# Patient Record
Sex: Female | Born: 1950 | Race: Black or African American | Hispanic: No | Marital: Single | Smoking: Former smoker
Health system: Southern US, Community
[De-identification: ages and names within clinical notes are randomized; demographics above are authoritative.]

## PROBLEM LIST (undated history)

## (undated) DIAGNOSIS — J4520 Mild intermittent asthma, uncomplicated: Secondary | ICD-10-CM

## (undated) DIAGNOSIS — Z87442 Personal history of urinary calculi: Secondary | ICD-10-CM

## (undated) DIAGNOSIS — I480 Paroxysmal atrial fibrillation: Secondary | ICD-10-CM

## (undated) DIAGNOSIS — Z7901 Long term (current) use of anticoagulants: Secondary | ICD-10-CM

## (undated) DIAGNOSIS — T7840XA Allergy, unspecified, initial encounter: Secondary | ICD-10-CM

## (undated) DIAGNOSIS — I10 Essential (primary) hypertension: Secondary | ICD-10-CM

## (undated) DIAGNOSIS — K5909 Other constipation: Secondary | ICD-10-CM

## (undated) DIAGNOSIS — I2510 Atherosclerotic heart disease of native coronary artery without angina pectoris: Secondary | ICD-10-CM

## (undated) DIAGNOSIS — L309 Dermatitis, unspecified: Secondary | ICD-10-CM

## (undated) DIAGNOSIS — I4891 Unspecified atrial fibrillation: Secondary | ICD-10-CM

## (undated) DIAGNOSIS — S36039A Unspecified laceration of spleen, initial encounter: Secondary | ICD-10-CM

## (undated) DIAGNOSIS — K219 Gastro-esophageal reflux disease without esophagitis: Secondary | ICD-10-CM

## (undated) HISTORY — DX: Mild intermittent asthma, uncomplicated: J45.20

## (undated) HISTORY — PX: TONSILLECTOMY: SUR1361

## (undated) HISTORY — DX: Allergy, unspecified, initial encounter: T78.40XA

## (undated) HISTORY — PX: TUBAL LIGATION: SHX77

## (undated) HISTORY — DX: Unspecified laceration of spleen, initial encounter: S36.039A

## (undated) HISTORY — DX: Other constipation: K59.09

---

## 1998-02-13 ENCOUNTER — Other Ambulatory Visit: Admission: RE | Admit: 1998-02-13 | Discharge: 1998-02-13 | Payer: Self-pay | Admitting: Internal Medicine

## 1998-02-13 ENCOUNTER — Encounter: Admission: RE | Admit: 1998-02-13 | Discharge: 1998-02-13 | Payer: Self-pay | Admitting: Internal Medicine

## 1998-09-18 ENCOUNTER — Emergency Department (HOSPITAL_COMMUNITY): Admission: EM | Admit: 1998-09-18 | Discharge: 1998-09-18 | Payer: Self-pay | Admitting: Emergency Medicine

## 1998-11-27 ENCOUNTER — Ambulatory Visit (HOSPITAL_COMMUNITY): Admission: RE | Admit: 1998-11-27 | Discharge: 1998-11-27 | Payer: Self-pay

## 1999-04-13 ENCOUNTER — Emergency Department (HOSPITAL_COMMUNITY): Admission: EM | Admit: 1999-04-13 | Discharge: 1999-04-13 | Payer: Self-pay | Admitting: Emergency Medicine

## 1999-11-15 ENCOUNTER — Encounter: Payer: Self-pay | Admitting: Emergency Medicine

## 1999-11-15 ENCOUNTER — Emergency Department (HOSPITAL_COMMUNITY): Admission: EM | Admit: 1999-11-15 | Discharge: 1999-11-15 | Payer: Self-pay | Admitting: Emergency Medicine

## 1999-11-17 ENCOUNTER — Encounter: Payer: Self-pay | Admitting: Internal Medicine

## 1999-11-17 ENCOUNTER — Ambulatory Visit (HOSPITAL_COMMUNITY): Admission: RE | Admit: 1999-11-17 | Discharge: 1999-11-17 | Payer: Self-pay | Admitting: Internal Medicine

## 1999-11-29 ENCOUNTER — Emergency Department (HOSPITAL_COMMUNITY): Admission: EM | Admit: 1999-11-29 | Discharge: 1999-11-29 | Payer: Self-pay | Admitting: Emergency Medicine

## 1999-11-29 ENCOUNTER — Encounter: Payer: Self-pay | Admitting: Emergency Medicine

## 2000-02-27 ENCOUNTER — Ambulatory Visit (HOSPITAL_COMMUNITY): Admission: RE | Admit: 2000-02-27 | Discharge: 2000-02-27 | Payer: Self-pay | Admitting: Internal Medicine

## 2000-02-27 ENCOUNTER — Encounter: Payer: Self-pay | Admitting: Internal Medicine

## 2002-04-08 ENCOUNTER — Emergency Department (HOSPITAL_COMMUNITY): Admission: EM | Admit: 2002-04-08 | Discharge: 2002-04-08 | Payer: Self-pay | Admitting: Emergency Medicine

## 2002-04-08 ENCOUNTER — Encounter: Payer: Self-pay | Admitting: Emergency Medicine

## 2002-09-02 ENCOUNTER — Encounter: Payer: Self-pay | Admitting: Emergency Medicine

## 2002-09-02 ENCOUNTER — Emergency Department (HOSPITAL_COMMUNITY): Admission: EM | Admit: 2002-09-02 | Discharge: 2002-09-02 | Payer: Self-pay | Admitting: Emergency Medicine

## 2002-10-02 ENCOUNTER — Encounter: Payer: Self-pay | Admitting: Emergency Medicine

## 2002-10-02 ENCOUNTER — Emergency Department (HOSPITAL_COMMUNITY): Admission: EM | Admit: 2002-10-02 | Discharge: 2002-10-02 | Payer: Self-pay | Admitting: Emergency Medicine

## 2002-11-17 ENCOUNTER — Emergency Department (HOSPITAL_COMMUNITY): Admission: EM | Admit: 2002-11-17 | Discharge: 2002-11-17 | Payer: Self-pay | Admitting: *Deleted

## 2002-12-25 ENCOUNTER — Emergency Department (HOSPITAL_COMMUNITY): Admission: EM | Admit: 2002-12-25 | Discharge: 2002-12-25 | Payer: Self-pay | Admitting: Emergency Medicine

## 2003-01-10 ENCOUNTER — Emergency Department (HOSPITAL_COMMUNITY): Admission: EM | Admit: 2003-01-10 | Discharge: 2003-01-10 | Payer: Self-pay | Admitting: Emergency Medicine

## 2003-01-13 ENCOUNTER — Emergency Department (HOSPITAL_COMMUNITY): Admission: EM | Admit: 2003-01-13 | Discharge: 2003-01-13 | Payer: Self-pay | Admitting: *Deleted

## 2003-01-19 ENCOUNTER — Emergency Department (HOSPITAL_COMMUNITY): Admission: EM | Admit: 2003-01-19 | Discharge: 2003-01-19 | Payer: Self-pay | Admitting: Emergency Medicine

## 2003-01-19 ENCOUNTER — Encounter: Payer: Self-pay | Admitting: Emergency Medicine

## 2003-02-09 ENCOUNTER — Emergency Department (HOSPITAL_COMMUNITY): Admission: EM | Admit: 2003-02-09 | Discharge: 2003-02-09 | Payer: Self-pay | Admitting: Emergency Medicine

## 2003-04-13 ENCOUNTER — Encounter: Payer: Self-pay | Admitting: Emergency Medicine

## 2003-04-13 ENCOUNTER — Emergency Department (HOSPITAL_COMMUNITY): Admission: EM | Admit: 2003-04-13 | Discharge: 2003-04-13 | Payer: Self-pay | Admitting: *Deleted

## 2003-04-30 ENCOUNTER — Encounter: Payer: Self-pay | Admitting: Internal Medicine

## 2003-04-30 ENCOUNTER — Inpatient Hospital Stay (HOSPITAL_COMMUNITY): Admission: EM | Admit: 2003-04-30 | Discharge: 2003-05-02 | Payer: Self-pay | Admitting: Emergency Medicine

## 2003-04-30 ENCOUNTER — Encounter: Payer: Self-pay | Admitting: Emergency Medicine

## 2003-11-23 ENCOUNTER — Emergency Department (HOSPITAL_COMMUNITY): Admission: EM | Admit: 2003-11-23 | Discharge: 2003-11-23 | Payer: Self-pay | Admitting: Emergency Medicine

## 2004-05-28 ENCOUNTER — Ambulatory Visit: Payer: Self-pay | Admitting: *Deleted

## 2004-08-11 ENCOUNTER — Ambulatory Visit: Payer: Self-pay | Admitting: Nurse Practitioner

## 2004-11-09 ENCOUNTER — Emergency Department (HOSPITAL_COMMUNITY): Admission: EM | Admit: 2004-11-09 | Discharge: 2004-11-09 | Payer: Self-pay | Admitting: Emergency Medicine

## 2004-11-10 ENCOUNTER — Ambulatory Visit: Payer: Self-pay | Admitting: Nurse Practitioner

## 2004-11-10 ENCOUNTER — Ambulatory Visit: Payer: Self-pay | Admitting: Internal Medicine

## 2005-04-01 ENCOUNTER — Emergency Department (HOSPITAL_COMMUNITY): Admission: EM | Admit: 2005-04-01 | Discharge: 2005-04-01 | Payer: Self-pay | Admitting: Emergency Medicine

## 2005-04-05 ENCOUNTER — Ambulatory Visit: Payer: Self-pay | Admitting: Nurse Practitioner

## 2005-09-27 ENCOUNTER — Emergency Department (HOSPITAL_COMMUNITY): Admission: EM | Admit: 2005-09-27 | Discharge: 2005-09-27 | Payer: Self-pay | Admitting: Emergency Medicine

## 2005-10-02 ENCOUNTER — Emergency Department (HOSPITAL_COMMUNITY): Admission: EM | Admit: 2005-10-02 | Discharge: 2005-10-02 | Payer: Self-pay | Admitting: Emergency Medicine

## 2005-11-20 ENCOUNTER — Emergency Department (HOSPITAL_COMMUNITY): Admission: EM | Admit: 2005-11-20 | Discharge: 2005-11-20 | Payer: Self-pay | Admitting: Emergency Medicine

## 2005-12-08 ENCOUNTER — Ambulatory Visit: Payer: Self-pay | Admitting: Nurse Practitioner

## 2005-12-17 ENCOUNTER — Emergency Department (HOSPITAL_COMMUNITY): Admission: EM | Admit: 2005-12-17 | Discharge: 2005-12-17 | Payer: Self-pay | Admitting: Emergency Medicine

## 2005-12-22 ENCOUNTER — Ambulatory Visit: Payer: Self-pay | Admitting: Nurse Practitioner

## 2005-12-28 ENCOUNTER — Ambulatory Visit: Payer: Self-pay | Admitting: Nurse Practitioner

## 2006-02-15 ENCOUNTER — Emergency Department (HOSPITAL_COMMUNITY): Admission: EM | Admit: 2006-02-15 | Discharge: 2006-02-15 | Payer: Self-pay | Admitting: Emergency Medicine

## 2006-02-25 IMAGING — CT CT ABDOMEN W/ CM
1 of 3 series · 14 of 32 positions shown, 19 images · IV contrast (omnipaque)
Comparison: None.
ABDOMEN CT WITH CONTRAST:

CLINICAL DATA: 53 year-old female with nausea, vomiting, diarrhea and lower abdominal pain.
TECHNIQUE: 125 cc Omnipaque 300 contrast administered intravenously with multi-detector helical imaging performed of the abdomen and pelvis.  Oral contrast was also administered.

[Series 2: abd_pel 5.0 b45f st · axial · 0.55mm/px · z∈[-448,-108]mm · 14 of 78 slices shown, 19 images]
[im 5/78  soft-tissue]
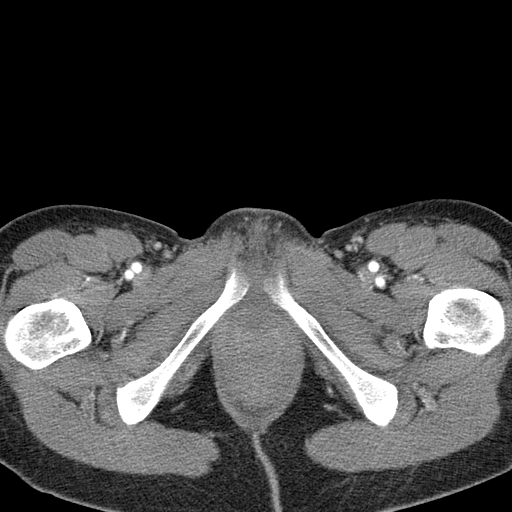
[im 5/78  bone]
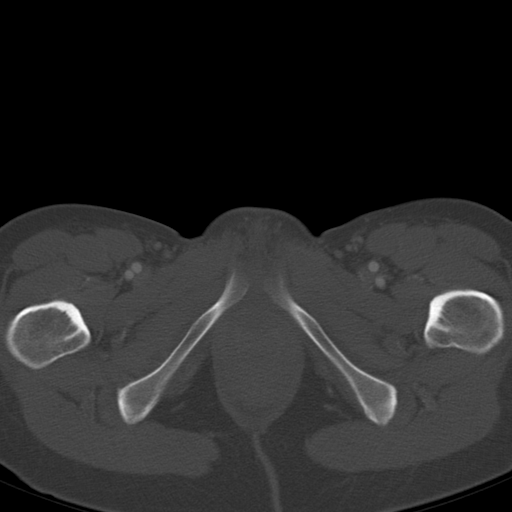
[im 13/78  soft-tissue]
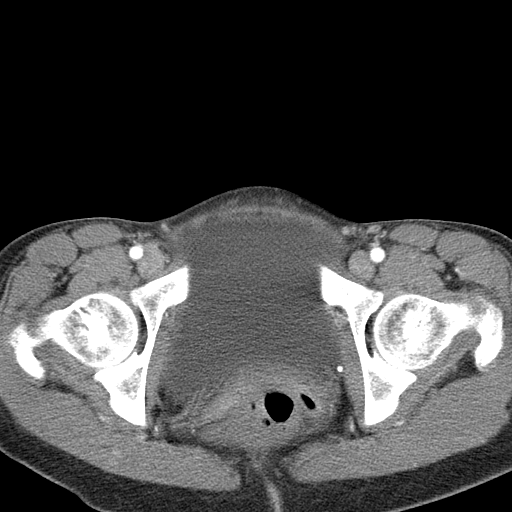
[im 17/78  soft-tissue]
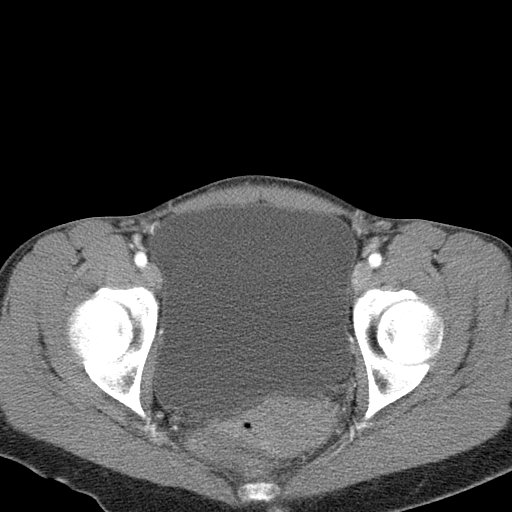
[im 21/78  soft-tissue]
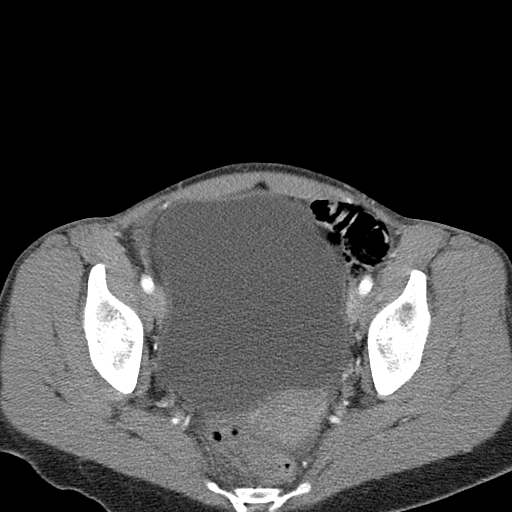
[im 29/78  soft-tissue]
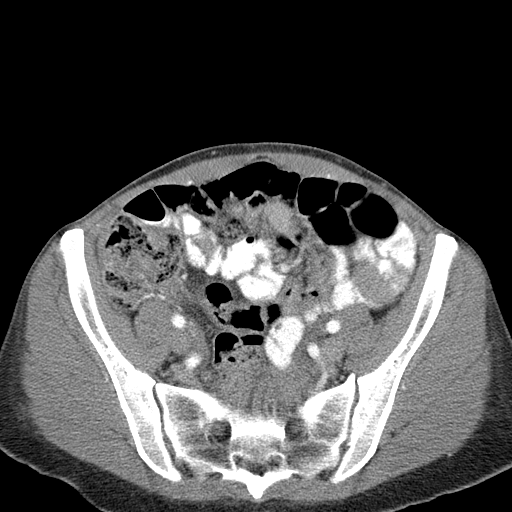
[im 33/78  soft-tissue]
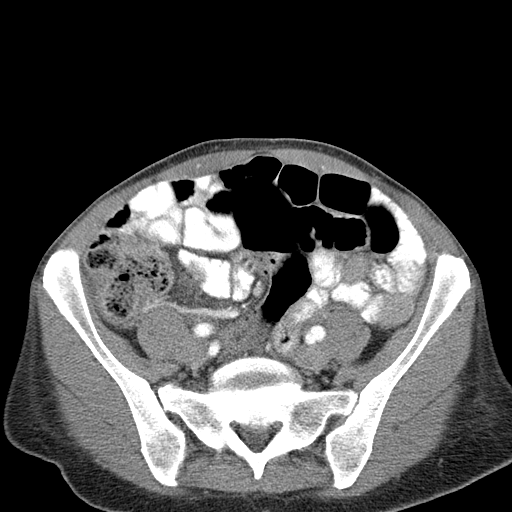
[im 41/78  soft-tissue]
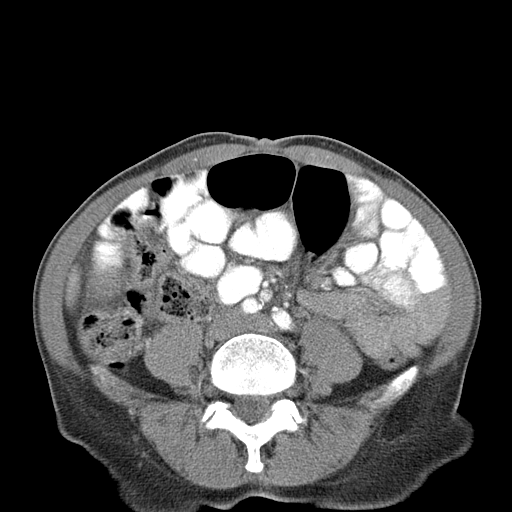
[im 45/78  soft-tissue]
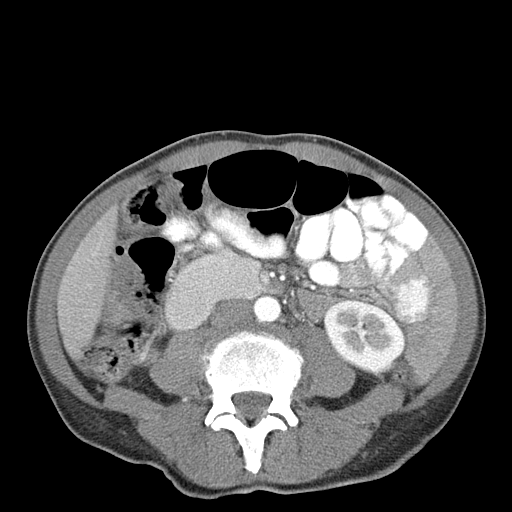
[im 49/78  soft-tissue]
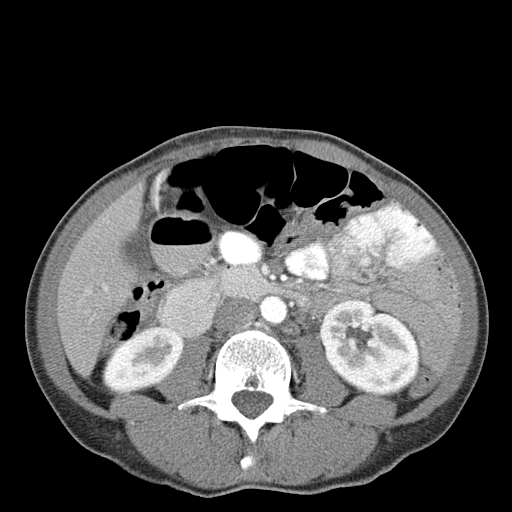
[im 49/78  bone]
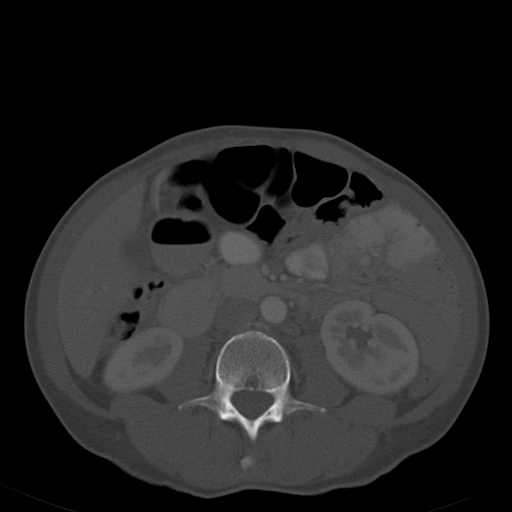
[im 57/78  soft-tissue]
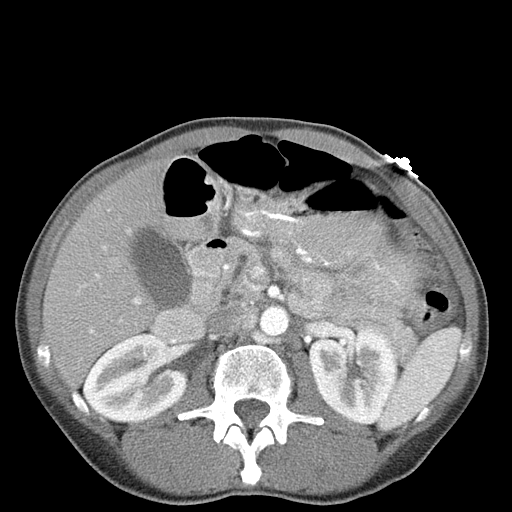
[im 61/78  soft-tissue]
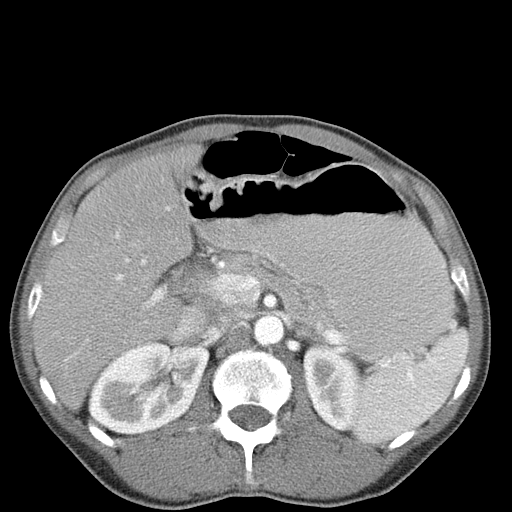
[im 61/78  lung]
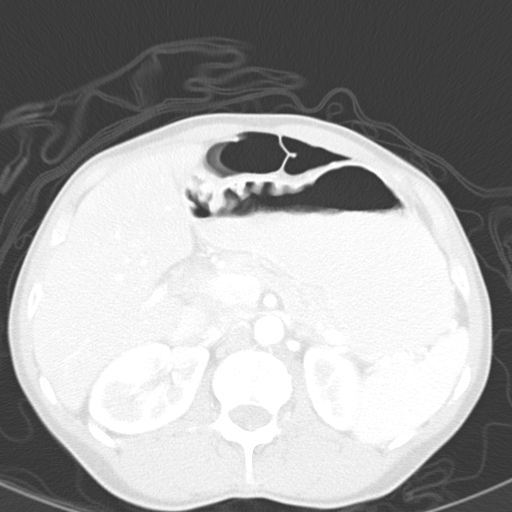
[im 65/78  soft-tissue]
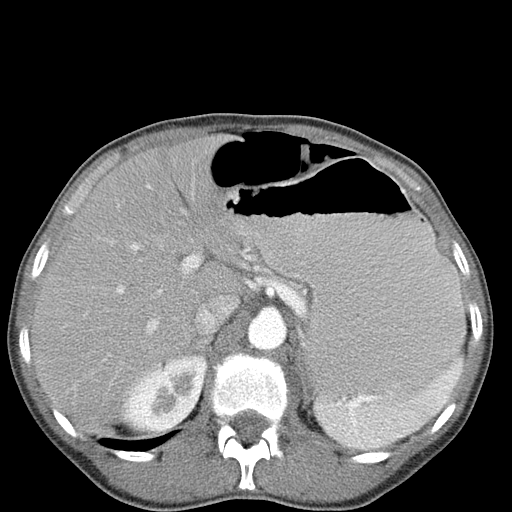
[im 65/78  lung]
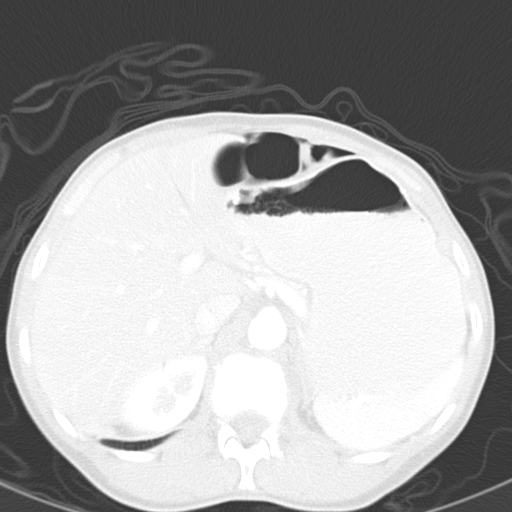
[im 69/78  lung]
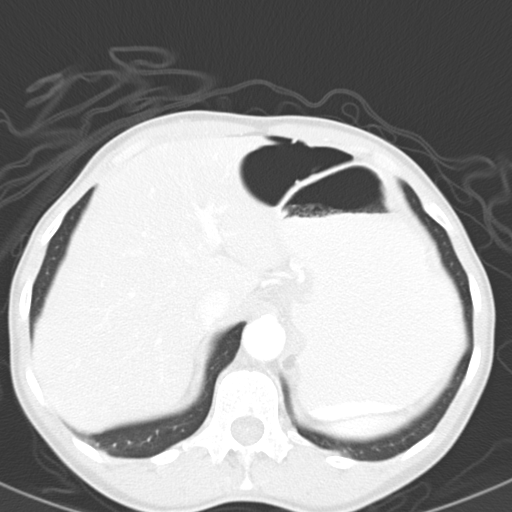
[im 73/78  soft-tissue]
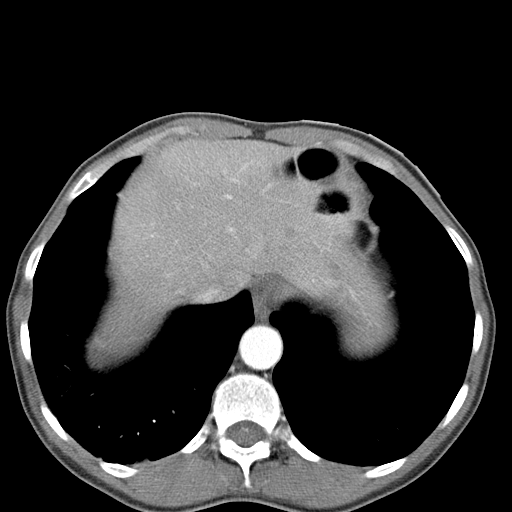
[im 73/78  lung]
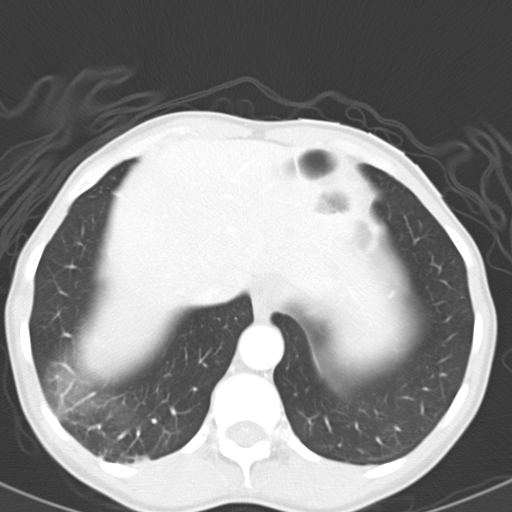

[14 of 32 positions shown; findings below may reference images not displayed]

FINDINGS: Minimal right lower lobe dependent atelectasis.  No pleural or pericardial fluid.  Heart size is normal. In the left hepatic dome, there are two subcentimeter low density lesions noted, images 4 and 7 most likely small hepatic cysts, but too small to definitively characterize.  Also along the falciform ligament, images 15 and 16, there is low attenuation in a subcapsular location most consistent with focal fatty infiltration in this location. No biliary dilatation or obstruction. Hepatic and portal veins are patent.  The gallbladder, spleen, kidneys, adrenal glands, and pancreas are within normal limits.  There is mild prominence of the pancreatic ductal system without dilatation or acute inflammation.  The pancreatic ductal system is likely divided in the head region consistent with a pancreatic divisum, normal variant. The stomach is mildly distended with an air fluid level.  Transverse colon is also air-distended with retained stool.  No definite bowel obstruction, bowel wall thickening, or free air.  In the right lower quadrant , the appendix is likely air-filled without acute inflammation or wall thickening.
IMPRESSION: 1.  No definite acute finding in the abdomen.
2.  Nonspecific air distention of the transverse colon without obstruction or free air.
CT PELVIS WITH CONTRAST:
The bladder is markedly distended.  No abdominal or pelvic adenopathy.  No definite free fluid.
IMPRESSION: Markedly distended bladder in the pelvis.  Otherwise no acute finding.

## 2006-05-28 ENCOUNTER — Emergency Department (HOSPITAL_COMMUNITY): Admission: EM | Admit: 2006-05-28 | Discharge: 2006-05-28 | Payer: Self-pay | Admitting: Emergency Medicine

## 2006-06-07 ENCOUNTER — Emergency Department (HOSPITAL_COMMUNITY): Admission: EM | Admit: 2006-06-07 | Discharge: 2006-06-07 | Payer: Self-pay | Admitting: *Deleted

## 2006-08-11 ENCOUNTER — Ambulatory Visit: Payer: Self-pay | Admitting: Nurse Practitioner

## 2006-09-06 ENCOUNTER — Ambulatory Visit: Payer: Self-pay | Admitting: Nurse Practitioner

## 2006-09-27 ENCOUNTER — Ambulatory Visit: Payer: Self-pay | Admitting: Family Medicine

## 2006-10-13 ENCOUNTER — Emergency Department (HOSPITAL_COMMUNITY): Admission: EM | Admit: 2006-10-13 | Discharge: 2006-10-13 | Payer: Self-pay | Admitting: Pediatrics

## 2006-10-16 ENCOUNTER — Inpatient Hospital Stay (HOSPITAL_COMMUNITY): Admission: EM | Admit: 2006-10-16 | Discharge: 2006-10-18 | Payer: Self-pay | Admitting: Emergency Medicine

## 2006-10-21 ENCOUNTER — Ambulatory Visit: Payer: Self-pay | Admitting: Nurse Practitioner

## 2007-02-01 ENCOUNTER — Emergency Department (HOSPITAL_COMMUNITY): Admission: EM | Admit: 2007-02-01 | Discharge: 2007-02-01 | Payer: Self-pay | Admitting: Emergency Medicine

## 2007-02-02 ENCOUNTER — Emergency Department (HOSPITAL_COMMUNITY): Admission: EM | Admit: 2007-02-02 | Discharge: 2007-02-02 | Payer: Self-pay | Admitting: Emergency Medicine

## 2007-03-08 IMAGING — CR DG CHEST 2V
2 series · 2 of 2 positions shown · non-contrast
Comparison: none available.

CLINICAL DATA: Weakness.  Chest pain.  
 [id number] VIEWS:

[w chest pa]
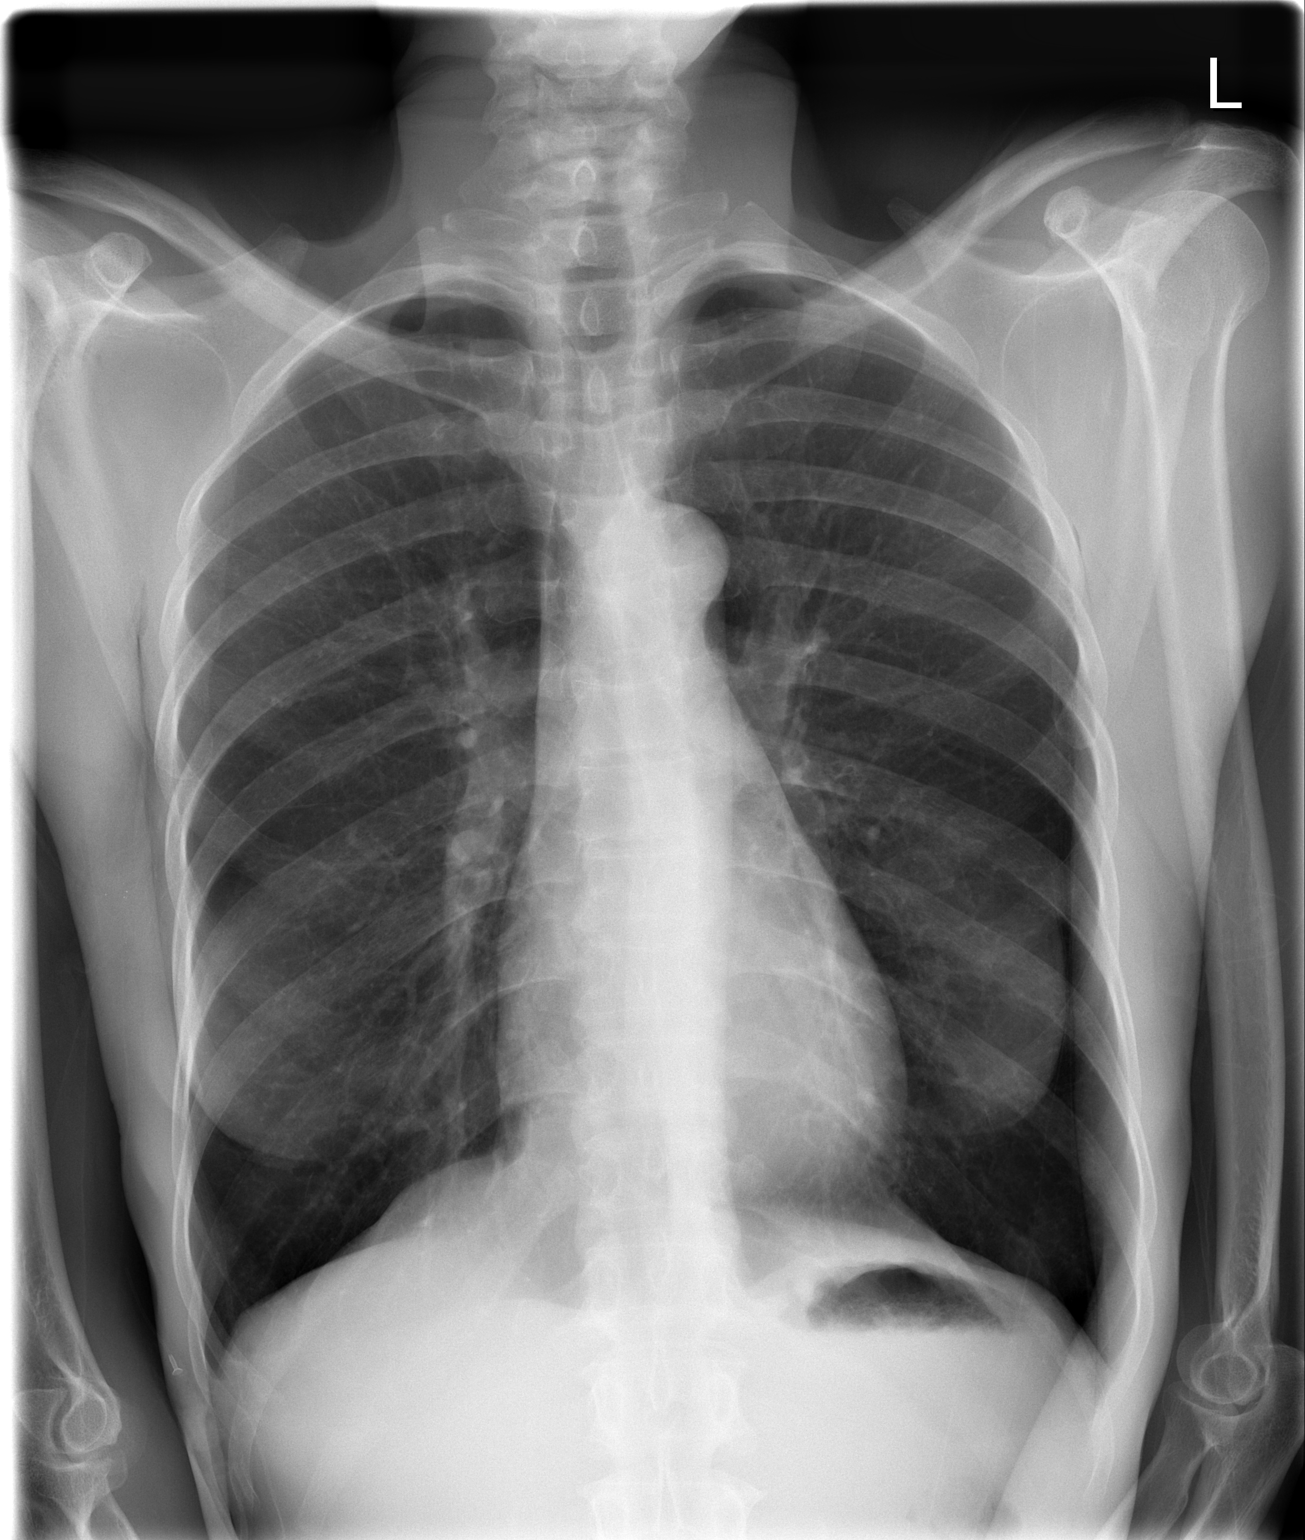

[w chest lat]
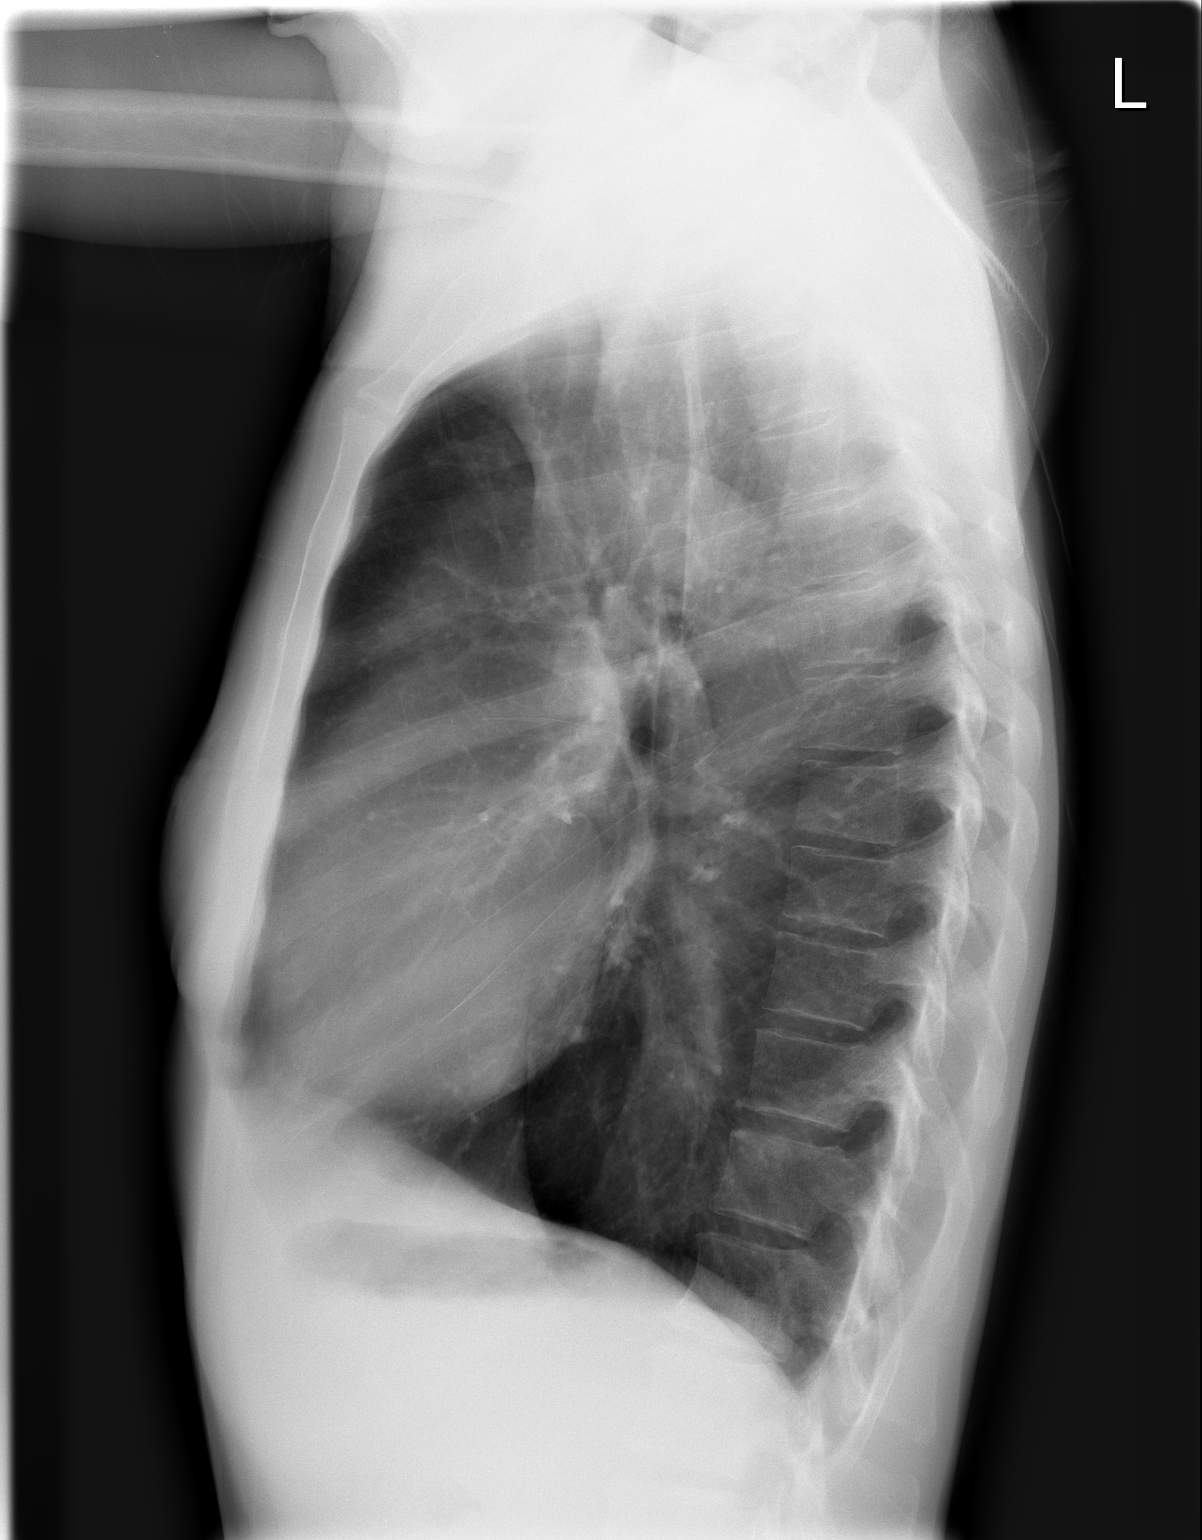

[2 of 2 positions shown; findings below may reference images not displayed]

Report from 12/30/02 is available.
 Marked hyperinflation consistent with COPD.  No infiltrates, failure or nodules.  Cardiac size normal.  Bones unremarkable.  Little change by report from prior exam.
IMPRESSION: COPD, no active disease.

## 2007-04-04 IMAGING — CR DG KNEE 1-2V*L*
2 series · 2 of 2 positions shown · non-contrast
Comparison: None.

CLINICAL DATA: Aching left knee joint.  No injury. 
 LEFT KNEE - 2 VIEW:

[t knee ap left]
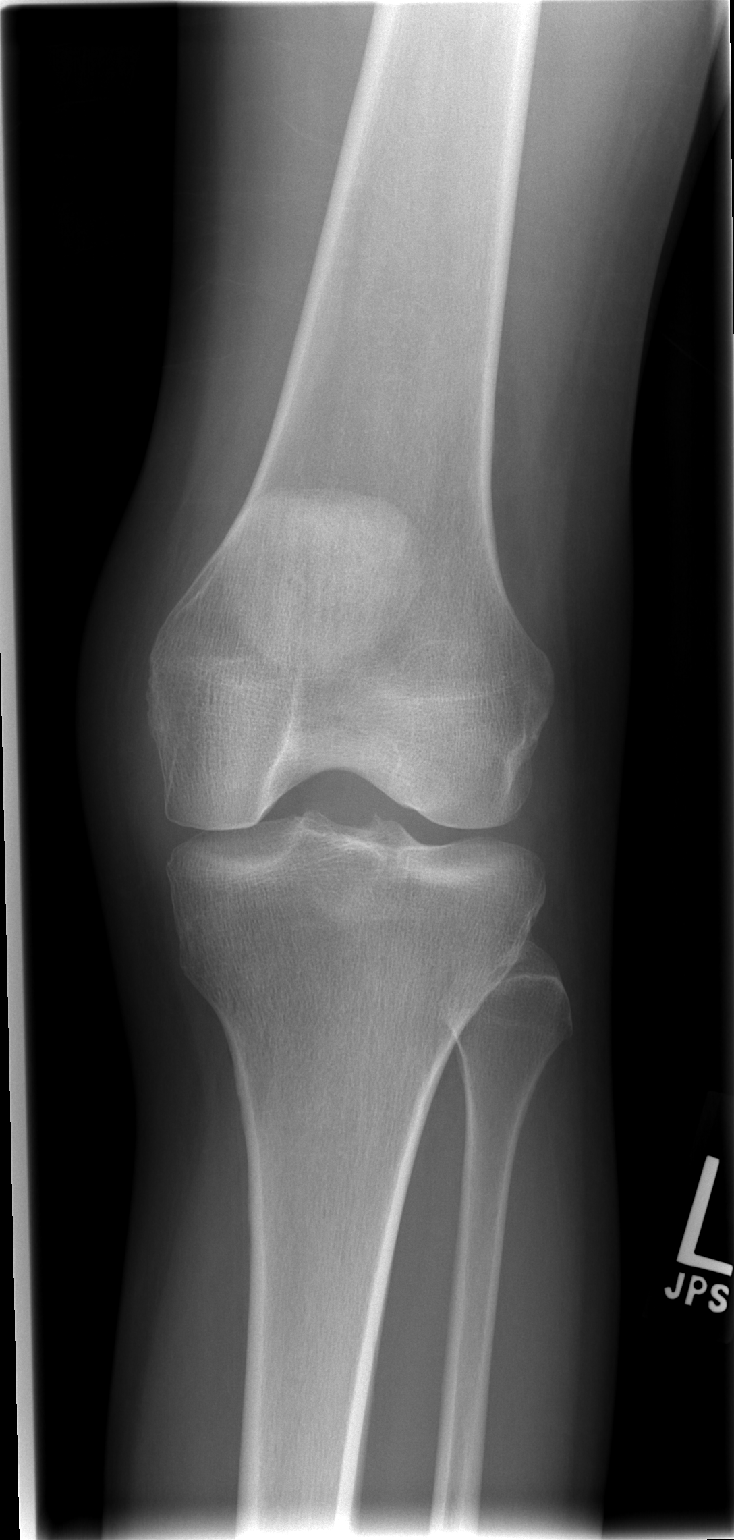

[t knee lat left]
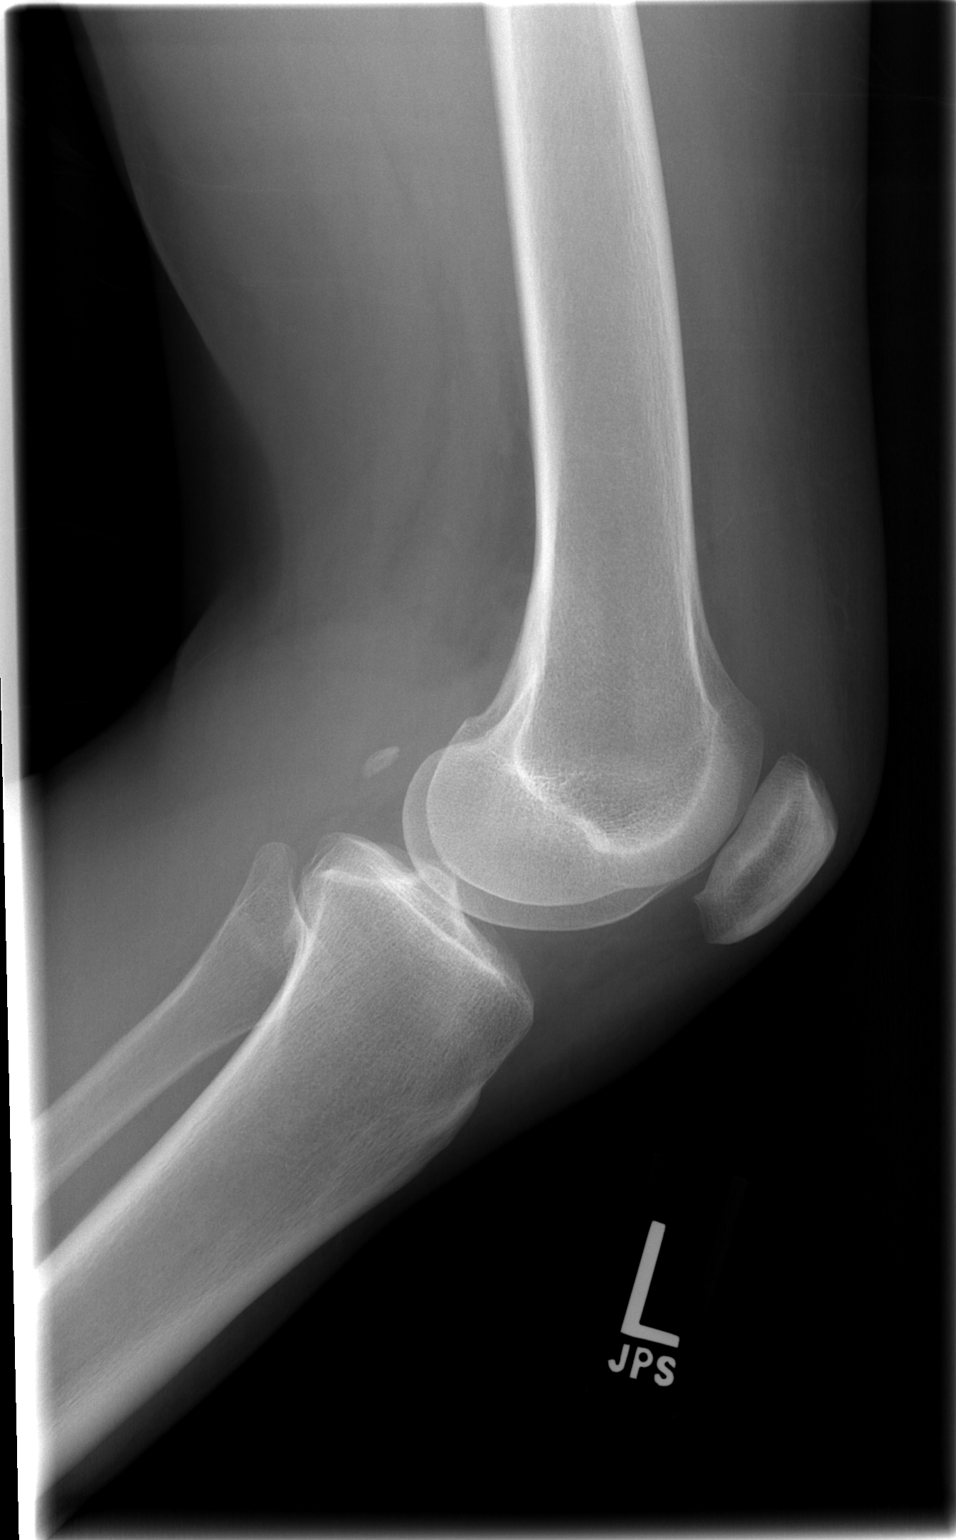

[2 of 2 positions shown; findings below may reference images not displayed]

FINDINGS: There is a small suprapatellar joint effusion.  There is mild degenerative changes identified with sharpening of the tibial spine.
IMPRESSION: 1.  Small suprapatellar joint effusion. 
 2.  Mild degenerative joint disease.

## 2007-04-11 DIAGNOSIS — R634 Abnormal weight loss: Secondary | ICD-10-CM

## 2007-05-10 ENCOUNTER — Encounter (INDEPENDENT_AMBULATORY_CARE_PROVIDER_SITE_OTHER): Payer: Self-pay | Admitting: *Deleted

## 2007-09-18 ENCOUNTER — Emergency Department (HOSPITAL_COMMUNITY): Admission: EM | Admit: 2007-09-18 | Discharge: 2007-09-18 | Payer: Self-pay | Admitting: Emergency Medicine

## 2008-01-16 ENCOUNTER — Encounter (INDEPENDENT_AMBULATORY_CARE_PROVIDER_SITE_OTHER): Payer: Self-pay | Admitting: Nurse Practitioner

## 2008-01-16 ENCOUNTER — Ambulatory Visit: Payer: Self-pay | Admitting: Family Medicine

## 2008-01-16 LAB — CONVERTED CEMR LAB
Albumin: 4.4 g/dL (ref 3.5–5.2)
BUN: 14 mg/dL (ref 6–23)
Calcium: 9.4 mg/dL (ref 8.4–10.5)
Chloride: 104 meq/L (ref 96–112)
Glucose, Bld: 86 mg/dL (ref 70–99)
Lymphs Abs: 2.1 10*3/uL (ref 0.7–4.0)
Monocytes Relative: 8 % (ref 3–12)
Neutro Abs: 2.1 10*3/uL (ref 1.7–7.7)
Neutrophils Relative %: 41 % — ABNORMAL LOW (ref 43–77)
Potassium: 3.7 meq/L (ref 3.5–5.3)
RBC: 4.67 M/uL (ref 3.87–5.11)
WBC: 5.2 10*3/uL (ref 4.0–10.5)

## 2008-01-29 IMAGING — CR DG ABDOMEN ACUTE W/ 1V CHEST
3 series · 3 of 3 positions shown · non-contrast
Comparison: none

CLINICAL DATA: Chest pain.  Cough.  Abdominal pain.  ER patient.  
 ACUTE ABDOMINAL SERIES - 3 VIEW:

[w chest pa]
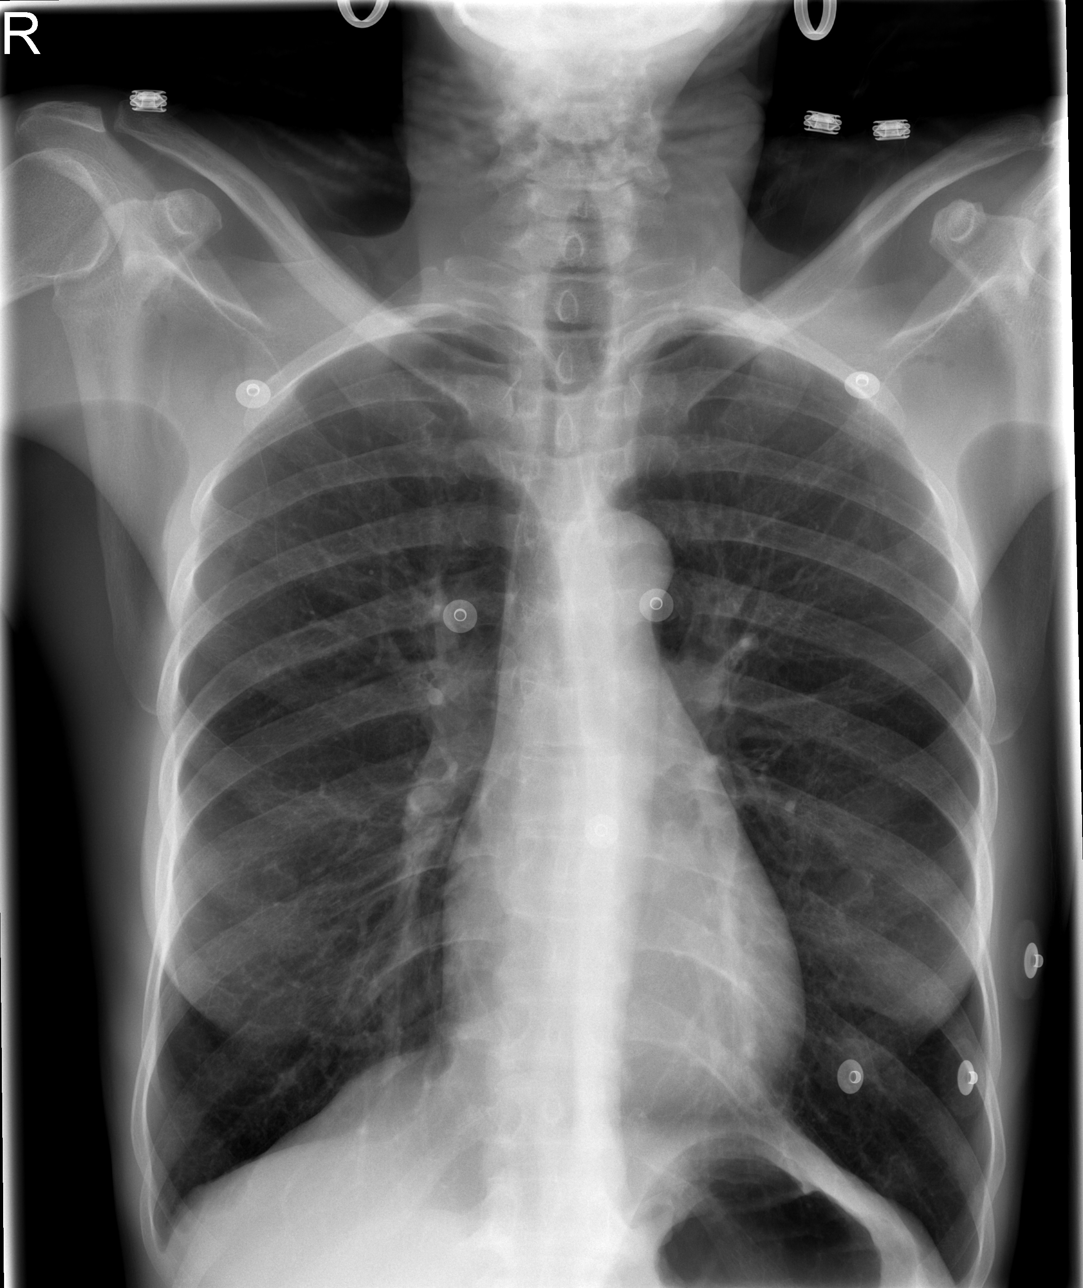

[w abdomen upright]
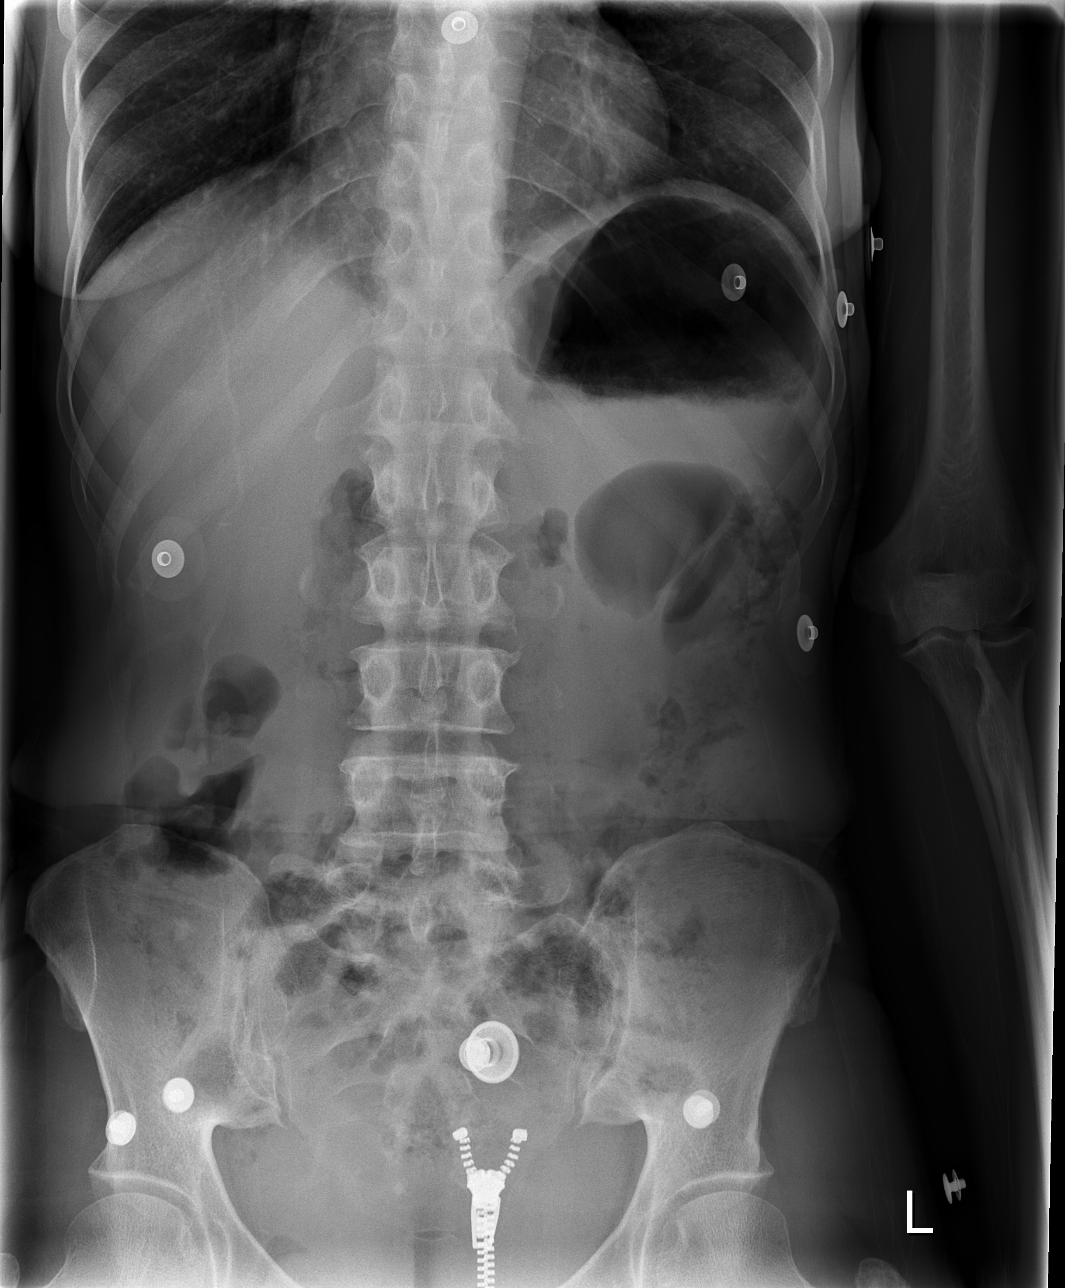

[t abdomen supine]
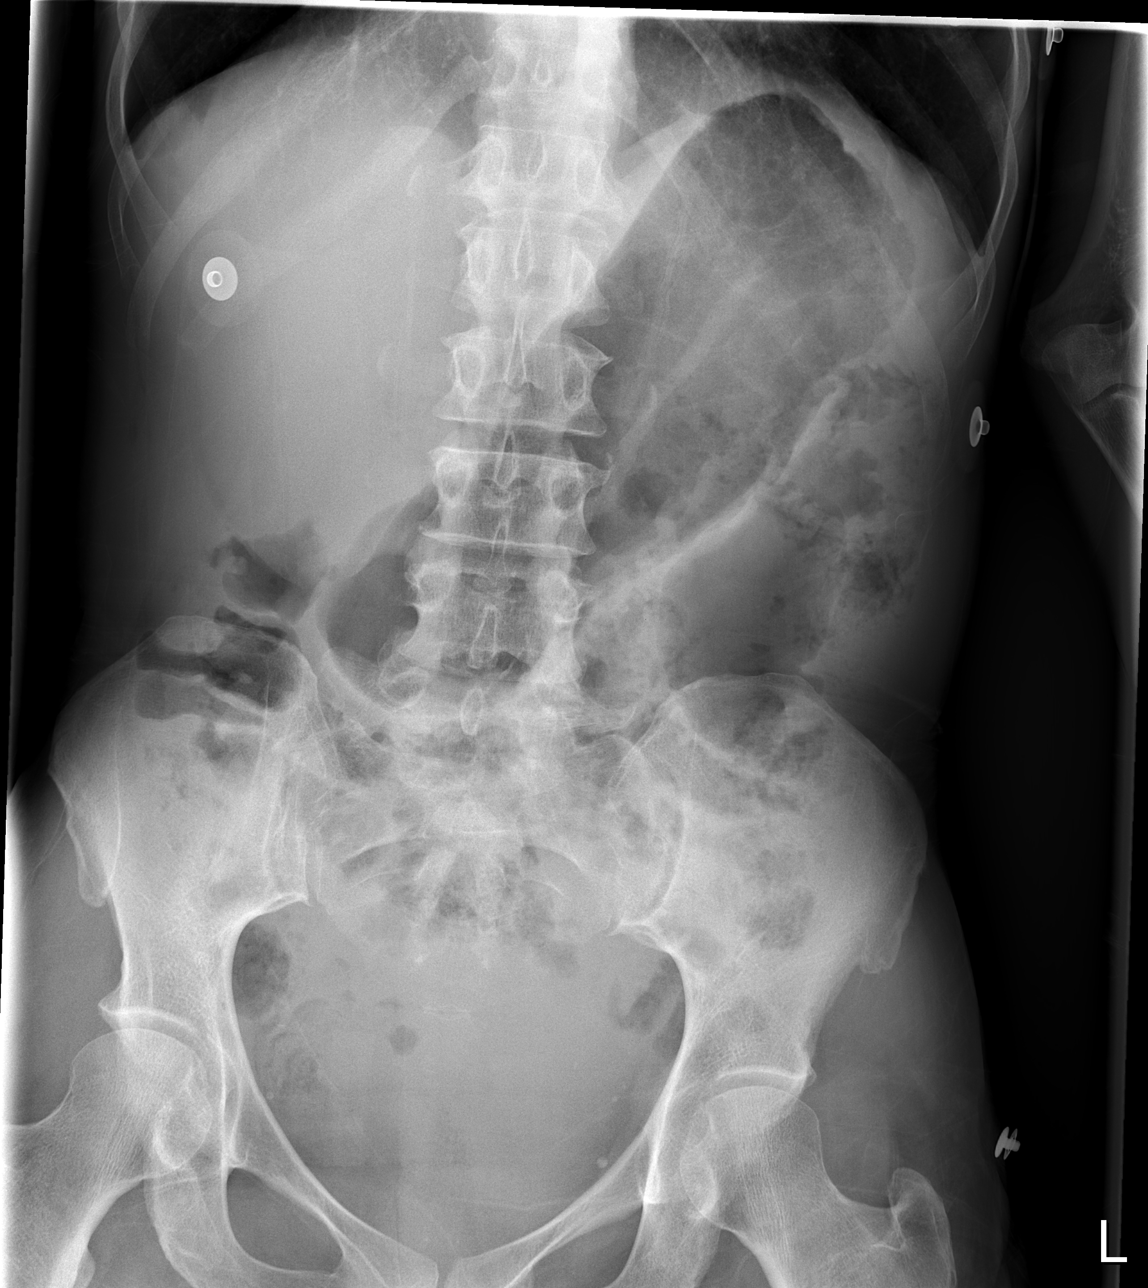

[3 of 3 positions shown; findings below may reference images not displayed]

FINDINGS: Hyperaeration of the lungs.  Lungs are clear of an active process.  Cardiomediastinal silhouette appears unremarkable.  Chest appears unchanged when compared to 11/20/05.   Stomach is distended.  Generous amount of stool in the colon.  Negative for bowel obstruction.  Minimal small bowel gas without distention.
IMPRESSION: Distended stomach.  Suspicion for constipation.

## 2008-02-01 IMAGING — CR DG CHEST 2V
2 series · 2 of 2 positions shown · non-contrast
Comparison: 11/20/05.

CLINICAL DATA: Cough, chest congestion. 
 CHEST ? 2 VIEW ? 10/16/06:

[w chest pa]
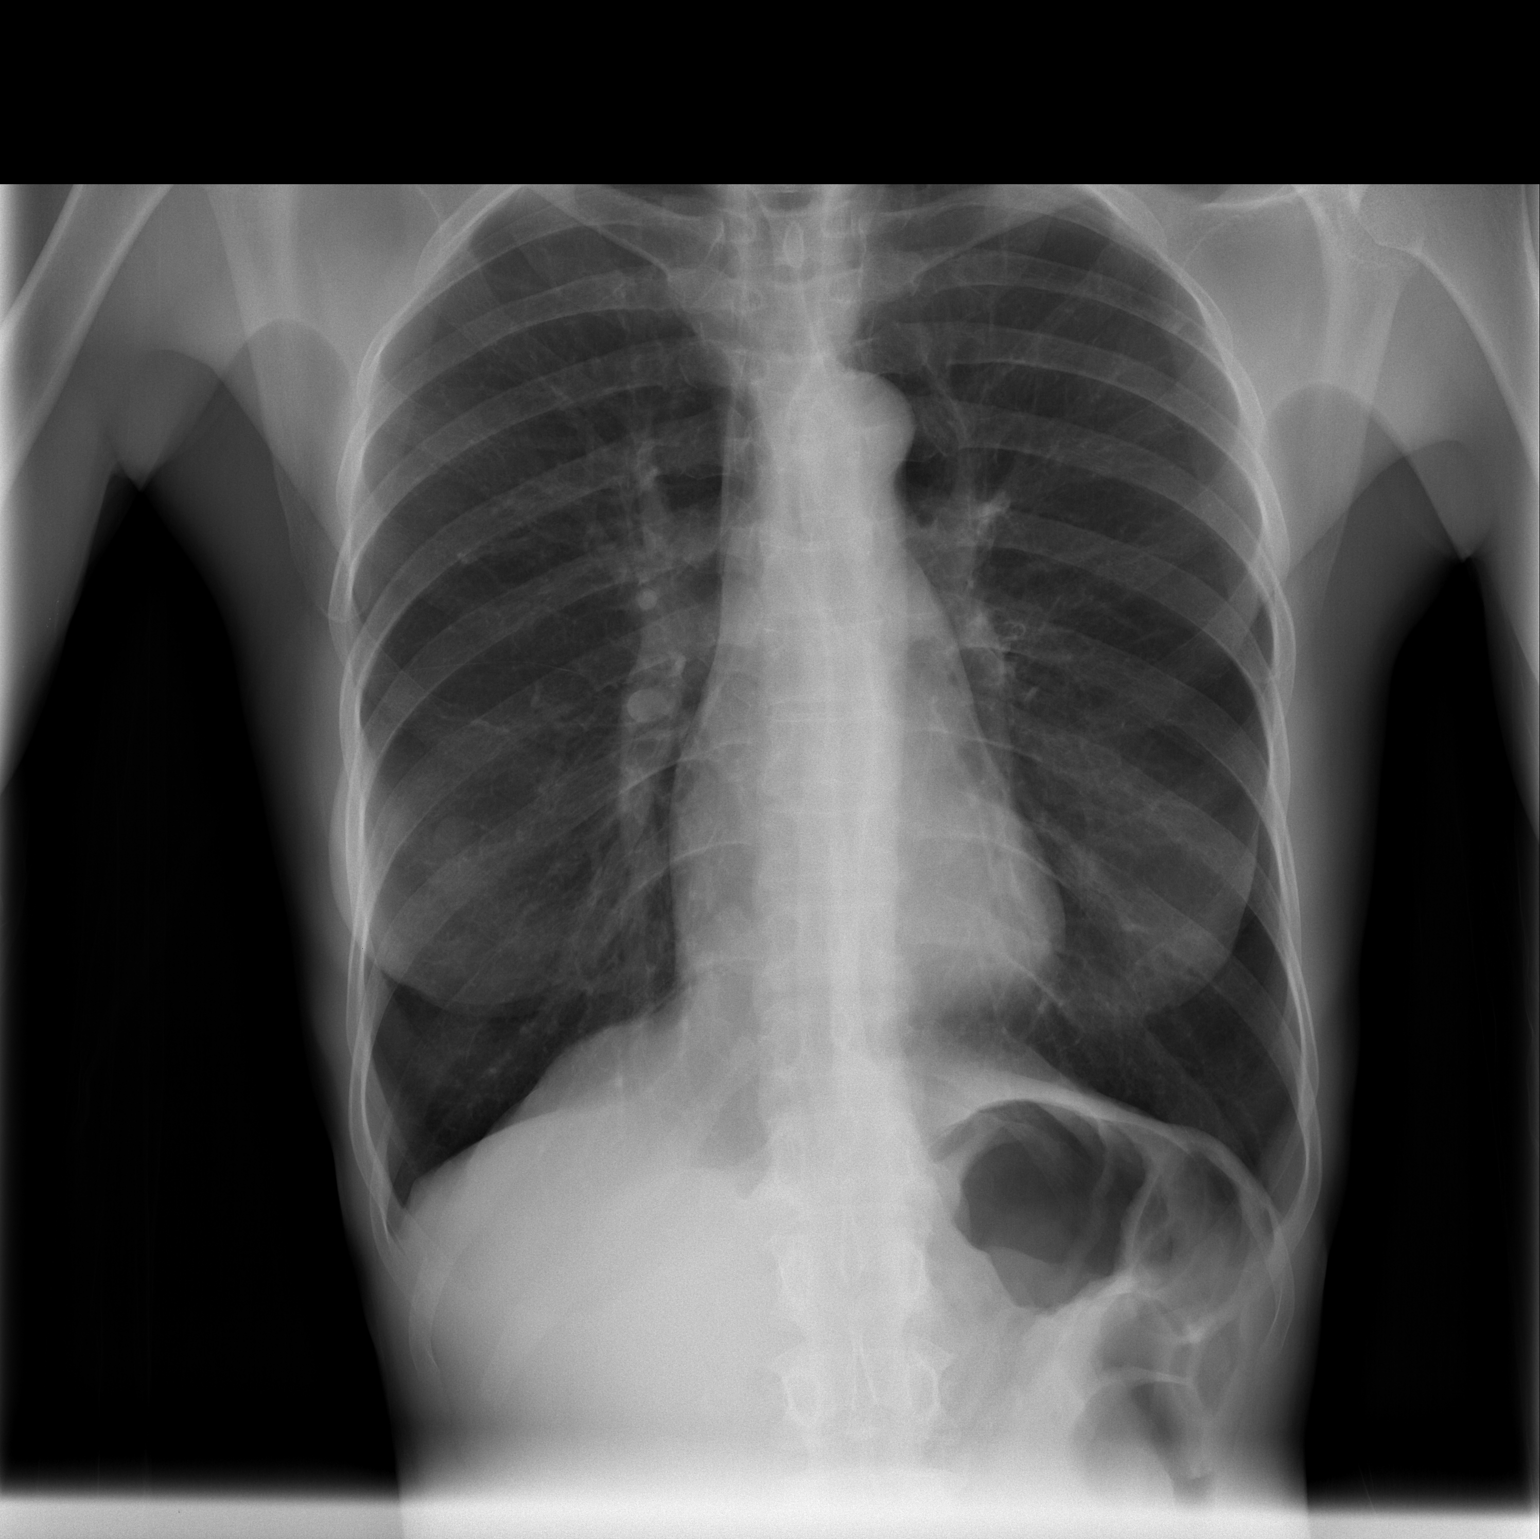

[w chest lat]
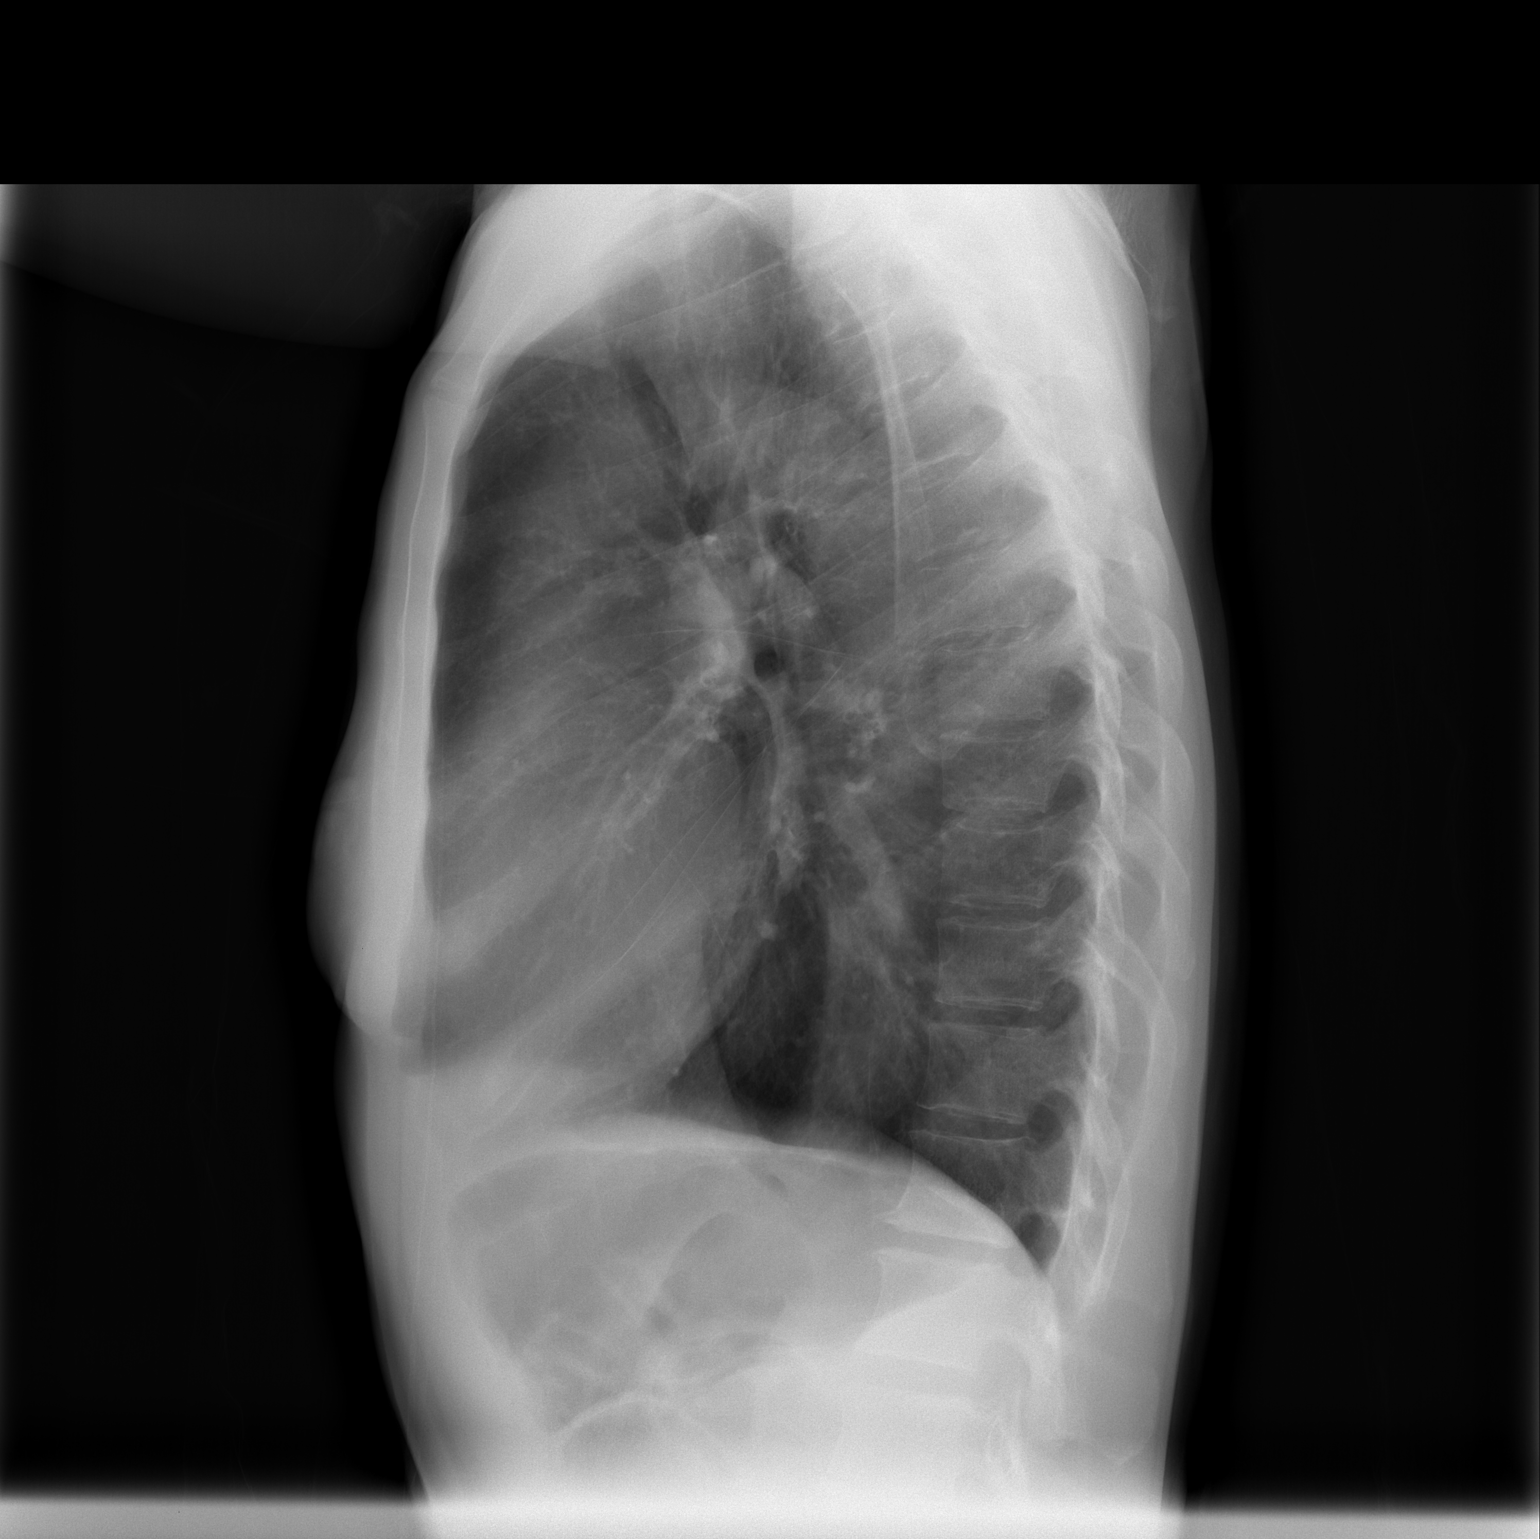

[2 of 2 positions shown; findings below may reference images not displayed]

FINDINGS: The lungs are hyperinflated consistent with emphysema.  The heart size is normal.  No pulmonary infiltrate.  No effusions.  Bony structures are unremarkable.
IMPRESSION: COPD.  No active disease.

## 2008-04-28 ENCOUNTER — Emergency Department (HOSPITAL_COMMUNITY): Admission: EM | Admit: 2008-04-28 | Discharge: 2008-04-28 | Payer: Self-pay | Admitting: Emergency Medicine

## 2008-05-19 IMAGING — CT CT ABDOMEN W/O CM
2 of 3 series · 14 of 32 positions shown, 19 images · IV contrast (agent unspecified)
Comparison: 11/09/2004

ABDOMEN CT WITHOUT CONTRAST

CLINICAL DATA: Pelvic pain and pressure
TECHNIQUE: Multidetector CT imaging of the abdomen and pelvis was performed
following the standard renal stone protocol

Contrast:  None

[Series 2: routine abdomen · axial · 0.54mm/px · z∈[-328,-85]mm · 7 of 67 slices shown, 12 images]
[im 9/67  soft-tissue]
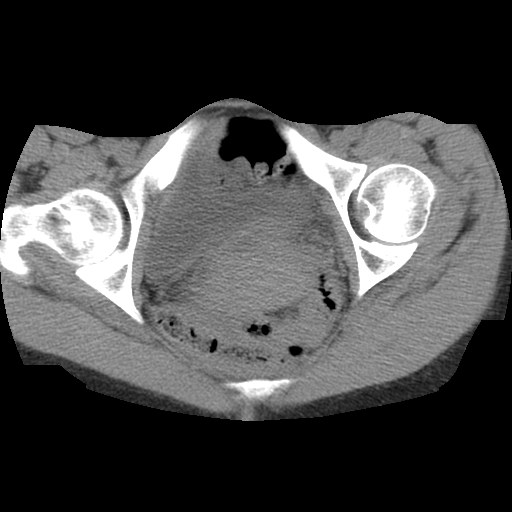
[im 9/67  bone]
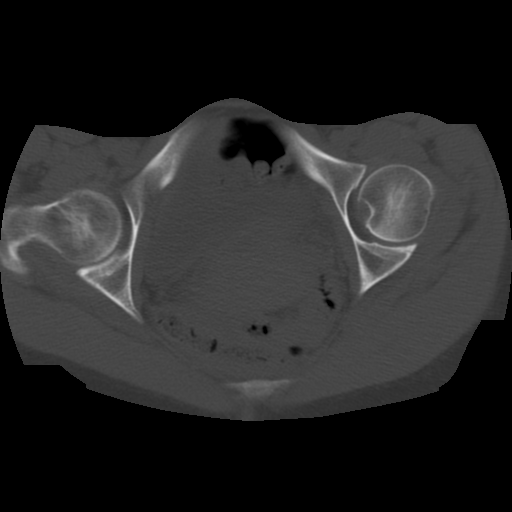
[im 17/67  soft-tissue]
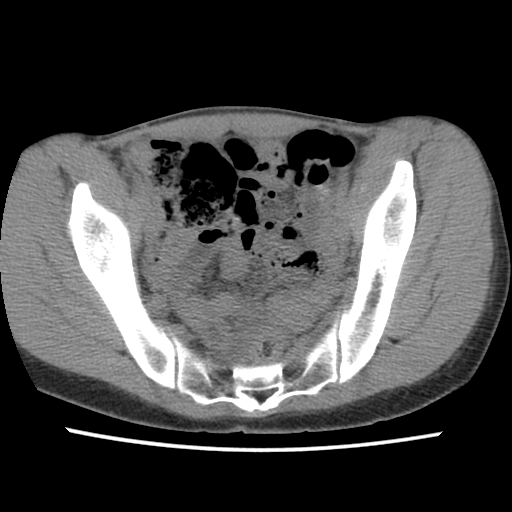
[im 25/67  soft-tissue]
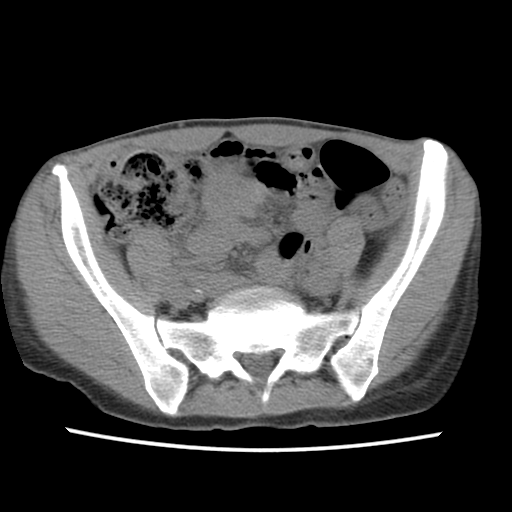
[im 34/67  soft-tissue]
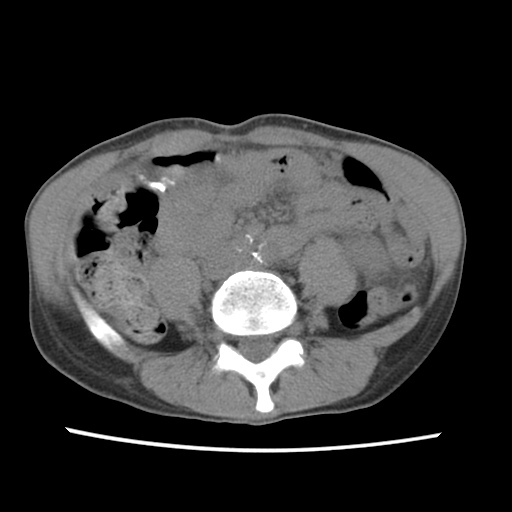
[im 34/67  lung]
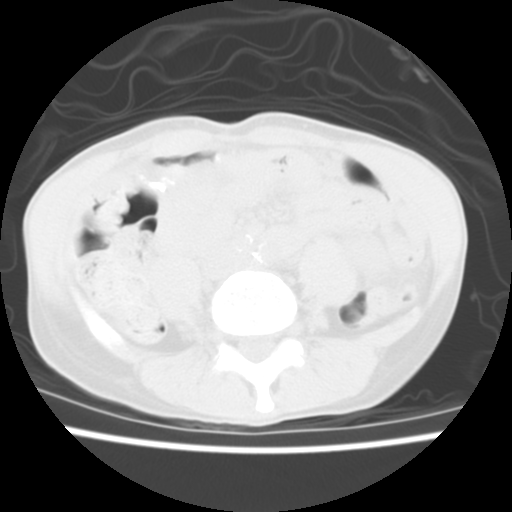
[im 42/67  soft-tissue]
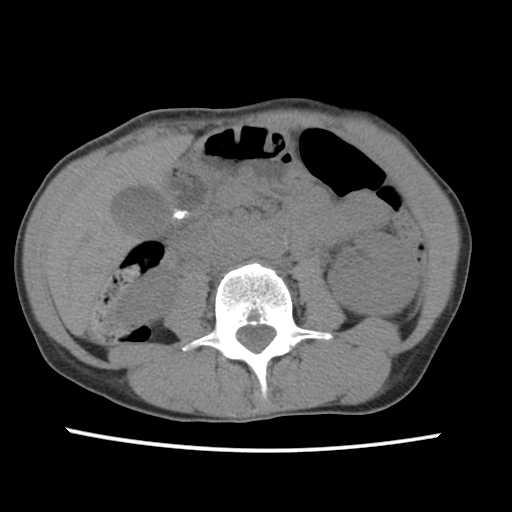
[im 42/67  lung]
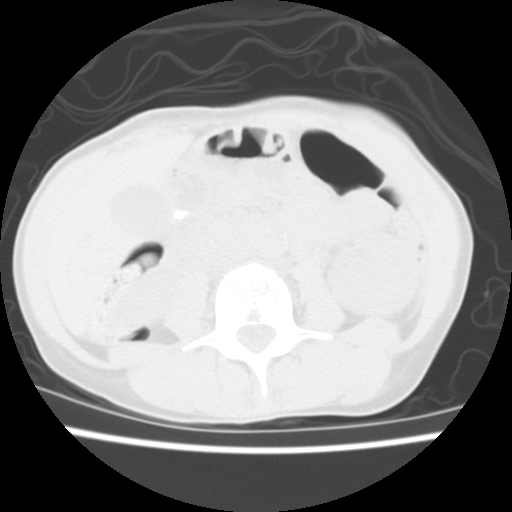
[im 50/67  soft-tissue]
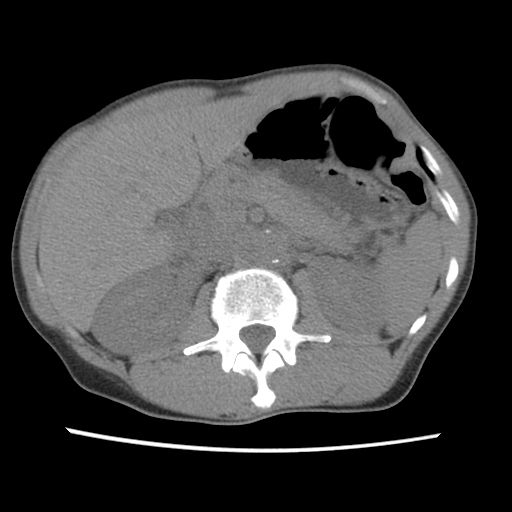
[im 50/67  lung]
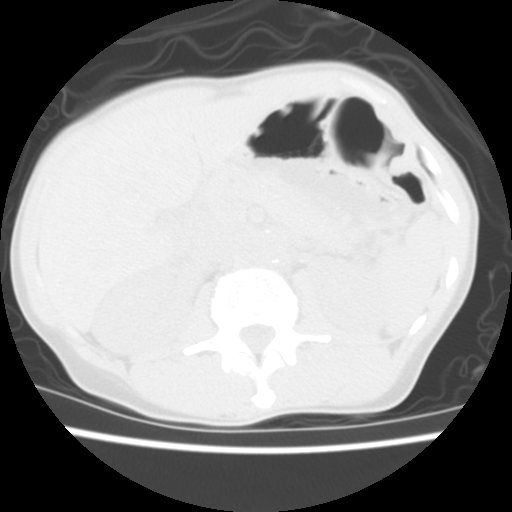
[im 58/67  soft-tissue]
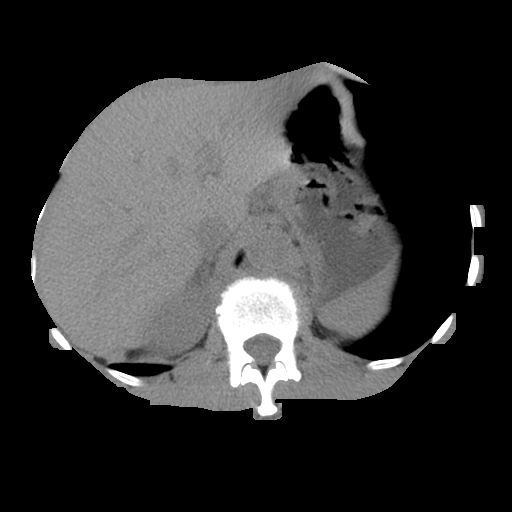
[im 58/67  lung]
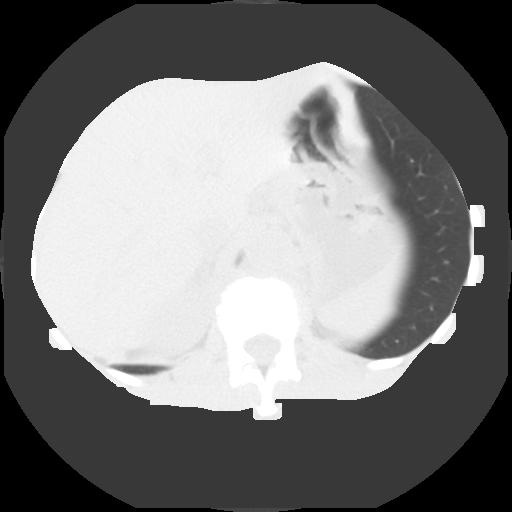

[Series 401: reformatted · sagittal · 0.62mm/px · 7 of 109 slices shown]
[im 10/109  soft-tissue]
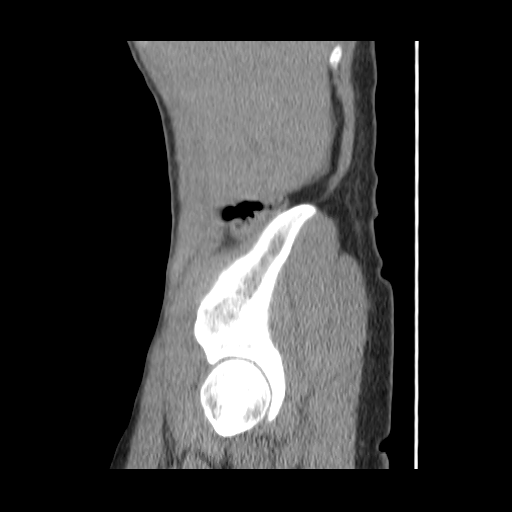
[im 28/109  soft-tissue]
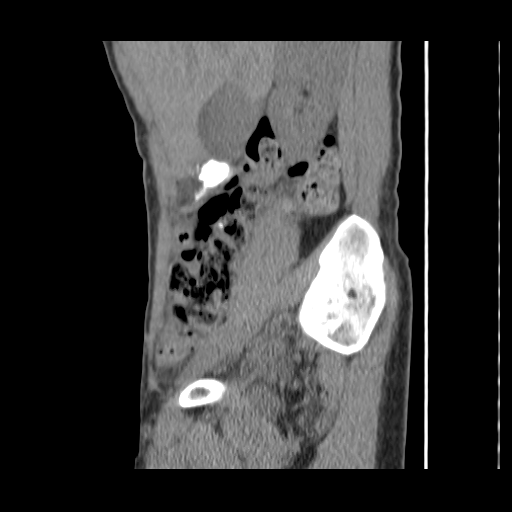
[im 37/109  soft-tissue]
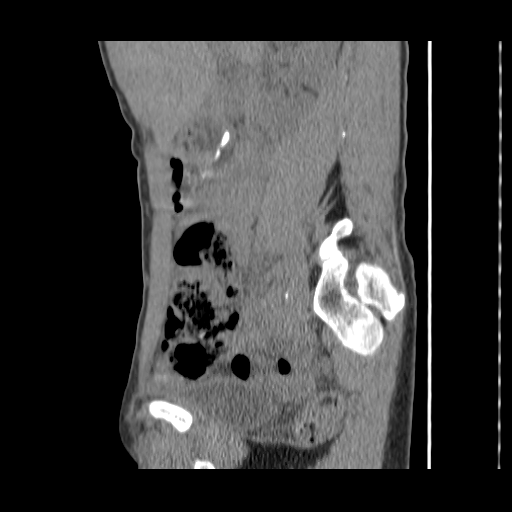
[im 46/109  soft-tissue]
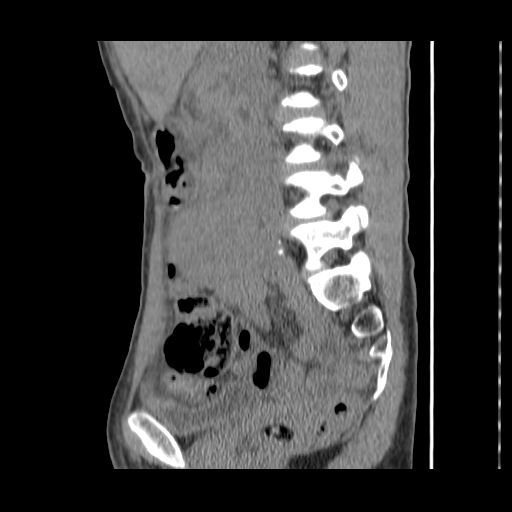
[im 64/109  soft-tissue]
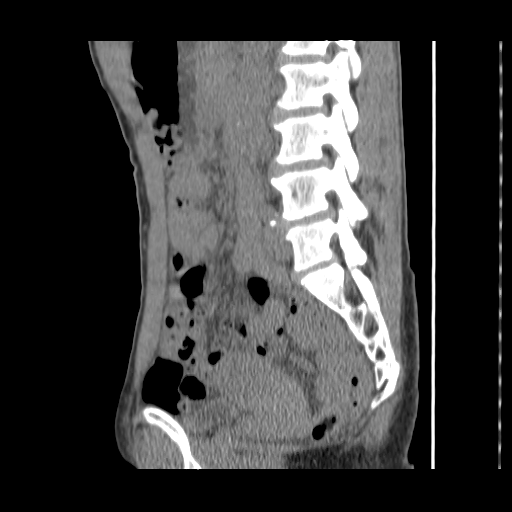
[im 73/109  soft-tissue]
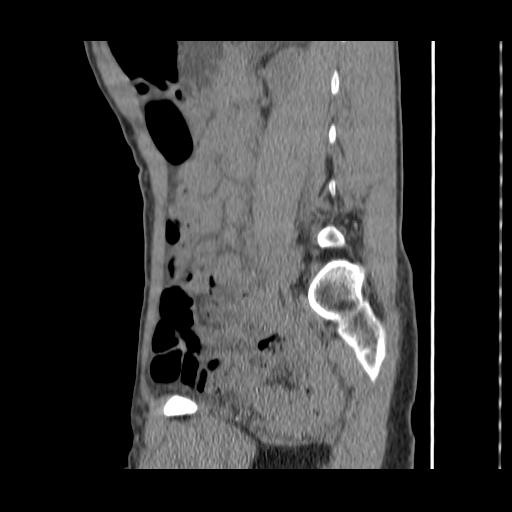
[im 82/109  soft-tissue]
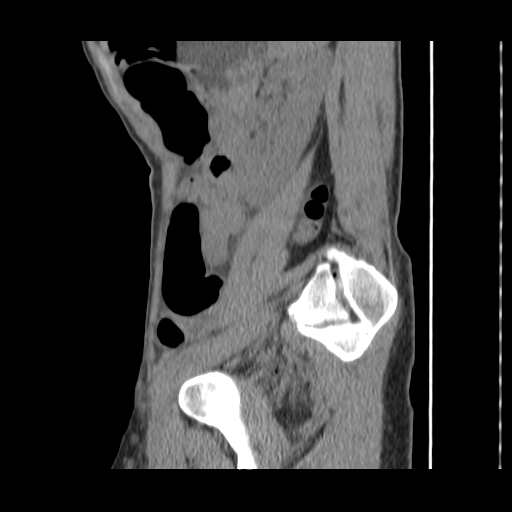

[14 of 32 positions shown; findings below may reference images not displayed]

FINDINGS: High density material is seen within the right upper quadrant.
Presumably this is within the duodenum. 

The urinary bladder is negative.

2 subcentimeter hypodensities are identified within the left lobe of liver,
unchanged from prior exam. These are too small to characterize but likely
represent simple cyst.

Visualized portions of the splenic parenchyma are unremarkable.

Adrenal glands are negative. 

The right kidney has a stone within a midpole measuring 3.3 mm, image 7. There
is no evidence for hydronephrosis.

Negative for a left-sided nephrolithiasis. 

There is mild pelvocaliectasis affecting the left kidney

There is no free fluid.

No abnormal fluid collections.

Visualized bowel loops of the upper abdomen have a nonobstructive appearance.

Review of bone windows shows and mild lumbar spondylosis.

IMPRESSION

No acute upper abdominal CT findings.

Right-sided nephrolithiasis without hydronephrosis 
PELVIS CT WITHOUT CONTRAST
FINDINGS: The bladder is negative. 

The uterus is unremarkable. The pelvic bowel loops appear nonobstructive.

There is no free fluid, or abnormal fluid collections noted. Multiple tiny
hypodensities are identified within the pelvis. These have a nonspecific
appearance but likely represent phleboliths.

Mild wall thickening involving the wall of the rectum.

No free fluid or abnormal fluid collections.

IMPRESSION

1.  Mild wall thickening involving the wall of the rectum is nonspecific.
However, it is conceivable that this may represent  proctitis. Careful clinical
correlation recommended.
2.  No free fluid.
3.  Calcific densities within the pelvis are felt to likely represent
phleboliths.

## 2008-05-20 IMAGING — CT CT ABDOMEN W/ CM
1 of 3 series · 14 of 32 positions shown, 19 images · IV contrast (omnipaque)
Comparison: CT stone study of 02/01/2007. CT of 11/09/2004.

ABDOMEN CT WITH CONTRAST

CLINICAL DATA: Left lower quadrant abdominal and back pain. Diarrhea.
TECHNIQUE: Multidetector CT imaging of the abdomen and pelvis was performed
following the standard protocol during bolus administration of intravenous
contrast.

Contrast:  100 cc Omnipaque 300

[Series 2: abd_pel 5.0 b40f st · axial · 0.59mm/px · z∈[-532,-202]mm · 14 of 76 slices shown, 19 images]
[im 5/76  soft-tissue]
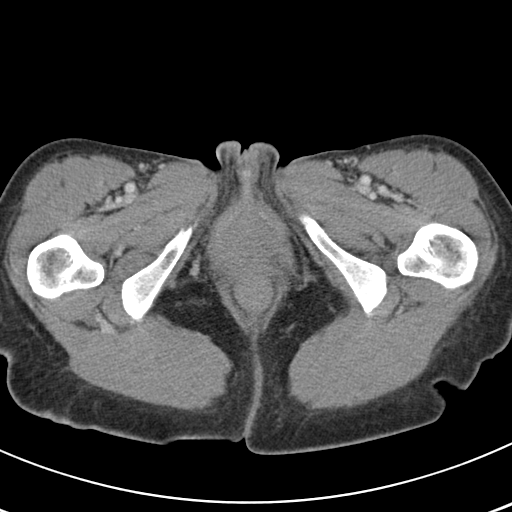
[im 5/76  bone]
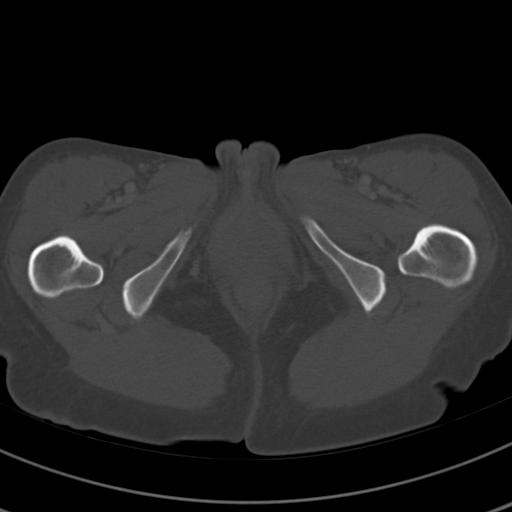
[im 9/76  soft-tissue]
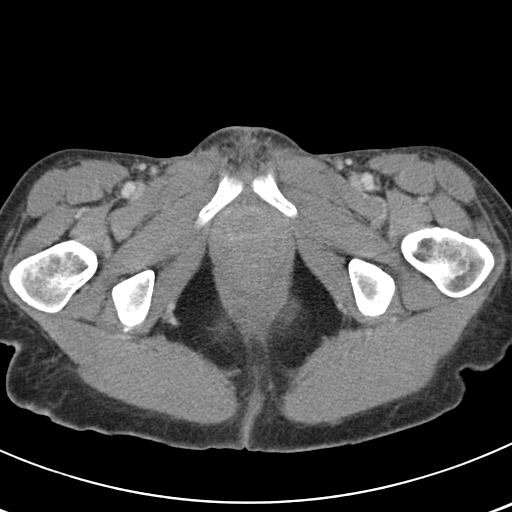
[im 17/76  soft-tissue]
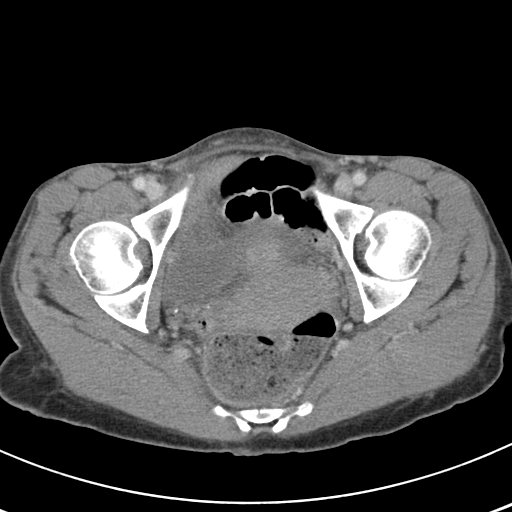
[im 21/76  soft-tissue]
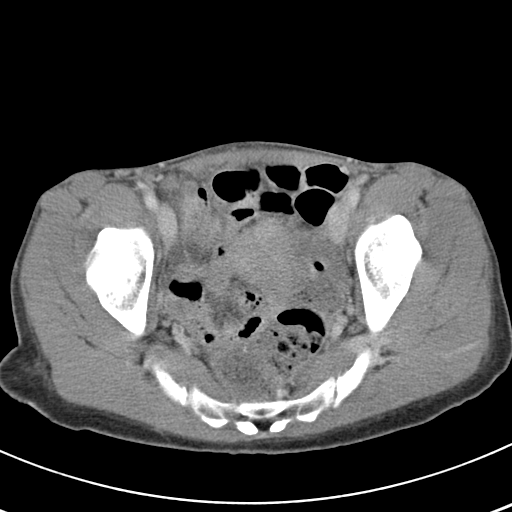
[im 26/76  soft-tissue]
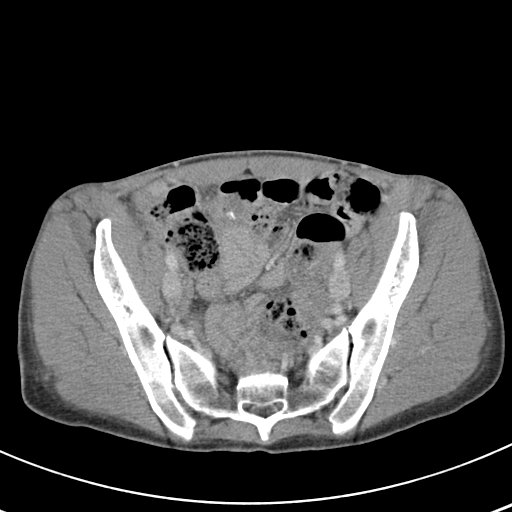
[im 34/76  soft-tissue]
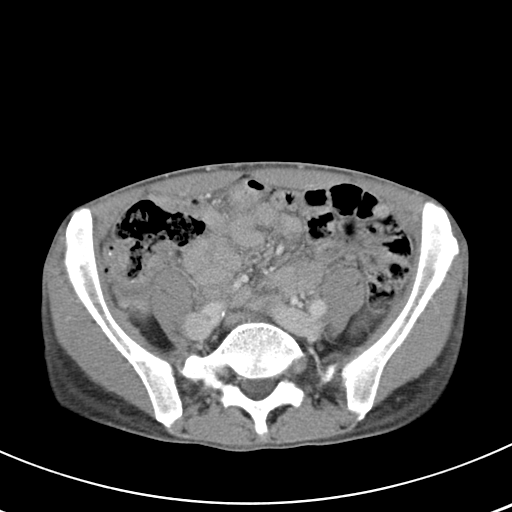
[im 38/76  soft-tissue]
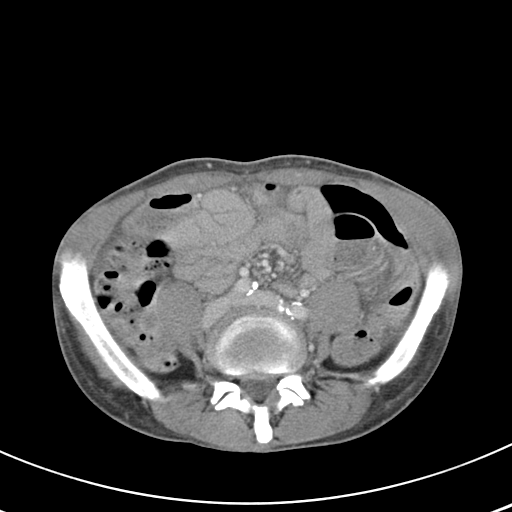
[im 42/76  soft-tissue]
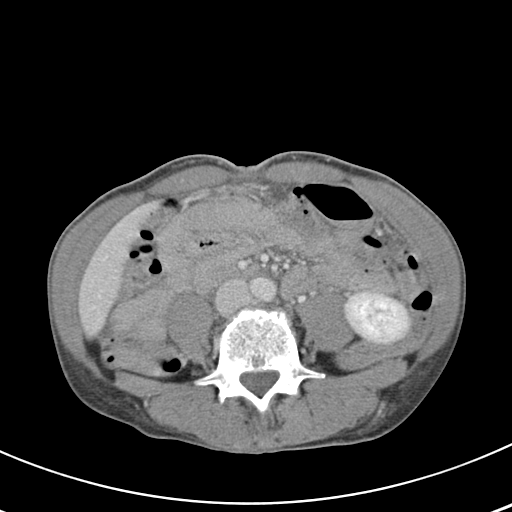
[im 51/76  soft-tissue]
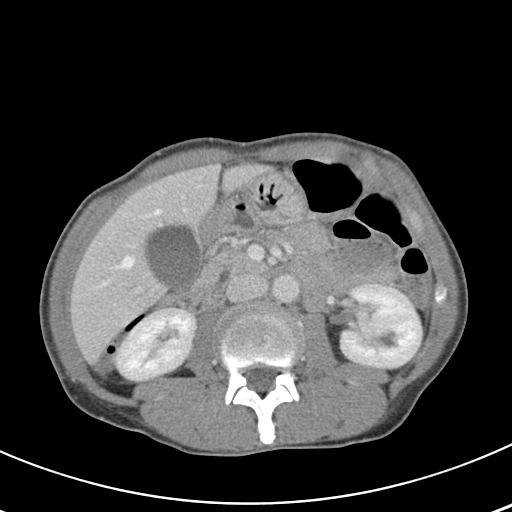
[im 51/76  bone]
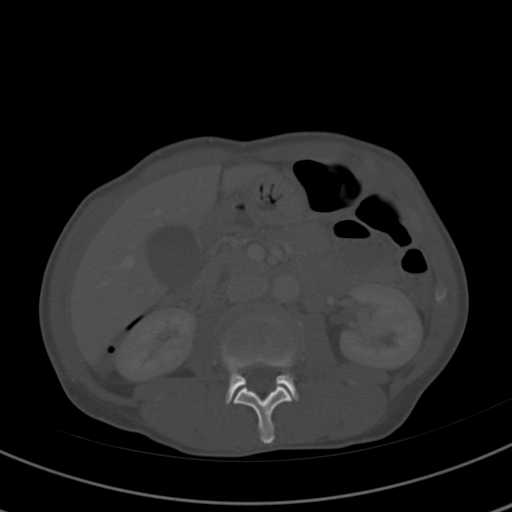
[im 55/76  soft-tissue]
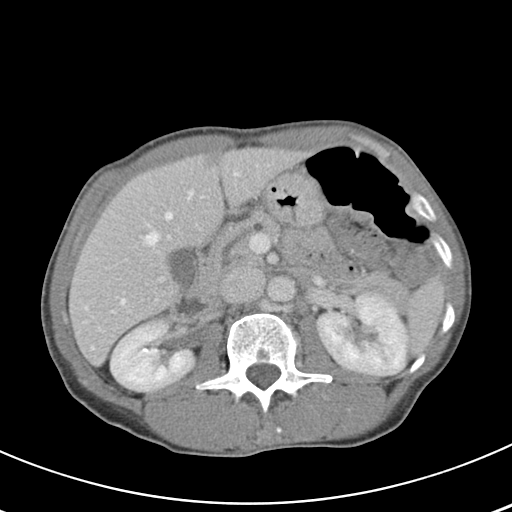
[im 59/76  soft-tissue]
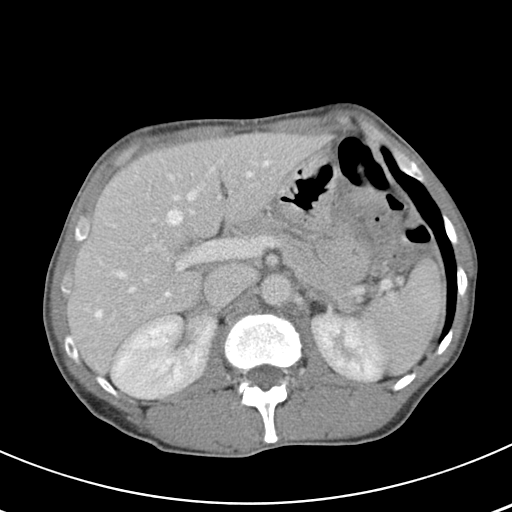
[im 59/76  lung]
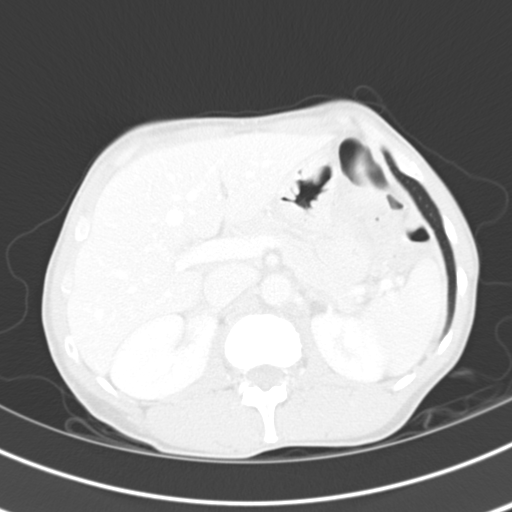
[im 63/76  lung]
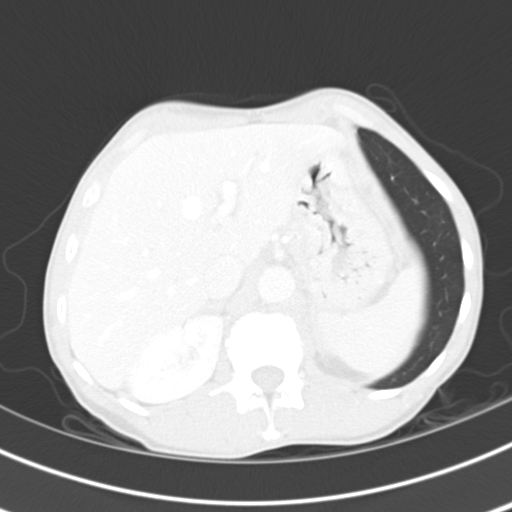
[im 67/76  soft-tissue]
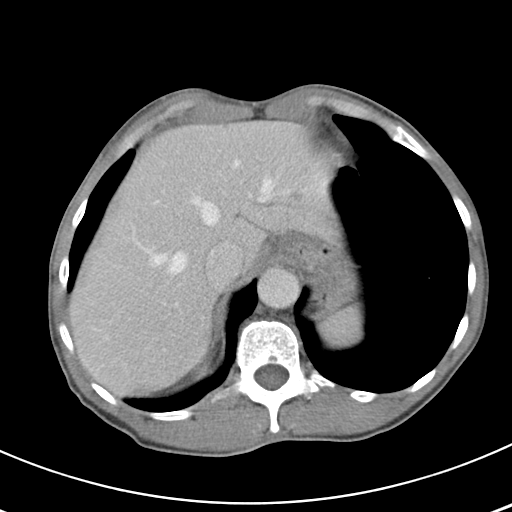
[im 67/76  lung]
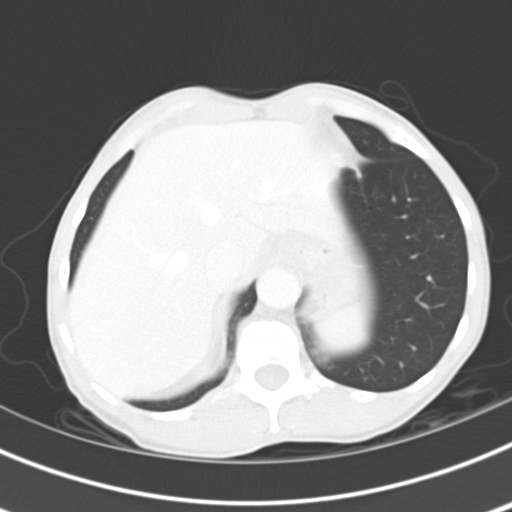
[im 71/76  soft-tissue]
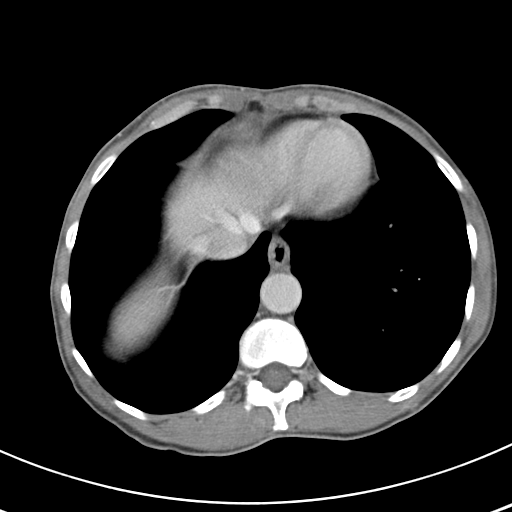
[im 71/76  lung]
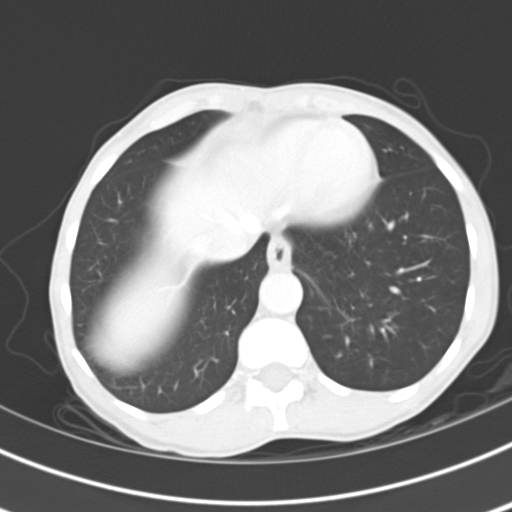

[14 of 32 positions shown; findings below may reference images not displayed]

FINDINGS: Clear lung bases. Limited evaluation of abdominal/pelvis due to
paucity of intraperitoneal fat.

Normal heart size without pericardial or pleural effusion. Small hiatal hernia
with distal esophageal wall thickening on image 8, mild. Scattered well
circumscribed low density hepatic lesions, likely cysts and too small to
characterize lesions.

Normal spleen, gallbladder, adrenal glands. Areas of apparent gastric antral
wall thickening (images 22 through 26) are likely due to underdistention. No
intra or extrahepatic biliary ductal dilatation. Pancreatic duct borderline
prominent measuring up to 4 mm in pancreatic head. No evidence of obstruction or
acute pancreatitis.

Normal adrenal glands. Right-sided nonobstructive renal calculus. Minimal left 
caliectasis. No cause identified. No retroperitoneal or retrocrural adenopathy.
Poor enteric contrast opacification. Appendix is not well visualized. No bowel
distention or ascites.

IMPRESSION

1. Exam is limited by paucity of abdominal fat and poor enteric contrast
opacification.
2. Given this limitation, no explanation for left upper quadrant pain.
3. Prominent pancreatic duct without cause identified. Correlate with risk
factors for prior episodes of pancreatitis.
4. Suspect esophagitis.
5. Apparent gastric antral thickening likely due to underdistention.
6. Right-sided nonobstructive renal calculus.

PELVIS CT WITH CONTRAST
FINDINGS: Rectal wall thickening described on stone study not appreciated.
Pelvic small bowel loops normal in caliber. Possible uterine fibroid at 1 cm on
image 57. Normal bones.

IMPRESSION

1. No acute pelvic process.
2. Probable small uterine fibroid.

## 2008-07-07 ENCOUNTER — Emergency Department (HOSPITAL_COMMUNITY): Admission: EM | Admit: 2008-07-07 | Discharge: 2008-07-07 | Payer: Self-pay | Admitting: *Deleted

## 2008-09-28 ENCOUNTER — Emergency Department (HOSPITAL_COMMUNITY): Admission: EM | Admit: 2008-09-28 | Discharge: 2008-09-28 | Payer: Self-pay | Admitting: Emergency Medicine

## 2008-10-12 ENCOUNTER — Emergency Department (HOSPITAL_COMMUNITY): Admission: EM | Admit: 2008-10-12 | Discharge: 2008-10-12 | Payer: Self-pay | Admitting: Emergency Medicine

## 2008-11-27 ENCOUNTER — Ambulatory Visit: Payer: Self-pay | Admitting: Internal Medicine

## 2008-11-27 ENCOUNTER — Encounter (INDEPENDENT_AMBULATORY_CARE_PROVIDER_SITE_OTHER): Payer: Self-pay | Admitting: Internal Medicine

## 2008-11-27 LAB — CONVERTED CEMR LAB
ALT: 16 units/L (ref 0–35)
AST: 20 units/L (ref 0–37)
CO2: 26 meq/L (ref 19–32)
Creatinine, Ser: 0.71 mg/dL (ref 0.40–1.20)
T4, Total: 8.8 ug/dL (ref 5.0–12.5)
Total Bilirubin: 0.4 mg/dL (ref 0.3–1.2)

## 2008-12-26 ENCOUNTER — Ambulatory Visit: Payer: Self-pay | Admitting: Family Medicine

## 2009-05-16 ENCOUNTER — Emergency Department (HOSPITAL_COMMUNITY): Admission: EM | Admit: 2009-05-16 | Discharge: 2009-05-16 | Payer: Self-pay | Admitting: Emergency Medicine

## 2009-06-19 ENCOUNTER — Ambulatory Visit: Payer: Self-pay | Admitting: Family Medicine

## 2009-10-11 ENCOUNTER — Emergency Department (HOSPITAL_COMMUNITY): Admission: EM | Admit: 2009-10-11 | Discharge: 2009-10-11 | Payer: Self-pay | Admitting: Emergency Medicine

## 2009-10-14 ENCOUNTER — Emergency Department (HOSPITAL_COMMUNITY): Admission: EM | Admit: 2009-10-14 | Discharge: 2009-10-14 | Payer: Self-pay | Admitting: Emergency Medicine

## 2009-10-23 IMAGING — CR DG ABDOMEN 1V
1 series · 1 of 1 positions shown · non-contrast
Comparison: CT dated 02/02/2007.

CLINICAL DATA: Right flank pain.

ABDOMEN - 1 VIEW

[t abdomen supine]
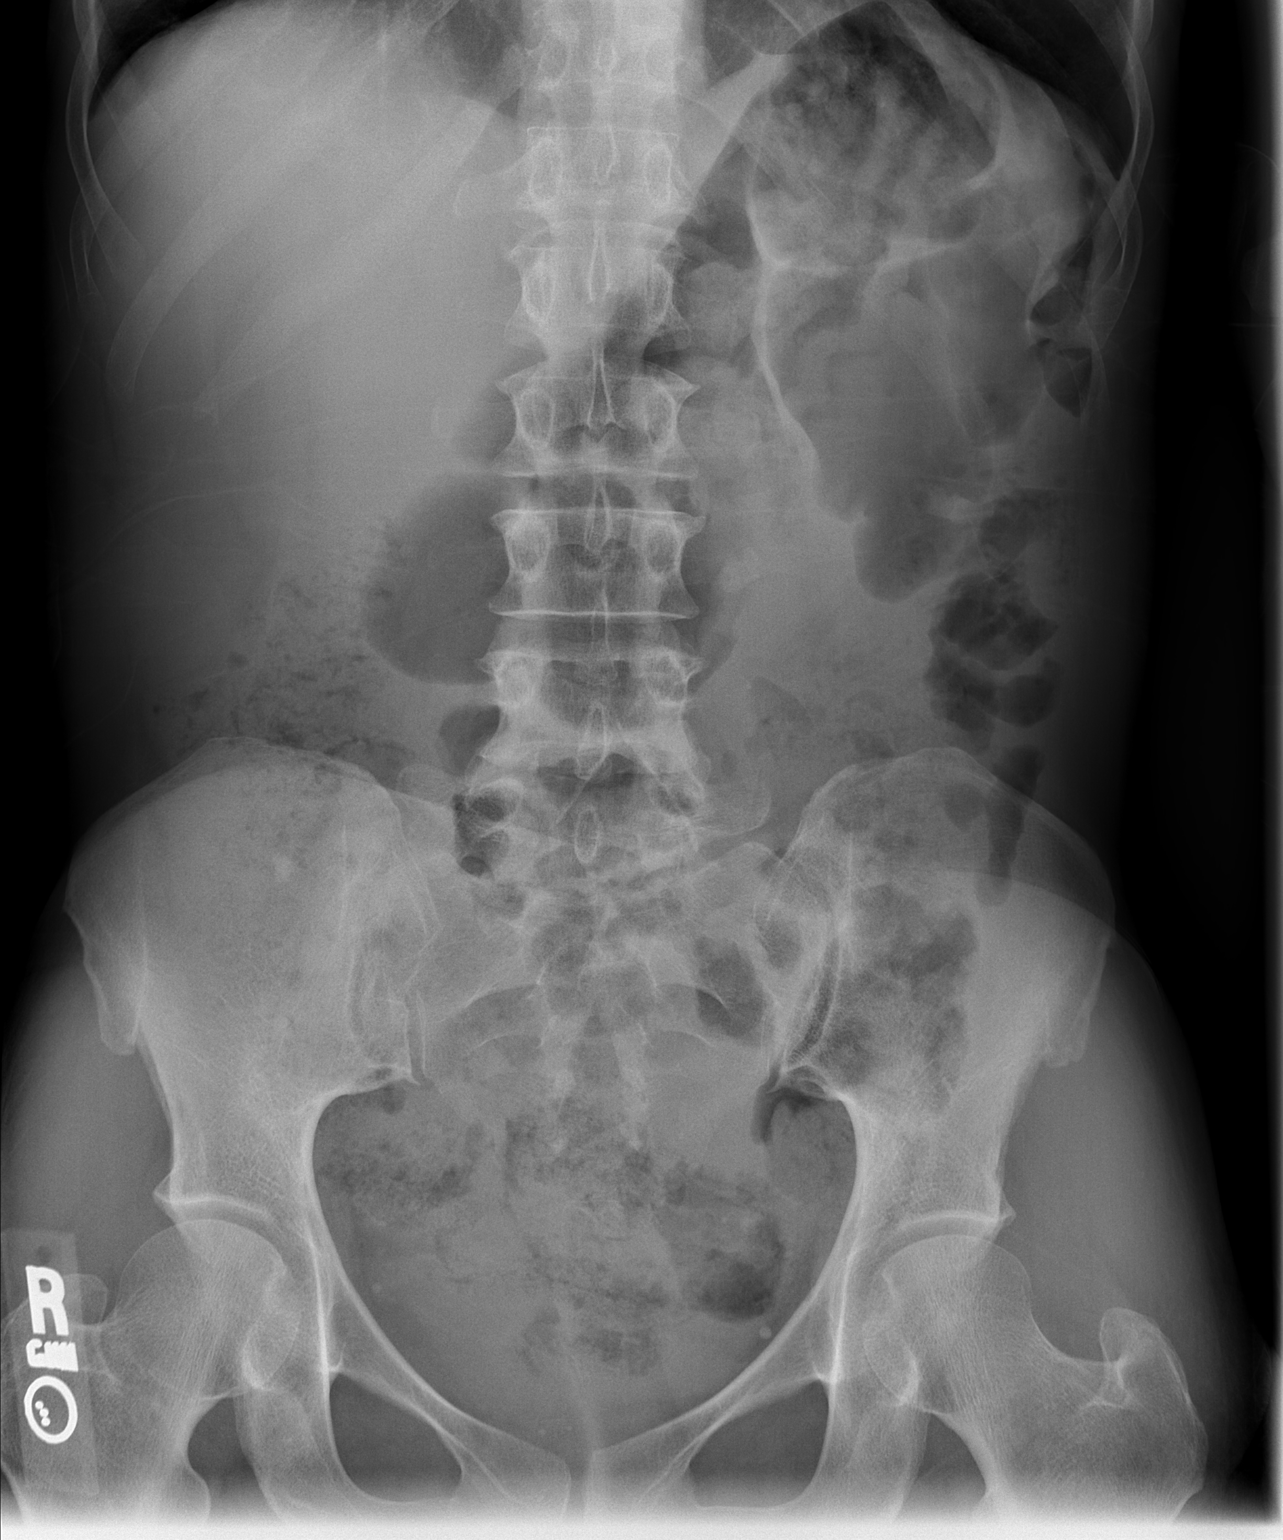

[1 of 1 positions shown; findings below may reference images not displayed]

FINDINGS: Normal bowel gas pattern with mildly prominent stool.  7
mm calcification overlying the right iliac bone, corresponding to
the location of a bone island on the CT.  Mild lumbar spine
degenerative changes.  Bilateral pelvic phleboliths.
IMPRESSION: No acute abnormality.  No calcified urinary tract calculi are seen.

## 2009-12-01 ENCOUNTER — Emergency Department (HOSPITAL_COMMUNITY): Admission: EM | Admit: 2009-12-01 | Discharge: 2009-12-01 | Payer: Self-pay | Admitting: Emergency Medicine

## 2009-12-06 ENCOUNTER — Emergency Department (HOSPITAL_COMMUNITY): Admission: EM | Admit: 2009-12-06 | Discharge: 2009-12-06 | Payer: Self-pay | Admitting: Emergency Medicine

## 2009-12-08 ENCOUNTER — Emergency Department (HOSPITAL_COMMUNITY): Admission: EM | Admit: 2009-12-08 | Discharge: 2009-12-08 | Payer: Self-pay | Admitting: Emergency Medicine

## 2009-12-17 ENCOUNTER — Emergency Department (HOSPITAL_COMMUNITY): Admission: EM | Admit: 2009-12-17 | Discharge: 2009-12-17 | Payer: Self-pay | Admitting: Emergency Medicine

## 2009-12-24 ENCOUNTER — Emergency Department (HOSPITAL_COMMUNITY): Admission: EM | Admit: 2009-12-24 | Discharge: 2009-12-24 | Payer: Self-pay | Admitting: Emergency Medicine

## 2010-01-07 ENCOUNTER — Emergency Department (HOSPITAL_COMMUNITY): Admission: EM | Admit: 2010-01-07 | Discharge: 2010-01-07 | Payer: Self-pay | Admitting: Emergency Medicine

## 2010-01-11 ENCOUNTER — Emergency Department (HOSPITAL_COMMUNITY): Admission: EM | Admit: 2010-01-11 | Discharge: 2010-01-11 | Payer: Self-pay | Admitting: Emergency Medicine

## 2010-03-11 ENCOUNTER — Ambulatory Visit: Payer: Self-pay | Admitting: Internal Medicine

## 2010-03-11 ENCOUNTER — Encounter (INDEPENDENT_AMBULATORY_CARE_PROVIDER_SITE_OTHER): Payer: Self-pay | Admitting: Adult Health

## 2010-03-11 LAB — CONVERTED CEMR LAB
Albumin: 5 g/dL (ref 3.5–5.2)
Benzodiazepines.: NEGATIVE
CO2: 26 meq/L (ref 19–32)
Calcium: 10.4 mg/dL (ref 8.4–10.5)
Chloride: 104 meq/L (ref 96–112)
Creatinine,U: 119.1 mg/dL
Eosinophils Relative: 12 % — ABNORMAL HIGH (ref 0–5)
Glucose, Bld: 84 mg/dL (ref 70–99)
HCT: 42.9 % (ref 36.0–46.0)
Lymphocytes Relative: 38 % (ref 12–46)
Lymphs Abs: 1.8 10*3/uL (ref 0.7–4.0)
Methadone: NEGATIVE
Neutro Abs: 1.9 10*3/uL (ref 1.7–7.7)
Phencyclidine (PCP): NEGATIVE
Platelets: 211 10*3/uL (ref 150–400)
Potassium: 3.3 meq/L — ABNORMAL LOW (ref 3.5–5.3)
Propoxyphene: NEGATIVE
Sodium: 142 meq/L (ref 135–145)
Total Bilirubin: 0.4 mg/dL (ref 0.3–1.2)
Total Protein: 7.9 g/dL (ref 6.0–8.3)
WBC: 4.8 10*3/uL (ref 4.0–10.5)

## 2010-03-12 ENCOUNTER — Encounter (INDEPENDENT_AMBULATORY_CARE_PROVIDER_SITE_OTHER): Payer: Self-pay | Admitting: Adult Health

## 2010-03-13 ENCOUNTER — Ambulatory Visit (HOSPITAL_COMMUNITY): Admission: RE | Admit: 2010-03-13 | Discharge: 2010-03-13 | Payer: Self-pay | Admitting: Internal Medicine

## 2010-03-13 ENCOUNTER — Ambulatory Visit: Payer: Self-pay | Admitting: Internal Medicine

## 2010-03-24 ENCOUNTER — Encounter (INDEPENDENT_AMBULATORY_CARE_PROVIDER_SITE_OTHER): Payer: Self-pay | Admitting: Internal Medicine

## 2010-03-24 ENCOUNTER — Inpatient Hospital Stay (HOSPITAL_COMMUNITY): Admission: EM | Admit: 2010-03-24 | Discharge: 2010-03-26 | Payer: Self-pay | Admitting: Emergency Medicine

## 2010-08-04 ENCOUNTER — Emergency Department (HOSPITAL_COMMUNITY)
Admission: EM | Admit: 2010-08-04 | Discharge: 2010-08-04 | Payer: Self-pay | Source: Home / Self Care | Admitting: Emergency Medicine

## 2010-08-20 ENCOUNTER — Ambulatory Visit (HOSPITAL_COMMUNITY)
Admission: RE | Admit: 2010-08-20 | Discharge: 2010-08-20 | Payer: Self-pay | Source: Home / Self Care | Attending: Family Medicine | Admitting: Family Medicine

## 2010-08-26 ENCOUNTER — Encounter (INDEPENDENT_AMBULATORY_CARE_PROVIDER_SITE_OTHER): Payer: Self-pay | Admitting: *Deleted

## 2010-08-26 LAB — CONVERTED CEMR LAB
Chloride: 105 meq/L (ref 96–112)
Potassium: 3.5 meq/L (ref 3.5–5.3)
Pro B Natriuretic peptide (BNP): 8.7 pg/mL (ref 0.0–100.0)

## 2010-09-01 IMAGING — CR DG CHEST 2V
2 series · 2 of 2 positions shown · non-contrast
Comparison: 10/16/2006

CLINICAL DATA: Asthma.  Cough.  Shortness of breath.  Congestion.
Smoker.

CHEST - 2 VIEW

[w chest pa]
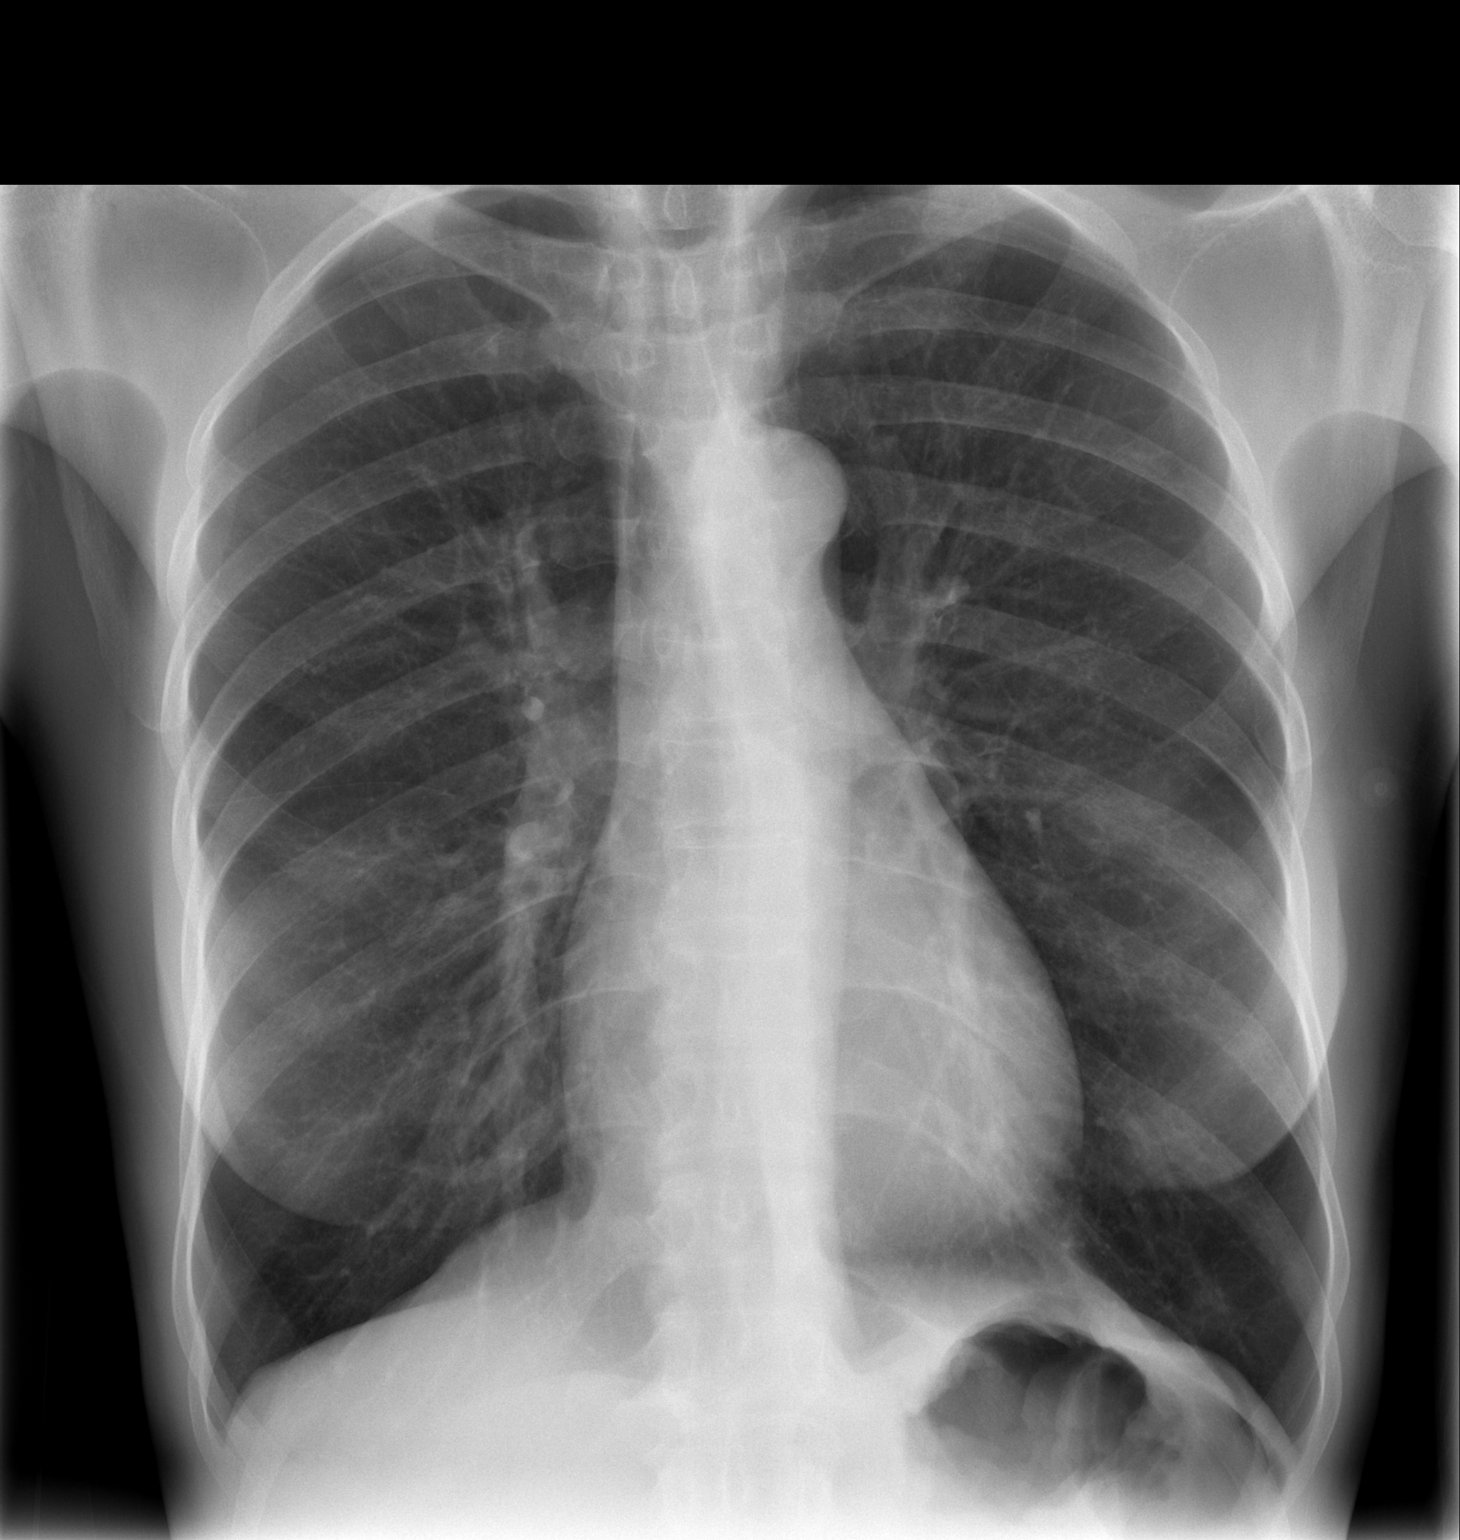

[w chest lat]
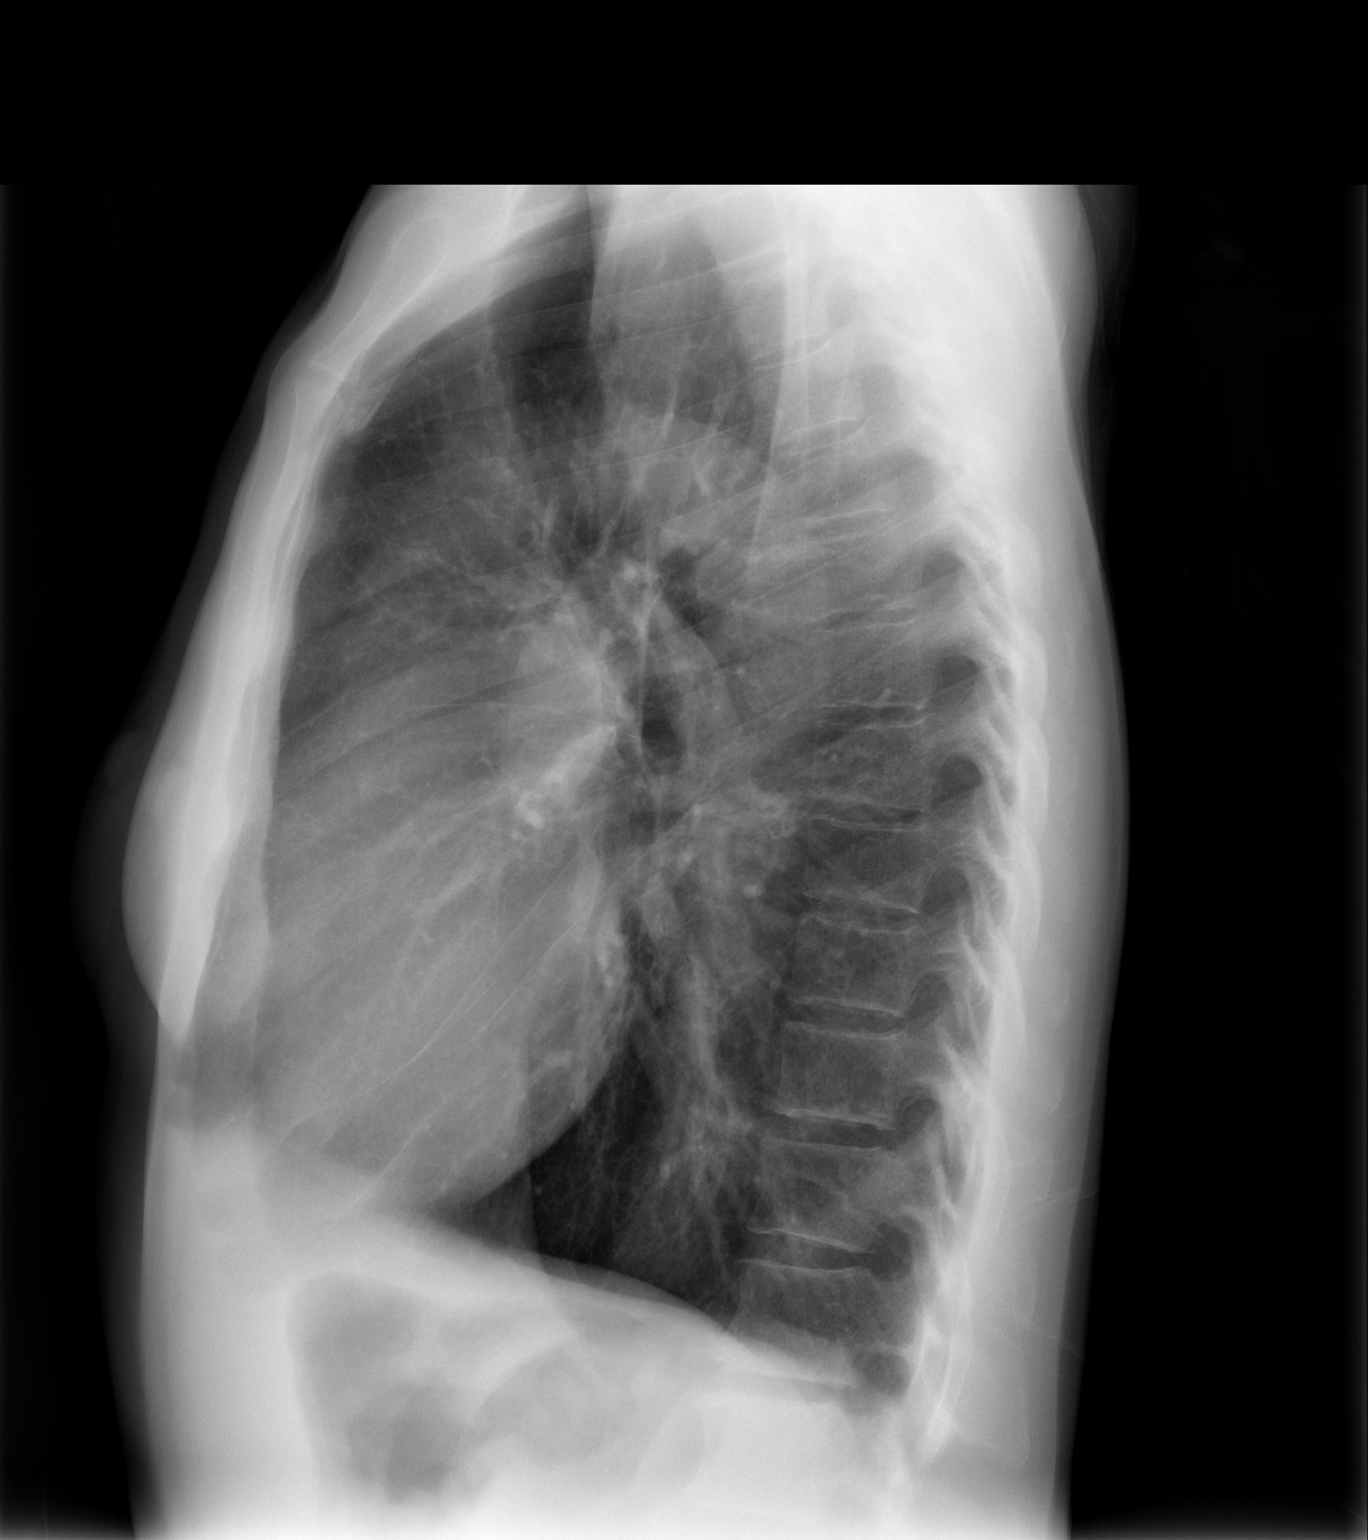

[2 of 2 positions shown; findings below may reference images not displayed]

FINDINGS: Hyperinflation suggests COPD.

Midline trachea.  Normal heart size and mediastinal contours. No
pleural effusion or pneumothorax.  Diffuse peribronchial
thickening.  Clear lungs.
IMPRESSION: 1.  No acute cardiopulmonary disease.
2.   COPD.

## 2010-09-13 ENCOUNTER — Encounter: Payer: Self-pay | Admitting: Internal Medicine

## 2010-09-13 ENCOUNTER — Encounter: Payer: Self-pay | Admitting: Family Medicine

## 2010-11-06 LAB — POCT I-STAT, CHEM 8
BUN: 16 mg/dL (ref 6–23)
Calcium, Ion: 1.24 mmol/L (ref 1.12–1.32)
Chloride: 106 mEq/L (ref 96–112)
Creatinine, Ser: 0.9 mg/dL (ref 0.4–1.2)
Glucose, Bld: 130 mg/dL — ABNORMAL HIGH (ref 70–99)
TCO2: 27 mmol/L (ref 0–100)

## 2010-11-06 LAB — CBC
HCT: 38.5 % (ref 36.0–46.0)
Hemoglobin: 13.3 g/dL (ref 12.0–15.0)
MCH: 26.7 pg (ref 26.0–34.0)
MCHC: 33.5 g/dL (ref 30.0–36.0)
MCV: 79.5 fL (ref 78.0–100.0)
MCV: 79.6 fL (ref 78.0–100.0)
Platelets: 205 10*3/uL (ref 150–400)
Platelets: 225 10*3/uL (ref 150–400)
RDW: 15.2 % (ref 11.5–15.5)
RDW: 15.5 % (ref 11.5–15.5)
WBC: 13.2 10*3/uL — ABNORMAL HIGH (ref 4.0–10.5)
WBC: 6.7 10*3/uL (ref 4.0–10.5)

## 2010-11-06 LAB — BASIC METABOLIC PANEL
CO2: 26 mEq/L (ref 19–32)
Calcium: 9 mg/dL (ref 8.4–10.5)
Creatinine, Ser: 0.74 mg/dL (ref 0.4–1.2)
GFR calc Af Amer: >60 mL/min (ref >60)
Glucose, Bld: 93 mg/dL (ref 70–99)

## 2010-11-06 LAB — DIFFERENTIAL
Basophils Absolute: 0 10*3/uL (ref 0.0–0.1)
Basophils Relative: 0 % (ref 0–1)
Eosinophils Absolute: 0.3 10*3/uL (ref 0.0–0.7)
Eosinophils Relative: 8 % — ABNORMAL HIGH (ref 0–5)
Lymphocytes Relative: 37 % (ref 12–46)
Monocytes Absolute: 0.7 10*3/uL (ref 0.1–1.0)
Monocytes Relative: 10 % (ref 3–12)
Neutro Abs: 3 10*3/uL (ref 1.7–7.7)
Neutrophils Relative %: 58 % (ref 43–77)

## 2010-11-06 LAB — CARDIAC PANEL(CRET KIN+CKTOT+MB+TROPI)
CK, MB: 3.6 ng/mL (ref 0.3–4.0)
Relative Index: INVALID (ref 0.0–2.5)
Total CK: 103 U/L (ref 7–177)
Troponin I: 0.01 ng/mL (ref 0.00–0.06)

## 2010-11-06 LAB — POCT I-STAT 3, ART BLOOD GAS (G3+)
Patient temperature: 98.6
TCO2: 28 mmol/L (ref 0–100)
pCO2 arterial: 43.9 mmHg (ref 35.0–45.0)
pH, Arterial: 7.397 (ref 7.350–7.400)

## 2010-11-06 LAB — RAPID URINE DRUG SCREEN, HOSP PERFORMED
Amphetamines: NOT DETECTED
Benzodiazepines: NOT DETECTED
Cocaine: NOT DETECTED
Tetrahydrocannabinol: POSITIVE — AB

## 2010-11-06 LAB — URINALYSIS, ROUTINE W REFLEX MICROSCOPIC
Glucose, UA: NEGATIVE mg/dL
Leukocytes, UA: NEGATIVE
Nitrite: NEGATIVE
Protein, ur: NEGATIVE mg/dL
pH: 5.5 (ref 5.0–8.0)

## 2010-11-06 LAB — POCT CARDIAC MARKERS
CKMB, poc: 1.6 ng/mL (ref 1.0–8.0)
Troponin i, poc: <0.05 ng/mL (ref 0.00–0.09)

## 2010-11-06 LAB — LIPID PANEL
Cholesterol: 226 mg/dL — ABNORMAL HIGH (ref 0–200)
HDL: 72 mg/dL (ref >39)
LDL Cholesterol: 143 mg/dL — ABNORMAL HIGH (ref 0–99)

## 2010-11-06 LAB — BLOOD GAS, ARTERIAL
Acid-base deficit: 4.3 mmol/L — ABNORMAL HIGH (ref 0.0–2.0)
Bicarbonate: 19.6 mEq/L — ABNORMAL LOW (ref 20.0–24.0)
Drawn by: 30599
O2 Content: 2 L/min
pCO2 arterial: 32.5 mmHg — ABNORMAL LOW (ref 35.0–45.0)
pO2, Arterial: 71.2 mmHg — ABNORMAL LOW (ref 80.0–100.0)

## 2010-11-06 LAB — COMPREHENSIVE METABOLIC PANEL
Albumin: 3.3 g/dL — ABNORMAL LOW (ref 3.5–5.2)
Alkaline Phosphatase: 54 U/L (ref 39–117)
BUN: 7 mg/dL (ref 6–23)
GFR calc Af Amer: >60 mL/min (ref >60)
Potassium: 3.4 mEq/L — ABNORMAL LOW (ref 3.5–5.1)
Total Protein: 6 g/dL (ref 6.0–8.3)

## 2010-11-06 LAB — URINE CULTURE: Culture  Setup Time: 201108021021

## 2010-11-06 LAB — TSH: TSH: 0.976 u[IU]/mL (ref 0.350–4.500)

## 2010-11-06 LAB — THEOPHYLLINE LEVEL: Theophylline Lvl: 3.8 ug/mL — ABNORMAL LOW (ref 10.0–20.0)

## 2010-11-06 LAB — POTASSIUM: Potassium: 2.4 mEq/L — CL (ref 3.5–5.1)

## 2010-11-06 LAB — URINE MICROSCOPIC-ADD ON

## 2010-11-06 LAB — CK TOTAL AND CKMB (NOT AT ARMC)
CK, MB: 2.1 ng/mL (ref 0.3–4.0)
Relative Index: INVALID (ref 0.0–2.5)
Total CK: 70 U/L (ref 7–177)

## 2010-11-06 LAB — PROTIME-INR: INR: 1.03 (ref 0.00–1.49)

## 2010-11-09 LAB — DIFFERENTIAL
Basophils Relative: 1 % (ref 0–1)
Eosinophils Absolute: 0.5 10*3/uL (ref 0.0–0.7)
Eosinophils Relative: 10 % — ABNORMAL HIGH (ref 0–5)
Lymphs Abs: 1.6 10*3/uL (ref 0.7–4.0)
Monocytes Absolute: 0.4 10*3/uL (ref 0.1–1.0)

## 2010-11-09 LAB — CBC
HCT: 37.6 % (ref 36.0–46.0)
MCHC: 34.7 g/dL (ref 30.0–36.0)
MCV: 82.1 fL (ref 78.0–100.0)
Platelets: 191 10*3/uL (ref 150–400)
WBC: 4.5 10*3/uL (ref 4.0–10.5)

## 2010-11-09 LAB — BASIC METABOLIC PANEL
Calcium: 9.6 mg/dL (ref 8.4–10.5)
GFR calc Af Amer: >60 mL/min (ref >60)
Glucose, Bld: 96 mg/dL (ref 70–99)
Sodium: 137 mEq/L (ref 135–145)

## 2010-11-10 LAB — DIFFERENTIAL
Basophils Absolute: 0 10*3/uL (ref 0.0–0.1)
Eosinophils Absolute: 0.3 10*3/uL (ref 0.0–0.7)
Eosinophils Relative: 6 % — ABNORMAL HIGH (ref 0–5)
Neutrophils Relative %: 36 % — ABNORMAL LOW (ref 43–77)

## 2010-11-10 LAB — CBC
HCT: 37.5 % (ref 36.0–46.0)
MCV: 82.9 fL (ref 78.0–100.0)
Platelets: 176 10*3/uL (ref 150–400)
RDW: 15.3 % (ref 11.5–15.5)

## 2010-11-10 LAB — POCT I-STAT, CHEM 8
Calcium, Ion: 1.13 mmol/L (ref 1.12–1.32)
Calcium, Ion: 1.17 mmol/L (ref 1.12–1.32)
HCT: 38 % (ref 36.0–46.0)
HCT: 41 % (ref 36.0–46.0)
Hemoglobin: 12.9 g/dL (ref 12.0–15.0)
Hemoglobin: 13.9 g/dL (ref 12.0–15.0)
TCO2: 26 mmol/L (ref 0–100)
TCO2: 27 mmol/L (ref 0–100)

## 2010-11-10 LAB — THEOPHYLLINE LEVEL: Theophylline Lvl: <0.5 ug/mL — ABNORMAL LOW (ref 10.0–20.0)

## 2010-11-27 LAB — BASIC METABOLIC PANEL
CO2: 25 mEq/L (ref 19–32)
Calcium: 9.6 mg/dL (ref 8.4–10.5)
Creatinine, Ser: 0.65 mg/dL (ref 0.4–1.2)
GFR calc Af Amer: >60 mL/min (ref >60)

## 2010-11-27 LAB — CBC
MCHC: 33.3 g/dL (ref 30.0–36.0)
RBC: 4.48 MIL/uL (ref 3.87–5.11)

## 2010-12-08 LAB — URINALYSIS, ROUTINE W REFLEX MICROSCOPIC
Glucose, UA: NEGATIVE mg/dL
Leukocytes, UA: NEGATIVE
Protein, ur: NEGATIVE mg/dL
Urobilinogen, UA: 0.2 mg/dL (ref 0.0–1.0)

## 2010-12-08 LAB — POCT PREGNANCY, URINE: Preg Test, Ur: NEGATIVE

## 2010-12-08 LAB — URINE MICROSCOPIC-ADD ON

## 2011-01-08 NOTE — Discharge Summary (Signed)
[name], [name]                ACCOUNT NO.:  [id number]   MEDICAL RECORD NO.:  [id number]          PATIENT TYPE:  INP   LOCATION:  1333                         FACILITY:  [facility]   PHYSICIAN:  Madaline Savage, MD        DATE OF BIRTH:  Jun 30, 1951   DATE OF ADMISSION:  10/16/2006  DATE OF DISCHARGE:  10/18/2006                               DISCHARGE SUMMARY   PRIMARY CARE PHYSICIAN:  Dr. Rosalita Chessman Drinkard, [facility]. The patient  is unassigned to [name].   DISCHARGE DIAGNOSES:  1. Upper respiratory infection.  2. Dehydration.  3. Hypokalemia.  4. Acute gastritis.  5. History of bronchial asthma.   DISCHARGE MEDICATIONS:  1. Senokot S 1 tablet daily as needed.  2. Phenergan 25 mg every 8 h as needed.  3. Protonix 40 mg once daily.  4. Lisinopril 5 mg once daily.  5. Theo-Dur 300 mg twice daily.  6. Albuterol metered dose inhaler 2 puffs every 6 h as needed.  7. Tylenol 500 mg three times daily as needed.   PROCEDURE:  She had an x-ray of the chest done on October 16, 2006  which showed COPD and no active disease.   HISTORY OF PRESENT ILLNESS:  For the full history and physical, see the  history and physical dictated by Dr. Ricke Hey on October 16, 2006. In  short, [name] is a 60 year old lady with a past medical history of  asthma and hypertension who came in with nasal congestion, cough and  abdominal discomfort, nausea and vomiting which has been going on the  last 3-4 days. Initial evaluation was unremarkable. Her x-ray of the  chest showed COPD. She was admitted with a diagnosis of upper  respiratory infection and acute gastritis.   PROBLEM LIST:  1. Upper respiratory infection. Her infection is most likely viral.      Her x-ray of the chest was negative. We did blood cultures which      are negative and urine cultures which are all negative. She had      symptomatic treatment. She does not need to be on any antibiotics      at this point in time.  2. Acute  gastritis. This is secondary to her upper respiratory      infection which has resolved at this point in time. Her nausea is      better. We will put her on as needed Phenergan at home.  3. Dehydration which is resolved with IV fluids. Her vomiting has      improved.  4. History of bronchial asthma. She is stable during this hospital      stay.   DISPOSITION:  She will be discharged home in stable condition. She has  been advised to take more fluids while she was at home.   FOLLOWUP:  She was asked to followup with her primary care doctor, Dr.  Margarette Canada, in 1-2 weeks.      Madaline Savage, MD  Electronically Signed     PKN/MEDQ  D:  10/18/2006  T:  10/18/2006  Job:  [id number] 

## 2011-01-08 NOTE — Consult Note (Signed)
NAME:  [name], [name]                          ACCOUNT NO.:  [id number]   MEDICAL RECORD NO.:  [id number]                   PATIENT TYPE:  INP   LOCATION:  0343                                 FACILITY:  [facility]   PHYSICIAN:  Melvyn Novas, M.D.               DATE OF BIRTH:  September 15, 1950   DATE OF CONSULTATION:  05/01/2003  DATE OF DISCHARGE:                                   CONSULTATION   CONSULTING PHYSICIAN:  Melvyn Novas, M.D.   HISTORY OF PRESENT ILLNESS:  The patient is a 59 year old right handed  African-American female admitted with left temporal headache, dizziness, and  hypertension admitted yesterday. Chem 7 and basic metabolic profiles were  negative. The patient had a negative CT. Also the CT raised the question of  a possible left cortical indentation? An MRI was done and repeated today of  the brain. This shows no abnormalities except small punctate lesions  throughout the right frontal parietal and partially temporal lobe, more than  on the left, where these lesions occur infrequent. This is much more  appearing, like a vasculitic change. These lesions are non-confluent. They  are not periventricular and they are therefore not likely to be MS. Another  possibility is that they could be precipitated by migraines but the  patient's history of headaches with hypertension exacerbation and also  associated dizziness, makes it much more likely to be hypertension vascular-  like.   PHYSICAL EXAMINATION:  LUNGS: Clear to auscultation. She has a history of  asthma but shows no wheezing.  HEART: Regular rate and rhythm. No murmur.  ABDOMEN: Soft. No edema. She is slender.  EXTREMITIES: All peripheral pulses are palpable. There is no edema,  clubbing, or cyanosis.  BACK: The patient has no history of spinal problems, neck pain, cervical  _______ headaches or brain injuries that she recalls.  NEUROLOGIC: Mental status, alert and oriented x3. Cranial nerves equal.  Pupils  are reactive to light and accommodation. No papilledema. Extraocular  movements intact. Fully conjugate. Full facial strength and equal facial  sensory. Full visual field to bilateral simultaneous stimulation. Tongue and  uvula are midline. The patient shows no delayed gag. No deviation. Her neck  is supple. Motor examination shows the finger to nose test is intact. She  has 5 out of 5 strength, tone, and mass. She does not show any focal  tenderness. Deep tendon reflexes are downgoing to plantar stimulation and  were 1+ throughout without lateralization. Sensory shows that primary  modalities are intact bilaterally throughout the face, trunk and  extremities.   SOCIAL HISTORY:  The patient works as a Engineering geologist. She is separated.  She has 2 children. She is a non-smoker and drinks very rarely alcohol.   FAMILY HISTORY:  Positive for strokes, diabetes mellitus, hypertension, and  no sickle cell trait. There is also a history of carcinoma in the family.   MEDICATIONS:  At home are Theo-Dur 300 mg SR b.i.d., Albuterol b.i.d.   ASSESSMENT AND RECOMMENDATIONS:  1. Vasculitis or small vessel disease. Related MRI changes. I suppose I     recommend to send a vasculitic panel including sed rate, ANA, ANCA, SPEP     and uni________.  2. The patient also has a vascular headache, often secondary to her high     blood pressures and also associated with dizziness. Recommend steroid     dose pack after vasculitis panel is drawn. Reinforce hypertension therapy     compliance.  3.     Midrin for vascular headaches.  4. Aspirin if the patient can take it with her asthma history. Otherwise, I     would rather place her on another anti-platelet such as Plavix.                                               Melvyn Novas, M.D.    CD/MEDQ  D:  05/01/2003  T:  05/01/2003  Job:  [id number]

## 2011-01-08 NOTE — H&P (Signed)
NAMEBRETTNEY, [name]                ACCOUNT NO.:  [id number]   MEDICAL RECORD NO.:  [id number]          PATIENT TYPE:  INP   LOCATION:  0102                         FACILITY:  [facility]   PHYSICIAN:  Isidor Holts, M.D.  DATE OF BIRTH:  November 03, 1950   DATE OF ADMISSION:  10/16/2006  DATE OF DISCHARGE:                              HISTORY & PHYSICAL   PRIMARY MEDICAL DOCTOR:  Dr. Rosalita Chessman Drinkard of [facility].  The  patient is unassigned to [name].   CHIEF COMPLAINT:  Nasal congestion, cough productive of whitish phlegm  for 1 week.  Also abdominal discomfort, nausea and vomiting for  approximately 4 days.   HISTORY OF PRESENT ILLNESS:  This is a 60 year old female. For past  medical history, see below.  According to the patient, she has had upper  respiratory tract symptoms as detailed above, for approximately 1 week.  She has also felt somewhat feverish, although she did not measure her  temperature.  On 10/13/2006, because of above symptoms, she went to the  emergency department at [facility], where reportedly she  started vomiting, had a chest x-ray, was given IV fluids, and then  subsequently discharged.  Symptoms continued to worsen.  She denies  diarrhea, on the contrary, she was quite constipated, but this responded  to milk of magnesia.  Stools were normal.  Because of continued  symptoms.  She represented to the emergency department at [facility]   PAST MEDICAL HISTORY:  1. Bronchial asthma.  2. Hypertension.  3. GERD.  4. Status post bilateral tubal ligation in 1980.  5. Status post excision of scalp cyst 09/06/2006.  6. History of headaches.   MEDICATION HISTORY:  1. Protonix 40 mg p.o. daily.  2. Lisinopril 5 mg p.o. daily.  3. TheoDur 300 mg p.o. b.i.d.  4. Albuterol MDI 2 puffs p.r.n.  5. Tylenol 500 mg p.o. p.r.n.   ALLERGIES:  No known drug allergies.   SYSTEMS REVIEW:  Essentially as per HPI and chief complaint, otherwise  negative.  The  patient describes chest pain when she coughs strenuously.   SOCIAL HISTORY:  The patient is single, had three offspring, i.e. one  boy and two daughters.  Her son died at age 18 years, status post  gunshot wound.  Her two daughters are alive and well.  The patient has  several sisters, one of whom has bronchial asthma.  Her parents are both  deceased.  Her mother status post MI, and her father of cancer, although  the patient is not sure which type.   FAMILY HISTORY:  Otherwise noncontributory.   PHYSICAL EXAMINATION:  VITALS:  Temperature 99.1, pulse 101 per minute  and regular, respiratory rate 24 beats, BP 125/79 mmHg, pulse oximeter  96% on room air.  GENERAL:  The patient does not appear to be in obvious acute distress,  alert, communicative, not short of breath at rest.  HEENT: No clinical  pallor, no jaundice, no conjunctival injection.  Throat is clear.  Visible mucous membranes appear moist.  NECK:  Supple.  JVP not seen.  No palpable  lymphadenopathy.  No palpable  goiter.  CHEST:  Clinically clear to auscultation.  No wheezes.  No crackles.  HEART:  Sounds 1 and 2 heard, normal, regular, no murmurs.  ABDOMEN:  Flat, soft, nontender.  Although the patient does have mild  suprapubic discomfort.  Bowel sounds are normal.  EXTREMITIES:  Lower extremity examination showed no pitting edema.  Palpable peripheral pulses.  MUSCULOSKELETAL EXAMINATION:  Quite unremarkable.  CENTRAL NERVOUS SYSTEM:  No focal neurologic deficit on gross  examination.   INVESTIGATIONS:  CBC WBC 3.8, hemoglobin 14.8, hematocrit 43.0,  platelets 185.  Electrolytes:  Sodium 140, potassium 2.8, chloride 98,  CO2 32, BUN 9, creatinine 0.86, glucose 113.   CHEST X-RAY:  Dated 10/16/2006 , shows COPD/emphysema but no active  cardiopulmonary disease.   ASSESSMENT AND PLAN:  1. Upper respiratory tract illness, likely viral in etiology.  The      patient continues to have clear phlegm, therefore we shall  not      utilize antibiotic treatment at this at this time.  We shall      utilize symptomatic treatment with Robitussin, Tylenol, and      Nasonex.   1. Acute gastritis.  This is secondary to #1 above.  We shall manage      the patient with bowel rest, antiemetics, intravenous fluids, and      proton pump inhibitor.  The patient has failed outpatient      management because of persistent vomiting and nausea.  She,      therefore, merits admission for this, until acute symptoms have      subsided.   1. Dehydration/hypokalemia, secondary to #2 above.  We shall replete      potassium, check magnesium, and hydrate appropriately.   1. History of bronchial asthma.  This appears clinically stable at      present.  We shall utilize bronchodilator nebulizers.   Note the patient has suprapubic discomfort although she does not mention  dysuria. For completeness however, we shall check urinalysis.   Further management will depend on clinical course.      Isidor Holts, M.D.  Electronically Signed    CO/MEDQ  D:  10/16/2006  T:  10/16/2006  Job:  [id number]   cc:   [facility] Dr. Rosalita Chessman Drinkard

## 2011-01-08 NOTE — Discharge Summary (Signed)
NAME:  [name], [name]                          ACCOUNT NO.:  [id number]   MEDICAL RECORD NO.:  [id number]                   PATIENT TYPE:  INP   LOCATION:  0343                                 FACILITY:  [facility]   PHYSICIAN:  Jackie Plum, M.D.             DATE OF BIRTH:  July 16, 1951   DATE OF ADMISSION:  04/30/2003  DATE OF DISCHARGE:  05/02/2003                                 DISCHARGE SUMMARY   PRIMARY CARE:  [facility]   DISCHARGE DIAGNOSES:  1. 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  2. Asthma.  3. Hypertension.   DISCHARGE MEDICATIONS:  1. Aspirin 325 mg daily.  2. Theo-Dur 300 mg b.i.d.  3. HCTZ 25 mg daily.  4. Albuterol inhaler two puffs b.i.d.  5. Prednisone taper 40 mg x2 days, then 30 mg x2 days, then 20 mg x2 days,     then 10 mg x2 days.  6. Pepcid AC daily while taking prednisone.  7. Darvocet-N 100 one to two q.4h. for pain.  8. Midrin 25 mg two tablets at onset of headache with one tablet q.1h. up to     five doses.   ALLERGIES:  NKDA.   PROCEDURE:  None.   HISTORY OF PRESENT ILLNESS:  This is a 60 year old female who was in her  usual state of health until the day of presentation when she was on her way  to work in the car with her daughter and she apparently had a short syncopal  episode.  She came to spontaneously.  Was mildly disoriented on awakening.  Daughter did not notice any jerking or tonic-clonic movements.  The patient  denies nausea, vomiting, fever, chills, diarrhea, constipation, or urinary  changes.  Does complain of some dizziness.  She is admitted for further  evaluation and work-up.   HOSPITAL COURSE:  12-lead EKG in the emergency room reveals normal sinus  rhythm with no ST or T-wave changes and no significant ectopy.  The patient  was maintained on telemetry monitoring throughout her stay.  She did not  have any ectopy nor did she have any acute ST or T-wave changes.  The  patient was evaluated by Melvyn Novas, M.D. of neurology because she did  have abnormal findings on head CT and MRI.  These included nonspecific  multiple punctate foci of increased T2 prolongation involving the cerebral  white matter in the right frontal lobe.  Melvyn Novas, M.D. felt that the  patient may be having a vascular headache and laboratory work was sent to  work her up for same, most of which is still pending at this time.  The  patient was started on steroid taper and encouraged to remain compliant with  her hypertensive therapy as her dizziness and headache are worse when she  does not take her HCTZ.   The patient's headache resolved several hours after admission.  She did not  have any nausea, vomiting, fever, or chills.  She did undergo 2-D  echocardiogram, results of which were pending at this time.  At time of  discharge she is free of dizziness, chest pain, shortness of breath, visual  changes.  She is afebrile and her vital signs are stable.   The patient was maintained on her outpatient medication regimen for her  hypertension and asthma.  She did not suffer any complications of these  diagnoses and did not require any interventions.   DISCHARGE LABORATORIES:  RPR is nonreactive.  CRP 0.4.  Sodium 140,  potassium 3.8, glucose 103, BUN 15, creatinine 0.8.  ESR 7.  Cardiac enzymes  are negative x2.  TSH 1.948.  Total lipids 219, triglycerides 107, HDL 92,  LDL 106.  PTT 27, PT 12.7, INR 0.9.  UA was negative for ketones, glucose,  or infection.  SPEP with immunofixation is pending at this time as are the  results of her 2-D echocardiogram.   CONSULTS:  Melvyn Novas, M.D., neurology.   CONDITION ON DISCHARGE:  Good.   DISPOSITION:  Discharged to home.   FOLLOWUP:  The patient is scheduled for follow-up at [facility] on  September 16 at 2:20 p.m. and September 28 at 10:50 a.m.     Ellender Hose. Christian Mate, M.D.     SMD/MEDQ  D:  05/02/2003  T:  05/02/2003  Job:  [id number]   cc:   Marcelline Mates Dohmeier, M.D.  [address]  [address]  [place]  [place] [address]  Fax: [phone]

## 2011-01-27 IMAGING — CR DG CHEST 2V
2 series · 2 of 2 positions shown · non-contrast
Comparison: 05/16/2009

CLINICAL DATA: Bodyaches.  Shortness of breath.  Cough.  Wheezing.

CHEST - 2 VIEW

[w chest pa]
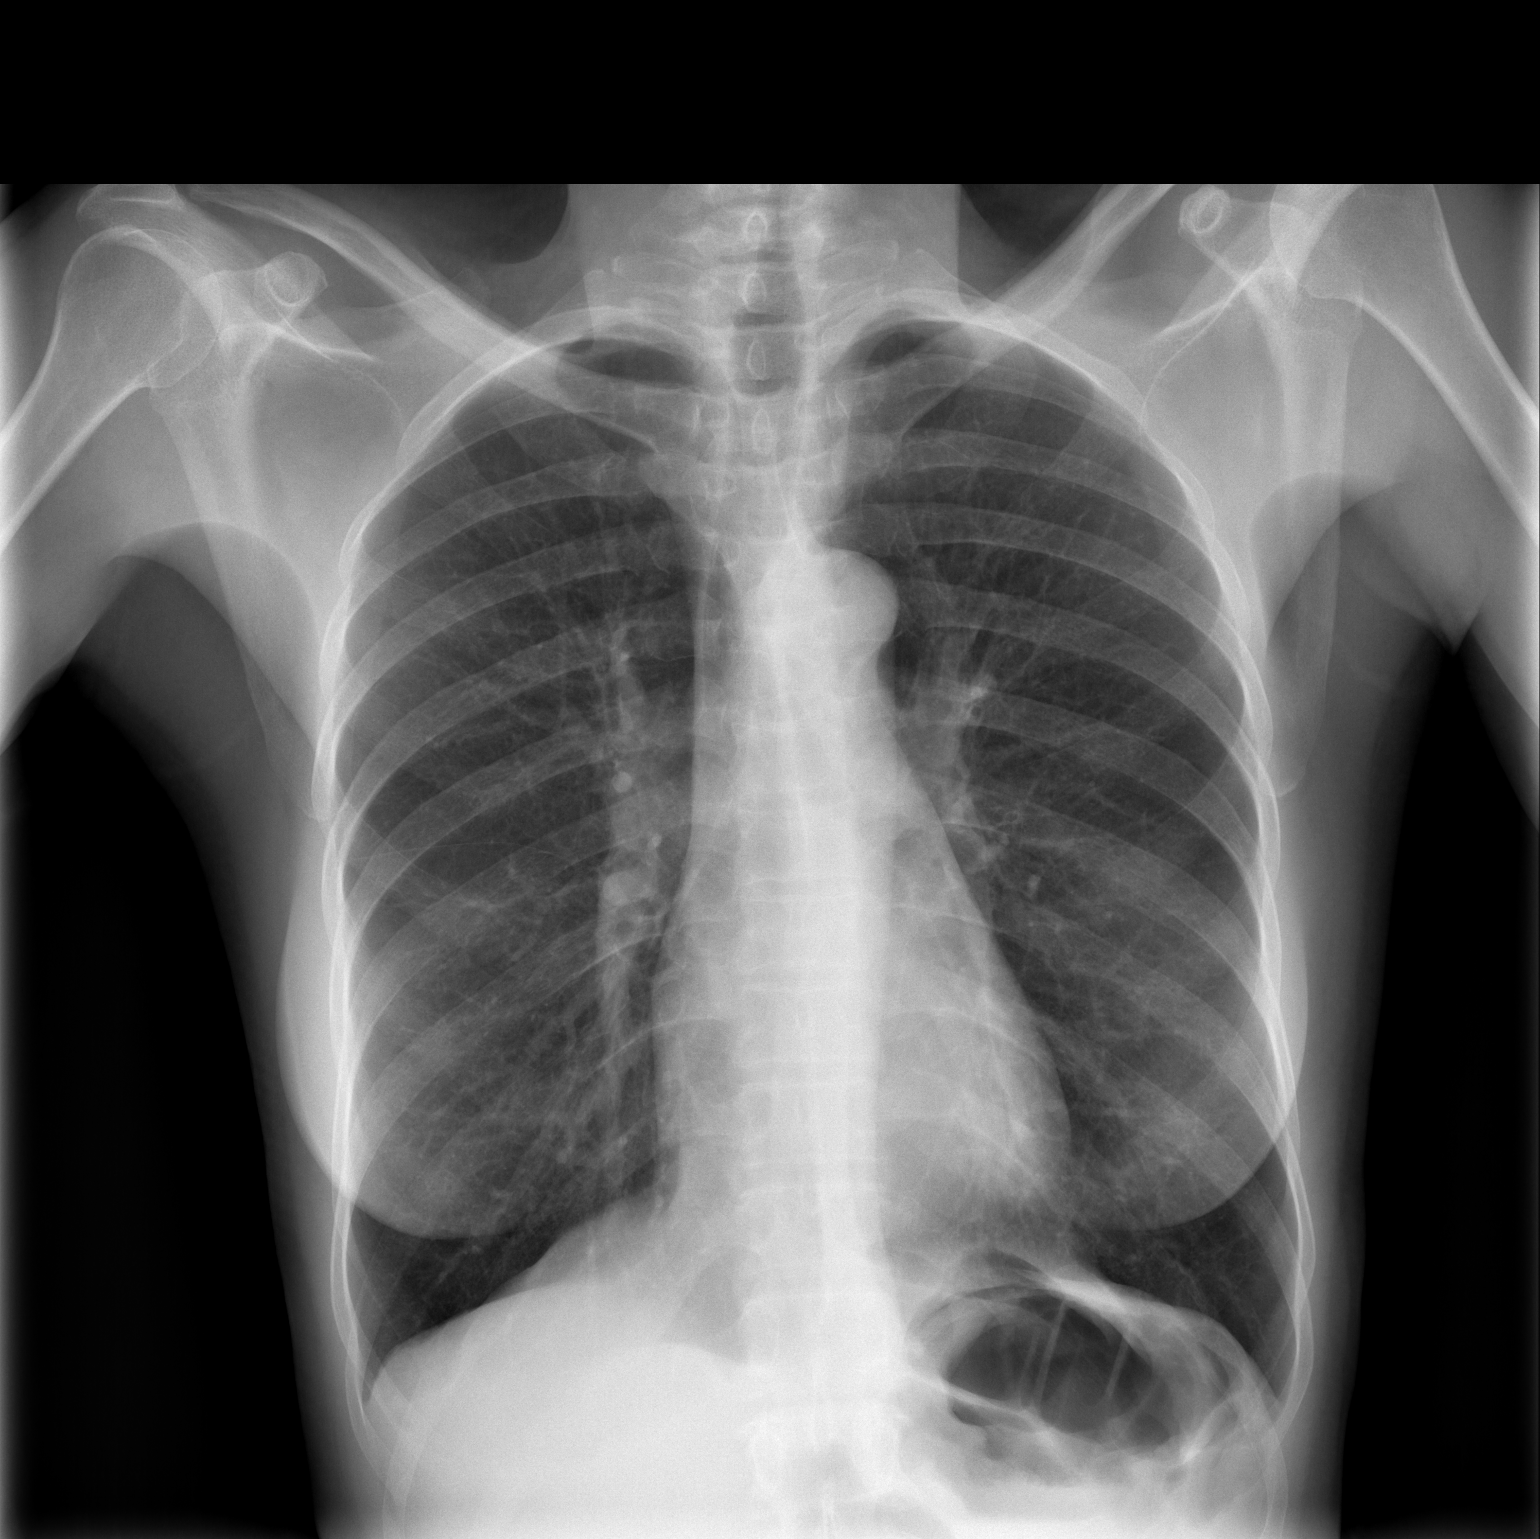

[w chest lat]
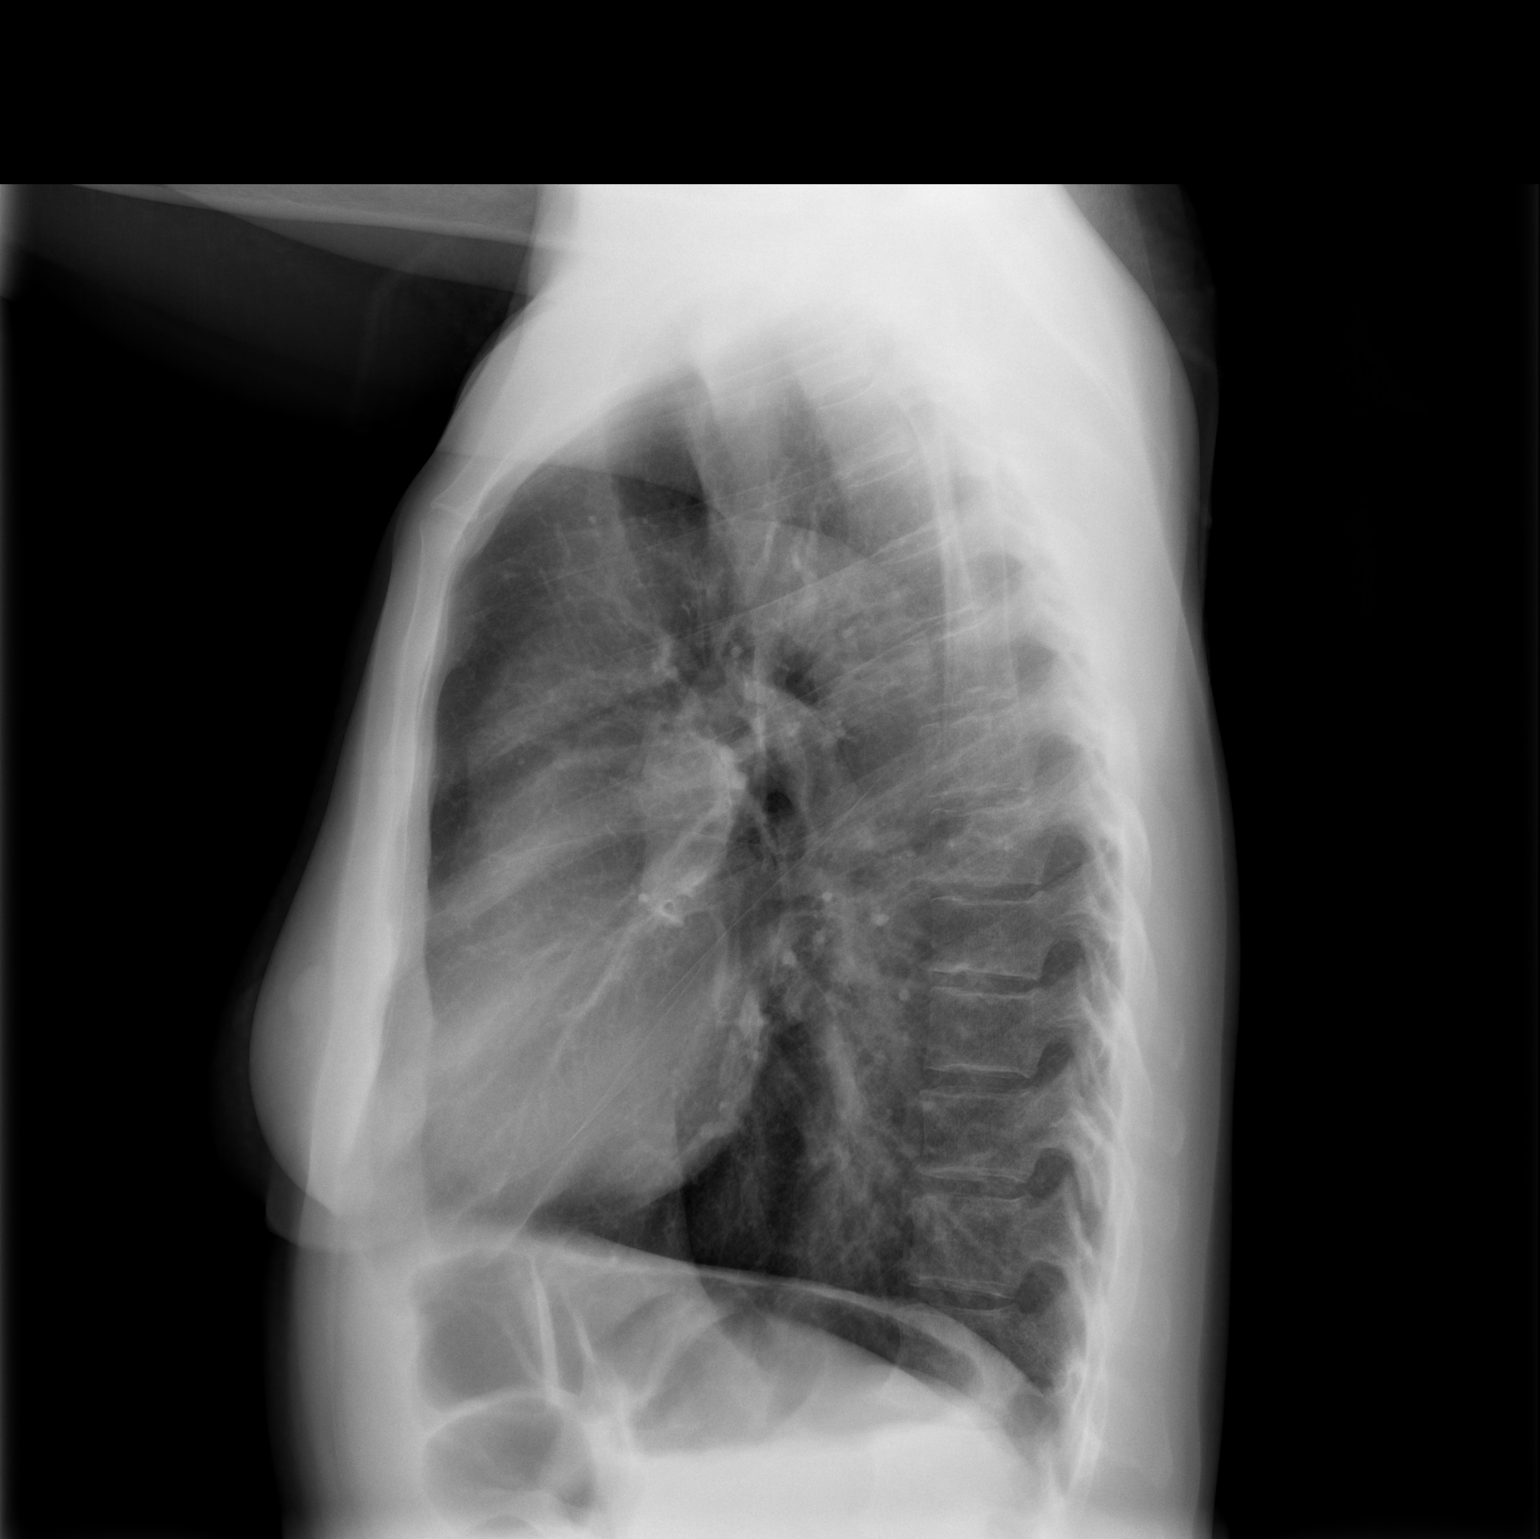

[2 of 2 positions shown; findings below may reference images not displayed]

FINDINGS: Underlying COPD again identified. Midline trachea.
Normal heart size and mediastinal contours. No pleural effusion or
pneumothorax.  Interstitial thickening without focal lung opacity.
IMPRESSION: COPD without acute superimposed process.

## 2011-02-22 ENCOUNTER — Other Ambulatory Visit (HOSPITAL_COMMUNITY): Payer: Self-pay | Admitting: Family Medicine

## 2011-02-22 DIAGNOSIS — R911 Solitary pulmonary nodule: Secondary | ICD-10-CM

## 2011-03-01 ENCOUNTER — Inpatient Hospital Stay (HOSPITAL_COMMUNITY): Admission: RE | Admit: 2011-03-01 | Payer: Self-pay | Source: Ambulatory Visit

## 2011-03-04 ENCOUNTER — Ambulatory Visit (HOSPITAL_COMMUNITY)
Admission: RE | Admit: 2011-03-04 | Discharge: 2011-03-04 | Disposition: A | Discharge status: Home / Self Care | Payer: Self-pay | Source: Ambulatory Visit | Attending: Family Medicine | Admitting: Family Medicine

## 2011-03-04 DIAGNOSIS — R05 Cough: Secondary | ICD-10-CM | POA: Insufficient documentation

## 2011-03-04 DIAGNOSIS — Q278 Other specified congenital malformations of peripheral vascular system: Secondary | ICD-10-CM | POA: Insufficient documentation

## 2011-03-04 DIAGNOSIS — R911 Solitary pulmonary nodule: Secondary | ICD-10-CM

## 2011-03-04 DIAGNOSIS — J984 Other disorders of lung: Secondary | ICD-10-CM | POA: Insufficient documentation

## 2011-03-04 DIAGNOSIS — R059 Cough, unspecified: Secondary | ICD-10-CM | POA: Insufficient documentation

## 2011-03-04 DIAGNOSIS — Z09 Encounter for follow-up examination after completed treatment for conditions other than malignant neoplasm: Secondary | ICD-10-CM | POA: Insufficient documentation

## 2011-03-19 IMAGING — CR DG CHEST 2V
3 series · 3 of 3 positions shown · non-contrast
Comparison: 10/11/2009

CLINICAL DATA: Shortness of breath and chest pain.  History of
asthma.

CHEST - 2 VIEW

[w chest pa]
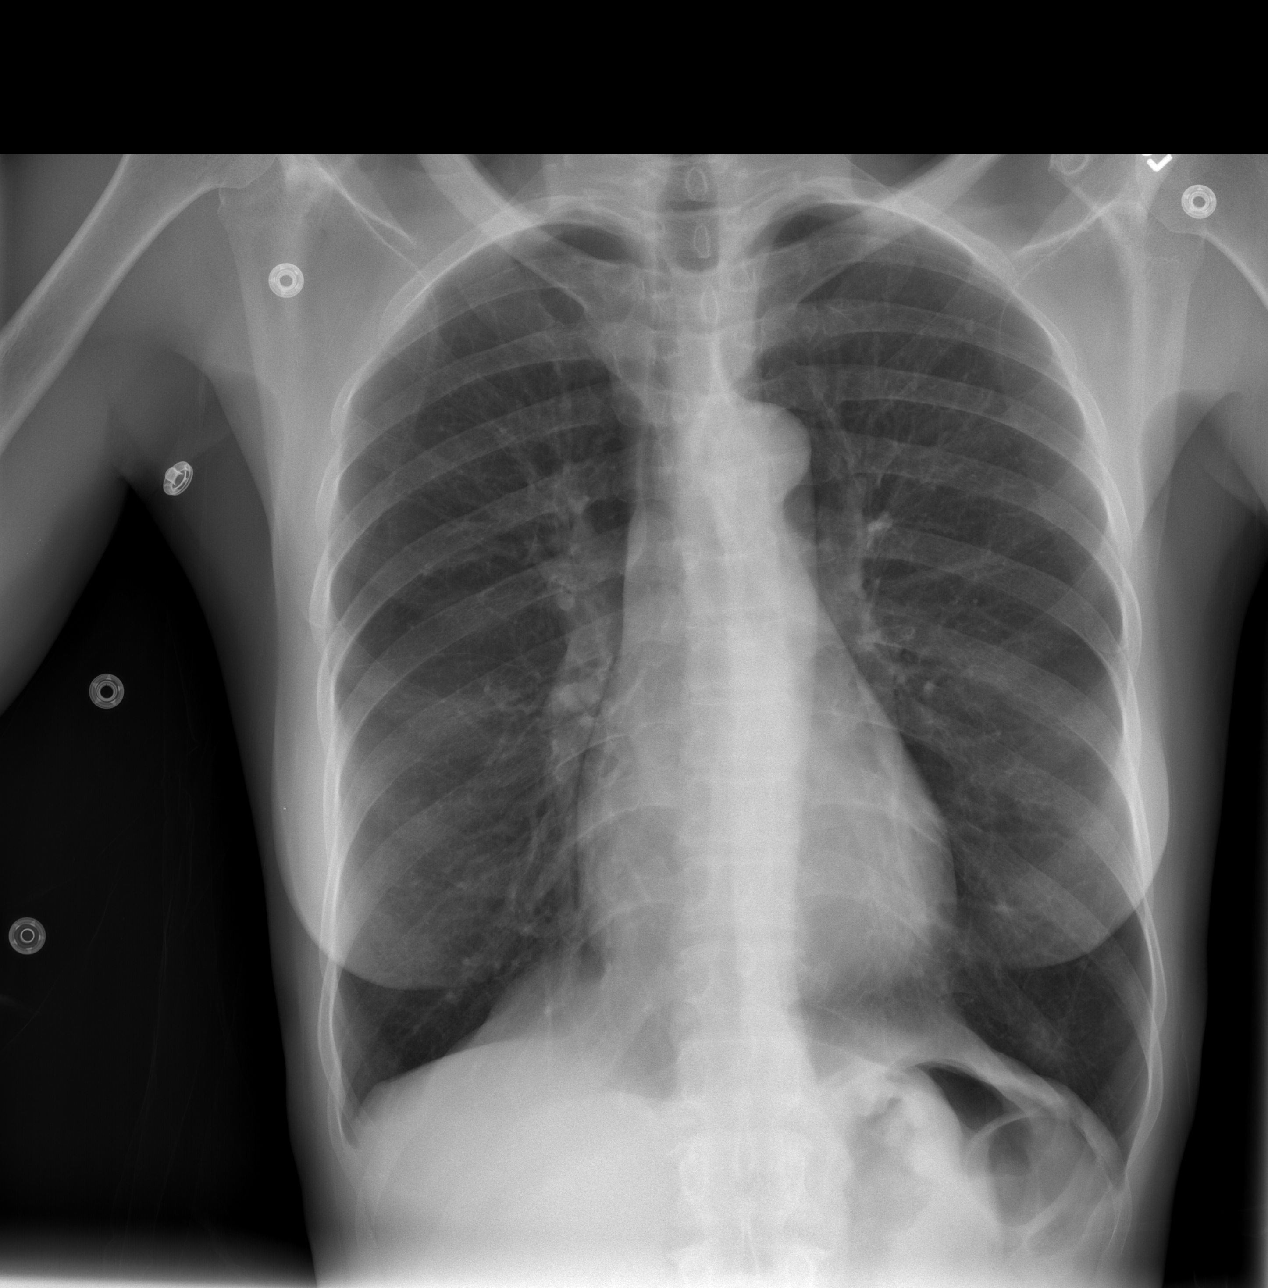

[w chest lat (1 of 2)]
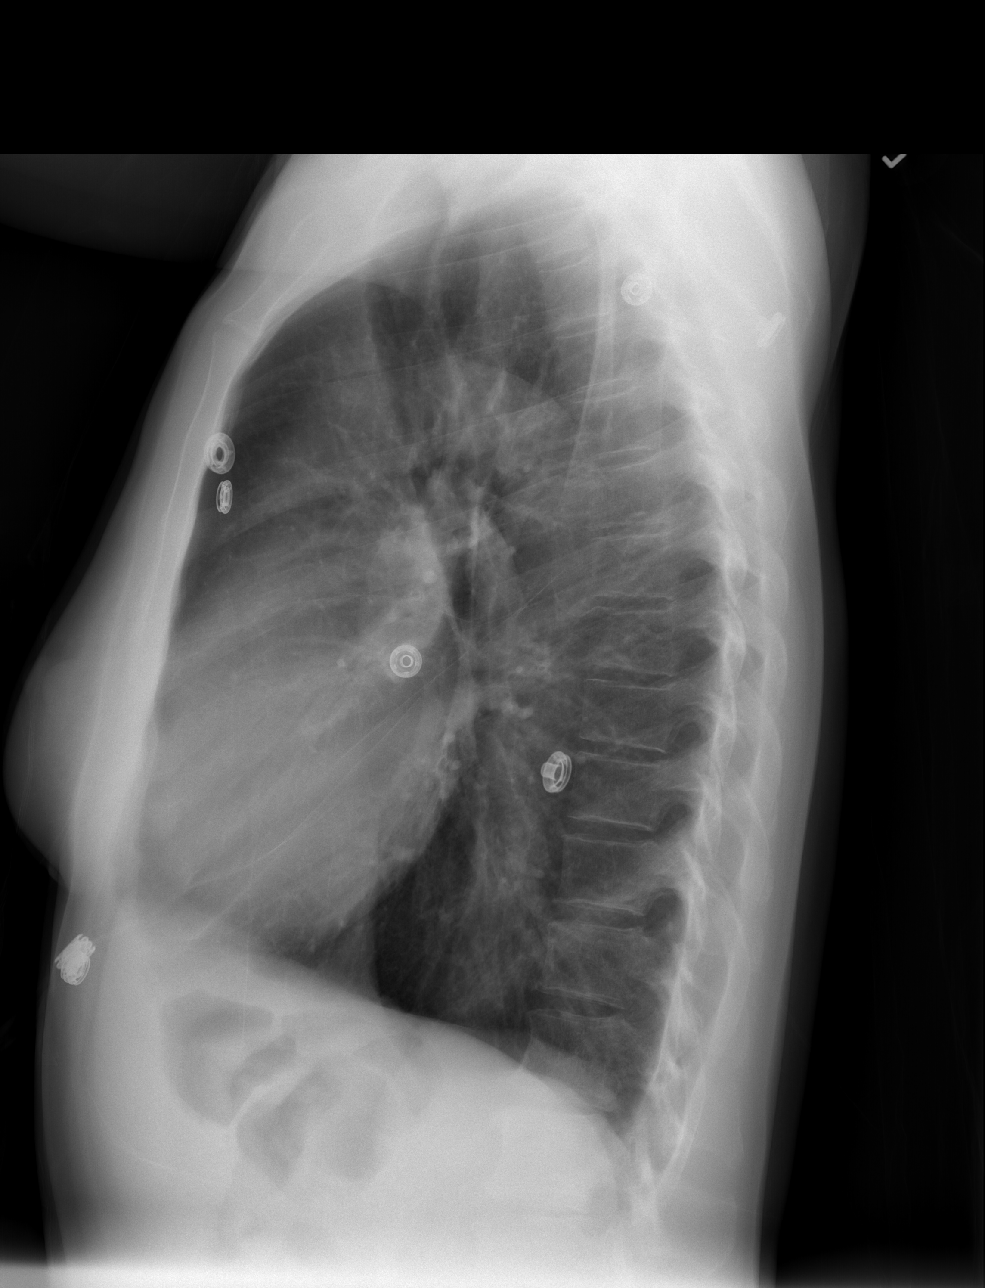

[w chest lat (2 of 2)]
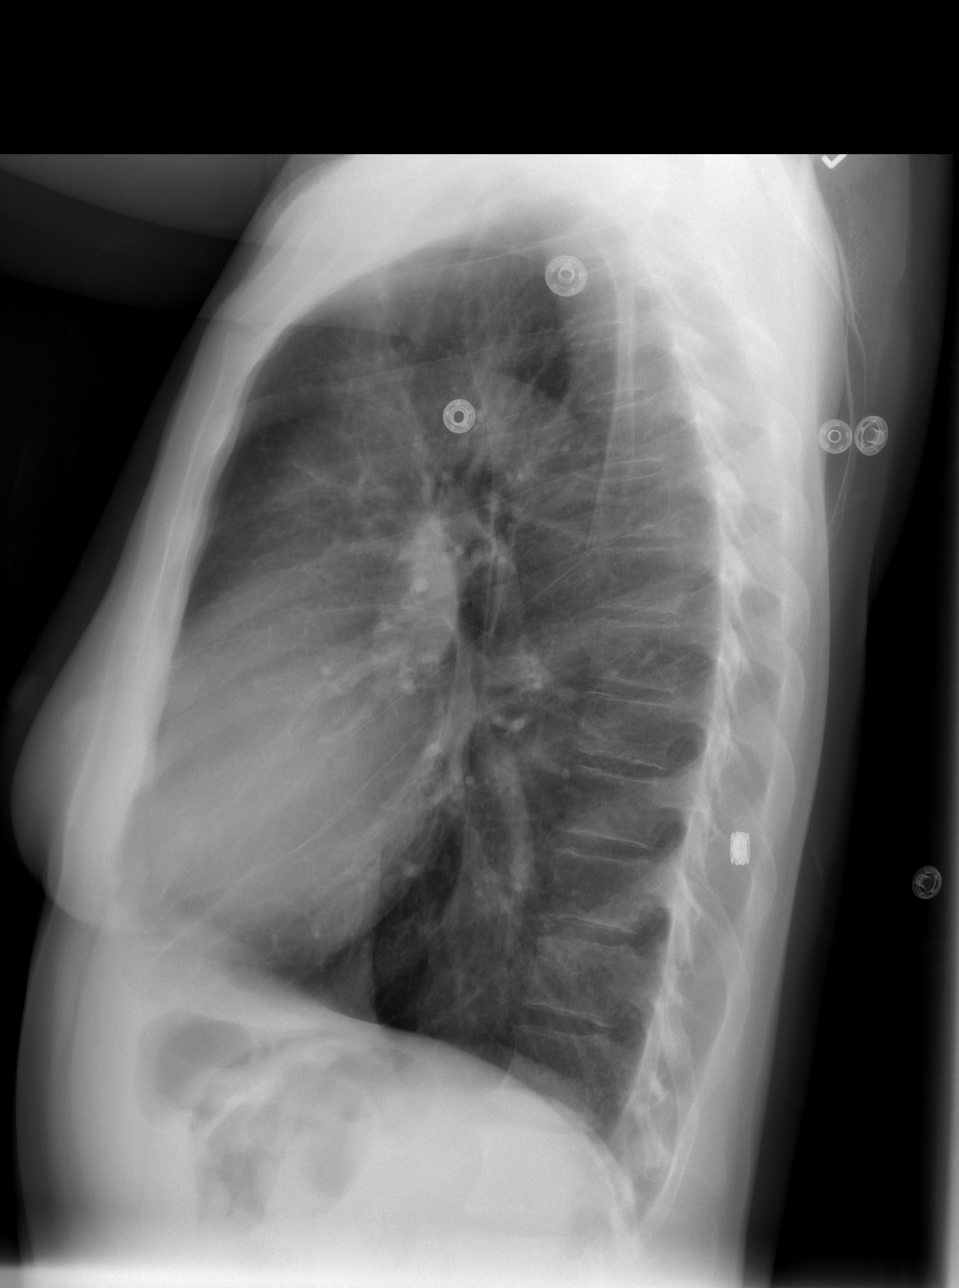

[3 of 3 positions shown; findings below may reference images not displayed]

FINDINGS: The cardiac silhouette, mediastinal and hilar contours
are within normal limits and stable.  The lungs demonstrate
hyperinflation but no infiltrates, edema or effusions.  The bony
thorax is intact.
IMPRESSION: Stable hyperinflation.  No acute pulmonary findings.

## 2011-04-25 IMAGING — CR DG CHEST 1V PORT
1 series · 1 of 1 positions shown · non-contrast
Comparison: Portable exam 7922 hours compared to 12/01/2009

CLINICAL DATA: Shortness of breath, history of asthma

PORTABLE CHEST - 1 VIEW

[view not recorded]
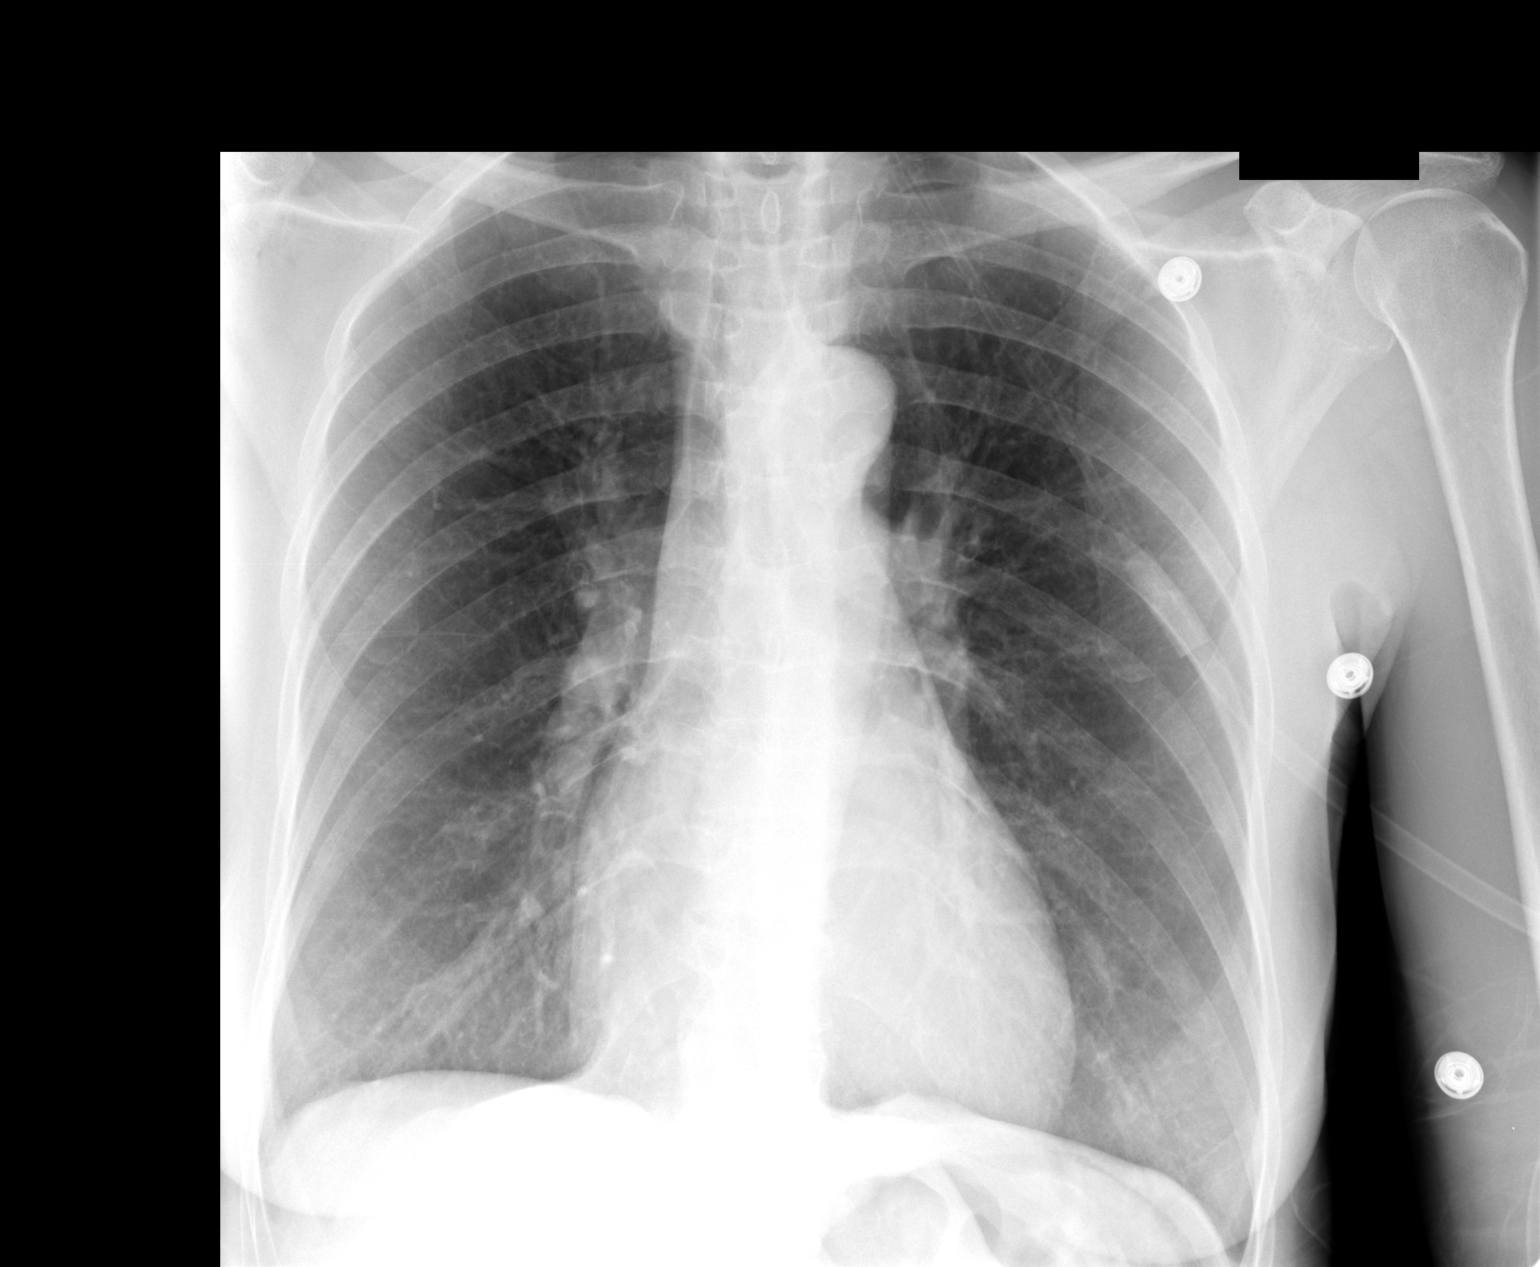

[1 of 1 positions shown; findings below may reference images not displayed]

FINDINGS: Upper normal heart size.
Normal mediastinal contours and pulmonary vascularity.
Slightly hyperexpanded lungs, clear.
Minimal peribronchial thickening.
No pleural effusion or pneumothorax.
No acute bony findings.
IMPRESSION: Hyperinflated lungs with minimal peribronchial thickening,
consistent with patient history of asthma.
No acute infiltrate.

## 2011-04-29 IMAGING — CR DG CHEST 2V
2 series · 2 of 2 positions shown · non-contrast
Comparison: 01/07/2010

CLINICAL DATA: Shortness of breath

CHEST - 2 VIEW

[w chest pa]
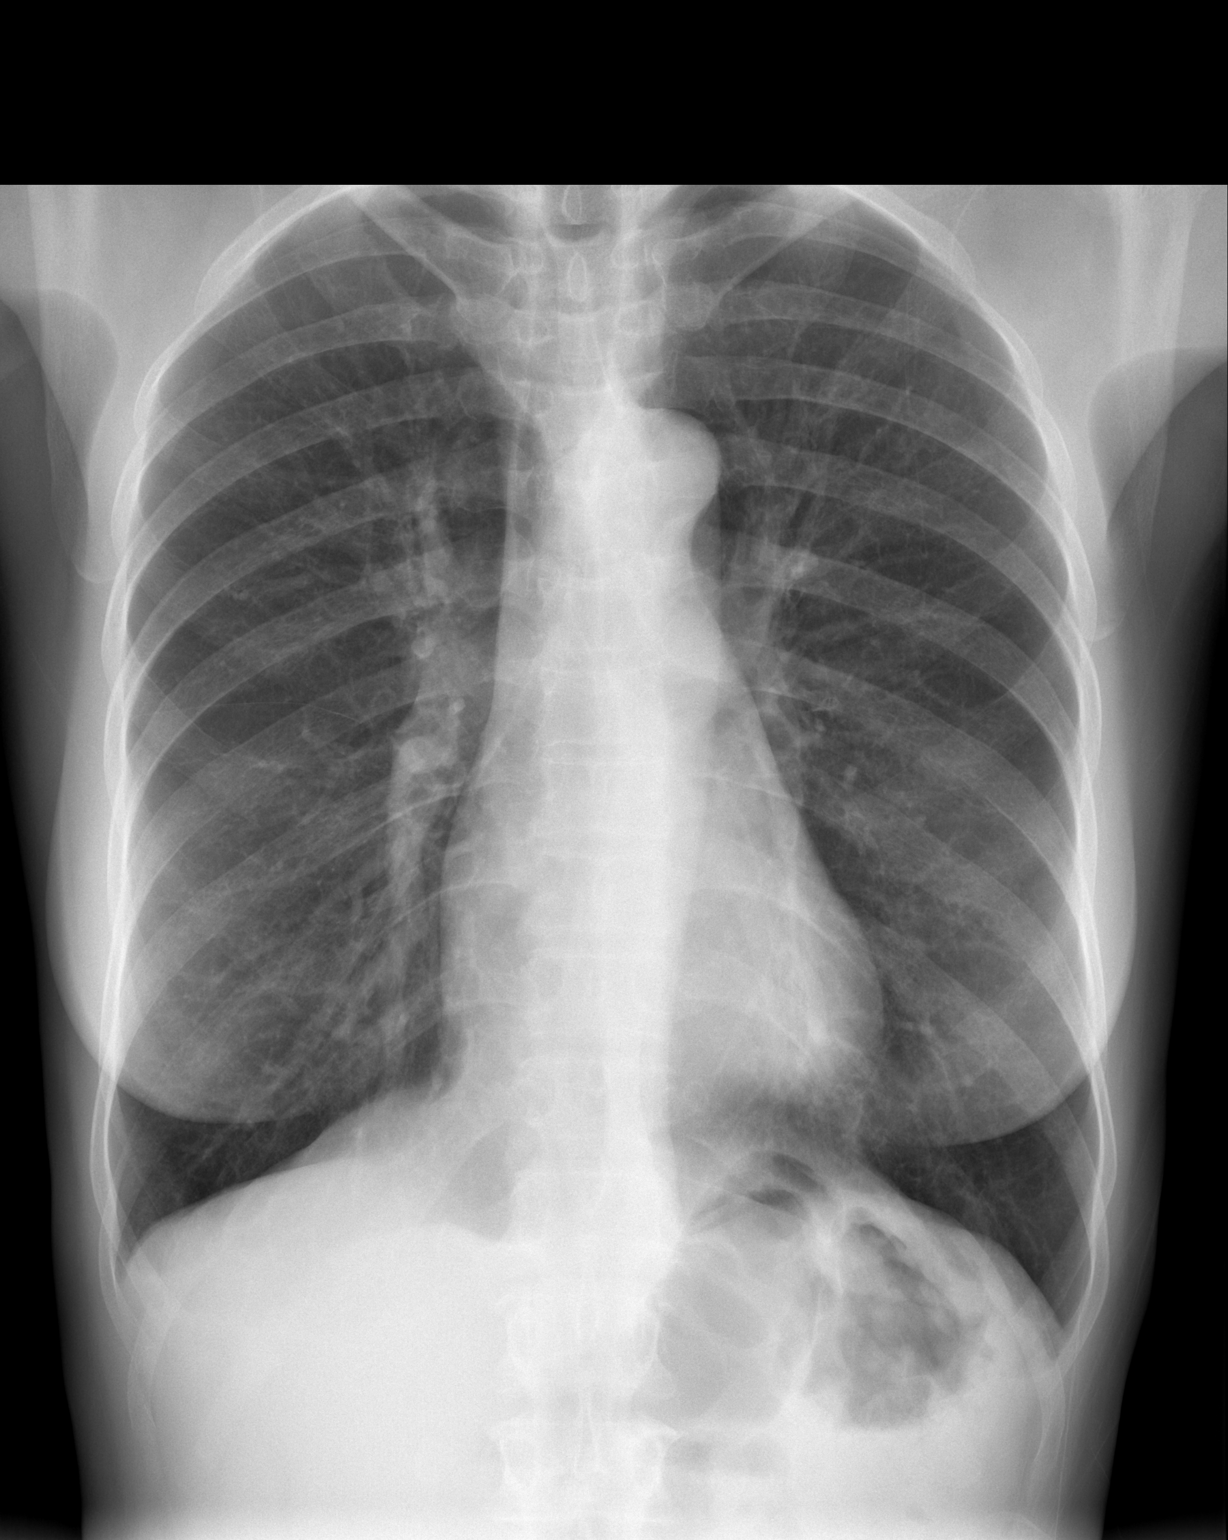

[w chest lat]
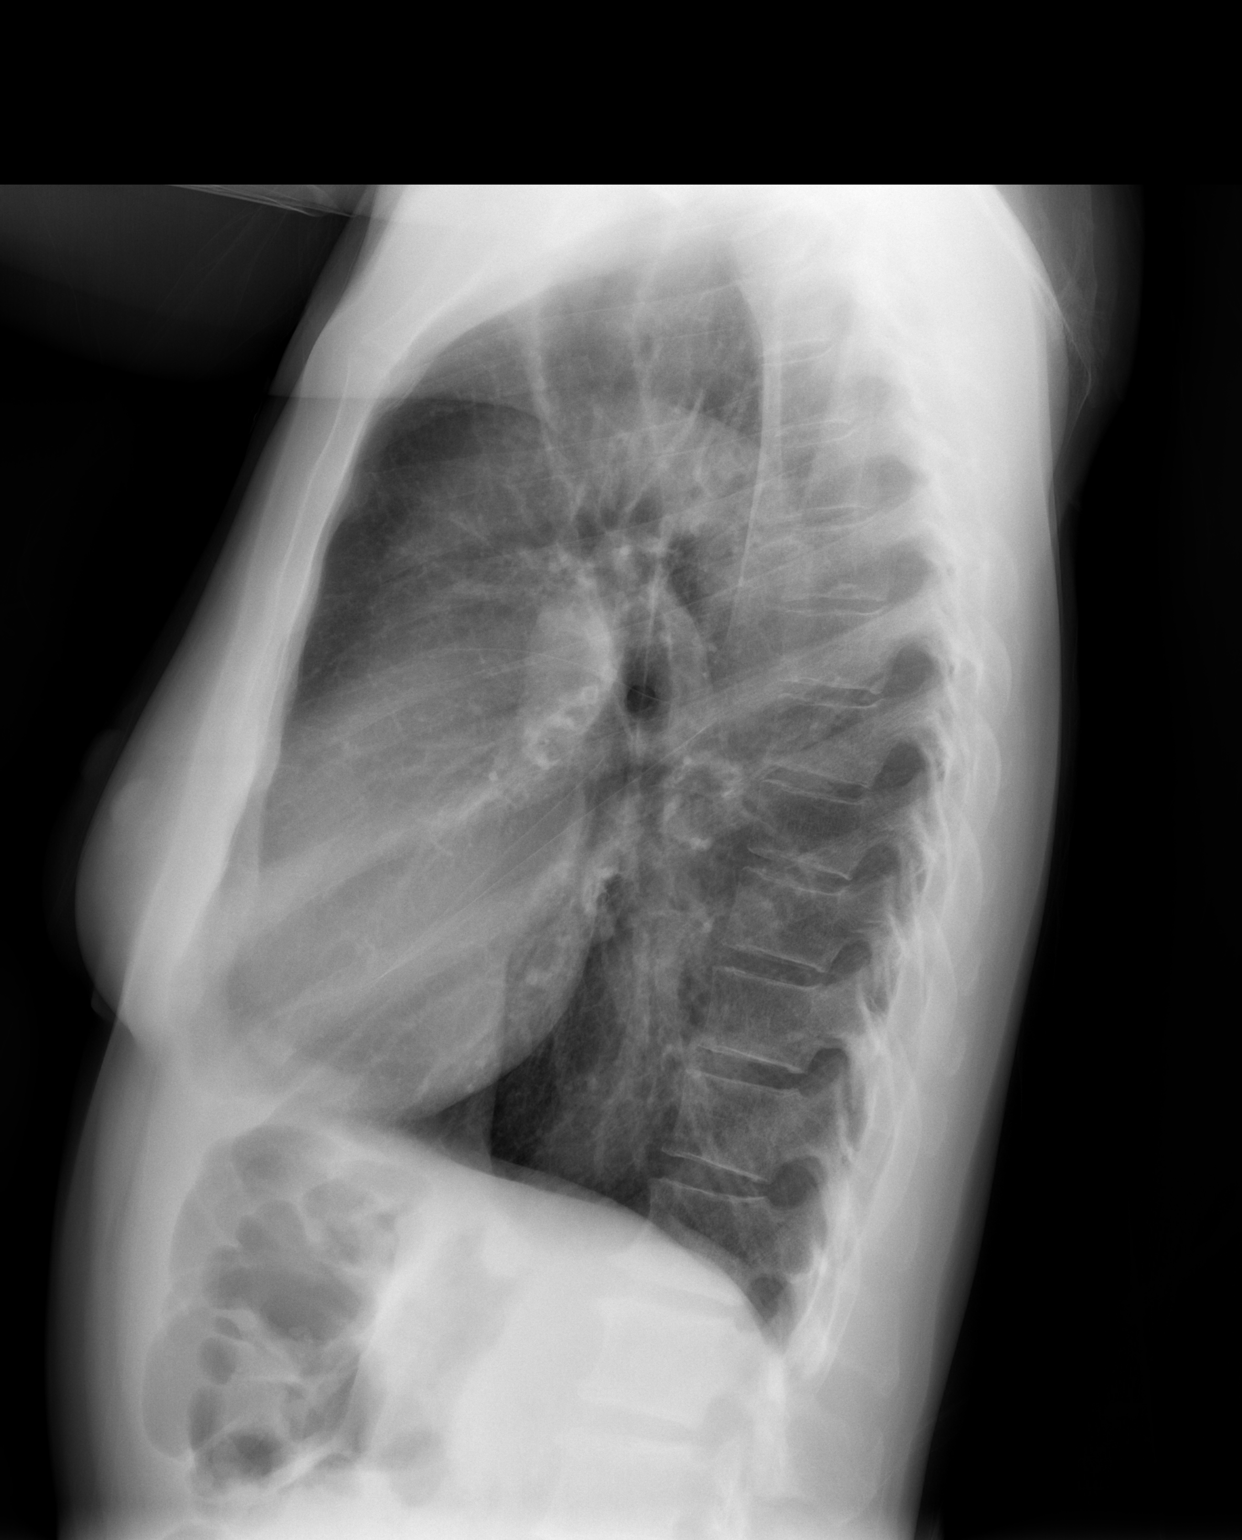

[2 of 2 positions shown; findings below may reference images not displayed]

FINDINGS: Lungs are hyperinflated. Lungs clear.  Heart size and
pulmonary vascularity normal.  No effusion.  Visualized bones
unremarkable.
IMPRESSION: Hyperinflation without acute or superimposed abnormality.

## 2011-05-26 LAB — URINE MICROSCOPIC-ADD ON

## 2011-05-26 LAB — POCT I-STAT, CHEM 8
BUN: 19
Calcium, Ion: 1.2
Chloride: 107
Creatinine, Ser: 0.8
Sodium: 140

## 2011-05-26 LAB — URINALYSIS, ROUTINE W REFLEX MICROSCOPIC
Bilirubin Urine: NEGATIVE
Glucose, UA: NEGATIVE
Hgb urine dipstick: NEGATIVE
Ketones, ur: NEGATIVE
Specific Gravity, Urine: 1.028
pH: 6
pH: 7

## 2011-06-10 LAB — CBC
Hemoglobin: 13.4
Hemoglobin: 12.8
MCHC: 33.1
MCHC: 33.6
MCV: 81.3
RBC: 4.99
RBC: 4.7

## 2011-06-10 LAB — URINE MICROSCOPIC-ADD ON

## 2011-06-10 LAB — BASIC METABOLIC PANEL
CO2: 26
Chloride: 103
GFR calc Af Amer: >60
Glucose, Bld: 108 — ABNORMAL HIGH
Sodium: 139

## 2011-06-10 LAB — DIFFERENTIAL
Basophils Relative: 1
Eosinophils Absolute: 0.2
Eosinophils Relative: 5
Lymphocytes Relative: 25
Monocytes Absolute: 0.4
Monocytes Absolute: 0.4
Monocytes Relative: 9
Monocytes Relative: 9
Neutro Abs: 2.8

## 2011-06-10 LAB — I-STAT 8, (EC8 V) (CONVERTED LAB)
BUN: 8
Chloride: 105
HCT: 42
Hemoglobin: 14.3
Operator id: 277751
Potassium: 2.8 — ABNORMAL LOW
Sodium: 138

## 2011-06-10 LAB — URINALYSIS, ROUTINE W REFLEX MICROSCOPIC
Glucose, UA: NEGATIVE
Ketones, ur: NEGATIVE
Protein, ur: 30 — AB
Protein, ur: 30 — AB
Urobilinogen, UA: 0.2

## 2011-06-10 LAB — POCT PREGNANCY, URINE
Operator id: 173591
Preg Test, Ur: NEGATIVE

## 2011-06-29 IMAGING — CR DG ABDOMEN 1V
1 series · 1 of 1 positions shown · non-contrast
Comparison: 07/07/2008

CLINICAL DATA: 59-year-old female with right-sided abdominal pain

ABDOMEN - 1 VIEW

[t abdomen supine]
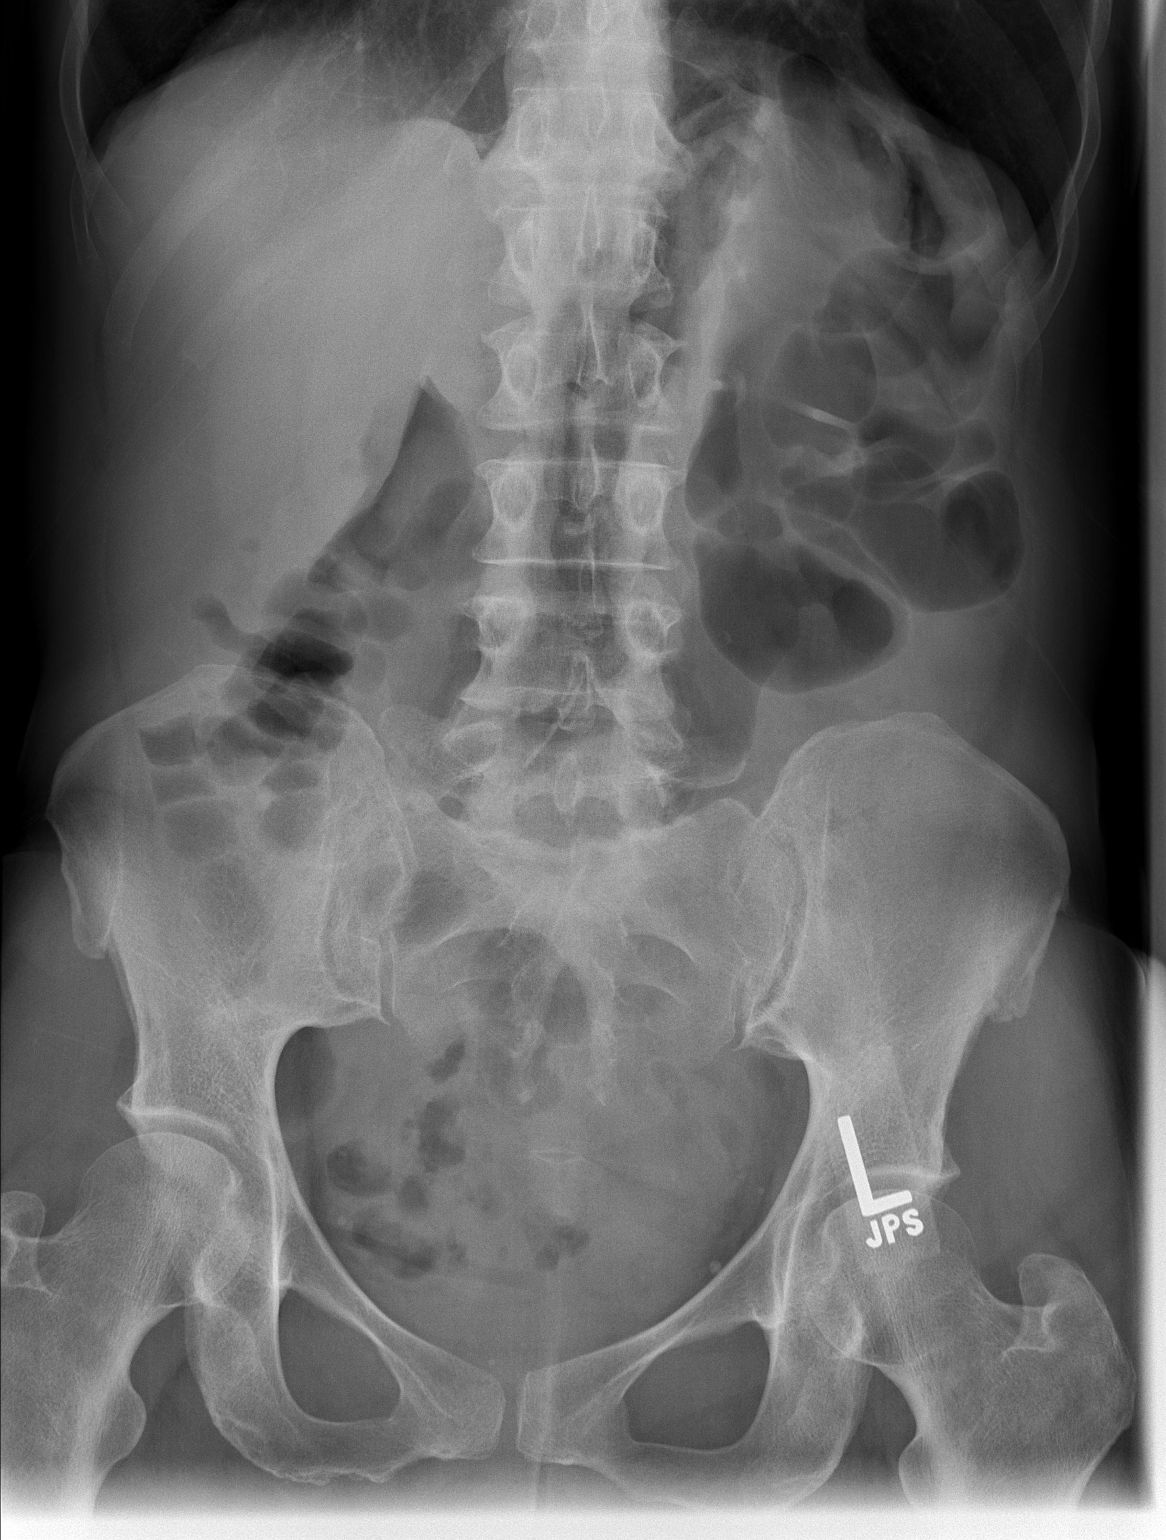

[1 of 1 positions shown; findings below may reference images not displayed]

FINDINGS: No dilated loops of small bowel are seen in the abdomen.
Gas and stool are seen throughout the colon.  No intra-abdominal
free air is present.  The lung bases are clear.  The osseous
structures are within normal limits.
IMPRESSION: Nonobstructive bowel gas pattern.

## 2011-07-10 IMAGING — CR DG CHEST 1V PORT
2 series · 2 of 2 positions shown · non-contrast
Comparison: Chest radiograph performed 01/11/2010

CLINICAL DATA: Shortness of breath; respiratory distress.

PORTABLE CHEST - 1 VIEW

[view not recorded (1 of 2)]
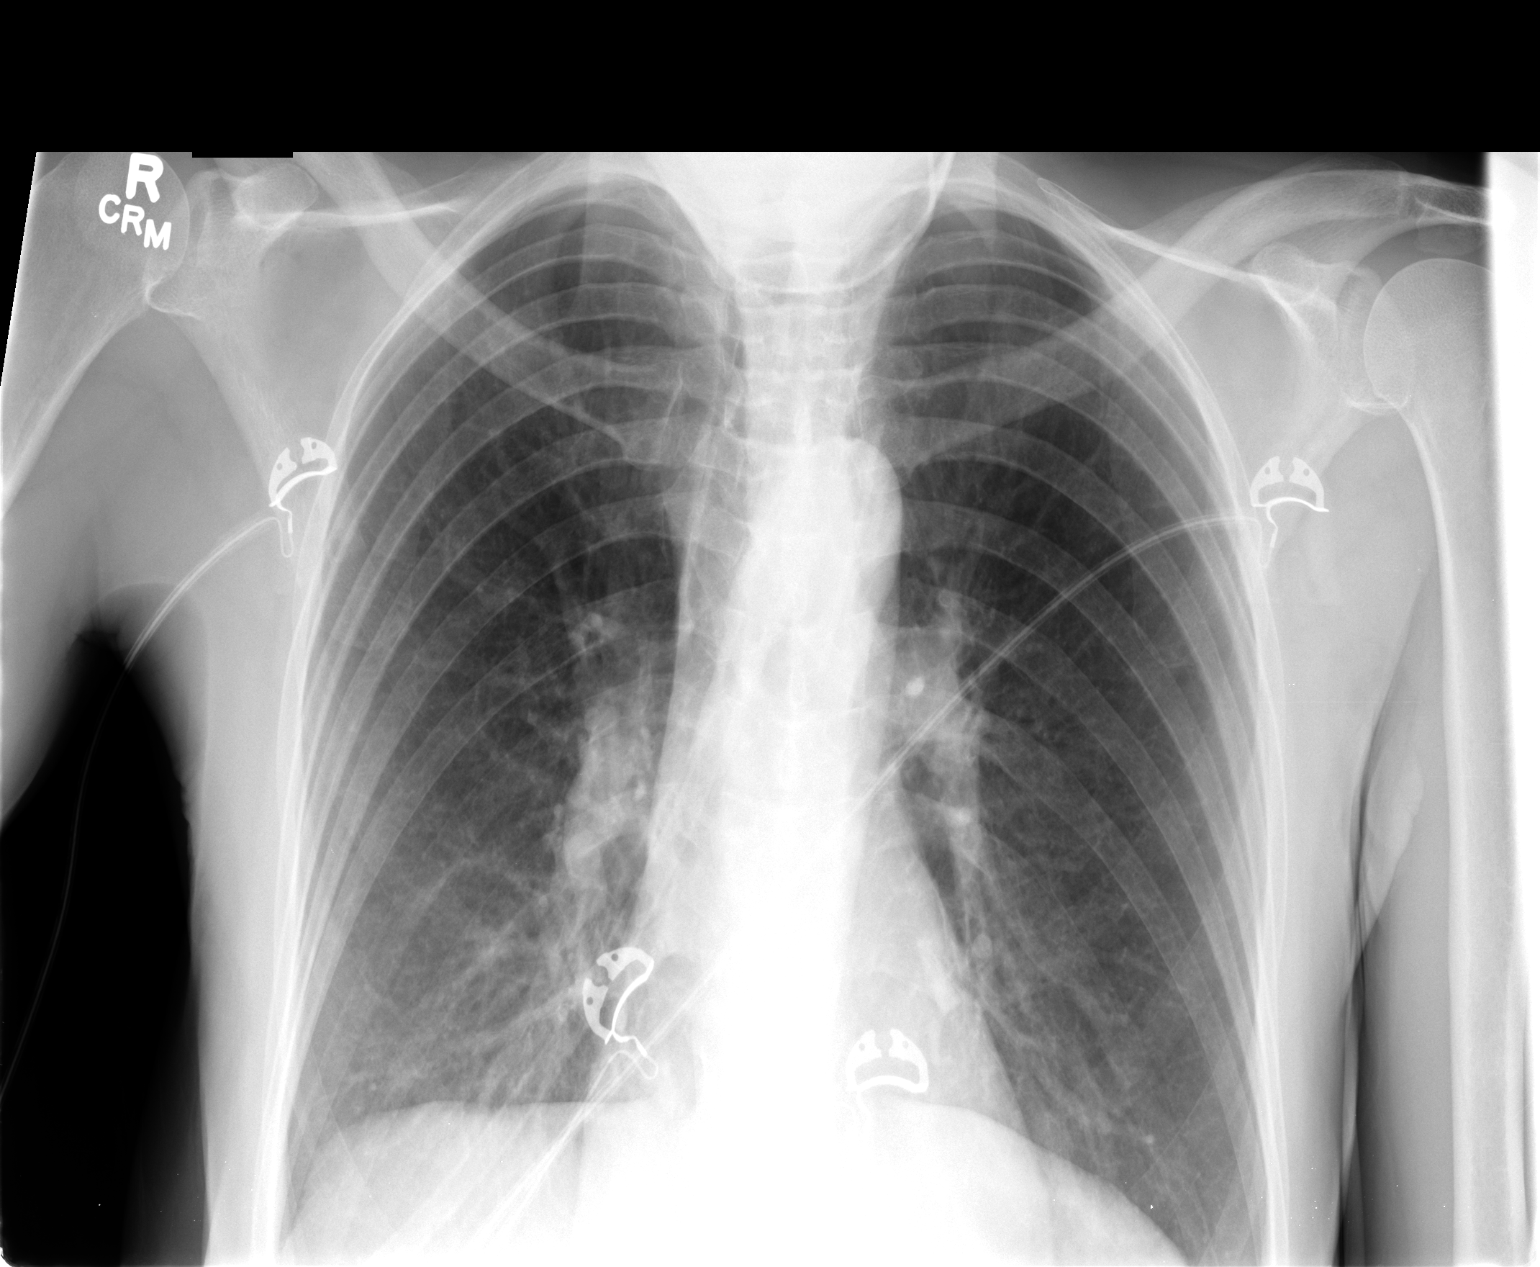

[view not recorded (2 of 2)]
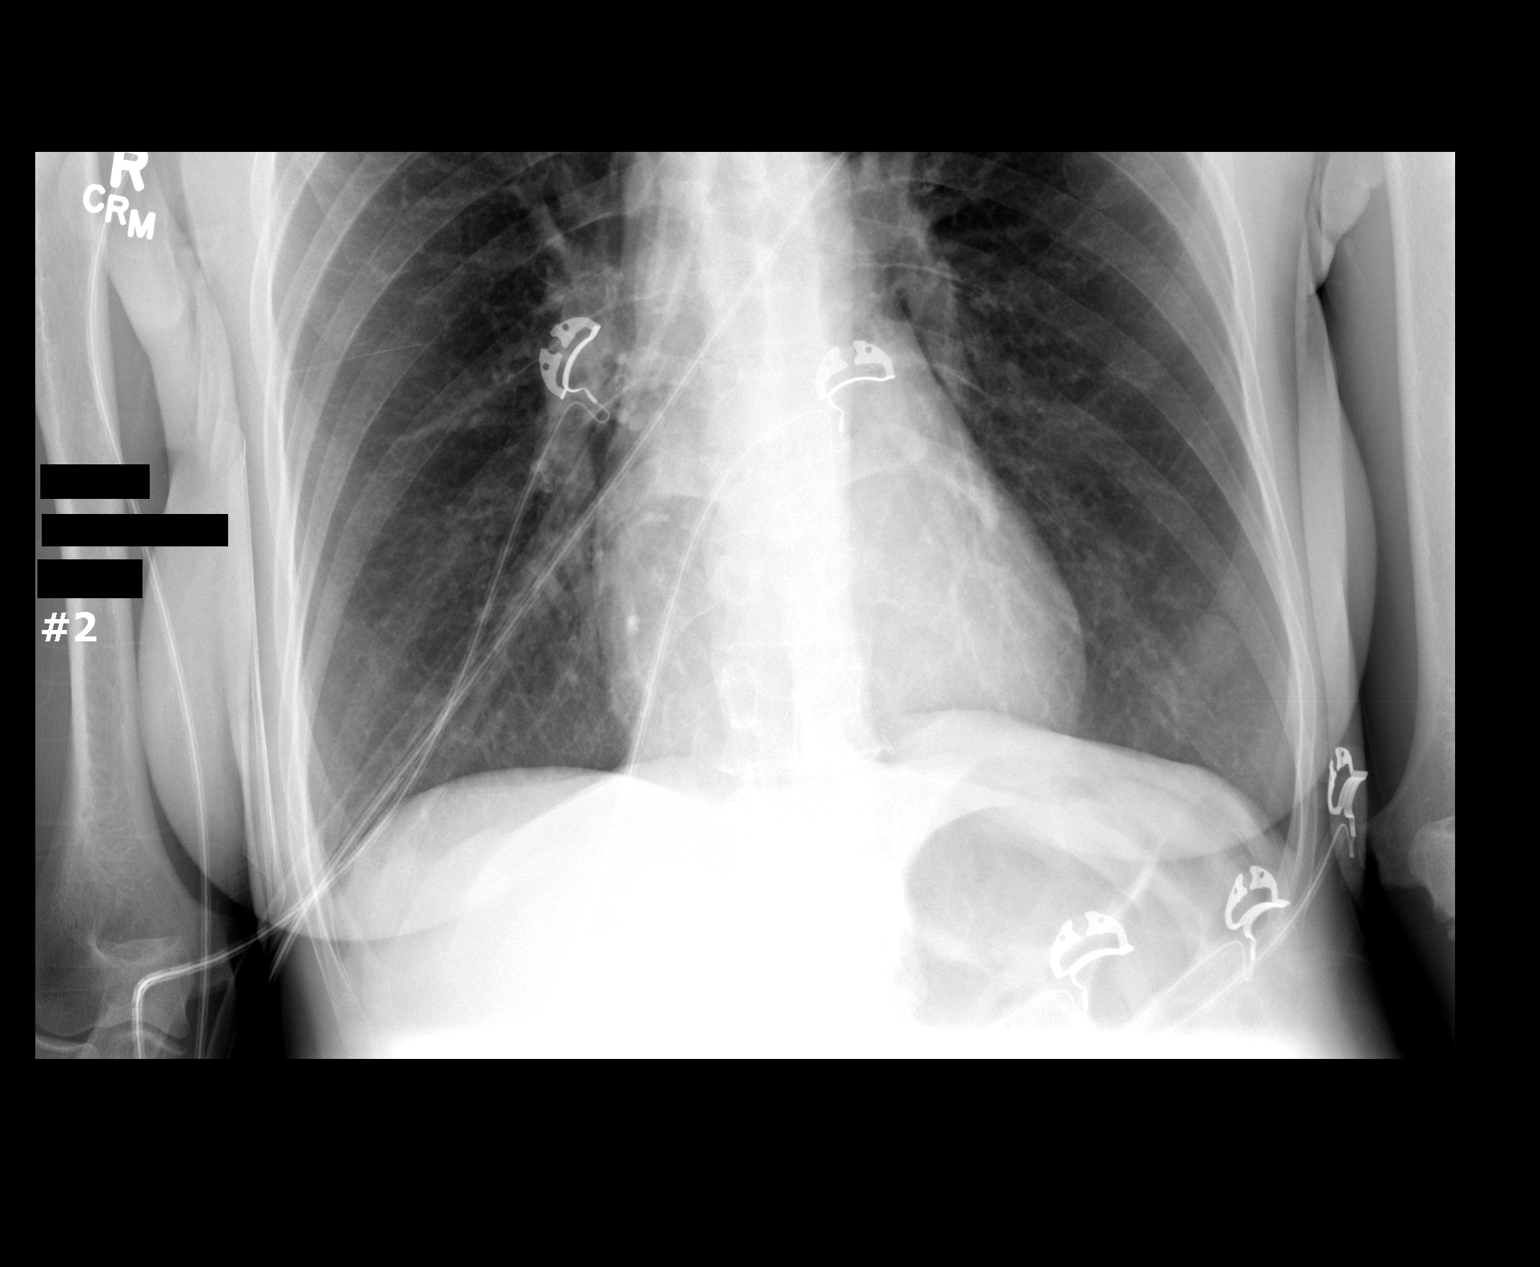

[2 of 2 positions shown; findings below may reference images not displayed]

FINDINGS: The lungs are hyperexpanded, with flattening of the
hemidiaphragms, compatible with COPD.  There is no evidence of
focal opacification, pleural effusion or pneumothorax.

The cardiomediastinal silhouette is within normal limits.  No acute
osseous abnormalities are seen.
IMPRESSION: Findings of COPD; lungs otherwise clear.

## 2011-07-10 IMAGING — CT CT ANGIO CHEST
2 of 6 series · 19 of 36 positions shown · IV contrast (APPLIED)
Comparison: 03/24/2010 chest x-ray.

CLINICAL DATA: Shortness of breath.  Questionable pulmonary
embolus.

CT ANGIOGRAPHY CHEST WITH CONTRAST
TECHNIQUE: Multidetector CT imaging of the chest was performed
using the standard protocol during bolus administration of
intravenous contrast.  Multiplanar CT image reconstructions
including MIPs were obtained to evaluate the vascular anatomy.
Contrast:  100 ml of Imnipaque-Y22.

[Series 8: pulm embolism 1.0 b25f thins · axial · 0.59mm/px · z∈[-342,-51]mm · 18 of 325 slices shown]
[im 17/325  lung]
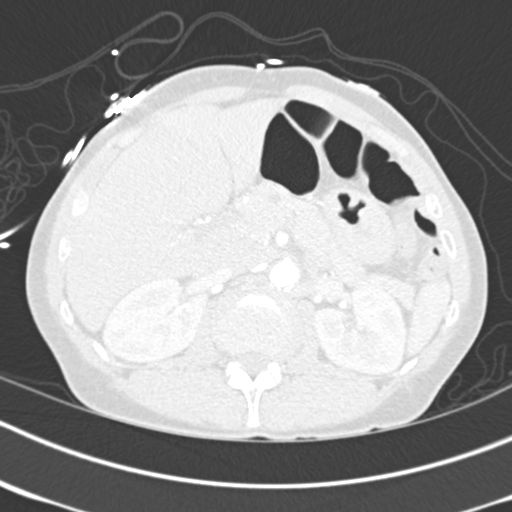
[im 33/325  mediastinal]
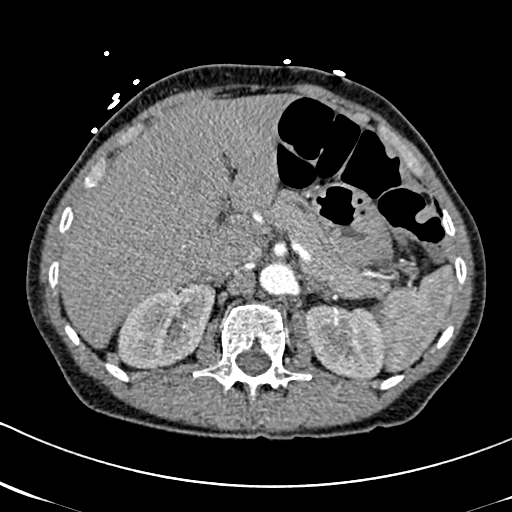
[im 49/325  lung]
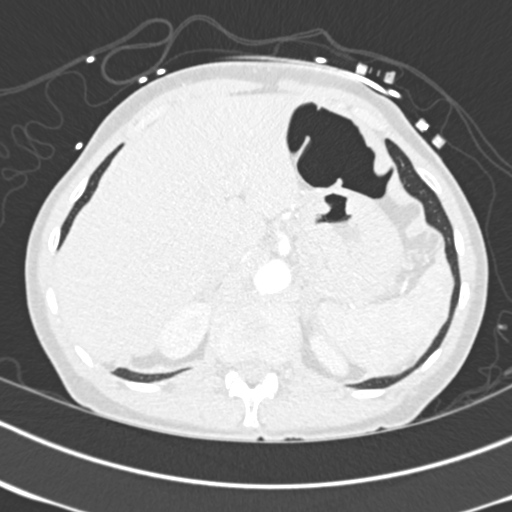
[im 65/325  mediastinal]
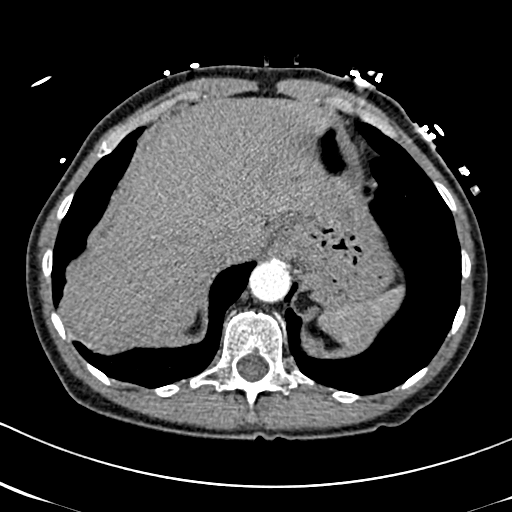
[im 82/325  lung]
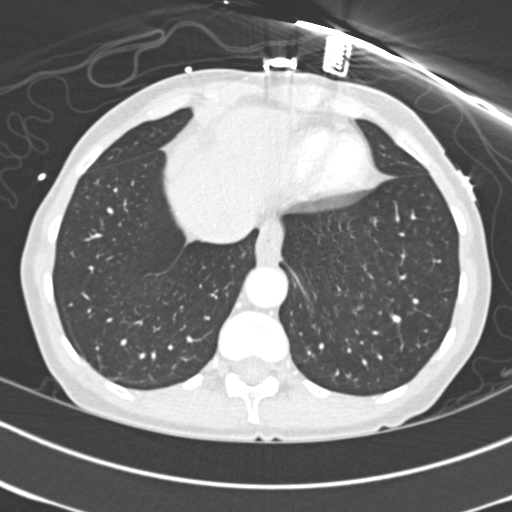
[im 98/325  mediastinal]
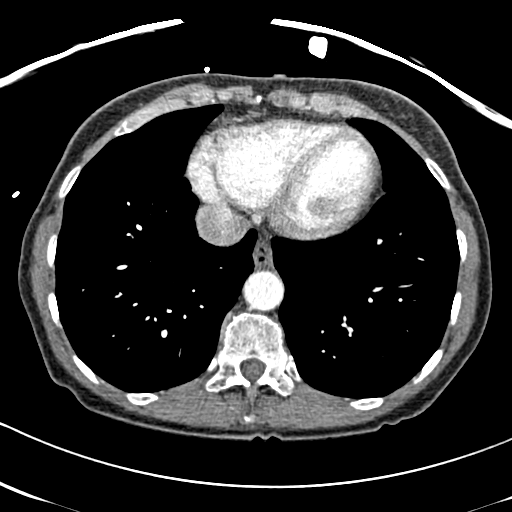
[im 114/325  lung]
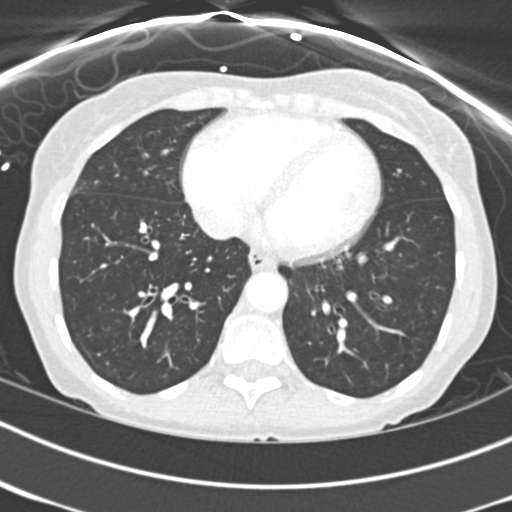
[im 130/325  mediastinal]
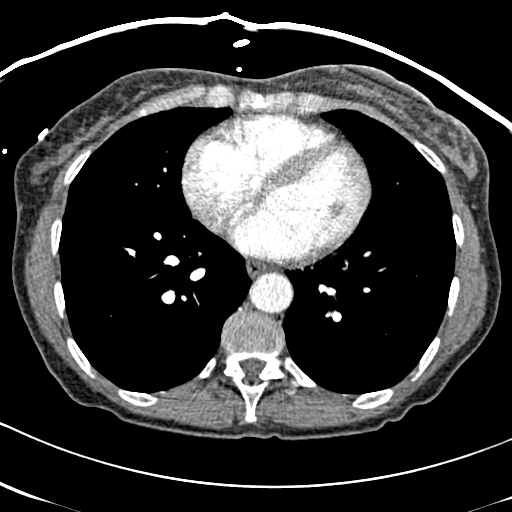
[im 146/325  lung]
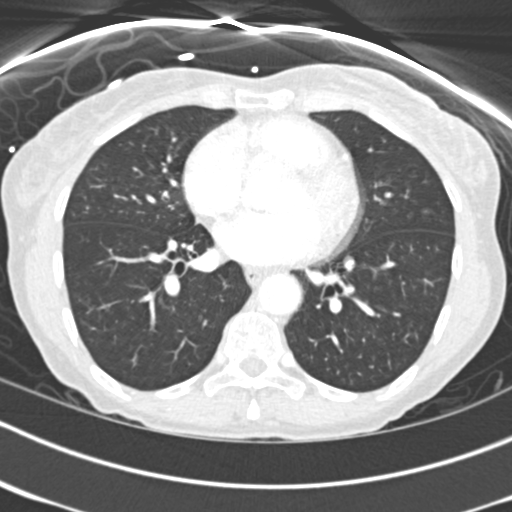
[im 179/325  mediastinal]
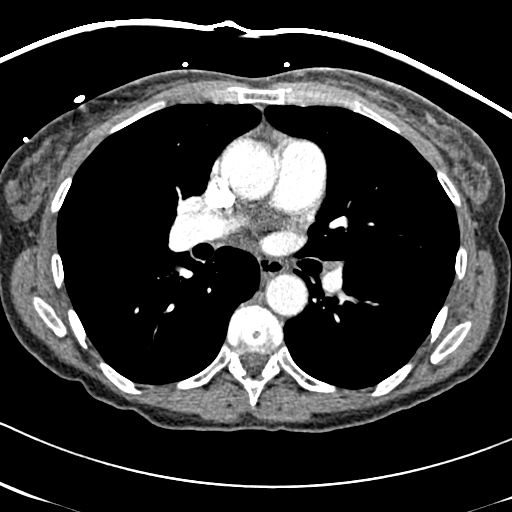
[im 195/325  lung]
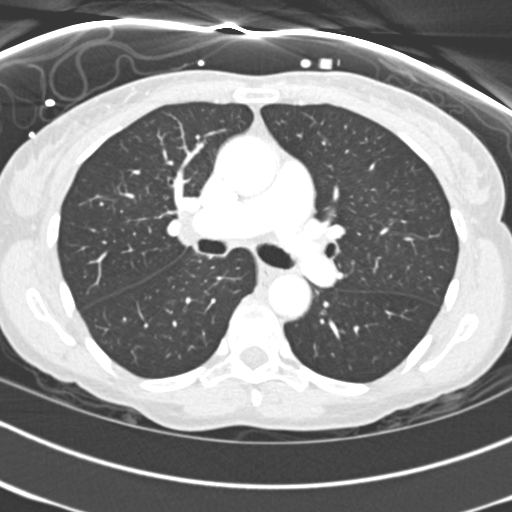
[im 211/325  mediastinal]
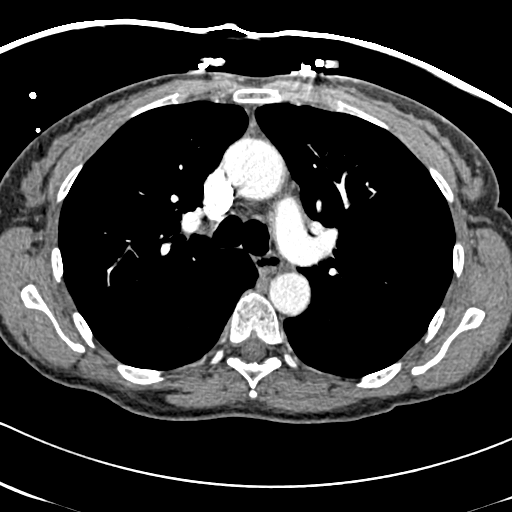
[im 227/325  lung]
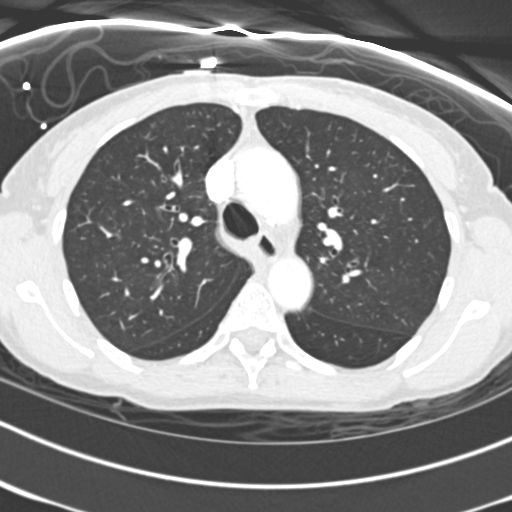
[im 244/325  mediastinal]
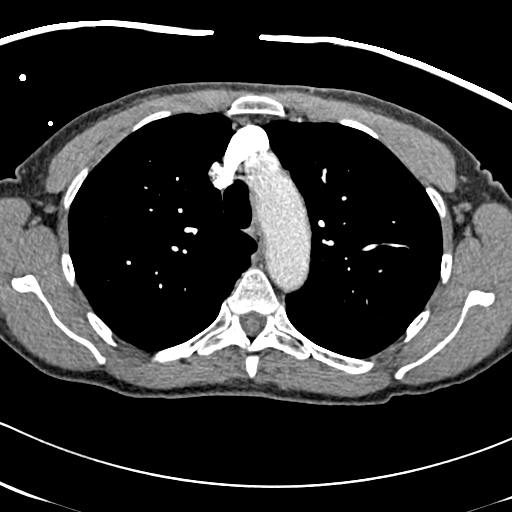
[im 260/325  lung]
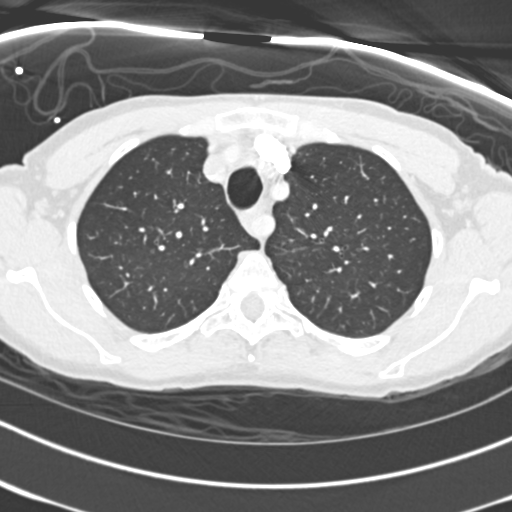
[im 276/325  mediastinal]
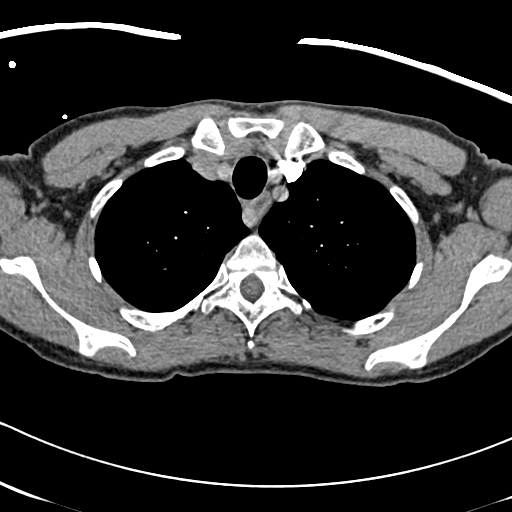
[im 292/325  lung]
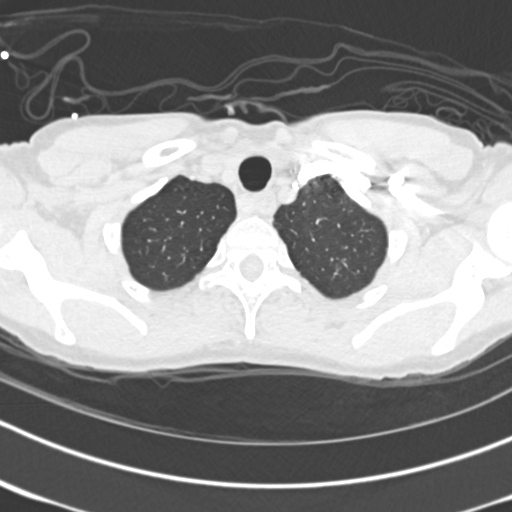
[im 308/325  mediastinal]
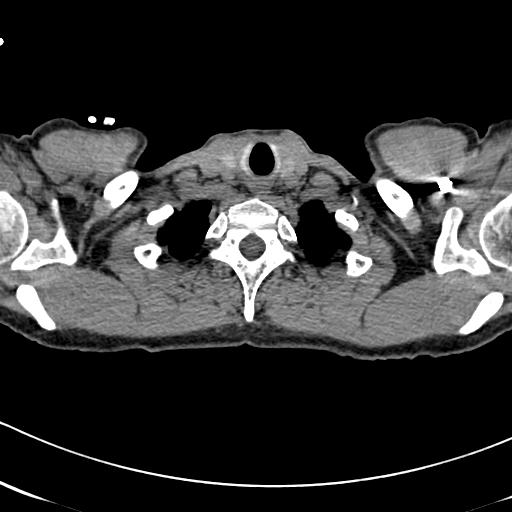

[Series 602: <mprcoronals · coronal · 0.63mm/px · 1 of 92 slices shown]
[im 46/92  mediastinal]
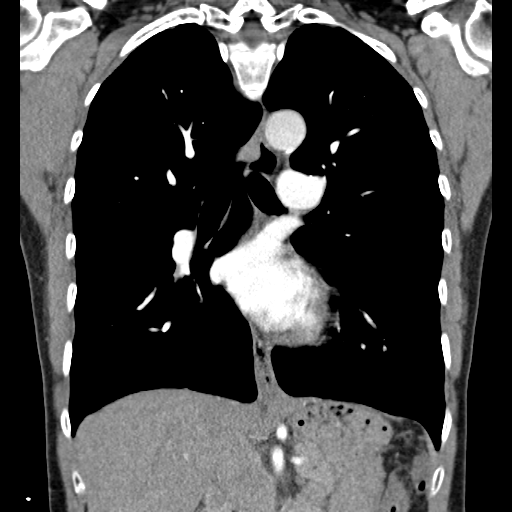

[19 of 36 positions shown; findings below may reference images not displayed]

FINDINGS: There is no CT scan evidence for pulmonary emboli.  Lung
window settings demonstrates a 7 mm relatively low density nodule
within the posteromedial right lower lobe (image 69 series 5).
There is also subtle patchy air space disease seen within the
inferior lateral right middle lobe.  As this low density right
lower lobe nodule may represent an nflammatory process, I recommend
follow-up chest CT scan in 4-6 weeks with attention to this area to
be certain that this resolves.  There is no mediastinal or hilar
adenopathy.  The adrenal glands have a normal appearance.

Review of the MIP images confirms the above findings.
IMPRESSION: 1.  No CT scan evidence for pulmonary emboli.
2.  Relatively low density 7 mm right lower lobe nodule with slight
patchy air space disease seen within the inferior lateral right
middle lobe. As this may represent an inflammatory process,
recommend follow-up chest CT scan in 4-6 weeks to be certain that
this nodule clears / resolves.

## 2011-10-20 ENCOUNTER — Emergency Department (HOSPITAL_COMMUNITY)
Admission: EM | Admit: 2011-10-20 | Discharge: 2011-10-20 | Disposition: A | Discharge status: Home / Self Care | Payer: Self-pay | Attending: Emergency Medicine | Admitting: Emergency Medicine

## 2011-10-20 ENCOUNTER — Encounter (HOSPITAL_COMMUNITY): Payer: Self-pay | Admitting: *Deleted

## 2011-10-20 DIAGNOSIS — F17200 Nicotine dependence, unspecified, uncomplicated: Secondary | ICD-10-CM | POA: Insufficient documentation

## 2011-10-20 DIAGNOSIS — J45909 Unspecified asthma, uncomplicated: Secondary | ICD-10-CM | POA: Insufficient documentation

## 2011-10-20 DIAGNOSIS — J45901 Unspecified asthma with (acute) exacerbation: Secondary | ICD-10-CM

## 2011-10-20 DIAGNOSIS — I10 Essential (primary) hypertension: Secondary | ICD-10-CM | POA: Insufficient documentation

## 2011-10-20 HISTORY — DX: Essential (primary) hypertension: I10

## 2011-10-20 MED ORDER — PREDNISONE 20 MG PO TABS
60.0000 mg | ORAL_TABLET | Freq: Once | ORAL | Status: AC
  Administered 2011-10-20: 60 mg via ORAL
  Filled 2011-10-20: qty 3

## 2011-10-20 MED ORDER — PREDNISONE 10 MG PO TABS: 20.0000 mg | ORAL_TABLET | Freq: Every day | ORAL | Status: DC

## 2011-10-20 MED ORDER — ALBUTEROL SULFATE HFA 108 (90 BASE) MCG/ACT IN AERS
2.0000 | INHALATION_SPRAY | Freq: Once | RESPIRATORY_TRACT | Status: AC
  Administered 2011-10-20: 2 via RESPIRATORY_TRACT
  Filled 2011-10-20: qty 6.7

## 2011-10-20 MED ORDER — ALBUTEROL SULFATE (5 MG/ML) 0.5% IN NEBU
5.0000 mg | INHALATION_SOLUTION | Freq: Once | RESPIRATORY_TRACT | Status: AC
  Administered 2011-10-20: 5 mg via RESPIRATORY_TRACT
  Filled 2011-10-20: qty 1

## 2011-10-20 MED ORDER — IPRATROPIUM BROMIDE 0.02 % IN SOLN
0.5000 mg | Freq: Once | RESPIRATORY_TRACT | Status: AC
  Administered 2011-10-20: 0.5 mg via RESPIRATORY_TRACT
  Filled 2011-10-20: qty 2.5

## 2011-10-20 NOTE — ED Notes (Signed)
Reports hx of asthma, having recent cold symptoms and now no relief with inhaler. No resp distress noted at triage, able to speak in full sentences.

## 2011-10-20 NOTE — ED Notes (Signed)
Pt alert and oriented x 4. Pt ambulatory at discharge 

## 2011-10-20 NOTE — ED Provider Notes (Signed)
History     CSN: [id number]  Arrival date & time 10/20/11  7829   First MD Initiated Contact with Patient 10/20/11 [phone]      Chief Complaint  Patient presents with  . Asthma    (Consider location/radiation/quality/duration/timing/severity/associated sxs/prior treatment) Patient is a 61 y.o. female presenting with asthma. The history is provided by the patient. No language interpreter was used.  Asthma This is a recurrent problem. The current episode started in the past 7 days. The problem has been gradually worsening. Associated symptoms include congestion and coughing. Pertinent negatives include no abdominal pain, chest pain, chills, diaphoresis, fever, headaches, nausea, sore throat or vomiting. The symptoms are aggravated by smoking, coughing and sneezing. She has tried NSAIDs for the symptoms. The treatment provided mild relief.  Reports cough and congestion x 3 days with increased wheezing since yesterday. Recent rhinnorhea,  with post nasal drip and bilateral ear pain.  Took her blood pressure medication this am but her b/p is elevated.  No fever pmhof COPD smoker.  Past Medical History  Diagnosis Date  . Asthma   . Hypertension     History reviewed. No pertinent past surgical history.  History reviewed. No pertinent family history.  History  Substance Use Topics  . Smoking status: Current Some Day Smoker  . Smokeless tobacco: Not on file  . Alcohol Use: No    OB History    Grav Para Term Preterm Abortions TAB SAB Ect Mult Living                  Review of Systems  Constitutional: Negative for fever, chills and diaphoresis.  HENT: Positive for ear pain, congestion, rhinorrhea, sneezing and postnasal drip. Negative for hearing loss, sore throat, trouble swallowing and ear discharge.   Respiratory: Positive for cough and wheezing. Negative for shortness of breath.   Cardiovascular: Negative for chest pain.  Gastrointestinal: Negative for nausea, vomiting,  abdominal pain and abdominal distention.  Genitourinary: Negative.   Musculoskeletal: Negative.   Skin: Negative.   Neurological: Negative for dizziness and headaches.  Psychiatric/Behavioral: Negative.   All other systems reviewed and are negative.    Allergies  Celecoxib  Home Medications   Current Outpatient Rx  Name Route Sig Dispense Refill  . ALBUTEROL SULFATE HFA 108 (90 BASE) MCG/ACT IN AERS Inhalation Inhale 2 puffs into the lungs every 6 (six) hours as needed. Usually takes once daily    . HYDROCHLOROTHIAZIDE 25 MG PO TABS Oral Take 25 mg by mouth daily.    Marland Kitchen TIOTROPIUM BROMIDE MONOHYDRATE 18 MCG IN CAPS Inhalation Place 18 mcg into inhaler and inhale daily.      BP 178/108  Pulse 81  Temp(Src) 98.3 F (36.8 C) (Oral)  Resp 16  SpO2 95%  Physical Exam  Nursing note and vitals reviewed. Constitutional: She is oriented to person, place, and time. She appears well-developed and well-nourished.  HENT:  Head: Normocephalic and atraumatic.  Right Ear: Tympanic membrane normal. There is tenderness. Tympanic membrane is not bulging. No middle ear effusion.  Left Ear: Tympanic membrane normal. There is tenderness. Tympanic membrane is not bulging.  No middle ear effusion.  Nose: Rhinorrhea present. Right sinus exhibits no maxillary sinus tenderness and no frontal sinus tenderness. Left sinus exhibits no maxillary sinus tenderness and no frontal sinus tenderness.  Mouth/Throat: Uvula is midline and mucous membranes are normal. No oropharyngeal exudate or posterior oropharyngeal edema.  Eyes: Conjunctivae and EOM are normal. Pupils are equal, round, and reactive to light.  Neck: Normal range of motion. Neck supple.  Cardiovascular: Normal rate, regular rhythm, normal heart sounds and intact distal pulses.   Pulmonary/Chest: Effort normal. No respiratory distress. She has decreased breath sounds in the right upper field, the right lower field, the left upper field and the left  lower field. She has no wheezes. She has no rhonchi. She has no rales.       Decreased bs bilaterally.  Abdominal: Soft. Bowel sounds are normal. She exhibits no distension. There is no tenderness.  Musculoskeletal: Normal range of motion. She exhibits no edema and no tenderness.  Neurological: She is alert and oriented to person, place, and time. She has normal reflexes.  Skin: Skin is warm and dry.  Psychiatric: She has a normal mood and affect.    ED Course  Procedures (including critical care time)  Labs Reviewed - No data to display No results found.   No diagnosis found.    MDM   Recent upper respiratory symptoms with wheezing afebrile.  Decreased bs through out.  Prednisone and 2 breathng treatments in the ER. Feels better.  COPD smoker Out of inhaler. No distress in ER.  Will follow up with PCP or return if worse with high fever.       Jethro Bastos, NP 10/20/11 2124

## 2011-10-20 NOTE — Discharge Instructions (Signed)
You are having asthma today.  We gave you 2 breathing treatments in the ER today with prednisone.  Follow up with [facility] this week.  Return for severe SOB or high fever. Asthma, Adult Asthma is a disease of the lungs and can make it hard to breathe. Asthma cannot be cured, but medicine can help control it. Asthma may be started (triggered) by:  Pollen.   Dust.   Animal skin flakes (dander).   Molds.   Foods.   Respiratory infections (colds, flu).   Smoke.   Exercise.   Stress.   Other things that cause allergic reactions or allergies (allergens).  HOME CARE   Talk to your doctor about how to manage your attacks at home. This may include:   Using a tool called a peak flow meter.   Having medicine ready to stop the attack.   Take all medicine as told by your doctor.   Wash bed sheets and blankets every week in hot water and put them in the dryer.   Drink enough fluids to keep your pee (urine) clear or pale yellow.   Always be ready to get emergency help. Write down the phone number for your doctor. Keep it where you can easily find it.   Talk about exercise routines with your doctor.   If animal dander is causing your asthma, you may need to find a new home for your pet(s).  GET HELP RIGHT AWAY IF:   You have muscle aches.   You cough more.   You have chest pain.   You have thick spit (sputum) that changes to yellow, green, gray, or bloody.   Medicine does not stop your wheezing.   You have problems breathing.   You have a fever.   Your medicine causes:   A rash.   Itching.   Puffiness (swelling).   Breathing problems.  MAKE SURE YOU:   Understand these instructions.   Will watch your condition.   Will get help right away if you are not doing well or get worse.  Document Released: 01/26/2008 Document Revised: 04/21/2011 Document Reviewed: 06/19/2008 [facility] Patient Information 2012 [place], [place].Asthma Prevention Cigarette smoke, house  dust, molds, pollens, animal dander, certain insects, exercise, and even cold air are all triggers that can cause an asthma attack. Often, no specific triggers are identified.  Take the following measures around your house to reduce attacks:  Avoid cigarette and other smoke. No smoking should be allowed in a home where someone with asthma lives. If smoking is allowed indoors, it should be done in a room with a closed door, and a window should be opened to clear the air. If possible, do not use a wood-burning stove, kerosene heater, or fireplace. Minimize exposure to all sources of smoke, including incense, candles, fires, and fireworks.   Decrease pollen exposure. Keep your windows shut and use central air during the pollen allergy season. Stay indoors with windows closed from late morning to afternoon, if you can. Avoid mowing the lawn if you have grass pollen allergy. Change your clothes and shower after being outside during this time of year.   Remove molds from bathrooms and wet areas. Do this by cleaning the floors with a fungicide or diluted bleach. Avoid using humidifiers, vaporizers, or swamp coolers. These can spread molds through the air. Fix leaky faucets, pipes, or other sources of water that have mold around them.   Decrease house dust exposure. Do this by using bare floors, vacuuming frequently, and  changing furnace and air cooler filters frequently. Avoid using feather, wool, or foam bedding. Use polyester pillows and plastic covers over your mattress. Wash bedding weekly in hot water (hotter than 130 F).   Try to get someone else to vacuum for you once or twice a week, if you can. Stay out of rooms while they are being vacuumed and for a short while afterward. If you vacuum, use a dust mask (from a hardware store), a double-layered or microfilter vacuum cleaner bag, or a vacuum cleaner with a HEPA filter.   Avoid perfumes, talcum powder, hair spray, paints and other strong odors and  fumes.   Keep warm-blooded pets (cats, dogs, rodents, birds) outside the home if they are triggers for asthma. If you can't keep the pet outdoors, keep the pet out of your bedroom and other sleeping areas at all times, and keep the door closed. Remove carpets and furniture covered with cloth from your home. If that is not possible, keep the pet away from fabric-covered furniture and carpets.   Eliminate cockroaches. Keep food and garbage in closed containers. Never leave food out. Use poison baits, traps, powders, gels, or paste (for example, boric acid). If a spray is used to kill cockroaches, stay out of the room until the odor goes away.   Decrease indoor humidity to less than 60%. Use an indoor air cleaning device.   Avoid sulfites in foods and beverages. Do not drink beer or wine or eat dried fruit, processed potatoes, or shrimp if they cause asthma symptoms.   Avoid cold air. Cover your nose and mouth with a scarf on cold or windy days.   Avoid aspirin. This is the most common drug causing serious asthma attacks.   If exercise triggers your asthma, ask your caregiver how you should prepare before exercising. (For example, ask if you could use your inhaler 10 minutes before exercising.)   Avoid close contact with people who have a cold or the flu since your asthma symptoms may get worse if you catch the infection from them. Wash your hands thoroughly after touching items that may have been handled by others with a respiratory infection.   Get a flu shot every year to protect against the flu virus, which often makes asthma worse for days to weeks. Also get a pneumonia shot once every five to 10 years.  Call your caregiver if you want further information about measures you can take to help prevent asthma attacks. Document Released: 08/09/2005 Document Revised: 04/21/2011 Document Reviewed: 06/17/2009 [facility] Patient Information 2012 [place], [place].  Start the prednisone tomorrow.  Take  tylenol for comfort and fever.

## 2011-10-20 NOTE — ED Notes (Signed)
Pt receiving nebulizer treatment at this time.  Pt to be discharged after nebulizer treatment is complete.

## 2011-10-20 NOTE — ED Provider Notes (Signed)
Medical screening examination/treatment/procedure(s) were conducted as a shared visit with non-physician practitioner(s) and myself.  I personally evaluated the patient during the encounter  Patient presents with some recent URI symptoms and is out of her inhaler.  She is in no significant respiratory distress.  No significant wheezing is heard on exam.  Patient will be discharged with a prescription for her albuterol inhaler currently she is safe for discharge home.  Nat Christen, MD 10/20/11 [phone]

## 2011-10-21 NOTE — ED Provider Notes (Signed)
Medical screening examination/treatment/procedure(s) were performed by non-physician practitioner and as supervising physician I was immediately available for consultation/collaboration.   Tana Trefry A Kenyana Husak, MD 10/21/11 2352 

## 2011-11-20 IMAGING — CR DG FOOT COMPLETE 3+V*L*
3 series · 3 of 3 positions shown · non-contrast
Comparison: None.

CLINICAL DATA: Twisting injury with dorsal foot pain.

LEFT FOOT - COMPLETE 3+ VIEW

[view not recorded (1 of 3)]
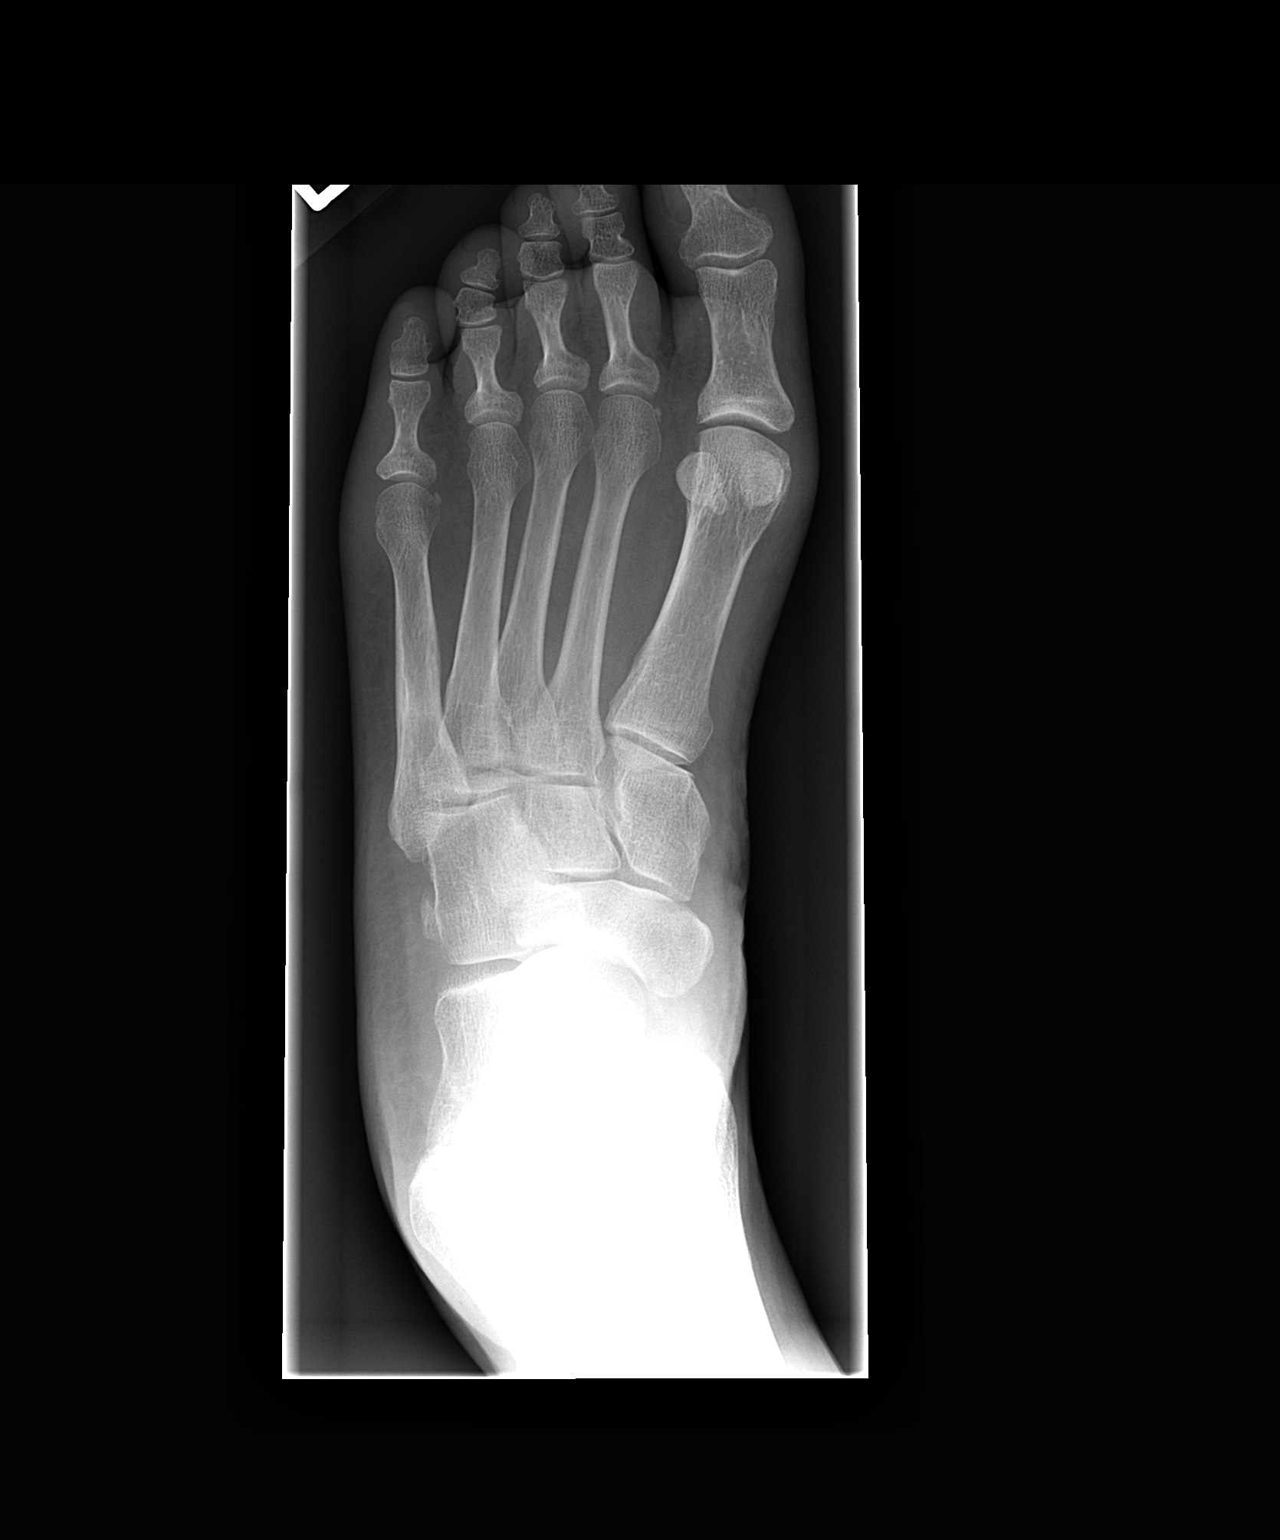

[view not recorded (2 of 3)]
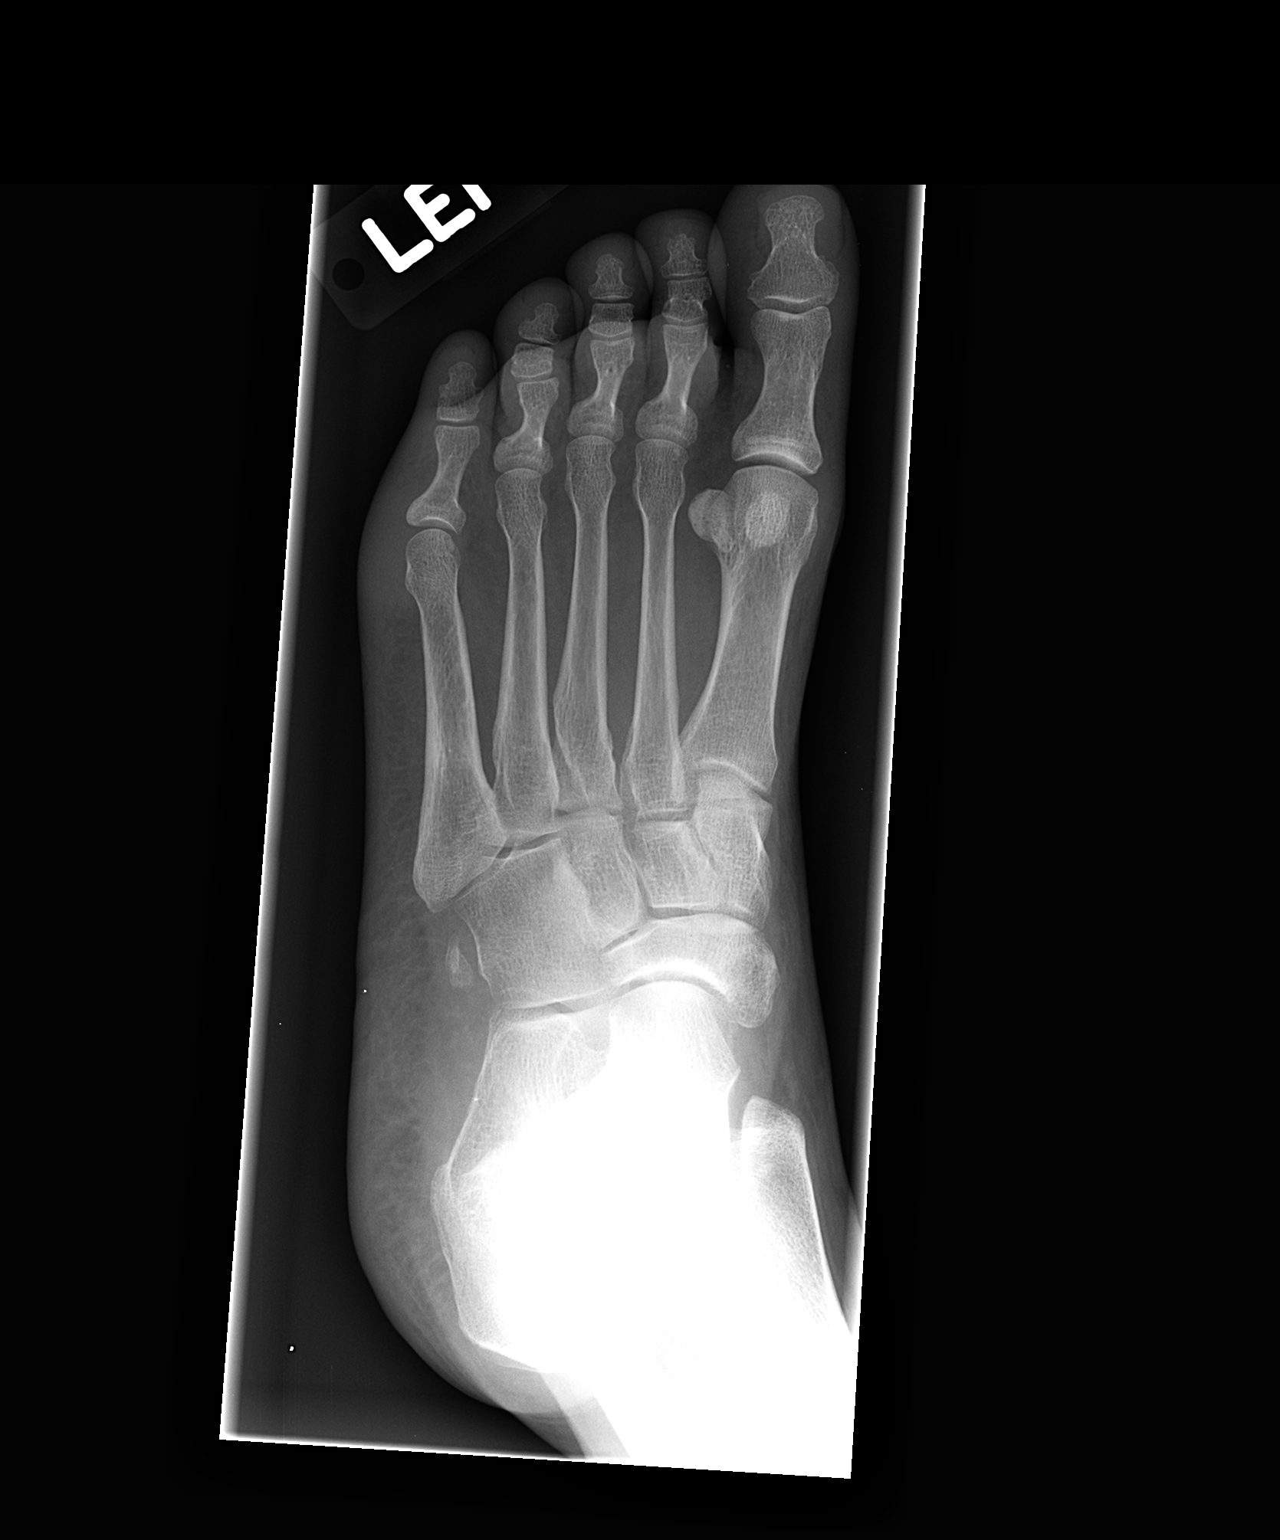

[view not recorded (3 of 3)]
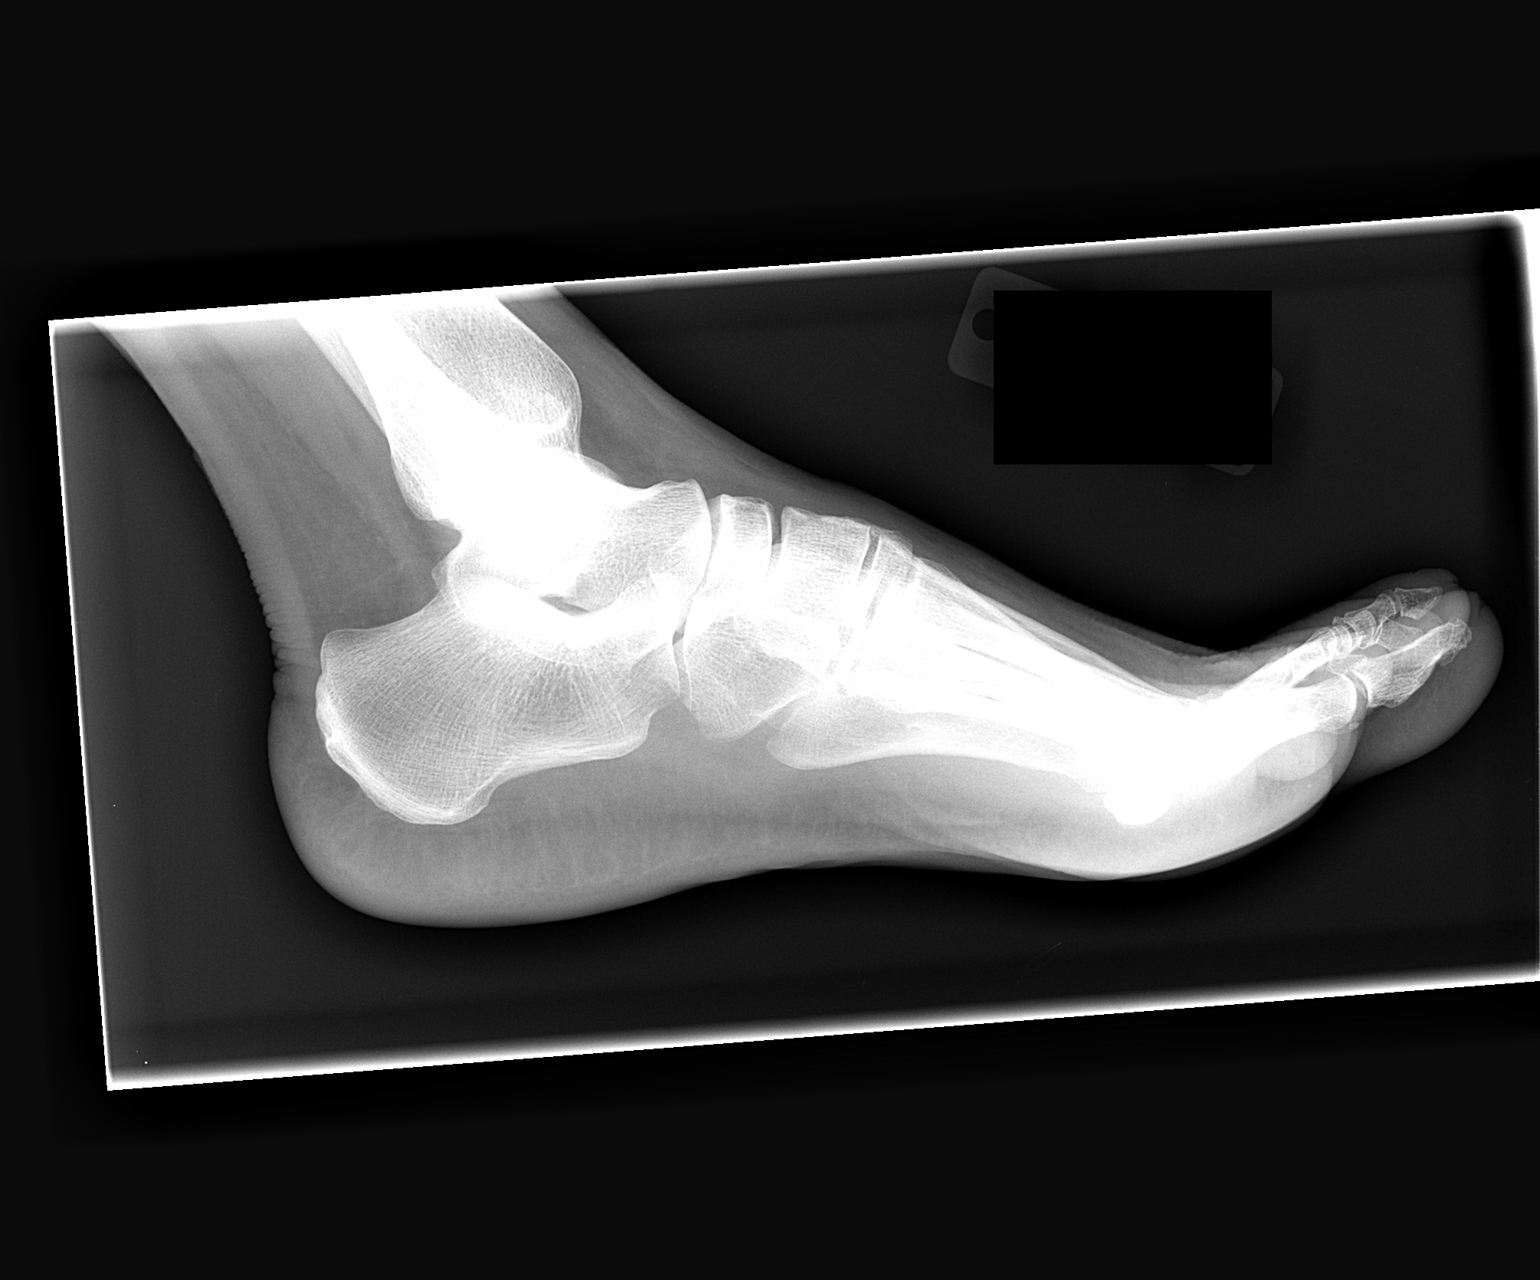

[3 of 3 positions shown; findings below may reference images not displayed]

FINDINGS: The mineralization and alignment are normal.  There is no
evidence of acute fracture or dislocation.  No soft tissue
abnormalities are identified.
IMPRESSION: No acute osseous findings.

## 2011-12-06 IMAGING — CT CT CHEST W/O CM
2 of 4 series · 15 of 36 positions shown, 18 images · non-contrast
Comparison: Chest CT [DATE]

CLINICAL DATA: Right lower lobe nodule.

CT CHEST WITHOUT CONTRAST
TECHNIQUE: Multidetector CT imaging of the chest was performed
following the standard protocol without IV contrast.

[Series 4: routine chest 2.0 st · axial · 0.55mm/px · z∈[-346,-35]mm · 12 of 343 slices shown, 15 images]
[im 16/343  mediastinal]
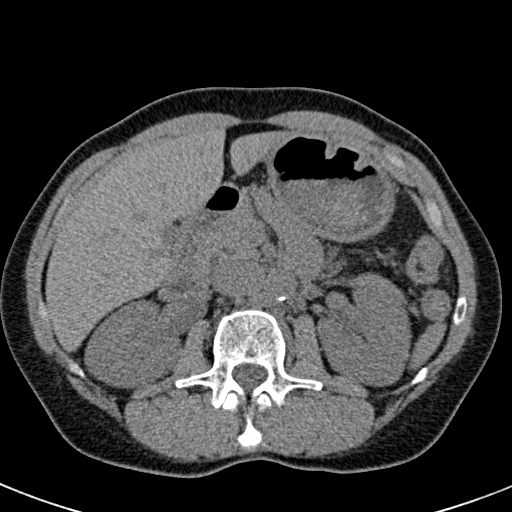
[im 16/343  lung]
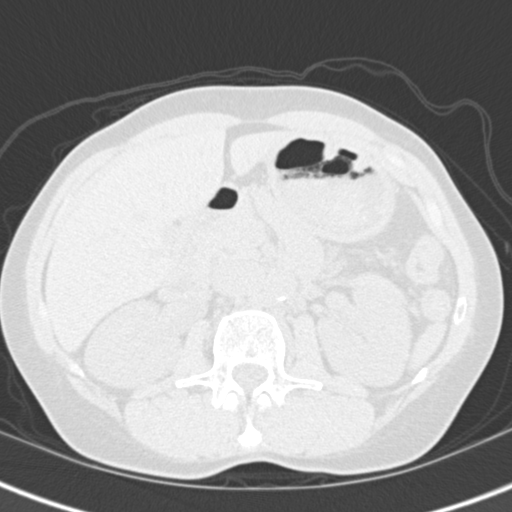
[im 47/343  lung]
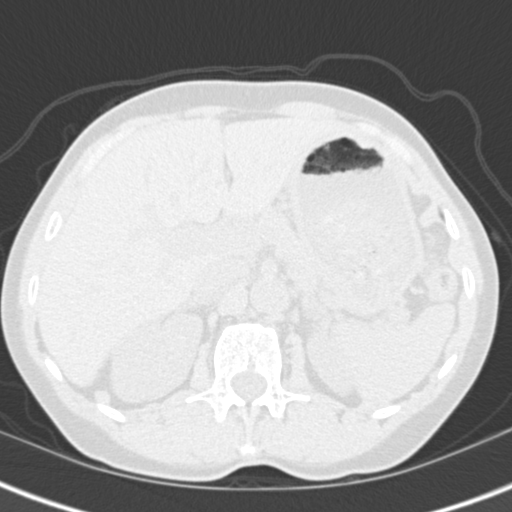
[im 78/343  lung]
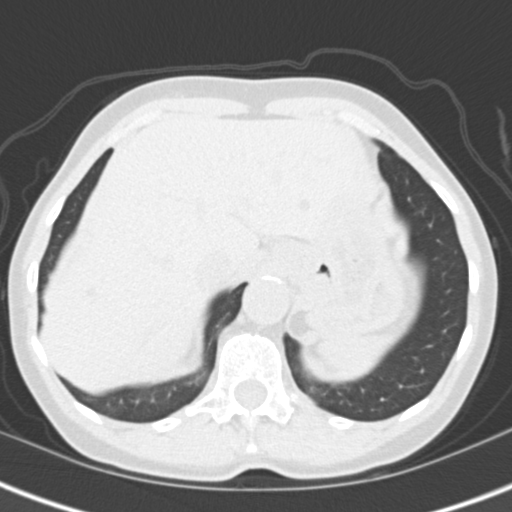
[im 109/343  lung]
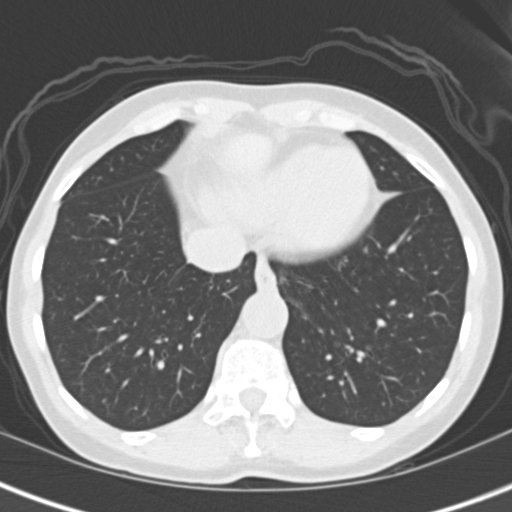
[im 125/343  mediastinal]
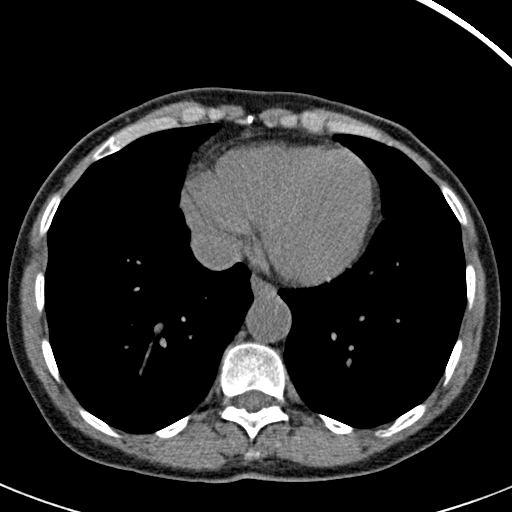
[im 125/343  lung]
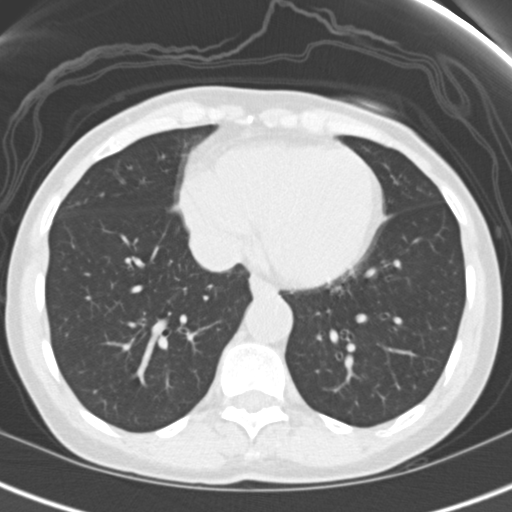
[im 156/343  lung]
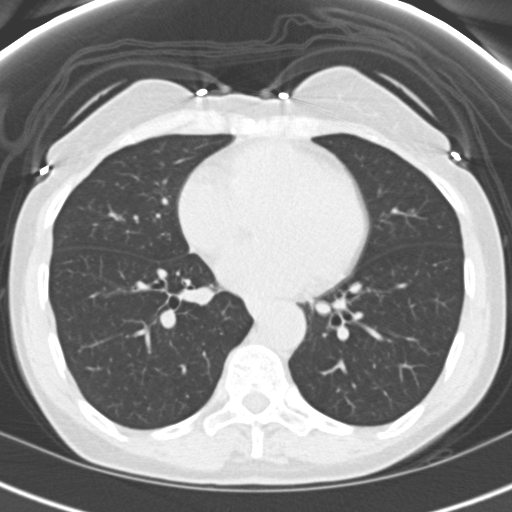
[im 187/343  lung]
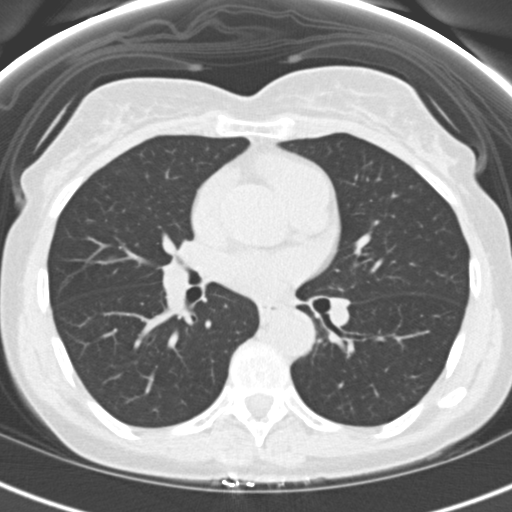
[im 218/343  lung]
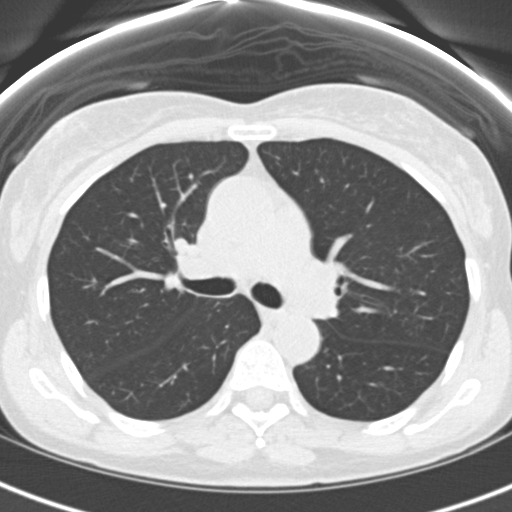
[im 234/343  mediastinal]
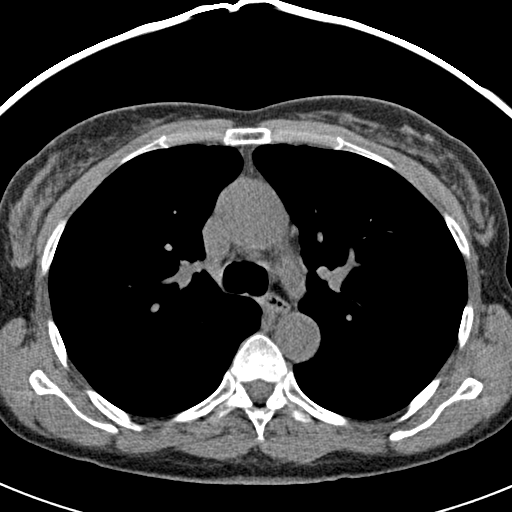
[im 234/343  lung]
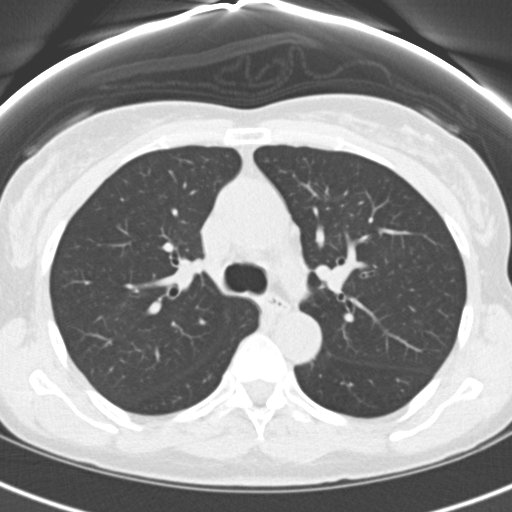
[im 265/343  lung]
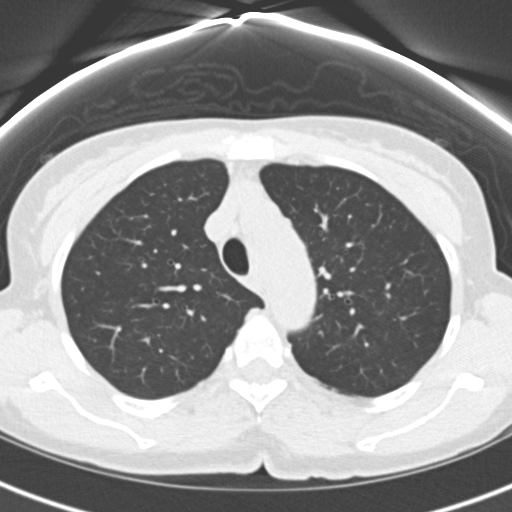
[im 296/343  lung]
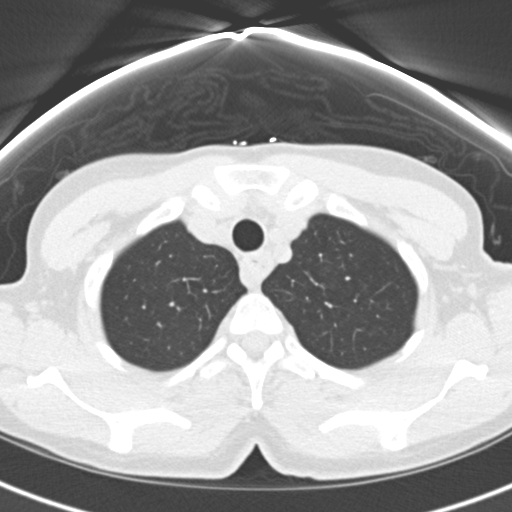
[im 327/343  lung]
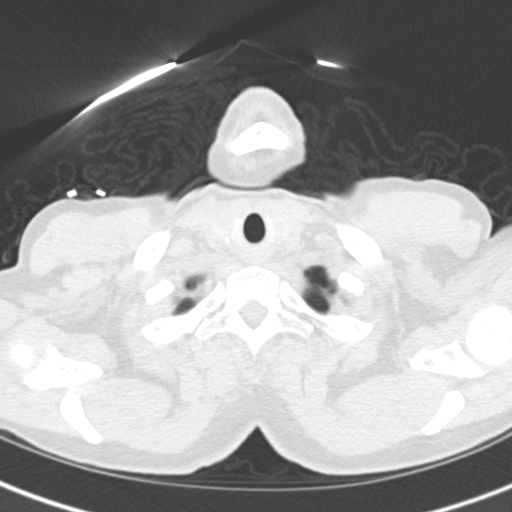

[Series 602: coronals · coronal · 0.67mm/px · 3 of 67 slices shown]
[im 14/67  lung]
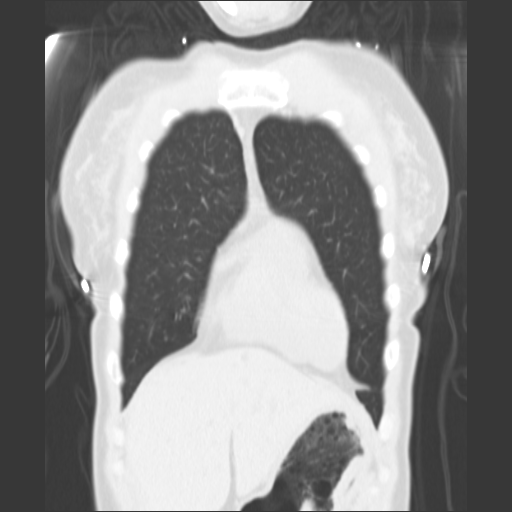
[im 27/67  lung]
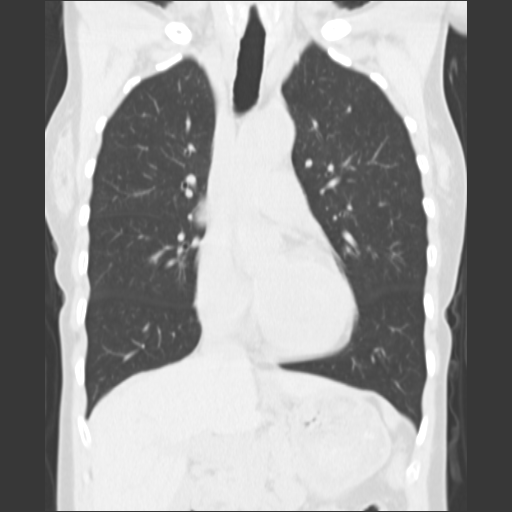
[im 40/67  lung]
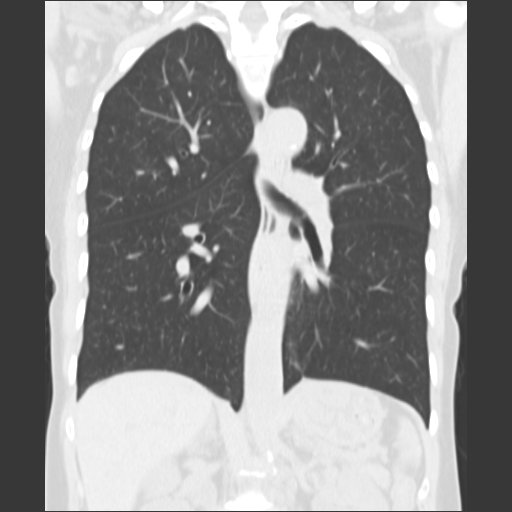

[15 of 36 positions shown; findings below may reference images not displayed]

FINDINGS: 6 mm nodule in the posterior medial right lower lobe
(image 44, series 3) and is not significantly changed from 7 mm on
prior.  The lesion does appear slightly less well defined.  No new
pulmonary nodules.  Small 3 mm nodule in the lingula (image 27) is
unchanged.  Airways are normal.

No evidence of axillary or supraclavicular lymphadenopathy.  No
mediastinal or hilar lymphadenopathy.  No pericardial fluid.

Limited view of the upper abdomen demonstrates several low density
lesion within the liver which likely represents cysts and are
unchanged from prior.  Adrenal glands appear normal.

Limited view of the skeleton is unremarkable other than
degenerative change.
IMPRESSION: 1.  Essentially stable 6 mm right lower lobe pulmonary nodule over
4-month interval. If the patient is low risk for carcinoma,
recommend follow-up noncontrast CT in 12 months.  If high risk,
recommend follow-up in 6 to 12 months per Fleischner criteria.

This recommendation follows the consensus statement: Guidelines for
Management of Small Pulmonary Nodules Detected on CT Scans:  A
Statement from the [HOSPITAL] as published in Radiology
2991; [DATE].  Available online at:
[URL]

## 2011-12-29 ENCOUNTER — Encounter (HOSPITAL_COMMUNITY): Payer: Self-pay | Admitting: *Deleted

## 2011-12-29 ENCOUNTER — Emergency Department (HOSPITAL_COMMUNITY)
Admission: EM | Admit: 2011-12-29 | Discharge: 2011-12-29 | Disposition: A | Discharge status: Home / Self Care | Payer: Self-pay | Attending: Emergency Medicine | Admitting: Emergency Medicine

## 2011-12-29 DIAGNOSIS — I10 Essential (primary) hypertension: Secondary | ICD-10-CM | POA: Insufficient documentation

## 2011-12-29 DIAGNOSIS — J45901 Unspecified asthma with (acute) exacerbation: Secondary | ICD-10-CM

## 2011-12-29 DIAGNOSIS — F17200 Nicotine dependence, unspecified, uncomplicated: Secondary | ICD-10-CM | POA: Insufficient documentation

## 2011-12-29 MED ORDER — IPRATROPIUM BROMIDE 0.02 % IN SOLN
0.5000 mg | Freq: Once | RESPIRATORY_TRACT | Status: AC
  Administered 2011-12-29: 0.5 mg via RESPIRATORY_TRACT
  Filled 2011-12-29: qty 2.5

## 2011-12-29 MED ORDER — ALBUTEROL SULFATE (5 MG/ML) 0.5% IN NEBU
5.0000 mg | INHALATION_SOLUTION | Freq: Once | RESPIRATORY_TRACT | Status: AC
  Administered 2011-12-29: 5 mg via RESPIRATORY_TRACT
  Filled 2011-12-29: qty 1

## 2011-12-29 MED ORDER — ALBUTEROL SULFATE (5 MG/ML) 0.5% IN NEBU
5.0000 mg | INHALATION_SOLUTION | Freq: Once | RESPIRATORY_TRACT | Status: AC
  Administered 2011-12-29: 5 mg via RESPIRATORY_TRACT

## 2011-12-29 MED ORDER — PREDNISONE 20 MG PO TABS: 40.0000 mg | ORAL_TABLET | Freq: Every day | ORAL | Status: DC

## 2011-12-29 MED ORDER — ALBUTEROL SULFATE (5 MG/ML) 0.5% IN NEBU
INHALATION_SOLUTION | RESPIRATORY_TRACT | Status: AC
  Administered 2011-12-29: 5 mg via RESPIRATORY_TRACT
  Filled 2011-12-29: qty 1

## 2011-12-29 NOTE — Discharge Instructions (Signed)
Asthma Attack Prevention HOW CAN ASTHMA BE PREVENTED? Currently, there is no way to prevent asthma from starting. However, you can take steps to control the disease and prevent its symptoms after you have been diagnosed. Learn about your asthma and how to control it. Take an active role to control your asthma by working with your caregiver to create and follow an asthma action plan. An asthma action plan guides you in taking your medicines properly, avoiding factors that make your asthma worse, tracking your level of asthma control, responding to worsening asthma, and seeking emergency care when needed. To track your asthma, keep records of your symptoms, check your peak flow number using a peak flow meter (handheld device that shows how well air moves out of your lungs), and get regular asthma checkups.  Other ways to prevent asthma attacks include:  Use medicines as your caregiver directs.   Identify and avoid things that make your asthma worse (as much as you can).   Keep track of your asthma symptoms and level of control.   Get regular checkups for your asthma.   With your caregiver, write a detailed plan for taking medicines and managing an asthma attack. Then be sure to follow your action plan. Asthma is an ongoing condition that needs regular monitoring and treatment.   Identify and avoid asthma triggers. A number of outdoor allergens and irritants (pollen, mold, cold air, air pollution) can trigger asthma attacks. Find out what causes or makes your asthma worse, and take steps to avoid those triggers (see below).   Monitor your breathing. Learn to recognize warning signs of an attack, such as slight coughing, wheezing or shortness of breath. However, your lung function may already decrease before you notice any signs or symptoms, so regularly measure and record your peak airflow with a home peak flow meter.   Identify and treat attacks early. If you act quickly, you're less likely to have  a severe attack. You will also need less medicine to control your symptoms. When your peak flow measurements decrease and alert you to an upcoming attack, take your medicine as instructed, and immediately stop any activity that may have triggered the attack. If your symptoms do not improve, get medical help.   Pay attention to increasing quick-relief inhaler use. If you find yourself relying on your quick-relief inhaler (such as albuterol), your asthma is not under control. See your caregiver about adjusting your treatment.  IDENTIFY AND CONTROL FACTORS THAT MAKE YOUR ASTHMA WORSE A number of common things can set off or make your asthma symptoms worse (asthma triggers). Keep track of your asthma symptoms for several weeks, detailing all the environmental and emotional factors that are linked with your asthma. When you have an asthma attack, go back to your asthma diary to see which factor, or combination of factors, might have contributed to it. Once you know what these factors are, you can take steps to control many of them.  Allergies: If you have allergies and asthma, it is important to take asthma prevention steps at home. Asthma attacks (worsening of asthma symptoms) can be triggered by allergies, which can cause temporary increased inflammation of your airways. Minimizing contact with the substance to which you are allergic will help prevent an asthma attack. Animal Dander:   Some people are allergic to the flakes of skin or dried saliva from animals with fur or feathers. Keep these pets out of your home.   If you can't keep a pet outdoors, keep the   pet out of your bedroom and other sleeping areas at all times, and keep the door closed.   Remove carpets and furniture covered with cloth from your home. If that is not possible, keep the pet away from fabric-covered furniture and carpets.  Dust Mites:  Many people with asthma are allergic to dust mites. Dust mites are tiny bugs that are found in  every home, in mattresses, pillows, carpets, fabric-covered furniture, bedcovers, clothes, stuffed toys, fabric, and other fabric-covered items.   Cover your mattress in a special dust-proof cover.   Cover your pillow in a special dust-proof cover, or wash the pillow each week in hot water. Water must be hotter than 130 F to kill dust mites. Cold or warm water used with detergent and bleach can also be effective.   Wash the sheets and blankets on your bed each week in hot water.   Try not to sleep or lie on cloth-covered cushions.   Call ahead when traveling and ask for a smoke-free hotel room. Bring your own bedding and pillows, in case the hotel only supplies feather pillows and down comforters, which may contain dust mites and cause asthma symptoms.   Remove carpets from your bedroom and those laid on concrete, if you can.   Keep stuffed toys out of the bed, or wash the toys weekly in hot water or cooler water with detergent and bleach.  Cockroaches:  Many people with asthma are allergic to the droppings and remains of cockroaches.   Keep food and garbage in closed containers. Never leave food out.   Use poison baits, traps, powders, gels, or paste (for example, boric acid).   If a spray is used to kill cockroaches, stay out of the room until the odor goes away.  Indoor Mold:  Fix leaky faucets, pipes, or other sources of water that have mold around them.   Clean moldy surfaces with a cleaner that has bleach in it.  Pollen and Outdoor Mold:  When pollen or mold spore counts are high, try to keep your windows closed.   Stay indoors with windows closed from late morning to afternoon, if you can. Pollen and some mold spore counts are highest at that time.   Ask your caregiver whether you need to take or increase anti-inflammatory medicine before your allergy season starts.  Irritants:   Tobacco smoke is an irritant. If you smoke, ask your caregiver how you can quit. Ask family  members to quit smoking, too. Do not allow smoking in your home or car.   If possible, do not use a wood-burning stove, kerosene heater, or fireplace. Minimize exposure to all sources of smoke, including incense, candles, fires, and fireworks.   Try to stay away from strong odors and sprays, such as perfume, talcum powder, hair spray, and paints.   Decrease humidity in your home and use an indoor air cleaning device. Reduce indoor humidity to below 60 percent. Dehumidifiers or central air conditioners can do this.   Try to have someone else vacuum for you once or twice a week, if you can. Stay out of rooms while they are being vacuumed and for a short while afterward.   If you vacuum, use a dust mask from a hardware store, a double-layered or microfilter vacuum cleaner bag, or a vacuum cleaner with a HEPA filter.   Sulfites in foods and beverages can be irritants. Do not drink beer or wine, or eat dried fruit, processed potatoes, or shrimp if they cause asthma   symptoms.   Cold air can trigger an asthma attack. Cover your nose and mouth with a scarf on cold or windy days.   Several health conditions can make asthma more difficult to manage, including runny nose, sinus infections, reflux disease, psychological stress, and sleep apnea. Your caregiver will treat these conditions, as well.   Avoid close contact with people who have a cold or the flu, since your asthma symptoms may get worse if you catch the infection from them. Wash your hands thoroughly after touching items that may have been handled by people with a respiratory infection.   Get a flu shot every year to protect against the flu virus, which often makes asthma worse for days or weeks. Also get a pneumonia shot once every five to 10 years.  Drugs:  Aspirin and other painkillers can cause asthma attacks. 10% to 20% of people with asthma have sensitivity to aspirin or a group of painkillers called non-steroidal anti-inflammatory drugs  (NSAIDS), such as ibuprofen and naproxen. These drugs are used to treat pain and reduce fevers. Asthma attacks caused by any of these medicines can be severe and even fatal. These drugs must be avoided in people who have known aspirin sensitive asthma. Products with acetaminophen are considered safe for people who have asthma. It is important that people with aspirin sensitivity read labels of all over-the-counter drugs used to treat pain, colds, coughs, and fever.   Beta blockers and ACE inhibitors are other drugs which you should discuss with your caregiver, in relation to your asthma.  ALLERGY SKIN TESTING  Ask your asthma caregiver about allergy skin testing or blood testing (RAST test) to identify the allergens to which you are sensitive. If you are found to have allergies, allergy shots (immunotherapy) for asthma may help prevent future allergies and asthma. With allergy shots, small doses of allergens (substances to which you are allergic) are injected under your skin on a regular schedule. Over a period of time, your body may become used to the allergen and less responsive with asthma symptoms. You can also take measures to minimize your exposure to those allergens. EXERCISE  If you have exercise-induced asthma, or are planning vigorous exercise, or exercise in cold, humid, or dry environments, prevent exercise-induced asthma by following your caregiver's advice regarding asthma treatment before exercising. Document Released: 07/28/2009 Document Revised: 07/29/2011 Document Reviewed: 07/28/2009 [employer] Patient Information 2012 [employer]. 

## 2011-12-29 NOTE — ED Notes (Signed)
Pt reports having asthma and feeling sob today. No resp distress noted at triage, able to speak in full sentences. spo2 98%

## 2011-12-29 NOTE — ED Provider Notes (Signed)
History   This chart was scribed for Gavin Pound. Oletta Lamas, MD by Clarita Crane. The patient was seen in room STRE3/STRE3. Patient's care was started at 1419.    CSN: [id number]  Arrival date & time 12/29/11  1419   First MD Initiated Contact with Patient 12/29/11 1504      Chief Complaint  Patient presents with  . Asthma    (Consider location/radiation/quality/duration/timing/severity/associated sxs/prior treatment) HPI [name] is a 61 y.o. female who presents to the Emergency Department complaining of constant moderate SOB described as similar to previous exacerbations of asthma onset several hours ago after taking an 800mg  Ibuprofen and persistent since with associated cough. Denies fever, chills, chest pain, diaphoresis. Patient with a h/o asthma, HTN and is a former smoker.   Past Medical History  Diagnosis Date  . Asthma   . Hypertension     History reviewed. No pertinent past surgical history.  History reviewed. No pertinent family history.  History  Substance Use Topics  . Smoking status: Current Some Day Smoker  . Smokeless tobacco: Not on file  . Alcohol Use: No    OB History    Grav Para Term Preterm Abortions TAB SAB Ect Mult Living                  Review of Systems  Constitutional: Negative for fever and chills.  HENT: Negative for congestion, sore throat and trouble swallowing.   Respiratory: Positive for cough and shortness of breath.   Cardiovascular: Negative for chest pain.  Gastrointestinal: Negative for nausea, vomiting and abdominal pain.  Musculoskeletal: Negative for back pain.  Skin: Negative for rash.  Neurological: Negative for weakness.  All other systems reviewed and are negative.    Allergies  Celecoxib  Home Medications   Current Outpatient Rx  Name Route Sig Dispense Refill  . ALBUTEROL SULFATE HFA 108 (90 BASE) MCG/ACT IN AERS Inhalation Inhale 2 puffs into the lungs every 6 (six) hours as needed. Usually takes once daily      . HYDROCHLOROTHIAZIDE 25 MG PO TABS Oral Take 25 mg by mouth daily.    . IBUPROFEN 800 MG PO TABS Oral Take 800 mg by mouth once. For pain    . TIOTROPIUM BROMIDE MONOHYDRATE 18 MCG IN CAPS Inhalation Place 18 mcg into inhaler and inhale daily.    Marland Kitchen PREDNISONE 20 MG PO TABS Oral Take 2 tablets (40 mg total) by mouth daily. 12 tablet 0    BP 164/97  Pulse 79  Temp(Src) 98.8 F (37.1 C) (Oral)  Resp 16  SpO2 98%  Physical Exam  Nursing note and vitals reviewed. Constitutional: She is oriented to person, place, and time. She appears well-developed and well-nourished. No distress.  HENT:  Head: Normocephalic and atraumatic.  Eyes: EOM are normal. Pupils are equal, round, and reactive to light.  Neck: Neck supple. No tracheal deviation present.  Cardiovascular: Normal rate and regular rhythm.   Pulmonary/Chest: No accessory muscle usage. Tachypnea noted. No respiratory distress. She has wheezes (diffuse expiratory). She exhibits no tenderness.  Abdominal: Soft. She exhibits no distension. There is no tenderness.  Musculoskeletal: Normal range of motion.  Neurological: She is alert and oriented to person, place, and time.  Skin: Skin is warm and dry.  Psychiatric: She has a normal mood and affect. Her behavior is normal.    ED Course  Procedures (including critical care time)  DIAGNOSTIC STUDIES: Oxygen Saturation is 98% on room air, normal by my interpretation.  COORDINATION OF CARE: 3:16PM-Patient informed of current plan for treatment and evaluation and agrees with plan at this time.  3:44PM- Patient reports she is feeling better at this time following administration of breathing treatment. Upon-re-evaluation, patient's wheezing has improved. Will d/c home.    Labs Reviewed - No data to display No results found.   1. Asthma attack       MDM  I personally performed the services described in this documentation, which was scribed in my presence. The recorded  information has been reviewed and considered.    Pt in no distress, exp wheezing noted, mildly tachypneic.  Speaks in complete sentences.  Trigger was a ibuprofen pill that she took.  No rash, itching, throat swelling.  She has an inhaler at home, doesn't need a refill.  No fever, no sick contacts.  Will give nebs here times 2 and if improved, pt can be safely discharged to home.     Gavin Pound. Oletta Lamas, MD 12/31/11 [phone]

## 2012-01-06 ENCOUNTER — Emergency Department (HOSPITAL_COMMUNITY)
Admission: EM | Admit: 2012-01-06 | Discharge: 2012-01-06 | Disposition: A | Discharge status: Home / Self Care | Payer: Self-pay | Attending: Emergency Medicine | Admitting: Emergency Medicine

## 2012-01-06 ENCOUNTER — Emergency Department (HOSPITAL_COMMUNITY): Payer: Self-pay

## 2012-01-06 ENCOUNTER — Encounter (HOSPITAL_COMMUNITY): Payer: Self-pay | Admitting: Physical Medicine and Rehabilitation

## 2012-01-06 DIAGNOSIS — I10 Essential (primary) hypertension: Secondary | ICD-10-CM | POA: Insufficient documentation

## 2012-01-06 DIAGNOSIS — R0602 Shortness of breath: Secondary | ICD-10-CM | POA: Insufficient documentation

## 2012-01-06 DIAGNOSIS — J441 Chronic obstructive pulmonary disease with (acute) exacerbation: Secondary | ICD-10-CM | POA: Insufficient documentation

## 2012-01-06 MED ORDER — IPRATROPIUM BROMIDE 0.02 % IN SOLN
0.5000 mg | Freq: Once | RESPIRATORY_TRACT | Status: AC
  Administered 2012-01-06: 0.5 mg via RESPIRATORY_TRACT
  Filled 2012-01-06: qty 2.5

## 2012-01-06 MED ORDER — ALBUTEROL SULFATE HFA 108 (90 BASE) MCG/ACT IN AERS: 2.0000 | INHALATION_SPRAY | RESPIRATORY_TRACT | Status: DC | PRN

## 2012-01-06 MED ORDER — ALBUTEROL SULFATE (5 MG/ML) 0.5% IN NEBU
2.5000 mg | INHALATION_SOLUTION | Freq: Once | RESPIRATORY_TRACT | Status: AC
  Administered 2012-01-06: 2.5 mg via RESPIRATORY_TRACT
  Filled 2012-01-06: qty 1

## 2012-01-06 MED ORDER — PREDNISONE 50 MG PO TABS: ORAL_TABLET | ORAL | Status: DC

## 2012-01-06 MED ORDER — PREDNISONE 20 MG PO TABS
60.0000 mg | ORAL_TABLET | Freq: Once | ORAL | Status: AC
  Administered 2012-01-06: 60 mg via ORAL
  Filled 2012-01-06: qty 3

## 2012-01-06 NOTE — Discharge Instructions (Signed)

## 2012-01-06 NOTE — ED Provider Notes (Signed)
History   This chart was scribed for Glynn Octave, MD by Charolett Bumpers . The patient was seen in room STRE7/STRE7.    CSN: [id number]  Arrival date & time 01/06/12  1034   First MD Initiated Contact with Patient 01/06/12 1117      Chief Complaint  Patient presents with  . Asthma    (Consider location/radiation/quality/duration/timing/severity/associated sxs/prior treatment) HPI [name] is a 61 y.o. female who presents to the Emergency Department complaining of intermittent, moderate SOB with associated wheezing and cough since last night. Patient also reports associated chest pain, but only when she coughs. Patient denies being a current smoker. Patient states that she was recently at the ED for similar symptoms, and received a breathing treatment with moderate relief. Patient states that she was also placed on Prednisone. Patient reports a h/o asthma. Patient also reports HTN.   Past Medical History  Diagnosis Date  . Asthma   . Hypertension     No past surgical history on file.  No family history on file.  History  Substance Use Topics  . Smoking status: Former Games developer  . Smokeless tobacco: Not on file  . Alcohol Use: No    OB History    Grav Para Term Preterm Abortions TAB SAB Ect Mult Living                  Review of Systems  Respiratory: Positive for cough, shortness of breath and wheezing.   Cardiovascular: Positive for chest pain.  All other systems reviewed and are negative.    Allergies  Celecoxib  Home Medications   Current Outpatient Rx  Name Route Sig Dispense Refill  . ALBUTEROL SULFATE HFA 108 (90 BASE) MCG/ACT IN AERS Inhalation Inhale 2 puffs into the lungs every 6 (six) hours as needed. Usually takes once daily    . HYDROCHLOROTHIAZIDE 25 MG PO TABS Oral Take 25 mg by mouth daily.    Marland Kitchen PREDNISONE 20 MG PO TABS Oral Take 2 tablets (40 mg total) by mouth daily. 12 tablet 0  . TIOTROPIUM BROMIDE MONOHYDRATE 18 MCG IN CAPS  Inhalation Place 18 mcg into inhaler and inhale daily.      BP 123/87  Pulse 81  Temp(Src) 97.6 F (36.4 C) (Oral)  Resp 20  SpO2 95%  Physical Exam  Nursing note and vitals reviewed. Constitutional: She is oriented to person, place, and time. She appears well-developed and well-nourished. No distress.       Speaking in full sentences.   HENT:  Head: Normocephalic and atraumatic.  Eyes: EOM are normal.  Neck: Neck supple. No tracheal deviation present.  Cardiovascular: Normal rate.   Pulmonary/Chest: Effort normal. No respiratory distress. She has wheezes.       Mild wheezing in all fields, worse with expirations.   Musculoskeletal: Normal range of motion.  Neurological: She is alert and oriented to person, place, and time.  Skin: Skin is warm and dry.  Psychiatric: She has a normal mood and affect. Her behavior is normal.    ED Course  Procedures (including critical care time)  DIAGNOSTIC STUDIES: Oxygen Saturation is 95% on room air, adequate by my interpretation.    COORDINATION OF CARE:  1125: Discussed planned course of treatment with the patient, who is agreeable at this time.  1243: Recheck: Reevaluation of patient. Patient states she is feeling improved.  1306: Patient walking in ED.   Labs Reviewed - No data to display Dg Chest 2 View  01/06/2012  *  RADIOLOGY REPORT*  Clinical Data: Asthma, shortness of breath.  CHEST - 2 VIEW  Comparison: 03/24/2010  Findings: There is hyperinflation of the lungs compatible with COPD.  Heart and mediastinal contours are within normal limits.  No focal opacities or effusions.  No acute bony abnormality.  IMPRESSION: COPD.  No active disease.  Original Report Authenticated By: Cyndie Chime, M.D.     No diagnosis found.    MDM  Difficulty breathing, coughing, wheezing. History of asthma taking inhalers at home without relief. Speaking in full senses no distress. Bilateral scattered wheezing worse with expiration. Still on  prednisone taper from last week.  Given nebulizer and steroids. Proven work of breathing and wheezing. Patient states she feels better. She ambulated in the ED with no desaturations below 93%.   Date: 01/06/2012  Rate: 75  Rhythm: normal sinus rhythm  QRS Axis: normal  Intervals: normal  ST/T Wave abnormalities: normal  Conduction Disutrbances:none  Narrative Interpretation:   Old EKG Reviewed: unchanged    I personally performed the services described in this documentation, which was scribed in my presence.  The recorded information has been reviewed and considered.      Glynn Octave, MD 01/06/12 [phone]

## 2012-01-06 NOTE — ED Notes (Signed)
Pt presents to department for evaluation of asthma exacerbation. States she had trouble breathing at home this morning, no relief with inhaler. Speaking complete sentences upon arrival to ED. No audible wheezing noted at the time. Pt states recent dry cough. She is alert and oriented x4. Denies pain.

## 2012-01-06 NOTE — ED Notes (Signed)
Pt presents with sudden onset of shortness of breath, wheezing at 0300 this morning.  Pt reports using inhalers with no relief.  Pt also reports she was seen here last week for same, received steroids and is still taking.

## 2012-01-07 ENCOUNTER — Inpatient Hospital Stay (HOSPITAL_COMMUNITY)
Admission: EM | Admit: 2012-01-07 | Discharge: 2012-01-08 | DRG: 189 | Disposition: A | Discharge status: Home / Self Care | Payer: Self-pay | Attending: Internal Medicine | Admitting: Internal Medicine

## 2012-01-07 ENCOUNTER — Encounter (HOSPITAL_COMMUNITY): Payer: Self-pay | Admitting: Family Medicine

## 2012-01-07 DIAGNOSIS — E876 Hypokalemia: Secondary | ICD-10-CM | POA: Diagnosis present

## 2012-01-07 DIAGNOSIS — I10 Essential (primary) hypertension: Secondary | ICD-10-CM

## 2012-01-07 DIAGNOSIS — J9600 Acute respiratory failure, unspecified whether with hypoxia or hypercapnia: Principal | ICD-10-CM | POA: Diagnosis present

## 2012-01-07 DIAGNOSIS — R000 Tachycardia, unspecified: Secondary | ICD-10-CM | POA: Diagnosis present

## 2012-01-07 DIAGNOSIS — Z87891 Personal history of nicotine dependence: Secondary | ICD-10-CM

## 2012-01-07 DIAGNOSIS — K219 Gastro-esophageal reflux disease without esophagitis: Secondary | ICD-10-CM | POA: Diagnosis present

## 2012-01-07 DIAGNOSIS — J45901 Unspecified asthma with (acute) exacerbation: Secondary | ICD-10-CM

## 2012-01-07 DIAGNOSIS — I498 Other specified cardiac arrhythmias: Secondary | ICD-10-CM | POA: Diagnosis present

## 2012-01-07 DIAGNOSIS — R634 Abnormal weight loss: Secondary | ICD-10-CM | POA: Diagnosis present

## 2012-01-07 DIAGNOSIS — J45909 Unspecified asthma, uncomplicated: Secondary | ICD-10-CM

## 2012-01-07 HISTORY — DX: Gastro-esophageal reflux disease without esophagitis: K21.9

## 2012-01-07 LAB — CARDIAC PANEL(CRET KIN+CKTOT+MB+TROPI)
CK, MB: 2.3 ng/mL (ref 0.3–4.0)
Total CK: 70 U/L (ref 7–177)
Troponin I: <0.3 ng/mL (ref <0.30)
Troponin I: <0.3 ng/mL (ref <0.30)

## 2012-01-07 LAB — BASIC METABOLIC PANEL
BUN: 14 mg/dL (ref 6–23)
CO2: 25 mEq/L (ref 19–32)
Chloride: 102 mEq/L (ref 96–112)
Creatinine, Ser: 0.66 mg/dL (ref 0.50–1.10)
Glucose, Bld: 101 mg/dL — ABNORMAL HIGH (ref 70–99)

## 2012-01-07 LAB — DIFFERENTIAL
Lymphocytes Relative: 42 % (ref 12–46)
Monocytes Absolute: 0.4 10*3/uL (ref 0.1–1.0)
Monocytes Relative: 7 % (ref 3–12)
Neutro Abs: 2.8 10*3/uL (ref 1.7–7.7)

## 2012-01-07 LAB — POCT I-STAT TROPONIN I: Troponin i, poc: 0.01 ng/mL (ref 0.00–0.08)

## 2012-01-07 LAB — CBC
HCT: 42.1 % (ref 36.0–46.0)
Hemoglobin: 14.7 g/dL (ref 12.0–15.0)
MCV: 78.5 fL (ref 78.0–100.0)
WBC: 5.8 10*3/uL (ref 4.0–10.5)

## 2012-01-07 LAB — TROPONIN I: Troponin I: <0.3 ng/mL (ref <0.30)

## 2012-01-07 MED ORDER — LEVALBUTEROL HCL 0.63 MG/3ML IN NEBU
0.6300 mg | INHALATION_SOLUTION | Freq: Four times a day (QID) | RESPIRATORY_TRACT | Status: DC | PRN
  Filled 2012-01-07: qty 3

## 2012-01-07 MED ORDER — POTASSIUM CHLORIDE CRYS ER 20 MEQ PO TBCR
40.0000 meq | EXTENDED_RELEASE_TABLET | Freq: Once | ORAL | Status: AC
  Administered 2012-01-07: 40 meq via ORAL
  Filled 2012-01-07: qty 2

## 2012-01-07 MED ORDER — LORAZEPAM 2 MG/ML IJ SOLN
0.5000 mg | Freq: Once | INTRAMUSCULAR | Status: AC
  Administered 2012-01-07: 0.5 mg via INTRAVENOUS
  Filled 2012-01-07: qty 1

## 2012-01-07 MED ORDER — ENOXAPARIN SODIUM 40 MG/0.4ML ~~LOC~~ SOLN
40.0000 mg | Freq: Every day | SUBCUTANEOUS | Status: DC
  Administered 2012-01-07: 40 mg via SUBCUTANEOUS
  Filled 2012-01-07 (×3): qty 0.4

## 2012-01-07 MED ORDER — ALBUTEROL SULFATE (5 MG/ML) 0.5% IN NEBU
INHALATION_SOLUTION | RESPIRATORY_TRACT | Status: AC
  Administered 2012-01-07: 11:00:00
  Filled 2012-01-07: qty 1

## 2012-01-07 MED ORDER — PREDNISONE 20 MG PO TABS
40.0000 mg | ORAL_TABLET | Freq: Every day | ORAL | Status: DC
  Administered 2012-01-08: 40 mg via ORAL
  Filled 2012-01-07 (×2): qty 2

## 2012-01-07 MED ORDER — ACETAMINOPHEN 650 MG RE SUPP: 650.0000 mg | Freq: Four times a day (QID) | RECTAL | Status: DC | PRN

## 2012-01-07 MED ORDER — SODIUM CHLORIDE 0.9 % IV SOLN
Freq: Once | INTRAVENOUS | Status: AC
  Administered 2012-01-07: 12:00:00 via INTRAVENOUS

## 2012-01-07 MED ORDER — LEVALBUTEROL HCL 0.63 MG/3ML IN NEBU
0.6300 mg | INHALATION_SOLUTION | Freq: Four times a day (QID) | RESPIRATORY_TRACT | Status: DC
  Administered 2012-01-07: 0.63 mg via RESPIRATORY_TRACT
  Filled 2012-01-07 (×4): qty 3

## 2012-01-07 MED ORDER — ACETAMINOPHEN 325 MG PO TABS: 650.0000 mg | ORAL_TABLET | Freq: Four times a day (QID) | ORAL | Status: DC | PRN

## 2012-01-07 MED ORDER — IPRATROPIUM BROMIDE 0.02 % IN SOLN
0.5000 mg | Freq: Four times a day (QID) | RESPIRATORY_TRACT | Status: DC
  Administered 2012-01-07: 0.5 mg via RESPIRATORY_TRACT
  Filled 2012-01-07: qty 2.5

## 2012-01-07 MED ORDER — DILTIAZEM HCL 100 MG IV SOLR
10.0000 mg/h | INTRAVENOUS | Status: DC
  Administered 2012-01-07: 5 mg/h via INTRAVENOUS
  Administered 2012-01-08: 10 mg/h via INTRAVENOUS
  Filled 2012-01-07 (×3): qty 100

## 2012-01-07 MED ORDER — TIOTROPIUM BROMIDE MONOHYDRATE 18 MCG IN CAPS
18.0000 ug | ORAL_CAPSULE | Freq: Every day | RESPIRATORY_TRACT | Status: DC
  Administered 2012-01-08: 18 ug via RESPIRATORY_TRACT
  Filled 2012-01-07: qty 5

## 2012-01-07 MED ORDER — DILTIAZEM HCL 25 MG/5ML IV SOLN
20.0000 mg | Freq: Once | INTRAVENOUS | Status: AC
  Administered 2012-01-07: 20 mg via INTRAVENOUS
  Filled 2012-01-07: qty 5

## 2012-01-07 MED ORDER — ASPIRIN EC 325 MG PO TBEC
325.0000 mg | DELAYED_RELEASE_TABLET | Freq: Every day | ORAL | Status: DC
  Administered 2012-01-08: 325 mg via ORAL
  Filled 2012-01-07 (×2): qty 1

## 2012-01-07 MED ORDER — FLUTICASONE-SALMETEROL 100-50 MCG/DOSE IN AEPB
1.0000 | INHALATION_SPRAY | Freq: Two times a day (BID) | RESPIRATORY_TRACT | Status: DC
  Administered 2012-01-07 – 2012-01-08 (×2): 1 via RESPIRATORY_TRACT
  Filled 2012-01-07: qty 14

## 2012-01-07 MED ORDER — GUAIFENESIN-DM 100-10 MG/5ML PO SYRP: 5.0000 mL | ORAL_SOLUTION | ORAL | Status: DC | PRN

## 2012-01-07 MED ORDER — ALBUTEROL SULFATE (5 MG/ML) 0.5% IN NEBU
15.0000 mg | INHALATION_SOLUTION | Freq: Once | RESPIRATORY_TRACT | Status: AC
  Administered 2012-01-07: 15 mg via RESPIRATORY_TRACT
  Filled 2012-01-07 (×2): qty 3

## 2012-01-07 NOTE — ED Notes (Signed)
Pt states she was seen here yesterday for same. States she was feeling all right until this morning began having more sob and wheezing and took her home meds with no relief. Pt reports abdominal pain and nausea "from asthma attack." States she hasn't had any trouble with her asthma in several years and when she gets an attack it makes her anxious. Pt is A/O x4. Skin warm and dry. Respirations even and unlabored. NAD noted at this time.

## 2012-01-07 NOTE — ED Provider Notes (Signed)
History     CSN: [id number]  Arrival date & time 01/07/12  1024   First MD Initiated Contact with Patient 01/07/12 1030     10:57 AM HPI Patient reports shortness of breath and wheezing that began this morning. Reports symptoms are similar to prior asthma attacks. States she was here yesterday for same complaint and had felt better after nebulizer and steroids. Reports symptoms associated with cough and congestion for 2 days. Denies fever, chest pain, back pain, sore throat. Reports prior admissions for asthma exacerbations. Patient is a 61 y.o. female presenting with asthma.  Asthma This is a recurrent problem. The current episode started today. The problem occurs constantly. The problem has been gradually improving. Associated symptoms include congestion and coughing. Pertinent negatives include no abdominal pain, chest pain, diaphoresis, fever, headaches, nausea, sore throat or vomiting. Treatments tried: albuterol. The treatment provided mild relief.    Past Medical History  Diagnosis Date  . Asthma   . Hypertension   . GERD (gastroesophageal reflux disease)     History reviewed. No pertinent past surgical history.  History reviewed. No pertinent family history.  History  Substance Use Topics  . Smoking status: Former Games developer  . Smokeless tobacco: Not on file  . Alcohol Use: No    OB History    Grav Para Term Preterm Abortions TAB SAB Ect Mult Living                  Review of Systems  Constitutional: Negative for fever and diaphoresis.  HENT: Positive for congestion. Negative for ear pain, sore throat, postnasal drip and sinus pressure.   Respiratory: Positive for cough, shortness of breath and wheezing.   Cardiovascular: Negative for chest pain.  Gastrointestinal: Negative for nausea, vomiting and abdominal pain.  Neurological: Negative for headaches.  All other systems reviewed and are negative.    Allergies  Celecoxib  Home Medications   Current  Outpatient Rx  Name Route Sig Dispense Refill  . ALBUTEROL SULFATE HFA 108 (90 BASE) MCG/ACT IN AERS Inhalation Inhale 2 puffs into the lungs every 4 (four) hours as needed for wheezing. 1 each 0  . HYDROCHLOROTHIAZIDE 25 MG PO TABS Oral Take 25 mg by mouth daily.    Marland Kitchen PREDNISONE 20 MG PO TABS Oral Take 2 tablets (40 mg total) by mouth daily. 12 tablet 0  . TIOTROPIUM BROMIDE MONOHYDRATE 18 MCG IN CAPS Inhalation Place 18 mcg into inhaler and inhale daily.      BP 162/93  Temp(Src) 98.4 F (36.9 C) (Oral)  Resp 24  SpO2 99%  Physical Exam  Vitals reviewed. Constitutional: She is oriented to person, place, and time. Vital signs are normal. She appears well-developed and well-nourished.  HENT:  Head: Normocephalic and atraumatic.  Nose: Nose normal.  Mouth/Throat: Oropharynx is clear and moist. No oropharyngeal exudate.  Eyes: Conjunctivae are normal. Pupils are equal, round, and reactive to light.  Neck: Normal range of motion. Neck supple.  Cardiovascular: Normal rate, regular rhythm and normal heart sounds.  Exam reveals no gallop and no friction rub.   No murmur heard. Pulmonary/Chest: Effort normal. She has wheezes (diffuse). She has no rhonchi. She has rales. She exhibits no tenderness.  Musculoskeletal: Normal range of motion.  Lymphadenopathy:    She has no cervical adenopathy.  Neurological: She is alert and oriented to person, place, and time. Coordination normal.  Skin: Skin is warm and dry. No rash noted. No erythema. No pallor.    ED Course  Procedures Results for orders placed during the hospital encounter of 01/07/12  CBC      Component Value Range   WBC 5.8  4.0 - 10.5 (K/uL)   RBC 5.36 (*) 3.87 - 5.11 (MIL/uL)   Hemoglobin 14.7  12.0 - 15.0 (g/dL)   HCT 29.5  62.1 - 30.8 (%)   MCV 78.5  78.0 - 100.0 (fL)   MCH 27.4  26.0 - 34.0 (pg)   MCHC 34.9  30.0 - 36.0 (g/dL)   RDW 65.7  84.6 - 96.2 (%)   Platelets 226  150 - 400 (K/uL)  DIFFERENTIAL      Component  Value Range   Neutrophils Relative 49  43 - 77 (%)   Neutro Abs 2.8  1.7 - 7.7 (K/uL)   Lymphocytes Relative 42  12 - 46 (%)   Lymphs Abs 2.4  0.7 - 4.0 (K/uL)   Monocytes Relative 7  3 - 12 (%)   Monocytes Absolute 0.4  0.1 - 1.0 (K/uL)   Eosinophils Relative 3  0 - 5 (%)   Eosinophils Absolute 0.2  0.0 - 0.7 (K/uL)   Basophils Relative 0  0 - 1 (%)   Basophils Absolute 0.0  0.0 - 0.1 (K/uL)  BASIC METABOLIC PANEL      Component Value Range   Sodium 141  135 - 145 (mEq/L)   Potassium 2.7 (*) 3.5 - 5.1 (mEq/L)   Chloride 102  96 - 112 (mEq/L)   CO2 25  19 - 32 (mEq/L)   Glucose, Bld 101 (*) 70 - 99 (mg/dL)   BUN 14  6 - 23 (mg/dL)   Creatinine, Ser 9.52  0.50 - 1.10 (mg/dL)   Calcium 84.1  8.4 - 10.5 (mg/dL)   GFR calc non Af Amer >90  >90 (mL/min)   GFR calc Af Amer >90  >90 (mL/min)  TROPONIN I      Component Value Range   Troponin I <0.30  <0.30 (ng/mL)  POCT I-STAT TROPONIN I      Component Value Range   Troponin i, poc 0.01  0.00 - 0.08 (ng/mL)   Comment 3           CARDIAC PANEL(CRET KIN+CKTOT+MB+TROPI)      Component Value Range   Total CK 70  7 - 177 (U/L)   CK, MB 2.3  0.3 - 4.0 (ng/mL)   Troponin I <0.30  <0.30 (ng/mL)   Relative Index RELATIVE INDEX IS INVALID  0.0 - 2.5   CARDIAC PANEL(CRET KIN+CKTOT+MB+TROPI)      Component Value Range   Total CK 60  7 - 177 (U/L)   CK, MB 2.1  0.3 - 4.0 (ng/mL)   Troponin I <0.30  <0.30 (ng/mL)   Relative Index RELATIVE INDEX IS INVALID  0.0 - 2.5   CARDIAC PANEL(CRET KIN+CKTOT+MB+TROPI)      Component Value Range   Total CK 58  7 - 177 (U/L)   CK, MB 1.9  0.3 - 4.0 (ng/mL)   Troponin I <0.30  <0.30 (ng/mL)   Relative Index RELATIVE INDEX IS INVALID  0.0 - 2.5   BASIC METABOLIC PANEL      Component Value Range   Sodium 140  135 - 145 (mEq/L)   Potassium 3.2 (*) 3.5 - 5.1 (mEq/L)   Chloride 106  96 - 112 (mEq/L)   CO2 26  19 - 32 (mEq/L)   Glucose, Bld 92  70 - 99 (mg/dL)   BUN 13  6 -  23 (mg/dL)   Creatinine, Ser  1.61  0.50 - 1.10 (mg/dL)   Calcium 9.3  8.4 - 09.6 (mg/dL)   GFR calc non Af Amer >90  >90 (mL/min)   GFR calc Af Amer >90  >90 (mL/min)  CBC      Component Value Range   WBC 4.7  4.0 - 10.5 (K/uL)   RBC 4.58  3.87 - 5.11 (MIL/uL)   Hemoglobin 12.3  12.0 - 15.0 (g/dL)   HCT 04.5  40.9 - 81.1 (%)   MCV 79.0  78.0 - 100.0 (fL)   MCH 26.9  26.0 - 34.0 (pg)   MCHC 34.0  30.0 - 36.0 (g/dL)   RDW 91.4 (*) 78.2 - 15.5 (%)   Platelets 203  150 - 400 (K/uL)  MAGNESIUM      Component Value Range   Magnesium 2.0  1.5 - 2.5 (mg/dL)  TSH      Component Value Range   TSH 2.752  0.350 - 4.500 (uIU/mL)  T4, FREE      Component Value Range   Free T4 1.27  0.80 - 1.80 (ng/dL)   Dg Chest 2 View  9/56/2130  *RADIOLOGY REPORT*  Clinical Data: Asthma, shortness of breath.  CHEST - 2 VIEW  Comparison: 03/24/2010  Findings: There is hyperinflation of the lungs compatible with COPD.  Heart and mediastinal contours are within normal limits.  No focal opacities or effusions.  No acute bony abnormality.  IMPRESSION: COPD.  No active disease.  Original Report Authenticated By: Cyndie Chime, M.D.     MDM  10:57 AM Will order basic labs, and hour-long nebulizer treatments. Discussed possible admission for failed outpatient treatment. Patient states "I think I feel better after receiving treatment."  12:29 PM RN, Mardella Layman informed me patient was having abnormal rhythms on the monitor. Discussed plan with Dr. Juleen China, who has also evaluated the patient. Pt given Cardizem. Pending trop.   2:00 PM 10 minutes tried hospitalist asthma exacerbation and tachycardia. Patient's heart rate has not improved after receiving Cardizem. Discussed this with patient and her daughters. They Agree have her admitted.     Thomasene Lot, PA-C 01/09/12 1559

## 2012-01-07 NOTE — ED Notes (Signed)
Brigitte, PA at bedside. 

## 2012-01-07 NOTE — Progress Notes (Signed)
Patient flipping between Afib and NSR. HR 100-120. Dr Betti Cruz updated, cardizem increased to 10mg /h. Will continue to monitor closely. Marisa Sprinkles RN

## 2012-01-07 NOTE — ED Notes (Signed)
Pt requesting something to eat. Okayed by Vesta Mixer, EDPA. Diet tray ordered.

## 2012-01-07 NOTE — ED Notes (Signed)
Attempt x3 to call report to 4W. RN states she will call me back after she is finished with a  Pt.

## 2012-01-07 NOTE — ED Provider Notes (Signed)
Medical screening examination/treatment/procedure(s) were conducted as a shared visit with non-physician practitioner(s) and myself.  I personally evaluated the patient during the encounter.  EKG:  Rhythm: nsr with intermittent afib with RVR Rate: variable Axis: normal Intervals: RAE, LAE ST segments: NS ST changes.   61yF with dyspnea. Likely 2/2 copd/asthma exacerbation. Improving with nebs. New onset afib. Intermittent with rvr. ccb for rate control. Replete potassium. Check trop for signs of ischemia. Given revisit for same respiratory complaints and new onset afib with rvr will admit for further eval and tx.  Raeford Razor, MD 01/08/12 [id number]

## 2012-01-07 NOTE — H&P (Addendum)
Patient's PCP: [facility]  Chief Complaint: Shortness of breath  History of Present Illness: [name] is a 61 y.o. African American female with history of asthma, hypertension, and GERD who presents with the above complaints.  Patient initially presented on 12/29/2011 with asthma exacerbation, which improved with neb treatments.  She was also prescribed steroid taper.  She presented on 01/06/2012 again with asthma exacerbation which again improved with neb treatments and steroids.  She woke up this morning and was feeling short of breath, she felt like she could not breath.  She also had cough.  Also had chest pain with cough.  She called EMS who came by and ordered to the emergency department for further evaluation.  In the emergency department was again given a neb treatments which improved her breathing.  However noted that patient became tachycardic.  Initially the patient had a heart rate in the 80s but after neb treatments had heart rate ranging from 100s to 160s.  Given frequent emergency room visits and given tachycardia the hospitalist service was asked to admit the patient for further care and management.  Patient denies any recent fevers or chills.  Does complain of being cold at home.  Denies any nausea or vomiting.  Denies any abdominal pain.  Denies any headaches or vision changes.  Did complain of chronic pain in the left lower extremity from a fall about a year ago.  Past Medical History  Diagnosis Date  . Asthma   . Hypertension   . GERD (gastroesophageal reflux disease)    Past Surgical History  Procedure Date  . Tubal ligation    Family History  Problem Relation Age of Onset  . Cancer Mother   . Cancer Father    History   Social History  . Marital Status: Single    Spouse Name: N/A    Number of Children: N/A  . Years of Education: N/A   Occupational History  . Not on file.   Social History Main Topics  . Smoking status: Former Smoker -- 30 years    Quit  date: 11/07/2011  . Smokeless tobacco: Never Used  . Alcohol Use: No  . Drug Use: Yes    Special: Marijuana     reports quitting 1 month ago  . Sexually Active:    Other Topics Concern  . Not on file   Social History Narrative  . No narrative on file   Allergies: Celecoxib  Meds: Scheduled Meds:   . sodium chloride   Intravenous Once  . albuterol  15 mg Nebulization Once  . albuterol      . diltiazem  20 mg Intravenous Once  . LORazepam  0.5 mg Intravenous Once  . potassium chloride  40 mEq Oral Once   Continuous Infusions:   . diltiazem (CARDIZEM) infusion 5 mg/hr (01/07/12 1534)   PRN Meds:.levalbuterol  Review of Systems: All systems reviewed with the patient and positive as per history of present illness, otherwise all other systems are negative.  Physical Exam: Blood pressure 134/79, pulse 161, temperature 98.6 F (37 C), temperature source Oral, resp. rate 26, SpO2 98.00%. General: Awake, Oriented x3, No acute distress. HEENT: EOMI, Moist mucous membranes Neck: Supple CV: S1 and S2, tachycardic, irregularly irregular. Lungs: Clear to ascultation bilaterally Abdomen: Soft, Nontender, Nondistended, +bowel sounds. Ext: Good pulses. Trace edema. No clubbing or cyanosis noted. Neuro: Cranial Nerves II-XII grossly intact. Has 5/5 motor strength in upper and lower extremities.  Lab results:  [facility] 01/07/12 1107  NA  141  K 2.7*  CL 102  CO2 25  GLUCOSE 101*  BUN 14  CREATININE 0.66  CALCIUM 10.2  MG --  PHOS --   No results found for this basename: AST:2,ALT:2,ALKPHOS:2,BILITOT:2,PROT:2,ALBUMIN:2 in the last 72 hours No results found for this basename: LIPASE:2,AMYLASE:2 in the last 72 hours  Basename 01/07/12 1107  WBC 5.8  NEUTROABS 2.8  HGB 14.7  HCT 42.1  MCV 78.5  PLT 226    Basename 01/07/12 1107  CKTOTAL --  CKMB --  CKMBINDEX --  TROPONINI <0.30   No components found with this basename: POCBNP:3 No results found for this  basename: DDIMER in the last 72 hours No results found for this basename: HGBA1C:2 in the last 72 hours No results found for this basename: CHOL:2,HDL:2,LDLCALC:2,TRIG:2,CHOLHDL:2,LDLDIRECT:2 in the last 72 hours No results found for this basename: TSH,T4TOTAL,FREET3,T3FREE,THYROIDAB in the last 72 hours No results found for this basename: VITAMINB12:2,FOLATE:2,FERRITIN:2,TIBC:2,IRON:2,RETICCTPCT:2 in the last 72 hours Imaging results:  Dg Chest 2 View  01/06/2012  *RADIOLOGY REPORT*  Clinical Data: Asthma, shortness of breath.  CHEST - 2 VIEW  Comparison: 03/24/2010  Findings: There is hyperinflation of the lungs compatible with COPD.  Heart and mediastinal contours are within normal limits.  No focal opacities or effusions.  No acute bony abnormality.  IMPRESSION: COPD.  No active disease.  Original Report Authenticated By: Cyndie Chime, M.D.   Other results: EKG: Atrial tachycardia with rapid rate.  Assessment & Plan by Problem: Acute respiratory failure secondary to asthma exacerbation. Patient reports that she quit smoking marijuana about 2 weeks ago.  Uncertain if the patient has underlying COPD, may benefit from outpatient pulmonary function tests for baseline PFTs.  Continue steroids.  Patient to have completed 7 day course of steroid taper as of yesterday, will extend patient's course of steroids.  Continue Spiriva.  Changed albuterol to levalbuterol given tachycardia.  Add ipratropium neb treatment.  Continue Spiriva.  Add Advair at discharge.  Tachycardia Appears to be atrial tachycardia.  Question A. fib versus MAT?  Get 2-D echocardiogram.  Start patient on diltiazem.  Suspect tachycardia likely due to albuterol treatment.  Discontinue albuterol and transitioned to levalbuterol.  Trend troponins x3 to rule out acute coronary syndrome, initial troponin negative.  Replace potassium.  Patient has a CHADs2 score of 1 and CHA2DS2-VASc of 2.  Hypertension Initially elevated on  presentation.  Hold hydrochlorothiazide given the patient to be started on diltiazem drip.  Continue to monitor.  GERD Stable.  Hypokalemia Replace as needed.  Check a magnesium tomorrow.  GERD Not endorsing any symptoms at this time.  Prophylaxis Lovenox.  CODE STATUS Full code.  Time spent on admission, talking to the patient, and coordinating care was: 60 mins.  Ollen Rao A, MD 01/07/2012, 3:40 PM  Addendum: Discussed EKG findings with Dr. Kirtland Bouchard, cardiology, did not think that the patient had A. fib.  Thought that the patient likely had sinus tachycardia.  Continue to monitor.  Kyeshia Zinn A, MD 01/07/2012, 4:19 PM

## 2012-01-07 NOTE — ED Notes (Signed)
[id number]<AV> Expected date:<BR> Expected time:10:17 AM<BR> Means of arrival:<BR> Comments:<BR> M100 - 61yoF SOB, wheezing, neb by ems

## 2012-01-07 NOTE — ED Notes (Signed)
Attempt x1 to call report to 4W. States RN is in a pt room and to try calling back later.

## 2012-01-07 NOTE — ED Notes (Signed)
Attempt x2 to call report to 4W. States RN is on phone and requests a call back shortly.

## 2012-01-07 NOTE — ED Notes (Signed)
Per EMS: Pt began having sob and wheezing yesterday, took home treatments with no relief. Worse this morning. Hx of asthma. Pt received 5 mg albuterol pta.   NAD noted at this time.

## 2012-01-08 DIAGNOSIS — J9600 Acute respiratory failure, unspecified whether with hypoxia or hypercapnia: Secondary | ICD-10-CM

## 2012-01-08 DIAGNOSIS — J45901 Unspecified asthma with (acute) exacerbation: Secondary | ICD-10-CM

## 2012-01-08 DIAGNOSIS — I10 Essential (primary) hypertension: Secondary | ICD-10-CM

## 2012-01-08 DIAGNOSIS — R000 Tachycardia, unspecified: Secondary | ICD-10-CM

## 2012-01-08 LAB — CBC
Hemoglobin: 12.3 g/dL (ref 12.0–15.0)
MCHC: 34 g/dL (ref 30.0–36.0)
RDW: 15.6 % — ABNORMAL HIGH (ref 11.5–15.5)
WBC: 4.7 10*3/uL (ref 4.0–10.5)

## 2012-01-08 LAB — BASIC METABOLIC PANEL
BUN: 13 mg/dL (ref 6–23)
GFR calc Af Amer: >90 mL/min (ref >90)
GFR calc non Af Amer: >90 mL/min (ref >90)
Potassium: 3.2 mEq/L — ABNORMAL LOW (ref 3.5–5.1)

## 2012-01-08 LAB — CARDIAC PANEL(CRET KIN+CKTOT+MB+TROPI)
CK, MB: 1.9 ng/mL (ref 0.3–4.0)
Relative Index: INVALID (ref 0.0–2.5)
Troponin I: <0.3 ng/mL (ref <0.30)

## 2012-01-08 LAB — MAGNESIUM: Magnesium: 2 mg/dL (ref 1.5–2.5)

## 2012-01-08 MED ORDER — PREDNISONE 10 MG PO TABS: ORAL_TABLET | ORAL | Status: DC

## 2012-01-08 MED ORDER — ASPIRIN EC 81 MG PO TBEC: 81.0000 mg | DELAYED_RELEASE_TABLET | Freq: Every day | ORAL | Status: AC

## 2012-01-08 MED ORDER — IPRATROPIUM BROMIDE 0.02 % IN SOLN: 0.5000 mg | Freq: Three times a day (TID) | RESPIRATORY_TRACT | Status: DC

## 2012-01-08 MED ORDER — DILTIAZEM HCL 60 MG PO TABS: 60.0000 mg | ORAL_TABLET | Freq: Two times a day (BID) | ORAL | Status: DC

## 2012-01-08 MED ORDER — FLUTICASONE-SALMETEROL 100-50 MCG/DOSE IN AEPB: 1.0000 | INHALATION_SPRAY | Freq: Two times a day (BID) | RESPIRATORY_TRACT | Status: DC

## 2012-01-08 MED ORDER — DILTIAZEM HCL 60 MG PO TABS
60.0000 mg | ORAL_TABLET | Freq: Two times a day (BID) | ORAL | Status: DC
  Administered 2012-01-08: 60 mg via ORAL
  Filled 2012-01-08 (×2): qty 1

## 2012-01-08 MED ORDER — LEVALBUTEROL HCL 0.63 MG/3ML IN NEBU
0.6300 mg | INHALATION_SOLUTION | Freq: Three times a day (TID) | RESPIRATORY_TRACT | Status: DC
  Administered 2012-01-08 (×2): 0.63 mg via RESPIRATORY_TRACT
  Filled 2012-01-08 (×4): qty 3

## 2012-01-08 MED ORDER — POTASSIUM CHLORIDE CRYS ER 20 MEQ PO TBCR
20.0000 meq | EXTENDED_RELEASE_TABLET | Freq: Once | ORAL | Status: AC
  Administered 2012-01-08: 20 meq via ORAL
  Filled 2012-01-08: qty 1

## 2012-01-08 NOTE — Progress Notes (Signed)
Patient discharged home Discharge instructions given and explained to patient and she verbalized understanding. Patient denies any distress/pain. Transported to the car via wheelchair by staff; Patient picked up prednisone from pharmacy prior to discharge. Skin intact, no  Wound noted.

## 2012-01-08 NOTE — Progress Notes (Signed)
Cm spoke with pt concerning dc planning. Pt eligible for medication assistance, Rx taken to pharmacy for prednisone. Pt instructed to follow up with [facility] upon dc for follow up appt, she is an established pt for PCP services at clinic. Other rx ineligble for assistance form indigent funds due to medications generic. No other needs stated by pt.   Leonie Green [phone]

## 2012-01-08 NOTE — Progress Notes (Signed)
Patient was admitted for shortness of breath and cough. She has a history of smoking and asthma. Her chest xray shows hyperinflation consistent with COPD. She is on room air and breath sounds are clear to diminished. RT will change to Xopenex and Atrovent TID. Continue Spiriva and Advair.

## 2012-01-08 NOTE — Discharge Summary (Signed)
Discharge Summary  [name] MR#: [id number]  DOB:1950-11-10  Date of Admission: 01/07/2012 Date of Discharge: 01/08/2012  Patient's PCP: [facility]  Attending Physician:Izaac Reisig A  Consults: None.  Discharge Diagnoses: Principal Problem:  *Acute respiratory failure Active Problems:  HYPERTENSION  ASTHMA  GERD  WEIGHT LOSS, ABNORMAL  Tachycardia  Hypokalemia   Brief Admitting History and Physical [name] is a 61 y.o. African American female with history of asthma, hypertension, and GERD who presented with shortness of breath on 01/07/2012.  Discharge Medications Medication List  As of 01/08/2012  1:30 PM   STOP taking these medications         hydrochlorothiazide 25 MG tablet         TAKE these medications         albuterol 108 (90 BASE) MCG/ACT inhaler   Commonly known as: PROVENTIL HFA;VENTOLIN HFA   Inhale 2 puffs into the lungs every 4 (four) hours as needed for wheezing.      aspirin EC 81 MG tablet   Take 1 tablet (81 mg total) by mouth daily.      diltiazem 60 MG tablet   Commonly known as: CARDIZEM   Take 1 tablet (60 mg total) by mouth every 12 (twelve) hours.      Fluticasone-Salmeterol 100-50 MCG/DOSE Aepb   Commonly known as: ADVAIR   Inhale 1 puff into the lungs 2 (two) times daily.      predniSONE 10 MG tablet   Commonly known as: DELTASONE   Take 40 mg po daily for 2 days, then 20 mg po daily for 2 days, then 10 mg po daily for 2 days, then 5 mg po daily for 2 days then discontinue.      tiotropium 18 MCG inhalation capsule   Commonly known as: SPIRIVA   Place 18 mcg into inhaler and inhale daily.            Hospital Course: Acute respiratory failure secondary to asthma exacerbation.  Patient reports that she quit smoking marijuana about 2 months ago. Uncertain if the patient has underlying COPD, may benefit from outpatient pulmonary function tests for baseline PFTs. Continued oral steroids on admission.  Extended prednisone  for 7 more days at discharge.  Continue Spiriva.  Initially on admission changed albuterol to levalbuterol given tachycardia. Add ipratropium neb treatment. Continue Spiriva.  Patient to resume albuterol prior to discharge given improvement in tachycardia.  Patient reported that she had been taking albuterol more frequently than she was supposed to at home.  Instructed the patient on proper use on taking albuterol.  Also added Advair to prevent asthma attacks which is to be started once patient completes prednisone.  Tachycardia  Sinus tachycardia.  Likely due to over use of albuterol at home prior to admission and in the emergency department.  Resolved.  Was on diltiazem drip which has been transitioned to oral.  Patient given one-month course of diltiazem.  Patient was instructed to follow up with her primary care physician, [facility], for more instructions.  Hypertension  Initially elevated on presentation.  Discontinue hydrochlorothiazide transitioned to diltiazem at discharge.  Continue to monitor, further titration of antihypertensive medications as outpatient.  Hypokalemia  Replace as needed.  Magnesium level normal.  Hypokalemia may be due to albuterol use.  GERD  Not endorsing any symptoms at this time.   Day of Discharge BP 108/72  Pulse 91  Temp(Src) 97.8 F (36.6 C) (Oral)  Resp 18  Ht 5\' 7"  (1.702 m)  Wt 54.2  kg (119 lb 7.8 oz)  BMI 18.71 kg/m2  SpO2 98%  Results for orders placed during the hospital encounter of 01/07/12 (from the past 48 hour(s))  CBC     Status: Abnormal   Collection Time   01/07/12 11:07 AM      Component Value Range Comment   WBC 5.8  4.0 - 10.5 (K/uL)    RBC 5.36 (*) 3.87 - 5.11 (MIL/uL)    Hemoglobin 14.7  12.0 - 15.0 (g/dL)    HCT 40.9  81.1 - 91.4 (%)    MCV 78.5  78.0 - 100.0 (fL)    MCH 27.4  26.0 - 34.0 (pg)    MCHC 34.9  30.0 - 36.0 (g/dL)    RDW 78.2  95.6 - 21.3 (%)    Platelets 226  150 - 400 (K/uL)   DIFFERENTIAL     Status:  Normal   Collection Time   01/07/12 11:07 AM      Component Value Range Comment   Neutrophils Relative 49  43 - 77 (%)    Neutro Abs 2.8  1.7 - 7.7 (K/uL)    Lymphocytes Relative 42  12 - 46 (%)    Lymphs Abs 2.4  0.7 - 4.0 (K/uL)    Monocytes Relative 7  3 - 12 (%)    Monocytes Absolute 0.4  0.1 - 1.0 (K/uL)    Eosinophils Relative 3  0 - 5 (%)    Eosinophils Absolute 0.2  0.0 - 0.7 (K/uL)    Basophils Relative 0  0 - 1 (%)    Basophils Absolute 0.0  0.0 - 0.1 (K/uL)   BASIC METABOLIC PANEL     Status: Abnormal   Collection Time   01/07/12 11:07 AM      Component Value Range Comment   Sodium 141  135 - 145 (mEq/L)    Potassium 2.7 (*) 3.5 - 5.1 (mEq/L)    Chloride 102  96 - 112 (mEq/L)    CO2 25  19 - 32 (mEq/L)    Glucose, Bld 101 (*) 70 - 99 (mg/dL)    BUN 14  6 - 23 (mg/dL)    Creatinine, Ser 0.86  0.50 - 1.10 (mg/dL)    Calcium 57.8  8.4 - 10.5 (mg/dL)    GFR calc non Af Amer >90  >90 (mL/min)    GFR calc Af Amer >90  >90 (mL/min)   TROPONIN I     Status: Normal   Collection Time   01/07/12 11:07 AM      Component Value Range Comment   Troponin I <0.30  <0.30 (ng/mL)   POCT I-STAT TROPONIN I     Status: Normal   Collection Time   01/07/12 12:49 PM      Component Value Range Comment   Troponin i, poc 0.01  0.00 - 0.08 (ng/mL)    Comment 3            CARDIAC PANEL(CRET KIN+CKTOT+MB+TROPI)     Status: Normal   Collection Time   01/07/12  4:18 PM      Component Value Range Comment   Total CK 70  7 - 177 (U/L)    CK, MB 2.3  0.3 - 4.0 (ng/mL)    Troponin I <0.30  <0.30 (ng/mL)    Relative Index RELATIVE INDEX IS INVALID  0.0 - 2.5    CARDIAC PANEL(CRET KIN+CKTOT+MB+TROPI)     Status: Normal   Collection Time   01/07/12  9:50 PM  Component Value Range Comment   Total CK 60  7 - 177 (U/L)    CK, MB 2.1  0.3 - 4.0 (ng/mL)    Troponin I <0.30  <0.30 (ng/mL)    Relative Index RELATIVE INDEX IS INVALID  0.0 - 2.5    CARDIAC PANEL(CRET KIN+CKTOT+MB+TROPI)     Status:  Normal   Collection Time   01/08/12  3:35 AM      Component Value Range Comment   Total CK 58  7 - 177 (U/L)    CK, MB 1.9  0.3 - 4.0 (ng/mL)    Troponin I <0.30  <0.30 (ng/mL)    Relative Index RELATIVE INDEX IS INVALID  0.0 - 2.5    BASIC METABOLIC PANEL     Status: Abnormal   Collection Time   01/08/12  3:35 AM      Component Value Range Comment   Sodium 140  135 - 145 (mEq/L)    Potassium 3.2 (*) 3.5 - 5.1 (mEq/L)    Chloride 106  96 - 112 (mEq/L)    CO2 26  19 - 32 (mEq/L)    Glucose, Bld 92  70 - 99 (mg/dL)    BUN 13  6 - 23 (mg/dL)    Creatinine, Ser 1.61  0.50 - 1.10 (mg/dL)    Calcium 9.3  8.4 - 10.5 (mg/dL)    GFR calc non Af Amer >90  >90 (mL/min)    GFR calc Af Amer >90  >90 (mL/min)   CBC     Status: Abnormal   Collection Time   01/08/12  3:35 AM      Component Value Range Comment   WBC 4.7  4.0 - 10.5 (K/uL)    RBC 4.58  3.87 - 5.11 (MIL/uL)    Hemoglobin 12.3  12.0 - 15.0 (g/dL)    HCT 09.6  04.5 - 40.9 (%)    MCV 79.0  78.0 - 100.0 (fL)    MCH 26.9  26.0 - 34.0 (pg)    MCHC 34.0  30.0 - 36.0 (g/dL)    RDW 81.1 (*) 91.4 - 15.5 (%)    Platelets 203  150 - 400 (K/uL)   MAGNESIUM     Status: Normal   Collection Time   01/08/12  3:35 AM      Component Value Range Comment   Magnesium 2.0  1.5 - 2.5 (mg/dL)     Dg Chest 2 View  7/82/9562  *RADIOLOGY REPORT*  Clinical Data: Asthma, shortness of breath.  CHEST - 2 VIEW  Comparison: 03/24/2010  Findings: There is hyperinflation of the lungs compatible with COPD.  Heart and mediastinal contours are within normal limits.  No focal opacities or effusions.  No acute bony abnormality.  IMPRESSION: COPD.  No active disease.  Original Report Authenticated By: Cyndie Chime, M.D.   2-D echocardiogram on May 18 of 2013 Study Conclusions - Left ventricle: The cavity size was normal. Wall thickness was normal. Systolic function was normal. The estimated ejection fraction was in the range of 50% to 55%. Wall motion was normal;  there were no regional wall motion abnormalities. Left ventricular diastolic function parameters were normal. There was no evidence of elevated ventricular filling pressure by Doppler parameters. - Aortic valve: There was no stenosis. No significant regurgitation. - Aortic root: The aortic root was normal in size. - Mitral valve: Leaflet separation was normal. - Atrial septum: No defect or patent foramen ovale was identified. - Tricuspid valve: Structurally normal valve. -  Normal pulmonary artery pressure.  Disposition: Home  Diet: Heart healthy diet  Activity: Resume as tolerated   Follow-up Appts: Discharge Orders    Future Orders Please Complete By Expires   Diet - low sodium heart healthy      Increase activity slowly      Discharge instructions      Comments:   Followup with [facility] (PCP) in 1 week (or as soon as possible).      TESTS THAT NEED FOLLOW-UP None  Time spent on discharge, talking to the patient, and coordinating care: 25 mins.   Signed: Cristal Ford, MD 01/08/2012, 1:30 PM

## 2012-01-08 NOTE — Progress Notes (Signed)
Subjective: No specific concerns. In normal sinus rhythm this AM.  Patient wondering if she can be discharged.  Objective: Vital signs in last 24 hours: Filed Vitals:   01/08/12 0054 01/08/12 0356 01/08/12 0603 01/08/12 0821  BP:  133/80    Pulse: 72 69    Temp:  97.8 F (36.6 C)    TempSrc:  Oral    Resp:  18    Height:      Weight:   54.2 kg (119 lb 7.8 oz)   SpO2:  99%  98%   Weight change:   Intake/Output Summary (Last 24 hours) at 01/08/12 1119 Last data filed at 01/08/12 3382  Gross per 24 hour  Intake 714.42 ml  Output    450 ml  Net 264.42 ml    Physical Exam: General: Awake, Oriented, No acute distress. HEENT: EOMI. Neck: Supple CV: S1 and S2 Lungs: Clear to ascultation bilaterally Abdomen: Soft, Nontender, Nondistended, +bowel sounds. Ext: Good pulses. Trace edema.  Lab Results:  Basename 01/08/12 0335 01/07/12 1107  NA 140 141  K 3.2* 2.7*  CL 106 102  CO2 26 25  GLUCOSE 92 101*  BUN 13 14  CREATININE 0.66 0.66  CALCIUM 9.3 10.2  MG 2.0 --  PHOS -- --   No results found for this basename: AST:2,ALT:2,ALKPHOS:2,BILITOT:2,PROT:2,ALBUMIN:2 in the last 72 hours No results found for this basename: LIPASE:2,AMYLASE:2 in the last 72 hours  Basename 01/08/12 0335 01/07/12 1107  WBC 4.7 5.8  NEUTROABS -- 2.8  HGB 12.3 14.7  HCT 36.2 42.1  MCV 79.0 78.5  PLT 203 226    Basename 01/08/12 0335 01/07/12 2150 01/07/12 1618  CKTOTAL 58 60 70  CKMB 1.9 2.1 2.3  CKMBINDEX -- -- --  TROPONINI <0.30 <0.30 <0.30   No components found with this basename: POCBNP:3 No results found for this basename: DDIMER:2 in the last 72 hours No results found for this basename: HGBA1C:2 in the last 72 hours No results found for this basename: CHOL:2,HDL:2,LDLCALC:2,TRIG:2,CHOLHDL:2,LDLDIRECT:2 in the last 72 hours No results found for this basename: TSH,T4TOTAL,FREET3,T3FREE,THYROIDAB in the last 72 hours No results found for this basename:  VITAMINB12:2,FOLATE:2,FERRITIN:2,TIBC:2,IRON:2,RETICCTPCT:2 in the last 72 hours  Micro Results: No results found for this or any previous visit (from the past 240 hour(s)).  Studies/Results: Dg Chest 2 View  01/06/2012  *RADIOLOGY REPORT*  Clinical Data: Asthma, shortness of breath.  CHEST - 2 VIEW  Comparison: 03/24/2010  Findings: There is hyperinflation of the lungs compatible with COPD.  Heart and mediastinal contours are within normal limits.  No focal opacities or effusions.  No acute bony abnormality.  IMPRESSION: COPD.  No active disease.  Original Report Authenticated By: Cyndie Chime, M.D.    Medications: I have reviewed the patient's current medications. Scheduled Meds:   . sodium chloride   Intravenous Once  . aspirin EC  325 mg Oral Daily  . diltiazem  20 mg Intravenous Once  . diltiazem  60 mg Oral Q12H  . enoxaparin  40 mg Subcutaneous QHS  . Fluticasone-Salmeterol  1 puff Inhalation BID  . levalbuterol  0.63 mg Nebulization TID  . LORazepam  0.5 mg Intravenous Once  . potassium chloride  20 mEq Oral Once  . potassium chloride  40 mEq Oral Once  . predniSONE  40 mg Oral Q breakfast  . tiotropium  18 mcg Inhalation Daily  . DISCONTD: ipratropium  0.5 mg Nebulization Q6H  . DISCONTD: ipratropium  0.5 mg Nebulization TID  . DISCONTD: levalbuterol  0.63 mg Nebulization Q6H   Continuous Infusions:   . DISCONTD: diltiazem (CARDIZEM) infusion 10 mg/hr (01/08/12 1044)   PRN Meds:.acetaminophen, acetaminophen, guaiFENesin-dextromethorphan, levalbuterol, DISCONTD: levalbuterol  2-D echocardiogram on May 18 of 2013 Study Conclusions - Left ventricle: The cavity size was normal. Wall thickness was normal. Systolic function was normal. The estimated ejection fraction was in the range of 50% to 55%. Wall motion was normal; there were no regional wall motion abnormalities. Left ventricular diastolic function parameters were normal. There was no evidence of elevated ventricular  filling pressure by Doppler parameters. - Aortic valve: There was no stenosis. No significant regurgitation. - Aortic root: The aortic root was normal in size. - Mitral valve: Leaflet separation was normal. - Atrial septum: No defect or patent foramen ovale was identified. - Tricuspid valve: Structurally normal valve. - Normal pulmonary artery pressure.  Assessment/Plan: Acute respiratory failure secondary to asthma exacerbation.  Patient reports that she quit smoking marijuana about 2 months ago. Uncertain if the patient has underlying COPD, may benefit from outpatient pulmonary function tests for baseline PFTs. Continue steroids.  Extent prednisone for 7 more days.  Continue Spiriva. Changed albuterol to levalbuterol given tachycardia. Add ipratropium neb treatment. Continue Spiriva.  Patient to resume albuterol prior to discharge.  Patient reported that she had been taking albuterol more frequently than she was supposed to at home.  Instructed the patient on proper use on taking albuterol.  Also added Advair to prevent asthma attacks which is to be started once patient completes prednisone.  Tachycardia  Sinus tachycardia.  Likely due to over use of albuterol at home prior to admission and in the emergency department.  Resolved.  Was on diltiazem drip which has been transitioned to oral.  Patient given one-month course of diltiazem.  Patient was instructed to follow up with her primary care physician, [facility], for more instructions.  Hypertension  Initially elevated on presentation.  Discontinue hydrochlorothiazide transitioned to diltiazem at discharge.  Continue to monitor, further titration of antihypertensive medications as outpatient..   Hypokalemia  Replace as needed.  Magnesium level normal.  Hypokalemia may be due to albuterol use.  GERD  Not endorsing any symptoms at this time.   Prophylaxis  Lovenox.   CODE STATUS  Full code.  Disposition Discharge the patient  today.   LOS: 1 day  Zackrey Dyar A, MD 01/08/2012, 11:19 AM

## 2012-01-08 NOTE — Progress Notes (Signed)
  Echocardiogram 2D Echocardiogram has been performed.  [name] J 01/08/2012, 10:17 AM

## 2012-01-09 LAB — TSH: TSH: 2.752 u[IU]/mL (ref 0.350–4.500)

## 2012-01-16 NOTE — ED Provider Notes (Signed)
Medical screening examination/treatment/procedure(s) were performed by non-physician practitioner and as supervising physician I was immediately available for consultation/collaboration.  Desmen Schoffstall, MD 01/16/12 1454 

## 2012-01-18 ENCOUNTER — Encounter (HOSPITAL_COMMUNITY): Payer: Self-pay | Admitting: *Deleted

## 2012-01-18 ENCOUNTER — Inpatient Hospital Stay (HOSPITAL_COMMUNITY)
Admission: EM | Admit: 2012-01-18 | Discharge: 2012-01-19 | DRG: 309 | Disposition: A | Discharge status: Home / Self Care | Payer: Self-pay | Source: Ambulatory Visit | Attending: Family Medicine | Admitting: Family Medicine

## 2012-01-18 ENCOUNTER — Emergency Department (HOSPITAL_COMMUNITY): Payer: Self-pay

## 2012-01-18 DIAGNOSIS — J45901 Unspecified asthma with (acute) exacerbation: Secondary | ICD-10-CM

## 2012-01-18 DIAGNOSIS — Z7982 Long term (current) use of aspirin: Secondary | ICD-10-CM

## 2012-01-18 DIAGNOSIS — I10 Essential (primary) hypertension: Secondary | ICD-10-CM

## 2012-01-18 DIAGNOSIS — J441 Chronic obstructive pulmonary disease with (acute) exacerbation: Secondary | ICD-10-CM | POA: Diagnosis present

## 2012-01-18 DIAGNOSIS — Z87891 Personal history of nicotine dependence: Secondary | ICD-10-CM

## 2012-01-18 DIAGNOSIS — Z888 Allergy status to other drugs, medicaments and biological substances status: Secondary | ICD-10-CM

## 2012-01-18 DIAGNOSIS — J9600 Acute respiratory failure, unspecified whether with hypoxia or hypercapnia: Secondary | ICD-10-CM

## 2012-01-18 DIAGNOSIS — I4891 Unspecified atrial fibrillation: Principal | ICD-10-CM | POA: Diagnosis present

## 2012-01-18 DIAGNOSIS — J45909 Unspecified asthma, uncomplicated: Secondary | ICD-10-CM

## 2012-01-18 DIAGNOSIS — R000 Tachycardia, unspecified: Secondary | ICD-10-CM | POA: Diagnosis present

## 2012-01-18 DIAGNOSIS — R634 Abnormal weight loss: Secondary | ICD-10-CM

## 2012-01-18 DIAGNOSIS — R0603 Acute respiratory distress: Secondary | ICD-10-CM

## 2012-01-18 DIAGNOSIS — E876 Hypokalemia: Secondary | ICD-10-CM

## 2012-01-18 DIAGNOSIS — K219 Gastro-esophageal reflux disease without esophagitis: Secondary | ICD-10-CM | POA: Diagnosis present

## 2012-01-18 LAB — APTT: aPTT: 26 seconds (ref 24–37)

## 2012-01-18 LAB — CARDIAC PANEL(CRET KIN+CKTOT+MB+TROPI)
CK, MB: 2.8 ng/mL (ref 0.3–4.0)
Relative Index: INVALID (ref 0.0–2.5)
Total CK: 64 U/L (ref 7–177)

## 2012-01-18 LAB — DIFFERENTIAL
Basophils Absolute: 0 10*3/uL (ref 0.0–0.1)
Basophils Relative: 0 % (ref 0–1)
Eosinophils Absolute: 0.3 10*3/uL (ref 0.0–0.7)
Neutro Abs: 3.2 10*3/uL (ref 1.7–7.7)
Neutrophils Relative %: 43 % (ref 43–77)

## 2012-01-18 LAB — POCT I-STAT, CHEM 8
BUN: 21 mg/dL (ref 6–23)
Creatinine, Ser: 0.8 mg/dL (ref 0.50–1.10)
Potassium: 4.6 mEq/L (ref 3.5–5.1)
Sodium: 147 mEq/L — ABNORMAL HIGH (ref 135–145)
TCO2: 28 mmol/L (ref 0–100)

## 2012-01-18 LAB — CBC
Hemoglobin: 14.4 g/dL (ref 12.0–15.0)
MCH: 27.3 pg (ref 26.0–34.0)
MCHC: 34.2 g/dL (ref 30.0–36.0)
Platelets: 250 10*3/uL (ref 150–400)
RDW: 16.1 % — ABNORMAL HIGH (ref 11.5–15.5)

## 2012-01-18 LAB — HEPARIN LEVEL (UNFRACTIONATED): Heparin Unfractionated: 0.66 IU/mL (ref 0.30–0.70)

## 2012-01-18 LAB — POCT I-STAT TROPONIN I: Troponin i, poc: 0 ng/mL (ref 0.00–0.08)

## 2012-01-18 LAB — PROTIME-INR
INR: 0.91 (ref 0.00–1.49)
Prothrombin Time: 12.5 seconds (ref 11.6–15.2)

## 2012-01-18 LAB — D-DIMER, QUANTITATIVE: D-Dimer, Quant: 0.32 ug/mL-FEU (ref 0.00–0.48)

## 2012-01-18 LAB — RAPID URINE DRUG SCREEN, HOSP PERFORMED: Barbiturates: NOT DETECTED

## 2012-01-18 LAB — TSH: TSH: 0.756 u[IU]/mL (ref 0.350–4.500)

## 2012-01-18 MED ORDER — ONDANSETRON HCL 4 MG/2ML IJ SOLN: 4.0000 mg | Freq: Three times a day (TID) | INTRAMUSCULAR | Status: DC | PRN

## 2012-01-18 MED ORDER — DILTIAZEM HCL ER COATED BEADS 120 MG PO CP24
120.0000 mg | ORAL_CAPSULE | Freq: Every day | ORAL | Status: DC
  Administered 2012-01-18 – 2012-01-19 (×2): 120 mg via ORAL
  Filled 2012-01-18 (×3): qty 1

## 2012-01-18 MED ORDER — DILTIAZEM HCL 25 MG/5ML IV SOLN
INTRAVENOUS | Status: AC
  Filled 2012-01-18: qty 5

## 2012-01-18 MED ORDER — SODIUM CHLORIDE 0.9 % IV SOLN
INTRAVENOUS | Status: AC
  Administered 2012-01-18: 08:00:00 via INTRAVENOUS

## 2012-01-18 MED ORDER — LEVALBUTEROL HCL 0.63 MG/3ML IN NEBU
0.6300 mg | INHALATION_SOLUTION | Freq: Four times a day (QID) | RESPIRATORY_TRACT | Status: DC
  Administered 2012-01-18 – 2012-01-19 (×3): 0.63 mg via RESPIRATORY_TRACT
  Filled 2012-01-18 (×13): qty 3

## 2012-01-18 MED ORDER — SODIUM CHLORIDE 0.9 % IV SOLN
INTRAVENOUS | Status: AC
  Administered 2012-01-18: 09:00:00 via INTRAVENOUS

## 2012-01-18 MED ORDER — COUMADIN BOOK
Freq: Once | Status: AC
  Administered 2012-01-18: 18:00:00
  Filled 2012-01-18: qty 1

## 2012-01-18 MED ORDER — HEPARIN (PORCINE) IN NACL 100-0.45 UNIT/ML-% IJ SOLN
700.0000 [IU]/h | INTRAMUSCULAR | Status: DC
  Filled 2012-01-18 (×3): qty 250

## 2012-01-18 MED ORDER — ACETAMINOPHEN 650 MG RE SUPP: 650.0000 mg | Freq: Four times a day (QID) | RECTAL | Status: DC | PRN

## 2012-01-18 MED ORDER — ACETAMINOPHEN 325 MG PO TABS: 650.0000 mg | ORAL_TABLET | Freq: Four times a day (QID) | ORAL | Status: DC | PRN

## 2012-01-18 MED ORDER — WARFARIN VIDEO
Freq: Once | Status: AC
  Administered 2012-01-18: 17:00:00

## 2012-01-18 MED ORDER — DILTIAZEM HCL 100 MG IV SOLR
5.0000 mg/h | Freq: Once | INTRAVENOUS | Status: DC
  Filled 2012-01-18: qty 100

## 2012-01-18 MED ORDER — ALBUTEROL (5 MG/ML) CONTINUOUS INHALATION SOLN
10.0000 mg | INHALATION_SOLUTION | RESPIRATORY_TRACT | Status: DC
  Administered 2012-01-18: 10 mg via RESPIRATORY_TRACT
  Filled 2012-01-18: qty 20

## 2012-01-18 MED ORDER — SENNA 8.6 MG PO TABS
1.0000 | ORAL_TABLET | Freq: Two times a day (BID) | ORAL | Status: DC
  Administered 2012-01-18 – 2012-01-19 (×3): 8.6 mg via ORAL
  Filled 2012-01-18 (×6): qty 1

## 2012-01-18 MED ORDER — IPRATROPIUM BROMIDE 0.02 % IN SOLN
0.5000 mg | Freq: Four times a day (QID) | RESPIRATORY_TRACT | Status: DC
  Administered 2012-01-18 – 2012-01-19 (×3): 0.5 mg via RESPIRATORY_TRACT
  Filled 2012-01-18 (×4): qty 2.5

## 2012-01-18 MED ORDER — SODIUM CHLORIDE 0.9 % IJ SOLN
3.0000 mL | Freq: Two times a day (BID) | INTRAMUSCULAR | Status: DC
  Administered 2012-01-18 (×2): 3 mL via INTRAVENOUS

## 2012-01-18 MED ORDER — SODIUM CHLORIDE 0.9 % IV SOLN: INTRAVENOUS | Status: DC

## 2012-01-18 MED ORDER — HYDROCODONE-ACETAMINOPHEN 5-325 MG PO TABS: 1.0000 | ORAL_TABLET | ORAL | Status: DC | PRN

## 2012-01-18 MED ORDER — HEPARIN BOLUS VIA INFUSION
5000.0000 [IU] | Freq: Once | INTRAVENOUS | Status: AC
  Administered 2012-01-18: 5000 [IU] via INTRAVENOUS

## 2012-01-18 MED ORDER — WARFARIN SODIUM 5 MG PO TABS
5.0000 mg | ORAL_TABLET | Freq: Once | ORAL | Status: AC
  Administered 2012-01-18: 5 mg via ORAL
  Filled 2012-01-18: qty 1

## 2012-01-18 MED ORDER — ALBUTEROL SULFATE (5 MG/ML) 0.5% IN NEBU: 2.5000 mg | INHALATION_SOLUTION | RESPIRATORY_TRACT | Status: DC

## 2012-01-18 MED ORDER — DILTIAZEM HCL 25 MG/5ML IV SOLN: 20.0000 mg | Freq: Once | INTRAVENOUS | Status: DC

## 2012-01-18 MED ORDER — HEPARIN (PORCINE) IN NACL 100-0.45 UNIT/ML-% IJ SOLN
700.0000 [IU]/h | INTRAMUSCULAR | Status: AC
  Administered 2012-01-18: 700 [IU]/h via INTRAVENOUS
  Filled 2012-01-18: qty 250

## 2012-01-18 MED ORDER — METHYLPREDNISOLONE SODIUM SUCC 125 MG IJ SOLR
60.0000 mg | Freq: Two times a day (BID) | INTRAMUSCULAR | Status: DC
  Administered 2012-01-18 – 2012-01-19 (×2): 60 mg via INTRAVENOUS
  Filled 2012-01-18 (×5): qty 0.96

## 2012-01-18 MED ORDER — WARFARIN - PHARMACIST DOSING INPATIENT: Freq: Every day | Status: DC

## 2012-01-18 MED ORDER — GUAIFENESIN-DM 100-10 MG/5ML PO SYRP: 5.0000 mL | ORAL_SOLUTION | ORAL | Status: DC | PRN

## 2012-01-18 MED ORDER — ONDANSETRON HCL 4 MG/2ML IJ SOLN: 4.0000 mg | Freq: Four times a day (QID) | INTRAMUSCULAR | Status: DC | PRN

## 2012-01-18 MED ORDER — ONDANSETRON HCL 4 MG PO TABS: 4.0000 mg | ORAL_TABLET | Freq: Four times a day (QID) | ORAL | Status: DC | PRN

## 2012-01-18 MED ORDER — DILTIAZEM HCL 100 MG IV SOLR
5.0000 mg/h | Freq: Once | INTRAVENOUS | Status: AC
  Administered 2012-01-18: 5 mg/h via INTRAVENOUS

## 2012-01-18 MED ORDER — ALUM & MAG HYDROXIDE-SIMETH 200-200-20 MG/5ML PO SUSP: 30.0000 mL | Freq: Four times a day (QID) | ORAL | Status: DC | PRN

## 2012-01-18 MED ORDER — ALBUTEROL SULFATE (5 MG/ML) 0.5% IN NEBU: 2.5000 mg | INHALATION_SOLUTION | RESPIRATORY_TRACT | Status: DC | PRN

## 2012-01-18 MED ORDER — IPRATROPIUM BROMIDE 0.02 % IN SOLN
0.5000 mg | Freq: Once | RESPIRATORY_TRACT | Status: AC
  Administered 2012-01-18: 0.5 mg via RESPIRATORY_TRACT
  Filled 2012-01-18: qty 2.5

## 2012-01-18 MED ORDER — ASPIRIN 81 MG PO CHEW
324.0000 mg | CHEWABLE_TABLET | Freq: Once | ORAL | Status: AC
  Administered 2012-01-18: 324 mg via ORAL
  Filled 2012-01-18: qty 4

## 2012-01-18 MED ORDER — DILTIAZEM HCL 25 MG/5ML IV SOLN
20.0000 mg | Freq: Once | INTRAVENOUS | Status: AC
  Administered 2012-01-18: 20 mg via INTRAVENOUS

## 2012-01-18 MED ORDER — ZOLPIDEM TARTRATE 5 MG PO TABS: 5.0000 mg | ORAL_TABLET | Freq: Every evening | ORAL | Status: DC | PRN

## 2012-01-18 MED ORDER — METHYLPREDNISOLONE SODIUM SUCC 125 MG IJ SOLR
125.0000 mg | Freq: Once | INTRAMUSCULAR | Status: AC
  Administered 2012-01-18: 125 mg via INTRAVENOUS
  Filled 2012-01-18: qty 2

## 2012-01-18 NOTE — ED Notes (Signed)
Pt states she awoke from sleep this morning with SOB, was "drenched with sweat".  Used her albuterol inhaler at home with no relief.  HR initially 180 with wheezing in all lung fields.  Not able to speak in complete sentences, one word at a time.  Denies n/v, CP at this time.

## 2012-01-18 NOTE — Progress Notes (Addendum)
ANTICOAGULATION CONSULT NOTE - Initial Consult  Pharmacy Consult for Heparin Indication: atrial fibrillation  Allergies  Allergen Reactions  . Celecoxib     REACTION: rash    Patient Measurements: Height: 5\' 7"  (170.2 cm) Weight: 117 lb 11.6 oz (53.4 kg) IBW/kg (Calculated) : 61.6  Heparin Dosing Weight: 53.4kg  Vital Signs: Temp: 97.3 F (36.3 C) (05/28 0900) Temp src: Oral (05/28 0900) BP: 138/72 mmHg (05/28 0900) Pulse Rate: 82  (05/28 0900)  Labs:  Basename 01/18/12 0543 01/18/12 0532  HGB 15.6* 14.4  HCT 46.0 42.1  PLT -- 250  APTT -- 26  LABPROT -- 12.5  INR -- 0.91  HEPARINUNFRC -- --  CREATININE 0.80 --  CKTOTAL -- --  CKMB -- --  TROPONINI -- --    Estimated Creatinine Clearance: 60.7 ml/min (by C-G formula based on Cr of 0.8).   Medical History: Past Medical History  Diagnosis Date  . Asthma   . Hypertension   . GERD (gastroesophageal reflux disease)     Medications:  Prescriptions prior to admission  Medication Sig Dispense Refill  . albuterol (PROVENTIL HFA;VENTOLIN HFA) 108 (90 BASE) MCG/ACT inhaler Inhale 2 puffs into the lungs every 4 (four) hours as needed for wheezing.  1 each  0  . aspirin EC 81 MG tablet Take 1 tablet (81 mg total) by mouth daily.  30 tablet    . diltiazem (CARDIZEM) 60 MG tablet Take 1 tablet (60 mg total) by mouth every 12 (twelve) hours.  60 tablet  0  . Fluticasone-Salmeterol (ADVAIR) 100-50 MCG/DOSE AEPB Inhale 1 puff into the lungs 2 (two) times daily.  60 each  0  . tiotropium (SPIRIVA) 18 MCG inhalation capsule Place 18 mcg into inhaler and inhale daily.        Assessment: 61yof on heparin for new onset Afib with RVR. Heparin was started in the ED (~0640) with 5000 unit bolus and drip rate of 700 units/hr (~13 units/kg/hr). Patient takes no anticoagulants.  - Heparin weight: 53.4kg - H/H and Plts wnl - No significant bleeding reported - Baseline INR: 0.91  Goal of Therapy:  Heparin level 0.3-0.7  units/ml Monitor platelets by anticoagulation protocol: Yes   Plan:  1. Continue heparin drip 700 units/hr (7 ml/hr) 2. Check heparin level 6 hours after heparin drip started (~1300) 3. Daily heparin level and CBC  [name] [phone] 01/18/2012,9:44 AM  Addendum (3:18 PM) Pharmacy now consulted to start Coumadin for new onset Afib along with heparin. Initial heparin level (0.66) is therapeutic.  - Baseline INR: 0.91 - H/H and Plts wnl - Weight: 53kg - Warfarin points: 4   INR Goal: 2-3 Heparin level: 0.3-0.7  Plan:  1. Coumadin 5mg  po x 1 today 2. Daily PT/INR and heparin level 3. Coumadin educational book/video 4. Continue heparin 700 units/hr  [name], PharmD 01/18/2012

## 2012-01-18 NOTE — Consult Note (Signed)
Cardiology Consult Note   Patient ID: [name] MRN: [id number], DOB/AGE: [birth year]   Admit date: 01/18/2012 Date of Consult: 01/18/2012  Primary Physician: Dala Dock Primary Cardiologist: New to cardiology  Pt. Profile: [name] is a 61 yo female with PMHx significant for asthma, history of narrow-complex tachycardia, HTN and GERD who was admitted to [facility] on 01/18/12 with palpitations and shortness of breath.   Reason for consult: evaluation/management of narrow-complex tachycardia  Problem List: Past Medical History  Diagnosis Date  . Asthma   . Hypertension   . GERD (gastroesophageal reflux disease)     Past Surgical History  Procedure Date  . Tubal ligation      Allergies:  Allergies  Allergen Reactions  . Celecoxib     REACTION: rash   PAST CARDIAC HISTORY  2D ECHO 01/08/12: LVEF 50-55%, LV diastolic function normal, no evidence of elevated filling pressures, no WMAs, no valvular abnormalities. PA peak pressure 21 mmHg. Normal pulmonary artery pressures noted.   HPI:   She states that she was told on her prior discharge that when she uses her inhaler, it could cause her heart to beat fast. She states that she has not tolerated Cardizem endorsing lightheadedness, fatigue, nausea and dizziness.   She reports feeling well post-discharge from [facility] earlier this month. She did notice occasional tachy-palpitations intermittently, but was asymptomatic. Just prior to falling asleep last night, she endorses an increased frequency of these palpitations. She awoke early this morning, diaphoretic and short of breath. She employed the use of her rescue albuterol inhaler. She reports taking 5 puffs of albuterol. Spiriva and Advair were used as well. She remained short of breath with wheezing, and worsening tachy-palpitations. Her daughter transported her to the ED. She has not taken her Cardizem for the last two days due to intolerance. No increased caffeine use. No  illicit drug use. No tobacco or alcohol abuse.   Upon ED arrival, the patient was found to have a narrow-complex tachycardia believed to represent atrial fibrillation with RVR, SVT or MAT per the primary team. She was started on Cardizem gtt for rate-control and albuterol for wheezing. She soon after converted to NSR on Cardizem 10mg /hr. This was then tapered off. She takes Cardizem 60mg  PO BID outpatient. Her symptoms improved. CXR revealed emphysematous changes in the lungs. No evidence of active pulmonary disease. D-dimer WNL. POC TnI and first set of cardiac biomarkers normal. CBC unremarkable. BMET revealed a very mild hypernatremia, otherwise unremarkable. Recent TSH/T4 WNL.   Home Medications: Prior to Admission medications   Medication Sig Start Date End Date Taking? Authorizing Provider  albuterol (PROVENTIL HFA;VENTOLIN HFA) 108 (90 BASE) MCG/ACT inhaler Inhale 2 puffs into the lungs every 4 (four) hours as needed for wheezing. 01/06/12 01/05/13 Yes Glynn Octave, MD  aspirin EC 81 MG tablet Take 1 tablet (81 mg total) by mouth daily. 01/08/12 01/18/12 Yes Srikar Cherlynn Kaiser, MD  diltiazem (CARDIZEM) 60 MG tablet Take 1 tablet (60 mg total) by mouth every 12 (twelve) hours. 01/08/12 01/07/13 Yes Srikar Cherlynn Kaiser, MD  Fluticasone-Salmeterol (ADVAIR) 100-50 MCG/DOSE AEPB Inhale 1 puff into the lungs 2 (two) times daily. 01/08/12  Yes Cristal Ford, MD  tiotropium (SPIRIVA) 18 MCG inhalation capsule Place 18 mcg into inhaler and inhale daily.   Yes Historical Provider, MD   Inpatient Medications:     . sodium chloride   Intravenous STAT  . aspirin  324 mg Oral Once  . diltiazem  120 mg Oral Daily  .  diltiazem (CARDIZEM) infusion  5-15 mg/hr Intravenous Once  . diltiazem  20 mg Intravenous Once  . heparin  700 Units/hr Intravenous To Major  . heparin  5,000 Units Intravenous Once  . ipratropium  0.5 mg Nebulization Once  . ipratropium  0.5 mg Nebulization Q6H  . levalbuterol  0.63 mg  Nebulization Q6H  . methylPREDNISolone (SOLU-MEDROL) injection  125 mg Intravenous Once  . methylPREDNISolone (SOLU-MEDROL) injection  60 mg Intravenous Q12H  . senna  1 tablet Oral BID  . sodium chloride  3 mL Intravenous Q12H  . DISCONTD: albuterol  2.5 mg Nebulization Q4H  . DISCONTD: diltiazem (CARDIZEM) infusion  5-15 mg/hr Intravenous Once  . DISCONTD: diltiazem  20 mg Intravenous Once   Prescriptions prior to admission  Medication Sig Dispense Refill  . albuterol (PROVENTIL HFA;VENTOLIN HFA) 108 (90 BASE) MCG/ACT inhaler Inhale 2 puffs into the lungs every 4 (four) hours as needed for wheezing.  1 each  0  . aspirin EC 81 MG tablet Take 1 tablet (81 mg total) by mouth daily.  30 tablet    . diltiazem (CARDIZEM) 60 MG tablet Take 1 tablet (60 mg total) by mouth every 12 (twelve) hours.  60 tablet  0  . Fluticasone-Salmeterol (ADVAIR) 100-50 MCG/DOSE AEPB Inhale 1 puff into the lungs 2 (two) times daily.  60 each  0  . tiotropium (SPIRIVA) 18 MCG inhalation capsule Place 18 mcg into inhaler and inhale daily.        Family History  Problem Relation Age of Onset  . Cancer Mother   . Cancer Father   . Heart attack Mother 30     History   Social History  . Marital Status: Single    Spouse Name: N/A    Number of Children: N/A  . Years of Education: N/A   Occupational History  . Not on file.   Social History Main Topics  . Smoking status: Former Smoker    Quit date: 11/07/2011  . Smokeless tobacco: Never Used  . Alcohol Use: No  . Drug Use: Yes    Special: Marijuana     Smoked marijuana in the past, reports quitting 1.5 month ago. Smoked crack cocaine in the past. Quit 22 years ago.  Marland Kitchen Sexually Active: Yes    Birth Control/ Protection: Post-menopausal, Surgical   Other Topics Concern  . Not on file   Social History Narrative   Lives in [place], [place] with daughter.      Review of Systems: General: positive for lightheadedness, negative for chills, fever, night  sweats or weight changes.  Cardiovascular: positive for chest soreness, shortness of breath, palpitations, negative for dyspnea on exertion, edema, orthopnea, paroxysmal nocturnal dyspnea Dermatological: negative for rash Respiratory: positive for wheezing, negative for cough Urologic: negative for hematuria Abdominal: positive for nausea, negative for vomiting, diarrhea, bright red blood per rectum, melena, or hematemesis Neurologic: positive for dizziness, negative for visual changes, syncope All other systems reviewed and are otherwise negative except as noted above.  Physical Exam: Blood pressure 138/72, pulse 82, temperature 97.3 F (36.3 C), temperature source Oral, resp. rate 18, height 5\' 7"  (1.702 m), weight 53.4 kg (117 lb 11.6 oz), SpO2 98.00%.   General: Well developed, well nourished, in no acute distress. Head: Normocephalic, atraumatic, sclera non-icteric, no xanthomas, nares are without discharge.  Neck: Negative for carotid bruits. JVD not elevated. Lungs: Clear bilaterally to auscultation without wheezes, rales, or rhonchi. Breathing is unlabored. Heart: RRR with S1 S2. No murmurs, rubs, or  gallops appreciated. Abdomen: Soft, non-tender, non-distended with normoactive bowel sounds. No hepatomegaly. No rebound/guarding. No obvious abdominal masses. Msk:  Strength and tone appears normal for age. Extremities: No clubbing, cyanosis or edema.  Distal pedal pulses are 2+ and equal bilaterally. Neuro: Alert and oriented X 3. Moves all extremities spontaneously. Psych:  Responds to questions appropriately with a normal affect.  Labs: Recent Labs  Basename 01/18/12 0543 01/18/12 0532   WBC -- 7.5   HGB 15.6* 14.4   HCT 46.0 42.1   MCV -- 79.9   PLT -- 250   Recent Labs  Basename 01/18/12 0532   DDIMER 0.32    Lab 01/18/12 0543  NA 147*  K 4.6  CL 110  CO2 --  BUN 21  CREATININE 0.80  CALCIUM --  PROT --  BILITOT --  ALKPHOS --  ALT --  AST --  AMYLASE --    LIPASE --  GLUCOSE 104*   Radiology/Studies: Dg Chest 2 View  01/06/2012  *RADIOLOGY REPORT*  Clinical Data: Asthma, shortness of breath.  CHEST - 2 VIEW  Comparison: 03/24/2010  Findings: There is hyperinflation of the lungs compatible with COPD.  Heart and mediastinal contours are within normal limits.  No focal opacities or effusions.  No acute bony abnormality.  IMPRESSION: COPD.  No active disease.  Original Report Authenticated By: Cyndie Chime, M.D.   Dg Chest Port 1 View  01/18/2012  *RADIOLOGY REPORT*  Clinical Data: Acute onset shortness of breath.  PORTABLE CHEST - 1 VIEW  Comparison: 01/06/2012  Findings: Normal heart size and pulmonary vascularity. Emphysematous changes in the lungs.  No focal airspace consolidation.  No blunting of costophrenic angles.  No pneumothorax.  No significant change since previous study.  IMPRESSION: Emphysematous changes in the lungs.  No evidence of active pulmonary disease.  Original Report Authenticated By: Marlon Pel, M.D.    EKG:  01/18/12 0519: atrial fibrillation with RVR, rate 188 bpm, no ST-T wave changes 01/18/12 0549: course atrial fibrillation, 98 bpm, no ST-T wave changes 01/18/12 0722: NSR, 88 bpm, no ST-T wave changes  ASSESSMENT AND PLAN:   1. Atrial fibrillation with RVR 2. Asthma 3. HTN 4. COPD 5. GERD  DISCUSSION/PLAN:   Patient experienced an asthma flare this morning with attempt at relief with multiple albuterol treatments. Recent echo earlier this month indicates no structural changes, diastolic/systolic dysfunction, no WMAs, no valvular abnormalities. PA pressures normal as well. TSH normal. No caffeine/tobacco/EtOH/illicit drugs. Cardiac biomarkers normal thus far. Of note, patient had not taken Cardizem for two days due to side effect intolerance prior to admission. This along with multiple albuterol treatments likely exacerbated a-fib + RVR. This was successfully rate-controlled on Cardizem IV, subsequently  converted to and maintained NSR. Would avoid BB given asthma. This unfortunately limits our options. Long-acting diltiazem may be a better choice. Continue Cardizem CD as is. She is on heparin gtt now. CHADSVASc score of 2 (HTN, female). Would initiate Coumadin for cost purposes. Hopeful this can be followed at an upcoming [facility] appointment on Friday. Would recommend levalbuterol over albuterol.    Signed, R. Hurman Horn, PA-C 01/18/2012, 11:15 AM    I have taken a history, reviewed medications, allergies, PMH, SH, FH, and reviewed ROS and examined the patient.  I agree with the assessment and plan. Spent a considerable time discussing meds and compliance. Hopefully will tolerate once a day Diltiazem and switch to Xopenex if possible. Warfarin can be followed at [facility]. She has an appointment  on Friday the 31st.  Makaylee Spielberg C. Daleen Squibb, MD, [facility] Pager:  [phone]

## 2012-01-18 NOTE — ED Notes (Signed)
Pt is here with sob and asthma.  HR 191.

## 2012-01-18 NOTE — ED Notes (Signed)
RN attempted to call report to 2000.  Receiving RN will call back.

## 2012-01-18 NOTE — ED Provider Notes (Signed)
History     CSN: [id number]  Arrival date & time 01/18/12  0507   First MD Initiated Contact with Patient 01/18/12 [phone]      Chief Complaint  Patient presents with  . Shortness of Breath    (Consider location/radiation/quality/duration/timing/severity/associated sxs/prior treatment) HPI Comments: 61 year old female with a history of asthma, hypertension and gastroesophageal reflux disease. She also has a significant smoking history who quit approximately 2 months ago. According to the family members who brought her to the hospital including her daughter Marcelino Duster, yesterday she decided to start smoking again and stopped taking her Cardizem. According to the medical record the patient was started on Cardizem in the hospital last week when she was found to have some sinus tachycardia which was thought to be related to the increased bronchodilator use at home for her asthma. At that time she was admitted and treated for acute respiratory decompensation related to her reactive airway disease.  She states that her shortness of breath and chest pain started last night, it was associated with wheezing and so she gave herself 2 albuterol treatments. After that she felt like her heart was racing, she had chest pain and ongoing shortness of breath. She does not use home oxygen. Her symptoms are persistent, severe, not associated with fevers or coughing.  Patient is a 61 y.o. female presenting with shortness of breath. The history is provided by the patient and medical records. The history is limited by the condition of the patient (Severe respiratory distress).  Shortness of Breath  Associated symptoms include shortness of breath.    Past Medical History  Diagnosis Date  . Asthma   . Hypertension   . GERD (gastroesophageal reflux disease)     Past Surgical History  Procedure Date  . Tubal ligation     Family History  Problem Relation Age of Onset  . Cancer Mother   . Cancer Father      History  Substance Use Topics  . Smoking status: Former Smoker -- 30 years    Quit date: 11/07/2011  . Smokeless tobacco: Never Used  . Alcohol Use: No    OB History    Grav Para Term Preterm Abortions TAB SAB Ect Mult Living                  Review of Systems  Respiratory: Positive for shortness of breath.   All other systems reviewed and are negative.    Allergies  Celecoxib  Home Medications   Current Outpatient Rx  Name Route Sig Dispense Refill  . ALBUTEROL SULFATE HFA 108 (90 BASE) MCG/ACT IN AERS Inhalation Inhale 2 puffs into the lungs every 4 (four) hours as needed for wheezing. 1 each 0  . ASPIRIN EC 81 MG PO TBEC Oral Take 1 tablet (81 mg total) by mouth daily. 30 tablet   . DILTIAZEM HCL 60 MG PO TABS Oral Take 1 tablet (60 mg total) by mouth every 12 (twelve) hours. 60 tablet 0  . FLUTICASONE-SALMETEROL 100-50 MCG/DOSE IN AEPB Inhalation Inhale 1 puff into the lungs 2 (two) times daily. 60 each 0  . PREDNISONE 10 MG PO TABS  Take 40 mg po daily for 2 days, then 20 mg po daily for 2 days, then 10 mg po daily for 2 days, then 5 mg po daily for 2 days then discontinue. 16 tablet 0  . TIOTROPIUM BROMIDE MONOHYDRATE 18 MCG IN CAPS Inhalation Place 18 mcg into inhaler and inhale daily.  BP 149/128  Pulse 65  Temp(Src) 98.3 F (36.8 C) (Oral)  Resp 28  SpO2 99%  Physical Exam  Nursing note and vitals reviewed. Constitutional: She appears well-developed and well-nourished. She appears distressed ( Respiratory distress).  HENT:  Head: Normocephalic and atraumatic.  Mouth/Throat: Oropharynx is clear and moist. No oropharyngeal exudate.  Eyes: Conjunctivae and EOM are normal. Pupils are equal, round, and reactive to light. Right eye exhibits no discharge. Left eye exhibits no discharge. No scleral icterus.  Neck: Normal range of motion. Neck supple. No JVD present. No thyromegaly present.  Cardiovascular: Normal heart sounds and intact distal pulses.   Exam reveals no gallop and no friction rub.   No murmur heard.      Irregularly irregular rhythm, tachycardia to 180s  Pulmonary/Chest: She is in respiratory distress. She has wheezes. She has no rales.       Diffuse expiratory wheezing, increased work of breathing, prolonged expiratory phase, accessory muscle use, speaks in one to 2 word sentences  Abdominal: Soft. Bowel sounds are normal. She exhibits no distension and no mass. There is no tenderness.  Musculoskeletal: Normal range of motion. She exhibits no edema and no tenderness.  Lymphadenopathy:    She has no cervical adenopathy.  Neurological: She is alert. Coordination normal.  Skin: Skin is warm and dry. No rash noted. No erythema.  Psychiatric: She has a normal mood and affect. Her behavior is normal.    ED Course  Procedures (including critical care time)   Labs Reviewed  CBC  DIFFERENTIAL  APTT  PROTIME-INR   No results found.   No diagnosis found.    MDM  Ongoing respiratory difficulties, oxygen saturations are 96% on room air, 100% on supplemental nasal cannula. Pulse ranges between 150 and 190, EKG shows a atrial fibrillation with a rapid ventricular response, blood pressure stable she is afebrile. Will start treatment with Cardizem bolus and drip, heparin bolus and drip e regulation, chest x-ray EKG, anticipate admission to the hospital for ongoing care for reactive airway disease, respiratory distress and now a new irregular arrhythmia. She states she has never had trouble with an irregular rhythm in the past. The documentation and review of EKGs from the hospital from her prior admission show no signs of atrial fibrillation.  ED ECG REPORT   Date: 01/18/2012   Rate: 188  Rhythm: atrial fibrillation with rvr  QRS Axis: normal  Intervals: normal  ST/T Wave abnormalities: nonspecific ST/T changes  Conduction Disutrbances:none  Narrative Interpretation:   Old EKG Reviewed: Compared with an EKG from  01/07/2012, the patient was in sinus rhythm with frequent ectopy, now in atrial fibrillation with rapid ventricular rate.   Patient reevaluated after Cardizem, slight slowing of the rate, requiring a Cardizem drip for rate control below 110. Heparin drip started for anticoagulation given her irregular rhythm and need for ongoing bronchodilator therapy. Laboratory results reviewed and show that she has a white blood cell count of 7500, no anemia, normal chemistry other than slight hypernatremia and a chest x-ray showing no signs of consolidation or pneumothorax. I have discussed her care with the [facility] hospitalist who will admit her to a telemetry bed for ongoing treatment of her rapid atrial fibrillation and reactive airway disease. Currently she is on a Cardizem drip, heparin drip and continuous albuterol nebulizer therapy.  6:45 AM, patient reevaluated and has improved lung symptoms, speaking in truncated sentences which is an improvement for her. Solu-Medrol given  CRITICAL CARE Performed by: Vida Roller  Total critical care time: 35  Critical care time was exclusive of separately billable procedures and treating other patients.  Critical care was necessary to treat or prevent imminent or life-threatening deterioration.  Critical care was time spent personally by me on the following activities: development of treatment plan with patient and/or surrogate as well as nursing, discussions with consultants, evaluation of patient's response to treatment, examination of patient, obtaining history from patient or surrogate, ordering and performing treatments and interventions, ordering and review of laboratory studies, ordering and review of radiographic studies, pulse oximetry and re-evaluation of patient's condition.       Vida Roller, MD 01/18/12 [phone]

## 2012-01-18 NOTE — ED Notes (Signed)
Report called to Clydie Braun on 2000.

## 2012-01-18 NOTE — ED Notes (Signed)
MD at bedside. 

## 2012-01-18 NOTE — H&P (Signed)
PATIENT DETAILS Name: [name] Age: 61 y.o. Sex: female Date of Birth: 09/20/1950 Admit Date: 01/18/2012 WUJ:[email or web address],WGNFAOZH, MD, MD   CHIEF COMPLAINT:  Diaphoresis, Palpitation and SOB-upon waking up at 4 am today  HPI: Patient is a 60 year old African American female with a past medical history of bronchial asthma, hypertension, gastroesophageal reflux disease, history of narrow complex tachycardia during her last admit a few days ago, who was just actually discharged from [facility] for a similar admission 5/18 2013. This patient returns to the ED with the above-noted complaints. Patient claims that upon discharge she was feeling much better, she felt she was back to her baseline. However yesterday evening prior to going to bed, she did feel some amount of palpitations. She thought she could just sleep the through it. However this morning around 4:00 she woke up drenched with sweat, she found herself for wheezing and and also with palpitations. She was brought to the ED where a heart rate was initially in the 180s to 190s. Rhythm was a narrow complex tachycardia, she was then placed on a Cardizem infusion and was given a hour long nebulizer with albuterol. During my evaluation, she was on a Cardizem infusion at 10 mg an hour, her rhythm had converted to sinus, I then tapered off her Cardizem infusion. She is currently with sinus rhythm with heart rate in the 80s. She also feels symptomatically better. She denies any chest pain, she denies any headaches, she denies any abdominal pain, denies any nausea, denies any vomiting or diarrhea.  ALLERGIES:   Allergies  Allergen Reactions  . Celecoxib     REACTION: rash    PAST MEDICAL HISTORY: Past Medical History  Diagnosis Date  . Asthma   . Hypertension   . GERD (gastroesophageal reflux disease)     PAST SURGICAL HISTORY: Past Surgical History  Procedure Date  . Tubal ligation     MEDICATIONS AT HOME: Prior to Admission  medications   Medication Sig Start Date End Date Taking? Authorizing Provider  albuterol (PROVENTIL HFA;VENTOLIN HFA) 108 (90 BASE) MCG/ACT inhaler Inhale 2 puffs into the lungs every 4 (four) hours as needed for wheezing. 01/06/12 01/05/13 Yes Glynn Octave, MD  aspirin EC 81 MG tablet Take 1 tablet (81 mg total) by mouth daily. 01/08/12 01/18/12 Yes Srikar Cherlynn Kaiser, MD  diltiazem (CARDIZEM) 60 MG tablet Take 1 tablet (60 mg total) by mouth every 12 (twelve) hours. 01/08/12 01/07/13 Yes Srikar Cherlynn Kaiser, MD  Fluticasone-Salmeterol (ADVAIR) 100-50 MCG/DOSE AEPB Inhale 1 puff into the lungs 2 (two) times daily. 01/08/12  Yes Cristal Ford, MD  tiotropium (SPIRIVA) 18 MCG inhalation capsule Place 18 mcg into inhaler and inhale daily.   Yes Historical Provider, MD    FAMILY HISTORY: Family History  Problem Relation Age of Onset  . Cancer Mother   . Cancer Father     SOCIAL HISTORY:  reports that she quit smoking about 2 months ago. She has never used smokeless tobacco. She reports that she uses illicit drugs (Marijuana). She reports that she does not drink alcohol.  REVIEW OF SYSTEMS:  Constitutional:   No  weight loss, night sweats,  Fevers, chills, fatigue.  HEENT:    No headaches, Difficulty swallowing,Tooth/dental problems,Sore throat,  No sneezing, itching, ear ache, nasal congestion, post nasal drip,   Cardio-vascular: No chest pain,  Orthopnea, PND, swelling in lower extremities, anasarca,  dizziness GI:  No heartburn, indigestion, abdominal pain, nausea, vomiting, diarrhea, change in bowel habits, loss of appetite  Resp: No shortness of breath with exertion or at rest.  No excess mucus, no productive cough, No non-productive cough,  No coughing up of blood.No change in color of mucus.No chest wall deformity  Skin:  no rash or lesions.  GU:  no dysuria, change in color of urine, no urgency or frequency.  No flank pain.  Musculoskeletal: No joint pain or swelling.  No decreased  range of motion.  No back pain.  Psych: No change in mood or affect. No depression or anxiety.  No memory loss.   PHYSICAL EXAM: Blood pressure 125/76, pulse 88, temperature 98.3 F (36.8 C), temperature source Oral, resp. rate 24, SpO2 96.00%.  General appearance :Awake, alert, not in any distress. Speech Clear. Not toxic Looking HEENT: Atraumatic and Normocephalic, pupils equally reactive to light and accomodation Neck: supple, no JVD. No cervical lymphadenopathy.  Chest:Good air entry bilaterally, only scattered rhonchi currently.  CVS: S1 S2 regular, no murmurs.  Abdomen: Bowel sounds present, Non tender and not distended with no gaurding, rigidity or rebound. Extremities: B/L Lower Ext shows no edema, both legs are warm to touch, with  dorsalis pedis pulses palpable. Neurology: Awake alert, and oriented X 3, CN II-XII intact, Non focal, Deep Tendon Reflex-2+ all over, plantar's downgoing B/L, sensory exam is grossly intact.  Skin:No Rash Wounds:N/A  LABS ON ADMISSION:   Basename 01/18/12 0543  NA 147*  K 4.6  CL 110  CO2 --  GLUCOSE 104*  BUN 21  CREATININE 0.80  CALCIUM --  MG --  PHOS --   No results found for this basename: AST:2,ALT:2,ALKPHOS:2,BILITOT:2,PROT:2,ALBUMIN:2 in the last 72 hours No results found for this basename: LIPASE:2,AMYLASE:2 in the last 72 hours  Basename 01/18/12 0543 01/18/12 0532  WBC -- 7.5  NEUTROABS -- 3.2  HGB 15.6* 14.4  HCT 46.0 42.1  MCV -- 79.9  PLT -- 250   No results found for this basename: CKTOTAL:3,CKMB:3,CKMBINDEX:3,TROPONINI:3 in the last 72 hours No results found for this basename: DDIMER:2 in the last 72 hours No components found with this basename: POCBNP:3   RADIOLOGIC STUDIES ON ADMISSION: Dg Chest 2 View  01/06/2012  *RADIOLOGY REPORT*  Clinical Data: Asthma, shortness of breath.  CHEST - 2 VIEW  Comparison: 03/24/2010  Findings: There is hyperinflation of the lungs compatible with COPD.  Heart and mediastinal  contours are within normal limits.  No focal opacities or effusions.  No acute bony abnormality.  IMPRESSION: COPD.  No active disease.  Original Report Authenticated By: Cyndie Chime, M.D.   Dg Chest Port 1 View  01/18/2012  *RADIOLOGY REPORT*  Clinical Data: Acute onset shortness of breath.  PORTABLE CHEST - 1 VIEW  Comparison: 01/06/2012  Findings: Normal heart size and pulmonary vascularity. Emphysematous changes in the lungs.  No focal airspace consolidation.  No blunting of costophrenic angles.  No pneumothorax.  No significant change since previous study.  IMPRESSION: Emphysematous changes in the lungs.  No evidence of active pulmonary disease.  Original Report Authenticated By: Marlon Pel, M.D.    ASSESSMENT AND PLAN: Present on Admission:  . Narrow complex tachycardia  -Monitor in telemetry as she is off Cardizem infusion -To her whether this is a SVT/atrial fibrillation with rapid ventricular response or MAT. -Since she has converted to sinus rhythm, will stop her Cardizem infusion and placed on oral Cardizem for now. -We will continue her heparin drip for now  -We will consult cardiology for assistance in determining rhythm and further course of management. Avoid  beta blockers for now given bronchospasm.  -Will check d-dimer, if elevated would proceed with a CT angiogram of the chest.  .Asthmatic bronchitis with exacerbation -We'll continue with nebulized bronchodilators, continue with Solu-Medrol.   Marland KitchenHYPERTENSION -Continue with Cardizem.  Further plan will depend as patient's clinical course evolves and further radiologic and laboratory data become available. Patient will be monitored closely.  DVT Prophylaxis: -on  heparin infusion  Code Status: -Full code   Total time spent for admission equals 45 minutes.  Jeoffrey Massed 01/18/2012, 7:42 AM

## 2012-01-19 DIAGNOSIS — J45901 Unspecified asthma with (acute) exacerbation: Secondary | ICD-10-CM

## 2012-01-19 DIAGNOSIS — I10 Essential (primary) hypertension: Secondary | ICD-10-CM

## 2012-01-19 DIAGNOSIS — I4891 Unspecified atrial fibrillation: Secondary | ICD-10-CM

## 2012-01-19 LAB — CBC
Hemoglobin: 11.3 g/dL — ABNORMAL LOW (ref 12.0–15.0)
MCH: 26.7 pg (ref 26.0–34.0)
MCHC: 33.4 g/dL (ref 30.0–36.0)
RDW: 16.5 % — ABNORMAL HIGH (ref 11.5–15.5)

## 2012-01-19 LAB — PROTIME-INR: Prothrombin Time: 14.3 seconds (ref 11.6–15.2)

## 2012-01-19 LAB — BASIC METABOLIC PANEL
BUN: 11 mg/dL (ref 6–23)
Calcium: 9.6 mg/dL (ref 8.4–10.5)
Chloride: 107 mEq/L (ref 96–112)
Creatinine, Ser: 0.57 mg/dL (ref 0.50–1.10)
GFR calc Af Amer: >90 mL/min (ref >90)
GFR calc non Af Amer: >90 mL/min (ref >90)

## 2012-01-19 LAB — CARDIAC PANEL(CRET KIN+CKTOT+MB+TROPI)
CK, MB: 2.4 ng/mL (ref 0.3–4.0)
Relative Index: INVALID (ref 0.0–2.5)
Total CK: 53 U/L (ref 7–177)
Troponin I: <0.3 ng/mL (ref <0.30)

## 2012-01-19 LAB — HEPARIN LEVEL (UNFRACTIONATED): Heparin Unfractionated: 0.41 IU/mL (ref 0.30–0.70)

## 2012-01-19 MED ORDER — ASPIRIN EC 325 MG PO TBEC: 325.0000 mg | DELAYED_RELEASE_TABLET | Freq: Every day | ORAL | Status: AC

## 2012-01-19 MED ORDER — WARFARIN SODIUM 5 MG PO TABS
5.0000 mg | ORAL_TABLET | Freq: Once | ORAL | Status: DC
Start: 2012-01-19 — End: 2012-01-19
  Filled 2012-01-19: qty 1

## 2012-01-19 NOTE — Progress Notes (Signed)
ANTICOAGULATION CONSULT NOTE - Follow Up Consult  Pharmacy Consult for Heparin and Coumadin Indication: atrial fibrillation  Allergies  Allergen Reactions  . Celecoxib     REACTION: rash    Patient Measurements: Height: 5\' 7"  (170.2 cm) Weight: 117 lb 11.6 oz (53.4 kg) IBW/kg (Calculated) : 61.6  Heparin Dosing Weight: 53.4kg  Vital Signs: Temp: 98.6 F (37 C) (05/29 0534) Temp src: Oral (05/29 0534) BP: 121/73 mmHg (05/29 0534) Pulse Rate: 85  (05/29 0534)  Labs:  [name] 01/19/12 0610 01/19/12 0035 01/18/12 1632 01/18/12 1300 01/18/12 0912 01/18/12 0543 01/18/12 0532  HGB 11.3* -- -- -- -- 15.6* --  HCT 33.8* -- -- -- -- 46.0 42.1  PLT 210 -- -- -- -- -- 250  APTT -- -- -- -- -- -- 26  LABPROT 14.3 -- -- -- -- -- 12.5  INR 1.09 -- -- -- -- -- 0.91  HEPARINUNFRC 0.41 -- -- 0.66 -- -- --  CREATININE 0.57 -- -- -- -- 0.80 --  CKTOTAL -- 53 53 -- 64 -- --  CKMB -- 2.4 2.7 -- 2.8 -- --  TROPONINI -- <0.30 <0.30 -- <0.30 -- --    Estimated Creatinine Clearance: 60.7 ml/min (by C-G formula based on Cr of 0.57).   Medications:  Heparin 700 units/hr  Assessment: 61yof on heparin bridging to Coumadin for new onset Afib with RVR. Heparin level (0.41) is therapeutic. INR (1.09) is subtherapeutic as expected after Coumadin 5mg  x 1 dose.  - H/H and Plts decreased - No significant bleeding reported  Goal of Therapy:  INR 2-3 Heparin level 0.3-0.7 units/ml Monitor platelets by anticoagulation protocol: Yes   Plan:  1. Continue heparin drip 700 units/hr (7 ml/hr) 2. Repeat Coumadin 5mg  po x 1 today 3. Follow-up AM INR and heparin level  [name] [phone] 01/19/2012,10:28 AM

## 2012-01-19 NOTE — Care Management Note (Signed)
    Page 1 of 1   01/19/2012     11:58:10 AM   CARE MANAGEMENT NOTE 01/19/2012  Patient:  [name], [name]   Account Number:  [id number]  Date Initiated:  01/18/2012  Documentation initiated by:  Avie Arenas  Subjective/Objective Assessment:   Admitted with Afib - on cardizem drip.  Daughter lives with her.     Action/Plan:   Anticipated DC Date:  01/21/2012   Anticipated DC Plan:  HOME/SELF CARE      DC Planning Services  CM consult      Choice offered to / List presented to:             Status of service:  In process, will continue to follow Medicare Important Message given?   (If response is "NO", the following Medicare IM given date fields will be blank) Date Medicare IM given:   Date Additional Medicare IM given:    Discharge Disposition:    Per UR Regulation:  Reviewed for med. necessity/level of care/duration of stay  If discussed at Long Length of Stay Meetings, dates discussed:    Comments:  01-19-12 11:50am Avie Arenas, RNBSN [phone] Talked with physician who would like to send patient home on coumadin.  Has appt with HS for the 31  appointment already set up, but wants to make sure will do PT/INR. Attempted to contact HS but states HS is closed.  Unable to contact.  Notified Johney Frame - community laison - left message - awaiting call back.  Dr. Mahala Menghini updated. Just wants to send patient home on asa and have HS followup.

## 2012-01-19 NOTE — Discharge Summary (Signed)
[facility] Discharge Summary  Date of Admission: 01/18/2012  5:11 AM Admitter: @ADMITPROV @   Date of Discharge5/29/2013 Attending Physician: Rhetta Mura, MD  Things to Follow-up on: Please follow her in to be sure patient can be started on Coumadin safely-she has no way of access  2 transportation and will need an INR at least weekly  Patient continue other medications as per prior      61  Yr old and AA female with known history of asthma, hypertension, GERD, history narcotics tachycardia recently discharged 5/18 for COPD exacerbation admitted 5/28-she thousand palpitations the night prior to admission and then woke up 4 AM on day of admission with wheezing and palpitations and took albuterol couple of times. She is then given hour-long nebulization of albuterol and was placed on Cardizem drip as she was had a heart rate in the 180s to 190s. Subsequently she converted to normal sinus rhythm and Cardizem drip was discontinued she was placed on by mouth Cardizem Cardiology was consulted and recommended patient being on Coumadin long-term. Patient has poor material resources and has needed help with medications in the past. She says she's not able to get to and from the cardiology office. An attempt was made to contact helps her physicians to determine who followup for INR and this proved futile. In the interim I suggested to her the alternative would be to take aspirin 325 mg which is probably the most affordable medication she can take. She once again would not be able to benefit from any of the more extensive antiplatelet agents which not need monitoring. Patient was discharged home in stable state and was to follow up closely with her [facility] physician as she might benefit from coumadin if she can get INR monitoring.   Procedures Performed and pertinent labs: Dg Chest 2 View  01/06/2012  *RADIOLOGY REPORT*  Clinical Data: Asthma, shortness of breath.  CHEST - 2 VIEW   Comparison: 03/24/2010  Findings: There is hyperinflation of the lungs compatible with COPD.  Heart and mediastinal contours are within normal limits.  No focal opacities or effusions.  No acute bony abnormality.  IMPRESSION: COPD.  No active disease.  Original Report Authenticated By: Cyndie Chime, M.D.   Dg Chest Port 1 View  01/18/2012  *RADIOLOGY REPORT*  Clinical Data: Acute onset shortness of breath.  PORTABLE CHEST - 1 VIEW  Comparison: 01/06/2012  Findings: Normal heart size and pulmonary vascularity. Emphysematous changes in the lungs.  No focal airspace consolidation.  No blunting of costophrenic angles.  No pneumothorax.  No significant change since previous study.  IMPRESSION: Emphysematous changes in the lungs.  No evidence of active pulmonary disease.  Original Report Authenticated By: Marlon Pel, M.D.    Discharge Vitals & PE:  BP 121/73  Pulse 85  Temp(Src) 98.6 F (37 C) (Oral)  Resp 18  Ht 5\' 7"  (1.702 m)  Wt 53.4 kg (117 lb 11.6 oz)  BMI 18.44 kg/m2  SpO2 97% Alert oriented in no apparent distress Chest clear to clear no added sound Abdomen soft nontender nondistended Lower extremities no swelling Neurologically intact  Discharge Labs:  Results for orders placed during the hospital encounter of 01/18/12 (from the past 24 hour(s))  HEPARIN LEVEL (UNFRACTIONATED)     Status: Normal   Collection Time   01/18/12  1:00 PM      Component Value Range   Heparin Unfractionated 0.66  0.30 - 0.70 (IU/mL)  CARDIAC PANEL(CRET KIN+CKTOT+MB+TROPI)  Status: Normal   Collection Time   01/18/12  4:32 PM      Component Value Range   Total CK 53  7 - 177 (U/L)   CK, MB 2.7  0.3 - 4.0 (ng/mL)   Troponin I <0.30  <0.30 (ng/mL)   Relative Index RELATIVE INDEX IS INVALID  0.0 - 2.5   CARDIAC PANEL(CRET KIN+CKTOT+MB+TROPI)     Status: Normal   Collection Time   01/19/12 12:35 AM      Component Value Range   Total CK 53  7 - 177 (U/L)   CK, MB 2.4  0.3 - 4.0 (ng/mL)    Troponin I <0.30  <0.30 (ng/mL)   Relative Index RELATIVE INDEX IS INVALID  0.0 - 2.5   BASIC METABOLIC PANEL     Status: Abnormal   Collection Time   01/19/12  6:10 AM      Component Value Range   Sodium 141  135 - 145 (mEq/L)   Potassium 3.3 (*) 3.5 - 5.1 (mEq/L)   Chloride 107  96 - 112 (mEq/L)   CO2 21  19 - 32 (mEq/L)   Glucose, Bld 138 (*) 70 - 99 (mg/dL)   BUN 11  6 - 23 (mg/dL)   Creatinine, Ser 1.61  0.50 - 1.10 (mg/dL)   Calcium 9.6  8.4 - 09.6 (mg/dL)   GFR calc non Af Amer >90  >90 (mL/min)   GFR calc Af Amer >90  >90 (mL/min)  HEPARIN LEVEL (UNFRACTIONATED)     Status: Normal   Collection Time   01/19/12  6:10 AM      Component Value Range   Heparin Unfractionated 0.41  0.30 - 0.70 (IU/mL)  CBC     Status: Abnormal   Collection Time   01/19/12  6:10 AM      Component Value Range   WBC 9.8  4.0 - 10.5 (K/uL)   RBC 4.23  3.87 - 5.11 (MIL/uL)   Hemoglobin 11.3 (*) 12.0 - 15.0 (g/dL)   HCT 04.5 (*) 40.9 - 46.0 (%)   MCV 79.9  78.0 - 100.0 (fL)   MCH 26.7  26.0 - 34.0 (pg)   MCHC 33.4  30.0 - 36.0 (g/dL)   RDW 81.1 (*) 91.4 - 15.5 (%)   Platelets 210  150 - 400 (K/uL)  PROTIME-INR     Status: Normal   Collection Time   01/19/12  6:10 AM      Component Value Range   Prothrombin Time 14.3  11.6 - 15.2 (seconds)   INR 1.09  0.00 - 1.49     Disposition and follow-up:   Ms.Leah Kovich was discharged from in good condition.    Follow-up Appointments:    Discharge Medications: Medication List  As of 01/19/2012 12:10 PM   TAKE these medications         albuterol 108 (90 BASE) MCG/ACT inhaler   Commonly known as: PROVENTIL HFA;VENTOLIN HFA   Inhale 2 puffs into the lungs every 4 (four) hours as needed for wheezing.      aspirin EC 325 MG tablet   Take 1 tablet (325 mg total) by mouth daily.      diltiazem 60 MG tablet   Commonly known as: CARDIZEM   Take 1 tablet (60 mg total) by mouth every 12 (twelve) hours.      Fluticasone-Salmeterol 100-50 MCG/DOSE Aepb    Commonly known as: ADVAIR   Inhale 1 puff into the lungs 2 (two) times daily.  tiotropium 18 MCG inhalation capsule   Commonly known as: SPIRIVA   Place 18 mcg into inhaler and inhale daily.           Medications Discontinued During This Encounter  Medication Reason  . diltiazem (CARDIZEM) injection 20 mg   . predniSONE (DELTASONE) 10 MG tablet Completed Course  . albuterol (PROVENTIL,VENTOLIN) solution continuous neb   . ondansetron (ZOFRAN) injection 4 mg Duplicate  . albuterol (PROVENTIL) (5 MG/ML) 0.5% nebulizer solution 2.5 mg Duplicate  . 0.9 %  sodium chloride infusion Duplicate  . diltiazem (CARDIZEM) 100 mg in dextrose 5 % 100 mL infusion Completed Course      > 40 minutes time spent preparing d/c summary, including direct face-face patient Time, contact with consultants, family and care coordination   Signed: Rhetta Mura 01/19/2012, 12:10 PM

## 2012-02-06 ENCOUNTER — Encounter (HOSPITAL_COMMUNITY): Payer: Self-pay | Admitting: Family Medicine

## 2012-02-06 ENCOUNTER — Emergency Department (HOSPITAL_COMMUNITY)
Admission: EM | Admit: 2012-02-06 | Discharge: 2012-02-06 | Disposition: A | Discharge status: Home / Self Care | Payer: Self-pay | Attending: Emergency Medicine | Admitting: Emergency Medicine

## 2012-02-06 ENCOUNTER — Emergency Department (HOSPITAL_COMMUNITY): Payer: Self-pay

## 2012-02-06 DIAGNOSIS — I10 Essential (primary) hypertension: Secondary | ICD-10-CM | POA: Insufficient documentation

## 2012-02-06 DIAGNOSIS — K219 Gastro-esophageal reflux disease without esophagitis: Secondary | ICD-10-CM | POA: Insufficient documentation

## 2012-02-06 DIAGNOSIS — R0602 Shortness of breath: Secondary | ICD-10-CM | POA: Insufficient documentation

## 2012-02-06 DIAGNOSIS — R0789 Other chest pain: Secondary | ICD-10-CM | POA: Insufficient documentation

## 2012-02-06 DIAGNOSIS — J45901 Unspecified asthma with (acute) exacerbation: Secondary | ICD-10-CM | POA: Insufficient documentation

## 2012-02-06 DIAGNOSIS — Z79899 Other long term (current) drug therapy: Secondary | ICD-10-CM | POA: Insufficient documentation

## 2012-02-06 LAB — CBC
HCT: 38.4 % (ref 36.0–46.0)
Hemoglobin: 13.1 g/dL (ref 12.0–15.0)
MCH: 27.2 pg (ref 26.0–34.0)
MCHC: 34.1 g/dL (ref 30.0–36.0)
MCV: 79.8 fL (ref 78.0–100.0)
RDW: 15.8 % — ABNORMAL HIGH (ref 11.5–15.5)

## 2012-02-06 LAB — URINALYSIS, ROUTINE W REFLEX MICROSCOPIC
Bilirubin Urine: NEGATIVE
Ketones, ur: NEGATIVE mg/dL
Nitrite: NEGATIVE
Protein, ur: NEGATIVE mg/dL
Urobilinogen, UA: 0.2 mg/dL (ref 0.0–1.0)

## 2012-02-06 LAB — DIFFERENTIAL
Basophils Absolute: 0 10*3/uL (ref 0.0–0.1)
Basophils Relative: 1 % (ref 0–1)
Eosinophils Absolute: 0.3 10*3/uL (ref 0.0–0.7)
Eosinophils Relative: 7 % — ABNORMAL HIGH (ref 0–5)
Monocytes Absolute: 0.3 10*3/uL (ref 0.1–1.0)
Monocytes Relative: 8 % (ref 3–12)
Neutro Abs: 1.7 10*3/uL (ref 1.7–7.7)

## 2012-02-06 LAB — URINE MICROSCOPIC-ADD ON: Urine-Other: NONE SEEN

## 2012-02-06 LAB — TROPONIN I: Troponin I: <0.3 ng/mL (ref <0.30)

## 2012-02-06 LAB — POCT I-STAT, CHEM 8
BUN: 13 mg/dL (ref 6–23)
Calcium, Ion: 1.28 mmol/L (ref 1.12–1.32)
Chloride: 105 mEq/L (ref 96–112)
Creatinine, Ser: 0.8 mg/dL (ref 0.50–1.10)
TCO2: 27 mmol/L (ref 0–100)

## 2012-02-06 MED ORDER — IPRATROPIUM BROMIDE 0.02 % IN SOLN
0.5000 mg | Freq: Once | RESPIRATORY_TRACT | Status: AC
  Administered 2012-02-06: 0.5 mg via RESPIRATORY_TRACT
  Filled 2012-02-06: qty 2.5

## 2012-02-06 MED ORDER — PREDNISONE 20 MG PO TABS: 60.0000 mg | ORAL_TABLET | Freq: Every day | ORAL | Status: AC

## 2012-02-06 MED ORDER — ONDANSETRON HCL 4 MG/2ML IJ SOLN: 4.0000 mg | Freq: Once | INTRAMUSCULAR | Status: DC

## 2012-02-06 MED ORDER — PREDNISONE 20 MG PO TABS
60.0000 mg | ORAL_TABLET | Freq: Once | ORAL | Status: AC
  Administered 2012-02-06: 60 mg via ORAL
  Filled 2012-02-06: qty 3

## 2012-02-06 MED ORDER — ALBUTEROL SULFATE (5 MG/ML) 0.5% IN NEBU
5.0000 mg | INHALATION_SOLUTION | Freq: Once | RESPIRATORY_TRACT | Status: AC
  Administered 2012-02-06: 5 mg via RESPIRATORY_TRACT
  Filled 2012-02-06: qty 0.5

## 2012-02-06 MED ORDER — ALBUTEROL SULFATE (5 MG/ML) 0.5% IN NEBU
5.0000 mg | INHALATION_SOLUTION | Freq: Once | RESPIRATORY_TRACT | Status: AC
  Administered 2012-02-06: 5 mg via RESPIRATORY_TRACT
  Filled 2012-02-06: qty 1

## 2012-02-06 NOTE — ED Provider Notes (Signed)
History     CSN: [id number]  Arrival date & time 02/06/12  0820   First MD Initiated Contact with Patient 02/06/12 [phone]      Chief Complaint  Patient presents with  . Shortness of Breath  . Urinary Frequency    (Consider location/radiation/quality/duration/timing/severity/associated sxs/prior treatment) The history is provided by the patient.   patient states that she developed the shortness of breath last night. She states she's had trouble sleeping do to it. She states she feels worse when she lays down. No relief with her albuterol at home. No fevers. She's had occasional cough. She has a history of both asthma and recent A. fib with RVR. She's also had some urinary frequency. No abdominal pain. Some mild chest pain. She states the shortness of breath is worse walking around.  Past Medical History  Diagnosis Date  . Asthma   . Hypertension   . GERD (gastroesophageal reflux disease)     Past Surgical History  Procedure Date  . Tubal ligation     Family History  Problem Relation Age of Onset  . Cancer Mother   . Cancer Father   . Heart attack Mother 79    History  Substance Use Topics  . Smoking status: Former Smoker    Quit date: 11/07/2011  . Smokeless tobacco: Never Used  . Alcohol Use: No    OB History    Grav Para Term Preterm Abortions TAB SAB Ect Mult Living                  Review of Systems  Constitutional: Negative for activity change and appetite change.  HENT: Negative for neck stiffness.   Eyes: Negative for pain.  Respiratory: Positive for chest tightness and shortness of breath.   Cardiovascular: Negative for chest pain and leg swelling.  Gastrointestinal: Negative for nausea, vomiting, abdominal pain and diarrhea.  Genitourinary: Negative for flank pain.  Musculoskeletal: Negative for back pain.  Skin: Negative for rash.  Neurological: Negative for weakness, numbness and headaches.  Psychiatric/Behavioral: Negative for behavioral  problems.    Allergies  Celecoxib  Home Medications   Current Outpatient Rx  Name Route Sig Dispense Refill  . ALBUTEROL SULFATE HFA 108 (90 BASE) MCG/ACT IN AERS Inhalation Inhale 2 puffs into the lungs every 4 (four) hours as needed for wheezing. 1 each 0  . DILTIAZEM HCL ER BEADS 180 MG PO CP24 Oral Take 180 mg by mouth daily.    Marland Kitchen FLUTICASONE-SALMETEROL 100-50 MCG/DOSE IN AEPB Inhalation Inhale 1 puff into the lungs 2 (two) times daily. 60 each 0  . TIOTROPIUM BROMIDE MONOHYDRATE 18 MCG IN CAPS Inhalation Place 18 mcg into inhaler and inhale daily.    Marland Kitchen PREDNISONE 20 MG PO TABS Oral Take 3 tablets (60 mg total) by mouth daily. 9 tablet 0    BP 138/94  Pulse 80  Temp 97.7 F (36.5 C) (Oral)  Resp 16  SpO2 92%  Physical Exam  Nursing note and vitals reviewed. Constitutional: She is oriented to person, place, and time. She appears well-developed and well-nourished.  HENT:  Head: Normocephalic and atraumatic.  Eyes: EOM are normal. Pupils are equal, round, and reactive to light.  Neck: Normal range of motion. Neck supple.  Cardiovascular: Normal rate, regular rhythm and normal heart sounds.   No murmur heard. Pulmonary/Chest: She is in respiratory distress. She has wheezes. She has no rales.       Mild respiratory distress. Patient has decreased air movement and has wheezes  in inspiration and expiration. No Rales or rhonchi  Abdominal: Soft. Bowel sounds are normal. She exhibits no distension. There is no tenderness. There is no rebound and no guarding.  Musculoskeletal: Normal range of motion.  Neurological: She is alert and oriented to person, place, and time. No cranial nerve deficit.  Skin: Skin is warm and dry.  Psychiatric: She has a normal mood and affect. Her speech is normal.    ED Course  Procedures (including critical care time)  Labs Reviewed  CBC - Abnormal; Notable for the following:    WBC 3.8 (*)     RDW 15.8 (*)     All other components within normal  limits  DIFFERENTIAL - Abnormal; Notable for the following:    Eosinophils Relative 7 (*)     All other components within normal limits  URINALYSIS, ROUTINE W REFLEX MICROSCOPIC - Abnormal; Notable for the following:    Hgb urine dipstick TRACE (*)     All other components within normal limits  PRO B NATRIURETIC PEPTIDE  TROPONIN I  POCT I-STAT, CHEM 8  URINE MICROSCOPIC-ADD ON   Dg Chest 2 View  02/06/2012  *RADIOLOGY REPORT*  Clinical Data: Shortness of breath  CHEST - 2 VIEW  Comparison: 01/18/2012  Findings: Hyperinflation may be seen with the provided history of asthma versus COPD. Cardiomediastinal silhouette is within normal limits. The lungs are clear. No pleural effusion.  No pneumothorax. No acute osseous abnormality.  IMPRESSION: No acute process.  Emphysematous changes/hyperinflation again noted.  Original Report Authenticated By: Harrel Lemon, M.D.     1. Asthmatic bronchitis with exacerbation      Date: 02/06/2012  Rate: 100  Rhythm: normal sinus rhythm and premature atrial contractions (PAC)  QRS Axis: normal  Intervals: normal  ST/T Wave abnormalities: normal  Conduction Disutrbances:none  Narrative Interpretation:   Old EKG Reviewed: unchanged    MDM  Patient with shortness of breath. History of asthma. Recently seen for A. fib with RVR. Patient is now in sinus rhythm and not tachycardic. Patient feels better after steroids and breathing treatment x2. She'll be discharged home.        Juliet Rude. Rubin Payor, MD 02/06/12 1217

## 2012-02-06 NOTE — ED Notes (Signed)
Pt. Reports difficulty breathing starting this AM. States she started taking diltiazem 2 weeks ago. Reports hx of asthma, took albuterol this AM without relief. York Spaniel "it feels like an asthma problem, I can hear myself wheezing".

## 2012-02-06 NOTE — ED Notes (Signed)
Pt sts about 6 this am started with increased SOB, urinary frequency. sts just doesn't feel right. Pt sts she took her diltiazem. Pt speaking in short sentences.

## 2012-02-06 NOTE — ED Notes (Signed)
I got pt in a gown and hooked up to the monitor.9:11am JG.

## 2012-02-06 NOTE — Discharge Instructions (Signed)

## 2012-02-21 ENCOUNTER — Emergency Department (HOSPITAL_COMMUNITY)
Admission: EM | Admit: 2012-02-21 | Discharge: 2012-02-21 | Disposition: A | Discharge status: Home / Self Care | Payer: Self-pay | Attending: Emergency Medicine | Admitting: Emergency Medicine

## 2012-02-21 ENCOUNTER — Encounter (HOSPITAL_COMMUNITY): Payer: Self-pay | Admitting: Emergency Medicine

## 2012-02-21 DIAGNOSIS — K219 Gastro-esophageal reflux disease without esophagitis: Secondary | ICD-10-CM | POA: Insufficient documentation

## 2012-02-21 DIAGNOSIS — J45901 Unspecified asthma with (acute) exacerbation: Secondary | ICD-10-CM | POA: Insufficient documentation

## 2012-02-21 DIAGNOSIS — I10 Essential (primary) hypertension: Secondary | ICD-10-CM | POA: Insufficient documentation

## 2012-02-21 MED ORDER — ALBUTEROL SULFATE (5 MG/ML) 0.5% IN NEBU
5.0000 mg | INHALATION_SOLUTION | Freq: Once | RESPIRATORY_TRACT | Status: AC
  Administered 2012-02-21: 5 mg via RESPIRATORY_TRACT
  Filled 2012-02-21: qty 1

## 2012-02-21 MED ORDER — PREDNISONE 50 MG PO TABS: ORAL_TABLET | ORAL | Status: DC

## 2012-02-21 MED ORDER — IPRATROPIUM BROMIDE 0.02 % IN SOLN
0.5000 mg | Freq: Once | RESPIRATORY_TRACT | Status: AC
  Administered 2012-02-21: 0.5 mg via RESPIRATORY_TRACT
  Filled 2012-02-21: qty 2.5

## 2012-02-21 MED ORDER — PREDNISONE 20 MG PO TABS
60.0000 mg | ORAL_TABLET | Freq: Once | ORAL | Status: AC
  Administered 2012-02-21: 60 mg via ORAL
  Filled 2012-02-21: qty 3

## 2012-02-21 MED ORDER — ALBUTEROL SULFATE HFA 108 (90 BASE) MCG/ACT IN AERS
2.0000 | INHALATION_SPRAY | RESPIRATORY_TRACT | Status: DC | PRN
  Administered 2012-02-21: 2 via RESPIRATORY_TRACT
  Filled 2012-02-21: qty 6.7

## 2012-02-21 NOTE — ED Notes (Addendum)
C/o sob, non-productive cough, and wheezing that started tonight.  Using albuterol inhaler without relief. Speaking in complete sentences.

## 2012-02-21 NOTE — Discharge Instructions (Signed)
Asthma, Adult  Asthma is a disease of the lungs and can make it hard to breathe. Asthma cannot be cured, but medicine can help control it. Asthma may be started (triggered) by:   Pollen.   Dust.   Animal skin flakes (dander).   Molds.   Foods.   Respiratory infections (colds, flu).   Smoke.   Exercise.   Stress.   Other things that cause allergic reactions or allergies (allergens).  HOME CARE    Talk to your doctor about how to manage your attacks at home. This may include:   Using a tool called a peak flow meter.   Having medicine ready to stop the attack.   Take all medicine as told by your doctor.   Wash bed sheets and blankets every week in hot water and put them in the dryer.   Drink enough fluids to keep your pee (urine) clear or pale yellow.   Always be ready to get emergency help. Write down the phone number for your doctor. Keep it where you can easily find it.   Talk about exercise routines with your doctor.   If animal dander is causing your asthma, you may need to find a new home for your pet(s).  GET HELP RIGHT AWAY IF:    You have muscle aches.   You cough more.   You have chest pain.   You have thick spit (sputum) that changes to yellow, green, gray, or bloody.   Medicine does not stop your wheezing.   You have problems breathing.   You have a fever.   Your medicine causes:   A rash.   Itching.   Puffiness (swelling).   Breathing problems.  MAKE SURE YOU:    Understand these instructions.   Will watch your condition.   Will get help right away if you are not doing well or get worse.  Document Released: 01/26/2008 Document Revised: 07/29/2011 Document Reviewed: 06/19/2008  [employer] Patient Information 2012 [employer].

## 2012-02-21 NOTE — ED Provider Notes (Signed)
History     CSN: [id number]  Arrival date & time 02/21/12  0029   First MD Initiated Contact with Patient 02/21/12 0040      Chief Complaint  Patient presents with  . Asthma     Patient is a 61 y.o. female presenting with asthma. The history is provided by the patient.  Asthma This is a recurrent problem. The current episode started 3 to 5 hours ago. The problem occurs constantly. The problem has been gradually worsening. Associated symptoms include shortness of breath. Pertinent negatives include no chest pain and no abdominal pain. Nothing aggravates the symptoms. The symptoms are relieved by medications. Treatments tried: albuterol. The treatment provided no relief.  Pt reports asthma attack starting earlier tonight She reports cough and wheezing No syncope No CP Similar to prior asthma exacerbations She denies smoking  Past Medical History  Diagnosis Date  . Asthma   . Hypertension   . GERD (gastroesophageal reflux disease)     Past Surgical History  Procedure Date  . Tubal ligation     Family History  Problem Relation Age of Onset  . Cancer Mother   . Cancer Father   . Heart attack Mother 66    History  Substance Use Topics  . Smoking status: Former Smoker    Quit date: 11/07/2011  . Smokeless tobacco: Never Used  . Alcohol Use: No    OB History    Grav Para Term Preterm Abortions TAB SAB Ect Mult Living                  Review of Systems  Respiratory: Positive for shortness of breath.   Cardiovascular: Negative for chest pain.  Gastrointestinal: Negative for abdominal pain.  All other systems reviewed and are negative.    Allergies  Celecoxib  Home Medications   Current Outpatient Rx  Name Route Sig Dispense Refill  . ALBUTEROL SULFATE HFA 108 (90 BASE) MCG/ACT IN AERS Inhalation Inhale 2 puffs into the lungs every 4 (four) hours as needed for wheezing. 1 each 0  . DILTIAZEM HCL ER BEADS 180 MG PO CP24 Oral Take 180 mg by mouth daily.      Marland Kitchen FLUTICASONE-SALMETEROL 100-50 MCG/DOSE IN AEPB Inhalation Inhale 1 puff into the lungs 2 (two) times daily. 60 each 0  . TIOTROPIUM BROMIDE MONOHYDRATE 18 MCG IN CAPS Inhalation Place 18 mcg into inhaler and inhale daily.      BP 152/120  Pulse 79  Temp 97.5 F (36.4 C) (Oral)  Resp 20  SpO2 98%  Physical Exam CONSTITUTIONAL: Well developed/well nourished HEAD AND FACE: Normocephalic/atraumatic EYES: EOMI/PERRL ENMT: Mucous membranes moist NECK: supple no meningeal signs SPINE:entire spine nontender CV: S1/S2 noted, no murmurs/rubs/gallops noted LUNGS: wheezing bilaterally, mild tachypnea noted.   ABDOMEN: soft, nontender, no rebound or guarding GU:no cva tenderness NEURO: Pt is awake/alert, moves all extremitiesx4 EXTREMITIES: pulses normal, full ROM, no significant edema noted SKIN: warm, color normal PSYCH: no abnormalities of mood noted  ED Course  Procedures   1:17 AM Pt with h/o asthma, with multiple CXR previously, will defer imaging Will obtain EKG Recent diagnosis of afib per records but coumadin not started due to concern for unable to check inr sufficiently, so only ASA now 2:47 AM Pt improved, wheeze improved she felt well on ambulation ,she reports she is taking ASA and currently in sinus rhythm BP 151/90  Pulse 74  Temp 97.5 F (36.4 C) (Oral)  Resp 20  SpO2 97%   MDM  Nursing notes including past medical history and social history reviewed and considered in documentation Previous records reviewed and considered        Date: 02/21/2012  Rate: 69  Rhythm: normal sinus rhythm  QRS Axis: normal  Intervals: normal  ST/T Wave abnormalities: nonspecific ST changes  Conduction Disutrbances:none     Joya Gaskins, MD 02/21/12 [phone]

## 2012-02-22 ENCOUNTER — Encounter (HOSPITAL_COMMUNITY): Payer: Self-pay | Admitting: Emergency Medicine

## 2012-02-22 ENCOUNTER — Emergency Department (HOSPITAL_COMMUNITY)
Admission: EM | Admit: 2012-02-22 | Discharge: 2012-02-22 | Disposition: A | Discharge status: Home / Self Care | Payer: Self-pay | Attending: Emergency Medicine | Admitting: Emergency Medicine

## 2012-02-22 ENCOUNTER — Emergency Department (HOSPITAL_COMMUNITY): Payer: Self-pay

## 2012-02-22 DIAGNOSIS — I10 Essential (primary) hypertension: Secondary | ICD-10-CM | POA: Insufficient documentation

## 2012-02-22 DIAGNOSIS — J4489 Other specified chronic obstructive pulmonary disease: Secondary | ICD-10-CM | POA: Insufficient documentation

## 2012-02-22 DIAGNOSIS — I4891 Unspecified atrial fibrillation: Secondary | ICD-10-CM | POA: Insufficient documentation

## 2012-02-22 DIAGNOSIS — K219 Gastro-esophageal reflux disease without esophagitis: Secondary | ICD-10-CM | POA: Insufficient documentation

## 2012-02-22 DIAGNOSIS — Z87891 Personal history of nicotine dependence: Secondary | ICD-10-CM | POA: Insufficient documentation

## 2012-02-22 DIAGNOSIS — R51 Headache: Secondary | ICD-10-CM | POA: Insufficient documentation

## 2012-02-22 DIAGNOSIS — J449 Chronic obstructive pulmonary disease, unspecified: Secondary | ICD-10-CM

## 2012-02-22 HISTORY — DX: Unspecified atrial fibrillation: I48.91

## 2012-02-22 LAB — CBC WITH DIFFERENTIAL/PLATELET
Basophils Relative: 1 % (ref 0–1)
HCT: 40.9 % (ref 36.0–46.0)
Hemoglobin: 13.9 g/dL (ref 12.0–15.0)
Lymphocytes Relative: 39 % (ref 12–46)
Lymphs Abs: 2 10*3/uL (ref 0.7–4.0)
MCHC: 34 g/dL (ref 30.0–36.0)
MCV: 80.7 fL (ref 78.0–100.0)
Monocytes Relative: 8 % (ref 3–12)
Neutro Abs: 2.3 10*3/uL (ref 1.7–7.7)
RDW: 16.1 % — ABNORMAL HIGH (ref 11.5–15.5)
WBC: 5.2 10*3/uL (ref 4.0–10.5)

## 2012-02-22 LAB — BASIC METABOLIC PANEL
BUN: 13 mg/dL (ref 6–23)
Chloride: 104 mEq/L (ref 96–112)
GFR calc Af Amer: >90 mL/min (ref >90)
GFR calc non Af Amer: >90 mL/min (ref >90)
Glucose, Bld: 96 mg/dL (ref 70–99)
Potassium: 3.2 mEq/L — ABNORMAL LOW (ref 3.5–5.1)
Sodium: 139 mEq/L (ref 135–145)

## 2012-02-22 LAB — APTT: aPTT: 27 seconds (ref 24–37)

## 2012-02-22 LAB — PROTIME-INR: INR: 0.96 (ref 0.00–1.49)

## 2012-02-22 MED ORDER — DILTIAZEM HCL 50 MG/10ML IV SOLN
10.0000 mg | Freq: Once | INTRAVENOUS | Status: AC
  Administered 2012-02-22: 10 mg via INTRAVENOUS
  Filled 2012-02-22: qty 2

## 2012-02-22 MED ORDER — DILTIAZEM HCL 25 MG/5ML IV SOLN
10.0000 mg | Freq: Once | INTRAVENOUS | Status: DC
  Filled 2012-02-22: qty 5

## 2012-02-22 MED ORDER — ONDANSETRON HCL 4 MG/2ML IJ SOLN
4.0000 mg | Freq: Once | INTRAMUSCULAR | Status: AC
  Administered 2012-02-22: 4 mg via INTRAVENOUS
  Filled 2012-02-22: qty 2

## 2012-02-22 MED ORDER — FENTANYL CITRATE 0.05 MG/ML IJ SOLN
50.0000 ug | Freq: Once | INTRAMUSCULAR | Status: AC
  Administered 2012-02-22: 50 ug via INTRAVENOUS
  Filled 2012-02-22: qty 2

## 2012-02-22 MED ORDER — DILTIAZEM HCL 50 MG/10ML IV SOLN
10.0000 mg | Freq: Once | INTRAVENOUS | Status: DC
  Filled 2012-02-22: qty 2

## 2012-02-22 MED ORDER — HYDROMORPHONE HCL PF 1 MG/ML IJ SOLN
1.0000 mg | Freq: Once | INTRAMUSCULAR | Status: AC
  Administered 2012-02-22: 1 mg via INTRAVENOUS
  Filled 2012-02-22: qty 1

## 2012-02-22 NOTE — ED Provider Notes (Signed)
Medical screening examination/treatment/procedure(s) were conducted as a shared visit with non-physician practitioner(s) and myself.  I personally evaluated the patient during the encounter  Pt with afib RVR, controlled with Cardizem, poor candidate for anticoagulation. HA improved. Wheezing recently but also improved.   Leovardo Thoman B. Bernette Mayers, MD 02/22/12 [phone]

## 2012-02-22 NOTE — ED Notes (Signed)
Pt noted currently to be in Afib, pt states she does have history of irregular HR and is on diltiazem.

## 2012-02-22 NOTE — ED Provider Notes (Signed)
History     CSN: [id number]  Arrival date & time 02/22/12  1126   First MD Initiated Contact with Patient 02/22/12 1214      Chief Complaint  Patient presents with  . Asthma  . Headache  . Tachycardia    (Consider location/radiation/quality/duration/timing/severity/associated sxs/prior treatment) HPI  Patient has history of asthma as well as history of atrial fib with RVR however states she has not followed by a cardiologist however followed by [facility] Dr. Norberto Sorenson, presents to emergency department stating that she has been having asthma attack asthma exacerbation since yesterday stating that she was at home last night and use multiple doses of her albuterol inhaler and then woke this morning with HA. Patient states she woke this morning with improved asthma symptoms however she woke with a severe headache, feeling lightheaded, and feeling like her heart was racing. Patient states she has been taking her daily diltiazem as directed. She denies aggravating or alleviating factors. She took nothing for pain prior to arrival. Once again, patient states asthma symptoms have improved stating only mild shortness of breath and mild wheeze currently. Denies fevers, chills, stiff neck, visual changes, CP, abdominal pain, n/v/d, extremity numbness/tingling/weakness.   In review of prior hospital admission, internal medicine doctor makes note that patient is going to be poorly compliant to Coumadin due to her lack of transportation for routine INR checks. Therefore an alternative therapy a daily aspirin along with her Cardizem was started. Cardiology was consulted at that time and also agreed that she should not be started on Coumadin with she could have close INR followup and that if he were the case then aspirin and Cardizem would be her alternative. It does not appear that patient has followup closely with her primary care provider to further address her history of  paroxysmal atrial fib.   Past Medical History  Diagnosis Date  . Asthma   . Hypertension   . GERD (gastroesophageal reflux disease)     Past Surgical History  Procedure Date  . Tubal ligation     Family History  Problem Relation Age of Onset  . Cancer Mother   . Cancer Father   . Heart attack Mother 33    History  Substance Use Topics  . Smoking status: Former Smoker    Quit date: 11/07/2011  . Smokeless tobacco: Never Used  . Alcohol Use: No    OB History    Grav Para Term Preterm Abortions TAB SAB Ect Mult Living                  Review of Systems  All other systems reviewed and are negative.    Allergies  Celecoxib  Home Medications   Current Outpatient Rx  Name Route Sig Dispense Refill  . ALBUTEROL SULFATE HFA 108 (90 BASE) MCG/ACT IN AERS Inhalation Inhale 2 puffs into the lungs daily.    Marland Kitchen DILTIAZEM HCL ER BEADS 180 MG PO CP24 Oral Take 180 mg by mouth daily.    Marland Kitchen PREDNISONE 20 MG PO TABS Oral Take 40 mg by mouth daily.    Marland Kitchen TIOTROPIUM BROMIDE MONOHYDRATE 18 MCG IN CAPS Inhalation Place 18 mcg into inhaler and inhale daily.      BP 144/91  Pulse 50  Temp 98.3 F (36.8 C) (Oral)  Resp 20  SpO2 98%  Physical Exam  Nursing note and vitals reviewed. Constitutional: She is oriented to person, place, and time. She appears well-developed and  well-nourished. No distress.  HENT:  Head: Normocephalic and atraumatic.  Eyes: Conjunctivae and EOM are normal. Pupils are equal, round, and reactive to light.       No nystagmus  Neck: Normal range of motion. Neck supple.       No meningeal signs.   Cardiovascular: Normal heart sounds and intact distal pulses.  An irregularly irregular rhythm present. Tachycardia present.  Exam reveals no gallop and no friction rub.   No murmur heard. Pulmonary/Chest: Effort normal. No respiratory distress. She has wheezes. She has no rales. She exhibits no tenderness.       Faint expiratory wheezing bilaterally with good  air movement.   Abdominal: Soft. Bowel sounds are normal. She exhibits no distension and no mass. There is no tenderness. There is no rebound and no guarding.  Musculoskeletal: Normal range of motion. She exhibits no edema and no tenderness.  Neurological: She is alert and oriented to person, place, and time.  Skin: Skin is warm and dry. No rash noted. She is not diaphoretic. No erythema.  Psychiatric: She has a normal mood and affect.    ED Course  Procedures (including critical care time)  IV cardizem, IV fentanyl.   Patient states she has some mild improvement with her headache after the canal but ongoing headache. She continues to deny any associated chest pain. We'll re\re dose him pain medication to further treat her headache. Cardizem dose just recently and will recheck her HR and rhythm in the near future.   Date: 02/22/2012  Rate: 116  Rhythm: atrial flutter and premature ventricular contractions (PVC)  QRS Axis: normal  Intervals: normal  ST/T Wave abnormalities: nonspecific ST changes  Conduction Disutrbances:none  Narrative Interpretation:   Old EKG Reviewed: non provocative but canged from Feb 21, 2012   Labs Reviewed  CBC WITH DIFFERENTIAL - Abnormal; Notable for the following:    RDW 16.1 (*)     Eosinophils Relative 7 (*)     All other components within normal limits  BASIC METABOLIC PANEL - Abnormal; Notable for the following:    Potassium 3.2 (*)     All other components within normal limits  PROTIME-INR  APTT   Dg Chest 2 View  02/22/2012  *RADIOLOGY REPORT*  Clinical Data: Shortness of breath.  Wheezing.  History of asthma.  CHEST - 2 VIEW  Comparison: Two-view chest x-ray 02/06/2012, 01/06/2012, 10/11/2009.  CT chest 03/04/2011.  Findings: Cardiac silhouette upper normal in size for the AP technique, unchanged when allowing for differences in technique. Thoracic aorta mildly tortuous.  Hilar and mediastinal contours otherwise unremarkable.  Lungs hyperinflated  but clear.  No pleural effusions.  Visualized bony thorax intact.  No significant interval change.  IMPRESSION: Hyperinflation consistent with COPD and/or asthma.  No acute cardiopulmonary disease.  Stable borderline heart size.  Original Report Authenticated By: Arnell Sieving, M.D.     1. A-fib   2. COPD (chronic obstructive pulmonary disease)   3. Headache       MDM  Patient's headache is much improved during ER stay with no neuro focal deficits are worrisome red flags for headache throughout ER stay. Her atrial fib break his now been well controlled with Cardizem and we will continue on her daily Cardizem and aspirin regimen. She has no respiratory difficulty with only faint expiratory wheezing with good air movement bilaterally. Spoke at length with patient about the importance of following up closely with her primary care provider for ongoing management of her COPD  and asthma as well as to further discuss her atrial fibrillation management and whether or not she needs to followup with cardiology in office. Patient voices her understanding and is agreeable plan.  Patient evaluated by Dr. Bernette Mayers at bedside who is agreeable to plan.         [place], [place] 02/22/12 1459

## 2012-02-22 NOTE — ED Notes (Addendum)
Pt c/o having asthma attack last. Pt took her asthma medication without relief. Pt began to have headache and feeling like her heart racing last night. Pt last used inhaler this morning without relief.

## 2012-02-22 NOTE — ED Notes (Signed)
Pt states "I feel like my BP is up and I probably used my inhaler too much last night and it felt like my heart was fluttering. It feels like it every now and then this morning too." Pt states "I still feel kind of short of breath." Pt used 2 puffs of albuterol inhaler this am.

## 2012-02-23 ENCOUNTER — Emergency Department (HOSPITAL_COMMUNITY)
Admission: EM | Admit: 2012-02-23 | Discharge: 2012-02-23 | Disposition: A | Discharge status: Home / Self Care | Payer: Self-pay | Attending: Emergency Medicine | Admitting: Emergency Medicine

## 2012-02-23 ENCOUNTER — Encounter (HOSPITAL_COMMUNITY): Payer: Self-pay | Admitting: Emergency Medicine

## 2012-02-23 ENCOUNTER — Emergency Department (HOSPITAL_COMMUNITY): Payer: Self-pay

## 2012-02-23 DIAGNOSIS — I10 Essential (primary) hypertension: Secondary | ICD-10-CM | POA: Insufficient documentation

## 2012-02-23 DIAGNOSIS — J45909 Unspecified asthma, uncomplicated: Secondary | ICD-10-CM

## 2012-02-23 DIAGNOSIS — K219 Gastro-esophageal reflux disease without esophagitis: Secondary | ICD-10-CM | POA: Insufficient documentation

## 2012-02-23 DIAGNOSIS — J45901 Unspecified asthma with (acute) exacerbation: Secondary | ICD-10-CM | POA: Insufficient documentation

## 2012-02-23 DIAGNOSIS — Z87891 Personal history of nicotine dependence: Secondary | ICD-10-CM | POA: Insufficient documentation

## 2012-02-23 LAB — CBC WITH DIFFERENTIAL/PLATELET
Eosinophils Absolute: 0.4 10*3/uL (ref 0.0–0.7)
Eosinophils Relative: 7 % — ABNORMAL HIGH (ref 0–5)
HCT: 41.8 % (ref 36.0–46.0)
Lymphs Abs: 2.2 10*3/uL (ref 0.7–4.0)
MCHC: 33.5 g/dL (ref 30.0–36.0)
Monocytes Absolute: 0.4 10*3/uL (ref 0.1–1.0)
Monocytes Relative: 8 % (ref 3–12)
Platelets: 228 10*3/uL (ref 150–400)
RDW: 16 % — ABNORMAL HIGH (ref 11.5–15.5)
WBC: 5.5 10*3/uL (ref 4.0–10.5)

## 2012-02-23 LAB — BASIC METABOLIC PANEL
BUN: 14 mg/dL (ref 6–23)
Calcium: 10.3 mg/dL (ref 8.4–10.5)
Creatinine, Ser: 0.77 mg/dL (ref 0.50–1.10)
GFR calc non Af Amer: 88 mL/min — ABNORMAL LOW (ref >90)
Glucose, Bld: 103 mg/dL — ABNORMAL HIGH (ref 70–99)

## 2012-02-23 MED ORDER — METHYLPREDNISOLONE SODIUM SUCC 125 MG IJ SOLR
125.0000 mg | Freq: Once | INTRAMUSCULAR | Status: AC
  Administered 2012-02-23: 125 mg via INTRAVENOUS
  Filled 2012-02-23: qty 2

## 2012-02-23 MED ORDER — IPRATROPIUM BROMIDE 0.02 % IN SOLN
0.5000 mg | Freq: Once | RESPIRATORY_TRACT | Status: AC
  Administered 2012-02-23: 0.5 mg via RESPIRATORY_TRACT
  Filled 2012-02-23: qty 2.5

## 2012-02-23 MED ORDER — PREDNISONE 50 MG PO TABS: ORAL_TABLET | ORAL | Status: AC

## 2012-02-23 MED ORDER — ALBUTEROL SULFATE (5 MG/ML) 0.5% IN NEBU
2.5000 mg | INHALATION_SOLUTION | Freq: Once | RESPIRATORY_TRACT | Status: AC
  Administered 2012-02-23: 2.5 mg via RESPIRATORY_TRACT
  Filled 2012-02-23: qty 0.5

## 2012-02-23 MED ORDER — ALBUTEROL SULFATE (2.5 MG/3ML) 0.083% IN NEBU: 2.5000 mg | INHALATION_SOLUTION | Freq: Four times a day (QID) | RESPIRATORY_TRACT | Status: DC | PRN

## 2012-02-23 NOTE — Progress Notes (Signed)
Met with patient per consult from Dr. Preston Fleeting. She routinely goes to [facility] and has an appt on July 26th. Dr. Preston Fleeting wants patient to start using a nebulizer. Have left messages with Justin/Darion of [facility] to obtain a nebulizer. I have contacted both [facility] and [facility] for the cost of the Albuterol 2.5mg  ampules. [facility] will cost $4 for 25 and [facility] will cost $9 for 25. I have informed both the patient and physician. Patient states she will not have any problems with obtaining the medication.

## 2012-02-23 NOTE — ED Notes (Signed)
Pt. C/o of having trouble breathing since yesterday. Has been diaphoretic. Denies chest pain. NVD.

## 2012-02-23 NOTE — ED Provider Notes (Signed)
Patient is sent to the CDU by Dr. Preston Fleeting for holding waiting case management to assist in the obtaining of a home nebulizer machine. Once the nebulized paperwork is established then patient will be discharged home with nebulized solution prescription as well as a extended course of her prednisone. Patient was seen 2 days ago for asthma exacerbation in ER and seen again in ER yesterday for atrial fib with improved asthma but once again returns to ER today with asthma exacerbation. After nebulized treatments in main ER, she's feeling much improved. She is resting comfortably in no acute distress. Little to no expiratory wheeze with good air movement throughout. Will continue to monitor while waiting in the ER.  [place], [place] 02/23/12 1143

## 2012-02-23 NOTE — ED Provider Notes (Signed)
History     CSN: [id number]  Arrival date & time 02/23/12  6295   First MD Initiated Contact with Patient 02/23/12 [phone]      Chief Complaint  Patient presents with  . Asthma    (Consider location/radiation/quality/duration/timing/severity/associated sxs/prior treatment) Patient is a 61 y.o. female presenting with asthma. The history is provided by the patient.  Asthma  She in the emergency department yesterday with heart problems. She had also had some asthma difficulty but the asthma had cleared. On going home, she felt better but had recurrence of her asthma last night. She is complaining of a nonproductive cough, severe wheezing, and severe dyspnea. Pain, nausea, vomiting, diarrhea. She denies fever or chills but has had diaphoresis. She says her heart is not giving her any problems today, distress or breathing. She did use her inhaler with no relief.  Past Medical History  Diagnosis Date  . Asthma   . Hypertension   . GERD (gastroesophageal reflux disease)   . A-fib     Past Surgical History  Procedure Date  . Tubal ligation     Family History  Problem Relation Age of Onset  . Cancer Mother   . Cancer Father   . Heart attack Mother 57    History  Substance Use Topics  . Smoking status: Former Smoker    Quit date: 11/07/2011  . Smokeless tobacco: Never Used  . Alcohol Use: No    OB History    Grav Para Term Preterm Abortions TAB SAB Ect Mult Living                  Review of Systems  All other systems reviewed and are negative.    Allergies  Celecoxib  Home Medications   Current Outpatient Rx  Name Route Sig Dispense Refill  . ALBUTEROL SULFATE HFA 108 (90 BASE) MCG/ACT IN AERS Inhalation Inhale 2 puffs into the lungs daily.    Marland Kitchen DILTIAZEM HCL ER BEADS 180 MG PO CP24 Oral Take 180 mg by mouth daily.    Marland Kitchen PREDNISONE 20 MG PO TABS Oral Take 40 mg by mouth daily.    Marland Kitchen TIOTROPIUM BROMIDE MONOHYDRATE 18 MCG IN CAPS Inhalation Place 18 mcg into inhaler  and inhale daily.      There were no vitals taken for this visit.  Physical Exam  Nursing note and vitals reviewed. 61 year old female who is moderately dyspneic. Vital signs are significant for tachypnea with respiratory rate of 24, and hypertension with blood pressure 157/96. Saturation is 100% which is normal. Head is normocephalic and atraumatic. PERRLA, EOMI. Neck is nontender and supple. Back is nontender. Lungs have diffuse wheezes and she does have moderate use of accessory muscles. No rales or rhonchi are heard. Heart has regular rate and rhythm without murmur. Abdomen is soft, flat, nontender without masses or hepatosplenomegaly. Extremities have no cyanosis or edema versus present. Skin is warm and dry without rash. Neurologic: Mental status is normal, cranial nerves are intact, there are no motor or sensory deficits.  ED Course  Procedures (including critical care time)  Results for orders placed during the hospital encounter of 02/23/12  CBC WITH DIFFERENTIAL      Component Value Range   WBC 5.5  4.0 - 10.5 K/uL   RBC 5.20 (*) 3.87 - 5.11 MIL/uL   Hemoglobin 14.0  12.0 - 15.0 g/dL   HCT 32.4  40.1 - 02.7 %   MCV 80.4  78.0 - 100.0 fL  MCH 26.9  26.0 - 34.0 pg   MCHC 33.5  30.0 - 36.0 g/dL   RDW 16.1 (*) 09.6 - 04.5 %   Platelets 228  150 - 400 K/uL   Neutrophils Relative 45  43 - 77 %   Neutro Abs 2.5  1.7 - 7.7 K/uL   Lymphocytes Relative 40  12 - 46 %   Lymphs Abs 2.2  0.7 - 4.0 K/uL   Monocytes Relative 8  3 - 12 %   Monocytes Absolute 0.4  0.1 - 1.0 K/uL   Eosinophils Relative 7 (*) 0 - 5 %   Eosinophils Absolute 0.4  0.0 - 0.7 K/uL   Basophils Relative 1  0 - 1 %   Basophils Absolute 0.0  0.0 - 0.1 K/uL  BASIC METABOLIC PANEL      Component Value Range   Sodium 140  135 - 145 mEq/L   Potassium 3.3 (*) 3.5 - 5.1 mEq/L   Chloride 101  96 - 112 mEq/L   CO2 27  19 - 32 mEq/L   Glucose, Bld 103 (*) 70 - 99 mg/dL   BUN 14  6 - 23 mg/dL   Creatinine, Ser 4.09   0.50 - 1.10 mg/dL   Calcium 81.1  8.4 - 91.4 mg/dL   GFR calc non Af Amer 88 (*) >90 mL/min   GFR calc Af Amer >90  >90 mL/min    Dg Chest Portable 1 View  02/23/2012  *RADIOLOGY REPORT*  Clinical Data: Short of breath.  Cough.  Asthma.  PORTABLE CHEST - 1 VIEW  Comparison: Yesterday  Findings: Hyperaeration.  Clear lungs.  Upper normal heart size. Bronchitic changes.  No pneumothorax.  IMPRESSION: Changes related to COPD.  Original Report Authenticated By: Donavan Burnet, M.D.     Date: 02/23/2012  Rate: 84  Rhythm: normal sinus rhythm  QRS Axis: normal  Intervals: normal  ST/T Wave abnormalities: normal  Conduction Disutrbances:none  Narrative Interpretation: Right atrial enlargement. When compared with ECG of 02/22/2012, normal sinus rhythm has replaced atrial fibrillation.  Old EKG Reviewed: changes noted    1. Asthma       MDM  Exacerbation of asthma. Prior records were reviewed and she was seen in the emergency department yesterday for atrial fibrillation and headache. I do not see any record of treatment for asthma and during her ED stay yesterday. However, she had been seen for asthma 2 days ago and was given a prescription for prednisone 50 mg. She has 2 ED visits for asthma in the last month, and had been admitted for asthma 2 months ago. Chest x-ray will be repeated and she will be given an albuterol with Atrovent nebulizer.  0900: She got significant relief with albuterol with Atrovent. She is no longer using accessory muscles of respiration, airflow is much improved. However, she still has mild residual wheezing. Albuterol with Atrovent will be repeated.  1000: After a second albuterol with Atrovent nebulizer treatment, lungs are completely clear. I am concerned that she urgency department so soon after prior ED visit and she is still on prednisone. Case Management is consulted to see if she can qualify for a home nebulizer.  1100: Case management will arrange for home  nebulizer. Patient will be held in CDU until the nebulizer is available for her. She'll be sent home with prescription for albuterol solution for nebulizer and additional prednisone.  Dione Booze, MD 02/23/12 [phone]

## 2012-02-23 NOTE — ED Notes (Signed)
Pt. Also c/o of left leg pain. Reports falling recently.

## 2012-02-24 NOTE — ED Provider Notes (Signed)
Medical screening examination/treatment/procedure(s) were conducted as a shared visit with non-physician practitioner(s) and myself.  I personally evaluated the patient during the encounter   Dione Booze, MD 02/24/12 2149

## 2012-04-07 ENCOUNTER — Emergency Department (HOSPITAL_COMMUNITY): Payer: Self-pay

## 2012-04-07 ENCOUNTER — Emergency Department (HOSPITAL_COMMUNITY)
Admission: EM | Admit: 2012-04-07 | Discharge: 2012-04-07 | Disposition: A | Discharge status: Home / Self Care | Payer: Self-pay | Attending: Emergency Medicine | Admitting: Emergency Medicine

## 2012-04-07 ENCOUNTER — Encounter (HOSPITAL_COMMUNITY): Payer: Self-pay | Admitting: Emergency Medicine

## 2012-04-07 DIAGNOSIS — J4489 Other specified chronic obstructive pulmonary disease: Secondary | ICD-10-CM | POA: Insufficient documentation

## 2012-04-07 DIAGNOSIS — Z87891 Personal history of nicotine dependence: Secondary | ICD-10-CM | POA: Insufficient documentation

## 2012-04-07 DIAGNOSIS — J189 Pneumonia, unspecified organism: Secondary | ICD-10-CM | POA: Insufficient documentation

## 2012-04-07 DIAGNOSIS — J449 Chronic obstructive pulmonary disease, unspecified: Secondary | ICD-10-CM | POA: Insufficient documentation

## 2012-04-07 DIAGNOSIS — I4891 Unspecified atrial fibrillation: Secondary | ICD-10-CM | POA: Insufficient documentation

## 2012-04-07 DIAGNOSIS — K219 Gastro-esophageal reflux disease without esophagitis: Secondary | ICD-10-CM | POA: Insufficient documentation

## 2012-04-07 DIAGNOSIS — I10 Essential (primary) hypertension: Secondary | ICD-10-CM | POA: Insufficient documentation

## 2012-04-07 LAB — CBC WITH DIFFERENTIAL/PLATELET
Basophils Absolute: 0 10*3/uL (ref 0.0–0.1)
Basophils Relative: 0 % (ref 0–1)
MCH: 27.5 pg (ref 26.0–34.0)
MCHC: 34.4 g/dL (ref 30.0–36.0)
Metamyelocytes Relative: 0 %
Myelocytes: 0 %
Neutro Abs: 1.9 10*3/uL (ref 1.7–7.7)
Neutrophils Relative %: 30 % — ABNORMAL LOW (ref 43–77)
Platelets: 212 10*3/uL (ref 150–400)
Promyelocytes Absolute: 0 %
nRBC: 0 /100 WBC

## 2012-04-07 LAB — BASIC METABOLIC PANEL
CO2: 25 mEq/L (ref 19–32)
Chloride: 108 mEq/L (ref 96–112)
Creatinine, Ser: 0.72 mg/dL (ref 0.50–1.10)
Potassium: 3.4 mEq/L — ABNORMAL LOW (ref 3.5–5.1)

## 2012-04-07 MED ORDER — ESOMEPRAZOLE MAGNESIUM 40 MG PO CPDR: 40.0000 mg | DELAYED_RELEASE_CAPSULE | Freq: Every day | ORAL | Status: DC

## 2012-04-07 MED ORDER — ALBUTEROL SULFATE HFA 108 (90 BASE) MCG/ACT IN AERS
1.0000 | INHALATION_SPRAY | Freq: Once | RESPIRATORY_TRACT | Status: AC
  Administered 2012-04-07: 1 via RESPIRATORY_TRACT
  Filled 2012-04-07: qty 6.7

## 2012-04-07 MED ORDER — LEVOFLOXACIN 500 MG PO TABS
500.0000 mg | ORAL_TABLET | Freq: Every day | ORAL | Status: DC
  Administered 2012-04-07: 500 mg via ORAL
  Filled 2012-04-07: qty 1

## 2012-04-07 MED ORDER — LEVOFLOXACIN 500 MG PO TABS: 500.0000 mg | ORAL_TABLET | Freq: Every day | ORAL | Status: AC

## 2012-04-07 MED ORDER — DILTIAZEM HCL ER BEADS 180 MG PO CP24: 180.0000 mg | ORAL_CAPSULE | Freq: Every day | ORAL | Status: DC

## 2012-04-07 NOTE — ED Notes (Signed)
PT. REPORTS MID CHEST / EPIGASTRIC PAIN WITH SOB AND DRY COUGH FOR 2 DAYS , DENIES NAUSEA OR DIAPHORESIS. PT. STATES SHE RAN OUT OF HER INHALER.

## 2012-04-07 NOTE — Progress Notes (Signed)
CM was called to ED to assist with medications.Met with patient and explained that she is not eligible due to the fact she has been assisted in May at [facility] so she is not eligible for assistance and Levaquin and Tiazac are both generic and can be obtained at [facility] inexpensively. Nexium is more expensive and patient was given a Rx assistance card and instructions on how to apply. ED nurse off floor so unable to advise. Will attempt to call her and let her know to give patient her Rx to take with her. Genella Rife Weeks 04/07/2012 900am

## 2012-04-07 NOTE — ED Notes (Signed)
Pt c/o CP for the past 2 days, states it is a constant ache in her chest accompanied with SOB.  Has run out of inhaler.  Wheezing heard in lower lung fields.

## 2012-04-07 NOTE — ED Provider Notes (Addendum)
History     CSN: [id number]  Arrival date & time 04/07/12  0505   First MD Initiated Contact with Patient 04/07/12 [phone]      Chief Complaint  Patient presents with  . Chest Pain    (Consider location/radiation/quality/duration/timing/severity/associated sxs/prior treatment) Patient is a 61 y.o. female presenting with chest pain. The history is provided by the patient.  Chest Pain The chest pain began 2 days ago. Chest pain occurs constantly. The chest pain is improving. The pain is associated with coughing. At its most intense, the pain is at 1/10. The pain is currently at 1/10. The severity of the pain is mild. The quality of the pain is described as aching and brief. The pain does not radiate. Primary symptoms include shortness of breath and cough. She tried beta-agonist inhalers for the symptoms.  Her past medical history is significant for arrhythmia and COPD.     Past Medical History  Diagnosis Date  . Asthma   . Hypertension   . GERD (gastroesophageal reflux disease)   . A-fib     Past Surgical History  Procedure Date  . Tubal ligation     Family History  Problem Relation Age of Onset  . Cancer Mother   . Cancer Father   . Heart attack Mother 16    History  Substance Use Topics  . Smoking status: Former Smoker    Quit date: 11/07/2011  . Smokeless tobacco: Never Used  . Alcohol Use: No    OB History    Grav Para Term Preterm Abortions TAB SAB Ect Mult Living                  Review of Systems  Respiratory: Positive for cough and shortness of breath.   Cardiovascular: Positive for chest pain.  All other systems reviewed and are negative.    Allergies  Celecoxib  Home Medications   Current Outpatient Rx  Name Route Sig Dispense Refill  . ALBUTEROL SULFATE HFA 108 (90 BASE) MCG/ACT IN AERS Inhalation Inhale 2 puffs into the lungs every 6 (six) hours as needed. For shortness of breath    . ALBUTEROL SULFATE (2.5 MG/3ML) 0.083% IN NEBU  Nebulization Take 3 mLs (2.5 mg total) by nebulization every 6 (six) hours as needed for wheezing. 75 mL 12  . DILTIAZEM HCL ER BEADS 180 MG PO CP24 Oral Take 180 mg by mouth daily.    Marland Kitchen TIOTROPIUM BROMIDE MONOHYDRATE 18 MCG IN CAPS Inhalation Place 18 mcg into inhaler and inhale daily.      BP 130/89  Pulse 70  Temp 98.7 F (37.1 C) (Oral)  Resp 14  SpO2 98%  Physical Exam  Constitutional: She is oriented to person, place, and time. She appears well-developed and well-nourished.  HENT:  Head: Normocephalic and atraumatic.  Eyes: Conjunctivae and EOM are normal. Pupils are equal, round, and reactive to light.  Neck: Normal range of motion.  Cardiovascular: Normal rate, regular rhythm and normal heart sounds.   Pulmonary/Chest: Effort normal. She has wheezes.  Abdominal: Soft. Bowel sounds are normal.  Musculoskeletal: Normal range of motion.  Neurological: She is alert and oriented to person, place, and time.  Skin: Skin is warm and dry.  Psychiatric: She has a normal mood and affect. Her behavior is normal.    ED Course  Procedures (including critical care time)   Labs Reviewed  CBC WITH DIFFERENTIAL  BASIC METABOLIC PANEL   No results found.   No diagnosis found.  Date: 04/07/2012  Rate: 75  Rhythm: normal sinus rhythm  QRS Axis: normal  Intervals: normal  ST/T Wave abnormalities: normal  Conduction Disutrbances: none  Narrative Interpretation: unremarkable     MDM  Pt with hx of copd,  With cough.  Out of inhaler.  Now with chest pain when coughs.  Will check cardiac enzymes,  Chest xray.  Beta agonist therapy in ed,  reasses.  ce neg.  ? Pneumonia on cxr.  Will dc with abx, sat well.        Carl Butner Lytle Michaels, MD 04/07/12 [id number]  Rosanne Ashing, MD 04/07/12 4696

## 2012-04-07 NOTE — ED Notes (Signed)
Pt requesting assistance with getting her medications, care management called, no one available until 0830

## 2012-04-24 ENCOUNTER — Encounter (HOSPITAL_COMMUNITY): Payer: Self-pay | Admitting: *Deleted

## 2012-04-24 ENCOUNTER — Emergency Department (HOSPITAL_COMMUNITY): Payer: Medicaid Other

## 2012-04-24 ENCOUNTER — Emergency Department (HOSPITAL_COMMUNITY)
Admission: EM | Admit: 2012-04-24 | Discharge: 2012-04-24 | Disposition: A | Discharge status: Home / Self Care | Payer: Medicaid Other | Attending: Emergency Medicine | Admitting: Emergency Medicine

## 2012-04-24 DIAGNOSIS — J45909 Unspecified asthma, uncomplicated: Secondary | ICD-10-CM | POA: Insufficient documentation

## 2012-04-24 DIAGNOSIS — Z79899 Other long term (current) drug therapy: Secondary | ICD-10-CM | POA: Insufficient documentation

## 2012-04-24 DIAGNOSIS — I10 Essential (primary) hypertension: Secondary | ICD-10-CM | POA: Insufficient documentation

## 2012-04-24 DIAGNOSIS — J40 Bronchitis, not specified as acute or chronic: Secondary | ICD-10-CM | POA: Insufficient documentation

## 2012-04-24 DIAGNOSIS — K219 Gastro-esophageal reflux disease without esophagitis: Secondary | ICD-10-CM | POA: Insufficient documentation

## 2012-04-24 LAB — URINE MICROSCOPIC-ADD ON

## 2012-04-24 LAB — COMPREHENSIVE METABOLIC PANEL
CO2: 24 mEq/L (ref 19–32)
Calcium: 10.5 mg/dL (ref 8.4–10.5)
Creatinine, Ser: 0.65 mg/dL (ref 0.50–1.10)
GFR calc Af Amer: >90 mL/min (ref >90)
GFR calc non Af Amer: >90 mL/min (ref >90)
Glucose, Bld: 88 mg/dL (ref 70–99)

## 2012-04-24 LAB — URINALYSIS, ROUTINE W REFLEX MICROSCOPIC
Leukocytes, UA: NEGATIVE
Nitrite: NEGATIVE
Protein, ur: 100 mg/dL — AB
Urobilinogen, UA: 0.2 mg/dL (ref 0.0–1.0)

## 2012-04-24 LAB — CBC WITH DIFFERENTIAL/PLATELET
Basophils Absolute: 0 10*3/uL (ref 0.0–0.1)
Eosinophils Relative: 21 % — ABNORMAL HIGH (ref 0–5)
HCT: 42.1 % (ref 36.0–46.0)
Lymphocytes Relative: 26 % (ref 12–46)
Lymphs Abs: 1.3 10*3/uL (ref 0.7–4.0)
MCH: 27.3 pg (ref 26.0–34.0)
MCV: 79.7 fL (ref 78.0–100.0)
Monocytes Absolute: 0.4 10*3/uL (ref 0.1–1.0)
RDW: 14.5 % (ref 11.5–15.5)
WBC: 5 10*3/uL (ref 4.0–10.5)

## 2012-04-24 LAB — TROPONIN I: Troponin I: <0.3 ng/mL (ref <0.30)

## 2012-04-24 MED ORDER — ALBUTEROL SULFATE (5 MG/ML) 0.5% IN NEBU
5.0000 mg | INHALATION_SOLUTION | Freq: Once | RESPIRATORY_TRACT | Status: AC
  Administered 2012-04-24: 5 mg via RESPIRATORY_TRACT
  Filled 2012-04-24: qty 1

## 2012-04-24 MED ORDER — ONDANSETRON HCL 4 MG/2ML IJ SOLN
4.0000 mg | Freq: Once | INTRAMUSCULAR | Status: AC
  Administered 2012-04-24: 4 mg via INTRAVENOUS
  Filled 2012-04-24: qty 2

## 2012-04-24 MED ORDER — ALBUTEROL SULFATE HFA 108 (90 BASE) MCG/ACT IN AERS
2.0000 | INHALATION_SPRAY | RESPIRATORY_TRACT | Status: DC | PRN
  Administered 2012-04-24: 2 via RESPIRATORY_TRACT
  Filled 2012-04-24: qty 6.7

## 2012-04-24 MED ORDER — PREDNISONE 10 MG PO TABS: 20.0000 mg | ORAL_TABLET | Freq: Every day | ORAL | Status: DC

## 2012-04-24 MED ORDER — HYDROCODONE-ACETAMINOPHEN 5-500 MG PO TABS: 1.0000 | ORAL_TABLET | Freq: Four times a day (QID) | ORAL | Status: DC | PRN

## 2012-04-24 MED ORDER — MORPHINE SULFATE 4 MG/ML IJ SOLN
4.0000 mg | Freq: Once | INTRAMUSCULAR | Status: AC
  Administered 2012-04-24: 4 mg via INTRAVENOUS
  Filled 2012-04-24: qty 1

## 2012-04-24 MED ORDER — AZITHROMYCIN 250 MG PO TABS: 250.0000 mg | ORAL_TABLET | Freq: Every day | ORAL | Status: DC

## 2012-04-24 NOTE — ED Provider Notes (Signed)
History     CSN: [id number]  Arrival date & time 04/24/12  0807   First MD Initiated Contact with Patient 04/24/12 0827      Chief Complaint  Patient presents with  . Abdominal Pain  . Chest Pain  . Fever    (Consider location/radiation/quality/duration/timing/severity/associated sxs/prior treatment) HPI Comments: Patient presents today with cough congestion and chest soreness the last 2-3 days. She states her symptoms are becoming worse to the last 2 days. She was seen here approximately one month ago and was diagnosed with a questionable pneumonia. She states that she feels like the pneumonia might be getting worse. She feels congested in her chest. She's been coughing. Her cough is mildly productive. She woke up this morning and felt like she was running a fever. She's been feeling achy all over. She has soreness across the chest which she says is from the coughing. She is also sore across her upper abdomen which also started after the coughing and breathing treatment that she's at home. She uses an albuterol MDI at home. She is up this morning without relief. She does complain of some wheezing but she denies feeling short of breath. She denies any leg pain or swelling. Denies any UTI symptoms.  Patient is a 61 y.o. female presenting with abdominal pain, chest pain, and fever.  Abdominal Pain The primary symptoms of the illness include abdominal pain, fever, fatigue and shortness of breath. The primary symptoms of the illness do not include nausea, vomiting or diarrhea.  Symptoms associated with the illness do not include chills, diaphoresis, constipation, hematuria or frequency.  Chest Pain Primary symptoms include a fever, fatigue, shortness of breath, cough and abdominal pain. Pertinent negatives for primary symptoms include no nausea, no vomiting and no dizziness.  Associated symptoms include weakness (generalized).  Pertinent negatives for associated symptoms include no diaphoresis  and no numbness.    Fever Primary symptoms of the febrile illness include fever, fatigue, cough, shortness of breath, abdominal pain and myalgias. Primary symptoms do not include headaches, nausea, vomiting, diarrhea or rash.  The myalgias are associated with weakness (generalized).    Past Medical History  Diagnosis Date  . Asthma   . Hypertension   . GERD (gastroesophageal reflux disease)   . A-fib     Past Surgical History  Procedure Date  . Tubal ligation     Family History  Problem Relation Age of Onset  . Cancer Mother   . Cancer Father   . Heart attack Mother 55    History  Substance Use Topics  . Smoking status: Former Smoker    Quit date: 11/07/2011  . Smokeless tobacco: Never Used  . Alcohol Use: No    OB History    Grav Para Term Preterm Abortions TAB SAB Ect Mult Living                  Review of Systems  Constitutional: Positive for fever and fatigue. Negative for chills and diaphoresis.  HENT: Negative for congestion, rhinorrhea and sneezing.   Eyes: Negative.   Respiratory: Positive for cough and shortness of breath. Negative for chest tightness.   Cardiovascular: Positive for chest pain. Negative for leg swelling.  Gastrointestinal: Positive for abdominal pain. Negative for nausea, vomiting, diarrhea, constipation and blood in stool.  Genitourinary: Negative for frequency, hematuria, flank pain and difficulty urinating.  Musculoskeletal: Positive for myalgias.  Skin: Negative for rash.  Neurological: Positive for weakness (generalized). Negative for dizziness, speech difficulty, numbness and headaches.  Allergies  Celecoxib  Home Medications   Current Outpatient Rx  Name Route Sig Dispense Refill  . ALBUTEROL SULFATE HFA 108 (90 BASE) MCG/ACT IN AERS Inhalation Inhale 2 puffs into the lungs every 6 (six) hours as needed. For shortness of breath    . ALBUTEROL SULFATE (2.5 MG/3ML) 0.083% IN NEBU Nebulization Take 2.5 mg by nebulization  every 6 (six) hours as needed. Shortness of breath    . DILTIAZEM HCL ER BEADS 180 MG PO CP24 Oral Take 180 mg by mouth daily.    Marland Kitchen TIOTROPIUM BROMIDE MONOHYDRATE 18 MCG IN CAPS Inhalation Place 18 mcg into inhaler and inhale daily.    . AZITHROMYCIN 250 MG PO TABS Oral Take 1 tablet (250 mg total) by mouth daily. Take first 2 tablets together, then 1 every day until finished. 6 tablet 0  . HYDROCODONE-ACETAMINOPHEN 5-500 MG PO TABS Oral Take 1-2 tablets by mouth every 6 (six) hours as needed for pain. 10 tablet 0  . PREDNISONE 10 MG PO TABS Oral Take 2 tablets (20 mg total) by mouth daily. 10 tablet 0    BP 136/98  Pulse 70  Temp 98.1 F (36.7 C) (Oral)  Resp 16  SpO2 98%  Physical Exam  Constitutional: She is oriented to person, place, and time. She appears well-developed and well-nourished.  HENT:  Head: Normocephalic and atraumatic.  Mouth/Throat: Oropharynx is clear and moist.  Eyes: Pupils are equal, round, and reactive to light.  Neck: Normal range of motion. Neck supple.  Cardiovascular: Normal rate, regular rhythm and normal heart sounds.   Pulmonary/Chest: Effort normal. No respiratory distress. She has wheezes (mild expiratory wheezes bilaterally). She has no rales. She exhibits tenderness (diffuse tenderness on palpation across chest and upper abdomen).  Abdominal: Soft. Bowel sounds are normal. There is tenderness. There is no rebound and no guarding.  Musculoskeletal: Normal range of motion. She exhibits no edema.  Lymphadenopathy:    She has no cervical adenopathy.  Neurological: She is alert and oriented to person, place, and time.  Skin: Skin is warm and dry. No rash noted.  Psychiatric: She has a normal mood and affect.    ED Course  Procedures (including critical care time)  Results for orders placed during the hospital encounter of 04/24/12  CBC WITH DIFFERENTIAL      Component Value Range   WBC 5.0  4.0 - 10.5 K/uL   RBC 5.28 (*) 3.87 - 5.11 MIL/uL    Hemoglobin 14.4  12.0 - 15.0 g/dL   HCT 86.5  78.4 - 69.6 %   MCV 79.7  78.0 - 100.0 fL   MCH 27.3  26.0 - 34.0 pg   MCHC 34.2  30.0 - 36.0 g/dL   RDW 29.5  28.4 - 13.2 %   Platelets 211  150 - 400 K/uL   Neutrophils Relative 44  43 - 77 %   Neutro Abs 2.2  1.7 - 7.7 K/uL   Lymphocytes Relative 26  12 - 46 %   Lymphs Abs 1.3  0.7 - 4.0 K/uL   Monocytes Relative 9  3 - 12 %   Monocytes Absolute 0.4  0.1 - 1.0 K/uL   Eosinophils Relative 21 (*) 0 - 5 %   Eosinophils Absolute 1.1 (*) 0.0 - 0.7 K/uL   Basophils Relative 0  0 - 1 %   Basophils Absolute 0.0  0.0 - 0.1 K/uL  COMPREHENSIVE METABOLIC PANEL      Component Value Range   Sodium 140  135 - 145 mEq/L   Potassium 3.2 (*) 3.5 - 5.1 mEq/L   Chloride 103  96 - 112 mEq/L   CO2 24  19 - 32 mEq/L   Glucose, Bld 88  70 - 99 mg/dL   BUN 10  6 - 23 mg/dL   Creatinine, Ser 1.61  0.50 - 1.10 mg/dL   Calcium 09.6  8.4 - 04.5 mg/dL   Total Protein 8.2  6.0 - 8.3 g/dL   Albumin 4.4  3.5 - 5.2 g/dL   AST 23  0 - 37 U/L   ALT 24  0 - 35 U/L   Alkaline Phosphatase 111  39 - 117 U/L   Total Bilirubin 0.4  0.3 - 1.2 mg/dL   GFR calc non Af Amer >90  >90 mL/min   GFR calc Af Amer >90  >90 mL/min  LIPASE, BLOOD      Component Value Range   Lipase 23  11 - 59 U/L  URINALYSIS, ROUTINE W REFLEX MICROSCOPIC      Component Value Range   Color, Urine YELLOW  YELLOW   APPearance CLOUDY (*) CLEAR   Specific Gravity, Urine 1.019  1.005 - 1.030   pH 6.0  5.0 - 8.0   Glucose, UA NEGATIVE  NEGATIVE mg/dL   Hgb urine dipstick MODERATE (*) NEGATIVE   Bilirubin Urine NEGATIVE  NEGATIVE   Ketones, ur NEGATIVE  NEGATIVE mg/dL   Protein, ur 409 (*) NEGATIVE mg/dL   Urobilinogen, UA 0.2  0.0 - 1.0 mg/dL   Nitrite NEGATIVE  NEGATIVE   Leukocytes, UA NEGATIVE  NEGATIVE  TROPONIN I      Component Value Range   Troponin I <0.30  <0.30 ng/mL  URINE MICROSCOPIC-ADD ON      Component Value Range   Squamous Epithelial / LPF RARE  RARE   WBC, UA 0-2  <3  WBC/hpf   RBC / HPF 7-10  <3 RBC/hpf   Bacteria, UA RARE  RARE   Dg Chest 2 View  04/24/2012  *RADIOLOGY REPORT*  Clinical Data: Cough and weakness.  CHEST - 2 VIEW  Comparison: 04/07/2012  Findings: Mild hyperinflation with interstitial thickening. Midline trachea.  Normal heart size and mediastinal contours. No pleural effusion or pneumothorax.  Clear lungs.  IMPRESSION: COPD/chronic bronchitis. No acute superimposed process.   Original Report Authenticated By: Consuello Bossier, M.D.     Date: 04/24/2012  Rate: 95  Rhythm: normal sinus rhythm  QRS Axis: normal  Intervals: normal  ST/T Wave abnormalities: nonspecific ST/T changes  Conduction Disutrbances:none  Narrative Interpretation:   Old EKG Reviewed: unchanged      1. Bronchitis       MDM  Pt feels much better after her breathing treatment.  Was given IVFs.  abd nontender on reexam.  No ischemic changes on EKG or symptoms concerning for ACS/PE.  likley bronchitis.  No pneumonia on CXR.  Pt was given a rx for abx on last visit, but did not get them filled.  Will tx with z-pack, prednisone, given albuterol MDI.  F/u with her PMD or return for any worsening symptoms.        Rolan Bucco, MD 04/24/12 [phone]

## 2012-04-24 NOTE — ED Notes (Signed)
To ED for eval of fever, abd pain, and cp for the past 2 days.

## 2012-04-25 ENCOUNTER — Emergency Department (HOSPITAL_COMMUNITY): Payer: Self-pay

## 2012-04-25 ENCOUNTER — Inpatient Hospital Stay (HOSPITAL_COMMUNITY)
Admission: EM | Admit: 2012-04-25 | Discharge: 2012-04-27 | DRG: 309 | Disposition: A | Discharge status: Home / Self Care | Payer: MEDICAID | Attending: Internal Medicine | Admitting: Internal Medicine

## 2012-04-25 ENCOUNTER — Encounter (HOSPITAL_COMMUNITY): Payer: Self-pay | Admitting: Emergency Medicine

## 2012-04-25 ENCOUNTER — Inpatient Hospital Stay (HOSPITAL_COMMUNITY): Payer: Self-pay

## 2012-04-25 DIAGNOSIS — J45901 Unspecified asthma with (acute) exacerbation: Secondary | ICD-10-CM

## 2012-04-25 DIAGNOSIS — E875 Hyperkalemia: Secondary | ICD-10-CM | POA: Diagnosis present

## 2012-04-25 DIAGNOSIS — J45909 Unspecified asthma, uncomplicated: Secondary | ICD-10-CM

## 2012-04-25 DIAGNOSIS — R000 Tachycardia, unspecified: Secondary | ICD-10-CM

## 2012-04-25 DIAGNOSIS — J9600 Acute respiratory failure, unspecified whether with hypoxia or hypercapnia: Secondary | ICD-10-CM

## 2012-04-25 DIAGNOSIS — Z79899 Other long term (current) drug therapy: Secondary | ICD-10-CM

## 2012-04-25 DIAGNOSIS — Z87891 Personal history of nicotine dependence: Secondary | ICD-10-CM

## 2012-04-25 DIAGNOSIS — I10 Essential (primary) hypertension: Secondary | ICD-10-CM | POA: Diagnosis present

## 2012-04-25 DIAGNOSIS — R002 Palpitations: Secondary | ICD-10-CM | POA: Diagnosis present

## 2012-04-25 DIAGNOSIS — J189 Pneumonia, unspecified organism: Secondary | ICD-10-CM

## 2012-04-25 DIAGNOSIS — K219 Gastro-esophageal reflux disease without esophagitis: Secondary | ICD-10-CM | POA: Insufficient documentation

## 2012-04-25 DIAGNOSIS — I4891 Unspecified atrial fibrillation: Principal | ICD-10-CM | POA: Diagnosis present

## 2012-04-25 DIAGNOSIS — E876 Hypokalemia: Secondary | ICD-10-CM

## 2012-04-25 DIAGNOSIS — R634 Abnormal weight loss: Secondary | ICD-10-CM

## 2012-04-25 LAB — CBC WITH DIFFERENTIAL/PLATELET
Basophils Absolute: 0 10*3/uL (ref 0.0–0.1)
Eosinophils Relative: 18 % — ABNORMAL HIGH (ref 0–5)
Lymphocytes Relative: 37 % (ref 12–46)
Lymphs Abs: 2.2 10*3/uL (ref 0.7–4.0)
MCV: 80.3 fL (ref 78.0–100.0)
Neutrophils Relative %: 35 % — ABNORMAL LOW (ref 43–77)
Platelets: 246 10*3/uL (ref 150–400)
RBC: 5.34 MIL/uL — ABNORMAL HIGH (ref 3.87–5.11)
RDW: 14.7 % (ref 11.5–15.5)
WBC: 5.9 10*3/uL (ref 4.0–10.5)

## 2012-04-25 LAB — BASIC METABOLIC PANEL
CO2: 26 mEq/L (ref 19–32)
Calcium: 10.3 mg/dL (ref 8.4–10.5)
GFR calc non Af Amer: 88 mL/min — ABNORMAL LOW (ref >90)
Potassium: 5.5 mEq/L — ABNORMAL HIGH (ref 3.5–5.1)
Sodium: 140 mEq/L (ref 135–145)

## 2012-04-25 LAB — URINE MICROSCOPIC-ADD ON

## 2012-04-25 LAB — PROTIME-INR
INR: 0.94 (ref 0.00–1.49)
Prothrombin Time: 12.8 seconds (ref 11.6–15.2)

## 2012-04-25 LAB — D-DIMER, QUANTITATIVE: D-Dimer, Quant: 0.95 ug/mL-FEU — ABNORMAL HIGH (ref 0.00–0.48)

## 2012-04-25 LAB — URINALYSIS, ROUTINE W REFLEX MICROSCOPIC
Glucose, UA: NEGATIVE mg/dL
Ketones, ur: NEGATIVE mg/dL
Protein, ur: 30 mg/dL — AB
Urobilinogen, UA: 0.2 mg/dL (ref 0.0–1.0)

## 2012-04-25 MED ORDER — ACETAMINOPHEN 650 MG RE SUPP: 650.0000 mg | Freq: Four times a day (QID) | RECTAL | Status: DC | PRN

## 2012-04-25 MED ORDER — ENOXAPARIN SODIUM 40 MG/0.4ML ~~LOC~~ SOLN
40.0000 mg | SUBCUTANEOUS | Status: DC
  Administered 2012-04-26 – 2012-04-27 (×2): 40 mg via SUBCUTANEOUS
  Filled 2012-04-25 (×2): qty 0.4

## 2012-04-25 MED ORDER — IPRATROPIUM BROMIDE 0.02 % IN SOLN
0.5000 mg | Freq: Four times a day (QID) | RESPIRATORY_TRACT | Status: DC
  Administered 2012-04-25 – 2012-04-26 (×3): 0.5 mg via RESPIRATORY_TRACT
  Filled 2012-04-25 (×3): qty 2.5

## 2012-04-25 MED ORDER — ASPIRIN EC 325 MG PO TBEC
325.0000 mg | DELAYED_RELEASE_TABLET | Freq: Every day | ORAL | Status: DC
  Administered 2012-04-26 – 2012-04-27 (×2): 325 mg via ORAL
  Filled 2012-04-25 (×2): qty 1

## 2012-04-25 MED ORDER — LEVALBUTEROL HCL 1.25 MG/0.5ML IN NEBU
1.2500 mg | INHALATION_SOLUTION | RESPIRATORY_TRACT | Status: DC | PRN
  Administered 2012-04-26: 1.25 mg via RESPIRATORY_TRACT
  Filled 2012-04-25: qty 0.5

## 2012-04-25 MED ORDER — METHYLPREDNISOLONE SODIUM SUCC 125 MG IJ SOLR
80.0000 mg | Freq: Four times a day (QID) | INTRAMUSCULAR | Status: DC
  Administered 2012-04-26 (×2): 80 mg via INTRAVENOUS
  Filled 2012-04-25 (×3): qty 1.28
  Filled 2012-04-25 (×2): qty 2
  Filled 2012-04-25: qty 1.28
  Filled 2012-04-25: qty 2

## 2012-04-25 MED ORDER — DILTIAZEM HCL 100 MG IV SOLR
5.0000 mg/h | INTRAVENOUS | Status: DC
  Administered 2012-04-25 – 2012-04-26 (×3): 10 mg/h via INTRAVENOUS
  Filled 2012-04-25: qty 100

## 2012-04-25 MED ORDER — GUAIFENESIN ER 600 MG PO TB12
600.0000 mg | ORAL_TABLET | Freq: Two times a day (BID) | ORAL | Status: DC
  Administered 2012-04-26 – 2012-04-27 (×4): 600 mg via ORAL
  Filled 2012-04-25 (×5): qty 1

## 2012-04-25 MED ORDER — ONDANSETRON HCL 4 MG PO TABS
4.0000 mg | ORAL_TABLET | Freq: Four times a day (QID) | ORAL | Status: DC | PRN
  Administered 2012-04-27: 4 mg via ORAL
  Filled 2012-04-25: qty 1

## 2012-04-25 MED ORDER — ONDANSETRON HCL 4 MG/2ML IJ SOLN: 4.0000 mg | Freq: Three times a day (TID) | INTRAMUSCULAR | Status: AC | PRN

## 2012-04-25 MED ORDER — SODIUM CHLORIDE 0.9 % IJ SOLN
3.0000 mL | Freq: Two times a day (BID) | INTRAMUSCULAR | Status: DC
  Administered 2012-04-26 – 2012-04-27 (×2): 3 mL via INTRAVENOUS

## 2012-04-25 MED ORDER — SODIUM CHLORIDE 0.9 % IV SOLN
INTRAVENOUS | Status: DC
  Administered 2012-04-25: 23:00:00 via INTRAVENOUS
  Administered 2012-04-26: 1000 mL via INTRAVENOUS

## 2012-04-25 MED ORDER — ONDANSETRON HCL 4 MG/2ML IJ SOLN
4.0000 mg | Freq: Four times a day (QID) | INTRAMUSCULAR | Status: DC | PRN
Start: 2012-04-25 — End: 2012-04-27

## 2012-04-25 MED ORDER — SODIUM CHLORIDE 0.9 % IV SOLN
INTRAVENOUS | Status: DC
  Administered 2012-04-25: 20:00:00 via INTRAVENOUS

## 2012-04-25 MED ORDER — IOHEXOL 350 MG/ML SOLN
100.0000 mL | Freq: Once | INTRAVENOUS | Status: AC | PRN
  Administered 2012-04-25: 100 mL via INTRAVENOUS

## 2012-04-25 MED ORDER — SODIUM CHLORIDE 0.9 % IV BOLUS (SEPSIS)
1000.0000 mL | Freq: Once | INTRAVENOUS | Status: AC
  Administered 2012-04-25: 1000 mL via INTRAVENOUS

## 2012-04-25 MED ORDER — DILTIAZEM HCL 25 MG/5ML IV SOLN
10.0000 mg | Freq: Once | INTRAVENOUS | Status: AC
  Administered 2012-04-25: 10 mg via INTRAVENOUS

## 2012-04-25 MED ORDER — LEVALBUTEROL HCL 0.63 MG/3ML IN NEBU
0.6300 mg | INHALATION_SOLUTION | Freq: Four times a day (QID) | RESPIRATORY_TRACT | Status: DC
  Administered 2012-04-25 – 2012-04-27 (×7): 0.63 mg via RESPIRATORY_TRACT
  Filled 2012-04-25 (×13): qty 3

## 2012-04-25 MED ORDER — SODIUM POLYSTYRENE SULFONATE 15 GM/60ML PO SUSP
15.0000 g | Freq: Once | ORAL | Status: AC
  Administered 2012-04-25: 15 g via ORAL
  Filled 2012-04-25: qty 60

## 2012-04-25 MED ORDER — METOPROLOL TARTRATE 1 MG/ML IV SOLN
5.0000 mg | Freq: Once | INTRAVENOUS | Status: AC
  Administered 2012-04-25: 5 mg via INTRAVENOUS
  Filled 2012-04-25: qty 5

## 2012-04-25 MED ORDER — ACETAMINOPHEN 325 MG PO TABS
650.0000 mg | ORAL_TABLET | Freq: Four times a day (QID) | ORAL | Status: DC | PRN
  Administered 2012-04-26 – 2012-04-27 (×4): 650 mg via ORAL
  Filled 2012-04-25 (×3): qty 2

## 2012-04-25 MED ORDER — MOXIFLOXACIN HCL IN NACL 400 MG/250ML IV SOLN
400.0000 mg | INTRAVENOUS | Status: DC
  Administered 2012-04-25: 400 mg via INTRAVENOUS
  Filled 2012-04-25 (×2): qty 250

## 2012-04-25 NOTE — ED Notes (Signed)
Pt talking with faily on the phone. No shob noted.  meds given per order.

## 2012-04-25 NOTE — ED Notes (Signed)
Pt using Bedpan without assistance. Denies any pain. States she does feel shob at times after movement

## 2012-04-25 NOTE — ED Notes (Signed)
Pt sts some palpitations starting after taking breathing treatment; pt sts hx of same

## 2012-04-25 NOTE — Progress Notes (Signed)
Disposition Note  Quantasia Stegner, is a 61 y.o. female,   MRN: [id number]  -  DOB - Jan 03, 1951  Outpatient Primary MD for the patient is SHAW,EVA, MD   Blood pressure 121/82, pulse 86, temperature 98.4 F (36.9 C), temperature source Oral, resp. rate 19, SpO2 100.00%.  Active Problems:  Asthmatic bronchitis with exacerbation  Atrial fibrillation with rapid ventricular response  Community acquired pneumonia  HYPERTENSION  Hyperkalemia   61 yo female who presented to the ED yesterday and was found to have bronchitis.  She returns today with palpitations and chest tightness, SOB.   Pulse rate is very elevated and not controlled with metoprolol and one time cardizem  in the ED.  She is now on a cardizem drip  In the ED she is found to have an elevated D-Dimer (.95)  CT Angio is pending.  K is 5.5.  She has had Atrial fibrillation in the past brought on by Albuterol.  I have requested a step down bed to Afib with RVR, Asthma exacerbation, and hyperkalemia.  CT Angio chest is pending.  Algis Downs, PA-C [facility] Pager: [phone]

## 2012-04-25 NOTE — ED Notes (Signed)
Attempted to call report x1, stated they would call back 

## 2012-04-25 NOTE — ED Notes (Addendum)
No need for Bld cx per MD  Antibiotics started

## 2012-04-25 NOTE — ED Notes (Signed)
Pt to BR via WC. Pt very shob on arrival back to room. Pt states she gets shob with exertion.  Husband at bedside.  No pain at present

## 2012-04-25 NOTE — ED Notes (Signed)
Lunch box given. Pt states she is feeling much better than when she came in.  Awaiting room assignment

## 2012-04-25 NOTE — ED Notes (Signed)
hospitalist here to evaluate pt

## 2012-04-25 NOTE — ED Notes (Signed)
Pt in CT.

## 2012-04-25 NOTE — ED Provider Notes (Signed)
History     CSN: [id number]  Arrival date & time 04/25/12  1100   First MD Initiated Contact with Patient 04/25/12 1118      Chief Complaint  Patient presents with  . Palpitations    (Consider location/radiation/quality/duration/timing/severity/associated sxs/prior treatment) HPI Comments: Patient presents from home with palpitations that started this morning after using a breathing treatment. She seen in the ED yesterday for bronchitis. She was discharged on albuterol and azithromycin. She endorses some chest tightness and shortness of breath. No cough or fever. She denies any history of fibrillation though record review shows she had in the past.  The history is provided by the patient.    Past Medical History  Diagnosis Date  . Asthma   . Hypertension   . GERD (gastroesophageal reflux disease)   . A-fib     Past Surgical History  Procedure Date  . Tubal ligation     Family History  Problem Relation Age of Onset  . Cancer Mother   . Cancer Father   . Heart attack Mother 42    History  Substance Use Topics  . Smoking status: Former Smoker    Quit date: 11/07/2011  . Smokeless tobacco: Never Used  . Alcohol Use: No    OB History    Grav Para Term Preterm Abortions TAB SAB Ect Mult Living                  Review of Systems  Constitutional: Negative for fever, activity change and appetite change.  Respiratory: Positive for chest tightness and shortness of breath. Negative for cough.   Cardiovascular: Positive for palpitations.  Gastrointestinal: Negative for nausea, vomiting and abdominal pain.  Genitourinary: Negative for dysuria.  Musculoskeletal: Negative for back pain.  Neurological: Negative for dizziness and headaches.    Allergies  Celecoxib  Home Medications   Current Outpatient Rx  Name Route Sig Dispense Refill  . ALBUTEROL SULFATE HFA 108 (90 BASE) MCG/ACT IN AERS Inhalation Inhale 2 puffs into the lungs every 6 (six) hours as needed. For  shortness of breath    . ALBUTEROL SULFATE (2.5 MG/3ML) 0.083% IN NEBU Nebulization Take 2.5 mg by nebulization every 6 (six) hours as needed. Shortness of breath    . DILTIAZEM HCL ER BEADS 180 MG PO CP24 Oral Take 180 mg by mouth daily.    Marland Kitchen TIOTROPIUM BROMIDE MONOHYDRATE 18 MCG IN CAPS Inhalation Place 18 mcg into inhaler and inhale daily.      BP 139/96  Pulse 80  Temp 98.4 F (36.9 C) (Oral)  Resp 20  SpO2 99%  Physical Exam  Constitutional: She is oriented to person, place, and time. She appears well-developed and well-nourished. No distress.  HENT:  Head: Normocephalic and atraumatic.  Mouth/Throat: Oropharynx is clear and moist. No oropharyngeal exudate.  Eyes: Conjunctivae are normal. Pupils are equal, round, and reactive to light.  Neck: Normal range of motion. Neck supple.  Cardiovascular: Normal rate, regular rhythm and normal heart sounds.   No murmur heard.      Irregular rhythm  Pulmonary/Chest: Effort normal. No respiratory distress.       Poor air movement bilatereally.  Abdominal: Soft. There is no tenderness. There is no rebound and no guarding.  Musculoskeletal: Normal range of motion. She exhibits no edema and no tenderness.  Neurological: She is alert and oriented to person, place, and time. No cranial nerve deficit.  Skin: Skin is warm.    ED Course  Procedures (including critical care  time)  Labs Reviewed  CBC WITH DIFFERENTIAL - Abnormal; Notable for the following:    RBC 5.34 (*)     Neutrophils Relative 35 (*)     Eosinophils Relative 18 (*)     Eosinophils Absolute 1.1 (*)     All other components within normal limits  BASIC METABOLIC PANEL - Abnormal; Notable for the following:    Potassium 5.5 (*)  HEMOLYSIS AT THIS LEVEL MAY AFFECT RESULT   Glucose, Bld 100 (*)     GFR calc non Af Amer 88 (*)     All other components within normal limits  D-DIMER, QUANTITATIVE - Abnormal; Notable for the following:    D-Dimer, Quant 0.95 (*)     All other  components within normal limits  URINALYSIS, ROUTINE W REFLEX MICROSCOPIC - Abnormal; Notable for the following:    Hgb urine dipstick SMALL (*)     Protein, ur 30 (*)     All other components within normal limits  PROTIME-INR  TROPONIN I  URINE MICROSCOPIC-ADD ON   Dg Chest 2 View  04/25/2012  *RADIOLOGY REPORT*  Clinical Data: Wheezing, history of asthma, chest pain  CHEST - 2 VIEW  Comparison: 04/24/2012; 04/07/2012; 02/23/2012; chest CT - 03/04/2011  Findings: Unchanged cardiac silhouette and mediastinal contours. The lungs remain hyperexpanded with flattening of bilateral hemidiaphragms. Electrical cardiac monitor stickers overlying the bilateral lung apices and left lateral chest wall.  No focal airspace opacities.  No pleural effusion or pneumothorax. Unchanged bones.  IMPRESSION: Hyperexpanded lungs without acute cardiopulmonary disease.   Original Report Authenticated By: Waynard Reeds, M.D.    Dg Chest 2 View  04/24/2012  *RADIOLOGY REPORT*  Clinical Data: Cough and weakness.  CHEST - 2 VIEW  Comparison: 04/07/2012  Findings: Mild hyperinflation with interstitial thickening. Midline trachea.  Normal heart size and mediastinal contours. No pleural effusion or pneumothorax.  Clear lungs.  IMPRESSION: COPD/chronic bronchitis. No acute superimposed process.   Original Report Authenticated By: Consuello Bossier, M.D.      1. Atrial fibrillation with rapid ventricular response       MDM  Atrial fibrillation with RVR, superimposed asthma exacerbation/bronchitis.   IV cardizem for rate control, IVF, CXR, steroids, xopenex.   HR improved to 80s-90s on cardizem, afib remains.  Patient consider for heparin in past but could not afford it and didn't have followup.  CHADS 1  Admission d/witth [place].      Date: 04/25/2012  Rate: 174  Rhythm: atrial fibrillation  QRS Axis: normal  Intervals: normal  ST/T Wave abnormalities: normal  Conduction Disutrbances:none  Narrative  Interpretation:   Old EKG Reviewed: changes noted    Glynn Octave, MD 04/25/12 1913

## 2012-04-25 NOTE — H&P (Addendum)
[facility]          History and Physical    PCP:  none   Chief Complaint:  palpitations  HPI: [name] is a 61 year old female with history of asthma, chronic bronchitis, paroxysmal atrial fibrillation presents to the emergency room today with the above complaints. She reports chest congestion, dry cough and wheezing off and on for the past couple of weeks, went to the emergency room yesterday, also had a low-grade fever at the time was discharged home on antibiotics and prednisone. She woke up this morning with wheezing and dry cough, had an albuterol neb treatment, subsequently started experiencing chest palpitations and feeling unwell and presented to the emergency room. She denies any chest pain except for some pain in the right side of her chest when she coughs,  Hasn't had a chance to fill any of her prescriptions given last night. Similar presentation in end of May, initially with A. fib/RVR, seen by the [facility] power cardiology recommended to start Coumadin, however due to cost associated with followup and no PCP to follow up her anti-coagulation she was discharged home on full dose aspirin. Upon evaluation the emergency room, noted to have atrial fibrillation w/ RVR with HR in 150s, was treated with a bolus of Cardizem and subsequently started on a Cardizem drip, also noted to have hyperexpanded lungs on x-ray, hyperkalemia, just back from a CT Angio of her chest results of which are still pending   Allergies:   Allergies  Allergen Reactions  . Celecoxib Rash      Past Medical History  Diagnosis Date  . Asthma   . Hypertension   . GERD (gastroesophageal reflux disease)   . A-fib     Past Surgical History  Procedure Date  . Tubal ligation     Prior to Admission medications   Medication Sig Start Date End Date Taking? Authorizing Provider  albuterol (PROVENTIL HFA;VENTOLIN HFA) 108 (90 BASE) MCG/ACT inhaler Inhale 2 puffs into the lungs every 6  (six) hours as needed. For shortness of breath   Yes Historical Provider, MD  albuterol (PROVENTIL) (2.5 MG/3ML) 0.083% nebulizer solution Take 2.5 mg by nebulization every 6 (six) hours as needed. Shortness of breath   Yes Historical Provider, MD  diltiazem (TIAZAC) 180 MG 24 hr capsule Take 180 mg by mouth daily.   Yes Historical Provider, MD  tiotropium (SPIRIVA) 18 MCG inhalation capsule Place 18 mcg into inhaler and inhale daily.   Yes Historical Provider, MD    Social History:  reports that she quit smoking about 5 months ago. She has never used smokeless tobacco. She reports that she uses illicit drugs (Marijuana). She reports that she does not drink alcohol.  Family History  Problem Relation Age of Onset  . Cancer Mother   . Cancer Father   . Heart attack Mother 50    Review of Systems:  Constitutional: Denies fever, chills, diaphoresis, appetite change and fatigue.  HEENT: Denies photophobia, eye pain, redness, hearing loss, ear pain, congestion, sore throat, rhinorrhea, sneezing, mouth sores, trouble swallowing, neck pain, neck stiffness and tinnitus.   Respiratory: Denies SOB, DOE, cough, chest tightness,  and wheezing.   Cardiovascular: Denies chest pain, palpitations and leg swelling.  Gastrointestinal: Denies nausea, vomiting, abdominal pain, diarrhea, constipation, blood in stool and abdominal distention.  Genitourinary: Denies dysuria, urgency, frequency, hematuria, flank pain and difficulty urinating.  Musculoskeletal: Denies myalgias, back pain, joint swelling, arthralgias and gait problem.  Skin: Denies pallor, rash and wound.  Neurological: Denies dizziness, seizures, syncope, weakness, light-headedness, numbness and headaches.  Hematological: Denies adenopathy. Easy bruising, personal or family bleeding history  Psychiatric/Behavioral: Denies suicidal ideation, mood changes, confusion, nervousness, sleep disturbance and agitation   Physical Exam: Blood pressure  139/96, pulse 80, temperature 98.4 F (36.9 C), temperature source Oral, resp. rate 20, SpO2 99.00%. Gen: Alert awake oriented x3 in no distress HEENT: Pupils equal reactive, oral mucosa moist, poor dental hygiene CVS: S1-S2 regular rate rhythm converted to sinus rhythm, no murmurs rubs or gallops Lungs: Poor air movement bilaterally, conducted rhonchi in the upper lung zones Abdomen soft nontender with normal bowel sounds no organomegaly Extremities no edema , clubbing noted no cyanosis Neuro moves her extremities no localizing signs Skin no rashes, skin breakdown  Psychiatric appropriate mood and affect    Labs  on Admission:  Results for orders placed during the hospital encounter of 04/25/12 (from the past 48 hour(s))  CBC WITH DIFFERENTIAL     Status: Abnormal   Collection Time   04/25/12 11:31 AM      Component Value Range Comment   WBC 5.9  4.0 - 10.5 K/uL    RBC 5.34 (*) 3.87 - 5.11 MIL/uL    Hemoglobin 14.6  12.0 - 15.0 g/dL    HCT 16.1  09.6 - 04.5 %    MCV 80.3  78.0 - 100.0 fL    MCH 27.3  26.0 - 34.0 pg    MCHC 34.0  30.0 - 36.0 g/dL    RDW 40.9  81.1 - 91.4 %    Platelets 246  150 - 400 K/uL    Neutrophils Relative 35 (*) 43 - 77 %    Neutro Abs 2.1  1.7 - 7.7 K/uL    Lymphocytes Relative 37  12 - 46 %    Lymphs Abs 2.2  0.7 - 4.0 K/uL    Monocytes Relative 10  3 - 12 %    Monocytes Absolute 0.6  0.1 - 1.0 K/uL    Eosinophils Relative 18 (*) 0 - 5 %    Eosinophils Absolute 1.1 (*) 0.0 - 0.7 K/uL    Basophils Relative 0  0 - 1 %    Basophils Absolute 0.0  0.0 - 0.1 K/uL   BASIC METABOLIC PANEL     Status: Abnormal   Collection Time   04/25/12 11:31 AM      Component Value Range Comment   Sodium 140  135 - 145 mEq/L    Potassium 5.5 (*) 3.5 - 5.1 mEq/L HEMOLYSIS AT THIS LEVEL MAY AFFECT RESULT   Chloride 102  96 - 112 mEq/L    CO2 26  19 - 32 mEq/L    Glucose, Bld 100 (*) 70 - 99 mg/dL    BUN 13  6 - 23 mg/dL    Creatinine, Ser 7.82  0.50 - 1.10 mg/dL     Calcium 95.6  8.4 - 10.5 mg/dL    GFR calc non Af Amer 88 (*) >90 mL/min    GFR calc Af Amer >90  >90 mL/min   PROTIME-INR     Status: Normal   Collection Time   04/25/12 11:31 AM      Component Value Range Comment   Prothrombin Time 12.8  11.6 - 15.2 seconds    INR 0.94  0.00 - 1.49   D-DIMER, QUANTITATIVE     Status: Abnormal   Collection Time   04/25/12 11:31 AM      Component Value Range Comment  D-Dimer, Quant 0.95 (*) 0.00 - 0.48 ug/mL-FEU   TROPONIN I     Status: Normal   Collection Time   04/25/12 11:32 AM      Component Value Range Comment   Troponin I <0.30  <0.30 ng/mL   URINALYSIS, ROUTINE W REFLEX MICROSCOPIC     Status: Abnormal   Collection Time   04/25/12 11:49 AM      Component Value Range Comment   Color, Urine YELLOW  YELLOW    APPearance CLEAR  CLEAR    Specific Gravity, Urine 1.012  1.005 - 1.030    pH 7.5  5.0 - 8.0    Glucose, UA NEGATIVE  NEGATIVE mg/dL    Hgb urine dipstick SMALL (*) NEGATIVE    Bilirubin Urine NEGATIVE  NEGATIVE    Ketones, ur NEGATIVE  NEGATIVE mg/dL    Protein, ur 30 (*) NEGATIVE mg/dL    Urobilinogen, UA 0.2  0.0 - 1.0 mg/dL    Nitrite NEGATIVE  NEGATIVE    Leukocytes, UA NEGATIVE  NEGATIVE   URINE MICROSCOPIC-ADD ON     Status: Normal   Collection Time   04/25/12 11:49 AM      Component Value Range Comment   Squamous Epithelial / LPF RARE  RARE    WBC, UA 0-2  <3 WBC/hpf    RBC / HPF 0-2  <3 RBC/hpf    Urine-Other AMORPHOUS URATES/PHOSPHATES       Radiological Exams on Admission: Dg Chest 2 View  04/25/2012  *RADIOLOGY REPORT*  Clinical Data: Wheezing, history of asthma, chest pain  CHEST - 2 VIEW  Comparison: 04/24/2012; 04/07/2012; 02/23/2012; chest CT - 03/04/2011  Findings: Unchanged cardiac silhouette and mediastinal contours. The lungs remain hyperexpanded with flattening of bilateral hemidiaphragms. Electrical cardiac monitor stickers overlying the bilateral lung apices and left lateral chest wall.  No focal airspace  opacities.  No pleural effusion or pneumothorax. Unchanged bones.  IMPRESSION: Hyperexpanded lungs without acute cardiopulmonary disease.   Original Report Authenticated By: Waynard Reeds, M.D.    Dg Chest 2 View  04/24/2012  *RADIOLOGY REPORT*  Clinical Data: Cough and weakness.  CHEST - 2 VIEW  Comparison: 04/07/2012  Findings: Mild hyperinflation with interstitial thickening. Midline trachea.  Normal heart size and mediastinal contours. No pleural effusion or pneumothorax.  Clear lungs.  IMPRESSION: COPD/chronic bronchitis. No acute superimposed process.   Original Report Authenticated By: Consuello Bossier, M.D.     Assessment/Plan  1.  Asthmatic bronchitis with exacerbation Start IV Solu-Medrol 80 mg every 6 Xopenex and Atrovent nebulizations every 6 and when necessary Moxifloxacin IV daily Supportive care with oxygen, Mucinex, flutter valve  2. Atrial fibrillation with rapid ventricular response: triggered by resp status and ?albuterol nebs Continue Cardizem drip, transition to short acting Cardizem tomorrow Avoid beta blockers due to ongoing bronchospasm Use atrovent instead of albuterol 2D echo, EF of 55% without valvular disease in 5/13, TSH and free T4 also normal then CHADs2 score is 1, hence did not feel the need for systemic anticoagulation at this point Aspirin 325 mg daily In addition cost and lack of followup would limit options   3. Hyperkalemia: Without hyperacute T. Waves Kayexalate , lev-albuterol should help , repeat B. met this evening   4. history of hypertension: Blood pressures improved at this point, will monitor on Cardizem drip for now  FULL CODE   Time Spent on Admission:  Tracey Hermance [facility] Pager: [phone] 04/25/2012, 3:45 PM

## 2012-04-25 NOTE — ED Notes (Signed)
Disregard previous note.  Wrong chart.

## 2012-04-25 NOTE — ED Notes (Signed)
TOV Dr Rancour/Bridget Westbrooks Alla Feeling RN  Stepdown bed request not Tele

## 2012-04-26 DIAGNOSIS — E875 Hyperkalemia: Secondary | ICD-10-CM

## 2012-04-26 DIAGNOSIS — I10 Essential (primary) hypertension: Secondary | ICD-10-CM

## 2012-04-26 DIAGNOSIS — I4891 Unspecified atrial fibrillation: Principal | ICD-10-CM

## 2012-04-26 DIAGNOSIS — J45901 Unspecified asthma with (acute) exacerbation: Secondary | ICD-10-CM

## 2012-04-26 LAB — COMPREHENSIVE METABOLIC PANEL
AST: 20 U/L (ref 0–37)
BUN: 7 mg/dL (ref 6–23)
CO2: 24 mEq/L (ref 19–32)
Calcium: 10 mg/dL (ref 8.4–10.5)
Chloride: 106 mEq/L (ref 96–112)
Creatinine, Ser: 0.6 mg/dL (ref 0.50–1.10)
GFR calc Af Amer: >90 mL/min (ref >90)
GFR calc non Af Amer: >90 mL/min (ref >90)
Total Bilirubin: 0.3 mg/dL (ref 0.3–1.2)

## 2012-04-26 LAB — CBC
HCT: 41.4 % (ref 36.0–46.0)
HCT: 40.6 % (ref 36.0–46.0)
Hemoglobin: 14 g/dL (ref 12.0–15.0)
MCH: 27.3 pg (ref 26.0–34.0)
MCH: 26.8 pg (ref 26.0–34.0)
MCHC: 33.8 g/dL (ref 30.0–36.0)
MCV: 81.2 fL (ref 78.0–100.0)
Platelets: 222 10*3/uL (ref 150–400)
RBC: 5.12 MIL/uL — ABNORMAL HIGH (ref 3.87–5.11)
RBC: 5 MIL/uL (ref 3.87–5.11)
WBC: 4.6 10*3/uL (ref 4.0–10.5)

## 2012-04-26 LAB — BASIC METABOLIC PANEL
BUN: 8 mg/dL (ref 6–23)
CO2: 28 mEq/L (ref 19–32)
Calcium: 10 mg/dL (ref 8.4–10.5)
Creatinine, Ser: 0.62 mg/dL (ref 0.50–1.10)
Glucose, Bld: 112 mg/dL — ABNORMAL HIGH (ref 70–99)

## 2012-04-26 MED ORDER — POTASSIUM CHLORIDE CRYS ER 20 MEQ PO TBCR
30.0000 meq | EXTENDED_RELEASE_TABLET | Freq: Once | ORAL | Status: AC
  Administered 2012-04-26: 30 meq via ORAL
  Filled 2012-04-26 (×2): qty 1

## 2012-04-26 MED ORDER — PREDNISONE 20 MG PO TABS
40.0000 mg | ORAL_TABLET | Freq: Every day | ORAL | Status: DC
  Administered 2012-04-27: 40 mg via ORAL
  Filled 2012-04-26 (×2): qty 2

## 2012-04-26 MED ORDER — DILTIAZEM HCL ER BEADS 180 MG PO CP24: 180.0000 mg | ORAL_CAPSULE | Freq: Every day | ORAL | Status: DC

## 2012-04-26 MED ORDER — TIOTROPIUM BROMIDE MONOHYDRATE 18 MCG IN CAPS
18.0000 ug | ORAL_CAPSULE | Freq: Every day | RESPIRATORY_TRACT | Status: DC
  Administered 2012-04-27: 18 ug via RESPIRATORY_TRACT
  Filled 2012-04-26: qty 5

## 2012-04-26 MED ORDER — METHYLPREDNISOLONE SODIUM SUCC 125 MG IJ SOLR
80.0000 mg | Freq: Four times a day (QID) | INTRAMUSCULAR | Status: AC
  Administered 2012-04-26: 80 mg via INTRAVENOUS

## 2012-04-26 MED ORDER — LEVOFLOXACIN 750 MG PO TABS
750.0000 mg | ORAL_TABLET | Freq: Every day | ORAL | Status: DC
  Administered 2012-04-26 – 2012-04-27 (×2): 750 mg via ORAL
  Filled 2012-04-26 (×2): qty 1

## 2012-04-26 MED ORDER — DILTIAZEM HCL ER BEADS 240 MG PO CP24: 240.0000 mg | ORAL_CAPSULE | Freq: Every day | ORAL | Status: DC

## 2012-04-26 MED ORDER — POTASSIUM CHLORIDE CRYS ER 20 MEQ PO TBCR
40.0000 meq | EXTENDED_RELEASE_TABLET | Freq: Once | ORAL | Status: AC
  Administered 2012-04-26: 40 meq via ORAL
  Filled 2012-04-26: qty 2

## 2012-04-26 MED ORDER — DILTIAZEM HCL ER COATED BEADS 240 MG PO CP24
240.0000 mg | ORAL_CAPSULE | Freq: Every day | ORAL | Status: DC
  Administered 2012-04-26 – 2012-04-27 (×2): 240 mg via ORAL
  Filled 2012-04-26 (×3): qty 1

## 2012-04-26 NOTE — Progress Notes (Signed)
NP Craige Cotta was paged regarding potassium level 2.8 await return call with any new orders. [facility] [employer]

## 2012-04-26 NOTE — Progress Notes (Signed)
Subjective: Breathing better.  Denies any chest pain or shortness of breath.  Heart rate controlled currently in sinus rhythm.  Objective: Vital signs in last 24 hours: Filed Vitals:   04/26/12 0000 04/26/12 0158 04/26/12 0400 04/26/12 0600  BP: 153/101  126/79 124/81  Pulse: 80  83 79  Temp: 98 F (36.7 C)  97.9 F (36.6 C)   TempSrc: Oral  Oral   Resp: 25  25 18   Height:      Weight:      SpO2: 92% 94% 93% 93%   Weight change:   Intake/Output Summary (Last 24 hours) at 04/26/12 1052 Last data filed at 04/26/12 0938  Gross per 24 hour  Intake    970 ml  Output    550 ml  Net    420 ml    Physical Exam: General: Awake, Oriented, No acute distress. HEENT: EOMI. Neck: Supple CV: S1 and S2 Lungs: Scattered wheezing bilaterally.  Good respiratory effort. Abdomen: Soft, Nontender, Nondistended, +bowel sounds. Ext: Good pulses. Trace edema.  Lab Results: Basic Metabolic Panel:  Lab 04/26/12 4540 04/26/12 0005 04/25/12 1131 04/24/12 0909  NA 142 145 140 140  K 3.2* 2.8* 5.5* 3.2*  CL 106 105 102 103  CO2 24 28 26 24   GLUCOSE 172* 112* 100* 88  BUN 7 8 13 10   CREATININE 0.60 0.62 0.75 0.65  CALCIUM 10.0 10.0 10.3 10.5  MG -- -- -- --  PHOS -- -- -- --   Liver Function Tests:  Lab 04/26/12 0525 04/24/12 0909  AST 20 23  ALT 19 24  ALKPHOS 92 111  BILITOT 0.3 0.4  PROT 7.4 8.2  ALBUMIN 3.9 4.4    Lab 04/24/12 0909  LIPASE 23  AMYLASE --   No results found for this basename: AMMONIA:5 in the last 168 hours CBC:  Lab 04/26/12 0525 04/26/12 0005 04/25/12 1131 04/24/12 0909  WBC 4.6 5.8 5.9 5.0  NEUTROABS -- -- 2.1 2.2  HGB 13.4 14.0 14.6 14.4  HCT 40.6 41.4 42.9 42.1  MCV 81.2 80.9 80.3 79.7  PLT 222 226 246 211   Cardiac Enzymes:  Lab 04/25/12 1132 04/24/12 0909  CKTOTAL -- --  CKMB -- --  CKMBINDEX -- --  TROPONINI <0.30 <0.30   BNP (last 3 results)  Basename 02/06/12 0931  PROBNP 52.8   CBG: No results found for this basename: GLUCAP:5  in the last 168 hours No results found for this basename: HGBA1C:5 in the last 72 hours Other Labs: No components found with this basename: POCBNP:3  Lab 04/25/12 1131  DDIMER 0.95*   No results found for this basename: CHOL:2,HDL:2,LDLCALC:2,TRIG:2,CHOLHDL:2,LDLDIRECT:2 in the last 168 hours No results found for this basename: TSH,T4TOTAL,FREET3,T3FREE,FREET4,THYROIDAB in the last 168 hours No results found for this basename: VITAMINB12:2,FOLATE:2,FERRITIN:2,TIBC:2,IRON:2,RETICCTPCT:2 in the last 168 hours  Micro Results: Recent Results (from the past 240 hour(s))  MRSA PCR SCREENING     Status: Normal   Collection Time   04/25/12 11:10 PM      Component Value Range Status Comment   MRSA by PCR NEGATIVE  NEGATIVE Final     Studies/Results: Dg Chest 2 View  04/25/2012  *RADIOLOGY REPORT*  Clinical Data: Wheezing, history of asthma, chest pain  CHEST - 2 VIEW  Comparison: 04/24/2012; 04/07/2012; 02/23/2012; chest CT - 03/04/2011  Findings: Unchanged cardiac silhouette and mediastinal contours. The lungs remain hyperexpanded with flattening of bilateral hemidiaphragms. Electrical cardiac monitor stickers overlying the bilateral lung apices and left lateral chest wall.  No focal airspace opacities.  No pleural effusion or pneumothorax. Unchanged bones.  IMPRESSION: Hyperexpanded lungs without acute cardiopulmonary disease.   Original Report Authenticated By: Waynard Reeds, M.D.    Ct Angio Chest W/cm &/or Wo Cm  04/25/2012  *RADIOLOGY REPORT*  Clinical Data: Fever, abdominal pain, chest pain, palpitations  CT ANGIOGRAPHY CHEST  Technique:  Multidetector CT imaging of the chest using the standard protocol during bolus administration of intravenous contrast. Multiplanar reconstructed images including MIPs were obtained and reviewed to evaluate the vascular anatomy.  Contrast: OMNIPAQUE IOHEXOL 350 MG/ML SOLN  Comparison: Chest radiograph - earlier same day; chest CT - 03/04/2011; 03/24/2010   Vascular Findings:  There is adequate opacification of the pulmonary arterial tree with the main pulmonary artery measuring 400 and 25 HU.  There is no discrete filling defect within the pulmonary arterial tree to suggest pulmonary embolism.  The caliber of the main pulmonary artery is normal.  Normal heart size.  No pericardial effusion.  Although this examination was not tailored for evaluation of the thoracic aorta there is no definite evidence of thoracic aortic dissection.  Minimal ectasia of the ascending thoracic aorta measuring approximately 3.7 cm as measured at the level of the main pulmonary artery (image 17, series 604), previously, approximately 3.4 cm (as measured on the 03/2010 examination).  Normal caliber of the aortic arch measuring approximately 3 cm in greatest oblique sagittal dimension (image 52, series 6/3) as measured just distal to the takeoff of the left subclavian artery. 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  Normal caliber of the distal aspect of the descending thoracic aorta measuring approximately 2.6 cm in greatest oblique axial dimension at the level of the diaphragmatic hiatus (image 88, series four).  Nonvascular findings:  Evaluation of the pulmonary parenchyma is minimally degraded secondary to patient respiratory artifact.  Grossly unchanged 6 mm nodule within the right lower lobe, stable since the 08/20 11 examination and best favored to be a benign etiology.  Smaller 2 mm nodule with the right upper lobe (image 29) is also unchanged. No new discrete pulmonary nodules.  No focal airspace opacities.  No pleural effusion or pneumothorax.  No mediastinal, hilar or axillary lymphadenopathy.  Incidental imaging of the upper abdomen demonstrates multiple sub centimeter hypoattenuating hepatic lesions in the dome of the right and left lobe of the liver which are grossly unchanged and remain too small to accurately characterize the favored to represent  hepatic cysts.  No acute or aggressive osseous abnormalities.  IMPRESSION:  1.   No acute cardiopulmonary disease.  Specifically, no evidence of pulmonary embolism.  2.  Unchanged 6 mm nodule within right lower lobe, stable since the 03/2010 examination and thus favored to be of benign etiology.  3.  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  3.  Aberrant right subclavian artery.   Original Report Authenticated By: Waynard Reeds, M.D.     Medications: I have reviewed the patient's current medications. Scheduled Meds:   . aspirin EC  325 mg Oral Daily  . diltiazem  10 mg Intravenous Once  . diltiazem  240 mg Oral Daily  . enoxaparin (LOVENOX) injection  40 mg Subcutaneous Q24H  . guaiFENesin  600 mg Oral BID  . ipratropium  0.5 mg Nebulization Q6H  . levalbuterol  0.63 mg Nebulization Q6H  . levofloxacin  750  mg Oral Daily  . methylPREDNISolone (SOLU-MEDROL) injection  80 mg Intravenous Q6H  . metoprolol  5 mg Intravenous Once  . potassium chloride  30 mEq Oral Once  . potassium chloride  40 mEq Oral Once  . predniSONE  40 mg Oral Q breakfast  . sodium chloride  1,000 mL Intravenous Once  . sodium chloride  3 mL Intravenous Q12H  . sodium polystyrene  15 g Oral Once  . tiotropium  18 mcg Inhalation Daily  . DISCONTD: sodium chloride   Intravenous STAT  . DISCONTD: diltiazem  180 mg Oral Daily  . DISCONTD: methylPREDNISolone (SOLU-MEDROL) injection  80 mg Intravenous Q6H  . DISCONTD: moxifloxacin  400 mg Intravenous Q24H   Continuous Infusions:   . sodium chloride 50 mL/hr at 04/25/12 2329  . DISCONTD: diltiazem (CARDIZEM) infusion 10 mg/hr (04/26/12 0012)   PRN Meds:.acetaminophen, acetaminophen, iohexol, levalbuterol, ondansetron (ZOFRAN) IV, ondansetron (ZOFRAN) IV, ondansetron  Assessment/Plan: Asthmatic bronchitis with exacerbation  Transition IV  Solu-Medrol 80 mg every 6 hours to prednisone. Continue Xopenex and Atrovent nebulizations every 6 and when necessary.Transition oral moxifloxacin to levofloxacin to define five-day course.  CT chest negative for pneumonia. Supportive care with oxygen, Mucinex, flutter valve.  Atrial fibrillation with rapid ventricular response, patient converted to sinus rhythm. Triggered by resp status and ?albuterol nebs. Transition Diltiazem drip to oral diltiazem. Avoid beta blockers due to ongoing bronchospasm. 2D echo, EF of 55% without valvular disease in 5/13, TSH and free T4 also normal then. CHADs2 score is 1, hence did not feel the need for systemic anticoagulation at this point. Aspirin 325 mg daily. In addition cost and lack of followup would limit options.  Hyperkalemia Resolved with Kayexalate.  Hypokalemia Replace as needed.  Check magnesium level in the morning.  Hypertension Stable.  Continue diltiazem.  Prophylaxis Lovenox.  FULL CODE  Disposition Transfer to telemetry.   LOS: 1 day  Crockett Rallo A, MD 04/26/2012, 10:52 AM

## 2012-04-27 DIAGNOSIS — J9600 Acute respiratory failure, unspecified whether with hypoxia or hypercapnia: Secondary | ICD-10-CM

## 2012-04-27 LAB — CBC
HCT: 38.7 % (ref 36.0–46.0)
MCHC: 33.3 g/dL (ref 30.0–36.0)
MCV: 80.3 fL (ref 78.0–100.0)
RDW: 14.7 % (ref 11.5–15.5)

## 2012-04-27 LAB — BASIC METABOLIC PANEL
BUN: 14 mg/dL (ref 6–23)
Creatinine, Ser: 0.7 mg/dL (ref 0.50–1.10)
GFR calc Af Amer: >90 mL/min (ref >90)
GFR calc non Af Amer: >90 mL/min (ref >90)

## 2012-04-27 MED ORDER — LEVALBUTEROL TARTRATE 45 MCG/ACT IN AERO
1.0000 | INHALATION_SPRAY | Freq: Four times a day (QID) | RESPIRATORY_TRACT | Status: DC | PRN
  Filled 2012-04-27 (×2): qty 15

## 2012-04-27 MED ORDER — DILTIAZEM HCL ER COATED BEADS 240 MG PO CP24: 240.0000 mg | ORAL_CAPSULE | Freq: Every day | ORAL | Status: DC

## 2012-04-27 MED ORDER — LEVALBUTEROL TARTRATE 45 MCG/ACT IN AERO: 1.0000 | INHALATION_SPRAY | Freq: Four times a day (QID) | RESPIRATORY_TRACT | Status: DC | PRN

## 2012-04-27 MED ORDER — FLUTICASONE-SALMETEROL 100-50 MCG/DOSE IN AEPB
1.0000 | INHALATION_SPRAY | Freq: Two times a day (BID) | RESPIRATORY_TRACT | Status: DC
  Administered 2012-04-27: 1 via RESPIRATORY_TRACT
  Filled 2012-04-27: qty 14

## 2012-04-27 MED ORDER — GUAIFENESIN ER 600 MG PO TB12: 600.0000 mg | ORAL_TABLET | Freq: Two times a day (BID) | ORAL | Status: DC

## 2012-04-27 MED ORDER — PREDNISONE 10 MG PO TABS: ORAL_TABLET | ORAL | Status: DC

## 2012-04-27 MED ORDER — TIOTROPIUM BROMIDE MONOHYDRATE 18 MCG IN CAPS: 18.0000 ug | ORAL_CAPSULE | Freq: Every day | RESPIRATORY_TRACT | Status: DC

## 2012-04-27 MED ORDER — TRAMADOL HCL 50 MG PO TABS
50.0000 mg | ORAL_TABLET | ORAL | Status: AC
  Administered 2012-04-27: 50 mg via ORAL
  Filled 2012-04-27: qty 1

## 2012-04-27 MED ORDER — LEVOFLOXACIN 750 MG PO TABS: 750.0000 mg | ORAL_TABLET | Freq: Every day | ORAL | Status: AC

## 2012-04-27 MED ORDER — ASPIRIN 325 MG PO TBEC: 325.0000 mg | DELAYED_RELEASE_TABLET | Freq: Every day | ORAL | Status: AC

## 2012-04-27 MED ORDER — LEVALBUTEROL HCL 0.63 MG/3ML IN NEBU: 0.6300 mg | INHALATION_SOLUTION | Freq: Four times a day (QID) | RESPIRATORY_TRACT | Status: DC | PRN

## 2012-04-27 MED ORDER — FLUTICASONE-SALMETEROL 100-50 MCG/DOSE IN AEPB: 1.0000 | INHALATION_SPRAY | Freq: Two times a day (BID) | RESPIRATORY_TRACT | Status: DC

## 2012-04-27 MED ORDER — ONDANSETRON HCL 4 MG PO TABS: 4.0000 mg | ORAL_TABLET | Freq: Four times a day (QID) | ORAL | Status: AC | PRN

## 2012-04-27 NOTE — Progress Notes (Signed)
Subjective: Breathing better.  Had headache last night with elevated blood pressure. Reported that she vomited this morning. Nausea resolved this morning and tolerated breakfast. Heart rate controlled currently in sinus rhythm.  Objective: Vital signs in last 24 hours: Filed Vitals:   04/26/12 1551 04/26/12 2100 04/27/12 0500 04/27/12 0755  BP:  170/89 148/90   Pulse:  96 88   Temp:  98.4 F (36.9 C) 98.9 F (37.2 C)   TempSrc:  Oral Oral   Resp:      Height:      Weight:      SpO2: 95% 96% 95% 94%   Weight change:   Intake/Output Summary (Last 24 hours) at 04/27/12 0954 Last data filed at 04/26/12 1400  Gross per 24 hour  Intake 220.67 ml  Output    100 ml  Net 120.67 ml    Physical Exam: General: Awake, Oriented, No acute distress. HEENT: EOMI. Neck: Supple CV: S1 and S2 Lungs: CTAB (after neb treatment).  Good respiratory effort. Abdomen: Soft, Nontender, Nondistended, +bowel sounds. Ext: Good pulses. Trace edema.  Lab Results: Basic Metabolic Panel:  Lab 04/27/12 1610 04/26/12 0525 04/26/12 0005 04/25/12 1131 04/24/12 0909  NA 145 142 145 140 140  K 3.5 3.2* 2.8* 5.5* 3.2*  CL 107 106 105 102 103  CO2 26 24 28 26 24   GLUCOSE 119* 172* 112* 100* 88  BUN 14 7 8 13 10   CREATININE 0.70 0.60 0.62 0.75 0.65  CALCIUM 10.5 10.0 10.0 10.3 10.5  MG 2.2 -- -- -- --  PHOS -- -- -- -- --   Liver Function Tests:  Lab 04/26/12 0525 04/24/12 0909  AST 20 23  ALT 19 24  ALKPHOS 92 111  BILITOT 0.3 0.4  PROT 7.4 8.2  ALBUMIN 3.9 4.4    Lab 04/24/12 0909  LIPASE 23  AMYLASE --   No results found for this basename: AMMONIA:5 in the last 168 hours CBC:  Lab 04/27/12 0537 04/26/12 0525 04/26/12 0005 04/25/12 1131 04/24/12 0909  WBC 15.5* 4.6 5.8 5.9 5.0  NEUTROABS -- -- -- 2.1 2.2  HGB 12.9 13.4 14.0 14.6 14.4  HCT 38.7 40.6 41.4 42.9 42.1  MCV 80.3 81.2 80.9 80.3 79.7  PLT 231 222 226 246 211   Cardiac Enzymes:  Lab 04/25/12 1132 04/24/12 0909  CKTOTAL  -- --  CKMB -- --  CKMBINDEX -- --  TROPONINI <0.30 <0.30   BNP (last 3 results)  Basename 02/06/12 0931  PROBNP 52.8   CBG: No results found for this basename: GLUCAP:5 in the last 168 hours No results found for this basename: HGBA1C:5 in the last 72 hours Other Labs: No components found with this basename: POCBNP:3  Lab 04/25/12 1131  DDIMER 0.95*   No results found for this basename: CHOL:2,HDL:2,LDLCALC:2,TRIG:2,CHOLHDL:2,LDLDIRECT:2 in the last 168 hours No results found for this basename: TSH,T4TOTAL,FREET3,T3FREE,FREET4,THYROIDAB in the last 168 hours No results found for this basename: VITAMINB12:2,FOLATE:2,FERRITIN:2,TIBC:2,IRON:2,RETICCTPCT:2 in the last 168 hours  Micro Results: Recent Results (from the past 240 hour(s))  MRSA PCR SCREENING     Status: Normal   Collection Time   04/25/12 11:10 PM      Component Value Range Status Comment   MRSA by PCR NEGATIVE  NEGATIVE Final     Studies/Results: Dg Chest 2 View  04/25/2012  *RADIOLOGY REPORT*  Clinical Data: Wheezing, history of asthma, chest pain  CHEST - 2 VIEW  Comparison: 04/24/2012; 04/07/2012; 02/23/2012; chest CT - 03/04/2011  Findings: Unchanged cardiac silhouette  and mediastinal contours. The lungs remain hyperexpanded with flattening of bilateral hemidiaphragms. Electrical cardiac monitor stickers overlying the bilateral lung apices and left lateral chest wall.  No focal airspace opacities.  No pleural effusion or pneumothorax. Unchanged bones.  IMPRESSION: Hyperexpanded lungs without acute cardiopulmonary disease.   Original Report Authenticated By: Waynard Reeds, M.D.    Ct Angio Chest W/cm &/or Wo Cm  04/25/2012  *RADIOLOGY REPORT*  Clinical Data: Fever, abdominal pain, chest pain, palpitations  CT ANGIOGRAPHY CHEST  Technique:  Multidetector CT imaging of the chest using the standard protocol during bolus administration of intravenous contrast. Multiplanar reconstructed images including MIPs were obtained  and reviewed to evaluate the vascular anatomy.  Contrast: OMNIPAQUE IOHEXOL 350 MG/ML SOLN  Comparison: Chest radiograph - earlier same day; chest CT - 03/04/2011; 03/24/2010  Vascular Findings:  There is adequate opacification of the pulmonary arterial tree with the main pulmonary artery measuring 400 and 25 HU.  There is no discrete filling defect within the pulmonary arterial tree to suggest pulmonary embolism.  The caliber of the main pulmonary artery is normal.  Normal heart size.  No pericardial effusion.  Although this examination was not tailored for evaluation of the thoracic aorta there is no definite evidence of thoracic aortic dissection.  Minimal ectasia of the ascending thoracic aorta measuring approximately 3.7 cm as measured at the level of the main pulmonary artery (image 17, series 604), previously, approximately 3.4 cm (as measured on the 03/2010 examination).  Normal caliber of the aortic arch measuring approximately 3 cm in greatest oblique sagittal dimension (image 52, series 6/3) as measured just distal to the takeoff of the left subclavian artery. 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  Normal caliber of the distal aspect of the descending thoracic aorta measuring approximately 2.6 cm in greatest oblique axial dimension at the level of the diaphragmatic hiatus (image 88, series four).  Nonvascular findings:  Evaluation of the pulmonary parenchyma is minimally degraded secondary to patient respiratory artifact.  Grossly unchanged 6 mm nodule within the right lower lobe, stable since the 08/20 11 examination and best favored to be a benign etiology.  Smaller 2 mm nodule with the right upper lobe (image 29) is also unchanged. No new discrete pulmonary nodules.  No focal airspace opacities.  No pleural effusion or pneumothorax.  No mediastinal, hilar or axillary lymphadenopathy.  Incidental imaging of the upper abdomen demonstrates multiple sub centimeter  hypoattenuating hepatic lesions in the dome of the right and left lobe of the liver which are grossly unchanged and remain too small to accurately characterize the favored to represent hepatic cysts.  No acute or aggressive osseous abnormalities.  IMPRESSION:  1.   No acute cardiopulmonary disease.  Specifically, no evidence of pulmonary embolism.  2.  Unchanged 6 mm nodule within right lower lobe, stable since the 03/2010 examination and thus favored to be of benign etiology.  3.  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  3.  Aberrant right subclavian artery.   Original Report Authenticated By: Waynard Reeds, M.D.     Medications: I have reviewed the patient's current medications. Scheduled Meds:    . aspirin EC  325 mg Oral Daily  . diltiazem  240 mg Oral Daily  . enoxaparin (LOVENOX) injection  40 mg Subcutaneous Q24H  . Fluticasone-Salmeterol  1 puff Inhalation BID  .  guaiFENesin  600 mg Oral BID  . levalbuterol  0.63 mg Nebulization Q6H  . levofloxacin  750 mg Oral Q1200  . methylPREDNISolone (SOLU-MEDROL) injection  80 mg Intravenous Q6H  . potassium chloride  40 mEq Oral Once  . predniSONE  40 mg Oral Q breakfast  . sodium chloride  3 mL Intravenous Q12H  . tiotropium  18 mcg Inhalation Daily  . traMADol  50 mg Oral NOW  . DISCONTD: diltiazem  180 mg Oral Daily  . DISCONTD: diltiazem  240 mg Oral Daily  . DISCONTD: ipratropium  0.5 mg Nebulization Q6H  . DISCONTD: methylPREDNISolone (SOLU-MEDROL) injection  80 mg Intravenous Q6H  . DISCONTD: moxifloxacin  400 mg Intravenous Q24H   Continuous Infusions:    . sodium chloride 1,000 mL (04/26/12 1231)  . DISCONTD: diltiazem (CARDIZEM) infusion 10 mg/hr (04/26/12 0012)   PRN Meds:.acetaminophen, acetaminophen, levalbuterol, levalbuterol, ondansetron (ZOFRAN) IV, ondansetron  Assessment/Plan: Asthmatic  bronchitis with exacerbation  Transitioned IV Solu-Medrol 80 mg every 6 hours to prednisone, taper prednisone after discharge. Continue Xopenex and Atrovent nebulizations every 6 and when necessary. Transitioned oral moxifloxacin to levofloxacin on 04/26/2012 to define five-day course.  CT chest negative for pneumonia. Supportive care with oxygen, Mucinex, flutter valve.  Atrial fibrillation with rapid ventricular response, patient converted to sinus rhythm. Triggered by resp status and ?albuterol nebs. Transitioned Diltiazem drip to oral diltiazem on 04/26/2012. Avoid beta blockers due to ongoing bronchospasm. 2D echo, EF of 55% without valvular disease on 5/13, TSH and free T4 also normal then. CHADs2 score is 1, hence did not feel the need for systemic anticoagulation at this point. Aspirin 325 mg daily. In addition cost and lack of followup would limit options.  Hyperkalemia Resolved with Kayexalate.  Hypokalemia Resolved with replacement. Magnesium normal.  Hypertension Slightly elevated, but now on higher than home dose of diltiazem.   Prophylaxis Lovenox.  FULL CODE  Disposition Discharge the patient home.   LOS: 2 days  Jazman Reuter A, MD 04/27/2012, 9:54 AM

## 2012-04-27 NOTE — Care Management Note (Signed)
    Page 1 of 1   04/27/2012     12:23:47 PM   CARE MANAGEMENT NOTE 04/27/2012  Patient:  [name], [name]   Account Number:  [id number]  Date Initiated:  04/27/2012  Documentation initiated by:  GRAVES-BIGELOW,Romina Divirgilio  Subjective/Objective Assessment:   Pt admitted with afib with rvr. No insurance. Pt recently had [facility] with orange card.     Action/Plan:   CM provided pt with meds via [employer] fund, xopenex coupons, PCP for [employer]- CM called and Pt is to call Sept 16 to set up an appointment. Pt has #. Pt also given resources to [facility] in [place].   Anticipated DC Date:  04/27/2012   Anticipated DC Plan:  HOME/SELF CARE      DC Planning Services  CM consult      Choice offered to / List presented to:  C-1 Patient        HH arranged  HH-1 RN  HH-10 DISEASE MANAGEMENT  HH-6 SOCIAL WORKER      HH agency  [facility]   Status of service:  Completed, signed off Medicare Important Message given?   (If response is "NO", the following Medicare IM given date fields will be blank) Date Medicare IM given:   Date Additional Medicare IM given:    Discharge Disposition:  HOME W HOME HEALTH SERVICES  Per UR Regulation:  Reviewed for med. necessity/level of care/duration of stay  If discussed at Long Length of Stay Meetings, dates discussed:    Comments:  04-27-12 [address] [place], [place] [phone] CM did place call to MD  for hh services order. Referral was made and SOC to begin within 24-48 hours post d/c.

## 2012-04-27 NOTE — Discharge Summary (Signed)
Physician Discharge Summary  [name] UJW:[id number] DOB: [birth year] DOA: 04/25/2012  PCP:  Dala Dock, now would like to find a new PCP.  Admit date: 04/25/2012 Discharge date: 04/27/2012  Recommendations for Outpatient Follow-up:  1. Followup with PCP with resources provided to your by case manager.  Discharge Diagnoses:  Active Problems:  HYPERTENSION  Asthmatic bronchitis with exacerbation  Atrial fibrillation with rapid ventricular response  Hyperkalemia  Discharge Condition: Stable  Diet recommendation: Heart healthy diet.  Filed Weights   04/25/12 2230  Weight: 56.3 kg (124 lb 1.9 oz)    History of present illness:  61 y/o female with history of asthma and chronic bronchitis, paroxysmal atrial fibrillation who presented with palpitations on 04/25/2012.  Hospital Course:  Asthmatic bronchitis with exacerbation  Initially started on IV Solu-Medrol 80 mg which was transitioned prednisone, taper prednisone after discharge. Continue Xopenex and Atrovent nebulizations every 6 and when necessary. Transitioned oral moxifloxacin to levofloxacin on 04/26/2012 to define five-day course.  CT chest negative for pneumonia. Supportive care with oxygen, Mucinex, flutter valve.  Atrial fibrillation with rapid ventricular response, patient converted to sinus rhythm. Triggered by resp status and ?albuterol nebs. Transitioned Diltiazem drip to oral diltiazem on 04/26/2012. Avoid beta blockers due to ongoing bronchospasm. 2D echo, EF of 55% without valvular disease on 5/13, TSH and free T4 also normal then. CHADs2 score is 1, hence did not feel the need for systemic anticoagulation at this point. Aspirin 325 mg daily. In addition cost and lack of followup would limit options. Transitioned albuterol to levalbuterol prior to discharge.  Hyperkalemia Resolved with Kayexalate.  Hypokalemia Resolved with replacement. Magnesium normal.  Hypertension Slightly elevated, but now on higher than home dose  of diltiazem.   Procedures:  As below.  Consultations:  None  Discharge Exam: Filed Vitals:   04/27/12 0500  BP: 148/90  Pulse: 88  Temp: 98.9 F (37.2 C)  Resp:    Filed Vitals:   04/26/12 1551 04/26/12 2100 04/27/12 0500 04/27/12 0755  BP:  170/89 148/90   Pulse:  96 88   Temp:  98.4 F (36.9 C) 98.9 F (37.2 C)   TempSrc:  Oral Oral   Resp:      Height:      Weight:      SpO2: 95% 96% 95% 94%    Discharge Instructions  Discharge Orders    Future Orders Please Complete By Expires   Diet - low sodium heart healthy      Increase activity slowly      Discharge instructions      Comments:   Followup with PCP in 1 month.     Medication List  As of 04/27/2012 10:03 AM   STOP taking these medications         albuterol (2.5 MG/3ML) 0.083% nebulizer solution      albuterol 108 (90 BASE) MCG/ACT inhaler      diltiazem 180 MG 24 hr capsule         TAKE these medications         aspirin 325 MG EC tablet   Take 1 tablet (325 mg total) by mouth daily.      diltiazem 240 MG 24 hr capsule   Commonly known as: CARDIZEM CD   Take 1 capsule (240 mg total) by mouth daily.      Fluticasone-Salmeterol 100-50 MCG/DOSE Aepb   Commonly known as: ADVAIR   Inhale 1 puff into the lungs 2 (two) times daily.  guaiFENesin 600 MG 12 hr tablet   Commonly known as: MUCINEX   Take 1 tablet (600 mg total) by mouth 2 (two) times daily.      levalbuterol 0.63 MG/3ML nebulizer solution   Commonly known as: XOPENEX   Take 3 mLs (0.63 mg total) by nebulization every 6 (six) hours as needed for wheezing or shortness of breath.      levalbuterol 45 MCG/ACT inhaler   Commonly known as: XOPENEX HFA   Inhale 1-2 puffs into the lungs every 6 (six) hours as needed for wheezing or shortness of breath.      levofloxacin 750 MG tablet   Commonly known as: LEVAQUIN   Take 1 tablet (750 mg total) by mouth daily at 12 noon.      ondansetron 4 MG tablet   Commonly known as: ZOFRAN     Take 1 tablet (4 mg total) by mouth every 6 (six) hours as needed for nausea.      predniSONE 10 MG tablet   Commonly known as: DELTASONE   Take 40 mg daily for 2 days, then 20 mg daily for 2 days, then 10 mg daily for 2 days, then 5 mg daily for 2 days then discontinue      tiotropium 18 MCG inhalation capsule   Commonly known as: SPIRIVA   Place 1 capsule (18 mcg total) into inhaler and inhale daily.              The results of significant diagnostics from this hospitalization (including imaging, microbiology, ancillary and laboratory) are listed below for reference.    Significant Diagnostic Studies: Dg Chest 2 View  04/25/2012  *RADIOLOGY REPORT*  Clinical Data: Wheezing, history of asthma, chest pain  CHEST - 2 VIEW  Comparison: 04/24/2012; 04/07/2012; 02/23/2012; chest CT - 03/04/2011  Findings: Unchanged cardiac silhouette and mediastinal contours. The lungs remain hyperexpanded with flattening of bilateral hemidiaphragms. Electrical cardiac monitor stickers overlying the bilateral lung apices and left lateral chest wall.  No focal airspace opacities.  No pleural effusion or pneumothorax. Unchanged bones.  IMPRESSION: Hyperexpanded lungs without acute cardiopulmonary disease.   Original Report Authenticated By: Waynard Reeds, M.D.    Dg Chest 2 View  04/24/2012  *RADIOLOGY REPORT*  Clinical Data: Cough and weakness.  CHEST - 2 VIEW  Comparison: 04/07/2012  Findings: Mild hyperinflation with interstitial thickening. Midline trachea.  Normal heart size and mediastinal contours. No pleural effusion or pneumothorax.  Clear lungs.  IMPRESSION: COPD/chronic bronchitis. No acute superimposed process.   Original Report Authenticated By: Consuello Bossier, M.D.    Dg Chest 2 View  04/07/2012  *RADIOLOGY REPORT*  Clinical Data: Short of breath.  Wheezing.  Diabetes.  CHEST - 2 VIEW  Comparison: Multiple priors, 02/22/2012.  Findings:  There is a tiny focus of patchy density in the right mid  lung suspicious for small focus of pneumonia.  This is new compared to prior comparison radiographs.  Emphysema is present. Cardiopericardial silhouette appears within normal limits.  No effusions. Follow-up to ensure radiographic clearing recommended. Clearing is usually observed at 8 weeks.  IMPRESSION: Tiny focus of right midlung airspace opacity suspicious for pneumonia.  Chronic changes of emphysema.  Original Report Authenticated By: Andreas Newport, M.D.   Ct Angio Chest W/cm &/or Wo Cm  04/25/2012  *RADIOLOGY REPORT*  Clinical Data: Fever, abdominal pain, chest pain, palpitations  CT ANGIOGRAPHY CHEST  Technique:  Multidetector CT imaging of the chest using the standard protocol during bolus administration of  intravenous contrast. Multiplanar reconstructed images including MIPs were obtained and reviewed to evaluate the vascular anatomy.  Contrast: OMNIPAQUE IOHEXOL 350 MG/ML SOLN  Comparison: Chest radiograph - earlier same day; chest CT - 03/04/2011; 03/24/2010  Vascular Findings:  There is adequate opacification of the pulmonary arterial tree with the main pulmonary artery measuring 400 and 25 HU.  There is no discrete filling defect within the pulmonary arterial tree to suggest pulmonary embolism.  The caliber of the main pulmonary artery is normal.  Normal heart size.  No pericardial effusion.  Although this examination was not tailored for evaluation of the thoracic aorta there is no definite evidence of thoracic aortic dissection.  Minimal ectasia of the ascending thoracic aorta measuring approximately 3.7 cm as measured at the level of the main pulmonary artery (image 17, series 604), previously, approximately 3.4 cm (as measured on the 03/2010 examination).  Normal caliber of the aortic arch measuring approximately 3 cm in greatest oblique sagittal dimension (image 52, series 6/3) as measured just distal to the takeoff of the left subclavian artery. Incidental note is again made of an  aberrant right subclavian artery.  Normal caliber of the distal aspect of the descending thoracic aorta measuring approximately 2.6 cm in greatest oblique axial dimension at the level of the diaphragmatic hiatus (image 88, series four).  Nonvascular findings:  Evaluation of the pulmonary parenchyma is minimally degraded secondary to patient respiratory artifact.  Grossly unchanged 6 mm nodule within the right lower lobe, stable since the 08/20 11 examination and best favored to be a benign etiology.  Smaller 2 mm nodule with the right upper lobe (image 29) is also unchanged. No new discrete pulmonary nodules.  No focal airspace opacities.  No pleural effusion or pneumothorax.  No mediastinal, hilar or axillary lymphadenopathy.  Incidental imaging of the upper abdomen demonstrates multiple sub centimeter hypoattenuating hepatic lesions in the dome of the right and left lobe of the liver which are grossly unchanged and remain too small to accurately characterize the favored to represent hepatic cysts.  No acute or aggressive osseous abnormalities.  IMPRESSION:  1.   No acute cardiopulmonary disease.  Specifically, no evidence of pulmonary embolism.  2.  Unchanged 6 mm nodule within right lower lobe, stable since the 03/2010 examination and thus favored to be of benign etiology.  3.  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  3.  Aberrant right subclavian artery.   Original Report Authenticated By: Waynard Reeds, M.D.     Microbiology: Recent Results (from the past 240 hour(s))  MRSA PCR SCREENING     Status: Normal   Collection Time   04/25/12 11:10 PM      Component Value Range Status Comment   MRSA by PCR NEGATIVE  NEGATIVE Final      Labs: Basic Metabolic Panel:  Lab 04/27/12 0981 04/26/12 0525 04/26/12 0005 04/25/12 1131 04/24/12 0909  NA 145 142 145 140 140  K 3.5 3.2*  2.8* 5.5* 3.2*  CL 107 106 105 102 103  CO2 26 24 28 26 24   GLUCOSE 119* 172* 112* 100* 88  BUN 14 7 8 13 10   CREATININE 0.70 0.60 0.62 0.75 0.65  CALCIUM 10.5 10.0 10.0 10.3 10.5  MG 2.2 -- -- -- --  PHOS -- -- -- -- --   Liver Function Tests:  Lab 04/26/12 0525 04/24/12 0909  AST 20  23  ALT 19 24  ALKPHOS 92 111  BILITOT 0.3 0.4  PROT 7.4 8.2  ALBUMIN 3.9 4.4    Lab 04/24/12 0909  LIPASE 23  AMYLASE --   No results found for this basename: AMMONIA:5 in the last 168 hours CBC:  Lab 04/27/12 0537 04/26/12 0525 04/26/12 0005 04/25/12 1131 04/24/12 0909  WBC 15.5* 4.6 5.8 5.9 5.0  NEUTROABS -- -- -- 2.1 2.2  HGB 12.9 13.4 14.0 14.6 14.4  HCT 38.7 40.6 41.4 42.9 42.1  MCV 80.3 81.2 80.9 80.3 79.7  PLT 231 222 226 246 211   Cardiac Enzymes:  Lab 04/25/12 1132 04/24/12 0909  CKTOTAL -- --  CKMB -- --  CKMBINDEX -- --  TROPONINI <0.30 <0.30   BNP: BNP (last 3 results)  Basename 02/06/12 0931  PROBNP 52.8   CBG: No results found for this basename: GLUCAP:5 in the last 168 hours  Time coordinating discharge: 35 minutes  Signed:  Jock Mahon A  [facility] 04/27/2012, 10:04 AM

## 2012-06-10 ENCOUNTER — Emergency Department (HOSPITAL_COMMUNITY)
Admission: EM | Admit: 2012-06-10 | Discharge: 2012-06-10 | Disposition: A | Discharge status: Home / Self Care | Payer: Medicaid Other | Attending: Emergency Medicine | Admitting: Emergency Medicine

## 2012-06-10 ENCOUNTER — Encounter (HOSPITAL_COMMUNITY): Payer: Self-pay | Admitting: *Deleted

## 2012-06-10 ENCOUNTER — Emergency Department (HOSPITAL_COMMUNITY): Payer: Medicaid Other

## 2012-06-10 DIAGNOSIS — Z79899 Other long term (current) drug therapy: Secondary | ICD-10-CM | POA: Insufficient documentation

## 2012-06-10 DIAGNOSIS — I10 Essential (primary) hypertension: Secondary | ICD-10-CM | POA: Insufficient documentation

## 2012-06-10 DIAGNOSIS — J40 Bronchitis, not specified as acute or chronic: Secondary | ICD-10-CM | POA: Insufficient documentation

## 2012-06-10 DIAGNOSIS — J45909 Unspecified asthma, uncomplicated: Secondary | ICD-10-CM | POA: Insufficient documentation

## 2012-06-10 MED ORDER — ALBUTEROL SULFATE (5 MG/ML) 0.5% IN NEBU
5.0000 mg | INHALATION_SOLUTION | Freq: Once | RESPIRATORY_TRACT | Status: AC
  Administered 2012-06-10: 5 mg via RESPIRATORY_TRACT
  Filled 2012-06-10: qty 1

## 2012-06-10 MED ORDER — PREDNISONE 20 MG PO TABS
60.0000 mg | ORAL_TABLET | Freq: Once | ORAL | Status: AC
  Administered 2012-06-10: 60 mg via ORAL
  Filled 2012-06-10: qty 3

## 2012-06-10 MED ORDER — DOCUSATE SODIUM 100 MG PO CAPS: 100.0000 mg | ORAL_CAPSULE | Freq: Two times a day (BID) | ORAL | Status: DC

## 2012-06-10 MED ORDER — PREDNISONE 20 MG PO TABS: 60.0000 mg | ORAL_TABLET | Freq: Every day | ORAL | Status: DC

## 2012-06-10 MED ORDER — ALBUTEROL SULFATE HFA 108 (90 BASE) MCG/ACT IN AERS
2.0000 | INHALATION_SPRAY | RESPIRATORY_TRACT | Status: DC | PRN
  Administered 2012-06-10: 2 via RESPIRATORY_TRACT
  Filled 2012-06-10: qty 6.7

## 2012-06-10 MED ORDER — IPRATROPIUM BROMIDE 0.02 % IN SOLN
0.5000 mg | Freq: Once | RESPIRATORY_TRACT | Status: AC
  Administered 2012-06-10: 0.5 mg via RESPIRATORY_TRACT
  Filled 2012-06-10: qty 2.5

## 2012-06-10 MED ORDER — POLYETHYLENE GLYCOL 3350 17 GM/SCOOP PO POWD: 17.0000 g | Freq: Every day | ORAL | Status: DC

## 2012-06-10 MED ORDER — DILTIAZEM HCL ER COATED BEADS 240 MG PO CP24: 240.0000 mg | ORAL_CAPSULE | Freq: Every day | ORAL | Status: DC

## 2012-06-10 MED ORDER — ALBUTEROL SULFATE (5 MG/ML) 0.5% IN NEBU
2.5000 mg | INHALATION_SOLUTION | Freq: Once | RESPIRATORY_TRACT | Status: AC
  Administered 2012-06-10: 2.5 mg via RESPIRATORY_TRACT
  Filled 2012-06-10: qty 1

## 2012-06-10 NOTE — ED Notes (Signed)
Pt reports cough and chest pain x several days.  Increased pain with deep inspiration.  No acute distress noted.  Reports that it is not 'heart pain' it is a 'chest cold' pain.  deneis N/V/diaphoresis.

## 2012-06-10 NOTE — ED Notes (Signed)
Pt transported to xray 

## 2012-06-10 NOTE — ED Provider Notes (Signed)
History     CSN: [id number]  Arrival date & time 06/10/12  1230   First MD Initiated Contact with Patient 06/10/12 1358      Chief Complaint  Patient presents with  . Cough  . Chest Pain    (Consider location/radiation/quality/duration/timing/severity/associated sxs/prior treatment) HPI Pt reports she has history of asthma has had chest congestion and cough for the last several days, no associated fever. Some chest discomfort with cough and deep breath. Given neb in triage which helped. She reports she was recently admitted for same as well as new afib/RVR. She was started on Cardizem and given a course of prednisone. She finished the steroids about 6 months ago. Pt reports she has run out of her xopanex and cannot afford a refill. Also out of her Cardizem.   Past Medical History  Diagnosis Date  . Asthma   . Hypertension   . GERD (gastroesophageal reflux disease)   . A-fib     Past Surgical History  Procedure Date  . Tubal ligation     Family History  Problem Relation Age of Onset  . Cancer Mother   . Cancer Father   . Heart attack Mother 43    History  Substance Use Topics  . Smoking status: Former Smoker    Quit date: 11/07/2011  . Smokeless tobacco: Never Used  . Alcohol Use: No    OB History    Grav Para Term Preterm Abortions TAB SAB Ect Mult Living                  Review of Systems All other systems reviewed and are negative except as noted in HPI.   Allergies  Celecoxib  Home Medications   Current Outpatient Rx  Name Route Sig Dispense Refill  . DILTIAZEM HCL ER COATED BEADS 240 MG PO CP24 Oral Take 1 capsule (240 mg total) by mouth daily. 30 capsule 0  . FLUTICASONE-SALMETEROL 100-50 MCG/DOSE IN AEPB Inhalation Inhale 1 puff into the lungs 2 (two) times daily. 60 each 0  . LEVALBUTEROL TARTRATE 45 MCG/ACT IN AERO Inhalation Inhale 1-2 puffs into the lungs every 6 (six) hours as needed for wheezing or shortness of breath. 1 Inhaler 0  .  LEVALBUTEROL HCL 0.63 MG/3ML IN NEBU Nebulization Take 3 mLs (0.63 mg total) by nebulization every 6 (six) hours as needed for wheezing or shortness of breath. 360 mL 0  . ADULT MULTIVITAMIN W/MINERALS CH Oral Take 1 tablet by mouth daily.    Marland Kitchen TIOTROPIUM BROMIDE MONOHYDRATE 18 MCG IN CAPS Inhalation Place 1 capsule (18 mcg total) into inhaler and inhale daily. 30 capsule 0    BP 157/103  Pulse 94  Temp 98.6 F (37 C) (Oral)  Resp 16  SpO2 94%  Physical Exam  Nursing note and vitals reviewed. Constitutional: She is oriented to person, place, and time. She appears well-developed and well-nourished.  HENT:  Head: Normocephalic and atraumatic.  Eyes: EOM are normal. Pupils are equal, round, and reactive to light.  Neck: Normal range of motion. Neck supple.  Cardiovascular: Normal rate, normal heart sounds and intact distal pulses.   Pulmonary/Chest: Effort normal. No respiratory distress. She has no wheezes.       Distant breath sounds  Abdominal: Bowel sounds are normal. She exhibits no distension. There is no tenderness.  Musculoskeletal: Normal range of motion. She exhibits no edema and no tenderness.  Neurological: She is alert and oriented to person, place, and time. She has normal strength. No  cranial nerve deficit or sensory deficit.  Skin: Skin is warm and dry. No rash noted.  Psychiatric: She has a normal mood and affect.    ED Course  Procedures (including critical care time)  Labs Reviewed - No data to display Dg Chest 2 View  06/10/2012  *RADIOLOGY REPORT*  Clinical Data: Cough, congestion  CHEST - 2 VIEW  Comparison: 04/25/2012  Findings: Cardiomediastinal silhouette is stable.  Hyperinflation again noted.  No acute infiltrate or pleural effusion.  No pulmonary edema.  IMPRESSION: No active disease.  Hyperinflation again noted.   Original Report Authenticated By: Natasha Mead, M.D.      No diagnosis found.    MDM   Date: 06/10/2012  Rate: 88  Rhythm: normal sinus  rhythm and premature ventricular contractions (PVC)  QRS Axis: normal  Intervals: normal  ST/T Wave abnormalities: normal  Conduction Disutrbances:none  Narrative Interpretation:   Old EKG Reviewed: changes noted, no longer in afib, rate controlled  CXR done in triage neg for infiltrate. Pt had cardiac workup done during recent admission. Pt does not feel symptoms are related to heart and I agree. Pt asking for refills of inhaler, cardizem and for something to help with constipation. Advised to establish with PCP for long term management.          Charles B. Bernette Mayers, MD 06/10/12 1544

## 2012-06-19 IMAGING — CT CT CHEST W/O CM
2 of 3 series · 15 of 36 positions shown, 18 images · non-contrast
Comparison: 08/20/2010 and 03/24/2010.

CLINICAL DATA: Follow up of right lung nodule.  Cough for 1 week.

CT CHEST WITHOUT CONTRAST
TECHNIQUE: Multidetector CT imaging of the chest was performed
following the standard protocol without IV contrast.

[Series 2: routine chest 5.0 st · axial · 0.56mm/px · z∈[+1164,+1450]mm · 12 of 67 slices shown, 15 images]
[im 5/67  mediastinal]
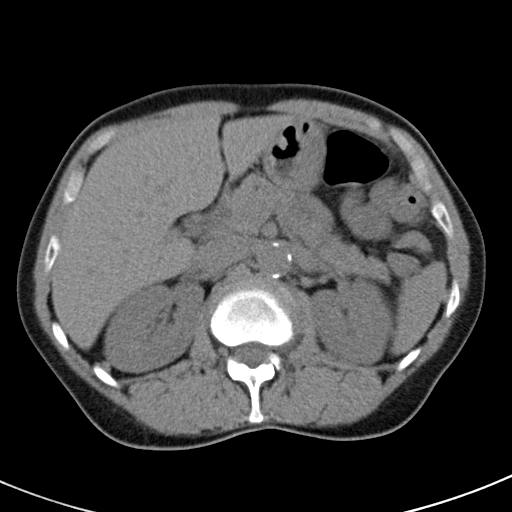
[im 5/67  lung]
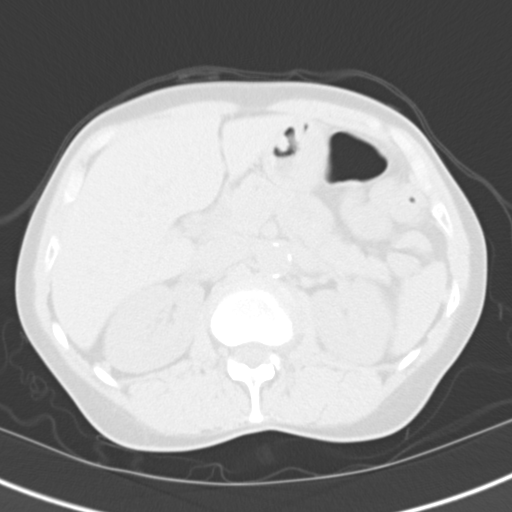
[im 10/67  lung]
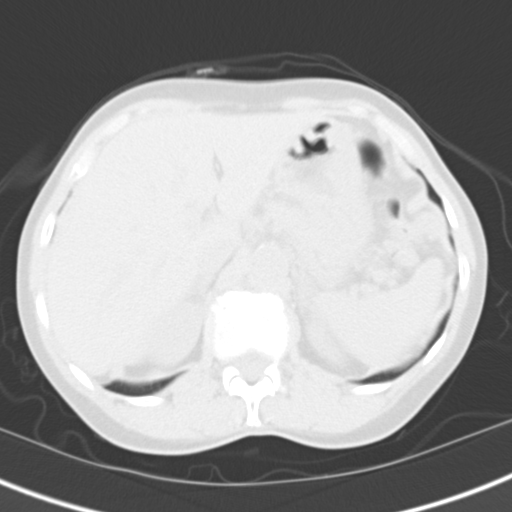
[im 15/67  lung]
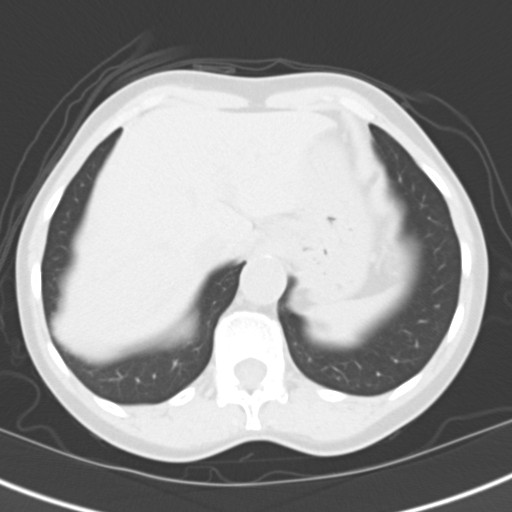
[im 20/67  lung]
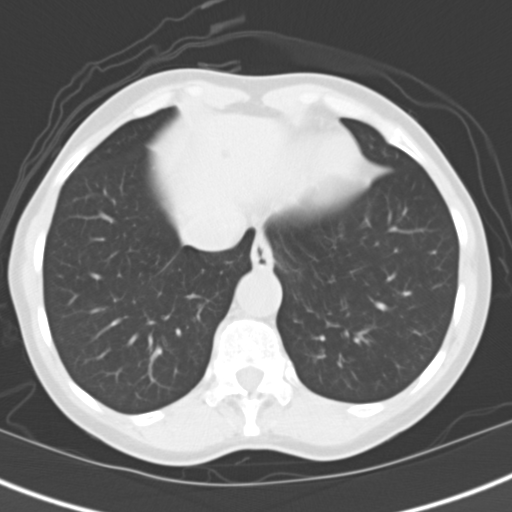
[im 25/67  mediastinal]
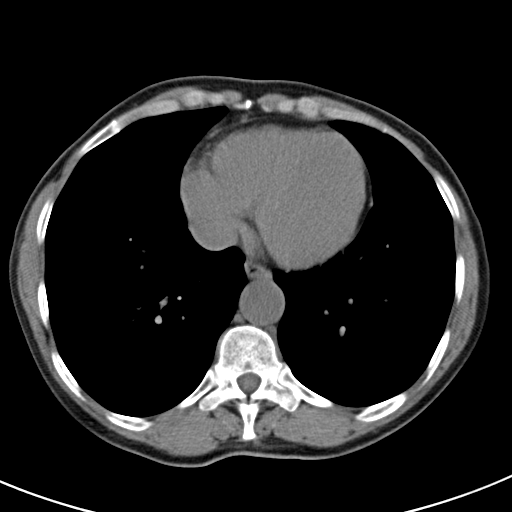
[im 25/67  lung]
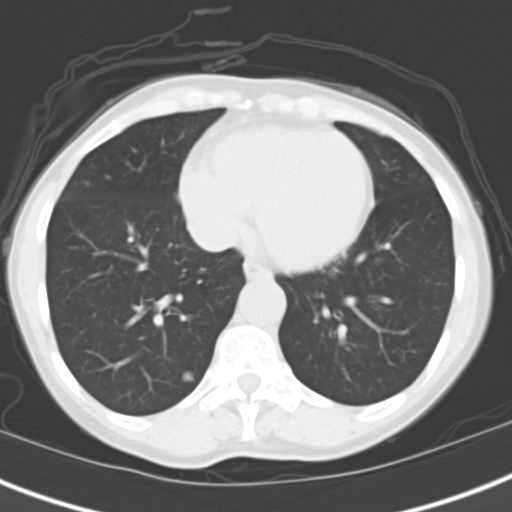
[im 30/67  lung]
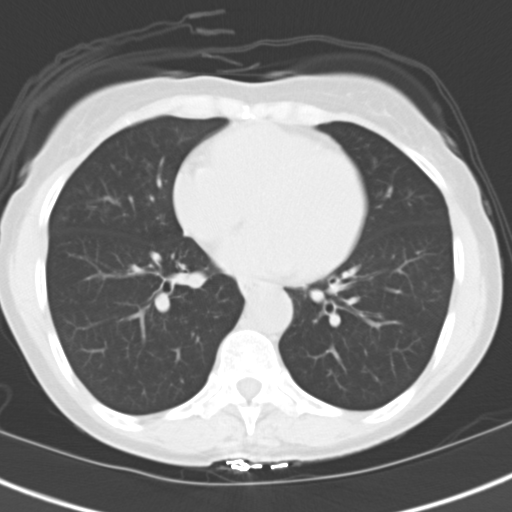
[im 37/67  lung]
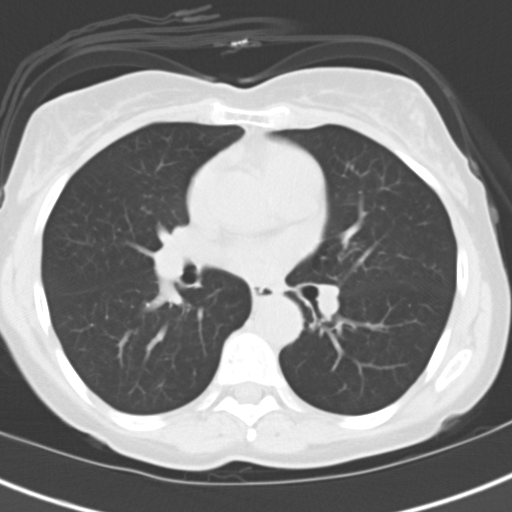
[im 42/67  lung]
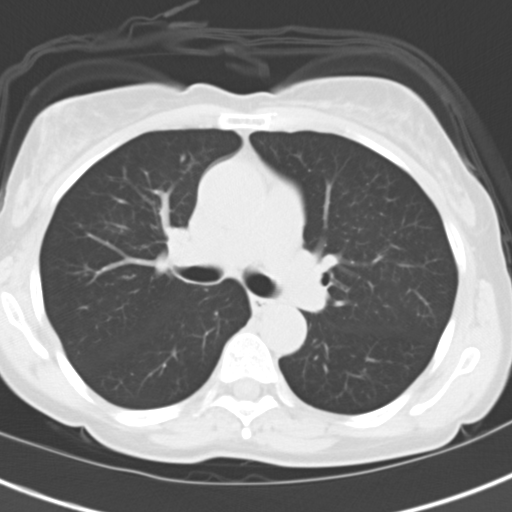
[im 47/67  mediastinal]
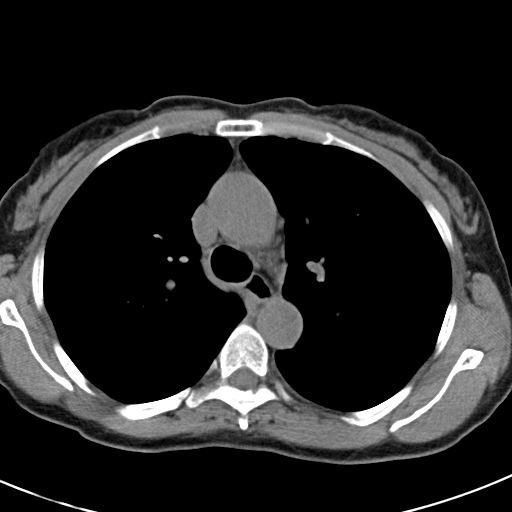
[im 47/67  lung]
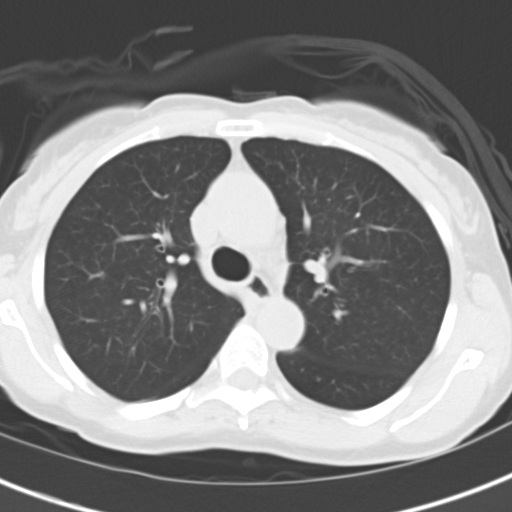
[im 52/67  lung]
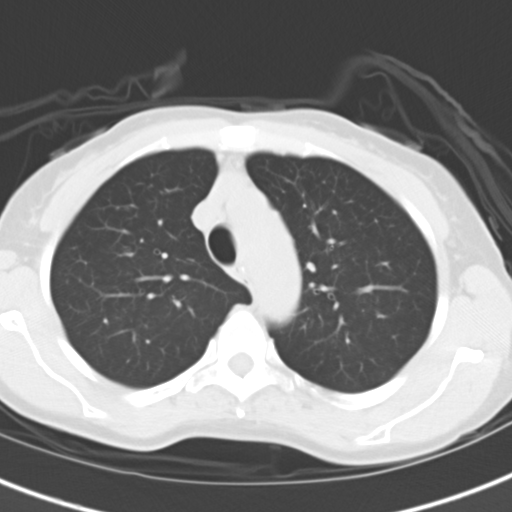
[im 57/67  lung]
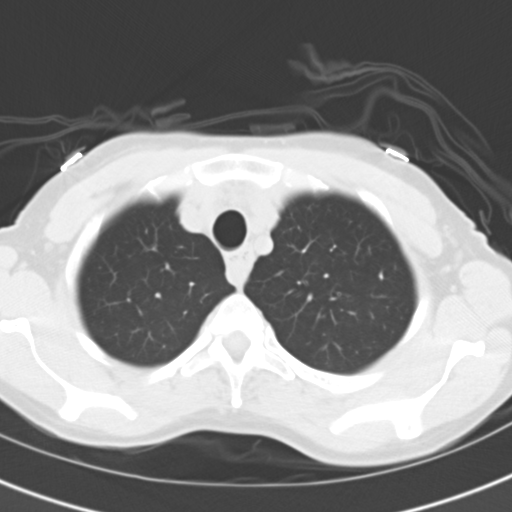
[im 62/67  lung]
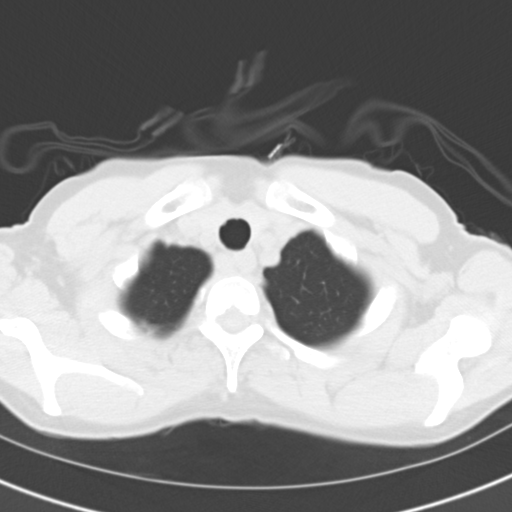

[Series 602: cor · coronal · 0.65mm/px · 3 of 68 slices shown]
[im 14/68  lung]
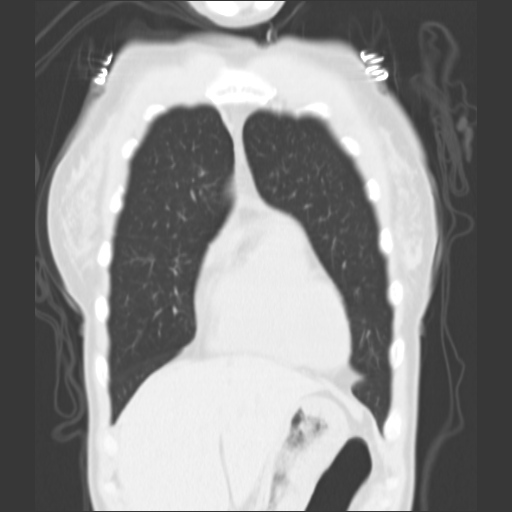
[im 27/68  lung]
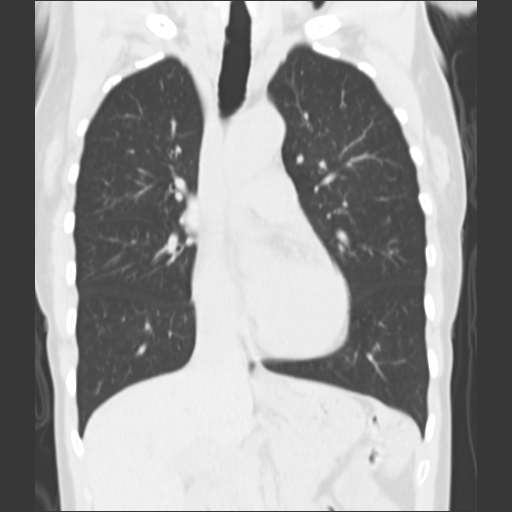
[im 41/68  lung]
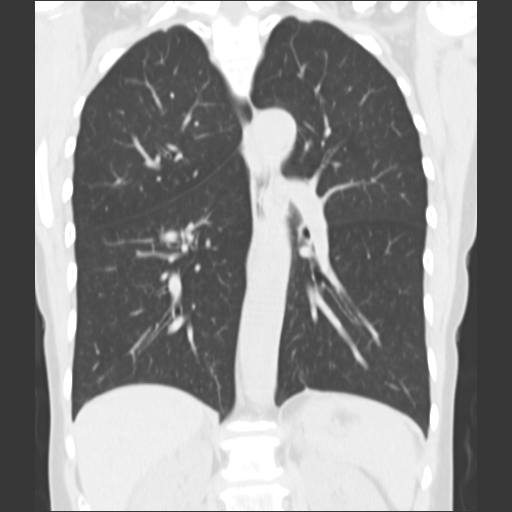

[15 of 36 positions shown; findings below may reference images not displayed]

FINDINGS: Lung windows demonstrate probable secretions within the
left mainstem bronchus on image 28 of series 3.
6 mm right lower lobe lung nodule is unchanged on image 43.
A probable subpleural lymph node at 2 mm in the posterior right
upper lobe on image 18 is similar.

Soft tissue windows demonstrate normal heart size without
pericardial or pleural effusion.

An aberrant right subclavian artery, which arises from the distal
transverse segment and travels posterior to the esophagus. No
mediastinal or definite hilar adenopathy, given limitations of
unenhanced CT.

Limited abdominal imaging demonstrates scattered well-circumscribed
liver lesions, likely cysts.  These are unchanged. No acute osseous
abnormality.
IMPRESSION: 1.  Similar 6 mm right lower lobe lung nodule since 03/24/2010.
Recommend follow-up CT at six - 12 months to confirm stability.
This recommendation follows the consensus statement: Guidelines for
Management of Small Pulmonary Nodules Detected on CT Scans:  A
Statement from the [HOSPITAL] as published in Radiology
2221; [DATE].  Available online at:
[URL]
2.  Aberrant right subclavian artery.

## 2012-07-08 ENCOUNTER — Emergency Department (HOSPITAL_COMMUNITY)
Admission: EM | Admit: 2012-07-08 | Discharge: 2012-07-08 | Disposition: A | Discharge status: Home / Self Care | Payer: Medicaid Other | Attending: Emergency Medicine | Admitting: Emergency Medicine

## 2012-07-08 ENCOUNTER — Encounter (HOSPITAL_COMMUNITY): Payer: Self-pay | Admitting: Physical Medicine and Rehabilitation

## 2012-07-08 DIAGNOSIS — Z87891 Personal history of nicotine dependence: Secondary | ICD-10-CM | POA: Insufficient documentation

## 2012-07-08 DIAGNOSIS — I10 Essential (primary) hypertension: Secondary | ICD-10-CM | POA: Insufficient documentation

## 2012-07-08 DIAGNOSIS — I4891 Unspecified atrial fibrillation: Secondary | ICD-10-CM | POA: Insufficient documentation

## 2012-07-08 DIAGNOSIS — J45901 Unspecified asthma with (acute) exacerbation: Secondary | ICD-10-CM | POA: Insufficient documentation

## 2012-07-08 DIAGNOSIS — Z79899 Other long term (current) drug therapy: Secondary | ICD-10-CM | POA: Insufficient documentation

## 2012-07-08 DIAGNOSIS — K219 Gastro-esophageal reflux disease without esophagitis: Secondary | ICD-10-CM | POA: Insufficient documentation

## 2012-07-08 MED ORDER — DEXAMETHASONE 2 MG PO TABS
16.0000 mg | ORAL_TABLET | Freq: Once | ORAL | Status: AC
  Administered 2012-07-08: 16 mg via ORAL
  Filled 2012-07-08 (×2): qty 8

## 2012-07-08 MED ORDER — ALBUTEROL SULFATE (5 MG/ML) 0.5% IN NEBU
5.0000 mg | INHALATION_SOLUTION | Freq: Once | RESPIRATORY_TRACT | Status: AC
  Administered 2012-07-08: 5 mg via RESPIRATORY_TRACT
  Filled 2012-07-08: qty 1

## 2012-07-08 MED ORDER — DEXAMETHASONE 4 MG PO TABS: 16.0000 mg | ORAL_TABLET | Freq: Once | ORAL | Status: DC

## 2012-07-08 MED ORDER — ALBUTEROL SULFATE HFA 108 (90 BASE) MCG/ACT IN AERS
2.0000 | INHALATION_SPRAY | Freq: Once | RESPIRATORY_TRACT | Status: AC
  Administered 2012-07-08: 2 via RESPIRATORY_TRACT
  Filled 2012-07-08: qty 6.7

## 2012-07-08 MED ORDER — IPRATROPIUM BROMIDE 0.02 % IN SOLN
0.5000 mg | Freq: Once | RESPIRATORY_TRACT | Status: AC
  Administered 2012-07-08: 0.5 mg via RESPIRATORY_TRACT
  Filled 2012-07-08: qty 2.5

## 2012-07-08 MED ORDER — ALBUTEROL SULFATE HFA 108 (90 BASE) MCG/ACT IN AERS: 2.0000 | INHALATION_SPRAY | RESPIRATORY_TRACT | Status: DC | PRN

## 2012-07-08 NOTE — ED Notes (Signed)
Pt presents to department for evaluation of asthma exacerbation. Onset today. Pt states she ran out of albuterol inhaler. Audible wheezing noted upon arrival to ED. Speaking short sentences. Pt is alert and oriented x4.

## 2012-07-08 NOTE — ED Provider Notes (Signed)
History  This chart was scribed for Shelby Razor, MD by Ladona Ridgel Day, ED scribe. This patient was seen in room TR11C/TR11C and the patient's care was started at 1148.   CSN: [id number]  Arrival date & time 07/08/12  1148   First MD Initiated Contact with Patient 07/08/12 1213      Chief Complaint  Patient presents with  . Asthma   Patient is a 61 y.o. female presenting with asthma. The history is provided by the patient. No language interpreter was used.  Asthma This is a recurrent problem. The current episode started 3 to 5 hours ago. The problem occurs constantly. The problem has been gradually worsening. Associated symptoms include shortness of breath. Pertinent negatives include no chest pain. Exacerbated by: weather change. Nothing (she ran out of her inhaler) relieves the symptoms. Treatments tried: Inhaler with mild relief, she has been previously hospitalized for asthma.    Past Medical History  Diagnosis Date  . Asthma   . Hypertension   . GERD (gastroesophageal reflux disease)   . A-fib     Past Surgical History  Procedure Date  . Tubal ligation     Family History  Problem Relation Age of Onset  . Cancer Mother   . Cancer Father   . Heart attack Mother 43    History  Substance Use Topics  . Smoking status: Former Smoker    Quit date: 11/07/2011  . Smokeless tobacco: Never Used  . Alcohol Use: No    OB History    Grav Para Term Preterm Abortions TAB SAB Ect Mult Living                  Review of Systems  Constitutional: Negative for fever and chills.  HENT: Negative for congestion.   Respiratory: Positive for cough (dry cough), shortness of breath and wheezing.   Cardiovascular: Negative for chest pain and leg swelling.  Gastrointestinal: Negative for nausea and vomiting.  Musculoskeletal: Negative for back pain.  Neurological: Negative for dizziness, weakness and light-headedness.  All other systems reviewed and are negative.    Allergies    Celecoxib  Home Medications   Current Outpatient Rx  Name  Route  Sig  Dispense  Refill  . ALBUTEROL SULFATE HFA 108 (90 BASE) MCG/ACT IN AERS   Inhalation   Inhale 2 puffs into the lungs every 4 (four) hours as needed for wheezing.   1 Inhaler   2   . DEXAMETHASONE 4 MG PO TABS   Oral   Take 4 tablets (16 mg total) by mouth once.   4 tablet   0   . DILTIAZEM HCL ER COATED BEADS 240 MG PO CP24   Oral   Take 1 capsule (240 mg total) by mouth daily.   30 capsule   0   . DOCUSATE SODIUM 100 MG PO CAPS   Oral   Take 1 capsule (100 mg total) by mouth every 12 (twelve) hours.   60 capsule   0   . FLUTICASONE-SALMETEROL 100-50 MCG/DOSE IN AEPB   Inhalation   Inhale 1 puff into the lungs 2 (two) times daily.   60 each   0   . LEVALBUTEROL TARTRATE 45 MCG/ACT IN AERO   Inhalation   Inhale 1-2 puffs into the lungs every 6 (six) hours as needed for wheezing or shortness of breath.   1 Inhaler   0   . LEVALBUTEROL HCL 0.63 MG/3ML IN NEBU   Nebulization   Take 3 mLs (  0.63 mg total) by nebulization every 6 (six) hours as needed for wheezing or shortness of breath.   360 mL   0   . ADULT MULTIVITAMIN W/MINERALS CH   Oral   Take 1 tablet by mouth daily.         Marland Kitchen POLYETHYLENE GLYCOL 3350 PO POWD   Oral   Take 17 g by mouth daily.   255 g   0   . PREDNISONE 20 MG PO TABS   Oral   Take 3 tablets (60 mg total) by mouth daily.   15 tablet   0   . TIOTROPIUM BROMIDE MONOHYDRATE 18 MCG IN CAPS   Inhalation   Place 1 capsule (18 mcg total) into inhaler and inhale daily.   30 capsule   0     Triage Vitals: BP 168/111  Pulse 77  Temp 98.7 F (37.1 C) (Oral)  Resp 26  SpO2 100%  Physical Exam  Nursing note and vitals reviewed. Constitutional: She appears well-developed and well-nourished. No distress.  HENT:  Head: Normocephalic and atraumatic.  Eyes: Conjunctivae normal are normal. Right eye exhibits no discharge. Left eye exhibits no discharge.  Neck:  Neck supple.  Cardiovascular: Normal rate, regular rhythm and normal heart sounds.  Exam reveals no gallop and no friction rub.   No murmur heard. Pulmonary/Chest: Effort normal. No respiratory distress. She has wheezes.       Mild expiratory wheezing bilaterally. Speaking in full sentences. No increased work of breathing  Abdominal: Soft. She exhibits no distension. There is no tenderness.  Musculoskeletal: She exhibits no edema and no tenderness.       Lower extremities symmetric as compared to each other. No calf tenderness. Negative Homan's. No palpable cords.   Neurological: She is alert.  Skin: Skin is warm and dry.  Psychiatric: She has a normal mood and affect. Her behavior is normal. Thought content normal.    ED Course  Procedures (including critical care time) DIAGNOSTIC STUDIES: Oxygen Saturation is 98% on room air, normal by my interpretation.    COORDINATION OF CARE: At 1215 PM Discussed treatment plan with patient which includes breathing treatment which she states has moderately improved her symptoms, prednisone and refill inhaler. Patient agrees.   Labs Reviewed - No data to display No results found.   1. Asthma exacerbation       MDM  61 year old female with mild asthma exacerbation. No significant or distress on exam. She feels much better after single treatment in emergency room. Patient was provided with an albuterol inhaler upon discharge. She received a dose of steroids and prescription for additional dose of Decadron tomorrow. She is afebrile. I doubt that she has pneumonia. Doubt pulmonary embolism or other potential emergent etiology. Return precautions were discussed. Outpatient followup as needed otherwise.  I personally preformed the services scribed in my presence. The recorded information has been reviewed and is accurate. Shelby Razor, MD.          Shelby Razor, MD 07/10/12 1018

## 2012-09-03 ENCOUNTER — Emergency Department (HOSPITAL_COMMUNITY)
Admission: EM | Admit: 2012-09-03 | Discharge: 2012-09-03 | Disposition: A | Discharge status: Home / Self Care | Payer: Medicaid Other | Attending: Emergency Medicine | Admitting: Emergency Medicine

## 2012-09-03 ENCOUNTER — Encounter (HOSPITAL_COMMUNITY): Payer: Self-pay | Admitting: *Deleted

## 2012-09-03 ENCOUNTER — Emergency Department (HOSPITAL_COMMUNITY): Payer: Medicaid Other

## 2012-09-03 DIAGNOSIS — J45909 Unspecified asthma, uncomplicated: Secondary | ICD-10-CM | POA: Insufficient documentation

## 2012-09-03 DIAGNOSIS — Z9851 Tubal ligation status: Secondary | ICD-10-CM | POA: Insufficient documentation

## 2012-09-03 DIAGNOSIS — R0789 Other chest pain: Secondary | ICD-10-CM | POA: Insufficient documentation

## 2012-09-03 DIAGNOSIS — Z8679 Personal history of other diseases of the circulatory system: Secondary | ICD-10-CM | POA: Insufficient documentation

## 2012-09-03 DIAGNOSIS — Z87891 Personal history of nicotine dependence: Secondary | ICD-10-CM | POA: Insufficient documentation

## 2012-09-03 DIAGNOSIS — Z8719 Personal history of other diseases of the digestive system: Secondary | ICD-10-CM | POA: Insufficient documentation

## 2012-09-03 DIAGNOSIS — R0602 Shortness of breath: Secondary | ICD-10-CM | POA: Insufficient documentation

## 2012-09-03 DIAGNOSIS — I10 Essential (primary) hypertension: Secondary | ICD-10-CM | POA: Insufficient documentation

## 2012-09-03 DIAGNOSIS — Z79899 Other long term (current) drug therapy: Secondary | ICD-10-CM | POA: Insufficient documentation

## 2012-09-03 LAB — BASIC METABOLIC PANEL
BUN: 15 mg/dL (ref 6–23)
Chloride: 105 mEq/L (ref 96–112)
Creatinine, Ser: 0.72 mg/dL (ref 0.50–1.10)
GFR calc Af Amer: >90 mL/min (ref >90)
GFR calc non Af Amer: 89 mL/min — ABNORMAL LOW (ref >90)
Glucose, Bld: 97 mg/dL (ref 70–99)

## 2012-09-03 LAB — CBC
HCT: 38.2 % (ref 36.0–46.0)
Hemoglobin: 12.9 g/dL (ref 12.0–15.0)
MCHC: 33.8 g/dL (ref 30.0–36.0)
MCV: 79.7 fL (ref 78.0–100.0)
RDW: 14.8 % (ref 11.5–15.5)

## 2012-09-03 MED ORDER — ALBUTEROL SULFATE (5 MG/ML) 0.5% IN NEBU
5.0000 mg | INHALATION_SOLUTION | Freq: Once | RESPIRATORY_TRACT | Status: AC
  Administered 2012-09-03: 5 mg via RESPIRATORY_TRACT
  Filled 2012-09-03: qty 1

## 2012-09-03 MED ORDER — ALBUTEROL SULFATE HFA 108 (90 BASE) MCG/ACT IN AERS
2.0000 | INHALATION_SPRAY | RESPIRATORY_TRACT | Status: DC | PRN
  Administered 2012-09-03: 2 via RESPIRATORY_TRACT
  Filled 2012-09-03: qty 6.7

## 2012-09-03 MED ORDER — ALBUTEROL SULFATE (2.5 MG/3ML) 0.083% IN NEBU: 2.5000 mg | INHALATION_SOLUTION | Freq: Four times a day (QID) | RESPIRATORY_TRACT | Status: DC | PRN

## 2012-09-03 MED ORDER — DILTIAZEM HCL ER COATED BEADS 240 MG PO TB24: 240.0000 mg | ORAL_TABLET | Freq: Every day | ORAL | Status: DC

## 2012-09-03 MED ORDER — HYDROCHLOROTHIAZIDE 25 MG PO TABS: 25.0000 mg | ORAL_TABLET | Freq: Every day | ORAL | Status: DC

## 2012-09-03 MED ORDER — IPRATROPIUM BROMIDE 0.02 % IN SOLN
0.5000 mg | Freq: Once | RESPIRATORY_TRACT | Status: AC
  Administered 2012-09-03: 0.5 mg via RESPIRATORY_TRACT
  Filled 2012-09-03: qty 2.5

## 2012-09-03 MED ORDER — TIOTROPIUM BROMIDE MONOHYDRATE 18 MCG IN CAPS: 18.0000 ug | ORAL_CAPSULE | Freq: Every day | RESPIRATORY_TRACT | Status: DC

## 2012-09-03 NOTE — ED Notes (Signed)
MD at bedside. 

## 2012-09-03 NOTE — ED Notes (Signed)
Pt reports having mid chest/upper GI pain x 1 week with sob. Has been out of meds x 1 month.

## 2012-09-03 NOTE — ED Provider Notes (Signed)
History     CSN: [id number]  Arrival date & time 09/03/12  1156   First MD Initiated Contact with Patient 09/03/12 1322      Chief Complaint  Patient presents with  . Chest Pain  . Shortness of Breath    (Consider location/radiation/quality/duration/timing/severity/associated sxs/prior treatment) HPI Comments: Patient presents with feeling tight in the chest and short of breath for the past week.  She is on cardizem for afib and hctz and has been off of both of these for the past month.    Patient is a 62 y.o. female presenting with chest pain and shortness of breath. The history is provided by the patient.  Chest Pain Episode onset: one week ago. Chest pain occurs constantly. The chest pain is unchanged. The pain is associated with breathing and coughing. The severity of the pain is moderate. The quality of the pain is described as tightness. The pain does not radiate. Chest pain is worsened by deep breathing. Primary symptoms include shortness of breath. Pertinent negatives for primary symptoms include no fever and no cough.    Shortness of Breath  Associated symptoms include chest pain and shortness of breath. Pertinent negatives include no fever and no cough.    Past Medical History  Diagnosis Date  . Asthma   . Hypertension   . GERD (gastroesophageal reflux disease)   . A-fib     Past Surgical History  Procedure Date  . Tubal ligation     Family History  Problem Relation Age of Onset  . Cancer Mother   . Cancer Father   . Heart attack Mother 98    History  Substance Use Topics  . Smoking status: Former Smoker    Quit date: 11/07/2011  . Smokeless tobacco: Never Used  . Alcohol Use: No    OB History    Grav Para Term Preterm Abortions TAB SAB Ect Mult Living                  Review of Systems  Constitutional: Negative for fever.  Respiratory: Positive for shortness of breath. Negative for cough.   Cardiovascular: Positive for chest pain.  All other  systems reviewed and are negative.    Allergies  Celecoxib  Home Medications   Current Outpatient Rx  Name  Route  Sig  Dispense  Refill  . ALBUTEROL SULFATE HFA 108 (90 BASE) MCG/ACT IN AERS   Inhalation   Inhale 2 puffs into the lungs every 4 (four) hours as needed for wheezing.   1 Inhaler   2   . DEXAMETHASONE 4 MG PO TABS   Oral   Take 4 tablets (16 mg total) by mouth once.   4 tablet   0   . DILTIAZEM HCL ER COATED BEADS 240 MG PO CP24   Oral   Take 240 mg by mouth daily.         Marland Kitchen DOCUSATE SODIUM 100 MG PO CAPS   Oral   Take 100 mg by mouth every 12 (twelve) hours.         Marland Kitchen FLUTICASONE-SALMETEROL 100-50 MCG/DOSE IN AEPB   Inhalation   Inhale 1 puff into the lungs 2 (two) times daily.   60 each   0   . LEVALBUTEROL TARTRATE 45 MCG/ACT IN AERO   Inhalation   Inhale 1-2 puffs into the lungs every 6 (six) hours as needed. Shortness of breath         . LEVALBUTEROL HCL 0.63 MG/3ML IN NEBU  Nebulization   Take 0.63 mg by nebulization every 6 (six) hours as needed. For shortness of breath         . ADULT MULTIVITAMIN W/MINERALS CH   Oral   Take 1 tablet by mouth daily.         Marland Kitchen POLYETHYLENE GLYCOL 3350 PO POWD   Oral   Take 17 g by mouth daily.         Marland Kitchen TIOTROPIUM BROMIDE MONOHYDRATE 18 MCG IN CAPS   Inhalation   Place 1 capsule (18 mcg total) into inhaler and inhale daily.   30 capsule   0     BP 155/94  Pulse 60  Temp 98.1 F (36.7 C) (Oral)  Resp 20  SpO2 100%  Physical Exam  Nursing note and vitals reviewed. Constitutional: She is oriented to person, place, and time. She appears well-developed and well-nourished. No distress.  HENT:  Head: Normocephalic and atraumatic.  Neck: Normal range of motion. Neck supple.  Cardiovascular: Normal rate and regular rhythm.  Exam reveals no gallop and no friction rub.   No murmur heard. Pulmonary/Chest: Effort normal and breath sounds normal. No respiratory distress. She has no  wheezes.  Abdominal: Soft. Bowel sounds are normal. She exhibits no distension. There is no tenderness.  Musculoskeletal: Normal range of motion.  Neurological: She is alert and oriented to person, place, and time.  Skin: Skin is warm and dry. She is not diaphoretic.    ED Course  Procedures (including critical care time)  Labs Reviewed  BASIC METABOLIC PANEL - Abnormal; Notable for the following:    Potassium 3.2 (*)     GFR calc non Af Amer 89 (*)     All other components within normal limits  PRO B NATRIURETIC PEPTIDE - Abnormal; Notable for the following:    Pro B Natriuretic peptide (BNP) 128.3 (*)     All other components within normal limits  CBC  POCT I-STAT TROPONIN I   Dg Chest 2 View  09/03/2012  *RADIOLOGY REPORT*  Clinical Data: Chest pain and shortness of breath.  CHEST - 2 VIEW  Comparison: Chest x-ray 06/10/2012.  Findings: Lungs appear hyperexpanded with flattening of the hemidiaphragms, increased retrosternal air space and pruning of the pulmonary vasculature in the periphery, suggestive of underlying COPD.  No acute consolidative airspace disease.  No pleural effusions.  No evidence of pulmonary edema.  Heart size is normal. Atherosclerosis in the thoracic aorta.  IMPRESSION: 1.  Chronic changes suggestive of mild COPD redemonstrated, as above, without radiographic evidence of acute cardiopulmonary disease. 2.  Atherosclerosis.   Original Report Authenticated By: Trudie Reed, M.D.      No diagnosis found.   Date: 09/03/2012  Rate: 73  Rhythm: normal sinus rhythm  QRS Axis: normal  Intervals: normal  ST/T Wave abnormalities: normal  Conduction Disutrbances:none  Narrative Interpretation:   Old EKG Reviewed: none available    MDM  The patients with symptoms of chest pain that is atypical for cardiac pain.  She has been out of her medications and recently got her medicaid benefits.  She wants refills of her meds.  Her workup is unremarkable and I doubt a  cardiac etiology.  There is no hypoxia and is feeling better after a neb.  I will restart her meds and she is to follow up with her pcp in the near future.         Geoffery Lyons, MD 09/03/12 [phone]

## 2012-09-03 NOTE — ED Notes (Signed)
Pt states feeling tightness in chest, very mild and states she feels like she needs breathing trt.  Pt reports being out of neb meds and dilt.

## 2012-09-03 NOTE — ED Notes (Signed)
Patient transported to X-ray 

## 2012-09-03 NOTE — ED Notes (Signed)
Pt being transported to x-ray

## 2012-09-27 ENCOUNTER — Encounter (HOSPITAL_COMMUNITY): Payer: Self-pay | Admitting: *Deleted

## 2012-09-27 ENCOUNTER — Emergency Department (HOSPITAL_COMMUNITY)
Admission: EM | Admit: 2012-09-27 | Discharge: 2012-09-27 | Disposition: A | Discharge status: Home / Self Care | Payer: Medicaid Other | Attending: Emergency Medicine | Admitting: Emergency Medicine

## 2012-09-27 DIAGNOSIS — Z8679 Personal history of other diseases of the circulatory system: Secondary | ICD-10-CM | POA: Insufficient documentation

## 2012-09-27 DIAGNOSIS — Z87891 Personal history of nicotine dependence: Secondary | ICD-10-CM | POA: Insufficient documentation

## 2012-09-27 DIAGNOSIS — IMO0002 Reserved for concepts with insufficient information to code with codable children: Secondary | ICD-10-CM | POA: Insufficient documentation

## 2012-09-27 DIAGNOSIS — Z79899 Other long term (current) drug therapy: Secondary | ICD-10-CM | POA: Insufficient documentation

## 2012-09-27 DIAGNOSIS — I10 Essential (primary) hypertension: Secondary | ICD-10-CM | POA: Insufficient documentation

## 2012-09-27 DIAGNOSIS — E876 Hypokalemia: Secondary | ICD-10-CM | POA: Insufficient documentation

## 2012-09-27 DIAGNOSIS — M62838 Other muscle spasm: Secondary | ICD-10-CM | POA: Insufficient documentation

## 2012-09-27 DIAGNOSIS — J45909 Unspecified asthma, uncomplicated: Secondary | ICD-10-CM | POA: Insufficient documentation

## 2012-09-27 LAB — POCT I-STAT, CHEM 8
BUN: 13 mg/dL (ref 6–23)
Chloride: 105 mEq/L (ref 96–112)
Creatinine, Ser: 0.7 mg/dL (ref 0.50–1.10)
Sodium: 143 mEq/L (ref 135–145)
TCO2: 30 mmol/L (ref 0–100)

## 2012-09-27 MED ORDER — POTASSIUM CHLORIDE CRYS ER 20 MEQ PO TBCR
40.0000 meq | EXTENDED_RELEASE_TABLET | Freq: Once | ORAL | Status: AC
  Administered 2012-09-27: 40 meq via ORAL
  Filled 2012-09-27: qty 2

## 2012-09-27 MED ORDER — POTASSIUM CHLORIDE CRYS ER 20 MEQ PO TBCR: 40.0000 meq | EXTENDED_RELEASE_TABLET | ORAL | Status: DC | PRN

## 2012-09-27 MED ORDER — CYCLOBENZAPRINE HCL 10 MG PO TABS
5.0000 mg | ORAL_TABLET | Freq: Once | ORAL | Status: AC
  Administered 2012-09-27: 5 mg via ORAL
  Filled 2012-09-27: qty 2

## 2012-09-27 MED ORDER — CYCLOBENZAPRINE HCL 5 MG PO TABS: 5.0000 mg | ORAL_TABLET | Freq: Three times a day (TID) | ORAL | Status: DC | PRN

## 2012-09-27 MED ORDER — OXYCODONE-ACETAMINOPHEN 5-325 MG PO TABS
1.0000 | ORAL_TABLET | Freq: Once | ORAL | Status: AC
  Administered 2012-09-27: 1 via ORAL
  Filled 2012-09-27: qty 1

## 2012-09-27 NOTE — ED Notes (Signed)
Pt arrived from home by private vehicle c/o left leg muscle spasms that radiates from upper thigh to lower calf and develops a knot just below the knee posteriorly.

## 2012-09-27 NOTE — ED Notes (Signed)
Phlebotomy tech in room

## 2012-09-27 NOTE — ED Provider Notes (Addendum)
History     CSN: [id number]  Arrival date & time 09/27/12  4098   First MD Initiated Contact with Patient 09/27/12 [phone]      Chief Complaint  Patient presents with  . Spasms    (Consider location/radiation/quality/duration/timing/severity/associated sxs/prior treatment) HPIBertha Bartlett is a 62 y.o. female past medical history significant for hypertension and asthma presents with left leg pain. She's complaining about cramping and spasms in her left thigh as well as in her left calf. She says the ball up occasionally and cause severe pain, this is worse on walking. Patient is taking hydrochlorothiazide and has a history of low potassium. Patient has no history of venous thromboembolism, she has no periods of recent inactivity, no long bone fractures no history of cancer treatment.  She denies any shortness of breath, chest pain, headache, nausea vomiting, abdominal pain, rash or arthralgias.   Past Medical History  Diagnosis Date  . Asthma   . Hypertension   . GERD (gastroesophageal reflux disease)   . A-fib     Past Surgical History  Procedure Date  . Tubal ligation     Family History  Problem Relation Age of Onset  . Cancer Mother   . Cancer Father   . Heart attack Mother 20    History  Substance Use Topics  . Smoking status: Former Smoker    Quit date: 11/07/2011  . Smokeless tobacco: Never Used  . Alcohol Use: No    OB History    Grav Para Term Preterm Abortions TAB SAB Ect Mult Living                  Review of Systems At least 10pt or greater review of systems completed and are negative except where specified in the HPI.  Allergies  Celecoxib  Home Medications   Current Outpatient Rx  Name  Route  Sig  Dispense  Refill  . ALBUTEROL SULFATE HFA 108 (90 BASE) MCG/ACT IN AERS   Inhalation   Inhale 2 puffs into the lungs every 4 (four) hours as needed for wheezing.   1 Inhaler   2   . ALBUTEROL SULFATE (2.5 MG/3ML) 0.083% IN NEBU   Nebulization  Take 3 mLs (2.5 mg total) by nebulization every 6 (six) hours as needed for wheezing.   25 mL   1   . FLUTICASONE-SALMETEROL 100-50 MCG/DOSE IN AEPB   Inhalation   Inhale 1 puff into the lungs 2 (two) times daily.   60 each   0   . HYDROCHLOROTHIAZIDE 25 MG PO TABS   Oral   Take 1 tablet (25 mg total) by mouth daily.   30 tablet   1   . TIOTROPIUM BROMIDE MONOHYDRATE 18 MCG IN CAPS   Inhalation   Place 1 capsule (18 mcg total) into inhaler and inhale daily.   30 capsule   1     BP 137/89  Pulse 70  Temp 97.5 F (36.4 C) (Oral)  Resp 18  SpO2 96%  Physical Exam  Nursing notes reviewed.  Electronic medical record reviewed. VITAL SIGNS:   Filed Vitals:   09/27/12 0551  BP: 137/89  Pulse: 70  Temp: 97.5 F (36.4 C)  TempSrc: Oral  Resp: 18  SpO2: 96%   CONSTITUTIONAL: Awake, oriented, appears non-toxic HENT: Atraumatic, normocephalic, oral mucosa pink and moist, airway patent. Nares patent without drainage. External ears normal. EYES: Conjunctiva clear, EOMI, PERRLA NECK: Trachea midline, non-tender, supple CARDIOVASCULAR: Normal heart rate, Normal rhythm, No murmurs, rubs,  gallops PULMONARY/CHEST: Clear to auscultation, no rhonchi, wheezes, or rales. Symmetrical breath sounds. Non-tender. ABDOMINAL: Non-distended, soft, non-tender - no rebound or guarding.  BS normal. NEUROLOGIC: Non-focal, moving all four extremities, no gross sensory or motor deficits. EXTREMITIES: No clubbing, cyanosis, or edema SKIN: Warm, Dry, No erythema, No rash  ED Course  Procedures (including critical care time)  Labs Reviewed  POCT I-STAT, CHEM 8 - Abnormal; Notable for the following:    Potassium 3.1 (*)     All other components within normal limits   No results found.   1. Muscle spasm   2. Hypokalemia, excessive renal losses       MDM  [name] is a 62 y.o. female presenting with calf cramping.  We'll check her potassium and she's had hypokalemia secondary to  HCTZ use previously.  Patient asks explicitly for Percocet to help her pain- I'll prescribe cyclobenzaprine 5mg  for muscle spasm.  Pt says she can't have ibuprofen.  Patient feeling better with cyclobenzaprine, potassium slightly low at 3.1. Her potassium by mouth with a 40 mEq potassium tablet. We'll write the patient a prescription for potassium tablets to be used as needed. Also give the patient prescription for muscle relaxers. Patient requesting Percocet for pain-we'll not give Percocets, and told her that she can use acetaminophen instead.  No concerns for DVT at this point.   Jones Skene, MD 09/27/12 1478  Jones Skene, MD 09/27/12 2956

## 2012-10-22 DIAGNOSIS — Z8679 Personal history of other diseases of the circulatory system: Secondary | ICD-10-CM | POA: Insufficient documentation

## 2012-10-22 DIAGNOSIS — R509 Fever, unspecified: Secondary | ICD-10-CM | POA: Insufficient documentation

## 2012-10-22 DIAGNOSIS — R197 Diarrhea, unspecified: Secondary | ICD-10-CM | POA: Insufficient documentation

## 2012-10-22 DIAGNOSIS — J189 Pneumonia, unspecified organism: Secondary | ICD-10-CM | POA: Insufficient documentation

## 2012-10-22 DIAGNOSIS — J069 Acute upper respiratory infection, unspecified: Secondary | ICD-10-CM | POA: Insufficient documentation

## 2012-10-22 DIAGNOSIS — I10 Essential (primary) hypertension: Secondary | ICD-10-CM | POA: Insufficient documentation

## 2012-10-22 DIAGNOSIS — J029 Acute pharyngitis, unspecified: Secondary | ICD-10-CM | POA: Insufficient documentation

## 2012-10-22 DIAGNOSIS — R05 Cough: Secondary | ICD-10-CM | POA: Insufficient documentation

## 2012-10-22 DIAGNOSIS — R0982 Postnasal drip: Secondary | ICD-10-CM | POA: Insufficient documentation

## 2012-10-22 DIAGNOSIS — R059 Cough, unspecified: Secondary | ICD-10-CM | POA: Insufficient documentation

## 2012-10-22 DIAGNOSIS — R5381 Other malaise: Secondary | ICD-10-CM | POA: Insufficient documentation

## 2012-10-22 DIAGNOSIS — Z87891 Personal history of nicotine dependence: Secondary | ICD-10-CM | POA: Insufficient documentation

## 2012-10-22 DIAGNOSIS — R079 Chest pain, unspecified: Secondary | ICD-10-CM | POA: Insufficient documentation

## 2012-10-22 DIAGNOSIS — J45901 Unspecified asthma with (acute) exacerbation: Secondary | ICD-10-CM | POA: Insufficient documentation

## 2012-10-22 DIAGNOSIS — K219 Gastro-esophageal reflux disease without esophagitis: Secondary | ICD-10-CM | POA: Insufficient documentation

## 2012-10-22 DIAGNOSIS — J3489 Other specified disorders of nose and nasal sinuses: Secondary | ICD-10-CM | POA: Insufficient documentation

## 2012-10-22 DIAGNOSIS — R110 Nausea: Secondary | ICD-10-CM | POA: Insufficient documentation

## 2012-10-22 DIAGNOSIS — R51 Headache: Secondary | ICD-10-CM | POA: Insufficient documentation

## 2012-10-22 NOTE — ED Notes (Signed)
Denies emesis

## 2012-10-22 NOTE — ED Notes (Signed)
Pt states aching for 4-5 days. Nausea, diarrhea, chills.

## 2012-10-23 ENCOUNTER — Emergency Department (HOSPITAL_COMMUNITY)
Admission: EM | Admit: 2012-10-23 | Discharge: 2012-10-23 | Disposition: A | Discharge status: Home / Self Care | Payer: No Typology Code available for payment source | Attending: Emergency Medicine | Admitting: Emergency Medicine

## 2012-10-23 ENCOUNTER — Emergency Department (HOSPITAL_COMMUNITY): Payer: No Typology Code available for payment source

## 2012-10-23 DIAGNOSIS — J189 Pneumonia, unspecified organism: Secondary | ICD-10-CM

## 2012-10-23 DIAGNOSIS — I10 Essential (primary) hypertension: Secondary | ICD-10-CM

## 2012-10-23 DIAGNOSIS — J069 Acute upper respiratory infection, unspecified: Secondary | ICD-10-CM

## 2012-10-23 DIAGNOSIS — K219 Gastro-esophageal reflux disease without esophagitis: Secondary | ICD-10-CM

## 2012-10-23 DIAGNOSIS — J45909 Unspecified asthma, uncomplicated: Secondary | ICD-10-CM

## 2012-10-23 MED ORDER — OMEPRAZOLE 20 MG PO CPDR: 20.0000 mg | DELAYED_RELEASE_CAPSULE | Freq: Every day | ORAL | Status: DC

## 2012-10-23 MED ORDER — ONDANSETRON 4 MG PO TBDP
8.0000 mg | ORAL_TABLET | Freq: Once | ORAL | Status: AC
  Administered 2012-10-23: 8 mg via ORAL
  Filled 2012-10-23: qty 2

## 2012-10-23 MED ORDER — HYDROCODONE-ACETAMINOPHEN 5-325 MG PO TABS
2.0000 | ORAL_TABLET | Freq: Once | ORAL | Status: AC
  Administered 2012-10-23: 2 via ORAL
  Filled 2012-10-23: qty 2

## 2012-10-23 MED ORDER — AZITHROMYCIN 250 MG PO TABS: 250.0000 mg | ORAL_TABLET | Freq: Every day | ORAL | Status: DC

## 2012-10-23 MED ORDER — AZITHROMYCIN 250 MG PO TABS
500.0000 mg | ORAL_TABLET | Freq: Once | ORAL | Status: AC
  Administered 2012-10-23: 500 mg via ORAL
  Filled 2012-10-23: qty 2

## 2012-10-23 MED ORDER — HYDROCHLOROTHIAZIDE 25 MG PO TABS: 25.0000 mg | ORAL_TABLET | Freq: Every day | ORAL | Status: DC

## 2012-10-23 MED ORDER — ALBUTEROL SULFATE HFA 108 (90 BASE) MCG/ACT IN AERS
2.0000 | INHALATION_SPRAY | Freq: Four times a day (QID) | RESPIRATORY_TRACT | Status: DC | PRN
  Administered 2012-10-23: 2 via RESPIRATORY_TRACT
  Filled 2012-10-23: qty 6.7

## 2012-10-23 MED ORDER — OXYMETAZOLINE HCL 0.05 % NA SOLN: 1.0000 | Freq: Once | NASAL | Status: DC

## 2012-10-23 MED ORDER — GI COCKTAIL ~~LOC~~
30.0000 mL | Freq: Once | ORAL | Status: AC
  Administered 2012-10-23: 30 mL via ORAL
  Filled 2012-10-23: qty 30

## 2012-10-23 MED ORDER — HYDROCODONE-ACETAMINOPHEN 5-325 MG PO TABS: 2.0000 | ORAL_TABLET | ORAL | Status: DC | PRN

## 2012-10-23 NOTE — ED Provider Notes (Signed)
History     CSN: [id number]  Arrival date & time 10/22/12  2251   First MD Initiated Contact with Patient 10/23/12 0104      Chief Complaint  Patient presents with  . Influenza    (Consider location/radiation/quality/duration/timing/severity/associated sxs/prior treatment) Patient is a 62 y.o. female presenting with flu symptoms. The history is provided by the patient and medical records. No language interpreter was used.  Influenza Presenting symptoms: cough, diarrhea, fatigue, fever, headache, nausea, rhinorrhea, shortness of breath and sore throat   Presenting symptoms: no myalgias and no vomiting   Associated symptoms: nasal congestion   Associated symptoms: no chills, no ear pain and no neck stiffness     Tabitha Riggins is a 62 y.o. female  with a hx of HTN (pt is not on medication b/c she felt it was too strong), GERD and asthma presents to the Emergency Department complaining of gradual, persistent, progressively worsening cough, congestion onset 5 days ago.  Associated symptoms include sinus congestion, headache in the fontal sinus, dry/hacky cough, nausea, diarrhea, fever.  Pt did not get a flu shot this year.  Pt has been using alka seltzer cold medicine with only mild relief.  Nothing makes it better and nothing makes it worse.  Pt denies neck pain, chest pain, shortness of breath, abdominal pain, vomiting, weakness, dizziness, syncope, dysuria.     PCP: [facility] and pt has not seen anyone else   Past Medical History  Diagnosis Date  . Asthma   . Hypertension   . GERD (gastroesophageal reflux disease)   . A-fib     Past Surgical History  Procedure Laterality Date  . Tubal ligation      Family History  Problem Relation Age of Onset  . Cancer Mother   . Cancer Father   . Heart attack Mother 26    History  Substance Use Topics  . Smoking status: Former Smoker    Quit date: 11/07/2011  . Smokeless tobacco: Never Used  . Alcohol Use: No    OB History   Grav Para Term Preterm Abortions TAB SAB Ect Mult Living                  Review of Systems  Constitutional: Positive for fever and fatigue. Negative for chills and appetite change.  HENT: Positive for congestion, sore throat, rhinorrhea, postnasal drip and sinus pressure. Negative for ear pain, mouth sores, neck stiffness and ear discharge.   Eyes: Negative for visual disturbance.  Respiratory: Positive for cough, chest tightness, shortness of breath and wheezing. Negative for stridor.   Cardiovascular: Negative for chest pain, palpitations and leg swelling.  Gastrointestinal: Positive for nausea and diarrhea. Negative for vomiting and abdominal pain.  Genitourinary: Negative for dysuria, urgency, frequency and hematuria.  Musculoskeletal: Negative for myalgias, back pain and arthralgias.  Skin: Negative for rash.  Neurological: Positive for headaches. Negative for syncope, light-headedness and numbness.  Hematological: Negative for adenopathy.  Psychiatric/Behavioral: The patient is not nervous/anxious.   All other systems reviewed and are negative.    Allergies  Celecoxib  Home Medications   Current Outpatient Rx  Name  Route  Sig  Dispense  Refill  . albuterol (PROVENTIL HFA;VENTOLIN HFA) 108 (90 BASE) MCG/ACT inhaler   Inhalation   Inhale 2 puffs into the lungs every 4 (four) hours as needed for wheezing.   1 Inhaler   2   . cyclobenzaprine (FLEXERIL) 5 MG tablet   Oral   Take 1 tablet (5 mg total)  by mouth 3 (three) times daily as needed for muscle spasms.   15 tablet   0   . potassium chloride SA (K-DUR,KLOR-CON) 20 MEQ tablet   Oral   Take 2 tablets (40 mEq total) by mouth as needed. For muscle cramps.  Do not take more than 2 tablets per day unless directed by a physician.   10 tablet   0   . tiotropium (SPIRIVA HANDIHALER) 18 MCG inhalation capsule   Inhalation   Place 1 capsule (18 mcg total) into inhaler and inhale daily.   30 capsule   1   . albuterol  (PROVENTIL) (2.5 MG/3ML) 0.083% nebulizer solution   Nebulization   Take 3 mLs (2.5 mg total) by nebulization every 6 (six) hours as needed for wheezing.   25 mL   1   . azithromycin (ZITHROMAX) 250 MG tablet   Oral   Take 1 tablet (250 mg total) by mouth daily. Take first 2 tablets together, then 1 every day until finished.   6 tablet   0   . hydrochlorothiazide (HYDRODIURIL) 25 MG tablet   Oral   Take 1 tablet (25 mg total) by mouth daily.   30 tablet   0   . HYDROcodone-acetaminophen (NORCO/VICODIN) 5-325 MG per tablet   Oral   Take 2 tablets by mouth every 4 (four) hours as needed for pain.   10 tablet   0   . omeprazole (PRILOSEC) 20 MG capsule   Oral   Take 1 capsule (20 mg total) by mouth daily.   30 capsule   0     BP 145/86  Pulse 73  Temp(Src) 98 F (36.7 C) (Oral)  Resp 18  SpO2 94%  Physical Exam  Constitutional: She is oriented to person, place, and time. She appears well-developed and well-nourished. No distress.  HENT:  Head: Normocephalic and atraumatic.  Right Ear: Tympanic membrane, external ear and ear canal normal.  Left Ear: Tympanic membrane, external ear and ear canal normal.  Nose: Mucosal edema and rhinorrhea present. No epistaxis. Right sinus exhibits no maxillary sinus tenderness and no frontal sinus tenderness. Left sinus exhibits no maxillary sinus tenderness and no frontal sinus tenderness.  Mouth/Throat: Uvula is midline and mucous membranes are normal. Mucous membranes are not pale and not cyanotic. No oropharyngeal exudate, posterior oropharyngeal edema, posterior oropharyngeal erythema or tonsillar abscesses.  Eyes: Conjunctivae are normal. Pupils are equal, round, and reactive to light.  Neck: Normal range of motion and full passive range of motion without pain.  Cardiovascular: Normal rate and intact distal pulses.   Pulmonary/Chest: Effort normal and breath sounds normal. No stridor. No respiratory distress. She has no wheezes.  She has no rales. She exhibits no tenderness.  Abdominal: Soft. Bowel sounds are normal. She exhibits no distension. There is no tenderness. There is no rebound.  Musculoskeletal: Normal range of motion.  Lymphadenopathy:    She has no cervical adenopathy.  Neurological: She is alert and oriented to person, place, and time.  Skin: Skin is dry. No rash noted. She is not diaphoretic.  Psychiatric: She has a normal mood and affect.    ED Course  Procedures (including critical care time)  Labs Reviewed - No data to display Dg Chest 2 View  10/23/2012  *RADIOLOGY REPORT*  Clinical Data: Cough, congestion and shortness of breath.  CHEST - 2 VIEW  Comparison: Chest radiograph performed 09/03/2012  Findings: The lungs are hyperexpanded, with flattening of the hemidiaphragms, suggestive of COPD.  Minimal  opacity is suggested near the right lung apex, which may reflect mild pneumonia.  No pleural effusion or pneumothorax is seen.  The heart is normal in size; the mediastinal contour is within normal limits.  No acute osseous abnormalities are seen.  IMPRESSION:  1.  Minimal opacity suggested near the right lung apex, which may reflect mild pneumonia. 2.  Findings of COPD.   Original Report Authenticated By: Tonia Ghent, M.D.      1. HTN (hypertension)   2. CAP (community acquired pneumonia)   3. URI (upper respiratory infection)   4. GERD (gastroesophageal reflux disease)   5. ASTHMA       MDM  Dion Body presents with URI and viral syndrome.  Patient has been diagnosed with CAP via chest xray. Pt is not ill appearing, she does not hypoxia, she's not tachycardic, or immunocompromised, therefore I feel like the they can be treated as an OP with abx therapy. Pt has been advised to return to the ED if symptoms worsen or they do not improve. Pt verbalizes understanding and is agreeable with plan. Patient initially hypertensive on evaluation. Her blood pressure has decreased spontaneously but will  give her refill of her blood pressure medications. I discussed with her at length that she needs to find a primary care physician and she is agreeable.    1. Medications: azithromycin, HCTZ, omeprazole, vicodin, usual home medications 2. Treatment: rest, drink plenty of fluids, take medication as prescribed 3. Follow Up: Please followup with your primary doctor for discussion of your diagnoses and further evaluation after today's visit; if you do not have a primary care doctor use the resource guide provided to find one;        Dierdre Forth, PA-C 10/23/12 1027

## 2012-10-23 NOTE — ED Provider Notes (Signed)
Medical screening examination/treatment/procedure(s) were performed by non-physician practitioner and as supervising physician I was immediately available for consultation/collaboration. Devoria Albe, MD, FACEP   Ward Givens, MD 10/23/12 [phone]

## 2012-12-06 ENCOUNTER — Encounter (HOSPITAL_COMMUNITY): Payer: Self-pay | Admitting: Family Medicine

## 2012-12-06 ENCOUNTER — Emergency Department (HOSPITAL_COMMUNITY): Payer: Medicaid Other

## 2012-12-06 ENCOUNTER — Emergency Department (HOSPITAL_COMMUNITY)
Admission: EM | Admit: 2012-12-06 | Discharge: 2012-12-06 | Disposition: A | Discharge status: Home / Self Care | Payer: Medicaid Other | Attending: Emergency Medicine | Admitting: Emergency Medicine

## 2012-12-06 DIAGNOSIS — Z87891 Personal history of nicotine dependence: Secondary | ICD-10-CM | POA: Insufficient documentation

## 2012-12-06 DIAGNOSIS — R079 Chest pain, unspecified: Secondary | ICD-10-CM | POA: Insufficient documentation

## 2012-12-06 DIAGNOSIS — I10 Essential (primary) hypertension: Secondary | ICD-10-CM | POA: Insufficient documentation

## 2012-12-06 DIAGNOSIS — K219 Gastro-esophageal reflux disease without esophagitis: Secondary | ICD-10-CM | POA: Insufficient documentation

## 2012-12-06 DIAGNOSIS — J45901 Unspecified asthma with (acute) exacerbation: Secondary | ICD-10-CM | POA: Insufficient documentation

## 2012-12-06 DIAGNOSIS — Z8701 Personal history of pneumonia (recurrent): Secondary | ICD-10-CM | POA: Insufficient documentation

## 2012-12-06 LAB — POCT I-STAT, CHEM 8
Calcium, Ion: 1.21 mmol/L (ref 1.13–1.30)
Glucose, Bld: 101 mg/dL — ABNORMAL HIGH (ref 70–99)
HCT: 41 % (ref 36.0–46.0)
Hemoglobin: 13.9 g/dL (ref 12.0–15.0)
Potassium: 3.1 mEq/L — ABNORMAL LOW (ref 3.5–5.1)

## 2012-12-06 LAB — CBC WITH DIFFERENTIAL/PLATELET
Basophils Absolute: 0 10*3/uL (ref 0.0–0.1)
Basophils Relative: 1 % (ref 0–1)
Eosinophils Absolute: 0.3 10*3/uL (ref 0.0–0.7)
Hemoglobin: 13.4 g/dL (ref 12.0–15.0)
MCH: 27.3 pg (ref 26.0–34.0)
MCHC: 35.4 g/dL (ref 30.0–36.0)
Monocytes Relative: 9 % (ref 3–12)
Neutrophils Relative %: 52 % (ref 43–77)
RDW: 14.9 % (ref 11.5–15.5)

## 2012-12-06 LAB — POCT I-STAT TROPONIN I: Troponin i, poc: 0 ng/mL (ref 0.00–0.08)

## 2012-12-06 MED ORDER — PREDNISONE 20 MG PO TABS
60.0000 mg | ORAL_TABLET | Freq: Once | ORAL | Status: AC
  Administered 2012-12-06: 60 mg via ORAL
  Filled 2012-12-06: qty 2
  Filled 2012-12-06: qty 1

## 2012-12-06 MED ORDER — ALBUTEROL SULFATE HFA 108 (90 BASE) MCG/ACT IN AERS
2.0000 | INHALATION_SPRAY | RESPIRATORY_TRACT | Status: DC | PRN
  Administered 2012-12-06: 2 via RESPIRATORY_TRACT
  Filled 2012-12-06: qty 6.7

## 2012-12-06 MED ORDER — PREDNISONE 20 MG PO TABS: 40.0000 mg | ORAL_TABLET | Freq: Every day | ORAL | Status: DC

## 2012-12-06 MED ORDER — HYDROCHLOROTHIAZIDE 12.5 MG PO TABS: 12.5000 mg | ORAL_TABLET | Freq: Every day | ORAL | Status: DC

## 2012-12-06 MED ORDER — ALBUTEROL SULFATE (5 MG/ML) 0.5% IN NEBU
5.0000 mg | INHALATION_SOLUTION | RESPIRATORY_TRACT | Status: DC | PRN
  Administered 2012-12-06: 5 mg via RESPIRATORY_TRACT
  Filled 2012-12-06: qty 1

## 2012-12-06 MED ORDER — ALBUTEROL SULFATE (2.5 MG/3ML) 0.083% IN NEBU: 2.5000 mg | INHALATION_SOLUTION | Freq: Four times a day (QID) | RESPIRATORY_TRACT | Status: DC | PRN

## 2012-12-06 MED ORDER — POTASSIUM CHLORIDE CRYS ER 20 MEQ PO TBCR
40.0000 meq | EXTENDED_RELEASE_TABLET | Freq: Once | ORAL | Status: AC
  Administered 2012-12-06: 40 meq via ORAL
  Filled 2012-12-06: qty 2

## 2012-12-06 MED ORDER — POTASSIUM CHLORIDE ER 10 MEQ PO TBCR: 20.0000 meq | EXTENDED_RELEASE_TABLET | Freq: Two times a day (BID) | ORAL | Status: DC

## 2012-12-06 MED ORDER — ALBUTEROL SULFATE HFA 108 (90 BASE) MCG/ACT IN AERS: 2.0000 | INHALATION_SPRAY | RESPIRATORY_TRACT | Status: DC | PRN

## 2012-12-06 MED ORDER — TIOTROPIUM BROMIDE MONOHYDRATE 18 MCG IN CAPS: 18.0000 ug | ORAL_CAPSULE | Freq: Every day | RESPIRATORY_TRACT | Status: DC

## 2012-12-06 MED ORDER — IPRATROPIUM BROMIDE 0.02 % IN SOLN
0.5000 mg | Freq: Once | RESPIRATORY_TRACT | Status: AC
  Administered 2012-12-06: 0.5 mg via RESPIRATORY_TRACT
  Filled 2012-12-06: qty 2.5

## 2012-12-06 NOTE — ED Notes (Signed)
Pt discharge.Vital signs stable and GCS 15. 

## 2012-12-06 NOTE — ED Notes (Addendum)
Per pt sts chest pain and SOB with congestion x 1 week. sts hasn't been taking heart medication. sts non-productive cough. pt SOB when talking. sts she also thinks her BP is up. sts used albuterol at home and chest tightened up.

## 2012-12-06 NOTE — ED Provider Notes (Signed)
History     CSN: [id number]  Arrival date & time 12/06/12  1141   First MD Initiated Contact with Patient 12/06/12 1150      Chief Complaint  Patient presents with  . Chest Pain  . Shortness of Breath    (Consider location/radiation/quality/duration/timing/severity/associated sxs/prior treatment) HPI Comments: Patient presents emergency department with chief complaint of shortness of breath and chest tightness times one week. She was seen about a month ago, and diagnosed with pneumonia. She was given azithromycin, and her symptoms improved. She also has a history of asthma, and has noticed increased wheezing. She has been out of her albuterol inhaler, but has had nebulizer treatments. She states that the nebulizer treatments have no longer been helping, and that she has noticed increased wheezing and shortness of breath. She denies fever, chills, or radiating chest pain.  The history is provided by the patient. No language interpreter was used.    Past Medical History  Diagnosis Date  . Asthma   . Hypertension   . GERD (gastroesophageal reflux disease)   . A-fib     Past Surgical History  Procedure Laterality Date  . Tubal ligation      Family History  Problem Relation Age of Onset  . Cancer Mother   . Cancer Father   . Heart attack Mother 41    History  Substance Use Topics  . Smoking status: Former Smoker    Quit date: 11/07/2011  . Smokeless tobacco: Never Used  . Alcohol Use: No    OB History   Grav Para Term Preterm Abortions TAB SAB Ect Mult Living                  Review of Systems  All other systems reviewed and are negative.    Allergies  Celecoxib  Home Medications   Current Outpatient Rx  Name  Route  Sig  Dispense  Refill  . albuterol (PROVENTIL HFA;VENTOLIN HFA) 108 (90 BASE) MCG/ACT inhaler   Inhalation   Inhale 2 puffs into the lungs every 4 (four) hours as needed for wheezing.   1 Inhaler   2   . albuterol (PROVENTIL) (2.5  MG/3ML) 0.083% nebulizer solution   Nebulization   Take 3 mLs (2.5 mg total) by nebulization every 6 (six) hours as needed for wheezing.   25 mL   1   . hydrochlorothiazide (HYDRODIURIL) 25 MG tablet   Oral   Take 1 tablet (25 mg total) by mouth daily.   30 tablet   0   . omeprazole (PRILOSEC) 20 MG capsule   Oral   Take 1 capsule (20 mg total) by mouth daily.   30 capsule   0   . potassium chloride SA (K-DUR,KLOR-CON) 20 MEQ tablet   Oral   Take 2 tablets (40 mEq total) by mouth as needed. For muscle cramps.  Do not take more than 2 tablets per day unless directed by a physician.   10 tablet   0   . tiotropium (SPIRIVA HANDIHALER) 18 MCG inhalation capsule   Inhalation   Place 1 capsule (18 mcg total) into inhaler and inhale daily.   30 capsule   1     BP 171/104  Pulse 83  Temp(Src) 97.4 F (36.3 C) (Oral)  Resp 16  SpO2 97%  Physical Exam  Nursing note and vitals reviewed. Constitutional: She is oriented to person, place, and time. She appears well-developed and well-nourished.  HENT:  Head: Normocephalic and atraumatic.  Eyes:  Conjunctivae and EOM are normal. Pupils are equal, round, and reactive to light.  Neck: Normal range of motion. Neck supple.  Cardiovascular: Normal rate, regular rhythm and normal heart sounds.  Exam reveals no gallop and no friction rub.   No murmur heard. Pulmonary/Chest: Effort normal. No respiratory distress. She has wheezes. She has no rales. She exhibits no tenderness.  Bilateral end expiratory wheezes  Abdominal: Soft. Bowel sounds are normal. She exhibits no distension and no mass. There is no tenderness. There is no rebound and no guarding.  Musculoskeletal: Normal range of motion. She exhibits no edema and no tenderness.  Neurological: She is alert and oriented to person, place, and time.  Skin: Skin is warm and dry.  Psychiatric: She has a normal mood and affect. Her behavior is normal. Judgment and thought content normal.     ED Course  Procedures (including critical care time)  Labs Reviewed  CBC WITH DIFFERENTIAL   Results for orders placed during the hospital encounter of 12/06/12  CBC WITH DIFFERENTIAL      Result Value Range   WBC 4.8  4.0 - 10.5 K/uL   RBC 4.90  3.87 - 5.11 MIL/uL   Hemoglobin 13.4  12.0 - 15.0 g/dL   HCT 16.1  09.6 - 04.5 %   MCV 77.3 (*) 78.0 - 100.0 fL   MCH 27.3  26.0 - 34.0 pg   MCHC 35.4  30.0 - 36.0 g/dL   RDW 40.9  81.1 - 91.4 %   Platelets 207  150 - 400 K/uL   Neutrophils Relative 52  43 - 77 %   Neutro Abs 2.5  1.7 - 7.7 K/uL   Lymphocytes Relative 32  12 - 46 %   Lymphs Abs 1.5  0.7 - 4.0 K/uL   Monocytes Relative 9  3 - 12 %   Monocytes Absolute 0.4  0.1 - 1.0 K/uL   Eosinophils Relative 6 (*) 0 - 5 %   Eosinophils Absolute 0.3  0.0 - 0.7 K/uL   Basophils Relative 1  0 - 1 %   Basophils Absolute 0.0  0.0 - 0.1 K/uL  POCT I-STAT, CHEM 8      Result Value Range   Sodium 144  135 - 145 mEq/L   Potassium 3.1 (*) 3.5 - 5.1 mEq/L   Chloride 105  96 - 112 mEq/L   BUN 14  6 - 23 mg/dL   Creatinine, Ser 7.82  0.50 - 1.10 mg/dL   Glucose, Bld 956 (*) 70 - 99 mg/dL   Calcium, Ion 2.13  0.86 - 1.30 mmol/L   TCO2 27  0 - 100 mmol/L   Hemoglobin 13.9  12.0 - 15.0 g/dL   HCT 57.8  46.9 - 62.9 %  POCT I-STAT TROPONIN I      Result Value Range   Troponin i, poc 0.00  0.00 - 0.08 ng/mL   Comment 3            Dg Chest 2 View  12/06/2012  *RADIOLOGY REPORT*  Clinical Data: Shortness of breath, cough, recent pneumonia  CHEST - 2 VIEW  Comparison: 10/23/2012  Findings: Cardiomediastinal silhouette is stable.  No acute infiltrate or pleural effusion.  No pulmonary edema. Hyperinflation again noted.  Bony thorax is stable.  IMPRESSION: No active disease.  Hyperinflation again noted.   Original Report Authenticated By: Natasha Mead, M.D.      ED ECG REPORT  I personally interpreted this EKG   Date: 12/06/2012  Rate: 74  Rhythm: normal sinus rhythm  QRS Axis: normal   Intervals: normal  ST/T Wave abnormalities: nonspecific T wave changes  Conduction Disutrbances:none  Narrative Interpretation:   Old EKG Reviewed: changes noted, new borderline T-wave abnormalities in ant-lat leads    1. Asthma exacerbation       MDM  Patient with shortness of breath and chest tightness. She was wheezing on exam. Will give nebulizer treatment and prednisone. Will check chest x-ray, and will reevaluate.  Doubt ACS, as the patient denies chest pain, or diaphoresis. There is also no exertional component. Doubt PE, as the patient is not cardiac, and she has no history. Suspect that this is asthma exacerbation, brought on by allergies.  Patient ambulated in ED with O2 saturations maintained >90, no current signs of respiratory distress. Lung exam improved after nebulizer treatment. Prednisone given in the ED and pt will bd dc with 5 day burst. Pt states they are breathing at baseline. Pt has been instructed to continue using prescribed medications and to speak with PCP about today's exacerbation.          Roxy Horseman, PA-C 12/06/12 1349

## 2012-12-06 NOTE — ED Provider Notes (Signed)
Medical screening examination/treatment/procedure(s) were performed by non-physician practitioner and as supervising physician I was immediately available for consultation/collaboration.  Marwan T Powers, MD 12/06/12 1622 

## 2012-12-20 ENCOUNTER — Encounter: Payer: Self-pay | Admitting: Nurse Practitioner

## 2012-12-20 NOTE — Progress Notes (Signed)
This encounter was created in error - please disregard.

## 2013-01-16 ENCOUNTER — Emergency Department (HOSPITAL_COMMUNITY): Payer: Medicaid Other

## 2013-01-16 ENCOUNTER — Encounter (HOSPITAL_COMMUNITY): Payer: Self-pay

## 2013-01-16 ENCOUNTER — Emergency Department (HOSPITAL_COMMUNITY)
Admission: EM | Admit: 2013-01-16 | Discharge: 2013-01-16 | Disposition: A | Discharge status: Home / Self Care | Payer: Medicaid Other | Attending: Emergency Medicine | Admitting: Emergency Medicine

## 2013-01-16 DIAGNOSIS — R059 Cough, unspecified: Secondary | ICD-10-CM | POA: Insufficient documentation

## 2013-01-16 DIAGNOSIS — I10 Essential (primary) hypertension: Secondary | ICD-10-CM | POA: Insufficient documentation

## 2013-01-16 DIAGNOSIS — K219 Gastro-esophageal reflux disease without esophagitis: Secondary | ICD-10-CM | POA: Insufficient documentation

## 2013-01-16 DIAGNOSIS — J45901 Unspecified asthma with (acute) exacerbation: Secondary | ICD-10-CM

## 2013-01-16 DIAGNOSIS — Z8679 Personal history of other diseases of the circulatory system: Secondary | ICD-10-CM | POA: Insufficient documentation

## 2013-01-16 DIAGNOSIS — R1013 Epigastric pain: Secondary | ICD-10-CM

## 2013-01-16 DIAGNOSIS — Z79899 Other long term (current) drug therapy: Secondary | ICD-10-CM | POA: Insufficient documentation

## 2013-01-16 DIAGNOSIS — Z87891 Personal history of nicotine dependence: Secondary | ICD-10-CM | POA: Insufficient documentation

## 2013-01-16 DIAGNOSIS — J3489 Other specified disorders of nose and nasal sinuses: Secondary | ICD-10-CM | POA: Insufficient documentation

## 2013-01-16 DIAGNOSIS — R12 Heartburn: Secondary | ICD-10-CM | POA: Insufficient documentation

## 2013-01-16 DIAGNOSIS — E876 Hypokalemia: Secondary | ICD-10-CM

## 2013-01-16 DIAGNOSIS — R05 Cough: Secondary | ICD-10-CM | POA: Insufficient documentation

## 2013-01-16 LAB — POCT I-STAT, CHEM 8
BUN: 12 mg/dL (ref 6–23)
Calcium, Ion: 1.23 mmol/L (ref 1.13–1.30)
Chloride: 107 meq/L (ref 96–112)
Creatinine, Ser: 0.7 mg/dL (ref 0.50–1.10)
Glucose, Bld: 123 mg/dL — ABNORMAL HIGH (ref 70–99)
HCT: 45 % (ref 36.0–46.0)
Hemoglobin: 15.3 g/dL — ABNORMAL HIGH (ref 12.0–15.0)
Potassium: 2.7 meq/L — CL (ref 3.5–5.1)
Sodium: 143 meq/L (ref 135–145)
TCO2: 30 mmol/L (ref 0–100)

## 2013-01-16 LAB — POCT I-STAT TROPONIN I: Troponin i, poc: 0 ng/mL (ref 0.00–0.08)

## 2013-01-16 MED ORDER — ALBUTEROL SULFATE HFA 108 (90 BASE) MCG/ACT IN AERS
1.0000 | INHALATION_SPRAY | Freq: Four times a day (QID) | RESPIRATORY_TRACT | Status: DC | PRN
  Administered 2013-01-16: 2 via RESPIRATORY_TRACT
  Filled 2013-01-16: qty 6.7

## 2013-01-16 MED ORDER — PREDNISONE 20 MG PO TABS
40.0000 mg | ORAL_TABLET | Freq: Once | ORAL | Status: AC
  Administered 2013-01-16: 40 mg via ORAL
  Filled 2013-01-16: qty 2

## 2013-01-16 MED ORDER — IPRATROPIUM BROMIDE 0.02 % IN SOLN
0.5000 mg | Freq: Once | RESPIRATORY_TRACT | Status: AC
  Administered 2013-01-16: 0.5 mg via RESPIRATORY_TRACT
  Filled 2013-01-16: qty 2.5

## 2013-01-16 MED ORDER — POTASSIUM CHLORIDE CRYS ER 20 MEQ PO TBCR
40.0000 meq | EXTENDED_RELEASE_TABLET | Freq: Once | ORAL | Status: AC
  Administered 2013-01-16: 40 meq via ORAL
  Filled 2013-01-16: qty 2

## 2013-01-16 MED ORDER — GI COCKTAIL ~~LOC~~
30.0000 mL | Freq: Once | ORAL | Status: AC
  Administered 2013-01-16: 30 mL via ORAL
  Filled 2013-01-16: qty 30

## 2013-01-16 MED ORDER — ALBUTEROL (5 MG/ML) CONTINUOUS INHALATION SOLN: 15.0000 mg/h | INHALATION_SOLUTION | RESPIRATORY_TRACT | Status: DC

## 2013-01-16 MED ORDER — PANTOPRAZOLE SODIUM 40 MG PO TBEC
40.0000 mg | DELAYED_RELEASE_TABLET | Freq: Once | ORAL | Status: AC
  Administered 2013-01-16: 40 mg via ORAL
  Filled 2013-01-16: qty 1

## 2013-01-16 MED ORDER — IPRATROPIUM BROMIDE 0.02 % IN SOLN
RESPIRATORY_TRACT | Status: AC
  Administered 2013-01-16: 0.5 mg via RESPIRATORY_TRACT
  Filled 2013-01-16: qty 2.5

## 2013-01-16 MED ORDER — PREDNISONE 20 MG PO TABS: 40.0000 mg | ORAL_TABLET | Freq: Every day | ORAL | Status: DC

## 2013-01-16 MED ORDER — ALBUTEROL SULFATE HFA 108 (90 BASE) MCG/ACT IN AERS
INHALATION_SPRAY | RESPIRATORY_TRACT | Status: AC
  Filled 2013-01-16: qty 6.7

## 2013-01-16 MED ORDER — PANTOPRAZOLE SODIUM 40 MG PO TBEC: 40.0000 mg | DELAYED_RELEASE_TABLET | Freq: Every day | ORAL | Status: DC

## 2013-01-16 MED ORDER — ALBUTEROL (5 MG/ML) CONTINUOUS INHALATION SOLN
INHALATION_SOLUTION | RESPIRATORY_TRACT | Status: AC
  Administered 2013-01-16: 15 mg/h via RESPIRATORY_TRACT
  Filled 2013-01-16: qty 20

## 2013-01-16 MED ORDER — POTASSIUM CHLORIDE ER 10 MEQ PO TBCR: 10.0000 meq | EXTENDED_RELEASE_TABLET | Freq: Two times a day (BID) | ORAL | Status: DC

## 2013-01-16 MED ORDER — ALBUTEROL SULFATE (5 MG/ML) 0.5% IN NEBU
5.0000 mg | INHALATION_SOLUTION | Freq: Once | RESPIRATORY_TRACT | Status: AC
  Administered 2013-01-16: 5 mg via RESPIRATORY_TRACT
  Filled 2013-01-16: qty 1

## 2013-01-16 MED ORDER — IPRATROPIUM BROMIDE 0.02 % IN SOLN: 0.5000 mg | RESPIRATORY_TRACT | Status: AC

## 2013-01-16 NOTE — ED Notes (Signed)
Woke up this am with congestion, headache,  Hx of asthma, tried to do a breathing treatment and began having a sharp pain in the center of the chest.  Presently her chest feels sore.  Non-radiating.  Denies any n/v  Reports having sob and dizziness. Skin is w/d. Resp.e/u

## 2013-01-16 NOTE — ED Notes (Addendum)
C/o prod cough, nasal congestion x 3 days. SOB, wheezing since last night, reports neb & inhalers at home not helping. C/o episode sharp right lower CP this morning, denies CP presently, only "soreness" from the coughing, worse with deep breaths, coughing & mvmt.  Resp e/u, no distress

## 2013-01-16 NOTE — ED Notes (Signed)
Pt given and instructed on Albuterol MDI to take home per Dr. Oletta Lamas.

## 2013-01-16 NOTE — ED Provider Notes (Signed)
History     CSN: [id number]  Arrival date & time 01/16/13  2440   First MD Initiated Contact with Patient 01/16/13 [phone]      Chief Complaint  Patient presents with  . Chest Pain    (Consider location/radiation/quality/duration/timing/severity/associated sxs/prior treatment) HPI Comments: Pt with long h/o asthma, is out of her inhaler, reports after using nebulizer, pt had symptoms of dyspepsia, has a severe h/o GERD, had severe discomfort in upper epigastrium region.  No sweats, fevers, nausea, vomiting.  No priro h/o CAD.  Has had allergies and significant nasal congestion as well.    Patient is a 62 y.o. female presenting with chest pain. The history is provided by the patient.  Chest Pain Pain location:  Epigastric Pain quality: sharp and throbbing   Pain radiates to:  Does not radiate Pain radiates to the back: no   Pain severity:  Moderate Onset quality:  Sudden Duration:  15 minutes Timing:  Constant Progression:  Resolved Chronicity:  New Context: not breathing, not eating and not raising an arm   Relieved by:  Nothing Worsened by:  Nothing tried Ineffective treatments:  None tried Associated symptoms: cough, heartburn and shortness of breath   Cough:    Cough characteristics:  Dry   Severity:  Mild   Onset quality:  Gradual Shortness of breath:    Severity:  Moderate   Onset quality:  Gradual   Duration:  2 days   Progression:  Unchanged Risk factors: hypertension   Risk factors: no coronary artery disease and no diabetes mellitus   Risk factors comment:  Asthma   Past Medical History  Diagnosis Date  . Asthma   . Hypertension   . GERD (gastroesophageal reflux disease)   . A-fib     Past Surgical History  Procedure Laterality Date  . Tubal ligation      Family History  Problem Relation Age of Onset  . Cancer Mother   . Cancer Father   . Heart attack Mother 37    History  Substance Use Topics  . Smoking status: Former Smoker    Types:  Cigarettes    Quit date: 11/07/2011  . Smokeless tobacco: Never Used  . Alcohol Use: No    OB History   Grav Para Term Preterm Abortions TAB SAB Ect Mult Living                  Review of Systems  Respiratory: Positive for cough and shortness of breath.   Cardiovascular: Positive for chest pain.  Gastrointestinal: Positive for heartburn.  All other systems reviewed and are negative.    Allergies  Celecoxib  Home Medications   Current Outpatient Rx  Name  Route  Sig  Dispense  Refill  . albuterol (PROVENTIL HFA;VENTOLIN HFA) 108 (90 BASE) MCG/ACT inhaler   Inhalation   Inhale 2 puffs into the lungs every 4 (four) hours as needed for wheezing or shortness of breath.   1 Inhaler   3   . albuterol (PROVENTIL) (2.5 MG/3ML) 0.083% nebulizer solution   Nebulization   Take 3 mLs (2.5 mg total) by nebulization every 6 (six) hours as needed for wheezing.   25 mL   1   . hydrochlorothiazide (HYDRODIURIL) 12.5 MG tablet   Oral   Take 1 tablet (12.5 mg total) by mouth daily.   30 tablet   1   . potassium chloride (K-DUR) 10 MEQ tablet   Oral   Take 2 tablets (20 mEq total) by  mouth 2 (two) times daily.   14 tablet   0   . tiotropium (SPIRIVA HANDIHALER) 18 MCG inhalation capsule   Inhalation   Place 1 capsule (18 mcg total) into inhaler and inhale daily.   30 capsule   12   . pantoprazole (PROTONIX) 40 MG tablet   Oral   Take 1 tablet (40 mg total) by mouth daily.   14 tablet   0   . predniSONE (DELTASONE) 20 MG tablet   Oral   Take 2 tablets (40 mg total) by mouth daily.   12 tablet   0     BP 148/83  Pulse 79  Temp(Src) 98 F (36.7 C) (Oral)  Resp 19  Ht 5\' 7"  (1.702 m)  Wt 130 lb (58.968 kg)  BMI 20.36 kg/m2  SpO2 95%  Physical Exam  Nursing note and vitals reviewed. Constitutional: She is oriented to person, place, and time. She appears well-developed and well-nourished. No distress.  HENT:  Head: Normocephalic and atraumatic.  Nose:  Mucosal edema present.  Mouth/Throat: No oropharyngeal exudate.  Eyes: EOM are normal.  Neck: Normal range of motion and phonation normal. Neck supple. No JVD present. No tracheal deviation present.  Cardiovascular: Normal rate, regular rhythm and intact distal pulses.   No murmur heard. Pulmonary/Chest: No stridor. Tachypnea noted. She has wheezes. She exhibits tenderness.  Abdominal: Soft. She exhibits no distension. There is no tenderness. There is no rebound and no guarding.  Musculoskeletal: She exhibits no edema and no tenderness.  Neurological: She is alert and oriented to person, place, and time. Coordination normal.  Skin: Skin is warm and dry. No rash noted. She is not diaphoretic.  Psychiatric: She has a normal mood and affect.    ED Course  Procedures (including critical care time)  Labs Reviewed  POCT I-STAT, CHEM 8 - Abnormal; Notable for the following:    Potassium 2.7 (*)    Glucose, Bld 123 (*)    Hemoglobin 15.3 (*)    All other components within normal limits  POCT I-STAT TROPONIN I   Dg Chest Port 1 View  01/16/2013   *RADIOLOGY REPORT*  Clinical Data: Shortness of breath  PORTABLE CHEST - 1 VIEW  Comparison: 12/06/2012  Findings: The heart and pulmonary vascularity are within normal limits.  The lungs are again hyperinflated.  No focal infiltrate or sizable effusion is seen.  No bony abnormality is noted.  IMPRESSION: COPD without acute abnormality.   Original Report Authenticated By: Alcide Clever, M.D.     1. Asthma attack   2. Epigastric pain   3. Hypokalemia     ECG at time 0933 shows SR with occasional PVC's at rate 80, normal axis, RSR` in leads V1, V2, likely normal variant, biatrial enlargement, no ST or T wave abn's.  No sig change from ECG on 12/06/12.   RA sat is 96% and I interpret to be adequate   11:03 AM Pt recheckeded during neb treatment, feels improved, CP/epigastric discomfort resolved.  She thinks she would like to go home.  RT reprots  peak flows improved as well.     12:33 PM Pt continues to feel improved, improved wheezing, K+ was 2.7, likely due to significant albuterol use, will give 40 meq PO now, but should normalize on its own.  No renal insuff noted.  Prednisone and protonix given as Rx.  Troponin was normal. MDM  Pt with nasal congestion, asthma exacerbation with chest tightness, tachypnea and exp and mild insp wheezing.  Will give nebs, RT to evaluate.  Pt needs inhaler refill for home.  Pt's upper epigastric pain is resolved, given severe GERD, I suspect pt had esophageal spasm.  ECG shows no acute ischemia.  Doubt cardiac.   Will give GI cocktail, continue to monitor and treat asthma exacerbation.  Low dose steroids given my suspicion for gastric upset at this time.          Gavin Pound. Jkwon Treptow, MD 01/16/13 1234

## 2013-01-16 NOTE — Discharge Instructions (Signed)
 Asthma, Adult Asthma is a condition that affects your lungs. It is characterized by swelling and narrowing of your airways as well as increased mucus production. The narrowing comes from swelling and muscle spasms inside the airways. When this happens, breathing can be difficult and you can have coughing, wheezing, and shortness of breath. Knowing more about asthma can help you manage it better. Asthma cannot be cured, but medicines and lifestyle changes can help control it. Asthma can be a minor problem for some people but if it is not controlled it can lead to a life-threatening asthma attack. Asthma can change over time. It is important to work with your caregiver to manage your asthma symptoms. CAUSES The exact cause of asthma is unknown. Asthma is believed to be caused by inherited (genetic) and environmental exposures. Swelling and redness (inflammation) of the airways occurs in asthma. This can be triggered by allergies, viral lung infections, or irritants in the air. Allergic reactions can cause you to wheeze immediately or several hours after an exposure. Asthma triggers are different for each person. It is important to pay attention and know what triggers your asthma.  Common triggers for asthma attacks include:  Animal dander from the skin, hair, or feathers of animals.  Dust mites contained in house dust.  Cockroaches.  Pollen from trees or grass.  Mold.  Cigarette or tobacco smoke. Smoking cannot be allowed in homes of people with asthma. People with asthma should not smoke and should not be around smokers.  Air pollutants such as dust, household cleaners, hair sprays, aerosol sprays, paint fumes, strong chemicals, or strong odors.  Cold air or weather changes. Cold air may cause inflammation. Winds increase molds and pollens in the air. There is not one best climate for people with asthma.  Strong emotions such as crying or laughing hard.  Stress.  Certain medicines such as  aspirin  or beta-blockers.  Sulfites in such foods and drinks as dried fruits and wine.  Infections or inflammatory conditions such as the flu, a cold, or an inflammation of the nasal membranes (rhinitis).  Gastroesophageal reflux disease (GERD). GERD is a condition where stomach acid backs up into your throat (esophagus).  Exercise or strenous activity. Proper pre-exercise medicines allow most people to participate in sports. SYMPTOMS  Feeling short of breath.  Chest tightness or pain.  Difficulty sleeping due to coughing, wheezing, or feeling short of breath.  A whistling or wheezing sound with exhalation.  Coughing or wheezing that is worse when you:  Have a virus (such as a cold or the flu).  Are suffering from allergies.  Are exposed to certain fumes or chemicals.  Exercise. Signs that your asthma is probably getting worse include:   More frequent and bothersome asthma signs and symptoms.  Increasing difficulty breathing. This can be measured by a peak flow meter, which is a simple device used to check how well your lungs are working.  An increasingly frequent need to use a quick-relief inhaler. DIAGNOSIS  The diagnosis of asthma is made by review of your medical history, a physical exam, and possibly from other tests. Lung function studies may help with the diagnosis. TREATMENT  Asthma cannot be cured. However, for the majority of adults, asthma can be controlled with treatment. Besides avoidance of triggers of your asthma, medicines are often required. There are 2 classes of medicine used for asthma treatment: controller medicines (reduce inflammation and symptoms) andreliever or rescue medicines (relieve asthma symptoms during acute attacks). You may require daily  medicines to control your asthma. The most effective long-term controller medicines for asthma are inhaled corticosteroids (blocks inflammation). Other long-term control medicines include:  Leukotriene  receptor antagonists (blocks a pathway of inflammation).  Long-acting beta2-agonists (relaxes the muscles of the airways for at least 12 hours) with an inhaled corticosteroid.  Cromolyn  sodium or nedocromil (alters certain inflammatory cells' ability to release chemicals that cause inflammation).  Immunomodulators (alters the immune system to prevent asthma symptoms).  Theophylline  (relaxes muscles in the airways). You may also require a short-acting beta2-agonist to relieve asthma symptoms during an acute attack. You should understand what to do during an acute attack. Inhaled medicines are effective when used properly. Read the instructions on how to use your medicines correctly and speak to your caregiver if you have questions. Follow up with your caregiver on a regular basis to make sure your asthma is well-controlled. If your asthma is not well-controlled, if you have been hospitalized for asthma, or if multiple medicines or medium to high doses of inhaled corticosteroids are needed to control your asthma, request a referral to an asthma specialist. HOME CARE INSTRUCTIONS   Take medicines as directed by your caregiver.  Control your home environment in the following ways to help prevent asthma attacks:  Change your heating and air conditioning filter at least once a month.  Place a filter or cheesecloth over your heating and air conditioning vents.  Limit the use of fireplaces and wood stoves.  Do not smoke. Do not stay in places where others are smoking.  Get rid of pests (such as roaches and mice) and their droppings.  If you see mold on a plant, throw it away.  Clean your floors and dust every week. Use unscented cleaning products. Use a vacuum cleaner with a HEPA filter if possible. If vacuuming or cleaning triggers your asthma, try to find someone else to do these chores.  Floors in your house should be wood, tile, or vinyl. Carpet can trap dander and dust.  Use  allergy-proof pillows, mattress covers, and box spring covers.  Wash bedsheets and blankets every week in hot water  and dry in a dryer.  Use a blanket that is made of polyester or cotton with a tight nap.  Do not use a dust ruffle on your bed.  Clean bathrooms and kitchens with bleach and repaint with mold-resistant paint.  Wash hands frequently.  Talk to your caregiver about an action plan for managing asthma attacks. This includes the use of a peak flow meter which measures the severity of the attack and medicines that can help stop the attack. An action plan can help minimize or stop the attack without having to seek medical care.  Remain calm during an asthma attack.  Always have a plan prepared for seeking medical attention. This should include contacting your caregiver and in the case of a severe attack, calling your local emergency services (911 in [place] SEEK MEDICAL CARE IF:   You have wheezing, shortness of breath, or a cough even if taking medicine to prevent attacks.  You have thickening of sputum.  Your sputum changes from clear or white to yellow, green, gray, or bloody.  You have any problems that may be related to the medicines you are taking (such as a rash, itching, swelling, or trouble breathing).  You are using a reliever medicine more than 2 3 times per week.  Your peak flow is still at 50 79% of personal best after following your action plan for 1  hour. SEEK IMMEDIATE MEDICAL CARE IF:   You are short of breath even at rest.  You get short of breath when doing very little physical activity.  You have difficulty eating, drinking, or talking due to asthma symptoms.  You have chest pain or you feel that your heart is beating fast.  You have a bluish color to your lips or fingernails.  You are lightheaded, dizzy, or faint.  You have a fever or persistent symptoms for more than 2 3 days.  You have a fever and symptoms suddenly get worse.  You seem to be  getting worse and are unresponsive to treatment during an asthma attack.  Your peak flow is less than 50% of personal best. MAKE SURE YOU:   Understand these instructions.  Will watch your condition.  Will get help right away if you are not doing well or get worse. Document Released: 08/09/2005 Document Revised: 07/26/2012 Document Reviewed: 03/27/2008 [facility] Patient Information 2014 [place], [place].    Using Your Inhaler Proper inhaler technique is very important. Good technique assures that the medicine reaches the lungs. Poor technique results in depositing the medicine on the tongue and back of the throat rather than in the airways. STEPS TO FOLLOW IF USING INHALER WITHOUT EXTENSION TUBE: 1. Remove cap from inhaler. 2. Shake inhaler for 5 seconds before each inhalation (breathing in). 3. Position the inhaler so that the top of the canister faces up. 4. Put your index finger on the top of the medication canister. Your thumb supports the bottom of the inhaler. 5. Open your mouth. 6. Hold the inhaler 1 to 2 inches away from your open mouth. This allows the medicine to slow down before the medicine enters the mouth. 7. Exhale (breathe out) normally and as completely as possible. 8. Press the canister down with the index finger to release the medication. 9. At the same time as the canister is pressed, inhale deeply and slowly until the lungs are completely filled. This should take 4 to 6 seconds. Keep your tongue down and out of the way. 10. Hold the medication in your lungs for up to 10 seconds (10 seconds is best). This helps the medicine get into the small airways of your lungs to work better. 11. Breathe out slowly, through pursed lips. Whistling is an example of pursed lips. 12. Wait at least 1 minute between puffs. Continue with the above steps until you have taken the number of puffs your caregiver has ordered. 13. Replace cap on inhaler. STEPS TO FOLLOW USING AN INHALER WITH  AN EXTENSION (SPACER): 1.  Remove cap from inhaler. 2. Shake inhaler for 5 seconds before each inhalation (breathing in). 3. Your caregiver has asked you to use a spacer with your inhaler. A spacer is a plastic tube with a mouthpiece on one end and an opening that connects to the inhaler on the other end. A spacer helps you take the medicine better. 4. Place the open end of the spacer onto the mouthpiece of the inhaler. 5. Position the inhaler so that the top of the canister faces up and the spacer mouthpiece faces you. 6. Put your index finger on the top of the medication canister. Your thumb supports the bottom of the inhaler and the spacer. 7. Exhale (breathe out) normally and as completely as possible. 8. Immediately after exhaling, place the spacer between your teeth and into your mouth. Close your mouth tightly around the spacer. 9. Press the canister down with the index finger to  release the medication. 10. At the same time as the canister is pressed, inhale deeply and slowly until the lungs are completely filled. This should take 4 to 6 seconds. Keep your tongue down and out of the way. 11. Hold the medication in your lungs for up to 10 seconds (10 seconds is best). This helps the medicine get into the small airways of your lungs to work better. Exhale. 12. Repeat inhaling deeply through the spacer mouthpiece. Again hold that breath for up to 10 seconds (10 seconds is best). Exhale slowly. If it is difficult to take this second deep breath through the spacer, breathe normally several times through the spacer. Remove the spacer from your mouth. 13. Wait at least 1 minute between puffs. Continue with the above steps until you have taken the number of puffs your caregiver has ordered. 14. Remove spacer from the inhaler and place cap on inhaler. If you are using different kinds of inhalers, use your quick relief medicine to open the airways 10 - 15 minutes before using a steroid. If you are unsure  which inhalers to use and the order of using them, ask your caregiver, nurse, or respiratory therapist. If you are using a steroid inhaler, rinse your mouth with water  after your last puff and then spit out the water . Do not swallow the water . Avoid the following:  Inhaling before or after starting the spray of medicine. It takes practice to coordinate your breathing with triggering the spray.  Inhaling through the nose (rather than the mouth) when triggering the spray. HOW TO DETERMINE IF YOUR INHALER IS FULL OR NEARLY EMPTY:  Determine when an inhaler is empty. You cannot know when an inhaler is empty by shaking it. A few inhalers are now being made with dose counters. Ask your caregiver for a prescription that has a dose counter if you feel you need that extra help.  If your inhaler does not have a counter, check the number of doses in the inhaler before you use it. The canister or box will list the number of doses in the canister. Divide the total number of doses in the canister by the number you will use each day to find how many days the canister will last. (For example, if your canister has 200 doses and you take 2 puffs, 4 times each day, which is 8 puffs a day. Dividing 200 by 8 equals 25. The canister should last 25 days.) Using a calendar, count forward that many days to see when your inhaler will run out. Write the refill date on a calendar or your canister.  Remember, if you need to take extra doses, the inhaler will empty sooner than you figured. Be sure you have a refill before your canister runs out. Refill your inhaler 7 to 10 days before it runs out. HOME CARE INSTRUCTIONS   Do not use the inhaler more than your caregiver tells you. If you are still wheezing and are feeling tightness in your chest, call your caregiver.  Keep an adequate supply of medication. This includes making sure the medicine is not expired, and you have a spare inhaler.  Follow your caregiver or inhaler  insert directions for cleaning the inhaler and spacer. SEEK MEDICAL CARE IF:   Symptoms are only partially relieved with your inhaler.  You are having trouble using your inhaler.  You experience some increase in phlegm.  You develop a fever of 100.5 F (38.1 C). SEEK IMMEDIATE MEDICAL CARE IF:   You  feel little or no relief with your inhalers. You are still wheezing and are feeling shortness of breath and/or tightness in your chest.  You have side effects such as dizziness, headaches or fast heart rate.  You have chills, fever, night sweats or an oral temperature above 102 F (38.9 C).  Phlegm production increases a lot, or there is blood in the phlegm. MAKE SURE YOU:   Understand these instructions.  Will watch your condition.  Will get help right away if you are not doing well or get worse. Document Released: 08/06/2000 Document Revised: 11/01/2011 Document Reviewed: 05/27/2009 [facility] Patient Information 2014 [place], [place].

## 2013-01-23 ENCOUNTER — Encounter (HOSPITAL_COMMUNITY): Payer: Self-pay | Admitting: *Deleted

## 2013-01-23 ENCOUNTER — Emergency Department (HOSPITAL_COMMUNITY)
Admission: EM | Admit: 2013-01-23 | Discharge: 2013-01-23 | Disposition: A | Discharge status: Home / Self Care | Payer: Medicaid Other | Attending: Emergency Medicine | Admitting: Emergency Medicine

## 2013-01-23 ENCOUNTER — Emergency Department (HOSPITAL_COMMUNITY): Payer: Medicaid Other

## 2013-01-23 DIAGNOSIS — Z79899 Other long term (current) drug therapy: Secondary | ICD-10-CM | POA: Insufficient documentation

## 2013-01-23 DIAGNOSIS — R109 Unspecified abdominal pain: Secondary | ICD-10-CM

## 2013-01-23 DIAGNOSIS — I10 Essential (primary) hypertension: Secondary | ICD-10-CM | POA: Insufficient documentation

## 2013-01-23 DIAGNOSIS — Z87891 Personal history of nicotine dependence: Secondary | ICD-10-CM | POA: Insufficient documentation

## 2013-01-23 DIAGNOSIS — R1032 Left lower quadrant pain: Secondary | ICD-10-CM | POA: Insufficient documentation

## 2013-01-23 DIAGNOSIS — J45909 Unspecified asthma, uncomplicated: Secondary | ICD-10-CM | POA: Insufficient documentation

## 2013-01-23 DIAGNOSIS — I4891 Unspecified atrial fibrillation: Secondary | ICD-10-CM | POA: Insufficient documentation

## 2013-01-23 DIAGNOSIS — E876 Hypokalemia: Secondary | ICD-10-CM | POA: Insufficient documentation

## 2013-01-23 DIAGNOSIS — Z8719 Personal history of other diseases of the digestive system: Secondary | ICD-10-CM | POA: Insufficient documentation

## 2013-01-23 LAB — CBC WITH DIFFERENTIAL/PLATELET
Eosinophils Absolute: 0.3 10*3/uL (ref 0.0–0.7)
Eosinophils Relative: 5 % (ref 0–5)
Hemoglobin: 13.9 g/dL (ref 12.0–15.0)
Lymphs Abs: 2.3 10*3/uL (ref 0.7–4.0)
MCH: 27.4 pg (ref 26.0–34.0)
MCV: 78.3 fL (ref 78.0–100.0)
Monocytes Absolute: 0.4 10*3/uL (ref 0.1–1.0)
Monocytes Relative: 7 % (ref 3–12)
RBC: 5.07 MIL/uL (ref 3.87–5.11)

## 2013-01-23 LAB — URINALYSIS, ROUTINE W REFLEX MICROSCOPIC
Ketones, ur: NEGATIVE mg/dL
Leukocytes, UA: NEGATIVE
Nitrite: NEGATIVE
Specific Gravity, Urine: 1.012 (ref 1.005–1.030)
pH: 7 (ref 5.0–8.0)

## 2013-01-23 LAB — COMPREHENSIVE METABOLIC PANEL
Alkaline Phosphatase: 79 U/L (ref 39–117)
BUN: 16 mg/dL (ref 6–23)
Calcium: 9.5 mg/dL (ref 8.4–10.5)
Creatinine, Ser: 0.64 mg/dL (ref 0.50–1.10)
GFR calc Af Amer: >90 mL/min (ref >90)
Glucose, Bld: 107 mg/dL — ABNORMAL HIGH (ref 70–99)
Total Protein: 7.2 g/dL (ref 6.0–8.3)

## 2013-01-23 MED ORDER — OXYCODONE-ACETAMINOPHEN 5-325 MG PO TABS: 2.0000 | ORAL_TABLET | Freq: Four times a day (QID) | ORAL | Status: DC | PRN

## 2013-01-23 MED ORDER — POTASSIUM CHLORIDE 20 MEQ/15ML (10%) PO LIQD
40.0000 meq | Freq: Once | ORAL | Status: AC
  Administered 2013-01-23: 40 meq via ORAL
  Filled 2013-01-23: qty 30

## 2013-01-23 MED ORDER — POTASSIUM CHLORIDE CRYS ER 20 MEQ PO TBCR
40.0000 meq | EXTENDED_RELEASE_TABLET | Freq: Once | ORAL | Status: AC
  Administered 2013-01-23: 40 meq via ORAL
  Filled 2013-01-23: qty 2

## 2013-01-23 MED ORDER — OMEPRAZOLE 20 MG PO CPDR: 20.0000 mg | DELAYED_RELEASE_CAPSULE | Freq: Every day | ORAL | Status: DC

## 2013-01-23 MED ORDER — IOHEXOL 300 MG/ML  SOLN
80.0000 mL | Freq: Once | INTRAMUSCULAR | Status: AC | PRN
  Administered 2013-01-23: 80 mL via INTRAVENOUS

## 2013-01-23 MED ORDER — MORPHINE SULFATE 4 MG/ML IJ SOLN
4.0000 mg | Freq: Once | INTRAMUSCULAR | Status: AC
  Administered 2013-01-23: 4 mg via INTRAVENOUS
  Filled 2013-01-23: qty 1

## 2013-01-23 MED ORDER — IOHEXOL 300 MG/ML  SOLN
25.0000 mL | INTRAMUSCULAR | Status: AC
  Administered 2013-01-23 (×2): 25 mL via ORAL

## 2013-01-23 MED ORDER — POTASSIUM CHLORIDE CRYS ER 20 MEQ PO TBCR: 20.0000 meq | EXTENDED_RELEASE_TABLET | Freq: Two times a day (BID) | ORAL | Status: DC

## 2013-01-23 MED ORDER — SODIUM CHLORIDE 0.9 % IV BOLUS (SEPSIS)
1000.0000 mL | Freq: Once | INTRAVENOUS | Status: AC
  Administered 2013-01-23: 1000 mL via INTRAVENOUS

## 2013-01-23 NOTE — ED Provider Notes (Signed)
History     CSN: [id number]  Arrival date & time 01/23/13  5621   First MD Initiated Contact with Patient 01/23/13 0901      Chief Complaint  Patient presents with  . Abdominal Pain    (Consider location/radiation/quality/duration/timing/severity/associated sxs/prior treatment) HPI Comments: Patient presents to the emergency department with chief complaint of left lower quadrant pain that began 3 days ago. She states the pain as sharp, and is worse at night. She denies fevers, chills, vomiting, and diarrhea. She was seen here recently and treated for GERD. She states that she was recently prescribed Protonix, which is been taking, but this made her feel nauseated. She denies chest pain, shortness of breath, dysuria, vaginal discharge, numbness or tingling of the extremities.  The history is provided by the patient. No language interpreter was used.    Past Medical History  Diagnosis Date  . Asthma   . Hypertension   . GERD (gastroesophageal reflux disease)   . A-fib     Past Surgical History  Procedure Laterality Date  . Tubal ligation      Family History  Problem Relation Age of Onset  . Cancer Mother   . Cancer Father   . Heart attack Mother 56    History  Substance Use Topics  . Smoking status: Former Smoker    Types: Cigarettes    Quit date: 11/07/2011  . Smokeless tobacco: Never Used  . Alcohol Use: No    OB History   Grav Para Term Preterm Abortions TAB SAB Ect Mult Living                  Review of Systems  All other systems reviewed and are negative.    Allergies  Pantoprazole and Celecoxib  Home Medications   Current Outpatient Rx  Name  Route  Sig  Dispense  Refill  . albuterol (PROVENTIL HFA;VENTOLIN HFA) 108 (90 BASE) MCG/ACT inhaler   Inhalation   Inhale 2 puffs into the lungs every 4 (four) hours as needed for wheezing or shortness of breath.   1 Inhaler   3   . albuterol (PROVENTIL) (2.5 MG/3ML) 0.083% nebulizer solution    Nebulization   Take 3 mLs (2.5 mg total) by nebulization every 6 (six) hours as needed for wheezing.   25 mL   1   . hydrochlorothiazide (HYDRODIURIL) 12.5 MG tablet   Oral   Take 1 tablet (12.5 mg total) by mouth daily.   30 tablet   1   . potassium chloride (K-DUR) 10 MEQ tablet   Oral   Take 2 tablets (20 mEq total) by mouth 2 (two) times daily.   14 tablet   0   . predniSONE (DELTASONE) 20 MG tablet   Oral   Take 2 tablets (40 mg total) by mouth daily.   12 tablet   0   . tiotropium (SPIRIVA HANDIHALER) 18 MCG inhalation capsule   Inhalation   Place 1 capsule (18 mcg total) into inhaler and inhale daily.   30 capsule   12     BP 156/92  Pulse 74  Temp(Src) 98.2 F (36.8 C) (Oral)  Resp 18  SpO2 98%  Physical Exam  Nursing note and vitals reviewed. Constitutional: She is oriented to person, place, and time. She appears well-developed and well-nourished.  HENT:  Head: Normocephalic and atraumatic.  Eyes: Conjunctivae and EOM are normal.  Neck: Normal range of motion. Neck supple.  Cardiovascular: Normal rate and regular rhythm.  Exam  reveals no gallop and no friction rub.   No murmur heard. Pulmonary/Chest: Effort normal and breath sounds normal. No respiratory distress. She has no wheezes. She has no rales. She exhibits no tenderness.  Abdominal: Soft. Bowel sounds are normal. She exhibits no distension and no mass. There is tenderness. There is no rebound and no guarding.  Left lower quadrant tender to palpation, no fluid wave, no signs of peritonitis, no other abdominal tenderness  Musculoskeletal: Normal range of motion. She exhibits no edema and no tenderness.  Neurological: She is alert and oriented to person, place, and time.  Skin: Skin is warm and dry.  Psychiatric: She has a normal mood and affect. Her behavior is normal. Judgment and thought content normal.    ED Course  Procedures (including critical care time)  Labs Reviewed  URINALYSIS,  ROUTINE W REFLEX MICROSCOPIC  CBC WITH DIFFERENTIAL  COMPREHENSIVE METABOLIC PANEL   Results for orders placed during the hospital encounter of 01/23/13  CBC WITH DIFFERENTIAL      Result Value Range   WBC 5.3  4.0 - 10.5 K/uL   RBC 5.07  3.87 - 5.11 MIL/uL   Hemoglobin 13.9  12.0 - 15.0 g/dL   HCT 16.1  09.6 - 04.5 %   MCV 78.3  78.0 - 100.0 fL   MCH 27.4  26.0 - 34.0 pg   MCHC 35.0  30.0 - 36.0 g/dL   RDW 40.9  81.1 - 91.4 %   Platelets 208  150 - 400 K/uL   Neutrophils Relative % 44  43 - 77 %   Neutro Abs 2.3  1.7 - 7.7 K/uL   Lymphocytes Relative 44  12 - 46 %   Lymphs Abs 2.3  0.7 - 4.0 K/uL   Monocytes Relative 7  3 - 12 %   Monocytes Absolute 0.4  0.1 - 1.0 K/uL   Eosinophils Relative 5  0 - 5 %   Eosinophils Absolute 0.3  0.0 - 0.7 K/uL   Basophils Relative 0  0 - 1 %   Basophils Absolute 0.0  0.0 - 0.1 K/uL  COMPREHENSIVE METABOLIC PANEL      Result Value Range   Sodium 136  135 - 145 mEq/L   Potassium 2.9 (*) 3.5 - 5.1 mEq/L   Chloride 99  96 - 112 mEq/L   CO2 27  19 - 32 mEq/L   Glucose, Bld 107 (*) 70 - 99 mg/dL   BUN 16  6 - 23 mg/dL   Creatinine, Ser 7.82  0.50 - 1.10 mg/dL   Calcium 9.5  8.4 - 95.6 mg/dL   Total Protein 7.2  6.0 - 8.3 g/dL   Albumin 3.9  3.5 - 5.2 g/dL   AST 20  0 - 37 U/L   ALT 25  0 - 35 U/L   Alkaline Phosphatase 79  39 - 117 U/L   Total Bilirubin 0.3  0.3 - 1.2 mg/dL   GFR calc non Af Amer >90  >90 mL/min   GFR calc Af Amer >90  >90 mL/min  URINALYSIS, ROUTINE W REFLEX MICROSCOPIC      Result Value Range   Color, Urine YELLOW  YELLOW   APPearance CLEAR  CLEAR   Specific Gravity, Urine 1.012  1.005 - 1.030   pH 7.0  5.0 - 8.0   Glucose, UA NEGATIVE  NEGATIVE mg/dL   Hgb urine dipstick NEGATIVE  NEGATIVE   Bilirubin Urine NEGATIVE  NEGATIVE   Ketones, ur NEGATIVE  NEGATIVE  mg/dL   Protein, ur NEGATIVE  NEGATIVE mg/dL   Urobilinogen, UA 0.2  0.0 - 1.0 mg/dL   Nitrite NEGATIVE  NEGATIVE   Leukocytes, UA NEGATIVE  NEGATIVE    Ct Abdomen Pelvis W Contrast  01/23/2013   *RADIOLOGY REPORT*  Clinical Data: Left lower quadrant pain.  Nausea.  CT ABDOMEN AND PELVIS WITH CONTRAST  Technique:  Multidetector CT imaging of the abdomen and pelvis was performed following the standard protocol during bolus administration of intravenous contrast.  Contrast: 80mL OMNIPAQUE IOHEXOL 300 MG/ML  SOLN  Comparison: CT scan dated 02/02/2007  Findings: There are multiple small stable benign liver cysts. Liver parenchyma is otherwise normal.  There is chronic dilatation of the common bile duct and pancreatic duct, unchanged.  Pancreas is otherwise normal.  Biliary tree is otherwise normal.  Spleen is normal.  Both adrenal glands are slightly prominent consistent with a benign adrenal hyperplasia.  No mass lesions.  There is a tiny stone in the lower pole of the right kidney.  Left kidney is normal.  The left ureter is slightly prominent throughout its length but there is no ureteral stone.  Bladder appears normal.  The bowel appears normal including the appendix and terminal ileum. No free air or free fluid.  Uterus is deviated to the left. Ovaries are normal.  No acute osseous abnormality.  IMPRESSION:  No acute abnormalities of the abdomen or pelvis.   Original Report Authenticated By: Francene Boyers, M.D.   Dg Chest Port 1 View  01/16/2013   *RADIOLOGY REPORT*  Clinical Data: Shortness of breath  PORTABLE CHEST - 1 VIEW  Comparison: 12/06/2012  Findings: The heart and pulmonary vascularity are within normal limits.  The lungs are again hyperinflated.  No focal infiltrate or sizable effusion is seen.  No bony abnormality is noted.  IMPRESSION: COPD without acute abnormality.   Original Report Authenticated By: Alcide Clever, M.D.      1. Abdominal  pain, other specified site   2. Hypokalemia       MDM  Patient with left lower quadrant tenderness x3 days. No fever, no vomiting, or diarrhea. Will order basic labs, urinalysis, and CT abdomen  pelvis to rule out diverticulitis.  Labs show hypokalemia.  I am supplementing K.   CT abdomen is negative for diverticulitis. Patient states that she is feeling better after having received fluids. I'm going to discharge her to home with GI followup. Will switch her back to omeprazole, which she says "works great."  Workup, treatment and discharge plan discussed with Dr. Bernette Mayers, who agrees with the plan. Patient is stable and ready for discharge.        Roxy Horseman, PA-C 01/23/13 1217

## 2013-01-23 NOTE — ED Notes (Signed)
Pt states that she is here with LLQ abdominal sharp pains.  Pt was treated for reflux with protonix and made her feel nauseated/dizziness.  Pt stopped taking it

## 2013-01-23 NOTE — ED Provider Notes (Signed)
Medical screening examination/treatment/procedure(s) were performed by non-physician practitioner and as supervising physician I was immediately available for consultation/collaboration.   Charles B. Bernette Mayers, MD 01/23/13 1228

## 2013-02-15 ENCOUNTER — Emergency Department (HOSPITAL_COMMUNITY)
Admission: EM | Admit: 2013-02-15 | Discharge: 2013-02-15 | Disposition: A | Discharge status: Home / Self Care | Payer: Medicaid Other | Attending: Emergency Medicine | Admitting: Emergency Medicine

## 2013-02-15 ENCOUNTER — Encounter (HOSPITAL_COMMUNITY): Payer: Self-pay | Admitting: *Deleted

## 2013-02-15 ENCOUNTER — Emergency Department (HOSPITAL_COMMUNITY): Payer: Medicaid Other

## 2013-02-15 DIAGNOSIS — R05 Cough: Secondary | ICD-10-CM | POA: Insufficient documentation

## 2013-02-15 DIAGNOSIS — R059 Cough, unspecified: Secondary | ICD-10-CM | POA: Insufficient documentation

## 2013-02-15 DIAGNOSIS — J45901 Unspecified asthma with (acute) exacerbation: Secondary | ICD-10-CM | POA: Insufficient documentation

## 2013-02-15 DIAGNOSIS — J3489 Other specified disorders of nose and nasal sinuses: Secondary | ICD-10-CM | POA: Insufficient documentation

## 2013-02-15 DIAGNOSIS — I10 Essential (primary) hypertension: Secondary | ICD-10-CM

## 2013-02-15 DIAGNOSIS — R51 Headache: Secondary | ICD-10-CM | POA: Insufficient documentation

## 2013-02-15 DIAGNOSIS — Z87891 Personal history of nicotine dependence: Secondary | ICD-10-CM | POA: Insufficient documentation

## 2013-02-15 DIAGNOSIS — Z8679 Personal history of other diseases of the circulatory system: Secondary | ICD-10-CM | POA: Insufficient documentation

## 2013-02-15 DIAGNOSIS — J069 Acute upper respiratory infection, unspecified: Secondary | ICD-10-CM

## 2013-02-15 DIAGNOSIS — R0789 Other chest pain: Secondary | ICD-10-CM | POA: Insufficient documentation

## 2013-02-15 DIAGNOSIS — K219 Gastro-esophageal reflux disease without esophagitis: Secondary | ICD-10-CM | POA: Insufficient documentation

## 2013-02-15 DIAGNOSIS — J441 Chronic obstructive pulmonary disease with (acute) exacerbation: Secondary | ICD-10-CM

## 2013-02-15 MED ORDER — FLUTICASONE PROPIONATE 50 MCG/ACT NA SUSP: 2.0000 | Freq: Every day | NASAL | Status: DC

## 2013-02-15 MED ORDER — ALBUTEROL SULFATE HFA 108 (90 BASE) MCG/ACT IN AERS
2.0000 | INHALATION_SPRAY | RESPIRATORY_TRACT | Status: DC | PRN
  Filled 2013-02-15: qty 6.7

## 2013-02-15 MED ORDER — ALBUTEROL SULFATE (5 MG/ML) 0.5% IN NEBU
5.0000 mg | INHALATION_SOLUTION | Freq: Once | RESPIRATORY_TRACT | Status: AC
Start: 2013-02-15 — End: 2013-02-15
  Administered 2013-02-15: 5 mg via RESPIRATORY_TRACT
  Filled 2013-02-15: qty 1

## 2013-02-15 MED ORDER — TIOTROPIUM BROMIDE MONOHYDRATE 18 MCG IN CAPS: 18.0000 ug | ORAL_CAPSULE | Freq: Every day | RESPIRATORY_TRACT | Status: DC

## 2013-02-15 MED ORDER — IPRATROPIUM BROMIDE 0.02 % IN SOLN
0.5000 mg | Freq: Once | RESPIRATORY_TRACT | Status: AC
  Administered 2013-02-15: 0.5 mg via RESPIRATORY_TRACT
  Filled 2013-02-15: qty 2.5

## 2013-02-15 MED ORDER — HYDROCODONE-ACETAMINOPHEN 5-325 MG PO TABS
1.0000 | ORAL_TABLET | Freq: Once | ORAL | Status: AC
  Administered 2013-02-15: 1 via ORAL
  Filled 2013-02-15: qty 1

## 2013-02-15 MED ORDER — GUAIFENESIN ER 600 MG PO TB12: 1200.0000 mg | ORAL_TABLET | Freq: Two times a day (BID) | ORAL | Status: DC

## 2013-02-15 MED ORDER — ALBUTEROL SULFATE (5 MG/ML) 0.5% IN NEBU: 2.5000 mg | INHALATION_SOLUTION | RESPIRATORY_TRACT | Status: DC | PRN

## 2013-02-15 MED ORDER — HYDROCODONE-ACETAMINOPHEN 5-325 MG PO TABS: 2.0000 | ORAL_TABLET | Freq: Once | ORAL | Status: DC

## 2013-02-15 MED ORDER — ALBUTEROL SULFATE (5 MG/ML) 0.5% IN NEBU
5.0000 mg | INHALATION_SOLUTION | Freq: Once | RESPIRATORY_TRACT | Status: AC
  Administered 2013-02-15: 5 mg via RESPIRATORY_TRACT
  Filled 2013-02-15: qty 1

## 2013-02-15 MED ORDER — PREDNISONE 20 MG PO TABS
60.0000 mg | ORAL_TABLET | Freq: Once | ORAL | Status: AC
  Administered 2013-02-15: 60 mg via ORAL
  Filled 2013-02-15: qty 3

## 2013-02-15 NOTE — ED Provider Notes (Signed)
History    This chart was scribed for non-physician practitioner Dierdre Forth, PA working with Gilda Crease, * by Quintella Reichert, ED Scribe. This patient was seen in room TR10C/TR10C and the patient's care was started at 7:00 PM .   CSN: [id number]  Arrival date & time 02/15/13  1530    Chief Complaint  Patient presents with  . Asthma    The history is provided by the patient. No language interpreter was used.    HPI Comments: [name] is a 62 y.o. female with h/o asthma and HTN who presents to the Emergency Department complaining of SOB that began this afternoon.  Pt notes that her BP felt high this morning (she did not measure it).  Later developed SOB that was not relieved by albuterol inhaler or home nebulizer treatment.  She was given another albuterol nebulizer treatment on admission which made her feel much better.  Pt states her breathing is back to normal since then but presently she reports headache, cough and congestion.  She notes that she has seasonal allergies.  She also reports experiencing chest tightness and mild pain earlier at home that she attributes to excessive use of her breathing treatments.  Chest tightness and discomfort has now subsided.    Pt does not have a PCP and states that next week she will try to find one who will accept her Medicaid   Past Medical History  Diagnosis Date  . Asthma   . Hypertension   . GERD (gastroesophageal reflux disease)   . A-fib     Past Surgical History  Procedure Laterality Date  . Tubal ligation      Family History  Problem Relation Age of Onset  . Cancer Mother   . Cancer Father   . Heart attack Mother 101    History  Substance Use Topics  . Smoking status: Former Smoker    Types: Cigarettes    Quit date: 11/07/2011  . Smokeless tobacco: Never Used  . Alcohol Use: No    OB History   Grav Para Term Preterm Abortions TAB SAB Ect Mult Living                 Review of Systems   Constitutional: Negative for fever, diaphoresis, appetite change, fatigue and unexpected weight change.  HENT: Positive for congestion. Negative for mouth sores and neck stiffness.   Eyes: Negative for visual disturbance.  Respiratory: Positive for cough and shortness of breath. Negative for chest tightness and wheezing.   Gastrointestinal: Negative for nausea, vomiting, diarrhea and constipation.  Endocrine: Negative for polydipsia, polyphagia and polyuria.  Genitourinary: Negative for dysuria, urgency, frequency and hematuria.  Musculoskeletal: Negative for back pain.  Skin: Negative for rash.  Allergic/Immunologic: Negative for immunocompromised state.  Neurological: Positive for headaches. Negative for syncope and light-headedness.  Hematological: Does not bruise/bleed easily.  Psychiatric/Behavioral: Negative for sleep disturbance. The patient is not nervous/anxious.     Allergies  Pantoprazole and Celecoxib  Home Medications   Current Outpatient Rx  Name  Route  Sig  Dispense  Refill  . albuterol (PROVENTIL HFA;VENTOLIN HFA) 108 (90 BASE) MCG/ACT inhaler   Inhalation   Inhale 2 puffs into the lungs every 4 (four) hours as needed for wheezing or shortness of breath.   1 Inhaler   3   . albuterol (PROVENTIL) (2.5 MG/3ML) 0.083% nebulizer solution   Nebulization   Take 3 mLs (2.5 mg total) by nebulization every 6 (six) hours as needed for wheezing.  25 mL   1   . hydrochlorothiazide (HYDRODIURIL) 12.5 MG tablet   Oral   Take 1 tablet (12.5 mg total) by mouth daily.   30 tablet   1   . omeprazole (PRILOSEC) 20 MG capsule   Oral   Take 1 capsule (20 mg total) by mouth daily.   30 capsule   0   . potassium chloride SA (K-DUR,KLOR-CON) 20 MEQ tablet   Oral   Take 1 tablet (20 mEq total) by mouth 2 (two) times daily.   14 tablet   0   . albuterol (PROVENTIL) (5 MG/ML) 0.5% nebulizer solution   Nebulization   Take 0.5 mLs (2.5 mg total) by nebulization every 4  (four) hours as needed for wheezing or shortness of breath.   20 mL   12   . fluticasone (FLONASE) 50 MCG/ACT nasal spray   Nasal   Place 2 sprays into the nose daily.   16 g   0   . guaiFENesin (MUCINEX) 600 MG 12 hr tablet   Oral   Take 2 tablets (1,200 mg total) by mouth 2 (two) times daily.   30 tablet   0   . tiotropium (SPIRIVA HANDIHALER) 18 MCG inhalation capsule   Inhalation   Place 1 capsule (18 mcg total) into inhaler and inhale daily.   30 capsule   0    BP 188/100  Pulse 82  Temp(Src) 98.3 F (36.8 C) (Oral)  Resp 24  SpO2 96%  Physical Exam  Nursing note and vitals reviewed. Constitutional: She is oriented to person, place, and time. She appears well-developed and well-nourished. No distress.  HENT:  Head: Normocephalic and atraumatic.  Right Ear: Tympanic membrane, external ear and ear canal normal.  Left Ear: Tympanic membrane, external ear and ear canal normal.  Nose: Mucosal edema and rhinorrhea present. No epistaxis. Right sinus exhibits no maxillary sinus tenderness and no frontal sinus tenderness. Left sinus exhibits no maxillary sinus tenderness and no frontal sinus tenderness.  Mouth/Throat: Uvula is midline, oropharynx is clear and moist and mucous membranes are normal. Mucous membranes are not pale and not cyanotic. No edematous. No oropharyngeal exudate, posterior oropharyngeal edema, posterior oropharyngeal erythema or tonsillar abscesses.  Eyes: Conjunctivae and EOM are normal. Pupils are equal, round, and reactive to light. No scleral icterus.  Neck: Normal range of motion and full passive range of motion without pain. Neck supple.  Cardiovascular: Normal rate, regular rhythm, normal heart sounds and intact distal pulses.   No murmur heard. Pulmonary/Chest: Effort normal. No accessory muscle usage or stridor. Not tachypneic. No respiratory distress. She has decreased breath sounds. She has no wheezes. She has rhonchi. She has no rales. She  exhibits no tenderness and no bony tenderness.  Rhonchi throughout Breath sounds diminished  Abdominal: Soft. Bowel sounds are normal. She exhibits no mass. There is no tenderness. There is no rebound and no guarding.  Musculoskeletal: Normal range of motion. She exhibits no edema.  Lymphadenopathy:    She has no cervical adenopathy.  Neurological: She is alert and oriented to person, place, and time. She exhibits normal muscle tone. Coordination normal.  Speech is clear and goal oriented Moves extremities without ataxia  Skin: Skin is warm and dry. No rash noted. She is not diaphoretic. No erythema.  Psychiatric: She has a normal mood and affect. Her behavior is normal.    ED Course  Procedures (including critical care time)  DIAGNOSTIC STUDIES: Oxygen Saturation is 96% on room air, normal  by my interpretation.    COORDINATION OF CARE: 7:05 PM-Discussed treatment plan which includes CXR, albuterol refill, prednisone and pain medication with pt at bedside and pt agreed to plan.      Labs Reviewed - No data to display  Dg Chest 2 View  02/15/2013   *RADIOLOGY REPORT*  Clinical Data: Wheezing, cough and congestion  CHEST - 2 VIEW  Comparison: 01/16/2013  Findings: The lungs hyperexpanded consistent with COPD.  The lungs are clear.  No pleural effusion or pneumothorax.  The heart, mediastinum and hila are unremarkable.  The bony thorax is intact.  IMPRESSION: No acute cardiopulmonary disease.  COPD.   Original Report Authenticated By: Amie Portland, M.D.    1. Asthma exacerbation in COPD   2. Viral URI with cough   3. HYPERTENSION      MDM  Dion Body presents with URI and asthma exacerbation.  Pt CXR negative for acute infiltrate. I personally reviewed the imaging tests through PACS system.  I reviewed available ER/hospitalization records through the EMR.  Patients symptoms are consistent with URI, likely viral etiology causing her asthma exacerbation. Discussed that antibiotics  are not indicated for viral infections. Patient ambulated in ED with O2 saturations maintained >90, no current signs of respiratory distress. Lung exam improved more after 2nd nebulizer treatment. Prednisone given in the ED and pt will be d/c with 5 day burst. Pt states they are breathing at baseline. Pt's BP had decreased spontaneously while here in the ED.  No concern for ACS or Hypertensive urgency.  Pt has been instructed to continue using prescribed medications and to speak with PCP about today's exacerbation.  Pt will also be discharged with symptomatic treatment for URI symptoms.  Verbalizes understanding and is agreeable with plan. Pt is hemodynamically stable & in NAD prior to dc.  I have also discussed reasons to return immediately to the ER.  Patient expresses understanding and agrees with plan.  I personally performed the services described in this documentation, which was scribed in my presence. The recorded information has been reviewed and is accurate.    Dahlia Client Laisa Larrick, PA-C 02/15/13 2002

## 2013-02-15 NOTE — ED Notes (Signed)
Pt states she is having "trouble with my asthma". States she did her breathing tx but still didn't feel good. Also c/o sinus pain and pressure.

## 2013-02-15 NOTE — ED Notes (Signed)
Reports having sob, hx of asthma and no relief with inhaler pta. Airway is intact, no resp distress noted at triage, spo2 96%.

## 2013-02-19 NOTE — ED Provider Notes (Signed)
Medical screening examination/treatment/procedure(s) were performed by non-physician practitioner and as supervising physician I was immediately available for consultation/collaboration.    Zeno Hickel J. Yula Crotwell, MD 02/19/13 1538 

## 2013-02-26 ENCOUNTER — Emergency Department (HOSPITAL_COMMUNITY)
Admission: EM | Admit: 2013-02-26 | Discharge: 2013-02-26 | Disposition: A | Discharge status: Home / Self Care | Payer: Medicaid Other | Attending: Emergency Medicine | Admitting: Emergency Medicine

## 2013-02-26 ENCOUNTER — Encounter (HOSPITAL_COMMUNITY): Payer: Self-pay | Admitting: *Deleted

## 2013-02-26 DIAGNOSIS — R0602 Shortness of breath: Secondary | ICD-10-CM | POA: Insufficient documentation

## 2013-02-26 DIAGNOSIS — I10 Essential (primary) hypertension: Secondary | ICD-10-CM | POA: Insufficient documentation

## 2013-02-26 DIAGNOSIS — I4891 Unspecified atrial fibrillation: Secondary | ICD-10-CM | POA: Insufficient documentation

## 2013-02-26 DIAGNOSIS — Z888 Allergy status to other drugs, medicaments and biological substances status: Secondary | ICD-10-CM | POA: Insufficient documentation

## 2013-02-26 DIAGNOSIS — R0789 Other chest pain: Secondary | ICD-10-CM | POA: Insufficient documentation

## 2013-02-26 DIAGNOSIS — Z79899 Other long term (current) drug therapy: Secondary | ICD-10-CM | POA: Insufficient documentation

## 2013-02-26 DIAGNOSIS — Z87891 Personal history of nicotine dependence: Secondary | ICD-10-CM | POA: Insufficient documentation

## 2013-02-26 DIAGNOSIS — K219 Gastro-esophageal reflux disease without esophagitis: Secondary | ICD-10-CM | POA: Insufficient documentation

## 2013-02-26 DIAGNOSIS — J45909 Unspecified asthma, uncomplicated: Secondary | ICD-10-CM | POA: Insufficient documentation

## 2013-02-26 DIAGNOSIS — J40 Bronchitis, not specified as acute or chronic: Secondary | ICD-10-CM

## 2013-02-26 MED ORDER — IPRATROPIUM BROMIDE 0.02 % IN SOLN
0.5000 mg | Freq: Once | RESPIRATORY_TRACT | Status: AC
  Administered 2013-02-26: 0.5 mg via RESPIRATORY_TRACT
  Filled 2013-02-26: qty 2.5

## 2013-02-26 MED ORDER — PREDNISONE 20 MG PO TABS: 40.0000 mg | ORAL_TABLET | Freq: Every day | ORAL | Status: DC

## 2013-02-26 MED ORDER — ALBUTEROL SULFATE (5 MG/ML) 0.5% IN NEBU
5.0000 mg | INHALATION_SOLUTION | Freq: Once | RESPIRATORY_TRACT | Status: AC
  Administered 2013-02-26: 5 mg via RESPIRATORY_TRACT

## 2013-02-26 MED ORDER — ALBUTEROL SULFATE (5 MG/ML) 0.5% IN NEBU
5.0000 mg | INHALATION_SOLUTION | Freq: Once | RESPIRATORY_TRACT | Status: AC
  Administered 2013-02-26: 5 mg via RESPIRATORY_TRACT
  Filled 2013-02-26: qty 1

## 2013-02-26 MED ORDER — PREDNISONE 20 MG PO TABS
60.0000 mg | ORAL_TABLET | Freq: Once | ORAL | Status: AC
  Administered 2013-02-26: 60 mg via ORAL
  Filled 2013-02-26: qty 3

## 2013-02-26 MED ORDER — ALBUTEROL SULFATE (5 MG/ML) 0.5% IN NEBU
INHALATION_SOLUTION | RESPIRATORY_TRACT | Status: AC
  Filled 2013-02-26: qty 1

## 2013-02-26 NOTE — Discharge Instructions (Signed)
Bronchitis  Bronchitis is the body's way of reacting to injury and/or infection (inflammation) of the bronchi. Bronchi are the air tubes that extend from the windpipe into the lungs. If the inflammation becomes severe, it may cause shortness of breath.  CAUSES   Inflammation may be caused by:  ? A virus.  ? Germs (bacteria).  ? Dust.  ? Allergens.  ? Pollutants and many other irritants.  The cells lining the bronchial tree are covered with tiny hairs (cilia). These constantly beat upward, away from the lungs, toward the mouth. This keeps the lungs free of pollutants. When these cells become too irritated and are unable to do their job, mucus begins to develop. This causes the characteristic cough of bronchitis. The cough clears the lungs when the cilia are unable to do their job. Without either of these protective mechanisms, the mucus would settle in the lungs. Then you would develop pneumonia.  Smoking is a common cause of bronchitis and can contribute to pneumonia. Stopping this habit is the single most important thing you can do to help yourself.  TREATMENT   ? Your caregiver may prescribe an antibiotic if the cough is caused by bacteria. Also, medicines that open up your airways make it easier to breathe. Your caregiver may also recommend or prescribe an expectorant. It will loosen the mucus to be coughed up. Only take over-the-counter or prescription medicines for pain, discomfort, or fever as directed by your caregiver.  ? Removing whatever causes the problem (smoking, for example) is critical to preventing the problem from getting worse.  ? Cough suppressants may be prescribed for relief of cough symptoms.  ? Inhaled medicines may be prescribed to help with symptoms now and to help prevent problems from returning.  ? For those with recurrent (chronic) bronchitis, there may be a need for steroid medicines.  SEEK IMMEDIATE MEDICAL CARE IF:   ? During treatment, you develop more pus-like mucus (purulent  sputum).  ? You have a fever.  ? Your baby is older than 3 months with a rectal temperature of 102? F (38.9? C) or higher.  ? Your baby is 64 months old or younger with a rectal temperature of 100.4? F (38? C) or higher.  ? You become progressively more ill.  ? You have increased difficulty breathing, wheezing, or shortness of breath.  It is necessary to seek immediate medical care if you are elderly or sick from any other disease.  MAKE SURE YOU:   ? Understand these instructions.  ? Will watch your condition.  ? Will get help right away if you are not doing well or get worse.  Document Released: 08/09/2005 Document Revised: 11/01/2011 Document Reviewed: 06/18/2008  ExitCare? Patient Information ?2014 [place], [place].    Antibiotic Nonuse   Your caregiver felt that the infection or problem was not one that would be helped with an antibiotic.  Infections may be caused by viruses or bacteria. Only a caregiver can tell which one of these is the likely cause of an illness. A cold is the most common cause of infection in both adults and children. A cold is a virus. Antibiotic treatment will have no effect on a viral infection. Viruses can lead to many lost days of work caring for sick children and many missed days of school. Children may catch as many as 10 colds or flus per year during which they can be tearful, cranky, and uncomfortable. The goal of treating a virus is aimed at keeping the ill  person comfortable.  Antibiotics are medications used to help the body fight bacterial infections. There are relatively few types of bacteria that cause infections but there are hundreds of viruses. While both viruses and bacteria cause infection they are very different types of germs. A viral infection will typically go away by itself within 7 to 10 days. Bacterial infections may spread or get worse without antibiotic treatment.  Examples of bacterial infections are:  ? Sore throats (like strep throat or  tonsillitis).  ? Infection in the lung (pneumonia).  ? Ear and skin infections.  Examples of viral infections are:  ? Colds or flus.  ? Most coughs and bronchitis.  ? Sore throats not caused by Strep.  ? Runny noses.  It is often best not to take an antibiotic when a viral infection is the cause of the problem. Antibiotics can kill off the helpful bacteria that we have inside our body and allow harmful bacteria to start growing. Antibiotics can cause side effects such as allergies, nausea, and diarrhea without helping to improve the symptoms of the viral infection. Additionally, repeated uses of antibiotics can cause bacteria inside of our body to become resistant. That resistance can be passed onto harmful bacterial. The next time you have an infection it may be harder to treat if antibiotics are used when they are not needed. Not treating with antibiotics allows our own immune system to develop and take care of infections more efficiently. Also, antibiotics will work better for [name] when they are prescribed for bacterial infections.  Treatments for a child that is ill may include:  ? Give extra fluids throughout the day to stay hydrated.  ? Get plenty of rest.  ? Only give your child over-the-counter or prescription medicines for pain, discomfort, or fever as directed by your caregiver.  ? The use of a cool mist humidifier may help stuffy noses.  ? Cold medications if suggested by your caregiver.  Your caregiver may decide to start you on an antibiotic if:  ? The problem you were seen for today continues for a longer length of time than expected.  ? You develop a secondary bacterial infection.  SEEK MEDICAL CARE IF:  ? Fever lasts longer than 5 days.  ? Symptoms continue to get worse after 5 to 7 days or become severe.  ? Difficulty in breathing develops.  ? Signs of dehydration develop (poor drinking, rare urinating, dark colored urine).  ? Changes in behavior or worsening tiredness (listlessness or  lethargy).  Document Released: 10/18/2001 Document Revised: 11/01/2011 Document Reviewed: 04/16/2009  ExitCare? Patient Information ?2014 [place], [place].

## 2013-02-26 NOTE — ED Notes (Signed)
Pt discharged home. Encouraged to follow up with PCP.

## 2013-02-26 NOTE — ED Notes (Signed)
Respiratory notified of adult wheeze protocol.

## 2013-02-26 NOTE — ED Notes (Signed)
Pt is here with wheezing and states her asthma is acting up.  PT concerned about elevated bp

## 2013-02-26 NOTE — Progress Notes (Signed)
RT Note: RT called to assess pt for respiratory status. BBS clear, HR 72, SPO2 100% on RA, RR 18. Pt denies SOB at this time & states she does not need another treatment.

## 2013-02-26 NOTE — ED Provider Notes (Signed)
History    CSN: [id number] Arrival date & time 02/26/13  1022  First MD Initiated Contact with Patient 02/26/13 1111     Chief Complaint  Patient presents with  . Wheezing   (Consider location/radiation/quality/duration/timing/severity/associated sxs/prior Treatment) HPI Comments: Pt quit smoking years ago.  Pt does use rescue inhaler frequently, sometimes even when she doesn't need to to try to prevent coming to the ED.  No fevers, chills.  Was last seen about 1 week ago.  Reports no prednisone was given last time. She denies having a preventative inhaler, but in review of MAR, she was given spiriva last time as well.    Patient is a 62 y.o. female presenting with wheezing. The history is provided by the patient and a friend.  Wheezing Severity:  Moderate Severity compared to prior episodes:  Similar Onset quality:  Sudden Duration:  12 hours Timing:  Constant Progression:  Unchanged Chronicity:  Recurrent Context: not animal exposure, not dust, not emotional upset, not exercise, not exposure to allergen, not fumes and not smoke exposure   Relieved by:  Home nebulizer Worsened by:  Nothing tried Associated symptoms: chest tightness, cough and shortness of breath   Associated symptoms: no fever, no rhinorrhea and no sore throat   Risk factors: prior hospitalizations   Risk factors: no prior ICU admissions and no prior intubations    Past Medical History  Diagnosis Date  . Asthma   . Hypertension   . GERD (gastroesophageal reflux disease)   . A-fib    Past Surgical History  Procedure Laterality Date  . Tubal ligation     Family History  Problem Relation Age of Onset  . Cancer Mother   . Cancer Father   . Heart attack Mother 22   History  Substance Use Topics  . Smoking status: Former Smoker    Types: Cigarettes    Quit date: 11/07/2011  . Smokeless tobacco: Never Used  . Alcohol Use: No   OB History   Grav Para Term Preterm Abortions TAB SAB Ect Mult Living                 Review of Systems  Constitutional: Negative for fever.  HENT: Negative for sore throat and rhinorrhea.   Respiratory: Positive for cough, chest tightness, shortness of breath and wheezing.   All other systems reviewed and are negative.    Allergies  Pantoprazole and Celecoxib  Home Medications   Current Outpatient Rx  Name  Route  Sig  Dispense  Refill  . albuterol (PROVENTIL HFA;VENTOLIN HFA) 108 (90 BASE) MCG/ACT inhaler   Inhalation   Inhale 2 puffs into the lungs every 4 (four) hours as needed for wheezing or shortness of breath.   1 Inhaler   3   . albuterol (PROVENTIL) (5 MG/ML) 0.5% nebulizer solution   Nebulization   Take 0.5 mLs (2.5 mg total) by nebulization every 4 (four) hours as needed for wheezing or shortness of breath.   20 mL   12   . hydrochlorothiazide (HYDRODIURIL) 12.5 MG tablet   Oral   Take 1 tablet (12.5 mg total) by mouth daily.   30 tablet   1   . potassium chloride SA (K-DUR,KLOR-CON) 20 MEQ tablet   Oral   Take 1 tablet (20 mEq total) by mouth 2 (two) times daily.   14 tablet   0   . tiotropium (SPIRIVA HANDIHALER) 18 MCG inhalation capsule   Inhalation   Place 1 capsule (18 mcg total)  into inhaler and inhale daily.   30 capsule   0   . predniSONE (DELTASONE) 20 MG tablet   Oral   Take 2 tablets (40 mg total) by mouth daily.   12 tablet   0    BP 160/68  Pulse 79  Temp(Src) 97.9 F (36.6 C) (Oral)  Resp 18  SpO2 99% Physical Exam  Nursing note and vitals reviewed. Constitutional: She is oriented to person, place, and time. She appears well-developed and well-nourished. No distress.  HENT:  Head: Normocephalic and atraumatic.  Eyes: Conjunctivae are normal. No scleral icterus.  Neck: Normal range of motion. Neck supple.  Cardiovascular: Normal rate and intact distal pulses.   No murmur heard. Pulmonary/Chest: Effort normal. She has wheezes. She has no rales.  Abdominal: Soft. There is no rebound and no  guarding.  Musculoskeletal: Normal range of motion.  Neurological: She is alert and oriented to person, place, and time. She exhibits normal muscle tone. Coordination normal.  Skin: Skin is warm. No rash noted. She is not diaphoretic. No pallor.  Psychiatric: She has a normal mood and affect.    ED Course  Procedures (including critical care time) Labs Reviewed - No data to display No results found. 1. Bronchitis     RA sat is 99-100% and I interpret to be normal  1:49 PM RT has seen as well, peak flow improved.  Pt feels improved, has ambulated to bathroom and back, wheezing improved.  MDM  Pt is not sig distressed after 2 nebs total today.  Pt will need some education regardign preventative inhaler, Spiriva, will have RT see pt.  Pt reports she will make an appt with PCP for next week with [facility].  Will start oral steroids as well here and give Rx.  No abx needed.    Gavin Pound. Glendia Olshefski, MD 02/26/13 1406

## 2013-04-22 ENCOUNTER — Encounter (HOSPITAL_COMMUNITY): Payer: Self-pay | Admitting: Physical Medicine and Rehabilitation

## 2013-04-22 ENCOUNTER — Emergency Department (HOSPITAL_COMMUNITY)
Admission: EM | Admit: 2013-04-22 | Discharge: 2013-04-22 | Disposition: A | Discharge status: Home / Self Care | Payer: Medicaid Other | Attending: Emergency Medicine | Admitting: Emergency Medicine

## 2013-04-22 DIAGNOSIS — Z79899 Other long term (current) drug therapy: Secondary | ICD-10-CM | POA: Insufficient documentation

## 2013-04-22 DIAGNOSIS — R0789 Other chest pain: Secondary | ICD-10-CM | POA: Insufficient documentation

## 2013-04-22 DIAGNOSIS — I4891 Unspecified atrial fibrillation: Secondary | ICD-10-CM | POA: Insufficient documentation

## 2013-04-22 DIAGNOSIS — K219 Gastro-esophageal reflux disease without esophagitis: Secondary | ICD-10-CM | POA: Insufficient documentation

## 2013-04-22 DIAGNOSIS — Z87891 Personal history of nicotine dependence: Secondary | ICD-10-CM | POA: Insufficient documentation

## 2013-04-22 DIAGNOSIS — Z888 Allergy status to other drugs, medicaments and biological substances status: Secondary | ICD-10-CM | POA: Insufficient documentation

## 2013-04-22 DIAGNOSIS — J45901 Unspecified asthma with (acute) exacerbation: Secondary | ICD-10-CM | POA: Insufficient documentation

## 2013-04-22 DIAGNOSIS — I10 Essential (primary) hypertension: Secondary | ICD-10-CM | POA: Insufficient documentation

## 2013-04-22 MED ORDER — IPRATROPIUM BROMIDE 0.02 % IN SOLN
0.5000 mg | Freq: Once | RESPIRATORY_TRACT | Status: AC
  Administered 2013-04-22: 0.5 mg via RESPIRATORY_TRACT
  Filled 2013-04-22: qty 2.5

## 2013-04-22 MED ORDER — TIOTROPIUM BROMIDE MONOHYDRATE 18 MCG IN CAPS: 18.0000 ug | ORAL_CAPSULE | Freq: Every day | RESPIRATORY_TRACT | Status: DC

## 2013-04-22 MED ORDER — ALBUTEROL SULFATE (5 MG/ML) 0.5% IN NEBU
5.0000 mg | INHALATION_SOLUTION | Freq: Once | RESPIRATORY_TRACT | Status: AC
  Administered 2013-04-22: 5 mg via RESPIRATORY_TRACT
  Filled 2013-04-22: qty 1

## 2013-04-22 MED ORDER — ALBUTEROL SULFATE (5 MG/ML) 0.5% IN NEBU: 2.5000 mg | INHALATION_SOLUTION | RESPIRATORY_TRACT | Status: DC | PRN

## 2013-04-22 MED ORDER — PREDNISONE (PAK) 10 MG PO TABS: 10.0000 mg | ORAL_TABLET | Freq: Every day | ORAL | Status: DC

## 2013-04-22 MED ORDER — PREDNISONE 20 MG PO TABS
60.0000 mg | ORAL_TABLET | Freq: Once | ORAL | Status: AC
  Administered 2013-04-22: 60 mg via ORAL
  Filled 2013-04-22: qty 3

## 2013-04-22 MED ORDER — ALBUTEROL SULFATE HFA 108 (90 BASE) MCG/ACT IN AERS
2.0000 | INHALATION_SPRAY | Freq: Once | RESPIRATORY_TRACT | Status: AC
  Administered 2013-04-22: 2 via RESPIRATORY_TRACT
  Filled 2013-04-22: qty 6.7

## 2013-04-22 NOTE — ED Notes (Signed)
Pt stated "I ran out of my albuterol nebulizer 2 days ago" stated she wouldn't have to come if she had her abluterol tx"

## 2013-04-22 NOTE — ED Provider Notes (Signed)
Medical screening examination/treatment/procedure(s) were performed by non-physician practitioner and as supervising physician I was immediately available for consultation/collaboration.   Tate Zagal T Kyrene Longan, MD 04/22/13 1714 

## 2013-04-22 NOTE — ED Notes (Signed)
Pt presents to department for evaluation of asthma exacerbation. States she ran out of nebulizer treatments. Respirations unlabored. Speaking complete sentences. No signs of distress upon arrival. Ambulatory to triage.

## 2013-04-22 NOTE — ED Provider Notes (Signed)
CSN: [id number]     Arrival date & time 04/22/13  1308 History   First MD Initiated Contact with Patient 04/22/13 [phone]     Chief Complaint  Patient presents with  . Asthma   (Consider location/radiation/quality/duration/timing/severity/associated sxs/prior Treatment) HPI Comments: Patient reports that she ran out of her nebulizer medication 2 days ago and has been having persistent shortness of breath and wheezing since. The symptoms have been gradual. She has been trying to use her albuterol at bedtime date inhaler without relief. She is also out of Spiriva. States she also notes a little bit of chest tightness. Denies cough, fever, chills, other upper respiratory symptoms.  She called her new primary care provider but cannot be seen until October.   Patient is a 63 y.o. female presenting with asthma. The history is provided by the patient.  Asthma Pertinent negatives include no abdominal pain, chills, congestion, coughing, fever, nausea, sore throat or vomiting.    Past Medical History  Diagnosis Date  . Asthma   . Hypertension   . GERD (gastroesophageal reflux disease)   . A-fib    Past Surgical History  Procedure Laterality Date  . Tubal ligation     Family History  Problem Relation Age of Onset  . Cancer Mother   . Cancer Father   . Heart attack Mother 57   History  Substance Use Topics  . Smoking status: Former Smoker    Types: Cigarettes    Quit date: 11/07/2011  . Smokeless tobacco: Never Used  . Alcohol Use: No   OB History   Grav Para Term Preterm Abortions TAB SAB Ect Mult Living                 Review of Systems  Constitutional: Negative for fever and chills.  HENT: Negative for congestion and sore throat.   Respiratory: Positive for chest tightness, shortness of breath and wheezing. Negative for cough.   Gastrointestinal: Negative for nausea, vomiting, abdominal pain and diarrhea.    Allergies  Pantoprazole and Celecoxib  Home Medications    Current Outpatient Rx  Name  Route  Sig  Dispense  Refill  . albuterol (PROVENTIL HFA;VENTOLIN HFA) 108 (90 BASE) MCG/ACT inhaler   Inhalation   Inhale 2 puffs into the lungs every 4 (four) hours as needed for wheezing or shortness of breath.   1 Inhaler   3   . albuterol (PROVENTIL) (5 MG/ML) 0.5% nebulizer solution   Nebulization   Take 0.5 mLs (2.5 mg total) by nebulization every 4 (four) hours as needed for wheezing or shortness of breath.   20 mL   12   . hydrochlorothiazide (HYDRODIURIL) 12.5 MG tablet   Oral   Take 1 tablet (12.5 mg total) by mouth daily.   30 tablet   1   . potassium chloride SA (K-DUR,KLOR-CON) 20 MEQ tablet   Oral   Take 1 tablet (20 mEq total) by mouth 2 (two) times daily.   14 tablet   0   . predniSONE (DELTASONE) 20 MG tablet   Oral   Take 2 tablets (40 mg total) by mouth daily.   12 tablet   0   . tiotropium (SPIRIVA HANDIHALER) 18 MCG inhalation capsule   Inhalation   Place 1 capsule (18 mcg total) into inhaler and inhale daily.   30 capsule   0    BP 170/101  Pulse 71  Temp(Src) 98.5 F (36.9 C) (Oral)  Resp 20  SpO2 96% Physical Exam  Nursing note and vitals reviewed. Constitutional: She appears well-developed and well-nourished. No distress.  HENT:  Head: Normocephalic and atraumatic.  Neck: Neck supple.  Cardiovascular: Normal rate and regular rhythm.   Pulmonary/Chest: Accessory muscle usage present. No respiratory distress. She has decreased breath sounds. She has wheezes. She has no rhonchi. She has no rales.  Neurological: She is alert.  Skin: She is not diaphoretic.    ED Course  Procedures (including critical care time) Labs Review Labs Reviewed - No data to display Imaging Review No results found.  10:20 AM patient reports great improvement after nebulizer treatment. States she is no longer short of breath. On exam patient continues to have some wheezing but has full breath sounds in all fields. No longer  using accessory muscles. Patient has not tachypneic. Patient will be discharged home with home meds.   MDM   1. Asthma exacerbation    Patient with history of asthma out of her medications at home for 2 days. Patient feeling better after neb treatment here. The shortness of breath and wheezing was gradual in onset there was no pleuritic chest pain. Discharge home with home meds and prednisone taper. PCP followup. Discussed  findings, treatment, follow up  with patient.  Pt given return precautions.  Pt verbalizes understanding and agrees with plan.     I doubt any other EMC precluding discharge at this time including, but not necessarily limited to the following: ACS, PE, pneumonia   Trixie Dredge, PA-C 04/22/13 1024

## 2013-04-23 IMAGING — CR DG CHEST 2V
2 series · 2 of 2 positions shown · non-contrast
Comparison: 03/24/2010

CLINICAL DATA: Asthma, shortness of breath.

CHEST - 2 VIEW

[w chest pa]
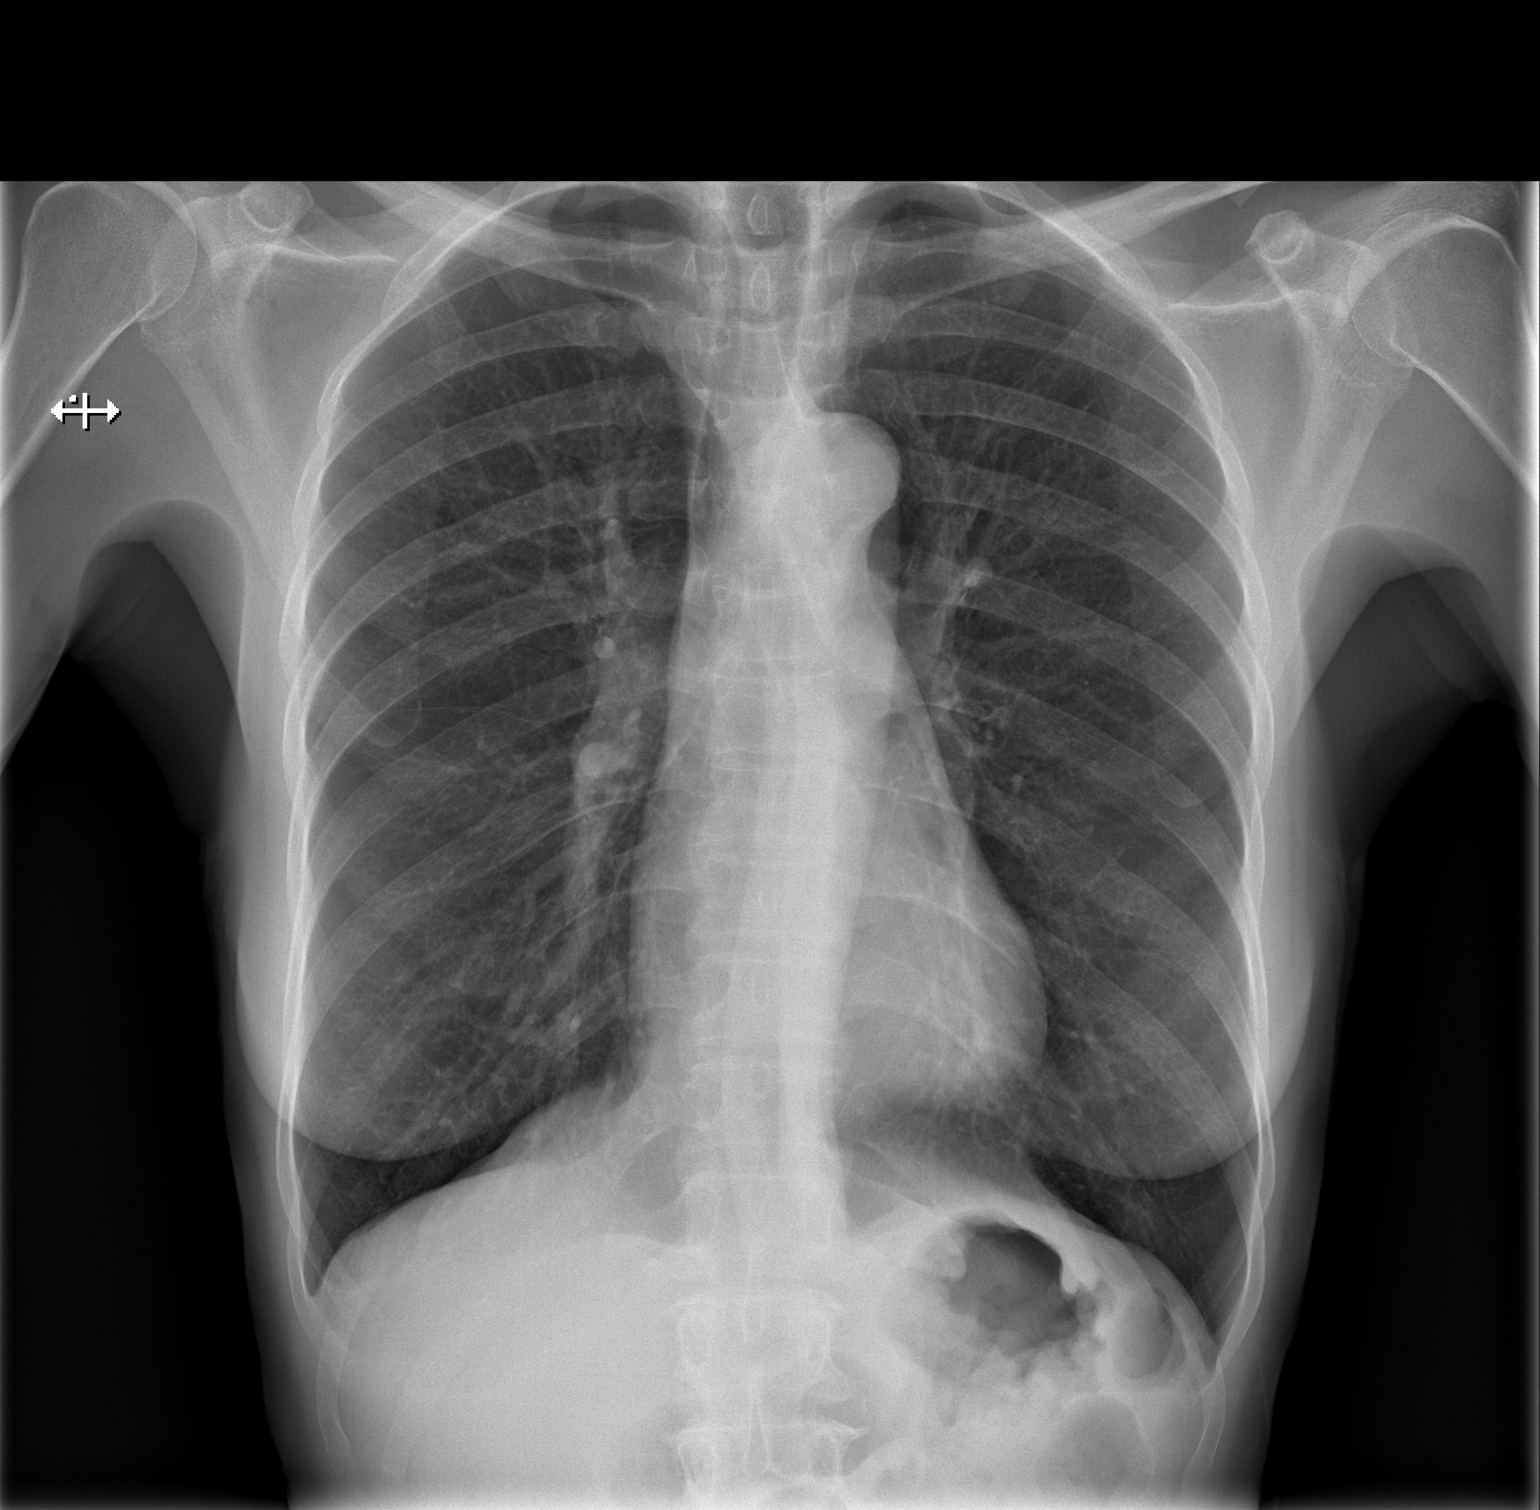

[w chest lat]
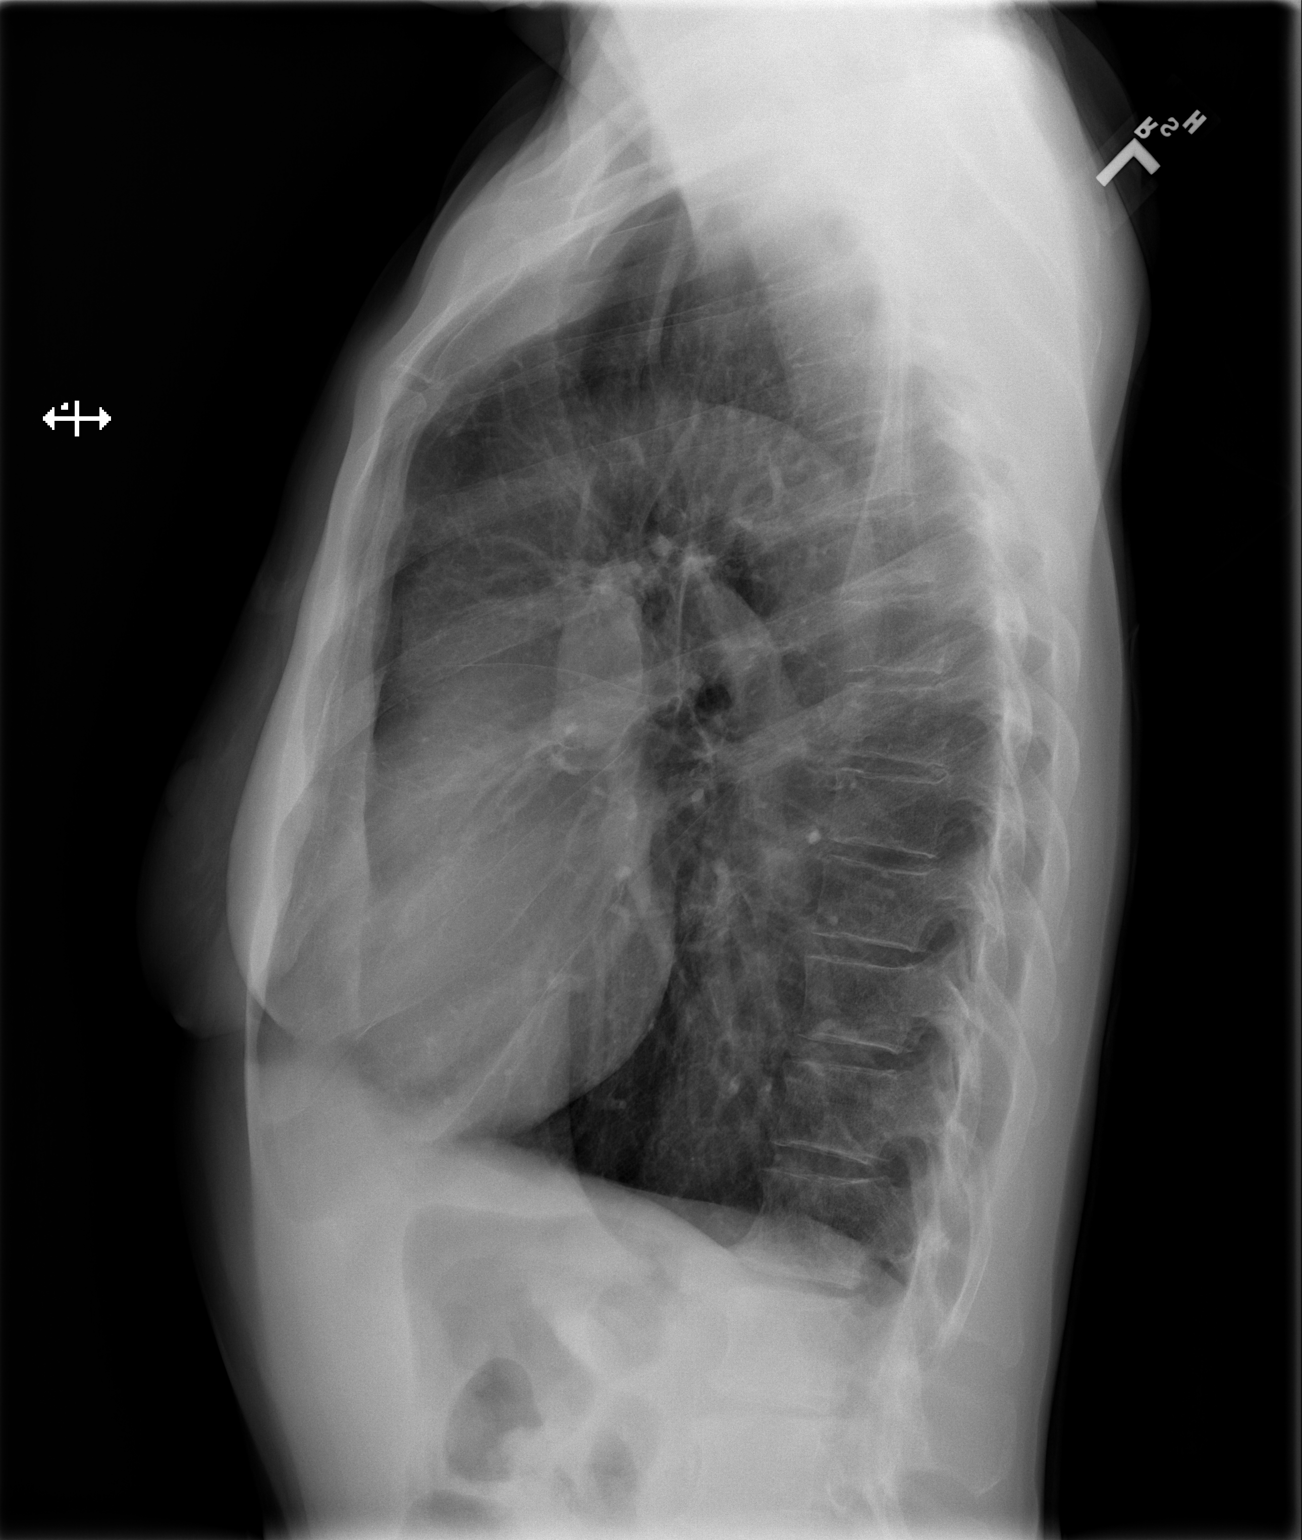

[2 of 2 positions shown; findings below may reference images not displayed]

FINDINGS: There is hyperinflation of the lungs compatible with
COPD.  Heart and mediastinal contours are within normal limits.  No
focal opacities or effusions.  No acute bony abnormality.
IMPRESSION: COPD.  No active disease.

## 2013-05-05 IMAGING — CR DG CHEST 1V PORT
1 series · 1 of 1 positions shown · non-contrast
Comparison: 01/06/2012

CLINICAL DATA: Acute onset shortness of breath.

PORTABLE CHEST - 1 VIEW

[AP]
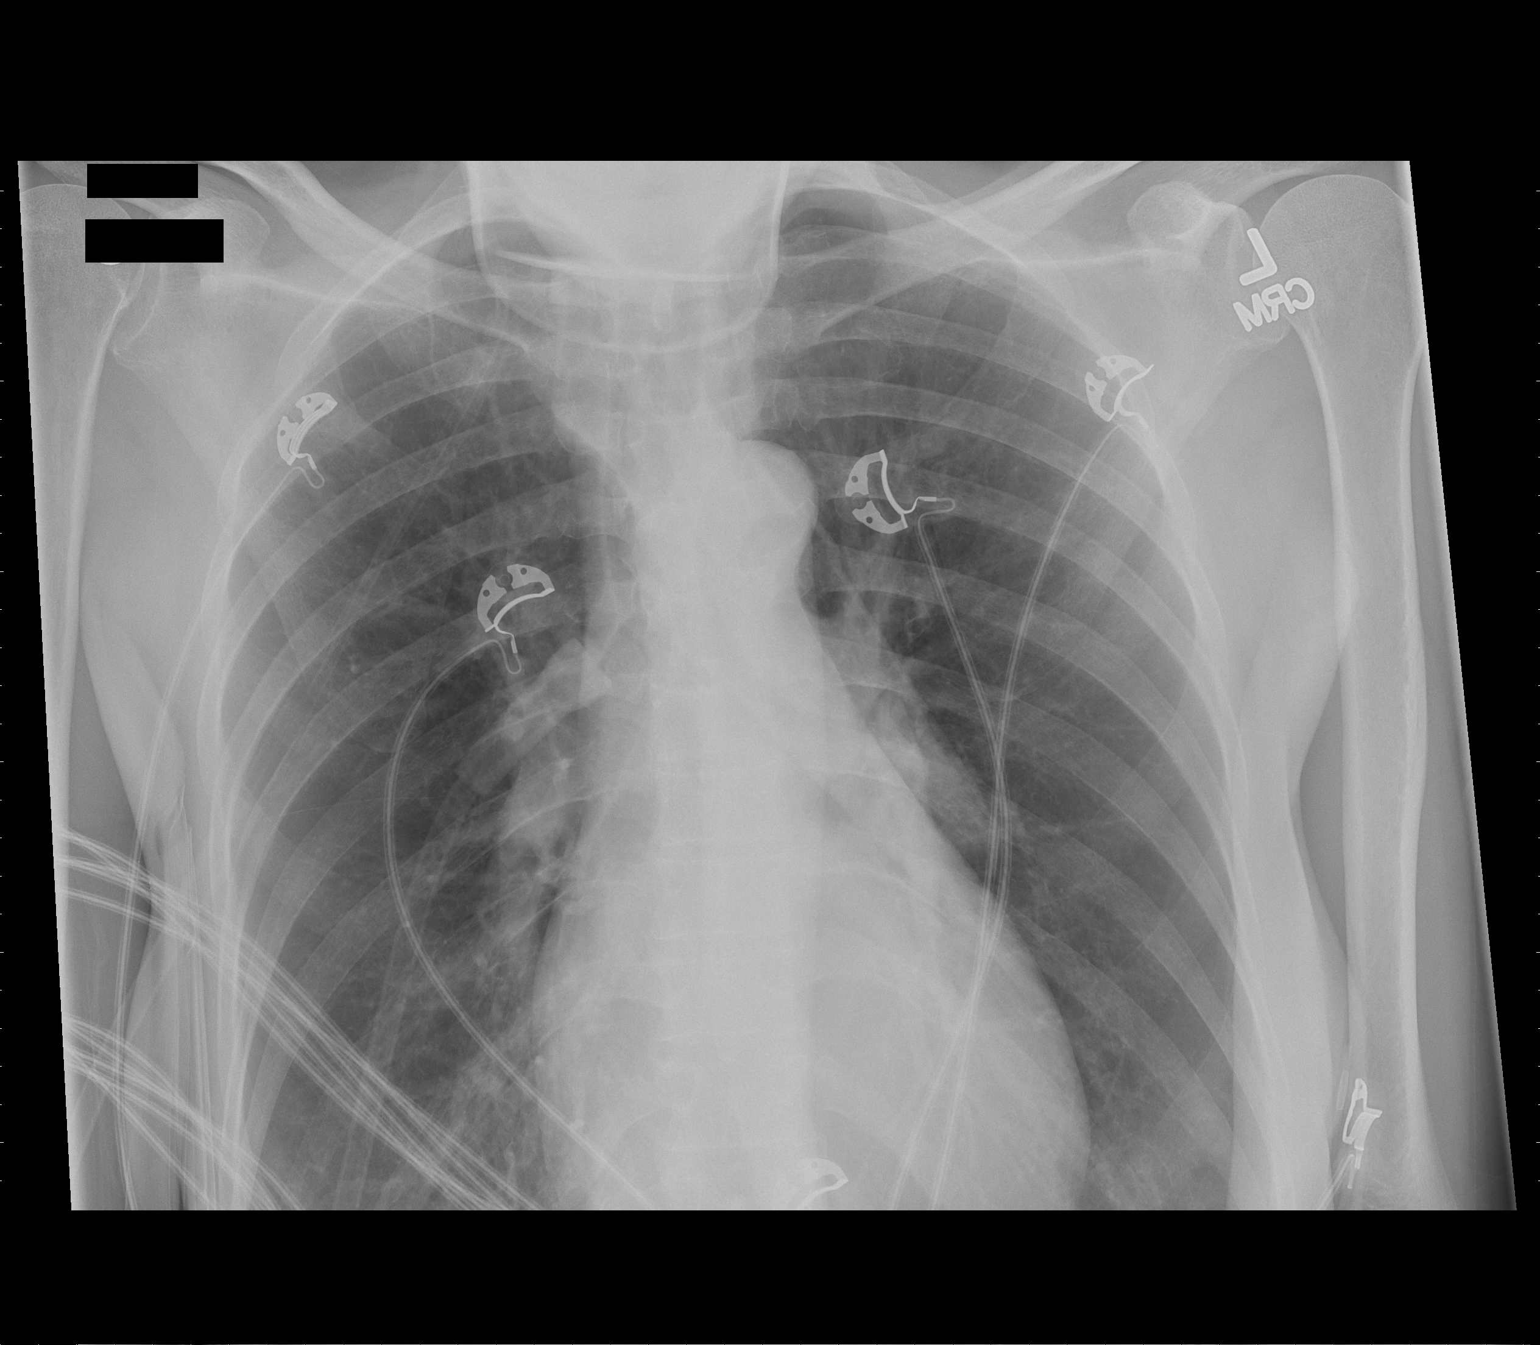

[1 of 1 positions shown; findings below may reference images not displayed]

FINDINGS: Normal heart size and pulmonary vascularity.
Emphysematous changes in the lungs.  No focal airspace
consolidation.  No blunting of costophrenic angles.  No
pneumothorax.  No significant change since previous study.
IMPRESSION: Emphysematous changes in the lungs.  No evidence of active
pulmonary disease.

## 2013-05-24 IMAGING — CR DG CHEST 2V
2 series · 2 of 2 positions shown · non-contrast
Comparison: 01/18/2012

CLINICAL DATA: Shortness of breath

CHEST - 2 VIEW

[w chest pa]
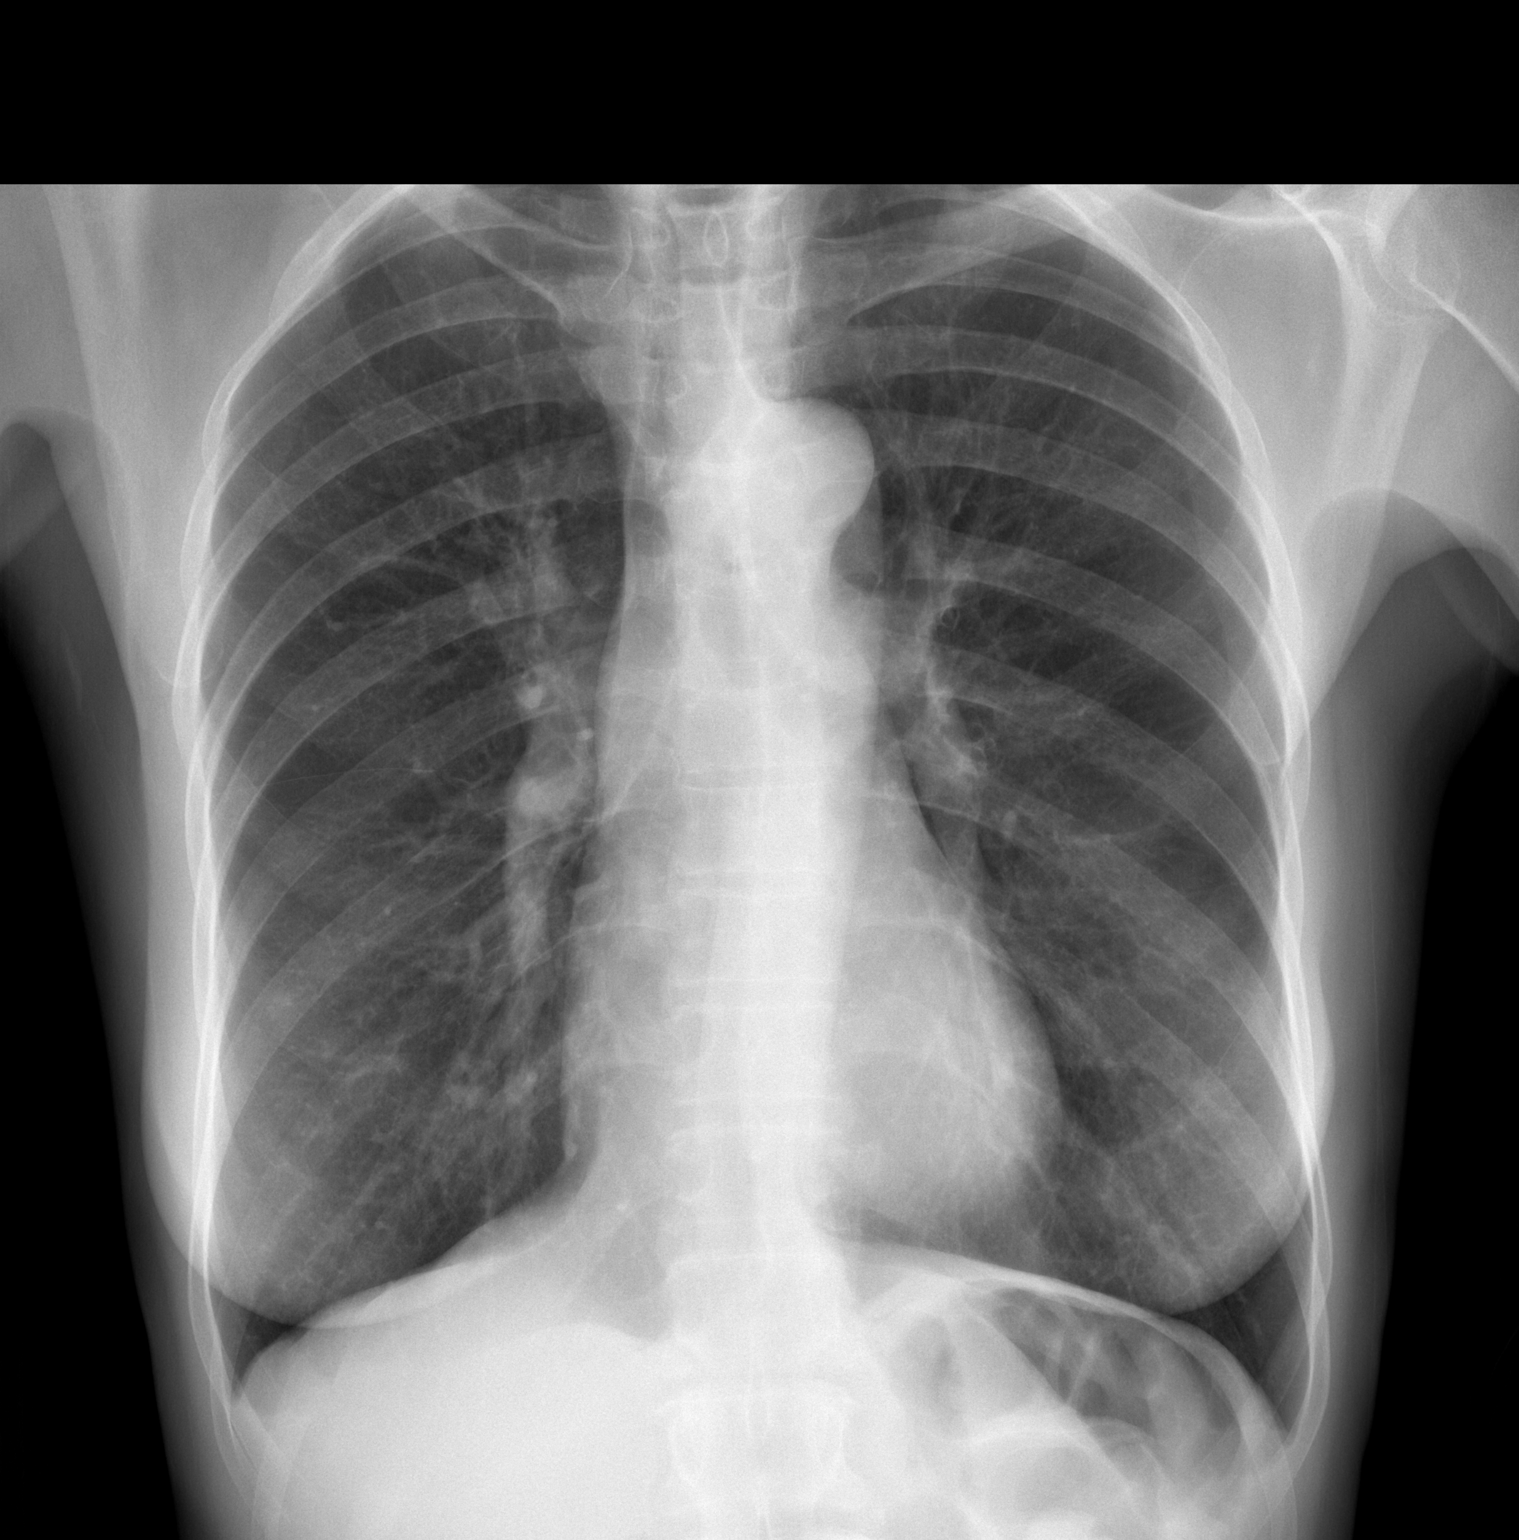

[w chest lat]
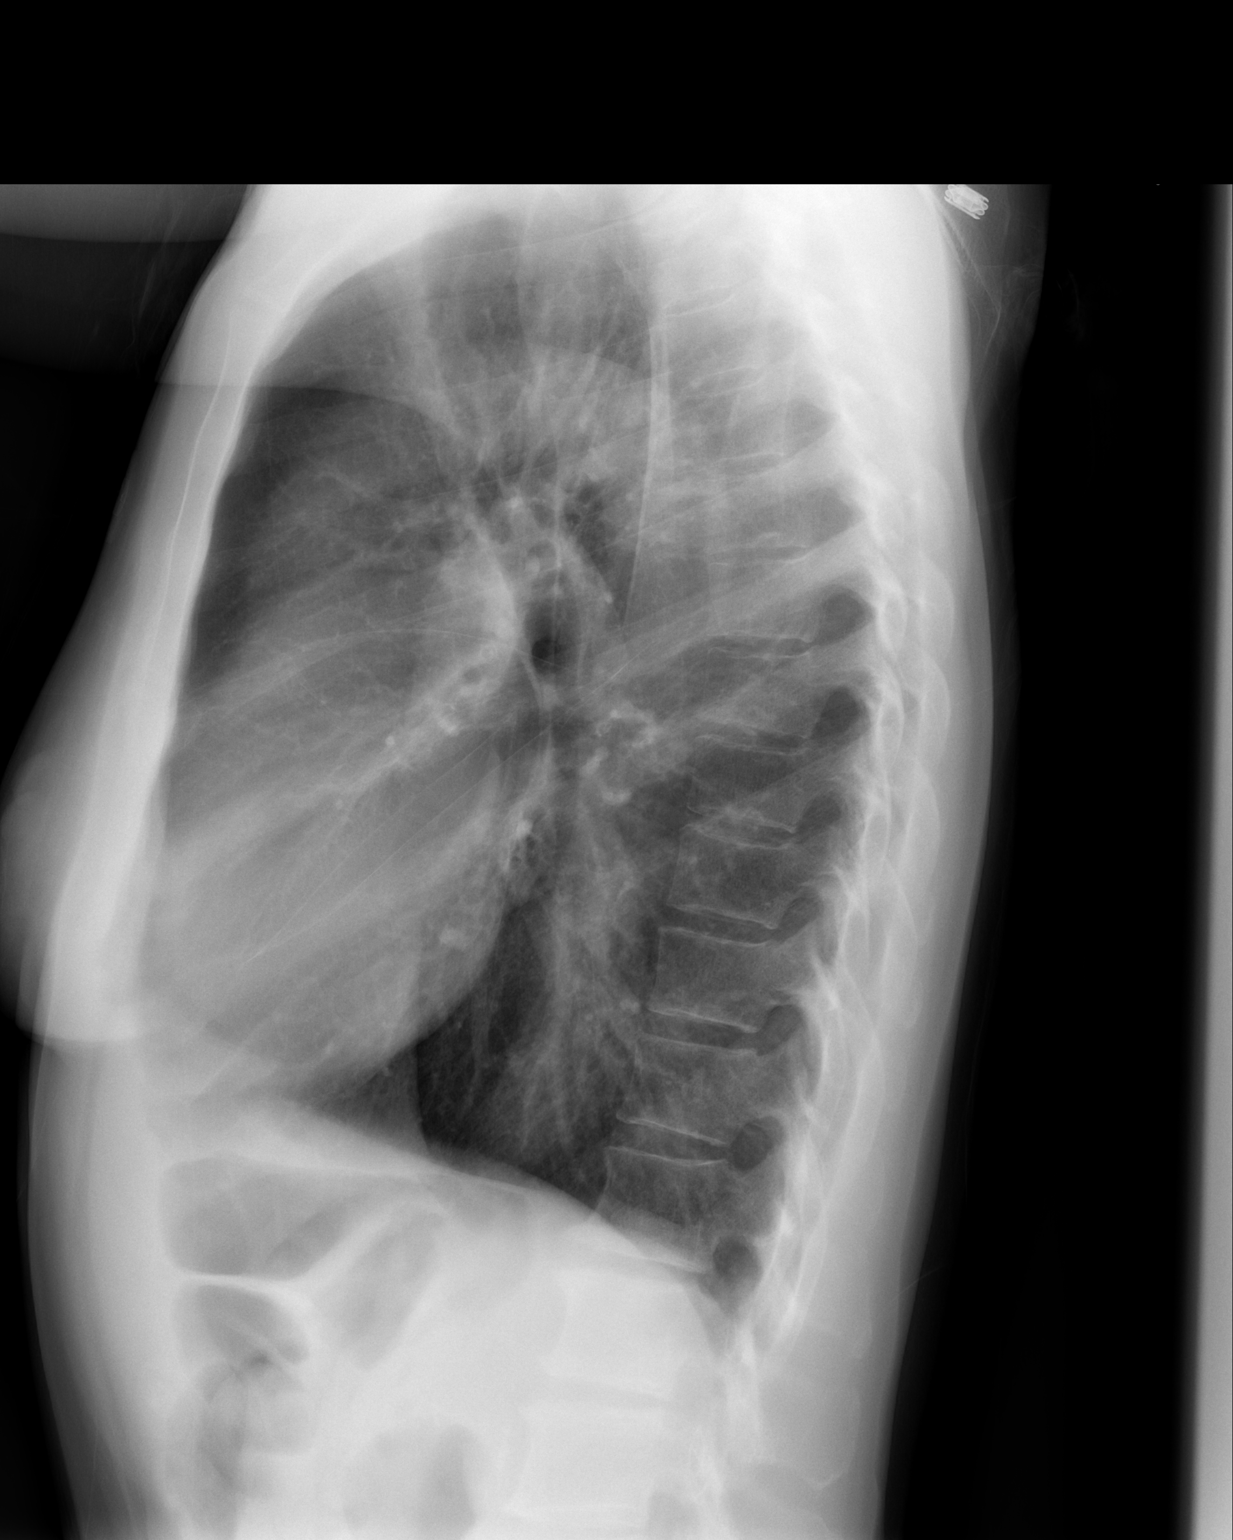

[2 of 2 positions shown; findings below may reference images not displayed]

FINDINGS: Hyperinflation may be seen with the provided history of
asthma versus COPD. Cardiomediastinal silhouette is within normal
limits. The lungs are clear. No pleural effusion.  No pneumothorax.
No acute osseous abnormality.
IMPRESSION: No acute process.  Emphysematous changes/hyperinflation again
noted.

## 2013-06-09 IMAGING — CR DG CHEST 2V
1 series · 1 of 1 positions shown · non-contrast
Comparison: Two-view chest x-ray 02/06/2012, 01/06/2012,
10/11/2009.  CT chest 03/04/2011.

CLINICAL DATA: Shortness of breath.  Wheezing.  History of asthma.

CHEST - 2 VIEW

[w chest lat]
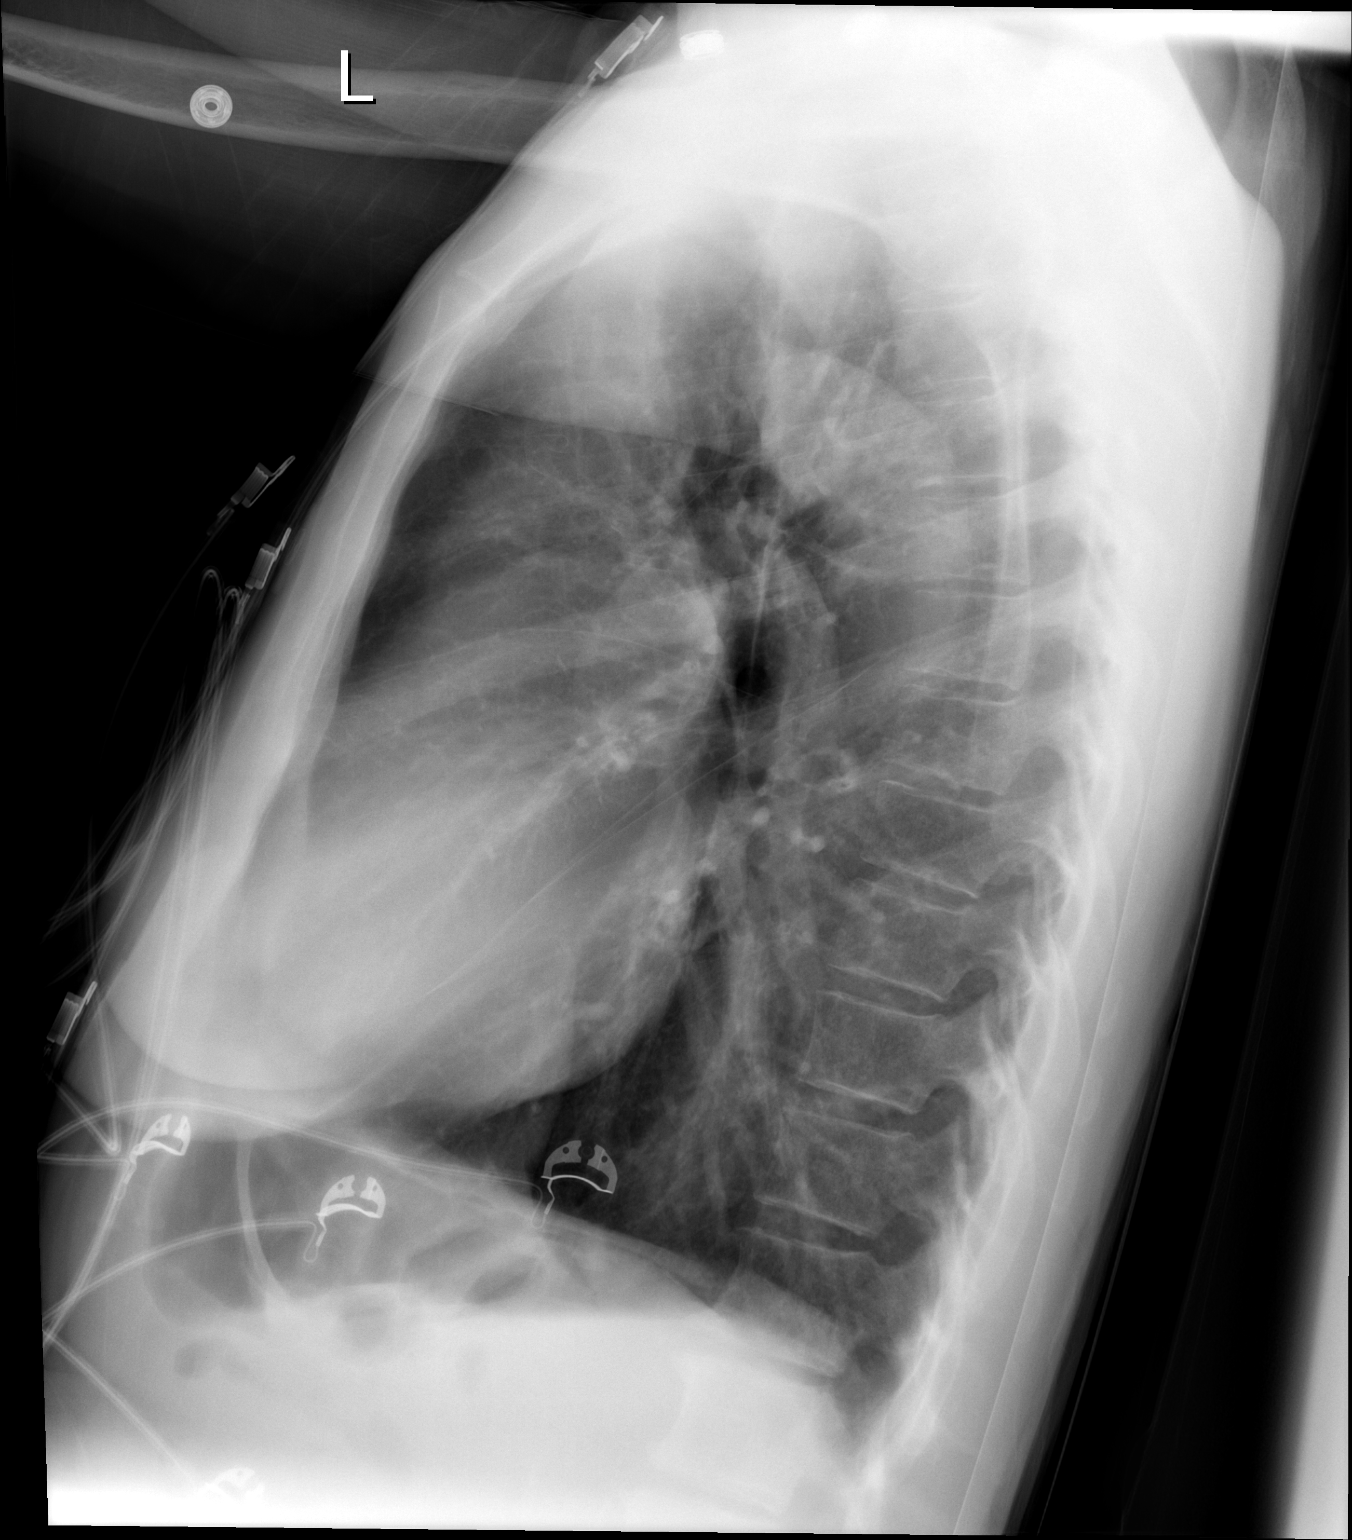

[1 of 1 positions shown; findings below may reference images not displayed]

FINDINGS: Cardiac silhouette upper normal in size for the AP
technique, unchanged when allowing for differences in technique.
Thoracic aorta mildly tortuous.  Hilar and mediastinal contours
otherwise unremarkable.  Lungs hyperinflated but clear.  No pleural
effusions.  Visualized bony thorax intact.  No significant interval
change.
IMPRESSION: Hyperinflation consistent with COPD and/or asthma.  No acute
cardiopulmonary disease.  Stable borderline heart size.

## 2013-06-10 IMAGING — CR DG CHEST 1V PORT
1 series · 1 of 1 positions shown · non-contrast
Comparison: Yesterday

CLINICAL DATA: Short of breath.  Cough.  Asthma.

PORTABLE CHEST - 1 VIEW

[AP]
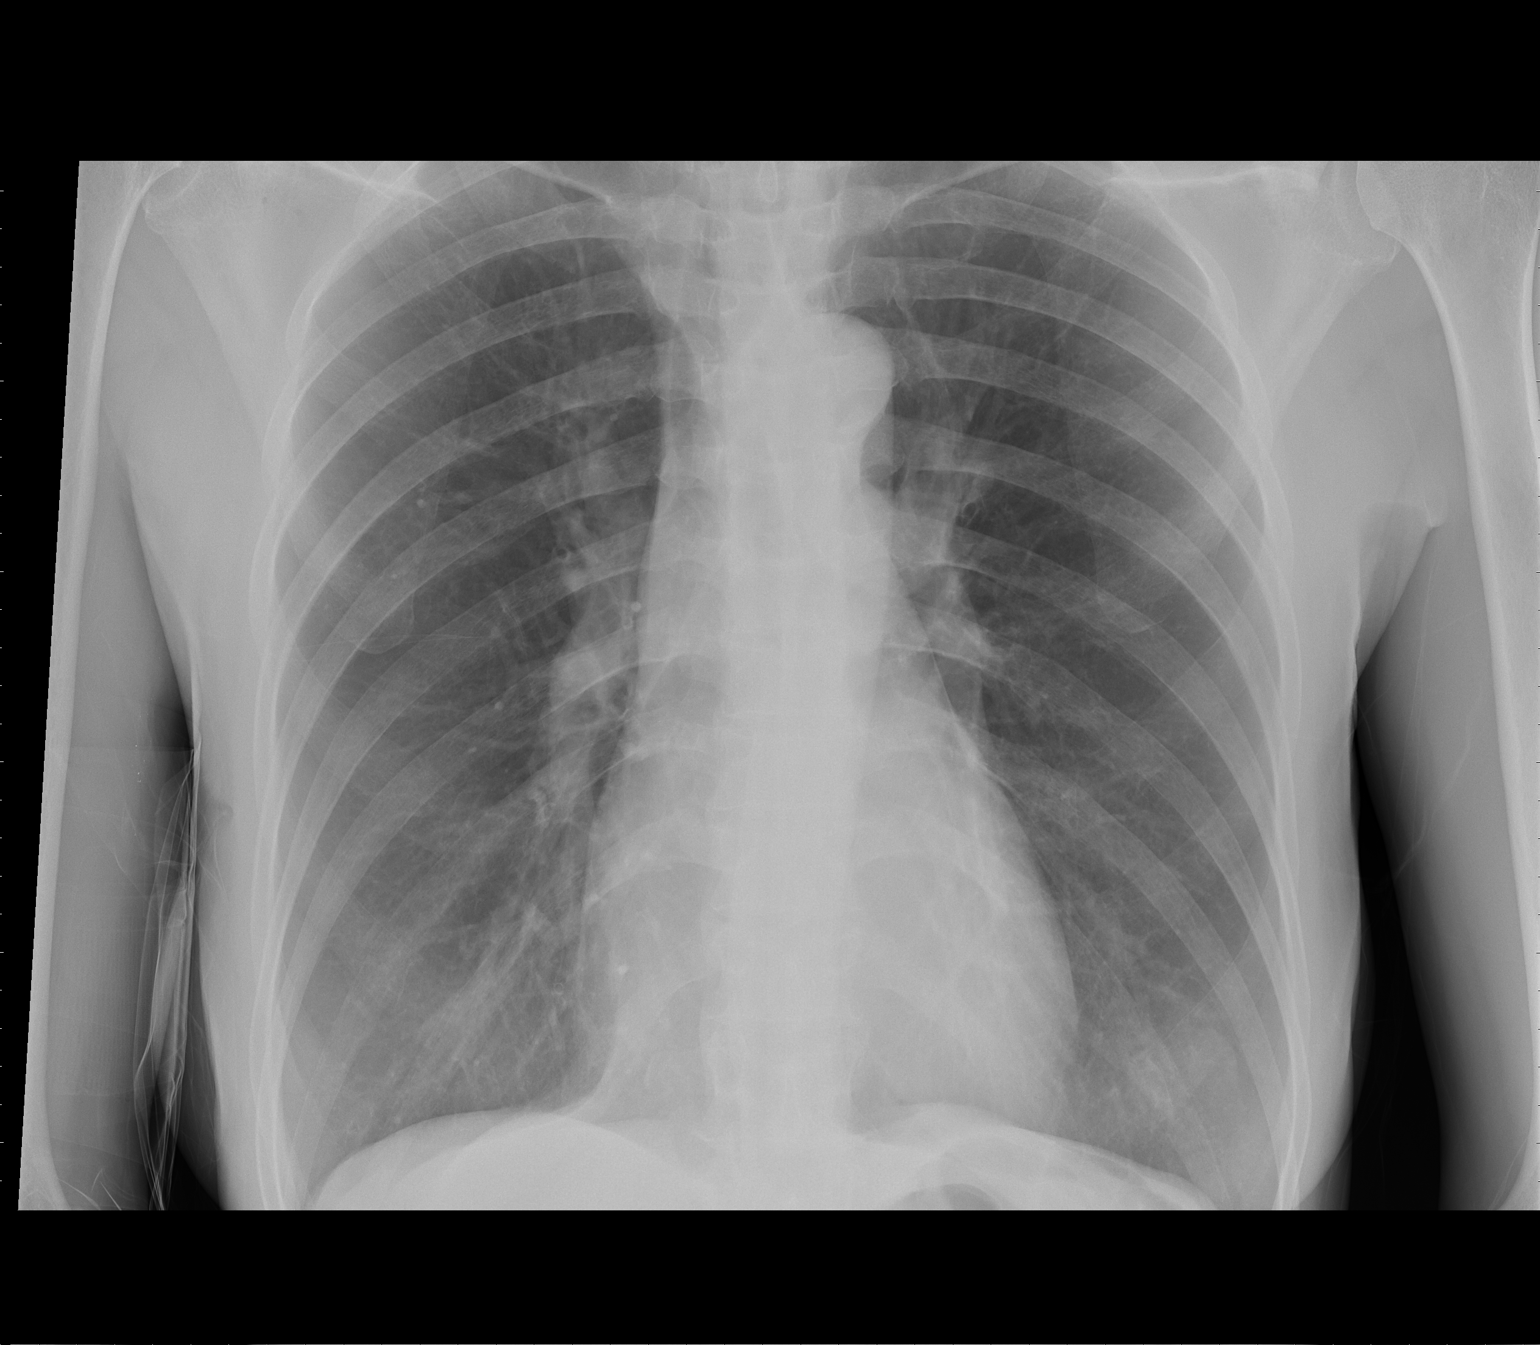

[1 of 1 positions shown; findings below may reference images not displayed]

FINDINGS: Hyperaeration.  Clear lungs.  Upper normal heart size.
Bronchitic changes.  No pneumothorax.
IMPRESSION: Changes related to COPD.

## 2013-06-22 ENCOUNTER — Emergency Department (HOSPITAL_COMMUNITY)
Admission: EM | Admit: 2013-06-22 | Discharge: 2013-06-22 | Disposition: A | Discharge status: Home / Self Care | Payer: Medicare Other | Attending: Emergency Medicine | Admitting: Emergency Medicine

## 2013-06-22 ENCOUNTER — Encounter (HOSPITAL_COMMUNITY): Payer: Self-pay | Admitting: Emergency Medicine

## 2013-06-22 DIAGNOSIS — J45901 Unspecified asthma with (acute) exacerbation: Secondary | ICD-10-CM | POA: Insufficient documentation

## 2013-06-22 DIAGNOSIS — Z87891 Personal history of nicotine dependence: Secondary | ICD-10-CM | POA: Insufficient documentation

## 2013-06-22 DIAGNOSIS — Z79899 Other long term (current) drug therapy: Secondary | ICD-10-CM | POA: Insufficient documentation

## 2013-06-22 DIAGNOSIS — I10 Essential (primary) hypertension: Secondary | ICD-10-CM | POA: Insufficient documentation

## 2013-06-22 DIAGNOSIS — Z8719 Personal history of other diseases of the digestive system: Secondary | ICD-10-CM | POA: Insufficient documentation

## 2013-06-22 DIAGNOSIS — IMO0002 Reserved for concepts with insufficient information to code with codable children: Secondary | ICD-10-CM | POA: Insufficient documentation

## 2013-06-22 DIAGNOSIS — J45909 Unspecified asthma, uncomplicated: Secondary | ICD-10-CM

## 2013-06-22 MED ORDER — ALBUTEROL SULFATE (5 MG/ML) 0.5% IN NEBU: 2.5000 mg | INHALATION_SOLUTION | RESPIRATORY_TRACT | Status: DC | PRN

## 2013-06-22 MED ORDER — TIOTROPIUM BROMIDE MONOHYDRATE 18 MCG IN CAPS: 18.0000 ug | ORAL_CAPSULE | Freq: Every day | RESPIRATORY_TRACT | Status: DC

## 2013-06-22 MED ORDER — ALBUTEROL SULFATE HFA 108 (90 BASE) MCG/ACT IN AERS
2.0000 | INHALATION_SPRAY | RESPIRATORY_TRACT | Status: DC
  Administered 2013-06-22: 2 via RESPIRATORY_TRACT
  Filled 2013-06-22: qty 6.7

## 2013-06-22 NOTE — ED Notes (Signed)
Cough and congestion and some sob ran out  inhaler

## 2013-06-22 NOTE — ED Provider Notes (Signed)
CSN: [id number]     Arrival date & time 06/22/13  4098 History   First MD Initiated Contact with Patient 06/22/13 0900     Chief Complaint  Patient presents with  . Cough   (Consider location/radiation/quality/duration/timing/severity/associated sxs/prior Treatment) The history is provided by the patient.   patient here complaining of being out of her inhaler. Took her nebulizer at home for being short of breath similar to her asthma. Says he feels back to her baseline. Denies any active cough or fever. No chest pain chest pressure. States she needs a refill of her albuterol inhaler. No CHF type symptoms. No other complaints of  Past Medical History  Diagnosis Date  . Asthma   . Hypertension   . GERD (gastroesophageal reflux disease)   . A-fib    Past Surgical History  Procedure Laterality Date  . Tubal ligation     Family History  Problem Relation Age of Onset  . Cancer Mother   . Cancer Father   . Heart attack Mother 35   History  Substance Use Topics  . Smoking status: Former Smoker    Types: Cigarettes    Quit date: 11/07/2011  . Smokeless tobacco: Never Used  . Alcohol Use: No   OB History   Grav Para Term Preterm Abortions TAB SAB Ect Mult Living                 Review of Systems  All other systems reviewed and are negative.    Allergies  Pantoprazole and Celecoxib  Home Medications   Current Outpatient Rx  Name  Route  Sig  Dispense  Refill  . albuterol (PROVENTIL HFA;VENTOLIN HFA) 108 (90 BASE) MCG/ACT inhaler   Inhalation   Inhale 2 puffs into the lungs every 4 (four) hours as needed for wheezing or shortness of breath.   1 Inhaler   3   . albuterol (PROVENTIL) (5 MG/ML) 0.5% nebulizer solution   Nebulization   Take 0.5 mLs (2.5 mg total) by nebulization every 4 (four) hours as needed for wheezing or shortness of breath.   20 mL   12   . hydrochlorothiazide (HYDRODIURIL) 12.5 MG tablet   Oral   Take 1 tablet (12.5 mg total) by mouth  daily.   30 tablet   1   . potassium chloride SA (K-DUR,KLOR-CON) 20 MEQ tablet   Oral   Take 1 tablet (20 mEq total) by mouth 2 (two) times daily.   14 tablet   0   . predniSONE (DELTASONE) 20 MG tablet   Oral   Take 2 tablets (40 mg total) by mouth daily.   12 tablet   0   . predniSONE (STERAPRED UNI-PAK) 10 MG tablet   Oral   Take 1 tablet (10 mg total) by mouth daily. Day 1: take 6 tabs.  Day 2: 5 tabs  Day 3: 4 tabs  Day 4: 3 tabs  Day 5: 2 tabs  Day 6: 1 tab   21 tablet   0   . tiotropium (SPIRIVA HANDIHALER) 18 MCG inhalation capsule   Inhalation   Place 1 capsule (18 mcg total) into inhaler and inhale daily.   30 capsule   0    There were no vitals taken for this visit. Physical Exam  Nursing note and vitals reviewed. Constitutional: She is oriented to person, place, and time. She appears well-developed and well-nourished.  Non-toxic appearance. No distress.  HENT:  Head: Normocephalic and atraumatic.  Eyes: Conjunctivae, EOM and  lids are normal. Pupils are equal, round, and reactive to light.  Neck: Normal range of motion. Neck supple. No tracheal deviation present. No mass present.  Cardiovascular: Normal rate, regular rhythm and normal heart sounds.  Exam reveals no gallop.   No murmur heard. Pulmonary/Chest: Effort normal and breath sounds normal. No stridor. No respiratory distress. She has no decreased breath sounds. She has no wheezes. She has no rhonchi. She has no rales.  Abdominal: Soft. Normal appearance and bowel sounds are normal. She exhibits no distension. There is no tenderness. There is no rebound and no CVA tenderness.  Musculoskeletal: Normal range of motion. She exhibits no edema and no tenderness.  Neurological: She is alert and oriented to person, place, and time. She has normal strength. No cranial nerve deficit or sensory deficit. GCS eye subscore is 4. GCS verbal subscore is 5. GCS motor subscore is 6.  Skin: Skin is warm and dry. No  abrasion and no rash noted.  Psychiatric: She has a normal mood and affect. Her speech is normal and behavior is normal.    ED Course  Procedures (including critical care time) Labs Review Labs Reviewed - No data to display Imaging Review No results found.  EKG Interpretation   None       MDM  No diagnosis found. Patient given refill of her albuterol inhaler and will followup with her Dr. as needed    Toy Baker, MD 06/22/13 [phone]

## 2013-07-24 IMAGING — CR DG CHEST 2V
2 series · 2 of 2 positions shown · non-contrast
Comparison: Multiple priors, 02/22/2012.

CLINICAL DATA: Short of breath.  Wheezing.  Diabetes.

CHEST - 2 VIEW

[w chest pa]
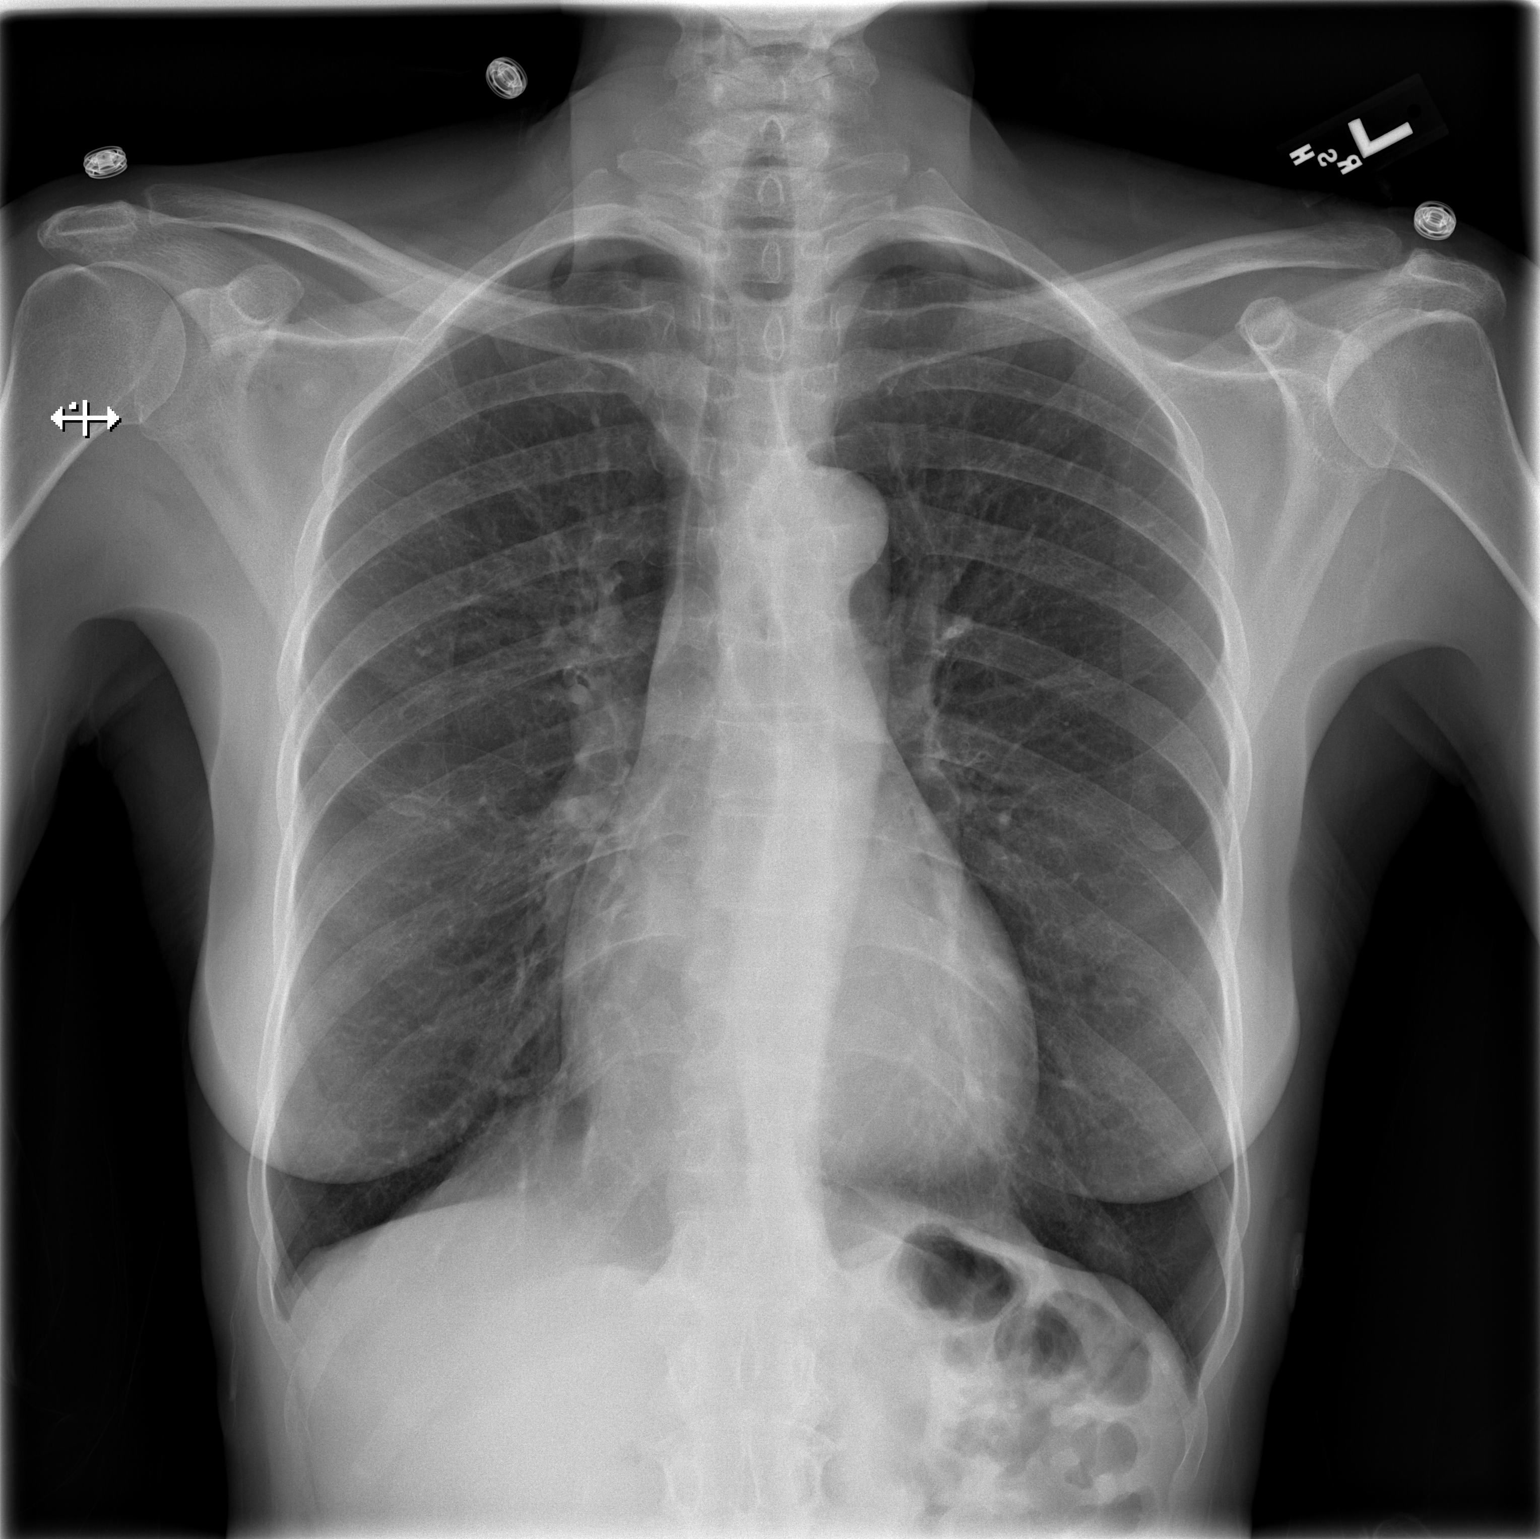

[w chest lat]
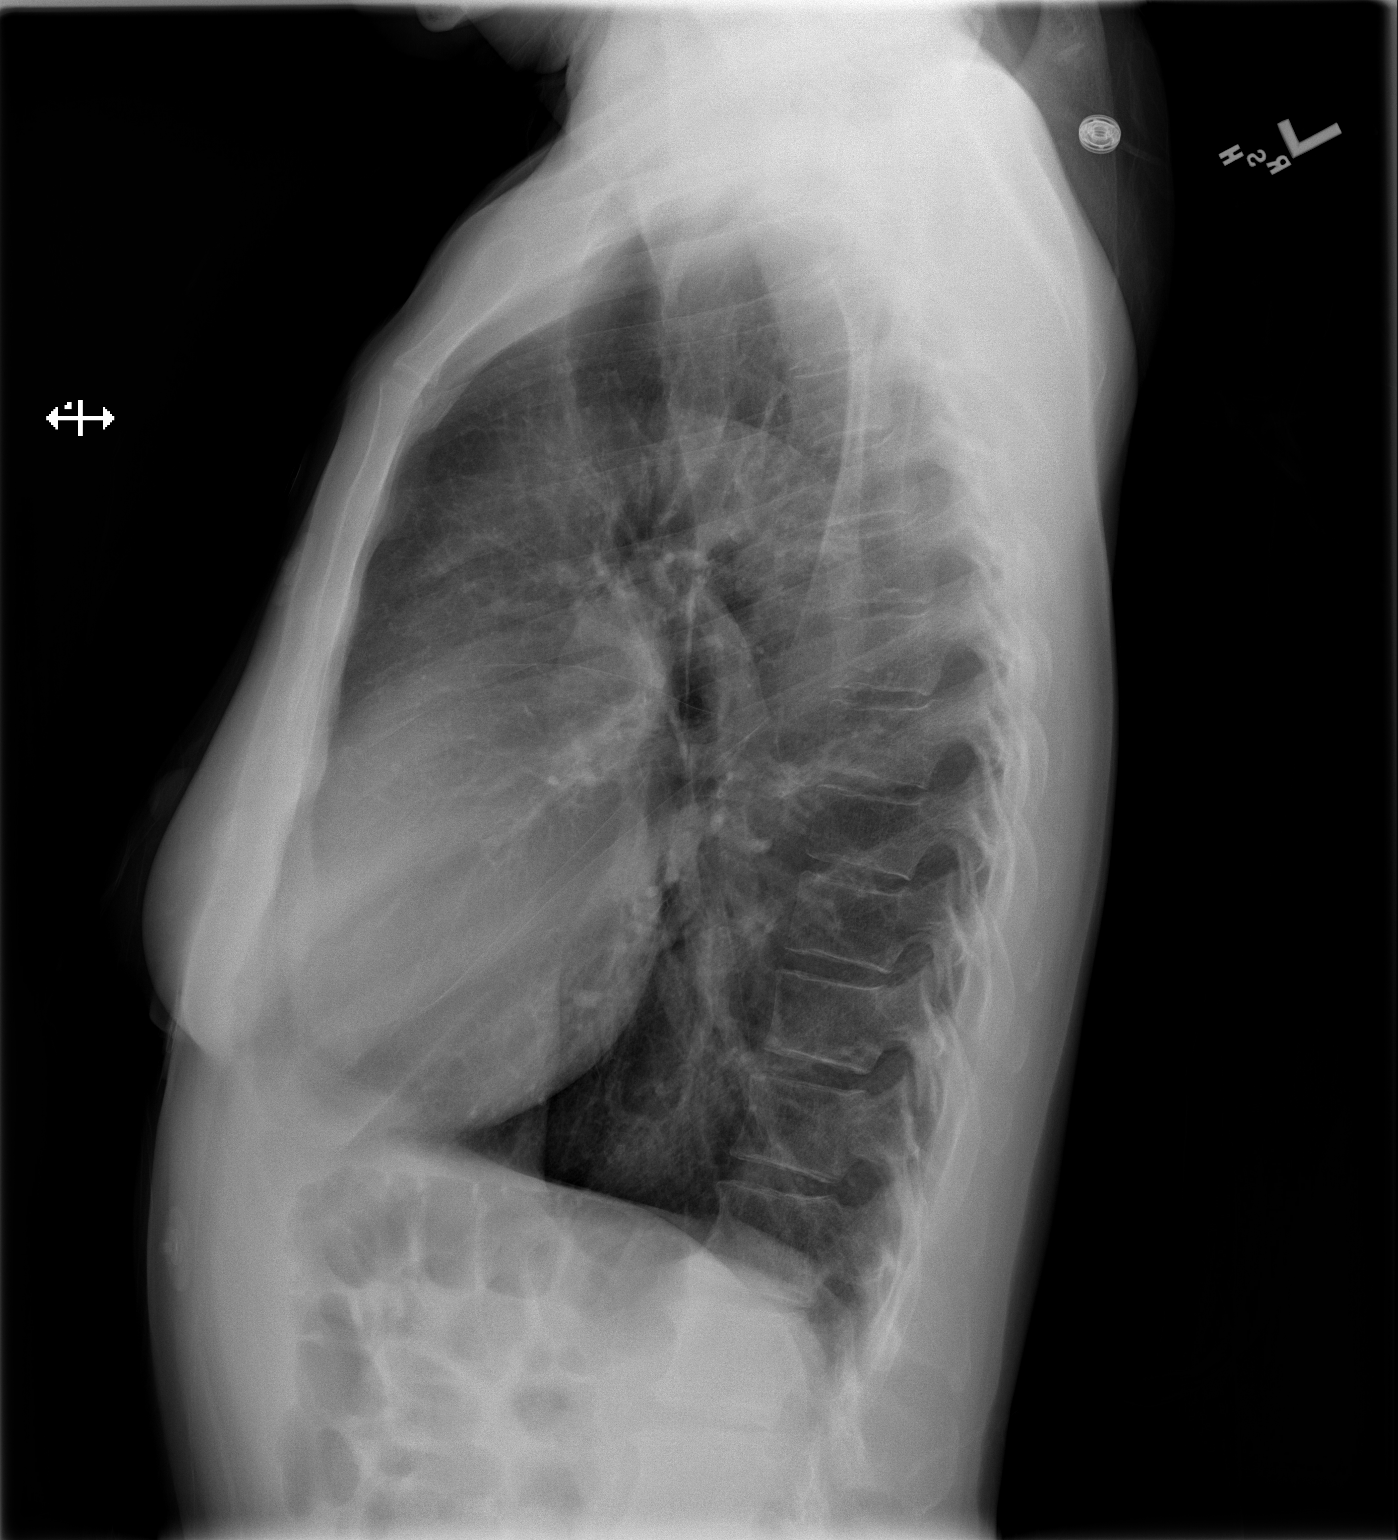

[2 of 2 positions shown; findings below may reference images not displayed]

FINDINGS: There is a tiny focus of patchy density in the right mid lung
suspicious for small focus of pneumonia.  This is new compared to
prior comparison radiographs.  Emphysema is present.
Cardiopericardial silhouette appears within normal limits.  No
effusions. Follow-up to ensure radiographic clearing recommended.
Clearing is usually observed at 8 weeks.
IMPRESSION: Tiny focus of right midlung airspace opacity suspicious for
pneumonia.  Chronic changes of emphysema.

## 2013-08-10 IMAGING — CR DG CHEST 2V
2 series · 2 of 2 positions shown · non-contrast
Comparison: 04/07/2012

CLINICAL DATA: Cough and weakness.

CHEST - 2 VIEW

[w chest pa]
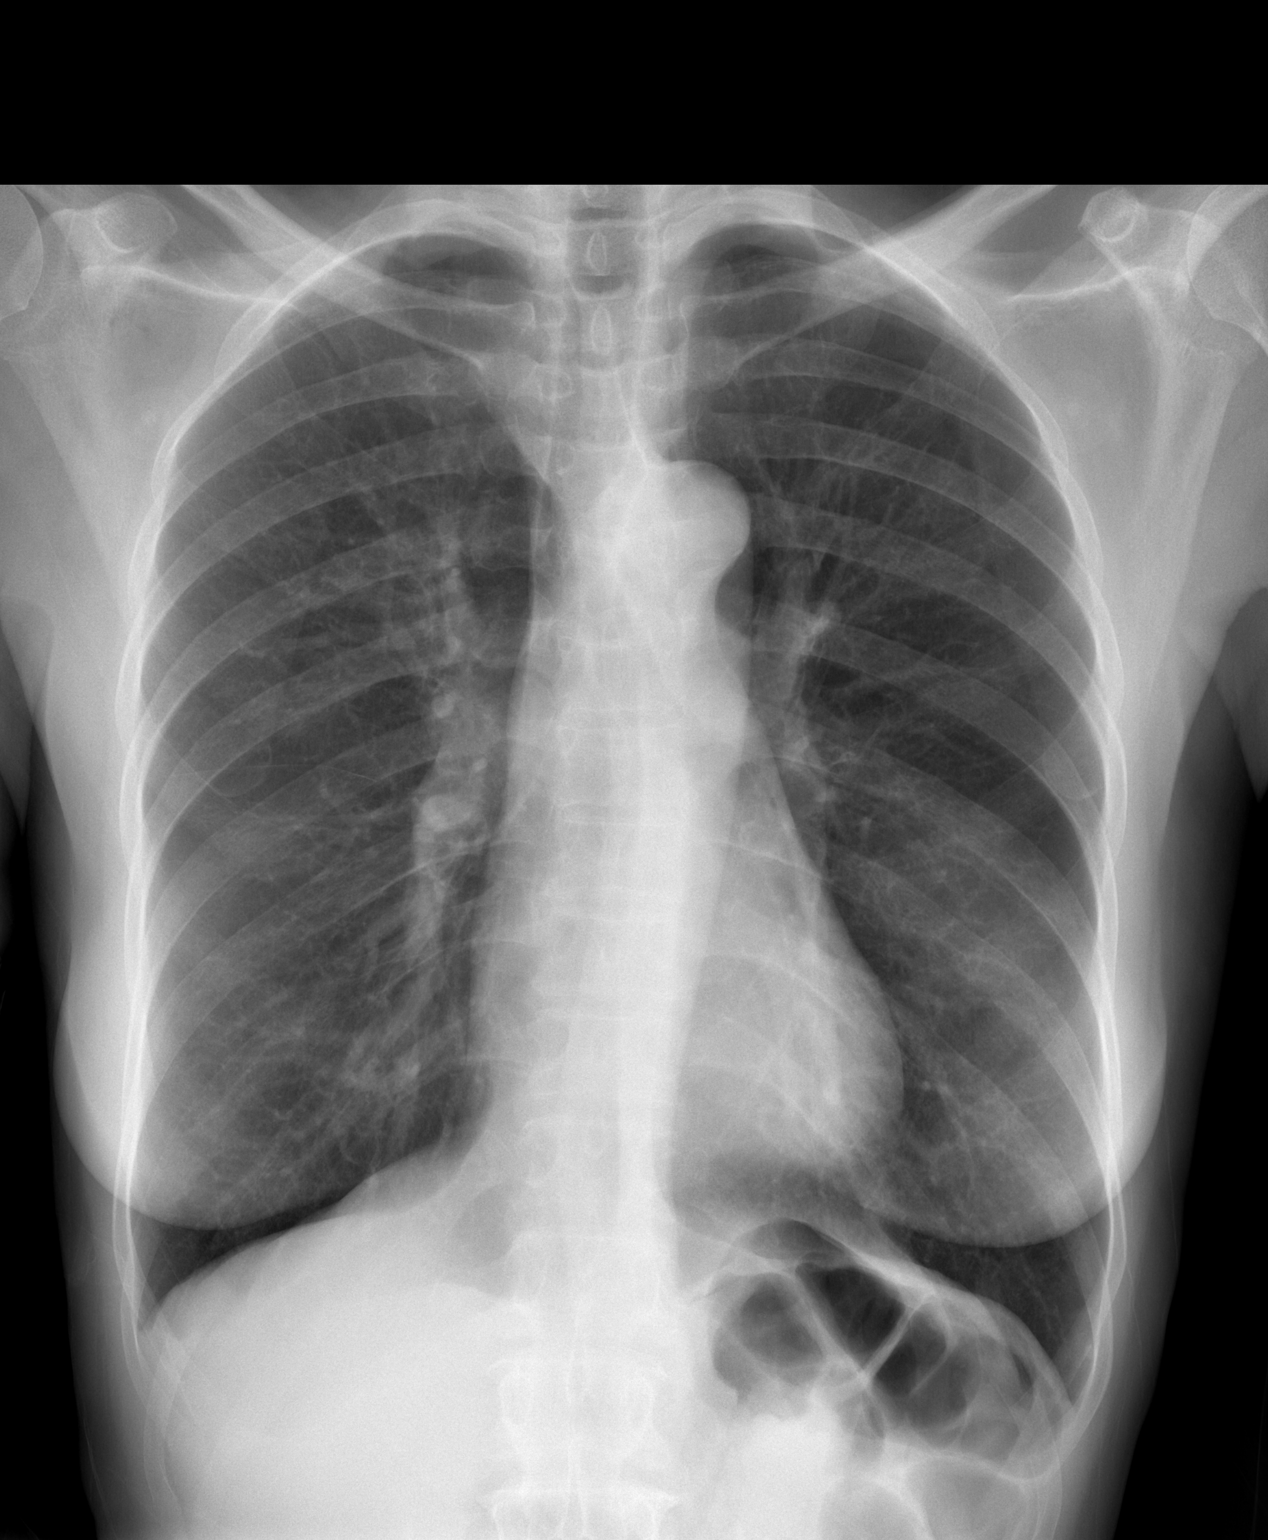

[w chest lat]
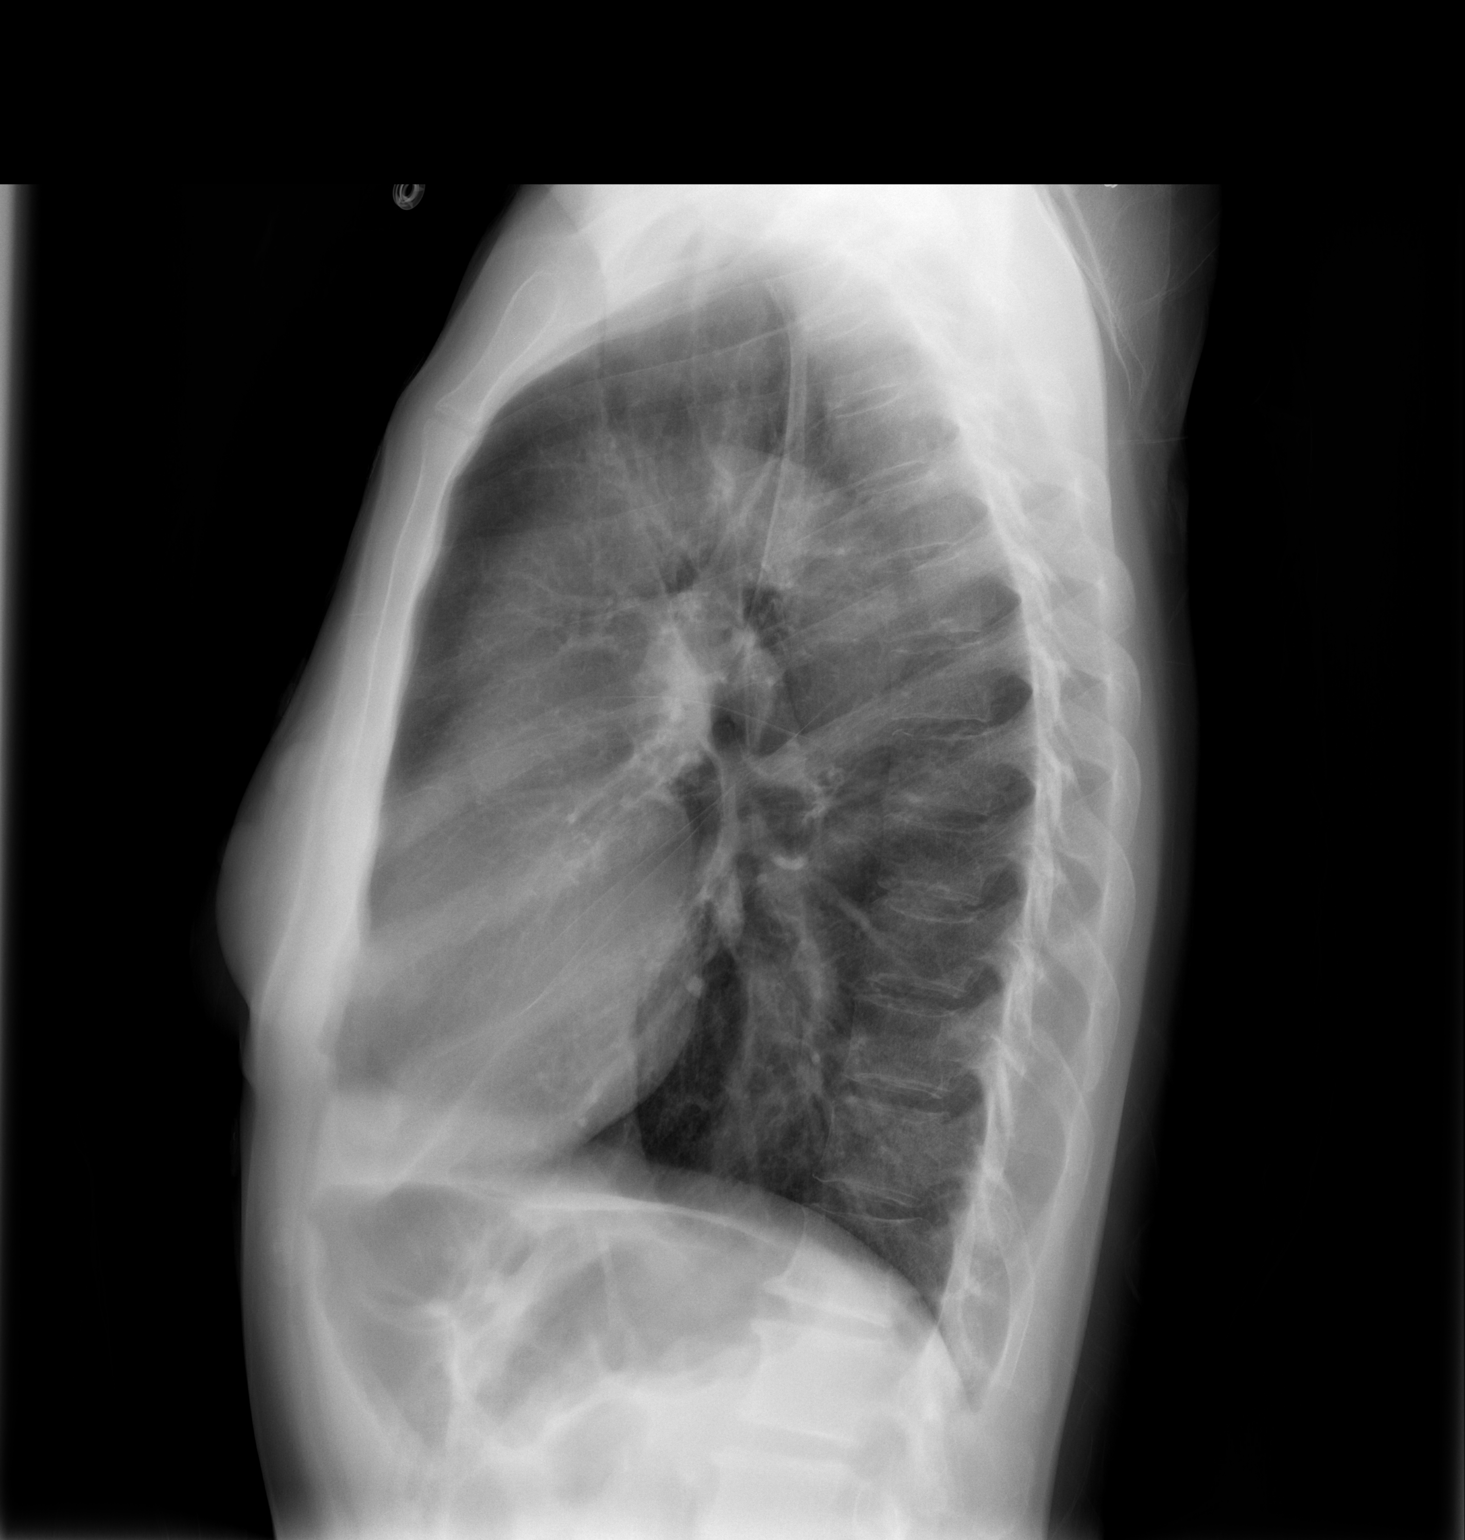

[2 of 2 positions shown; findings below may reference images not displayed]

FINDINGS: Mild hyperinflation with interstitial thickening. Midline
trachea.  Normal heart size and mediastinal contours. No pleural
effusion or pneumothorax.  Clear lungs.
IMPRESSION: COPD/chronic bronchitis. No acute superimposed process.

## 2013-08-11 IMAGING — CT CT ANGIO CHEST
2 of 6 series · 18 of 46 positions shown · IV contrast (omnipaque)
Comparison: Chest radiograph - earlier same day;

CLINICAL DATA: Fever, abdominal pain, chest pain, palpitations

CT ANGIOGRAPHY CHEST
TECHNIQUE: Multidetector CT imaging of the chest using the
standard protocol during bolus administration of intravenous
contrast. Multiplanar reconstructed images including MIPs were
obtained and reviewed to evaluate the vascular anatomy.
Contrast: 100mL OMNIPAQUE IOHEXOL 350 MG/ML SOLN

[Series 6: pulm embolism 1.0 b25f thin · axial · 0.59mm/px · z∈[-380,-74]mm · 15 of 335 slices shown]
[im 15/335  lung]
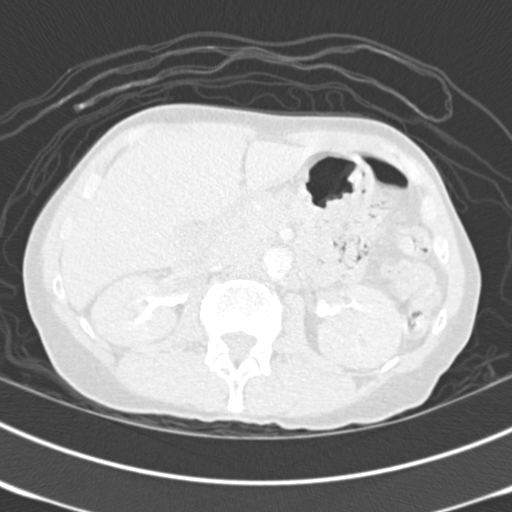
[im 44/335  soft-tissue]
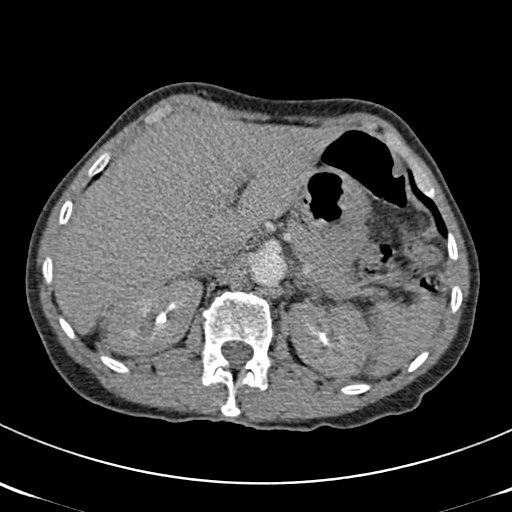
[im 59/335  lung]
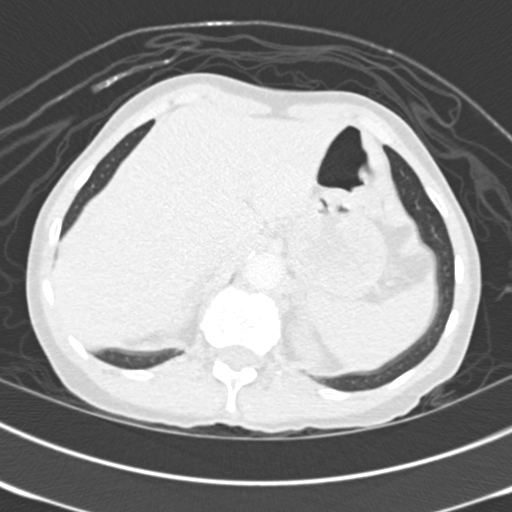
[im 88/335  soft-tissue]
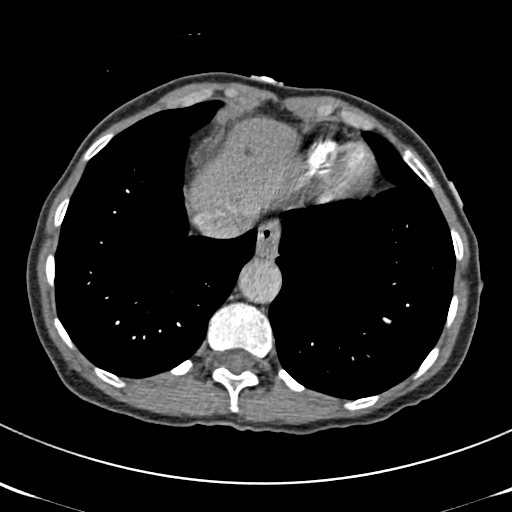
[im 102/335  lung]
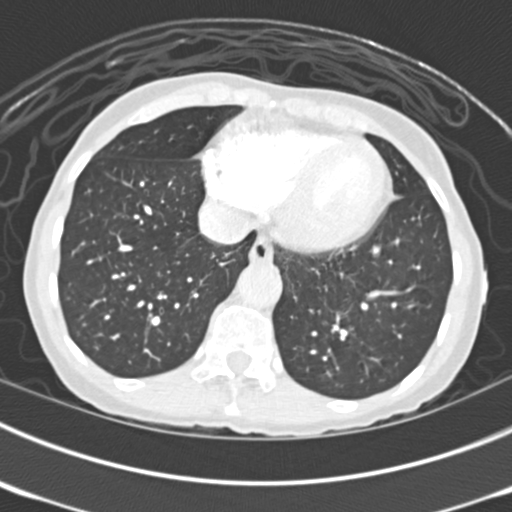
[im 131/335  soft-tissue]
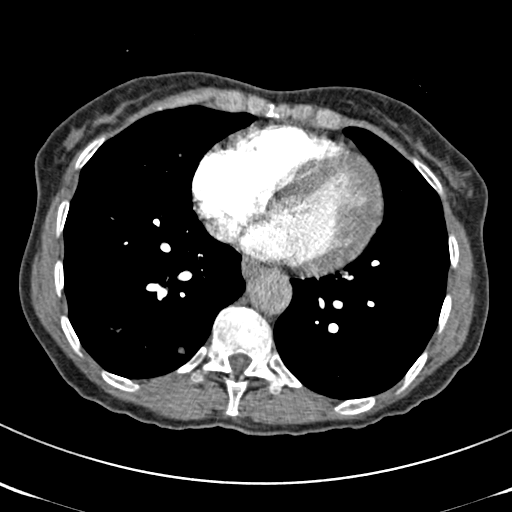
[im 146/335  lung]
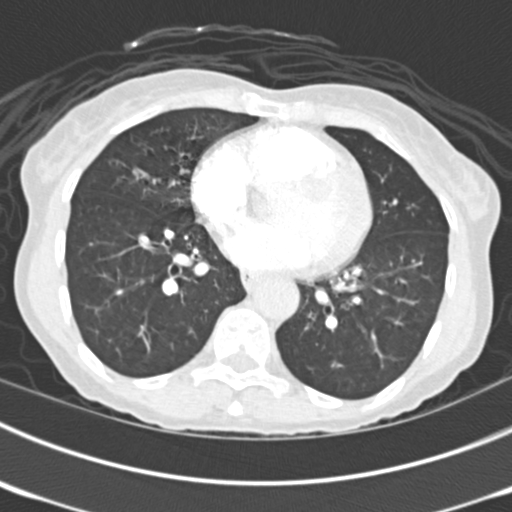
[im 175/335  soft-tissue]
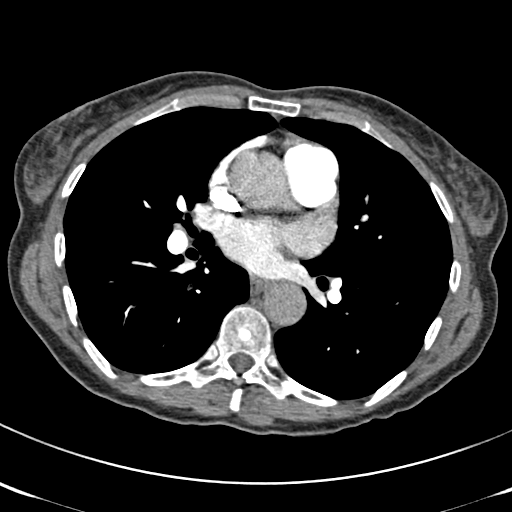
[im 189/335  lung]
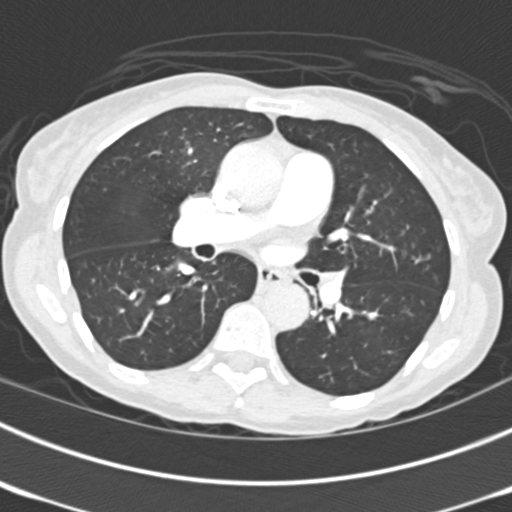
[im 204/335  soft-tissue]
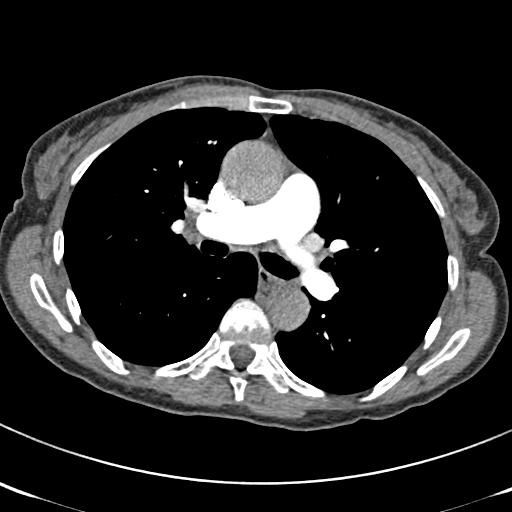
[im 233/335  lung]
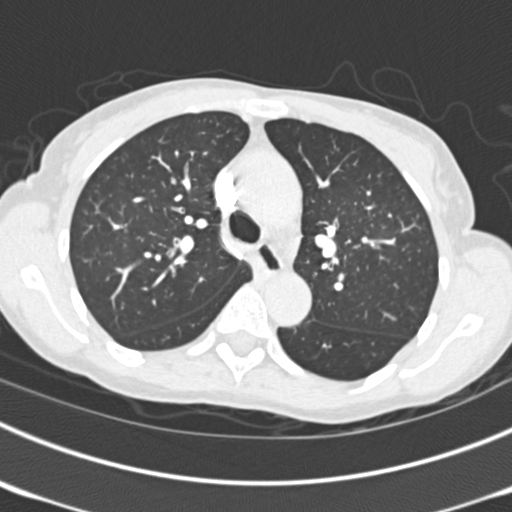
[im 247/335  soft-tissue]
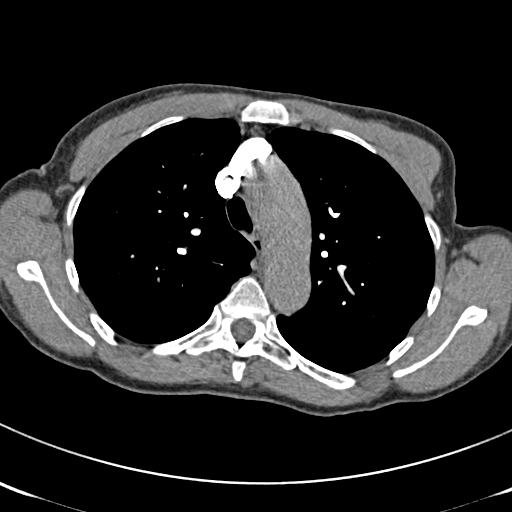
[im 276/335  lung]
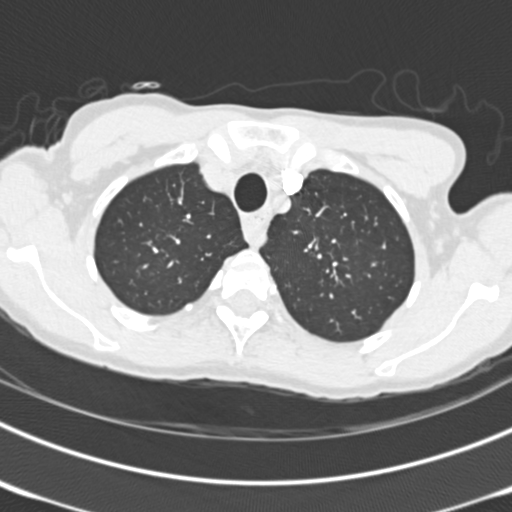
[im 291/335  soft-tissue]
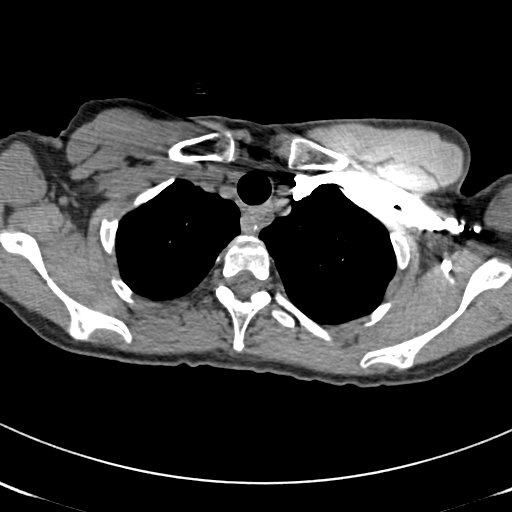
[im 320/335  lung]
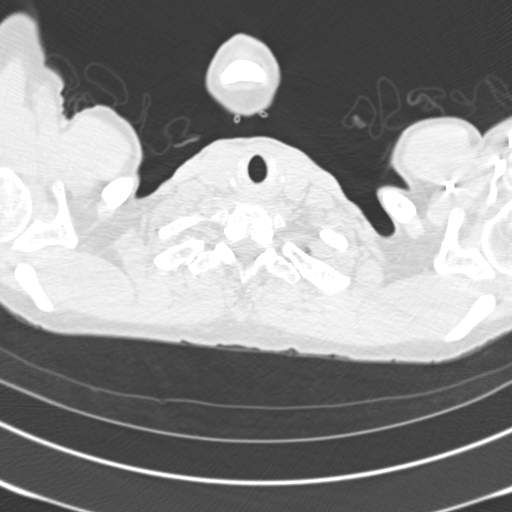

[Series 602: coronal mpr · coronal · 0.65mm/px · 3 of 72 slices shown]
[im 18/72  soft-tissue]
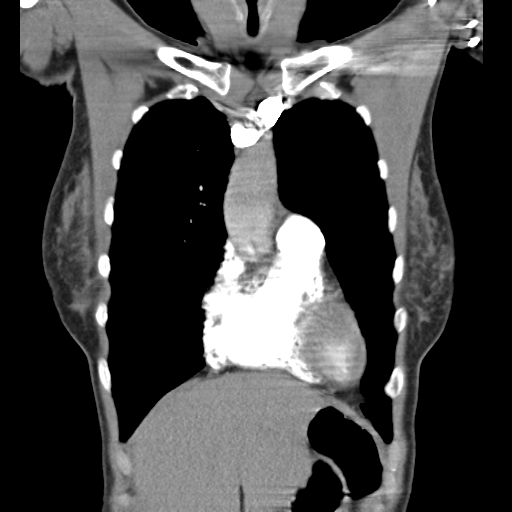
[im 36/72  soft-tissue]
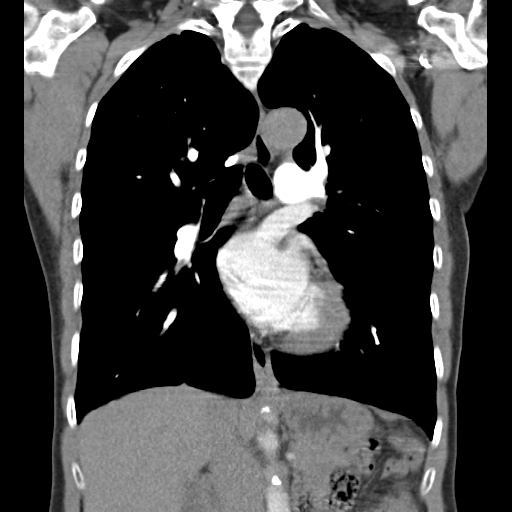
[im 54/72  soft-tissue]
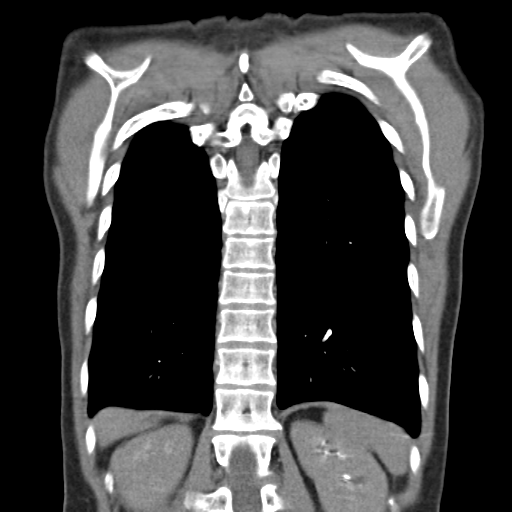

[18 of 46 positions shown; findings below may reference images not displayed]

chest CT -
03/04/2011; 03/24/2010

Vascular Findings:

There is adequate opacification of the pulmonary arterial tree with
the main pulmonary artery measuring 400 and 25 HU.  There is no
discrete filling defect within the pulmonary arterial tree to
suggest pulmonary embolism.  The caliber of the main pulmonary
artery is normal.

Normal heart size.  No pericardial effusion.

Although this examination was not tailored for evaluation of the
thoracic aorta there is no definite evidence of thoracic aortic
dissection.

Minimal ectasia of the ascending thoracic aorta measuring
approximately 3.7 cm as measured at the level of the main pulmonary
artery (image 17, series 604), previously, approximately 3.4 cm (as
measured on the [DATE] examination).

Normal caliber of the aortic arch measuring approximately 3 cm in
greatest oblique sagittal dimension (image 52, series [DATE]) as
measured just distal to the takeoff of the left subclavian artery.
Incidental note is again made of an aberrant right subclavian
artery.

Normal caliber of the distal aspect of the descending thoracic
aorta measuring approximately 2.6 cm in greatest oblique axial
dimension at the level of the diaphragmatic hiatus (image 88,
series four).

Nonvascular findings:

Evaluation of the pulmonary parenchyma is minimally degraded
secondary to patient respiratory artifact.

Grossly unchanged 6 mm nodule within the right lower lobe, stable
since the [DATE] examination and best favored to be a benign
etiology.  Smaller 2 mm nodule with the right upper lobe (image 29)
is also unchanged. No new discrete pulmonary nodules.  No focal
airspace opacities.  No pleural effusion or pneumothorax.  No
mediastinal, hilar or axillary lymphadenopathy.

Incidental imaging of the upper abdomen demonstrates multiple sub
centimeter hypoattenuating hepatic lesions in the dome of the right
and left lobe of the liver which are grossly unchanged and remain
too small to accurately characterize the favored to represent
hepatic cysts.

No acute or aggressive osseous abnormalities.
IMPRESSION: 1.   No acute cardiopulmonary disease.  Specifically, no evidence
of pulmonary embolism.

2.  Unchanged 6 mm nodule within right lower lobe, stable since the
[DATE] examination and thus favored to be of benign etiology.

3.  Mild increased ectasia of the ascending thoracic aorta
measuring approximately 3.7 cm in greatest oblique axial dimension,
previously 3.4 cm on the [DATE] examination.

3.  Aberrant right subclavian artery.

## 2013-08-11 IMAGING — CR DG CHEST 2V
2 series · 2 of 2 positions shown · non-contrast
Comparison: 04/24/2012; 04/07/2012; 02/23/2012; chest CT -
03/04/2011

CLINICAL DATA: Wheezing, history of asthma, chest pain

CHEST - 2 VIEW

[w chest pa]
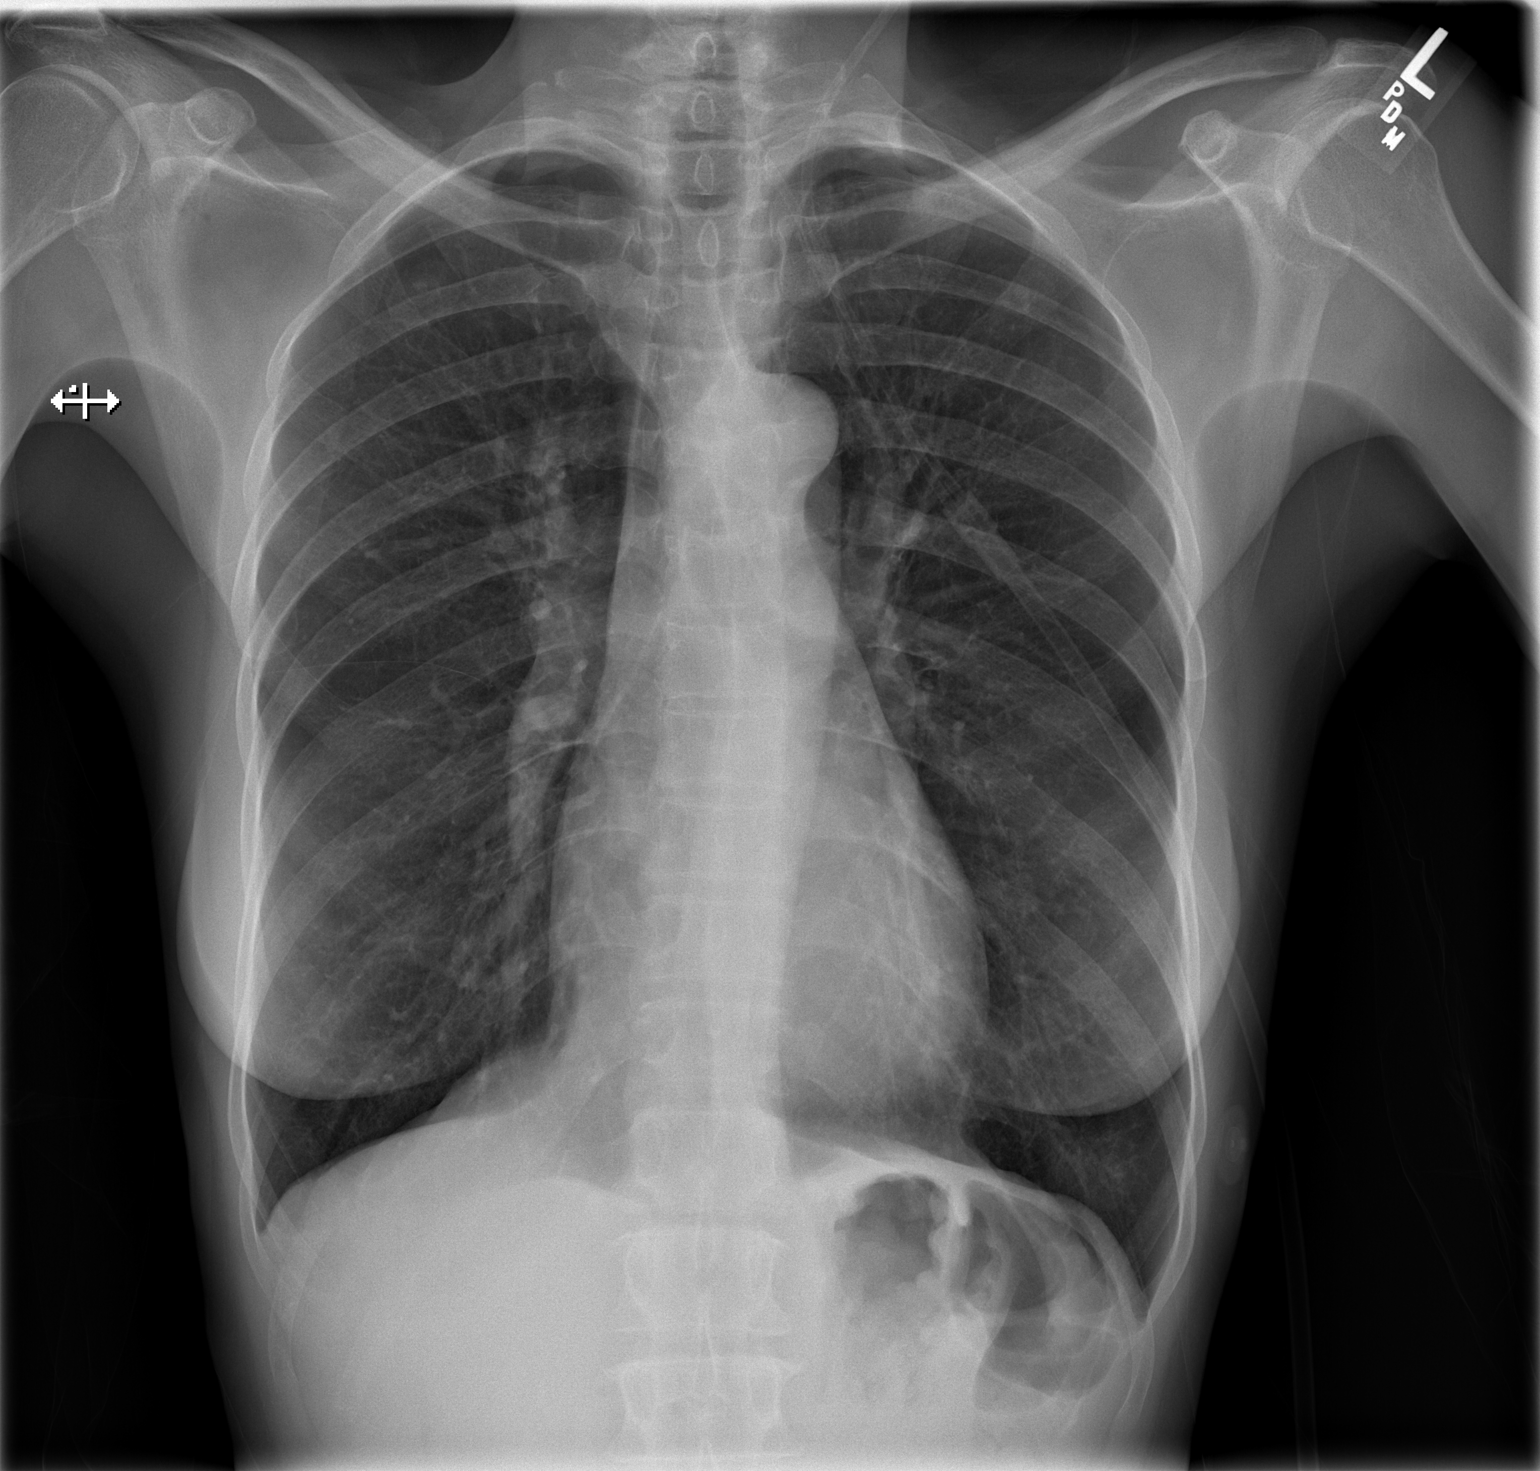

[w chest lat]
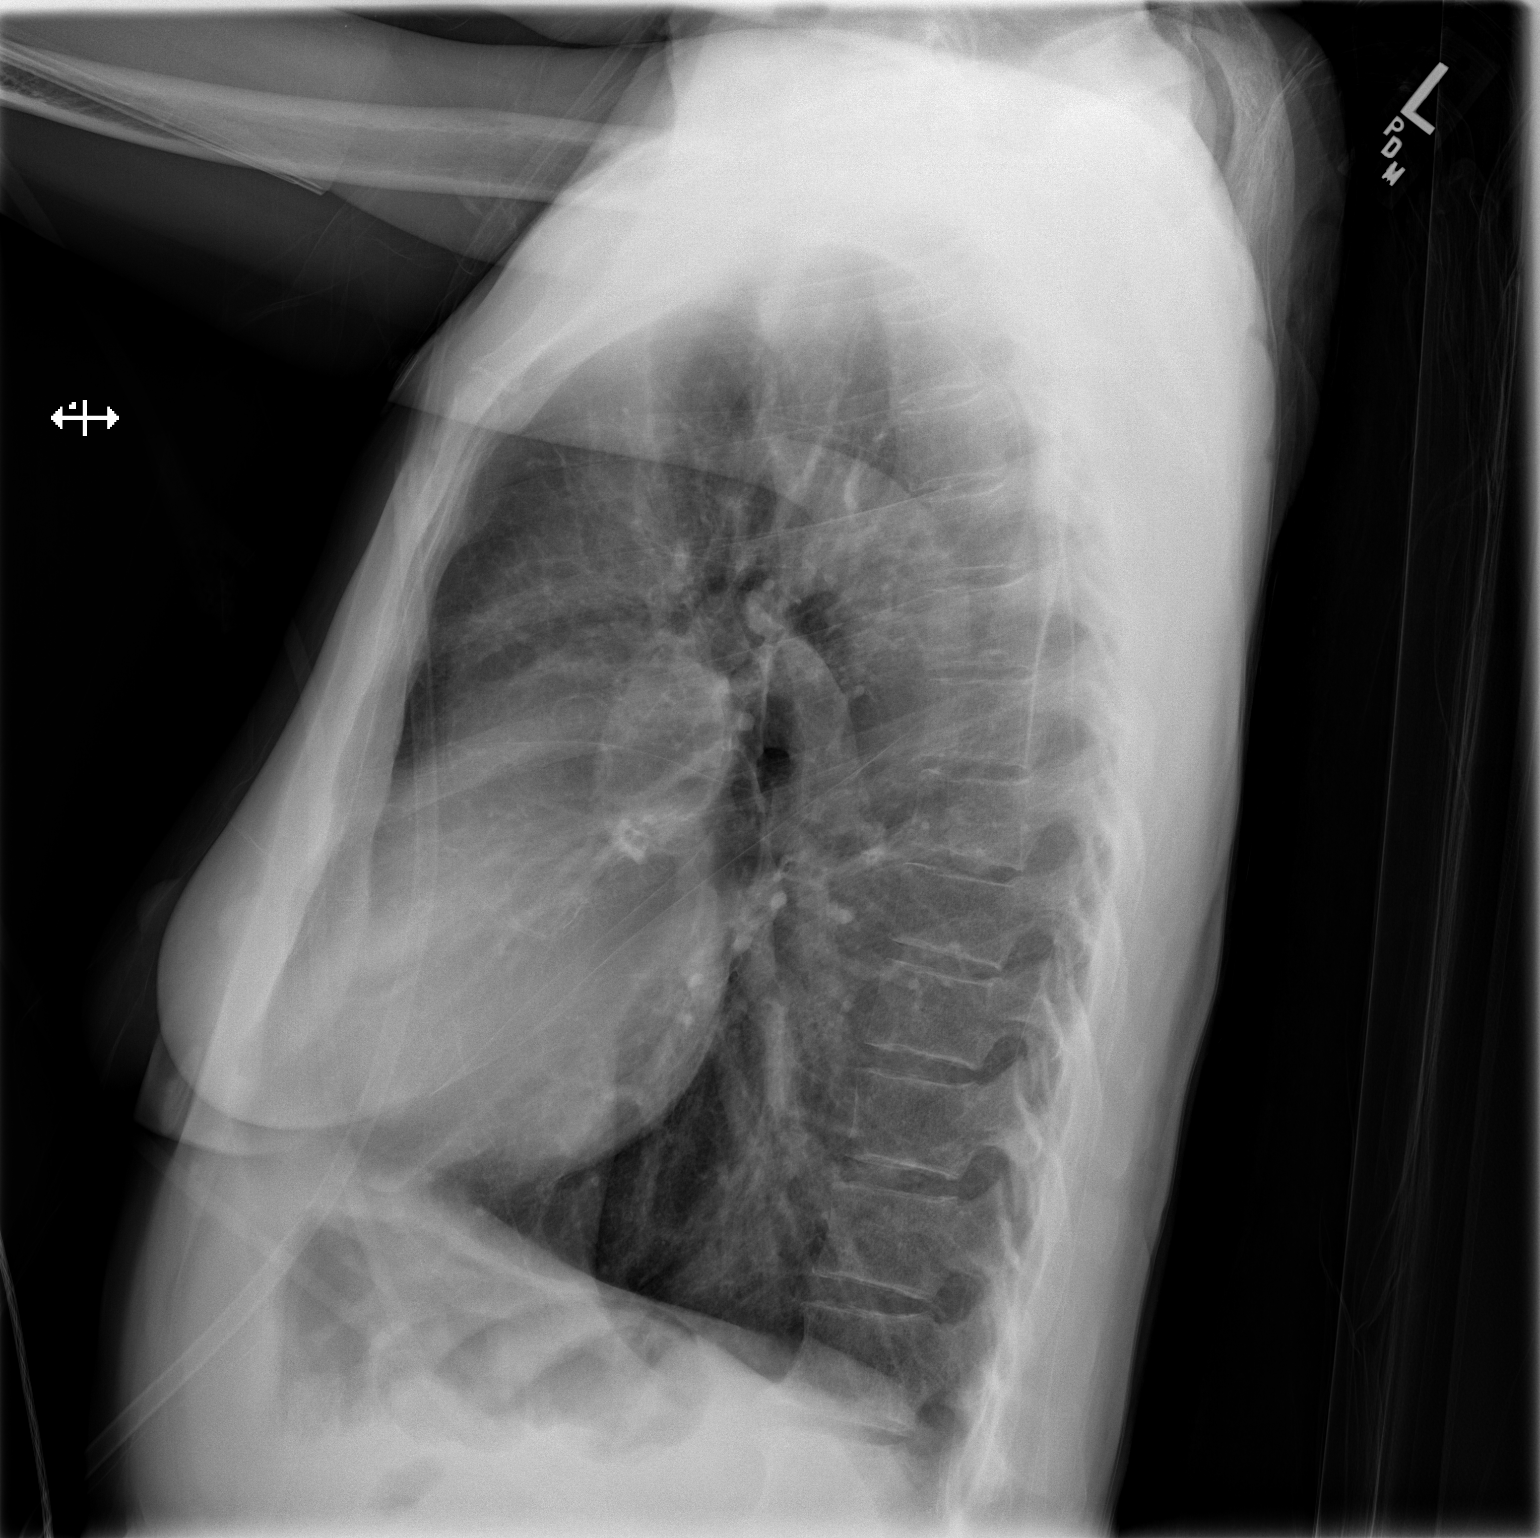

[2 of 2 positions shown; findings below may reference images not displayed]

FINDINGS: Unchanged cardiac silhouette and mediastinal contours.
The lungs remain hyperexpanded with flattening of bilateral
hemidiaphragms. Electrical cardiac monitor stickers overlying the
bilateral lung apices and left lateral chest wall.  No focal
airspace opacities.  No pleural effusion or pneumothorax.
Unchanged bones.
IMPRESSION: Hyperexpanded lungs without acute cardiopulmonary disease.

## 2013-12-20 IMAGING — CR DG CHEST 2V
2 series · 2 of 2 positions shown · non-contrast
Comparison: Chest x-ray 06/10/2012.

CLINICAL DATA: Chest pain and shortness of breath.

CHEST - 2 VIEW

[w chest pa]
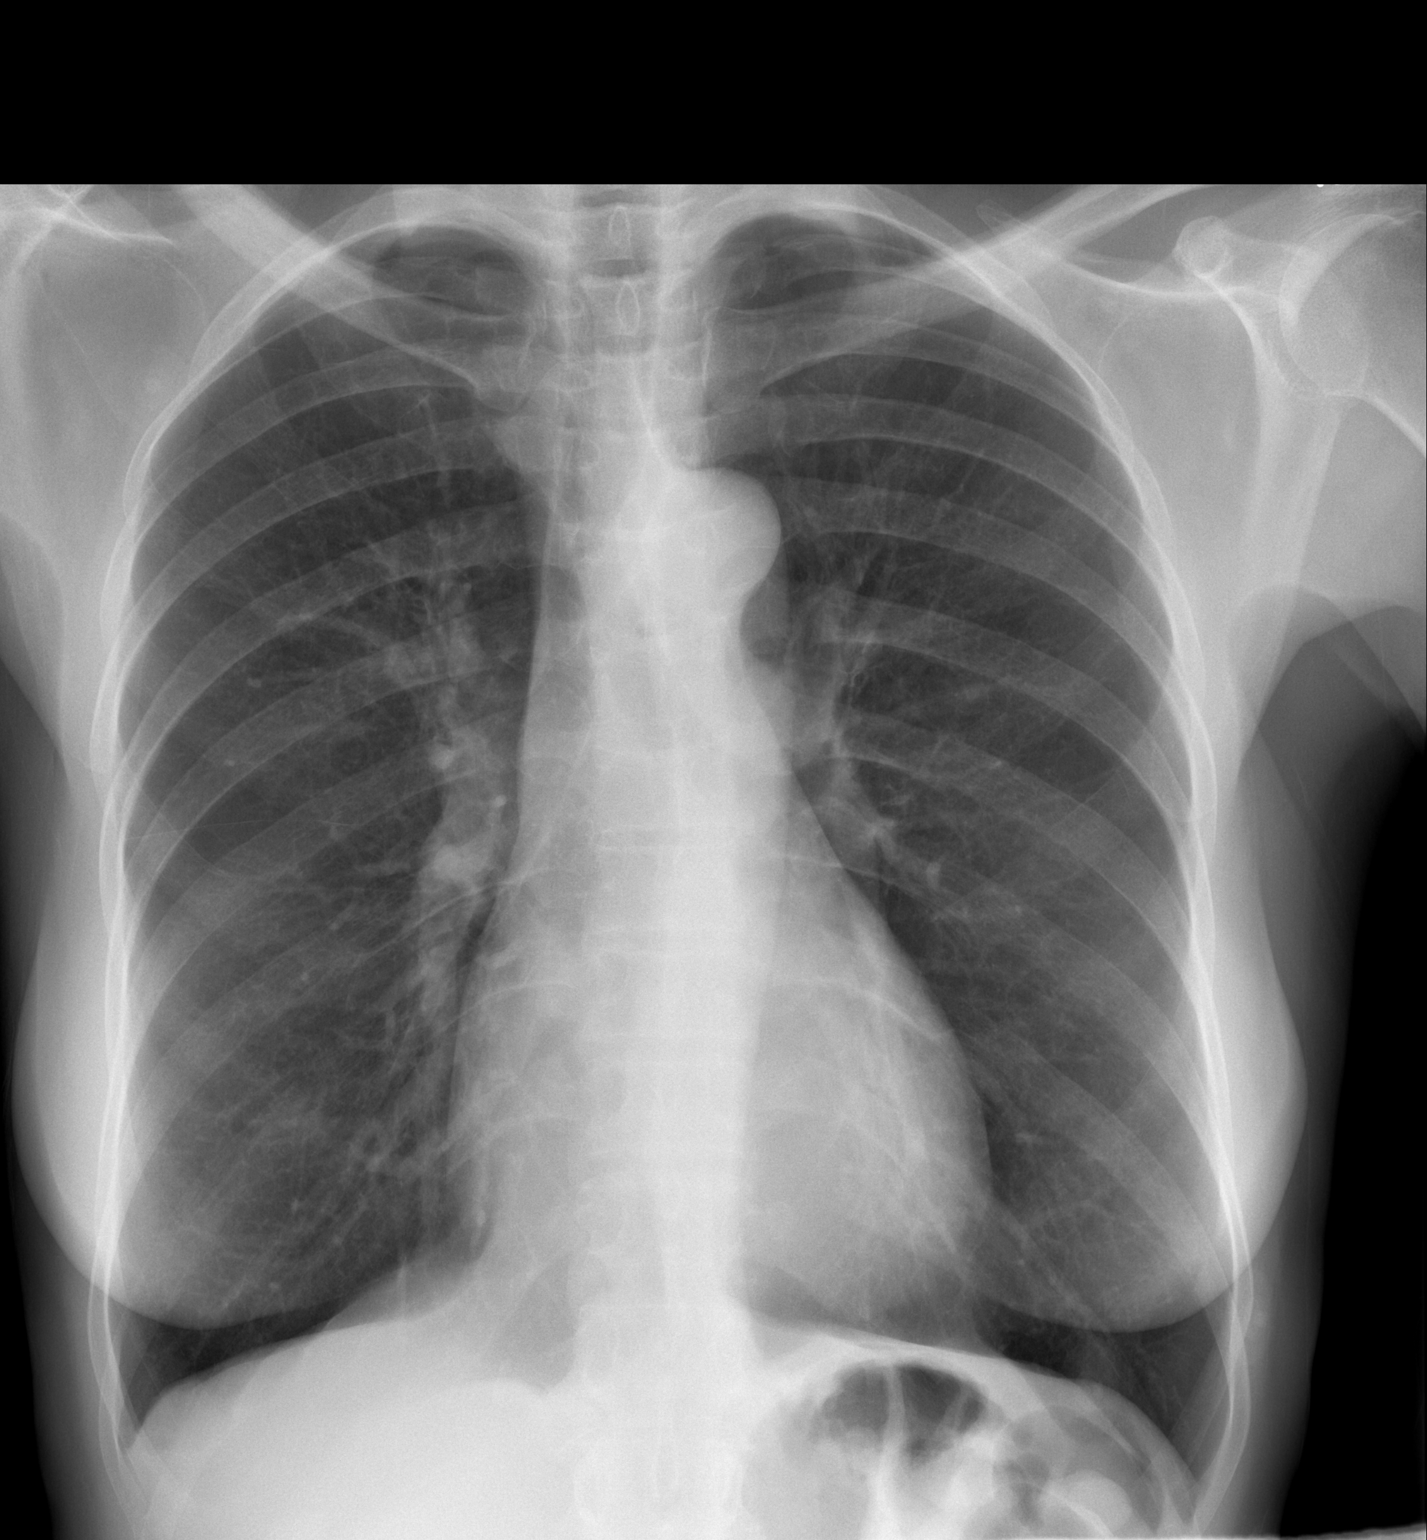

[w chest lat]
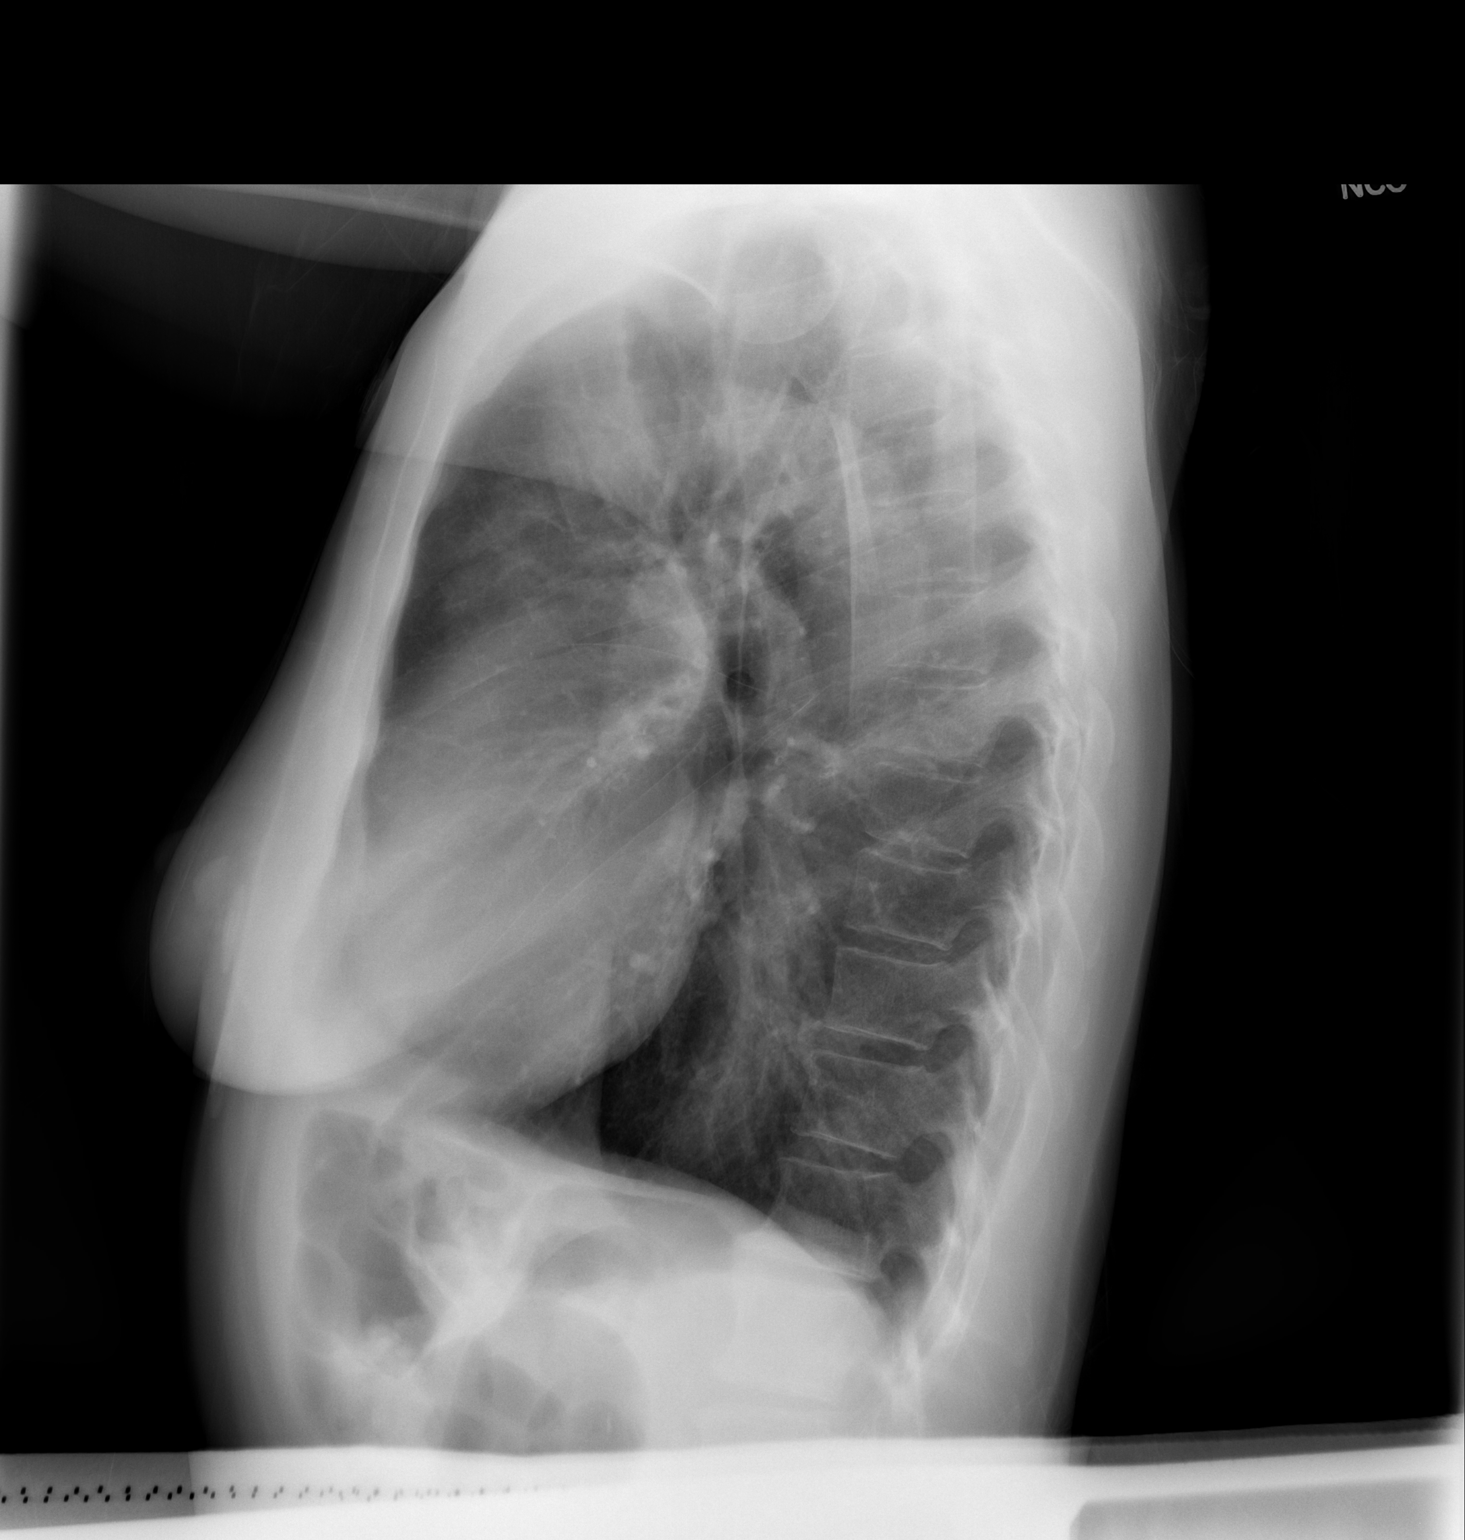

[2 of 2 positions shown; findings below may reference images not displayed]

FINDINGS: Lungs appear hyperexpanded with flattening of the
hemidiaphragms, increased retrosternal air space and pruning of the
pulmonary vasculature in the periphery, suggestive of underlying
COPD.  No acute consolidative airspace disease.  No pleural
effusions.  No evidence of pulmonary edema.  Heart size is normal.
Atherosclerosis in the thoracic aorta.
IMPRESSION: 1.  Chronic changes suggestive of mild COPD redemonstrated, as
above, without radiographic evidence of acute cardiopulmonary
disease.
2.  Atherosclerosis.

## 2014-01-30 ENCOUNTER — Emergency Department (HOSPITAL_COMMUNITY): Payer: Medicare Other

## 2014-01-30 ENCOUNTER — Encounter (HOSPITAL_COMMUNITY): Payer: Self-pay | Admitting: Emergency Medicine

## 2014-01-30 ENCOUNTER — Emergency Department (HOSPITAL_COMMUNITY)
Admission: EM | Admit: 2014-01-30 | Discharge: 2014-01-30 | Disposition: A | Discharge status: Home / Self Care | Payer: Medicare Other | Attending: Emergency Medicine | Admitting: Emergency Medicine

## 2014-01-30 DIAGNOSIS — Z79899 Other long term (current) drug therapy: Secondary | ICD-10-CM | POA: Insufficient documentation

## 2014-01-30 DIAGNOSIS — I4891 Unspecified atrial fibrillation: Secondary | ICD-10-CM | POA: Insufficient documentation

## 2014-01-30 DIAGNOSIS — J45901 Unspecified asthma with (acute) exacerbation: Secondary | ICD-10-CM | POA: Insufficient documentation

## 2014-01-30 DIAGNOSIS — I10 Essential (primary) hypertension: Secondary | ICD-10-CM | POA: Insufficient documentation

## 2014-01-30 DIAGNOSIS — IMO0002 Reserved for concepts with insufficient information to code with codable children: Secondary | ICD-10-CM | POA: Insufficient documentation

## 2014-01-30 DIAGNOSIS — Z8719 Personal history of other diseases of the digestive system: Secondary | ICD-10-CM | POA: Insufficient documentation

## 2014-01-30 DIAGNOSIS — R1013 Epigastric pain: Secondary | ICD-10-CM | POA: Insufficient documentation

## 2014-01-30 DIAGNOSIS — Z87891 Personal history of nicotine dependence: Secondary | ICD-10-CM | POA: Insufficient documentation

## 2014-01-30 LAB — CBC
HEMATOCRIT: 40 % (ref 36.0–46.0)
HEMOGLOBIN: 13.8 g/dL (ref 12.0–15.0)
MCH: 27.3 pg (ref 26.0–34.0)
MCHC: 34.5 g/dL (ref 30.0–36.0)
MCV: 79.1 fL (ref 78.0–100.0)
Platelets: 216 10*3/uL (ref 150–400)
RBC: 5.06 MIL/uL (ref 3.87–5.11)
RDW: 15.1 % (ref 11.5–15.5)
WBC: 3.6 10*3/uL — ABNORMAL LOW (ref 4.0–10.5)

## 2014-01-30 LAB — LIPASE, BLOOD: Lipase: 23 U/L (ref 11–59)

## 2014-01-30 LAB — BASIC METABOLIC PANEL
BUN: 12 mg/dL (ref 6–23)
CHLORIDE: 100 meq/L (ref 96–112)
CO2: 24 mEq/L (ref 19–32)
Calcium: 10.8 mg/dL — ABNORMAL HIGH (ref 8.4–10.5)
Creatinine, Ser: 0.7 mg/dL (ref 0.50–1.10)
GFR, EST NON AFRICAN AMERICAN: 89 mL/min — AB (ref >90)
GLUCOSE: 112 mg/dL — AB (ref 70–99)
POTASSIUM: 3.7 meq/L (ref 3.7–5.3)
SODIUM: 142 meq/L (ref 137–147)

## 2014-01-30 LAB — I-STAT TROPONIN, ED: TROPONIN I, POC: 0.02 ng/mL (ref 0.00–0.08)

## 2014-01-30 MED ORDER — FAMOTIDINE 20 MG PO TABS: 20.0000 mg | ORAL_TABLET | Freq: Two times a day (BID) | ORAL | Status: DC

## 2014-01-30 MED ORDER — PREDNISONE 20 MG PO TABS
60.0000 mg | ORAL_TABLET | ORAL | Status: AC
  Administered 2014-01-30: 60 mg via ORAL
  Filled 2014-01-30: qty 3

## 2014-01-30 MED ORDER — HYDROMORPHONE HCL PF 1 MG/ML IJ SOLN
0.5000 mg | Freq: Once | INTRAMUSCULAR | Status: AC
  Administered 2014-01-30: 0.5 mg via INTRAMUSCULAR
  Filled 2014-01-30: qty 1

## 2014-01-30 MED ORDER — PREDNISONE 20 MG PO TABS: 40.0000 mg | ORAL_TABLET | Freq: Every day | ORAL | Status: DC

## 2014-01-30 MED ORDER — ALBUTEROL SULFATE HFA 108 (90 BASE) MCG/ACT IN AERS
2.0000 | INHALATION_SPRAY | Freq: Four times a day (QID) | RESPIRATORY_TRACT | Status: DC
  Administered 2014-01-30: 2 via RESPIRATORY_TRACT
  Filled 2014-01-30: qty 6.7

## 2014-01-30 MED ORDER — IPRATROPIUM-ALBUTEROL 0.5-2.5 (3) MG/3ML IN SOLN
3.0000 mL | Freq: Once | RESPIRATORY_TRACT | Status: AC
  Administered 2014-01-30: 3 mL via RESPIRATORY_TRACT
  Filled 2014-01-30: qty 3

## 2014-01-30 MED ORDER — GI COCKTAIL ~~LOC~~
30.0000 mL | Freq: Once | ORAL | Status: AC
  Administered 2014-01-30: 30 mL via ORAL
  Filled 2014-01-30: qty 30

## 2014-01-30 NOTE — ED Notes (Signed)
Patient placed on cardiac monitoring equipment by this EMT

## 2014-01-30 NOTE — ED Notes (Signed)
Per pt sts chest pain and coughing x 3 days. sts she feels like her heart is pumping real fast. sts she took advair and albuterol.

## 2014-01-30 NOTE — Discharge Instructions (Signed)
As discussed, it is important that you followup with your physician for appropriate ongoing care.  For the next 2 days, please use your albuterol inhaler every 4 hours.  Please be sure to take all the medication as directed, return here if you develop new, or concerning changes in your condition.

## 2014-01-30 NOTE — ED Provider Notes (Signed)
CSN: [id number]     Arrival date & time 01/30/14  1758 History   First MD Initiated Contact with Patient 01/30/14 1814     Chief Complaint  Patient presents with  . Chest Pain  . Asthma     (Consider location/radiation/quality/duration/timing/severity/associated sxs/prior Treatment) HPI  Patient presents in one week of cough, chest pain. Pain is actually in the epigastrium, sternum, as burning, sharp. Pain is worse with coughing. There is no associated dyspnea, wheezing. No clear precipitant, and since onset symptoms have been persistent in spite of using home medications, including albuterol. No concurrent fevers, chills, syncope, vomiting, diarrhea.   Past Medical History  Diagnosis Date  . Asthma   . Hypertension   . GERD (gastroesophageal reflux disease)   . A-fib    Past Surgical History  Procedure Laterality Date  . Tubal ligation     Family History  Problem Relation Age of Onset  . Cancer Mother   . Cancer Father   . Heart attack Mother 29   History  Substance Use Topics  . Smoking status: Former Smoker    Types: Cigarettes    Quit date: 11/07/2011  . Smokeless tobacco: Never Used  . Alcohol Use: No   OB History   Grav Para Term Preterm Abortions TAB SAB Ect Mult Living                 Review of Systems  Constitutional:       Per HPI, otherwise negative  HENT:       Per HPI, otherwise negative  Respiratory:       Per HPI, otherwise negative  Cardiovascular:       Per HPI, otherwise negative  Gastrointestinal: Negative for vomiting.  Endocrine:       Negative aside from HPI  Genitourinary:       Neg aside from HPI   Musculoskeletal:       Per HPI, otherwise negative  Skin: Negative.   Neurological: Negative for syncope.      Allergies  Pantoprazole and Celecoxib  Home Medications   Prior to Admission medications   Medication Sig Start Date End Date Taking? Authorizing Provider  albuterol (PROVENTIL HFA;VENTOLIN HFA) 108 (90 BASE)  MCG/ACT inhaler Inhale 2 puffs into the lungs every 4 (four) hours as needed for wheezing or shortness of breath. 12/06/12  Yes Montine Circle, PA-C  Fluticasone-Salmeterol (ADVAIR) 250-50 MCG/DOSE AEPB Inhale 1 puff into the lungs 2 (two) times daily.   Yes Historical Provider, MD  potassium chloride SA (K-DUR,KLOR-CON) 20 MEQ tablet Take 1 tablet (20 mEq total) by mouth 2 (two) times daily. 01/23/13  Yes Montine Circle, PA-C  tiotropium (SPIRIVA HANDIHALER) 18 MCG inhalation capsule Place 1 capsule (18 mcg total) into inhaler and inhale daily. 06/22/13  Yes Leota Jacobsen, MD   BP 168/106  Pulse 72  Temp(Src) 98.2 F (36.8 C) (Oral)  Resp 20  SpO2 98% Physical Exam  Nursing note and vitals reviewed. Constitutional: She is oriented to person, place, and time. She appears well-developed and well-nourished. No distress.  HENT:  Head: Normocephalic and atraumatic.  Eyes: Conjunctivae and EOM are normal.  Cardiovascular: Normal rate and regular rhythm.   Pulmonary/Chest: Effort normal and breath sounds normal. No stridor. No respiratory distress.  Abdominal: She exhibits no distension.  Musculoskeletal: She exhibits no edema.  Neurological: She is alert and oriented to person, place, and time. No cranial nerve deficit.  Skin: Skin is warm and dry.  Psychiatric: She has  a normal mood and affect.    ED Course  Procedures (including critical care time) Labs Review Labs Reviewed  BASIC METABOLIC PANEL - Abnormal; Notable for the following:    Glucose, Bld 112 (*)    Calcium 10.8 (*)    GFR calc non Af Amer 89 (*)    All other components within normal limits  CBC - Abnormal; Notable for the following:    WBC 3.6 (*)    All other components within normal limits  LIPASE, BLOOD  I-STAT TROPOININ, ED    Imaging Review Dg Chest 2 View (if Patient Has Fever And/or Copd)  01/30/2014   CLINICAL DATA:  Shortness of breath and chest pain  EXAM: CHEST  2 VIEW  COMPARISON:  February 15, 2013   FINDINGS: There is underlying emphysematous change. There is central peribronchial thickening, stable. There is no edema or consolidation. The heart size is normal. Pulmonary vascularity is within normal limits. No adenopathy. No bone lesions.  IMPRESSION: Underlying emphysema. There is central bronchiolitis, stable. No edema or consolidation.  Comment:  There is a nipple shadow on the left.   Electronically Signed   By: Lowella Grip M.D.   On: 01/30/2014 15:01     EKG Interpretation   Date/Time:  Wednesday January 30 2014 13:51:12 EDT Ventricular Rate:  85 PR Interval:  100 QRS Duration: 82 QT Interval:  358 QTC Calculation: 426 R Axis:   72 Text Interpretation:  Sinus rhythm with short PR Otherwise normal ECG  Sinus rhythm Artifact No significant change since last tracing Normal ECG  Confirmed by Carmin Muskrat  MD [phone]) on 01/30/2014 6:21:01 PM      MDM  Patient presents with ongoing cough, epigastric discomfort. Patient's evaluation is largely reassuring.  Patient improved here with albuterol therapy.  She was started on steroids, and discharged in stable condition to follow up with primary care.   Carmin Muskrat, MD 01/30/14 [phone]

## 2014-02-01 ENCOUNTER — Emergency Department (HOSPITAL_COMMUNITY)
Admission: EM | Admit: 2014-02-01 | Discharge: 2014-02-01 | Disposition: A | Discharge status: Home / Self Care | Payer: Medicare Other | Attending: Emergency Medicine | Admitting: Emergency Medicine

## 2014-02-01 ENCOUNTER — Emergency Department (HOSPITAL_COMMUNITY): Payer: Medicare Other

## 2014-02-01 ENCOUNTER — Encounter (HOSPITAL_COMMUNITY): Payer: Self-pay | Admitting: Emergency Medicine

## 2014-02-01 DIAGNOSIS — J45901 Unspecified asthma with (acute) exacerbation: Secondary | ICD-10-CM | POA: Insufficient documentation

## 2014-02-01 DIAGNOSIS — R059 Cough, unspecified: Secondary | ICD-10-CM | POA: Diagnosis present

## 2014-02-01 DIAGNOSIS — I10 Essential (primary) hypertension: Secondary | ICD-10-CM | POA: Insufficient documentation

## 2014-02-01 DIAGNOSIS — Z79899 Other long term (current) drug therapy: Secondary | ICD-10-CM | POA: Insufficient documentation

## 2014-02-01 DIAGNOSIS — R05 Cough: Secondary | ICD-10-CM | POA: Diagnosis present

## 2014-02-01 DIAGNOSIS — K219 Gastro-esophageal reflux disease without esophagitis: Secondary | ICD-10-CM | POA: Diagnosis not present

## 2014-02-01 DIAGNOSIS — Z87891 Personal history of nicotine dependence: Secondary | ICD-10-CM | POA: Diagnosis not present

## 2014-02-01 LAB — URINALYSIS, ROUTINE W REFLEX MICROSCOPIC
Glucose, UA: NEGATIVE mg/dL
Ketones, ur: NEGATIVE mg/dL
Leukocytes, UA: NEGATIVE
Nitrite: NEGATIVE
Protein, ur: 100 mg/dL — AB
SPECIFIC GRAVITY, URINE: 1.025 (ref 1.005–1.030)
Urobilinogen, UA: 0.2 mg/dL (ref 0.0–1.0)
pH: 6 (ref 5.0–8.0)

## 2014-02-01 LAB — CBC
HEMATOCRIT: 43.8 % (ref 36.0–46.0)
HEMOGLOBIN: 14.7 g/dL (ref 12.0–15.0)
MCH: 26.9 pg (ref 26.0–34.0)
MCHC: 33.6 g/dL (ref 30.0–36.0)
MCV: 80.2 fL (ref 78.0–100.0)
Platelets: 280 10*3/uL (ref 150–400)
RBC: 5.46 MIL/uL — ABNORMAL HIGH (ref 3.87–5.11)
RDW: 16.8 % — ABNORMAL HIGH (ref 11.5–15.5)
WBC: 4 10*3/uL (ref 4.0–10.5)

## 2014-02-01 LAB — COMPREHENSIVE METABOLIC PANEL
ALBUMIN: 4.3 g/dL (ref 3.5–5.2)
ALK PHOS: 87 U/L (ref 39–117)
ALT: 18 U/L (ref 0–35)
AST: 23 U/L (ref 0–37)
BUN: 13 mg/dL (ref 6–23)
CALCIUM: 10 mg/dL (ref 8.4–10.5)
CO2: 27 mEq/L (ref 19–32)
Chloride: 103 mEq/L (ref 96–112)
Creatinine, Ser: 0.74 mg/dL (ref 0.50–1.10)
GFR calc non Af Amer: 87 mL/min — ABNORMAL LOW (ref >90)
GLUCOSE: 113 mg/dL — AB (ref 70–99)
POTASSIUM: 3.7 meq/L (ref 3.7–5.3)
Sodium: 142 mEq/L (ref 137–147)
Total Bilirubin: 0.4 mg/dL (ref 0.3–1.2)
Total Protein: 7.9 g/dL (ref 6.0–8.3)

## 2014-02-01 LAB — URINE MICROSCOPIC-ADD ON

## 2014-02-01 LAB — LIPASE, BLOOD: Lipase: 17 U/L (ref 11–59)

## 2014-02-01 MED ORDER — IPRATROPIUM BROMIDE 0.02 % IN SOLN
0.5000 mg | Freq: Once | RESPIRATORY_TRACT | Status: AC
  Administered 2014-02-01: 0.5 mg via RESPIRATORY_TRACT
  Filled 2014-02-01: qty 2.5

## 2014-02-01 MED ORDER — PREDNISONE 20 MG PO TABS: 60.0000 mg | ORAL_TABLET | Freq: Every day | ORAL | Status: DC

## 2014-02-01 MED ORDER — BENZONATATE 100 MG PO CAPS: 100.0000 mg | ORAL_CAPSULE | Freq: Three times a day (TID) | ORAL | Status: DC

## 2014-02-01 MED ORDER — FAMOTIDINE 20 MG PO TABS: 20.0000 mg | ORAL_TABLET | Freq: Two times a day (BID) | ORAL | Status: DC

## 2014-02-01 MED ORDER — METHYLPREDNISOLONE SODIUM SUCC 125 MG IJ SOLR
125.0000 mg | Freq: Once | INTRAMUSCULAR | Status: AC
  Administered 2014-02-01: 125 mg via INTRAVENOUS
  Filled 2014-02-01: qty 2

## 2014-02-01 MED ORDER — ONDANSETRON HCL 4 MG/2ML IJ SOLN
4.0000 mg | Freq: Once | INTRAMUSCULAR | Status: AC
  Administered 2014-02-01: 4 mg via INTRAVENOUS
  Filled 2014-02-01: qty 2

## 2014-02-01 MED ORDER — ALBUTEROL SULFATE (2.5 MG/3ML) 0.083% IN NEBU
5.0000 mg | INHALATION_SOLUTION | Freq: Once | RESPIRATORY_TRACT | Status: AC
  Administered 2014-02-01: 5 mg via RESPIRATORY_TRACT
  Filled 2014-02-01: qty 6

## 2014-02-01 MED ORDER — ONDANSETRON HCL 4 MG PO TABS: 4.0000 mg | ORAL_TABLET | Freq: Four times a day (QID) | ORAL | Status: DC

## 2014-02-01 NOTE — ED Notes (Signed)
MD at bedside. 

## 2014-02-01 NOTE — ED Provider Notes (Signed)
CSN: [id number]     Arrival date & time 02/01/14  0730 History   First MD Initiated Contact with Patient 02/01/14 0741     Chief Complaint  Patient presents with  . Cough   Patient is a 63 y.o. female presenting with cough. The history is provided by the patient.  Cough Cough characteristics:  Non-productive Severity:  Severe Onset quality:  Gradual Duration:  1 week Timing:  Constant Progression:  Worsening Chronicity:  Recurrent Relieved by:  Nothing Ineffective treatments:  Beta-agonist inhaler Associated symptoms: shortness of breath and wheezing   Associated symptoms: no fever   Stomach also hurts with all of the coughing but no trouble with eating.  No vomiting or diarrhea. Patient was seen in the emergency room with similar symptoms 2 days ago. Patient was diagnosed with bronchitis gastritis. She was given prescriptions for steroids and antacids. The patient has not picked up the prescriptions yet.  Past Medical History  Diagnosis Date  . Asthma   . Hypertension   . GERD (gastroesophageal reflux disease)   . A-fib    Past Surgical History  Procedure Laterality Date  . Tubal ligation     Family History  Problem Relation Age of Onset  . Cancer Mother   . Cancer Father   . Heart attack Mother 46   History  Substance Use Topics  . Smoking status: Former Smoker    Types: Cigarettes    Quit date: 11/07/2011  . Smokeless tobacco: Never Used  . Alcohol Use: No   OB History   Grav Para Term Preterm Abortions TAB SAB Ect Mult Living                 Review of Systems  Constitutional: Negative for fever.  Respiratory: Positive for cough, shortness of breath and wheezing.   All other systems reviewed and are negative.     Allergies  Pantoprazole and Celecoxib  Home Medications   Prior to Admission medications   Medication Sig Start Date End Date Taking? Authorizing Provider  albuterol (PROVENTIL HFA;VENTOLIN HFA) 108 (90 BASE) MCG/ACT inhaler Inhale 2 puffs  into the lungs every 4 (four) hours as needed for wheezing or shortness of breath. 12/06/12   Montine Circle, PA-C  famotidine (PEPCID) 20 MG tablet Take 1 tablet (20 mg total) by mouth 2 (two) times daily. 01/30/14   Carmin Muskrat, MD  Fluticasone-Salmeterol (ADVAIR) 250-50 MCG/DOSE AEPB Inhale 1 puff into the lungs 2 (two) times daily.    Historical Provider, MD  potassium chloride SA (K-DUR,KLOR-CON) 20 MEQ tablet Take 1 tablet (20 mEq total) by mouth 2 (two) times daily. 01/23/13   Montine Circle, PA-C  predniSONE (DELTASONE) 20 MG tablet Take 2 tablets (40 mg total) by mouth daily with breakfast. 01/31/14 02/03/14  Carmin Muskrat, MD  tiotropium (SPIRIVA HANDIHALER) 18 MCG inhalation capsule Place 1 capsule (18 mcg total) into inhaler and inhale daily. 06/22/13   Leota Jacobsen, MD   BP 152/94  Pulse 81  Temp(Src) 97.8 F (36.6 C) (Oral)  Resp 18  SpO2 96% Physical Exam  Nursing note and vitals reviewed. Constitutional: She appears well-developed and well-nourished. No distress.  HENT:  Head: Normocephalic and atraumatic.  Right Ear: External ear normal.  Left Ear: External ear normal.  Eyes: Conjunctivae are normal. Right eye exhibits no discharge. Left eye exhibits no discharge. No scleral icterus.  Neck: Neck supple. No tracheal deviation present.  Cardiovascular: Normal rate, regular rhythm and intact distal pulses.   Pulmonary/Chest: Effort  normal. No stridor. No respiratory distress. She has wheezes. She has no rales.  Abdominal: Soft. Bowel sounds are normal. She exhibits no distension. There is generalized tenderness. There is no rigidity, no rebound and no guarding.  Musculoskeletal: She exhibits no edema and no tenderness.  Neurological: She is alert. She has normal strength. No cranial nerve deficit (no facial droop, extraocular movements intact, no slurred speech) or sensory deficit. She exhibits normal muscle tone. She displays no seizure activity. Coordination normal.   Skin: Skin is warm and dry. No rash noted.  Psychiatric: She has a normal mood and affect.    ED Course  Procedures (including critical care time) Labs Review Labs Reviewed  CBC - Abnormal; Notable for the following:    RBC 5.46 (*)    RDW 16.8 (*)    All other components within normal limits  URINALYSIS, ROUTINE W REFLEX MICROSCOPIC - Abnormal; Notable for the following:    APPearance CLOUDY (*)    Hgb urine dipstick TRACE (*)    Bilirubin Urine SMALL (*)    Protein, ur 100 (*)    All other components within normal limits  COMPREHENSIVE METABOLIC PANEL - Abnormal; Notable for the following:    Glucose, Bld 113 (*)    GFR calc non Af Amer 87 (*)    All other components within normal limits  LIPASE, BLOOD  URINE MICROSCOPIC-ADD ON    Imaging Review Dg Chest 2 View  02/01/2014   CLINICAL DATA:  Productive cough.  Shortness breath.  EXAM: CHEST  2 VIEW  COMPARISON:  Chest x-ray 01/30/2014.  FINDINGS: Lungs appear hyperexpanded with flattening of the hemidiaphragms, increased retrosternal air space and pruning of the pulmonary vasculature in the periphery, suggestive of underlying emphysema. No acute consolidative airspace disease. No pleural effusions. No evidence of pulmonary edema. Heart size and mediastinal contours are within normal limits. Atherosclerosis in the thoracic aorta.  IMPRESSION: 1. Emphysematous changes redemonstrated, without definite evidence of acute cardiopulmonary disease. 2. Atherosclerosis.   Electronically Signed   By: Vinnie Langton M.D.   On: 02/01/2014 08:18   Dg Chest 2 View (if Patient Has Fever And/or Copd)  01/30/2014   CLINICAL DATA:  Shortness of breath and chest pain  EXAM: CHEST  2 VIEW  COMPARISON:  February 15, 2013  FINDINGS: There is underlying emphysematous change. There is central peribronchial thickening, stable. There is no edema or consolidation. The heart size is normal. Pulmonary vascularity is within normal limits. No adenopathy. No bone  lesions.  IMPRESSION: Underlying emphysema. There is central bronchiolitis, stable. No edema or consolidation.  Comment:  There is a nipple shadow on the left.   Electronically Signed   By: Lowella Grip M.D.   On: 01/30/2014 15:01   Dg Abd 2 Views  02/01/2014   CLINICAL DATA:  Productive cough, shortness of breath for 1 week, history asthma, hypertension, former smoker.  EXAM: ABDOMEN - 2 VIEW  COMPARISON:  03/13/2010; correlation CT abdomen and pelvis 01/23/2013  FINDINGS: Lung bases hyperinflated but clear.  Nonobstructive bowel gas pattern.  No bowel dilatation, bowel wall thickening, or free intraperitoneal air.  Scattered gas and stool throughout colon.  Pelvic phleboliths.  Osseous demineralization.  No urinary tract calcification.  IMPRESSION: Nonobstructive bowel gas pattern.   Electronically Signed   By: Lavonia Dana M.D.   On: 02/01/2014 08:28     EKG Interpretation   Date/Time:  Friday February 01 2014 08:18:36 EDT Ventricular Rate:  86 PR Interval:  141 QRS  Duration: 89 QT Interval:  402 QTC Calculation: 481 R Axis:   52 Text Interpretation:  Sinus arrhythmia Biatrial enlargement Baseline  wander in lead(s) V5 No significant change since last tracing Confirmed by  Soleil Mas  MD-J, Poetry Cerro ([id number]) on 02/01/2014 8:27:00 AM     1055 feeling better. No wheezing noted on exam MDM   Final diagnoses:  Asthmatic bronchitis with exacerbation    Patient has improved with treatment. I suspect she is having recurrent episodes of asthmatic bronchitis. The patient is back in the ED after a recent visit her she never filled the prescriptions she was given. I discussed the importance of getting his prescriptions filled. Patient states she needs to go home at this point. She feels much better does not have any trouble with her breathing.  Regarding her abdominal pain, her exam is reassuring. Her laboratory tests are again unremarkable. She previously has had histories with gastroesophageal reflux.  Some of her symptoms may be associated with her coughing. I will give her prescriptions for Pepcid and Tessalon and prednisone   Dorie Rank, MD 02/01/14 1057

## 2014-02-01 NOTE — ED Notes (Signed)
Patient transported to X-ray 

## 2014-02-01 NOTE — ED Notes (Signed)
Pt made aware of need for urine specimen 

## 2014-02-01 NOTE — ED Notes (Signed)
Per pt, states cough for 1 week-history of asthma with no relief from inhaler-cough causing abdominal discomfort-feels week

## 2014-02-01 NOTE — Discharge Instructions (Signed)
Bronchospasm, Adult A bronchospasm is a spasm or tightening of the airways going into the lungs. During a bronchospasm breathing becomes more difficult because the airways get smaller. When this happens there can be coughing, a whistling sound when breathing (wheezing), and difficulty breathing. Bronchospasm is often associated with asthma, but not all patients who experience a bronchospasm have asthma. CAUSES  A bronchospasm is caused by inflammation or irritation of the airways. The inflammation or irritation may be triggered by:   Allergies (such as to animals, pollen, food, or mold). Allergens that cause bronchospasm may cause wheezing immediately after exposure or many hours later.   Infection. Viral infections are believed to be the most common cause of bronchospasm.   Exercise.   Irritants (such as pollution, cigarette smoke, strong odors, aerosol sprays, and paint fumes).   Weather changes. Winds increase molds and pollens in the air. Rain refreshes the air by washing irritants out. Cold air may cause inflammation.   Stress and emotional upset.  SIGNS AND SYMPTOMS   Wheezing.   Excessive nighttime coughing.   Frequent or severe coughing with a simple cold.   Chest tightness.   Shortness of breath.  DIAGNOSIS  Bronchospasm is usually diagnosed through a history and physical exam. Tests, such as chest X-rays, are sometimes done to look for other conditions. TREATMENT   Inhaled medicines can be given to open up your airways and help you breathe. The medicines can be given using either an inhaler or a nebulizer machine.  Corticosteroid medicines may be given for severe bronchospasm, usually when it is associated with asthma. HOME CARE INSTRUCTIONS   Always have a plan prepared for seeking medical care. Know when to call your health care provider and local emergency services (911 in the [place] Know where you can access local emergency care.  Only take medicines as  directed by your health care provider.  If you were prescribed an inhaler or nebulizer machine, ask your health care provider to explain how to use it correctly. Always use a spacer with your inhaler if you were given one.  It is necessary to remain calm during an attack. Try to relax and breathe more slowly.  Control your home environment in the following ways:   Change your heating and air conditioning filter at least once a month.   Limit your use of fireplaces and wood stoves.  Do not smoke and do not allow smoking in your home.   Avoid exposure to perfumes and fragrances.   Get rid of pests (such as roaches and mice) and their droppings.   Throw away plants if you see mold on them.   Keep your house clean and dust free.   Replace carpet with wood, tile, or vinyl flooring. Carpet can trap dander and dust.   Use allergy-proof pillows, mattress covers, and box spring covers.   Wash bed sheets and blankets every week in hot water and dry them in a dryer.   Use blankets that are made of polyester or cotton.   Wash hands frequently. SEEK MEDICAL CARE IF:   You have muscle aches.   You have chest pain.   The sputum changes from clear or white to yellow, green, gray, or bloody.   The sputum you cough up gets thicker.   There are problems that may be related to the medicine you are given, such as a rash, itching, swelling, or trouble breathing.  SEEK IMMEDIATE MEDICAL CARE IF:   You have worsening wheezing and coughing  even after taking your prescribed medicines.   You have increased difficulty breathing.   You develop severe chest pain. MAKE SURE YOU:   Understand these instructions.  Will watch your condition.  Will get help right away if you are not doing well or get worse. Document Released: 08/12/2003 Document Revised: 04/11/2013 Document Reviewed: 01/29/2013 [facility] Patient Information 2014 [place].

## 2014-02-08 IMAGING — CR DG CHEST 2V
2 series · 2 of 2 positions shown · non-contrast
Comparison: Chest radiograph performed 09/03/2012

CLINICAL DATA: Cough, congestion and shortness of breath.

CHEST - 2 VIEW

[w chest pa]
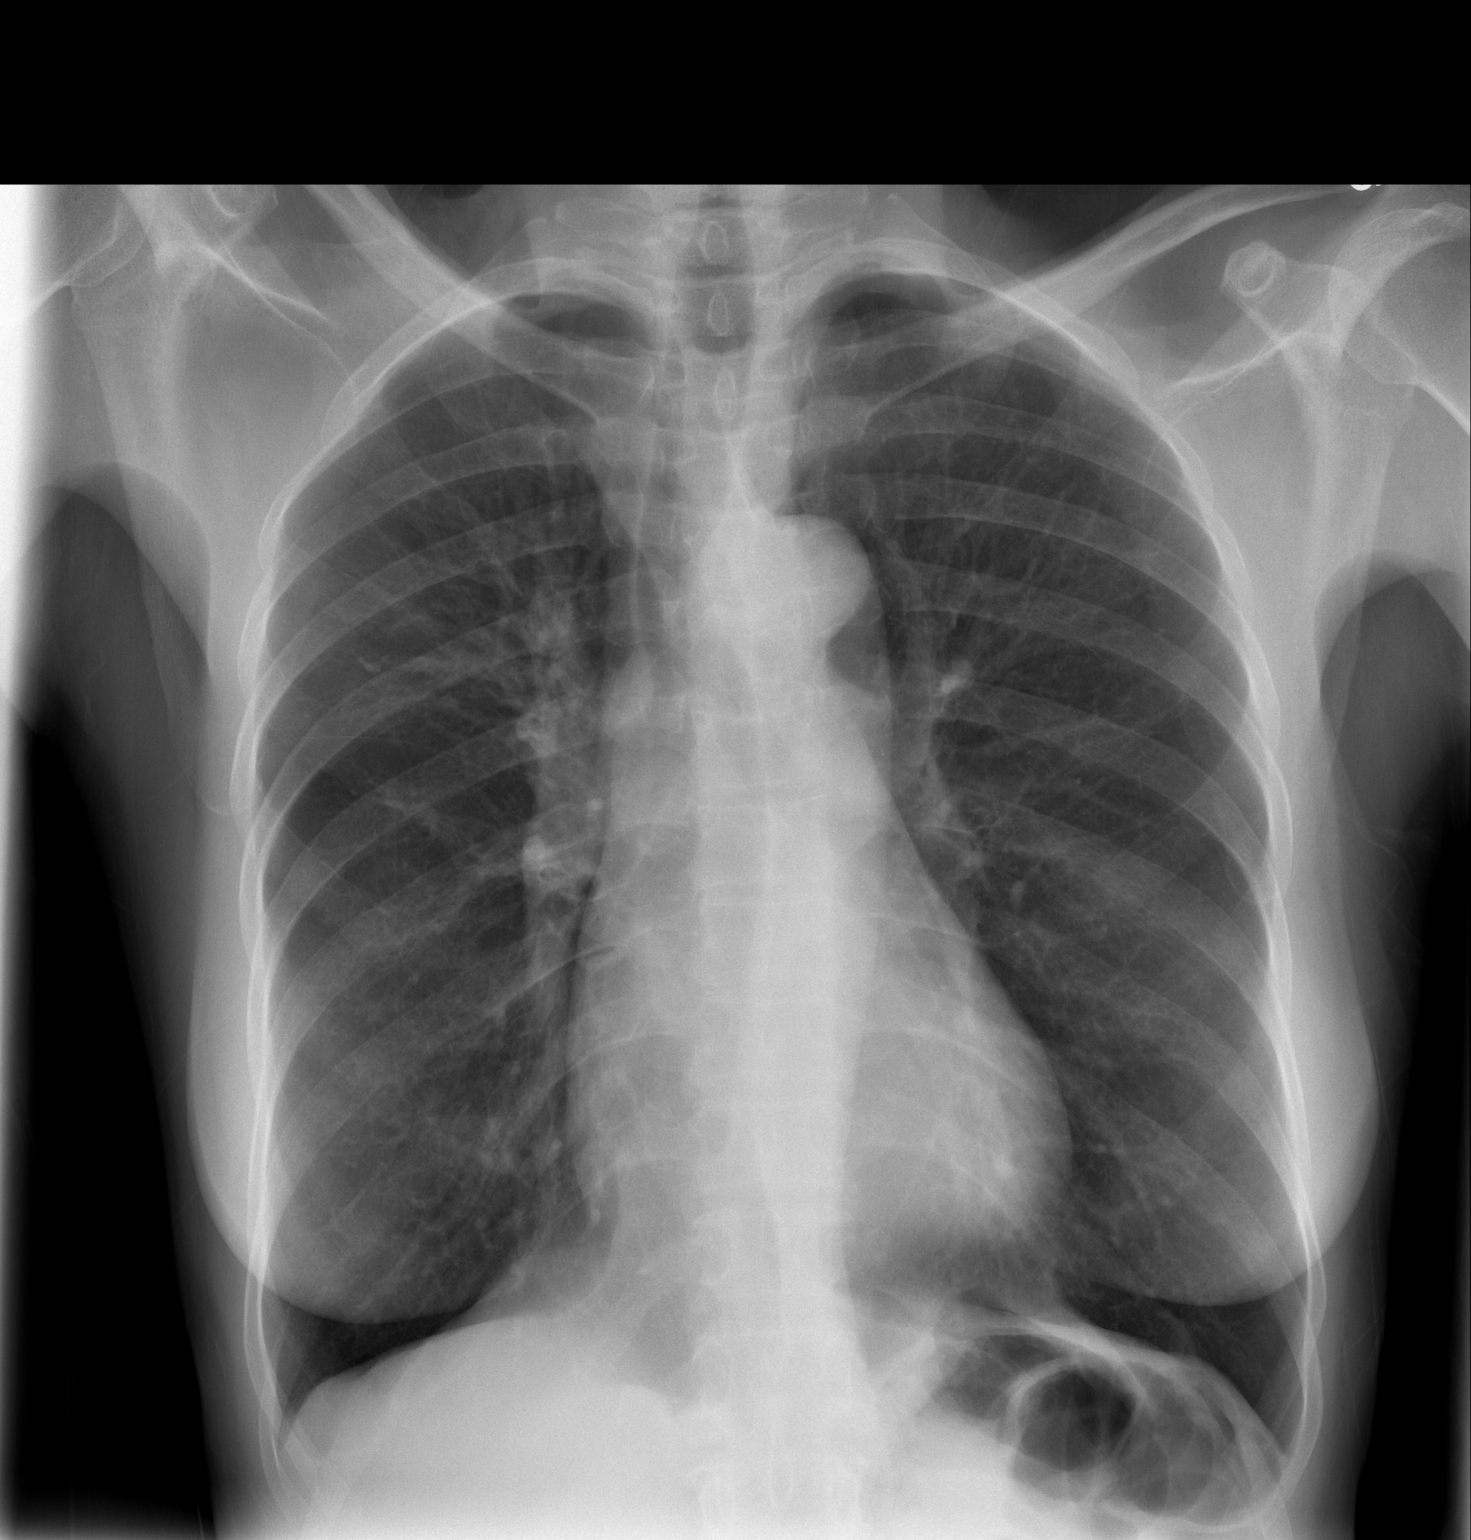

[w chest lat]
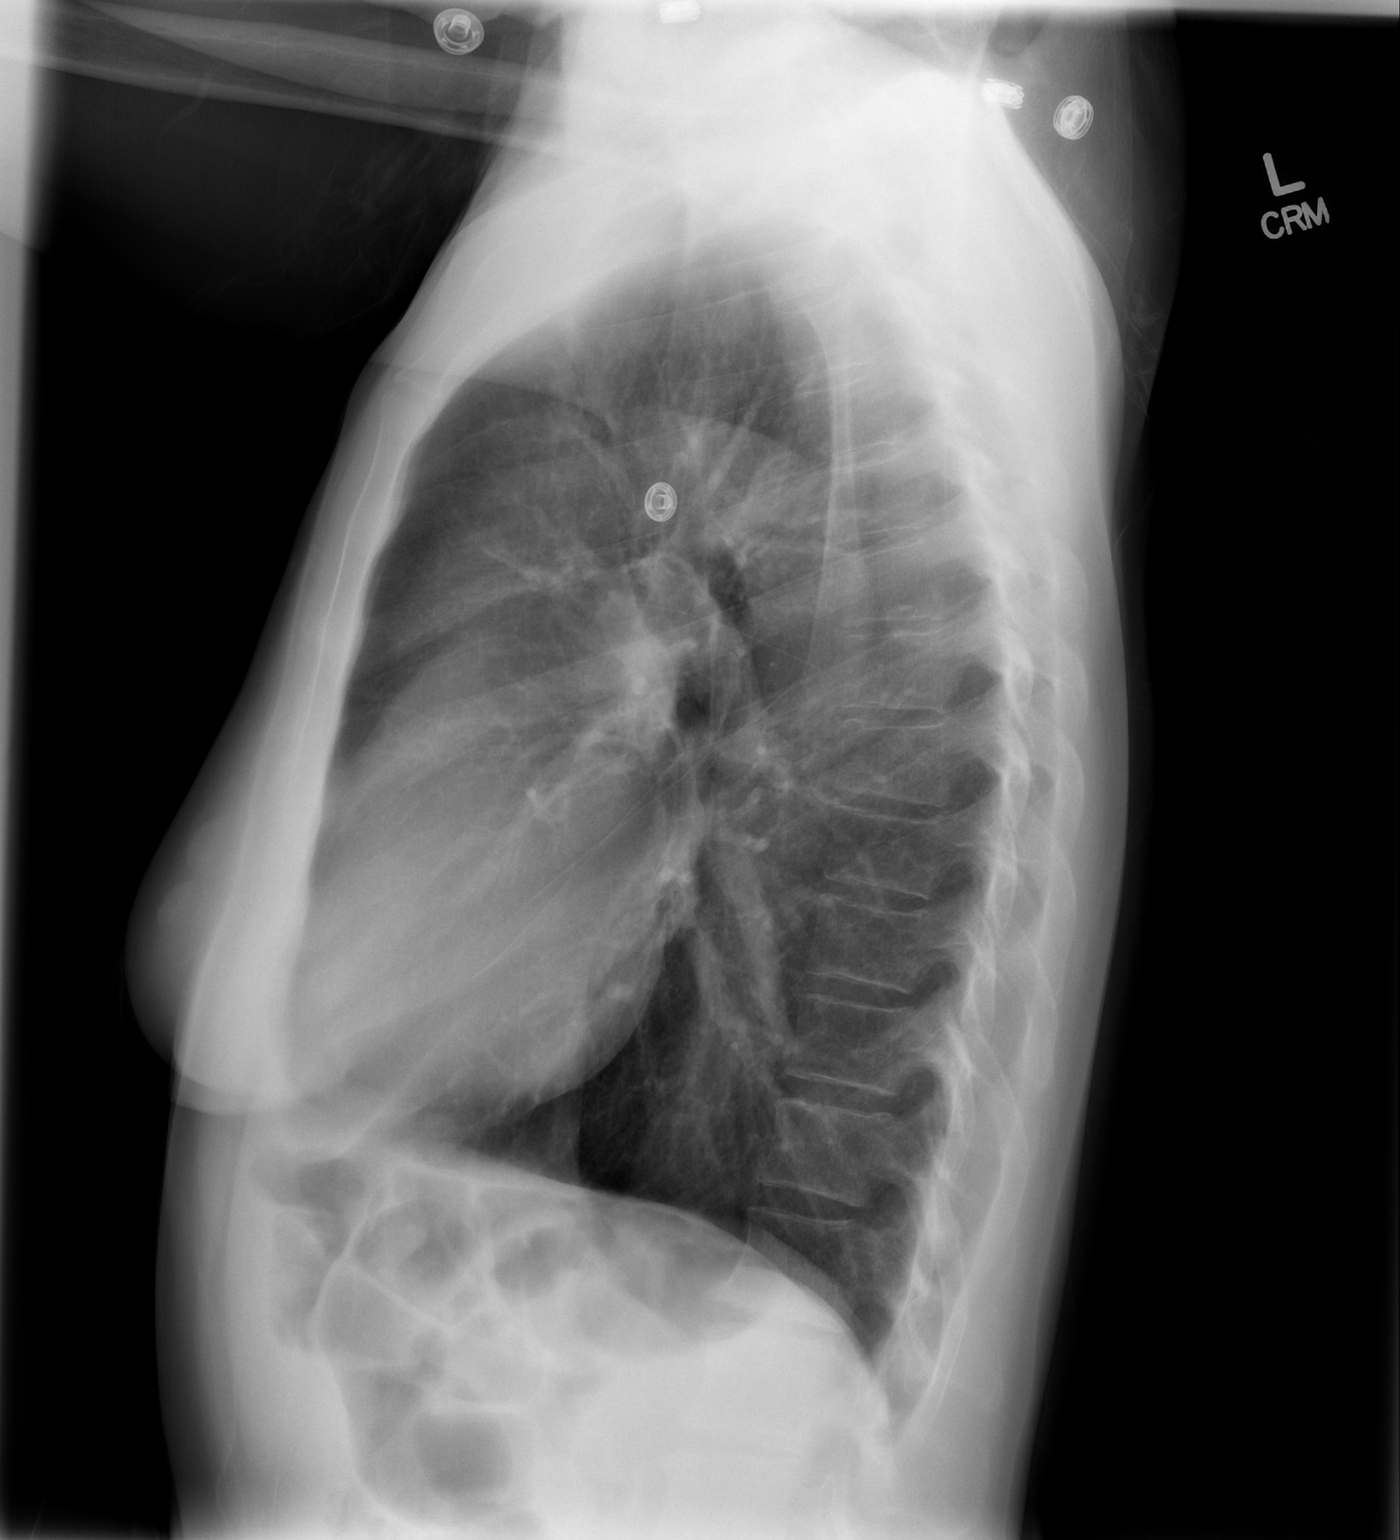

[2 of 2 positions shown; findings below may reference images not displayed]

FINDINGS: The lungs are hyperexpanded, with flattening of the
hemidiaphragms, suggestive of COPD.  Minimal opacity is suggested
near the right lung apex, which may reflect mild pneumonia.  No
pleural effusion or pneumothorax is seen.

The heart is normal in size; the mediastinal contour is within
normal limits.  No acute osseous abnormalities are seen.
IMPRESSION: 1.  Minimal opacity suggested near the right lung apex, which may
reflect mild pneumonia.
2.  Findings of COPD.

## 2014-03-24 IMAGING — CR DG CHEST 2V
2 series · 2 of 2 positions shown · non-contrast
Comparison: 10/23/2012

CLINICAL DATA: Shortness of breath, cough, recent pneumonia

CHEST - 2 VIEW

[w chest pa]
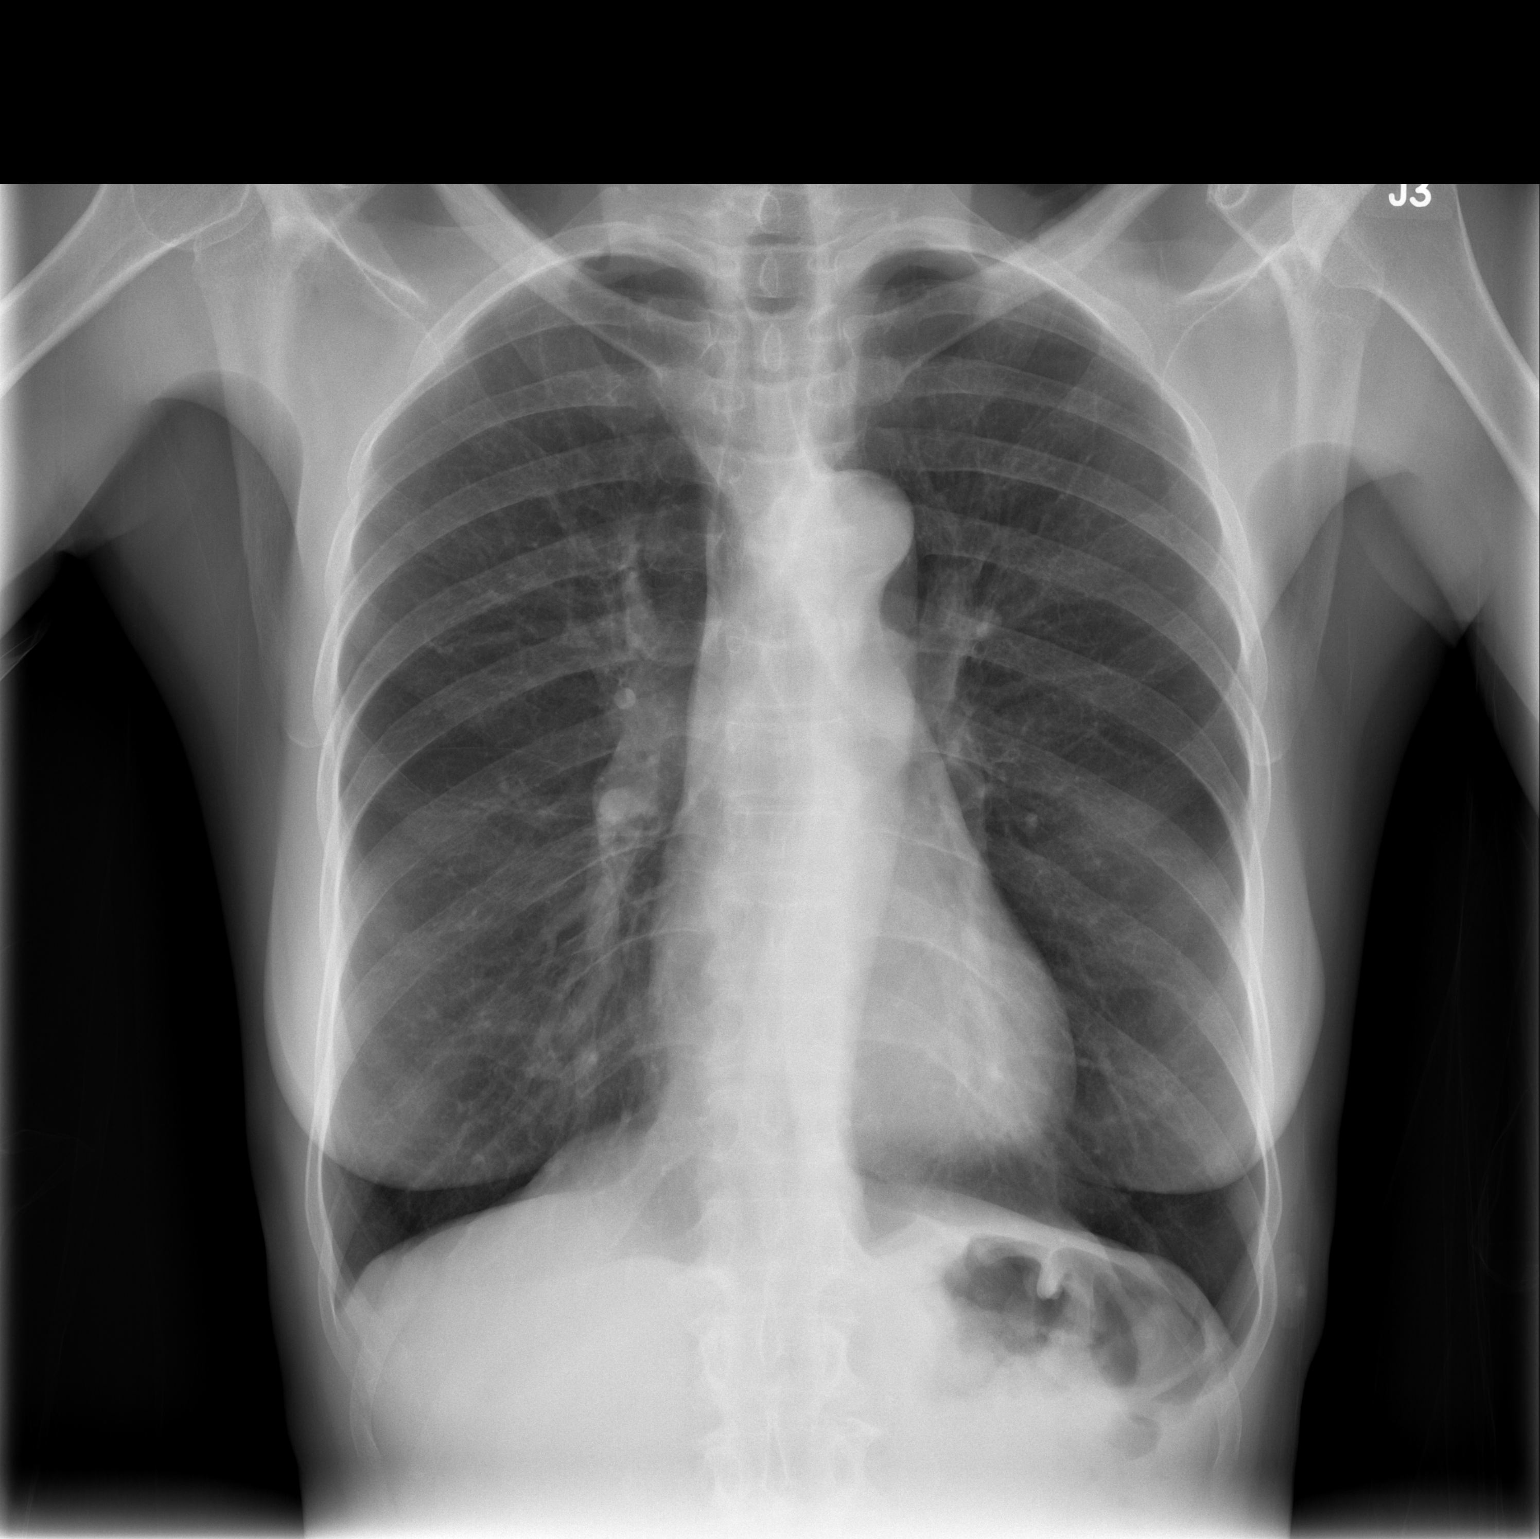

[w chest lat]
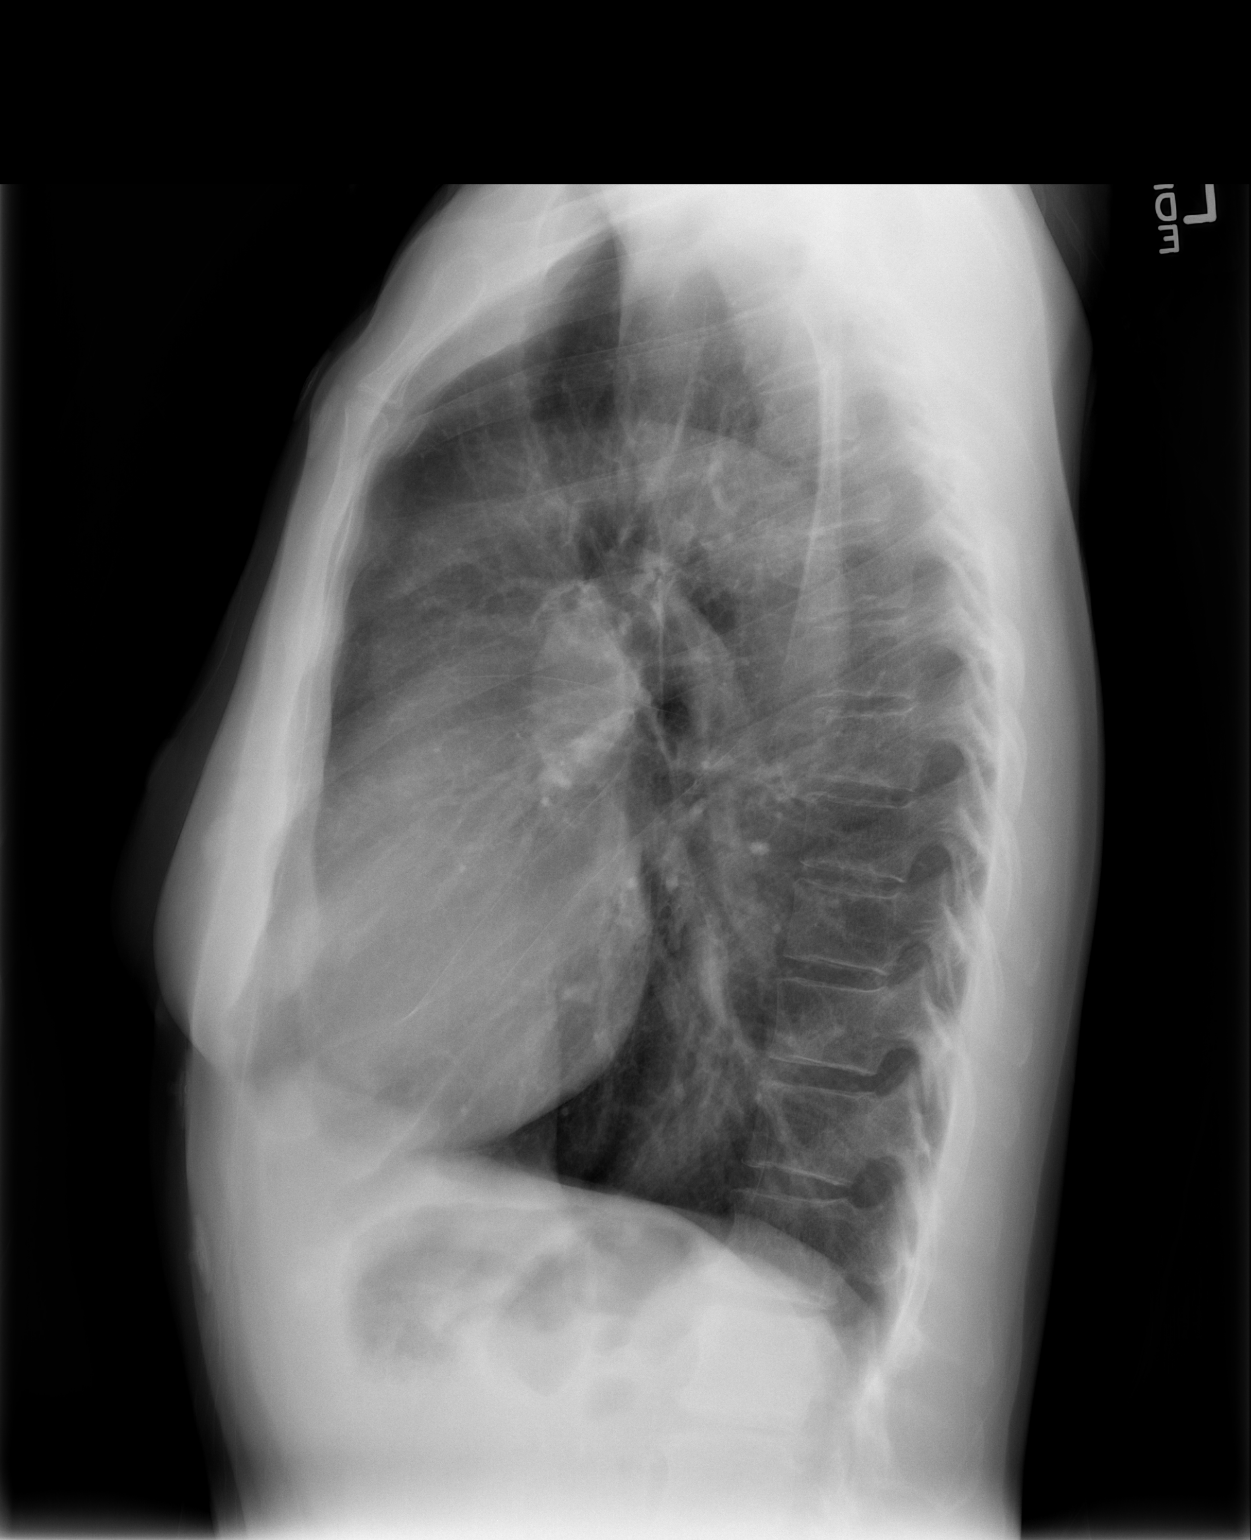

[2 of 2 positions shown; findings below may reference images not displayed]

FINDINGS: Cardiomediastinal silhouette is stable.  No acute
infiltrate or pleural effusion.  No pulmonary edema.
Hyperinflation again noted.  Bony thorax is stable.
IMPRESSION: No active disease.  Hyperinflation again noted.

## 2014-05-04 IMAGING — CR DG CHEST 1V PORT
2 series · 2 of 2 positions shown · non-contrast
Comparison: 12/06/2012

CLINICAL DATA: Shortness of breath

PORTABLE CHEST - 1 VIEW

[AP (1 of 2)]
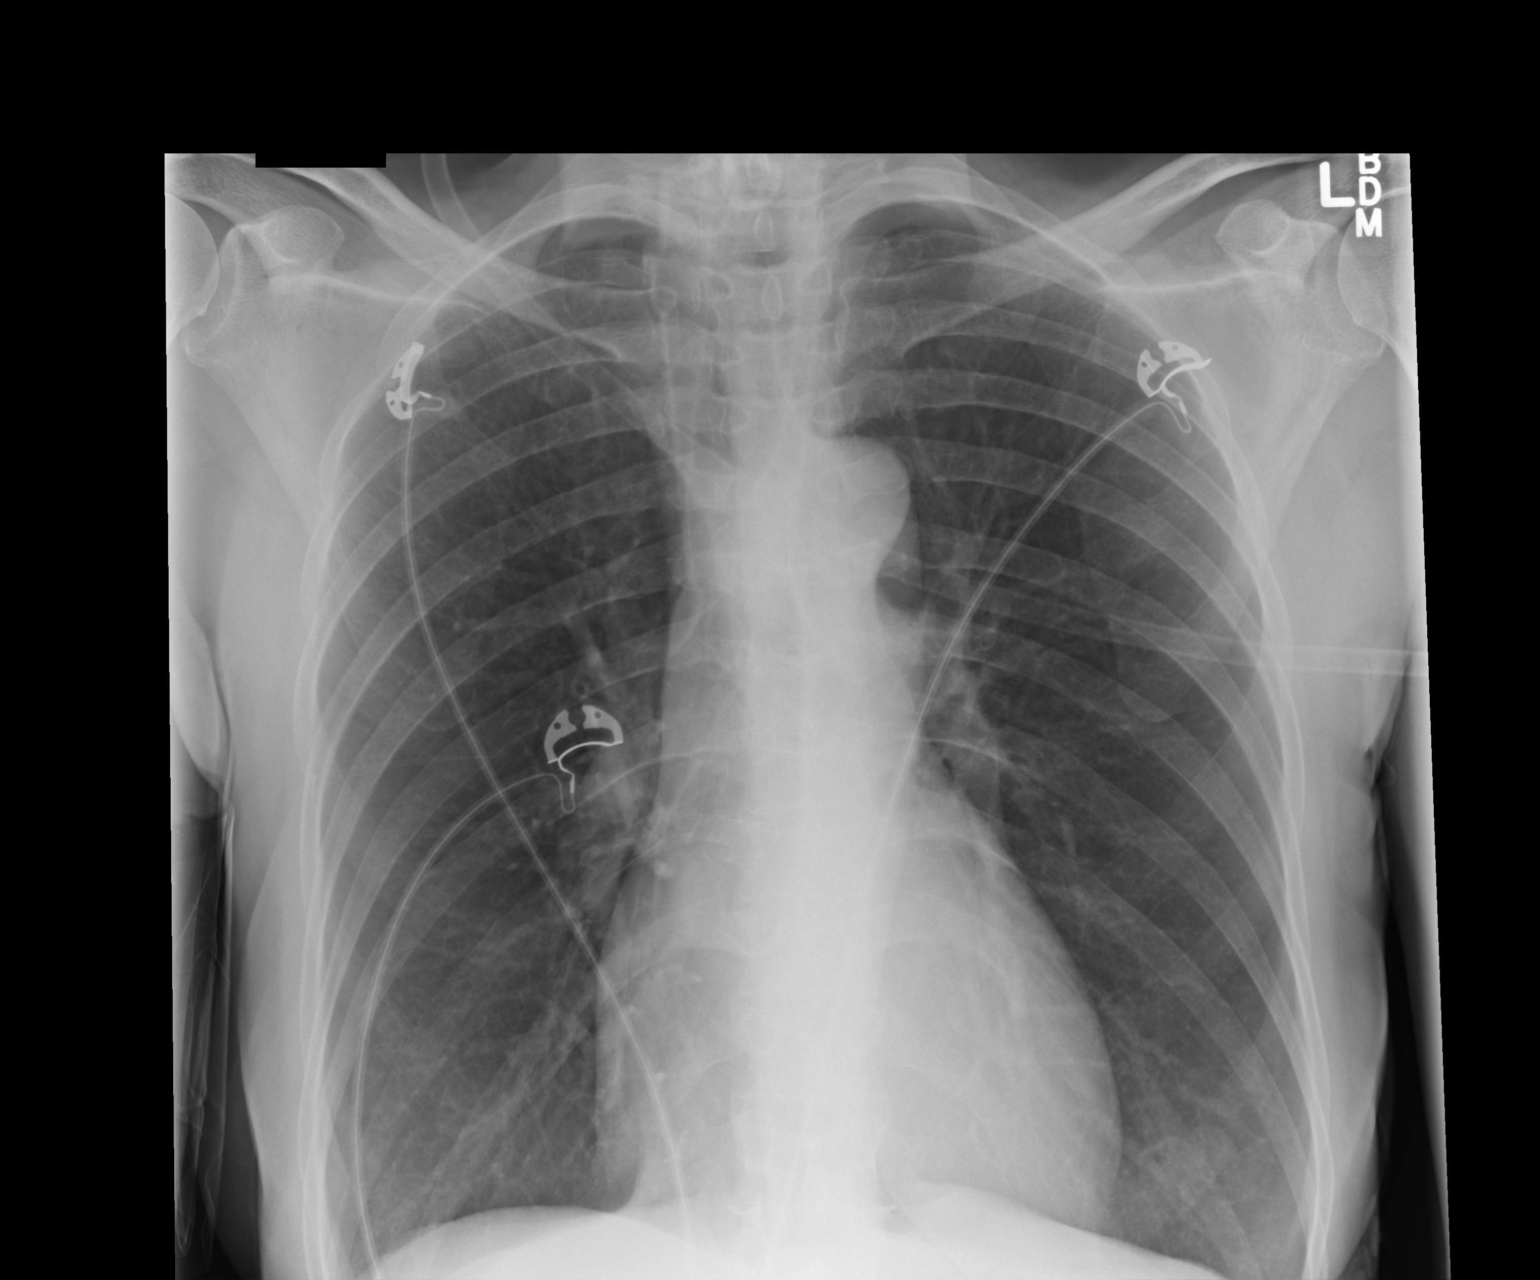

[AP (2 of 2)]
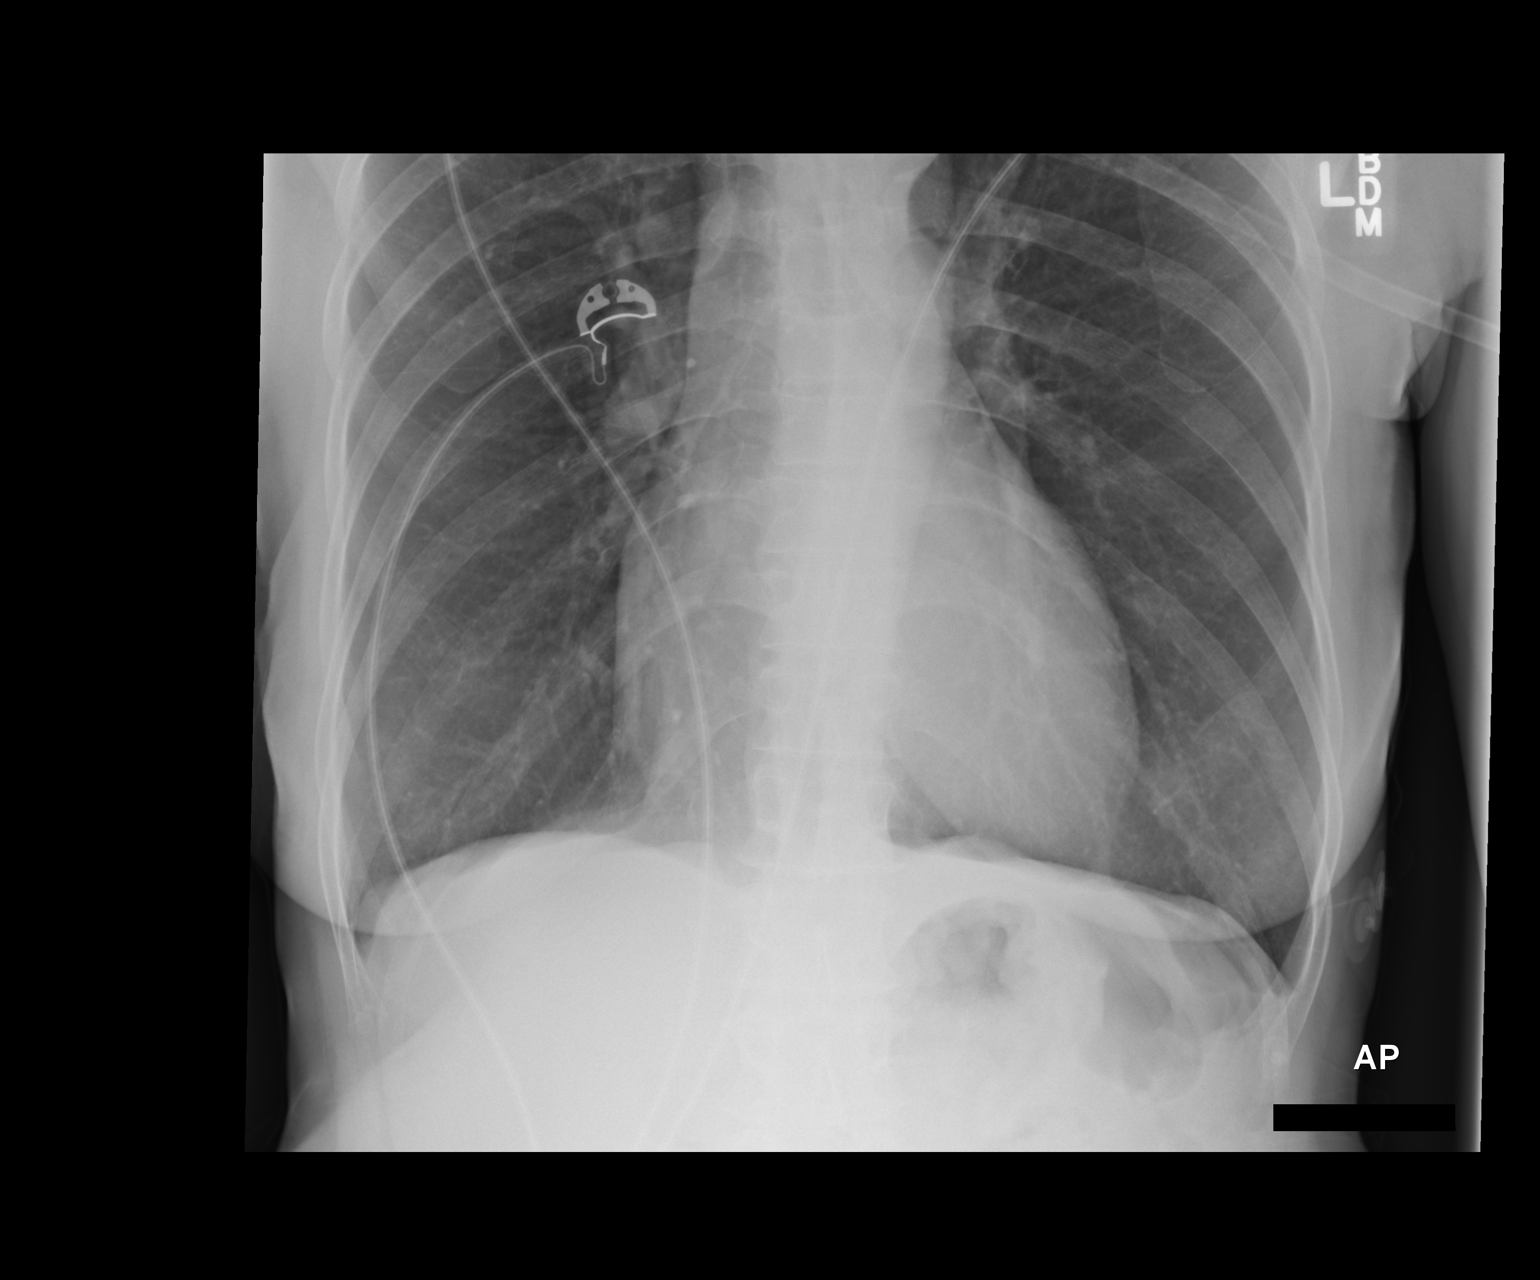

[2 of 2 positions shown; findings below may reference images not displayed]

FINDINGS: The heart and pulmonary vascularity are within normal
limits.  The lungs are again hyperinflated.  No focal infiltrate or
sizable effusion is seen.  No bony abnormality is noted.
IMPRESSION: COPD without acute abnormality.

## 2014-05-11 IMAGING — CT CT ABD-PELV W/ CM
2 of 5 series · 17 of 46 positions shown, 19 images · IV contrast (APPLIED)
Comparison: CT scan dated 02/02/2007

CLINICAL DATA: Left lower quadrant pain.  Nausea.

CT ABDOMEN AND PELVIS WITH CONTRAST
TECHNIQUE: Multidetector CT imaging of the abdomen and pelvis was
performed following the standard protocol during bolus
administration of intravenous contrast.
Contrast: 80mL OMNIPAQUE IOHEXOL 300 MG/ML  SOLN

[Series 2: abd/pelv with 5.0 b31f st · axial · 0.62mm/px · z∈[-416,-76]mm · 14 of 76 slices shown, 16 images]
[im 4/76  soft-tissue]
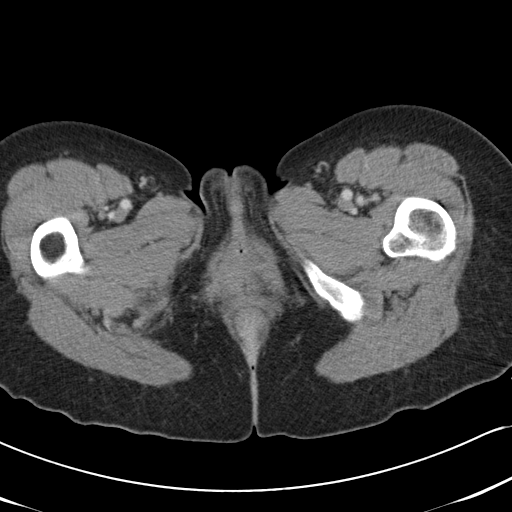
[im 4/76  bone]
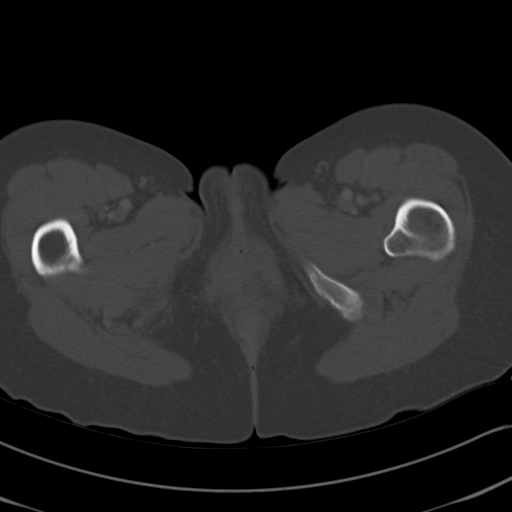
[im 8/76  soft-tissue]
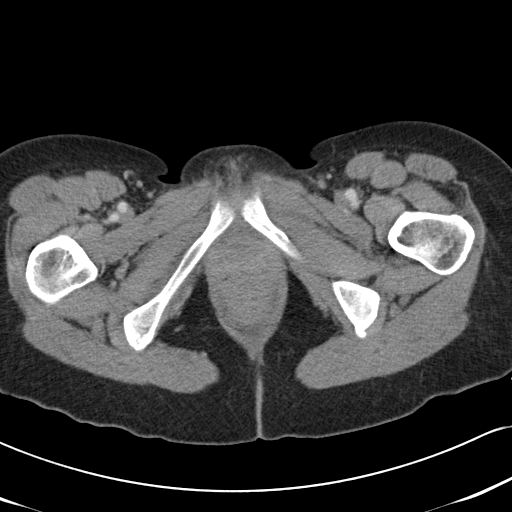
[im 16/76  soft-tissue]
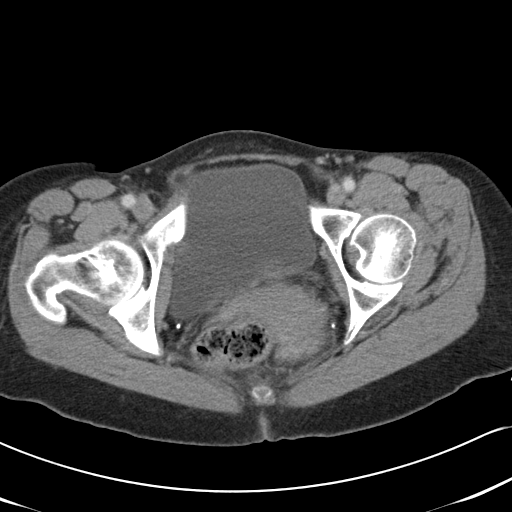
[im 20/76  soft-tissue]
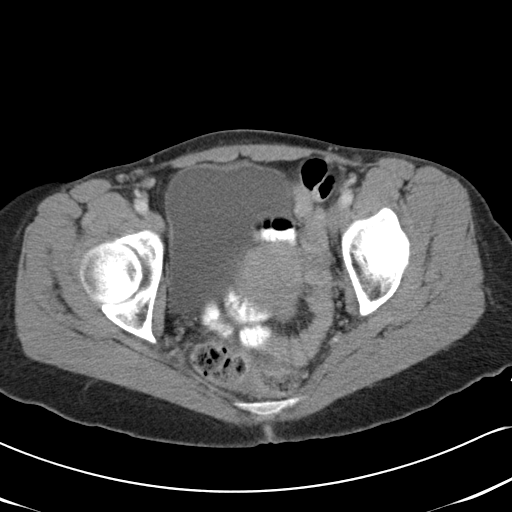
[im 24/76  soft-tissue]
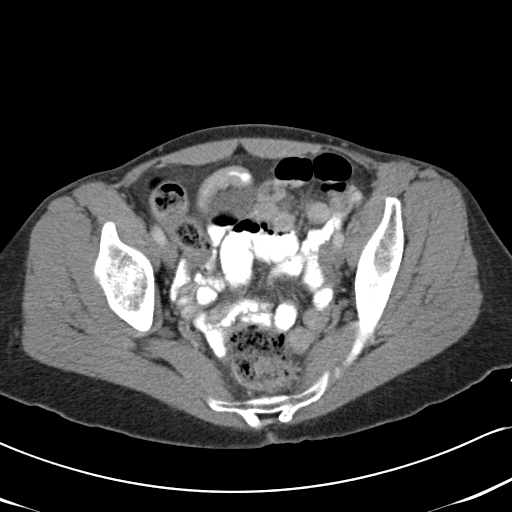
[im 32/76  soft-tissue]
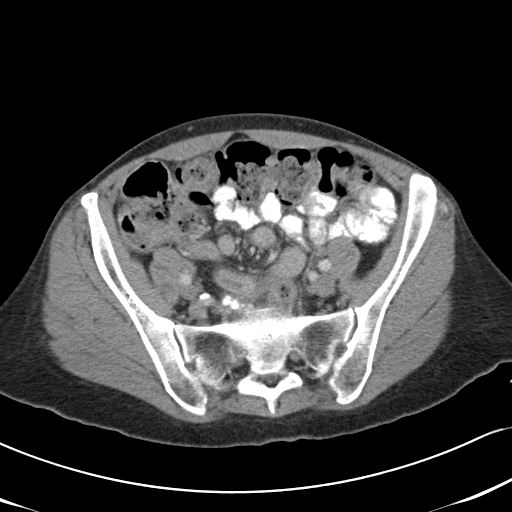
[im 36/76  soft-tissue]
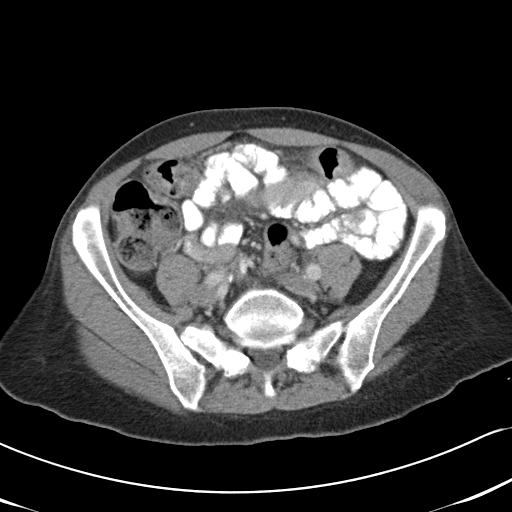
[im 40/76  soft-tissue]
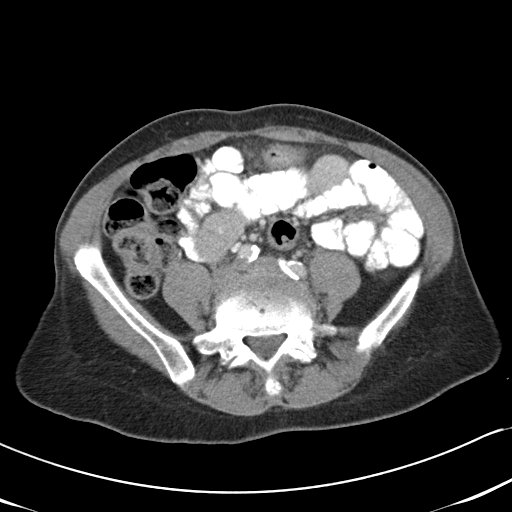
[im 44/76  soft-tissue]
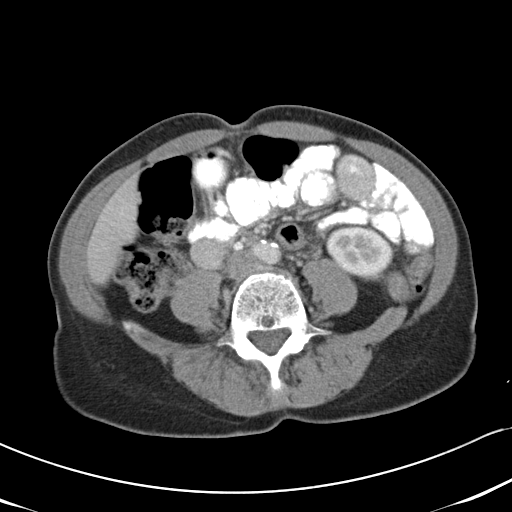
[im 44/76  bone]
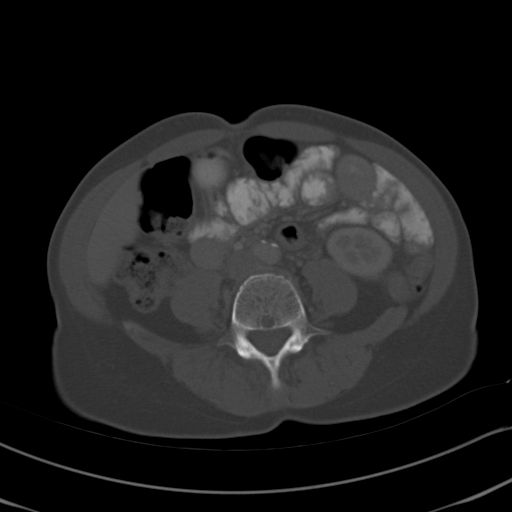
[im 52/76  soft-tissue]
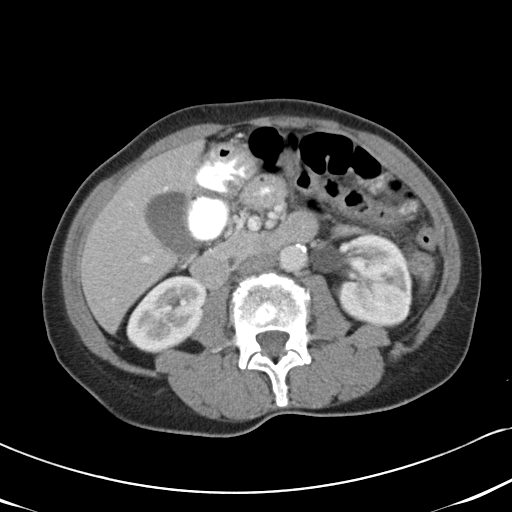
[im 56/76  soft-tissue]
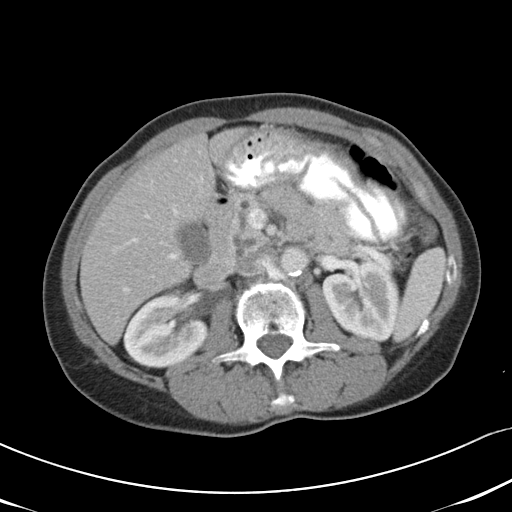
[im 60/76  soft-tissue]
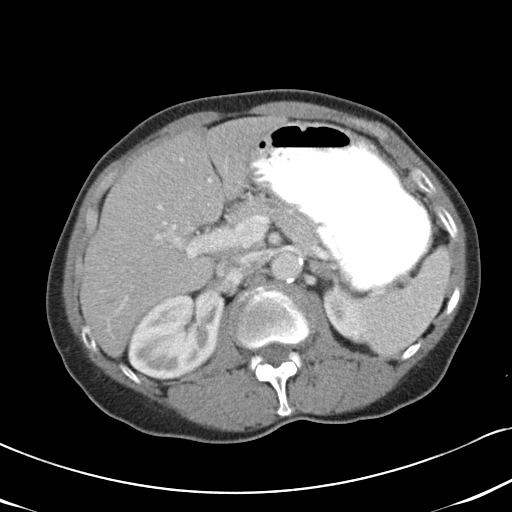
[im 68/76  soft-tissue]
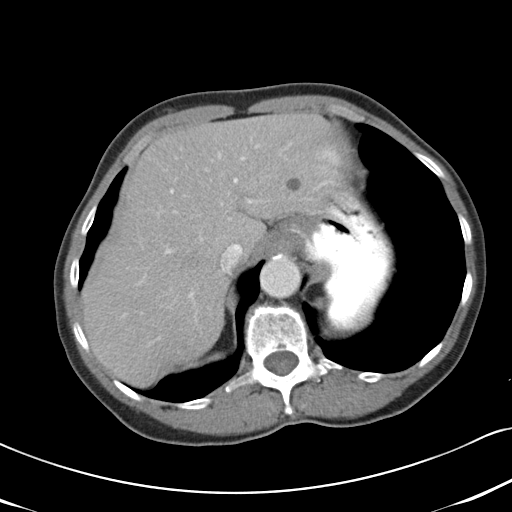
[im 72/76  soft-tissue]
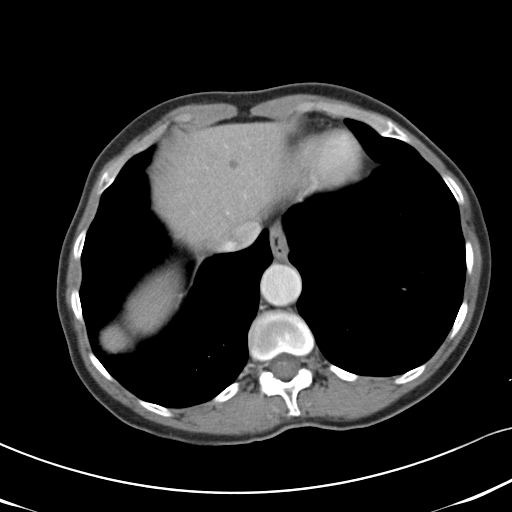

[Series 5: coronals · coronal · 0.73mm/px · 3 of 101 slices shown]
[im 34/101  soft-tissue]
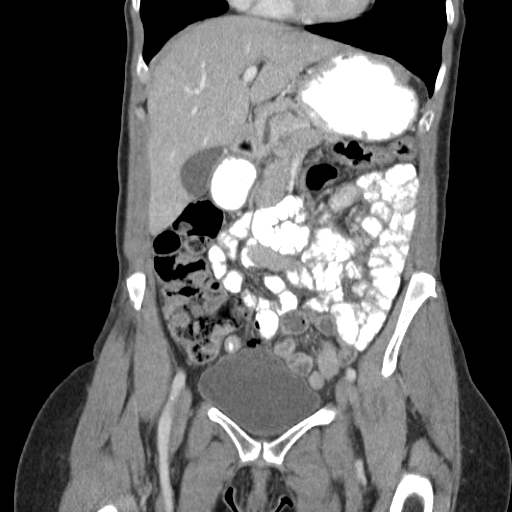
[im 45/101  soft-tissue]
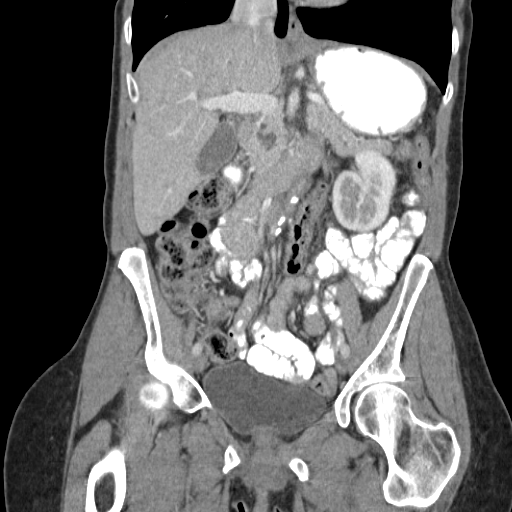
[im 56/101  soft-tissue]
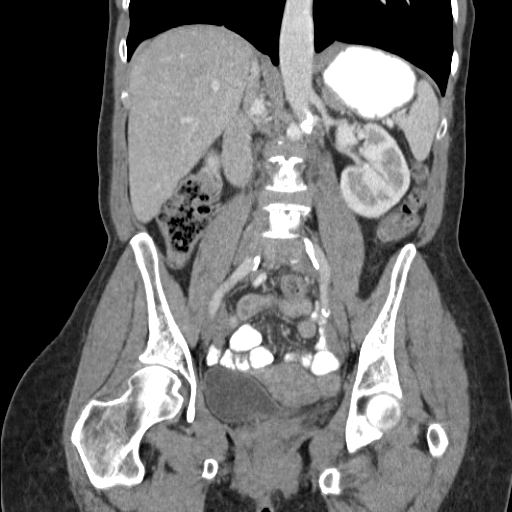

[17 of 46 positions shown; findings below may reference images not displayed]

FINDINGS: There are multiple small stable benign liver cysts.
Liver parenchyma is otherwise normal.

There is chronic dilatation of the common bile duct and pancreatic
duct, unchanged.  Pancreas is otherwise normal.  Biliary tree is
otherwise normal.

Spleen is normal.  Both adrenal glands are slightly prominent
consistent with a benign adrenal hyperplasia.  No mass lesions.

There is a tiny stone in the lower pole of the right kidney.  Left
kidney is normal.  The left ureter is slightly prominent throughout
its length but there is no ureteral stone.  Bladder appears normal.

The bowel appears normal including the appendix and terminal ileum.
No free air or free fluid.  Uterus is deviated to the left.
Ovaries are normal.  No acute osseous abnormality.
IMPRESSION: No acute abnormalities of the abdomen or pelvis.

## 2014-05-23 ENCOUNTER — Emergency Department (HOSPITAL_COMMUNITY)
Admission: EM | Admit: 2014-05-23 | Discharge: 2014-05-23 | Disposition: A | Discharge status: Home / Self Care | Payer: Medicare Other | Attending: Emergency Medicine | Admitting: Emergency Medicine

## 2014-05-23 ENCOUNTER — Encounter (HOSPITAL_COMMUNITY): Payer: Self-pay | Admitting: Emergency Medicine

## 2014-05-23 DIAGNOSIS — K219 Gastro-esophageal reflux disease without esophagitis: Secondary | ICD-10-CM | POA: Insufficient documentation

## 2014-05-23 DIAGNOSIS — I4891 Unspecified atrial fibrillation: Secondary | ICD-10-CM | POA: Insufficient documentation

## 2014-05-23 DIAGNOSIS — J45909 Unspecified asthma, uncomplicated: Secondary | ICD-10-CM | POA: Insufficient documentation

## 2014-05-23 DIAGNOSIS — Z79899 Other long term (current) drug therapy: Secondary | ICD-10-CM | POA: Insufficient documentation

## 2014-05-23 DIAGNOSIS — G43909 Migraine, unspecified, not intractable, without status migrainosus: Secondary | ICD-10-CM | POA: Diagnosis present

## 2014-05-23 DIAGNOSIS — Z87891 Personal history of nicotine dependence: Secondary | ICD-10-CM | POA: Insufficient documentation

## 2014-05-23 DIAGNOSIS — Z7951 Long term (current) use of inhaled steroids: Secondary | ICD-10-CM | POA: Diagnosis not present

## 2014-05-23 DIAGNOSIS — I10 Essential (primary) hypertension: Secondary | ICD-10-CM | POA: Diagnosis not present

## 2014-05-23 DIAGNOSIS — G43009 Migraine without aura, not intractable, without status migrainosus: Secondary | ICD-10-CM

## 2014-05-23 DIAGNOSIS — Z7952 Long term (current) use of systemic steroids: Secondary | ICD-10-CM | POA: Insufficient documentation

## 2014-05-23 MED ORDER — HYDROCODONE-ACETAMINOPHEN 5-325 MG PO TABS: 1.0000 | ORAL_TABLET | Freq: Four times a day (QID) | ORAL | Status: DC | PRN

## 2014-05-23 MED ORDER — HYDROMORPHONE HCL 1 MG/ML IJ SOLN
1.0000 mg | Freq: Once | INTRAMUSCULAR | Status: AC
  Administered 2014-05-23: 1 mg via INTRAVENOUS
  Filled 2014-05-23: qty 1

## 2014-05-23 MED ORDER — METOCLOPRAMIDE HCL 10 MG PO TABS: 10.0000 mg | ORAL_TABLET | Freq: Four times a day (QID) | ORAL | Status: DC | PRN

## 2014-05-23 MED ORDER — METOCLOPRAMIDE HCL 5 MG/ML IJ SOLN
10.0000 mg | Freq: Once | INTRAMUSCULAR | Status: AC
  Administered 2014-05-23: 10 mg via INTRAVENOUS
  Filled 2014-05-23: qty 2

## 2014-05-23 NOTE — Discharge Instructions (Signed)
Migraine Headache Take the medication prescribed as needed for bad pain or Tylenol for mild pain. Follow up with your primary care physician if headaches continue in 2 or 3 days. Return if your condition worsens for any reason A migraine headache is very bad, throbbing pain on one or both sides of your head. Talk to your doctor about what things may bring on (trigger) your migraine headaches. HOME CARE  Only take medicines as told by your doctor.  Lie down in a dark, quiet room when you have a migraine.  Keep a journal to find out if certain things bring on migraine headaches. For example, write down:  What you eat and drink.  How much sleep you get.  Any change to your diet or medicines.  Lessen how much alcohol you drink.  Quit smoking if you smoke.  Get enough sleep.  Lessen any stress in your life.  Keep lights dim if bright lights bother you or make your migraines worse. GET HELP RIGHT AWAY IF:   Your migraine becomes really bad.  You have a fever.  You have a stiff neck.  You have trouble seeing.  Your muscles are weak, or you lose muscle control.  You lose your balance or have trouble walking.  You feel like you will pass out (faint), or you pass out.  You have really bad symptoms that are different than your first symptoms. MAKE SURE YOU:   Understand these instructions.  Will watch your condition.  Will get help right away if you are not doing well or get worse. Document Released: 05/18/2008 Document Revised: 11/01/2011 Document Reviewed: 04/16/2013 [facility] Patient Information 2015 [place], [place]. This information is not intended to replace advice given to you by your health care provider. Make sure you discuss any questions you have with your health care provider.

## 2014-05-23 NOTE — ED Notes (Signed)
Pt states she also has tingling in her hands which began 2 weeks ago.

## 2014-05-23 NOTE — ED Notes (Signed)
Pt states she has had migraines in the past. Current migraine has been going on for two weeks. Pt states she has took Tylenol with no relief.

## 2014-05-23 NOTE — ED Provider Notes (Signed)
CSN: [id number]     Arrival date & time 05/23/14  0932 History   First MD Initiated Contact with Patient 05/23/14 [phone]     Chief Complaint  Patient presents with  . Migraine     (Consider location/radiation/quality/duration/timing/severity/associated sxs/prior Treatment) HPI Complains of left-sided temporal headache gradual onset 1.5 weeks ago. Associated symptoms include nausea,phonophobia and photophobia. No fever. Pain not made better by anything. Worse with loud noises and bright lights.. Treated with Tylenol, without relief. Pain is typical of migraine she's had in the past. No fever no trauma. No other associated symptoms Past Medical History  Diagnosis Date  . Asthma   . Hypertension   . GERD (gastroesophageal reflux disease)   . A-fib   . Migraine    Past Surgical History  Procedure Laterality Date  . Tubal ligation     Family History  Problem Relation Age of Onset  . Cancer Mother   . Cancer Father   . Heart attack Mother 68   History  Substance Use Topics  . Smoking status: Former Smoker    Types: Cigarettes    Quit date: 11/07/2011  . Smokeless tobacco: Never Used  . Alcohol Use: No   OB History   Grav Para Term Preterm Abortions TAB SAB Ect Mult Living                 Review of Systems  Constitutional: Negative.   HENT:       Phonophobic  Eyes: Positive for photophobia.  Respiratory: Negative.   Cardiovascular: Negative.   Gastrointestinal: Negative.   Musculoskeletal: Negative.   Skin: Negative.   Neurological: Positive for headaches.  Psychiatric/Behavioral: Negative.   All other systems reviewed and are negative.     Allergies  Pantoprazole and Celecoxib  Home Medications   Prior to Admission medications   Medication Sig Start Date End Date Taking? Authorizing Provider  albuterol (PROVENTIL HFA;VENTOLIN HFA) 108 (90 BASE) MCG/ACT inhaler Inhale 2 puffs into the lungs every 4 (four) hours as needed for wheezing or shortness of breath.  12/06/12   Montine Circle, PA-C  benzonatate (TESSALON) 100 MG capsule Take 1 capsule (100 mg total) by mouth every 8 (eight) hours. 02/01/14   Dorie Rank, MD  famotidine (PEPCID) 20 MG tablet Take 1 tablet (20 mg total) by mouth 2 (two) times daily. 02/01/14   Dorie Rank, MD  Fluticasone-Salmeterol (ADVAIR) 250-50 MCG/DOSE AEPB Inhale 1 puff into the lungs 2 (two) times daily.    Historical Provider, MD  ondansetron (ZOFRAN) 4 MG tablet Take 1 tablet (4 mg total) by mouth every 6 (six) hours. 02/01/14   Dorie Rank, MD  potassium chloride SA (K-DUR,KLOR-CON) 20 MEQ tablet Take 1 tablet (20 mEq total) by mouth 2 (two) times daily. 01/23/13   Montine Circle, PA-C  predniSONE (DELTASONE) 20 MG tablet Take 3 tablets (60 mg total) by mouth daily. 02/01/14   Dorie Rank, MD  tiotropium (SPIRIVA HANDIHALER) 18 MCG inhalation capsule Place 1 capsule (18 mcg total) into inhaler and inhale daily. 06/22/13   Leota Jacobsen, MD   BP 130/88  Pulse 83  Temp(Src) 97.8 F (36.6 C) (Oral)  Resp 20  SpO2 100% Physical Exam  Nursing note and vitals reviewed. Constitutional: She is oriented to person, place, and time. She appears well-developed and well-nourished. No distress.  HENT:  Head: Normocephalic and atraumatic.  Eyes: Conjunctivae are normal. Pupils are equal, round, and reactive to light.  Optic discs sharp bilaterally  Neck: Neck supple. No  tracheal deviation present. No thyromegaly present.  Cardiovascular: Normal rate and regular rhythm.   No murmur heard. Pulmonary/Chest: Effort normal and breath sounds normal.  Abdominal: Soft. Bowel sounds are normal. She exhibits no distension. There is no tenderness.  Musculoskeletal: Normal range of motion. She exhibits no edema and no tenderness.  Neurological: She is alert and oriented to person, place, and time. She has normal reflexes. No cranial nerve deficit. Coordination normal.  DTRs symmetric bilaterally knee jerk ankle jerk biceps toes downward going  bilaterally gait normal Romberg normal pronator drift normal heel to shin normal finger to nose normal  Skin: Skin is warm and dry. No rash noted.  Psychiatric: She has a normal mood and affect.    ED Course  Procedures (including critical care time) Labs Review Labs Reviewed - No data to display  Imaging Review No results found.   EKG Interpretation None     10:25 AM headache improved after treatment with intravenous Reglan. Nausea has resolved. She is requesting more pain medicine however. Hydromorphone intravenously ordered. 11 AM patient states pain is much improved and she feels ready to go home. She is alert and ambulates without difficulty Glasgow Coma Score 15 MDM   Final diagnoses:  None   Plan follow up with primary care physician if headaches continue. Prescription Reglan, Norco Diagnosis migraine    Orlie Dakin, MD 05/23/14 [phone]

## 2014-05-23 NOTE — ED Notes (Signed)
Pt reports headache for past week and a half, has not had migraines for several years. Reports nausea, sensitivity to light and sound. HA on L side of face.

## 2014-06-06 ENCOUNTER — Emergency Department (HOSPITAL_COMMUNITY): Payer: Medicare Other

## 2014-06-06 ENCOUNTER — Inpatient Hospital Stay (HOSPITAL_COMMUNITY)
Admission: EM | Admit: 2014-06-06 | Discharge: 2014-06-07 | DRG: 309 | Disposition: A | Discharge status: Home / Self Care | Payer: Medicare Other | Attending: Internal Medicine | Admitting: Internal Medicine

## 2014-06-06 ENCOUNTER — Encounter (HOSPITAL_COMMUNITY): Payer: Self-pay | Admitting: Emergency Medicine

## 2014-06-06 DIAGNOSIS — Z888 Allergy status to other drugs, medicaments and biological substances status: Secondary | ICD-10-CM | POA: Diagnosis not present

## 2014-06-06 DIAGNOSIS — G43909 Migraine, unspecified, not intractable, without status migrainosus: Secondary | ICD-10-CM | POA: Diagnosis present

## 2014-06-06 DIAGNOSIS — I482 Chronic atrial fibrillation: Secondary | ICD-10-CM | POA: Diagnosis present

## 2014-06-06 DIAGNOSIS — R0602 Shortness of breath: Secondary | ICD-10-CM | POA: Diagnosis not present

## 2014-06-06 DIAGNOSIS — K219 Gastro-esophageal reflux disease without esophagitis: Secondary | ICD-10-CM | POA: Diagnosis present

## 2014-06-06 DIAGNOSIS — J441 Chronic obstructive pulmonary disease with (acute) exacerbation: Secondary | ICD-10-CM | POA: Diagnosis present

## 2014-06-06 DIAGNOSIS — I4891 Unspecified atrial fibrillation: Secondary | ICD-10-CM | POA: Diagnosis present

## 2014-06-06 DIAGNOSIS — I10 Essential (primary) hypertension: Secondary | ICD-10-CM | POA: Diagnosis present

## 2014-06-06 DIAGNOSIS — J45909 Unspecified asthma, uncomplicated: Secondary | ICD-10-CM | POA: Diagnosis present

## 2014-06-06 DIAGNOSIS — Z87891 Personal history of nicotine dependence: Secondary | ICD-10-CM

## 2014-06-06 DIAGNOSIS — I480 Paroxysmal atrial fibrillation: Secondary | ICD-10-CM | POA: Diagnosis not present

## 2014-06-06 DIAGNOSIS — Z79899 Other long term (current) drug therapy: Secondary | ICD-10-CM

## 2014-06-06 LAB — BASIC METABOLIC PANEL
ANION GAP: 13 (ref 5–15)
BUN: 12 mg/dL (ref 6–23)
CALCIUM: 10.7 mg/dL — AB (ref 8.4–10.5)
CO2: 28 mEq/L (ref 19–32)
Chloride: 102 mEq/L (ref 96–112)
Creatinine, Ser: 0.73 mg/dL (ref 0.50–1.10)
GFR calc Af Amer: >90 mL/min (ref >90)
GFR, EST NON AFRICAN AMERICAN: 88 mL/min — AB (ref >90)
GLUCOSE: 100 mg/dL — AB (ref 70–99)
POTASSIUM: 3.3 meq/L — AB (ref 3.7–5.3)
SODIUM: 143 meq/L (ref 137–147)

## 2014-06-06 LAB — CBC
HCT: 41 % (ref 36.0–46.0)
Hemoglobin: 13.6 g/dL (ref 12.0–15.0)
MCH: 27 pg (ref 26.0–34.0)
MCHC: 33.2 g/dL (ref 30.0–36.0)
MCV: 81.5 fL (ref 78.0–100.0)
Platelets: 224 10*3/uL (ref 150–400)
RBC: 5.03 MIL/uL (ref 3.87–5.11)
RDW: 15.2 % (ref 11.5–15.5)
WBC: 5.7 10*3/uL (ref 4.0–10.5)

## 2014-06-06 LAB — I-STAT TROPONIN, ED: TROPONIN I, POC: 0.01 ng/mL (ref 0.00–0.08)

## 2014-06-06 MED ORDER — DILTIAZEM HCL 100 MG IV SOLR
5.0000 mg/h | Freq: Once | INTRAVENOUS | Status: AC
  Administered 2014-06-06: 5 mg/h via INTRAVENOUS

## 2014-06-06 MED ORDER — PREDNISONE 20 MG PO TABS
60.0000 mg | ORAL_TABLET | Freq: Once | ORAL | Status: AC
  Administered 2014-06-06: 60 mg via ORAL
  Filled 2014-06-06: qty 3

## 2014-06-06 MED ORDER — ALBUTEROL SULFATE (2.5 MG/3ML) 0.083% IN NEBU
5.0000 mg | INHALATION_SOLUTION | Freq: Once | RESPIRATORY_TRACT | Status: AC
  Administered 2014-06-06: 5 mg via RESPIRATORY_TRACT
  Filled 2014-06-06: qty 6

## 2014-06-06 NOTE — ED Provider Notes (Signed)
CSN: [id number]     Arrival date & time 06/06/14  1416 History   First MD Initiated Contact with Patient 06/06/14 [place]     Chief Complaint  Patient presents with  . Shortness of Breath     Patient is a 63 y.o. female presenting with shortness of breath. The history is provided by the patient.  Shortness of Breath Severity:  Moderate Onset quality:  Gradual Duration:  2 weeks Timing:  Intermittent Progression:  Worsening Chronicity:  New Relieved by: nebulizers. Worsened by:  Nothing tried Associated symptoms: cough and sputum production   Associated symptoms: no fever and no hemoptysis   Associated symptoms comment:  Chest pain with cough  patient reports cough/congestion for past 2 weeks.  No hemoptysis She reports CP only with cough She received albuterol prior to my evaluation and now feels improved   Past Medical History  Diagnosis Date  . Asthma   . Hypertension   . GERD (gastroesophageal reflux disease)   . A-fib   . Migraine    Past Surgical History  Procedure Laterality Date  . Tubal ligation     Family History  Problem Relation Age of Onset  . Cancer Mother   . Cancer Father   . Heart attack Mother 78   History  Substance Use Topics  . Smoking status: Former Smoker    Types: Cigarettes    Quit date: 11/07/2011  . Smokeless tobacco: Never Used  . Alcohol Use: No   OB History   Grav Para Term Preterm Abortions TAB SAB Ect Mult Living                 Review of Systems  Constitutional: Negative for fever.  Respiratory: Positive for cough, sputum production and shortness of breath. Negative for hemoptysis.   All other systems reviewed and are negative.     Allergies  Pantoprazole and Celecoxib  Home Medications   Prior to Admission medications   Medication Sig Start Date End Date Taking? Authorizing Provider  acetaminophen (TYLENOL) 500 MG tablet Take 1,000 mg by mouth every 6 (six) hours as needed for headache.   Yes Historical Provider,  MD  albuterol (PROVENTIL HFA;VENTOLIN HFA) 108 (90 BASE) MCG/ACT inhaler Inhale 2 puffs into the lungs every 4 (four) hours as needed for wheezing or shortness of breath. 12/06/12  Yes Montine Circle, PA-C  cetirizine (ZYRTEC) 10 MG tablet Take 10 mg by mouth daily as needed for allergies.   Yes Historical Provider, MD  hydrochlorothiazide (HYDRODIURIL) 25 MG tablet Take 25 mg by mouth daily.   Yes Historical Provider, MD  HYDROcodone-acetaminophen (NORCO) 5-325 MG per tablet Take 1-2 tablets by mouth every 6 (six) hours as needed for moderate pain or severe pain. 05/23/14  Yes Orlie Dakin, MD  lisinopril (PRINIVIL,ZESTRIL) 10 MG tablet Take 10 mg by mouth daily.   Yes Historical Provider, MD  metoCLOPramide (REGLAN) 10 MG tablet Take 1 tablet (10 mg total) by mouth every 6 (six) hours as needed for nausea (nausea/headache). 05/23/14  Yes Orlie Dakin, MD  potassium chloride SA (K-DUR,KLOR-CON) 20 MEQ tablet Take 1 tablet (20 mEq total) by mouth 2 (two) times daily. 01/23/13  Yes Montine Circle, PA-C  tiotropium (SPIRIVA HANDIHALER) 18 MCG inhalation capsule Place 1 capsule (18 mcg total) into inhaler and inhale daily. 06/22/13  Yes Leota Jacobsen, MD   BP 126/86  Pulse 59  Temp(Src) 97.4 F (36.3 C) (Oral)  Resp 21  Ht 5\' 7"  (1.702 m)  Wt 126  lb (57.153 kg)  BMI 19.73 kg/m2  SpO2 98% Physical Exam CONSTITUTIONAL: Well developed/well nourished HEAD: Normocephalic/atraumatic EYES: EOMI/PERRL ENMT: Mucous membranes moist NECK: supple no meningeal signs SPINE:entire spine nontender CV: S1/S2 noted, no murmurs/rubs/gallops noted LUNGS: scattered wheezing noted, no apparent distress ABDOMEN: soft, nontender, no rebound or guarding GU:no cva tenderness NEURO: Pt is awake/alert, moves all extremitiesx4 EXTREMITIES: pulses normal, full ROM, no LE edema noted SKIN: warm, color normal PSYCH: no abnormalities of mood noted  ED Course  Procedures  CRITICAL CARE Performed by:  Sharyon Cable Total critical care time: 31 Critical care time was exclusive of separately billable procedures and treating other patients. Critical care was necessary to treat or prevent imminent or life-threatening deterioration. Critical care was time spent personally by me on the following activities: development of treatment plan with patient and/or surrogate as well as nursing, discussions with consultants, evaluation of patient's response to treatment, examination of patient, obtaining history from patient or surrogate, ordering and performing treatments and interventions, ordering and review of laboratory studies, ordering and review of radiographic studies, pulse oximetry and re-evaluation of patient's condition. PATIENT REQUIRED IV CARDIZEM DUE TO PERSISTENT TACHYCARDIA (HR>130)  7:59 PM Pt with h/o paroxysmal atrial fibrillation, likely exacerbated by nebulizers She takes daily ASA  10:25 PM Pt with persistent afib with RVR despite cardizem drip Pt will need admission for better rate control D/w dr Hal Hope will admit to stepdown Pt will also need monitoring of her COPD Labs Review Labs Reviewed  BASIC METABOLIC PANEL - Abnormal; Notable for the following:    Potassium 3.3 (*)    Glucose, Bld 100 (*)    Calcium 10.7 (*)    GFR calc non Af Amer 88 (*)    All other components within normal limits  CBC  I-STAT TROPOININ, ED    Imaging Review Dg Chest 2 View  06/06/2014   CLINICAL DATA:  Cough and congestion for 2 weeks, chest pain. No fever. Asthma. Prior smoker.  EXAM: CHEST  2 VIEW  COMPARISON:  Chest radiograph February 01, 2014  FINDINGS: Cardiomediastinal silhouette is unchanged, minimally calcified aortic knob. The lungs are clear without pleural effusions or focal consolidations. Increased lung volumes with mildly flattened hemidiaphragms, similar. Trachea projects midline and there is no pneumothorax. Soft tissue planes and included osseous structures are  non-suspicious.  IMPRESSION: COPD without superimposed acute cardiopulmonary process.   Electronically Signed   By: Elon Alas   On: 06/06/2014 15:37     EKG Interpretation   Date/Time:  Thursday June 06 2014 14:28:42 EDT Ventricular Rate:  76 PR Interval:  102 QRS Duration: 80 QT Interval:  392 QTC Calculation: 441 R Axis:   75 Text Interpretation:  Sinus rhythm with short PR Otherwise normal ECG  artifact noted No significant change since last tracing Confirmed by  Christy Gentles  MD, [place] ([id number]) on 06/06/2014 6:36:04 PM      EKG Interpretation  Date/Time:  Thursday June 06 2014 19:45:03 EDT Ventricular Rate:  162 PR Interval:  102 QRS Duration: 83 QT Interval:  337 QTC Calculation: 553 R Axis:   70 Text Interpretation:  Atrial fibrillation with rapid V-rate Paired ventricular premature complexes RSR' in V1 or V2, probably normal variant changed from prior, now in atrial fibrillation Confirmed by Christy Gentles  MD, [place]en[place] Rota ([id number]) on 06/06/2014 7:56:27 PM       MDM   Final diagnoses:  Atrial fibrillation with rapid ventricular response  COPD exacerbation    Nursing notes including past medical history  and social history reviewed and considered in documentation Previous records reviewed and considered xrays reviewed and considered Labs/vital reviewed and considered     Sharyon Cable, MD 06/06/14 2229

## 2014-06-06 NOTE — H&P (Signed)
Hospitalists History and Physical  TERICA YOGI SFK:[id number] DOB: 1950-09-11 DOA: 06/06/2014  Referring physician: ER physician. PCP: Kevan Ny, MD   Chief Complaint: Shortness of breath.  HPI: [name] is a 63 y.o. female history of COPD, atrial fibrillation, hypertension presents to the ER because of shortness of breath. Patient states that he has had some migraine last week and was following up with her primary care physician today and had gone home following which patient started feeling palpitations and shortness of breath. In the ER patient was found to be wheezing emergency placed on nebulizer following which patient was found to be in A. fib with RVR for which patient was started on Cardizem infusion. Patient has had previous history of atrial fibrillation but presently patient is not on any rate limiting medications. Patient states she has been having some chest pain which is mostly on coughing. Patient has been admitted for further management. Patient otherwise denies any nausea vomiting abdominal pain headache diarrhea fever chills.   Review of Systems: As presented in the history of presenting illness, rest negative.  Past Medical History  Diagnosis Date  . Asthma   . Hypertension   . GERD (gastroesophageal reflux disease)   . A-fib   . Migraine    Past Surgical History  Procedure Laterality Date  . Tubal ligation     Social History:  reports that she quit smoking about 2 years ago. Her smoking use included Cigarettes. She smoked 0.00 packs per day. She has never used smokeless tobacco. She reports that she does not drink alcohol or use illicit drugs. Where does patient live home. Can patient participate in ADLs? Yes.  Allergies  Allergen Reactions  . Pantoprazole Nausea And Vomiting and Other (See Comments)    dizziness  . Celecoxib Rash    Family History:  Family History  Problem Relation Age of Onset  . Cancer Mother   . Cancer Father   . Heart  attack Mother 80      Prior to Admission medications   Medication Sig Start Date End Date Taking? Authorizing Provider  acetaminophen (TYLENOL) 500 MG tablet Take 1,000 mg by mouth every 6 (six) hours as needed for headache.   Yes Historical Provider, MD  albuterol (PROVENTIL HFA;VENTOLIN HFA) 108 (90 BASE) MCG/ACT inhaler Inhale 2 puffs into the lungs every 4 (four) hours as needed for wheezing or shortness of breath. 12/06/12  Yes Montine Circle, PA-C  cetirizine (ZYRTEC) 10 MG tablet Take 10 mg by mouth daily as needed for allergies.   Yes Historical Provider, MD  hydrochlorothiazide (HYDRODIURIL) 25 MG tablet Take 25 mg by mouth daily.   Yes Historical Provider, MD  HYDROcodone-acetaminophen (NORCO) 5-325 MG per tablet Take 1-2 tablets by mouth every 6 (six) hours as needed for moderate pain or severe pain. 05/23/14  Yes Orlie Dakin, MD  lisinopril (PRINIVIL,ZESTRIL) 10 MG tablet Take 10 mg by mouth daily.   Yes Historical Provider, MD  metoCLOPramide (REGLAN) 10 MG tablet Take 1 tablet (10 mg total) by mouth every 6 (six) hours as needed for nausea (nausea/headache). 05/23/14  Yes Orlie Dakin, MD  potassium chloride SA (K-DUR,KLOR-CON) 20 MEQ tablet Take 1 tablet (20 mEq total) by mouth 2 (two) times daily. 01/23/13  Yes Montine Circle, PA-C  tiotropium (SPIRIVA HANDIHALER) 18 MCG inhalation capsule Place 1 capsule (18 mcg total) into inhaler and inhale daily. 06/22/13  Yes Leota Jacobsen, MD    Physical Exam: Filed Vitals:   06/06/14 2200 06/06/14  2304 06/06/14 2306 06/06/14 2310  BP: 131/93   132/87  Pulse: 51     Temp:      TempSrc:      Resp: 20 22 16 25   Height:      Weight:      SpO2: 96%        General:  Moderately built and nourished.  Eyes: Anicteric no pallor.  ENT: No discharge from ears eyes nose mouth.  Neck: No mass felt.  Cardiovascular: S1-S2 heard. Tachycardic.  Respiratory: No rhonchi or crepitations.  Abdomen: Soft nontender bowel sounds present.  No guarding rigidity.  Skin: No rash.  Musculoskeletal: No edema.  Psychiatric: Appears normal.  Neurologic: Alert and oriented to time place and person. Moves all extremities.  Labs on Admission:  Basic Metabolic Panel:  Recent Labs Lab 06/06/14 1441  NA 143  K 3.3*  CL 102  CO2 28  GLUCOSE 100*  BUN 12  CREATININE 0.73  CALCIUM 10.7*   Liver Function Tests: No results found for this basename: AST, ALT, ALKPHOS, BILITOT, PROT, ALBUMIN,  in the last 168 hours No results found for this basename: LIPASE, AMYLASE,  in the last 168 hours No results found for this basename: AMMONIA,  in the last 168 hours CBC:  Recent Labs Lab 06/06/14 1441  WBC 5.7  HGB 13.6  HCT 41.0  MCV 81.5  PLT 224   Cardiac Enzymes: No results found for this basename: CKTOTAL, CKMB, CKMBINDEX, TROPONINI,  in the last 168 hours  BNP (last 3 results) No results found for this basename: PROBNP,  in the last 8760 hours CBG: No results found for this basename: GLUCAP,  in the last 168 hours  Radiological Exams on Admission: Dg Chest 2 View  06/06/2014   CLINICAL DATA:  Cough and congestion for 2 weeks, chest pain. No fever. Asthma. Prior smoker.  EXAM: CHEST  2 VIEW  COMPARISON:  Chest radiograph February 01, 2014  FINDINGS: Cardiomediastinal silhouette is unchanged, minimally calcified aortic knob. The lungs are clear without pleural effusions or focal consolidations. Increased lung volumes with mildly flattened hemidiaphragms, similar. Trachea projects midline and there is no pneumothorax. Soft tissue planes and included osseous structures are non-suspicious.  IMPRESSION: COPD without superimposed acute cardiopulmonary process.   Electronically Signed   By: Elon Alas   On: 06/06/2014 15:37    EKG: Independently reviewed. A. fib with RVR.  Assessment/Plan Active Problems:   Atrial fibrillation with rapid ventricular response   COPD exacerbation   Uncontrolled hypertension   Atrial  fibrillation with RVR   1. Atrial fibrillation with RVR - patient has been placed on Cardizem infusion and I have placed patient on Cardizem CD 120 mg following which we will try to wean off Cardizem infusion. Check thyroid function tests and cardiac markers and check 2-D echo. Patient's CHADS2 VASC score is only one so patient is placed on aspirin. 2. Pleuritic table chest pain - probably related to #1 and also could be related to bronchitis. Cycle cardiac markers and also check d-dimer. 3. COPD exacerbation - continue nebulizer and Pulmicort with doxycycline. 4. Hypertension uncontrolled - closely follow blood pressure trends. Continue lisinopril and presently on Cardizem but will DC HCTZ due to hypercalcemia. 5. Hypercalcemia - probably related to HCTZ. DC HCTZ. Closely follow calcium levels. Check parathormone and vitamin D levels.    Code Status: Full code.  Family Communication: None.  Disposition Plan: Admit to inpatient.    KAKRAKANDY,ARSHAD N. [facility] Pager [phone].  If 7PM-7AM, please contact night-coverage [email or web address] Password [email or web address] 06/06/2014, 11:58 PM

## 2014-06-06 NOTE — ED Notes (Signed)
Pt ambulated in hallway to restroom and back.

## 2014-06-06 NOTE — ED Notes (Addendum)
Patient states SOB since this afternoon.   Patient states that she has had a cold but had not had trouble until today.   Patient has productive cough with white sputum.   Patient denies other symptoms.   Patient states chest pain that "I think is because I had coughed".   Patient talking in full sentences.   NAD.  Patient states she is currently having chest pain mid chest with no radiation.

## 2014-06-07 ENCOUNTER — Encounter (HOSPITAL_COMMUNITY): Payer: Self-pay | Admitting: *Deleted

## 2014-06-07 DIAGNOSIS — I369 Nonrheumatic tricuspid valve disorder, unspecified: Secondary | ICD-10-CM

## 2014-06-07 LAB — CREATININE, SERUM: Creatinine, Ser: 0.6 mg/dL (ref 0.50–1.10)

## 2014-06-07 LAB — CBC
HEMATOCRIT: 38.6 % (ref 36.0–46.0)
HEMOGLOBIN: 13.2 g/dL (ref 12.0–15.0)
MCH: 27.5 pg (ref 26.0–34.0)
MCHC: 34.2 g/dL (ref 30.0–36.0)
MCV: 80.4 fL (ref 78.0–100.0)
Platelets: 222 10*3/uL (ref 150–400)
RBC: 4.8 MIL/uL (ref 3.87–5.11)
RDW: 15 % (ref 11.5–15.5)
WBC: 4.8 10*3/uL (ref 4.0–10.5)

## 2014-06-07 LAB — TSH: TSH: 0.95 u[IU]/mL (ref 0.350–4.500)

## 2014-06-07 LAB — TROPONIN I
Troponin I: <0.3 ng/mL (ref <0.30)
Troponin I: <0.3 ng/mL (ref <0.30)
Troponin I: <0.3 ng/mL (ref <0.30)

## 2014-06-07 LAB — T3, FREE: T3 FREE: 3.3 pg/mL (ref 2.3–4.2)

## 2014-06-07 LAB — MRSA PCR SCREENING: MRSA by PCR: NEGATIVE

## 2014-06-07 LAB — T4, FREE: Free T4: 1.13 ng/dL (ref 0.80–1.80)

## 2014-06-07 LAB — PRO B NATRIURETIC PEPTIDE: PRO B NATRI PEPTIDE: 202.1 pg/mL — AB (ref 0–125)

## 2014-06-07 MED ORDER — FLUTICASONE PROPIONATE HFA 110 MCG/ACT IN AERO: 1.0000 | INHALATION_SPRAY | Freq: Two times a day (BID) | RESPIRATORY_TRACT | Status: DC

## 2014-06-07 MED ORDER — METOCLOPRAMIDE HCL 10 MG PO TABS: 10.0000 mg | ORAL_TABLET | Freq: Four times a day (QID) | ORAL | Status: DC | PRN

## 2014-06-07 MED ORDER — ACETAMINOPHEN 650 MG RE SUPP: 650.0000 mg | Freq: Four times a day (QID) | RECTAL | Status: DC | PRN

## 2014-06-07 MED ORDER — BENZONATATE 100 MG PO CAPS: 100.0000 mg | ORAL_CAPSULE | Freq: Three times a day (TID) | ORAL | Status: DC | PRN

## 2014-06-07 MED ORDER — IPRATROPIUM BROMIDE 0.02 % IN SOLN: 0.5000 mg | RESPIRATORY_TRACT | Status: DC

## 2014-06-07 MED ORDER — IPRATROPIUM BROMIDE 0.02 % IN SOLN
0.5000 mg | Freq: Two times a day (BID) | RESPIRATORY_TRACT | Status: DC
  Administered 2014-06-07: 0.5 mg via RESPIRATORY_TRACT
  Filled 2014-06-07 (×2): qty 2.5

## 2014-06-07 MED ORDER — APIXABAN 5 MG PO TABS
5.0000 mg | ORAL_TABLET | Freq: Two times a day (BID) | ORAL | Status: DC
  Administered 2014-06-07: 5 mg via ORAL
  Filled 2014-06-07: qty 1

## 2014-06-07 MED ORDER — DILTIAZEM HCL 100 MG IV SOLR
5.0000 mg/h | INTRAVENOUS | Status: DC
  Filled 2014-06-07: qty 100

## 2014-06-07 MED ORDER — ASPIRIN EC 325 MG PO TBEC
325.0000 mg | DELAYED_RELEASE_TABLET | Freq: Every day | ORAL | Status: DC
  Administered 2014-06-07: 325 mg via ORAL
  Filled 2014-06-07: qty 1

## 2014-06-07 MED ORDER — APIXABAN 5 MG PO TABS: 5.0000 mg | ORAL_TABLET | Freq: Two times a day (BID) | ORAL | Status: DC

## 2014-06-07 MED ORDER — LEVALBUTEROL HCL 0.63 MG/3ML IN NEBU: 0.6300 mg | INHALATION_SOLUTION | Freq: Two times a day (BID) | RESPIRATORY_TRACT | Status: DC

## 2014-06-07 MED ORDER — DILTIAZEM HCL 100 MG IV SOLR
5.0000 mg/h | Freq: Once | INTRAVENOUS | Status: AC
  Administered 2014-06-07: 15 mg/h via INTRAVENOUS
  Filled 2014-06-07: qty 100

## 2014-06-07 MED ORDER — SALMETEROL XINAFOATE 50 MCG/DOSE IN AEPB: 1.0000 | INHALATION_SPRAY | Freq: Two times a day (BID) | RESPIRATORY_TRACT | Status: DC

## 2014-06-07 MED ORDER — BENZONATATE 100 MG PO CAPS
100.0000 mg | ORAL_CAPSULE | Freq: Three times a day (TID) | ORAL | Status: DC | PRN
  Administered 2014-06-07: 100 mg via ORAL
  Filled 2014-06-07: qty 1

## 2014-06-07 MED ORDER — BUDESONIDE 0.25 MG/2ML IN SUSP
0.2500 mg | Freq: Two times a day (BID) | RESPIRATORY_TRACT | Status: DC
  Administered 2014-06-07: 0.25 mg via RESPIRATORY_TRACT
  Filled 2014-06-07 (×4): qty 2

## 2014-06-07 MED ORDER — ENOXAPARIN SODIUM 40 MG/0.4ML ~~LOC~~ SOLN
40.0000 mg | SUBCUTANEOUS | Status: DC
  Administered 2014-06-07: 40 mg via SUBCUTANEOUS
  Filled 2014-06-07: qty 0.4

## 2014-06-07 MED ORDER — SODIUM CHLORIDE 0.9 % IJ SOLN
3.0000 mL | Freq: Two times a day (BID) | INTRAMUSCULAR | Status: DC
  Administered 2014-06-07: 3 mL via INTRAVENOUS

## 2014-06-07 MED ORDER — LISINOPRIL 10 MG PO TABS
10.0000 mg | ORAL_TABLET | Freq: Every day | ORAL | Status: DC
Start: 2014-06-07 — End: 2014-06-07
  Administered 2014-06-07: 10 mg via ORAL
  Filled 2014-06-07: qty 1

## 2014-06-07 MED ORDER — POTASSIUM CHLORIDE CRYS ER 20 MEQ PO TBCR
20.0000 meq | EXTENDED_RELEASE_TABLET | Freq: Two times a day (BID) | ORAL | Status: DC
  Administered 2014-06-07 (×2): 20 meq via ORAL
  Filled 2014-06-07 (×3): qty 1

## 2014-06-07 MED ORDER — POTASSIUM CHLORIDE CRYS ER 20 MEQ PO TBCR
40.0000 meq | EXTENDED_RELEASE_TABLET | Freq: Once | ORAL | Status: AC
  Administered 2014-06-07: 40 meq via ORAL
  Filled 2014-06-07: qty 2

## 2014-06-07 MED ORDER — LEVALBUTEROL HCL 0.63 MG/3ML IN NEBU: 0.6300 mg | INHALATION_SOLUTION | Freq: Four times a day (QID) | RESPIRATORY_TRACT | Status: DC

## 2014-06-07 MED ORDER — ACETAMINOPHEN 325 MG PO TABS: 650.0000 mg | ORAL_TABLET | Freq: Four times a day (QID) | ORAL | Status: DC | PRN

## 2014-06-07 MED ORDER — ONDANSETRON HCL 4 MG/2ML IJ SOLN: 4.0000 mg | Freq: Four times a day (QID) | INTRAMUSCULAR | Status: DC | PRN

## 2014-06-07 MED ORDER — LEVALBUTEROL HCL 0.63 MG/3ML IN NEBU: 0.6300 mg | INHALATION_SOLUTION | Freq: Four times a day (QID) | RESPIRATORY_TRACT | Status: DC | PRN

## 2014-06-07 MED ORDER — LEVALBUTEROL HCL 0.63 MG/3ML IN NEBU
0.6300 mg | INHALATION_SOLUTION | Freq: Two times a day (BID) | RESPIRATORY_TRACT | Status: DC
  Administered 2014-06-07: 0.63 mg via RESPIRATORY_TRACT
  Filled 2014-06-07 (×4): qty 3

## 2014-06-07 MED ORDER — HYDROCHLOROTHIAZIDE 25 MG PO TABS
25.0000 mg | ORAL_TABLET | Freq: Every day | ORAL | Status: DC
Start: 2014-06-07 — End: 2014-06-07
  Filled 2014-06-07: qty 1

## 2014-06-07 MED ORDER — HYDROCODONE-ACETAMINOPHEN 5-325 MG PO TABS
1.0000 | ORAL_TABLET | Freq: Four times a day (QID) | ORAL | Status: DC | PRN
  Administered 2014-06-07: 1 via ORAL
  Filled 2014-06-07: qty 1

## 2014-06-07 MED ORDER — DILTIAZEM HCL ER COATED BEADS 120 MG PO CP24
120.0000 mg | ORAL_CAPSULE | Freq: Every day | ORAL | Status: DC
  Administered 2014-06-07 (×2): 120 mg via ORAL
  Filled 2014-06-07 (×2): qty 1

## 2014-06-07 MED ORDER — LORATADINE 10 MG PO TABS
10.0000 mg | ORAL_TABLET | Freq: Every day | ORAL | Status: DC
  Administered 2014-06-07: 10 mg via ORAL
  Filled 2014-06-07: qty 1

## 2014-06-07 MED ORDER — DOXYCYCLINE HYCLATE 100 MG PO TABS
100.0000 mg | ORAL_TABLET | Freq: Two times a day (BID) | ORAL | Status: DC
  Filled 2014-06-07 (×3): qty 1

## 2014-06-07 MED ORDER — DILTIAZEM HCL ER COATED BEADS 120 MG PO CP24: 120.0000 mg | ORAL_CAPSULE | Freq: Every day | ORAL | Status: DC

## 2014-06-07 MED ORDER — ONDANSETRON HCL 4 MG PO TABS: 4.0000 mg | ORAL_TABLET | Freq: Four times a day (QID) | ORAL | Status: DC | PRN

## 2014-06-07 NOTE — Discharge Summary (Signed)
DISCHARGE SUMMARY  [name]  MR#: [id number]  DOB:1950/09/19  Date of Admission: 06/06/2014 Date of Discharge: 06/07/2014  Attending Physician:[name]  Patient's INO:MVEH, [name]  Consults:  none   Disposition: d/c home   Follow-up Appts:     Follow-up Information   Follow up with Shelby Sa, [name]. Schedule an appointment as soon as possible for a visit in 5 days.   Specialty:  Internal Medicine   Contact information:   [facility] [place] [address] [phone]       Tests Needing Follow-up: -recheck of Ca level is suggested -recheck CBC is suggested w/ initiation of Eliquis  -recheck of heart rate in setting of afib -refills of new prescriptions will be required, to include Eliquis   Discharge Diagnoses: Chronic Parox Afib w/ acute RVR  Chronic bronchitis - COPD w/ acute exacerbation Pleuritic chest pain  HTN  Hypercalcemia  GERD  Initial presentation: 63 y.o. female w/ history of COPD, atrial fibrillation, and hypertension, who presented to the ER because of shortness of breath. Patient started feeling palpitations and shortness of breath. In the ER patient was found to be in A. fib with RVR for which patient was started on Cardizem infusion. Patient has chronic atrial fibrillation but was not on any rate limiting medications.   Hospital Course: The patient quickly improved from the standpoint of her SOB with neb txs.  This however, likely contributed to her tachycardia in the setting of chronic Afib.  She required a short tx period w/ a cardizem gtt, but her HR rapidly became controlled, and she was able to be transitioned to oral tx.  She had no further complications during her stay.  She was very motivated to be d/c home.  She was counseled on the need to initiate anticoag, and the risks associated w/ this.  She agreed.  She was counseled to never smoke again, and to f/u w/ her PCP withiin 5 days to  assure her HR remains well controlled and that she is tolerating her anticoag w/o difficulty.    Chronic Parox Afib w/ acute RVR  back in NSR the morning after her admit - not previously on rate control agents or anticoag - CHADS2 VASC score is 3 (female, 63yo, HTN) - will initiate anticoag w/ Eliquis - asked CM to investigate copay/costs - acute episode may well be related to use of nebs in ER for wheezing - TSH is normal   Pleuritic chest pain  Troponin negative x3 - sx not c/w angina   HTN  BP currently well controlled   Chronic bronchitis - COPD w/ acute exacerbation Resume usual home nebs/MDI tx regimen - hopeful that concomitant use of rate controlling meds will prevent RVR - if RVR recurrs, will have to consider transition to different nebs/MDI - no wheezing at time of d/c   Hypercalcemia  Felt to be due to use of HCTZ / dehydration - f/u in outpt setting   GERD     Medication List    STOP taking these medications       hydrochlorothiazide 25 MG tablet  Commonly known as:  HYDRODIURIL     potassium chloride SA 20 MEQ tablet  Commonly known as:  K-DUR,KLOR-CON      TAKE these medications       acetaminophen 500 MG tablet  Commonly known as:  TYLENOL  Take 1,000 mg by mouth every 6 (six) hours as needed for headache.  albuterol 108 (90 BASE) MCG/ACT inhaler  Commonly known as:  PROVENTIL HFA;VENTOLIN HFA  Inhale 2 puffs into the lungs every 4 (four) hours as needed for wheezing or shortness of breath.     apixaban 5 MG Tabs tablet  Commonly known as:  ELIQUIS  Take 1 tablet (5 mg total) by mouth 2 (two) times daily.     benzonatate 100 MG capsule  Commonly known as:  TESSALON  Take 1 capsule (100 mg total) by mouth 3 (three) times daily as needed for cough.     cetirizine 10 MG tablet  Commonly known as:  ZYRTEC  Take 10 mg by mouth daily as needed for allergies.     diltiazem 120 MG 24 hr capsule  Commonly known as:  CARDIZEM CD  Take 1 capsule (120 mg  total) by mouth daily.     fluticasone 110 MCG/ACT inhaler  Commonly known as:  FLOVENT HFA  Inhale 1 puff into the lungs 2 (two) times daily.     HYDROcodone-acetaminophen 5-325 MG per tablet  Commonly known as:  NORCO  Take 1-2 tablets by mouth every 6 (six) hours as needed for moderate pain or severe pain.     lisinopril 10 MG tablet  Commonly known as:  PRINIVIL,ZESTRIL  Take 10 mg by mouth daily.     metoCLOPramide 10 MG tablet  Commonly known as:  REGLAN  Take 1 tablet (10 mg total) by mouth every 6 (six) hours as needed for nausea (nausea/headache).     salmeterol 50 MCG/DOSE diskus inhaler  Commonly known as:  SEREVENT DISKUS  Inhale 1 puff into the lungs 2 (two) times daily.     tiotropium 18 MCG inhalation capsule  Commonly known as:  SPIRIVA HANDIHALER  Place 1 capsule (18 mcg total) into inhaler and inhale daily.       Day of Discharge BP 111/82  Pulse 76  Temp(Src) 97.8 F (36.6 C) (Oral)  Resp 24  Ht 5\' 7"  (1.702 m)  Wt 55.7 kg (122 lb 12.7 oz)  BMI 19.23 kg/m2  SpO2 96%  Physical Exam: General: No acute respiratory distress Lungs: Clear to auscultation bilaterally without wheezes or crackles Cardiovascular: Regular rate and rhythm without murmur gallop or rub - NSR at time of d/c  Abdomen: Nontender, nondistended, soft, bowel sounds positive, no rebound, no ascites, no appreciable mass Extremities: No significant cyanosis, clubbing, or edema bilateral lower extremities  Basic Metabolic Panel:  Recent Labs Lab 06/06/14 1441 06/07/14 0100  NA 143  --   K 3.3*  --   CL 102  --   CO2 28  --   GLUCOSE 100*  --   BUN 12  --   CREATININE 0.73 0.60  CALCIUM 10.7*  --     CBC:  Recent Labs Lab 06/06/14 1441 06/07/14 0100  WBC 5.7 4.8  HGB 13.6 13.2  HCT 41.0 38.6  MCV 81.5 80.4  PLT 224 222    Cardiac Enzymes:  Recent Labs Lab 06/07/14 0100 06/07/14 0630 06/07/14 1109  TROPONINI <0.30 <0.30 <0.30   BNP (last 3  results)  Recent Labs  06/07/14 0100  PROBNP 202.1*    Recent Results (from the past 240 hour(s))  MRSA PCR SCREENING     Status: None   Collection Time    06/07/14 12:15 AM      Result Value Ref Range Status   MRSA by PCR NEGATIVE  NEGATIVE Final   Comment:  The GeneXpert MRSA Assay (FDA     approved for NASAL specimens     only), is one component of a     comprehensive MRSA colonization     surveillance program. It is not     intended to diagnose MRSA     infection nor to guide or     monitor treatment for     MRSA infections.      Time spent in discharge (includes decision making & examination of pt): 30 minutes  06/07/2014, 5:32 PM   Cherene Altes, Bartlett [facility] Office  [phone] Pager [phone]  On-Call/Text Page:      [email or web address]      password [facility]

## 2014-06-07 NOTE — Progress Notes (Signed)
D/c instructions reviewed with pt, education provided on diagnosis. Copy of information and instructions and scripts given to pt. Pt d/c'd via wheelchair with belongings with family, escorted by unit NT.

## 2014-06-07 NOTE — Progress Notes (Addendum)
Pt has had dry cough off and on all day, pt states "feels like phlegm in my throat, but I can't get it up", pt wants med to help cough. Has faint expiratory wheeze on right.  Message sent to Dr Thereasa Solo inquiring about med for cough or consider if ace inhibitor pt is on may be causing dry cough.

## 2014-06-07 NOTE — Progress Notes (Signed)
  Echocardiogram 2D Echocardiogram has been performed.  Dail Lerew 06/07/2014, 12:30 PM

## 2014-06-07 NOTE — Discharge Instructions (Signed)
Apixaban oral tablets What is this medicine? APIXABAN (a PIX a ban) is an anticoagulant (blood thinner). It is used to lower the chance of stroke in people with a medical condition called atrial fibrillation. It is also used to treat or prevent blood clots in the lungs or in the veins. This medicine may be used for other purposes; ask your health care provider or pharmacist if you have questions. COMMON BRAND NAME(S): Eliquis What should I tell my health care provider before I take this medicine? They need to know if you have any of these conditions: -bleeding disorders -bleeding in the brain -blood in your stools (black or tarry stools) or if you have blood in your vomit -history of stomach bleeding -kidney disease -liver disease -mechanical heart valve -an unusual or allergic reaction to apixaban, other medicines, foods, dyes, or preservatives -pregnant or trying to get pregnant -breast-feeding How should I use this medicine? Take this medicine by mouth with a glass of water. Follow the directions on the prescription label. You can take it with or without food. If it upsets your stomach, take it with food. Take your medicine at regular intervals. Do not take it more often than directed. Do not stop taking except on your doctor's advice. Stopping this medicine may increase your risk of a blot clot. Be sure to refill your prescription before you run out of medicine. Talk to your pediatrician regarding the use of this medicine in children. Special care may be needed. Overdosage: If you think you have taken too much of this medicine contact a poison control center or emergency room at once. NOTE: This medicine is only for you. Do not share this medicine with others. What if I miss a dose? If you miss a dose, take it as soon as you can. If it is almost time for your next dose, take only that dose. Do not take double or extra doses. What may interact with this medicine? This medicine may  interact with the following: -aspirin and aspirin-like medicines -certain medicines for fungal infections like ketoconazole and itraconazole -certain medicines for seizures like carbamazepine and phenytoin -certain medicines that treat or prevent blood clots like warfarin, enoxaparin, and dalteparin -clarithromycin -NSAIDs, medicines for pain and inflammation, like ibuprofen or naproxen -rifampin -ritonavir -St. John's wort This list may not describe all possible interactions. Give your health care provider a list of all the medicines, herbs, non-prescription drugs, or dietary supplements you use. Also tell them if you smoke, drink alcohol, or use illegal drugs. Some items may interact with your medicine. What should I watch for while using this medicine? Notify your doctor or health care professional and seek emergency treatment if you develop breathing problems; changes in vision; chest pain; severe, sudden headache; pain, swelling, warmth in the leg; trouble speaking; sudden numbness or weakness of the face, arm, or leg. These can be signs that your condition has gotten worse. If you are going to have surgery, tell your doctor or health care professional that you are taking this medicine. Tell your health care professional that you use this medicine before you have a spinal or epidural procedure. Sometimes people who take this medicine have bleeding problems around the spine when they have a spinal or epidural procedure. This bleeding is very rare. If you have a spinal or epidural procedure while on this medicine, call your health care professional immediately if you have back pain, numbness or tingling (especially in your legs and feet), muscle weakness, paralysis, or loss  of bladder or bowel control. Avoid sports and activities that might cause injury while you are using this medicine. Severe falls or injuries can cause unseen bleeding. Be careful when using sharp tools or knives. Consider using  an Copy. Take special care brushing or flossing your teeth. Report any injuries, bruising, or red spots on the skin to your doctor or health care professional. What side effects may I notice from receiving this medicine? Side effects that you should report to your doctor or health care professional as soon as possible: -allergic reactions like skin rash, itching or hives, swelling of the face, lips, or tongue -signs and symptoms of bleeding such as bloody or black, tarry stools; red or dark-brown urine; spitting up blood or brown material that looks like coffee grounds; red spots on the skin; unusual bruising or bleeding from the eye, gums, or nose This list may not describe all possible side effects. Call your doctor for medical advice about side effects. You may report side effects to FDA at 1-800-FDA-1088. Where should I keep my medicine? Keep out of the reach of children. Store at room temperature between 20 and 25 degrees C (68 and 77 degrees F). Throw away any unused medicine after the expiration date. NOTE: This sheet is a summary. It may not cover all possible information. If you have questions about this medicine, talk to your doctor, pharmacist, or health care provider.  2015, [employer]. (2013-04-13 11:59:24)  Atrial Fibrillation Atrial fibrillation is a type of irregular heart rhythm (arrhythmia). During atrial fibrillation, the upper chambers of the heart (atria) quiver continuously in a chaotic pattern. This causes an irregular and often rapid heart rate.  Atrial fibrillation is the result of the heart becoming overloaded with disorganized signals that tell it to beat. These signals are normally released one at a time by a part of the right atrium called the sinoatrial node. They then travel from the atria to the lower chambers of the heart (ventricles), causing the atria and ventricles to contract and pump blood as they pass. In atrial fibrillation, parts of the  atria outside of the sinoatrial node also release these signals. This results in two problems. First, the atria receive so many signals that they do not have time to fully contract. Second, the ventricles, which can only receive one signal at a time, beat irregularly and out of rhythm with the atria.  There are three types of atrial fibrillation:   Paroxysmal. Paroxysmal atrial fibrillation starts suddenly and stops on its own within a week.  Persistent. Persistent atrial fibrillation lasts for more than a week. It may stop on its own or with treatment.  Permanent. Permanent atrial fibrillation does not go away. Episodes of atrial fibrillation may lead to permanent atrial fibrillation. Atrial fibrillation can prevent your heart from pumping blood normally. It increases your risk of stroke and can lead to heart failure.  CAUSES   Heart conditions, including a heart attack, heart failure, coronary artery disease, and heart valve conditions.   Inflammation of the sac that surrounds the heart (pericarditis).  Blockage of an artery in the lungs (pulmonary embolism).  Pneumonia or other infections.  Chronic lung disease.  Thyroid problems, especially if the thyroid is overactive (hyperthyroidism).  Caffeine, excessive alcohol use, and use of some illegal drugs.   Use of some medicines, including certain decongestants and diet pills.  Heart surgery.   Birth defects.  Sometimes, no cause can be found. When this happens, the atrial fibrillation is  called lone atrial fibrillation. The risk of complications from atrial fibrillation increases if you have lone atrial fibrillation and you are age 25 years or older. RISK FACTORS  Heart failure.  Coronary artery disease.  Diabetes mellitus.   High blood pressure (hypertension).   Obesity.   Other arrhythmias.   Increased age. SIGNS AND SYMPTOMS   A feeling that your heart is beating rapidly or irregularly.   A feeling of  discomfort or pain in your chest.   Shortness of breath.   Sudden light-headedness or weakness.   Getting tired easily when exercising.   Urinating more often than normal (mainly when atrial fibrillation first begins).  In paroxysmal atrial fibrillation, symptoms may start and suddenly stop. DIAGNOSIS  Your health care provider may be able to detect atrial fibrillation when taking your pulse. Your health care provider may have you take a test called an ambulatory electrocardiogram (ECG). An ECG records your heartbeat patterns over a 24-hour period. You may also have other tests, such as:  Transthoracic echocardiogram (TTE). During echocardiography, sound waves are used to evaluate how blood flows through your heart.  Transesophageal echocardiogram (TEE).  Stress test. There is more than one type of stress test. If a stress test is needed, ask your health care provider about which type is best for you.  Chest X-ray exam.  Blood tests.  Computed tomography (CT). TREATMENT  Treatment may include:  Treating any underlying conditions. For example, if you have an overactive thyroid, treating the condition may correct atrial fibrillation.  Taking medicine. Medicines may be given to control a rapid heart rate or to prevent blood clots, heart failure, or a stroke.  Having a procedure to correct the rhythm of the heart:  Electrical cardioversion. During electrical cardioversion, a controlled, low-energy shock is delivered to the heart through your skin. If you have chest pain, very low blood pressure, or sudden heart failure, this procedure may need to be done as an emergency.  Catheter ablation. During this procedure, heart tissues that send the signals that cause atrial fibrillation are destroyed.  Surgical ablation. During this surgery, thin lines of heart tissue that carry the abnormal signals are destroyed. This procedure can either be an open-heart surgery or a minimally  invasive surgery. With the minimally invasive surgery, small cuts are made to access the heart instead of a large opening.  Pulmonary venous isolation. During this surgery, tissue around the veins that carry blood from the lungs (pulmonary veins) is destroyed. This tissue is thought to carry the abnormal signals. HOME CARE INSTRUCTIONS   Take medicines only as directed by your health care provider. Some medicines can make atrial fibrillation worse or recur.  If blood thinners were prescribed by your health care provider, take them exactly as directed. Too much blood-thinning medicine can cause bleeding. If you take too little, you will not have the needed protection against stroke and other problems.  Perform blood tests at home if directed by your health care provider. Perform blood tests exactly as directed.  Quit smoking if you smoke.  Do not drink alcohol.  Do not drink caffeinated beverages such as coffee, soda, and some teas. You may drink decaffeinated coffee, soda, or tea.   Maintain a healthy weight.Do not use diet pills unless your health care provider approves. They may make heart problems worse.   Follow diet instructions as directed by your health care provider.  Exercise regularly as directed by your health care provider.  Keep all follow-up visits as  directed by your health care provider. This is important. PREVENTION  The following substances can cause atrial fibrillation to recur:   Caffeinated beverages.  Alcohol.  Certain medicines, especially those used for breathing problems.  Certain herbs and herbal medicines, such as those containing ephedra or ginseng.  Illegal drugs, such as cocaine and amphetamines. Sometimes medicines are given to prevent atrial fibrillation from recurring. Proper treatment of any underlying condition is also important in helping prevent recurrence.  SEEK MEDICAL CARE IF:  You notice a change in the rate, rhythm, or strength of  your heartbeat.  You suddenly begin urinating more frequently.  You tire more easily when exerting yourself or exercising. SEEK IMMEDIATE MEDICAL CARE IF:   You have chest pain, abdominal pain, sweating, or weakness.  You feel nauseous.  You have shortness of breath.  You suddenly have swollen feet and ankles.  You feel dizzy.  Your face or limbs feel numb or weak.  You have a change in your vision or speech. MAKE SURE YOU:   Understand these instructions.  Will watch your condition.  Will get help right away if you are not doing well or get worse. Document Released: 08/09/2005 Document Revised: 12/24/2013 Document Reviewed: 09/19/2012 [facility] Patient Information 2015 [place], [place]. This information is not intended to replace advice given to you by your health care provider. Make sure you discuss any questions you have with your health care provider.  Chronic Obstructive Pulmonary Disease Chronic obstructive pulmonary disease (COPD) is a common lung condition in which airflow from the lungs is limited. COPD is a general term that can be used to describe many different lung problems that limit airflow, including both chronic bronchitis and emphysema. If you have COPD, your lung function will probably never return to normal, but there are measures you can take to improve lung function and make yourself feel better.  CAUSES   Smoking (common).   Exposure to secondhand smoke.   Genetic problems.  Chronic inflammatory lung diseases or recurrent infections. SYMPTOMS   Shortness of breath, especially with physical activity.   Deep, persistent (chronic) cough with a large amount of thick mucus.   Wheezing.   Rapid breaths (tachypnea).   Gray or bluish discoloration (cyanosis) of the skin, especially in fingers, toes, or lips.   Fatigue.   Weight loss.   Frequent infections or episodes when breathing symptoms become much worse (exacerbations).   Chest  tightness. DIAGNOSIS  Your health care provider will take a medical history and perform a physical examination to make the initial diagnosis. Additional tests for COPD may include:   Lung (pulmonary) function tests.  Chest X-ray.  CT scan.  Blood tests. TREATMENT  Treatment available to help you feel better when you have COPD includes:   Inhaler and nebulizer medicines. These help manage the symptoms of COPD and make your breathing more comfortable.  Supplemental oxygen. Supplemental oxygen is only helpful if you have a low oxygen level in your blood.   Exercise and physical activity. These are beneficial for nearly all people with COPD. Some people may also benefit from a pulmonary rehabilitation program. HOME CARE INSTRUCTIONS   Take all medicines (inhaled or pills) as directed by your health care provider.  Avoid over-the-counter medicines or cough syrups that dry up your airway (such as antihistamines) and slow down the elimination of secretions unless instructed otherwise by your health care provider.   If you are a smoker, the most important thing that you can do is stop  smoking. Continuing to smoke will cause further lung damage and breathing trouble. Ask your health care provider for help with quitting smoking. He or she can direct you to community resources or hospitals that provide support.  Avoid exposure to irritants such as smoke, chemicals, and fumes that aggravate your breathing.  Use oxygen therapy and pulmonary rehabilitation if directed by your health care provider. If you require home oxygen therapy, ask your health care provider whether you should purchase a pulse oximeter to measure your oxygen level at home.   Avoid contact with individuals who have a contagious illness.  Avoid extreme temperature and humidity changes.  Eat healthy foods. Eating smaller, more frequent meals and resting before meals may help you maintain your strength.  Stay active, but  balance activity with periods of rest. Exercise and physical activity will help you maintain your ability to do things you want to do.  Preventing infection and hospitalization is very important when you have COPD. Make sure to receive all the vaccines your health care provider recommends, especially the pneumococcal and influenza vaccines. Ask your health care provider whether you need a pneumonia vaccine.  Learn and use relaxation techniques to manage stress.  Learn and use controlled breathing techniques as directed by your health care provider. Controlled breathing techniques include:   Pursed lip breathing. Start by breathing in (inhaling) through your nose for 1 second. Then, purse your lips as if you were going to whistle and breathe out (exhale) through the pursed lips for 2 seconds.   Diaphragmatic breathing. Start by putting one hand on your abdomen just above your waist. Inhale slowly through your nose. The hand on your abdomen should move out. Then purse your lips and exhale slowly. You should be able to feel the hand on your abdomen moving in as you exhale.   Learn and use controlled coughing to clear mucus from your lungs. Controlled coughing is a series of short, progressive coughs. The steps of controlled coughing are:  1. Lean your head slightly forward.  2. Breathe in deeply using diaphragmatic breathing.  3. Try to hold your breath for 3 seconds.  4. Keep your mouth slightly open while coughing twice.  5. Spit any mucus out into a tissue.  6. Rest and repeat the steps once or twice as needed. SEEK MEDICAL CARE IF:   You are coughing up more mucus than usual.   There is a change in the color or thickness of your mucus.   Your breathing is more labored than usual.   Your breathing is faster than usual.  SEEK IMMEDIATE MEDICAL CARE IF:   You have shortness of breath while you are resting.   You have shortness of breath that prevents you from:  Being  able to talk.   Performing your usual physical activities.   You have chest pain lasting longer than 5 minutes.   Your skin color is more cyanotic than usual.  You measure low oxygen saturations for longer than 5 minutes with a pulse oximeter. MAKE SURE YOU:   Understand these instructions.  Will watch your condition.  Will get help right away if you are not doing well or get worse. Document Released: 05/19/2005 Document Revised: 12/24/2013 Document Reviewed: 04/05/2013 [facility] Patient Information 2015 [place], [place]. This information is not intended to replace advice given to you by your health care provider. Make sure you discuss any questions you have with your health care provider.

## 2014-06-07 NOTE — Care Management Note (Addendum)
    Page 1 of 1   06/07/2014     2:28:59 PM CARE MANAGEMENT NOTE 06/07/2014  Patient:  [name]   Account Number:  [id number]  Date Initiated:  06/07/2014  Documentation initiated by:  Elissa Hefty  Subjective/Objective Assessment:   adm w at fib     Action/Plan:   lives w fam, pcp dr e dean   Anticipated DC Date:     Anticipated DC Plan:           Choice offered to / List presented to:             Status of service:   Medicare Important Message given?   (If response is "NO", the following Medicare IM given date fields will be blank) Date Medicare IM given:   Medicare IM given by:   Date Additional Medicare IM given:   Additional Medicare IM given by:    Discharge Disposition:    Per UR Regulation:  Reviewed for med. necessity/level of care/duration of stay  If discussed at [place] of Stay Meetings, dates discussed:    Comments:  10/16 1334 debbie dowellrn,bsn cm sec checked w ins about copay for xarelto, pradaxa or eliquis. each of these has 37.00 per month copay.no prior auth req. pt states she has mediciad and her copay will be 3.00 per month. gave her eliquis and xarelto 30day free cards since not sure which she will go home with.

## 2014-06-07 NOTE — Plan of Care (Addendum)
Problem: Phase II Progression Outcomes Goal: Ventricular heart rate < 100/min Outcome: Progressing Patient converted to NSR prior to admission. cardizem drip still infusing, will wean off after PO cardizem given.    IV Cardizem drip off since 2am. Still in sinus rhythm. Denies chest pain

## 2014-06-07 NOTE — ED Notes (Signed)
Pt converted to NSR EKG captured and exported. Dr. Hal Hope notified.

## 2014-06-10 LAB — PARATHYROID HORMONE, INTACT (NO CA): PTH: 30 pg/mL (ref 14–64)

## 2014-06-11 LAB — VITAMIN D 1,25 DIHYDROXY
VITAMIN D 1, 25 (OH) TOTAL: 110 pg/mL — AB (ref 18–72)
Vitamin D2 1, 25 (OH)2: <8 pg/mL
Vitamin D3 1, 25 (OH)2: 110 pg/mL

## 2014-06-16 ENCOUNTER — Telehealth (HOSPITAL_BASED_OUTPATIENT_CLINIC_OR_DEPARTMENT_OTHER): Payer: Self-pay | Admitting: Emergency Medicine

## 2014-07-07 ENCOUNTER — Emergency Department (HOSPITAL_COMMUNITY): Payer: Medicare Other

## 2014-07-07 ENCOUNTER — Encounter (HOSPITAL_COMMUNITY): Payer: Self-pay

## 2014-07-07 ENCOUNTER — Emergency Department (HOSPITAL_COMMUNITY)
Admission: EM | Admit: 2014-07-07 | Discharge: 2014-07-07 | Disposition: A | Discharge status: Home / Self Care | Payer: Medicare Other | Attending: Emergency Medicine | Admitting: Emergency Medicine

## 2014-07-07 DIAGNOSIS — I10 Essential (primary) hypertension: Secondary | ICD-10-CM | POA: Diagnosis not present

## 2014-07-07 DIAGNOSIS — J441 Chronic obstructive pulmonary disease with (acute) exacerbation: Secondary | ICD-10-CM | POA: Diagnosis not present

## 2014-07-07 DIAGNOSIS — Z87891 Personal history of nicotine dependence: Secondary | ICD-10-CM | POA: Diagnosis not present

## 2014-07-07 DIAGNOSIS — Z7951 Long term (current) use of inhaled steroids: Secondary | ICD-10-CM | POA: Diagnosis not present

## 2014-07-07 DIAGNOSIS — I4891 Unspecified atrial fibrillation: Secondary | ICD-10-CM | POA: Diagnosis not present

## 2014-07-07 DIAGNOSIS — J45901 Unspecified asthma with (acute) exacerbation: Secondary | ICD-10-CM

## 2014-07-07 DIAGNOSIS — Z7902 Long term (current) use of antithrombotics/antiplatelets: Secondary | ICD-10-CM | POA: Insufficient documentation

## 2014-07-07 DIAGNOSIS — K219 Gastro-esophageal reflux disease without esophagitis: Secondary | ICD-10-CM | POA: Diagnosis not present

## 2014-07-07 DIAGNOSIS — Z79899 Other long term (current) drug therapy: Secondary | ICD-10-CM | POA: Diagnosis not present

## 2014-07-07 DIAGNOSIS — G43909 Migraine, unspecified, not intractable, without status migrainosus: Secondary | ICD-10-CM | POA: Insufficient documentation

## 2014-07-07 DIAGNOSIS — J45909 Unspecified asthma, uncomplicated: Secondary | ICD-10-CM | POA: Diagnosis present

## 2014-07-07 MED ORDER — PREDNISONE 20 MG PO TABS
40.0000 mg | ORAL_TABLET | Freq: Once | ORAL | Status: AC
  Administered 2014-07-07: 40 mg via ORAL
  Filled 2014-07-07: qty 2

## 2014-07-07 MED ORDER — ALBUTEROL SULFATE (2.5 MG/3ML) 0.083% IN NEBU: 2.5000 mg | INHALATION_SOLUTION | Freq: Four times a day (QID) | RESPIRATORY_TRACT | Status: DC | PRN

## 2014-07-07 MED ORDER — PREDNISONE 10 MG PO TABS: 40.0000 mg | ORAL_TABLET | Freq: Every day | ORAL | Status: DC

## 2014-07-07 MED ORDER — ALBUTEROL SULFATE HFA 108 (90 BASE) MCG/ACT IN AERS
2.0000 | INHALATION_SPRAY | Freq: Once | RESPIRATORY_TRACT | Status: AC
  Administered 2014-07-07: 2 via RESPIRATORY_TRACT
  Filled 2014-07-07: qty 6.7

## 2014-07-07 MED ORDER — IPRATROPIUM-ALBUTEROL 0.5-2.5 (3) MG/3ML IN SOLN
RESPIRATORY_TRACT | Status: AC
  Filled 2014-07-07: qty 3

## 2014-07-07 MED ORDER — IPRATROPIUM-ALBUTEROL 0.5-2.5 (3) MG/3ML IN SOLN
3.0000 mL | Freq: Once | RESPIRATORY_TRACT | Status: AC
  Administered 2014-07-07: 3 mL via RESPIRATORY_TRACT

## 2014-07-07 NOTE — ED Provider Notes (Signed)
CSN: [id number]     Arrival date & time 07/07/14  8786 History   First MD Initiated Contact with Patient 07/07/14 0913     Chief Complaint  Patient presents with  . Asthma      HPI Comments: [name] presents for evaluation of cough.  She reports 1 week of cough and shortness of breath. She has been out of her nebulizer medications for the last week. She reports some associated chest tightness. She has had a lot of chest congestion and occasionally coughs up some white sputum. She states symptoms are similar to prior asthma exacerbations.  She denies fevers, abdominal pain, vomiting, leg swelling, or decreased urinary output. She did not take her blood pressure medicines this morning because she had a coughing fit that kept her from taking medication. She reports feeling improved since receiving albuterol nebulizer treatment in triage. Symptoms are moderate, constant, and worsening.  Patient is a 63 y.o. female presenting with asthma. The history is provided by the patient.  Asthma    Past Medical History  Diagnosis Date  . Asthma   . Hypertension   . GERD (gastroesophageal reflux disease)   . A-fib   . Migraine    Past Surgical History  Procedure Laterality Date  . Tubal ligation     Family History  Problem Relation Age of Onset  . Cancer Mother   . Cancer Father   . Heart attack Mother 46   History  Substance Use Topics  . Smoking status: Former Smoker    Types: Cigarettes    Quit date: 11/07/2011  . Smokeless tobacco: Never Used  . Alcohol Use: No   OB History    No data available     Review of Systems  All other systems reviewed and are negative.     Allergies  Pantoprazole and Celecoxib  Home Medications   Prior to Admission medications   Medication Sig Start Date End Date Taking? Authorizing Provider  acetaminophen (TYLENOL) 500 MG tablet Take 1,000 mg by mouth every 6 (six) hours as needed for headache.    Historical Provider, MD  albuterol  (PROVENTIL HFA;VENTOLIN HFA) 108 (90 BASE) MCG/ACT inhaler Inhale 2 puffs into the lungs every 4 (four) hours as needed for wheezing or shortness of breath. 12/06/12   Montine Circle, PA-C  apixaban (ELIQUIS) 5 MG TABS tablet Take 1 tablet (5 mg total) by mouth 2 (two) times daily. 06/07/14   Cherene Altes, MD  benzonatate (TESSALON) 100 MG capsule Take 1 capsule (100 mg total) by mouth 3 (three) times daily as needed for cough. 06/07/14   Cherene Altes, MD  cetirizine (ZYRTEC) 10 MG tablet Take 10 mg by mouth daily as needed for allergies.    Historical Provider, MD  diltiazem (CARDIZEM CD) 120 MG 24 hr capsule Take 1 capsule (120 mg total) by mouth daily. 06/07/14   Cherene Altes, MD  fluticasone (FLOVENT HFA) 110 MCG/ACT inhaler Inhale 1 puff into the lungs 2 (two) times daily. 06/07/14   Cherene Altes, MD  HYDROcodone-acetaminophen (NORCO) 5-325 MG per tablet Take 1-2 tablets by mouth every 6 (six) hours as needed for moderate pain or severe pain. 05/23/14   Orlie Dakin, MD  lisinopril (PRINIVIL,ZESTRIL) 10 MG tablet Take 10 mg by mouth daily.    Historical Provider, MD  metoCLOPramide (REGLAN) 10 MG tablet Take 1 tablet (10 mg total) by mouth every 6 (six) hours as needed for nausea (nausea/headache). 05/23/14   Orlie Dakin, MD  salmeterol (SEREVENT DISKUS) 50 MCG/DOSE diskus inhaler Inhale 1 puff into the lungs 2 (two) times daily. 06/07/14   Cherene Altes, MD  tiotropium (SPIRIVA HANDIHALER) 18 MCG inhalation capsule Place 1 capsule (18 mcg total) into inhaler and inhale daily. 06/22/13   Leota Jacobsen, MD   BP 186/103 mmHg  Pulse 75  Temp(Src) 98.4 F (36.9 C)  Resp 21  SpO2 96% Physical Exam  Constitutional: She is oriented to person, place, and time. She appears well-developed and well-nourished.  HENT:  Head: Normocephalic and atraumatic.  Cardiovascular: Normal rate and regular rhythm.   No murmur heard. Pulmonary/Chest: No respiratory distress.   Occasional end expiratory wheezes, good air movement bilaterally.  Abdominal: Soft. There is no tenderness. There is no rebound and no guarding.  Musculoskeletal: She exhibits no edema or tenderness.  Neurological: She is alert and oriented to person, place, and time.  Skin: Skin is warm and dry.  Psychiatric: She has a normal mood and affect. Her behavior is normal.  Nursing note and vitals reviewed.   ED Course  Procedures (including critical care time) Labs Review Labs Reviewed - No data to display  Imaging Review Dg Chest 2 View  07/07/2014   CLINICAL DATA:  Asthma and shortness of breath  EXAM: CHEST  2 VIEW  COMPARISON:  06/06/2014  FINDINGS: Both lungs are hyperinflated and there is flattening of the hemidiaphragms. Heart size is within normal limits and stable. Mediastinal and hilar contours are normal.  Lungs are clear. Negative for airspace disease, pleural effusion, or pneumothorax.  No acute osseous abnormality.  IMPRESSION: 1.   Hyperinflation of the lungs.  2.    The lungs are clear.   Electronically Signed   By: Curlene Dolphin M.D.   On: 07/07/2014 10:46     EKG Interpretation   Date/Time:  Sunday July 07 2014 09:40:26 EST Ventricular Rate:  76 PR Interval:  108 QRS Duration: 88 QT Interval:  390 QTC Calculation: 438 R Axis:   66 Text Interpretation:  Sinus rhythm Short PR interval Confirmed by Hazle Coca [phone]) on 07/07/2014 9:45:31 AM      MDM   Final diagnoses:  Acute exacerbation of COPD with asthma    Patient here with cough and shortness of breath has a history of COPD and asthma and she is not currently taking her home nebulizer treatments because she is out of the medication. Presentation consistent with acute mild COPD exacerbation. Critical picture not consistent with ACS, PE, CHF, hypertensive urgency, pneumonia. Discussed with patient's home medication use, PCP follow-up, and return precautions.    Quintella Reichert, MD 07/07/14 [phone]

## 2014-07-07 NOTE — ED Notes (Signed)
Pt ambulated to the bathroom and back without any difficulty or distress and stated "I'm feeling better now"

## 2014-07-07 NOTE — Discharge Instructions (Signed)

## 2014-07-07 NOTE — ED Notes (Signed)
Pt comfortable with discharge and follow up instructions. Prescriptions x2. 

## 2014-07-07 NOTE — ED Notes (Signed)
Patient transported to X-ray 

## 2014-07-07 NOTE — ED Notes (Signed)
Pt here for asthma attack this morning. Having difficulty breathing in triage. Chest tightness d/t breathing.

## 2014-07-07 NOTE — ED Notes (Signed)
After breathing treating pt stating 96%RA

## 2014-08-14 ENCOUNTER — Encounter (HOSPITAL_COMMUNITY): Payer: Self-pay | Admitting: *Deleted

## 2014-08-14 ENCOUNTER — Emergency Department (HOSPITAL_COMMUNITY)
Admission: EM | Admit: 2014-08-14 | Discharge: 2014-08-14 | Disposition: A | Discharge status: Home / Self Care | Payer: Medicare Other | Attending: Emergency Medicine | Admitting: Emergency Medicine

## 2014-08-14 DIAGNOSIS — I4891 Unspecified atrial fibrillation: Secondary | ICD-10-CM | POA: Diagnosis not present

## 2014-08-14 DIAGNOSIS — K219 Gastro-esophageal reflux disease without esophagitis: Secondary | ICD-10-CM | POA: Insufficient documentation

## 2014-08-14 DIAGNOSIS — Z87891 Personal history of nicotine dependence: Secondary | ICD-10-CM | POA: Insufficient documentation

## 2014-08-14 DIAGNOSIS — I10 Essential (primary) hypertension: Secondary | ICD-10-CM | POA: Diagnosis not present

## 2014-08-14 DIAGNOSIS — L259 Unspecified contact dermatitis, unspecified cause: Secondary | ICD-10-CM | POA: Diagnosis not present

## 2014-08-14 DIAGNOSIS — J449 Chronic obstructive pulmonary disease, unspecified: Secondary | ICD-10-CM | POA: Diagnosis not present

## 2014-08-14 DIAGNOSIS — G43909 Migraine, unspecified, not intractable, without status migrainosus: Secondary | ICD-10-CM | POA: Insufficient documentation

## 2014-08-14 DIAGNOSIS — Z7952 Long term (current) use of systemic steroids: Secondary | ICD-10-CM | POA: Diagnosis not present

## 2014-08-14 DIAGNOSIS — Z79899 Other long term (current) drug therapy: Secondary | ICD-10-CM | POA: Diagnosis not present

## 2014-08-14 DIAGNOSIS — Z7951 Long term (current) use of inhaled steroids: Secondary | ICD-10-CM | POA: Diagnosis not present

## 2014-08-14 DIAGNOSIS — R21 Rash and other nonspecific skin eruption: Secondary | ICD-10-CM | POA: Diagnosis present

## 2014-08-14 MED ORDER — ALBUTEROL SULFATE HFA 108 (90 BASE) MCG/ACT IN AERS
2.0000 | INHALATION_SPRAY | Freq: Once | RESPIRATORY_TRACT | Status: AC
  Administered 2014-08-14: 2 via RESPIRATORY_TRACT
  Filled 2014-08-14: qty 6.7

## 2014-08-14 MED ORDER — PREDNISONE 20 MG PO TABS
40.0000 mg | ORAL_TABLET | Freq: Once | ORAL | Status: AC
  Administered 2014-08-14: 40 mg via ORAL
  Filled 2014-08-14: qty 2

## 2014-08-14 MED ORDER — LORATADINE 10 MG PO TABS
10.0000 mg | ORAL_TABLET | Freq: Once | ORAL | Status: DC
  Filled 2014-08-14: qty 1

## 2014-08-14 MED ORDER — HYDROCORTISONE 1 % EX CREA: TOPICAL_CREAM | CUTANEOUS | Status: DC

## 2014-08-14 MED ORDER — HYDROCHLOROTHIAZIDE 25 MG PO TABS: 25.0000 mg | ORAL_TABLET | Freq: Every day | ORAL | Status: DC

## 2014-08-14 NOTE — ED Notes (Addendum)
Patient states she had a perm last week.  She has had allergic reaction to perm.  She is having itcing and rash to her scalp.  She states she has not been able to control the itching.  Patient has tried alcohol on her skin.  She tried benadryl x 1 but she felt like it bothered her breathing   Patient complains of burning pain at times.  Patient is alert.  No s/sx of distress.   Patient reports she ran out of albuterol inhaler and her bp medication.  She denies any chest pain.  Denies any sob

## 2014-08-14 NOTE — Discharge Instructions (Signed)
Return to the emergency room with worsening of symptoms, new symptoms or with symptoms that are concerning, especially lip, face, tongue swelling, shortness of breath, difficulty breathing, nausea vomiting, abdominal pain, increase rash on body. Apply hydrocortisone cream to affected areas 2 times daily. Take Claritin, Zyrtec, or Allegra daily to help decrease itching.   Contact Dermatitis Contact dermatitis is a reaction to certain substances that touch the skin. Contact dermatitis can be either irritant contact dermatitis or allergic contact dermatitis. Irritant contact dermatitis does not require previous exposure to the substance for a reaction to occur.Allergic contact dermatitis only occurs if you have been exposed to the substance before. Upon a repeat exposure, your body reacts to the substance.  CAUSES  Many substances can cause contact dermatitis. Irritant dermatitis is most commonly caused by repeated exposure to mildly irritating substances, such as:  Makeup.  Soaps.  Detergents.  Bleaches.  Acids.  Metal salts, such as nickel. Allergic contact dermatitis is most commonly caused by exposure to:  Poisonous plants.  Chemicals (deodorants, shampoos).  Jewelry.  Latex.  Neomycin in triple antibiotic cream.  Preservatives in products, including clothing. SYMPTOMS  The area of skin that is exposed may develop:  Dryness or flaking.  Redness.  Cracks.  Itching.  Pain or a burning sensation.  Blisters. With allergic contact dermatitis, there may also be swelling in areas such as the eyelids, mouth, or genitals.  DIAGNOSIS  Your caregiver can usually tell what the problem is by doing a physical exam. In cases where the cause is uncertain and an allergic contact dermatitis is suspected, a patch skin test may be performed to help determine the cause of your dermatitis. TREATMENT Treatment includes protecting the skin from further contact with the irritating  substance by avoiding that substance if possible. Barrier creams, powders, and gloves may be helpful. Your caregiver may also recommend:  Steroid creams or ointments applied 2 times daily. For best results, soak the rash area in cool water for 20 minutes. Then apply the medicine. Cover the area with a plastic wrap. You can store the steroid cream in the refrigerator for a "chilly" effect on your rash. That may decrease itching. Oral steroid medicines may be needed in more severe cases.  Antibiotics or antibacterial ointments if a skin infection is present.  Antihistamine lotion or an antihistamine taken by mouth to ease itching.  Lubricants to keep moisture in your skin.  Burow's solution to reduce redness and soreness or to dry a weeping rash. Mix one packet or tablet of solution in 2 cups cool water. Dip a clean washcloth in the mixture, wring it out a bit, and put it on the affected area. Leave the cloth in place for 30 minutes. Do this as often as possible throughout the day.  Taking several cornstarch or baking soda baths daily if the area is too large to cover with a washcloth. Harsh chemicals, such as alkalis or acids, can cause skin damage that is like a burn. You should flush your skin for 15 to 20 minutes with cold water after such an exposure. You should also seek immediate medical care after exposure. Bandages (dressings), antibiotics, and pain medicine may be needed for severely irritated skin.  HOME CARE INSTRUCTIONS  Avoid the substance that caused your reaction.  Keep the area of skin that is affected away from hot water, soap, sunlight, chemicals, acidic substances, or anything else that would irritate your skin.  Do not scratch the rash. Scratching may cause the rash  to become infected.  You may take cool baths to help stop the itching.  Only take over-the-counter or prescription medicines as directed by your caregiver.  See your caregiver for follow-up care as directed to  make sure your skin is healing properly. SEEK MEDICAL CARE IF:   Your condition is not better after 3 days of treatment.  You seem to be getting worse.  You see signs of infection such as swelling, tenderness, redness, soreness, or warmth in the affected area.  You have any problems related to your medicines. Document Released: 08/06/2000 Document Revised: 11/01/2011 Document Reviewed: 01/12/2011 [facility] Patient Information 2015 [place], [place]. This information is not intended to replace advice given to you by your health care provider. Make sure you discuss any questions you have with your health care provider.

## 2014-08-14 NOTE — ED Provider Notes (Signed)
CSN: [id number]     Arrival date & time 08/14/14  3151 History   First MD Initiated Contact with Patient 08/14/14 0745     Chief Complaint  Patient presents with  . Allergic Reaction     (Consider location/radiation/quality/duration/timing/severity/associated sxs/prior Treatment) HPI  [name] is a 63 y.o. female with PMH of asthma, COPD, hypertension, GERD, A. fib, migraines presenting with rash on bilateral ears. Patient states she had a perm last week and believes this is a reaction to that. She states the rash is very a chief but has had no discharge, swelling, redness. She denies fevers, chills, nausea, vomiting, abdominal pain. She has tried alcohol on her skin was some improvement. Patient states she took a Benadryl and it bothered her breathing temporarily. No shortness of breath, difficulty breathing, lip, facial swelling. No difficulty opening mouth. No chest pain. Patient also states she is out of her blood pressure medication. She states she plans to follow-up with her primary care provider in the next week. Blood Pressure currently 161/93.  She denies headache, confusion, hematuria. No other complaints.   Past Medical History  Diagnosis Date  . Asthma   . Hypertension   . GERD (gastroesophageal reflux disease)   . A-fib   . Migraine    Past Surgical History  Procedure Laterality Date  . Tubal ligation     Family History  Problem Relation Age of Onset  . Cancer Mother   . Cancer Father   . Heart attack Mother 73   History  Substance Use Topics  . Smoking status: Former Smoker    Types: Cigarettes    Quit date: 11/07/2011  . Smokeless tobacco: Never Used  . Alcohol Use: No   OB History    No data available     Review of Systems  Constitutional: Negative for fever and chills.  HENT: Negative for congestion and rhinorrhea.   Eyes: Negative for discharge and visual disturbance.  Respiratory: Negative for cough and shortness of breath.   Cardiovascular:  Negative for chest pain and palpitations.  Gastrointestinal: Negative for nausea, vomiting and diarrhea.  Skin: Positive for rash.  Neurological: Negative for weakness and headaches.      Allergies  Pantoprazole and Celecoxib  Home Medications   Prior to Admission medications   Medication Sig Start Date End Date Taking? Authorizing Provider  albuterol (PROVENTIL HFA;VENTOLIN HFA) 108 (90 BASE) MCG/ACT inhaler Inhale 2 puffs into the lungs every 4 (four) hours as needed for wheezing or shortness of breath. 12/06/12   Montine Circle, PA-C  albuterol (PROVENTIL) (2.5 MG/3ML) 0.083% nebulizer solution Take 3 mLs (2.5 mg total) by nebulization every 6 (six) hours as needed for wheezing or shortness of breath. 07/07/14   Quintella Reichert, MD  benzonatate (TESSALON) 100 MG capsule Take 1 capsule (100 mg total) by mouth 3 (three) times daily as needed for cough. 06/07/14   Cherene Altes, MD  cetirizine (ZYRTEC) 10 MG tablet Take 10 mg by mouth daily as needed for allergies.    Historical Provider, MD  diltiazem (CARDIZEM CD) 120 MG 24 hr capsule Take 1 capsule (120 mg total) by mouth daily. Patient not taking: Reported on 07/07/2014 06/07/14   Cherene Altes, MD  fluticasone (FLOVENT HFA) 110 MCG/ACT inhaler Inhale 1 puff into the lungs 2 (two) times daily. 06/07/14   Cherene Altes, MD  hydrochlorothiazide (HYDRODIURIL) 25 MG tablet Take 25 mg by mouth daily. 06/11/14   Historical Provider, MD  hydrochlorothiazide (HYDRODIURIL) 25  MG tablet Take 1 tablet (25 mg total) by mouth daily. 08/14/14   Pura Spice, PA-C  HYDROcodone-acetaminophen (NORCO) 5-325 MG per tablet Take 1-2 tablets by mouth every 6 (six) hours as needed for moderate pain or severe pain. 05/23/14   Orlie Dakin, MD  hydrocortisone cream 1 % Apply to affected area 3 times daily 08/14/14   Pura Spice, PA-C  metoCLOPramide (REGLAN) 10 MG tablet Take 1 tablet (10 mg total) by mouth every 6 (six) hours as needed  for nausea (nausea/headache). 05/23/14   Orlie Dakin, MD  predniSONE (DELTASONE) 10 MG tablet Take 4 tablets (40 mg total) by mouth daily. 07/07/14   Quintella Reichert, MD  salmeterol (SEREVENT DISKUS) 50 MCG/DOSE diskus inhaler Inhale 1 puff into the lungs 2 (two) times daily. 06/07/14   Cherene Altes, MD  tiotropium (SPIRIVA HANDIHALER) 18 MCG inhalation capsule Place 1 capsule (18 mcg total) into inhaler and inhale daily. 06/22/13   Leota Jacobsen, MD   BP 161/93 mmHg  Pulse 74  Temp(Src) 98.5 F (36.9 C) (Oral)  Resp 22  Ht 5\' 7"  (1.702 m)  Wt 129 lb (58.514 kg)  BMI 20.20 kg/m2  SpO2 99% Physical Exam  Constitutional: She appears well-developed and well-nourished. No distress.  HENT:  Head: Normocephalic and atraumatic.  No lip, tongue, facial swelling. No tenderness under tongue. No oral lesions.  Eyes: Conjunctivae and EOM are normal. Right eye exhibits no discharge. Left eye exhibits no discharge.  Cardiovascular: Normal rate, regular rhythm and normal heart sounds.   Pulmonary/Chest: Effort normal and breath sounds normal. No respiratory distress. She has no wheezes.  Abdominal: Soft. Bowel sounds are normal. She exhibits no distension. There is no tenderness.  Neurological: She is alert. She exhibits normal muscle tone. Coordination normal.  Skin: Skin is warm and dry. She is not diaphoretic.  2 mm papules that are mildly erythematous to bilateral external ear and behind ears. They're blanching without discharge. No surrounding signs of cellulitis or infection. No rash to hairline or scalp.  Nursing note and vitals reviewed.   ED Course  Procedures (including critical care time) Labs Review Labs Reviewed - No data to display  Imaging Review No results found.   EKG Interpretation None      MDM   Final diagnoses:  Rash   Patient with possible allergic reaction to perm which presents as erythematous, pruritic rash to bilateral ears, he contact dermatitis. No  evidence of cellulitis or infection. Not appear to be insect or bug bites. Patient stated she tried Benadryl but it bothers her breathing. Would not recommend Benadryl. the patient is to start either Claritin, Zyrtec, Allegra for the itching. She's been given a prednisone in the ED with improvement of her symptoms. Patient also to use hydrocortisone cream to the affected area. Patient without any systemic symptoms. Lungs clear to auscultation bilaterally respiratory rate 22 and oxygen stat 99%. No lip, tongue, facial swelling. Patient also found to be hypertensive in ED it is not been taking her blood pressure medicines because she ran out. Will short prescription of BP meds. No signs or symptoms of hypertensive urgency. Patient states she will follow with her primary care provider in one week.  Discussed return precautions with patient. Discussed all results and patient verbalizes understanding and agrees with plan.     Pura Spice, PA-C 08/14/14 [place], DO 08/14/14 [phone]

## 2014-10-28 ENCOUNTER — Emergency Department (HOSPITAL_COMMUNITY)
Admission: EM | Admit: 2014-10-28 | Discharge: 2014-10-28 | Disposition: A | Discharge status: Home / Self Care | Payer: Medicare Other | Attending: Emergency Medicine | Admitting: Emergency Medicine

## 2014-10-28 ENCOUNTER — Encounter (HOSPITAL_COMMUNITY): Payer: Self-pay | Admitting: Physical Medicine and Rehabilitation

## 2014-10-28 ENCOUNTER — Emergency Department (HOSPITAL_COMMUNITY): Payer: Medicare Other

## 2014-10-28 DIAGNOSIS — Z7951 Long term (current) use of inhaled steroids: Secondary | ICD-10-CM | POA: Insufficient documentation

## 2014-10-28 DIAGNOSIS — Z79899 Other long term (current) drug therapy: Secondary | ICD-10-CM | POA: Diagnosis not present

## 2014-10-28 DIAGNOSIS — I10 Essential (primary) hypertension: Secondary | ICD-10-CM | POA: Diagnosis not present

## 2014-10-28 DIAGNOSIS — Z8669 Personal history of other diseases of the nervous system and sense organs: Secondary | ICD-10-CM | POA: Insufficient documentation

## 2014-10-28 DIAGNOSIS — Y9289 Other specified places as the place of occurrence of the external cause: Secondary | ICD-10-CM | POA: Diagnosis not present

## 2014-10-28 DIAGNOSIS — Z7952 Long term (current) use of systemic steroids: Secondary | ICD-10-CM | POA: Insufficient documentation

## 2014-10-28 DIAGNOSIS — N939 Abnormal uterine and vaginal bleeding, unspecified: Secondary | ICD-10-CM | POA: Diagnosis not present

## 2014-10-28 DIAGNOSIS — S00561A Insect bite (nonvenomous) of lip, initial encounter: Secondary | ICD-10-CM | POA: Insufficient documentation

## 2014-10-28 DIAGNOSIS — J45909 Unspecified asthma, uncomplicated: Secondary | ICD-10-CM | POA: Diagnosis not present

## 2014-10-28 DIAGNOSIS — L299 Pruritus, unspecified: Secondary | ICD-10-CM | POA: Insufficient documentation

## 2014-10-28 DIAGNOSIS — Z8719 Personal history of other diseases of the digestive system: Secondary | ICD-10-CM | POA: Insufficient documentation

## 2014-10-28 DIAGNOSIS — Y998 Other external cause status: Secondary | ICD-10-CM | POA: Diagnosis not present

## 2014-10-28 DIAGNOSIS — Y9389 Activity, other specified: Secondary | ICD-10-CM | POA: Insufficient documentation

## 2014-10-28 DIAGNOSIS — W57XXXA Bitten or stung by nonvenomous insect and other nonvenomous arthropods, initial encounter: Secondary | ICD-10-CM | POA: Diagnosis not present

## 2014-10-28 DIAGNOSIS — Z87891 Personal history of nicotine dependence: Secondary | ICD-10-CM | POA: Diagnosis not present

## 2014-10-28 DIAGNOSIS — L03211 Cellulitis of face: Secondary | ICD-10-CM | POA: Diagnosis not present

## 2014-10-28 DIAGNOSIS — N858 Other specified noninflammatory disorders of uterus: Secondary | ICD-10-CM | POA: Diagnosis not present

## 2014-10-28 DIAGNOSIS — L0201 Cutaneous abscess of face: Secondary | ICD-10-CM | POA: Diagnosis not present

## 2014-10-28 DIAGNOSIS — N950 Postmenopausal bleeding: Secondary | ICD-10-CM | POA: Diagnosis not present

## 2014-10-28 LAB — CBC WITH DIFFERENTIAL/PLATELET
BASOS PCT: 0 % (ref 0–1)
Basophils Absolute: 0 10*3/uL (ref 0.0–0.1)
Eosinophils Absolute: 0.2 10*3/uL (ref 0.0–0.7)
Eosinophils Relative: 5 % (ref 0–5)
HCT: 39.3 % (ref 36.0–46.0)
Hemoglobin: 13.1 g/dL (ref 12.0–15.0)
LYMPHS PCT: 41 % (ref 12–46)
Lymphs Abs: 2.1 10*3/uL (ref 0.7–4.0)
MCH: 26.8 pg (ref 26.0–34.0)
MCHC: 33.3 g/dL (ref 30.0–36.0)
MCV: 80.5 fL (ref 78.0–100.0)
Monocytes Absolute: 0.4 10*3/uL (ref 0.1–1.0)
Monocytes Relative: 8 % (ref 3–12)
NEUTROS ABS: 2.3 10*3/uL (ref 1.7–7.7)
Neutrophils Relative %: 46 % (ref 43–77)
Platelets: 184 10*3/uL (ref 150–400)
RBC: 4.88 MIL/uL (ref 3.87–5.11)
RDW: 15.2 % (ref 11.5–15.5)
WBC: 5.1 10*3/uL (ref 4.0–10.5)

## 2014-10-28 LAB — URINE MICROSCOPIC-ADD ON

## 2014-10-28 LAB — COMPREHENSIVE METABOLIC PANEL
ALBUMIN: 4.1 g/dL (ref 3.5–5.2)
ALK PHOS: 66 U/L (ref 39–117)
ALT: 19 U/L (ref 0–35)
ANION GAP: 5 (ref 5–15)
AST: 22 U/L (ref 0–37)
BUN: 10 mg/dL (ref 6–23)
CO2: 29 mmol/L (ref 19–32)
CREATININE: 0.7 mg/dL (ref 0.50–1.10)
Calcium: 9.3 mg/dL (ref 8.4–10.5)
Chloride: 106 mmol/L (ref 96–112)
GFR calc Af Amer: >90 mL/min (ref >90)
GFR calc non Af Amer: 89 mL/min — ABNORMAL LOW (ref >90)
Glucose, Bld: 88 mg/dL (ref 70–99)
POTASSIUM: 3 mmol/L — AB (ref 3.5–5.1)
Sodium: 140 mmol/L (ref 135–145)
Total Bilirubin: 0.7 mg/dL (ref 0.3–1.2)
Total Protein: 7.8 g/dL (ref 6.0–8.3)

## 2014-10-28 LAB — URINALYSIS, ROUTINE W REFLEX MICROSCOPIC
Bilirubin Urine: NEGATIVE
GLUCOSE, UA: NEGATIVE mg/dL
Ketones, ur: NEGATIVE mg/dL
Leukocytes, UA: NEGATIVE
Nitrite: NEGATIVE
Protein, ur: NEGATIVE mg/dL
SPECIFIC GRAVITY, URINE: 1.018 (ref 1.005–1.030)
Urobilinogen, UA: 0.2 mg/dL (ref 0.0–1.0)
pH: 7.5 (ref 5.0–8.0)

## 2014-10-28 MED ORDER — SULFAMETHOXAZOLE-TRIMETHOPRIM 800-160 MG PO TABS
1.0000 | ORAL_TABLET | Freq: Once | ORAL | Status: AC
  Administered 2014-10-28: 1 via ORAL
  Filled 2014-10-28: qty 1

## 2014-10-28 MED ORDER — SULFAMETHOXAZOLE-TRIMETHOPRIM 800-160 MG PO TABS: 1.0000 | ORAL_TABLET | Freq: Two times a day (BID) | ORAL | Status: DC

## 2014-10-28 MED ORDER — SODIUM CHLORIDE 0.9 % IV BOLUS (SEPSIS)
1000.0000 mL | Freq: Once | INTRAVENOUS | Status: AC
  Administered 2014-10-28: 1000 mL via INTRAVENOUS

## 2014-10-28 MED ORDER — DIPHENHYDRAMINE HCL 50 MG/ML IJ SOLN
25.0000 mg | Freq: Once | INTRAMUSCULAR | Status: AC
  Administered 2014-10-28: 25 mg via INTRAVENOUS
  Filled 2014-10-28: qty 1

## 2014-10-28 NOTE — ED Notes (Signed)
Pt presents to department for evaluation of rash, upper and lower lip swelling. Also states vaginal bleeding x15 minutes on Saturday. Respirations unlabored. No signs of acute distress noted.

## 2014-10-28 NOTE — ED Notes (Signed)
Pt states she was bit by something on the left side of her face, moderate swelling to lips, airway intact.   States she has a rash ongoing for a month to R elbow, states she was seen here about a month ago for it but no records in [facility]. States she had a similar rash to her scalp the last time she had a perm. States she thinks that when she scratches it and touches another part of her skin, the rash moves to that location.  States she also had an episode of vaginal bleeding that lasted approx 10 minutes, states she also experienced abdominal cramps similar to menstrual cramps at that time. States she hasn't had a period in 40 years. Denies discharge, states she bleed enough to fill a pad. No bleeding since.   Denies pain at this time.

## 2014-10-28 NOTE — ED Provider Notes (Signed)
CSN: [id number]     Arrival date & time 10/28/14  6606 History   First MD Initiated Contact with Patient 10/28/14 [phone]     Chief Complaint  Patient presents with  . Oral Swelling  . Rash  . Vaginal Bleeding     (Consider location/radiation/quality/duration/timing/severity/associated sxs/prior Treatment) The history is provided by the patient.  [name] is a 64 y.o. female history of asthma, hypertension, paroxysmal A. fib with no anticoagulation, here presenting with rash, left lip swelling, vaginal bleeding. She uses a new perm several months ago and has been having intermittent rash since then. She was seen in the ER and was given hydrocortisone cream which didn't help. She states the wretches itchy. Denies any trouble breathing or shortness of breath. She also woke up this morning and noticed that she may have been bitten on the left upper lip and has some swelling around that. Denies any throat closing. Of note 2 days ago patient had an episode of vaginal bleeding for about 10 minutes. She is currently postmenopausal and hasn't had a period for the last 40 years. Denies any hysterectomy or history of ovarian or uterine cancers.    Past Medical History  Diagnosis Date  . Asthma   . Hypertension   . GERD (gastroesophageal reflux disease)   . A-fib   . Migraine    Past Surgical History  Procedure Laterality Date  . Tubal ligation     Family History  Problem Relation Age of Onset  . Cancer Mother   . Cancer Father   . Heart attack Mother 44   History  Substance Use Topics  . Smoking status: Former Smoker    Types: Cigarettes    Quit date: 11/07/2011  . Smokeless tobacco: Never Used  . Alcohol Use: No   OB History    No data available     Review of Systems  Genitourinary: Positive for vaginal bleeding.  Skin: Positive for rash.  All other systems reviewed and are negative.     Allergies  Pantoprazole and Celecoxib  Home Medications   Prior to Admission  medications   Medication Sig Start Date End Date Taking? Authorizing Provider  albuterol (PROVENTIL HFA;VENTOLIN HFA) 108 (90 BASE) MCG/ACT inhaler Inhale 2 puffs into the lungs every 4 (four) hours as needed for wheezing or shortness of breath. 12/06/12   Montine Circle, PA-C  albuterol (PROVENTIL) (2.5 MG/3ML) 0.083% nebulizer solution Take 3 mLs (2.5 mg total) by nebulization every 6 (six) hours as needed for wheezing or shortness of breath. 07/07/14   Quintella Reichert, MD  benzonatate (TESSALON) 100 MG capsule Take 1 capsule (100 mg total) by mouth 3 (three) times daily as needed for cough. 06/07/14   Cherene Altes, MD  cetirizine (ZYRTEC) 10 MG tablet Take 10 mg by mouth daily as needed for allergies.    Historical Provider, MD  diltiazem (CARDIZEM CD) 120 MG 24 hr capsule Take 1 capsule (120 mg total) by mouth daily. Patient not taking: Reported on 07/07/2014 06/07/14   Cherene Altes, MD  fluticasone (FLOVENT HFA) 110 MCG/ACT inhaler Inhale 1 puff into the lungs 2 (two) times daily. 06/07/14   Cherene Altes, MD  hydrochlorothiazide (HYDRODIURIL) 25 MG tablet Take 25 mg by mouth daily. 06/11/14   Historical Provider, MD  hydrochlorothiazide (HYDRODIURIL) 25 MG tablet Take 1 tablet (25 mg total) by mouth daily. 08/14/14   Pura Spice, PA-C  HYDROcodone-acetaminophen (NORCO) 5-325 MG per tablet Take 1-2 tablets by mouth  every 6 (six) hours as needed for moderate pain or severe pain. 05/23/14   Orlie Dakin, MD  hydrocortisone cream 1 % Apply to affected area 3 times daily 08/14/14   Pura Spice, PA-C  metoCLOPramide (REGLAN) 10 MG tablet Take 1 tablet (10 mg total) by mouth every 6 (six) hours as needed for nausea (nausea/headache). 05/23/14   Orlie Dakin, MD  predniSONE (DELTASONE) 10 MG tablet Take 4 tablets (40 mg total) by mouth daily. 07/07/14   Quintella Reichert, MD  salmeterol (SEREVENT DISKUS) 50 MCG/DOSE diskus inhaler Inhale 1 puff into the lungs 2 (two) times daily.  06/07/14   Cherene Altes, MD  tiotropium (SPIRIVA HANDIHALER) 18 MCG inhalation capsule Place 1 capsule (18 mcg total) into inhaler and inhale daily. 06/22/13   Leota Jacobsen, MD   BP 147/76 mmHg  Pulse 65  Temp(Src) 98.7 F (37.1 C) (Oral)  Resp 20  SpO2 96% Physical Exam  Constitutional: She is oriented to person, place, and time. She appears well-developed.  HENT:  Head: Normocephalic.  Mouth/Throat: Oropharynx is clear and moist.  L upper lip with bug bite with mild L upper lip swelling. Dentures in place, no obvious PTA and OP clear   Eyes: Conjunctivae are normal. Pupils are equal, round, and reactive to light.  Neck: Normal range of motion. Neck supple.  Cardiovascular: Normal rate, regular rhythm and normal heart sounds.   Pulmonary/Chest: Effort normal and breath sounds normal. No respiratory distress. She has no wheezes. She has no rales.  Abdominal: Soft. Bowel sounds are normal. She exhibits no distension. There is no tenderness. There is no rebound.  Musculoskeletal: Normal range of motion. She exhibits no edema or tenderness.  Neurological: She is alert and oriented to person, place, and time.  Skin:  puritis on bilateral arms, not involving torso or back. No cellulitis, no rash in web spaces   Psychiatric: She has a normal mood and affect. Her behavior is normal. Judgment and thought content normal.  Nursing note and vitals reviewed.   ED Course  Procedures (including critical care time) Labs Review Labs Reviewed  COMPREHENSIVE METABOLIC PANEL - Abnormal; Notable for the following:    Potassium 3.0 (*)    GFR calc non Af Amer 89 (*)    All other components within normal limits  URINALYSIS, ROUTINE W REFLEX MICROSCOPIC - Abnormal; Notable for the following:    APPearance CLOUDY (*)    Hgb urine dipstick TRACE (*)    All other components within normal limits  URINE MICROSCOPIC-ADD ON - Abnormal; Notable for the following:    Squamous Epithelial / LPF FEW  (*)    All other components within normal limits  CBC WITH DIFFERENTIAL/PLATELET    Imaging Review US Transvaginal Non-ob  10/28/2014   CLINICAL DATA:  Postmenopausal bleeding.  EXAM: TRANSABDOMINAL AND TRANSVAGINAL ULTRASOUND OF PELVIS  TECHNIQUE: Both transabdominal and transvaginal ultrasound examinations of the pelvis were performed. Transabdominal technique was performed for global imaging of the pelvis including uterus, ovaries, adnexal regions, and pelvic cul-de-sac. It was necessary to proceed with endovaginal exam following the transabdominal exam to visualize the 01/23/2013.  COMPARISON:  01/23/2013  FINDINGS: Uterus  Measurements: 7.0 by 2.8 by 4.2 cm. In the left anterior uterine fundus, a 1.4 by 1.1 by 1.1 cm mass is compatible with fibroid. In the left anterior uterine body, a 1.1 by 0.9 by 1.0 cm similar mass representing intramural fibroid is present.  Endometrium  Thickness: Much of the endometrial thickness is  about 2 mm. Questionable focal endometrial thickening 2 6 mm near the fundal extent of the endometrium on image 15.  Right ovary  Measurements: 1.9 by 1.0 by 1.0 cm. Normal appearance/no adnexal mass.  Left ovary  Measurements: 1.7 by 1.0 by 1.2 cm. Normal appearance/no adnexal mass.  Other findings  Small amount of free pelvic fluid.  IMPRESSION: 1. Most of the endometrium is not thickened at about 2 mm, but there is questionable focus of thicker endometrium near the fundal margin of the uterus measuring up to 6 mm on a single image. I am uncertain whether this is a true finding, but given the post menopausal vaginal bleeding, this may warrant further workup by sonohysterography or endometrial biopsy. 2. Small amount of free pelvic fluid. 3. Two small intramural masses in the left-sided uterus, favoring fibroids.   Electronically Signed   By: Van Clines M.D.   On: 10/28/2014 09:01   US Pelvis Complete  10/28/2014   CLINICAL DATA:  Postmenopausal bleeding.  EXAM:  TRANSABDOMINAL AND TRANSVAGINAL ULTRASOUND OF PELVIS  TECHNIQUE: Both transabdominal and transvaginal ultrasound examinations of the pelvis were performed. Transabdominal technique was performed for global imaging of the pelvis including uterus, ovaries, adnexal regions, and pelvic cul-de-sac. It was necessary to proceed with endovaginal exam following the transabdominal exam to visualize the 01/23/2013.  COMPARISON:  01/23/2013  FINDINGS: Uterus  Measurements: 7.0 by 2.8 by 4.2 cm. In the left anterior uterine fundus, a 1.4 by 1.1 by 1.1 cm mass is compatible with fibroid. In the left anterior uterine body, a 1.1 by 0.9 by 1.0 cm similar mass representing intramural fibroid is present.  Endometrium  Thickness: Much of the endometrial thickness is about 2 mm. Questionable focal endometrial thickening 2 6 mm near the fundal extent of the endometrium on image 15.  Right ovary  Measurements: 1.9 by 1.0 by 1.0 cm. Normal appearance/no adnexal mass.  Left ovary  Measurements: 1.7 by 1.0 by 1.2 cm. Normal appearance/no adnexal mass.  Other findings  Small amount of free pelvic fluid.  IMPRESSION: 1. Most of the endometrium is not thickened at about 2 mm, but there is questionable focus of thicker endometrium near the fundal margin of the uterus measuring up to 6 mm on a single image. I am uncertain whether this is a true finding, but given the post menopausal vaginal bleeding, this may warrant further workup by sonohysterography or endometrial biopsy. 2. Small amount of free pelvic fluid. 3. Two small intramural masses in the left-sided uterus, favoring fibroids.   Electronically Signed   By: Van Clines M.D.   On: 10/28/2014 09:01     EKG Interpretation None      MDM   Final diagnoses:  Vaginal bleeding    [name] is a 64 y.o. female here with rash, L lip swelling, vag bleeding. She has underlying eczema. Rash on arms have been there for several months and not getting worse. However, L lip  rash and swelling new and likely from inflammatory changes/ early infection from a bug bite. I think a course of bactrim may help. She had an episode of vag bleed and is post menopausal. Will get [name] to r/o cancer.   9:49 AM CBC nl. US showed fibroids and thickening of endometrium. Will dc with course of bactrim. Will have her f/u with GYN to get endometrial biopsy.     Wandra Arthurs, MD 10/28/14 [phone]

## 2014-10-28 NOTE — ED Notes (Signed)
MD at bedside. 

## 2014-10-28 NOTE — Discharge Instructions (Signed)
Take bactrim twice daily for a week.   Take benadryl for itchiness.   You have fibroids and an abnormal ultrasound. You will need to see GYN to get endometrial biopsy to make sure that it is not cancer.   Return to ER if you have more bleeding, worse rash, fevers.

## 2014-10-28 NOTE — ED Notes (Signed)
When entering room to assess pt, pt states she needs to make a phone call, stating its an emergency.

## 2015-02-19 DIAGNOSIS — K219 Gastro-esophageal reflux disease without esophagitis: Secondary | ICD-10-CM | POA: Diagnosis not present

## 2015-02-19 DIAGNOSIS — I10 Essential (primary) hypertension: Secondary | ICD-10-CM | POA: Diagnosis not present

## 2015-02-19 DIAGNOSIS — R1013 Epigastric pain: Secondary | ICD-10-CM | POA: Diagnosis not present

## 2015-02-19 DIAGNOSIS — Z0000 Encounter for general adult medical examination without abnormal findings: Secondary | ICD-10-CM | POA: Diagnosis not present

## 2015-02-19 DIAGNOSIS — Z720 Tobacco use: Secondary | ICD-10-CM | POA: Diagnosis not present

## 2015-03-20 DIAGNOSIS — K219 Gastro-esophageal reflux disease without esophagitis: Secondary | ICD-10-CM | POA: Diagnosis not present

## 2015-03-20 DIAGNOSIS — E559 Vitamin D deficiency, unspecified: Secondary | ICD-10-CM | POA: Diagnosis not present

## 2015-03-20 DIAGNOSIS — Z720 Tobacco use: Secondary | ICD-10-CM | POA: Diagnosis not present

## 2015-03-20 DIAGNOSIS — F419 Anxiety disorder, unspecified: Secondary | ICD-10-CM | POA: Diagnosis not present

## 2015-05-13 ENCOUNTER — Emergency Department (HOSPITAL_COMMUNITY)
Admission: EM | Admit: 2015-05-13 | Discharge: 2015-05-13 | Disposition: A | Discharge status: Home / Self Care | Payer: Medicare Other | Attending: Emergency Medicine | Admitting: Emergency Medicine

## 2015-05-13 ENCOUNTER — Encounter (HOSPITAL_COMMUNITY): Payer: Self-pay | Admitting: *Deleted

## 2015-05-13 DIAGNOSIS — K219 Gastro-esophageal reflux disease without esophagitis: Secondary | ICD-10-CM | POA: Insufficient documentation

## 2015-05-13 DIAGNOSIS — Z79899 Other long term (current) drug therapy: Secondary | ICD-10-CM | POA: Insufficient documentation

## 2015-05-13 DIAGNOSIS — L259 Unspecified contact dermatitis, unspecified cause: Secondary | ICD-10-CM | POA: Diagnosis not present

## 2015-05-13 DIAGNOSIS — Z7951 Long term (current) use of inhaled steroids: Secondary | ICD-10-CM | POA: Insufficient documentation

## 2015-05-13 DIAGNOSIS — Z7952 Long term (current) use of systemic steroids: Secondary | ICD-10-CM | POA: Diagnosis not present

## 2015-05-13 DIAGNOSIS — I10 Essential (primary) hypertension: Secondary | ICD-10-CM | POA: Diagnosis not present

## 2015-05-13 DIAGNOSIS — Z87891 Personal history of nicotine dependence: Secondary | ICD-10-CM | POA: Diagnosis not present

## 2015-05-13 DIAGNOSIS — J45909 Unspecified asthma, uncomplicated: Secondary | ICD-10-CM | POA: Insufficient documentation

## 2015-05-13 DIAGNOSIS — R21 Rash and other nonspecific skin eruption: Secondary | ICD-10-CM | POA: Diagnosis present

## 2015-05-13 MED ORDER — TRIAMCINOLONE ACETONIDE 0.025 % EX CREA: 1.0000 "application " | TOPICAL_CREAM | Freq: Two times a day (BID) | CUTANEOUS | Status: DC

## 2015-05-13 MED ORDER — LORATADINE 10 MG PO TABS
10.0000 mg | ORAL_TABLET | Freq: Once | ORAL | Status: AC
  Administered 2015-05-13: 10 mg via ORAL
  Filled 2015-05-13: qty 1

## 2015-05-13 MED ORDER — LORATADINE 10 MG PO TABS
10.0000 mg | ORAL_TABLET | Freq: Every day | ORAL | Status: DC
Start: 2015-05-13 — End: 2015-11-26

## 2015-05-13 NOTE — ED Notes (Signed)
Declined W/C at D/C and was escorted to lobby by RN. 

## 2015-05-13 NOTE — ED Notes (Addendum)
PT reports a rash in scalp with itching Pt used a relaxer on hair and now has scalp irritation. Pt also reports rash to bacl and ABD. Pt reports taking OTCs with out relief.

## 2015-05-13 NOTE — ED Provider Notes (Signed)
CSN: [id number]     Arrival date & time 05/13/15  0728 History   First MD Initiated Contact with Patient 05/13/15 [phone]     Chief Complaint  Patient presents with  . Allergic Reaction    HPI   64 year old female presents today with pruritus. Patient reports approximately one week ago she had her hair dyed after that she noticed scalp irritation, and itching of her upper torso. She reports she continued to itch throughout the week, has tried Benadryl without relief. Patient has also tried topical over-the-counter hydrocortisone cream that seems improved since symptoms slightly. Patient denies any changes in lotions, soaps, detergents, no one in the house experiencing similar symptoms. She denies any insect bites, previous history of the same. She denies any difficulty breathing, swallowing, fever, chills, infection, chest pain, abdominal pain, nausea vomiting or diarrhea.  Past Medical History  Diagnosis Date  . Asthma   . Hypertension   . GERD (gastroesophageal reflux disease)   . A-fib   . Migraine    Past Surgical History  Procedure Laterality Date  . Tubal ligation     Family History  Problem Relation Age of Onset  . Cancer Mother   . Cancer Father   . Heart attack Mother 25   Social History  Substance Use Topics  . Smoking status: Former Smoker    Types: Cigarettes    Quit date: 11/07/2011  . Smokeless tobacco: Never Used  . Alcohol Use: No   OB History    No data available     Review of Systems  All other systems reviewed and are negative.   Allergies  Pantoprazole and Celecoxib  Home Medications   Prior to Admission medications   Medication Sig Start Date End Date Taking? Authorizing Provider  albuterol (PROVENTIL HFA;VENTOLIN HFA) 108 (90 BASE) MCG/ACT inhaler Inhale 2 puffs into the lungs every 4 (four) hours as needed for wheezing or shortness of breath. 12/06/12   Montine Circle, PA-C  albuterol (PROVENTIL) (2.5 MG/3ML) 0.083% nebulizer solution Take 3  mLs (2.5 mg total) by nebulization every 6 (six) hours as needed for wheezing or shortness of breath. 07/07/14   Quintella Reichert, MD  benzonatate (TESSALON) 100 MG capsule Take 1 capsule (100 mg total) by mouth 3 (three) times daily as needed for cough. 06/07/14   Cherene Altes, MD  cetirizine (ZYRTEC) 10 MG tablet Take 10 mg by mouth daily as needed for allergies.    Historical Provider, MD  diltiazem (CARDIZEM CD) 120 MG 24 hr capsule Take 1 capsule (120 mg total) by mouth daily. Patient not taking: Reported on 07/07/2014 06/07/14   Cherene Altes, MD  fluticasone (FLOVENT HFA) 110 MCG/ACT inhaler Inhale 1 puff into the lungs 2 (two) times daily. 06/07/14   Cherene Altes, MD  hydrochlorothiazide (HYDRODIURIL) 25 MG tablet Take 25 mg by mouth daily. 06/11/14   Historical Provider, MD  hydrochlorothiazide (HYDRODIURIL) 25 MG tablet Take 1 tablet (25 mg total) by mouth daily. 08/14/14   Al Corpus, PA-C  HYDROcodone-acetaminophen (NORCO) 5-325 MG per tablet Take 1-2 tablets by mouth every 6 (six) hours as needed for moderate pain or severe pain. 05/23/14   Orlie Dakin, MD  hydrocortisone cream 1 % Apply to affected area 3 times daily 08/14/14   [place], PA-C  loratadine (CLARITIN) 10 MG tablet Take 1 tablet (10 mg total) by mouth daily. 05/13/15   Okey Regal, PA-C  metoCLOPramide (REGLAN) 10 MG tablet Take 1 tablet (10 mg total) by mouth  every 6 (six) hours as needed for nausea (nausea/headache). 05/23/14   Orlie Dakin, MD  predniSONE (DELTASONE) 10 MG tablet Take 4 tablets (40 mg total) by mouth daily. 07/07/14   Quintella Reichert, MD  salmeterol (SEREVENT DISKUS) 50 MCG/DOSE diskus inhaler Inhale 1 puff into the lungs 2 (two) times daily. 06/07/14   Cherene Altes, MD  sulfamethoxazole-trimethoprim (SEPTRA DS) 800-160 MG per tablet Take 1 tablet by mouth every 12 (twelve) hours. 10/28/14   Wandra Arthurs, MD  tiotropium (SPIRIVA HANDIHALER) 18 MCG inhalation capsule Place 1  capsule (18 mcg total) into inhaler and inhale daily. 06/22/13   Lacretia Leigh, MD  triamcinolone (KENALOG) 0.025 % cream Apply 1 application topically 2 (two) times daily. 05/13/15   Jeffrey Hedges, PA-C   BP 146/64 mmHg  Pulse 57  Temp(Src) 97.6 F (36.4 C) (Oral)  Resp 16  Ht 5\' 7"  (1.702 m)  Wt 118 lb (53.524 kg)  BMI 18.48 kg/m2  SpO2 99% Physical Exam  Constitutional: She is oriented to person, place, and time. She appears well-developed and well-nourished.  HENT:  Head: Normocephalic and atraumatic.  Mouth/Throat: Uvula is midline, oropharynx is clear and moist and mucous membranes are normal. No oropharyngeal exudate, posterior oropharyngeal edema, posterior oropharyngeal erythema or tonsillar abscesses.  Eyes: Conjunctivae are normal. Pupils are equal, round, and reactive to light. Right eye exhibits no discharge. Left eye exhibits no discharge. No scleral icterus.  Neck: Normal range of motion. No JVD present. No tracheal deviation present.  Pulmonary/Chest: Effort normal. No stridor.  Neurological: She is alert and oriented to person, place, and time. Coordination normal.  Skin:  Minimal isolated papules to the upper extremities torso and back, no signs of surrounding cellulitis or infection. Small amounts of excoriation noted. Scalp irritation noted, copious Vaseline covering the area of irritation, no signs of cellulitis  Psychiatric: She has a normal mood and affect. Her behavior is normal. Judgment and thought content normal.  Nursing note and vitals reviewed.   ED Course  Procedures (including critical care time) Labs Review Labs Reviewed - No data to display  Imaging Review No results found. I have personally reviewed and evaluated these images and lab results as part of my medical decision-making.   EKG Interpretation None      MDM   Final diagnoses:  Contact dermatitis    Labs:  Imaging:  Consults:  Therapeutics: Claritin  Discharge Meds:  Claritin, triamcinolone  Assessment/Plan: Patient's presentation most like represents contact dermatitis; no signs of infection noted. Patient will be instructed to use Claritin daily, triamcinolone cream 2 times daily, follow up with primary care provider in 2-3 days for reevaluation. Patient given strict return cautioned, verbalizes understanding and agreement for today's plan and had no further questions or concerns at the time discharge.         Okey Regal, PA-C 05/13/15 0800  Varney Biles, MD 05/13/15 [phone]

## 2015-05-13 NOTE — Discharge Instructions (Signed)
Please use medications as directed, please contact your primary care provider follow-up for reevaluation.

## 2015-05-18 ENCOUNTER — Encounter (HOSPITAL_COMMUNITY): Payer: Self-pay | Admitting: Emergency Medicine

## 2015-05-18 ENCOUNTER — Emergency Department (HOSPITAL_COMMUNITY)
Admission: EM | Admit: 2015-05-18 | Discharge: 2015-05-18 | Disposition: A | Discharge status: Home / Self Care | Payer: Medicare Other | Attending: Emergency Medicine | Admitting: Emergency Medicine

## 2015-05-18 DIAGNOSIS — Z87891 Personal history of nicotine dependence: Secondary | ICD-10-CM | POA: Insufficient documentation

## 2015-05-18 DIAGNOSIS — G43909 Migraine, unspecified, not intractable, without status migrainosus: Secondary | ICD-10-CM | POA: Diagnosis not present

## 2015-05-18 DIAGNOSIS — Z7951 Long term (current) use of inhaled steroids: Secondary | ICD-10-CM | POA: Diagnosis not present

## 2015-05-18 DIAGNOSIS — I10 Essential (primary) hypertension: Secondary | ICD-10-CM | POA: Insufficient documentation

## 2015-05-18 DIAGNOSIS — Z79899 Other long term (current) drug therapy: Secondary | ICD-10-CM | POA: Insufficient documentation

## 2015-05-18 DIAGNOSIS — K219 Gastro-esophageal reflux disease without esophagitis: Secondary | ICD-10-CM | POA: Diagnosis not present

## 2015-05-18 DIAGNOSIS — R21 Rash and other nonspecific skin eruption: Secondary | ICD-10-CM

## 2015-05-18 DIAGNOSIS — J45909 Unspecified asthma, uncomplicated: Secondary | ICD-10-CM | POA: Insufficient documentation

## 2015-05-18 IMAGING — CR DG CHEST 2V
2 series · 2 of 2 positions shown · non-contrast
Comparison: February 15, 2013

CLINICAL DATA: Shortness of breath and chest pain

EXAM:
CHEST  2 VIEW

[w chest pa]
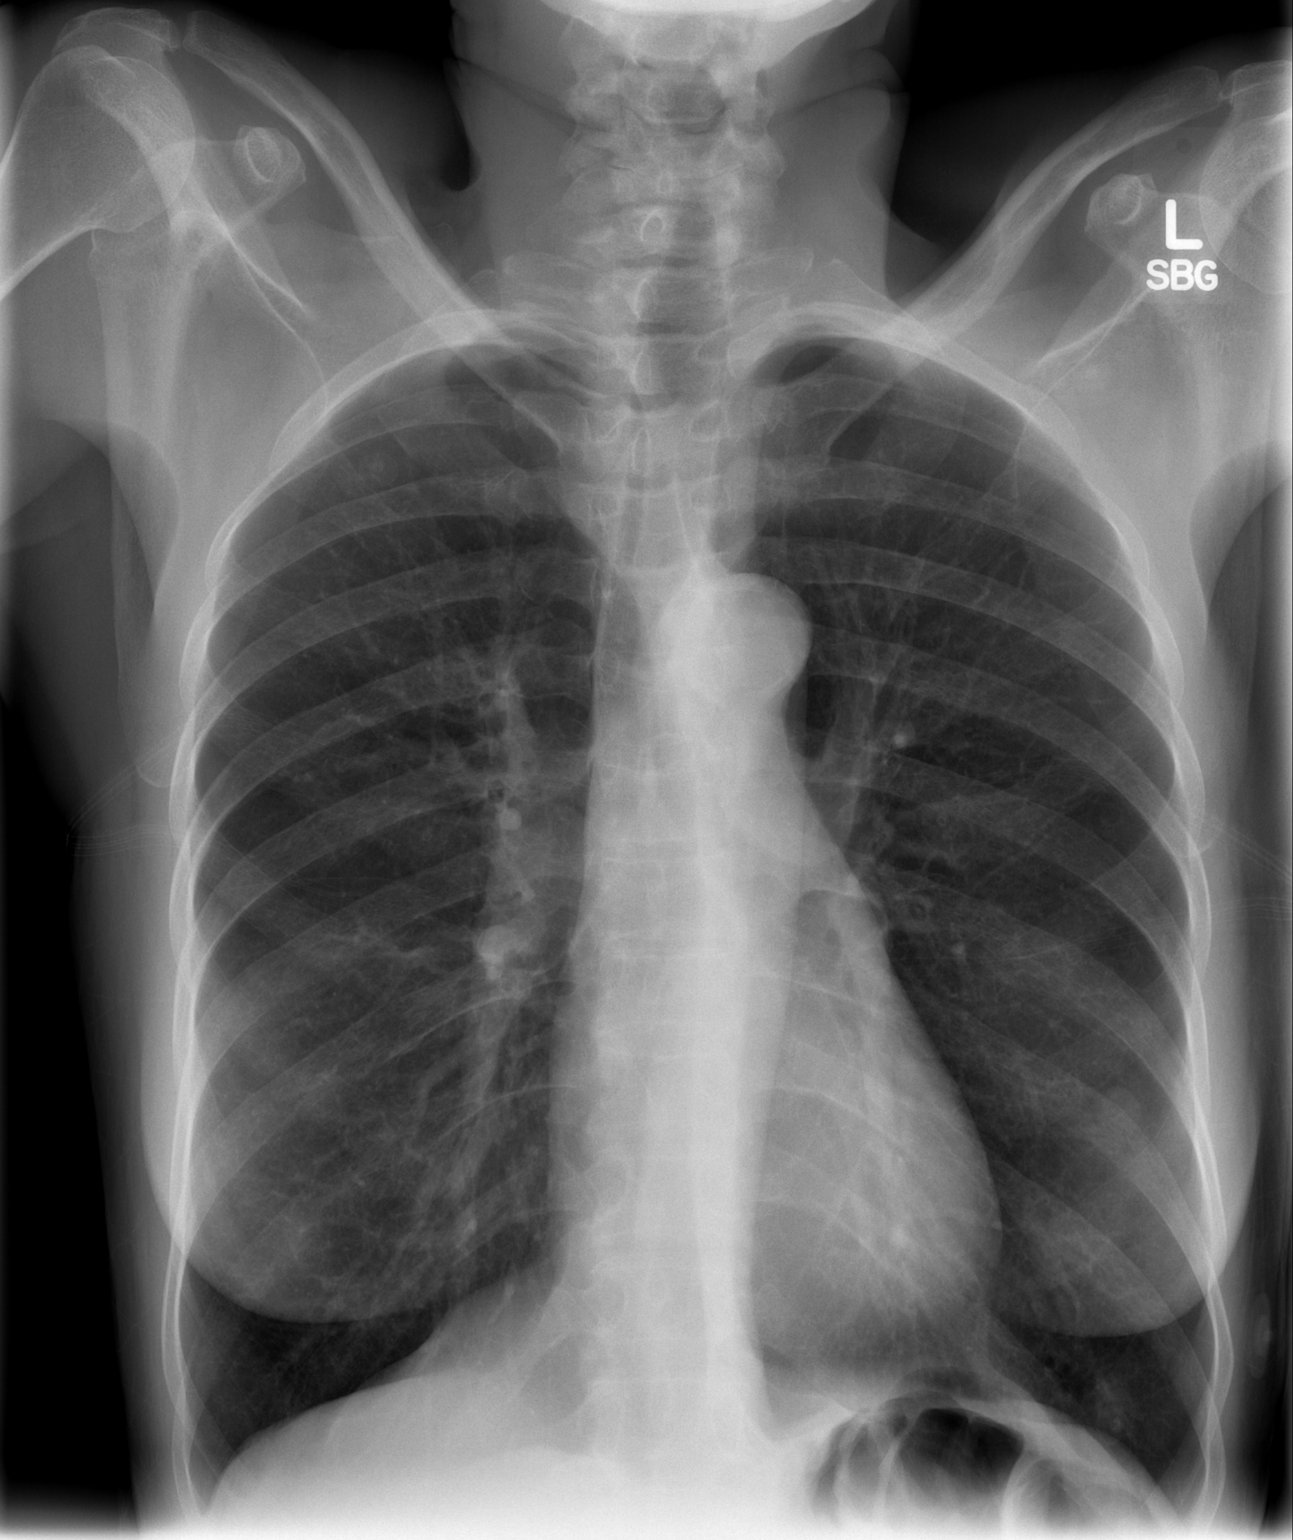

[w chest lat]
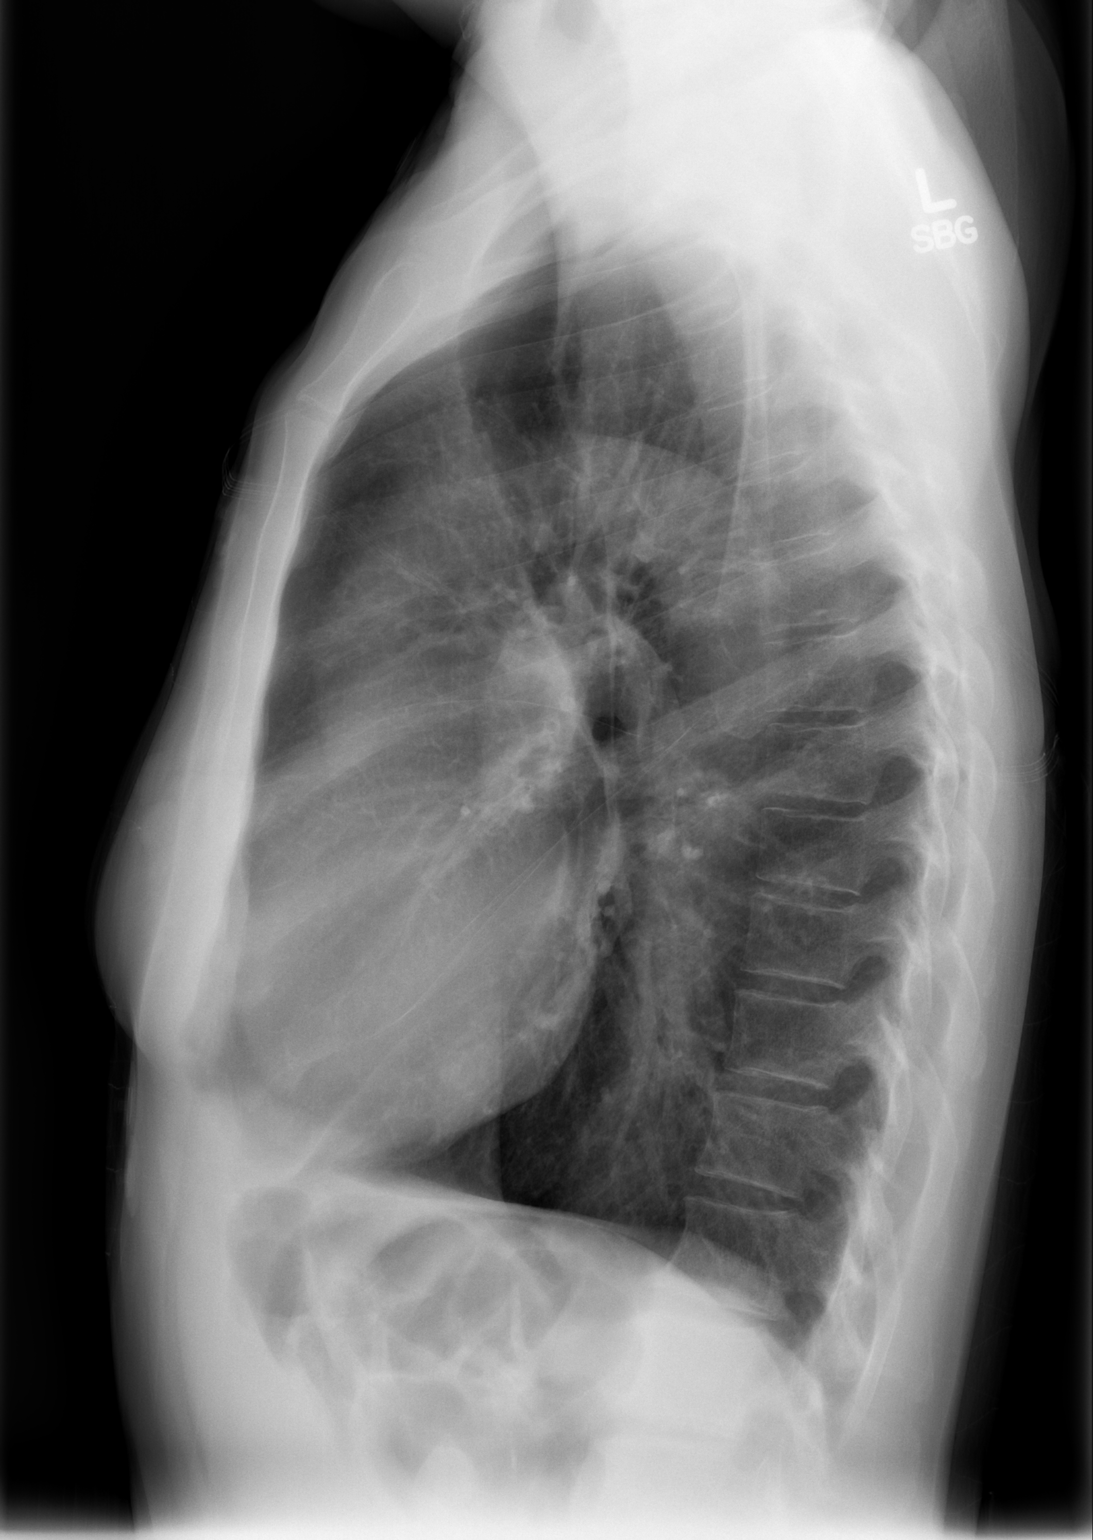

[2 of 2 positions shown; findings below may reference images not displayed]

FINDINGS: There is underlying emphysematous change. There is central
peribronchial thickening, stable. There is no edema or
consolidation. The heart size is normal. Pulmonary vascularity is
within normal limits. No adenopathy. No bone lesions.
IMPRESSION: Underlying emphysema. There is central bronchiolitis, stable. No
edema or consolidation.

Comment:  There is a nipple shadow on the left.

## 2015-05-18 MED ORDER — HYDROXYZINE HCL 25 MG PO TABS
25.0000 mg | ORAL_TABLET | Freq: Once | ORAL | Status: AC
  Administered 2015-05-18: 25 mg via ORAL
  Filled 2015-05-18: qty 1

## 2015-05-18 MED ORDER — TRIAMCINOLONE ACETONIDE 0.025 % EX OINT: 1.0000 "application " | TOPICAL_OINTMENT | Freq: Two times a day (BID) | CUTANEOUS | Status: DC

## 2015-05-18 MED ORDER — HYDROXYZINE HCL 25 MG PO TABS: 25.0000 mg | ORAL_TABLET | Freq: Four times a day (QID) | ORAL | Status: DC

## 2015-05-18 MED ORDER — PREDNISONE 20 MG PO TABS
60.0000 mg | ORAL_TABLET | Freq: Once | ORAL | Status: AC
  Administered 2015-05-18: 60 mg via ORAL
  Filled 2015-05-18: qty 3

## 2015-05-18 MED ORDER — PREDNISONE 10 MG (21) PO TBPK: 10.0000 mg | ORAL_TABLET | Freq: Every day | ORAL | Status: DC

## 2015-05-18 NOTE — Discharge Instructions (Signed)
Contact Dermatitis Follow up with your primary care physician in 2-3 days. Return for fever, surrounding redness or drainage. Contact dermatitis is a reaction to certain substances that touch the skin. Contact dermatitis can be either irritant contact dermatitis or allergic contact dermatitis. Irritant contact dermatitis does not require previous exposure to the substance for a reaction to occur.Allergic contact dermatitis only occurs if you have been exposed to the substance before. Upon a repeat exposure, your body reacts to the substance.  CAUSES  Many substances can cause contact dermatitis. Irritant dermatitis is most commonly caused by repeated exposure to mildly irritating substances, such as:  Makeup.  Soaps.  Detergents.  Bleaches.  Acids.  Metal salts, such as nickel. Allergic contact dermatitis is most commonly caused by exposure to:  Poisonous plants.  Chemicals (deodorants, shampoos).  Jewelry.  Latex.  Neomycin in triple antibiotic cream.  Preservatives in products, including clothing. SYMPTOMS  The area of skin that is exposed may develop:  Dryness or flaking.  Redness.  Cracks.  Itching.  Pain or a burning sensation.  Blisters. With allergic contact dermatitis, there may also be swelling in areas such as the eyelids, mouth, or genitals.  DIAGNOSIS  Your caregiver can usually tell what the problem is by doing a physical exam. In cases where the cause is uncertain and an allergic contact dermatitis is suspected, a patch skin test may be performed to help determine the cause of your dermatitis. TREATMENT Treatment includes protecting the skin from further contact with the irritating substance by avoiding that substance if possible. Barrier creams, powders, and gloves may be helpful. Your caregiver may also recommend:  Steroid creams or ointments applied 2 times daily. For best results, soak the rash area in cool water for 20 minutes. Then apply the  medicine. Cover the area with a plastic wrap. You can store the steroid cream in the refrigerator for a "chilly" effect on your rash. That may decrease itching. Oral steroid medicines may be needed in more severe cases.  Antibiotics or antibacterial ointments if a skin infection is present.  Antihistamine lotion or an antihistamine taken by mouth to ease itching.  Lubricants to keep moisture in your skin.  Burow's solution to reduce redness and soreness or to dry a weeping rash. Mix one packet or tablet of solution in 2 cups cool water. Dip a clean washcloth in the mixture, wring it out a bit, and put it on the affected area. Leave the cloth in place for 30 minutes. Do this as often as possible throughout the day.  Taking several cornstarch or baking soda baths daily if the area is too large to cover with a washcloth. Harsh chemicals, such as alkalis or acids, can cause skin damage that is like a burn. You should flush your skin for 15 to 20 minutes with cold water after such an exposure. You should also seek immediate medical care after exposure. Bandages (dressings), antibiotics, and pain medicine may be needed for severely irritated skin.  HOME CARE INSTRUCTIONS  Avoid the substance that caused your reaction.  Keep the area of skin that is affected away from hot water, soap, sunlight, chemicals, acidic substances, or anything else that would irritate your skin.  Do not scratch the rash. Scratching may cause the rash to become infected.  You may take cool baths to help stop the itching.  Only take over-the-counter or prescription medicines as directed by your caregiver.  See your caregiver for follow-up care as directed to make sure your  skin is healing properly. SEEK MEDICAL CARE IF:   Your condition is not better after 3 days of treatment.  You seem to be getting worse.  You see signs of infection such as swelling, tenderness, redness, soreness, or warmth in the affected  area.  You have any problems related to your medicines. Document Released: 08/06/2000 Document Revised: 11/01/2011 Document Reviewed: 01/12/2011 [facility] Patient Information 2015 [place], [place]. This information is not intended to replace advice given to you by your health care provider. Make sure you discuss any questions you have with your health care provider.

## 2015-05-18 NOTE — ED Notes (Signed)
PT is requesting a shot for rash .

## 2015-05-18 NOTE — ED Notes (Signed)
Declined W/C at D/C and was escorted to lobby by RN. 

## 2015-05-18 NOTE — ED Notes (Signed)
Pt. Stated, I was here already this week for the same.  Im having some type of chemical reaction to something I put on my hair,ibuprofen keep scratchjing my head.

## 2015-05-18 NOTE — ED Provider Notes (Signed)
CSN: [id number]     Arrival date & time 05/18/15  1017 History   First MD Initiated Contact with Patient 05/18/15 [phone]     Chief Complaint  Patient presents with  . Allergic Reaction     (Consider location/radiation/quality/duration/timing/severity/associated sxs/prior Treatment) Patient is a 64 y.o. female presenting with allergic reaction. The history is provided by the patient. No language interpreter was used.  Allergic Reaction Presenting symptoms: rash    [name] is a 64 year old female with a history of asthma, hypertension, A. fib, and migraines who presents for a rash on her scalp after having a perm 10 days ago. She states the rash has spread to her back and is itchy. She admits to scratching the area. She denies any relief from her triamcinolone cream and Claritin that she was prescribed 5 days ago in the ED for the same. She states last night she scratched fell over and made her scalp raw. She says that she tried Benadryl with little relief. She denies any fever, chills. She denies any new detergents, soaps, new medication.  Past Medical History  Diagnosis Date  . Asthma   . Hypertension   . GERD (gastroesophageal reflux disease)   . A-fib   . Migraine    Past Surgical History  Procedure Laterality Date  . Tubal ligation     Family History  Problem Relation Age of Onset  . Cancer Mother   . Cancer Father   . Heart attack Mother 50   Social History  Substance Use Topics  . Smoking status: Former Smoker    Types: Cigarettes    Quit date: 11/07/2011  . Smokeless tobacco: Never Used  . Alcohol Use: No   OB History    No data available     Review of Systems  Constitutional: Negative for fever and chills.  Respiratory: Negative for cough and shortness of breath.   Skin: Positive for rash.      Allergies  Pantoprazole and Celecoxib  Home Medications   Prior to Admission medications   Medication Sig Start Date End Date Taking? Authorizing Provider   albuterol (PROVENTIL HFA;VENTOLIN HFA) 108 (90 BASE) MCG/ACT inhaler Inhale 2 puffs into the lungs every 4 (four) hours as needed for wheezing or shortness of breath. 12/06/12   Montine Circle, PA-C  albuterol (PROVENTIL) (2.5 MG/3ML) 0.083% nebulizer solution Take 3 mLs (2.5 mg total) by nebulization every 6 (six) hours as needed for wheezing or shortness of breath. 07/07/14   Quintella Reichert, MD  benzonatate (TESSALON) 100 MG capsule Take 1 capsule (100 mg total) by mouth 3 (three) times daily as needed for cough. 06/07/14   Cherene Altes, MD  cetirizine (ZYRTEC) 10 MG tablet Take 10 mg by mouth daily as needed for allergies.    Historical Provider, MD  diltiazem (CARDIZEM CD) 120 MG 24 hr capsule Take 1 capsule (120 mg total) by mouth daily. Patient not taking: Reported on 07/07/2014 06/07/14   Cherene Altes, MD  fluticasone (FLOVENT HFA) 110 MCG/ACT inhaler Inhale 1 puff into the lungs 2 (two) times daily. 06/07/14   Cherene Altes, MD  hydrochlorothiazide (HYDRODIURIL) 25 MG tablet Take 25 mg by mouth daily. 06/11/14   Historical Provider, MD  hydrochlorothiazide (HYDRODIURIL) 25 MG tablet Take 1 tablet (25 mg total) by mouth daily. 08/14/14   Al Corpus, PA-C  HYDROcodone-acetaminophen (NORCO) 5-325 MG per tablet Take 1-2 tablets by mouth every 6 (six) hours as needed for moderate pain or severe pain. 05/23/14  Orlie Dakin, MD  hydrocortisone cream 1 % Apply to affected area 3 times daily 08/14/14   Al Corpus, PA-C  hydrOXYzine (ATARAX/VISTARIL) 25 MG tablet Take 1 tablet (25 mg total) by mouth every 6 (six) hours. 05/18/15   Vicki Pasqual Patel-Mills, PA-C  loratadine (CLARITIN) 10 MG tablet Take 1 tablet (10 mg total) by mouth daily. 05/13/15   Okey Regal, PA-C  metoCLOPramide (REGLAN) 10 MG tablet Take 1 tablet (10 mg total) by mouth every 6 (six) hours as needed for nausea (nausea/headache). 05/23/14   Orlie Dakin, MD  predniSONE (STERAPRED UNI-PAK 21 TAB) 10 MG (21)  TBPK tablet Take 1 tablet (10 mg total) by mouth daily. Take 6 tabs by mouth daily  for 2 days, then 5 tabs for 2 days, then 4 tabs for 2 days, then 3 tabs for 2 days, 2 tabs for 2 days, then 1 tab by mouth daily for 2 days 05/18/15   Ottie Glazier, PA-C  salmeterol (SEREVENT DISKUS) 50 MCG/DOSE diskus inhaler Inhale 1 puff into the lungs 2 (two) times daily. 06/07/14   Cherene Altes, MD  sulfamethoxazole-trimethoprim (SEPTRA DS) 800-160 MG per tablet Take 1 tablet by mouth every 12 (twelve) hours. 10/28/14   Wandra Arthurs, MD  tiotropium (SPIRIVA HANDIHALER) 18 MCG inhalation capsule Place 1 capsule (18 mcg total) into inhaler and inhale daily. 06/22/13   Lacretia Leigh, MD  triamcinolone (KENALOG) 0.025 % cream Apply 1 application topically 2 (two) times daily. 05/13/15   Jeffrey Hedges, PA-C   BP 137/55 mmHg  Pulse 65  Temp(Src) 97.9 F (36.6 C) (Oral)  Resp 14  Ht 5\' 7"  (1.702 m)  Wt 113 lb 6 oz (51.427 kg)  BMI 17.75 kg/m2  SpO2 98% Physical Exam  Constitutional: She is oriented to person, place, and time. She appears well-developed and well-nourished.  HENT:  Head: Atraumatic.  Eyes: Conjunctivae are normal.  Neck: Normal range of motion.  Cardiovascular: Normal rate.   Pulmonary/Chest: Effort normal. No respiratory distress.  Musculoskeletal: Normal range of motion.  Neurological: She is alert and oriented to person, place, and time.  Skin: Skin is warm and dry. Rash noted.  Macular non-erythematous-appearing rash on her left flank without drainage or surrounding signs of cellulitis. Her scalp has a pustular rash with active drainage after scratching last night but no surrounding erythema. No fluctuance or abscess formation. No deep tissue infection.  Psychiatric: She has a normal mood and affect. Her behavior is normal.  Nursing note and vitals reviewed.   ED Course  Procedures (including critical care time) Labs Review Labs Reviewed - No data to display  Imaging  Review No results found.    EKG Interpretation None      MDM   Final diagnoses:  Rash   Patient presents for rash after having a hair perm 10 days ago. Her vitals are stable and she is well-appearing. She has no signs of SJS, TEN, or deep skin infection. Rx: prednisone, vistaril Filed Vitals:   05/18/15 0702  BP: 137/55  Pulse: 65  Temp: 97.9 F (36.6 C)  Resp: 14   I discussed return precautions as well as follow-up with the patient. She verbally agrees with the plan.    Ottie Glazier, PA-C 05/18/15 1287  Merrily Pew, MD 05/19/15 [phone]

## 2015-05-20 IMAGING — CR DG ABDOMEN 2V
2 series · 2 of 2 positions shown · non-contrast
Comparison: 03/13/2010; correlation CT abdomen and pelvis
01/23/2013

CLINICAL DATA: Productive cough, shortness of breath for 1 week,
history asthma, hypertension, former smoker.

EXAM:
ABDOMEN - 2 VIEW

[w abdomen upright]
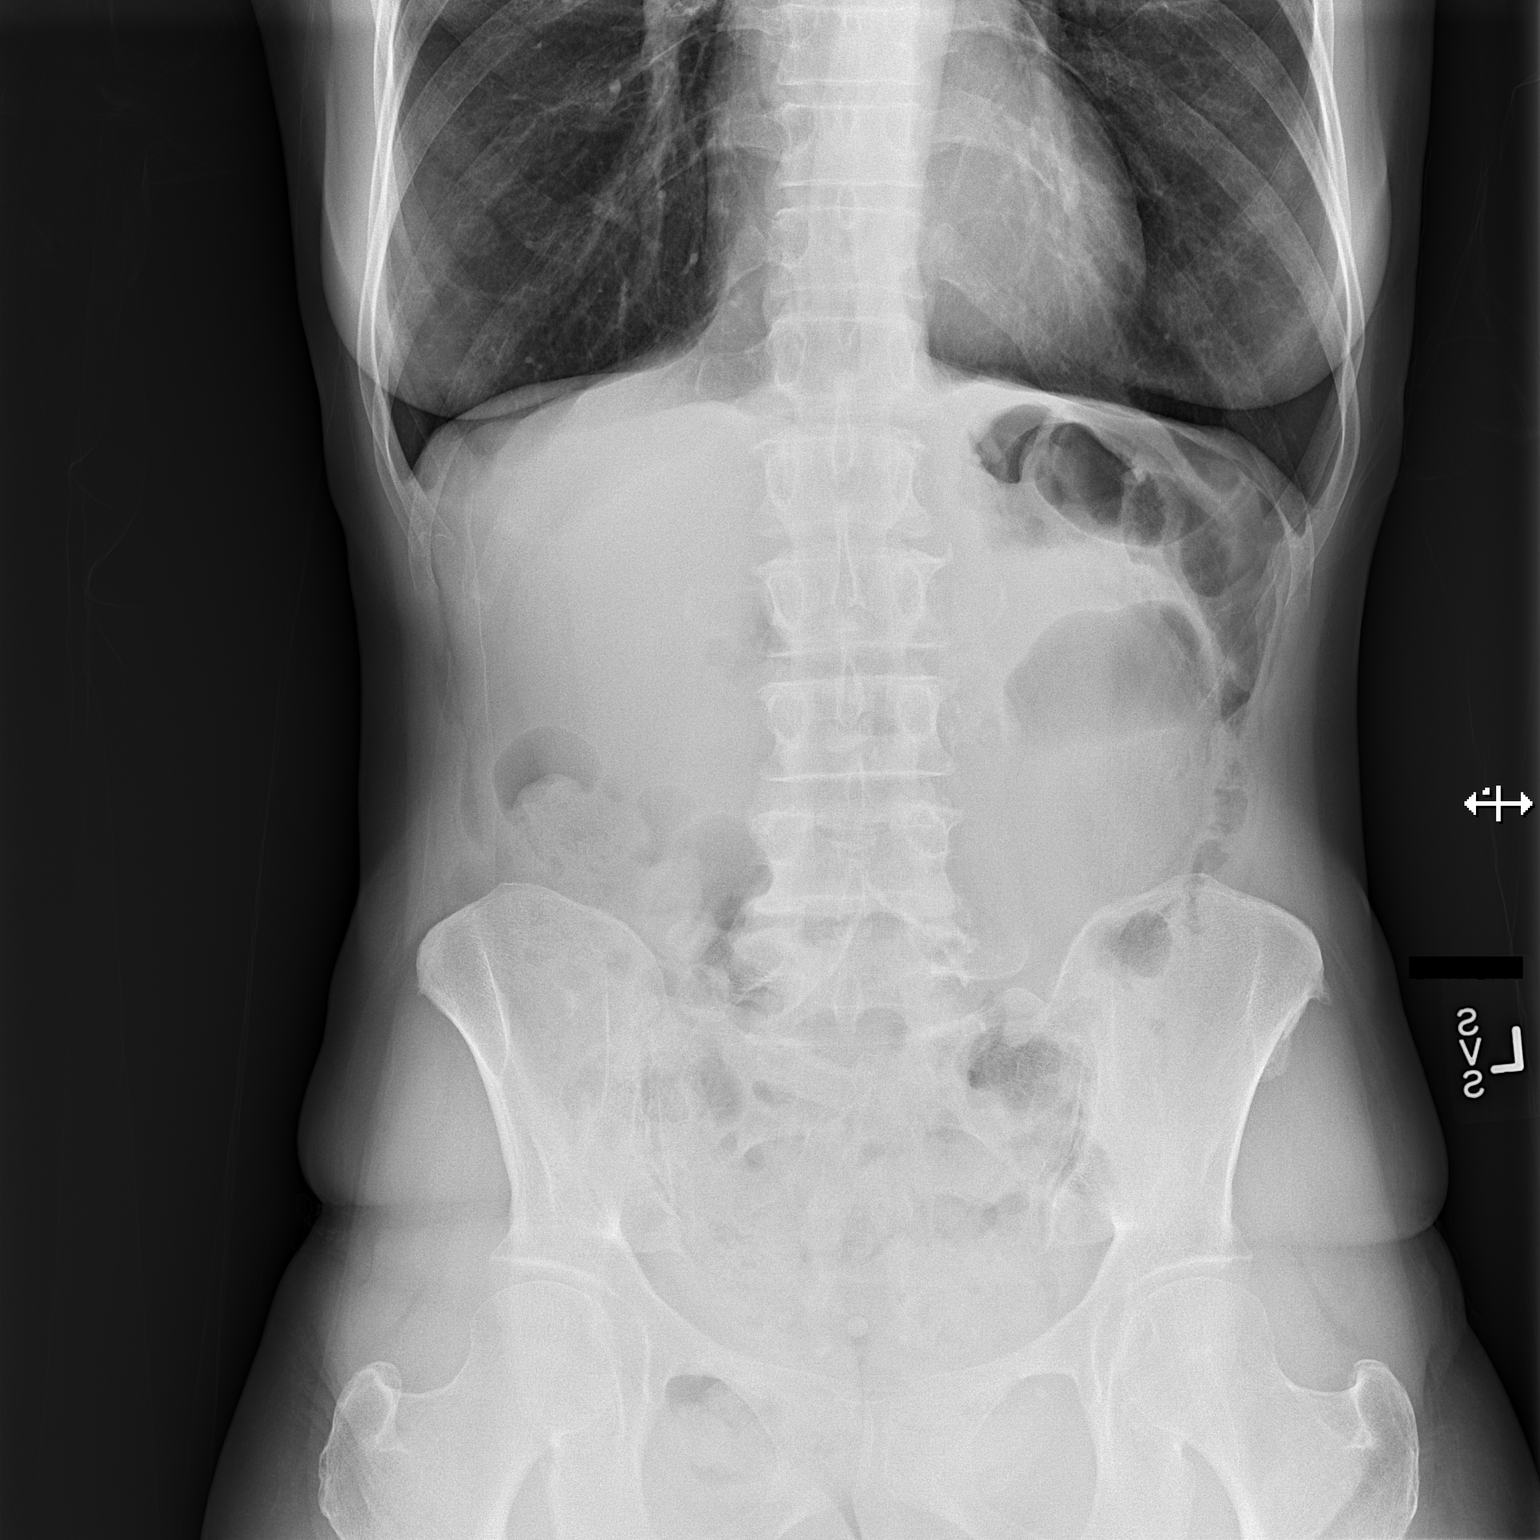

[t abdomen supine]
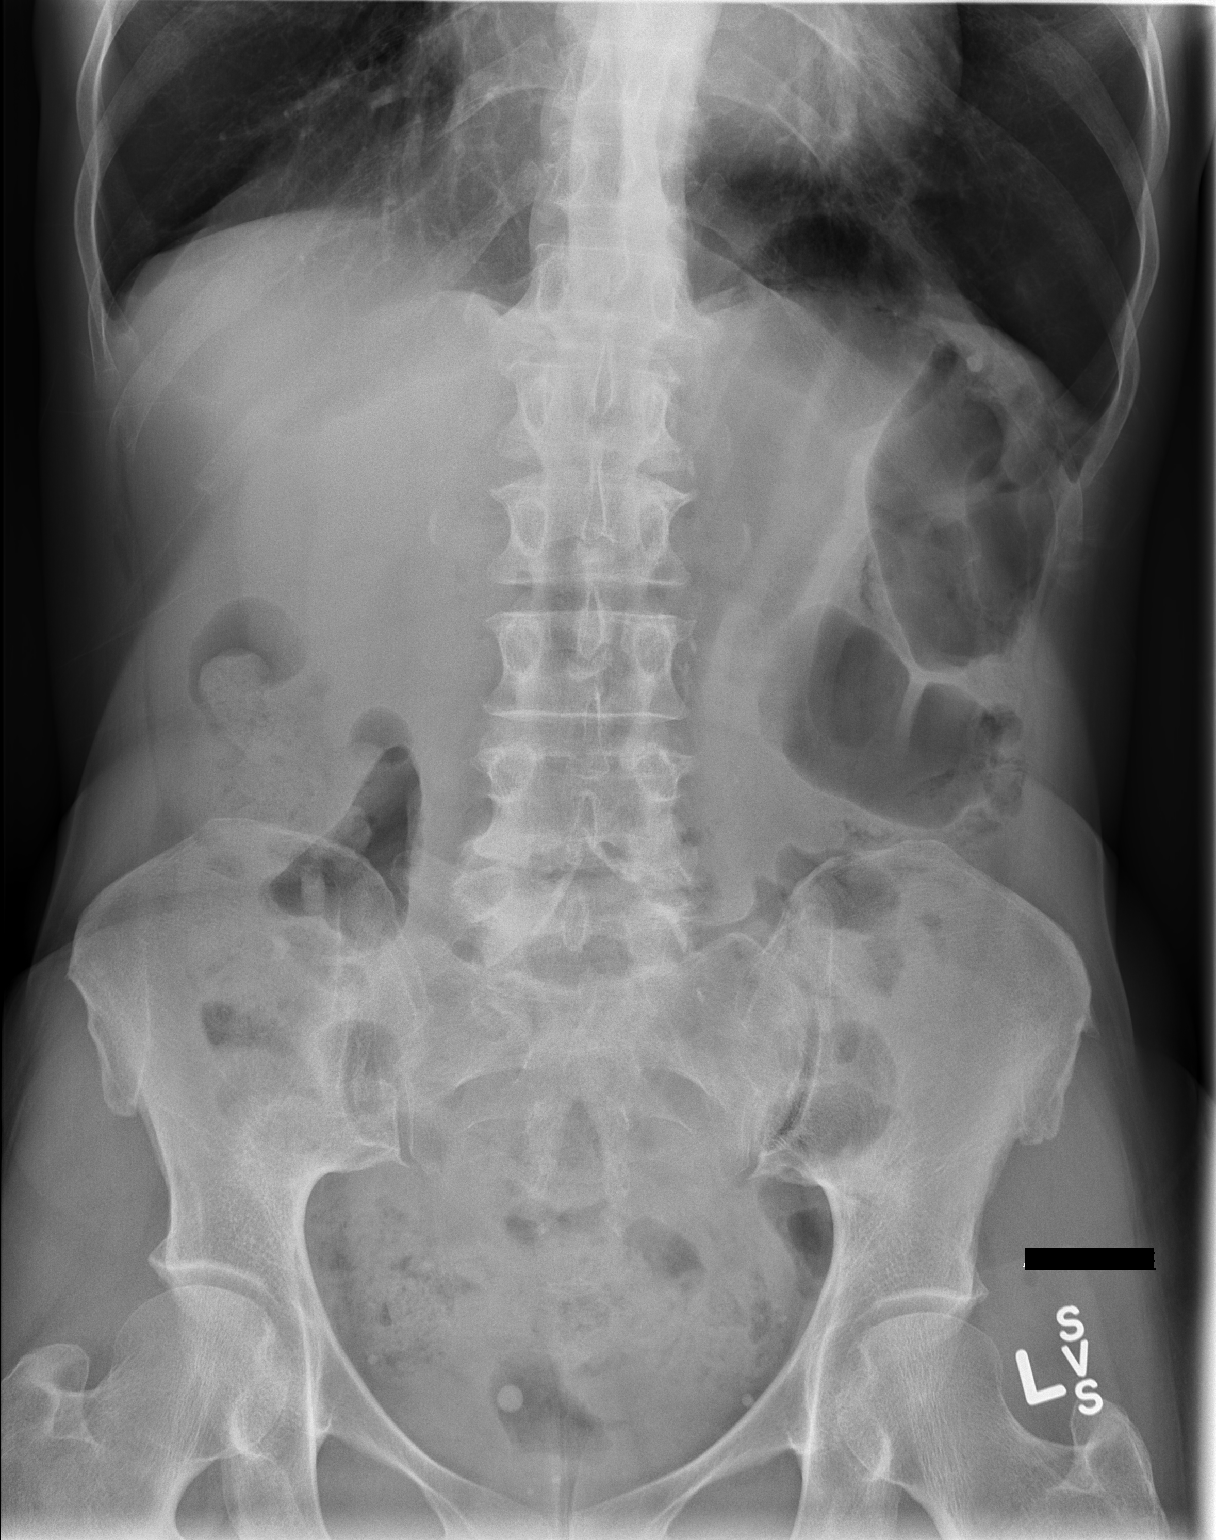

[2 of 2 positions shown; findings below may reference images not displayed]

FINDINGS: Lung bases hyperinflated but clear.

Nonobstructive bowel gas pattern.

No bowel dilatation, bowel wall thickening, or free intraperitoneal
air.

Scattered gas and stool throughout colon.

Pelvic phleboliths.

Osseous demineralization.

No urinary tract calcification.
IMPRESSION: Nonobstructive bowel gas pattern.

## 2015-05-20 IMAGING — CR DG CHEST 2V
2 series · 2 of 2 positions shown · non-contrast
Comparison: Chest x-ray 01/30/2014.

CLINICAL DATA: Productive cough.  Shortness breath.

EXAM:
CHEST  2 VIEW

[w chest pa]
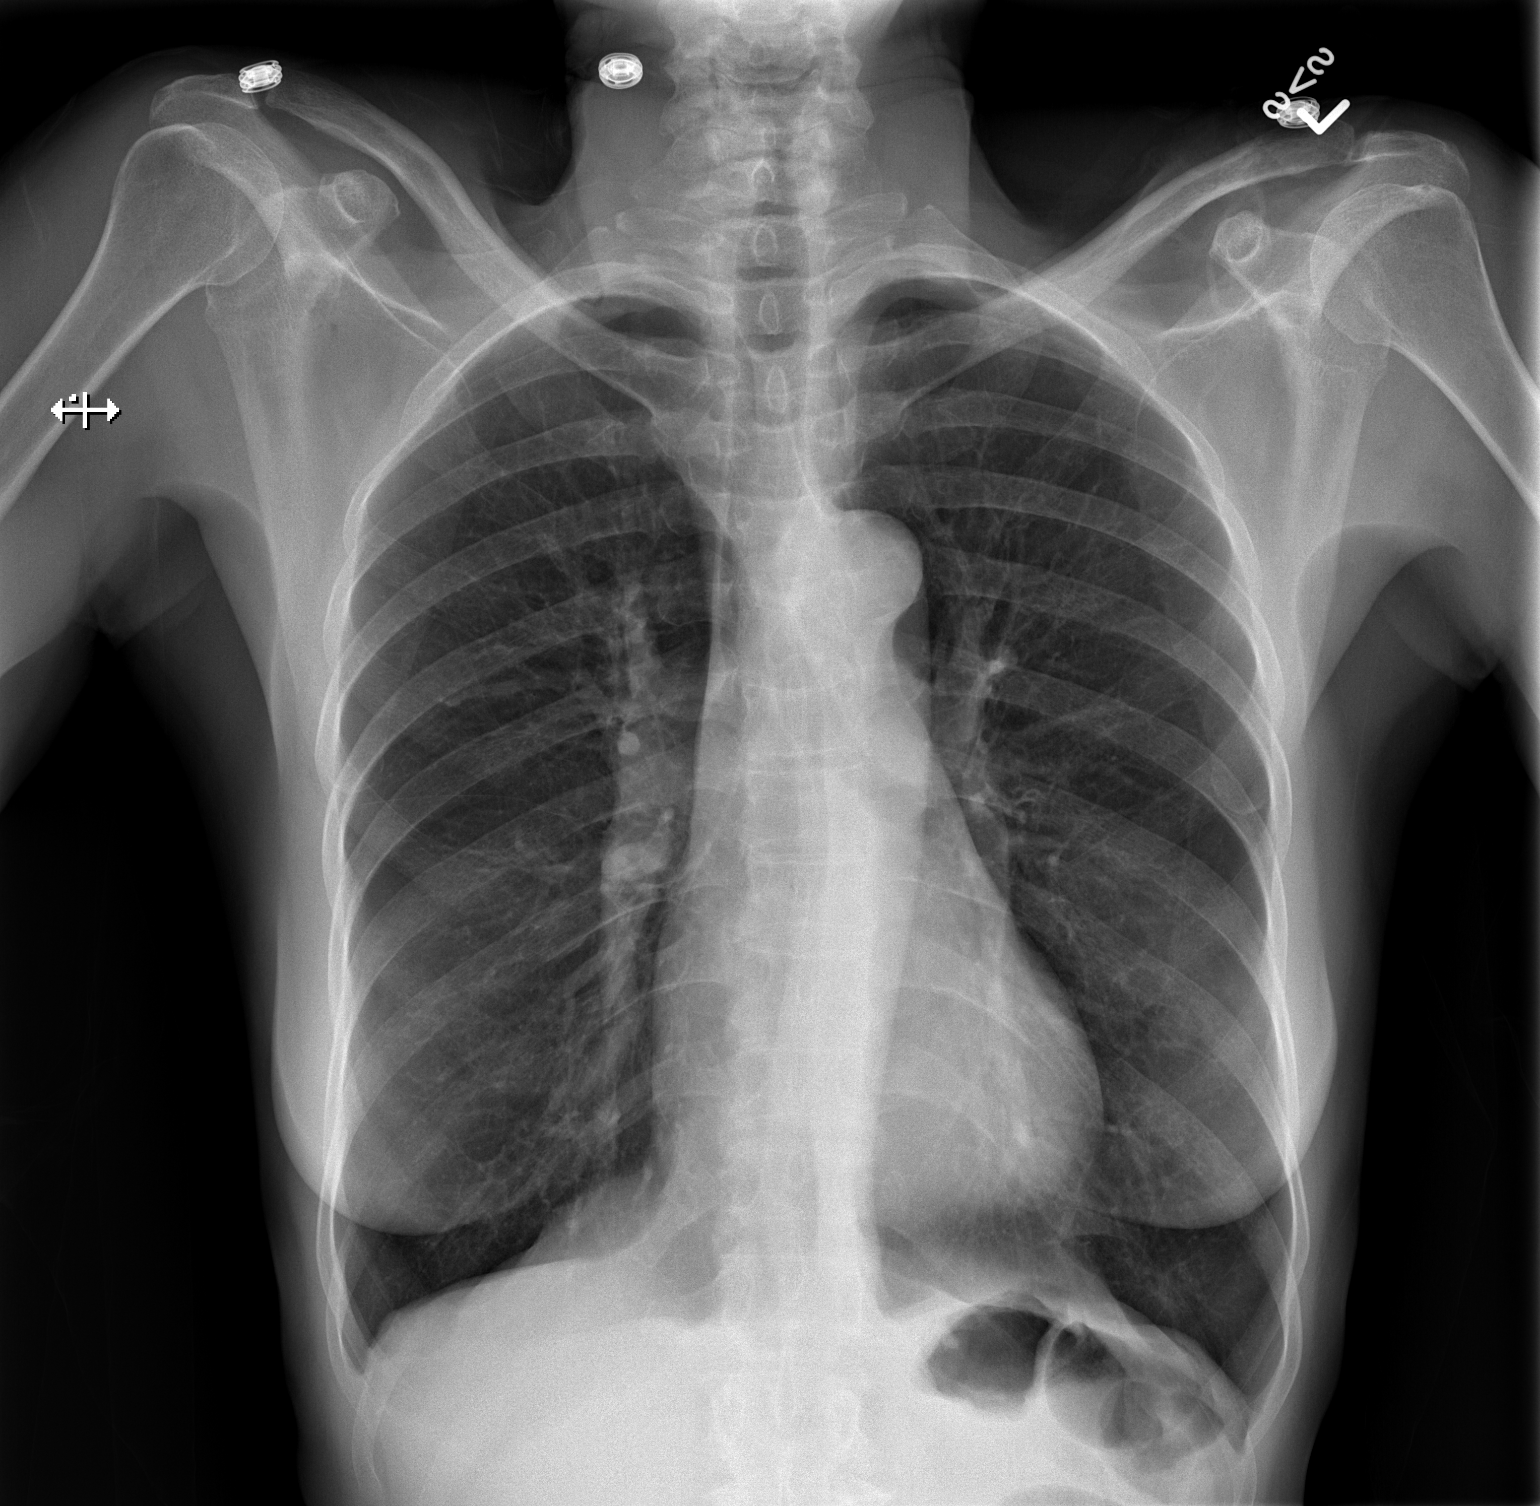

[w chest lat]
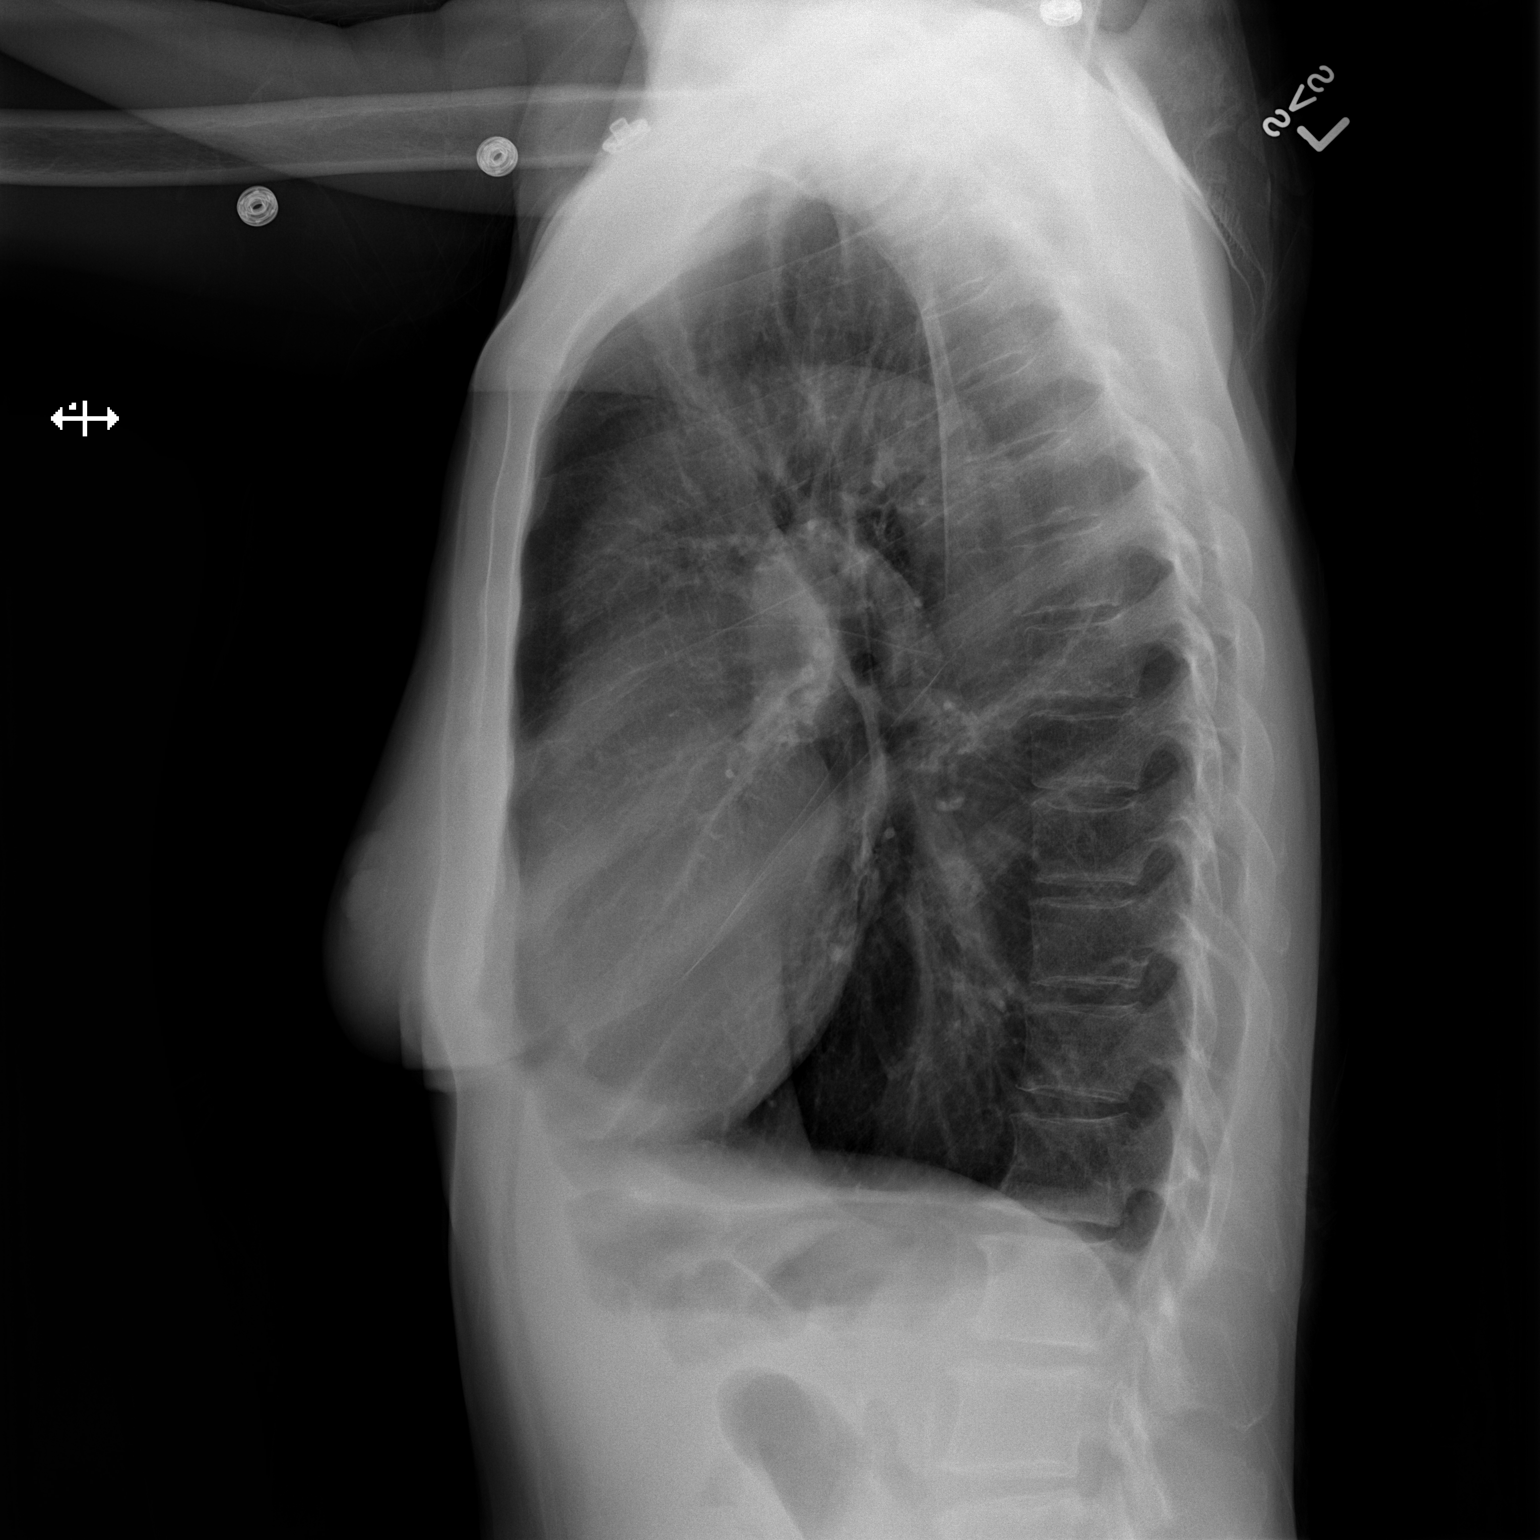

[2 of 2 positions shown; findings below may reference images not displayed]

FINDINGS: Lungs appear hyperexpanded with flattening of the hemidiaphragms,
increased retrosternal air space and pruning of the pulmonary
vasculature in the periphery, suggestive of underlying emphysema. No
acute consolidative airspace disease. No pleural effusions. No
evidence of pulmonary edema. Heart size and mediastinal contours are
within normal limits. Atherosclerosis in the thoracic aorta.
IMPRESSION: 1. Emphysematous changes redemonstrated, without definite evidence
of acute cardiopulmonary disease.
2. Atherosclerosis.

## 2015-06-01 ENCOUNTER — Encounter (HOSPITAL_COMMUNITY): Payer: Self-pay | Admitting: Emergency Medicine

## 2015-06-01 ENCOUNTER — Emergency Department (HOSPITAL_COMMUNITY)
Admission: EM | Admit: 2015-06-01 | Discharge: 2015-06-01 | Disposition: A | Discharge status: Home / Self Care | Payer: Medicare Other | Attending: Emergency Medicine | Admitting: Emergency Medicine

## 2015-06-01 DIAGNOSIS — J45909 Unspecified asthma, uncomplicated: Secondary | ICD-10-CM | POA: Diagnosis not present

## 2015-06-01 DIAGNOSIS — R21 Rash and other nonspecific skin eruption: Secondary | ICD-10-CM | POA: Diagnosis present

## 2015-06-01 DIAGNOSIS — Y9289 Other specified places as the place of occurrence of the external cause: Secondary | ICD-10-CM | POA: Diagnosis not present

## 2015-06-01 DIAGNOSIS — Z8719 Personal history of other diseases of the digestive system: Secondary | ICD-10-CM | POA: Insufficient documentation

## 2015-06-01 DIAGNOSIS — Z87891 Personal history of nicotine dependence: Secondary | ICD-10-CM | POA: Diagnosis not present

## 2015-06-01 DIAGNOSIS — Y9389 Activity, other specified: Secondary | ICD-10-CM | POA: Diagnosis not present

## 2015-06-01 DIAGNOSIS — Z7952 Long term (current) use of systemic steroids: Secondary | ICD-10-CM | POA: Diagnosis not present

## 2015-06-01 DIAGNOSIS — T22011A Burn of unspecified degree of right forearm, initial encounter: Secondary | ICD-10-CM | POA: Diagnosis not present

## 2015-06-01 DIAGNOSIS — Y998 Other external cause status: Secondary | ICD-10-CM | POA: Diagnosis not present

## 2015-06-01 DIAGNOSIS — L259 Unspecified contact dermatitis, unspecified cause: Secondary | ICD-10-CM | POA: Diagnosis not present

## 2015-06-01 DIAGNOSIS — Z79899 Other long term (current) drug therapy: Secondary | ICD-10-CM | POA: Insufficient documentation

## 2015-06-01 DIAGNOSIS — Y272XXA Contact with hot fluids, undetermined intent, initial encounter: Secondary | ICD-10-CM | POA: Insufficient documentation

## 2015-06-01 DIAGNOSIS — I10 Essential (primary) hypertension: Secondary | ICD-10-CM | POA: Diagnosis not present

## 2015-06-01 DIAGNOSIS — Z7951 Long term (current) use of inhaled steroids: Secondary | ICD-10-CM | POA: Insufficient documentation

## 2015-06-01 DIAGNOSIS — G43909 Migraine, unspecified, not intractable, without status migrainosus: Secondary | ICD-10-CM | POA: Diagnosis not present

## 2015-06-01 MED ORDER — BETAMETHASONE DIPROPIONATE 0.05 % EX CREA: TOPICAL_CREAM | Freq: Two times a day (BID) | CUTANEOUS | Status: DC

## 2015-06-01 MED ORDER — HYDROXYZINE HCL 25 MG PO TABS: 25.0000 mg | ORAL_TABLET | Freq: Four times a day (QID) | ORAL | Status: DC

## 2015-06-01 MED ORDER — TETANUS-DIPHTH-ACELL PERTUSSIS 5-2.5-18.5 LF-MCG/0.5 IM SUSP
0.5000 mL | Freq: Once | INTRAMUSCULAR | Status: AC
  Administered 2015-06-01: 0.5 mL via INTRAMUSCULAR
  Filled 2015-06-01: qty 0.5

## 2015-06-01 MED ORDER — DEXAMETHASONE SODIUM PHOSPHATE 10 MG/ML IJ SOLN
10.0000 mg | Freq: Once | INTRAMUSCULAR | Status: AC
  Administered 2015-06-01: 10 mg via INTRAMUSCULAR
  Filled 2015-06-01: qty 1

## 2015-06-01 MED ORDER — SILVER SULFADIAZINE 1 % EX CREA
TOPICAL_CREAM | Freq: Once | CUTANEOUS | Status: AC
  Administered 2015-06-01: 1 via TOPICAL
  Filled 2015-06-01: qty 50
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
## 2015-06-01 NOTE — ED Notes (Signed)
Awake. Verbally responsive. A/O x4. Resp even and unlabored. No audible adventitious breath sounds noted. ABC's intact.  
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
## 2015-06-01 NOTE — Discharge Instructions (Signed)
This is been going on for longer than a month. You need to see a dermatologist. Contact Dermatitis Dermatitis is redness, soreness, and swelling (inflammation) of the skin. Contact dermatitis is a reaction to certain substances that touch the skin. There are two types of contact dermatitis:   Irritant contact dermatitis. This type is caused by something that irritates your skin, such as dry hands from washing them too much. This type does not require previous exposure to the substance for a reaction to occur. This type is more common.  Allergic contact dermatitis. This type is caused by a substance that you are allergic to, such as a nickel allergy or poison ivy. This type only occurs if you have been exposed to the substance (allergen) before. Upon a repeat exposure, your body reacts to the substance. This type is less common. CAUSES  Many different substances can cause contact dermatitis. Irritant contact dermatitis is most commonly caused by exposure to:   Makeup.   Soaps.   Detergents.   Bleaches.   Acids.   Metal salts, such as nickel.  Allergic contact dermatitis is most commonly caused by exposure to:   Poisonous plants.   Chemicals.   Jewelry.   Latex.   Medicines.   Preservatives in products, such as clothing.  RISK FACTORS This condition is more likely to develop in:   People who have jobs that expose them to irritants or allergens.  People who have certain medical conditions, such as asthma or eczema.  SYMPTOMS  Symptoms of this condition may occur anywhere on your body where the irritant has touched you or is touched by you. Symptoms include:  Dryness or flaking.   Redness.   Cracks.   Itching.   Pain or a burning feeling.   Blisters.  Drainage of small amounts of blood or clear fluid from skin cracks. With allergic contact dermatitis, there may also be swelling in areas such as the eyelids, mouth, or genitals.  DIAGNOSIS  This  condition is diagnosed with a medical history and physical exam. A patch skin test may be performed to help determine the cause. If the condition is related to your job, you may need to see an occupational medicine specialist. TREATMENT Treatment for this condition includes figuring out what caused the reaction and protecting your skin from further contact. Treatment may also include:   Steroid creams or ointments. Oral steroid medicines may be needed in more severe cases.  Antibiotics or antibacterial ointments, if a skin infection is present.  Antihistamine lotion or an antihistamine taken by mouth to ease itching.  A bandage (dressing). HOME CARE INSTRUCTIONS Skin Care  Moisturize your skin as needed.   Apply cool compresses to the affected areas.  Try taking a bath with:  Epsom salts. Follow the instructions on the packaging. You can get these at your local pharmacy or grocery store.  Baking soda. Pour a small amount into the bath as directed by your health care provider.  Colloidal oatmeal. Follow the instructions on the packaging. You can get this at your local pharmacy or grocery store.  Try applying baking soda paste to your skin. Stir water into baking soda until it reaches a paste-like consistency.  Do not scratch your skin.  Bathe less frequently, such as every other day.  Bathe in lukewarm water. Avoid using hot water. Medicines  Take or apply over-the-counter and prescription medicines only as told by your health care provider.   If you were prescribed an antibiotic medicine, take  or apply your antibiotic as told by your health care provider. Do not stop using the antibiotic even if your condition starts to improve. General Instructions  Keep all follow-up visits as told by your health care provider. This is important.  Avoid the substance that caused your reaction. If you do not know what caused it, keep a journal to try to track what caused it. Write  down:  What you eat.  What cosmetic products you use.  What you drink.  What you wear in the affected area. This includes jewelry.  If you were given a dressing, take care of it as told by your health care provider. This includes when to change and remove it. SEEK MEDICAL CARE IF:   Your condition does not improve with treatment.  Your condition gets worse.  You have signs of infection such as swelling, tenderness, redness, soreness, or warmth in the affected area.  You have a fever.  You have new symptoms. SEEK IMMEDIATE MEDICAL CARE IF:   You have a severe headache, neck pain, or neck stiffness.  You vomit.  You feel very sleepy.  You notice red streaks coming from the affected area.  Your bone or joint underneath the affected area becomes painful after the skin has healed.  The affected area turns darker.  You have difficulty breathing.   This information is not intended to replace advice given to you by your health care provider. Make sure you discuss any questions you have with your health care provider.   Document Released: 08/06/2000 Document Revised: 04/30/2015 Document Reviewed: 12/25/2014 Elsevier Interactive Patient Education [employer].

## 2015-06-09 ENCOUNTER — Encounter (HOSPITAL_COMMUNITY): Payer: Self-pay | Admitting: Family Medicine

## 2015-06-09 ENCOUNTER — Emergency Department (HOSPITAL_COMMUNITY): Payer: Medicare Other

## 2015-06-09 ENCOUNTER — Inpatient Hospital Stay (HOSPITAL_COMMUNITY)
Admission: EM | Admit: 2015-06-09 | Discharge: 2015-06-10 | DRG: 310 | Disposition: A | Discharge status: Home / Self Care | Payer: Medicare Other | Attending: Family Medicine | Admitting: Family Medicine

## 2015-06-09 DIAGNOSIS — Z8249 Family history of ischemic heart disease and other diseases of the circulatory system: Secondary | ICD-10-CM | POA: Diagnosis not present

## 2015-06-09 DIAGNOSIS — Z87891 Personal history of nicotine dependence: Secondary | ICD-10-CM

## 2015-06-09 DIAGNOSIS — I10 Essential (primary) hypertension: Secondary | ICD-10-CM | POA: Diagnosis present

## 2015-06-09 DIAGNOSIS — J45909 Unspecified asthma, uncomplicated: Secondary | ICD-10-CM | POA: Diagnosis present

## 2015-06-09 DIAGNOSIS — J209 Acute bronchitis, unspecified: Secondary | ICD-10-CM | POA: Diagnosis present

## 2015-06-09 DIAGNOSIS — Z888 Allergy status to other drugs, medicaments and biological substances status: Secondary | ICD-10-CM | POA: Diagnosis not present

## 2015-06-09 DIAGNOSIS — I480 Paroxysmal atrial fibrillation: Secondary | ICD-10-CM | POA: Diagnosis present

## 2015-06-09 DIAGNOSIS — Z599 Problem related to housing and economic circumstances, unspecified: Secondary | ICD-10-CM | POA: Diagnosis not present

## 2015-06-09 DIAGNOSIS — E876 Hypokalemia: Secondary | ICD-10-CM | POA: Diagnosis present

## 2015-06-09 DIAGNOSIS — K219 Gastro-esophageal reflux disease without esophagitis: Secondary | ICD-10-CM | POA: Diagnosis present

## 2015-06-09 DIAGNOSIS — I4891 Unspecified atrial fibrillation: Secondary | ICD-10-CM | POA: Diagnosis not present

## 2015-06-09 DIAGNOSIS — Z809 Family history of malignant neoplasm, unspecified: Secondary | ICD-10-CM

## 2015-06-09 DIAGNOSIS — J00 Acute nasopharyngitis [common cold]: Secondary | ICD-10-CM | POA: Diagnosis not present

## 2015-06-09 LAB — BASIC METABOLIC PANEL
Anion gap: 11 (ref 5–15)
BUN: 8 mg/dL (ref 6–20)
CALCIUM: 10.3 mg/dL (ref 8.9–10.3)
CO2: 23 mmol/L (ref 22–32)
CREATININE: 0.72 mg/dL (ref 0.44–1.00)
Chloride: 102 mmol/L (ref 101–111)
GFR calc non Af Amer: >60 mL/min (ref >60)
GLUCOSE: 95 mg/dL (ref 65–99)
Potassium: 3.1 mmol/L — ABNORMAL LOW (ref 3.5–5.1)
Sodium: 136 mmol/L (ref 135–145)

## 2015-06-09 LAB — CBC
HCT: 43.5 % (ref 36.0–46.0)
Hemoglobin: 14.9 g/dL (ref 12.0–15.0)
MCH: 27.2 pg (ref 26.0–34.0)
MCHC: 34.3 g/dL (ref 30.0–36.0)
MCV: 79.4 fL (ref 78.0–100.0)
PLATELETS: 340 10*3/uL (ref 150–400)
RBC: 5.48 MIL/uL — AB (ref 3.87–5.11)
RDW: 16.1 % — ABNORMAL HIGH (ref 11.5–15.5)
WBC: 5.6 10*3/uL (ref 4.0–10.5)

## 2015-06-09 LAB — MAGNESIUM: Magnesium: 2.1 mg/dL (ref 1.7–2.4)

## 2015-06-09 LAB — MRSA PCR SCREENING: MRSA by PCR: NEGATIVE

## 2015-06-09 LAB — I-STAT TROPONIN, ED: TROPONIN I, POC: 0 ng/mL (ref 0.00–0.08)

## 2015-06-09 MED ORDER — SALMETEROL XINAFOATE 50 MCG/DOSE IN AEPB
1.0000 | INHALATION_SPRAY | Freq: Two times a day (BID) | RESPIRATORY_TRACT | Status: DC
  Administered 2015-06-10 (×2): 1 via RESPIRATORY_TRACT
  Filled 2015-06-09: qty 0

## 2015-06-09 MED ORDER — HYDROCHLOROTHIAZIDE 25 MG PO TABS
25.0000 mg | ORAL_TABLET | Freq: Every day | ORAL | Status: DC
  Administered 2015-06-10: 25 mg via ORAL
  Filled 2015-06-09: qty 1

## 2015-06-09 MED ORDER — DILTIAZEM HCL 100 MG IV SOLR
5.0000 mg/h | INTRAVENOUS | Status: DC
  Administered 2015-06-09: 5 mg/h via INTRAVENOUS
  Filled 2015-06-09: qty 100

## 2015-06-09 MED ORDER — IPRATROPIUM BROMIDE 0.02 % IN SOLN
0.5000 mg | Freq: Four times a day (QID) | RESPIRATORY_TRACT | Status: DC
  Administered 2015-06-09 – 2015-06-10 (×2): 0.5 mg via RESPIRATORY_TRACT
  Filled 2015-06-09 (×3): qty 2.5

## 2015-06-09 MED ORDER — ACETAMINOPHEN 650 MG RE SUPP: 650.0000 mg | Freq: Four times a day (QID) | RECTAL | Status: DC | PRN

## 2015-06-09 MED ORDER — ACETAMINOPHEN 325 MG PO TABS
650.0000 mg | ORAL_TABLET | Freq: Four times a day (QID) | ORAL | Status: DC | PRN
  Administered 2015-06-09: 650 mg via ORAL
  Filled 2015-06-09: qty 2

## 2015-06-09 MED ORDER — MAGNESIUM SULFATE IN D5W 10-5 MG/ML-% IV SOLN
1.0000 g | Freq: Once | INTRAVENOUS | Status: AC
  Administered 2015-06-09: 1 g via INTRAVENOUS
  Filled 2015-06-09: qty 100

## 2015-06-09 MED ORDER — LORATADINE 10 MG PO TABS
10.0000 mg | ORAL_TABLET | Freq: Every day | ORAL | Status: DC
  Administered 2015-06-10: 10 mg via ORAL
  Filled 2015-06-09: qty 1

## 2015-06-09 MED ORDER — SODIUM CHLORIDE 0.9 % IJ SOLN: 3.0000 mL | Freq: Two times a day (BID) | INTRAMUSCULAR | Status: DC

## 2015-06-09 MED ORDER — PNEUMOCOCCAL VAC POLYVALENT 25 MCG/0.5ML IJ INJ
0.5000 mL | INJECTION | INTRAMUSCULAR | Status: AC
  Administered 2015-06-10: 0.5 mL via INTRAMUSCULAR
  Filled 2015-06-09: qty 0.5

## 2015-06-09 MED ORDER — DILTIAZEM HCL 100 MG IV SOLR
INTRAVENOUS | Status: AC
  Filled 2015-06-09: qty 100

## 2015-06-09 MED ORDER — FLUTICASONE PROPIONATE 50 MCG/ACT NA SUSP: 2.0000 | Freq: Once | NASAL | Status: DC

## 2015-06-09 MED ORDER — LEVALBUTEROL HCL 0.63 MG/3ML IN NEBU: 0.6300 mg | INHALATION_SOLUTION | Freq: Four times a day (QID) | RESPIRATORY_TRACT | Status: DC | PRN

## 2015-06-09 MED ORDER — ACETAMINOPHEN 325 MG PO TABS
650.0000 mg | ORAL_TABLET | Freq: Four times a day (QID) | ORAL | Status: DC | PRN
  Administered 2015-06-10: 650 mg via ORAL
  Filled 2015-06-09: qty 2

## 2015-06-09 MED ORDER — ONDANSETRON HCL 4 MG/2ML IJ SOLN: 4.0000 mg | Freq: Four times a day (QID) | INTRAMUSCULAR | Status: DC | PRN

## 2015-06-09 MED ORDER — LEVALBUTEROL HCL 0.63 MG/3ML IN NEBU
0.6300 mg | INHALATION_SOLUTION | Freq: Four times a day (QID) | RESPIRATORY_TRACT | Status: DC
  Administered 2015-06-09 – 2015-06-10 (×2): 0.63 mg via RESPIRATORY_TRACT
  Filled 2015-06-09 (×3): qty 3

## 2015-06-09 MED ORDER — DILTIAZEM LOAD VIA INFUSION
15.0000 mg | Freq: Once | INTRAVENOUS | Status: AC
  Administered 2015-06-09: 15 mg via INTRAVENOUS
  Filled 2015-06-09: qty 15

## 2015-06-09 MED ORDER — ENSURE ENLIVE PO LIQD
237.0000 mL | Freq: Two times a day (BID) | ORAL | Status: DC
  Administered 2015-06-10: 237 mL via ORAL

## 2015-06-09 MED ORDER — TRIAMCINOLONE ACETONIDE 0.025 % EX CREA
1.0000 "application " | TOPICAL_CREAM | Freq: Two times a day (BID) | CUTANEOUS | Status: DC
  Administered 2015-06-10: 1 via TOPICAL
  Filled 2015-06-09: qty 15

## 2015-06-09 MED ORDER — BUDESONIDE 0.5 MG/2ML IN SUSP
0.5000 mg | Freq: Two times a day (BID) | RESPIRATORY_TRACT | Status: DC
  Administered 2015-06-09 – 2015-06-10 (×2): 0.5 mg via RESPIRATORY_TRACT
  Filled 2015-06-09 (×2): qty 2

## 2015-06-09 MED ORDER — DILTIAZEM HCL 60 MG PO TABS
60.0000 mg | ORAL_TABLET | Freq: Three times a day (TID) | ORAL | Status: DC
Start: 2015-06-09 — End: 2015-06-10
  Administered 2015-06-09 – 2015-06-10 (×2): 60 mg via ORAL
  Filled 2015-06-09 (×2): qty 1

## 2015-06-09 MED ORDER — HYDROXYZINE HCL 25 MG PO TABS
25.0000 mg | ORAL_TABLET | Freq: Four times a day (QID) | ORAL | Status: DC | PRN
  Administered 2015-06-10: 25 mg via ORAL
  Filled 2015-06-09: qty 1

## 2015-06-09 MED ORDER — ONDANSETRON HCL 4 MG PO TABS: 4.0000 mg | ORAL_TABLET | Freq: Four times a day (QID) | ORAL | Status: DC | PRN

## 2015-06-09 MED ORDER — POTASSIUM CHLORIDE CRYS ER 20 MEQ PO TBCR
40.0000 meq | EXTENDED_RELEASE_TABLET | Freq: Once | ORAL | Status: AC
  Administered 2015-06-09: 40 meq via ORAL
  Filled 2015-06-09: qty 2

## 2015-06-09 MED ORDER — HEPARIN (PORCINE) IN NACL 100-0.45 UNIT/ML-% IJ SOLN
850.0000 [IU]/h | INTRAMUSCULAR | Status: DC
  Administered 2015-06-09: 750 [IU]/h via INTRAVENOUS
  Filled 2015-06-09: qty 250

## 2015-06-09 MED ORDER — HYDROCORTISONE 1 % EX CREA
TOPICAL_CREAM | Freq: Two times a day (BID) | CUTANEOUS | Status: DC
  Administered 2015-06-10: 11:00:00 via TOPICAL
  Filled 2015-06-09: qty 28

## 2015-06-09 MED ORDER — FLUTICASONE PROPIONATE 50 MCG/ACT NA SUSP
2.0000 | Freq: Every day | NASAL | Status: DC
  Filled 2015-06-09: qty 16

## 2015-06-09 MED ORDER — PREDNISONE 20 MG PO TABS: 60.0000 mg | ORAL_TABLET | Freq: Once | ORAL | Status: DC

## 2015-06-09 MED ORDER — HEPARIN BOLUS VIA INFUSION
2500.0000 [IU] | Freq: Once | INTRAVENOUS | Status: AC
  Administered 2015-06-09: 2500 [IU] via INTRAVENOUS
  Filled 2015-06-09: qty 2500

## 2015-06-09 MED ORDER — POTASSIUM CHLORIDE 10 MEQ/100ML IV SOLN
10.0000 meq | Freq: Once | INTRAVENOUS | Status: AC
  Administered 2015-06-09: 10 meq via INTRAVENOUS
  Filled 2015-06-09: qty 100

## 2015-06-09 NOTE — ED Notes (Signed)
Report attempted 

## 2015-06-09 NOTE — H&P (Signed)
[facility] History and Physical  [name] AYT:[id number] DOB: [birth year] DOA: 06/09/2015  Referring physician: Dr. Sabino Niemann. PCP: Willey Blade, MD  Specialists: None.  Chief Complaint: Shortness of breath. Palpitations.  HPI: [name] is a 64 y.o. female with history of atrial fibrillation not on any anticoagulants and hypertension and bronchitis presents to the ER because of shortness of breath and palpitations. Patient states over the last 2-3 days patient has been having upper respiratory tract infection symptoms and had taken over-the-counter medications which includes decongestant phenylephrine. Patient over the last 2 weeks also has been having some allergic reaction for which patient was prescribed topical steroids and loratadine. In the ER patient was found to be in A. fib with RVR and was started on Cardizem bolus followed by infusion and by the time I examined the patient patient's rhythm was converted to a sinus rhythm. Patient states she has been prescribed anticoagulants last year but stopped taking since patient had some numbness and also had financial issues. Patient on exam has mild wheezing. Has pleuritic-type of chest pain but currently chest pain-free.   Review of Systems: As presented in the history of presenting illness, rest negative.  Past Medical History  Diagnosis Date  . Asthma   . Hypertension   . GERD (gastroesophageal reflux disease)   . A-fib (HCC)   . Migraine    Past Surgical History  Procedure Laterality Date  . Tubal ligation     Social History:  reports that she quit smoking about 3 years ago. Her smoking use included Cigarettes. She has never used smokeless tobacco. She reports that she does not drink alcohol or use illicit drugs. Where does patient live at home. Can patient participate in ADLs? Yes.  Allergies  Allergen Reactions  . Pantoprazole Nausea And Vomiting and Other (See Comments)    dizziness  . Celecoxib Rash     Family History:  Family History  Problem Relation Age of Onset  . Cancer Mother   . Cancer Father   . Heart attack Mother 46      Prior to Admission medications   Medication Sig Start Date End Date Taking? Authorizing Provider  albuterol (PROVENTIL HFA;VENTOLIN HFA) 108 (90 BASE) MCG/ACT inhaler Inhale 2 puffs into the lungs every 4 (four) hours as needed for wheezing or shortness of breath. 12/06/12  Yes Roxy Horseman, PA-C  albuterol (PROVENTIL) (2.5 MG/3ML) 0.083% nebulizer solution Take 3 mLs (2.5 mg total) by nebulization every 6 (six) hours as needed for wheezing or shortness of breath. 07/07/14  Yes Tilden Fossa, MD  fluticasone (FLOVENT HFA) 110 MCG/ACT inhaler Inhale 1 puff into the lungs 2 (two) times daily. 06/07/14  Yes Lonia Blood, MD  hydrochlorothiazide (HYDRODIURIL) 25 MG tablet Take 25 mg by mouth daily. 06/11/14  Yes Historical Provider, MD  hydrocortisone cream 1 % Apply to affected area 3 times daily 08/14/14  Yes Oswaldo Conroy, PA-C  hydrOXYzine (ATARAX/VISTARIL) 25 MG tablet Take 1 tablet (25 mg total) by mouth every 6 (six) hours. 06/01/15  Yes Melene Plan, DO  loratadine (CLARITIN) 10 MG tablet Take 1 tablet (10 mg total) by mouth daily. 05/13/15  Yes Jeffrey Hedges, PA-C  salmeterol (SEREVENT DISKUS) 50 MCG/DOSE diskus inhaler Inhale 1 puff into the lungs 2 (two) times daily. 06/07/14  Yes Lonia Blood, MD  tiotropium (SPIRIVA HANDIHALER) 18 MCG inhalation capsule Place 1 capsule (18 mcg total) into inhaler and inhale daily. 06/22/13  Yes Lorre Nick, MD  triamcinolone (KENALOG) 0.025 %  ointment Apply 1 application topically 2 (two) times daily. 05/18/15  Yes Hanna Patel-Mills, PA-C  sulfamethoxazole-trimethoprim (SEPTRA DS) 800-160 MG per tablet Take 1 tablet by mouth every 12 (twelve) hours. 10/28/14   Richardean Canal, MD    Physical Exam: Daiva Eves:   06/09/15 2000 06/09/15 2015 06/09/15 2030 06/09/15 2123  BP: 133/88  144/89   Pulse: 76 84 80    Temp:    98.4 F (36.9 C)  TempSrc:    Oral  Resp: 24 19    Height:    5\' 7"  (1.702 m)  Weight:    49.533 kg (109 lb 3.2 oz)  SpO2: 92% 95% 95%      General:  Moderately built and nourished.  Eyes: Anicteric. No pallor.  ENT: No discharge from the ears eyes nose and mouth.  Neck: No mass felt. No JVD appreciated.  Cardiovascular: S1 and S2 heard.  Respiratory: Bilateral expiratory wheeze heard no crepitations.  Abdomen: Soft nontender bowel sounds present.  Skin: No rash.  Musculoskeletal: No edema.  Psychiatric: Appears normal.  Neurologic: Alert awake oriented to time place and person. Moves all extremities.  Labs on Admission:  Basic Metabolic Panel:  Recent Labs Lab 06/09/15 1634 06/09/15 1747  NA 136  --   K 3.1*  --   CL 102  --   CO2 23  --   GLUCOSE 95  --   BUN 8  --   CREATININE 0.72  --   CALCIUM 10.3  --   MG  --  2.1   Liver Function Tests: No results for input(s): AST, ALT, ALKPHOS, BILITOT, PROT, ALBUMIN in the last 168 hours. No results for input(s): LIPASE, AMYLASE in the last 168 hours. No results for input(s): AMMONIA in the last 168 hours. CBC:  Recent Labs Lab 06/09/15 1410  WBC 5.6  HGB 14.9  HCT 43.5  MCV 79.4  PLT 340   Cardiac Enzymes: No results for input(s): CKTOTAL, CKMB, CKMBINDEX, TROPONINI in the last 168 hours.  BNP (last 3 results) No results for input(s): BNP in the last 8760 hours.  ProBNP (last 3 results) No results for input(s): PROBNP in the last 8760 hours.  CBG: No results for input(s): GLUCAP in the last 168 hours.  Radiological Exams on Admission: Dg Chest Portable 1 View  06/09/2015  CLINICAL DATA:  Cold, history of asthma, nodule discharge EXAM: PORTABLE CHEST 1 VIEW COMPARISON:  None. FINDINGS: Normal mediastinum and cardiac silhouette. Normal pulmonary vasculature. No evidence of effusion, infiltrate, or pneumothorax. No acute bony abnormality. IMPRESSION: No acute cardiopulmonary process.  Electronically Signed   By: Genevive Bi M.D.   On: 06/09/2015 15:26    EKG: Independently reviewed. A. fib with RVR.  Assessment/Plan Principal Problem:   Atrial fibrillation with RVR (HCC) Active Problems:   Essential hypertension   Acute bronchitis   1. A. fib with RVR - chads 2 vasc score is 3. At this time I have placed patient on heparin infusion. Last year patient was prescribed Apixaban which patient states caused some numbness of the lower extremity and also had financial issues due to which patient stopped taking the medications. I have requested case manager consult to help with medication costs. Patient at this time has converted to sinus rhythm and I have ordered by mouth Cardizem following which patient's IV Cardizem will be weaned off. Patient has had 2-D echo last day which was unremarkable. Check TSH. I have advised patient not take any decongestants. 2. Acute  bronchitis - patient has been placed on Pulmicort and Xopenex. Check influenza PCR. 3. Hypertension - continue home medications.  I have reviewed patient's old charts and labs. Personally reviewed patient's chest x-ray and EKG.   DVT Prophylaxis heparin.  Code Status: Full code.  Family Communication: Discussed with patient.  Disposition Plan: Admit to inpatient.    Keian Odriscoll N. [facility] Pager [phone].  If 7PM-7AM, please contact night-coverage [email or web address] Password [email or web address] 06/09/2015, 11:02 PM

## 2015-06-09 NOTE — ED Provider Notes (Signed)
CSN: [id number]     Arrival date & time 06/09/15  1331 History   First MD Initiated Contact with Patient 06/09/15 1418     Chief Complaint  Patient presents with  . Cough  . Shortness of Breath     (Consider location/radiation/quality/duration/timing/severity/associated sxs/prior Treatment) Patient is a 64 y.o. female presenting with cough.  Cough Cough characteristics:  Dry Severity:  Severe Onset quality:  Gradual Duration:  3 days Timing:  Constant Progression:  Unchanged Chronicity:  New Smoker: no   Context: upper respiratory infection   Relieved by:  Beta-agonist inhaler Ineffective treatments: alka seltzer cold. Associated symptoms: shortness of breath, sinus congestion and wheezing   Associated symptoms: no chest pain, no diaphoresis, no eye discharge, no fever, no headaches, no rash (had rash and was given hydroxizine and it has been improving) and no sore throat     Past Medical History  Diagnosis Date  . Asthma   . Hypertension   . GERD (gastroesophageal reflux disease)   . A-fib ([place])   . Migraine    Past Surgical History  Procedure Laterality Date  . Tubal ligation     Family History  Problem Relation Age of Onset  . Cancer Mother   . Cancer Father   . Heart attack Mother 38   Social History  Substance Use Topics  . Smoking status: Former Smoker    Types: Cigarettes    Quit date: 11/07/2011  . Smokeless tobacco: Never Used  . Alcohol Use: No   OB History    No data available     Review of Systems  Constitutional: Negative for fever and diaphoresis.  HENT: Negative for sore throat.   Eyes: Negative for discharge and visual disturbance.  Respiratory: Positive for cough, shortness of breath and wheezing.   Cardiovascular: Negative for chest pain.  Gastrointestinal: Negative for nausea, vomiting and abdominal pain.  Genitourinary: Negative for difficulty urinating.  Musculoskeletal: Negative for back pain and neck pain.  Skin: Negative for  rash (had rash and was given hydroxizine and it has been improving).  Neurological: Negative for syncope and headaches.      Allergies  Pantoprazole and Celecoxib  Home Medications   Prior to Admission medications   Medication Sig Start Date End Date Taking? Authorizing Provider  albuterol (PROVENTIL HFA;VENTOLIN HFA) 108 (90 BASE) MCG/ACT inhaler Inhale 2 puffs into the lungs every 4 (four) hours as needed for wheezing or shortness of breath. 12/06/12  Yes Montine Circle, PA-C  albuterol (PROVENTIL) (2.5 MG/3ML) 0.083% nebulizer solution Take 3 mLs (2.5 mg total) by nebulization every 6 (six) hours as needed for wheezing or shortness of breath. 07/07/14  Yes Quintella Reichert, MD  fluticasone (FLOVENT HFA) 110 MCG/ACT inhaler Inhale 1 puff into the lungs 2 (two) times daily. 06/07/14  Yes Cherene Altes, MD  hydrochlorothiazide (HYDRODIURIL) 25 MG tablet Take 25 mg by mouth daily. 06/11/14  Yes Historical Provider, MD  hydrocortisone cream 1 % Apply to affected area 3 times daily 08/14/14  Yes Al Corpus, PA-C  hydrOXYzine (ATARAX/VISTARIL) 25 MG tablet Take 1 tablet (25 mg total) by mouth every 6 (six) hours. 06/01/15  Yes [name], DO  loratadine (CLARITIN) 10 MG tablet Take 1 tablet (10 mg total) by mouth daily. 05/13/15  Yes Jeffrey Hedges, PA-C  salmeterol (SEREVENT DISKUS) 50 MCG/DOSE diskus inhaler Inhale 1 puff into the lungs 2 (two) times daily. 06/07/14  Yes Cherene Altes, MD  tiotropium (SPIRIVA HANDIHALER) 18 MCG inhalation capsule Place 1  capsule (18 mcg total) into inhaler and inhale daily. 06/22/13  Yes Lacretia Leigh, MD  triamcinolone (KENALOG) 0.025 % ointment Apply 1 application topically 2 (two) times daily. 05/18/15  Yes Hanna Patel-Mills, PA-C  sulfamethoxazole-trimethoprim (SEPTRA DS) 800-160 MG per tablet Take 1 tablet by mouth every 12 (twelve) hours. 10/28/14   Wandra Arthurs, MD   BP 129/82 mmHg  Pulse 73  Temp(Src) 97.4 F (36.3 C)  Resp 22  SpO2  96% Physical Exam  Constitutional: She is oriented to person, place, and time. She appears well-developed and well-nourished. No distress.  HENT:  Head: Normocephalic and atraumatic.  Eyes: Conjunctivae and EOM are normal.  Neck: Normal range of motion.  Cardiovascular: Normal heart sounds and intact distal pulses.  An irregularly irregular rhythm present. Tachycardia present.  Exam reveals no gallop and no friction rub.   No murmur heard. Pulmonary/Chest: Effort normal. No respiratory distress. She has wheezes (mild expiratory). She has no rales.  Abdominal: Soft. She exhibits no distension. There is no tenderness. There is no guarding.  Musculoskeletal: She exhibits no edema or tenderness.  Neurological: She is alert and oriented to person, place, and time.  Skin: Skin is warm and dry. No rash noted. She is not diaphoretic. No erythema.  Nursing note and vitals reviewed.   ED Course  Procedures (including critical care time) Labs Review Labs Reviewed  CBC - Abnormal; Notable for the following:    RBC 5.48 (*)    RDW 16.1 (*)    All other components within normal limits  BASIC METABOLIC PANEL - Abnormal; Notable for the following:    Potassium 3.1 (*)    All other components within normal limits  MAGNESIUM  I-STAT TROPOININ, ED    Imaging Review Dg Chest Portable 1 View  06/09/2015  CLINICAL DATA:  Cold, history of asthma, nodule discharge EXAM: PORTABLE CHEST 1 VIEW COMPARISON:  None. FINDINGS: Normal mediastinum and cardiac silhouette. Normal pulmonary vasculature. No evidence of effusion, infiltrate, or pneumothorax. No acute bony abnormality. IMPRESSION: No acute cardiopulmonary process. Electronically Signed   By: Suzy Bouchard M.D.   On: 06/09/2015 15:26   I have personally reviewed and evaluated these images and lab results as part of my medical decision-making.   EKG Interpretation   Date/Time:  Monday June 09 2015 13:54:47 EDT Ventricular Rate:  166 PR  Interval:    QRS Duration: 84 QT Interval:  248 QTC Calculation: 412 R Axis:   69 Text Interpretation:  Atrial fibrillation with rapid ventricular response  Nonspecific ST abnormality Abnormal ECG Confirmed by COOK  MD, BRIAN  [phone]) on 06/09/2015 5:21:45 PM      CRITICAL CARE:Atrial fibrillation with RVR Performed by: Alvino Chapel   Total critical care time: 33min  Critical care time was exclusive of separately billable procedures and treating other patients.  Critical care was necessary to treat or prevent imminent or life-threatening deterioration.  Critical care was time spent personally by me on the following activities: development of treatment plan with patient and/or surrogate as well as nursing, discussions with consultants, evaluation of patient's response to treatment, examination of patient, obtaining history from patient or surrogate, ordering and performing treatments and interventions, ordering and review of laboratory studies, ordering and review of radiographic studies, pulse oximetry and re-evaluation of patient's condition.  MDM   Final diagnoses:  None   64 year old female with a history of hypertension, asthma, atrial fibrillation presents with concern of 2 days of nasal congestion, cough, and shortness of  breath with heart palpitations beginning this morning and found to be in atrial fibrillation with RVR on arrival to the emergency department.  Patient has been admitted for A. fib with RVR in the past and was recommended Eliquis and diltiazem and take as an outpatient, however has not taken these medications. (pt reports episode of paralysis with eliquis)    On arrival to the emergency department patient was in atrial fibrillation with RVR with a rate in the 170s with normal blood pressures. Did not cardiovert as unclear if pt is reliable to timing of initiation of afib and is not on anticoagulation. She was given a bolus of 15 mg of diltiazem and  started on a drip which was increased to 15 mg, with improvement of her heart rate however continuing A. fib with RVR with a rate in the 120s. She was given additional bolus of diltiazem and rate decreased to the high 90s and low 100s with pt still on 15 and hemodynamically stable.   Chest x-ray was done which showed no evidence of pneumonia or pulmonary edema.  Patient without chest pain and have low suspicion for ACS. Clinical history is most consistent with a viral URI, with albuterol use possibly contributing to patient's A. fib with RVR.   Patient without significant respiratory distress in the emergency department, and given only mild wheezing on exam in presence of afib/RVR she was not given nebulizer treatments.  Patient to be admitted to hospitalist for continued care.    Gareth Morgan, MD 06/09/15 [phone]

## 2015-06-09 NOTE — ED Notes (Signed)
Sample hemolyzed. Repeat sample drawn.

## 2015-06-09 NOTE — ED Notes (Signed)
Pt here for cough and SOB. sts she has asthma and she feels like her heart is beating fast. sts stated new medication for allergic reaction recently.

## 2015-06-09 NOTE — Progress Notes (Signed)
ANTICOAGULATION CONSULT NOTE - Initial Consult  Pharmacy Consult for Heparin Indication: Afib  Allergies  Allergen Reactions  . Pantoprazole Nausea And Vomiting and Other (See Comments)    dizziness  . Celecoxib Rash    Patient Measurements: Height: 5\' 7"  (170.2 cm) Weight: 109 lb 3.2 oz (49.533 kg) IBW/kg (Calculated) : 61.6  Vital Signs: Temp: 98.4 F (36.9 C) (10/17 2123) Temp Source: Oral (10/17 2123) BP: 144/89 mmHg (10/17 2030) Pulse Rate: 80 (10/17 2030)  Labs:  Recent Labs  06/09/15 1410 06/09/15 1634  HGB 14.9  --   HCT 43.5  --   PLT 340  --   CREATININE  --  0.72    Estimated Creatinine Clearance: 54.1 mL/min (by C-G formula based on Cr of 0.72).   Medical History: Past Medical History  Diagnosis Date  . Asthma   . Hypertension   . GERD (gastroesophageal reflux disease)   . A-fib ([place])   . Migraine     Assessment: 64 yo F presents on 10/17 with cough and SOB. PMH includes Afib but not on any anticoag at home. Pharmacy consulted to start heparin for Afib as she was in RVR with rates in the 170s in the ED. CBC stable, no s/s of bleed.  Goal of Therapy:  Heparin level 0.3-0.7 units/ml Monitor platelets by anticoagulation protocol: Yes   Plan:  Give 2,500 heparin BOLUS Start heparin gtt at 750 units/hr Check 6 hr HL Monitor daily HL, CBC, s/s of bleed  Elenor Quinones, PharmD Clinical Pharmacist Pager [phone] 06/09/2015 9:57 PM

## 2015-06-10 DIAGNOSIS — Z8249 Family history of ischemic heart disease and other diseases of the circulatory system: Secondary | ICD-10-CM | POA: Diagnosis not present

## 2015-06-10 DIAGNOSIS — Z87891 Personal history of nicotine dependence: Secondary | ICD-10-CM | POA: Diagnosis not present

## 2015-06-10 DIAGNOSIS — I4891 Unspecified atrial fibrillation: Secondary | ICD-10-CM | POA: Diagnosis not present

## 2015-06-10 DIAGNOSIS — J209 Acute bronchitis, unspecified: Secondary | ICD-10-CM | POA: Diagnosis present

## 2015-06-10 DIAGNOSIS — J45909 Unspecified asthma, uncomplicated: Secondary | ICD-10-CM | POA: Diagnosis present

## 2015-06-10 DIAGNOSIS — Z599 Problem related to housing and economic circumstances, unspecified: Secondary | ICD-10-CM | POA: Diagnosis not present

## 2015-06-10 DIAGNOSIS — I480 Paroxysmal atrial fibrillation: Secondary | ICD-10-CM | POA: Diagnosis not present

## 2015-06-10 DIAGNOSIS — Z888 Allergy status to other drugs, medicaments and biological substances status: Secondary | ICD-10-CM | POA: Diagnosis not present

## 2015-06-10 DIAGNOSIS — E876 Hypokalemia: Secondary | ICD-10-CM | POA: Diagnosis present

## 2015-06-10 DIAGNOSIS — I10 Essential (primary) hypertension: Secondary | ICD-10-CM | POA: Diagnosis not present

## 2015-06-10 DIAGNOSIS — K219 Gastro-esophageal reflux disease without esophagitis: Secondary | ICD-10-CM | POA: Diagnosis present

## 2015-06-10 DIAGNOSIS — Z809 Family history of malignant neoplasm, unspecified: Secondary | ICD-10-CM | POA: Diagnosis not present

## 2015-06-10 LAB — CBC
HEMATOCRIT: 38.4 % (ref 36.0–46.0)
HEMOGLOBIN: 13.2 g/dL (ref 12.0–15.0)
MCH: 27.4 pg (ref 26.0–34.0)
MCHC: 34.4 g/dL (ref 30.0–36.0)
MCV: 79.7 fL (ref 78.0–100.0)
Platelets: 300 10*3/uL (ref 150–400)
RBC: 4.82 MIL/uL (ref 3.87–5.11)
RDW: 15.9 % — ABNORMAL HIGH (ref 11.5–15.5)
WBC: 5.2 10*3/uL (ref 4.0–10.5)

## 2015-06-10 LAB — INFLUENZA PANEL BY PCR (TYPE A & B)
H1N1 flu by pcr: NOT DETECTED
Influenza A By PCR: NEGATIVE
Influenza B By PCR: NEGATIVE

## 2015-06-10 LAB — BASIC METABOLIC PANEL
ANION GAP: 10 (ref 5–15)
BUN: 9 mg/dL (ref 6–20)
CHLORIDE: 103 mmol/L (ref 101–111)
CO2: 24 mmol/L (ref 22–32)
Calcium: 9.4 mg/dL (ref 8.9–10.3)
Creatinine, Ser: 0.79 mg/dL (ref 0.44–1.00)
GFR calc Af Amer: >60 mL/min (ref >60)
GFR calc non Af Amer: >60 mL/min (ref >60)
GLUCOSE: 109 mg/dL — AB (ref 65–99)
POTASSIUM: 3 mmol/L — AB (ref 3.5–5.1)
Sodium: 137 mmol/L (ref 135–145)

## 2015-06-10 LAB — HEPARIN LEVEL (UNFRACTIONATED): HEPARIN UNFRACTIONATED: 0.22 [IU]/mL — AB (ref 0.30–0.70)

## 2015-06-10 LAB — TROPONIN I

## 2015-06-10 LAB — GLUCOSE, CAPILLARY: Glucose-Capillary: 103 mg/dL — ABNORMAL HIGH (ref 65–99)

## 2015-06-10 LAB — T4, FREE: Free T4: 1.06 ng/dL (ref 0.61–1.12)

## 2015-06-10 LAB — TSH: TSH: 3.64 u[IU]/mL (ref 0.350–4.500)

## 2015-06-10 MED ORDER — SENNA 8.6 MG PO TABS
1.0000 | ORAL_TABLET | Freq: Once | ORAL | Status: AC
  Administered 2015-06-10: 8.6 mg via ORAL
  Filled 2015-06-10: qty 1

## 2015-06-10 MED ORDER — RIVAROXABAN 20 MG PO TABS: 20.0000 mg | ORAL_TABLET | Freq: Every day | ORAL | Status: DC

## 2015-06-10 MED ORDER — DILTIAZEM HCL ER COATED BEADS 180 MG PO CP24: 180.0000 mg | ORAL_CAPSULE | Freq: Every day | ORAL | Status: DC

## 2015-06-10 MED ORDER — RIVAROXABAN 20 MG PO TABS
20.0000 mg | ORAL_TABLET | Freq: Every day | ORAL | Status: DC
  Administered 2015-06-10: 20 mg via ORAL
  Filled 2015-06-10: qty 1

## 2015-06-10 MED ORDER — POTASSIUM CHLORIDE CRYS ER 20 MEQ PO TBCR
40.0000 meq | EXTENDED_RELEASE_TABLET | Freq: Every day | ORAL | Status: DC
  Administered 2015-06-10: 40 meq via ORAL
  Filled 2015-06-10: qty 2

## 2015-06-10 MED ORDER — DILTIAZEM HCL ER COATED BEADS 180 MG PO CP24
180.0000 mg | ORAL_CAPSULE | Freq: Every day | ORAL | Status: DC
  Administered 2015-06-10: 180 mg via ORAL
  Filled 2015-06-10: qty 1

## 2015-06-10 MED ORDER — RIVAROXABAN 15 MG PO TABS: 15.0000 mg | ORAL_TABLET | Freq: Every day | ORAL | Status: DC

## 2015-06-10 NOTE — Discharge Instructions (Signed)

## 2015-06-10 NOTE — Care Management Note (Addendum)
Case Management Note  Patient Details  Name: [name] MRN: [id number] Date of Birth: 03/23/1951  Subjective/Objective:   Pt admitted for A Fib RVR- plan for d/c home on medication Xarelto.PCP: Dr Kevan Ny- Pt with Medicare/Medicaid- cost to be $3.60 no more than $4.00 for Xarelto. Xarelto is a preferred medication for Medicaid. 30 day free card given to pt. Pt will need Rx to go along with the 30 day free no refills. PCP to write continuation Rx with refills.                  Action/Plan: CM did call [facility] on [address]- and medication Xarelto is  Available.  Pt did ask about Ensure and getting in bulk form in order for insurance to pay:  CM did look at [facility] Guidelines and  Per guidelines people that require supplemental nutrition, demonstrate documented compliance with an appropriate medical and nutritional plan of care, have a body mass index (BMI) under 22 as defined by the Centers for Disease Control, and a documented, unintentional weight loss of 5 percent or more within the previous 6 month period, up to 1,000 calories per day- Medicaid may pay for. CM did ask pt to ask PCP about Ensure- it is a paper claim. Many Pharmacies can not bill for it. CM did make pt aware may can go through [facility] or [facility].   No further needs from CM at this time.   Expected Discharge Date:  06/10/15               Expected Discharge Plan:  Home/Self Care  In-House Referral:  NA  Discharge planning Services  CM Consult  Post Acute Care Choice:  NA Choice offered to:  NA  DME Arranged:  N/A DME Agency:  NA  HH Arranged:  NA HH Agency:  NA  Status of Service:  Completed, signed off  Medicare Important Message Given:    Date Medicare IM Given:    Medicare IM give by:    Date Additional Medicare IM Given:    Additional Medicare Important Message give by:     If discussed at [place] of Stay Meetings, dates discussed:    Additional Comments:  Bethena Roys, RN 06/10/2015, 11:28 AM

## 2015-06-10 NOTE — Discharge Summary (Signed)
Physician Discharge Summary  [name]:[id number] DOB: 1951/06/02 DOA: 06/09/2015  PCP: Kevan Ny, MD  Admit date: 06/09/2015 Discharge date: 06/10/2015  Time spent: 25 minutes  Recommendations for Outpatient Follow-up:  1. Please obtain TSH, CBC, Chem-12 in about one week 2. Patient has been started on this admission on Cardizem 120 daily as well as Xarelto 20 daily and should continue these indefinitely 3. She states that she has potassium pills at home which she can continue   Discharge Diagnoses:  Principal Problem:   Atrial fibrillation with RVR [facility]) Active Problems:   Essential hypertension   Acute bronchitis   Discharge Condition: Fair  Diet recommendation: Heart healthy low-salt  Filed Weights   06/09/15 2123 06/10/15 0400  Weight: 49.533 kg (109 lb 3.2 oz) 49.5 kg (109 lb 2 oz)    History of present illness:  64 y/o ? Recent ED visits 04/2015, 05/2105 Allergies and burn Chr Afib CHad2Vasc2 score=3 H/o Asthmatic bronchitis Hyperkalemia Htn  Admitted to [facility] hosptial 10/17 with AFib + RVR  Also found to be hypokalemic  Heart rate was controlled initially Cardizem and she converted out of A. Fib She was transitioned from 60 3 times a day to 180 Cardizem CD It was felt like her paroxysmal A. fib may been secondary to use of phenol   Flu test TSH was 3.6  T4 1.06  T3     did state that she had difficulty obtaining medications and financial concerns Confirmed with case manager she could get Cardizem 180 CD as well as Xarelto 20 mg daily $3 and she was hemodynamically stable for discharge      Discharge Exam: Filed Vitals:   06/10/15 0743  BP:   Pulse:   Temp: 97.5 F (36.4 C)  Resp:     EOMI NCAT S1-S2 no murmur. No gallop-heart rate is rate controlled below 100  Clinically clear no added sound  Discharge Instructions   Discharge Instructions    Diet - low sodium heart healthy    Complete by:  As directed      Discharge  instructions    Complete by:  As directed   Please make sure that you get the medications Xarelto as well as Cardizem. These are both on the $3 list and they are going to be provided to by prescription. Please do not take anything over-the-counter other than Delsym for cough in the future Please follow-up with Dr. Marlou Sa and get lab work done as an outpatient in about one week     Increase activity slowly    Complete by:  As directed           Current Discharge Medication List    START taking these medications   Details  diltiazem (CARDIZEM CD) 180 MG 24 hr capsule Take 1 capsule (180 mg total) by mouth daily. Qty: 30 capsule, Refills: 0    rivaroxaban (XARELTO) 20 MG TABS tablet Take 1 tablet (20 mg total) by mouth daily with supper. Qty: 30 tablet, Refills: 0      CONTINUE these medications which have NOT CHANGED   Details  albuterol (PROVENTIL HFA;VENTOLIN HFA) 108 (90 BASE) MCG/ACT inhaler Inhale 2 puffs into the lungs every 4 (four) hours as needed for wheezing or shortness of breath. Qty: 1 Inhaler, Refills: 3    albuterol (PROVENTIL) (2.5 MG/3ML) 0.083% nebulizer solution Take 3 mLs (2.5 mg total) by nebulization every 6 (six) hours as needed for wheezing or shortness of breath. Qty: 75 mL, Refills:  12    fluticasone (FLOVENT HFA) 110 MCG/ACT inhaler Inhale 1 puff into the lungs 2 (two) times daily. Qty: 1 Inhaler, Refills: 0    hydrocortisone cream 1 % Apply to affected area 3 times daily Qty: 15 g, Refills: 0    hydrOXYzine (ATARAX/VISTARIL) 25 MG tablet Take 1 tablet (25 mg total) by mouth every 6 (six) hours. Qty: 12 tablet, Refills: 0    loratadine (CLARITIN) 10 MG tablet Take 1 tablet (10 mg total) by mouth daily. Qty: 30 tablet, Refills: 0    salmeterol (SEREVENT DISKUS) 50 MCG/DOSE diskus inhaler Inhale 1 puff into the lungs 2 (two) times daily. Qty: 1 Inhaler, Refills: 12    tiotropium (SPIRIVA HANDIHALER) 18 MCG inhalation capsule Place 1 capsule (18 mcg  total) into inhaler and inhale daily. Qty: 30 capsule, Refills: 0    triamcinolone (KENALOG) 0.025 % ointment Apply 1 application topically 2 (two) times daily. Qty: 30 g, Refills: 0      STOP taking these medications     hydrochlorothiazide (HYDRODIURIL) 25 MG tablet      sulfamethoxazole-trimethoprim (SEPTRA DS) 800-160 MG per tablet        Allergies  Allergen Reactions  . Pantoprazole Nausea And Vomiting and Other (See Comments)    dizziness  . Celecoxib Rash      The results of significant diagnostics from this hospitalization (including imaging, microbiology, ancillary and laboratory) are listed below for reference.    Significant Diagnostic Studies: Dg Chest Portable 1 View  06/09/2015  CLINICAL DATA:  Cold, history of asthma, nodule discharge EXAM: PORTABLE CHEST 1 VIEW COMPARISON:  None. FINDINGS: Normal mediastinum and cardiac silhouette. Normal pulmonary vasculature. No evidence of effusion, infiltrate, or pneumothorax. No acute bony abnormality. IMPRESSION: No acute cardiopulmonary process. Electronically Signed   By: Suzy Bouchard M.D.   On: 06/09/2015 15:26    Microbiology: Recent Results (from the past 240 hour(s))  MRSA PCR Screening     Status: None   Collection Time: 06/09/15  9:49 PM  Result Value Ref Range Status   MRSA by PCR NEGATIVE NEGATIVE Final    Comment:        The GeneXpert MRSA Assay (FDA approved for NASAL specimens only), is one component of a comprehensive MRSA colonization surveillance program. It is not intended to diagnose MRSA infection nor to guide or monitor treatment for MRSA infections.      Labs: Basic Metabolic Panel:  Recent Labs Lab 06/09/15 1634 06/09/15 1747 06/10/15 0519  NA 136  --  137  K 3.1*  --  3.0*  CL 102  --  103  CO2 23  --  24  GLUCOSE 95  --  109*  BUN 8  --  9  CREATININE 0.72  --  0.79  CALCIUM 10.3  --  9.4  MG  --  2.1  --    Liver Function Tests: No results for input(s): AST, ALT,  ALKPHOS, BILITOT, PROT, ALBUMIN in the last 168 hours. No results for input(s): LIPASE, AMYLASE in the last 168 hours. No results for input(s): AMMONIA in the last 168 hours. CBC:  Recent Labs Lab 06/09/15 1410 06/10/15 0519  WBC 5.6 5.2  HGB 14.9 13.2  HCT 43.5 38.4  MCV 79.4 79.7  PLT 340 300   Cardiac Enzymes:  Recent Labs Lab 06/09/15 2335 06/10/15 0519 06/10/15 1037  TROPONINI <0.03 <0.03 <0.03   BNP: BNP (last 3 results) No results for input(s): BNP in the last 8760 hours.  ProBNP (last 3 results) No results for input(s): PROBNP in the last 8760 hours.  CBG:  Recent Labs Lab 06/10/15 0742  GLUCAP 103*       Signed:  Nita Sells  [facility] 06/10/2015, 11:30 AM

## 2015-06-10 NOTE — Progress Notes (Signed)
ANTICOAGULATION CONSULT NOTE - Follow Up Consult  Pharmacy Consult for Heparin  Indication: atrial fibrillation  Allergies  Allergen Reactions  . Pantoprazole Nausea And Vomiting and Other (See Comments)    dizziness  . Celecoxib Rash    Patient Measurements: Height: 5\' 7"  (170.2 cm) Weight: 109 lb 2 oz (49.5 kg) IBW/kg (Calculated) : 61.6  Vital Signs: Temp: 98.2 F (36.8 C) (10/18 0400) Temp Source: Oral (10/18 0400) BP: 126/89 mmHg (10/18 0000) Pulse Rate: 84 (10/18 0000)  Labs:  Recent Labs  06/09/15 1410 06/09/15 1634 06/09/15 2335 06/10/15 0519  HGB 14.9  --   --  13.2  HCT 43.5  --   --  38.4  PLT 340  --   --  300  HEPARINUNFRC  --   --   --  0.22*  CREATININE  --  0.72  --   --   TROPONINI  --   --  <0.03  --     Estimated Creatinine Clearance: 54.1 mL/min (by C-G formula based on Cr of 0.72).  Assessment: Heparin for afib, first HL is sub-therapeutic, no issues per RN.   Goal of Therapy:  Heparin level 0.3-0.7 units/ml Monitor platelets by anticoagulation protocol: Yes   Plan:  -Increase heparin drip to 850 units/hr -1400 HL -Daily CBC/HL -Monitor for bleeding  Narda Bonds 06/10/2015,6:13 AM

## 2015-06-10 NOTE — Progress Notes (Signed)
Nebs were given, pt stated that she is ready for bed. Pt is stable at this time. o2 saturations presents at 97% RA. No complications or distress noted.

## 2015-06-10 NOTE — Progress Notes (Signed)
UR Completed Calandria Mullings Graves-Bigelow, RN,BSN [phone]  

## 2015-06-10 NOTE — Progress Notes (Signed)
ANTICOAGULATION CONSULT NOTE - Initial Consult  Pharmacy Consult for Heparin >> Xarelto Indication: atrial fibrillation  Allergies  Allergen Reactions  . Pantoprazole Nausea And Vomiting and Other (See Comments)    dizziness  . Celecoxib Rash    Patient Measurements: Height: 5\' 7"  (170.2 cm) Weight: 109 lb 2 oz (49.5 kg) IBW/kg (Calculated) : 61.6  Vital Signs: Temp: 97.5 F (36.4 C) (10/18 0743) Temp Source: Oral (10/18 0743) BP: 123/85 mmHg (10/18 0400) Pulse Rate: 77 (10/18 0400)  Labs:  Recent Labs  06/09/15 1410 06/09/15 1634 06/09/15 2335 06/10/15 0519  HGB 14.9  --   --  13.2  HCT 43.5  --   --  38.4  PLT 340  --   --  300  HEPARINUNFRC  --   --   --  0.22*  CREATININE  --  0.72  --  0.79  TROPONINI  --   --  <0.03 <0.03    Estimated Creatinine Clearance: 54.1 mL/min (by C-G formula based on Cr of 0.79).   Medical History: Past Medical History  Diagnosis Date  . Asthma   . Hypertension   . GERD (gastroesophageal reflux disease)   . A-fib ([place])   . Migraine     Medications:  Infusions:    Assessment: 64 yo F with hx PAF was only on ASA in the past due to financial and transportation issues. Discharging on Xarelto and copay card.  Goal of Therapy:  Therapeutic Anticoagulation Monitor platelets by anticoagulation protocol: Yes   Plan:  D/C heparin infusion and labs. Xarelto 20mg  PO daily. [place], Pharm.D., BCPS Clinical Pharmacist Pager [phone] 06/10/2015 9:45 AM

## 2015-06-11 ENCOUNTER — Inpatient Hospital Stay (HOSPITAL_COMMUNITY)
Admission: EM | Admit: 2015-06-11 | Discharge: 2015-06-12 | DRG: 309 | Disposition: A | Discharge status: Home / Self Care | Payer: Medicare Other | Attending: Internal Medicine | Admitting: Internal Medicine

## 2015-06-11 ENCOUNTER — Emergency Department (HOSPITAL_COMMUNITY): Payer: Medicare Other

## 2015-06-11 ENCOUNTER — Inpatient Hospital Stay (HOSPITAL_COMMUNITY): Payer: Medicare Other

## 2015-06-11 ENCOUNTER — Encounter (HOSPITAL_COMMUNITY): Payer: Self-pay | Admitting: Emergency Medicine

## 2015-06-11 DIAGNOSIS — J449 Chronic obstructive pulmonary disease, unspecified: Secondary | ICD-10-CM | POA: Diagnosis present

## 2015-06-11 DIAGNOSIS — E876 Hypokalemia: Secondary | ICD-10-CM | POA: Diagnosis not present

## 2015-06-11 DIAGNOSIS — Z79899 Other long term (current) drug therapy: Secondary | ICD-10-CM | POA: Diagnosis not present

## 2015-06-11 DIAGNOSIS — R0602 Shortness of breath: Secondary | ICD-10-CM | POA: Diagnosis not present

## 2015-06-11 DIAGNOSIS — I480 Paroxysmal atrial fibrillation: Secondary | ICD-10-CM | POA: Diagnosis present

## 2015-06-11 DIAGNOSIS — Z681 Body mass index (BMI) 19 or less, adult: Secondary | ICD-10-CM

## 2015-06-11 DIAGNOSIS — J45909 Unspecified asthma, uncomplicated: Secondary | ICD-10-CM | POA: Diagnosis not present

## 2015-06-11 DIAGNOSIS — Z8249 Family history of ischemic heart disease and other diseases of the circulatory system: Secondary | ICD-10-CM | POA: Diagnosis not present

## 2015-06-11 DIAGNOSIS — L738 Other specified follicular disorders: Secondary | ICD-10-CM | POA: Diagnosis present

## 2015-06-11 DIAGNOSIS — F1210 Cannabis abuse, uncomplicated: Secondary | ICD-10-CM | POA: Diagnosis present

## 2015-06-11 DIAGNOSIS — J4531 Mild persistent asthma with (acute) exacerbation: Secondary | ICD-10-CM

## 2015-06-11 DIAGNOSIS — Z7902 Long term (current) use of antithrombotics/antiplatelets: Secondary | ICD-10-CM

## 2015-06-11 DIAGNOSIS — R634 Abnormal weight loss: Secondary | ICD-10-CM | POA: Diagnosis present

## 2015-06-11 DIAGNOSIS — I4891 Unspecified atrial fibrillation: Secondary | ICD-10-CM

## 2015-06-11 DIAGNOSIS — Z87891 Personal history of nicotine dependence: Secondary | ICD-10-CM

## 2015-06-11 DIAGNOSIS — Z7951 Long term (current) use of inhaled steroids: Secondary | ICD-10-CM

## 2015-06-11 DIAGNOSIS — L739 Follicular disorder, unspecified: Secondary | ICD-10-CM | POA: Diagnosis present

## 2015-06-11 DIAGNOSIS — K219 Gastro-esophageal reflux disease without esophagitis: Secondary | ICD-10-CM | POA: Diagnosis present

## 2015-06-11 DIAGNOSIS — I10 Essential (primary) hypertension: Secondary | ICD-10-CM | POA: Diagnosis present

## 2015-06-11 DIAGNOSIS — J4520 Mild intermittent asthma, uncomplicated: Secondary | ICD-10-CM | POA: Insufficient documentation

## 2015-06-11 DIAGNOSIS — E43 Unspecified severe protein-calorie malnutrition: Secondary | ICD-10-CM | POA: Diagnosis present

## 2015-06-11 LAB — COMPREHENSIVE METABOLIC PANEL
ALT: 14 U/L (ref 14–54)
AST: 21 U/L (ref 15–41)
Albumin: 4 g/dL (ref 3.5–5.0)
Alkaline Phosphatase: 67 U/L (ref 38–126)
Anion gap: 13 (ref 5–15)
BUN: 14 mg/dL (ref 6–20)
CHLORIDE: 104 mmol/L (ref 101–111)
CO2: 23 mmol/L (ref 22–32)
CREATININE: 0.94 mg/dL (ref 0.44–1.00)
Calcium: 10.6 mg/dL — ABNORMAL HIGH (ref 8.9–10.3)
GFR calc Af Amer: >60 mL/min (ref >60)
GFR calc non Af Amer: >60 mL/min (ref >60)
GLUCOSE: 110 mg/dL — AB (ref 65–99)
POTASSIUM: 3.3 mmol/L — AB (ref 3.5–5.1)
SODIUM: 140 mmol/L (ref 135–145)
Total Bilirubin: 0.5 mg/dL (ref 0.3–1.2)
Total Protein: 7.7 g/dL (ref 6.5–8.1)

## 2015-06-11 LAB — CBC WITH DIFFERENTIAL/PLATELET
BASOS ABS: 0 10*3/uL (ref 0.0–0.1)
BASOS PCT: 1 %
EOS ABS: 0.2 10*3/uL (ref 0.0–0.7)
EOS PCT: 4 %
HCT: 39 % (ref 36.0–46.0)
Hemoglobin: 12.8 g/dL (ref 12.0–15.0)
LYMPHS PCT: 51 %
Lymphs Abs: 2.6 10*3/uL (ref 0.7–4.0)
MCH: 26.2 pg (ref 26.0–34.0)
MCHC: 32.8 g/dL (ref 30.0–36.0)
MCV: 79.9 fL (ref 78.0–100.0)
Monocytes Absolute: 0.5 10*3/uL (ref 0.1–1.0)
Monocytes Relative: 10 %
Neutro Abs: 1.7 10*3/uL (ref 1.7–7.7)
Neutrophils Relative %: 34 %
PLATELETS: 301 10*3/uL (ref 150–400)
RBC: 4.88 MIL/uL (ref 3.87–5.11)
RDW: 16 % — ABNORMAL HIGH (ref 11.5–15.5)
WBC: 5.1 10*3/uL (ref 4.0–10.5)

## 2015-06-11 LAB — MAGNESIUM: MAGNESIUM: 2 mg/dL (ref 1.7–2.4)

## 2015-06-11 LAB — RAPID URINE DRUG SCREEN, HOSP PERFORMED
AMPHETAMINES: NOT DETECTED
BARBITURATES: NOT DETECTED
Benzodiazepines: NOT DETECTED
Cocaine: NOT DETECTED
Opiates: NOT DETECTED
TETRAHYDROCANNABINOL: POSITIVE — AB

## 2015-06-11 LAB — T3, FREE: T3, Free: 3.5 pg/mL (ref 2.0–4.4)

## 2015-06-11 LAB — APTT: aPTT: 33 seconds (ref 24–37)

## 2015-06-11 LAB — HEPARIN LEVEL (UNFRACTIONATED): Heparin Unfractionated: >2.2 IU/mL — ABNORMAL HIGH (ref 0.30–0.70)

## 2015-06-11 LAB — BRAIN NATRIURETIC PEPTIDE: B NATRIURETIC PEPTIDE 5: 17.5 pg/mL (ref 0.0–100.0)

## 2015-06-11 LAB — PROTIME-INR
INR: 1.6 — ABNORMAL HIGH (ref 0.00–1.49)
PROTHROMBIN TIME: 19 s — AB (ref 11.6–15.2)

## 2015-06-11 LAB — TROPONIN I: Troponin I: <0.03 ng/mL (ref <0.031)

## 2015-06-11 MED ORDER — LEVALBUTEROL HCL 0.63 MG/3ML IN NEBU: 0.6300 mg | INHALATION_SOLUTION | Freq: Four times a day (QID) | RESPIRATORY_TRACT | Status: DC | PRN

## 2015-06-11 MED ORDER — LORATADINE 10 MG PO TABS
10.0000 mg | ORAL_TABLET | Freq: Every day | ORAL | Status: DC
  Administered 2015-06-11 – 2015-06-12 (×2): 10 mg via ORAL
  Filled 2015-06-11 (×2): qty 1

## 2015-06-11 MED ORDER — SALMETEROL XINAFOATE 50 MCG/DOSE IN AEPB
1.0000 | INHALATION_SPRAY | Freq: Two times a day (BID) | RESPIRATORY_TRACT | Status: DC
  Administered 2015-06-11: 1 via RESPIRATORY_TRACT
  Filled 2015-06-11: qty 0

## 2015-06-11 MED ORDER — DEXTROMETHORPHAN POLISTIREX ER 30 MG/5ML PO SUER
30.0000 mg | Freq: Two times a day (BID) | ORAL | Status: DC
  Administered 2015-06-11 (×2): 30 mg via ORAL
  Filled 2015-06-11 (×4): qty 5

## 2015-06-11 MED ORDER — BUDESONIDE 0.25 MG/2ML IN SUSP
0.2500 mg | Freq: Two times a day (BID) | RESPIRATORY_TRACT | Status: DC
  Administered 2015-06-11 – 2015-06-12 (×2): 0.25 mg via RESPIRATORY_TRACT
  Filled 2015-06-11 (×2): qty 2

## 2015-06-11 MED ORDER — ONDANSETRON HCL 4 MG/2ML IJ SOLN: 4.0000 mg | Freq: Four times a day (QID) | INTRAMUSCULAR | Status: DC | PRN

## 2015-06-11 MED ORDER — HYDROXYZINE HCL 25 MG PO TABS
25.0000 mg | ORAL_TABLET | Freq: Four times a day (QID) | ORAL | Status: DC
  Administered 2015-06-11: 25 mg via ORAL
  Filled 2015-06-11 (×2): qty 1

## 2015-06-11 MED ORDER — RIVAROXABAN 20 MG PO TABS
20.0000 mg | ORAL_TABLET | Freq: Every day | ORAL | Status: DC
  Administered 2015-06-11: 20 mg via ORAL
  Filled 2015-06-11 (×2): qty 1

## 2015-06-11 MED ORDER — TIOTROPIUM BROMIDE MONOHYDRATE 18 MCG IN CAPS
18.0000 ug | ORAL_CAPSULE | Freq: Every day | RESPIRATORY_TRACT | Status: DC
  Administered 2015-06-12: 18 ug via RESPIRATORY_TRACT
  Filled 2015-06-11: qty 5

## 2015-06-11 MED ORDER — POTASSIUM CHLORIDE CRYS ER 20 MEQ PO TBCR
40.0000 meq | EXTENDED_RELEASE_TABLET | Freq: Once | ORAL | Status: AC
  Administered 2015-06-11: 40 meq via ORAL
  Filled 2015-06-11: qty 2

## 2015-06-11 MED ORDER — SODIUM CHLORIDE 0.9 % IV BOLUS (SEPSIS): 500.0000 mL | Freq: Once | INTRAVENOUS | Status: DC

## 2015-06-11 MED ORDER — ALPRAZOLAM 0.25 MG PO TABS: 0.2500 mg | ORAL_TABLET | Freq: Two times a day (BID) | ORAL | Status: DC | PRN

## 2015-06-11 MED ORDER — DILTIAZEM HCL ER COATED BEADS 240 MG PO CP24: 240.0000 mg | ORAL_CAPSULE | Freq: Every day | ORAL | Status: DC

## 2015-06-11 MED ORDER — METHYLPREDNISOLONE SODIUM SUCC 125 MG IJ SOLR
125.0000 mg | Freq: Once | INTRAMUSCULAR | Status: AC
  Administered 2015-06-11: 125 mg via INTRAVENOUS
  Filled 2015-06-11: qty 2

## 2015-06-11 MED ORDER — HEPARIN (PORCINE) IN NACL 100-0.45 UNIT/ML-% IJ SOLN
850.0000 [IU]/h | INTRAMUSCULAR | Status: DC
  Filled 2015-06-11: qty 250

## 2015-06-11 MED ORDER — POTASSIUM CHLORIDE CRYS ER 20 MEQ PO TBCR
20.0000 meq | EXTENDED_RELEASE_TABLET | Freq: Every day | ORAL | Status: DC
  Administered 2015-06-12: 20 meq via ORAL
  Filled 2015-06-11: qty 1

## 2015-06-11 MED ORDER — DILTIAZEM HCL 100 MG IV SOLR
5.0000 mg/h | INTRAVENOUS | Status: DC
  Administered 2015-06-11: 5 mg/h via INTRAVENOUS
  Filled 2015-06-11: qty 100

## 2015-06-11 MED ORDER — ENSURE ENLIVE PO LIQD
237.0000 mL | Freq: Two times a day (BID) | ORAL | Status: DC
  Administered 2015-06-11 – 2015-06-12 (×2): 237 mL via ORAL

## 2015-06-11 MED ORDER — MAGNESIUM HYDROXIDE 400 MG/5ML PO SUSP
15.0000 mL | Freq: Every day | ORAL | Status: DC | PRN
  Administered 2015-06-11: 15 mL via ORAL
  Filled 2015-06-11 (×2): qty 30

## 2015-06-11 MED ORDER — DILTIAZEM LOAD VIA INFUSION
10.0000 mg | Freq: Once | INTRAVENOUS | Status: AC
  Administered 2015-06-11: 10 mg via INTRAVENOUS
  Filled 2015-06-11: qty 10

## 2015-06-11 MED ORDER — ACETAMINOPHEN 325 MG PO TABS
650.0000 mg | ORAL_TABLET | ORAL | Status: DC | PRN
  Administered 2015-06-11 – 2015-06-12 (×2): 650 mg via ORAL
  Filled 2015-06-11 (×2): qty 2

## 2015-06-11 MED ORDER — LEVALBUTEROL HCL 1.25 MG/0.5ML IN NEBU
1.2500 mg | INHALATION_SOLUTION | Freq: Once | RESPIRATORY_TRACT | Status: AC
  Administered 2015-06-11: 1.25 mg via RESPIRATORY_TRACT
  Filled 2015-06-11: qty 0.5

## 2015-06-11 MED ORDER — FLUTICASONE PROPIONATE HFA 110 MCG/ACT IN AERO
1.0000 | INHALATION_SPRAY | Freq: Two times a day (BID) | RESPIRATORY_TRACT | Status: DC
Start: 2015-06-11 — End: 2015-06-11

## 2015-06-11 MED ORDER — DILTIAZEM HCL ER COATED BEADS 180 MG PO CP24
180.0000 mg | ORAL_CAPSULE | Freq: Every day | ORAL | Status: DC
  Administered 2015-06-11: 180 mg via ORAL
  Filled 2015-06-11: qty 1

## 2015-06-11 MED ORDER — CEPHALEXIN 250 MG PO CAPS: 250.0000 mg | ORAL_CAPSULE | Freq: Three times a day (TID) | ORAL | Status: DC

## 2015-06-11 MED ORDER — SODIUM CHLORIDE 0.9 % IV SOLN: INTRAVENOUS | Status: DC

## 2015-06-11 NOTE — ED Notes (Signed)
Admitting PA at the bedside.

## 2015-06-11 NOTE — H&P (Signed)
[facility] History and Physical                                                                                    [name], is a 64 y.o. female  MRN: [id number]   DOB - 1950-12-25  Admit Date - 06/11/2015  Outpatient Primary MD for the patient is August Saucer, [name], MD  Referring MD: Blinda Leatherwood / ER  With History of -  Past Medical History  Diagnosis Date  . Asthma   . Hypertension   . GERD (gastroesophageal reflux disease)   . A-fib (HCC)   . Migraine       Past Surgical History  Procedure Laterality Date  . Tubal ligation      in for   Chief Complaint  Patient presents with  . Chest Pain  . Shortness of Breath     HPI This is a 64 year old female patient with past medical history of hypertension, chronic ongoing weight loss, daily marijuana use, asthma/COPD, reflux, and apparent recurrent folliculitis. She has a history of paroxysmal atrial fibrillation with RVR and was hospitalized in 2015 and at that time was started eliquis. She apparently had nonspecific neurological symptoms which she thought was related to the eliquis so this medication was discontinued. She was recently admitted on 10/17 and subsequently discharged on 10/18 for atrial fibrillation with RVR. She was started on daily Cardizem and Xarelto and was asymptomatic on date of discharge. Because she had received a dose of her Cardizem on date of discharge and she did not have enough money on date of discharge to obtain her new prescriptions she opted to wait until today to obtain her new medications. Unfortunately shortly after arriving home patient began noticing dyspnea on exertion but had no awareness of tachypalpitations. She also admitted to smoking marijuana shortly after arriving home as well. Her symptoms persisted and worsened by this morning so she presented to the ER for further evaluation. At this point she was also noticing some slight tightness in her chest. Is also having nonproductive coughing  spells.  Upon arrival to the ER she was afebrile, her heart rate ranged from 138 bpm to 167 bpm, she was in atrial fibrillation, her blood pressure was stable at 120/75, respirations were between 25 and 29 breaths per minute, her room air saturations were 94%. Her chest x-ray did not demonstrate any evidence of pneumonia or edema. She was started on Cardizem infusion at 5 mg per hour but her rate has been recalcitrant to this therapy so her infusion has been titrated up to 15 mg per hour as of 10:30 AM. Patient at this time is continuing to complain of itching in her scalp which is chronic, weight loss which apparently has worsened over the past 2 weeks associated with poor appetite and intermittent constipation. In the ER patient's potassium was low at 3.3, her calcium was slightly elevated 10.6, renal function was normal, glucose slightly elevated at 110, LFTs were normal, BNP was normal, troponin was normal, CBC was normal, urine drug screen was positive for THC, EKG demonstrated atrial fibrillation with ventricular response in the 150s   Review of Systems   In addition  to the HPI above,  No Fever-chills, myalgias or other constitutional symptoms No Headache, changes with Vision or hearing, new weakness, tingling, numbness in any extremity, No problems swallowing food or Liquids, indigestion/reflux No palpitations, orthopnea  No Abdominal pain, N/V; no melena or hematochezia, no dark tarry stools No dysuria, hematuria or flank pain No new skin rashes, lesions, masses or bruises, No new joints pains-aches No polyuria, polydypsia or polyphagia,  *A full 10 point Review of Systems was done, except as stated above, all other Review of Systems were negative.  Social History Social History  Substance Use Topics  . Smoking status: Former Smoker    Types: Cigarettes    Quit date: 11/07/2011  . Smokeless tobacco: Never Used  . Alcohol Use: No    Resides at: Private residence  Lives with:  Alone  Ambulatory status: Without assistive devices   Family History Family History  Problem Relation Age of Onset  . Cancer Mother   . Cancer Father   . Heart attack Colon cancer  Mother Sisters x 2 60     Prior to Admission medications   Medication Sig Start Date End Date Taking? Authorizing Provider  albuterol (PROVENTIL) (2.5 MG/3ML) 0.083% nebulizer solution Take 3 mLs (2.5 mg total) by nebulization every 6 (six) hours as needed for wheezing or shortness of breath. 07/07/14  Yes Tilden Fossa, MD  diltiazem (CARDIZEM CD) 180 MG 24 hr capsule Take 1 capsule (180 mg total) by mouth daily. 06/10/15  Yes Rhetta Mura, MD  fluticasone (FLOVENT HFA) 110 MCG/ACT inhaler Inhale 1 puff into the lungs 2 (two) times daily. 06/07/14  Yes Lonia Blood, MD  hydrochlorothiazide (HYDRODIURIL) 25 MG tablet Take 25 mg by mouth daily. 05/13/15  Yes Historical Provider, MD  hydrocortisone cream 1 % Apply to affected area 3 times daily 08/14/14  Yes Oswaldo Conroy, PA-C  hydrOXYzine (ATARAX/VISTARIL) 25 MG tablet Take 1 tablet (25 mg total) by mouth every 6 (six) hours. 06/01/15  Yes Melene Plan, DO  loratadine (CLARITIN) 10 MG tablet Take 1 tablet (10 mg total) by mouth daily. 05/13/15  Yes Jeffrey Hedges, PA-C  potassium chloride SA (K-DUR,KLOR-CON) 20 MEQ tablet Take 20 mEq by mouth daily. 05/01/15  Yes Historical Provider, MD  rivaroxaban (XARELTO) 20 MG TABS tablet Take 1 tablet (20 mg total) by mouth daily with supper. 06/10/15  Yes Rhetta Mura, MD  salmeterol (SEREVENT DISKUS) 50 MCG/DOSE diskus inhaler Inhale 1 puff into the lungs 2 (two) times daily. 06/07/14  Yes Lonia Blood, MD  tiotropium (SPIRIVA HANDIHALER) 18 MCG inhalation capsule Place 1 capsule (18 mcg total) into inhaler and inhale daily. 06/22/13  Yes Lorre Nick, MD  triamcinolone (KENALOG) 0.025 % ointment Apply 1 application topically 2 (two) times daily. 05/18/15  Yes Hanna Patel-Mills, PA-C  albuterol  (PROVENTIL HFA;VENTOLIN HFA) 108 (90 BASE) MCG/ACT inhaler Inhale 2 puffs into the lungs every 4 (four) hours as needed for wheezing or shortness of breath. Patient not taking: Reported on 06/11/2015 12/06/12   Roxy Horseman, PA-C    Allergies  Allergen Reactions  . Pantoprazole Nausea And Vomiting and Other (See Comments)    dizziness  . Celecoxib Rash    Physical Exam  Vitals  Blood pressure 138/85, pulse 105, temperature 97.7 F (36.5 C), temperature source Oral, resp. rate 27, SpO2 95 %.   General:  In no acute distress somewhat anxious, appears malnourished  Psych:  Normal affect, Denies Suicidal or Homicidal ideations, Awake Alert, Oriented X 3. Speech and  thought patterns are clear and appropriate, no apparent short term memory deficits  Neuro:   No focal neurological deficits, CN II through XII intact, Strength 5/5 all 4 extremities, Sensation intact all 4 extremities.  ENT:  Ears and Eyes appear Normal, Conjunctivae clear, PER. Dry oral mucosa without erythema or exudates.  Neck:  Supple, No lymphadenopathy appreciated  Breast: No overt masses lesions or drainage noted bilaterally  Respiratory:  Symmetrical chest wall movement, Good air movement bilaterally, CTAB. Room Air  Cardiac: Irregular with persistent tachycardia with ventricular rates between 125 bpm and 168 bpm, underlying rhythm is atrial fibrillation, No Murmurs, no LE edema noted, no JVD, No carotid bruits, peripheral pulses palpable at 2+  Abdomen:  Positive bowel sounds, Soft, Non tender, Non distended,  No masses appreciated, no obvious hepatosplenomegaly  Skin:  No Cyanosis, poor Skin Turgor, No Bruise. Patient has evidence of scattered folliculitis throughout the scalp as well as small maculopapular lesions that appear mature running down her spine and into her low back with similar areas in the low abdomen and suprapubic regions as well as around the groin  Extremities: Symmetrical without obvious  trauma or injury,  no effusions.  Data Review  CBC  Recent Labs Lab 06/09/15 1410 06/10/15 0519 06/11/15 0718  WBC 5.6 5.2 5.1  HGB 14.9 13.2 12.8  HCT 43.5 38.4 39.0  PLT 340 300 301  MCV 79.4 79.7 79.9  MCH 27.2 27.4 26.2  MCHC 34.3 34.4 32.8  RDW 16.1* 15.9* 16.0*  LYMPHSABS  --   --  2.6  MONOABS  --   --  0.5  EOSABS  --   --  0.2  BASOSABS  --   --  0.0    Chemistries   Recent Labs Lab 06/09/15 1634 06/09/15 1747 06/10/15 0519 06/11/15 0718  NA 136  --  137 140  K 3.1*  --  3.0* 3.3*  CL 102  --  103 104  CO2 23  --  24 23  GLUCOSE 95  --  109* 110*  BUN 8  --  9 14  CREATININE 0.72  --  0.79 0.94  CALCIUM 10.3  --  9.4 10.6*  MG  --  2.1  --   --   AST  --   --   --  21  ALT  --   --   --  14  ALKPHOS  --   --   --  67  BILITOT  --   --   --  0.5    estimated creatinine clearance is 46 mL/min (by C-G formula based on Cr of 0.94).   Recent Labs  06/09/15 2335  TSH 3.640  T3FREE 3.5    Coagulation profile  Recent Labs Lab 06/11/15 0718  INR 1.60*    No results for input(s): DDIMER in the last 72 hours.  Cardiac Enzymes  Recent Labs Lab 06/10/15 0519 06/10/15 1037 06/11/15 0718  TROPONINI <0.03 <0.03 <0.03    Invalid input(s): POCBNP  Urinalysis    Component Value Date/Time   COLORURINE YELLOW 10/28/2014 0734   APPEARANCEUR CLOUDY* 10/28/2014 0734   LABSPEC 1.018 10/28/2014 0734   PHURINE 7.5 10/28/2014 0734   GLUCOSEU NEGATIVE 10/28/2014 0734   HGBUR TRACE* 10/28/2014 0734   BILIRUBINUR NEGATIVE 10/28/2014 0734   KETONESUR NEGATIVE 10/28/2014 0734   PROTEINUR NEGATIVE 10/28/2014 0734   UROBILINOGEN 0.2 10/28/2014 0734   NITRITE NEGATIVE 10/28/2014 0734   LEUKOCYTESUR NEGATIVE 10/28/2014 0734    Imaging  results:   Dg Chest Port 1 View  06/11/2015  CLINICAL DATA:  Shortness of breath.  COPD. EXAM: PORTABLE CHEST 1 VIEW COMPARISON:  06/09/2015 FINDINGS: The heart size and pulmonary vascularity are normal. The lungs  are clear but hyperinflated consistent with emphysema. No osseous abnormality. IMPRESSION: Emphysema.  No acute abnormalities. Electronically Signed   By: Francene Boyers M.D.   On: 06/11/2015 08:06   Dg Chest Portable 1 View  06/09/2015  CLINICAL DATA:  Cold, history of asthma, nodule discharge EXAM: PORTABLE CHEST 1 VIEW COMPARISON:  None. FINDINGS: Normal mediastinum and cardiac silhouette. Normal pulmonary vasculature. No evidence of effusion, infiltrate, or pneumothorax. No acute bony abnormality. IMPRESSION: No acute cardiopulmonary process. Electronically Signed   By: Genevive Bi M.D.   On: 06/09/2015 15:26     EKG: (Independently reviewed) atrial fibrillation with ventricular rate 156 bpm, QTC 420 ms but this is likely related to current rate, no ischemic changes observed   Assessment & Plan  Principal Problem:   Atrial fibrillation with RVR (HCC) -Admit to stepdown -Continue Cardizem infusion; if rate control remains elusive consider adding beta blocker -TSH normal last admission -Check echocardiogram since was last done in 2015 -Hopeful can transition back to oral Cardizem soon -Pharmacy to dose Xarelto -Keep potassium greater than or equal to 4.0 -Apparently was on eliquis previously in 2015 but had nonspecific neurological symptoms so this medication was discontinued  Active Problems:   Essential hypertension -Current blood pressure well controlled -Last admission HCTZ was discontinued in favor of the newly started Cardizem    Acute hypokalemia -On daily potassium at home-unclear if this was continued after HCTZ was stopped -Current potassium less than 4 and given RVR Will replete potassium and try to keep greater than 4 -Check magnesium    Asthma/COPD -Has definite changes consistent with COPD on x-ray -Patient reports has never smoked cigarettes but has smoked marijuana daily for multiple years so suspect this is etiology of her COPD -Was initially wheezing upon  presentation and responded well to Xopenex nab currently not wheezing -Continue preadmission MDIs -Xopenex nebs when necessary -cough suppressant    ?? 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--currently lesions are not erythematous and without any drainage so no indication to tx with antibiotics    Marijuana abuse -Patient admits to daily use including use prior to onset of symptoms after discharge -Suspect this is etiology to patient's COPD changes on chest x-ray -Counseled regarding absolute cessation; patient's sisters were in room and patient gave me permission to discuss this diagnosis with them and they agreed that patient does have a problem with this substance -While acutely ill I provided when necessary Xanax but would not recommend continuing this at discharge since high risk for abusing this as well    GERD -Currently denies symptoms    WEIGHT LOSS, ABNORMAL -Patient has a chronic component over the past 6 years with an acute component of 20 pounds over the past 2 weeks with associated anorexia/poor appetite -Suspect ongoing daily marijuana abuse contributing to lack of appetite and subsequent weight loss -TSH was normal last admission -Patient denies overt  GI symptoms such as abdominal pain or dark or bloody stools although she does have issues with constipation; she has 2 sisters with colon cancer and has never had a colonoscopy-defer to primary care physician regarding workup of weight loss including scheduling screening colonoscopy -Patient also denies unusual  vaginal bleeding (postmenopausal since age 38) but has not been to a gynecologist to have pelvic exam or have mammogram scheduled in multiple years -Calcium slightly elevated and with poor oral intake and recurrent tachyarrhythmias suspect patient may have a degree of volume depletion so we'll give normal saline 500 mL 1 and give gentle IV fluid hydration during the remainder of the hospitalization -Regular nonrestricted diet    DVT Prophylaxis: Xarelto  Family Communication:   Sisters at bedside  Code Status:  Full code  Condition:  Stable  Discharge disposition: Anticipate back to home environment in the next 24-48 hours pending stabilization of heart rate and resolution of RVR  Time spent in minutes : 60      [name] L. ANP on 06/11/2015 at 11:09 AM  Between 7am to 7pm - Pager - [phone]  After 7pm go to [email or web address] - password [email or web address]  And look for the night coverage person covering me after hours  [facility]

## 2015-06-11 NOTE — Progress Notes (Signed)
ANTICOAGULATION CONSULT NOTE - Initial Consult  Pharmacy Consult for xarelto Indication: atrial fibrillation  Allergies  Allergen Reactions  . Pantoprazole Nausea And Vomiting and Other (See Comments)    dizziness  . Celecoxib Rash    Patient Measurements:    Vital Signs: Temp: 97.7 F (36.5 C) (10/19 0715) Temp Source: Oral (10/19 0715) BP: 138/85 mmHg (10/19 1031) Pulse Rate: 105 (10/19 1031)  Labs:  Recent Labs  06/09/15 1410 06/09/15 1634  06/10/15 0519 06/10/15 1037 06/11/15 0718  HGB 14.9  --   --  13.2  --  12.8  HCT 43.5  --   --  38.4  --  39.0  PLT 340  --   --  300  --  301  APTT  --   --   --   --   --  33  LABPROT  --   --   --   --   --  19.0*  INR  --   --   --   --   --  1.60*  HEPARINUNFRC  --   --   --  0.22*  --  >2.20*  CREATININE  --  0.72  --  0.79  --  0.94  TROPONINI  --   --   < > <0.03 <0.03 <0.03  < > = values in this interval not displayed.  Estimated Creatinine Clearance: 46 mL/min (by C-G formula based on Cr of 0.94).   Medical History: Past Medical History  Diagnosis Date  . Asthma   . Hypertension   . GERD (gastroesophageal reflux disease)   . A-fib ([place])   . Migraine     Medications:  Albuterol  Cardizem  Flovent  Claritin  Serevent  Spiriva   Xarelto 20 mg daily, last dose 10/18 1045  Assessment: 64 y.o. female with Afib for xarelto.  Started on Xarelto yesterday morning, dose given prior to discharge at 1045. Now pt back in ED and to continue xarelto.  CBC WNL.  Creat 0.94  Creat cl > 64ml/min.  Plan:  -xarelto 20 mq qday with a meal - start at lunch today and transition to supper tomorrow -pharmacy to sign off thanks Eudelia Bunch, Pharm.D. [phone] 06/11/2015 10:59 AM

## 2015-06-11 NOTE — Progress Notes (Signed)
Echocardiogram 2D Echocardiogram has been performed.  [name] 06/11/2015, 4:46 PM

## 2015-06-11 NOTE — ED Provider Notes (Signed)
CSN: [id number]     Arrival date & time 06/11/15  0701 History   First MD Initiated Contact with Patient 06/11/15 [phone]     Chief Complaint  Patient presents with  . Chest Pain  . Shortness of Breath     (Consider location/radiation/quality/duration/timing/severity/associated sxs/prior Treatment) HPI Comments: Presents to the emergency department for evaluation of shortness of breath. Patient was admitted to the hospital 2 days ago for acute asthma exacerbation and atrial fibrillation with rapid ventricular response. She was discharged on Cardizem and Xarelto, has not filled the prescriptions yet. This morning she had increased shortness of breath and cough, presents to the ER for further evaluation. Patient reports slight tightness in her chest.  Patient is a 64 y.o. female presenting with chest pain and shortness of breath.  Chest Pain Associated symptoms: cough and shortness of breath   Shortness of Breath Associated symptoms: chest pain and cough     Past Medical History  Diagnosis Date  . Asthma   . Hypertension   . GERD (gastroesophageal reflux disease)   . A-fib ([place])   . Migraine    Past Surgical History  Procedure Laterality Date  . Tubal ligation     Family History  Problem Relation Age of Onset  . Cancer Mother   . Cancer Father   . Heart attack Mother 36   Social History  Substance Use Topics  . Smoking status: Former Smoker    Types: Cigarettes    Quit date: 11/07/2011  . Smokeless tobacco: Never Used  . Alcohol Use: No   OB History    No data available     Review of Systems  Respiratory: Positive for cough and shortness of breath.   Cardiovascular: Positive for chest pain.  All other systems reviewed and are negative.     Allergies  Pantoprazole and Celecoxib  Home Medications   Prior to Admission medications   Medication Sig Start Date End Date Taking? Authorizing Provider  albuterol (PROVENTIL) (2.5 MG/3ML) 0.083% nebulizer solution  Take 3 mLs (2.5 mg total) by nebulization every 6 (six) hours as needed for wheezing or shortness of breath. 07/07/14  Yes Quintella Reichert, MD  diltiazem (CARDIZEM CD) 180 MG 24 hr capsule Take 1 capsule (180 mg total) by mouth daily. 06/10/15  Yes Nita Sells, MD  fluticasone (FLOVENT HFA) 110 MCG/ACT inhaler Inhale 1 puff into the lungs 2 (two) times daily. 06/07/14  Yes Cherene Altes, MD  hydrochlorothiazide (HYDRODIURIL) 25 MG tablet Take 25 mg by mouth daily. 05/13/15  Yes Historical Provider, MD  hydrocortisone cream 1 % Apply to affected area 3 times daily 08/14/14  Yes Al Corpus, PA-C  hydrOXYzine (ATARAX/VISTARIL) 25 MG tablet Take 1 tablet (25 mg total) by mouth every 6 (six) hours. 06/01/15  Yes [name], DO  loratadine (CLARITIN) 10 MG tablet Take 1 tablet (10 mg total) by mouth daily. 05/13/15  Yes Jeffrey Hedges, PA-C  potassium chloride SA (K-DUR,KLOR-CON) 20 MEQ tablet Take 20 mEq by mouth daily. 05/01/15  Yes Historical Provider, MD  rivaroxaban (XARELTO) 20 MG TABS tablet Take 1 tablet (20 mg total) by mouth daily with supper. 06/10/15  Yes Nita Sells, MD  salmeterol (SEREVENT DISKUS) 50 MCG/DOSE diskus inhaler Inhale 1 puff into the lungs 2 (two) times daily. 06/07/14  Yes Cherene Altes, MD  tiotropium (SPIRIVA HANDIHALER) 18 MCG inhalation capsule Place 1 capsule (18 mcg total) into inhaler and inhale daily. 06/22/13  Yes Lacretia Leigh, MD  triamcinolone (KENALOG) 0.025 %  ointment Apply 1 application topically 2 (two) times daily. 05/18/15  Yes Hanna Patel-Mills, PA-C  albuterol (PROVENTIL HFA;VENTOLIN HFA) 108 (90 BASE) MCG/ACT inhaler Inhale 2 puffs into the lungs every 4 (four) hours as needed for wheezing or shortness of breath. Patient not taking: Reported on 06/11/2015 12/06/12   Montine Circle, PA-C   BP 117/71 mmHg  Pulse 79  Temp(Src) 97.7 F (36.5 C) (Oral)  Resp 19  SpO2 96% Physical Exam  Constitutional: She is oriented to person, place,  and time. She appears well-developed and well-nourished. No distress.  HENT:  Head: Normocephalic and atraumatic.  Right Ear: Hearing normal.  Left Ear: Hearing normal.  Nose: Nose normal.  Mouth/Throat: Oropharynx is clear and moist and mucous membranes are normal.  Eyes: Conjunctivae and EOM are normal. Pupils are equal, round, and reactive to light.  Neck: Normal range of motion. Neck supple.  Cardiovascular: S1 normal and S2 normal.  An irregularly irregular rhythm present. Tachycardia present.  Exam reveals no gallop and no friction rub.   No murmur heard. Pulmonary/Chest: Effort normal. No respiratory distress. She has decreased breath sounds. She has wheezes. She exhibits no tenderness.  Abdominal: Soft. Normal appearance and bowel sounds are normal. There is no hepatosplenomegaly. There is no tenderness. There is no rebound, no guarding, no tenderness at McBurney's point and negative Murphy's sign. No hernia.  Musculoskeletal: Normal range of motion.  Neurological: She is alert and oriented to person, place, and time. She has normal strength. No cranial nerve deficit or sensory deficit. Coordination normal. GCS eye subscore is 4. GCS verbal subscore is 5. GCS motor subscore is 6.  Skin: Skin is warm, dry and intact. No rash noted. No cyanosis.  Psychiatric: She has a normal mood and affect. Her speech is normal and behavior is normal. Thought content normal.  Nursing note and vitals reviewed.   ED Course  Procedures (including critical care time) Labs Review Labs Reviewed  CBC WITH DIFFERENTIAL/PLATELET - Abnormal; Notable for the following:    RDW 16.0 (*)    All other components within normal limits  COMPREHENSIVE METABOLIC PANEL - Abnormal; Notable for the following:    Potassium 3.3 (*)    Glucose, Bld 110 (*)    Calcium 10.6 (*)    All other components within normal limits  URINE RAPID DRUG SCREEN, HOSP PERFORMED - Abnormal; Notable for the following:     Tetrahydrocannabinol POSITIVE (*)    All other components within normal limits  PROTIME-INR - Abnormal; Notable for the following:    Prothrombin Time 19.0 (*)    INR 1.60 (*)    All other components within normal limits  HEPARIN LEVEL (UNFRACTIONATED) - Abnormal; Notable for the following:    Heparin Unfractionated >2.20 (*)    All other components within normal limits  TROPONIN I  BRAIN NATRIURETIC PEPTIDE  APTT  APTT  URINE RAPID DRUG SCREEN, HOSP PERFORMED    Imaging Review Dg Chest Port 1 View  06/11/2015  CLINICAL DATA:  Shortness of breath.  COPD. EXAM: PORTABLE CHEST 1 VIEW COMPARISON:  06/09/2015 FINDINGS: The heart size and pulmonary vascularity are normal. The lungs are clear but hyperinflated consistent with emphysema. No osseous abnormality. IMPRESSION: Emphysema.  No acute abnormalities. Electronically Signed   By: Lorriane Shire M.D.   On: 06/11/2015 08:06   Dg Chest Portable 1 View  06/09/2015  CLINICAL DATA:  Cold, history of asthma, nodule discharge EXAM: PORTABLE CHEST 1 VIEW COMPARISON:  None. FINDINGS: Normal mediastinum and  cardiac silhouette. Normal pulmonary vasculature. No evidence of effusion, infiltrate, or pneumothorax. No acute bony abnormality. IMPRESSION: No acute cardiopulmonary process. Electronically Signed   By: Suzy Bouchard M.D.   On: 06/09/2015 15:26   I have personally reviewed and evaluated these images and lab results as part of my medical decision-making.   EKG Interpretation   Date/Time:  Wednesday June 11 2015 07:10:22 EDT Ventricular Rate:  156 PR Interval:    QRS Duration: 91 QT Interval:  323 QTC Calculation: 520 R Axis:   87 Text Interpretation:  Atrial fibrillation with rapid V-rate Ventricular  premature complex Borderline right axis deviation ST depression, probably  rate related Confirmed by Karmina Zufall  MD, Gresia Isidoro ([id number]) on 06/11/2015  7:35:09 AM      MDM   Final diagnoses:  Atrial fibrillation with RVR ([facility])   Asthma, mild persistent, with acute exacerbation   Presents for evaluation of SOB. Was admitted two days ago for AFIB with RVR and asthma exacerbation. Reports worsening SOB with cough this AM. Presented in AFIB with RVR, treated with cardizem. Bronchospasm treated with xopenex. Will admit for further treatment.  CRITICAL CARE Performed by: Orpah Greek   Total critical care time: 49min  Critical care time was exclusive of separately billable procedures and treating other patients.  Critical care was necessary to treat or prevent imminent or life-threatening deterioration.  Critical care was time spent personally by me on the following activities: development of treatment plan with patient and/or surrogate as well as nursing, discussions with consultants, evaluation of patient's response to treatment, examination of patient, obtaining history from patient or surrogate, ordering and performing treatments and interventions, ordering and review of laboratory studies, ordering and review of radiographic studies, pulse oximetry and re-evaluation of patient's condition.     Orpah Greek, MD 06/11/15 1026

## 2015-06-11 NOTE — Progress Notes (Signed)
ANTICOAGULATION CONSULT NOTE - Initial Consult  Pharmacy Consult for Heparin Indication: atrial fibrillation  Allergies  Allergen Reactions  . Pantoprazole Nausea And Vomiting and Other (See Comments)    dizziness  . Celecoxib Rash    Patient Measurements:    Vital Signs: Temp: 97.7 F (36.5 C) (10/19 0715) Temp Source: Oral (10/19 0715) BP: 126/82 mmHg (10/19 0715) Pulse Rate: 73 (10/19 0715)  Labs:  Recent Labs  06/09/15 1410 06/09/15 1634 06/09/15 2335 06/10/15 0519 06/10/15 1037 06/11/15 0718  HGB 14.9  --   --  13.2  --  12.8  HCT 43.5  --   --  38.4  --  39.0  PLT 340  --   --  300  --  301  HEPARINUNFRC  --   --   --  0.22*  --   --   CREATININE  --  0.72  --  0.79  --   --   TROPONINI  --   --  <0.03 <0.03 <0.03  --     Estimated Creatinine Clearance: 54.1 mL/min (by C-G formula based on Cr of 0.79).   Medical History: Past Medical History  Diagnosis Date  . Asthma   . Hypertension   . GERD (gastroesophageal reflux disease)   . A-fib ([place])   . Migraine     Medications:  Albuterol  Cardizem  Flovent  Claritin  Serevent  Spiriva   Xarelto 20 mg daily, last dose 10/18 1045  Assessment: 64 y.o. female with Afib for heparin.  Started on Xarelto yesterday morning, dose given prior to discharge at 1045  Goal of Therapy:  APTT 66-102 while Xarelto interferring with anti-Xa level Heparin level 0.3-0.7 units/ml Monitor platelets by anticoagulation protocol: Yes   Plan:  Start heparin 850 units/hr at 1100 Check aPTT in 8 hours.   Solly Derasmo, [place] 06/11/2015,7:37 AM

## 2015-06-11 NOTE — ED Notes (Signed)
Per pt, seen yesterday for chest pain and shortness of breath. This am pt is complaining of centralized chest pain and shortness of breath.

## 2015-06-11 NOTE — ED Notes (Signed)
Advised by pharmacy to hold heparin drip until 11:00 due to the patient taking xarelto yesterday (10/18) at 10:45 am.

## 2015-06-11 NOTE — ED Notes (Signed)
Took patient to the restroom.  Heart rate climbed again.

## 2015-06-12 DIAGNOSIS — F1210 Cannabis abuse, uncomplicated: Secondary | ICD-10-CM

## 2015-06-12 DIAGNOSIS — R0602 Shortness of breath: Secondary | ICD-10-CM | POA: Diagnosis not present

## 2015-06-12 DIAGNOSIS — R634 Abnormal weight loss: Secondary | ICD-10-CM

## 2015-06-12 DIAGNOSIS — L739 Follicular disorder, unspecified: Secondary | ICD-10-CM | POA: Diagnosis not present

## 2015-06-12 DIAGNOSIS — J4530 Mild persistent asthma, uncomplicated: Secondary | ICD-10-CM | POA: Diagnosis not present

## 2015-06-12 DIAGNOSIS — I10 Essential (primary) hypertension: Secondary | ICD-10-CM

## 2015-06-12 DIAGNOSIS — I480 Paroxysmal atrial fibrillation: Secondary | ICD-10-CM | POA: Diagnosis not present

## 2015-06-12 DIAGNOSIS — I4891 Unspecified atrial fibrillation: Secondary | ICD-10-CM

## 2015-06-12 DIAGNOSIS — E43 Unspecified severe protein-calorie malnutrition: Secondary | ICD-10-CM

## 2015-06-12 LAB — CBC
HCT: 35.6 % — ABNORMAL LOW (ref 36.0–46.0)
Hemoglobin: 12.1 g/dL (ref 12.0–15.0)
MCH: 27.1 pg (ref 26.0–34.0)
MCHC: 34 g/dL (ref 30.0–36.0)
MCV: 79.8 fL (ref 78.0–100.0)
Platelets: 305 10*3/uL (ref 150–400)
RBC: 4.46 MIL/uL (ref 3.87–5.11)
RDW: 16 % — AB (ref 11.5–15.5)
WBC: 11.8 10*3/uL — ABNORMAL HIGH (ref 4.0–10.5)

## 2015-06-12 LAB — BASIC METABOLIC PANEL
Anion gap: 9 (ref 5–15)
BUN: 21 mg/dL — AB (ref 6–20)
CO2: 25 mmol/L (ref 22–32)
Calcium: 10.5 mg/dL — ABNORMAL HIGH (ref 8.9–10.3)
Chloride: 105 mmol/L (ref 101–111)
Creatinine, Ser: 0.81 mg/dL (ref 0.44–1.00)
GFR calc Af Amer: >60 mL/min (ref >60)
GFR calc non Af Amer: >60 mL/min (ref >60)
GLUCOSE: 121 mg/dL — AB (ref 65–99)
POTASSIUM: 4.5 mmol/L (ref 3.5–5.1)
Sodium: 139 mmol/L (ref 135–145)

## 2015-06-12 MED ORDER — ENSURE ENLIVE PO LIQD: 1.0000 | Freq: Two times a day (BID) | ORAL | Status: DC

## 2015-06-12 MED ORDER — DILTIAZEM HCL ER COATED BEADS 180 MG PO CP24
180.0000 mg | ORAL_CAPSULE | Freq: Every day | ORAL | Status: DC
  Administered 2015-06-12: 180 mg via ORAL
  Filled 2015-06-12 (×2): qty 1

## 2015-06-12 MED ORDER — PREDNISONE 20 MG PO TABS: 20.0000 mg | ORAL_TABLET | Freq: Every day | ORAL | Status: DC

## 2015-06-12 NOTE — Progress Notes (Signed)
Initial Nutrition Assessment  DOCUMENTATION CODES:   Severe malnutrition in context of chronic illness, Underweight  INTERVENTION:    Ensure Enlive PO BID, each supplement provides 350 kcal and 20 grams of protein  NUTRITION DIAGNOSIS:   Malnutrition related to chronic illness as evidenced by severe depletion of muscle mass, percent weight loss (11% weight loss within the past month).  GOAL:   Patient will meet greater than or equal to 90% of their needs  MONITOR:   PO intake, Supplement acceptance, Weight trends, Labs  REASON FOR ASSESSMENT:   Malnutrition Screening Tool    ASSESSMENT:   64 year old female who was just discharged from the hospital after being treated for A. fib with RVR. She returns mainly because she had a bout of coughing, felt chest tightness and short of breath. She used her inhaler twice at home and received a breathing treatment in the ER. She was noted to be back in A. fib with RVR.  Patient reports that she has been eating very poorly for the past 2 weeks due to acid reflux causing poor appetite. Nutrition-Focused physical exam completed. Findings are no fat depletion, mild-moderate and severe muscle depletion, and no edema. She likes Ensure and would like to receive them at home. RN Case Manager has outlined the process for obtaining Ensure in her notes and this has been discussed with the patient.  *Patient with severe PCM in the context of chronic illness given severe depletion of muscle mass and unintentional weight loss of 11% over the past month. Patient is also underweight with BMI=17.4.  Diet Order:  Diet Heart Room service appropriate?: Yes; Fluid consistency:: Thin  Skin:  Reviewed, no issues  Last BM:  10/19  Height:   Ht Readings from Last 1 Encounters:  06/11/15 5\' 7"  (1.702 m)    Weight:   Wt Readings from Last 1 Encounters:  06/12/15 111 lb 3.2 oz (50.44 kg)    Ideal Body Weight:  61.4 kg  BMI:  Body mass index is 17.41  kg/(m^2).  Estimated Nutritional Needs:   Kcal:  1500-1700  Protein:  80-90 gm  Fluid:  1.5-1.7 L  EDUCATION NEEDS:   Education needs addressed  Molli Barrows, [place], [place], [place] Pager [phone] After Hours Pager [phone]

## 2015-06-12 NOTE — Discharge Summary (Signed)
Physician Discharge Summary  [name] WFU:[id number] DOB: 02-Jul-1951 DOA: 06/11/2015  PCP: Kevan Ny, MD  Admit date: 06/11/2015 Discharge date: 06/12/2015  Time spent: > 30 minutes  Recommendations for Outpatient Follow-up:  1. Follow up with Dr. Marlou Sa in 1-2 weeks   Discharge Diagnoses:  Principal Problem:   Atrial fibrillation with RVR ([place]) Active Problems:   Essential hypertension   Asthma   GERD   WEIGHT LOSS, ABNORMAL   Folliculitis   Acute hypokalemia   Marijuana abuse   Asthma, mild persistent   Severe malnutrition ([place])  Discharge Condition: stable  Diet recommendation: regular  Filed Weights   06/11/15 1159 06/12/15 0509  Weight: 49.6 kg (109 lb 5.6 oz) 50.44 kg (111 lb 3.2 oz)   History of present illness:    Hospital Course:  Afib with RVR - patient with history of the same, she was briefly started on Cardizem infusion with restoration of normal sinus rhythm, she was started on her home Cardizem and has remained in sinus rhythm. Today patient asking to go home as she is asymptomatic. She was unable to obtain her home medications due to financial constraints and has not taken her Cardizem at home. CM consulted again to assist with medication management. She is to resume her Xarelto on dsicharge. She underwent a 2D echo which showed normal EF and grade 1 diastolic dysfunction Severe malnutrition in context of chronic illness, UnderweightPatient with severe PCM in the context of chronic illness given severe depletion of muscle mass and unintentional weight loss of 11% over the past month. Patient is also underweight with BMI=17.4. Asthma/COPD - no wheezing or evidence of exacerbation, resume home medications Rash / allergic reaction - on scalp and back, no evidence of active infection, ?related to irritation from hair dye. Short prednisone taper Marijuana abuse - counseled for cessation  Procedures:  2D echo Study Conclusions - Left ventricle: The  cavity size was normal. Systolic function wasnormal. The estimated ejection fraction was in the range of 60%to 65%. Wall motion was normal; there were no regional wallmotion abnormalities. Doppler parameters are consistent withabnormal left ventricular relaxation (grade 1 diastolicdysfunction). - Tricuspid valve: There was mild-moderate regurgitation directedcentrally.   Consultations:  None   Discharge Exam: Filed Vitals:   06/11/15 1955 06/11/15 2005 06/12/15 0509 06/12/15 0740  BP:  122/83 123/74   Pulse: 82 86 98 95  Temp:  98.2 F (36.8 C) 98.7 F (37.1 C)   TempSrc:  Oral Oral   Resp: 18 18 18 18   Height:      Weight:   50.44 kg (111 lb 3.2 oz)   SpO2:  96% 100% 99%   General: NAD Cardiovascular: RRR Respiratory: CTA biL  Discharge Instructions    Medication List    TAKE these medications        albuterol 108 (90 BASE) MCG/ACT inhaler  Commonly known as:  PROVENTIL HFA;VENTOLIN HFA  Inhale 2 puffs into the lungs every 4 (four) hours as needed for wheezing or shortness of breath.     albuterol (2.5 MG/3ML) 0.083% nebulizer solution  Commonly known as:  PROVENTIL  Take 3 mLs (2.5 mg total) by nebulization every 6 (six) hours as needed for wheezing or shortness of breath.     diltiazem 180 MG 24 hr capsule  Commonly known as:  CARDIZEM CD  Take 1 capsule (180 mg total) by mouth daily.     feeding supplement (ENSURE ENLIVE) Liqd  Take 237 mLs by mouth 2 (two) times  daily between meals.     fluticasone 110 MCG/ACT inhaler  Commonly known as:  FLOVENT HFA  Inhale 1 puff into the lungs 2 (two) times daily.     hydrochlorothiazide 25 MG tablet  Commonly known as:  HYDRODIURIL  Take 25 mg by mouth daily.     hydrocortisone cream 1 %  Apply to affected area 3 times daily     hydrOXYzine 25 MG tablet  Commonly known as:  ATARAX/VISTARIL  Take 1 tablet (25 mg total) by mouth every 6 (six) hours.     loratadine 10 MG tablet  Commonly known as:  CLARITIN    Take 1 tablet (10 mg total) by mouth daily.     potassium chloride SA 20 MEQ tablet  Commonly known as:  K-DUR,KLOR-CON  Take 20 mEq by mouth daily.     predniSONE 20 MG tablet  Commonly known as:  DELTASONE  Take 1 tablet (20 mg total) by mouth daily with breakfast. 40 mg daily for 3 days then 20 mg daily for 3 days     rivaroxaban 20 MG Tabs tablet  Commonly known as:  XARELTO  Take 1 tablet (20 mg total) by mouth daily with supper.     salmeterol 50 MCG/DOSE diskus inhaler  Commonly known as:  SEREVENT DISKUS  Inhale 1 puff into the lungs 2 (two) times daily.     tiotropium 18 MCG inhalation capsule  Commonly known as:  SPIRIVA HANDIHALER  Place 1 capsule (18 mcg total) into inhaler and inhale daily.     triamcinolone 0.025 % ointment  Commonly known as:  KENALOG  Apply 1 application topically 2 (two) times daily.           Follow-up Information    Follow up with Marlou Sa, [name], MD. Schedule an appointment as soon as possible for a visit in 1 week.   Specialty:  Internal Medicine   Contact information:   [address] [place] [address] [phone]       The results of significant diagnostics from this hospitalization (including imaging, microbiology, ancillary and laboratory) are listed below for reference.    Significant Diagnostic Studies: Dg Chest Port 1 View  06/11/2015  CLINICAL DATA:  Shortness of breath.  COPD. EXAM: PORTABLE CHEST 1 VIEW COMPARISON:  06/09/2015 FINDINGS: The heart size and pulmonary vascularity are normal. The lungs are clear but hyperinflated consistent with emphysema. No osseous abnormality. IMPRESSION: Emphysema.  No acute abnormalities. Electronically Signed   By: Lorriane Shire M.D.   On: 06/11/2015 08:06   Dg Chest Portable 1 View  06/09/2015  CLINICAL DATA:  Cold, history of asthma, nodule discharge EXAM: PORTABLE CHEST 1 VIEW COMPARISON:  None. FINDINGS: Normal mediastinum and cardiac silhouette. Normal pulmonary  vasculature. No evidence of effusion, infiltrate, or pneumothorax. No acute bony abnormality. IMPRESSION: No acute cardiopulmonary process. Electronically Signed   By: Suzy Bouchard M.D.   On: 06/09/2015 15:26    Microbiology: Recent Results (from the past 240 hour(s))  MRSA PCR Screening     Status: None   Collection Time: 06/09/15  9:49 PM  Result Value Ref Range Status   MRSA by PCR NEGATIVE NEGATIVE Final    Comment:        The GeneXpert MRSA Assay (FDA approved for NASAL specimens only), is one component of a comprehensive MRSA colonization surveillance program. It is not intended to diagnose MRSA infection nor to guide or monitor treatment for MRSA infections.      Labs:  Basic Metabolic Panel:  Recent Labs Lab 06/09/15 1634 06/09/15 1747 06/10/15 0519 06/11/15 0718 06/12/15 0340  NA 136  --  137 140 139  K 3.1*  --  3.0* 3.3* 4.5  CL 102  --  103 104 105  CO2 23  --  24 23 25   GLUCOSE 95  --  109* 110* 121*  BUN 8  --  9 14 21*  CREATININE 0.72  --  0.79 0.94 0.81  CALCIUM 10.3  --  9.4 10.6* 10.5*  MG  --  2.1  --  2.0  --    Liver Function Tests:  Recent Labs Lab 06/11/15 0718  AST 21  ALT 14  ALKPHOS 67  BILITOT 0.5  PROT 7.7  ALBUMIN 4.0   CBC:  Recent Labs Lab 06/09/15 1410 06/10/15 0519 06/11/15 0718 06/12/15 0340  WBC 5.6 5.2 5.1 11.8*  NEUTROABS  --   --  1.7  --   HGB 14.9 13.2 12.8 12.1  HCT 43.5 38.4 39.0 35.6*  MCV 79.4 79.7 79.9 79.8  PLT 340 300 301 305   Cardiac Enzymes:  Recent Labs Lab 06/09/15 2335 06/10/15 0519 06/10/15 1037 06/11/15 0718  TROPONINI <0.03 <0.03 <0.03 <0.03   BNP: BNP (last 3 results)  Recent Labs  06/11/15 0718  BNP 17.5   CBG:  Recent Labs Lab 06/10/15 0742  GLUCAP 103*       Signed:  GHERGHE, COSTIN  [facility] 06/12/2015, 12:27 PM

## 2015-06-12 NOTE — Discharge Instructions (Addendum)
Atrial Fibrillation °Atrial fibrillation is a type of irregular or rapid heartbeat (arrhythmia). In atrial fibrillation, the heart quivers continuously in a chaotic pattern. This occurs when parts of the heart receive disorganized signals that make the heart unable to pump blood normally. This can increase the risk for stroke, heart failure, and other heart-related conditions. There are different types of atrial fibrillation, including: °· Paroxysmal atrial fibrillation. This type starts suddenly, and it usually stops on its own shortly after it starts. °· Persistent atrial fibrillation. This type often lasts longer than a week. It may stop on its own or with treatment. °· Long-lasting persistent atrial fibrillation. This type lasts longer than 12 months. °· Permanent atrial fibrillation. This type does not go away. °Talk with your health care provider to learn about the type of atrial fibrillation that you have. °CAUSES °This condition is caused by some heart-related conditions or procedures, including: °· A heart attack. °· Coronary artery disease. °· Heart failure. °· Heart valve conditions. °· High blood pressure. °· Inflammation of the sac that surrounds the heart (pericarditis). °· Heart surgery. °· Certain heart rhythm disorders, such as Wolf-Parkinson-White syndrome. °Other causes include: °· Pneumonia. °· Obstructive sleep apnea. °· Blockage of an artery in the lungs (pulmonary embolism, or PE). °· Lung cancer. °· Chronic lung disease. °· Thyroid problems, especially if the thyroid is overactive (hyperthyroidism). °· Caffeine. °· Excessive alcohol use or illegal drug use. °· Use of some medicines, including certain decongestants and diet pills. °Sometimes, the cause cannot be found. °RISK FACTORS °This condition is more likely to develop in: °· People who are older in age. °· People who smoke. °· People who have diabetes mellitus. °· People who are overweight (obese). °· Athletes who exercise  vigorously. °SYMPTOMS °Symptoms of this condition include: °· A feeling that your heart is beating rapidly or irregularly. °· A feeling of discomfort or pain in your chest. °· Shortness of breath. °· Sudden light-headedness or weakness. °· Getting tired easily during exercise. °In some cases, there are no symptoms. °DIAGNOSIS °Your health care provider may be able to detect atrial fibrillation when taking your pulse. If detected, this condition may be diagnosed with: °· An electrocardiogram (ECG). °· A Holter monitor test that records your heartbeat patterns over a 24-hour period. °· Transthoracic echocardiogram (TTE) to evaluate how blood flows through your heart. °· Transesophageal echocardiogram (TEE) to view more detailed images of your heart. °· A stress test. °· Imaging tests, such as a CT scan or chest X-ray. °· Blood tests. °TREATMENT °The main goals of treatment are to prevent blood clots from forming and to keep your heart beating at a normal rate and rhythm. The type of treatment that you receive depends on many factors, such as your underlying medical conditions and how you feel when you are experiencing atrial fibrillation. °This condition may be treated with: °· Medicine to slow down the heart rate, bring the heart's rhythm back to normal, or prevent clots from forming. °· Electrical cardioversion. This is a procedure that resets your heart's rhythm by delivering a controlled, low-energy shock to the heart through your skin. °· Different types of ablation, such as catheter ablation, catheter ablation with pacemaker, or surgical ablation. These procedures destroy the heart tissues that send abnormal signals. When the pacemaker is used, it is placed under your skin to help your heart beat in a regular rhythm. °HOME CARE INSTRUCTIONS °· Take over-the counter and prescription medicines only as told by your health care provider. °·   If your health care provider prescribed a blood-thinning medicine  (anticoagulant), take it exactly as told. Taking too much blood-thinning medicine can cause bleeding. If you do not take enough blood-thinning medicine, you will not have the protection that you need against stroke and other problems.  Do not use tobacco products, including cigarettes, chewing tobacco, and e-cigarettes. If you need help quitting, ask your health care provider.  If you have obstructive sleep apnea, manage your condition as told by your health care provider.  Do not drink alcohol.  Do not drink beverages that contain caffeine, such as coffee, soda, and tea.  Maintain a healthy weight. Do not use diet pills unless your health care provider approves. Diet pills may make heart problems worse.  Follow diet instructions as told by your health care provider.  Exercise regularly as told by your health care provider.  Keep all follow-up visits as told by your health care provider. This is important. PREVENTION  Avoid drinking beverages that contain caffeine or alcohol.  Avoid certain medicines, especially medicines that are used for breathing problems.  Avoid certain herbs and herbal medicines, such as those that contain ephedra or ginseng.  Do not use illegal drugs, such as cocaine and amphetamines.  Do not smoke.  Manage your high blood pressure. SEEK MEDICAL CARE IF:  You notice a change in the rate, rhythm, or strength of your heartbeat.  You are taking an anticoagulant and you notice increased bruising.  You tire more easily when you exercise or exert yourself. SEEK IMMEDIATE MEDICAL CARE IF:  You have chest pain, abdominal pain, sweating, or weakness.  You feel nauseous.  You notice blood in your vomit, bowel movement, or urine.  You have shortness of breath.  You suddenly have swollen feet and ankles.  You feel dizzy.  You have sudden weakness or numbness of the face, arm, or leg, especially on one side of the body.  You have trouble speaking,  trouble understanding, or both (aphasia).  Your face or your eyelid droops on one side. These symptoms may represent a serious problem that is an emergency. Do not wait to see if the symptoms will go away. Get medical help right away. Call your local emergency services (911 in the [place] Do not drive yourself to the hospital.   This information is not intended to replace advice given to you by your health care provider. Make sure you discuss any questions you have with your health care provider.   Document Released: 08/09/2005 Document Revised: 04/30/2015 Document Reviewed: 12/04/2014 Elsevier Interactive Patient Education 2016 [place].    Follow with Marlou Sa, [name], MD in 5-7 days  For dry scalp you can use Selsun shampoo  Please get a complete blood count and chemistry panel checked by your Primary MD at your next visit, and again as instructed by your Primary MD. Please get your medications reviewed and adjusted by your Primary MD.  Please request your Primary MD to go over all Hospital Tests and Procedure/Radiological results at the follow up, please get all Hospital records sent to your Prim MD by signing hospital release before you go home.  If you had Pneumonia of Lung problems at the Hospital: Please get a 2 view Chest X ray done in 6-8 weeks after hospital discharge or sooner if instructed by your Primary MD.  If you have Congestive Heart Failure: Please call your Cardiologist or Primary MD anytime you have any of the following symptoms:  1) 3 pound weight gain in  24 hours or 5 pounds in 1 week  2) shortness of breath, with or without a dry hacking cough  3) swelling in the hands, feet or stomach  4) if you have to sleep on extra pillows at night in order to breathe  Follow cardiac low salt diet and 1.5 lit/day fluid restriction.  If you have diabetes Accuchecks 4 times/day, Once in AM empty stomach and then before each meal. Log in all results and show them to your  primary doctor at your next visit. If any glucose reading is under 80 or above 300 call your primary MD immediately.  If you have Seizure/Convulsions/Epilepsy: Please do not drive, operate heavy machinery, participate in activities at heights or participate in high speed sports until you have seen by Primary MD or a Neurologist and advised to do so again.  If you had Gastrointestinal Bleeding: Please ask your Primary MD to check a complete blood count within one week of discharge or at your next visit. Your endoscopic/colonoscopic biopsies that are pending at the time of discharge, will also need to followed by your Primary MD.  Get Medicines reviewed and adjusted. Please take all your medications with you for your next visit with your Primary MD  Please request your Primary MD to go over all hospital tests and procedure/radiological results at the follow up, please ask your Primary MD to get all Hospital records sent to his/her office.  If you experience worsening of your admission symptoms, develop shortness of breath, life threatening emergency, suicidal or homicidal thoughts you must seek medical attention immediately by calling 911 or calling your MD immediately  if symptoms less severe.  You must read complete instructions/literature along with all the possible adverse reactions/side effects for all the Medicines you take and that have been prescribed to you. Take any new Medicines after you have completely understood and accpet all the possible adverse reactions/side effects.   Do not drive or operate heavy machinery when taking Pain medications.   Do not take more than prescribed Pain, Sleep and Anxiety Medications  Special Instructions: If you have smoked or chewed Tobacco  in the last 2 yrs please stop smoking, stop any regular Alcohol  and or any Recreational drug use.  Wear Seat belts while driving.  Please note You were cared for by a hospitalist during your hospital stay. If  you have any questions about your discharge medications or the care you received while you were in the hospital after you are discharged, you can call the unit and asked to speak with the hospitalist on call if the hospitalist that took care of you is not available. Once you are discharged, your primary care physician will handle any further medical issues. Please note that NO REFILLS for any discharge medications will be authorized once you are discharged, as it is imperative that you return to your primary care physician (or establish a relationship with a primary care physician if you do not have one) for your aftercare needs so that they can reassess your need for medications and monitor your lab values.  You can reach the hospitalist office at phone [phone] or fax [phone]   If you do not have a primary care physician, you can call [phone] for a physician referral.  Activity: As tolerated with Full fall precautions use walker/cane & assistance as needed  Diet: heart healthy  Disposition Home

## 2015-07-01 ENCOUNTER — Emergency Department (HOSPITAL_COMMUNITY): Payer: Medicare Other

## 2015-07-01 ENCOUNTER — Emergency Department (HOSPITAL_COMMUNITY)
Admission: EM | Admit: 2015-07-01 | Discharge: 2015-07-01 | Disposition: A | Discharge status: Home / Self Care | Payer: Medicare Other | Attending: Emergency Medicine | Admitting: Emergency Medicine

## 2015-07-01 ENCOUNTER — Encounter (HOSPITAL_COMMUNITY): Payer: Self-pay

## 2015-07-01 DIAGNOSIS — I10 Essential (primary) hypertension: Secondary | ICD-10-CM | POA: Diagnosis not present

## 2015-07-01 DIAGNOSIS — Z87891 Personal history of nicotine dependence: Secondary | ICD-10-CM | POA: Diagnosis not present

## 2015-07-01 DIAGNOSIS — I4891 Unspecified atrial fibrillation: Secondary | ICD-10-CM

## 2015-07-01 DIAGNOSIS — I480 Paroxysmal atrial fibrillation: Secondary | ICD-10-CM | POA: Diagnosis not present

## 2015-07-01 DIAGNOSIS — Z79899 Other long term (current) drug therapy: Secondary | ICD-10-CM | POA: Insufficient documentation

## 2015-07-01 DIAGNOSIS — R002 Palpitations: Secondary | ICD-10-CM | POA: Diagnosis present

## 2015-07-01 DIAGNOSIS — F329 Major depressive disorder, single episode, unspecified: Secondary | ICD-10-CM | POA: Diagnosis not present

## 2015-07-01 DIAGNOSIS — J45909 Unspecified asthma, uncomplicated: Secondary | ICD-10-CM | POA: Insufficient documentation

## 2015-07-01 DIAGNOSIS — K219 Gastro-esophageal reflux disease without esophagitis: Secondary | ICD-10-CM | POA: Insufficient documentation

## 2015-07-01 DIAGNOSIS — G43909 Migraine, unspecified, not intractable, without status migrainosus: Secondary | ICD-10-CM | POA: Insufficient documentation

## 2015-07-01 DIAGNOSIS — Z7951 Long term (current) use of inhaled steroids: Secondary | ICD-10-CM | POA: Insufficient documentation

## 2015-07-01 LAB — CBC
HCT: 35.4 % — ABNORMAL LOW (ref 36.0–46.0)
HEMOGLOBIN: 11.9 g/dL — AB (ref 12.0–15.0)
MCH: 26.9 pg (ref 26.0–34.0)
MCHC: 33.6 g/dL (ref 30.0–36.0)
MCV: 80.1 fL (ref 78.0–100.0)
Platelets: 205 10*3/uL (ref 150–400)
RBC: 4.42 MIL/uL (ref 3.87–5.11)
RDW: 16.1 % — AB (ref 11.5–15.5)
WBC: 5.5 10*3/uL (ref 4.0–10.5)

## 2015-07-01 LAB — BASIC METABOLIC PANEL
Anion gap: 9 (ref 5–15)
BUN: 14 mg/dL (ref 6–20)
CALCIUM: 9.8 mg/dL (ref 8.9–10.3)
CO2: 26 mmol/L (ref 22–32)
Chloride: 101 mmol/L (ref 101–111)
Creatinine, Ser: 0.72 mg/dL (ref 0.44–1.00)
GFR calc Af Amer: >60 mL/min (ref >60)
GLUCOSE: 109 mg/dL — AB (ref 65–99)
Potassium: 3.2 mmol/L — ABNORMAL LOW (ref 3.5–5.1)
Sodium: 136 mmol/L (ref 135–145)

## 2015-07-01 MED ORDER — DILTIAZEM HCL ER COATED BEADS 240 MG PO CP24: 240.0000 mg | ORAL_CAPSULE | Freq: Every day | ORAL | Status: DC

## 2015-07-01 MED ORDER — DILTIAZEM HCL 100 MG IV SOLR
INTRAVENOUS | Status: AC
  Administered 2015-07-01: 5 mg/h via INTRAVENOUS
  Filled 2015-07-01: qty 100

## 2015-07-01 MED ORDER — DILTIAZEM LOAD VIA INFUSION
20.0000 mg | Freq: Once | INTRAVENOUS | Status: AC
  Administered 2015-07-01: 20 mg via INTRAVENOUS
  Filled 2015-07-01: qty 20

## 2015-07-01 MED ORDER — DEXTROSE 5 % IV SOLN
5.0000 mg/h | INTRAVENOUS | Status: DC
  Administered 2015-07-01: 5 mg/h via INTRAVENOUS

## 2015-07-01 MED ORDER — DILTIAZEM HCL ER COATED BEADS 180 MG PO CP24
180.0000 mg | ORAL_CAPSULE | Freq: Every day | ORAL | Status: DC
  Administered 2015-07-01: 180 mg via ORAL
  Filled 2015-07-01: qty 1

## 2015-07-01 MED ORDER — POTASSIUM CHLORIDE CRYS ER 20 MEQ PO TBCR
40.0000 meq | EXTENDED_RELEASE_TABLET | Freq: Once | ORAL | Status: AC
  Administered 2015-07-01: 40 meq via ORAL
  Filled 2015-07-01: qty 2

## 2015-07-01 NOTE — ED Notes (Signed)
Cardiology at bedside.

## 2015-07-01 NOTE — ED Provider Notes (Signed)
CSN: [id number]     Arrival date & time 07/01/15  1254 History   First MD Initiated Contact with Patient 07/01/15 1332     Chief Complaint  Patient presents with  . Palpitations   [name] is a 64 y.o. female with a history of asthma, hypertension, A. fib with RVR, and depression who presents to the emergency department complaining of palpitations with onset around 12 PM today. Patient reports that she accidentally forgot to take her diltiazem last night. Today around 12 PM while sitting on her couch watching TV she started feeling like her heart was racing. She reports this feels like the last time she had A. fib RVR. She reports this time she is not having any chest pain or shortness of breath. She reports she is taking Xarelto. The patient denies fevers, chills, recent illness, cough, wheezing, shortness of breath, chest pain, syncope, lightheadedness, dizziness, abdominal pain, nausea or vomiting.  (Consider location/radiation/quality/duration/timing/severity/associated sxs/prior Treatment) HPI  Past Medical History  Diagnosis Date  . Asthma   . Hypertension   . GERD (gastroesophageal reflux disease)   . A-fib ([place])   . Migraine   . Depression    Past Surgical History  Procedure Laterality Date  . Tubal ligation     Family History  Problem Relation Age of Onset  . Cancer Mother   . Cancer Father   . Heart attack Mother 60   Social History  Substance Use Topics  . Smoking status: Former Smoker    Types: Cigarettes    Quit date: 11/07/2011  . Smokeless tobacco: Never Used  . Alcohol Use: No   OB History    No data available     Review of Systems  Constitutional: Negative for fever and chills.  HENT: Negative for congestion and sore throat.   Eyes: Negative for visual disturbance.  Respiratory: Negative for cough, shortness of breath and wheezing.   Cardiovascular: Positive for palpitations. Negative for chest pain and leg swelling.  Gastrointestinal: Negative  for nausea, vomiting and abdominal pain.  Genitourinary: Negative for dysuria.  Musculoskeletal: Negative for back pain and neck pain.  Skin: Negative for rash.  Neurological: Negative for dizziness, syncope, weakness, light-headedness and headaches.      Allergies  Pantoprazole and Celecoxib  Home Medications   Prior to Admission medications   Medication Sig Start Date End Date Taking? Authorizing Provider  albuterol (PROVENTIL HFA;VENTOLIN HFA) 108 (90 BASE) MCG/ACT inhaler Inhale 2 puffs into the lungs every 4 (four) hours as needed for wheezing or shortness of breath. 12/06/12  Yes Montine Circle, PA-C  albuterol (PROVENTIL) (2.5 MG/3ML) 0.083% nebulizer solution Take 3 mLs (2.5 mg total) by nebulization every 6 (six) hours as needed for wheezing or shortness of breath. 07/07/14  Yes Quintella Reichert, MD  diltiazem (CARDIZEM CD) 180 MG 24 hr capsule Take 1 capsule (180 mg total) by mouth daily. 06/10/15  Yes Nita Sells, MD  feeding supplement, ENSURE ENLIVE, (ENSURE ENLIVE) LIQD Take 237 mLs by mouth 2 (two) times daily between meals. 06/12/15  Yes Costin Karlyne Greenspan, MD  fluticasone (FLOVENT HFA) 110 MCG/ACT inhaler Inhale 1 puff into the lungs 2 (two) times daily. 06/07/14  Yes Cherene Altes, MD  hydrochlorothiazide (HYDRODIURIL) 25 MG tablet Take 25 mg by mouth daily. 05/13/15  Yes Historical Provider, MD  hydrocortisone cream 1 % Apply to affected area 3 times daily 08/14/14  Yes Al Corpus, PA-C  hydrOXYzine (ATARAX/VISTARIL) 25 MG tablet Take 1 tablet (25 mg total)  by mouth every 6 (six) hours. 06/01/15  Yes [name], DO  loratadine (CLARITIN) 10 MG tablet Take 1 tablet (10 mg total) by mouth daily. 05/13/15  Yes Jeffrey Hedges, PA-C  potassium chloride SA (K-DUR,KLOR-CON) 20 MEQ tablet Take 20 mEq by mouth daily. 05/01/15  Yes Historical Provider, MD  salmeterol (SEREVENT DISKUS) 50 MCG/DOSE diskus inhaler Inhale 1 puff into the lungs 2 (two) times daily. 06/07/14  Yes  Cherene Altes, MD  tiotropium (SPIRIVA HANDIHALER) 18 MCG inhalation capsule Place 1 capsule (18 mcg total) into inhaler and inhale daily. 06/22/13  Yes Lacretia Leigh, MD  triamcinolone (KENALOG) 0.025 % ointment Apply 1 application topically 2 (two) times daily. 05/18/15  Yes Hanna Patel-Mills, PA-C  rivaroxaban (XARELTO) 20 MG TABS tablet Take 1 tablet (20 mg total) by mouth daily with supper. 06/10/15   Nita Sells, MD   BP 122/84 mmHg  Pulse 75  Temp(Src) 98.1 F (36.7 C) (Oral)  Resp 15  SpO2 92% Physical Exam  Constitutional: She appears well-developed and well-nourished. No distress.  Nontoxic appearing.  HENT:  Head: Normocephalic and atraumatic.  Mouth/Throat: Oropharynx is clear and moist.  Eyes: Conjunctivae are normal. Pupils are equal, round, and reactive to light. Right eye exhibits no discharge. Left eye exhibits no discharge.  Neck: Normal range of motion. Neck supple. No JVD present. No tracheal deviation present.  Cardiovascular: Normal heart sounds and intact distal pulses.  Exam reveals no gallop and no friction rub.   No murmur heard. Tachycardic with a heart rate of 160. A. fib RVR on the monitor. Bilateral radial pulses are intact. Good capillary refill to her eye lateral upper extremities.  Pulmonary/Chest: Effort normal and breath sounds normal. No respiratory distress. She has no wheezes. She has no rales.  Lungs clear to auscultation bilaterally.  Abdominal: Soft. There is no tenderness.  Musculoskeletal: She exhibits no edema or tenderness.  No lower extremity edema or tenderness.  Lymphadenopathy:    She has no cervical adenopathy.  Neurological: She is alert. Coordination normal.  Skin: Skin is warm and dry. No rash noted. She is not diaphoretic. No erythema. No pallor.  Psychiatric: She has a normal mood and affect. Her behavior is normal.  Nursing note and vitals reviewed.   ED Course  Procedures (including critical care time) Labs  Review Labs Reviewed  BASIC METABOLIC PANEL - Abnormal; Notable for the following:    Potassium 3.2 (*)    Glucose, Bld 109 (*)    All other components within normal limits  CBC - Abnormal; Notable for the following:    Hemoglobin 11.9 (*)    HCT 35.4 (*)    RDW 16.1 (*)    All other components within normal limits    Imaging Review Dg Chest 2 View  07/01/2015  CLINICAL DATA:  Atrial fibrillation. Rapid heart rate. Hypertension. EXAM: CHEST  2 VIEW COMPARISON:  Chest x-ray dated 06/11/2015. FINDINGS: Heart size is upper normal, unchanged. Overall cardiomediastinal silhouette is stable in size and configuration. Lungs are hyperexpanded suggesting COPD. Lungs are clear. No acute osseous abnormality. IMPRESSION: Hyperexpanded lungs suggesting COPD. No evidence of acute cardiopulmonary abnormality. Electronically Signed   By: Franki Cabot M.D.   On: 07/01/2015 14:25   I have personally reviewed and evaluated these images and lab results as part of my medical decision-making.   EKG Interpretation   Date/Time:  Tuesday July 01 2015 15:03:11 EST Ventricular Rate:  90 PR Interval:    QRS Duration: 90 QT Interval:  412 QTC Calculation: 504 R Axis:   57 Text Interpretation:  Atrial fibrillation Ventricular premature complex  Confirmed by BELFI  MD, MELANIE ([id number]) on 07/01/2015 4:21:44 PM      Filed Vitals:   07/01/15 1349 07/01/15 1430 07/01/15 1445 07/01/15 1530  BP: 113/78 101/82 107/79 122/84  Pulse: 97 83 84 75  Temp:      TempSrc:      Resp: 19 20 18 15   SpO2: 97% 97% 97% 92%     MDM   Meds given in ED:  Medications  diltiazem (CARDIZEM) 1 mg/mL load via infusion 20 mg (20 mg Intravenous Given 07/01/15 1345)    And  diltiazem (CARDIZEM) 100 mg in dextrose 5 % 100 mL (1 mg/mL) infusion (5 mg/hr Intravenous New Bag/Given 07/01/15 1347)  diltiazem (CARDIZEM CD) 24 hr capsule 180 mg (not administered)  potassium chloride SA (K-DUR,KLOR-CON) CR tablet 40 mEq (40 mEq Oral  Given 07/01/15 1612)    New Prescriptions   No medications on file    Final diagnoses:  Atrial fibrillation with rapid ventricular response ([place])   This is a 64 y.o. female with a history of asthma, hypertension, A. fib with RVR, and depression who presents to the emergency department complaining of palpitations with onset around 12 PM today. Patient reports that she accidentally forgot to take her diltiazem last night. Today around 12 PM while sitting on her couch watching TV she started feeling like her heart was racing. She reports this feels like the last time she had A. fib RVR. She reports this time she is not having any chest pain or shortness of breath. She reports she is taking Xarelto.  On exam the patient is afebrile nontoxic appearing. She is tachycardic with a heart rate in the 160s. She is in A. fib RVR on the monitor. Her blood pressure is stable at 140/80. Will initiate on Cardizem 20mg  bolus and and start Cardizem drip.  Reevaluation the patient is rate controlled with a heart rate in the 80s but still in A. Fib. BMP is remarkable only for potassium of 3.2. CBC is unremarkable. Chest x-ray suggests COPD no evidence of acute cardiopulmonary abnormality. Will consult cardiology.  Cardiology was consulted and plan is to move her over to oral Cardizem and taper off Cardizem drip over the next hour and a half. Plan is for discharge and follow-up in office later this week. At shift change patient is awaiting oral Cardizem from pharmacy. After oral dose will taper off drip and if she remains rate controlled will discharge. Patient care handed off to Voncille Lo, MD at shift change who will disposition the patient.    This patient was discussed with and evaluated by Dr. Tamera Punt who agrees with assessment and plan.   CRITICAL CARE Performed by: Hanley Hays   Total critical care time: 35 minutes  Critical care time was exclusive of separately billable procedures and treating  other patients.  Critical care was necessary to treat or prevent imminent or life-threatening deterioration.  Critical care was time spent personally by me on the following activities: development of treatment plan with patient and/or surrogate as well as nursing, discussions with consultants, evaluation of patient's response to treatment, examination of patient, obtaining history from patient or surrogate, ordering and performing treatments and interventions, ordering and review of laboratory studies, ordering and review of radiographic studies, pulse oximetry and re-evaluation of patient's condition.    Waynetta Pean, PA-C 07/01/15 [place], MD 07/05/15 [phone]

## 2015-07-01 NOTE — ED Notes (Addendum)
Pt reports missing Diltiazem pill last night. Reports onset of palpitations appx 1 hour ago. Denies CP, SOB or dizziness. Found to be in A Fib with RVR

## 2015-07-01 NOTE — Discharge Instructions (Signed)
Atrial Fibrillation °Atrial fibrillation is a type of heartbeat that is irregular or fast (rapid). If you have this condition, your heart keeps quivering in a weird (chaotic) way. This condition can make it so your heart cannot pump blood normally. Having this condition gives a person more risk for stroke, heart failure, and other heart problems. There are different types of atrial fibrillation. Talk with your doctor to learn about the type that you have. °HOME CARE °· Take over-the-counter and prescription medicines only as told by your doctor. °· If your doctor prescribed a blood-thinning medicine, take it exactly as told. Taking too much of it can cause bleeding. If you do not take enough of it, you will not have the protection that you need against stroke and other problems. °· Do not use any tobacco products. These include cigarettes, chewing tobacco, and e-cigarettes. If you need help quitting, ask your doctor. °· If you have apnea (obstructive sleep apnea), manage it as told by your doctor. °· Do not drink alcohol. °· Do not drink beverages that have caffeine. These include coffee, soda, and tea. °· Maintain a healthy weight. Do not use diet pills unless your doctor says they are safe for you. Diet pills may make heart problems worse. °· Follow diet instructions as told by your doctor. °· Exercise regularly as told by your doctor. °· Keep all follow-up visits as told by your doctor. This is important. °GET HELP IF: °· You notice a change in the speed, rhythm, or strength of your heartbeat. °· You are taking a blood-thinning medicine and you notice more bruising. °· You get tired more easily when you move or exercise. °GET HELP RIGHT AWAY IF: °· You have pain in your chest or your belly (abdomen). °· You have sweating or weakness. °· You feel sick to your stomach (nauseous). °· You notice blood in your throw up (vomit), poop (stool), or pee (urine). °· You are short of breath. °· You suddenly have swollen feet  and ankles. °· You feel dizzy. °· Your suddenly get weak or numb in your face, arms, or legs, especially if it happens on one side of your body. °· You have trouble talking, trouble understanding, or both. °· Your face or your eyelid droops on one side. °These symptoms may be an emergency. Do not wait to see if the symptoms will go away. Get medical help right away. Call your local emergency services (911 in the [place] Do not drive yourself to the hospital. °  °This information is not intended to replace advice given to you by your health care provider. Make sure you discuss any questions you have with your health care provider. °  °Document Released: 05/18/2008 Document Revised: 04/30/2015 Document Reviewed: 12/04/2014 °Elsevier Interactive Patient Education ©2016 [employer] ° °

## 2015-07-01 NOTE — ED Provider Notes (Signed)
I assumed care of [name] from Dr. Sabra Heck at their end-of-shift check-out. Patient is a 64 y.o. female who presented for evaluation of Palpitations  I have reviewed their documentation of the patient's HPI, ROS, Physical Exam.  Please refer to their MDM and documentation for course prior to now. Treatments, Labs, and Imaging personally viewed by myself & considered in my MDM.  BP 120/76 mmHg  Pulse 85  Temp(Src) 98.1 F (36.7 C) (Oral)  Resp 20  SpO2 97% Results for orders placed or performed during the hospital encounter of 50/56/97  Basic metabolic panel  Result Value Ref Range   Sodium 136 135 - 145 mmol/L   Potassium 3.2 (L) 3.5 - 5.1 mmol/L   Chloride 101 101 - 111 mmol/L   CO2 26 22 - 32 mmol/L   Glucose, Bld 109 (H) 65 - 99 mg/dL   BUN 14 6 - 20 mg/dL   Creatinine, Ser 0.72 0.44 - 1.00 mg/dL   Calcium 9.8 8.9 - 10.3 mg/dL   GFR calc non Af Amer >60 >60 mL/min   GFR calc Af Amer >60 >60 mL/min   Anion gap 9 5 - 15  CBC  Result Value Ref Range   WBC 5.5 4.0 - 10.5 K/uL   RBC 4.42 3.87 - 5.11 MIL/uL   Hemoglobin 11.9 (L) 12.0 - 15.0 g/dL   HCT 35.4 (L) 36.0 - 46.0 %   MCV 80.1 78.0 - 100.0 fL   MCH 26.9 26.0 - 34.0 pg   MCHC 33.6 30.0 - 36.0 g/dL   RDW 16.1 (H) 11.5 - 15.5 %   Platelets 205 150 - 400 K/uL   Dg Chest 2 View  07/01/2015  CLINICAL DATA:  Atrial fibrillation. Rapid heart rate. Hypertension. EXAM: CHEST  2 VIEW COMPARISON:  Chest x-ray dated 06/11/2015. FINDINGS: Heart size is upper normal, unchanged. Overall cardiomediastinal silhouette is stable in size and configuration. Lungs are hyperexpanded suggesting COPD. Lungs are clear. No acute osseous abnormality. IMPRESSION: Hyperexpanded lungs suggesting COPD. No evidence of acute cardiopulmonary abnormality. Electronically Signed   By: Franki Cabot M.D.   On: 07/01/2015 14:25   Upon assumption of the patient's care, they require transition off of IV ggt Diltiazem with transition to PO diltiazem and  discharge with PCP follow up.   ED course update, reveals after administration of PO diltiazem 108 mg, the patient is in NSR without tachycardia.  Patient given explicit precautions to return to the ER including any other new or worsening symptoms. The patient understands, and I both nursing and I confirmed there are no further concerns or questions. Discharge instructions concerning symptomatic care and follow up have been given. Patient requires PO . The patient is stable and is discharged to home in good condition.   Clinical Impression:  1. Atrial fibrillation with rapid ventricular response (HCC)    Disposition:  Patient to follow up with PCP as needed and return precautions discussed for worsening or new concerning symptoms. Patient care discussed with Dr. Sabra Heck, who oversaw their evaluation & treatment & voiced agreement. House Officer: Voncille Lo, MD, Emergency Medicine.  Voncille Lo, MD 07/01/15 1933  Noemi Chapel, MD 07/02/15 [phone]

## 2015-07-01 NOTE — H&P (Signed)
[name] is an 64 y.o. female.   Chief Complaint:  Increased HR HPI:  The patient is a 63yo female with a history of Afib, HTN, asthma, GERD, migraine,. Marijuana use and depression. The patient was admitted Oct 17 and discharged on 18th with Afib RVR. She was placed on Cardizem CD 180 and Xarelto 20 mg daily. She converted spontaneously. TSH 3.6, T4 1.06. She had an echo which revealed an EF of 60-65%, G1DD, mild to moderate TR. Her sister died last Oct 13, 2022 from unknown cause.  Her mother died at age 27 from an MI.  She presents in Afib RVR. Cardizem 65m given with gtt started. She reports her HR started beating fast today around 1230hrs.  She missed her dose of cardizem last night but not the Xarelto.  The patient currently denies nausea, vomiting, fever, chest pain, shortness of breath, orthopnea, dizziness, PND, cough, congestion, abdominal pain, hematochezia, melena, lower extremity edema, claudication.  Yesterday she had a bad headache.  She has been drinking Ensure daily and has gained weight according to her.   Medications: Medication Sig  albuterol (PROVENTIL HFA;VENTOLIN HFA) 108 (90 BASE) MCG/ACT inhaler Inhale 2 puffs into the lungs every 4 (four) hours as needed for wheezing or shortness of breath.  albuterol (PROVENTIL) (2.5 MG/3ML) 0.083% nebulizer solution Take 3 mLs (2.5 mg total) by nebulization every 6 (six) hours as needed for wheezing or shortness of breath.  diltiazem (CARDIZEM CD) 180 MG 24 hr capsule Take 1 capsule (180 mg total) by mouth daily.  feeding supplement, ENSURE ENLIVE, (ENSURE ENLIVE) LIQD Take 237 mLs by mouth 2 (two) times daily between meals.  fluticasone (FLOVENT HFA) 110 MCG/ACT inhaler Inhale 1 puff into the lungs 2 (two) times daily.  hydrochlorothiazide (HYDRODIURIL) 25 MG tablet Take 25 mg by mouth daily.  hydrocortisone cream 1 % Apply to affected area 3 times daily  hydrOXYzine (ATARAX/VISTARIL) 25 MG tablet Take 1 tablet (25 mg  total) by mouth every 6 (six) hours.  loratadine (CLARITIN) 10 MG tablet Take 1 tablet (10 mg total) by mouth daily.  potassium chloride SA (K-DUR,KLOR-CON) 20 MEQ tablet Take 20 mEq by mouth daily.  salmeterol (SEREVENT DISKUS) 50 MCG/DOSE diskus inhaler Inhale 1 puff into the lungs 2 (two) times daily.  tiotropium (SPIRIVA HANDIHALER) 18 MCG inhalation capsule Place 1 capsule (18 mcg total) into inhaler and inhale daily.  triamcinolone (KENALOG) 0.025 % ointment Apply 1 application topically 2 (two) times daily.  rivaroxaban (XARELTO) 20 MG TABS tablet Take 1 tablet (20 mg total) by mouth daily with supper.     Past Medical History  Diagnosis Date  . Asthma   . Hypertension   . GERD (gastroesophageal reflux disease)   [place]   . Migraine   . Depression     Past Surgical History  Procedure Laterality Date  . Tubal ligation      Family History  Problem Relation Age of Onset  . Cancer Mother   . Cancer Father   . Heart attack Mother 629  Social History:  reports that she quit smoking about 3 years ago. Her smoking use included Cigarettes. She has never used smokeless tobacco. She reports that she uses illicit drugs (Marijuana). She reports that she does not drink alcohol.  Allergies:  Allergies  Allergen Reactions  . Pantoprazole Nausea And Vomiting and Other (See Comments)    dizziness  . Celecoxib Rash     (Not in a hospital admission)  Results for orders placed  or performed during the hospital encounter of 07/01/15 (from the past 48 hour(s))  Basic metabolic panel     Status: Abnormal   Collection Time: 07/01/15  1:16 PM  Result Value Ref Range   Sodium 136 135 - 145 mmol/L   Potassium 3.2 (L) 3.5 - 5.1 mmol/L   Chloride 101 101 - 111 mmol/L   CO2 26 22 - 32 mmol/L   Glucose, Bld 109 (H) 65 - 99 mg/dL   BUN 14 6 - 20 mg/dL   Creatinine, Ser 0.72 0.44 - 1.00 mg/dL   Calcium 9.8 8.9 - 10.3 mg/dL   GFR calc non Af Amer >60 >60 mL/min   GFR calc Af Amer  >60 >60 mL/min    Comment: (NOTE) The eGFR has been calculated using the CKD EPI equation. This calculation has not been validated in all clinical situations. eGFR's persistently <60 mL/min signify possible Chronic Kidney Disease.    Anion gap 9 5 - 15  CBC     Status: Abnormal   Collection Time: 07/01/15  1:16 PM  Result Value Ref Range   WBC 5.5 4.0 - 10.5 K/uL   RBC 4.42 3.87 - 5.11 MIL/uL   Hemoglobin 11.9 (L) 12.0 - 15.0 g/dL   HCT 35.4 (L) 36.0 - 46.0 %   MCV 80.1 78.0 - 100.0 fL   MCH 26.9 26.0 - 34.0 pg   MCHC 33.6 30.0 - 36.0 g/dL   RDW 16.1 (H) 11.5 - 15.5 %   Platelets 205 150 - 400 K/uL   Dg Chest 2 View  07/01/2015  CLINICAL DATA:  Atrial fibrillation. Rapid heart rate. Hypertension. EXAM: CHEST  2 VIEW COMPARISON:  Chest x-ray dated 06/11/2015. FINDINGS: Heart size is upper normal, unchanged. Overall cardiomediastinal silhouette is stable in size and configuration. Lungs are hyperexpanded suggesting COPD. Lungs are clear. No acute osseous abnormality. IMPRESSION: Hyperexpanded lungs suggesting COPD. No evidence of acute cardiopulmonary abnormality. Electronically Signed   By: Franki Cabot M.D.   On: 07/01/2015 14:25    Review of Systems  Constitutional: Negative for fever and diaphoresis.  HENT: Negative for congestion.   Respiratory: Negative for cough and shortness of breath.   Cardiovascular: Positive for palpitations. Negative for chest pain, orthopnea, claudication, leg swelling and PND.  Gastrointestinal: Negative for nausea, vomiting, abdominal pain, blood in stool and melena.  Musculoskeletal: Negative for myalgias.  Neurological: Negative for dizziness.  All other systems reviewed and are negative.   Blood pressure 113/78, pulse 97, temperature 98.1 F (36.7 C), temperature source Oral, resp. rate 19, SpO2 97 %. Physical Exam  Nursing note and vitals reviewed.   Well nourished, well developed, in no acute distress HEENT: Pupils are equal round react  to light accommodation extraocular movements are intact.  Neck: no JVDNo cervical lymphadenopathy. Cardiac: irreg rate and rhythm without murmurs rubs or gallops. Lungs:  clear to auscultation bilaterally, no wheezing, rhonchi or rales Abd: soft, nontender, positive bowel sounds all quadrants, no hepatosplenomegaly Ext: no lower extremity edema.  2+ radial and dorsalis pedis pulses. Skin: warm and dry Neuro:  Grossly normal   Assessment/Plan Afib RVR Hypokalmemia  HR improved with bolus and cardizem drip.  She feels much better.  I have ordered Cardizem 24hr 158m to give now.  DC IV dilt after 1.5 hours.  Replete potassium now.  I think she can go home now and follow up in the office.  Dr. HEllyn Hackto see first.    HTarri Fuller, P[facility] 07/01/2015, 3:35  PM

## 2015-07-11 ENCOUNTER — Ambulatory Visit (INDEPENDENT_AMBULATORY_CARE_PROVIDER_SITE_OTHER): Payer: Medicare Other | Admitting: Physician Assistant

## 2015-07-11 ENCOUNTER — Encounter: Payer: Self-pay | Admitting: Cardiology

## 2015-07-11 ENCOUNTER — Encounter: Payer: Self-pay | Admitting: Physician Assistant

## 2015-07-11 VITALS — BP 104/56 | HR 71 | Ht 67.0 in | Wt 120.5 lb

## 2015-07-11 DIAGNOSIS — I4891 Unspecified atrial fibrillation: Secondary | ICD-10-CM | POA: Diagnosis not present

## 2015-07-11 DIAGNOSIS — I10 Essential (primary) hypertension: Secondary | ICD-10-CM | POA: Diagnosis not present

## 2015-07-11 DIAGNOSIS — I480 Paroxysmal atrial fibrillation: Secondary | ICD-10-CM

## 2015-07-11 NOTE — Progress Notes (Signed)
Patient ID: [name], female   DOB: [birth year], 64 y.o.   MRN: [id number]    Date:  07/11/2015   ID:  [name], DOB 05/26/1951, MRN [id number]  PCP:  Shelby Ny, MD  Primary Cardiologist:  [name](new)   Chief Complaint  Patient presents with  . Follow-up    pt c/o numbness in left arm and hand//no other Sx.     History of Present Illness: [name] is a 64 y.o. female with a history of Afib, HTN, asthma, GERD, migraine,. Marijuana use and depression. The patient was admitted Oct 17 and discharged on 18th with Afib RVR. She was placed on Cardizem CD 180 and Xarelto 20 mg daily. She converted spontaneously. TSH 3.6, T4 1.06. She had an echo which revealed an EF of 60-65%, G1DD, mild to moderate TR.  Her mother died at age 27 from an MI.  She presented in Afib RVR to [facility] ER on 11/8 . Cardizem 20mg  given with gtt started. She missed her dose of cardizem last night but not the Xarelto. She has been drinking Ensure daily and has gained weight. Heart rate improved and she was discharged from the emergency room on 100 mg grams of Cardizem daily she was also given potassium supplement. She is now here for posthospital follow-up.  She reports doing very well but has had some left arm numbness when she wakes up in the morning. For some reason Cardizem 120 mg and 240 mg was on her med list and she's been taking both of them. We'll discharge her from the ER on just the 240.  The patient currently denies nausea, vomiting, fever, chest pain, shortness of breath, orthopnea, dizziness, PND, cough, congestion, abdominal pain, hematochezia, melena, lower extremity edema, claudication.  Wt Readings from Last 3 Encounters:  07/11/15 120 lb 8 oz (54.658 kg)  06/12/15 111 lb 3.2 oz (50.44 kg)  06/10/15 109 lb 2 oz (49.5 kg)     Past Medical History  Diagnosis Date  . Asthma   . Hypertension   . GERD (gastroesophageal reflux disease)   . A-fib ([place])   . Migraine   .  Depression     Current Outpatient Prescriptions  Medication Sig Dispense Refill  . albuterol (PROVENTIL) (2.5 MG/3ML) 0.083% nebulizer solution Take 3 mLs (2.5 mg total) by nebulization every 6 (six) hours as needed for wheezing or shortness of breath. 75 mL 12  . diltiazem (CARDIZEM CD) 240 MG 24 hr capsule Take 1 capsule (240 mg total) by mouth daily. 30 capsule 5  . feeding supplement, ENSURE ENLIVE, (ENSURE ENLIVE) LIQD Take 237 mLs by mouth 2 (two) times daily between meals. 60 Bottle 2  . fluticasone (FLOVENT HFA) 110 MCG/ACT inhaler Inhale 1 puff into the lungs 2 (two) times daily. 1 Inhaler 0  . Fluticasone-Salmeterol (ADVAIR DISKUS) 250-50 MCG/DOSE AEPB Inhale 1 puff into the lungs 2 (two) times daily.    . hydrochlorothiazide (HYDRODIURIL) 25 MG tablet Take 25 mg by mouth daily.  3  . hydrocortisone cream 1 % Apply to affected area 3 times daily 15 g 0  . hydrOXYzine (ATARAX/VISTARIL) 25 MG tablet Take 1 tablet (25 mg total) by mouth every 6 (six) hours. 12 tablet 0  . lisinopril (PRINIVIL,ZESTRIL) 20 MG tablet Take 20 mg by mouth daily.    Marland Kitchen loratadine (CLARITIN) 10 MG tablet Take 1 tablet (10 mg total) by mouth daily. 30 tablet 0  . metoCLOPramide (REGLAN) 5 MG tablet Take 5 mg by mouth  4 (four) times daily.    . potassium chloride SA (K-DUR,KLOR-CON) 20 MEQ tablet Take 20 mEq by mouth daily.  2  . rivaroxaban (XARELTO) 20 MG TABS tablet Take 1 tablet (20 mg total) by mouth daily with supper. 30 tablet 0  . tiotropium (SPIRIVA HANDIHALER) 18 MCG inhalation capsule Place 1 capsule (18 mcg total) into inhaler and inhale daily. 30 capsule 0  . triamcinolone (KENALOG) 0.025 % ointment Apply 1 application topically 2 (two) times daily. 30 g 0  . [DISCONTINUED] apixaban (ELIQUIS) 5 MG TABS tablet Take 1 tablet (5 mg total) by mouth 2 (two) times daily. (Patient not taking: Reported on 07/07/2014) 60 tablet 0   No current facility-administered medications for this visit.    Allergies:     Allergies  Allergen Reactions  . Pantoprazole Nausea And Vomiting and Other (See Comments)    dizziness  . Celecoxib Rash    Social History:  The patient  reports that she quit smoking about 3 years ago. Her smoking use included Cigarettes. She has never used smokeless tobacco. She reports that she uses illicit drugs (Marijuana). She reports that she does not drink alcohol.   Family history:   Family History  Problem Relation Age of Onset  . Cancer Mother   . Cancer Father   . Heart attack Mother 6    ROS:  Please see the history of present illness.  All other systems reviewed and negative.   PHYSICAL EXAM: VS:  BP 104/56 mmHg  Pulse 71  Ht 5\' 7"  (1.702 m)  Wt 120 lb 8 oz (54.658 kg)  BMI 18.87 kg/m2 Well nourished, well developed, in no acute distress HEENT: Pupils are equal round react to light accommodation extraocular movements are intact.  Neck: no JVDNo cervical lymphadenopathy. Cardiac: Regular rate and rhythm without murmurs rubs or gallops. Lungs:  clear to auscultation bilaterally, no wheezing, rhonchi or rales Abd: soft, nontender, positive bowel sounds all quadrants, no hepatosplenomegaly Ext: no lower extremity edema.  2+ radial and dorsalis pedis pulses. Skin: warm and dry Neuro:  Grossly normal  EKG:  Normal sinus rhythm rate of 71 bpm  ASSESSMENT AND PLAN:  Problem List Items Addressed This Visit    Paroxysmal atrial fibrillation (HCC)   Relevant Medications   lisinopril (PRINIVIL,ZESTRIL) 20 MG tablet   Essential hypertension - Primary   Relevant Medications   lisinopril (PRINIVIL,ZESTRIL) 20 MG tablet   Other Relevant Orders   EKG 12-Lead   Myocardial Perfusion Imaging   Atrial fibrillation with RVR (HCC)   Relevant Medications   lisinopril (PRINIVIL,ZESTRIL) 20 MG tablet   Other Relevant Orders   EKG 12-Lead   Myocardial Perfusion Imaging      Patient is here for posthospital follow-up she reports doing really well taken 120 mg of  Cardizem off of her med list and thrown away the bottle.  Blood pressures on the low side of normal so hopefully this will help although, she's not been feeling poorly in any way. We'll reschedule her for a Lexiscan Myoview to rule out ischemic cause for her A. fib. Her mother was 71 when she died of MI. Follow-up in 3 months or sooner depending on Myoview results.

## 2015-07-11 NOTE — Patient Instructions (Signed)
Your physician wants you to follow-up in: 3 Months with Dr Ellyn Hack. You will receive a reminder letter in the mail two months in advance. If you don't receive a letter, please call our office to schedule the follow-up appointment.  Your physician has requested that you have a lexiscan myoview. For further information please visit [email or web address]. Please follow instruction sheet, as given.

## 2015-07-16 DIAGNOSIS — I10 Essential (primary) hypertension: Secondary | ICD-10-CM | POA: Diagnosis not present

## 2015-07-16 DIAGNOSIS — Z720 Tobacco use: Secondary | ICD-10-CM | POA: Diagnosis not present

## 2015-07-16 DIAGNOSIS — I4891 Unspecified atrial fibrillation: Secondary | ICD-10-CM | POA: Diagnosis not present

## 2015-07-16 DIAGNOSIS — E876 Hypokalemia: Secondary | ICD-10-CM | POA: Diagnosis not present

## 2015-08-05 ENCOUNTER — Encounter (HOSPITAL_COMMUNITY): Payer: Self-pay

## 2015-09-15 ENCOUNTER — Telehealth: Payer: Self-pay | Admitting: Physician Assistant

## 2015-09-15 NOTE — Telephone Encounter (Signed)
No. She has no cardiac problem that requires her to miss Jury duty.  Please find out why she has not had the stress test that was ordered in November.  Thanks, Tarri Fuller, [facility]

## 2015-09-15 NOTE — Telephone Encounter (Signed)
Will forward to brian hager pa for review and okay.

## 2015-09-15 NOTE — Telephone Encounter (Signed)
Spoke with pt, she does not want to go. She is not having any problems. Aware we can not excuse her from a cardiac standpoint. Patient voiced understanding

## 2015-09-15 NOTE — Telephone Encounter (Signed)
Pt says she needs a letter to be excused from jury duty please.

## 2015-09-22 IMAGING — CR DG CHEST 2V
2 series · 2 of 2 positions shown · non-contrast
Comparison: Chest radiograph February 01, 2014

CLINICAL DATA: Cough and congestion for 2 weeks, chest pain. No
fever. Asthma. Prior smoker.

EXAM:
CHEST  2 VIEW

[w chest pa]
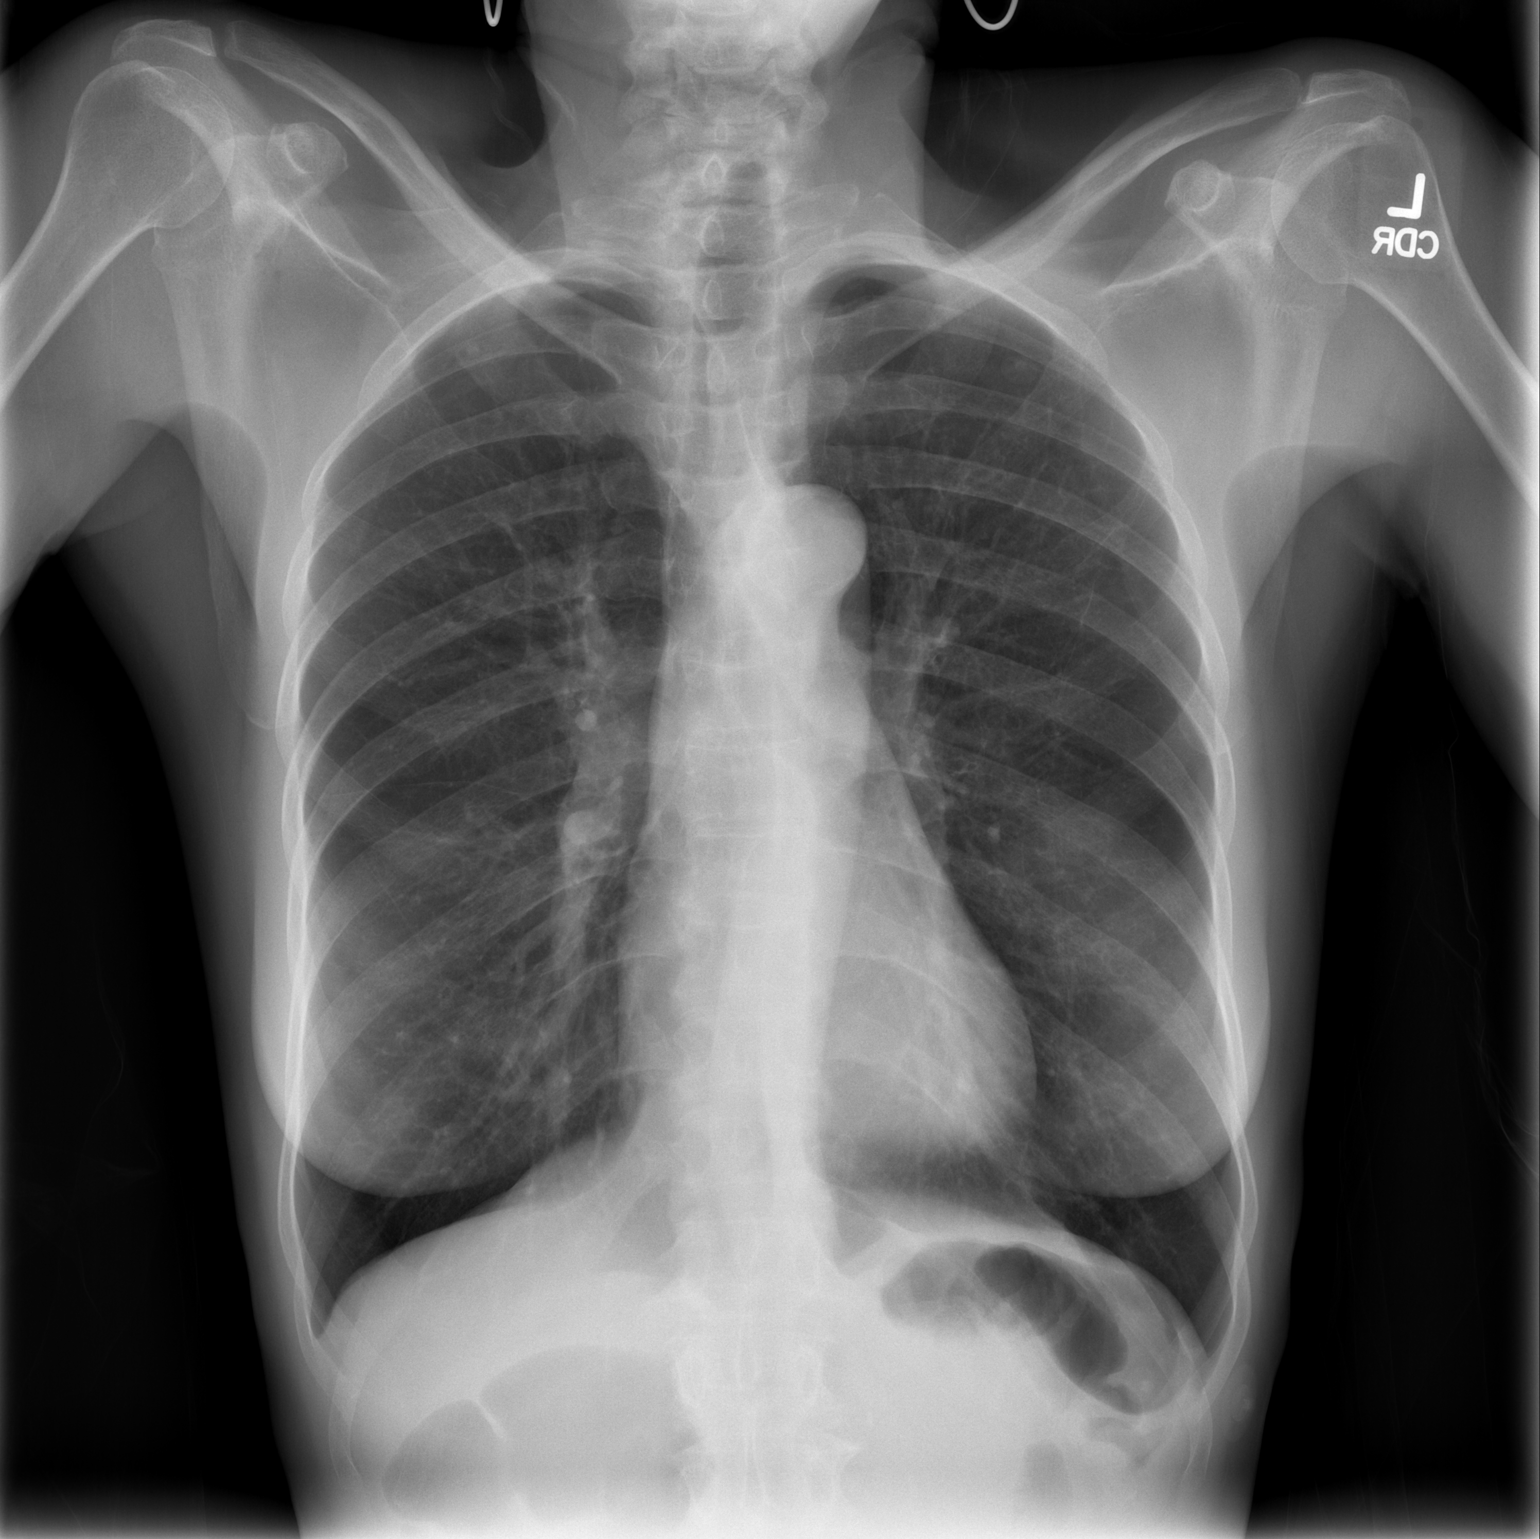

[w chest lat]
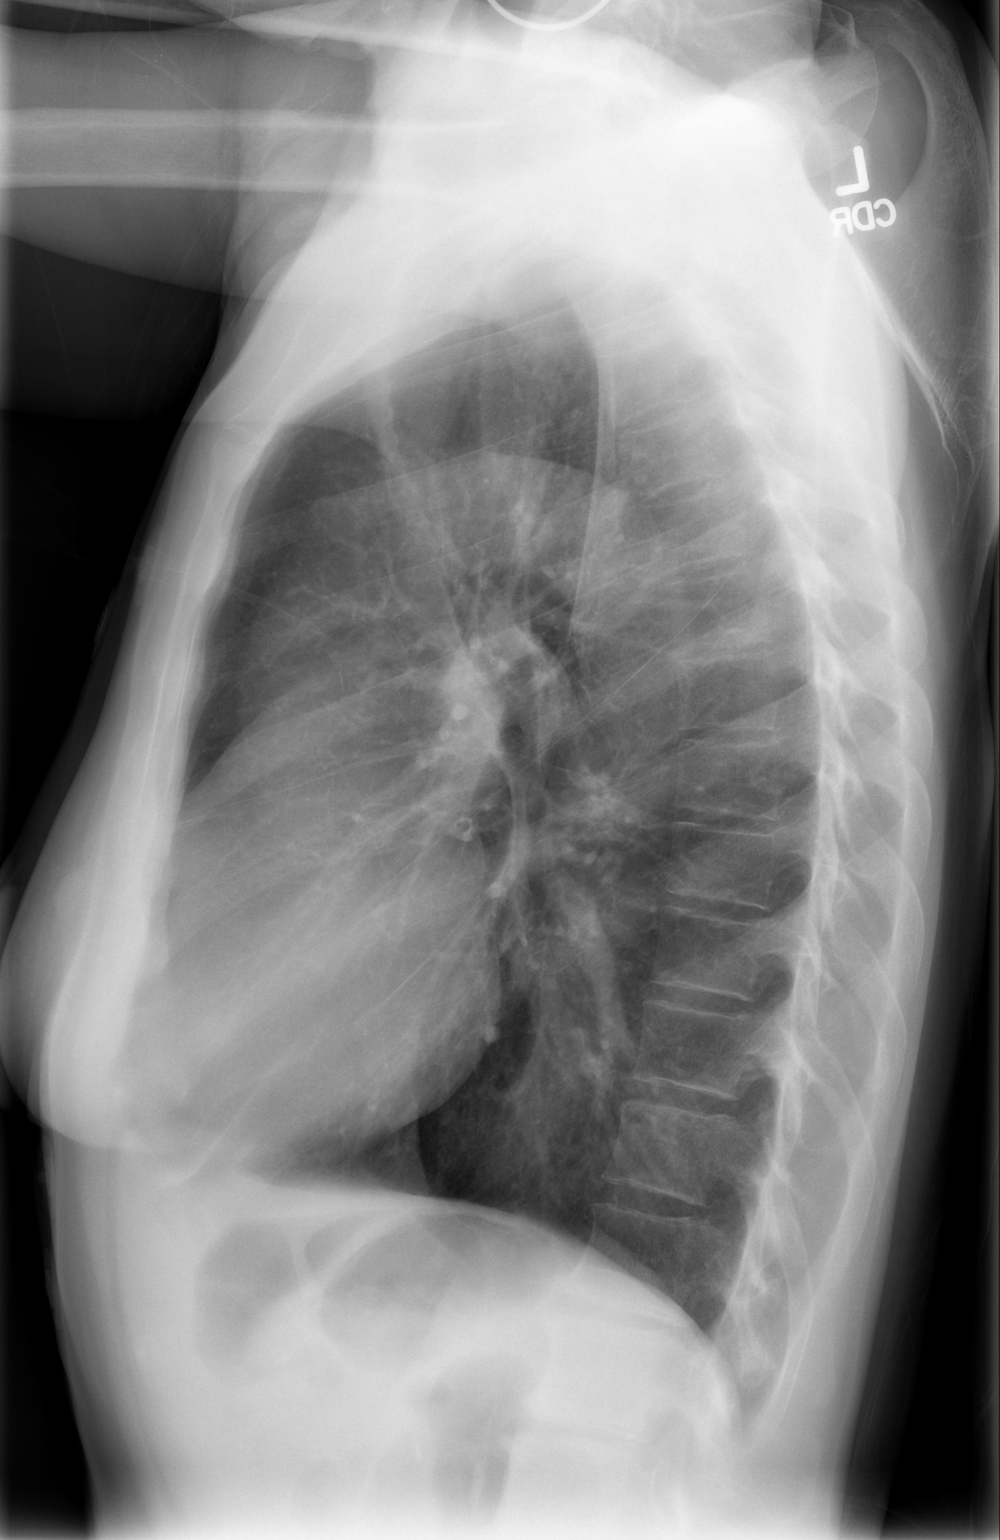

[2 of 2 positions shown; findings below may reference images not displayed]

FINDINGS: Cardiomediastinal silhouette is unchanged, minimally calcified
aortic knob. The lungs are clear without pleural effusions or focal
consolidations. Increased lung volumes with mildly flattened
hemidiaphragms, similar. Trachea projects midline and there is no
pneumothorax. Soft tissue planes and included osseous structures are
non-suspicious.
IMPRESSION: COPD without superimposed acute cardiopulmonary process.

  By: Boldrik Tl

## 2015-10-09 ENCOUNTER — Emergency Department (HOSPITAL_COMMUNITY): Payer: Medicare Other

## 2015-10-09 ENCOUNTER — Emergency Department (HOSPITAL_COMMUNITY)
Admission: EM | Admit: 2015-10-09 | Discharge: 2015-10-09 | Disposition: A | Discharge status: Home / Self Care | Payer: Medicare Other | Attending: Emergency Medicine | Admitting: Emergency Medicine

## 2015-10-09 ENCOUNTER — Encounter (HOSPITAL_COMMUNITY): Payer: Self-pay

## 2015-10-09 DIAGNOSIS — J984 Other disorders of lung: Secondary | ICD-10-CM | POA: Diagnosis not present

## 2015-10-09 DIAGNOSIS — R1013 Epigastric pain: Secondary | ICD-10-CM | POA: Insufficient documentation

## 2015-10-09 DIAGNOSIS — Z79899 Other long term (current) drug therapy: Secondary | ICD-10-CM | POA: Diagnosis not present

## 2015-10-09 DIAGNOSIS — Z7951 Long term (current) use of inhaled steroids: Secondary | ICD-10-CM | POA: Diagnosis not present

## 2015-10-09 DIAGNOSIS — Z87891 Personal history of nicotine dependence: Secondary | ICD-10-CM | POA: Insufficient documentation

## 2015-10-09 DIAGNOSIS — G43909 Migraine, unspecified, not intractable, without status migrainosus: Secondary | ICD-10-CM | POA: Diagnosis not present

## 2015-10-09 DIAGNOSIS — J45909 Unspecified asthma, uncomplicated: Secondary | ICD-10-CM | POA: Diagnosis not present

## 2015-10-09 DIAGNOSIS — Z7952 Long term (current) use of systemic steroids: Secondary | ICD-10-CM | POA: Insufficient documentation

## 2015-10-09 DIAGNOSIS — I4891 Unspecified atrial fibrillation: Secondary | ICD-10-CM | POA: Diagnosis not present

## 2015-10-09 DIAGNOSIS — R0789 Other chest pain: Secondary | ICD-10-CM | POA: Insufficient documentation

## 2015-10-09 DIAGNOSIS — I10 Essential (primary) hypertension: Secondary | ICD-10-CM | POA: Diagnosis not present

## 2015-10-09 DIAGNOSIS — Z7901 Long term (current) use of anticoagulants: Secondary | ICD-10-CM | POA: Insufficient documentation

## 2015-10-09 DIAGNOSIS — Z8719 Personal history of other diseases of the digestive system: Secondary | ICD-10-CM | POA: Diagnosis not present

## 2015-10-09 DIAGNOSIS — Z8659 Personal history of other mental and behavioral disorders: Secondary | ICD-10-CM | POA: Insufficient documentation

## 2015-10-09 DIAGNOSIS — R079 Chest pain, unspecified: Secondary | ICD-10-CM | POA: Diagnosis present

## 2015-10-09 LAB — LIPASE, BLOOD: LIPASE: 27 U/L (ref 11–51)

## 2015-10-09 LAB — URINALYSIS, ROUTINE W REFLEX MICROSCOPIC
BILIRUBIN URINE: NEGATIVE
Glucose, UA: NEGATIVE mg/dL
Ketones, ur: NEGATIVE mg/dL
Leukocytes, UA: NEGATIVE
NITRITE: NEGATIVE
PROTEIN: NEGATIVE mg/dL
SPECIFIC GRAVITY, URINE: 1.021 (ref 1.005–1.030)
pH: 7 (ref 5.0–8.0)

## 2015-10-09 LAB — COMPREHENSIVE METABOLIC PANEL
ALT: 26 U/L (ref 14–54)
ANION GAP: 11 (ref 5–15)
AST: 25 U/L (ref 15–41)
Albumin: 4.1 g/dL (ref 3.5–5.0)
Alkaline Phosphatase: 74 U/L (ref 38–126)
BILIRUBIN TOTAL: 0.3 mg/dL (ref 0.3–1.2)
BUN: 14 mg/dL (ref 6–20)
CO2: 25 mmol/L (ref 22–32)
Calcium: 10.3 mg/dL (ref 8.9–10.3)
Chloride: 107 mmol/L (ref 101–111)
Creatinine, Ser: 0.82 mg/dL (ref 0.44–1.00)
GFR calc Af Amer: >60 mL/min (ref >60)
GFR calc non Af Amer: >60 mL/min (ref >60)
GLUCOSE: 98 mg/dL (ref 65–99)
POTASSIUM: 3.4 mmol/L — AB (ref 3.5–5.1)
SODIUM: 143 mmol/L (ref 135–145)
TOTAL PROTEIN: 7.7 g/dL (ref 6.5–8.1)

## 2015-10-09 LAB — URINE MICROSCOPIC-ADD ON
Bacteria, UA: NONE SEEN
WBC UA: NONE SEEN WBC/hpf (ref 0–5)

## 2015-10-09 LAB — CBC
HCT: 41.8 % (ref 36.0–46.0)
Hemoglobin: 14 g/dL (ref 12.0–15.0)
MCH: 27.3 pg (ref 26.0–34.0)
MCHC: 33.5 g/dL (ref 30.0–36.0)
MCV: 81.6 fL (ref 78.0–100.0)
Platelets: 216 10*3/uL (ref 150–400)
RBC: 5.12 MIL/uL — ABNORMAL HIGH (ref 3.87–5.11)
RDW: 14.2 % (ref 11.5–15.5)
WBC: 3.5 10*3/uL — ABNORMAL LOW (ref 4.0–10.5)

## 2015-10-09 LAB — TROPONIN I: Troponin I: <0.03 ng/mL (ref <0.031)

## 2015-10-09 MED ORDER — GI COCKTAIL ~~LOC~~
30.0000 mL | Freq: Once | ORAL | Status: AC
  Administered 2015-10-09: 30 mL via ORAL
  Filled 2015-10-09: qty 30

## 2015-10-09 MED ORDER — PANTOPRAZOLE SODIUM 20 MG PO TBEC
20.0000 mg | DELAYED_RELEASE_TABLET | Freq: Every day | ORAL | Status: DC
  Administered 2015-10-09: 20 mg via ORAL
  Filled 2015-10-09: qty 1

## 2015-10-09 MED ORDER — ACETAMINOPHEN 325 MG PO TABS
650.0000 mg | ORAL_TABLET | Freq: Once | ORAL | Status: AC
  Administered 2015-10-09: 650 mg via ORAL
  Filled 2015-10-09: qty 2

## 2015-10-09 MED ORDER — PANTOPRAZOLE SODIUM 20 MG PO TBEC: 20.0000 mg | DELAYED_RELEASE_TABLET | Freq: Every day | ORAL | Status: DC

## 2015-10-09 NOTE — ED Notes (Addendum)
Pt presents with c/o chest pain starting 2 days ago with movement to stomach. Pt c/o continued cp at less Pain level.Pt also c/o pain at forehead and headache.

## 2015-10-09 NOTE — ED Notes (Signed)
Pt ambulated to the restroom with ease, no complaints

## 2015-10-09 NOTE — ED Notes (Signed)
MD at bedside. 

## 2015-10-09 NOTE — Discharge Instructions (Signed)
As discussed, your evaluation today has been largely reassuring.  But, it is important that you monitor your condition carefully, and do not hesitate to return to the ED if you develop new, or concerning changes in your condition. ? ?Otherwise, please follow-up with your physician for appropriate ongoing care. ? ?

## 2015-10-09 NOTE — ED Provider Notes (Signed)
CSN: [id number]     Arrival date & time 10/09/15  0701 History   First MD Initiated Contact with Patient 10/09/15 0703     Chief Complaint  Patient presents with  . Chest Pain     (Consider location/radiation/quality/duration/timing/severity/associated sxs/prior Treatment) HPI Patient presents with concern of epigastric pain. Pain began about one week ago. Since onset pain has been persistent, and has increased, now with pain from the epigastrium, radiating towards the right lateral chest wall, posterior. No associated dyspnea, upper chest pain, fever, chills. There is sensation of bloating. No relief with Maalox. Patient also has secondary concern of sinus fullness. No other headache, vision changes, difficult to breathing, speaking. Patient states that she is generally well, does not recall clear precipitant   Past Medical History  Diagnosis Date  . Asthma   . Hypertension   . GERD (gastroesophageal reflux disease)   . A-fib ([place])   . Migraine   . Depression    Past Surgical History  Procedure Laterality Date  . Tubal ligation     Family History  Problem Relation Age of Onset  . Cancer Mother   . Cancer Father   . Heart attack Mother 72   Social History  Substance Use Topics  . Smoking status: Former Smoker    Types: Cigarettes    Quit date: 11/07/2011  . Smokeless tobacco: Never Used  . Alcohol Use: No   OB History    No data available     Review of Systems  Constitutional:       Per HPI, otherwise negative  HENT:       Per HPI, otherwise negative  Respiratory:       Per HPI, otherwise negative  Cardiovascular:       Per HPI, otherwise negative  Gastrointestinal: Negative for vomiting.  Endocrine:       Negative aside from HPI  Genitourinary:       Neg aside from HPI   Musculoskeletal:       Per HPI, otherwise negative  Skin: Negative.   Neurological: Negative for syncope.      Allergies  Pantoprazole and Celecoxib  Home Medications    Prior to Admission medications   Medication Sig Start Date End Date Taking? Authorizing Provider  albuterol (PROVENTIL) (2.5 MG/3ML) 0.083% nebulizer solution Take 3 mLs (2.5 mg total) by nebulization every 6 (six) hours as needed for wheezing or shortness of breath. 07/07/14   Quintella Reichert, MD  diltiazem (CARDIZEM CD) 240 MG 24 hr capsule Take 1 capsule (240 mg total) by mouth daily. 07/01/15   Brett Canales, PA-C  feeding supplement, ENSURE ENLIVE, (ENSURE ENLIVE) LIQD Take 237 mLs by mouth 2 (two) times daily between meals. 06/12/15   Costin Karlyne Greenspan, MD  fluticasone (FLOVENT HFA) 110 MCG/ACT inhaler Inhale 1 puff into the lungs 2 (two) times daily. 06/07/14   Cherene Altes, MD  Fluticasone-Salmeterol (ADVAIR DISKUS) 250-50 MCG/DOSE AEPB Inhale 1 puff into the lungs 2 (two) times daily.    Historical Provider, MD  hydrochlorothiazide (HYDRODIURIL) 25 MG tablet Take 25 mg by mouth daily. 05/13/15   Historical Provider, MD  hydrocortisone cream 1 % Apply to affected area 3 times daily 08/14/14   Al Corpus, PA-C  hydrOXYzine (ATARAX/VISTARIL) 25 MG tablet Take 1 tablet (25 mg total) by mouth every 6 (six) hours. 06/01/15   [name], DO  lisinopril (PRINIVIL,ZESTRIL) 20 MG tablet Take 20 mg by mouth daily.    Historical Provider, MD  loratadine (CLARITIN) 10 MG tablet Take 1 tablet (10 mg total) by mouth daily. 05/13/15   Okey Regal, PA-C  metoCLOPramide (REGLAN) 5 MG tablet Take 5 mg by mouth 4 (four) times daily.    Historical Provider, MD  potassium chloride SA (K-DUR,KLOR-CON) 20 MEQ tablet Take 20 mEq by mouth daily. 05/01/15   Historical Provider, MD  rivaroxaban (XARELTO) 20 MG TABS tablet Take 1 tablet (20 mg total) by mouth daily with supper. 06/10/15   Nita Sells, MD  tiotropium (SPIRIVA HANDIHALER) 18 MCG inhalation capsule Place 1 capsule (18 mcg total) into inhaler and inhale daily. 06/22/13   Lacretia Leigh, MD  triamcinolone (KENALOG) 0.025 % ointment Apply 1  application topically 2 (two) times daily. 05/18/15   Hanna Patel-Mills, PA-C   BP 146/84 mmHg  Pulse 67  Temp(Src) 98.7 F (37.1 C) (Oral)  Resp 18  Ht 5\' 7"  (1.702 m)  Wt 121 lb (54.885 kg)  BMI 18.95 kg/m2  SpO2 100% Physical Exam  Constitutional: She is oriented to person, place, and time. She appears well-developed and well-nourished. No distress.  HENT:  Head: Normocephalic and atraumatic.  Eyes: Conjunctivae and EOM are normal.  Cardiovascular: Normal rate and regular rhythm.   Pulmonary/Chest: Effort normal and breath sounds normal. No stridor. No respiratory distress.  Abdominal: She exhibits no distension. There is no tenderness.  Musculoskeletal: She exhibits no edema.  Neurological: She is alert and oriented to person, place, and time. No cranial nerve deficit.  Skin: Skin is warm and dry.  Psychiatric: She has a normal mood and affect.  Nursing note and vitals reviewed.   ED Course  Procedures (including critical care time) Labs Review Labs Reviewed  CBC - Abnormal; Notable for the following:    WBC 3.5 (*)    RBC 5.12 (*)    All other components within normal limits  COMPREHENSIVE METABOLIC PANEL - Abnormal; Notable for the following:    Potassium 3.4 (*)    All other components within normal limits  URINALYSIS, ROUTINE W REFLEX MICROSCOPIC (NOT AT [facility]) - Abnormal; Notable for the following:    APPearance CLOUDY (*)    Hgb urine dipstick TRACE (*)    All other components within normal limits  URINE MICROSCOPIC-ADD ON - Abnormal; Notable for the following:    Squamous Epithelial / LPF 0-5 (*)    All other components within normal limits  TROPONIN I  LIPASE, BLOOD    Imaging Review Dg Chest 2 View  10/09/2015  CLINICAL DATA:  Epigastric pain. EXAM: CHEST  2 VIEW COMPARISON:  July 01, 2015 FINDINGS: No pneumothorax. The cardiomediastinal silhouette is stable. No pulmonary nodules, masses, or infiltrates. Again noted is hyperinflation of the lungs  suggesting emphysema or COPD. No other acute interval changes. IMPRESSION: Hyperinflation of the lungs is stable. No other acute abnormalities. Electronically Signed   By: Dorise Bullion III M.D   On: 10/09/2015 07:59   I have personally reviewed and evaluated these images and lab results as part of my medical decision-making.   EKG Interpretation   Date/Time:  Thursday October 09 2015 07:08:35 EST Ventricular Rate:  65 PR Interval:  112 QRS Duration: 91 QT Interval:  418 QTC Calculation: 435 R Axis:   70 Text Interpretation:  Sinus rhythm Borderline short PR interval RSR' in V1  or V2, probably normal variant Sinus rhythm Artifact RSR prime similar to  04/2003 Borderline ECG Confirmed by Carmin Muskrat  MD [phone]) on  10/09/2015 7:15:30 AM  Cardiac 70 sinus normal Pulse oximetry 100% room air normal Patient is a former smoker.  10:58 AM Patient in no distress. She has been ambulatory, no new complaints, beyond some neck soreness.  MDM  Presents with concern of ongoing epigastric discomfort several days. Patient has mild hypertension, but evaluation is reassuring, with no evidence for ongoing coronary ischemia, hypertensive crisis, and organ effects. No evidence for pneumonia, pulmonary embolism. Patient improved after initiation of GI cocktail, suggesting gastroesophageal etiology. Patient discharged in stable condition with similar medications to follow-up with primary care.  Notable, the patient is also on Xarelto, for atrial fibrillation, though this does not seem to be the case today.   Carmin Muskrat, MD 10/09/15 1059

## 2015-10-09 NOTE — ED Notes (Signed)
Pt verbalizes understanding of instructions. 

## 2015-10-23 IMAGING — CR DG CHEST 2V
2 series · 2 of 2 positions shown · non-contrast
Comparison: 06/06/2014

CLINICAL DATA: Asthma and shortness of breath

EXAM:
CHEST  2 VIEW

[w chest pa]
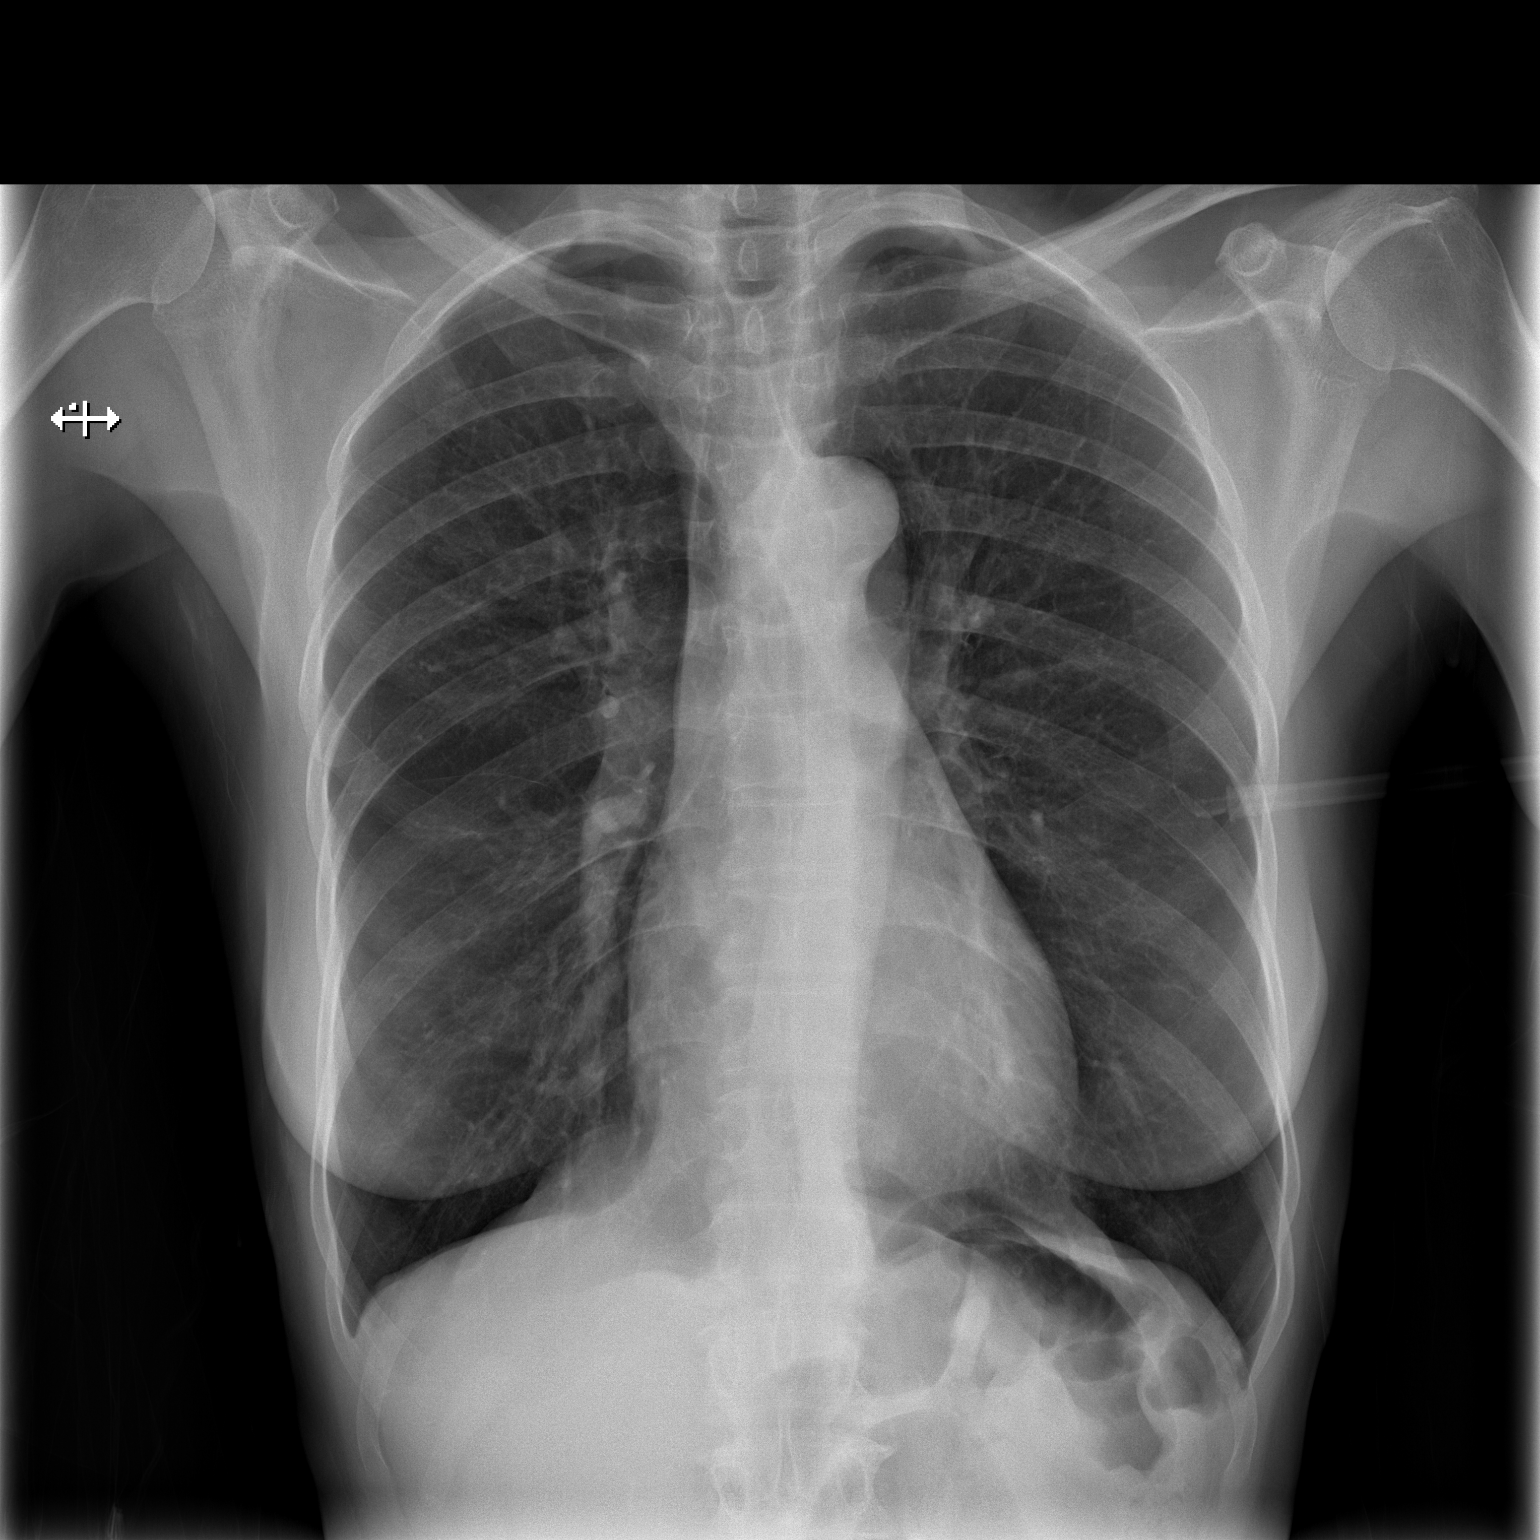

[w chest lat]
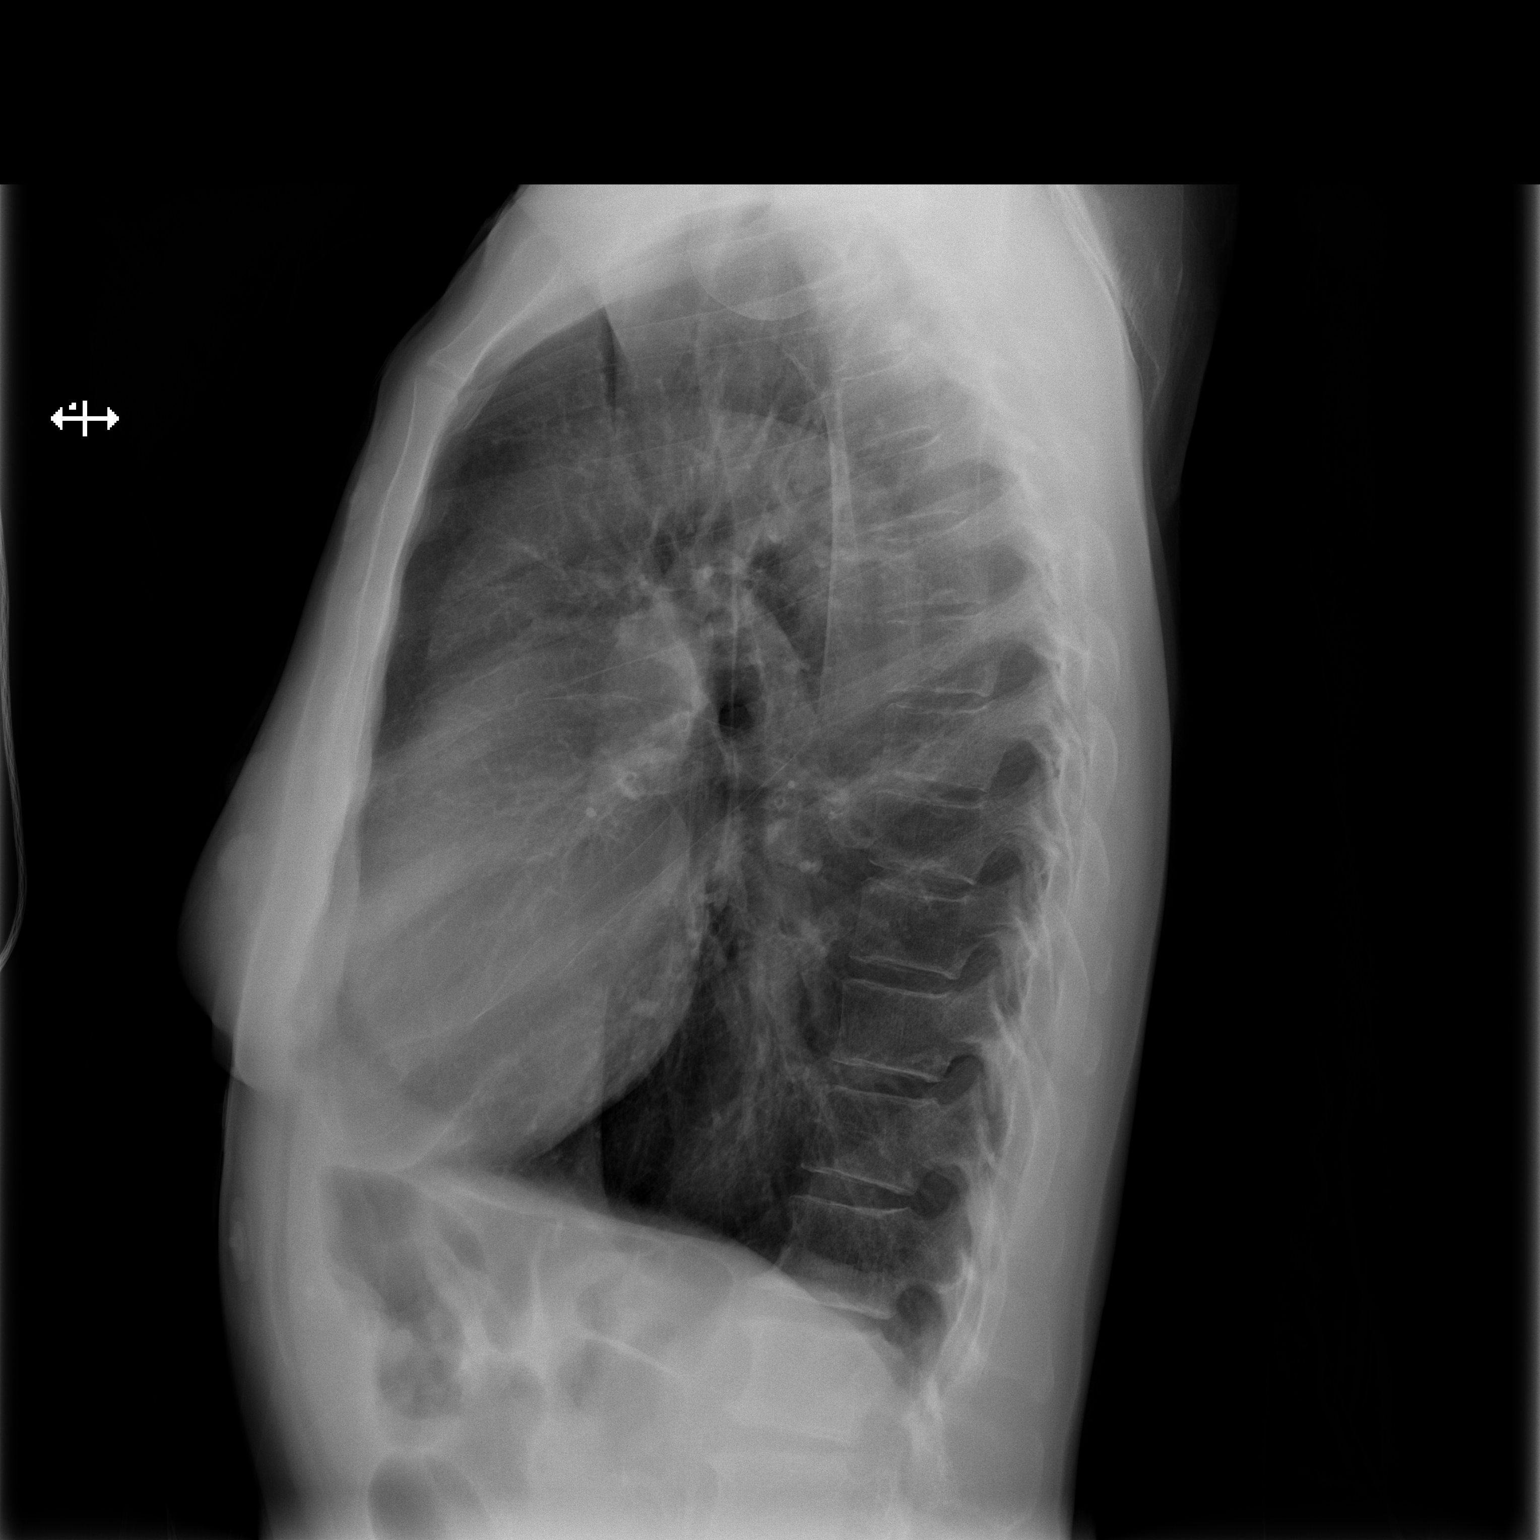

[2 of 2 positions shown; findings below may reference images not displayed]

FINDINGS: Both lungs are hyperinflated and there is flattening of the
hemidiaphragms. Heart size is within normal limits and stable.
Mediastinal and hilar contours are normal.

Lungs are clear. Negative for airspace disease, pleural effusion, or
pneumothorax.

No acute osseous abnormality.
IMPRESSION: 1.   Hyperinflation of the lungs.

2.    The lungs are clear.

## 2015-11-24 ENCOUNTER — Emergency Department (HOSPITAL_COMMUNITY)
Admission: EM | Admit: 2015-11-24 | Discharge: 2015-11-24 | Disposition: A | Discharge status: Home / Self Care | Payer: Medicare Other | Source: Home / Self Care | Attending: Emergency Medicine | Admitting: Emergency Medicine

## 2015-11-24 ENCOUNTER — Encounter (HOSPITAL_COMMUNITY): Payer: Self-pay | Admitting: Nurse Practitioner

## 2015-11-24 DIAGNOSIS — I4891 Unspecified atrial fibrillation: Secondary | ICD-10-CM | POA: Diagnosis not present

## 2015-11-24 DIAGNOSIS — Z8659 Personal history of other mental and behavioral disorders: Secondary | ICD-10-CM

## 2015-11-24 DIAGNOSIS — K219 Gastro-esophageal reflux disease without esophagitis: Secondary | ICD-10-CM

## 2015-11-24 DIAGNOSIS — I10 Essential (primary) hypertension: Secondary | ICD-10-CM | POA: Insufficient documentation

## 2015-11-24 DIAGNOSIS — Z7951 Long term (current) use of inhaled steroids: Secondary | ICD-10-CM | POA: Insufficient documentation

## 2015-11-24 DIAGNOSIS — E876 Hypokalemia: Secondary | ICD-10-CM

## 2015-11-24 DIAGNOSIS — R51 Headache: Secondary | ICD-10-CM

## 2015-11-24 DIAGNOSIS — I480 Paroxysmal atrial fibrillation: Secondary | ICD-10-CM

## 2015-11-24 DIAGNOSIS — Z79899 Other long term (current) drug therapy: Secondary | ICD-10-CM | POA: Insufficient documentation

## 2015-11-24 DIAGNOSIS — J45909 Unspecified asthma, uncomplicated: Secondary | ICD-10-CM

## 2015-11-24 DIAGNOSIS — R1013 Epigastric pain: Secondary | ICD-10-CM

## 2015-11-24 DIAGNOSIS — R05 Cough: Secondary | ICD-10-CM | POA: Diagnosis not present

## 2015-11-24 DIAGNOSIS — Z87891 Personal history of nicotine dependence: Secondary | ICD-10-CM

## 2015-11-24 DIAGNOSIS — R000 Tachycardia, unspecified: Secondary | ICD-10-CM | POA: Diagnosis not present

## 2015-11-24 LAB — HEPATIC FUNCTION PANEL
ALK PHOS: 73 U/L (ref 38–126)
ALT: 20 U/L (ref 14–54)
AST: 24 U/L (ref 15–41)
Albumin: 4.2 g/dL (ref 3.5–5.0)
BILIRUBIN TOTAL: 0.7 mg/dL (ref 0.3–1.2)
Total Protein: 8.2 g/dL — ABNORMAL HIGH (ref 6.5–8.1)

## 2015-11-24 LAB — BASIC METABOLIC PANEL
ANION GAP: 13 (ref 5–15)
BUN: 9 mg/dL (ref 6–20)
CHLORIDE: 103 mmol/L (ref 101–111)
CO2: 22 mmol/L (ref 22–32)
Calcium: 10.1 mg/dL (ref 8.9–10.3)
Creatinine, Ser: 0.73 mg/dL (ref 0.44–1.00)
GFR calc Af Amer: >60 mL/min (ref >60)
GLUCOSE: 136 mg/dL — AB (ref 65–99)
POTASSIUM: 2.9 mmol/L — AB (ref 3.5–5.1)
Sodium: 138 mmol/L (ref 135–145)

## 2015-11-24 LAB — CBC
HEMATOCRIT: 41.2 % (ref 36.0–46.0)
HEMOGLOBIN: 13.7 g/dL (ref 12.0–15.0)
MCH: 26.2 pg (ref 26.0–34.0)
MCHC: 33.3 g/dL (ref 30.0–36.0)
MCV: 78.9 fL (ref 78.0–100.0)
PLATELETS: 240 10*3/uL (ref 150–400)
RBC: 5.22 MIL/uL — AB (ref 3.87–5.11)
RDW: 14.9 % (ref 11.5–15.5)
WBC: 4.2 10*3/uL (ref 4.0–10.5)

## 2015-11-24 LAB — I-STAT TROPONIN, ED: Troponin i, poc: 0.01 ng/mL (ref 0.00–0.08)

## 2015-11-24 LAB — LIPASE, BLOOD: Lipase: 23 U/L (ref 11–51)

## 2015-11-24 MED ORDER — DILTIAZEM HCL 100 MG IV SOLR
5.0000 mg/h | INTRAVENOUS | Status: DC
  Administered 2015-11-24: 5 mg/h via INTRAVENOUS
  Filled 2015-11-24: qty 100

## 2015-11-24 MED ORDER — TRAMADOL HCL 50 MG PO TABS
50.0000 mg | ORAL_TABLET | Freq: Once | ORAL | Status: AC
  Administered 2015-11-24: 50 mg via ORAL
  Filled 2015-11-24: qty 1

## 2015-11-24 MED ORDER — PANTOPRAZOLE SODIUM 40 MG IV SOLR
40.0000 mg | Freq: Once | INTRAVENOUS | Status: AC
  Administered 2015-11-24: 40 mg via INTRAVENOUS
  Filled 2015-11-24: qty 40

## 2015-11-24 MED ORDER — POTASSIUM CHLORIDE CRYS ER 20 MEQ PO TBCR: 20.0000 meq | EXTENDED_RELEASE_TABLET | Freq: Two times a day (BID) | ORAL | Status: DC

## 2015-11-24 MED ORDER — POTASSIUM CHLORIDE CRYS ER 20 MEQ PO TBCR
40.0000 meq | EXTENDED_RELEASE_TABLET | Freq: Once | ORAL | Status: AC
  Administered 2015-11-24: 40 meq via ORAL
  Filled 2015-11-24: qty 2

## 2015-11-24 MED ORDER — ACETAMINOPHEN 500 MG PO TABS
1000.0000 mg | ORAL_TABLET | Freq: Once | ORAL | Status: AC
  Administered 2015-11-24: 1000 mg via ORAL
  Filled 2015-11-24: qty 2

## 2015-11-24 MED ORDER — DILTIAZEM LOAD VIA INFUSION
20.0000 mg | Freq: Once | INTRAVENOUS | Status: AC
  Administered 2015-11-24: 20 mg via INTRAVENOUS
  Filled 2015-11-24: qty 20

## 2015-11-24 MED ORDER — METOPROLOL TARTRATE 1 MG/ML IV SOLN
5.0000 mg | Freq: Once | INTRAVENOUS | Status: AC
  Administered 2015-11-24: 5 mg via INTRAVENOUS
  Filled 2015-11-24: qty 5

## 2015-11-24 NOTE — Discharge Instructions (Signed)
It was our pleasure to provide your ER care today - we hope that you feel better.  Rest. Drink adequate fluids.  From today's lab tests, your potassium level is low (2.9) - eat plenty of fruits and vegetables, take one extra of your potassium tablets each day for the next week.   For afib, follow up with your cardiologist in coming week - call office tomorrow to arrange appointment.   Return to ER if worse, new symptoms, trouble breathing, chest pain, fevers, weak/faint, persistent fast heart beat, other concern.  You were given pain medication in the ER - no driving for the next 4 hours.    Atrial Fibrillation Atrial fibrillation is a type of irregular or rapid heartbeat (arrhythmia). In atrial fibrillation, the heart quivers continuously in a chaotic pattern. This occurs when parts of the heart receive disorganized signals that make the heart unable to pump blood normally. This can increase the risk for stroke, heart failure, and other heart-related conditions. There are different types of atrial fibrillation, including:  Paroxysmal atrial fibrillation. This type starts suddenly, and it usually stops on its own shortly after it starts.  Persistent atrial fibrillation. This type often lasts longer than a week. It may stop on its own or with treatment.  Long-lasting persistent atrial fibrillation. This type lasts longer than 12 months.  Permanent atrial fibrillation. This type does not go away. Talk with your health care provider to learn about the type of atrial fibrillation that you have. CAUSES This condition is caused by some heart-related conditions or procedures, including:  A heart attack.  Coronary artery disease.  Heart failure.  Heart valve conditions.  High blood pressure.  Inflammation of the sac that surrounds the heart (pericarditis).  Heart surgery.  Certain heart rhythm disorders, such as Wolf-Parkinson-White syndrome. Other causes  include:  Pneumonia.  Obstructive sleep apnea.  Blockage of an artery in the lungs (pulmonary embolism, or PE).  Lung cancer.  Chronic lung disease.  Thyroid problems, especially if the thyroid is overactive (hyperthyroidism).  Caffeine.  Excessive alcohol use or illegal drug use.  Use of some medicines, including certain decongestants and diet pills. Sometimes, the cause cannot be found. RISK FACTORS This condition is more likely to develop in:  People who are older in age.  People who smoke.  People who have diabetes mellitus.  People who are overweight (obese).  Athletes who exercise vigorously. SYMPTOMS Symptoms of this condition include:  A feeling that your heart is beating rapidly or irregularly.  A feeling of discomfort or pain in your chest.  Shortness of breath.  Sudden light-headedness or weakness.  Getting tired easily during exercise. In some cases, there are no symptoms. DIAGNOSIS Your health care provider may be able to detect atrial fibrillation when taking your pulse. If detected, this condition may be diagnosed with:  An electrocardiogram (ECG).  A Holter monitor test that records your heartbeat patterns over a 24-hour period.  Transthoracic echocardiogram (TTE) to evaluate how blood flows through your heart.  Transesophageal echocardiogram (TEE) to view more detailed images of your heart.  A stress test.  Imaging tests, such as a CT scan or chest X-ray.  Blood tests. TREATMENT The main goals of treatment are to prevent blood clots from forming and to keep your heart beating at a normal rate and rhythm. The type of treatment that you receive depends on many factors, such as your underlying medical conditions and how you feel when you are experiencing atrial fibrillation. This condition  may be treated with:  Medicine to slow down the heart rate, bring the heart's rhythm back to normal, or prevent clots from forming.  Electrical  cardioversion. This is a procedure that resets your heart's rhythm by delivering a controlled, low-energy shock to the heart through your skin.  Different types of ablation, such as catheter ablation, catheter ablation with pacemaker, or surgical ablation. These procedures destroy the heart tissues that send abnormal signals. When the pacemaker is used, it is placed under your skin to help your heart beat in a regular rhythm. HOME CARE INSTRUCTIONS  Take over-the counter and prescription medicines only as told by your health care provider.  If your health care provider prescribed a blood-thinning medicine (anticoagulant), take it exactly as told. Taking too much blood-thinning medicine can cause bleeding. If you do not take enough blood-thinning medicine, you will not have the protection that you need against stroke and other problems.  Do not use tobacco products, including cigarettes, chewing tobacco, and e-cigarettes. If you need help quitting, ask your health care provider.  If you have obstructive sleep apnea, manage your condition as told by your health care provider.  Do not drink alcohol.  Do not drink beverages that contain caffeine, such as coffee, soda, and tea.  Maintain a healthy weight. Do not use diet pills unless your health care provider approves. Diet pills may make heart problems worse.  Follow diet instructions as told by your health care provider.  Exercise regularly as told by your health care provider.  Keep all follow-up visits as told by your health care provider. This is important. PREVENTION  Avoid drinking beverages that contain caffeine or alcohol.  Avoid certain medicines, especially medicines that are used for breathing problems.  Avoid certain herbs and herbal medicines, such as those that contain ephedra or ginseng.  Do not use illegal drugs, such as cocaine and amphetamines.  Do not smoke.  Manage your high blood pressure. SEEK MEDICAL CARE  IF:  You notice a change in the rate, rhythm, or strength of your heartbeat.  You are taking an anticoagulant and you notice increased bruising.  You tire more easily when you exercise or exert yourself. SEEK IMMEDIATE MEDICAL CARE IF:  You have chest pain, abdominal pain, sweating, or weakness.  You feel nauseous.  You notice blood in your vomit, bowel movement, or urine.  You have shortness of breath.  You suddenly have swollen feet and ankles.  You feel dizzy.  You have sudden weakness or numbness of the face, arm, or leg, especially on one side of the body.  You have trouble speaking, trouble understanding, or both (aphasia).  Your face or your eyelid droops on one side. These symptoms may represent a serious problem that is an emergency. Do not wait to see if the symptoms will go away. Get medical help right away. Call your local emergency services (911 in the [place] Do not drive yourself to the hospital.   This information is not intended to replace advice given to you by your health care provider. Make sure you discuss any questions you have with your health care provider.   Document Released: 08/09/2005 Document Revised: 04/30/2015 Document Reviewed: 12/04/2014 Elsevier Interactive Patient Education 2016 [employer].    Hypokalemia Hypokalemia means that the amount of potassium in the blood is lower than normal.Potassium is a chemical, called an electrolyte, that helps regulate the amount of fluid in the body. It also stimulates muscle contraction and helps nerves function properly.Most of  the body's potassium is inside of cells, and only a very small amount is in the blood. Because the amount in the blood is so small, minor changes can be life-threatening. CAUSES  Antibiotics.  Diarrhea or vomiting.  Using laxatives too much, which can cause diarrhea.  Chronic kidney disease.  Water pills (diuretics).  Eating disorders (bulimia).  Low magnesium  level.  Sweating a lot. SIGNS AND SYMPTOMS  Weakness.  Constipation.  Fatigue.  Muscle cramps.  Mental confusion.  Skipped heartbeats or irregular heartbeat (palpitations).  Tingling or numbness. DIAGNOSIS  Your health care provider can diagnose hypokalemia with blood tests. In addition to checking your potassium level, your health care provider may also check other lab tests. TREATMENT Hypokalemia can be treated with potassium supplements taken by mouth or adjustments in your current medicines. If your potassium level is very low, you may need to get potassium through a vein (IV) and be monitored in the hospital. A diet high in potassium is also helpful. Foods high in potassium are:  Nuts, such as peanuts and pistachios.  Seeds, such as sunflower seeds and pumpkin seeds.  Peas, lentils, and lima beans.  Whole grain and bran cereals and breads.  Fresh fruit and vegetables, such as apricots, avocado, bananas, cantaloupe, kiwi, oranges, tomatoes, asparagus, and potatoes.  Orange and tomato juices.  Red meats.  Fruit yogurt. HOME CARE INSTRUCTIONS  Take all medicines as prescribed by your health care provider.  Maintain a healthy diet by including nutritious food, such as fruits, vegetables, nuts, whole grains, and lean meats.  If you are taking a laxative, be sure to follow the directions on the label. SEEK MEDICAL CARE IF:  Your weakness gets worse.  You feel your heart pounding or racing.  You are vomiting or having diarrhea.  You are diabetic and having trouble keeping your blood glucose in the normal range. SEEK IMMEDIATE MEDICAL CARE IF:  You have chest pain, shortness of breath, or dizziness.  You are vomiting or having diarrhea for more than 2 days.  You faint. MAKE SURE YOU:   Understand these instructions.  Will watch your condition.  Will get help right away if you are not doing well or get worse.   This information is not intended to  replace advice given to you by your health care provider. Make sure you discuss any questions you have with your health care provider.   Document Released: 08/09/2005 Document Revised: 08/30/2014 Document Reviewed: 02/09/2013 Elsevier Interactive Patient Education [employer].

## 2015-11-24 NOTE — ED Notes (Signed)
She c/o "burning" pain in chest and abdomen, nausea, headaches, weakness since this morning. Reports she tried to rest all day but symptoms persisted so she decided to seek medical treatment. She is A&ox4, resp e/u

## 2015-11-24 NOTE — ED Notes (Signed)
MD at bedside updating patient.

## 2015-11-24 NOTE — ED Notes (Signed)
MD at bedside. 

## 2015-11-24 NOTE — ED Notes (Signed)
Patient ambulatory and independent at discharge.

## 2015-11-24 NOTE — ED Provider Notes (Addendum)
CSN: [id number]     Arrival date & time 11/24/15  1717 History   First MD Initiated Contact with Patient 11/24/15 1739     Chief Complaint  Patient presents with  . Headache  . Weakness     (Consider location/radiation/quality/duration/timing/severity/associated sxs/prior Treatment) Patient is a 65 y.o. female presenting with chest pain, headaches, and weakness. The history is provided by the patient.  Chest Pain Associated symptoms: headache, nausea, palpitations and weakness   Associated symptoms: no abdominal pain, no back pain, no cough, no fever, no numbness, no shortness of breath and not vomiting   Headache Associated symptoms: nausea and weakness   Associated symptoms: no abdominal pain, no back pain, no cough, no diarrhea, no fever, no neck pain, no numbness, no sore throat and no vomiting   Weakness Associated symptoms include chest pain and headaches. Pertinent negatives include no abdominal pain and no shortness of breath.  Patient w hx afib, c/o onset burning epigastric pain earlier today, at rest. States hx gerd and felt similar. Then later she noticed palpitations. States felt same as prior afib. Is on xarelto.  Denies chest pain. Mild sob. No syncope. Nausea. No vomiting. Had gradual onset mild dull frontal headache earlier, persistent. No severe head pain. No neck pain. No back or flank pain. Denies fever or chills.      Past Medical History  Diagnosis Date  . Asthma   . Hypertension   . GERD (gastroesophageal reflux disease)   . A-fib ([place])   . Migraine   . Depression    Past Surgical History  Procedure Laterality Date  . Tubal ligation     Family History  Problem Relation Age of Onset  . Cancer Mother   . Cancer Father   . Heart attack Mother 75   Social History  Substance Use Topics  . Smoking status: Former Smoker    Types: Cigarettes    Quit date: 11/07/2011  . Smokeless tobacco: Never Used  . Alcohol Use: No   OB History    No data available      Review of Systems  Constitutional: Negative for fever and chills.  HENT: Negative for sore throat.   Eyes: Negative for redness.  Respiratory: Negative for cough and shortness of breath.   Cardiovascular: Positive for chest pain and palpitations. Negative for leg swelling.  Gastrointestinal: Positive for nausea. Negative for vomiting, abdominal pain and diarrhea.  Genitourinary: Negative for dysuria and flank pain.  Musculoskeletal: Negative for back pain and neck pain.  Skin: Negative for rash.  Neurological: Positive for weakness and headaches. Negative for numbness.  Hematological: Does not bruise/bleed easily.  Psychiatric/Behavioral: Negative for confusion.      Allergies  Celecoxib  Home Medications   Prior to Admission medications   Medication Sig Start Date End Date Taking? Authorizing Provider  albuterol (PROVENTIL) (2.5 MG/3ML) 0.083% nebulizer solution Take 3 mLs (2.5 mg total) by nebulization every 6 (six) hours as needed for wheezing or shortness of breath. 07/07/14   Quintella Reichert, MD  diltiazem (CARDIZEM CD) 240 MG 24 hr capsule Take 1 capsule (240 mg total) by mouth daily. 07/01/15   Brett Canales, PA-C  feeding supplement, ENSURE ENLIVE, (ENSURE ENLIVE) LIQD Take 237 mLs by mouth 2 (two) times daily between meals. Patient not taking: Reported on 10/09/2015 06/12/15   Caren Griffins, MD  Fluticasone-Salmeterol (ADVAIR DISKUS) 250-50 MCG/DOSE AEPB Inhale 1 puff into the lungs 2 (two) times daily.    Historical Provider, MD  hydrochlorothiazide (HYDRODIURIL) 25 MG tablet Take 25 mg by mouth daily. 05/13/15   Historical Provider, MD  hydrocortisone cream 1 % Apply to affected area 3 times daily Patient not taking: Reported on 10/09/2015 08/14/14   Al Corpus, PA-C  hydrOXYzine (ATARAX/VISTARIL) 25 MG tablet Take 1 tablet (25 mg total) by mouth every 6 (six) hours. Patient not taking: Reported on 10/09/2015 06/01/15   [name], DO  loratadine (CLARITIN) 10 MG  tablet Take 1 tablet (10 mg total) by mouth daily. Patient not taking: Reported on 10/09/2015 05/13/15   Okey Regal, PA-C  pantoprazole (PROTONIX) 20 MG tablet Take 1 tablet (20 mg total) by mouth daily. 10/09/15   Carmin Muskrat, MD  potassium chloride SA (K-DUR,KLOR-CON) 20 MEQ tablet Take 20 mEq by mouth daily. 05/01/15   Historical Provider, MD  rivaroxaban (XARELTO) 20 MG TABS tablet Take 1 tablet (20 mg total) by mouth daily with supper. 06/10/15   Nita Sells, MD  tiotropium (SPIRIVA HANDIHALER) 18 MCG inhalation capsule Place 1 capsule (18 mcg total) into inhaler and inhale daily. 06/22/13   Lacretia Leigh, MD  triamcinolone (KENALOG) 0.025 % ointment Apply 1 application topically 2 (two) times daily. Patient not taking: Reported on 10/09/2015 05/18/15   Hanna Patel-Mills, PA-C   BP 118/95 mmHg  Pulse 160  Temp(Src) 98.2 F (36.8 C) (Oral)  Resp 17  SpO2 98% Physical Exam  Constitutional: She is oriented to person, place, and time. She appears well-developed and well-nourished.  Tachycardic.   HENT:  Head: Atraumatic.  Mouth/Throat: Oropharynx is clear and moist.  No sinus or temporal tenderness.   Eyes: Conjunctivae are normal. Pupils are equal, round, and reactive to light. No scleral icterus.  Neck: Neck supple. No tracheal deviation present. No thyromegaly present.  No stiffness or rigidity  Cardiovascular: Normal heart sounds and intact distal pulses.   No murmur heard. Tachycardic, irregular.   Pulmonary/Chest: Effort normal. No respiratory distress.  Abdominal: Soft. Normal appearance and bowel sounds are normal. She exhibits no distension. There is no tenderness.  Genitourinary:  No cva tenderness.   Musculoskeletal: She exhibits no edema or tenderness.  Neurological: She is alert and oriented to person, place, and time. No cranial nerve deficit.  Motor intact bil, stre 5/5. Steady gait.   Skin: Skin is warm and dry. No rash noted.  Psychiatric: She has a  normal mood and affect.  Nursing note and vitals reviewed.   ED Course  Procedures (including critical care time) Labs Review   Results for orders placed or performed during the hospital encounter of [id number]  Basic metabolic panel  Result Value Ref Range   Sodium 138 135 - 145 mmol/L   Potassium 2.9 (L) 3.5 - 5.1 mmol/L   Chloride 103 101 - 111 mmol/L   CO2 22 22 - 32 mmol/L   Glucose, Bld 136 (H) 65 - 99 mg/dL   BUN 9 6 - 20 mg/dL   Creatinine, Ser 0.73 0.44 - 1.00 mg/dL   Calcium 10.1 8.9 - 10.3 mg/dL   GFR calc non Af Amer >60 >60 mL/min   GFR calc Af Amer >60 >60 mL/min   Anion gap 13 5 - 15  CBC  Result Value Ref Range   WBC 4.2 4.0 - 10.5 K/uL   RBC 5.22 (H) 3.87 - 5.11 MIL/uL   Hemoglobin 13.7 12.0 - 15.0 g/dL   HCT 41.2 36.0 - 46.0 %   MCV 78.9 78.0 - 100.0 fL   MCH 26.2 26.0 - 34.0  pg   MCHC 33.3 30.0 - 36.0 g/dL   RDW 14.9 11.5 - 15.5 %   Platelets 240 150 - 400 K/uL  Hepatic function panel  Result Value Ref Range   Total Protein 8.2 (H) 6.5 - 8.1 g/dL   Albumin 4.2 3.5 - 5.0 g/dL   AST 24 15 - 41 U/L   ALT 20 14 - 54 U/L   Alkaline Phosphatase 73 38 - 126 U/L   Total Bilirubin 0.7 0.3 - 1.2 mg/dL   Bilirubin, Direct <0.1 (L) 0.1 - 0.5 mg/dL   Indirect Bilirubin NOT CALCULATED 0.3 - 0.9 mg/dL  Lipase, blood  Result Value Ref Range   Lipase 23 11 - 51 U/L  I-stat troponin, ED (not at [facility], [facility])  Result Value Ref Range   Troponin i, poc 0.01 0.00 - 0.08 ng/mL   Comment 3              I have personally reviewed and evaluated these images and lab results as part of my medical decision-making.   EKG Interpretation   Date/Time:  Monday November 24 2015 17:29:04 EDT Ventricular Rate:  158 PR Interval:    QRS Duration: 76 QT Interval:  284 QTC Calculation: 460 R Axis:   69 Text Interpretation:  Atrial fibrillation with rapid ventricular response  with premature ventricular or aberrantly conducted complexes Nonspecific  ST abnormality Confirmed by  Ashok Cordia  MD, Lennette Bihari ([id number]) on 11/24/2015 5:37:43  PM      MDM   Iv ns. Continuous pulse ox and monitor. o2 [place]. Labs.  Reviewed nursing notes and prior charts for additional history.   Patient with afib with very rapid ventricular response.  Iv cardizem bolus and gtt.  k low, 2.9. kcl po.  Remains in afib. Pt on metoprolol at home. Will give dose in ED.  Metoprolol 5 mg iv.  Po fluids.  Recheck pt comfortable. Nad. No chest pain. No sob. No headache.   Filed Vitals:   11/24/15 1900 11/24/15 2000  BP: 116/97 120/80  Pulse: 79 62  Temp:    Resp: 24 20    Recheck, pt in NSR. Remains in nsr.  Pt currently asymptomatic, in nsr, and appears stable for d/c.  Pt indicates has adequate of her meds at home, including potassium.  Will have take one extra k tablet a day for the next week, pcp recheck in coming week.       Lajean Saver, MD 11/24/15 2030

## 2015-11-26 ENCOUNTER — Emergency Department (HOSPITAL_COMMUNITY): Payer: Medicare Other

## 2015-11-26 ENCOUNTER — Inpatient Hospital Stay (HOSPITAL_COMMUNITY)
Admission: EM | Admit: 2015-11-26 | Discharge: 2015-11-27 | DRG: 310 | Disposition: A | Discharge status: Home / Self Care | Payer: Medicare Other | Attending: Internal Medicine | Admitting: Internal Medicine

## 2015-11-26 ENCOUNTER — Encounter (HOSPITAL_COMMUNITY): Payer: Self-pay

## 2015-11-26 DIAGNOSIS — I10 Essential (primary) hypertension: Secondary | ICD-10-CM | POA: Diagnosis not present

## 2015-11-26 DIAGNOSIS — F1290 Cannabis use, unspecified, uncomplicated: Secondary | ICD-10-CM | POA: Diagnosis present

## 2015-11-26 DIAGNOSIS — R079 Chest pain, unspecified: Secondary | ICD-10-CM | POA: Diagnosis not present

## 2015-11-26 DIAGNOSIS — R0789 Other chest pain: Secondary | ICD-10-CM | POA: Diagnosis present

## 2015-11-26 DIAGNOSIS — I480 Paroxysmal atrial fibrillation: Secondary | ICD-10-CM | POA: Diagnosis not present

## 2015-11-26 DIAGNOSIS — Z8679 Personal history of other diseases of the circulatory system: Secondary | ICD-10-CM

## 2015-11-26 DIAGNOSIS — J45909 Unspecified asthma, uncomplicated: Secondary | ICD-10-CM | POA: Diagnosis present

## 2015-11-26 DIAGNOSIS — K219 Gastro-esophageal reflux disease without esophagitis: Secondary | ICD-10-CM | POA: Diagnosis present

## 2015-11-26 DIAGNOSIS — K210 Gastro-esophageal reflux disease with esophagitis: Secondary | ICD-10-CM | POA: Diagnosis not present

## 2015-11-26 DIAGNOSIS — E876 Hypokalemia: Secondary | ICD-10-CM | POA: Diagnosis present

## 2015-11-26 DIAGNOSIS — I4891 Unspecified atrial fibrillation: Secondary | ICD-10-CM

## 2015-11-26 DIAGNOSIS — Z87891 Personal history of nicotine dependence: Secondary | ICD-10-CM | POA: Diagnosis not present

## 2015-11-26 DIAGNOSIS — F329 Major depressive disorder, single episode, unspecified: Secondary | ICD-10-CM | POA: Diagnosis present

## 2015-11-26 DIAGNOSIS — Z888 Allergy status to other drugs, medicaments and biological substances status: Secondary | ICD-10-CM | POA: Diagnosis not present

## 2015-11-26 DIAGNOSIS — Z7901 Long term (current) use of anticoagulants: Secondary | ICD-10-CM | POA: Diagnosis not present

## 2015-11-26 DIAGNOSIS — R05 Cough: Secondary | ICD-10-CM | POA: Diagnosis not present

## 2015-11-26 DIAGNOSIS — F1910 Other psychoactive substance abuse, uncomplicated: Secondary | ICD-10-CM

## 2015-11-26 HISTORY — DX: Long term (current) use of anticoagulants: Z79.01

## 2015-11-26 HISTORY — DX: Personal history of other diseases of the circulatory system: Z86.79

## 2015-11-26 LAB — URINALYSIS, ROUTINE W REFLEX MICROSCOPIC
Bilirubin Urine: NEGATIVE
Glucose, UA: NEGATIVE mg/dL
KETONES UR: NEGATIVE mg/dL
LEUKOCYTES UA: NEGATIVE
NITRITE: NEGATIVE
PROTEIN: NEGATIVE mg/dL
Specific Gravity, Urine: 1.006 (ref 1.005–1.030)
pH: 7.5 (ref 5.0–8.0)

## 2015-11-26 LAB — CBC WITH DIFFERENTIAL/PLATELET
Basophils Absolute: 0 10*3/uL (ref 0.0–0.1)
Basophils Relative: 1 %
EOS ABS: 0.2 10*3/uL (ref 0.0–0.7)
Eosinophils Relative: 4 %
HCT: 41 % (ref 36.0–46.0)
Hemoglobin: 14 g/dL (ref 12.0–15.0)
LYMPHS PCT: 42 %
Lymphs Abs: 1.9 10*3/uL (ref 0.7–4.0)
MCH: 27.3 pg (ref 26.0–34.0)
MCHC: 34.1 g/dL (ref 30.0–36.0)
MCV: 79.9 fL (ref 78.0–100.0)
MONO ABS: 0.5 10*3/uL (ref 0.1–1.0)
Monocytes Relative: 11 %
NEUTROS ABS: 1.9 10*3/uL (ref 1.7–7.7)
NEUTROS PCT: 42 %
PLATELETS: 210 10*3/uL (ref 150–400)
RBC: 5.13 MIL/uL — AB (ref 3.87–5.11)
RDW: 15.5 % (ref 11.5–15.5)
WBC: 4.4 10*3/uL (ref 4.0–10.5)

## 2015-11-26 LAB — I-STAT CHEM 8, ED
BUN: 12 mg/dL (ref 6–20)
CREATININE: 0.7 mg/dL (ref 0.44–1.00)
Calcium, Ion: 1.18 mmol/L (ref 1.13–1.30)
Chloride: 103 mmol/L (ref 101–111)
Glucose, Bld: 112 mg/dL — ABNORMAL HIGH (ref 65–99)
HCT: 45 % (ref 36.0–46.0)
HEMOGLOBIN: 15.3 g/dL — AB (ref 12.0–15.0)
Potassium: 3.5 mmol/L (ref 3.5–5.1)
SODIUM: 141 mmol/L (ref 135–145)
TCO2: 27 mmol/L (ref 0–100)

## 2015-11-26 LAB — URINE MICROSCOPIC-ADD ON: WBC UA: NONE SEEN WBC/hpf (ref 0–5)

## 2015-11-26 LAB — RAPID URINE DRUG SCREEN, HOSP PERFORMED
Amphetamines: NOT DETECTED
Barbiturates: NOT DETECTED
Benzodiazepines: NOT DETECTED
COCAINE: NOT DETECTED
OPIATES: NOT DETECTED
TETRAHYDROCANNABINOL: POSITIVE — AB

## 2015-11-26 LAB — TROPONIN I
Troponin I: <0.03 ng/mL (ref <0.031)
Troponin I: <0.03 ng/mL (ref <0.031)

## 2015-11-26 LAB — TSH: TSH: 1.271 u[IU]/mL (ref 0.350–4.500)

## 2015-11-26 MED ORDER — GI COCKTAIL ~~LOC~~
30.0000 mL | Freq: Two times a day (BID) | ORAL | Status: DC | PRN
  Administered 2015-11-26: 30 mL via ORAL
  Filled 2015-11-26: qty 30

## 2015-11-26 MED ORDER — DILTIAZEM HCL 100 MG IV SOLR
5.0000 mg/h | INTRAVENOUS | Status: DC
  Administered 2015-11-26: 5 mg/h via INTRAVENOUS
  Filled 2015-11-26: qty 100

## 2015-11-26 MED ORDER — SODIUM CHLORIDE 0.9% FLUSH: 3.0000 mL | INTRAVENOUS | Status: DC | PRN

## 2015-11-26 MED ORDER — RIVAROXABAN 20 MG PO TABS
20.0000 mg | ORAL_TABLET | Freq: Every day | ORAL | Status: DC
  Administered 2015-11-26 – 2015-11-27 (×2): 20 mg via ORAL
  Filled 2015-11-26 (×2): qty 1

## 2015-11-26 MED ORDER — ACETAMINOPHEN 650 MG RE SUPP: 650.0000 mg | Freq: Four times a day (QID) | RECTAL | Status: DC | PRN

## 2015-11-26 MED ORDER — DILTIAZEM LOAD VIA INFUSION
10.0000 mg | Freq: Once | INTRAVENOUS | Status: AC
  Administered 2015-11-26: 10 mg via INTRAVENOUS
  Filled 2015-11-26: qty 10

## 2015-11-26 MED ORDER — GI COCKTAIL ~~LOC~~
30.0000 mL | Freq: Once | ORAL | Status: AC
  Administered 2015-11-26: 30 mL via ORAL
  Filled 2015-11-26: qty 30

## 2015-11-26 MED ORDER — DILTIAZEM HCL 100 MG IV SOLR: 5.0000 mg/h | INTRAVENOUS | Status: DC

## 2015-11-26 MED ORDER — ACETAMINOPHEN 325 MG PO TABS
650.0000 mg | ORAL_TABLET | Freq: Four times a day (QID) | ORAL | Status: DC | PRN
  Administered 2015-11-26 – 2015-11-27 (×2): 650 mg via ORAL
  Filled 2015-11-26 (×2): qty 2

## 2015-11-26 MED ORDER — METOPROLOL TARTRATE 25 MG PO TABS
25.0000 mg | ORAL_TABLET | Freq: Two times a day (BID) | ORAL | Status: DC
  Administered 2015-11-26 – 2015-11-27 (×2): 25 mg via ORAL
  Filled 2015-11-26 (×2): qty 1

## 2015-11-26 MED ORDER — ALBUTEROL SULFATE HFA 108 (90 BASE) MCG/ACT IN AERS: 2.0000 | INHALATION_SPRAY | Freq: Four times a day (QID) | RESPIRATORY_TRACT | Status: DC | PRN

## 2015-11-26 MED ORDER — SODIUM CHLORIDE 0.9% FLUSH: 3.0000 mL | Freq: Two times a day (BID) | INTRAVENOUS | Status: DC

## 2015-11-26 MED ORDER — SODIUM CHLORIDE 0.9 % IV SOLN: 250.0000 mL | INTRAVENOUS | Status: DC | PRN

## 2015-11-26 MED ORDER — ONDANSETRON HCL 4 MG PO TABS: 4.0000 mg | ORAL_TABLET | Freq: Four times a day (QID) | ORAL | Status: DC | PRN

## 2015-11-26 MED ORDER — SENNOSIDES-DOCUSATE SODIUM 8.6-50 MG PO TABS
1.0000 | ORAL_TABLET | Freq: Every evening | ORAL | Status: DC | PRN
  Administered 2015-11-27: 1 via ORAL
  Filled 2015-11-26: qty 1

## 2015-11-26 MED ORDER — MOMETASONE FURO-FORMOTEROL FUM 200-5 MCG/ACT IN AERO
2.0000 | INHALATION_SPRAY | Freq: Two times a day (BID) | RESPIRATORY_TRACT | Status: DC
  Administered 2015-11-26 – 2015-11-27 (×2): 2 via RESPIRATORY_TRACT
  Filled 2015-11-26: qty 8.8

## 2015-11-26 MED ORDER — ONDANSETRON HCL 4 MG/2ML IJ SOLN: 4.0000 mg | Freq: Four times a day (QID) | INTRAMUSCULAR | Status: DC | PRN

## 2015-11-26 MED ORDER — MORPHINE SULFATE (PF) 2 MG/ML IV SOLN
1.0000 mg | INTRAVENOUS | Status: DC | PRN
  Administered 2015-11-26: 1 mg via INTRAVENOUS
  Filled 2015-11-26: qty 1

## 2015-11-26 MED ORDER — TIOTROPIUM BROMIDE MONOHYDRATE 18 MCG IN CAPS
18.0000 ug | ORAL_CAPSULE | Freq: Every day | RESPIRATORY_TRACT | Status: DC
  Filled 2015-11-26 (×2): qty 5

## 2015-11-26 MED ORDER — ALBUTEROL SULFATE (2.5 MG/3ML) 0.083% IN NEBU: 2.5000 mg | INHALATION_SOLUTION | Freq: Four times a day (QID) | RESPIRATORY_TRACT | Status: DC | PRN

## 2015-11-26 MED ORDER — HYDROCODONE-ACETAMINOPHEN 5-325 MG PO TABS
1.0000 | ORAL_TABLET | ORAL | Status: DC | PRN
  Administered 2015-11-27: 1 via ORAL
  Filled 2015-11-26: qty 2

## 2015-11-26 MED ORDER — DILTIAZEM HCL ER COATED BEADS 240 MG PO CP24
240.0000 mg | ORAL_CAPSULE | Freq: Every day | ORAL | Status: DC
Start: 2015-11-26 — End: 2015-11-27
  Administered 2015-11-26 – 2015-11-27 (×2): 240 mg via ORAL
  Filled 2015-11-26 (×2): qty 1

## 2015-11-26 MED ORDER — ALUM & MAG HYDROXIDE-SIMETH 200-200-20 MG/5ML PO SUSP: 30.0000 mL | ORAL | Status: DC | PRN

## 2015-11-26 MED ORDER — PANTOPRAZOLE SODIUM 20 MG PO TBEC: 20.0000 mg | DELAYED_RELEASE_TABLET | Freq: Every day | ORAL | Status: DC

## 2015-11-26 MED ORDER — SODIUM CHLORIDE 0.9% FLUSH
3.0000 mL | Freq: Two times a day (BID) | INTRAVENOUS | Status: DC
  Administered 2015-11-26 – 2015-11-27 (×3): 3 mL via INTRAVENOUS

## 2015-11-26 NOTE — ED Notes (Signed)
Pt c/o CP and coughing for the past several days, c/o nausea, denies SOB, pt also c/o feeling "jittery and my heart is going crazy" after taking a protonix for my stomach.

## 2015-11-26 NOTE — ED Notes (Signed)
David Merrell, MD at bedside.  

## 2015-11-26 NOTE — ED Notes (Signed)
Shelby Black, NP at bedside 

## 2015-11-26 NOTE — ED Notes (Signed)
Ordered lunch tray for patient

## 2015-11-26 NOTE — ED Notes (Signed)
Spoke to NP in regards to pt having some mild chest pain. NP aware. I also informed NP that pt's blood pressure has been running low, last BP 103/80.

## 2015-11-26 NOTE — ED Provider Notes (Signed)
CSN: [id number]     Arrival date & time 11/26/15  [id number] History   First MD Initiated Contact with Patient 11/26/15 [phone]     Chief Complaint  Patient presents with  . Chest Pain    The history is provided by the patient.   Patient presents with dull epigastric pain. States it is her reflux. States she took a Protonix for it this morning and now feels her heart racing. She was seen in the ER 2 days ago and found to have an atrophic relation with RVR that resolved in the ER. Has a history of afib and is on anticoagulation. She has been coughing. States she thinks she is allergic to Protonix. No fevers. No swelling or legs. No recent change in her medicine, besides protonix.   Past Medical History  Diagnosis Date  . Asthma   . Hypertension   . GERD (gastroesophageal reflux disease)   . A-fib ([place])   . Migraine   . Depression    Past Surgical History  Procedure Laterality Date  . Tubal ligation     Family History  Problem Relation Age of Onset  . Cancer Mother   . Cancer Father   . Heart attack Mother 35   Social History  Substance Use Topics  . Smoking status: Former Smoker    Types: Cigarettes    Quit date: 11/07/2011  . Smokeless tobacco: Never Used  . Alcohol Use: No   OB History    No data available     Review of Systems  Constitutional: Negative for appetite change.  Respiratory: Positive for cough and shortness of breath.   Cardiovascular: Positive for chest pain.  Gastrointestinal: Negative for abdominal pain.  Genitourinary: Negative for flank pain.  Musculoskeletal: Negative for back pain.  Skin: Negative for wound.  Neurological: Negative for headaches.  Psychiatric/Behavioral: Negative for confusion.      Allergies  Celecoxib  Home Medications   Prior to Admission medications   Medication Sig Start Date End Date Taking? Authorizing Provider  albuterol (PROVENTIL HFA;VENTOLIN HFA) 108 (90 Base) MCG/ACT inhaler Inhale 2 puffs into the lungs every 6  (six) hours as needed for wheezing or shortness of breath.   Yes Historical Provider, MD  albuterol (PROVENTIL) (2.5 MG/3ML) 0.083% nebulizer solution Take 3 mLs (2.5 mg total) by nebulization every 6 (six) hours as needed for wheezing or shortness of breath. 07/07/14  Yes Quintella Reichert, MD  cetirizine (ZYRTEC) 10 MG tablet Take 10 mg by mouth daily as needed for allergies.  10/11/15  Yes Historical Provider, MD  diltiazem (CARDIZEM CD) 240 MG 24 hr capsule Take 1 capsule (240 mg total) by mouth daily. 07/01/15  Yes Brett Canales, PA-C  feeding supplement, ENSURE ENLIVE, (ENSURE ENLIVE) LIQD Take 237 mLs by mouth 2 (two) times daily between meals. 06/12/15  Yes Costin Karlyne Greenspan, MD  Fluticasone-Salmeterol (ADVAIR DISKUS) 250-50 MCG/DOSE AEPB Inhale 1 puff into the lungs 2 (two) times daily.   Yes Historical Provider, MD  hydrochlorothiazide (HYDRODIURIL) 25 MG tablet Take 25 mg by mouth daily. 05/13/15  Yes Historical Provider, MD  pantoprazole (PROTONIX) 20 MG tablet Take 1 tablet (20 mg total) by mouth daily. 10/09/15  Yes Carmin Muskrat, MD  rivaroxaban (XARELTO) 20 MG TABS tablet Take 1 tablet (20 mg total) by mouth daily with supper. 06/10/15  Yes Nita Sells, MD  tiotropium (SPIRIVA HANDIHALER) 18 MCG inhalation capsule Place 1 capsule (18 mcg total) into inhaler and inhale daily. 06/22/13  Yes Lacretia Leigh,  MD   BP 128/74 mmHg  Pulse 73  Temp(Src) 98.1 F (36.7 C) (Oral)  Resp 15  Ht 5\' 7"  (1.702 m)  Wt 120 lb 4.8 oz (54.568 kg)  BMI 18.84 kg/m2  SpO2 99% Physical Exam  Constitutional: She appears well-developed.  HENT:  Head: Atraumatic.  Neck: Neck supple.  Cardiovascular:  Irregular. Tachycardia.  Pulmonary/Chest: Effort normal.  Abdominal: Soft. There is no tenderness.  Musculoskeletal: She exhibits no edema or tenderness.  Neurological: She is alert.  Skin: Skin is warm.    ED Course  Procedures (including critical care time) Labs Review Labs Reviewed  CBC  WITH DIFFERENTIAL/PLATELET - Abnormal; Notable for the following:    RBC 5.13 (*)    All other components within normal limits  URINALYSIS, ROUTINE W REFLEX MICROSCOPIC (NOT AT [facility]) - Abnormal; Notable for the following:    Hgb urine dipstick MODERATE (*)    All other components within normal limits  URINE MICROSCOPIC-ADD ON - Abnormal; Notable for the following:    Squamous Epithelial / LPF 0-5 (*)    Bacteria, UA RARE (*)    All other components within normal limits  I-STAT CHEM 8, ED - Abnormal; Notable for the following:    Glucose, Bld 112 (*)    Hemoglobin 15.3 (*)    All other components within normal limits  TROPONIN I  TSH  TROPONIN I  URINE RAPID DRUG SCREEN, HOSP PERFORMED    Imaging Review Dg Chest Portable 1 View  11/26/2015  CLINICAL DATA:  Chest pain and cough for several days EXAM: PORTABLE CHEST 1 VIEW COMPARISON:  October 09, 2015 FINDINGS: There is no edema or consolidation. Lungs are somewhat hyperexpanded. Heart size and pulmonary vascularity are within normal limits. No adenopathy. No pneumothorax. No bone lesions. IMPRESSION: Lungs hyperexpanded without edema or consolidation. Electronically Signed   By: Lowella Grip III M.D.   On: 11/26/2015 07:26   I have personally reviewed and evaluated these images and lab results as part of my medical decision-making.   EKG Interpretation   Date/Time:  Wednesday November 26 2015 06:54:00 EDT Ventricular Rate:  105 PR Interval:    QRS Duration: 89 QT Interval:  339 QTC Calculation: 448 R Axis:   66 Text Interpretation:  Atrial fibrillation Paired ventricular premature  complexes RSR' in V1 or V2, probably normal variant Confirmed by Necha Harries   MD, Arriyah Madej [id number]) on 11/26/2015 7:00:12 AM      MDM   Final diagnoses:  Atrial fibrillation with rapid ventricular response ([place])    Patient with chest pain. EKG showed A. fib with RVR. History of same. Recently treated for same. Required IV Cardizem for rate  control. Admit to stepdown bed.  CRITICAL CARE Performed by: Mackie Pai Total critical care time: 30 minutes Critical care time was exclusive of separately billable procedures and treating other patients. Critical care was necessary to treat or prevent imminent or life-threatening deterioration. Critical care was time spent personally by me on the following activities: development of treatment plan with patient and/or surrogate as well as nursing, discussions with consultants, evaluation of patient's response to treatment, examination of patient, obtaining history from patient or surrogate, ordering and performing treatments and interventions, ordering and review of laboratory studies, ordering and review of radiographic studies, pulse oximetry and re-evaluation of patient's condition.       Davonna Belling, MD 11/26/15 [phone]

## 2015-11-26 NOTE — ED Notes (Signed)
Pt ambulated to bathroom with no difficulty 

## 2015-11-26 NOTE — Consult Note (Signed)
Reason for Consult:   Recurrent PAF with RVR  Requesting Physician: [facility] Primary Cardiologist Dr Ellyn Hack  HPI:   Pleasant 65 y/o AA female with a history of PAF since Nov 2016. She converts easily with Diltiazem. She is on Diltiazem at home and reports compliance. She is a CHADs VASc 3 for age, sex, HTN. She is on Xarelto and reports compliance with this as well.  She was supposed to have an OP Myoview after her LOV in Nov but "got sick that day". She has been having epigastric symptoms and was prescribed Protonix. Today she took her Protonix and her heart started racing. She did have some chest discomfort associated with this. She presented to the ED and converted to NSR after IV Diltiazem was started. She has a history of asthma and tells me her PCP prescribes her medication for her allergies and "sinus problems" but she is not sure what it is. She does drink caffeine. She rarely smokes marijuana and denies other recreational drug use.   PMHx:  Past Medical History  Diagnosis Date  . Asthma   . Hypertension   . GERD (gastroesophageal reflux disease)   . A-fib ([place])   . Migraine   . Depression     Past Surgical History  Procedure Laterality Date  . Tubal ligation      SOCHx:  reports that she quit smoking about 4 years ago. Her smoking use included Cigarettes. She has never used smokeless tobacco. She reports that she uses illicit drugs (Marijuana). She reports that she does not drink alcohol.  FAMHx: Family History  Problem Relation Age of Onset  . Cancer Mother   . Cancer Father   . Heart attack Mother 50    ALLERGIES: Allergies  Allergen Reactions  . Celecoxib Rash    ROS: Review of Systems: General: negative for chills, fever, night sweats or weight changes.  Cardiovascular: negative for chest pain, dyspnea on exertion, edema, orthopnea, palpitations, paroxysmal nocturnal dyspnea or shortness of breath HEENT: negative for any visual  disturbances, blindness, glaucoma Dermatological: negative for rash Respiratory: negative for cough, hemoptysis, or wheezing Urologic: negative for hematuria or dysuria Abdominal: negative for nausea, vomiting, diarrhea, bright red blood per rectum, melena, or hematemesis Neurologic: negative for visual changes, syncope, or dizziness Musculoskeletal: negative for back pain, joint pain, or swelling Psych: cooperative and appropriate All other systems reviewed and are otherwise negative except as noted above.   HOME MEDICATIONS: Prior to Admission medications   Medication Sig Start Date End Date Taking? Authorizing Provider  albuterol (PROVENTIL HFA;VENTOLIN HFA) 108 (90 Base) MCG/ACT inhaler Inhale 2 puffs into the lungs every 6 (six) hours as needed for wheezing or shortness of breath.   Yes Historical Provider, MD  albuterol (PROVENTIL) (2.5 MG/3ML) 0.083% nebulizer solution Take 3 mLs (2.5 mg total) by nebulization every 6 (six) hours as needed for wheezing or shortness of breath. 07/07/14  Yes Quintella Reichert, MD  cetirizine (ZYRTEC) 10 MG tablet Take 10 mg by mouth daily as needed for allergies.  10/11/15  Yes Historical Provider, MD  diltiazem (CARDIZEM CD) 240 MG 24 hr capsule Take 1 capsule (240 mg total) by mouth daily. 07/01/15  Yes Brett Canales, PA-C  feeding supplement, ENSURE ENLIVE, (ENSURE ENLIVE) LIQD Take 237 mLs by mouth 2 (two) times daily between meals. 06/12/15  Yes Costin Karlyne Greenspan, MD  Fluticasone-Salmeterol (ADVAIR DISKUS) 250-50 MCG/DOSE AEPB Inhale 1 puff into the lungs 2 (two) times  daily.   Yes Historical Provider, MD  hydrochlorothiazide (HYDRODIURIL) 25 MG tablet Take 25 mg by mouth daily. 05/13/15  Yes Historical Provider, MD  pantoprazole (PROTONIX) 20 MG tablet Take 1 tablet (20 mg total) by mouth daily. 10/09/15  Yes Carmin Muskrat, MD  rivaroxaban (XARELTO) 20 MG TABS tablet Take 1 tablet (20 mg total) by mouth daily with supper. 06/10/15  Yes Nita Sells,  MD  tiotropium (SPIRIVA HANDIHALER) 18 MCG inhalation capsule Place 1 capsule (18 mcg total) into inhaler and inhale daily. 06/22/13  Yes Lacretia Leigh, MD    HOSPITAL MEDICATIONS: I have reviewed the patient's current medications.  VITALS: Blood pressure 107/68, pulse 45, temperature 97.5 F (36.4 C), resp. rate 18, height 5\' 7"  (1.702 m), weight 121 lb (54.885 kg), SpO2 99 %.  PHYSICAL EXAM: General appearance: alert, cooperative and no distress Neck: no carotid bruit and no JVD Lungs: clear to auscultation bilaterally Heart: regular rate and rhythm Abdomen: soft, non-tender; bowel sounds normal; no masses,  no organomegaly Extremities: extremities normal, atraumatic, no cyanosis or edema Pulses: 2+ and symmetric Skin: Skin color, texture, turgor normal. No rashes or lesions Neurologic: Grossly normal  LABS: Results for orders placed or performed during the hospital encounter of 11/26/15 (from the past 24 hour(s))  CBC with Differential     Status: Abnormal   Collection Time: 11/26/15  6:55 AM  Result Value Ref Range   WBC 4.4 4.0 - 10.5 K/uL   RBC 5.13 (H) 3.87 - 5.11 MIL/uL   Hemoglobin 14.0 12.0 - 15.0 g/dL   HCT 41.0 36.0 - 46.0 %   MCV 79.9 78.0 - 100.0 fL   MCH 27.3 26.0 - 34.0 pg   MCHC 34.1 30.0 - 36.0 g/dL   RDW 15.5 11.5 - 15.5 %   Platelets 210 150 - 400 K/uL   Neutrophils Relative % 42 %   Neutro Abs 1.9 1.7 - 7.7 K/uL   Lymphocytes Relative 42 %   Lymphs Abs 1.9 0.7 - 4.0 K/uL   Monocytes Relative 11 %   Monocytes Absolute 0.5 0.1 - 1.0 K/uL   Eosinophils Relative 4 %   Eosinophils Absolute 0.2 0.0 - 0.7 K/uL   Basophils Relative 1 %   Basophils Absolute 0.0 0.0 - 0.1 K/uL  Troponin I     Status: None   Collection Time: 11/26/15  6:55 AM  Result Value Ref Range   Troponin I <0.03 <0.031 ng/mL  I-stat Chem 8, ED     Status: Abnormal   Collection Time: 11/26/15  7:21 AM  Result Value Ref Range   Sodium 141 135 - 145 mmol/L   Potassium 3.5 3.5 - 5.1  mmol/L   Chloride 103 101 - 111 mmol/L   BUN 12 6 - 20 mg/dL   Creatinine, Ser 0.70 0.44 - 1.00 mg/dL   Glucose, Bld 112 (H) 65 - 99 mg/dL   Calcium, Ion 1.18 1.13 - 1.30 mmol/L   TCO2 27 0 - 100 mmol/L   Hemoglobin 15.3 (H) 12.0 - 15.0 g/dL   HCT 45.0 36.0 - 46.0 %  Urinalysis, Routine w reflex microscopic (not at [facility])     Status: Abnormal   Collection Time: 11/26/15  8:41 AM  Result Value Ref Range   Color, Urine YELLOW YELLOW   APPearance CLEAR CLEAR   Specific Gravity, Urine 1.006 1.005 - 1.030   pH 7.5 5.0 - 8.0   Glucose, UA NEGATIVE NEGATIVE mg/dL   Hgb urine dipstick MODERATE (A) NEGATIVE  Bilirubin Urine NEGATIVE NEGATIVE   Ketones, ur NEGATIVE NEGATIVE mg/dL   Protein, ur NEGATIVE NEGATIVE mg/dL   Nitrite NEGATIVE NEGATIVE   Leukocytes, UA NEGATIVE NEGATIVE  Urine microscopic-add on     Status: Abnormal   Collection Time: 11/26/15  8:41 AM  Result Value Ref Range   Squamous Epithelial / LPF 0-5 (A) NONE SEEN   WBC, UA NONE SEEN 0 - 5 WBC/hpf   RBC / HPF 0-5 0 - 5 RBC/hpf   Bacteria, UA RARE (A) NONE SEEN    EKG: AF with RVR  Echo: 06/11/2015 Study Conclusions  - Left ventricle: The cavity size was normal. Systolic function was  normal. The estimated ejection fraction was in the range of 60%  to 65%. Wall motion was normal; there were no regional wall  motion abnormalities. Doppler parameters are consistent with  abnormal left ventricular relaxation (grade 1 diastolic  dysfunction). - Tricuspid valve: There was mild-moderate regurgitation directed  centrally. - LA size normal   IMAGING: Dg Chest Portable 1 View  11/26/2015  CLINICAL DATA:  Chest pain and cough for several days EXAM: PORTABLE CHEST 1 VIEW COMPARISON:  October 09, 2015 FINDINGS: There is no edema or consolidation. Lungs are somewhat hyperexpanded. Heart size and pulmonary vascularity are within normal limits. No adenopathy. No pneumothorax. No bone lesions. IMPRESSION: Lungs  hyperexpanded without edema or consolidation. Electronically Signed   By: Lowella Grip III M.D.   On: 11/26/2015 07:26    IMPRESSION: Principal Problem:   Atrial fibrillation with RVR (HCC) Active Problems:   Chest pain   Essential hypertension   Asthma   PAF (paroxysmal atrial fibrillation) (HCC)   Chronic anticoagulation   GERD   Acute hypokalemia   RECOMMENDATION: Will review with MD- ? Consider an antiarrythmic- possibly Flecainide. Would avoid Betapace with her history of asthma. She needs a Myoview- ? Order in am while she is here.   Time Spent Directly with Patient: 38 minutes  Kerin Ransom, [place] beeper 11/26/2015, 12:07 PM  Patient seen and examined and history reviewed. Agree with above findings and plan. 65 yo BF with paroxysmal Afib. Presented with sudden onset palpitations associated with chest tightness. In Afib with RVR rate up to 158. On IV cardizem she has converted to NSR. Cardiac exam is normal.  Reports this happened 2 weeks ago. Prior to that last episode was last summer. Agree with completing cardiac work up with a stress Myoview. If normal we may want to consider a prescription for Flecainide either as a prn "pill in a pocket" or maintenance dose. Can DC IV diltiazem and resume PO dose.  Peter Martinique, [place] 11/26/2015 1:48 PM

## 2015-11-26 NOTE — H&P (Signed)
[facility] History and Physical  [name] [id number] DOB: 1950/10/31 DOA: 11/26/2015  Referring physician: Alvino Chapel PCP: Rogers Blocker, MD   Chief Complaint: palpitations  HPI: [name] is a 65 y.o. female with a past medical history that includes A. Fib on xarelto, hypertension, asthma, marijuana use presents to the emergency department with the chief complaint of palpitations. Initial evaluation reveals A. fib with RVR at a rate 136.  Summation is obtained from the patient. Patient reports a "burning sensation in her stomach and epigastric area since yesterday. She reports taking protonix exception shortly thereafter she felt her heart "going crazy". She reports having had this in the past but did not feel it is strongly in the past as she did this time. Associated symptoms include nausea without vomiting headache decreased oral intake. He also reports some intermittent chest pain located left anterior describes a sharp rated 3 on a scale 1-10. He denies shortness of breath dizziness syncope or near-syncope. She denies any recent illness. She does report an intermittent nonproductive cough. She denies any nausea vomiting constipation diarrhea. She denies dysuria hematuria frequency or urgency. She reports compliance with all of her medications. Of note she came to the emergency department 2 days ago with same that resolved in the emergency department.  In the emergency department she's afebrile hemodynamically stable heart rate 136. She is provided with Cardizem bolus and a Cardizem drip initiated. At the time of my exam her heart rate is 118  Review of Systems:  10 point review of systems complete and all systems are negative except as indicated in the history of present illness Past Medical History  Diagnosis Date  . Asthma   . Hypertension   . GERD (gastroesophageal reflux disease)   . A-fib ([place])   . Migraine   . Depression    Past Surgical History  Procedure  Laterality Date  . Tubal ligation     Social History:  reports that she quit smoking about 4 years ago. Her smoking use included Cigarettes. She has never used smokeless tobacco. She reports that she uses illicit drugs (Marijuana). She reports that she does not drink alcohol. Lives at home with her significant other and daughter. She continues to smoke marijuana. She is independent with ADLs and unemployed Allergies  Allergen Reactions  . Celecoxib Rash    Family History  Problem Relation Age of Onset  . Cancer Mother   . Cancer Father   . Heart attack Mother 76     Prior to Admission medications   Medication Sig Start Date End Date Taking? Authorizing Provider  albuterol (PROVENTIL HFA;VENTOLIN HFA) 108 (90 Base) MCG/ACT inhaler Inhale 2 puffs into the lungs every 6 (six) hours as needed for wheezing or shortness of breath.   Yes Historical Provider, MD  albuterol (PROVENTIL) (2.5 MG/3ML) 0.083% nebulizer solution Take 3 mLs (2.5 mg total) by nebulization every 6 (six) hours as needed for wheezing or shortness of breath. 07/07/14  Yes Quintella Reichert, MD  cetirizine (ZYRTEC) 10 MG tablet Take 10 mg by mouth daily as needed for allergies.  10/11/15  Yes Historical Provider, MD  diltiazem (CARDIZEM CD) 240 MG 24 hr capsule Take 1 capsule (240 mg total) by mouth daily. 07/01/15  Yes Brett Canales, PA-C  feeding supplement, ENSURE ENLIVE, (ENSURE ENLIVE) LIQD Take 237 mLs by mouth 2 (two) times daily between meals. 06/12/15  Yes Costin Karlyne Greenspan, MD  Fluticasone-Salmeterol (ADVAIR DISKUS) 250-50 MCG/DOSE AEPB Inhale 1 puff into  the lungs 2 (two) times daily.   Yes Historical Provider, MD  hydrochlorothiazide (HYDRODIURIL) 25 MG tablet Take 25 mg by mouth daily. 05/13/15  Yes Historical Provider, MD  pantoprazole (PROTONIX) 20 MG tablet Take 1 tablet (20 mg total) by mouth daily. 10/09/15  Yes Carmin Muskrat, MD  rivaroxaban (XARELTO) 20 MG TABS tablet Take 1 tablet (20 mg total) by mouth daily  with supper. 06/10/15  Yes Nita Sells, MD  tiotropium (SPIRIVA HANDIHALER) 18 MCG inhalation capsule Place 1 capsule (18 mcg total) into inhaler and inhale daily. 06/22/13  Yes Lacretia Leigh, MD   Physical Exam: Filed Vitals:   11/26/15 0815 11/26/15 0830 11/26/15 0947 11/26/15 1000  BP: 117/90 117/75 129/85 142/112  Pulse: 88 49 77 101  Temp:      Resp: 25 21 29 20   Height:      Weight:      SpO2: 98% 100% 100% 97%    Wt Readings from Last 3 Encounters:  11/26/15 54.885 kg (121 lb)  10/09/15 54.885 kg (121 lb)  07/11/15 54.658 kg (120 lb 8 oz)    General:  Appears calm and comfortable Eyes: PERRL, normal lids, irises & conjunctiva ENT: grossly normal hearing, lips & tongue Neck: no LAD, masses or thyromegaly Cardiovascular: irregularly irregular, no m/r/g. No LE edema. Telemetry: SR, no arrhythmias  Respiratory: CTA bilaterally, no w/r/r. Normal respiratory effort. Abdomen: soft, ntnd no guarding or rebounding Skin: no rash or induration seen on limited exam Musculoskeletal: grossly normal tone BUE/BLE Psychiatric: grossly normal mood and affect, speech fluent and appropriate Neurologic: grossly non-focal. Alert and oriented 3 speech clear           Labs on Admission:  Basic Metabolic Panel:  Recent Labs Lab 11/24/15 1737 11/26/15 0721  NA 138 141  K 2.9* 3.5  CL 103 103  CO2 22  --   GLUCOSE 136* 112*  BUN 9 12  CREATININE 0.73 0.70  CALCIUM 10.1  --    Liver Function Tests:  Recent Labs Lab 11/24/15 1737  AST 24  ALT 20  ALKPHOS 73  BILITOT 0.7  PROT 8.2*  ALBUMIN 4.2    Recent Labs Lab 11/24/15 1737  LIPASE 23   No results for input(s): AMMONIA in the last 168 hours. CBC:  Recent Labs Lab 11/24/15 1737 11/26/15 0655 11/26/15 0721  WBC 4.2 4.4  --   NEUTROABS  --  1.9  --   HGB 13.7 14.0 15.3*  HCT 41.2 41.0 45.0  MCV 78.9 79.9  --   PLT 240 210  --    Cardiac Enzymes:  Recent Labs Lab 11/26/15 0655  TROPONINI  <0.03    BNP (last 3 results)  Recent Labs  06/11/15 0718  BNP 17.5    ProBNP (last 3 results) No results for input(s): PROBNP in the last 8760 hours.  CBG: No results for input(s): GLUCAP in the last 168 hours.  Radiological Exams on Admission: Dg Chest Portable 1 View  11/26/2015  CLINICAL DATA:  Chest pain and cough for several days EXAM: PORTABLE CHEST 1 VIEW COMPARISON:  October 09, 2015 FINDINGS: There is no edema or consolidation. Lungs are somewhat hyperexpanded. Heart size and pulmonary vascularity are within normal limits. No adenopathy. No pneumothorax. No bone lesions. IMPRESSION: Lungs hyperexpanded without edema or consolidation. Electronically Signed   By: Lowella Grip III M.D.   On: 11/26/2015 07:26    EKG: Independently reviewed. A. Fib Atrial fibrillation Paired ventricular premature complexes RSR' in V1  or V2,  Assessment/Plan Principal Problem:   Atrial fibrillation with RVR (HCC) Active Problems:   Essential hypertension   Asthma   GERD   Acute hypokalemia   Chest pain   A-fib ([place])  1. A. fib with RVR. Etiology uncertain. Chest x-ray without edema or consolidation. Echo done in October 2016 reveal an EF of [id number] grade 1 diastolic dysfunction. She is provided with Cardizem bolus and a drip initiated. She was in the emergency department 2 days ago with same but converted while in the ED. In addition she was hospitalized in November of last year with same. Chart review indicates she saw cards in November post hospital of last year and recommended a stress test at that time. Patient never followed up. Chadvasc score 3. On xarelto. Reports compliance with meds. Home medications include 240 mg of Cardizem -Admit to step down -Continue Cardizem drip with weaning parameters -Transition to by mouth cardizem when able -add metoprolol -Cycle troponin -obtain TSH -Serial EKG -Continue Xarelto -Cardiology consult  #2. Asthma. Appears stable at  baseline. -Continue home inhalers -Monitor oxygen saturation level  #3. GERD. Complains of epigastric pain. Believes she is allergic to Protonix. -Mylanta -GI cocktail  #4. Hypertension. Controlled in the emergency department. -We'll hold HCTZ for now due to potassium being low end of normal. -Monitor and resume when indicated  5. Chest pain. Heart score 4.Troponin negative. EKG as noted above. Of note patient seen by cardiology in November of last year at which time stress test was recommended. She never followed up. May benefit from inpatient stress test -Cycle troponin -Serial EKG -Pain management -cards consult   Code Status: full DVT Prophylaxis: Family Communication: daughter at bedside Disposition Plan: home when ready  Time spent: 52 minutes  [facility]

## 2015-11-26 NOTE — ED Notes (Signed)
Cardiac/heart healthy breakfast tray ordered.

## 2015-11-26 NOTE — ED Notes (Signed)
Breakfast tray at bedside 

## 2015-11-27 ENCOUNTER — Inpatient Hospital Stay (HOSPITAL_COMMUNITY): Payer: Medicare Other

## 2015-11-27 DIAGNOSIS — Z7901 Long term (current) use of anticoagulants: Secondary | ICD-10-CM

## 2015-11-27 DIAGNOSIS — R079 Chest pain, unspecified: Secondary | ICD-10-CM

## 2015-11-27 DIAGNOSIS — E876 Hypokalemia: Secondary | ICD-10-CM

## 2015-11-27 DIAGNOSIS — K210 Gastro-esophageal reflux disease with esophagitis: Secondary | ICD-10-CM

## 2015-11-27 LAB — BASIC METABOLIC PANEL
ANION GAP: 13 (ref 5–15)
BUN: 19 mg/dL (ref 6–20)
CHLORIDE: 101 mmol/L (ref 101–111)
CO2: 26 mmol/L (ref 22–32)
Calcium: 10.2 mg/dL (ref 8.9–10.3)
Creatinine, Ser: 0.82 mg/dL (ref 0.44–1.00)
GFR calc non Af Amer: >60 mL/min (ref >60)
Glucose, Bld: 81 mg/dL (ref 65–99)
Potassium: 3.7 mmol/L (ref 3.5–5.1)
Sodium: 140 mmol/L (ref 135–145)

## 2015-11-27 LAB — NM MYOCAR MULTI W/SPECT W/WALL MOTION / EF
Exercise duration (min): 5 min
LV dias vol: 70 mL (ref 46–106)
LV sys vol: 29 mL
Peak HR: 102 {beats}/min
RATE: 0
Rest HR: 64 {beats}/min
SDS: 5
SRS: 10
SSS: 15
TID: 1.22

## 2015-11-27 MED ORDER — METOPROLOL TARTRATE 12.5 MG HALF TABLET: 12.5000 mg | ORAL_TABLET | Freq: Two times a day (BID) | ORAL | Status: DC

## 2015-11-27 MED ORDER — REGADENOSON 0.4 MG/5ML IV SOLN
0.4000 mg | Freq: Once | INTRAVENOUS | Status: AC
  Administered 2015-11-27: 0.4 mg via INTRAVENOUS
  Filled 2015-11-27: qty 5

## 2015-11-27 MED ORDER — METOPROLOL TARTRATE 25 MG PO TABS: 12.5000 mg | ORAL_TABLET | Freq: Two times a day (BID) | ORAL | Status: DC

## 2015-11-27 MED ORDER — DILTIAZEM HCL ER COATED BEADS 240 MG PO CP24: 240.0000 mg | ORAL_CAPSULE | Freq: Every day | ORAL | Status: DC

## 2015-11-27 MED ORDER — TECHNETIUM TC 99M SESTAMIBI GENERIC - CARDIOLITE
30.0000 | Freq: Once | INTRAVENOUS | Status: AC | PRN
  Administered 2015-11-27: 30 via INTRAVENOUS

## 2015-11-27 MED ORDER — METOPROLOL TARTRATE 25 MG PO TABS: 25.0000 mg | ORAL_TABLET | Freq: Two times a day (BID) | ORAL | Status: DC

## 2015-11-27 MED ORDER — FAMOTIDINE 20 MG PO TABS
40.0000 mg | ORAL_TABLET | Freq: Every day | ORAL | Status: DC
  Administered 2015-11-27: 40 mg via ORAL
  Filled 2015-11-27: qty 2

## 2015-11-27 MED ORDER — TECHNETIUM TC 99M SESTAMIBI GENERIC - CARDIOLITE
10.0000 | Freq: Once | INTRAVENOUS | Status: AC | PRN
  Administered 2015-11-27: 10 via INTRAVENOUS

## 2015-11-27 MED ORDER — OFF THE BEAT BOOK
Freq: Once | Status: DC
  Filled 2015-11-27: qty 1

## 2015-11-27 MED ORDER — FAMOTIDINE 40 MG PO TABS: 40.0000 mg | ORAL_TABLET | Freq: Every day | ORAL | Status: DC

## 2015-11-27 MED ORDER — REGADENOSON 0.4 MG/5ML IV SOLN
INTRAVENOUS | Status: AC
  Filled 2015-11-27: qty 5

## 2015-11-27 NOTE — Progress Notes (Signed)
RN gave pt norco but pt only wanted to take 1 tablet. RN took 2 tablets out of the pyxis. RN forgot to return the tablet before the pt was dischagred. RN consulted  Pharmacy as well as her charge RN to help figure out how to return the pill to the pyxis. Per pharmacy do an inventory count & return it. Medication was returned successfully around 2013 on 11/27/2015 with that RN & the charge nurse Bethena Roys. Hoover Brunette, RN

## 2015-11-27 NOTE — Progress Notes (Signed)
    Subjective:  No palpations or chest pain overnight  Objective:  Vital Signs in the last 24 hours: Temp:  [97.6 F (36.4 C)-98.1 F (36.7 C)] 98.1 F (36.7 C) (04/06 0500) Pulse Rate:  [40-88] 64 (04/06 0918) Resp:  [15-25] 15 (04/05 1547) BP: (92-134)/(62-95) 119/76 mmHg (04/06 0918) SpO2:  [95 %-99 %] 97 % (04/06 0800) Weight:  [119 lb 3.2 oz (54.069 kg)-120 lb 4.8 oz (54.568 kg)] 119 lb 3.2 oz (54.069 kg) (04/06 0500)  Intake/Output from previous day:  Intake/Output Summary (Last 24 hours) at 11/27/15 1005 Last data filed at 11/26/15 2100  Gross per 24 hour  Intake    477 ml  Output      0 ml  Net    477 ml    Physical Exam: General appearance: alert, cooperative and no distress Neck: no JVD Lungs: clear to auscultation bilaterally Heart: regular rate and rhythm Extremities: no edema Neurologic: Grossly normal   Rate: 66  Rhythm: normal sinus rhythm  Lab Results:  Recent Labs  11/24/15 1737 11/26/15 0655 11/26/15 0721  WBC 4.2 4.4  --   HGB 13.7 14.0 15.3*  PLT 240 210  --     Recent Labs  11/24/15 1737 11/26/15 0721 11/27/15 0320  NA 138 141 140  K 2.9* 3.5 3.7  CL 103 103 101  CO2 22  --  26  GLUCOSE 136* 112* 81  BUN 9 12 19   CREATININE 0.73 0.70 0.82    Recent Labs  11/26/15 1201 11/26/15 1916  TROPONINI <0.03 <0.03   No results for input(s): INR in the last 72 hours.  Scheduled Meds: . diltiazem  240 mg Oral Daily  . metoprolol tartrate  25 mg Oral BID  . mometasone-formoterol  2 puff Inhalation BID  . regadenoson      . rivaroxaban  20 mg Oral Q supper  . sodium chloride flush  3 mL Intravenous Q12H  . sodium chloride flush  3 mL Intravenous Q12H  . tiotropium  18 mcg Inhalation Daily   Continuous Infusions:  PRN Meds:.sodium chloride, acetaminophen **OR** acetaminophen, albuterol, gi cocktail, HYDROcodone-acetaminophen, morphine injection, ondansetron **OR** ondansetron (ZOFRAN) IV, senna-docusate, sodium chloride  flush   Imaging: Dg Chest Portable 1 View  11/26/2015  CLINICAL DATA:  Chest pain and cough for several days EXAM: PORTABLE CHEST 1 VIEW COMPARISON:  October 09, 2015 FINDINGS: There is no edema or consolidation. Lungs are somewhat hyperexpanded. Heart size and pulmonary vascularity are within normal limits. No adenopathy. No pneumothorax. No bone lesions. IMPRESSION: Lungs hyperexpanded without edema or consolidation. Electronically Signed   By: Lowella Grip III M.D.   On: 11/26/2015 07:26    Cardiac Studies:  Assessment/Plan:   Principal Problem:   Atrial fibrillation with RVR (HCC) Active Problems:   Chest pain   Essential hypertension   Asthma   PAF (paroxysmal atrial fibrillation) (HCC)   Chronic anticoagulation   GERD   Acute hypokalemia   PLAN: Lexiscan Myoview today (pt said she couldn't do the treadmill). Images pending.   Kerin Ransom PA-C 11/27/2015, 10:05 AM [phone]  Patient seen and examined and history reviewed. Agree with above findings and plan. No further afib since she converted at 11:15 yesterday. Myoview in progress. Potassium repleted. If Myoview is negative she can go home today on diltiazem and metoprolol. If she continues to have more frequent Afib we could consider antiarrhythmic drug therapy. Avoid stimulants.   Female Minish Martinique, [place] 11/27/2015 11:36 AM

## 2015-11-27 NOTE — Discharge Instructions (Signed)

## 2015-11-27 NOTE — Progress Notes (Signed)
Pt given discharge instructions & education. Pt's IV was removed as well as her telemetry box. CCMD was notified of the pt being discharged. Pt instructed not to take Claritin D just he regular Claritin for the decongestion that she's having & to ask the pharmacist when she picks up her prescriptions if she doesn't remember or has any questions. Hoover Brunette, RN

## 2015-11-27 NOTE — Discharge Summary (Addendum)
Physician Discharge Summary  [name] [id number] DOB: [birth year] DOA: 11/26/2015  PCP: Rogers Blocker, MD  Admit date: 11/26/2015 Discharge date: 11/27/2015  Time spent: 35 minutes  Recommendations for Outpatient Follow-up:  1. May need GI referral if GERD does not improve with pepcid   Discharge Diagnoses:  Principal Problem:   Atrial fibrillation with RVR ([place]) Active Problems:   Essential hypertension   Asthma   GERD   Acute hypokalemia   Chest pain   PAF (paroxysmal atrial fibrillation) (H[facility])   Chronic anticoagulation   Discharge Condition: improved  Diet recommendation: heart healthy  Filed Weights   11/26/15 0655 11/26/15 1547 11/27/15 0500  Weight: 54.885 kg (121 lb) 54.568 kg (120 lb 4.8 oz) 54.069 kg (119 lb 3.2 oz)    History of present illness:  [name] is a 65 y.o. female with a past medical history that includes A. Fib on xarelto, hypertension, asthma, marijuana use presents to the emergency department with the chief complaint of palpitations. Initial evaluation reveals A. fib with RVR at a rate 136.  Summation is obtained from the patient. Patient reports a "burning sensation in her stomach and epigastric area since yesterday. She reports taking protonix exception shortly thereafter she felt her heart "going crazy". She reports having had this in the past but did not feel it is strongly in the past as she did this time. Associated symptoms include nausea without vomiting headache decreased oral intake. He also reports some intermittent chest pain located left anterior describes a sharp rated 3 on a scale 1-10. He denies shortness of breath dizziness syncope or near-syncope. She denies any recent illness. She does report an intermittent nonproductive cough. She denies any nausea vomiting constipation diarrhea. She denies dysuria hematuria frequency or urgency. She reports compliance with all of her medications. Of note she came to the emergency department 2  days ago with same that resolved in the emergency department.  In the emergency department she's afebrile hemodynamically stable heart rate 136. She is provided with Cardizem bolus and a Cardizem drip initiated. At the time of my exam her heart rate is [place] [facility] Course:  A fib with RVR: No further afib since she converted at 11:15 yesterday. Myoview negative. Potassium repleted. D/c on diltiazem and metoprolol. If she continues to have more frequent Afib we could consider antiarrhythmic drug therapy. Avoid stimulants. GERD- pepcid-- if not better may need EGD/GI referral  Procedures:  myoview  Consultations:  cards  Discharge Exam: Filed Vitals:   11/27/15 1115 11/27/15 1438  BP: 118/69 107/69  Pulse: 66 66  Temp:  98 F (36.7 C)  Resp:      General: NAD  Discharge Instructions   Discharge Instructions    Diet - low sodium heart healthy    Complete by:  As directed      Discharge instructions    Complete by:  As directed   GERD diet Food Choices for Gastroesophageal Reflux Disease, Adult When you have gastroesophageal reflux disease (GERD), the foods you eat and your eating habits are very important. Choosing the right foods can help ease your discomfort.   WHAT GUIDELINES DO I NEED TO FOLLOW?   Choose fruits, vegetables, whole grains, and low-fat dairy products.   Choose low-fat meat, fish, and poultry. graphicLimit fats such as oils, salad dressings, butter, nuts, and avocado.   Keep a food diary. This helps you identify foods that cause symptoms.   Avoid foods that cause symptoms. These may  be different for everyone.   Eat small meals often instead of 3 large meals a day.   Eat your meals slowly, in a place where you are relaxed.   Limit fried foods.   Cook foods using methods other than frying.   Avoid drinking alcohol.   Avoid drinking large amounts of liquids with your meals.   Avoid bending over or lying down until 2-3 hours after eating.   WHAT FOODS ARE  NOT RECOMMENDED?   These are some foods and drinks that may make your symptoms worse: Vegetables Tomatoes. Tomato juice. Tomato and spaghetti sauce. Chili peppers. Onion and garlic. Horseradish. Fruits Oranges, grapefruit, and lemon (fruit and juice). Meats High-fat meats, fish, and poultry. This includes hot dogs, ribs, ham, sausage, salami, and bacon. Dairy Whole milk and chocolate milk. Sour cream. Cream. Butter. Ice cream. Cream cheese.   Drinks Coffee and tea. Bubbly (carbonated) drinks or energy drinks. Condiments Hot sauce. Barbecue sauce.   Sweets/Desserts Chocolate and cocoa. Donuts. Peppermint and spearmint. Fats and Oils High-fat foods. This includes [place] fries and potato chips. Other Vinegar. Strong spices. This includes black pepper, white pepper, red pepper, cayenne, curry powder, cloves, ginger, and chili powder. The items listed above may not be a complete list of foods and drinks to avoid. Contact your dietitian for more information.   This information is not intended to replace advice given to you by your health care provider. Make sure you discuss any questions you have with your health care provider.   Document Released: 02/08/2012 Document Revised: 08/30/2014 Document Reviewed: 06/13/2013 Elsevier Interactive Patient Education 2016 [employer] Inc[employer]     Increase activity slowly    Complete by:  As directed           Current Discharge Medication List    START taking these medications   Details  famotidine (PEPCID) 40 MG tablet Take 1 tablet (40 mg total) by mouth daily. Qty: 30 tablet, Refills: 0    metoprolol tartrate (LOPRESSOR) 25 MG tablet Take 0.5 tablets (12.5 mg total) by mouth 2 (two) times daily. Qty: 60 tablet, Refills: 0      CONTINUE these medications which have CHANGED   Details  diltiazem (CARDIZEM CD) 240 MG 24 hr capsule Take 1 capsule (240 mg total) by mouth daily. Qty: 30 capsule, Refills: 0      CONTINUE these medications which  have NOT CHANGED   Details  albuterol (PROVENTIL HFA;VENTOLIN HFA) 108 (90 Base) MCG/ACT inhaler Inhale 2 puffs into the lungs every 6 (six) hours as needed for wheezing or shortness of breath.    albuterol (PROVENTIL) (2.5 MG/3ML) 0.083% nebulizer solution Take 3 mLs (2.5 mg total) by nebulization every 6 (six) hours as needed for wheezing or shortness of breath. Qty: 75 mL, Refills: 12    cetirizine (ZYRTEC) 10 MG tablet Take 10 mg by mouth daily as needed for allergies.  Refills: 3    feeding supplement, ENSURE ENLIVE, (ENSURE ENLIVE) LIQD Take 237 mLs by mouth 2 (two) times daily between meals. Qty: 60 Bottle, Refills: 2    Fluticasone-Salmeterol (ADVAIR DISKUS) 250-50 MCG/DOSE AEPB Inhale 1 puff into the lungs 2 (two) times daily.    rivaroxaban (XARELTO) 20 MG TABS tablet Take 1 tablet (20 mg total) by mouth daily with supper. Qty: 30 tablet, Refills: 0    tiotropium (SPIRIVA HANDIHALER) 18 MCG inhalation capsule Place 1 capsule (18 mcg total) into inhaler and inhale daily. Qty: 30 capsule, Refills: 0  STOP taking these medications     hydrochlorothiazide (HYDRODIURIL) 25 MG tablet      pantoprazole (PROTONIX) 20 MG tablet        Allergies  Allergen Reactions  . Celecoxib Rash   Follow-up Information    Follow up with Rogers Blocker, MD In 1 week.   Specialty:  Internal Medicine   Contact information:   [address] [place] [address] [phone]        The results of significant diagnostics from this hospitalization (including imaging, microbiology, ancillary and laboratory) are listed below for reference.    Significant Diagnostic Studies: Nm Myocar Multi W/spect W/wall Motion / Ef  11/27/2015   No T wave inversion was noted during stress.  There was no ST segment deviation noted during stress.  There is a medium defect of mild severity present in the basal inferoseptal, mid inferoseptal and apical septal location. The defect is  non-reversible. This is most consistent with extracardiac activity on both rest and stress. No ischemia noted.  This is a low risk study.  The left ventricular ejection fraction is normal (55-65%).  Nuclear stress EF: 58%.    Dg Chest Portable 1 View  11/26/2015  CLINICAL DATA:  Chest pain and cough for several days EXAM: PORTABLE CHEST 1 VIEW COMPARISON:  October 09, 2015 FINDINGS: There is no edema or consolidation. Lungs are somewhat hyperexpanded. Heart size and pulmonary vascularity are within normal limits. No adenopathy. No pneumothorax. No bone lesions. IMPRESSION: Lungs hyperexpanded without edema or consolidation. Electronically Signed   By: Lowella Grip III M.D.   On: 11/26/2015 07:26    Microbiology: No results found for this or any previous visit (from the past 240 hour(s)).   Labs: Basic Metabolic Panel:  Recent Labs Lab 11/24/15 1737 11/26/15 0721 11/27/15 0320  NA 138 141 140  K 2.9* 3.5 3.7  CL 103 103 101  CO2 22  --  26  GLUCOSE 136* 112* 81  BUN 9 12 19   CREATININE 0.73 0.70 0.82  CALCIUM 10.1  --  10.2   Liver Function Tests:  Recent Labs Lab 11/24/15 1737  AST 24  ALT 20  ALKPHOS 73  BILITOT 0.7  PROT 8.2*  ALBUMIN 4.2    Recent Labs Lab 11/24/15 1737  LIPASE 23   No results for input(s): AMMONIA in the last 168 hours. CBC:  Recent Labs Lab 11/24/15 1737 11/26/15 0655 11/26/15 0721  WBC 4.2 4.4  --   NEUTROABS  --  1.9  --   HGB 13.7 14.0 15.3*  HCT 41.2 41.0 45.0  MCV 78.9 79.9  --   PLT 240 210  --    Cardiac Enzymes:  Recent Labs Lab 11/26/15 0655 11/26/15 1201 11/26/15 1916  TROPONINI <0.03 <0.03 <0.03   BNP: BNP (last 3 results)  Recent Labs  06/11/15 0718  BNP 17.5    ProBNP (last 3 results) No results for input(s): PROBNP in the last 8760 hours.  CBG: No results for input(s): GLUCAP in the last 168 hours.     Signed:  Geradine Girt DO.  [facility] 11/27/2015, 3:42 PM

## 2015-11-28 ENCOUNTER — Emergency Department (HOSPITAL_COMMUNITY)
Admission: EM | Admit: 2015-11-28 | Discharge: 2015-11-28 | Disposition: A | Discharge status: Home / Self Care | Payer: Medicare Other | Attending: Emergency Medicine | Admitting: Emergency Medicine

## 2015-11-28 ENCOUNTER — Encounter (HOSPITAL_COMMUNITY): Payer: Self-pay | Admitting: Emergency Medicine

## 2015-11-28 ENCOUNTER — Telehealth: Payer: Self-pay | Admitting: Cardiology

## 2015-11-28 DIAGNOSIS — K5904 Chronic idiopathic constipation: Secondary | ICD-10-CM

## 2015-11-28 DIAGNOSIS — Z7901 Long term (current) use of anticoagulants: Secondary | ICD-10-CM | POA: Diagnosis not present

## 2015-11-28 DIAGNOSIS — J329 Chronic sinusitis, unspecified: Secondary | ICD-10-CM | POA: Diagnosis not present

## 2015-11-28 DIAGNOSIS — K219 Gastro-esophageal reflux disease without esophagitis: Secondary | ICD-10-CM

## 2015-11-28 DIAGNOSIS — Z8639 Personal history of other endocrine, nutritional and metabolic disease: Secondary | ICD-10-CM | POA: Diagnosis not present

## 2015-11-28 DIAGNOSIS — I10 Essential (primary) hypertension: Secondary | ICD-10-CM | POA: Insufficient documentation

## 2015-11-28 DIAGNOSIS — Z79899 Other long term (current) drug therapy: Secondary | ICD-10-CM | POA: Diagnosis not present

## 2015-11-28 DIAGNOSIS — K5909 Other constipation: Secondary | ICD-10-CM | POA: Diagnosis not present

## 2015-11-28 DIAGNOSIS — I4891 Unspecified atrial fibrillation: Secondary | ICD-10-CM | POA: Diagnosis not present

## 2015-11-28 DIAGNOSIS — Z87891 Personal history of nicotine dependence: Secondary | ICD-10-CM | POA: Insufficient documentation

## 2015-11-28 DIAGNOSIS — J45909 Unspecified asthma, uncomplicated: Secondary | ICD-10-CM | POA: Insufficient documentation

## 2015-11-28 DIAGNOSIS — Z7951 Long term (current) use of inhaled steroids: Secondary | ICD-10-CM | POA: Insufficient documentation

## 2015-11-28 DIAGNOSIS — R1013 Epigastric pain: Secondary | ICD-10-CM | POA: Diagnosis present

## 2015-11-28 MED ORDER — OMEPRAZOLE 20 MG PO CPDR: 20.0000 mg | DELAYED_RELEASE_CAPSULE | Freq: Every day | ORAL | Status: DC

## 2015-11-28 MED ORDER — GI COCKTAIL ~~LOC~~
30.0000 mL | Freq: Once | ORAL | Status: AC
  Administered 2015-11-28: 30 mL via ORAL
  Filled 2015-11-28: qty 30

## 2015-11-28 MED ORDER — POLYETHYLENE GLYCOL 3350 17 GM/SCOOP PO POWD: 1.0000 | Freq: Every day | ORAL | Status: DC | PRN

## 2015-11-28 MED ORDER — LORATADINE 10 MG PO TABS: 10.0000 mg | ORAL_TABLET | Freq: Every day | ORAL | Status: DC

## 2015-11-28 NOTE — ED Notes (Addendum)
Pt c/o burning epigastric pain, body aches x 1 week. Pt discharged from hospital yesterday for atrial fibrillation. Pt had epigastric pain evaluated at [facility].

## 2015-11-28 NOTE — Discharge Instructions (Signed)

## 2015-11-28 NOTE — Telephone Encounter (Signed)
No answer when dialed. 

## 2015-11-28 NOTE — ED Provider Notes (Signed)
CSN: [id number]     Arrival date & time 11/28/15  0944 History   First MD Initiated Contact with Patient 11/28/15 1009     Chief Complaint  Patient presents with  . Abdominal Pain     (Consider location/radiation/quality/duration/timing/severity/associated sxs/prior Treatment) Patient is a 65 y.o. female presenting with abdominal pain. The history is provided by the patient.  Abdominal Pain Pain location:  Epigastric Pain quality: burning and throbbing   Pain radiates to:  Does not radiate Pain severity:  Moderate Onset quality:  Gradual Duration:  1 week Timing:  Constant Progression:  Unchanged Chronicity:  Recurrent Context: not alcohol use   Relieved by:  Nothing Exacerbated by: soup. Ineffective treatments: pepcid. Associated symptoms: constipation (last BM yesterday but "I just start pootin' and I can't get anything else out and it might be a gas blockage") and cough (and congestion for 1-2 weeks)   Associated symptoms: no fever and no vomiting     Past Medical History  Diagnosis Date  . Asthma   . Hypertension   . GERD (gastroesophageal reflux disease)   . A-fib ([place])   . Migraine   . Depression   . Acute hypokalemia 11/2015  . Chronic anticoagulation    Past Surgical History  Procedure Laterality Date  . Tubal ligation    . Tonsillectomy     Family History  Problem Relation Age of Onset  . Cancer Mother   . Cancer Father   . Heart attack Mother 34   Social History  Substance Use Topics  . Smoking status: Former Smoker    Types: Cigarettes    Quit date: 10/24/2015  . Smokeless tobacco: Never Used  . Alcohol Use: No   OB History    No data available     Review of Systems  Constitutional: Negative for fever.  Respiratory: Positive for cough (and congestion for 1-2 weeks).   Gastrointestinal: Positive for abdominal pain and constipation (last BM yesterday but "I just start pootin' and I can't get anything else out and it might be a gas blockage").  Negative for vomiting.  All other systems reviewed and are negative.     Allergies  Protonix and Celecoxib  Home Medications   Prior to Admission medications   Medication Sig Start Date End Date Taking? Authorizing Provider  albuterol (PROVENTIL HFA;VENTOLIN HFA) 108 (90 Base) MCG/ACT inhaler Inhale 2 puffs into the lungs every 6 (six) hours as needed for wheezing or shortness of breath.   Yes Historical Provider, MD  albuterol (PROVENTIL) (2.5 MG/3ML) 0.083% nebulizer solution Take 3 mLs (2.5 mg total) by nebulization every 6 (six) hours as needed for wheezing or shortness of breath. 07/07/14  Yes Quintella Reichert, MD  diltiazem (CARDIZEM CD) 240 MG 24 hr capsule Take 1 capsule (240 mg total) by mouth daily. 11/27/15  Yes Geradine Girt, DO  feeding supplement, ENSURE ENLIVE, (ENSURE ENLIVE) LIQD Take 237 mLs by mouth 2 (two) times daily between meals. 06/12/15  Yes Costin Karlyne Greenspan, MD  Fluticasone-Salmeterol (ADVAIR DISKUS) 250-50 MCG/DOSE AEPB Inhale 1 puff into the lungs 2 (two) times daily.   Yes Historical Provider, MD  hydrochlorothiazide (HYDRODIURIL) 25 MG tablet Take 25 mg by mouth daily. 10/31/15  Yes Historical Provider, MD  potassium chloride SA (K-DUR,KLOR-CON) 20 MEQ tablet Take 20 mEq by mouth 2 (two) times daily. 11/12/15  Yes Historical Provider, MD  rivaroxaban (XARELTO) 20 MG TABS tablet Take 1 tablet (20 mg total) by mouth daily with supper. 06/10/15  Yes  Nita Sells, MD  tiotropium (SPIRIVA HANDIHALER) 18 MCG inhalation capsule Place 1 capsule (18 mcg total) into inhaler and inhale daily. 06/22/13  Yes Lacretia Leigh, MD  famotidine (PEPCID) 40 MG tablet Take 1 tablet (40 mg total) by mouth daily. 11/27/15   Geradine Girt, DO  metoprolol tartrate (LOPRESSOR) 25 MG tablet Take 0.5 tablets (12.5 mg total) by mouth 2 (two) times daily. 11/27/15   Geradine Girt, DO   BP 118/83 mmHg  Pulse 71  Temp(Src) 97.5 F (36.4 C) (Oral)  Resp 14  SpO2 99% Physical Exam   Constitutional: She is oriented to person, place, and time. She appears well-developed and well-nourished. No distress.  HENT:  Head: Normocephalic.  Eyes: Conjunctivae are normal.  Neck: Neck supple. No tracheal deviation present.  Cardiovascular: Normal rate, regular rhythm and normal heart sounds.   Pulmonary/Chest: Effort normal. No respiratory distress. She has no wheezes.  Abdominal: Soft. She exhibits no distension. There is tenderness (mild epigastric).  Neurological: She is alert and oriented to person, place, and time.  Skin: Skin is warm and dry.  Psychiatric: She has a normal mood and affect.  Vitals reviewed.   ED Course  Procedures (including critical care time) Labs Review Labs Reviewed - No data to display  Imaging Review Nm Myocar Multi W/spect W/wall Motion / Ef  11/27/2015   No T wave inversion was noted during stress.  There was no ST segment deviation noted during stress.  There is a medium defect of mild severity present in the basal inferoseptal, mid inferoseptal and apical septal location. The defect is non-reversible. This is most consistent with extracardiac activity on both rest and stress. No ischemia noted.  This is a low risk study.  The left ventricular ejection fraction is normal (55-65%).  Nuclear stress EF: 58%.    I have personally reviewed and evaluated these images and lab results as part of my medical decision-making.   EKG Interpretation   Date/Time:  Friday November 28 2015 10:10:22 EDT Ventricular Rate:  68 PR Interval:  128 QRS Duration: 88 QT Interval:  402 QTC Calculation: 427 R Axis:   51 Text Interpretation:  Sinus rhythm No significant change since last  tracing Confirmed by Hisao Doo MD, Quillian Quince [id number]) on 11/28/2015 10:11:57 AM      MDM   Final diagnoses:  Rhinosinusitis  Gastroesophageal reflux disease, esophagitis presence not specified  Functional constipation    65 y.o. female presents with Multiple complaints mostly  surrounding upper abdominal pain and burning sensation that she had throughout her hospitalization during the last 2 days. She was discharged yesterday after a negative stress test. She states that she has not been having satisfying bowel movements and feels like she may be having extra gas. Her abdominal exam is benign, she is well-appearing, she is not in atrial fibrillation as she was during her previous admission. She has ongoing issues with cough for the last 2 weeks but no signs of prolonged infection or adventitious lung sounds. I recommended supportive care for possible gastritis or constipation including addition of a PPI to her regimen as well as MiraLAX as needed. She has a documented allergy to Protonix but not omeprazole. She has a tertiary complaint of rhinitis-like symptoms so we will add Claritin to her regimen as well. She will the primary care physician follow-up for continuity of care and medication reconciliation.  Leo Grosser, MD 11/28/15 1048

## 2015-11-28 NOTE — Telephone Encounter (Signed)
Spoke with pt, she is not sure who called or why.do not see any results or appts to give the pt. Will make cheryl aware she called and have her call the pt back next week if needed. Pt agreed with this plan.

## 2015-11-28 NOTE — Telephone Encounter (Signed)
Follow Up  ° °Pt returned the call  °

## 2015-12-01 ENCOUNTER — Telehealth: Payer: Self-pay | Admitting: *Deleted

## 2015-12-01 NOTE — Telephone Encounter (Signed)
Called and spoke with patient to schedule post hospital visit--patient stated she would need to call me back.

## 2015-12-02 ENCOUNTER — Emergency Department (HOSPITAL_COMMUNITY)
Admission: EM | Admit: 2015-12-02 | Discharge: 2015-12-02 | Disposition: A | Discharge status: Home / Self Care | Payer: Medicare Other | Attending: Emergency Medicine | Admitting: Emergency Medicine

## 2015-12-02 ENCOUNTER — Encounter (HOSPITAL_COMMUNITY): Payer: Self-pay

## 2015-12-02 DIAGNOSIS — J45909 Unspecified asthma, uncomplicated: Secondary | ICD-10-CM | POA: Insufficient documentation

## 2015-12-02 DIAGNOSIS — I480 Paroxysmal atrial fibrillation: Secondary | ICD-10-CM | POA: Insufficient documentation

## 2015-12-02 DIAGNOSIS — E876 Hypokalemia: Secondary | ICD-10-CM | POA: Diagnosis not present

## 2015-12-02 DIAGNOSIS — Z8659 Personal history of other mental and behavioral disorders: Secondary | ICD-10-CM | POA: Diagnosis not present

## 2015-12-02 DIAGNOSIS — K219 Gastro-esophageal reflux disease without esophagitis: Secondary | ICD-10-CM | POA: Insufficient documentation

## 2015-12-02 DIAGNOSIS — R42 Dizziness and giddiness: Secondary | ICD-10-CM | POA: Diagnosis not present

## 2015-12-02 DIAGNOSIS — I10 Essential (primary) hypertension: Secondary | ICD-10-CM | POA: Diagnosis not present

## 2015-12-02 DIAGNOSIS — Z79899 Other long term (current) drug therapy: Secondary | ICD-10-CM | POA: Diagnosis not present

## 2015-12-02 DIAGNOSIS — Z7951 Long term (current) use of inhaled steroids: Secondary | ICD-10-CM | POA: Diagnosis not present

## 2015-12-02 DIAGNOSIS — Z7901 Long term (current) use of anticoagulants: Secondary | ICD-10-CM | POA: Insufficient documentation

## 2015-12-02 DIAGNOSIS — Z87891 Personal history of nicotine dependence: Secondary | ICD-10-CM | POA: Insufficient documentation

## 2015-12-02 DIAGNOSIS — R002 Palpitations: Secondary | ICD-10-CM | POA: Diagnosis present

## 2015-12-02 MED ORDER — DILTIAZEM HCL ER COATED BEADS 240 MG PO CP24: 240.0000 mg | ORAL_CAPSULE | Freq: Every day | ORAL | Status: DC

## 2015-12-02 MED ORDER — DILTIAZEM HCL ER COATED BEADS 120 MG PO CP24: 120.0000 mg | ORAL_CAPSULE | Freq: Every day | ORAL | Status: DC

## 2015-12-02 NOTE — Telephone Encounter (Signed)
Returned call to patient no answer.Left message on personal voice mail I did not call you,when reviewing your chart post hospital appointment scheduled with Ignacia Bayley PA 12/11/15 at 11:00 am at [place] office.

## 2015-12-02 NOTE — Discharge Instructions (Signed)
PLEASE STOP THE METOPROLOL UNTIL FURTHER EVALUATION BY YOUR CARDIOLOGIST.   Atrial Fibrillation Atrial fibrillation is a type of irregular or rapid heartbeat (arrhythmia). In atrial fibrillation, the heart quivers continuously in a chaotic pattern. This occurs when parts of the heart receive disorganized signals that make the heart unable to pump blood normally. This can increase the risk for stroke, heart failure, and other heart-related conditions. There are different types of atrial fibrillation, including:  Paroxysmal atrial fibrillation. This type starts suddenly, and it usually stops on its own shortly after it starts.  Persistent atrial fibrillation. This type often lasts longer than a week. It may stop on its own or with treatment.  Long-lasting persistent atrial fibrillation. This type lasts longer than 12 months.  Permanent atrial fibrillation. This type does not go away. Talk with your health care provider to learn about the type of atrial fibrillation that you have. CAUSES This condition is caused by some heart-related conditions or procedures, including:  A heart attack.  Coronary artery disease.  Heart failure.  Heart valve conditions.  High blood pressure.  Inflammation of the sac that surrounds the heart (pericarditis).  Heart surgery.  Certain heart rhythm disorders, such as Wolf-Parkinson-White syndrome. Other causes include:  Pneumonia.  Obstructive sleep apnea.  Blockage of an artery in the lungs (pulmonary embolism, or PE).  Lung cancer.  Chronic lung disease.  Thyroid problems, especially if the thyroid is overactive (hyperthyroidism).  Caffeine.  Excessive alcohol use or illegal drug use.  Use of some medicines, including certain decongestants and diet pills. Sometimes, the cause cannot be found. RISK FACTORS This condition is more likely to develop in:  People who are older in age.  People who smoke.  People who have diabetes  mellitus.  People who are overweight (obese).  Athletes who exercise vigorously. SYMPTOMS Symptoms of this condition include:  A feeling that your heart is beating rapidly or irregularly.  A feeling of discomfort or pain in your chest.  Shortness of breath.  Sudden light-headedness or weakness.  Getting tired easily during exercise. In some cases, there are no symptoms. DIAGNOSIS Your health care provider may be able to detect atrial fibrillation when taking your pulse. If detected, this condition may be diagnosed with:  An electrocardiogram (ECG).  A Holter monitor test that records your heartbeat patterns over a 24-hour period.  Transthoracic echocardiogram (TTE) to evaluate how blood flows through your heart.  Transesophageal echocardiogram (TEE) to view more detailed images of your heart.  A stress test.  Imaging tests, such as a CT scan or chest X-ray.  Blood tests. TREATMENT The main goals of treatment are to prevent blood clots from forming and to keep your heart beating at a normal rate and rhythm. The type of treatment that you receive depends on many factors, such as your underlying medical conditions and how you feel when you are experiencing atrial fibrillation. This condition may be treated with:  Medicine to slow down the heart rate, bring the heart's rhythm back to normal, or prevent clots from forming.  Electrical cardioversion. This is a procedure that resets your heart's rhythm by delivering a controlled, low-energy shock to the heart through your skin.  Different types of ablation, such as catheter ablation, catheter ablation with pacemaker, or surgical ablation. These procedures destroy the heart tissues that send abnormal signals. When the pacemaker is used, it is placed under your skin to help your heart beat in a regular rhythm. HOME CARE INSTRUCTIONS  Take over-the counter  and prescription medicines only as told by your health care provider.  If  your health care provider prescribed a blood-thinning medicine (anticoagulant), take it exactly as told. Taking too much blood-thinning medicine can cause bleeding. If you do not take enough blood-thinning medicine, you will not have the protection that you need against stroke and other problems.  Do not use tobacco products, including cigarettes, chewing tobacco, and e-cigarettes. If you need help quitting, ask your health care provider.  If you have obstructive sleep apnea, manage your condition as told by your health care provider.  Do not drink alcohol.  Do not drink beverages that contain caffeine, such as coffee, soda, and tea.  Maintain a healthy weight. Do not use diet pills unless your health care provider approves. Diet pills may make heart problems worse.  Follow diet instructions as told by your health care provider.  Exercise regularly as told by your health care provider.  Keep all follow-up visits as told by your health care provider. This is important. PREVENTION  Avoid drinking beverages that contain caffeine or alcohol.  Avoid certain medicines, especially medicines that are used for breathing problems.  Avoid certain herbs and herbal medicines, such as those that contain ephedra or ginseng.  Do not use illegal drugs, such as cocaine and amphetamines.  Do not smoke.  Manage your high blood pressure. SEEK MEDICAL CARE IF:  You notice a change in the rate, rhythm, or strength of your heartbeat.  You are taking an anticoagulant and you notice increased bruising.  You tire more easily when you exercise or exert yourself. SEEK IMMEDIATE MEDICAL CARE IF:  You have chest pain, abdominal pain, sweating, or weakness.  You feel nauseous.  You notice blood in your vomit, bowel movement, or urine.  You have shortness of breath.  You suddenly have swollen feet and ankles.  You feel dizzy.  You have sudden weakness or numbness of the face, arm, or leg,  especially on one side of the body.  You have trouble speaking, trouble understanding, or both (aphasia).  Your face or your eyelid droops on one side. These symptoms may represent a serious problem that is an emergency. Do not wait to see if the symptoms will go away. Get medical help right away. Call your local emergency services (911 in the [place] Do not drive yourself to the hospital.   This information is not intended to replace advice given to you by your health care provider. Make sure you discuss any questions you have with your health care provider.   Document Released: 08/09/2005 Document Revised: 04/30/2015 Document Reviewed: 12/04/2014 Elsevier Interactive Patient Education 2016 [employer] Diltiazem extended-release capsules or tablets What is this medicine? DILTIAZEM (dil TYE a zem) is a calcium-channel blocker. It affects the amount of calcium found in your heart and muscle cells. This relaxes your blood vessels, which can reduce the amount of work the heart has to do. This medicine is used to treat high blood pressure and chest pain caused by angina. This medicine may be used for other purposes; ask your health care provider or pharmacist if you have questions. What should I tell my health care provider before I take this medicine? They need to know if you have any of these conditions: -heart problems, low blood pressure, irregular heartbeat -liver disease -previous heart attack -an unusual or allergic reaction to diltiazem, other medicines, foods, dyes, or preservatives -pregnant or trying to get pregnant -breast-feeding How should I use this medicine? Take this  medicine by mouth with a glass of water. Follow the directions on the prescription label. Swallow whole, do not crush or chew. Ask your doctor or pharmacist if your should take this medicine with food. Take your doses at regular intervals. Do not take your medicine more often then directed. Do not stop taking  except on the advice of your doctor or health care professional. Ask your doctor or health care professional how to gradually reduce the dose. Talk to your pediatrician regarding the use of this medicine in children. Special care may be needed. Overdosage: If you think you have taken too much of this medicine contact a poison control center or emergency room at once. NOTE: This medicine is only for you. Do not share this medicine with others. What if I miss a dose? If you miss a dose, take it as soon as you can. If it is almost time for your next dose, take only that dose. Do not take double or extra doses. What may interact with this medicine? Do not take this medicine with any of the following medications: -cisapride -hawthorn -pimozide -ranolazine -red yeast rice This medicine may also interact with the following medications: -buspirone -carbamazepine -cimetidine -cyclosporine -digoxin -local anesthetics or general anesthetics -lovastatin -medicines for anxiety or difficulty sleeping like midazolam and triazolam -medicines for high blood pressure or heart problems -quinidine -rifampin, rifabutin, or rifapentine This list may not describe all possible interactions. Give your health care provider a list of all the medicines, herbs, non-prescription drugs, or dietary supplements you use. Also tell them if you smoke, drink alcohol, or use illegal drugs. Some items may interact with your medicine. What should I watch for while using this medicine? Check your blood pressure and pulse rate regularly. Ask your doctor or health care professional what your blood pressure and pulse rate should be and when you should contact him or her. You may feel dizzy or lightheaded. Do not drive, use machinery, or do anything that needs mental alertness until you know how this medicine affects you. To reduce the risk of dizzy or fainting spells, do not sit or stand up quickly, especially if you are an older  patient. Alcohol can make you more dizzy or increase flushing and rapid heartbeats. Avoid alcoholic drinks. What side effects may I notice from receiving this medicine? Side effects that you should report to your doctor or health care professional as soon as possible: -allergic reactions like skin rash, itching or hives, swelling of the face, lips, or tongue -confusion, mental depression -feeling faint or lightheaded, falls -redness, blistering, peeling or loosening of the skin, including inside the mouth -slow, irregular heartbeat -swelling of the feet and ankles -unusual bleeding or bruising, pinpoint red spots on the skin Side effects that usually do not require medical attention (report to your doctor or health care professional if they continue or are bothersome): -constipation or diarrhea -difficulty sleeping -facial flushing -headache -nausea, vomiting -sexual dysfunction -weak or tired This list may not describe all possible side effects. Call your doctor for medical advice about side effects. You may report side effects to FDA at 1-800-FDA-1088. Where should I keep my medicine? Keep out of the reach of children. Store at room temperature between 15 and 30 degrees C (59 and 86 degrees F). Protect from humidity. Throw away any unused medicine after the expiration date. NOTE: This sheet is a summary. It may not cover all possible information. If you have questions about this medicine, talk to your doctor, pharmacist,  or health care provider.    2016, Elsevier/Gold Standard. (2007-11-30 14:35:47)

## 2015-12-02 NOTE — ED Provider Notes (Signed)
CSN: [id number]     Arrival date & time 12/02/15  0948 History   First MD Initiated Contact with Patient 12/02/15 [id number]     Chief Complaint  Patient presents with  . Palpitations     (Consider location/radiation/quality/duration/timing/severity/associated sxs/prior Treatment) HPI Comments: 65 y/o female with PMHx significant for atrial fibrillation presents with palpitations. Pt reports they began around 730A, lasted for 10 minutes without associated syncopal episode, chest pain, dizziness, dib. Pt reports no palpitations at this time. Pt was hospitalized this past weekend with similar symptoms and was prescribed metoprolol to further control her HR, which she last took yesterday evening with associated dizziness. She is appropriately anticoagulated and is adherent to her rhythm controlling Cardizem. Pt denies associated CP, SOB, cough, signs/symptoms of infection. Pt admits she has increased stress in her life as she is caring for a friend with cancer and that she has experienced a few "anxiety attacks" so far this year. Uses Serevent inhaler BID. Notes quit smoking x1 month.   Of note, her last admission resulted in stating 12.5 mg metoprolol in addition to her cardiazem. She reports that ever since she started the new medication, she has been having increased dizzy spell even when she is not having palpations.   ROS 10 Systems reviewed and are negative for acute change except as noted in the HPI.       The history is provided by the patient and medical records.    Past Medical History  Diagnosis Date  . Asthma   . Hypertension   . GERD (gastroesophageal reflux disease)   . A-fib ([place])   . Migraine   . Depression   . Acute hypokalemia 11/2015  . Chronic anticoagulation    Past Surgical History  Procedure Laterality Date  . Tubal ligation    . Tonsillectomy     Family History  Problem Relation Age of Onset  . Cancer Mother   . Cancer Father   . Heart attack Mother 68    Social History  Substance Use Topics  . Smoking status: Former Smoker    Types: Cigarettes    Quit date: 10/24/2015  . Smokeless tobacco: Never Used  . Alcohol Use: No   OB History    No data available     Review of Systems    Allergies  Protonix and Celecoxib  Home Medications   Prior to Admission medications   Medication Sig Start Date End Date Taking? Authorizing Provider  albuterol (PROVENTIL HFA;VENTOLIN HFA) 108 (90 Base) MCG/ACT inhaler Inhale 2 puffs into the lungs every 6 (six) hours as needed for wheezing or shortness of breath.   Yes Historical Provider, MD  albuterol (PROVENTIL) (2.5 MG/3ML) 0.083% nebulizer solution Take 3 mLs (2.5 mg total) by nebulization every 6 (six) hours as needed for wheezing or shortness of breath. 07/07/14  Yes Quintella Reichert, MD  famotidine (PEPCID) 40 MG tablet Take 1 tablet (40 mg total) by mouth daily. 11/27/15  Yes Geradine Girt, DO  feeding supplement, ENSURE ENLIVE, (ENSURE ENLIVE) LIQD Take 237 mLs by mouth 2 (two) times daily between meals. 06/12/15  Yes Costin Karlyne Greenspan, MD  Fluticasone-Salmeterol (ADVAIR DISKUS) 250-50 MCG/DOSE AEPB Inhale 1 puff into the lungs 2 (two) times daily.   Yes Historical Provider, MD  hydrochlorothiazide (HYDRODIURIL) 25 MG tablet Take 25 mg by mouth daily. 10/31/15  Yes Historical Provider, MD  loratadine (CLARITIN) 10 MG tablet Take 1 tablet (10 mg total) by mouth daily. 11/28/15  Yes Leo Grosser,  MD  omeprazole (PRILOSEC) 20 MG capsule Take 1 capsule (20 mg total) by mouth daily. 11/28/15  Yes Leo Grosser, MD  polyethylene glycol powder (MIRALAX) powder Take 255 g by mouth daily as needed for mild constipation. 11/28/15  Yes Leo Grosser, MD  potassium chloride SA (K-DUR,KLOR-CON) 20 MEQ tablet Take 20 mEq by mouth 2 (two) times daily. 11/12/15  Yes Historical Provider, MD  rivaroxaban (XARELTO) 20 MG TABS tablet Take 1 tablet (20 mg total) by mouth daily with supper. 06/10/15  Yes Nita Sells, MD   tiotropium (SPIRIVA HANDIHALER) 18 MCG inhalation capsule Place 1 capsule (18 mcg total) into inhaler and inhale daily. 06/22/13  Yes Lacretia Leigh, MD  diltiazem (CARDIZEM CD) 240 MG 24 hr capsule Take 1 capsule (240 mg total) by mouth daily. 12/02/15   Alegria Dominique, MD   BP 138/80 mmHg  Pulse 56  Temp(Src) 98.1 F (36.7 C) (Oral)  Resp 15  SpO2 99% Physical Exam  Constitutional: She is oriented to person, place, and time. She appears well-developed.  HENT:  Head: Normocephalic and atraumatic.  Eyes: Conjunctivae and EOM are normal. Pupils are equal, round, and reactive to light.  Neck: Normal range of motion. Neck supple. No JVD present.  Cardiovascular: Normal rate and normal heart sounds.   Irregularly irregular  Pulmonary/Chest: Effort normal and breath sounds normal. No respiratory distress.  Abdominal: Soft. Bowel sounds are normal. She exhibits no distension. There is no tenderness. There is no rebound and no guarding.  Musculoskeletal: She exhibits no edema.  Neurological: She is alert and oriented to person, place, and time.  Skin: Skin is warm and dry.  Nursing note and vitals reviewed.   ED Course  Procedures (including critical care time) Labs Review Labs Reviewed - No data to display  Imaging Review No results found. I have personally reviewed and evaluated these images and lab results as part of my medical decision-making.   EKG Interpretation   Date/Time:  Tuesday December 02 2015 09:54:24 EDT Ventricular Rate:  70 PR Interval:  141 QRS Duration: 104 QT Interval:  414 QTC Calculation: 447 R Axis:   71 Text Interpretation:  Sinus rhythm Right atrial enlargement RSR' in V1 or  V2, probably normal variant Minimal ST depression Baseline wander in  lead(s) II III aVL aVF V1 V3 V4 Confirmed by Kathrynn Humble, MD, Thelma Comp [phone])  on 12/02/2015 12:16:22 PM      MDM   Final diagnoses:  PAF (paroxysmal atrial fibrillation) ([place])    DDx:  Cardiac arrhthymias:  atrial flutter/fibrillation, SVT  Medication induced bradycardia leading to dizziness Anxiety  Medication-induced palpations (albuterol inhalers)   MDM:  Pt is a 65 y/o female with CC heart racing. Pt has PMHx of atrial fibrillation and was recently hospitalized for similar symptoms. Her episode lasted for 10 minutes - no associated chest pain, dib, dizziness, near fainting. She has no palpitations right now and her HR is in the 70s during our evaluation.  Interestingly, she also reports episodes of dizziness, w/o fainting/near fainting that have occurred since starting metoprolol. I quickly consulted Dr. Carleene Overlie over the phone - and he is agreeing with the plan to halt metoprolol for now. Pt is to see her EP on Thursday - so i have asked her to stop the metop for now, and ask them if the meds need to be continued.  Strict ER return precautions have been discussed, and patient is agreeing with the plan and is comfortable with the workup done and the recommendations  from the ER.    Varney Biles, MD 12/02/15 1349

## 2015-12-02 NOTE — ED Notes (Signed)
Pt. Presents with complaint of heart racing this AM. Pt. States she took prescribed medications this AM and states has not been feeling right since. Pt. Ambulatory, AxO x4.

## 2015-12-11 ENCOUNTER — Ambulatory Visit: Payer: Medicare Other | Admitting: Nurse Practitioner

## 2016-01-13 ENCOUNTER — Encounter (HOSPITAL_COMMUNITY): Payer: Self-pay | Admitting: Adult Health

## 2016-01-13 ENCOUNTER — Other Ambulatory Visit: Payer: Self-pay

## 2016-01-13 ENCOUNTER — Emergency Department (HOSPITAL_COMMUNITY)
Admission: EM | Admit: 2016-01-13 | Discharge: 2016-01-13 | Disposition: A | Discharge status: Home / Self Care | Payer: Medicare Other | Attending: Emergency Medicine | Admitting: Emergency Medicine

## 2016-01-13 DIAGNOSIS — I480 Paroxysmal atrial fibrillation: Secondary | ICD-10-CM | POA: Diagnosis not present

## 2016-01-13 DIAGNOSIS — J45909 Unspecified asthma, uncomplicated: Secondary | ICD-10-CM | POA: Insufficient documentation

## 2016-01-13 DIAGNOSIS — R55 Syncope and collapse: Secondary | ICD-10-CM | POA: Insufficient documentation

## 2016-01-13 DIAGNOSIS — R002 Palpitations: Secondary | ICD-10-CM | POA: Diagnosis present

## 2016-01-13 DIAGNOSIS — Z79899 Other long term (current) drug therapy: Secondary | ICD-10-CM | POA: Insufficient documentation

## 2016-01-13 DIAGNOSIS — Z8659 Personal history of other mental and behavioral disorders: Secondary | ICD-10-CM | POA: Diagnosis not present

## 2016-01-13 DIAGNOSIS — Z7901 Long term (current) use of anticoagulants: Secondary | ICD-10-CM | POA: Insufficient documentation

## 2016-01-13 DIAGNOSIS — K219 Gastro-esophageal reflux disease without esophagitis: Secondary | ICD-10-CM | POA: Diagnosis not present

## 2016-01-13 DIAGNOSIS — E876 Hypokalemia: Secondary | ICD-10-CM | POA: Insufficient documentation

## 2016-01-13 DIAGNOSIS — I10 Essential (primary) hypertension: Secondary | ICD-10-CM | POA: Diagnosis not present

## 2016-01-13 DIAGNOSIS — Z8669 Personal history of other diseases of the nervous system and sense organs: Secondary | ICD-10-CM | POA: Insufficient documentation

## 2016-01-13 DIAGNOSIS — Z87891 Personal history of nicotine dependence: Secondary | ICD-10-CM | POA: Diagnosis not present

## 2016-01-13 LAB — CBC
HEMATOCRIT: 37.7 % (ref 36.0–46.0)
Hemoglobin: 12.5 g/dL (ref 12.0–15.0)
MCH: 26.3 pg (ref 26.0–34.0)
MCHC: 33.2 g/dL (ref 30.0–36.0)
MCV: 79.2 fL (ref 78.0–100.0)
PLATELETS: 213 10*3/uL (ref 150–400)
RBC: 4.76 MIL/uL (ref 3.87–5.11)
RDW: 15.6 % — AB (ref 11.5–15.5)
WBC: 5.1 10*3/uL (ref 4.0–10.5)

## 2016-01-13 LAB — BASIC METABOLIC PANEL
Anion gap: 9 (ref 5–15)
BUN: 12 mg/dL (ref 6–20)
CO2: 27 mmol/L (ref 22–32)
Calcium: 10.6 mg/dL — ABNORMAL HIGH (ref 8.9–10.3)
Chloride: 105 mmol/L (ref 101–111)
Creatinine, Ser: 0.86 mg/dL (ref 0.44–1.00)
GFR calc Af Amer: >60 mL/min (ref >60)
GLUCOSE: 110 mg/dL — AB (ref 65–99)
POTASSIUM: 3.7 mmol/L (ref 3.5–5.1)
SODIUM: 141 mmol/L (ref 135–145)

## 2016-01-13 LAB — I-STAT TROPONIN, ED: Troponin i, poc: 0 ng/mL (ref 0.00–0.08)

## 2016-01-13 MED ORDER — BENZONATATE 100 MG PO CAPS: 100.0000 mg | ORAL_CAPSULE | Freq: Three times a day (TID) | ORAL | Status: DC

## 2016-01-13 NOTE — ED Notes (Signed)
MD at bedside. 

## 2016-01-13 NOTE — ED Provider Notes (Addendum)
CSN: [id number]     Arrival date & time 01/13/16  1515 History   First MD Initiated Contact with Patient 01/13/16 1605     Chief Complaint  Patient presents with  . Tachycardia     (Consider location/radiation/quality/duration/timing/severity/associated sxs/prior Treatment) HPI  Pt presenting with c/o near syncope, fast heart rate/palpitations.  She has hx of paroxysmal Afib, HTN, asthma, gerd.  She has had these symptoms in the past, but today lightheadedness and near syncope became worse.  She describes after standing feeling tunnel vision and high heart rate.  She has hx of afib- on xarelto.  Since last ED visit she has been holding on her metoproplol due to symptoms of dizziness.  No chest pain.  She was recently admitted for rapid afib and converted to NSR with diltiazem. No leg swelling.  No difficulty breathing.  There are no other associated systemic symptoms, there are no other alleviating or modifying factors.     Past Medical History  Diagnosis Date  . Asthma   . Hypertension   . GERD (gastroesophageal reflux disease)   . A-fib ([place])   . Migraine   . Depression   . Acute hypokalemia 11/2015  . Chronic anticoagulation    Past Surgical History  Procedure Laterality Date  . Tubal ligation    . Tonsillectomy     Family History  Problem Relation Age of Onset  . Cancer Mother   . Cancer Father   . Heart attack Mother 38   Social History  Substance Use Topics  . Smoking status: Former Smoker    Types: Cigarettes    Quit date: 10/24/2015  . Smokeless tobacco: Never Used  . Alcohol Use: No   OB History    No data available     Review of Systems  ROS reviewed and all otherwise negative except for mentioned in HPI    Allergies  Protonix and Celecoxib  Home Medications   Prior to Admission medications   Medication Sig Start Date End Date Taking? Authorizing Provider  albuterol (PROVENTIL HFA;VENTOLIN HFA) 108 (90 Base) MCG/ACT inhaler Inhale 2 puffs into the  lungs every 6 (six) hours as needed for wheezing or shortness of breath.   Yes Historical Provider, MD  albuterol (PROVENTIL) (2.5 MG/3ML) 0.083% nebulizer solution Take 3 mLs (2.5 mg total) by nebulization every 6 (six) hours as needed for wheezing or shortness of breath. 07/07/14  Yes Quintella Reichert, MD  diltiazem (CARDIZEM CD) 240 MG 24 hr capsule Take 1 capsule (240 mg total) by mouth daily. 12/02/15  Yes Varney Biles, MD  hydrochlorothiazide (HYDRODIURIL) 25 MG tablet Take 25 mg by mouth daily. 10/31/15  Yes Historical Provider, MD  loratadine (CLARITIN) 10 MG tablet Take 1 tablet (10 mg total) by mouth daily. Patient taking differently: Take 10 mg by mouth daily as needed for allergies.  11/28/15  Yes Leo Grosser, MD  omeprazole (PRILOSEC) 20 MG capsule Take 1 capsule (20 mg total) by mouth daily. 11/28/15  Yes Leo Grosser, MD  polyethylene glycol powder (MIRALAX) powder Take 255 g by mouth daily as needed for mild constipation. 11/28/15  Yes Leo Grosser, MD  potassium chloride SA (K-DUR,KLOR-CON) 20 MEQ tablet Take 20 mEq by mouth 2 (two) times daily. 11/12/15  Yes Historical Provider, MD  rivaroxaban (XARELTO) 20 MG TABS tablet Take 1 tablet (20 mg total) by mouth daily with supper. 06/10/15  Yes Nita Sells, MD  tiotropium (SPIRIVA HANDIHALER) 18 MCG inhalation capsule Place 1 capsule (18 mcg total) into  inhaler and inhale daily. 06/22/13  Yes Lacretia Leigh, MD  benzonatate (TESSALON) 100 MG capsule Take 1 capsule (100 mg total) by mouth every 8 (eight) hours. 01/13/16   Alfonzo Beers, MD   BP 113/86 mmHg  Pulse 60  Temp(Src) 97.8 F (36.6 C) (Oral)  Resp 20  SpO2 98%  Vitals reviewed Physical Exam  Physical Examination: General appearance - alert, well appearing, and in no distress Mental status - alert, oriented to person, place, and time Eyes - no conjunctival injection no scleral icterus Mouth - mucous membranes moist, pharynx normal without lesions Chest - clear to  auscultation, no wheezes, rales or rhonchi, symmetric air entry Heart - normal rate, regular rhythm, normal S1, S2, no murmurs, rubs, clicks or gallops Abdomen - soft, nontender, nondistended, no masses or organomegaly Neurological - alert, oriented, normal speech Extremities - peripheral pulses normal, no pedal edema, no clubbing or cyanosis Skin - normal coloration and turgor, no rashes  ED Course  Procedures (including critical care time) Labs Review Labs Reviewed  BASIC METABOLIC PANEL - Abnormal; Notable for the following:    Glucose, Bld 110 (*)    Calcium 10.6 (*)    All other components within normal limits  CBC - Abnormal; Notable for the following:    RDW 15.6 (*)    All other components within normal limits  I-STAT TROPOININ, ED    Imaging Review No results found. I have personally reviewed and evaluated these images and lab results as part of my medical decision-making.   EKG Interpretation   Date/Time:  Tuesday Jan 13 2016 18:32:11 EDT Ventricular Rate:  63 PR Interval:  158 QRS Duration: 90 QT Interval:  404 QTC Calculation: 413 R Axis:   64 Text Interpretation:  Sinus rhythm Consider right atrial enlargement  Minimal ST elevation, anterior leads Since previous tracing pt is back in  sinus rhythm Confirmed by Canary Brim  MD, [place] [id number]) on 01/13/2016 6:39:24  PM      MDM   Final diagnoses:  Paroxysmal atrial fibrillation (HCC)    Pt presenting with c/o palpitations- near syncope- on arrival she was in afib with RVR- however on recheck she is in sinus rhythm- she has remained in sinus rhythm over approx 2 hours in the ED.  D/w cardiology who recommends close outpatient followup.  Pt also c/o cough and requests some medication for this- she has clears lungs, no tachypnea or hypoxia to require imaging.  Discharged with strict return precautions.  Pt agreeable with plan.  This patients CHA2DS2-VASc Score and unadjusted Ischemic Stroke Rate (% per year) is  equal to 2.2 % stroke rate/year from a score of 2  Above score calculated as 1 point each if present [CHF, HTN, DM, Vascular=MI/PAD/Aortic Plaque, Age if 65-74, or Female] Above score calculated as 2 points each if present [Age > 75, or Stroke/TIA/TE]   5:32 PM d/w Dr. Gwenlyn Found, cardiology- he has taken her name and will arrange for close office followup as an outpatient.   Alfonzo Beers, MD 01/13/16 [place], MD 01/19/16 [phone]

## 2016-01-13 NOTE — Discharge Instructions (Signed)
Return to the ED with any concerns including chest pain, fainting, difficulty breathing, leg swelling, decreased level of alertness/lethargy, or any other alarming symptoms

## 2016-01-13 NOTE — ED Notes (Addendum)
Presents with intermittent bouts of nauseas, near syncope and palpitations associated with fatigue-HR is 82-160, endorses SOB, chest pain that began today. She is alert, oriented, BP 97/72. She took a heart medication this am to try and help it but she does not know the name of the meication and she does not know her cardiac histroy but states she has a heart problem, history shows Afib.

## 2016-01-14 ENCOUNTER — Telehealth: Payer: Self-pay | Admitting: Cardiology

## 2016-01-14 NOTE — Telephone Encounter (Signed)
Patient contacted regarding discharge from [facility] on 01/14/16.  Patient understands to follow up with provider kilroy on 01/27/16 at 9 am at [facility]. Patient understands discharge instructions? yes  Patient understands medications and regiment? yes  Patient understands to bring all medications to this visit? yes

## 2016-01-14 NOTE — Telephone Encounter (Signed)
New message      Not sure if this is a TOC appt or not.  The staff message said ER follow up so I am sending it as a TOC appt.  Appt made on 01-27-16 with Kerin Ransom.

## 2016-01-22 ENCOUNTER — Emergency Department (HOSPITAL_COMMUNITY): Payer: Medicare Other

## 2016-01-22 ENCOUNTER — Encounter (HOSPITAL_COMMUNITY): Payer: Self-pay

## 2016-01-22 ENCOUNTER — Emergency Department (HOSPITAL_COMMUNITY)
Admission: EM | Admit: 2016-01-22 | Discharge: 2016-01-22 | Disposition: A | Discharge status: Home / Self Care | Payer: Medicare Other | Attending: Emergency Medicine | Admitting: Emergency Medicine

## 2016-01-22 DIAGNOSIS — R002 Palpitations: Secondary | ICD-10-CM

## 2016-01-22 DIAGNOSIS — Z87891 Personal history of nicotine dependence: Secondary | ICD-10-CM | POA: Insufficient documentation

## 2016-01-22 DIAGNOSIS — Z7901 Long term (current) use of anticoagulants: Secondary | ICD-10-CM | POA: Diagnosis not present

## 2016-01-22 DIAGNOSIS — Z79899 Other long term (current) drug therapy: Secondary | ICD-10-CM | POA: Diagnosis not present

## 2016-01-22 DIAGNOSIS — R062 Wheezing: Secondary | ICD-10-CM | POA: Diagnosis not present

## 2016-01-22 DIAGNOSIS — I10 Essential (primary) hypertension: Secondary | ICD-10-CM | POA: Diagnosis not present

## 2016-01-22 DIAGNOSIS — R0602 Shortness of breath: Secondary | ICD-10-CM | POA: Diagnosis not present

## 2016-01-22 LAB — CBC
HEMATOCRIT: 37.9 % (ref 36.0–46.0)
HEMOGLOBIN: 12.2 g/dL (ref 12.0–15.0)
MCH: 26.2 pg (ref 26.0–34.0)
MCHC: 32.2 g/dL (ref 30.0–36.0)
MCV: 81.3 fL (ref 78.0–100.0)
Platelets: 291 10*3/uL (ref 150–400)
RBC: 4.66 MIL/uL (ref 3.87–5.11)
RDW: 16 % — ABNORMAL HIGH (ref 11.5–15.5)
WBC: 4.8 10*3/uL (ref 4.0–10.5)

## 2016-01-22 LAB — I-STAT TROPONIN, ED: Troponin i, poc: 0 ng/mL (ref 0.00–0.08)

## 2016-01-22 LAB — BASIC METABOLIC PANEL
Anion gap: 7 (ref 5–15)
BUN: 12 mg/dL (ref 6–20)
CALCIUM: 10.2 mg/dL (ref 8.9–10.3)
CHLORIDE: 106 mmol/L (ref 101–111)
CO2: 27 mmol/L (ref 22–32)
CREATININE: 0.86 mg/dL (ref 0.44–1.00)
GFR calc non Af Amer: >60 mL/min (ref >60)
Glucose, Bld: 100 mg/dL — ABNORMAL HIGH (ref 65–99)
Potassium: 3.5 mmol/L (ref 3.5–5.1)
Sodium: 140 mmol/L (ref 135–145)

## 2016-01-22 LAB — TSH: TSH: 3.34 u[IU]/mL (ref 0.350–4.500)

## 2016-01-22 LAB — T4, FREE: FREE T4: 0.77 ng/dL (ref 0.61–1.12)

## 2016-01-22 MED ORDER — CARRINGTON MOISTURE BARRIER EX CREA: TOPICAL_CREAM | CUTANEOUS | Status: DC | PRN

## 2016-01-22 MED ORDER — ALBUTEROL SULFATE HFA 108 (90 BASE) MCG/ACT IN AERS
2.0000 | INHALATION_SPRAY | Freq: Four times a day (QID) | RESPIRATORY_TRACT | Status: DC
Start: 2016-01-22 — End: 2016-01-22
  Administered 2016-01-22: 2 via RESPIRATORY_TRACT
  Filled 2016-01-22: qty 6.7

## 2016-01-22 MED ORDER — ALBUTEROL SULFATE (2.5 MG/3ML) 0.083% IN NEBU
5.0000 mg | INHALATION_SOLUTION | Freq: Once | RESPIRATORY_TRACT | Status: AC
  Administered 2016-01-22: 5 mg via RESPIRATORY_TRACT
  Filled 2016-01-22: qty 6

## 2016-01-22 NOTE — ED Notes (Signed)
Pt in xray

## 2016-01-22 NOTE — ED Notes (Signed)
Patient here with palpitations that started again yesterday. Denies Cp, also complains of headache with same

## 2016-01-22 NOTE — ED Provider Notes (Signed)
CSN: [id number]     Arrival date & time 01/22/16  1545 History   First MD Initiated Contact with Patient 01/22/16 1708     Chief Complaint  Patient presents with  . Palpitations    HPI  Patient presents with concern of ongoing palpitations, without chest pain Patient has had episodes for a long time, has a history of atrial fibrillation. Notably, the patient was here 1 week ago, is scheduled to follow-up with cardiology in 4 days. She notes that since discharge she has been taking all medication as directed, but has persistent palpitations. Symptoms were more pronounced just prior to ED arrival. precipitant, no clear alleviating or exacerbating factors. Patient denies dyspnea, or other focal complaints.   Past Medical History  Diagnosis Date  . Asthma   . Hypertension   . GERD (gastroesophageal reflux disease)   . A-fib ([place])   . Migraine   . Depression   . Acute hypokalemia 11/2015  . Chronic anticoagulation    Past Surgical History  Procedure Laterality Date  . Tubal ligation    . Tonsillectomy     Family History  Problem Relation Age of Onset  . Cancer Mother   . Cancer Father   . Heart attack Mother 63   Social History  Substance Use Topics  . Smoking status: Former Smoker    Types: Cigarettes    Quit date: 10/24/2015  . Smokeless tobacco: Never Used  . Alcohol Use: No   OB History    No data available     Review of Systems  Constitutional:       Per HPI, otherwise negative  HENT:       Per HPI, otherwise negative  Respiratory:       Per HPI, otherwise negative  Cardiovascular:       Per HPI, otherwise negative  Gastrointestinal: Negative for vomiting.  Endocrine:       Negative aside from HPI  Genitourinary:       Neg aside from HPI   Musculoskeletal:       Per HPI, otherwise negative  Skin: Negative.   Neurological: Negative for syncope.      Allergies  Protonix and Celecoxib  Home Medications   Prior to Admission medications     Medication Sig Start Date End Date Taking? Authorizing Provider  albuterol (PROVENTIL HFA;VENTOLIN HFA) 108 (90 Base) MCG/ACT inhaler Inhale 2 puffs into the lungs every 6 (six) hours as needed for wheezing or shortness of breath.    Historical Provider, MD  albuterol (PROVENTIL) (2.5 MG/3ML) 0.083% nebulizer solution Take 3 mLs (2.5 mg total) by nebulization every 6 (six) hours as needed for wheezing or shortness of breath. 07/07/14   Quintella Reichert, MD  benzonatate (TESSALON) 100 MG capsule Take 1 capsule (100 mg total) by mouth every 8 (eight) hours. 01/13/16   Alfonzo Beers, MD  diltiazem (CARDIZEM CD) 240 MG 24 hr capsule Take 1 capsule (240 mg total) by mouth daily. 12/02/15   Varney Biles, MD  hydrochlorothiazide (HYDRODIURIL) 25 MG tablet Take 25 mg by mouth daily. 10/31/15   Historical Provider, MD  loratadine (CLARITIN) 10 MG tablet Take 1 tablet (10 mg total) by mouth daily. Patient taking differently: Take 10 mg by mouth daily as needed for allergies.  11/28/15   Leo Grosser, MD  omeprazole (PRILOSEC) 20 MG capsule Take 1 capsule (20 mg total) by mouth daily. 11/28/15   Leo Grosser, MD  polyethylene glycol powder (MIRALAX) powder Take 255 g by mouth  daily as needed for mild constipation. 11/28/15   Leo Grosser, MD  potassium chloride SA (K-DUR,KLOR-CON) 20 MEQ tablet Take 20 mEq by mouth 2 (two) times daily. 11/12/15   Historical Provider, MD  rivaroxaban (XARELTO) 20 MG TABS tablet Take 1 tablet (20 mg total) by mouth daily with supper. 06/10/15   Nita Sells, MD  Skin Protectants, Misc. (EUCERIN) cream Apply topically as needed for wound care. 01/22/16   Carmin Muskrat, MD  tiotropium (SPIRIVA HANDIHALER) 18 MCG inhalation capsule Place 1 capsule (18 mcg total) into inhaler and inhale daily. 06/22/13   Lacretia Leigh, MD   BP 153/96 mmHg  Pulse 82  Temp(Src) 99.2 F (37.3 C) (Oral)  Resp 18  SpO2 95% Physical Exam  Constitutional: She is oriented to person, place, and time. She  appears well-developed and well-nourished. No distress.  HENT:  Head: Normocephalic and atraumatic.  Eyes: Conjunctivae and EOM are normal.  Cardiovascular: Normal rate and regular rhythm.   Pulmonary/Chest: Effort normal. No stridor. No respiratory distress. She has wheezes in the right upper field, the right middle field, the right lower field and the left upper field.  Abdominal: She exhibits no distension. There is no tenderness.  Musculoskeletal: She exhibits no edema.  Neurological: She is alert and oriented to person, place, and time. No cranial nerve deficit.  Skin: Skin is warm and dry.     Psychiatric: She has a normal mood and affect.  Nursing note and vitals reviewed.   ED Course  Procedures (including critical care time) Labs Review Labs Reviewed  BASIC METABOLIC PANEL - Abnormal; Notable for the following:    Glucose, Bld 100 (*)    All other components within normal limits  CBC - Abnormal; Notable for the following:    RDW 16.0 (*)    All other components within normal limits  TSH  T4, FREE  I-STAT TROPOININ, ED    Imaging Review Dg Chest 2 View  01/22/2016  CLINICAL DATA:  Heart palpitations for over a week. Shortness of breath and chest tightness. Initial encounter. EXAM: CHEST  2 VIEW COMPARISON:  PA and lateral chest 07/01/2015. FINDINGS: The chest is hyperexpanded but the lungs are clear. Heart size is normal. No pneumothorax or pleural effusion. No focal bony abnormality. IMPRESSION: No acute disease. Bilateral pulmonary hyperexpansion suggestive of COPD. Electronically Signed   By: Inge Rise M.D.   On: 01/22/2016 17:16   I have personally reviewed and evaluated these images and lab results as part of my medical decision-making.   EKG Interpretation   Date/Time:  Thursday January 22 2016 16:04:18 EDT Ventricular Rate:  105 PR Interval:  148 QRS Duration: 80 QT Interval:  364 QTC Calculation: 481 R Axis:   73 Text Interpretation:  Atrial  fibrillation and periods of sinus rhythm with  PAC Abnormal ekg Confirmed by Carmin Muskrat  MD ([id number]) on 01/22/2016  5:13:16 PM     6:58 PM Wheezing has resolved.  We Discussed the patient's history, and prior smoking, asthma, no history of emphysema or COPD. Patient will use albuterol every 4 hours for the next 2 days. She'll follow-up with cardiology as scheduled in 4 days.  MDM   Final diagnoses:  Palpitations  Wheezing   Patient with history of atrial fibrillation, currently on Cardizem, Xarelto now presents with ongoing palpitations. Here, the patient is awake and alert, with no ongoing palpitations, though with some EKG evidence for episodic A. fib. No evidence for ongoing ischemia, nor other acute findings. Patient  is found to have bilateral wheezing, likely contributing to her generalized concern. Patient improved initially with albuterol, was discharged with same.   Carmin Muskrat, MD 01/22/16 1901

## 2016-01-22 NOTE — Discharge Instructions (Signed)
As discussed today's evaluation has been reassuring.  Please take all medication as directed, and use the provided albuterol inhaler every 4 hours for the next 2 days.  Return here for concerning changes in your condition.

## 2016-01-27 ENCOUNTER — Ambulatory Visit: Payer: Self-pay | Admitting: Cardiology

## 2016-02-10 ENCOUNTER — Encounter (HOSPITAL_COMMUNITY): Payer: Self-pay | Admitting: Family Medicine

## 2016-02-10 ENCOUNTER — Emergency Department (HOSPITAL_COMMUNITY)
Admission: EM | Admit: 2016-02-10 | Discharge: 2016-02-10 | Disposition: A | Discharge status: Home / Self Care | Payer: Medicare Other | Attending: Emergency Medicine | Admitting: Emergency Medicine

## 2016-02-10 ENCOUNTER — Other Ambulatory Visit: Payer: Self-pay

## 2016-02-10 ENCOUNTER — Emergency Department (HOSPITAL_COMMUNITY): Payer: Medicare Other

## 2016-02-10 DIAGNOSIS — I480 Paroxysmal atrial fibrillation: Secondary | ICD-10-CM

## 2016-02-10 DIAGNOSIS — I10 Essential (primary) hypertension: Secondary | ICD-10-CM | POA: Diagnosis not present

## 2016-02-10 DIAGNOSIS — Z7901 Long term (current) use of anticoagulants: Secondary | ICD-10-CM | POA: Insufficient documentation

## 2016-02-10 DIAGNOSIS — R079 Chest pain, unspecified: Secondary | ICD-10-CM | POA: Diagnosis not present

## 2016-02-10 DIAGNOSIS — Z87891 Personal history of nicotine dependence: Secondary | ICD-10-CM | POA: Insufficient documentation

## 2016-02-10 DIAGNOSIS — F329 Major depressive disorder, single episode, unspecified: Secondary | ICD-10-CM | POA: Insufficient documentation

## 2016-02-10 DIAGNOSIS — R21 Rash and other nonspecific skin eruption: Secondary | ICD-10-CM | POA: Diagnosis not present

## 2016-02-10 DIAGNOSIS — J45909 Unspecified asthma, uncomplicated: Secondary | ICD-10-CM | POA: Insufficient documentation

## 2016-02-10 LAB — BASIC METABOLIC PANEL
ANION GAP: 6 (ref 5–15)
BUN: 9 mg/dL (ref 6–20)
CALCIUM: 9.5 mg/dL (ref 8.9–10.3)
CO2: 22 mmol/L (ref 22–32)
Chloride: 112 mmol/L — ABNORMAL HIGH (ref 101–111)
Creatinine, Ser: 0.66 mg/dL (ref 0.44–1.00)
Glucose, Bld: 91 mg/dL (ref 65–99)
Potassium: 3.2 mmol/L — ABNORMAL LOW (ref 3.5–5.1)
Sodium: 140 mmol/L (ref 135–145)

## 2016-02-10 LAB — CBC
HCT: 37.3 % (ref 36.0–46.0)
HEMOGLOBIN: 12.2 g/dL (ref 12.0–15.0)
MCH: 26.6 pg (ref 26.0–34.0)
MCHC: 32.7 g/dL (ref 30.0–36.0)
MCV: 81.4 fL (ref 78.0–100.0)
Platelets: 194 10*3/uL (ref 150–400)
RBC: 4.58 MIL/uL (ref 3.87–5.11)
RDW: 15.8 % — ABNORMAL HIGH (ref 11.5–15.5)
WBC: 4.3 10*3/uL (ref 4.0–10.5)

## 2016-02-10 LAB — I-STAT TROPONIN, ED: TROPONIN I, POC: 0 ng/mL (ref 0.00–0.08)

## 2016-02-10 MED ORDER — DILTIAZEM HCL ER COATED BEADS 120 MG PO CP24: 120.0000 mg | ORAL_CAPSULE | Freq: Every day | ORAL | Status: DC

## 2016-02-10 MED ORDER — HYDROCORTISONE 1 % EX CREA: TOPICAL_CREAM | CUTANEOUS | Status: DC

## 2016-02-10 MED ORDER — POTASSIUM CHLORIDE CRYS ER 20 MEQ PO TBCR
40.0000 meq | EXTENDED_RELEASE_TABLET | Freq: Once | ORAL | Status: AC
  Administered 2016-02-10: 40 meq via ORAL
  Filled 2016-02-10: qty 2

## 2016-02-10 NOTE — ED Notes (Signed)
Pt here for tachycardia, chest pain that started at 12. sts some SOB.

## 2016-02-10 NOTE — ED Provider Notes (Signed)
CSN: [id number]     Arrival date & time 02/10/16  1415 History   First MD Initiated Contact with Patient 02/10/16 1715     Chief Complaint  Patient presents with  . Chest Pain   Patient is a 65 y.o. female presenting with chest pain.  Chest Pain Associated symptoms: no fever    Pt states she has pAF and felt that she was having palpitations, felt cp around 12 but no sob. She no fevers, cough, congestion. No leg pain, no leg swelling, no hemoptysis; pt on xeralto - hasnt missed any doses. While waiting in waiting room, heart slowed down and now she is asymptomatic. Did state she was urinating more frequencly but no pain or abdominal pain. Sx now resolved. She did not take anything for them. She takes 120 mg ER diltiazem for afib. Cp not worse with exertion. Pt states cardizem making her nauseated at times, headaches.   Past Medical History  Diagnosis Date  . Asthma   . Hypertension   . GERD (gastroesophageal reflux disease)   . A-fib ([place])   . Migraine   . Depression   . Acute hypokalemia 11/2015  . Chronic anticoagulation    Past Surgical History  Procedure Laterality Date  . Tubal ligation    . Tonsillectomy     Family History  Problem Relation Age of Onset  . Cancer Mother   . Cancer Father   . Heart attack Mother 47   Social History  Substance Use Topics  . Smoking status: Former Smoker    Types: Cigarettes    Quit date: 10/24/2015  . Smokeless tobacco: Never Used  . Alcohol Use: No   OB History    No data available     Review of Systems  Constitutional: Negative for fever.  Cardiovascular: Positive for chest pain.  Allergic/Immunologic: Negative for immunocompromised state.  All other systems reviewed and are negative.     Allergies  Protonix and Celecoxib  Home Medications   Prior to Admission medications   Medication Sig Start Date End Date Taking? Authorizing Provider  albuterol (PROVENTIL HFA;VENTOLIN HFA) 108 (90 Base) MCG/ACT inhaler Inhale 2  puffs into the lungs every 6 (six) hours as needed for wheezing or shortness of breath.    Historical Provider, MD  albuterol (PROVENTIL) (2.5 MG/3ML) 0.083% nebulizer solution Take 3 mLs (2.5 mg total) by nebulization every 6 (six) hours as needed for wheezing or shortness of breath. 07/07/14   Quintella Reichert, MD  benzonatate (TESSALON) 100 MG capsule Take 1 capsule (100 mg total) by mouth every 8 (eight) hours. 01/13/16   Alfonzo Beers, MD  diltiazem (CARDIZEM CD) 120 MG 24 hr capsule Take 1 capsule (120 mg total) by mouth daily. 02/10/16   Karma Greaser, MD  hydrochlorothiazide (HYDRODIURIL) 25 MG tablet Take 25 mg by mouth daily. 10/31/15   Historical Provider, MD  hydrocortisone cream 1 % Apply to affected area 2 times daily 02/10/16   Karma Greaser, MD  loratadine (CLARITIN) 10 MG tablet Take 1 tablet (10 mg total) by mouth daily. Patient taking differently: Take 10 mg by mouth daily as needed for allergies.  11/28/15   Leo Grosser, MD  omeprazole (PRILOSEC) 20 MG capsule Take 1 capsule (20 mg total) by mouth daily. 11/28/15   Leo Grosser, MD  polyethylene glycol powder [facility]) powder Take 255 g by mouth daily as needed for mild constipation. 11/28/15   Leo Grosser, MD  potassium chloride SA (K-DUR,KLOR-CON) 20 MEQ tablet Take  20 mEq by mouth 2 (two) times daily. 11/12/15   Historical Provider, MD  rivaroxaban (XARELTO) 20 MG TABS tablet Take 1 tablet (20 mg total) by mouth daily with supper. 06/10/15   Nita Sells, MD  Skin Protectants, Misc. (EUCERIN) cream Apply topically as needed for wound care. 01/22/16   Carmin Muskrat, MD  tiotropium (SPIRIVA HANDIHALER) 18 MCG inhalation capsule Place 1 capsule (18 mcg total) into inhaler and inhale daily. 06/22/13   Lacretia Leigh, MD   BP 151/98 mmHg  Pulse 59  Temp(Src) 97.8 F (36.6 C) (Oral)  Resp 21  SpO2 100% Physical Exam  Constitutional: She appears well-developed and well-nourished. No distress.  HENT:  Head: Normocephalic  and atraumatic.  Eyes: Conjunctivae are normal. Right eye exhibits no discharge. Left eye exhibits no discharge.  Neck: Normal range of motion. Neck supple. No JVD present.  Cardiovascular: Normal rate, regular rhythm, normal heart sounds and intact distal pulses.   No murmur heard. Pulmonary/Chest: Effort normal and breath sounds normal. No respiratory distress.  Abdominal: Soft. Bowel sounds are normal. She exhibits no distension and no mass. There is no tenderness. There is no rebound and no guarding.  Musculoskeletal: She exhibits no edema (no leg swelling or tenderness).  Neurological: She is alert.  Skin: Skin is warm. Rash (dark skin in right lower extremity, 10cm diameter) noted.  Psychiatric: She has a normal mood and affect.  Nursing note and vitals reviewed.   ED Course  Procedures (including critical care time) Labs Review Labs Reviewed  BASIC METABOLIC PANEL - Abnormal; Notable for the following:    Potassium 3.2 (*)    Chloride 112 (*)    All other components within normal limits  CBC - Abnormal; Notable for the following:    RDW 15.8 (*)    All other components within normal limits  I-STAT TROPOININ, ED    Imaging Review Dg Chest 2 View  02/10/2016  CLINICAL DATA:  65 year old female with chest pain and tachycardia since noon today. Initial encounter. EXAM: CHEST  2 VIEW COMPARISON:  01/22/2016 and earlier. FINDINGS: Stable mild cardiomegaly. Other mediastinal contours are within normal limits. Visualized tracheal air column is within normal limits. No pneumothorax, pulmonary edema, pleural effusion or confluent pulmonary opacity. Chronic hyperinflation appears stable since 2015. No acute osseous abnormality identified. IMPRESSION: Chronic hyperinflation and mild cardiomegaly. No acute cardiopulmonary abnormality. Electronically Signed   By: Genevie Ann M.D.   On: 02/10/2016 15:00   I have personally reviewed and evaluated these images and lab results as part of my medical  decision-making.   EKG Interpretation   Date/Time:  Tuesday February 10 2016 14:23:00 EDT Ventricular Rate:  115 PR Interval:  184 QRS Duration: 84 QT Interval:  332 QTC Calculation: 459 R Axis:   76 Text Interpretation:  Atrial flutter with variable A-V block Non-specific  ST-t changes Confirmed by Ashok Cordia  MD, Lennette Bihari ([id number]) on 02/10/2016 4:45:44  PM      MDM   Final diagnoses:  PAF (paroxysmal atrial fibrillation) ([place]    Patient has paroxysmal A. fib and presents today with symptoms of such, now in sinus rhythm, completely asymptomatic with reassuring exam. Doubt pulmonary embolism as patient on Xarelto, doubt ACS is initial troponin is negative and EKG not significant with acute ischemia. Doubt pneumonia and chest x-ray is negative and patient does not endorse any infectious symptoms. Patient had a follow-up with cardiology in one week and advised to follow up and discuss whether she requires any when necessary  short-acting medications that she has multiple visits to the ER for same. Patient also complaining of a dark rash to her right lower extremity that's been present for over a month but does not appear consistent with cellulitis or infection. Hydrocortisone prescribed. Patient discharged in good conditions with strict return precautions.    Karma Greaser, MD 02/10/16 [id number]  Lajean Saver, MD 02/10/16 [id number]

## 2016-02-10 NOTE — Discharge Instructions (Signed)
Please discuss with your cardiologist at your follow up appt whether they would consider changing your home diltiazem regimen or add a short-acting "as needed" medication for when you feel your heart going fast, especially because you are having multiple episodes like this that are bringing you to the ER.   Atrial Fibrillation Atrial fibrillation is a type of irregular or rapid heartbeat (arrhythmia). In atrial fibrillation, the heart quivers continuously in a chaotic pattern. This occurs when parts of the heart receive disorganized signals that make the heart unable to pump blood normally. This can increase the risk for stroke, heart failure, and other heart-related conditions. There are different types of atrial fibrillation, including:  Paroxysmal atrial fibrillation. This type starts suddenly, and it usually stops on its own shortly after it starts.  Persistent atrial fibrillation. This type often lasts longer than a week. It may stop on its own or with treatment.  Long-lasting persistent atrial fibrillation. This type lasts longer than 12 months.  Permanent atrial fibrillation. This type does not go away. Talk with your health care provider to learn about the type of atrial fibrillation that you have. CAUSES This condition is caused by some heart-related conditions or procedures, including:  A heart attack.  Coronary artery disease.  Heart failure.  Heart valve conditions.  High blood pressure.  Inflammation of the sac that surrounds the heart (pericarditis).  Heart surgery.  Certain heart rhythm disorders, such as Wolf-Parkinson-White syndrome. Other causes include:  Pneumonia.  Obstructive sleep apnea.  Blockage of an artery in the lungs (pulmonary embolism, or PE).  Lung cancer.  Chronic lung disease.  Thyroid problems, especially if the thyroid is overactive (hyperthyroidism).  Caffeine.  Excessive alcohol use or illegal drug use.  Use of some medicines,  including certain decongestants and diet pills. Sometimes, the cause cannot be found. RISK FACTORS This condition is more likely to develop in:  People who are older in age.  People who smoke.  People who have diabetes mellitus.  People who are overweight (obese).  Athletes who exercise vigorously. SYMPTOMS Symptoms of this condition include:  A feeling that your heart is beating rapidly or irregularly.  A feeling of discomfort or pain in your chest.  Shortness of breath.  Sudden light-headedness or weakness.  Getting tired easily during exercise. In some cases, there are no symptoms. DIAGNOSIS Your health care provider may be able to detect atrial fibrillation when taking your pulse. If detected, this condition may be diagnosed with:  An electrocardiogram (ECG).  A Holter monitor test that records your heartbeat patterns over a 24-hour period.  Transthoracic echocardiogram (TTE) to evaluate how blood flows through your heart.  Transesophageal echocardiogram (TEE) to view more detailed images of your heart.  A stress test.  Imaging tests, such as a CT scan or chest X-ray.  Blood tests. TREATMENT The main goals of treatment are to prevent blood clots from forming and to keep your heart beating at a normal rate and rhythm. The type of treatment that you receive depends on many factors, such as your underlying medical conditions and how you feel when you are experiencing atrial fibrillation. This condition may be treated with:  Medicine to slow down the heart rate, bring the heart's rhythm back to normal, or prevent clots from forming.  Electrical cardioversion. This is a procedure that resets your heart's rhythm by delivering a controlled, low-energy shock to the heart through your skin.  Different types of ablation, such as catheter ablation, catheter ablation with pacemaker,  or surgical ablation. These procedures destroy the heart tissues that send abnormal signals.  When the pacemaker is used, it is placed under your skin to help your heart beat in a regular rhythm. HOME CARE INSTRUCTIONS  Take over-the counter and prescription medicines only as told by your health care provider.  If your health care provider prescribed a blood-thinning medicine (anticoagulant), take it exactly as told. Taking too much blood-thinning medicine can cause bleeding. If you do not take enough blood-thinning medicine, you will not have the protection that you need against stroke and other problems.  Do not use tobacco products, including cigarettes, chewing tobacco, and e-cigarettes. If you need help quitting, ask your health care provider.  If you have obstructive sleep apnea, manage your condition as told by your health care provider.  Do not drink alcohol.  Do not drink beverages that contain caffeine, such as coffee, soda, and tea.  Maintain a healthy weight. Do not use diet pills unless your health care provider approves. Diet pills may make heart problems worse.  Follow diet instructions as told by your health care provider.  Exercise regularly as told by your health care provider.  Keep all follow-up visits as told by your health care provider. This is important. PREVENTION  Avoid drinking beverages that contain caffeine or alcohol.  Avoid certain medicines, especially medicines that are used for breathing problems.  Avoid certain herbs and herbal medicines, such as those that contain ephedra or ginseng.  Do not use illegal drugs, such as cocaine and amphetamines.  Do not smoke.  Manage your high blood pressure. SEEK MEDICAL CARE IF:  You notice a change in the rate, rhythm, or strength of your heartbeat.  You are taking an anticoagulant and you notice increased bruising.  You tire more easily when you exercise or exert yourself. SEEK IMMEDIATE MEDICAL CARE IF:  You have chest pain, abdominal pain, sweating, or weakness.  You feel nauseous.  You  notice blood in your vomit, bowel movement, or urine.  You have shortness of breath.  You suddenly have swollen feet and ankles.  You feel dizzy.  You have sudden weakness or numbness of the face, arm, or leg, especially on one side of the body.  You have trouble speaking, trouble understanding, or both (aphasia).  Your face or your eyelid droops on one side. These symptoms may represent a serious problem that is an emergency. Do not wait to see if the symptoms will go away. Get medical help right away. Call your local emergency services (911 in the [place] Do not drive yourself to the hospital.   This information is not intended to replace advice given to you by your health care provider. Make sure you discuss any questions you have with your health care provider.   Document Released: 08/09/2005 Document Revised: 04/30/2015 Document Reviewed: 12/04/2014 Elsevier Interactive Patient Education [employer].

## 2016-02-12 ENCOUNTER — Other Ambulatory Visit: Payer: Self-pay

## 2016-02-12 ENCOUNTER — Encounter (HOSPITAL_COMMUNITY): Payer: Self-pay | Admitting: Nurse Practitioner

## 2016-02-12 ENCOUNTER — Ambulatory Visit: Payer: Self-pay | Admitting: Physician Assistant

## 2016-02-12 ENCOUNTER — Emergency Department (HOSPITAL_COMMUNITY)
Admission: EM | Admit: 2016-02-12 | Discharge: 2016-02-12 | Disposition: A | Discharge status: Home / Self Care | Payer: Medicare Other | Attending: Emergency Medicine | Admitting: Emergency Medicine

## 2016-02-12 DIAGNOSIS — I4891 Unspecified atrial fibrillation: Secondary | ICD-10-CM | POA: Diagnosis not present

## 2016-02-12 DIAGNOSIS — J45909 Unspecified asthma, uncomplicated: Secondary | ICD-10-CM | POA: Diagnosis not present

## 2016-02-12 DIAGNOSIS — Z87891 Personal history of nicotine dependence: Secondary | ICD-10-CM | POA: Insufficient documentation

## 2016-02-12 DIAGNOSIS — Z7901 Long term (current) use of anticoagulants: Secondary | ICD-10-CM | POA: Insufficient documentation

## 2016-02-12 DIAGNOSIS — I10 Essential (primary) hypertension: Secondary | ICD-10-CM | POA: Diagnosis not present

## 2016-02-12 DIAGNOSIS — Z79899 Other long term (current) drug therapy: Secondary | ICD-10-CM | POA: Insufficient documentation

## 2016-02-12 DIAGNOSIS — I480 Paroxysmal atrial fibrillation: Secondary | ICD-10-CM | POA: Diagnosis not present

## 2016-02-12 DIAGNOSIS — R000 Tachycardia, unspecified: Secondary | ICD-10-CM | POA: Diagnosis present

## 2016-02-12 LAB — CBC
HEMATOCRIT: 41.2 % (ref 36.0–46.0)
HEMOGLOBIN: 13.5 g/dL (ref 12.0–15.0)
MCH: 26.2 pg (ref 26.0–34.0)
MCHC: 32.8 g/dL (ref 30.0–36.0)
MCV: 79.8 fL (ref 78.0–100.0)
Platelets: 215 10*3/uL (ref 150–400)
RBC: 5.16 MIL/uL — AB (ref 3.87–5.11)
RDW: 15.8 % — AB (ref 11.5–15.5)
WBC: 5.2 10*3/uL (ref 4.0–10.5)

## 2016-02-12 LAB — BASIC METABOLIC PANEL
ANION GAP: 10 (ref 5–15)
BUN: 15 mg/dL (ref 6–20)
CHLORIDE: 106 mmol/L (ref 101–111)
CO2: 22 mmol/L (ref 22–32)
Calcium: 10.3 mg/dL (ref 8.9–10.3)
Creatinine, Ser: 0.86 mg/dL (ref 0.44–1.00)
Glucose, Bld: 96 mg/dL (ref 65–99)
POTASSIUM: 4.6 mmol/L (ref 3.5–5.1)
SODIUM: 138 mmol/L (ref 135–145)

## 2016-02-12 LAB — I-STAT TROPONIN, ED: Troponin i, poc: 0 ng/mL (ref 0.00–0.08)

## 2016-02-12 MED ORDER — FLECAINIDE ACETATE 50 MG PO TABS: 50.0000 mg | ORAL_TABLET | Freq: Two times a day (BID) | ORAL | Status: DC

## 2016-02-12 MED ORDER — DILTIAZEM LOAD VIA INFUSION
15.0000 mg | Freq: Once | INTRAVENOUS | Status: AC
  Administered 2016-02-12: 15 mg via INTRAVENOUS
  Filled 2016-02-12: qty 15

## 2016-02-12 MED ORDER — DILTIAZEM HCL 60 MG PO TABS
ORAL_TABLET | ORAL | Status: DC
Start: 2016-02-12 — End: 2016-04-01

## 2016-02-12 MED ORDER — DILTIAZEM HCL 100 MG IV SOLR
INTRAVENOUS | Status: AC
  Filled 2016-02-12: qty 100

## 2016-02-12 MED ORDER — SODIUM CHLORIDE 0.9 % IV BOLUS (SEPSIS)
500.0000 mL | Freq: Once | INTRAVENOUS | Status: AC
  Administered 2016-02-12: 500 mL via INTRAVENOUS

## 2016-02-12 MED ORDER — FLECAINIDE ACETATE 50 MG PO TABS
50.0000 mg | ORAL_TABLET | Freq: Once | ORAL | Status: AC
  Administered 2016-02-12: 50 mg via ORAL
  Filled 2016-02-12: qty 1

## 2016-02-12 MED ORDER — DILTIAZEM HCL 100 MG IV SOLR
5.0000 mg/h | INTRAVENOUS | Status: DC
  Administered 2016-02-12: 5 mg/h via INTRAVENOUS

## 2016-02-12 MED ORDER — ADENOSINE 6 MG/2ML IV SOLN
INTRAVENOUS | Status: AC
  Filled 2016-02-12: qty 2

## 2016-02-12 NOTE — ED Notes (Signed)
Pt remains in AFIB, HR 81

## 2016-02-12 NOTE — Consult Note (Addendum)
Cardiology Consult    Patient ID: [name] MRN: [id number], DOB/AGE: May 09, 1951   Admit date: 02/12/2016   Primary Physician: Shelby Blocker, MD Primary Cardiologist: Dr. Ellyn Bartlett Requesting Provider: Dr. Jeneen Bartlett  Patient Profile    65 yo female with PMH of asthma/HTN/GERD/A-Fib/marijuana use and depression who presented to the [facility] ED with reports of palpitations and rapid heart beat.  Past Medical History    Past Medical History  Diagnosis Date  . Asthma   . Hypertension   . GERD (gastroesophageal reflux disease)   . A-fib ([place])   . Migraine   . Depression   . Acute hypokalemia 11/2015  . Chronic anticoagulation     Past Surgical History  Procedure Laterality Date  . Tubal ligation    . Tonsillectomy       Allergies  Allergies  Allergen Reactions  . Protonix [Pantoprazole Sodium]     Heart races  . Celecoxib Rash    History of Present Illness    [name] is a 65 yo female patient of Dr. Ellyn Bartlett with PMH asthma/HTN/GERD/A-Fib/ marijuana use and depression Who was last seen in the ED on 02/10/2016 demonstrating PAF complaining of palpitations and chest pain. While in the waiting room her heart rate slowed and converted to normal sinus rhythm and her symptoms resolved. She was discharged home with plans to follow-up with cardiology (Dr. Ellyn Bartlett) within the week. She denied missing any doses of her Xarelto at that time. In further review of the chart she was here for the same symptoms in 11/27/2015, at that time she easily converted on IV diltiazem, and underwent Myoview which was negative for ischemia. During that admission it was stated if she continued to have more frequent A. fib should consider antiarrhythmic drug therapy in the future. She was continued on by mouth Cardizem and low-dose metoprolol was added. Shortly after she returned back to the ER and reported dizziness and lightheadedness since starting the metoprolol. Subsequently the metoprolol was stopped  at that time.  Patient reports this morning she was awakened with rapid heartbeat, but no chest pain shortness of breath or lightheadedness. She also complained of a headache. States she knows whenever her heart is out of rhythm because she can feel palpitations in her chest. Proceeded to come back to the ER for further evaluation. She denies having had missed any of her medication doses at home including her Xarelto.  In the ED she was noted to be in A. fib RVR and started on IV diltiazem with a bolus. Her rate has significantly slowed and she appears to be having intermittent episodes of sinus rhythm in between atrial flutter. Her labs showed potassium of 4.6, creatinine of 0.86 negative point-of-care troponin hemoglobin 13.5. TSH was checked back at the beginning of this month and noted to be normal. Chest x-ray as of 2 days ago was negative for any acute process or edema. She is currently maintained on 5 mg of IV diltiazem, with her rate reduced into the 60s and 70s.  Cardiology has been called in relation to patient's history of A. fib and multiple recurrent ER visits.  Home Medications    Prior to Admission medications   Medication Sig Start Date End Date Taking? Authorizing Provider  albuterol (PROVENTIL HFA;VENTOLIN HFA) 108 (90 Base) MCG/ACT inhaler Inhale 2 puffs into the lungs every 6 (six) hours as needed for wheezing or shortness of breath.    Historical Provider, MD  albuterol (PROVENTIL) (2.5 MG/3ML) 0.083% nebulizer  solution Take 3 mLs (2.5 mg total) by nebulization every 6 (six) hours as needed for wheezing or shortness of breath. 07/07/14   Shelby Reichert, MD  benzonatate (TESSALON) 100 MG capsule Take 1 capsule (100 mg total) by mouth every 8 (eight) hours. 01/13/16   Shelby Beers, MD  diltiazem (CARDIZEM CD) 120 MG 24 hr capsule Take 1 capsule (120 mg total) by mouth daily. 02/10/16   Shelby Greaser, MD  hydrochlorothiazide (HYDRODIURIL) 25 MG tablet Take 25 mg by mouth daily.  10/31/15   Historical Provider, MD  hydrocortisone cream 1 % Apply to affected area 2 times daily 02/10/16   Shelby Greaser, MD  loratadine (CLARITIN) 10 MG tablet Take 1 tablet (10 mg total) by mouth daily. Patient taking differently: Take 10 mg by mouth daily as needed for allergies.  11/28/15   Shelby Grosser, MD  omeprazole (PRILOSEC) 20 MG capsule Take 1 capsule (20 mg total) by mouth daily. 11/28/15   Shelby Grosser, MD  polyethylene glycol powder [facility]) powder Take 255 g by mouth daily as needed for mild constipation. 11/28/15   Shelby Grosser, MD  potassium chloride SA (K-DUR,KLOR-CON) 20 MEQ tablet Take 20 mEq by mouth 2 (two) times daily. 11/12/15   Historical Provider, MD  rivaroxaban (XARELTO) 20 MG TABS tablet Take 1 tablet (20 mg total) by mouth daily with supper. 06/10/15   Shelby Sells, MD  Skin Protectants, Misc. (EUCERIN) cream Apply topically as needed for wound care. 01/22/16   Shelby Muskrat, MD  tiotropium (SPIRIVA HANDIHALER) 18 MCG inhalation capsule Place 1 capsule (18 mcg total) into inhaler and inhale daily. 06/22/13   Shelby Leigh, MD    Family History    Family History  Problem Relation Age of Onset  . Cancer Mother   . Cancer Father   . Heart attack Mother 42    Social History    Social History   Social History  . Marital Status: Single    Spouse Name: N/A  . Number of Children: N/A  . Years of Education: N/A   Occupational History  . Not on file.   Social History Main Topics  . Smoking status: Former Smoker    Types: Cigarettes    Quit date: 10/24/2015  . Smokeless tobacco: Never Used  . Alcohol Use: No  . Drug Use: Yes    Special: Marijuana     Comment: Smoked marijuana in the past. Smoked crack cocaine in the past. Quit 22 years ago.  Marland Kitchen Sexual Activity: Not on file   Other Topics Concern  . Not on file   Social History Narrative   Lives in [place], [place] with daughter.      Review of Systems    General:  No chills, fever, night  sweats or weight changes.  Cardiovascular:  See HPI Dermatological: No rash, lesions/masses Respiratory: No cough, dyspnea Urologic: No hematuria, dysuria Abdominal:   No nausea, vomiting, diarrhea, bright red blood per rectum, melena, or hematemesis Neurologic:  No visual changes, wkns, changes in mental status. All other systems reviewed and are otherwise negative except as noted above.  Physical Exam    Blood pressure 91/63, pulse 82, temperature 97.8 F (36.6 C), temperature source Oral, resp. rate 20, SpO2 96 %.  General: Pleasant older female, NAD Psych: Normal affect. Neuro: Alert and oriented X 3. Moves all extremities spontaneously. HEENT: Normal  Neck: Supple without bruits or JVD. Lungs:  Resp regular and unlabored, CTA. Heart: Irregularly Irregular no s3, s4, or murmurs.  Abdomen: Soft, non-tender, non-distended, BS + x 4.  Extremities: No clubbing, cyanosis or edema. DP/PT/Radials 2+ and equal bilaterally.  Labs    Troponin [facility] Test)  Recent Labs  02/12/16 1142  TROPIPOC 0.00   No results for input(s): CKTOTAL, CKMB, TROPONINI in the last 72 hours. Lab Results  Component Value Date   WBC 5.2 02/12/2016   HGB 13.5 02/12/2016   HCT 41.2 02/12/2016   MCV 79.8 02/12/2016   PLT 215 02/12/2016    Recent Labs Lab 02/12/16 1100  NA 138  K 4.6  CL 106  CO2 22  BUN 15  CREATININE 0.86  CALCIUM 10.3  GLUCOSE 96   Lab Results  Component Value Date   CHOL * 03/24/2010    226        ATP III CLASSIFICATION:  <200     mg/dL   Desirable  200-239  mg/dL   Borderline High  >=240    mg/dL   High          HDL 72 03/24/2010   LDLCALC * 03/24/2010    143        Total Cholesterol/HDL:CHD Risk Coronary Heart Disease Risk Table                     Men   Women  1/2 Average Risk   3.4   3.3  Average Risk       5.0   4.4  2 X Average Risk   9.6   7.1  3 X Average Risk  23.4   11.0        Use the calculated Patient Ratio above and the CHD Risk  Table to determine the patient's CHD Risk.        ATP III CLASSIFICATION (LDL):  <100     mg/dL   Optimal  100-129  mg/dL   Near or Above                    Optimal  130-159  mg/dL   Borderline  160-189  mg/dL   High  >190     mg/dL   Very High   TRIG 57 03/24/2010   Lab Results  Component Value Date   DDIMER 0.95* 04/25/2012     Radiology Studies    Dg Chest 2 View  02/10/2016  CLINICAL DATA:  65 year old female with chest pain and tachycardia since noon today. Initial encounter. EXAM: CHEST  2 VIEW COMPARISON:  01/22/2016 and earlier. FINDINGS: Stable mild cardiomegaly. Other mediastinal contours are within normal limits. Visualized tracheal air column is within normal limits. No pneumothorax, pulmonary edema, pleural effusion or confluent pulmonary opacity. Chronic hyperinflation appears stable since 2015. No acute osseous abnormality identified. IMPRESSION: Chronic hyperinflation and mild cardiomegaly. No acute cardiopulmonary abnormality. Electronically Signed   By: Genevie Ann M.D.   On: 02/10/2016 15:00   ECG & Cardiac Imaging    EKG: Sinus rhythm with PVCs. Review of telemetry shows intermittent episodes of sinus rhythm in between atrial fib/A flutter.   ECHO: 06/11/2015  Study Conclusions  - Left ventricle: The cavity size was normal. Systolic function was  normal. The estimated ejection fraction was in the range of 60%  to 65%. Wall motion was normal; there were no regional wall  motion abnormalities. Doppler parameters are consistent with  abnormal left ventricular relaxation (grade 1 diastolic  dysfunction). - Tricuspid valve: There was mild-moderate regurgitation directed  centrally.  Assessment & Plan  Mrs. Faulk is a 65 yo female patient of Dr. Ellyn Bartlett with PMH asthma/HTN/GERD/A-Fib/ marijuana use and depression Who was last seen in the ED on 02/10/2016 demonstrating PAF complaining of palpitations and chest pain. While in the waiting room her heart rate  slowed and converted to normal sinus rhythm and her symptoms resolved. She was discharged home with plans to follow-up with cardiology (Dr. Ellyn Bartlett) within the week. She denied missing any doses of her Xarelto at that time. In further review of the chart she was here for the same symptoms in 11/27/2015, at that time she easily converted on IV diltiazem, and underwent Myoview which was negative for ischemia. During that admission it was stated if she continued to have more frequent A. fib should consider antiarrhythmic drug therapy in the future. She was continued on by mouth Cardizem and low-dose metoprolol was added. Shortly after she returned back to the ER and reported dizziness and lightheadedness since starting the metoprolol. Subsequently the metoprolol was stopped at that time.   Presented back to the ED this morning with reports of Palpitations. In the ED she was noted to be in A. fib RVR and started on IV diltiazem with a bolus. Her rate has significantly slowed and she appears to be having intermittent episodes of sinus rhythm in between atrial flutter. Her labs showed potassium of 4.6, creatinine of 0.86 negative point-of-care troponin hemoglobin 13.5.   1. A-Fib/A-Flutter:  She was started on IV diltiazem with a bolus which ultimately decreased her rate down into the 60s and 70s. She is currently on 5 mg IV, but remains in A. fib/a flutter with intermittent episodes of sinus rhythm. Her home medications include Xarelto which she states she's not missed any doses. During previous admission she was started on metoprolol but was unable to tolerate given her reports of dizziness and lightheadedness. There is also mention of potentially placing patient on flecainide but has not tried this option yet.  --At this time I would keep her on current 120mg  PO dose of diltiazem, and start on Flecainide 50mg  PO BID --Continue Xarelto --Also add 60mg  short acting Diltiazem PRN for her to have on hand when she  develops palpitations to help with rate control.  --I have called an made appt for her to be followed in the a-fib clinic next week on Tuesday for further management  Signed, Reino Bellis, NP-C Pager [phone] 02/12/2016, 2:23 PM  Personally seen and examined. Agree with above.  65 year old with paroxysmal atrial fibrillation, normal ejection fraction and low risk stress test with no history of coronary artery disease. Thin. She converted to normal sinus rhythm on IV diltiazem. CHADS-VASC - 2  - Continue diltiazem CD 120 once a day  - Start flecainide 50 mg twice a day  - Give diltiazem short acting 60 mg by by mouth every 6 hours when necessary, heart racing.  - We will set up for atrial fibrillation clinic next week.  - If she tolerates flecainide well, set up exercise treadmill test in the future.  Heart: Regular rate and rhythm, no significant murmurs Lungs: Clear" bilaterally She is alert and oriented 3, thin female.  Okay with discharge from the emergency room.  Candee Furbish, MD

## 2016-02-12 NOTE — ED Notes (Signed)
Dr. Jeneen Rinks made aware rate 82, remains in AFIB. Will hold titration rate at 5 mg/hr. BP 96/74, giving IV fluid.

## 2016-02-12 NOTE — ED Notes (Signed)
Pt c/o tachycardia, dizziness and weakness since she woke this morning. Symptoms have been persistent since onset. She denies pain. She is alert and breathing easily

## 2016-02-12 NOTE — Discharge Instructions (Signed)
Keep your appointment A. fib clinic on June 27 at 1:30 PM.   Atrial Fibrillation Atrial fibrillation is a type of heartbeat that is irregular or fast (rapid). If you have this condition, your heart keeps quivering in a weird (chaotic) way. This condition can make it so your heart cannot pump blood normally. Having this condition gives a person more risk for stroke, heart failure, and other heart problems. There are different types of atrial fibrillation. Talk with your doctor to learn about the type that you have. HOME CARE  Take over-the-counter and prescription medicines only as told by your doctor.  If your doctor prescribed a blood-thinning medicine, take it exactly as told. Taking too much of it can cause bleeding. If you do not take enough of it, you will not have the protection that you need against stroke and other problems.  Do not use any tobacco products. These include cigarettes, chewing tobacco, and e-cigarettes. If you need help quitting, ask your doctor.  If you have apnea (obstructive sleep apnea), manage it as told by your doctor.  Do not drink alcohol.  Do not drink beverages that have caffeine. These include coffee, soda, and tea.  Maintain a healthy weight. Do not use diet pills unless your doctor says they are safe for you. Diet pills may make heart problems worse.  Follow diet instructions as told by your doctor.  Exercise regularly as told by your doctor.  Keep all follow-up visits as told by your doctor. This is important. GET HELP IF:  You notice a change in the speed, rhythm, or strength of your heartbeat.  You are taking a blood-thinning medicine and you notice more bruising.  You get tired more easily when you move or exercise. GET HELP RIGHT AWAY IF:  You have pain in your chest or your belly (abdomen).  You have sweating or weakness.  You feel sick to your stomach (nauseous).  You notice blood in your throw up (vomit), poop (stool), or pee  (urine).  You are short of breath.  You suddenly have swollen feet and ankles.  You feel dizzy.  Your suddenly get weak or numb in your face, arms, or legs, especially if it happens on one side of your body.  You have trouble talking, trouble understanding, or both.  Your face or your eyelid droops on one side. These symptoms may be an emergency. Do not wait to see if the symptoms will go away. Get medical help right away. Call your local emergency services (911 in the [place] Do not drive yourself to the hospital.   This information is not intended to replace advice given to you by your health care provider. Make sure you discuss any questions you have with your health care provider.   Document Released: 05/18/2008 Document Revised: 04/30/2015 Document Reviewed: 12/04/2014 Elsevier Interactive Patient Education [employer].

## 2016-02-12 NOTE — ED Provider Notes (Signed)
CSN: [id number]     Arrival date & time 02/12/16  1110 History   First MD Initiated Contact with Patient 02/12/16 1119     Chief Complaint  Patient presents with  . Tachycardia     HPI  She presents for evaluation of palpitations. Reports history of atrial fibrillation. Awakened this morning with a feeling of rapid heartbeat.  Diagnosis paroxysmal A. fib in 11/16.  Has had ER visits here as follows:  4/3; A. fib, converted.  4/5: A. fib, admitted on Cardizem drip, discharged on beta blocker, plus Cardizem  4/11: Symptomatic with palpitations and lightheadedness. Converted from A. fib. A blocker   discontinued due to dizziness and discharged  5/23:  Atrial fibrillation  6/1:  A. fib, converted.  6/21:  A. fib, converted with Cardizem drip, discharged.  Awakened this morning with a rapid heartbeat. Does not have any pain. No soreness of breath or lightheadedness. Takes L Quist. Takes Cardizem. States she is compliant. No history of hyperthyroidism. Does not take over-the-counter cough or cold medicine or stimulants. Uses L break B are all occasionally. Not excessively recently.  Past Medical History  Diagnosis Date  . Asthma   . Hypertension   . GERD (gastroesophageal reflux disease)   . A-fib ([place])   . Migraine   . Depression   . Acute hypokalemia 11/2015  . Chronic anticoagulation    Past Surgical History  Procedure Laterality Date  . Tubal ligation    . Tonsillectomy     Family History  Problem Relation Age of Onset  . Cancer Mother   . Cancer Father   . Heart attack Mother 45   Social History  Substance Use Topics  . Smoking status: Former Smoker    Types: Cigarettes    Quit date: 10/24/2015  . Smokeless tobacco: Never Used  . Alcohol Use: No   OB History    No data available     Review of Systems  Constitutional: Negative for fever, chills, diaphoresis, appetite change and fatigue.  HENT: Negative for mouth sores, sore throat and trouble swallowing.    Eyes: Negative for visual disturbance.  Respiratory: Negative for cough, chest tightness, shortness of breath and wheezing.   Cardiovascular: Positive for palpitations. Negative for chest pain.  Gastrointestinal: Negative for nausea, vomiting, abdominal pain, diarrhea and abdominal distention.  Endocrine: Negative for polydipsia, polyphagia and polyuria.  Genitourinary: Negative for dysuria, frequency and hematuria.  Musculoskeletal: Negative for gait problem.  Skin: Negative for color change, pallor and rash.  Neurological: Negative for dizziness, syncope, light-headedness and headaches.  Hematological: Does not bruise/bleed easily.  Psychiatric/Behavioral: Negative for behavioral problems and confusion.      Allergies  Protonix and Celecoxib  Home Medications   Prior to Admission medications   Medication Sig Start Date End Date Taking? Authorizing Provider  albuterol (PROVENTIL HFA;VENTOLIN HFA) 108 (90 Base) MCG/ACT inhaler Inhale 2 puffs into the lungs every 6 (six) hours as needed for wheezing or shortness of breath.    Historical Provider, MD  albuterol (PROVENTIL) (2.5 MG/3ML) 0.083% nebulizer solution Take 3 mLs (2.5 mg total) by nebulization every 6 (six) hours as needed for wheezing or shortness of breath. 07/07/14   Quintella Reichert, MD  benzonatate (TESSALON) 100 MG capsule Take 1 capsule (100 mg total) by mouth every 8 (eight) hours. 01/13/16   Alfonzo Beers, MD  diltiazem (CARDIZEM CD) 120 MG 24 hr capsule Take 1 capsule (120 mg total) by mouth daily. 02/10/16   Karma Greaser, MD  hydrochlorothiazide (HYDRODIURIL) 25 MG tablet Take 25 mg by mouth daily. 10/31/15   Historical Provider, MD  hydrocortisone cream 1 % Apply to affected area 2 times daily 02/10/16   Karma Greaser, MD  loratadine (CLARITIN) 10 MG tablet Take 1 tablet (10 mg total) by mouth daily. Patient taking differently: Take 10 mg by mouth daily as needed for allergies.  11/28/15   Leo Grosser, MD   omeprazole (PRILOSEC) 20 MG capsule Take 1 capsule (20 mg total) by mouth daily. 11/28/15   Leo Grosser, MD  polyethylene glycol powder [facility]) powder Take 255 g by mouth daily as needed for mild constipation. 11/28/15   Leo Grosser, MD  potassium chloride SA (K-DUR,KLOR-CON) 20 MEQ tablet Take 20 mEq by mouth 2 (two) times daily. 11/12/15   Historical Provider, MD  rivaroxaban (XARELTO) 20 MG TABS tablet Take 1 tablet (20 mg total) by mouth daily with supper. 06/10/15   Nita Sells, MD  Skin Protectants, Misc. (EUCERIN) cream Apply topically as needed for wound care. 01/22/16   Carmin Muskrat, MD  tiotropium (SPIRIVA HANDIHALER) 18 MCG inhalation capsule Place 1 capsule (18 mcg total) into inhaler and inhale daily. 06/22/13   Lacretia Leigh, MD   BP 91/63 mmHg  Pulse 82  Temp(Src) 97.8 F (36.6 C) (Oral)  Resp 20  SpO2 96% Physical Exam  Constitutional: She is oriented to person, place, and time. She appears well-developed and well-nourished. No distress.  HENT:  Head: Normocephalic.  Eyes: Conjunctivae are normal. Pupils are equal, round, and reactive to light. No scleral icterus.  Neck: Normal range of motion. Neck supple. No thyromegaly present.  Cardiovascular: An irregularly irregular rhythm present. Tachycardia present.  Exam reveals no gallop and no friction rub.   No murmur heard. A. fib with RVR monitor. No ST changes.  Pulmonary/Chest: Effort normal and breath sounds normal. No respiratory distress. She has no wheezes. She has no rales.  Abdominal: Soft. Bowel sounds are normal. She exhibits no distension. There is no tenderness. There is no rebound.  Musculoskeletal: Normal range of motion.  Neurological: She is alert and oriented to person, place, and time.  Skin: Skin is warm and dry. No rash noted.  Psychiatric: She has a normal mood and affect. Her behavior is normal.    ED Course  Procedures (including critical care time) Labs Review Labs Reviewed  CBC -  Abnormal; Notable for the following:    RBC 5.16 (*)    RDW 15.8 (*)    All other components within normal limits  BASIC METABOLIC PANEL  I-STAT TROPOININ, ED    Imaging Review Dg Chest 2 View  02/10/2016  CLINICAL DATA:  65 year old female with chest pain and tachycardia since noon today. Initial encounter. EXAM: CHEST  2 VIEW COMPARISON:  01/22/2016 and earlier. FINDINGS: Stable mild cardiomegaly. Other mediastinal contours are within normal limits. Visualized tracheal air column is within normal limits. No pneumothorax, pulmonary edema, pleural effusion or confluent pulmonary opacity. Chronic hyperinflation appears stable since 2015. No acute osseous abnormality identified. IMPRESSION: Chronic hyperinflation and mild cardiomegaly. No acute cardiopulmonary abnormality. Electronically Signed   By: Genevie Ann M.D.   On: 02/10/2016 15:00   I have personally reviewed and evaluated these images and lab results as part of my medical decision-making.   EKG Interpretation   Date/Time:  Thursday February 12 2016 11:15:27 EDT Ventricular Rate:  160 PR Interval:    QRS Duration: 76 QT Interval:  292 QTC Calculation: 476 R Axis:   74  Text Interpretation:  A. flutter with variable AV conduction and RVR  Abnormal ECG Confirmed by Jeneen Rinks  MD, [place] ([id number]) on 02/12/2016 11:21:00 AM      MDM   Final diagnoses:  Atrial fibrillation with RVR (HCC)    Cardizem bolus given. Cardizem infusion started. Patient has slowing her rate in the 70s and 80s. Grip was able to be weaned but not discontinued. Maintains a blood pressure. Does not convert to sinus  rhythm as yet.  CRITICAL CARE Performed by: Tanna Furry JOSEPH   Total critical care time: 30 minutes  Critical care time was exclusive of separately billable procedures and treating other patients.  Critical care was necessary to treat or prevent imminent or life-threatening deterioration.  Critical care was time spent personally by me on the  following activities: development of treatment plan with patient and/or surrogate as well as nursing, discussions with consultants, evaluation of patient's response to treatment, examination of patient, obtaining history from patient or surrogate, ordering and performing treatments and interventions, ordering and review of laboratory studies, ordering and review of radiographic studies, pulse oximetry and re-evaluation of patient's condition.   I've asked Cardiology to consult on her. She has had multiple recent ER visits in and out of atrial fib.    Tanna Furry, MD 02/12/16 1426

## 2016-02-13 IMAGING — US US PELVIS COMPLETE
1 series · 13 of 25 positions shown · non-contrast
Comparison: 01/23/2013

CLINICAL DATA: Postmenopausal bleeding.



[Series 1: us pelvis complete · 0.15mm/px · 13 of 46 slices shown]
[im 1/46]
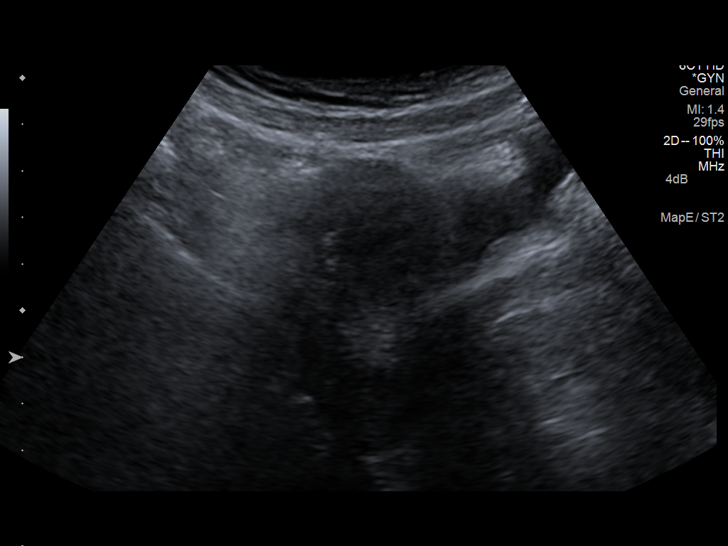
[im 4/46]
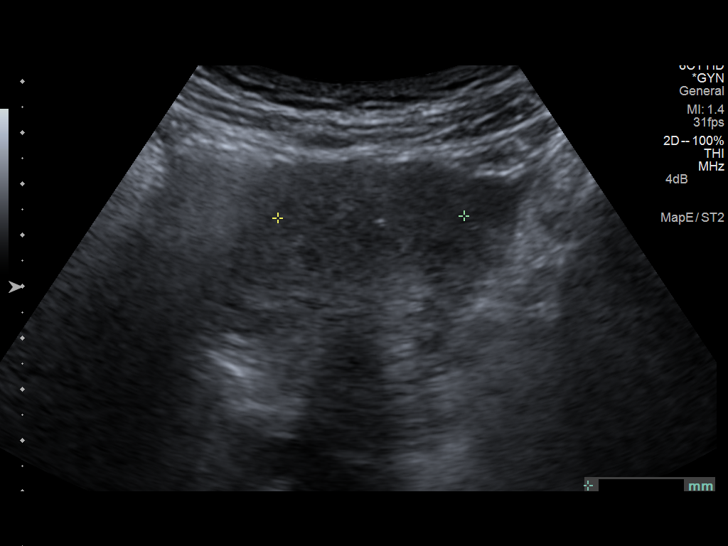
[im 8/46]
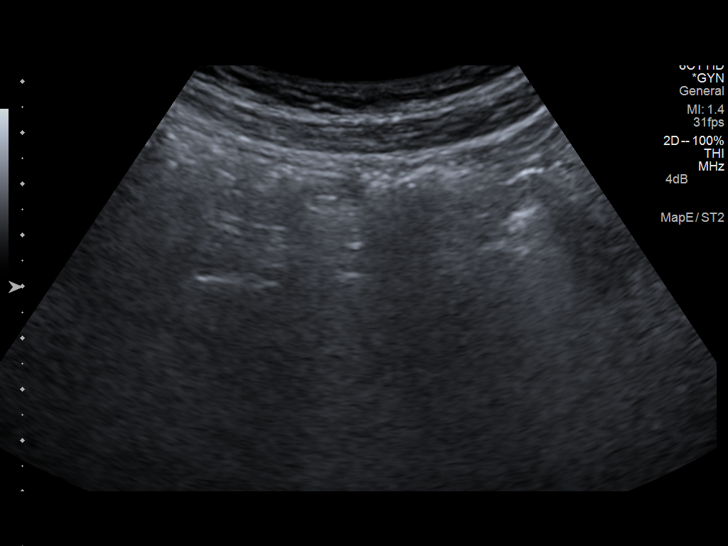
[im 12/46]
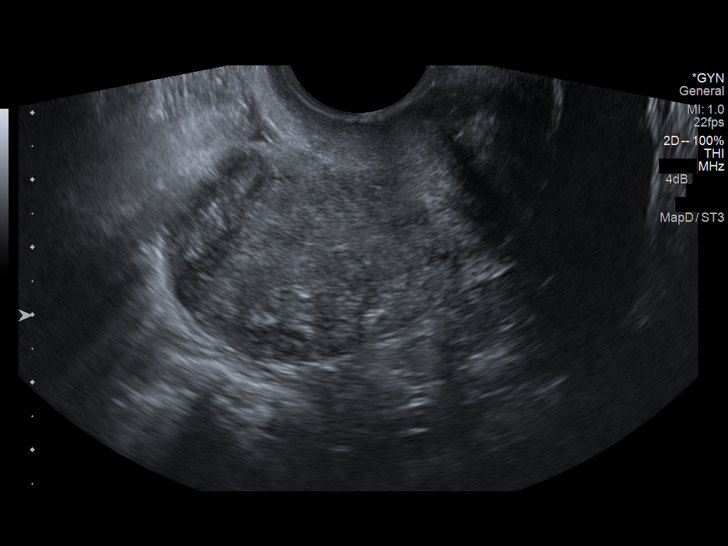
[im 16/46]
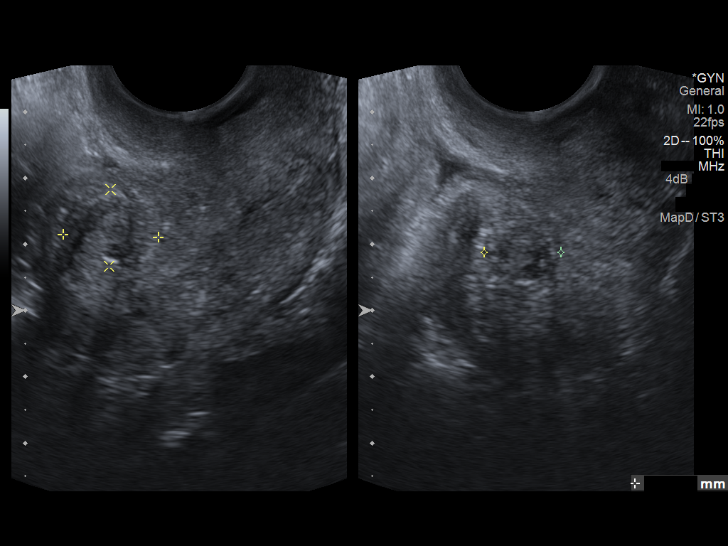
[im 19/46]
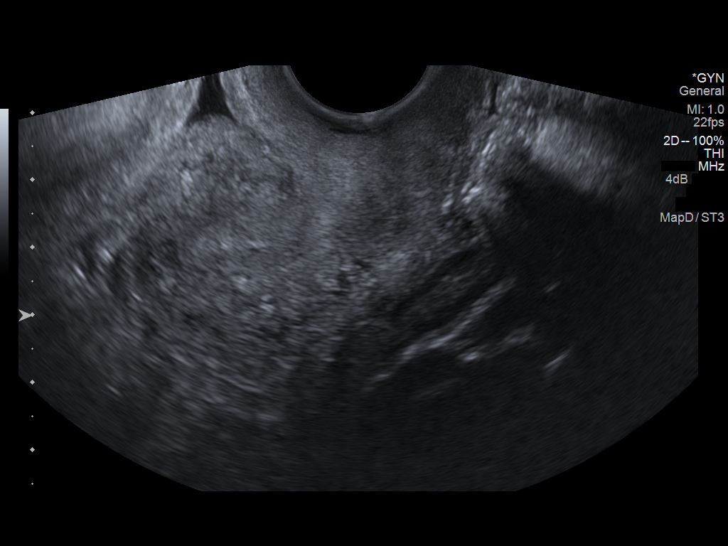
[im 23/46]
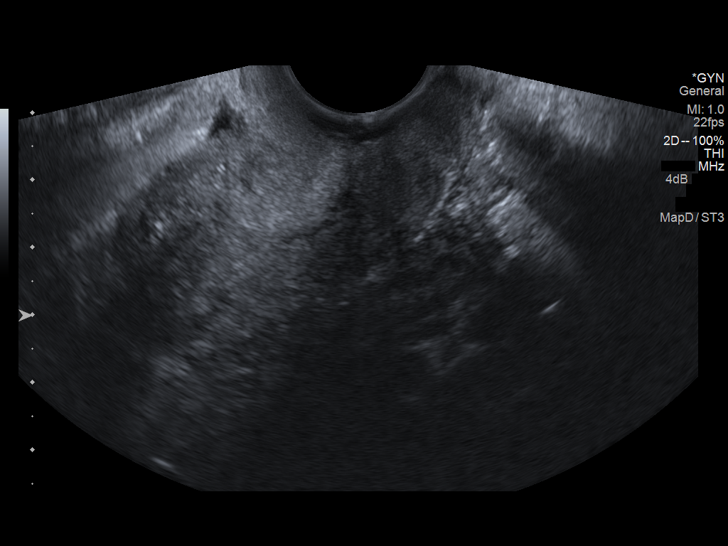
[im 27/46]
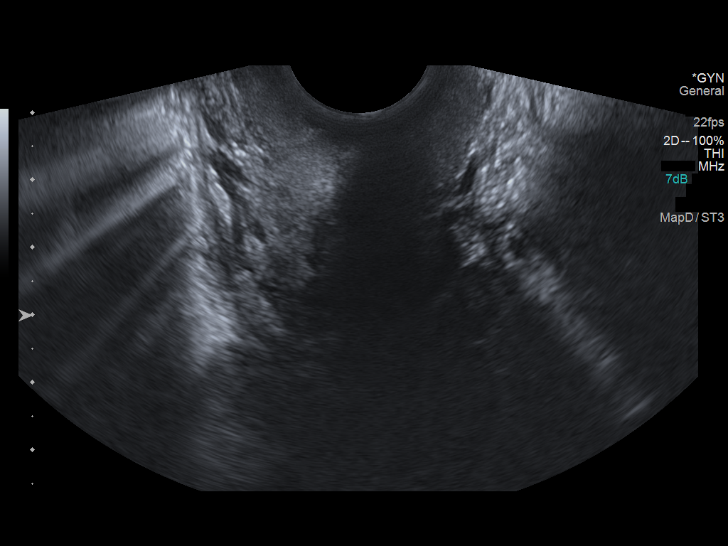
[im 31/46]
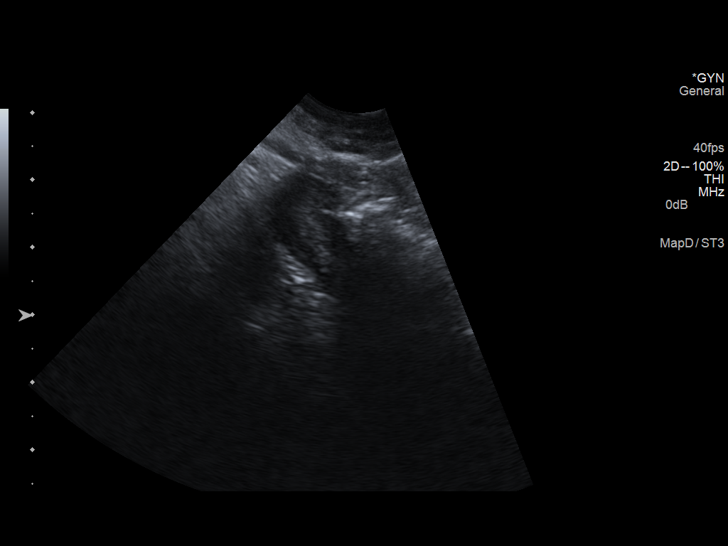
[im 34/46]
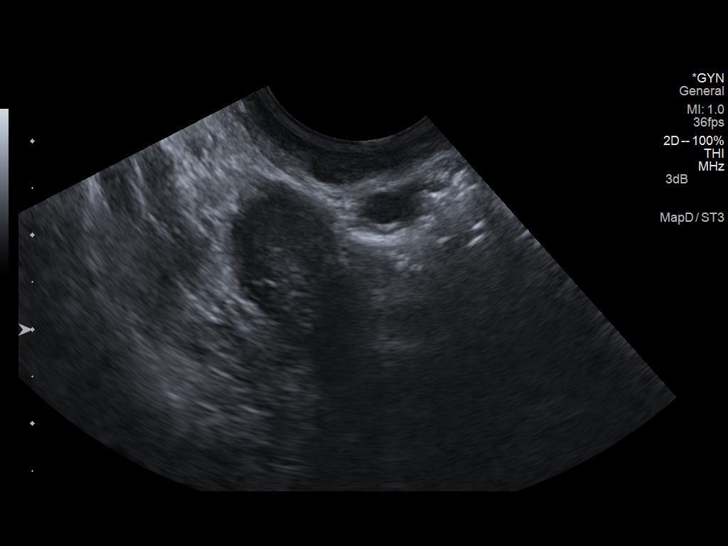
[im 38/46]
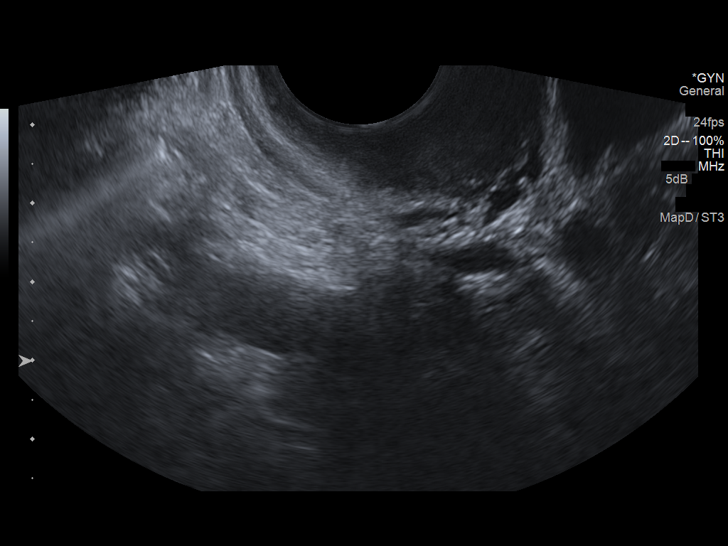
[im 42/46]
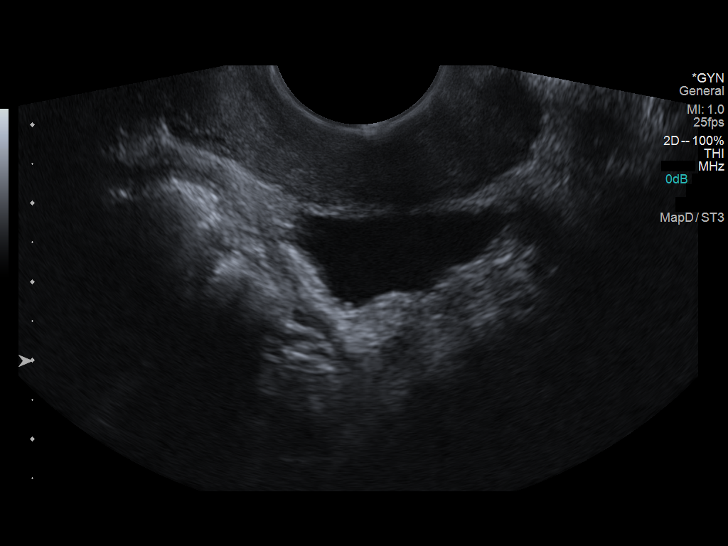
[im 46/46]
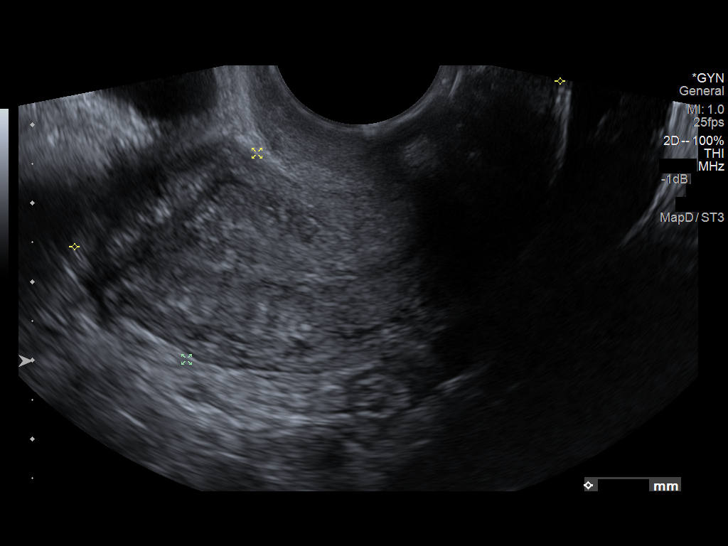

[13 of 25 positions shown; findings below may reference images not displayed]

FINDINGS: Uterus

Measurements: 7.0 by 2.8 by 4.2 cm.. In the left anterior uterine
fundus, a 1.4 by 1.1 by 1.1 cm mass is compatible with fibroid. In
the left anterior uterine body, a 1.1 by 0.9 by 1.0 cm similar mass
representing intramural fibroid is present.

Endometrium

Thickness: Much of the endometrial thickness is about 2 mm.
Questionable focal endometrial thickening 2 6 mm near the fundal
extent of the endometrium on image 15.

Right ovary

Measurements: 1.9 by 1.0 by 1.0 cm. Normal appearance/no adnexal
mass.

Left ovary

Measurements: 1.7 by 1.0 by 1.2 cm. Normal appearance/no adnexal
mass.

Other findings

Small amount of free pelvic fluid.
IMPRESSION: 1. Most of the endometrium is not thickened at about 2 mm, but there
is questionable focus of thicker endometrium near the fundal margin
of the uterus measuring up to 6 mm on a single image. I am uncertain
whether this is a true finding, but given the post menopausal
vaginal bleeding, this may warrant further workup by
sonohysterography or endometrial biopsy.
2. Small amount of free pelvic fluid.
3. Two small intramural masses in the left-sided uterus, favoring
fibroids.

## 2016-02-17 ENCOUNTER — Encounter (HOSPITAL_COMMUNITY): Payer: Self-pay | Admitting: Nurse Practitioner

## 2016-02-17 ENCOUNTER — Ambulatory Visit (HOSPITAL_COMMUNITY)
Admit: 2016-02-17 | Discharge: 2016-02-17 | Disposition: A | Discharge status: Home / Self Care | Payer: Medicare Other | Attending: Nurse Practitioner | Admitting: Nurse Practitioner

## 2016-02-17 VITALS — BP 118/80 | HR 72 | Ht 67.0 in | Wt 119.2 lb

## 2016-02-17 DIAGNOSIS — J45909 Unspecified asthma, uncomplicated: Secondary | ICD-10-CM | POA: Insufficient documentation

## 2016-02-17 DIAGNOSIS — I10 Essential (primary) hypertension: Secondary | ICD-10-CM | POA: Diagnosis not present

## 2016-02-17 DIAGNOSIS — E876 Hypokalemia: Secondary | ICD-10-CM | POA: Insufficient documentation

## 2016-02-17 DIAGNOSIS — F329 Major depressive disorder, single episode, unspecified: Secondary | ICD-10-CM | POA: Diagnosis not present

## 2016-02-17 DIAGNOSIS — Z7901 Long term (current) use of anticoagulants: Secondary | ICD-10-CM | POA: Insufficient documentation

## 2016-02-17 DIAGNOSIS — G43909 Migraine, unspecified, not intractable, without status migrainosus: Secondary | ICD-10-CM | POA: Diagnosis not present

## 2016-02-17 DIAGNOSIS — I480 Paroxysmal atrial fibrillation: Secondary | ICD-10-CM | POA: Diagnosis not present

## 2016-02-17 DIAGNOSIS — Z87891 Personal history of nicotine dependence: Secondary | ICD-10-CM | POA: Insufficient documentation

## 2016-02-17 DIAGNOSIS — K219 Gastro-esophageal reflux disease without esophagitis: Secondary | ICD-10-CM | POA: Diagnosis not present

## 2016-02-17 MED ORDER — DILTIAZEM HCL ER COATED BEADS 120 MG PO CP24: 120.0000 mg | ORAL_CAPSULE | Freq: Every day | ORAL | Status: DC

## 2016-02-18 ENCOUNTER — Encounter (HOSPITAL_COMMUNITY): Payer: Self-pay | Admitting: Nurse Practitioner

## 2016-02-18 ENCOUNTER — Ambulatory Visit: Payer: Medicare Other | Admitting: Physician Assistant

## 2016-02-18 NOTE — Progress Notes (Signed)
Patient ID: [name], female   DOB: Sep 01, 1950, 65 y.o.   MRN: [id number]    Primary Care Physician: Gwenyth Bender, MD Referring Physician: [facility] ER f/u   [name] is a 65 y.o. female with a h/o PAF, diagnosed 11/16, that is in the afib clinic for multiple visits to the ER for epioses of afib as follows,4/3; A. fib, converted, 4/5: A. fib, admitted on Cardizem drip, discharged on beta blocker, plus Cardizem,4/11: Symptomatic with palpitations and lightheadedness. Converted from A fib with BB,discontinued due to dizziness and discharged,5/23: Atrial fibrillation6/1: A. fib, converted,6/21: A. fib, converted with Cardizem drip, discharged.6/22 Returned to ER, flecainide started.  In the clinic today, she is SR after starting flecainide 50 mg bid. She has stopped daily Cardizem from confusion in instructions but was told to restart this with flecainide and the role of the prn Cardizem drug was discussed. No alcohol, smoking, excessive caffeine, no illicit drugs. She walks a lot and is not overweight. No snoring.She is on eliquis from last fall for a chadsvsasc score of 3.  Today, she denies symptoms of palpitations, chest pain, shortness of breath, orthopnea, PND, lower extremity edema, dizziness, presyncope, syncope, or neurologic sequela. The patient is tolerating medications without difficulties and is otherwise without complaint today.   Past Medical History  Diagnosis Date  . Asthma   . Hypertension   . GERD (gastroesophageal reflux disease)   . A-fib (HCC)   . Migraine   . Depression   . Acute hypokalemia 11/2015  . Chronic anticoagulation    Past Surgical History  Procedure Laterality Date  . Tubal ligation    . Tonsillectomy      Current Outpatient Prescriptions  Medication Sig Dispense Refill  . albuterol (PROVENTIL HFA;VENTOLIN HFA) 108 (90 Base) MCG/ACT inhaler Inhale 2 puffs into the lungs every 6 (six) hours as needed for wheezing or shortness  of breath.    . diltiazem (CARDIZEM) 60 MG tablet 1 tablet as needed for sensation of rapid heartbeat. 10 tablet 0  . flecainide (TAMBOCOR) 50 MG tablet Take 1 tablet (50 mg total) by mouth 2 (two) times daily. 60 tablet 0  . hydrochlorothiazide (HYDRODIURIL) 25 MG tablet Take 25 mg by mouth daily.  0  . hydrocortisone cream 1 % Apply to affected area 2 times daily 15 g 0  . omeprazole (PRILOSEC) 20 MG capsule Take 1 capsule (20 mg total) by mouth daily. 30 capsule 0  . potassium chloride SA (K-DUR,KLOR-CON) 20 MEQ tablet Take 20 mEq by mouth 2 (two) times daily.  2  . rivaroxaban (XARELTO) 20 MG TABS tablet Take 1 tablet (20 mg total) by mouth daily with supper. 30 tablet 0  . Skin Protectants, Misc. (EUCERIN) cream Apply topically as needed for wound care. 397 g 0  . albuterol (PROVENTIL) (2.5 MG/3ML) 0.083% nebulizer solution Take 3 mLs (2.5 mg total) by nebulization every 6 (six) hours as needed for wheezing or shortness of breath. (Patient not taking: Reported on 02/17/2016) 75 mL 12  . diltiazem (CARDIZEM CD) 120 MG 24 hr capsule Take 1 capsule (120 mg total) by mouth daily. 30 capsule 6  . [DISCONTINUED] apixaban (ELIQUIS) 5 MG TABS tablet Take 1 tablet (5 mg total) by mouth 2 (two) times daily. (Patient not taking: Reported on 07/07/2014) 60 tablet 0   No current facility-administered medications for this encounter.    Allergies  Allergen Reactions  . Protonix [Pantoprazole Sodium]     Heart races  . Celecoxib  Rash    Social History   Social History  . Marital Status: Single    Spouse Name: N/A  . Number of Children: N/A  . Years of Education: N/A   Occupational History  . Not on file.   Social History Main Topics  . Smoking status: Former Smoker    Types: Cigarettes    Quit date: 10/24/2015  . Smokeless tobacco: Never Used  . Alcohol Use: No  . Drug Use: Yes    Special: Marijuana     Comment: Smoked marijuana in the past. Smoked crack cocaine in the past. Quit 22  years ago.  Marland Kitchen Sexual Activity: Not on file   Other Topics Concern  . Not on file   Social History Narrative   Lives in [place], [place] with daughter.     Family History  Problem Relation Age of Onset  . Cancer Mother   . Cancer Father   . Heart attack Mother 69    ROS- All systems are reviewed and negative except as per the HPI above  Physical Exam: Filed Vitals:   02/17/16 1319  BP: 118/80  Pulse: 72  Height: 5\' 7"  (1.702 m)  Weight: 119 lb 3.2 oz (54.069 kg)    GEN- The patient is well appearing, alert and oriented x 3 today.   Head- normocephalic, atraumatic Eyes-  Sclera clear, conjunctiva pink Ears- hearing intact Oropharynx- clear Neck- supple, no JVP Lymph- no cervical lymphadenopathy Lungs- Clear to ausculation bilaterally, normal work of breathing Heart- Regular rate and rhythm, no murmurs, rubs or gallops, PMI not laterally displaced GI- soft, NT, ND, + BS Extremities- no clubbing, cyanosis, or edema MS- no significant deformity or atrophy Skin- no rash or lesion Psych- euthymic mood, full affect Neuro- strength and sensation are intact  EKG-NSR at 72 bpm, pr int 142 ms, qrs int 88 ms, qtc 440 ms Epic records reviewed Echo--2016 TEE Left ventricle: The cavity size was normal. Systolic function was  normal. The estimated ejection fraction was in the range of 60%  to 65%. Wall motion was normal; there were no regional wall  motion abnormalities. Doppler parameters are consistent with  abnormal left ventricular relaxation (grade 1 diastolic  dysfunction). - Tricuspid valve: There was mild-moderate regurgitation directed  centrally. 2015- TTE- - Left ventricle: The cavity size was normal. Wall thickness was increased in a pattern of mild LVH. The estimated ejection fraction was 65%. Wall motion was normal; there were no regional wall motion abnormalities. - Right ventricle: The cavity size was normal. Systolic function was normal. -  Pulmonary arteries: PA peak pressure: 31 mm Hg (S).   4/17- Myoview No T wave inversion was noted during stress.  There was no ST segment deviation noted during stress.  There is a medium defect of mild severity present in the basal inferoseptal, mid inferoseptal and apical septal location. The defect is non-reversible. This is most consistent with extracardiac activity on both rest and stress. No ischemia noted.  This is a low risk study.  The left ventricular ejection fraction is normal (55-65%). Nuclear stress EF: 58%  Assessment and Plan: 1. PAF Continue flecainide 50 mg bid Continue cardizem 120 mg qd Has cardizem 60 mg to use as needed for afib episodes Long discussion with pt and daughter re afib, triggers, treatment and when appropriate to go to ER and how to treat outbreaks at home.  F/u one month, sooner if needed  Lupita Leash [facility] [address]  [address] [place], [place] [address] [phone]

## 2016-03-17 ENCOUNTER — Encounter: Payer: Self-pay | Admitting: Internal Medicine

## 2016-03-17 ENCOUNTER — Ambulatory Visit (INDEPENDENT_AMBULATORY_CARE_PROVIDER_SITE_OTHER): Payer: Medicare Other | Admitting: Internal Medicine

## 2016-03-17 ENCOUNTER — Encounter (HOSPITAL_COMMUNITY): Payer: Self-pay | Admitting: Nurse Practitioner

## 2016-03-17 ENCOUNTER — Ambulatory Visit (HOSPITAL_COMMUNITY)
Admission: RE | Admit: 2016-03-17 | Discharge: 2016-03-17 | Disposition: A | Discharge status: Home / Self Care | Payer: Medicare Other | Source: Ambulatory Visit | Attending: Nurse Practitioner | Admitting: Nurse Practitioner

## 2016-03-17 VITALS — BP 162/88 | HR 51 | Ht 67.0 in | Wt 125.2 lb

## 2016-03-17 VITALS — BP 151/75 | HR 53 | Temp 98.2°F | Wt 126.8 lb

## 2016-03-17 DIAGNOSIS — Z8249 Family history of ischemic heart disease and other diseases of the circulatory system: Secondary | ICD-10-CM | POA: Diagnosis not present

## 2016-03-17 DIAGNOSIS — Z803 Family history of malignant neoplasm of breast: Secondary | ICD-10-CM | POA: Diagnosis not present

## 2016-03-17 DIAGNOSIS — I480 Paroxysmal atrial fibrillation: Secondary | ICD-10-CM | POA: Diagnosis not present

## 2016-03-17 DIAGNOSIS — I159 Secondary hypertension, unspecified: Secondary | ICD-10-CM

## 2016-03-17 DIAGNOSIS — J309 Allergic rhinitis, unspecified: Secondary | ICD-10-CM | POA: Diagnosis not present

## 2016-03-17 DIAGNOSIS — R739 Hyperglycemia, unspecified: Secondary | ICD-10-CM

## 2016-03-17 DIAGNOSIS — Z79899 Other long term (current) drug therapy: Secondary | ICD-10-CM | POA: Insufficient documentation

## 2016-03-17 DIAGNOSIS — F430 Acute stress reaction: Secondary | ICD-10-CM | POA: Diagnosis not present

## 2016-03-17 DIAGNOSIS — L989 Disorder of the skin and subcutaneous tissue, unspecified: Secondary | ICD-10-CM

## 2016-03-17 DIAGNOSIS — E785 Hyperlipidemia, unspecified: Secondary | ICD-10-CM | POA: Diagnosis not present

## 2016-03-17 DIAGNOSIS — K219 Gastro-esophageal reflux disease without esophagitis: Secondary | ICD-10-CM | POA: Insufficient documentation

## 2016-03-17 DIAGNOSIS — I10 Essential (primary) hypertension: Secondary | ICD-10-CM | POA: Insufficient documentation

## 2016-03-17 DIAGNOSIS — Z124 Encounter for screening for malignant neoplasm of cervix: Secondary | ICD-10-CM | POA: Insufficient documentation

## 2016-03-17 DIAGNOSIS — Z748 Other problems related to care provider dependency: Secondary | ICD-10-CM | POA: Diagnosis not present

## 2016-03-17 DIAGNOSIS — Z7901 Long term (current) use of anticoagulants: Secondary | ICD-10-CM

## 2016-03-17 DIAGNOSIS — L300 Nummular dermatitis: Secondary | ICD-10-CM | POA: Insufficient documentation

## 2016-03-17 DIAGNOSIS — E876 Hypokalemia: Secondary | ICD-10-CM | POA: Diagnosis not present

## 2016-03-17 DIAGNOSIS — Z8639 Personal history of other endocrine, nutritional and metabolic disease: Secondary | ICD-10-CM

## 2016-03-17 DIAGNOSIS — Z888 Allergy status to other drugs, medicaments and biological substances status: Secondary | ICD-10-CM | POA: Insufficient documentation

## 2016-03-17 DIAGNOSIS — J45909 Unspecified asthma, uncomplicated: Secondary | ICD-10-CM | POA: Diagnosis not present

## 2016-03-17 DIAGNOSIS — Z1239 Encounter for other screening for malignant neoplasm of breast: Secondary | ICD-10-CM

## 2016-03-17 DIAGNOSIS — L309 Dermatitis, unspecified: Secondary | ICD-10-CM

## 2016-03-17 DIAGNOSIS — Z1211 Encounter for screening for malignant neoplasm of colon: Secondary | ICD-10-CM | POA: Insufficient documentation

## 2016-03-17 LAB — GLUCOSE, CAPILLARY: GLUCOSE-CAPILLARY: 77 mg/dL (ref 65–99)

## 2016-03-17 LAB — POCT GLYCOSYLATED HEMOGLOBIN (HGB A1C): Hemoglobin A1C: 5.3

## 2016-03-17 MED ORDER — TIOTROPIUM BROMIDE MONOHYDRATE 18 MCG IN CAPS: 18.0000 ug | ORAL_CAPSULE | Freq: Every day | RESPIRATORY_TRACT | 12 refills | Status: DC

## 2016-03-17 MED ORDER — TRIAMCINOLONE ACETONIDE 0.025 % EX CREA: TOPICAL_CREAM | Freq: Two times a day (BID) | CUTANEOUS | Status: DC | PRN

## 2016-03-17 MED ORDER — HYDROCHLOROTHIAZIDE 25 MG PO TABS: 25.0000 mg | ORAL_TABLET | Freq: Every day | ORAL | 1 refills | Status: DC

## 2016-03-17 NOTE — Assessment & Plan Note (Addendum)
Assessment For the last 2 months, she describes acute onset of a skin lesion on her right lower extremity. It is accompanied by itching, and she has never had a similar lesion to this in the past. She feels this may be associated with her chronic anticoagulation. She was prescribed a cream which she could not identify to me today, and I could not identify with her assistance while we reviewed her chart. In any case, it appears to it has not alleviated her symptoms. She does acknowledge symptoms of allergic rhinitis, like congestion, rhinorrhea.  Given her comorbid allergic rhinitis and the appearance of these lesions, eczema is the suspected diagnosis. Itching would be odd to expect with any ecchymosis associated with her anticoagulation.  Plan -Prescribed triamcinolone 0.025% cream to be applied 2 times daily as needed since it is low potency -Counseled the patient on self-care and to avoid excessive moisture which could further aggravate her itching -Follow-up with new PCP in 4 weeks at which time we can consider referral to dermatology if her lesion is not improving

## 2016-03-17 NOTE — Assessment & Plan Note (Addendum)
Assessment Her blood pressure today is elevated at 151/75. She has been controlled on HCTZ 25 mg to be taken one tablet daily. She is asking for refill of this medication today she ran out. She does not report signs of hypertensive urgency, like headache, chest pain, difficulty breathing, blurry vision.  BMET was recently performed one month ago was reassuring for electrolyte abnormalities. 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  Plan -Refilled HCTZ 25 mg to be taken 1 tablet daily -Check lipid panel as she has not had one checked since 2011 to risk stratify her for CVD and primary prevention

## 2016-03-17 NOTE — Progress Notes (Addendum)
Patient ID: [name], female   DOB: Nov 19, 1950, 65 y.o.   MRN: [id number]    Primary Care Physician: [name], MD Referring Physician: [facility] ER f/u   [name] is a 65 y.o. female with a h/o PAF, diagnosed 11/16, that is in the afib clinic for multiple visits to the ER for epioses of afib as follows,4/3; A. fib, converted, 4/5: A. fib, admitted on Cardizem drip, discharged on beta blocker, plus Cardizem,4/11: Symptomatic with palpitations and lightheadedness. Converted from A fib with BB,discontinued due to dizziness and discharged,5/23: Atrial fibrillation6/1: A. fib, converted,6/21: A. fib, converted with Cardizem drip, discharged.6/22 Returned to ER, flecainide started.  In the clinic today, she is SR after starting flecainide 50 mg bid. She has stopped daily Cardizem from confusion in instructions but was told to restart this with flecainide and the role of the prn Cardizem drug was discussed. No alcohol, smoking, excessive caffeine, no illicit drugs. She walks a lot and is not overweight. No snoring.She is on eliquis from last fall for a chadsvsasc score of 3.  She returns to the afib clinic today and reports having some skips in her heart that she took  her prn cardizem, which resolved her symptoms. She says she is aware of her heart when she gets upset but no sustained episodes of afib.Compliant with her flecainide and xarelto. No further visits to the ER. Establishing with Internal Medicine clinic at [facility] today.  Today, she denies symptoms of palpitations, chest pain, shortness of breath, orthopnea, PND, lower extremity edema, dizziness, presyncope, syncope, or neurologic sequela. The patient is tolerating medications without difficulties and is otherwise without complaint today.   Past Medical History:  Diagnosis Date  . A-fib ([place])   . Asthma   . Chronic anticoagulation   . GERD (gastroesophageal reflux disease)   . Hypertension    Past  Surgical History:  Procedure Laterality Date  . TONSILLECTOMY    . TUBAL LIGATION      Current Outpatient Prescriptions  Medication Sig Dispense Refill  . albuterol (PROVENTIL HFA;VENTOLIN HFA) 108 (90 Base) MCG/ACT inhaler Inhale 2 puffs into the lungs every 6 (six) hours as needed for wheezing or shortness of breath.    Marland Kitchen albuterol (PROVENTIL) (2.5 MG/3ML) 0.083% nebulizer solution Take 3 mLs (2.5 mg total) by nebulization every 6 (six) hours as needed for wheezing or shortness of breath. 75 mL 12  . diltiazem (CARDIZEM CD) 120 MG 24 hr capsule Take 1 capsule (120 mg total) by mouth daily. 30 capsule 6  . diltiazem (CARDIZEM) 60 MG tablet 1 tablet as needed for sensation of rapid heartbeat. 10 tablet 0  . flecainide (TAMBOCOR) 50 MG tablet Take 1 tablet (50 mg total) by mouth 2 (two) times daily. 60 tablet 0  . hydrochlorothiazide (HYDRODIURIL) 25 MG tablet Take 25 mg by mouth daily.  0  . potassium chloride SA (K-DUR,KLOR-CON) 20 MEQ tablet Take 20 mEq by mouth 2 (two) times daily.  2  . rivaroxaban (XARELTO) 20 MG TABS tablet Take 1 tablet (20 mg total) by mouth daily with supper. 30 tablet 0  . Skin Protectants, Misc. (EUCERIN) cream Apply topically as needed for wound care. 397 g 0  . tiotropium (SPIRIVA HANDIHALER) 18 MCG inhalation capsule Place 1 capsule (18 mcg total) into inhaler and inhale daily. 30 capsule 12   No current facility-administered medications for this encounter.     Allergies  Allergen Reactions  . Protonix [Pantoprazole Sodium]     Heart  races  . Celecoxib Rash    Social History   Social History  . Marital status: Single    Spouse name: N/A  . Number of children: N/A  . Years of education: N/A   Occupational History  . Not on file.   Social History Main Topics  . Smoking status: Never Smoker  . Smokeless tobacco: Never Used  . Alcohol use No  . Drug use:     Types: Marijuana     Comment: Smoked marijuana in the past.  . Sexual activity: Yes     Partners: Male    Birth control/ protection: Post-menopausal   Other Topics Concern  . Not on file   Social History Narrative   Lives alone in [place], [place] and has 2 daughters who are within driving distance   Enjoys watching soap operas, reading, exercise       Family History  Problem Relation Age of Onset  . Heart attack Mother     [name] age  . Breast cancer Sister     Late 49s; now s/p mastectomy     ROS- All systems are reviewed and negative except as per the HPI above  Physical Exam: Vitals:   03/17/16 0839  BP: (!) 162/88  Pulse: (!) 51  Weight: 125 lb 3.2 oz (56.8 kg)  Height: 5\' 7"  (1.702 m)    GEN- The patient is well appearing, alert and oriented x 3 today.   Head- normocephalic, atraumatic Eyes-  Sclera clear, conjunctiva pink Ears- hearing intact Oropharynx- clear Neck- supple, no JVP Lymph- no cervical lymphadenopathy Lungs- Clear to ausculation bilaterally, normal work of breathing Heart- Regular rate and rhythm, no murmurs, rubs or gallops, PMI not laterally displaced GI- soft, NT, ND, + BS Extremities- no clubbing, cyanosis, or edema MS- no significant deformity or atrophy Skin- no rash or lesion Psych- euthymic mood, full affect Neuro- strength and sensation are intact  EKG-NSR at 51 bpm, pr int 140 ms, qrs int 86 ms, qtc 409 ms Epic records reviewed Echo--2016 TEE Left ventricle: The cavity size was normal. Systolic function was  normal. The estimated ejection fraction was in the range of 60%  to 65%. Wall motion was normal; there were no regional wall  motion abnormalities. Doppler parameters are consistent with  abnormal left ventricular relaxation (grade 1 diastolic  dysfunction). - Tricuspid valve: There was mild-moderate regurgitation directed  centrally. 2015- TTE- - Left ventricle: The cavity size was normal. Wall thickness was increased in a pattern of mild LVH. The estimated ejection fraction was 65%. Wall motion was  normal; there were no regional wall motion abnormalities. - Right ventricle: The cavity size was normal. Systolic function was normal. - Pulmonary arteries: PA peak pressure: 31 mm Hg (S).   4/17- Myoview No T wave inversion was noted during stress.  There was no ST segment deviation noted during stress.  There is a medium defect of mild severity present in the basal inferoseptal, mid inferoseptal and apical septal location. The defect is non-reversible. This is most consistent with extracardiac activity on both rest and stress. No ischemia noted.  This is a low risk study.  The left ventricular ejection fraction is normal (55-65%). Nuclear stress EF: 58%  Assessment and Plan: 1. PAF Doing well staying in SR with flecainide 50 mg bid Continue cardizem 120 mg qd Has cardizem 60 mg to use as needed for afib episodes Long discussion with pt and daughter re afib, triggers, treatment and when appropriate to go to  ER and how to treat outbreaks at home.  Establish with Internal Medicine today as planned  F/u 3 months  Geroge Baseman. [place], [facility] [address] [place],  [address] [phone]

## 2016-03-17 NOTE — Assessment & Plan Note (Signed)
Assessment She is interested today in hearing about resources to assist with transportation. She lives alone though her daughters nearby are not always able to provide her rides to her doctors appointments.  Plan -Spoke with clinic social worker, Harrell Lark, who provided the patient with additional resources to apply for SCAT

## 2016-03-17 NOTE — Assessment & Plan Note (Addendum)
Assessment She expresses worry today that she has had multiple family members, notably her for others sisters, who have passed away from complications arising from cancer. She also notes one of her sisters had breast cancer diagnosed in her late 74s and underwent mastectomy. She denies any symptoms of breast pain, drainage though would like to have a screening mammogram.  Plan -Refer for screening mammogram given high risk family history

## 2016-03-17 NOTE — Assessment & Plan Note (Signed)
Assessment Per review of her lab values, it appears she has had some most of them trending in the 90s though occasional values in the low 100s. She denies any symptoms of hyperglycemia, like blurry vision, polydipsia, polyuria. She is interested in being assessed for diabetes today.  Plan Check A1c  ADDENDUM 03/17/2016  10:41 AM:  A1c 5.3, reassuring.

## 2016-03-17 NOTE — Progress Notes (Addendum)
HPI:  [name] is a 65 y.o. female who presents today for establishing care with our clinic. Please see assessment & plan for status of chronic medical problems.   Past Medical History:  Diagnosis Date  . A-fib ([place])   . Asthma   . Chronic anticoagulation   . GERD (gastroesophageal reflux disease)   . Hypertension    Past Surgical History:  Procedure Laterality Date  . TONSILLECTOMY    . TUBAL LIGATION     Family History  Problem Relation Age of Onset  . Heart attack Mother     [name] age  . Breast cancer Sister     Late 40s; now s/p mastectomy   . Cancer Sister   . Cancer Sister   . Cancer Sister    Social History   Social History  . Marital status: Single    Spouse name: N/A  . Number of children: N/A  . Years of education: N/A   Occupational History  . Not on file.   Social History Main Topics  . Smoking status: Never Smoker  . Smokeless tobacco: Never Used  . Alcohol use No  . Drug use:     Types: Marijuana     Comment: Smoked marijuana in the past.  . Sexual activity: Yes    Partners: Male    Birth control/ protection: Post-menopausal   Other Topics Concern  . Not on file   Social History Narrative   Lives alone in [place], [place] and has 2 daughters who are within driving distance   Enjoys watching soap operas, reading, exercise      Review of Systems:  Please see each problem below for a pertinent review of systems.  Physical Exam:  Vitals:   03/17/16 0930  BP: (!) 151/75  Pulse: (!) 53  Temp: 98.2 F (36.8 C)  TempSrc: Oral  Weight: 126 lb 12.8 oz (57.5 kg)   Constitutional: Middle-aged African-American female with cachectic appearance. No distress.  HEENT: Normocephalic and atraumatic. Conjunctivae are normal.  Cardiovascular: Normal rate, regular rhythm and normal heart sounds.  No gallop, friction rub, murmur heard. Pulmonary/Chest: Effort normal. No respiratory distress. No wheezes, rales.  Neurological: Alert and  oriented to person, place, and time. Coordination normal.  Skin: Hyperpigmented, raised lesion noted overlying the right lower extremity with minor skin breaks secondary to scratching. No erythema noted surrounding the affected area. Similar papule noted on the left lower extremity    Assessment & Plan:   See Encounters Tab for problem based charting.  Hyperglycemia Assessment Per review of her lab values, it appears she has had some most of them trending in the 90s though occasional values in the low 100s. She denies any symptoms of hyperglycemia, like blurry vision, polydipsia, polyuria. She is interested in being assessed for diabetes today.  Plan Check A1c  ADDENDUM 03/17/2016  10:41 AM:  A1c 5.3, reassuring.  Breast cancer screening, high risk patient Assessment She expresses worry today that she has had multiple family members, notably her for others sisters, who have passed away from complications arising from cancer. She also notes one of her sisters had breast cancer diagnosed in her late 70s and underwent mastectomy. She denies any symptoms of breast pain, drainage though would like to have a screening mammogram.  Plan -Refer for screening mammogram given high risk family history  Cervical cancer screening Assessment As noted under breast cancer screening, she has a strong family history of immediate family members who have passed away  from complications arising from cancer. She would like to have a Pap smear today and does not report symptoms of abnormal bleeding, discharge, vaginal pain.  Plan -Defer to PCP at follow-up to perform this test   Hypertension Assessment Her blood pressure today is elevated at 151/75. She has been controlled on HCTZ 25 mg to be taken one tablet daily. She is asking for refill of this medication today she ran out. She does not report signs of hypertensive urgency, like headache, chest pain, difficulty breathing, blurry vision.  BMET was  recently performed one month ago was reassuring for electrolyte abnormalities. 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  Plan -Refilled HCTZ 25 mg to be taken 1 tablet daily -Check lipid panel as she has not had one checked since 2011 to risk stratify her for CVD and primary prevention  Assistance with transportation Assessment She is interested today in hearing about resources to assist with transportation. She lives alone though her daughters nearby are not always able to provide her rides to her doctors appointments.  Plan -Spoke with clinic social worker, Harrell Lark, who provided the patient with additional resources to apply for SCAT  Eczema Assessment For the last 2 months, she describes acute onset of a skin lesion on her right lower extremity. It is accompanied by itching, and she has never had a similar lesion to this in the past. She feels this may be associated with her chronic anticoagulation. She was prescribed a cream which she could not identify to me today, and I could not identify with her assistance while we reviewed her chart. In any case, it appears to it has not alleviated her symptoms. She does acknowledge symptoms of allergic rhinitis, like congestion, rhinorrhea.  Given her comorbid allergic rhinitis and the appearance of these lesions, eczema is the suspected diagnosis. Itching would be odd to expect with any ecchymosis associated with her anticoagulation.  Plan -Prescribed triamcinolone 0.025% cream to be applied 2 times daily as needed since it is low potency -Counseled the patient on self-care and to avoid excessive moisture which could further aggravate her itching -Follow-up with new PCP in 4 weeks at which time we can consider referral to dermatology if her lesion is not improving  Acute stress reaction Assessment She prefers to discuss this matter further with a female provider  but does mention to me today that she becomes anxious and nervous when she is intimate with her female partner whom she is now for the last 30 years. She is concerned that if she picks up the pace of intercourse too quickly, it may increase stress on his heart as he is acutely ill and the setting of cancer. This psychological stress wears on her, and she feels it has contributed to her multiple visits emergency department for atrial fibrillation.  Plan -Reassured her that everyone has sensitive matters like this in the personal life and that it takes a great amount of strength to disclose them to a stranger, like a new doctor. -Request that she be assigned a female PCP so that she may further discuss this matter the suspect she may benefit from some form of CBT for long-term prevention and an anxiolytic for short-term treatment.  Hyperlipidemia ADDENDUM 03/18/2016  9:49 AM:  Per 2013 AHA/ACC ASCVD risk calculator, 10-year risk is 9.7% and 5.8% with optimal risk factors. Moderate to high intensity statin therapy is thus indicated which can be  discussed at follow-up with PCP.    Patient discussed with Dr. Lynnae January

## 2016-03-17 NOTE — Patient Instructions (Addendum)
For your blood pressure medicine, we will refill your medicine.  For the rash, try the cream I prescribed. Please apply no more than two times a day to the leg before your next appointment.   We will work on getting you scheduled for the mammogram.  Please come back in 1 month to follow-up with your regular doctor and pap smear.

## 2016-03-17 NOTE — Assessment & Plan Note (Addendum)
Assessment She prefers to discuss this matter further with a female provider but does mention to me today that she becomes anxious and nervous when she is intimate with her female partner whom she is now for the last 30 years. She is concerned that if she picks up the pace of intercourse too quickly, it may increase stress on his heart as he is acutely ill and the setting of cancer. This psychological stress wears on her, and she feels it has contributed to her multiple visits emergency department for atrial fibrillation.  Plan -Reassured her that everyone has sensitive matters like this in the personal life and that it takes a great amount of strength to disclose them to a stranger, like a new doctor. -Request that she be assigned a female PCP so that she may further discuss this matter the suspect she may benefit from some form of CBT for long-term prevention and an anxiolytic for short-term treatment.

## 2016-03-17 NOTE — Addendum Note (Signed)
Encounter addended by: Sherran Needs, NP on: 03/17/2016 10:15 AM<BR>    Actions taken: Sign clinical note

## 2016-03-17 NOTE — Assessment & Plan Note (Signed)
Assessment As noted under breast cancer screening, she has a strong family history of immediate family members who have passed away from complications arising from cancer. She would like to have a Pap smear today and does not report symptoms of abnormal bleeding, discharge, vaginal pain.  Plan -Defer to PCP at follow-up to perform this test

## 2016-03-18 DIAGNOSIS — E785 Hyperlipidemia, unspecified: Secondary | ICD-10-CM | POA: Insufficient documentation

## 2016-03-18 LAB — LIPID PANEL
CHOLESTEROL TOTAL: 245 mg/dL — AB (ref 100–199)
Chol/HDL Ratio: 2.6 ratio units (ref 0.0–4.4)
HDL: 94 mg/dL (ref >39)
LDL Calculated: 132 mg/dL — ABNORMAL HIGH (ref 0–99)
TRIGLYCERIDES: 95 mg/dL (ref 0–149)
VLDL Cholesterol Cal: 19 mg/dL (ref 5–40)

## 2016-03-18 NOTE — Progress Notes (Signed)
Internal Medicine Clinic Attending  Case discussed with Dr. Patel,Rushil at the time of the visit.  We reviewed the resident's history and exam and pertinent patient test results.  I agree with the assessment, diagnosis, and plan of care documented in the resident's note.  

## 2016-03-18 NOTE — Assessment & Plan Note (Signed)
ADDENDUM 03/18/2016  9:49 AM:  Per 2013 AHA/ACC ASCVD risk calculator, 10-year risk is 9.7% and 5.8% with optimal risk factors. Moderate to high intensity statin therapy is thus indicated which can be discussed at follow-up with PCP.

## 2016-04-01 ENCOUNTER — Other Ambulatory Visit (HOSPITAL_COMMUNITY): Payer: Self-pay | Admitting: *Deleted

## 2016-04-01 MED ORDER — FLECAINIDE ACETATE 50 MG PO TABS: 50.0000 mg | ORAL_TABLET | Freq: Two times a day (BID) | ORAL | 6 refills | Status: DC

## 2016-04-01 MED ORDER — DILTIAZEM HCL 60 MG PO TABS: ORAL_TABLET | ORAL | 2 refills | Status: DC

## 2016-04-16 ENCOUNTER — Encounter: Payer: Self-pay | Admitting: Internal Medicine

## 2016-04-16 ENCOUNTER — Ambulatory Visit (INDEPENDENT_AMBULATORY_CARE_PROVIDER_SITE_OTHER): Payer: Medicare Other | Admitting: Internal Medicine

## 2016-04-16 ENCOUNTER — Encounter: Payer: Self-pay | Admitting: Licensed Clinical Social Worker

## 2016-04-16 VITALS — BP 97/66 | HR 63 | Temp 97.8°F | Ht 67.0 in | Wt 124.0 lb

## 2016-04-16 DIAGNOSIS — I10 Essential (primary) hypertension: Secondary | ICD-10-CM

## 2016-04-16 DIAGNOSIS — Z7901 Long term (current) use of anticoagulants: Secondary | ICD-10-CM

## 2016-04-16 DIAGNOSIS — L309 Dermatitis, unspecified: Secondary | ICD-10-CM

## 2016-04-16 DIAGNOSIS — Z803 Family history of malignant neoplasm of breast: Secondary | ICD-10-CM

## 2016-04-16 DIAGNOSIS — Z79899 Other long term (current) drug therapy: Secondary | ICD-10-CM | POA: Diagnosis not present

## 2016-04-16 DIAGNOSIS — L989 Disorder of the skin and subcutaneous tissue, unspecified: Secondary | ICD-10-CM

## 2016-04-16 DIAGNOSIS — R51 Headache: Secondary | ICD-10-CM | POA: Diagnosis not present

## 2016-04-16 DIAGNOSIS — I480 Paroxysmal atrial fibrillation: Secondary | ICD-10-CM

## 2016-04-16 DIAGNOSIS — J3489 Other specified disorders of nose and nasal sinuses: Secondary | ICD-10-CM | POA: Insufficient documentation

## 2016-04-16 DIAGNOSIS — Z124 Encounter for screening for malignant neoplasm of cervix: Secondary | ICD-10-CM

## 2016-04-16 DIAGNOSIS — E785 Hyperlipidemia, unspecified: Secondary | ICD-10-CM | POA: Diagnosis not present

## 2016-04-16 DIAGNOSIS — Z1239 Encounter for other screening for malignant neoplasm of breast: Secondary | ICD-10-CM

## 2016-04-16 NOTE — Assessment & Plan Note (Signed)
Her BP today was 97/66. She was on HCTZ 25 mg daily. She recently restarted her Lisinopril and getting headaches since then. - We told her to stop Lisinopril and continue HCTZ. - Cardizem also decreases BP.

## 2016-04-16 NOTE — Assessment & Plan Note (Signed)
She had a family H/O breast cancer.and wants a screening mammogram. We ordered a mammogram for her.

## 2016-04-16 NOTE — Assessment & Plan Note (Signed)
Per 2013 AHA/ACC ASCVD risk calculator, 10-year risk is 9.7% and 5.8% with optimal risk factors. Moderate to high intensity statin therapy is thus indicated . We discussed about adding Lipitor with next visit as we do not want to add a new agent due to her headaches and mild dizziness.

## 2016-04-16 NOTE — Patient Instructions (Addendum)
1. See ACC for pap smear and leg biopsy.  2. See Dr Reesa Chew 1-2 months for high blood pressure follow up  3. Stop taking Lisinopril as it might be causing your headache by dropping your BP low.  4. Keep your self hydrated.and use some tylenol for headaches.  5. I am going to send you for mammogram.

## 2016-04-16 NOTE — Assessment & Plan Note (Signed)
Her rate was controlled on Cardizem 120 mg daily , although she was told to have another dose of 60 mg PRN for increased heart beat but didn't require that for some time. She is on Xarelto,Tambocor and Cardizem. -Continue her meds.

## 2016-04-16 NOTE — Assessment & Plan Note (Signed)
We will do a pap smear with HPV screening during next visit.

## 2016-04-16 NOTE — Assessment & Plan Note (Signed)
For the last 2 months, she describes acute onset of a skin lesion on her right lower extremity. It is accompanied by itching, and she has never had a similar lesion to this in the [place] is the suspected diagnosis. She was given a prescription of triamcinolone 0.025% cream to be applied 2 times daily as needed.  She said her itcheness has improved but she was worried about that rash. We discuss about getting a skin biopsy and she agreed on that. -F/U appointment to perform skin biopsy.

## 2016-04-16 NOTE — Progress Notes (Signed)
   CC: Occipital headache  HPI:  Ms.Uchechi L Tivis is a 64 y.o. with PMH of HTN, A-Fib.came today with C/O intermittent occipital nagging headache for last three days, 7-8/10. It started with neck pain and then went to back of her head. She c/o occasional blurry vision with headaches but denies any N/V., photophobia . She does admits having mild postural dizziness and states that she used to have Lisinopril for her BP which causes her headaches and then she stopped taking that. She restarted that couple of days ago. She denies any palpitation,chest pain, dyspnea on exertion, orthopnea. She used to have migraines when she was young but this headache is different. She wants to have pap smear and mammogram done. She was told that she had eczema on her right leg during last visit, her itchiness is better with the medicine but having hyperpigmented lesion and worried about that too.  Past Medical History:  Diagnosis Date  . A-fib ([place])   . Asthma   . Chronic anticoagulation   . GERD (gastroesophageal reflux disease)   . Hypertension     Review of Systems:  As per HPI  Physical Exam:  Vitals:   04/16/16 1338  BP: 97/66  Pulse: 63  Temp: 97.8 F (36.6 C)  TempSrc: Oral  Weight: 124 lb (56.2 kg)  Height: 5\' 7"  (1.702 m)   Vitals:   04/16/16 1338  BP: 97/66  Pulse: 63  Temp: 97.8 F (36.6 C)  TempSrc: Oral  Weight: 124 lb (56.2 kg)  Height: 5\' 7"  (1.702 m)   General: Vital signs reviewed.  Patient is well-developed and well-nourished, in no acute distress and cooperative with exam.  Head: Normocephalic and atraumatic. Eyes: EOMI, conjunctivae normal, no scleral icterus.  Neck: Supple, trachea midline, normal ROM, no JVD, masses, thyromegaly, or carotid bruit present.  Cardiovascular: RRR, S1 normal, S2 normal, no murmurs, gallops, or rubs. Pulmonary/Chest: Clear to auscultation bilaterally, no wheezes, rales, or rhonchi. Abdominal: Soft, non-tender, non-distended, BS +, no  masses, organomegaly, or guarding present.  Musculoskeletal: No joint deformities, erythema, or stiffness, ROM full and nontender. Extremities: No lower extremity edema bilaterally,  pulses symmetric and intact bilaterally. No cyanosis or clubbing. Neurological: A&O x3, Strength is normal and symmetric bilaterally, cranial nerve II-XII are grossly intact, no focal motor deficit, sensory intact to light touch bilaterally.  Skin: Warm, dry and intact. Has a hyperpigmented rash at her lower right leg.No sign of infection,or ozzing. Psychiatric: Normal mood and affect. speech and behavior is normal. Cognition and memory are normal.   Assessment & Plan:   See Encounters Tab for problem based charting.  Patient seen with Dr. Lynnae January

## 2016-04-19 NOTE — Progress Notes (Signed)
[name] met with CSW prior to her scheduled appointment.  Pt states CSW previously provided SCAT application, however, pt messed up and was in need of another form.  CSW provided [name] with new Part A, ROI Part B and self addressed stamped envelope.  [name] declines to complete form today, stating she will have her daughter assist and return to CSW in envelope provided.  [name] denied add'l social work needs at this time.

## 2016-04-19 NOTE — Progress Notes (Signed)
Internal Medicine Clinic Attending  I saw and evaluated the patient.  I personally confirmed the key portions of the history and exam documented by Dr. Amin and I reviewed pertinent patient test results.  The assessment, diagnosis, and plan were formulated together and I agree with the documentation in the resident's note. 

## 2016-04-22 ENCOUNTER — Ambulatory Visit (HOSPITAL_BASED_OUTPATIENT_CLINIC_OR_DEPARTMENT_OTHER)
Admission: RE | Admit: 2016-04-22 | Discharge: 2016-04-22 | Disposition: A | Discharge status: Home / Self Care | Payer: Medicare Other | Source: Ambulatory Visit | Attending: Nurse Practitioner | Admitting: Nurse Practitioner

## 2016-04-22 ENCOUNTER — Encounter (HOSPITAL_COMMUNITY): Payer: Self-pay | Admitting: Nurse Practitioner

## 2016-04-22 ENCOUNTER — Emergency Department (HOSPITAL_COMMUNITY)
Admission: EM | Admit: 2016-04-22 | Discharge: 2016-04-22 | Disposition: A | Discharge status: Home / Self Care | Payer: Medicare Other | Attending: Emergency Medicine | Admitting: Emergency Medicine

## 2016-04-22 ENCOUNTER — Emergency Department (HOSPITAL_COMMUNITY): Payer: Medicare Other

## 2016-04-22 ENCOUNTER — Encounter (HOSPITAL_COMMUNITY): Payer: Self-pay | Admitting: Emergency Medicine

## 2016-04-22 VITALS — BP 146/90 | Ht 67.0 in | Wt 123.2 lb

## 2016-04-22 DIAGNOSIS — Z7901 Long term (current) use of anticoagulants: Secondary | ICD-10-CM | POA: Insufficient documentation

## 2016-04-22 DIAGNOSIS — Z8541 Personal history of malignant neoplasm of cervix uteri: Secondary | ICD-10-CM | POA: Diagnosis not present

## 2016-04-22 DIAGNOSIS — Z853 Personal history of malignant neoplasm of breast: Secondary | ICD-10-CM | POA: Diagnosis not present

## 2016-04-22 DIAGNOSIS — I10 Essential (primary) hypertension: Secondary | ICD-10-CM | POA: Insufficient documentation

## 2016-04-22 DIAGNOSIS — Z79899 Other long term (current) drug therapy: Secondary | ICD-10-CM | POA: Insufficient documentation

## 2016-04-22 DIAGNOSIS — I480 Paroxysmal atrial fibrillation: Secondary | ICD-10-CM

## 2016-04-22 DIAGNOSIS — R51 Headache: Secondary | ICD-10-CM | POA: Diagnosis not present

## 2016-04-22 DIAGNOSIS — R519 Headache, unspecified: Secondary | ICD-10-CM

## 2016-04-22 DIAGNOSIS — J302 Other seasonal allergic rhinitis: Secondary | ICD-10-CM | POA: Diagnosis not present

## 2016-04-22 MED ORDER — HYDROCODONE-ACETAMINOPHEN 5-325 MG PO TABS: 1.0000 | ORAL_TABLET | Freq: Four times a day (QID) | ORAL | 0 refills | Status: DC | PRN

## 2016-04-22 MED ORDER — CETIRIZINE HCL 10 MG PO CAPS: 1.0000 | ORAL_CAPSULE | Freq: Every morning | ORAL | 1 refills | Status: DC

## 2016-04-22 NOTE — ED Provider Notes (Signed)
[facility] DEPT Provider Note   CSN: [id number] Arrival date & time: 04/22/16  [id number]     History   Chief Complaint Chief Complaint  Patient presents with  . Headache    HPI [name] is a 65 y.o. female.  Patient with 2 week history of headache is predominantly in the sinus area. Patient thinks it may be allergy related she's had trouble with that in the past. Patient denies any fevers and denies any significant visual changes other than occasional blurred vision. No neuro focal deficits no weakness no numbness. Headache is 6 out of 10. Patient's been trying Claritin at home for it without any significant improvement.      Past Medical History:  Diagnosis Date  . A-fib ([place])   . Asthma   . Chronic anticoagulation   . GERD (gastroesophageal reflux disease)   . Hypertension     Patient Active Problem List   Diagnosis Date Noted  . Headache 04/16/2016  . Hyperlipidemia 03/18/2016  . Hyperglycemia 03/17/2016  . Breast cancer screening, high risk patient 03/17/2016  . Eczema 03/17/2016  . Cervical cancer screening 03/17/2016  . Assistance with transportation 03/17/2016  . Acute stress reaction 03/17/2016  . Hypertension   . GERD (gastroesophageal reflux disease)   . PAF (paroxysmal atrial fibrillation) ([place]) 11/26/2015  . Chronic anticoagulation 11/26/2015  . Severe malnutrition ([place]) 06/12/2015  . Folliculitis [id number]  . Marijuana abuse 06/11/2015  . Asthma, mild intermittent   . WEIGHT LOSS, ABNORMAL 04/11/2007    Past Surgical History:  Procedure Laterality Date  . TONSILLECTOMY    . TUBAL LIGATION      OB History    No data available       Home Medications    Prior to Admission medications   Medication Sig Start Date End Date Taking? Authorizing Provider  albuterol (PROVENTIL HFA;VENTOLIN HFA) 108 (90 Base) MCG/ACT inhaler Inhale 2 puffs into the lungs every 6 (six) hours as needed for wheezing or shortness of breath.   Yes Historical  Provider, MD  diltiazem (CARDIZEM CD) 120 MG 24 hr capsule Take 1 capsule (120 mg total) by mouth daily. 02/17/16  Yes Sherran Needs, NP  diltiazem (CARDIZEM) 60 MG tablet 1 tablet by mouth every 6 hours AS NEEDED for sensation of rapid heartbeat. 04/01/16  Yes Sherran Needs, NP  flecainide (TAMBOCOR) 50 MG tablet Take 1 tablet (50 mg total) by mouth 2 (two) times daily. 04/01/16  Yes Sherran Needs, NP  hydrochlorothiazide (HYDRODIURIL) 25 MG tablet Take 1 tablet (25 mg total) by mouth daily. 03/17/16  Yes Riccardo Dubin, MD  potassium chloride SA (K-DUR,KLOR-CON) 20 MEQ tablet Take 20 mEq by mouth 2 (two) times daily. 11/12/15  Yes Historical Provider, MD  rivaroxaban (XARELTO) 20 MG TABS tablet Take 1 tablet (20 mg total) by mouth daily with supper. 06/10/15  Yes Nita Sells, MD  Skin Protectants, Misc. (EUCERIN) cream Apply topically as needed for wound care. 01/22/16  Yes Carmin Muskrat, MD  tiotropium (SPIRIVA HANDIHALER) 18 MCG inhalation capsule Place 1 capsule (18 mcg total) into inhaler and inhale daily. 03/17/16  Yes Riccardo Dubin, MD  albuterol (PROVENTIL) (2.5 MG/3ML) 0.083% nebulizer solution Take 3 mLs (2.5 mg total) by nebulization every 6 (six) hours as needed for wheezing or shortness of breath. Patient not taking: Reported on 04/22/2016 07/07/14   Quintella Reichert, MD  Cetirizine HCl 10 MG CAPS Take 1 capsule (10 mg total) by mouth every morning. 04/22/16  Fredia Sorrow, MD  HYDROcodone-acetaminophen (NORCO/VICODIN) 5-325 MG tablet Take 1-2 tablets by mouth every 6 (six) hours as needed for moderate pain. 04/22/16   Fredia Sorrow, MD    Family History Family History  Problem Relation Age of Onset  . Heart attack Mother     [name] age  . Breast cancer Sister     Late 80s; now s/p mastectomy   . Cancer Sister   . Cancer Sister   . Cancer Sister     Social History Social History  Substance Use Topics  . Smoking status: Never Smoker  . Smokeless tobacco: Never Used   . Alcohol use No     Allergies   Protonix [pantoprazole sodium] and Celecoxib   Review of Systems Review of Systems  Constitutional: Negative for fever.  HENT: Positive for congestion and sinus pressure.   Eyes: Negative for redness and itching.  Respiratory: Negative for shortness of breath.   Cardiovascular: Negative for chest pain.  Gastrointestinal: Negative for abdominal pain.  Genitourinary: Negative for dysuria.  Musculoskeletal: Negative for neck pain.  Skin: Negative for rash.  Neurological: Positive for headaches.  Hematological: Does not bruise/bleed easily.  Psychiatric/Behavioral: Negative for confusion.     Physical Exam Updated Vital Signs BP 160/90 (BP Location: Right Arm)   Pulse 62   Temp 98.1 F (36.7 C) (Oral)   Resp 18   SpO2 98%   Physical Exam  Constitutional: She is oriented to person, place, and time. She appears well-developed and well-nourished.  HENT:  Head: Normocephalic and atraumatic.  Mouth/Throat: Oropharynx is clear and moist.  Eyes: Conjunctivae and EOM are normal. Pupils are equal, round, and reactive to light.  Neck: Normal range of motion.  Cardiovascular: Normal rate, regular rhythm and normal heart sounds.   No murmur heard. Pulmonary/Chest: Effort normal and breath sounds normal. No respiratory distress. She has no wheezes.  Abdominal: Soft. Bowel sounds are normal. She exhibits no distension.  Musculoskeletal: Normal range of motion. She exhibits no edema.  Neurological: She is alert and oriented to person, place, and time. No cranial nerve deficit. She exhibits normal muscle tone. Coordination normal.  Skin: Skin is warm. No erythema.  Nursing note and vitals reviewed.    ED Treatments / Results  Labs (all labs ordered are listed, but only abnormal results are displayed) Labs Reviewed - No data to display  EKG  EKG Interpretation None       Radiology Ct Head Wo Contrast  Result Date: 04/22/2016 CLINICAL  DATA:  Headache EXAM: CT HEAD WITHOUT CONTRAST TECHNIQUE: Contiguous axial images were obtained from the base of the skull through the vertex without intravenous contrast. COMPARISON:  None. FINDINGS: Brain: Ventricle size normal. Cerebral volume normal for age. Negative for acute infarct. Negative for hemorrhage or mass. No shift of the midline structures. Vascular: No hyperdense vessel or unexpected calcification. Skull: Negative Sinuses/Orbits: Mild mucosal edema paranasal sinuses.  Normal orbit. Other: Negative soft tissues. IMPRESSION: No acute abnormality Electronically Signed   By: Franchot Gallo M.D.   On: 04/22/2016 09:09    Procedures Procedures (including critical care time)  Medications Ordered in ED Medications - No data to display   Initial Impression / Assessment and Plan / ED Course  I have reviewed the triage vital signs and the nursing notes.  Pertinent labs & imaging results that were available during my care of the patient were reviewed by me and considered in my medical decision making (see chart for details).  Clinical Course  Patient with complaint of headache for 2 weeks more in the sinus area. Head CT negative for any acute sinusitis. Some evidence of a little bit of edema which may be of related to an allergic rhinitis. Patient does have a history of seasonal allergies. Patient without any neural focal deficits. Patient's blood pressure reasonable. Patient be treated with hydrocodone and a trial of Zyrtec. Patient has follow-up with her cardiologist later today. Patient nontoxic no acute distress.  Final Clinical Impressions(s) / ED Diagnoses   Final diagnoses:  Acute intractable headache, unspecified headache type  Seasonal allergies    New Prescriptions New Prescriptions   CETIRIZINE HCL 10 MG CAPS    Take 1 capsule (10 mg total) by mouth every morning.   HYDROCODONE-ACETAMINOPHEN (NORCO/VICODIN) 5-325 MG TABLET    Take 1-2 tablets by mouth every 6 (six)  hours as needed for moderate pain.     Fredia Sorrow, MD 04/22/16 1015

## 2016-04-22 NOTE — ED Notes (Signed)
Patient transported to CT 

## 2016-04-22 NOTE — Progress Notes (Signed)
Patient ID: [name], female   DOB: Jan 18, 1951, 65 y.o.   MRN: [id number]    Primary Care Physician: Arnetha Courser, MD Referring Physician: [facility] ER f/u   [name] is a 65 y.o. female with a h/o PAF, diagnosed 11/16, that is in the afib clinic for multiple visits to the ER for epioses of afib as follows,4/3; A. fib, converted, 4/5: A. fib, admitted on Cardizem drip, discharged on beta blocker, plus Cardizem,4/11: Symptomatic with palpitations and lightheadedness. Converted from A fib with BB,discontinued due to dizziness and discharged,5/23: Atrial fibrillation6/1: A. fib, converted,6/21: A. fib, converted with Cardizem drip, discharged.6/22 Returned to ER, flecainide started.  In the clinic today, she is SR after starting flecainide 50 mg bid. She has stopped daily Cardizem from confusion in instructions but was told to restart this with flecainide and the role of the prn Cardizem drug was discussed. No alcohol, smoking, excessive caffeine, no illicit drugs. She walks a lot and is not overweight. No snoring.She is on eliquis from last fall for a chadsvsasc score of 3.  She returns to the afib clinic 7/26 and reports having some skips in her heart that she took  her prn cardizem, which resolved her symptoms. She says she is aware of her heart when she gets upset but no sustained episodes of afib.Compliant with her flecainide and xarelto. No further visits to the ER. Establishing with Internal Medicine clinic at [facility] today.  F/u 8/31, she was seen in the ER before this appointment for H/A. Started late last week, saw PCP first. When it did not go away, she decided to go to the ER. CT of head negative. Thought possibly secondary to allergies. I doubt flecainide/cardizem since she has been on since June and tolerated well. She has not had any significant afib. Has maybe had to take 1-2 short acting cardizem's for irregular heart beat since seen last.   Today, she  denies symptoms of palpitations, chest pain, shortness of breath, orthopnea, PND, lower extremity edema, dizziness, presyncope, syncope, or neurologic sequela. The patient is tolerating medications without difficulties and is otherwise without complaint today.   Past Medical History:  Diagnosis Date  . A-fib ([facility])   . Asthma   . Chronic anticoagulation   . GERD (gastroesophageal reflux disease)   . Hypertension    Past Surgical History:  Procedure Laterality Date  . TONSILLECTOMY    . TUBAL LIGATION      Current Outpatient Prescriptions  Medication Sig Dispense Refill  . albuterol (PROVENTIL HFA;VENTOLIN HFA) 108 (90 Base) MCG/ACT inhaler Inhale 2 puffs into the lungs every 6 (six) hours as needed for wheezing or shortness of breath.    Marland Kitchen albuterol (PROVENTIL) (2.5 MG/3ML) 0.083% nebulizer solution Take 3 mLs (2.5 mg total) by nebulization every 6 (six) hours as needed for wheezing or shortness of breath. 75 mL 12  . Cetirizine HCl 10 MG CAPS Take 1 capsule (10 mg total) by mouth every morning. 14 capsule 1  . diltiazem (CARDIZEM CD) 120 MG 24 hr capsule Take 1 capsule (120 mg total) by mouth daily. 30 capsule 6  . diltiazem (CARDIZEM) 60 MG tablet 1 tablet by mouth every 6 hours AS NEEDED for sensation of rapid heartbeat. 45 tablet 2  . flecainide (TAMBOCOR) 50 MG tablet Take 1 tablet (50 mg total) by mouth 2 (two) times daily. 60 tablet 6  . hydrochlorothiazide (HYDRODIURIL) 25 MG tablet Take 1 tablet (25 mg total) by mouth daily. 30 tablet 1  .  HYDROcodone-acetaminophen (NORCO/VICODIN) 5-325 MG tablet Take 1-2 tablets by mouth every 6 (six) hours as needed for moderate pain. 10 tablet 0  . potassium chloride SA (K-DUR,KLOR-CON) 20 MEQ tablet Take 20 mEq by mouth 2 (two) times daily.  2  . rivaroxaban (XARELTO) 20 MG TABS tablet Take 1 tablet (20 mg total) by mouth daily with supper. 30 tablet 0  . Skin Protectants, Misc. (EUCERIN) cream Apply topically as needed for wound care. 397 g 0   . tiotropium (SPIRIVA HANDIHALER) 18 MCG inhalation capsule Place 1 capsule (18 mcg total) into inhaler and inhale daily. 30 capsule 12   Current Facility-Administered Medications  Medication Dose Route Frequency Provider Last Rate Last Dose  . triamcinolone (KENALOG) 0.025 % cream   Topical BID PRN Shelby Arbour, MD        Allergies  Allergen Reactions  . Protonix [Pantoprazole Sodium]     Heart races  . Celecoxib Rash    Social History   Social History  . Marital status: Single    Spouse name: N/A  . Number of children: N/A  . Years of education: N/A   Occupational History  . Not on file.   Social History Main Topics  . Smoking status: Never Smoker  . Smokeless tobacco: Never Used  . Alcohol use No  . Drug use:     Types: Marijuana     Comment: Smoked marijuana in the past.  . Sexual activity: Yes    Partners: Male    Birth control/ protection: Post-menopausal   Other Topics Concern  . Not on file   Social History Narrative   Lives alone in [place], [place] and has 2 daughters who are within driving distance   Enjoys watching soap operas, reading, exercise       Family History  Problem Relation Age of Onset  . Heart attack Mother     [place] age  . Breast cancer Sister     Late 41s; now s/p mastectomy   . Cancer Sister   . Cancer Sister   . Cancer Sister     ROS- All systems are reviewed and negative except as per the HPI above  Physical Exam: Vitals:   04/22/16 1034  BP: (!) 146/90  Weight: 123 lb 3.2 oz (55.9 kg)  Height: 5\' 7"  (1.702 m)    GEN- The patient is well appearing, alert and oriented x 3 today.   Head- normocephalic, atraumatic Eyes-  Sclera clear, conjunctiva pink Ears- hearing intact Oropharynx- clear Neck- supple, no JVP Lymph- no cervical lymphadenopathy Lungs- Clear to ausculation bilaterally, normal work of breathing Heart- Regular rate and rhythm, no murmurs, rubs or gallops, PMI not laterally displaced GI- soft, NT, ND,  + BS Extremities- no clubbing, cyanosis, or edema MS- no significant deformity or atrophy Skin- no rash or lesion Psych- euthymic mood, full affect Neuro- strength and sensation are intact  EKG-NSR at 51 bpm, pr int 140 ms, qrs int 86 ms, qtc 409 ms Epic records reviewed Echo--2016 TEE Left ventricle: The cavity size was normal. Systolic function was  normal. The estimated ejection fraction was in the range of 60%  to 65%. Wall motion was normal; there were no regional wall  motion abnormalities. Doppler parameters are consistent with  abnormal left ventricular relaxation (grade 1 diastolic  dysfunction). - Tricuspid valve: There was mild-moderate regurgitation directed  centrally. 2015- TTE- - Left ventricle: The cavity size was normal. Wall thickness was increased in a pattern of mild LVH. The  estimated ejection fraction was 65%. Wall motion was normal; there were no regional wall motion abnormalities. - Right ventricle: The cavity size was normal. Systolic function was normal. - Pulmonary arteries: PA peak pressure: 31 mm Hg (S).   4/17- Myoview No T wave inversion was noted during stress.  There was no ST segment deviation noted during stress.  There is a medium defect of mild severity present in the basal inferoseptal, mid inferoseptal and apical septal location. The defect is non-reversible. This is most consistent with extracardiac activity on both rest and stress. No ischemia noted.  This is a low risk study.  The left ventricular ejection fraction is normal (55-65%). Nuclear stress EF: 58%  Assessment and Plan: 1. PAF Doing well staying in SR with flecainide 50 mg bid Continue cardizem 120 mg qd Has cardizem 60 mg to use as needed, pill in pocket, for afib episodes  2.  H/A Per ER assessment and plan with pain med  and antihistamine   F/u with me as planned in late September    Benjamine Strout C. [facility] [address] [place], [place] [address] [phone]

## 2016-04-22 NOTE — ED Notes (Signed)
Pt to ER by private vehicle with complaint of headache x2 weeks. Pt reports she believes it's allergy related. Has not attempted to take any medication. Reports hx of same "years ago." a/o x4. NAD. Ambulatory to patient room.

## 2016-04-22 NOTE — Discharge Instructions (Signed)
Head CT without any significant findings. Take the pain medicine as needed for the headache. Take Zyrtec for the sinus and allergy type symptoms. Return for any new or worse symptoms.

## 2016-04-28 ENCOUNTER — Ambulatory Visit: Payer: Self-pay

## 2016-05-04 ENCOUNTER — Ambulatory Visit: Payer: Self-pay

## 2016-05-10 ENCOUNTER — Other Ambulatory Visit: Payer: Self-pay | Admitting: Internal Medicine

## 2016-05-10 DIAGNOSIS — I159 Secondary hypertension, unspecified: Secondary | ICD-10-CM

## 2016-05-10 NOTE — Telephone Encounter (Signed)
tiotropium (SPIRIVA HANDIHALER) 18 MCG inhalation capsule [facility]

## 2016-05-10 NOTE — Telephone Encounter (Signed)
[facility] pharmacy states there are refills on Spiriva ;has to order, be ready tomorrow. This was told to pt by them. Called pt - no answer; left the above message. And to call back for any other concerns.

## 2016-05-14 ENCOUNTER — Encounter: Payer: Self-pay | Admitting: Internal Medicine

## 2016-05-17 ENCOUNTER — Other Ambulatory Visit (HOSPITAL_COMMUNITY)
Admission: RE | Admit: 2016-05-17 | Discharge: 2016-05-17 | Disposition: A | Discharge status: Home / Self Care | Payer: Medicare Other | Source: Ambulatory Visit | Attending: Internal Medicine | Admitting: Internal Medicine

## 2016-05-17 ENCOUNTER — Encounter (HOSPITAL_COMMUNITY): Payer: Self-pay | Admitting: Nurse Practitioner

## 2016-05-17 ENCOUNTER — Ambulatory Visit (HOSPITAL_COMMUNITY)
Admission: RE | Admit: 2016-05-17 | Discharge: 2016-05-17 | Disposition: A | Discharge status: Home / Self Care | Payer: Medicare Other | Source: Ambulatory Visit | Attending: Nurse Practitioner | Admitting: Nurse Practitioner

## 2016-05-17 ENCOUNTER — Ambulatory Visit (INDEPENDENT_AMBULATORY_CARE_PROVIDER_SITE_OTHER): Payer: Medicare Other | Admitting: Internal Medicine

## 2016-05-17 VITALS — BP 130/82 | HR 62 | Ht 67.0 in | Wt 125.6 lb

## 2016-05-17 VITALS — BP 149/83 | HR 62 | Temp 98.0°F | Ht 67.0 in | Wt 125.6 lb

## 2016-05-17 DIAGNOSIS — Z8249 Family history of ischemic heart disease and other diseases of the circulatory system: Secondary | ICD-10-CM | POA: Diagnosis not present

## 2016-05-17 DIAGNOSIS — Z803 Family history of malignant neoplasm of breast: Secondary | ICD-10-CM | POA: Insufficient documentation

## 2016-05-17 DIAGNOSIS — I480 Paroxysmal atrial fibrillation: Secondary | ICD-10-CM | POA: Diagnosis not present

## 2016-05-17 DIAGNOSIS — Z888 Allergy status to other drugs, medicaments and biological substances status: Secondary | ICD-10-CM | POA: Insufficient documentation

## 2016-05-17 DIAGNOSIS — Z809 Family history of malignant neoplasm, unspecified: Secondary | ICD-10-CM | POA: Diagnosis not present

## 2016-05-17 DIAGNOSIS — I10 Essential (primary) hypertension: Secondary | ICD-10-CM | POA: Diagnosis not present

## 2016-05-17 DIAGNOSIS — Z7901 Long term (current) use of anticoagulants: Secondary | ICD-10-CM | POA: Insufficient documentation

## 2016-05-17 DIAGNOSIS — L819 Disorder of pigmentation, unspecified: Secondary | ICD-10-CM

## 2016-05-17 DIAGNOSIS — L308 Other specified dermatitis: Secondary | ICD-10-CM | POA: Diagnosis not present

## 2016-05-17 DIAGNOSIS — L818 Other specified disorders of pigmentation: Secondary | ICD-10-CM

## 2016-05-17 DIAGNOSIS — E785 Hyperlipidemia, unspecified: Secondary | ICD-10-CM

## 2016-05-17 DIAGNOSIS — K219 Gastro-esophageal reflux disease without esophagitis: Secondary | ICD-10-CM | POA: Diagnosis not present

## 2016-05-17 DIAGNOSIS — L309 Dermatitis, unspecified: Secondary | ICD-10-CM

## 2016-05-17 MED ORDER — ATORVASTATIN CALCIUM 40 MG PO TABS: 40.0000 mg | ORAL_TABLET | Freq: Every day | ORAL | 11 refills | Status: DC

## 2016-05-17 NOTE — Assessment & Plan Note (Signed)
Per 2013 AHA/ACC ASCVD risk calculator, 10-year risk is 9.7% and 5.8% with optimal risk factors. Moderate to high intensity statin therapy is thus indicated .  We started her on Lipitor 40 mg daily.

## 2016-05-17 NOTE — Assessment & Plan Note (Signed)
She had this hyperpigmented skin lesion on her right lower leg for  more than 2 month, she has tried triamcinolone cream without any improvement. We talked about getting a biopsy during her last visit. She came today to get a skin biopsy of that hyperpigmented lesion. Skin biopsy was performed. Procedure note, skin biopsy. After informed written consent was obtained, using Betadine for cleansing and 2% Lidocaine with epinephrine for anesthetic, with sterile technique a punch biopsy was used to obtain a biopsy specimen of the lesion. Hemostasis was obtained by pressure and wound was bandaged with sterile gauze and wound care instructions provided. Be alert for any signs of cutaneous infection. The specimen is labeled and sent to pathology for evaluation. The procedure was well tolerated without complications.

## 2016-05-17 NOTE — Patient Instructions (Addendum)
Thank you for visiting the clinic today. We did a skin biopsy for your rash today, please be easy on that leg. You can take a shower but do not soak your leg. If you noticed any bleeding you can change her bandage and apply pressure to that. We will call you back with the results. I'm starting you on a cholesterol-lowering medicine called Lipitor today. You can make another appointment for your Pap smear whenever you wanted .Skin Biopsy WHY AM I HAVING THIS TEST? This test examines skin tissue under a microscope. Your health care provider may perform this test to identify the source of a skin rash or lesion if an allergy is suspected. In this test, a tissue sample (biopsy) is taken to look at your skin's anatomy and to see if certain proteins and antibodies are present. WHAT KIND OF SAMPLE IS TAKEN? A small sample of skin is required for this test. It is usually collected by numbing an area of skin and removing a small circle of skin. HOW DO I PREPARE FOR THE TEST? There is no preparation required for this test. HOW ARE THE TEST RESULTS REPORTED? Your results may be reported in different ways and are dependent upon the kind of test that is performed. It is your responsibility to obtain your test results. Ask the lab or department performing the test when and how you will get your results. It is most common for your results to be reported as:  Normal skin anatomy (histology).  No evidence of IgG, IgA, or IgM antibody, complement C3, or fibrinogen. WHAT DO THE RESULTS MEAN? Abnormal findings may indicate certain autoimmune diseases. This test can produce many different results. It is important to talk with your health care provider to discuss your results, treatment options, and if necessary, the need for more tests. Talk with your health care provider if you have any questions about your results.   This information is not intended to replace advice given to you by your health care provider.  Make sure you discuss any questions you have with your health care provider.   Document Released: 09/10/2004 Document Revised: 08/30/2014 Document Reviewed: 11/06/2014 Elsevier Interactive Patient Education [employer].

## 2016-05-17 NOTE — Progress Notes (Signed)
   CC: Follow up of her skin lesion and get a skin biopsy.  HPI:  [name] is a 65 y.o. with past medical history as listed below came to the clinic to get a skin punch biopsy because of her nonhealing hypopigmented skin lesion for more than 2 month. She states that she is still having a lot of itching in that area and he tend to bleed with scratching. She has no other complaints today. She is also due for Pap smear, which she wants to make another appointment for that purpose.  Past Medical History:  Diagnosis Date  . A-fib ([place])   . Asthma   . Chronic anticoagulation   . GERD (gastroesophageal reflux disease)   . Hypertension     Review of Systems:  As per HPI.  Physical Exam:  Vitals:   05/17/16 1527  BP: (!) 149/83  Pulse: 62  Temp: 98 F (36.7 C)  TempSrc: Oral  SpO2: 100%  Weight: 125 lb 9.6 oz (57 kg)  Height: 5\' 7"  (1.702 m)   General: Vital signs reviewed.  Patient is well-developed and well-nourished, in no acute distress and cooperative with exam.  Cardiovascular: RRR, S1 normal, S2 normal, no murmurs, gallops, or rubs. Pulmonary/Chest: Clear to auscultation bilaterally, no wheezes, rales, or rhonchi. Skin: Warm, dry and intact. Has a hyperpigmented rash at her lower right leg.No sign of infection,or ozzing. Assessment & Plan:   See Encounters Tab for problem based charting.  Patient seen with Dr. Lynnae January.

## 2016-05-17 NOTE — Progress Notes (Signed)
Patient ID: [name], female   DOB: Feb 28, 1951, 65 y.o.   MRN: [id number]    Primary Care Physician: [name], MD Referring Physician: [facility] ER f/u   [name] is a 65 y.o. female with a h/o PAF, diagnosed 11/16, that is in the afib clinic for multiple visits to the ER for epioses of afib as follows,4/3; A. fib, converted, 4/5: A. fib, admitted on Cardizem drip, discharged on beta blocker, plus Cardizem,4/11: Symptomatic with palpitations and lightheadedness. Converted from A fib with BB,discontinued due to dizziness and discharged,5/23: Atrial fibrillation6/1: A. fib, converted,6/21: A. fib, converted with Cardizem drip, discharged.6/22 Returned to ER, flecainide started.  In the clinic today, she is SR after starting flecainide 50 mg bid. She has stopped daily Cardizem from confusion in instructions but was told to restart this with flecainide and the role of the prn Cardizem drug was discussed. No alcohol, smoking, excessive caffeine, no illicit drugs. She walks a lot and is not overweight. No snoring.She is on eliquis from last fall for a chadsvsasc score of 3.  She returns to the afib clinic 7/26 and reports having some skips in her heart that she took  her prn cardizem, which resolved her symptoms. She says she is aware of her heart when she gets upset but no sustained episodes of afib.Compliant with her flecainide and xarelto. No further visits to the ER. Establishing with Internal Medicine clinic at [facility] today.  F/u 8/31, she was seen in the ER before this appointment for H/A. Started late last week, saw PCP first. When it did not go away, she decided to go to the ER. CT of head negative. Thought possibly secondary to allergies. I doubt flecainide/cardizem since she has been on since June and tolerated well. She has not had any significant afib. Has maybe had to take 1-2 short acting cardizem's for irregular heart beat since seen last.   Seen f/u in  afib clinic and is doing well. No further headaches. They did resolve on their own. No further afib. Today no issues. Feels good.Complinat with med's.  Today, she denies symptoms of palpitations, chest pain, shortness of breath, orthopnea, PND, lower extremity edema, dizziness, presyncope, syncope, or neurologic sequela. The patient is tolerating medications without difficulties and is otherwise without complaint today.   Past Medical History:  Diagnosis Date  . A-fib ([place])   . Asthma   . Chronic anticoagulation   . GERD (gastroesophageal reflux disease)   . Hypertension    Past Surgical History:  Procedure Laterality Date  . TONSILLECTOMY    . TUBAL LIGATION      Current Outpatient Prescriptions  Medication Sig Dispense Refill  . albuterol (PROVENTIL HFA;VENTOLIN HFA) 108 (90 Base) MCG/ACT inhaler Inhale 2 puffs into the lungs every 6 (six) hours as needed for wheezing or shortness of breath.    Marland Kitchen albuterol (PROVENTIL) (2.5 MG/3ML) 0.083% nebulizer solution Take 3 mLs (2.5 mg total) by nebulization every 6 (six) hours as needed for wheezing or shortness of breath. 75 mL 12  . Cetirizine HCl 10 MG CAPS Take 1 capsule (10 mg total) by mouth every morning. 14 capsule 1  . diltiazem (CARDIZEM CD) 120 MG 24 hr capsule Take 1 capsule (120 mg total) by mouth daily. 30 capsule 6  . diltiazem (CARDIZEM) 60 MG tablet 1 tablet by mouth every 6 hours AS NEEDED for sensation of rapid heartbeat. 45 tablet 2  . flecainide (TAMBOCOR) 50 MG tablet Take 1 tablet (50 mg  total) by mouth 2 (two) times daily. 60 tablet 6  . hydrochlorothiazide (HYDRODIURIL) 25 MG tablet Take 1 tablet (25 mg total) by mouth daily. 30 tablet 1  . HYDROcodone-acetaminophen (NORCO/VICODIN) 5-325 MG tablet Take 1-2 tablets by mouth every 6 (six) hours as needed for moderate pain. 10 tablet 0  . potassium chloride SA (K-DUR,KLOR-CON) 20 MEQ tablet Take 20 mEq by mouth 2 (two) times daily.  2  . rivaroxaban (XARELTO) 20 MG TABS  tablet Take 1 tablet (20 mg total) by mouth daily with supper. 30 tablet 0  . Skin Protectants, Misc. (EUCERIN) cream Apply topically as needed for wound care. 397 g 0  . tiotropium (SPIRIVA HANDIHALER) 18 MCG inhalation capsule Place 1 capsule (18 mcg total) into inhaler and inhale daily. 30 capsule 12   Current Facility-Administered Medications  Medication Dose Route Frequency Provider Last Rate Last Dose  . triamcinolone (KENALOG) 0.025 % cream   Topical BID PRN Riccardo Dubin, MD        Allergies  Allergen Reactions  . Protonix [Pantoprazole Sodium]     Heart races  . Celecoxib Rash    Social History   Social History  . Marital status: Single    Spouse name: N/A  . Number of children: N/A  . Years of education: N/A   Occupational History  . Not on file.   Social History Main Topics  . Smoking status: Never Smoker  . Smokeless tobacco: Never Used  . Alcohol use No  . Drug use:     Types: Marijuana     Comment: Smoked marijuana in the past.  . Sexual activity: Yes    Partners: Male    Birth control/ protection: Post-menopausal   Other Topics Concern  . Not on file   Social History Narrative   Lives alone in [place], [place] and has 2 daughters who are within driving distance   Enjoys watching soap operas, reading, exercise       Family History  Problem Relation Age of Onset  . Heart attack Mother     [name] age  . Breast cancer Sister     Late 70s; now s/p mastectomy   . Cancer Sister   . Cancer Sister   . Cancer Sister     ROS- All systems are reviewed and negative except as per the HPI above  Physical Exam: Vitals:   05/17/16 1401  BP: 130/82  Pulse: 62  Weight: 125 lb 9.6 oz (57 kg)  Height: 5\' 7"  (1.702 m)    GEN- The patient is well appearing, alert and oriented x 3 today.   Head- normocephalic, atraumatic Eyes-  Sclera clear, conjunctiva pink Ears- hearing intact Oropharynx- clear Neck- supple, no JVP Lymph- no cervical  lymphadenopathy Lungs- Clear to ausculation bilaterally, normal work of breathing Heart- Regular rate and rhythm, no murmurs, rubs or gallops, PMI not laterally displaced GI- soft, NT, ND, + BS Extremities- no clubbing, cyanosis, or edema MS- no significant deformity or atrophy Skin- no rash or lesion Psych- euthymic mood, full affect Neuro- strength and sensation are intact  EKG-NSR at 62 bpm, pr int 120 ms, qrs int 86 ms, qtc 420 ms Epic records reviewed Echo--2016 TEE Left ventricle: The cavity size was normal. Systolic function was  normal. The estimated ejection fraction was in the range of 60%  to 65%. Wall motion was normal; there were no regional wall  motion abnormalities. Doppler parameters are consistent with  abnormal left ventricular relaxation (grade 1 diastolic  dysfunction). - Tricuspid valve: There was mild-moderate regurgitation directed  centrally. 2015- TTE- - Left ventricle: The cavity size was normal. Wall thickness was increased in a pattern of mild LVH. The estimated ejection fraction was 65%. Wall motion was normal; there were no regional wall motion abnormalities. - Right ventricle: The cavity size was normal. Systolic function was normal. - Pulmonary arteries: PA peak pressure: 31 mm Hg (S).   4/17- Myoview No T wave inversion was noted during stress.  There was no ST segment deviation noted during stress.  There is a medium defect of mild severity present in the basal inferoseptal, mid inferoseptal and apical septal location. The defect is non-reversible. This is most consistent with extracardiac activity on both rest and stress. No ischemia noted.  This is a low risk study.  The left ventricular ejection fraction is normal (55-65%). Nuclear stress EF: 58%  Assessment and Plan: 1. PAF Doing well staying in SR with flecainide 50 mg bid Continue cardizem 120 mg qd Has cardizem 60 mg to use as needed, pill in pocket, for afib  episodes Continue xarelto 20 mg a day with chadsvasc score of 3.  2.  H/A Resolved    F/u with  In 3 months    [name], [facility] [address] [place], [place] [address] [phone]

## 2016-05-17 NOTE — Assessment & Plan Note (Signed)
BP Readings from Last 3 Encounters:  05/17/16 (!) 149/83  05/17/16 130/82  04/22/16 (!) 146/90   Her blood pressure was slightly high, may be due to anxiety. She came today for skin biopsy. Apparently she was seen at cardiology for her A. Fib. She was normotensive during that visit. She said she is being compliant with her medicines.  Continue with the current management.

## 2016-05-18 ENCOUNTER — Ambulatory Visit (HOSPITAL_COMMUNITY): Payer: Self-pay | Admitting: Nurse Practitioner

## 2016-05-19 ENCOUNTER — Other Ambulatory Visit: Payer: Self-pay | Admitting: Internal Medicine

## 2016-05-19 MED ORDER — TRIAMCINOLONE ACETONIDE 0.025 % EX OINT: TOPICAL_OINTMENT | Freq: Two times a day (BID) | CUTANEOUS | 0 refills | Status: DC

## 2016-05-19 NOTE — Progress Notes (Signed)
Internal Medicine Clinic Attending  I saw and evaluated the patient.  I personally confirmed the key portions of the history and exam documented by Dr. Reesa Chew and I reviewed pertinent patient test results.  The assessment, diagnosis, and plan were formulated together and I agree with the documentation in the resident's note. I was present and supervised the punch biopsy which occurred without complication. The patient also complained of calluses on the plantar surfaces of bilateral feet. She is using a razor to shave them and they remain painful. She is agreeable to see a podiatrist.

## 2016-05-19 NOTE — Telephone Encounter (Signed)
Pt requesting a refill on   triamcinolone (KENALOG) 0.025 % cream

## 2016-05-28 ENCOUNTER — Ambulatory Visit: Payer: Self-pay

## 2016-06-01 ENCOUNTER — Ambulatory Visit: Payer: Self-pay

## 2016-06-15 ENCOUNTER — Other Ambulatory Visit: Payer: Self-pay

## 2016-06-15 ENCOUNTER — Telehealth: Payer: Self-pay | Admitting: *Deleted

## 2016-06-15 NOTE — Telephone Encounter (Signed)
rivaroxaban (XARELTO) 20 MG TABS tablet, refill request.

## 2016-06-15 NOTE — Telephone Encounter (Signed)
Call from pt stating our office was going to address the "corns" on her feet.  Pt seen on 09/25 in [place] that visit she states she was told that someone would call her with an appt to come in and remove the corns.  I am not sure if pt meant a referral to an outside office or here in our office.  Will forward info to pcp for review.  Please advise.Shelby Hidden Cassady10/24/201710:59 AM

## 2016-06-25 ENCOUNTER — Other Ambulatory Visit: Payer: Self-pay

## 2016-06-25 NOTE — Telephone Encounter (Signed)
hydrochlorothiazide (HYDRODIURIL) 25 MG tablet, refill request @ [facility].

## 2016-06-26 MED ORDER — HYDROCHLOROTHIAZIDE 25 MG PO TABS: 25.0000 mg | ORAL_TABLET | Freq: Every day | ORAL | 0 refills | Status: DC

## 2016-06-29 ENCOUNTER — Other Ambulatory Visit: Payer: Self-pay

## 2016-06-29 DIAGNOSIS — Z748 Other problems related to care provider dependency: Secondary | ICD-10-CM

## 2016-06-29 NOTE — Telephone Encounter (Signed)
rivaroxaban (XARELTO) 20 MG TABS, refill request @ [facility] on [address]

## 2016-06-30 ENCOUNTER — Telehealth: Payer: Self-pay

## 2016-06-30 NOTE — Telephone Encounter (Signed)
Patient calling back again in reference to a referral for her "corns on her feet".  Please Advise.

## 2016-06-30 NOTE — Telephone Encounter (Signed)
We can give her referral to podiatrist, if she is interested.

## 2016-07-01 ENCOUNTER — Other Ambulatory Visit (HOSPITAL_COMMUNITY): Payer: Self-pay | Admitting: *Deleted

## 2016-07-01 MED ORDER — RIVAROXABAN 20 MG PO TABS: 20.0000 mg | ORAL_TABLET | Freq: Every day | ORAL | 5 refills | Status: DC

## 2016-07-01 MED ORDER — TIOTROPIUM BROMIDE MONOHYDRATE 18 MCG IN CAPS: 18.0000 ug | ORAL_CAPSULE | Freq: Every day | RESPIRATORY_TRACT | 12 refills | Status: DC

## 2016-07-01 MED ORDER — RIVAROXABAN 20 MG PO TABS: 20.0000 mg | ORAL_TABLET | Freq: Every day | ORAL | 6 refills | Status: DC

## 2016-07-01 NOTE — Telephone Encounter (Signed)
Spoke with patient and [facility], they will refill xarelto, but pt still needs refill of spiriva.Regenia Skeeter, Suhaas Agena Cassady11/9/20172:34 PM

## 2016-07-02 ENCOUNTER — Other Ambulatory Visit: Payer: Self-pay | Admitting: Internal Medicine

## 2016-07-02 DIAGNOSIS — Z748 Other problems related to care provider dependency: Secondary | ICD-10-CM

## 2016-07-05 ENCOUNTER — Telehealth: Payer: Self-pay | Admitting: Internal Medicine

## 2016-07-05 NOTE — Telephone Encounter (Signed)
Would like to talk to nurse °

## 2016-07-06 NOTE — Telephone Encounter (Signed)
[name] is a 65 y.o. female who was contacted via telephone for monitoring of rivaroxaban (Xarelto) therapy.    ASSESSMENT Indication(s): atrial fibrillation Duration: indefinite  Labs:    Component Value Date/Time   AST 24 11/24/2015 1737   ALT 20 11/24/2015 1737   NA 138 02/12/2016 1100   K 4.6 02/12/2016 1100   CL 106 02/12/2016 1100   CO2 22 02/12/2016 1100   GLUCOSE 96 02/12/2016 1100   HGBA1C 5.3 03/17/2016 1030   BUN 15 02/12/2016 1100   CREATININE 0.86 02/12/2016 1100   CALCIUM 10.3 02/12/2016 1100   GFRNONAA >60 02/12/2016 1100   GFRAA >60 02/12/2016 1100   WBC 5.2 02/12/2016 1100   HGB 13.5 02/12/2016 1100   HCT 41.2 02/12/2016 1100   PLT 215 02/12/2016 1100    rivaroxaban (Xarelto) Dose: 20 mg daily   Safety: Patient has not had recent bleeding/thromboembolic events. Patient reports no recent signs or symptoms of bleeding, no signs of symptoms of thromboembolism. Medication changes: no.  Renal/hepatic/drug interaction concerns: no.  Adherence: Patient does correctly recite the dose. Patient reports adherence challenges.Patient reports that she missed approximately one week of doses because she transferred her medications to [facility] and could not get transportation to pick up medication. She reports that she is now "back on track" and will remain mindful not to miss any further doses.   Patient Instructions: Patient advised to contact clinic or seek medical attention if signs/symptoms of bleeding or thromboembolism occur. Patient verbalized understanding by repeating back information.  Follow-up Recommended labs to consider: CBC . Next appointment 08/18/16 with Roderic Palau, NP Cardiology   Darcella Cheshire PharmD Candidate  07/06/2016, 10:40 AM

## 2016-07-08 ENCOUNTER — Other Ambulatory Visit: Payer: Self-pay | Admitting: Internal Medicine

## 2016-07-08 DIAGNOSIS — L84 Corns and callosities: Secondary | ICD-10-CM

## 2016-07-12 NOTE — Telephone Encounter (Signed)
Patient was contacted by Frank Tillman, PharmD candidate. I agree with the assessment and plan of care documented.  

## 2016-07-13 NOTE — Telephone Encounter (Signed)
Have called several times, spoke w/ pt and she states she did not call and does not need anything

## 2016-07-22 ENCOUNTER — Other Ambulatory Visit: Payer: Self-pay

## 2016-07-22 NOTE — Telephone Encounter (Signed)
Requesting pain med to be filled.  

## 2016-07-27 ENCOUNTER — Encounter (HOSPITAL_COMMUNITY): Payer: Self-pay | Admitting: Emergency Medicine

## 2016-07-27 ENCOUNTER — Ambulatory Visit: Payer: Medicare Other | Admitting: Podiatry

## 2016-07-27 ENCOUNTER — Emergency Department (HOSPITAL_COMMUNITY)
Admission: EM | Admit: 2016-07-27 | Discharge: 2016-07-27 | Disposition: A | Discharge status: Home / Self Care | Payer: Medicare HMO | Attending: Emergency Medicine | Admitting: Emergency Medicine

## 2016-07-27 ENCOUNTER — Emergency Department (HOSPITAL_COMMUNITY): Payer: Medicare HMO

## 2016-07-27 DIAGNOSIS — Y999 Unspecified external cause status: Secondary | ICD-10-CM | POA: Diagnosis not present

## 2016-07-27 DIAGNOSIS — J45909 Unspecified asthma, uncomplicated: Secondary | ICD-10-CM | POA: Diagnosis not present

## 2016-07-27 DIAGNOSIS — Z7901 Long term (current) use of anticoagulants: Secondary | ICD-10-CM | POA: Insufficient documentation

## 2016-07-27 DIAGNOSIS — H5712 Ocular pain, left eye: Secondary | ICD-10-CM | POA: Diagnosis not present

## 2016-07-27 DIAGNOSIS — S0592XA Unspecified injury of left eye and orbit, initial encounter: Secondary | ICD-10-CM | POA: Diagnosis not present

## 2016-07-27 DIAGNOSIS — Y939 Activity, unspecified: Secondary | ICD-10-CM | POA: Insufficient documentation

## 2016-07-27 DIAGNOSIS — S0502XA Injury of conjunctiva and corneal abrasion without foreign body, left eye, initial encounter: Secondary | ICD-10-CM | POA: Insufficient documentation

## 2016-07-27 DIAGNOSIS — W2209XA Striking against other stationary object, initial encounter: Secondary | ICD-10-CM | POA: Insufficient documentation

## 2016-07-27 DIAGNOSIS — Y929 Unspecified place or not applicable: Secondary | ICD-10-CM | POA: Insufficient documentation

## 2016-07-27 DIAGNOSIS — I10 Essential (primary) hypertension: Secondary | ICD-10-CM | POA: Insufficient documentation

## 2016-07-27 DIAGNOSIS — R220 Localized swelling, mass and lump, head: Secondary | ICD-10-CM | POA: Diagnosis not present

## 2016-07-27 MED ORDER — ACETAMINOPHEN 325 MG PO TABS
650.0000 mg | ORAL_TABLET | Freq: Once | ORAL | Status: AC
  Administered 2016-07-27: 650 mg via ORAL
  Filled 2016-07-27: qty 2

## 2016-07-27 MED ORDER — NAPROXEN 500 MG PO TABS: 500.0000 mg | ORAL_TABLET | Freq: Two times a day (BID) | ORAL | 0 refills | Status: DC
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
## 2016-07-27 NOTE — ED Provider Notes (Signed)
[place] DEPT Provider Note    By signing my name below, I, [name], attest that this documentation has been prepared under the direction and in the presence of [name], PA-C. Electronically Signed: [name], ED Scribe. 07/27/16. 9:45 AM.    History   Chief Complaint Chief Complaint  Patient presents with  . Eye Injury     The history is provided by the patient and medical records. No language interpreter was used.    HPI Comments:  [name] is a 65 y.o. female who presents to the Emergency Department complaining of left sided facial pain just inferior to the left eye that began three days ago secondary to opening the car door and striking her face with the corner of the door. No LOC.  She reports an associated swelling below the left eye but denies pain in the eye itself.  She denies blurred vision, headache, confusion, dizziness, numbness, or weakness.  Patient does take xarelto for AFIB.    Past Medical History:  Diagnosis Date  . A-fib ([place])   . Asthma   . Chronic anticoagulation   . GERD (gastroesophageal reflux disease)   . Hypertension     Patient Active Problem List   Diagnosis Date Noted  . Headache 04/16/2016  . Hyperlipidemia 03/18/2016  . Hyperglycemia 03/17/2016  . Breast cancer screening, high risk patient 03/17/2016  . Eczema 03/17/2016  . Cervical cancer screening 03/17/2016  . Assistance with transportation 03/17/2016  . Acute stress reaction 03/17/2016  . Hypertension   . GERD (gastroesophageal reflux disease)   . PAF (paroxysmal atrial fibrillation) ([place]) 11/26/2015  . Chronic anticoagulation 11/26/2015  . Severe malnutrition ([place]) 06/12/2015  . Folliculitis [id number]  . Marijuana abuse 06/11/2015  . Asthma, mild intermittent   . WEIGHT LOSS, ABNORMAL 04/11/2007    Past Surgical History:  Procedure Laterality Date  . TONSILLECTOMY    . TUBAL LIGATION      OB History    No data available       Home  Medications    Prior to Admission medications   Medication Sig Start Date End Date Taking? Authorizing Provider  albuterol (PROVENTIL HFA;VENTOLIN HFA) 108 (90 Base) MCG/ACT inhaler Inhale 2 puffs into the lungs every 6 (six) hours as needed for wheezing or shortness of breath.    Historical Provider, MD  albuterol (PROVENTIL) (2.5 MG/3ML) 0.083% nebulizer solution Take 3 mLs (2.5 mg total) by nebulization every 6 (six) hours as needed for wheezing or shortness of breath. 07/07/14   Quintella Reichert, MD  atorvastatin (LIPITOR) 40 MG tablet Take 1 tablet (40 mg total) by mouth daily. 05/17/16 05/17/17  [name], MD  Cetirizine HCl 10 MG CAPS Take 1 capsule (10 mg total) by mouth every morning. 04/22/16   Fredia Sorrow, MD  diltiazem (CARDIZEM CD) 120 MG 24 hr capsule Take 1 capsule (120 mg total) by mouth daily. 02/17/16   Sherran Needs, NP  diltiazem (CARDIZEM) 60 MG tablet 1 tablet by mouth every 6 hours AS NEEDED for sensation of rapid heartbeat. 04/01/16   Sherran Needs, NP  flecainide (TAMBOCOR) 50 MG tablet Take 1 tablet (50 mg total) by mouth 2 (two) times daily. 04/01/16   Sherran Needs, NP  hydrochlorothiazide (HYDRODIURIL) 25 MG tablet Take 1 tablet (25 mg total) by mouth daily. 06/26/16   [name], MD  HYDROcodone-acetaminophen (NORCO/VICODIN) 5-325 MG tablet Take 1-2 tablets by mouth every 6 (six) hours as needed for moderate pain. 04/22/16  Fredia Sorrow, MD  potassium chloride SA (K-DUR,KLOR-CON) 20 MEQ tablet Take 20 mEq by mouth 2 (two) times daily. 11/12/15   Historical Provider, MD  rivaroxaban (XARELTO) 20 MG TABS tablet Take 1 tablet (20 mg total) by mouth daily with supper. 07/01/16   [name], MD  rivaroxaban (XARELTO) 20 MG TABS tablet Take 1 tablet (20 mg total) by mouth daily with supper. 07/01/16   Sherran Needs, NP  Skin Protectants, Misc. (EUCERIN) cream Apply topically as needed for wound care. 01/22/16   Carmin Muskrat, MD  SPIRIVA HANDIHALER 18 MCG inhalation  capsule INHALE THE CONTENTS OF 1 CAPSULE(18 MCG) VIA INHALATION DEVICE EVERY DAY 07/02/16   [name], MD  triamcinolone (KENALOG) 0.025 % ointment Apply topically 2 (two) times daily. As needed for rash. Apply to lower leg for the next 4 weeks. 05/19/16   [name], MD    Family History Family History  Problem Relation Age of Onset  . Heart attack Mother     [name] age  . Breast cancer Sister     Late 76s; now s/p mastectomy   . Cancer Sister   . Cancer Sister   . Cancer Sister     Social History Social History  Substance Use Topics  . Smoking status: Never Smoker  . Smokeless tobacco: Never Used  . Alcohol use No     Allergies   Protonix [pantoprazole sodium] and Celecoxib   Review of Systems Review of Systems  HENT: Positive for facial swelling.   Eyes: Negative for redness and visual disturbance.  Gastrointestinal: Negative for nausea and vomiting.  Neurological: Negative for syncope.  All other systems reviewed and are negative.    Physical Exam Updated Vital Signs BP 149/78 (BP Location: Right Arm)   Pulse 69   Temp 98.2 F (36.8 C) (Oral)   Resp 20   SpO2 99%   Physical Exam  Constitutional: She is oriented to person, place, and time. She appears well-developed and well-nourished.  HENT:  Head: Normocephalic.  Eyes: Conjunctivae and EOM are normal. Pupils are equal, round, and reactive to light.  Small abrasion beneath left eye with swelling and tenderness surrounding. No gross bony deformity. No conjunctival hemorrhage or injection. EOMs fully intact and nonpainful bilaterally. visual acuity 20/20 bilaterally  Neck: Normal range of motion.  Cardiovascular: Normal rate.   Pulmonary/Chest: Effort normal.  Abdominal: Soft.  Musculoskeletal: Normal range of motion.  Neurological: She is alert and oriented to person, place, and time.  Skin: Skin is warm and dry.  Psychiatric: She has a normal mood and affect.  Nursing note and vitals  reviewed.    ED Treatments / Results  DIAGNOSTIC STUDIES: Oxygen Saturation is 99% on RA, normal by my interpretation.   COORDINATION OF CARE: 9:42 AM- Will order imaging. Pt verbalizes understanding and agrees to plan.  Medications - No data to display  Labs (all labs ordered are listed, but only abnormal results are displayed) Labs Reviewed - No data to display  EKG  EKG Interpretation None       Radiology Dg Orbits  Result Date: 07/27/2016 CLINICAL DATA:  Left eye pain and swelling, hit with  car door EXAM: ORBITS - COMPLETE 4+ VIEW COMPARISON:  None. FINDINGS: There is no evidence of fracture or other significant bone abnormality. No orbital emphysema or sinus air-fluid levels are seen. IMPRESSION: Negative. Electronically Signed   By: Lahoma Crocker M.D.   On: 07/27/2016 10:35    Procedures Procedures (including critical care  time)  Medications Ordered in ED Medications - No data to display   Initial Impression / Assessment and Plan / ED Course  I have reviewed the triage vital signs and the nursing notes.  Pertinent labs & imaging results that were available during my care of the patient were reviewed by me and considered in my medical decision making (see chart for details).  Clinical Course    65 year old female here with pain beneath the left eye after accidentally opening the car door and striking her face with a few days ago. No loss of consciousness. She does have a small abrasion beneath the left eye with mild amount of swelling and tenderness. There is no gross bony deformity of the orbital ram. No conjunctival hemorrhage or injection. Her EOMs are fully intact and nonpainful. Patient is 20/20 in both eyes. X-ray obtained of the orbits, no evidence of fracture. Will discharge home with supportive care.  Recommended to follow-up with PCP.  Discussed plan with patient, she acknowledged understanding and agreed with plan of care.  Return precautions given for new or  worsening symptoms.  I personally performed the services described in this documentation, which was scribed in my presence. The recorded information has been reviewed and is accurate.  Final Clinical Impressions(s) / ED Diagnoses   Final diagnoses:  Left eye injury, initial encounter    New Prescriptions Discharge Medication List as of 07/27/2016 10:44 AM       Larene Pickett, PA-C 07/27/16 1116    Leo Grosser, MD 07/27/16 [phone]

## 2016-07-27 NOTE — ED Notes (Signed)
Patient returned from xray.

## 2016-07-27 NOTE — Discharge Instructions (Signed)
Your x-ray was normal, no evidence of fracture. Recommend to take tylenol or motrin as needed for pain. Follow-up with your primary care doctor if you continue having an issues. Return here for new concerns.

## 2016-07-27 NOTE — ED Notes (Signed)
Returned from xray

## 2016-07-27 NOTE — ED Notes (Signed)
Patient transported to X-ray 

## 2016-07-28 ENCOUNTER — Ambulatory Visit: Payer: Medicare Other | Admitting: Podiatry

## 2016-07-28 NOTE — Telephone Encounter (Signed)
Never answer or no vmail

## 2016-07-28 NOTE — Telephone Encounter (Signed)
Have tried to call pt to make an appt, she will need to be seen for pain med

## 2016-07-29 ENCOUNTER — Telehealth: Payer: Self-pay

## 2016-07-29 NOTE — Telephone Encounter (Signed)
Please call pt back regarding blood thinner.

## 2016-07-29 NOTE — Telephone Encounter (Signed)
Returned pt's call - no one answer; left message to give us a call back. 

## 2016-07-30 NOTE — Telephone Encounter (Signed)
I called the patient and told her that she can go ahead with her foot surgery, just do not takes Xarelto on the day of surgery. On the phone she was asking for a prescription for hydrocodone because of her high pain. I explained to her that we cannot prescribe that on phone. She can try taking some Tylenol for pain. She should avoid taking any ibuprofen before surgery. Patient understood that.
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
## 2016-08-02 ENCOUNTER — Encounter: Payer: Self-pay | Admitting: Podiatry

## 2016-08-02 ENCOUNTER — Ambulatory Visit (INDEPENDENT_AMBULATORY_CARE_PROVIDER_SITE_OTHER): Payer: Commercial Managed Care - HMO | Admitting: Podiatry

## 2016-08-02 DIAGNOSIS — M79672 Pain in left foot: Secondary | ICD-10-CM

## 2016-08-02 DIAGNOSIS — M79671 Pain in right foot: Secondary | ICD-10-CM | POA: Diagnosis not present

## 2016-08-02 DIAGNOSIS — L84 Corns and callosities: Secondary | ICD-10-CM

## 2016-08-02 DIAGNOSIS — L851 Acquired keratosis [keratoderma] palmaris et plantaris: Secondary | ICD-10-CM | POA: Diagnosis not present

## 2016-08-08 NOTE — Progress Notes (Signed)
Subjective: Patient presents to the office today for chief complaint of painful callus lesions of the feet. Patient states that the pain is ongoing and is affecting their ability to ambulate without pain. Patient presents today for further treatment and evaluation.  Objective:  Physical Exam General: Alert and oriented x3 in no acute distress  Dermatology: Hyperkeratotic lesion present on the weightbearing surfaces of the bilateral fifth MPJs. Pain on palpation with a central nucleated core noted.  Skin is warm, dry and supple bilateral lower extremities. Negative for open lesions or macerations.  Vascular: Palpable pedal pulses bilaterally. No edema or erythema noted. Capillary refill within normal limits.  Neurological: Epicritic and protective threshold grossly intact bilaterally.   Musculoskeletal Exam: Pain on palpation at the keratotic lesion noted. Range of motion within normal limits bilateral. Muscle strength 5/5 in all groups bilateral.  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  #3 Treated area(s) with Salinocaine and dressed with light dressing. #4 Patient is to return to the clinic PRN.   Edrick Kins, [place]

## 2016-08-17 ENCOUNTER — Telehealth: Payer: Self-pay

## 2016-08-17 NOTE — Telephone Encounter (Signed)
LVM for pt to return call

## 2016-08-18 ENCOUNTER — Encounter (HOSPITAL_COMMUNITY): Payer: Self-pay | Admitting: *Deleted

## 2016-08-18 ENCOUNTER — Inpatient Hospital Stay (HOSPITAL_COMMUNITY): Admission: RE | Admit: 2016-08-18 | Payer: Self-pay | Source: Ambulatory Visit | Admitting: Nurse Practitioner

## 2016-08-20 ENCOUNTER — Telehealth: Payer: Self-pay | Admitting: *Deleted

## 2016-08-20 NOTE — Telephone Encounter (Signed)
Pt left name, DOB and phone number. Left message requesting pt leave an question with the message and her name and I would in most cases be able to get back with her answer, and not have to leave her a message.

## 2016-08-26 ENCOUNTER — Other Ambulatory Visit: Payer: Self-pay

## 2016-08-26 DIAGNOSIS — Z748 Other problems related to care provider dependency: Secondary | ICD-10-CM

## 2016-08-26 NOTE — Telephone Encounter (Signed)
Cetirizine HCl 10 MG CAPS, SPIRIVA HANDIHALER 18 MCG inhalation capsule, refill reeuest @ [facility] on [address].

## 2016-08-28 ENCOUNTER — Other Ambulatory Visit: Payer: Self-pay | Admitting: Internal Medicine

## 2016-08-28 DIAGNOSIS — Z748 Other problems related to care provider dependency: Secondary | ICD-10-CM

## 2016-08-30 ENCOUNTER — Telehealth: Payer: Self-pay

## 2016-08-30 NOTE — Telephone Encounter (Signed)
Pt needs to speak with a nurse regarding meds.

## 2016-08-31 ENCOUNTER — Other Ambulatory Visit: Payer: Self-pay | Admitting: Internal Medicine

## 2016-08-31 DIAGNOSIS — Z748 Other problems related to care provider dependency: Secondary | ICD-10-CM

## 2016-09-01 ENCOUNTER — Other Ambulatory Visit: Payer: Self-pay | Admitting: *Deleted

## 2016-09-01 MED ORDER — CETIRIZINE HCL 10 MG PO CAPS: 1.0000 | ORAL_CAPSULE | Freq: Every morning | ORAL | 11 refills | Status: DC

## 2016-09-01 MED ORDER — TIOTROPIUM BROMIDE MONOHYDRATE 18 MCG IN CAPS: ORAL_CAPSULE | RESPIRATORY_TRACT | 5 refills | Status: DC

## 2016-09-01 NOTE — Telephone Encounter (Signed)
Called to update med refill but no answer, left msg to call back.

## 2016-09-01 NOTE — Telephone Encounter (Signed)
Called in cetirizine. Informed patient it will take up to an hour to refill it per pharmacist.

## 2016-09-01 NOTE — Telephone Encounter (Signed)
Patient requesting med refills. Stated she has run out & needs them asap. Appt w/ pcp 09/24/16. Thanks!

## 2016-09-07 ENCOUNTER — Ambulatory Visit (HOSPITAL_COMMUNITY): Payer: Self-pay | Admitting: Nurse Practitioner

## 2016-09-10 ENCOUNTER — Ambulatory Visit (INDEPENDENT_AMBULATORY_CARE_PROVIDER_SITE_OTHER): Payer: Medicare Other | Admitting: Internal Medicine

## 2016-09-10 ENCOUNTER — Emergency Department (HOSPITAL_COMMUNITY)
Admission: EM | Admit: 2016-09-10 | Discharge: 2016-09-10 | Disposition: A | Discharge status: Home / Self Care | Payer: Medicare Other | Attending: Dermatology | Admitting: Dermatology

## 2016-09-10 ENCOUNTER — Encounter: Payer: Self-pay | Admitting: Internal Medicine

## 2016-09-10 ENCOUNTER — Encounter (HOSPITAL_COMMUNITY): Payer: Self-pay | Admitting: *Deleted

## 2016-09-10 ENCOUNTER — Encounter (HOSPITAL_COMMUNITY): Payer: Self-pay | Admitting: Nurse Practitioner

## 2016-09-10 ENCOUNTER — Ambulatory Visit (HOSPITAL_COMMUNITY)
Admission: RE | Admit: 2016-09-10 | Discharge: 2016-09-10 | Disposition: A | Discharge status: Home / Self Care | Payer: Medicare Other | Source: Ambulatory Visit | Attending: Nurse Practitioner | Admitting: Nurse Practitioner

## 2016-09-10 VITALS — BP 156/81 | HR 55 | Temp 98.2°F | Wt 130.4 lb

## 2016-09-10 VITALS — BP 166/90 | HR 63 | Ht 67.0 in | Wt 130.8 lb

## 2016-09-10 DIAGNOSIS — K219 Gastro-esophageal reflux disease without esophagitis: Secondary | ICD-10-CM

## 2016-09-10 DIAGNOSIS — R51 Headache: Secondary | ICD-10-CM

## 2016-09-10 DIAGNOSIS — I482 Chronic atrial fibrillation: Secondary | ICD-10-CM

## 2016-09-10 DIAGNOSIS — Z79899 Other long term (current) drug therapy: Secondary | ICD-10-CM | POA: Diagnosis not present

## 2016-09-10 DIAGNOSIS — J45909 Unspecified asthma, uncomplicated: Secondary | ICD-10-CM | POA: Diagnosis not present

## 2016-09-10 DIAGNOSIS — Z87891 Personal history of nicotine dependence: Secondary | ICD-10-CM | POA: Diagnosis not present

## 2016-09-10 DIAGNOSIS — I480 Paroxysmal atrial fibrillation: Secondary | ICD-10-CM

## 2016-09-10 DIAGNOSIS — Z9889 Other specified postprocedural states: Secondary | ICD-10-CM | POA: Insufficient documentation

## 2016-09-10 DIAGNOSIS — H6123 Impacted cerumen, bilateral: Secondary | ICD-10-CM

## 2016-09-10 DIAGNOSIS — I10 Essential (primary) hypertension: Secondary | ICD-10-CM

## 2016-09-10 DIAGNOSIS — J3489 Other specified disorders of nose and nasal sinuses: Secondary | ICD-10-CM | POA: Diagnosis not present

## 2016-09-10 DIAGNOSIS — Z5321 Procedure and treatment not carried out due to patient leaving prior to being seen by health care provider: Secondary | ICD-10-CM | POA: Diagnosis not present

## 2016-09-10 DIAGNOSIS — F17211 Nicotine dependence, cigarettes, in remission: Secondary | ICD-10-CM

## 2016-09-10 DIAGNOSIS — I4891 Unspecified atrial fibrillation: Secondary | ICD-10-CM | POA: Insufficient documentation

## 2016-09-10 DIAGNOSIS — Z7901 Long term (current) use of anticoagulants: Secondary | ICD-10-CM

## 2016-09-10 MED ORDER — RANITIDINE HCL 150 MG PO TABS: 150.0000 mg | ORAL_TABLET | Freq: Two times a day (BID) | ORAL | 1 refills | Status: DC

## 2016-09-10 MED ORDER — FLUTICASONE PROPIONATE 50 MCG/ACT NA SUSP: 2.0000 | Freq: Every day | NASAL | 2 refills | Status: DC

## 2016-09-10 MED ORDER — LOSARTAN POTASSIUM 25 MG PO TABS: 25.0000 mg | ORAL_TABLET | Freq: Every day | ORAL | 1 refills | Status: DC

## 2016-09-10 MED ORDER — CARBAMIDE PEROXIDE 6.5 % OT SOLN: 5.0000 [drp] | Freq: Two times a day (BID) | OTIC | 0 refills | Status: DC

## 2016-09-10 NOTE — Progress Notes (Signed)
Primary Care Physician: [name], MD Referring Physician: [facility] f/u   [name] is a 66 y.o. female with a h/o afib maintaining SR on flecainide.She is in the afib clinic for f/u. She is in SR. BP elevated today but rechecked at 158/84. She states that she ate pickled pig feet yesterday. She is saying that she was fine this am but when she got out in the cold air developed a H/A which persists. She says that she is prone to h/a's due to sinus congestion. Has had very little breakthrough afib on flecainide. She had some rectal bleeding a couple of weeks ago after taking a laxative for being constipated.  Today, she denies symptoms of palpitations, chest pain, shortness of breath, orthopnea, PND, lower extremity edema, dizziness, presyncope, syncope, or neurologic sequela. The patient is tolerating medications without difficulties and is otherwise without complaint today.   Past Medical History:  Diagnosis Date  . A-fib ([place])   . Asthma   . Chronic anticoagulation   . GERD (gastroesophageal reflux disease)   . Hypertension    Past Surgical History:  Procedure Laterality Date  . TONSILLECTOMY    . TUBAL LIGATION      Current Outpatient Prescriptions  Medication Sig Dispense Refill  . albuterol (PROVENTIL HFA;VENTOLIN HFA) 108 (90 Base) MCG/ACT inhaler Inhale 2 puffs into the lungs every 6 (six) hours as needed for wheezing or shortness of breath.    Marland Kitchen albuterol (PROVENTIL) (2.5 MG/3ML) 0.083% nebulizer solution Take 3 mLs (2.5 mg total) by nebulization every 6 (six) hours as needed for wheezing or shortness of breath. 75 mL 12  . atorvastatin (LIPITOR) 40 MG tablet Take 1 tablet (40 mg total) by mouth daily. 30 tablet 11  . Cetirizine HCl 10 MG CAPS Take 1 capsule (10 mg total) by mouth every morning. 30 capsule 11  . diltiazem (CARDIZEM CD) 120 MG 24 hr capsule Take 1 capsule (120 mg total) by mouth daily. 30 capsule 6  . diltiazem (CARDIZEM) 60 MG tablet 1 tablet by mouth  every 6 hours AS NEEDED for sensation of rapid heartbeat. 45 tablet 2  . flecainide (TAMBOCOR) 50 MG tablet Take 1 tablet (50 mg total) by mouth 2 (two) times daily. 60 tablet 6  . hydrochlorothiazide (HYDRODIURIL) 25 MG tablet Take 1 tablet (25 mg total) by mouth daily. 90 tablet 0  . potassium chloride SA (K-DUR,KLOR-CON) 20 MEQ tablet TAKE 1 TABLET(20 MEQ) BY MOUTH TWICE DAILY WITH FOOD 60 tablet 5  . rivaroxaban (XARELTO) 20 MG TABS tablet Take 1 tablet (20 mg total) by mouth daily with supper. 30 tablet 6  . Skin Protectants, Misc. (EUCERIN) cream Apply topically as needed for wound care. 397 g 0  . tiotropium (SPIRIVA HANDIHALER) 18 MCG inhalation capsule INHALE THE CONTENTS OF 1 CAPSULE(18 MCG) VIA INHALATION DEVICE EVERY DAY 30 capsule 5  . triamcinolone (KENALOG) 0.025 % ointment Apply topically 2 (two) times daily. As needed for rash. Apply to lower leg for the next 4 weeks. 30 g 0   Current Facility-Administered Medications  Medication Dose Route Frequency Provider Last Rate Last Dose  . triamcinolone (KENALOG) 0.025 % cream   Topical BID PRN Rushil Sherrye Payor, MD        Allergies  Allergen Reactions  . Protonix [Pantoprazole Sodium]     Heart races  . Celecoxib Rash    Social History   Social History  . Marital status: Single    Spouse name: N/A  .  Number of children: N/A  . Years of education: N/A   Occupational History  . Not on file.   Social History Main Topics  . Smoking status: Former Smoker    Types: Cigarettes    Quit date: 10/24/2015  . Smokeless tobacco: Never Used  . Alcohol use No  . Drug use: Yes    Types: Marijuana     Comment: Smoked marijuana in the past.  . Sexual activity: Yes    Partners: Male    Birth control/ protection: Post-menopausal   Other Topics Concern  . Not on file   Social History Narrative   Lives alone in [place], [place] and has 2 daughters who are within driving distance   Enjoys watching soap operas, reading, exercise        Family History  Problem Relation Age of Onset  . Heart attack Mother     [name] age  . Breast cancer Sister     Late 61s; now s/p mastectomy   . Cancer Sister   . Cancer Sister   . Cancer Sister     ROS- All systems are reviewed and negative except as per the HPI above  Physical Exam: Vitals:   09/10/16 1108  BP: (!) 166/90  Pulse: 63  Weight: 130 lb 12.8 oz (59.3 kg)  Height: 5\' 7"  (1.702 m)   Wt Readings from Last 3 Encounters:  09/10/16 130 lb (59 kg)  09/10/16 130 lb 12.8 oz (59.3 kg)  05/17/16 125 lb 9.6 oz (57 kg)    Labs: Lab Results  Component Value Date   NA 138 02/12/2016   K 4.6 02/12/2016   CL 106 02/12/2016   CO2 22 02/12/2016   GLUCOSE 96 02/12/2016   BUN 15 02/12/2016   CREATININE 0.86 02/12/2016   CALCIUM 10.3 02/12/2016   MG 2.0 06/11/2015   Lab Results  Component Value Date   INR 1.60 (H) 06/11/2015   Lab Results  Component Value Date   CHOL 245 (H) 03/17/2016   HDL 94 03/17/2016   LDLCALC 132 (H) 03/17/2016   TRIG 95 03/17/2016     GEN- The patient is well appearing, alert and oriented x 3 today.   Head- normocephalic, atraumatic Eyes-  Sclera clear, conjunctiva pink Ears- hearing intact Oropharynx- clear Neck- supple, no JVP Lymph- no cervical lymphadenopathy Lungs- Clear to ausculation bilaterally, normal work of breathing Heart- Regular rate and rhythm, no murmurs, rubs or gallops, PMI not laterally displaced GI- soft, NT, ND, + BS Extremities- no clubbing, cyanosis, or edema MS- no significant deformity or atrophy Skin- no rash or lesion Psych- euthymic mood, full affect Neuro- strength and sensation are intact  EKG-Normal sinus rhythm at 63 bpm, pr int 130 ms, qrs int 84 ms, qtc 413 ms Epic records reviewed    Assessment and Plan: 1. Afib Doing well on flecainide/cardizem Continue 50 mg bid  Continue xarelto  2. HTN Elevated today Recheck 158/84 Ate salty foods yesterday Avoid salt  3. H/A Somewhat  chronic Advised to return home and try tylenol  F/u in afib clinic x 3 months  [name], [facility] [address] [place], [place] [address] [phone]

## 2016-09-10 NOTE — Patient Instructions (Addendum)
It was a pleasure to see you today [name].  I would like to address several problems today:  1. For your blood pressure start taking losartan 25MG  once daily  2. For your sinus congestion and pain start using fluticasone 2 sprays per nostril once daily. You should also continue taking your cetirizine 10mg  once daily  3. For your acid reflux restart taking Zantac (ranitidine) 150mg  twice daily  4. For your large amount of earwax use Debrox eardrops twice daily in each ear and avoid putting anything into your ears to try clearing them. If your hearing gets significantly worse let us know since removing the obstructing earwax should be performed in a clinic  I recommend following up with your PCP Dr. Reesa Chew in February about these problems and any medication changes at that time.

## 2016-09-10 NOTE — ED Triage Notes (Signed)
Pt went to see her cardiologist for a regular check-up and was told to come here b/c she had a headache and her sbp in the 170's.  Pt also c/o sinus drainage and pressure.

## 2016-09-10 NOTE — Progress Notes (Signed)
   CC: Hypertension  HPI:  [name] is a 66 y.o. woman with a history of Hypertension, GERD, chronic atrial fibrillation on anticoagulation, and asthma here today after she was seen at her clinic for Afib and noted to be hypertensive. She is also having a significant headache today with accompanying sinus pressure. The sinus symptoms and headache infection going on in the past week but did get worse today. She has had some feeling like her ears are clogged up with intermittently worsened hearing but denies any pain associated with this. She doesn't describe chest pain in particular but does say her heartburn has been symptomatic since she had run out of Zantac that she was taking in the past. She denies any trouble with blurring vision, shortness of breath or dizziness. She did not recently miss any of her medications.  Her blood pressure was controlled in the past on lisinopril, HCTZ, and her cardizem taken for Afib, but lisinopril was discontinued due to frequent headaches when starting this medication. Last year she was mildly uncontrolled on cardizem and HCTZ alone.   See problem based assessment and plan below for additional details  Past Medical History:  Diagnosis Date  . A-fib ([place])   . Asthma   . Chronic anticoagulation   . GERD (gastroesophageal reflux disease)   . Hypertension     Review of Systems:  Review of Systems  Constitutional: Negative for fever.  HENT: Positive for congestion and sinus pain. Negative for ear pain, hearing loss and tinnitus.   Eyes: Negative for blurred vision.  Respiratory: Negative for shortness of breath.   Cardiovascular: Negative for chest pain.  Gastrointestinal: Positive for heartburn.  Neurological: Negative for dizziness.    Physical Exam: Physical Exam  Constitutional: She is well-developed, well-nourished, and in no distress.  HENT:  Some tenderness to palpation over frontal sinuses TMs unable to be visualized b/l due to  large amount of cerumen in ear canals  Eyes: Conjunctivae are normal.  Neck: No JVD present.  Cardiovascular: Normal rate and regular rhythm.   Pulmonary/Chest: Effort normal and breath sounds normal.  Musculoskeletal: She exhibits no edema.  Lymphadenopathy:    She has no cervical adenopathy.  Skin: No rash noted.    Vitals:   09/10/16 1429 09/10/16 1458  BP: (!) 181/77 (!) 156/81  Pulse: (!) 58 (!) 55  Temp: 98.2 F (36.8 C)   TempSrc: Oral   SpO2: 100%   Weight: 130 lb 6.4 oz (59.1 kg)     Assessment & Plan:   See Encounters Tab for problem based charting.  Patient discussed with Dr. Beryle Beams

## 2016-09-10 NOTE — ED Notes (Signed)
Pt left without being seen.

## 2016-09-11 LAB — BMP8+ANION GAP
ANION GAP: 14 mmol/L (ref 10.0–18.0)
BUN/Creatinine Ratio: 18 (ref 12–28)
BUN: 13 mg/dL (ref 8–27)
CALCIUM: 9.8 mg/dL (ref 8.7–10.3)
CO2: 25 mmol/L (ref 18–29)
Chloride: 102 mmol/L (ref 96–106)
Creatinine, Ser: 0.73 mg/dL (ref 0.57–1.00)
GFR calc non Af Amer: 85 mL/min/{1.73_m2} (ref >59)
GFR, EST AFRICAN AMERICAN: 99 mL/min/{1.73_m2} (ref >59)
GLUCOSE: 80 mg/dL (ref 65–99)
Potassium: 3.3 mmol/L — ABNORMAL LOW (ref 3.5–5.2)
SODIUM: 141 mmol/L (ref 134–144)

## 2016-09-13 ENCOUNTER — Telehealth: Payer: Self-pay

## 2016-09-13 NOTE — Assessment & Plan Note (Addendum)
Cerumen is enough to obscure tympanic membranes but hearing is grossly intact bilaterally. She denies the use of any instruments in her ears and I discouraged doing so in the future. Clinic performed cerumen clearance is not needed today but if she starts having hearing problems we can see her or refer to ENT. I will recommend drops to see if loosening this cerumen makes an improvement.  -Debrox 5 drops twice daily -RTC if hearing problems develop

## 2016-09-13 NOTE — Telephone Encounter (Signed)
Requesting lab result. Please call back. 

## 2016-09-13 NOTE — Assessment & Plan Note (Signed)
Her blood pressure was 181/77 on arrival to the clinic but after resting for several minutes improved to 156/81. I do not think she is in hypertensive urgency today. Her headaches seem more secondary to sinus symptoms as she also has sinus tenderness and ear complaints. She is mild to moderately uncontrolled on her Cardizem and hydrochlorothiazide so addition of a third agent is appropriate. She stopped lisinopril in the past for dizziness so trial of an ARB should be fine.  Continue Cardizem 120mg  (Afib) Continue HCTZ 25mg  Start losartan 25mg 

## 2016-09-13 NOTE — Assessment & Plan Note (Signed)
She is having sinus pain today along with significant congestion. It is unclear if this headache is actually from her sinus congestion versus a viral illness underlying them. She is extremely resistant to trying intranasal treatments so sinus irrigation is probably not realistic from her at this time but she is agreeable to at least trying saline spray and Flonase.   -Cetirizine 10mg  -Nasal saline rinse or spray as tolerated -Start Flonase

## 2016-09-13 NOTE — Assessment & Plan Note (Signed)
She is having symptoms of heartburn since stopping Zantac with a known history of GERD. I will recommend restarting this BID today and if it becomes well controlled she can consider if using this as a PRN would be adequate in the future.  -Zantac 150mg  BID

## 2016-09-14 NOTE — Progress Notes (Signed)
Medicine attending: Medical history, presenting problems, physical findings, and medications, reviewed with resident physician Dr Christopher Rice on the day of the patient visit and I concur with his evaluation and management plan. 

## 2016-09-14 NOTE — Telephone Encounter (Signed)
Spoke with patient-she was given bmet results-potassium 3.3, will checking with ordering MD to see if she needs to make any changes to medications, otherwise pt will f/u with her pcp on 09/24/16.    Pt also stated that she when she takes her potassium pills with her atorvastatin it makes her feel bad.  Pt encouraged to take separately-will try taking the atorvastatin at night/evening.   Pt will call clinic and hold off taking atorvastatin if she is still unable to tolerate or develop any muscle pain, vomiting, nausea, headache, dizziness, or stomach cramps. Regenia Skeeter, Vester Balthazor Cassady1/23/20189:43 AM

## 2016-09-21 ENCOUNTER — Telehealth: Payer: Self-pay | Admitting: Internal Medicine

## 2016-09-21 NOTE — Telephone Encounter (Signed)
Lm for rtc 

## 2016-09-21 NOTE — Telephone Encounter (Signed)
Pt says new blood pressure medication is making her have bad headaches.

## 2016-09-23 ENCOUNTER — Telehealth: Payer: Self-pay | Admitting: Internal Medicine

## 2016-09-23 ENCOUNTER — Telehealth (HOSPITAL_COMMUNITY): Payer: Self-pay | Admitting: *Deleted

## 2016-09-23 NOTE — Telephone Encounter (Signed)
Pt cld to let us know that she feels like her rate is increasing and she wanted to know if she can take her break through pill.  Pt was told since she took her normal med dose last night of dilt 120 mg and flecainide 50 mg, she can take the breakthrough cardizem 60 this morning and call us back in a couple of hours and let us know how she is feeling.  Pt understood after going over the different meds and doses.  I asked the pt to confirm the doses and names on the bottles since she kept asking about taking extra flecainide.  The pt was instructed NOT to take extra flecainide, only take the reg dose and the 60 mg breakthrough diltiazem.  Pt understood

## 2016-09-23 NOTE — Telephone Encounter (Signed)
APT. REMINDER CALL, LMTCB °

## 2016-09-23 NOTE — Telephone Encounter (Signed)
Spoke to pt on 1/30, she had a sinus infection, she has a f/u tomorrow w/ dr Reesa Chew, she did have some problems with her sinus med but cardiologist is f/u on that. Called her today, she laughed and stated [name] we talked the other day and I felt better.

## 2016-09-24 ENCOUNTER — Ambulatory Visit (HOSPITAL_COMMUNITY): Payer: Self-pay | Admitting: Nurse Practitioner

## 2016-09-24 ENCOUNTER — Ambulatory Visit (INDEPENDENT_AMBULATORY_CARE_PROVIDER_SITE_OTHER): Payer: Medicare Other | Admitting: Internal Medicine

## 2016-09-24 VITALS — BP 143/75 | HR 64 | Temp 98.2°F | Wt 130.0 lb

## 2016-09-24 DIAGNOSIS — K5904 Chronic idiopathic constipation: Secondary | ICD-10-CM

## 2016-09-24 DIAGNOSIS — Z124 Encounter for screening for malignant neoplasm of cervix: Secondary | ICD-10-CM

## 2016-09-24 DIAGNOSIS — R634 Abnormal weight loss: Secondary | ICD-10-CM

## 2016-09-24 DIAGNOSIS — Z79899 Other long term (current) drug therapy: Secondary | ICD-10-CM

## 2016-09-24 DIAGNOSIS — Z7901 Long term (current) use of anticoagulants: Secondary | ICD-10-CM

## 2016-09-24 DIAGNOSIS — H6123 Impacted cerumen, bilateral: Secondary | ICD-10-CM | POA: Diagnosis not present

## 2016-09-24 DIAGNOSIS — Z23 Encounter for immunization: Secondary | ICD-10-CM | POA: Diagnosis not present

## 2016-09-24 DIAGNOSIS — Z13820 Encounter for screening for osteoporosis: Secondary | ICD-10-CM

## 2016-09-24 DIAGNOSIS — I480 Paroxysmal atrial fibrillation: Secondary | ICD-10-CM | POA: Diagnosis not present

## 2016-09-24 DIAGNOSIS — I10 Essential (primary) hypertension: Secondary | ICD-10-CM | POA: Diagnosis not present

## 2016-09-24 DIAGNOSIS — J3489 Other specified disorders of nose and nasal sinuses: Secondary | ICD-10-CM

## 2016-09-24 DIAGNOSIS — F17211 Nicotine dependence, cigarettes, in remission: Secondary | ICD-10-CM | POA: Diagnosis not present

## 2016-09-24 DIAGNOSIS — Z1211 Encounter for screening for malignant neoplasm of colon: Secondary | ICD-10-CM

## 2016-09-24 DIAGNOSIS — Z1239 Encounter for other screening for malignant neoplasm of breast: Secondary | ICD-10-CM

## 2016-09-24 IMAGING — DX DG CHEST 1V PORT
1 series · 1 of 1 positions shown · non-contrast
Comparison: None.

CLINICAL DATA: Cold, history of asthma, nodule discharge

EXAM:
PORTABLE CHEST 1 VIEW

[chest ap]
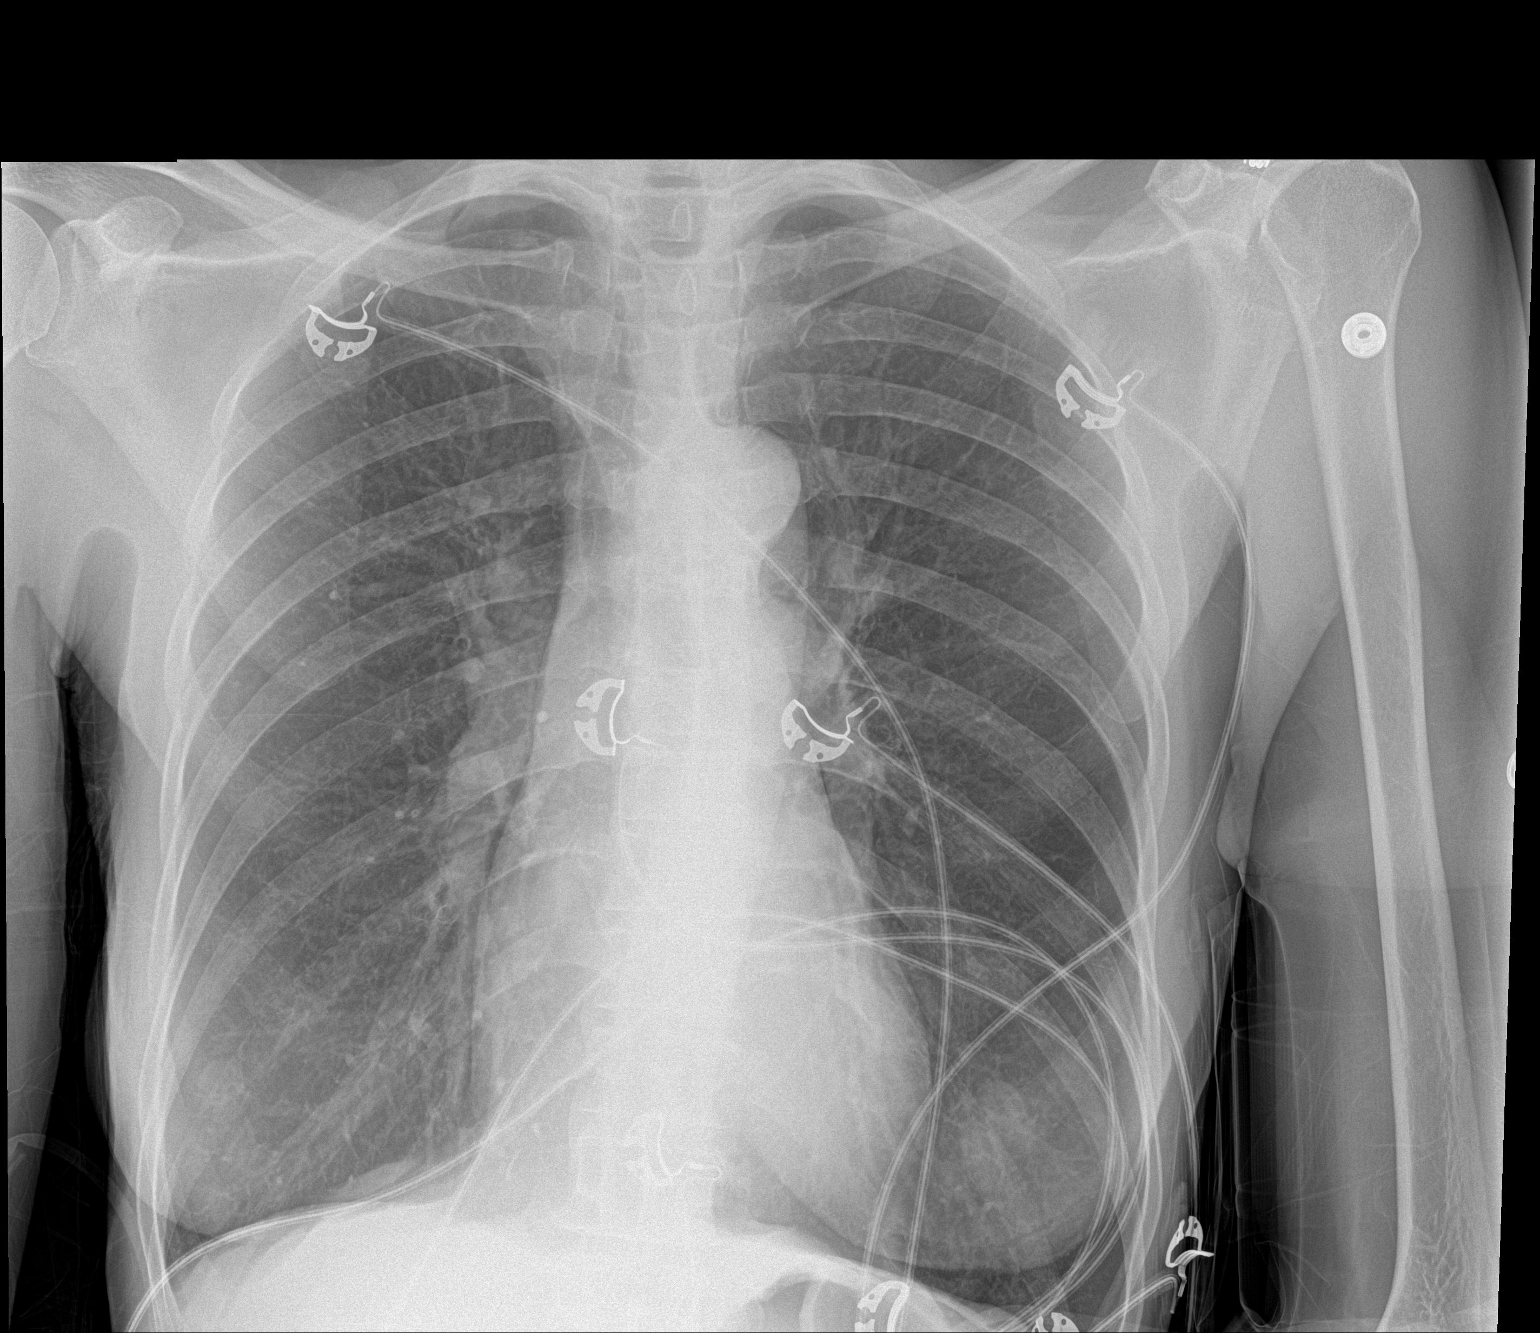

[1 of 1 positions shown; findings below may reference images not displayed]

FINDINGS: Normal mediastinum and cardiac silhouette. Normal pulmonary
vasculature. No evidence of effusion, infiltrate, or pneumothorax.
No acute bony abnormality.
IMPRESSION: No acute cardiopulmonary process.

## 2016-09-24 MED ORDER — POLYETHYLENE GLYCOL 3350 17 G PO PACK: 17.0000 g | PACK | Freq: Every day | ORAL | Status: DC

## 2016-09-24 MED ORDER — AZELASTINE HCL 0.1 % NA SOLN: 2.0000 | Freq: Two times a day (BID) | NASAL | 2 refills | Status: DC

## 2016-09-24 NOTE — Assessment & Plan Note (Signed)
She was complaining of worsening hearing loss.  She was having excessive cerumen in both ear canals.  -We cleaned her ears, which result in improvement of her hearing.

## 2016-09-24 NOTE — Progress Notes (Signed)
   CC: For follow-up of her blood pressure.  HPI:  [name] is a 66 y.o. with past medical history as listed below came to the clinic for follow-up of her hypertension. Losartan was added during her last office visit on 09/10/2016. She states that she is not using losartan as it gives her headaches.  She was complaining of constipation.  She also complained of aching, intermittent, bilateral leg pain. No aggravating or alleviating factor. She states that she tried her friend's tramadol which really helped with her pain she was asking for that prescription.  She was also complaining of worsening hearing loss.  She was seen in clinic previously with complaint of sinus congestion, most probably allergic. She was given a prescription of Flonase and saline nasal spray, she states that she do not want to use it as they make her sick when they go back of her throat. She was asking for a different prescription. Her sinus congestion is better, she do get some clear nasal discharge intermittently.  She follow-up in A. fib clinic, currently had no complaints    Past Medical History:  Diagnosis Date  . A-fib ([place])   . Asthma   . Chronic anticoagulation   . GERD (gastroesophageal reflux disease)   . Hypertension     Review of Systems:  As per HPI  Physical Exam:  Vitals:   09/24/16 1518  BP: (!) 143/75  Pulse: 64  Temp: 98.2 F (36.8 C)  TempSrc: Oral  SpO2: 100%  Weight: 130 lb (59 kg)   Vitals:   09/24/16 1518  BP: (!) 143/75  Pulse: 64  Temp: 98.2 F (36.8 C)  TempSrc: Oral  SpO2: 100%  Weight: 130 lb (59 kg)   General: Vital signs reviewed.  Patient is well-developed and well-nourished, in no acute distress and cooperative with exam.  Head: Normocephalic and atraumatic. Ears. Unable to visualize tympanic membrane because of impacted cerumen. Eyes: EOMI, conjunctivae normal, no scleral icterus.  Neck: Supple, trachea midline, normal ROM, no JVD, masses,  thyromegaly, or carotid bruit present.  Cardiovascular: RRR, S1 normal, S2 normal, no murmurs, gallops, or rubs. Pulmonary/Chest: Clear to auscultation bilaterally, no wheezes, rales, or rhonchi. Abdominal: Soft, non-tender, non-distended, BS +, no masses, organomegaly, or guarding present.  Musculoskeletal: No joint deformities, erythema, or stiffness, ROM full and nontender. Extremities: No lower extremity edema bilaterally,  pulses symmetric and intact bilaterally. No cyanosis or clubbing. Neurological: A&O x3, Strength is normal and symmetric bilaterally, cranial nerve II-XII are grossly intact, no focal motor deficit, sensory intact to light touch bilaterally.  Skin: Warm, dry and intact. No rashes or erythema. Psychiatric: Normal mood and affect. speech and behavior is normal. Cognition and memory are normal.  Assessment & Plan:   See Encounters Tab for problem based charting.  Patient discussed with Dr. Angelia Mould

## 2016-09-24 NOTE — Assessment & Plan Note (Signed)
She did not had any sinus congestion today. She states that she does not like Flonase and nasal saline as they give her a back paste and make her sick and passed through her throat.  -Gave her a prescription for Astelin nasal spray.

## 2016-09-24 NOTE — Patient Instructions (Signed)
Thank you for visiting clinic today. You can take MiraLAX for your constipation. I am giving you a new prescription of a nasal spray to help  with your allergic nasal discharge. We are also giving you vaccination for flu and pneumonia. I am also giving you a referral for mammogram, colonoscopy and DEXA scan as we discussed, please make an appointment as directed.

## 2016-09-24 NOTE — Assessment & Plan Note (Signed)
Her weight is stable at 130 pound for the last few visits.

## 2016-09-24 NOTE — Assessment & Plan Note (Signed)
She follow-up in a A. fib clinic, gets prescription for Xarelto from there.  -Continue Xarelto as prescribed.

## 2016-09-24 NOTE — Assessment & Plan Note (Signed)
She was given a referral for mammogram in the past, never had it done, stating that she never received a call for appointment.  -She was provided with a new referral and advise to call them herself to make an appointment.

## 2016-09-24 NOTE — Assessment & Plan Note (Signed)
BP Readings from Last 3 Encounters:  09/24/16 (!) 143/75  09/10/16 (!) 156/81  09/10/16 171/87   Her blood pressure remained mildly elevated. She was tried lisinopril in the past. She was unable to tolerate because of dizziness. Losartan was started on previous office visit. She was not taking that stating that it causes headaches.  -I advised her to try losartan again, as her headache might be due to something else. Headache is not a common side effect of losartan. -Continue hydrochlorothiazide.

## 2016-09-24 NOTE — Assessment & Plan Note (Signed)
Currently in sinus rhythm, no complaints.  She follow-up in A. fib clinic.  -She was advised to continue her diltiazem and flecainide.

## 2016-09-24 NOTE — Assessment & Plan Note (Signed)
She states that she will make an appointment for Pap smear soon.

## 2016-09-25 ENCOUNTER — Other Ambulatory Visit: Payer: Self-pay

## 2016-09-25 ENCOUNTER — Encounter (HOSPITAL_COMMUNITY): Payer: Self-pay | Admitting: *Deleted

## 2016-09-25 ENCOUNTER — Emergency Department (HOSPITAL_COMMUNITY): Payer: Medicare Other

## 2016-09-25 ENCOUNTER — Emergency Department (HOSPITAL_COMMUNITY)
Admission: EM | Admit: 2016-09-25 | Discharge: 2016-09-25 | Disposition: A | Discharge status: Home / Self Care | Payer: Medicare Other | Attending: Emergency Medicine | Admitting: Emergency Medicine

## 2016-09-25 DIAGNOSIS — I10 Essential (primary) hypertension: Secondary | ICD-10-CM | POA: Diagnosis not present

## 2016-09-25 DIAGNOSIS — Z7901 Long term (current) use of anticoagulants: Secondary | ICD-10-CM | POA: Insufficient documentation

## 2016-09-25 DIAGNOSIS — R002 Palpitations: Secondary | ICD-10-CM | POA: Diagnosis present

## 2016-09-25 DIAGNOSIS — Z87891 Personal history of nicotine dependence: Secondary | ICD-10-CM | POA: Diagnosis not present

## 2016-09-25 DIAGNOSIS — J45909 Unspecified asthma, uncomplicated: Secondary | ICD-10-CM | POA: Diagnosis not present

## 2016-09-25 DIAGNOSIS — I4901 Ventricular fibrillation: Secondary | ICD-10-CM | POA: Insufficient documentation

## 2016-09-25 DIAGNOSIS — I4891 Unspecified atrial fibrillation: Secondary | ICD-10-CM

## 2016-09-25 LAB — BASIC METABOLIC PANEL
ANION GAP: 11 (ref 5–15)
BUN: 14 mg/dL (ref 6–20)
CHLORIDE: 103 mmol/L (ref 101–111)
CO2: 27 mmol/L (ref 22–32)
Calcium: 9.9 mg/dL (ref 8.9–10.3)
Creatinine, Ser: 0.79 mg/dL (ref 0.44–1.00)
GFR calc Af Amer: >60 mL/min (ref >60)
GLUCOSE: 122 mg/dL — AB (ref 65–99)
POTASSIUM: 3 mmol/L — AB (ref 3.5–5.1)
Sodium: 141 mmol/L (ref 135–145)

## 2016-09-25 LAB — CBC
HEMATOCRIT: 37.8 % (ref 36.0–46.0)
HEMOGLOBIN: 12.7 g/dL (ref 12.0–15.0)
MCH: 27.1 pg (ref 26.0–34.0)
MCHC: 33.6 g/dL (ref 30.0–36.0)
MCV: 80.6 fL (ref 78.0–100.0)
Platelets: 225 10*3/uL (ref 150–400)
RBC: 4.69 MIL/uL (ref 3.87–5.11)
RDW: 14.8 % (ref 11.5–15.5)
WBC: 5.4 10*3/uL (ref 4.0–10.5)

## 2016-09-25 LAB — I-STAT TROPONIN, ED: Troponin i, poc: 0 ng/mL (ref 0.00–0.08)

## 2016-09-25 LAB — MAGNESIUM: Magnesium: 2.1 mg/dL (ref 1.7–2.4)

## 2016-09-25 MED ORDER — DILTIAZEM LOAD VIA INFUSION
20.0000 mg | Freq: Once | INTRAVENOUS | Status: DC
  Filled 2016-09-25: qty 20

## 2016-09-25 MED ORDER — DILTIAZEM HCL 100 MG IV SOLR
5.0000 mg/h | INTRAVENOUS | Status: DC
  Filled 2016-09-25: qty 100

## 2016-09-25 MED ORDER — POTASSIUM CHLORIDE CRYS ER 20 MEQ PO TBCR
40.0000 meq | EXTENDED_RELEASE_TABLET | Freq: Once | ORAL | Status: AC
  Administered 2016-09-25: 40 meq via ORAL
  Filled 2016-09-25: qty 2

## 2016-09-25 MED ORDER — LORATADINE 10 MG PO TABS: 10.0000 mg | ORAL_TABLET | Freq: Every day | ORAL | Status: DC

## 2016-09-25 NOTE — ED Notes (Signed)
Pt noted to be in sinus rhythm, HR 71. New EKG printed and hand delivered to Dr. Ashok Cordia

## 2016-09-25 NOTE — ED Triage Notes (Signed)
Pt c/o palpitations onset this morning mid non radiating pressure denies SOB, n/v/d, pt reports taking Flucinide pill with intermittent irregular heart beat,  Pt hx of a fib, pt A&O x4

## 2016-09-25 NOTE — Discharge Instructions (Signed)
[name],  Please continue to take your medications as prescribed. Please take your potassium pill this afternoon and continue to take it as prescribed starting tomorrow morning. Please follow up with your primary care doctor referring your sinus symptoms. Please return if you continue to experience heart palpitations

## 2016-09-25 NOTE — ED Notes (Signed)
Pt reports she got the flu shot yesterday. This morning she felt heart palpitations so she took a Flecainide pill without relief. Denies chest pain but does endorse headache. She states she had stayed hydrated by drinking a lot of water at home.

## 2016-09-25 NOTE — ED Provider Notes (Signed)
[facility] DEPT Provider Note   CSN: [id number] Arrival date & time: 09/25/16  [id number]     History   Chief Complaint Chief Complaint  Patient presents with  . Generalized Body Aches  . Palpitations    HPI [name] is a 66 y.o. female with history of paroxysmal a-fib and HTN that presents to the ED for heart palpitations.   She reports having heart palpitations that started this morning.  She takes flecainide and diltiazem for her afib and states she has been compliant with her medications, last taking flecainide this morning.   She also reports taking a pill this morning, of which she can not recall the name, that was prescribed to her for when she goes into a fib but this did not help her heart palpitations.  She denies shortness of breath or chest pain. HPI  Past Medical History:  Diagnosis Date  . A-fib ([place])   . Asthma   . Chronic anticoagulation   . GERD (gastroesophageal reflux disease)   . Hypertension     Patient Active Problem List   Diagnosis Date Noted  . Excessive cerumen in both ear canals 09/10/2016  . Sinus pain 04/16/2016  . Hyperlipidemia 03/18/2016  . Hyperglycemia 03/17/2016  . Breast cancer screening, high risk patient 03/17/2016  . Eczema 03/17/2016  . Cervical cancer screening 03/17/2016  . Assistance with transportation 03/17/2016  . Acute stress reaction 03/17/2016  . Hypertension   . GERD (gastroesophageal reflux disease)   . PAF (paroxysmal atrial fibrillation) ([place]) 11/26/2015  . Chronic anticoagulation 11/26/2015  . Folliculitis [id number]  . Marijuana abuse 06/11/2015  . Asthma, mild intermittent   . WEIGHT LOSS, ABNORMAL 04/11/2007    Past Surgical History:  Procedure Laterality Date  . TONSILLECTOMY    . TUBAL LIGATION      OB History    No data available       Home Medications    Prior to Admission medications   Medication Sig Start Date End Date Taking? Authorizing Provider  albuterol (PROVENTIL HFA;VENTOLIN HFA)  108 (90 Base) MCG/ACT inhaler Inhale 2 puffs into the lungs every 6 (six) hours as needed for wheezing or shortness of breath.   Yes Historical Provider, MD  atorvastatin (LIPITOR) 40 MG tablet Take 1 tablet (40 mg total) by mouth daily. 05/17/16 05/17/17 Yes [name], MD  azelastine (ASTELIN) 0.1 % nasal spray Place 2 sprays into both nostrils 2 (two) times daily. Use in each nostril as directed 09/24/16 09/24/17 Yes [name], MD  diltiazem (CARDIZEM CD) 120 MG 24 hr capsule Take 1 capsule (120 mg total) by mouth daily. 02/17/16  Yes Sherran Needs, NP  diltiazem (CARDIZEM) 60 MG tablet 1 tablet by mouth every 6 hours AS NEEDED for sensation of rapid heartbeat. 04/01/16  Yes Sherran Needs, NP  flecainide (TAMBOCOR) 50 MG tablet Take 1 tablet (50 mg total) by mouth 2 (two) times daily. 04/01/16  Yes Sherran Needs, NP  hydrochlorothiazide (HYDRODIURIL) 25 MG tablet Take 1 tablet (25 mg total) by mouth daily. 06/26/16  Yes [name], MD  potassium chloride SA (K-DUR,KLOR-CON) 20 MEQ tablet TAKE 1 TABLET(20 MEQ) BY MOUTH TWICE DAILY WITH FOOD 09/01/16  Yes Bartholomew Crews, MD  ranitidine (ZANTAC) 150 MG tablet Take 1 tablet (150 mg total) by mouth 2 (two) times daily. 09/10/16  Yes Collier Salina, MD  rivaroxaban (XARELTO) 20 MG TABS tablet Take 1 tablet (20 mg total) by mouth daily with supper. 07/01/16  Yes Sherran Needs, NP  Skin Protectants, Misc. (EUCERIN) cream Apply topically as needed for wound care. 01/22/16  Yes Carmin Muskrat, MD  tiotropium (SPIRIVA HANDIHALER) 18 MCG inhalation capsule INHALE THE CONTENTS OF 1 CAPSULE(18 MCG) VIA INHALATION DEVICE EVERY DAY 09/01/16  Yes Bartholomew Crews, MD  triamcinolone (KENALOG) 0.025 % ointment Apply topically 2 (two) times daily. As needed for rash. Apply to lower leg for the next 4 weeks. Patient taking differently: Apply 1 application topically 2 (two) times daily as needed (for rash on lower leg).  05/19/16  Yes [name], MD    albuterol (PROVENTIL) (2.5 MG/3ML) 0.083% nebulizer solution Take 3 mLs (2.5 mg total) by nebulization every 6 (six) hours as needed for wheezing or shortness of breath. 07/07/14   Quintella Reichert, MD  carbamide peroxide [facility]) 6.5 % otic solution Place 5 drops into both ears 2 (two) times daily. Patient not taking: Reported on 09/25/2016 09/10/16   Collier Salina, MD  Cetirizine HCl 10 MG CAPS Take 1 capsule (10 mg total) by mouth every morning. Patient not taking: Reported on 09/25/2016 09/01/16   Bartholomew Crews, MD  fluticasone [facility]) 50 MCG/ACT nasal spray Place 2 sprays into both nostrils daily. Patient not taking: Reported on 09/25/2016 09/10/16   Collier Salina, MD  losartan (COZAAR) 25 MG tablet Take 1 tablet (25 mg total) by mouth daily. Patient not taking: Reported on 09/25/2016 09/10/16   Collier Salina, MD    Family History Family History  Problem Relation Age of Onset  . Heart attack Mother     [name] age  . Breast cancer Sister     Late 68s; now s/p mastectomy   . Cancer Sister   . Cancer Sister   . Cancer Sister     Social History Social History  Substance Use Topics  . Smoking status: Former Smoker    Types: Cigarettes    Quit date: 10/24/2015  . Smokeless tobacco: Never Used  . Alcohol use No     Allergies   Protonix [pantoprazole sodium] and Celecoxib   Review of Systems Review of Systems  Constitutional: Negative for chills and fever.  HENT: Positive for sinus pressure.   Respiratory: Negative for shortness of breath.   Cardiovascular: Negative for chest pain.  Gastrointestinal: Negative for abdominal pain.  Musculoskeletal: Negative for myalgias.  Neurological: Negative for dizziness.     Physical Exam Updated Vital Signs BP 129/82   Pulse 68   Temp 98.3 F (36.8 C) (Oral)   Resp 16   SpO2 100%   Physical Exam  Constitutional: She is oriented to person, place, and time.  HENT:  Head: Normocephalic and atraumatic.  Eyes: Pupils  are equal, round, and reactive to light.  Cardiovascular:  Irregularly Irregular  Pulmonary/Chest: Effort normal and breath sounds normal. No respiratory distress. She has no wheezes. She has no rales.  Abdominal: Soft. Bowel sounds are normal. She exhibits no distension. There is no tenderness.  Musculoskeletal: She exhibits no edema.  Neurological: She is alert and oriented to person, place, and time.  Skin: Skin is warm and dry.     ED Treatments / Results  Labs (all labs ordered are listed, but only abnormal results are displayed) Labs Reviewed  BASIC METABOLIC PANEL - Abnormal; Notable for the following:       Result Value   Potassium 3.0 (*)    Glucose, Bld 122 (*)    All other components within normal limits  CBC  MAGNESIUM  I-STAT TROPOININ, ED    EKG  EKG Interpretation  Date/Time:  Saturday September 25 2016 10:17:26 EST Ventricular Rate:  118 PR Interval:    QRS Duration: 88 QT Interval:  332 QTC Calculation: 465 R Axis:   76 Text Interpretation:  Atrial fibrillation with rapid ventricular response Non-specific ST-t changes Confirmed by Ashok Cordia  MD, Lennette Bihari ([id number]) on 09/25/2016 10:33:43 AM       Radiology Dg Chest 2 View  Result Date: 09/25/2016 CLINICAL DATA:  Tachycardia. EXAM: CHEST  2 VIEW COMPARISON:  February 10, 2016 FINDINGS: The heart size and mediastinal contours are within normal limits. Both lungs are clear. The visualized skeletal structures are unremarkable. IMPRESSION: No active cardiopulmonary disease. Electronically Signed   By: Dorise Bullion III M.D   On: 09/25/2016 11:13    Procedures Procedures (including critical care time)  Medications Ordered in ED Medications  loratadine (CLARITIN) tablet 10 mg (not administered)  potassium chloride SA (K-DUR,KLOR-CON) CR tablet 40 mEq (40 mEq Oral Given 09/25/16 1250)     Initial Impression / Assessment and Plan / ED Course  I have reviewed the triage vital signs and the nursing notes.  Pertinent  labs & imaging results that were available during my care of the patient were reviewed by me and considered in my medical decision making (see chart for details).   During ED stay patient reverted into sinus rhythm without medication intervention.  Her troponin was negative.  Her potassium was low at 3.0 and she was given an oral dose of Kdur 47mEq and told to take her home potassium pill later this afternoon.  She was advised to return if she continued to have sustained a-fib.    Final Clinical Impressions(s) / ED Diagnoses   Final diagnoses:  Atrial fibrillation with RVR [facility])    New Prescriptions New Prescriptions   No medications on file     Valinda Party, DO 09/25/16 [place], MD 09/26/16 [phone]

## 2016-09-25 NOTE — ED Notes (Signed)
Pt A&ox4, ambulatory at d/c with steady gait, NAD 

## 2016-09-25 NOTE — ED Notes (Signed)
Pt denies hemorrhoids, pt c/o palpitations

## 2016-09-25 NOTE — ED Notes (Signed)
Pharmacy has not sent her claritin tablet yet. Pt no longer wants to wait. Pt states she would just leave

## 2016-09-26 IMAGING — CR DG CHEST 1V PORT
1 series · 1 of 1 positions shown · non-contrast
Comparison: 06/09/2015

CLINICAL DATA: Shortness of breath.  COPD.

EXAM:
PORTABLE CHEST 1 VIEW

[AP]
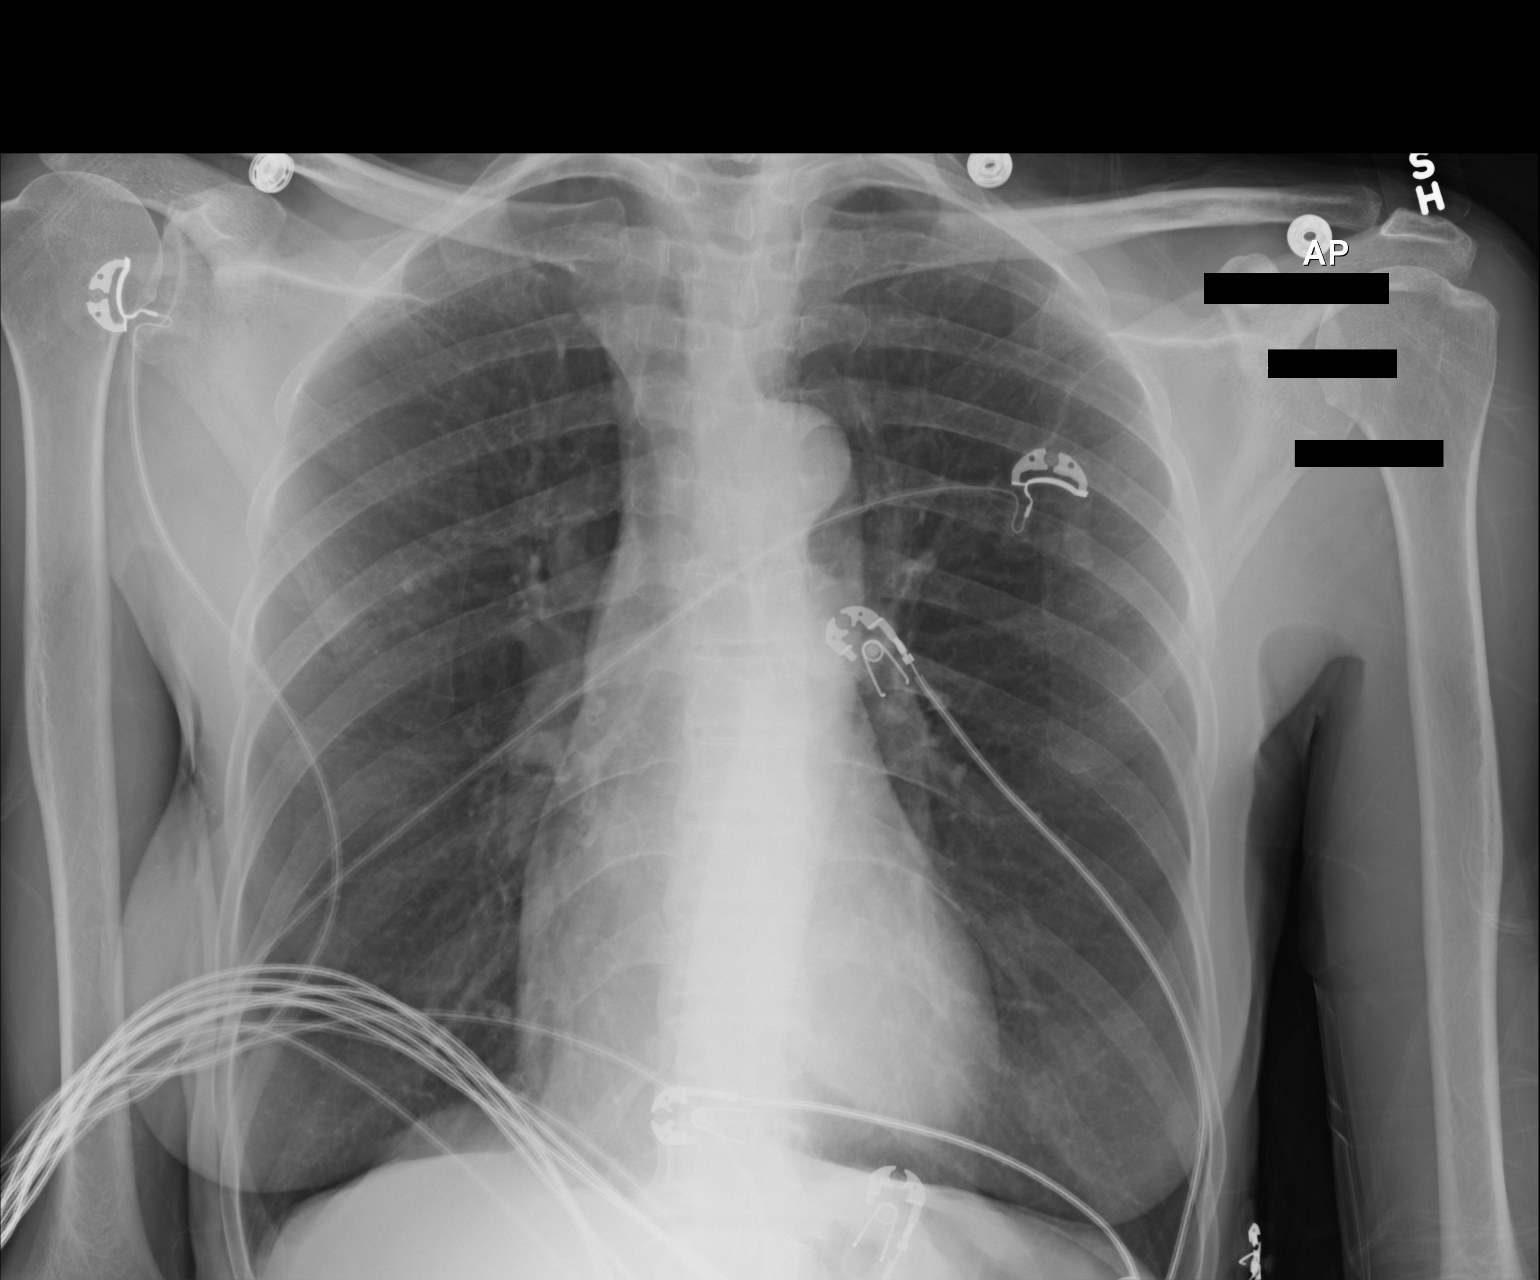

[1 of 1 positions shown; findings below may reference images not displayed]

FINDINGS: The heart size and pulmonary vascularity are normal. The lungs are
clear but hyperinflated consistent with emphysema. No osseous
abnormality.
IMPRESSION: Emphysema.  No acute abnormalities.

## 2016-09-27 ENCOUNTER — Telehealth: Payer: Self-pay | Admitting: Internal Medicine

## 2016-09-27 ENCOUNTER — Encounter: Payer: Self-pay | Admitting: Gastroenterology

## 2016-09-27 NOTE — Telephone Encounter (Signed)
She ask for a good otc laxative, said what the md recommended did not work, she will ask the pharmacist what would be good otc

## 2016-09-27 NOTE — Telephone Encounter (Signed)
Asking if her dr can call

## 2016-09-28 ENCOUNTER — Telehealth (HOSPITAL_COMMUNITY): Payer: Self-pay | Admitting: *Deleted

## 2016-09-28 NOTE — Progress Notes (Signed)
Internal Medicine Clinic Attending  Case discussed with Dr. Amin at the time of the visit.  We reviewed the resident's history and exam and pertinent patient test results.  I agree with the assessment, diagnosis, and plan of care documented in the resident's note.    

## 2016-09-28 NOTE — Telephone Encounter (Signed)
I cld pt to offer an appt to afib clinic in follow up to recent ED visit and to see how she is feeling.  Per pt, she has been good since returning home, no problems and stated that she did not feel like she needed to come in.  She is taking all her meds as normal and believes she was just having difficulty with her GI tract.

## 2016-09-29 ENCOUNTER — Telehealth: Payer: Self-pay

## 2016-09-29 ENCOUNTER — Other Ambulatory Visit: Payer: Self-pay | Admitting: Internal Medicine

## 2016-09-29 MED ORDER — FEXOFENADINE HCL 180 MG PO TABS: 180.0000 mg | ORAL_TABLET | Freq: Every day | ORAL | 0 refills | Status: DC

## 2016-09-29 NOTE — Telephone Encounter (Signed)
Needs to speak with a nurse regarding meds.  

## 2016-09-29 NOTE — Telephone Encounter (Signed)
Pt states she does not like the nose spray, she states she took a pill before sinus before and it worked so much better, could you stop the spray and order a pill?

## 2016-09-29 NOTE — Telephone Encounter (Signed)
I did ordered Allegra to try.

## 2016-09-29 NOTE — Telephone Encounter (Signed)
Pt states now her BP meds and Sinus meds are giving her bad Headaches.  PLease call pt back.

## 2016-09-30 ENCOUNTER — Encounter (HOSPITAL_COMMUNITY): Payer: Self-pay | Admitting: Emergency Medicine

## 2016-09-30 ENCOUNTER — Emergency Department (HOSPITAL_COMMUNITY): Payer: Medicare Other

## 2016-09-30 ENCOUNTER — Emergency Department (HOSPITAL_COMMUNITY)
Admission: EM | Admit: 2016-09-30 | Discharge: 2016-09-30 | Disposition: A | Discharge status: Home / Self Care | Payer: Medicare Other | Attending: Emergency Medicine | Admitting: Emergency Medicine

## 2016-09-30 ENCOUNTER — Other Ambulatory Visit: Payer: Self-pay

## 2016-09-30 DIAGNOSIS — I10 Essential (primary) hypertension: Secondary | ICD-10-CM | POA: Diagnosis present

## 2016-09-30 DIAGNOSIS — Z87891 Personal history of nicotine dependence: Secondary | ICD-10-CM | POA: Insufficient documentation

## 2016-09-30 DIAGNOSIS — J4520 Mild intermittent asthma, uncomplicated: Secondary | ICD-10-CM | POA: Insufficient documentation

## 2016-09-30 DIAGNOSIS — Z79899 Other long term (current) drug therapy: Secondary | ICD-10-CM | POA: Insufficient documentation

## 2016-09-30 DIAGNOSIS — E876 Hypokalemia: Secondary | ICD-10-CM | POA: Insufficient documentation

## 2016-09-30 DIAGNOSIS — I4891 Unspecified atrial fibrillation: Secondary | ICD-10-CM

## 2016-09-30 DIAGNOSIS — R002 Palpitations: Secondary | ICD-10-CM | POA: Diagnosis present

## 2016-09-30 DIAGNOSIS — Z7901 Long term (current) use of anticoagulants: Secondary | ICD-10-CM | POA: Diagnosis not present

## 2016-09-30 DIAGNOSIS — I480 Paroxysmal atrial fibrillation: Secondary | ICD-10-CM | POA: Diagnosis not present

## 2016-09-30 DIAGNOSIS — Z8679 Personal history of other diseases of the circulatory system: Secondary | ICD-10-CM | POA: Diagnosis present

## 2016-09-30 HISTORY — DX: Paroxysmal atrial fibrillation: I48.0

## 2016-09-30 LAB — CBC
HCT: 38.9 % (ref 36.0–46.0)
Hemoglobin: 13.1 g/dL (ref 12.0–15.0)
MCH: 27.3 pg (ref 26.0–34.0)
MCHC: 33.7 g/dL (ref 30.0–36.0)
MCV: 81 fL (ref 78.0–100.0)
PLATELETS: 245 10*3/uL (ref 150–400)
RBC: 4.8 MIL/uL (ref 3.87–5.11)
RDW: 14.9 % (ref 11.5–15.5)
WBC: 4.7 10*3/uL (ref 4.0–10.5)

## 2016-09-30 LAB — BASIC METABOLIC PANEL
Anion gap: 11 (ref 5–15)
Anion gap: 9 (ref 5–15)
BUN: 11 mg/dL (ref 6–20)
BUN: 9 mg/dL (ref 6–20)
CALCIUM: 10.1 mg/dL (ref 8.9–10.3)
CHLORIDE: 103 mmol/L (ref 101–111)
CHLORIDE: 105 mmol/L (ref 101–111)
CO2: 27 mmol/L (ref 22–32)
CO2: 26 mmol/L (ref 22–32)
CREATININE: 0.77 mg/dL (ref 0.44–1.00)
Calcium: 9.4 mg/dL (ref 8.9–10.3)
Creatinine, Ser: 0.72 mg/dL (ref 0.44–1.00)
GFR calc non Af Amer: >60 mL/min (ref >60)
GFR calc non Af Amer: >60 mL/min (ref >60)
GLUCOSE: 120 mg/dL — AB (ref 65–99)
Glucose, Bld: 129 mg/dL — ABNORMAL HIGH (ref 65–99)
Potassium: 3.3 mmol/L — ABNORMAL LOW (ref 3.5–5.1)
Potassium: 3.2 mmol/L — ABNORMAL LOW (ref 3.5–5.1)
SODIUM: 141 mmol/L (ref 135–145)
Sodium: 140 mmol/L (ref 135–145)

## 2016-09-30 LAB — I-STAT TROPONIN, ED: TROPONIN I, POC: 0 ng/mL (ref 0.00–0.08)

## 2016-09-30 LAB — INFLUENZA PANEL BY PCR (TYPE A & B)
INFLBPCR: NEGATIVE
Influenza A By PCR: NEGATIVE

## 2016-09-30 LAB — CK: Total CK: 65 U/L (ref 38–234)

## 2016-09-30 MED ORDER — DILTIAZEM LOAD VIA INFUSION
15.0000 mg | Freq: Once | INTRAVENOUS | Status: AC
  Administered 2016-09-30: 15 mg via INTRAVENOUS
  Filled 2016-09-30: qty 15

## 2016-09-30 MED ORDER — FLECAINIDE ACETATE 100 MG PO TABS: 100.0000 mg | ORAL_TABLET | Freq: Two times a day (BID) | ORAL | 6 refills | Status: DC

## 2016-09-30 MED ORDER — POTASSIUM CHLORIDE CRYS ER 20 MEQ PO TBCR
40.0000 meq | EXTENDED_RELEASE_TABLET | Freq: Once | ORAL | Status: AC
  Administered 2016-09-30: 40 meq via ORAL
  Filled 2016-09-30: qty 2

## 2016-09-30 MED ORDER — DILTIAZEM HCL 100 MG IV SOLR
5.0000 mg/h | INTRAVENOUS | Status: DC
  Administered 2016-09-30: 5 mg/h via INTRAVENOUS
  Filled 2016-09-30: qty 100

## 2016-09-30 MED ORDER — MAGNESIUM OXIDE 400 (241.3 MG) MG PO TABS
800.0000 mg | ORAL_TABLET | Freq: Once | ORAL | Status: AC
Start: 2016-09-30 — End: 2016-09-30
  Administered 2016-09-30: 800 mg via ORAL
  Filled 2016-09-30: qty 2

## 2016-09-30 MED ORDER — MAGNESIUM 200 MG PO TABS
800.0000 mg | ORAL_TABLET | Freq: Once | ORAL | Status: DC
  Filled 2016-09-30: qty 4

## 2016-09-30 MED ORDER — SODIUM CHLORIDE 0.9 % IV BOLUS (SEPSIS)
500.0000 mL | Freq: Once | INTRAVENOUS | Status: AC
  Administered 2016-09-30: 500 mL via INTRAVENOUS

## 2016-09-30 MED ORDER — ACETAMINOPHEN 325 MG PO TABS
650.0000 mg | ORAL_TABLET | Freq: Once | ORAL | Status: AC
  Administered 2016-09-30: 650 mg via ORAL
  Filled 2016-09-30: qty 2

## 2016-09-30 NOTE — ED Provider Notes (Signed)
[facility] DEPT Provider Note   CSN: [id number] Arrival date & time: 09/30/16  [id number]     History   Chief Complaint Chief Complaint  Patient presents with  . Palpitations    HPI SHARDAI GUFFY is a 66 y.o. female.  HPI VIKKIE ADOLPHUS is a 66 y.o. female with history of A. fib, asthma, hypertension, GERD, presents to emergency department complaining of palpitations. Patient states that she has been feeling achy for the last week. She received flu shot on 09/24/2016. She states she was seen in emergency department on the 09/25/16 for similar symptoms and was discharged home. She reports eating breakfast this morning was suddenly began feeling like her heart started to race. She states she developed headache, her body aches worsened, she developed weakness, and came to the emergency department. She reports being compliant with all of her medications. Last took her medications this morning. She is followed by A. fib clinic and by an outpatient clinic downstairs. Patient denies any chest pain. No shortness of breath. Reports associated nasal congestion, no septal throat or cough. Denies any known fever.  Past Medical History:  Diagnosis Date  . A-fib ([place])   . Asthma   . Chronic anticoagulation   . GERD (gastroesophageal reflux disease)   . Hypertension     Patient Active Problem List   Diagnosis Date Noted  . Excessive cerumen in both ear canals 09/10/2016  . Sinus pain 04/16/2016  . Hyperlipidemia 03/18/2016  . Hyperglycemia 03/17/2016  . Breast cancer screening, high risk patient 03/17/2016  . Eczema 03/17/2016  . Cervical cancer screening 03/17/2016  . Assistance with transportation 03/17/2016  . Acute stress reaction 03/17/2016  . Hypertension   . GERD (gastroesophageal reflux disease)   . PAF (paroxysmal atrial fibrillation) ([place]) 11/26/2015  . Chronic anticoagulation 11/26/2015  . Folliculitis [id number]  . Marijuana abuse 06/11/2015  . Asthma, mild intermittent   .  WEIGHT LOSS, ABNORMAL 04/11/2007    Past Surgical History:  Procedure Laterality Date  . TONSILLECTOMY    . TUBAL LIGATION      OB History    No data available       Home Medications    Prior to Admission medications   Medication Sig Start Date End Date Taking? Authorizing Provider  albuterol (PROVENTIL HFA;VENTOLIN HFA) 108 (90 Base) MCG/ACT inhaler Inhale 2 puffs into the lungs every 6 (six) hours as needed for wheezing or shortness of breath.    Historical Provider, MD  albuterol (PROVENTIL) (2.5 MG/3ML) 0.083% nebulizer solution Take 3 mLs (2.5 mg total) by nebulization every 6 (six) hours as needed for wheezing or shortness of breath. 07/07/14   Quintella Reichert, MD  atorvastatin (LIPITOR) 40 MG tablet Take 1 tablet (40 mg total) by mouth daily. 05/17/16 05/17/17  [name], MD  azelastine (ASTELIN) 0.1 % nasal spray Place 2 sprays into both nostrils 2 (two) times daily. Use in each nostril as directed 09/24/16 09/24/17  [name], MD  carbamide peroxide (DEBROX) 6.5 % otic solution Place 5 drops into both ears 2 (two) times daily. Patient not taking: Reported on 09/25/2016 09/10/16   Collier Salina, MD  Cetirizine HCl 10 MG CAPS Take 1 capsule (10 mg total) by mouth every morning. Patient not taking: Reported on 09/25/2016 09/01/16   Bartholomew Crews, MD  diltiazem (CARDIZEM CD) 120 MG 24 hr capsule Take 1 capsule (120 mg total) by mouth daily. 02/17/16   Sherran Needs, NP  diltiazem (CARDIZEM) 60 MG tablet  1 tablet by mouth every 6 hours AS NEEDED for sensation of rapid heartbeat. 04/01/16   Sherran Needs, NP  fexofenadine [facility]) 180 MG tablet Take 1 tablet (180 mg total) by mouth daily. 09/29/16   [name], MD  flecainide (TAMBOCOR) 50 MG tablet Take 1 tablet (50 mg total) by mouth 2 (two) times daily. 04/01/16   Sherran Needs, NP  fluticasone (FLONASE) 50 MCG/ACT nasal spray Place 2 sprays into both nostrils daily. Patient not taking: Reported on 09/25/2016  09/10/16   Collier Salina, MD  hydrochlorothiazide (HYDRODIURIL) 25 MG tablet Take 1 tablet (25 mg total) by mouth daily. 06/26/16   [name], MD  losartan (COZAAR) 25 MG tablet Take 1 tablet (25 mg total) by mouth daily. Patient not taking: Reported on 09/25/2016 09/10/16   Collier Salina, MD  potassium chloride SA (K-DUR,KLOR-CON) 20 MEQ tablet TAKE 1 TABLET(20 MEQ) BY MOUTH TWICE DAILY WITH FOOD 09/01/16   Bartholomew Crews, MD  ranitidine (ZANTAC) 150 MG tablet Take 1 tablet (150 mg total) by mouth 2 (two) times daily. 09/10/16   Collier Salina, MD  rivaroxaban (XARELTO) 20 MG TABS tablet Take 1 tablet (20 mg total) by mouth daily with supper. 07/01/16   Sherran Needs, NP  Skin Protectants, Misc. (EUCERIN) cream Apply topically as needed for wound care. 01/22/16   Carmin Muskrat, MD  tiotropium (SPIRIVA HANDIHALER) 18 MCG inhalation capsule INHALE THE CONTENTS OF 1 CAPSULE(18 MCG) VIA INHALATION DEVICE EVERY DAY 09/01/16   Bartholomew Crews, MD  triamcinolone (KENALOG) 0.025 % ointment Apply topically 2 (two) times daily. As needed for rash. Apply to lower leg for the next 4 weeks. Patient taking differently: Apply 1 application topically 2 (two) times daily as needed (for rash on lower leg).  05/19/16   [name], MD    Family History Family History  Problem Relation Age of Onset  . Heart attack Mother     [name] age  . Breast cancer Sister     Late 40s; now s/p mastectomy   . Cancer Sister   . Cancer Sister   . Cancer Sister     Social History Social History  Substance Use Topics  . Smoking status: Former Smoker    Types: Cigarettes    Quit date: 10/24/2015  . Smokeless tobacco: Never Used  . Alcohol use No     Allergies   Protonix [pantoprazole sodium] and Celecoxib   Review of Systems Review of Systems  Constitutional: Positive for chills. Negative for fever.  Respiratory: Negative for cough, chest tightness and shortness of breath.   Cardiovascular:  Positive for palpitations. Negative for chest pain and leg swelling.  Gastrointestinal: Negative for abdominal pain, diarrhea, nausea and vomiting.  Genitourinary: Negative for dysuria, flank pain, pelvic pain, vaginal bleeding, vaginal discharge and vaginal pain.  Musculoskeletal: Positive for arthralgias and myalgias. Negative for neck pain and neck stiffness.  Skin: Negative for rash.  Neurological: Positive for headaches. Negative for dizziness and weakness.  All other systems reviewed and are negative.    Physical Exam Updated Vital Signs BP (!) 178/164 (BP Location: Right Arm)   Pulse (!) 148   Temp 98.4 F (36.9 C) (Oral)   Resp 16   SpO2 99%   Physical Exam  Constitutional: She is oriented to person, place, and time. She appears well-developed and well-nourished. No distress.  HENT:  Head: Normocephalic.  Eyes: Conjunctivae are normal.  Neck: Neck supple.  Cardiovascular:  Tachycardic,  irregular  Pulmonary/Chest: Effort normal and breath sounds normal. No respiratory distress. She has no wheezes. She has no rales.  Abdominal: Soft. Bowel sounds are normal. She exhibits no distension. There is no tenderness. There is no rebound.  Musculoskeletal: She exhibits no edema.  Neurological: She is alert and oriented to person, place, and time.  Skin: Skin is warm and dry.  Psychiatric: She has a normal mood and affect. Her behavior is normal.  Nursing note and vitals reviewed.    ED Treatments / Results  Labs (all labs ordered are listed, but only abnormal results are displayed) Labs Reviewed  BASIC METABOLIC PANEL - Abnormal; Notable for the following:       Result Value   Potassium 3.3 (*)    Glucose, Bld 129 (*)    All other components within normal limits  CBC  I-STAT TROPOININ, ED    EKG  EKG Interpretation  Date/Time:  Thursday September 30 2016 08:30:49 EST Ventricular Rate:  160 PR Interval:    QRS Duration: 84 QT Interval:  314 QTC Calculation: 512 R  Axis:   75 Text Interpretation:  Atrial fibrillation with rapid ventricular response Marked ST abnormality, possible inferior subendocardial injury Abnormal ECG Confirmed by [facility] MD, PEDRO ([id number]) on 09/30/2016 10:41:34 AM       Radiology Dg Chest 2 View  Result Date: 09/30/2016 CLINICAL DATA:  Atrial fibrillation today. History of atrial fibrillation episodes in the past, asthma, hypertension, former smoker. EXAM: CHEST  2 VIEW COMPARISON:  Chest x-ray of September 25, 2016 FINDINGS: The lungs are hyperinflated with hemidiaphragm flattening. There is no focal infiltrate. There is no pleural effusion. The heart and pulmonary vascularity are normal. The mediastinum is normal in width. The trachea is midline. There is calcification in the wall of the aortic arch. The bony thorax is unremarkable. IMPRESSION: COPD/asthma. No pneumonia, CHF, nor other acute cardiopulmonary abnormality. Thoracic aortic atherosclerosis. Electronically Signed   By: David  Martinique M.D.   On: 09/30/2016 08:58    Procedures Procedures (including critical care time)  Medications Ordered in ED Medications - No data to display   Initial Impression / Assessment and Plan / ED Course  I have reviewed the triage vital signs and the nursing notes.  Pertinent labs & imaging results that were available during my care of the patient were reviewed by me and considered in my medical decision making (see chart for details).    Pt in ED with afib RVR, believes it started at 7:30 this morning although pt is not a great historian. Hx of the same with several prior visits to ED.  Pt also complaining of body aches, chills, congestion. Will start iv fluid bolus. Discussed with Dr. Leonette Monarch, will call cardiology. Cardizem bolus and drip ordered. Will monitor.   12:23 PM Pt currently on cardizem drip. On monitor sinus rhythm. Waiting on cardiology consult.   Pt signed out at shift change pending cardiology consult. Flu test negative.    Final Clinical Impressions(s) / ED Diagnoses   Final diagnoses:  None    New Prescriptions New Prescriptions   No medications on file     Jeannett Senior, PA-C 10/01/16 [place], MD 10/04/16 [phone]

## 2016-09-30 NOTE — ED Notes (Signed)
RN confirmed with Dr. Radford Pax with Cardiology the changes made to patient's medications and wrote on patient's discharge instructions the following: "Take 2 x 50mg  flecainide tablets in the morning and 2 x 50mg  flecainide tablets at night. Continue Cardizem as prescribed. Stop taking hydrochlorothiazide and potassium tablets." Patient verbalized understanding of discharge instructions and denies any further needs or questions at this time. VS stable. Patient ambulatory with steady gait.

## 2016-09-30 NOTE — Telephone Encounter (Signed)
Lm pt a message to try the allegra

## 2016-09-30 NOTE — Consult Note (Signed)
CARDIOLOGY CONSULT NOTE   Patient ID: [name] MRN: [id number], DOB/AGE: 24-Nov-1950 66 y.o. Date of Encounter: 09/30/2016  Primary Physician: [name], MD Primary Cardiologist: Dr Ellyn Hack  Chief Complaint:  PAF  HPI: [name] is a 66 y.o. female with a history of asthma, HTN, GERD, A-Fib on Flecainide, marijuana use, and depression, CHA2DS2VASc=3 (age x 1, HTN, female) on Xarelto.  Pt has been taking OTC rx including cough medicine for a URI. She was otherwise in her USOH this am, but went into rapid atrial fib after breakfast. She was having palpitations and was a little SOB. She was weak, And had a headache. No presyncopeOr syncope.  She knew she was back in atrial fibrillation and came to the emergency room. In the emergency room, she was given 15 mg of Cardizem IV and started on a drip at 5 mg an hour.  She spontaneously converted to sinus rhythm and now her heart rate and rhythm are normal. She feels better from a heart standpoint, but still feels congested. She was negative for the flu by PCR  She states that she has been having problems with constipation and over-the-counter medications and stool softeners are not helping. She doesn't think she has tried Miralax. She had one episode of bright red blood per rectum a couple of days after Christmas. That was in the setting of significant constipation. She has not had any since.   Past Medical History:  Diagnosis Date  . A-fib ([place])   . Asthma   . Chronic anticoagulation   . GERD (gastroesophageal reflux disease)   . Hypertension   . PAF (paroxysmal atrial fibrillation) ([place]) 11/26/2015   April/May 2017: Multiple ED visits for A. Fib.  02/12/16: Improved on diltiazem IV. Cardiology consulted in the ED and discharge patient with diltiazem XR 120 mg once daily with 60 mg by mouth every 6 hours as needed for symptomatic palpitations, flecainide 50 mg twice daily. Referred to the atrial fibrillation clinic.  02/17/16:  Seen in the atrial fibrillation clinic by NP Roderic Palau. Normal sin    Surgical History:  Past Surgical History:  Procedure Laterality Date  . TONSILLECTOMY    . TUBAL LIGATION       I have reviewed the patient's current medications. Prior to Admission medications   Medication Sig Start Date End Date Taking? Authorizing Provider  albuterol (PROVENTIL HFA;VENTOLIN HFA) 108 (90 Base) MCG/ACT inhaler Inhale 2 puffs into the lungs every 6 (six) hours as needed for wheezing or shortness of breath.    Historical Provider, MD  albuterol (PROVENTIL) (2.5 MG/3ML) 0.083% nebulizer solution Take 3 mLs (2.5 mg total) by nebulization every 6 (six) hours as needed for wheezing or shortness of breath. 07/07/14   Quintella Reichert, MD  atorvastatin (LIPITOR) 40 MG tablet Take 1 tablet (40 mg total) by mouth daily. 05/17/16 05/17/17  [name], MD  azelastine (ASTELIN) 0.1 % nasal spray Place 2 sprays into both nostrils 2 (two) times daily. Use in each nostril as directed 09/24/16 09/24/17  [name], MD  carbamide peroxide (DEBROX) 6.5 % otic solution Place 5 drops into both ears 2 (two) times daily. Patient not taking: Reported on 09/25/2016 09/10/16   Collier Salina, MD  Cetirizine HCl 10 MG CAPS Take 1 capsule (10 mg total) by mouth every morning. Patient not taking: Reported on 09/25/2016 09/01/16   Bartholomew Crews, MD  diltiazem (CARDIZEM CD) 120 MG 24 hr capsule Take 1 capsule (120  mg total) by mouth daily. 02/17/16   Sherran Needs, NP  diltiazem (CARDIZEM) 60 MG tablet 1 tablet by mouth every 6 hours AS NEEDED for sensation of rapid heartbeat. 04/01/16   Sherran Needs, NP  fexofenadine [facility] ALLERGY) 180 MG tablet Take 1 tablet (180 mg total) by mouth daily. 09/29/16   [name], MD  flecainide (TAMBOCOR) 50 MG tablet Take 1 tablet (50 mg total) by mouth 2 (two) times daily. 04/01/16   Sherran Needs, NP  fluticasone (FLONASE) 50 MCG/ACT nasal spray Place 2 sprays into both nostrils  daily. Patient not taking: Reported on 09/25/2016 09/10/16   Collier Salina, MD  hydrochlorothiazide (HYDRODIURIL) 25 MG tablet Take 1 tablet (25 mg total) by mouth daily. 06/26/16   [name], MD  losartan (COZAAR) 25 MG tablet Take 1 tablet (25 mg total) by mouth daily. Patient not taking: Reported on 09/25/2016 09/10/16   Collier Salina, MD  potassium chloride SA (K-DUR,KLOR-CON) 20 MEQ tablet TAKE 1 TABLET(20 MEQ) BY MOUTH TWICE DAILY WITH FOOD 09/01/16   Bartholomew Crews, MD  ranitidine (ZANTAC) 150 MG tablet Take 1 tablet (150 mg total) by mouth 2 (two) times daily. 09/10/16   Collier Salina, MD  rivaroxaban (XARELTO) 20 MG TABS tablet Take 1 tablet (20 mg total) by mouth daily with supper. 07/01/16   Sherran Needs, NP  Skin Protectants, Misc. (EUCERIN) cream Apply topically as needed for wound care. 01/22/16   Carmin Muskrat, MD  tiotropium (SPIRIVA HANDIHALER) 18 MCG inhalation capsule INHALE THE CONTENTS OF 1 CAPSULE(18 MCG) VIA INHALATION DEVICE EVERY DAY 09/01/16   Bartholomew Crews, MD  triamcinolone (KENALOG) 0.025 % ointment Apply topically 2 (two) times daily. As needed for rash. Apply to lower leg for the next 4 weeks. Patient taking differently: Apply 1 application topically 2 (two) times daily as needed (for rash on lower leg).  05/19/16   [name], MD   Scheduled Meds: Continuous Infusions: . diltiazem (CARDIZEM) infusion 5 mg/hr (09/30/16 1140)   PRN Meds:.  Allergies:  Allergies  Allergen Reactions  . Protonix [Pantoprazole Sodium] Other (See Comments)    Heart races  . Celecoxib Rash    Social History   Social History  . Marital status: Single    Spouse name: N/A  . Number of children: N/A  . Years of education: N/A   Occupational History  . Retired    Social History Main Topics  . Smoking status: Former Smoker    Types: Cigarettes    Quit date: 10/24/2015  . Smokeless tobacco: Never Used  . Alcohol use No  . Drug use: Yes    Types:  Marijuana     Comment: Smoked marijuana in the past.  . Sexual activity: Yes    Partners: Male    Birth control/ protection: Post-menopausal   Other Topics Concern  . Not on file   Social History Narrative   Lives alone in [place], [place] and has 2 daughters who are within driving distance   Enjoys watching soap operas, reading, exercise       Family History  Problem Relation Age of Onset  . Heart attack Mother     [name] age  . Breast cancer Sister     Late 25s; now s/p mastectomy   . Cancer Sister   . Cancer Sister   . Cancer Sister    Family Status  Relation Status  . Mother Deceased  . Father Deceased   61s  .  Sister Deceased at age 37  . Brother Deceased  . Sister Deceased  . Sister Deceased  . Sister Deceased  . Brother Alive  . Brother Alive  . Brother Alive    Review of Systems:   Full 14-point review of systems otherwise negative except as noted above.  Physical Exam: Blood pressure 124/88, pulse 65, temperature 98.4 F (36.9 C), temperature source Oral, resp. rate 20, SpO2 98 %. General: Well developed, well nourished,female in no acute distress. Head: Normocephalic, atraumatic, sclera non-icteric, no xanthomas, nares are without discharge. Dentition: Poor Neck: No carotid bruits. JVD not elevated. No thyromegally Lungs: Good expansion bilaterally. without wheezes or rhonchi.  Heart: Regular rate and rhythm with S1 S2.  No S3 or S4.  No murmur, no rubs, or gallops appreciated. Abdomen: Soft, non-tender, non-distended with normoactive bowel sounds. No hepatomegaly. No rebound/guarding. No obvious abdominal masses. Msk:  Strength and tone appear normal for age. No joint deformities or effusions, no spine or costo-vertebral angle tenderness. Extremities: No clubbing or cyanosis. No edema.  Distal pedal pulses are 2+ in 4 extrem Neuro: Alert and oriented X 3. Moves all extremities spontaneously. No focal deficits noted. Psych:  Responds to questions  appropriately with a normal affect. Skin: No rashes or lesions noted  Labs:   Lab Results  Component Value Date   WBC 4.7 09/30/2016   HGB 13.1 09/30/2016   HCT 38.9 09/30/2016   MCV 81.0 09/30/2016   PLT 245 09/30/2016    Recent Labs Lab 09/30/16 0836  NA 141  K 3.3*  CL 103  CO2 27  BUN 11  CREATININE 0.77  CALCIUM 10.1  GLUCOSE 129*    Recent Labs  09/30/16 1137  CKTOTAL 65    Recent Labs  09/30/16 0908  TROPIPOC 0.00    Radiology/Studies: Dg Chest 2 View Result Date: 09/30/2016 CLINICAL DATA:  Atrial fibrillation today. History of atrial fibrillation episodes in the past, asthma, hypertension, former smoker. EXAM: CHEST  2 VIEW COMPARISON:  Chest x-ray of September 25, 2016 FINDINGS: The lungs are hyperinflated with hemidiaphragm flattening. There is no focal infiltrate. There is no pleural effusion. The heart and pulmonary vascularity are normal. The mediastinum is normal in width. The trachea is midline. There is calcification in the wall of the aortic arch. The bony thorax is unremarkable. IMPRESSION: COPD/asthma. No pneumonia, CHF, nor other acute cardiopulmonary abnormality. Thoracic aortic atherosclerosis. Electronically Signed   By: David  Martinique M.D.   On: 09/30/2016 08:58     Cardiac Cath: n/a  Echo: 2016 TEE  Left ventricle: The cavity size was normal. Systolic function was  normal. The estimated ejection fraction was in the range of 60%  to 65%. Wall motion was normal; there were no regional wall  motion abnormalities. Doppler parameters are consistent with  abnormal left ventricular relaxation (grade 1 diastolic  dysfunction). - Tricuspid valve: There was mild-moderate regurgitation directed  centrally.  2015 TTE - Left ventricle: The cavity size was normal. Wall thickness was increased in a pattern of mild LVH. The estimated ejection fraction was 65%. Wall motion was normal; there were no regional wall motion abnormalities. - Right  ventricle: The cavity size was normal. Systolic function was normal. - Pulmonary arteries: PA peak pressure: 31 mm Hg (S).  MYOVIEW 11/2015  No T wave inversion was noted during stress.  There was no ST segment deviation noted during stress.  There is a medium defect of mild severity present in the basal inferoseptal, mid inferoseptal and  apical septal location. The defect is non-reversible. This is most consistent with extracardiac activity on both rest and stress. No ischemia noted.  This is a low risk study.  The left ventricular ejection fraction is normal (55-65%). Nuclear stress EF: 58%  ECG: 09/30/2016  Atrial fib, rapid ventricular response, heart rate 160  ASSESSMENT AND PLAN:  Principal Problem:   PAF (paroxysmal atrial fibrillation) ([place]) - pt has spontaneously converted to sinus rhythm - She is on flecainide at 50 mg twice a day. - M.D. advised on increasing the dose. - No increase in Cardizem as she has had some bradycardia on vital sign review - Follow up with the A. fib clinic  Active Problems:   Chronic anticoagulation - Continue Xarelto    Hypertension - Blood pressure is elevated, but she is anxious about the atrial fibrillation today. - If she is not taking losartan, encourage her to start.  - If she is taking it, increase the dose.  **M.D. advise if admission is needed  Signed, Lenoard Aden 09/30/2016 1:33 PM Beeper [phone]

## 2016-09-30 NOTE — ED Notes (Signed)
Cardiology at bedside.

## 2016-09-30 NOTE — ED Triage Notes (Signed)
Pt sts palpitations starting this am; pt sts hx of afib

## 2016-09-30 NOTE — Discharge Instructions (Signed)
Follow up with cardiology

## 2016-10-01 ENCOUNTER — Telehealth (HOSPITAL_COMMUNITY): Payer: Self-pay | Admitting: *Deleted

## 2016-10-01 NOTE — ED Provider Notes (Signed)
Patient care received from PA Tatyana at [facility]. Patient with PMHx of paroxysmal A-fib taking flecainide and Xarelto. Patient with sudden onset A. fib RVR this morning. At time of sign-out cardiology had been consulted and recommendations were pending. Following evaluation cardiology recommends to increase her flecainide to 100 mg twice a day and continue her Cardizem and they will follow her up in clinic.  Pt discharged home in stable condition. Strict ED return precautions dicussed. Pt understands and agrees with the plan and has no further questions or concerns.   Pt care discussed with and followed by my attending, Dr. Carita Pian, MD Pager [phone]   Mayer Camel, MD 10/01/16 [place], DO 10/01/16 [id number]

## 2016-10-01 NOTE — Telephone Encounter (Signed)
-----   Message from Juluis Mire, RN sent at 10/01/2016  8:58 AM EST -----   ----- Message ----- From: Lonn Georgia, PA-C Sent: 09/30/2016   4:48 PM To: Lonn Georgia, PA-C, Sherran Needs, NP  Pt needs an appt with the Afib clinic in 1-2 weeks.  Thanks

## 2016-10-05 ENCOUNTER — Ambulatory Visit: Payer: Self-pay | Admitting: Gastroenterology

## 2016-10-05 ENCOUNTER — Other Ambulatory Visit: Payer: Self-pay

## 2016-10-05 NOTE — Telephone Encounter (Signed)
Needs to speak with a nurse regarding meds. Please call back.  

## 2016-10-06 ENCOUNTER — Emergency Department (HOSPITAL_COMMUNITY)
Admission: EM | Admit: 2016-10-06 | Discharge: 2016-10-06 | Disposition: A | Discharge status: Home / Self Care | Payer: Medicare Other | Attending: Emergency Medicine | Admitting: Emergency Medicine

## 2016-10-06 DIAGNOSIS — I4892 Unspecified atrial flutter: Secondary | ICD-10-CM | POA: Insufficient documentation

## 2016-10-06 DIAGNOSIS — Z7901 Long term (current) use of anticoagulants: Secondary | ICD-10-CM | POA: Diagnosis not present

## 2016-10-06 DIAGNOSIS — R0789 Other chest pain: Secondary | ICD-10-CM | POA: Diagnosis present

## 2016-10-06 DIAGNOSIS — I10 Essential (primary) hypertension: Secondary | ICD-10-CM | POA: Insufficient documentation

## 2016-10-06 DIAGNOSIS — E876 Hypokalemia: Secondary | ICD-10-CM | POA: Diagnosis not present

## 2016-10-06 DIAGNOSIS — K5900 Constipation, unspecified: Secondary | ICD-10-CM | POA: Insufficient documentation

## 2016-10-06 DIAGNOSIS — J45909 Unspecified asthma, uncomplicated: Secondary | ICD-10-CM | POA: Diagnosis not present

## 2016-10-06 DIAGNOSIS — Z79899 Other long term (current) drug therapy: Secondary | ICD-10-CM | POA: Insufficient documentation

## 2016-10-06 LAB — CBC WITH DIFFERENTIAL/PLATELET
BASOS ABS: 0.1 10*3/uL (ref 0.0–0.1)
BASOS PCT: 1 %
Eosinophils Absolute: 0.1 10*3/uL (ref 0.0–0.7)
Eosinophils Relative: 2 %
HEMATOCRIT: 33.7 % — AB (ref 36.0–46.0)
HEMOGLOBIN: 11 g/dL — AB (ref 12.0–15.0)
Lymphocytes Relative: 38 %
Lymphs Abs: 1.6 10*3/uL (ref 0.7–4.0)
MCH: 26.4 pg (ref 26.0–34.0)
MCHC: 32.6 g/dL (ref 30.0–36.0)
MCV: 81 fL (ref 78.0–100.0)
Monocytes Absolute: 0.4 10*3/uL (ref 0.1–1.0)
Monocytes Relative: 10 %
NEUTROS ABS: 2.1 10*3/uL (ref 1.7–7.7)
NEUTROS PCT: 49 %
Platelets: 210 10*3/uL (ref 150–400)
RBC: 4.16 MIL/uL (ref 3.87–5.11)
RDW: 15.1 % (ref 11.5–15.5)
WBC: 4.3 10*3/uL (ref 4.0–10.5)

## 2016-10-06 LAB — PROTIME-INR
INR: 1.03
PROTHROMBIN TIME: 13.5 s (ref 11.4–15.2)

## 2016-10-06 LAB — BASIC METABOLIC PANEL
Anion gap: 7 (ref 5–15)
BUN: 8 mg/dL (ref 6–20)
CHLORIDE: 110 mmol/L (ref 101–111)
CO2: 26 mmol/L (ref 22–32)
Calcium: 8.8 mg/dL — ABNORMAL LOW (ref 8.9–10.3)
Creatinine, Ser: 0.6 mg/dL (ref 0.44–1.00)
GFR calc Af Amer: >60 mL/min (ref >60)
GFR calc non Af Amer: >60 mL/min (ref >60)
Glucose, Bld: 92 mg/dL (ref 65–99)
POTASSIUM: 2.9 mmol/L — AB (ref 3.5–5.1)
SODIUM: 143 mmol/L (ref 135–145)

## 2016-10-06 LAB — URINALYSIS, ROUTINE W REFLEX MICROSCOPIC
Bacteria, UA: NONE SEEN
Bilirubin Urine: NEGATIVE
Glucose, UA: 50 mg/dL — AB
Ketones, ur: NEGATIVE mg/dL
Leukocytes, UA: NEGATIVE
Nitrite: NEGATIVE
Protein, ur: NEGATIVE mg/dL
SPECIFIC GRAVITY, URINE: 1.003 — AB (ref 1.005–1.030)
WBC UA: NONE SEEN WBC/hpf (ref 0–5)
pH: 9 — ABNORMAL HIGH (ref 5.0–8.0)

## 2016-10-06 LAB — I-STAT TROPONIN, ED: Troponin i, poc: 0 ng/mL (ref 0.00–0.08)

## 2016-10-06 MED ORDER — POTASSIUM CHLORIDE CRYS ER 20 MEQ PO TBCR: 20.0000 meq | EXTENDED_RELEASE_TABLET | Freq: Two times a day (BID) | ORAL | 0 refills | Status: DC

## 2016-10-06 MED ORDER — METOPROLOL TARTRATE 5 MG/5ML IV SOLN
5.0000 mg | Freq: Once | INTRAVENOUS | Status: AC
  Administered 2016-10-06: 5 mg via INTRAVENOUS
  Filled 2016-10-06: qty 5

## 2016-10-06 MED ORDER — POLYETHYLENE GLYCOL 3350 17 G PO PACK: 17.0000 g | PACK | Freq: Every day | ORAL | 0 refills | Status: DC

## 2016-10-06 MED ORDER — POLYETHYLENE GLYCOL 3350 17 G PO PACK
17.0000 g | PACK | Freq: Every day | ORAL | Status: DC
  Administered 2016-10-06: 17 g via ORAL
  Filled 2016-10-06: qty 1

## 2016-10-06 MED ORDER — POTASSIUM CHLORIDE CRYS ER 20 MEQ PO TBCR
40.0000 meq | EXTENDED_RELEASE_TABLET | Freq: Once | ORAL | Status: AC
  Administered 2016-10-06: 40 meq via ORAL
  Filled 2016-10-06: qty 2

## 2016-10-06 MED ORDER — ACETAMINOPHEN 325 MG PO TABS
650.0000 mg | ORAL_TABLET | Freq: Once | ORAL | Status: AC
Start: 2016-10-06 — End: 2016-10-06
  Administered 2016-10-06: 650 mg via ORAL
  Filled 2016-10-06: qty 2

## 2016-10-06 MED ORDER — POTASSIUM CHLORIDE CRYS ER 20 MEQ PO TBCR: 20.0000 meq | EXTENDED_RELEASE_TABLET | Freq: Every day | ORAL | 0 refills | Status: DC

## 2016-10-06 MED ORDER — SODIUM CHLORIDE 0.9 % IV BOLUS (SEPSIS)
500.0000 mL | Freq: Once | INTRAVENOUS | Status: AC
  Administered 2016-10-06: 500 mL via INTRAVENOUS

## 2016-10-06 NOTE — ED Provider Notes (Signed)
[facility] DEPT Provider Note   CSN: [id number] Arrival date & time: 10/06/16  0906     History   Chief Complaint Chief Complaint  Patient presents with  . Chest Pain    HPI [name] is a 66 y.o. female.  She presents for evaluation of chest discomfort, which started last night. Also, this morning she noticed her heart rate running faster than usual, so took a flecainide tablet. On arrival, the chest pain has resolved.  HPI  Past Medical History:  Diagnosis Date  . A-fib ([place])   . Asthma   . Chronic anticoagulation   . GERD (gastroesophageal reflux disease)   . Hypertension   . PAF (paroxysmal atrial fibrillation) ([place]) 11/26/2015   April/May 2017: Multiple ED visits for A. Fib.  02/12/16: Improved on diltiazem IV. Cardiology consulted in the ED and discharge patient with diltiazem XR 120 mg once daily with 60 mg by mouth every 6 hours as needed for symptomatic palpitations, flecainide 50 mg twice daily. Referred to the atrial fibrillation clinic.  02/17/16: Seen in the atrial fibrillation clinic by NP Roderic Palau. Normal sin    Patient Active Problem List   Diagnosis Date Noted  . Hypokalemia   . Excessive cerumen in both ear canals 09/10/2016  . Sinus pain 04/16/2016  . Hyperlipidemia 03/18/2016  . Hyperglycemia 03/17/2016  . Breast cancer screening, high risk patient 03/17/2016  . Eczema 03/17/2016  . Cervical cancer screening 03/17/2016  . Assistance with transportation 03/17/2016  . Acute stress reaction 03/17/2016  . Hypertension   . GERD (gastroesophageal reflux disease)   . PAF (paroxysmal atrial fibrillation) ([place]) 11/26/2015  . Chronic anticoagulation 11/26/2015  . Folliculitis [id number]  . Marijuana abuse 06/11/2015  . Asthma, mild intermittent   . WEIGHT LOSS, ABNORMAL 04/11/2007    Past Surgical History:  Procedure Laterality Date  . TONSILLECTOMY    . TUBAL LIGATION      OB History    No data available       Home Medications      Prior to Admission medications   Medication Sig Start Date End Date Taking? Authorizing Provider  albuterol (PROVENTIL HFA;VENTOLIN HFA) 108 (90 Base) MCG/ACT inhaler Inhale 2 puffs into the lungs every 6 (six) hours as needed for wheezing or shortness of breath.    Historical Provider, MD  albuterol (PROVENTIL) (2.5 MG/3ML) 0.083% nebulizer solution Take 3 mLs (2.5 mg total) by nebulization every 6 (six) hours as needed for wheezing or shortness of breath. 07/07/14   Quintella Reichert, MD  atorvastatin (LIPITOR) 40 MG tablet Take 1 tablet (40 mg total) by mouth daily. 05/17/16 05/17/17  [name], MD  azelastine (ASTELIN) 0.1 % nasal spray Place 2 sprays into both nostrils 2 (two) times daily. Use in each nostril as directed 09/24/16 09/24/17  [name], MD  carbamide peroxide (DEBROX) 6.5 % otic solution Place 5 drops into both ears 2 (two) times daily. Patient not taking: Reported on 09/25/2016 09/10/16   Collier Salina, MD  Cetirizine HCl 10 MG CAPS Take 1 capsule (10 mg total) by mouth every morning. Patient not taking: Reported on 09/25/2016 09/01/16   Bartholomew Crews, MD  diltiazem (CARDIZEM CD) 120 MG 24 hr capsule Take 1 capsule (120 mg total) by mouth daily. 02/17/16   Sherran Needs, NP  diltiazem (CARDIZEM) 60 MG tablet 1 tablet by mouth every 6 hours AS NEEDED for sensation of rapid heartbeat. 04/01/16   Sherran Needs, NP  fexofenadine Delma Freeze  ALLERGY) 180 MG tablet Take 1 tablet (180 mg total) by mouth daily. 09/29/16   [name], MD  flecainide (TAMBOCOR) 100 MG tablet Take 1 tablet (100 mg total) by mouth 2 (two) times daily. 09/30/16   Rhonda G Barrett, PA-C  fluticasone (FLONASE) 50 MCG/ACT nasal spray Place 2 sprays into both nostrils daily. 09/10/16   Collier Salina, MD  hydrochlorothiazide (HYDRODIURIL) 25 MG tablet Take 1 tablet (25 mg total) by mouth daily. 06/26/16   [name], MD  losartan (COZAAR) 25 MG tablet Take 1 tablet (25 mg total) by mouth daily. Patient not  taking: Reported on 09/25/2016 09/10/16   Collier Salina, MD  polyethylene glycol [facility] / Floria Raveling) packet Take 17 g by mouth daily. For Constipation 10/06/16   Daleen Bo, MD  potassium chloride SA (K-DUR,KLOR-CON) 20 MEQ tablet Take 1 tablet (20 mEq total) by mouth daily. 10/06/16   Daleen Bo, MD  ranitidine (ZANTAC) 150 MG tablet Take 1 tablet (150 mg total) by mouth 2 (two) times daily. 09/10/16   Collier Salina, MD  rivaroxaban (XARELTO) 20 MG TABS tablet Take 1 tablet (20 mg total) by mouth daily with supper. 07/01/16   Sherran Needs, NP  Skin Protectants, Misc. (EUCERIN) cream Apply topically as needed for wound care. 01/22/16   Carmin Muskrat, MD  tiotropium (SPIRIVA HANDIHALER) 18 MCG inhalation capsule INHALE THE CONTENTS OF 1 CAPSULE(18 MCG) VIA INHALATION DEVICE EVERY DAY 09/01/16   Bartholomew Crews, MD  triamcinolone (KENALOG) 0.025 % ointment Apply topically 2 (two) times daily. As needed for rash. Apply to lower leg for the next 4 weeks. Patient taking differently: Apply 1 application topically 2 (two) times daily as needed (for rash on lower leg).  05/19/16   [name], MD    Family History Family History  Problem Relation Age of Onset  . Heart attack Mother     [name] age  . Breast cancer Sister     Late 42s; now s/p mastectomy   . Cancer Sister   . Cancer Sister   . Cancer Sister     Social History Social History  Substance Use Topics  . Smoking status: Former Smoker    Types: Cigarettes    Quit date: 10/24/2015  . Smokeless tobacco: Never Used  . Alcohol use No     Allergies   Protonix [pantoprazole sodium] and Celecoxib   Review of Systems Review of Systems   Physical Exam Updated Vital Signs BP 120/86   Pulse (!) 57   Resp 23   SpO2 100%   Physical Exam   ED Treatments / Results  Labs (all labs ordered are listed, but only abnormal results are displayed) Labs Reviewed  URINALYSIS, ROUTINE W REFLEX MICROSCOPIC - Abnormal;  Notable for the following:       Result Value   Color, Urine COLORLESS (*)    Specific Gravity, Urine 1.003 (*)    pH 9.0 (*)    Glucose, UA 50 (*)    Hgb urine dipstick SMALL (*)    Squamous Epithelial / LPF 0-5 (*)    All other components within normal limits  BASIC METABOLIC PANEL - Abnormal; Notable for the following:    Potassium 2.9 (*)    Calcium 8.8 (*)    All other components within normal limits  CBC WITH DIFFERENTIAL/PLATELET - Abnormal; Notable for the following:    Hemoglobin 11.0 (*)    HCT 33.7 (*)    All other components within normal  limits  Alphonzo Lemmings, ED    EKG  EKG Interpretation  Date/Time:  Wednesday October 06 2016 13:06:20 EST Ventricular Rate:  55 PR Interval:    QRS Duration: 97 QT Interval:  443 QTC Calculation: 424 R Axis:   49 Text Interpretation:  Sinus rhythm RSR' in V1 or V2, probably normal variant Minimal ST elevation, anterior leads Since last tracing of earlier today Atrial flutter has resolved Confirmed by Eulis Foster  MD, Cloy Cozzens [id number]) on 10/06/2016 1:10:36 PM       Radiology No results found.  Procedures Procedures (including critical care time)  Medications Ordered in ED Medications  polyethylene glycol (MIRALAX / GLYCOLAX) packet 17 g (17 g Oral Given 10/06/16 1119)  sodium chloride 0.9 % bolus 500 mL (0 mLs Intravenous Stopped 10/06/16 1120)  metoprolol (LOPRESSOR) injection 5 mg (5 mg Intravenous Given 10/06/16 1023)  acetaminophen (TYLENOL) tablet 650 mg (650 mg Oral Given 10/06/16 1119)  potassium chloride SA (K-DUR,KLOR-CON) CR tablet 40 mEq (40 mEq Oral Given 10/06/16 1312)     Initial Impression / Assessment and Plan / ED Course  I have reviewed the triage vital signs and the nursing notes.  Pertinent labs & imaging results that were available during my care of the patient were reviewed by me and considered in my medical decision making (see chart for details).     Medications  polyethylene glycol  (MIRALAX / GLYCOLAX) packet 17 g (17 g Oral Given 10/06/16 1119)  sodium chloride 0.9 % bolus 500 mL (0 mLs Intravenous Stopped 10/06/16 1120)  metoprolol (LOPRESSOR) injection 5 mg (5 mg Intravenous Given 10/06/16 1023)  acetaminophen (TYLENOL) tablet 650 mg (650 mg Oral Given 10/06/16 1119)  potassium chloride SA (K-DUR,KLOR-CON) CR tablet 40 mEq (40 mEq Oral Given 10/06/16 1312)    Patient Vitals for the past 24 hrs:  BP Pulse Resp SpO2  10/06/16 1300 120/86 (!) 57 23 100 %  10/06/16 1245 120/78 (!) 51 14 100 %  10/06/16 1230 127/77 (!) 54 18 100 %  10/06/16 1215 129/81 (!) 56 20 100 %  10/06/16 1200 130/87 (!) 53 20 100 %  10/06/16 1145 133/95 (!) 42 18 100 %  10/06/16 1115 134/97 (!) 49 19 100 %  10/06/16 1056 137/97 93 20 100 %  10/06/16 1045 124/94 78 21 100 %  10/06/16 1000 132/86 (!) 128 18 99 %  10/06/16 0945 140/99 105 19 100 %  10/06/16 0930 156/85 95 24 100 %    CHA2DS2/VAS Stroke Risk Points      3 >= 2 Points: High Risk  1 - 1.99 Points: Medium Risk  0 Points: Low Risk    The previous score was 2 on 04/16/2016.:  Change:         Details    Note: External data might be a factor in metrics not marked with    Points Metrics   This score determines the patient's risk of having a stroke if the  patient has atrial fibrillation.       0 Has Congestive Heart Failure:  No   0 Has Vascular Disease:  No   1 Has Hypertension:  Yes   1 Age:  26   0 Has Diabetes:  No   0 Had Stroke:  No Had TIA:  No Had thromboembolism:  No   1 Female:  Yes         2:14 PM Reevaluation with update and discussion. After initial assessment and treatment, an updated evaluation  reveals patient is comfortable now, findings discussed with her, all questions answered. Amarys Sliwinski L   CRITICAL CARE Performed by: Daleen Bo L Total critical care time: 35 minutes Critical care time was exclusive of separately billable procedures and treating other patients. Critical care was necessary to  treat or prevent imminent or life-threatening deterioration. Critical care was time spent personally by me on the following activities: development of treatment plan with patient and/or surrogate as well as nursing, discussions with consultants, evaluation of patient's response to treatment, examination of patient, obtaining history from patient or surrogate, ordering and performing treatments and interventions, ordering and review of laboratory studies, ordering and review of radiographic studies, pulse oximetry and re-evaluation of patient's condition.   Final Clinical Impressions(s) / ED Diagnoses   Final diagnoses:  Atrial flutter, unspecified type (HCC)  Hypokalemia  Constipation, unspecified constipation type   Palpitation, with history of fibrillation, presenting in atrial flutter, rapid rate, controlled with a single dose of Lopressor, and IV fluid bolus. ACS, PE or pneumonia. Patient with recurrent hypokalemia, despite being taken off of diuretic medication. Recent magnesium level normal. Likely nutritional deficit, long-standing, which can be treated with gentle potassium supplementation. The patient has a follow-up appointment with her cardiologist, tomorrow. Patient understands to discuss ongoing potassium treatment with her. Un-associated constipation, recommended to treat with MiraLAX.   Nursing Notes Reviewed/ Care Coordinated Applicable Imaging Reviewed Interpretation of Laboratory Data incorporated into ED treatment  The patient appears reasonably screened and/or stabilized for discharge and I doubt any other medical condition or other [facility] requiring further screening, evaluation, or treatment in the ED at this time prior to discharge.  Plan: Home Medications- continue usual; Home Treatments- dry to eat foods which contain potassium and fiber.; return here if the recommended treatment, does not improve the symptoms; Recommended follow up- PCP and cardiology as scheduled.   New  Prescriptions New Prescriptions   POLYETHYLENE GLYCOL (MIRALAX / GLYCOLAX) PACKET    Take 17 g by mouth daily. For Constipation   POTASSIUM CHLORIDE SA (K-DUR,KLOR-CON) 20 MEQ TABLET    Take 1 tablet (20 mEq total) by mouth daily.     Daleen Bo, MD 10/06/16 [phone]

## 2016-10-06 NOTE — Discharge Instructions (Signed)
Try to eat foods which are both high in fiber and potassium.  Drink plenty of water each day.  See your cardiologist, tomorrow as scheduled, and discussed the low potassium, an episode of palpitation, today. They may adjust your potassium dosing.

## 2016-10-06 NOTE — ED Notes (Signed)
Papers reviewed and patient verbalizes understanding. Discussed her medications, follow up with cardiology, and foods that are rich in potassium

## 2016-10-06 NOTE — ED Triage Notes (Signed)
Pt. Here Co CP that started last night. Hy of A-fib and states she took flecanide last night and it helped some beut didn't relieve the pain. Pain is a 10 but doesn't radiate.

## 2016-10-07 ENCOUNTER — Encounter (HOSPITAL_COMMUNITY): Payer: Self-pay | Admitting: Nurse Practitioner

## 2016-10-07 ENCOUNTER — Ambulatory Visit (HOSPITAL_COMMUNITY)
Admission: RE | Admit: 2016-10-07 | Discharge: 2016-10-07 | Disposition: A | Discharge status: Home / Self Care | Payer: Medicare Other | Source: Ambulatory Visit | Attending: Nurse Practitioner | Admitting: Nurse Practitioner

## 2016-10-07 VITALS — BP 162/84 | HR 62 | Ht 67.0 in | Wt 130.6 lb

## 2016-10-07 DIAGNOSIS — Z5189 Encounter for other specified aftercare: Secondary | ICD-10-CM | POA: Diagnosis not present

## 2016-10-07 DIAGNOSIS — Z79899 Other long term (current) drug therapy: Secondary | ICD-10-CM | POA: Insufficient documentation

## 2016-10-07 DIAGNOSIS — Z87891 Personal history of nicotine dependence: Secondary | ICD-10-CM | POA: Insufficient documentation

## 2016-10-07 DIAGNOSIS — Z9889 Other specified postprocedural states: Secondary | ICD-10-CM | POA: Insufficient documentation

## 2016-10-07 DIAGNOSIS — E876 Hypokalemia: Secondary | ICD-10-CM | POA: Insufficient documentation

## 2016-10-07 DIAGNOSIS — I480 Paroxysmal atrial fibrillation: Secondary | ICD-10-CM | POA: Diagnosis not present

## 2016-10-07 DIAGNOSIS — I4891 Unspecified atrial fibrillation: Secondary | ICD-10-CM | POA: Insufficient documentation

## 2016-10-07 NOTE — Telephone Encounter (Signed)
Called pt - no answer; left message to give us a call back. 

## 2016-10-07 NOTE — Telephone Encounter (Signed)
Pt stated her concern has been "taken care of".
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
## 2016-10-14 ENCOUNTER — Ambulatory Visit (HOSPITAL_COMMUNITY)
Admission: RE | Admit: 2016-10-14 | Discharge: 2016-10-14 | Disposition: A | Discharge status: Home / Self Care | Payer: Medicare Other | Source: Ambulatory Visit | Attending: Nurse Practitioner | Admitting: Nurse Practitioner

## 2016-10-14 DIAGNOSIS — I481 Persistent atrial fibrillation: Secondary | ICD-10-CM | POA: Diagnosis not present

## 2016-10-14 LAB — BASIC METABOLIC PANEL
ANION GAP: 5 (ref 5–15)
BUN: 11 mg/dL (ref 6–20)
CHLORIDE: 106 mmol/L (ref 101–111)
CO2: 28 mmol/L (ref 22–32)
CREATININE: 0.71 mg/dL (ref 0.44–1.00)
Calcium: 9.5 mg/dL (ref 8.9–10.3)
GLUCOSE: 112 mg/dL — AB (ref 65–99)
Potassium: 3.9 mmol/L (ref 3.5–5.1)
Sodium: 139 mmol/L (ref 135–145)

## 2016-10-15 ENCOUNTER — Telehealth: Payer: Self-pay | Admitting: Internal Medicine

## 2016-10-15 NOTE — Telephone Encounter (Signed)
Pt ask about a pill for her sinuses, read back and ask if she was using allegra that dr sent in, she states no, but she will pick it up today and start using it, she will call next week if it does not help

## 2016-10-15 NOTE — Telephone Encounter (Signed)
Patient would like a call back about her sinuses and her medication

## 2016-10-16 IMAGING — DX DG CHEST 2V
2 series · 2 of 2 positions shown · non-contrast
Comparison: Chest x-ray dated 06/11/2015.

CLINICAL DATA: Atrial fibrillation. Rapid heart rate. Hypertension.

EXAM:
CHEST  2 VIEW

[chest pa]
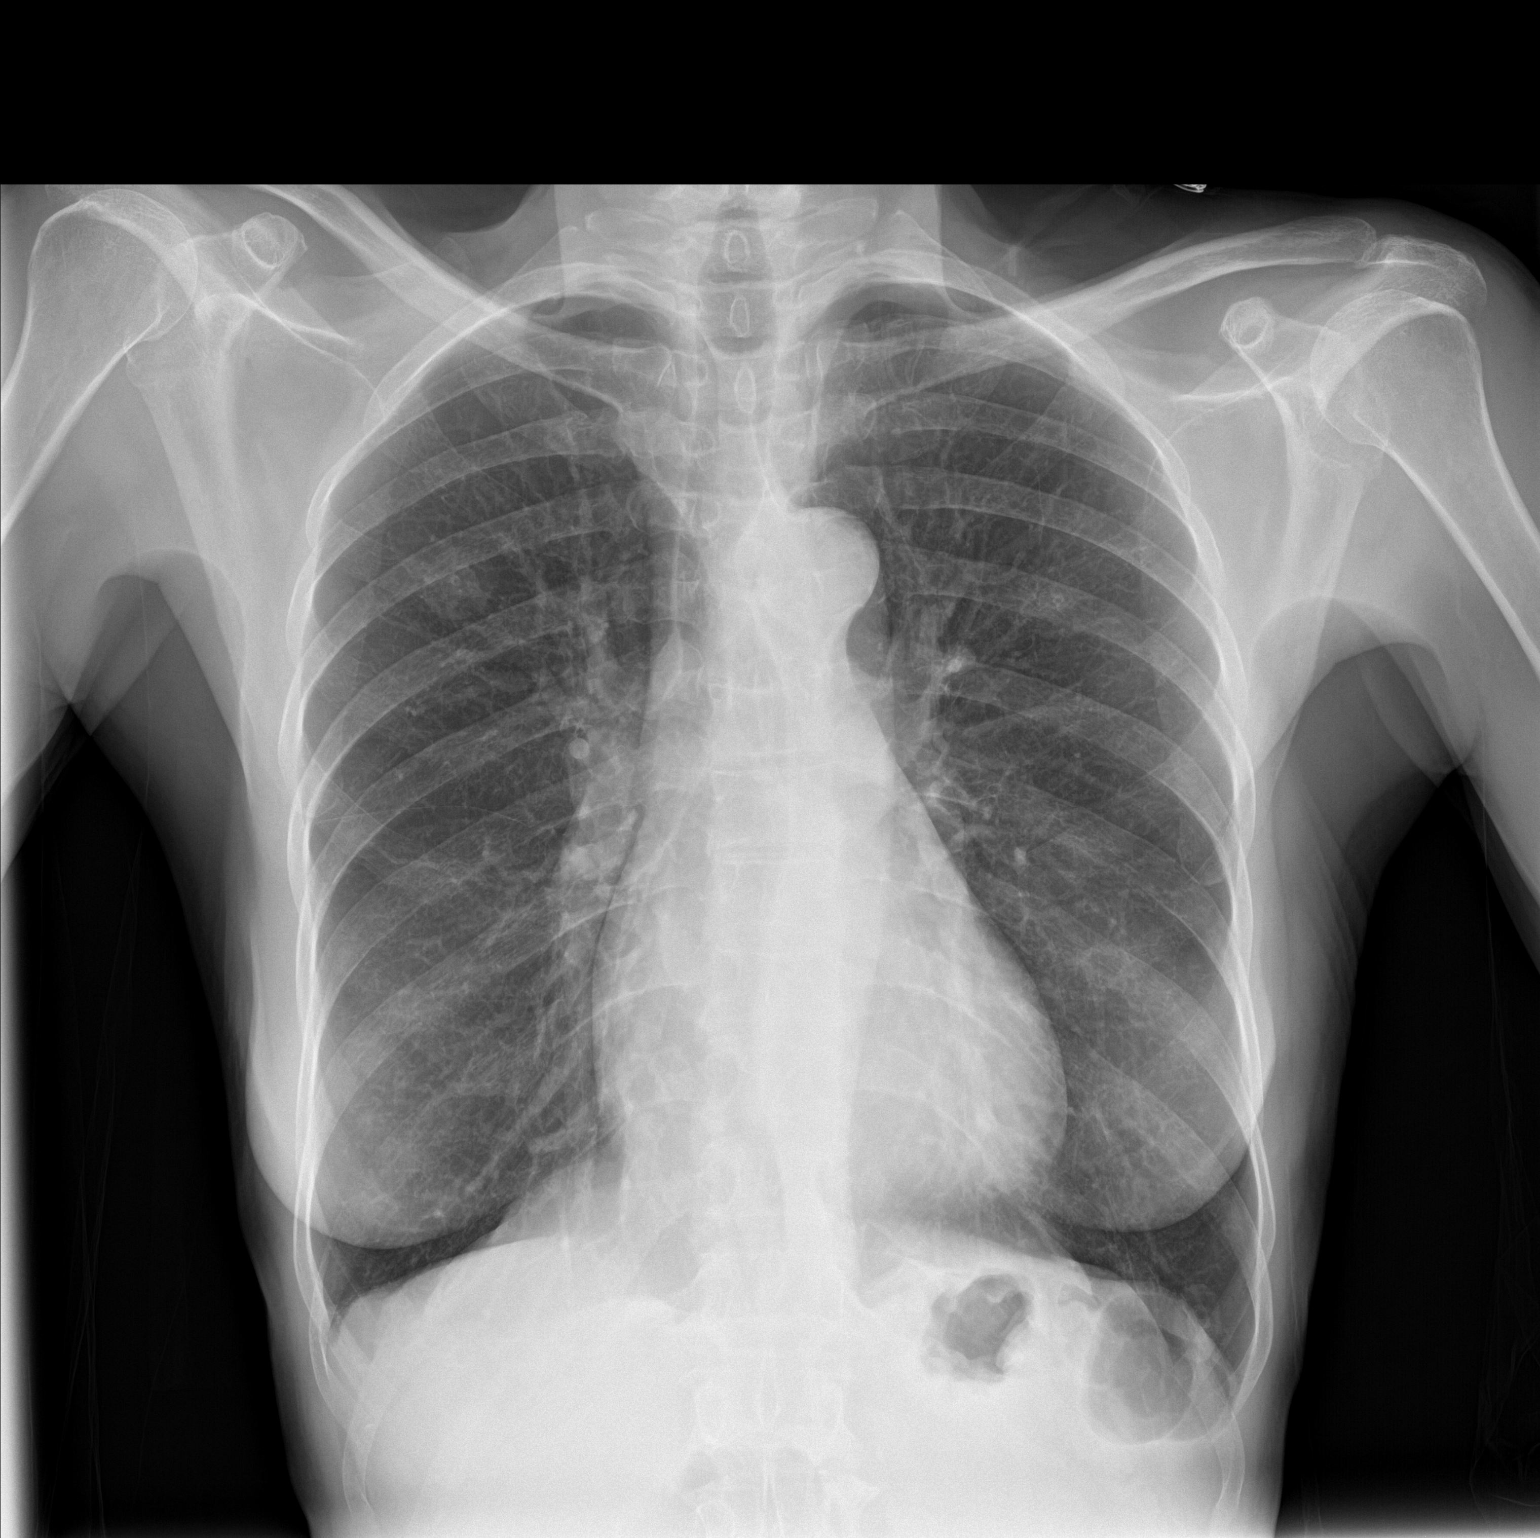

[chest lat]
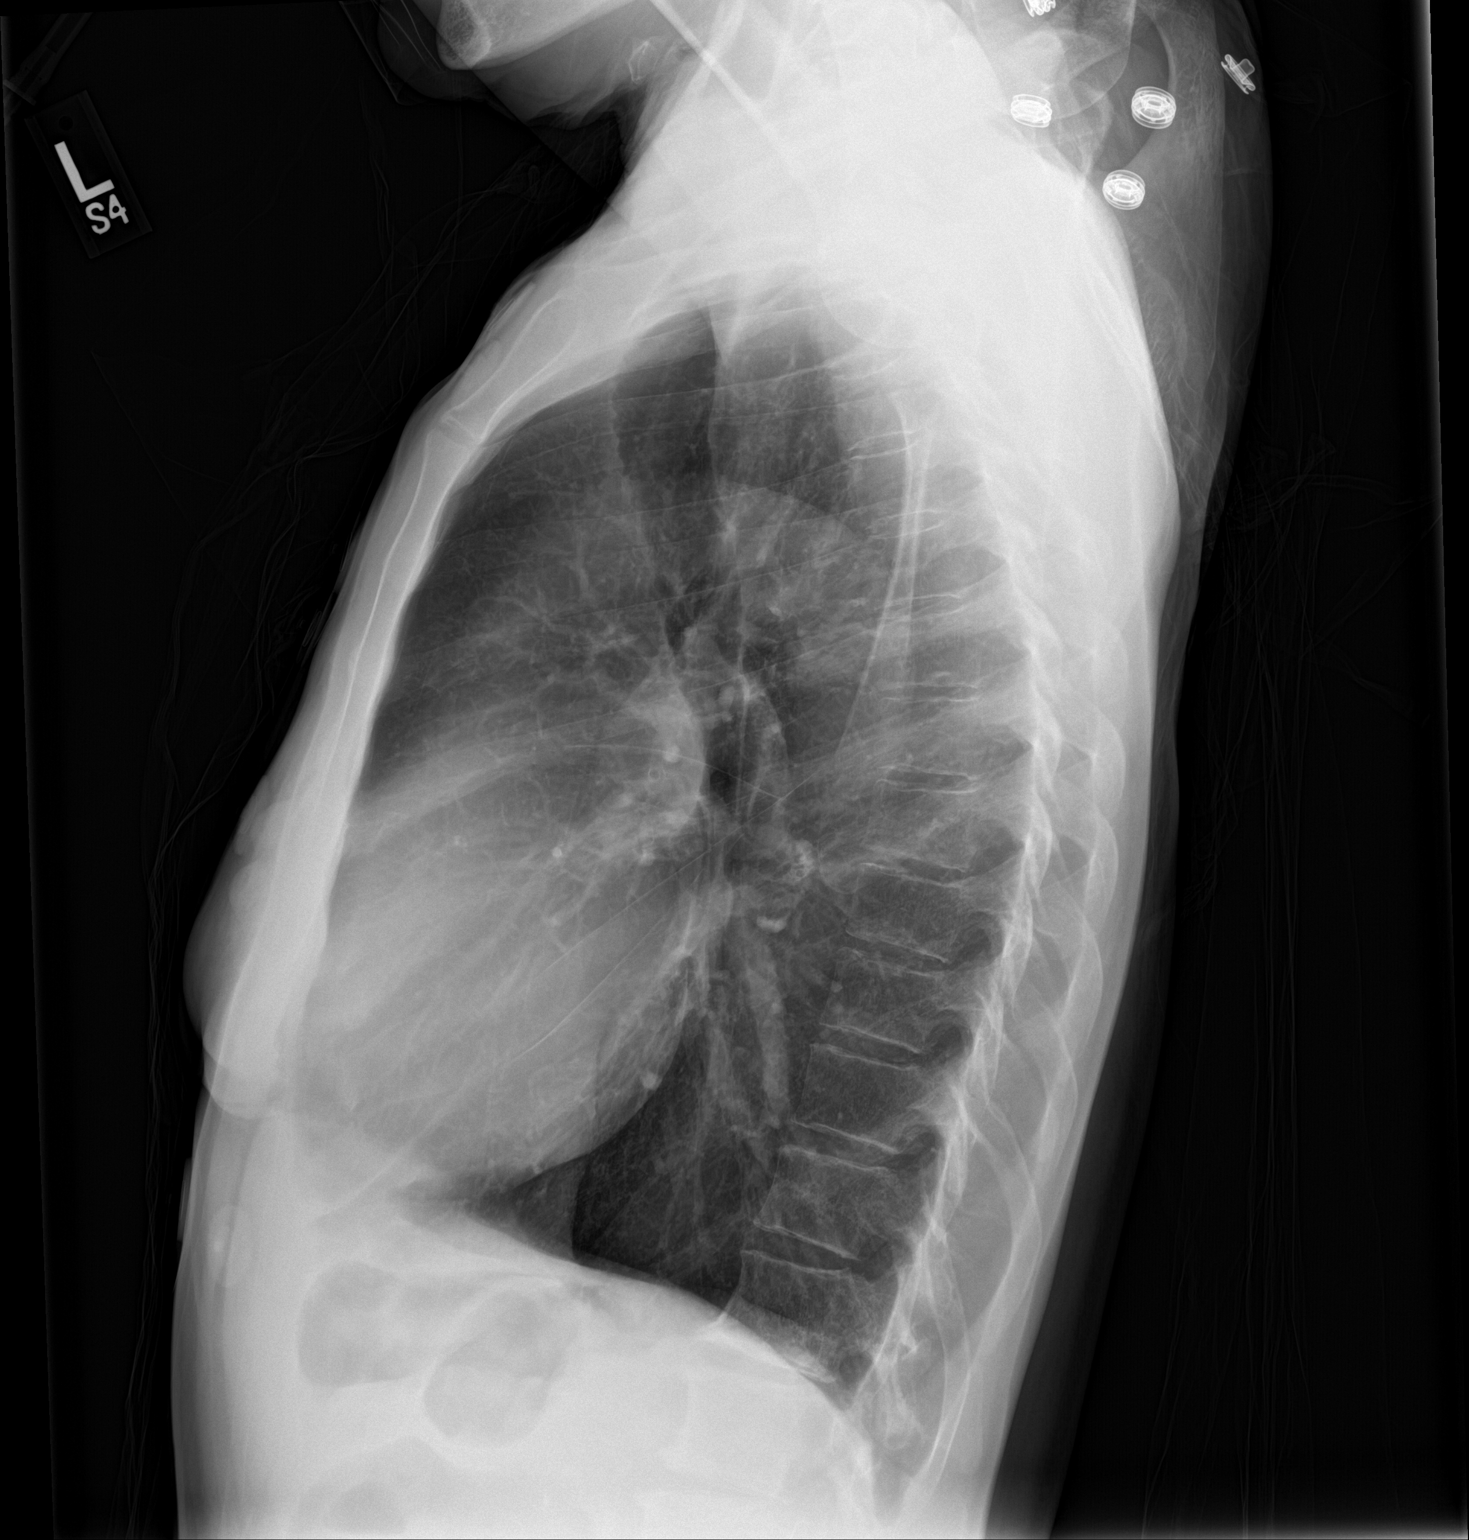

[2 of 2 positions shown; findings below may reference images not displayed]

FINDINGS: Heart size is upper normal, unchanged. Overall cardiomediastinal
silhouette is stable in size and configuration. Lungs are
hyperexpanded suggesting COPD. Lungs are clear. No acute osseous
abnormality.
IMPRESSION: Hyperexpanded lungs suggesting COPD.

No evidence of acute cardiopulmonary abnormality.

## 2016-10-19 ENCOUNTER — Ambulatory Visit (INDEPENDENT_AMBULATORY_CARE_PROVIDER_SITE_OTHER): Payer: Medicare Other | Admitting: Gastroenterology

## 2016-10-19 ENCOUNTER — Telehealth: Payer: Self-pay | Admitting: Emergency Medicine

## 2016-10-19 ENCOUNTER — Encounter: Payer: Self-pay | Admitting: Gastroenterology

## 2016-10-19 VITALS — BP 136/74 | HR 72 | Ht 67.0 in | Wt 134.4 lb

## 2016-10-19 DIAGNOSIS — Z7901 Long term (current) use of anticoagulants: Secondary | ICD-10-CM

## 2016-10-19 DIAGNOSIS — K219 Gastro-esophageal reflux disease without esophagitis: Secondary | ICD-10-CM

## 2016-10-19 DIAGNOSIS — D649 Anemia, unspecified: Secondary | ICD-10-CM | POA: Diagnosis not present

## 2016-10-19 DIAGNOSIS — Z1211 Encounter for screening for malignant neoplasm of colon: Secondary | ICD-10-CM

## 2016-10-19 DIAGNOSIS — K5900 Constipation, unspecified: Secondary | ICD-10-CM

## 2016-10-19 DIAGNOSIS — I480 Paroxysmal atrial fibrillation: Secondary | ICD-10-CM

## 2016-10-19 DIAGNOSIS — K5909 Other constipation: Secondary | ICD-10-CM | POA: Insufficient documentation

## 2016-10-19 DIAGNOSIS — K625 Hemorrhage of anus and rectum: Secondary | ICD-10-CM

## 2016-10-19 DIAGNOSIS — K5904 Chronic idiopathic constipation: Secondary | ICD-10-CM | POA: Insufficient documentation

## 2016-10-19 MED ORDER — NA SULFATE-K SULFATE-MG SULF 17.5-3.13-1.6 GM/177ML PO SOLN: 1.0000 | ORAL | 0 refills | Status: DC

## 2016-10-19 NOTE — Progress Notes (Signed)
Agree with assessment and plan as outlined.  

## 2016-10-19 NOTE — Patient Instructions (Addendum)
You have been scheduled for a colonoscopy. Please follow written instructions given to you at your visit today.  Please pick up your prep supplies at the pharmacy within the next 1-3 days. If you use inhalers (even only as needed), please bring them with you on the day of your procedure. Your physician has requested that you go to www.startemmi.com and enter the access code given to you at your visit today. This web site gives a general overview about your procedure. However, you should still follow specific instructions given to you by our office regarding your preparation for the procedure.   You will be contacted by our office prior to your procedure for directions on holding your Xarelto.  If you do not hear from our office 1 week prior to your scheduled procedure, please call [phone] to discuss.   

## 2016-10-19 NOTE — Telephone Encounter (Signed)
10/19/2016   RE: [name] DOB: 08-15-1951 MRN: [id number]   Dear Roderic Palau, NP,    We have scheduled the above patient for an endoscopic procedure. Our records show that she is on anticoagulation therapy.   Please advise as to how long the patient may come off her therapy of Xarelto prior to the procedure, which is scheduled for 12-14-2016.  Please fax back/ or route the completed form to [facility].   Sincerely,    Tinnie Gens, [place]

## 2016-10-19 NOTE — Telephone Encounter (Signed)
Spoke to patient and she verbalized understanding.

## 2016-10-19 NOTE — Progress Notes (Signed)
10/19/2016 [name] [id number] [birth year]   HISTORY OF PRESENT ILLNESS:  This is a 66 year old female who is new to our office and referred here by her PCP, Dr. Reesa Chew, to discuss colonoscopy.  She had never had a colonoscopy in the past.  She says that she has some constipation, but has been taking Miralax every other day and that is helping a lot.  Saw a small amount of light red blood/pink on the toilet paper at Christmas time but none since then.  She says that she thinks the blood was from straining.  Is on Xarelto for atrial fibrillation.  Sees Dr. Vernelle Emerald for cardiology.    Also says that she has some acid reflux on occasion as well but has been taking ranitidine 150 mg BID and that has helped with that.  Of note, Hgb 11.0 grams, down from 13.1 grams 2 weeks ago.  MCV normal.  Past Medical History:  Diagnosis Date  . A-fib ([place])   . Asthma   . Chronic anticoagulation   . GERD (gastroesophageal reflux disease)   . Hypertension   . PAF (paroxysmal atrial fibrillation) ([place]) 11/26/2015   April/May 2017: Multiple ED visits for A. Fib.  02/12/16: Improved on diltiazem IV. Cardiology consulted in the ED and discharge patient with diltiazem XR 120 mg once daily with 60 mg by mouth every 6 hours as needed for symptomatic palpitations, flecainide 50 mg twice daily. Referred to the atrial fibrillation clinic.  02/17/16: Seen in the atrial fibrillation clinic by NP Roderic Palau. Normal sin   Past Surgical History:  Procedure Laterality Date  . TONSILLECTOMY    . TUBAL LIGATION      reports that she quit smoking about a year ago. Her smoking use included Cigarettes. She has never used smokeless tobacco. She reports that she does not drink alcohol or use drugs. family history includes Breast cancer in her sister; Cancer in her sister, sister, and sister; Heart attack in her mother; Lung cancer in her brother. Allergies  Allergen Reactions  . Protonix [Pantoprazole Sodium]  Other (See Comments)    Heart races  . Celecoxib Rash      Outpatient Encounter Prescriptions as of 10/19/2016  Medication Sig  . atorvastatin (LIPITOR) 40 MG tablet Take 1 tablet (40 mg total) by mouth daily.  . Cetirizine HCl 10 MG CAPS Take 1 capsule (10 mg total) by mouth every morning.  . diltiazem (CARDIZEM CD) 120 MG 24 hr capsule Take 1 capsule (120 mg total) by mouth daily.  Marland Kitchen diltiazem (CARDIZEM) 60 MG tablet 1 tablet by mouth every 6 hours AS NEEDED for sensation of rapid heartbeat.  . flecainide (TAMBOCOR) 100 MG tablet Take 1 tablet (100 mg total) by mouth 2 (two) times daily.  Marland Kitchen losartan (COZAAR) 25 MG tablet Take 1 tablet (25 mg total) by mouth daily.  . polyethylene glycol (MIRALAX / GLYCOLAX) packet Take 17 g by mouth daily. For Constipation  . potassium chloride SA (K-DUR,KLOR-CON) 20 MEQ tablet Take 1 tablet (20 mEq total) by mouth daily.  . ranitidine (ZANTAC) 150 MG tablet Take 1 tablet (150 mg total) by mouth 2 (two) times daily.  . rivaroxaban (XARELTO) 20 MG TABS tablet Take 1 tablet (20 mg total) by mouth daily with supper.  . tiotropium (SPIRIVA HANDIHALER) 18 MCG inhalation capsule INHALE THE CONTENTS OF 1 CAPSULE(18 MCG) VIA INHALATION DEVICE EVERY DAY  . [DISCONTINUED] albuterol (PROVENTIL HFA;VENTOLIN HFA) 108 (90 Base) MCG/ACT inhaler Inhale  2 puffs into the lungs every 6 (six) hours as needed for wheezing or shortness of breath.  . [DISCONTINUED] albuterol (PROVENTIL) (2.5 MG/3ML) 0.083% nebulizer solution Take 3 mLs (2.5 mg total) by nebulization every 6 (six) hours as needed for wheezing or shortness of breath.  . [DISCONTINUED] azelastine (ASTELIN) 0.1 % nasal spray Place 2 sprays into both nostrils 2 (two) times daily. Use in each nostril as directed  . [DISCONTINUED] carbamide peroxide (DEBROX) 6.5 % otic solution Place 5 drops into both ears 2 (two) times daily.  . [DISCONTINUED] fexofenadine (ALLEGRA ALLERGY) 180 MG tablet Take 1 tablet (180 mg total) by  mouth daily. (Patient not taking: Reported on 10/19/2016)  . [DISCONTINUED] fluticasone (FLONASE) 50 MCG/ACT nasal spray Place 2 sprays into both nostrils daily.  . [DISCONTINUED] Skin Protectants, Misc. (EUCERIN) cream Apply topically as needed for wound care.  . [DISCONTINUED] triamcinolone (KENALOG) 0.025 % ointment Apply topically 2 (two) times daily. As needed for rash. Apply to lower leg for the next 4 weeks. (Patient taking differently: Apply 1 application topically 2 (two) times daily as needed (for rash on lower leg). )   Facility-Administered Encounter Medications as of 10/19/2016  Medication  . triamcinolone (KENALOG) 0.025 % cream     REVIEW OF SYSTEMS  : All other systems reviewed and negative except where noted in the History of Present Illness.   PHYSICAL EXAM: BP 136/74   Pulse 72   Ht 5\' 7"  (1.702 m)   Wt 134 lb 6.4 oz (61 kg)   BMI 21.05 kg/m  General: Well developed black female in no acute distress Head: Normocephalic and atraumatic Eyes:  Sclerae anicteric, conjunctiva pink. Ears: Normal auditory acuity Lungs: Clear throughout to auscultation Heart: Regular rate and rhythm Abdomen: Soft, non-distended.  Normal bowel sounds.  Non-tender. Rectal:  Will be done at the time of colonoscopy. Musculoskeletal: Symmetrical with no gross deformities  Skin: No lesions on visible extremities Extremities: No edema  Neurological: Alert oriented x 4, grossly non-focal Psychological:  Alert and cooperative. Normal mood and affect  ASSESSMENT AND PLAN: -Screening colonoscopy:  Never had one.  Will schedule with Dr. Havery Moros. -Constipation:  Doing well on Miralax every other day. -Rectal bleeding:  One episode a couple of months ago.  Very small amount. -Normocytic anemia:  Hgb 11.0 grams, down from 13.1 grams 2 weeks ago.  MCV normal. -GERD:  Well controlled on zantac 150 mg BID. -Chronic anticoagulation with Xarelto for atrial fibrillation:  Will hold for 2 days prior to  endoscopic procedures - will instruct when and how to resume after procedure. Benefits and risks of procedure explained including risks of bleeding, perforation, infection, missed lesions, reactions to medications and possible need for hospitalization and surgery for complications. Additional rare but real risk of stroke or other vascular clotting events off of Xarelto also explained and need to seek urgent help if any signs of these problems occur. Will communicate by phone or EMR with patient's prescribing provider, Dr. Vernelle Emerald, to confirm that holding Xarelto is reasonable in this case.   CC:  [name], MD

## 2016-10-20 ENCOUNTER — Telehealth: Payer: Self-pay | Admitting: Internal Medicine

## 2016-10-20 ENCOUNTER — Telehealth: Payer: Self-pay | Admitting: Gastroenterology

## 2016-10-20 MED ORDER — POLYETHYLENE GLYCOL 3350 17 G PO PACK: 17.0000 g | PACK | Freq: Two times a day (BID) | ORAL | 0 refills | Status: DC

## 2016-10-20 NOTE — Telephone Encounter (Signed)
Pt was advised to increase miralax to twice daily and a refill has been sent.  She will keep scheduled colon with Dr Havery Moros

## 2016-10-20 NOTE — Telephone Encounter (Signed)
Would like to talk to nurse. Unable to use bathroom

## 2016-10-20 NOTE — Telephone Encounter (Signed)
Pt was in [place] yesterday, today she states she does not think the mirilax is working the way it should, she also c/o gas, referred to gi, gave pt ph# to call PA she saw 2/27 at [facility].

## 2016-10-25 ENCOUNTER — Other Ambulatory Visit: Payer: Self-pay | Admitting: *Deleted

## 2016-10-25 DIAGNOSIS — I10 Essential (primary) hypertension: Secondary | ICD-10-CM

## 2016-10-27 MED ORDER — LOSARTAN POTASSIUM 25 MG PO TABS: 25.0000 mg | ORAL_TABLET | Freq: Every day | ORAL | 4 refills | Status: DC

## 2016-11-03 ENCOUNTER — Other Ambulatory Visit: Payer: Self-pay | Admitting: Internal Medicine

## 2016-11-03 NOTE — Telephone Encounter (Signed)
Wants a refill of cream for eczema

## 2016-11-03 NOTE — Telephone Encounter (Signed)
Wants to change potassium chloride SA (K-DUR,KLOR-CON) 20 MEQ tablet  [facility]

## 2016-11-04 MED ORDER — POTASSIUM CHLORIDE CRYS ER 20 MEQ PO TBCR: 20.0000 meq | EXTENDED_RELEASE_TABLET | Freq: Every day | ORAL | 3 refills | Status: DC

## 2016-11-08 ENCOUNTER — Telehealth: Payer: Self-pay | Admitting: Gastroenterology

## 2016-11-08 ENCOUNTER — Other Ambulatory Visit (HOSPITAL_COMMUNITY): Payer: Self-pay | Admitting: *Deleted

## 2016-11-08 ENCOUNTER — Other Ambulatory Visit: Payer: Self-pay | Admitting: Internal Medicine

## 2016-11-08 MED ORDER — POTASSIUM CHLORIDE CRYS ER 20 MEQ PO TBCR: 20.0000 meq | EXTENDED_RELEASE_TABLET | Freq: Every day | ORAL | 6 refills | Status: DC

## 2016-11-08 NOTE — Telephone Encounter (Signed)
Patient has been taking her Miralax despite multiple diarrhea episodes a day.  We discussed how to titrate Miralax .  She will stop for a few days and then titrate Miralax for effect.  She will call back for any additional questions or concerns.

## 2016-11-08 NOTE — Telephone Encounter (Signed)
potassium chloride SA (K-DUR,KLOR-CON) 20 MEQ tablet [facility]

## 2016-11-08 NOTE — Telephone Encounter (Signed)
This was filled and [facility] has the script by a donna carroll in another providers office, pt informed

## 2016-11-12 ENCOUNTER — Other Ambulatory Visit (HOSPITAL_COMMUNITY): Payer: Self-pay | Admitting: *Deleted

## 2016-11-12 MED ORDER — POTASSIUM CHLORIDE CRYS ER 10 MEQ PO TBCR: 20.0000 meq | EXTENDED_RELEASE_TABLET | Freq: Every day | ORAL | 3 refills | Status: DC

## 2016-11-18 ENCOUNTER — Telehealth (HOSPITAL_COMMUNITY): Payer: Self-pay | Admitting: *Deleted

## 2016-11-18 ENCOUNTER — Telehealth: Payer: Self-pay

## 2016-11-18 NOTE — Telephone Encounter (Signed)
Patient called stating Spiriva causes her to go into Afib and wants her asthma medication changed. Per Butch Penny I advised pt to call her PCP since they manage her asthma. Patient agreeable.

## 2016-11-18 NOTE — Telephone Encounter (Signed)
I called the patient and she said that on the current inhaler is Spiriva, and she thinks that is also causing her to go in A. Fib. She used to take albuterol and Pulmicort and according to patient somebody d/c them. Her asthma is fairly under control. I explained to her that Spiriva is less likely causing her to go in A. Fib. But if she thinks so, she should not use that and come to the clinic to be reassessed. She agreed with the plan.

## 2016-11-18 NOTE — Telephone Encounter (Signed)
Pt is calling back to speak with the nurse about meds. Please call back.

## 2016-11-18 NOTE — Telephone Encounter (Signed)
Needs to speak with a nurse about meds. Please call pt back.  

## 2016-11-18 NOTE — Telephone Encounter (Signed)
Pt calls and states that all her inhalers cause her to go into AFIB and she cannot use them, she would like something else in their place, advised an appt but she would prefer you just change them to something else for her asthma. Please advise

## 2016-11-18 NOTE — Telephone Encounter (Signed)
Pt is calling back to speak with a nurse, please call back.

## 2016-11-22 ENCOUNTER — Telehealth: Payer: Self-pay

## 2016-11-30 ENCOUNTER — Encounter (HOSPITAL_COMMUNITY): Payer: Self-pay | Admitting: Nurse Practitioner

## 2016-11-30 ENCOUNTER — Ambulatory Visit (HOSPITAL_COMMUNITY)
Admission: RE | Admit: 2016-11-30 | Discharge: 2016-11-30 | Disposition: A | Discharge status: Home / Self Care | Payer: Medicare Other | Source: Ambulatory Visit | Attending: Nurse Practitioner | Admitting: Nurse Practitioner

## 2016-11-30 VITALS — BP 140/82 | HR 54 | Ht 67.0 in | Wt 141.0 lb

## 2016-11-30 DIAGNOSIS — I10 Essential (primary) hypertension: Secondary | ICD-10-CM | POA: Diagnosis not present

## 2016-11-30 DIAGNOSIS — Z87891 Personal history of nicotine dependence: Secondary | ICD-10-CM | POA: Insufficient documentation

## 2016-11-30 DIAGNOSIS — K219 Gastro-esophageal reflux disease without esophagitis: Secondary | ICD-10-CM | POA: Insufficient documentation

## 2016-11-30 DIAGNOSIS — E876 Hypokalemia: Secondary | ICD-10-CM | POA: Diagnosis not present

## 2016-11-30 DIAGNOSIS — I498 Other specified cardiac arrhythmias: Secondary | ICD-10-CM | POA: Insufficient documentation

## 2016-11-30 DIAGNOSIS — R001 Bradycardia, unspecified: Secondary | ICD-10-CM | POA: Diagnosis not present

## 2016-11-30 DIAGNOSIS — I480 Paroxysmal atrial fibrillation: Secondary | ICD-10-CM

## 2016-11-30 DIAGNOSIS — Z7901 Long term (current) use of anticoagulants: Secondary | ICD-10-CM | POA: Insufficient documentation

## 2016-11-30 DIAGNOSIS — I4891 Unspecified atrial fibrillation: Secondary | ICD-10-CM | POA: Diagnosis not present

## 2016-11-30 DIAGNOSIS — J45909 Unspecified asthma, uncomplicated: Secondary | ICD-10-CM | POA: Insufficient documentation
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
## 2016-12-08 ENCOUNTER — Other Ambulatory Visit: Payer: Self-pay | Admitting: *Deleted

## 2016-12-08 DIAGNOSIS — I10 Essential (primary) hypertension: Secondary | ICD-10-CM

## 2016-12-08 MED ORDER — LOSARTAN POTASSIUM 25 MG PO TABS: 25.0000 mg | ORAL_TABLET | Freq: Every day | ORAL | 3 refills | Status: DC

## 2016-12-08 NOTE — Telephone Encounter (Signed)
Pt has changed from [facility] to [facility] ([place]). New rx sent (transferred) to [facility]. Marland KitchenRegenia Skeeter, Darlene Cassady4/18/20184:24 PM

## 2016-12-10 ENCOUNTER — Other Ambulatory Visit: Payer: Self-pay

## 2016-12-10 NOTE — Telephone Encounter (Signed)
losartan (COZAAR) 25 MG tablet, is not at the pharmacy. Please call pt back.

## 2016-12-10 NOTE — Telephone Encounter (Signed)
Called pharm and pt, her medication is now straightened out, she will not use [facility] in the future, it is now removed from her profile

## 2016-12-16 ENCOUNTER — Encounter: Payer: Self-pay | Admitting: Gastroenterology

## 2016-12-16 ENCOUNTER — Ambulatory Visit (AMBULATORY_SURGERY_CENTER): Payer: Medicare Other | Admitting: Gastroenterology

## 2016-12-16 VITALS — BP 156/94 | HR 56 | Temp 95.5°F | Resp 27 | Ht 67.0 in | Wt 136.0 lb

## 2016-12-16 DIAGNOSIS — D125 Benign neoplasm of sigmoid colon: Secondary | ICD-10-CM | POA: Diagnosis not present

## 2016-12-16 DIAGNOSIS — Z1211 Encounter for screening for malignant neoplasm of colon: Secondary | ICD-10-CM

## 2016-12-16 DIAGNOSIS — D120 Benign neoplasm of cecum: Secondary | ICD-10-CM

## 2016-12-16 DIAGNOSIS — D123 Benign neoplasm of transverse colon: Secondary | ICD-10-CM

## 2016-12-16 DIAGNOSIS — K635 Polyp of colon: Secondary | ICD-10-CM

## 2016-12-16 DIAGNOSIS — Z1212 Encounter for screening for malignant neoplasm of rectum: Secondary | ICD-10-CM

## 2016-12-16 MED ORDER — SODIUM CHLORIDE 0.9 % IV SOLN: 500.0000 mL | INTRAVENOUS | Status: DC

## 2016-12-16 NOTE — Progress Notes (Signed)
No egg or soy allergy known to patient  No issues with past sedation with any surgeries  or procedures, no intubation problems  No diet pills per patient No home 02 use per patient  No blood thinners per patient  Pt denies issues with constipation  Hx  A fib- on xarelto- last dose Monday 12-13-16 l

## 2016-12-16 NOTE — Patient Instructions (Signed)
Discharge instructions given. Handouts on polyps and hemorrhoids. Resume Xarelto in 3 days. No ibuprofen, naproxen, or other NSAIDs for two weeks. Resume previous medications. YOU HAD AN ENDOSCOPIC PROCEDURE TODAY AT [facility]:   Refer to the procedure report that was given to you for any specific questions about what was found during the examination.  If the procedure report does not answer your questions, please call your gastroenterologist to clarify.  If you requested that your care partner not be given the details of your procedure findings, then the procedure report has been included in a sealed envelope for you to review at your convenience later.  YOU SHOULD EXPECT: Some feelings of bloating in the abdomen. Passage of more gas than usual.  Walking can help get rid of the air that was put into your GI tract during the procedure and reduce the bloating. If you had a lower endoscopy (such as a colonoscopy or flexible sigmoidoscopy) you may notice spotting of blood in your stool or on the toilet paper. If you underwent a bowel prep for your procedure, you may not have a normal bowel movement for a few days.  Please Note:  You might notice some irritation and congestion in your nose or some drainage.  This is from the oxygen used during your procedure.  There is no need for concern and it should clear up in a day or so.  SYMPTOMS TO REPORT IMMEDIATELY:   Following lower endoscopy (colonoscopy or flexible sigmoidoscopy):  Excessive amounts of blood in the stool  Significant tenderness or worsening of abdominal pains  Swelling of the abdomen that is new, acute  Fever of 100F or higher   For urgent or emergent issues, a gastroenterologist can be reached at any hour by calling [phone].   DIET:  We do recommend a small meal at first, but then you may proceed to your regular diet.  Drink plenty of fluids but you should avoid alcoholic beverages for 24  hours.  ACTIVITY:  You should plan to take it easy for the rest of today and you should NOT DRIVE or use heavy machinery until tomorrow (because of the sedation medicines used during the test).    FOLLOW UP: Our staff will call the number listed on your records the next business day following your procedure to check on you and address any questions or concerns that you may have regarding the information given to you following your procedure. If we do not reach you, we will leave a message.  However, if you are feeling well and you are not experiencing any problems, there is no need to return our call.  We will assume that you have returned to your regular daily activities without incident.  If any biopsies were taken you will be contacted by phone or by letter within the next 1-3 weeks.  Please call us at [phone] if you have not heard about the biopsies in 3 weeks.    SIGNATURES/CONFIDENTIALITY: You and/or your care partner have signed paperwork which will be entered into your electronic medical record.  These signatures attest to the fact that that the information above on your After Visit Summary has been reviewed and is understood.  Full responsibility of the confidentiality of this discharge information lies with you and/or your care-partner.

## 2016-12-16 NOTE — Progress Notes (Signed)
Report given to PACU, vss 

## 2016-12-16 NOTE — Progress Notes (Signed)
Called to room to assist during endoscopic procedure.  Patient ID and intended procedure confirmed with present staff. Received instructions for my participation in the procedure from the performing physician.  

## 2016-12-16 NOTE — Op Note (Signed)
[facility] Patient Name: [name] Procedure Date: 12/16/2016 2:01 PM MRN: [id number] Endoscopist: Remo Lipps P. Armbruster MD, MD Age: 66 Referring MD:  Date of Birth: Sep 05, 1950 Gender: Female Account #: [id number] Procedure:                Colonoscopy Indications:              Screening for colorectal malignant neoplasm, This                            is the patient's first colonoscopy Medicines:                Monitored Anesthesia Care Procedure:                Pre-Anesthesia Assessment:                           - Prior to the procedure, a History and Physical                            was performed, and patient medications and                            allergies were reviewed. The patient's tolerance of                            previous anesthesia was also reviewed. The risks                            and benefits of the procedure and the sedation                            options and risks were discussed with the patient.                            All questions were answered, and informed consent                            was obtained. Prior Anticoagulants: The patient has                            taken Xarelto (rivaroxaban), last dose was 2 days                            prior to procedure. ASA Grade Assessment: III - A                            patient with severe systemic disease. After                            reviewing the risks and benefits, the patient was                            deemed in satisfactory condition to undergo the  procedure.                           After obtaining informed consent, the colonoscope                            was passed under direct vision. Throughout the                            procedure, the patient's blood pressure, pulse, and                            oxygen saturations were monitored continuously. The                            Colonoscope was introduced through the anus and                           advanced to the the cecum, identified by                            appendiceal orifice and ileocecal valve. The                            colonoscopy was performed without difficulty. The                            patient tolerated the procedure well. The quality                            of the bowel preparation was good. The ileocecal                            valve, appendiceal orifice, and rectum were                            photographed. Scope In: 2:16:16 PM Scope Out: 2:33:14 PM Scope Withdrawal Time: 0 hours 13 minutes 10 seconds  Total Procedure Duration: 0 hours 16 minutes 58 seconds  Findings:                 The perianal and digital rectal examinations were                            normal.                           The colon was tortuous.                           A 3 mm polyp was found in the cecum. The polyp was                            sessile. The polyp was removed with a cold snare.  Resection and retrieval were complete.                           A 5 mm polyp was found in the transverse colon. The                            polyp was sessile. The polyp was removed with a                            cold snare. Resection and retrieval were complete.                           A 3 mm polyp was found in the sigmoid colon. The                            polyp was sessile. The polyp was removed with a                            cold snare. Resection and retrieval were complete.                           Internal hemorrhoids were found during retroflexion.                           The exam was otherwise without abnormality. Complications:            No immediate complications. Estimated blood loss:                            Minimal. Estimated Blood Loss:     Estimated blood loss was minimal. Impression:               - Tortuous colon.                           - One 3 mm polyp in the cecum, removed with a cold                             snare. Resected and retrieved.                           - One 5 mm polyp in the transverse colon, removed                            with a cold snare. Resected and retrieved.                           - One 3 mm polyp in the sigmoid colon, removed with                            a cold snare. Resected and retrieved.                           - Internal hemorrhoids.                           -  The examination was otherwise normal. Recommendation:           - Patient has a contact number available for                            emergencies. The signs and symptoms of potential                            delayed complications were discussed with the                            patient. Return to normal activities tomorrow.                            Written discharge instructions were provided to the                            patient.                           - Resume previous diet.                           - Continue present medications.                           - Resume Xarelto in 3 days                           - Await pathology results.                           - Repeat colonoscopy is recommended for                            surveillance. The colonoscopy date will be                            determined after pathology results from today's                            exam become available for review.                           - No ibuprofen, naproxen, or other non-steroidal                            anti-inflammatory drugs for 2 weeks after polyp                            removal. Remo Lipps P. Armbruster MD, MD 12/16/2016 2:37:10 PM This report has been signed electronically.

## 2016-12-17 ENCOUNTER — Telehealth: Payer: Self-pay | Admitting: *Deleted

## 2016-12-17 ENCOUNTER — Telehealth: Payer: Self-pay

## 2016-12-17 ENCOUNTER — Other Ambulatory Visit (HOSPITAL_COMMUNITY): Payer: Self-pay | Admitting: Nurse Practitioner

## 2016-12-17 NOTE — Telephone Encounter (Signed)
uable to reach pt. Or leave message.

## 2016-12-17 NOTE — Telephone Encounter (Signed)
Attempted to reach pt. With follow up call following endoscopic procedure yesterday.  LM on pt. Ans. Machine.   Will try to reach pt. Later today.

## 2016-12-17 NOTE — Telephone Encounter (Signed)
Patient returning phone call states she is doing fine and grateful for the follow up call.

## 2016-12-21 ENCOUNTER — Encounter: Payer: Self-pay | Admitting: Gastroenterology

## 2017-01-11 ENCOUNTER — Telehealth: Payer: Self-pay | Admitting: Gastroenterology

## 2017-01-11 NOTE — Telephone Encounter (Signed)
Left patient a message to contact her primary care physician for the low back pain. Let her know it's okay to take the Tylenol as needed. If she has any further questions or concerns asked her to please call back.

## 2017-01-11 NOTE — Telephone Encounter (Signed)
Procedure reviewed. I would think it unusual for her back pain to be related to the procedure itself. Perhaps positioning during the procedure could have been related but she was just lying on her left side, again seems less likely. Agree with tylenol as needed, I think she should follow up with her primary care for back pain. I did not see anything on colonoscopy to account for this.

## 2017-01-11 NOTE — Telephone Encounter (Signed)
Patient states that since colonoscopy she has been having lower back pain radiating down her buttock, also gas, denies abdominal pain. She states she has been having regular bowel movements, helped by taking Miralax. She has tried using OTC GasX, she says this helps a little. She has tried Tylenol for her back pain, states it helps some, she has not contacted her primary care physician for this.

## 2017-01-19 ENCOUNTER — Telehealth: Payer: Self-pay | Admitting: Internal Medicine

## 2017-01-19 NOTE — Telephone Encounter (Signed)
ERROR

## 2017-01-24 ENCOUNTER — Other Ambulatory Visit: Payer: Self-pay

## 2017-01-24 ENCOUNTER — Other Ambulatory Visit: Payer: Self-pay | Admitting: Gastroenterology

## 2017-01-24 MED ORDER — POLYETHYLENE GLYCOL 3350 17 GM/SCOOP PO POWD: ORAL | 1 refills | Status: DC

## 2017-01-24 NOTE — Telephone Encounter (Signed)
Okay, thanks

## 2017-01-24 NOTE — Telephone Encounter (Signed)
Miralax sent to [facility].

## 2017-02-07 ENCOUNTER — Emergency Department (HOSPITAL_COMMUNITY)
Admission: EM | Admit: 2017-02-07 | Discharge: 2017-02-07 | Disposition: A | Discharge status: Home / Self Care | Payer: Medicare Other | Attending: Emergency Medicine | Admitting: Emergency Medicine

## 2017-02-07 ENCOUNTER — Encounter (HOSPITAL_COMMUNITY): Payer: Self-pay

## 2017-02-07 ENCOUNTER — Emergency Department (HOSPITAL_COMMUNITY): Payer: Medicare Other

## 2017-02-07 DIAGNOSIS — R0602 Shortness of breath: Secondary | ICD-10-CM

## 2017-02-07 DIAGNOSIS — E876 Hypokalemia: Secondary | ICD-10-CM | POA: Insufficient documentation

## 2017-02-07 DIAGNOSIS — I10 Essential (primary) hypertension: Secondary | ICD-10-CM | POA: Insufficient documentation

## 2017-02-07 DIAGNOSIS — Z79899 Other long term (current) drug therapy: Secondary | ICD-10-CM | POA: Insufficient documentation

## 2017-02-07 DIAGNOSIS — J45909 Unspecified asthma, uncomplicated: Secondary | ICD-10-CM | POA: Diagnosis not present

## 2017-02-07 DIAGNOSIS — Z87891 Personal history of nicotine dependence: Secondary | ICD-10-CM | POA: Insufficient documentation

## 2017-02-07 DIAGNOSIS — Z7901 Long term (current) use of anticoagulants: Secondary | ICD-10-CM | POA: Diagnosis not present

## 2017-02-07 DIAGNOSIS — I4891 Unspecified atrial fibrillation: Secondary | ICD-10-CM | POA: Diagnosis not present

## 2017-02-07 LAB — CBC
HCT: 40.9 % (ref 36.0–46.0)
HEMOGLOBIN: 13.4 g/dL (ref 12.0–15.0)
MCH: 26.9 pg (ref 26.0–34.0)
MCHC: 32.8 g/dL (ref 30.0–36.0)
MCV: 82.1 fL (ref 78.0–100.0)
PLATELETS: 235 10*3/uL (ref 150–400)
RBC: 4.98 MIL/uL (ref 3.87–5.11)
RDW: 14.4 % (ref 11.5–15.5)
WBC: 3.6 10*3/uL — AB (ref 4.0–10.5)

## 2017-02-07 LAB — BASIC METABOLIC PANEL
ANION GAP: 7 (ref 5–15)
BUN: 11 mg/dL (ref 6–20)
CHLORIDE: 105 mmol/L (ref 101–111)
CO2: 27 mmol/L (ref 22–32)
CREATININE: 0.78 mg/dL (ref 0.44–1.00)
Calcium: 9.7 mg/dL (ref 8.9–10.3)
GFR calc non Af Amer: >60 mL/min (ref >60)
Glucose, Bld: 116 mg/dL — ABNORMAL HIGH (ref 65–99)
POTASSIUM: 3.3 mmol/L — AB (ref 3.5–5.1)
SODIUM: 139 mmol/L (ref 135–145)

## 2017-02-07 LAB — I-STAT TROPONIN, ED: Troponin i, poc: 0 ng/mL (ref 0.00–0.08)

## 2017-02-07 MED ORDER — DILTIAZEM LOAD VIA INFUSION
20.0000 mg | Freq: Once | INTRAVENOUS | Status: AC
  Administered 2017-02-07: 20 mg via INTRAVENOUS
  Filled 2017-02-07: qty 20

## 2017-02-07 MED ORDER — ACETAMINOPHEN 500 MG PO TABS
1000.0000 mg | ORAL_TABLET | Freq: Once | ORAL | Status: AC
  Administered 2017-02-07: 1000 mg via ORAL
  Filled 2017-02-07: qty 2

## 2017-02-07 MED ORDER — DILTIAZEM HCL 100 MG IV SOLR
5.0000 mg/h | INTRAVENOUS | Status: DC
  Administered 2017-02-07: 5 mg/h via INTRAVENOUS
  Filled 2017-02-07: qty 100

## 2017-02-07 MED ORDER — POTASSIUM CHLORIDE CRYS ER 20 MEQ PO TBCR
40.0000 meq | EXTENDED_RELEASE_TABLET | Freq: Once | ORAL | Status: AC
Start: 2017-02-07 — End: 2017-02-07
  Administered 2017-02-07: 40 meq via ORAL
  Filled 2017-02-07: qty 2

## 2017-02-07 NOTE — ED Triage Notes (Signed)
Patient complains of shortness of breath and thinks she is back in atrial fib, hx of same. Reports that this systems started when she awoke this am. On arrival alert and oriented.

## 2017-02-07 NOTE — ED Provider Notes (Signed)
[facility] DEPT Provider Note   CSN: [id number] Arrival date & time: 02/07/17  1100     History   Chief Complaint Chief Complaint  Patient presents with  . Shortness of Breath    HPI [name] is a 66 y.o. female who presents with palpitations, chest pain, SOB. PMH significant for PAF on Xarelto, asthma, HTN, allergies. She is followed at the A. Fib clinic. She states that she had a headache yesterday and took an 800mg  Ibuprofen. This morning she woke up at about 4AM and felt SOB and was wheezing so used medicine for her asthma. She then felt like she went in to A.fib and reports associated mild substernal chest pain and palpitations. She kept going in and out of A.fib. She took a Flecanide this morning but still felt like she was in A.fib so she decided to come to the ED. She didn't take Diltiazem because she was worried about taking too many medicines at once. No syncope, fever, chills, leg swelling, cough, abdominal pain, N/V. She has been taking her potassium as prescribed  HPI  Past Medical History:  Diagnosis Date  . A-fib ([place])   . Allergy    seasonal  . Asthma   . Chronic anticoagulation   . GERD (gastroesophageal reflux disease)   . Hypertension   . PAF (paroxysmal atrial fibrillation) ([place]) 11/26/2015   April/May 2017: Multiple ED visits for A. Fib.  02/12/16: Improved on diltiazem IV. Cardiology consulted in the ED and discharge patient with diltiazem XR 120 mg once daily with 60 mg by mouth every 6 hours as needed for symptomatic palpitations, flecainide 50 mg twice daily. Referred to the atrial fibrillation clinic.  02/17/16: Seen in the atrial fibrillation clinic by NP Roderic Palau. Normal sin    Patient Active Problem List   Diagnosis Date Noted  . Constipation 10/19/2016  . Normocytic anemia 10/19/2016  . Rectal bleeding 10/19/2016  . Hypokalemia   . Excessive cerumen in both ear canals 09/10/2016  . Sinus pain 04/16/2016  . Hyperlipidemia 03/18/2016  .  Hyperglycemia 03/17/2016  . Special screening for malignant neoplasms, colon 03/17/2016  . Eczema 03/17/2016  . Cervical cancer screening 03/17/2016  . Assistance with transportation 03/17/2016  . Acute stress reaction 03/17/2016  . Hypertension   . GERD (gastroesophageal reflux disease)   . PAF (paroxysmal atrial fibrillation) ([place]) 11/26/2015  . Chronic anticoagulation 11/26/2015  . Folliculitis 38/93/7342  . Marijuana abuse 06/11/2015  . Asthma, mild intermittent   . WEIGHT LOSS, ABNORMAL 04/11/2007    Past Surgical History:  Procedure Laterality Date  . TONSILLECTOMY    . TUBAL LIGATION      OB History    No data available       Home Medications    Prior to Admission medications   Medication Sig Start Date End Date Taking? Authorizing Provider  atorvastatin (LIPITOR) 40 MG tablet Take 1 tablet (40 mg total) by mouth daily. 05/17/16 05/17/17  [name], MD  CARTIA XT 120 MG 24 hr capsule TAKE 1 CAPSULE(120 MG) BY MOUTH DAILY 12/17/16   Sherran Needs, NP  Cetirizine HCl 10 MG CAPS Take 1 capsule (10 mg total) by mouth every morning. 09/01/16   Bartholomew Crews, MD  diltiazem (CARDIZEM) 60 MG tablet TAKE 1 TABLET BY MOUTH EVERY 6 HOURS AS NEEDED FOR SENSATION OF RAPID HEARTBEAT 12/17/16   Sherran Needs, NP  flecainide (TAMBOCOR) 100 MG tablet Take 1 tablet (100 mg total) by mouth 2 (two)  times daily. 09/30/16   Barrett, Evelene Croon, PA-C  losartan (COZAAR) 25 MG tablet Take 1 tablet (25 mg total) by mouth daily. 12/08/16   [name], MD  polyethylene glycol powder (GLYCOLAX/MIRALAX) powder Mix 17g of Miralax in 8 ounces of water every other day 01/24/17   Armbruster, Renelda Loma, MD  potassium chloride SA (K-DUR,KLOR-CON) 10 MEQ tablet Take 2 tablets (20 mEq total) by mouth daily. 11/12/16   Sherran Needs, NP  ranitidine (ZANTAC) 150 MG tablet Take 1 tablet (150 mg total) by mouth 2 (two) times daily. 09/10/16   Rice, Resa Miner, MD  rivaroxaban (XARELTO) 20 MG TABS  tablet Take 1 tablet (20 mg total) by mouth daily with supper. 07/01/16   Sherran Needs, NP  tiotropium (SPIRIVA HANDIHALER) 18 MCG inhalation capsule INHALE THE CONTENTS OF 1 CAPSULE(18 MCG) VIA INHALATION DEVICE EVERY DAY 09/01/16   Bartholomew Crews, MD    Family History Family History  Problem Relation Age of Onset  . Heart attack Mother        [name] age  . Breast cancer Sister        Late 37s; now s/p mastectomy   . Lung cancer Brother        x 2  . Cancer Sister   . Cancer Sister   . Cancer Sister   . Colon cancer Neg Hx   . Colon polyps Neg Hx   . Esophageal cancer Neg Hx   . Rectal cancer Neg Hx   . Stomach cancer Neg Hx     Social History Social History  Substance Use Topics  . Smoking status: Former Smoker    Types: Cigarettes    Quit date: 10/24/2015  . Smokeless tobacco: Never Used  . Alcohol use No     Allergies   Protonix [pantoprazole sodium] and Celecoxib   Review of Systems Review of Systems  Constitutional: Negative for chills and fever.  Respiratory: Positive for shortness of breath and wheezing. Negative for cough.   Cardiovascular: Positive for chest pain and palpitations. Negative for leg swelling.  Gastrointestinal: Negative for abdominal pain and vomiting.  Allergic/Immunologic: Positive for environmental allergies.  Neurological: Negative for syncope.  All other systems reviewed and are negative.    Physical Exam Updated Vital Signs BP (!) 134/97 (BP Location: Right Arm)   Pulse (!) 118   Temp 97.3 F (36.3 C) (Oral)   Resp (!) 23   SpO2 99%   Physical Exam  Constitutional: She is oriented to person, place, and time. She appears well-developed and well-nourished. No distress.  HENT:  Head: Normocephalic and atraumatic.  Eyes: Conjunctivae are normal. Pupils are equal, round, and reactive to light. Right eye exhibits no discharge. Left eye exhibits no discharge. No scleral icterus.  Neck: Normal range of motion.    Cardiovascular: S1 normal and S2 normal.  An irregularly irregular rhythm present. Tachycardia present.  Exam reveals no gallop and no friction rub.   No murmur heard. Ranging from 80-130  Pulmonary/Chest: Effort normal. No respiratory distress. She has decreased breath sounds. She has no wheezes. She has no rales. She exhibits no tenderness.  Abdominal: Soft. Bowel sounds are normal. She exhibits no distension. There is no tenderness.  Neurological: She is alert and oriented to person, place, and time.  Skin: Skin is warm and dry.  Psychiatric: She has a normal mood and affect. Her behavior is normal.  Nursing note and vitals reviewed.    ED Treatments / Results  Labs (all labs ordered are listed, but only abnormal results are displayed) Labs Reviewed  BASIC METABOLIC PANEL - Abnormal; Notable for the following:       Result Value   Potassium 3.3 (*)    Glucose, Bld 116 (*)    All other components within normal limits  CBC - Abnormal; Notable for the following:    WBC 3.6 (*)    All other components within normal limits  I-STAT TROPOININ, ED    EKG  EKG Interpretation  Date/Time:  Monday February 07 2017 11:11:57 EDT Ventricular Rate:  126 PR Interval:    QRS Duration: 90 QT Interval:  348 QTC Calculation: 504 R Axis:   75 Text Interpretation:  Atrial fibrillation with rapid ventricular response Septal infarct , age undetermined Abnormal ECG agree. compared to previous afib Confirmed by Charlesetta Shanks [phone]) on 02/07/2017 2:09:29 PM       Radiology Dg Chest 2 View  Result Date: 02/07/2017 CLINICAL DATA:  Breathing difficulty. Atrial fibrillation. Hypertension. EXAM: CHEST  2 VIEW COMPARISON:  09/30/2016. FINDINGS: COPD with hyperinflation. Normal heart size. No effusion or pneumothorax. Bones unremarkable. Thoracic atherosclerosis. IMPRESSION: Stable exam.  COPD.  No active infiltrates or failure. Electronically Signed   By: Staci Righter M.D.   On: 02/07/2017 11:50     Procedures Procedures (including critical care time)  CRITICAL CARE Performed by: Recardo Evangelist   Total critical care time: 35 minutes  Critical care time was exclusive of separately billable procedures and treating other patients.  Critical care was necessary to treat or prevent imminent or life-threatening deterioration.  Critical care was time spent personally by me on the following activities: development of treatment plan with patient and/or surrogate as well as nursing, discussions with consultants, evaluation of patient's response to treatment, examination of patient, obtaining history from patient or surrogate, ordering and performing treatments and interventions, ordering and review of laboratory studies, ordering and review of radiographic studies, pulse oximetry and re-evaluation of patient's condition.   Medications Ordered in ED Medications  diltiazem (CARDIZEM) 1 mg/mL load via infusion 20 mg (20 mg Intravenous Bolus from Bag 02/07/17 1535)  acetaminophen (TYLENOL) tablet 1,000 mg (1,000 mg Oral Given 02/07/17 1536)  potassium chloride SA (K-DUR,KLOR-CON) CR tablet 40 mEq (40 mEq Oral Given 02/07/17 1655)     Initial Impression / Assessment and Plan / ED Course  I have reviewed the triage vital signs and the nursing notes.  Pertinent labs & imaging results that were available during my care of the patient were reviewed by me and considered in my medical decision making (see chart for details).  66 year old female who presents in A fib with RVR. Rate is 80-130s on the cardiac monitor. Otherwise vitals are normal and she is hemodynamically stable. She feels SOB on exam and lungs have decreased breaths sounds. CBC is unremarkable. BMP remarkable for mild hypokalemia (3.3). She reports adherence to K therapy. Trop is 0. Shared visit with Dr. Johnney Killian. Will give Diltiazem bolus and reassess.  After recheck, pt reports feeling better. K was replaced. She is back in SR.  Advised to continue her home medicines as prescribed and follow up with A. Fib clinic. Return precautions given.  Final Clinical Impressions(s) / ED Diagnoses   Final diagnoses:  Atrial fibrillation with RVR (HCC)  SOB (shortness of breath)  Hypokalemia    New Prescriptions New Prescriptions   No medications on file     Recardo Evangelist, Hershal Coria 02/07/17 2057  Charlesetta Shanks, MD 03/03/17 [phone]

## 2017-02-07 NOTE — Discharge Instructions (Signed)
Take Cartia XT, Flecanide, and Xarelto tonight Take Cardizem as needed if you are in A fib Call A fib clinic for follow up Return for worsening symptoms

## 2017-02-12 ENCOUNTER — Encounter (HOSPITAL_COMMUNITY): Payer: Self-pay

## 2017-02-12 ENCOUNTER — Other Ambulatory Visit: Payer: Self-pay

## 2017-02-12 DIAGNOSIS — I480 Paroxysmal atrial fibrillation: Secondary | ICD-10-CM | POA: Insufficient documentation

## 2017-02-12 DIAGNOSIS — Z79899 Other long term (current) drug therapy: Secondary | ICD-10-CM | POA: Insufficient documentation

## 2017-02-12 DIAGNOSIS — I10 Essential (primary) hypertension: Secondary | ICD-10-CM | POA: Diagnosis not present

## 2017-02-12 DIAGNOSIS — J4521 Mild intermittent asthma with (acute) exacerbation: Secondary | ICD-10-CM | POA: Insufficient documentation

## 2017-02-12 DIAGNOSIS — Z87891 Personal history of nicotine dependence: Secondary | ICD-10-CM | POA: Diagnosis not present

## 2017-02-12 DIAGNOSIS — R0602 Shortness of breath: Secondary | ICD-10-CM | POA: Diagnosis present

## 2017-02-12 NOTE — ED Triage Notes (Signed)
Pt complaining of central chest pain and SOB x 1 week. Pt with hx of afib. Pt tachy at triage, HR = 140. Pt states on Xerelto for same.

## 2017-02-13 ENCOUNTER — Other Ambulatory Visit: Payer: Self-pay

## 2017-02-13 ENCOUNTER — Emergency Department (HOSPITAL_COMMUNITY): Payer: Medicare Other

## 2017-02-13 ENCOUNTER — Emergency Department (HOSPITAL_COMMUNITY)
Admission: EM | Admit: 2017-02-13 | Discharge: 2017-02-13 | Disposition: A | Discharge status: Home / Self Care | Payer: Medicare Other | Attending: Emergency Medicine | Admitting: Emergency Medicine

## 2017-02-13 DIAGNOSIS — J4521 Mild intermittent asthma with (acute) exacerbation: Secondary | ICD-10-CM | POA: Diagnosis not present

## 2017-02-13 DIAGNOSIS — I480 Paroxysmal atrial fibrillation: Secondary | ICD-10-CM

## 2017-02-13 LAB — BASIC METABOLIC PANEL
ANION GAP: 9 (ref 5–15)
BUN: 14 mg/dL (ref 6–20)
CALCIUM: 10 mg/dL (ref 8.9–10.3)
CO2: 23 mmol/L (ref 22–32)
Chloride: 111 mmol/L (ref 101–111)
Creatinine, Ser: 1.05 mg/dL — ABNORMAL HIGH (ref 0.44–1.00)
GFR calc Af Amer: >60 mL/min (ref >60)
GFR, EST NON AFRICAN AMERICAN: 53 mL/min — AB (ref >60)
Glucose, Bld: 92 mg/dL (ref 65–99)
Potassium: 3.6 mmol/L (ref 3.5–5.1)
Sodium: 143 mmol/L (ref 135–145)

## 2017-02-13 LAB — CBC
HCT: 39.6 % (ref 36.0–46.0)
HEMOGLOBIN: 13.2 g/dL (ref 12.0–15.0)
MCH: 27 pg (ref 26.0–34.0)
MCHC: 33.3 g/dL (ref 30.0–36.0)
MCV: 81.1 fL (ref 78.0–100.0)
Platelets: 236 10*3/uL (ref 150–400)
RBC: 4.88 MIL/uL (ref 3.87–5.11)
RDW: 14.6 % (ref 11.5–15.5)
WBC: 6.2 10*3/uL (ref 4.0–10.5)

## 2017-02-13 LAB — I-STAT TROPONIN, ED: TROPONIN I, POC: 0 ng/mL (ref 0.00–0.08)

## 2017-02-13 MED ORDER — ALBUTEROL SULFATE HFA 108 (90 BASE) MCG/ACT IN AERS
1.0000 | INHALATION_SPRAY | Freq: Once | RESPIRATORY_TRACT | Status: AC
  Administered 2017-02-13: 1 via RESPIRATORY_TRACT
  Filled 2017-02-13: qty 6.7

## 2017-02-13 MED ORDER — METHYLPREDNISOLONE SODIUM SUCC 125 MG IJ SOLR
125.0000 mg | Freq: Once | INTRAMUSCULAR | Status: AC
  Administered 2017-02-13: 125 mg via INTRAVENOUS
  Filled 2017-02-13: qty 2

## 2017-02-13 MED ORDER — IPRATROPIUM-ALBUTEROL 0.5-2.5 (3) MG/3ML IN SOLN: 3.0000 mL | Freq: Once | RESPIRATORY_TRACT | Status: DC

## 2017-02-13 MED ORDER — METOPROLOL TARTRATE 5 MG/5ML IV SOLN
5.0000 mg | Freq: Once | INTRAVENOUS | Status: AC
  Administered 2017-02-13: 5 mg via INTRAVENOUS
  Filled 2017-02-13: qty 5

## 2017-02-13 MED ORDER — PREDNISONE 20 MG PO TABS: 60.0000 mg | ORAL_TABLET | Freq: Every day | ORAL | 0 refills | Status: DC

## 2017-02-13 MED ORDER — IPRATROPIUM-ALBUTEROL 0.5-2.5 (3) MG/3ML IN SOLN
3.0000 mL | Freq: Once | RESPIRATORY_TRACT | Status: AC
  Administered 2017-02-13: 3 mL via RESPIRATORY_TRACT
  Filled 2017-02-13: qty 3

## 2017-02-13 NOTE — Discharge Instructions (Signed)
If you develop fevers of 100.4 higher, begin coughing up phlegm, feel short of breath, please return to the hospital or see your primary care physician as soon as possible. Your labs today were reassuring. Your chest x-ray showed a slight possibility of early pneumonia but clinically I do not think that this is truly a pneumonia but if you develop symptoms of pneumonia you may need to be started on antibiotics.  Continue your Xarelto, diltiazem and flecainide as prescribed for your atrial fibrillation. You were in atrial fibrillation but converted into a normal sinus rhythm after IV metoprolol in the emergency department.

## 2017-02-13 NOTE — ED Provider Notes (Signed)
By signing my name below, I, Eunice Blase, attest that this documentation has been prepared under the direction and in the presence of Lesslie Mossa, Delice Bison, DO. Electronically signed, Eunice Blase, ED Scribe. 02/13/17. 12:44 AM.   TIME SEEN: 12:32 AM  CHIEF COMPLAINT: SO  HPI:  [name] is a 66 y.o. female with h/o asthma, atrial fibrillation presenting to the Emergency Department concerning gradually worsening SOB x 1 week. Associated wheezing at night, throat discomfort, throat congestion and headache. Currently mild pressure like, fluctuating central chest pain described. States she gets similar symptoms with her asthma exacerbations. No modifying factors noted.  No asthma exacerbations x 2 years; h/o treatment with albuterol and prednisone for ashtma exacerbations. Pt states she uses prescribed medication to control asthma, and she states she has a nebulizer machine at home that she did not use PTA because she no longer has the medication to put in it. Pt on xarelto for h/o A-Fib. Pt also on diltiazem and flecainide regularly; she states she has taken all of her prescribed medications as advised. No, N/V, diaphoresis, dizziness, diarrhea, black or bloody stools noted.  ROS: See HPI Constitutional: no fever  Eyes: no drainage  ENT: no runny nose   Cardiovascular: CP Resp: SOB GI: no vomiting GU: no dysuria Integumentary: no rash  Allergy: no hives  Musculoskeletal: no leg swelling  Neurological: no slurred speech ROS otherwise negative  PAST MEDICAL HISTORY/PAST SURGICAL HISTORY:  Past Medical History:  Diagnosis Date  . A-fib ([place])   . Allergy    seasonal  . Asthma   . Chronic anticoagulation   . GERD (gastroesophageal reflux disease)   . Hypertension   . PAF (paroxysmal atrial fibrillation) ([place]) 11/26/2015   April/May 2017: Multiple ED visits for A. Fib.  02/12/16: Improved on diltiazem IV. Cardiology consulted in the ED and discharge patient with diltiazem XR 120 mg once  daily with 60 mg by mouth every 6 hours as needed for symptomatic palpitations, flecainide 50 mg twice daily. Referred to the atrial fibrillation clinic.  02/17/16: Seen in the atrial fibrillation clinic by NP Roderic Palau. Normal sin    MEDICATIONS:  Prior to Admission medications   Medication Sig Start Date End Date Taking? Authorizing Provider  atorvastatin (LIPITOR) 40 MG tablet Take 1 tablet (40 mg total) by mouth daily. 05/17/16 05/17/17  [name], MD  CARTIA XT 120 MG 24 hr capsule TAKE 1 CAPSULE(120 MG) BY MOUTH DAILY 12/17/16   Sherran Needs, NP  Cetirizine HCl 10 MG CAPS Take 1 capsule (10 mg total) by mouth every morning. 09/01/16   Bartholomew Crews, MD  diltiazem (CARDIZEM) 60 MG tablet TAKE 1 TABLET BY MOUTH EVERY 6 HOURS AS NEEDED FOR SENSATION OF RAPID HEARTBEAT 12/17/16   Sherran Needs, NP  flecainide (TAMBOCOR) 100 MG tablet Take 1 tablet (100 mg total) by mouth 2 (two) times daily. 09/30/16   Barrett, Evelene Croon, PA-C  losartan (COZAAR) 25 MG tablet Take 1 tablet (25 mg total) by mouth daily. 12/08/16   [name], MD  polyethylene glycol powder (GLYCOLAX/MIRALAX) powder Mix 17g of Miralax in 8 ounces of water every other day 01/24/17   Armbruster, Renelda Loma, MD  potassium chloride SA (K-DUR,KLOR-CON) 10 MEQ tablet Take 2 tablets (20 mEq total) by mouth daily. 11/12/16   Sherran Needs, NP  ranitidine (ZANTAC) 150 MG tablet Take 1 tablet (150 mg total) by mouth 2 (two) times daily. 09/10/16   Collier Salina, MD  rivaroxaban (  XARELTO) 20 MG TABS tablet Take 1 tablet (20 mg total) by mouth daily with supper. 07/01/16   Sherran Needs, NP  tiotropium (SPIRIVA HANDIHALER) 18 MCG inhalation capsule INHALE THE CONTENTS OF 1 CAPSULE(18 MCG) VIA INHALATION DEVICE EVERY DAY 09/01/16   Bartholomew Crews, MD    ALLERGIES:  Allergies  Allergen Reactions  . Protonix [Pantoprazole Sodium] Other (See Comments)    Heart races  . Celecoxib Rash    SOCIAL HISTORY:  Social  History  Substance Use Topics  . Smoking status: Former Smoker    Types: Cigarettes    Quit date: 10/24/2015  . Smokeless tobacco: Never Used  . Alcohol use No    FAMILY HISTORY: Family History  Problem Relation Age of Onset  . Heart attack Mother        [name] age  . Breast cancer Sister        Late 23s; now s/p mastectomy   . Lung cancer Brother        x 2  . Cancer Sister   . Cancer Sister   . Cancer Sister   . Colon cancer Neg Hx   . Colon polyps Neg Hx   . Esophageal cancer Neg Hx   . Rectal cancer Neg Hx   . Stomach cancer Neg Hx     EXAM: BP (!) 165/103 (BP Location: Left Arm)   Pulse (!) 137   Temp 98.1 F (36.7 C) (Oral)   Resp 18   SpO2 100%  CONSTITUTIONAL: Alert and oriented and responds appropriately to questions. Well-appearing; well-nourished HEAD: Normocephalic EYES: Conjunctivae clear, pupils appear equal, EOMI ENT: normal nose; moist mucous membranes NECK: Supple, no meningismus, no nuchal rigidity, no LAD  CARD: irregularly irregular, tachycardic; S1 and S2 appreciated; no murmurs, no clicks, no rubs, no gallops RESP: Normal chest excursion without splinting or tachypnea; scattered expiratory wheezes; diminished aeration at bases bilaterally; no rhonchi, no rales, no hypoxia or respiratory distress, speaking full sentences ABD/GI: Normal bowel sounds; non-distended; soft, non-tender, no rebound, no guarding, no peritoneal signs, no hepatosplenomegaly BACK:  The back appears normal and is non-tender to palpation, there is no CVA tenderness EXT: Normal ROM in all joints; non-tender to palpation; no edema; normal capillary refill; no cyanosis, no calf tenderness or swelling    SKIN: Normal color for age and race; warm; no rash NEURO: Moves all extremities equally PSYCH: The patient's mood and manner are appropriate. Grooming and personal hygiene are appropriate.  MEDICAL DECISION MAKING: Patient here with A. fib with RVR and an asthma exacerbation that  she thinks is causing your chest pain shortness of breath. Will give dose of IV metoprolol and doing ab, Solu-Medrol. We'll obtain labs, chest x-ray. She does not appear volume overloaded. Does not appear to be in any distress. No fevers, cough.  ED PROGRESS: Patient's labs are unremarkable. She is converted back to a sinus rhythm after metoprolol and her lungs are now completely clear after breathing treatment. She has good aeration and no hypoxia. Heart rate in the 60s. She states she has primary care follow-up will be scheduled on June 27. Chest x-ray read atelectasis versus early right lower lobe pneumonia. Clinically I do not think this is pneumonia but we have discussed this discussed that if she develops cough, fever or return of her pain or shortness of breath that she was she returned to her primary care doctor or to the ER. She is comfortable to this plan.  At this time, I do  not feel there is any life-threatening condition present. I have reviewed and discussed all results (EKG, imaging, lab, urine as appropriate) and exam findings with patient/family. I have reviewed nursing notes and appropriate previous records.  I feel the patient is safe to be discharged home without further emergent workup and can continue workup as an outpatient as needed. Discussed usual and customary return precautions. Patient/family verbalize understanding and are comfortable with this plan.  Outpatient follow-up has been provided if needed. All questions have been answered.    EKG Interpretation  Date/Time:  Saturday February 12 2017 23:53:43 EDT Ventricular Rate:  137 PR Interval:  166 QRS Duration: 88 QT Interval:  308 QTC Calculation: 465 R Axis:   74 Text Interpretation:  A fib with  RVR Septal infarct , age undetermined Abnormal ECG Confirmed by Pryor Curia [phone]) on 02/13/2017 3:01:32 AM        EKG Interpretation  Date/Time:  Sunday February 13 2017 00:01:01 EDT Ventricular Rate:  138 PR  Interval:  166 QRS Duration: 94 QT Interval:  326 QTC Calculation: 493 R Axis:   69 Text Interpretation:  Atrial flutter with variable A-V block Septal infarct , age undetermined Abnormal ECG Confirmed by Destany Severns, Cyril Mourning [phone]) on 02/13/2017 3:01:53 AM        EKG Interpretation  Date/Time:  Sunday February 13 2017 02:52:59 EDT Ventricular Rate:  62 PR Interval:  166 QRS Duration: 97 QT Interval:  452 QTC Calculation: 459 R Axis:   65 Text Interpretation:  Sinus rhythm Ventricular premature complex Nonspecific T abnrm, anterolateral leads Confirmed by Pryor Curia [phone]) on 02/13/2017 3:02:25 AM        I personally performed the services described in this documentation, which was scribed in my presence. The recorded information has been reviewed and is accurate.        Stevan Eberwein, Delice Bison, DO 02/13/17 [phone]

## 2017-02-13 NOTE — ED Notes (Signed)
Pt ambulatory to restroom with steady gait.

## 2017-02-16 ENCOUNTER — Encounter: Payer: Self-pay | Admitting: Internal Medicine

## 2017-02-16 ENCOUNTER — Ambulatory Visit (INDEPENDENT_AMBULATORY_CARE_PROVIDER_SITE_OTHER): Payer: Medicare Other | Admitting: Internal Medicine

## 2017-02-16 ENCOUNTER — Ambulatory Visit (HOSPITAL_COMMUNITY)
Admission: RE | Admit: 2017-02-16 | Discharge: 2017-02-16 | Disposition: A | Discharge status: Home / Self Care | Payer: Medicare Other | Source: Ambulatory Visit | Attending: Nurse Practitioner | Admitting: Nurse Practitioner

## 2017-02-16 ENCOUNTER — Encounter (HOSPITAL_COMMUNITY): Payer: Self-pay | Admitting: Nurse Practitioner

## 2017-02-16 VITALS — BP 180/96 | HR 58 | Temp 97.7°F | Wt 148.0 lb

## 2017-02-16 VITALS — BP 180/96 | HR 64 | Ht 67.0 in | Wt 147.0 lb

## 2017-02-16 DIAGNOSIS — Z79899 Other long term (current) drug therapy: Secondary | ICD-10-CM | POA: Insufficient documentation

## 2017-02-16 DIAGNOSIS — I10 Essential (primary) hypertension: Secondary | ICD-10-CM

## 2017-02-16 DIAGNOSIS — Z87891 Personal history of nicotine dependence: Secondary | ICD-10-CM | POA: Diagnosis not present

## 2017-02-16 DIAGNOSIS — J4520 Mild intermittent asthma, uncomplicated: Secondary | ICD-10-CM

## 2017-02-16 DIAGNOSIS — I480 Paroxysmal atrial fibrillation: Secondary | ICD-10-CM

## 2017-02-16 DIAGNOSIS — Z7901 Long term (current) use of anticoagulants: Secondary | ICD-10-CM

## 2017-02-16 DIAGNOSIS — Z8249 Family history of ischemic heart disease and other diseases of the circulatory system: Secondary | ICD-10-CM | POA: Insufficient documentation

## 2017-02-16 DIAGNOSIS — E876 Hypokalemia: Secondary | ICD-10-CM | POA: Diagnosis not present

## 2017-02-16 DIAGNOSIS — J45909 Unspecified asthma, uncomplicated: Secondary | ICD-10-CM | POA: Insufficient documentation

## 2017-02-16 DIAGNOSIS — K219 Gastro-esophageal reflux disease without esophagitis: Secondary | ICD-10-CM | POA: Insufficient documentation

## 2017-02-16 DIAGNOSIS — Z801 Family history of malignant neoplasm of trachea, bronchus and lung: Secondary | ICD-10-CM | POA: Diagnosis not present

## 2017-02-16 DIAGNOSIS — Z748 Other problems related to care provider dependency: Secondary | ICD-10-CM

## 2017-02-16 DIAGNOSIS — Z888 Allergy status to other drugs, medicaments and biological substances status: Secondary | ICD-10-CM | POA: Insufficient documentation

## 2017-02-16 DIAGNOSIS — Z803 Family history of malignant neoplasm of breast: Secondary | ICD-10-CM | POA: Diagnosis not present

## 2017-02-16 DIAGNOSIS — I4891 Unspecified atrial fibrillation: Secondary | ICD-10-CM | POA: Diagnosis present

## 2017-02-16 DIAGNOSIS — Z23 Encounter for immunization: Secondary | ICD-10-CM | POA: Diagnosis not present

## 2017-02-16 DIAGNOSIS — Z1211 Encounter for screening for malignant neoplasm of colon: Secondary | ICD-10-CM

## 2017-02-16 DIAGNOSIS — Z7951 Long term (current) use of inhaled steroids: Secondary | ICD-10-CM

## 2017-02-16 MED ORDER — PREDNISONE 20 MG PO TABS: 40.0000 mg | ORAL_TABLET | Freq: Every day | ORAL | 0 refills | Status: DC

## 2017-02-16 MED ORDER — SENNOSIDES-DOCUSATE SODIUM 8.6-50 MG PO TABS: 1.0000 | ORAL_TABLET | Freq: Two times a day (BID) | ORAL | 2 refills | Status: DC

## 2017-02-16 MED ORDER — TIOTROPIUM BROMIDE MONOHYDRATE 18 MCG IN CAPS: ORAL_CAPSULE | RESPIRATORY_TRACT | 5 refills | Status: DC
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
## 2017-02-16 NOTE — Progress Notes (Signed)
   CC: Chest congestion, cough with brown sputum and wheezing for one week.  HPI:  [name] is a 66 y.o. with past medical history as listed below came to the clinic with complaint of chest congestion, cough with brownish sputum for 1 week. According to patient her asthma was very well controlled up until a month ago, when she started developing frequent episodes of paroxysmal A. fib and wheezing. She do endorse some shortness of breath and chills but denies any fever. She denies any chest pain, orthopnea or PND. She denies any palpitations today. According to patient her appetite is normal, she do endorse intermittent constipation, denies any diarrhea, she also denies any urinary symptoms. She denies any recent sick contact. She was recently seen multiple times in ED, last visit was on 02/13/2017, with shortness of breath and palpitations, her chest x-ray done during that visit shows a new right mid lobe infiltrate/atelectasis.  Past Medical History:  Diagnosis Date  . A-fib ([place])   . Allergy    seasonal  . Asthma   . Chronic anticoagulation   . GERD (gastroesophageal reflux disease)   . Hypertension   . PAF (paroxysmal atrial fibrillation) ([place]) 11/26/2015   April/May 2017: Multiple ED visits for A. Fib.  02/12/16: Improved on diltiazem IV. Cardiology consulted in the ED and discharge patient with diltiazem XR 120 mg once daily with 60 mg by mouth every 6 hours as needed for symptomatic palpitations, flecainide 50 mg twice daily. Referred to the atrial fibrillation clinic.  02/17/16: Seen in the atrial fibrillation clinic by NP Roderic Palau. Normal sin    Review of Systems:  As per HPI.  Physical Exam:  Vitals:   02/16/17 1343 02/16/17 1447  BP: (!) 170/89 (!) 180/96  Pulse: 65 (!) 58  Temp: 97.7 F (36.5 C)   TempSrc: Oral   SpO2: 100%   Weight: 148 lb (67.1 kg)    Vitals:   02/16/17 1343 02/16/17 1447  BP: (!) 170/89 (!) 180/96  Pulse: 65 (!) 58  Temp: 97.7 F  (36.5 C)   TempSrc: Oral   SpO2: 100%   Weight: 148 lb (67.1 kg)    General: Vital signs reviewed.  Patient is well-developed and well-nourished, in no acute distress and cooperative with exam.  Cardiovascular: RRR, S1 normal, S2 normal, no murmurs, gallops, or rubs. Pulmonary/Chest:.Bilateral scattered wheeze. Abdominal: Soft, non-tender, non-distended, BS +, no masses, organomegaly, or guarding present.  Extremities: No lower extremity edema bilaterally,  pulses symmetric and intact bilaterally. No cyanosis or clubbing. Psychiatric: Normal mood and affect. speech and behavior is normal. Cognition and memory are normal.  Assessment & Plan:   See Encounters Tab for problem based charting.  Patient discussed with Dr. Evette Doffing.

## 2017-02-16 NOTE — Assessment & Plan Note (Addendum)
Currently she is having more exacerbations of her asthma.  Her chest x-ray does show a small right mid lobe infiltrate/atelectasis. She remained afebrile and there was no leukocytosis.  Most likely she is having exacerbation of her asthma. She was given a 3 day course of prednisone during her previous ED visit on 02/13/2017.  -She was given a prescription of prednisone for a little longer duration with a tapering dose. -She was advised to used her Spiriva regularly and use albuterol as needed. -I did not give her any prescription for antibiotics as macrolide or fluoroquinolone both can increased QTC along with flecainide.

## 2017-02-16 NOTE — Assessment & Plan Note (Signed)
On colonoscopy she is having tubular adenoma without any evidence of dysplasia or hyperplasia.  -She was provided with PCV 13 today. -She was encouraged to make her appointment for mammography and DEXA scan, referral was provided during previous office visit.

## 2017-02-16 NOTE — Patient Instructions (Signed)
Thank you for visiting clinic today. I'm giving you a little prolonged course of prednisone, I hope that will help with your cough and wheezing. I am not giving any antibiotics because of fewer A. fib medication. You can discuss antibiotics with your cardiologist at [facility] if you want. Your blood pressure is also high but I am not making any changes as that can be addressed in your A. fib clinic.

## 2017-02-16 NOTE — Assessment & Plan Note (Signed)
She currently followed up at atrial fibrillation clinic. She had an appointment today at 3 PM.  She denies any palpitations today.  She is compliant with her xarelto, Cardizem and flecainide.

## 2017-02-16 NOTE — Assessment & Plan Note (Signed)
BP Readings from Last 3 Encounters:  02/16/17 (!) 180/96  02/16/17 (!) 180/96  02/13/17 132/87   Her blood pressure was elevated today. She is currently taking losartan 25 mg daily, according to patient she did took her medications this morning.  She was going to see her cardiologist after this visit. I did not make any changes to her medications we will let cardiology decide according to her anti-arrhythmics.

## 2017-02-17 ENCOUNTER — Telehealth (HOSPITAL_COMMUNITY): Payer: Self-pay | Admitting: *Deleted

## 2017-02-17 ENCOUNTER — Telehealth: Payer: Self-pay | Admitting: *Deleted

## 2017-02-17 NOTE — Telephone Encounter (Signed)
She should take her prednisone.

## 2017-02-17 NOTE — Telephone Encounter (Signed)
Pt called in very anxious and crying stating she has went back into afib this morning. She took all her morning medications as normal and took a 60mg  cardizem around 1030am. Reassured patient that with prednisone this can trigger breakthrough afib. Encouraged patient to take a 1/2 tablet of cardizem 60mg  tablet now and lay down and relax. Instructed patient to call me back at 3pm to let me know how she is doing. Pt calmed down by end of conversation after reassurance.

## 2017-02-17 NOTE — Telephone Encounter (Signed)
Asking to speak with a nurse. Please call pt back.

## 2017-02-17 NOTE — Progress Notes (Signed)
Internal Medicine Clinic Attending  Case discussed with Dr. Amin at the time of the visit.  We reviewed the resident's history and exam and pertinent patient test results.  I agree with the assessment, diagnosis, and plan of care documented in the resident's note.    

## 2017-02-17 NOTE — Telephone Encounter (Signed)
Pt called back stating she has returned to normal rhythm and feeling much better. Pt reassured that it is safe for her to use the PRN cardizem for elevated heart rates. Pt verbalized understanding.

## 2017-02-17 NOTE — Telephone Encounter (Signed)
PT calls and states this am when she took her meds including the 3 prednisone from ED and an allergy pill, cetirizine and used her inhaler for shortness of breath she then went into afib, took all her meds at appr 0830, the afib started about 1000 and took her afib med at 1030. She does not know if the prednisone caused the problem or the allergy med or inhaler or all combined. Also she has a lot of congestion today, states all the drainage is causing her to feel like choking. She needs for you to determine if she should start the prednisone that you prescribed at visit tomorrow. Please advise

## 2017-02-18 NOTE — Telephone Encounter (Signed)
Call from pt - stated she took Prednisone this morning and she feels "alright - no afib". Just want to let her doctor be aware.

## 2017-03-08 ENCOUNTER — Other Ambulatory Visit: Payer: Self-pay | Admitting: Internal Medicine

## 2017-03-08 DIAGNOSIS — E2839 Other primary ovarian failure: Secondary | ICD-10-CM

## 2017-03-10 ENCOUNTER — Ambulatory Visit
Admission: RE | Admit: 2017-03-10 | Discharge: 2017-03-10 | Disposition: A | Discharge status: Home / Self Care | Payer: Medicare Other | Source: Ambulatory Visit | Attending: Internal Medicine | Admitting: Internal Medicine

## 2017-03-10 DIAGNOSIS — Z1239 Encounter for other screening for malignant neoplasm of breast: Secondary | ICD-10-CM

## 2017-03-10 DIAGNOSIS — E2839 Other primary ovarian failure: Secondary | ICD-10-CM

## 2017-03-13 IMAGING — CR DG CHEST 1V PORT
1 series · 1 of 1 positions shown · non-contrast
Comparison: October 09, 2015

CLINICAL DATA: Chest pain and cough for several days

EXAM:
PORTABLE CHEST 1 VIEW

[AP]
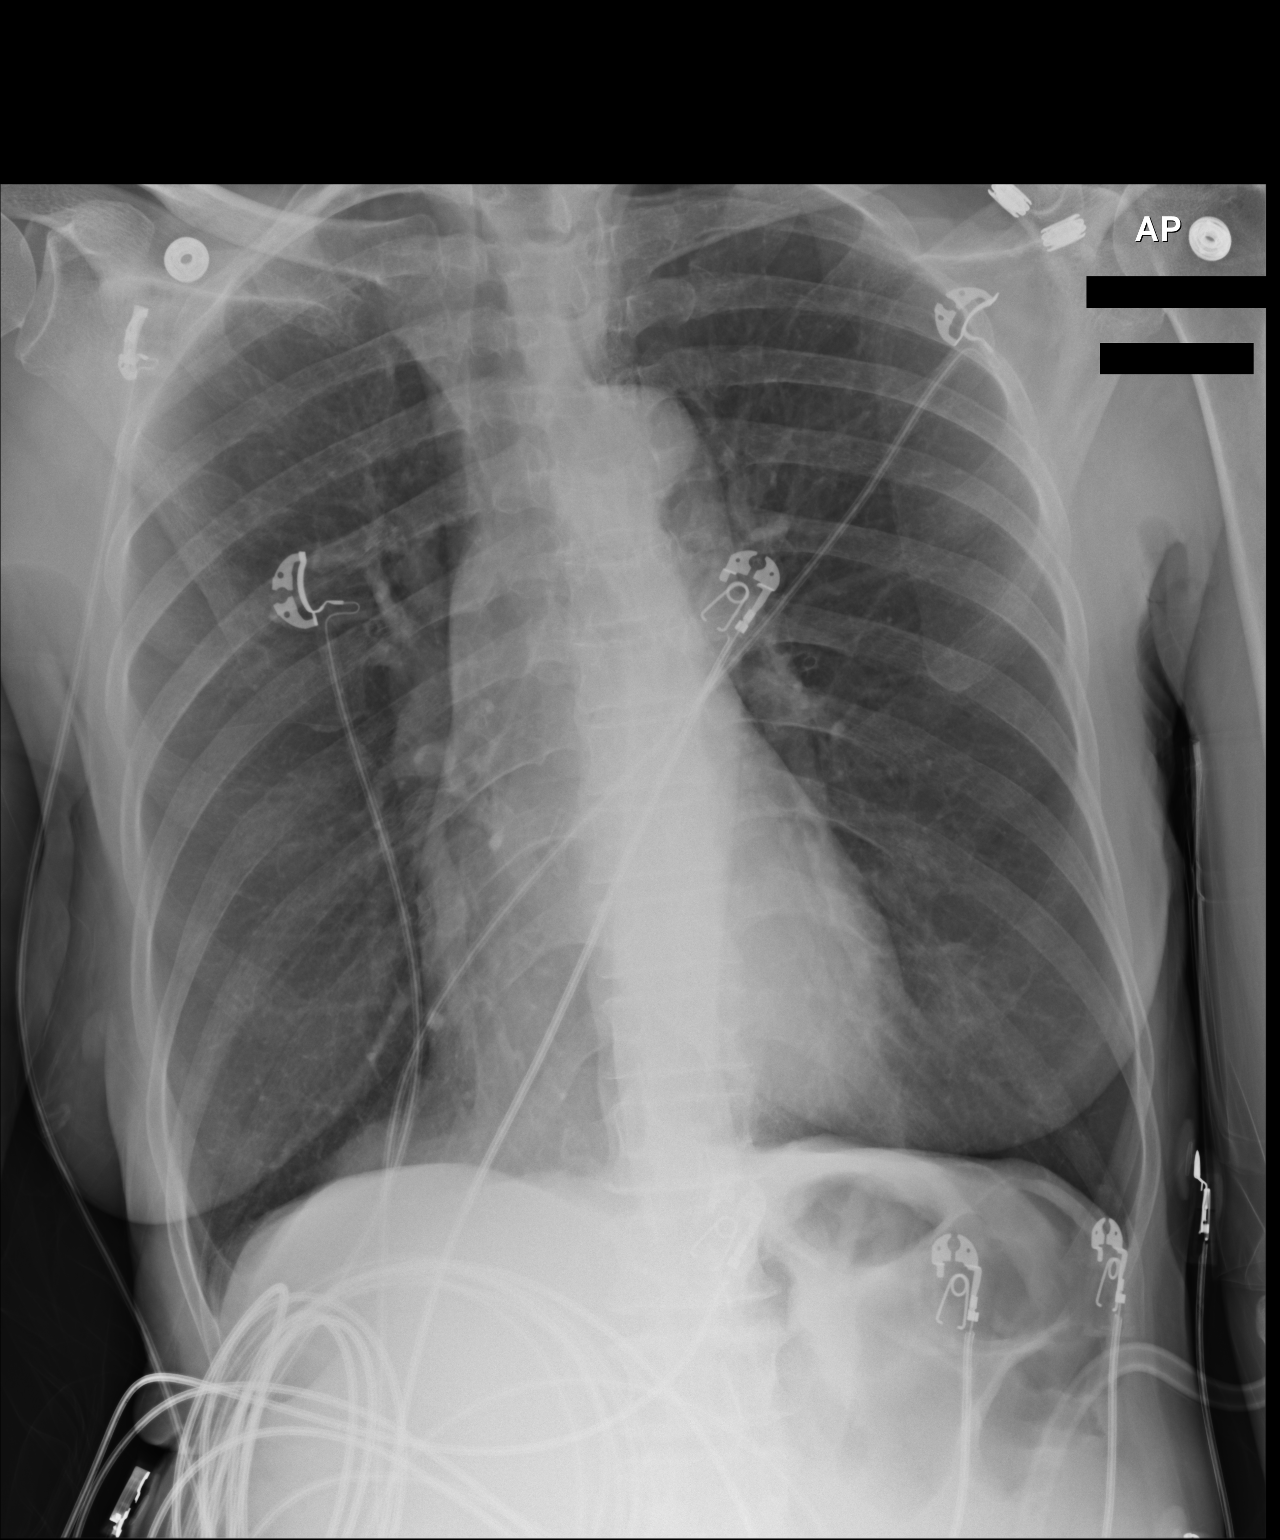

[1 of 1 positions shown; findings below may reference images not displayed]

FINDINGS: There is no edema or consolidation. Lungs are somewhat
hyperexpanded. Heart size and pulmonary vascularity are within
normal limits. No adenopathy. No pneumothorax. No bone lesions.
IMPRESSION: Lungs hyperexpanded without edema or consolidation.

## 2017-03-15 ENCOUNTER — Other Ambulatory Visit (HOSPITAL_COMMUNITY): Payer: Self-pay | Admitting: *Deleted

## 2017-03-15 MED ORDER — RIVAROXABAN 20 MG PO TABS: 20.0000 mg | ORAL_TABLET | Freq: Every day | ORAL | 6 refills | Status: DC

## 2017-03-16 ENCOUNTER — Other Ambulatory Visit: Payer: Self-pay | Admitting: Internal Medicine

## 2017-03-16 ENCOUNTER — Other Ambulatory Visit: Payer: Self-pay | Admitting: Student in an Organized Health Care Education/Training Program

## 2017-03-16 DIAGNOSIS — K219 Gastro-esophageal reflux disease without esophagitis: Secondary | ICD-10-CM

## 2017-03-30 ENCOUNTER — Ambulatory Visit (HOSPITAL_COMMUNITY): Payer: Self-pay | Admitting: Nurse Practitioner

## 2017-04-04 ENCOUNTER — Other Ambulatory Visit: Payer: Self-pay

## 2017-04-04 MED ORDER — ALBUTEROL SULFATE HFA 108 (90 BASE) MCG/ACT IN AERS: 2.0000 | INHALATION_SPRAY | RESPIRATORY_TRACT | 1 refills | Status: DC | PRN

## 2017-04-04 NOTE — Telephone Encounter (Signed)
albuterol (PROVENTIL HFA) 108 (90 Base) MCG/ACT inhaler, refill request @ [facility] on [address].

## 2017-04-15 ENCOUNTER — Ambulatory Visit (INDEPENDENT_AMBULATORY_CARE_PROVIDER_SITE_OTHER): Payer: Medicare Other | Admitting: Internal Medicine

## 2017-04-15 ENCOUNTER — Encounter: Payer: Self-pay | Admitting: Internal Medicine

## 2017-04-15 ENCOUNTER — Telehealth: Payer: Self-pay

## 2017-04-15 VITALS — BP 169/74 | HR 60 | Temp 97.5°F | Ht 67.0 in | Wt 158.6 lb

## 2017-04-15 DIAGNOSIS — Z8249 Family history of ischemic heart disease and other diseases of the circulatory system: Secondary | ICD-10-CM | POA: Diagnosis not present

## 2017-04-15 DIAGNOSIS — Z79899 Other long term (current) drug therapy: Secondary | ICD-10-CM

## 2017-04-15 DIAGNOSIS — M810 Age-related osteoporosis without current pathological fracture: Secondary | ICD-10-CM | POA: Diagnosis not present

## 2017-04-15 DIAGNOSIS — I480 Paroxysmal atrial fibrillation: Secondary | ICD-10-CM | POA: Diagnosis not present

## 2017-04-15 DIAGNOSIS — Z7901 Long term (current) use of anticoagulants: Secondary | ICD-10-CM

## 2017-04-15 DIAGNOSIS — J00 Acute nasopharyngitis [common cold]: Secondary | ICD-10-CM | POA: Diagnosis not present

## 2017-04-15 DIAGNOSIS — I10 Essential (primary) hypertension: Secondary | ICD-10-CM

## 2017-04-15 DIAGNOSIS — Z87891 Personal history of nicotine dependence: Secondary | ICD-10-CM

## 2017-04-15 MED ORDER — FLUTICASONE PROPIONATE 50 MCG/ACT NA SUSP: 1.0000 | Freq: Every day | NASAL | 2 refills | Status: DC

## 2017-04-15 MED ORDER — CETIRIZINE HCL 10 MG PO CAPS: 1.0000 | ORAL_CAPSULE | Freq: Every morning | ORAL | 11 refills | Status: DC

## 2017-04-15 MED ORDER — SALINE SPRAY 0.65 % NA SOLN: 1.0000 | NASAL | 2 refills | Status: DC | PRN

## 2017-04-15 MED ORDER — LOSARTAN POTASSIUM 25 MG PO TABS: 25.0000 mg | ORAL_TABLET | Freq: Every day | ORAL | 3 refills | Status: DC

## 2017-04-15 NOTE — Assessment & Plan Note (Addendum)
She had her DEXA scan done on 03/10/2017 which shows a score of -2.7, which makes her with the diagnosis of osteoporosis. She is high risk for any pathologic fracture. She should be started on a bisphosphonate. We discussed about alendronate as our first choice. Patient was given necessity instructions and also precautions to prevent any fall. We will check her labs for BMP, calcium, phosphorous and vitamin D before starting her on alendronate. We will call the pharmacy once these results are available.  Addendum.her kidney functions, phosphorous and calcium are within normal limits. Vitamin D levels are low at 23. Prescription for vitamin D 2000 units daily was sent to her pharmacy. Patient was advised to take vitamin D and a calcium supplement daily. We will start her on alendronate next week.

## 2017-04-15 NOTE — Assessment & Plan Note (Signed)
Her symptoms and exam was more consistent with common cold.  She was advised to use Flonase and saline nasal spray. She was also given a prescription for cetirizine as she ran out of that.

## 2017-04-15 NOTE — Progress Notes (Signed)
   CC: Nasal congestion with clear secretions, accompanied with some sneezing and dry cough for 3 days.  HPI:  [name] is a 66 y.o. lady with past medical history as listed below came to the clinic with complaint of some nasal congestion with clear secretions, also associated with some sneezing and dry cough for past 3 days. She denies any fever or chills. She denies any headache or facial pain. She denied any chest pain, palpitations or shortness of breath.  Please see assessment and plan for her chronic problems.  Past Medical History:  Diagnosis Date  . A-fib ([place])   . Allergy    seasonal  . Asthma   . Chronic anticoagulation   . GERD (gastroesophageal reflux disease)   . Hypertension   . PAF (paroxysmal atrial fibrillation) ([place]) 11/26/2015   April/May 2017: Multiple ED visits for A. Fib.  02/12/16: Improved on diltiazem IV. Cardiology consulted in the ED and discharge patient with diltiazem XR 120 mg once daily with 60 mg by mouth every 6 hours as needed for symptomatic palpitations, flecainide 50 mg twice daily. Referred to the atrial fibrillation clinic.  02/17/16: Seen in the atrial fibrillation clinic by NP Roderic Palau. Normal sin   Review of Systems:  As per HPI.  Physical Exam:  Vitals:   04/15/17 1320  BP: (!) 169/74  Pulse: 60  Temp: (!) 97.5 F (36.4 C)  TempSrc: Oral  SpO2: 100%  Weight: 158 lb 9.6 oz (71.9 kg)  Height: 5\' 7"  (1.702 m)    General: Vital signs reviewed.  Patient is well-developed and well-nourished, in no acute distress and cooperative with exam.  HEENT.: Normocephalic and atraumatic.Mild erythema and edema of both nasal turbinate, no pharyngeal erythema, edema or exudate. No maxillary or frontal sinus tenderness. Cardiovascular: RRR, S1 normal, S2 normal, no murmurs, gallops, or rubs. Pulmonary/Chest: Clear to auscultation bilaterally, no wheezes, rales, or rhonchi. Abdominal: Soft, non-tender, non-distended, BS +, no masses,  organomegaly, or guarding present.  Extremities: No lower extremity edema bilaterally,  pulses symmetric and intact bilaterally. No cyanosis or clubbing. Skin: Warm, dry and intact. No rashes or erythema. Psychiatric: Normal mood and affect. speech and behavior is normal. Cognition and memory are normal.  Assessment & Plan:   See Encounters Tab for problem based charting.  Patient discussed with Dr. Beryle Beams.

## 2017-04-15 NOTE — Patient Instructions (Signed)
Thank you for coming to the clinic today. As we discussed that you have to watch for your diet and exercise regularly and use low sodium diet to keep your blood pressure under good control. Your blood pressure was little high today but I am not making any changes because you are eating high sodium food recently. As we also discussed that your bone scan shows weak bones which can put you at high risk for fractures. We would like to treat you for that, we will do some lab work today and then call for that medicine. I'm also giving you some information regarding your bone condition called osteoporosis and the medicine I want to prescribe for you.  Alendronate tablets What is this medicine? ALENDRONATE (a LEN droe nate) slows calcium loss from bones. It helps to make normal healthy bone and to slow bone loss in people with Paget's disease and osteoporosis. It may be used in others at risk for bone loss. This medicine may be used for other purposes; ask your health care provider or pharmacist if you have questions. COMMON BRAND NAME(S): Fosamax What should I tell my health care provider before I take this medicine? They need to know if you have any of these conditions: -dental disease -esophagus, stomach, or intestine problems, like acid reflux or GERD -kidney disease -low blood calcium -low vitamin D -problems sitting or standing 30 minutes -trouble swallowing -an unusual or allergic reaction to alendronate, other medicines, foods, dyes, or preservatives -pregnant or trying to get pregnant -breast-feeding How should I use this medicine? You must take this medicine exactly as directed or you will lower the amount of the medicine you absorb into your body or you may cause yourself harm. Take this medicine by mouth first thing in the morning, after you are up for the day. Do not eat or drink anything before you take your medicine. Swallow the tablet with a full glass (6 to 8 fluid ounces) of plain  water. Do not take this medicine with any other drink. Do not chew or crush the tablet. After taking this medicine, do not eat breakfast, drink, or take any medicines or vitamins for at least 30 minutes. Sit or stand up for at least 30 minutes after you take this medicine; do not lie down. Do not take your medicine more often than directed. Talk to your pediatrician regarding the use of this medicine in children. Special care may be needed. Overdosage: If you think you have taken too much of this medicine contact a poison control center or emergency room at once. NOTE: This medicine is only for you. Do not share this medicine with others. What if I miss a dose? If you miss a dose, do not take it later in the day. Continue your normal schedule starting the next morning. Do not take double or extra doses. What may interact with this medicine? -aluminum hydroxide -antacids -aspirin -calcium supplements -drugs for inflammation like ibuprofen, naproxen, and others -iron supplements -magnesium supplements -vitamins with minerals This list may not describe all possible interactions. Give your health care provider a list of all the medicines, herbs, non-prescription drugs, or dietary supplements you use. Also tell them if you smoke, drink alcohol, or use illegal drugs. Some items may interact with your medicine. What should I watch for while using this medicine? Visit your doctor or health care professional for regular checks ups. It may be some time before you see benefit from this medicine. Do not stop taking your medicine  except on your doctor's advice. Your doctor or health care professional may order blood tests and other tests to see how you are doing. You should make sure you get enough calcium and vitamin D while you are taking this medicine, unless your doctor tells you not to. Discuss the foods you eat and the vitamins you take with your health care professional. Some people who take this  medicine have severe bone, joint, and/or muscle pain. This medicine may also increase your risk for a broken thigh bone. Tell your doctor right away if you have pain in your upper leg or groin. Tell your doctor if you have any pain that does not go away or that gets worse. This medicine can make you more sensitive to the sun. If you get a rash while taking this medicine, sunlight may cause the rash to get worse. Keep out of the sun. If you cannot avoid being in the sun, wear protective clothing and use sunscreen. Do not use sun lamps or tanning beds/booths. What side effects may I notice from receiving this medicine? Side effects that you should report to your doctor or health care professional as soon as possible: -allergic reactions like skin rash, itching or hives, swelling of the face, lips, or tongue -black or tarry stools -bone, muscle or joint pain -changes in vision -chest pain -heartburn or stomach pain -jaw pain, especially after dental work -pain or trouble when swallowing -redness, blistering, peeling or loosening of the skin, including inside the mouth Side effects that usually do not require medical attention (report to your doctor or health care professional if they continue or are bothersome): -changes in taste -diarrhea or constipation -eye pain or itching -headache -nausea or vomiting -stomach gas or fullness This list may not describe all possible side effects. Call your doctor for medical advice about side effects. You may report side effects to FDA at 1-800-FDA-1088. Where should I keep my medicine? Keep out of the reach of children. Store at room temperature of 15 and 30 degrees C (59 and 86 degrees F). Throw away any unused medicine after the expiration date. NOTE: This sheet is a summary. It may not cover all possible information. If you have questions about this medicine, talk to your doctor, pharmacist, or health care provider.  2018 Elsevier/Gold Standard  (2011-02-05 08:56:09)  Osteoporosis Osteoporosis happens when your bones become thinner and weaker. Weak bones can break (fracture) more easily when you slip or fall. Bones most at risk of breaking are in the hip, wrist, and spine. Follow these instructions at home:  Get enough calcium and vitamin D. These nutrients are good for your bones.  Exercise as told by your doctor.  Do not use any tobacco products. This includes cigarettes, chewing tobacco, and electronic cigarettes. If you need help quitting, ask your doctor.  Limit the amount of alcohol you drink.  Take medicines only as told by your doctor.  Keep all follow-up visits as told by your doctor. This is important.  Take care at home to prevent falls. Some ways to do this are: ? Keep rooms well lit and tidy. ? Put safety rails on your stairs. ? Put a rubber mat in the bathroom and other places that are often wet or slippery. Get help right away if:  You fall.  You hurt yourself. This information is not intended to replace advice given to you by your health care provider. Make sure you discuss any questions you have with your health care provider.  Document Released: 11/01/2011 Document Revised: 01/15/2016 Document Reviewed: 01/17/2014 Elsevier Interactive Patient Education  [employer].

## 2017-04-15 NOTE — Assessment & Plan Note (Signed)
Currently in sinus rhythm.  She follow-up with A. fib clinic regularly.  Continue follow-up with A. fib clinic. Continue Cardizem, flecainide and Xarelto.

## 2017-04-15 NOTE — Assessment & Plan Note (Signed)
BP Readings from Last 3 Encounters:  04/15/17 (!) 169/74  02/16/17 (!) 180/96  02/16/17 (!) 180/96   Her blood pressure was elevated today. According to patient recently she is eating food with high sodium content and pork chops. She do not want to make any changes to her antihypertensives, stating that she is more comfortable doing that by her cardiologist.  I advised her regarding regular exercise and following a low sodium diet. She has an upcoming appointment early next month with her cardiologist.

## 2017-04-15 NOTE — Progress Notes (Signed)
Medicine attending: Medical history, presenting problems, physical findings, and medications, reviewed with resident physician Dr Sumayya Amin on the day of the patient visit and I concur with her evaluation and management plan. 

## 2017-04-15 NOTE — Telephone Encounter (Signed)
Per patient allergy med is not cover by the insurance. Please call pt back.

## 2017-04-16 ENCOUNTER — Other Ambulatory Visit (HOSPITAL_COMMUNITY): Payer: Self-pay | Admitting: Nurse Practitioner

## 2017-04-16 LAB — PHOSPHORUS: Phosphorus: 3 mg/dL (ref 2.5–4.5)

## 2017-04-16 LAB — BMP8+ANION GAP
ANION GAP: 14 mmol/L (ref 10.0–18.0)
BUN/Creatinine Ratio: 18 (ref 12–28)
BUN: 13 mg/dL (ref 8–27)
CALCIUM: 10 mg/dL (ref 8.7–10.3)
CO2: 27 mmol/L (ref 20–29)
CREATININE: 0.73 mg/dL (ref 0.57–1.00)
Chloride: 101 mmol/L (ref 96–106)
GFR calc Af Amer: 98 mL/min/{1.73_m2} (ref >59)
GFR, EST NON AFRICAN AMERICAN: 85 mL/min/{1.73_m2} (ref >59)
Glucose: 72 mg/dL (ref 65–99)
Potassium: 4 mmol/L (ref 3.5–5.2)
SODIUM: 142 mmol/L (ref 134–144)

## 2017-04-16 LAB — VITAMIN D 25 HYDROXY (VIT D DEFICIENCY, FRACTURES): Vit D, 25-Hydroxy: 23 ng/mL — ABNORMAL LOW (ref 30.0–100.0)

## 2017-04-16 LAB — MICROALBUMIN / CREATININE URINE RATIO
CREATININE, UR: 46.8 mg/dL
MICROALB/CREAT RATIO: 57.5 mg/g{creat} — AB (ref 0.0–30.0)
MICROALBUM., U, RANDOM: 26.9 ug/mL

## 2017-04-17 ENCOUNTER — Other Ambulatory Visit: Payer: Self-pay

## 2017-04-17 ENCOUNTER — Emergency Department (HOSPITAL_COMMUNITY)
Admission: EM | Admit: 2017-04-17 | Discharge: 2017-04-17 | Disposition: A | Discharge status: Home / Self Care | Payer: Medicare Other | Attending: Emergency Medicine | Admitting: Emergency Medicine

## 2017-04-17 ENCOUNTER — Encounter (HOSPITAL_COMMUNITY): Payer: Self-pay | Admitting: Emergency Medicine

## 2017-04-17 ENCOUNTER — Emergency Department (HOSPITAL_COMMUNITY): Payer: Medicare Other

## 2017-04-17 DIAGNOSIS — Z87891 Personal history of nicotine dependence: Secondary | ICD-10-CM | POA: Diagnosis not present

## 2017-04-17 DIAGNOSIS — R002 Palpitations: Secondary | ICD-10-CM | POA: Diagnosis present

## 2017-04-17 DIAGNOSIS — I480 Paroxysmal atrial fibrillation: Secondary | ICD-10-CM | POA: Diagnosis not present

## 2017-04-17 DIAGNOSIS — I10 Essential (primary) hypertension: Secondary | ICD-10-CM | POA: Insufficient documentation

## 2017-04-17 DIAGNOSIS — J45909 Unspecified asthma, uncomplicated: Secondary | ICD-10-CM | POA: Insufficient documentation

## 2017-04-17 DIAGNOSIS — Z79899 Other long term (current) drug therapy: Secondary | ICD-10-CM | POA: Diagnosis not present

## 2017-04-17 DIAGNOSIS — I4891 Unspecified atrial fibrillation: Secondary | ICD-10-CM

## 2017-04-17 LAB — BASIC METABOLIC PANEL
Anion gap: 7 (ref 5–15)
BUN: 10 mg/dL (ref 6–20)
CALCIUM: 9.3 mg/dL (ref 8.9–10.3)
CO2: 24 mmol/L (ref 22–32)
CREATININE: 0.64 mg/dL (ref 0.44–1.00)
Chloride: 107 mmol/L (ref 101–111)
GFR calc Af Amer: >60 mL/min (ref >60)
GLUCOSE: 83 mg/dL (ref 65–99)
Potassium: 3.2 mmol/L — ABNORMAL LOW (ref 3.5–5.1)
SODIUM: 138 mmol/L (ref 135–145)

## 2017-04-17 LAB — CBC
HEMATOCRIT: 38.8 % (ref 36.0–46.0)
Hemoglobin: 12.8 g/dL (ref 12.0–15.0)
MCH: 26.8 pg (ref 26.0–34.0)
MCHC: 33 g/dL (ref 30.0–36.0)
MCV: 81.2 fL (ref 78.0–100.0)
PLATELETS: 240 10*3/uL (ref 150–400)
RBC: 4.78 MIL/uL (ref 3.87–5.11)
RDW: 15.8 % — AB (ref 11.5–15.5)
WBC: 3.7 10*3/uL — ABNORMAL LOW (ref 4.0–10.5)

## 2017-04-17 LAB — I-STAT TROPONIN, ED: TROPONIN I, POC: 0.01 ng/mL (ref 0.00–0.08)

## 2017-04-17 MED ORDER — FLECAINIDE ACETATE 100 MG PO TABS
100.0000 mg | ORAL_TABLET | Freq: Once | ORAL | Status: AC
  Administered 2017-04-17: 100 mg via ORAL
  Filled 2017-04-17: qty 1

## 2017-04-17 MED ORDER — DILTIAZEM HCL 60 MG PO TABS: ORAL_TABLET | ORAL | 0 refills | Status: DC

## 2017-04-17 MED ORDER — DILTIAZEM HCL 60 MG PO TABS
60.0000 mg | ORAL_TABLET | Freq: Once | ORAL | Status: AC
  Administered 2017-04-17: 60 mg via ORAL
  Filled 2017-04-17: qty 1

## 2017-04-17 MED ORDER — CETIRIZINE-PSEUDOEPHEDRINE ER 5-120 MG PO TB12: 1.0000 | ORAL_TABLET | Freq: Every day | ORAL | 0 refills | Status: DC

## 2017-04-17 NOTE — ED Triage Notes (Addendum)
Pt states "when she uses her inhaler it puts her into afib. Then it causes her to have asthma"  Pt states shes out of Cardizem tablets. She takes it as needed.

## 2017-04-17 NOTE — ED Provider Notes (Signed)
[facility] DEPT Provider Note   CSN: [id number] Arrival date & time: 04/17/17  1005     History   Chief Complaint Chief Complaint  Patient presents with  . Atrial Fibrillation    HPI [name] is a 66 y.o. female.  HPI  Patient presents with concern of palpitations, chest discomfort. Patient acknowledges multiple medical issues including atrial fibrillation for which she takes multiple medication daily, has breakthrough episodes frequently. She notes that about one week ago she developed URI like symptoms with rhinorrhea, congestion, frontal sinus pressure. She was prescribedinhaled Flonase.  She notes that each time she has used that medication over the past 2 or 3 days she has felt an episode of palpitations, consistent with prior episodes of atrial fibrillation. Typically these episodes resolved after taking a dose of oral diltiazem.  She does not have any more of this medication. Today, the patient's palpitations began about one hour ago after using Flonase. She denies other chest pain, abdominal pain or lightheadedness. No fevers, no chills.   Past Medical History:  Diagnosis Date  . A-fib ([place])   . Allergy    seasonal  . Asthma   . Chronic anticoagulation   . GERD (gastroesophageal reflux disease)   . Hypertension   . PAF (paroxysmal atrial fibrillation) ([place]) 11/26/2015   April/May 2017: Multiple ED visits for A. Fib.  02/12/16: Improved on diltiazem IV. Cardiology consulted in the ED and discharge patient with diltiazem XR 120 mg once daily with 60 mg by mouth every 6 hours as needed for symptomatic palpitations, flecainide 50 mg twice daily. Referred to the atrial fibrillation clinic.  02/17/16: Seen in the atrial fibrillation clinic by NP Roderic Palau. Normal sin    Patient Active Problem List   Diagnosis Date Noted  . Osteoporosis without current pathological fracture 04/15/2017  . Common cold 04/15/2017  . Constipation 10/19/2016  . Normocytic anemia  10/19/2016  . Rectal bleeding 10/19/2016  . Excessive cerumen in both ear canals 09/10/2016  . Hyperlipidemia 03/18/2016  . Special screening for malignant neoplasms, colon 03/17/2016  . Eczema 03/17/2016  . Cervical cancer screening 03/17/2016  . Assistance with transportation 03/17/2016  . Hypertension   . GERD (gastroesophageal reflux disease)   . PAF (paroxysmal atrial fibrillation) ([place]) 11/26/2015  . Chronic anticoagulation 11/26/2015  . Folliculitis 99/37/1696  . Marijuana abuse 06/11/2015  . Asthma, mild intermittent     Past Surgical History:  Procedure Laterality Date  . TONSILLECTOMY    . TUBAL LIGATION      OB History    No data available       Home Medications    Prior to Admission medications   Medication Sig Start Date End Date Taking? Authorizing Provider  albuterol (PROVENTIL HFA) 108 (90 Base) MCG/ACT inhaler Inhale 2 puffs into the lungs every 4 (four) hours as needed for wheezing or shortness of breath. 04/04/17   [name], MD  atorvastatin (LIPITOR) 40 MG tablet Take 1 tablet (40 mg total) by mouth daily. 05/17/16 05/17/17  [name], MD  CARTIA XT 120 MG 24 hr capsule TAKE 1 CAPSULE(120 MG) BY MOUTH DAILY 12/17/16   Sherran Needs, NP  Cetirizine HCl 10 MG CAPS Take 1 capsule (10 mg total) by mouth every morning. 04/15/17   [name], MD  diltiazem (CARDIZEM) 60 MG tablet TAKE 1 TABLET BY MOUTH EVERY 6 HOURS AS NEEDED FOR SENSATION OF RAPID HEARTBEAT 12/17/16   Sherran Needs, NP  flecainide (TAMBOCOR) 100 MG tablet  Take 1 tablet (100 mg total) by mouth 2 (two) times daily. 09/30/16   Barrett, Evelene Croon, PA-C  fluticasone (FLONASE) 50 MCG/ACT nasal spray Place 1 spray into both nostrils daily. 04/15/17 04/15/18  [name], MD  losartan (COZAAR) 25 MG tablet Take 1 tablet (25 mg total) by mouth daily. 04/15/17   [name], MD  polyethylene glycol powder (GLYCOLAX/MIRALAX) powder Mix 17g of Miralax in 8 ounces of water every other day 01/24/17    Armbruster, Renelda Loma, MD  potassium chloride SA (K-DUR,KLOR-CON) 10 MEQ tablet Take 2 tablets (20 mEq total) by mouth daily. 11/12/16   Sherran Needs, NP  predniSONE (DELTASONE) 20 MG tablet Take 2 tablets (40 mg total) by mouth daily. Please take 40 mg daily for 1 week, following week take 30 mg or 1-1/2 tablet for 4 days, then decreased her dose to 20 mg or 1 tablet for another 4 days, followed by 10 mg or half tablet for 4 more days before stopping it. Patient not taking: Reported on 02/16/2017 02/16/17 02/16/18  [name], MD  ranitidine (ZANTAC) 150 MG tablet TAKE 1 TABLET BY MOUTH TWICE DAILY 03/16/17   Axel Filler, MD  rivaroxaban (XARELTO) 20 MG TABS tablet Take 1 tablet (20 mg total) by mouth daily with supper. 03/15/17   Sherran Needs, NP  senna-docusate (SENNA S) 8.6-50 MG tablet Take 1 tablet by mouth 2 (two) times daily. 02/16/17   [name], MD  sodium chloride (OCEAN) 0.65 % SOLN nasal spray Place 1 spray into both nostrils as needed for congestion. 04/15/17   [name], MD  tiotropium (SPIRIVA HANDIHALER) 18 MCG inhalation capsule INHALE THE CONTENTS OF 1 CAPSULE(18 MCG) VIA INHALATION DEVICE EVERY DAY 02/16/17   [name], MD    Family History Family History  Problem Relation Age of Onset  . Heart attack Mother        [name] age  . Breast cancer Sister        Late 30s; now s/p mastectomy   . Lung cancer Brother        x 2  . Cancer Sister   . Cancer Sister   . Cancer Sister   . Colon cancer Neg Hx   . Colon polyps Neg Hx   . Esophageal cancer Neg Hx   . Rectal cancer Neg Hx   . Stomach cancer Neg Hx     Social History Social History  Substance Use Topics  . Smoking status: Former Smoker    Types: Cigarettes    Quit date: 10/24/2015  . Smokeless tobacco: Never Used  . Alcohol use No     Allergies   Protonix [pantoprazole sodium] and Celecoxib   Review of Systems Review of Systems  Constitutional:       Per HPI, otherwise negative   HENT:       Per HPI, otherwise negative  Respiratory:       Per HPI, otherwise negative  Cardiovascular:       Per HPI, otherwise negative  Gastrointestinal: Negative for vomiting.  Endocrine:       Negative aside from HPI  Genitourinary:       Neg aside from HPI   Musculoskeletal:       Per HPI, otherwise negative  Skin: Negative.   Neurological: Negative for syncope.     Physical Exam Updated Vital Signs BP (!) 123/92   Pulse 89   Temp 98.3 F (36.8 C)   Resp 18   SpO2  98%   Physical Exam  Constitutional: She is oriented to person, place, and time. She appears well-developed and well-nourished. No distress.  HENT:  Head: Normocephalic and atraumatic.  Nose: Rhinorrhea present.  Eyes: Conjunctivae and EOM are normal.  Cardiovascular: An irregularly irregular rhythm present. Tachycardia present.   Pulmonary/Chest: Effort normal and breath sounds normal. No stridor. No respiratory distress.  Abdominal: She exhibits no distension.  Musculoskeletal: She exhibits no edema.  Neurological: She is alert and oriented to person, place, and time. No cranial nerve deficit.  Skin: Skin is warm and dry.  Psychiatric: She has a normal mood and affect.  Nursing note and vitals reviewed.    ED Treatments / Results  Labs (all labs ordered are listed, but only abnormal results are displayed) Labs Reviewed  BASIC METABOLIC PANEL  CBC  I-STAT TROPONIN, ED    EKG  EKG Interpretation  Date/Time:  Sunday April 17 2017 10:16:11 EDT Ventricular Rate:  130 PR Interval:    QRS Duration: 92 QT Interval:  356 QTC Calculation: 523 R Axis:   56 Text Interpretation:  Atrial fibrillation with rapid ventricular response ST-t wave abnormality Artifact Abnormal ekg Confirmed by Carmin Muskrat [phone]) on 04/17/2017 10:56:03 AM       EKG #2 Sinus rhythm, rate 61, PVC, rsr pattern in anterior precordium, borderline  Radiology Dg Chest 2 View  Result Date: 04/17/2017 CLINICAL  DATA:  Atrial fibrillation EXAM: CHEST  2 VIEW COMPARISON:  02/13/2017 FINDINGS: Mild scarring in the right lower lung. Mild right basilar scarring/atelectasis. No focal consolidation. No pleural effusion or pneumothorax. The heart is normal in size. Visualized osseous structures are within normal limits. IMPRESSION: No active cardiopulmonary disease. Electronically Signed   By: Julian Hy M.D.   On: 04/17/2017 10:40    Procedures Procedures (including critical care time)  Medications Ordered in ED Medications  flecainide (TAMBOCOR) tablet 100 mg (not administered)  diltiazem (CARDIZEM) tablet 60 mg (not administered)     Initial Impression / Assessment and Plan / ED Course  I have reviewed the triage vital signs and the nursing notes.  Pertinent labs & imaging results that were available during my care of the patient were reviewed by me and considered in my medical decision making (see chart for details).  Chart review notable for history of atrial fibrillation, chronic anticoagulation, as well as recent outpatient visit for sinusitis or URI like illness. With patient's use of both flecainide and calcium channel blocker, she provided a single dose of both, while being monitored.  2:27 PM Patient has had successful cardioversion after provision of Cardizem and flecainide. No return to atrial fibrillation, no subsequent hypotension after-hours of monitoring. With no other notable findings, and with return to normal sinus rhythm, the patient was discharged in stable condition.  Patient requested an alternative medication to her inhaled steroid, and this was provided as well.  Final Clinical Impressions(s) / ED Diagnoses   Final diagnoses:  Atrial fibrillation with RVR ([place])      Carmin Muskrat, MD 04/17/17 1428

## 2017-04-17 NOTE — Discharge Instructions (Signed)
As discussed, your evaluation today has been largely reassuring.  But, it is important that you monitor your condition carefully, and do not hesitate to return to the ED if you develop new, or concerning changes in your condition. ? ?Otherwise, please follow-up with your physician for appropriate ongoing care. ? ?

## 2017-04-18 MED ORDER — VITAMIN D 50 MCG (2000 UT) PO CAPS: 2000.0000 [IU] | ORAL_CAPSULE | Freq: Every day | ORAL | 2 refills | Status: DC

## 2017-04-18 NOTE — Addendum Note (Signed)
Addended by: [name] on: 04/18/2017 08:58 AM   Modules accepted: Orders

## 2017-04-20 ENCOUNTER — Telehealth: Payer: Self-pay

## 2017-04-20 ENCOUNTER — Telehealth: Payer: Self-pay | Admitting: *Deleted

## 2017-04-20 NOTE — Telephone Encounter (Signed)
Please see dr Latina Craver note of 8/29

## 2017-04-20 NOTE — Telephone Encounter (Addendum)
Information was sent throw CoverMyMeds for [place] for Cetrizine .  Awaiting decision within 72 hours.  Sander Nephew, RN 04/20/2017 10:26 AM Fax from [facility]  Medicare does not cover OTC meds.  Call to [place] requested that Cetrizine be rununder Medicaid. Med is on the preferred list and will cost patient $3. Pharmacy to prepare for patient .  Patient was called and informed of.  Sander Nephew, RN 04/21/2017 8:53 AM.

## 2017-04-20 NOTE — Telephone Encounter (Signed)
I called the patient and left message that she can stop taking Flonase and there is no need for any replacement at this time.

## 2017-04-20 NOTE — Telephone Encounter (Signed)
Patient requesting to be called by MD. Ok Edwards 8/24, was given Flonase, zyrtec. Went to ED 8/26 for palpitations.  Patient states flonase made her go into Afib. Requesting alternative, preferrably a pill.  Best contact# [phone].

## 2017-04-20 NOTE — Telephone Encounter (Signed)
Pt is calling back to speak with a nurse. Please call back.  

## 2017-04-24 ENCOUNTER — Encounter (HOSPITAL_COMMUNITY): Payer: Self-pay | Admitting: Emergency Medicine

## 2017-04-24 ENCOUNTER — Encounter (HOSPITAL_COMMUNITY): Payer: Self-pay

## 2017-04-24 ENCOUNTER — Other Ambulatory Visit: Payer: Self-pay

## 2017-04-24 ENCOUNTER — Emergency Department (HOSPITAL_COMMUNITY): Payer: Medicare Other

## 2017-04-24 ENCOUNTER — Emergency Department (HOSPITAL_COMMUNITY)
Admission: EM | Admit: 2017-04-24 | Discharge: 2017-04-24 | Disposition: A | Discharge status: Home / Self Care | Payer: Medicare Other | Source: Home / Self Care | Attending: Emergency Medicine | Admitting: Emergency Medicine

## 2017-04-24 ENCOUNTER — Emergency Department (HOSPITAL_COMMUNITY)
Admission: EM | Admit: 2017-04-24 | Discharge: 2017-04-24 | Disposition: A | Discharge status: Home / Self Care | Payer: Medicare Other | Attending: Emergency Medicine | Admitting: Emergency Medicine

## 2017-04-24 DIAGNOSIS — I4891 Unspecified atrial fibrillation: Secondary | ICD-10-CM

## 2017-04-24 DIAGNOSIS — Z87891 Personal history of nicotine dependence: Secondary | ICD-10-CM

## 2017-04-24 DIAGNOSIS — J45909 Unspecified asthma, uncomplicated: Secondary | ICD-10-CM | POA: Diagnosis not present

## 2017-04-24 DIAGNOSIS — Z79899 Other long term (current) drug therapy: Secondary | ICD-10-CM

## 2017-04-24 DIAGNOSIS — T7840XA Allergy, unspecified, initial encounter: Secondary | ICD-10-CM

## 2017-04-24 DIAGNOSIS — L234 Allergic contact dermatitis due to dyes: Secondary | ICD-10-CM | POA: Insufficient documentation

## 2017-04-24 DIAGNOSIS — R000 Tachycardia, unspecified: Secondary | ICD-10-CM | POA: Diagnosis not present

## 2017-04-24 DIAGNOSIS — R062 Wheezing: Secondary | ICD-10-CM | POA: Insufficient documentation

## 2017-04-24 DIAGNOSIS — I10 Essential (primary) hypertension: Secondary | ICD-10-CM | POA: Insufficient documentation

## 2017-04-24 DIAGNOSIS — R918 Other nonspecific abnormal finding of lung field: Secondary | ICD-10-CM | POA: Diagnosis not present

## 2017-04-24 DIAGNOSIS — I480 Paroxysmal atrial fibrillation: Secondary | ICD-10-CM | POA: Insufficient documentation

## 2017-04-24 DIAGNOSIS — R002 Palpitations: Secondary | ICD-10-CM | POA: Diagnosis present

## 2017-04-24 LAB — BASIC METABOLIC PANEL
ANION GAP: 9 (ref 5–15)
BUN: 12 mg/dL (ref 6–20)
CO2: 23 mmol/L (ref 22–32)
Calcium: 9.5 mg/dL (ref 8.9–10.3)
Chloride: 110 mmol/L (ref 101–111)
Creatinine, Ser: 0.85 mg/dL (ref 0.44–1.00)
GFR calc Af Amer: >60 mL/min (ref >60)
GFR calc non Af Amer: >60 mL/min (ref >60)
GLUCOSE: 162 mg/dL — AB (ref 65–99)
POTASSIUM: 3.5 mmol/L (ref 3.5–5.1)
Sodium: 142 mmol/L (ref 135–145)

## 2017-04-24 LAB — CBC
HEMATOCRIT: 39.4 % (ref 36.0–46.0)
HEMOGLOBIN: 12.9 g/dL (ref 12.0–15.0)
MCH: 26.5 pg (ref 26.0–34.0)
MCHC: 32.7 g/dL (ref 30.0–36.0)
MCV: 81.1 fL (ref 78.0–100.0)
Platelets: 213 10*3/uL (ref 150–400)
RBC: 4.86 MIL/uL (ref 3.87–5.11)
RDW: 16 % — ABNORMAL HIGH (ref 11.5–15.5)
WBC: 6.4 10*3/uL (ref 4.0–10.5)

## 2017-04-24 MED ORDER — KETOROLAC TROMETHAMINE 30 MG/ML IJ SOLN
30.0000 mg | Freq: Once | INTRAMUSCULAR | Status: AC
  Administered 2017-04-24: 30 mg via INTRAVENOUS
  Filled 2017-04-24: qty 1

## 2017-04-24 MED ORDER — ALBUTEROL SULFATE (2.5 MG/3ML) 0.083% IN NEBU
5.0000 mg | INHALATION_SOLUTION | Freq: Once | RESPIRATORY_TRACT | Status: AC
  Administered 2017-04-24: 5 mg via RESPIRATORY_TRACT
  Filled 2017-04-24: qty 6

## 2017-04-24 MED ORDER — PREDNISONE 20 MG PO TABS
60.0000 mg | ORAL_TABLET | Freq: Once | ORAL | Status: AC
  Administered 2017-04-24: 60 mg via ORAL
  Filled 2017-04-24: qty 3

## 2017-04-24 MED ORDER — METOPROLOL TARTRATE 5 MG/5ML IV SOLN
5.0000 mg | Freq: Once | INTRAVENOUS | Status: AC
  Administered 2017-04-24: 5 mg via INTRAVENOUS
  Filled 2017-04-24: qty 5

## 2017-04-24 MED ORDER — PREDNISONE 10 MG (21) PO TBPK: ORAL_TABLET | Freq: Every day | ORAL | 0 refills | Status: DC

## 2017-04-24 MED ORDER — CALAMINE EX LOTN: 1.0000 "application " | TOPICAL_LOTION | CUTANEOUS | 0 refills | Status: DC | PRN

## 2017-04-24 MED ORDER — DIPHENHYDRAMINE HCL 50 MG/ML IJ SOLN
50.0000 mg | Freq: Once | INTRAMUSCULAR | Status: AC
  Administered 2017-04-24: 50 mg via INTRAMUSCULAR
  Filled 2017-04-24: qty 1

## 2017-04-24 MED ORDER — ALBUTEROL SULFATE HFA 108 (90 BASE) MCG/ACT IN AERS
2.0000 | INHALATION_SPRAY | RESPIRATORY_TRACT | Status: DC
  Administered 2017-04-24: 2 via RESPIRATORY_TRACT
  Filled 2017-04-24: qty 6.7

## 2017-04-24 MED ORDER — TRIAMCINOLONE ACETONIDE 0.025 % EX CREA: 1.0000 "application " | TOPICAL_CREAM | Freq: Two times a day (BID) | CUTANEOUS | 0 refills | Status: DC

## 2017-04-24 MED ORDER — DILTIAZEM HCL 60 MG PO TABS
60.0000 mg | ORAL_TABLET | Freq: Once | ORAL | Status: DC
  Filled 2017-04-24: qty 1

## 2017-04-24 MED ORDER — DILTIAZEM HCL 30 MG PO TABS
30.0000 mg | ORAL_TABLET | Freq: Once | ORAL | Status: AC
  Administered 2017-04-24: 30 mg via ORAL

## 2017-04-24 NOTE — ED Notes (Signed)
,  Pt verbalized understanding discharge instructions and denies any further needs or questions at this time. VS stable, ambulatory and steady gait.   

## 2017-04-24 NOTE — ED Provider Notes (Signed)
[facility] DEPT Provider Note   CSN: [id number] Arrival date & time: 04/24/17  0844     History   Chief Complaint No chief complaint on file.   HPI [name] is a 66 y.o. female.  66 year old female presents with pruritus around her hairline after using hair dye. Slight wheezing but does have a history of asthma. Denies any cracked lips. No dyspnea or stridor according to the patient. Symptoms have been relieved with take an over-the-counter medications. Denies any whole body hives. No recent fever or chills or URI symptoms.      Past Medical History:  Diagnosis Date  . A-fib ([place])   . Allergy    seasonal  . Asthma   . Chronic anticoagulation   . GERD (gastroesophageal reflux disease)   . Hypertension   . PAF (paroxysmal atrial fibrillation) ([place]) 11/26/2015   April/May 2017: Multiple ED visits for A. Fib.  02/12/16: Improved on diltiazem IV. Cardiology consulted in the ED and discharge patient with diltiazem XR 120 mg once daily with 60 mg by mouth every 6 hours as needed for symptomatic palpitations, flecainide 50 mg twice daily. Referred to the atrial fibrillation clinic.  02/17/16: Seen in the atrial fibrillation clinic by NP Roderic Palau. Normal sin    Patient Active Problem List   Diagnosis Date Noted  . Osteoporosis without current pathological fracture 04/15/2017  . Common cold 04/15/2017  . Constipation 10/19/2016  . Normocytic anemia 10/19/2016  . Rectal bleeding 10/19/2016  . Excessive cerumen in both ear canals 09/10/2016  . Hyperlipidemia 03/18/2016  . Special screening for malignant neoplasms, colon 03/17/2016  . Eczema 03/17/2016  . Cervical cancer screening 03/17/2016  . Assistance with transportation 03/17/2016  . Hypertension   . GERD (gastroesophageal reflux disease)   . PAF (paroxysmal atrial fibrillation) ([place]) 11/26/2015  . Chronic anticoagulation 11/26/2015  . Folliculitis 96/22/2979  . Marijuana abuse 06/11/2015  . Asthma, mild  intermittent     Past Surgical History:  Procedure Laterality Date  . TONSILLECTOMY    . TUBAL LIGATION      OB History    No data available       Home Medications    Prior to Admission medications   Medication Sig Start Date End Date Taking? Authorizing Provider  albuterol (PROVENTIL HFA) 108 (90 Base) MCG/ACT inhaler Inhale 2 puffs into the lungs every 4 (four) hours as needed for wheezing or shortness of breath. 04/04/17   [name], MD  atorvastatin (LIPITOR) 40 MG tablet Take 1 tablet (40 mg total) by mouth daily. 05/17/16 05/17/17  [name], MD  CARTIA XT 120 MG 24 hr capsule TAKE 1 CAPSULE(120 MG) BY MOUTH DAILY 12/17/16   Sherran Needs, NP  Cetirizine HCl 10 MG CAPS Take 1 capsule (10 mg total) by mouth every morning. Patient taking differently: Take 10 mg by mouth daily as needed (for allergies).  04/15/17   [name], MD  cetirizine-pseudoephedrine (ZYRTEC-D) 5-120 MG tablet Take 1 tablet by mouth daily. 04/17/17   Carmin Muskrat, MD  Cholecalciferol (VITAMIN D) 2000 units CAPS Take 1 capsule (2,000 Units total) by mouth daily. 04/18/17   [name], MD  diltiazem (CARDIZEM) 60 MG tablet TAKE 1 TABLET BY MOUTH EVERY 6 HOURS AS NEEDED FOR SENSATION OF RAPID HEARTBEAT 04/18/17   Sherran Needs, NP  diltiazem (CARDIZEM) 60 MG tablet TAKE 1 TABLET BY MOUTH EVERY 6 HOURS AS NEEDED FOR SENSATION OF RAPID HEARTBEAT 04/17/17   Carmin Muskrat, MD  flecainide (  TAMBOCOR) 100 MG tablet Take 1 tablet (100 mg total) by mouth 2 (two) times daily. 09/30/16   Barrett, Evelene Croon, PA-C  fluticasone (FLONASE) 50 MCG/ACT nasal spray Place 1 spray into both nostrils daily. 04/15/17 04/15/18  [name], MD  losartan (COZAAR) 25 MG tablet Take 1 tablet (25 mg total) by mouth daily. 04/15/17   [name], MD  polyethylene glycol powder (GLYCOLAX/MIRALAX) powder Mix 17g of Miralax in 8 ounces of water every other day 01/24/17   Armbruster, Carlota Raspberry, MD  potassium chloride SA  (K-DUR,KLOR-CON) 10 MEQ tablet Take 2 tablets (20 mEq total) by mouth daily. 11/12/16   Sherran Needs, NP  ranitidine (ZANTAC) 150 MG tablet TAKE 1 TABLET BY MOUTH TWICE DAILY Patient taking differently: TAKE 150 MG BY MOUTH TWICE DAILY AS NEEDED FOR ACID REFLUX 03/16/17   Axel Filler, MD  rivaroxaban (XARELTO) 20 MG TABS tablet Take 1 tablet (20 mg total) by mouth daily with supper. 03/15/17   Sherran Needs, NP  senna-docusate (SENNA S) 8.6-50 MG tablet Take 1 tablet by mouth 2 (two) times daily. Patient not taking: Reported on 04/17/2017 02/16/17   [name], MD  sodium chloride (OCEAN) 0.65 % SOLN nasal spray Place 1 spray into both nostrils as needed for congestion. 04/15/17   [name], MD  tiotropium (SPIRIVA HANDIHALER) 18 MCG inhalation capsule INHALE THE CONTENTS OF 1 CAPSULE(18 MCG) VIA INHALATION DEVICE EVERY DAY 02/16/17   [name], MD    Family History Family History  Problem Relation Age of Onset  . Heart attack Mother        [name] age  . Breast cancer Sister        Late 74s; now s/p mastectomy   . Lung cancer Brother        x 2  . Cancer Sister   . Cancer Sister   . Cancer Sister   . Colon cancer Neg Hx   . Colon polyps Neg Hx   . Esophageal cancer Neg Hx   . Rectal cancer Neg Hx   . Stomach cancer Neg Hx     Social History Social History  Substance Use Topics  . Smoking status: Former Smoker    Types: Cigarettes    Quit date: 10/24/2015  . Smokeless tobacco: Never Used  . Alcohol use No     Allergies   Protonix [pantoprazole sodium] and Celecoxib   Review of Systems Review of Systems  All other systems reviewed and are negative.    Physical Exam Updated Vital Signs BP (!) 148/101 (BP Location: Right Arm)   Pulse 77   Temp 98 F (36.7 C) (Oral)   Resp 12   Ht 1.702 m (5\' 7" )   Wt 74.8 kg (165 lb)   BMI 25.84 kg/m   Physical Exam  Constitutional: She is oriented to person, place, and time. She appears well-developed and  well-nourished.  Non-toxic appearance. No distress.  HENT:  Head: Normocephalic and atraumatic.    Eyes: Pupils are equal, round, and reactive to light. Conjunctivae, EOM and lids are normal.  Neck: Normal range of motion. Neck supple. No tracheal deviation present. No thyroid mass present.  Cardiovascular: Normal rate, regular rhythm and normal heart sounds.  Exam reveals no gallop.   No murmur heard. Pulmonary/Chest: Effort normal. No stridor. No respiratory distress. She has decreased breath sounds in the right lower field and the left lower field. She has wheezes in the right lower field and the left lower  field. She has no rhonchi. She has no rales.  Abdominal: Soft. Normal appearance and bowel sounds are normal. She exhibits no distension. There is no tenderness. There is no rebound and no CVA tenderness.  Musculoskeletal: Normal range of motion. She exhibits no edema or tenderness.  Neurological: She is alert and oriented to person, place, and time. She has normal strength. No cranial nerve deficit or sensory deficit. GCS eye subscore is 4. GCS verbal subscore is 5. GCS motor subscore is 6.  Skin: Skin is warm and dry. Rash noted. No abrasion noted.  Psychiatric: She has a normal mood and affect. Her speech is normal and behavior is normal.  Nursing note and vitals reviewed.    ED Treatments / Results  Labs (all labs ordered are listed, but only abnormal results are displayed) Labs Reviewed - No data to display  EKG  EKG Interpretation None       Radiology No results found.  Procedures Procedures (including critical care time)  Medications Ordered in ED Medications  predniSONE (DELTASONE) tablet 60 mg (not administered)  diphenhydrAMINE (BENADRYL) injection 50 mg (not administered)  albuterol (PROVENTIL) (2.5 MG/3ML) 0.083% nebulizer solution 5 mg (not administered)     Initial Impression / Assessment and Plan / ED Course  I have reviewed the triage vital signs  and the nursing notes.  Pertinent labs & imaging results that were available during my care of the patient were reviewed by me and considered in my medical decision making (see chart for details).     Patient treated for allergic reaction here feels better. Also treated for bronchospasm likely from her baseline history of asthma. Be prescribed prednisone as well as given albuterol inhaler to go home with  Final Clinical Impressions(s) / ED Diagnoses   Final diagnoses:  None    New Prescriptions New Prescriptions   No medications on file     Lacretia Leigh, MD 04/24/17 1017

## 2017-04-24 NOTE — ED Triage Notes (Signed)
Patient complains of ongoing cough and congestion. Has ben taking flonase and allergy meds for same. Also colored her hair on Friday and has itching around scalp and ears, NAD

## 2017-04-24 NOTE — ED Provider Notes (Signed)
[facility] DEPT Provider Note   CSN: [id number] Arrival date & time: 04/24/17  1348     History   Chief Complaint Chief Complaint  Patient presents with  . Atrial Fibrillation  . Headache    HPI [name] is a 66 y.o. female.  HPI   66 year old female with a history of paroxysmalial fibrillation on xarelto, htn, presents with concern for palpitations.  Reports she presented this morning to the ED after using a hair dye on Friday and beginning to have severe pruritis around her hair, burning pain.  Reports she was coughing all of last night and has hx of asthma.  She denies fevers. She received albuterol neb, given rx for prednisone and benadryl for her wheezing and pruritic rash. She returned home around 12 PM, and approximately 12:30, she developed a sensation of rapid heart rate, palpitations, with some associated mild chest discomfort and fatigue. Reports she is able to tell when her heart rate increases and symptoms she is having now are consistent with her afib with RVR. She took diltiazem at 1PM, and came to ED because it is still rapid.  Has been seen in the past in the ED for it and received medications that helped control it.     Past Medical History:  Diagnosis Date  . A-fib ([place])   . Allergy    seasonal  . Asthma   . Chronic anticoagulation   . GERD (gastroesophageal reflux disease)   . Hypertension   . PAF (paroxysmal atrial fibrillation) ([place]) 11/26/2015   April/May 2017: Multiple ED visits for A. Fib.  02/12/16: Improved on diltiazem IV. Cardiology consulted in the ED and discharge patient with diltiazem XR 120 mg once daily with 60 mg by mouth every 6 hours as needed for symptomatic palpitations, flecainide 50 mg twice daily. Referred to the atrial fibrillation clinic.  02/17/16: Seen in the atrial fibrillation clinic by NP Roderic Palau. Normal sin    Patient Active Problem List   Diagnosis Date Noted  . Osteoporosis without current pathological fracture  04/15/2017  . Common cold 04/15/2017  . Constipation 10/19/2016  . Normocytic anemia 10/19/2016  . Rectal bleeding 10/19/2016  . Excessive cerumen in both ear canals 09/10/2016  . Hyperlipidemia 03/18/2016  . Special screening for malignant neoplasms, colon 03/17/2016  . Eczema 03/17/2016  . Cervical cancer screening 03/17/2016  . Assistance with transportation 03/17/2016  . Hypertension   . GERD (gastroesophageal reflux disease)   . PAF (paroxysmal atrial fibrillation) ([place]) 11/26/2015  . Chronic anticoagulation 11/26/2015  . Folliculitis 96/29/5284  . Marijuana abuse 06/11/2015  . Asthma, mild intermittent     Past Surgical History:  Procedure Laterality Date  . TONSILLECTOMY    . TUBAL LIGATION      OB History    No data available       Home Medications    Prior to Admission medications   Medication Sig Start Date End Date Taking? Authorizing Provider  albuterol (PROVENTIL HFA) 108 (90 Base) MCG/ACT inhaler Inhale 2 puffs into the lungs every 4 (four) hours as needed for wheezing or shortness of breath. 04/04/17  Yes [name], MD  CARTIA XT 120 MG 24 hr capsule TAKE 1 CAPSULE(120 MG) BY MOUTH DAILY Patient taking differently: TAKE 1 CAPSULE(120 MG) BY MOUTH evening 12/17/16  Yes Sherran Needs, NP  Cholecalciferol (VITAMIN D) 2000 units CAPS Take 1 capsule (2,000 Units total) by mouth daily. 04/18/17  Yes [name], MD  diltiazem (CARDIZEM) 60 MG tablet  TAKE 1 TABLET BY MOUTH EVERY 6 HOURS AS NEEDED FOR SENSATION OF RAPID HEARTBEAT Patient taking differently: Take 60 mg by mouth as needed. TAKE 1 TABLET BY MOUTH EVERY 6 HOURS AS NEEDED FOR SENSATION OF RAPID HEARTBEAT 04/17/17  Yes Carmin Muskrat, MD  flecainide (TAMBOCOR) 100 MG tablet Take 1 tablet (100 mg total) by mouth 2 (two) times daily. 09/30/16  Yes Barrett, Evelene Croon, PA-C  losartan (COZAAR) 25 MG tablet Take 1 tablet (25 mg total) by mouth daily. 04/15/17  Yes [name], MD  polyethylene glycol  powder (GLYCOLAX/MIRALAX) powder Mix 17g of Miralax in 8 ounces of water every other day 01/24/17  Yes Armbruster, Carlota Raspberry, MD  potassium chloride SA (K-DUR,KLOR-CON) 10 MEQ tablet Take 2 tablets (20 mEq total) by mouth daily. 11/12/16  Yes Sherran Needs, NP  ranitidine (ZANTAC) 150 MG tablet TAKE 1 TABLET BY MOUTH TWICE DAILY Patient taking differently: TAKE 150 MG BY MOUTH TWICE DAILY AS NEEDED FOR ACID REFLUX 03/16/17  Yes Axel Filler, MD  rivaroxaban (XARELTO) 20 MG TABS tablet Take 1 tablet (20 mg total) by mouth daily with supper. 03/15/17  Yes Sherran Needs, NP  tiotropium (SPIRIVA HANDIHALER) 18 MCG inhalation capsule INHALE THE CONTENTS OF 1 CAPSULE(18 MCG) VIA INHALATION DEVICE EVERY DAY 02/16/17  Yes [name], MD  atorvastatin (LIPITOR) 40 MG tablet Take 1 tablet (40 mg total) by mouth daily. Patient not taking: Reported on 04/24/2017 05/17/16 05/17/17  [name], MD  calamine lotion Apply 1 application topically as needed for itching. 04/24/17   Gareth Morgan, MD  Cetirizine HCl 10 MG CAPS Take 1 capsule (10 mg total) by mouth every morning. Patient not taking: Reported on 04/24/2017 04/15/17   [name], MD  cetirizine-pseudoephedrine (ZYRTEC-D) 5-120 MG tablet Take 1 tablet by mouth daily. Patient not taking: Reported on 04/24/2017 04/17/17   Carmin Muskrat, MD  diltiazem (CARDIZEM) 60 MG tablet TAKE 1 TABLET BY MOUTH EVERY 6 HOURS AS NEEDED FOR SENSATION OF RAPID HEARTBEAT Patient not taking: Reported on 04/24/2017 04/18/17   Sherran Needs, NP  fluticasone [facility]) 50 MCG/ACT nasal spray Place 1 spray into both nostrils daily. Patient not taking: Reported on 04/24/2017 04/15/17 04/15/18  [name], MD  predniSONE (STERAPRED UNI-PAK 21 TAB) 10 MG (21) TBPK tablet Take by mouth daily. Take 6 tabs by mouth daily  for 2 days, then 5 tabs for 2 days, then 4 tabs for 2 days, then 3 tabs for 2 days, 2 tabs for 2 days, then 1 tab by mouth daily for 2 days Patient not  taking: Reported on 04/24/2017 04/24/17   Lacretia Leigh, MD  senna-docusate (SENNA S) 8.6-50 MG tablet Take 1 tablet by mouth 2 (two) times daily. Patient not taking: Reported on 04/17/2017 02/16/17   [name], MD  sodium chloride (OCEAN) 0.65 % SOLN nasal spray Place 1 spray into both nostrils as needed for congestion. Patient not taking: Reported on 04/24/2017 04/15/17   [name], MD  triamcinolone (KENALOG) 0.025 % cream Apply 1 application topically 2 (two) times daily. For 2 weeks. 04/24/17   Gareth Morgan, MD    Family History Family History  Problem Relation Age of Onset  . Heart attack Mother        [name] age  . Breast cancer Sister        Late 40s; now s/p mastectomy   . Lung cancer Brother        x 2  . Cancer Sister   .  Cancer Sister   . Cancer Sister   . Colon cancer Neg Hx   . Colon polyps Neg Hx   . Esophageal cancer Neg Hx   . Rectal cancer Neg Hx   . Stomach cancer Neg Hx     Social History Social History  Substance Use Topics  . Smoking status: Former Smoker    Types: Cigarettes    Quit date: 10/24/2015  . Smokeless tobacco: Never Used  . Alcohol use No     Allergies   Protonix [pantoprazole sodium] and Celecoxib   Review of Systems Review of Systems  Constitutional: Positive for fatigue. Negative for fever.  HENT: Negative for sore throat.   Eyes: Negative for visual disturbance.  Respiratory: Positive for cough and shortness of breath (mild). Wheezing: reports last night denies now.   Cardiovascular: Positive for chest pain (mild) and palpitations.  Gastrointestinal: Negative for abdominal pain, nausea and vomiting.  Genitourinary: Negative for difficulty urinating.  Musculoskeletal: Negative for back pain and neck pain.  Skin: Positive for rash.  Neurological: Positive for light-headedness. Negative for syncope and headaches.     Physical Exam Updated Vital Signs BP 133/80 (BP Location: Left Arm)   Pulse 68   Temp 98.4 F (36.9 C)  (Oral)   Resp (!) 24   Ht 5\' 7"  (1.702 m)   Wt 74.8 kg (165 lb)   SpO2 98%   BMI 25.84 kg/m   Physical Exam  Constitutional: She is oriented to person, place, and time. She appears well-developed and well-nourished. No distress.  HENT:  Head: Normocephalic and atraumatic.  Eyes: Conjunctivae and EOM are normal.  Neck: Normal range of motion.  Cardiovascular: Normal heart sounds and intact distal pulses.  An irregularly irregular rhythm present. Tachycardia present.  Exam reveals no gallop and no friction rub.   No murmur heard. Pulmonary/Chest: Effort normal and breath sounds normal. No respiratory distress. She has no wheezes. She has no rales.  Abdominal: Soft. She exhibits no distension. There is no tenderness. There is no guarding.  Musculoskeletal: She exhibits no edema or tenderness.  Neurological: She is alert and oriented to person, place, and time.  Skin: Skin is warm and dry. Rash noted. Rash is papular (waxy papules, clear drainage, crusting) and pustular. She is not diaphoretic. No erythema.  Nursing note and vitals reviewed.       ED Treatments / Results  Labs (all labs ordered are listed, but only abnormal results are displayed) Labs Reviewed  BASIC METABOLIC PANEL - Abnormal; Notable for the following:       Result Value   Glucose, Bld 162 (*)    All other components within normal limits  CBC - Abnormal; Notable for the following:    RDW 16.0 (*)    All other components within normal limits    EKG  EKG Interpretation  Date/Time:  Sunday April 24 2017 13:58:22 EDT Ventricular Rate:  165 PR Interval:    QRS Duration: 88 QT Interval:  250 QTC Calculation: 414 R Axis:   40 Text Interpretation:  Atrial fibrillation with rapid ventricular response Nonspecific ST and T wave abnormality Abnormal ECG Since prior ECG, rate has increased, patient is now in atrial fibrillation with RVR Confirmed by Gareth Morgan [phone]) on 04/24/2017 2:26:38 PM        Radiology Dg Chest 2 View  Result Date: 04/24/2017 CLINICAL DATA:  Atrial fibrillation.  Allergic reaction to hair dye. EXAM: CHEST  2 VIEW COMPARISON:  None. FINDINGS: The heart size  is normal. Mild lower lobe airspace opacities are present. The lungs are hyperinflated. There is no edema or effusion. No significant airspace consolidation is present otherwise. IMPRESSION: 1. Stable hyperinflation and minimal bibasilar opacities, likely reflecting atelectasis or scarring. Electronically Signed   By: San Morelle M.D.   On: 04/24/2017 14:43    Procedures Procedures (including critical care time)  Medications Ordered in ED Medications  metoprolol tartrate (LOPRESSOR) injection 5 mg (5 mg Intravenous Given 04/24/17 1514)  metoprolol tartrate (LOPRESSOR) injection 5 mg (5 mg Intravenous Given 04/24/17 1639)  diltiazem (CARDIZEM) tablet 30 mg (30 mg Oral Given 04/24/17 1625)  ketorolac (TORADOL) 30 MG/ML injection 30 mg (30 mg Intravenous Given 04/24/17 1639)  CRITICAL CARE: afib RVR, multiple doses IV medication Performed by: Alvino Chapel   Total critical care time: 30 minutes  Critical care time was exclusive of separately billable procedures and treating other patients.  Critical care was necessary to treat or prevent imminent or life-threatening deterioration.  Critical care was time spent personally by me on the following activities: development of treatment plan with patient and/or surrogate as well as nursing, discussions with consultants, evaluation of patient's response to treatment, examination of patient, obtaining history from patient or surrogate, ordering and performing treatments and interventions, ordering and review of laboratory studies, ordering and review of radiographic studies, pulse oximetry and re-evaluation of patient's condition.    Initial Impression / Assessment and Plan / ED Course  I have reviewed the triage vital signs and the nursing  notes.  Pertinent labs & imaging results that were available during my care of the patient were reviewed by me and considered in my medical decision making (see chart for details).     66 year old female with a history of paroxysmalial fibrillation on xarelto, htn, presents with concern for palpitations after being seen in the ED for contact dermatitis due to hair dye and wheezing/cough and receiving albuterol and prednisone.  Presents with atrial fibrillation with RVR with a rate in the 170s, normal blood pressures. She was given 5 mg of IV metoprolol without change. She is given additional 5 mg of IV metoprolol, 30 mg of oral diltiazem with conversion to normal sinus rhythm with a rate in the 60s.  She ambulated without return of atrial fibrillation and reports that associated symptoms have improved. Doubt ACS/PE given no continuing symptoms of CP or dyspnea after conversion. Suspect afib RVR secondary to albuterol.    Patient improved with therapies, stable for discharge, continuing oral afib regimen and close cardiology follow up.  Examined rash, feel hx and physical most consistent with allergic contact dermatitis from hair dye. Do not see signs of cellulitis or abscess. Not in dermatoma distribution.  Recommend oral prednisone as rx by Dr. Zenia Resides earlier today, and gave rx for calamine lotion and triamcinolone.  Patient discharged in stable condition with understanding of reasons to return.   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   TRIAMCINOLONE (KENALOG) 0.025 % CREAM    Apply 1 application topically 2 (two) times daily. For 2 weeks.     Gareth Morgan, MD 04/24/17 [phone]

## 2017-04-24 NOTE — ED Triage Notes (Signed)
Pt. Stated, I left here around 1200 from an allergic reaction from hair color and was given steroids to take by mouth and Benadryl shot in my arm. I went into A-fib around 100 and I called the nurse she said come back here. I can feel it going in and out .

## 2017-04-25 ENCOUNTER — Encounter (HOSPITAL_COMMUNITY): Payer: Self-pay | Admitting: Emergency Medicine

## 2017-04-25 ENCOUNTER — Emergency Department (HOSPITAL_COMMUNITY)
Admission: EM | Admit: 2017-04-25 | Discharge: 2017-04-25 | Disposition: A | Discharge status: Home / Self Care | Payer: Medicare Other | Attending: Emergency Medicine | Admitting: Emergency Medicine

## 2017-04-25 DIAGNOSIS — I10 Essential (primary) hypertension: Secondary | ICD-10-CM | POA: Insufficient documentation

## 2017-04-25 DIAGNOSIS — H02843 Edema of right eye, unspecified eyelid: Secondary | ICD-10-CM | POA: Diagnosis not present

## 2017-04-25 DIAGNOSIS — H5789 Other specified disorders of eye and adnexa: Secondary | ICD-10-CM

## 2017-04-25 DIAGNOSIS — Z87891 Personal history of nicotine dependence: Secondary | ICD-10-CM | POA: Insufficient documentation

## 2017-04-25 DIAGNOSIS — H578 Other specified disorders of eye and adnexa: Secondary | ICD-10-CM | POA: Diagnosis not present

## 2017-04-25 DIAGNOSIS — Z79899 Other long term (current) drug therapy: Secondary | ICD-10-CM | POA: Diagnosis not present

## 2017-04-25 DIAGNOSIS — Z7901 Long term (current) use of anticoagulants: Secondary | ICD-10-CM | POA: Diagnosis not present

## 2017-04-25 DIAGNOSIS — J45909 Unspecified asthma, uncomplicated: Secondary | ICD-10-CM | POA: Insufficient documentation

## 2017-04-25 NOTE — ED Provider Notes (Signed)
Patient seen here yesterday for "allergic reaction to hair dye" complains of itching in her scalp. Today she presents with right-sided infraorbital swelling which is nonpainful. On exam she has mild left-sided infraorbital edema which appears to be fluid. Area is not red or tender. She has no pain on extraocular movement and no visual changes. She is not ill-appearing   Orlie Dakin, MD 04/25/17 1536

## 2017-04-25 NOTE — ED Triage Notes (Signed)
Pt states she believes she is having an allergic reaction to her hair dye. She states she was seen here yesterday and sent home on prednisone and benadryl. Pt woke up this morning and has swelling around her right eye. Vision intact.

## 2017-04-25 NOTE — Discharge Instructions (Signed)
Please apply ice to right eye for 15-20 minutes up to 3-4 times a day. Applying the ice will help with the swelling.  Please follow-up with your beauty salon to discuss having the hair dye removed since you had an allergic reaction to it. If you decide to dye your hair in the future, please do a patch test prior to dying your whole head to ensure that your don't have any reaction to the dye.

## 2017-04-25 NOTE — ED Notes (Signed)
MD Jacubowitz at the bedside   

## 2017-04-25 NOTE — ED Provider Notes (Signed)
[facility] DEPT [facility] Provider Note   CSN: [id number] Arrival date & time: 04/25/17  1123     History   Chief Complaint Chief Complaint  Patient presents with  . Eye Problem    HPI [name] is a 66 y.o. female with a h/o of Afib on Xarelto who presents to the emergency department with a chief complaint of right eye swelling began this morning when she awoke. She denies pain to the eye or with movement of the eye, eye redness or itching. No left eye complaints, HA, dizziness, chest pain, or dyspnea.   She was seen by her primary care doctor on 04/15/2017 and discharged with cetirizine and Flonase for a URI. She was seen in the emergency department on 04/17/2017 for A. fib with rapid ventricular response and was cardioverted with Cardizem and flecainide. She was seen twice in the emergency department yesterday for pruritus and urticaria that began after she dyed her hair. She was discharged home with Benadryl, Proventil, and prednisone.  The history is provided by the patient. No language interpreter was used.    Past Medical History:  Diagnosis Date  . A-fib ([place])   . Allergy    seasonal  . Asthma   . Chronic anticoagulation   . GERD (gastroesophageal reflux disease)   . Hypertension   . PAF (paroxysmal atrial fibrillation) ([place]) 11/26/2015   April/May 2017: Multiple ED visits for A. Fib.  02/12/16: Improved on diltiazem IV. Cardiology consulted in the ED and discharge patient with diltiazem XR 120 mg once daily with 60 mg by mouth every 6 hours as needed for symptomatic palpitations, flecainide 50 mg twice daily. Referred to the atrial fibrillation clinic.  02/17/16: Seen in the atrial fibrillation clinic by NP Roderic Palau. Normal sin    Patient Active Problem List   Diagnosis Date Noted  . Osteoporosis without current pathological fracture 04/15/2017  . Common cold 04/15/2017  . Constipation 10/19/2016  . Normocytic anemia 10/19/2016  . Rectal bleeding 10/19/2016  .  Excessive cerumen in both ear canals 09/10/2016  . Hyperlipidemia 03/18/2016  . Special screening for malignant neoplasms, colon 03/17/2016  . Eczema 03/17/2016  . Cervical cancer screening 03/17/2016  . Assistance with transportation 03/17/2016  . Hypertension   . GERD (gastroesophageal reflux disease)   . PAF (paroxysmal atrial fibrillation) ([place]) 11/26/2015  . Chronic anticoagulation 11/26/2015  . Folliculitis 44/81/8563  . Marijuana abuse 06/11/2015  . Asthma, mild intermittent     Past Surgical History:  Procedure Laterality Date  . TONSILLECTOMY    . TUBAL LIGATION      OB History    No data available       Home Medications    Prior to Admission medications   Medication Sig Start Date End Date Taking? Authorizing Provider  albuterol (PROVENTIL HFA) 108 (90 Base) MCG/ACT inhaler Inhale 2 puffs into the lungs every 4 (four) hours as needed for wheezing or shortness of breath. 04/04/17   [name], MD  calamine lotion Apply 1 application topically as needed for itching. 04/24/17   Gareth Morgan, MD  CARTIA XT 120 MG 24 hr capsule TAKE 1 CAPSULE(120 MG) BY MOUTH DAILY Patient taking differently: TAKE 1 CAPSULE(120 MG) BY MOUTH evening 12/17/16   Sherran Needs, NP  cetirizine-pseudoephedrine (ZYRTEC-D) 5-120 MG tablet Take 1 tablet by mouth daily. Patient not taking: Reported on 04/24/2017 04/17/17   Carmin Muskrat, MD  Cholecalciferol (VITAMIN D) 2000 units CAPS Take 1 capsule (2,000 Units total) by mouth  daily. 04/18/17   [name], MD  flecainide (TAMBOCOR) 100 MG tablet Take 1 tablet (100 mg total) by mouth 2 (two) times daily. 09/30/16   Barrett, Evelene Croon, PA-C  fluticasone (FLONASE) 50 MCG/ACT nasal spray Place 1 spray into both nostrils daily. Patient not taking: Reported on 04/24/2017 04/15/17 04/15/18  [name], MD  losartan (COZAAR) 25 MG tablet Take 1 tablet (25 mg total) by mouth daily. 04/15/17   [name], MD  polyethylene glycol powder  (GLYCOLAX/MIRALAX) powder Mix 17g of Miralax in 8 ounces of water every other day 01/24/17   Armbruster, Carlota Raspberry, MD  potassium chloride SA (K-DUR,KLOR-CON) 10 MEQ tablet Take 2 tablets (20 mEq total) by mouth daily. 11/12/16   Sherran Needs, NP  predniSONE (STERAPRED UNI-PAK 21 TAB) 10 MG (21) TBPK tablet Take by mouth daily. Take 6 tabs by mouth daily  for 2 days, then 5 tabs for 2 days, then 4 tabs for 2 days, then 3 tabs for 2 days, 2 tabs for 2 days, then 1 tab by mouth daily for 2 days Patient not taking: Reported on 04/24/2017 04/24/17   Lacretia Leigh, MD  ranitidine (ZANTAC) 150 MG tablet TAKE 1 TABLET BY MOUTH TWICE DAILY Patient taking differently: TAKE 150 MG BY MOUTH TWICE DAILY AS NEEDED FOR ACID REFLUX 03/16/17   Axel Filler, MD  rivaroxaban (XARELTO) 20 MG TABS tablet Take 1 tablet (20 mg total) by mouth daily with supper. 03/15/17   Sherran Needs, NP  senna-docusate (SENNA S) 8.6-50 MG tablet Take 1 tablet by mouth 2 (two) times daily. Patient not taking: Reported on 04/17/2017 02/16/17   [name], MD  sodium chloride (OCEAN) 0.65 % SOLN nasal spray Place 1 spray into both nostrils as needed for congestion. Patient not taking: Reported on 04/24/2017 04/15/17   [name], MD  tiotropium (SPIRIVA HANDIHALER) 18 MCG inhalation capsule INHALE THE CONTENTS OF 1 CAPSULE(18 MCG) VIA INHALATION DEVICE EVERY DAY 02/16/17   [name], MD  triamcinolone (KENALOG) 0.025 % cream Apply 1 application topically 2 (two) times daily. For 2 weeks. 04/24/17   Gareth Morgan, MD    Family History Family History  Problem Relation Age of Onset  . Heart attack Mother        [name] age  . Breast cancer Sister        Late 74s; now s/p mastectomy   . Lung cancer Brother        x 2  . Cancer Sister   . Cancer Sister   . Cancer Sister   . Colon cancer Neg Hx   . Colon polyps Neg Hx   . Esophageal cancer Neg Hx   . Rectal cancer Neg Hx   . Stomach cancer Neg Hx     Social  History Social History  Substance Use Topics  . Smoking status: Former Smoker    Types: Cigarettes    Quit date: 10/24/2015  . Smokeless tobacco: Never Used  . Alcohol use No     Allergies   Protonix [pantoprazole sodium] and Celecoxib   Review of Systems Review of Systems  Constitutional: Negative for activity change.  HENT: Positive for facial swelling.   Eyes: Negative for pain, discharge, redness and itching.  Respiratory: Negative for shortness of breath.   Cardiovascular: Negative for chest pain.  Gastrointestinal: Negative for abdominal pain.  Musculoskeletal: Negative for back pain.  Skin: Negative for rash.  Neurological: Negative for headaches.     Physical Exam Updated  Vital Signs BP (!) 144/81 (BP Location: Left Arm)   Pulse 70   Temp 97.8 F (36.6 C) (Oral)   Resp 16   Ht 5\' 7"  (1.702 m)   Wt 72.6 kg (160 lb)   SpO2 99%   BMI 25.06 kg/m   Physical Exam  Constitutional: No distress.  HENT:  Head: Normocephalic.    Infraorbital edema to the right eye. No edema to the left eye. No warmth or erythema.  Eyes: Pupils are equal, round, and reactive to light. Conjunctivae, EOM and lids are normal. Right eye exhibits no discharge. Left eye exhibits no discharge.  Neck: Neck supple.  Cardiovascular: Normal rate and regular rhythm.  Exam reveals no gallop and no friction rub.   No murmur heard. Pulmonary/Chest: Effort normal. No respiratory distress.  Abdominal: Soft. She exhibits no distension.  Neurological: She is alert.  Skin: Skin is warm. No rash noted.  Psychiatric: Her behavior is normal.  Nursing note and vitals reviewed.  ED Treatments / Results  Labs (all labs ordered are listed, but only abnormal results are displayed) Labs Reviewed - No data to display  EKG  EKG Interpretation None       Radiology No results found.  Procedures Procedures (including critical care time)  Medications Ordered in ED Medications - No data to  display   Initial Impression / Assessment and Plan / ED Course  I have reviewed the triage vital signs and the nursing notes.  Pertinent labs & imaging results that were available during my care of the patient were reviewed by me and considered in my medical decision making (see chart for details).     66 year old female presenting with infraorbital edema to the right eye. The patient was seen and evaluated in the ED yesterday and was prescribed prednisone for a reaction to dying her hair.The patient was seen and evaluated by Dr. Winfred Leeds, attending physician. No left eye swelling. EOM and PERRL. No signs of infection. Encouraged cool compresses and home and following up with her salon to have the hair dye removed since the patient continues to scratch her head. She is not ill appearing. NAD. VSS. Strict return precautions given. The patient is safe for d/c at this time.   Final Clinical Impressions(s) / ED Diagnoses   Final diagnoses:  Eye swelling, right    New Prescriptions Discharge Medication List as of 04/25/2017  3:29 PM       Joline Maxcy A, PA-C 04/26/17 1458    Orlie Dakin, MD 04/26/17 1750

## 2017-04-27 ENCOUNTER — Ambulatory Visit (HOSPITAL_COMMUNITY)
Admission: RE | Admit: 2017-04-27 | Discharge: 2017-04-27 | Disposition: A | Discharge status: Home / Self Care | Payer: Medicare Other | Source: Ambulatory Visit | Attending: Nurse Practitioner | Admitting: Nurse Practitioner

## 2017-04-27 ENCOUNTER — Encounter (HOSPITAL_COMMUNITY): Payer: Self-pay | Admitting: Nurse Practitioner

## 2017-04-27 ENCOUNTER — Other Ambulatory Visit: Payer: Self-pay | Admitting: Internal Medicine

## 2017-04-27 ENCOUNTER — Other Ambulatory Visit: Payer: Self-pay

## 2017-04-27 ENCOUNTER — Ambulatory Visit (INDEPENDENT_AMBULATORY_CARE_PROVIDER_SITE_OTHER): Payer: Medicare Other | Admitting: Internal Medicine

## 2017-04-27 ENCOUNTER — Encounter: Payer: Self-pay | Admitting: Internal Medicine

## 2017-04-27 ENCOUNTER — Telehealth: Payer: Self-pay

## 2017-04-27 VITALS — BP 149/100 | HR 70 | Temp 98.0°F | Ht 67.0 in | Wt 159.5 lb

## 2017-04-27 VITALS — BP 166/100 | HR 60 | Ht 67.0 in | Wt 158.4 lb

## 2017-04-27 DIAGNOSIS — Z7901 Long term (current) use of anticoagulants: Secondary | ICD-10-CM | POA: Diagnosis not present

## 2017-04-27 DIAGNOSIS — L2389 Allergic contact dermatitis due to other agents: Secondary | ICD-10-CM

## 2017-04-27 DIAGNOSIS — I480 Paroxysmal atrial fibrillation: Secondary | ICD-10-CM | POA: Insufficient documentation

## 2017-04-27 DIAGNOSIS — Z79899 Other long term (current) drug therapy: Secondary | ICD-10-CM | POA: Insufficient documentation

## 2017-04-27 DIAGNOSIS — Z87891 Personal history of nicotine dependence: Secondary | ICD-10-CM | POA: Insufficient documentation

## 2017-04-27 DIAGNOSIS — K219 Gastro-esophageal reflux disease without esophagitis: Secondary | ICD-10-CM | POA: Insufficient documentation

## 2017-04-27 DIAGNOSIS — I10 Essential (primary) hypertension: Secondary | ICD-10-CM | POA: Diagnosis not present

## 2017-04-27 DIAGNOSIS — I4891 Unspecified atrial fibrillation: Secondary | ICD-10-CM | POA: Diagnosis present

## 2017-04-27 DIAGNOSIS — E876 Hypokalemia: Secondary | ICD-10-CM | POA: Insufficient documentation

## 2017-04-27 DIAGNOSIS — L239 Allergic contact dermatitis, unspecified cause: Secondary | ICD-10-CM | POA: Insufficient documentation

## 2017-04-27 MED ORDER — FLECAINIDE ACETATE 100 MG PO TABS: 100.0000 mg | ORAL_TABLET | Freq: Two times a day (BID) | ORAL | 6 refills | Status: DC

## 2017-04-27 MED ORDER — FEXOFENADINE HCL 30 MG PO TABS: 30.0000 mg | ORAL_TABLET | Freq: Every day | ORAL | 0 refills | Status: DC

## 2017-04-27 NOTE — Addendum Note (Signed)
Addended by: Aldine Contes on: 04/27/2017 03:32 PM   Modules accepted: Level of Service

## 2017-04-27 NOTE — Assessment & Plan Note (Signed)
Assessment:  The patient's diffuse rash with pruritis, distribution of rash, infraorbital edema, and timing of symptoms are consistent with allergic contact dermatitis due to the hair dye product the patient used.   Plan:  -Continue Prednisone taper as prescribed by ED provider -Change Benadryl to Fexofenadine 30 mg daily and up to twice daily for severe itching  -Triamcinolone cream to most itchy areas of scalp, neck, and behind ears, was told to avoid application to the face -Reassured the patient it will take several days for for the prednisone to work and for her symptoms to improve

## 2017-04-27 NOTE — Progress Notes (Signed)
Internal Medicine Clinic Attending  I saw and evaluated the patient.  I personally confirmed the key portions of the history and exam documented by Dr. LaCroce and I reviewed pertinent patient test results.  The assessment, diagnosis, and plan were formulated together and I agree with the documentation in the resident's note.  

## 2017-04-27 NOTE — Telephone Encounter (Signed)
NEEDS REFILL OF FEXOFENADINE 30MG . TO [name] [phone]
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
## 2017-04-27 NOTE — Telephone Encounter (Signed)
This was sent after pt's appt this am, pt called and informed

## 2017-04-27 NOTE — Patient Instructions (Addendum)
[name],   You had a severe allergic reaction to hair dye.   Please continue taking the Prednisone that was prescribed to you by the emergency department. This will help with your eye swelling and itching.  I have also prescribed you Fexofenadine for itching. Take 30 mg of Fexofenadine once daily until your symptoms have improved. You can take up to 2 pills daily if one pill does not help your itching.   Please return to clinic if your symptoms do not improve.

## 2017-04-27 NOTE — Progress Notes (Addendum)
CC: Scalp rash  HPI:  [name] is a 66 y.o. with past medical history as documented below presenting for follow up at several emergency department visits within the past few days due to a scalp rash. On 04/24/2017 she went to the ED after using store bought/boxed hair dye that she had never used before and subsequently experienced scalp burning with intense pruritis. She also had associated cough and wheezing. The ED discharged her with a prednisone taper, benadryl, triamcinolone ointment and a Proventil inhaler. The following day she returned to the emergency department because she woke up with infraorbital right eye swelling, as well concern that the benadryl or prednisone she was prescribed was sending her into a fib. The ED discharged her with recommendations to ice her eye and continue the prednisone and benadryl that was previously prescribed.   She continues to have pruritis of the scalp, as well as infraorbital eye swelling. This morning when she woke up she noticed that her left eye also had new onset infraorbital swelling, but not as severe as her right.  She denies nausea, vomiting, diarrhea, wheezing, shortness of breath, chest pain, or palpitations. She has taken one 60 mg dose of the of the prednisone taper and continues to take benadryl but does not feel any improvement of her symptoms.   Past Medical History:  Diagnosis Date  . A-fib ([place])   . Allergy    seasonal  . Asthma   . Chronic anticoagulation   . GERD (gastroesophageal reflux disease)   . Hypertension   . PAF (paroxysmal atrial fibrillation) ([place]) 11/26/2015   April/May 2017: Multiple ED visits for A. Fib.  02/12/16: Improved on diltiazem IV. Cardiology consulted in the ED and discharge patient with diltiazem XR 120 mg once daily with 60 mg by mouth every 6 hours as needed for symptomatic palpitations, flecainide 50 mg twice daily. Referred to the atrial fibrillation clinic.  02/17/16: Seen in the atrial fibrillation  clinic by NP Roderic Palau. Normal sin   Review of Systems:   Review of Systems  Constitutional: Negative for chills and fever.  HENT: Negative.   Eyes: Negative for blurred vision, double vision, photophobia, pain, discharge and redness.  Respiratory: Negative for cough, shortness of breath and wheezing.   Cardiovascular: Negative for chest pain and palpitations.  Gastrointestinal: Negative for diarrhea, nausea and vomiting.  Genitourinary: Negative.   Musculoskeletal: Negative.   Skin: Positive for itching and rash.  Neurological: Negative.    Physical Exam:  Vitals:   04/27/17 1036  BP: (!) 149/100  Pulse: 70  Temp: 98 F (36.7 C)  TempSrc: Oral  SpO2: 100%  Weight: 159 lb 8 oz (72.3 kg)  Height: 5\' 7"  (1.702 m)   Physical Exam  Constitutional: She is oriented to person, place, and time. She appears well-developed and well-nourished.  HENT:  Head: Normocephalic and atraumatic. Head abrasion:   Scalp:  Diffuse inflamed, papular, erythematous, oozing rash difficult to fully characterize due to hair   Ears:   Papular, raised non-erythematous behind ears bilaterally non-oozing  Eyes: Pupils are equal, round, and reactive to light. Conjunctivae and EOM are normal.  Bilateral infraorbital swelling, non-erythematous, no warmth; right greater than left  Neck: Normal range of motion. Neck supple.  Neck:  Papular, raised non-erythematous rash at nape of neck, non-oozing   Cardiovascular: Normal rate, regular rhythm and normal heart sounds.   Pulmonary/Chest: Effort normal and breath sounds normal.  Abdominal: Soft. Bowel sounds are normal.  Musculoskeletal: Normal  range of motion.  Neurological: She is alert and oriented to person, place, and time.  Skin: Rash noted.  Psychiatric: She has a normal mood and affect. Her behavior is normal.    Assessment & Plan:   See Encounters Tab for problem based charting.  Patient seen with Dr. Dareen Piano

## 2017-04-27 NOTE — Addendum Note (Signed)
Addended by: Melanee Spry on: 04/27/2017 02:22 PM   Modules accepted: Level of Service

## 2017-04-27 NOTE — Telephone Encounter (Signed)
Needs to speak with a nurse about meds. Please call pt back.  

## 2017-04-28 ENCOUNTER — Telehealth: Payer: Self-pay

## 2017-04-28 NOTE — Telephone Encounter (Signed)
Needs to speak with a nurse about meds. Please call pt back.  

## 2017-04-29 ENCOUNTER — Encounter: Payer: Self-pay | Admitting: Internal Medicine

## 2017-04-30 ENCOUNTER — Emergency Department (HOSPITAL_COMMUNITY)
Admission: EM | Admit: 2017-04-30 | Discharge: 2017-04-30 | Disposition: A | Discharge status: Home / Self Care | Payer: Medicare Other | Attending: Emergency Medicine | Admitting: Emergency Medicine

## 2017-04-30 ENCOUNTER — Encounter (HOSPITAL_COMMUNITY): Payer: Self-pay | Admitting: Emergency Medicine

## 2017-04-30 DIAGNOSIS — Z87891 Personal history of nicotine dependence: Secondary | ICD-10-CM | POA: Insufficient documentation

## 2017-04-30 DIAGNOSIS — J45909 Unspecified asthma, uncomplicated: Secondary | ICD-10-CM | POA: Diagnosis not present

## 2017-04-30 DIAGNOSIS — I10 Essential (primary) hypertension: Secondary | ICD-10-CM | POA: Diagnosis not present

## 2017-04-30 DIAGNOSIS — Z7901 Long term (current) use of anticoagulants: Secondary | ICD-10-CM | POA: Insufficient documentation

## 2017-04-30 DIAGNOSIS — Z79899 Other long term (current) drug therapy: Secondary | ICD-10-CM | POA: Insufficient documentation

## 2017-04-30 DIAGNOSIS — R21 Rash and other nonspecific skin eruption: Secondary | ICD-10-CM | POA: Diagnosis present

## 2017-04-30 DIAGNOSIS — T7840XD Allergy, unspecified, subsequent encounter: Secondary | ICD-10-CM

## 2017-04-30 MED ORDER — DIPHENHYDRAMINE HCL 50 MG/ML IJ SOLN
25.0000 mg | Freq: Once | INTRAMUSCULAR | Status: AC
  Administered 2017-04-30: 25 mg via INTRAMUSCULAR
  Filled 2017-04-30: qty 1

## 2017-04-30 MED ORDER — CLOBETASOL PROPIONATE 0.05 % EX CREA: 1.0000 "application " | TOPICAL_CREAM | Freq: Two times a day (BID) | CUTANEOUS | 0 refills | Status: DC

## 2017-04-30 MED ORDER — HYDROXYZINE HCL 25 MG PO TABS: 25.0000 mg | ORAL_TABLET | Freq: Four times a day (QID) | ORAL | 0 refills | Status: DC

## 2017-04-30 NOTE — Discharge Instructions (Signed)
Continue Prednisone Start Clobetasol cream on scalp twice daily Start Hydroxyzine as needed for itching Follow up with your primary doctor

## 2017-04-30 NOTE — ED Provider Notes (Signed)
[facility] DEPT Provider Note   CSN: [id number] Arrival date & time: 04/30/17  0330     History   Chief Complaint Chief Complaint  Patient presents with  . Allergic Reaction    HPI Shelby BESSE is a 66 y.o. female who presents with an allergic reaction. PMH significant for PAF on chronic anticoagulation. She has been seen for this problem in the ED three times in the past week and once by primary care. She states that her rash is getting better slowly but she is still having intense itching. She has been taking Prednisone, Triamcinolone, and Allegra with minimal relief. She states that last night she was up all night scratching so she came to the ED. She has had the hair dye washed out at a salon.  HPI  Past Medical History:  Diagnosis Date  . A-fib ([place])   . Allergy    seasonal  . Asthma   . Chronic anticoagulation   . GERD (gastroesophageal reflux disease)   . Hypertension   . PAF (paroxysmal atrial fibrillation) ([place]) 11/26/2015   April/May 2017: Multiple ED visits for A. Fib.  02/12/16: Improved on diltiazem IV. Cardiology consulted in the ED and discharge patient with diltiazem XR 120 mg once daily with 60 mg by mouth every 6 hours as needed for symptomatic palpitations, flecainide 50 mg twice daily. Referred to the atrial fibrillation clinic.  02/17/16: Seen in the atrial fibrillation clinic by NP Roderic Palau. Normal sin    Patient Active Problem List   Diagnosis Date Noted  . Allergic contact dermatitis 04/27/2017  . Osteoporosis without current pathological fracture 04/15/2017  . Common cold 04/15/2017  . Constipation 10/19/2016  . Normocytic anemia 10/19/2016  . Rectal bleeding 10/19/2016  . Excessive cerumen in both ear canals 09/10/2016  . Hyperlipidemia 03/18/2016  . Special screening for malignant neoplasms, colon 03/17/2016  . Eczema 03/17/2016  . Cervical cancer screening 03/17/2016  . Assistance with transportation 03/17/2016  . Hypertension   . GERD  (gastroesophageal reflux disease)   . PAF (paroxysmal atrial fibrillation) ([place]) 11/26/2015  . Chronic anticoagulation 11/26/2015  . Folliculitis 19/14/7829  . Marijuana abuse 06/11/2015  . Asthma, mild intermittent     Past Surgical History:  Procedure Laterality Date  . TONSILLECTOMY    . TUBAL LIGATION      OB History    No data available       Home Medications    Prior to Admission medications   Medication Sig Start Date End Date Taking? Authorizing Provider  albuterol (PROVENTIL HFA) 108 (90 Base) MCG/ACT inhaler Inhale 2 puffs into the lungs every 4 (four) hours as needed for wheezing or shortness of breath. 04/04/17   [name], MD  calamine lotion Apply 1 application topically as needed for itching. 04/24/17   Gareth Morgan, MD  CARTIA XT 120 MG 24 hr capsule TAKE 1 CAPSULE(120 MG) BY MOUTH DAILY Patient taking differently: TAKE 1 CAPSULE(120 MG) BY MOUTH evening 12/17/16   Sherran Needs, NP  Cholecalciferol (VITAMIN D) 2000 units CAPS Take 1 capsule (2,000 Units total) by mouth daily. 04/18/17   [name], MD  diltiazem (CARDIZEM) 60 MG tablet  04/17/17   [provider]  fexofenadine (ALLEGRA) 30 MG tablet Take 1 tablet (30 mg total) by mouth daily. 04/27/17   Lacroce, Hulen Shouts, MD  flecainide (TAMBOCOR) 100 MG tablet Take 1 tablet (100 mg total) by mouth 2 (two) times daily. 04/27/17   Melanee Spry, MD  fluticasone (  FLONASE) 50 MCG/ACT nasal spray Place 1 spray into both nostrils daily. 04/15/17 04/15/18  [name], MD  losartan (COZAAR) 25 MG tablet Take 1 tablet (25 mg total) by mouth daily. 04/15/17   [name], MD  polyethylene glycol powder (GLYCOLAX/MIRALAX) powder Mix 17g of Miralax in 8 ounces of water every other day 01/24/17   Armbruster, Carlota Raspberry, MD  potassium chloride SA (K-DUR,KLOR-CON) 10 MEQ tablet Take 2 tablets (20 mEq total) by mouth daily. 11/12/16   Sherran Needs, NP  predniSONE (DELTASONE) 10 MG tablet  04/24/17   [provider]  predniSONE (STERAPRED UNI-PAK 21 TAB) 10 MG (21) TBPK tablet Take by mouth daily. Take 6 tabs by mouth daily  for 2 days, then 5 tabs for 2 days, then 4 tabs for 2 days, then 3 tabs for 2 days, 2 tabs for 2 days, then 1 tab by mouth daily for 2 days 04/24/17   Lacretia Leigh, MD  ranitidine (ZANTAC) 150 MG tablet TAKE 1 TABLET BY MOUTH TWICE DAILY Patient taking differently: TAKE 150 MG BY MOUTH TWICE DAILY AS NEEDED FOR ACID REFLUX 03/16/17   Axel Filler, MD  rivaroxaban (XARELTO) 20 MG TABS tablet Take 1 tablet (20 mg total) by mouth daily with supper. 03/15/17   Sherran Needs, NP  senna-docusate (SENNA S) 8.6-50 MG tablet Take 1 tablet by mouth 2 (two) times daily. 02/16/17   [name], MD  tiotropium (SPIRIVA HANDIHALER) 18 MCG inhalation capsule INHALE THE CONTENTS OF 1 CAPSULE(18 MCG) VIA INHALATION DEVICE EVERY DAY 02/16/17   [name], MD  triamcinolone (KENALOG) 0.025 % cream Apply 1 application topically 2 (two) times daily. For 2 weeks. 04/24/17   Gareth Morgan, MD    Family History Family History  Problem Relation Age of Onset  . Heart attack Mother        [name] age  . Breast cancer Sister        Late 44s; now s/p mastectomy   . Lung cancer Brother        x 2  . Cancer Sister   . Cancer Sister   . Cancer Sister   . Colon cancer Neg Hx   . Colon polyps Neg Hx   . Esophageal cancer Neg Hx   . Rectal cancer Neg Hx   . Stomach cancer Neg Hx     Social History Social History  Substance Use Topics  . Smoking status: Former Smoker    Types: Cigarettes    Quit date: 10/24/2015  . Smokeless tobacco: Never Used  . Alcohol use No     Allergies   Protonix [pantoprazole sodium] and Celecoxib   Review of Systems Review of Systems  Respiratory: Negative for shortness of breath.   Cardiovascular: Negative for chest pain.  Skin: Positive for rash.       +itching      Physical Exam Updated Vital Signs BP (!) 188/100 (BP Location: Right  Arm)   Pulse 68   Temp 98.2 F (36.8 C) (Oral)   Resp 18   Ht 5\' 7"  (5.329 m)   Wt 72.6 kg (160 lb)   SpO2 100%   BMI 25.06 kg/m   Physical Exam  Constitutional: She is oriented to person, place, and time. She appears well-developed and well-nourished. No distress.  HENT:  Head: Normocephalic and atraumatic.  Eyes: Pupils are equal, round, and reactive to light. Conjunctivae are normal. Right eye exhibits no discharge. Left eye exhibits no discharge. No scleral  icterus.  Neck: Normal range of motion.  Cardiovascular: Normal rate.   Pulmonary/Chest: Effort normal. No respiratory distress.  Abdominal: She exhibits no distension.  Neurological: She is alert and oriented to person, place, and time.  Skin: Skin is warm and dry. Rash (erythema over abdomen) noted.  Scalp is bumpy and flaky in several areas. Erythema noted over ears and posterior scalp  Psychiatric: She has a normal mood and affect. Her behavior is normal.  Nursing note and vitals reviewed.    ED Treatments / Results  Labs (all labs ordered are listed, but only abnormal results are displayed) Labs Reviewed - No data to display  EKG  EKG Interpretation None       Radiology No results found.  Procedures Procedures (including critical care time)  Medications Ordered in ED Medications  diphenhydrAMINE (BENADRYL) injection 25 mg (25 mg Intramuscular Given 04/30/17 0724)     Initial Impression / Assessment and Plan / ED Course  I have reviewed the triage vital signs and the nursing notes.  Pertinent labs & imaging results that were available during my care of the patient were reviewed by me and considered in my medical decision making (see chart for details).  66 year old female presents with ongoing allergic reaction. She states that overall the rash is getting better but she has been unable to alleviate the itching. I advised her to continue her prednisone taper, stop Allegra, start Atarax, and will rx  Clobetasol cream for scalp only. Benadryl IM shot given in the ED. Advised f/u with PCP.  Final Clinical Impressions(s) / ED Diagnoses   Final diagnoses:  Allergic reaction, subsequent encounter    New Prescriptions New Prescriptions   No medications on file     Iris Pert 04/30/17 1255    Varney Biles, MD 04/30/17 2121

## 2017-04-30 NOTE — ED Triage Notes (Signed)
Pt believes she is having a reaction to her hair dye.  States she was seen in ED for same just over a week ago and continues to have redness to bilateral eyes and scalp itching.  States the medication prescribed had been helping until tonight.

## 2017-05-02 ENCOUNTER — Telehealth: Payer: Self-pay | Admitting: Internal Medicine

## 2017-05-02 ENCOUNTER — Other Ambulatory Visit: Payer: Self-pay | Admitting: *Deleted

## 2017-05-02 MED ORDER — CLOBETASOL PROPIONATE 0.05 % EX CREA: 1.0000 "application " | TOPICAL_CREAM | Freq: Two times a day (BID) | CUTANEOUS | 1 refills | Status: DC

## 2017-05-02 MED ORDER — CALAMINE EX LOTN: 1.0000 "application " | TOPICAL_LOTION | CUTANEOUS | 1 refills | Status: DC | PRN

## 2017-05-02 NOTE — Telephone Encounter (Signed)
Pls requesting refill on cream unsure which one, pt states first cream

## 2017-05-02 NOTE — Telephone Encounter (Signed)
Called and sent request

## 2017-05-02 NOTE — Telephone Encounter (Signed)
Pt is calling back to speak with a nurse. 

## 2017-05-02 NOTE — Telephone Encounter (Signed)
Request sent to md for approval

## 2017-05-03 ENCOUNTER — Ambulatory Visit (HOSPITAL_COMMUNITY): Payer: Self-pay | Admitting: Nurse Practitioner

## 2017-05-04 ENCOUNTER — Telehealth: Payer: Self-pay | Admitting: Internal Medicine

## 2017-05-04 NOTE — Telephone Encounter (Signed)
Pls call pt regarding refill medications

## 2017-05-05 ENCOUNTER — Other Ambulatory Visit: Payer: Self-pay | Admitting: *Deleted

## 2017-05-05 MED ORDER — HYDROXYZINE HCL 25 MG PO TABS: 25.0000 mg | ORAL_TABLET | Freq: Four times a day (QID) | ORAL | 0 refills | Status: DC

## 2017-05-05 NOTE — Telephone Encounter (Signed)
Pt is calling back to speak with a nurse about meds.

## 2017-05-05 NOTE — Telephone Encounter (Signed)
It still is listed at the same pharmacy.  Is that the one she wants?  Thanks

## 2017-05-05 NOTE — Telephone Encounter (Signed)
It looks like the original prescription was sent to the [facility] on [address] [facility].  What pharmacy does she want it sent to?

## 2017-05-05 NOTE — Telephone Encounter (Signed)
Please call in

## 2017-05-05 NOTE — Telephone Encounter (Deleted)
Pt stated [facility] on [address]; also needs refill on "the cream".

## 2017-05-05 NOTE — Telephone Encounter (Signed)
I will fill.  She can call back if at wrong pharmacy again.  Maybe they can transfer it this time?

## 2017-05-09 IMAGING — DX DG CHEST 2V
2 series · 2 of 2 positions shown · non-contrast
Comparison: PA and lateral chest 07/01/2015.

CLINICAL DATA: Heart palpitations for over a week. Shortness of
breath and chest tightness. Initial encounter.

EXAM:
CHEST  2 VIEW

[w chest pa]
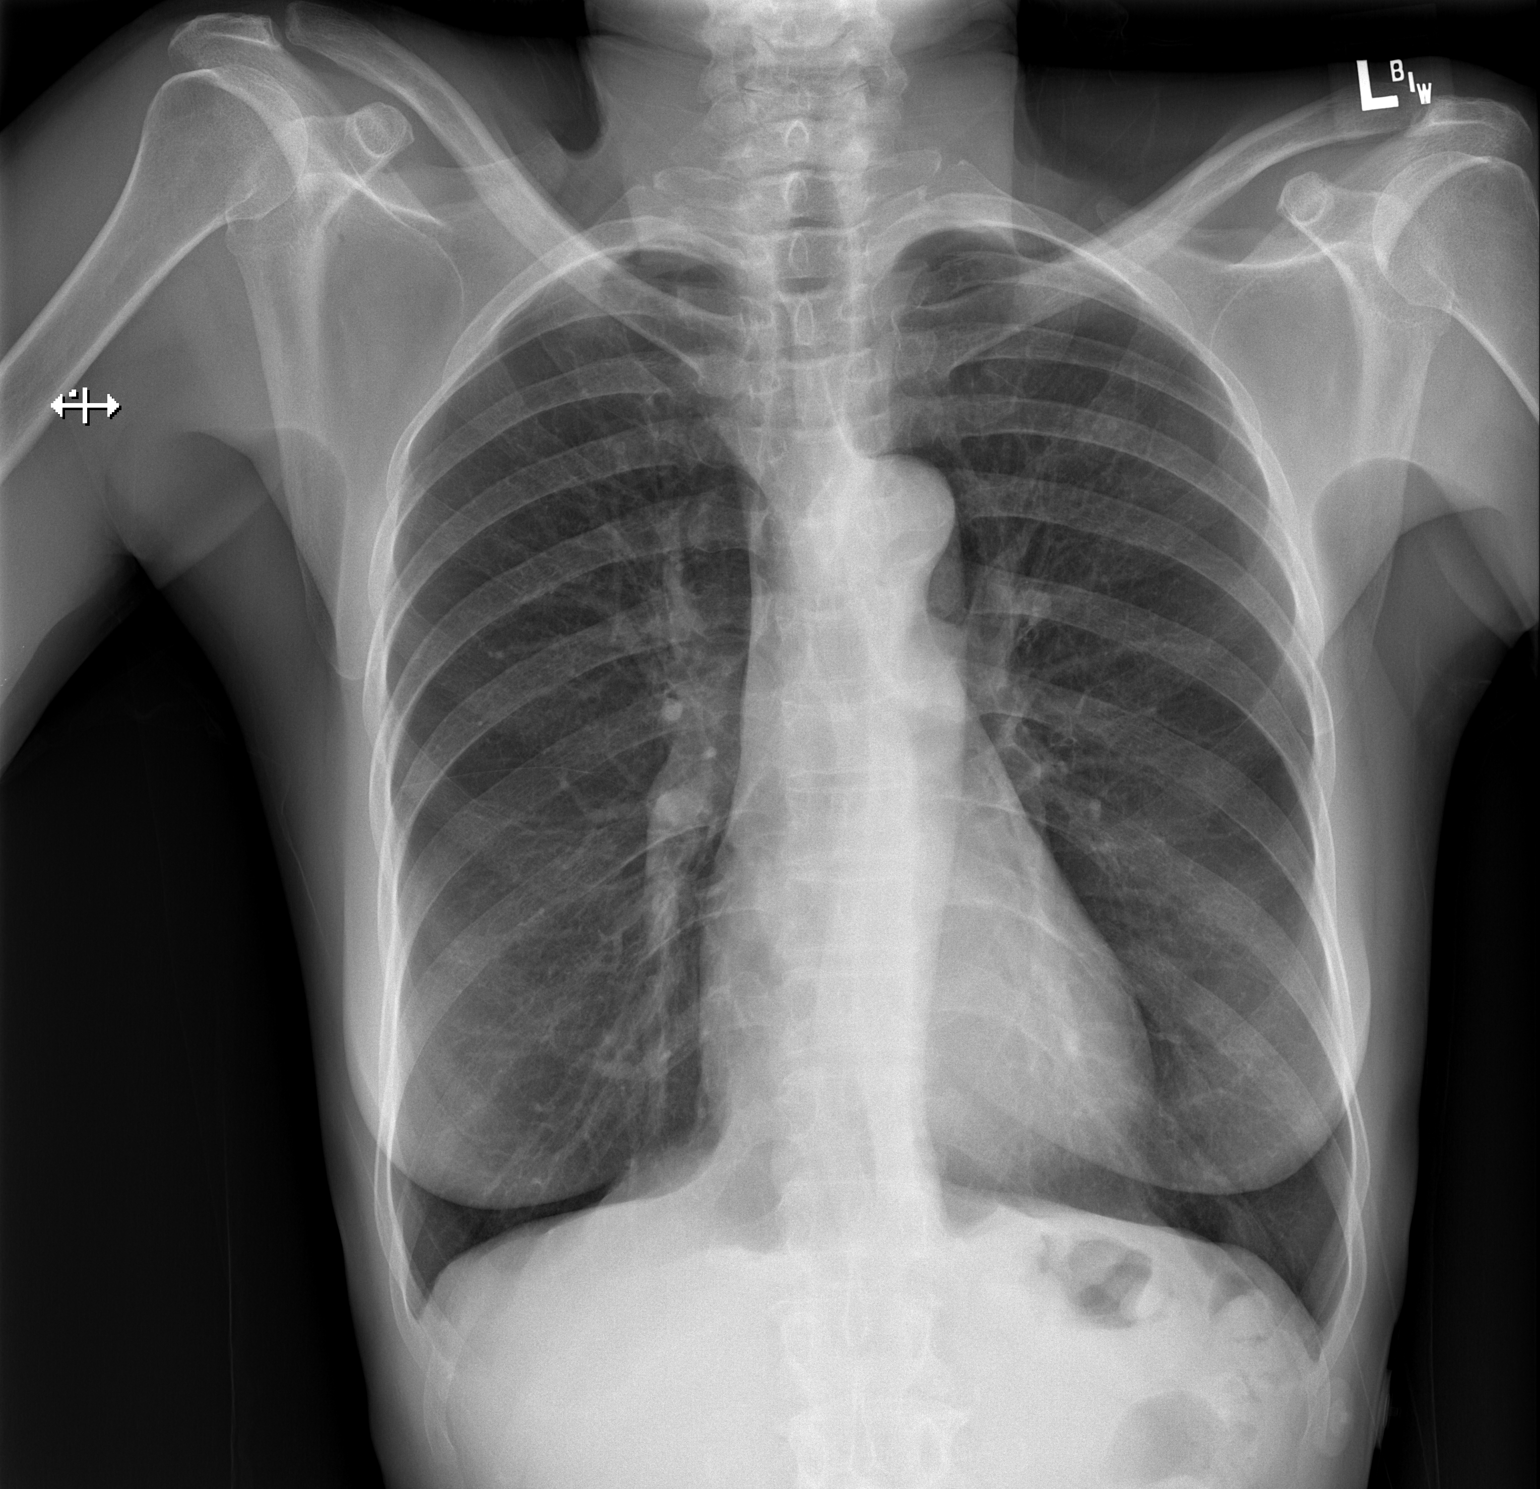

[w chest lat]
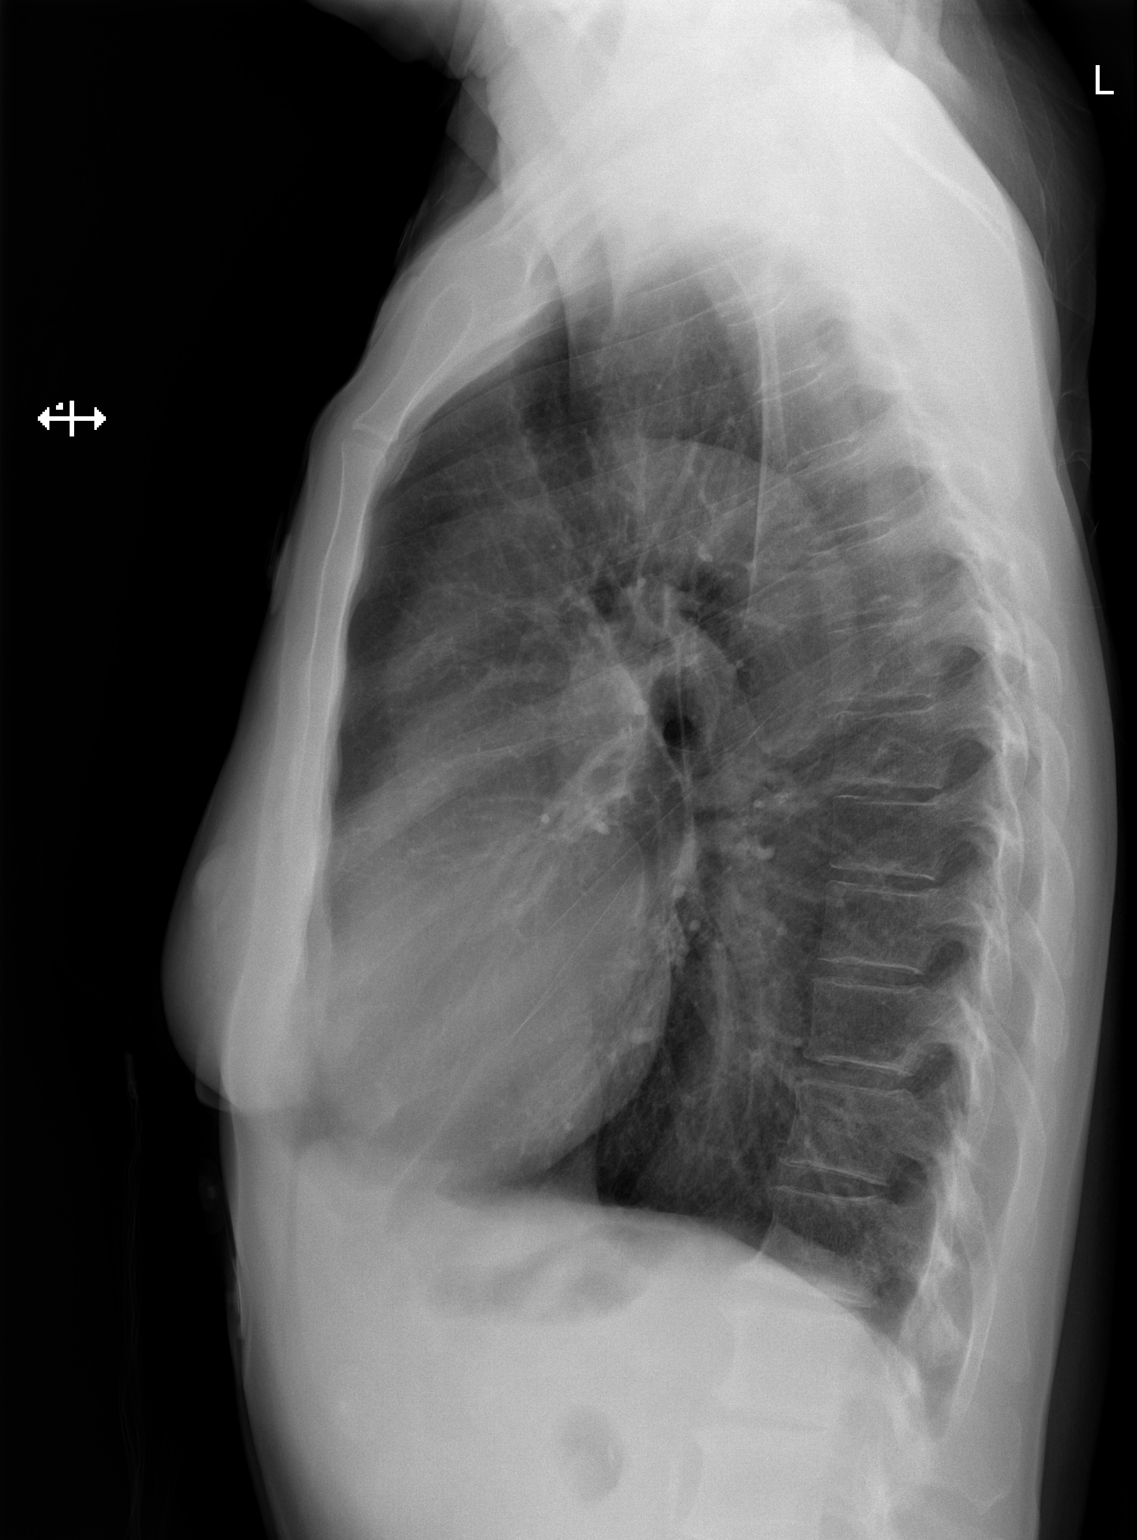

[2 of 2 positions shown; findings below may reference images not displayed]

FINDINGS: The chest is hyperexpanded but the lungs are clear. Heart size is
normal. No pneumothorax or pleural effusion. No focal bony
abnormality.
IMPRESSION: No acute disease.

Bilateral pulmonary hyperexpansion suggestive of COPD.

## 2017-05-13 ENCOUNTER — Encounter: Payer: Self-pay | Admitting: Internal Medicine

## 2017-05-13 DIAGNOSIS — L503 Dermatographic urticaria: Secondary | ICD-10-CM | POA: Diagnosis not present

## 2017-05-13 DIAGNOSIS — L258 Unspecified contact dermatitis due to other agents: Secondary | ICD-10-CM | POA: Diagnosis not present

## 2017-05-19 ENCOUNTER — Encounter: Payer: Self-pay | Admitting: Internal Medicine

## 2017-05-19 ENCOUNTER — Encounter (HOSPITAL_COMMUNITY): Payer: Self-pay | Admitting: Emergency Medicine

## 2017-05-19 ENCOUNTER — Emergency Department (HOSPITAL_COMMUNITY): Payer: Medicare Other

## 2017-05-19 ENCOUNTER — Other Ambulatory Visit: Payer: Self-pay

## 2017-05-19 ENCOUNTER — Emergency Department (HOSPITAL_COMMUNITY)
Admission: EM | Admit: 2017-05-19 | Discharge: 2017-05-19 | Disposition: A | Discharge status: Home / Self Care | Payer: Medicare Other | Attending: Emergency Medicine | Admitting: Emergency Medicine

## 2017-05-19 ENCOUNTER — Ambulatory Visit: Payer: Self-pay

## 2017-05-19 DIAGNOSIS — Z7901 Long term (current) use of anticoagulants: Secondary | ICD-10-CM | POA: Insufficient documentation

## 2017-05-19 DIAGNOSIS — I10 Essential (primary) hypertension: Secondary | ICD-10-CM | POA: Diagnosis not present

## 2017-05-19 DIAGNOSIS — Z79899 Other long term (current) drug therapy: Secondary | ICD-10-CM | POA: Insufficient documentation

## 2017-05-19 DIAGNOSIS — R05 Cough: Secondary | ICD-10-CM

## 2017-05-19 DIAGNOSIS — Z87891 Personal history of nicotine dependence: Secondary | ICD-10-CM | POA: Insufficient documentation

## 2017-05-19 DIAGNOSIS — R000 Tachycardia, unspecified: Secondary | ICD-10-CM | POA: Diagnosis not present

## 2017-05-19 DIAGNOSIS — R059 Cough, unspecified: Secondary | ICD-10-CM

## 2017-05-19 DIAGNOSIS — L299 Pruritus, unspecified: Secondary | ICD-10-CM | POA: Diagnosis not present

## 2017-05-19 DIAGNOSIS — J9801 Acute bronchospasm: Secondary | ICD-10-CM

## 2017-05-19 MED ORDER — PREDNISONE 20 MG PO TABS
60.0000 mg | ORAL_TABLET | Freq: Once | ORAL | Status: AC
  Administered 2017-05-19: 60 mg via ORAL
  Filled 2017-05-19: qty 3

## 2017-05-19 MED ORDER — BENZONATATE 100 MG PO CAPS: 100.0000 mg | ORAL_CAPSULE | Freq: Three times a day (TID) | ORAL | 0 refills | Status: DC | PRN

## 2017-05-19 MED ORDER — ALBUTEROL SULFATE (2.5 MG/3ML) 0.083% IN NEBU
INHALATION_SOLUTION | RESPIRATORY_TRACT | Status: AC
  Filled 2017-05-19: qty 6

## 2017-05-19 MED ORDER — ALBUTEROL SULFATE (2.5 MG/3ML) 0.083% IN NEBU
5.0000 mg | INHALATION_SOLUTION | Freq: Once | RESPIRATORY_TRACT | Status: AC
  Administered 2017-05-19: 5 mg via RESPIRATORY_TRACT

## 2017-05-19 MED ORDER — ALBUTEROL SULFATE HFA 108 (90 BASE) MCG/ACT IN AERS
2.0000 | INHALATION_SPRAY | Freq: Once | RESPIRATORY_TRACT | Status: AC
  Administered 2017-05-19: 2 via RESPIRATORY_TRACT
  Filled 2017-05-19: qty 6.7

## 2017-05-19 MED ORDER — HYDROXYZINE HCL 25 MG PO TABS
25.0000 mg | ORAL_TABLET | Freq: Once | ORAL | Status: AC
  Administered 2017-05-19: 25 mg via ORAL
  Filled 2017-05-19: qty 1

## 2017-05-19 MED ORDER — HYDROXYZINE PAMOATE 25 MG PO CAPS: 25.0000 mg | ORAL_CAPSULE | Freq: Three times a day (TID) | ORAL | 0 refills | Status: DC | PRN

## 2017-05-19 MED ORDER — PREDNISONE 10 MG (21) PO TBPK: ORAL_TABLET | ORAL | 0 refills | Status: DC

## 2017-05-19 NOTE — ED Provider Notes (Signed)
TIME SEEN: 5:24 AM  CHIEF COMPLAINT: cough, rash  HPI: Pt is a 66 y.o. female with history of Paroxysmal atrial fibrillation, hypertension, asthma/emphysema who no longer smokes who presents to the emergency department with 2 separate complaints. States tonight she is had a dry cough and wheezing.States she was coughing so much she felt short of breath. No chest discomfort. No fever. This is improved upon arrival to the emergency department.   Patient also complains of diffuse pruritus for the past several weeks. She states she has been seen by a dermatologist and was started on cimetidine. She has run out of Vistaril which she felt was helping her symptoms. No visible rash at this time. She is not on steroids. No new soaps, lotions, detergents or medications. States she is concerned that she could have bedbugs. No lip or tongue swelling. No difficulty swallowing, speaking or breathing. No fever.    ROS: See HPI Constitutional: no fever  Eyes: no drainage  ENT: no runny nose   Cardiovascular:  no chest pain  Resp: no SOB  GI: no vomiting GU: no dysuria Integumentary: no rash  Allergy: no hives  Musculoskeletal: no leg swelling  Neurological: no slurred speech ROS otherwise negative  PAST MEDICAL HISTORY/PAST SURGICAL HISTORY:  Past Medical History:  Diagnosis Date  . A-fib ([place])   . Allergy    seasonal  . Asthma   . Chronic anticoagulation   . GERD (gastroesophageal reflux disease)   . Hypertension   . PAF (paroxysmal atrial fibrillation) ([place]) 11/26/2015   April/May 2017: Multiple ED visits for A. Fib.  02/12/16: Improved on diltiazem IV. Cardiology consulted in the ED and discharge patient with diltiazem XR 120 mg once daily with 60 mg by mouth every 6 hours as needed for symptomatic palpitations, flecainide 50 mg twice daily. Referred to the atrial fibrillation clinic.  02/17/16: Seen in the atrial fibrillation clinic by NP Roderic Palau. Normal sin    MEDICATIONS:  Prior to  Admission medications   Medication Sig Start Date End Date Taking? Authorizing Provider  albuterol (PROVENTIL HFA) 108 (90 Base) MCG/ACT inhaler Inhale 2 puffs into the lungs every 4 (four) hours as needed for wheezing or shortness of breath. 04/04/17   [name], MD  calamine lotion Apply 1 application topically as needed for itching. 05/02/17   [name], MD  CARTIA XT 120 MG 24 hr capsule TAKE 1 CAPSULE(120 MG) BY MOUTH DAILY Patient taking differently: TAKE 1 CAPSULE(120 MG) BY MOUTH evening 12/17/16   Sherran Needs, NP  Cholecalciferol (VITAMIN D) 2000 units CAPS Take 1 capsule (2,000 Units total) by mouth daily. 04/18/17   [name], MD  clobetasol cream (TEMOVATE) 1.19 % Apply 1 application topically 2 (two) times daily. 05/02/17   [name], MD  diltiazem (CARDIZEM) 60 MG tablet  04/17/17   [provider]  fexofenadine (ALLEGRA) 30 MG tablet Take 1 tablet (30 mg total) by mouth daily. 04/27/17   Lacroce, Hulen Shouts, MD  flecainide (TAMBOCOR) 100 MG tablet Take 1 tablet (100 mg total) by mouth 2 (two) times daily. 04/27/17   Lacroce, Hulen Shouts, MD  fluticasone (FLONASE) 50 MCG/ACT nasal spray Place 1 spray into both nostrils daily. 04/15/17 04/15/18  [name], MD  hydrOXYzine (ATARAX/VISTARIL) 25 MG tablet Take 1 tablet (25 mg total) by mouth every 6 (six) hours. 05/05/17   Sid Falcon, MD  losartan (COZAAR) 25 MG tablet Take 1 tablet (25 mg total) by mouth daily. 04/15/17   Amin,  Sumayya, MD  polyethylene glycol powder (GLYCOLAX/MIRALAX) powder Mix 17g of Miralax in 8 ounces of water every other day 01/24/17   Armbruster, Carlota Raspberry, MD  potassium chloride SA (K-DUR,KLOR-CON) 10 MEQ tablet Take 2 tablets (20 mEq total) by mouth daily. 11/12/16   Sherran Needs, NP  predniSONE (DELTASONE) 10 MG tablet  04/24/17   [provider]  predniSONE (STERAPRED UNI-PAK 21 TAB) 10 MG (21) TBPK tablet Take by mouth daily. Take 6 tabs by mouth daily  for 2 days, then 5 tabs  for 2 days, then 4 tabs for 2 days, then 3 tabs for 2 days, 2 tabs for 2 days, then 1 tab by mouth daily for 2 days 04/24/17   Lacretia Leigh, MD  ranitidine (ZANTAC) 150 MG tablet TAKE 1 TABLET BY MOUTH TWICE DAILY Patient taking differently: TAKE 150 MG BY MOUTH TWICE DAILY AS NEEDED FOR ACID REFLUX 03/16/17   Axel Filler, MD  rivaroxaban (XARELTO) 20 MG TABS tablet Take 1 tablet (20 mg total) by mouth daily with supper. 03/15/17   Sherran Needs, NP  senna-docusate (SENNA S) 8.6-50 MG tablet Take 1 tablet by mouth 2 (two) times daily. 02/16/17   [name], MD  tiotropium (SPIRIVA HANDIHALER) 18 MCG inhalation capsule INHALE THE CONTENTS OF 1 CAPSULE(18 MCG) VIA INHALATION DEVICE EVERY DAY 02/16/17   [name], MD  triamcinolone (KENALOG) 0.025 % cream Apply 1 application topically 2 (two) times daily. For 2 weeks. 04/24/17   Gareth Morgan, MD    ALLERGIES:  Allergies  Allergen Reactions  . Protonix [Pantoprazole Sodium] Other (See Comments)    Heart races  . Celecoxib Rash    SOCIAL HISTORY:  Social History  Substance Use Topics  . Smoking status: Former Smoker    Types: Cigarettes    Quit date: 10/24/2015  . Smokeless tobacco: Never Used  . Alcohol use No    FAMILY HISTORY: Family History  Problem Relation Age of Onset  . Heart attack Mother        [name] age  . Breast cancer Sister        Late 47s; now s/p mastectomy   . Lung cancer Brother        x 2  . Cancer Sister   . Cancer Sister   . Cancer Sister   . Colon cancer Neg Hx   . Colon polyps Neg Hx   . Esophageal cancer Neg Hx   . Rectal cancer Neg Hx   . Stomach cancer Neg Hx     EXAM: BP (!) 159/103 (BP Location: Left Arm)   Pulse 78   Temp 97.8 F (36.6 C) (Oral)   Resp 18   Ht 5\' 7"  (1.702 m)   Wt 74.8 kg (165 lb)   SpO2 97%   BMI 25.84 kg/m  CONSTITUTIONAL: Alert and oriented and responds appropriately to questions. Well-appearing; well-nourished HEAD: Normocephalic EYES:  Conjunctivae clear, pupils appear equal, EOMI ENT: normal nose; moist mucous membranes NECK: Supple, no meningismus, no nuchal rigidity, no LAD  CARD: RRR; S1 and S2 appreciated; no murmurs, no clicks, no rubs, no gallops RESP: Normal chest excursion without splinting or tachypnea; breath sounds clear and equal bilaterally; no wheezes, no rhonchi, no rales, no hypoxia or respiratory distress, speaking full sentences ABD/GI: Normal bowel sounds; non-distended; soft, non-tender, no rebound, no guarding, no peritoneal signs, no hepatosplenomegaly BACK:  The back appears normal and is non-tender to palpation, there is no CVA tenderness EXT: Normal ROM in  all joints; non-tender to palpation; no edema; normal capillary refill; no cyanosis, no calf tenderness or swelling    SKIN: Normal color for age and race; warm; no rash, no blisters or desquamation, no hives, no petechiae or purpura, please does have some areas of excoriation without signs of superimposed infection NEURO: Moves all extremities equally PSYCH: The patient's mood and manner are appropriate. Grooming and personal hygiene are appropriate.  MEDICAL DECISION MAKING: Patient here with cough with bronchospasm. Has history of asthma and emphysema. No longer smokes. She has no oxygen requirement here. Her lungs are now clear. Chest x-ray shows emphysematous changes but no other acute abnormality. We'll give albuterol inhaler and steroid taper for symptomatic relief. We'll also give prescription for [employer]   Patient also complaining of rash. There is no visible rash on exam she does have areas of excoriation from scratching. No other systemic symptoms.  I feel that the steroid taper given to her for her bronchospasm should help with the symptoms if there is any allergic component.She has no sign of cellulitis, abscess. No sign of any life-threatening illness. I have recommended she stop scratching the areas.Nothing that looks like scabies  at this time. I have advised her that if she feels like she could have been exposed to bedbugs that she needs to contact an exterminator, wash all linens in hot water, possibly change her mattress.  She states that she thinks she obtained them from staying with someone else several weeks ago. She has not noticed any bedbugs at home. She has follow-up with a dermatologist. She has asked for a cream to help with her itching. I have recommended over-the-counter Benadryl cream.    At this time, I do not feel there is any life-threatening condition present. I have reviewed and discussed all results (EKG, imaging, lab, urine as appropriate) and exam findings with patient/family. I have reviewed nursing notes and appropriate previous records.  I feel the patient is safe to be discharged home without further emergent workup and can continue workup as an outpatient as needed. Discussed usual and customary return precautions. Patient/family verbalize understanding and are comfortable with this plan.  Outpatient follow-up has been provided if needed. All questions have been answered.      Flordia Kassem, Delice Bison, DO 05/19/17 [phone]

## 2017-05-19 NOTE — ED Triage Notes (Signed)
Pt also states she wants a rash on her back looked at

## 2017-05-19 NOTE — Telephone Encounter (Signed)
Called pt regarding refill request - no answer; left message to call back if she has questions.

## 2017-05-19 NOTE — ED Triage Notes (Signed)
BIB EMS from home, pt reports cough X several days, congested. Pt has no other complaints. Pt given breathing tx in triage for comfort.

## 2017-05-19 NOTE — Discharge Instructions (Signed)
Please follow-up with your dermatologist.   To find a primary care or specialty doctor please call [phone] or [phone] to access "[facility] Service."  You may also go on the [facility] website at [email or web address]  There are also multiple [facility], [facility], [facility] and [facility] practices throughout the [place] that are frequently accepting new patients. You may find a clinic that is close to your home and contact them.  [place] [id number] [place]  [place] [address] [place] [phone]

## 2017-05-20 NOTE — Telephone Encounter (Signed)
When called pt had a script from dr Cyril Mourning ward and dr Reesa Chew, declined

## 2017-05-21 ENCOUNTER — Other Ambulatory Visit: Payer: Self-pay | Admitting: Internal Medicine

## 2017-05-23 ENCOUNTER — Other Ambulatory Visit: Payer: Self-pay | Admitting: Internal Medicine

## 2017-05-23 NOTE — Telephone Encounter (Signed)
Patient is requesting as callback regarding refills

## 2017-05-23 NOTE — Telephone Encounter (Signed)
Called pt - no answer; left message to give us a call back. 

## 2017-05-23 NOTE — Telephone Encounter (Signed)
WANTS NURSE TO CALL HER [phone]

## 2017-05-23 NOTE — Telephone Encounter (Signed)
Received refill request from pt's pharmacy for hydroxyzine 25mg  tabs #12.  Pt received #30 from the 09/27 ED visit.  Refill request denied.  Pt also needs an appt to be seen and a letter has been mailed to her regarding her missed appts. Despina Hidden Cassady10/1/20189:38 AM

## 2017-05-23 NOTE — Telephone Encounter (Signed)
Called pt-no answer. HIPPA compliant message left on recorder.Despina Hidden Cassady10/1/20184:45 PM

## 2017-05-24 ENCOUNTER — Other Ambulatory Visit: Payer: Self-pay | Admitting: Internal Medicine

## 2017-05-24 NOTE — Telephone Encounter (Signed)
Pls return call, pt missed call yesterday

## 2017-05-24 NOTE — Telephone Encounter (Signed)
Pt contacted by nurse.. See additional phone note for info.  Regenia Skeeter, Josselyne Onofrio Cassady10/2/201810:16 AM

## 2017-05-24 NOTE — Telephone Encounter (Signed)
Requesting a refill on Hydroxyzine for itching; states  this is the only medication that helps (other meds are too strong).

## 2017-05-25 NOTE — Telephone Encounter (Signed)
Requesting to speak with a nurse about something. Please call pt back.  °

## 2017-05-26 MED ORDER — HYDROXYZINE HCL 25 MG PO TABS: 25.0000 mg | ORAL_TABLET | Freq: Four times a day (QID) | ORAL | 0 refills | Status: DC | PRN

## 2017-05-26 NOTE — Telephone Encounter (Signed)
Hydroxyzine called in to [facility] store# [phone]

## 2017-05-28 IMAGING — DX DG CHEST 2V
2 series · 2 of 2 positions shown · non-contrast
Comparison: 01/22/2016 and earlier.

CLINICAL DATA: 65-year-old female with chest pain and tachycardia
since noon today. Initial encounter.

EXAM:
CHEST  2 VIEW

[chest pa]
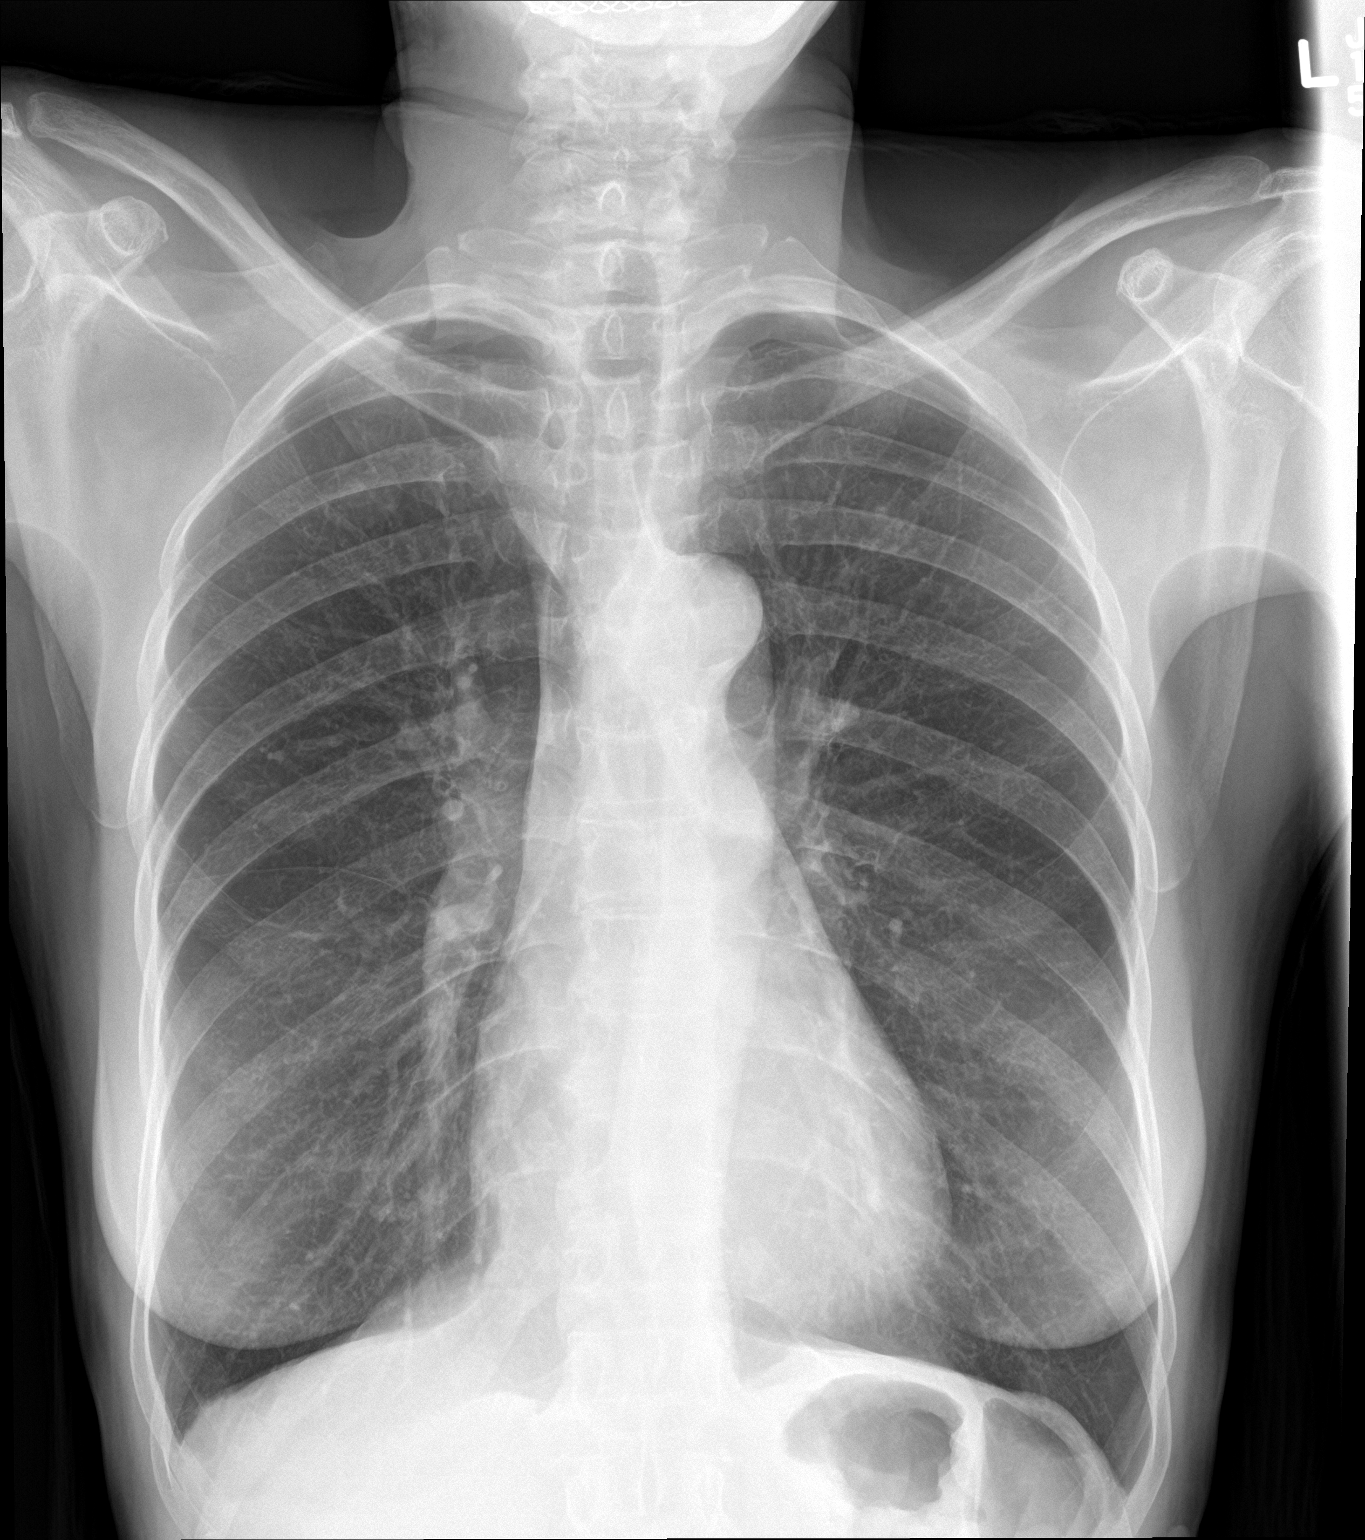

[chest lat]
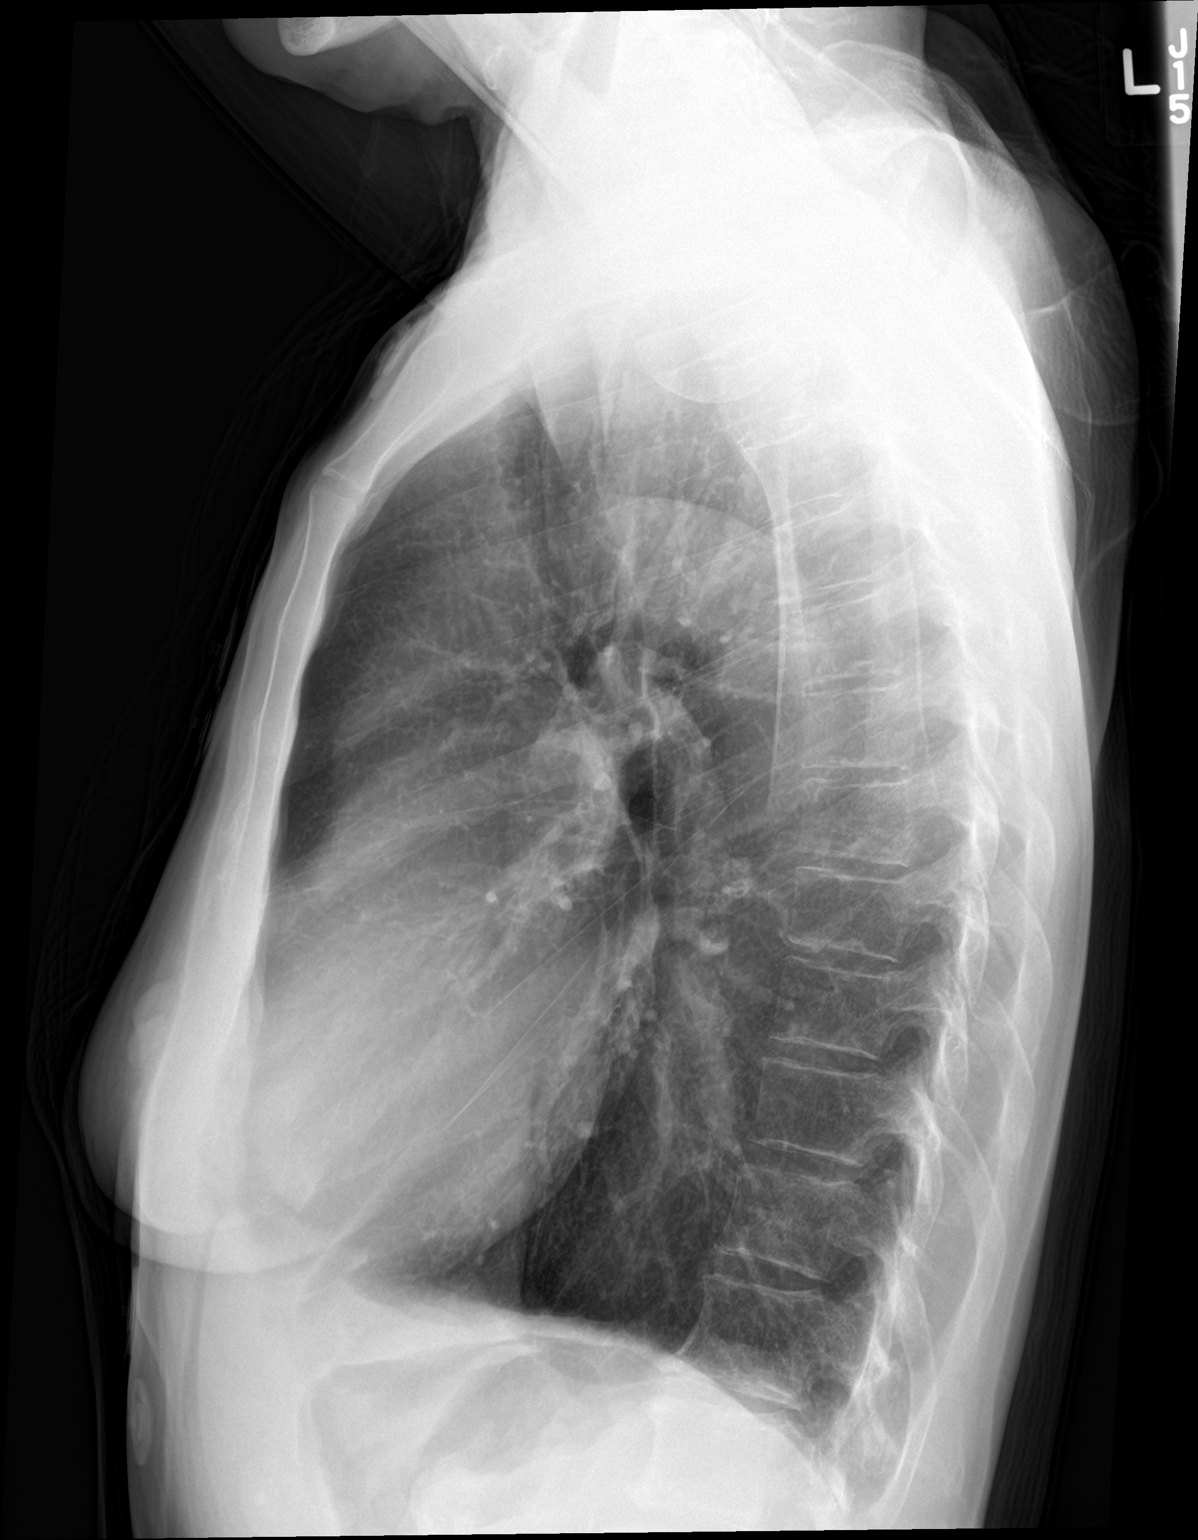

[2 of 2 positions shown; findings below may reference images not displayed]

FINDINGS: Stable mild cardiomegaly. Other mediastinal contours are within
normal limits. Visualized tracheal air column is within normal
limits. No pneumothorax, pulmonary edema, pleural effusion or
confluent pulmonary opacity. Chronic hyperinflation appears stable
since 9840. No acute osseous abnormality identified.
IMPRESSION: Chronic hyperinflation and mild cardiomegaly. No acute
cardiopulmonary abnormality.

## 2017-05-29 ENCOUNTER — Emergency Department (HOSPITAL_COMMUNITY): Payer: Medicare Other

## 2017-05-29 ENCOUNTER — Encounter (HOSPITAL_COMMUNITY): Payer: Self-pay | Admitting: *Deleted

## 2017-05-29 ENCOUNTER — Emergency Department (HOSPITAL_COMMUNITY)
Admission: EM | Admit: 2017-05-29 | Discharge: 2017-05-29 | Disposition: A | Discharge status: Home / Self Care | Payer: Medicare Other | Attending: Emergency Medicine | Admitting: Emergency Medicine

## 2017-05-29 DIAGNOSIS — I10 Essential (primary) hypertension: Secondary | ICD-10-CM | POA: Insufficient documentation

## 2017-05-29 DIAGNOSIS — Z87891 Personal history of nicotine dependence: Secondary | ICD-10-CM | POA: Diagnosis not present

## 2017-05-29 DIAGNOSIS — I700 Atherosclerosis of aorta: Secondary | ICD-10-CM | POA: Diagnosis not present

## 2017-05-29 DIAGNOSIS — R0781 Pleurodynia: Secondary | ICD-10-CM | POA: Diagnosis not present

## 2017-05-29 DIAGNOSIS — R0789 Other chest pain: Secondary | ICD-10-CM | POA: Insufficient documentation

## 2017-05-29 DIAGNOSIS — J4520 Mild intermittent asthma, uncomplicated: Secondary | ICD-10-CM | POA: Diagnosis not present

## 2017-05-29 DIAGNOSIS — R079 Chest pain, unspecified: Secondary | ICD-10-CM | POA: Diagnosis present

## 2017-05-29 DIAGNOSIS — Z7901 Long term (current) use of anticoagulants: Secondary | ICD-10-CM | POA: Insufficient documentation

## 2017-05-29 DIAGNOSIS — Z79899 Other long term (current) drug therapy: Secondary | ICD-10-CM | POA: Insufficient documentation

## 2017-05-29 DIAGNOSIS — R0602 Shortness of breath: Secondary | ICD-10-CM | POA: Insufficient documentation

## 2017-05-29 LAB — I-STAT TROPONIN, ED: Troponin i, poc: 0.01 ng/mL (ref 0.00–0.08)

## 2017-05-29 LAB — CBC
HCT: 39.4 % (ref 36.0–46.0)
HEMOGLOBIN: 12.9 g/dL (ref 12.0–15.0)
MCH: 26.8 pg (ref 26.0–34.0)
MCHC: 32.7 g/dL (ref 30.0–36.0)
MCV: 81.7 fL (ref 78.0–100.0)
PLATELETS: 261 10*3/uL (ref 150–400)
RBC: 4.82 MIL/uL (ref 3.87–5.11)
RDW: 15.7 % — AB (ref 11.5–15.5)
WBC: 4.2 10*3/uL (ref 4.0–10.5)

## 2017-05-29 LAB — BASIC METABOLIC PANEL
Anion gap: 9 (ref 5–15)
BUN: 8 mg/dL (ref 6–20)
CHLORIDE: 105 mmol/L (ref 101–111)
CO2: 28 mmol/L (ref 22–32)
Calcium: 9.2 mg/dL (ref 8.9–10.3)
Creatinine, Ser: 0.74 mg/dL (ref 0.44–1.00)
GFR calc Af Amer: >60 mL/min (ref >60)
GFR calc non Af Amer: >60 mL/min (ref >60)
Glucose, Bld: 104 mg/dL — ABNORMAL HIGH (ref 65–99)
POTASSIUM: 3.2 mmol/L — AB (ref 3.5–5.1)
SODIUM: 142 mmol/L (ref 135–145)

## 2017-05-29 MED ORDER — PREDNISONE 20 MG PO TABS: 40.0000 mg | ORAL_TABLET | Freq: Every day | ORAL | 0 refills | Status: AC

## 2017-05-29 MED ORDER — IOPAMIDOL (ISOVUE-370) INJECTION 76%
INTRAVENOUS | Status: AC
  Administered 2017-05-29: 100 mL
  Filled 2017-05-29: qty 100

## 2017-05-29 MED ORDER — ALBUTEROL SULFATE HFA 108 (90 BASE) MCG/ACT IN AERS
1.0000 | INHALATION_SPRAY | Freq: Once | RESPIRATORY_TRACT | Status: AC
  Administered 2017-05-29: 2 via RESPIRATORY_TRACT
  Filled 2017-05-29: qty 6.7

## 2017-05-29 MED ORDER — ACETAMINOPHEN 500 MG PO TABS
500.0000 mg | ORAL_TABLET | Freq: Once | ORAL | Status: AC
  Administered 2017-05-29: 500 mg via ORAL
  Filled 2017-05-29: qty 1

## 2017-05-29 NOTE — ED Triage Notes (Signed)
Pt reports dull right side chest discomfort for over the past week. Reports pain is causing her to have sob and needing to use her inhaler more frequently. Pt thinks this is resp related but does have cardiac hx. ekg done at triage. Airway intact.

## 2017-05-29 NOTE — ED Provider Notes (Signed)
[facility] DEPT Provider Note   CSN: [id number] Arrival date & time: 05/29/17  6378   History   Chief Complaint Chief Complaint  Patient presents with  . Chest Pain  . Shortness of Breath    HPI [name] is a 66 y.o. female w PMHx PAF, COPD, HTN, presenting to ED with persistent intermittent SOB and right-sided sharp chest pain that began over 1 week ago. Pt was seen on 05/19/17 for similar complaint, with CXR showing emphysematous changes without consolidation. She was prescribed a steroid taper, and discharged with an albuterol inhaler and Tessalon. He states she finished the steroid as prescribed, and has been using the inhaler, however feels as though she is using it more frequently. She states the Tessalon helped with the cough and is coughing less frequently now. He states the chest pain is sharp, and sometimes worse with breathing, intermittent. She states the chest pain is not associated with nausea, diaphoresis, radiation of pain. Denies history of PE or DVT, exogenous estrogen use, recent trauma or surgery, recent travel, or leg swelling or pain. Patient is a former smoker.  The history is provided by the patient.    Past Medical History:  Diagnosis Date  . A-fib ([place])   . Allergy    seasonal  . Asthma   . Chronic anticoagulation   . GERD (gastroesophageal reflux disease)   . Hypertension   . PAF (paroxysmal atrial fibrillation) ([place]) 11/26/2015   April/May 2017: Multiple ED visits for A. Fib.  02/12/16: Improved on diltiazem IV. Cardiology consulted in the ED and discharge patient with diltiazem XR 120 mg once daily with 60 mg by mouth every 6 hours as needed for symptomatic palpitations, flecainide 50 mg twice daily. Referred to the atrial fibrillation clinic.  02/17/16: Seen in the atrial fibrillation clinic by NP Roderic Palau. Normal sin    Patient Active Problem List   Diagnosis Date Noted  . Allergic contact dermatitis 04/27/2017  . Osteoporosis without  current pathological fracture 04/15/2017  . Common cold 04/15/2017  . Constipation 10/19/2016  . Normocytic anemia 10/19/2016  . Rectal bleeding 10/19/2016  . Excessive cerumen in both ear canals 09/10/2016  . Hyperlipidemia 03/18/2016  . Special screening for malignant neoplasms, colon 03/17/2016  . Eczema 03/17/2016  . Cervical cancer screening 03/17/2016  . Assistance with transportation 03/17/2016  . Hypertension   . GERD (gastroesophageal reflux disease)   . PAF (paroxysmal atrial fibrillation) ([place]) 11/26/2015  . Chronic anticoagulation 11/26/2015  . Folliculitis 58/85/0277  . Marijuana abuse 06/11/2015  . Asthma, mild intermittent     Past Surgical History:  Procedure Laterality Date  . TONSILLECTOMY    . TUBAL LIGATION      OB History    No data available       Home Medications    Prior to Admission medications   Medication Sig Start Date End Date Taking? Authorizing Provider  albuterol (PROVENTIL HFA) 108 (90 Base) MCG/ACT inhaler Inhale 2 puffs into the lungs every 4 (four) hours as needed for wheezing or shortness of breath. 04/04/17  Yes [name], MD  benzonatate (TESSALON) 100 MG capsule Take 1 capsule (100 mg total) by mouth 3 (three) times daily as needed for cough. 05/19/17  Yes Ward, Delice Bison, DO  calamine lotion Apply 1 application topically as needed for itching. 05/02/17  Yes [name], MD  CARTIA XT 120 MG 24 hr capsule TAKE 1 CAPSULE(120 MG) BY MOUTH DAILY Patient taking differently: TAKE 1 CAPSULE(120 MG) BY  MOUTH evening 12/17/16  Yes Sherran Needs, NP  clobetasol cream (TEMOVATE) 4.26 % Apply 1 application topically 2 (two) times daily. 05/02/17  Yes [name], MD  diltiazem (CARDIZEM) 60 MG tablet Take 60 mg by mouth at bedtime.  04/17/17  Yes [provider]  diphenhydrAMINE (BENADRYL) 25 MG tablet Take 25 mg by mouth 2 (two) times daily as needed for allergies (scratchy throat).   Yes [provider]  fexofenadine  (ALLEGRA) 30 MG tablet Take 1 tablet (30 mg total) by mouth daily. 04/27/17  Yes Lacroce, Hulen Shouts, MD  flecainide (TAMBOCOR) 100 MG tablet Take 1 tablet (100 mg total) by mouth 2 (two) times daily. 04/27/17  Yes Lacroce, Hulen Shouts, MD  hydrOXYzine (ATARAX/VISTARIL) 25 MG tablet Take 1 tablet (25 mg total) by mouth every 6 (six) hours as needed for itching. 05/26/17  Yes [name], MD  losartan (COZAAR) 25 MG tablet Take 1 tablet (25 mg total) by mouth daily. 04/15/17  Yes [name], MD  potassium chloride SA (K-DUR,KLOR-CON) 10 MEQ tablet Take 2 tablets (20 mEq total) by mouth daily. Patient taking differently: Take 10 mEq by mouth 2 (two) times daily.  11/12/16  Yes Sherran Needs, NP  ranitidine (ZANTAC) 150 MG tablet TAKE 1 TABLET BY MOUTH TWICE DAILY Patient taking differently: TAKE 150 MG BY MOUTH TWICE DAILY AS NEEDED FOR ACID REFLUX 03/16/17  Yes Axel Filler, MD  rivaroxaban (XARELTO) 20 MG TABS tablet Take 1 tablet (20 mg total) by mouth daily with supper. 03/15/17  Yes Sherran Needs, NP  tiotropium (SPIRIVA HANDIHALER) 18 MCG inhalation capsule INHALE THE CONTENTS OF 1 CAPSULE(18 MCG) [place] DAY Patient taking differently: Place 18 mcg into inhaler and inhale daily.  02/16/17  Yes [name], MD  Cholecalciferol (VITAMIN D) 2000 units CAPS Take 1 capsule (2,000 Units total) by mouth daily. 04/18/17   [name], MD  fluticasone (FLONASE) 50 MCG/ACT nasal spray Place 1 spray into both nostrils daily. 04/15/17 04/15/18  [name], MD  hydrOXYzine (VISTARIL) 25 MG capsule Take 1-2 capsules (25-50 mg total) by mouth every 8 (eight) hours as needed. 05/19/17   Ward, Delice Bison, DO  polyethylene glycol powder (GLYCOLAX/MIRALAX) powder Mix 17g of Miralax in 8 ounces of water every other day 01/24/17   Armbruster, Carlota Raspberry, MD  predniSONE (DELTASONE) 20 MG tablet Take 2 tablets (40 mg total) by mouth daily. 05/29/17 06/03/17  Russo, [place] N, PA-C    senna-docusate (SENNA S) 8.6-50 MG tablet Take 1 tablet by mouth 2 (two) times daily. 02/16/17   [name], MD  triamcinolone (KENALOG) 0.025 % cream Apply 1 application topically 2 (two) times daily. For 2 weeks. 04/24/17   Gareth Morgan, MD    Family History Family History  Problem Relation Age of Onset  . Heart attack Mother        [name] age  . Breast cancer Sister        Late 80s; now s/p mastectomy   . Lung cancer Brother        x 2  . Cancer Sister   . Cancer Sister   . Cancer Sister   . Colon cancer Neg Hx   . Colon polyps Neg Hx   . Esophageal cancer Neg Hx   . Rectal cancer Neg Hx   . Stomach cancer Neg Hx     Social History Social History  Substance Use Topics  . Smoking status: Former Smoker  Types: Cigarettes    Quit date: 10/24/2015  . Smokeless tobacco: Never Used  . Alcohol use No     Allergies   Protonix [pantoprazole sodium] and Celecoxib   Review of Systems Review of Systems  Constitutional: Negative for chills, diaphoresis and fever.  HENT: Negative for congestion and sore throat.   Respiratory: Positive for cough, shortness of breath and wheezing.   Cardiovascular: Positive for chest pain.  Gastrointestinal: Negative for abdominal pain, nausea and vomiting.  Musculoskeletal: Negative for joint swelling.  All other systems reviewed and are negative.    Physical Exam Updated Vital Signs BP (!) 160/87   Pulse 61   Temp 98.6 F (37 C) (Oral)   Resp 19   SpO2 96%   Physical Exam  Constitutional: She appears well-developed and well-nourished. No distress.  HENT:  Head: Normocephalic and atraumatic.  Mouth/Throat: Oropharynx is clear and moist.  Eyes: Conjunctivae are normal.  Neck: Normal range of motion. Neck supple. No tracheal deviation present.  Cardiovascular: Normal rate, regular rhythm, normal heart sounds and intact distal pulses.  Exam reveals no friction rub.   No murmur heard. Pulmonary/Chest: Effort normal. No  stridor. No respiratory distress. She has wheezes (slight expiratory wheezes in lower lung fields.). She has no rales. She exhibits tenderness.  Increased work of breathing. Tolerating secretions. No stridor.  Abdominal: Soft. Bowel sounds are normal. She exhibits no distension. There is no tenderness.  Musculoskeletal: She exhibits no edema or tenderness.  No leg edema or tenderness.  Lymphadenopathy:    She has no cervical adenopathy.  Neurological: She is alert.  Skin: Skin is warm.  Psychiatric: She has a normal mood and affect. Her behavior is normal.  Nursing note and vitals reviewed.    ED Treatments / Results  Labs (all labs ordered are listed, but only abnormal results are displayed) Labs Reviewed  BASIC METABOLIC PANEL - Abnormal; Notable for the following:       Result Value   Potassium 3.2 (*)    Glucose, Bld 104 (*)    All other components within normal limits  CBC - Abnormal; Notable for the following:    RDW 15.7 (*)    All other components within normal limits  I-STAT TROPONIN, ED    EKG  EKG Interpretation  Date/Time:  Sunday May 29 2017 08:29:04 EDT Ventricular Rate:  79 PR Interval:  152 QRS Duration: 86 QT Interval:  400 QTC Calculation: 458 R Axis:   61 Text Interpretation:  Normal sinus rhythm Biatrial enlargement Abnormal ECG Confirmed by Fredia Sorrow [phone]) on 05/29/2017 9:53:10 AM       Radiology Dg Chest 2 View  Result Date: 05/29/2017 CLINICAL DATA:  Pt reports dull right side chest discomfort under breast for over the past week. Reports pain is causing her to have shortness of breath and needing to use her inhaler more frequently. History of asthma. Non-smoker EXAM: CHEST  2 VIEW COMPARISON:  05/19/2017 FINDINGS: The lungs are hyperinflated likely secondary to COPD. There is no focal parenchymal opacity. There is no pleural effusion or pneumothorax. The heart and mediastinal contours are unremarkable. The osseous structures are  unremarkable. IMPRESSION: No active cardiopulmonary disease. Electronically Signed   By: Kathreen Devoid   On: 05/29/2017 09:01   Ct Angio Chest Pe W/cm &/or Wo Cm  Result Date: 05/29/2017 CLINICAL DATA:  66 year old female with history of chest pain and shortness breath for 1 week. EXAM: CT ANGIOGRAPHY CHEST WITH CONTRAST TECHNIQUE: Multidetector CT imaging of  the chest was performed using the standard protocol during bolus administration of intravenous contrast. Multiplanar CT image reconstructions and MIPs were obtained to evaluate the vascular anatomy. CONTRAST:  100 mL of Isovue 370. COMPARISON:  No priors. FINDINGS: Cardiovascular: No filling defect within the pulmonary arterial tree to suggest underlying pulmonary embolism. Heart size is normal. There is no significant pericardial fluid, thickening or pericardial calcification. There is aortic atherosclerosis, as well as atherosclerosis of the great vessels of the mediastinum and the coronary arteries, including calcified atherosclerotic plaque in the left circumflex coronary artery. Aberrant right subclavian artery (normal anatomical variant) incidentally noted. Mediastinum/Nodes: No pathologically enlarged mediastinal or hilar lymph nodes. Esophagus is unremarkable in appearance. No axillary lymphadenopathy. Lungs/Pleura: No acute consolidative airspace disease. No pleural effusions. Mild scarring in the right middle lobe. 5 mm nodule in the right lower lobe (axial image 97 of series 7). No larger more suspicious appearing pulmonary nodules or masses are noted. Upper Abdomen: Low-attenuation lesions in the liver measuring up to 12 mm, compatible with simple cysts. Aortic atherosclerosis. Musculoskeletal: There are no aggressive appearing lytic or blastic lesions noted in the visualized portions of the skeleton. Review of the MIP images confirms the above findings. IMPRESSION: 1. No evidence pulmonary embolism. 2. No acute findings in the thorax to account  for the patient's symptoms. 3. 5 mm pulmonary nodule in the right lower lobe. No follow-up needed if patient is low-risk. Non-contrast chest CT can be considered in 12 months if patient is high-risk. This recommendation follows the consensus statement: Guidelines for Management of Incidental Pulmonary Nodules Detected on CT Images: From the Fleischner Society 2017; Radiology 2017; 284:228-243. 4. Aortic atherosclerosis, in addition to left circumflex coronary artery disease. Please note that although the presence of coronary artery calcium documents the presence of coronary artery disease, the severity of this disease and any potential stenosis cannot be assessed on this non-gated CT examination. Assessment for potential risk factor modification, dietary therapy or pharmacologic therapy may be warranted, if clinically indicated. Aortic Atherosclerosis (ICD10-I70.0). Electronically Signed   By: Vinnie Langton M.D.   On: 05/29/2017 11:50    Procedures Procedures (including critical care time)  Medications Ordered in ED Medications  acetaminophen (TYLENOL) tablet 500 mg (500 mg Oral Given 05/29/17 1035)  iopamidol (ISOVUE-370) 76 % injection (100 mLs  Contrast Given 05/29/17 1114)  albuterol (PROVENTIL HFA;VENTOLIN HFA) 108 (90 Base) MCG/ACT inhaler 1-2 puff (2 puffs Inhalation Given 05/29/17 1143)     Initial Impression / Assessment and Plan / ED Course  I have reviewed the triage vital signs and the nursing notes.  Pertinent labs & imaging results that were available during my care of the patient were reviewed by me and considered in my medical decision making (see chart for details).     Pt presenting with SOB and central sharp chest pain, likely 2/t COPD and musculoskeletal pain. Lungs with very mild wheezing, no increased work of breathing. O2 saturation above 95% throughout visit. No concern for cardiac etiology. CBC and CMP unremarkable. Trop neg. EKG without changes. CXR without  consolidation. CTA of chest negative for PE or other acute findings. Finding of pulmonary nodule which was discussed with patient and recommended to follow up with primary care for repeat imaging in the future. Discharge with prednisone burst. Conversation with patient about importance of following up with PCP about recent visits. She could benefit from pulmonary function tests and care plan for suspected diagnosis of COPD based on recent imaging. Nondistressed, safe for  discharge.  Patient discussed with and seen by Dr. Rogene Houston.  Discussed results, findings, treatment and follow up. Patient advised of return precautions. Patient verbalized understanding and agreed with plan.  Final Clinical Impressions(s) / ED Diagnoses   Final diagnoses:  Chest wall pain  Shortness of breath    New Prescriptions New Prescriptions   PREDNISONE (DELTASONE) 20 MG TABLET    Take 2 tablets (40 mg total) by mouth daily.     Russo, [place] N, PA-C 05/29/17 1212    Fredia Sorrow, MD 05/29/17 1311

## 2017-05-29 NOTE — ED Notes (Signed)
Pt returned from CT °

## 2017-05-29 NOTE — Discharge Instructions (Signed)
Please read instructions below. Begin taking the prednisone as prescribed until gone.  You can take Tylenol as needed for pain. Schedule an appointment with your primary care provider to discuss your recent visits and go over CT scan results. It is important to develop a care plan for your shortness of breath. Return to the ER for worsening shortness of breath, chest pain on exertion, fever, or new or worsening symptoms.

## 2017-05-29 NOTE — ED Notes (Signed)
Pt to CT

## 2017-06-10 ENCOUNTER — Other Ambulatory Visit (HOSPITAL_COMMUNITY): Payer: Self-pay | Admitting: *Deleted

## 2017-06-10 ENCOUNTER — Other Ambulatory Visit (HOSPITAL_COMMUNITY): Payer: Self-pay | Admitting: Nurse Practitioner

## 2017-06-10 MED ORDER — DILTIAZEM HCL 60 MG PO TABS: ORAL_TABLET | ORAL | 2 refills | Status: DC

## 2017-06-17 ENCOUNTER — Encounter: Payer: Self-pay | Admitting: Internal Medicine

## 2017-06-17 ENCOUNTER — Ambulatory Visit (INDEPENDENT_AMBULATORY_CARE_PROVIDER_SITE_OTHER): Payer: Medicare Other | Admitting: Internal Medicine

## 2017-06-17 ENCOUNTER — Encounter (INDEPENDENT_AMBULATORY_CARE_PROVIDER_SITE_OTHER): Payer: Self-pay

## 2017-06-17 VITALS — BP 161/80 | HR 56 | Temp 98.2°F | Ht 67.0 in | Wt 160.3 lb

## 2017-06-17 DIAGNOSIS — Z79899 Other long term (current) drug therapy: Secondary | ICD-10-CM | POA: Diagnosis not present

## 2017-06-17 DIAGNOSIS — I480 Paroxysmal atrial fibrillation: Secondary | ICD-10-CM

## 2017-06-17 DIAGNOSIS — L858 Other specified epidermal thickening: Secondary | ICD-10-CM | POA: Diagnosis not present

## 2017-06-17 DIAGNOSIS — L309 Dermatitis, unspecified: Secondary | ICD-10-CM

## 2017-06-17 DIAGNOSIS — Z23 Encounter for immunization: Secondary | ICD-10-CM | POA: Diagnosis not present

## 2017-06-17 DIAGNOSIS — Z87891 Personal history of nicotine dependence: Secondary | ICD-10-CM | POA: Diagnosis not present

## 2017-06-17 DIAGNOSIS — I10 Essential (primary) hypertension: Secondary | ICD-10-CM | POA: Diagnosis not present

## 2017-06-17 MED ORDER — CARBAMIDE PEROXIDE 6.5 % OT SOLN: 5.0000 [drp] | Freq: Two times a day (BID) | OTIC | 2 refills | Status: DC

## 2017-06-17 MED ORDER — LOSARTAN POTASSIUM 50 MG PO TABS: 50.0000 mg | ORAL_TABLET | Freq: Every day | ORAL | 5 refills | Status: DC

## 2017-06-17 MED ORDER — TRIAMCINOLONE ACETONIDE 0.025 % EX CREA: 1.0000 "application " | TOPICAL_CREAM | Freq: Two times a day (BID) | CUTANEOUS | 0 refills | Status: DC

## 2017-06-17 MED ORDER — HYDROXYZINE PAMOATE 25 MG PO CAPS: 25.0000 mg | ORAL_CAPSULE | Freq: Three times a day (TID) | ORAL | 1 refills | Status: DC | PRN

## 2017-06-17 MED ORDER — ALBUTEROL SULFATE HFA 108 (90 BASE) MCG/ACT IN AERS: 2.0000 | INHALATION_SPRAY | RESPIRATORY_TRACT | 1 refills | Status: DC | PRN

## 2017-06-17 NOTE — Patient Instructions (Signed)
Thank you for visiting clinic today. As we discussed I'm increasing the dose of losartan from 25 mg to 50 mg daily. Please take it as directed and follow up in 4 weeks for your blood pressure checkup. Please continue following your A. Fib clinic. I'm also giving you in ear drop,put 5 drops in each ear for 4-5 days.

## 2017-06-17 NOTE — Progress Notes (Signed)
   CC: For follow-up of her hypertension.  HPI:  [name] is a 66 y.o. with past medical history as listed below came to the clinic for follow-up of her hypertension.  Patient is feeling better denies any palpitations, or chest pain during current office visit, has frequent ED visits because of paroxysmal A. Fib. Follow-up with A. fib clinic.Marland Kitchen            She was complaining of itching in her existing right lower extremity hyperkeratotic rash. She also experienced intermittent generalized itching, uses hydroxyzine which helps, she was asking for a refill of hydroxyzine too.  Please see assessment and plan for her chronic conditions.  Past Medical History:  Diagnosis Date  . A-fib ([place])   . Allergy    seasonal  . Asthma   . Chronic anticoagulation   . GERD (gastroesophageal reflux disease)   . Hypertension   . PAF (paroxysmal atrial fibrillation) ([place]) 11/26/2015   April/May 2017: Multiple ED visits for A. Fib.  02/12/16: Improved on diltiazem IV. Cardiology consulted in the ED and discharge patient with diltiazem XR 120 mg once daily with 60 mg by mouth every 6 hours as needed for symptomatic palpitations, flecainide 50 mg twice daily. Referred to the atrial fibrillation clinic.  02/17/16: Seen in the atrial fibrillation clinic by NP Roderic Palau. Normal sin   Review of Systems:  As per HPI.  Physical Exam:  Vitals:   06/17/17 1518  BP: (!) 164/78  Pulse: 60  Temp: 98.2 F (36.8 C)  TempSrc: Oral  SpO2: 100%  Weight: 160 lb 4.8 oz (72.7 kg)  Height: 5\' 7"  (1.702 m)    General: Vital signs reviewed.  Patient is well-developed and well-nourished, in no acute distress and cooperative with exam.  Head: Normocephalic and atraumatic. Eyes: EOMI, conjunctivae normal, no scleral icterus.  Cardiovascular: RRR, S1 normal, S2 normal, no murmurs, gallops, or rubs. Pulmonary/Chest: Clear to auscultation bilaterally, no wheezes, rales, or rhonchi. Abdominal: Soft, non-tender,  non-distended, BS +, no masses, organomegaly, or guarding present.  Extremities: No lower extremity edema bilaterally,  pulses symmetric and intact bilaterally. No cyanosis or clubbing. Skin: Warm, dry and intact. Chronic hyperkeratotic stable rash on right lower leg. Psychiatric: Normal mood and affect. speech and behavior is normal. Cognition and memory are normal.  Assessment & Plan:   See Encounters Tab for problem based charting.  Patient discussed with Dr. Lynnae January

## 2017-06-18 LAB — HEPATITIS C ANTIBODY

## 2017-06-20 ENCOUNTER — Telehealth: Payer: Self-pay | Admitting: *Deleted

## 2017-06-20 ENCOUNTER — Telehealth: Payer: Self-pay | Admitting: Internal Medicine

## 2017-06-20 NOTE — Telephone Encounter (Signed)
Pt calls and states her ear drops are not at the pharm, triage called [facility] and insurance does not cover debrox, [facility] brand is 6.97, pt informed

## 2017-06-20 NOTE — Telephone Encounter (Signed)
Call from pt stating she was seen on 10/26 and MD was going to send in rx for "eardrops".  Rx for debrox was sent to pharmacy, but most insurance will not cover medications that are available otc.  Pt will contact pharmacy back and look into why she was not informed to get it over the counter when she was there picking up her other medications.  No further action needed, phone call complete.Despina Hidden Cassady10/29/20183:45 PM

## 2017-06-21 NOTE — Assessment & Plan Note (Signed)
Currently in sinus rhythm.   follow-up with A. fib clinic.  Continue current management.

## 2017-06-21 NOTE — Assessment & Plan Note (Signed)
She was complaining of generalized intermittent itching and asking for a hydroxyzine refill which was provided.  She continued to have itching in her chronic hyperkeratotic skin lesion on right lower leg, no sign of active infection. -Triamcinolone cream.

## 2017-06-21 NOTE — Assessment & Plan Note (Signed)
BP Readings from Last 3 Encounters:  06/17/17 (!) 161/80  05/29/17 (!) 160/87  05/19/17 (!) 160/82   Her blood pressure was elevated.  Increase her dose of losartan from 25 to 50 mg daily. Follow-up in 4 weeks for blood pressure check.

## 2017-06-22 NOTE — Progress Notes (Signed)
Internal Medicine Clinic Attending  Case discussed with Dr. Amin at the time of the visit.  We reviewed the resident's history and exam and pertinent patient test results.  I agree with the assessment, diagnosis, and plan of care documented in the resident's note.    

## 2017-06-27 ENCOUNTER — Telehealth: Payer: Self-pay

## 2017-06-27 NOTE — Telephone Encounter (Signed)
Called pt right back no answer, will try again

## 2017-06-27 NOTE — Telephone Encounter (Signed)
Requesting to speak with a nurse about meds. Please call back.  

## 2017-06-28 ENCOUNTER — Encounter (HOSPITAL_COMMUNITY): Payer: Self-pay | Admitting: *Deleted

## 2017-06-28 ENCOUNTER — Emergency Department (HOSPITAL_COMMUNITY)
Admission: EM | Admit: 2017-06-28 | Discharge: 2017-06-28 | Disposition: A | Discharge status: Home / Self Care | Payer: Medicare Other | Attending: Emergency Medicine | Admitting: Emergency Medicine

## 2017-06-28 ENCOUNTER — Emergency Department (HOSPITAL_COMMUNITY): Payer: Medicare Other

## 2017-06-28 DIAGNOSIS — J189 Pneumonia, unspecified organism: Secondary | ICD-10-CM

## 2017-06-28 DIAGNOSIS — J181 Lobar pneumonia, unspecified organism: Secondary | ICD-10-CM | POA: Diagnosis not present

## 2017-06-28 DIAGNOSIS — K5900 Constipation, unspecified: Secondary | ICD-10-CM

## 2017-06-28 DIAGNOSIS — R0602 Shortness of breath: Secondary | ICD-10-CM | POA: Diagnosis not present

## 2017-06-28 DIAGNOSIS — Z7901 Long term (current) use of anticoagulants: Secondary | ICD-10-CM | POA: Diagnosis not present

## 2017-06-28 DIAGNOSIS — J45909 Unspecified asthma, uncomplicated: Secondary | ICD-10-CM | POA: Diagnosis not present

## 2017-06-28 DIAGNOSIS — Z79899 Other long term (current) drug therapy: Secondary | ICD-10-CM | POA: Insufficient documentation

## 2017-06-28 DIAGNOSIS — Z748 Other problems related to care provider dependency: Secondary | ICD-10-CM

## 2017-06-28 DIAGNOSIS — I10 Essential (primary) hypertension: Secondary | ICD-10-CM | POA: Insufficient documentation

## 2017-06-28 DIAGNOSIS — Z87891 Personal history of nicotine dependence: Secondary | ICD-10-CM | POA: Diagnosis not present

## 2017-06-28 DIAGNOSIS — R05 Cough: Secondary | ICD-10-CM | POA: Diagnosis not present

## 2017-06-28 MED ORDER — POLYETHYLENE GLYCOL 3350 17 GM/SCOOP PO POWD: ORAL | 0 refills | Status: DC

## 2017-06-28 MED ORDER — TIOTROPIUM BROMIDE MONOHYDRATE 18 MCG IN CAPS: ORAL_CAPSULE | RESPIRATORY_TRACT | 0 refills | Status: DC

## 2017-06-28 MED ORDER — ALBUTEROL SULFATE (2.5 MG/3ML) 0.083% IN NEBU
5.0000 mg | INHALATION_SOLUTION | Freq: Once | RESPIRATORY_TRACT | Status: AC
  Administered 2017-06-28: 5 mg via RESPIRATORY_TRACT

## 2017-06-28 MED ORDER — IPRATROPIUM-ALBUTEROL 0.5-2.5 (3) MG/3ML IN SOLN
RESPIRATORY_TRACT | Status: AC
  Filled 2017-06-28: qty 3

## 2017-06-28 MED ORDER — DOXYCYCLINE HYCLATE 100 MG PO TABS
100.0000 mg | ORAL_TABLET | Freq: Once | ORAL | Status: AC
  Administered 2017-06-28: 100 mg via ORAL
  Filled 2017-06-28: qty 1

## 2017-06-28 MED ORDER — DEXAMETHASONE 4 MG PO TABS
10.0000 mg | ORAL_TABLET | Freq: Once | ORAL | Status: AC
  Administered 2017-06-28: 10 mg via ORAL
  Filled 2017-06-28: qty 3

## 2017-06-28 MED ORDER — BENZONATATE 100 MG PO CAPS: 100.0000 mg | ORAL_CAPSULE | Freq: Three times a day (TID) | ORAL | 0 refills | Status: DC

## 2017-06-28 MED ORDER — DOXYCYCLINE HYCLATE 100 MG PO CAPS: 100.0000 mg | ORAL_CAPSULE | Freq: Two times a day (BID) | ORAL | 0 refills | Status: DC

## 2017-06-28 MED ORDER — ALBUTEROL SULFATE (2.5 MG/3ML) 0.083% IN NEBU: 2.5000 mg | INHALATION_SOLUTION | Freq: Four times a day (QID) | RESPIRATORY_TRACT | 0 refills | Status: DC | PRN

## 2017-06-28 MED ORDER — ALBUTEROL SULFATE HFA 108 (90 BASE) MCG/ACT IN AERS: 2.0000 | INHALATION_SPRAY | RESPIRATORY_TRACT | 0 refills | Status: DC | PRN

## 2017-06-28 MED ORDER — FLUTICASONE PROPIONATE 50 MCG/ACT NA SUSP: 1.0000 | Freq: Every day | NASAL | 0 refills | Status: DC

## 2017-06-28 NOTE — ED Provider Notes (Signed)
[facility] EMERGENCY DEPARTMENT Provider Note   CSN: [id number] Arrival date & time: 06/28/17  8366     History   Chief Complaint Chief Complaint  Patient presents with  . Shortness of Breath    HPI [name] is a 66 y.o. female.  66 yo F with a chief complaint of cough congestion shortness of breath and chest pain on coughing for the past 3 or 4 days.  This happened after she got her flu vaccination.  She has been feeling a little bit run down as well.  Denies fevers.  She got a breathing treatment in triage and feels significantly better.  Has been doing her breathing treatments at home.  She also has been constipated.  No bowel movement in 2 days.  Takes MiraLAX chronically.  She wants something prescription strength to try and help her.  She denies sick contacts.  Describes her chest pain is sharp and stabbing when coughing.  Worse to the rib margins.   The history is provided by the patient.  Illness  This is a new problem. The current episode started more than 2 days ago. The problem occurs constantly. The problem has been gradually worsening. Associated symptoms include shortness of breath. Pertinent negatives include no chest pain and no headaches. Nothing aggravates the symptoms. Nothing relieves the symptoms. She has tried nothing for the symptoms. The treatment provided no relief.    Past Medical History:  Diagnosis Date  . A-fib ([place])   . Allergy    seasonal  . Asthma   . Chronic anticoagulation   . GERD (gastroesophageal reflux disease)   . Hypertension   . PAF (paroxysmal atrial fibrillation) ([place]) 11/26/2015   April/May 2017: Multiple ED visits for A. Fib.  02/12/16: Improved on diltiazem IV. Cardiology consulted in the ED and discharge patient with diltiazem XR 120 mg once daily with 60 mg by mouth every 6 hours as needed for symptomatic palpitations, flecainide 50 mg twice daily. Referred to the atrial fibrillation clinic.  02/17/16: Seen in  the atrial fibrillation clinic by NP Roderic Palau. Normal sin    Patient Active Problem List   Diagnosis Date Noted  . Allergic contact dermatitis 04/27/2017  . Osteoporosis without current pathological fracture 04/15/2017  . Common cold 04/15/2017  . Constipation 10/19/2016  . Normocytic anemia 10/19/2016  . Rectal bleeding 10/19/2016  . Excessive cerumen in both ear canals 09/10/2016  . Hyperlipidemia 03/18/2016  . Special screening for malignant neoplasms, colon 03/17/2016  . Eczema 03/17/2016  . Cervical cancer screening 03/17/2016  . Assistance with transportation 03/17/2016  . Hypertension   . GERD (gastroesophageal reflux disease)   . PAF (paroxysmal atrial fibrillation) ([place]) 11/26/2015  . Chronic anticoagulation 11/26/2015  . Folliculitis 29/47/6546  . Marijuana abuse 06/11/2015  . Asthma, mild intermittent     Past Surgical History:  Procedure Laterality Date  . TONSILLECTOMY    . TUBAL LIGATION      OB History    No data available       Home Medications    Prior to Admission medications   Medication Sig Start Date End Date Taking? Authorizing Provider  albuterol (PROVENTIL HFA) 108 (90 Base) MCG/ACT inhaler Inhale 2 puffs every 4 (four) hours as needed into the lungs for wheezing or shortness of breath. 06/28/17   [name], DO  benzonatate (TESSALON) 100 MG capsule Take 1 capsule (100 mg total) every 8 (eight) hours by mouth. 06/28/17   [name], DO  calamine lotion Apply 1 application topically as needed for itching. 05/02/17   [name], MD  carbamide peroxide (DEBROX) 6.5 % OTIC solution Place 5 drops into the right ear 2 (two) times daily. 06/17/17 06/17/18  [name], MD  CARTIA XT 120 MG 24 hr capsule TAKE 1 CAPSULE(120 MG) BY MOUTH DAILY Patient taking differently: TAKE 1 CAPSULE(120 MG) BY MOUTH evening 12/17/16   Sherran Needs, NP  Cholecalciferol (VITAMIN D) 2000 units CAPS Take 1 capsule (2,000 Units total) by mouth daily. 04/18/17    [name], MD  clobetasol cream (TEMOVATE) 5.62 % Apply 1 application topically 2 (two) times daily. 05/02/17   [name], MD  diltiazem (CARDIZEM) 60 MG tablet TAKE 1 TABLET BY MOUTH EVERY 6 HOURS AS NEEDED FOR RAPID AFIB HEART RATE OVER 100 06/10/17   Sherran Needs, NP  diphenhydrAMINE (BENADRYL) 25 MG tablet Take 25 mg by mouth 2 (two) times daily as needed for allergies (scratchy throat).    [provider]  doxycycline (VIBRAMYCIN) 100 MG capsule Take 1 capsule (100 mg total) 2 (two) times daily by mouth. 06/28/17   [name], DO  fexofenadine (ALLEGRA) 30 MG tablet Take 1 tablet (30 mg total) by mouth daily. 04/27/17   Lacroce, Hulen Shouts, MD  flecainide (TAMBOCOR) 100 MG tablet Take 1 tablet (100 mg total) by mouth 2 (two) times daily. 04/27/17   Lacroce, Hulen Shouts, MD  fluticasone (FLONASE) 50 MCG/ACT nasal spray Place 1 spray daily into both nostrils. 06/28/17 06/28/18  [name], DO  hydrOXYzine (ATARAX/VISTARIL) 25 MG tablet Take 1 tablet (25 mg total) by mouth every 6 (six) hours as needed for itching. 05/26/17   [name], MD  hydrOXYzine (VISTARIL) 25 MG capsule Take 1-2 capsules (25-50 mg total) by mouth every 8 (eight) hours as needed. 06/17/17   [name], MD  losartan (COZAAR) 50 MG tablet Take 1 tablet (50 mg total) by mouth daily. 06/17/17   [name], MD  polyethylene glycol powder (GLYCOLAX/MIRALAX) powder Mix 17g of Miralax in 8 ounces of water every other day 01/24/17   Armbruster, Carlota Raspberry, MD  potassium chloride SA (K-DUR,KLOR-CON) 10 MEQ tablet Take 2 tablets (20 mEq total) by mouth daily. Patient taking differently: Take 10 mEq by mouth 2 (two) times daily.  11/12/16   Sherran Needs, NP  ranitidine (ZANTAC) 150 MG tablet TAKE 1 TABLET BY MOUTH TWICE DAILY Patient taking differently: TAKE 150 MG BY MOUTH TWICE DAILY AS NEEDED FOR ACID REFLUX 03/16/17   Axel Filler, MD  rivaroxaban (XARELTO) 20 MG TABS tablet Take 1 tablet (20 mg total) by  mouth daily with supper. 03/15/17   Sherran Needs, NP  senna-docusate (SENNA S) 8.6-50 MG tablet Take 1 tablet by mouth 2 (two) times daily. 02/16/17   [name], MD  tiotropium (SPIRIVA HANDIHALER) 18 MCG inhalation capsule INHALE THE CONTENTS OF 1 CAPSULE(18 MCG) VIA INHALATION DEVICE EVERY DAY 06/28/17   [name], DO  triamcinolone (KENALOG) 0.025 % cream Apply 1 application topically 2 (two) times daily. For 2 weeks. 06/17/17   [name], MD    Family History Family History  Problem Relation Age of Onset  . Heart attack Mother        [name] age  . Breast cancer Sister        Late 52s; now s/p mastectomy   . Lung cancer Brother        x 2  . Cancer Sister   . Cancer  Sister   . Cancer Sister   . Colon cancer Neg Hx   . Colon polyps Neg Hx   . Esophageal cancer Neg Hx   . Rectal cancer Neg Hx   . Stomach cancer Neg Hx     Social History Social History   Tobacco Use  . Smoking status: Former Smoker    Types: Cigarettes    Last attempt to quit: 10/24/2015    Years since quitting: 1.6  . Smokeless tobacco: Never Used  Substance Use Topics  . Alcohol use: No  . Drug use: No    Comment: Smoked marijuana in the past.     Allergies   Protonix [pantoprazole sodium] and Celecoxib   Review of Systems Review of Systems  Constitutional: Positive for fatigue. Negative for chills and fever.  HENT: Negative for congestion and rhinorrhea.   Eyes: Negative for redness and visual disturbance.  Respiratory: Positive for cough, shortness of breath and wheezing.   Cardiovascular: Negative for chest pain and palpitations.  Gastrointestinal: Negative for nausea and vomiting.  Genitourinary: Negative for dysuria and urgency.  Musculoskeletal: Negative for arthralgias and myalgias.  Skin: Negative for pallor and wound.  Neurological: Negative for dizziness and headaches.     Physical Exam Updated Vital Signs BP (!) 169/87 (BP Location: Right Arm)   Pulse 70   Temp 97.8  F (36.6 C) (Oral)   Resp 20   Ht 5\' 7"  (1.702 m)   Wt 72.6 kg (160 lb)   SpO2 97%   BMI 25.06 kg/m   Physical Exam  Constitutional: She is oriented to person, place, and time. She appears well-developed and well-nourished. No distress.  HENT:  Head: Normocephalic and atraumatic.  Eyes: EOM are normal. Pupils are equal, round, and reactive to light.  Neck: Normal range of motion. Neck supple.  Cardiovascular: Normal rate and regular rhythm. Exam reveals no gallop and no friction rub.  No murmur heard. Pulmonary/Chest: Effort normal and breath sounds normal. She has no wheezes. She has no rales.  Abdominal: Soft. She exhibits no distension. There is no tenderness.  Musculoskeletal: She exhibits no edema or tenderness.  Neurological: She is alert and oriented to person, place, and time.  Skin: Skin is warm and dry. She is not diaphoretic.  Psychiatric: She has a normal mood and affect. Her behavior is normal.  Nursing note and vitals reviewed.    ED Treatments / Results  Labs (all labs ordered are listed, but only abnormal results are displayed) Labs Reviewed - No data to display  EKG  EKG Interpretation None       Radiology Dg Chest 2 View  Result Date: 06/28/2017 CLINICAL DATA:  Shortness of breath and cough since Friday after the flu shot. History of asthma. EXAM: CHEST  2 VIEW COMPARISON:  05/29/2017 FINDINGS: Hyperinflation. Mild focal infiltration in the right middle lung may represent focal pneumonia. Peribronchial thickening suggesting bronchitis. Left lung is clear. No blunting of costophrenic angles. No pneumothorax. Calcification of the aorta. IMPRESSION: Focal infiltration in the right mid lung may represent focal pneumonia. Peribronchial thickening suggesting bronchitis. Electronically Signed   By: Lucienne Capers M.D.   On: 06/28/2017 06:34    Procedures Procedures (including critical care time)  Medications Ordered in ED Medications    ipratropium-albuterol (DUONEB) 0.5-2.5 (3) MG/3ML nebulizer solution (not administered)  dexamethasone (DECADRON) tablet 10 mg (not administered)  doxycycline (VIBRA-TABS) tablet 100 mg (not administered)  albuterol (PROVENTIL) (2.5 MG/3ML) 0.083% nebulizer solution 5 mg (5 mg Nebulization  Given 06/28/17 0552)     Initial Impression / Assessment and Plan / ED Course  I have reviewed the triage vital signs and the nursing notes.  Pertinent labs & imaging results that were available during my care of the patient were reviewed by me and considered in my medical decision making (see chart for details).     66 yo F with a chief complaint of cough and shortness of breath after getting her flu vaccination.  Chest x-ray is concerning for pneumonia.  Will start on antibiotics.  Patient is requesting multiple refills.  Suggest that she follow-up with her family physician.  9:53 AM:  I have discussed the diagnosis/risks/treatment options with the patient and believe the pt to be eligible for discharge home to follow-up with PCP. We also discussed returning to the ED immediately if new or worsening sx occur. We discussed the sx which are most concerning (e.g., sudden worsening pain, fever, inability to tolerate by mouth) that necessitate immediate return. Medications administered to the patient during their visit and any new prescriptions provided to the patient are listed below.  Medications given during this visit Medications  ipratropium-albuterol (DUONEB) 0.5-2.5 (3) MG/3ML nebulizer solution (not administered)  dexamethasone (DECADRON) tablet 10 mg (not administered)  doxycycline (VIBRA-TABS) tablet 100 mg (not administered)  albuterol (PROVENTIL) (2.5 MG/3ML) 0.083% nebulizer solution 5 mg (5 mg Nebulization Given 06/28/17 0552)     The patient appears reasonably screen and/or stabilized for discharge and I doubt any other medical condition or other [facility] requiring further screening, evaluation,  or treatment in the ED at this time prior to discharge.    Final Clinical Impressions(s) / ED Diagnoses   Final diagnoses:  Constipation, unspecified constipation type  Community acquired pneumonia of right middle lobe of lung [facility])    ED Discharge Orders        Ordered    doxycycline (VIBRAMYCIN) 100 MG capsule  2 times daily     06/28/17 0950    tiotropium (SPIRIVA HANDIHALER) 18 MCG inhalation capsule     06/28/17 0950    fluticasone (FLONASE) 50 MCG/ACT nasal spray  Daily     06/28/17 0950    albuterol (PROVENTIL HFA) 108 (90 Base) MCG/ACT inhaler  Every 4 hours PRN     06/28/17 0950    benzonatate (TESSALON) 100 MG capsule  Every 8 hours     06/28/17 [place], [place], DO 06/28/17 [phone]

## 2017-06-28 NOTE — ED Triage Notes (Signed)
C/o sob onset several days , states she has been coughing unable to get any thing up. States she has been using her inhaler at home and its not working.

## 2017-06-28 NOTE — ED Notes (Signed)
Pt state she undersrtrnds instructions. Home stable with steady gait.

## 2017-06-28 NOTE — Discharge Instructions (Signed)
Use your inhaler every 4 hours(6 puffs) while awake, return for sudden worsening shortness of breath, or if you need to use your inhaler more often.   TAKE 8 CAPFULS OF MIRALAX IN A 32 OUNCE GATORADE AND DRINK THE WHOLE BEVERAGE

## 2017-06-30 ENCOUNTER — Emergency Department (HOSPITAL_COMMUNITY): Payer: Medicare Other

## 2017-06-30 ENCOUNTER — Encounter (HOSPITAL_COMMUNITY): Payer: Self-pay | Admitting: *Deleted

## 2017-06-30 ENCOUNTER — Other Ambulatory Visit: Payer: Self-pay

## 2017-06-30 ENCOUNTER — Emergency Department (HOSPITAL_COMMUNITY)
Admission: EM | Admit: 2017-06-30 | Discharge: 2017-06-30 | Disposition: A | Discharge status: Home / Self Care | Payer: Medicare Other | Attending: Emergency Medicine | Admitting: Emergency Medicine

## 2017-06-30 DIAGNOSIS — I10 Essential (primary) hypertension: Secondary | ICD-10-CM | POA: Insufficient documentation

## 2017-06-30 DIAGNOSIS — Z7901 Long term (current) use of anticoagulants: Secondary | ICD-10-CM | POA: Insufficient documentation

## 2017-06-30 DIAGNOSIS — J45901 Unspecified asthma with (acute) exacerbation: Secondary | ICD-10-CM | POA: Diagnosis not present

## 2017-06-30 DIAGNOSIS — R0602 Shortness of breath: Secondary | ICD-10-CM | POA: Diagnosis not present

## 2017-06-30 DIAGNOSIS — Z79899 Other long term (current) drug therapy: Secondary | ICD-10-CM | POA: Diagnosis not present

## 2017-06-30 DIAGNOSIS — J4521 Mild intermittent asthma with (acute) exacerbation: Secondary | ICD-10-CM | POA: Diagnosis not present

## 2017-06-30 DIAGNOSIS — Z87891 Personal history of nicotine dependence: Secondary | ICD-10-CM | POA: Insufficient documentation

## 2017-06-30 DIAGNOSIS — J45909 Unspecified asthma, uncomplicated: Secondary | ICD-10-CM | POA: Insufficient documentation

## 2017-06-30 DIAGNOSIS — R0789 Other chest pain: Secondary | ICD-10-CM

## 2017-06-30 DIAGNOSIS — R079 Chest pain, unspecified: Secondary | ICD-10-CM | POA: Diagnosis not present

## 2017-06-30 MED ORDER — PREDNISONE 20 MG PO TABS: 40.0000 mg | ORAL_TABLET | Freq: Every day | ORAL | 0 refills | Status: AC

## 2017-06-30 MED ORDER — ALBUTEROL SULFATE HFA 108 (90 BASE) MCG/ACT IN AERS
2.0000 | INHALATION_SPRAY | Freq: Once | RESPIRATORY_TRACT | Status: AC
  Administered 2017-06-30: 2 via RESPIRATORY_TRACT
  Filled 2017-06-30: qty 6.7

## 2017-06-30 MED ORDER — ALBUTEROL SULFATE (2.5 MG/3ML) 0.083% IN NEBU
INHALATION_SOLUTION | RESPIRATORY_TRACT | Status: AC
  Filled 2017-06-30: qty 3

## 2017-06-30 MED ORDER — ALBUTEROL SULFATE (2.5 MG/3ML) 0.083% IN NEBU
5.0000 mg | INHALATION_SOLUTION | Freq: Once | RESPIRATORY_TRACT | Status: AC
  Administered 2017-06-30: 5 mg via RESPIRATORY_TRACT

## 2017-06-30 MED ORDER — AEROCHAMBER PLUS FLO-VU LARGE MISC: 1.0000 | Freq: Once | Status: DC

## 2017-06-30 NOTE — ED Provider Notes (Signed)
[facility] EMERGENCY DEPARTMENT Provider Note   CSN: [id number] Arrival date & time: 06/30/17  1052     History   Chief Complaint Chief Complaint  Patient presents with  . Shortness of Breath  . Chest Pain    HPI [name] is a 66 y.o. female with a history of asthma who presents to the Emergency Department with a chief complaint of non-radiating, constant chest tightness. She reports that her symptoms began around 8 AM this morning she took an albuterol nebulizer treatment at home with minimal improvement.  She reports that she was evaluated in the emergency department 2 days ago and diagnosed with community-acquired pneumonia and discharged with [facility] and doxycycline, which she has been compliant with.  She reports that she was given a prescription for an albuterol inhaler, but the pharmacy would not fill it because she had a previous inhaler filled that she can no longer find at home.   She reports that she has an nebulizer machine and took an albuterol treatment around 8 AM with mild improvement of her symptoms, but it did not fully resolve her chest tightness so she came to the Emergency Department for evaluation. She states "they gave me a steroid when I was here during my last visit and said it should last for 4 days, but my chest felt tight today.   She denies fever, chills, palpitations, leg swelling, lightheadedness, dizziness, weakness, numbness, or shortness of breath. She reports her cough has improved with Tessalon Perles.   The history is provided by the patient. No language interpreter was used.  Chest Pain   This is a recurrent problem. The current episode started 6 to 12 hours ago. The problem occurs constantly. The problem has been resolved. Quality: tightness. The pain does not radiate. Associated symptoms include cough. Pertinent negatives include no abdominal pain, no back pain, no diaphoresis, no dizziness, no exertional chest  pressure, no fever, no headaches, no nausea, no near-syncope, no palpitations, no shortness of breath, no syncope, no vomiting and no weakness. Treatments tried: albuterol nebulizer treatment. The treatment provided mild relief.    Past Medical History:  Diagnosis Date  . A-fib ([place])   . Allergy    seasonal  . Asthma   . Chronic anticoagulation   . GERD (gastroesophageal reflux disease)   . Hypertension   . PAF (paroxysmal atrial fibrillation) ([place]) 11/26/2015   April/May 2017: Multiple ED visits for A. Fib.  02/12/16: Improved on diltiazem IV. Cardiology consulted in the ED and discharge patient with diltiazem XR 120 mg once daily with 60 mg by mouth every 6 hours as needed for symptomatic palpitations, flecainide 50 mg twice daily. Referred to the atrial fibrillation clinic.  02/17/16: Seen in the atrial fibrillation clinic by NP Roderic Palau. Normal sin    Patient Active Problem List   Diagnosis Date Noted  . Allergic contact dermatitis 04/27/2017  . Osteoporosis without current pathological fracture 04/15/2017  . Common cold 04/15/2017  . Constipation 10/19/2016  . Normocytic anemia 10/19/2016  . Rectal bleeding 10/19/2016  . Excessive cerumen in both ear canals 09/10/2016  . Hyperlipidemia 03/18/2016  . Special screening for malignant neoplasms, colon 03/17/2016  . Eczema 03/17/2016  . Cervical cancer screening 03/17/2016  . Assistance with transportation 03/17/2016  . Hypertension   . GERD (gastroesophageal reflux disease)   . PAF (paroxysmal atrial fibrillation) ([place]) 11/26/2015  . Chronic anticoagulation 11/26/2015  . Folliculitis 34/74/2595  . Marijuana abuse 06/11/2015  . Asthma,  mild intermittent     Past Surgical History:  Procedure Laterality Date  . TONSILLECTOMY    . TUBAL LIGATION      OB History    No data available       Home Medications    Prior to Admission medications   Medication Sig Start Date End Date Taking? Authorizing Provider  albuterol  (PROVENTIL HFA) 108 (90 Base) MCG/ACT inhaler Inhale 2 puffs every 4 (four) hours as needed into the lungs for wheezing or shortness of breath. 06/28/17  Yes [name], DO  albuterol (PROVENTIL) (2.5 MG/3ML) 0.083% nebulizer solution Take 3 mLs (2.5 mg total) every 6 (six) hours as needed by nebulization for wheezing or shortness of breath. 06/28/17  Yes [name], DO  benzonatate (TESSALON) 100 MG capsule Take 1 capsule (100 mg total) every 8 (eight) hours by mouth. Patient taking differently: Take 100 mg 3 (three) times daily as needed by mouth for cough.  06/28/17  Yes Floyd, Dan, DO  CARTIA XT 120 MG 24 hr capsule TAKE 1 CAPSULE(120 MG) BY MOUTH DAILY Patient taking differently: TAKE 1 CAPSULE(120 MG) BY MOUTH evening 12/17/16  Yes Sherran Needs, NP  diltiazem (CARDIZEM) 60 MG tablet TAKE 1 TABLET BY MOUTH EVERY 6 HOURS AS NEEDED FOR RAPID AFIB HEART RATE OVER 100 06/10/17  Yes Sherran Needs, NP  diphenhydrAMINE (BENADRYL) 25 MG tablet Take 25 mg by mouth 2 (two) times daily as needed for allergies (scratchy throat).   Yes [provider]  doxycycline (VIBRAMYCIN) 100 MG capsule Take 1 capsule (100 mg total) 2 (two) times daily by mouth. 06/28/17  Yes [name], DO  flecainide (TAMBOCOR) 100 MG tablet Take 1 tablet (100 mg total) by mouth 2 (two) times daily. 04/27/17  Yes Lacroce, Hulen Shouts, MD  fluticasone (FLONASE) 50 MCG/ACT nasal spray Place 1 spray daily into both nostrils. 06/28/17 06/28/18 Yes [name], DO  hydrOXYzine (ATARAX/VISTARIL) 25 MG tablet Take 1 tablet (25 mg total) by mouth every 6 (six) hours as needed for itching. 05/26/17  Yes [name], MD  losartan (COZAAR) 50 MG tablet Take 1 tablet (50 mg total) by mouth daily. 06/17/17  Yes [name], MD  polyethylene glycol powder (GLYCOLAX/MIRALAX) powder Mix 17g of Miralax in 8 ounces of water every other day 06/28/17  Yes [name], DO  potassium chloride SA (K-DUR,KLOR-CON) 10 MEQ tablet Take 2 tablets (20 mEq  total) by mouth daily. Patient taking differently: Take 10 mEq by mouth 2 (two) times daily.  11/12/16  Yes Sherran Needs, NP  ranitidine (ZANTAC) 150 MG tablet TAKE 1 TABLET BY MOUTH TWICE DAILY Patient taking differently: TAKE 150 MG BY MOUTH TWICE DAILY AS NEEDED FOR ACID REFLUX 03/16/17  Yes Axel Filler, MD  rivaroxaban (XARELTO) 20 MG TABS tablet Take 1 tablet (20 mg total) by mouth daily with supper. 03/15/17  Yes Sherran Needs, NP  senna-docusate (SENNA S) 8.6-50 MG tablet Take 1 tablet by mouth 2 (two) times daily. 02/16/17  Yes [name], MD  tiotropium (SPIRIVA HANDIHALER) 18 MCG inhalation capsule INHALE THE CONTENTS OF 1 CAPSULE(18 MCG) VIA INHALATION DEVICE EVERY DAY 06/28/17  Yes [name], DO  hydrOXYzine (VISTARIL) 25 MG capsule Take 1-2 capsules (25-50 mg total) by mouth every 8 (eight) hours as needed. Patient not taking: Reported on 06/30/2017 06/17/17   [name], MD  predniSONE (DELTASONE) 20 MG tablet Take 2 tablets (40 mg total) daily for 5 days by mouth. 06/30/17 07/05/17  Jasyah Theurer A, PA-C  triamcinolone (KENALOG) 0.025 % cream Apply 1 application topically 2 (two) times daily. For 2 weeks. Patient not taking: Reported on 06/30/2017 06/17/17   [name], MD    Family History Family History  Problem Relation Age of Onset  . Heart attack Mother        [name] age  . Breast cancer Sister        Late 16s; now s/p mastectomy   . Lung cancer Brother        x 2  . Cancer Sister   . Cancer Sister   . Cancer Sister   . Colon cancer Neg Hx   . Colon polyps Neg Hx   . Esophageal cancer Neg Hx   . Rectal cancer Neg Hx   . Stomach cancer Neg Hx     Social History Social History   Tobacco Use  . Smoking status: Former Smoker    Types: Cigarettes    Last attempt to quit: 10/24/2015    Years since quitting: 1.6  . Smokeless tobacco: Never Used  Substance Use Topics  . Alcohol use: No  . Drug use: No    Comment: Smoked marijuana in the past.       Allergies   Protonix [pantoprazole sodium] and Celecoxib   Review of Systems Review of Systems  Constitutional: Negative for diaphoresis and fever.  Respiratory: Positive for cough and wheezing. Negative for shortness of breath.   Cardiovascular: Positive for chest pain (tightness). Negative for palpitations, leg swelling, syncope and near-syncope.  Gastrointestinal: Negative for abdominal pain, nausea and vomiting.  Musculoskeletal: Negative for back pain, neck pain and neck stiffness.  Neurological: Negative for dizziness, weakness and headaches.     Physical Exam Updated Vital Signs BP (!) 172/87 (BP Location: Right Arm)   Pulse 64   Temp 98.4 F (36.9 C) (Oral)   Resp (!) 22   SpO2 99%   Physical Exam  Constitutional: No distress. She is not intubated.  HENT:  Head: Normocephalic.  Eyes: Conjunctivae are normal.  Neck: Neck supple.  Cardiovascular: Normal rate, regular rhythm and normal heart sounds. Exam reveals no gallop and no friction rub.  No murmur heard. Pulmonary/Chest: Effort normal and breath sounds normal. No accessory muscle usage or stridor. No apnea, no tachypnea and no bradypnea. She is not intubated. No respiratory distress. She has no decreased breath sounds. She has no wheezes. She has no rhonchi. She has no rales.  Abdominal: Soft. She exhibits no distension.  Neurological: She is alert.  Skin: Skin is warm. No rash noted.  Psychiatric: Her behavior is normal.  Nursing note and vitals reviewed.  ED Treatments / Results  Labs (all labs ordered are listed, but only abnormal results are displayed) Labs Reviewed - No data to display  EKG  EKG Interpretation  Date/Time:  Thursday June 30 2017 10:58:28 EST Ventricular Rate:  78 PR Interval:  164 QRS Duration: 92 QT Interval:  410 QTC Calculation: 467 R Axis:   69 Text Interpretation:  Sinus rhythm with occasional Premature ventricular complexes Biatrial enlargement Incomplete right  bundle branch block Abnormal ECG no significant change since May 29 2017 Confirmed by Sherwood Gambler [phone]) on 06/30/2017 2:48:45 PM       Radiology Dg Chest 2 View  Result Date: 06/30/2017 CLINICAL DATA:  Shortness of breath, chest pain and tightness today EXAM: CHEST  2 VIEW COMPARISON:  Chest x-ray of 06/28/2017 FINDINGS: Linear scarring or atelectasis is noted in the right  middle lobe and right lung base. No pneumonia or effusion is seen. The lungs are slightly hyperaerated. Mediastinal and hilar contours are unremarkable and mild cardiomegaly is stable. No acute bony abnormality is seen. IMPRESSION: Linear scarring or atelectasis in the right middle lobe and right lung base. No definite active process. Electronically Signed   By: Ivar Drape M.D.   On: 06/30/2017 11:58    Procedures Procedures (including critical care time)  Medications Ordered in ED Medications  albuterol (PROVENTIL) (2.5 MG/3ML) 0.083% nebulizer solution 5 mg (5 mg Nebulization Given 06/30/17 1120)  albuterol (PROVENTIL HFA;VENTOLIN HFA) 108 (90 Base) MCG/ACT inhaler 2 puff (2 puffs Inhalation Given 06/30/17 1632)     Initial Impression / Assessment and Plan / ED Course  I have reviewed the triage vital signs and the nursing notes.  Pertinent labs & imaging results that were available during my care of the patient were reviewed by me and considered in my medical decision making (see chart for details).     66 year old female with a h/o of asthma presenting with dyspnea and chest tightness since this morning. The patient was seen and discussed with Dr. Regenia Skeeter, attending physician. She took one albuterol treatment with mild improvement of her symptoms. She was seen earlier this week and dxed with CAP and started on doxycycline. Repeat CXR is improved. Duo neb given in the ED with resolution of her dyspnea and chest tightness. She was d/ced during her last visit with a prescription for an albuterol inhaler, which was  not covered by her insurance because she'd had a prescription filled in the last 30 days, but she has since lost the inhaler. Will replace her inhaler and spacer in the ED today. Will sent home with a burst of prednisone. VSS. NAD. All questions answered. The patient is safe for d/c at this time.   Final Clinical Impressions(s) / ED Diagnoses   Final diagnoses:  Mild intermittent asthma with exacerbation  Sensation of chest tightness    ED Discharge Orders        Ordered    predniSONE (DELTASONE) 20 MG tablet  Daily     06/30/17 1642       Devany Aja A, PA-C 07/02/17 0011    Sherwood Gambler, MD 07/02/17 2004

## 2017-06-30 NOTE — ED Notes (Signed)
Pt stable, ambulatory, states understanding of discharge instructions 

## 2017-06-30 NOTE — ED Notes (Signed)
Pt ambulated in hallway independently. Stable gait noted. Pt stated she "felt normal". O2 sat at 98%

## 2017-06-30 NOTE — Discharge Instructions (Signed)
Take 40 mg of prednisone once daily with breakfast for the next 5 days.   Please make sure to take all of the doxycycline, the antibiotic, you were prescribed during your last visit, even if you start to feel better.  You can use 2 puffs of your albuterol inhaler or 1 nebulizer treatment once every 4 hours as needed for chest tightness or shortness of breath.  Please use 1 of these medications, but not both.  Please now 1 of the side effects of using albuterol every 4 hours is increased heart rate.  If your chest tightness persists despite using both of these treatments, please follow-up with Dr. Reesa Chew, your primary care provider.  If you develop worsening symptoms, including chest pain, dizziness, lightheadedness, shortness of breath that does not improve with your albuterol, or other concerning symptoms, please return to the emergency department for reevaluation.

## 2017-06-30 NOTE — ED Notes (Signed)
States dx with pneumonia  On Monday and today she woke up and her chest hurt  Got tight

## 2017-06-30 NOTE — ED Triage Notes (Addendum)
To ED for eval of sob and cp since this am. States she was dx with pneumonia a few days ago. Was given cough meds, nasal spray, abx, and inhaler script. Pt states her insurance wouldn't pay for the inhaler and to she thinks she need a treatment. resp e/u. Denies coughing but states her chest is tight and aches.

## 2017-07-13 ENCOUNTER — Other Ambulatory Visit: Payer: Self-pay

## 2017-07-13 ENCOUNTER — Emergency Department (HOSPITAL_COMMUNITY): Payer: Medicare Other

## 2017-07-13 ENCOUNTER — Emergency Department (HOSPITAL_COMMUNITY)
Admission: EM | Admit: 2017-07-13 | Discharge: 2017-07-13 | Disposition: A | Discharge status: Home / Self Care | Payer: Medicare Other | Attending: Physician Assistant | Admitting: Physician Assistant

## 2017-07-13 ENCOUNTER — Encounter (HOSPITAL_COMMUNITY): Payer: Self-pay

## 2017-07-13 DIAGNOSIS — I10 Essential (primary) hypertension: Secondary | ICD-10-CM | POA: Insufficient documentation

## 2017-07-13 DIAGNOSIS — I4891 Unspecified atrial fibrillation: Secondary | ICD-10-CM

## 2017-07-13 DIAGNOSIS — R05 Cough: Secondary | ICD-10-CM | POA: Diagnosis not present

## 2017-07-13 DIAGNOSIS — F1210 Cannabis abuse, uncomplicated: Secondary | ICD-10-CM | POA: Insufficient documentation

## 2017-07-13 DIAGNOSIS — J45901 Unspecified asthma with (acute) exacerbation: Secondary | ICD-10-CM | POA: Diagnosis not present

## 2017-07-13 DIAGNOSIS — Z87891 Personal history of nicotine dependence: Secondary | ICD-10-CM | POA: Diagnosis not present

## 2017-07-13 DIAGNOSIS — I480 Paroxysmal atrial fibrillation: Secondary | ICD-10-CM | POA: Diagnosis not present

## 2017-07-13 DIAGNOSIS — R0602 Shortness of breath: Secondary | ICD-10-CM | POA: Diagnosis present

## 2017-07-13 DIAGNOSIS — Z79899 Other long term (current) drug therapy: Secondary | ICD-10-CM | POA: Insufficient documentation

## 2017-07-13 DIAGNOSIS — Z7901 Long term (current) use of anticoagulants: Secondary | ICD-10-CM | POA: Diagnosis not present

## 2017-07-13 LAB — CBC WITH DIFFERENTIAL/PLATELET
BASOS ABS: 0 10*3/uL (ref 0.0–0.1)
BASOS PCT: 0 %
EOS PCT: 8 %
Eosinophils Absolute: 0.5 10*3/uL (ref 0.0–0.7)
HEMATOCRIT: 38.1 % (ref 36.0–46.0)
Hemoglobin: 12.3 g/dL (ref 12.0–15.0)
LYMPHS PCT: 24 %
Lymphs Abs: 1.6 10*3/uL (ref 0.7–4.0)
MCH: 26.6 pg (ref 26.0–34.0)
MCHC: 32.3 g/dL (ref 30.0–36.0)
MCV: 82.5 fL (ref 78.0–100.0)
Monocytes Absolute: 0.7 10*3/uL (ref 0.1–1.0)
Monocytes Relative: 11 %
NEUTROS ABS: 3.8 10*3/uL (ref 1.7–7.7)
Neutrophils Relative %: 57 %
PLATELETS: 212 10*3/uL (ref 150–400)
RBC: 4.62 MIL/uL (ref 3.87–5.11)
RDW: 15.8 % — ABNORMAL HIGH (ref 11.5–15.5)
WBC: 6.7 10*3/uL (ref 4.0–10.5)

## 2017-07-13 LAB — BASIC METABOLIC PANEL
ANION GAP: 7 (ref 5–15)
BUN: 10 mg/dL (ref 6–20)
CO2: 26 mmol/L (ref 22–32)
Calcium: 9.4 mg/dL (ref 8.9–10.3)
Chloride: 107 mmol/L (ref 101–111)
Creatinine, Ser: 0.82 mg/dL (ref 0.44–1.00)
GLUCOSE: 91 mg/dL (ref 65–99)
POTASSIUM: 3.6 mmol/L (ref 3.5–5.1)
Sodium: 140 mmol/L (ref 135–145)

## 2017-07-13 MED ORDER — IPRATROPIUM BROMIDE 0.02 % IN SOLN
0.5000 mg | Freq: Once | RESPIRATORY_TRACT | Status: AC
  Administered 2017-07-13: 0.5 mg via RESPIRATORY_TRACT
  Filled 2017-07-13: qty 2.5

## 2017-07-13 MED ORDER — ACETAMINOPHEN 325 MG PO TABS
650.0000 mg | ORAL_TABLET | Freq: Once | ORAL | Status: AC
  Administered 2017-07-13: 650 mg via ORAL
  Filled 2017-07-13: qty 2

## 2017-07-13 MED ORDER — ALBUTEROL SULFATE (2.5 MG/3ML) 0.083% IN NEBU
5.0000 mg | INHALATION_SOLUTION | Freq: Once | RESPIRATORY_TRACT | Status: AC
  Administered 2017-07-13: 5 mg via RESPIRATORY_TRACT
  Filled 2017-07-13: qty 6

## 2017-07-13 MED ORDER — DILTIAZEM HCL 60 MG PO TABS
60.0000 mg | ORAL_TABLET | Freq: Once | ORAL | Status: AC
  Administered 2017-07-13: 60 mg via ORAL
  Filled 2017-07-13: qty 1

## 2017-07-13 MED ORDER — CETIRIZINE HCL 10 MG PO CAPS: 10.0000 mg | ORAL_CAPSULE | Freq: Every day | ORAL | 0 refills | Status: DC

## 2017-07-13 MED ORDER — SODIUM CHLORIDE 0.9 % IV BOLUS (SEPSIS)
500.0000 mL | Freq: Once | INTRAVENOUS | Status: AC
  Administered 2017-07-13: 500 mL via INTRAVENOUS

## 2017-07-13 MED ORDER — PREDNISONE 20 MG PO TABS: 40.0000 mg | ORAL_TABLET | Freq: Every day | ORAL | 0 refills | Status: DC

## 2017-07-13 MED ORDER — DILTIAZEM HCL 25 MG/5ML IV SOLN
10.0000 mg | Freq: Once | INTRAVENOUS | Status: AC
  Administered 2017-07-13: 10 mg via INTRAVENOUS
  Filled 2017-07-13: qty 5

## 2017-07-13 MED ORDER — BENZONATATE 100 MG PO CAPS: 100.0000 mg | ORAL_CAPSULE | Freq: Three times a day (TID) | ORAL | 0 refills | Status: DC

## 2017-07-13 MED ORDER — PREDNISONE 20 MG PO TABS
60.0000 mg | ORAL_TABLET | Freq: Once | ORAL | Status: AC
  Administered 2017-07-13: 60 mg via ORAL
  Filled 2017-07-13: qty 3

## 2017-07-13 NOTE — ED Provider Notes (Signed)
[facility] EMERGENCY DEPARTMENT Provider Note   CSN: [id number] Arrival date & time: 07/13/17  8546     History   Chief Complaint Chief Complaint  Patient presents with  . Cough    HPI [name] is a 66 y.o. female.  Patient with history of atrial fibrillation on rivaroxaban, asthma --presents with increasing shortness of breath and wheezing over the past several days.  Patient was seen in the emergency department on 06/28/17 and again on 06/30/17.  On 11/6 visit she was given doxycycline for right middle lobe pneumonia.  She was given steroids on 11/8.  Patient states that these treatments improved her symptoms for about a week.  Symptoms then returned.  She has had subjective fevers.  Cough is nonproductive.  She c/o pain over her entire bilateral back, not worse with cough. She complains of nasal congestion.  Wheezing persists despite use of her home nebulizer.  She denies chest pain or abdominal pain.  No other medical complaints. The onset of this condition was acute. The course is constant. Aggravating factors: none. Alleviating factors: none.        Past Medical History:  Diagnosis Date  . A-fib ([place])   . Allergy    seasonal  . Asthma   . Chronic anticoagulation   . GERD (gastroesophageal reflux disease)   . Hypertension   . PAF (paroxysmal atrial fibrillation) ([place]) 11/26/2015   April/May 2017: Multiple ED visits for A. Fib.  02/12/16: Improved on diltiazem IV. Cardiology consulted in the ED and discharge patient with diltiazem XR 120 mg once daily with 60 mg by mouth every 6 hours as needed for symptomatic palpitations, flecainide 50 mg twice daily. Referred to the atrial fibrillation clinic.  02/17/16: Seen in the atrial fibrillation clinic by NP Roderic Palau. Normal sin    Patient Active Problem List   Diagnosis Date Noted  . Allergic contact dermatitis 04/27/2017  . Osteoporosis without current pathological fracture 04/15/2017  . Common cold  04/15/2017  . Constipation 10/19/2016  . Normocytic anemia 10/19/2016  . Rectal bleeding 10/19/2016  . Excessive cerumen in both ear canals 09/10/2016  . Hyperlipidemia 03/18/2016  . Special screening for malignant neoplasms, colon 03/17/2016  . Eczema 03/17/2016  . Cervical cancer screening 03/17/2016  . Assistance with transportation 03/17/2016  . Hypertension   . GERD (gastroesophageal reflux disease)   . PAF (paroxysmal atrial fibrillation) ([place]) 11/26/2015  . Chronic anticoagulation 11/26/2015  . Folliculitis 27/10/5007  . Marijuana abuse 06/11/2015  . Asthma, mild intermittent     Past Surgical History:  Procedure Laterality Date  . TONSILLECTOMY    . TUBAL LIGATION      OB History    No data available       Home Medications    Prior to Admission medications   Medication Sig Start Date End Date Taking? Authorizing Provider  albuterol (PROVENTIL HFA) 108 (90 Base) MCG/ACT inhaler Inhale 2 puffs every 4 (four) hours as needed into the lungs for wheezing or shortness of breath. 06/28/17   [name], DO  albuterol (PROVENTIL) (2.5 MG/3ML) 0.083% nebulizer solution Take 3 mLs (2.5 mg total) every 6 (six) hours as needed by nebulization for wheezing or shortness of breath. 06/28/17   [name], DO  benzonatate (TESSALON) 100 MG capsule Take 1 capsule (100 mg total) every 8 (eight) hours by mouth. Patient taking differently: Take 100 mg 3 (three) times daily as needed by mouth for cough.  06/28/17   [name],  DO  CARTIA XT 120 MG 24 hr capsule TAKE 1 CAPSULE(120 MG) BY MOUTH DAILY Patient taking differently: TAKE 1 CAPSULE(120 MG) BY MOUTH evening 12/17/16   Sherran Needs, NP  diltiazem (CARDIZEM) 60 MG tablet TAKE 1 TABLET BY MOUTH EVERY 6 HOURS AS NEEDED FOR RAPID AFIB HEART RATE OVER 100 06/10/17   Sherran Needs, NP  diphenhydrAMINE (BENADRYL) 25 MG tablet Take 25 mg by mouth 2 (two) times daily as needed for allergies (scratchy throat).    [provider]    doxycycline (VIBRAMYCIN) 100 MG capsule Take 1 capsule (100 mg total) 2 (two) times daily by mouth. 06/28/17   [name], DO  flecainide (TAMBOCOR) 100 MG tablet Take 1 tablet (100 mg total) by mouth 2 (two) times daily. 04/27/17   Lacroce, Hulen Shouts, MD  fluticasone (FLONASE) 50 MCG/ACT nasal spray Place 1 spray daily into both nostrils. 06/28/17 06/28/18  [name], DO  hydrOXYzine (ATARAX/VISTARIL) 25 MG tablet Take 1 tablet (25 mg total) by mouth every 6 (six) hours as needed for itching. 05/26/17   [name], MD  hydrOXYzine (VISTARIL) 25 MG capsule Take 1-2 capsules (25-50 mg total) by mouth every 8 (eight) hours as needed. Patient not taking: Reported on 06/30/2017 06/17/17   [name], MD  losartan (COZAAR) 50 MG tablet Take 1 tablet (50 mg total) by mouth daily. 06/17/17   [name], MD  polyethylene glycol powder (GLYCOLAX/MIRALAX) powder Mix 17g of Miralax in 8 ounces of water every other day 06/28/17   [name], DO  potassium chloride SA (K-DUR,KLOR-CON) 10 MEQ tablet Take 2 tablets (20 mEq total) by mouth daily. Patient taking differently: Take 10 mEq by mouth 2 (two) times daily.  11/12/16   Sherran Needs, NP  ranitidine (ZANTAC) 150 MG tablet TAKE 1 TABLET BY MOUTH TWICE DAILY Patient taking differently: TAKE 150 MG BY MOUTH TWICE DAILY AS NEEDED FOR ACID REFLUX 03/16/17   Axel Filler, MD  rivaroxaban (XARELTO) 20 MG TABS tablet Take 1 tablet (20 mg total) by mouth daily with supper. 03/15/17   Sherran Needs, NP  senna-docusate (SENNA S) 8.6-50 MG tablet Take 1 tablet by mouth 2 (two) times daily. 02/16/17   [name], MD  tiotropium (SPIRIVA HANDIHALER) 18 MCG inhalation capsule INHALE THE CONTENTS OF 1 CAPSULE(18 MCG) VIA INHALATION DEVICE EVERY DAY 06/28/17   [name], DO  triamcinolone (KENALOG) 0.025 % cream Apply 1 application topically 2 (two) times daily. For 2 weeks. Patient not taking: Reported on 06/30/2017 06/17/17   [name], MD     Family History Family History  Problem Relation Age of Onset  . Heart attack Mother        [name] age  . Breast cancer Sister        Late 13s; now s/p mastectomy   . Lung cancer Brother        x 2  . Cancer Sister   . Cancer Sister   . Cancer Sister   . Colon cancer Neg Hx   . Colon polyps Neg Hx   . Esophageal cancer Neg Hx   . Rectal cancer Neg Hx   . Stomach cancer Neg Hx     Social History Social History   Tobacco Use  . Smoking status: Former Smoker    Types: Cigarettes    Last attempt to quit: 10/24/2015    Years since quitting: 1.7  . Smokeless tobacco: Never Used  Substance Use Topics  . Alcohol  use: No  . Drug use: No    Comment: Smoked marijuana in the past.     Allergies   Protonix [pantoprazole sodium] and Celecoxib   Review of Systems Review of Systems  Constitutional: Negative for fever (none documented).  HENT: Positive for congestion. Negative for rhinorrhea and sore throat.   Eyes: Negative for redness.  Respiratory: Positive for cough, shortness of breath and wheezing.   Cardiovascular: Negative for chest pain.  Gastrointestinal: Negative for abdominal pain, diarrhea, nausea and vomiting.  Genitourinary: Negative for dysuria.  Musculoskeletal: Positive for back pain. Negative for myalgias.  Skin: Negative for rash.  Neurological: Positive for headaches.     Physical Exam Updated Vital Signs BP (!) 174/104 (BP Location: Left Arm)   Pulse 82   Temp 98.2 F (36.8 C) (Oral)   Resp (!) 27   SpO2 99%   Physical Exam  Constitutional: She appears well-developed and well-nourished.  HENT:  Head: Normocephalic and atraumatic.  Nose: No mucosal edema or rhinorrhea.  Mouth/Throat: Oropharynx is clear and moist.  Eyes: Conjunctivae are normal. Right eye exhibits no discharge. Left eye exhibits no discharge.  Neck: Normal range of motion. Neck supple.  Cardiovascular: Normal rate, regular rhythm and normal heart sounds.  Pulmonary/Chest:  Effort normal. No stridor. Tachypnea noted. No respiratory distress. She has wheezes (moderate, scattered, bilateral). She has no rales.  Abdominal: Soft. There is no tenderness. There is no rebound and no guarding.  Neurological: She is alert.  Skin: Skin is warm and dry.  Psychiatric: She has a normal mood and affect.  Nursing note and vitals reviewed.    ED Treatments / Results  Labs (all labs ordered are listed, but only abnormal results are displayed) Labs Reviewed  CBC WITH DIFFERENTIAL/PLATELET - Abnormal; Notable for the following components:      Result Value   RDW 15.8 (*)    All other components within normal limits  BASIC METABOLIC PANEL    EKG  EKG Interpretation  Date/Time:  Wednesday July 13 2017 11:31:18 EST Ventricular Rate:  145 PR Interval:    QRS Duration: 103 QT Interval:  339 QTC Calculation: 499 R Axis:   21 Text Interpretation:  Atrial flutter Paired ventricular premature complexes RSR' in V1 or V2, probably normal variant Repolarization abnormality, prob rate related Atrial fibrillation Confirmed by Thomasene Lot, [place] [phone]) on 07/13/2017 1:13:22 PM       Radiology Dg Chest 2 View  Result Date: 07/13/2017 CLINICAL DATA:  Cough and congestion for 2 weeks EXAM: CHEST  2 VIEW COMPARISON:  06/30/2017 FINDINGS: Cardiac shadow is within normal limits. Mild aortic calcifications are seen. The lungs are well aerated bilaterally. No focal infiltrate or sizable effusion is seen. No acute bony abnormality is noted. IMPRESSION: No active cardiopulmonary disease. Electronically Signed   By: Inez Catalina M.D.   On: 07/13/2017 09:25    Procedures Procedures (including critical care time)  Medications Ordered in ED Medications  albuterol (PROVENTIL) (2.5 MG/3ML) 0.083% nebulizer solution 5 mg (5 mg Nebulization Given 07/13/17 0841)  ipratropium (ATROVENT) nebulizer solution 0.5 mg (0.5 mg Nebulization Given 07/13/17 0841)  predniSONE (DELTASONE) tablet  60 mg (60 mg Oral Given 07/13/17 0841)  albuterol (PROVENTIL) (2.5 MG/3ML) 0.083% nebulizer solution 5 mg (5 mg Nebulization Given 07/13/17 1004)  ipratropium (ATROVENT) nebulizer solution 0.5 mg (0.5 mg Nebulization Given 07/13/17 1004)  acetaminophen (TYLENOL) tablet 650 mg (650 mg Oral Given 07/13/17 1004)  diltiazem (CARDIZEM) tablet 60 mg (60 mg Oral Given 07/13/17 1212)  sodium chloride 0.9 % bolus 500 mL (0 mLs Intravenous Stopped 07/13/17 1431)  diltiazem (CARDIZEM) injection 10 mg (10 mg Intravenous Given 07/13/17 1535)     Initial Impression / Assessment and Plan / ED Course  I have reviewed the triage vital signs and the nursing notes.  Pertinent labs & imaging results that were available during my care of the patient were reviewed by me and considered in my medical decision making (see chart for details).     Patient seen and examined. Work-up initiated. Medications ordered.  The patient is not in any respiratory distress.  Speaking in full sentences without difficulty.  Vital signs reviewed and are as follows: BP (!) 174/104 (BP Location: Left Arm)   Pulse 82   Temp 98.2 F (36.8 C) (Oral)   Resp (!) 27   SpO2 99%   Patient received 2 breathing treatments. Subsequently felt better -- but went into rapid afib. Patient takes 60mg  PO Cardizem at home when this happens. Ordered.    2:09 PM patient persistently tachycardic. IV cardizem requested.   She is stable however, comfortable, just finished lunch.   4:40 PM patient now in normal sinus rhythm with frequent PACs, rate 90-110.  Patient states that she is feeling better.  She is asymptomatic.  She requests to be discharged home so she can take her anticoagulation and antiarrhythmic.  She is comfortable with discharge and does not want to stay in the hospital.  Patient is rate controlled and cleared for discharge at this time.  BP 139/72   Pulse 80   Temp 98.2 F (36.8 C) (Oral)   Resp (!) 22   SpO2 98%   Home  with prednisone, cetirizine, Tessalon for exacerbation of underlying asthma.  No indication for antibiotics at this time.   Final Clinical Impressions(s) / ED Diagnoses   Final diagnoses:  Exacerbation of asthma, unspecified asthma severity, unspecified whether persistent  Atrial fibrillation with RVR ([place])   Patient with bronchospasm, no pneumonia.  Recently treated for pneumonia.  No fever to suggest influenza or other viral illness.  With wheezing on arrival, improved with albuterol.  Unfortunately, patient went into rapid atrial fibrillation.  This was not improved with oral Cardizem, but well controlled with IV Cardizem.  Patient remains rate controlled and asymptomatic.  This is not unusual state for her.  Patient is anticoagulated appropriately.  Given that she is feeling well and would like to be discharged, I see no reason why she cannot be discharged at this time.  Patient seems reliable to return with tachypalpitations not responsive to her typical home medications.  ED Discharge Orders        Ordered    predniSONE (DELTASONE) 20 MG tablet  Daily     07/13/17 1638    Cetirizine HCl 10 MG CAPS  Daily     07/13/17 1638    benzonatate (TESSALON) 100 MG capsule  Every 8 hours     07/13/17 1638       Carlisle Cater, PA-C 07/13/17 1643    Carlisle Cater, PA-C 07/13/17 1659    [place]ack[place], Fredia Sorrow, MD 07/14/17 2330

## 2017-07-13 NOTE — ED Notes (Signed)
Regular diet lunch tray ordered @ 1206. 

## 2017-07-13 NOTE — Discharge Instructions (Signed)
Please read and follow all provided instructions.  Your diagnoses today include:  1. Exacerbation of asthma, unspecified asthma severity, unspecified whether persistent   2. Atrial fibrillation with RVR ([facility])     Tests performed today include:  Chest x-ray - no pneumonia  Vital signs. See below for your results today.   Medications prescribed:   Prednisone - steroid medicine   It is best to take this medication in the morning to prevent sleeping problems. If you are diabetic, monitor your blood sugar closely and stop taking Prednisone if blood sugar is over 300. Take with food to prevent stomach upset.    Tessalon Perles - cough suppressant medication  Take any prescribed medications only as directed.  Home care instructions:  Follow any educational materials contained in this packet.  Follow-up instructions: Please follow-up with your primary care provider in the next 3 days for further evaluation of your symptoms.   Return instructions:   Please return to the Emergency Department if you experience worsening symptoms.   Return with persistent palpitations not relieved with your home medications, shortness of breath, chest pain.  Please return if you have any other emergent concerns.  Additional Information:  Your vital signs today were: BP 139/72    Pulse 80    Temp 98.2 F (36.8 C) (Oral)    Resp (!) 22    SpO2 98%  If your blood pressure (BP) was elevated above 135/85 this visit, please have this repeated by your doctor within one month. --------------

## 2017-07-13 NOTE — ED Triage Notes (Signed)
Pt presents for evaluation of cough and wheezing x 1 week. Reports seen last week and dx with PNA, reports completing abx with no improvement. Hx of asthma, last inhaler use last PM.

## 2017-07-20 ENCOUNTER — Other Ambulatory Visit: Payer: Self-pay

## 2017-07-20 MED ORDER — POLYETHYLENE GLYCOL 3350 17 GM/SCOOP PO POWD
ORAL | 0 refills | Status: DC
Start: 2017-07-20 — End: 2018-10-14

## 2017-07-20 NOTE — Telephone Encounter (Signed)
polyethylene glycol powder (GLYCOLAX/MIRALAX) powder, refill request @ [facility] on [address].

## 2017-07-22 ENCOUNTER — Ambulatory Visit: Payer: Self-pay

## 2017-08-02 ENCOUNTER — Ambulatory Visit (HOSPITAL_COMMUNITY): Payer: Self-pay | Admitting: Nurse Practitioner

## 2017-08-03 ENCOUNTER — Other Ambulatory Visit: Payer: Self-pay | Admitting: Internal Medicine

## 2017-08-03 NOTE — Telephone Encounter (Signed)
Refill Request   albuterol (PROVENTIL HFA) 108 (90 Base) MCG/ACT inhaler    albuterol (PROVENTIL) (2.5 MG/3ML) 0.083% nebulizer solution

## 2017-08-04 MED ORDER — ALBUTEROL SULFATE HFA 108 (90 BASE) MCG/ACT IN AERS: 2.0000 | INHALATION_SPRAY | RESPIRATORY_TRACT | 0 refills | Status: DC | PRN

## 2017-08-05 ENCOUNTER — Ambulatory Visit (INDEPENDENT_AMBULATORY_CARE_PROVIDER_SITE_OTHER): Payer: Medicare Other | Admitting: Internal Medicine

## 2017-08-05 ENCOUNTER — Other Ambulatory Visit: Payer: Self-pay

## 2017-08-05 ENCOUNTER — Encounter: Payer: Self-pay | Admitting: Internal Medicine

## 2017-08-05 VITALS — BP 143/84 | HR 74 | Temp 97.8°F | Ht 67.0 in | Wt 165.9 lb

## 2017-08-05 DIAGNOSIS — Z7951 Long term (current) use of inhaled steroids: Secondary | ICD-10-CM

## 2017-08-05 DIAGNOSIS — J4520 Mild intermittent asthma, uncomplicated: Secondary | ICD-10-CM | POA: Diagnosis not present

## 2017-08-05 DIAGNOSIS — I10 Essential (primary) hypertension: Secondary | ICD-10-CM

## 2017-08-05 DIAGNOSIS — I480 Paroxysmal atrial fibrillation: Secondary | ICD-10-CM

## 2017-08-05 DIAGNOSIS — Z79899 Other long term (current) drug therapy: Secondary | ICD-10-CM | POA: Diagnosis not present

## 2017-08-05 DIAGNOSIS — Z87891 Personal history of nicotine dependence: Secondary | ICD-10-CM | POA: Diagnosis not present

## 2017-08-05 MED ORDER — PREDNISONE 10 MG (48) PO TBPK: ORAL_TABLET | ORAL | 0 refills | Status: DC

## 2017-08-05 MED ORDER — FLUTICASONE-SALMETEROL 250-50 MCG/DOSE IN AEPB: 1.0000 | INHALATION_SPRAY | Freq: Two times a day (BID) | RESPIRATORY_TRACT | 3 refills | Status: DC

## 2017-08-05 MED ORDER — MONTELUKAST SODIUM 10 MG PO TABS: 10.0000 mg | ORAL_TABLET | Freq: Every day | ORAL | 2 refills | Status: DC

## 2017-08-05 MED ORDER — IPRATROPIUM BROMIDE 0.02 % IN SOLN
0.5000 mg | Freq: Once | RESPIRATORY_TRACT | Status: AC
  Administered 2017-08-05: 0.5 mg via RESPIRATORY_TRACT

## 2017-08-05 MED ORDER — AZITHROMYCIN 250 MG PO TABS: ORAL_TABLET | ORAL | 0 refills | Status: DC

## 2017-08-05 MED ORDER — ALBUTEROL SULFATE (2.5 MG/3ML) 0.083% IN NEBU
2.5000 mg | INHALATION_SOLUTION | Freq: Once | RESPIRATORY_TRACT | Status: AC
  Administered 2017-08-05: 2.5 mg via RESPIRATORY_TRACT

## 2017-08-05 NOTE — Assessment & Plan Note (Signed)
BP Readings from Last 3 Encounters:  08/05/17 (!) 143/84  07/13/17 135/73  06/30/17 (!) 172/87   Her blood pressure was mildly elevated, she was in mild distress because of asthma exacerbation.  Her [place] dose was increased to 50 mg daily during previous office visit.  We will reevaluate during next follow-up visit.

## 2017-08-05 NOTE — Assessment & Plan Note (Signed)
She was in sinus rhythm.  She follow-up at A. fib clinic.  Continue current management.

## 2017-08-05 NOTE — Assessment & Plan Note (Signed)
She had recurrent asthma exacerbations. Her wheezing improved with DuoNeb treatment in clinic.  -She was given a course of prednisone with a longer taper. -Azithromycin for 5 days. -Advair Diskus 250-50 to be used twice daily. -Albuterol inhaler as needed. -Singular 10 mg daily. -Follow-up if her symptoms get worse or fail to improve.

## 2017-08-05 NOTE — Patient Instructions (Addendum)
Thank you for visiting clinic today. We are making some changes to your as the more medicine because of more frequent exacerbations.  Please take all medicines as directed. I am not making any changes to your blood pressure medicine although it was little high, it might be due to your asthma exacerbation. Please follow-up in 4 weeks for your blood pressure check.

## 2017-08-05 NOTE — Progress Notes (Signed)
   CC: Shortness of breath.  HPI:  [name] is a 66 y.o. with past medical history as listed below came to the clinic with complaint of shortness of breath.  The patient has an history of intermittent asthma which normally get worse during winter months.  She had 3 ED visits within the past month because of eczema exacerbation, she was given a course of doxycycline along with prednisone during first visit in early November 2018, on subsequent visit she was given short courses of prednisone with albuterol nebulizer and inhaler to be used as needed. Patient was using her albuterol inhaler every day and she ran out of her inhaler and nebulizer supply for the past 2 days. Her prescription refill request was addressed 2 days ago but patient never picked up her medicine. She was also complaining of mostly dry cough, and occasional sinus congestion with no drainage.  She was also complaining of headache but denies any change in vision.  She denies any chest pain, orthopnea or PND.  She denies any palpitations. During previous ED visit she developed A. fib with RVR with albuterol continuous nebulizer which was treated with Cardizem.  Past Medical History:  Diagnosis Date  . A-fib ([place])   . Allergy    seasonal  . Asthma   . Chronic anticoagulation   . GERD (gastroesophageal reflux disease)   . Hypertension   . PAF (paroxysmal atrial fibrillation) ([place]) 11/26/2015   April/May 2017: Multiple ED visits for A. Fib.  02/12/16: Improved on diltiazem IV. Cardiology consulted in the ED and discharge patient with diltiazem XR 120 mg once daily with 60 mg by mouth every 6 hours as needed for symptomatic palpitations, flecainide 50 mg twice daily. Referred to the atrial fibrillation clinic.  02/17/16: Seen in the atrial fibrillation clinic by NP Roderic Palau. Normal sin   Review of Systems: Negative except mentioned in HPI.  Physical Exam:  Vitals:   08/05/17 1434  BP: (!) 143/84  Pulse: 74  Temp:  97.8 F (36.6 C)  TempSrc: Oral  SpO2: 99%  Weight: 165 lb 14.4 oz (75.3 kg)  Height: 5\' 7"  (1.702 m)   Vitals:   08/05/17 1434  BP: (!) 143/84  Pulse: 74  Temp: 97.8 F (36.6 C)  TempSrc: Oral  SpO2: 99%  Weight: 165 lb 14.4 oz (75.3 kg)  Height: 5\' 7"  (1.702 m)   General: Vital signs reviewed.  Patient is well-developed and well-nourished, in mild distress and cooperative with exam.  Head: Normocephalic and atraumatic. Eyes: EOMI, conjunctivae normal, no scleral icterus.  Cardiovascular: RRR, S1 normal, S2 normal, no murmurs, gallops, or rubs. Pulmonary/Chest: Bilateral expiratory wheeze.  Improved with DuoNeb treatment. Abdominal: Soft, non-tender, non-distended, BS +, no masses, organomegaly, or guarding present.  Extremities: No lower extremity edema bilaterally,  pulses symmetric and intact bilaterally. No cyanosis or clubbing. Psychiatric: Normal mood and affect. speech and behavior is normal. Cognition and memory are normal.  Assessment & Plan:   See Encounters Tab for problem based charting.  Patient discussed with Dr. Beryle Beams.

## 2017-08-06 ENCOUNTER — Emergency Department (HOSPITAL_COMMUNITY)
Admission: EM | Admit: 2017-08-06 | Discharge: 2017-08-06 | Disposition: A | Discharge status: Home / Self Care | Payer: Medicare Other | Attending: Emergency Medicine | Admitting: Emergency Medicine

## 2017-08-06 ENCOUNTER — Other Ambulatory Visit: Payer: Self-pay

## 2017-08-06 ENCOUNTER — Emergency Department (HOSPITAL_COMMUNITY): Payer: Medicare Other

## 2017-08-06 ENCOUNTER — Encounter (HOSPITAL_COMMUNITY): Payer: Self-pay | Admitting: Emergency Medicine

## 2017-08-06 DIAGNOSIS — Z79899 Other long term (current) drug therapy: Secondary | ICD-10-CM | POA: Insufficient documentation

## 2017-08-06 DIAGNOSIS — I10 Essential (primary) hypertension: Secondary | ICD-10-CM | POA: Insufficient documentation

## 2017-08-06 DIAGNOSIS — R000 Tachycardia, unspecified: Secondary | ICD-10-CM | POA: Diagnosis not present

## 2017-08-06 DIAGNOSIS — R079 Chest pain, unspecified: Secondary | ICD-10-CM | POA: Diagnosis not present

## 2017-08-06 DIAGNOSIS — Z87891 Personal history of nicotine dependence: Secondary | ICD-10-CM | POA: Insufficient documentation

## 2017-08-06 DIAGNOSIS — Z7901 Long term (current) use of anticoagulants: Secondary | ICD-10-CM | POA: Diagnosis not present

## 2017-08-06 DIAGNOSIS — R05 Cough: Secondary | ICD-10-CM | POA: Diagnosis not present

## 2017-08-06 DIAGNOSIS — J4541 Moderate persistent asthma with (acute) exacerbation: Secondary | ICD-10-CM | POA: Diagnosis not present

## 2017-08-06 DIAGNOSIS — I480 Paroxysmal atrial fibrillation: Secondary | ICD-10-CM

## 2017-08-06 DIAGNOSIS — R062 Wheezing: Secondary | ICD-10-CM | POA: Diagnosis present

## 2017-08-06 LAB — URINALYSIS, ROUTINE W REFLEX MICROSCOPIC
Bilirubin Urine: NEGATIVE
Glucose, UA: NEGATIVE mg/dL
Ketones, ur: NEGATIVE mg/dL
Leukocytes, UA: NEGATIVE
Nitrite: NEGATIVE
PROTEIN: NEGATIVE mg/dL
Specific Gravity, Urine: 1.014 (ref 1.005–1.030)
pH: 6 (ref 5.0–8.0)

## 2017-08-06 LAB — BASIC METABOLIC PANEL
ANION GAP: 11 (ref 5–15)
BUN: 14 mg/dL (ref 6–20)
CO2: 21 mmol/L — ABNORMAL LOW (ref 22–32)
Calcium: 9.5 mg/dL (ref 8.9–10.3)
Chloride: 109 mmol/L (ref 101–111)
Creatinine, Ser: 0.73 mg/dL (ref 0.44–1.00)
GFR calc Af Amer: >60 mL/min (ref >60)
Glucose, Bld: 92 mg/dL (ref 65–99)
POTASSIUM: 3.6 mmol/L (ref 3.5–5.1)
SODIUM: 141 mmol/L (ref 135–145)

## 2017-08-06 LAB — CBC WITH DIFFERENTIAL/PLATELET
BASOS ABS: 0 10*3/uL (ref 0.0–0.1)
BASOS PCT: 1 %
EOS PCT: 6 %
Eosinophils Absolute: 0.2 10*3/uL (ref 0.0–0.7)
HEMATOCRIT: 40.1 % (ref 36.0–46.0)
Hemoglobin: 13 g/dL (ref 12.0–15.0)
Lymphocytes Relative: 34 %
Lymphs Abs: 1.3 10*3/uL (ref 0.7–4.0)
MCH: 26.6 pg (ref 26.0–34.0)
MCHC: 32.4 g/dL (ref 30.0–36.0)
MCV: 82.2 fL (ref 78.0–100.0)
Monocytes Absolute: 0.3 10*3/uL (ref 0.1–1.0)
Monocytes Relative: 8 %
NEUTROS ABS: 2.1 10*3/uL (ref 1.7–7.7)
Neutrophils Relative %: 51 %
PLATELETS: 220 10*3/uL (ref 150–400)
RBC: 4.88 MIL/uL (ref 3.87–5.11)
RDW: 15.5 % (ref 11.5–15.5)
WBC: 4 10*3/uL (ref 4.0–10.5)

## 2017-08-06 LAB — I-STAT TROPONIN, ED: Troponin i, poc: 0 ng/mL (ref 0.00–0.08)

## 2017-08-06 LAB — BRAIN NATRIURETIC PEPTIDE: B NATRIURETIC PEPTIDE 5: 43 pg/mL (ref 0.0–100.0)

## 2017-08-06 MED ORDER — ACETAMINOPHEN 500 MG PO TABS
1000.0000 mg | ORAL_TABLET | Freq: Once | ORAL | Status: AC
  Administered 2017-08-06: 1000 mg via ORAL
  Filled 2017-08-06: qty 2

## 2017-08-06 NOTE — ED Provider Notes (Signed)
[facility]ENC[facility] DEPARTMENT Provider Note   CSN: [id number] Arrival date & time: 08/06/17  0530     History   Chief Complaint Chief Complaint  Patient presents with  . Chest Pain  . Asthma    HPI [name] is a 66 y.o. female.  With a past medical history of asthma and paroxysmal atrial fibrillation on chronic anticoagulation with Xarelto who presents the emergency department chief complaint of asthma exacerbation and A. fib with RVR.  Patient states that she woke up from sleep around 1 AM wheezing.  She took a DuoNeb treatment.  About an hour later she felt her heart go into A. fib with RVR and she took a diltiazem 60 mg tablet.  She woke up about 3 hours later and noted that she was still in A. fib, repeated her dose of diltiazem and waited for about an hour.  She did not improve so she decided to call 911.  She was noted to have some continued wheezing and was given another DuoNeb treatment by EMS.  The patient was noted to be in A. fib with RVR at a rate of 180.  Her rate self resolved. She was seen yesterday by her PCP and started on singulair, Advair, zpack, and sterapred dosepak. The patient has not filled or started on these medications.  HPI  Past Medical History:  Diagnosis Date  . A-fib ([place])   . Allergy    seasonal  . Asthma   . Chronic anticoagulation   . GERD (gastroesophageal reflux disease)   . Hypertension   . PAF (paroxysmal atrial fibrillation) ([place]) 11/26/2015   April/May 2017: Multiple ED visits for A. Fib.  02/12/16: Improved on diltiazem IV. Cardiology consulted in the ED and discharge patient with diltiazem XR 120 mg once daily with 60 mg by mouth every 6 hours as needed for symptomatic palpitations, flecainide 50 mg twice daily. Referred to the atrial fibrillation clinic.  02/17/16: Seen in the atrial fibrillation clinic by NP Roderic Palau. Normal sin    Patient Active Problem List   Diagnosis Date Noted  . Allergic contact  dermatitis 04/27/2017  . Osteoporosis without current pathological fracture 04/15/2017  . Constipation 10/19/2016  . Normocytic anemia 10/19/2016  . Hyperlipidemia 03/18/2016  . Special screening for malignant neoplasms, colon 03/17/2016  . Eczema 03/17/2016  . Cervical cancer screening 03/17/2016  . Assistance with transportation 03/17/2016  . Hypertension   . GERD (gastroesophageal reflux disease)   . PAF (paroxysmal atrial fibrillation) ([place]) 11/26/2015  . Chronic anticoagulation 11/26/2015  . Folliculitis 29/51/8841  . Marijuana abuse 06/11/2015  . Asthma, mild intermittent     Past Surgical History:  Procedure Laterality Date  . TONSILLECTOMY    . TUBAL LIGATION      OB History    No data available       Home Medications    Prior to Admission medications   Medication Sig Start Date End Date Taking? Authorizing Provider  albuterol (PROVENTIL HFA) 108 (90 Base) MCG/ACT inhaler Inhale 2 puffs into the lungs every 4 (four) hours as needed for wheezing or shortness of breath. 08/04/17  Yes [name], MD  albuterol (PROVENTIL) (2.5 MG/3ML) 0.083% nebulizer solution USE 3 ML VIA NEBULIZER EVERY 6 HOURS AS NEEDED FOR WHEEZING OR SHORTNESS OF BREATH 08/03/17  Yes [name], MD  diltiazem (CARDIZEM) 60 MG tablet TAKE 1 TABLET BY MOUTH EVERY 6 HOURS AS NEEDED FOR RAPID AFIB HEART RATE OVER 100 06/10/17  Yes  Sherran Needs, NP  flecainide (TAMBOCOR) 100 MG tablet Take 1 tablet (100 mg total) by mouth 2 (two) times daily. 04/27/17  Yes Lacroce, Hulen Shouts, MD  losartan (COZAAR) 50 MG tablet Take 1 tablet (50 mg total) by mouth daily. 06/17/17  Yes [name], MD  polyethylene glycol powder (GLYCOLAX/MIRALAX) powder Mix 17g of Miralax in 8 ounces of water every other day 07/20/17  Yes [name], MD  potassium chloride SA (K-DUR,KLOR-CON) 10 MEQ tablet Take 2 tablets (20 mEq total) by mouth daily. Patient taking differently: Take 10 mEq by mouth 2 (two) times daily.   11/12/16  Yes Sherran Needs, NP  rivaroxaban (XARELTO) 20 MG TABS tablet Take 1 tablet (20 mg total) by mouth daily with supper. 03/15/17  Yes Sherran Needs, NP  tiotropium (SPIRIVA HANDIHALER) 18 MCG inhalation capsule INHALE THE CONTENTS OF 1 CAPSULE(18 MCG) VIA INHALATION DEVICE EVERY DAY 06/28/17  Yes [name], DO  azithromycin (ZITHROMAX Z-PAK) 250 MG tablet Take 2 tablets (500 mg) on  Day 1,  followed by 1 tablet (250 mg) once daily on Days 2 through 5. Patient not taking: Reported on 08/06/2017 08/05/17 08/10/17  [name], MD  benzonatate (TESSALON) 100 MG capsule Take 1 capsule (100 mg total) by mouth every 8 (eight) hours. Patient not taking: Reported on 08/06/2017 07/13/17   Carlisle Cater, PA-C  CARTIA XT 120 MG 24 hr capsule TAKE 1 CAPSULE(120 MG) BY MOUTH DAILY Patient not taking: Reported on 08/06/2017 12/17/16   Sherran Needs, NP  fluticasone [facility]) 50 MCG/ACT nasal spray Place 1 spray daily into both nostrils. Patient not taking: Reported on 08/06/2017 06/28/17 06/28/18  [name], DO  Fluticasone-Salmeterol (ADVAIR DISKUS) 250-50 MCG/DOSE AEPB Inhale 1 puff into the lungs 2 (two) times daily. 08/05/17 08/05/18  [name], MD  hydrOXYzine (ATARAX/VISTARIL) 25 MG tablet Take 1 tablet (25 mg total) by mouth every 6 (six) hours as needed for itching. Patient not taking: Reported on 08/06/2017 05/26/17   [name], MD  montelukast (SINGULAIR) 10 MG tablet Take 1 tablet (10 mg total) by mouth daily. 08/05/17 08/05/18  [name], MD  predniSONE (STERAPRED UNI-PAK 48 TAB) 10 MG (48) TBPK tablet Take 4 tablets daily for 5 days, decrease it to 3 tablets for another 5 days, 2 tablets for 5 days, then take 1 tablet for another 5 days before stopping. Patient not taking: Reported on 08/06/2017 08/05/17   [name], MD  ranitidine (ZANTAC) 150 MG tablet TAKE 1 TABLET BY MOUTH TWICE DAILY Patient not taking: Reported on 08/06/2017 03/16/17   Axel Filler, MD    triamcinolone (KENALOG) 0.025 % cream Apply 1 application topically 2 (two) times daily. For 2 weeks. Patient not taking: Reported on 08/06/2017 06/17/17   [name], MD    Family History Family History  Problem Relation Age of Onset  . Heart attack Mother        [name] age  . Breast cancer Sister        Late 19s; now s/p mastectomy   . Lung cancer Brother        x 2  . Cancer Sister   . Cancer Sister   . Cancer Sister   . Colon cancer Neg Hx   . Colon polyps Neg Hx   . Esophageal cancer Neg Hx   . Rectal cancer Neg Hx   . Stomach cancer Neg Hx     Social History Social History   Tobacco Use  .  Smoking status: Former Smoker    Types: Cigarettes    Last attempt to quit: 10/24/2015    Years since quitting: 1.7  . Smokeless tobacco: Never Used  Substance Use Topics  . Alcohol use: No  . Drug use: No    Comment: Smoked marijuana in the past.     Allergies   Protonix [pantoprazole sodium] and Celecoxib   Review of Systems Review of Systems Ten systems reviewed and are negative for acute change, except as noted in the HPI.   Physical Exam Updated Vital Signs BP 118/76   Pulse 62   Temp 98.2 F (36.8 C) (Oral)   Resp (!) 21   Ht 5\' 7"  (1.702 m)   Wt 74.8 kg (165 lb)   SpO2 96%   BMI 25.84 kg/m   Physical Exam  Constitutional: She is oriented to person, place, and time. She appears well-developed and well-nourished. No distress.  HENT:  Head: Normocephalic and atraumatic.  Eyes: Conjunctivae are normal. No scleral icterus.  Neck: Normal range of motion.  Cardiovascular: Normal rate, regular rhythm and normal heart sounds. Exam reveals no gallop and no friction rub.  No murmur heard. Pulmonary/Chest: Effort normal and breath sounds normal. No respiratory distress.  Abdominal: Soft. Bowel sounds are normal. She exhibits no distension and no mass. There is no tenderness. There is no guarding.  Neurological: She is alert and oriented to person, place, and  time.  Skin: Skin is warm and dry. She is not diaphoretic.  Psychiatric: Her behavior is normal.  Nursing note and vitals reviewed.    ED Treatments / Results  Labs (all labs ordered are listed, but only abnormal results are displayed) Labs Reviewed  URINALYSIS, ROUTINE W REFLEX MICROSCOPIC  BASIC METABOLIC PANEL  CBC WITH DIFFERENTIAL/PLATELET  BRAIN NATRIURETIC PEPTIDE  I-STAT TROPONIN, ED    EKG  EKG Interpretation None       Radiology Dg Chest 2 View  Result Date: 08/06/2017 CLINICAL DATA:  66 y/o female status post asthma attack this morning. Subsequent a fib. Reports chest congestion and nonproductive cough for 2 weeks. Chest tightness and shortness of breath. EXAM: CHEST  2 VIEW COMPARISON:  07/13/2017 and earlier. FINDINGS: Semi upright AP and lateral views of the chest. Stable lung volumes at the upper limits of normal to hyperinflated. Stable mild cardiomegaly. Other mediastinal contours are within normal limits. Visualized tracheal air column is within normal limits. No pneumothorax, pulmonary edema or pleural effusion. Linear atelectasis at both lung bases. No other confluent pulmonary opacity. No acute osseous abnormality identified. Negative visible bowel gas pattern. IMPRESSION: Mild atelectasis at both lung bases. No other acute cardiopulmonary abnormality. Electronically Signed   By: Genevie Ann M.D.   On: 08/06/2017 07:41    Procedures Procedures (including critical care time)  Medications Ordered in ED Medications - No data to display   Initial Impression / Assessment and Plan / ED Course  I have reviewed the triage vital signs and the nursing notes.  Pertinent labs & imaging results that were available during my care of the patient were reviewed by me and considered in my medical decision making (see chart for details).      patient EKG reviewed and no AFIB. She has had no recurrence of her A. fib.  She is breathing easily and asymptomatic throughout her  visit.  Patient appears appropriate for discharge at this time.   discussed return precautions.  **Pt  With asxs hematuria. I did not speak with the patient  about this finding, but have asked her pcp to follow her up next week for repeat UA**  Final Clinical Impressions(s) / ED Diagnoses   Final diagnoses:  Moderate persistent asthma with exacerbation  Paroxysmal atrial fibrillation with rapid ventricular response [facility])    ED Discharge Orders    None       Margarita Mail, PA-C 08/06/17 1047    Malvin Johns, MD 08/06/17 1124

## 2017-08-06 NOTE — ED Notes (Signed)
Patient transported to X-ray 

## 2017-08-06 NOTE — ED Triage Notes (Signed)
Patient brought by ems for c/o sob and chest pain.  Patient reports waking around 0100 sob and wheezy.  She used a duoneb at that time.  Woke back up around 0130 and felt like she was in afib.  Took Diltiazem 60mg  at that time.  Continued to beat fast so she repeated the dose of dilt at 0300.  Second duoneb given via ems en route.  Per ems pt was in afib w/rvr at a rate of 180 initially but converted pta.  Given asa 324mg  en route.

## 2017-08-06 NOTE — Discharge Instructions (Signed)
Please fill and take your medications as directed. Return for racing heart or difficulty breathing.

## 2017-08-07 ENCOUNTER — Emergency Department (HOSPITAL_COMMUNITY): Payer: Medicare Other

## 2017-08-07 ENCOUNTER — Encounter (HOSPITAL_COMMUNITY): Payer: Self-pay | Admitting: Emergency Medicine

## 2017-08-07 ENCOUNTER — Other Ambulatory Visit: Payer: Self-pay

## 2017-08-07 ENCOUNTER — Emergency Department (HOSPITAL_COMMUNITY)
Admission: EM | Admit: 2017-08-07 | Discharge: 2017-08-07 | Disposition: A | Discharge status: Home / Self Care | Payer: Medicare Other | Attending: Emergency Medicine | Admitting: Emergency Medicine

## 2017-08-07 DIAGNOSIS — R079 Chest pain, unspecified: Secondary | ICD-10-CM | POA: Diagnosis not present

## 2017-08-07 DIAGNOSIS — Z79899 Other long term (current) drug therapy: Secondary | ICD-10-CM | POA: Diagnosis not present

## 2017-08-07 DIAGNOSIS — T887XXA Unspecified adverse effect of drug or medicament, initial encounter: Secondary | ICD-10-CM | POA: Insufficient documentation

## 2017-08-07 DIAGNOSIS — J45901 Unspecified asthma with (acute) exacerbation: Secondary | ICD-10-CM | POA: Insufficient documentation

## 2017-08-07 DIAGNOSIS — R05 Cough: Secondary | ICD-10-CM | POA: Insufficient documentation

## 2017-08-07 DIAGNOSIS — R9431 Abnormal electrocardiogram [ECG] [EKG]: Secondary | ICD-10-CM | POA: Diagnosis not present

## 2017-08-07 DIAGNOSIS — T50905D Adverse effect of unspecified drugs, medicaments and biological substances, subsequent encounter: Secondary | ICD-10-CM | POA: Insufficient documentation

## 2017-08-07 DIAGNOSIS — T486X5A Adverse effect of antiasthmatics, initial encounter: Secondary | ICD-10-CM | POA: Insufficient documentation

## 2017-08-07 DIAGNOSIS — R002 Palpitations: Secondary | ICD-10-CM | POA: Diagnosis not present

## 2017-08-07 DIAGNOSIS — Z87891 Personal history of nicotine dependence: Secondary | ICD-10-CM | POA: Diagnosis not present

## 2017-08-07 DIAGNOSIS — Z7901 Long term (current) use of anticoagulants: Secondary | ICD-10-CM | POA: Insufficient documentation

## 2017-08-07 DIAGNOSIS — Y658 Other specified misadventures during surgical and medical care: Secondary | ICD-10-CM | POA: Diagnosis not present

## 2017-08-07 DIAGNOSIS — I10 Essential (primary) hypertension: Secondary | ICD-10-CM | POA: Insufficient documentation

## 2017-08-07 DIAGNOSIS — I4891 Unspecified atrial fibrillation: Secondary | ICD-10-CM | POA: Diagnosis present

## 2017-08-07 DIAGNOSIS — J309 Allergic rhinitis, unspecified: Secondary | ICD-10-CM | POA: Diagnosis not present

## 2017-08-07 LAB — CBC
HEMATOCRIT: 38.3 % (ref 36.0–46.0)
Hemoglobin: 12.5 g/dL (ref 12.0–15.0)
MCH: 26.7 pg (ref 26.0–34.0)
MCHC: 32.6 g/dL (ref 30.0–36.0)
MCV: 81.8 fL (ref 78.0–100.0)
PLATELETS: 237 10*3/uL (ref 150–400)
RBC: 4.68 MIL/uL (ref 3.87–5.11)
RDW: 15.5 % (ref 11.5–15.5)
WBC: 3.6 10*3/uL — AB (ref 4.0–10.5)

## 2017-08-07 LAB — BASIC METABOLIC PANEL
Anion gap: 7 (ref 5–15)
BUN: 16 mg/dL (ref 6–20)
CHLORIDE: 109 mmol/L (ref 101–111)
CO2: 22 mmol/L (ref 22–32)
CREATININE: 0.74 mg/dL (ref 0.44–1.00)
Calcium: 9.4 mg/dL (ref 8.9–10.3)
GFR calc Af Amer: >60 mL/min (ref >60)
GFR calc non Af Amer: >60 mL/min (ref >60)
Glucose, Bld: 90 mg/dL (ref 65–99)
POTASSIUM: 3.5 mmol/L (ref 3.5–5.1)
SODIUM: 138 mmol/L (ref 135–145)

## 2017-08-07 LAB — I-STAT TROPONIN, ED: Troponin i, poc: 0 ng/mL (ref 0.00–0.08)

## 2017-08-07 NOTE — ED Notes (Signed)
Pt. Ambulated to end of hall and back with SpO2 above 96

## 2017-08-07 NOTE — ED Notes (Signed)
ED Provider at bedside. 

## 2017-08-07 NOTE — ED Notes (Signed)
Pt reports she has been seen 3X this week for afib, came in for afib, pt NSR currently

## 2017-08-07 NOTE — Discharge Instructions (Signed)
Please keep your appointment tomorrow with your cardiologist.  Do not start taking the prednisone prescription that you filled until you are seen by cardiology. Do not use your Advair inhaler, nebulizer treatment or montelukast until you are seen by cardiology unless you feel very, very short of breath.   Sometimes asthma symptoms can be worse from mold or environmental conditions. If staying with your daughter is an option until you get your roof fixed, this might help with your asthma flare ups.   You can continue to take her azithromycin antibiotic, unless cardiology tells you to stop taking this medication tomorrow.  If you continue to feel constipated, please follow up with your primary care provider about making a change to your medications.  If you develop difficulty breathing, shortness, of breath, choking, or chest pain, department for reevaluation.

## 2017-08-07 NOTE — ED Provider Notes (Signed)
[facility] EMERGENCY DEPARTMENT Provider Note   CSN: [id number] Arrival date & time: 08/07/17  1005     History   Chief Complaint Chief Complaint  Patient presents with  . Atrial Fibrillation  . Chest Pain    HPI [name] is a 66 y.o. female of paroxysmal atrial fibrillation on chronic anticoagulation with Xarelto, asthma, HTN, GERD, and seasonal allergies who presents to the emergency department with a chief complaint of asthma exacerbation and A. fib with RVR that began after she used her her Advair inhaler this morning.  She reports her symptoms resolved prior to arrival. She was seen in the ED for the same yesterday after using a duo nebulizer treatment.  She was seen by her PCP on 08/05/2017 for shortness of breath, nonproductive cough, and intermittent sinus congestion without drainage and was discharged with a course of azithromycin, prednisone, Advair, and a refill of her albuterol inhaler.  Review of her medical record indicates that the patient was using her albuterol inhaler daily and nebulizer treatments for the two days prior to her PCP appointment.  She reports that since the appointment she has been taking the Advair, montelukast, but has not yet started the course of prednisone, and was told to stop taking albuterol as well as nebulizer treatments.  The patient's daughter reports that she has a follow-up appointment tomorrow morning with her cardiologist NP Kayleen Memos at the [facility].   The history is provided by the patient. No language interpreter was used.    Past Medical History:  Diagnosis Date  . A-fib ([place])   . Allergy    seasonal  . Asthma   . Chronic anticoagulation   . GERD (gastroesophageal reflux disease)   . Hypertension   . PAF (paroxysmal atrial fibrillation) ([place]) 11/26/2015   April/May 2017: Multiple ED visits for A. Fib.  02/12/16: Improved on diltiazem IV. Cardiology consulted in the ED and discharge patient with  diltiazem XR 120 mg once daily with 60 mg by mouth every 6 hours as needed for symptomatic palpitations, flecainide 50 mg twice daily. Referred to the atrial fibrillation clinic.  02/17/16: Seen in the atrial fibrillation clinic by NP Roderic Palau. Normal sin    Patient Active Problem List   Diagnosis Date Noted  . Allergic contact dermatitis 04/27/2017  . Osteoporosis without current pathological fracture 04/15/2017  . Constipation 10/19/2016  . Normocytic anemia 10/19/2016  . Hyperlipidemia 03/18/2016  . Special screening for malignant neoplasms, colon 03/17/2016  . Eczema 03/17/2016  . Cervical cancer screening 03/17/2016  . Assistance with transportation 03/17/2016  . Hypertension   . GERD (gastroesophageal reflux disease)   . PAF (paroxysmal atrial fibrillation) ([place]) 11/26/2015  . Chronic anticoagulation 11/26/2015  . Folliculitis 58/04/9832  . Marijuana abuse 06/11/2015  . Asthma, mild intermittent     Past Surgical History:  Procedure Laterality Date  . TONSILLECTOMY    . TUBAL LIGATION      OB History    No data available       Home Medications    Prior to Admission medications   Medication Sig Start Date End Date Taking? Authorizing Provider  albuterol (PROVENTIL HFA) 108 (90 Base) MCG/ACT inhaler Inhale 2 puffs into the lungs every 4 (four) hours as needed for wheezing or shortness of breath. 08/04/17  Yes [name], MD  albuterol (PROVENTIL) (2.5 MG/3ML) 0.083% nebulizer solution USE 3 ML VIA NEBULIZER EVERY 6 HOURS AS NEEDED FOR WHEEZING OR SHORTNESS OF BREATH 08/03/17  Yes [name], MD  azithromycin (ZITHROMAX Z-PAK) 250 MG tablet Take 2 tablets (500 mg) on  Day 1,  followed by 1 tablet (250 mg) once daily on Days 2 through 5. 08/05/17 08/10/17 Yes [name], MD  CARTIA XT 120 MG 24 hr capsule TAKE 1 CAPSULE(120 MG) BY MOUTH DAILY 12/17/16  Yes Sherran Needs, NP  diltiazem (CARDIZEM) 60 MG tablet TAKE 1 TABLET BY MOUTH EVERY 6 HOURS AS NEEDED  FOR RAPID AFIB HEART RATE OVER 100 06/10/17  Yes Sherran Needs, NP  flecainide (TAMBOCOR) 100 MG tablet Take 1 tablet (100 mg total) by mouth 2 (two) times daily. 04/27/17  Yes Lacroce, Hulen Shouts, MD  Fluticasone-Salmeterol (ADVAIR DISKUS) 250-50 MCG/DOSE AEPB Inhale 1 puff into the lungs 2 (two) times daily. 08/05/17 08/05/18 Yes [name], MD  hydrocortisone cream 1 % Apply 1 application topically as needed for itching.   Yes [provider]  losartan (COZAAR) 50 MG tablet Take 1 tablet (50 mg total) by mouth daily. 06/17/17  Yes [name], MD  montelukast (SINGULAIR) 10 MG tablet Take 1 tablet (10 mg total) by mouth daily. 08/05/17 08/05/18 Yes [name], MD  polyethylene glycol powder (GLYCOLAX/MIRALAX) powder Mix 17g of Miralax in 8 ounces of water every other day 07/20/17  Yes [name], MD  potassium chloride SA (K-DUR,KLOR-CON) 10 MEQ tablet Take 2 tablets (20 mEq total) by mouth daily. Patient taking differently: Take 10 mEq by mouth 2 (two) times daily.  11/12/16  Yes Sherran Needs, NP  predniSONE (STERAPRED UNI-PAK 48 TAB) 10 MG (48) TBPK tablet Take 4 tablets daily for 5 days, decrease it to 3 tablets for another 5 days, 2 tablets for 5 days, then take 1 tablet for another 5 days before stopping. 08/05/17  Yes [name], MD  ranitidine (ZANTAC) 150 MG tablet TAKE 1 TABLET BY MOUTH TWICE DAILY 03/16/17  Yes Axel Filler, MD  rivaroxaban (XARELTO) 20 MG TABS tablet Take 1 tablet (20 mg total) by mouth daily with supper. 03/15/17  Yes Sherran Needs, NP  tiotropium (SPIRIVA HANDIHALER) 18 MCG inhalation capsule INHALE THE CONTENTS OF 1 CAPSULE(18 MCG) VIA INHALATION DEVICE EVERY DAY 06/28/17  Yes [name], DO    Family History Family History  Problem Relation Age of Onset  . Heart attack Mother        [name] age  . Breast cancer Sister        Late 16s; now s/p mastectomy   . Lung cancer Brother        x 2  . Cancer Sister   . Cancer Sister    . Cancer Sister   . Colon cancer Neg Hx   . Colon polyps Neg Hx   . Esophageal cancer Neg Hx   . Rectal cancer Neg Hx   . Stomach cancer Neg Hx     Social History Social History   Tobacco Use  . Smoking status: Former Smoker    Types: Cigarettes    Last attempt to quit: 10/24/2015    Years since quitting: 1.7  . Smokeless tobacco: Never Used  Substance Use Topics  . Alcohol use: No  . Drug use: No    Comment: Smoked marijuana in the past.     Allergies   Protonix [pantoprazole sodium] and Celecoxib   Review of Systems Review of Systems  Constitutional: Negative for chills and fever.  HENT: Negative for congestion and facial swelling.   Respiratory: Positive for cough and  shortness of breath. Negative for choking and chest tightness.   Cardiovascular: Positive for palpitations. Negative for chest pain and leg swelling.  Musculoskeletal: Negative for back pain.  Neurological: Negative for headaches.     Physical Exam Updated Vital Signs BP (!) 137/92   Pulse 69   Temp 98.1 F (36.7 C) (Oral)   Resp 19   SpO2 99%   Physical Exam  Constitutional:  Non-toxic appearance. She does not appear ill. No distress.  HENT:  Head: Normocephalic.  Eyes: Conjunctivae are normal.  Neck: Neck supple.  Cardiovascular: Normal rate, regular rhythm, normal heart sounds and intact distal pulses. Exam reveals no gallop and no friction rub.  No murmur heard. Pulmonary/Chest: Effort normal and breath sounds normal. No stridor. No respiratory distress. She has no decreased breath sounds. She has no wheezes. She has no rhonchi. She has no rales. She exhibits no tenderness.  No conversational dyspnea.  Abdominal: Soft. She exhibits no distension.  Neurological: She is alert.  Skin: Skin is warm. Capillary refill takes less than 2 seconds. No ecchymosis and no rash noted. She is not diaphoretic. No cyanosis or erythema. No pallor.  Psychiatric: Her behavior is normal.  Nursing note and  vitals reviewed.   ED Treatments / Results  Labs (all labs ordered are listed, but only abnormal results are displayed) Labs Reviewed  CBC - Abnormal; Notable for the following components:      Result Value   WBC 3.6 (*)    All other components within normal limits  BASIC METABOLIC PANEL  I-STAT TROPONIN, ED    EKG  EKG Interpretation  Date/Time:  Sunday August 07 2017 10:09:33 EST Ventricular Rate:  81 PR Interval:  160 QRS Duration: 90 QT Interval:  410 QTC Calculation: 476 R Axis:   63 Text Interpretation:  Normal sinus rhythm Biatrial enlargement Abnormal ECG Confirmed by Malvin Johns [phone]) on 08/07/2017 11:02:19 AM       Radiology Dg Chest 2 View  Result Date: 08/07/2017 CLINICAL DATA:  Atrial fibrillation and chest pain EXAM: CHEST  2 VIEW COMPARISON:  08/06/2017 FINDINGS: Mild cardiomegaly. Hyperaeration. Mild bibasilar atelectasis improved. No pneumothorax. IMPRESSION: Improved bibasilar atelectasis. Electronically Signed   By: Marybelle Killings M.D.   On: 08/07/2017 11:11   Dg Chest 2 View  Result Date: 08/06/2017 CLINICAL DATA:  66 y/o female status post asthma attack this morning. Subsequent a fib. Reports chest congestion and nonproductive cough for 2 weeks. Chest tightness and shortness of breath. EXAM: CHEST  2 VIEW COMPARISON:  07/13/2017 and earlier. FINDINGS: Semi upright AP and lateral views of the chest. Stable lung volumes at the upper limits of normal to hyperinflated. Stable mild cardiomegaly. Other mediastinal contours are within normal limits. Visualized tracheal air column is within normal limits. No pneumothorax, pulmonary edema or pleural effusion. Linear atelectasis at both lung bases. No other confluent pulmonary opacity. No acute osseous abnormality identified. Negative visible bowel gas pattern. IMPRESSION: Mild atelectasis at both lung bases. No other acute cardiopulmonary abnormality. Electronically Signed   By: Genevie Ann M.D.   On: 08/06/2017  07:41    Procedures Procedures (including critical care time)  Medications Ordered in ED Medications - No data to display   Initial Impression / Assessment and Plan / ED Course  I have reviewed the triage vital signs and the nursing notes.  Pertinent labs & imaging results that were available during my care of the patient were reviewed by me and considered in my medical decision making (  see chart for details).     66 year old female with a history of paroxysmal atrial fibrillation on chronic anticoagulation with Xarelto, asthma, and seasonal allergies who presents to the emergency department with dyspnea and concern for atrial fibrillation with RVR.  EKG reviewed and no A. fib.  Patient is in sinus rhythm on the monitor.  SaO2 99% with good waveform.  The patient was ambulated through the department on pulse ox with good SaO2.  She was seen yesterday for similar symptoms after she used a an albuterol nebulizer treatment, and EMS reported A. fib with RVR on EKG in route, which resolved spontaneously.  In triage today, the patient was in normal sinus rhythm.  Today she reports that she used her Advair inhaler.  She is here today with her daughter, and they have a follow-up appointment with cardiology tomorrow morning.  Her asthma appears to be well controlled here.  X-ray is unremarkable.  Discussed with the patient that unless her symptoms get extremely severe overnight, to discontinue using her montelukast, Advair, nebulizer treatments, and not to start taking her course of prednisone until she is seen by cardiology tomorrow.  Of note, she has a 14 ED visits over the last 6 months for symptoms related to asthma exacerbation, eczema, and paroxysmal A. Fib.  Discussed the treatment plan with Dr. Tamera Punt, attending physician.  Strict return precautions are given.  Also discussed with the patient that her more frequent asthma exacerbations may be secondary to the leak in her roof, which has not been fixed  yet by her landlord.  We discussed that she should talk to her daughter to see if staying with her for a short period of time is an option to see if her asthma symptoms improve. VSS. NAD.  The patient is safe for discharge at this time.  This chart was dictated using voice recognition software. Despite best efforts to proofread, errors can occur which can change the documentation meaning.   Final Clinical Impressions(s) / ED Diagnoses   Final diagnoses:  Medication adverse effect, subsequent encounter  Moderate asthma with allergic rhinitis with acute exacerbation, unspecified whether persistent    ED Discharge Orders    None       Joanne Gavel, PA-C 08/07/17 1601    Malvin Johns, MD 08/07/17 1622

## 2017-08-07 NOTE — ED Triage Notes (Signed)
Pt seen here yesterday for afib/chest pain. Pt states "I took a medication this morning and it put me in afib and my heart was racing/speeding." Pt currently in a normal sinus rhythm at triage. Pt states some pain 2/10.

## 2017-08-08 ENCOUNTER — Ambulatory Visit (INDEPENDENT_AMBULATORY_CARE_PROVIDER_SITE_OTHER): Payer: Medicare Other | Admitting: Internal Medicine

## 2017-08-08 ENCOUNTER — Other Ambulatory Visit: Payer: Self-pay

## 2017-08-08 ENCOUNTER — Encounter (HOSPITAL_COMMUNITY): Payer: Self-pay | Admitting: Nurse Practitioner

## 2017-08-08 ENCOUNTER — Other Ambulatory Visit: Payer: Self-pay | Admitting: *Deleted

## 2017-08-08 ENCOUNTER — Telehealth: Payer: Self-pay

## 2017-08-08 ENCOUNTER — Encounter (HOSPITAL_COMMUNITY): Payer: Self-pay | Admitting: *Deleted

## 2017-08-08 ENCOUNTER — Emergency Department (HOSPITAL_COMMUNITY)
Admission: EM | Admit: 2017-08-08 | Discharge: 2017-08-08 | Disposition: A | Discharge status: Home / Self Care | Payer: Medicare Other | Attending: Emergency Medicine | Admitting: Emergency Medicine

## 2017-08-08 ENCOUNTER — Ambulatory Visit (HOSPITAL_BASED_OUTPATIENT_CLINIC_OR_DEPARTMENT_OTHER)
Admission: RE | Admit: 2017-08-08 | Discharge: 2017-08-08 | Disposition: A | Discharge status: Home / Self Care | Payer: Medicare Other | Source: Ambulatory Visit | Attending: Nurse Practitioner | Admitting: Nurse Practitioner

## 2017-08-08 VITALS — BP 148/84 | HR 70 | Ht 67.0 in | Wt 164.0 lb

## 2017-08-08 VITALS — BP 135/81 | HR 69 | Temp 98.0°F | Ht 67.0 in | Wt 164.7 lb

## 2017-08-08 DIAGNOSIS — R531 Weakness: Secondary | ICD-10-CM | POA: Diagnosis not present

## 2017-08-08 DIAGNOSIS — R0602 Shortness of breath: Secondary | ICD-10-CM | POA: Insufficient documentation

## 2017-08-08 DIAGNOSIS — J4520 Mild intermittent asthma, uncomplicated: Secondary | ICD-10-CM | POA: Diagnosis not present

## 2017-08-08 DIAGNOSIS — Z79899 Other long term (current) drug therapy: Secondary | ICD-10-CM | POA: Insufficient documentation

## 2017-08-08 DIAGNOSIS — I480 Paroxysmal atrial fibrillation: Secondary | ICD-10-CM

## 2017-08-08 DIAGNOSIS — R002 Palpitations: Secondary | ICD-10-CM | POA: Diagnosis present

## 2017-08-08 DIAGNOSIS — I10 Essential (primary) hypertension: Secondary | ICD-10-CM | POA: Insufficient documentation

## 2017-08-08 DIAGNOSIS — J45909 Unspecified asthma, uncomplicated: Secondary | ICD-10-CM

## 2017-08-08 DIAGNOSIS — Z7901 Long term (current) use of anticoagulants: Secondary | ICD-10-CM

## 2017-08-08 DIAGNOSIS — R000 Tachycardia, unspecified: Secondary | ICD-10-CM | POA: Diagnosis not present

## 2017-08-08 DIAGNOSIS — Z87891 Personal history of nicotine dependence: Secondary | ICD-10-CM | POA: Insufficient documentation

## 2017-08-08 DIAGNOSIS — K219 Gastro-esophageal reflux disease without esophagitis: Secondary | ICD-10-CM

## 2017-08-08 DIAGNOSIS — R5383 Other fatigue: Secondary | ICD-10-CM

## 2017-08-08 LAB — CBC
HEMATOCRIT: 40.8 % (ref 36.0–46.0)
Hemoglobin: 13.6 g/dL (ref 12.0–15.0)
MCH: 27.1 pg (ref 26.0–34.0)
MCHC: 33.3 g/dL (ref 30.0–36.0)
MCV: 81.4 fL (ref 78.0–100.0)
Platelets: 223 10*3/uL (ref 150–400)
RBC: 5.01 MIL/uL (ref 3.87–5.11)
RDW: 15.3 % (ref 11.5–15.5)
WBC: 4.5 10*3/uL (ref 4.0–10.5)

## 2017-08-08 LAB — BASIC METABOLIC PANEL
Anion gap: 9 (ref 5–15)
BUN: 12 mg/dL (ref 6–20)
CHLORIDE: 110 mmol/L (ref 101–111)
CO2: 20 mmol/L — AB (ref 22–32)
Calcium: 9.7 mg/dL (ref 8.9–10.3)
Creatinine, Ser: 0.81 mg/dL (ref 0.44–1.00)
GFR calc Af Amer: >60 mL/min (ref >60)
GFR calc non Af Amer: >60 mL/min (ref >60)
Glucose, Bld: 127 mg/dL — ABNORMAL HIGH (ref 65–99)
POTASSIUM: 3.7 mmol/L (ref 3.5–5.1)
SODIUM: 139 mmol/L (ref 135–145)

## 2017-08-08 LAB — TROPONIN I

## 2017-08-08 LAB — I-STAT TROPONIN, ED: Troponin i, poc: 0 ng/mL (ref 0.00–0.08)

## 2017-08-08 LAB — BRAIN NATRIURETIC PEPTIDE: B NATRIURETIC PEPTIDE 5: 43.2 pg/mL (ref 0.0–100.0)

## 2017-08-08 IMAGING — CT CT HEAD W/O CM
4 of 6 series · 17 of 47 positions shown, 19 images · non-contrast
Comparison: None.

CLINICAL DATA: Headache

EXAM:
CT HEAD WITHOUT CONTRAST
TECHNIQUE: Contiguous axial images were obtained from the base of the skull
through the vertex without intravenous contrast.

[Series 2: head without · axial · non-contrast · 0.41mm/px · z∈[-106,-6]mm · 5 of 30 slices shown, 7 images]
[im 5/30  brain]
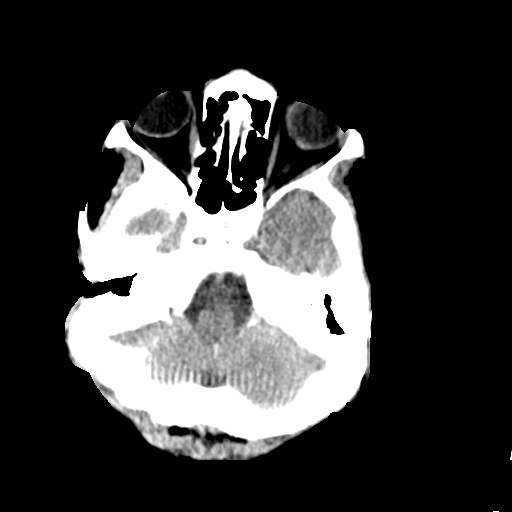
[im 5/30  bone]
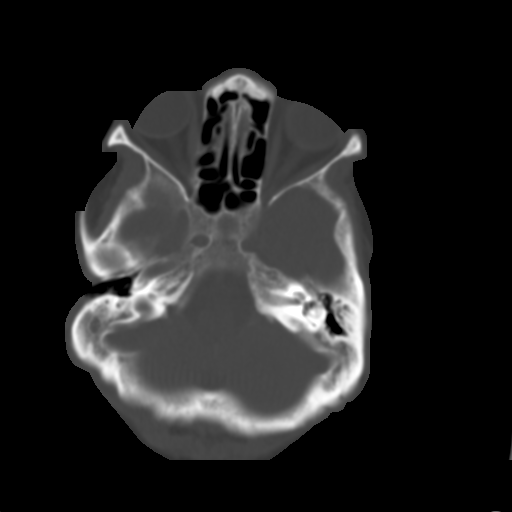
[im 10/30  brain]
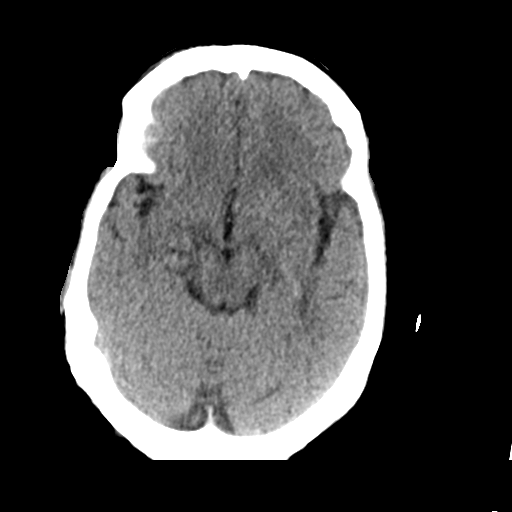
[im 15/30  brain]
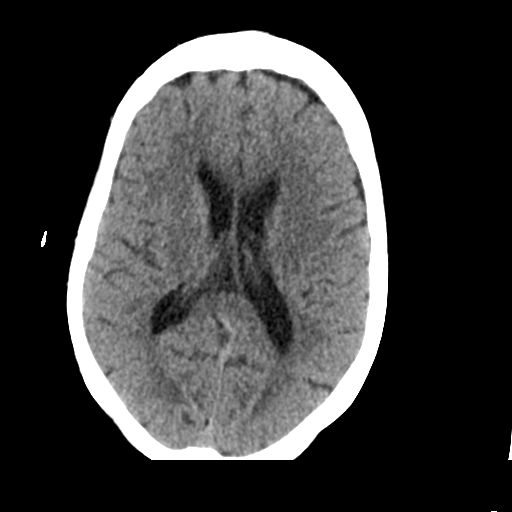
[im 20/30  brain]
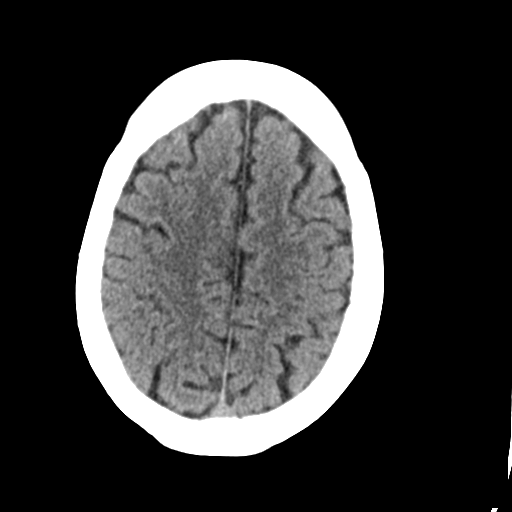
[im 25/30  brain]
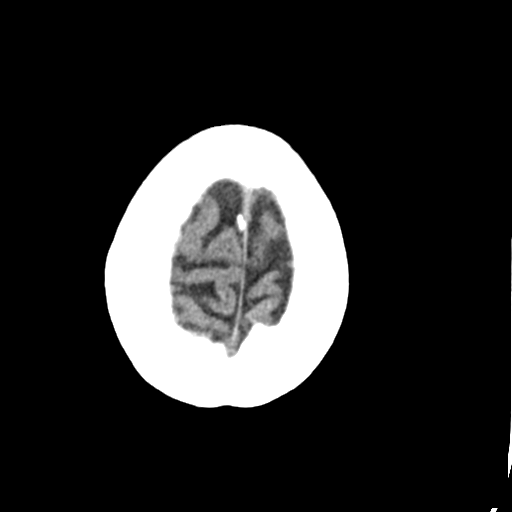
[im 25/30  bone]
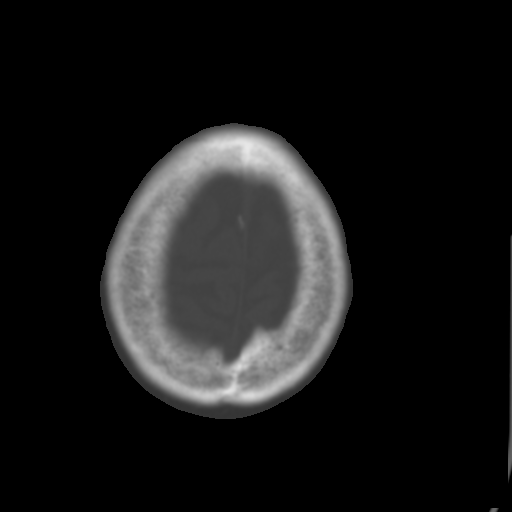

[Series 3: head bone · axial · 0.41mm/px · z∈[-118,-30]mm · 6 of 74 slices shown]
[im 5/74  bone]
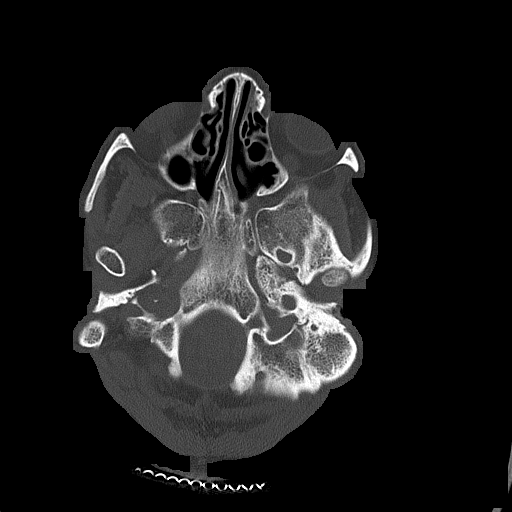
[im 15/74  bone]
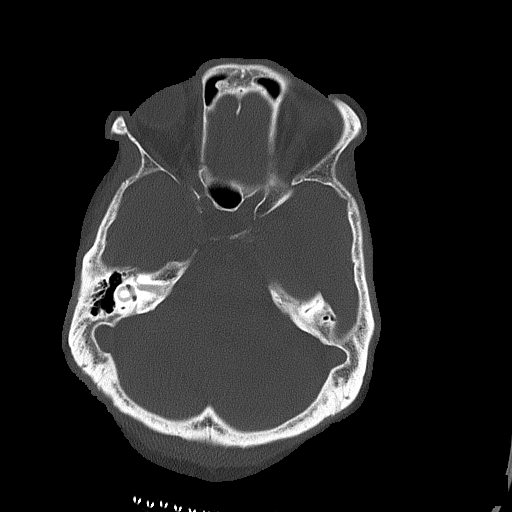
[im 25/74  bone]
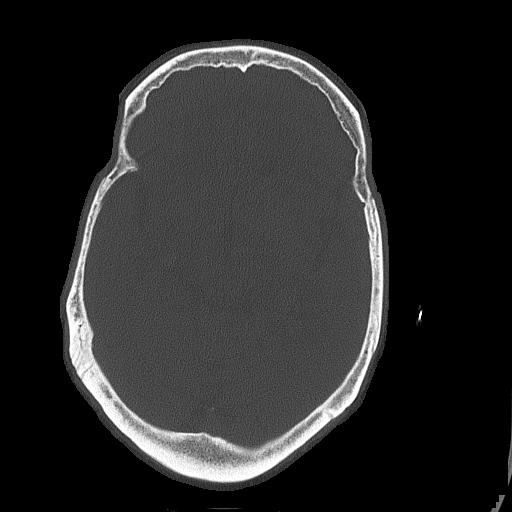
[im 35/74  bone]
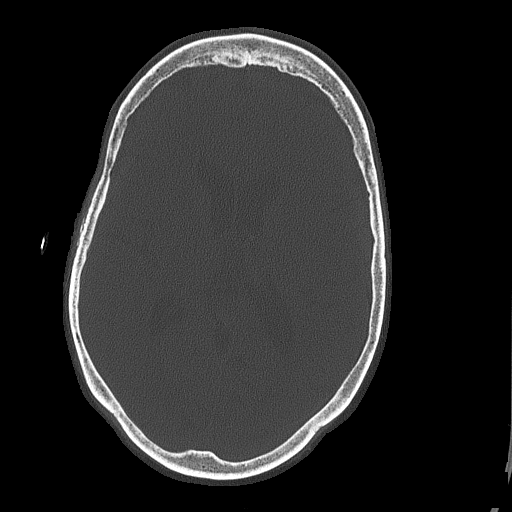
[im 39/74  bone]
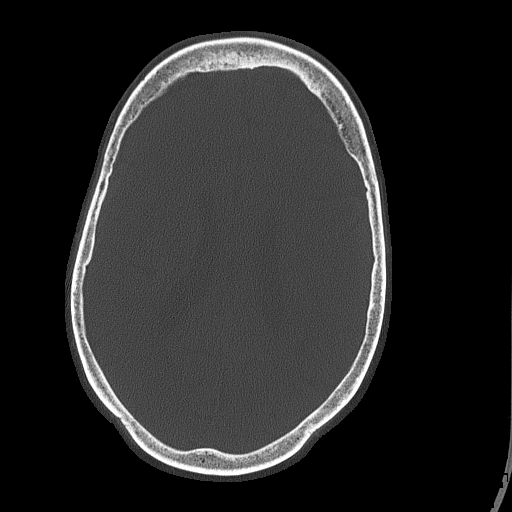
[im 49/74  bone]
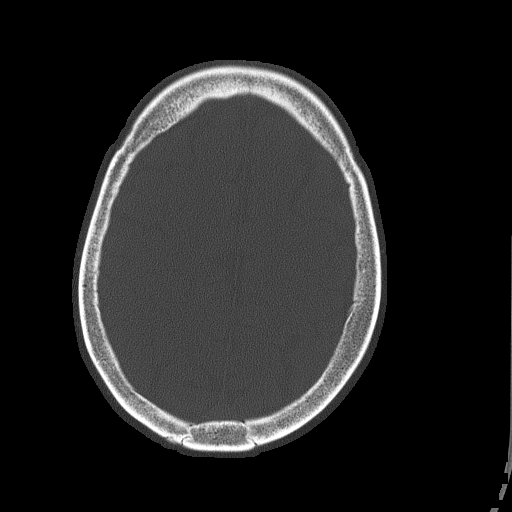

[Series 4: head without cor · coronal · non-contrast · 0.29mm/px · 3 of 67 slices shown]
[im 23/67  brain]
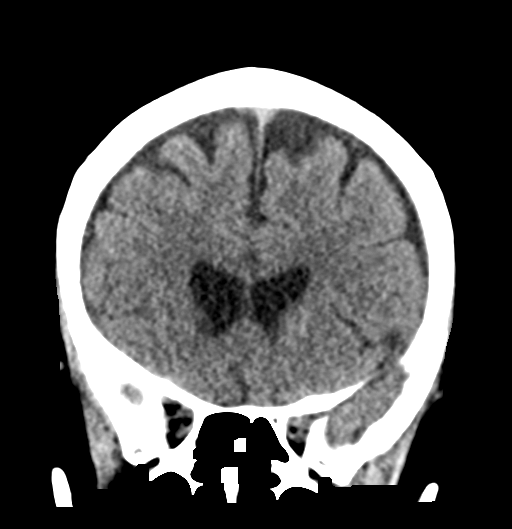
[im 30/67  brain]
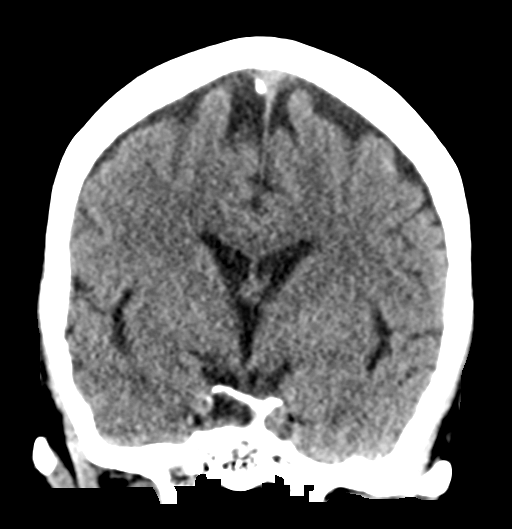
[im 37/67  brain]
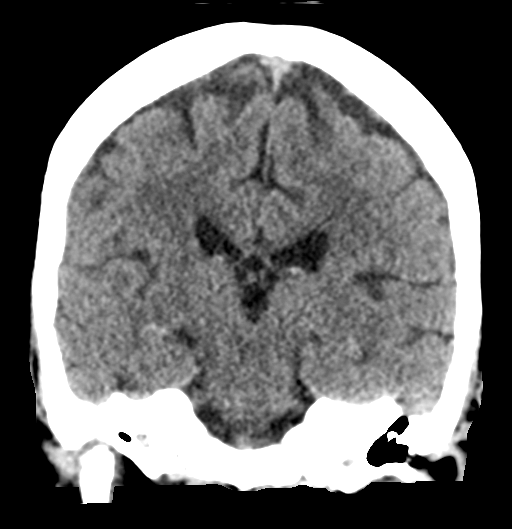

[Series 5: head without sag · sagittal · non-contrast · 0.29mm/px · 3 of 46 slices shown]
[im 16/46  brain]
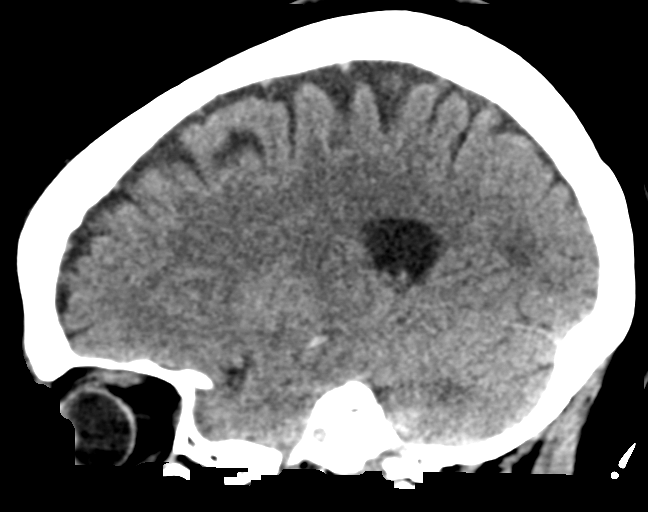
[im 23/46  brain]
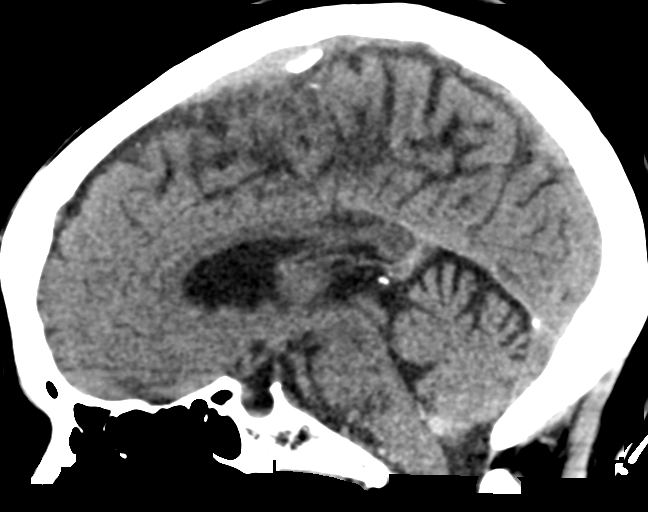
[im 31/46  brain]
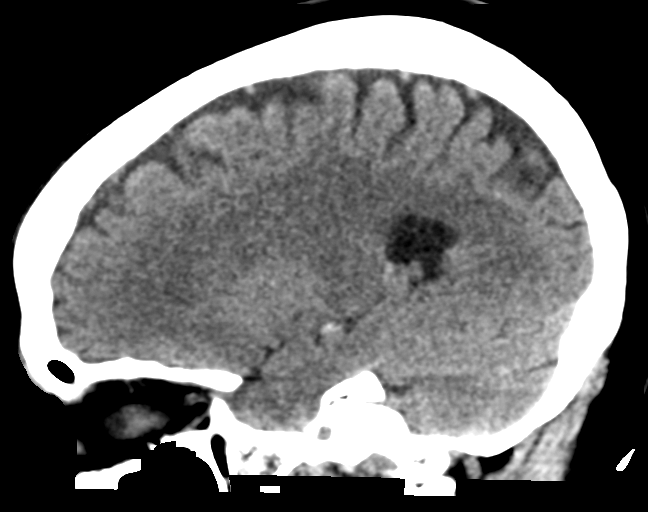

[17 of 47 positions shown; findings below may reference images not displayed]

FINDINGS: Brain: Ventricle size normal. Cerebral volume normal for age.
Negative for acute infarct. Negative for hemorrhage or mass. No
shift of the midline structures.

Vascular: No hyperdense vessel or unexpected calcification.

Skull: Negative

Sinuses/Orbits: Mild mucosal edema paranasal sinuses.  Normal orbit.

Other: Negative soft tissues.
IMPRESSION: No acute abnormality

## 2017-08-08 MED ORDER — ACETAMINOPHEN 325 MG PO TABS
650.0000 mg | ORAL_TABLET | Freq: Once | ORAL | Status: AC
Start: 2017-08-08 — End: 2017-08-08
  Administered 2017-08-08: 650 mg via ORAL
  Filled 2017-08-08: qty 2

## 2017-08-08 MED ORDER — DILTIAZEM HCL 60 MG PO TABS
60.0000 mg | ORAL_TABLET | Freq: Once | ORAL | Status: AC
  Administered 2017-08-08: 60 mg via ORAL
  Filled 2017-08-08: qty 1

## 2017-08-08 MED ORDER — FLUTICASONE PROPIONATE HFA 44 MCG/ACT IN AERO: 1.0000 | INHALATION_SPRAY | Freq: Two times a day (BID) | RESPIRATORY_TRACT | 12 refills | Status: DC

## 2017-08-08 NOTE — Patient Instructions (Signed)
It was good seeing you today.   Please stop taking all of your current asthma medications. I am starting a new inhaler called Flovent (Fluticasone). Please take this twice a day every day during the winter.   I am going to call your heart doctor to talk about the Singulair (Montelukast) as I do not believe this medicine is associated with Atrial Fibrillation. I will call you and let you know what she says.

## 2017-08-08 NOTE — Telephone Encounter (Signed)
Requesting to speak with a nurse about meds. Please call pt back.  

## 2017-08-08 NOTE — Telephone Encounter (Signed)
Rtc, no answer, lm for rtc 

## 2017-08-08 NOTE — Progress Notes (Signed)
Primary Care Physician: [name], MD Referring Physician: [facility] ER f/u   [name] is a 66 y.o. female with a h/o asthma, HTN, paroxysmal afib, maintaining SR for the most part on flecainide.She is being seen today in the afib clinic for multiple ER visits. She typically goes to the ER a lot for various reasons but was diagnosed with possible  pneumonia , 11/6 and has been in the ER 5 times since then, the last visit was early this am. The majority of the time, her asthma flares and she treats it with albuterol inhaler which triggers the afib. Usually the afib is replaced with SR in the  EMS unit or in the ER without additional meds or shock, and  this am was converted with  Po cardizem. She has 60 mg as needed cardizem and will take this, but then panics and goes to the ER within an hour vrs giving it several hours at home to work. She saw her PCP recently for breathing issues was given a z pack, prednisone and Advair, shortly after taking the drugs, she went into afib. ER told her to stop meds and she has f/u with PC  later today.  Today, she denies symptoms of palpitations, chest pain, shortness of breath, orthopnea, PND, lower extremity edema, dizziness, presyncope, syncope, or neurologic sequela. Positive for periorbital swelling.The patient is tolerating medications without difficulties and is otherwise without complaint today.   Past Medical History:  Diagnosis Date  . A-fib ([place])   . Allergy    seasonal  . Asthma   . Chronic anticoagulation   . GERD (gastroesophageal reflux disease)   . Hypertension   . PAF (paroxysmal atrial fibrillation) ([place]) 11/26/2015   April/May 2017: Multiple ED visits for A. Fib.  02/12/16: Improved on diltiazem IV. Cardiology consulted in the ED and discharge patient with diltiazem XR 120 mg once daily with 60 mg by mouth every 6 hours as needed for symptomatic palpitations, flecainide 50 mg twice daily. Referred to the atrial fibrillation clinic.   02/17/16: Seen in the atrial fibrillation clinic by NP Roderic Palau. Normal sin   Past Surgical History:  Procedure Laterality Date  . TONSILLECTOMY    . TUBAL LIGATION      Current Outpatient Medications  Medication Sig Dispense Refill  . acetaminophen (TYLENOL) 500 MG tablet Take 1,000 mg by mouth every 6 (six) hours as needed for headache (pain).    . CARTIA XT 120 MG 24 hr capsule TAKE 1 CAPSULE(120 MG) BY MOUTH DAILY (Patient taking differently: TAKE 1 CAPSULE(120 MG) BY MOUTH EVERY EVENING) 90 capsule 3  . diltiazem (CARDIZEM) 60 MG tablet TAKE 1 TABLET BY MOUTH EVERY 6 HOURS AS NEEDED FOR RAPID AFIB HEART RATE OVER 100 (Patient taking differently: TAKE 1 TABLET (60 MG) BY MOUTH EVERY 6 HOURS AS NEEDED FOR RAPID AFIB HEART RATE OVER 100) 45 tablet 2  . flecainide (TAMBOCOR) 100 MG tablet Take 1 tablet (100 mg total) by mouth 2 (two) times daily. 60 tablet 6  . losartan (COZAAR) 50 MG tablet Take 1 tablet (50 mg total) by mouth daily. 30 tablet 5  . polyethylene glycol powder (GLYCOLAX/MIRALAX) powder Mix 17g of Miralax in 8 ounces of water every other day (Patient taking differently: Take by mouth See admin instructions. Mix 17 g. in 8 oz water and drink daily as needed for constipation) 255 g 0  . potassium chloride SA (K-DUR,KLOR-CON) 10 MEQ tablet Take 2 tablets (20 mEq total) by mouth  daily. (Patient taking differently: Take 10 mEq by mouth 2 (two) times daily. ) 60 tablet 3  . ranitidine (ZANTAC) 150 MG tablet TAKE 1 TABLET BY MOUTH TWICE DAILY (Patient taking differently: TAKE 1 TABLET (150 MG) BY MOUTH TWICE DAILY AS NEEDED FOR INDIGESTION) 60 tablet 0  . rivaroxaban (XARELTO) 20 MG TABS tablet Take 1 tablet (20 mg total) by mouth daily with supper. 30 tablet 6  . fluticasone (FLOVENT HFA) 44 MCG/ACT inhaler Inhale 1 puff into the lungs 2 (two) times daily. 1 Inhaler 12   Current Facility-Administered Medications  Medication Dose Route Frequency Provider Last Rate Last Dose  . 0.9  %  sodium chloride infusion  500 mL Intravenous Continuous Armbruster, Carlota Raspberry, MD        Allergies  Allergen Reactions  . Protonix [Pantoprazole Sodium] Other (See Comments)    Heart races  . Celecoxib Rash    Social History   Socioeconomic History  . Marital status: Single    Spouse name: Not on file  . Number of children: Not on file  . Years of education: Not on file  . Highest education level: Not on file  Social Needs  . Financial resource strain: Not on file  . Food insecurity - worry: Not on file  . Food insecurity - inability: Not on file  . Transportation needs - medical: Not on file  . Transportation needs - non-medical: Not on file  Occupational History  . Occupation: Retired  Tobacco Use  . Smoking status: Former Smoker    Types: Cigarettes    Last attempt to quit: 10/24/2015    Years since quitting: 1.7  . Smokeless tobacco: Never Used  Substance and Sexual Activity  . Alcohol use: No  . Drug use: No    Comment: Smoked marijuana in the past.  . Sexual activity: Yes    Partners: Male    Birth control/protection: Post-menopausal  Other Topics Concern  . Not on file  Social History Narrative   Lives alone in [place], [place] and has 2 daughters who are within driving distance   Enjoys watching soap operas, reading, exercise    Family History  Problem Relation Age of Onset  . Heart attack Mother        [name] age  . Breast cancer Sister        Late 86s; now s/p mastectomy   . Lung cancer Brother        x 2  . Cancer Sister   . Cancer Sister   . Cancer Sister   . Colon cancer Neg Hx   . Colon polyps Neg Hx   . Esophageal cancer Neg Hx   . Rectal cancer Neg Hx   . Stomach cancer Neg Hx     ROS- All systems are reviewed and negative except as per the HPI above  Physical Exam: Vitals:   08/08/17 1352  BP: (!) 148/84  Pulse: 70  Weight: 164 lb (74.4 kg)  Height: 5\' 7"  (1.702 m)   Wt Readings from Last 3 Encounters:  08/08/17 164 lb 11.2 oz  (74.7 kg)  08/08/17 164 lb (74.4 kg)  08/06/17 165 lb (74.8 kg)    Labs: Lab Results  Component Value Date   NA 139 08/08/2017   K 3.7 08/08/2017   CL 110 08/08/2017   CO2 20 (L) 08/08/2017   GLUCOSE 127 (H) 08/08/2017   BUN 12 08/08/2017   CREATININE 0.81 08/08/2017   CALCIUM 9.7 08/08/2017   PHOS  3.0 04/15/2017   MG 2.1 09/25/2016   Lab Results  Component Value Date   INR 1.03 10/06/2016   Lab Results  Component Value Date   CHOL 245 (H) 03/17/2016   HDL 94 03/17/2016   LDLCALC 132 (H) 03/17/2016   TRIG 95 03/17/2016     GEN- The patient is well appearing, alert and oriented x 3 today.   Head- normocephalic, atraumatic Eyes-  Sclera clear, conjunctiva pink Ears- hearing intact Oropharynx- clear Neck- supple, no JVP Lymph- no cervical lymphadenopathy Lungs- Clear to ausculation bilaterally, normal work of breathing Heart- Regular rate and rhythm, no murmurs, rubs or gallops, PMI not laterally displaced GI- soft, NT, ND, + BS Extremities- no clubbing, cyanosis, or edema MS- no significant deformity or atrophy Skin- no rash or lesion Psych- euthymic mood, full affect Neuro- strength and sensation are intact  EKG-NSR at 70 bpm, pr itn 168 ms, qrs int 90 ms, qtc 455 ms Epic records reviewed   Assessment and Plan: 1. Paroxysmal Afib In ER for multiple visits, usually triggered by treatment for asthma Sees PCP this afternoon and would be helpful to minimize any treatment for asthma that exacerbates afib, if possible Re education how to take prn cardizem and to give it several hours to work and if we are in the office to call here first for advise instead of going to the ER Discussed afib ablation before but was not interested, now would like to discuss and will refer to discuss with Dr. Rayann Heman Continue xarelto for chadsvasc score of at least 3 Will need to update echo prior to seeing Dr. Lawrence Marseilles C. Destan Franchini, [facility] [address] [place], [place] [address] [phone]

## 2017-08-08 NOTE — Progress Notes (Signed)
CC: follow-up of ED visit for AFib  HPI:  [name] is a 66 y.o. F with hx of PAF (on Xarelto, cartia xt 120mg  and dilt prn) and asthma here for follow-up of multiple ED visits over the past several days. She has been seen several times in the ED with PAF following treatment of her recent asthma exacerbation.   Initially seen in clinic here 12/14 w/ asthma exacerbation & prescribed a Z-pack, advair, singulair and prn albuterol. She was seen in the ED the following morning after she woke up overnight SOB/wheezing, used a duoneb and subsequently felt that she went into atrial fibrillation. She took dilt prn without resolution and called EMS. She was given another duoneb by EMS enroute and arrived to ED in AFib w/RVR with rates in the 180. She converted spontaneously to NSR and was instructed to hold further duoneb treatments.  She was again seen the following day in the ED as she felt that she went into AFib with RVR after taking her Advair inhaler. This resolved spontaneously prior to arriving to the ED. Instructed to discontinue this medicine and discharged home.   She was seen again late that night after waking up with a headache and was found to be again in AFib with RVR. She was advised to discontinue prednisone, singulair, advair, azithromax and albuterol. She followed up at her AFib clinic several hours later who advised continuing to hold these medications and discussing treatment options with PCP.   She is a poor historian however it seems her asthma has only started bothering her since it snowed a few weeks ago and her asthma seems to only worsen during the winter months. Despite that, she takes albuterol twice daily even without wheezing or SOB, as it increases her energy. She tells me she was previously on an oral medication for asthma (stated Theodore-300 which seems most likely to be Theophylline) however hasn't been on this in many years. Today she complains of fatigue attributed  to not taking her albuterol and she is worried about going into atrial fibrillation so often.    Past Medical History:  Diagnosis Date  . A-fib ([place])   . Allergy    seasonal  . Asthma   . Chronic anticoagulation   . GERD (gastroesophageal reflux disease)   . Hypertension   . PAF (paroxysmal atrial fibrillation) ([place]) 11/26/2015   April/May 2017: Multiple ED visits for A. Fib.  02/12/16: Improved on diltiazem IV. Cardiology consulted in the ED and discharge patient with diltiazem XR 120 mg once daily with 60 mg by mouth every 6 hours as needed for symptomatic palpitations, flecainide 50 mg twice daily. Referred to the atrial fibrillation clinic.  02/17/16: Seen in the atrial fibrillation clinic by NP Roderic Palau. Normal sin   Review of Systems:   General: +Fatigue. Denies fevers, chills HEENT: Denies changes in vision, sore throat Cardiac: Denied current CP, SOB or palpitations however admitted to SOB when climbing stairs.  Pulmonary: Denies current cough, wheezes Abd: Denies diarrhea, constipation Extremities: Denies weakness or swelling  Physical Exam: General: Alert, in no acute distress. Pleasant and conversant. Poor health literacy and poor historian. HEENT: No icterus, injection or ptosis. No hoarseness or dysarthria  Cardiac: RRR, no MGR appreciated. She asked me to check again during exam when she thought she was in AFib. She had RRR at that time as well.  Pulmonary: CTA BL with normal WOB on RA. Able to speak in complete sentences. No wheezes. Abd: Soft,  non-tender. +bs Extremities: Warm, perfused. No significant pedal edema.  Vitals:   08/08/17 1452  BP: 135/81  Pulse: 69  Temp: 98 F (36.7 C)  TempSrc: Oral  SpO2: 100%  Weight: 164 lb 11.2 oz (74.7 kg)  Height: 5\' 7"  (1.702 m)   Assessment & Plan:   See Encounters Tab for problem based charting.  Patient seen with Dr. Lynnae January

## 2017-08-08 NOTE — ED Triage Notes (Signed)
The pt woke up with  A headache approx 30 minutes ago with a headache  She has a history of af and she always has a headache with  That.  She was just in the af clinic yesteerday for the same

## 2017-08-08 NOTE — Progress Notes (Signed)
Medicine attending: Medical history, presenting problems, physical findings, and medications, reviewed with resident physician Dr Summaya Amin on the day of the patient visit and I concur with her evaluation and management plan. 

## 2017-08-08 NOTE — Patient Outreach (Addendum)
[facility] [facility]) Care Management  08/08/2017  ANAIH BRANDER 1951-07-03 [id number]  Referral Date: 08/08/2017 Referral Source: [facility] ED Census Referral Reason: HIGH ED UTILIZATION Insurance:  [employer] telephone call to patient.  [employer] compliance verified. Per patient she has been to the ED x 3 this weekend for Atrial Fib. Per patient she can tell when she is going into Afib. Patient stated she has went into a fib several times after she took the medication that was prescribed for her asthma. .  Per patient the Dr had mentioned  A procedure for the Afib. Patient wanted to know does she need a second opinion, Per patient she is having a lot of problems with her asthma.  Patient stated that she has transportation to the Dr office through [employer].  Per patient she is taking her medications as per ordered and no problems affording them. Patient stated that she doesn't need a a Education officer, museum at this time. Patient has agreed to being referred to pharmacy and receiving calls from telephonic.  Plane. Refer to pharmacy Refer to care coordination  [place] Management [phone]

## 2017-08-08 NOTE — Discharge Instructions (Addendum)
Stay off of the antibiotics and prednisone for now as they seem to be contributing to your atrial fibrillation.  Follow-up with the A. fib clinic tomorrow.  Return to the ED if develop chest pain, shortness of breath or other concerns.

## 2017-08-08 NOTE — ED Provider Notes (Addendum)
[name] EMERGENCY DEPARTMENT Provider Note   CSN: [id number] Arrival date & time: 08/08/17  0047     History   Chief Complaint Chief Complaint  Patient presents with  . Tachycardia    HPI [name] is a 66 y.o. female.  Patient with history of atrial fibrillation on Xarelto, asthma, hypertension presenting with palpitations and rapid heartbeat for the past 2 hours.  She believes she is in A. fib again.  She has had 2 recent visits for the same.  She was recently prescribed Zithromax, prednisone and Advair for URI infection which she believes are triggering her A. fib.  She took her Cardizem at home about 10:30 PM which she takes as needed for palpitations.  Last 2 times she was here she converted without any medication or electricity.  She denies any chest pain.  She denies any shortness of breath, dizziness, cough or fever.  No leg pain or leg swelling.  She has not used any of her URI medication since she was seen a few days ago.   The history is provided by the patient.    Past Medical History:  Diagnosis Date  . A-fib ([place])   . Allergy    seasonal  . Asthma   . Chronic anticoagulation   . GERD (gastroesophageal reflux disease)   . Hypertension   . PAF (paroxysmal atrial fibrillation) ([place]) 11/26/2015   April/May 2017: Multiple ED visits for A. Fib.  02/12/16: Improved on diltiazem IV. Cardiology consulted in the ED and discharge patient with diltiazem XR 120 mg once daily with 60 mg by mouth every 6 hours as needed for symptomatic palpitations, flecainide 50 mg twice daily. Referred to the atrial fibrillation clinic.  02/17/16: Seen in the atrial fibrillation clinic by NP Roderic Palau. Normal sin    Patient Active Problem List   Diagnosis Date Noted  . Allergic contact dermatitis 04/27/2017  . Osteoporosis without current pathological fracture 04/15/2017  . Constipation 10/19/2016  . Normocytic anemia 10/19/2016  . Hyperlipidemia 03/18/2016  .  Special screening for malignant neoplasms, colon 03/17/2016  . Eczema 03/17/2016  . Cervical cancer screening 03/17/2016  . Assistance with transportation 03/17/2016  . Hypertension   . GERD (gastroesophageal reflux disease)   . PAF (paroxysmal atrial fibrillation) ([place]) 11/26/2015  . Chronic anticoagulation 11/26/2015  . Folliculitis 92/42/6834  . Marijuana abuse 06/11/2015  . Asthma, mild intermittent     Past Surgical History:  Procedure Laterality Date  . TONSILLECTOMY    . TUBAL LIGATION      OB History    No data available       Home Medications    Prior to Admission medications   Medication Sig Start Date End Date Taking? Authorizing Provider  albuterol (PROVENTIL HFA) 108 (90 Base) MCG/ACT inhaler Inhale 2 puffs into the lungs every 4 (four) hours as needed for wheezing or shortness of breath. 08/04/17   [name], MD  albuterol (PROVENTIL) (2.5 MG/3ML) 0.083% nebulizer solution USE 3 ML VIA NEBULIZER EVERY 6 HOURS AS NEEDED FOR WHEEZING OR SHORTNESS OF BREATH 08/03/17   [name], MD  azithromycin (ZITHROMAX Z-PAK) 250 MG tablet Take 2 tablets (500 mg) on  Day 1,  followed by 1 tablet (250 mg) once daily on Days 2 through 5. 08/05/17 08/10/17  [name], MD  CARTIA XT 120 MG 24 hr capsule TAKE 1 CAPSULE(120 MG) BY MOUTH DAILY 12/17/16   Sherran Needs, NP  diltiazem (CARDIZEM) 60 MG tablet TAKE  1 TABLET BY MOUTH EVERY 6 HOURS AS NEEDED FOR RAPID AFIB HEART RATE OVER 100 06/10/17   Sherran Needs, NP  flecainide (TAMBOCOR) 100 MG tablet Take 1 tablet (100 mg total) by mouth 2 (two) times daily. 04/27/17   Lacroce, Hulen Shouts, MD  Fluticasone-Salmeterol (ADVAIR DISKUS) 250-50 MCG/DOSE AEPB Inhale 1 puff into the lungs 2 (two) times daily. 08/05/17 08/05/18  [name], MD  hydrocortisone cream 1 % Apply 1 application topically as needed for itching.    [provider]  losartan (COZAAR) 50 MG tablet Take 1 tablet (50 mg total) by mouth daily.  06/17/17   [name], MD  montelukast (SINGULAIR) 10 MG tablet Take 1 tablet (10 mg total) by mouth daily. 08/05/17 08/05/18  [name], MD  polyethylene glycol powder (GLYCOLAX/MIRALAX) powder Mix 17g of Miralax in 8 ounces of water every other day 07/20/17   [name], MD  potassium chloride SA (K-DUR,KLOR-CON) 10 MEQ tablet Take 2 tablets (20 mEq total) by mouth daily. Patient taking differently: Take 10 mEq by mouth 2 (two) times daily.  11/12/16   Sherran Needs, NP  predniSONE (STERAPRED UNI-PAK 48 TAB) 10 MG (48) TBPK tablet Take 4 tablets daily for 5 days, decrease it to 3 tablets for another 5 days, 2 tablets for 5 days, then take 1 tablet for another 5 days before stopping. 08/05/17   [name], MD  ranitidine (ZANTAC) 150 MG tablet TAKE 1 TABLET BY MOUTH TWICE DAILY 03/16/17   Axel Filler, MD  rivaroxaban (XARELTO) 20 MG TABS tablet Take 1 tablet (20 mg total) by mouth daily with supper. 03/15/17   Sherran Needs, NP  tiotropium (SPIRIVA HANDIHALER) 18 MCG inhalation capsule INHALE THE CONTENTS OF 1 CAPSULE(18 MCG) VIA INHALATION DEVICE EVERY DAY 06/28/17   [name], DO    Family History Family History  Problem Relation Age of Onset  . Heart attack Mother        [name] age  . Breast cancer Sister        Late 20s; now s/p mastectomy   . Lung cancer Brother        x 2  . Cancer Sister   . Cancer Sister   . Cancer Sister   . Colon cancer Neg Hx   . Colon polyps Neg Hx   . Esophageal cancer Neg Hx   . Rectal cancer Neg Hx   . Stomach cancer Neg Hx     Social History Social History   Tobacco Use  . Smoking status: Former Smoker    Types: Cigarettes    Last attempt to quit: 10/24/2015    Years since quitting: 1.7  . Smokeless tobacco: Never Used  Substance Use Topics  . Alcohol use: No  . Drug use: No    Comment: Smoked marijuana in the past.     Allergies   Protonix [pantoprazole sodium] and Celecoxib   Review of Systems Review of  Systems  Constitutional: Negative for activity change, appetite change and diaphoresis.  HENT: Negative for congestion and rhinorrhea.   Respiratory: Positive for shortness of breath.   Cardiovascular: Positive for palpitations. Negative for chest pain.  Gastrointestinal: Negative for abdominal pain, nausea and vomiting.  Genitourinary: Negative for dysuria, hematuria, vaginal bleeding and vaginal discharge.  Musculoskeletal: Negative for arthralgias and myalgias.  Skin: Negative for rash.  Neurological: Positive for weakness. Negative for dizziness and headaches.    all other systems are negative except as noted in the HPI  and PMH.    Physical Exam Updated Vital Signs BP (!) 129/107   Pulse (!) 122   Temp 98.9 F (37.2 C) (Oral)   Resp 18   SpO2 94%   Physical Exam  Constitutional: She is oriented to person, place, and time. She appears well-developed and well-nourished. No distress.  HENT:  Head: Normocephalic and atraumatic.  Mouth/Throat: Oropharynx is clear and moist. No oropharyngeal exudate.  Eyes: Conjunctivae and EOM are normal. Pupils are equal, round, and reactive to light.  Neck: Normal range of motion. Neck supple.  No meningismus.  Cardiovascular: Normal rate, normal heart sounds and intact distal pulses.  No murmur heard. Irregular tachycardia  Pulmonary/Chest: Effort normal and breath sounds normal. No respiratory distress. She has no rales. She exhibits no tenderness.  Abdominal: Soft. There is no tenderness. There is no rebound and no guarding.  Musculoskeletal: Normal range of motion. She exhibits no edema or tenderness.  Neurological: She is alert and oriented to person, place, and time. No cranial nerve deficit. She exhibits normal muscle tone. Coordination normal.   5/5 strength throughout. CN 2-12 intact.Equal grip strength.   Skin: Skin is warm.  Psychiatric: She has a normal mood and affect. Her behavior is normal.  Nursing note and vitals  reviewed.    ED Treatments / Results  Labs (all labs ordered are listed, but only abnormal results are displayed) Labs Reviewed  BASIC METABOLIC PANEL - Abnormal; Notable for the following components:      Result Value   CO2 20 (*)    Glucose, Bld 127 (*)    All other components within normal limits  CBC  BRAIN NATRIURETIC PEPTIDE  TROPONIN I  I-STAT TROPONIN, ED    EKG  EKG Interpretation  Date/Time:  Monday August 08 2017 03:43:41 EST Ventricular Rate:  64 PR Interval:    QRS Duration: 98 QT Interval:  439 QTC Calculation: 453 R Axis:   55 Text Interpretation:  Sinus rhythm Right atrial enlargement now sinus ST changes resolved Confirmed by Ezequiel Essex [phone]) on 08/08/2017 3:58:06 AM Also confirmed by Ezequiel Essex [phone]), editor Hattie Perch [phone])  on 08/08/2017 6:56:15 AM       Radiology Dg Chest 2 View  Result Date: 08/07/2017 CLINICAL DATA:  Atrial fibrillation and chest pain EXAM: CHEST  2 VIEW COMPARISON:  08/06/2017 FINDINGS: Mild cardiomegaly. Hyperaeration. Mild bibasilar atelectasis improved. No pneumothorax. IMPRESSION: Improved bibasilar atelectasis. Electronically Signed   By: Marybelle Killings M.D.   On: 08/07/2017 11:11    Procedures Procedures (including critical care time)  Medications Ordered in ED Medications  diltiazem (CARDIZEM) tablet 60 mg (not administered)     Initial Impression / Assessment and Plan / ED Course  I have reviewed the triage vital signs and the nursing notes.  Pertinent labs & imaging results that were available during my care of the patient were reviewed by me and considered in my medical decision making (see chart for details).    Patient with recurrent atrial fibrillation in setting of recent URI illness.  Denies chest pain or shortness of breath.  A. fib with RVR on arrival.  Discussed options with patient.  She does not want electrocardioversion at this time.  Agrees to proceed with attempted p.o.  cardioversion since she had success in the past.  Patient converted with p.o. Cardizem to sinus rhythm in the 60s.  She denies any chest pain, shortness of breath or dizziness. Labs reassuring. Symptoms resolved now that back in sinus.  Advised  patient not to take prednisone, advair, zithromax as they seem to be triggering Afib.  Has followup with Afib clinic today. Return to the ED with chest pain, shortness of breath, or any other concerns.  CHA2DS2/VAS Stroke Risk Points      3 >= 2 Points: High Risk  1 - 1.99 Points: Medium Risk  0 Points: Low Risk    The patient's score has not changed in the past year.:  No Change     Details    This score determines the patient's risk of having a stroke if the  patient has atrial fibrillation.       Points Metrics  0 Has Congestive Heart Failure:  No   0 Has Vascular Disease:  No   1 Has Hypertension:  Yes   1 Age:  47   0 Has Diabetes:  No   0 Had Stroke:  No  Had TIA:  No  Had thromboembolism:  No   1 Female:  Yes           Final Clinical Impressions(s) / ED Diagnoses   Final diagnoses:  Paroxysmal atrial fibrillation [facility])    ED Discharge Orders    None       Ezequiel Essex, MD 08/08/17 2956    Ezequiel Essex, MD 08/08/17 [phone]

## 2017-08-09 ENCOUNTER — Encounter: Payer: Self-pay | Admitting: Internal Medicine

## 2017-08-09 ENCOUNTER — Other Ambulatory Visit (HOSPITAL_COMMUNITY): Payer: Self-pay | Admitting: *Deleted

## 2017-08-09 ENCOUNTER — Telehealth: Payer: Self-pay | Admitting: Internal Medicine

## 2017-08-09 DIAGNOSIS — I480 Paroxysmal atrial fibrillation: Secondary | ICD-10-CM

## 2017-08-09 NOTE — Assessment & Plan Note (Addendum)
As detailed in HPI, she's presented frequently over the past few days with AFib with RVR following several different treatments for her exacerbation. She was subsequently told to discontinue all of these medications by several providers.   Ms. Lambing reports that her wheezing is much improved. Her lungs are clear on examination and she is speaking to me in complete sentences. She was able to ambulate without SOB. I suspect her exacerbation has resolved at this point.   She has a poor health literacy and had difficulty describing outlining her treatment history and symptoms over the past several years. I was able to gather that her asthma usually doesn't bother her until winter-time. Despite her lack of wheezing or SOB throughout the remainder of the year, she continued to use albuterol twice daily as she felt it increased her energy. She mentions she was on an oral medication for asthma many many years ago which she called "Hubbard Robinson 300" and said Theophylline sounded familiar.   I think at this point she should be on maintenance therapy throughout the winter. I am continuing her inhaled ICS without BA and have counseled her on the appropriate use of her albuterol rescue inhaler (however to use sparingly). I think she would benefit from PO Singulair and have reached out to her AFib provider re: its association. She seems to have been on Theophylline in the past however this is likely to be associated with AFib recurrence.  -Discontinue Advair -Prescribed Fluticasone inhaler to be used twice daily, every day, during the winter -[place] on appropriate indication and use of albuterol, and advised to abstain unless needed -Patient has PCP appointment in 1 month which will be a good time to follow-up her response to this new regimen

## 2017-08-09 NOTE — Telephone Encounter (Signed)
Called pt, went over all her meds, dr molt's message and directions also f/u appt w/ dr Reesa Chew in 1 month. She verbalized back to me and states she will continue to follow dr molt's instructions

## 2017-08-09 NOTE — Assessment & Plan Note (Addendum)
She was in NSR throughout our examination, even while auscultating when she thought she was in AFib with RVR. She is still compliant with Xarelto, Cartia and prn dilt and just left her AFib clinic appointment where she was instructed to discontinue all asthma medications and speak with [name] regarding treatment options that wont trigger AFib.  Certainly albuterol, PO prednisone and likely advair could trigger PAF exacerbations however I doubt an ICS, Singulair or Azithromax are culprits after doing some research. I discussed this with Ms. Irion today who wants me to reach out to her AFib provider prior to her restarting Singulair. I have reached out to her provider to coordinate a treatment plan as I would like to start [name] on a PO medication, namely Singulair, who agree that Singulair is likely not contributing -Continue treatment plan per AFib clinic -Appreciate care coordination with her AFib provider!

## 2017-08-09 NOTE — Telephone Encounter (Signed)
Patient is calling about the side effects for the inhaler the Dr. Danford Bad gave, pls call patient

## 2017-08-09 NOTE — Progress Notes (Signed)
Internal Medicine Clinic Attending  I saw and evaluated the patient.  I personally confirmed the key portions of the history and exam documented by Dr. Molt and I reviewed pertinent patient test results.  The assessment, diagnosis, and plan were formulated together and I agree with the documentation in the resident's note. 

## 2017-08-10 NOTE — Telephone Encounter (Signed)
Spoke w/ pt yesterday please see note 12/18

## 2017-08-11 ENCOUNTER — Other Ambulatory Visit: Payer: Self-pay | Admitting: *Deleted

## 2017-08-11 NOTE — Patient Outreach (Signed)
[facility]) Care Management  08/11/2017  [name] 1951-04-16 [id number]  Telephone Screen  Referral Date: 08/10/17 Referral Source: [place] [facility]) Referral Reason: High ED Utilizer Insurance: [facility] Medicare   Outreach attempt # 1 to patient. HIPAA verified with patient. Patient reported, she utilizes the emergency department related to panic attacks. According to the patient, her heart goes into Atrial Fibrillation, which causes her to panic. She had multiple emergency room visits in 2018. Patient had a primary MD appointment on 08/08/17 and her Albuterol inhalers were discontinued. Patient stated, she had 4 different inhalers. Patient was prescribed Flovent, which she stated has helped reduce her rapid heartbeat. Patient stated, she use Flonase today and it triggered her Atrial Fibrillation. She stated, instead of taking an extra dose of Cardizem as prescribed. She took and extra dose of Flecainide to control her heart rate. Patient stated, her heart beat slowed down after she took the extra dose. Patient asked, if she should take her regular scheduled dose of Flecainide in the evening. RN CM educated patient on the side effects of Flecainide. Patient reported, she doesn't plan to use the Flonase again. Patient reported, she has noticed wheezing during the night. She is prescribed Singular (Montelukast), which she believes makes her "sleepy". RN CM encouraged patient to take the medication in the evening, before going to bed. Patient stated, she has difficulty managing her medical conditions. She wants to get better, per patient. [facility] services and benefits explained to patient. She agreed to services.    Plan: RN CM advised patient to contact RNCM for any needs or concerns. RN CM advised patient to alert MD for any changes in conditions.  RN CM will send referral to [facility] RN for further in home eval/assessment of care needs and management of chronic  conditions.  Shelby Bells, RN, BSN, MHA/MSL, [place] Telephonic Care Manager Coordinator [facility] Direct Phone: [phone] Toll Free: [phone] Fax: [phone]

## 2017-08-12 ENCOUNTER — Telehealth: Payer: Self-pay | Admitting: Internal Medicine

## 2017-08-12 NOTE — Telephone Encounter (Signed)
Pt states new inhaler causes shortness of breath; at first it was ok but when she used it today, "I couldn't catch my breath". Also states it stops her from going into afib, but affecting her breathing.

## 2017-08-12 NOTE — Telephone Encounter (Signed)
Just rec'd call from [facility] case manager juanita, called to go over dr molt's message again and reassure pt, no answer, lm for rtc

## 2017-08-12 NOTE — Telephone Encounter (Signed)
Called pt - no answer; left message (since we'll be closing soon) - to r=try inhaler again and "patient to take slow deep breaths and sit afterwards" per Dr Danford Bad. And to call the on-call doctor if needed .

## 2017-08-12 NOTE — Telephone Encounter (Signed)
Patient would like someone to call her back regarding her inhaler

## 2017-08-18 ENCOUNTER — Telehealth: Payer: Self-pay

## 2017-08-18 ENCOUNTER — Other Ambulatory Visit: Payer: Self-pay | Admitting: Pharmacist

## 2017-08-18 NOTE — Telephone Encounter (Signed)
Called pt, reassured her that she needs to use inhaler for appr 14 days to 21 days to achieve the level of desired effect, she is agreeable. Offered an appt, she refused at this time

## 2017-08-18 NOTE — Telephone Encounter (Signed)
Needs to speak with a nurse about inhaler. Please call back.

## 2017-08-19 NOTE — Patient Outreach (Signed)
[facility]) Care Management  [place]   08/19/2017  MILY MALECKI 01/18/1951 [id number]  Late entry for 08/18/17.    Subjective:  Patient was referred to [place] Pharmacist by [facility] RN Joaquim Lai for medication questions.   Noted per review of chart, patient with frequent ED visits due to atrial fibrillation and has had numerous medication changes to her pulmonary related agents as it was thought her frequent use of albuterol was contributing to atrial fibrillation.    Patient has a past medical history significant for:  Atrial fibrillation, on oral anti-coagulation, GERD, asthma.    Successful phone outreach to patient. HIPAA details verified and purpose of call explained to patient---she agreed to call.  She states is able to review medications and wants "MD to give her an inhaler that works quicker."    She reports she uses albuterol nebs about twice a day.    Objective:   Current Medications: Current Outpatient Medications  Medication Sig Dispense Refill  . acetaminophen (TYLENOL) 500 MG tablet Take 1,000 mg by mouth every 6 (six) hours as needed for headache (pain).    . CARTIA XT 120 MG 24 hr capsule TAKE 1 CAPSULE(120 MG) BY MOUTH DAILY (Patient taking differently: TAKE 1 CAPSULE(120 MG) BY MOUTH EVERY EVENING) 90 capsule 3  . diltiazem (CARDIZEM) 60 MG tablet TAKE 1 TABLET BY MOUTH EVERY 6 HOURS AS NEEDED FOR RAPID AFIB HEART RATE OVER 100 (Patient taking differently: TAKE 1 TABLET (60 MG) BY MOUTH EVERY 6 HOURS AS NEEDED FOR RAPID AFIB HEART RATE OVER 100) 45 tablet 2  . flecainide (TAMBOCOR) 100 MG tablet Take 1 tablet (100 mg total) by mouth 2 (two) times daily. 60 tablet 6  . fluticasone (FLOVENT HFA) 44 MCG/ACT inhaler Inhale 1 puff into the lungs 2 (two) times daily. 1 Inhaler 12  . losartan (COZAAR) 50 MG tablet Take 1 tablet (50 mg total) by mouth daily. 30 tablet 5  . polyethylene glycol powder (GLYCOLAX/MIRALAX) powder Mix 17g of Miralax in 8  ounces of water every other day (Patient taking differently: Take by mouth See admin instructions. Mix 17 g. in 8 oz water and drink daily as needed for constipation) 255 g 0  . potassium chloride SA (K-DUR,KLOR-CON) 10 MEQ tablet Take 2 tablets (20 mEq total) by mouth daily. (Patient taking differently: Take 10 mEq by mouth 2 (two) times daily. ) 60 tablet 3  . ranitidine (ZANTAC) 150 MG tablet TAKE 1 TABLET BY MOUTH TWICE DAILY (Patient taking differently: TAKE 1 TABLET (150 MG) BY MOUTH TWICE DAILY AS NEEDED FOR INDIGESTION) 60 tablet 0  . rivaroxaban (XARELTO) 20 MG TABS tablet Take 1 tablet (20 mg total) by mouth daily with supper. 30 tablet 6   Current Facility-Administered Medications  Medication Dose Route Frequency Provider Last Rate Last Dose  . 0.9 %  sodium chloride infusion  500 mL Intravenous Continuous Armbruster, Carlota Raspberry, MD        Functional Status: In your present state of health, do you have any difficulty performing the following activities: 08/08/2017 08/05/2017  Hearing? N N  Vision? N N  Difficulty concentrating or making decisions? N N  Walking or climbing stairs? Y N  Comment tired out -  Dressing or bathing? N N  Doing errands, shopping? N N  Some recent data might be hidden    Fall/Depression Screening: Fall Risk  08/08/2017 08/08/2017 08/05/2017  Falls in the past year? No No No  Risk for fall  due to : - - -   PHQ 2/9 Scores 08/08/2017 08/08/2017 08/08/2017 08/05/2017 06/17/2017 04/27/2017 04/15/2017  PHQ - 2 Score 0 0 0 0 0 0 0    Assessment:  Medication review per patient report and review of medication list in chart.    Drugs sorted by system:  Cardiovascular: -diltiazem 60 mg prn  -diltiazem maintenance daily  -flecainide -losartan  -potassium chloride  -rivaroxaban (Xarelto)   Pulmonary/Allergy: -fluticasone HFA (Flovent)  Gastrointestinal: -polyethyene glycol (Miralax)  -ranitidine   Pain: -acetaminophen as needed  Other issues  noted:   -Patient reports she is taking tiotropium (Spiriva Handihaler) daily---it is not on medication list---please confirm if patient should be using this.   -08/09/17 PCP note---patient was to take montelukast---it is not on medication list, she reports she is not taking---please confirm if patient should use montelukast or not   Patient counseling:  -Discussed extensively with patient Flovent is a maintenance or controller inhaler that does not work immediately.  Discussed with her the quick acting medication she is asking her MD for, albuterol, can increase her heart rate and is the medication her PCP is trying to reduce her use of.  Discussed Flovent can take time to work.   Also appears her PCP office communicated Flovent takes time to work to patient per 08/18/17 notes.    Patient counseled to take Flovent as directed by her PCP.    Plan:  Left message with Worthy Flank, PharmD in internal medicine clinic.   Will route note to PCP to request clarification of Spiriva and montelukast use.    Karrie Meres, PharmD, [place] [phone]

## 2017-08-21 ENCOUNTER — Other Ambulatory Visit: Payer: Self-pay

## 2017-08-21 ENCOUNTER — Emergency Department (HOSPITAL_COMMUNITY): Payer: Medicare Other

## 2017-08-21 ENCOUNTER — Encounter (HOSPITAL_COMMUNITY): Payer: Self-pay | Admitting: Emergency Medicine

## 2017-08-21 ENCOUNTER — Emergency Department (HOSPITAL_COMMUNITY)
Admission: EM | Admit: 2017-08-21 | Discharge: 2017-08-21 | Disposition: A | Discharge status: Home / Self Care | Payer: Medicare Other | Attending: Emergency Medicine | Admitting: Emergency Medicine

## 2017-08-21 DIAGNOSIS — Z87891 Personal history of nicotine dependence: Secondary | ICD-10-CM | POA: Diagnosis not present

## 2017-08-21 DIAGNOSIS — Z79899 Other long term (current) drug therapy: Secondary | ICD-10-CM | POA: Diagnosis not present

## 2017-08-21 DIAGNOSIS — I480 Paroxysmal atrial fibrillation: Secondary | ICD-10-CM | POA: Insufficient documentation

## 2017-08-21 DIAGNOSIS — J4521 Mild intermittent asthma with (acute) exacerbation: Secondary | ICD-10-CM | POA: Diagnosis not present

## 2017-08-21 DIAGNOSIS — J45901 Unspecified asthma with (acute) exacerbation: Secondary | ICD-10-CM | POA: Diagnosis not present

## 2017-08-21 DIAGNOSIS — R05 Cough: Secondary | ICD-10-CM | POA: Insufficient documentation

## 2017-08-21 DIAGNOSIS — R0602 Shortness of breath: Secondary | ICD-10-CM | POA: Diagnosis present

## 2017-08-21 DIAGNOSIS — F1290 Cannabis use, unspecified, uncomplicated: Secondary | ICD-10-CM | POA: Diagnosis not present

## 2017-08-21 DIAGNOSIS — Z7901 Long term (current) use of anticoagulants: Secondary | ICD-10-CM | POA: Diagnosis not present

## 2017-08-21 DIAGNOSIS — R079 Chest pain, unspecified: Secondary | ICD-10-CM | POA: Diagnosis not present

## 2017-08-21 LAB — I-STAT TROPONIN, ED: TROPONIN I, POC: 0 ng/mL (ref 0.00–0.08)

## 2017-08-21 MED ORDER — ALBUTEROL SULFATE (2.5 MG/3ML) 0.083% IN NEBU
5.0000 mg | INHALATION_SOLUTION | Freq: Once | RESPIRATORY_TRACT | Status: AC
  Administered 2017-08-21: 5 mg via RESPIRATORY_TRACT
  Filled 2017-08-21: qty 6

## 2017-08-21 MED ORDER — PREDNISONE 20 MG PO TABS
40.0000 mg | ORAL_TABLET | Freq: Once | ORAL | Status: AC
  Administered 2017-08-21: 40 mg via ORAL
  Filled 2017-08-21: qty 2

## 2017-08-21 MED ORDER — ALBUTEROL SULFATE (2.5 MG/3ML) 0.083% IN NEBU: 2.5000 mg | INHALATION_SOLUTION | Freq: Four times a day (QID) | RESPIRATORY_TRACT | 12 refills | Status: DC | PRN

## 2017-08-21 MED ORDER — PREDNISONE 10 MG PO TABS: 40.0000 mg | ORAL_TABLET | Freq: Every day | ORAL | 0 refills | Status: DC

## 2017-08-21 NOTE — ED Triage Notes (Signed)
Pt. Stated, Im having SOB and I have a machine for my SOB around 430 and when I use the machine it puts me in A-Fib, My chest was hurting really bad. But I have to have something so I can breath. They took the inhalers away from me.

## 2017-08-21 NOTE — ED Notes (Signed)
Dr.Ray at bedside at this time.  

## 2017-08-21 NOTE — ED Triage Notes (Signed)
Pt. Requesting another breathing treatment.

## 2017-08-21 NOTE — Discharge Instructions (Signed)
Use your nebulizer up to every four hours. Recheck with your doctor next week. Return if worsening shortness of breath

## 2017-08-21 NOTE — ED Notes (Signed)
Patient requested neb treatment delayed until after she uses the restroom. Patient ambulated to restroom, unassisted, without difficulty. No acute distress noted.

## 2017-08-21 NOTE — ED Provider Notes (Signed)
[facility] EMERGENCY DEPARTMENT Provider Note   CSN: [id number] Arrival date & time: 08/21/17  0938     History   Chief Complaint Chief Complaint  Patient presents with  . Shortness of Breath  . Atrial Fibrillation    HPI [name] is a 66 y.o. female.  HPI   66 y.o. Female ho a fib, asthma states ok loc, sob on awakening this am at 0430, wheezing got up and took neb.  Continues wheezing, but did  Not take second breathing tx.  States she has felt warm, but has not taken temperature.  States she does not have an inhaler and does not take two nebs as it puts her into a fib. STates some cough nonproductive.  Has had flu shot and denies sick exposures.  Denies rhinorhea, sneezing or sore throat,  Nausea, vomiting or diarrhea.  Taking po without difficulty.  Past Medical History:  Diagnosis Date  . A-fib ([place])   . Allergy    seasonal  . Asthma   . Chronic anticoagulation   . GERD (gastroesophageal reflux disease)   . Hypertension   . PAF (paroxysmal atrial fibrillation) ([place]) 11/26/2015   April/May 2017: Multiple ED visits for A. Fib.  02/12/16: Improved on diltiazem IV. Cardiology consulted in the ED and discharge patient with diltiazem XR 120 mg once daily with 60 mg by mouth every 6 hours as needed for symptomatic palpitations, flecainide 50 mg twice daily. Referred to the atrial fibrillation clinic.  02/17/16: Seen in the atrial fibrillation clinic by NP Roderic Palau. Normal sin    Patient Active Problem List   Diagnosis Date Noted  . Allergic contact dermatitis 04/27/2017  . Osteoporosis without current pathological fracture 04/15/2017  . Constipation 10/19/2016  . Normocytic anemia 10/19/2016  . Hyperlipidemia 03/18/2016  . Special screening for malignant neoplasms, colon 03/17/2016  . Eczema 03/17/2016  . Cervical cancer screening 03/17/2016  . Assistance with transportation 03/17/2016  . Hypertension   . GERD (gastroesophageal reflux disease)    . PAF (paroxysmal atrial fibrillation) ([place]) 11/26/2015  . Chronic anticoagulation 11/26/2015  . Folliculitis 18/29/9371  . Marijuana abuse 06/11/2015  . Asthma, mild intermittent     Past Surgical History:  Procedure Laterality Date  . TONSILLECTOMY    . TUBAL LIGATION      OB History    No data available       Home Medications    Prior to Admission medications   Medication Sig Start Date End Date Taking? Authorizing Provider  acetaminophen (TYLENOL) 500 MG tablet Take 1,000 mg by mouth every 6 (six) hours as needed for headache (pain).    [provider]  CARTIA XT 120 MG 24 hr capsule TAKE 1 CAPSULE(120 MG) BY MOUTH DAILY Patient taking differently: TAKE 1 CAPSULE(120 MG) BY MOUTH EVERY EVENING 12/17/16   Sherran Needs, NP  diltiazem (CARDIZEM) 60 MG tablet TAKE 1 TABLET BY MOUTH EVERY 6 HOURS AS NEEDED FOR RAPID AFIB HEART RATE OVER 100 Patient taking differently: TAKE 1 TABLET (60 MG) BY MOUTH EVERY 6 HOURS AS NEEDED FOR RAPID AFIB HEART RATE OVER 100 06/10/17   Sherran Needs, NP  flecainide (TAMBOCOR) 100 MG tablet Take 1 tablet (100 mg total) by mouth 2 (two) times daily. 04/27/17   Lacroce, Hulen Shouts, MD  fluticasone (FLOVENT HFA) 44 MCG/ACT inhaler Inhale 1 puff into the lungs 2 (two) times daily. 08/08/17   Molt, Bethany, DO  losartan (COZAAR) 50 MG tablet Take 1  tablet (50 mg total) by mouth daily. 06/17/17   [name], MD  polyethylene glycol powder (GLYCOLAX/MIRALAX) powder Mix 17g of Miralax in 8 ounces of water every other day Patient taking differently: Take by mouth See admin instructions. Mix 17 g. in 8 oz water and drink daily as needed for constipation 07/20/17   [name], MD  potassium chloride SA (K-DUR,KLOR-CON) 10 MEQ tablet Take 2 tablets (20 mEq total) by mouth daily. Patient taking differently: Take 10 mEq by mouth 2 (two) times daily.  11/12/16   Sherran Needs, NP  ranitidine (ZANTAC) 150 MG tablet TAKE 1 TABLET BY MOUTH TWICE  DAILY Patient taking differently: TAKE 1 TABLET (150 MG) BY MOUTH TWICE DAILY AS NEEDED FOR INDIGESTION 03/16/17   Axel Filler, MD  rivaroxaban (XARELTO) 20 MG TABS tablet Take 1 tablet (20 mg total) by mouth daily with supper. 03/15/17   Sherran Needs, NP    Family History Family History  Problem Relation Age of Onset  . Heart attack Mother        [name] age  . Breast cancer Sister        Late 75s; now s/p mastectomy   . Lung cancer Brother        x 2  . Cancer Sister   . Cancer Sister   . Cancer Sister   . Colon cancer Neg Hx   . Colon polyps Neg Hx   . Esophageal cancer Neg Hx   . Rectal cancer Neg Hx   . Stomach cancer Neg Hx     Social History Social History   Tobacco Use  . Smoking status: Former Smoker    Last attempt to quit: 10/24/2015    Years since quitting: 1.8  . Smokeless tobacco: Never Used  . Tobacco comment: She says this is MAIRJUANA, NOT tobacco  Substance Use Topics  . Alcohol use: No  . Drug use: No    Comment: Smoked marijuana in the past.     Allergies   Protonix [pantoprazole sodium] and Celecoxib   Review of Systems Review of Systems  Constitutional: Positive for fatigue and fever. Negative for activity change, appetite change, chills and diaphoresis.  HENT: Negative for congestion, dental problem, drooling, ear discharge, ear pain, facial swelling, hearing loss, mouth sores, nosebleeds, postnasal drip, rhinorrhea, sinus pressure, sinus pain, sneezing and sore throat.   Eyes: Negative.   Respiratory: Positive for cough, shortness of breath and wheezing. Negative for apnea and chest tightness.   Cardiovascular: Positive for palpitations. Negative for chest pain and leg swelling.  Gastrointestinal: Negative.   Endocrine: Negative.   Genitourinary: Negative.   Musculoskeletal: Negative.   Skin: Negative.   Allergic/Immunologic: Negative.   Neurological: Negative.   Hematological: Negative.   Psychiatric/Behavioral: Negative.     All other systems reviewed and are negative.    Physical Exam Updated Vital Signs BP (!) 171/107 (BP Location: Right Arm)   Pulse 82   Temp 98.4 F (36.9 C) (Oral)   Resp 17   Ht 1.702 m (5\' 7" )   Wt 74.8 kg (165 lb)   SpO2 99%   BMI 25.84 kg/m   Physical Exam   ED Treatments / Results  Labs (all labs ordered are listed, but only abnormal results are displayed) Labs Reviewed  BASIC METABOLIC PANEL  CBC  I-STAT TROPONIN, ED    EKG  EKG Interpretation  Date/Time:  Sunday August 21 2017 08:52:56 EST Ventricular Rate:  81 PR Interval:  162 QRS  Duration: 90 QT Interval:  390 QTC Calculation: 453 R Axis:   57 Text Interpretation:  Normal sinus rhythm Biatrial enlargement Abnormal ECG Confirmed by Pattricia Boss [phone]) on 08/21/2017 10:16:48 AM       Radiology Dg Chest 2 View  Result Date: 08/21/2017 CLINICAL DATA:  66 year old female with a history of shortness of breath and chest pain EXAM: CHEST  2 VIEW COMPARISON:  08/07/2017 FINDINGS: Cardiomediastinal silhouette within normal limits. No evidence of central vascular congestion. No pneumothorax or pleural effusion. No confluent airspace disease. No displaced fracture. IMPRESSION: Negative for acute cardiopulmonary disease Electronically Signed   By: Corrie Mckusick D.O.   On: 08/21/2017 09:51    Procedures Procedures (including critical care time)  Medications Ordered in ED Medications - No data to display   Initial Impression / Assessment and Plan / ED Course  I have reviewed the triage vital signs and the nursing notes.  Pertinent labs & imaging results that were available during my care of the patient were reviewed by me and considered in my medical decision making (see chart for details).    Given albuterol and prednisone here.  Has been on monitor and no atrial fibrillation.  She feels greatly improved and has improved air movement without wheezing.  She requests albuterol solution for  home.  Final Clinical Impressions(s) / ED Diagnoses   Final diagnoses:  Mild asthma with exacerbation, unspecified whether persistent  Paroxysmal A-fib [facility])    ED Discharge Orders    None       Pattricia Boss, MD 08/21/17 [phone]

## 2017-08-22 ENCOUNTER — Other Ambulatory Visit: Payer: Self-pay | Admitting: *Deleted

## 2017-08-22 NOTE — Patient Outreach (Signed)
[facility]) Care Management  08/22/2017  BEDELIA PONG 11/13/1950 [id number]   Referral received from telephonic care manager requesting community involvement for assessment of home care needs.  Per chart, member has history of asthma, atrial fibrillation, hypertension, and hyperlipidemia.  Call placed to member for telephone assessment, no answer.  HIPAA compliant voice message left.  Will await call back, if no call back will have covering care manager contact within the next 3 business days.  [name], [place], MSN [place] [phone]

## 2017-08-24 ENCOUNTER — Other Ambulatory Visit: Payer: Self-pay | Admitting: Pharmacist

## 2017-08-24 NOTE — Patient Outreach (Signed)
[place] [facility]) Care Management  08/24/2017  [name] May 10, 1951 [id number]  Successful phone outreach to patient, HIPAA details verified.  Noted per chart review, patient had ED visit 08/21/17 and was discharged with prednisone taper and albuterol nebs.    Patient today reports she is continuing to take Flovent and Spiriva (not on medication list).  She reports she is not taking montelukast (not on medication list) but in PCP note from 08/09/17.   Discussed with patient have not heard from PCP office yet regarding medication questions.   Plan:  Will outreach PharmD in PCP clinic.   Karrie Meres, PharmD, [place] [phone]

## 2017-08-26 ENCOUNTER — Other Ambulatory Visit: Payer: Self-pay | Admitting: *Deleted

## 2017-08-26 ENCOUNTER — Other Ambulatory Visit (HOSPITAL_COMMUNITY): Payer: Self-pay

## 2017-08-26 ENCOUNTER — Ambulatory Visit (HOSPITAL_COMMUNITY)
Admission: RE | Admit: 2017-08-26 | Discharge: 2017-08-26 | Disposition: A | Discharge status: Home / Self Care | Payer: Medicare Other | Source: Ambulatory Visit | Attending: Nurse Practitioner | Admitting: Nurse Practitioner

## 2017-08-26 DIAGNOSIS — I10 Essential (primary) hypertension: Secondary | ICD-10-CM | POA: Diagnosis not present

## 2017-08-26 DIAGNOSIS — E785 Hyperlipidemia, unspecified: Secondary | ICD-10-CM | POA: Diagnosis not present

## 2017-08-26 DIAGNOSIS — I480 Paroxysmal atrial fibrillation: Secondary | ICD-10-CM | POA: Diagnosis not present

## 2017-08-26 NOTE — Progress Notes (Signed)
  Echocardiogram 2D Echocardiogram has been performed.  [name] T Camay Pedigo 08/26/2017, 10:52 AM

## 2017-08-26 NOTE — Patient Outreach (Addendum)
[name]  02-Apr-1951  [id number]  Successful telephone encounter to [name], 68 year old female as this RN CM covering for primary RN CM-  follow up on referral received from telephonic RN CM requesting Community involvement for assessment of care needs, pt had multiple ED visits for Atrial fib due to frequent use of inhaler for asthma treatment, most recent 08/21/17. Pt's history includes but not limited to asthma, hypertension, hyperlipidemia, atrial fib.   Spoke with pt, HIPAA identifiers verified, discussed purpose of call- follow up on referral.  Pt reports not using the Albuterol inhaler anymore, still doing the Flovent plus nebulizer treatment once a day.  Pt reports taking all of her medications,had an ECHO done today, to see PCP 1/18.   Pt reports has issues with breathing, gets sob,on 2 Liters [place].  Informed pt covering for primary RN CM who relayed is available to do a home visit with her  1/17 to which pt agreed.  RN CM provided pt with contact name and number to call if needed.    Plan:  Provide update to [facility] pt's  primary RN CM on follow up call.    Note:  Address provided by pt different than in demographics            [address] [place], [place] [address]  [name].   [place] [facility]  [phone]

## 2017-08-29 ENCOUNTER — Ambulatory Visit (INDEPENDENT_AMBULATORY_CARE_PROVIDER_SITE_OTHER): Payer: Medicare Other | Admitting: Internal Medicine

## 2017-08-29 ENCOUNTER — Encounter: Payer: Self-pay | Admitting: Internal Medicine

## 2017-08-29 ENCOUNTER — Encounter: Payer: Self-pay | Admitting: Pharmacist

## 2017-08-29 VITALS — BP 138/86 | HR 61 | Ht 67.0 in | Wt 167.2 lb

## 2017-08-29 DIAGNOSIS — I480 Paroxysmal atrial fibrillation: Secondary | ICD-10-CM | POA: Diagnosis not present

## 2017-08-29 NOTE — Progress Notes (Signed)
Electrophysiology Office Note   Date:  08/29/2017   ID:  [name], DOB 04/27/1951, MRN [id number]  PCP:  [name], MD    Primary Electrophysiologist: Thompson Grayer, MD    CC: afib   History of Present Illness: [name] is a 67 y.o. female who presents today for electrophysiology evaluation.   She is referred by Roderic Palau NP in the AF clinic for EP consultation regarding atrial fibrillation. She has been diagnosed with atrial fibrillation for over a year.  She reports palpitations and nervousness with her afib.  Episodes seem to be worse when she has asthma flare.  She has had multiple ED visits for her afib.  She has also been seen in the AF clinic.  She has failed medical therapy with flecainide.  She is also on diltiazem and xarelto.  Today, she denies symptoms of chest pain, shortness of breath, orthopnea, PND, lower extremity edema, claudication, dizziness, presyncope, syncope, bleeding, or neurologic sequela. The patient is tolerating medications without difficulties and is otherwise without complaint today.    Past Medical History:  Diagnosis Date  . A-fib ([place])   . Allergy    seasonal  . Asthma   . Chronic anticoagulation   . GERD (gastroesophageal reflux disease)   . Hypertension   . PAF (paroxysmal atrial fibrillation) ([place]) 11/26/2015   April/May 2017: Multiple ED visits for A. Fib.  02/12/16: Improved on diltiazem IV. Cardiology consulted in the ED and discharge patient with diltiazem XR 120 mg once daily with 60 mg by mouth every 6 hours as needed for symptomatic palpitations, flecainide 50 mg twice daily. Referred to the atrial fibrillation clinic.  02/17/16: Seen in the atrial fibrillation clinic by NP Roderic Palau. Normal sin   Past Surgical History:  Procedure Laterality Date  . TONSILLECTOMY    . TUBAL LIGATION       Current Outpatient Medications  Medication Sig Dispense Refill  . acetaminophen (TYLENOL) 500 MG tablet Take 1,000 mg by mouth  every 6 (six) hours as needed for headache (pain).    Marland Kitchen albuterol (PROVENTIL) (2.5 MG/3ML) 0.083% nebulizer solution Take 3 mLs (2.5 mg total) by nebulization every 6 (six) hours as needed for wheezing or shortness of breath. 75 mL 12  . CARTIA XT 120 MG 24 hr capsule TAKE 1 CAPSULE(120 MG) BY MOUTH DAILY (Patient taking differently: TAKE 1 CAPSULE(120 MG) BY MOUTH EVERY EVENING) 90 capsule 3  . diltiazem (CARDIZEM) 60 MG tablet TAKE 1 TABLET BY MOUTH EVERY 6 HOURS AS NEEDED FOR RAPID AFIB HEART RATE OVER 100 (Patient taking differently: TAKE 1 TABLET (60 MG) BY MOUTH EVERY 6 HOURS AS NEEDED FOR RAPID AFIB HEART RATE OVER 100) 45 tablet 2  . flecainide (TAMBOCOR) 100 MG tablet Take 1 tablet (100 mg total) by mouth 2 (two) times daily. 60 tablet 6  . fluticasone (FLOVENT HFA) 44 MCG/ACT inhaler Inhale 1 puff into the lungs 2 (two) times daily. 1 Inhaler 12  . losartan (COZAAR) 50 MG tablet Take 1 tablet (50 mg total) by mouth daily. 30 tablet 5  . polyethylene glycol powder (GLYCOLAX/MIRALAX) powder Mix 17g of Miralax in 8 ounces of water every other day (Patient taking differently: Take by mouth See admin instructions. Mix 17 g. in 8 oz water and drink daily as needed for constipation) 255 g 0  . potassium chloride SA (K-DUR,KLOR-CON) 10 MEQ tablet Take 2 tablets (20 mEq total) by mouth daily. (Patient taking differently: Take 10 mEq by mouth  2 (two) times daily. ) 60 tablet 3  . predniSONE (DELTASONE) 10 MG tablet Take 4 tablets (40 mg total) by mouth daily. 20 tablet 0  . ranitidine (ZANTAC) 150 MG tablet TAKE 1 TABLET BY MOUTH TWICE DAILY (Patient taking differently: TAKE 1 TABLET (150 MG) BY MOUTH TWICE DAILY AS NEEDED FOR INDIGESTION) 60 tablet 0  . rivaroxaban (XARELTO) 20 MG TABS tablet Take 1 tablet (20 mg total) by mouth daily with supper. 30 tablet 6   Current Facility-Administered Medications  Medication Dose Route Frequency Provider Last Rate Last Dose  . 0.9 %  sodium chloride infusion   500 mL Intravenous Continuous Armbruster, Carlota Raspberry, MD        Allergies:   Protonix [pantoprazole sodium] and Celecoxib   Social History:  The patient  reports that she quit smoking about 22 months ago. she has never used smokeless tobacco. She reports that she does not drink alcohol or use drugs.   Family History:  The patient's  family history includes Breast cancer in her sister; Cancer in her sister, sister, and sister; Heart attack in her mother; Lung cancer in her brother.    ROS:  Please see the history of present illness.   All other systems are personally reviewed and negative.    PHYSICAL EXAM: VS:  Pulse 61   Ht 5\' 7"  (1.702 m)   Wt 167 lb 3.2 oz (75.8 kg)   BMI 26.19 kg/m  , BMI Body mass index is 26.19 kg/m. GEN: Well nourished, well developed, in no acute distress  HEENT: normal  Neck: no JVD, carotid bruits, or masses Cardiac: RRR; no murmurs, rubs, or gallops,no edema  Respiratory:  clear to auscultation bilaterally, normal work of breathing GI: soft, nontender, nondistended, + BS MS: no deformity or atrophy  Skin: warm and dry  Neuro:  Strength and sensation are intact Psych: euthymic mood, full affect  EKG:  EKG is ordered today. The ekg ordered today is personally reviewed and shows sinus rhythm 61 bpm, PR 154 msec, QRS 96 msec, QTc 438 msec, LVH   Recent Labs: 09/25/2016: Magnesium 2.1 08/08/2017: B Natriuretic Peptide 43.2; BUN 12; Creatinine, Ser 0.81; Hemoglobin 13.6; Platelets 223; Potassium 3.7; Sodium 139  personally reviewed   Lipid Panel     Component Value Date/Time   CHOL 245 (H) 03/17/2016 1015   TRIG 95 03/17/2016 1015   HDL 94 03/17/2016 1015   CHOLHDL 2.6 03/17/2016 1015   CHOLHDL 3.1 03/24/2010 0657   VLDL 11 03/24/2010 0657   LDLCALC 132 (H) 03/17/2016 1015   personally reviewed   Wt Readings from Last 3 Encounters:  08/29/17 167 lb 3.2 oz (75.8 kg)  08/21/17 165 lb (74.8 kg)  08/08/17 164 lb (74.4 kg)    ekg 08/08/17- afib  with elevated V rates  Other studies personally reviewed: Additional studies/ records that were reviewed today include: echo 08/26/17 is reviewed, AF clinic notes are reviewed  Review of the above records today demonstrates: as above   ASSESSMENT AND PLAN:  1.  Paroxysmal atrial fibrillation The patient has symptomatic paroxysmal atrial fibrillation.  This seems to be worsened by her asthma and treatments thereof. Therapeutic strategies for afib including medicine and ablation were discussed in detail with the patient today.  Given asthma, I would not advise sotalol or amiodarone.  I think that multaq or diltiazem would be our best options.   Risk, benefits, and alternatives to EP study and radiofrequency ablation for afib were also discussed in  detail today. These risks include but are not limited to stroke, bleeding, vascular damage, tamponade, perforation, damage to the esophagus, lungs, and other structures, pulmonary vein stenosis, worsening renal function, and death. The patient understands these risk and wishes to not proceed at this time.  She states "I havent been having afib for the last few days.  I think I will just wait and see".  She will contact the afib clinic if she decides to proceed.  We would plan cardiac CT prior to the procedure to exclude LAA thrombus.  Follow-up:  3 months in the AF clinic  Current medicines are reviewed at length with the patient today.   The patient does not have concerns regarding her medicines.  The following changes were made today:  none   Signed, Thompson Grayer, MD  08/29/2017 2:45 PM     [facility] [address] [place]  [address] [phone] (office) [phone] (fax)

## 2017-08-29 NOTE — Progress Notes (Signed)
Working with Lennette Bihari, [facility] pharmacist, to clarify Spiriva and Singulair.

## 2017-08-29 NOTE — Patient Instructions (Signed)
Medication Instructions:  Your physician recommends that you continue on your current medications as directed. Please refer to the Current Medication list given to you today.  * If you need a refill on your cardiac medications before your next appointment, please call your pharmacy. *  Labwork: None ordered  Testing/Procedures: None ordered  Follow-Up: Your physician recommends that you schedule a follow-up appointment in: 3 months with Roderic Palau, NP in the AFIB clinic.  Thank you for choosing [facility]!!

## 2017-08-31 ENCOUNTER — Telehealth: Payer: Self-pay

## 2017-08-31 ENCOUNTER — Other Ambulatory Visit: Payer: Self-pay | Admitting: Internal Medicine

## 2017-08-31 NOTE — Telephone Encounter (Signed)
Lm for rtc 

## 2017-08-31 NOTE — Telephone Encounter (Signed)
Needs to speak with a nursing about meds. Please call back.

## 2017-09-01 ENCOUNTER — Ambulatory Visit (HOSPITAL_COMMUNITY): Payer: Medicare Other

## 2017-09-01 NOTE — Telephone Encounter (Signed)
Lm for rtc, went straight to vmail

## 2017-09-05 NOTE — Telephone Encounter (Signed)
t was discontinued by another provider, as it might be a trigger for her A Fib.although that is not it's common side effect. Please make an appointment with me to discuss.

## 2017-09-06 ENCOUNTER — Other Ambulatory Visit: Payer: Self-pay | Admitting: *Deleted

## 2017-09-06 ENCOUNTER — Other Ambulatory Visit (HOSPITAL_COMMUNITY): Payer: Self-pay | Admitting: *Deleted

## 2017-09-06 DIAGNOSIS — I4819 Other persistent atrial fibrillation: Secondary | ICD-10-CM

## 2017-09-06 NOTE — Patient Outreach (Signed)
[place] [facility]) Care Management  09/06/2017  [name] March 03, 1951 [id number]   Call placed to member as reminder of home visit scheduled for this week.  No answer, HIPAA compliant voice message left.  Will place call prior to going to home for visit.  [name], [place], MSN [place] [phone]

## 2017-09-07 ENCOUNTER — Telehealth (HOSPITAL_COMMUNITY): Payer: Self-pay | Admitting: *Deleted

## 2017-09-07 NOTE — Telephone Encounter (Signed)
Patient scheduled for afib ablation with Dr. Rayann Heman on 2/8 @ 730am. Pt to arrive at 530am NPO after MN. No medications (including no xarelto) morning of procedure. She will stay overnight in the hospital.  Pt understands to have labs drawn on 1/25 at the [address] lab. Pt also verbalizes understanding pt needs cardiac ct which will be scheduled within the week prior to ablation and scheduling will be calling for this. Pt verbalized understanding of all instructions.

## 2017-09-08 ENCOUNTER — Other Ambulatory Visit: Payer: Self-pay | Admitting: *Deleted

## 2017-09-08 ENCOUNTER — Encounter: Payer: Self-pay | Admitting: *Deleted

## 2017-09-08 ENCOUNTER — Other Ambulatory Visit (HOSPITAL_COMMUNITY): Payer: Self-pay | Admitting: *Deleted

## 2017-09-08 DIAGNOSIS — I4819 Other persistent atrial fibrillation: Secondary | ICD-10-CM

## 2017-09-08 NOTE — Patient Outreach (Signed)
[facility]) Care Management   09/08/2017  [name] [birth year] [id number]  [name] is an 67 y.o. female  Subjective:   Member alert and oriented x3.  Denies any chest pain or discomfort, denies any palpitations.  Report compliance with medications and MD visits.  State she has transportation through [facility].  Objective:   Review of Systems  Constitutional: Negative.   HENT: Negative.   Eyes: Negative.   Respiratory: Negative.   Cardiovascular: Negative.   Gastrointestinal: Negative.   Genitourinary: Negative.   Musculoskeletal: Negative.   Skin: Negative.   Neurological: Negative.   Endo/Heme/Allergies: Negative.   Psychiatric/Behavioral: Negative.     Physical Exam  Constitutional: She is oriented to person, place, and time. She appears well-developed and well-nourished.  Neck: Normal range of motion.  Cardiovascular: Normal rate.  Irregular  Respiratory: Effort normal and breath sounds normal.  GI: Soft. Bowel sounds are normal.  Musculoskeletal: Normal range of motion.  Neurological: She is alert and oriented to person, place, and time.  Skin: Skin is warm and dry.   BP 132/87 (BP Location: Left Arm, Patient Position: Sitting, Cuff Size: Normal)   Pulse 80   Resp 18   Ht 1.702 m (_0 )   Wt 167 lb (75.8 kg)   SpO2 96%   BMI 26.16 kg/m   Encounter Medications:   Outpatient Encounter Medications as of 09/08/2017  Medication Sig Note  . acetaminophen (TYLENOL) 500 MG tablet Take 1,000 mg by mouth every 6 (six) hours as needed for headache (pain).   Marland Kitchen albuterol (PROVENTIL) (2.5 MG/3ML) 0.083% nebulizer solution Take 3 mLs (2.5 mg total) by nebulization every 6 (six) hours as needed for wheezing or shortness of breath.   . CARTIA XT 120 MG 24 hr capsule TAKE 1 CAPSULE(120 MG) BY MOUTH DAILY (Patient taking differently: TAKE 1 CAPSULE(120 MG) BY MOUTH EVERY EVENING)   . diltiazem (CARDIZEM) 60 MG tablet TAKE 1 TABLET BY MOUTH  EVERY 6 HOURS AS NEEDED FOR RAPID AFIB HEART RATE OVER 100   . flecainide (TAMBOCOR) 100 MG tablet Take 1 tablet (100 mg total) by mouth 2 (two) times daily.   . fluticasone (FLOVENT HFA) 44 MCG/ACT inhaler Inhale 1 puff into the lungs 2 (two) times daily.   Marland Kitchen losartan (COZAAR) 50 MG tablet Take 1 tablet (50 mg total) by mouth daily.   . montelukast (SINGULAIR) 10 MG tablet Take 10 mg by mouth at bedtime. 08/29/2017: PCP please address: patient still taking, but med was removed from list. Need to send a new prescription if med is continued.   . polyethylene glycol powder (GLYCOLAX/MIRALAX) powder Mix 17g of Miralax in 8 ounces of water every other day (Patient taking differently: Take by mouth See admin instructions. Mix 17 g. in 8 oz water and drink daily as needed for constipation)   . potassium chloride SA (K-DUR,KLOR-CON) 10 MEQ tablet Take 2 tablets (20 mEq total) by mouth daily. (Patient taking differently: Take 10 mEq by mouth 2 (two) times daily. )   . ranitidine (ZANTAC) 150 MG tablet TAKE 1 TABLET BY MOUTH TWICE DAILY (Patient taking differently: TAKE 1 TABLET (150 MG) BY MOUTH TWICE DAILY AS NEEDED FOR INDIGESTION)   . rivaroxaban (XARELTO) 20 MG TABS tablet Take 1 tablet (20 mg total) by mouth daily with supper.   . tiotropium (SPIRIVA) 18 MCG inhalation capsule Place 18 mcg into inhaler and inhale daily. 08/29/2017: PCP please address: patient still taking, but med was removed  from list. Need to send a new prescription if med is continued.  . predniSONE (DELTASONE) 10 MG tablet Take 4 tablets (40 mg total) by mouth daily. (Patient not taking: Reported on 09/08/2017)    No facility-administered encounter medications on file as of 09/08/2017.     Functional Status:   In your present state of health, do you have any difficulty performing the following activities: 09/08/2017 08/08/2017  Hearing? N N  Vision? N N  Difficulty concentrating or making decisions? N N  Walking or climbing stairs? N Y   Comment - tired out  Dressing or bathing? N N  Doing errands, shopping? N N  Preparing Food and eating ? N -  Using the Toilet? N -  In the past six months, have you accidently leaked urine? N -  Do you have problems with loss of bowel control? N -  Managing your Medications? N -  Managing your Finances? N -  Housekeeping or managing your Housekeeping? N -  Some recent data might be hidden    Fall/Depression Screening:    Fall Risk  09/08/2017 08/08/2017 08/08/2017  Falls in the past year? No No No  Risk for fall due to : - - -   PHQ 2/9 Scores 09/08/2017 08/08/2017 08/08/2017 08/08/2017 08/05/2017 06/17/2017 04/27/2017  PHQ - 2 Score 0 0 0 0 0 0 0    Assessment:    Met with member at scheduled time.  [facility] care management services again explained, consent obtained.  No distress noted, state she is ready for her procedure.  Has appointment tomorrow with primary MD office, next week with cardiology office for labs.  Denies any urgent concerns.    Provided with [facility] calendar book, including logs for recording blood pressure and heart rate.  Advised of 24 hour nurse triage line.  Provided member with contact information for this care manager, advised to contact with questions.  Plan:   Will follow up with member after scheduled ablation.  Will have home visit next month.  THN CM Care Plan Problem One     Most Recent Value  Care Plan Problem One  Risk for hospitalization related to atrial fibrillation as evicenced by multiple ED visits   Role Documenting the Problem One  Care Management [name] for Problem One  Active  [facility] Long Term Goal   Member will not have any ED visits within the next 31 days  THN Long Term Goal Start Date  09/09/17  Interventions for Problem One Long Term Goal  A-fib action plan reviewed with member, PRN medications reviewed as well.  THN CM Short Term Goal #1   Member will have ablation complete within the next 3 weeks  THN CM Short Term Goal #1  Start Date  09/09/17  Interventions for Short Term Goal #1  Educated on cardiac ablation, including complications, pre-op and post op expectations.  Education video provided with expections of ablation.  THN CM Short Term Goal #2   Member will report compliance with medications as instructed over the next 30 days  THN CM Short Term Goal #2 Start Date  09/09/17  Interventions for Short Term Goal #2  Medications reviewed in the home with the member.  Educated on importance of taking them as instructed in effort to [facility] a normal rate.     [name], [place], MSN [place] [phone]

## 2017-09-09 ENCOUNTER — Ambulatory Visit: Payer: Self-pay | Admitting: Pharmacist

## 2017-09-09 ENCOUNTER — Encounter: Payer: Self-pay | Admitting: Internal Medicine

## 2017-09-09 ENCOUNTER — Ambulatory Visit (INDEPENDENT_AMBULATORY_CARE_PROVIDER_SITE_OTHER): Payer: Medicare Other | Admitting: Internal Medicine

## 2017-09-09 ENCOUNTER — Encounter: Payer: Self-pay | Admitting: *Deleted

## 2017-09-09 ENCOUNTER — Other Ambulatory Visit: Payer: Self-pay

## 2017-09-09 VITALS — BP 125/73 | HR 66 | Temp 97.9°F | Ht 67.0 in | Wt 163.5 lb

## 2017-09-09 DIAGNOSIS — L858 Other specified epidermal thickening: Secondary | ICD-10-CM | POA: Diagnosis not present

## 2017-09-09 DIAGNOSIS — I480 Paroxysmal atrial fibrillation: Secondary | ICD-10-CM | POA: Diagnosis not present

## 2017-09-09 DIAGNOSIS — Z87891 Personal history of nicotine dependence: Secondary | ICD-10-CM | POA: Diagnosis not present

## 2017-09-09 DIAGNOSIS — Z79899 Other long term (current) drug therapy: Secondary | ICD-10-CM | POA: Diagnosis not present

## 2017-09-09 DIAGNOSIS — M810 Age-related osteoporosis without current pathological fracture: Secondary | ICD-10-CM

## 2017-09-09 DIAGNOSIS — I10 Essential (primary) hypertension: Secondary | ICD-10-CM

## 2017-09-09 DIAGNOSIS — J4520 Mild intermittent asthma, uncomplicated: Secondary | ICD-10-CM

## 2017-09-09 DIAGNOSIS — L918 Other hypertrophic disorders of the skin: Secondary | ICD-10-CM

## 2017-09-09 DIAGNOSIS — L819 Disorder of pigmentation, unspecified: Secondary | ICD-10-CM

## 2017-09-09 MED ORDER — MONTELUKAST SODIUM 10 MG PO TABS: 10.0000 mg | ORAL_TABLET | Freq: Every day | ORAL | 1 refills | Status: DC

## 2017-09-09 MED ORDER — TIOTROPIUM BROMIDE MONOHYDRATE 18 MCG IN CAPS: 18.0000 ug | ORAL_CAPSULE | Freq: Every day | RESPIRATORY_TRACT | 2 refills | Status: DC

## 2017-09-09 NOTE — Assessment & Plan Note (Signed)
Her recurrent exacerbation of asthma is well controlled with Singulair and Spiriva.  During previous ED visit she was advised to discontinue Singulair and Spiriva by a nurse practitioner due to paroxysmal A. Fib.  According to literature Singulair and Spiriva is not associated with recurrence of A. fib.  Patient continue to use it without any further flareup and states that it really helped with her breathing.  She was provided with new prescription for Singulair and Spiriva along with Flovent. She was advised to avoid albuterol as it do cause recurrence of A. fib.

## 2017-09-09 NOTE — Assessment & Plan Note (Signed)
She had a long-standing history of pleuritic hyperpigmented skin lesion on her right leg, which was biopsied in 2017 which shows dyskeratotic keratinocytes more consistent with chronic inflammation.  Patient continued to experience bad itching in her lesion if she stopped using steroid cream, her lesion remains unchanged. She was asking for a dermatology referral, which was provided.

## 2017-09-09 NOTE — Progress Notes (Signed)
   CC: For follow-up of her hypertension and asthma.  HPI:  [name] is a 67 y.o. with past medical history as listed below came to the clinic for follow-up of her hypertension and asthma.  She has no complaints today, feeling better, occasionally becoming fatigued and was asking about some vitamin supplement.  She was also asking for a dermatology referral for her right leg hyperpigmented lesion as it is not improving.  Please see assessment and plan for her chronic conditions.  Past Medical History:  Diagnosis Date  . A-fib ([place])   . Allergy    seasonal  . Asthma   . Chronic anticoagulation   . GERD (gastroesophageal reflux disease)   . Hypertension   . PAF (paroxysmal atrial fibrillation) ([place]) 11/26/2015   April/May 2017: Multiple ED visits for A. Fib.  02/12/16: Improved on diltiazem IV. Cardiology consulted in the ED and discharge patient with diltiazem XR 120 mg once daily with 60 mg by mouth every 6 hours as needed for symptomatic palpitations, flecainide 50 mg twice daily. Referred to the atrial fibrillation clinic.  02/17/16: Seen in the atrial fibrillation clinic by NP Roderic Palau. Normal sin   Review of Systems: Negative except mentioned in HPI.  Physical Exam:  Vitals:   09/09/17 1427  BP: 125/73  Pulse: 66  Temp: 97.9 F (36.6 C)  SpO2: 99%  Weight: 163 lb 8 oz (74.2 kg)    General: Vital signs reviewed.  Patient is well-developed and well-nourished, in no acute distress and cooperative with exam.  Head: Normocephalic and atraumatic. Eyes: EOMI, conjunctivae normal, no scleral icterus.  Neck: Supple, trachea midline, normal ROM, no JVD, masses, thyromegaly, or carotid bruit present.  Cardiovascular: RRR, S1 normal, S2 normal, no murmurs, gallops, or rubs. Pulmonary/Chest: Clear to auscultation bilaterally, no wheezes, rales, or rhonchi. Abdominal: Soft, non-tender, non-distended, BS +, no masses, organomegaly, or guarding present.  Extremities: No  lower extremity edema bilaterally,  pulses symmetric and intact bilaterally. No cyanosis or clubbing. Skin: Warm, dry and intact.  Hyperpigmented rectangle skin lesion on right medial leg. Psychiatric: Normal mood and affect. speech and behavior is normal. Cognition and memory are normal.  Assessment & Plan:   See Encounters Tab for problem based charting.  Patient discussed with Dr. Beryle Beams.

## 2017-09-09 NOTE — Patient Instructions (Addendum)
Thank you for visiting clinic today. I am glad you are doing well, please follow-up with cardiology for your procedure according to your scheduled appointment. Continue taking your medications as directed. I am also giving you a referral for dermatology. You can try any multivitamin supplement, Centrum, once a day woman or a B complex either one is okay. Follow-up with me in 59-month.

## 2017-09-09 NOTE — Assessment & Plan Note (Signed)
BP Readings from Last 3 Encounters:  09/09/17 125/73  09/08/17 132/87  08/29/17 138/86   She was normotensive today.  Continue current management.

## 2017-09-09 NOTE — Assessment & Plan Note (Signed)
She follow-up at A. fib clinic.  She continued to get recurrent episodes of paroxysmal atrial fibrillation requiring multiple ED visits.  She is scheduled for ablation on September 30, 2017.  She was advised to continue her current medication and follow-up with cardiology according to her schedule appointments for ablation and with A. fib clinic.

## 2017-09-12 NOTE — Progress Notes (Signed)
Medicine attending: Medical history, presenting problems, physical findings, and medications, reviewed with resident physician Dr Gwenette Greet on the day of the patient visit and I concur with her evaluation and management plan.

## 2017-09-13 ENCOUNTER — Other Ambulatory Visit: Payer: Self-pay | Admitting: Pharmacist

## 2017-09-13 NOTE — Patient Outreach (Signed)
[facility]) Care Management  09/13/2017  [name] 03-Jan-1951 [id number]  Successful phone outreach to patient, HIPAA details verified.  Purpose of call to follow-up on her medications after she saw her PCP last week.   Per PCP note in chart from 09/09/17, patient is to use tiotropium and montelukast.   Patient confirms she is using these medications.  She reports she feels her breathing is better.    Patient reports she has no further pharmacy related needs since her PCP clarified her medications.    She reports [name] RN was going to look into a bedside commode for her and asked on an update.  Discussed with patient [name] Pharmacist unsure of status but would follow-up with [facility] RN.   Plan:  As patient denies further pharmacy needs and her medication discrepancies from 08/18/17 note have been clarified by her PCP, will close [name] episode.   Call placed to [name] regarding patient's request for update on bedside commode.   Patient aware she can contact [name] Pharmacist if new medication needs arise.    Update [name], PharmD in Internal Medicine clinic via in-basket message.    [name], PharmD, [place] [phone]

## 2017-09-15 ENCOUNTER — Telehealth: Payer: Self-pay | Admitting: Internal Medicine

## 2017-09-15 ENCOUNTER — Encounter (HOSPITAL_COMMUNITY): Payer: Self-pay | Admitting: *Deleted

## 2017-09-15 ENCOUNTER — Telehealth (HOSPITAL_COMMUNITY): Payer: Self-pay | Admitting: *Deleted

## 2017-09-15 ENCOUNTER — Telehealth: Payer: Self-pay

## 2017-09-15 NOTE — Telephone Encounter (Addendum)
ERROR

## 2017-09-15 NOTE — Telephone Encounter (Signed)
Patient calling, has some questions about her cath lab scheduled for 09-30-17.

## 2017-09-15 NOTE — Telephone Encounter (Signed)
Follow up    Patient calling requesting all instructions for upcoming procedure be sent to her in the mail.

## 2017-09-16 ENCOUNTER — Other Ambulatory Visit: Payer: Medicare Other | Admitting: *Deleted

## 2017-09-16 ENCOUNTER — Other Ambulatory Visit: Payer: Self-pay

## 2017-09-16 DIAGNOSIS — I480 Paroxysmal atrial fibrillation: Secondary | ICD-10-CM | POA: Diagnosis not present

## 2017-09-16 DIAGNOSIS — I4819 Other persistent atrial fibrillation: Secondary | ICD-10-CM

## 2017-09-16 NOTE — Telephone Encounter (Signed)
Letter with ablation instructions were mailed to patients home.

## 2017-09-17 LAB — CBC WITH DIFFERENTIAL/PLATELET
Basophils Absolute: 0 10*3/uL (ref 0.0–0.2)
Basos: 0 %
EOS (ABSOLUTE): 0.2 10*3/uL (ref 0.0–0.4)
EOS: 4 %
HEMOGLOBIN: 12.4 g/dL (ref 11.1–15.9)
Hematocrit: 36.4 % (ref 34.0–46.6)
IMMATURE GRANS (ABS): 0 10*3/uL (ref 0.0–0.1)
IMMATURE GRANULOCYTES: 0 %
LYMPHS: 40 %
Lymphocytes Absolute: 1.4 10*3/uL (ref 0.7–3.1)
MCH: 27.4 pg (ref 26.6–33.0)
MCHC: 34.1 g/dL (ref 31.5–35.7)
MCV: 80 fL (ref 79–97)
MONOCYTES: 13 %
Monocytes Absolute: 0.4 10*3/uL (ref 0.1–0.9)
NEUTROS ABS: 1.5 10*3/uL (ref 1.4–7.0)
NEUTROS PCT: 43 %
PLATELETS: 281 10*3/uL (ref 150–379)
RBC: 4.53 x10E6/uL (ref 3.77–5.28)
RDW: 14.6 % (ref 12.3–15.4)
WBC: 3.5 10*3/uL (ref 3.4–10.8)

## 2017-09-17 LAB — BASIC METABOLIC PANEL
BUN/Creatinine Ratio: 16 (ref 12–28)
BUN: 12 mg/dL (ref 8–27)
CALCIUM: 9.5 mg/dL (ref 8.7–10.3)
CHLORIDE: 105 mmol/L (ref 96–106)
CO2: 24 mmol/L (ref 20–29)
Creatinine, Ser: 0.77 mg/dL (ref 0.57–1.00)
GFR calc non Af Amer: 80 mL/min/{1.73_m2} (ref >59)
GFR, EST AFRICAN AMERICAN: 92 mL/min/{1.73_m2} (ref >59)
Glucose: 91 mg/dL (ref 65–99)
POTASSIUM: 3.9 mmol/L (ref 3.5–5.2)
Sodium: 141 mmol/L (ref 134–144)

## 2017-09-19 NOTE — Telephone Encounter (Signed)
Instructions for cardiac CT & ablation were reviewed with patient. Pt verbalized understanding.

## 2017-09-19 NOTE — Telephone Encounter (Signed)
Follow up  Patient calling about a message left on her vmail regarding a letter. Please call

## 2017-09-20 ENCOUNTER — Other Ambulatory Visit (HOSPITAL_COMMUNITY): Payer: Self-pay | Admitting: Internal Medicine

## 2017-09-20 ENCOUNTER — Other Ambulatory Visit (HOSPITAL_COMMUNITY): Payer: Self-pay | Admitting: *Deleted

## 2017-09-20 MED ORDER — METOPROLOL TARTRATE 50 MG PO TABS: ORAL_TABLET | ORAL | 0 refills | Status: DC

## 2017-09-20 NOTE — Addendum Note (Signed)
Addended byValente David on: 09/20/2017 08:10 PM   Modules accepted: Orders

## 2017-09-21 ENCOUNTER — Other Ambulatory Visit: Payer: Self-pay | Admitting: Licensed Clinical Social Worker

## 2017-09-21 NOTE — Patient Outreach (Signed)
[place] [facility]) Care Management  09/21/2017  [name] 09-21-1950 [id number]  Assessment- Care coordination call took place with [place]. [facility] RNCM will refer back to [facility] CSW if patient is unable to get desired medical equipment after her procedure on 09/30/17. [facility] RNCM will notify [facility].  Plan-CSW will not open case at this time.  [name], BSW, MSW, [email or web address] Phone: [phone] Fax: [phone]

## 2017-09-22 ENCOUNTER — Other Ambulatory Visit: Payer: Self-pay | Admitting: *Deleted

## 2017-09-22 NOTE — Patient Outreach (Signed)
[facility]) Care Management  09/22/2017  MERSEDES ALBER [birth year] [id number]   Voice message received from member requesting call back.  Call placed back to member, she expresses concern regarding ablation scheduled for 2/8.  She state she has had friends tell her different things regarding the procedure and what to expect, which has increased her anxiety level.  This care manager educated member on procedure again, reassurance provided of expectations pre & post op. She requested either an elevated toilet seat or bedside commode for after the procedure stating her toilet seat is too low and she fear straining when getting up/down.  She is aware that referral to LCSW has been placed, but also advised to request DME to be ordered through insurance company upon arrival to hospital for procedure.  Advised to notify this care manager if not approved.  Will notify hospital liaisons once member is admitted for surgery, will follow up once discharged.  [name], [place], MSN [place] [phone]

## 2017-09-23 NOTE — Telephone Encounter (Signed)
Did refill that prescription during her previous clinic visit in January 2019.

## 2017-09-26 ENCOUNTER — Telehealth: Payer: Self-pay | Admitting: Internal Medicine

## 2017-09-26 NOTE — Telephone Encounter (Signed)
We did not reach out to the patient.

## 2017-09-26 NOTE — Telephone Encounter (Signed)
New message ° °Pt verbalized that she is calling for the RN °

## 2017-09-26 NOTE — Telephone Encounter (Signed)
Will route to [facility] to see if they called patient.

## 2017-09-26 NOTE — Telephone Encounter (Signed)
Patient stated a pharmacy called her about her medications she is taking. Patient stated they called because she is having surgery on Friday. Will see if our pharmacy called patient.

## 2017-09-26 NOTE — Telephone Encounter (Signed)
Talked with patient - will bring a list of her current medication list with her to ablation on Friday.

## 2017-09-28 ENCOUNTER — Ambulatory Visit (HOSPITAL_COMMUNITY)
Admission: RE | Admit: 2017-09-28 | Discharge: 2017-09-28 | Disposition: A | Discharge status: Home / Self Care | Payer: Medicare Other | Source: Ambulatory Visit | Attending: Internal Medicine | Admitting: Internal Medicine

## 2017-09-28 DIAGNOSIS — K449 Diaphragmatic hernia without obstruction or gangrene: Secondary | ICD-10-CM | POA: Insufficient documentation

## 2017-09-28 DIAGNOSIS — I481 Persistent atrial fibrillation: Secondary | ICD-10-CM | POA: Diagnosis not present

## 2017-09-28 DIAGNOSIS — I4891 Unspecified atrial fibrillation: Secondary | ICD-10-CM | POA: Diagnosis not present

## 2017-09-28 DIAGNOSIS — I4819 Other persistent atrial fibrillation: Secondary | ICD-10-CM

## 2017-09-28 MED ORDER — IOPAMIDOL (ISOVUE-370) INJECTION 76%
INTRAVENOUS | Status: AC
  Administered 2017-09-28: 80 mL via INTRAVENOUS
  Filled 2017-09-28: qty 100

## 2017-09-28 MED ORDER — METOPROLOL TARTRATE 5 MG/5ML IV SOLN
INTRAVENOUS | Status: AC
  Administered 2017-09-28: 5 mg via INTRAVENOUS
  Filled 2017-09-28: qty 10

## 2017-09-28 MED ORDER — METOPROLOL TARTRATE 5 MG/5ML IV SOLN
5.0000 mg | INTRAVENOUS | Status: DC | PRN
  Administered 2017-09-28: 5 mg via INTRAVENOUS

## 2017-09-29 ENCOUNTER — Other Ambulatory Visit: Payer: Self-pay | Admitting: Internal Medicine

## 2017-09-29 NOTE — Telephone Encounter (Signed)
potassium chloride SA (K-DUR,KLOR-CON) 20 MEQ tablet, Refill request @ [facility].

## 2017-09-29 NOTE — Anesthesia Preprocedure Evaluation (Addendum)
Anesthesia Evaluation  Patient identified by MRN, date of birth, ID band Patient awake    Reviewed: Allergy & Precautions, H&P , NPO status , Patient's Chart, lab work & pertinent test results  Airway Mallampati: II  TM Distance: >3 FB Neck ROM: Full    Dental no notable dental hx. (+) Teeth Intact, Dental Advisory Given   Pulmonary asthma , former smoker,    Pulmonary exam normal breath sounds clear to auscultation- rhonchi       Cardiovascular Exercise Tolerance: Good hypertension, Pt. on medications and Pt. on home beta blockers + dysrhythmias Atrial Fibrillation  Rhythm:Regular Rate:Normal     Neuro/Psych negative neurological ROS  negative psych ROS   GI/Hepatic Neg liver ROS, GERD  Medicated and Controlled,  Endo/Other  negative endocrine ROS  Renal/GU negative Renal ROS  negative genitourinary   Musculoskeletal   Abdominal   Peds  Hematology negative hematology ROS (+) anemia ,   Anesthesia Other Findings   Reproductive/Obstetrics negative OB ROS                            Anesthesia Physical Anesthesia Plan  ASA: III  Anesthesia Plan: MAC   Post-op Pain Management:    Induction: Intravenous  PONV Risk Score and Plan: 3 and Propofol infusion, Midazolam and Ondansetron  Airway Management Planned: Simple Face Mask  Additional Equipment:   Intra-op Plan:   Post-operative Plan:   Informed Consent: I have reviewed the patients History and Physical, chart, labs and discussed the procedure including the risks, benefits and alternatives for the proposed anesthesia with the patient or authorized representative who has indicated his/her understanding and acceptance.   Dental advisory given  Plan Discussed with: CRNA  Anesthesia Plan Comments:         Anesthesia Quick Evaluation

## 2017-09-30 ENCOUNTER — Encounter (HOSPITAL_COMMUNITY): Payer: Self-pay | Admitting: Certified Registered Nurse Anesthetist

## 2017-09-30 ENCOUNTER — Ambulatory Visit (HOSPITAL_COMMUNITY): Payer: Medicare Other | Admitting: Anesthesiology

## 2017-09-30 ENCOUNTER — Encounter (HOSPITAL_COMMUNITY)
Admission: RE | Disposition: A | Discharge status: Home / Self Care | Payer: Self-pay | Source: Ambulatory Visit | Attending: Internal Medicine

## 2017-09-30 ENCOUNTER — Ambulatory Visit (HOSPITAL_COMMUNITY)
Admission: RE | Admit: 2017-09-30 | Discharge: 2017-09-30 | Disposition: A | Discharge status: Home / Self Care | Payer: Medicare Other | Source: Ambulatory Visit | Attending: Internal Medicine | Admitting: Internal Medicine

## 2017-09-30 DIAGNOSIS — Z7901 Long term (current) use of anticoagulants: Secondary | ICD-10-CM | POA: Insufficient documentation

## 2017-09-30 DIAGNOSIS — Z7952 Long term (current) use of systemic steroids: Secondary | ICD-10-CM | POA: Diagnosis not present

## 2017-09-30 DIAGNOSIS — I4891 Unspecified atrial fibrillation: Secondary | ICD-10-CM | POA: Diagnosis not present

## 2017-09-30 DIAGNOSIS — J45909 Unspecified asthma, uncomplicated: Secondary | ICD-10-CM | POA: Diagnosis not present

## 2017-09-30 DIAGNOSIS — D649 Anemia, unspecified: Secondary | ICD-10-CM | POA: Insufficient documentation

## 2017-09-30 DIAGNOSIS — Z87891 Personal history of nicotine dependence: Secondary | ICD-10-CM | POA: Insufficient documentation

## 2017-09-30 DIAGNOSIS — I499 Cardiac arrhythmia, unspecified: Secondary | ICD-10-CM | POA: Diagnosis not present

## 2017-09-30 DIAGNOSIS — I480 Paroxysmal atrial fibrillation: Secondary | ICD-10-CM

## 2017-09-30 DIAGNOSIS — I10 Essential (primary) hypertension: Secondary | ICD-10-CM | POA: Diagnosis not present

## 2017-09-30 DIAGNOSIS — K219 Gastro-esophageal reflux disease without esophagitis: Secondary | ICD-10-CM | POA: Diagnosis not present

## 2017-09-30 HISTORY — PX: ATRIAL FIBRILLATION ABLATION: EP1191

## 2017-09-30 LAB — POCT ACTIVATED CLOTTING TIME
ACTIVATED CLOTTING TIME: 318 s
ACTIVATED CLOTTING TIME: 329 s
Activated Clotting Time: 285 seconds
Activated Clotting Time: 208 seconds
Activated Clotting Time: 191 seconds

## 2017-09-30 SURGERY — ATRIAL FIBRILLATION ABLATION
Anesthesia: Monitor Anesthesia Care

## 2017-09-30 MED ORDER — HEPARIN (PORCINE) IN NACL 2-0.9 UNIT/ML-% IJ SOLN
INTRAMUSCULAR | Status: AC | PRN
  Administered 2017-09-30: 500 mL

## 2017-09-30 MED ORDER — HEPARIN (PORCINE) IN NACL 2-0.9 UNIT/ML-% IJ SOLN
INTRAMUSCULAR | Status: AC
  Filled 2017-09-30: qty 500

## 2017-09-30 MED ORDER — ONDANSETRON HCL 4 MG/2ML IJ SOLN: 4.0000 mg | Freq: Four times a day (QID) | INTRAMUSCULAR | Status: DC | PRN

## 2017-09-30 MED ORDER — FLECAINIDE ACETATE 100 MG PO TABS: 100.0000 mg | ORAL_TABLET | Freq: Two times a day (BID) | ORAL | Status: DC

## 2017-09-30 MED ORDER — ISOPROTERENOL HCL 0.2 MG/ML IJ SOLN
INTRAMUSCULAR | Status: DC | PRN
  Administered 2017-09-30: 20 ug/min via INTRAVENOUS

## 2017-09-30 MED ORDER — MIDAZOLAM HCL 5 MG/5ML IJ SOLN
INTRAMUSCULAR | Status: DC | PRN
  Administered 2017-09-30: 2 mg via INTRAVENOUS

## 2017-09-30 MED ORDER — SODIUM CHLORIDE 0.9% FLUSH
3.0000 mL | Freq: Two times a day (BID) | INTRAVENOUS | Status: DC
  Administered 2017-09-30: 3 mL via INTRAVENOUS

## 2017-09-30 MED ORDER — ISOPROTERENOL HCL 0.2 MG/ML IJ SOLN
INTRAMUSCULAR | Status: AC
  Filled 2017-09-30: qty 5

## 2017-09-30 MED ORDER — POTASSIUM CHLORIDE CRYS ER 20 MEQ PO TBCR: 20.0000 meq | EXTENDED_RELEASE_TABLET | Freq: Every day | ORAL | Status: DC

## 2017-09-30 MED ORDER — PROTAMINE SULFATE 10 MG/ML IV SOLN
INTRAVENOUS | Status: DC | PRN
  Administered 2017-09-30: 30 mg via INTRAVENOUS

## 2017-09-30 MED ORDER — BUPIVACAINE HCL (PF) 0.25 % IJ SOLN
INTRAMUSCULAR | Status: AC
  Filled 2017-09-30: qty 30

## 2017-09-30 MED ORDER — PROPOFOL 500 MG/50ML IV EMUL
INTRAVENOUS | Status: DC | PRN
  Administered 2017-09-30: 75 ug/kg/min via INTRAVENOUS

## 2017-09-30 MED ORDER — TIOTROPIUM BROMIDE MONOHYDRATE 18 MCG IN CAPS
18.0000 ug | ORAL_CAPSULE | Freq: Every day | RESPIRATORY_TRACT | Status: DC
  Filled 2017-09-30: qty 5

## 2017-09-30 MED ORDER — FENTANYL CITRATE (PF) 100 MCG/2ML IJ SOLN
INTRAMUSCULAR | Status: DC | PRN
  Administered 2017-09-30 (×4): 25 ug via INTRAVENOUS

## 2017-09-30 MED ORDER — HYDROCODONE-ACETAMINOPHEN 5-325 MG PO TABS: 1.0000 | ORAL_TABLET | ORAL | Status: DC | PRN

## 2017-09-30 MED ORDER — HEPARIN SODIUM (PORCINE) 1000 UNIT/ML IJ SOLN
INTRAMUSCULAR | Status: DC | PRN
  Administered 2017-09-30: 12000 [IU] via INTRAVENOUS

## 2017-09-30 MED ORDER — HEPARIN SODIUM (PORCINE) 1000 UNIT/ML IJ SOLN
INTRAMUSCULAR | Status: DC | PRN
  Administered 2017-09-30: 1000 [IU] via INTRAVENOUS

## 2017-09-30 MED ORDER — SODIUM CHLORIDE 0.9% FLUSH: 3.0000 mL | INTRAVENOUS | Status: DC | PRN

## 2017-09-30 MED ORDER — SODIUM CHLORIDE 0.9 % IV SOLN: 250.0000 mL | INTRAVENOUS | Status: DC | PRN

## 2017-09-30 MED ORDER — IOPAMIDOL (ISOVUE-370) INJECTION 76%
INTRAVENOUS | Status: DC | PRN
Start: 2017-09-30 — End: 2017-09-30
  Administered 2017-09-30: 3 mL via INTRAVENOUS

## 2017-09-30 MED ORDER — BUPIVACAINE HCL (PF) 0.25 % IJ SOLN
INTRAMUSCULAR | Status: DC | PRN
Start: 2017-09-30 — End: 2017-09-30
  Administered 2017-09-30: 20 mL

## 2017-09-30 MED ORDER — ALBUTEROL SULFATE (2.5 MG/3ML) 0.083% IN NEBU: 2.5000 mg | INHALATION_SOLUTION | Freq: Four times a day (QID) | RESPIRATORY_TRACT | Status: DC | PRN

## 2017-09-30 MED ORDER — HEPARIN SODIUM (PORCINE) 1000 UNIT/ML IJ SOLN
INTRAMUSCULAR | Status: DC | PRN
  Administered 2017-09-30: 2000 [IU] via INTRAVENOUS
  Administered 2017-09-30: 3000 [IU] via INTRAVENOUS

## 2017-09-30 MED ORDER — ONDANSETRON HCL 4 MG/2ML IJ SOLN
INTRAMUSCULAR | Status: DC | PRN
  Administered 2017-09-30: 4 mg via INTRAVENOUS

## 2017-09-30 MED ORDER — LORATADINE 10 MG PO TABS
10.0000 mg | ORAL_TABLET | Freq: Every day | ORAL | Status: DC
  Administered 2017-09-30: 10 mg via ORAL
  Filled 2017-09-30: qty 1

## 2017-09-30 MED ORDER — RIVAROXABAN 20 MG PO TABS
20.0000 mg | ORAL_TABLET | Freq: Every day | ORAL | Status: DC
  Administered 2017-09-30: 20 mg via ORAL
  Filled 2017-09-30: qty 1

## 2017-09-30 MED ORDER — LOSARTAN POTASSIUM 50 MG PO TABS
50.0000 mg | ORAL_TABLET | Freq: Every day | ORAL | Status: DC
  Administered 2017-09-30: 50 mg via ORAL
  Filled 2017-09-30: qty 1

## 2017-09-30 MED ORDER — HEPARIN SODIUM (PORCINE) 1000 UNIT/ML IJ SOLN
INTRAMUSCULAR | Status: AC
  Filled 2017-09-30: qty 1

## 2017-09-30 MED ORDER — IOPAMIDOL (ISOVUE-370) INJECTION 76%
INTRAVENOUS | Status: AC
  Filled 2017-09-30: qty 50

## 2017-09-30 MED ORDER — ACETAMINOPHEN 325 MG PO TABS: 650.0000 mg | ORAL_TABLET | ORAL | Status: DC | PRN

## 2017-09-30 MED ORDER — DILTIAZEM HCL ER COATED BEADS 120 MG PO CP24
120.0000 mg | ORAL_CAPSULE | Freq: Every day | ORAL | Status: DC
  Administered 2017-09-30: 120 mg via ORAL
  Filled 2017-09-30: qty 1

## 2017-09-30 MED ORDER — SODIUM CHLORIDE 0.9 % IV SOLN
INTRAVENOUS | Status: DC | PRN
  Administered 2017-09-30: 07:00:00 via INTRAVENOUS

## 2017-09-30 MED ORDER — MAGNESIUM HYDROXIDE 400 MG/5ML PO SUSP
30.0000 mL | Freq: Every day | ORAL | Status: DC | PRN
  Administered 2017-09-30: 30 mL via ORAL
  Filled 2017-09-30: qty 30

## 2017-09-30 SURGICAL SUPPLY — 19 items
BLANKET WARM UNDERBOD FULL ACC (MISCELLANEOUS) ×3 IMPLANT
CATH MAPPNG PENTARAY F 2-6-2MM (CATHETERS) IMPLANT
CATH NAVISTAR SMARTTOUCH DF (ABLATOR) ×2 IMPLANT
CATH SMTCH THERMOCOOL SF DF (CATHETERS) IMPLANT
CATH SOUNDSTAR 3D IMAGING (CATHETERS) ×2 IMPLANT
CATH WEBSTER BI DIR CS D-F CRV (CATHETERS) ×2 IMPLANT
COVER SWIFTLINK CONNECTOR (BAG) ×3 IMPLANT
NDL TRANSEP BRK 71CM 407200 (NEEDLE) IMPLANT
NEEDLE TRANSEP BRK 71CM 407200 (NEEDLE) ×3 IMPLANT
PACK EP LATEX FREE (CUSTOM PROCEDURE TRAY) ×3
PACK EP LF (CUSTOM PROCEDURE TRAY) ×1 IMPLANT
PAD DEFIB LIFELINK (PAD) ×3 IMPLANT
PATCH CARTO3 (PAD) ×2 IMPLANT
PENTARAY F 2-6-2MM (CATHETERS) ×3
SHEATH AVANTI 11F 11CM (SHEATH) ×2 IMPLANT
SHEATH PINNACLE 7F 10CM (SHEATH) ×4 IMPLANT
SHEATH PINNACLE 9F 10CM (SHEATH) ×2 IMPLANT
SHEATH SWARTZ TS SL2 63CM 8.5F (SHEATH) ×2 IMPLANT
TUBING SMART ABLATE COOLFLOW (TUBING) ×2 IMPLANT

## 2017-09-30 NOTE — Discharge Summary (Signed)
ELECTROPHYSIOLOGY PROCEDURE DISCHARGE SUMMARY    Patient ID: [name],  MRN: [id number], DOB/AGE: 69-Jul-1952 69 y.o.  Admit date: 09/30/2017 Discharge date: 09/30/2017   Electrophysiologist: Thompson Grayer, MD  Primary Discharge Diagnosis:  Paroxysmal atrial fibrillation  Secondary Discharge Diagnosis:  hypertension  Procedures This Admission:  1.  Electrophysiology study and radiofrequency catheter ablation on 09/30/17 by Dr Thompson Grayer.  This study demonstrated successful PVI.    Brief HPI: [name] is a 68 y.o. female with a history of paroxysmal atrial fibrillation.  They have failed medical therapy with flecainide. Risks, benefits, and alternatives to catheter ablation of atrial fibrillation were reviewed with the patient who wished to proceed.  The patient underwent TEE prior to the procedure which demonstrated normal LV function and no LAA thrombus.    Hospital Course:  The patient was admitted and underwent EPS/RFCA of atrial fibrillation with details as outlined above.  They were monitored on telemetry which demonstrated sinus rhythm.  Groin was without complication on the day of discharge.  The patient was examined and considered to be stable for discharge.  Wound care and restrictions were reviewed with the patient.  The patient will be seen back by Roderic Palau, NP in 4 weeks and Dr Rayann Heman in 12 weeks for post ablation follow up.   This patients CHA2DS2-VASc Score and unadjusted Ischemic Stroke Rate (% per year) is equal to 3.2 % stroke rate/year from a score of 3 Above score calculated as 1 point each if present [CHF, HTN, DM, Vascular=MI/PAD/Aortic Plaque, Age if 65-74, or Female] Above score calculated as 2 points each if present [Age > 75, or Stroke/TIA/TE]   Physical Exam: Vitals:   09/30/17 1235 09/30/17 1250 09/30/17 1320 09/30/17 1352  BP: (!) 149/72 139/73 135/72 (!) 152/89  Pulse: 62 62 60 64  Resp: 20 14 13 17   Temp:    97.8 F (36.6 C)    TempSrc:    Oral  SpO2: 96% 96% 95% 96%  Weight:      Height:        GEN- The patient is well appearing, alert and oriented x 3 today.   HEENT: normocephalic, atraumatic; sclera clear, conjunctiva pink; hearing intact; oropharynx clear; neck supple  Lungs- Clear to ausculation bilaterally, normal work of breathing.  No wheezes, rales, rhonchi Heart- Regular rate and rhythm, no murmurs, rubs or gallops  GI- soft, non-tender, non-distended, bowel sounds present  Extremities- no clubbing, cyanosis, or edema; DP/PT/radial pulses 2+ bilaterally, groin without hematoma/bruit MS- no significant deformity or atrophy Skin- warm and dry, no rash or lesion Psych- euthymic mood, full affect Neuro- strength and sensation are intact   Labs:   Lab Results  Component Value Date   WBC 3.5 09/16/2017   HGB 12.4 09/16/2017   HCT 36.4 09/16/2017   MCV 80 09/16/2017   PLT 281 09/16/2017   No results for input(s): NA, K, CL, CO2, BUN, CREATININE, CALCIUM, PROT, BILITOT, ALKPHOS, ALT, AST, GLUCOSE in the last 168 hours.  Invalid input(s): LABALBU   Discharge Medications:  Allergies as of 09/30/2017      Reactions   Albuterol    Caused afib    Protonix [pantoprazole Sodium] Other (See Comments)   Heart races   Celecoxib Rash      Medication List    TAKE these medications   albuterol (2.5 MG/3ML) 0.083% nebulizer solution Commonly known as:  PROVENTIL Take 3 mLs (2.5 mg total) by nebulization every 6 (six) hours as  needed for wheezing or shortness of breath.   CARTIA XT 120 MG 24 hr capsule Generic drug:  diltiazem TAKE 1 CAPSULE(120 MG) BY MOUTH DAILY What changed:  See the new instructions.   cetirizine 10 MG tablet Commonly known as:  ZYRTEC Take 10 mg by mouth daily as needed for allergies.   diltiazem 60 MG tablet Commonly known as:  CARDIZEM TAKE 1 TABLET BY MOUTH EVERY 6 HOURS AS NEEDED FOR RAPID AFIB HEART RATE OVER 100   flecainide 100 MG tablet Commonly known as:   TAMBOCOR Take 1 tablet (100 mg total) by mouth 2 (two) times daily.   fluticasone 44 MCG/ACT inhaler Commonly known as:  FLOVENT HFA Inhale 1 puff into the lungs 2 (two) times daily.   fluticasone 50 MCG/ACT nasal spray Commonly known as:  FLONASE Place 1 spray into both nostrils daily as needed for allergies or rhinitis.   hydrocortisone cream 1 % Apply 1 application topically daily as needed for itching.   hydrocortisone 2.5 % cream Apply 1 application topically 2 (two) times daily as needed (eczema).   losartan 50 MG tablet Commonly known as:  COZAAR Take 1 tablet (50 mg total) by mouth daily.   metoprolol tartrate 50 MG tablet Commonly known as:  LOPRESSOR Take 1 tablet by mouth at 11am on 2/6 prior to CT   montelukast 10 MG tablet Commonly known as:  SINGULAIR Take 1 tablet (10 mg total) by mouth at bedtime. What changed:  when to take this   MULTI-VITAMIN PO Take 1 tablet by mouth daily.   OVER THE COUNTER MEDICATION Take 4 oz by mouth daily as needed (PRUNE JUICE AS NEEDED FOR CONSTIPATION RELIEF).   polyethylene glycol powder powder Commonly known as:  GLYCOLAX/MIRALAX Mix 17g of Miralax in 8 ounces of water every other day What changed:    how to take this  when to take this  additional instructions   potassium chloride SA 20 MEQ tablet Commonly known as:  K-DUR,KLOR-CON Take 20 mEq by mouth 2 (two) times daily.   potassium chloride 10 MEQ tablet Commonly known as:  K-DUR,KLOR-CON Take 2 tablets (20 mEq total) by mouth daily.   ranitidine 150 MG tablet Commonly known as:  ZANTAC TAKE 1 TABLET BY MOUTH TWICE DAILY What changed:    how much to take  how to take this  when to take this   rivaroxaban 20 MG Tabs tablet Commonly known as:  XARELTO Take 1 tablet (20 mg total) by mouth daily with supper.   tiotropium 18 MCG inhalation capsule Commonly known as:  SPIRIVA Place 1 capsule (18 mcg total) into inhaler and inhale daily.     triamcinolone 0.1 % cream : eucerin Crea Apply 1 application topically 2 (two) times daily as needed (eczema).       Disposition:   Follow-up Information    [facility] Follow up on 10/27/2017.   Specialty:  Cardiology Why:  10:30AM Contact information: [address] [id number] [place] [phone]       Thompson Grayer, MD Follow up on 01/02/2018.   Specialty:  Cardiology Why:  10:45AM Contact information: [place] [address] [phone]           Duration of Discharge Encounter: Greater than 30 minutes including physician time.  Army Fossa MD 09/30/2017 4:19 PM

## 2017-09-30 NOTE — Anesthesia Postprocedure Evaluation (Signed)
Anesthesia Post Note  Patient: [name]  Procedure(s) Performed: ATRIAL FIBRILLATION ABLATION (N/A )     Patient location during evaluation: PACU Anesthesia Type: MAC Level of consciousness: awake and alert Pain management: pain level controlled Vital Signs Assessment: post-procedure vital signs reviewed and stable Respiratory status: spontaneous breathing, nonlabored ventilation and respiratory function stable Cardiovascular status: stable and blood pressure returned to baseline Postop Assessment: no apparent nausea or vomiting Anesthetic complications: no    Last Vitals:  Vitals:   09/30/17 1055 09/30/17 1100  BP: (!) 176/89 (!) 156/94  Pulse: (!) 57 (!) 58  Resp: 17 19  Temp:    SpO2: 99% 100%    Last Pain:  Vitals:   09/30/17 0950  TempSrc: Temporal                 [name],W. EDMOND

## 2017-09-30 NOTE — Transfer of Care (Signed)
Immediate Anesthesia Transfer of Care Note  Patient: [name]  Procedure(s) Performed: ATRIAL FIBRILLATION ABLATION (N/A )  Patient Location: PACU and Cath Lab  Anesthesia Type:MAC  Level of Consciousness: awake, alert , oriented and patient cooperative  Airway & Oxygen Therapy: Patient Spontanous Breathing and Patient connected to nasal cannula oxygen  Post-op Assessment: Report given to RN and Post -op Vital signs reviewed and stable  Post vital signs: Reviewed and stable  Last Vitals:  Vitals:   09/30/17 0536  BP: (!) 185/85  Pulse: (!) 59  Temp: 36.5 C  SpO2: 99%    Last Pain:  Vitals:   09/30/17 0536  TempSrc: Oral      Patients Stated Pain Goal: 3 (02/63/78 5885)  Complications: No apparent anesthesia complications

## 2017-09-30 NOTE — H&P (Signed)
CC: afib   History of Present Illness: [name] is a 67 y.o. female who presents today for afib ablation.  She has been diagnosed with atrial fibrillation for over a year.  She reports palpitations and nervousness with her afib.  Episodes seem to be worse when she has asthma flare.  She has had multiple ED visits for her afib.  She has also been seen in the AF clinic.  She has failed medical therapy with flecainide.  She is also on diltiazem and xarelto.  Today, she denies symptoms of chest pain, shortness of breath, orthopnea, PND, lower extremity edema, claudication, dizziness, presyncope, syncope, bleeding, or neurologic sequela. The patient is tolerating medications without difficulties and is otherwise without complaint today.        Past Medical History:  Diagnosis Date  . A-fib ([place])   . Allergy    seasonal  . Asthma   . Chronic anticoagulation   . GERD (gastroesophageal reflux disease)   . Hypertension   . PAF (paroxysmal atrial fibrillation) ([place]) 11/26/2015   April/May 2017: Multiple ED visits for A. Fib.  02/12/16: Improved on diltiazem IV. Cardiology consulted in the ED and discharge patient with diltiazem XR 120 mg once daily with 60 mg by mouth every 6 hours as needed for symptomatic palpitations, flecainide 50 mg twice daily. Referred to the atrial fibrillation clinic.  02/17/16: Seen in the atrial fibrillation clinic by NP Roderic Palau. Normal sin        Past Surgical History:  Procedure Laterality Date  . TONSILLECTOMY    . TUBAL LIGATION             Current Outpatient Medications  Medication Sig Dispense Refill  . acetaminophen (TYLENOL) 500 MG tablet Take 1,000 mg by mouth every 6 (six) hours as needed for headache (pain).    Marland Kitchen albuterol (PROVENTIL) (2.5 MG/3ML) 0.083% nebulizer solution Take 3 mLs (2.5 mg total) by nebulization every 6 (six) hours as needed for wheezing or shortness of breath. 75 mL 12  . CARTIA XT 120 MG 24 hr capsule  TAKE 1 CAPSULE(120 MG) BY MOUTH DAILY (Patient taking differently: TAKE 1 CAPSULE(120 MG) BY MOUTH EVERY EVENING) 90 capsule 3  . diltiazem (CARDIZEM) 60 MG tablet TAKE 1 TABLET BY MOUTH EVERY 6 HOURS AS NEEDED FOR RAPID AFIB HEART RATE OVER 100 (Patient taking differently: TAKE 1 TABLET (60 MG) BY MOUTH EVERY 6 HOURS AS NEEDED FOR RAPID AFIB HEART RATE OVER 100) 45 tablet 2  . flecainide (TAMBOCOR) 100 MG tablet Take 1 tablet (100 mg total) by mouth 2 (two) times daily. 60 tablet 6  . fluticasone (FLOVENT HFA) 44 MCG/ACT inhaler Inhale 1 puff into the lungs 2 (two) times daily. 1 Inhaler 12  . losartan (COZAAR) 50 MG tablet Take 1 tablet (50 mg total) by mouth daily. 30 tablet 5  . polyethylene glycol powder (GLYCOLAX/MIRALAX) powder Mix 17g of Miralax in 8 ounces of water every other day (Patient taking differently: Take by mouth See admin instructions. Mix 17 g. in 8 oz water and drink daily as needed for constipation) 255 g 0  . potassium chloride SA (K-DUR,KLOR-CON) 10 MEQ tablet Take 2 tablets (20 mEq total) by mouth daily. (Patient taking differently: Take 10 mEq by mouth 2 (two) times daily. ) 60 tablet 3  . predniSONE (DELTASONE) 10 MG tablet Take 4 tablets (40 mg total) by mouth daily. 20 tablet 0  . ranitidine (ZANTAC) 150 MG tablet TAKE 1 TABLET BY MOUTH TWICE  DAILY (Patient taking differently: TAKE 1 TABLET (150 MG) BY MOUTH TWICE DAILY AS NEEDED FOR INDIGESTION) 60 tablet 0  . rivaroxaban (XARELTO) 20 MG TABS tablet Take 1 tablet (20 mg total) by mouth daily with supper. 30 tablet 6            Current Facility-Administered Medications  Medication Dose Route Frequency Provider Last Rate Last Dose  . 0.9 %  sodium chloride infusion  500 mL Intravenous Continuous Armbruster, Carlota Raspberry, MD        Allergies:   Protonix [pantoprazole sodium] and Celecoxib   Social History:  The patient  reports that she quit smoking about 22 months ago. she has never used smokeless tobacco. She reports  that she does not drink alcohol or use drugs.   Family History:  The patient's  family history includes Breast cancer in her sister; Cancer in her sister, sister, and sister; Heart attack in her mother; Lung cancer in her brother.    ROS:  Please see the history of present illness.   All other systems are personally reviewed and negative.    PHYSICAL EXAM: Vitals:   09/30/17 0536  BP: (!) 185/85  Pulse: (!) 59  Temp: 97.7 F (36.5 C)  SpO2: 99%   GEN: Well nourished, well developed, in no acute distress  HEENT: normal  Neck: no JVD, carotid bruits, or masses Cardiac: RRR; no murmurs, rubs, or gallops,no edema  Respiratory:  clear to auscultation bilaterally, normal work of breathing GI: soft, nontender, nondistended, + BS MS: no deformity or atrophy  Skin: warm and dry  Neuro:  Strength and sensation are intact Psych: euthymic mood, full affect   Recent Labs: 09/25/2016: Magnesium 2.1 08/08/2017: B Natriuretic Peptide 43.2; BUN 12; Creatinine, Ser 0.81; Hemoglobin 13.6; Platelets 223; Potassium 3.7; Sodium 139  personally reviewed   Lipid Panel  Labs(Brief)          Component Value Date/Time   CHOL 245 (H) 03/17/2016 1015   TRIG 95 03/17/2016 1015   HDL 94 03/17/2016 1015   CHOLHDL 2.6 03/17/2016 1015   CHOLHDL 3.1 03/24/2010 0657   VLDL 11 03/24/2010 0657   LDLCALC 132 (H) 03/17/2016 1015     personally reviewed      Wt Readings from Last 3 Encounters:  08/29/17 167 lb 3.2 oz (75.8 kg)  08/21/17 165 lb (74.8 kg)  08/08/17 164 lb (74.4 kg)    ASSESSMENT AND PLAN:  1.  Paroxysmal atrial fibrillation The patient has symptomatic paroxysmal atrial fibrillation.  This seems to be worsened by her asthma and treatments thereof. Therapeutic strategies for afib including medicine and ablation were discussed in detail with the patient today. Risk, benefits, and alternatives to EP study and radiofrequency ablation for afib were also discussed in detail  today. These risks include but are not limited to stroke, bleeding, vascular damage, tamponade, perforation, damage to the esophagus, lungs, and other structures, pulmonary vein stenosis, worsening renal function, and death. The patient understands these risk and wishes to proceed.  We will therefore proceed with catheter ablation at this time. Cardiac CT results discussed with patient today.  Thompson Grayer MD, [facility] 09/30/2017 7:20 AM

## 2017-09-30 NOTE — Progress Notes (Signed)
Site area: rt groin fv sheaths x3 Site Prior to Removal:  Level 0 Pressure Applied For: 20 minutes Manual:   yes Patient Status During Pull:  stable Post Pull Site:  Level 0 Post Pull Instructions Given:  yes Post Pull Pulses Present: palpable rt dp Dressing Applied:  Gauze and tegaderm Bedrest begins @ 7282 Comments: IV saline locked

## 2017-09-30 NOTE — Discharge Instructions (Signed)
Post procedure care No driving for 4 days No lifting over 5 lbs for 1 week. No sexual activity for 1 week. You may return to work in 1 week. Keep procedure site clean & dry. If you notice increased pain, swelling, bleeding or pus, call/return!  You may shower, but no soaking baths/hot tubs/pools for 1 week.    You have an appointment set up with the [facility].  Multiple studies have shown that being followed by a dedicated atrial fibrillation clinic in addition to the standard care you receive from your other physicians improves health. We believe that enrollment in the atrial fibrillation clinic will allow [name] to better care for you.   The phone number to the [facility] is [phone]. The clinic is staffed Monday through Friday from 8:30am to 5pm.  Parking Directions: The clinic is located in the Heart and Vascular Building connected to [facility]. 1)From [address] turn on to [address] and go to the 3rd entrance  (Heart and Vascular entrance) on the right. 2)Look to the right for Heart &Vascular Parking Garage. 3)A code for the entrance is required please call the clinic to receive this.   4)Take the elevators to the 1st floor. Registration is in the room with the glass walls at the end of the hallway.  If you have any trouble parking or locating the clinic, please dont hesitate to call [phone].

## 2017-09-30 NOTE — Progress Notes (Signed)
Patient unable to void w/bedpan. Uncomfortable. Dr. Rayann Heman notified. Order received to I and O cath

## 2017-10-03 ENCOUNTER — Other Ambulatory Visit: Payer: Self-pay | Admitting: *Deleted

## 2017-10-03 ENCOUNTER — Telehealth: Payer: Self-pay | Admitting: Internal Medicine

## 2017-10-03 NOTE — Telephone Encounter (Signed)
Spoke with pt and made her aware that she can go ahead and start taking her MVI.  Pt appreciative for call.

## 2017-10-03 NOTE — Patient Outreach (Signed)
[facility]) Care Management  10/03/2017  LEE-ANNE FLICKER 1951/04/01 [id number]   Call received from member.  She report her procedure on Friday went well, discharged same day.  State she had no medication changes at discharge.  Report she has not had any bleeding or oozing from insertion site, denies pain or symptoms of atrial fibrillation.  Denies any urgent concerns, home visit scheduled for next week.  [facility] CM Care Plan Problem One     Most Recent Value  [facility] CM Short Term Goal #1   Member will have ablation complete within the next 3 weeks  [facility] CM Short Term Goal #1 Start Date  09/09/17  [facility] CM Short Term Goal #1 Met Date  10/03/17  Interventions for Short Term Goal #1  Educated on cardiac ablation, including complications, pre-op and post op expectations.  Education video provided with expections of ablation.     [name], [place], MSN [place] [phone]

## 2017-10-03 NOTE — Telephone Encounter (Signed)
New Message   Pt c/o medication issue:  1. Name of Medication: Multi Vitamin    2. How are you currently taking this medication (dosage and times per day)? Once a day   3. Are you having a reaction (difficulty breathing--STAT)? none  4. What is your medication issue? Patient is wanting to know when she can resume taking her multi vitamin. Please call to discuss.

## 2017-10-05 ENCOUNTER — Telehealth: Payer: Self-pay | Admitting: Internal Medicine

## 2017-10-05 NOTE — Telephone Encounter (Signed)
Patient called to confirm when she may resume activity. Reviewed AVS with patient:  "Post procedure care No driving for 4 days No lifting over 5 lbs for 1 week. No sexual activity for 1 week. You may return to work in 1 week. Keep procedure site clean & dry. If you notice increased pain, swelling, bleeding or pus, call/return! You may shower, but no soaking baths/hot tubs/pools for 1 week."  Patient's ablation was 2/8. She will resume her activities this Saturday. She was grateful for assistance.

## 2017-10-05 NOTE — Telephone Encounter (Signed)
New message  Pt verbalized that she is calling for the RN  She said that she was told that she can resume her  Activities in a week from 09/30/2017.   Pt want RN to call and explain the week for her.

## 2017-10-11 ENCOUNTER — Other Ambulatory Visit: Payer: Self-pay | Admitting: *Deleted

## 2017-10-11 NOTE — Patient Outreach (Signed)
[facility]) Care Management  10/11/2017  [name] [birth year] [id number]   Voice message received from member yesterday requesting to reschedule Thursday's home visit as she has another MD appointment.  Call placed to member, she state she has been ill since yesterday with fever and flu like symptoms. She has appointment scheduled with primary MD this week, home visit rescheduled tentatively for next week pending decrease in symptoms.  Will call prior to home visit.  Denies any urgent concerns.  [name], [place], MSN [place] [phone]

## 2017-10-12 ENCOUNTER — Emergency Department (HOSPITAL_COMMUNITY)
Admission: EM | Admit: 2017-10-12 | Discharge: 2017-10-12 | Disposition: A | Discharge status: Home / Self Care | Payer: Medicare Other | Attending: Emergency Medicine | Admitting: Emergency Medicine

## 2017-10-12 ENCOUNTER — Emergency Department (HOSPITAL_COMMUNITY): Payer: Medicare Other

## 2017-10-12 ENCOUNTER — Encounter (HOSPITAL_COMMUNITY): Payer: Self-pay | Admitting: *Deleted

## 2017-10-12 ENCOUNTER — Other Ambulatory Visit: Payer: Self-pay

## 2017-10-12 DIAGNOSIS — J4521 Mild intermittent asthma with (acute) exacerbation: Secondary | ICD-10-CM | POA: Insufficient documentation

## 2017-10-12 DIAGNOSIS — J111 Influenza due to unidentified influenza virus with other respiratory manifestations: Secondary | ICD-10-CM

## 2017-10-12 DIAGNOSIS — J40 Bronchitis, not specified as acute or chronic: Secondary | ICD-10-CM

## 2017-10-12 DIAGNOSIS — Z87891 Personal history of nicotine dependence: Secondary | ICD-10-CM | POA: Insufficient documentation

## 2017-10-12 DIAGNOSIS — R05 Cough: Secondary | ICD-10-CM | POA: Diagnosis not present

## 2017-10-12 DIAGNOSIS — Z79899 Other long term (current) drug therapy: Secondary | ICD-10-CM | POA: Diagnosis not present

## 2017-10-12 DIAGNOSIS — I10 Essential (primary) hypertension: Secondary | ICD-10-CM | POA: Diagnosis not present

## 2017-10-12 DIAGNOSIS — R112 Nausea with vomiting, unspecified: Secondary | ICD-10-CM | POA: Insufficient documentation

## 2017-10-12 DIAGNOSIS — R0981 Nasal congestion: Secondary | ICD-10-CM | POA: Diagnosis not present

## 2017-10-12 DIAGNOSIS — R42 Dizziness and giddiness: Secondary | ICD-10-CM | POA: Diagnosis not present

## 2017-10-12 DIAGNOSIS — R51 Headache: Secondary | ICD-10-CM | POA: Diagnosis not present

## 2017-10-12 DIAGNOSIS — R404 Transient alteration of awareness: Secondary | ICD-10-CM | POA: Diagnosis not present

## 2017-10-12 DIAGNOSIS — I4891 Unspecified atrial fibrillation: Secondary | ICD-10-CM | POA: Insufficient documentation

## 2017-10-12 DIAGNOSIS — Z7901 Long term (current) use of anticoagulants: Secondary | ICD-10-CM | POA: Insufficient documentation

## 2017-10-12 LAB — URINALYSIS, ROUTINE W REFLEX MICROSCOPIC
Bilirubin Urine: NEGATIVE
Glucose, UA: NEGATIVE mg/dL
Hgb urine dipstick: NEGATIVE
KETONES UR: 5 mg/dL — AB
Leukocytes, UA: NEGATIVE
NITRITE: NEGATIVE
PH: 7 (ref 5.0–8.0)
Protein, ur: 100 mg/dL — AB
SPECIFIC GRAVITY, URINE: 1.019 (ref 1.005–1.030)

## 2017-10-12 LAB — CBC
HCT: 38.4 % (ref 36.0–46.0)
Hemoglobin: 12.3 g/dL (ref 12.0–15.0)
MCH: 26.4 pg (ref 26.0–34.0)
MCHC: 32 g/dL (ref 30.0–36.0)
MCV: 82.4 fL (ref 78.0–100.0)
PLATELETS: 220 10*3/uL (ref 150–400)
RBC: 4.66 MIL/uL (ref 3.87–5.11)
RDW: 15.7 % — ABNORMAL HIGH (ref 11.5–15.5)
WBC: 3.4 10*3/uL — AB (ref 4.0–10.5)

## 2017-10-12 LAB — COMPREHENSIVE METABOLIC PANEL
ALK PHOS: 86 U/L (ref 38–126)
ALT: 30 U/L (ref 14–54)
AST: 33 U/L (ref 15–41)
Albumin: 3.6 g/dL (ref 3.5–5.0)
Anion gap: 10 (ref 5–15)
BILIRUBIN TOTAL: 0.4 mg/dL (ref 0.3–1.2)
BUN: 9 mg/dL (ref 6–20)
CALCIUM: 9.1 mg/dL (ref 8.9–10.3)
CO2: 25 mmol/L (ref 22–32)
Chloride: 105 mmol/L (ref 101–111)
Creatinine, Ser: 0.79 mg/dL (ref 0.44–1.00)
Glucose, Bld: 128 mg/dL — ABNORMAL HIGH (ref 65–99)
Potassium: 3.5 mmol/L (ref 3.5–5.1)
Sodium: 140 mmol/L (ref 135–145)
TOTAL PROTEIN: 6.8 g/dL (ref 6.5–8.1)

## 2017-10-12 LAB — I-STAT TROPONIN, ED: TROPONIN I, POC: 0 ng/mL (ref 0.00–0.08)

## 2017-10-12 LAB — INFLUENZA PANEL BY PCR (TYPE A & B)
INFLBPCR: NEGATIVE
Influenza A By PCR: POSITIVE — AB

## 2017-10-12 LAB — LIPASE, BLOOD: Lipase: 29 U/L (ref 11–51)

## 2017-10-12 MED ORDER — BENZONATATE 100 MG PO CAPS: 100.0000 mg | ORAL_CAPSULE | Freq: Three times a day (TID) | ORAL | 0 refills | Status: DC | PRN

## 2017-10-12 MED ORDER — DOXYCYCLINE HYCLATE 100 MG PO CAPS: 100.0000 mg | ORAL_CAPSULE | Freq: Two times a day (BID) | ORAL | 0 refills | Status: DC

## 2017-10-12 MED ORDER — MONTELUKAST SODIUM 10 MG PO TABS
10.0000 mg | ORAL_TABLET | Freq: Every day | ORAL | Status: DC
  Administered 2017-10-12: 10 mg via ORAL
  Filled 2017-10-12 (×2): qty 1

## 2017-10-12 MED ORDER — METOCLOPRAMIDE HCL 5 MG/ML IJ SOLN
10.0000 mg | Freq: Once | INTRAMUSCULAR | Status: AC
  Administered 2017-10-12: 10 mg via INTRAVENOUS
  Filled 2017-10-12: qty 2

## 2017-10-12 MED ORDER — OSELTAMIVIR PHOSPHATE 75 MG PO CAPS: 75.0000 mg | ORAL_CAPSULE | Freq: Two times a day (BID) | ORAL | 0 refills | Status: DC

## 2017-10-12 MED ORDER — DIPHENHYDRAMINE HCL 50 MG/ML IJ SOLN
25.0000 mg | Freq: Once | INTRAMUSCULAR | Status: AC
  Administered 2017-10-12: 25 mg via INTRAVENOUS
  Filled 2017-10-12: qty 1

## 2017-10-12 MED ORDER — SODIUM CHLORIDE 0.9 % IV BOLUS (SEPSIS)
1000.0000 mL | Freq: Once | INTRAVENOUS | Status: AC
  Administered 2017-10-12: 1000 mL via INTRAVENOUS

## 2017-10-12 MED ORDER — ONDANSETRON HCL 4 MG/2ML IJ SOLN: 4.0000 mg | Freq: Once | INTRAMUSCULAR | Status: DC

## 2017-10-12 MED ORDER — DIPHENHYDRAMINE HCL 50 MG/ML IJ SOLN: 12.5000 mg | Freq: Once | INTRAMUSCULAR | Status: DC

## 2017-10-12 NOTE — ED Notes (Signed)
Pt stating she has started wheezing and cant breath

## 2017-10-12 NOTE — ED Notes (Signed)
Patient told RN and PA that her last name was different than her actual name. Patient answered to other name on multiple occasions before being asked to show her armband. Patient placed inadvertently in room A6 before being moved back to waiting room until another bed opened up.

## 2017-10-12 NOTE — ED Provider Notes (Addendum)
[facility] EMERGENCY DEPARTMENT Provider Note   CSN: [id number] Arrival date & time: 10/12/17  4098     History   Chief Complaint Chief Complaint  Patient presents with  . Cough  . Emesis    HPI [name] is a 67 y.o. female history of atrial fibrillation on xarelto, hypertension, here presenting with headache, cough, congestion.  Patient states that she has not been feeling well for the last week.  Has some cough and congestion and body aches.  Some nausea vomiting and headaches for the last few days as well.  Patient denies actual fevers.  Patient states that she has been compliant with her medications including her Xarelto.  Patient has been around other people were sick with similar symptoms.  The history is provided by the patient.    Past Medical History:  Diagnosis Date  . A-fib ([place])   . Allergy    seasonal  . Asthma   . Chronic anticoagulation   . GERD (gastroesophageal reflux disease)   . Hypertension   . PAF (paroxysmal atrial fibrillation) ([place]) 11/26/2015   April/May 2017: Multiple ED visits for A. Fib.  02/12/16: Improved on diltiazem IV. Cardiology consulted in the ED and discharge patient with diltiazem XR 120 mg once daily with 60 mg by mouth every 6 hours as needed for symptomatic palpitations, flecainide 50 mg twice daily. Referred to the atrial fibrillation clinic.  02/17/16: Seen in the atrial fibrillation clinic by NP Roderic Palau. Normal sin    Patient Active Problem List   Diagnosis Date Noted  . Paroxysmal atrial fibrillation ([place]) 09/30/2017  . Hyperpigmented skin lesion 09/09/2017  . Allergic contact dermatitis 04/27/2017  . Osteoporosis without current pathological fracture 04/15/2017  . Constipation 10/19/2016  . Normocytic anemia 10/19/2016  . Hyperlipidemia 03/18/2016  . Special screening for malignant neoplasms, colon 03/17/2016  . Eczema 03/17/2016  . Cervical cancer screening 03/17/2016  . Assistance with  transportation 03/17/2016  . Hypertension   . GERD (gastroesophageal reflux disease)   . PAF (paroxysmal atrial fibrillation) ([place]) 11/26/2015  . Chronic anticoagulation 11/26/2015  . Folliculitis 11/91/4782  . Marijuana abuse 06/11/2015  . Asthma, mild intermittent     Past Surgical History:  Procedure Laterality Date  . ATRIAL FIBRILLATION ABLATION N/A 09/30/2017   Procedure: ATRIAL FIBRILLATION ABLATION;  Surgeon: Thompson Grayer, MD;  Location: [facility];  Service: Cardiovascular;  Laterality: N/A;  . TONSILLECTOMY    . TUBAL LIGATION      OB History    No data available       Home Medications    Prior to Admission medications   Medication Sig Start Date End Date Taking? Authorizing Provider  acetaminophen (TYLENOL) 325 MG tablet Take 650 mg by mouth every 6 (six) hours as needed for mild pain.   Yes [provider]  albuterol (PROVENTIL) (2.5 MG/3ML) 0.083% nebulizer solution Take 3 mLs (2.5 mg total) by nebulization every 6 (six) hours as needed for wheezing or shortness of breath. 08/21/17  Yes Ray, Andee Poles, MD  CARTIA XT 120 MG 24 hr capsule TAKE 1 CAPSULE(120 MG) BY MOUTH DAILY Patient taking differently: TAKE 1 CAPSULE(120 MG) BY MOUTH EVERY EVENING 12/17/16  Yes Sherran Needs, NP  cetirizine (ZYRTEC) 10 MG tablet Take 10 mg by mouth daily as needed for allergies.   Yes [provider]  flecainide (TAMBOCOR) 100 MG tablet Take 1 tablet (100 mg total) by mouth 2 (two) times daily. 04/27/17  Yes Lacroce,  Hulen Shouts, MD  fluticasone (FLONASE) 50 MCG/ACT nasal spray Place 1 spray into both nostrils daily as needed for allergies or rhinitis.   Yes [provider]  fluticasone (FLOVENT HFA) 44 MCG/ACT inhaler Inhale 1 puff into the lungs 2 (two) times daily. 08/08/17  Yes Molt, Bethany, DO  losartan (COZAAR) 50 MG tablet Take 1 tablet (50 mg total) by mouth daily. 06/17/17  Yes [name], MD  montelukast (SINGULAIR) 10 MG tablet Take 1  tablet (10 mg total) by mouth at bedtime. Patient taking differently: Take 10 mg by mouth at bedtime as needed (allergies).  09/09/17  Yes [name], MD  Multiple Vitamin (MULTI-VITAMIN PO) Take 1 tablet by mouth daily.   Yes [provider]  OVER THE COUNTER MEDICATION Take 4 oz by mouth daily as needed (PRUNE JUICE AS NEEDED FOR CONSTIPATION RELIEF).   Yes [provider]  polyethylene glycol powder (GLYCOLAX/MIRALAX) powder Mix 17g of Miralax in 8 ounces of water every other day Patient taking differently: Take by mouth See admin instructions. Mix 17 g. in 8 oz water and drink daily as needed for constipation 07/20/17  Yes [name], MD  potassium chloride SA (K-DUR,KLOR-CON) 10 MEQ tablet Take 2 tablets (20 mEq total) by mouth daily. 11/12/16  Yes Sherran Needs, NP  ranitidine (ZANTAC) 150 MG tablet TAKE 1 TABLET BY MOUTH TWICE DAILY Patient taking differently: TAKE 1 TABLET (150 MG) BY MOUTH TWICE DAILY AS NEEDED FOR INDIGESTION 03/16/17  Yes Axel Filler, MD  rivaroxaban (XARELTO) 20 MG TABS tablet Take 1 tablet (20 mg total) by mouth daily with supper. 03/15/17  Yes Sherran Needs, NP  tiotropium (SPIRIVA) 18 MCG inhalation capsule Place 1 capsule (18 mcg total) into inhaler and inhale daily. 09/09/17  Yes [name], MD  benzonatate (TESSALON) 100 MG capsule Take 1 capsule (100 mg total) by mouth 3 (three) times daily as needed for cough. 10/12/17   Drenda Freeze, MD  diltiazem (CARDIZEM) 60 MG tablet TAKE 1 TABLET BY MOUTH EVERY 6 HOURS AS NEEDED FOR RAPID AFIB HEART RATE OVER 100 Patient not taking: Reported on 10/12/2017 06/10/17   Sherran Needs, NP  doxycycline (VIBRAMYCIN) 100 MG capsule Take 1 capsule (100 mg total) by mouth 2 (two) times daily. One po bid x 7 days 10/12/17   Drenda Freeze, MD  metoprolol tartrate (LOPRESSOR) 50 MG tablet Take 1 tablet by mouth at 11am on 2/6 prior to CT Patient not taking: Reported on 10/12/2017 09/20/17    Thompson Grayer, MD  oseltamivir (TAMIFLU) 75 MG capsule Take 1 capsule (75 mg total) by mouth every 12 (twelve) hours. 10/12/17   Drenda Freeze, MD    Family History Family History  Problem Relation Age of Onset  . Heart attack Mother        [name] age  . Breast cancer Sister        Late 79s; now s/p mastectomy   . Lung cancer Brother        x 2  . Cancer Sister   . Cancer Sister   . Cancer Sister   . Colon cancer Neg Hx   . Colon polyps Neg Hx   . Esophageal cancer Neg Hx   . Rectal cancer Neg Hx   . Stomach cancer Neg Hx     Social History Social History   Tobacco Use  . Smoking status: Former Smoker    Last attempt to quit: 10/24/2015    Years  since quitting: 1.9  . Smokeless tobacco: Never Used  Substance Use Topics  . Alcohol use: No  . Drug use: No    Comment: Smoked marijuana in the past.     Allergies   Albuterol; Celecoxib; and Protonix [pantoprazole sodium]   Review of Systems Review of Systems  Respiratory: Positive for cough.   Gastrointestinal: Positive for vomiting.  All other systems reviewed and are negative.    Physical Exam Updated Vital Signs BP (!) 142/94   Pulse 83   Temp 99.1 F (37.3 C) (Oral)   Resp (!) 26   SpO2 96%   Physical Exam  Constitutional: She is oriented to person, place, and time.  Slightly dehydrated   HENT:  Head: Normocephalic.  MM slightly dry   Eyes: Conjunctivae and EOM are normal. Pupils are equal, round, and reactive to light.  Neck: Normal range of motion. Neck supple.  No meningeal signs   Cardiovascular: Normal rate, regular rhythm and normal heart sounds.  Pulmonary/Chest: Effort normal.  Diminished bilaterally, no obvious wheezing   Abdominal: Soft. Bowel sounds are normal. She exhibits no distension. There is no tenderness. There is no guarding.  Musculoskeletal: Normal range of motion. She exhibits no edema.  Neurological: She is alert and oriented to person, place, and time. She displays  normal reflexes. No cranial nerve deficit. Coordination normal.  Skin: Skin is warm.  Psychiatric: She has a normal mood and affect.  Nursing note and vitals reviewed.    ED Treatments / Results  Labs (all labs ordered are listed, but only abnormal results are displayed) Labs Reviewed  COMPREHENSIVE METABOLIC PANEL - Abnormal; Notable for the following components:      Result Value   Glucose, Bld 128 (*)    All other components within normal limits  CBC - Abnormal; Notable for the following components:   WBC 3.4 (*)    RDW 15.7 (*)    All other components within normal limits  INFLUENZA PANEL BY PCR (TYPE A & B) - Abnormal; Notable for the following components:   Influenza A By PCR POSITIVE (*)    All other components within normal limits  URINALYSIS, ROUTINE W REFLEX MICROSCOPIC - Abnormal; Notable for the following components:   APPearance HAZY (*)    Ketones, ur 5 (*)    Protein, ur 100 (*)    Bacteria, UA RARE (*)    Squamous Epithelial / LPF 0-5 (*)    All other components within normal limits  LIPASE, BLOOD  I-STAT TROPONIN, ED    EKG  EKG Interpretation  Date/Time:  Wednesday October 12 2017 12:03:59 EST Ventricular Rate:  77 PR Interval:    QRS Duration: 92 QT Interval:  416 QTC Calculation: 471 R Axis:   63 Text Interpretation:  Sinus rhythm RAE, consider biatrial enlargement RSR' in V1 or V2, probably normal variant Borderline T abnormalities, lateral leads Baseline wander in lead(s) V5 No significant change since last tracing Confirmed by Wandra Arthurs [phone]) on 10/12/2017 12:12:57 PM       Radiology Dg Chest 2 View  Result Date: 10/12/2017 CLINICAL DATA:  Productive cough, fever EXAM: CHEST  2 VIEW COMPARISON:  08/21/2017 FINDINGS: There is hyperinflation of the lungs compatible with COPD. Heart is upper limits normal in size. No confluent airspace opacities or effusions. No acute bony abnormality. IMPRESSION: COPD.  No active disease. Electronically  Signed   By: Rolm Baptise M.D.   On: 10/12/2017 09:22    Procedures Procedures (including  critical care time)  Medications Ordered in ED Medications  montelukast (SINGULAIR) tablet 10 mg (10 mg Oral Given 10/12/17 1446)  sodium chloride 0.9 % bolus 1,000 mL (1,000 mLs Intravenous New Bag/Given 10/12/17 1432)  metoCLOPramide (REGLAN) injection 10 mg (10 mg Intravenous Given 10/12/17 1432)  diphenhydrAMINE (BENADRYL) injection 25 mg (25 mg Intravenous Given 10/12/17 1433)     Initial Impression / Assessment and Plan / ED Course  I have reviewed the triage vital signs and the nursing notes.  Pertinent labs & imaging results that were available during my care of the patient were reviewed by me and considered in my medical decision making (see chart for details).     IDANIA DESOUZA is a 67 y.o. female here with chills, body aches, nausea, vomiting. Abdomen nontender. Consider viral syndrome vs flu. Afebrile, no meningeal signs. Will get labs, flu test, CXR. Will hydrate and give migraine cocktail    3:30 pm Labs unremarkable. CXR showed no obvious pneumonia. Flu sent. Given productive cough, likely early pneumonia vs flu. Will dc home with doxycyline for possible early pneumonia, tamiflu (she will fill prescription if positive). She requested cough medicine.   4:09 PM Flu positive right before discharge. Updated patient and told her to take tamiflu, doxycycline.   Final Clinical Impressions(s) / ED Diagnoses   Final diagnoses:  Bronchitis  Flu syndrome    ED Discharge Orders        Ordered    doxycycline (VIBRAMYCIN) 100 MG capsule  2 times daily     10/12/17 1539    oseltamivir (TAMIFLU) 75 MG capsule  Every 12 hours     10/12/17 1540    benzonatate (TESSALON) 100 MG capsule  3 times daily PRN     10/12/17 1540       Drenda Freeze, MD 10/12/17 1538    Drenda Freeze, MD 10/12/17 [phone]

## 2017-10-12 NOTE — ED Triage Notes (Signed)
Pt arrived by [facility]. Reports not feeling well x 1 week with cough, sore throat, congestion, headache and body pain. Now also having mild n/v/d. Airway intact at triage. Mask on pt.

## 2017-10-12 NOTE — Discharge Instructions (Signed)
Stay hydrated.   Take tylenol for fever and chills   Take doxycycline for bronchitis.   Take tessalon pearl for cough   You have the flu. Take tamiflu as prescribed.    See your doctor  Return to ER if you have worse cough, trouble breathing, vomiting, dehydration

## 2017-10-13 ENCOUNTER — Ambulatory Visit: Payer: Self-pay | Admitting: *Deleted

## 2017-10-13 ENCOUNTER — Ambulatory Visit: Payer: Self-pay

## 2017-10-18 ENCOUNTER — Other Ambulatory Visit: Payer: Self-pay | Admitting: *Deleted

## 2017-10-18 NOTE — Patient Outreach (Signed)
[facility]) Care Management  10/18/2017  [name] [birth year] [id number]   Notified by care management assistant that member is requesting to cancel home visit this week due to not feeling well.  She was diagnosed last week with the flu.  Call placed to member to follow up on current health status and to confirm cancellation, no answer.  HIPAA compliant voice message left.  Will await call back.  If no call back, will follow up within the next 2 weeks.   [name], [place], MSN [place] [phone]

## 2017-10-19 ENCOUNTER — Ambulatory Visit: Payer: Self-pay | Admitting: *Deleted

## 2017-10-20 ENCOUNTER — Telehealth: Payer: Self-pay | Admitting: Internal Medicine

## 2017-10-20 NOTE — Telephone Encounter (Signed)
New Message   Patient is calling in reference to a pain that she has been experiencing in her neck. She does not think its from her surgery but she wants to discuss with a nurse. Please call to discuss.

## 2017-10-20 NOTE — Telephone Encounter (Signed)
Spoke with patient. Patient c/o neck stiffness early in the morning when she first wakes up. Patient stated she was diagnosed with the flu about a week ago. Patient has been taking antibiotics and mucinex. Patient denies chest pain, sob, palpitations or dizziness. Informed patient that symptoms should not be related to her procedure and she should follow up with her primary MD. I also advised pt to try a heat pack. Patient in agreement with plan and thanked me for the call.

## 2017-10-21 ENCOUNTER — Other Ambulatory Visit: Payer: Self-pay

## 2017-10-21 ENCOUNTER — Ambulatory Visit (INDEPENDENT_AMBULATORY_CARE_PROVIDER_SITE_OTHER): Payer: Medicare Other | Admitting: Internal Medicine

## 2017-10-21 DIAGNOSIS — M810 Age-related osteoporosis without current pathological fracture: Secondary | ICD-10-CM

## 2017-10-21 DIAGNOSIS — R51 Headache: Secondary | ICD-10-CM

## 2017-10-21 DIAGNOSIS — L988 Other specified disorders of the skin and subcutaneous tissue: Secondary | ICD-10-CM | POA: Diagnosis not present

## 2017-10-21 DIAGNOSIS — Z7901 Long term (current) use of anticoagulants: Secondary | ICD-10-CM | POA: Diagnosis not present

## 2017-10-21 DIAGNOSIS — J4520 Mild intermittent asthma, uncomplicated: Secondary | ICD-10-CM

## 2017-10-21 DIAGNOSIS — R05 Cough: Secondary | ICD-10-CM

## 2017-10-21 DIAGNOSIS — Z79899 Other long term (current) drug therapy: Secondary | ICD-10-CM

## 2017-10-21 DIAGNOSIS — M542 Cervicalgia: Secondary | ICD-10-CM

## 2017-10-21 DIAGNOSIS — I10 Essential (primary) hypertension: Secondary | ICD-10-CM | POA: Diagnosis not present

## 2017-10-21 DIAGNOSIS — L819 Disorder of pigmentation, unspecified: Secondary | ICD-10-CM

## 2017-10-21 DIAGNOSIS — I480 Paroxysmal atrial fibrillation: Secondary | ICD-10-CM | POA: Diagnosis not present

## 2017-10-21 MED ORDER — DM-GUAIFENESIN ER 30-600 MG PO TB12: 1.0000 | ORAL_TABLET | Freq: Two times a day (BID) | ORAL | 0 refills | Status: DC

## 2017-10-21 MED ORDER — LOSARTAN POTASSIUM 50 MG PO TABS: 50.0000 mg | ORAL_TABLET | Freq: Every day | ORAL | 5 refills | Status: DC

## 2017-10-21 MED ORDER — ASPIRIN-ACETAMINOPHEN-CAFFEINE 250-250-65 MG PO TABS: 2.0000 | ORAL_TABLET | Freq: Four times a day (QID) | ORAL | 0 refills | Status: DC | PRN

## 2017-10-21 MED ORDER — TIOTROPIUM BROMIDE MONOHYDRATE 18 MCG IN CAPS: 18.0000 ug | ORAL_CAPSULE | Freq: Every day | RESPIRATORY_TRACT | 2 refills | Status: DC

## 2017-10-21 MED ORDER — DICLOFENAC SODIUM 1 % TD GEL: 4.0000 g | Freq: Four times a day (QID) | TRANSDERMAL | 0 refills | Status: DC

## 2017-10-21 NOTE — Assessment & Plan Note (Signed)
According to patient she is very sensitive to medication and want to have her A. fib under good control after ablation before thinking about getting a new medicine. She continues to take calcium and vitamin D.  She will need Fosamax once she feels better from her A. fib.

## 2017-10-21 NOTE — Progress Notes (Signed)
   CC: For follow-up of her hypertension and flulike symptoms.  HPI:  [name] is a 67 y.o. with past medical history as listed below came to the clinic for follow-up of her hypertension and recent ED visit due to flu.  She was recently seen in ED on October 12, 2017 and was diagnosed with flu, was given a course of Tamiflu and doxycycline which she completed.  She continued to experience a cough with whitish sputum, no more fever, continue to have headaches and pain around her neck and both shoulders.  She was asking for voltaren gel for her neck pain. She denies any wheezing. She denies any palpitations. She is is experiencing almost daily headaches, mild to moderate, associated with mild photophobia, denies any nausea or vomiting, no aura.  According to patient Tylenol does not work very well for her and ibuprofen can cause worsening of her asthma.  She was asking for some things stronger for her headaches.  She was advised to try Excedrin.  Please see assessment and plan for her chronic conditions.  Past Medical History:  Diagnosis Date  . A-fib ([place])   . Allergy    seasonal  . Asthma   . Chronic anticoagulation   . GERD (gastroesophageal reflux disease)   . Hypertension   . PAF (paroxysmal atrial fibrillation) ([place]) 11/26/2015   April/May 2017: Multiple ED visits for A. Fib.  02/12/16: Improved on diltiazem IV. Cardiology consulted in the ED and discharge patient with diltiazem XR 120 mg once daily with 60 mg by mouth every 6 hours as needed for symptomatic palpitations, flecainide 50 mg twice daily. Referred to the atrial fibrillation clinic.  02/17/16: Seen in the atrial fibrillation clinic by NP Roderic Palau. Normal sin   Review of Systems: Negative except mentioned in HPI.  Physical Exam:  Vitals:   10/21/17 1403  BP: 135/80  Pulse: 73  Temp: 97.8 F (36.6 C)  TempSrc: Oral  SpO2: 100%  Weight: 163 lb 6.4 oz (74.1 kg)  Height: 5\' 7"  (1.702 m)    General:  Vital signs reviewed.  Patient is well-developed and well-nourished, in no acute distress and cooperative with exam.  Head: Normocephalic and atraumatic. Eyes: EOMI, conjunctivae normal, no scleral icterus.  Cardiovascular: RRR, S1 normal, S2 normal, no murmurs, gallops, or rubs. Pulmonary/Chest: Clear to auscultation bilaterally, no wheezes, rales, or rhonchi. Abdominal: Soft, non-tender, non-distended, BS +, no masses, organomegaly, or guarding present.  Musculoskeletal: No joint deformities, erythema, or stiffness, ROM full and nontender. Extremities: No lower extremity edema bilaterally,  pulses symmetric and intact bilaterally. No cyanosis or clubbing. Skin: Warm, dry and intact. No rashes or erythema. Psychiatric: Normal mood and affect. speech and behavior is normal. Cognition and memory are normal.  Assessment & Plan:   See Encounters Tab for problem based charting.  Patient discussed with Dr. Rebeca Alert.

## 2017-10-21 NOTE — Assessment & Plan Note (Signed)
BP Readings from Last 3 Encounters:  10/21/17 135/80  10/12/17 (!) 174/98  09/30/17 (!) 152/89   Her blood pressure was mildly elevated.  She is on losartan 50 mg daily along with metoprolol, diltiazem.  Continue current management.

## 2017-10-21 NOTE — Assessment & Plan Note (Addendum)
No exacerbation at this time. Chest was clear.  No wheezing. She continued to have cough with whitish sputum after being diagnosed with flu 10 days ago.  Continue with Spiriva, Singulair and albuterol. Prescription for Mucinex DM was provided.

## 2017-10-21 NOTE — Assessment & Plan Note (Signed)
She had an ablation procedure done on September 30, 2017.  She tolerated the procedure very well.  She denies any more episodes of A. fib. She continued to take her metoprolol, diltiazem, flecainide and Xarelto. Her next appointment with cardiology is on October 27, 2017.  She was encouraged to keep her appointment with cardiology and we will let them monitor her antiarrhythmics needs.

## 2017-10-21 NOTE — Assessment & Plan Note (Signed)
She canceled her appointment with dermatology, stating that a lot is going on at this time and she will reschedule her appointment soon.

## 2017-10-21 NOTE — Patient Instructions (Addendum)
Thank you for visiting clinic today. I am glad that your asthma and A. fib is now well controlled. Please keep your appointment with cardiology. As we discussed your flu symptoms going to persist for a few weeks.  Please keep yourself well-hydrated and well rested. Before your headaches you can try Excedrin. As you requested I am giving you a prescription of Voltaren gel to use for your neck pain and see if it works. Please follow-up in 3-month or earlier if needed.

## 2017-10-21 NOTE — Progress Notes (Signed)
Internal Medicine Clinic Attending  Case discussed with Dr. Amin  at the time of the visit.  We reviewed the resident's history and exam and pertinent patient test results.  I agree with the assessment, diagnosis, and plan of care documented in the resident's note.  Alexander N Raines, MD   

## 2017-10-24 ENCOUNTER — Telehealth: Payer: Self-pay | Admitting: Internal Medicine

## 2017-10-24 NOTE — Telephone Encounter (Signed)
Pt is calling back about diclofenac sodium (VOLTAREN) 1 % GEL. States the pharmacy is still  Waiting for the clinic to reply.

## 2017-10-24 NOTE — Telephone Encounter (Signed)
Pt unable to get voltaren gel-needs prior authorization.Regenia Skeeter, Adreana Coull Cassady3/4/20199:10 AM

## 2017-10-24 NOTE — Telephone Encounter (Signed)
Patient is saying that the prescription that was given Friday, the medicaid will not cover it

## 2017-10-24 NOTE — Telephone Encounter (Signed)
Called pt - no answer. [name] is working on [employer].

## 2017-10-24 NOTE — Telephone Encounter (Signed)
[employer], patient and pharmacy for a pre-authorization

## 2017-10-24 NOTE — Telephone Encounter (Addendum)
Information was sent through CoverMyMeds for [place] for Diclofenac Gel 1%.  Will await answer within 72 hours.  Sander Nephew, RN 10/24/2017 10:13 AM.  Diclofenac Gel was Prior Authorized starting 10/24/2017 thru 08/22/2018.  [facility] on [address] was called and notified of.  Sander Nephew, RN 10/24/2017 1:18 PM.

## 2017-10-25 ENCOUNTER — Telehealth: Payer: Self-pay | Admitting: Internal Medicine

## 2017-10-25 ENCOUNTER — Other Ambulatory Visit (HOSPITAL_COMMUNITY): Payer: Self-pay | Admitting: *Deleted

## 2017-10-25 MED ORDER — RIVAROXABAN 20 MG PO TABS: 20.0000 mg | ORAL_TABLET | Freq: Every day | ORAL | 6 refills | Status: DC

## 2017-10-25 NOTE — Telephone Encounter (Signed)
Pt states she have the flu, requesting med for flu and cough. Offered appt to come in, but states she do not need one. Please call pt back.

## 2017-10-25 NOTE — Telephone Encounter (Signed)
Called pt - no answer; left message to give us a call back. 

## 2017-10-25 NOTE — Telephone Encounter (Signed)
Pt stated she's taking Mucinex DM - started yesterday. Stated antibiotics help before (when she went to the ED) - congestion/pain mainly nasal, "blowing my nose a lot". Did not want to schedule an appt ; "I was just there.". Please advise  Thanks

## 2017-10-25 NOTE — Telephone Encounter (Signed)
Patient is calling back to see if nurse talked with the physician yet

## 2017-10-25 NOTE — Telephone Encounter (Signed)
Patient needs some more antibodies, pt said she have the flu

## 2017-10-25 NOTE — Telephone Encounter (Signed)
She was flu positive in the ED was given a course of Tamiflu and doxycycline which she has already completed. During her previous appointment on Friday I explained to her that this cough is going to last for some time and she does not need anything more than simple Mucinex and supportive measures currently.

## 2017-10-26 NOTE — Telephone Encounter (Signed)
The patient is calling check to see if the physician was able to call her in some cough medicine

## 2017-10-26 NOTE — Telephone Encounter (Signed)
Pt is calling back requesting the nurse to call something in for sinus. States if the med is called into the pharmacy, she will cancel the appt for her on Friday. Please call pt back.

## 2017-10-26 NOTE — Telephone Encounter (Signed)
Returned patient's call. Relayed info from PCP that pt has completed course of tamiflu and antibiotic and does not need any additional meds. Discussed that cough following virus can last several weeks. Encouraged to drink plenty of fluids especially water to keep secretions thin. Patient states understanding and expressed appreciation for call. Hubbard Hartshorn, RN, BSN

## 2017-10-26 NOTE — Telephone Encounter (Signed)
Patient is calling back, she is wanting to talk to the nurse

## 2017-10-26 NOTE — Telephone Encounter (Signed)
Patient is requesting to speak with a nurse. Please call pt back.

## 2017-10-27 ENCOUNTER — Encounter (HOSPITAL_COMMUNITY): Payer: Self-pay | Admitting: Nurse Practitioner

## 2017-10-27 ENCOUNTER — Ambulatory Visit (HOSPITAL_COMMUNITY)
Admission: RE | Admit: 2017-10-27 | Discharge: 2017-10-27 | Disposition: A | Discharge status: Home / Self Care | Payer: Medicare Other | Source: Ambulatory Visit | Attending: Nurse Practitioner | Admitting: Nurse Practitioner

## 2017-10-27 VITALS — BP 158/88 | HR 68 | Ht 67.0 in | Wt 164.0 lb

## 2017-10-27 DIAGNOSIS — Z79899 Other long term (current) drug therapy: Secondary | ICD-10-CM | POA: Insufficient documentation

## 2017-10-27 DIAGNOSIS — K219 Gastro-esophageal reflux disease without esophagitis: Secondary | ICD-10-CM | POA: Insufficient documentation

## 2017-10-27 DIAGNOSIS — Z9851 Tubal ligation status: Secondary | ICD-10-CM | POA: Insufficient documentation

## 2017-10-27 DIAGNOSIS — I480 Paroxysmal atrial fibrillation: Secondary | ICD-10-CM | POA: Diagnosis not present

## 2017-10-27 DIAGNOSIS — Z7902 Long term (current) use of antithrombotics/antiplatelets: Secondary | ICD-10-CM | POA: Diagnosis not present

## 2017-10-27 DIAGNOSIS — Z888 Allergy status to other drugs, medicaments and biological substances status: Secondary | ICD-10-CM | POA: Diagnosis not present

## 2017-10-27 DIAGNOSIS — Z8249 Family history of ischemic heart disease and other diseases of the circulatory system: Secondary | ICD-10-CM | POA: Diagnosis not present

## 2017-10-27 DIAGNOSIS — Z809 Family history of malignant neoplasm, unspecified: Secondary | ICD-10-CM | POA: Insufficient documentation

## 2017-10-27 DIAGNOSIS — J45909 Unspecified asthma, uncomplicated: Secondary | ICD-10-CM | POA: Diagnosis not present

## 2017-10-27 DIAGNOSIS — Z803 Family history of malignant neoplasm of breast: Secondary | ICD-10-CM | POA: Diagnosis not present

## 2017-10-27 DIAGNOSIS — Z87891 Personal history of nicotine dependence: Secondary | ICD-10-CM | POA: Diagnosis not present

## 2017-10-27 DIAGNOSIS — Z9889 Other specified postprocedural states: Secondary | ICD-10-CM | POA: Insufficient documentation

## 2017-10-27 DIAGNOSIS — I10 Essential (primary) hypertension: Secondary | ICD-10-CM | POA: Diagnosis not present

## 2017-10-27 DIAGNOSIS — Z801 Family history of malignant neoplasm of trachea, bronchus and lung: Secondary | ICD-10-CM | POA: Insufficient documentation

## 2017-10-27 MED ORDER — FLECAINIDE ACETATE 100 MG PO TABS: 100.0000 mg | ORAL_TABLET | Freq: Two times a day (BID) | ORAL | 6 refills | Status: DC

## 2017-10-27 NOTE — Progress Notes (Signed)
Primary Care Physician: Shelby Nimrod, MD Referring Physician: Dr. Rosine Bartlett is a 67 y.o. female with a h/o paroxysmal afib that had an afib ablation 4 weeks ago. She has not noted any afib. Continues on flecainide and xarelto. No swallowing or groin issues. She was dx with the flu 2/20 and seems to have recovered other than some sinus congestion.  Today, she denies symptoms of palpitations, chest pain, shortness of breath, orthopnea, PND, lower extremity edema, dizziness, presyncope, syncope, or neurologic sequela. The patient is tolerating medications without difficulties and is otherwise without complaint today.   Past Medical History:  Diagnosis Date  . A-fib ([place])   . Allergy    seasonal  . Asthma   . Chronic anticoagulation   . GERD (gastroesophageal reflux disease)   . Hypertension   . PAF (paroxysmal atrial fibrillation) ([place]) 11/26/2015   April/May 2017: Multiple ED visits for A. Fib.  02/12/16: Improved on diltiazem IV. Cardiology consulted in the ED and discharge patient with diltiazem XR 120 mg once daily with 60 mg by mouth every 6 hours as needed for symptomatic palpitations, flecainide 50 mg twice daily. Referred to the atrial fibrillation clinic.  02/17/16: Seen in the atrial fibrillation clinic by NP [name]. Normal sin   Past Surgical History:  Procedure Laterality Date  . ATRIAL FIBRILLATION ABLATION N/A 09/30/2017   Procedure: ATRIAL FIBRILLATION ABLATION;  Surgeon: Shelby Grayer, MD;  Location: [facility];  Service: Cardiovascular;  Laterality: N/A;  . TONSILLECTOMY    . TUBAL LIGATION      Current Outpatient Medications  Medication Sig Dispense Refill  . acetaminophen (TYLENOL) 325 MG tablet Take 650 mg by mouth every 6 (six) hours as needed for mild pain.    Marland Kitchen albuterol (PROVENTIL) (2.5 MG/3ML) 0.083% nebulizer solution Take 3 mLs (2.5 mg total) by nebulization every 6 (six) hours as needed for wheezing or shortness of breath. 75 mL 12    . CARTIA XT 120 MG 24 hr capsule TAKE 1 CAPSULE(120 MG) BY MOUTH DAILY (Patient taking differently: TAKE 1 CAPSULE(120 MG) BY MOUTH EVERY EVENING) 90 capsule 3  . dextromethorphan-guaiFENesin (MUCINEX DM) 30-600 MG 12hr tablet Take 1 tablet by mouth 2 (two) times daily. 30 tablet 0  . diclofenac sodium (VOLTAREN) 1 % GEL Apply 4 g topically 4 (four) times daily. 100 g 0  . diltiazem (CARDIZEM) 60 MG tablet TAKE 1 TABLET BY MOUTH EVERY 6 HOURS AS NEEDED FOR RAPID AFIB HEART RATE OVER 100 45 tablet 2  . flecainide (TAMBOCOR) 100 MG tablet Take 1 tablet (100 mg total) by mouth 2 (two) times daily. 60 tablet 6  . fluticasone (FLONASE) 50 MCG/ACT nasal spray Place 1 spray into both nostrils daily as needed for allergies or rhinitis.    . fluticasone (FLOVENT HFA) 44 MCG/ACT inhaler Inhale 1 puff into the lungs 2 (two) times daily. 1 Inhaler 12  . losartan (COZAAR) 50 MG tablet Take 1 tablet (50 mg total) by mouth daily. 30 tablet 5  . montelukast (SINGULAIR) 10 MG tablet Take 1 tablet (10 mg total) by mouth at bedtime. (Patient taking differently: Take 10 mg by mouth at bedtime as needed (allergies). ) 90 tablet 1  . Multiple Vitamin (MULTI-VITAMIN PO) Take 1 tablet by mouth daily.    Marland Kitchen OVER THE COUNTER MEDICATION Take 4 oz by mouth daily as needed (PRUNE JUICE AS NEEDED FOR CONSTIPATION RELIEF).    . polyethylene glycol powder (GLYCOLAX/MIRALAX) powder Mix 17g  of Miralax in 8 ounces of water every other day (Patient taking differently: Take by mouth See admin instructions. Mix 17 g. in 8 oz water and drink daily as needed for constipation) 255 g 0  . potassium chloride SA (K-DUR,KLOR-CON) 10 MEQ tablet Take 2 tablets (20 mEq total) by mouth daily. 60 tablet 3  . ranitidine (ZANTAC) 150 MG tablet TAKE 1 TABLET BY MOUTH TWICE DAILY (Patient taking differently: TAKE 1 TABLET (150 MG) BY MOUTH TWICE DAILY AS NEEDED FOR INDIGESTION) 60 tablet 0  . rivaroxaban (XARELTO) 20 MG TABS tablet Take 1 tablet (20 mg  total) by mouth daily with supper. 30 tablet 6  . tiotropium (SPIRIVA) 18 MCG inhalation capsule Place 1 capsule (18 mcg total) into inhaler and inhale daily. 30 capsule 2   No current facility-administered medications for this encounter.     Allergies  Allergen Reactions  . Albuterol Other (See Comments)    Caused afib   . Celecoxib Rash  . Protonix [Pantoprazole Sodium] Palpitations    Social History   Socioeconomic History  . Marital status: Single    Spouse name: Not on file  . Number of children: Not on file  . Years of education: Not on file  . Highest education level: Not on file  Social Needs  . Financial resource strain: Not on file  . Food insecurity - worry: Not on file  . Food insecurity - inability: Not on file  . Transportation needs - medical: Not on file  . Transportation needs - non-medical: Not on file  Occupational History  . Occupation: Retired  Tobacco Use  . Smoking status: Former Smoker    Last attempt to quit: 10/24/2015    Years since quitting: 2.0  . Smokeless tobacco: Never Used  Substance and Sexual Activity  . Alcohol use: No  . Drug use: No    Comment: Smoked marijuana in the past.  . Sexual activity: Yes    Partners: Male    Birth control/protection: Post-menopausal  Other Topics Concern  . Not on file  Social History Narrative   Lives alone in [place], [place] and has 2 daughters who are within driving distance   Enjoys watching soap operas, reading, exercise    Family History  Problem Relation Age of Onset  . Heart attack Mother        [name] age  . Breast cancer Sister        Late 54s; now s/p mastectomy   . Lung cancer Brother        x 2  . Cancer Sister   . Cancer Sister   . Cancer Sister   . Colon cancer Neg Hx   . Colon polyps Neg Hx   . Esophageal cancer Neg Hx   . Rectal cancer Neg Hx   . Stomach cancer Neg Hx     ROS- All systems are reviewed and negative except as per the HPI above  Physical Exam: Vitals:    10/27/17 1018  BP: (!) 158/88  Pulse: 68  Weight: 164 lb (74.4 kg)  Height: 5\' 7"  (1.702 m)   Wt Readings from Last 3 Encounters:  10/27/17 164 lb (74.4 kg)  10/21/17 163 lb 6.4 oz (74.1 kg)  09/30/17 165 lb (74.8 kg)    Labs: Lab Results  Component Value Date   NA 140 10/12/2017   K 3.5 10/12/2017   CL 105 10/12/2017   CO2 25 10/12/2017   GLUCOSE 128 (H) 10/12/2017   BUN 9  10/12/2017   CREATININE 0.79 10/12/2017   CALCIUM 9.1 10/12/2017   PHOS 3.0 04/15/2017   MG 2.1 09/25/2016   Lab Results  Component Value Date   INR 1.03 10/06/2016   Lab Results  Component Value Date   CHOL 245 (H) 03/17/2016   HDL 94 03/17/2016   LDLCALC 132 (H) 03/17/2016   TRIG 95 03/17/2016     GEN- The patient is well appearing, alert and oriented x 3 today.   Head- normocephalic, atraumatic Eyes-  Sclera clear, conjunctiva pink Ears- hearing intact Oropharynx- clear Neck- supple, no JVP Lymph- no cervical lymphadenopathy Lungs- Clear to ausculation bilaterally, normal work of breathing Heart- Regular rate and rhythm, no murmurs, rubs or gallops, PMI not laterally displaced GI- soft, NT, ND, + BS Extremities- no clubbing, cyanosis, or edema MS- no significant deformity or atrophy Skin- no rash or lesion Psych- euthymic mood, full affect Neuro- strength and sensation are intact  EKG-NSR at 68 bpm, pr int 176 ms, qrs int 100 ms, qtc 455 ms Epic records reviewed    Assessment and Plan: 1. Paroxysmal afib  S/p ablation 09/30/17 Maintaining SR Continue  flecainide 100 mg bid Continue Cartia XT 120 mg qd Continue xarelto for chadsvasc score of 3  F/u Dr. Rayann Heman 5/13 as scheduled  Geroge Baseman. Ashayla Subia, [facility] [address] [place], [place] [address] [phone]

## 2017-10-27 NOTE — Telephone Encounter (Signed)
She can continue using Flonase and saline nasal spray. She can also try a Nettie pot for sinus wash.

## 2017-10-28 ENCOUNTER — Encounter: Payer: Self-pay | Admitting: Internal Medicine

## 2017-10-28 ENCOUNTER — Other Ambulatory Visit: Payer: Self-pay | Admitting: *Deleted

## 2017-10-28 NOTE — Patient Outreach (Signed)
[facility]) Care Management  10/28/2017  JAHNAVI MURATORE [birth year] [phone]   Call placed to member to follow up on current health status, no answer.  HIPAA compliant voice message left.  Will await call back, if no call back will follow up next week.  [name], [place], MSN [place] [phone]

## 2017-10-29 ENCOUNTER — Other Ambulatory Visit: Payer: Self-pay | Admitting: Internal Medicine

## 2017-10-31 ENCOUNTER — Other Ambulatory Visit: Payer: Self-pay | Admitting: Internal Medicine

## 2017-10-31 NOTE — Telephone Encounter (Signed)
Patient is requesting refill on the solution in machine, pls contact

## 2017-10-31 NOTE — Telephone Encounter (Signed)
Requesting Cetirizine to be filled @ [facility] on [address].

## 2017-11-01 ENCOUNTER — Emergency Department (HOSPITAL_COMMUNITY)
Admission: EM | Admit: 2017-11-01 | Discharge: 2017-11-01 | Disposition: A | Discharge status: Home / Self Care | Payer: Medicare Other | Attending: Emergency Medicine | Admitting: Emergency Medicine

## 2017-11-01 ENCOUNTER — Emergency Department (HOSPITAL_COMMUNITY): Payer: Medicare Other

## 2017-11-01 ENCOUNTER — Encounter (HOSPITAL_COMMUNITY): Payer: Self-pay | Admitting: Emergency Medicine

## 2017-11-01 ENCOUNTER — Other Ambulatory Visit: Payer: Self-pay | Admitting: Internal Medicine

## 2017-11-01 DIAGNOSIS — J45909 Unspecified asthma, uncomplicated: Secondary | ICD-10-CM | POA: Diagnosis not present

## 2017-11-01 DIAGNOSIS — R51 Headache: Secondary | ICD-10-CM | POA: Insufficient documentation

## 2017-11-01 DIAGNOSIS — Z87891 Personal history of nicotine dependence: Secondary | ICD-10-CM | POA: Insufficient documentation

## 2017-11-01 DIAGNOSIS — Z79899 Other long term (current) drug therapy: Secondary | ICD-10-CM | POA: Diagnosis not present

## 2017-11-01 DIAGNOSIS — R072 Precordial pain: Secondary | ICD-10-CM | POA: Diagnosis not present

## 2017-11-01 DIAGNOSIS — K5900 Constipation, unspecified: Secondary | ICD-10-CM

## 2017-11-01 DIAGNOSIS — R0981 Nasal congestion: Secondary | ICD-10-CM | POA: Diagnosis not present

## 2017-11-01 DIAGNOSIS — R079 Chest pain, unspecified: Secondary | ICD-10-CM | POA: Diagnosis not present

## 2017-11-01 DIAGNOSIS — Z7901 Long term (current) use of anticoagulants: Secondary | ICD-10-CM | POA: Diagnosis not present

## 2017-11-01 DIAGNOSIS — R109 Unspecified abdominal pain: Secondary | ICD-10-CM | POA: Diagnosis not present

## 2017-11-01 DIAGNOSIS — I10 Essential (primary) hypertension: Secondary | ICD-10-CM | POA: Insufficient documentation

## 2017-11-01 DIAGNOSIS — R0789 Other chest pain: Secondary | ICD-10-CM | POA: Diagnosis not present

## 2017-11-01 LAB — I-STAT TROPONIN, ED
TROPONIN I, POC: 0 ng/mL (ref 0.00–0.08)
Troponin i, poc: 0.01 ng/mL (ref 0.00–0.08)

## 2017-11-01 LAB — BASIC METABOLIC PANEL
ANION GAP: 11 (ref 5–15)
BUN: 11 mg/dL (ref 6–20)
CALCIUM: 9.2 mg/dL (ref 8.9–10.3)
CO2: 21 mmol/L — AB (ref 22–32)
CREATININE: 0.68 mg/dL (ref 0.44–1.00)
Chloride: 107 mmol/L (ref 101–111)
Glucose, Bld: 98 mg/dL (ref 65–99)
Potassium: 3.4 mmol/L — ABNORMAL LOW (ref 3.5–5.1)
SODIUM: 139 mmol/L (ref 135–145)

## 2017-11-01 LAB — CBC
HCT: 37.5 % (ref 36.0–46.0)
Hemoglobin: 12.4 g/dL (ref 12.0–15.0)
MCH: 27 pg (ref 26.0–34.0)
MCHC: 33.1 g/dL (ref 30.0–36.0)
MCV: 81.5 fL (ref 78.0–100.0)
PLATELETS: 219 10*3/uL (ref 150–400)
RBC: 4.6 MIL/uL (ref 3.87–5.11)
RDW: 15.3 % (ref 11.5–15.5)
WBC: 3.7 10*3/uL — ABNORMAL LOW (ref 4.0–10.5)

## 2017-11-01 MED ORDER — SIMETHICONE 80 MG PO CHEW: 80.0000 mg | CHEWABLE_TABLET | Freq: Four times a day (QID) | ORAL | 0 refills | Status: DC | PRN

## 2017-11-01 MED ORDER — ALBUTEROL SULFATE (2.5 MG/3ML) 0.083% IN NEBU: 2.5000 mg | INHALATION_SOLUTION | Freq: Four times a day (QID) | RESPIRATORY_TRACT | 12 refills | Status: DC | PRN

## 2017-11-01 MED ORDER — MAGNESIUM HYDROXIDE 400 MG/5ML PO SUSP: 15.0000 mL | Freq: Every evening | ORAL | 0 refills | Status: DC | PRN

## 2017-11-01 MED ORDER — ALBUTEROL SULFATE (2.5 MG/3ML) 0.083% IN NEBU
2.5000 mg | INHALATION_SOLUTION | Freq: Once | RESPIRATORY_TRACT | Status: AC
  Administered 2017-11-01: 2.5 mg via RESPIRATORY_TRACT
  Filled 2017-11-01: qty 3

## 2017-11-01 MED ORDER — ALBUTEROL (5 MG/ML) CONTINUOUS INHALATION SOLN
2.5000 mg/h | INHALATION_SOLUTION | RESPIRATORY_TRACT | Status: DC
  Filled 2017-11-01: qty 20

## 2017-11-01 MED ORDER — ACETAMINOPHEN 500 MG PO TABS
1000.0000 mg | ORAL_TABLET | Freq: Once | ORAL | Status: AC
  Administered 2017-11-01: 1000 mg via ORAL
  Filled 2017-11-01: qty 2

## 2017-11-01 MED ORDER — OXYMETAZOLINE HCL 0.05 % NA SOLN
1.0000 | Freq: Once | NASAL | Status: AC
  Administered 2017-11-01: 1 via NASAL
  Filled 2017-11-01: qty 15

## 2017-11-01 NOTE — Progress Notes (Signed)
Albuterol 0.083% neb solution rx per Dr Reesa Chew faxed to [place].

## 2017-11-01 NOTE — ED Notes (Signed)
ED Provider at bedside. 

## 2017-11-01 NOTE — Discharge Instructions (Signed)
Your caregiver has diagnosed you as having chest pain that is not specific for one problem, but does not require admission.  You are at low risk for an acute heart condition or other serious illness. Chest pain comes from many different causes.  SEEK IMMEDIATE MEDICAL ATTENTION IF: You have severe chest pain, especially if the pain is crushing or pressure-like and spreads to the arms, back, neck, or jaw, or if you have sweating, nausea (feeling sick to your stomach), or shortness of breath. THIS IS AN EMERGENCY. Don't wait to see if the pain will go away. Get medical help at once. Call 911 or 0 (operator). DO NOT drive yourself to the hospital.  Your chest pain gets worse and does not go away with rest.  You have an attack of chest pain lasting longer than usual, despite rest and treatment with the medications your caregiver has prescribed.  You wake from sleep with chest pain or shortness of breath.  You feel dizzy or faint.  You have chest pain not typical of your usual pain for which you originally saw your caregiver.  Get help right away if: You have a fever and your symptoms suddenly get worse. You leak stool or have blood in your stool. Your abdomen is bloated. You have severe pain in your abdomen. You feel dizzy or you faint.

## 2017-11-01 NOTE — ED Provider Notes (Signed)
[facility] EMERGENCY DEPARTMENT Provider Note   CSN: [id number] Arrival date & time: 11/01/17  2376     History   Chief Complaint Chief Complaint  Patient presents with  . Chest Pain    HPI [name] is a 67 y.o. female with a past medical history of paroxysmal atrial fibrillation, hypertension, reflux, asthma and recent ablation surgery for A. fib about 1 month ago.  Patient presents the emergency department with chief complaint of chest pain.  She said at around 6 AM she woke up and she had extremely sharp chest pain from the retrosternal region radiating under her right breast.  She states that she tried to lay down with the pain was too sharp and she came to the emergency department.  She denies nausea, vomiting, diaphoresis.  She does admit that she has been fairly constipated lately and thought that it might be gas.  HPI  Past Medical History:  Diagnosis Date  . A-fib ([place])   . Allergy    seasonal  . Asthma   . Chronic anticoagulation   . GERD (gastroesophageal reflux disease)   . Hypertension   . PAF (paroxysmal atrial fibrillation) ([place]) 11/26/2015   April/May 2017: Multiple ED visits for A. Fib.  02/12/16: Improved on diltiazem IV. Cardiology consulted in the ED and discharge patient with diltiazem XR 120 mg once daily with 60 mg by mouth every 6 hours as needed for symptomatic palpitations, flecainide 50 mg twice daily. Referred to the atrial fibrillation clinic.  02/17/16: Seen in the atrial fibrillation clinic by NP Roderic Palau. Normal sin    Patient Active Problem List   Diagnosis Date Noted  . Hyperpigmented skin lesion 09/09/2017  . Allergic contact dermatitis 04/27/2017  . Osteoporosis without current pathological fracture 04/15/2017  . Normocytic anemia 10/19/2016  . Hyperlipidemia 03/18/2016  . Special screening for malignant neoplasms, colon 03/17/2016  . Eczema 03/17/2016  . Cervical cancer screening 03/17/2016  . Assistance with  transportation 03/17/2016  . Hypertension   . GERD (gastroesophageal reflux disease)   . PAF (paroxysmal atrial fibrillation) ([place]) 11/26/2015  . Chronic anticoagulation 11/26/2015  . Folliculitis 28/31/5176  . Marijuana abuse 06/11/2015  . Asthma, mild intermittent     Past Surgical History:  Procedure Laterality Date  . ATRIAL FIBRILLATION ABLATION N/A 09/30/2017   Procedure: ATRIAL FIBRILLATION ABLATION;  Surgeon: Thompson Grayer, MD;  Location: [facility];  Service: Cardiovascular;  Laterality: N/A;  . TONSILLECTOMY    . TUBAL LIGATION      OB History    No data available       Home Medications    Prior to Admission medications   Medication Sig Start Date End Date Taking? Authorizing Provider  acetaminophen (TYLENOL) 325 MG tablet Take 650 mg by mouth every 6 (six) hours as needed for mild pain.    [provider]  albuterol (PROVENTIL) (2.5 MG/3ML) 0.083% nebulizer solution Take 3 mLs (2.5 mg total) by nebulization every 6 (six) hours as needed for wheezing or shortness of breath. 11/01/17   [name], MD  CARTIA XT 120 MG 24 hr capsule TAKE 1 CAPSULE(120 MG) BY MOUTH DAILY Patient taking differently: TAKE 1 CAPSULE(120 MG) BY MOUTH EVERY EVENING 12/17/16   Sherran Needs, NP  dextromethorphan-guaiFENesin [facility] DM) 30-600 MG 12hr tablet Take 1 tablet by mouth 2 (two) times daily. 10/21/17   [name], MD  diclofenac sodium (VOLTAREN) 1 % GEL Apply 4 g topically 4 (four) times daily.  10/21/17   [name], MD  diltiazem (CARDIZEM) 60 MG tablet TAKE 1 TABLET BY MOUTH EVERY 6 HOURS AS NEEDED FOR RAPID AFIB HEART RATE OVER 100 06/10/17   Sherran Needs, NP  flecainide (TAMBOCOR) 100 MG tablet Take 1 tablet (100 mg total) by mouth 2 (two) times daily. 10/27/17   Sherran Needs, NP  fluticasone (FLONASE) 50 MCG/ACT nasal spray Place 1 spray into both nostrils daily as needed for allergies or rhinitis.    [provider]  fluticasone (FLOVENT HFA)  44 MCG/ACT inhaler Inhale 1 puff into the lungs 2 (two) times daily. 08/08/17   Molt, [place], DO  losartan (COZAAR) 50 MG tablet Take 1 tablet (50 mg total) by mouth daily. 10/21/17   [name], MD  montelukast (SINGULAIR) 10 MG tablet Take 1 tablet (10 mg total) by mouth at bedtime. Patient taking differently: Take 10 mg by mouth at bedtime as needed (allergies).  09/09/17   [name], MD  Multiple Vitamin (MULTI-VITAMIN PO) Take 1 tablet by mouth daily.    [provider]  OVER THE COUNTER MEDICATION Take 4 oz by mouth daily as needed (PRUNE JUICE AS NEEDED FOR CONSTIPATION RELIEF).    [provider]  polyethylene glycol powder (GLYCOLAX/MIRALAX) powder Mix 17g of Miralax in 8 ounces of water every other day Patient taking differently: Take by mouth See admin instructions. Mix 17 g. in 8 oz water and drink daily as needed for constipation 07/20/17   [name], MD  potassium chloride SA (K-DUR,KLOR-CON) 10 MEQ tablet Take 2 tablets (20 mEq total) by mouth daily. 11/12/16   Sherran Needs, NP  ranitidine (ZANTAC) 150 MG tablet TAKE 1 TABLET BY MOUTH TWICE DAILY Patient taking differently: TAKE 1 TABLET (150 MG) BY MOUTH TWICE DAILY AS NEEDED FOR INDIGESTION 03/16/17   Axel Filler, MD  rivaroxaban (XARELTO) 20 MG TABS tablet Take 1 tablet (20 mg total) by mouth daily with supper. 10/25/17   Sherran Needs, NP  tiotropium (SPIRIVA) 18 MCG inhalation capsule Place 1 capsule (18 mcg total) into inhaler and inhale daily. 10/21/17   [name], MD    Family History Family History  Problem Relation Age of Onset  . Heart attack Mother        [name] age  . Breast cancer Sister        Late 92s; now s/p mastectomy   . Lung cancer Brother        x 2  . Cancer Sister   . Cancer Sister   . Cancer Sister   . Colon cancer Neg Hx   . Colon polyps Neg Hx   . Esophageal cancer Neg Hx   . Rectal cancer Neg Hx   . Stomach cancer Neg Hx     Social History Social  History   Tobacco Use  . Smoking status: Former Smoker    Last attempt to quit: 10/24/2015    Years since quitting: 2.0  . Smokeless tobacco: Never Used  Substance Use Topics  . Alcohol use: No  . Drug use: No    Comment: Smoked marijuana in the past.     Allergies   Albuterol; Celecoxib; and Protonix [pantoprazole sodium]   Review of Systems Review of Systems  Ten systems reviewed and are negative for acute change, except as noted in the HPI.   Physical Exam Updated Vital Signs BP (!) 159/93   Pulse 69   Temp 98.7 F (37.1 C) (Oral)   Resp  16   Ht 5\' 7"  (1.702 m)   Wt 74.8 kg (165 lb)   SpO2 100%   BMI 25.84 kg/m   Physical Exam Physical Exam  Nursing note and vitals reviewed. Constitutional: She is oriented to person, place, and time. She appears well-developed and well-nourished. No distress.  HENT:  Head: Normocephalic and atraumatic.  Eyes: Conjunctivae normal and EOM are normal. Pupils are equal, round, and reactive to light. No scleral icterus.  Neck: Normal range of motion.  Cardiovascular: Normal rate, regular rhythm and normal heart sounds.  Exam reveals no gallop and no friction rub.   No murmur heard. Pulmonary/Chest: Effort normal minimal expiratory wheeze in the upper lobes .no respiratory distress. No rashes, no palpable chest wall tenderness or crepitus. Abdominal: Soft. Bowel sounds are normal. She exhibits no distension and no mass. There is no tenderness. There is no guarding.  Neurological: She is alert and oriented to person, place, and time.  Skin: Skin is warm and dry. She is not diaphoretic.     ED Treatments / Results  Labs (all labs ordered are listed, but only abnormal results are displayed) Labs Reviewed  BASIC METABOLIC PANEL - Abnormal; Notable for the following components:      Result Value   Potassium 3.4 (*)    CO2 21 (*)    All other components within normal limits  CBC - Abnormal; Notable for the following components:    WBC 3.7 (*)    All other components within normal limits  I-STAT TROPONIN, ED  I-STAT TROPONIN, ED    EKG  EKG Interpretation  Date/Time:  Tuesday November 01 2017 06:40:17 EDT Ventricular Rate:  76 PR Interval:  166 QRS Duration: 88 QT Interval:  426 QTC Calculation: 479 R Axis:   77 Text Interpretation:  Normal sinus rhythm Right atrial enlargement Borderline ECG Confirmed by Isla Pence [id number]) on 11/01/2017 11:09:31 AM       Radiology Dg Chest 2 View  Result Date: 11/01/2017 CLINICAL DATA:  Midline to right-sided chest pain under right breast. Recent flu. CP EXAM: CHEST - 2 VIEW COMPARISON:  10/12/2017 FINDINGS: The heart size and mediastinal contours are within normal limits. Aortic atherosclerosis noted. Both lungs are clear. The visualized skeletal structures are unremarkable. IMPRESSION: No active cardiopulmonary disease. Aortic Atherosclerosis (ICD10-I70.0). Electronically Signed   By: Kerby Moors M.D.   On: 11/01/2017 07:47    Procedures Procedures (including critical care time)  Medications Ordered in ED Medications  acetaminophen (TYLENOL) tablet 1,000 mg (not administered)  albuterol (PROVENTIL,VENTOLIN) solution continuous neb (not administered)     Initial Impression / Assessment and Plan / ED Course  I have reviewed the triage vital signs and the nursing notes.  Pertinent labs & imaging results that were available during my care of the patient were reviewed by me and considered in my medical decision making (see chart for details).     Patient's EKG reviewed without significant abnormalities, no signs of repeat A. fib.  Patient with some mild wheezing given albuterol here in the emergency department with significant resolution of her symptoms.  She also complaining of facial congestion and headache with relief with a dose of Afrin and Tylenol.  Patient has 2- troponins here in the emergency department.  Have low suspicion for ACS.  She has a lot of gas  and stool on her abdominal film which is suggestive of constipation.  Patient thought she may be having gas pain and I believe she is correct in her  assessment.  She was discharged with treatment for constipation and gas.  Have asked patient follow-up with her primary care physician in the next 2 days.  I discussed return precautions.  She appears appropriate for discharge at this time  Final Clinical Impressions(s) / ED Diagnoses   Final diagnoses:  None    ED Discharge Orders    None       Margarita Mail, PA-C 11/01/17 1556    Isla Pence, MD 11/01/17 1600

## 2017-11-01 NOTE — ED Triage Notes (Signed)
Pt reports sharp chest pain with some shortness of breath onset this morning.

## 2017-11-01 NOTE — ED Notes (Signed)
Pt transported to xray for abd view

## 2017-11-02 ENCOUNTER — Other Ambulatory Visit: Payer: Self-pay | Admitting: Internal Medicine

## 2017-11-02 ENCOUNTER — Other Ambulatory Visit: Payer: Self-pay | Admitting: *Deleted

## 2017-11-02 ENCOUNTER — Ambulatory Visit (INDEPENDENT_AMBULATORY_CARE_PROVIDER_SITE_OTHER): Payer: Medicare Other | Admitting: Internal Medicine

## 2017-11-02 ENCOUNTER — Telehealth: Payer: Self-pay | Admitting: Internal Medicine

## 2017-11-02 ENCOUNTER — Other Ambulatory Visit: Payer: Self-pay

## 2017-11-02 DIAGNOSIS — I480 Paroxysmal atrial fibrillation: Secondary | ICD-10-CM | POA: Diagnosis not present

## 2017-11-02 DIAGNOSIS — J0140 Acute pansinusitis, unspecified: Secondary | ICD-10-CM

## 2017-11-02 DIAGNOSIS — I10 Essential (primary) hypertension: Secondary | ICD-10-CM

## 2017-11-02 DIAGNOSIS — M810 Age-related osteoporosis without current pathological fracture: Secondary | ICD-10-CM

## 2017-11-02 DIAGNOSIS — J329 Chronic sinusitis, unspecified: Secondary | ICD-10-CM | POA: Insufficient documentation

## 2017-11-02 MED ORDER — VITAMIN D (ERGOCALCIFEROL) 50 MCG (2000 UT) PO CAPS: 2000.0000 [IU] | ORAL_CAPSULE | Freq: Every day | ORAL | 2 refills | Status: DC

## 2017-11-02 MED ORDER — SODIUM CHLORIDE-SODIUM BICARB 2300-700 MG NA KIT: 1.0000 [IU] | PACK | Freq: Two times a day (BID) | NASAL | 2 refills | Status: DC

## 2017-11-02 MED ORDER — AMOXICILLIN-POT CLAVULANATE 875-125 MG PO TABS: 1.0000 | ORAL_TABLET | Freq: Two times a day (BID) | ORAL | 0 refills | Status: DC

## 2017-11-02 MED ORDER — CALCIUM CARBONATE-VITAMIN D 600-400 MG-UNIT PO TABS: 1.0000 | ORAL_TABLET | Freq: Every day | ORAL | 2 refills | Status: DC

## 2017-11-02 MED ORDER — FLUTICASONE PROPIONATE 50 MCG/ACT NA SUSP: 1.0000 | Freq: Every day | NASAL | 0 refills | Status: DC | PRN

## 2017-11-02 MED ORDER — ALENDRONATE SODIUM 70 MG PO TABS: 70.0000 mg | ORAL_TABLET | ORAL | 11 refills | Status: DC

## 2017-11-02 NOTE — Telephone Encounter (Signed)
Spoke with patient who recently had an ablation and was wondering if she could use her albuterol machine since developing a cold..  She is going to visit her PCP regarding her cold and I recommended that she consult her physician if there are any contraindications.. She verbalized understanding.Marland Kitchen

## 2017-11-02 NOTE — Assessment & Plan Note (Signed)
BP Readings from Last 3 Encounters:  11/02/17 134/83  11/01/17 (!) 142/89  10/27/17 (!) 158/88   Her blood pressure was within goal.  Continue current management.

## 2017-11-02 NOTE — Assessment & Plan Note (Signed)
We had a discussion again today regarding starting her on Fosamax. Patient is agreeable to start Fosamax.  She was given a prescription of Fosamax along with instructions.  She will take 70 mg/week. She was also given a prescription for calcium and vitamin D supplement.

## 2017-11-02 NOTE — Assessment & Plan Note (Addendum)
Patient continued to have sinus congestion along with headache and facial pain since she was diagnosed with flu on October 12, 2017 after having few days of upper respiratory symptoms.  Most likely superadded bacterial sinusitis at this point.  She was given a 10-day course of Augmentin. She was advised to use Nettie pot for sinus irrigation. She can continue using Flonase and saline nasal spray.  Addendum.  Patient called that she developed nausea and vomiting after taking Augmentin last night, she thinks this is too strong for her. Sent a new prescription for amoxicillin 500 mg twice daily for 10 days.

## 2017-11-02 NOTE — Assessment & Plan Note (Signed)
Remained in sinus rhythm since her ablation. Her last cardiology appointment was on October 27, 2017 and plan was to continue flecainide, diltiazem and Xarelto.

## 2017-11-02 NOTE — Telephone Encounter (Signed)
Patient got all her medicine, expect the one nasal spray, she wasn't sure if she was suppose to get it over the counter or a prescription was sent

## 2017-11-02 NOTE — Patient Instructions (Signed)
Thank you for visiting clinic today. As we discussed I am giving you a 10-day course of Augmentin, you will take it twice daily.  It can cause upset stomach, taking it with food and eating yogurt sometimes help. You can try Nettie pot and see if that will help clearing your sinus. You can continue using Flonase and saline nasal spray. As we also discussed I am going to start you on a new medicine called Fosamax for your osteoporosis, you will take 1 tablet every week along with a full glass of water and stay upright for an hour after that. Follow-up in 1 month.

## 2017-11-02 NOTE — Progress Notes (Signed)
   CC: Sinus congestion, headache and facial pain for 3 weeks.  HPI:  [name] is a 67 y.o. past medical history as listed below came to the clinic with complaint of continuous sinus congestion associated with headache and facial pain for the past 3 weeks. Patient was diagnosed with influenza A positive on October 12, 2017 and was treated with Tamiflu and doxycycline by ED provider, she did completed both courses.  But since then her symptoms of fever and malaise improved but she continued to get bad sinus congestion, with brownish discharge, worsening shortness of breath and cough since then.  She denies any fever or chills.  She was seen in ED yesterday with right-sided chest pain and was sent home with Gas-X and MiraLAX as imaging shows a heavy stool burden.  She is using Flonase and  saline nasal spray with minimum relief.  She was also given Afrin nasal spray from the ED which she did not found helpful. Any palpitations or wheezing.  Please see assessment and plan for her chronic conditions.  Past Medical History:  Diagnosis Date  . A-fib ([place])   . Allergy    seasonal  . Asthma   . Chronic anticoagulation   . GERD (gastroesophageal reflux disease)   . Hypertension   . PAF (paroxysmal atrial fibrillation) ([place]) 11/26/2015   April/May 2017: Multiple ED visits for A. Fib.  02/12/16: Improved on diltiazem IV. Cardiology consulted in the ED and discharge patient with diltiazem XR 120 mg once daily with 60 mg by mouth every 6 hours as needed for symptomatic palpitations, flecainide 50 mg twice daily. Referred to the atrial fibrillation clinic.  02/17/16: Seen in the atrial fibrillation clinic by NP Roderic Palau. Normal sin   Review of Systems: Negative except mentioned in HPI.  Physical Exam:  Vitals:   11/02/17 1031  BP: 134/83  Pulse: 74  Temp: 98.2 F (36.8 C)  TempSrc: Oral  SpO2: 99%  Weight: 162 lb 6.4 oz (73.7 kg)   Vitals:   11/02/17 1031  BP: 134/83  Pulse: 74    Temp: 98.2 F (36.8 C)  TempSrc: Oral  SpO2: 99%  Weight: 162 lb 6.4 oz (73.7 kg)   General: Vital signs reviewed.  Patient is well-developed and well-nourished, in no acute distress and cooperative with exam.  HEENT: Normocephalic and atraumatic.  Marked erythema and edema of bilateral nasal turbinate, tenderness along maxillary and  frontal sinuses. Cardiovascular: RRR, S1 normal, S2 normal, no murmurs, gallops, or rubs. Pulmonary/Chest: Clear to auscultation bilaterally, no wheezes, rales, or rhonchi. Abdominal: Soft, non-tender, non-distended, BS +, no masses, organomegaly, or guarding present.  Extremities: No lower extremity edema bilaterally,  pulses symmetric and intact bilaterally. No cyanosis or clubbing. Skin: Warm, dry and intact. No rashes or erythema. Psychiatric: Normal mood and affect. speech and behavior is normal. Cognition and memory are normal.  Assessment & Plan:   See Encounters Tab for problem based charting.  Patient discussed with Dr. Eppie Gibson.

## 2017-11-02 NOTE — Telephone Encounter (Signed)
[name] is calling to find out if Dr. Rayann Heman says she can use her Albuterol  Machine to help her with her breathing, she caught a cold and has more shortness of breath. Also wants to know will she go back in to AFIB if she uses the Albuterol.

## 2017-11-03 ENCOUNTER — Other Ambulatory Visit: Payer: Self-pay | Admitting: *Deleted

## 2017-11-03 ENCOUNTER — Telehealth: Payer: Self-pay

## 2017-11-03 MED ORDER — AMOXICILLIN 500 MG PO TABS: 500.0000 mg | ORAL_TABLET | Freq: Two times a day (BID) | ORAL | 0 refills | Status: AC

## 2017-11-03 NOTE — Telephone Encounter (Signed)
Stated she received the nasal spray.

## 2017-11-03 NOTE — Telephone Encounter (Signed)
Pt states abx "too strong"; she ate a whole meal before taking it; had nausea/vomiting. She would like something else sent to her pharmacy. Thanks

## 2017-11-03 NOTE — Telephone Encounter (Signed)
Patient is calling back, pls call patient

## 2017-11-03 NOTE — Addendum Note (Signed)
Addended by: [name] on: 11/03/2017 04:40 PM   Modules accepted: Orders

## 2017-11-03 NOTE — Telephone Encounter (Signed)
Called pt - no answer; left message to give us a call back. 

## 2017-11-03 NOTE — Patient Outreach (Signed)
[name]) Care Management  11/03/2017  [name] 1951-07-03 [id number]   Call placed to member to follow up on current health status, no answer.  HIPAA compliant voice message left.  This is 2nd consecutive unsuccessful attempt.  Outreach letter sent, will await response. If no response in 10 business days, will make final attempt to contact.  If remain unsuccessful, will close case.  [name], [place], MSN [place] [phone]

## 2017-11-03 NOTE — Telephone Encounter (Signed)
Called pt, she agrees

## 2017-11-03 NOTE — Telephone Encounter (Signed)
Pt is calling back to speak with a nurse about antibiotic. Please call pt back.

## 2017-11-03 NOTE — Progress Notes (Signed)
Case discussed with Dr. Amin at the time of the visit.  We reviewed the resident's history and exam and pertinent patient test results.  I agree with the assessment, diagnosis and plan of care documented in the resident's note. 

## 2017-11-03 NOTE — Telephone Encounter (Signed)
Pt states antibiotic is too strong, requesting to speak with a nurse. Please call pt back.

## 2017-11-04 ENCOUNTER — Other Ambulatory Visit: Payer: Self-pay | Admitting: *Deleted

## 2017-11-04 ENCOUNTER — Ambulatory Visit: Payer: Self-pay | Admitting: *Deleted

## 2017-11-04 NOTE — Telephone Encounter (Signed)
I called her back and change her antibiotic to amoxicillin.

## 2017-11-04 NOTE — Addendum Note (Signed)
Addended by: Yvonna Alanis E on: 11/04/2017 11:11 AM   Modules accepted: Orders

## 2017-11-04 NOTE — Patient Outreach (Signed)
[place] [facility]) Care Management  11/04/2017  KSENIYA GRUNDEN 01-05-1951 [id number]   Voice message received back from member.  She report she still has not fully recovered from the flu and respiratory infection.  State she feel she may still need some assistance.  Agree to home visit within the next 2 weeks, if no needs for community care manager, will transition to health coach.  [name], [place], MSN [place] [phone]

## 2017-11-12 IMAGING — CR DG ORBITS COMPLETE 4+V
4 series · 4 of 4 positions shown · non-contrast
Comparison: None.

CLINICAL DATA: Left eye pain and swelling, hit with  car door

EXAM:
ORBITS - COMPLETE 4+ VIEW

[orbits [person_name]]
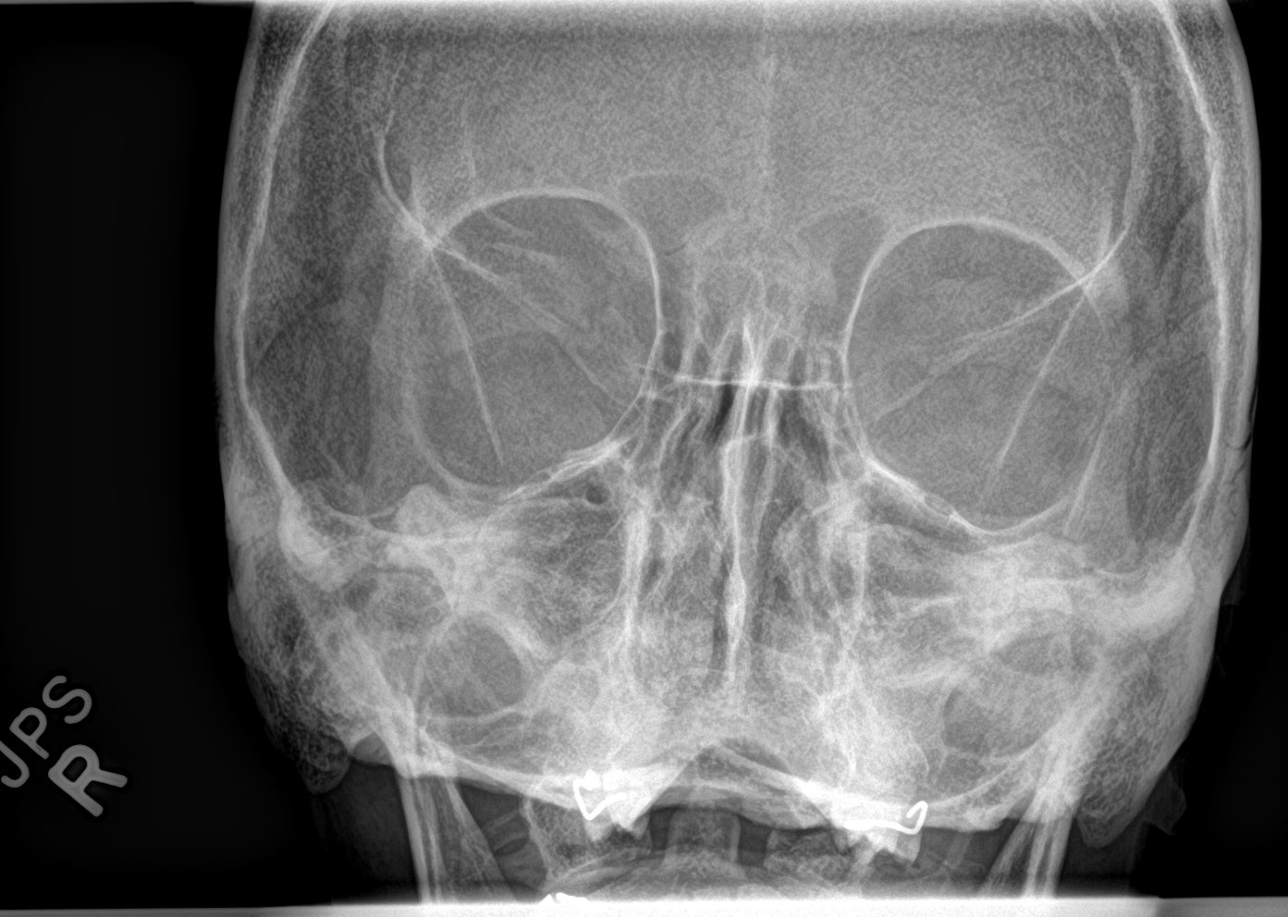

[orbits waters (1 of 2)]
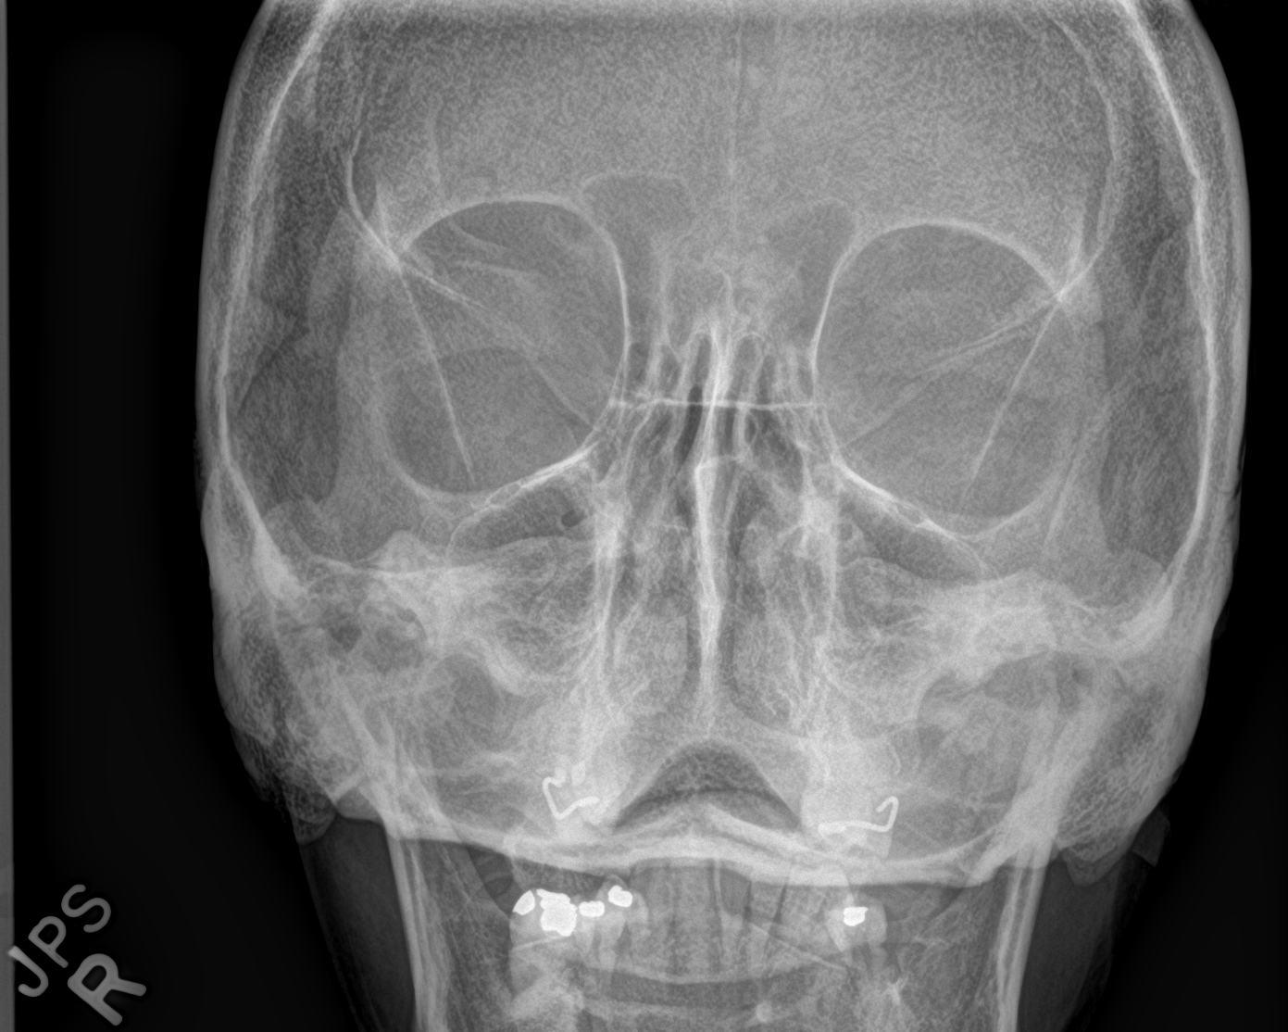

[orbits lat]
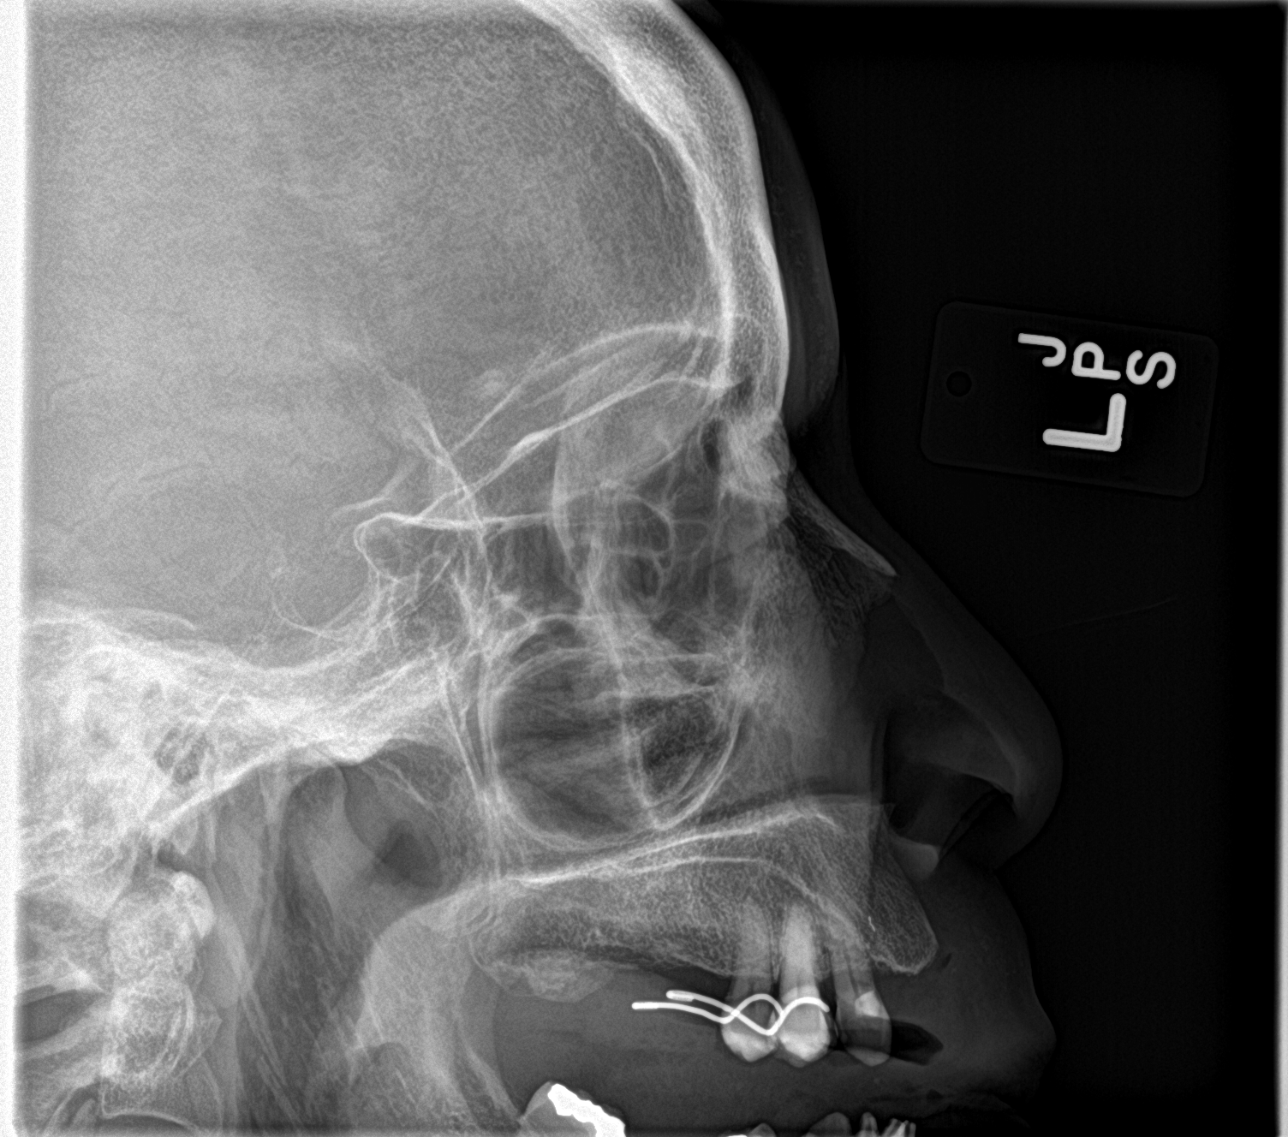

[orbits waters (2 of 2)]
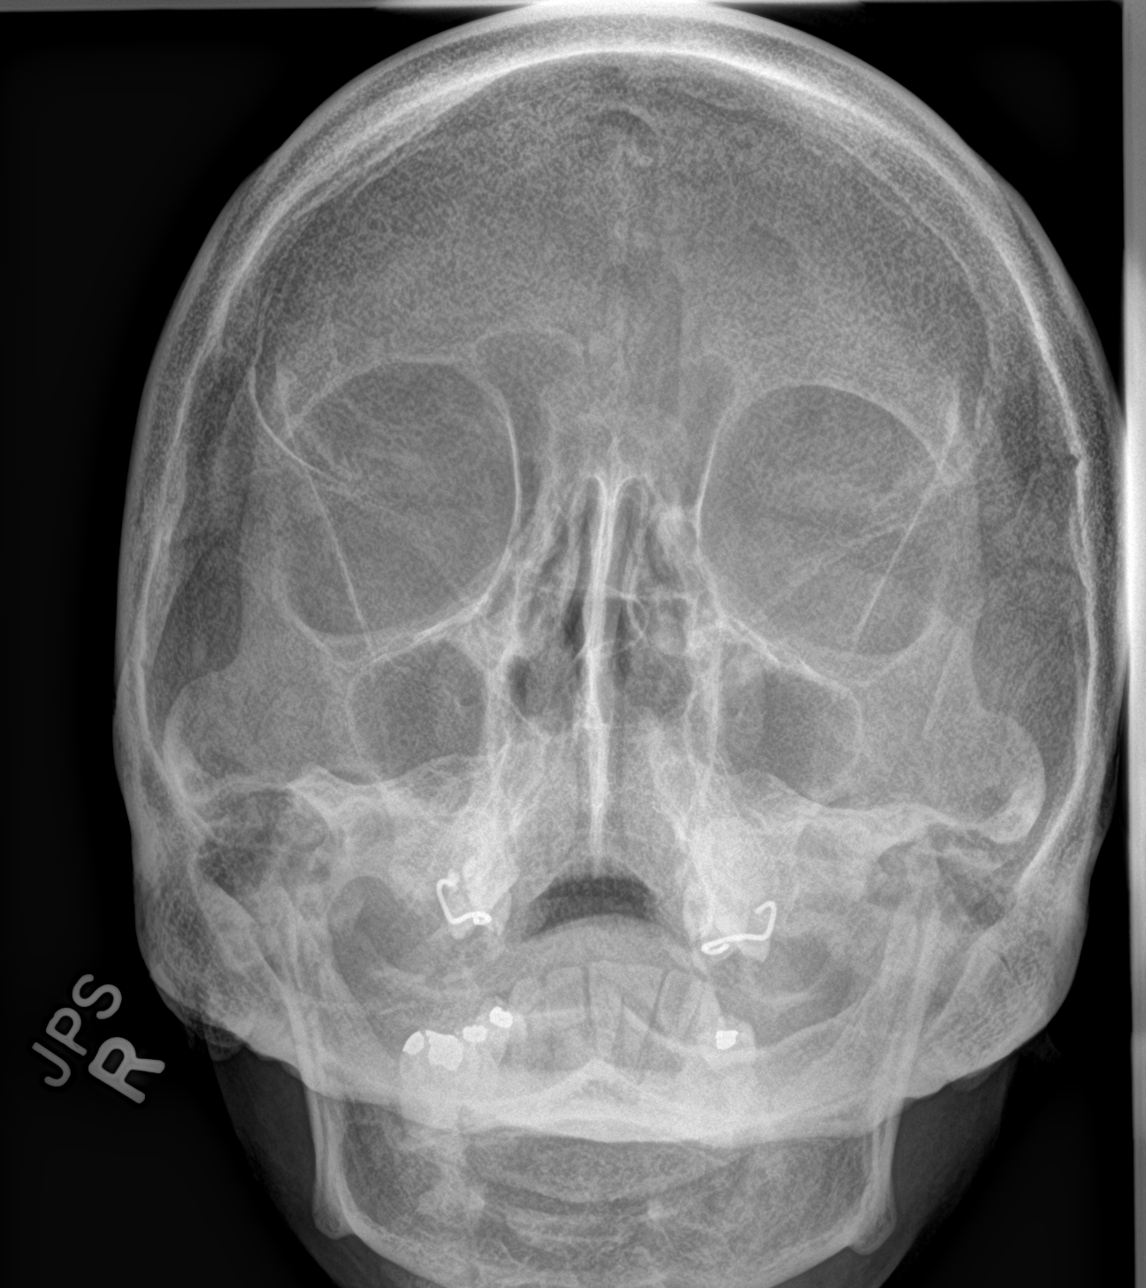

[4 of 4 positions shown; findings below may reference images not displayed]

FINDINGS: There is no evidence of fracture or other significant bone
abnormality. No orbital emphysema or sinus air-fluid levels are
seen.
IMPRESSION: Negative.

## 2017-11-14 ENCOUNTER — Telehealth: Payer: Self-pay | Admitting: Internal Medicine

## 2017-11-14 ENCOUNTER — Other Ambulatory Visit: Payer: Self-pay | Admitting: Internal Medicine

## 2017-11-14 MED ORDER — BENZONATATE 100 MG PO CAPS: 100.0000 mg | ORAL_CAPSULE | Freq: Three times a day (TID) | ORAL | 1 refills | Status: DC | PRN

## 2017-11-14 NOTE — Telephone Encounter (Signed)
Patient is requesting that physician called some cough medicine in

## 2017-11-14 NOTE — Telephone Encounter (Signed)
Notified patient of new prescription.

## 2017-11-14 NOTE — Telephone Encounter (Signed)
Patient states her cough is not at all better, still having SOB on exertion. She has 4 pills left of her amoxicillin. Has tried prescribed Mucinex DM but not effective. States otc meds does not work for her. Drinking plenty of fluids to loosen secretions. Was recently seen so she does not want to come back for reevaluation.  Requesting for a better cough med or cough syrup. Pls. Advise!

## 2017-11-14 NOTE — Telephone Encounter (Signed)
Sent a prescription for [name] to her pharmacy,

## 2017-11-15 ENCOUNTER — Telehealth: Payer: Self-pay | Admitting: Internal Medicine

## 2017-11-15 ENCOUNTER — Other Ambulatory Visit: Payer: Self-pay | Admitting: Internal Medicine

## 2017-11-15 ENCOUNTER — Ambulatory Visit: Payer: Self-pay | Admitting: *Deleted

## 2017-11-15 ENCOUNTER — Other Ambulatory Visit: Payer: Self-pay | Admitting: *Deleted

## 2017-11-15 NOTE — Telephone Encounter (Signed)
Patient is saying that her heart dr is saying she needs another inhaler that she can take 3x a day, pls patient

## 2017-11-15 NOTE — Telephone Encounter (Signed)
Do not see any note from cardiology. She can visit clinic if her symptoms are getting worse.

## 2017-11-15 NOTE — Patient Outreach (Signed)
[place] [facility]) Care Management  11/15/2017  [name] 1950/09/18 [id number]   Call received from member stating that she will have to cancel today's appointment.  She report she has still been experiencing some shortness of breath since she was diagnosed with the flu.  She has called her MD and will have an appointment today.  She will call this care manager back later with the outcome of visit.  Will follow up within one week if no call back.  [name], [place], MSN [place] [phone]

## 2017-11-15 NOTE — Telephone Encounter (Signed)
Patient is calling back  °

## 2017-11-15 NOTE — Telephone Encounter (Signed)
Spoke with patient who was having difficulty breathing, but has contributed that to a sinus cold and allergies...  She uses inhalers and has contacted her PCP about this breathing difficulty.. She was told by nurse at their office that this will be looked into and hopefully resolved..  She verbalized understanding.Marland Kitchen

## 2017-11-15 NOTE — Telephone Encounter (Signed)
New Message:    Pt c/o Shortness Of Breath: STAT if SOB developed within the last 24 hours or pt is noticeably SOB on the phone  1. Are you currently SOB (can you hear that pt is SOB on the phone)? Yes a little  2. How long have you been experiencing SOB? A couple of days  3. Are you SOB when sitting or when up moving around? sitting  4. Are you currently experiencing any other symptoms? Stomach feels like it's swelling up    Pt has been using her inhaler on occasions but has been advised

## 2017-11-17 ENCOUNTER — Ambulatory Visit: Payer: Self-pay | Admitting: *Deleted

## 2017-11-18 ENCOUNTER — Ambulatory Visit: Payer: Self-pay

## 2017-11-18 ENCOUNTER — Encounter: Payer: Self-pay | Admitting: Internal Medicine

## 2017-11-18 NOTE — Telephone Encounter (Signed)
Per chart, pt is scheduled to see [facility] today.Regenia Skeeter, Darlene Cassady3/29/201910:30 AM

## 2017-11-21 ENCOUNTER — Emergency Department (HOSPITAL_COMMUNITY)
Admission: EM | Admit: 2017-11-21 | Discharge: 2017-11-21 | Disposition: A | Discharge status: Home / Self Care | Payer: Medicare Other | Attending: Emergency Medicine | Admitting: Emergency Medicine

## 2017-11-21 ENCOUNTER — Emergency Department (HOSPITAL_COMMUNITY): Payer: Medicare Other

## 2017-11-21 ENCOUNTER — Other Ambulatory Visit: Payer: Self-pay

## 2017-11-21 ENCOUNTER — Encounter (HOSPITAL_COMMUNITY): Payer: Self-pay

## 2017-11-21 DIAGNOSIS — Z7901 Long term (current) use of anticoagulants: Secondary | ICD-10-CM | POA: Insufficient documentation

## 2017-11-21 DIAGNOSIS — Z79899 Other long term (current) drug therapy: Secondary | ICD-10-CM | POA: Insufficient documentation

## 2017-11-21 DIAGNOSIS — J441 Chronic obstructive pulmonary disease with (acute) exacerbation: Secondary | ICD-10-CM | POA: Diagnosis not present

## 2017-11-21 DIAGNOSIS — Z87891 Personal history of nicotine dependence: Secondary | ICD-10-CM | POA: Insufficient documentation

## 2017-11-21 DIAGNOSIS — I10 Essential (primary) hypertension: Secondary | ICD-10-CM | POA: Diagnosis not present

## 2017-11-21 DIAGNOSIS — R079 Chest pain, unspecified: Secondary | ICD-10-CM | POA: Insufficient documentation

## 2017-11-21 DIAGNOSIS — R05 Cough: Secondary | ICD-10-CM | POA: Diagnosis not present

## 2017-11-21 LAB — BASIC METABOLIC PANEL
ANION GAP: 7 (ref 5–15)
BUN: 11 mg/dL (ref 6–20)
CALCIUM: 9.2 mg/dL (ref 8.9–10.3)
CHLORIDE: 110 mmol/L (ref 101–111)
CO2: 24 mmol/L (ref 22–32)
Creatinine, Ser: 0.69 mg/dL (ref 0.44–1.00)
GFR calc non Af Amer: >60 mL/min (ref >60)
GLUCOSE: 97 mg/dL (ref 65–99)
POTASSIUM: 3.5 mmol/L (ref 3.5–5.1)
Sodium: 141 mmol/L (ref 135–145)

## 2017-11-21 LAB — CBC
HEMATOCRIT: 38.8 % (ref 36.0–46.0)
HEMOGLOBIN: 12.5 g/dL (ref 12.0–15.0)
MCH: 26.4 pg (ref 26.0–34.0)
MCHC: 32.2 g/dL (ref 30.0–36.0)
MCV: 82 fL (ref 78.0–100.0)
Platelets: 241 10*3/uL (ref 150–400)
RBC: 4.73 MIL/uL (ref 3.87–5.11)
RDW: 15.1 % (ref 11.5–15.5)
WBC: 3.7 10*3/uL — ABNORMAL LOW (ref 4.0–10.5)

## 2017-11-21 LAB — I-STAT TROPONIN, ED: TROPONIN I, POC: 0 ng/mL (ref 0.00–0.08)

## 2017-11-21 MED ORDER — PREDNISONE 20 MG PO TABS: 40.0000 mg | ORAL_TABLET | Freq: Every day | ORAL | 0 refills | Status: DC

## 2017-11-21 MED ORDER — DM-GUAIFENESIN ER 30-600 MG PO TB12
1.0000 | ORAL_TABLET | Freq: Once | ORAL | Status: AC
  Administered 2017-11-21: 1 via ORAL
  Filled 2017-11-21: qty 1

## 2017-11-21 MED ORDER — DM-GUAIFENESIN ER 30-600 MG PO TB12
1.0000 | ORAL_TABLET | Freq: Two times a day (BID) | ORAL | 0 refills | Status: DC
Start: 2017-11-21 — End: 2018-01-20

## 2017-11-21 MED ORDER — PREDNISONE 20 MG PO TABS
60.0000 mg | ORAL_TABLET | ORAL | Status: AC
  Administered 2017-11-21: 60 mg via ORAL
  Filled 2017-11-21: qty 3

## 2017-11-21 MED ORDER — ALBUTEROL SULFATE (2.5 MG/3ML) 0.083% IN NEBU
5.0000 mg | INHALATION_SOLUTION | Freq: Once | RESPIRATORY_TRACT | Status: DC
  Filled 2017-11-21: qty 6

## 2017-11-21 MED ORDER — LEVALBUTEROL TARTRATE 45 MCG/ACT IN AERO: 1.0000 | INHALATION_SPRAY | Freq: Four times a day (QID) | RESPIRATORY_TRACT | 12 refills | Status: DC | PRN

## 2017-11-21 MED ORDER — LEVALBUTEROL HCL 0.63 MG/3ML IN NEBU
0.6300 mg | INHALATION_SOLUTION | Freq: Four times a day (QID) | RESPIRATORY_TRACT | Status: DC | PRN
  Filled 2017-11-21 (×4): qty 3

## 2017-11-21 MED ORDER — LEVALBUTEROL TARTRATE 45 MCG/ACT IN AERO: 1.0000 | INHALATION_SPRAY | Freq: Four times a day (QID) | RESPIRATORY_TRACT | Status: DC | PRN

## 2017-11-21 MED ORDER — LEVALBUTEROL HCL 0.63 MG/3ML IN NEBU
0.6300 mg | INHALATION_SOLUTION | Freq: Once | RESPIRATORY_TRACT | Status: AC
  Administered 2017-11-21: 0.63 mg via RESPIRATORY_TRACT

## 2017-11-21 NOTE — Discharge Instructions (Addendum)
As discussed, today's evaluation has been somewhat reassuring. Please take your newly prescribed medication as directed and be sure to follow-up with your physician within 1 week.  Please use the provided Xopenex every six hours as needed

## 2017-11-21 NOTE — ED Triage Notes (Signed)
Pt reports she has had nasal congestion and chest congestion. Pt reports feeling like she has mucous in her chest she cant get up and has been using her neb treatment at home. She states sometimes this med puts her into afib. She had an ablation in February. No distress noted in triage.

## 2017-11-21 NOTE — ED Provider Notes (Signed)
[facility] EMERGENCY DEPARTMENT Provider Note   CSN: [id number] Arrival date & time: 11/21/17  0737     History   Chief Complaint Chief Complaint  Patient presents with  . Cough    HPI [name] is a 67 y.o. female.  HPI  Patient presents with concern of cough, difficulty clearing mucus secretion, and dyspnea. Patient has multiple medical issues including atrial fibrillation, cigarette addiction, COPD. She had ablation of atrial fibrillation earlier this year. She notes that since that procedure she has been doing generally well, though she has been instructed not to use much albuterol secondary to concern for precipitating recurrence of A. fib. Currently she denies any chest pain, palpitations.  On her, she notes increasing dyspnea over the past few days, possibly 1 week. No relief in spite of using occasional albuterol. No new fever. There is associated cough.   Past Medical History:  Diagnosis Date  . A-fib ([place])   . Allergy    seasonal  . Asthma   . Chronic anticoagulation   . GERD (gastroesophageal reflux disease)   . Hypertension   . PAF (paroxysmal atrial fibrillation) ([place]) 11/26/2015   April/May 2017: Multiple ED visits for A. Fib.  02/12/16: Improved on diltiazem IV. Cardiology consulted in the ED and discharge patient with diltiazem XR 120 mg once daily with 60 mg by mouth every 6 hours as needed for symptomatic palpitations, flecainide 50 mg twice daily. Referred to the atrial fibrillation clinic.  02/17/16: Seen in the atrial fibrillation clinic by NP Roderic Palau. Normal sin    Patient Active Problem List   Diagnosis Date Noted  . Sinusitis 11/02/2017  . Hyperpigmented skin lesion 09/09/2017  . Allergic contact dermatitis 04/27/2017  . Osteoporosis without current pathological fracture 04/15/2017  . Normocytic anemia 10/19/2016  . Hyperlipidemia 03/18/2016  . Special screening for malignant neoplasms, colon 03/17/2016  . Eczema  03/17/2016  . Cervical cancer screening 03/17/2016  . Assistance with transportation 03/17/2016  . Hypertension   . GERD (gastroesophageal reflux disease)   . PAF (paroxysmal atrial fibrillation) ([place]) 11/26/2015  . Chronic anticoagulation 11/26/2015  . Folliculitis 08/67/6195  . Marijuana abuse 06/11/2015  . Asthma, mild intermittent     Past Surgical History:  Procedure Laterality Date  . ATRIAL FIBRILLATION ABLATION N/A 09/30/2017   Procedure: ATRIAL FIBRILLATION ABLATION;  Surgeon: Thompson Grayer, MD;  Location: [facility];  Service: Cardiovascular;  Laterality: N/A;  . TONSILLECTOMY    . TUBAL LIGATION       OB History   None      Home Medications    Prior to Admission medications   Medication Sig Start Date End Date Taking? Authorizing Provider  acetaminophen (TYLENOL) 325 MG tablet Take 650 mg by mouth every 6 (six) hours as needed for mild pain.    [provider]  alendronate (FOSAMAX) 70 MG tablet Take 1 tablet (70 mg total) by mouth every 7 (seven) days. Take with a full glass of water on an empty stomach. 11/02/17 11/02/18  [name], MD  benzonatate (TESSALON PERLES) 100 MG capsule Take 1 capsule (100 mg total) by mouth 3 (three) times daily as needed for cough. 11/14/17 11/14/18  [name], MD  Calcium Carbonate-Vitamin D (CALTRATE 600+D) 600-400 MG-UNIT tablet Take 1 tablet by mouth daily. 11/02/17   [name], MD  CARTIA XT 120 MG 24 hr capsule TAKE 1 CAPSULE(120 MG) BY MOUTH DAILY Patient taking differently: TAKE 1 CAPSULE(120 MG) BY MOUTH EVERY  EVENING 12/17/16   Sherran Needs, NP  cetirizine (ZYRTEC) 10 MG tablet TAKE 1 TABLET BY MOUTH EVERY MORNING 11/01/17   [name], MD  dextromethorphan-guaiFENesin [facility] DM) 30-600 MG 12hr tablet Take 1 tablet by mouth 2 (two) times daily. Patient not taking: Reported on 11/01/2017 10/21/17   [name], MD  diclofenac sodium (VOLTAREN) 1 % GEL Apply 4 g topically 4 (four) times daily. 10/21/17    [name], MD  diltiazem (CARDIZEM) 60 MG tablet TAKE 1 TABLET BY MOUTH EVERY 6 HOURS AS NEEDED FOR RAPID AFIB HEART RATE OVER 100 06/10/17   Sherran Needs, NP  flecainide (TAMBOCOR) 100 MG tablet Take 1 tablet (100 mg total) by mouth 2 (two) times daily. 10/27/17   Sherran Needs, NP  fluticasone (FLONASE) 50 MCG/ACT nasal spray Place 1 spray into both nostrils daily as needed for allergies or rhinitis. 11/02/17   [name], MD  fluticasone (FLOVENT HFA) 44 MCG/ACT inhaler Inhale 1 puff into the lungs 2 (two) times daily. 08/08/17   Molt, Bethany, DO  losartan (COZAAR) 50 MG tablet Take 1 tablet (50 mg total) by mouth daily. 10/21/17   [name], MD  magnesium hydroxide (MILK OF MAGNESIA) 400 MG/5ML suspension Take 15-30 mLs by mouth at bedtime as needed for mild constipation. 11/01/17   Harris, Abigail, PA-C  montelukast (SINGULAIR) 10 MG tablet Take 1 tablet (10 mg total) by mouth at bedtime. Patient taking differently: Take 10 mg by mouth at bedtime as needed (allergies).  09/09/17   [name], MD  Multiple Vitamin (MULTI-VITAMIN PO) Take 1 tablet by mouth daily.    [provider]  OVER THE COUNTER MEDICATION Take 4 oz by mouth daily as needed (PRUNE JUICE AS NEEDED FOR CONSTIPATION RELIEF).    [provider]  polyethylene glycol powder (GLYCOLAX/MIRALAX) powder Mix 17g of Miralax in 8 ounces of water every other day Patient taking differently: Take by mouth See admin instructions. Mix 17 g. in 8 oz water and drink daily as needed for constipation 07/20/17   [name], MD  potassium chloride SA (K-DUR,KLOR-CON) 10 MEQ tablet Take 2 tablets (20 mEq total) by mouth daily. 11/12/16   Sherran Needs, NP  ranitidine (ZANTAC) 150 MG tablet TAKE 1 TABLET BY MOUTH TWICE DAILY Patient taking differently: TAKE 1 TABLET (150 MG) BY MOUTH TWICE DAILY AS NEEDED FOR INDIGESTION 03/16/17   Axel Filler, MD  rivaroxaban (XARELTO) 20 MG TABS tablet Take 1 tablet  (20 mg total) by mouth daily with supper. 10/25/17   Sherran Needs, NP  simethicone (GAS-X) 80 MG chewable tablet Chew 1 tablet (80 mg total) by mouth every 6 (six) hours as needed for flatulence. 11/01/17   Margarita Mail, PA-C  Sodium Chloride-Sodium Bicarb (NETI POT SINUS WASH) 2300-700 MG KIT Place 1 Units into the nose 2 (two) times daily. 11/02/17   [name], MD  tiotropium (SPIRIVA) 18 MCG inhalation capsule Place 1 capsule (18 mcg total) into inhaler and inhale daily. 10/21/17   [name], MD  Vitamin D, Ergocalciferol, 2000 units CAPS Take 2,000 Units by mouth daily. 11/02/17   [name], MD    Family History Family History  Problem Relation Age of Onset  . Heart attack Mother        [name] age  . Breast cancer Sister        Late 72s; now s/p mastectomy   . Lung cancer Brother        x 2  .  Cancer Sister   . Cancer Sister   . Cancer Sister   . Colon cancer Neg Hx   . Colon polyps Neg Hx   . Esophageal cancer Neg Hx   . Rectal cancer Neg Hx   . Stomach cancer Neg Hx     Social History Social History   Tobacco Use  . Smoking status: Former Smoker    Last attempt to quit: 10/24/2015    Years since quitting: 2.0  . Smokeless tobacco: Never Used  Substance Use Topics  . Alcohol use: No  . Drug use: No    Types: Marijuana    Comment: Smoked marijuana in the past.     Allergies   Albuterol; Celecoxib; and Protonix [pantoprazole sodium]   Review of Systems Review of Systems  Constitutional:       Per HPI, otherwise negative  HENT:       Per HPI, otherwise negative  Respiratory:       Per HPI, otherwise negative  Cardiovascular:       Per HPI, otherwise negative  Gastrointestinal: Negative for vomiting.  Endocrine:       Negative aside from HPI  Genitourinary:       Neg aside from HPI   Musculoskeletal:       Per HPI, otherwise negative  Skin: Negative.   Neurological: Negative for syncope.     Physical Exam Updated Vital Signs BP (!)  165/101 (BP Location: Right Arm)   Pulse 86   Temp 98.6 F (37 C) (Oral)   Resp 18   Ht 5' 7"  (1.702 m)   Wt 73.5 kg (162 lb)   SpO2 96%   BMI 25.37 kg/m   Physical Exam  Constitutional: She is oriented to person, place, and time. She appears well-developed and well-nourished. No distress.  HENT:  Head: Normocephalic and atraumatic.  Eyes: Conjunctivae and EOM are normal.  Cardiovascular: Normal rate and regular rhythm.  Pulmonary/Chest: She has decreased breath sounds. She has no wheezes.  Abdominal: She exhibits no distension.  Musculoskeletal: She exhibits no edema.  Neurological: She is alert and oriented to person, place, and time. No cranial nerve deficit.  Skin: Skin is warm and dry.  Psychiatric: She has a normal mood and affect.  Nursing note and vitals reviewed.    ED Treatments / Results  Labs (all labs ordered are listed, but only abnormal results are displayed) Labs Reviewed  CBC - Abnormal; Notable for the following components:      Result Value   WBC 3.7 (*)    All other components within normal limits  BASIC METABOLIC PANEL  I-STAT TROPONIN, ED    EKG None  Radiology Dg Chest 2 View  Result Date: 11/21/2017 CLINICAL DATA:  Chest pain EXAM: CHEST - 2 VIEW COMPARISON:  11/01/2017 FINDINGS: Normal heart size. Mild aortic tortuosity. Chronic hyperinflation with diaphragm flattening. Smooth opacity at the medial right base attributed to fat pad based on recent chest CT. IMPRESSION: Chronic hyperinflation suggesting COPD. No evidence of active disease. Electronically Signed   By: Monte Fantasia M.D.   On: 11/21/2017 08:19    Procedures Procedures (including critical care time)  Medications Ordered in ED Medications - No data to display  Chart review notable for 10 prior ED visits, typically with shortness of breath, similar to today.  Initial Impression / Assessment and Plan / ED Course  I have reviewed the triage vital signs and the nursing  notes.  Pertinent labs & imaging results that  were available during my care of the patient were reviewed by me and considered in my medical decision making (see chart for details).  Repeat exam the patient is in similar condition, no hypoxia, no tachypnea, no tachycardia, no A. fib. Evaluation here reassuring, no evidence for pneumonia, CHF, and findings consistent with COPD exacerbation. Given some consideration of her aversion to albuterol secondary to precipitating additional A. fib episodes, the patient will receive Xopenex, steroids, Mucinex. Patient has primary care, was instructed to follow-up within a week.  Absent other concerning findings, including no evidence for pneumonia, or substantial lab or hemodynamic instability, the patient is appropriate for discharge.  Final Clinical Impressions(s) / ED Diagnoses  COPD exacerbation   Carmin Muskrat, MD 11/21/17 1118

## 2017-11-22 ENCOUNTER — Telehealth: Payer: Self-pay | Admitting: *Deleted

## 2017-11-22 ENCOUNTER — Other Ambulatory Visit: Payer: Self-pay | Admitting: Internal Medicine

## 2017-11-22 NOTE — Telephone Encounter (Signed)
Pharmacy called related to Rx: Xopenex needing prior auth...EDCM advised that ED does not do prior auth; pt PCP will need to complete prior auth as pt is allergic to Albuterol (causes AFib).

## 2017-11-22 NOTE — Telephone Encounter (Signed)
Patient is requesting a refill Xopenet, patient said that she went to ER cause she had an asthma attack. She is also wanting some medicine to put in her machine. Pls call patient

## 2017-11-22 NOTE — Telephone Encounter (Signed)
Patient is calling back, [name] is needing a waiver.  Pls call patient.

## 2017-11-22 NOTE — Telephone Encounter (Signed)
1)Patient requesting MD for new rx for Xopenex inhalation soln q6h PRN to be sent to [facility] (She was given xopenex inhaler to use when she's outside) She said xopenex working  well for her.  2) Requesting PA for both xopenex inhaler & neb sol'n. (provided this # to call [phone])  She's asking to be called/ updated on these requests.

## 2017-11-22 NOTE — Telephone Encounter (Signed)
Would like to speak with a nurse about meds. Please call pt back.

## 2017-11-22 NOTE — Telephone Encounter (Signed)
Patient calling back, regarding medicine.Pls contact patient

## 2017-11-23 ENCOUNTER — Telehealth: Payer: Self-pay | Admitting: *Deleted

## 2017-11-23 NOTE — Telephone Encounter (Addendum)
Call from patient's insurance provider for PA information for Xopenex.  Information was given to representative .  Awaiting decision within 1 week.  Sander Nephew, RN 11/23/3017 10:05 AM. RTC to [employer] provider PA  Information  For Xopenex was again given.  Representative could not locate previous information. [place] [phone].   Awaiting decision from Pharmacy.  Graciella Freer, RN 11/23/2017 3;34 PM.

## 2017-11-23 NOTE — Telephone Encounter (Signed)
This has been addressed in separate phone encounter from today. L. Tajia Szeliga, RN, BSN   

## 2017-11-23 NOTE — Telephone Encounter (Signed)
Patient is calling to if the nurse had a chance to talk to her physician and she is wanted the nurse to call her back, she is still waiting on her medicine

## 2017-11-23 NOTE — Telephone Encounter (Signed)
Patient called in asking for waiver and new Rx for Xopenex. Explained that at this time we are waiting to hear back from patient's insurance company whether they will approve or deny reimbursement for this med. Explained this can take up to one week. Patient will be notified once decision has been received. Patient expressed appreciation. Hubbard Hartshorn, RN, BSN

## 2017-11-23 NOTE — Telephone Encounter (Signed)
Thank you for your assistance!

## 2017-11-25 ENCOUNTER — Encounter: Payer: Self-pay | Admitting: *Deleted

## 2017-11-25 ENCOUNTER — Other Ambulatory Visit: Payer: Self-pay | Admitting: *Deleted

## 2017-11-25 NOTE — Addendum Note (Signed)
Addended byValente David on: 11/25/2017 11:14 AM   Modules accepted: Orders

## 2017-11-25 NOTE — Patient Outreach (Addendum)
[place] [facility]) Care Management  11/25/2017  [name] 30-Oct-1950 [id number]   Call placed to member to follow up on current health condition and to possibly transition to health coach, unsuccessful.  HIPAA compliant voice message left.  Will await call back, if no call back, will follow up within the next 2 weeks.     Update @ 1100:  Cal received back from member, state she is progressing well.  State she has had issues with sinuses lately (per chart, COPD exacerbation) but is now getting better.  This care manager discusses transition to health coach for further management of conditions, she agrees.  Will place referral to health coach, will send primary MD discipline closure.  [name], [place], MSN [place] [phone]

## 2017-11-28 ENCOUNTER — Ambulatory Visit (HOSPITAL_COMMUNITY): Payer: Self-pay | Admitting: Nurse Practitioner

## 2017-12-09 ENCOUNTER — Other Ambulatory Visit: Payer: Self-pay | Admitting: *Deleted

## 2017-12-09 NOTE — Patient Outreach (Signed)
[facility] Customer service manager [facility]) Care Management  12/09/2017   [name] 03/08/1951 [id number]  RN Health Coach telephone call to patient.  [employer] compliance verified. Per patient she is doing pretty good. Patient is taking her medications as per order. Patient stated she is not having to use the rescue inhaler as much only every other day. Patient is not in any pain. Per patient she gets out and walks and that is when she may get a little short of breathy but keeps her rescue  inhaler with her at all time. Patient stated her appetite is very good. Patient is able to afford her medications and has transportation to Dr visits from [facility]. Patient has history of hypertension. Patient has agreed to follow up outreach calls.   Current Medications:  Current Outpatient Medications  Medication Sig Dispense Refill  . acetaminophen (TYLENOL) 325 MG tablet Take 650 mg by mouth every 6 (six) hours as needed for mild pain.    Marland Kitchen alendronate (FOSAMAX) 70 MG tablet Take 1 tablet (70 mg total) by mouth every 7 (seven) days. Take with a full glass of water on an empty stomach. 4 tablet 11  . benzonatate (TESSALON PERLES) 100 MG capsule Take 1 capsule (100 mg total) by mouth 3 (three) times daily as needed for cough. 30 capsule 1  . Calcium Carbonate-Vitamin D (CALTRATE 600+D) 600-400 MG-UNIT tablet Take 1 tablet by mouth daily. 90 tablet 2  . CARTIA XT 120 MG 24 hr capsule TAKE 1 CAPSULE(120 MG) BY MOUTH DAILY (Patient taking differently: TAKE 1 CAPSULE(120 MG) BY MOUTH EVERY EVENING) 90 capsule 3  . cetirizine (ZYRTEC) 10 MG tablet TAKE 1 TABLET BY MOUTH EVERY MORNING 30 tablet 0  . dextromethorphan-guaiFENesin (MUCINEX DM) 30-600 MG 12hr tablet Take 1 tablet by mouth 2 (two) times daily. 20 tablet 0  . diclofenac sodium (VOLTAREN) 1 % GEL Apply 4 g topically 4 (four) times daily. 100 g 0  . diltiazem (CARDIZEM) 60 MG tablet TAKE 1 TABLET BY MOUTH EVERY 6 HOURS AS NEEDED FOR RAPID AFIB HEART RATE OVER 100 45  tablet 2  . flecainide (TAMBOCOR) 100 MG tablet Take 1 tablet (100 mg total) by mouth 2 (two) times daily. 60 tablet 6  . fluticasone (FLONASE) 50 MCG/ACT nasal spray Place 1 spray into both nostrils daily as needed for allergies or rhinitis. 16 g 0  . fluticasone (FLOVENT HFA) 44 MCG/ACT inhaler Inhale 1 puff into the lungs 2 (two) times daily. 1 Inhaler 12  . levalbuterol (XOPENEX HFA) 45 MCG/ACT inhaler Inhale 1 puff into the lungs every 6 (six) hours as needed for shortness of breath. 1 Inhaler 12  . losartan (COZAAR) 50 MG tablet Take 1 tablet (50 mg total) by mouth daily. 30 tablet 5  . magnesium hydroxide (MILK OF MAGNESIA) 400 MG/5ML suspension Take 15-30 mLs by mouth at bedtime as needed for mild constipation. 360 mL 0  . montelukast (SINGULAIR) 10 MG tablet Take 1 tablet (10 mg total) by mouth at bedtime. (Patient taking differently: Take 10 mg by mouth at bedtime as needed (allergies). ) 90 tablet 1  . Multiple Vitamin (MULTI-VITAMIN PO) Take 1 tablet by mouth daily.    Marland Kitchen OVER THE COUNTER MEDICATION Take 4 oz by mouth daily as needed (PRUNE JUICE AS NEEDED FOR CONSTIPATION RELIEF).    . polyethylene glycol powder (GLYCOLAX/MIRALAX) powder Mix 17g of Miralax in 8 ounces of water every other day (Patient taking differently: Take by mouth See admin instructions. Mix 17 g.  in 8 oz water and drink daily as needed for constipation) 255 g 0  . potassium chloride SA (K-DUR,KLOR-CON) 10 MEQ tablet Take 2 tablets (20 mEq total) by mouth daily. 60 tablet 3  . predniSONE (DELTASONE) 20 MG tablet Take 2 tablets (40 mg total) by mouth daily with breakfast. For the next four days 8 tablet 0  . ranitidine (ZANTAC) 150 MG tablet TAKE 1 TABLET BY MOUTH TWICE DAILY (Patient taking differently: TAKE 1 TABLET (150 MG) BY MOUTH TWICE DAILY AS NEEDED FOR INDIGESTION) 60 tablet 0  . rivaroxaban (XARELTO) 20 MG TABS tablet Take 1 tablet (20 mg total) by mouth daily with supper. 30 tablet 6  . simethicone (GAS-X) 80  MG chewable tablet Chew 1 tablet (80 mg total) by mouth every 6 (six) hours as needed for flatulence. 30 tablet 0  . Sodium Chloride-Sodium Bicarb (NETI POT SINUS WASH) 2300-700 MG KIT Place 1 Units into the nose 2 (two) times daily. 2 each 2  . tiotropium (SPIRIVA) 18 MCG inhalation capsule Place 1 capsule (18 mcg total) into inhaler and inhale daily. 30 capsule 2  . Vitamin D, Ergocalciferol, 2000 units CAPS Take 2,000 Units by mouth daily. 90 capsule 2   No current facility-administered medications for this visit.     Functional Status:  In your present state of health, do you have any difficulty performing the following activities: 12/09/2017 09/09/2017  Hearing? N N  Vision? N N  Difficulty concentrating or making decisions? N N  Walking or climbing stairs? N N  Comment - -  Dressing or bathing? N N  Doing errands, shopping? N N  Preparing Food and eating ? N -  Using the Toilet? N -  In the past six months, have you accidently leaked urine? N -  Do you have problems with loss of bowel control? N -  Managing your Medications? N -  Managing your Finances? N -  Housekeeping or managing your Housekeeping? N -  Some recent data might be hidden    Fall/Depression Screening: Fall Risk  12/09/2017 11/02/2017 09/09/2017  Falls in the past year? No No No  Risk for fall due to : - - -   PHQ 2/9 Scores 12/09/2017 11/02/2017 09/09/2017 09/08/2017 08/08/2017 08/08/2017 08/08/2017  PHQ - 2 Score 0 0 0 0 0 0 0   [facility] CM Care Plan Problem One     Most Recent Value  Care Plan Problem One  Knowledge Deficit in Self Management of COPD  Role Documenting the Problem One  Health Coach  Care Plan for Problem One  Active  [facility] Long Term Goal   Patient will not have any readmissions for COPD within the next 90 days  [facility] Long Term Goal Start Date  12/09/17  Interventions for Problem One Long Term Goal  RN discussed medication adherence. RN discussed keeping Dr appointments. RN discussed zones of COPD and  what to so. RN will follow up with further discussion and teach back  [facility] CM Short Term Goal #1   Patient will be able to verbalize the zones and action plan of COPD within the next 30 days  [facility] CM Short Term Goal #1 Start Date  12/09/17  Interventions for Short Term Goal #1  RN discussed the zones and action plan of COPD. RN sent patient a COPD packet. RN will follow up with further discussion and teach back  THN CM Short Term Goal #2   Patient will be able to verbalize foods low  in sodium within the next 30 days  [facility] CM Short Term Goal #2 Start Date  12/09/17  Interventions for Short Term Goal #2  RN discussed decreasing the sodium in diet. RN sent patient educational material on low sodium diet. RN sent picture chart of foods high and low in sodium.RN will follow up with further discussion and tach back.      Assessment:  Patient is not aware of COPD zones Patient takes medications as per prescribed Patient will benefit from Health Coach telephonic outreach for education and support for COPD self management. Plan: RN discussed COPD zones and action plan RN discussed medication adherence RN sent COPD packet RN sent 2019 calendar book RN sent education material on low sodium diet RN sent picture chart on low and high sodium foods RN sent blood pressure cuff RN sent barrier and assessment letter to PCP RN will follow up outreach within the month of May    Gean Maidens BSN RN [facility] Care Management [phone]

## 2017-12-15 ENCOUNTER — Encounter: Payer: Self-pay | Admitting: Internal Medicine

## 2018-01-01 ENCOUNTER — Emergency Department (HOSPITAL_COMMUNITY)
Admission: EM | Admit: 2018-01-01 | Discharge: 2018-01-01 | Disposition: A | Discharge status: Home / Self Care | Payer: Medicare Other | Attending: Emergency Medicine | Admitting: Emergency Medicine

## 2018-01-01 ENCOUNTER — Encounter (HOSPITAL_COMMUNITY): Payer: Self-pay | Admitting: Emergency Medicine

## 2018-01-01 DIAGNOSIS — T781XXA Other adverse food reactions, not elsewhere classified, initial encounter: Secondary | ICD-10-CM | POA: Diagnosis not present

## 2018-01-01 DIAGNOSIS — I10 Essential (primary) hypertension: Secondary | ICD-10-CM | POA: Insufficient documentation

## 2018-01-01 DIAGNOSIS — Z79899 Other long term (current) drug therapy: Secondary | ICD-10-CM | POA: Diagnosis not present

## 2018-01-01 DIAGNOSIS — Z7901 Long term (current) use of anticoagulants: Secondary | ICD-10-CM | POA: Insufficient documentation

## 2018-01-01 DIAGNOSIS — Z87891 Personal history of nicotine dependence: Secondary | ICD-10-CM | POA: Insufficient documentation

## 2018-01-01 DIAGNOSIS — J45909 Unspecified asthma, uncomplicated: Secondary | ICD-10-CM | POA: Insufficient documentation

## 2018-01-01 DIAGNOSIS — T7840XA Allergy, unspecified, initial encounter: Secondary | ICD-10-CM | POA: Insufficient documentation

## 2018-01-01 DIAGNOSIS — R21 Rash and other nonspecific skin eruption: Secondary | ICD-10-CM | POA: Diagnosis not present

## 2018-01-01 MED ORDER — DIPHENHYDRAMINE HCL 25 MG PO TABS: 25.0000 mg | ORAL_TABLET | Freq: Four times a day (QID) | ORAL | 0 refills | Status: DC

## 2018-01-01 MED ORDER — FAMOTIDINE 20 MG PO TABS: 20.0000 mg | ORAL_TABLET | Freq: Two times a day (BID) | ORAL | 0 refills | Status: DC

## 2018-01-01 MED ORDER — DIPHENHYDRAMINE HCL 25 MG PO CAPS
50.0000 mg | ORAL_CAPSULE | Freq: Once | ORAL | Status: AC
Start: 2018-01-01 — End: 2018-01-01
  Administered 2018-01-01: 50 mg via ORAL
  Filled 2018-01-01: qty 2

## 2018-01-01 MED ORDER — FAMOTIDINE 20 MG PO TABS
20.0000 mg | ORAL_TABLET | Freq: Once | ORAL | Status: AC
  Administered 2018-01-01: 20 mg via ORAL
  Filled 2018-01-01: qty 1

## 2018-01-01 NOTE — ED Provider Notes (Signed)
[place] [facility] EMERGENCY DEPARTMENT Provider Note   CSN: [id number] Arrival date & time: 01/01/18  0249     History   Chief Complaint Chief Complaint  Patient presents with  . Allergic Reaction    HPI MOXIE KALIL is a 67 y.o. female.  Patient presents for treatment of rash that started last night. It is described as itchy. No SOB, facial swelling or throat tightening. She reports she is allergic to tomatoes but had a small amount yesterday. No N, V.   The history is provided by the patient. No language interpreter was used.  Allergic Reaction  Presenting symptoms: rash   Presenting symptoms: no difficulty swallowing     Past Medical History:  Diagnosis Date  . A-fib ([place])   . Allergy    seasonal  . Asthma   . Chronic anticoagulation   . GERD (gastroesophageal reflux disease)   . Hypertension   . PAF (paroxysmal atrial fibrillation) ([place]) 11/26/2015   April/May 2017: Multiple ED visits for A. Fib.  02/12/16: Improved on diltiazem IV. Cardiology consulted in the ED and discharge patient with diltiazem XR 120 mg once daily with 60 mg by mouth every 6 hours as needed for symptomatic palpitations, flecainide 50 mg twice daily. Referred to the atrial fibrillation clinic.  02/17/16: Seen in the atrial fibrillation clinic by NP Roderic Palau. Normal sin    Patient Active Problem List   Diagnosis Date Noted  . Sinusitis 11/02/2017  . Hyperpigmented skin lesion 09/09/2017  . Allergic contact dermatitis 04/27/2017  . Osteoporosis without current pathological fracture 04/15/2017  . Normocytic anemia 10/19/2016  . Hyperlipidemia 03/18/2016  . Special screening for malignant neoplasms, colon 03/17/2016  . Eczema 03/17/2016  . Cervical cancer screening 03/17/2016  . Assistance with transportation 03/17/2016  . Hypertension   . GERD (gastroesophageal reflux disease)   . PAF (paroxysmal atrial fibrillation) ([place]) 11/26/2015  . Chronic anticoagulation 11/26/2015  .  Folliculitis 13/24/4010  . Marijuana abuse 06/11/2015  . Asthma, mild intermittent     Past Surgical History:  Procedure Laterality Date  . ATRIAL FIBRILLATION ABLATION N/A 09/30/2017   Procedure: ATRIAL FIBRILLATION ABLATION;  Surgeon: Thompson Grayer, MD;  Location: [facility];  Service: Cardiovascular;  Laterality: N/A;  . TONSILLECTOMY    . TUBAL LIGATION       OB History   None      Home Medications    Prior to Admission medications   Medication Sig Start Date End Date Taking? Authorizing Provider  acetaminophen (TYLENOL) 325 MG tablet Take 650 mg by mouth every 6 (six) hours as needed for mild pain.    [provider]  alendronate (FOSAMAX) 70 MG tablet Take 1 tablet (70 mg total) by mouth every 7 (seven) days. Take with a full glass of water on an empty stomach. 11/02/17 11/02/18  [name], MD  benzonatate (TESSALON PERLES) 100 MG capsule Take 1 capsule (100 mg total) by mouth 3 (three) times daily as needed for cough. 11/14/17 11/14/18  [name], MD  Calcium Carbonate-Vitamin D (CALTRATE 600+D) 600-400 MG-UNIT tablet Take 1 tablet by mouth daily. 11/02/17   [name], MD  CARTIA XT 120 MG 24 hr capsule TAKE 1 CAPSULE(120 MG) BY MOUTH DAILY Patient taking differently: TAKE 1 CAPSULE(120 MG) BY MOUTH EVERY EVENING 12/17/16   Sherran Needs, NP  cetirizine (ZYRTEC) 10 MG tablet TAKE 1 TABLET BY MOUTH EVERY MORNING 11/01/17   [name], MD  dextromethorphan-guaiFENesin [facility] DM) 30-600 MG  12hr tablet Take 1 tablet by mouth 2 (two) times daily. 11/21/17   Carmin Muskrat, MD  diclofenac sodium (VOLTAREN) 1 % GEL Apply 4 g topically 4 (four) times daily. 10/21/17   [name], MD  diltiazem (CARDIZEM) 60 MG tablet TAKE 1 TABLET BY MOUTH EVERY 6 HOURS AS NEEDED FOR RAPID AFIB HEART RATE OVER 100 06/10/17   Sherran Needs, NP  flecainide (TAMBOCOR) 100 MG tablet Take 1 tablet (100 mg total) by mouth 2 (two) times daily. 10/27/17   Sherran Needs, NP    fluticasone (FLONASE) 50 MCG/ACT nasal spray Place 1 spray into both nostrils daily as needed for allergies or rhinitis. 11/02/17   [name], MD  fluticasone (FLOVENT HFA) 44 MCG/ACT inhaler Inhale 1 puff into the lungs 2 (two) times daily. 08/08/17   Molt, Bethany, DO  levalbuterol (XOPENEX HFA) 45 MCG/ACT inhaler Inhale 1 puff into the lungs every 6 (six) hours as needed for shortness of breath. 11/21/17   Carmin Muskrat, MD  losartan (COZAAR) 50 MG tablet Take 1 tablet (50 mg total) by mouth daily. 10/21/17   [name], MD  magnesium hydroxide (MILK OF MAGNESIA) 400 MG/5ML suspension Take 15-30 mLs by mouth at bedtime as needed for mild constipation. 11/01/17   Harris, Abigail, PA-C  montelukast (SINGULAIR) 10 MG tablet Take 1 tablet (10 mg total) by mouth at bedtime. Patient taking differently: Take 10 mg by mouth at bedtime as needed (allergies).  09/09/17   [name], MD  Multiple Vitamin (MULTI-VITAMIN PO) Take 1 tablet by mouth daily.    [provider]  OVER THE COUNTER MEDICATION Take 4 oz by mouth daily as needed (PRUNE JUICE AS NEEDED FOR CONSTIPATION RELIEF).    [provider]  polyethylene glycol powder (GLYCOLAX/MIRALAX) powder Mix 17g of Miralax in 8 ounces of water every other day Patient taking differently: Take by mouth See admin instructions. Mix 17 g. in 8 oz water and drink daily as needed for constipation 07/20/17   [name], MD  potassium chloride SA (K-DUR,KLOR-CON) 10 MEQ tablet Take 2 tablets (20 mEq total) by mouth daily. 11/12/16   Sherran Needs, NP  predniSONE (DELTASONE) 20 MG tablet Take 2 tablets (40 mg total) by mouth daily with breakfast. For the next four days 11/21/17   Carmin Muskrat, MD  ranitidine (ZANTAC) 150 MG tablet TAKE 1 TABLET BY MOUTH TWICE DAILY Patient taking differently: TAKE 1 TABLET (150 MG) BY MOUTH TWICE DAILY AS NEEDED FOR INDIGESTION 03/16/17   Axel Filler, MD  rivaroxaban (XARELTO) 20 MG TABS tablet  Take 1 tablet (20 mg total) by mouth daily with supper. 10/25/17   Sherran Needs, NP  simethicone (GAS-X) 80 MG chewable tablet Chew 1 tablet (80 mg total) by mouth every 6 (six) hours as needed for flatulence. 11/01/17   Margarita Mail, PA-C  Sodium Chloride-Sodium Bicarb (NETI POT SINUS WASH) 2300-700 MG KIT Place 1 Units into the nose 2 (two) times daily. 11/02/17   [name], MD  tiotropium (SPIRIVA) 18 MCG inhalation capsule Place 1 capsule (18 mcg total) into inhaler and inhale daily. 10/21/17   [name], MD  Vitamin D, Ergocalciferol, 2000 units CAPS Take 2,000 Units by mouth daily. 11/02/17   [name], MD    Family History Family History  Problem Relation Age of Onset  . Heart attack Mother        [name] age  . Breast cancer Sister        Late  46s; now s/p mastectomy   . Lung cancer Brother        x 2  . Cancer Sister   . Cancer Sister   . Cancer Sister   . Colon cancer Neg Hx   . Colon polyps Neg Hx   . Esophageal cancer Neg Hx   . Rectal cancer Neg Hx   . Stomach cancer Neg Hx     Social History Social History   Tobacco Use  . Smoking status: Former Smoker    Last attempt to quit: 10/24/2015    Years since quitting: 2.1  . Smokeless tobacco: Never Used  Substance Use Topics  . Alcohol use: No  . Drug use: No    Types: Marijuana    Comment: Smoked marijuana in the past.     Allergies   Albuterol; Celecoxib; and Protonix [pantoprazole sodium]   Review of Systems Review of Systems  Constitutional: Negative for fever.  HENT: Negative for facial swelling, sore throat and trouble swallowing.   Respiratory: Negative for shortness of breath.   Skin: Positive for rash.     Physical Exam Updated Vital Signs BP (!) 153/108 (BP Location: Right Arm)   Pulse 77   Temp (!) 97.5 F (36.4 C) (Oral)   Resp 18   Ht 5' 7"  (1.702 m)   Wt 73.5 kg (162 lb)   SpO2 98%   BMI 25.37 kg/m   Physical Exam  Constitutional: She appears well-developed and  well-nourished.  HENT:  Head: Normocephalic.  No facial swelling or redness.   Neck: Normal range of motion. Neck supple.  Cardiovascular: Normal rate and regular rhythm.  Pulmonary/Chest: Effort normal and breath sounds normal.  Abdominal: Soft. Bowel sounds are normal. There is no tenderness. There is no rebound and no guarding.  Musculoskeletal: Normal range of motion.  Neurological: She is alert. No cranial nerve deficit.  Skin: Skin is warm and dry. No rash noted.  Red, nonraised rash to extremities with excoriation.  Psychiatric: She has a normal mood and affect.     ED Treatments / Results  Labs (all labs ordered are listed, but only abnormal results are displayed) Labs Reviewed - No data to display  EKG None  Radiology No results found.  Procedures Procedures (including critical care time)  Medications Ordered in ED Medications  diphenhydrAMINE (BENADRYL) capsule 50 mg (50 mg Oral Given 01/01/18 0332)     Initial Impression / Assessment and Plan / ED Course  I have reviewed the triage vital signs and the nursing notes.  Pertinent labs & imaging results that were available during my care of the patient were reviewed by me and considered in my medical decision making (see chart for details).     Patient here for itching rash that started after contact with known allergen. No throat tightness or SOB, c/w mild allergic reaction.   Benadryl and Pepcid indicated. Avoid allergens.  Final Clinical Impressions(s) / ED Diagnoses   Final diagnoses:  None   1. Allergic reaction   ED Discharge Orders    None       Charlann Lange, PA-C 01/01/18 [id number]    Rolland Porter, MD 01/01/18 [id number]

## 2018-01-01 NOTE — ED Triage Notes (Signed)
Reports an itchy rash all over body that started last night.  Thinks it could be from detergent but unsure.  Rash noted to arms and legs.

## 2018-01-01 NOTE — ED Notes (Signed)
Pt alert, appropriate. Ambulatory to exit

## 2018-01-02 ENCOUNTER — Ambulatory Visit: Payer: Medicare Other | Admitting: Internal Medicine

## 2018-01-02 ENCOUNTER — Other Ambulatory Visit (HOSPITAL_COMMUNITY): Payer: Self-pay | Admitting: *Deleted

## 2018-01-02 MED ORDER — DILTIAZEM HCL ER COATED BEADS 120 MG PO CP24: ORAL_CAPSULE | ORAL | 3 refills | Status: DC

## 2018-01-11 ENCOUNTER — Emergency Department (HOSPITAL_COMMUNITY): Payer: Medicare Other

## 2018-01-11 ENCOUNTER — Encounter (HOSPITAL_COMMUNITY): Payer: Self-pay

## 2018-01-11 ENCOUNTER — Emergency Department (HOSPITAL_COMMUNITY)
Admission: EM | Admit: 2018-01-11 | Discharge: 2018-01-11 | Disposition: A | Discharge status: Home / Self Care | Payer: Medicare Other | Attending: Emergency Medicine | Admitting: Emergency Medicine

## 2018-01-11 DIAGNOSIS — R109 Unspecified abdominal pain: Secondary | ICD-10-CM | POA: Diagnosis present

## 2018-01-11 DIAGNOSIS — N390 Urinary tract infection, site not specified: Secondary | ICD-10-CM

## 2018-01-11 DIAGNOSIS — J181 Lobar pneumonia, unspecified organism: Secondary | ICD-10-CM | POA: Insufficient documentation

## 2018-01-11 DIAGNOSIS — J189 Pneumonia, unspecified organism: Secondary | ICD-10-CM

## 2018-01-11 DIAGNOSIS — I10 Essential (primary) hypertension: Secondary | ICD-10-CM | POA: Diagnosis not present

## 2018-01-11 DIAGNOSIS — Z87891 Personal history of nicotine dependence: Secondary | ICD-10-CM | POA: Diagnosis not present

## 2018-01-11 DIAGNOSIS — J45909 Unspecified asthma, uncomplicated: Secondary | ICD-10-CM | POA: Diagnosis not present

## 2018-01-11 DIAGNOSIS — N200 Calculus of kidney: Secondary | ICD-10-CM | POA: Diagnosis not present

## 2018-01-11 DIAGNOSIS — Z79899 Other long term (current) drug therapy: Secondary | ICD-10-CM | POA: Diagnosis not present

## 2018-01-11 LAB — COMPREHENSIVE METABOLIC PANEL
ALK PHOS: 99 U/L (ref 38–126)
ALT: 13 U/L — ABNORMAL LOW (ref 14–54)
ANION GAP: 11 (ref 5–15)
AST: 15 U/L (ref 15–41)
Albumin: 3.9 g/dL (ref 3.5–5.0)
BILIRUBIN TOTAL: 1 mg/dL (ref 0.3–1.2)
BUN: 7 mg/dL (ref 6–20)
CALCIUM: 9.8 mg/dL (ref 8.9–10.3)
CO2: 23 mmol/L (ref 22–32)
Chloride: 104 mmol/L (ref 101–111)
Creatinine, Ser: 0.97 mg/dL (ref 0.44–1.00)
GFR calc non Af Amer: 58 mL/min — ABNORMAL LOW (ref >60)
Glucose, Bld: 124 mg/dL — ABNORMAL HIGH (ref 65–99)
Potassium: 3.3 mmol/L — ABNORMAL LOW (ref 3.5–5.1)
Sodium: 138 mmol/L (ref 135–145)
TOTAL PROTEIN: 7.7 g/dL (ref 6.5–8.1)

## 2018-01-11 LAB — URINALYSIS, ROUTINE W REFLEX MICROSCOPIC
Bilirubin Urine: NEGATIVE
GLUCOSE, UA: NEGATIVE mg/dL
KETONES UR: NEGATIVE mg/dL
NITRITE: NEGATIVE
PH: 5 (ref 5.0–8.0)
Protein, ur: 100 mg/dL — AB
SPECIFIC GRAVITY, URINE: 1.02 (ref 1.005–1.030)

## 2018-01-11 LAB — CBC
HCT: 42 % (ref 36.0–46.0)
HEMOGLOBIN: 13.7 g/dL (ref 12.0–15.0)
MCH: 25.8 pg — ABNORMAL LOW (ref 26.0–34.0)
MCHC: 32.6 g/dL (ref 30.0–36.0)
MCV: 78.9 fL (ref 78.0–100.0)
PLATELETS: 254 10*3/uL (ref 150–400)
RBC: 5.32 MIL/uL — AB (ref 3.87–5.11)
RDW: 15.5 % (ref 11.5–15.5)
WBC: 7.8 10*3/uL (ref 4.0–10.5)

## 2018-01-11 LAB — WET PREP, GENITAL
SPERM: NONE SEEN
Trich, Wet Prep: NONE SEEN
YEAST WET PREP: NONE SEEN

## 2018-01-11 LAB — LIPASE, BLOOD: Lipase: 28 U/L (ref 11–51)

## 2018-01-11 LAB — HIV ANTIBODY (ROUTINE TESTING W REFLEX): HIV Screen 4th Generation wRfx: NONREACTIVE

## 2018-01-11 LAB — RPR: RPR Ser Ql: NONREACTIVE

## 2018-01-11 IMAGING — CR DG CHEST 2V
2 series · 2 of 2 positions shown · non-contrast
Comparison: February 10, 2016

CLINICAL DATA: Tachycardia.

EXAM:
CHEST  2 VIEW

[chest pa]
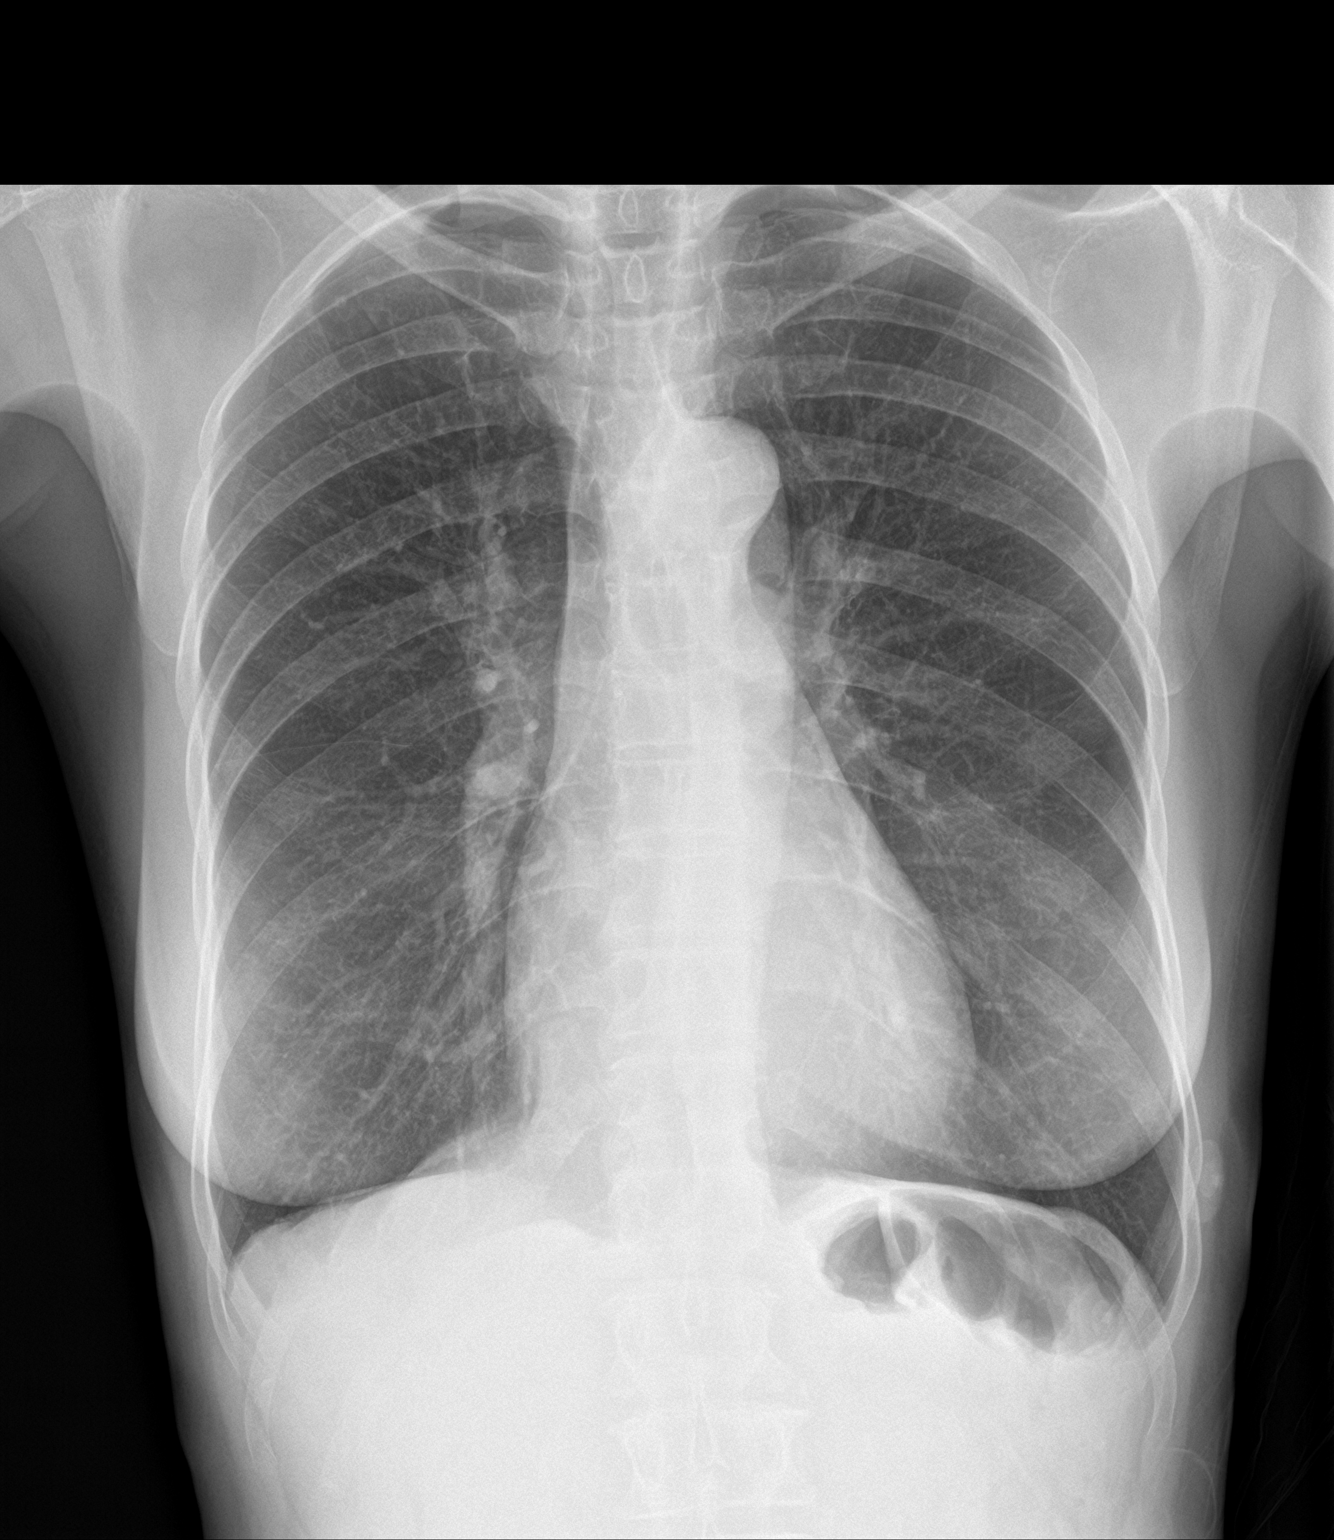

[chest lat]
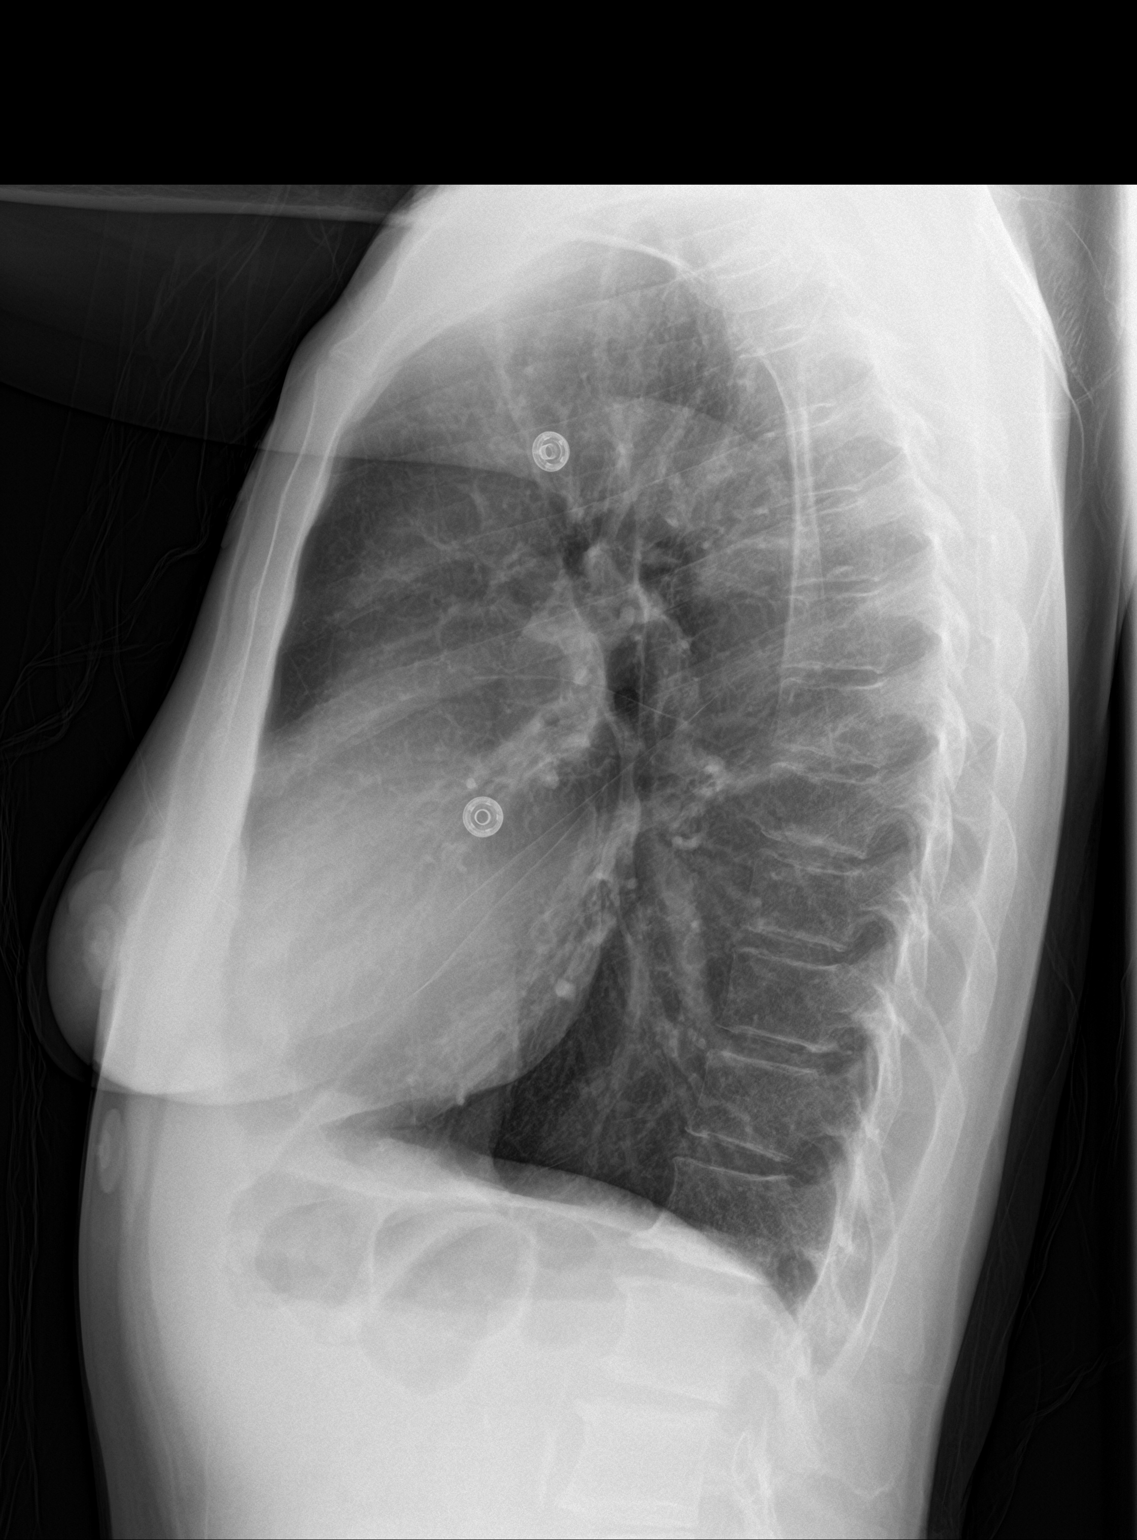

[2 of 2 positions shown; findings below may reference images not displayed]

FINDINGS: The heart size and mediastinal contours are within normal limits.
Both lungs are clear. The visualized skeletal structures are
unremarkable.
IMPRESSION: No active cardiopulmonary disease.

## 2018-01-11 MED ORDER — CLOTRIMAZOLE 1 % EX CREA: TOPICAL_CREAM | CUTANEOUS | 0 refills | Status: DC

## 2018-01-11 MED ORDER — FAMOTIDINE 20 MG PO TABS: 20.0000 mg | ORAL_TABLET | Freq: Two times a day (BID) | ORAL | 0 refills | Status: DC

## 2018-01-11 MED ORDER — AZITHROMYCIN 250 MG PO TABS
500.0000 mg | ORAL_TABLET | Freq: Once | ORAL | Status: AC
  Administered 2018-01-11: 500 mg via ORAL
  Filled 2018-01-11: qty 2

## 2018-01-11 MED ORDER — AZITHROMYCIN 250 MG PO TABS: 250.0000 mg | ORAL_TABLET | Freq: Every day | ORAL | 0 refills | Status: DC

## 2018-01-11 MED ORDER — AMOXICILLIN-POT CLAVULANATE 875-125 MG PO TABS
1.0000 | ORAL_TABLET | Freq: Once | ORAL | Status: AC
Start: 2018-01-11 — End: 2018-01-11
  Administered 2018-01-11: 1 via ORAL
  Filled 2018-01-11: qty 1

## 2018-01-11 MED ORDER — SUCRALFATE 1 GM/10ML PO SUSP: 1.0000 g | Freq: Three times a day (TID) | ORAL | 0 refills | Status: DC

## 2018-01-11 MED ORDER — FAMOTIDINE 20 MG PO TABS
40.0000 mg | ORAL_TABLET | Freq: Once | ORAL | Status: AC
Start: 2018-01-11 — End: 2018-01-11
  Administered 2018-01-11: 40 mg via ORAL
  Filled 2018-01-11: qty 2

## 2018-01-11 MED ORDER — GI COCKTAIL ~~LOC~~
30.0000 mL | Freq: Once | ORAL | Status: AC
  Administered 2018-01-11: 30 mL via ORAL
  Filled 2018-01-11: qty 30

## 2018-01-11 MED ORDER — AMOXICILLIN-POT CLAVULANATE 875-125 MG PO TABS: 1.0000 | ORAL_TABLET | Freq: Two times a day (BID) | ORAL | 0 refills | Status: DC

## 2018-01-11 MED ORDER — HYDROCODONE-ACETAMINOPHEN 5-325 MG PO TABS: 1.0000 | ORAL_TABLET | ORAL | 0 refills | Status: DC | PRN

## 2018-01-11 NOTE — ED Notes (Signed)
Pt given [place] sandwich, applesauce, graham crackers, peanut butter, and ginger ale to drink per MD post pelvic exam.

## 2018-01-11 NOTE — ED Triage Notes (Signed)
Pt states that for the past three days she has been having upper abd pain with nausea, denies diarrhea or fevers, denies CP, states she took antiacids without relief.

## 2018-01-11 NOTE — ED Notes (Signed)
Pt ambulated to bathroom without issue. Complained of a headache, but stated it was attributed to her HBP and being hungry. MD okayed Pt to eat after Pelvic Exam is performed.

## 2018-01-11 NOTE — Discharge Instructions (Signed)
.    1.  It is very important that you follow-up with your family doctor.  Make them aware that you had tests done at the emergency department that they should review.  Your urine culture and CT scan should be reviewed by your family doctor.  It is important that they determined that all of your symptoms have resolved with treatment and no further testing is needed. 2.  Also schedule a follow-up appointment with your gynecologist.

## 2018-01-11 NOTE — ED Provider Notes (Signed)
[facility] EMERGENCY DEPARTMENT Provider Note   CSN: [id number] Arrival date & time: 01/11/18  4098     History   Chief Complaint Chief Complaint  Patient presents with  . Abdominal Pain    HPI [name] is a 67 y.o. female.  HPI Patient reports 3 days of abdominal pain.  Quality has been burning in the epigastrium and central abdomen.  Patient has tried Zantac and Tums without relief.  No vomiting no diarrhea.  Patient reports she has been having regular bowel movements.  She states she has been drinking prune juice and does not feel constipated.  No pain burning urgency with urination.  No vaginal discharge or bleeding.  She reports she has had reflux once before and had similar pain.  Patient is sexually active intermittently.  Denies bleeding or discharge.  Patient denies it is worse or better with eating but reports she has not been eating much.  Denies additional pain with cough or laugh or movement. Past Medical History:  Diagnosis Date  . A-fib ([place])   . Allergy    seasonal  . Asthma   . Chronic anticoagulation   . GERD (gastroesophageal reflux disease)   . Hypertension   . PAF (paroxysmal atrial fibrillation) ([place]) 11/26/2015   April/May 2017: Multiple ED visits for A. Fib.  02/12/16: Improved on diltiazem IV. Cardiology consulted in the ED and discharge patient with diltiazem XR 120 mg once daily with 60 mg by mouth every 6 hours as needed for symptomatic palpitations, flecainide 50 mg twice daily. Referred to the atrial fibrillation clinic.  02/17/16: Seen in the atrial fibrillation clinic by NP Roderic Palau. Normal sin    Patient Active Problem List   Diagnosis Date Noted  . Sinusitis 11/02/2017  . Hyperpigmented skin lesion 09/09/2017  . Allergic contact dermatitis 04/27/2017  . Osteoporosis without current pathological fracture 04/15/2017  . Normocytic anemia 10/19/2016  . Hyperlipidemia 03/18/2016  . Special screening for malignant  neoplasms, colon 03/17/2016  . Eczema 03/17/2016  . Cervical cancer screening 03/17/2016  . Assistance with transportation 03/17/2016  . Hypertension   . GERD (gastroesophageal reflux disease)   . PAF (paroxysmal atrial fibrillation) ([place]) 11/26/2015  . Chronic anticoagulation 11/26/2015  . Folliculitis 11/91/4782  . Marijuana abuse 06/11/2015  . Asthma, mild intermittent     Past Surgical History:  Procedure Laterality Date  . ATRIAL FIBRILLATION ABLATION N/A 09/30/2017   Procedure: ATRIAL FIBRILLATION ABLATION;  Surgeon: Thompson Grayer, MD;  Location: [facility];  Service: Cardiovascular;  Laterality: N/A;  . TONSILLECTOMY    . TUBAL LIGATION       OB History   None      Home Medications    Prior to Admission medications   Medication Sig Start Date End Date Taking? Authorizing Provider  acetaminophen (TYLENOL) 325 MG tablet Take 650 mg by mouth every 6 (six) hours as needed for mild pain.   Yes [provider]  benzonatate (TESSALON PERLES) 100 MG capsule Take 1 capsule (100 mg total) by mouth 3 (three) times daily as needed for cough. 11/14/17 11/14/18 Yes [name], MD  bismuth subsalicylate (PEPTO BISMOL) 262 MG/15ML suspension Take 30 mLs by mouth every 6 (six) hours as needed for indigestion.   Yes [provider]  Calcium Carbonate-Vitamin D (CALTRATE 600+D) 600-400 MG-UNIT tablet Take 1 tablet by mouth daily. 11/02/17  Yes [name], MD  cetirizine (ZYRTEC) 10 MG tablet TAKE 1 TABLET BY MOUTH EVERY MORNING 11/01/17  Yes [name], MD  dextromethorphan-guaiFENesin [facility] DM) 30-600 MG 12hr tablet Take 1 tablet by mouth 2 (two) times daily. 11/21/17  Yes Carmin Muskrat, MD  diltiazem (CARTIA XT) 120 MG 24 hr capsule TAKE 1 CAPSULE(120 MG) BY MOUTH EVERY EVENING 01/02/18  Yes Sherran Needs, NP  diphenhydrAMINE (BENADRYL) 25 MG tablet Take 1 tablet (25 mg total) by mouth every 6 (six) hours. Take regularly for 3 days then as needed if rash  comes back. 01/01/18  Yes Charlann Lange, PA-C  flecainide (TAMBOCOR) 100 MG tablet Take 1 tablet (100 mg total) by mouth 2 (two) times daily. 10/27/17  Yes Sherran Needs, NP  fluticasone (FLOVENT HFA) 44 MCG/ACT inhaler Inhale 1 puff into the lungs 2 (two) times daily. 08/08/17  Yes Molt, Bethany, DO  levalbuterol (XOPENEX HFA) 45 MCG/ACT inhaler Inhale 1 puff into the lungs every 6 (six) hours as needed for shortness of breath. 11/21/17  Yes Carmin Muskrat, MD  losartan (COZAAR) 50 MG tablet Take 1 tablet (50 mg total) by mouth daily. 10/21/17  Yes [name], MD  magnesium hydroxide (MILK OF MAGNESIA) 400 MG/5ML suspension Take 15-30 mLs by mouth at bedtime as needed for mild constipation. 11/01/17  Yes Harris, Abigail, PA-C  montelukast (SINGULAIR) 10 MG tablet Take 1 tablet (10 mg total) by mouth at bedtime. Patient taking differently: Take 10 mg by mouth at bedtime as needed (allergies).  09/09/17  Yes [name], MD  Multiple Vitamin (MULTI-VITAMIN PO) Take 1 tablet by mouth daily.   Yes [provider]  OVER THE COUNTER MEDICATION Take 4 oz by mouth daily as needed (PRUNE JUICE AS NEEDED FOR CONSTIPATION RELIEF).   Yes [provider]  polyethylene glycol powder (GLYCOLAX/MIRALAX) powder Mix 17g of Miralax in 8 ounces of water every other day Patient taking differently: Take by mouth See admin instructions. Mix 17 g. in 8 oz water and drink daily as needed for constipation 07/20/17  Yes [name], MD  potassium chloride SA (K-DUR,KLOR-CON) 10 MEQ tablet Take 2 tablets (20 mEq total) by mouth daily. 11/12/16  Yes Sherran Needs, NP  rivaroxaban (XARELTO) 20 MG TABS tablet Take 1 tablet (20 mg total) by mouth daily with supper. 10/25/17  Yes Sherran Needs, NP  simethicone (GAS-X) 80 MG chewable tablet Chew 1 tablet (80 mg total) by mouth every 6 (six) hours as needed for flatulence. 11/01/17  Yes Harris, Abigail, PA-C  Sodium Chloride-Sodium Bicarb (NETI POT SINUS [place])  2300-700 MG KIT Place 1 Units into the nose 2 (two) times daily. 11/02/17  Yes [name], MD  tiotropium (SPIRIVA) 18 MCG inhalation capsule Place 1 capsule (18 mcg total) into inhaler and inhale daily. 10/21/17  Yes [name], MD  Vitamin D, Ergocalciferol, 2000 units CAPS Take 2,000 Units by mouth daily. 11/02/17  Yes [name], MD  alendronate (FOSAMAX) 70 MG tablet Take 1 tablet (70 mg total) by mouth every 7 (seven) days. Take with a full glass of water on an empty stomach. Patient not taking: Reported on 01/11/2018 11/02/17 11/02/18  [name], MD  amoxicillin-clavulanate (AUGMENTIN) 875-125 MG tablet Take 1 tablet by mouth 2 (two) times daily. One po bid x 7 days 01/11/18   Charlesetta Shanks, MD  azithromycin (ZITHROMAX) 250 MG tablet Take 1 tablet (250 mg total) by mouth daily. 01/11/18   Charlesetta Shanks, MD  clotrimazole (LOTRIMIN) 1 % cream Apply to affected area 2 times daily 01/11/18   Charlesetta Shanks, MD  diclofenac sodium (VOLTAREN)  1 % GEL Apply 4 g topically 4 (four) times daily. Patient not taking: Reported on 01/11/2018 10/21/17   [name], MD  diltiazem (CARDIZEM) 60 MG tablet TAKE 1 TABLET BY MOUTH EVERY 6 HOURS AS NEEDED FOR RAPID AFIB HEART RATE OVER 100 Patient not taking: Reported on 01/11/2018 06/10/17   Sherran Needs, NP  famotidine (PEPCID) 20 MG tablet Take 1 tablet (20 mg total) by mouth 2 (two) times daily. Take regularly for 3 days then as needed if rash comes back. Patient not taking: Reported on 01/11/2018 01/01/18   Charlann Lange, PA-C  famotidine (PEPCID) 20 MG tablet Take 1 tablet (20 mg total) by mouth 2 (two) times daily. 01/11/18   Charlesetta Shanks, MD  fluticasone (FLONASE) 50 MCG/ACT nasal spray Place 1 spray into both nostrils daily as needed for allergies or rhinitis. Patient not taking: Reported on 01/11/2018 11/02/17   [name], MD  HYDROcodone-acetaminophen (NORCO/VICODIN) 5-325 MG tablet Take 1-2 tablets by mouth every 4 (four) hours as needed  for moderate pain or severe pain. 01/11/18   Charlesetta Shanks, MD  predniSONE (DELTASONE) 20 MG tablet Take 2 tablets (40 mg total) by mouth daily with breakfast. For the next four days Patient not taking: Reported on 01/11/2018 11/21/17   Carmin Muskrat, MD  ranitidine (ZANTAC) 150 MG tablet TAKE 1 TABLET BY MOUTH TWICE DAILY Patient taking differently: TAKE 1 TABLET (150 MG) BY MOUTH TWICE DAILY AS NEEDED FOR INDIGESTION 03/16/17   Axel Filler, MD  sucralfate (CARAFATE) 1 GM/10ML suspension Take 10 mLs (1 g total) by mouth 4 (four) times daily -  with meals and at bedtime. 01/11/18   Charlesetta Shanks, MD    Family History Family History  Problem Relation Age of Onset  . Heart attack Mother        [name] age  . Breast cancer Sister        Late 47s; now s/p mastectomy   . Lung cancer Brother        x 2  . Cancer Sister   . Cancer Sister   . Cancer Sister   . Colon cancer Neg Hx   . Colon polyps Neg Hx   . Esophageal cancer Neg Hx   . Rectal cancer Neg Hx   . Stomach cancer Neg Hx     Social History Social History   Tobacco Use  . Smoking status: Former Smoker    Last attempt to quit: 10/24/2015    Years since quitting: 2.2  . Smokeless tobacco: Never Used  Substance Use Topics  . Alcohol use: No  . Drug use: No    Types: Marijuana    Comment: Smoked marijuana in the past.     Allergies   Albuterol; Celecoxib; and Protonix [pantoprazole sodium]   Review of Systems Review of Systems 10 Systems reviewed and are negative for acute change except as noted in the HPI.   Physical Exam Updated Vital Signs BP (!) 159/91   Pulse 78   Temp 98.3 F (36.8 C) (Oral)   Resp 20   Ht 5' 7"  (1.702 m)   Wt 74.8 kg (165 lb)   SpO2 96%   BMI 25.84 kg/m   Physical Exam  Constitutional: She is oriented to person, place, and time. She appears well-developed and well-nourished.  HENT:  Head: Normocephalic and atraumatic.  Eyes: Conjunctivae and EOM are normal.  Neck:  Neck supple.  Cardiovascular: Normal rate, regular rhythm, normal heart sounds and intact distal pulses.  Pulmonary/Chest: Effort normal and breath sounds normal.  Abdominal: Soft. Bowel sounds are normal. She exhibits no distension. There is no tenderness.  Patient's abdomen has minimally reproducible tenderness.  This is generally central and periumbilical.  No guarding.  No palpable mass.  Genitourinary:  Genitourinary Comments: Normal external female genitalia.  Moderate whitish-yellow discharge in the vaginal vault.  Cervix nonfriable.  General tenderness to palpation of pelvic organs.  No cervical motion tenderness or adnexal tenderness.  Musculoskeletal: Normal range of motion. She exhibits no edema or tenderness.  Neurological: She is alert and oriented to person, place, and time. She has normal strength. Coordination normal. GCS eye subscore is 4. GCS verbal subscore is 5. GCS motor subscore is 6.  Skin: Skin is warm, dry and intact.  Patient has 10 x 8 cm patch of dry scaling rash on her right anterior tibial surface.  (She reports this is been chronic for months.  She reports she is seeing dermatology and been given a cream which has not helped.  It is often pruritic.)  Psychiatric: She has a normal mood and affect.     ED Treatments / Results  Labs (all labs ordered are listed, but only abnormal results are displayed) Labs Reviewed  WET PREP, GENITAL - Abnormal; Notable for the following components:      Result Value   Clue Cells Wet Prep HPF POC PRESENT (*)    WBC, Wet Prep HPF POC MANY (*)    All other components within normal limits  COMPREHENSIVE METABOLIC PANEL - Abnormal; Notable for the following components:   Potassium 3.3 (*)    Glucose, Bld 124 (*)    ALT 13 (*)    GFR calc non Af Amer 58 (*)    All other components within normal limits  CBC - Abnormal; Notable for the following components:   RBC 5.32 (*)    MCH 25.8 (*)    All other components within normal  limits  URINALYSIS, ROUTINE W REFLEX MICROSCOPIC - Abnormal; Notable for the following components:   Color, Urine AMBER (*)    APPearance CLOUDY (*)    Hgb urine dipstick MODERATE (*)    Protein, ur 100 (*)    Leukocytes, UA SMALL (*)    Bacteria, UA MANY (*)    All other components within normal limits  LIPASE, BLOOD  RPR  HIV ANTIBODY (ROUTINE TESTING)  GC/CHLAMYDIA PROBE AMP ([place]) NOT AT [facility]    EKG EKG Interpretation  Date/Time:  Wednesday Jan 11 2018 07:09:53 EDT Ventricular Rate:  86 PR Interval:    QRS Duration: 95 QT Interval:  388 QTC Calculation: 465 R Axis:   48 Text Interpretation:  Sinus rhythm Ventricular premature complex RSR' in V1 or V2, probably normal variant agree. no change from previous Confirmed by Charlesetta Shanks [id number]) on 01/11/2018 7:24:49 AM Also confirmed by Charlesetta Shanks [phone]), editor Lynder Parents [phone])  on 01/11/2018 9:21:45 AM   Radiology Ct Renal Stone Study  Result Date: 01/11/2018 CLINICAL DATA:  Upper abdominal pain with nausea.  Hematuria. EXAM: CT ABDOMEN AND PELVIS WITHOUT CONTRAST TECHNIQUE: Multidetector CT imaging of the abdomen and pelvis was performed following the standard protocol without IV contrast. COMPARISON:  CT scan of the abdomen dated 01/23/2013 and CT scans of the chest dated 09/28/2017 and 05/29/2017 and abdominal radiograph dated 11/01/2017 FINDINGS: Lower chest: There is new marked peribronchial thickening in the right lower lobe with small patchy areas of inflammatory disease. Increased peribronchial thickening in the left  lower lobe. Bilateral bronchiectasis. Stable 4 mm nodule in the right lower lobe. Heart size is normal. Small unchanged hiatal hernia. Hepatobiliary: Multiple stable simple cysts in the dome of the liver. Liver parenchyma is otherwise normal. Biliary tree is normal. Pancreas: Unremarkable. No pancreatic ductal dilatation or surrounding inflammatory changes. Spleen: Normal in size without focal  abnormality. Adrenals/Urinary Tract: Adrenal glands are unremarkable. 1.5 mm stone in the mid right kidney seen on image 51 of series 6 and image 31 of series 7. The kidneys otherwise appear normal. No hydronephrosis. No ureteral or bladder calculi. Bladder is unremarkable. Stomach/Bowel: Stomach is within normal limits. Appendix appears normal. No evidence of bowel wall thickening, distention, or inflammatory changes. Vascular/Lymphatic: Aortic atherosclerosis. No enlarged abdominal or pelvic lymph nodes. Reproductive: Uterus and bilateral adnexa are unremarkable. Other: No abdominal wall hernia or abnormality. No abdominopelvic ascites. Musculoskeletal: No acute abnormalities. Multilevel degenerative disc disease in the lumbar spine. IMPRESSION: 1. No acute abnormality of the abdomen or pelvis. 2. Tiny nonobstructing stone in the mid right kidney. 3. Small hiatal hernia. 4. Prominent peribronchial thickening in the lower lobes, right greater than left with patchy small areas of new inflammation in the right lower lobe. Mild bibasilar bronchiectasis. 5.  Aortic Atherosclerosis (ICD10-I70.0). Electronically Signed   By: Lorriane Shire M.D.   On: 01/11/2018 09:19    Procedures Procedures (including critical care time)  Medications Ordered in ED Medications  famotidine (PEPCID) tablet 40 mg (has no administration in time range)  amoxicillin-clavulanate (AUGMENTIN) 875-125 MG per tablet 1 tablet (has no administration in time range)  azithromycin (ZITHROMAX) tablet 500 mg (has no administration in time range)  gi cocktail (Maalox,Lidocaine,Donnatal) (30 mLs Oral Given 01/11/18 0725)     Initial Impression / Assessment and Plan / ED Course  I have reviewed the triage vital signs and the nursing notes.  Pertinent labs & imaging results that were available during my care of the patient were reviewed by me and considered in my medical decision making (see chart for details).      Final Clinical  Impressions(s) / ED Diagnoses   Final diagnoses:  Lower urinary tract infectious disease  Community acquired pneumonia of right lower lobe of lung ([place])  Patient presents with abdominal pain.  It is somewhat vague in terms of associated symptoms.  Clinically, patient does not have ill appearance.  Urinalysis however was positive for blood and white cells.  Specimen is moderately contaminated however patient is not having any vaginal bleeding and there is no good explanation for blood in the urine for 67 year old female.  CT abdomen pelvis obtained to rule out kidney stone.  None present.  No acute findings identified on CT however CT does identify appearance of infiltrate in right lung base.  Patient does not describe symptoms very suggestive of pneumonia however with vague abdominal pain this may be the etiology.  Treatment is initiated with combination of Augmentin and Zithromax.  This should be effective in covering a community-acquired pneumonia as well as UTI based on again, vague central abdominal pain with blood in the urine.  Patient also has a strong description of GERD type symptoms.  Will recommend 2 weeks of consistent Pepcid.  Patient requests treatment for a scaling fairly large oval patch of dermatitis on her lower leg.  This is been fairly chronic in nature.  She reports the dermatologist gave her creams to no avail.  No signs of associated cellulitis or secondary infection.  This is not vesicular.  As a  trial, will provide her with miconazole to see if this has any impact.  Patient is counseled on the necessity of close follow-up with her primary care provider to help coordinate her various findings today and treatments to see if this is effective or any further evaluations are indicated.  ED Discharge Orders        Ordered    amoxicillin-clavulanate (AUGMENTIN) 875-125 MG tablet  2 times daily     01/11/18 1250    azithromycin (ZITHROMAX) 250 MG tablet  Daily     01/11/18 1250     famotidine (PEPCID) 20 MG tablet  2 times daily     01/11/18 1250    sucralfate (CARAFATE) 1 GM/10ML suspension  3 times daily with meals & bedtime     01/11/18 1250    HYDROcodone-acetaminophen (NORCO/VICODIN) 5-325 MG tablet  Every 4 hours PRN     01/11/18 1250    clotrimazole (LOTRIMIN) 1 % cream     01/11/18 1256       Charlesetta Shanks, MD 01/11/18 1303

## 2018-01-11 NOTE — ED Notes (Signed)
Care assumed at time - resting quietly on stretcher without complaints - pelvic cart placed in room per NT - pt aware of impending pelvic exam

## 2018-01-12 LAB — GC/CHLAMYDIA PROBE AMP (~~LOC~~) NOT AT ARMC
Chlamydia: NEGATIVE
Neisseria Gonorrhea: NEGATIVE

## 2018-01-14 ENCOUNTER — Emergency Department (HOSPITAL_COMMUNITY)
Admission: EM | Admit: 2018-01-14 | Discharge: 2018-01-14 | Disposition: A | Discharge status: Home / Self Care | Payer: Medicare Other | Attending: Emergency Medicine | Admitting: Emergency Medicine

## 2018-01-14 ENCOUNTER — Encounter (HOSPITAL_COMMUNITY): Payer: Self-pay | Admitting: Emergency Medicine

## 2018-01-14 ENCOUNTER — Other Ambulatory Visit: Payer: Self-pay

## 2018-01-14 DIAGNOSIS — Z7901 Long term (current) use of anticoagulants: Secondary | ICD-10-CM | POA: Insufficient documentation

## 2018-01-14 DIAGNOSIS — R1115 Cyclical vomiting syndrome unrelated to migraine: Secondary | ICD-10-CM

## 2018-01-14 DIAGNOSIS — R109 Unspecified abdominal pain: Secondary | ICD-10-CM

## 2018-01-14 DIAGNOSIS — G43A0 Cyclical vomiting, not intractable: Secondary | ICD-10-CM | POA: Insufficient documentation

## 2018-01-14 DIAGNOSIS — Z79899 Other long term (current) drug therapy: Secondary | ICD-10-CM | POA: Diagnosis not present

## 2018-01-14 DIAGNOSIS — J45909 Unspecified asthma, uncomplicated: Secondary | ICD-10-CM | POA: Diagnosis not present

## 2018-01-14 DIAGNOSIS — I10 Essential (primary) hypertension: Secondary | ICD-10-CM | POA: Diagnosis not present

## 2018-01-14 DIAGNOSIS — R1084 Generalized abdominal pain: Secondary | ICD-10-CM | POA: Diagnosis not present

## 2018-01-14 LAB — URINALYSIS, ROUTINE W REFLEX MICROSCOPIC
Bilirubin Urine: NEGATIVE
GLUCOSE, UA: NEGATIVE mg/dL
HGB URINE DIPSTICK: NEGATIVE
Ketones, ur: 5 mg/dL — AB
NITRITE: NEGATIVE
Protein, ur: 30 mg/dL — AB
SPECIFIC GRAVITY, URINE: 1.021 (ref 1.005–1.030)
pH: 7 (ref 5.0–8.0)

## 2018-01-14 LAB — COMPREHENSIVE METABOLIC PANEL
ALBUMIN: 3.7 g/dL (ref 3.5–5.0)
ALK PHOS: 87 U/L (ref 38–126)
ALT: 15 U/L (ref 14–54)
AST: 24 U/L (ref 15–41)
Anion gap: 10 (ref 5–15)
BILIRUBIN TOTAL: 0.8 mg/dL (ref 0.3–1.2)
BUN: 10 mg/dL (ref 6–20)
CALCIUM: 9.4 mg/dL (ref 8.9–10.3)
CO2: 26 mmol/L (ref 22–32)
Chloride: 103 mmol/L (ref 101–111)
Creatinine, Ser: 0.84 mg/dL (ref 0.44–1.00)
GFR calc Af Amer: >60 mL/min (ref >60)
GFR calc non Af Amer: >60 mL/min (ref >60)
GLUCOSE: 98 mg/dL (ref 65–99)
Potassium: 3.9 mmol/L (ref 3.5–5.1)
Sodium: 139 mmol/L (ref 135–145)
TOTAL PROTEIN: 7.2 g/dL (ref 6.5–8.1)

## 2018-01-14 LAB — CBC
HCT: 41.1 % (ref 36.0–46.0)
Hemoglobin: 13.1 g/dL (ref 12.0–15.0)
MCH: 26 pg (ref 26.0–34.0)
MCHC: 31.9 g/dL (ref 30.0–36.0)
MCV: 81.7 fL (ref 78.0–100.0)
Platelets: 283 10*3/uL (ref 150–400)
RBC: 5.03 MIL/uL (ref 3.87–5.11)
RDW: 15.3 % (ref 11.5–15.5)
WBC: 4.5 10*3/uL (ref 4.0–10.5)

## 2018-01-14 LAB — LIPASE, BLOOD: Lipase: 26 U/L (ref 11–51)

## 2018-01-14 MED ORDER — GI COCKTAIL ~~LOC~~
30.0000 mL | Freq: Once | ORAL | Status: AC
Start: 2018-01-14 — End: 2018-01-14
  Administered 2018-01-14: 30 mL via ORAL
  Filled 2018-01-14: qty 30

## 2018-01-14 MED ORDER — ONDANSETRON HCL 4 MG/2ML IJ SOLN
4.0000 mg | Freq: Once | INTRAMUSCULAR | Status: AC
Start: 2018-01-14 — End: 2018-01-14
  Administered 2018-01-14: 4 mg via INTRAVENOUS
  Filled 2018-01-14: qty 2

## 2018-01-14 MED ORDER — DICYCLOMINE HCL 20 MG PO TABS: 20.0000 mg | ORAL_TABLET | Freq: Two times a day (BID) | ORAL | 0 refills | Status: DC

## 2018-01-14 MED ORDER — ONDANSETRON 4 MG PO TBDP: 4.0000 mg | ORAL_TABLET | Freq: Three times a day (TID) | ORAL | 0 refills | Status: AC | PRN

## 2018-01-14 MED ORDER — DICYCLOMINE HCL 10 MG PO CAPS
10.0000 mg | ORAL_CAPSULE | Freq: Once | ORAL | Status: AC
  Administered 2018-01-14: 10 mg via ORAL
  Filled 2018-01-14: qty 1

## 2018-01-14 MED ORDER — SODIUM CHLORIDE 0.9 % IV BOLUS
1000.0000 mL | Freq: Once | INTRAVENOUS | Status: AC
  Administered 2018-01-14: 1000 mL via INTRAVENOUS

## 2018-01-14 NOTE — ED Provider Notes (Signed)
[facility] EMERGENCY DEPARTMENT Provider Note  CSN: [id number] Arrival date & time: 01/14/18 1153  Chief Complaint(s) Abdominal Pain  HPI [name] is a 67 y.o. female   The history is provided by the patient.  Abdominal Pain   This is a recurrent problem. Episode onset: several days. Episode frequency: intermittent. The problem has been resolved. Associated with: recently diagnosed with PNA and possible UTI 3 days ago. The pain is located in the generalized abdominal region. The quality of the pain is aching and cramping. The pain is moderate. Associated symptoms include nausea and vomiting (NBNB today). Pertinent negatives include fever, diarrhea, hematochezia, melena, constipation and dysuria. Nothing aggravates the symptoms. Nothing relieves the symptoms.    Past Medical History Past Medical History:  Diagnosis Date  . A-fib ([place])   . Allergy    seasonal  . Asthma   . Chronic anticoagulation   . GERD (gastroesophageal reflux disease)   . Hypertension   . PAF (paroxysmal atrial fibrillation) ([place]) 11/26/2015   April/May 2017: Multiple ED visits for A. Fib.  02/12/16: Improved on diltiazem IV. Cardiology consulted in the ED and discharge patient with diltiazem XR 120 mg once daily with 60 mg by mouth every 6 hours as needed for symptomatic palpitations, flecainide 50 mg twice daily. Referred to the atrial fibrillation clinic.  02/17/16: Seen in the atrial fibrillation clinic by NP Roderic Palau. Normal sin   Patient Active Problem List   Diagnosis Date Noted  . Sinusitis 11/02/2017  . Hyperpigmented skin lesion 09/09/2017  . Allergic contact dermatitis 04/27/2017  . Osteoporosis without current pathological fracture 04/15/2017  . Normocytic anemia 10/19/2016  . Hyperlipidemia 03/18/2016  . Special screening for malignant neoplasms, colon 03/17/2016  . Eczema 03/17/2016  . Cervical cancer screening 03/17/2016  . Assistance with transportation 03/17/2016    . Hypertension   . GERD (gastroesophageal reflux disease)   . PAF (paroxysmal atrial fibrillation) ([place]) 11/26/2015  . Chronic anticoagulation 11/26/2015  . Folliculitis 23/36/1224  . Marijuana abuse 06/11/2015  . Asthma, mild intermittent    Home Medication(s) Prior to Admission medications   Medication Sig Start Date End Date Taking? Authorizing Provider  acetaminophen (TYLENOL) 325 MG tablet Take 650 mg by mouth every 6 (six) hours as needed for mild pain.    [provider]  alendronate (FOSAMAX) 70 MG tablet Take 1 tablet (70 mg total) by mouth every 7 (seven) days. Take with a full glass of water on an empty stomach. Patient not taking: Reported on 01/11/2018 11/02/17 11/02/18  [name], MD  amoxicillin-clavulanate (AUGMENTIN) 875-125 MG tablet Take 1 tablet by mouth 2 (two) times daily. One po bid x 7 days 01/11/18   Charlesetta Shanks, MD  azithromycin (ZITHROMAX) 250 MG tablet Take 1 tablet (250 mg total) by mouth daily. 01/11/18   Charlesetta Shanks, MD  benzonatate (TESSALON PERLES) 100 MG capsule Take 1 capsule (100 mg total) by mouth 3 (three) times daily as needed for cough. 11/14/17 11/14/18  [name], MD  bismuth subsalicylate (PEPTO BISMOL) 262 MG/15ML suspension Take 30 mLs by mouth every 6 (six) hours as needed for indigestion.    [provider]  Calcium Carbonate-Vitamin D (CALTRATE 600+D) 600-400 MG-UNIT tablet Take 1 tablet by mouth daily. 11/02/17   [name], MD  cetirizine (ZYRTEC) 10 MG tablet TAKE 1 TABLET BY MOUTH EVERY MORNING 11/01/17   [name], MD  clotrimazole (LOTRIMIN) 1 % cream Apply to affected area 2 times daily 01/11/18  Charlesetta Shanks, MD  dextromethorphan-guaiFENesin [facility] DM) 30-600 MG 12hr tablet Take 1 tablet by mouth 2 (two) times daily. 11/21/17   Carmin Muskrat, MD  diclofenac sodium (VOLTAREN) 1 % GEL Apply 4 g topically 4 (four) times daily. Patient not taking: Reported on 01/11/2018 10/21/17   [name], MD   dicyclomine (BENTYL) 20 MG tablet Take 1 tablet (20 mg total) by mouth 2 (two) times daily. 01/14/18   Fatima Blank, MD  diltiazem (CARDIZEM) 60 MG tablet TAKE 1 TABLET BY MOUTH EVERY 6 HOURS AS NEEDED FOR RAPID AFIB HEART RATE OVER 100 Patient not taking: Reported on 01/11/2018 06/10/17   Sherran Needs, NP  diltiazem (CARTIA XT) 120 MG 24 hr capsule TAKE 1 CAPSULE(120 MG) BY MOUTH EVERY EVENING 01/02/18   Sherran Needs, NP  diphenhydrAMINE (BENADRYL) 25 MG tablet Take 1 tablet (25 mg total) by mouth every 6 (six) hours. Take regularly for 3 days then as needed if rash comes back. 01/01/18   Charlann Lange, PA-C  famotidine (PEPCID) 20 MG tablet Take 1 tablet (20 mg total) by mouth 2 (two) times daily. Take regularly for 3 days then as needed if rash comes back. Patient not taking: Reported on 01/11/2018 01/01/18   Charlann Lange, PA-C  famotidine (PEPCID) 20 MG tablet Take 1 tablet (20 mg total) by mouth 2 (two) times daily. 01/11/18   Charlesetta Shanks, MD  flecainide (TAMBOCOR) 100 MG tablet Take 1 tablet (100 mg total) by mouth 2 (two) times daily. 10/27/17   Sherran Needs, NP  fluticasone (FLONASE) 50 MCG/ACT nasal spray Place 1 spray into both nostrils daily as needed for allergies or rhinitis. Patient not taking: Reported on 01/11/2018 11/02/17   [name], MD  fluticasone (FLOVENT HFA) 44 MCG/ACT inhaler Inhale 1 puff into the lungs 2 (two) times daily. 08/08/17   Molt, Bethany, DO  HYDROcodone-acetaminophen (NORCO/VICODIN) 5-325 MG tablet Take 1-2 tablets by mouth every 4 (four) hours as needed for moderate pain or severe pain. 01/11/18   Charlesetta Shanks, MD  levalbuterol [facility] HFA) 45 MCG/ACT inhaler Inhale 1 puff into the lungs every 6 (six) hours as needed for shortness of breath. 11/21/17   Carmin Muskrat, MD  losartan (COZAAR) 50 MG tablet Take 1 tablet (50 mg total) by mouth daily. 10/21/17   [name], MD  magnesium hydroxide (MILK OF MAGNESIA) 400 MG/5ML suspension  Take 15-30 mLs by mouth at bedtime as needed for mild constipation. 11/01/17   Harris, Abigail, PA-C  montelukast (SINGULAIR) 10 MG tablet Take 1 tablet (10 mg total) by mouth at bedtime. Patient taking differently: Take 10 mg by mouth at bedtime as needed (allergies).  09/09/17   [name], MD  Multiple Vitamin (MULTI-VITAMIN PO) Take 1 tablet by mouth daily.    [provider]  ondansetron (ZOFRAN ODT) 4 MG disintegrating tablet Take 1 tablet (4 mg total) by mouth every 8 (eight) hours as needed for up to 3 days for nausea or vomiting. 01/14/18 01/17/18  Cardama, Grayce Sessions, MD  OVER THE COUNTER MEDICATION Take 4 oz by mouth daily as needed (PRUNE JUICE AS NEEDED FOR CONSTIPATION RELIEF).    [provider]  polyethylene glycol powder (GLYCOLAX/MIRALAX) powder Mix 17g of Miralax in 8 ounces of water every other day Patient taking differently: Take by mouth See admin instructions. Mix 17 g. in 8 oz water and drink daily as needed for constipation 07/20/17   [name], MD  potassium chloride SA (K-DUR,KLOR-CON) 10 MEQ tablet  Take 2 tablets (20 mEq total) by mouth daily. 11/12/16   Sherran Needs, NP  predniSONE (DELTASONE) 20 MG tablet Take 2 tablets (40 mg total) by mouth daily with breakfast. For the next four days Patient not taking: Reported on 01/11/2018 11/21/17   Carmin Muskrat, MD  ranitidine (ZANTAC) 150 MG tablet TAKE 1 TABLET BY MOUTH TWICE DAILY Patient taking differently: TAKE 1 TABLET (150 MG) BY MOUTH TWICE DAILY AS NEEDED FOR INDIGESTION 03/16/17   Axel Filler, MD  rivaroxaban (XARELTO) 20 MG TABS tablet Take 1 tablet (20 mg total) by mouth daily with supper. 10/25/17   Sherran Needs, NP  simethicone (GAS-X) 80 MG chewable tablet Chew 1 tablet (80 mg total) by mouth every 6 (six) hours as needed for flatulence. 11/01/17   Margarita Mail, PA-C  Sodium Chloride-Sodium Bicarb (NETI POT SINUS WASH) 2300-700 MG KIT Place 1 Units into the nose 2 (two)  times daily. 11/02/17   [name], MD  sucralfate (CARAFATE) 1 GM/10ML suspension Take 10 mLs (1 g total) by mouth 4 (four) times daily -  with meals and at bedtime. 01/11/18   Charlesetta Shanks, MD  tiotropium (SPIRIVA) 18 MCG inhalation capsule Place 1 capsule (18 mcg total) into inhaler and inhale daily. 10/21/17   [name], MD  Vitamin D, Ergocalciferol, 2000 units CAPS Take 2,000 Units by mouth daily. 11/02/17   [name], MD                                                                                                                                    Past Surgical History Past Surgical History:  Procedure Laterality Date  . ATRIAL FIBRILLATION ABLATION N/A 09/30/2017   Procedure: ATRIAL FIBRILLATION ABLATION;  Surgeon: Thompson Grayer, MD;  Location: [facility];  Service: Cardiovascular;  Laterality: N/A;  . TONSILLECTOMY    . TUBAL LIGATION     Family History Family History  Problem Relation Age of Onset  . Heart attack Mother        [name] age  . Breast cancer Sister        Late 26s; now s/p mastectomy   . Lung cancer Brother        x 2  . Cancer Sister   . Cancer Sister   . Cancer Sister   . Colon cancer Neg Hx   . Colon polyps Neg Hx   . Esophageal cancer Neg Hx   . Rectal cancer Neg Hx   . Stomach cancer Neg Hx     Social History Social History   Tobacco Use  . Smoking status: Former Smoker    Last attempt to quit: 10/24/2015    Years since quitting: 2.2  . Smokeless tobacco: Never Used  Substance Use Topics  . Alcohol use: No  . Drug use: No    Types: Marijuana    Comment: Smoked marijuana in the past.  Allergies Albuterol; Celecoxib; and Protonix [pantoprazole sodium]  Review of Systems Review of Systems  Constitutional: Negative for fever.  Respiratory: Positive for cough. Negative for shortness of breath.   Gastrointestinal: Positive for abdominal pain, nausea and vomiting (NBNB today). Negative for constipation, diarrhea, hematochezia  and melena.  Genitourinary: Negative for dysuria.   All other systems are reviewed and are negative for acute change except as noted in the HPI  Physical Exam Vital Signs  I have reviewed the triage vital signs BP (!) 152/93 (BP Location: Right Arm)   Pulse 93   Temp 98.4 F (36.9 C) (Oral)   Resp 16   SpO2 100%   Physical Exam  Constitutional: She is oriented to person, place, and time. She appears well-developed and well-nourished. No distress.  HENT:  Head: Normocephalic and atraumatic.  Nose: Nose normal.  Eyes: Pupils are equal, round, and reactive to light. Conjunctivae and EOM are normal. Right eye exhibits no discharge. Left eye exhibits no discharge. No scleral icterus.  Neck: Normal range of motion. Neck supple.  Cardiovascular: Normal rate and regular rhythm. Exam reveals no gallop and no friction rub.  No murmur heard. Pulmonary/Chest: Effort normal and breath sounds normal. No stridor. No respiratory distress. She has no rales.  Abdominal: Soft. She exhibits no distension. There is no tenderness. There is no rigidity, no rebound and no guarding.  Musculoskeletal: She exhibits no edema or tenderness.  Neurological: She is alert and oriented to person, place, and time.  Skin: Skin is warm and dry. No rash noted. She is not diaphoretic. No erythema.  Psychiatric: She has a normal mood and affect.  Vitals reviewed.   ED Results and Treatments Labs (all labs ordered are listed, but only abnormal results are displayed) Labs Reviewed  URINALYSIS, ROUTINE W REFLEX MICROSCOPIC - Abnormal; Notable for the following components:      Result Value   APPearance HAZY (*)    Ketones, ur 5 (*)    Protein, ur 30 (*)    Leukocytes, UA MODERATE (*)    Bacteria, UA RARE (*)    All other components within normal limits  LIPASE, BLOOD  COMPREHENSIVE METABOLIC PANEL  CBC                                                                                                                          EKG  EKG Interpretation  Date/Time:    Ventricular Rate:    PR Interval:    QRS Duration:   QT Interval:    QTC Calculation:   R Axis:     Text Interpretation:        Radiology No results found. Pertinent labs & imaging results that were available during my care of the patient were reviewed by me and considered in my medical decision making (see chart for details).  Medications Ordered in ED Medications  ondansetron (ZOFRAN) injection 4 mg (4 mg Intravenous Given 01/14/18 1441)  sodium chloride 0.9 % bolus  1,000 mL (1,000 mLs Intravenous New Bag/Given 01/14/18 1441)  gi cocktail (Maalox,Lidocaine,Donnatal) (30 mLs Oral Given 01/14/18 1443)  dicyclomine (BENTYL) capsule 10 mg (10 mg Oral Given 01/14/18 1442)                                                                                                                                    Procedures Procedures  (including critical care time)  Medical Decision Making / ED Course I have reviewed the nursing notes for this encounter and the patient's prior records (if available in EHR or on provided paperwork).    Patient with intermittent abdominal cramping with associated nausea and vomiting that began this morning.  Abdomen is benign.  Screening labs grossly reassuring without leukocytosis, biliary obstruction or evidence of pancreatitis.  UA consistent with urinary tract infection which she is currently being treated patient treated symptomatically with IV fluids, antiemetics, GI cocktail and Bentyl.  Patient was able to tolerate oral hydration. for.  Felt to be safe for discharge with strict return precautions.  Recommend she continue taking her antibiotics as prescribed.  The patient appears reasonably screened and/or stabilized for discharge and I doubt any other medical condition or other [facility] requiring further screening, evaluation, or treatment in the ED at this time prior to discharge.  The patient is safe for  discharge with strict return precautions.   Final Clinical Impression(s) / ED Diagnoses Final diagnoses:  Abdominal cramping  Non-intractable cyclical vomiting with nausea   Disposition: Discharge  Condition: Good  I have discussed the results, Dx and Tx plan with the patient who expressed understanding and agree(s) with the plan. Discharge instructions discussed at great length. The patient was given strict return precautions who verbalized understanding of the instructions. No further questions at time of discharge.    ED Discharge Orders        Ordered    ondansetron (ZOFRAN ODT) 4 MG disintegrating tablet  Every 8 hours PRN     01/14/18 1619    dicyclomine (BENTYL) 20 MG tablet  2 times daily     01/14/18 1619       Follow Up: Primary care provider  Schedule an appointment as soon as possible for a visit  in 3-5 days, If symptoms do not improve or  worsen      This chart was dictated using voice recognition software.  Despite best efforts to proofread,  errors can occur which can change the documentation meaning.   Fatima Blank, MD 01/14/18 [phone]

## 2018-01-14 NOTE — ED Triage Notes (Signed)
Patient complains of emesis, nausea, and abdominal pain for the last several days. Patient also complains of chest pain when coughing.

## 2018-01-14 NOTE — Discharge Instructions (Signed)

## 2018-01-15 ENCOUNTER — Encounter: Payer: Self-pay | Admitting: *Deleted

## 2018-01-16 IMAGING — DX DG CHEST 2V
2 series · 2 of 2 positions shown · non-contrast
Comparison: Chest x-ray of September 25, 2016

CLINICAL DATA: Atrial fibrillation today. History of atrial
fibrillation episodes in the past, asthma, hypertension, former
smoker.

EXAM:
CHEST  2 VIEW

[chest pa]
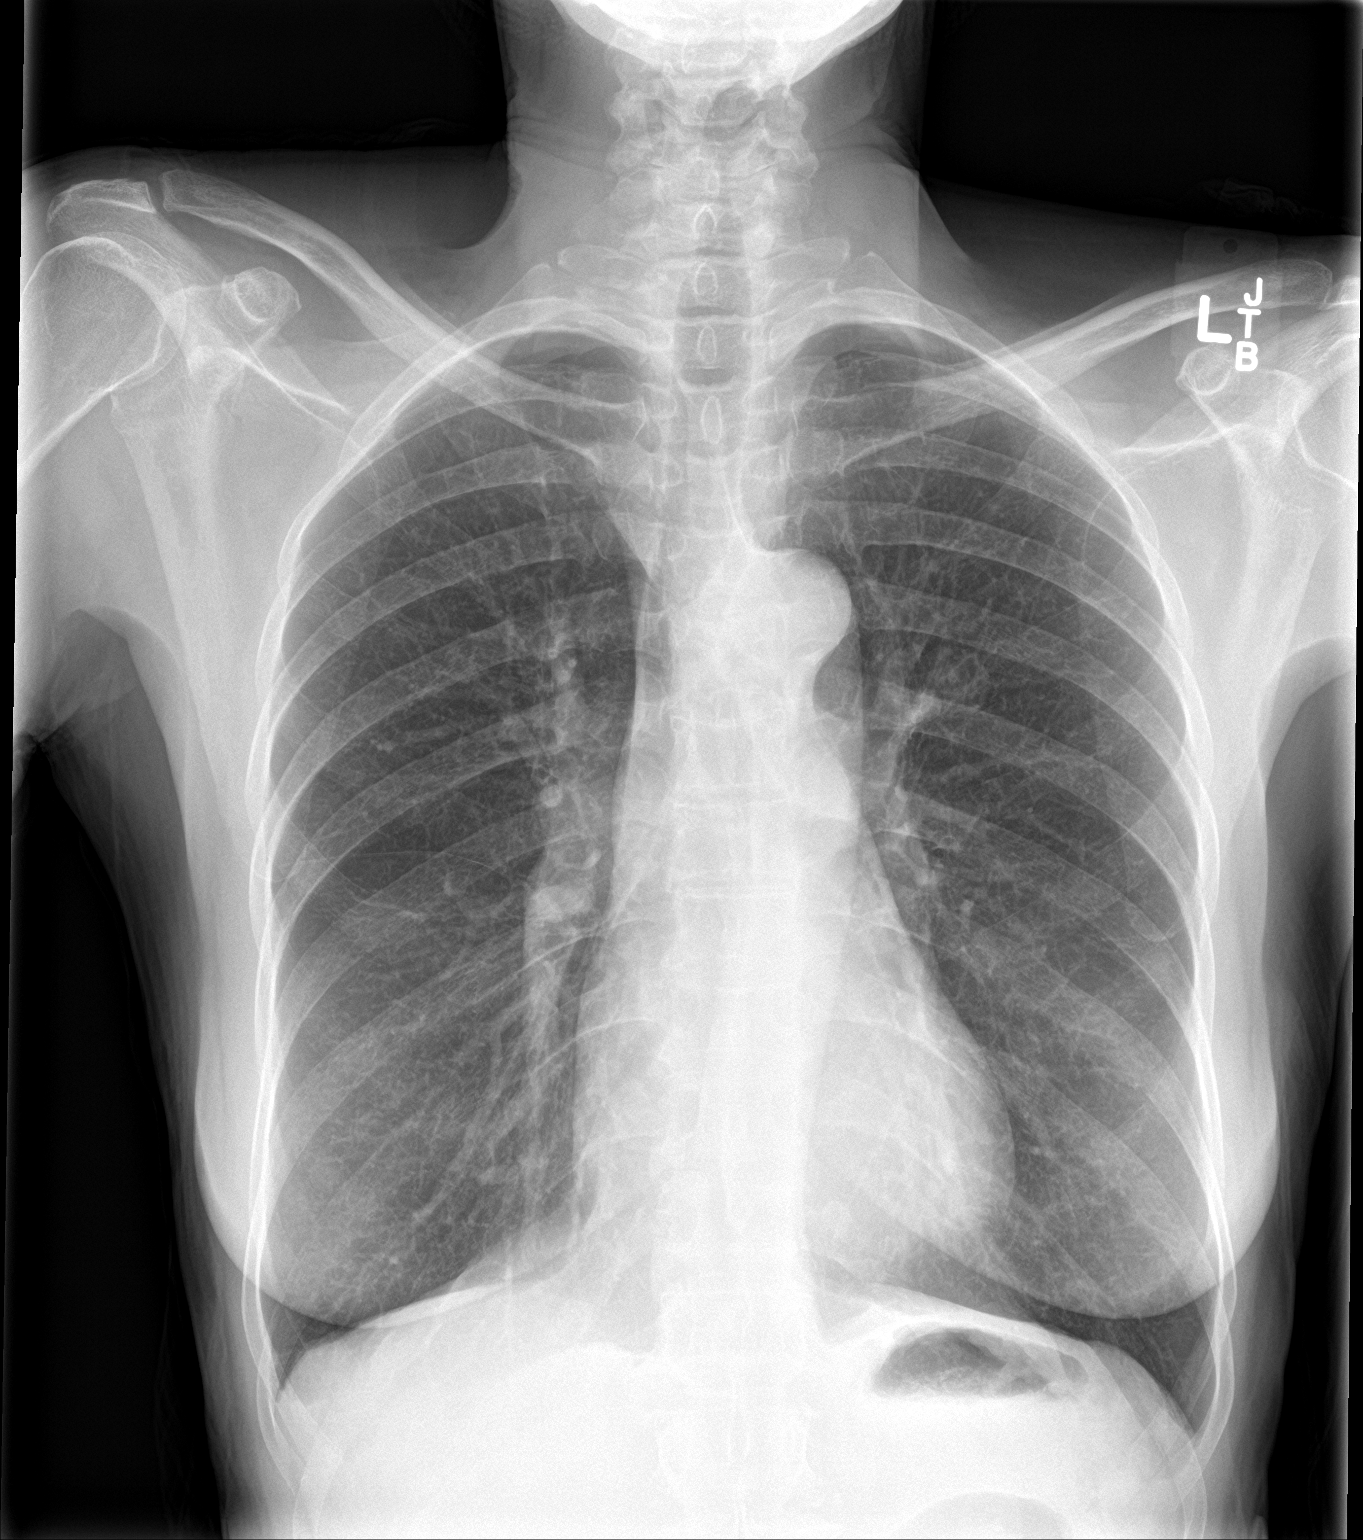

[chest lat]
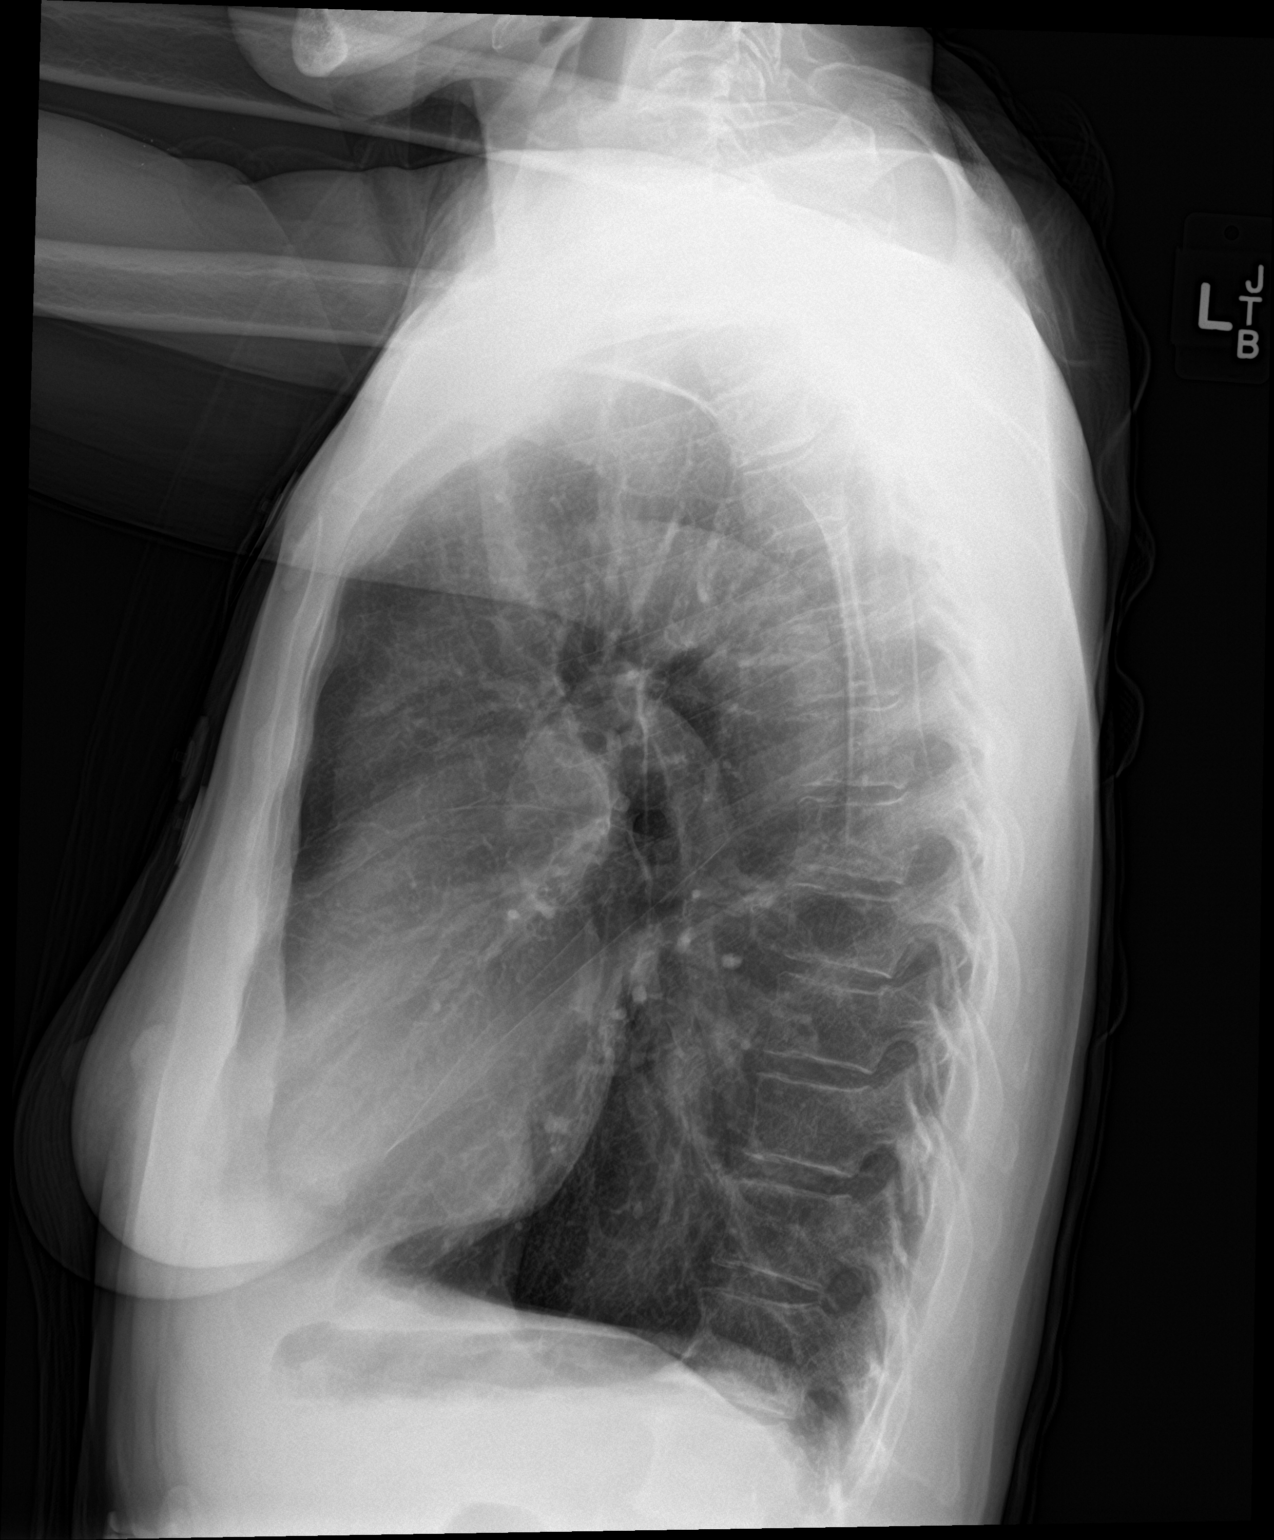

[2 of 2 positions shown; findings below may reference images not displayed]

FINDINGS: The lungs are hyperinflated with hemidiaphragm flattening. There is
no focal infiltrate. There is no pleural effusion. The heart and
pulmonary vascularity are normal. The mediastinum is normal in
width. The trachea is midline. There is calcification in the wall of
the aortic arch. The bony thorax is unremarkable.
IMPRESSION: COPD/asthma. No pneumonia, CHF, nor other acute cardiopulmonary
abnormality.

Thoracic aortic atherosclerosis.

## 2018-01-18 ENCOUNTER — Ambulatory Visit: Payer: Self-pay | Admitting: *Deleted

## 2018-01-19 ENCOUNTER — Encounter: Payer: Self-pay | Admitting: *Deleted

## 2018-01-19 ENCOUNTER — Other Ambulatory Visit: Payer: Self-pay | Admitting: *Deleted

## 2018-01-19 NOTE — Patient Outreach (Signed)
[facility]) Care Management  01/19/2018  [name] 1951-06-29 [id number]   RN Health Coach Monthly Outreach   Outreach Attempt:  Successful telephone outreach to patient for monthly follow up.  HIPAA verified with patient.  Patient reports multiple emergency room visits recently for Asthma flare and abdominal pain.  Reports since starting on Xopenex PRN inhaler her asthma has been better controlled.  Patient stating the Xopenex inhaler is going to cost her $75 to refill and is requesting medication assistance.  [place] services reviewed with patient and patient verbally agrees to [facility] referral.  Also suggested to patient to discuss with physician if they had any samples until [facility] Pharmacist could assist patient or have physician contact her pharmacy to see if medication needed a prior authorization with her Medicaid to be filled for reduced price.  Patient stated she would have these discussions with physician.  States she has completed her antibiotics she was treated for urinary tract infection, but continues to have abdominal pain.  Feels pain may be related to indigestion.Plans to discuss pain with her physician at the appointment tomorrow.    Appointments:  Patient has scheduled appointment with her primary care provider tomorrow, 01/20/2018.  Plan: RN Health Coach will make next monthly outreach to patient in the month of June. RN Health Coach will send [place] referral for medication assistance for Xopenex rescue inhaler.  [place] [phone] [email or web address]

## 2018-01-19 NOTE — Addendum Note (Signed)
Addended by: Hubert Azure D on: 01/19/2018 03:28 PM   Modules accepted: Orders

## 2018-01-20 ENCOUNTER — Telehealth: Payer: Self-pay | Admitting: Pharmacist

## 2018-01-20 ENCOUNTER — Other Ambulatory Visit: Payer: Self-pay | Admitting: Internal Medicine

## 2018-01-20 ENCOUNTER — Ambulatory Visit: Payer: Self-pay | Admitting: Pharmacist

## 2018-01-20 ENCOUNTER — Other Ambulatory Visit: Payer: Self-pay

## 2018-01-20 ENCOUNTER — Encounter: Payer: Self-pay | Admitting: Internal Medicine

## 2018-01-20 ENCOUNTER — Ambulatory Visit (INDEPENDENT_AMBULATORY_CARE_PROVIDER_SITE_OTHER): Payer: Medicare Other | Admitting: Internal Medicine

## 2018-01-20 VITALS — BP 151/89 | HR 92 | Temp 98.1°F | Ht 67.0 in | Wt 161.9 lb

## 2018-01-20 DIAGNOSIS — Z7901 Long term (current) use of anticoagulants: Secondary | ICD-10-CM | POA: Diagnosis not present

## 2018-01-20 DIAGNOSIS — K219 Gastro-esophageal reflux disease without esophagitis: Secondary | ICD-10-CM | POA: Diagnosis not present

## 2018-01-20 DIAGNOSIS — J4520 Mild intermittent asthma, uncomplicated: Secondary | ICD-10-CM

## 2018-01-20 DIAGNOSIS — R1033 Periumbilical pain: Secondary | ICD-10-CM | POA: Diagnosis not present

## 2018-01-20 DIAGNOSIS — N390 Urinary tract infection, site not specified: Secondary | ICD-10-CM | POA: Diagnosis not present

## 2018-01-20 DIAGNOSIS — Z79899 Other long term (current) drug therapy: Secondary | ICD-10-CM

## 2018-01-20 DIAGNOSIS — I480 Paroxysmal atrial fibrillation: Secondary | ICD-10-CM | POA: Diagnosis not present

## 2018-01-20 DIAGNOSIS — I10 Essential (primary) hypertension: Secondary | ICD-10-CM

## 2018-01-20 DIAGNOSIS — B965 Pseudomonas (aeruginosa) (mallei) (pseudomallei) as the cause of diseases classified elsewhere: Secondary | ICD-10-CM | POA: Diagnosis not present

## 2018-01-20 DIAGNOSIS — Z7951 Long term (current) use of inhaled steroids: Secondary | ICD-10-CM

## 2018-01-20 MED ORDER — TRAMADOL HCL 50 MG PO TABS: 50.0000 mg | ORAL_TABLET | Freq: Four times a day (QID) | ORAL | 0 refills | Status: DC | PRN

## 2018-01-20 MED ORDER — ESOMEPRAZOLE MAGNESIUM 20 MG PO CPDR: 20.0000 mg | DELAYED_RELEASE_CAPSULE | Freq: Every day | ORAL | 1 refills | Status: DC

## 2018-01-20 MED ORDER — METRONIDAZOLE 500 MG PO TABS: 500.0000 mg | ORAL_TABLET | Freq: Two times a day (BID) | ORAL | 0 refills | Status: AC

## 2018-01-20 MED ORDER — FLUTICASONE PROPIONATE HFA 220 MCG/ACT IN AERO: 1.0000 | INHALATION_SPRAY | Freq: Two times a day (BID) | RESPIRATORY_TRACT | 12 refills | Status: DC

## 2018-01-20 MED ORDER — NAPROXEN 500 MG PO TABS: 500.0000 mg | ORAL_TABLET | Freq: Two times a day (BID) | ORAL | 0 refills | Status: DC

## 2018-01-20 NOTE — Progress Notes (Signed)
   CC: Abdominal pain for 4 days.  HPI:  [name] is a 67 y.o. with past medical history as listed below came to the clinic with complaint of abdominal pain for 4 days.  Patient is complaining of pain around her umbilical area, pain is constant, non radiating, no aggravating or alleviating factor.  She denies any nausea, vomiting, diarrhea or constipation.  No change in her weight or appetite.  No urinary symptoms.  Denies any vaginal discharge. Patient was seen in ED on May 25 for similar complaint and a renal CT stone study was conducted which shows few very small nonobstructing stones in right kidney and new, area of inflammation in the right lower lobe of lung and her urine shows leukocytosis.  She was given a prescription of Augmentin and azithromycin for this presumed pneumonia and UTI.  She denies any coughing fever or chills.  Did completed her course of antibiotics.  Please see assessment and plan for her chronic conditions.  Past Medical History:  Diagnosis Date  . A-fib ([place])   . Allergy    seasonal  . Asthma   . Chronic anticoagulation   . GERD (gastroesophageal reflux disease)   . Hypertension   . PAF (paroxysmal atrial fibrillation) ([place]) 11/26/2015   April/May 2017: Multiple ED visits for A. Fib.  02/12/16: Improved on diltiazem IV. Cardiology consulted in the ED and discharge patient with diltiazem XR 120 mg once daily with 60 mg by mouth every 6 hours as needed for symptomatic palpitations, flecainide 50 mg twice daily. Referred to the atrial fibrillation clinic.  02/17/16: Seen in the atrial fibrillation clinic by NP Roderic Palau. Normal sin   Review of Systems: Negative except mentioned in HPI.  Physical Exam:  Vitals:   01/20/18 1504  BP: (!) 151/89  Pulse: 92  Temp: 98.1 F (36.7 C)  TempSrc: Oral  SpO2: 97%  Weight: 161 lb 14.4 oz (73.4 kg)  Height: 5\' 7"  (1.702 m)    General: Vital signs reviewed.  Patient is well-developed and well-nourished, in  no acute distress and cooperative with exam.  Head: Normocephalic and atraumatic. Eyes: EOMI, conjunctivae normal, no scleral icterus.  Cardiovascular: RRR, S1 normal, S2 normal, no murmurs, gallops, or rubs. Pulmonary/Chest: Clear to auscultation bilaterally, no wheezes, rales, or rhonchi. Abdominal: Soft, mild tenderness along umbilical area, non-distended, BS +, no masses, organomegaly, or guarding present.  Extremities: No lower extremity edema bilaterally,  pulses symmetric and intact bilaterally. No cyanosis or clubbing. Skin: Warm, dry and intact. No rashes or erythema. Psychiatric: Normal mood and affect. speech and behavior is normal. Cognition and memory are normal.  Assessment & Plan:   See Encounters Tab for problem based charting.  Patient discussed with Dr. Dareen Piano.

## 2018-01-20 NOTE — Assessment & Plan Note (Signed)
She was in sinus rhythm during this office visit.   She denies any more episodes of A. fib since her ablation.  Continue current management with diltiazem and Xarelto.

## 2018-01-20 NOTE — Patient Instructions (Signed)
Thank you for visiting clinic today. Please do not take naproxen, instead I am giving you a prescription of tramadol you can take it 3 times a day if needed for pain and see if that will help with your pain. I am also giving you a prescription of Nexium for your acid reflux. I am giving you a prescription of metronidazole which is an antibiotic because of a positive test done in the ED during your previous visit, you will take 1 tablet twice a day for 7 days. Your blood pressure was high, please take your medicines before coming to the clinic. Please follow-up in 79-month.

## 2018-01-20 NOTE — Assessment & Plan Note (Signed)
No acute exacerbation.  She was asking for help to get Xopenex inhaler as it does not cause any palpitations for her  but she has to pay dollar 75 as a copayment and cannot afford it.  Provided her with a good Rx coupon for Xopenex inhaler, she will pay dollar 25.  She was enrolled and Medicare prescription assist program, told the patient that it will take few weeks and hopefully they will able to pay her Xopenex.

## 2018-01-20 NOTE — Progress Notes (Signed)
Internal Medicine Clinic Attending  Case discussed with Dr. Amin at the time of the visit.  We reviewed the resident's history and exam and pertinent patient test results.  I agree with the assessment, diagnosis, and plan of care documented in the resident's note.    

## 2018-01-20 NOTE — Assessment & Plan Note (Signed)
BP Readings from Last 3 Encounters:  01/20/18 (!) 151/89  01/14/18 (!) 148/92  01/11/18 124/76   Her blood pressure was elevated today, she never took her medications this morning.  She was advised to take her medication regularly especially before coming to the clinic so we can assess her blood pressure needs.  -Continue current management and reassess during next follow-up visit.

## 2018-01-20 NOTE — Patient Outreach (Signed)
[facility]) Care Management  01/20/2018  [name] 10/05/1950 [id number]   67 year old female followed by [facility] for COPD management.  [place] services requested for medication assistance with Xopenex.  PMHx includes, but not limited to, atrial fibrillation on chronic anticoagulation, tobacco abuse, COPD, asthma, GERD, and HTN.    Per review of [facility], patient has allergy listed to albuterol related to concern that it may induce atrial fibrilliation.  Patient recently changed to Levalbuterol inhaler (Xopenex).  Xopenex is listed as Tier 4 through [employer].   Successful call placed to [name]. HIPAA identifiers verified. [name] reports she cannot take albuterol at all due to atrial fibrillation.  She reports that she was recently changed to levalbuterol and it has worked very well for her.  She states the co-pay is $75 which she cannot afford.  She believes her other inhalers were $3.  She is out of Xopenex currently.   She does not wish to review medications at this time.   Medication assistance: Patient may be able to have Tier Exception approved through insurance from Tier 4 --> Tier 3 if supporting documentation submitted by provider.  Patient has office visit with Dr. Reesa Bartlett today.  Plan: I will fax Tier Exception request to Dr. Latina Bartlett office and will send in-basket communication to provider and staff.  I will follow-up with patient next week regarding status.   [name], PharmD, [name] [phone]

## 2018-01-20 NOTE — Assessment & Plan Note (Signed)
She was having very nonspecific pain with normal CT and exam. No other associated symptoms.  Most likely a abdominal wall pain.  Give her a prescription of tramadol to try.

## 2018-01-20 NOTE — Assessment & Plan Note (Signed)
She continued to get bad GERD symptoms, Pepcid and sucralfate is not helping. Did complained of developing some palpitations after taking Protonix.  She is willing to try a different medicine from the same class. Prescription for Nexium was provided-with instructions that she should not take it if she experience any palpitations.

## 2018-01-25 ENCOUNTER — Emergency Department (HOSPITAL_COMMUNITY)
Admission: EM | Admit: 2018-01-25 | Discharge: 2018-01-25 | Disposition: A | Discharge status: Home / Self Care | Payer: Medicare Other | Attending: Emergency Medicine | Admitting: Emergency Medicine

## 2018-01-25 ENCOUNTER — Emergency Department (HOSPITAL_COMMUNITY): Payer: Medicare Other

## 2018-01-25 ENCOUNTER — Encounter (HOSPITAL_COMMUNITY): Payer: Self-pay | Admitting: Emergency Medicine

## 2018-01-25 ENCOUNTER — Ambulatory Visit: Payer: Self-pay | Admitting: Pharmacist

## 2018-01-25 ENCOUNTER — Other Ambulatory Visit: Payer: Self-pay

## 2018-01-25 DIAGNOSIS — R1031 Right lower quadrant pain: Secondary | ICD-10-CM | POA: Insufficient documentation

## 2018-01-25 DIAGNOSIS — Z79899 Other long term (current) drug therapy: Secondary | ICD-10-CM | POA: Diagnosis not present

## 2018-01-25 DIAGNOSIS — Z7901 Long term (current) use of anticoagulants: Secondary | ICD-10-CM | POA: Insufficient documentation

## 2018-01-25 DIAGNOSIS — Z87891 Personal history of nicotine dependence: Secondary | ICD-10-CM | POA: Insufficient documentation

## 2018-01-25 DIAGNOSIS — R109 Unspecified abdominal pain: Secondary | ICD-10-CM | POA: Diagnosis not present

## 2018-01-25 DIAGNOSIS — R079 Chest pain, unspecified: Secondary | ICD-10-CM | POA: Diagnosis not present

## 2018-01-25 DIAGNOSIS — I10 Essential (primary) hypertension: Secondary | ICD-10-CM | POA: Diagnosis not present

## 2018-01-25 DIAGNOSIS — J45909 Unspecified asthma, uncomplicated: Secondary | ICD-10-CM | POA: Diagnosis not present

## 2018-01-25 LAB — URINALYSIS, ROUTINE W REFLEX MICROSCOPIC
Bacteria, UA: NONE SEEN
Bilirubin Urine: NEGATIVE
Glucose, UA: NEGATIVE mg/dL
KETONES UR: NEGATIVE mg/dL
Leukocytes, UA: NEGATIVE
Nitrite: NEGATIVE
Protein, ur: 30 mg/dL — AB
SPECIFIC GRAVITY, URINE: 1.014 (ref 1.005–1.030)
pH: 6 (ref 5.0–8.0)

## 2018-01-25 LAB — BASIC METABOLIC PANEL
Anion gap: 11 (ref 5–15)
BUN: 8 mg/dL (ref 6–20)
CALCIUM: 9.4 mg/dL (ref 8.9–10.3)
CO2: 23 mmol/L (ref 22–32)
CREATININE: 0.81 mg/dL (ref 0.44–1.00)
Chloride: 108 mmol/L (ref 101–111)
GFR calc Af Amer: >60 mL/min (ref >60)
Glucose, Bld: 103 mg/dL — ABNORMAL HIGH (ref 65–99)
Potassium: 3.3 mmol/L — ABNORMAL LOW (ref 3.5–5.1)
SODIUM: 142 mmol/L (ref 135–145)

## 2018-01-25 LAB — CBC
HCT: 40.7 % (ref 36.0–46.0)
Hemoglobin: 13 g/dL (ref 12.0–15.0)
MCH: 26.1 pg (ref 26.0–34.0)
MCHC: 31.9 g/dL (ref 30.0–36.0)
MCV: 81.6 fL (ref 78.0–100.0)
PLATELETS: 281 10*3/uL (ref 150–400)
RBC: 4.99 MIL/uL (ref 3.87–5.11)
RDW: 15.8 % — AB (ref 11.5–15.5)
WBC: 3.4 10*3/uL — ABNORMAL LOW (ref 4.0–10.5)

## 2018-01-25 LAB — I-STAT TROPONIN, ED
TROPONIN I, POC: 0 ng/mL (ref 0.00–0.08)
Troponin i, poc: 0 ng/mL (ref 0.00–0.08)

## 2018-01-25 MED ORDER — FENTANYL CITRATE (PF) 100 MCG/2ML IJ SOLN
50.0000 ug | Freq: Once | INTRAMUSCULAR | Status: AC
  Administered 2018-01-25: 50 ug via INTRAVENOUS
  Filled 2018-01-25: qty 2

## 2018-01-25 MED ORDER — DICYCLOMINE HCL 20 MG PO TABS: 20.0000 mg | ORAL_TABLET | Freq: Two times a day (BID) | ORAL | 0 refills | Status: DC

## 2018-01-25 MED ORDER — IOHEXOL 300 MG/ML  SOLN
100.0000 mL | Freq: Once | INTRAMUSCULAR | Status: AC | PRN
  Administered 2018-01-25: 100 mL via INTRAVENOUS

## 2018-01-25 NOTE — Discharge Instructions (Signed)
Please read and follow all provided instructions.  Your diagnoses today include:  1. Abdominal pain, unspecified abdominal location   2. Chest pain, unspecified type     Tests performed today include:  Blood counts and electrolytes  Blood tests to check kidney function  Urine test for infection -no infection today  Blood test and EKG for the heart -no sign of heart attack  CT scan of the abdomen and pelvis -no emergency conditions noted  Vital signs. See below for your results today.   Medications prescribed:   Bentyl - medication for intestinal cramps and spasms  Take any prescribed medications only as directed.  Home care instructions:   Follow any educational materials contained in this packet.  Follow-up instructions: Please follow-up with your primary care provider in the next 3 days for further evaluation of your symptoms.    Return instructions:  SEEK IMMEDIATE MEDICAL ATTENTION IF:  The pain does not go away or becomes severe   A temperature above 101F develops   Repeated vomiting occurs (multiple episodes)   The pain becomes localized to portions of the abdomen. The right side could possibly be appendicitis. In an adult, the left lower portion of the abdomen could be colitis or diverticulitis.   Blood is being passed in stools or vomit (bright red or black tarry stools)   You develop chest pain, difficulty breathing, dizziness or fainting, or become confused, poorly responsive, or inconsolable (young children)  If you have any other emergent concerns regarding your health  Additional Information: Abdominal (belly) pain can be caused by many things. Your caregiver performed an examination and possibly ordered blood/urine tests and imaging (CT scan, x-rays, ultrasound). Many cases can be observed and treated at home after initial evaluation in the emergency department. Even though you are being discharged home, abdominal pain can be unpredictable. Therefore,  you need a repeated exam if your pain does not resolve, returns, or worsens. Most patients with abdominal pain don't have to be admitted to the hospital or have surgery, but serious problems like appendicitis and gallbladder attacks can start out as nonspecific pain. Many abdominal conditions cannot be diagnosed in one visit, so follow-up evaluations are very important.  Your vital signs today were: BP (!) 178/98    Pulse 80    Temp 98.2 F (36.8 C) (Oral)    Resp (!) 22    SpO2 98%  If your blood pressure (bp) was elevated above 135/85 this visit, please have this repeated by your doctor within one month. --------------

## 2018-01-25 NOTE — ED Triage Notes (Signed)
Patient complains of sharp chest pain and dizziness that started two days ago. Reports history of cardiac ablation for atrial fibrillation in February. Patient alert, oriented, and in no apparent distress at this time.

## 2018-01-25 NOTE — ED Provider Notes (Signed)
[facility] EMERGENCY DEPARTMENT Provider Note   CSN: [id number] Arrival date & time: 01/25/18  2919     History   Chief Complaint Chief Complaint  Patient presents with  . Chest Pain    HPI [name] is a 67 y.o. female with PMH A-fib s/p ablation in February on anticoagulation with Xarelto, GERD, hypertension, and asthma here for chest pain and RLQ pain.  Patient notes that she has had right breast pain that radiates to her neck since yesterday.  She denies any trauma or inciting event, denies any heavy lifting.  She notes that the pain is intermittent and sharp in nature.  She cannot identify anything that makes the pain better or worse.  Denies any shortness of breath or heartburn.  Admits to subjective fevers.    She is tearful when describing her pain.  She is very frustrated because she also has right lower quadrant pain that started approximately 1 week ago.  She notes that the right lower quadrant pain is constant and nonradiating.  She was diagnosed with abdominal wall pain and given a prescription for tramadol to try.  She notes that the tramadol was too strong for her.  HPI  Past Medical History:  Diagnosis Date  . A-fib ([place])   . Allergy    seasonal  . Asthma   . Chronic anticoagulation   . GERD (gastroesophageal reflux disease)   . Hypertension   . PAF (paroxysmal atrial fibrillation) ([place]) 11/26/2015   April/May 2017: Multiple ED visits for A. Fib.  02/12/16: Improved on diltiazem IV. Cardiology consulted in the ED and discharge patient with diltiazem XR 120 mg once daily with 60 mg by mouth every 6 hours as needed for symptomatic palpitations, flecainide 50 mg twice daily. Referred to the atrial fibrillation clinic.  02/17/16: Seen in the atrial fibrillation clinic by NP Roderic Palau. Normal sin    Patient Active Problem List   Diagnosis Date Noted  . Periumbilical abdominal pain 01/20/2018  . Hyperpigmented skin lesion 09/09/2017  . Allergic  contact dermatitis 04/27/2017  . Osteoporosis without current pathological fracture 04/15/2017  . Normocytic anemia 10/19/2016  . Hyperlipidemia 03/18/2016  . Special screening for malignant neoplasms, colon 03/17/2016  . Eczema 03/17/2016  . Cervical cancer screening 03/17/2016  . Assistance with transportation 03/17/2016  . Hypertension   . GERD (gastroesophageal reflux disease)   . PAF (paroxysmal atrial fibrillation) ([place]) 11/26/2015  . Chronic anticoagulation 11/26/2015  . Folliculitis 16/60/6004  . Marijuana abuse 06/11/2015  . Asthma, mild intermittent     Past Surgical History:  Procedure Laterality Date  . ATRIAL FIBRILLATION ABLATION N/A 09/30/2017   Procedure: ATRIAL FIBRILLATION ABLATION;  Surgeon: Thompson Grayer, MD;  Location: [facility];  Service: Cardiovascular;  Laterality: N/A;  . TONSILLECTOMY    . TUBAL LIGATION       OB History   None      Home Medications    Prior to Admission medications   Medication Sig Start Date End Date Taking? Authorizing Provider  Acetaminophen 500 MG coapsule Take 500 mg by mouth every 6 (six) hours as needed for mild pain.    Yes [provider]  benzonatate (TESSALON PERLES) 100 MG capsule Take 1 capsule (100 mg total) by mouth 3 (three) times daily as needed for cough. 11/14/17 11/14/18 Yes [name], MD  bismuth subsalicylate (PEPTO BISMOL) 262 MG/15ML suspension Take 30 mLs by mouth every 6 (six) hours as needed for indigestion.  Yes [provider]  diltiazem (CARDIZEM) 60 MG tablet TAKE 1 TABLET BY MOUTH EVERY 6 HOURS AS NEEDED FOR RAPID AFIB HEART RATE OVER 100 Patient taking differently: TAKE 1 TABLET BY MOUTH EVERY IN THE MORNING 06/10/17  Yes Sherran Needs, NP  diltiazem (CARTIA XT) 120 MG 24 hr capsule TAKE 1 CAPSULE(120 MG) [place] EVENING Patient taking differently: Take 120 mg by mouth as needed.  01/02/18  Yes Sherran Needs, NP  diphenhydrAMINE (BENADRYL) 25 MG tablet Take 1  tablet (25 mg total) by mouth every 6 (six) hours. Take regularly for 3 days then as needed if rash comes back. 01/01/18  Yes Upstill, Nehemiah Settle, PA-C  esomeprazole (NEXIUM) 20 MG capsule TAKE 1 CAPSULE(20 MG) BY MOUTH DAILY 01/23/18  Yes [name], MD  fluticasone (FLOVENT HFA) 220 MCG/ACT inhaler Inhale 1 puff into the lungs 2 (two) times daily. 01/20/18  Yes [name], MD  levalbuterol [facility] HFA) 45 MCG/ACT inhaler Inhale 1 puff into the lungs every 6 (six) hours as needed for shortness of breath. 11/21/17  Yes Carmin Muskrat, MD  losartan (COZAAR) 50 MG tablet Take 1 tablet (50 mg total) by mouth daily. 10/21/17  Yes [name], MD  magnesium hydroxide (MILK OF MAGNESIA) 400 MG/5ML suspension Take 15-30 mLs by mouth at bedtime as needed for mild constipation. 11/01/17  Yes Harris, Abigail, PA-C  metroNIDAZOLE (FLAGYL) 500 MG tablet Take 1 tablet (500 mg total) by mouth 2 (two) times daily for 7 days. 01/20/18 01/27/18 Yes [name], MD  montelukast (SINGULAIR) 10 MG tablet Take 1 tablet (10 mg total) by mouth at bedtime. 09/09/17  Yes [name], MD  Multiple Vitamin (MULTI-VITAMIN PO) Take 1 tablet by mouth daily.   Yes [provider]  OVER THE COUNTER MEDICATION Take 4 oz by mouth daily as needed (PRUNE JUICE AS NEEDED FOR CONSTIPATION RELIEF).   Yes [provider]  polyethylene glycol powder (GLYCOLAX/MIRALAX) powder Mix 17g of Miralax in 8 ounces of water every other day Patient taking differently: Take by mouth See admin instructions. Mix 17 g. in 8 oz water and drink daily as needed for constipation 07/20/17  Yes [name], MD  potassium chloride SA (K-DUR,KLOR-CON) 10 MEQ tablet Take 2 tablets (20 mEq total) by mouth daily. Patient taking differently: Take 10 mEq by mouth 2 (two) times daily.  11/12/16  Yes Sherran Needs, NP  rivaroxaban (XARELTO) 20 MG TABS tablet Take 1 tablet (20 mg total) by mouth daily with supper. 10/25/17  Yes Sherran Needs, NP    simethicone (GAS-X) 80 MG chewable tablet Chew 1 tablet (80 mg total) by mouth every 6 (six) hours as needed for flatulence. 11/01/17  Yes Harris, Abigail, PA-C  tiotropium (SPIRIVA) 18 MCG inhalation capsule Place 1 capsule (18 mcg total) into inhaler and inhale daily. 10/21/17  Yes [name], MD  Calcium Carbonate-Vitamin D (CALTRATE 600+D) 600-400 MG-UNIT tablet Take 1 tablet by mouth daily. Patient not taking: Reported on 01/25/2018 11/02/17   [name], MD  cetirizine (ZYRTEC) 10 MG tablet TAKE 1 TABLET BY MOUTH EVERY MORNING Patient not taking: Reported on 01/25/2018 11/01/17   [name], MD  flecainide (TAMBOCOR) 100 MG tablet Take 1 tablet (100 mg total) by mouth 2 (two) times daily. Patient not taking: Reported on 01/25/2018 10/27/17   Sherran Needs, NP  fluticasone [facility]) 50 MCG/ACT nasal spray Place 1 spray into both nostrils daily as needed for allergies or rhinitis. Patient not taking: Reported  on 01/25/2018 11/02/17   [name], MD  Sodium Chloride-Sodium Bicarb (NETI POT SINUS [place]) 2300-700 MG KIT Place 1 Units into the nose 2 (two) times daily. Patient not taking: Reported on 01/25/2018 11/02/17   [name], MD  Vitamin D, Ergocalciferol, 2000 units CAPS Take 2,000 Units by mouth daily. Patient not taking: Reported on 01/25/2018 11/02/17   [name], MD    Family History Family History  Problem Relation Age of Onset  . Heart attack Mother        [name] age  . Breast cancer Sister        Late 14s; now s/p mastectomy   . Lung cancer Brother        x 2  . Cancer Sister   . Cancer Sister   . Cancer Sister   . Colon cancer Neg Hx   . Colon polyps Neg Hx   . Esophageal cancer Neg Hx   . Rectal cancer Neg Hx   . Stomach cancer Neg Hx     Social History Social History   Tobacco Use  . Smoking status: Former Smoker    Last attempt to quit: 10/24/2015    Years since quitting: 2.2  . Smokeless tobacco: Never Used  Substance Use Topics  . Alcohol use: No  .  Drug use: No    Types: Marijuana    Comment: Smoked marijuana in the past.     Allergies   Albuterol; Celecoxib; and Protonix [pantoprazole sodium]   Review of Systems Review of Systems   Physical Exam Updated Vital Signs BP (!) 137/96   Pulse 77   Temp 98.2 F (36.8 C) (Oral)   Resp (!) 25   SpO2 99%   Physical Exam  Constitutional: She is oriented to person, place, and time. She appears well-developed and well-nourished.  HENT:  Head: Normocephalic and atraumatic.  Eyes: Pupils are equal, round, and reactive to light.  Neck: Normal range of motion.  Cardiovascular: Normal rate, regular rhythm and normal pulses.  Pulmonary/Chest: Effort normal and breath sounds normal. She exhibits tenderness (tender to palpation of rib under right breast). She exhibits no mass. Right breast exhibits no inverted nipple and no mass.    Abdominal: Soft. Bowel sounds are normal. She exhibits no distension. There is no hepatosplenomegaly. There is tenderness in the right lower quadrant and suprapubic area. There is no CVA tenderness and negative Murphy's sign.  Musculoskeletal: Normal range of motion.  Neurological: She is alert and oriented to person, place, and time. She is not disoriented. No cranial nerve deficit.  Skin: Skin is warm and dry. Capillary refill takes less than 2 seconds.  Psychiatric: She has a normal mood and affect. Her behavior is normal.     ED Treatments / Results  Labs (all labs ordered are listed, but only abnormal results are displayed) Labs Reviewed  BASIC METABOLIC PANEL - Abnormal; Notable for the following components:      Result Value   Potassium 3.3 (*)    Glucose, Bld 103 (*)    All other components within normal limits  CBC - Abnormal; Notable for the following components:   WBC 3.4 (*)    RDW 15.8 (*)    All other components within normal limits  URINALYSIS, ROUTINE W REFLEX MICROSCOPIC - Abnormal; Notable for the following components:   Hgb  urine dipstick SMALL (*)    Protein, ur 30 (*)    All other components within normal limits  I-STAT TROPONIN, ED  I-STAT TROPONIN, ED    EKG EKG Interpretation  Date/Time:  Wednesday January 25 2018 08:39:35 EDT Ventricular Rate:  89 PR Interval:  154 QRS Duration: 78 QT Interval:  376 QTC Calculation: 457 R Axis:   49 Text Interpretation:  Normal sinus rhythm Normal ECG Confirmed by Elnora Morrison [id number] on 01/25/2018 12:35:52 PM   Radiology Dg Chest 2 View  Result Date: 01/25/2018 CLINICAL DATA:  Sharp chest pain. EXAM: CHEST - 2 VIEW COMPARISON:  11/21/2017 FINDINGS: Cardiomediastinal silhouette is normal. Tortuosity of the aorta. Mediastinal contours appear intact. There is no evidence of focal airspace consolidation, pleural effusion or pneumothorax. Osseous structures are without acute abnormality. Soft tissues are grossly normal. IMPRESSION: No active cardiopulmonary disease. Electronically Signed   By: Fidela Salisbury M.D.   On: 01/25/2018 09:35    Procedures Procedures (including critical care time)  Medications Ordered in ED Medications  fentaNYL (SUBLIMAZE) injection 50 mcg (50 mcg Intravenous Given 01/25/18 1341)     Initial Impression / Assessment and Plan / ED Course  I have reviewed the triage vital signs and the nursing notes.  Pertinent labs & imaging results that were available during my care of the patient were reviewed by me and considered in my medical decision making (see chart for details).    Patient presenting for right lower quadrant pain as well as chest pain.  In terms of her chest pain, she is focally tender to palpation under her right breast, no masses palpated.  Chest x-ray unremarkable.  EKG showing normal sinus rhythm and no ST changes.  Troponin 0.  Less likely cardiac in etiology.  Most likely musculoskeletal.  In terms of her right lower quadrant pain, she is tender to palpation in her right lower quadrant as well as suprapubic area, she  pulls away my hand when palpating. No leukocytosis and patient is afebrile pointing away from an infectious GI process. 50 mcg of fentanyl given. Urinalysis and CT abdomen ordered.  Fentanyl helped abdominal pain. UA showing small hgb and protein. No nitrites, leukocytes or bacteria to suggest UTI. Patient still has CT abdomen pending. Will get 1 more troponin. Patient signed out to [facility] and Dr. Reather Converse.   Final Clinical Impressions(s) / ED Diagnoses   Final diagnoses:  Abdominal pain, unspecified abdominal location  Chest pain, unspecified type    ED Discharge Orders    None       Carlyle Dolly, MD 01/25/18 1546    Elnora Morrison, MD 01/25/18 [phone]

## 2018-01-25 NOTE — ED Notes (Signed)
Patient transported to CT 

## 2018-01-25 NOTE — ED Provider Notes (Signed)
3:24 PM Handoff from Dr. Juanito Doom at shift change. Pt with multiple complaints including CP and lower abdominal pain worse in RLQ and suprapubic area. Work-up to this point has been unremarkable. No recurrent UTI, pt was concerned she had another UTI. CT ordered to eval for intra-abdominal cause of abdominal pain.   Plan: CT abd pelvis for RLQ pain, repeat trop chest/breast pain. F/u on results -- if negative, can go home.   BP (!) 137/96   Pulse 77   Temp 98.2 F (36.8 C) (Oral)   Resp (!) 25   SpO2 99%    5:58 PM CT results reviewed by myself.  Patient updated on results.  Abdomen on repeat exam is soft and nontender.  Patient is complaining about intermittent cramping pains in her lower abdomen.  She also is complaining about feeling like his her urine is hot.  I reassured her that her urine test today looked normal she is requesting something for the cramps.  We will discharged home with Bentyl.  She will follow-up with her doctor as to whether the metronidazole prescribed previously is necessary.  BP (!) 178/98   Pulse 80   Temp 98.2 F (36.8 C) (Oral)   Resp (!) 22   SpO2 98%   The patient was urged to return to the Emergency Department immediately with worsening of current symptoms, worsening abdominal pain, persistent vomiting, blood noted in stools, fever, or any other concerns. The patient verbalized understanding.      Carlisle Cater, PA-C 01/25/18 1759    Sherwood Gambler, MD 01/30/18 [phone]

## 2018-01-26 ENCOUNTER — Ambulatory Visit: Payer: Self-pay | Admitting: Pharmacist

## 2018-01-26 ENCOUNTER — Other Ambulatory Visit: Payer: Self-pay | Admitting: Pharmacist

## 2018-01-26 NOTE — Patient Outreach (Signed)
[facility]) Care Management  01/26/2018  [name] 04/12/1951 [id number]  Care coordination call to [name]: Levalbuterol inhaler co-pay is $3.80  Successful call to [name]. HIPAA identifiers verified. [name] reports that she has been going to the ED several times in the last few months due to stomach pain.  She reports that all the tests were negative and she thinks it may be due to taking all her medications together in the morning.  We reviewed appropriate dosing strategies for all medications.  I relayed message regarding Levalbuterol price to [name] who voiced understanding.  She states this new inhaler has been very helpful with her breathing.  She is planning on picking it up from the pharmacy today.    Plan: I will close [name] pharmacy case at this time.  I am happy to assist in the future as needed.   Ralene Bathe, PharmD, [place] [phone]

## 2018-01-30 ENCOUNTER — Other Ambulatory Visit: Payer: Self-pay

## 2018-01-30 ENCOUNTER — Encounter: Payer: Self-pay | Admitting: Internal Medicine

## 2018-01-30 ENCOUNTER — Telehealth (HOSPITAL_COMMUNITY): Payer: Self-pay | Admitting: *Deleted

## 2018-01-30 ENCOUNTER — Telehealth: Payer: Self-pay | Admitting: Internal Medicine

## 2018-01-30 ENCOUNTER — Ambulatory Visit (INDEPENDENT_AMBULATORY_CARE_PROVIDER_SITE_OTHER): Payer: Medicare Other | Admitting: Internal Medicine

## 2018-01-30 ENCOUNTER — Telehealth: Payer: Self-pay

## 2018-01-30 DIAGNOSIS — K921 Melena: Secondary | ICD-10-CM | POA: Insufficient documentation

## 2018-01-30 DIAGNOSIS — Z7901 Long term (current) use of anticoagulants: Secondary | ICD-10-CM

## 2018-01-30 DIAGNOSIS — R079 Chest pain, unspecified: Secondary | ICD-10-CM

## 2018-01-30 DIAGNOSIS — I480 Paroxysmal atrial fibrillation: Secondary | ICD-10-CM | POA: Diagnosis not present

## 2018-01-30 DIAGNOSIS — R1013 Epigastric pain: Secondary | ICD-10-CM

## 2018-01-30 DIAGNOSIS — R5383 Other fatigue: Secondary | ICD-10-CM | POA: Diagnosis not present

## 2018-01-30 MED ORDER — SUCRALFATE 1 GM/10ML PO SUSP: 1.0000 g | Freq: Four times a day (QID) | ORAL | 1 refills | Status: DC

## 2018-01-30 NOTE — Telephone Encounter (Signed)
Pls call patient regarding medicine for stomach

## 2018-01-30 NOTE — Progress Notes (Signed)
   CC: melena, abdominal pain  HPI:  [name] is a 67 y.o. female with past medical history as documented below presenting with a chief complaint of melena and abdominal pain.  Patient states that she has noticed black stools for 2 weeks duration.  She also been having burning abdominal pain located in the epigastric area following bowel movements.  This pain sometimes occurs in the mornings as well.  It is not worse after food.  She denies bright red blood per rectum, vomiting, or nausea.  She states she has been having one bowel movement every other day.  She uses MiraLAX and prune juice for constipation.  She is on Xarelto for paroxysmal A. fib.  She denies NSAID use.  She has Pepto-Bismol listed on her med list, which she states she uses intermittently but has not been using excessively within the past 2 weeks.  She endorses fatigue and intermittent chest pain.  She attributes to intermittent chest pain or paroxysmal A. fib.  She denies shortness of breath on exertion or dizziness/lightheadedness.  Past Medical History:  Diagnosis Date  . A-fib ([place])   . Allergy    seasonal  . Asthma   . Chronic anticoagulation   . GERD (gastroesophageal reflux disease)   . Hypertension   . PAF (paroxysmal atrial fibrillation) ([place]) 11/26/2015   April/May 2017: Multiple ED visits for A. Fib.  02/12/16: Improved on diltiazem IV. Cardiology consulted in the ED and discharge patient with diltiazem XR 120 mg once daily with 60 mg by mouth every 6 hours as needed for symptomatic palpitations, flecainide 50 mg twice daily. Referred to the atrial fibrillation clinic.  02/17/16: Seen in the atrial fibrillation clinic by NP Roderic Palau. Normal sin   Review of Systems:   Review of Systems  Constitutional: Positive for malaise/fatigue. Negative for chills and fever.  Respiratory: Negative for sputum production.   Cardiovascular: Positive for chest pain. Negative for palpitations and leg swelling.    Gastrointestinal: Positive for abdominal pain and melena. Negative for blood in stool, nausea and vomiting.  Neurological: Negative for dizziness.    Physical Exam:  Vitals:   01/30/18 1530  BP: (!) 143/91  Pulse: 84  Temp: 98.5 F (36.9 C)  TempSrc: Oral  SpO2: 99%  Weight: 158 lb 3.2 oz (71.8 kg)  Height: 5\' 7"  (1.702 m)   Physical Exam  Constitutional: She appears well-developed and well-nourished. No distress.  HENT:  Head: Normocephalic and atraumatic.  Mouth/Throat: Oropharynx is clear and moist.  Eyes: Conjunctivae are normal. No scleral icterus.  Cardiovascular: Normal rate and regular rhythm.  Pulmonary/Chest: Effort normal and breath sounds normal.  Abdominal: Soft. Bowel sounds are normal. There is tenderness in the suprapubic area.  Genitourinary: Rectum normal. Rectal exam shows no mass, no tenderness, anal tone normal and guaiac negative stool.    Assessment & Plan:   See Encounters Tab for problem based charting.  Patient discussed with Dr. Evette Doffing

## 2018-01-30 NOTE — Telephone Encounter (Signed)
Called pt, she states her bm x 2 weeks have been black and she is having low back pain- ACC 6/10 at 1545

## 2018-01-30 NOTE — Patient Instructions (Signed)
Ms. Kuipers,   I am going to refer you to [facility].   I am checking your blood counts and iron levels today and will call you with the results.

## 2018-01-30 NOTE — Telephone Encounter (Signed)
Needs to speak with a nurse about something. Please call back.  

## 2018-01-30 NOTE — Assessment & Plan Note (Signed)
Patient presenting with 2 weeks of melena and burning epigastric abdominal pain following bowel movements.  Patient is high risk for upper GI bleed as she is on Xarelto.  Denies NSAIDs, heartburn/reflux symptoms, BRBPR, N/V/D.  Endorses fatigue, but denies lightheadedness/dizziness or shortness of breath on exertion.  CBC 5 days ago with hemoglobin within normal limits.  Patient is currently on Nexium 20 mg daily.  She has also been prescribed Bentyl and tramadol, which she states has not helped the pain after bowel movements.  Abdominal exam with suprapubic tenderness to palpation, but no other focal findings.  Concerned that the patient is having upper GI bleed as she is high risk on Xarelto.  Is possible that the burning sensation is gastritis or peptic ulcer disease. Will refer to [facility] for EGD.  Plan: -GI referral for possible EGD -Carafate QID -CBC and ferritin

## 2018-01-30 NOTE — Telephone Encounter (Signed)
Pt cld reporting that she has been taking an antibiotic for urinary tract infection.  Pt now reports black stools for 2 weeks and wants to know if she should continue blood thinner.  She was advised to continue and see PCP today.  They will let you know if any blood is found in the stool and if any meds need to be continued.

## 2018-01-30 NOTE — Telephone Encounter (Signed)
Acc 6/10 at 1545

## 2018-01-31 LAB — CBC
Hematocrit: 39.6 % (ref 34.0–46.6)
Hemoglobin: 13 g/dL (ref 11.1–15.9)
MCH: 26.3 pg — ABNORMAL LOW (ref 26.6–33.0)
MCHC: 32.8 g/dL (ref 31.5–35.7)
MCV: 80 fL (ref 79–97)
Platelets: 283 x10E3/uL (ref 150–450)
RBC: 4.95 x10E6/uL (ref 3.77–5.28)
RDW: 15.6 % — ABNORMAL HIGH (ref 12.3–15.4)
WBC: 4.4 x10E3/uL (ref 3.4–10.8)

## 2018-01-31 LAB — FERRITIN: Ferritin: 53 ng/mL (ref 15–150)

## 2018-01-31 NOTE — Progress Notes (Signed)
Internal Medicine Clinic Attending  Case discussed with Dr. Aggie Hacker  at the time of the visit.  We reviewed the resident's history and exam and pertinent patient test results.  I agree with the assessment, diagnosis, and plan of care documented in the resident's note.  New onset, subacute, upper dyspepsia/burning pain; advanced age is a red flag. Recent CT abdomen is reassuring, CBC and ferritin also stable with no signs of iron deficiency or anemia. I think the change in stool color is most likely due to pepto bismol products. Plan is to refer her for non-urgent EGD with GI to evaluate her symptoms and rule our esophageal or gastric malignancy.

## 2018-02-02 ENCOUNTER — Telehealth: Payer: Self-pay | Admitting: Internal Medicine

## 2018-02-02 NOTE — Telephone Encounter (Signed)
Left message for patient regarding lab results from recent clinic visit.  CBC and ferritin within normal limits.  Also calling to discuss her abdominal pain and symptoms and if the Carafate has helped.  Instructed patient to call back clinic if she has been questions regarding her labs.

## 2018-02-04 ENCOUNTER — Emergency Department (HOSPITAL_COMMUNITY)
Admission: EM | Admit: 2018-02-04 | Discharge: 2018-02-04 | Disposition: A | Discharge status: Home / Self Care | Payer: Medicare Other | Attending: Emergency Medicine | Admitting: Emergency Medicine

## 2018-02-04 ENCOUNTER — Other Ambulatory Visit: Payer: Self-pay

## 2018-02-04 ENCOUNTER — Encounter (HOSPITAL_COMMUNITY): Payer: Self-pay | Admitting: Emergency Medicine

## 2018-02-04 DIAGNOSIS — I10 Essential (primary) hypertension: Secondary | ICD-10-CM | POA: Insufficient documentation

## 2018-02-04 DIAGNOSIS — J45909 Unspecified asthma, uncomplicated: Secondary | ICD-10-CM | POA: Diagnosis not present

## 2018-02-04 DIAGNOSIS — R1031 Right lower quadrant pain: Secondary | ICD-10-CM | POA: Insufficient documentation

## 2018-02-04 DIAGNOSIS — R310 Gross hematuria: Secondary | ICD-10-CM | POA: Diagnosis not present

## 2018-02-04 DIAGNOSIS — Z79899 Other long term (current) drug therapy: Secondary | ICD-10-CM | POA: Insufficient documentation

## 2018-02-04 DIAGNOSIS — Z87891 Personal history of nicotine dependence: Secondary | ICD-10-CM | POA: Diagnosis not present

## 2018-02-04 DIAGNOSIS — Z7901 Long term (current) use of anticoagulants: Secondary | ICD-10-CM | POA: Diagnosis not present

## 2018-02-04 LAB — CBC
HCT: 38.3 % (ref 36.0–46.0)
HEMOGLOBIN: 12.2 g/dL (ref 12.0–15.0)
MCH: 26.1 pg (ref 26.0–34.0)
MCHC: 31.9 g/dL (ref 30.0–36.0)
MCV: 82 fL (ref 78.0–100.0)
Platelets: 233 10*3/uL (ref 150–400)
RBC: 4.67 MIL/uL (ref 3.87–5.11)
RDW: 15.6 % — AB (ref 11.5–15.5)
WBC: 4 10*3/uL (ref 4.0–10.5)

## 2018-02-04 LAB — COMPREHENSIVE METABOLIC PANEL
ALT: 34 U/L (ref 14–54)
AST: 24 U/L (ref 15–41)
Albumin: 3.8 g/dL (ref 3.5–5.0)
Alkaline Phosphatase: 87 U/L (ref 38–126)
Anion gap: 7 (ref 5–15)
BUN: 9 mg/dL (ref 6–20)
CHLORIDE: 108 mmol/L (ref 101–111)
CO2: 24 mmol/L (ref 22–32)
CREATININE: 0.71 mg/dL (ref 0.44–1.00)
Calcium: 9.4 mg/dL (ref 8.9–10.3)
GFR calc Af Amer: >60 mL/min (ref >60)
Glucose, Bld: 96 mg/dL (ref 65–99)
POTASSIUM: 3.3 mmol/L — AB (ref 3.5–5.1)
Sodium: 139 mmol/L (ref 135–145)
Total Bilirubin: 0.2 mg/dL — ABNORMAL LOW (ref 0.3–1.2)
Total Protein: 7 g/dL (ref 6.5–8.1)

## 2018-02-04 LAB — HEMOGLOBIN AND HEMATOCRIT, BLOOD
HEMATOCRIT: 40.6 % (ref 36.0–46.0)
HEMOGLOBIN: 12.6 g/dL (ref 12.0–15.0)

## 2018-02-04 LAB — URINALYSIS, ROUTINE W REFLEX MICROSCOPIC
Bilirubin Urine: NEGATIVE
GLUCOSE, UA: NEGATIVE mg/dL
Ketones, ur: NEGATIVE mg/dL
Leukocytes, UA: NEGATIVE
NITRITE: NEGATIVE
PH: 6 (ref 5.0–8.0)
Protein, ur: NEGATIVE mg/dL
SPECIFIC GRAVITY, URINE: 1.012 (ref 1.005–1.030)

## 2018-02-04 LAB — ABO/RH: ABO/RH(D): O POS

## 2018-02-04 LAB — TYPE AND SCREEN
ABO/RH(D): O POS
Antibody Screen: NEGATIVE

## 2018-02-04 LAB — LIPASE, BLOOD: LIPASE: 33 U/L (ref 11–51)

## 2018-02-04 MED ORDER — HYDROCODONE-ACETAMINOPHEN 5-325 MG PO TABS: 1.0000 | ORAL_TABLET | Freq: Four times a day (QID) | ORAL | 0 refills | Status: DC | PRN

## 2018-02-04 MED ORDER — HYDROCODONE-ACETAMINOPHEN 5-325 MG PO TABS
1.0000 | ORAL_TABLET | Freq: Once | ORAL | Status: AC
  Administered 2018-02-04: 1 via ORAL
  Filled 2018-02-04: qty 1

## 2018-02-04 NOTE — ED Provider Notes (Signed)
[facility] EMERGENCY DEPARTMENT Provider Note   CSN: [id number] Arrival date & time: 02/04/18  0300     History   Chief Complaint Chief Complaint  Patient presents with  . Abdominal Pain  . Hematuria    HPI [name] is a 67 y.o. female.  Patient is a 67 year old female with a history of asthma, hypertension, paroxysmal atrial fibrillation status post ablation in February on Xarelto presenting today because at 3 AM she woke up and had blood in her urine.  Patient states that she has been taking her medications as prescribed and last dose of Xarelto was last night.  She complains of pain in her right lower quadrant but with further questioning patient states that is been coming and going since her ablation in February.  She states it does not seem to be associated with any particular movements or eating but when it comes it feels like a really bad cramp.  It is not associated with urinating and she does not have pain down in her leg.  She denies any back pain and has had no nausea or vomiting.  Patient does state that she was seen by her doctor earlier this month because she had noticed black stools for several weeks and per office notes she was having symptoms of dyspepsia.  At that time she had her hemoglobin checked as well as her iron which were within normal limits and she was seen in the emergency room for chest and abdominal pain also at the beginning of this month and had a CAT scan which showed no acute process.  Specifically no evidence of diverticulitis or appendicitis.  She also had a renal stone study on 5/22 that showed a small renal stone in the right kidney.  The history is provided by the patient.    Past Medical History:  Diagnosis Date  . A-fib ([place])   . Allergy    seasonal  . Asthma   . Chronic anticoagulation   . GERD (gastroesophageal reflux disease)   . Hypertension   . PAF (paroxysmal atrial fibrillation) ([place]) 11/26/2015   April/May  2017: Multiple ED visits for A. Fib.  02/12/16: Improved on diltiazem IV. Cardiology consulted in the ED and discharge patient with diltiazem XR 120 mg once daily with 60 mg by mouth every 6 hours as needed for symptomatic palpitations, flecainide 50 mg twice daily. Referred to the atrial fibrillation clinic.  02/17/16: Seen in the atrial fibrillation clinic by NP Roderic Palau. Normal sin    Patient Active Problem List   Diagnosis Date Noted  . Melena 01/30/2018  . Periumbilical abdominal pain 01/20/2018  . Hyperpigmented skin lesion 09/09/2017  . Allergic contact dermatitis 04/27/2017  . Osteoporosis without current pathological fracture 04/15/2017  . Normocytic anemia 10/19/2016  . Hyperlipidemia 03/18/2016  . Special screening for malignant neoplasms, colon 03/17/2016  . Eczema 03/17/2016  . Cervical cancer screening 03/17/2016  . Assistance with transportation 03/17/2016  . Hypertension   . GERD (gastroesophageal reflux disease)   . PAF (paroxysmal atrial fibrillation) ([place]) 11/26/2015  . Chronic anticoagulation 11/26/2015  . Folliculitis 02/63/7858  . Marijuana abuse 06/11/2015  . Asthma, mild intermittent     Past Surgical History:  Procedure Laterality Date  . ATRIAL FIBRILLATION ABLATION N/A 09/30/2017   Procedure: ATRIAL FIBRILLATION ABLATION;  Surgeon: Thompson Grayer, MD;  Location: [facility];  Service: Cardiovascular;  Laterality: N/A;  . TONSILLECTOMY    . TUBAL LIGATION  OB History   None      Home Medications    Prior to Admission medications   Medication Sig Start Date End Date Taking? Authorizing Provider  dicyclomine (BENTYL) 20 MG tablet Take 1 tablet (20 mg total) by mouth 2 (two) times daily. 01/25/18  Yes Carlisle Cater, PA-C  diltiazem (CARDIZEM) 60 MG tablet TAKE 1 TABLET BY MOUTH EVERY 6 HOURS AS NEEDED FOR RAPID AFIB HEART RATE OVER 100 Patient taking differently: TAKE 1 TABLET BY MOUTH EVERY IN THE MORNING 06/10/17  Yes Sherran Needs,  NP  diltiazem (CARTIA XT) 120 MG 24 hr capsule TAKE 1 CAPSULE(120 MG) [place] EVENING Patient taking differently: Take 120 mg by mouth as needed.  01/02/18  Yes Sherran Needs, NP  diphenhydrAMINE (BENADRYL) 25 MG tablet Take 1 tablet (25 mg total) by mouth every 6 (six) hours. Take regularly for 3 days then as needed if rash comes back. 01/01/18  Yes Upstill, Nehemiah Settle, PA-C  esomeprazole (NEXIUM) 20 MG capsule TAKE 1 CAPSULE(20 MG) BY MOUTH DAILY 01/23/18  Yes [name], MD  fluticasone (FLOVENT HFA) 220 MCG/ACT inhaler Inhale 1 puff into the lungs 2 (two) times daily. 01/20/18  Yes [name], MD  levalbuterol [facility] HFA) 45 MCG/ACT inhaler Inhale 1 puff into the lungs every 6 (six) hours as needed for shortness of breath. 11/21/17  Yes Carmin Muskrat, MD  losartan (COZAAR) 50 MG tablet Take 1 tablet (50 mg total) by mouth daily. 10/21/17  Yes [name], MD  montelukast (SINGULAIR) 10 MG tablet Take 1 tablet (10 mg total) by mouth at bedtime. 09/09/17  Yes [name], MD  Multiple Vitamin (MULTI-VITAMIN PO) Take 1 tablet by mouth daily.   Yes [provider]  polyethylene glycol powder (GLYCOLAX/MIRALAX) powder Mix 17g of Miralax in 8 ounces of water every other day Patient taking differently: Take by mouth See admin instructions. Mix 17 g. in 8 oz water and drink daily as needed for constipation 07/20/17  Yes [name], MD  potassium chloride SA (K-DUR,KLOR-CON) 10 MEQ tablet Take 2 tablets (20 mEq total) by mouth daily. Patient taking differently: Take 10 mEq by mouth 2 (two) times daily.  11/12/16  Yes Sherran Needs, NP  rivaroxaban (XARELTO) 20 MG TABS tablet Take 1 tablet (20 mg total) by mouth daily with supper. 10/25/17  Yes Sherran Needs, NP  sucralfate (CARAFATE) 1 GM/10ML suspension Take 10 mLs (1 g total) by mouth 4 (four) times daily. 01/30/18 01/30/19 Yes Lacroce, Hulen Shouts, MD  tiotropium (SPIRIVA) 18 MCG inhalation capsule Place 1 capsule (18 mcg total)  into inhaler and inhale daily. 10/21/17  Yes [name], MD  benzonatate (TESSALON PERLES) 100 MG capsule Take 1 capsule (100 mg total) by mouth 3 (three) times daily as needed for cough. Patient not taking: Reported on 02/04/2018 11/14/17 11/14/18  [name], MD  magnesium hydroxide (MILK OF MAGNESIA) 400 MG/5ML suspension Take 15-30 mLs by mouth at bedtime as needed for mild constipation. Patient not taking: Reported on 02/04/2018 11/01/17   Margarita Mail, PA-C  simethicone (GAS-X) 80 MG chewable tablet Chew 1 tablet (80 mg total) by mouth every 6 (six) hours as needed for flatulence. Patient not taking: Reported on 02/04/2018 11/01/17   Margarita Mail, PA-C    Family History Family History  Problem Relation Age of Onset  . Heart attack Mother        [name] age  . Breast cancer Sister  Late 70s; now s/p mastectomy   . Lung cancer Brother        x 2  . Cancer Sister   . Cancer Sister   . Cancer Sister   . Colon cancer Neg Hx   . Colon polyps Neg Hx   . Esophageal cancer Neg Hx   . Rectal cancer Neg Hx   . Stomach cancer Neg Hx     Social History Social History   Tobacco Use  . Smoking status: Former Smoker    Last attempt to quit: 10/24/2015    Years since quitting: 2.2  . Smokeless tobacco: Never Used  Substance Use Topics  . Alcohol use: No  . Drug use: No    Types: Marijuana    Comment: Smoked marijuana in the past.     Allergies   Albuterol; Celecoxib; and Protonix [pantoprazole sodium]   Review of Systems Review of Systems  All other systems reviewed and are negative.    Physical Exam Updated Vital Signs BP (!) 152/93 (BP Location: Right Arm)   Pulse 73   Temp 98.3 F (36.8 C) (Oral)   Resp 16   SpO2 100%   Physical Exam  Constitutional: She is oriented to person, place, and time. She appears well-developed and well-nourished. No distress.  HENT:  Head: Normocephalic and atraumatic.  Mouth/Throat: Oropharynx is clear and moist.  Eyes:  Pupils are equal, round, and reactive to light. Conjunctivae and EOM are normal.  Neck: Normal range of motion. Neck supple.  Cardiovascular: Normal rate, regular rhythm and intact distal pulses.  No murmur heard. Pulmonary/Chest: Effort normal and breath sounds normal. No respiratory distress. She has no wheezes. She has no rales.  Abdominal: Soft. She exhibits no distension. There is tenderness in the right lower quadrant. There is no rebound, no guarding and no CVA tenderness.  Mild right lower quadrant tenderness but no rebound or guarding present.  No bruit heard over the femoral artery  Musculoskeletal: Normal range of motion. She exhibits no edema or tenderness.  Neurological: She is alert and oriented to person, place, and time.  Skin: Skin is warm and dry. No rash noted. No erythema.  Psychiatric: She has a normal mood and affect. Her behavior is normal.  Nursing note and vitals reviewed.    ED Treatments / Results  Labs (all labs ordered are listed, but only abnormal results are displayed) Labs Reviewed  COMPREHENSIVE METABOLIC PANEL - Abnormal; Notable for the following components:      Result Value   Potassium 3.3 (*)    Total Bilirubin 0.2 (*)    All other components within normal limits  CBC - Abnormal; Notable for the following components:   RDW 15.6 (*)    All other components within normal limits  URINALYSIS, ROUTINE W REFLEX MICROSCOPIC - Abnormal; Notable for the following components:   Hgb urine dipstick MODERATE (*)    Bacteria, UA RARE (*)    All other components within normal limits  LIPASE, BLOOD  HEMOGLOBIN AND HEMATOCRIT, BLOOD  POC OCCULT BLOOD, ED  TYPE AND SCREEN  ABO/RH    EKG EKG Interpretation  Date/Time:  Saturday February 04 2018 08:16:28 EDT Ventricular Rate:  75 PR Interval:    QRS Duration: 95 QT Interval:  406 QTC Calculation: 454 R Axis:   28 Text Interpretation:  Age not entered, assumed to be  67 years old for purpose of ECG  interpretation Sinus rhythm Abnormal R-wave progression, early transition No significant change since last  tracing Confirmed by Blanchie Dessert [phone]) on 02/04/2018 8:49:25 AM   Radiology No results found.  Procedures Procedures (including critical care time)  Medications Ordered in ED Medications  HYDROcodone-acetaminophen (NORCO/VICODIN) 5-325 MG per tablet 1 tablet (1 tablet Oral Given 02/04/18 0732)     Initial Impression / Assessment and Plan / ED Course  I have reviewed the triage vital signs and the nursing notes.  Pertinent labs & imaging results that were available during my care of the patient were reviewed by me and considered in my medical decision making (see chart for details).     Patient presenting today because of complaints of right lower quadrant pain and hematuria.  It is not completely clear when the right lower quadrant pain began.  She is currently having it but upon history taking it seems that is been coming and going since her ablation.  However today is the first day she is noticed hematuria which started at 3 AM.  She has no dysuria, frequency or urgency.  She denies any nausea or vomiting.  Patient has had 2 CT scans within the last 1 month 1 renal stone study that did show a small kidney stone in the right kidney which could be the cause of her symptoms today and a contrasted scan done 10 days ago which was within normal limits.  Patient is well-appearing and currently in sinus rhythm.  Hemoglobin is stable at 12 and since she has been here for 6 hours we will repeat to ensure no drop in hemoglobin.  Renal function is otherwise stable and lipase is within normal limits.  Patient did see her PCP approximately 10 days ago for dark stools and dyspepsia.  She states her stool is now normal color and she is still taking Nexium.  She had hemoglobin and iron studies done which were normal.  She is requesting medication for the pain she was given 1 Vicodin.  8:50  AM Patient's pain is completely gone after Vicodin.  Concerned that patient's symptoms may be related to a stone however at this point the size of stone that was seen in the past was so small it should pass without any consequence.  Patient has follow-up with GI on Friday.  She otherwise is hemodynamically stable.  Repeat H&H is stable and feel that patient is safe for discharge home.  She will hold her Xarelto for 2 days and if she is still seeing blood in her urine she will call her doctor before resuming the Xarelto. Final Clinical Impressions(s) / ED Diagnoses   Final diagnoses:  Gross hematuria  Colicky RLQ abdominal pain    ED Discharge Orders        Ordered    HYDROcodone-acetaminophen (NORCO/VICODIN) 5-325 MG tablet  Every 6 hours PRN     02/04/18 0911       Blanchie Dessert, MD 02/04/18 9021

## 2018-02-04 NOTE — ED Triage Notes (Signed)
C/o black stools x 2 weeks with sharp RLQ pain and headache.  States just pta she started having large amount of bleeding when urinating.  Pt takes Xarelto.

## 2018-02-04 NOTE — ED Notes (Signed)
Patient c/o abd. Pain states for the past several days she has had black tarry stools then that stopped c/o bright red rectal bleeding onset 3 am today , states the pain is sharp in nature.

## 2018-02-04 NOTE — Discharge Instructions (Signed)
Stop your Xarelto for the next 2-3 days and if no further blood in the urine then restart.  If there is still blood in your urine in 2-3 days call your doctor before re-starting Xarelto.

## 2018-02-10 ENCOUNTER — Telehealth: Payer: Self-pay | Admitting: Gastroenterology

## 2018-02-10 ENCOUNTER — Other Ambulatory Visit: Payer: Self-pay | Admitting: *Deleted

## 2018-02-10 ENCOUNTER — Encounter: Payer: Self-pay | Admitting: Gastroenterology

## 2018-02-10 ENCOUNTER — Ambulatory Visit (INDEPENDENT_AMBULATORY_CARE_PROVIDER_SITE_OTHER): Payer: Medicare Other | Admitting: Gastroenterology

## 2018-02-10 VITALS — BP 120/80 | HR 68 | Ht 67.0 in | Wt 156.0 lb

## 2018-02-10 DIAGNOSIS — R1084 Generalized abdominal pain: Secondary | ICD-10-CM

## 2018-02-10 DIAGNOSIS — K5900 Constipation, unspecified: Secondary | ICD-10-CM

## 2018-02-10 MED ORDER — LINACLOTIDE 145 MCG PO CAPS: 145.0000 ug | ORAL_CAPSULE | Freq: Every day | ORAL | 0 refills | Status: DC

## 2018-02-10 NOTE — Patient Outreach (Signed)
[facility]) Care Management  02/10/2018  [name] July 24, 1951 [id number]   RN Health Coach telephone call to patient.  [employer] compliance verified. Per patient she is having a constant nagging pain in her rt lower abdomen. Per patient she is not having tarry stools anymore. Patient stated the bleeding has stopped in her urine. Patent stated she is doing good with her COPD. Patient is in the green zone. Per patient she is using her inhalers as directed and is doing much better. Patient is getting ready to go see the gastroenterologist now.Patient has agreed to follow up outreach calls  Appointments [phone] Gastroenterologist [phone] Cardiologist [phone] F/U with Primary car  PLAN: Patient is going to gastroenterologist today Patient is using inhalers as directed RN will follow up within then month of July

## 2018-02-10 NOTE — Patient Instructions (Addendum)
If you are age 69 or older, your body mass index should be between 23-30. Your Body mass index is 24.43 kg/m. If this is out of the aforementioned range listed, please consider follow up with your Primary Care Provider.  If you are age 17 or younger, your body mass index should be between 19-25. Your Body mass index is 24.43 kg/m. If this is out of the aformentioned range listed, please consider follow up with your Primary Care Provider.   You have been given samples of Linzess 145 mcg take once daily for 10 days.  Call back in 7-10 days with an update.  Ask for Chong Sicilian, RN.  Thank you for choosing me and [facility].   Alonza Bogus, PA-C

## 2018-02-10 NOTE — Telephone Encounter (Signed)
I spoke with the patient and told her that at this time she should just take the [place].  She is to let us know next week how it is working.

## 2018-02-10 NOTE — Progress Notes (Signed)
02/10/2018 [name] [id number] November 21, 1950   HISTORY OF PRESENT ILLNESS: This is a 67 year old female who is a patient of Dr. Doyne Keel.  She had a colonoscopy in April 2018 at which time she was found to have a tortuous colon, internal hemorrhoids, and a few polyps that were removed, one was a tubular adenoma and the other were benign colonic mucosa.  She presents to our office today with complaints of generalized abdominal pain.  She says it is mostly in her lower abdomen and sometimes radiates upward.  She says that she has been having trouble moving her bowels and has been taking Dulcolax frequently to help her do that.  She reports that she has been taking Pepto-Bismol to help with her symptoms that had resulted in some black stools.  She had tried MiraLAX to help her move her bowels in the past, but says that that that did not help with her constipation previously.  She has tried dicyclomine for this abdominal pain, but does not think that it helped.  She is on Nexium 20 mg daily for reflux and she was actually given Carafate suspension as well, but she does not feel like that made a difference in her symptoms either.  She had a CT scan of the abdomen pelvis with contrast that showed no acute intra-abdominal process.  It did show aortic atherosclerosis.  Recent CBC, CMP, lipase were all unremarkable.   Past Medical History:  Diagnosis Date  . A-fib ([place])   . Allergy    seasonal  . Asthma   . Chronic anticoagulation   . GERD (gastroesophageal reflux disease)   . Hypertension   . PAF (paroxysmal atrial fibrillation) ([place]) 11/26/2015   April/May 2017: Multiple ED visits for A. Fib.  02/12/16: Improved on diltiazem IV. Cardiology consulted in the ED and discharge patient with diltiazem XR 120 mg once daily with 60 mg by mouth every 6 hours as needed for symptomatic palpitations, flecainide 50 mg twice daily. Referred to the atrial fibrillation clinic.  02/17/16: Seen in the atrial  fibrillation clinic by NP Roderic Palau. Normal sin   Past Surgical History:  Procedure Laterality Date  . ATRIAL FIBRILLATION ABLATION N/A 09/30/2017   Procedure: ATRIAL FIBRILLATION ABLATION;  Surgeon: Thompson Grayer, MD;  Location: [facility];  Service: Cardiovascular;  Laterality: N/A;  . TONSILLECTOMY    . TUBAL LIGATION      reports that she quit smoking about 2 years ago. She has never used smokeless tobacco. She reports that she does not drink alcohol or use drugs. family history includes Breast cancer in her sister; Cancer in her sister, sister, and sister; Heart attack in her mother; Lung cancer in her brother. Allergies  Allergen Reactions  . Albuterol Other (See Comments)    Caused afib   . Celecoxib Rash  . Protonix [Pantoprazole Sodium] Palpitations      Outpatient Encounter Medications as of 02/10/2018  Medication Sig  . benzonatate (TESSALON PERLES) 100 MG capsule Take 1 capsule (100 mg total) by mouth 3 (three) times daily as needed for cough.  . dicyclomine (BENTYL) 20 MG tablet Take 1 tablet (20 mg total) by mouth 2 (two) times daily.  Marland Kitchen diltiazem (CARDIZEM) 60 MG tablet TAKE 1 TABLET BY MOUTH EVERY 6 HOURS AS NEEDED FOR RAPID AFIB HEART RATE OVER 100 (Patient taking differently: TAKE 1 TABLET BY MOUTH EVERY IN THE MORNING)  . diltiazem (CARTIA XT) 120 MG 24 hr capsule TAKE 1 CAPSULE(120 MG)  BY MOUTH EVERY EVENING (Patient taking differently: Take 120 mg by mouth as needed. )  . diphenhydrAMINE (BENADRYL) 25 MG tablet Take 1 tablet (25 mg total) by mouth every 6 (six) hours. Take regularly for 3 days then as needed if rash comes back.  Marland Kitchen esomeprazole (NEXIUM) 20 MG capsule TAKE 1 CAPSULE(20 MG) BY MOUTH DAILY  . fluticasone (FLOVENT HFA) 220 MCG/ACT inhaler Inhale 1 puff into the lungs 2 (two) times daily.  Marland Kitchen HYDROcodone-acetaminophen (NORCO/VICODIN) 5-325 MG tablet Take 1 tablet by mouth every 6 (six) hours as needed for severe pain.  Marland Kitchen levalbuterol (XOPENEX HFA)  45 MCG/ACT inhaler Inhale 1 puff into the lungs every 6 (six) hours as needed for shortness of breath.  . losartan (COZAAR) 50 MG tablet Take 1 tablet (50 mg total) by mouth daily.  . magnesium hydroxide (MILK OF MAGNESIA) 400 MG/5ML suspension Take 15-30 mLs by mouth at bedtime as needed for mild constipation.  . montelukast (SINGULAIR) 10 MG tablet Take 1 tablet (10 mg total) by mouth at bedtime.  . Multiple Vitamin (MULTI-VITAMIN PO) Take 1 tablet by mouth daily.  . polyethylene glycol powder (GLYCOLAX/MIRALAX) powder Mix 17g of Miralax in 8 ounces of water every other day (Patient taking differently: Take by mouth See admin instructions. Mix 17 g. in 8 oz water and drink daily as needed for constipation)  . potassium chloride SA (K-DUR,KLOR-CON) 10 MEQ tablet Take 2 tablets (20 mEq total) by mouth daily. (Patient taking differently: Take 10 mEq by mouth 2 (two) times daily. )  . rivaroxaban (XARELTO) 20 MG TABS tablet Take 1 tablet (20 mg total) by mouth daily with supper.  . simethicone (GAS-X) 80 MG chewable tablet Chew 1 tablet (80 mg total) by mouth every 6 (six) hours as needed for flatulence.  . sucralfate (CARAFATE) 1 GM/10ML suspension Take 10 mLs (1 g total) by mouth 4 (four) times daily.  Marland Kitchen tiotropium (SPIRIVA) 18 MCG inhalation capsule Place 1 capsule (18 mcg total) into inhaler and inhale daily.   No facility-administered encounter medications on file as of 02/10/2018.      REVIEW OF SYSTEMS  : All other systems reviewed and negative except where noted in the History of Present Illness.   PHYSICAL EXAM: BP 120/80   Pulse 68   Ht 5\' 7"  (1.702 m)   Wt 156 lb (70.8 kg)   BMI 24.43 kg/m  General: Well developed black female in no acute distress Head: Normocephalic and atraumatic Eyes:  Sclerae anicteric, conjunctiva pink. Ears: Normal auditory acuity Lungs: Clear throughout to auscultation; no increased WOB. Heart: Regular rate and rhythm; no M/R/G. Abdomen: Soft,  non-distended.  BS present.  Mild diffuse TTP. Musculoskeletal: Symmetrical with no gross deformities  Skin: No lesions on visible extremities Extremities: No edema  Neurological: Alert oriented x 4, grossly non-focal Psychological:  Alert and cooperative. Normal mood and affect  ASSESSMENT AND PLAN: *Constipation and generalized abdominal pain.  Recent CT scan unremarkable.  Colonoscopy last year.  Labs ok.  Will start Linzess 145 mcg daily.  Samples were given for 10 days.  I have asked her to call back in 7 to 10 days with an update on her symptoms.   CC:  Melanee Spry, MD

## 2018-02-10 NOTE — Progress Notes (Signed)
Agree with assessment and plan as outlined.  

## 2018-02-13 ENCOUNTER — Emergency Department (HOSPITAL_COMMUNITY)
Admission: EM | Admit: 2018-02-13 | Discharge: 2018-02-13 | Disposition: A | Discharge status: Home / Self Care | Payer: Medicare Other | Attending: Emergency Medicine | Admitting: Emergency Medicine

## 2018-02-13 ENCOUNTER — Emergency Department (HOSPITAL_COMMUNITY): Payer: Medicare Other

## 2018-02-13 ENCOUNTER — Encounter (HOSPITAL_COMMUNITY): Payer: Self-pay | Admitting: Emergency Medicine

## 2018-02-13 DIAGNOSIS — R0602 Shortness of breath: Secondary | ICD-10-CM | POA: Insufficient documentation

## 2018-02-13 DIAGNOSIS — Z5321 Procedure and treatment not carried out due to patient leaving prior to being seen by health care provider: Secondary | ICD-10-CM | POA: Insufficient documentation

## 2018-02-13 DIAGNOSIS — R079 Chest pain, unspecified: Secondary | ICD-10-CM | POA: Diagnosis not present

## 2018-02-13 LAB — CBC
HEMATOCRIT: 40.5 % (ref 36.0–46.0)
HEMOGLOBIN: 12.6 g/dL (ref 12.0–15.0)
MCH: 25.6 pg — ABNORMAL LOW (ref 26.0–34.0)
MCHC: 31.1 g/dL (ref 30.0–36.0)
MCV: 82.3 fL (ref 78.0–100.0)
Platelets: 253 10*3/uL (ref 150–400)
RBC: 4.92 MIL/uL (ref 3.87–5.11)
RDW: 15.7 % — AB (ref 11.5–15.5)
WBC: 3.6 10*3/uL — AB (ref 4.0–10.5)

## 2018-02-13 LAB — BASIC METABOLIC PANEL
ANION GAP: 9 (ref 5–15)
BUN: 11 mg/dL (ref 6–20)
CHLORIDE: 108 mmol/L (ref 101–111)
CO2: 24 mmol/L (ref 22–32)
Calcium: 10.2 mg/dL (ref 8.9–10.3)
Creatinine, Ser: 0.67 mg/dL (ref 0.44–1.00)
GFR calc Af Amer: >60 mL/min (ref >60)
Glucose, Bld: 101 mg/dL — ABNORMAL HIGH (ref 65–99)
Potassium: 3.2 mmol/L — ABNORMAL LOW (ref 3.5–5.1)
SODIUM: 141 mmol/L (ref 135–145)

## 2018-02-13 LAB — I-STAT TROPONIN, ED: Troponin i, poc: 0 ng/mL (ref 0.00–0.08)

## 2018-02-13 NOTE — Telephone Encounter (Signed)
Pt states she has been taking Linzess everyday but now is having trouble breathing best call back# [phone].

## 2018-02-13 NOTE — ED Triage Notes (Signed)
Pt reports having CP/SOB for two days. Pt has been using her inhaler with no relief. CP is nonradiating. Denies N/V

## 2018-02-13 NOTE — ED Notes (Signed)
Pt called several times to room. No answer

## 2018-02-13 NOTE — Telephone Encounter (Signed)
I spoke with the pt and she states the linzess is working good, she has 1-2 bowel movements daily.  She does mention that today around 2 pm she began having SOB and chest pain.  She has a cardiac history of A fib per pt.  She was advised to call 911 for the chest pain and SOB.  The pt agreed and has a family member with her until EMS arrives.  I will also send to Alonza Bogus for review.

## 2018-02-13 NOTE — ED Notes (Signed)
Called in wt room with no answer

## 2018-02-14 ENCOUNTER — Telehealth: Payer: Self-pay | Admitting: Gastroenterology

## 2018-02-14 ENCOUNTER — Emergency Department (HOSPITAL_COMMUNITY): Payer: Medicare Other

## 2018-02-14 ENCOUNTER — Encounter (HOSPITAL_COMMUNITY): Payer: Self-pay | Admitting: Emergency Medicine

## 2018-02-14 ENCOUNTER — Emergency Department (HOSPITAL_COMMUNITY)
Admission: EM | Admit: 2018-02-14 | Discharge: 2018-02-14 | Disposition: A | Discharge status: Home / Self Care | Payer: Medicare Other | Attending: Emergency Medicine | Admitting: Emergency Medicine

## 2018-02-14 DIAGNOSIS — R1013 Epigastric pain: Secondary | ICD-10-CM | POA: Diagnosis not present

## 2018-02-14 DIAGNOSIS — R3129 Other microscopic hematuria: Secondary | ICD-10-CM | POA: Insufficient documentation

## 2018-02-14 DIAGNOSIS — I10 Essential (primary) hypertension: Secondary | ICD-10-CM | POA: Insufficient documentation

## 2018-02-14 DIAGNOSIS — R0789 Other chest pain: Secondary | ICD-10-CM | POA: Diagnosis not present

## 2018-02-14 DIAGNOSIS — Z79899 Other long term (current) drug therapy: Secondary | ICD-10-CM | POA: Insufficient documentation

## 2018-02-14 DIAGNOSIS — R079 Chest pain, unspecified: Secondary | ICD-10-CM | POA: Diagnosis not present

## 2018-02-14 DIAGNOSIS — J45909 Unspecified asthma, uncomplicated: Secondary | ICD-10-CM | POA: Insufficient documentation

## 2018-02-14 DIAGNOSIS — R0602 Shortness of breath: Secondary | ICD-10-CM | POA: Insufficient documentation

## 2018-02-14 DIAGNOSIS — R197 Diarrhea, unspecified: Secondary | ICD-10-CM | POA: Insufficient documentation

## 2018-02-14 DIAGNOSIS — R0981 Nasal congestion: Secondary | ICD-10-CM | POA: Diagnosis not present

## 2018-02-14 DIAGNOSIS — Z7901 Long term (current) use of anticoagulants: Secondary | ICD-10-CM | POA: Insufficient documentation

## 2018-02-14 DIAGNOSIS — Z87891 Personal history of nicotine dependence: Secondary | ICD-10-CM | POA: Diagnosis not present

## 2018-02-14 DIAGNOSIS — R1030 Lower abdominal pain, unspecified: Secondary | ICD-10-CM | POA: Insufficient documentation

## 2018-02-14 DIAGNOSIS — R109 Unspecified abdominal pain: Secondary | ICD-10-CM | POA: Diagnosis not present

## 2018-02-14 DIAGNOSIS — E876 Hypokalemia: Secondary | ICD-10-CM | POA: Diagnosis not present

## 2018-02-14 LAB — COMPREHENSIVE METABOLIC PANEL
ALBUMIN: 3.9 g/dL (ref 3.5–5.0)
ALK PHOS: 89 U/L (ref 38–126)
ALT: 18 U/L (ref 0–44)
ANION GAP: 8 (ref 5–15)
AST: 20 U/L (ref 15–41)
BILIRUBIN TOTAL: 0.5 mg/dL (ref 0.3–1.2)
BUN: 8 mg/dL (ref 8–23)
CALCIUM: 9.9 mg/dL (ref 8.9–10.3)
CO2: 25 mmol/L (ref 22–32)
Chloride: 107 mmol/L (ref 98–111)
Creatinine, Ser: 0.74 mg/dL (ref 0.44–1.00)
GFR calc Af Amer: >60 mL/min (ref >60)
GFR calc non Af Amer: >60 mL/min (ref >60)
GLUCOSE: 100 mg/dL — AB (ref 70–99)
Potassium: 3.1 mmol/L — ABNORMAL LOW (ref 3.5–5.1)
SODIUM: 140 mmol/L (ref 135–145)
Total Protein: 7.4 g/dL (ref 6.5–8.1)

## 2018-02-14 LAB — CBC WITH DIFFERENTIAL/PLATELET
ABS IMMATURE GRANULOCYTES: 0 10*3/uL (ref 0.0–0.1)
BASOS PCT: 1 %
Basophils Absolute: 0 10*3/uL (ref 0.0–0.1)
EOS PCT: 8 %
Eosinophils Absolute: 0.3 10*3/uL (ref 0.0–0.7)
HCT: 41.8 % (ref 36.0–46.0)
HEMOGLOBIN: 13.4 g/dL (ref 12.0–15.0)
Immature Granulocytes: 0 %
Lymphocytes Relative: 41 %
Lymphs Abs: 1.2 10*3/uL (ref 0.7–4.0)
MCH: 26 pg (ref 26.0–34.0)
MCHC: 32.1 g/dL (ref 30.0–36.0)
MCV: 81.2 fL (ref 78.0–100.0)
MONO ABS: 0.3 10*3/uL (ref 0.1–1.0)
MONOS PCT: 10 %
NEUTROS ABS: 1.2 10*3/uL — AB (ref 1.7–7.7)
Neutrophils Relative %: 40 %
PLATELETS: 254 10*3/uL (ref 150–400)
RBC: 5.15 MIL/uL — ABNORMAL HIGH (ref 3.87–5.11)
RDW: 15.5 % (ref 11.5–15.5)
WBC: 3 10*3/uL — ABNORMAL LOW (ref 4.0–10.5)

## 2018-02-14 LAB — URINALYSIS, ROUTINE W REFLEX MICROSCOPIC
BACTERIA UA: NONE SEEN
BILIRUBIN URINE: NEGATIVE
Glucose, UA: NEGATIVE mg/dL
Ketones, ur: NEGATIVE mg/dL
Leukocytes, UA: NEGATIVE
Nitrite: NEGATIVE
Protein, ur: 30 mg/dL — AB
SPECIFIC GRAVITY, URINE: 1.017 (ref 1.005–1.030)
pH: 5 (ref 5.0–8.0)

## 2018-02-14 LAB — I-STAT TROPONIN, ED
TROPONIN I, POC: 0 ng/mL (ref 0.00–0.08)
Troponin i, poc: 0 ng/mL (ref 0.00–0.08)

## 2018-02-14 LAB — LIPASE, BLOOD: Lipase: 25 U/L (ref 11–51)

## 2018-02-14 MED ORDER — GI COCKTAIL ~~LOC~~
30.0000 mL | Freq: Once | ORAL | Status: AC
  Administered 2018-02-14: 30 mL via ORAL
  Filled 2018-02-14: qty 30

## 2018-02-14 MED ORDER — POTASSIUM CHLORIDE CRYS ER 20 MEQ PO TBCR
40.0000 meq | EXTENDED_RELEASE_TABLET | Freq: Once | ORAL | Status: AC
  Administered 2018-02-14: 40 meq via ORAL
  Filled 2018-02-14: qty 2

## 2018-02-14 MED ORDER — METHYLPREDNISOLONE SODIUM SUCC 125 MG IJ SOLR
80.0000 mg | Freq: Once | INTRAMUSCULAR | Status: AC
  Administered 2018-02-14: 80 mg via INTRAVENOUS
  Filled 2018-02-14: qty 2

## 2018-02-14 MED ORDER — PREDNISONE 10 MG (21) PO TBPK: ORAL_TABLET | ORAL | 0 refills | Status: DC

## 2018-02-14 MED ORDER — IPRATROPIUM-ALBUTEROL 0.5-2.5 (3) MG/3ML IN SOLN
3.0000 mL | Freq: Once | RESPIRATORY_TRACT | Status: AC
  Administered 2018-02-14: 3 mL via RESPIRATORY_TRACT
  Filled 2018-02-14: qty 3

## 2018-02-14 MED ORDER — DICYCLOMINE HCL 20 MG PO TABS: 20.0000 mg | ORAL_TABLET | Freq: Two times a day (BID) | ORAL | 0 refills | Status: DC

## 2018-02-14 MED ORDER — IOHEXOL 300 MG/ML  SOLN
100.0000 mL | Freq: Once | INTRAMUSCULAR | Status: AC | PRN
  Administered 2018-02-14: 100 mL via INTRAVENOUS

## 2018-02-14 MED ORDER — ACETAMINOPHEN 325 MG PO TABS
650.0000 mg | ORAL_TABLET | Freq: Once | ORAL | Status: AC
  Administered 2018-02-14: 650 mg via ORAL
  Filled 2018-02-14: qty 2

## 2018-02-14 NOTE — ED Provider Notes (Signed)
[facility] EMERGENCY DEPARTMENT Provider Note   CSN: [id number] Arrival date & time: 02/14/18  9924     History   Chief Complaint Chief Complaint  Patient presents with  . Chest Pain    HPI [name] is a 67 y.o. female.  HPI   [name] is a 67 y.o. female, with a history of A. fib, asthma, GERD, HTN, presenting to the ED with chest pain shortness of breath beginning around 3 PM yesterday.  Patient states discomfort began while at rest.  Pain feels like a pressure, constant, lower central chest, 9/10, nonradiating.  She has not experienced this before. Patient states she was placed on Linzess for difficulty having bowel movements on Friday, June 21.  States she thinks this medication is causing her chest pain. Patient also complains of suprapubic abdominal pain for the last 3 days.  Pain feels like a cramping, 6/10, nonradiating.  She has had 3 soft bowel movements a day.  Discomfort improves somewhat following bowel movement.  Denies cough, fever/chills, N/V, hematochezia/melena, dysuria, hematuria, diaphoresis, dizziness, or any other complaints. States she had an ablation in February 2019 that corrected her A. fib.   Past Medical History:  Diagnosis Date  . A-fib ([place])   . Allergy    seasonal  . Asthma   . Chronic anticoagulation   . GERD (gastroesophageal reflux disease)   . Hypertension   . PAF (paroxysmal atrial fibrillation) ([place]) 11/26/2015   April/May 2017: Multiple ED visits for A. Fib.  02/12/16: Improved on diltiazem IV. Cardiology consulted in the ED and discharge patient with diltiazem XR 120 mg once daily with 60 mg by mouth every 6 hours as needed for symptomatic palpitations, flecainide 50 mg twice daily. Referred to the atrial fibrillation clinic.  02/17/16: Seen in the atrial fibrillation clinic by NP Roderic Palau. Normal sin    Patient Active Problem List   Diagnosis Date Noted  . Generalized abdominal pain 02/10/2018  .  Melena 01/30/2018  . Periumbilical abdominal pain 01/20/2018  . Hyperpigmented skin lesion 09/09/2017  . Allergic contact dermatitis 04/27/2017  . Osteoporosis without current pathological fracture 04/15/2017  . Constipation 10/19/2016  . Normocytic anemia 10/19/2016  . Hyperlipidemia 03/18/2016  . Special screening for malignant neoplasms, colon 03/17/2016  . Eczema 03/17/2016  . Cervical cancer screening 03/17/2016  . Assistance with transportation 03/17/2016  . Hypertension   . GERD (gastroesophageal reflux disease)   . PAF (paroxysmal atrial fibrillation) ([place]) 11/26/2015  . Chronic anticoagulation 11/26/2015  . Folliculitis 26/83/4196  . Marijuana abuse 06/11/2015  . Asthma, mild intermittent     Past Surgical History:  Procedure Laterality Date  . ATRIAL FIBRILLATION ABLATION N/A 09/30/2017   Procedure: ATRIAL FIBRILLATION ABLATION;  Surgeon: Thompson Grayer, MD;  Location: [facility];  Service: Cardiovascular;  Laterality: N/A;  . TONSILLECTOMY    . TUBAL LIGATION       OB History   None      Home Medications    Prior to Admission medications   Medication Sig Start Date End Date Taking? Authorizing Provider  benzonatate (TESSALON PERLES) 100 MG capsule Take 1 capsule (100 mg total) by mouth 3 (three) times daily as needed for cough. 11/14/17 11/14/18 Yes [name], MD  dicyclomine (BENTYL) 20 MG tablet Take 1 tablet (20 mg total) by mouth 2 (two) times daily. 01/25/18  Yes Carlisle Cater, PA-C  diltiazem (CARDIZEM) 60 MG tablet TAKE 1 TABLET BY MOUTH EVERY 6 HOURS AS  NEEDED FOR RAPID AFIB HEART RATE OVER 100 Patient taking differently: TAKE 1 TABLET BY MOUTH EVERY IN THE MORNING 06/10/17  Yes Sherran Needs, NP  diltiazem (CARTIA XT) 120 MG 24 hr capsule TAKE 1 CAPSULE(120 MG) [place] EVENING Patient taking differently: Take 120 mg by mouth as needed.  01/02/18  Yes Sherran Needs, NP  diphenhydrAMINE (BENADRYL) 25 MG tablet Take 1 tablet (25 mg  total) by mouth every 6 (six) hours. Take regularly for 3 days then as needed if rash comes back. 01/01/18  Yes Upstill, Shari, PA-C  fluticasone (FLOVENT HFA) 220 MCG/ACT inhaler Inhale 1 puff into the lungs 2 (two) times daily. 01/20/18  Yes [name], MD  HYDROcodone-acetaminophen (NORCO/VICODIN) 5-325 MG tablet Take 1 tablet by mouth every 6 (six) hours as needed for severe pain. 02/04/18  Yes Blanchie Dessert, MD  levalbuterol (XOPENEX HFA) 45 MCG/ACT inhaler Inhale 1 puff into the lungs every 6 (six) hours as needed for shortness of breath. 11/21/17  Yes Carmin Muskrat, MD  linaclotide [facility]) 145 MCG CAPS capsule Take 1 capsule (145 mcg total) by mouth daily before breakfast. 02/10/18  Yes Zehr, Laban Emperor, PA-C  losartan (COZAAR) 50 MG tablet Take 1 tablet (50 mg total) by mouth daily. 10/21/17  Yes [name], MD  magnesium hydroxide (MILK OF MAGNESIA) 400 MG/5ML suspension Take 15-30 mLs by mouth at bedtime as needed for mild constipation. 11/01/17  Yes Harris, Abigail, PA-C  montelukast (SINGULAIR) 10 MG tablet Take 1 tablet (10 mg total) by mouth at bedtime. 09/09/17  Yes [name], MD  Multiple Vitamin (MULTI-VITAMIN PO) Take 1 tablet by mouth daily.   Yes [provider]  potassium chloride SA (K-DUR,KLOR-CON) 10 MEQ tablet Take 2 tablets (20 mEq total) by mouth daily. Patient taking differently: Take 10 mEq by mouth 2 (two) times daily.  11/12/16  Yes Sherran Needs, NP  rivaroxaban (XARELTO) 20 MG TABS tablet Take 1 tablet (20 mg total) by mouth daily with supper. 10/25/17  Yes Sherran Needs, NP  simethicone (GAS-X) 80 MG chewable tablet Chew 1 tablet (80 mg total) by mouth every 6 (six) hours as needed for flatulence. 11/01/17  Yes Harris, Abigail, PA-C  sucralfate (CARAFATE) 1 GM/10ML suspension Take 10 mLs (1 g total) by mouth 4 (four) times daily. 01/30/18 01/30/19 Yes Lacroce, Hulen Shouts, MD  tiotropium (SPIRIVA) 18 MCG inhalation capsule Place 1 capsule (18 mcg total)  into inhaler and inhale daily. 10/21/17  Yes [name], MD  dicyclomine (BENTYL) 20 MG tablet Take 1 tablet (20 mg total) by mouth 2 (two) times daily. 02/14/18   Joy, Shawn C, PA-C  esomeprazole (NEXIUM) 20 MG capsule TAKE 1 CAPSULE(20 MG) BY MOUTH DAILY Patient not taking: Reported on 02/14/2018 01/23/18   [name], MD  polyethylene glycol powder (GLYCOLAX/MIRALAX) powder Mix 17g of Miralax in 8 ounces of water every other day Patient not taking: Reported on 02/14/2018 07/20/17   [name], MD  predniSONE (STERAPRED UNI-PAK 21 TAB) 10 MG (21) TBPK tablet Take 6 tabs (60mg ) on day 1, 5 tabs (50mg ) on day 2, 4 tabs (40mg ) on day 3, 3 tabs (30mg ) on day 4, 2 tabs (20mg ) on day 5, and 1 tab (10mg ) on day 6. 02/14/18   Joy, Helane Gunther, PA-C    Family History Family History  Problem Relation Age of Onset  . Heart attack Mother        [name] age  . Breast cancer Sister  Late 32s; now s/p mastectomy   . Lung cancer Brother        x 2  . Cancer Sister   . Cancer Sister   . Cancer Sister   . Colon cancer Neg Hx   . Colon polyps Neg Hx   . Esophageal cancer Neg Hx   . Rectal cancer Neg Hx   . Stomach cancer Neg Hx     Social History Social History   Tobacco Use  . Smoking status: Former Smoker    Last attempt to quit: 10/24/2015    Years since quitting: 2.3  . Smokeless tobacco: Never Used  Substance Use Topics  . Alcohol use: No  . Drug use: No    Types: Marijuana    Comment: Smoked marijuana in the past.     Allergies   Albuterol; Celecoxib; and Protonix [pantoprazole sodium]   Review of Systems Review of Systems  Constitutional: Negative for chills, diaphoresis and fever.  Respiratory: Positive for shortness of breath. Negative for cough.   Cardiovascular: Positive for chest pain.  Gastrointestinal: Positive for abdominal pain and diarrhea. Negative for blood in stool, nausea and vomiting.  Genitourinary: Negative for dysuria, flank pain and hematuria.    Neurological: Negative for dizziness.  All other systems reviewed and are negative.    Physical Exam Updated Vital Signs BP (!) 145/94   Pulse 82   Temp 98.3 F (36.8 C) (Oral)   Resp (!) 23   Ht 5\' 7"  (1.702 m)   Wt 71.2 kg (157 lb)   SpO2 96%   BMI 24.59 kg/m   Physical Exam  Constitutional: She appears well-developed and well-nourished. No distress.  HENT:  Head: Normocephalic and atraumatic.  Eyes: Conjunctivae are normal.  Neck: Neck supple.  Cardiovascular: Normal rate, regular rhythm, normal heart sounds and intact distal pulses.  Pulmonary/Chest: Effort normal and breath sounds normal. No respiratory distress.  No increased work of breathing.  Speaks in full sentences without difficulty.  Abdominal: Soft. There is tenderness in the periumbilical area. There is no guarding.  Musculoskeletal: She exhibits no edema.  Lymphadenopathy:    She has no cervical adenopathy.  Neurological: She is alert.  Skin: Skin is warm and dry. She is not diaphoretic.  Psychiatric: She has a normal mood and affect. Her behavior is normal.  Nursing note and vitals reviewed.    ED Treatments / Results  Labs (all labs ordered are listed, but only abnormal results are displayed) Labs Reviewed  COMPREHENSIVE METABOLIC PANEL - Abnormal; Notable for the following components:      Result Value   Potassium 3.1 (*)    Glucose, Bld 100 (*)    All other components within normal limits  CBC WITH DIFFERENTIAL/PLATELET - Abnormal; Notable for the following components:   WBC 3.0 (*)    RBC 5.15 (*)    Neutro Abs 1.2 (*)    All other components within normal limits  URINALYSIS, ROUTINE W REFLEX MICROSCOPIC - Abnormal; Notable for the following components:   Hgb urine dipstick MODERATE (*)    Protein, ur 30 (*)    All other components within normal limits  URINE CULTURE  LIPASE, BLOOD  I-STAT TROPONIN, ED  I-STAT TROPONIN, ED    EKG EKG Interpretation  Date/Time:  Tuesday February 14 2018 10:04:38 EDT Ventricular Rate:  85 PR Interval:    QRS Duration: 93 QT Interval:  410 QTC Calculation: 488 R Axis:   56 Text Interpretation:  Sinus rhythm Ventricular premature  complex Borderline prolonged QT interval When comapred to prior,new PVC. \ No STEMI Confirmed by Antony Blackbird [phone]) on 02/14/2018 4:03:17 PM   Radiology Dg Chest 2 View  Result Date: 02/14/2018 CLINICAL DATA:  Chest pain, shortness of breath EXAM: CHEST - 2 VIEW COMPARISON:  02/13/2018 FINDINGS: The heart size and mediastinal contours are within normal limits. Both lungs are clear. The visualized skeletal structures are unremarkable. IMPRESSION: No active cardiopulmonary disease. Electronically Signed   By: Kathreen Devoid   On: 02/14/2018 11:05   Dg Chest 2 View  Result Date: 02/13/2018 CLINICAL DATA:  Chest pain and shortness of breath for 2 days. EXAM: CHEST - 2 VIEW COMPARISON:  01/25/2018. FINDINGS: The heart size and mediastinal contours are within normal limits. Both lungs are clear. The visualized skeletal structures are unremarkable. IMPRESSION: No active cardiopulmonary disease.  Stable exam from priors. Electronically Signed   By: Staci Righter M.D.   On: 02/13/2018 17:19   Ct Abdomen Pelvis W Contrast  Result Date: 02/14/2018 CLINICAL DATA:  Epigastric and chest pain. Shortness of breath. Two days duration. EXAM: CT ABDOMEN AND PELVIS WITH CONTRAST TECHNIQUE: Multidetector CT imaging of the abdomen and pelvis was performed using the standard protocol following bolus administration of intravenous contrast. CONTRAST:  124mL OMNIPAQUE IOHEXOL 300 MG/ML  SOLN COMPARISON:  01/25/2018.  01/11/2018. FINDINGS: Lower chest: Negative.  Small hiatal hernia. Hepatobiliary: Multiple benign appearing scattered liver cysts. No acute attic parenchymal finding. No calcified gallstones. Pancreas: Normal Spleen: Normal Adrenals/Urinary Tract: Adrenal glands are normal. Kidneys are normal. No cyst, mass, stone or  hydronephrosis. No stone in the bladder. Stomach/Bowel: No sign of bowel obstruction or inflammatory disease. Vascular/Lymphatic: Aortic atherosclerosis. The aorta is tortuous. No aneurysm. IVC is normal. No retroperitoneal adenopathy. Reproductive: Uterus and adnexal regions appear normal for age. Other: No free fluid or air. Musculoskeletal: Chronic lumbar degenerative changes. IMPRESSION: No acute finding by CT.  No cause of acute pain identified. Aortic Atherosclerosis (ICD10-I70.0). Electronically Signed   By: Nelson Chimes M.D.   On: 02/14/2018 13:06    Procedures Procedures (including critical care time)  Medications Ordered in ED Medications  gi cocktail (Maalox,Lidocaine,Donnatal) (30 mLs Oral Given 02/14/18 1119)  potassium chloride SA (K-DUR,KLOR-CON) CR tablet 40 mEq (40 mEq Oral Given 02/14/18 1308)  acetaminophen (TYLENOL) tablet 650 mg (650 mg Oral Given 02/14/18 1308)  iohexol (OMNIPAQUE) 300 MG/ML solution 100 mL (100 mLs Intravenous Contrast Given 02/14/18 1246)  ipratropium-albuterol (DUONEB) 0.5-2.5 (3) MG/3ML nebulizer solution 3 mL (3 mLs Nebulization Given 02/14/18 1529)  methylPREDNISolone sodium succinate (SOLU-MEDROL) 125 mg/2 mL injection 80 mg (80 mg Intravenous Given 02/14/18 1530)     Initial Impression / Assessment and Plan / ED Course  I have reviewed the triage vital signs and the nursing notes.  Pertinent labs & imaging results that were available during my care of the patient were reviewed by me and considered in my medical decision making (see chart for details).  Clinical Course as of Feb 15 1607  Tue Feb 14, 2018  1200 Patient has previous values of hypokalemia in this range.  Potassium(!): 3.1 [SJ]  1205 Microscopic hematuria has been found in the patient's urine multiple times previously.  Hgb urine dipstickMarland Kitchen): MODERATE [SJ]  1210 Patient was notified of this abnormality.  She will follow-up with her PCP on this matter.  WBC(!): 3.0 [SJ]  1241 Patient  states her pain has improved with the GI cocktail.   [SJ]  1500 Patient communicates with Dr. Sherry Ruffing she  has more shortness of breath and needs a breathing treatment. Dr. Sherry Ruffing states he now hears wheezing. Patient states she has used albuterol since her ablation without difficulty.   [SJ]  1594 Patient states her shortness of breath has resolved.  She is now requesting "something for sinus congestion."   [SJ]    Clinical Course User Index [SJ] Joy, Shawn C, PA-C    Patient first complains of lower chest pain and shortness of breath, but also complains of abdominal pain and diarrhea.  Chart review reveals these seem to be common complaints for this patient.  She has been evaluated in the ED as well as with GI for abdominal pain.  The focus of her complaints shifted throughout her ED course. Patient is nontoxic appearing, afebrile, not tachycardic, not tachypneic, not hypotensive, maintains excellent SPO2 on room air, and is in no apparent distress.  Overall, labs were reassuring.  Abnormalities addressed above.  Patient complaint free just prior to discharge. Patient will follow-up with her PCP and GI. The patient was given instructions for home care as well as return precautions. Patient voices understanding of these instructions, accepts the plan, and is comfortable with discharge.   Findings and plan of care discussed with Antony Blackbird, MD. Dr. Sherry Ruffing personally evaluated and examined this patient.  Vitals:   02/14/18 1002 02/14/18 1005 02/14/18 1015 02/14/18 1017  BP:  (!) 155/92 (!) 145/94   Pulse:  85 82   Resp:  17 (!) 23   Temp: 98.3 F (36.8 C) 98.3 F (36.8 C)    TempSrc: Oral Oral    SpO2:  100% 96%   Weight: 71.2 kg (157 lb)   71.2 kg (157 lb)  Height: 5\' 7"  (1.702 m)   5\' 7"  (1.702 m)     Final Clinical Impressions(s) / ED Diagnoses   Final diagnoses:  Atypical chest pain  Sinus congestion  Abdominal cramping  Diarrhea, unspecified type  Microscopic  hematuria  Hypokalemia    ED Discharge Orders        Ordered    predniSONE (STERAPRED UNI-PAK 21 TAB) 10 MG (21) TBPK tablet     02/14/18 1558    dicyclomine (BENTYL) 20 MG tablet  2 times daily     02/14/18 1558       Lorayne Bender, PA-C 02/14/18 1609    Tegeler, Gwenyth Allegra, MD 02/14/18 2041

## 2018-02-14 NOTE — Discharge Instructions (Addendum)
° °  Hand washing: Wash your hands throughout the day, but especially before and after touching the face, using the restroom, sneezing, coughing, or touching surfaces that have been coughed or sneezed upon. Hydration: Symptoms will be intensified and complicated by dehydration. Dehydration can also extend the duration of symptoms. Drink plenty of fluids and get plenty of rest. You should be drinking at least half a liter of water an hour to stay hydrated. Electrolyte drinks (ex. Gatorade, Powerade, Pedialyte) are also encouraged. You should be drinking enough fluids to make your urine light yellow, almost clear. If this is not the case, you are not drinking enough water. Please note that some of the treatments indicated below will not be effective if you are not adequately hydrated. Diet: Please concentrate on hydration, however, you may introduce food slowly.  Start with a clear liquid diet, progressed to a full liquid diet, and then bland solids as you are able. Pain or fever: Tylenol for pain or fever.  Tylenol: May take Tylenol, as needed, for pain. Your daily total maximum amount of tylenol from all sources should be limited to 4000mg /day for persons without liver problems, or 2000mg /day for those with liver problems. Bentyl: May use the Bentyl as needed for abdominal cramping. Prednisone: Take the prednisone, as directed, in its entirety. Zyrtec or Claritin: May add these medication daily to control underlying symptoms of congestion, sneezing, and other signs of allergies.  These medications are available over-the-counter. Flonase: Use this medication, as directed, for nasal and sinus congestion.  This medication is available over-the-counter. Congestion: Plain Mucinex may help relieve congestion. Saline sinus rinses and saline nasal sprays may also help relieve congestion. Sore throat: Warm liquids or Chloraseptic spray may help soothe a sore throat. Gargle twice a day with a salt water solution  made from a half teaspoon of salt in a cup of warm water.  Follow up: Follow up with a primary care provider, as needed, for any future management of this issue.  Follow-up with your GI specialist regarding the use of Linzess.  There was some microscopic blood in the urine, please follow-up with your primary care provider or urology on this matter.  You have some low potassium.  Please follow-up with your primary care provider on this matter.  Your white blood cell count was lower than normal.  Please follow-up with your primary care provider on this matter.

## 2018-02-14 NOTE — ED Notes (Signed)
Pt ambulated to restroom. Tolerated fair with stand by assist. Pt was slightly unsteady and short of breath. Pt returned to bed and is on monitors.

## 2018-02-14 NOTE — ED Triage Notes (Signed)
Patient coming from home c/o non radiating central chest pain and SOB x2 days, as well as a headache. Denies any nausea. Hx of Afib. Ablation surgery this past January.

## 2018-02-14 NOTE — Telephone Encounter (Signed)
The pt is currently in the ED for eval of chest pain and SOB

## 2018-02-14 NOTE — ED Notes (Signed)
ED Provider at bedside. 

## 2018-02-14 NOTE — Telephone Encounter (Signed)
Sounds good.  SOB and chest pain should not be from the [place].  I see that she is in the ED.

## 2018-02-15 ENCOUNTER — Telehealth: Payer: Self-pay | Admitting: Gastroenterology

## 2018-02-15 LAB — URINE CULTURE

## 2018-02-15 NOTE — Telephone Encounter (Signed)
Pt would like to speak with you regarding Linzess. She wants to know if Janett Billow would want to prescribe something different due to the side effects.

## 2018-02-15 NOTE — Telephone Encounter (Signed)
Left message on machine to call back  

## 2018-02-16 NOTE — Telephone Encounter (Signed)
Left message on machine to call back unable to reach pt left a message for he to return call if she has continued problems.  Will wait for further communication from the pt.

## 2018-02-16 NOTE — Telephone Encounter (Signed)
Another message left for the pt to return call

## 2018-02-16 NOTE — Telephone Encounter (Signed)
Left message on machine to call back  

## 2018-02-16 NOTE — Telephone Encounter (Signed)
Patient returned phone call. °

## 2018-02-20 ENCOUNTER — Encounter: Payer: Self-pay | Admitting: Internal Medicine

## 2018-03-08 ENCOUNTER — Other Ambulatory Visit (HOSPITAL_COMMUNITY): Payer: Self-pay | Admitting: Nurse Practitioner

## 2018-03-08 ENCOUNTER — Other Ambulatory Visit: Payer: Self-pay | Admitting: Internal Medicine

## 2018-03-08 NOTE — Telephone Encounter (Signed)
Refill Request   potassium chloride SA (K-DUR,KLOR-CON) 10 MEQ tablet

## 2018-03-08 NOTE — Telephone Encounter (Signed)
Rx for potassium already sent today. Hubbard Hartshorn, RN, BSN

## 2018-03-16 ENCOUNTER — Other Ambulatory Visit: Payer: Self-pay | Admitting: *Deleted

## 2018-03-16 NOTE — Patient Outreach (Addendum)
[facility]) Care Management  03/16/2018   [name] 04/19/1951 [id number]  RN Health Coach telephone call to patient.  [employer] compliance verified. Per patient she is in the green zone and taking medications as prescribed. Patient stated that her stomach pain is better. Per patient she is stying inside mostly out of the heat.  Patient has agreed to follow up outreach calls.  Current Medications:  Current Outpatient Medications  Medication Sig Dispense Refill  . benzonatate (TESSALON PERLES) 100 MG capsule Take 1 capsule (100 mg total) by mouth 3 (three) times daily as needed for cough. 30 capsule 1  . dicyclomine (BENTYL) 20 MG tablet Take 1 tablet (20 mg total) by mouth 2 (two) times daily. 20 tablet 0  . dicyclomine (BENTYL) 20 MG tablet Take 1 tablet (20 mg total) by mouth 2 (two) times daily. 20 tablet 0  . diltiazem (CARDIZEM) 60 MG tablet TAKE 1 TABLET BY MOUTH EVERY 6 HOURS AS NEEDED FOR RAPID AFIB HEART RATE OVER 100 (Patient taking differently: TAKE 1 TABLET BY MOUTH EVERY IN THE MORNING) 45 tablet 2  . diltiazem (CARTIA XT) 120 MG 24 hr capsule TAKE 1 CAPSULE(120 MG) BY MOUTH EVERY EVENING (Patient taking differently: Take 120 mg by mouth as needed. ) 90 capsule 3  . diphenhydrAMINE (BENADRYL) 25 MG tablet Take 1 tablet (25 mg total) by mouth every 6 (six) hours. Take regularly for 3 days then as needed if rash comes back. 20 tablet 0  . esomeprazole (NEXIUM) 20 MG capsule TAKE 1 CAPSULE(20 MG) BY MOUTH DAILY (Patient not taking: Reported on 02/14/2018) 90 capsule 1  . fluticasone (FLOVENT HFA) 220 MCG/ACT inhaler Inhale 1 puff into the lungs 2 (two) times daily. 1 Inhaler 12  . HYDROcodone-acetaminophen (NORCO/VICODIN) 5-325 MG tablet Take 1 tablet by mouth every 6 (six) hours as needed for severe pain. 10 tablet 0  . levalbuterol (XOPENEX HFA) 45 MCG/ACT inhaler Inhale 1 puff into the lungs every 6 (six) hours as needed for shortness of breath. 1 Inhaler 12  .  linaclotide (LINZESS) 145 MCG CAPS capsule Take 1 capsule (145 mcg total) by mouth daily before breakfast. 10 capsule 0  . losartan (COZAAR) 50 MG tablet Take 1 tablet (50 mg total) by mouth daily. 30 tablet 5  . magnesium hydroxide (MILK OF MAGNESIA) 400 MG/5ML suspension Take 15-30 mLs by mouth at bedtime as needed for mild constipation. 360 mL 0  . montelukast (SINGULAIR) 10 MG tablet Take 1 tablet (10 mg total) by mouth at bedtime. 90 tablet 1  . Multiple Vitamin (MULTI-VITAMIN PO) Take 1 tablet by mouth daily.    . polyethylene glycol powder (GLYCOLAX/MIRALAX) powder Mix 17g of Miralax in 8 ounces of water every other day (Patient not taking: Reported on 02/14/2018) 255 g 0  . potassium chloride (K-DUR,KLOR-CON) 10 MEQ tablet Take 1 tablet (10 mEq total) by mouth 2 (two) times daily. 60 tablet 6  . predniSONE (STERAPRED UNI-PAK 21 TAB) 10 MG (21) TBPK tablet Take 6 tabs (60mg ) on day 1, 5 tabs (50mg ) on day 2, 4 tabs (40mg ) on day 3, 3 tabs (30mg ) on day 4, 2 tabs (20mg ) on day 5, and 1 tab (10mg ) on day 6. 21 tablet 0  . rivaroxaban (XARELTO) 20 MG TABS tablet Take 1 tablet (20 mg total) by mouth daily with supper. 30 tablet 6  . simethicone (GAS-X) 80 MG chewable tablet Chew 1 tablet (80 mg total) by mouth every 6 (six) hours as needed  for flatulence. 30 tablet 0  . sucralfate (CARAFATE) 1 GM/10ML suspension Take 10 mLs (1 g total) by mouth 4 (four) times daily. 420 mL 1  . tiotropium (SPIRIVA) 18 MCG inhalation capsule Place 1 capsule (18 mcg total) into inhaler and inhale daily. 30 capsule 2   No current facility-administered medications for this visit.     Functional Status:  In your present state of health, do you have any difficulty performing the following activities: 01/30/2018 01/20/2018  Hearing? N N  Vision? N N  Difficulty concentrating or making decisions? N N  Walking or climbing stairs? N N  Comment - -  Dressing or bathing? N N  Doing errands, shopping? N N  Preparing Food  and eating ? - -  Using the Toilet? - -  In the past six months, have you accidently leaked urine? - -  Do you have problems with loss of bowel control? - -  Managing your Medications? - -  Managing your Finances? - -  Housekeeping or managing your Housekeeping? - -  Some recent data might be hidden    Fall/Depression Screening: Fall Risk  03/16/2018 02/10/2018 01/30/2018  Falls in the past year? No No No  Risk for fall due to : - - -   PHQ 2/9 Scores 03/16/2018 02/10/2018 01/30/2018 01/20/2018 12/09/2017 11/02/2017 09/09/2017  PHQ - 2 Score 0 0 0 0 0 0 0    Assessment:  Patient is in the green zone Patient will continue to  benefit from [place] telephonic outreach for education and support for COPD self management.  Plan:  RN discussed what zone patient in RN sent EMMI education on COPD WHAT YOU CAN DO RN sent EMMI education on COPD When to get help RN sent EMMI education on COPD Exacerbation of Copd RN sent EMMI education on COPD Your lungs RN sent clinical key on purse lip breathing RN sent clinical key Form on COPD action plan RN will follow up within the month of September Johny Shock BSN RN [place] [facility] [phone]

## 2018-03-22 ENCOUNTER — Encounter

## 2018-03-22 ENCOUNTER — Encounter: Payer: Self-pay | Admitting: Internal Medicine

## 2018-03-22 ENCOUNTER — Ambulatory Visit (INDEPENDENT_AMBULATORY_CARE_PROVIDER_SITE_OTHER): Payer: Medicare Other | Admitting: Internal Medicine

## 2018-03-22 VITALS — BP 140/86 | HR 71 | Ht 67.0 in | Wt 162.6 lb

## 2018-03-22 DIAGNOSIS — I480 Paroxysmal atrial fibrillation: Secondary | ICD-10-CM | POA: Diagnosis not present

## 2018-03-22 NOTE — Progress Notes (Signed)
PCP: Rogers Blocker, MD  [name] is a 67 y.o. female who presents today for routine electrophysiology followup.  Since his recent afib ablation, the patient reports doing very well.  she denies procedure related complications and is pleased with the results of the procedure.  Today, she denies symptoms of palpitations, chest pain, shortness of breath,  lower extremity edema, dizziness, presyncope, or syncope. She has cough/ congestion in June for which she presented to the ED.  Chest pain at that time was felt to be due to cough.  She reports that these symptoms have since resolved.  Denies any exertional cardiac symptoms.  The patient is otherwise without complaint today.   Past Medical History:  Diagnosis Date  . A-fib ([place])   . Allergy    seasonal  . Asthma   . Chronic anticoagulation   . GERD (gastroesophageal reflux disease)   . Hypertension   . PAF (paroxysmal atrial fibrillation) ([place]) 11/26/2015   April/May 2017: Multiple ED visits for A. Fib.  02/12/16: Improved on diltiazem IV. Cardiology consulted in the ED and discharge patient with diltiazem XR 120 mg once daily with 60 mg by mouth every 6 hours as needed for symptomatic palpitations, flecainide 50 mg twice daily. Referred to the atrial fibrillation clinic.  02/17/16: Seen in the atrial fibrillation clinic by NP Roderic Palau. Normal sin   Past Surgical History:  Procedure Laterality Date  . ATRIAL FIBRILLATION ABLATION N/A 09/30/2017   Procedure: ATRIAL FIBRILLATION ABLATION;  Surgeon: Thompson Grayer, MD;  Location: [facility];  Service: Cardiovascular;  Laterality: N/A;  . TONSILLECTOMY    . TUBAL LIGATION      ROS- all systems are personally reviewed and negatives except as per HPI above  Current Outpatient Medications  Medication Sig Dispense Refill  . benzonatate (TESSALON PERLES) 100 MG capsule Take 1 capsule (100 mg total) by mouth 3 (three) times daily as needed for cough. 30 capsule 1  . dicyclomine  (BENTYL) 20 MG tablet Take 1 tablet (20 mg total) by mouth 2 (two) times daily. 20 tablet 0  . diltiazem (CARDIZEM) 60 MG tablet TAKE 1 TABLET BY MOUTH EVERY 6 HOURS AS NEEDED FOR RAPID AFIB HEART RATE OVER 100 45 tablet 2  . diltiazem (CARTIA XT) 120 MG 24 hr capsule TAKE 1 CAPSULE(120 MG) BY MOUTH EVERY EVENING 90 capsule 3  . esomeprazole (NEXIUM) 20 MG capsule TAKE 1 CAPSULE(20 MG) BY MOUTH DAILY 90 capsule 1  . flecainide (TAMBOCOR) 100 MG tablet Take 1 tablet by mouth 2 (two) times daily.  6  . fluticasone (FLOVENT HFA) 220 MCG/ACT inhaler Inhale 1 puff into the lungs 2 (two) times daily. 1 Inhaler 12  . HYDROcodone-acetaminophen (NORCO/VICODIN) 5-325 MG tablet Take 1 tablet by mouth every 6 (six) hours as needed for severe pain. 10 tablet 0  . levalbuterol (XOPENEX HFA) 45 MCG/ACT inhaler Inhale 1 puff into the lungs every 6 (six) hours as needed for shortness of breath. 1 Inhaler 12  . linaclotide (LINZESS) 145 MCG CAPS capsule Take 1 capsule (145 mcg total) by mouth daily before breakfast. 10 capsule 0  . losartan (COZAAR) 50 MG tablet Take 1 tablet (50 mg total) by mouth daily. 30 tablet 5  . magnesium hydroxide (MILK OF MAGNESIA) 400 MG/5ML suspension Take 15-30 mLs by mouth at bedtime as needed for mild constipation. 360 mL 0  . montelukast (SINGULAIR) 10 MG tablet Take 1 tablet (10 mg total) by mouth at bedtime. 90 tablet  1  . polyethylene glycol powder (GLYCOLAX/MIRALAX) powder Mix 17g of Miralax in 8 ounces of water every other day 255 g 0  . potassium chloride (K-DUR,KLOR-CON) 10 MEQ tablet Take 1 tablet (10 mEq total) by mouth 2 (two) times daily. 60 tablet 6  . rivaroxaban (XARELTO) 20 MG TABS tablet Take 1 tablet (20 mg total) by mouth daily with supper. 30 tablet 6  . simethicone (GAS-X) 80 MG chewable tablet Chew 1 tablet (80 mg total) by mouth every 6 (six) hours as needed for flatulence. 30 tablet 0  . sucralfate (CARAFATE) 1 GM/10ML suspension Take 10 mLs (1 g total) by mouth  4 (four) times daily. 420 mL 1  . tiotropium (SPIRIVA) 18 MCG inhalation capsule Place 1 capsule (18 mcg total) into inhaler and inhale daily. 30 capsule 2  . Multiple Vitamin (MULTI-VITAMIN PO) Take 1 tablet by mouth daily.     No current facility-administered medications for this visit.     Physical Exam: Vitals:   03/22/18 1041  BP: 140/86  Pulse: 71  SpO2: 98%  Weight: 162 lb 9.6 oz (73.8 kg)  Height: 5\' 7"  (1.702 m)    GEN- The patient is well appearing, alert and oriented x 3 today.   Head- normocephalic, atraumatic Eyes-  Sclera clear, conjunctiva pink Ears- hearing intact Oropharynx- clear Lungs- Clear to ausculation bilaterally, normal work of breathing Heart- Regular rate and rhythm, no murmurs, rubs or gallops, PMI not laterally displaced GI- soft, NT, ND, + BS Extremities- no clubbing, cyanosis, or edema  EKG tracing ordered today is personally reviewed and shows sinus rhythm 71 bpm, PR 152 msec, QRS 88 msec, Qtc 445 msec  Assessment and Plan:  1. Paroxysmal atrial fibrillation Doing well s/p ablation chads2vasc score is 3.  Continue xarelto Stop flecainide Consider stopping diltiazem if no further afib when she presents to AF clinic in 3 months   Return to AF clinic in 3 months.  I will see in 6 months  Thompson Grayer MD, [facility] 03/22/2018 10:53 AM

## 2018-03-22 NOTE — Patient Instructions (Signed)
Medication Instructions:  Your physician has recommended you make the following change in your medication:  1. STOP Flecainide  * If you need a refill on your cardiac medications before your next appointment, please call your pharmacy.   Labwork: None ordered *We will only notify you of abnormal results, otherwise continue current treatment plan.  Testing/Procedures: None ordered  Follow-Up: Your physician recommends that you schedule a follow-up appointment in: 3 months with Roderic Palau in the AFib clinic.  Your physician wants you to follow-up in: 6 months with Dr. Rayann Heman.  You will receive a reminder letter in the mail two months in advance. If you don't receive a letter, please call our office to schedule the follow-up appointment.  *Please note that any paperwork needing to be filled out by the provider will need to be addressed at the front desk prior to seeing the provider. Please note that any FMLA, disability or other documents regarding health condition is subject to a $25.00 charge that must be received prior to completion of paperwork in the form of a money order or check.  Thank you for choosing [facility]!!

## 2018-04-17 ENCOUNTER — Ambulatory Visit: Payer: Self-pay | Admitting: *Deleted

## 2018-04-28 ENCOUNTER — Encounter: Payer: Self-pay | Admitting: Internal Medicine

## 2018-05-01 ENCOUNTER — Encounter: Payer: Self-pay | Admitting: Internal Medicine

## 2018-05-06 ENCOUNTER — Emergency Department (HOSPITAL_COMMUNITY)
Admission: EM | Admit: 2018-05-06 | Discharge: 2018-05-06 | Disposition: A | Discharge status: Home / Self Care | Payer: Medicare Other | Attending: Emergency Medicine | Admitting: Emergency Medicine

## 2018-05-06 ENCOUNTER — Encounter (HOSPITAL_COMMUNITY): Payer: Self-pay

## 2018-05-06 ENCOUNTER — Emergency Department (HOSPITAL_BASED_OUTPATIENT_CLINIC_OR_DEPARTMENT_OTHER): Payer: Medicare Other

## 2018-05-06 ENCOUNTER — Other Ambulatory Visit: Payer: Self-pay

## 2018-05-06 DIAGNOSIS — M79605 Pain in left leg: Secondary | ICD-10-CM | POA: Diagnosis not present

## 2018-05-06 DIAGNOSIS — Z87891 Personal history of nicotine dependence: Secondary | ICD-10-CM | POA: Diagnosis not present

## 2018-05-06 DIAGNOSIS — M79609 Pain in unspecified limb: Secondary | ICD-10-CM

## 2018-05-06 DIAGNOSIS — Z79899 Other long term (current) drug therapy: Secondary | ICD-10-CM | POA: Insufficient documentation

## 2018-05-06 DIAGNOSIS — L308 Other specified dermatitis: Secondary | ICD-10-CM | POA: Diagnosis not present

## 2018-05-06 DIAGNOSIS — M25562 Pain in left knee: Secondary | ICD-10-CM | POA: Insufficient documentation

## 2018-05-06 DIAGNOSIS — I10 Essential (primary) hypertension: Secondary | ICD-10-CM | POA: Insufficient documentation

## 2018-05-06 DIAGNOSIS — J45909 Unspecified asthma, uncomplicated: Secondary | ICD-10-CM | POA: Diagnosis not present

## 2018-05-06 DIAGNOSIS — M7989 Other specified soft tissue disorders: Secondary | ICD-10-CM

## 2018-05-06 DIAGNOSIS — L309 Dermatitis, unspecified: Secondary | ICD-10-CM | POA: Diagnosis not present

## 2018-05-06 HISTORY — DX: Dermatitis, unspecified: L30.9

## 2018-05-06 MED ORDER — HYDROCODONE-ACETAMINOPHEN 5-325 MG PO TABS: 1.0000 | ORAL_TABLET | Freq: Four times a day (QID) | ORAL | 0 refills | Status: DC | PRN

## 2018-05-06 MED ORDER — TRIAMCINOLONE ACETONIDE 0.1 % EX CREA: 1.0000 "application " | TOPICAL_CREAM | Freq: Two times a day (BID) | CUTANEOUS | 0 refills | Status: DC

## 2018-05-06 NOTE — ED Notes (Signed)
Patient verbalized understanding of discharge instructions and denies any further needs or questions at this time. VS stable. Patient ambulatory with steady gait - assisted in wheelchair to [address] [place] by nurse tech per patient request to see her family.

## 2018-05-06 NOTE — ED Provider Notes (Signed)
[facility] EMERGENCY DEPARTMENT Provider Note   CSN: [id number] Arrival date & time: 05/06/18  0848     History   Chief Complaint Chief Complaint  Patient presents with  . Leg Pain    HPI [name] is a 67 y.o. female with a past medical history significant for hypertension, paroxysmal A. fib who presents for evaluation of knee pain.  Patient states she has had bilateral knee pain for the past 2 to 3 weeks.  Pain is worse with walking.  Admits to left lower extremity swelling.  States her pain is a 6/10.  Describes her pain as an aching feeling. No fever, chills, headache, lightheadedness, tingling, back pain, chest pain, shortness of breath, abdominal pain, redness to extremities, nausea, vomiting.  No erythema, warmth to bilateral knees.  No swelling to right knee.  No known injury.  Admits to a rash to right lower extremity.  Patient states she has been diagnosed with eczema in this area.  This area has been flaring off and on for the past 6 months.  Was given a cream for this area however does not have any left.  Area is extremely pruritic.  Patient states she scratches until the area bleeds.  HPI  Past Medical History:  Diagnosis Date  . A-fib ([place])   . Allergy    seasonal  . Asthma   . Chronic anticoagulation   . Eczema   . GERD (gastroesophageal reflux disease)   . Hypertension   . PAF (paroxysmal atrial fibrillation) ([place]) 11/26/2015   April/May 2017: Multiple ED visits for A. Fib.  02/12/16: Improved on diltiazem IV. Cardiology consulted in the ED and discharge patient with diltiazem XR 120 mg once daily with 60 mg by mouth every 6 hours as needed for symptomatic palpitations, flecainide 50 mg twice daily. Referred to the atrial fibrillation clinic.  02/17/16: Seen in the atrial fibrillation clinic by NP Roderic Palau. Normal sin    Patient Active Problem List   Diagnosis Date Noted  . Generalized abdominal pain 02/10/2018  . Melena 01/30/2018  .  Periumbilical abdominal pain 01/20/2018  . Hyperpigmented skin lesion 09/09/2017  . Allergic contact dermatitis 04/27/2017  . Osteoporosis without current pathological fracture 04/15/2017  . Constipation 10/19/2016  . Normocytic anemia 10/19/2016  . Hyperlipidemia 03/18/2016  . Special screening for malignant neoplasms, colon 03/17/2016  . Eczema 03/17/2016  . Cervical cancer screening 03/17/2016  . Assistance with transportation 03/17/2016  . Hypertension   . GERD (gastroesophageal reflux disease)   . PAF (paroxysmal atrial fibrillation) ([place]) 11/26/2015  . Chronic anticoagulation 11/26/2015  . Folliculitis 38/25/0539  . Marijuana abuse 06/11/2015  . Asthma, mild intermittent     Past Surgical History:  Procedure Laterality Date  . ATRIAL FIBRILLATION ABLATION N/A 09/30/2017   Procedure: ATRIAL FIBRILLATION ABLATION;  Surgeon: Thompson Grayer, MD;  Location: [facility];  Service: Cardiovascular;  Laterality: N/A;  . TONSILLECTOMY    . TUBAL LIGATION       OB History   None      Home Medications    Prior to Admission medications   Medication Sig Start Date End Date Taking? Authorizing Provider  benzonatate (TESSALON PERLES) 100 MG capsule Take 1 capsule (100 mg total) by mouth 3 (three) times daily as needed for cough. 11/14/17 11/14/18  [name], MD  dicyclomine (BENTYL) 20 MG tablet Take 1 tablet (20 mg total) by mouth 2 (two) times daily. 01/25/18   Carlisle Cater, PA-C  diltiazem (  CARDIZEM) 60 MG tablet TAKE 1 TABLET BY MOUTH EVERY 6 HOURS AS NEEDED FOR RAPID AFIB HEART RATE OVER 100 06/10/17   Sherran Needs, NP  diltiazem (CARTIA XT) 120 MG 24 hr capsule TAKE 1 CAPSULE(120 MG) BY MOUTH EVERY EVENING 01/02/18   Sherran Needs, NP  esomeprazole (NEXIUM) 20 MG capsule TAKE 1 CAPSULE(20 MG) BY MOUTH DAILY 01/23/18   [name], MD  fluticasone (FLOVENT HFA) 220 MCG/ACT inhaler Inhale 1 puff into the lungs 2 (two) times daily. 01/20/18   [name], MD    HYDROcodone-acetaminophen (NORCO/VICODIN) 5-325 MG tablet Take 1 tablet by mouth every 6 (six) hours as needed for up to 3 days. 05/06/18 05/09/18  Samuell Knoble A, PA-C  levalbuterol (XOPENEX HFA) 45 MCG/ACT inhaler Inhale 1 puff into the lungs every 6 (six) hours as needed for shortness of breath. 11/21/17   Carmin Muskrat, MD  linaclotide [facility]) 145 MCG CAPS capsule Take 1 capsule (145 mcg total) by mouth daily before breakfast. 02/10/18   Zehr, Laban Emperor, PA-C  losartan (COZAAR) 50 MG tablet Take 1 tablet (50 mg total) by mouth daily. 10/21/17   [name], MD  magnesium hydroxide (MILK OF MAGNESIA) 400 MG/5ML suspension Take 15-30 mLs by mouth at bedtime as needed for mild constipation. 11/01/17   Harris, Abigail, PA-C  montelukast (SINGULAIR) 10 MG tablet Take 1 tablet (10 mg total) by mouth at bedtime. 09/09/17   [name], MD  Multiple Vitamin (MULTI-VITAMIN PO) Take 1 tablet by mouth daily.    [provider]  polyethylene glycol powder (GLYCOLAX/MIRALAX) powder Mix 17g of Miralax in 8 ounces of water every other day 07/20/17   [name], MD  potassium chloride (K-DUR,KLOR-CON) 10 MEQ tablet Take 1 tablet (10 mEq total) by mouth 2 (two) times daily. 03/08/18   Sherran Needs, NP  rivaroxaban (XARELTO) 20 MG TABS tablet Take 1 tablet (20 mg total) by mouth daily with supper. 10/25/17   Sherran Needs, NP  simethicone (GAS-X) 80 MG chewable tablet Chew 1 tablet (80 mg total) by mouth every 6 (six) hours as needed for flatulence. 11/01/17   Harris, Vernie Shanks, PA-C  sucralfate (CARAFATE) 1 GM/10ML suspension Take 10 mLs (1 g total) by mouth 4 (four) times daily. 01/30/18 01/30/19  Melanee Spry, MD  tiotropium (SPIRIVA) 18 MCG inhalation capsule Place 1 capsule (18 mcg total) into inhaler and inhale daily. 10/21/17   [name], MD  triamcinolone cream (KENALOG) 0.1 % Apply 1 application topically 2 (two) times daily. 05/06/18   Juma Oxley A, PA-C    Family  History Family History  Problem Relation Age of Onset  . Heart attack Mother        [name] age  . Breast cancer Sister        Late 51s; now s/p mastectomy   . Lung cancer Brother        x 2  . Cancer Sister   . Cancer Sister   . Cancer Sister   . Colon cancer Neg Hx   . Colon polyps Neg Hx   . Esophageal cancer Neg Hx   . Rectal cancer Neg Hx   . Stomach cancer Neg Hx     Social History Social History   Tobacco Use  . Smoking status: Former Smoker    Last attempt to quit: 10/24/2015    Years since quitting: 2.5  . Smokeless tobacco: Never Used  Substance Use Topics  . Alcohol use: No  . Drug  use: No    Types: Marijuana    Comment: Smoked marijuana in the past.     Allergies   Albuterol; Celecoxib; and Protonix [pantoprazole sodium]   Review of Systems Review of Systems  Systems negative unless otherwise stated in the HPI Physical Exam Updated Vital Signs BP (!) 161/108 (BP Location: Left Arm)   Pulse 83   Temp 98.6 F (37 C) (Oral)   Resp 20   Ht 5\' 7"  (1.702 m)   Wt 73.5 kg   SpO2 98%   BMI 25.37 kg/m   Physical Exam  Constitutional: She appears well-developed and well-nourished.  Non-toxic appearance. She does not have a sickly appearance. She does not appear ill. No distress.  HENT:  Head: Atraumatic.  Eyes: Pupils are equal, round, and reactive to light.  Neck: Normal range of motion. Neck supple. No JVD present. No tracheal deviation present.  Cardiovascular: Normal rate, regular rhythm, normal heart sounds, intact distal pulses and normal pulses. Exam reveals no gallop and no friction rub.  No murmur heard. Pulmonary/Chest: Effort normal and breath sounds normal. No stridor. No respiratory distress. She has no wheezes. She has no rales. She exhibits no tenderness.  Abdominal: Soft. Bowel sounds are normal. She exhibits no distension.  Musculoskeletal: Normal range of motion. She exhibits no tenderness or deformity.  Crepitus to bilateral knees.   Full range of motion with flexion and extension.  5/5 strength to bilateral knees.  Right leg without edema, erythema, warmth.  Left knee with tenderness palpation the popliteal area.  Mild swelling to the left lower extremity.  No erythema.  No tenderness to palpation of bilateral calves.  Full range of motion of ankle with plantar flexion dorsiflexion.  Intact sensation to sharp and dull bilateral lower extremities.  No tenderness palpation midline back.  Neurological: She is alert. No sensory deficit.  Skin: Skin is warm and dry. She is not diaphoretic. No pallor.  8 cm x 8 cm rash to the right lower extremity.  Areas of excoriation over rash.  No edema, erythema, warmth to lower extremity.  Psychiatric: She has a normal mood and affect.  Nursing note and vitals reviewed.    ED Treatments / Results  Labs (all labs ordered are listed, but only abnormal results are displayed) Labs Reviewed - No data to display  EKG None  Radiology No results found.  Procedures Procedures (including critical care time)  Medications Ordered in ED Medications - No data to display   Initial Impression / Assessment and Plan / ED Course  I have reviewed the triage vital signs and the nursing notes all his past medical history.  Pertinent labs & imaging results that were available during my care of the patient were reviewed by me and considered in my medical decision making (see chart for details).  Presents for evaluation of bilateral knee pain.  Worse the last 3 weeks.  Pain worse when going upstairs.  No left lower extremity swelling x3 days.  Given her history and physical exam I feel her knee pain is most likely arthritis, however given new onset left lower extremity swelling will obtain ultrasound to r/o DVT.  Ultrasound negative for DVT.  Patient pain most likely arthritic in nature.  Will DC home with course of pain medication as she has a severe allergic reaction to NSAIDs and have her follow-up  with primary care for possible orthopedic referral for her knee pain.  Looked patient up in PMP aware database and she does not  have an active controlled substance prescription.  Also DC home with triamcinolone cream for her eczema flare.  High blood pressure while in the ED, patient states she has not taken her blood pressure medicine this morning.  Does not want [name] to administer blood pressure medicine. She is asymptomatic.  Discussed with patient that she needs to take her blood pressure medicine when she gets home.  Discussed With patient return precautions.  Patient voiced understanding.    Final Clinical Impressions(s) / ED Diagnoses   Final diagnoses:  Left leg pain  Other eczema    ED Discharge Orders         Ordered    HYDROcodone-acetaminophen (NORCO/VICODIN) 5-325 MG tablet  Every 6 hours PRN     05/06/18 1144    triamcinolone cream (KENALOG) 0.1 %  2 times daily     05/06/18 1150           Cruise Baumgardner A, PA-C 05/06/18 1230    Tegeler, Gwenyth Allegra, MD 05/06/18 [phone]

## 2018-05-06 NOTE — ED Triage Notes (Addendum)
Pt endorses left knee pain and left upper leg pain that is "bouncing from leg to leg" x 2 weeks. Pedal pulses present. Also has recurring eczema to right lower leg.

## 2018-05-06 NOTE — Discharge Instructions (Addendum)
You Were evaluated today for bilateral knee pain and eczema.  Your ultrasound of your left leg did not show any evidence of clot.  Your knee pain is most likely a result of osteoarthritis in your knees.  I will discharge you with an anti-inflammatory medicine you follow-up with your primary care provider for a possible orthopedic referral.  Please return to the ED with any new or worsening symptoms such as:  Contact a doctor if: The pain does not stop. The pain changes or gets worse. You have a fever along with knee pain. Your knee gives out or locks up. Your knee swells, and becomes worse. Get help right away if: Your knee feels warm. You cannot move your knee. You have very bad knee pain. You have chest pain. You have trouble breathing.

## 2018-05-06 NOTE — ED Notes (Signed)
Taking tylenol.  Knees aching and throbbing.

## 2018-05-06 NOTE — Progress Notes (Signed)
VASCULAR LAB PRELIMINARY  PRELIMINARY  PRELIMINARY  PRELIMINARY  Left lower extremity venous duplex completed.    Preliminary report:  There is no DVT, SVT, or Baker's cyst noted in the left lower extremity.  There is fluid noted at the anterior medial knee and calf, etiology unknown.  Gave report to [place], [place], RVT 05/06/2018, 10:36 AM

## 2018-05-09 ENCOUNTER — Other Ambulatory Visit: Payer: Self-pay | Admitting: Internal Medicine

## 2018-05-09 DIAGNOSIS — I10 Essential (primary) hypertension: Secondary | ICD-10-CM

## 2018-05-15 ENCOUNTER — Other Ambulatory Visit: Payer: Self-pay

## 2018-05-15 ENCOUNTER — Ambulatory Visit (INDEPENDENT_AMBULATORY_CARE_PROVIDER_SITE_OTHER): Payer: Medicare Other | Admitting: Internal Medicine

## 2018-05-15 ENCOUNTER — Encounter: Payer: Self-pay | Admitting: Internal Medicine

## 2018-05-15 VITALS — BP 159/96 | HR 79 | Temp 98.4°F | Ht 67.0 in | Wt 159.4 lb

## 2018-05-15 DIAGNOSIS — R21 Rash and other nonspecific skin eruption: Secondary | ICD-10-CM

## 2018-05-15 DIAGNOSIS — Z8679 Personal history of other diseases of the circulatory system: Secondary | ICD-10-CM

## 2018-05-15 DIAGNOSIS — Z9889 Other specified postprocedural states: Secondary | ICD-10-CM | POA: Diagnosis not present

## 2018-05-15 DIAGNOSIS — Z79899 Other long term (current) drug therapy: Secondary | ICD-10-CM

## 2018-05-15 DIAGNOSIS — Z7901 Long term (current) use of anticoagulants: Secondary | ICD-10-CM

## 2018-05-15 DIAGNOSIS — M25462 Effusion, left knee: Secondary | ICD-10-CM | POA: Diagnosis not present

## 2018-05-15 DIAGNOSIS — I480 Paroxysmal atrial fibrillation: Secondary | ICD-10-CM

## 2018-05-15 DIAGNOSIS — M25562 Pain in left knee: Secondary | ICD-10-CM | POA: Insufficient documentation

## 2018-05-15 DIAGNOSIS — I10 Essential (primary) hypertension: Secondary | ICD-10-CM | POA: Diagnosis not present

## 2018-05-15 LAB — SYNOVIAL CELL COUNT + DIFF, W/ CRYSTALS
Crystals, Fluid: NONE SEEN
Lymphocytes-Synovial Fld: 5 % (ref 0–20)
Monocyte-Macrophage-Synovial Fluid: 94 % — ABNORMAL HIGH (ref 50–90)
NEUTROPHIL, SYNOVIAL: 1 % (ref 0–25)
WBC, SYNOVIAL: 190 /mm3 (ref 0–200)

## 2018-05-15 MED ORDER — HYDROCODONE-ACETAMINOPHEN 5-325 MG PO TABS: 1.0000 | ORAL_TABLET | Freq: Four times a day (QID) | ORAL | 0 refills | Status: DC | PRN

## 2018-05-15 MED ORDER — LOSARTAN POTASSIUM 100 MG PO TABS: 100.0000 mg | ORAL_TABLET | Freq: Every day | ORAL | 3 refills | Status: DC

## 2018-05-15 NOTE — Assessment & Plan Note (Signed)
BP Readings from Last 3 Encounters:  05/15/18 (!) 159/96  05/06/18 (!) 165/101  03/22/18 140/86   Her blood pressure remained elevated. Blood pressure was elevated during last few clinic visits. Patient denies any headache, blurry vision, chest pain or shortness of breath.  -Increase losartan to 100 mg daily. -Continue diltiazem.

## 2018-05-15 NOTE — Assessment & Plan Note (Addendum)
Differential for left knee and ankle swelling and pain can be either due to inflammatory secondary to gout or pseudogout or due to osteoarthritis. Infectious cause can be a possibility but less likely due to lack of hyperthermia , erythema or any systemic signs of infection. Patient is on Xarelto-bleeding in to a joint can be a possibility but there is no history of injury.  Point-of-care ultrasound shows left knee effusion, and soft tissue edema at left ankle.  Left knee arthrocentesis was performed which drained initially clear synovial fluid, with minor bleeding during repositioning. Synovial fluid was sent for cell count, Gram stain, culture and crystal studies. -She was also provided with a steroid injection with lidocaine in left knee. -Is also given a prescription of Vicodin 5-325 for 20 tablets.  Addendum.  Her synovial fluid analysis shows WBCs of 190 with no crystals and no organisms.  More consistent with osteoarthritis.  Procedure note. Left knee arthrocentesis with steroid injection. Diagnosis: Left knee effusion  Indications: Pain and swelling of left knee.  Anesthesia: Lidocaine  Procedure Details   Point of care ultrasound was used to identify the joint effusion and plan needle trajectory and depth. Consent was obtained for the procedure. The joint was prepped with Betadine. A 22 gauge needle was inserted into the superior aspect of the joint from a medial approach to access the suprapatellar pouch. 7 ml of synovitis fluid was removed from the joint and sent to the lab for analysis. 2 ml 1% lidocaine and 1 ml of Triamcinolone was then injected into the joint through the same needle. The needle was removed and the area cleansed and dressed.  Complications:  None; patient tolerated the procedure well.

## 2018-05-15 NOTE — Progress Notes (Signed)
   CC: Pain and swelling of left knee and ankle.  HPI:  [name] is a 67 y.o. with past medical history as listed below came to the clinic with complaint of pain and swelling of left knee and ankle for the past 3 weeks. Patient was seen in the ED on May 06, 2018 for similar complaints, venous Doppler for DVT was negative but shows some left knee effusion, she was given some Vicodin for pain.  According to patient Vicodin did help, she just took 3 pills out of that prescription and lost her pocketbook with her medicine and at at [facility].  She was asking for a new prescription. According to patient she cannot tolerate any NSAID as they make her sick.  She continued to experience left leg pain along left knee and ankle and swelling.  Is also complaining of pain behind the left knee.  She denies any inciting event or injury.  She denies any fever or chills.  Initially pain started on both knees, then improved on right and continued to get worse on left knee.  She developed swelling of her left knee and ankle.  She never developed any swelling on her right side.  She denies any recent illness or rash, except a chronic eczematous rash on right lateral lower extremity.  She denies any similar symptoms in the past.  Please see assessment and plan for her chronic conditions.  Past Medical History:  Diagnosis Date  . A-fib ([place])   . Allergy    seasonal  . Asthma   . Chronic anticoagulation   . Eczema   . GERD (gastroesophageal reflux disease)   . Hypertension   . PAF (paroxysmal atrial fibrillation) ([place]) 11/26/2015   April/May 2017: Multiple ED visits for A. Fib.  02/12/16: Improved on diltiazem IV. Cardiology consulted in the ED and discharge patient with diltiazem XR 120 mg once daily with 60 mg by mouth every 6 hours as needed for symptomatic palpitations, flecainide 50 mg twice daily. Referred to the atrial fibrillation clinic.  02/17/16: Seen in the atrial fibrillation clinic by NP  Roderic Palau. Normal sin   Review of Systems: Negative except mentioned in HPI.  Physical Exam:  Vitals:   05/15/18 1313 05/15/18 1315  BP: (!) 159/120 (!) 159/96  Pulse: 81 79  Temp: 98.4 F (36.9 C)   TempSrc: Oral   SpO2: 99%   Weight: 159 lb 6.4 oz (72.3 kg)   Height: 5\' 7"  (1.702 m)    General: Vital signs reviewed.  Patient is well-developed and well-nourished, in no acute distress and cooperative with exam.  Head: Normocephalic and atraumatic. Cardiovascular: RRR, S1 normal, S2 normal, no murmurs, gallops, or rubs. Pulmonary/Chest: Clear to auscultation bilaterally, no wheezes, rales, or rhonchi. Abdominal: Soft, non-tender, non-distended, BS +, Musculoskeletal: Tenderness and edema of left knee, more prominent anteriorly just below her kneecap, no hyperthermia or erythema.  Tenderness and edema of left ankle without any erythema or hyperthermia.  Restricted range of motion due to pain at left knee and ankle. Skin: Warm, dry and intact. No rashes or erythema. Psychiatric: Normal mood and affect. speech and behavior is normal. Cognition and memory are normal.  POC [name].  Shows a small left knee effusion, no obvious effusion at left ankle, mostly soft tissue edema.  Assessment & Plan:   See Encounters Tab for problem based charting.  Patient seen with Dr. Angelia Mould.

## 2018-05-15 NOTE — Patient Instructions (Addendum)
Thank you for visiting clinic today. I am giving you Vicodin for pain, you can take it every 6 hourly if needed. I am also increasing the dose of losartan from 50 mg to 100 mg daily, you can finish up your existing tablets by taking 2 at a time, once you get your new prescription you will take 1 tablet as that will be of new strength. Please follow-up in 2 weeks for your blood pressure check.  Knee Arthrocentesis, Care After Refer to this sheet in the next few weeks. These instructions provide you with information about caring for yourself after your procedure. Your health care provider may also give you more specific instructions. Your treatment has been planned according to current medical practices, but problems sometimes occur. Call your health care provider if you have any problems or questions after your procedure. What can I expect after the procedure? After the procedure, it is common to have:  Bruising.  Swelling.  Soreness.  Follow these instructions at home: Bathing  Keep the bandage (dressing) dry until your health care provider says it can be removed. Ask your health care provider when you can start showering or taking a bath. Managing pain, stiffness, and swelling  If directed, apply ice to the puncture area: ? Put ice in a plastic bag. ? Place a towel between your skin and the bag. ? Leave the ice on for 20 minutes, 2-3 times per day.  Do not apply heat to your knee.  Raise the puncture area above the level of your heart while you are sitting or lying down. Activity  Avoid strenuous activities for as long as directed by your health care provider. Ask your health care provider when you can return to your normal activities. General instructions  Take medicines only as directed by your health care provider.  Do not take aspirin or other over-the-counter medicines unless your health care provider says you can.  Check your puncture site every day for signs of  infection. Watch for: ? Redness, swelling, or pain. ? Fluid, blood, or pus.  Follow your health care provider's instructions about dressing changes and removal. Contact a health care provider if:  Your knee pain, swelling, or stiffness returns or gets worse.  You have redness, swelling, or pain in your puncture area.  You have fluid, blood, or pus coming from your puncture site.  You have warmth in your puncture area.  You have a fever.  Your pain is not controlled with medicine. This information is not intended to replace advice given to you by your health care provider. Make sure you discuss any questions you have with your health care provider. Document Released: 08/30/2014 Document Revised: 01/15/2016 Document Reviewed: 06/19/2014 Elsevier Interactive Patient Education  2018 [employer].

## 2018-05-15 NOTE — Assessment & Plan Note (Signed)
She denies any more palpitations. No more A. fib since her ablation. Flecainide was discontinued by her cardiologist on July 31. Patient will continue with Xarelto.

## 2018-05-16 ENCOUNTER — Other Ambulatory Visit: Payer: Self-pay | Admitting: Internal Medicine

## 2018-05-16 MED ORDER — MONTELUKAST SODIUM 10 MG PO TABS: 10.0000 mg | ORAL_TABLET | Freq: Every day | ORAL | 1 refills | Status: DC

## 2018-05-16 NOTE — Telephone Encounter (Addendum)
Refill Request.  Patient is also requesting a RX for her Allergy.  montelukast (SINGULAIR) 10 MG tablet  [id number] - [place], [place]

## 2018-05-16 NOTE — Progress Notes (Signed)
Internal Medicine Clinic Attending  I saw and evaluated the patient.  I personally confirmed the key portions of the history and exam documented by Dr. Reesa Chew and I reviewed pertinent patient test results.  The assessment, diagnosis, and plan were formulated together and I agree with the documentation in the resident's note. I was present for the entire procedure, I agree with Dr Latina Craver POCUS findings.

## 2018-05-17 ENCOUNTER — Telehealth: Payer: Self-pay | Admitting: *Deleted

## 2018-05-17 NOTE — Telephone Encounter (Signed)
She can get cetirizine over-the-counter.

## 2018-05-18 ENCOUNTER — Other Ambulatory Visit: Payer: Self-pay | Admitting: *Deleted

## 2018-05-18 LAB — BODY FLUID CULTURE: Culture: NO GROWTH

## 2018-05-18 NOTE — Patient Outreach (Signed)
[place] [facility]) Care Management  05/18/2018  [name] Oct 09, 1950 [id number]   [place] attempted #[id number] up outreach call to patient.  Patient was unavailable. HIPPA compliance voicemail message left with return callback number.  Plan: RN will call patient again within 10 business days.  [place] Care Management [phone]

## 2018-05-18 NOTE — Telephone Encounter (Signed)
See 9/25 telephone encounter.

## 2018-05-22 ENCOUNTER — Other Ambulatory Visit: Payer: Self-pay | Admitting: Internal Medicine

## 2018-05-23 ENCOUNTER — Other Ambulatory Visit: Payer: Self-pay | Admitting: *Deleted

## 2018-05-23 NOTE — Telephone Encounter (Signed)
Attempted to relay info from PCP. No answer. Message left on VM requesting return call. Hubbard Hartshorn, RN, BSN

## 2018-05-23 NOTE — Patient Outreach (Signed)
[place] [facility]) Care Management  05/23/2018  [name] [birth year] [id number]   [place] attempted#2 follow up outreach call to patient.  Patient was unavailable. HIPPA compliance voicemail message left with return callback number.  Plan: RN will call patient again within 30 days.  [place] Care Management [phone]

## 2018-05-25 ENCOUNTER — Telehealth: Payer: Self-pay

## 2018-05-25 NOTE — Telephone Encounter (Signed)
fluticasone (FLOVENT HFA) 220 MCG/ACT inhaler, and allergy med to be filled. Please call pt back.

## 2018-05-25 NOTE — Telephone Encounter (Signed)
Pt stated she needs a refill on ProAir/Albuterol inhaler take 2 puffs every 4hrs -stated she had started back taking it. Also needs a rx for Cetirizine 10 mg - informed she can buy OTC but said she never had to buy it OTC, always had a rx. Also asked if Dr Reesa Chew would call her.

## 2018-05-26 ENCOUNTER — Other Ambulatory Visit: Payer: Self-pay | Admitting: Internal Medicine

## 2018-05-26 ENCOUNTER — Telehealth: Payer: Self-pay | Admitting: Internal Medicine

## 2018-05-26 DIAGNOSIS — J4520 Mild intermittent asthma, uncomplicated: Secondary | ICD-10-CM

## 2018-05-26 IMAGING — DX DG CHEST 2V
2 series · 2 of 2 positions shown · non-contrast
Comparison: 09/30/2016.

CLINICAL DATA: Breathing difficulty. Atrial fibrillation.
Hypertension.

EXAM:
CHEST  2 VIEW

[w chest pa]
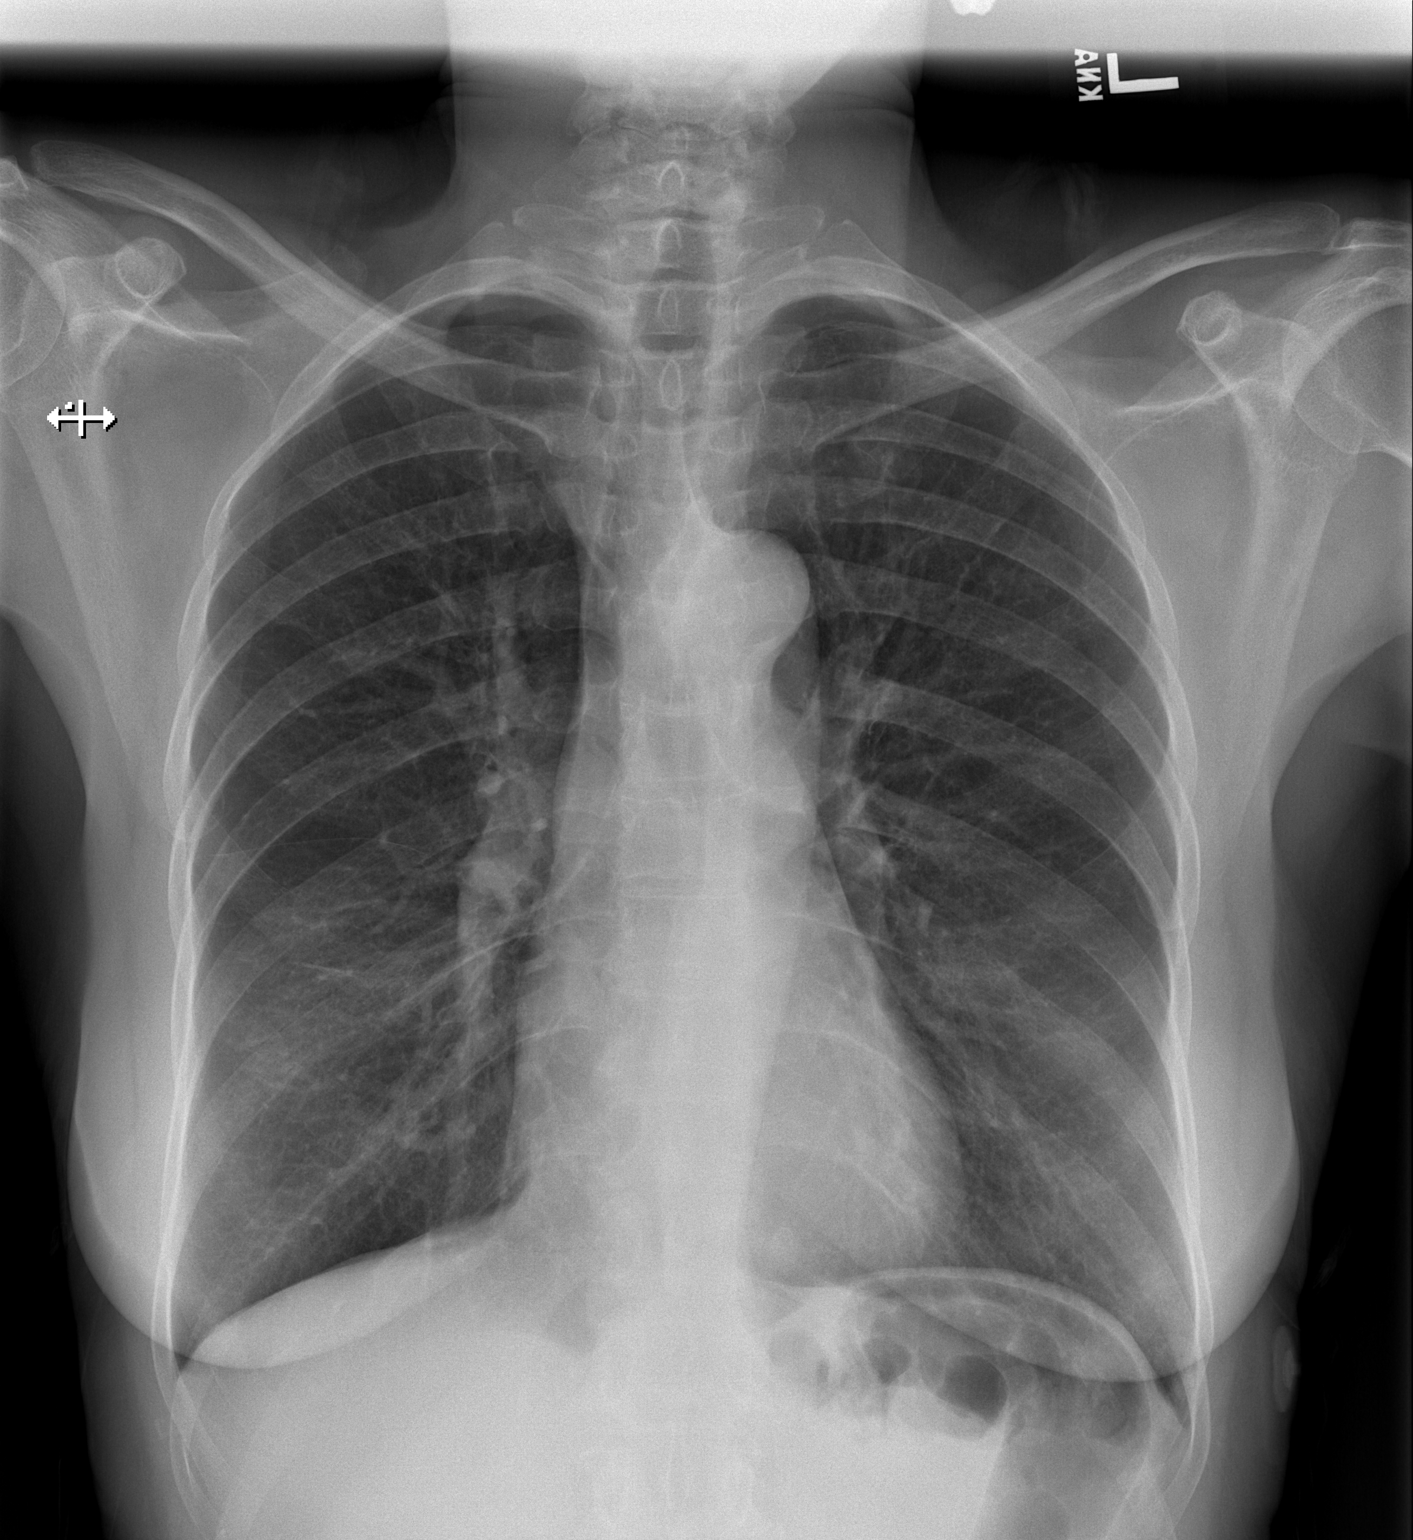

[w chest lat]
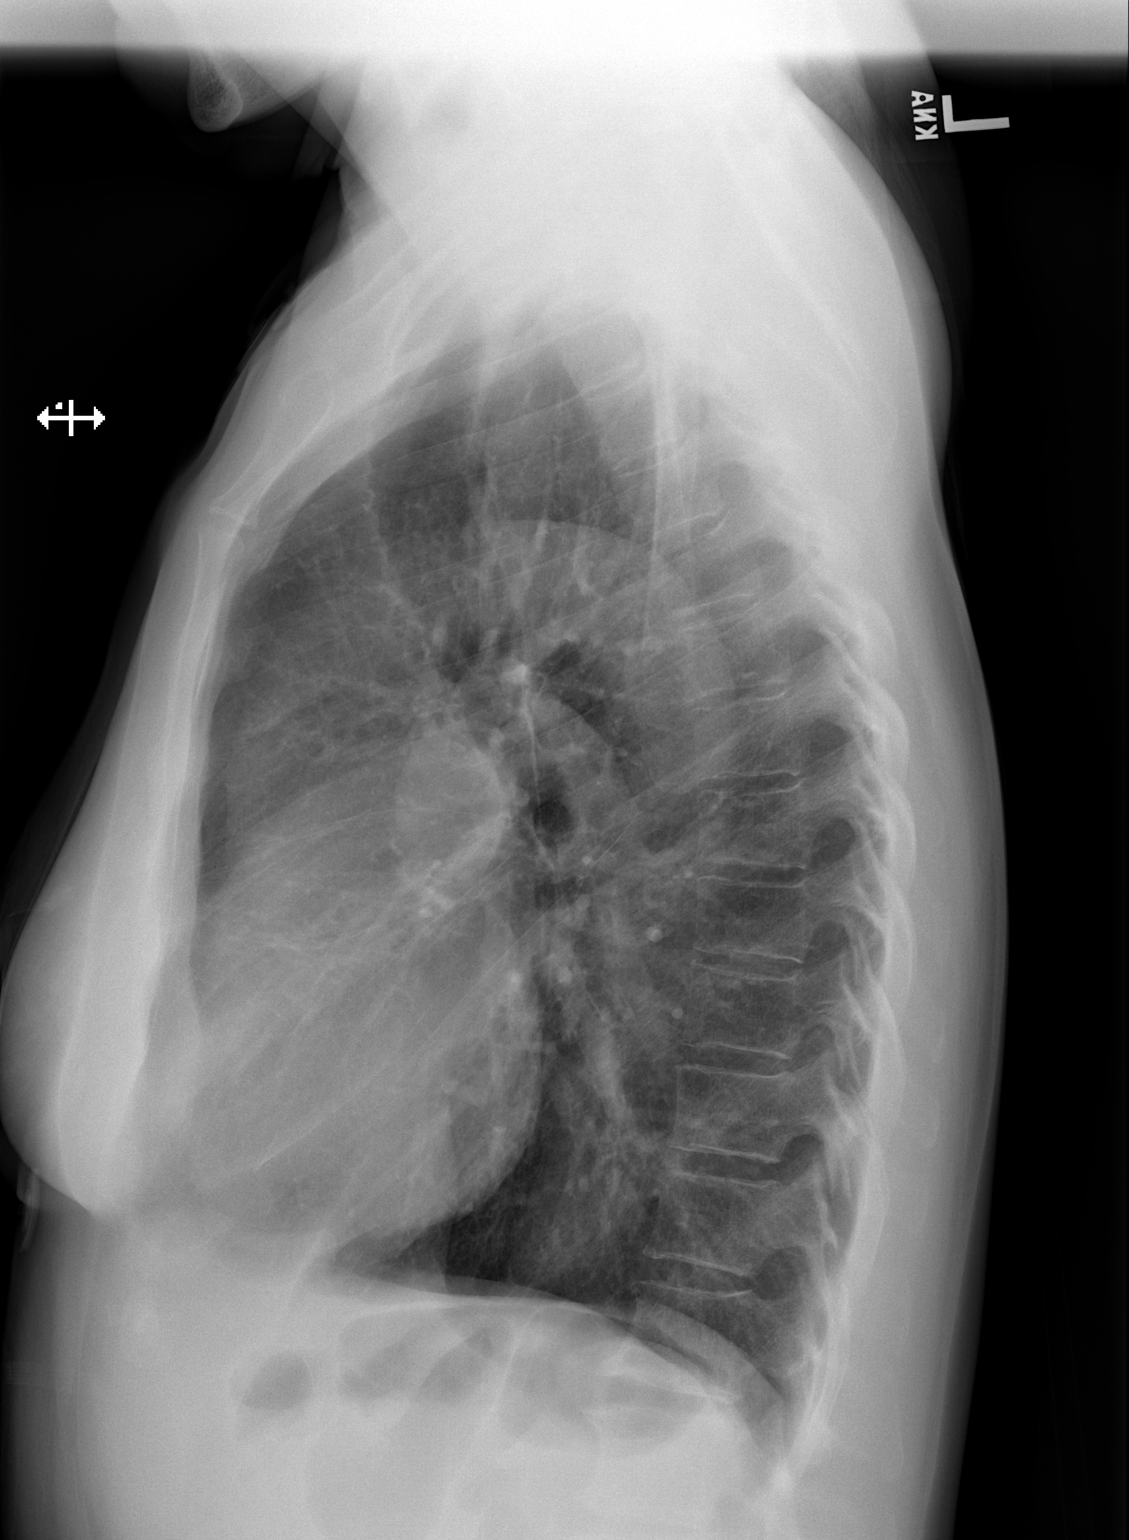

[2 of 2 positions shown; findings below may reference images not displayed]

FINDINGS: COPD with hyperinflation. Normal heart size. No effusion or
pneumothorax. Bones unremarkable. Thoracic atherosclerosis.
IMPRESSION: Stable exam.  COPD.  No active infiltrates or failure.

## 2018-05-26 MED ORDER — FLUTICASONE PROPIONATE HFA 220 MCG/ACT IN AERO: 1.0000 | INHALATION_SPRAY | Freq: Two times a day (BID) | RESPIRATORY_TRACT | 12 refills | Status: DC

## 2018-05-26 MED ORDER — TIOTROPIUM BROMIDE MONOHYDRATE 18 MCG IN CAPS: 18.0000 ug | ORAL_CAPSULE | Freq: Every day | RESPIRATORY_TRACT | 2 refills | Status: DC

## 2018-05-26 MED ORDER — CETIRIZINE HCL 10 MG PO CHEW: 10.0000 mg | CHEWABLE_TABLET | Freq: Every day | ORAL | 4 refills | Status: DC

## 2018-05-26 NOTE — Telephone Encounter (Signed)
Call the patient and sent the necessity refill authorization to her pharmacy.

## 2018-05-26 NOTE — Telephone Encounter (Signed)
Called pt, she thought she was to take 10mg  of losartan, informed her 100mg  1 tablet daily was the correct dose. She repeated back thanked me and call ended

## 2018-05-26 NOTE — Telephone Encounter (Signed)
Pt needs clarify how do she needs to take her blood pressure medicine pls have physician call her, pt contact [phone]

## 2018-05-26 NOTE — Telephone Encounter (Signed)
Please call pt back regarding inhaler.

## 2018-06-01 IMAGING — DX DG CHEST 2V
2 series · 2 of 2 positions shown · non-contrast
Comparison: Chest radiograph dated 02/07/2017

CLINICAL DATA: 58-year-old female with chest pain.

EXAM:
CHEST  2 VIEW

[chest pa]
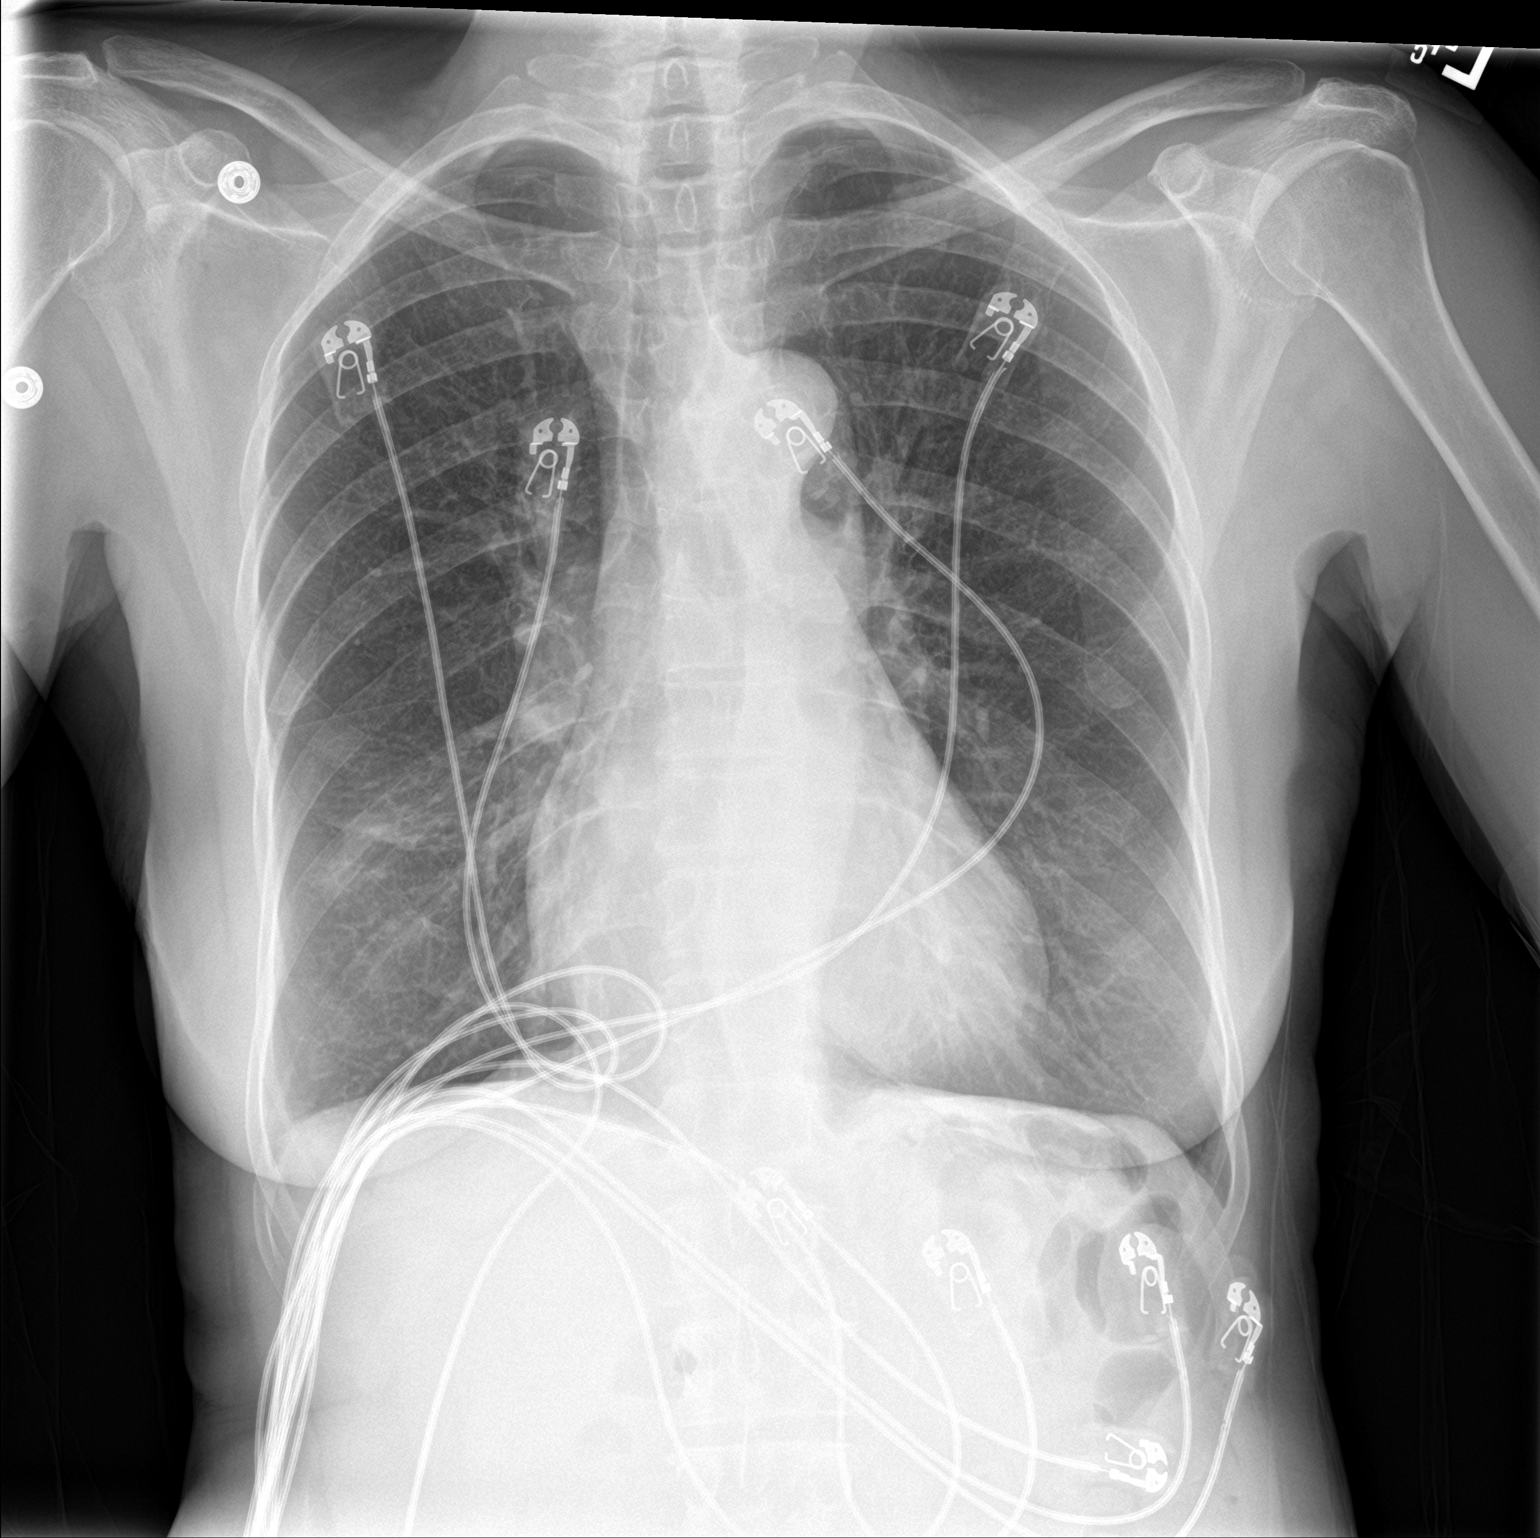

[chest lat]
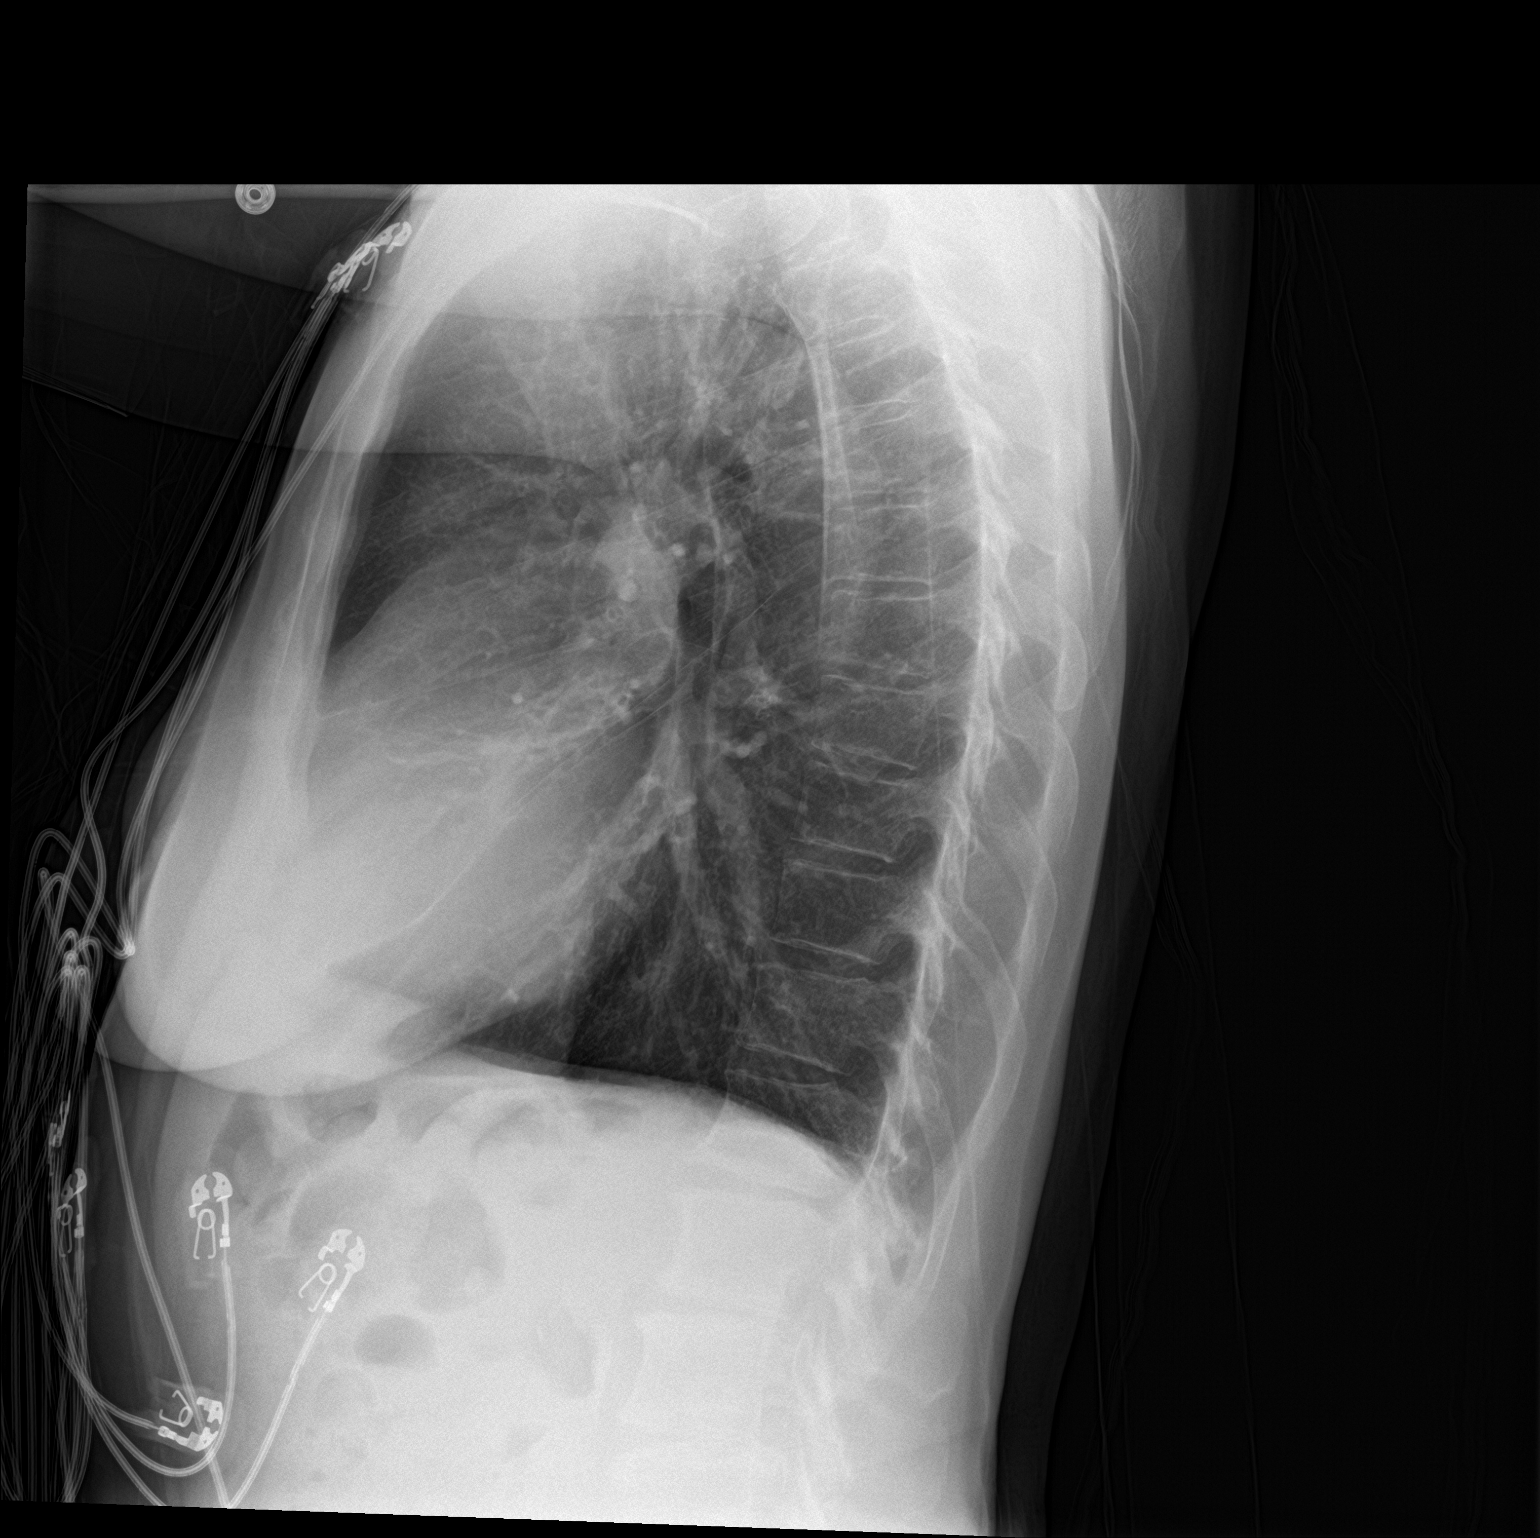

[2 of 2 positions shown; findings below may reference images not displayed]

FINDINGS: There is mild hyperinflation of the lungs suggestive of underlying
air trapping. Focal area of density in the right mid lung field
appears new compared to the prior radiograph and may represent
atelectasis versus developing infiltrate. Clinical correlation is
recommended. There is no pleural effusion or pneumothorax. The
cardiac silhouette is within normal limits with no acute osseous
pathology.
IMPRESSION: Right middle lobe atelectasis versus developing pneumonia. Clinical
correlation is recommended.

## 2018-06-02 ENCOUNTER — Telehealth: Payer: Self-pay

## 2018-06-02 NOTE — Telephone Encounter (Signed)
Pt states losartan (COZAAR) 100 MG tablet is causing her to have headache. Please call pt back.

## 2018-06-02 NOTE — Telephone Encounter (Signed)
Pt needs acc appt next week er dr Reesa Chew

## 2018-06-02 NOTE — Telephone Encounter (Signed)
Called the patient, told her that losartan should not be causing headaches. She was quite sure that it is losartan. Advised to go back to her previous dose of 50 mg daily and come to [facility] earlier next week for blood pressure check.

## 2018-06-02 NOTE — Telephone Encounter (Signed)
Dr Reesa Chew, losartan increased at appt 9/23, please advise

## 2018-06-03 ENCOUNTER — Emergency Department (HOSPITAL_COMMUNITY)
Admission: EM | Admit: 2018-06-03 | Discharge: 2018-06-03 | Disposition: A | Discharge status: Home / Self Care | Payer: Medicare Other | Attending: Emergency Medicine | Admitting: Emergency Medicine

## 2018-06-03 ENCOUNTER — Encounter (HOSPITAL_COMMUNITY): Payer: Self-pay | Admitting: Emergency Medicine

## 2018-06-03 ENCOUNTER — Emergency Department (HOSPITAL_COMMUNITY): Payer: Medicare Other

## 2018-06-03 DIAGNOSIS — R0981 Nasal congestion: Secondary | ICD-10-CM | POA: Insufficient documentation

## 2018-06-03 DIAGNOSIS — I10 Essential (primary) hypertension: Secondary | ICD-10-CM | POA: Diagnosis not present

## 2018-06-03 DIAGNOSIS — Z87891 Personal history of nicotine dependence: Secondary | ICD-10-CM | POA: Insufficient documentation

## 2018-06-03 DIAGNOSIS — Z79899 Other long term (current) drug therapy: Secondary | ICD-10-CM | POA: Insufficient documentation

## 2018-06-03 DIAGNOSIS — R0789 Other chest pain: Secondary | ICD-10-CM | POA: Insufficient documentation

## 2018-06-03 DIAGNOSIS — R079 Chest pain, unspecified: Secondary | ICD-10-CM | POA: Diagnosis not present

## 2018-06-03 DIAGNOSIS — J45909 Unspecified asthma, uncomplicated: Secondary | ICD-10-CM | POA: Diagnosis not present

## 2018-06-03 LAB — CBC
HCT: 39.5 % (ref 36.0–46.0)
Hemoglobin: 12.2 g/dL (ref 12.0–15.0)
MCH: 25.6 pg — AB (ref 26.0–34.0)
MCHC: 30.9 g/dL (ref 30.0–36.0)
MCV: 83 fL (ref 80.0–100.0)
PLATELETS: 246 10*3/uL (ref 150–400)
RBC: 4.76 MIL/uL (ref 3.87–5.11)
RDW: 15.5 % (ref 11.5–15.5)
WBC: 3.3 10*3/uL — ABNORMAL LOW (ref 4.0–10.5)
nRBC: 0 % (ref 0.0–0.2)

## 2018-06-03 LAB — BASIC METABOLIC PANEL
ANION GAP: 6 (ref 5–15)
BUN: 12 mg/dL (ref 8–23)
CALCIUM: 9.7 mg/dL (ref 8.9–10.3)
CO2: 24 mmol/L (ref 22–32)
Chloride: 109 mmol/L (ref 98–111)
Creatinine, Ser: 0.79 mg/dL (ref 0.44–1.00)
GLUCOSE: 98 mg/dL (ref 70–99)
Potassium: 3.6 mmol/L (ref 3.5–5.1)
Sodium: 139 mmol/L (ref 135–145)

## 2018-06-03 LAB — I-STAT TROPONIN, ED: TROPONIN I, POC: 0 ng/mL (ref 0.00–0.08)

## 2018-06-03 MED ORDER — LEVOCETIRIZINE DIHYDROCHLORIDE 5 MG PO TABS: 5.0000 mg | ORAL_TABLET | Freq: Every evening | ORAL | 0 refills | Status: DC

## 2018-06-03 NOTE — ED Triage Notes (Signed)
Pt denies CP. Pt does report sinus problems.

## 2018-06-03 NOTE — ED Provider Notes (Signed)
[facility] EMERGENCY DEPARTMENT Provider Note   CSN: [id number] Arrival date & time: 06/03/18  1308     History   Chief Complaint Chief Complaint  Patient presents with  . Chest Pain  . Nasal Congestion    HPI [name] is a 67 y.o. female.  HPI Patient presents with concern of nasal congestion, chest tightness. She acknowledges multiple medical issues, including cardiac disease, hypertension, and allergies. She notes that over the past 3 or 4 days she has had mild persistent chest tightness, without exertion or pleuritic component. Concurrently she has developed rhinorrhea, fullness about the maxillary sinuses bilaterally, and nose, without difficulty swallowing, speaking, breathing. No fever. No relief with cetirizine.  Past Medical History:  Diagnosis Date  . A-fib ([place])   . Allergy    seasonal  . Asthma   . Chronic anticoagulation   . Eczema   . GERD (gastroesophageal reflux disease)   . Hypertension   . PAF (paroxysmal atrial fibrillation) ([place]) 11/26/2015   April/May 2017: Multiple ED visits for A. Fib.  02/12/16: Improved on diltiazem IV. Cardiology consulted in the ED and discharge patient with diltiazem XR 120 mg once daily with 60 mg by mouth every 6 hours as needed for symptomatic palpitations, flecainide 50 mg twice daily. Referred to the atrial fibrillation clinic.  02/17/16: Seen in the atrial fibrillation clinic by NP Roderic Palau. Normal sin    Patient Active Problem List   Diagnosis Date Noted  . Acute pain of left knee 05/15/2018  . Hyperpigmented skin lesion 09/09/2017  . Osteoporosis without current pathological fracture 04/15/2017  . Normocytic anemia 10/19/2016  . Hyperlipidemia 03/18/2016  . Special screening for malignant neoplasms, colon 03/17/2016  . Eczema 03/17/2016  . Cervical cancer screening 03/17/2016  . Assistance with transportation 03/17/2016  . Hypertension   . GERD (gastroesophageal reflux disease)   .  PAF (paroxysmal atrial fibrillation) ([place]) 11/26/2015  . Chronic anticoagulation 11/26/2015  . Marijuana abuse 06/11/2015  . Asthma, mild intermittent     Past Surgical History:  Procedure Laterality Date  . ATRIAL FIBRILLATION ABLATION N/A 09/30/2017   Procedure: ATRIAL FIBRILLATION ABLATION;  Surgeon: Thompson Grayer, MD;  Location: [facility];  Service: Cardiovascular;  Laterality: N/A;  . TONSILLECTOMY    . TUBAL LIGATION       OB History   None      Home Medications    Prior to Admission medications   Medication Sig Start Date End Date Taking? Authorizing Provider  cetirizine (ZYRTEC) 10 MG chewable tablet Chew 1 tablet (10 mg total) by mouth daily. 05/26/18  Yes [name], MD  dicyclomine (BENTYL) 20 MG tablet Take 1 tablet (20 mg total) by mouth 2 (two) times daily. 01/25/18  Yes Carlisle Cater, PA-C  diltiazem (CARDIZEM) 60 MG tablet TAKE 1 TABLET BY MOUTH EVERY 6 HOURS AS NEEDED FOR RAPID AFIB HEART RATE OVER 100 Patient taking differently: Take 60 mg by mouth every 6 (six) hours as needed (rapid afib heart rate over 100.).  06/10/17  Yes Sherran Needs, NP  diltiazem (CARTIA XT) 120 MG 24 hr capsule TAKE 1 CAPSULE(120 MG) BY MOUTH EVERY EVENING Patient taking differently: Take 120 mg by mouth every evening. TAKE 1 CAPSULE(120 MG) BY MOUTH EVERY EVENING 01/02/18  Yes Sherran Needs, NP  esomeprazole (NEXIUM) 20 MG capsule TAKE 1 CAPSULE(20 MG) BY MOUTH DAILY Patient taking differently: Take 20 mg by mouth daily.  01/23/18  Yes [name], MD  fluticasone (FLOVENT HFA) 220 MCG/ACT inhaler Inhale 1 puff into the lungs 2 (two) times daily. 05/26/18  Yes [name], MD  HYDROcodone-acetaminophen (NORCO/VICODIN) 5-325 MG tablet Take 1 tablet by mouth every 6 (six) hours as needed for moderate pain or severe pain. 05/15/18  Yes [name], MD  levalbuterol [facility] HFA) 45 MCG/ACT inhaler Inhale 1 puff into the lungs every 6 (six) hours as needed for shortness of  breath. 11/21/17  Yes Carmin Muskrat, MD  losartan (COZAAR) 100 MG tablet Take 1 tablet (100 mg total) by mouth daily. 05/15/18  Yes [name], MD  magnesium hydroxide (MILK OF MAGNESIA) 400 MG/5ML suspension Take 15-30 mLs by mouth at bedtime as needed for mild constipation. 11/01/17  Yes Harris, Abigail, PA-C  montelukast (SINGULAIR) 10 MG tablet Take 1 tablet (10 mg total) by mouth at bedtime. 05/16/18  Yes [name], MD  Multiple Vitamin (MULTI-VITAMIN PO) Take 1 tablet by mouth daily. **Women Day 60+**   Yes [provider]  polyethylene glycol powder (GLYCOLAX/MIRALAX) powder Mix 17g of Miralax in 8 ounces of water every other day Patient taking differently: Take 17 g by mouth as needed for mild constipation.  07/20/17  Yes [name], MD  potassium chloride (K-DUR,KLOR-CON) 10 MEQ tablet Take 1 tablet (10 mEq total) by mouth 2 (two) times daily. 03/08/18  Yes Sherran Needs, NP  rivaroxaban (XARELTO) 20 MG TABS tablet Take 1 tablet (20 mg total) by mouth daily with supper. 10/25/17  Yes Sherran Needs, NP  simethicone (GAS-X) 80 MG chewable tablet Chew 1 tablet (80 mg total) by mouth every 6 (six) hours as needed for flatulence. 11/01/17  Yes Harris, Abigail, PA-C  tiotropium (SPIRIVA) 18 MCG inhalation capsule Place 1 capsule (18 mcg total) into inhaler and inhale daily. 05/26/18  Yes [name], MD  triamcinolone cream (KENALOG) 0.1 % Apply 1 application topically 2 (two) times daily. 05/06/18  Yes Henderly, Britni A, PA-C  linaclotide (LINZESS) 145 MCG CAPS capsule Take 1 capsule (145 mcg total) by mouth daily before breakfast. Patient not taking: Reported on 06/03/2018 02/10/18   Zehr, Janett Billow D, PA-C  sucralfate (CARAFATE) 1 GM/10ML suspension Take 10 mLs (1 g total) by mouth 4 (four) times daily. Patient not taking: Reported on 06/03/2018 01/30/18 01/30/19  Melanee Spry, MD    Family History Family History  Problem Relation Age of Onset  . Heart attack Mother          [name] age  . Breast cancer Sister        Late 64s; now s/p mastectomy   . Lung cancer Brother        x 2  . Cancer Sister   . Cancer Sister   . Cancer Sister   . Colon cancer Neg Hx   . Colon polyps Neg Hx   . Esophageal cancer Neg Hx   . Rectal cancer Neg Hx   . Stomach cancer Neg Hx     Social History Social History   Tobacco Use  . Smoking status: Former Smoker    Last attempt to quit: 10/24/2015    Years since quitting: 2.6  . Smokeless tobacco: Never Used  Substance Use Topics  . Alcohol use: No  . Drug use: No    Types: Marijuana    Comment: Smoked marijuana in the past.     Allergies   Albuterol; Celecoxib; and Protonix [pantoprazole sodium]   Review of Systems Review of Systems  Constitutional:  Per HPI, otherwise negative  HENT:       Per HPI, otherwise negative  Respiratory:       Per HPI, otherwise negative  Cardiovascular:       Per HPI, otherwise negative  Gastrointestinal: Negative for vomiting.  Endocrine:       Negative aside from HPI  Genitourinary:       Neg aside from HPI   Musculoskeletal:       Per HPI, otherwise negative  Skin: Negative.   Neurological: Negative for syncope.     Physical Exam Updated Vital Signs BP 127/83   Pulse 77   Temp 98.2 F (36.8 C)   Resp (!) 21   SpO2 96%   Physical Exam  Constitutional: She is oriented to person, place, and time. She appears well-developed and well-nourished. No distress.  HENT:  Head: Normocephalic and atraumatic.  Mouth/Throat: Uvula is midline and oropharynx is clear and moist.  Eyes: Conjunctivae and EOM are normal.  Cardiovascular: Normal rate and regular rhythm.  Pulmonary/Chest: Effort normal and breath sounds normal. No stridor. No respiratory distress.  Abdominal: She exhibits no distension.  Musculoskeletal: She exhibits no edema.  Neurological: She is alert and oriented to person, place, and time. No cranial nerve deficit.  Skin: Skin is warm and dry.   Psychiatric: She has a normal mood and affect.  Nursing note and vitals reviewed.    ED Treatments / Results  Labs (all labs ordered are listed, but only abnormal results are displayed) Labs Reviewed  CBC - Abnormal; Notable for the following components:      Result Value   WBC 3.3 (*)    MCH 25.6 (*)    All other components within normal limits  BASIC METABOLIC PANEL  I-STAT TROPONIN, ED    EKG EKG Interpretation  Date/Time:  Saturday June 03 2018 13:13:59 EDT Ventricular Rate:  87 PR Interval:  118 QRS Duration: 82 QT Interval:  382 QTC Calculation: 459 R Axis:   65 Text Interpretation:  Sinus rhythm with occasional Premature ventricular complexes Otherwise normal ECG Abnormal ekg Confirmed by Carmin Muskrat [id number]) on 06/03/2018 1:40:52 PM Also confirmed by Carmin Muskrat ([id number]), editor [place], Jeannetta Nap [id number])  on 06/03/2018 1:49:51 PM   Radiology Dg Chest 2 View  Result Date: 06/03/2018 CLINICAL DATA:  Chest pain for 3 days, runny nose and congestion. History of hypertension and atrial fibrillation. EXAM: CHEST - 2 VIEW COMPARISON:  Chest x-ray dated 02/14/2018. FINDINGS: Lungs are hyperexpanded. Lungs are clear. No pleural effusion or pneumothorax seen. Heart size and mediastinal contours are within normal limits. No acute or suspicious osseous finding. IMPRESSION: 1. No acute findings.  No evidence of pneumonia or pulmonary edema. 2. Hyperexpanded lungs suggesting COPD. Electronically Signed   By: Franki Cabot M.D.   On: 06/03/2018 14:12    Procedures Procedures (including critical care time)    Initial Impression / Assessment and Plan / ED Course  I have reviewed the triage vital signs and the nursing notes.  Pertinent labs & imaging results that were available during my care of the patient were reviewed by me and considered in my medical decision making (see chart for details).  On repeat exam the patient is awake, alert, in no distress. We reviewed  all findings including no evidence for ACS, with reassuring EKG, labs, reviewed by me. Some suspicion for seasonal allergies, patient will have adjustment in her medications, will follow up with primary care.   Final Clinical Impressions(s) / ED Diagnoses  Atypical chest pain Sinus congestion   Carmin Muskrat, MD 06/03/18 1524

## 2018-06-03 NOTE — ED Triage Notes (Signed)
Pt reports she has had chest pain for 3 days, she also has a runny nose and congestion.

## 2018-06-03 NOTE — Discharge Instructions (Addendum)
As discussed, your evaluation today has been largely reassuring.  But, it is important that you monitor your condition carefully, and do not hesitate to return to the ED if you develop new, or concerning changes in your condition.  Please obtain your new prescription and take the tablets instead of your current medication.  Otherwise, please follow-up with your physician for appropriate ongoing care.

## 2018-06-03 NOTE — ED Notes (Signed)
Declined W/C at D/C and was escorted to lobby by RN. 

## 2018-06-03 NOTE — ED Triage Notes (Signed)
Pt denies CP

## 2018-06-04 ENCOUNTER — Other Ambulatory Visit: Payer: Self-pay | Admitting: Internal Medicine

## 2018-06-04 ENCOUNTER — Other Ambulatory Visit (HOSPITAL_COMMUNITY): Payer: Self-pay | Admitting: Nurse Practitioner

## 2018-06-04 ENCOUNTER — Other Ambulatory Visit: Payer: Self-pay | Admitting: Student in an Organized Health Care Education/Training Program

## 2018-06-04 DIAGNOSIS — Z748 Other problems related to care provider dependency: Secondary | ICD-10-CM

## 2018-06-04 DIAGNOSIS — K219 Gastro-esophageal reflux disease without esophagitis: Secondary | ICD-10-CM

## 2018-06-04 DIAGNOSIS — I10 Essential (primary) hypertension: Secondary | ICD-10-CM

## 2018-06-05 ENCOUNTER — Telehealth: Payer: Self-pay | Admitting: Internal Medicine

## 2018-06-05 NOTE — Telephone Encounter (Signed)
Pt has a question regarding the medicine (levocetirizine) new sinus, pt contact [phone]

## 2018-06-05 NOTE — Telephone Encounter (Signed)
Spoke with the patient.  Appt has been sch for 06/08/2018 with [facility].

## 2018-06-05 NOTE — Telephone Encounter (Signed)
rtc to pt, she states she went to ED for chest pain and sinus medicine, states her doctor in the clinic told her to buy otc but the doctor in ED prescribed something and she is letting her doctor know. She is thanked and encouraged, called ended

## 2018-06-08 ENCOUNTER — Ambulatory Visit: Payer: Self-pay

## 2018-06-13 ENCOUNTER — Ambulatory Visit (INDEPENDENT_AMBULATORY_CARE_PROVIDER_SITE_OTHER): Payer: Medicare Other | Admitting: Internal Medicine

## 2018-06-13 ENCOUNTER — Other Ambulatory Visit: Payer: Self-pay

## 2018-06-13 ENCOUNTER — Other Ambulatory Visit: Payer: Self-pay | Admitting: Internal Medicine

## 2018-06-13 VITALS — BP 151/93 | HR 84 | Temp 97.4°F | Ht 67.0 in | Wt 154.6 lb

## 2018-06-13 DIAGNOSIS — K219 Gastro-esophageal reflux disease without esophagitis: Secondary | ICD-10-CM | POA: Diagnosis not present

## 2018-06-13 DIAGNOSIS — Z79899 Other long term (current) drug therapy: Secondary | ICD-10-CM | POA: Diagnosis not present

## 2018-06-13 DIAGNOSIS — M159 Polyosteoarthritis, unspecified: Secondary | ICD-10-CM

## 2018-06-13 DIAGNOSIS — M150 Primary generalized (osteo)arthritis: Secondary | ICD-10-CM

## 2018-06-13 DIAGNOSIS — I10 Essential (primary) hypertension: Secondary | ICD-10-CM

## 2018-06-13 DIAGNOSIS — R0981 Nasal congestion: Secondary | ICD-10-CM | POA: Diagnosis not present

## 2018-06-13 MED ORDER — SPIRONOLACTONE 25 MG PO TABS: 25.0000 mg | ORAL_TABLET | Freq: Every day | ORAL | 1 refills | Status: DC

## 2018-06-13 MED ORDER — DICLOFENAC SODIUM 1 % TD GEL: 2.0000 g | Freq: Four times a day (QID) | TRANSDERMAL | 3 refills | Status: DC

## 2018-06-13 MED ORDER — DM-GUAIFENESIN ER 30-600 MG PO TB12: 1.0000 | ORAL_TABLET | Freq: Two times a day (BID) | ORAL | 0 refills | Status: DC

## 2018-06-13 MED ORDER — ESOMEPRAZOLE MAGNESIUM 20 MG PO CPDR: 40.0000 mg | DELAYED_RELEASE_CAPSULE | Freq: Every day | ORAL | 1 refills | Status: DC

## 2018-06-13 NOTE — Patient Instructions (Addendum)
[name], It was a pleasure meeting you!   Today we discussed:  1. Blood pressure - I am prescribing a medication called Spironolactone for you to take once daily to help get better control of blood pressure. Please come for a lab visit in 2 weeks to check your electrolytes.   2. Burning in your stomach - You can start taking 2 tablets of the Nexium for your worsening acid reflux symptoms.   3. Knee pain - I am going to give you an anti-inflammatory gel that has shown to work as well as taking a pill but with less stomach side effects. You can apply it to your knee 4 times a day.   You will follow-up with Dr. Reesa Chew in December to see how these medication adjustments are doing. Please call with any questions or concerns in the mean time.

## 2018-06-13 NOTE — Progress Notes (Signed)
   CC: HTN   HPI:  [name] is a 67 y.o. female with PMHx listed below who presents for follow-up on HTN. Patient's anti-hypertensive regimen consists of Diltiazem 120 mg and Losartan 50 mg. At last PCP visit, Losartan was increased to 100 mg for better blood pressure control, but she was not able to tolerate due to reported headaches and GI upset. She denies headaches, vision changes, shortness of breath, lower extremity edema.   She also complains of persistent left knee pain. She had arthrocentesis done at last PCP visit on 9/23. Fluid analysis consistent with OA. She as given short course of Vicodin for acute pain. Patient states this has been the only thing that has helped her pain since onset and is requesting more.   Past Medical History:  Diagnosis Date  . A-fib ([place])   . Allergy    seasonal  . Asthma   . Chronic anticoagulation   . Eczema   . GERD (gastroesophageal reflux disease)   . Hypertension   . PAF (paroxysmal atrial fibrillation) ([place]) 11/26/2015   April/May 2017: Multiple ED visits for A. Fib.  02/12/16: Improved on diltiazem IV. Cardiology consulted in the ED and discharge patient with diltiazem XR 120 mg once daily with 60 mg by mouth every 6 hours as needed for symptomatic palpitations, flecainide 50 mg twice daily. Referred to the atrial fibrillation clinic.  02/17/16: Seen in the atrial fibrillation clinic by NP Roderic Palau. Normal sin   Review of Systems:   Constitutional: no fevers, chills, weight changes HEENT: positive for nasal congestion and intermittent cough; negative for sore throat or ear pain GI: positive for burning in her stomach, belching  Physical Exam:  Vitals:   06/13/18 1023  BP: (!) 151/93  Pulse: 84  Temp: (!) 97.4 F (36.3 C)  TempSrc: Oral  SpO2: 100%  Weight: 154 lb 9.6 oz (70.1 kg)  Height: 5\' 7"  (1.702 m)   General: alert, pleasant female, appears stated age, NAD HEENT: clear nasal congestion; boggy nasal mucosa. Posterior  oropharynx with mild erythema Neck: supple. No LAD CV: RRR; no murmurs, rubs or gallops Pulm: normal respiratory effort; lungs CTA bilaterally MSK: left knee with TTP of medial joint line; no joint laxity; no crepitus; limited ROM due to pain. No lower extremity edema    Assessment & Plan:   See Encounters Tab for problem based charting.  Patient seen with Dr. Lynnae January

## 2018-06-14 ENCOUNTER — Encounter: Payer: Self-pay | Admitting: Internal Medicine

## 2018-06-14 DIAGNOSIS — M150 Primary generalized (osteo)arthritis: Secondary | ICD-10-CM

## 2018-06-14 DIAGNOSIS — M159 Polyosteoarthritis, unspecified: Secondary | ICD-10-CM | POA: Insufficient documentation

## 2018-06-14 DIAGNOSIS — J329 Chronic sinusitis, unspecified: Secondary | ICD-10-CM | POA: Insufficient documentation

## 2018-06-14 NOTE — Assessment & Plan Note (Signed)
She complains of persistent left knee pain. Requesting more Vicodin, but explained that this is not the first line treatment for OA long-term and that she was only given that in the initial acute setting after a procedure. Given her history of GI sensitivity to medications and GERD, will start with Diclofenac gel 4 times daily. She will follow-up with PCP in 1 month and can escalate therapy as needed. Could also consider referral to PT.

## 2018-06-14 NOTE — Assessment & Plan Note (Signed)
Nasal congestion and watery eyes consistent with seasonal allergies. Also endorses some chest congestion. She has been taking Cetirizine with some relief, but is requesting something to loosen up mucous. She is not amenable to nasal sprays. Will continue Cetirizine and also prescribe Mucinex to help with chest congestion.

## 2018-06-14 NOTE — Assessment & Plan Note (Signed)
Blood pressure elevated at 151/93. She did not tolerate increased dose of Losartan. Since she is already on CCB. Per record review, previously on HCTZ which was stopped due to hypokalemia. Will start Spironolactone 25 mg and follow-up with repeat BMET in 2 weeks.

## 2018-06-14 NOTE — Assessment & Plan Note (Signed)
Endorses bad GERD symptoms. Will increase Nexium dose and monitor for improvement at next PCP visit.

## 2018-06-14 NOTE — Progress Notes (Signed)
Internal Medicine Clinic Attending  I saw and evaluated the patient.  I personally confirmed the key portions of the history and exam documented by Dr. Bloomfield and I reviewed pertinent patient test results.  The assessment, diagnosis, and plan were formulated together and I agree with the documentation in the resident's note.  

## 2018-06-15 ENCOUNTER — Telehealth: Payer: Self-pay | Admitting: Internal Medicine

## 2018-06-15 ENCOUNTER — Other Ambulatory Visit: Payer: Self-pay

## 2018-06-15 NOTE — Telephone Encounter (Signed)
HYDROcodone-acetaminophen (NORCO/VICODIN) 5-325 MG tablet  Refill request @  [facility] [place], [place] AT [address] [phone] (Phone) [phone] (Fax)

## 2018-06-15 NOTE — Telephone Encounter (Signed)
Called patient about her message. Patient complaining of fluttering and elevated BP. Patient saw her PCP 06/13/18, who prescribed patient spironolactone for patient's BP. Asked patient if Spironolactone was helping with her BP. Patient stated she has not picked up the medication yet. Encouraged patient to pick up her Spironolactone. Encouraged patient to take her Cardizem 60 mg as need every 6 hours for rapid Afib with a heart rate over 100. Patient had an appointment with A. FIB clinic in less then 2 weeks. Encourage patient to keep her appointment, start her spironolactone, and take Cardizem 60 mg as needed. Patient verbalized understanding.

## 2018-06-15 NOTE — Telephone Encounter (Signed)
Pt calling back about   HYDROcodone-acetaminophen (NORCO/VICODIN) 5-325 MG tablet, States she is in pain and want this med by today.

## 2018-06-15 NOTE — Telephone Encounter (Signed)
New Message          Patient c/o Palpitations:  High priority if patient c/o lightheadedness, shortness of breath, or chest pain  1) How long have you had palpitations/irregular HR/ Afib? Are you having the symptoms now? Yes  2) Are you currently experiencing lightheadedness, SOB or CP? NO  3) Do you have a history of afib (atrial fibrillation) or irregular heart rhythm? More of a flutter  4) Have you checked your BP or HR? (document readings if available): B/p 151/93    5) Are you experiencing any other symptoms? Nothing other than fluttering of the heart

## 2018-06-15 NOTE — Telephone Encounter (Signed)
That was just one time medicine. We do not prescribe narcotics like this.

## 2018-06-15 NOTE — Telephone Encounter (Signed)
Pt calling back about her HYDROcodone-acetaminophen (NORCO/VICODIN) 5-325 MG tablet.  Pt also has questions about her meds and would like a call back.

## 2018-06-15 NOTE — Telephone Encounter (Signed)
Last rx written 9/23. Last OV 10/22. Next OV 11/5 in [place]. UDS 11/26/15.

## 2018-06-16 NOTE — Telephone Encounter (Signed)
Called pt- informed refill for Hydrocodone was denied.

## 2018-06-22 ENCOUNTER — Ambulatory Visit: Payer: Self-pay | Admitting: *Deleted

## 2018-06-24 ENCOUNTER — Emergency Department (HOSPITAL_COMMUNITY): Payer: Medicare Other

## 2018-06-24 ENCOUNTER — Encounter (HOSPITAL_COMMUNITY): Payer: Self-pay | Admitting: Emergency Medicine

## 2018-06-24 ENCOUNTER — Emergency Department (HOSPITAL_COMMUNITY)
Admission: EM | Admit: 2018-06-24 | Discharge: 2018-06-24 | Disposition: A | Discharge status: Home / Self Care | Payer: Medicare Other | Attending: Emergency Medicine | Admitting: Emergency Medicine

## 2018-06-24 DIAGNOSIS — Z7901 Long term (current) use of anticoagulants: Secondary | ICD-10-CM | POA: Diagnosis not present

## 2018-06-24 DIAGNOSIS — Z87891 Personal history of nicotine dependence: Secondary | ICD-10-CM | POA: Diagnosis not present

## 2018-06-24 DIAGNOSIS — Z79899 Other long term (current) drug therapy: Secondary | ICD-10-CM | POA: Diagnosis not present

## 2018-06-24 DIAGNOSIS — R079 Chest pain, unspecified: Secondary | ICD-10-CM | POA: Diagnosis not present

## 2018-06-24 DIAGNOSIS — I10 Essential (primary) hypertension: Secondary | ICD-10-CM | POA: Diagnosis not present

## 2018-06-24 DIAGNOSIS — J45909 Unspecified asthma, uncomplicated: Secondary | ICD-10-CM | POA: Diagnosis not present

## 2018-06-24 DIAGNOSIS — R002 Palpitations: Secondary | ICD-10-CM | POA: Insufficient documentation

## 2018-06-24 LAB — CBC
HEMATOCRIT: 41.6 % (ref 36.0–46.0)
Hemoglobin: 13.1 g/dL (ref 12.0–15.0)
MCH: 25.8 pg — ABNORMAL LOW (ref 26.0–34.0)
MCHC: 31.5 g/dL (ref 30.0–36.0)
MCV: 82.1 fL (ref 80.0–100.0)
NRBC: 0 % (ref 0.0–0.2)
PLATELETS: 233 10*3/uL (ref 150–400)
RBC: 5.07 MIL/uL (ref 3.87–5.11)
RDW: 15.6 % — AB (ref 11.5–15.5)
WBC: 4.7 10*3/uL (ref 4.0–10.5)

## 2018-06-24 LAB — TROPONIN I: Troponin I: <0.03 ng/mL (ref <0.03)

## 2018-06-24 LAB — BASIC METABOLIC PANEL
Anion gap: 9 (ref 5–15)
BUN: 9 mg/dL (ref 8–23)
CALCIUM: 9.7 mg/dL (ref 8.9–10.3)
CHLORIDE: 110 mmol/L (ref 98–111)
CO2: 21 mmol/L — AB (ref 22–32)
CREATININE: 0.74 mg/dL (ref 0.44–1.00)
GFR calc non Af Amer: >60 mL/min (ref >60)
GLUCOSE: 111 mg/dL — AB (ref 70–99)
Potassium: 3.3 mmol/L — ABNORMAL LOW (ref 3.5–5.1)
Sodium: 140 mmol/L (ref 135–145)

## 2018-06-24 LAB — I-STAT TROPONIN, ED: Troponin i, poc: 0 ng/mL (ref 0.00–0.08)

## 2018-06-24 MED ORDER — ACETAMINOPHEN 325 MG PO TABS
650.0000 mg | ORAL_TABLET | Freq: Once | ORAL | Status: AC
  Administered 2018-06-24: 650 mg via ORAL
  Filled 2018-06-24: qty 2

## 2018-06-24 NOTE — ED Provider Notes (Addendum)
[facility]ERGENC[facility] DEPARTMENT Provider Note   CSN: [id number] Arrival date & time: 06/24/18  3086     History   Chief Complaint Chief Complaint  Patient presents with  . Chest Pain    HPI [name] is a 67 y.o. female with history of atrial fibrillation presenting today for chest pain/palpitations.  Patient states that she has been experiencing occasional chest pain/palpitations for the past few months.  Patient presenting today for complaint of palpitations that began at midnight after her upstairs neighbors dropped a large object.  Patient states that she takes diltiazem 50 mg tablets for palpitations as prescribed by her cardiologist.  Patient describes her palpitations as a fast heart rate/fluttering in the center of her chest.  Patient states that her palpitations have been severe since last night, she states that she took one 50 mg tablet of diltiazem at midnight and attempted to go back to sleep.  She awoke again at 3 AM with palpitations and took a second 50 mg tablet of diltiazem and again attempted to sleep.  Patient then took a third 50 mg tablet of diltiazem at 6 AM before presenting to the emergency department.  Patient also endorses a chest tightness that she developed after taking her third dose of diltiazem.  Patient states that the tightness was in the center of her chest, mild intensity and was constant for approximately 1 hour.  Patient states that her chest tightness has resolved prior to my evaluation today.  On my evaluation patient is resting comfortably in bed, no acute distress.  Patient not endorsing palpitations or chest tightness at this time.  Regular rate and rhythm, SPO2 100% on room air and normotensive.  Of note patient endorses compliance with her Xarelto regimen. HPI  Past Medical History:  Diagnosis Date  . A-fib ([place])   . Allergy    seasonal  . Asthma   . Chronic anticoagulation   . Eczema   . GERD (gastroesophageal  reflux disease)   . Hypertension   . PAF (paroxysmal atrial fibrillation) ([place]) 11/26/2015   April/May 2017: Multiple ED visits for A. Fib.  02/12/16: Improved on diltiazem IV. Cardiology consulted in the ED and discharge patient with diltiazem XR 120 mg once daily with 60 mg by mouth every 6 hours as needed for symptomatic palpitations, flecainide 50 mg twice daily. Referred to the atrial fibrillation clinic.  02/17/16: Seen in the atrial fibrillation clinic by NP Roderic Palau. Normal sin    Patient Active Problem List   Diagnosis Date Noted  . Nasal congestion 06/14/2018  . Primary osteoarthritis involving multiple joints 06/14/2018  . Acute pain of left knee 05/15/2018  . Hyperpigmented skin lesion 09/09/2017  . Osteoporosis without current pathological fracture 04/15/2017  . Normocytic anemia 10/19/2016  . Hyperlipidemia 03/18/2016  . Special screening for malignant neoplasms, colon 03/17/2016  . Eczema 03/17/2016  . Cervical cancer screening 03/17/2016  . Assistance with transportation 03/17/2016  . Hypertension   . GERD (gastroesophageal reflux disease)   . PAF (paroxysmal atrial fibrillation) ([place]) 11/26/2015  . Chronic anticoagulation 11/26/2015  . Marijuana abuse 06/11/2015  . Asthma, mild intermittent     Past Surgical History:  Procedure Laterality Date  . ATRIAL FIBRILLATION ABLATION N/A 09/30/2017   Procedure: ATRIAL FIBRILLATION ABLATION;  Surgeon: Thompson Grayer, MD;  Location: [facility];  Service: Cardiovascular;  Laterality: N/A;  . TONSILLECTOMY    . TUBAL LIGATION       OB History  None      Home Medications    Prior to Admission medications   Medication Sig Start Date End Date Taking? Authorizing Provider  diltiazem (CARDIZEM) 60 MG tablet TAKE 1 TABLET BY MOUTH EVERY 6 HOURS AS NEEDED FOR RAPID AFIB HEART RATE OVER 100 Patient taking differently: Take 60 mg by mouth every 6 (six) hours as needed (rapid afib heart rate over 100.).  06/10/17  Yes  Sherran Needs, NP  diltiazem (CARTIA XT) 120 MG 24 hr capsule TAKE 1 CAPSULE(120 MG) BY MOUTH EVERY EVENING Patient taking differently: Take 120 mg by mouth every evening. TAKE 1 CAPSULE(120 MG) BY MOUTH EVERY EVENING 01/02/18  Yes Sherran Needs, NP  esomeprazole (NEXIUM) 20 MG capsule Take 2 capsules (40 mg total) by mouth daily. Patient taking differently: Take 20 mg by mouth daily.  06/13/18 12/10/18 Yes Bloomfield, Carley D, DO  FLOVENT HFA 44 MCG/ACT inhaler Inhale 1 puff into the lungs 2 (two) times daily. 06/06/18  Yes [provider]  levocetirizine (XYZAL) 5 MG tablet Take 1 tablet (5 mg total) by mouth every evening. 06/03/18  Yes Carmin Muskrat, MD  losartan (COZAAR) 100 MG tablet Take 1 tablet (100 mg total) by mouth daily. 05/15/18  Yes [name], MD  magnesium hydroxide (MILK OF MAGNESIA) 400 MG/5ML suspension Take 15-30 mLs by mouth at bedtime as needed for mild constipation. 11/01/17  Yes Harris, Abigail, PA-C  montelukast (SINGULAIR) 10 MG tablet Take 1 tablet (10 mg total) by mouth at bedtime. 05/16/18  Yes [name], MD  Multiple Vitamin (MULTI-VITAMIN PO) Take 1 tablet by mouth daily. **Women Day 60+**   Yes [provider]  polyethylene glycol powder (GLYCOLAX/MIRALAX) powder Mix 17g of Miralax in 8 ounces of water every other day Patient taking differently: Take 17 g by mouth as needed for mild constipation.  07/20/17  Yes [name], MD  potassium chloride (K-DUR,KLOR-CON) 10 MEQ tablet Take 1 tablet (10 mEq total) by mouth 2 (two) times daily. 03/08/18  Yes Sherran Needs, NP  rivaroxaban (XARELTO) 20 MG TABS tablet Take 1 tablet (20 mg total) by mouth daily with supper. 10/25/17  Yes Sherran Needs, NP  spironolactone (ALDACTONE) 25 MG tablet Take 1 tablet (25 mg total) by mouth daily. 06/13/18  Yes Bloomfield, Carley D, DO  tiotropium (SPIRIVA) 18 MCG inhalation capsule Place 1 capsule (18 mcg total) into inhaler and inhale daily. 05/26/18   Yes [name], MD  triamcinolone cream (KENALOG) 0.1 % Apply 1 application topically 2 (two) times daily. 05/06/18  Yes Henderly, Britni A, PA-C  dextromethorphan-guaiFENesin (MUCINEX DM) 30-600 MG 12hr tablet Take 1 tablet by mouth 2 (two) times daily. Patient not taking: Reported on 06/24/2018 06/13/18   Modena Nunnery D, DO  diclofenac sodium (VOLTAREN) 1 % GEL Apply 2 g topically 4 (four) times daily. Patient not taking: Reported on 06/24/2018 06/13/18   Modena Nunnery D, DO  dicyclomine (BENTYL) 20 MG tablet Take 1 tablet (20 mg total) by mouth 2 (two) times daily. Patient not taking: Reported on 06/24/2018 01/25/18   Carlisle Cater, PA-C  diltiazem (CARTIA XT) 120 MG 24 hr capsule TAKE 1 CAPSULE(120 MG) BY MOUTH EVERY EVENING Patient not taking: Reported on 06/24/2018 06/05/18   Sherran Needs, NP  fluticasone (FLOVENT HFA) 220 MCG/ACT inhaler Inhale 1 puff into the lungs 2 (two) times daily. Patient not taking: Reported on 06/24/2018 05/26/18   [name], MD  HYDROcodone-acetaminophen (NORCO/VICODIN) 5-325 MG tablet Take 1 tablet by  mouth every 6 (six) hours as needed for moderate pain or severe pain. Patient not taking: Reported on 06/24/2018 05/15/18   [name], MD  levalbuterol [facility] HFA) 45 MCG/ACT inhaler Inhale 1 puff into the lungs every 6 (six) hours as needed for shortness of breath. Patient not taking: Reported on 06/24/2018 11/21/17   Carmin Muskrat, MD  linaclotide [facility]) 145 MCG CAPS capsule Take 1 capsule (145 mcg total) by mouth daily before breakfast. Patient not taking: Reported on 06/03/2018 02/10/18   Zehr, Laban Emperor, PA-C  simethicone (GAS-X) 80 MG chewable tablet Chew 1 tablet (80 mg total) by mouth every 6 (six) hours as needed for flatulence. Patient not taking: Reported on 06/24/2018 11/01/17   Margarita Mail, PA-C  sucralfate (CARAFATE) 1 GM/10ML suspension Take 10 mLs (1 g total) by mouth 4 (four) times daily. Patient not taking: Reported on  06/03/2018 01/30/18 01/30/19  Melanee Spry, MD  XARELTO 20 MG TABS tablet TAKE 1 TABLET(20 MG) BY MOUTH DAILY WITH SUPPER Patient not taking: Reported on 06/24/2018 06/05/18   Sherran Needs, NP    Family History Family History  Problem Relation Age of Onset  . Heart attack Mother        [name] age  . Breast cancer Sister        Late 50s; now s/p mastectomy   . Lung cancer Brother        x 2  . Cancer Sister   . Cancer Sister   . Cancer Sister   . Colon cancer Neg Hx   . Colon polyps Neg Hx   . Esophageal cancer Neg Hx   . Rectal cancer Neg Hx   . Stomach cancer Neg Hx     Social History Social History   Tobacco Use  . Smoking status: Former Smoker    Last attempt to quit: 10/24/2015    Years since quitting: 2.6  . Smokeless tobacco: Never Used  Substance Use Topics  . Alcohol use: No  . Drug use: No    Types: Marijuana    Comment: Smoked marijuana in the past.     Allergies   Albuterol; Celecoxib; and Protonix [pantoprazole sodium]   Review of Systems Review of Systems  Constitutional: Negative.  Negative for chills and fever.  HENT: Negative.  Negative for rhinorrhea and sore throat.   Eyes: Negative.  Negative for visual disturbance.  Respiratory: Positive for chest tightness. Negative for cough and shortness of breath.   Cardiovascular: Positive for chest pain and palpitations. Negative for leg swelling.  Gastrointestinal: Negative.  Negative for abdominal pain, diarrhea, nausea and vomiting.  Genitourinary: Negative.  Negative for dysuria and hematuria.  Musculoskeletal: Negative.  Negative for arthralgias and myalgias.  Skin: Negative.  Negative for rash.  Neurological: Negative.  Negative for dizziness, weakness and headaches.   Physical Exam Updated Vital Signs BP (!) 166/85 (BP Location: Right Arm)   Pulse 80   Temp 97.9 F (36.6 C) (Oral)   Resp (!) 22   SpO2 98%   Physical Exam  Constitutional: She appears well-developed and  well-nourished. She does not appear ill. No distress.  HENT:  Head: Normocephalic and atraumatic.  Right Ear: External ear normal.  Left Ear: External ear normal.  Nose: Nose normal.  Mouth/Throat: Oropharynx is clear and moist.  Eyes: Pupils are equal, round, and reactive to light. EOM are normal.  Neck: Trachea normal and normal range of motion. Neck supple. No tracheal deviation present.  Cardiovascular: Normal rate, regular  rhythm, normal heart sounds, intact distal pulses and normal pulses.  Pulses:      Radial pulses are 2+ on the right side, and 2+ on the left side.       Dorsalis pedis pulses are 2+ on the right side, and 2+ on the left side.       Posterior tibial pulses are 2+ on the right side, and 2+ on the left side.  Pulmonary/Chest: Effort normal and breath sounds normal. No accessory muscle usage. No respiratory distress. She has no decreased breath sounds.  No pleuritic chest pain.  Abdominal: Soft. Bowel sounds are normal. There is no tenderness. There is no rigidity, no rebound and no guarding.  Musculoskeletal: Normal range of motion.       Right upper arm: Normal.       Left upper arm: Normal.       Right lower leg: Normal. She exhibits no tenderness, no swelling and no edema.       Left lower leg: Normal. She exhibits no tenderness, no swelling and no edema.  Feet:  Right Foot:  Protective Sensation: 3 sites tested. 3 sites sensed.  Left Foot:  Protective Sensation: 3 sites tested. 3 sites sensed.  Neurological: She is alert. GCS eye subscore is 4. GCS verbal subscore is 5. GCS motor subscore is 6.  Speech is clear and goal oriented, follows commands Major Cranial nerves without deficit, no facial droop Normal strength in upper and lower extremities bilaterally including dorsiflexion and plantar flexion, strong and equal grip strength Sensation normal to light touch Moves extremities without ataxia, coordination intact Normal gait  Skin: Skin is warm and dry.  Capillary refill takes less than 2 seconds.  Psychiatric: She has a normal mood and affect. Her behavior is normal.   ED Treatments / Results  Labs (all labs ordered are listed, but only abnormal results are displayed) Labs Reviewed  BASIC METABOLIC PANEL - Abnormal; Notable for the following components:      Result Value   Potassium 3.3 (*)    CO2 21 (*)    Glucose, Bld 111 (*)    All other components within normal limits  CBC - Abnormal; Notable for the following components:   MCH 25.8 (*)    RDW 15.6 (*)    All other components within normal limits  TROPONIN I  I-STAT TROPONIN, ED    EKG EKG Interpretation  Date/Time:  Saturday June 24 2018 08:07:07 EDT Ventricular Rate:  79 PR Interval:  148 QRS Duration: 81 QT Interval:  402 QTC Calculation: 461 R Axis:   68 Text Interpretation:  Sinus rhythm Confirmed by Quintella Reichert [phone]) on 06/24/2018 8:32:55 AM Also confirmed by Quintella Reichert [phone]), editor Lynder Parents [phone])  on 06/24/2018 10:37:11 AM   Radiology Dg Chest 2 View  Result Date: 06/24/2018 CLINICAL DATA:  Chest pain off and on for a while worse last night, states she has been going in and out of atrial fibrillation since 0030 hours. History hypertension, former smoker, asthma, GERD EXAM: CHEST - 2 VIEW COMPARISON:  06/03/2018 FINDINGS: Normal heart size, mediastinal contours, and pulmonary vascularity. Lungs mildly hyperinflated but clear. No infiltrate, pleural effusion or pneumothorax. Atherosclerotic calcification aorta. No acute osseous findings. IMPRESSION: Mildly hyperinflated lungs without acute infiltrate. Electronically Signed   By: Lavonia Dana M.D.   On: 06/24/2018 08:13    Procedures Procedures (including critical care time)  Medications Ordered in ED Medications  acetaminophen (TYLENOL) tablet 650 mg (650 mg Oral Given  06/24/18 0908)     Initial Impression / Assessment and Plan / ED Course  I have reviewed the triage vital signs and  the nursing notes.  Pertinent labs & imaging results that were available during my care of the patient were reviewed by me and considered in my medical decision making (see chart for details).  Clinical Course as of Jun 24 1238  Sat Jun 24, 2018  1232 Patient reevaluated, resting comfortably no acute distress.  Denies recurrence of palpitations or chest tightness during emergency room visit today.   [BM]    Clinical Course User Index [BM] Deliah Boston, PA-C   67 year old female with history of proximal atrial fibrillation presenting today for concern of palpitations that began last night.  Patient states that she woke with a start last night when her neighbors upstairs dropped a loud object.  Patient then took diltiazem 50 mg x 3 before presenting to the emergency department for continuing palpitations and chest tightness.  Both chest tightness and palpitations resolved prior to evaluation today and have not recurred during her visit. When asked patient attributes her prior chest tightness to feeling of anxiety about having taken her diltiazem too often and was concerned about overdose. - Patient without bradycardia or hypotension during visit today, 150mg  total diltiazem taken, discussed with Dr. Ralene Bathe, do not suspect diltiazem overdose at this time. Patient reassured and informed only to take her medications as prescribed.  Troponin negative x2 EKG without acute changes reviewed by Dr. Ralene Bathe x2 Chest x-ray without acute findings CBC nonacute BMP nonacute Patient is on Xarelto, denies missing dosage, without signs or symptoms of DVT. Do not suspect pulmonary embolism.  Patient is afebrile, not tachycardic, not hypotensive, not tachypneic and 98% on room air.  Resting comfortably, no acute distress.  Patient denying any and all pain throughout evaluation today.  Patient's case discussed with and EKG/labs reviewed with Dr. Ralene Bathe who agrees with discharge and outpatient cardiology  follow-up at this time.  Patient states that she has an appointment with her cardiologist this coming Monday, 2 days from now.  At this time there does not appear to be any evidence of an acute emergency medical condition and the patient appears stable for discharge with appropriate outpatient follow up. Diagnosis was discussed with patient who verbalizes understanding of care plan and is agreeable to discharge. I have discussed return precautions with patient who verbalizes understanding of return precautions. Patient strongly encouraged to follow-up with their Cardiologist appointment on Monday. All questions answered.   Note: Portions of this report may have been transcribed using voice recognition software. Every effort was made to ensure accuracy; however, inadvertent computerized transcription errors may still be present.  Final Clinical Impressions(s) / ED Diagnoses   Final diagnoses:  Palpitations    ED Discharge Orders    None       Deliah Boston, PA-C 06/24/18 1249    Gari Crown 06/24/18 1300    Quintella Reichert, MD 06/25/18 [phone]

## 2018-06-24 NOTE — Discharge Instructions (Addendum)
Please return to the Emergency Department for any new or worsening symptoms or if your symptoms do not improve. Please be sure to follow up with your Primary Care Physician as soon as possible regarding your visit today. If you do not have a Primary Doctor please use the resources below to establish one. Please only take your diltiazem as prescribed by your cardiologist.  Please schedule a follow-up appointment with your cardiologist for as soon as possible.  Contact a doctor if: You notice a change in the speed, rhythm, or strength of your heartbeat. You are taking a blood-thinning medicine and you notice more bruising. You get tired more easily when you move or exercise. Get help right away if: You have pain in your chest or your belly (abdomen). You have sweating or weakness. You feel sick to your stomach (nauseous). You notice blood in your throw up (vomit), poop (stool), or pee (urine). You are short of breath. You suddenly have swollen feet and ankles. You feel dizzy. Your suddenly get weak or numb in your face, arms, or legs, especially if it happens on one side of your body. You have trouble talking, trouble understanding, or both. Your face or your eyelid droops on one side. Contact a doctor if: Your heartbeat is still fast or uneven after 24 hours. Your palpitations occur more often. Get help right away if: You have chest pain. You feel short of breath. You have a very bad headache. You feel dizzy. You pass out (faint).   Do not take your medicine if  develop an itchy rash, swelling in your mouth or lips, or difficulty breathing.   RESOURCE GUIDE  Chronic Pain Problems: Contact [facility]  [phone] Patients need to be referred by their primary care doctor.  Insufficient Money for Medicine: Contact [employer]:  call "211" or [place] [phone].  No Primary Care Doctor: Call [facility]  [phone] - can help you locate a primary  care doctor that  accepts your insurance, provides certain services, etc. Physician Referral Service- [phone]  Agencies that provide inexpensive medical care: [facility]  [facility]  [phone] [facility]  [phone] [facility]  [phone] [facility]  [phone] [facility]  [phone]  [place] Providers: [facility]- [address], [address]  [phone], Mon-Fri 9am-7pm, Sat 9am-1pm [place], [address] [place]  [place], [address] [place]  [place]- [address]  [place], [address], [phone]  Only accepts [place] patients after they have their name  applied to their card  Self Pay (no insurance) in [facility]: Sickle Cell Patients: Dr Kevan Ny, [facility]  [place], [facility]- [place]  [place] [facility] [place]- [address], [facility]- see information above (Speak to [employer] if you do not have insurance)       -  [facility]- [place], [place] [address]  [place]  Primary Care- [address]  [address] Vista Lawman-  [address], [address], [place], [place] [facility]- [address] [phone]       -  [facility]- [address], [place], also [address], [phone]       -    [facility]- [address], [place], 1st & 3rd Saturday   every month, 10am-1pm  1) Find a Doctor and Pay Out of Pocket Although you won't have to find out who is covered by your insurance plan, it is a good idea to  ask around and get recommendations. You will then need to call the office and see if the doctor you have chosen will accept you as a new patient and what types of options they offer for patients who are self-pay. Some doctors offer discounts or will set up payment plans for their patients who do not have insurance, but you will need to ask so you aren't surprised when you get to your appointment.  2) Contact Your Local Health Department Not all health departments have doctors that can see patients for sick visits, but many do, so it is worth a call to see if yours does. If you don't know where your local health department is, you can check in your phone book. The CDC also has a tool to help you locate your state's health department, and many state websites also have listings of all of their local health departments.  3) Find a [place] If your illness is not likely to be very severe or complicated, you may want to try a walk in clinic. These are popping up all over the country in pharmacies, drugstores, and shopping centers. They're usually staffed by nurse practitioners or physician assistants that have been trained to treat common illnesses and complaints. They're usually fairly quick and inexpensive. However, if you have serious medical issues or chronic medical problems, these are probably not your best option  [facility] [place], [facility], [address], [place], phone [phone] or [phone].  Monday - Friday, call for an appointment. [place], [facility], [address], [place], phone [phone] or [phone].  Monday - Friday, call for an appointment.  Abuse/Neglect: [place] [phone] [place] [phone] (After Hours)  Emergency Shelter:  [facility] [phone]  Maternity Homes: Room at the  [place] [phone] [place] [phone]  MRSA Hotline #:   862-417-9372  [facility] Dept. [address]         [place] Phone:  [phone]                                  Phone:  [phone]                   Phone:  [place], [place]- [phone]       -     [facility] in [place], [address],                                  [place] [phone] or [phone] (After Hours)   [place]  Substance Abuse Resources: Alcohol and Drug Services  [phone] [place] [phone] [facility] [place] Chinita Pester [phone] Residential & Outpatient Substance Abuse Program  [phone]  Psychological Services: [place]  [phone] [place]  [place], [place]. [address], [place], [place]: [phone] or [phone], [employer]  Dental Assistance  If unable to pay or uninsured, contact:  [facility] or [facility]. to become qualified for the adult dental clinic.  Patients with Medicaid: [facility] [phone] [address], [place] [address], [phone]  If unable to pay, or uninsured, contact [facility] [phone]) or [place] [phone] in [place], [place] in [facility]) to become qualified for the adult dental clinic   Other [place]- [place], [place], [place], [address], [place], [place], 2nd and 4th Thursday of the month at 6:30am.  10 clients each day by appointment, can sometimes see walk-in patients if someone does not show for an appointment. [facility]- [address] Hillard Danker [place], [place], [address], [place], [place], [place], [address], [place] Department- [phone] [place] [facility] [phone]

## 2018-06-24 NOTE — ED Notes (Signed)
Patient transported to X-ray 

## 2018-06-24 NOTE — ED Notes (Signed)
Pt stable, ambulatory, and verbalizes understanding of discharge instructions.

## 2018-06-24 NOTE — Care Management (Signed)
Patient noted to have had 9 ED visits in the past 6 months for similar complaints. ED CM met with patient at bedside to discuss benefits of primary care or identify any  barriers from accessing primary care.  Patient reports last PCP visit was 3 weeks ago, and verified access to private transportation. Discussed levels to access appropriate care and provided written resources, also explained that after an ED visit patient should make a f/u appointment with PCP patient verbalized understanding. Patient inquired about assistance with obtain ensure supplements, contacted [facility] concerning this question will contact patient with information by Monday  11/4.

## 2018-06-24 NOTE — ED Triage Notes (Signed)
Pt states she has had chest pain off and on "for a while" but last night it became worse. States she has been going in and out of afib. Pt crying. Pain currently 10/10 to centralized chest.

## 2018-06-24 NOTE — ED Notes (Signed)
Pt ambulated to restroom with steady gait.

## 2018-06-26 ENCOUNTER — Ambulatory Visit (HOSPITAL_COMMUNITY)
Admission: RE | Admit: 2018-06-26 | Discharge: 2018-06-26 | Disposition: A | Discharge status: Home / Self Care | Payer: Medicare Other | Source: Ambulatory Visit | Attending: Nurse Practitioner | Admitting: Nurse Practitioner

## 2018-06-26 ENCOUNTER — Encounter (HOSPITAL_COMMUNITY): Payer: Self-pay | Admitting: Nurse Practitioner

## 2018-06-26 VITALS — BP 136/88 | HR 84 | Ht 67.0 in | Wt 153.0 lb

## 2018-06-26 DIAGNOSIS — Z79899 Other long term (current) drug therapy: Secondary | ICD-10-CM | POA: Insufficient documentation

## 2018-06-26 DIAGNOSIS — I480 Paroxysmal atrial fibrillation: Secondary | ICD-10-CM | POA: Insufficient documentation

## 2018-06-26 DIAGNOSIS — I10 Essential (primary) hypertension: Secondary | ICD-10-CM | POA: Insufficient documentation

## 2018-06-26 DIAGNOSIS — Z87891 Personal history of nicotine dependence: Secondary | ICD-10-CM | POA: Diagnosis not present

## 2018-06-26 DIAGNOSIS — K219 Gastro-esophageal reflux disease without esophagitis: Secondary | ICD-10-CM | POA: Diagnosis not present

## 2018-06-26 DIAGNOSIS — J45909 Unspecified asthma, uncomplicated: Secondary | ICD-10-CM | POA: Diagnosis not present

## 2018-06-26 DIAGNOSIS — Z7901 Long term (current) use of anticoagulants: Secondary | ICD-10-CM | POA: Diagnosis not present

## 2018-06-26 LAB — BASIC METABOLIC PANEL
Anion gap: 3 — ABNORMAL LOW (ref 5–15)
BUN: 10 mg/dL (ref 8–23)
CALCIUM: 10 mg/dL (ref 8.9–10.3)
CHLORIDE: 109 mmol/L (ref 98–111)
CO2: 27 mmol/L (ref 22–32)
Creatinine, Ser: 0.73 mg/dL (ref 0.44–1.00)
GFR calc non Af Amer: >60 mL/min (ref >60)
Glucose, Bld: 98 mg/dL (ref 70–99)
Potassium: 4 mmol/L (ref 3.5–5.1)
SODIUM: 139 mmol/L (ref 135–145)

## 2018-06-26 NOTE — Progress Notes (Signed)
Primary Care Physician: [name], MD Referring Physician: [facility]    [name] is a 67 y.o. female with a h/o afib, s/p ablation and prior flecainide use. She has been doing very well since her ablation without any afib awareness but was in the ER this week end for palpitations.She states that she was awoke with a loud nose and it scared her and was aware of a rapid heart beat. She took a 30 mg cardizem but her heart rate would not settle down so she came to the  ER. Ekg showed sinus tach at 103 bpm, No afib was documented by monitor or EKG. She was d/c without any change to meds. She has not had any other problems.  Today, she denies symptoms of palpitations, chest pain, shortness of breath, orthopnea, PND, lower extremity edema, dizziness, presyncope, syncope, or neurologic sequela. The patient is tolerating medications without difficulties and is otherwise without complaint today.   Past Medical History:  Diagnosis Date  . A-fib ([place])   . Allergy    seasonal  . Asthma   . Chronic anticoagulation   . Eczema   . GERD (gastroesophageal reflux disease)   . Hypertension   . PAF (paroxysmal atrial fibrillation) ([place]) 11/26/2015   April/May 2017: Multiple ED visits for A. Fib.  02/12/16: Improved on diltiazem IV. Cardiology consulted in the ED and discharge patient with diltiazem XR 120 mg once daily with 60 mg by mouth every 6 hours as needed for symptomatic palpitations, flecainide 50 mg twice daily. Referred to the atrial fibrillation clinic.  02/17/16: Seen in the atrial fibrillation clinic by NP Roderic Palau. Normal sin   Past Surgical History:  Procedure Laterality Date  . ATRIAL FIBRILLATION ABLATION N/A 09/30/2017   Procedure: ATRIAL FIBRILLATION ABLATION;  Surgeon: Thompson Grayer, MD;  Location: [facility];  Service: Cardiovascular;  Laterality: N/A;  . TONSILLECTOMY    . TUBAL LIGATION      Current Outpatient Medications  Medication Sig Dispense Refill  .  diclofenac sodium (VOLTAREN) 1 % GEL Apply 2 g topically 4 (four) times daily. 100 g 3  . dicyclomine (BENTYL) 20 MG tablet Take 1 tablet (20 mg total) by mouth 2 (two) times daily. 20 tablet 0  . diltiazem (CARDIZEM) 60 MG tablet TAKE 1 TABLET BY MOUTH EVERY 6 HOURS AS NEEDED FOR RAPID AFIB HEART RATE OVER 100 (Patient taking differently: Take 60 mg by mouth every 6 (six) hours as needed (rapid afib heart rate over 100.). ) 45 tablet 2  . diltiazem (CARTIA XT) 120 MG 24 hr capsule TAKE 1 CAPSULE(120 MG) BY MOUTH EVERY EVENING (Patient taking differently: Take 120 mg by mouth every evening. TAKE 1 CAPSULE(120 MG) BY MOUTH EVERY EVENING) 90 capsule 3  . esomeprazole (NEXIUM) 20 MG capsule Take 2 capsules (40 mg total) by mouth daily. (Patient taking differently: Take 20 mg by mouth daily. ) 180 capsule 1  . FLOVENT HFA 44 MCG/ACT inhaler Inhale 1 puff into the lungs 2 (two) times daily.  12  . levalbuterol (XOPENEX HFA) 45 MCG/ACT inhaler Inhale 1 puff into the lungs every 6 (six) hours as needed for shortness of breath. 1 Inhaler 12  . levocetirizine (XYZAL) 5 MG tablet Take 1 tablet (5 mg total) by mouth every evening. 30 tablet 0  . losartan (COZAAR) 50 MG tablet Take 50 mg by mouth daily.    . magnesium hydroxide (MILK OF MAGNESIA) 400 MG/5ML suspension Take 15-30 mLs by mouth at  bedtime as needed for mild constipation. 360 mL 0  . montelukast (SINGULAIR) 10 MG tablet Take 1 tablet (10 mg total) by mouth at bedtime. 90 tablet 1  . Multiple Vitamin (MULTI-VITAMIN PO) Take 1 tablet by mouth daily. **Women Day 60+**    . polyethylene glycol powder (GLYCOLAX/MIRALAX) powder Mix 17g of Miralax in 8 ounces of water every other day (Patient taking differently: Take 17 g by mouth as needed for mild constipation. ) 255 g 0  . potassium chloride (K-DUR,KLOR-CON) 10 MEQ tablet Take 1 tablet (10 mEq total) by mouth 2 (two) times daily. 60 tablet 6  . rivaroxaban (XARELTO) 20 MG TABS tablet Take 1 tablet (20 mg  total) by mouth daily with supper. 30 tablet 6  . spironolactone (ALDACTONE) 25 MG tablet Take 1 tablet (25 mg total) by mouth daily. 30 tablet 1  . sucralfate (CARAFATE) 1 GM/10ML suspension Take 10 mLs (1 g total) by mouth 4 (four) times daily. 420 mL 1  . tiotropium (SPIRIVA) 18 MCG inhalation capsule Place 1 capsule (18 mcg total) into inhaler and inhale daily. 30 capsule 2  . triamcinolone cream (KENALOG) 0.1 % Apply 1 application topically 2 (two) times daily. 30 g 0  . linaclotide (LINZESS) 145 MCG CAPS capsule Take 1 capsule (145 mcg total) by mouth daily before breakfast. (Patient not taking: Reported on 06/03/2018) 10 capsule 0  . simethicone (GAS-X) 80 MG chewable tablet Chew 1 tablet (80 mg total) by mouth every 6 (six) hours as needed for flatulence. (Patient not taking: Reported on 06/26/2018) 30 tablet 0   No current facility-administered medications for this encounter.     Allergies  Allergen Reactions  . Albuterol Other (See Comments)    Caused afib   . Celecoxib Rash  . Protonix [Pantoprazole Sodium] Palpitations    Social History   Socioeconomic History  . Marital status: Single    Spouse name: Not on file  . Number of children: Not on file  . Years of education: Not on file  . Highest education level: Not on file  Occupational History  . Occupation: Retired  Scientific laboratory technician  . Financial resource strain: Not on file  . Food insecurity:    Worry: Not on file    Inability: Not on file  . Transportation needs:    Medical: Not on file    Non-medical: Not on file  Tobacco Use  . Smoking status: Former Smoker    Last attempt to quit: 10/24/2015    Years since quitting: 2.6  . Smokeless tobacco: Never Used  Substance and Sexual Activity  . Alcohol use: No  . Drug use: No    Types: Marijuana    Comment: Smoked marijuana in the past.  . Sexual activity: Yes    Partners: Male    Birth control/protection: Post-menopausal  Lifestyle  . Physical activity:    Days  per week: Not on file    Minutes per session: Not on file  . Stress: Not on file  Relationships  . Social connections:    Talks on phone: Not on file    Gets together: Not on file    Attends religious service: Not on file    Active member of club or organization: Not on file    Attends meetings of clubs or organizations: Not on file    Relationship status: Not on file  . Intimate partner violence:    Fear of current or ex partner: Not on file    Emotionally  abused: Not on file    Physically abused: Not on file    Forced sexual activity: Not on file  Other Topics Concern  . Not on file  Social History Narrative   Lives alone in [place], [place] and has 2 daughters who are within driving distance   Enjoys watching soap operas, reading, exercise    Family History  Problem Relation Age of Onset  . Heart attack Mother        [name] age  . Breast cancer Sister        Late 53s; now s/p mastectomy   . Lung cancer Brother        x 2  . Cancer Sister   . Cancer Sister   . Cancer Sister   . Colon cancer Neg Hx   . Colon polyps Neg Hx   . Esophageal cancer Neg Hx   . Rectal cancer Neg Hx   . Stomach cancer Neg Hx     ROS- All systems are reviewed and negative except as per the HPI above  Physical Exam: Vitals:   06/26/18 1032  BP: 136/88  Pulse: 84  Weight: 69.4 kg  Height: 5\' 7"  (1.702 m)   Wt Readings from Last 3 Encounters:  06/26/18 69.4 kg  06/13/18 70.1 kg  05/15/18 72.3 kg    Labs: Lab Results  Component Value Date   NA 139 06/26/2018   K 4.0 06/26/2018   CL 109 06/26/2018   CO2 27 06/26/2018   GLUCOSE 98 06/26/2018   BUN 10 06/26/2018   CREATININE 0.73 06/26/2018   CALCIUM 10.0 06/26/2018   PHOS 3.0 04/15/2017   MG 2.1 09/25/2016   Lab Results  Component Value Date   INR 1.03 10/06/2016   Lab Results  Component Value Date   CHOL 245 (H) 03/17/2016   HDL 94 03/17/2016   LDLCALC 132 (H) 03/17/2016   TRIG 95 03/17/2016     GEN- The patient is  well appearing, alert and oriented x 3 today.   Head- normocephalic, atraumatic Eyes-  Sclera clear, conjunctiva pink Ears- hearing intact Oropharynx- clear Neck- supple, no JVP Lymph- no cervical lymphadenopathy Lungs- Clear to ausculation bilaterally, normal work of breathing Heart- Regular rate and rhythm, no murmurs, rubs or gallops, PMI not laterally displaced GI- soft, NT, ND, + BS Extremities- no clubbing, cyanosis, or edema MS- no significant deformity or atrophy Skin- no rash or lesion Psych- euthymic mood, full affect Neuro- strength and sensation are intact  EKG-NSR at 84 bpm, pr int 120 ms, qrs int 82 ms, qtc 432 ms Epic records  Reviewed ER labs and EKG's reviewed    Assessment and Plan: 1. Paroxysmal afib  Pt has done well since ablation and is off flecainide I am not sure that she had afib this past weekend as pt is very anxious re heart rhymes and may have just been S tach Reviewed with pt again when it is appropriative  to go to ER and when she can treat at home.  Will continue to use cardem 30 mg as needed and diltiazem 120 mg daily at home She did have a low K+ at 3.3 in the ER and will recheck bmet today  2. CHA2DS2VASc score of at least 3 Continue xarelto 20 mg daily  F/u with Dr. Rayann Heman 09/25/2018  Geroge Baseman. Demontrae Gilbert, [facility] [address] [place], [place] [address] [phone]

## 2018-06-27 ENCOUNTER — Ambulatory Visit: Payer: Self-pay

## 2018-06-30 ENCOUNTER — Telehealth: Payer: Self-pay | Admitting: Internal Medicine

## 2018-06-30 ENCOUNTER — Other Ambulatory Visit: Payer: Self-pay

## 2018-06-30 ENCOUNTER — Ambulatory Visit (INDEPENDENT_AMBULATORY_CARE_PROVIDER_SITE_OTHER): Payer: Medicare Other | Admitting: Internal Medicine

## 2018-06-30 DIAGNOSIS — Z79899 Other long term (current) drug therapy: Secondary | ICD-10-CM | POA: Diagnosis not present

## 2018-06-30 DIAGNOSIS — J329 Chronic sinusitis, unspecified: Secondary | ICD-10-CM | POA: Diagnosis not present

## 2018-06-30 DIAGNOSIS — J302 Other seasonal allergic rhinitis: Secondary | ICD-10-CM | POA: Diagnosis not present

## 2018-06-30 DIAGNOSIS — R0981 Nasal congestion: Secondary | ICD-10-CM

## 2018-06-30 MED ORDER — AMOXICILLIN 500 MG PO TABS: 500.0000 mg | ORAL_TABLET | Freq: Three times a day (TID) | ORAL | 0 refills | Status: AC

## 2018-06-30 NOTE — Patient Instructions (Signed)
Thank you for coming to the clinic today. It was a pleasure to see you.   For your cold, I have prescribed an antibiotic you will take this three times daily for 5 days   FOLLOW-UP INSTRUCTIONS When: 1 month with Dr. Reesa Chew  For: follow up of your blood pressure  What to bring: all of your medication bottles   Please call the internal medicine center clinic if you have any questions or concerns, we may be able to help and keep you from a long and expensive emergency room wait. Our clinic and after hours phone number is [phone], the best time to call is Monday through Friday 9 am to 4 pm but there is always someone available 24/7 if you have an emergency. If you need medication refills please notify your pharmacy one week in advance and they will send [name] a request.   You have a viral infection of your sinuses and upper respiratory tract. Unfortunately, we do not have any good antibiotics to treat this infection. However, it should resolve on its own in about 10 days.  In the meantime, there are several good remedies that will treat your symptoms and hopefully help to make you more comfortable. Please look for one of the over-the-counter medications below that will help with your congestion, cough, and post-nasal drip. Additionally, it will be very important to use nasal irrigation and nasal steroid spray to clear you sinus congestion. I have provided a recipe and instructions below.     BUFFERED ISOTONIC SALINE NASAL IRRIGATION  The Benefits:  1. When you irrigate, the isotonic saline (salt water) acts as a solvent and washes the mucus crusts and other debris from your nose.  2. This decongests and improves the airflow into your nose. The sinus passages begin to open.   Over the counter:  I recommend a Squirt Bottle form of saline to provide an adequate volume of irrigation to clear your sinuses.   Home Recipe:  1. Choose a 1-quart glass jar that is thoroughly  cleansed.  2. Fill with sterile or distilled water, or you can boil water from the tap.  3. Add 1 to 2 heaping teaspoons of "pickling/canning/sea" salt (NOT table salt as it contains a large number of additives). This salt is available at the grocery store in the food canning section.  4. Mix ingredients together and store at room temperature. Discard after one week. If you find this solution too strong, you may decrease the amount of salt added to 1 to 1  teaspoons. With children it is often best to start with a milder solution and advance slowly. Irrigate with 240 ml (8 oz) twice daily.  The Instructions:  You should plan to irrigate your nose with buffered isotonic saline 2 times per day. Many people prefer to warm the solution slightly in the microwave - but be sure that the solution is NOT HOT. Stand over the sink (some do this in the shower) and squirt the solution into each side of your nose, keeping your mouth open. This allows you to spit the saltwater out of your mouth. It will not harm you if you swallow a little.  If you have been told to use a nasal steroid such as Flonase, Nasonex, or Nasacort, you should always use isortonic saline solution first, then use your nasal steroid product. The nasal steroid is much more effective when sprayed onto clean nasal membranes and the steroid medicine will reach deeper into the nose.  Most  people experience a little burning sensation the first few times they use a isotonic saline solution, but this usually goes away within a few days.

## 2018-06-30 NOTE — Assessment & Plan Note (Signed)
She has a throbbing headache, rhinorrhea for the past month. She denies cough, sore throat, fevers or chills. She describes taking a daily sinus medication - ceterizine 10 mg daily and tylenol every day and found no relief from this. She would like counseling on how to perform sinus irrigations and request antibiotic therapy.  Given the ongoing symptoms for over two weeks I will provide antibiotics but feel that this is likely related to her chronic allergic rhinitis with overlying symptoms of viral URI and explained that she would benefit from better prophylaxis of the allergic rhinitis  - Start trial of sinus irrigation  -  Prescribed amoxicillin for 5 day course  - discussed the importance of starting fluticasone spray for better control of her seasonal allergies, she declines starting fluticasone today but says that she will think about this for her next follow up

## 2018-06-30 NOTE — Telephone Encounter (Signed)
Patient states took her medicine, spironolactone (ALDACTONE) 25 MG tablet, and it caused her heart to flutter.  States she is going to stop taking it and would like a call back.

## 2018-06-30 NOTE — Telephone Encounter (Signed)
Called pt - stated Aldactone is causing her heart to flutter; stated she start taking it x 1 week. Please advise Thanks

## 2018-06-30 NOTE — Progress Notes (Signed)
   CC: follow up of sinus pressure   HPI:  [name] is a 67 y.o. with PMH as listed below who presents for sinus pressure. Please see the assessment and plans for the status of the patient chronic medical problems.    Past Medical History:  Diagnosis Date  . A-fib ([place])   . Allergy    seasonal  . Asthma   . Chronic anticoagulation   . Eczema   . GERD (gastroesophageal reflux disease)   . Hypertension   . PAF (paroxysmal atrial fibrillation) ([place]) 11/26/2015   April/May 2017: Multiple ED visits for A. Fib.  02/12/16: Improved on diltiazem IV. Cardiology consulted in the ED and discharge patient with diltiazem XR 120 mg once daily with 60 mg by mouth every 6 hours as needed for symptomatic palpitations, flecainide 50 mg twice daily. Referred to the atrial fibrillation clinic.  02/17/16: Seen in the atrial fibrillation clinic by NP Roderic Palau. Normal sin   Review of Systems:  Refer to history of present illness and assessment and plans for pertinent review of systems, all others reviewed and negative  Physical Exam:  Vitals:   06/30/18 0936 06/30/18 1105  BP: (!) 150/94 (!) 168/100  Pulse: 80 78  Temp: (!) 97.5 F (36.4 C)   TempSrc: Oral   SpO2: 97% 99%  Weight: 155 lb 3.2 oz (70.4 kg)   Height: 5\' 7"  (1.702 m)    General: well appearing, no acute distress  HEENT: bilateral ear canals are clear of erythema, no effusions behind the tympanic membranes, boggy nasal mucosa, no tonsillar exudate or erythema, no cervical lymphadenopathy, no tenderness of the frontal sinuses, there is tenderness of bilateral maxillary sinuses   Assessment & Plan:   Sinusitis  She has a throbbing headache, rhinorrhea for the past month. She denies cough, sore throat, fevers or chills. She describes taking a daily sinus medication - ceterizine 10 mg daily and tylenol every day and found no relief from this. She would like counseling on how to perform sinus irrigations and request antibiotic  therapy.  Given the ongoing symptoms for over two weeks I will provide antibiotics but feel that this is likely related to her chronic allergic rhinitis with overlying symptoms of viral URI and explained that she would benefit from better prophylaxis of the allergic rhinitis  - Start trial of sinus irrigation  -  Prescribed amoxicillin for 5 day course  - discussed the importance of starting fluticasone spray for better control of her seasonal allergies, she declines starting fluticasone today but says that she will think about this for her next follow up   See Encounters Tab for problem based charting.  Patient discussed with Dr. Angelia Mould

## 2018-07-03 NOTE — Progress Notes (Signed)
Internal Medicine Clinic Attending  Case discussed with Dr. Blum at the time of the visit.  We reviewed the resident's history and exam and pertinent patient test results.  I agree with the assessment, diagnosis, and plan of care documented in the resident's note. 

## 2018-07-03 NOTE — Telephone Encounter (Signed)
Thank you for letting me know

## 2018-07-03 NOTE — Telephone Encounter (Signed)
Pt called back / talked to front office - stated she's "fine" and did not want to schedule an appt now. Scheduled an appt 10/17/18 with Dr Reesa Chew.

## 2018-07-03 NOTE — Telephone Encounter (Signed)
Called pt - no answer; left hipaa compliant vm on self-identified machine to call and schedule an appt to be eval for c/o heart flutter d/t spironolactone.

## 2018-07-03 NOTE — Telephone Encounter (Signed)
Please ask patient to follow up in [facility] so we can evaluate her

## 2018-07-03 NOTE — Telephone Encounter (Signed)
Thank you :)

## 2018-07-12 ENCOUNTER — Other Ambulatory Visit (HOSPITAL_COMMUNITY): Payer: Self-pay | Admitting: Nurse Practitioner

## 2018-07-25 ENCOUNTER — Other Ambulatory Visit: Payer: Self-pay | Admitting: *Deleted

## 2018-07-25 NOTE — Patient Outreach (Signed)
[facility]) Care Management  07/25/2018  [name] 03/19/1951 [id number]  Covering for assigned Health Coach, Joaquim Lai [place]:  RNCM called to follow up. No answer. HIPPA compliant message left.  Plan: Health Coach will continue to follow.  Covering RNCM for [employer] [place]: Thea Silversmith, RN, MSN, [place] Coordinator Cell: [phone]

## 2018-08-03 IMAGING — CR DG CHEST 2V
2 series · 2 of 2 positions shown · non-contrast
Comparison: 02/13/2017

CLINICAL DATA: Atrial fibrillation

EXAM:
CHEST  2 VIEW

[chest pa]
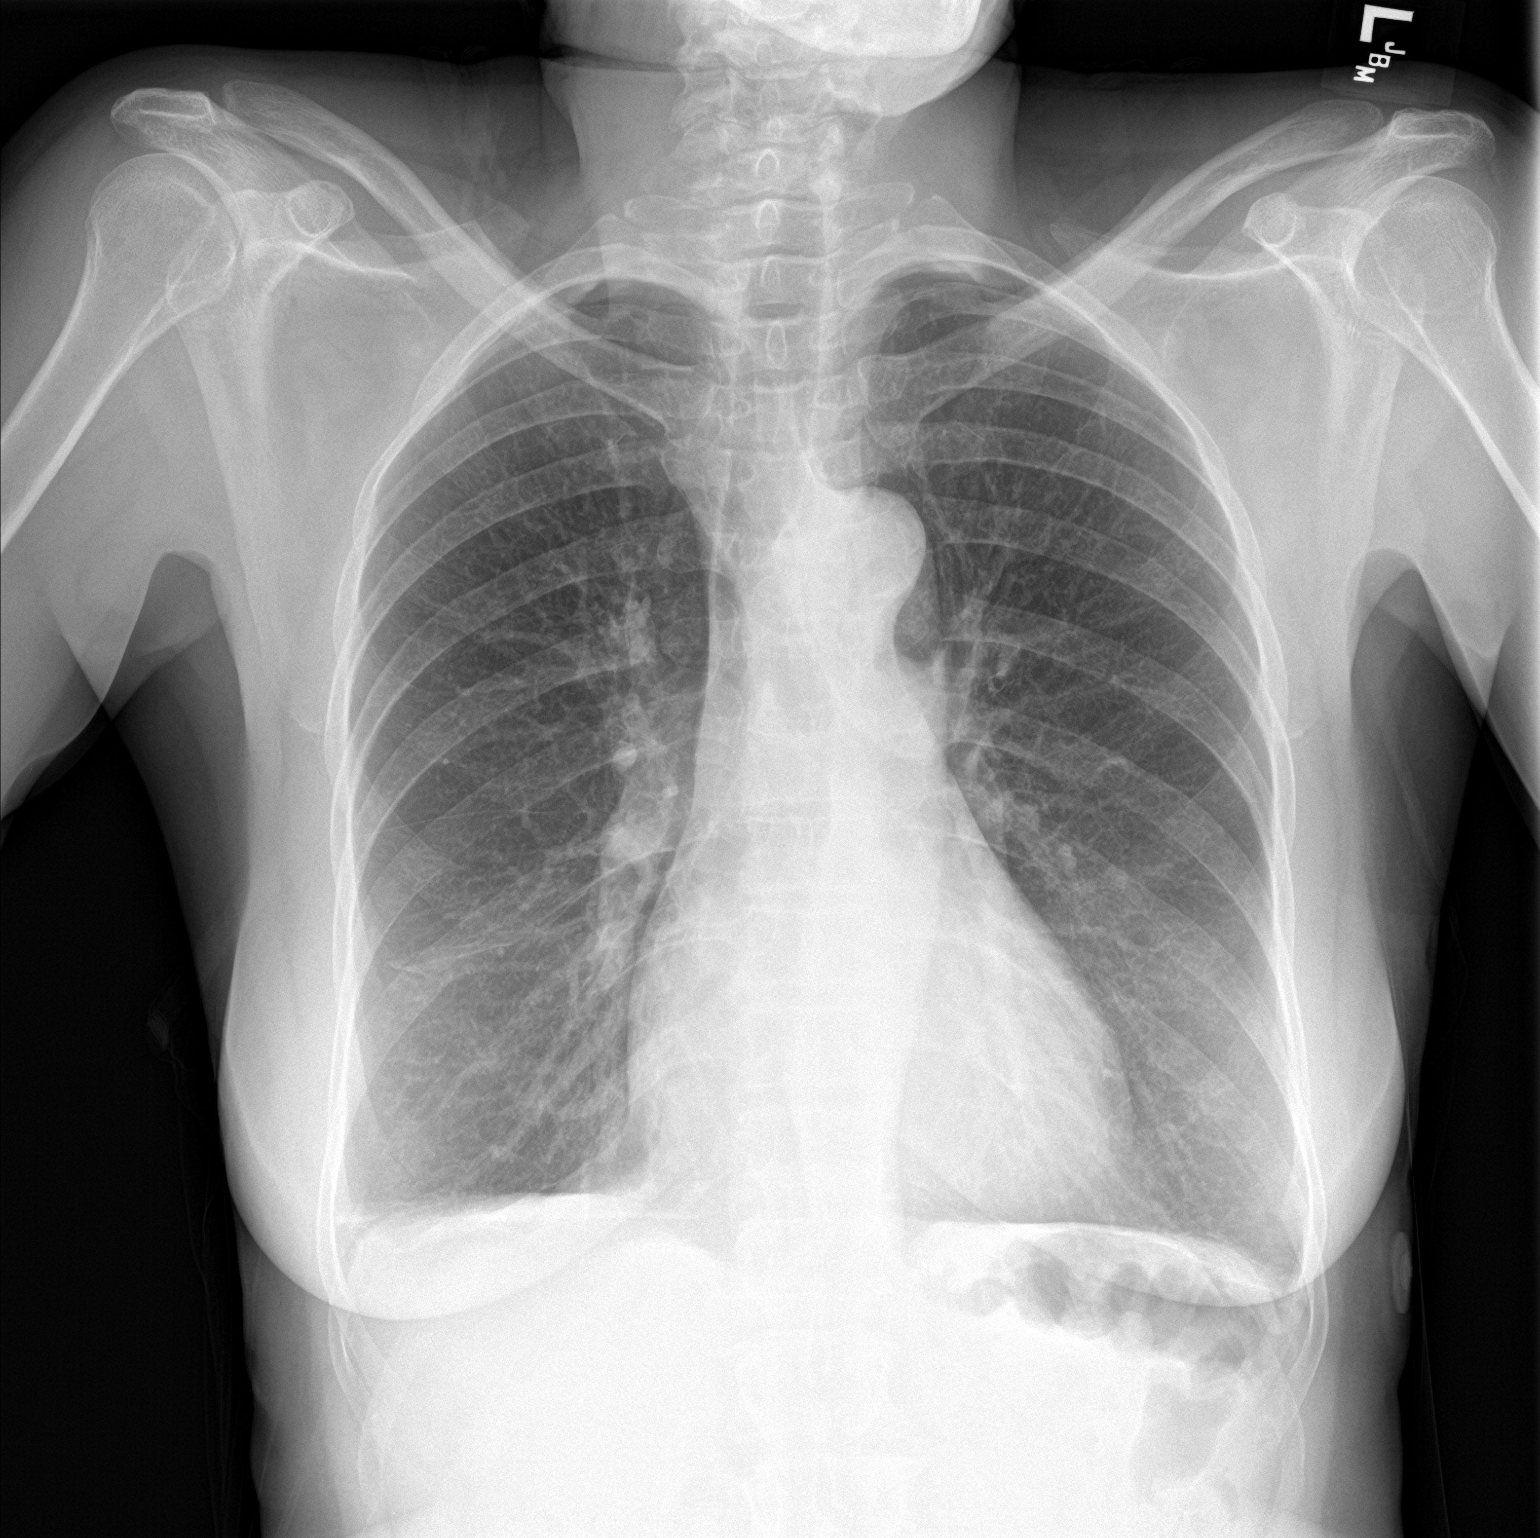

[chest lat]
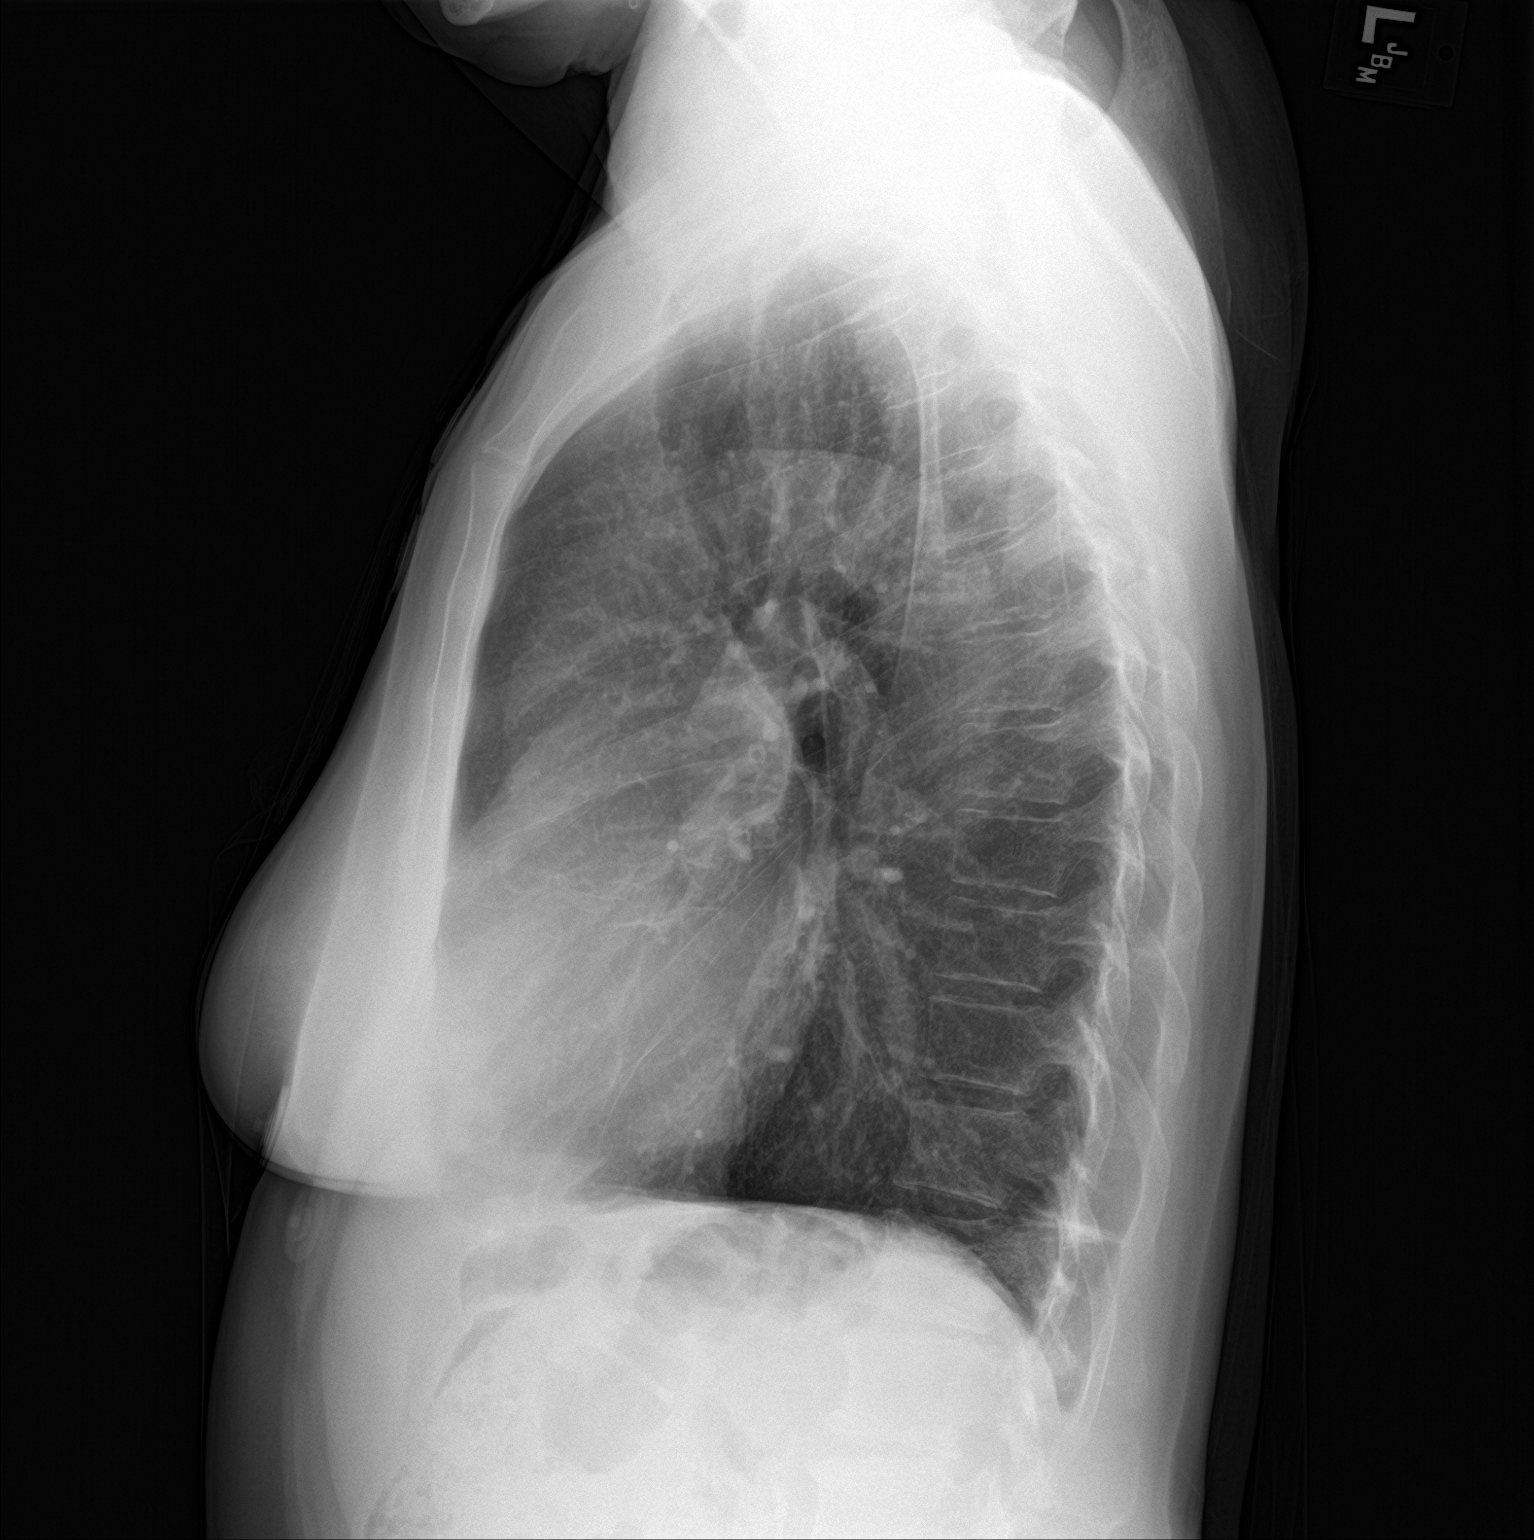

[2 of 2 positions shown; findings below may reference images not displayed]

FINDINGS: Mild scarring in the right lower lung. Mild right basilar
scarring/atelectasis. No focal consolidation. No pleural effusion or
pneumothorax.

The heart is normal in size.

Visualized osseous structures are within normal limits.
IMPRESSION: No active cardiopulmonary disease.

## 2018-08-10 IMAGING — CR DG CHEST 2V
2 series · 2 of 2 positions shown · non-contrast
Comparison: None.

CLINICAL DATA: Atrial fibrillation.  Allergic reaction to hair dye.

EXAM:
CHEST  2 VIEW

[chest pa]
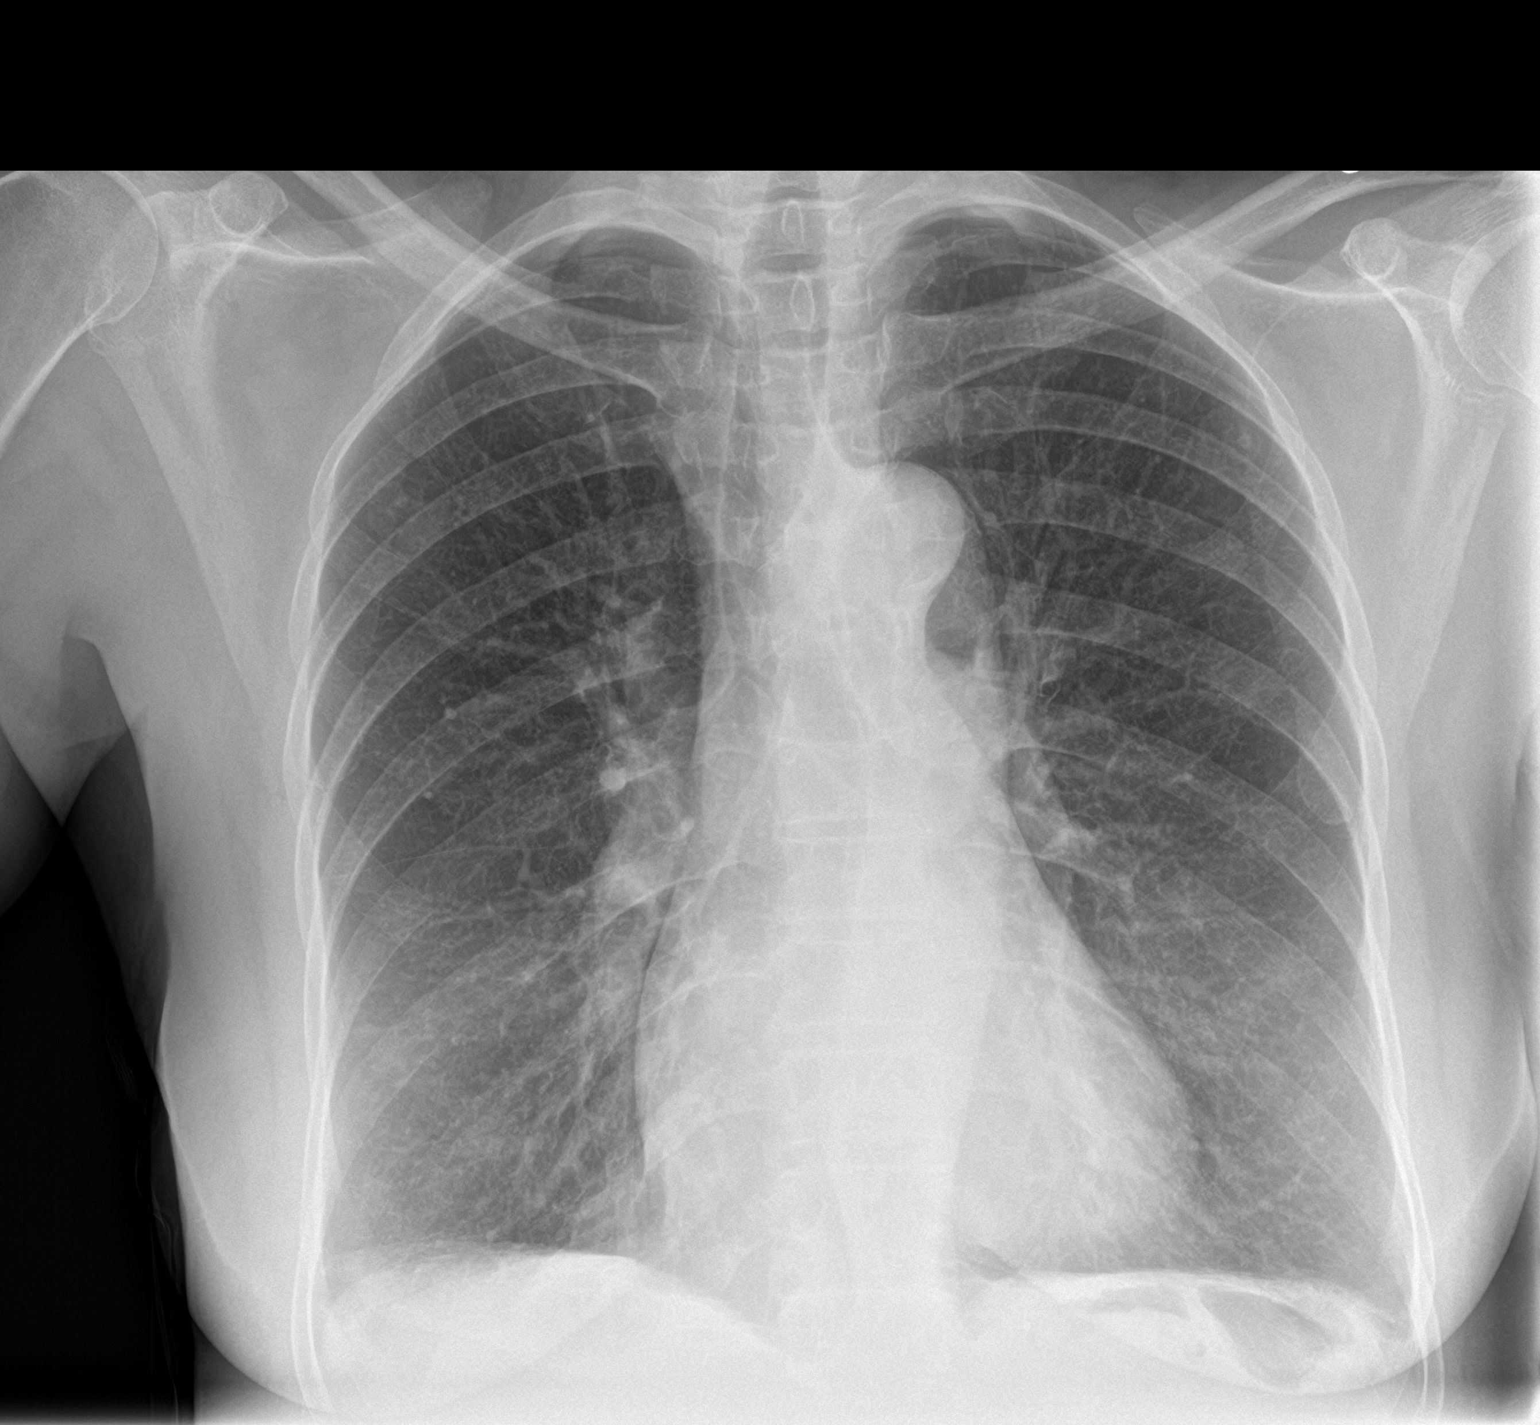

[chest lat]
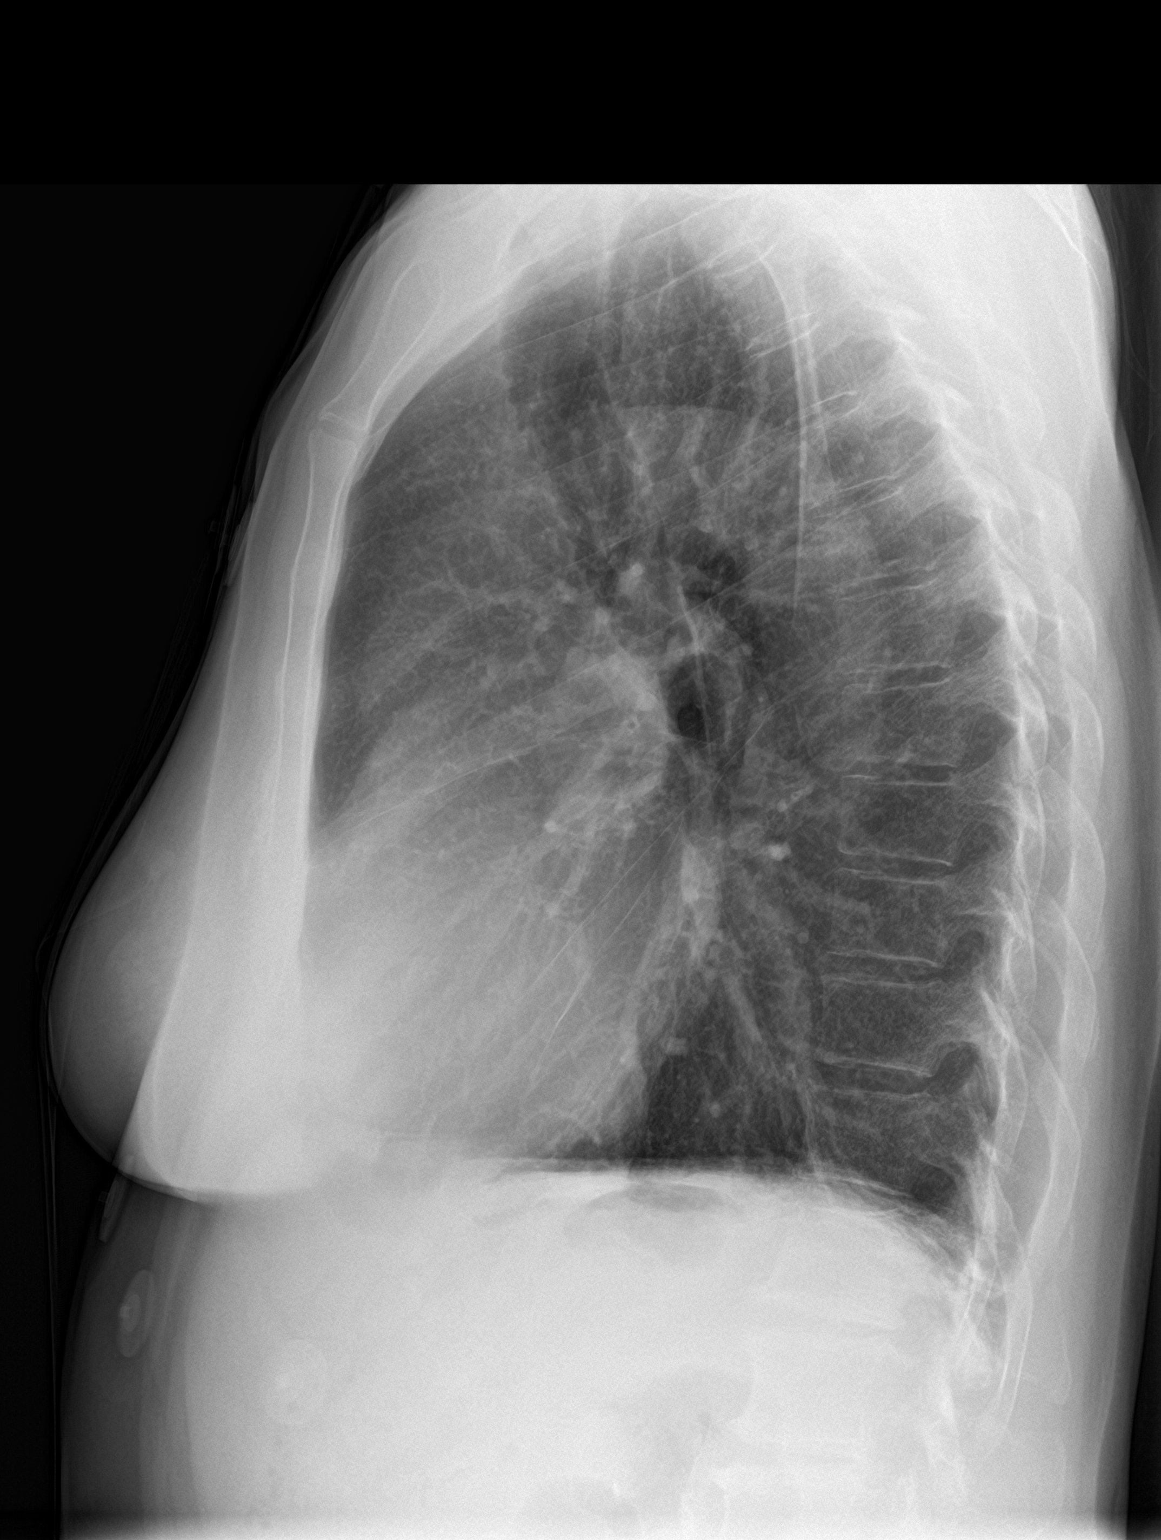

[2 of 2 positions shown; findings below may reference images not displayed]

FINDINGS: The heart size is normal. Mild lower lobe airspace opacities are
present. The lungs are hyperinflated. There is no edema or effusion.
No significant airspace consolidation is present otherwise.
IMPRESSION: 1. Stable hyperinflation and minimal bibasilar opacities, likely
reflecting atelectasis or scarring.

## 2018-08-12 ENCOUNTER — Other Ambulatory Visit (HOSPITAL_COMMUNITY): Payer: Self-pay | Admitting: Nurse Practitioner

## 2018-08-14 ENCOUNTER — Other Ambulatory Visit (HOSPITAL_COMMUNITY): Payer: Self-pay | Admitting: Nurse Practitioner

## 2018-08-14 ENCOUNTER — Encounter: Payer: Self-pay | Admitting: *Deleted

## 2018-08-14 ENCOUNTER — Other Ambulatory Visit: Payer: Self-pay | Admitting: *Deleted

## 2018-08-14 NOTE — Patient Outreach (Signed)
[facility]) Care Management  [facility] Care Manager  08/14/2018   [name] March 16, 1951 [id number]    RN Health Coach Monthly Outreach   Outreach Attempt:  Successful telephone outreach to patient for follow up.  HIPAA verified with patient.  Patient reporting she is doing well.  States she is getting over allergies with the change of weather.  Reports slight increase in shortness of breath with use of her rescue inhaler about daily or every other day.  Encouraged patient to contact primary care provider for appointment, patient stating she is actually doing much better.  Encounter Medications:  Outpatient Encounter Medications as of 08/14/2018  Medication Sig Note  . dicyclomine (BENTYL) 20 MG tablet Take 1 tablet (20 mg total) by mouth 2 (two) times daily.   Marland Kitchen diltiazem (CARTIA XT) 120 MG 24 hr capsule TAKE 1 CAPSULE(120 MG) BY MOUTH EVERY EVENING (Patient taking differently: Take 120 mg by mouth every evening. TAKE 1 CAPSULE(120 MG) BY MOUTH EVERY EVENING)   . esomeprazole (NEXIUM) 20 MG capsule Take 2 capsules (40 mg total) by mouth daily. (Patient taking differently: Take 20 mg by mouth daily. )   . FLOVENT HFA 44 MCG/ACT inhaler Inhale 1 puff into the lungs 2 (two) times daily.   Marland Kitchen levalbuterol (XOPENEX HFA) 45 MCG/ACT inhaler Inhale 1 puff into the lungs every 6 (six) hours as needed for shortness of breath.   . levocetirizine (XYZAL) 5 MG tablet Take 1 tablet (5 mg total) by mouth every evening.   Marland Kitchen losartan (COZAAR) 50 MG tablet Take 50 mg by mouth daily. 08/14/2018: Patient reports taking 100 mg daily per physician instructions  . montelukast (SINGULAIR) 10 MG tablet Take 1 tablet (10 mg total) by mouth at bedtime.   . Multiple Vitamin (MULTI-VITAMIN PO) Take 1 tablet by mouth daily. **Women Day 60+**   . potassium chloride (K-DUR,KLOR-CON) 10 MEQ tablet Take 1 tablet (10 mEq total) by mouth 2 (two) times daily.   . rivaroxaban (XARELTO) 20 MG TABS tablet Take  1 tablet (20 mg total) by mouth daily with supper.   Marland Kitchen spironolactone (ALDACTONE) 25 MG tablet Take 1 tablet (25 mg total) by mouth daily.   . sucralfate (CARAFATE) 1 GM/10ML suspension Take 10 mLs (1 g total) by mouth 4 (four) times daily.   Marland Kitchen tiotropium (SPIRIVA) 18 MCG inhalation capsule Place 1 capsule (18 mcg total) into inhaler and inhale daily.   . diclofenac sodium (VOLTAREN) 1 % GEL Apply 2 g topically 4 (four) times daily.   Marland Kitchen diltiazem (CARDIZEM) 60 MG tablet TAKE 1 TABLET BY MOUTH EVERY 6 HOURS AS NEEDED FOR RAPID AFIB HEART RATE OVER 100   . linaclotide (LINZESS) 145 MCG CAPS capsule Take 1 capsule (145 mcg total) by mouth daily before breakfast. (Patient not taking: Reported on 06/03/2018)   . magnesium hydroxide (MILK OF MAGNESIA) 400 MG/5ML suspension Take 15-30 mLs by mouth at bedtime as needed for mild constipation.   . polyethylene glycol powder (GLYCOLAX/MIRALAX) powder Mix 17g of Miralax in 8 ounces of water every other day (Patient taking differently: Take 17 g by mouth as needed for mild constipation. )   . simethicone (GAS-X) 80 MG chewable tablet Chew 1 tablet (80 mg total) by mouth every 6 (six) hours as needed for flatulence. (Patient not taking: Reported on 06/26/2018)   . triamcinolone cream (KENALOG) 0.1 % Apply 1 application topically 2 (two) times daily.    No facility-administered encounter medications on file as of 08/14/2018.  Functional Status:  In your present state of health, do you have any difficulty performing the following activities: 06/30/2018 05/15/2018  Hearing? N N  Vision? N N  Difficulty concentrating or making decisions? N N  Walking or climbing stairs? Y Y  Comment "Sometimes" -  Dressing or bathing? N N  Doing errands, shopping? N N  Preparing Food and eating ? - -  Using the Toilet? - -  In the past six months, have you accidently leaked urine? - -  Do you have problems with loss of bowel control? - -  Managing your Medications? - -   Managing your Finances? - -  Housekeeping or managing your Housekeeping? - -  Some recent data might be hidden    Fall/Depression Screening: Fall Risk  06/30/2018 06/13/2018 05/15/2018  Falls in the past year? 0 No No  Risk for fall due to : - - -   PHQ 2/9 Scores 06/30/2018 05/15/2018 03/16/2018 02/10/2018 01/30/2018 01/20/2018 12/09/2017  PHQ - 2 Score - 0 0 0 0 0 0  Exception Documentation Patient refusal - - - - - -    THN CM Care Plan Problem One     Most Recent Value  Care Plan Problem One  Knowledge Deficit in Self Management of COPD  Role Documenting the Problem One  Health Coach  Care Plan for Problem One  Active  THN Long Term Goal   Patient will have no emergency room visits in the next 90 days.  THN Long Term Goal Start Date  08/14/18  THN Long Term Goal Met Date  08/14/18  Interventions for Problem One Long Term Goal  Current care plan and goals reviewed and dicussed with patient, encouraged patient to contact primary care provider to schedule follow up appointment as instructed per last office note for 4 week follow up (December 2019), reviewed medications and encouraged compliance, encouraged patient to contact provider for increased need of rescue inhaler or incease in shortness of breath or palpitations  THN CM Short Term Goal #4  Patient will have a better understanding of purselip breathing within the next 30 days  THN CM Short Term Goal #4 Start Date  08/14/18  Interventions for Short Term Goal #4  Confirmed patient has COPD educational material, encouraged patient to post flyer on refridgerator, reviewed COPD action plan and zones with patient, encouraged patient to continue to review educational material     Appointments:  Patient attended medical appointment with primary care provider on 06/30/2018.  At that appointment there should have been 4 week follow up.  Next scheduled appointment is 10/17/2018.  Encouraged patient to contact office to schedule 4 week follow up as  soon as possible per last note recommendations and increase in rescue inhaler use.  Plan: RN Health Coach will send primary care provider Quarterly Update. RN Health Coach will schedule next telephone outreach to patient with in the month of February.  Rhae Lerner RN [facility] Care Management  RN Health Coach [phone] [email or web address]

## 2018-08-28 ENCOUNTER — Observation Stay (HOSPITAL_COMMUNITY)
Admission: EM | Admit: 2018-08-28 | Discharge: 2018-08-29 | Disposition: A | Discharge status: Home / Self Care | Payer: Medicare Other | Attending: Oncology | Admitting: Oncology

## 2018-08-28 ENCOUNTER — Other Ambulatory Visit: Payer: Self-pay | Admitting: Internal Medicine

## 2018-08-28 ENCOUNTER — Other Ambulatory Visit: Payer: Self-pay

## 2018-08-28 ENCOUNTER — Encounter (HOSPITAL_COMMUNITY): Payer: Self-pay | Admitting: *Deleted

## 2018-08-28 ENCOUNTER — Emergency Department (HOSPITAL_COMMUNITY): Payer: Medicare Other

## 2018-08-28 DIAGNOSIS — R05 Cough: Secondary | ICD-10-CM | POA: Diagnosis not present

## 2018-08-28 DIAGNOSIS — K219 Gastro-esophageal reflux disease without esophagitis: Secondary | ICD-10-CM | POA: Diagnosis not present

## 2018-08-28 DIAGNOSIS — E785 Hyperlipidemia, unspecified: Secondary | ICD-10-CM | POA: Diagnosis not present

## 2018-08-28 DIAGNOSIS — R079 Chest pain, unspecified: Secondary | ICD-10-CM | POA: Diagnosis not present

## 2018-08-28 DIAGNOSIS — Z9889 Other specified postprocedural states: Secondary | ICD-10-CM

## 2018-08-28 DIAGNOSIS — J4521 Mild intermittent asthma with (acute) exacerbation: Secondary | ICD-10-CM | POA: Diagnosis not present

## 2018-08-28 DIAGNOSIS — I10 Essential (primary) hypertension: Secondary | ICD-10-CM

## 2018-08-28 DIAGNOSIS — J45901 Unspecified asthma with (acute) exacerbation: Secondary | ICD-10-CM | POA: Diagnosis present

## 2018-08-28 DIAGNOSIS — E876 Hypokalemia: Secondary | ICD-10-CM | POA: Insufficient documentation

## 2018-08-28 DIAGNOSIS — Z87891 Personal history of nicotine dependence: Secondary | ICD-10-CM | POA: Diagnosis not present

## 2018-08-28 DIAGNOSIS — R000 Tachycardia, unspecified: Secondary | ICD-10-CM | POA: Diagnosis not present

## 2018-08-28 DIAGNOSIS — Z7901 Long term (current) use of anticoagulants: Secondary | ICD-10-CM | POA: Diagnosis not present

## 2018-08-28 DIAGNOSIS — I480 Paroxysmal atrial fibrillation: Secondary | ICD-10-CM | POA: Diagnosis not present

## 2018-08-28 LAB — BRAIN NATRIURETIC PEPTIDE: B NATRIURETIC PEPTIDE 5: 30.9 pg/mL (ref 0.0–100.0)

## 2018-08-28 LAB — CBC WITH DIFFERENTIAL/PLATELET
Abs Immature Granulocytes: 0.01 10*3/uL (ref 0.00–0.07)
Basophils Absolute: 0 10*3/uL (ref 0.0–0.1)
Basophils Relative: 0 %
EOS ABS: 0.2 10*3/uL (ref 0.0–0.5)
Eosinophils Relative: 5 %
HEMATOCRIT: 41.1 % (ref 36.0–46.0)
HEMOGLOBIN: 12.8 g/dL (ref 12.0–15.0)
Immature Granulocytes: 0 %
LYMPHS PCT: 29 %
Lymphs Abs: 1.3 10*3/uL (ref 0.7–4.0)
MCH: 25.8 pg — ABNORMAL LOW (ref 26.0–34.0)
MCHC: 31.1 g/dL (ref 30.0–36.0)
MCV: 82.9 fL (ref 80.0–100.0)
MONO ABS: 0.5 10*3/uL (ref 0.1–1.0)
Monocytes Relative: 11 %
Neutro Abs: 2.4 10*3/uL (ref 1.7–7.7)
Neutrophils Relative %: 55 %
Platelets: 242 10*3/uL (ref 150–400)
RBC: 4.96 MIL/uL (ref 3.87–5.11)
RDW: 15.9 % — ABNORMAL HIGH (ref 11.5–15.5)
WBC: 4.5 10*3/uL (ref 4.0–10.5)
nRBC: 0 % (ref 0.0–0.2)

## 2018-08-28 LAB — COMPREHENSIVE METABOLIC PANEL
ALK PHOS: 86 U/L (ref 38–126)
ALT: 21 U/L (ref 0–44)
ANION GAP: 11 (ref 5–15)
AST: 20 U/L (ref 15–41)
Albumin: 4 g/dL (ref 3.5–5.0)
BILIRUBIN TOTAL: 0.7 mg/dL (ref 0.3–1.2)
BUN: 10 mg/dL (ref 8–23)
CALCIUM: 9.6 mg/dL (ref 8.9–10.3)
CO2: 21 mmol/L — ABNORMAL LOW (ref 22–32)
Chloride: 107 mmol/L (ref 98–111)
Creatinine, Ser: 0.82 mg/dL (ref 0.44–1.00)
GFR calc Af Amer: >60 mL/min (ref >60)
GFR calc non Af Amer: >60 mL/min (ref >60)
Glucose, Bld: 80 mg/dL (ref 70–99)
Potassium: 3.3 mmol/L — ABNORMAL LOW (ref 3.5–5.1)
SODIUM: 139 mmol/L (ref 135–145)
Total Protein: 7.8 g/dL (ref 6.5–8.1)

## 2018-08-28 LAB — TROPONIN I: Troponin I: <0.03 ng/mL (ref <0.03)

## 2018-08-28 MED ORDER — LORATADINE 10 MG PO TABS
10.0000 mg | ORAL_TABLET | Freq: Every day | ORAL | Status: DC
  Administered 2018-08-28 – 2018-08-29 (×2): 10 mg via ORAL
  Filled 2018-08-28 (×2): qty 1

## 2018-08-28 MED ORDER — ONDANSETRON HCL 4 MG/2ML IJ SOLN: 4.0000 mg | Freq: Four times a day (QID) | INTRAMUSCULAR | Status: DC | PRN

## 2018-08-28 MED ORDER — AZITHROMYCIN 250 MG PO TABS
500.0000 mg | ORAL_TABLET | Freq: Once | ORAL | Status: AC
  Administered 2018-08-28: 500 mg via ORAL
  Filled 2018-08-28: qty 2

## 2018-08-28 MED ORDER — UMECLIDINIUM BROMIDE 62.5 MCG/INH IN AEPB
1.0000 | INHALATION_SPRAY | Freq: Every day | RESPIRATORY_TRACT | Status: DC
  Administered 2018-08-29: 1 via RESPIRATORY_TRACT
  Filled 2018-08-28: qty 7

## 2018-08-28 MED ORDER — PREDNISONE 20 MG PO TABS
40.0000 mg | ORAL_TABLET | Freq: Every day | ORAL | Status: DC
  Administered 2018-08-29: 40 mg via ORAL
  Filled 2018-08-28: qty 2

## 2018-08-28 MED ORDER — LEVALBUTEROL HCL 1.25 MG/0.5ML IN NEBU
1.2500 mg | INHALATION_SOLUTION | Freq: Once | RESPIRATORY_TRACT | Status: AC
  Administered 2018-08-28: 1.25 mg via RESPIRATORY_TRACT
  Filled 2018-08-28: qty 0.5

## 2018-08-28 MED ORDER — ONDANSETRON HCL 4 MG PO TABS: 4.0000 mg | ORAL_TABLET | Freq: Four times a day (QID) | ORAL | Status: DC | PRN

## 2018-08-28 MED ORDER — RIVAROXABAN 20 MG PO TABS
20.0000 mg | ORAL_TABLET | Freq: Every day | ORAL | Status: DC
  Administered 2018-08-28: 20 mg via ORAL
  Filled 2018-08-28 (×2): qty 1

## 2018-08-28 MED ORDER — ACETAMINOPHEN 325 MG PO TABS
650.0000 mg | ORAL_TABLET | Freq: Four times a day (QID) | ORAL | Status: DC | PRN
  Administered 2018-08-29: 650 mg via ORAL
  Filled 2018-08-28: qty 2

## 2018-08-28 MED ORDER — DILTIAZEM HCL ER COATED BEADS 120 MG PO CP24
120.0000 mg | ORAL_CAPSULE | Freq: Every evening | ORAL | Status: DC
  Administered 2018-08-28: 120 mg via ORAL
  Filled 2018-08-28 (×2): qty 1

## 2018-08-28 MED ORDER — LOSARTAN POTASSIUM 50 MG PO TABS
50.0000 mg | ORAL_TABLET | Freq: Every day | ORAL | Status: DC
  Administered 2018-08-28 – 2018-08-29 (×2): 50 mg via ORAL
  Filled 2018-08-28 (×3): qty 1

## 2018-08-28 MED ORDER — TIOTROPIUM BROMIDE MONOHYDRATE 18 MCG IN CAPS: 18.0000 ug | ORAL_CAPSULE | Freq: Every day | RESPIRATORY_TRACT | Status: DC

## 2018-08-28 MED ORDER — POLYETHYLENE GLYCOL 3350 17 G PO PACK: 17.0000 g | PACK | Freq: Every day | ORAL | Status: DC | PRN

## 2018-08-28 MED ORDER — FLUTICASONE PROPIONATE HFA 44 MCG/ACT IN AERO: 1.0000 | INHALATION_SPRAY | Freq: Two times a day (BID) | RESPIRATORY_TRACT | Status: DC

## 2018-08-28 MED ORDER — BUDESONIDE 0.25 MG/2ML IN SUSP
0.2500 mg | Freq: Two times a day (BID) | RESPIRATORY_TRACT | Status: DC
  Administered 2018-08-28 – 2018-08-29 (×2): 0.25 mg via RESPIRATORY_TRACT
  Filled 2018-08-28 (×2): qty 2

## 2018-08-28 MED ORDER — POTASSIUM CHLORIDE CRYS ER 20 MEQ PO TBCR
40.0000 meq | EXTENDED_RELEASE_TABLET | Freq: Two times a day (BID) | ORAL | Status: AC
  Administered 2018-08-28 – 2018-08-29 (×2): 40 meq via ORAL
  Filled 2018-08-28 (×2): qty 2

## 2018-08-28 MED ORDER — ENOXAPARIN SODIUM 40 MG/0.4ML ~~LOC~~ SOLN: 40.0000 mg | SUBCUTANEOUS | Status: DC

## 2018-08-28 MED ORDER — PREDNISONE 20 MG PO TABS
60.0000 mg | ORAL_TABLET | Freq: Once | ORAL | Status: AC
  Administered 2018-08-28: 60 mg via ORAL
  Filled 2018-08-28: qty 3

## 2018-08-28 MED ORDER — MONTELUKAST SODIUM 10 MG PO TABS
10.0000 mg | ORAL_TABLET | Freq: Every day | ORAL | Status: DC
  Administered 2018-08-28: 10 mg via ORAL
  Filled 2018-08-28 (×2): qty 1

## 2018-08-28 MED ORDER — LEVALBUTEROL HCL 0.63 MG/3ML IN NEBU
0.6300 mg | INHALATION_SOLUTION | Freq: Four times a day (QID) | RESPIRATORY_TRACT | Status: DC | PRN
  Administered 2018-08-28: 0.63 mg via RESPIRATORY_TRACT
  Filled 2018-08-28: qty 3

## 2018-08-28 MED ORDER — ACETAMINOPHEN 650 MG RE SUPP: 650.0000 mg | Freq: Four times a day (QID) | RECTAL | Status: DC | PRN

## 2018-08-28 MED ORDER — STERILE WATER FOR INJECTION IJ SOLN
INTRAMUSCULAR | Status: AC
  Filled 2018-08-28: qty 10

## 2018-08-28 MED ORDER — MAGNESIUM SULFATE 2 GM/50ML IV SOLN
2.0000 g | Freq: Once | INTRAVENOUS | Status: AC
  Administered 2018-08-28: 2 g via INTRAVENOUS
  Filled 2018-08-28: qty 50

## 2018-08-28 NOTE — ED Triage Notes (Signed)
Pt in c/o increased shortness of breath with cough and congestion, symptoms for the last two weeks, using her inhaler at home without relief, states cough is productive but she is having trouble getting anything up

## 2018-08-28 NOTE — ED Notes (Signed)
Pt reports some relief with breathing tx and is okay with going to the hallway

## 2018-08-28 NOTE — ED Notes (Signed)
Pt ambulated in hallway approx 36 yd when pt began to have return of significant wheezing and shortness of breath. 02 sats while walking went from 97% to 91%. MD in formed.

## 2018-08-28 NOTE — ED Notes (Signed)
Dinner Tray Ordered @ 1720-per RN-called by Christoher Drudge  

## 2018-08-28 NOTE — ED Notes (Signed)
Dinner Tray Ordered @ 1720-per Patty, RN-called by Levada Dy

## 2018-08-28 NOTE — ED Provider Notes (Signed)
[facility] EMERGENCY DEPARTMENT Provider Note   CSN: [id number] Arrival date & time: 08/28/18  1059     History   Chief Complaint Chief Complaint  Patient presents with  . Cough    HPI [name] is a 68 y.o. female.   Cough  This is a recurrent problem. The current episode started more than 1 week ago. The problem occurs constantly. The problem has been gradually worsening. The cough is non-productive. There has been no fever. Associated symptoms include chest pain (some with coughing), shortness of breath and wheezing. Pertinent negatives include no headaches and no rhinorrhea. Treatments tried: albuterol. The treatment provided mild relief. Her past medical history is significant for asthma.    Past Medical History:  Diagnosis Date  . A-fib ([place])   . Allergy    seasonal  . Asthma   . Chronic anticoagulation   . Eczema   . GERD (gastroesophageal reflux disease)   . Hypertension   . PAF (paroxysmal atrial fibrillation) ([place]) 11/26/2015   April/May 2017: Multiple ED visits for A. Fib.  02/12/16: Improved on diltiazem IV. Cardiology consulted in the ED and discharge patient with diltiazem XR 120 mg once daily with 60 mg by mouth every 6 hours as needed for symptomatic palpitations, flecainide 50 mg twice daily. Referred to the atrial fibrillation clinic.  02/17/16: Seen in the atrial fibrillation clinic by NP Roderic Palau. Normal sin    Patient Active Problem List   Diagnosis Date Noted  . Nasal congestion 06/14/2018  . Primary osteoarthritis involving multiple joints 06/14/2018  . Acute pain of left knee 05/15/2018  . Hyperpigmented skin lesion 09/09/2017  . Osteoporosis without current pathological fracture 04/15/2017  . Normocytic anemia 10/19/2016  . Hyperlipidemia 03/18/2016  . Special screening for malignant neoplasms, colon 03/17/2016  . Eczema 03/17/2016  . Cervical cancer screening 03/17/2016  . Assistance with transportation 03/17/2016  .  Hypertension   . GERD (gastroesophageal reflux disease)   . PAF (paroxysmal atrial fibrillation) ([place]) 11/26/2015  . Chronic anticoagulation 11/26/2015  . Marijuana abuse 06/11/2015  . Asthma, mild intermittent     Past Surgical History:  Procedure Laterality Date  . ATRIAL FIBRILLATION ABLATION N/A 09/30/2017   Procedure: ATRIAL FIBRILLATION ABLATION;  Surgeon: Thompson Grayer, MD;  Location: [facility];  Service: Cardiovascular;  Laterality: N/A;  . TONSILLECTOMY    . TUBAL LIGATION       OB History   No obstetric history on file.      Home Medications    Prior to Admission medications   Medication Sig Start Date End Date Taking? Authorizing Provider  diclofenac sodium (VOLTAREN) 1 % GEL Apply 2 g topically 4 (four) times daily. 06/13/18   Bloomfield, Carley D, DO  dicyclomine (BENTYL) 20 MG tablet Take 1 tablet (20 mg total) by mouth 2 (two) times daily. 01/25/18   Carlisle Cater, PA-C  diltiazem (CARDIZEM) 60 MG tablet TAKE 1 TABLET BY MOUTH EVERY 6 HOURS AS NEEDED FOR RAPID AFIB HEART RATE OVER 100 08/14/18   Sherran Needs, NP  diltiazem (CARTIA XT) 120 MG 24 hr capsule TAKE 1 CAPSULE(120 MG) BY MOUTH EVERY EVENING Patient taking differently: Take 120 mg by mouth every evening. TAKE 1 CAPSULE(120 MG) BY MOUTH EVERY EVENING 01/02/18   Sherran Needs, NP  esomeprazole (NEXIUM) 20 MG capsule Take 2 capsules (40 mg total) by mouth daily. Patient taking differently: Take 20 mg by mouth daily.  06/13/18 12/10/18  Modena Nunnery D,  DO  FLOVENT HFA 44 MCG/ACT inhaler Inhale 1 puff into the lungs 2 (two) times daily. 06/06/18   [provider]  levalbuterol (XOPENEX HFA) 45 MCG/ACT inhaler Inhale 1 puff into the lungs every 6 (six) hours as needed for shortness of breath. 11/21/17   Carmin Muskrat, MD  levocetirizine (XYZAL) 5 MG tablet Take 1 tablet (5 mg total) by mouth every evening. 06/03/18   Carmin Muskrat, MD  linaclotide [facility]) 145 MCG CAPS capsule Take  1 capsule (145 mcg total) by mouth daily before breakfast. Patient not taking: Reported on 06/03/2018 02/10/18   Zehr, Laban Emperor, PA-C  losartan (COZAAR) 50 MG tablet Take 50 mg by mouth daily.    [provider]  magnesium hydroxide (MILK OF MAGNESIA) 400 MG/5ML suspension Take 15-30 mLs by mouth at bedtime as needed for mild constipation. 11/01/17   Harris, Abigail, PA-C  montelukast (SINGULAIR) 10 MG tablet Take 1 tablet (10 mg total) by mouth at bedtime. 05/16/18   [name], MD  Multiple Vitamin (MULTI-VITAMIN PO) Take 1 tablet by mouth daily. **Women Day 60+**    [provider]  polyethylene glycol powder (GLYCOLAX/MIRALAX) powder Mix 17g of Miralax in 8 ounces of water every other day Patient taking differently: Take 17 g by mouth as needed for mild constipation.  07/20/17   [name], MD  potassium chloride (K-DUR,KLOR-CON) 10 MEQ tablet Take 1 tablet (10 mEq total) by mouth 2 (two) times daily. 03/08/18   Sherran Needs, NP  rivaroxaban (XARELTO) 20 MG TABS tablet Take 1 tablet (20 mg total) by mouth daily with supper. 10/25/17   Sherran Needs, NP  simethicone (GAS-X) 80 MG chewable tablet Chew 1 tablet (80 mg total) by mouth every 6 (six) hours as needed for flatulence. Patient not taking: Reported on 06/26/2018 11/01/17   Margarita Mail, PA-C  spironolactone (ALDACTONE) 25 MG tablet Take 1 tablet (25 mg total) by mouth daily. 06/13/18   Bloomfield, Carley D, DO  sucralfate (CARAFATE) 1 GM/10ML suspension Take 10 mLs (1 g total) by mouth 4 (four) times daily. 01/30/18 01/30/19  Melanee Spry, MD  tiotropium (SPIRIVA) 18 MCG inhalation capsule Place 1 capsule (18 mcg total) into inhaler and inhale daily. 05/26/18   [name], MD  triamcinolone cream (KENALOG) 0.1 % Apply 1 application topically 2 (two) times daily. 05/06/18   Henderly, Britni A, PA-C    Family History Family History  Problem Relation Age of Onset  . Heart attack Mother        [name] age  .  Breast cancer Sister        Late 18s; now s/p mastectomy   . Lung cancer Brother        x 2  . Cancer Sister   . Cancer Sister   . Cancer Sister   . Colon cancer Neg Hx   . Colon polyps Neg Hx   . Esophageal cancer Neg Hx   . Rectal cancer Neg Hx   . Stomach cancer Neg Hx     Social History Social History   Tobacco Use  . Smoking status: Former Smoker    Last attempt to quit: 10/24/2015    Years since quitting: 2.8  . Smokeless tobacco: Never Used  Substance Use Topics  . Alcohol use: No  . Drug use: No    Types: Marijuana    Comment: Smoked marijuana in the past.     Allergies   Albuterol; Celecoxib; and Protonix [pantoprazole sodium]  Review of Systems Review of Systems  HENT: Negative for rhinorrhea.   Respiratory: Positive for cough, shortness of breath and wheezing.   Cardiovascular: Positive for chest pain (some with coughing).  Neurological: Negative for headaches.  All other systems reviewed and are negative.    Physical Exam Updated Vital Signs BP (!) 161/104   Pulse 79   Temp 98 F (36.7 C) (Oral)   Resp (!) 22   SpO2 95%   Physical Exam Vitals signs and nursing note reviewed.  Constitutional:      Appearance: She is well-developed.  HENT:     Head: Normocephalic and atraumatic.     Mouth/Throat:     Mouth: Mucous membranes are moist.  Eyes:     Extraocular Movements: Extraocular movements intact.     Conjunctiva/sclera: Conjunctivae normal.     Pupils: Pupils are equal, round, and reactive to light.  Neck:     Musculoskeletal: Normal range of motion.  Cardiovascular:     Rate and Rhythm: Normal rate and regular rhythm.  Pulmonary:     Effort: Pulmonary effort is normal. Tachypnea present. No respiratory distress.     Breath sounds: No stridor. Wheezing present.  Abdominal:     General: There is no distension.  Musculoskeletal:        General: No swelling or tenderness.     Right lower leg: No edema.     Left lower leg: No edema.   Skin:    General: Skin is warm and dry.  Neurological:     Mental Status: She is alert.      ED Treatments / Results  Labs (all labs ordered are listed, but only abnormal results are displayed) Labs Reviewed  CBC WITH DIFFERENTIAL/PLATELET - Abnormal; Notable for the following components:      Result Value   MCH 25.8 (*)    RDW 15.9 (*)    All other components within normal limits  COMPREHENSIVE METABOLIC PANEL - Abnormal; Notable for the following components:   Potassium 3.3 (*)    CO2 21 (*)    All other components within normal limits  TROPONIN I  BRAIN NATRIURETIC PEPTIDE    EKG EKG Interpretation  Date/Time:  Monday August 28 2018 13:08:23 EST Ventricular Rate:  87 PR Interval:    QRS Duration: 86 QT Interval:  372 QTC Calculation: 448 R Axis:   57 Text Interpretation:  Sinus tachycardia Ventricular premature complex RSR' in V1 or V2, probably normal variant Baseline wander in lead(s) V3 V5 impossible to read reasonably, needs repeat Confirmed by Merrily Pew [phone]) on 08/28/2018 1:56:39 PM   Radiology Dg Chest 2 View  Result Date: 08/28/2018 CLINICAL DATA:  Cough for 2 weeks, worsening EXAM: CHEST - 2 VIEW COMPARISON:  06/24/2018 FINDINGS: Normal heart size and mediastinal contours. There is no edema, consolidation, effusion, or pneumothorax. No acute osseous finding. IMPRESSION: Negative for pneumonia. Electronically Signed   By: Monte Fantasia M.D.   On: 08/28/2018 11:47    Procedures Procedures (including critical care time)  Medications Ordered in ED Medications  sterile water (preservative free) injection (has no administration in time range)  levalbuterol (XOPENEX) nebulizer solution 1.25 mg (1.25 mg Nebulization Given 08/28/18 1241)  predniSONE (DELTASONE) tablet 60 mg (60 mg Oral Given 08/28/18 1241)  magnesium sulfate IVPB 2 g 50 mL (0 g Intravenous Stopped 08/28/18 1428)  azithromycin (ZITHROMAX) tablet 500 mg (500 mg Oral Given 08/28/18 1241)      Initial Impression / Assessment and Plan /  ED Course  I have reviewed the triage vital signs and the nursing notes.  Pertinent labs & imaging results that were available during my care of the patient were reviewed by me and considered in my medical decision making (see chart for details).   Patient to multiple breathing treatments prior to arrival.  Here she was given a couple doses of Xopenex, ipratropium, steroids, antibiotics and magnechypnea and tach and she ambulates with sats as low as 91%.  She still diminished with bilateral wheezing.  Will discuss with medicine for observation for more breathing treatments.sium without improvement in her symptoms.  Patient still significantly tachypneic with ambulation.  D/w medicine teaching service who will admit.   Final Clinical Impressions(s) / ED Diagnoses   Final diagnoses:  Mild asthma with exacerbation, unspecified whether persistent    ED Discharge Orders    None       Mahina Salatino, Corene Cornea, MD 08/28/18 1511

## 2018-08-28 NOTE — H&P (Signed)
Date: 08/28/2018               Patient Name:  [name] MRN: [id number]  DOB: May 01, 1951 Age / Sex: 68 y.o., female   PCP: [name], MD         Medical Service: Internal Medicine Teaching Service         Attending Physician: Dr. Annia Belt, MD    First Contact: Dr. Harlow Ohms  Pager: [phone]  Second Contact: Dr. Ina Homes  Pager: [phone]       After Hours (After 5p/  First Contact Pager: [phone]  weekends / holidays): Second Contact Pager: [phone]   Chief Complaint: Cough   History of Present Illness: Michole Lecuyer is a 68 year old female with a history of paroxysmal atrial fibrillation s/p ablation and well-controlled asthma who presented to the emergency department with two weeks of progressive shortness of breath and wheezing. She states that approximately two weeks ago with the change in whether she began to notice increased exertional dyspnea, cough, and wheezing. She has a home nebulizer and got to the point where she was using 4 to 5 albuterol treatments per day. The albuterol did not seem to help with her symptoms so she elected to come to the emergency department for further evaluation. She states that she has had to be hospitalized in the past for her asthma but has never had to be intubated. She currently uses Spiriva and Singulair to control her symptoms states that she usually gets a symptom exacerbation with any change in weather.  In the ED the patient was found to be hemodynamically stable but with diffuse wheezing. She was treated with Xopenex, ipratropium, steroids, antibiotics and magnesium. The plan was for discharge but with ambulation the patient had worsening SOB and wheezing. Therefore, she was subsequently admitted for further evaluation and management.   Meds: Current Meds  Medication Sig  . acetaminophen (TYLENOL) 500 MG tablet Take 500 mg by mouth daily as needed for headache (pain).  . cetirizine (ZYRTEC) 10 MG tablet Take 10 mg  by mouth daily.  Marland Kitchen dicyclomine (BENTYL) 20 MG tablet Take 1 tablet (20 mg total) by mouth 2 (two) times daily. (Patient taking differently: Take 20 mg by mouth every other day. )  . diltiazem (CARDIZEM) 60 MG tablet TAKE 1 TABLET BY MOUTH EVERY 6 HOURS AS NEEDED FOR RAPID AFIB HEART RATE OVER 100 (Patient taking differently: Take 60 mg by mouth every 6 (six) hours as needed (for rapid afib heart rate over 100). )  . diltiazem (CARTIA XT) 120 MG 24 hr capsule TAKE 1 CAPSULE(120 MG) BY MOUTH EVERY EVENING (Patient taking differently: Take 120 mg by mouth every evening. TAKE 1 CAPSULE(120 MG) BY MOUTH EVERY EVENING)  . esomeprazole (NEXIUM) 20 MG capsule Take 2 capsules (40 mg total) by mouth daily. (Patient taking differently: Take 40 mg by mouth every other day. )  . fluticasone (FLOVENT HFA) 44 MCG/ACT inhaler Inhale 1 puff into the lungs 2 (two) times daily.  Marland Kitchen levalbuterol (XOPENEX HFA) 45 MCG/ACT inhaler Inhale 1 puff into the lungs every 6 (six) hours as needed for shortness of breath.  . losartan (COZAAR) 50 MG tablet Take 50 mg by mouth daily.  . magnesium hydroxide (MILK OF MAGNESIA) 400 MG/5ML suspension Take 15-30 mLs by mouth at bedtime as needed for mild constipation.  . montelukast (SINGULAIR) 10 MG tablet Take 1 tablet (10 mg total) by mouth at bedtime.  . Multiple Vitamin (MULTIVITAMIN  WITH MINERALS) TABS tablet Take 1 tablet by mouth every other day.  . polyethylene glycol powder (GLYCOLAX/MIRALAX) powder Mix 17g of Miralax in 8 ounces of water every other day (Patient taking differently: Take 17 g by mouth daily as needed for mild constipation. )  . potassium chloride (K-DUR,KLOR-CON) 10 MEQ tablet Take 1 tablet (10 mEq total) by mouth 2 (two) times daily.  . rivaroxaban (XARELTO) 20 MG TABS tablet Take 1 tablet (20 mg total) by mouth daily with supper.  . tiotropium (SPIRIVA) 18 MCG inhalation capsule Place 1 capsule (18 mcg total) into inhaler and inhale daily.  Marland Kitchen triamcinolone cream  (KENALOG) 0.1 % Apply 1 application topically 2 (two) times daily. (Patient taking differently: Apply 1 application topically 2 (two) times daily as needed (rash/itching). )   Allergies: Allergies as of 08/28/2018 - Review Complete 08/28/2018  Allergen Reaction Noted  . Albuterol Other (See Comments) 09/20/2017  . Celecoxib Rash 04/11/2007  . Protonix [pantoprazole sodium] Palpitations 11/28/2015   Past Medical History:  Diagnosis Date  . A-fib ([place])   . Allergy    seasonal  . Asthma   . Chronic anticoagulation   . Eczema   . GERD (gastroesophageal reflux disease)   . Hypertension   . PAF (paroxysmal atrial fibrillation) ([place]) 11/26/2015   April/May 2017: Multiple ED visits for A. Fib.  02/12/16: Improved on diltiazem IV. Cardiology consulted in the ED and discharge patient with diltiazem XR 120 mg once daily with 60 mg by mouth every 6 hours as needed for symptomatic palpitations, flecainide 50 mg twice daily. Referred to the atrial fibrillation clinic.  02/17/16: Seen in the atrial fibrillation clinic by NP Roderic Palau. Normal sin   Family History  Problem Relation Age of Onset  . Heart attack Mother        [name] age  . Breast cancer Sister        Late 42s; now s/p mastectomy   . Lung cancer Brother        x 2  . Cancer Sister   . Cancer Sister   . Cancer Sister   . Colon cancer Neg Hx   . Colon polyps Neg Hx   . Esophageal cancer Neg Hx   . Rectal cancer Neg Hx   . Stomach cancer Neg Hx    Social History: Lives alone. Retired. Previously worked in [name] and in [employer]. Denies the use of alcohol. Previously used tobacco products but states that she quit two years ago. Endorses remote history of marijuana use but denies the use of other illicit substances.  Review of Systems: A complete ROS was negative except as per HPI.   Physical Exam: Blood pressure (!) 161/104, pulse 79, temperature 98 F (36.7 C), temperature source Oral, resp. rate (!) 22, SpO2 95  %.  General: Well nourished female in no acute distress, talking in full sentences HENT: Normocephalic, atraumatic, moist mucus membranes Pulm: Good air movement with some inspiratory and expiratory wheezing  CV: Tachycardia but regular rhythm, no murmurs, no rubs  Abdomen: Active bowel sounds, soft, non-distended, no tenderness to palpation  Extremities: Pulses palpable in all extremities, no LE edema  Skin: Warm and dry  Neuro: Alert and oriented x 3  EKG: personally reviewed: my interpretation is sinus tachycardia with normal rhythm and wondering baseline leads. Normal axis. Normal P wave morphology. PR interval within normal limits. QRS morphology is normal with the exception of and leads to be one and V2 it does have  an R/R' morphology but the QRS interval is not greater than 120. Normal ST segments.  CXR: personally reviewed: my interpretation is good penetration, ambulation, and inspiratory volumes. No bony or soft tissue abnormalities noted. The hemi diaphragms appear flattened and she appears to have hyperinflation. Normal cardiac silhouette. No opacities or infiltrates. Lower segments of the bilateral lung fields have increased reticular pattern. Overall this Chest x-ray is unchanged compared to prior films.  Assessment & Plan by Problem: Active Problems:   Asthma exacerbation  Shakeema Lippman is a 68 year old female with a history of paroxysmal atrial fibrillation s/p ablation and well-controlled asthma who presented to the emergency department with signs and symptoms of a mild asthma exacerbation. She was treated in the emergency department with Xopenex, ipratropium, steroids, antibiotics and magnesium. The plan was for discharge but with ambulation the patient had worsening SOB and wheezing. Therefore, she was subsequently admitted for further evaluation and management.  Mild asthma exacerbation - No prior PFTs on file. Based on chest x-ray may have concurrent COPD.  - Patient able  to talk in full sentences but does have some inspiratory and expiratory wheezing. -Start prednisone 40 mg daily, with plan to do a 14 day taper - Continue Spiriva - Continue Singulair - Continue PRN Xopenex - Start Flovent BID  Hypertension - Continue losartan 50 mg daily  Paroxysmal atrial fibrillation status post ablation - Currently in sinus rhythm  - Continue diltiazem 120 mg daily - Continue Xarelto 20 mg daily  FEN: No IVF, Replace electrolytes as needed, Regular diet VTE prophylaxis: Lovenox Code Status: Full code  Dispo: Admit patient to Observation with expected length of stay less than 2 midnights.  Signed: Ina Homes, MD 08/28/2018, 5:22 PM

## 2018-08-28 NOTE — Telephone Encounter (Signed)
Returned call to patient; Pt states "nevermind, I have refills on my RX's and have called the refills into the pharmacy".  No distress noted, no wheezing or coughing noted. SChaplin, RN,BSN

## 2018-08-28 NOTE — Telephone Encounter (Signed)
Pt needs a refill the pills help her stop coughing at [facility], [place], pt contact [phone]

## 2018-08-29 DIAGNOSIS — J45901 Unspecified asthma with (acute) exacerbation: Secondary | ICD-10-CM | POA: Diagnosis not present

## 2018-08-29 DIAGNOSIS — Z9889 Other specified postprocedural states: Secondary | ICD-10-CM | POA: Diagnosis not present

## 2018-08-29 DIAGNOSIS — I480 Paroxysmal atrial fibrillation: Secondary | ICD-10-CM

## 2018-08-29 DIAGNOSIS — E876 Hypokalemia: Secondary | ICD-10-CM | POA: Diagnosis not present

## 2018-08-29 DIAGNOSIS — I10 Essential (primary) hypertension: Secondary | ICD-10-CM | POA: Diagnosis not present

## 2018-08-29 DIAGNOSIS — Z888 Allergy status to other drugs, medicaments and biological substances status: Secondary | ICD-10-CM

## 2018-08-29 DIAGNOSIS — Z7901 Long term (current) use of anticoagulants: Secondary | ICD-10-CM

## 2018-08-29 DIAGNOSIS — Z87891 Personal history of nicotine dependence: Secondary | ICD-10-CM

## 2018-08-29 DIAGNOSIS — Z79899 Other long term (current) drug therapy: Secondary | ICD-10-CM

## 2018-08-29 LAB — BASIC METABOLIC PANEL
Anion gap: 10 (ref 5–15)
BUN: 16 mg/dL (ref 8–23)
CO2: 23 mmol/L (ref 22–32)
Calcium: 10.1 mg/dL (ref 8.9–10.3)
Chloride: 107 mmol/L (ref 98–111)
Creatinine, Ser: 0.95 mg/dL (ref 0.44–1.00)
GFR calc Af Amer: >60 mL/min (ref >60)
GFR calc non Af Amer: >60 mL/min (ref >60)
Glucose, Bld: 168 mg/dL — ABNORMAL HIGH (ref 70–99)
Potassium: 2.9 mmol/L — ABNORMAL LOW (ref 3.5–5.1)
Sodium: 140 mmol/L (ref 135–145)

## 2018-08-29 LAB — MAGNESIUM: Magnesium: 2.1 mg/dL (ref 1.7–2.4)

## 2018-08-29 MED ORDER — LEVALBUTEROL HCL 1.25 MG/0.5ML IN NEBU: 1.2500 mg | INHALATION_SOLUTION | Freq: Four times a day (QID) | RESPIRATORY_TRACT | Status: DC

## 2018-08-29 MED ORDER — PREDNISONE 20 MG PO TABS: 40.0000 mg | ORAL_TABLET | Freq: Every day | ORAL | 0 refills | Status: DC

## 2018-08-29 MED ORDER — RAMELTEON 8 MG PO TABS
8.0000 mg | ORAL_TABLET | Freq: Every day | ORAL | Status: DC
  Filled 2018-08-29: qty 1

## 2018-08-29 MED ORDER — ALBUTEROL (5 MG/ML) CONTINUOUS INHALATION SOLN
20.0000 mg/h | INHALATION_SOLUTION | RESPIRATORY_TRACT | Status: DC
  Administered 2018-08-29: 20 mg/h via RESPIRATORY_TRACT
  Filled 2018-08-29: qty 20

## 2018-08-29 MED ORDER — LEVALBUTEROL HCL 1.25 MG/0.5ML IN NEBU
1.2500 mg | INHALATION_SOLUTION | Freq: Four times a day (QID) | RESPIRATORY_TRACT | Status: DC
  Administered 2018-08-29 (×2): 1.25 mg via RESPIRATORY_TRACT
  Filled 2018-08-29 (×2): qty 0.5

## 2018-08-29 MED ORDER — DILTIAZEM HCL 60 MG PO TABS: 60.0000 mg | ORAL_TABLET | Freq: Once | ORAL | Status: DC

## 2018-08-29 MED ORDER — LEVALBUTEROL HCL 1.25 MG/0.5ML IN NEBU: 1.2500 mg | INHALATION_SOLUTION | RESPIRATORY_TRACT | Status: DC | PRN

## 2018-08-29 MED ORDER — LEVALBUTEROL HCL 1.25 MG/0.5ML IN NEBU: 1.2500 mg | INHALATION_SOLUTION | Freq: Four times a day (QID) | RESPIRATORY_TRACT | 0 refills | Status: DC | PRN

## 2018-08-29 MED ORDER — POTASSIUM CHLORIDE CRYS ER 20 MEQ PO TBCR: 40.0000 meq | EXTENDED_RELEASE_TABLET | Freq: Once | ORAL | Status: DC

## 2018-08-29 MED ORDER — DILTIAZEM HCL 30 MG PO TABS
30.0000 mg | ORAL_TABLET | Freq: Once | ORAL | Status: AC
  Administered 2018-08-29: 30 mg via ORAL
  Filled 2018-08-29: qty 1

## 2018-08-29 MED ORDER — MAGNESIUM HYDROXIDE 400 MG/5ML PO SUSP
15.0000 mL | Freq: Once | ORAL | Status: AC
  Administered 2018-08-29: 15 mL via ORAL
  Filled 2018-08-29: qty 30

## 2018-08-29 NOTE — Progress Notes (Signed)
SATURATION QUALIFICATIONS: (This note is used to comply with regulatory documentation for home oxygen)  Patient Saturations on Room Air at Rest = 99%  Patient Saturations on Room Air while Ambulating = 98%  Patient Saturations on 0 Liters of oxygen while Ambulating = 98%  Please briefly explain why patient needs home oxygen: 

## 2018-08-29 NOTE — Consult Note (Addendum)
   [facility] CM Inpatient Consult   08/29/2018  [name] Jun 13, 1951 [id number]    [name] is active with [facility] services. She has been followed by [facility].   Went to bedside to discuss ongoing [facility] services. [name] is agreeable. She is also agreeable to [place] follow up post hospital discharge.  [facility] Care Management written consent obtained. [facility] folder provided.   [name] also inquires about [facility] assistance for obtaining Ensure. Discussed that [facility] does not provide Ensure but may be of assistance with obtaining coupons. She expresses understanding.   Will make referral to [facility]. [name] is hospitalized for asthma exacerbation. She also has a history of AFIB, HLD, GERD, HTN. Will make referral to [facility] for [employer] (Ensure coupons). [name] states she currently uses [facility] transportation to MD appointments. States she has to double check on how many rides she has left however.  Will update [place].     Marthenia Rolling, MSN-Ed, RN,BSN [facility] Liaison [phone]

## 2018-08-29 NOTE — Progress Notes (Signed)
BBS to auscultation reveals NO wheezing at this time, improved aeration. Improved air exchange. CAT has helped patient. Patient states that she feels much better. Patient is concerned about HR at this time. From baseline her heart rate has elevated from 86bpm to 104bpm. I assure patient that her HR is fine. No chest tightness noted. Patient is stable at this time.

## 2018-08-29 NOTE — Progress Notes (Signed)
Medicine attending: I examined this patient today together with resident physician Dr Harlow Ohms and I concur with her evaluation and management plan.  Please see separate attending admission note for complete details. Respiratory distress during the night.  Responded to additional bronchodilators.  Dose of Cardizem given when she experienced palpitations post and albuterol nebulizer treatment.  Currently stable.  Regular cardiac rhythm.  No respiratory distress.  Residual end expiratory wheezes. Continue current management plan.  Monitor over the course of the day.  If maintaining adequate oxygenation with ambulation, consider late discharge.

## 2018-08-29 NOTE — Progress Notes (Signed)
Called to the bedside for the evaluation and assessment of [name]. On arrival noted mild increase WOB, in the setting of signifcant poor air exchange and aeration. BBS to auscultation reveals marked decrease aeration throughout (TIGHT) with some diffuse expiratory wheezing. Patient is speaking in broken sentence but doesn't appear to be in significant respiratory distress, Mild distress noted. SATs are acceptable 98% on RA. No tachycardia noted on assessment. Informed MD that cardiac monitoring should be order while on CAT. MD states will place order in. RN aware of CAT started. RRT will continue to monitor patient clinical status

## 2018-08-29 NOTE — Progress Notes (Signed)
   Subjective: [name] reports that she had a rough night because she felt short of breath. She received breathing treatments and subsequently went into afib. This morning, she feels jittery and has heart fluttering, but she overall feels improved. She has never had to use oxygen at home for her asthma.  Objective:  Vital signs in last 24 hours: Vitals:   08/28/18 2014 08/28/18 2320 08/29/18 0137 08/29/18 0233  BP:  (!) 132/96    Pulse:  85  (!) 112  Resp:  20  20  Temp:  98 F (36.7 C)    TempSrc:  Oral    SpO2: 96% 96% 100% 100%  Weight:      Height:       Physical Exam  Constitutional: She is oriented to person, place, and time and well-developed, well-nourished, and in no distress.  Cardiovascular: Normal rate, regular rhythm and normal heart sounds.  No murmur heard. Pulmonary/Chest: Effort normal. She has wheezes.  Diffuse expiratory wheezing  Musculoskeletal:        General: No edema.  Neurological: She is alert and oriented to person, place, and time.  Skin: Skin is warm and dry.  Psychiatric: Mood, memory, affect and judgment normal.    Assessment/Plan:  Active Problems:   Asthma exacerbation  [name] is a 68 year old female with a history of paroxysmal atrial fibrillation s/p ablation and well-controlled asthma who presented to the emergency department with signs and symptoms of a mild asthma exacerbation. She was treated in the emergency department with Xopenex, ipratropium, steroids, antibiotics and magnesium. The plan was for discharge but with ambulation the patient had worsening SOB and wheezing. Therefore, she was subsequently admitted for further evaluation and management.  Mild asthma exacerbation - Patient able to talk in full sentences but does have some expiratory wheezing. Will check pulse ox while ambulating today  - continue prednisone 40 mg daily, with plan to do a 14 day taper - Continue Spiriva - Continue Singulair - Continue PRN  Xopenex - Start Flovent BID   Paroxysmal atrial fibrillation status post ablation - Overnight patient went into afib, given 30 mg diltiazem once in addition to her daily 120 mg. Currently in sinus rhythm  - Continue diltiazem 120 mg daily - Continue Xarelto 20 mg daily   Dispo: Anticipated discharge in approximately 0-1day(s).   Mike Craze, DO 08/29/2018, 6:36 AM Pager: [phone]

## 2018-08-29 NOTE — Discharge Summary (Signed)
Name: [name] MRN: [id number] DOB: [birth year] 68 y.o. PCP: Arnetha Courser, MD  Date of Admission: 08/28/2018 11:02 AM Date of Discharge: 08/29/2018 Attending Physician: Levert Feinstein, MD  Discharge Diagnosis: 1. Mild asthma exacerbation  2. Hypokalemia 3. Paroxysmal atrial fibrillation   Discharge Medications: Allergies as of 08/29/2018      Reactions   Albuterol Other (See Comments)   Caused afib    Celecoxib Rash   Protonix [pantoprazole Sodium] Palpitations      Medication List    TAKE these medications   acetaminophen 500 MG tablet Commonly known as:  TYLENOL Take 500 mg by mouth daily as needed for headache (pain).   cetirizine 10 MG tablet Commonly known as:  ZYRTEC Take 10 mg by mouth daily.   diclofenac sodium 1 % Gel Commonly known as:  VOLTAREN Apply 2 g topically 4 (four) times daily.   dicyclomine 20 MG tablet Commonly known as:  BENTYL Take 1 tablet (20 mg total) by mouth 2 (two) times daily. What changed:  when to take this   diltiazem 120 MG 24 hr capsule Commonly known as:  CARTIA XT TAKE 1 CAPSULE(120 MG) BY MOUTH EVERY EVENING What changed:    how much to take  how to take this  when to take this   diltiazem 60 MG tablet Commonly known as:  CARDIZEM TAKE 1 TABLET BY MOUTH EVERY 6 HOURS AS NEEDED FOR RAPID AFIB HEART RATE OVER 100 What changed:  See the new instructions.   esomeprazole 20 MG capsule Commonly known as:  NEXIUM Take 2 capsules (40 mg total) by mouth daily. What changed:  when to take this   fluticasone 44 MCG/ACT inhaler Commonly known as:  FLOVENT HFA Inhale 1 puff into the lungs 2 (two) times daily.   levalbuterol 1.25 MG/0.5ML nebulizer solution Commonly known as:  XOPENEX Take 1.25 mg by nebulization every 6 (six) hours as needed for up to 7 days for wheezing or shortness of breath.   levalbuterol 45 MCG/ACT inhaler Commonly known as:  XOPENEX HFA Inhale 1 puff into the lungs every 6 (six) hours as  needed for shortness of breath.   levocetirizine 5 MG tablet Commonly known as:  XYZAL Take 1 tablet (5 mg total) by mouth every evening.   linaclotide 145 MCG Caps capsule Commonly known as:  LINZESS Take 1 capsule (145 mcg total) by mouth daily before breakfast.   losartan 50 MG tablet Commonly known as:  COZAAR Take 50 mg by mouth daily.   magnesium hydroxide 400 MG/5ML suspension Commonly known as:  MILK OF MAGNESIA Take 15-30 mLs by mouth at bedtime as needed for mild constipation.   montelukast 10 MG tablet Commonly known as:  SINGULAIR Take 1 tablet (10 mg total) by mouth at bedtime.   multivitamin with minerals Tabs tablet Take 1 tablet by mouth every other day.   polyethylene glycol powder powder Commonly known as:  GLYCOLAX/MIRALAX Mix 17g of Miralax in 8 ounces of water every other day What changed:    how much to take  how to take this  when to take this  reasons to take this  additional instructions   potassium chloride 10 MEQ tablet Commonly known as:  K-DUR,KLOR-CON Take 1 tablet (10 mEq total) by mouth 2 (two) times daily.   predniSONE 20 MG tablet Commonly known as:  DELTASONE Take 2 tablets (40 mg total) by mouth daily with breakfast. Start taking on:  August 30, 2018   rivaroxaban  20 MG Tabs tablet Commonly known as:  XARELTO Take 1 tablet (20 mg total) by mouth daily with supper.   simethicone 80 MG chewable tablet Commonly known as:  GAS-X Chew 1 tablet (80 mg total) by mouth every 6 (six) hours as needed for flatulence.   spironolactone 25 MG tablet Commonly known as:  ALDACTONE Take 1 tablet (25 mg total) by mouth daily.   sucralfate 1 GM/10ML suspension Commonly known as:  CARAFATE Take 10 mLs (1 g total) by mouth 4 (four) times daily.   tiotropium 18 MCG inhalation capsule Commonly known as:  SPIRIVA Place 1 capsule (18 mcg total) into inhaler and inhale daily.   triamcinolone cream 0.1 % Commonly known as:  KENALOG Apply  1 application topically 2 (two) times daily. What changed:    when to take this  reasons to take this       Disposition and follow-up:   [name] was discharged from [facility] in Stable condition.  At the hospital follow up visit please address:  1.  Asthma- please make sure patient completed 4 days of prednisone and optimize medical management if patient is still having an exacerbation  2. Hypokalemia- orally repleted, recheck K level  3.  Labs / imaging needed at time of follow-up: bmp  4.  Pending labs/ test needing follow-up: none  Follow-up Appointments: Patient advised to follow up with PCP in one week.  Hospital Course by problem list: 1. Asthma exacerbation:  [name] presented to the emergency department with signs and symptoms of a mild asthma exacerbation. She was treated in the emergency department with Xopenex, ipratropium, steroids, antibiotics and magnesium. The plan was for discharge but with ambulation the patient had worsening SOB and wheezing. Therefore, she was subsequently admitted for further evaluation and management.  She was discharged the following day to continue prednisone for an additional 4 days and levalbuterol nebulizer as needed and to continue her home inhalers.  2. Hypokalemia: On arrival patient had potassium of 3.3.  Day of discharge 2.9.  Orally repleted.  Please recheck at follow-up appointment.  3. Paroxysmal atrial fibrillation: Patient was in normal sinus rhythm on arrival to the ED.  Overnight she converted to A. fib and was given additional 30 mg of diltiazem.  The following morning she was back to normal sinus rhythm.  Discharge Vitals:   BP (!) 142/84   Pulse 97   Temp 98.1 F (36.7 C) (Oral)   Resp (!) 23   Ht 5\' 7"  (1.702 m)   Wt 67 kg   SpO2 98%   BMI 23.12 kg/m   Pertinent Labs, Studies, and Procedures:   BMP Latest Ref Rng & Units 08/29/2018 08/28/2018 06/26/2018  Glucose 70 - 99 mg/dL 914(N) 80  98  BUN 8 - 23 mg/dL 16 10 10   Creatinine 0.44 - 1.00 mg/dL 8.29 5.62 1.30  BUN/Creat Ratio 12 - 28 - - -  Sodium 135 - 145 mmol/L 140 139 139  Potassium 3.5 - 5.1 mmol/L 2.9(L) 3.3(L) 4.0  Chloride 98 - 111 mmol/L 107 107 109  CO2 22 - 32 mmol/L 23 21(L) 27  Calcium 8.9 - 10.3 mg/dL 86.5 9.6 78.4   Dg Chest 2 View  Result Date: 08/28/2018 CLINICAL DATA:  Cough for 2 weeks, worsening EXAM: CHEST - 2 VIEW COMPARISON:  06/24/2018 FINDINGS: Normal heart size and mediastinal contours. There is no edema, consolidation, effusion, or pneumothorax. No acute osseous finding. IMPRESSION: Negative for pneumonia. Electronically  Signed   By: Marnee Spring M.D.   On: 08/28/2018 11:47     Discharge Instructions: Discharge Instructions    AMB Referral to [facility] Care Management   Complete by:  As directed    Active with [facility] RN Health Coach prior to hospitalization. Please assign to [facility] for complex case management and [facility] Social Worker for [employer] regarding [employer] and potential transportation needs (currently uses [facility] transportation). Please refer back to [facility] RN Health Coach after [facility] RN Community follow up. PCP [facility]) listed as doing toc. Written consent obtained. Please call with questions. Thanks.Raiford Noble, MSN-Ed, [facility] Liaison-[phone]   Reason for consult:  Please assign to [facility] Community RNCM and [facility] Social Worker   Diagnoses of:  Other   Other Diagnosis:  asthma, afib   Expected date of contact:  1-3 days (reserved for hospital discharges)   Diet - low sodium heart healthy   Complete by:  As directed    Discharge instructions   Complete by:  As directed    [name],   You were hospitalized for an asthma exacerbation. You were treated with breathing treatments, prednisone and one dose of an antibiotic.  I want you to continue taking prednisone 40 mg once a day for another 4 days. I have also prescribed  you Xopenox nebulizer solution to use as needed if your wheezing or shortness of breath get worse.   Please continue taking all your other medications as prescribed. Also, follow up with your PCP in one week.  Thanks for allowing [name] to be a part of your care!   Increase activity slowly   Complete by:  As directed       Signed: Khyree Carillo, Callie Fielding, DO 08/29/2018, 1:57 PM   Pager: [phone]

## 2018-08-29 NOTE — Discharge Instructions (Signed)

## 2018-08-29 NOTE — Progress Notes (Addendum)
Evaluated at bedside for sob, wheezing, and increased work of breathing preventing patient from resting. RN and RT were at bedside. Ms. Bickham was fully alert, laying in bed on room air and appeared slightly uncomfortable. She was taking shallow breaths, and speaking in broken sentences. She received pulmicort inhaler and levalbuterol nebulizer around 8pm which provided minimal to no relief.   O2 96% on RA, HR in the 80s Gen: uncomfortable appearing but in no acute distress, fully alert Pulm: unable to speak in full sentences without pausing to catch her breath. Diffuse wheezing throughout all lung fields Cardiac: regular rate and rhythm   A/P: - continuous albuterol nebulizer for 1 hour  - continuous cardiac monitoring (required for continuous nebulizer treatments)  Francesco Runner, MD Pager: [phone]   ADDENDUM 3:32AM Patient was reevaluated at bedside for tachycardia and palpitations. She was resting comfortably in bed and breathing and speaking normally. Stating that the albuterol nebulizer made her breathing feel much better, but now she was feeling her heart pound in her chest. She already received her home diltiazem 24hr 120mg  capsule. At home, she takes diltiazem 60mg  prn for tachycardia/palpitations and stated that if she were at home and felt this way, she would take her extra diltiazem. She is also complaining that she doesn't feel relaxed with all that has been going on and is concerned she won't be able to fall asleep.   Cardiac: HR 120, irregularly irregular  Pulm: speaking full sentences without difficulty. Continues to have decreased air movement throughout but no longer wheezing   A/P: - diltiazem 30mg  once  - ramelteon 8mg 

## 2018-08-29 NOTE — Progress Notes (Signed)
Pt being discharged to home by family. Pts VS stable and in no pain, pt up to ambulate without any complications. Education given to pt and paperwork. Pt understands her medication regimen and plans for home.

## 2018-08-29 NOTE — Progress Notes (Signed)
PT very uncomfortable d/t wheezing and sob. Current O2 is 96% on RA, using accessory muscles to breath, states she is unable to sleep or rest. Had a breathing tx about 2030 and pt stated it did not help. She also stated she is coughing but unable to bring anything up. Respiratory notified and stated they would come put eyes on her. On call doc notified-waiting for any new orders.   On call called back and is coming to floor to see pt.

## 2018-08-30 ENCOUNTER — Encounter: Payer: Self-pay | Admitting: *Deleted

## 2018-08-30 ENCOUNTER — Other Ambulatory Visit: Payer: Self-pay

## 2018-08-30 ENCOUNTER — Other Ambulatory Visit (HOSPITAL_COMMUNITY): Payer: Self-pay | Admitting: Internal Medicine

## 2018-08-30 NOTE — Patient Outreach (Addendum)
[facility]) Care Management  08/30/2018  [name] Oct 16, 1950 [id number]   Initial outreach to patient regarding high priority referral received on 08/29/18 for [employer] coupons and transportation resources. BSW inquired about whether or not Ms. Paulhus has applied for food stamps. She does already receive monthly food stamps. Ms. Vazguez reported that she currently utilizes [employer] transportation for MD appointments however she would like other resources due to limited rides.  BSW educated her about SCAT and Armed forces technical officer.  Ms. Pugmire has Medicaid in addition to [employer] but she was unsure if she has transportation benefits.  BSW encouraged her to contact her caseworker at [employer] to inquire about transportation.  Ms. Linskey declined submitted a [employer] application but did agree to [employer] application for [employer].   BSW will follow up with her next week to ensure receipt of coupons and [employer] application that was mailed today.  Ronn Melena, BSW Social Worker [phone]

## 2018-08-31 ENCOUNTER — Other Ambulatory Visit (HOSPITAL_COMMUNITY): Payer: Self-pay | Admitting: *Deleted

## 2018-08-31 ENCOUNTER — Other Ambulatory Visit: Payer: Self-pay | Admitting: *Deleted

## 2018-08-31 MED ORDER — RIVAROXABAN 20 MG PO TABS: 20.0000 mg | ORAL_TABLET | Freq: Every day | ORAL | 6 refills | Status: DC

## 2018-08-31 MED ORDER — DILTIAZEM HCL ER COATED BEADS 120 MG PO CP24: ORAL_CAPSULE | ORAL | 6 refills | Status: DC

## 2018-08-31 MED ORDER — DILTIAZEM HCL 60 MG PO TABS: 60.0000 mg | ORAL_TABLET | Freq: Four times a day (QID) | ORAL | 3 refills | Status: DC | PRN

## 2018-08-31 NOTE — Telephone Encounter (Signed)
I think she had enough supply. She can address that during her follow-up visit.

## 2018-08-31 NOTE — Patient Outreach (Signed)
[facility]) Care Management  08/31/2018  [name] 1950/09/09 [id number]   Referral received from hospital liaison as member was recently admitted to hospital for asthma exacerbation.  Primary MD office will complete transition of care assessment.  Per chart, she also has history atrial fibrillation, GERD, and hyperlipidemia.  Call placed to member to follow up on discharge, no answer.  HIPAA compliant voice message left, unsuccessful outreach letter sent.  Will follow up within the next 4 business days.  [name], [place], MSN [place] [phone]

## 2018-09-01 ENCOUNTER — Telehealth: Payer: Self-pay | Admitting: Internal Medicine

## 2018-09-01 NOTE — Telephone Encounter (Signed)
Pt would like nebulizer machine instead the cpap. Please call pt back.

## 2018-09-01 NOTE — Telephone Encounter (Signed)
Pt C-pap machine isn't working, pt contact [phone]

## 2018-09-02 ENCOUNTER — Other Ambulatory Visit: Payer: Self-pay

## 2018-09-02 ENCOUNTER — Emergency Department (HOSPITAL_COMMUNITY)
Admission: EM | Admit: 2018-09-02 | Discharge: 2018-09-02 | Disposition: A | Discharge status: Home / Self Care | Payer: Medicare Other | Attending: Emergency Medicine | Admitting: Emergency Medicine

## 2018-09-02 ENCOUNTER — Emergency Department (HOSPITAL_COMMUNITY): Payer: Medicare Other

## 2018-09-02 DIAGNOSIS — J4520 Mild intermittent asthma, uncomplicated: Secondary | ICD-10-CM | POA: Diagnosis not present

## 2018-09-02 DIAGNOSIS — J45909 Unspecified asthma, uncomplicated: Secondary | ICD-10-CM | POA: Diagnosis not present

## 2018-09-02 DIAGNOSIS — I10 Essential (primary) hypertension: Secondary | ICD-10-CM | POA: Diagnosis not present

## 2018-09-02 DIAGNOSIS — R0602 Shortness of breath: Secondary | ICD-10-CM | POA: Diagnosis not present

## 2018-09-02 DIAGNOSIS — Z87891 Personal history of nicotine dependence: Secondary | ICD-10-CM | POA: Insufficient documentation

## 2018-09-02 DIAGNOSIS — J45901 Unspecified asthma with (acute) exacerbation: Secondary | ICD-10-CM | POA: Diagnosis not present

## 2018-09-02 DIAGNOSIS — Z79899 Other long term (current) drug therapy: Secondary | ICD-10-CM | POA: Insufficient documentation

## 2018-09-02 DIAGNOSIS — R000 Tachycardia, unspecified: Secondary | ICD-10-CM | POA: Diagnosis not present

## 2018-09-02 LAB — CBC
HCT: 42.4 % (ref 36.0–46.0)
Hemoglobin: 13.8 g/dL (ref 12.0–15.0)
MCH: 26.7 pg (ref 26.0–34.0)
MCHC: 32.5 g/dL (ref 30.0–36.0)
MCV: 82.2 fL (ref 80.0–100.0)
Platelets: 267 10*3/uL (ref 150–400)
RBC: 5.16 MIL/uL — ABNORMAL HIGH (ref 3.87–5.11)
RDW: 15.8 % — ABNORMAL HIGH (ref 11.5–15.5)
WBC: 5.6 10*3/uL (ref 4.0–10.5)
nRBC: 0 % (ref 0.0–0.2)

## 2018-09-02 LAB — I-STAT TROPONIN, ED: Troponin i, poc: 0 ng/mL (ref 0.00–0.08)

## 2018-09-02 LAB — BASIC METABOLIC PANEL
Anion gap: 11 (ref 5–15)
BUN: 8 mg/dL (ref 8–23)
CALCIUM: 9.7 mg/dL (ref 8.9–10.3)
CO2: 22 mmol/L (ref 22–32)
CREATININE: 0.92 mg/dL (ref 0.44–1.00)
Chloride: 108 mmol/L (ref 98–111)
GFR calc Af Amer: >60 mL/min (ref >60)
GFR calc non Af Amer: >60 mL/min (ref >60)
Glucose, Bld: 106 mg/dL — ABNORMAL HIGH (ref 70–99)
Potassium: 3.6 mmol/L (ref 3.5–5.1)
Sodium: 141 mmol/L (ref 135–145)

## 2018-09-02 MED ORDER — LEVALBUTEROL HCL 1.25 MG/0.5ML IN NEBU
1.2500 mg | INHALATION_SOLUTION | Freq: Once | RESPIRATORY_TRACT | Status: AC
  Administered 2018-09-02: 1.25 mg via RESPIRATORY_TRACT

## 2018-09-02 NOTE — ED Provider Notes (Signed)
[name] EMERGENCY DEPARTMENT Provider Note   CSN: [id number] Arrival date & time: 09/02/18  7341     History   Chief Complaint No chief complaint on file.   HPI [name] is a 68 y.o. female.  Patient presenting with shortness of breath.  Patient was seen and admitted January 6 discharged home January 7.  For asthma.  Main problem is is that her nebulizer machine does not work properly because she put the wrong type of medicine in it inadvertently.  Patient needs a new nebulizer machine.  No acute distress here.  If we can get a new nebulizer machine patient is stable for discharge home.     Past Medical History:  Diagnosis Date  . A-fib ([place])   . Allergy    seasonal  . Asthma   . Chronic anticoagulation   . Eczema   . GERD (gastroesophageal reflux disease)   . Hypertension   . PAF (paroxysmal atrial fibrillation) ([place]) 11/26/2015   April/May 2017: Multiple ED visits for A. Fib.  02/12/16: Improved on diltiazem IV. Cardiology consulted in the ED and discharge patient with diltiazem XR 120 mg once daily with 60 mg by mouth every 6 hours as needed for symptomatic palpitations, flecainide 50 mg twice daily. Referred to the atrial fibrillation clinic.  02/17/16: Seen in the atrial fibrillation clinic by NP Roderic Palau. Normal sin    Patient Active Problem List   Diagnosis Date Noted  . History of radiofrequency ablation (RFA) procedure for cardiac arrhythmia   . Asthma exacerbation 08/28/2018  . Nasal congestion 06/14/2018  . Primary osteoarthritis involving multiple joints 06/14/2018  . Acute pain of left knee 05/15/2018  . Hyperpigmented skin lesion 09/09/2017  . Osteoporosis without current pathological fracture 04/15/2017  . Normocytic anemia 10/19/2016  . Hyperlipidemia 03/18/2016  . Special screening for malignant neoplasms, colon 03/17/2016  . Eczema 03/17/2016  . Cervical cancer screening 03/17/2016  . Assistance with transportation  03/17/2016  . HTN (hypertension), benign   . GERD (gastroesophageal reflux disease)   . PAF (paroxysmal atrial fibrillation) ([place]) 11/26/2015  . Chronic anticoagulation 11/26/2015  . Marijuana abuse 06/11/2015  . Asthma, mild intermittent     Past Surgical History:  Procedure Laterality Date  . ATRIAL FIBRILLATION ABLATION N/A 09/30/2017   Procedure: ATRIAL FIBRILLATION ABLATION;  Surgeon: Thompson Grayer, MD;  Location: [facility];  Service: Cardiovascular;  Laterality: N/A;  . TONSILLECTOMY    . TUBAL LIGATION       OB History   No obstetric history on file.      Home Medications    Prior to Admission medications   Medication Sig Start Date End Date Taking? Authorizing Provider  acetaminophen (TYLENOL) 500 MG tablet Take 500 mg by mouth daily as needed for headache (pain).   Yes [provider]  cetirizine (ZYRTEC) 10 MG tablet Take 10 mg by mouth daily. 06/22/18  Yes [provider]  dicyclomine (BENTYL) 20 MG tablet Take 1 tablet (20 mg total) by mouth 2 (two) times daily. Patient taking differently: Take 20 mg by mouth every other day.  01/25/18  Yes Carlisle Cater, PA-C  diltiazem (CARDIZEM) 60 MG tablet Take 1 tablet (60 mg total) by mouth every 6 (six) hours as needed (for rapid afib heart rate over 100). 08/31/18  Yes Sherran Needs, NP  diltiazem (CARTIA XT) 120 MG 24 hr capsule TAKE 1 CAPSULE(120 MG) BY MOUTH EVERY EVENING 08/31/18  Yes Sherran Needs, NP  esomeprazole (NEXIUM) 20 MG capsule Take 2 capsules (40 mg total) by mouth daily. Patient taking differently: Take 40 mg by mouth every other day.  06/13/18 12/10/18 Yes Bloomfield, Carley D, DO  fluticasone (FLOVENT HFA) 44 MCG/ACT inhaler Inhale 1 puff into the lungs 2 (two) times daily.   Yes [provider]  levalbuterol (XOPENEX HFA) 45 MCG/ACT inhaler Inhale 1 puff into the lungs every 6 (six) hours as needed for shortness of breath. 11/21/17  Yes Carmin Muskrat, MD  losartan  (COZAAR) 50 MG tablet Take 50 mg by mouth daily.   Yes [provider]  magnesium hydroxide (MILK OF MAGNESIA) 400 MG/5ML suspension Take 15-30 mLs by mouth at bedtime as needed for mild constipation. 11/01/17  Yes Harris, Abigail, PA-C  montelukast (SINGULAIR) 10 MG tablet Take 1 tablet (10 mg total) by mouth at bedtime. 05/16/18  Yes [name], MD  Multiple Vitamin (MULTIVITAMIN WITH MINERALS) TABS tablet Take 1 tablet by mouth every other day.   Yes [provider]  polyethylene glycol powder (GLYCOLAX/MIRALAX) powder Mix 17g of Miralax in 8 ounces of water every other day Patient taking differently: Take 17 g by mouth daily as needed for mild constipation.  07/20/17  Yes [name], MD  potassium chloride (K-DUR,KLOR-CON) 10 MEQ tablet Take 1 tablet (10 mEq total) by mouth 2 (two) times daily. 03/08/18  Yes Sherran Needs, NP  rivaroxaban (XARELTO) 20 MG TABS tablet Take 1 tablet (20 mg total) by mouth daily with supper. 08/31/18  Yes Sherran Needs, NP  tiotropium (SPIRIVA) 18 MCG inhalation capsule Place 1 capsule (18 mcg total) into inhaler and inhale daily. 05/26/18  Yes [name], MD  triamcinolone cream (KENALOG) 0.1 % Apply 1 application topically 2 (two) times daily. Patient taking differently: Apply 1 application topically 2 (two) times daily as needed (rash/itching).  05/06/18  Yes Henderly, Britni A, PA-C  diclofenac sodium (VOLTAREN) 1 % GEL Apply 2 g topically 4 (four) times daily. Patient not taking: Reported on 08/28/2018 06/13/18   Modena Nunnery D, DO  levalbuterol (XOPENEX) 1.25 MG/0.5ML nebulizer solution Take 1.25 mg by nebulization every 6 (six) hours as needed for up to 7 days for wheezing or shortness of breath. 08/29/18 09/05/18  Rehman, Areeg N, DO  levocetirizine (XYZAL) 5 MG tablet Take 1 tablet (5 mg total) by mouth every evening. Patient not taking: Reported on 08/28/2018 06/03/18   Carmin Muskrat, MD  linaclotide [facility]) 145 MCG CAPS  capsule Take 1 capsule (145 mcg total) by mouth daily before breakfast. Patient not taking: Reported on 06/03/2018 02/10/18   Zehr, Laban Emperor, PA-C  predniSONE (DELTASONE) 20 MG tablet Take 2 tablets (40 mg total) by mouth daily with breakfast. Patient not taking: Reported on 09/02/2018 08/30/18   Rehman, Areeg N, DO  simethicone (GAS-X) 80 MG chewable tablet Chew 1 tablet (80 mg total) by mouth every 6 (six) hours as needed for flatulence. Patient not taking: Reported on 06/26/2018 11/01/17   Margarita Mail, PA-C  spironolactone (ALDACTONE) 25 MG tablet Take 1 tablet (25 mg total) by mouth daily. Patient not taking: Reported on 08/28/2018 06/13/18   Modena Nunnery D, DO  sucralfate (CARAFATE) 1 GM/10ML suspension Take 10 mLs (1 g total) by mouth 4 (four) times daily. Patient not taking: Reported on 08/28/2018 01/30/18 01/30/19  Melanee Spry, MD    Family History Family History  Problem Relation Age of Onset  . Heart attack Mother        [name] age  .  Breast cancer Sister        Late 89s; now s/p mastectomy   . Lung cancer Brother        x 2  . Cancer Sister   . Cancer Sister   . Cancer Sister   . Colon cancer Neg Hx   . Colon polyps Neg Hx   . Esophageal cancer Neg Hx   . Rectal cancer Neg Hx   . Stomach cancer Neg Hx     Social History Social History   Tobacco Use  . Smoking status: Former Smoker    Last attempt to quit: 10/24/2015    Years since quitting: 2.8  . Smokeless tobacco: Never Used  Substance Use Topics  . Alcohol use: No  . Drug use: No    Types: Marijuana    Comment: Smoked marijuana in the past.     Allergies   Albuterol; Celecoxib; and Protonix [pantoprazole sodium]   Review of Systems Review of Systems  Constitutional: Negative for chills and fever.  HENT: Negative for rhinorrhea and sore throat.   Eyes: Negative for visual disturbance.  Respiratory: Positive for cough, shortness of breath and wheezing.   Cardiovascular: Negative for chest pain  and leg swelling.  Gastrointestinal: Negative for abdominal pain, diarrhea, nausea and vomiting.  Genitourinary: Negative for dysuria.  Musculoskeletal: Negative for back pain and neck pain.  Skin: Negative for rash.  Neurological: Negative for dizziness, light-headedness and headaches.  Hematological: Does not bruise/bleed easily.  Psychiatric/Behavioral: Negative for confusion.     Physical Exam Updated Vital Signs BP (!) 140/93   Pulse 80   Temp 98 F (36.7 C) (Oral)   Resp 19   Ht 1.702 m (5\' 7" )   Wt 67 kg   SpO2 99%   BMI 23.12 kg/m   Physical Exam Vitals signs and nursing note reviewed.  Constitutional:      General: She is not in acute distress.    Appearance: She is well-developed.  HENT:     Head: Normocephalic and atraumatic.     Mouth/Throat:     Mouth: Mucous membranes are moist.  Eyes:     Conjunctiva/sclera: Conjunctivae normal.  Neck:     Musculoskeletal: Neck supple.  Cardiovascular:     Rate and Rhythm: Normal rate and regular rhythm.     Heart sounds: No murmur.  Pulmonary:     Effort: Pulmonary effort is normal. No respiratory distress.     Breath sounds: Normal breath sounds. No wheezing.  Abdominal:     Palpations: Abdomen is soft.     Tenderness: There is no abdominal tenderness.  Musculoskeletal: Normal range of motion.        General: No swelling.  Skin:    General: Skin is warm and dry.  Neurological:     General: No focal deficit present.     Mental Status: She is alert and oriented to person, place, and time.      ED Treatments / Results  Labs (all labs ordered are listed, but only abnormal results are displayed) Labs Reviewed  BASIC METABOLIC PANEL - Abnormal; Notable for the following components:      Result Value   Glucose, Bld 106 (*)    All other components within normal limits  CBC - Abnormal; Notable for the following components:   RBC 5.16 (*)    RDW 15.8 (*)    All other components within normal limits  I-STAT  TROPONIN, ED    EKG EKG Interpretation  Date/Time:  Saturday September 02 2018 06:56:31 EST Ventricular Rate:  111 PR Interval:    QRS Duration: 84 QT Interval:  333 QTC Calculation: 453 R Axis:   60 Text Interpretation:  Sinus tachycardia Right atrial enlargement Minimal ST depression, diffuse leads Baseline wander in lead(s) V5 Confirmed by Fredia Sorrow [phone]) on 09/02/2018 7:14:42 AM Also confirmed by Fredia Sorrow [phone]), editor Philomena Doheny [phone])  on 09/02/2018 9:31:52 AM   Radiology Dg Chest 2 View  Result Date: 09/02/2018 CLINICAL DATA:  68 year old female with asthma exacerbation, shortness of breath EXAM: CHEST - 2 VIEW COMPARISON:  Prior chest x-ray 08/28/2018 FINDINGS: The lungs are clear and negative for focal airspace consolidation, pulmonary edema or suspicious pulmonary nodule. Mild pulmonary hyperinflation, similar compared to prior. Minimal bronchitic changes. Atherosclerotic calcifications again visualized in the thoracic aorta. No pleural effusion or pneumothorax. Cardiac and mediastinal contours are within normal limits. No acute fracture or lytic or blastic osseous lesions. The visualized upper abdominal bowel gas pattern is unremarkable. IMPRESSION: Stable mild hyperinflation and chronic bronchitic changes without evidence of acute cardiopulmonary process. Aortic Atherosclerosis (ICD10-170.0) Electronically Signed   By: Jacqulynn Cadet M.D.   On: 09/02/2018 08:29    Procedures Procedures (including critical care time)  Medications Ordered in ED Medications  levalbuterol (XOPENEX) nebulizer solution 1.25 mg (1.25 mg Nebulization Given 09/02/18 0711)     Initial Impression / Assessment and Plan / ED Course  I have reviewed the triage vital signs and the nursing notes.  Pertinent labs & imaging results that were available during my care of the patient were reviewed by me and considered in my medical decision making (see chart for details).    Patient  without any significant wheezing or respiratory problems here.  Chest x-ray negative.  Main problem is that her nebulizer machine broke.  Unable to do her Xopenex treatments.  Case management able to get a new nebulizer machine.  Also the other machine probably broke because the medication was being diluted properly she now has saline to dilute it with.  The important that she contact her primary care doctors on Monday for further follow-up.  Final Clinical Impressions(s) / ED Diagnoses   Final diagnoses:  Uncomplicated asthma, unspecified asthma severity, unspecified whether persistent    ED Discharge Orders    None       Fredia Sorrow, MD 09/19/18 [phone]

## 2018-09-02 NOTE — Care Management (Deleted)
[facility] to check on the status of patient's nebulizer and if she can receive a new machine.  Will update when available.

## 2018-09-02 NOTE — Discharge Instructions (Addendum)
Call your clinic doctors on Monday for follow-up.  Use your new nebulizer machine as directed.  Use the saline mixture to go in with your medication.  Use the nebulizer treatments every 6 hours.  Return for any new or worse symptoms.

## 2018-09-02 NOTE — ED Notes (Signed)
Pt ambulated with steady gait to restroom. Sats dropped to 80s  but bounced right back to 100 % on RA when got back to bed.

## 2018-09-02 NOTE — Care Management (Addendum)
[facility] to check on the status of patient's nebulizer and if she can receive a new machine.  Will update when available.   1150: Pt states nebulizer is 70 yrs old.  DME order placed and [facility] will deliver to ED ASAP.

## 2018-09-02 NOTE — ED Triage Notes (Signed)
Pt states she woke up and could not catch her breath.

## 2018-09-02 NOTE — ED Notes (Signed)
Patient transported to X-ray 

## 2018-09-04 ENCOUNTER — Other Ambulatory Visit: Payer: Self-pay | Admitting: Internal Medicine

## 2018-09-04 IMAGING — DX DG CHEST 2V
2 series · 2 of 2 positions shown · non-contrast
Comparison: 04/24/2017

CLINICAL DATA: Cough times several days.

EXAM:
CHEST  2 VIEW

[chest pa]
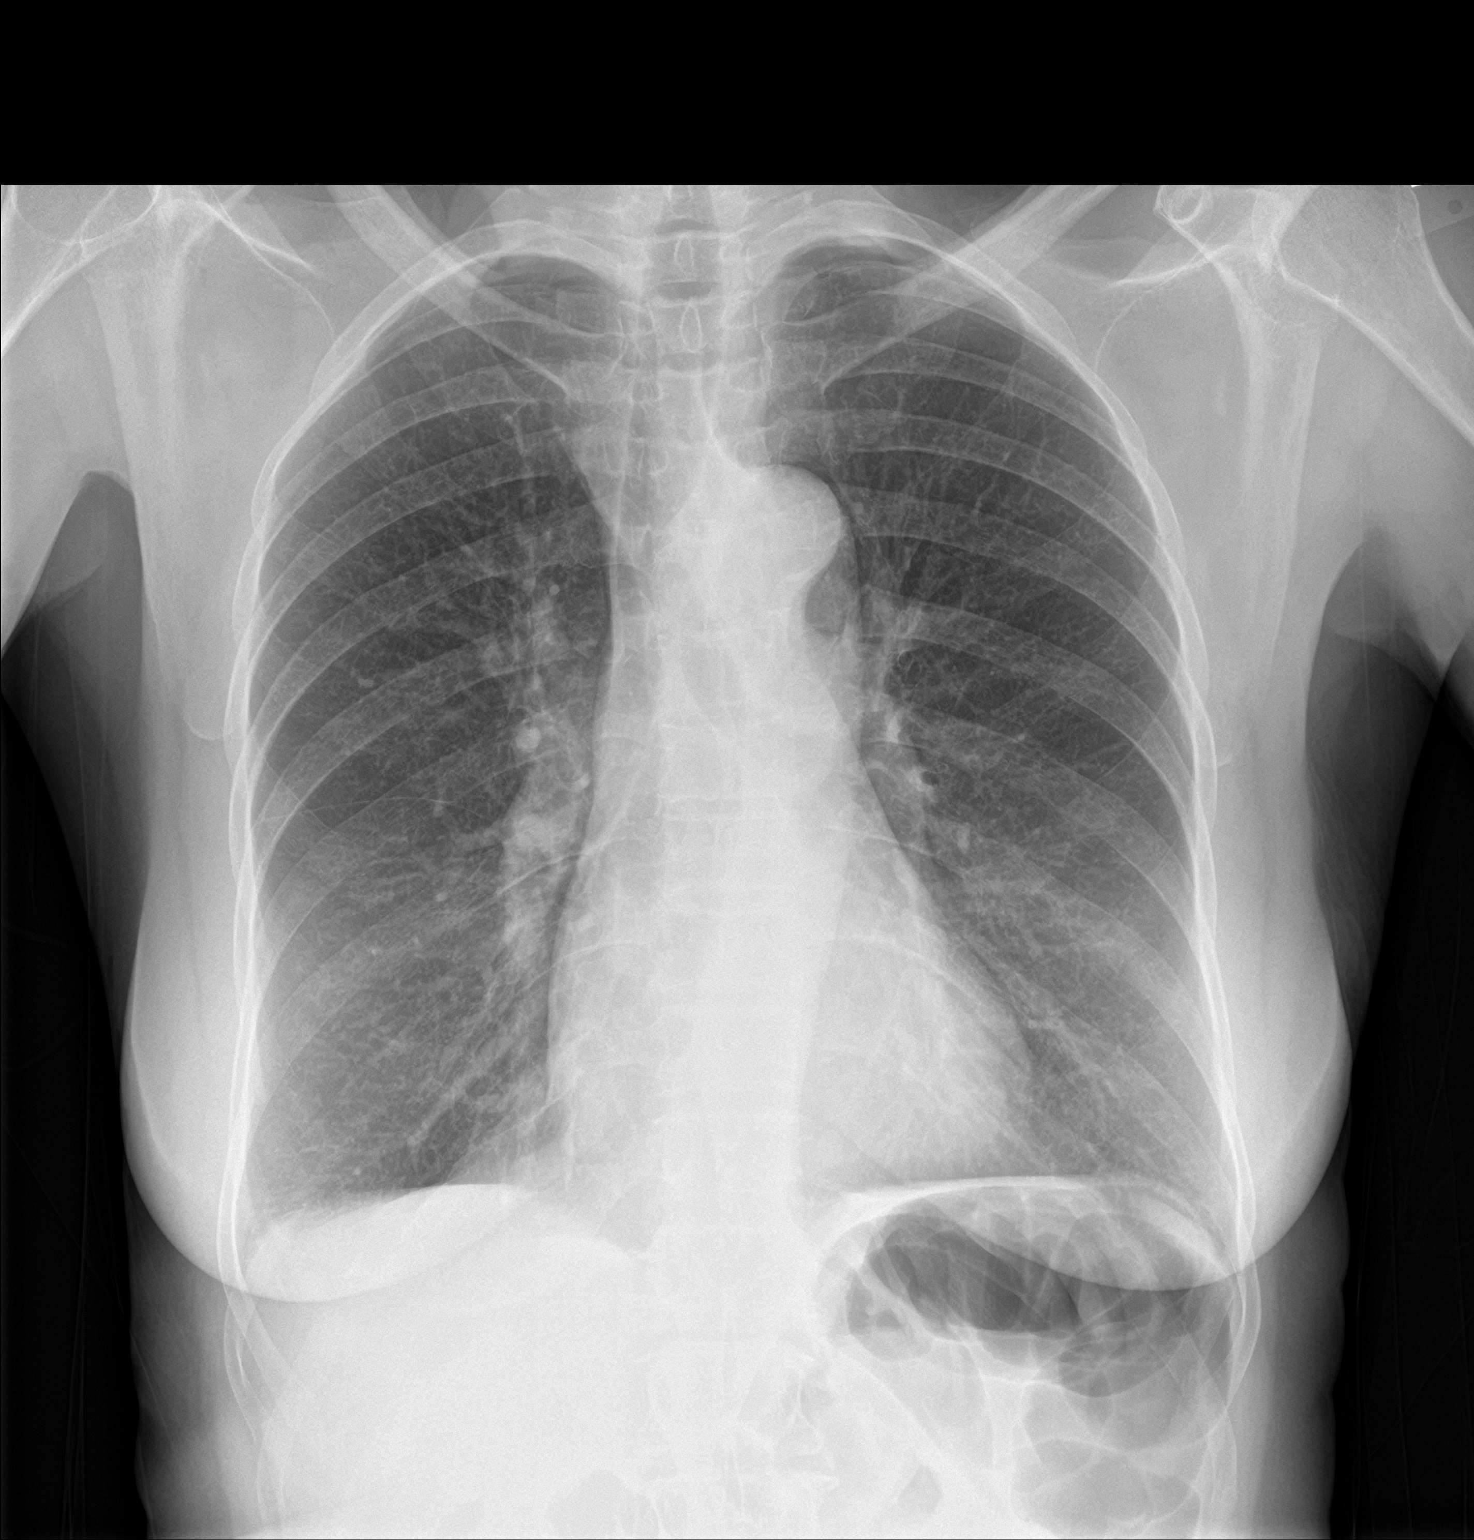

[chest lat]
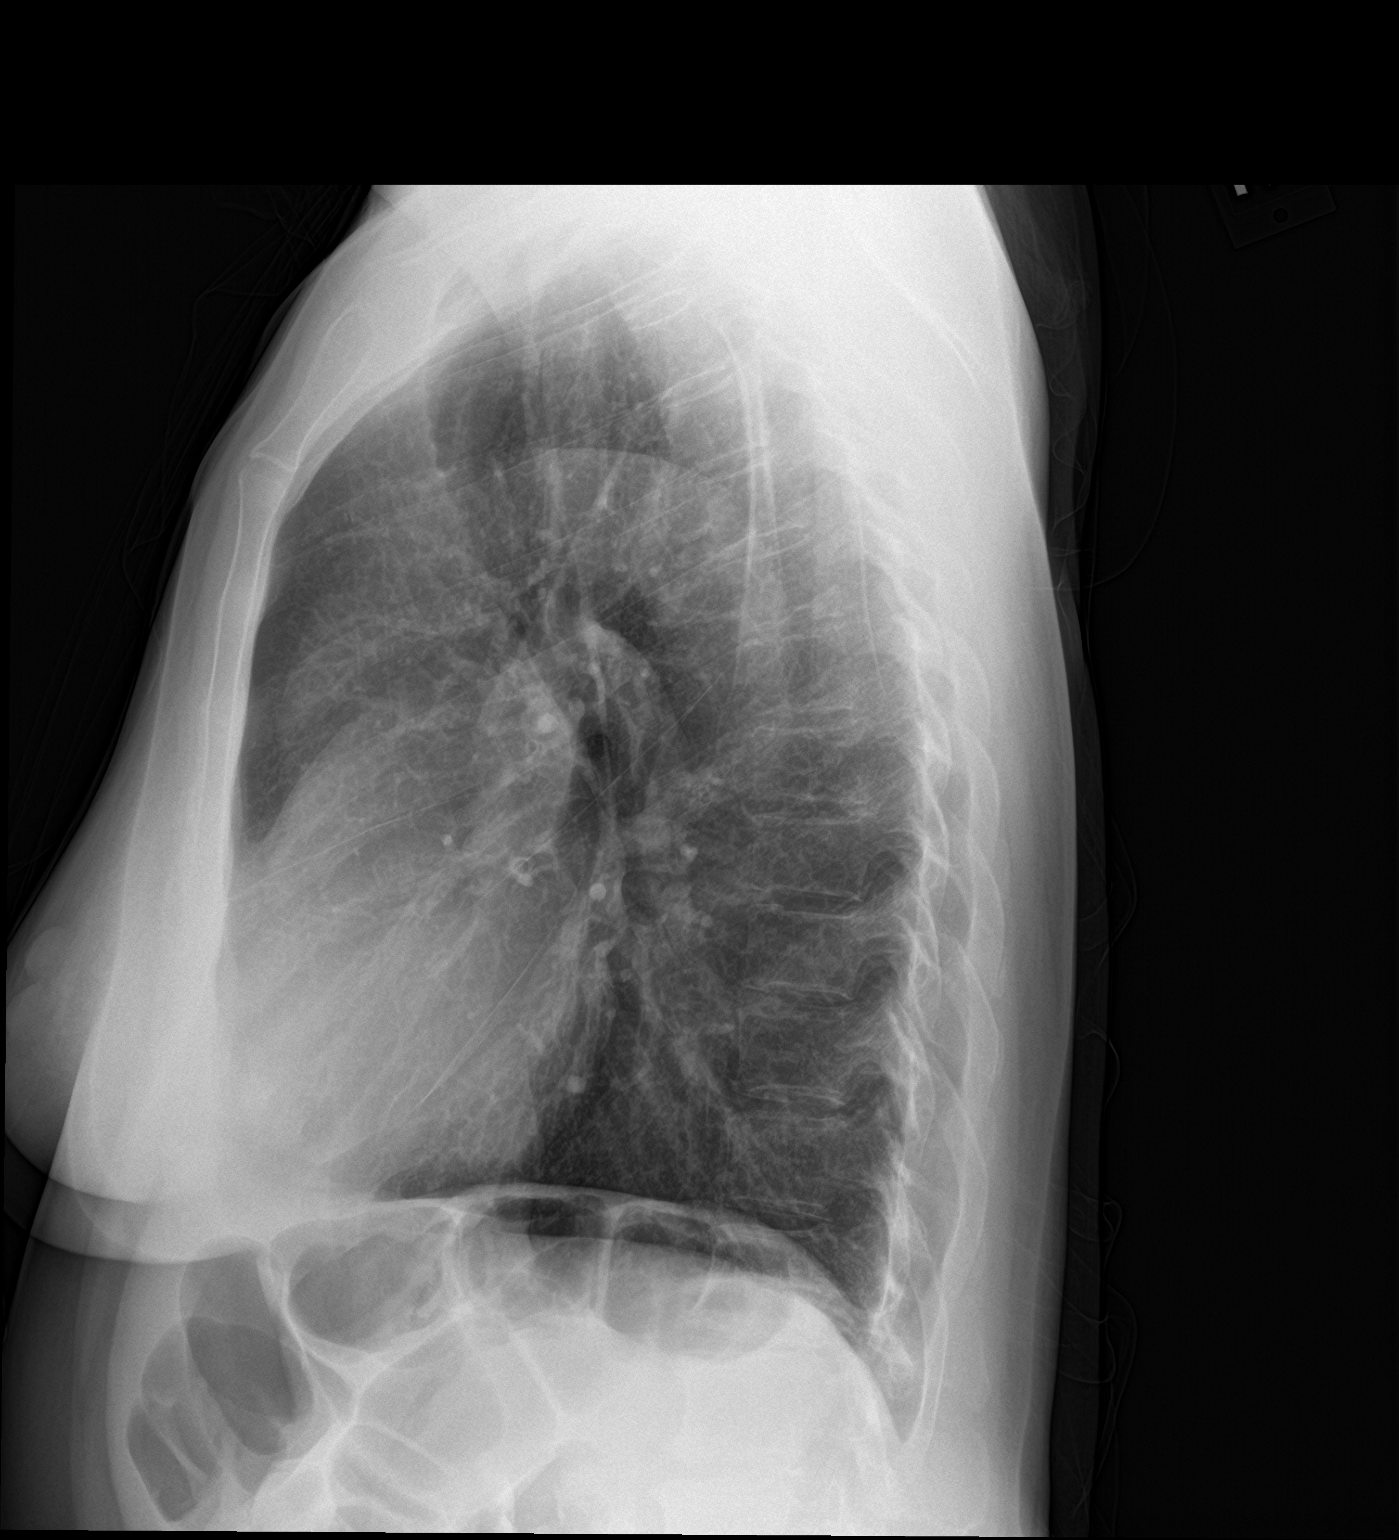

[2 of 2 positions shown; findings below may reference images not displayed]

FINDINGS: Emphysematous hyperinflation of the lungs. The No pneumonic
consolidation. Minimal atelectasis at the lung bases. No effusion or
pulmonary edema. Heart is normal in size. There is minimal uncoiling
of the thoracic aorta with atherosclerosis. No acute nor suspicious
osseous abnormalities.
IMPRESSION: Emphysematous hyperinflation of the lungs.

## 2018-09-04 NOTE — Telephone Encounter (Signed)
Needs refill on losartan (COZAAR) 50 MG tablet at [place], [place] ; pt contact [phone]

## 2018-09-04 NOTE — Telephone Encounter (Signed)
Refill request received from pharmacy via [place] and forwarded to MD.  This encounter closed. SChaplin, RN,BSN

## 2018-09-05 ENCOUNTER — Other Ambulatory Visit: Payer: Self-pay | Admitting: *Deleted

## 2018-09-05 ENCOUNTER — Other Ambulatory Visit: Payer: Self-pay

## 2018-09-05 ENCOUNTER — Encounter (HOSPITAL_COMMUNITY): Payer: Self-pay | Admitting: Emergency Medicine

## 2018-09-05 ENCOUNTER — Emergency Department (HOSPITAL_COMMUNITY): Payer: Medicare Other

## 2018-09-05 ENCOUNTER — Emergency Department (HOSPITAL_COMMUNITY)
Admission: EM | Admit: 2018-09-05 | Discharge: 2018-09-05 | Disposition: A | Discharge status: Home / Self Care | Payer: Medicare Other | Attending: Emergency Medicine | Admitting: Emergency Medicine

## 2018-09-05 DIAGNOSIS — R002 Palpitations: Secondary | ICD-10-CM | POA: Diagnosis not present

## 2018-09-05 DIAGNOSIS — J45909 Unspecified asthma, uncomplicated: Secondary | ICD-10-CM | POA: Insufficient documentation

## 2018-09-05 DIAGNOSIS — R0789 Other chest pain: Secondary | ICD-10-CM | POA: Diagnosis not present

## 2018-09-05 DIAGNOSIS — R079 Chest pain, unspecified: Secondary | ICD-10-CM | POA: Diagnosis not present

## 2018-09-05 DIAGNOSIS — I10 Essential (primary) hypertension: Secondary | ICD-10-CM | POA: Diagnosis not present

## 2018-09-05 DIAGNOSIS — Z79899 Other long term (current) drug therapy: Secondary | ICD-10-CM | POA: Diagnosis not present

## 2018-09-05 DIAGNOSIS — Z87891 Personal history of nicotine dependence: Secondary | ICD-10-CM | POA: Diagnosis not present

## 2018-09-05 LAB — BASIC METABOLIC PANEL
Anion gap: 8 (ref 5–15)
BUN: 7 mg/dL — ABNORMAL LOW (ref 8–23)
CO2: 23 mmol/L (ref 22–32)
Calcium: 9.5 mg/dL (ref 8.9–10.3)
Chloride: 109 mmol/L (ref 98–111)
Creatinine, Ser: 0.71 mg/dL (ref 0.44–1.00)
GFR calc Af Amer: >60 mL/min (ref >60)
GFR calc non Af Amer: >60 mL/min (ref >60)
Glucose, Bld: 112 mg/dL — ABNORMAL HIGH (ref 70–99)
Potassium: 3.5 mmol/L (ref 3.5–5.1)
Sodium: 140 mmol/L (ref 135–145)

## 2018-09-05 LAB — CBC
HCT: 37.2 % (ref 36.0–46.0)
Hemoglobin: 11.6 g/dL — ABNORMAL LOW (ref 12.0–15.0)
MCH: 25.7 pg — ABNORMAL LOW (ref 26.0–34.0)
MCHC: 31.2 g/dL (ref 30.0–36.0)
MCV: 82.5 fL (ref 80.0–100.0)
NRBC: 0 % (ref 0.0–0.2)
Platelets: 235 10*3/uL (ref 150–400)
RBC: 4.51 MIL/uL (ref 3.87–5.11)
RDW: 15.6 % — ABNORMAL HIGH (ref 11.5–15.5)
WBC: 6 10*3/uL (ref 4.0–10.5)

## 2018-09-05 LAB — I-STAT TROPONIN, ED: Troponin i, poc: 0 ng/mL (ref 0.00–0.08)

## 2018-09-05 MED ORDER — ACETAMINOPHEN 325 MG PO TABS
650.0000 mg | ORAL_TABLET | Freq: Once | ORAL | Status: AC
  Administered 2018-09-05: 650 mg via ORAL
  Filled 2018-09-05: qty 2

## 2018-09-05 NOTE — ED Triage Notes (Signed)
Pt with generalized chest pain with tightness for 3 days. Recently admitted for exacerbation of her asthma. Pt is a/o x4.

## 2018-09-05 NOTE — Discharge Instructions (Signed)
Make sure you only use the levoalbuterol rescue inhaler or neb when you feel short of breath or tightness.  You only need to take spiriva or incruse but not both

## 2018-09-05 NOTE — Patient Outreach (Signed)
[facility]) Care Management  09/05/2018  WILLARD MADRIGAL 1950/10/05 [id number]   Call placed to member for telephone assessment, 2nd attempt, unsuccessful.  HIPAA compliant voice message left.  Will await call back, will follow up within the next 4 business days if no call back.  [name], [place], MSN [place] [phone]

## 2018-09-05 NOTE — ED Triage Notes (Signed)
States she felt like her heart was fluttering took her cardizem and it helped but then it fluttered agagin

## 2018-09-05 NOTE — ED Provider Notes (Signed)
[facility] EMERGENCY DEPARTMENT Provider Note   CSN: [id number] Arrival date & time: 09/05/18  1235     History   Chief Complaint Chief Complaint  Patient presents with  . Chest Pain    HPI [name] is a 68 y.o. female.  Patient is a 68 year old female with a history of atrial fibrillation status post ablation on Xarelto, asthma who is presenting today with chest tightness and shortness of breath.  Patient states she was recently hospitalized for her asthma and was discharged home with Incruse and levoalbuterol.  Patient states that since being home she is also taking prednisone.  She has had last night some episodes of feeling palpitations and was concerned she may be in atrial fibrillation and took her diltiazem.  She was able to sleep but when she woke up this morning she also had some palpitations and tightness in her chest.  She did take another Cardizem.  She has had an ongoing cough but denies fever.  She has had no sputum production.  She continues to use the levo albuterol nebulizer as needed.  She states she only had to use it once yesterday.  She has had no unilateral leg pain or swelling, near syncope or dizziness.  The history is provided by the patient.  Chest Pain  Pain location:  Substernal area Pain quality: pressure and tightness   Pain radiates to:  Does not radiate Pain severity:  Moderate Onset quality:  Gradual Timing:  Intermittent Progression:  Waxing and waning Chronicity:  Recurrent Context: breathing   Relieved by:  None tried Worsened by:  Nothing Ineffective treatments:  None tried Associated symptoms: cough, headache, palpitations and shortness of breath   Associated symptoms: no abdominal pain, no altered mental status, no anorexia, no dizziness, no lower extremity edema, no nausea and no weakness   Risk factors: no coronary artery disease   Risk factors comment:  Asthma, paf on xarelto   Past Medical History:   Diagnosis Date  . A-fib ([place])   . Allergy    seasonal  . Asthma   . Chronic anticoagulation   . Eczema   . GERD (gastroesophageal reflux disease)   . Hypertension   . PAF (paroxysmal atrial fibrillation) ([place]) 11/26/2015   April/May 2017: Multiple ED visits for A. Fib.  02/12/16: Improved on diltiazem IV. Cardiology consulted in the ED and discharge patient with diltiazem XR 120 mg once daily with 60 mg by mouth every 6 hours as needed for symptomatic palpitations, flecainide 50 mg twice daily. Referred to the atrial fibrillation clinic.  02/17/16: Seen in the atrial fibrillation clinic by NP Roderic Palau. Normal sin    Patient Active Problem List   Diagnosis Date Noted  . History of radiofrequency ablation (RFA) procedure for cardiac arrhythmia   . Asthma exacerbation 08/28/2018  . Nasal congestion 06/14/2018  . Primary osteoarthritis involving multiple joints 06/14/2018  . Acute pain of left knee 05/15/2018  . Hyperpigmented skin lesion 09/09/2017  . Osteoporosis without current pathological fracture 04/15/2017  . Normocytic anemia 10/19/2016  . Hyperlipidemia 03/18/2016  . Special screening for malignant neoplasms, colon 03/17/2016  . Eczema 03/17/2016  . Cervical cancer screening 03/17/2016  . Assistance with transportation 03/17/2016  . HTN (hypertension), benign   . GERD (gastroesophageal reflux disease)   . PAF (paroxysmal atrial fibrillation) ([place]) 11/26/2015  . Chronic anticoagulation 11/26/2015  . Marijuana abuse 06/11/2015  . Asthma, mild intermittent     Past Surgical History:  Procedure Laterality  Date  . ATRIAL FIBRILLATION ABLATION N/A 09/30/2017   Procedure: ATRIAL FIBRILLATION ABLATION;  Surgeon: Thompson Grayer, MD;  Location: [facility];  Service: Cardiovascular;  Laterality: N/A;  . TONSILLECTOMY    . TUBAL LIGATION       OB History   No obstetric history on file.      Home Medications    Prior to Admission medications   Medication Sig Start  Date End Date Taking? Authorizing Provider  acetaminophen (TYLENOL) 500 MG tablet Take 500 mg by mouth daily as needed for headache (pain).   Yes [provider]  cetirizine (ZYRTEC) 10 MG tablet Take 10 mg by mouth daily. 06/22/18  Yes [provider]  dicyclomine (BENTYL) 20 MG tablet Take 1 tablet (20 mg total) by mouth 2 (two) times daily. Patient taking differently: Take 20 mg by mouth every other day.  01/25/18  Yes Carlisle Cater, PA-C  diltiazem (CARDIZEM) 60 MG tablet Take 1 tablet (60 mg total) by mouth every 6 (six) hours as needed (for rapid afib heart rate over 100). 08/31/18  Yes Sherran Needs, NP  diltiazem (CARTIA XT) 120 MG 24 hr capsule TAKE 1 CAPSULE(120 MG) BY MOUTH EVERY EVENING 08/31/18  Yes Sherran Needs, NP  esomeprazole (NEXIUM) 20 MG capsule Take 2 capsules (40 mg total) by mouth daily. Patient taking differently: Take 40 mg by mouth every other day.  06/13/18 12/10/18 Yes Bloomfield, Carley D, DO  fluticasone (FLOVENT HFA) 44 MCG/ACT inhaler Inhale 1 puff into the lungs 2 (two) times daily.   Yes [provider]  levalbuterol (XOPENEX HFA) 45 MCG/ACT inhaler Inhale 1 puff into the lungs every 6 (six) hours as needed for shortness of breath. 11/21/17  Yes Carmin Muskrat, MD  levalbuterol Penne Lash) 1.25 MG/0.5ML nebulizer solution Take 1.25 mg by nebulization every 6 (six) hours as needed for up to 7 days for wheezing or shortness of breath. 08/29/18 09/05/18 Yes Rehman, Areeg N, DO  losartan (COZAAR) 50 MG tablet TAKE 1 TABLET(50 MG) BY MOUTH DAILY Patient taking differently: Take 50 mg by mouth daily.  09/04/18  Yes [name], MD  magnesium hydroxide (MILK OF MAGNESIA) 400 MG/5ML suspension Take 15-30 mLs by mouth at bedtime as needed for mild constipation. 11/01/17  Yes Harris, Abigail, PA-C  montelukast (SINGULAIR) 10 MG tablet Take 1 tablet (10 mg total) by mouth at bedtime. 05/16/18  Yes [name], MD  Multiple Vitamin (MULTIVITAMIN WITH  MINERALS) TABS tablet Take 1 tablet by mouth every other day.   Yes [provider]  polyethylene glycol powder (GLYCOLAX/MIRALAX) powder Mix 17g of Miralax in 8 ounces of water every other day Patient taking differently: Take 17 g by mouth daily as needed for mild constipation.  07/20/17  Yes [name], MD  potassium chloride (K-DUR,KLOR-CON) 10 MEQ tablet Take 1 tablet (10 mEq total) by mouth 2 (two) times daily. 03/08/18  Yes Sherran Needs, NP  rivaroxaban (XARELTO) 20 MG TABS tablet Take 1 tablet (20 mg total) by mouth daily with supper. 08/31/18  Yes Sherran Needs, NP  tiotropium (SPIRIVA) 18 MCG inhalation capsule Place 1 capsule (18 mcg total) into inhaler and inhale daily. 05/26/18  Yes [name], MD  triamcinolone cream (KENALOG) 0.1 % Apply 1 application topically 2 (two) times daily. Patient taking differently: Apply 1 application topically 2 (two) times daily as needed (rash/itching).  05/06/18  Yes Henderly, Britni A, PA-C  diclofenac sodium (VOLTAREN) 1 % GEL Apply 2 g topically  4 (four) times daily. Patient not taking: Reported on 08/28/2018 06/13/18   Modena Nunnery D, DO  levocetirizine (XYZAL) 5 MG tablet Take 1 tablet (5 mg total) by mouth every evening. Patient not taking: Reported on 08/28/2018 06/03/18   Carmin Muskrat, MD  linaclotide [facility]) 145 MCG CAPS capsule Take 1 capsule (145 mcg total) by mouth daily before breakfast. Patient not taking: Reported on 06/03/2018 02/10/18   Zehr, Laban Emperor, PA-C  predniSONE (DELTASONE) 20 MG tablet Take 2 tablets (40 mg total) by mouth daily with breakfast. Patient not taking: Reported on 09/02/2018 08/30/18   Rehman, Areeg N, DO  simethicone (GAS-X) 80 MG chewable tablet Chew 1 tablet (80 mg total) by mouth every 6 (six) hours as needed for flatulence. Patient not taking: Reported on 06/26/2018 11/01/17   Margarita Mail, PA-C  spironolactone (ALDACTONE) 25 MG tablet Take 1 tablet (25 mg total) by mouth daily. Patient  not taking: Reported on 08/28/2018 06/13/18   Modena Nunnery D, DO  sucralfate (CARAFATE) 1 GM/10ML suspension Take 10 mLs (1 g total) by mouth 4 (four) times daily. Patient not taking: Reported on 08/28/2018 01/30/18 01/30/19  Melanee Spry, MD    Family History Family History  Problem Relation Age of Onset  . Heart attack Mother        [name] age  . Breast cancer Sister        Late 22s; now s/p mastectomy   . Lung cancer Brother        x 2  . Cancer Sister   . Cancer Sister   . Cancer Sister   . Colon cancer Neg Hx   . Colon polyps Neg Hx   . Esophageal cancer Neg Hx   . Rectal cancer Neg Hx   . Stomach cancer Neg Hx     Social History Social History   Tobacco Use  . Smoking status: Former Smoker    Last attempt to quit: 10/24/2015    Years since quitting: 2.8  . Smokeless tobacco: Never Used  Substance Use Topics  . Alcohol use: No  . Drug use: No    Types: Marijuana    Comment: Smoked marijuana in the past.     Allergies   Albuterol; Celecoxib; and Protonix [pantoprazole sodium]   Review of Systems Review of Systems  Respiratory: Positive for cough and shortness of breath.   Cardiovascular: Positive for chest pain and palpitations.  Gastrointestinal: Negative for abdominal pain, anorexia and nausea.  Neurological: Positive for headaches. Negative for dizziness and weakness.  All other systems reviewed and are negative.    Physical Exam Updated Vital Signs BP 132/79 (BP Location: Right Arm)   Pulse 70   Temp 100 F (37.8 C) (Oral)   Resp 16   SpO2 98%   Physical Exam Vitals signs and nursing note reviewed.  Constitutional:      General: She is not in acute distress.    Appearance: She is well-developed.  HENT:     Head: Normocephalic and atraumatic.  Eyes:     Conjunctiva/sclera: Conjunctivae normal.     Pupils: Pupils are equal, round, and reactive to light.  Neck:     Musculoskeletal: Normal range of motion and neck supple.   Cardiovascular:     Rate and Rhythm: Normal rate and regular rhythm.     Heart sounds: No murmur.  Pulmonary:     Effort: Pulmonary effort is normal. No respiratory distress.     Breath sounds: Decreased breath sounds present.  No wheezing or rales.     Comments: Decreased breath sounds throughout but no increased work of breathing Abdominal:     General: There is no distension.     Palpations: Abdomen is soft.     Tenderness: There is no abdominal tenderness. There is no guarding or rebound.  Musculoskeletal: Normal range of motion.        General: No tenderness.     Right lower leg: She exhibits no tenderness. No edema.     Left lower leg: She exhibits no tenderness. No edema.  Skin:    General: Skin is warm and dry.     Findings: No erythema or rash.  Neurological:     Mental Status: She is alert and oriented to person, place, and time.  Psychiatric:        Behavior: Behavior normal.      ED Treatments / Results  Labs (all labs ordered are listed, but only abnormal results are displayed) Labs Reviewed  BASIC METABOLIC PANEL - Abnormal; Notable for the following components:      Result Value   Glucose, Bld 112 (*)    BUN 7 (*)    All other components within normal limits  CBC - Abnormal; Notable for the following components:   Hemoglobin 11.6 (*)    MCH 25.7 (*)    RDW 15.6 (*)    All other components within normal limits  I-STAT TROPONIN, ED    EKG EKG Interpretation  Date/Time:  Tuesday September 05 2018 12:53:48 EST Ventricular Rate:  96 PR Interval:  140 QRS Duration: 82 QT Interval:  360 QTC Calculation: 454 R Axis:   65 Text Interpretation:  Normal sinus rhythm Normal ECG No significant change since last tracing Confirmed by Blanchie Dessert [phone]) on 09/05/2018 1:15:15 PM   Radiology Dg Chest 2 View  Result Date: 09/05/2018 CLINICAL DATA:  Central chest pain for 3 days. EXAM: CHEST - 2 VIEW COMPARISON:  PA and lateral chest 09/02/2018 and  02/14/2018. FINDINGS: The chest is hyperexpanded. Minimal subsegmental atelectasis is present in the lung bases. No consolidative process, pneumothorax or effusion. Heart size is normal. Aortic atherosclerosis is noted. No acute or focal bony abnormality. IMPRESSION: No acute disease. Findings compatible with emphysema. Atherosclerosis. Electronically Signed   By: Inge Rise M.D.   On: 09/05/2018 13:59    Procedures Procedures (including critical care time)  Medications Ordered in ED Medications  acetaminophen (TYLENOL) tablet 650 mg (650 mg Oral Given 09/05/18 1359)     Initial Impression / Assessment and Plan / ED Course  I have reviewed the triage vital signs and the nursing notes.  Pertinent labs & imaging results that were available during my care of the patient were reviewed by me and considered in my medical decision making (see chart for details).     Patient presenting today with atypical chest pain and palpitations that started last night.  Patient has a significant history of paroxysmal atrial fibrillation status post ablation as well as asthma.  Patient recently was hospitalized for asthma exacerbation.  She has been using Spiriva, Incruse and levo albuterol.  She denies any infectious symptoms but does have a low-grade temperature here of 100.  She is having no abdominal pain, nausea or vomiting.  Low suspicion for PE as patient has no unilateral leg pain or swelling and does take Xarelto for her paroxysmal atrial fibrillation.  Patient is in sinus rhythm today that is rates of 70-80.  Chest x-ray without acute findings  and labs are reassuring.  Troponin is negative.  Low suspicion for ACS.  Suspect palpitations may be related to the prednisone and inhalers she is using for her asthma.  She is also been using Incruse and Spiriva and discussed with her she only needs to use 1 of those as they are the same medication.  Also she has been using her rescue inhaler and the nebulizer  which also could be causing palpitations.  Reassured the patient that her other labs look normal.  She has follow-up with her doctor in 2 weeks.  She will return to the ER for worsening symptoms.  Oxygen saturation is within normal limits today and patient is in no distress.  Final Clinical Impressions(s) / ED Diagnoses   Final diagnoses:  Atypical chest pain    ED Discharge Orders    None       Blanchie Dessert, MD 09/05/18 1506

## 2018-09-06 NOTE — Telephone Encounter (Signed)
Patient with hospitalization on 08/28/2018 and 2 ED visits since then. Most recent ED note states patient using nebulizer at home. Call placed to patient to discuss. No answer. Left message on VM requesting return call. Hubbard Hartshorn, RN, BSN

## 2018-09-07 ENCOUNTER — Telehealth: Payer: Self-pay | Admitting: *Deleted

## 2018-09-07 ENCOUNTER — Telehealth: Payer: Self-pay

## 2018-09-07 NOTE — Telephone Encounter (Signed)
Thank you :)

## 2018-09-07 NOTE — Telephone Encounter (Signed)
Requesting to speak with a nurse about meds. Please call pt back.  

## 2018-09-07 NOTE — Telephone Encounter (Signed)
Pt called c/o needing a different xopenex prescribed. Called pharmacy, pt does not want to mix xopenex, she doesn't understand directions and cannot see well enough to mix. Pharmacist states the XOPENEX 1.25/3ML NEBULIZER SOLUTION needs to be ordered, can you send a new script and remove the first one?

## 2018-09-07 NOTE — Telephone Encounter (Signed)
Pt called back related to refill Rx.  Pt states she needs Xopenex nebulizer refill.  EDCM advised pt to request refill from PCP as ED will not grant refill without visit.  Pt verbalizes understanding.  No further EDCM needs identified.

## 2018-09-07 NOTE — Telephone Encounter (Signed)
Pt left voicemail requesting Rx refill.  EDCM returned call and left vm that ED does not refill Rx without ED visit.

## 2018-09-08 ENCOUNTER — Other Ambulatory Visit: Payer: Self-pay | Admitting: Internal Medicine

## 2018-09-08 ENCOUNTER — Other Ambulatory Visit: Payer: Self-pay

## 2018-09-08 ENCOUNTER — Encounter: Payer: Self-pay | Admitting: Internal Medicine

## 2018-09-08 ENCOUNTER — Ambulatory Visit (INDEPENDENT_AMBULATORY_CARE_PROVIDER_SITE_OTHER): Payer: Medicare Other | Admitting: Internal Medicine

## 2018-09-08 ENCOUNTER — Other Ambulatory Visit: Payer: Self-pay | Admitting: *Deleted

## 2018-09-08 VITALS — BP 153/75 | HR 84 | Temp 99.1°F | Ht 67.0 in | Wt 152.0 lb

## 2018-09-08 DIAGNOSIS — E876 Hypokalemia: Secondary | ICD-10-CM | POA: Diagnosis not present

## 2018-09-08 DIAGNOSIS — J329 Chronic sinusitis, unspecified: Secondary | ICD-10-CM

## 2018-09-08 DIAGNOSIS — R0981 Nasal congestion: Secondary | ICD-10-CM

## 2018-09-08 MED ORDER — LEVALBUTEROL HCL 1.25 MG/3ML IN NEBU: 1.2500 mg | INHALATION_SOLUTION | RESPIRATORY_TRACT | 2 refills | Status: DC | PRN

## 2018-09-08 MED ORDER — FEXOFENADINE HCL 60 MG PO TABS: 60.0000 mg | ORAL_TABLET | Freq: Two times a day (BID) | ORAL | 1 refills | Status: DC

## 2018-09-08 NOTE — Telephone Encounter (Signed)
Did sent a new prescription at [facility].

## 2018-09-08 NOTE — Telephone Encounter (Signed)
Spoke with patient at today's [facility] visit. She states she received neb/compressor at ED visit. Has no further DME needs at this time. Hubbard Hartshorn, RN, BSN

## 2018-09-08 NOTE — Progress Notes (Signed)
   CC: "cold"  HPI:[name] is a 68 y.o. female who presents for evaluation of cold with persistent nonproductive cough. Please see individual problem based A/P for details.  Past Medical History:  Diagnosis Date  . A-fib ([place])   . Allergy    seasonal  . Asthma   . Chronic anticoagulation   . Eczema   . GERD (gastroesophageal reflux disease)   . Hypertension   . PAF (paroxysmal atrial fibrillation) ([place]) 11/26/2015   April/May 2017: Multiple ED visits for A. Fib.  02/12/16: Improved on diltiazem IV. Cardiology consulted in the ED and discharge patient with diltiazem XR 120 mg once daily with 60 mg by mouth every 6 hours as needed for symptomatic palpitations, flecainide 50 mg twice daily. Referred to the atrial fibrillation clinic.  02/17/16: Seen in the atrial fibrillation clinic by NP Roderic Palau. Normal sin   Review of Systems:  ROS negative except as per HPI.  Physical Exam: Vitals:   09/08/18 1426  BP: (!) 153/75  Pulse: 84  Temp: 99.1 F (37.3 C)  TempSrc: Oral  SpO2: 100%  Weight: 152 lb (68.9 kg)  Height: 5\' 7"  (1.702 m)   General: A/O x4, in no acute distress, afebrile, nondiaphoretic HEENT: PEERL, right tympanic membrane intact with cone of light visible, left membrane intact with cone of light visible following clearing of the cerumen. No tenderness to palpation of the sinuses.  Cardio: RRR, no mrg's Pulmonary: CTA bilaterally  Assessment & Plan:   See Encounters Tab for problem based charting.  Patient discussed with Dr. Daryll Drown

## 2018-09-08 NOTE — Patient Outreach (Signed)
[facility]) Care Management  09/08/2018  [name] 07-04-1951 [id number]   Call received from member yesterday stating she needed help getting the correct nebulizer medication sent to the pharmacy.  She is unable to mix the medication and is requesting to have the pre-mixed solution provided.  Noted today that she has already spoke to the MD office regarding this request and it has been passed forward to the physician.  Call placed to member to provide update and for telephone assessment, no answer (3rd unsuccessful attempt). HIPAA compliant voice message left, will await call back.  If no call back by 1/23, will close case due to inability to [facility] contact.  [name], [place], MSN [place] [phone]

## 2018-09-08 NOTE — Assessment & Plan Note (Signed)
  Hypokalemia: BMP at recent ER visit with potassium low/normal at 3.5. May have been transient and due to displacement given the high dose SABA. She is on a potassium sparing diuretic as well as an ARB. I would recommend a BMP at her next visit and if low, more in depth investigation.

## 2018-09-08 NOTE — Patient Instructions (Signed)
FOLLOW-UP INSTRUCTIONS When: If your symptoms worsen or fail to improve What to bring: All of your medications   I have stopped the Zyrtec and started you on Allegra which may work better for you sinus congestive symptoms. For the discomfort, please take 1000mg  of tylenol 2 times per day and as much as 3 times per day if severe. Do not exceed 3000mg  daily (or six 500mg  tablets).   As always if your symptoms worsen, fail to improve, or you develop other concerning symptoms, please notify our office to schedule an appointment.   Thank you for your visit to the [name] today. If you have any questions or concerns please call us at [phone].

## 2018-09-08 NOTE — Patient Outreach (Signed)
[facility]) Care Management  09/08/2018  [name] [birth year] [id number]   Successful outreach to [name] to ensure receipt of [employer] coupons and [employer] mailed on 04/27/31.  [name] confirmed receipt.  BSW educated her further about Music therapist.  [name] verbalized understanding of application process and how to use service once approved.  BSW is closing case at this time.   Ronn Melena, BSW Social Worker [phone]

## 2018-09-08 NOTE — Assessment & Plan Note (Signed)
Chronic sinus congestion: Chronic sinus congestive sensation. Recurrent issues, relapses and reoccurs. Described mild sinus pressure at times not relieved with low dose tylenol. Due to her other medications she is restricted from NSAIDS use. Has been prescribed amoxicillin in the past for this with almost immediate improvement, but with her stopping treatment abruptly thereafter. She denied fever, chills, nausea, inability to tolerate PO intake, purulent sinus drainage or rhinorrhea.    Plan: Stop other antihistamines. Start allegra  Tylenol 1000mg  TID

## 2018-09-11 ENCOUNTER — Other Ambulatory Visit: Payer: Self-pay | Admitting: Internal Medicine

## 2018-09-14 ENCOUNTER — Other Ambulatory Visit: Payer: Self-pay | Admitting: *Deleted

## 2018-09-14 ENCOUNTER — Telehealth: Payer: Self-pay

## 2018-09-14 IMAGING — CT CT ANGIO CHEST
3 of 7 series · 18 of 36 positions shown · IV contrast (APPLIED)
Comparison: No priors.

CLINICAL DATA: 58-year-old female with history of chest pain and
shortness breath for 1 week.

EXAM:
CT ANGIOGRAPHY CHEST WITH CONTRAST
TECHNIQUE: Multidetector CT imaging of the chest was performed using the
standard protocol during bolus administration of intravenous
contrast. Multiplanar CT image reconstructions and MIPs were
obtained to evaluate the vascular anatomy.
CONTRAST:  100 mL of Isovue 370.

[Series 7: lung · axial · 0.58mm/px · z∈[+1272,+1398]mm · 3 of 158 slices shown]
[im 32/158  mediastinal]
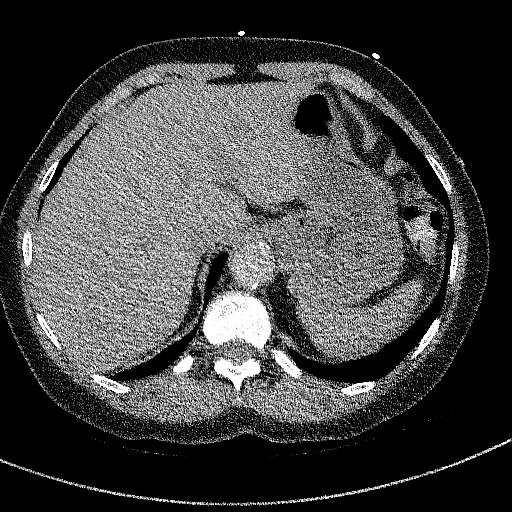
[im 63/158  mediastinal]
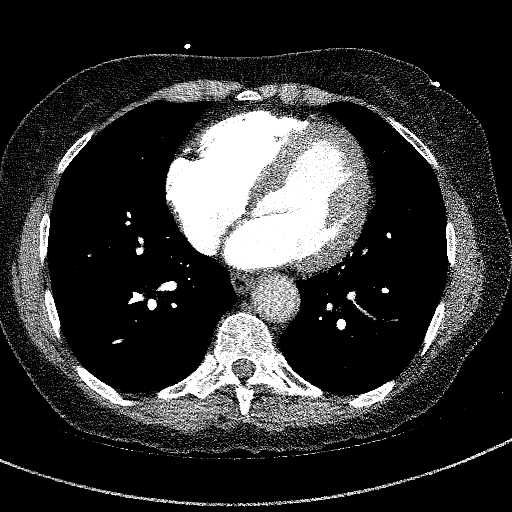
[im 95/158  mediastinal]
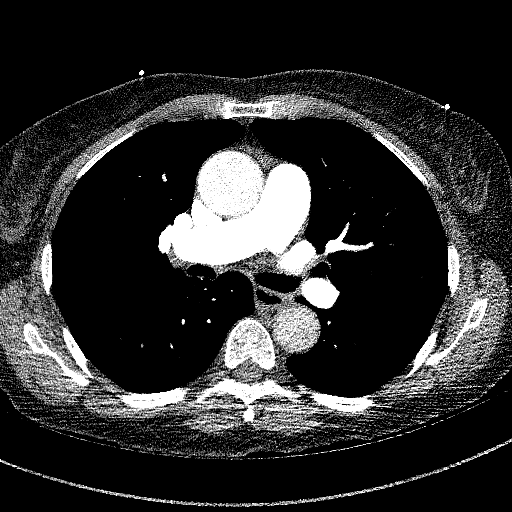

[Series 8: thins · axial · 0.58mm/px · z∈[+1230,+1503]mm · 14 of 449 slices shown]
[im 30/449  lung]
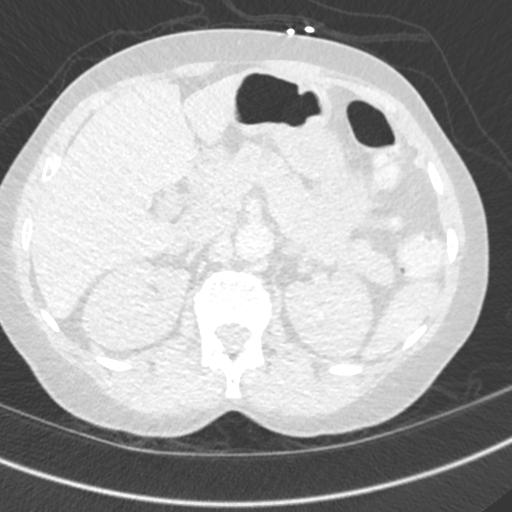
[im 60/449  mediastinal]
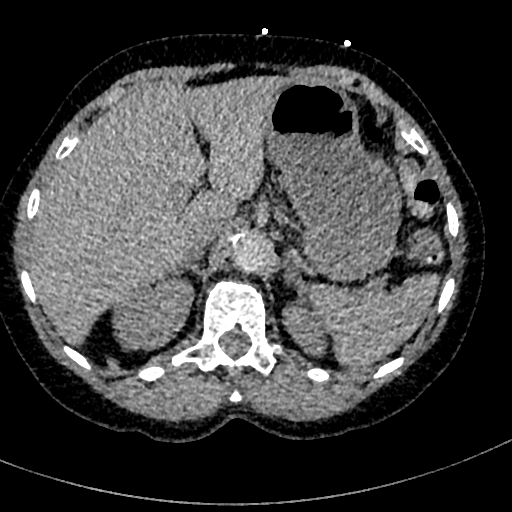
[im 90/449  lung]
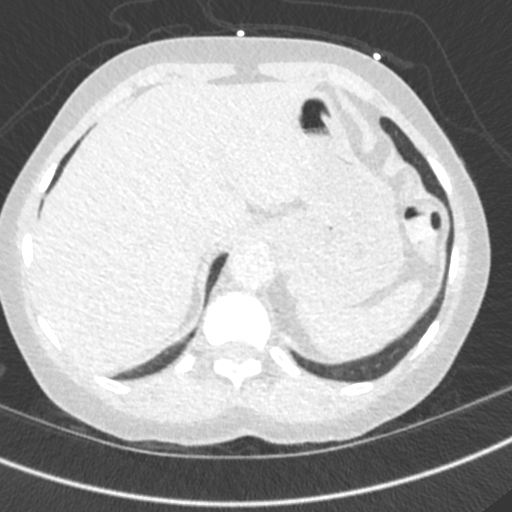
[im 120/449  mediastinal]
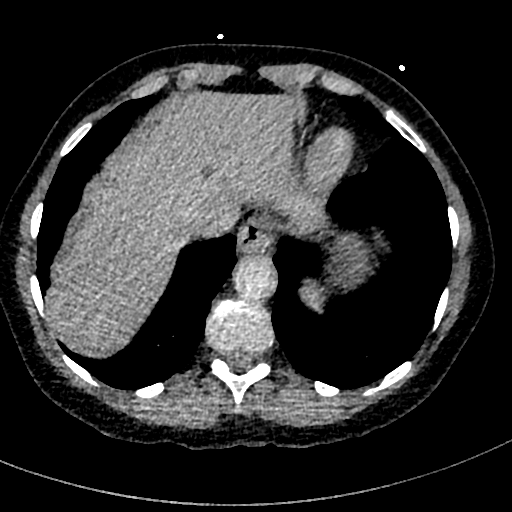
[im 150/449  lung]
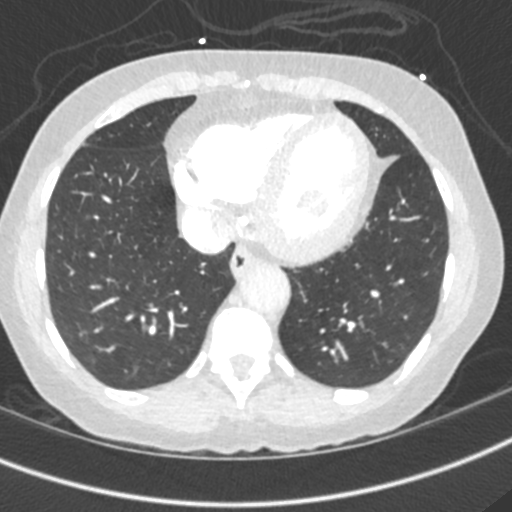
[im 180/449  mediastinal]
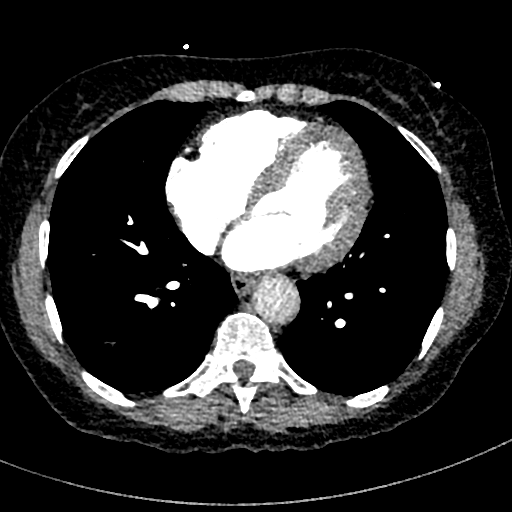
[im 210/449  lung]
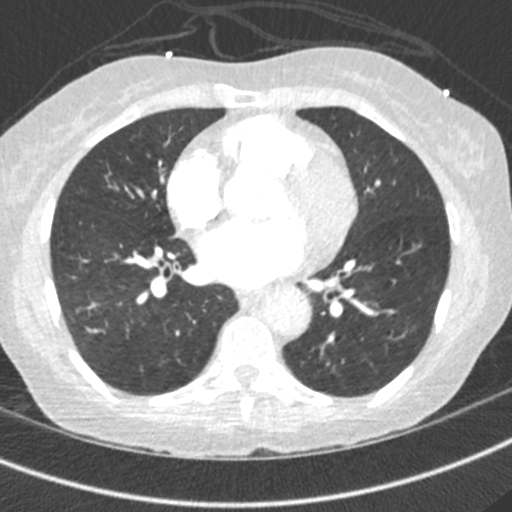
[im 239/449  mediastinal]
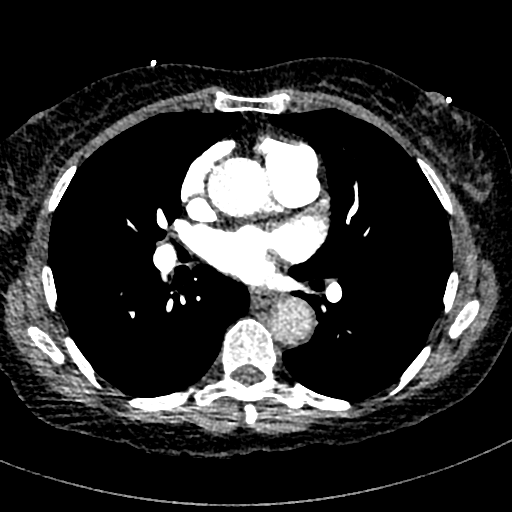
[im 269/449  lung]
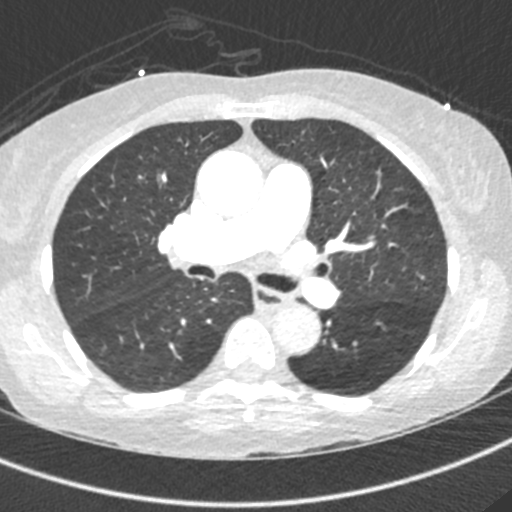
[im 299/449  mediastinal]
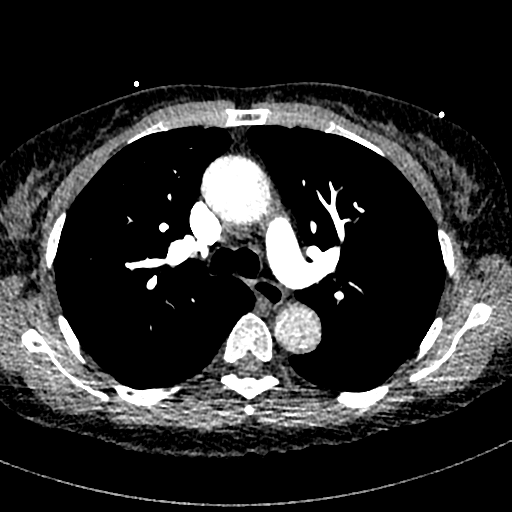
[im 329/449  lung]
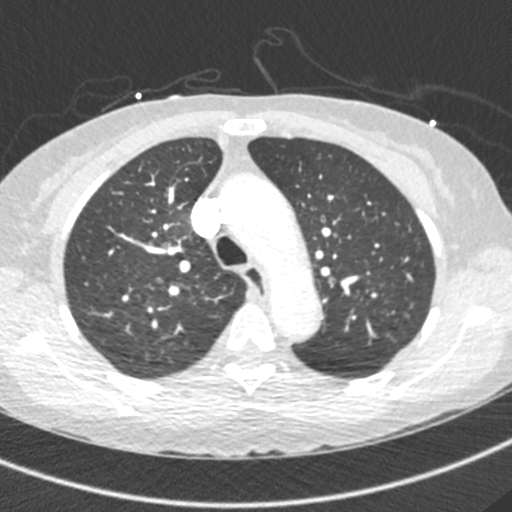
[im 359/449  mediastinal]
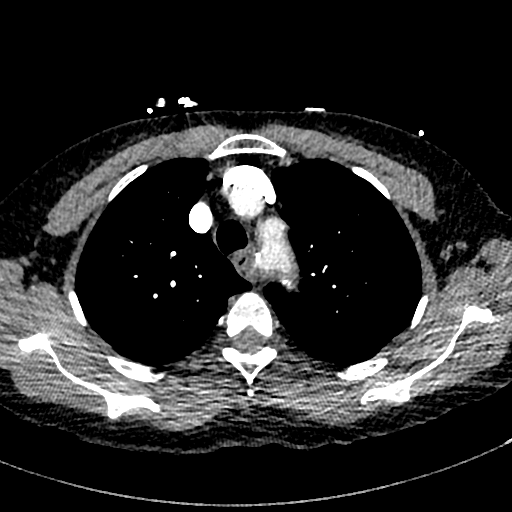
[im 389/449  lung]
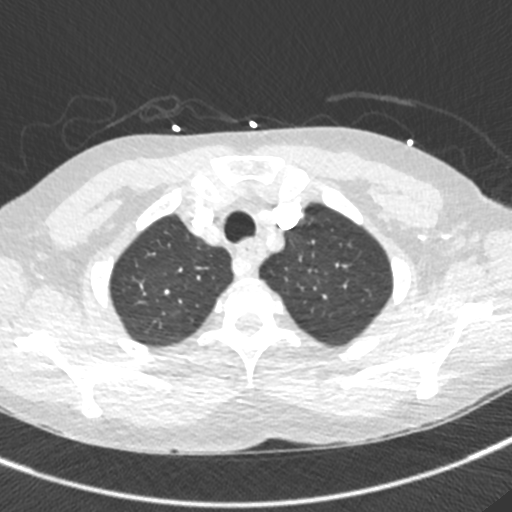
[im 419/449  mediastinal]
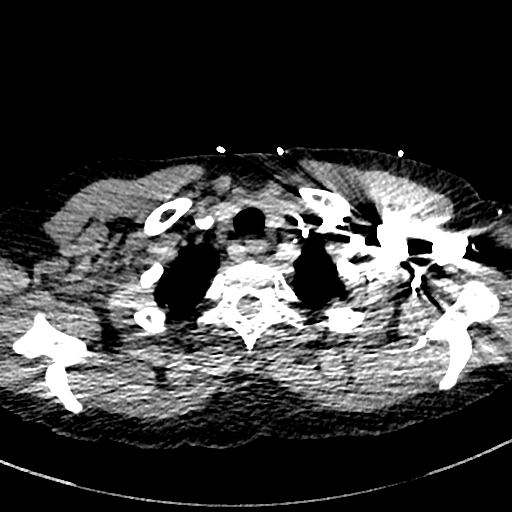

[Series 9: cor · coronal · 0.55mm/px · 1 of 140 slices shown]
[im 70/140  mediastinal]
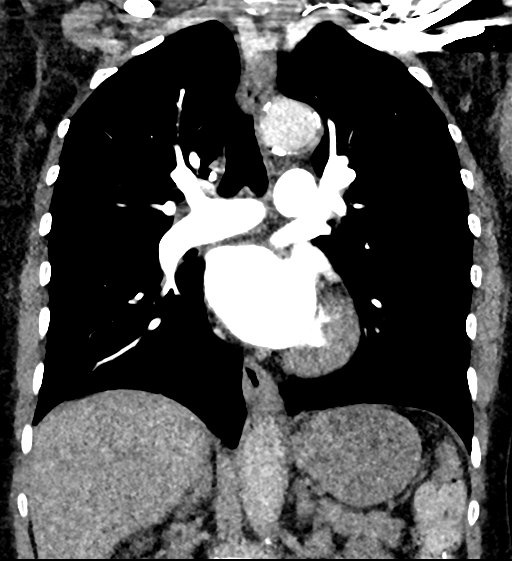

[18 of 36 positions shown; findings below may reference images not displayed]

FINDINGS: Cardiovascular: No filling defect within the pulmonary arterial tree
to suggest underlying pulmonary embolism. Heart size is normal.
There is no significant pericardial fluid, thickening or pericardial
calcification. There is aortic atherosclerosis, as well as
atherosclerosis of the great vessels of the mediastinum and the
coronary arteries, including calcified atherosclerotic plaque in the
left circumflex coronary artery. Aberrant right subclavian artery
(normal anatomical variant) incidentally noted.

Mediastinum/Nodes: No pathologically enlarged mediastinal or hilar
lymph nodes. Esophagus is unremarkable in appearance. No axillary
lymphadenopathy.

Lungs/Pleura: No acute consolidative airspace disease. No pleural
effusions. Mild scarring in the right middle lobe. 5 mm nodule in
the right lower lobe (axial image 97 of series 7). No larger more
suspicious appearing pulmonary nodules or masses are noted.

Upper Abdomen: Low-attenuation lesions in the liver measuring up to
12 mm, compatible with simple cysts. Aortic atherosclerosis.

Musculoskeletal: There are no aggressive appearing lytic or blastic
lesions noted in the visualized portions of the skeleton.

Review of the MIP images confirms the above findings.
IMPRESSION: 1. No evidence pulmonary embolism.
2. No acute findings in the thorax to account for the patient's
symptoms.
3. 5 mm pulmonary nodule in the right lower lobe. No follow-up
needed if patient is low-risk. Non-contrast chest CT can be
considered in 12 months if patient is high-risk. This recommendation
follows the consensus statement: Guidelines for Management of
Incidental Pulmonary Nodules Detected on CT Images: From the
4. Aortic atherosclerosis, in addition to left circumflex coronary
artery disease. Please note that although the presence of coronary
artery calcium documents the presence of coronary artery disease,
the severity of this disease and any potential stenosis cannot be
assessed on this non-gated CT examination. Assessment for potential
risk factor modification, dietary therapy or pharmacologic therapy
may be warranted, if clinically indicated.

Aortic Atherosclerosis ([id number]-LN3.3).

## 2018-09-14 IMAGING — CR DG CHEST 2V
2 series · 2 of 2 positions shown · non-contrast
Comparison: 05/19/2017

CLINICAL DATA: Pt reports dull right side chest discomfort under
breast for over the past week. Reports pain is causing her to have
shortness of breath and needing to use her inhaler more frequently.
History of asthma. Non-smoker

EXAM:
CHEST  2 VIEW

[chest pa]
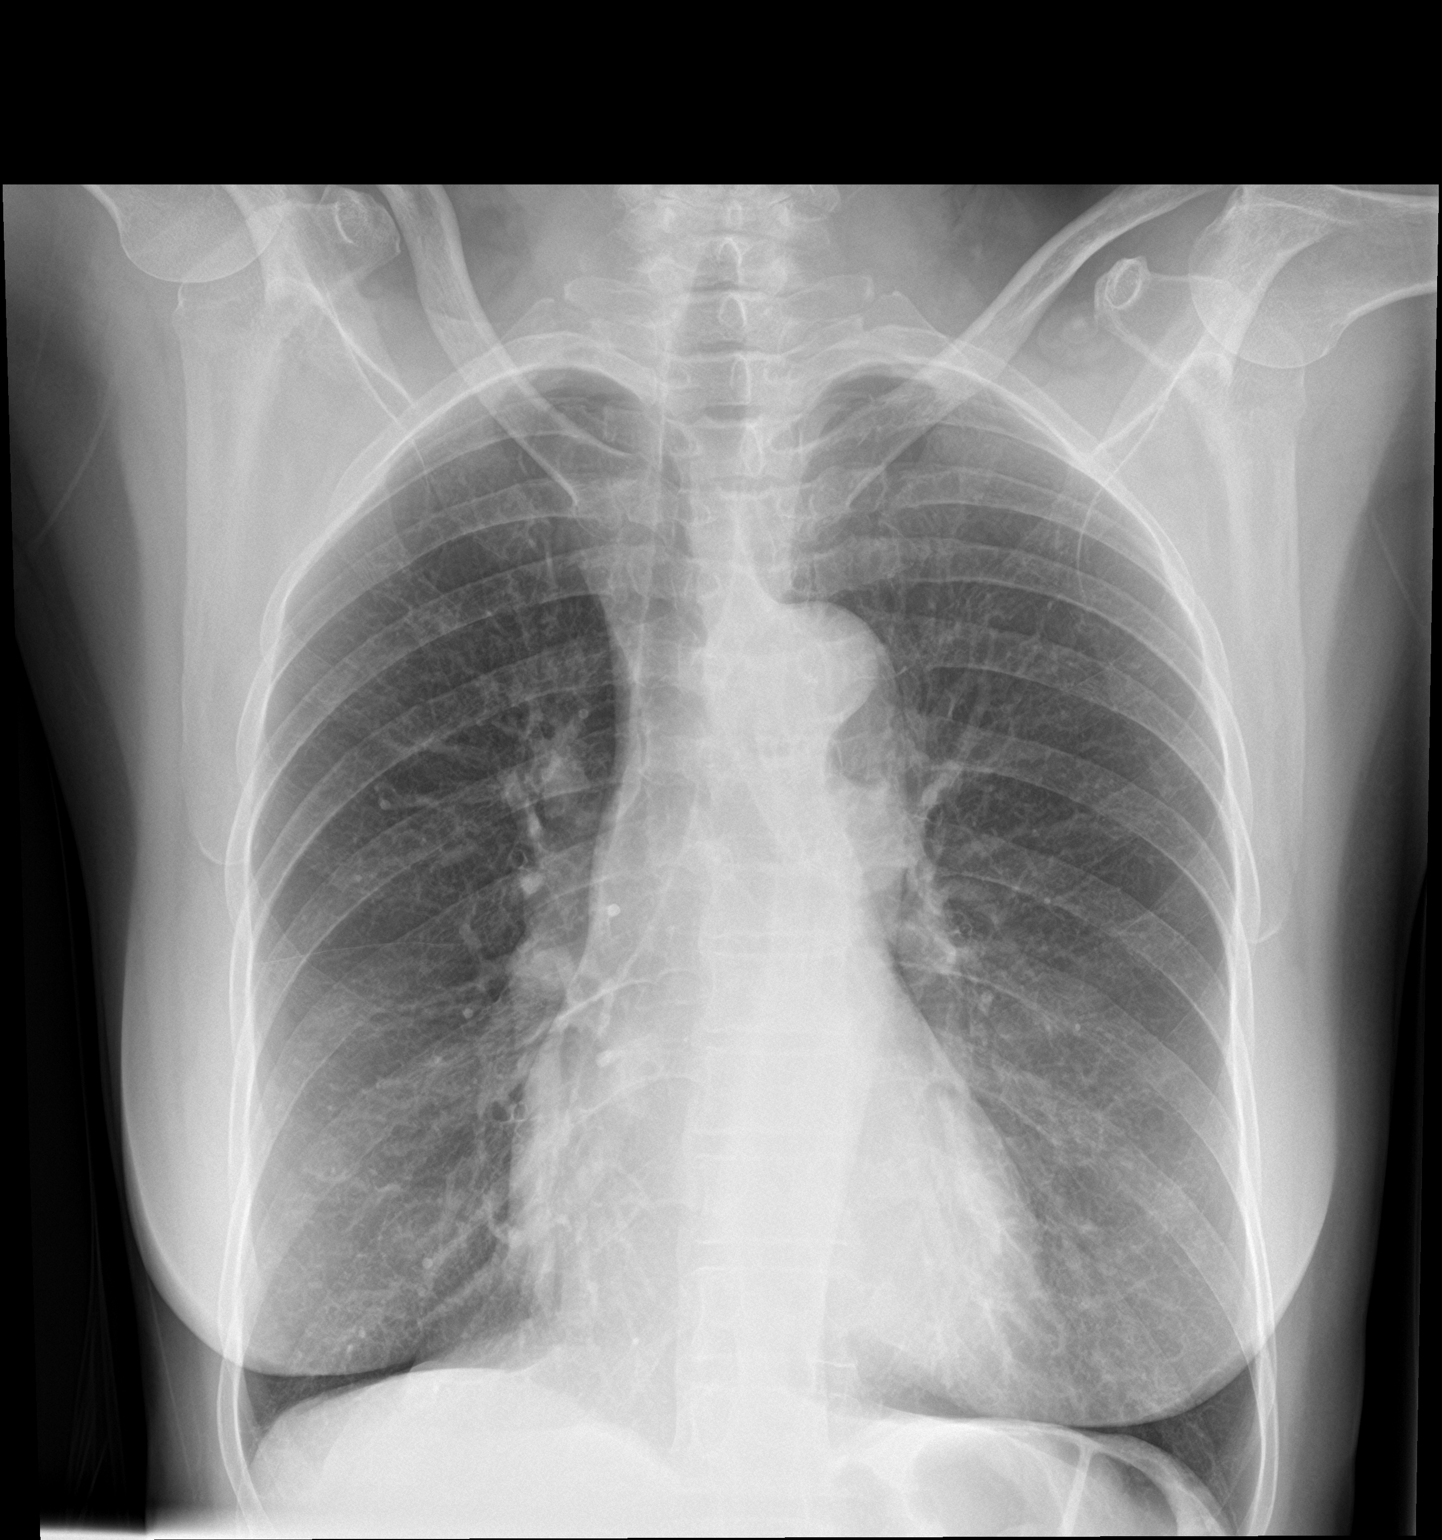

[chest lat]
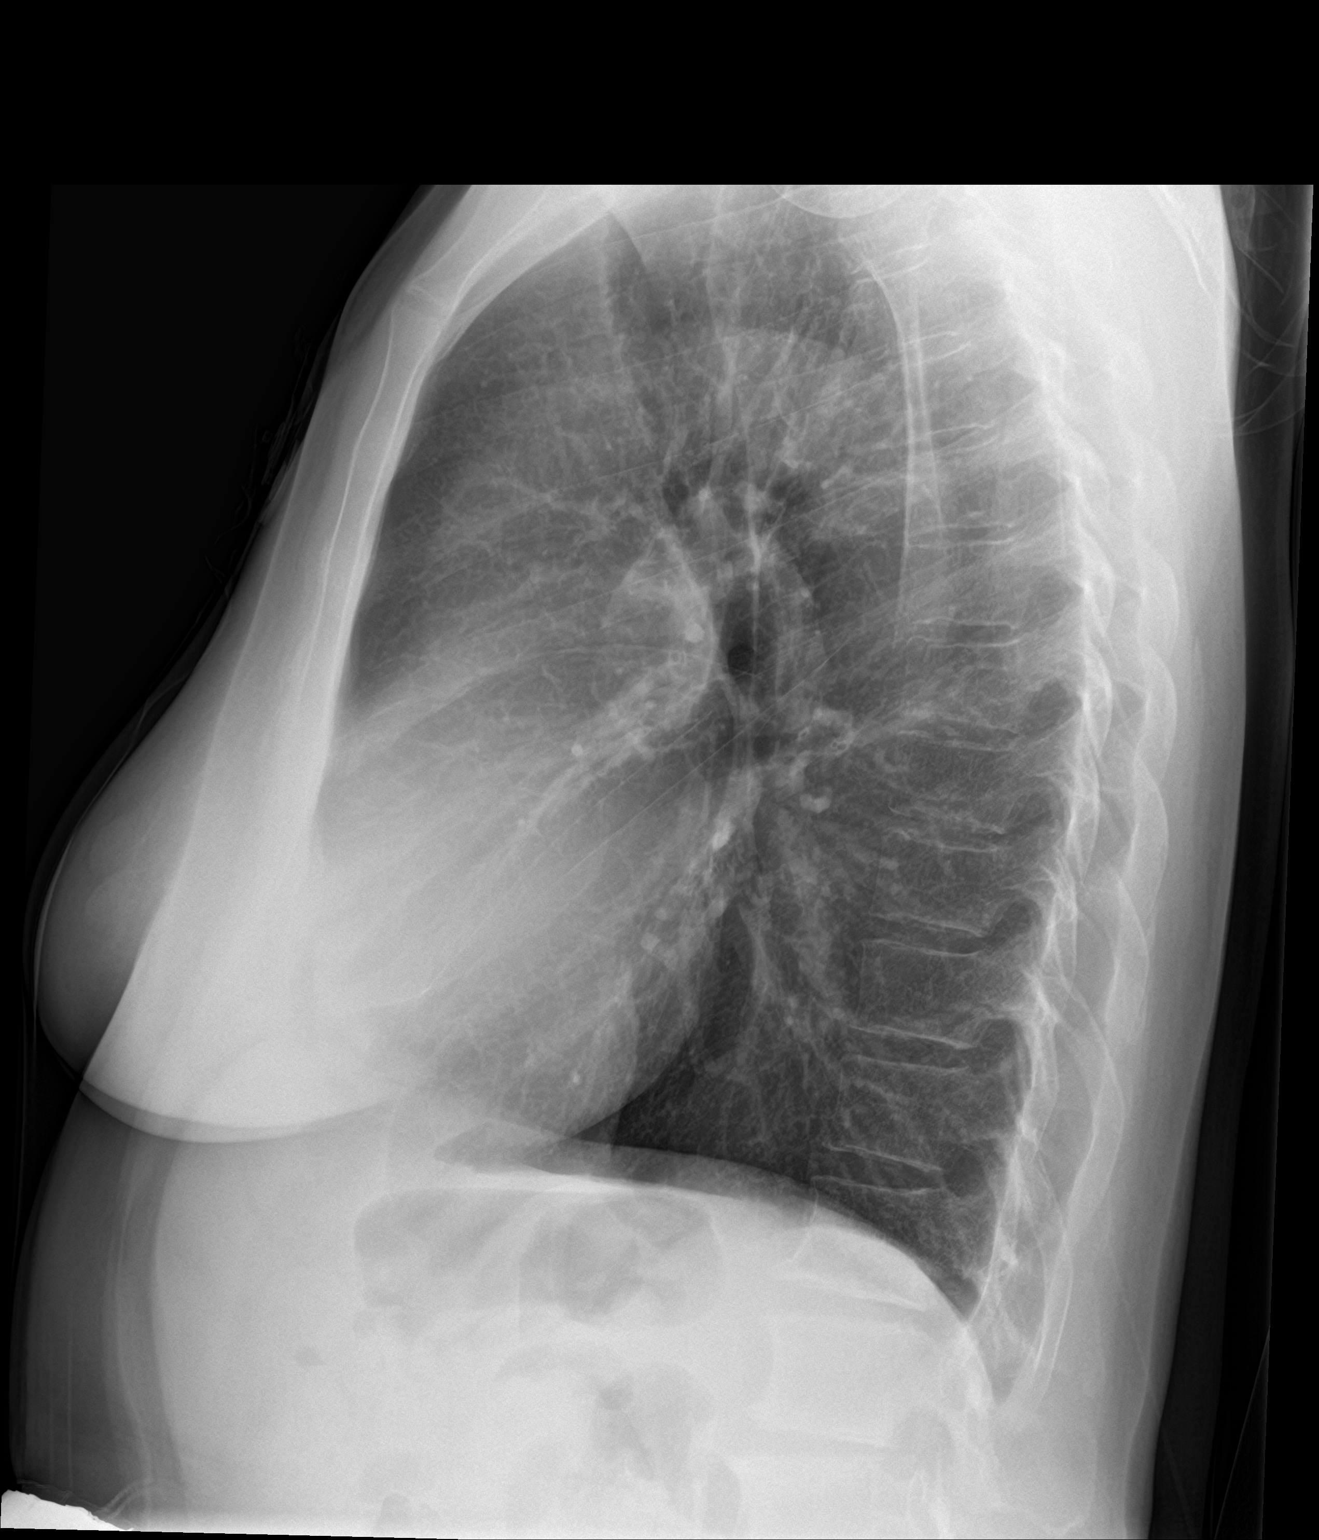

[2 of 2 positions shown; findings below may reference images not displayed]

FINDINGS: The lungs are hyperinflated likely secondary to COPD. There is no
focal parenchymal opacity. There is no pleural effusion or
pneumothorax. The heart and mediastinal contours are unremarkable.

The osseous structures are unremarkable.
IMPRESSION: No active cardiopulmonary disease.

## 2018-09-14 NOTE — Telephone Encounter (Signed)
Pt is calling to say the allegra has not helped at all, also she would like something sent in by prescription for her chronic sinus problems. Please advise

## 2018-09-14 NOTE — Telephone Encounter (Signed)
Requesting to speak with a nurse about meds. Please call pt back.  

## 2018-09-14 NOTE — Patient Outreach (Signed)
[facility]) Care Management  09/14/2018  [name] [birth year] [id number]   4th attempt made to contact/engage member for [facility] services.  She answers phone but state she will call this care manager back.  Will await call, if no call back within the next week will close case due to inability to engage.  [name], [place], MSN [place] [phone]

## 2018-09-15 ENCOUNTER — Telehealth: Payer: Self-pay

## 2018-09-15 NOTE — Telephone Encounter (Signed)
Returned call to patient, she states she will wait to see Dr. Reesa Bartlett at next office visit on 09/25/18.  Informed pt if s/s of chronic sinus problems do not improve or worsen to call back for appt in [facility], she verbalized understanding.  SChaplin, RN,BSN

## 2018-09-15 NOTE — Telephone Encounter (Signed)
Pt is calling back to speak with a nurse about meds. Please call pt back.  

## 2018-09-15 NOTE — Telephone Encounter (Signed)
Can she come to [facility]? 

## 2018-09-15 NOTE — Telephone Encounter (Signed)
error 

## 2018-09-16 NOTE — Progress Notes (Signed)
Internal Medicine Clinic Attending  Case discussed with Dr. Harbrecht at the time of the visit.  We reviewed the resident's history and exam and pertinent patient test results.  I agree with the assessment, diagnosis, and plan of care documented in the resident's note.   

## 2018-09-17 NOTE — Telephone Encounter (Signed)
Thank you :)

## 2018-09-21 ENCOUNTER — Other Ambulatory Visit: Payer: Self-pay | Admitting: *Deleted

## 2018-09-21 NOTE — Patient Outreach (Signed)
[facility]) Care Management  09/21/2018  [name] 1951-02-17 [id number]   No response from member after multiple unsuccessful outreach attempts and letter sent.  Will close case at this time due to inability to maintain contact.  Will notify member and primary MD of case closure.  [name], [place], MSN [place] [phone]

## 2018-09-23 ENCOUNTER — Encounter (HOSPITAL_COMMUNITY): Payer: Self-pay | Admitting: Emergency Medicine

## 2018-09-23 ENCOUNTER — Emergency Department (HOSPITAL_COMMUNITY)
Admission: EM | Admit: 2018-09-23 | Discharge: 2018-09-23 | Disposition: A | Discharge status: Home / Self Care | Payer: Medicare Other | Attending: Emergency Medicine | Admitting: Emergency Medicine

## 2018-09-23 ENCOUNTER — Other Ambulatory Visit: Payer: Self-pay

## 2018-09-23 ENCOUNTER — Emergency Department (HOSPITAL_COMMUNITY): Payer: Medicare Other

## 2018-09-23 DIAGNOSIS — R51 Headache: Secondary | ICD-10-CM | POA: Diagnosis not present

## 2018-09-23 DIAGNOSIS — Z87891 Personal history of nicotine dependence: Secondary | ICD-10-CM | POA: Insufficient documentation

## 2018-09-23 DIAGNOSIS — I10 Essential (primary) hypertension: Secondary | ICD-10-CM | POA: Insufficient documentation

## 2018-09-23 DIAGNOSIS — J45901 Unspecified asthma with (acute) exacerbation: Secondary | ICD-10-CM | POA: Diagnosis not present

## 2018-09-23 DIAGNOSIS — Z79899 Other long term (current) drug therapy: Secondary | ICD-10-CM | POA: Diagnosis not present

## 2018-09-23 DIAGNOSIS — R0602 Shortness of breath: Secondary | ICD-10-CM | POA: Diagnosis not present

## 2018-09-23 LAB — INFLUENZA PANEL BY PCR (TYPE A & B)
Influenza A By PCR: NEGATIVE
Influenza B By PCR: NEGATIVE

## 2018-09-23 LAB — CBC WITH DIFFERENTIAL/PLATELET
Abs Immature Granulocytes: 0.01 10*3/uL (ref 0.00–0.07)
Basophils Absolute: 0 10*3/uL (ref 0.0–0.1)
Basophils Relative: 1 %
Eosinophils Absolute: 0.3 10*3/uL (ref 0.0–0.5)
Eosinophils Relative: 6 %
HEMATOCRIT: 38.8 % (ref 36.0–46.0)
HEMOGLOBIN: 12.4 g/dL (ref 12.0–15.0)
Immature Granulocytes: 0 %
LYMPHS ABS: 1.3 10*3/uL (ref 0.7–4.0)
LYMPHS PCT: 32 %
MCH: 26.1 pg (ref 26.0–34.0)
MCHC: 32 g/dL (ref 30.0–36.0)
MCV: 81.5 fL (ref 80.0–100.0)
Monocytes Absolute: 0.5 10*3/uL (ref 0.1–1.0)
Monocytes Relative: 12 %
Neutro Abs: 2.1 10*3/uL (ref 1.7–7.7)
Neutrophils Relative %: 49 %
Platelets: 249 10*3/uL (ref 150–400)
RBC: 4.76 MIL/uL (ref 3.87–5.11)
RDW: 15.4 % (ref 11.5–15.5)
WBC: 4.2 10*3/uL (ref 4.0–10.5)
nRBC: 0 % (ref 0.0–0.2)

## 2018-09-23 LAB — BASIC METABOLIC PANEL
Anion gap: 10 (ref 5–15)
BUN: 9 mg/dL (ref 8–23)
CO2: 26 mmol/L (ref 22–32)
Calcium: 10.3 mg/dL (ref 8.9–10.3)
Chloride: 107 mmol/L (ref 98–111)
Creatinine, Ser: 0.73 mg/dL (ref 0.44–1.00)
GFR calc Af Amer: >60 mL/min (ref >60)
GFR calc non Af Amer: >60 mL/min (ref >60)
Glucose, Bld: 100 mg/dL — ABNORMAL HIGH (ref 70–99)
Potassium: 4.1 mmol/L (ref 3.5–5.1)
Sodium: 143 mmol/L (ref 135–145)

## 2018-09-23 MED ORDER — PREDNISONE 20 MG PO TABS: 60.0000 mg | ORAL_TABLET | Freq: Every day | ORAL | 0 refills | Status: AC

## 2018-09-23 MED ORDER — METHYLPREDNISOLONE SODIUM SUCC 125 MG IJ SOLR
125.0000 mg | Freq: Once | INTRAMUSCULAR | Status: AC
  Administered 2018-09-23: 125 mg via INTRAVENOUS
  Filled 2018-09-23: qty 2

## 2018-09-23 MED ORDER — LEVALBUTEROL HCL 1.25 MG/0.5ML IN NEBU
1.2500 mg | INHALATION_SOLUTION | Freq: Once | RESPIRATORY_TRACT | Status: AC
  Administered 2018-09-23: 1.25 mg via RESPIRATORY_TRACT
  Filled 2018-09-23: qty 0.5

## 2018-09-23 MED ORDER — IPRATROPIUM-ALBUTEROL 0.5-2.5 (3) MG/3ML IN SOLN
3.0000 mL | Freq: Once | RESPIRATORY_TRACT | Status: DC
  Filled 2018-09-23: qty 3

## 2018-09-23 MED ORDER — OXYMETAZOLINE HCL 0.05 % NA SOLN: 1.0000 | Freq: Two times a day (BID) | NASAL | 0 refills | Status: AC

## 2018-09-23 MED ORDER — TRAMADOL HCL 50 MG PO TABS
50.0000 mg | ORAL_TABLET | Freq: Once | ORAL | Status: AC
  Administered 2018-09-23: 50 mg via ORAL
  Filled 2018-09-23: qty 1

## 2018-09-23 MED ORDER — LEVALBUTEROL HCL 1.25 MG/3ML IN NEBU: 1.2500 mg | INHALATION_SOLUTION | RESPIRATORY_TRACT | 2 refills | Status: DC | PRN

## 2018-09-23 MED ORDER — IPRATROPIUM BROMIDE 0.02 % IN SOLN
0.5000 mg | Freq: Once | RESPIRATORY_TRACT | Status: AC
  Administered 2018-09-23: 0.5 mg via RESPIRATORY_TRACT
  Filled 2018-09-23: qty 2.5

## 2018-09-23 NOTE — ED Triage Notes (Signed)
C/o shortness of breath- started about 10 minutes ago, has had body aches, back pain, headache- took ibuprofen this am for aches.  Pt has audible wheezing, hx of asthma

## 2018-09-23 NOTE — ED Provider Notes (Signed)
[facility] EMERGENCY DEPARTMENT Provider Note   CSN: [id number] Arrival date & time: 09/23/18  1213     History   Chief Complaint Chief Complaint  Patient presents with  . Shortness of Breath    HPI [name] is a 68 y.o. female.  The history is provided by the patient.  Shortness of Breath  Severity:  Moderate Onset quality:  Sudden Timing:  Constant Progression:  Unchanged Chronicity:  Recurrent Context: URI   Relieved by:  Inhaler Worsened by:  Nothing Associated symptoms: cough, fever, headaches and wheezing   Associated symptoms: no abdominal pain, no chest pain, no ear pain, no neck pain, no rash, no sore throat, no sputum production, no syncope and no vomiting   Risk factors: no hx of PE/DVT     Past Medical History:  Diagnosis Date  . A-fib ([place])   . Allergy    seasonal  . Asthma   . Chronic anticoagulation   . Eczema   . GERD (gastroesophageal reflux disease)   . Hypertension   . PAF (paroxysmal atrial fibrillation) ([place]) 11/26/2015   April/May 2017: Multiple ED visits for A. Fib.  02/12/16: Improved on diltiazem IV. Cardiology consulted in the ED and discharge patient with diltiazem XR 120 mg once daily with 60 mg by mouth every 6 hours as needed for symptomatic palpitations, flecainide 50 mg twice daily. Referred to the atrial fibrillation clinic.  02/17/16: Seen in the atrial fibrillation clinic by NP Roderic Palau. Normal sin    Patient Active Problem List   Diagnosis Date Noted  . History of radiofrequency ablation (RFA) procedure for cardiac arrhythmia   . Asthma exacerbation 08/28/2018  . Chronic congestion of paranasal sinus 06/14/2018  . Primary osteoarthritis involving multiple joints 06/14/2018  . Acute pain of left knee 05/15/2018  . Hyperpigmented skin lesion 09/09/2017  . Osteoporosis without current pathological fracture 04/15/2017  . Normocytic anemia 10/19/2016  . Hypokalemia   . Hyperlipidemia 03/18/2016  .  Special screening for malignant neoplasms, colon 03/17/2016  . Eczema 03/17/2016  . Cervical cancer screening 03/17/2016  . Assistance with transportation 03/17/2016  . HTN (hypertension), benign   . GERD (gastroesophageal reflux disease)   . PAF (paroxysmal atrial fibrillation) ([place]) 11/26/2015  . Chronic anticoagulation 11/26/2015  . Marijuana abuse 06/11/2015  . Asthma, mild intermittent     Past Surgical History:  Procedure Laterality Date  . ATRIAL FIBRILLATION ABLATION N/A 09/30/2017   Procedure: ATRIAL FIBRILLATION ABLATION;  Surgeon: Thompson Grayer, MD;  Location: [facility];  Service: Cardiovascular;  Laterality: N/A;  . TONSILLECTOMY    . TUBAL LIGATION       OB History   No obstetric history on file.      Home Medications    Prior to Admission medications   Medication Sig Start Date End Date Taking? Authorizing Provider  acetaminophen (TYLENOL) 500 MG tablet Take 1,000 mg by mouth 2 (two) times daily as needed for headache (pain).    [provider]  diclofenac sodium (VOLTAREN) 1 % GEL Apply 2 g topically 4 (four) times daily. Patient not taking: Reported on 08/28/2018 06/13/18   Modena Nunnery D, DO  dicyclomine (BENTYL) 20 MG tablet Take 1 tablet (20 mg total) by mouth 2 (two) times daily. Patient taking differently: Take 20 mg by mouth every other day.  01/25/18   Carlisle Cater, PA-C  diltiazem (CARDIZEM) 60 MG tablet Take 1 tablet (60 mg total) by mouth every 6 (six) hours as  needed (for rapid afib heart rate over 100). 08/31/18   Sherran Needs, NP  diltiazem (CARTIA XT) 120 MG 24 hr capsule TAKE 1 CAPSULE(120 MG) BY MOUTH EVERY EVENING 08/31/18   Sherran Needs, NP  esomeprazole (NEXIUM) 20 MG capsule Take 2 capsules (40 mg total) by mouth daily. Patient taking differently: Take 40 mg by mouth every other day.  06/13/18 12/10/18  Bloomfield, Carley D, DO  fexofenadine (ALLEGRA ALLERGY) 60 MG tablet Take 1 tablet (60 mg total) by mouth 2 (two)  times daily. 09/08/18   Kathi Ludwig, MD  fluticasone (FLOVENT HFA) 44 MCG/ACT inhaler Inhale 1 puff into the lungs 2 (two) times daily.    [provider]  levalbuterol (XOPENEX HFA) 45 MCG/ACT inhaler Inhale 1 puff into the lungs every 6 (six) hours as needed for shortness of breath. 11/21/17   Carmin Muskrat, MD  levalbuterol Penne Lash) 1.25 MG/3ML nebulizer solution Take 1.25 mg by nebulization every 4 (four) hours as needed for wheezing. 09/23/18 09/23/19  Lennice Sites, DO  linaclotide (LINZESS) 145 MCG CAPS capsule Take 1 capsule (145 mcg total) by mouth daily before breakfast. Patient not taking: Reported on 06/03/2018 02/10/18   Zehr, Laban Emperor, PA-C  losartan (COZAAR) 50 MG tablet TAKE 1 TABLET(50 MG) BY MOUTH DAILY Patient taking differently: Take 50 mg by mouth daily.  09/04/18   [name], MD  magnesium hydroxide (MILK OF MAGNESIA) 400 MG/5ML suspension Take 15-30 mLs by mouth at bedtime as needed for mild constipation. 11/01/17   Harris, Abigail, PA-C  montelukast (SINGULAIR) 10 MG tablet Take 1 tablet (10 mg total) by mouth at bedtime. 05/16/18   [name], MD  Multiple Vitamin (MULTIVITAMIN WITH MINERALS) TABS tablet Take 1 tablet by mouth every other day.    [provider]  oxymetazoline (AFRIN NASAL SPRAY) 0.05 % nasal spray Place 1 spray into both nostrils 2 (two) times daily for 3 days. 09/23/18 09/26/18  Taym Twist, DO  polyethylene glycol powder (GLYCOLAX/MIRALAX) powder Mix 17g of Miralax in 8 ounces of water every other day Patient taking differently: Take 17 g by mouth daily as needed for mild constipation.  07/20/17   [name], MD  potassium chloride (K-DUR,KLOR-CON) 10 MEQ tablet Take 1 tablet (10 mEq total) by mouth 2 (two) times daily. 03/08/18   Sherran Needs, NP  predniSONE (DELTASONE) 20 MG tablet Take 3 tablets (60 mg total) by mouth daily for 4 days. 09/23/18 09/27/18  Lennice Sites, DO  rivaroxaban (XARELTO) 20 MG TABS tablet Take 1  tablet (20 mg total) by mouth daily with supper. 08/31/18   Sherran Needs, NP  simethicone (GAS-X) 80 MG chewable tablet Chew 1 tablet (80 mg total) by mouth every 6 (six) hours as needed for flatulence. Patient not taking: Reported on 06/26/2018 11/01/17   Margarita Mail, PA-C  spironolactone (ALDACTONE) 25 MG tablet Take 1 tablet (25 mg total) by mouth daily. Patient not taking: Reported on 08/28/2018 06/13/18   Modena Nunnery D, DO  sucralfate (CARAFATE) 1 GM/10ML suspension Take 10 mLs (1 g total) by mouth 4 (four) times daily. Patient not taking: Reported on 08/28/2018 01/30/18 01/30/19  Melanee Spry, MD  tiotropium (SPIRIVA) 18 MCG inhalation capsule Place 1 capsule (18 mcg total) into inhaler and inhale daily. 05/26/18   [name], MD  triamcinolone cream (KENALOG) 0.1 % Apply 1 application topically 2 (two) times daily. Patient taking differently: Apply 1 application topically 2 (two) times daily as needed (rash/itching).  05/06/18  Henderly, Britni A, PA-C    Family History Family History  Problem Relation Age of Onset  . Heart attack Mother        [name] age  . Breast cancer Sister        Late 22s; now s/p mastectomy   . Lung cancer Brother        x 2  . Cancer Sister   . Cancer Sister   . Cancer Sister   . Colon cancer Neg Hx   . Colon polyps Neg Hx   . Esophageal cancer Neg Hx   . Rectal cancer Neg Hx   . Stomach cancer Neg Hx     Social History Social History   Tobacco Use  . Smoking status: Former Smoker    Last attempt to quit: 10/24/2015    Years since quitting: 2.9  . Smokeless tobacco: Never Used  Substance Use Topics  . Alcohol use: No  . Drug use: No    Types: Marijuana    Comment: Smoked marijuana in the past.     Allergies   Albuterol; Celecoxib; and Protonix [pantoprazole sodium]   Review of Systems Review of Systems  Constitutional: Positive for fever. Negative for chills.  HENT: Positive for congestion, sinus pressure and sinus pain.  Negative for ear pain and sore throat.   Eyes: Negative for pain and visual disturbance.  Respiratory: Positive for cough, shortness of breath and wheezing. Negative for sputum production.   Cardiovascular: Negative for chest pain, palpitations and syncope.  Gastrointestinal: Negative for abdominal pain and vomiting.  Genitourinary: Negative for dysuria and hematuria.  Musculoskeletal: Positive for myalgias. Negative for arthralgias, back pain and neck pain.  Skin: Negative for color change and rash.  Neurological: Positive for headaches. Negative for seizures and syncope.  All other systems reviewed and are negative.    Physical Exam Updated Vital Signs  ED Triage Vitals  Enc Vitals Group     BP 09/23/18 1224 (!) 168/99     Pulse Rate 09/23/18 1224 93     Resp 09/23/18 1224 (!) 26     Temp 09/23/18 1224 98.7 F (37.1 C)     Temp src --      SpO2 09/23/18 1224 100 %     Weight 09/23/18 1227 151 lb 14.4 oz (68.9 kg)     Height 09/23/18 1227 5\' 7"  (1.702 m)     Head Circumference --      Peak Flow --      Pain Score 09/23/18 1225 0     Pain Loc --      Pain Edu? --      Excl. in [place]? --     Physical Exam Vitals signs and nursing note reviewed.  Constitutional:      General: She is not in acute distress.    Appearance: She is well-developed. She is not ill-appearing.  HENT:     Head: Normocephalic and atraumatic.  Eyes:     Extraocular Movements: Extraocular movements intact.     Conjunctiva/sclera: Conjunctivae normal.     Pupils: Pupils are equal, round, and reactive to light.  Neck:     Musculoskeletal: Normal range of motion and neck supple.  Cardiovascular:     Rate and Rhythm: Normal rate and regular rhythm.     Pulses: Normal pulses.     Heart sounds: Normal heart sounds. No murmur.  Pulmonary:     Effort: Tachypnea present. No respiratory distress.     Breath sounds: Decreased  breath sounds and wheezing present.  Abdominal:     Palpations: Abdomen is soft.      Tenderness: There is no abdominal tenderness.  Musculoskeletal:     Right lower leg: No edema.     Left lower leg: No edema.  Skin:    General: Skin is warm and dry.     Capillary Refill: Capillary refill takes less than 2 seconds.  Neurological:     General: No focal deficit present.     Mental Status: She is alert.  Psychiatric:        Mood and Affect: Mood normal.      ED Treatments / Results  Labs (all labs ordered are listed, but only abnormal results are displayed) Labs Reviewed  BASIC METABOLIC PANEL - Abnormal; Notable for the following components:      Result Value   Glucose, Bld 100 (*)    All other components within normal limits  CBC WITH DIFFERENTIAL/PLATELET  INFLUENZA PANEL BY PCR (TYPE A & B)    EKG EKG Interpretation  Date/Time:  Saturday September 23 2018 12:23:00 EST Ventricular Rate:  98 PR Interval:    QRS Duration: 84 QT Interval:  369 QTC Calculation: 472 R Axis:   66 Text Interpretation:  Sinus rhythm Consider right atrial enlargement RSR' in V1 or V2, probably normal variant Baseline wander in lead(s) V3 Confirmed by Lennice Sites [phone]) on 09/23/2018 12:27:42 PM   Radiology Dg Chest Port 1 View  Result Date: 09/23/2018 CLINICAL DATA:  Shortness of breath today. EXAM: PORTABLE CHEST 1 VIEW COMPARISON:  PA and lateral chest 09/05/2018 and 08/21/2017. FINDINGS: Lungs clear. Heart size normal. No pneumothorax or pleural effusion. No acute or focal bony abnormality. IMPRESSION: No acute disease. Electronically Signed   By: Inge Rise M.D.   On: 09/23/2018 13:01    Procedures Procedures (including critical care time)  Medications Ordered in ED Medications  methylPREDNISolone sodium succinate (SOLU-MEDROL) 125 mg/2 mL injection 125 mg (125 mg Intravenous Given 09/23/18 1237)  levalbuterol (XOPENEX) nebulizer solution 1.25 mg (1.25 mg Nebulization Given 09/23/18 1238)  ipratropium (ATROVENT) nebulizer solution 0.5 mg (0.5 mg Nebulization  Given 09/23/18 1238)  traMADol (ULTRAM) tablet 50 mg (50 mg Oral Given 09/23/18 1407)  ipratropium (ATROVENT) nebulizer solution 0.5 mg (0.5 mg Nebulization Given 09/23/18 1445)     Initial Impression / Assessment and Plan / ED Course  I have reviewed the triage vital signs and the nursing notes.  Pertinent labs & imaging results that were available during my care of the patient were reviewed by me and considered in my medical decision making (see chart for details).     [name] is a 68 year old female with history of atrial fibrillation on anticoagulation, hypertension, asthma who presents to the ED with asthma exacerbation.  Patient with symptoms that started this morning.  Has had viral URI symptoms with cough and congestion.  No sputum production.  Patient has some wheezing on exam.  No signs of respiratory distress.  Normal vitals, no fever.  No signs of volume overload on exam.  No chest pain.  EKG shows sinus rhythm.  No ischemic changes.  Doubt cardiac process.  Likely viral process we will check for influenza, will get chest x-ray to evaluate for pneumonia.  Patient given levalbuterol treatment, Solu-Medrol.  Will reevaluate.  Influenza testing negative.  Chest x-ray with no signs of pneumonia, pneumothorax, pleural effusion.  No significant leukocytosis, anemia, electrolyte abnormality.  Patient feels better after breathing treatment.  Was given additional DuoNeb treatment.  Offered patient observation stay for further treatments but she would like to try treatment at home as she does have a nebulizer at home.  She is able to ambulate without any issues.  Discharged from ED in good condition and given return precautions.  Patient likely with asthma exacerbation secondary to viral process.  This chart was dictated using voice recognition software.  Despite best efforts to proofread,  errors can occur which can change the documentation meaning.    Final Clinical Impressions(s) / ED  Diagnoses   Final diagnoses:  Moderate asthma with exacerbation, unspecified whether persistent    ED Discharge Orders         Ordered    predniSONE (DELTASONE) 20 MG tablet  Daily     09/23/18 1429    oxymetazoline (AFRIN NASAL SPRAY) 0.05 % nasal spray  2 times daily     09/23/18 1429    levalbuterol (XOPENEX) 1.25 MG/3ML nebulizer solution  Every 4 hours PRN     09/23/18 1502           Rykin Route, Quita Skye, DO 09/23/18 1508

## 2018-09-23 NOTE — ED Notes (Signed)
Patient reports feeling better after tx, States she will take another treatment when she gets home.

## 2018-09-23 NOTE — ED Notes (Signed)
Patient reports shortness of breath while putting on clothes. Expiratory wheezing auscultated. EDP notified. Breathing tx verbally ordered.

## 2018-09-25 ENCOUNTER — Encounter: Payer: Self-pay | Admitting: Internal Medicine

## 2018-09-25 ENCOUNTER — Ambulatory Visit (INDEPENDENT_AMBULATORY_CARE_PROVIDER_SITE_OTHER): Payer: Medicare Other | Admitting: Internal Medicine

## 2018-09-25 ENCOUNTER — Other Ambulatory Visit: Payer: Self-pay

## 2018-09-25 VITALS — BP 136/88 | HR 95 | Ht 67.0 in | Wt 152.0 lb

## 2018-09-25 VITALS — BP 151/79 | HR 88 | Temp 98.2°F | Ht 67.0 in | Wt 152.1 lb

## 2018-09-25 DIAGNOSIS — Z79899 Other long term (current) drug therapy: Secondary | ICD-10-CM

## 2018-09-25 DIAGNOSIS — J329 Chronic sinusitis, unspecified: Secondary | ICD-10-CM | POA: Diagnosis not present

## 2018-09-25 DIAGNOSIS — Z7952 Long term (current) use of systemic steroids: Secondary | ICD-10-CM | POA: Diagnosis not present

## 2018-09-25 DIAGNOSIS — J45901 Unspecified asthma with (acute) exacerbation: Secondary | ICD-10-CM

## 2018-09-25 DIAGNOSIS — I480 Paroxysmal atrial fibrillation: Secondary | ICD-10-CM

## 2018-09-25 DIAGNOSIS — Z7901 Long term (current) use of anticoagulants: Secondary | ICD-10-CM

## 2018-09-25 DIAGNOSIS — I10 Essential (primary) hypertension: Secondary | ICD-10-CM

## 2018-09-25 MED ORDER — AMOXICILLIN-POT CLAVULANATE 875-125 MG PO TABS: 1.0000 | ORAL_TABLET | Freq: Two times a day (BID) | ORAL | 0 refills | Status: AC

## 2018-09-25 MED ORDER — NAPROXEN 500 MG PO TABS: 500.0000 mg | ORAL_TABLET | Freq: Two times a day (BID) | ORAL | 0 refills | Status: DC

## 2018-09-25 MED ORDER — TRAMADOL HCL 50 MG PO TABS: 50.0000 mg | ORAL_TABLET | Freq: Four times a day (QID) | ORAL | 0 refills | Status: DC | PRN

## 2018-09-25 NOTE — Assessment & Plan Note (Signed)
She continued to experience sinus congestion for the past 3 to 4 weeks.  -Augmentin for 7 days. -She was advised to continue taking Flonase and Nettie pot. -Tramadol for pain as she cannot tolerate NSAID and Tylenol is not working.

## 2018-09-25 NOTE — Assessment & Plan Note (Signed)
Currently in sinus rhythm. No more episodes of A. fib since ablation.  We will continue Xarelto and diltiazem. She will continue to follow-up with cardiology.

## 2018-09-25 NOTE — Patient Instructions (Signed)
Thank you for visiting clinic today. Your blood pressure is little elevated but I am not making any changes to your blood pressure medicine as you are taking prednisone. I am giving you a course of antibiotics for your sinuses. Continue using Nettie pot. Continue to using Flonase. Also sending you to see a lung doctor for your frequent asthma exacerbations. Follow-up in 1 month.

## 2018-09-25 NOTE — Progress Notes (Signed)
   CC: For follow-up of her ED visit due to asthma exacerbation.  HPI:  [name] is a 68 y.o. with past medical history as listed below came to the clinic for follow-up of her recent ED visit due to asthma exacerbation 2 days ago.  She continued to experience sinus pain and pressure along with sinus headaches for more than 3 weeks now.  She is also complaining of subjective fevers and occasional chills.  She has tried Flonase, Afrin and Nettie pot without much relief. She denies any shortness of breath today.  She continue to take Xopenex every 6 hourly and is on day 3 of prednisone.  See assessment and plan for her chronic conditions.  Past Medical History:  Diagnosis Date  . A-fib ([place])   . Allergy    seasonal  . Asthma   . Chronic anticoagulation   . Eczema   . GERD (gastroesophageal reflux disease)   . Hypertension   . PAF (paroxysmal atrial fibrillation) ([place]) 11/26/2015   April/May 2017: Multiple ED visits for A. Fib.  02/12/16: Improved on diltiazem IV. Cardiology consulted in the ED and discharge patient with diltiazem XR 120 mg once daily with 60 mg by mouth every 6 hours as needed for symptomatic palpitations, flecainide 50 mg twice daily. Referred to the atrial fibrillation clinic.  02/17/16: Seen in the atrial fibrillation clinic by NP Roderic Palau. Normal sin   Review of Systems: Negative except mentioned in HPI.  Physical Exam:  Vitals:   09/25/18 1459  BP: (!) 151/79  Pulse: 88  Temp: 98.2 F (36.8 C)  TempSrc: Oral  SpO2: 97%  Weight: 152 lb 1.6 oz (69 kg)  Height: 5\' 7"  (1.702 m)    General: Vital signs reviewed.  Patient is well-developed and well-nourished, in no acute distress and cooperative with exam.  HENT: Normocephalic and atraumatic, tenderness along maxillary and frontal sinuses, erythematous and edematous bilateral nasal turbinate, fluid level right ear. Eyes: EOMI, conjunctivae normal, no scleral icterus.  Cardiovascular: RRR, S1 normal,  S2 normal, no murmurs, gallops, or rubs. Pulmonary/Chest: Mildly decreased air entry bilaterally, no wheezes, rales, or rhonchi. Abdominal: Soft, non-tender, non-distended, BS +, no masses, organomegaly, or guarding present.  Extremities: No lower extremity edema bilaterally,  pulses symmetric and intact bilaterally. No cyanosis or clubbing. Skin: Warm, dry and intact. No rashes or erythema. Psychiatric: Normal mood and affect. speech and behavior is normal. Cognition and memory are normal.  Assessment & Plan:   See Encounters Tab for problem based charting.  Patient discussed with Dr. Angelia Mould.

## 2018-09-25 NOTE — Assessment & Plan Note (Signed)
BP Readings from Last 3 Encounters:  09/25/18 (!) 151/79  09/25/18 136/88  09/23/18 (!) 165/93   Her blood pressure was elevated today.  Patient is on prednisone for her recent asthma exacerbation and continue to take Xopenex nebulizer treatments every 6 hourly. She developed  Dizziness in the past with increase in her dose of losartan.  We will continue current dose of losartan at 50 mg daily with diltiazem 120 mg daily. We will reassess during next follow-up visit when she is off from prednisone.

## 2018-09-25 NOTE — Patient Instructions (Addendum)
Medication Instructions:  Your physician recommends that you continue on your current medications as directed. Please refer to the Current Medication list given to you today.  Labwork: None ordered.  Testing/Procedures: None ordered.  Follow-Up: Your physician wants you to follow-up in: 4 months with AFIB clinic.   Any Other Special Instructions Will Be Listed Below (If Applicable).  If you need a refill on your cardiac medications before your next appointment, please call your pharmacy.

## 2018-09-25 NOTE — Progress Notes (Signed)
PCP: [name], MD   Primary EP: Dr Rayann Heman  [name] is a 69 y.o. female who presents today for routine electrophysiology followup.  Since last being seen in our clinic, the patient reports doing very well.  Recently having URI symptoms and wheezing.  She has been started on treatment by ED team and is making improvements.  Today, she denies symptoms of palpitations, chest pain, lower extremity edema, dizziness, presyncope, or syncope.  The patient is otherwise without complaint today.   Past Medical History:  Diagnosis Date  . A-fib ([place])   . Allergy    seasonal  . Asthma   . Chronic anticoagulation   . Eczema   . GERD (gastroesophageal reflux disease)   . Hypertension   . PAF (paroxysmal atrial fibrillation) ([place]) 11/26/2015   April/May 2017: Multiple ED visits for A. Fib.  02/12/16: Improved on diltiazem IV. Cardiology consulted in the ED and discharge patient with diltiazem XR 120 mg once daily with 60 mg by mouth every 6 hours as needed for symptomatic palpitations, flecainide 50 mg twice daily. Referred to the atrial fibrillation clinic.  02/17/16: Seen in the atrial fibrillation clinic by NP Roderic Palau. Normal sin   Past Surgical History:  Procedure Laterality Date  . ATRIAL FIBRILLATION ABLATION N/A 09/30/2017   Procedure: ATRIAL FIBRILLATION ABLATION;  Surgeon: Thompson Grayer, MD;  Location: [facility];  Service: Cardiovascular;  Laterality: N/A;  . TONSILLECTOMY    . TUBAL LIGATION      ROS- all systems are reviewed and negatives except as per HPI above  Current Outpatient Medications  Medication Sig Dispense Refill  . acetaminophen (TYLENOL) 500 MG tablet Take 1,000 mg by mouth 2 (two) times daily as needed for headache (pain).    Marland Kitchen diclofenac sodium (VOLTAREN) 1 % GEL Apply 2 g topically 4 (four) times daily. 100 g 3  . dicyclomine (BENTYL) 20 MG tablet Take 1 tablet (20 mg total) by mouth 2 (two) times daily. (Patient taking differently: Take 20 mg by  mouth every other day. ) 20 tablet 0  . diltiazem (CARDIZEM) 60 MG tablet Take 1 tablet (60 mg total) by mouth every 6 (six) hours as needed (for rapid afib heart rate over 100). 45 tablet 3  . diltiazem (CARTIA XT) 120 MG 24 hr capsule TAKE 1 CAPSULE(120 MG) BY MOUTH EVERY EVENING 90 capsule 6  . esomeprazole (NEXIUM) 20 MG capsule Take 2 capsules (40 mg total) by mouth daily. (Patient taking differently: Take 40 mg by mouth every other day. ) 180 capsule 1  . fexofenadine (ALLEGRA ALLERGY) 60 MG tablet Take 1 tablet (60 mg total) by mouth 2 (two) times daily. 30 tablet 1  . fluticasone (FLOVENT HFA) 44 MCG/ACT inhaler Inhale 1 puff into the lungs 2 (two) times daily.    Marland Kitchen levalbuterol (XOPENEX HFA) 45 MCG/ACT inhaler Inhale 1 puff into the lungs every 6 (six) hours as needed for shortness of breath. 1 Inhaler 12  . levalbuterol (XOPENEX) 1.25 MG/3ML nebulizer solution Take 1.25 mg by nebulization every 4 (four) hours as needed for wheezing. 72 mL 2  . linaclotide (LINZESS) 145 MCG CAPS capsule Take 1 capsule (145 mcg total) by mouth daily before breakfast. 10 capsule 0  . losartan (COZAAR) 50 MG tablet TAKE 1 TABLET(50 MG) BY MOUTH DAILY (Patient taking differently: Take 50 mg by mouth daily. ) 30 tablet 3  . magnesium hydroxide (MILK OF MAGNESIA) 400 MG/5ML suspension Take 15-30 mLs by mouth at  bedtime as needed for mild constipation. 360 mL 0  . montelukast (SINGULAIR) 10 MG tablet Take 1 tablet (10 mg total) by mouth at bedtime. 90 tablet 1  . Multiple Vitamin (MULTIVITAMIN WITH MINERALS) TABS tablet Take 1 tablet by mouth every other day.    Marland Kitchen oxymetazoline (AFRIN NASAL SPRAY) 0.05 % nasal spray Place 1 spray into both nostrils 2 (two) times daily for 3 days. 30 mL 0  . polyethylene glycol powder (GLYCOLAX/MIRALAX) powder Mix 17g of Miralax in 8 ounces of water every other day (Patient taking differently: Take 17 g by mouth daily as needed for mild constipation. ) 255 g 0  . potassium chloride  (K-DUR,KLOR-CON) 10 MEQ tablet Take 1 tablet (10 mEq total) by mouth 2 (two) times daily. 60 tablet 6  . predniSONE (DELTASONE) 20 MG tablet Take 3 tablets (60 mg total) by mouth daily for 4 days. 12 tablet 0  . rivaroxaban (XARELTO) 20 MG TABS tablet Take 1 tablet (20 mg total) by mouth daily with supper. 30 tablet 6  . simethicone (GAS-X) 80 MG chewable tablet Chew 1 tablet (80 mg total) by mouth every 6 (six) hours as needed for flatulence. 30 tablet 0  . spironolactone (ALDACTONE) 25 MG tablet Take 1 tablet (25 mg total) by mouth daily. 30 tablet 1  . sucralfate (CARAFATE) 1 GM/10ML suspension Take 10 mLs (1 g total) by mouth 4 (four) times daily. 420 mL 1  . tiotropium (SPIRIVA) 18 MCG inhalation capsule Place 1 capsule (18 mcg total) into inhaler and inhale daily. 30 capsule 2  . triamcinolone cream (KENALOG) 0.1 % Apply 1 application topically 2 (two) times daily. (Patient taking differently: Apply 1 application topically 2 (two) times daily as needed (rash/itching). ) 30 g 0   No current facility-administered medications for this visit.     Physical Exam: Vitals:   09/25/18 1047  BP: 136/88  Pulse: 95  SpO2: 95%  Weight: 152 lb (68.9 kg)  Height: 5\' 7"  (1.702 m)    GEN- The patient is well appearing, alert and oriented x 3 today.   Head- normocephalic, atraumatic Eyes-  Sclera clear, conjunctiva pink Ears- hearing intact Oropharynx- clear Lungs- Clear to ausculation bilaterally, decreased air movement at the bases, normal work of breathing Heart- Regular rate and rhythm, no murmurs, rubs or gallops, PMI not laterally displaced GI- soft, NT, ND, + BS Extremities- no clubbing, cyanosis, or edema  Wt Readings from Last 3 Encounters:  09/25/18 152 lb (68.9 kg)  09/23/18 151 lb 14.4 oz (68.9 kg)  09/08/18 152 lb (68.9 kg)    EKG tracing from today is personally reviewed and shows sinus rhythm  Assessment and Plan:  1. Paroxsymal atrial fibrillation Doing well post  ablation chads2vasc score is 3.  On xarelto  Return to AF clinic every 3-4 months  Thompson Grayer MD, [facility] 09/25/2018 11:22 AM

## 2018-09-25 NOTE — Assessment & Plan Note (Signed)
She continued to experience asthma exacerbations resulting in multiple ED visits. She was asking to see a pulmonologist.  Referral was provided to see a pulmonologist. Continue Spiriva with Xopenex as needed.

## 2018-09-26 ENCOUNTER — Telehealth: Payer: Self-pay | Admitting: Internal Medicine

## 2018-09-26 NOTE — Telephone Encounter (Signed)
Patient want to get a lower dosage on antibiotics, she is scared it is going to miss with her heart, pls contact [phone]

## 2018-09-27 NOTE — Telephone Encounter (Signed)
Augmentin should not cause a problem with her heart.

## 2018-09-27 NOTE — Progress Notes (Signed)
Internal Medicine Clinic Attending  Case discussed with Dr. Amin at the time of the visit.  We reviewed the resident's history and exam and pertinent patient test results.  I agree with the assessment, diagnosis, and plan of care documented in the resident's note.    

## 2018-09-29 NOTE — Telephone Encounter (Signed)
Pt aware. No further acttion needed, phone call complete.Shelby Hidden Cassady2/7/20202:28 PM

## 2018-10-03 ENCOUNTER — Emergency Department (HOSPITAL_COMMUNITY)
Admission: EM | Admit: 2018-10-03 | Discharge: 2018-10-03 | Disposition: A | Discharge status: Home / Self Care | Payer: Medicare Other | Attending: Emergency Medicine | Admitting: Emergency Medicine

## 2018-10-03 ENCOUNTER — Telehealth (HOSPITAL_COMMUNITY): Payer: Self-pay | Admitting: *Deleted

## 2018-10-03 ENCOUNTER — Other Ambulatory Visit: Payer: Self-pay

## 2018-10-03 ENCOUNTER — Emergency Department (HOSPITAL_COMMUNITY)
Admission: EM | Admit: 2018-10-03 | Discharge: 2018-10-04 | Disposition: A | Discharge status: Home / Self Care | Payer: Medicare Other | Source: Home / Self Care | Attending: Emergency Medicine | Admitting: Emergency Medicine

## 2018-10-03 ENCOUNTER — Emergency Department (HOSPITAL_COMMUNITY): Payer: Medicare Other

## 2018-10-03 ENCOUNTER — Encounter (HOSPITAL_COMMUNITY): Payer: Self-pay | Admitting: Emergency Medicine

## 2018-10-03 DIAGNOSIS — Z87891 Personal history of nicotine dependence: Secondary | ICD-10-CM

## 2018-10-03 DIAGNOSIS — R002 Palpitations: Secondary | ICD-10-CM

## 2018-10-03 DIAGNOSIS — J45909 Unspecified asthma, uncomplicated: Secondary | ICD-10-CM

## 2018-10-03 DIAGNOSIS — I480 Paroxysmal atrial fibrillation: Secondary | ICD-10-CM

## 2018-10-03 DIAGNOSIS — Z7901 Long term (current) use of anticoagulants: Secondary | ICD-10-CM

## 2018-10-03 DIAGNOSIS — Z79899 Other long term (current) drug therapy: Secondary | ICD-10-CM | POA: Insufficient documentation

## 2018-10-03 DIAGNOSIS — J45901 Unspecified asthma with (acute) exacerbation: Secondary | ICD-10-CM | POA: Diagnosis not present

## 2018-10-03 DIAGNOSIS — K5900 Constipation, unspecified: Secondary | ICD-10-CM | POA: Insufficient documentation

## 2018-10-03 DIAGNOSIS — I10 Essential (primary) hypertension: Secondary | ICD-10-CM | POA: Insufficient documentation

## 2018-10-03 DIAGNOSIS — R0602 Shortness of breath: Secondary | ICD-10-CM | POA: Insufficient documentation

## 2018-10-03 DIAGNOSIS — R079 Chest pain, unspecified: Secondary | ICD-10-CM | POA: Diagnosis not present

## 2018-10-03 DIAGNOSIS — R05 Cough: Secondary | ICD-10-CM | POA: Diagnosis not present

## 2018-10-03 LAB — COMPREHENSIVE METABOLIC PANEL
ALT: 28 U/L (ref 0–44)
AST: 19 U/L (ref 15–41)
Albumin: 3.1 g/dL — ABNORMAL LOW (ref 3.5–5.0)
Alkaline Phosphatase: 69 U/L (ref 38–126)
Anion gap: 9 (ref 5–15)
BUN: 8 mg/dL (ref 8–23)
CHLORIDE: 106 mmol/L (ref 98–111)
CO2: 24 mmol/L (ref 22–32)
Calcium: 9.3 mg/dL (ref 8.9–10.3)
Creatinine, Ser: 0.87 mg/dL (ref 0.44–1.00)
GFR calc Af Amer: >60 mL/min (ref >60)
GFR calc non Af Amer: >60 mL/min (ref >60)
Glucose, Bld: 121 mg/dL — ABNORMAL HIGH (ref 70–99)
Potassium: 3.3 mmol/L — ABNORMAL LOW (ref 3.5–5.1)
Sodium: 139 mmol/L (ref 135–145)
Total Bilirubin: 0.5 mg/dL (ref 0.3–1.2)
Total Protein: 6.4 g/dL — ABNORMAL LOW (ref 6.5–8.1)

## 2018-10-03 LAB — CBC
HCT: 38.2 % (ref 36.0–46.0)
HCT: 39.9 % (ref 36.0–46.0)
Hemoglobin: 11.9 g/dL — ABNORMAL LOW (ref 12.0–15.0)
Hemoglobin: 12.9 g/dL (ref 12.0–15.0)
MCH: 25.6 pg — ABNORMAL LOW (ref 26.0–34.0)
MCH: 26.3 pg (ref 26.0–34.0)
MCHC: 31.2 g/dL (ref 30.0–36.0)
MCHC: 32.3 g/dL (ref 30.0–36.0)
MCV: 82.2 fL (ref 80.0–100.0)
MCV: 81.3 fL (ref 80.0–100.0)
Platelets: 226 10*3/uL (ref 150–400)
Platelets: 266 10*3/uL (ref 150–400)
RBC: 4.65 MIL/uL (ref 3.87–5.11)
RBC: 4.91 MIL/uL (ref 3.87–5.11)
RDW: 15.7 % — ABNORMAL HIGH (ref 11.5–15.5)
RDW: 15.4 % (ref 11.5–15.5)
WBC: 5.9 10*3/uL (ref 4.0–10.5)
WBC: 4.8 10*3/uL (ref 4.0–10.5)
nRBC: 0 % (ref 0.0–0.2)
nRBC: 0 % (ref 0.0–0.2)

## 2018-10-03 LAB — BASIC METABOLIC PANEL
Anion gap: 12 (ref 5–15)
BUN: 10 mg/dL (ref 8–23)
CO2: 24 mmol/L (ref 22–32)
Calcium: 9.5 mg/dL (ref 8.9–10.3)
Chloride: 103 mmol/L (ref 98–111)
Creatinine, Ser: 0.78 mg/dL (ref 0.44–1.00)
GFR calc Af Amer: >60 mL/min (ref >60)
GFR calc non Af Amer: >60 mL/min (ref >60)
Glucose, Bld: 139 mg/dL — ABNORMAL HIGH (ref 70–99)
POTASSIUM: 3.4 mmol/L — AB (ref 3.5–5.1)
Sodium: 139 mmol/L (ref 135–145)

## 2018-10-03 LAB — I-STAT TROPONIN, ED
Troponin i, poc: 0 ng/mL (ref 0.00–0.08)
Troponin i, poc: 0 ng/mL (ref 0.00–0.08)

## 2018-10-03 LAB — BRAIN NATRIURETIC PEPTIDE: B NATRIURETIC PEPTIDE 5: 15.6 pg/mL (ref 0.0–100.0)

## 2018-10-03 MED ORDER — HYDROCODONE-ACETAMINOPHEN 5-325 MG PO TABS
1.0000 | ORAL_TABLET | Freq: Once | ORAL | Status: AC
  Administered 2018-10-03: 1 via ORAL
  Filled 2018-10-03: qty 1

## 2018-10-03 MED ORDER — LIDOCAINE VISCOUS HCL 2 % MT SOLN
15.0000 mL | Freq: Once | OROMUCOSAL | Status: DC
  Filled 2018-10-03: qty 15

## 2018-10-03 MED ORDER — ONDANSETRON HCL 4 MG/2ML IJ SOLN
4.0000 mg | Freq: Once | INTRAMUSCULAR | Status: AC
  Administered 2018-10-03: 4 mg via INTRAVENOUS
  Filled 2018-10-03: qty 2

## 2018-10-03 MED ORDER — HYDROCODONE-ACETAMINOPHEN 5-325 MG PO TABS: 2.0000 | ORAL_TABLET | Freq: Four times a day (QID) | ORAL | 0 refills | Status: DC | PRN

## 2018-10-03 MED ORDER — LIDOCAINE VISCOUS HCL 2 % MT SOLN
15.0000 mL | Freq: Once | OROMUCOSAL | Status: AC
  Administered 2018-10-03: 15 mL via OROMUCOSAL
  Filled 2018-10-03: qty 15

## 2018-10-03 MED ORDER — POTASSIUM CHLORIDE CRYS ER 20 MEQ PO TBCR
40.0000 meq | EXTENDED_RELEASE_TABLET | Freq: Once | ORAL | Status: AC
  Administered 2018-10-03: 40 meq via ORAL
  Filled 2018-10-03: qty 2

## 2018-10-03 MED ORDER — SODIUM CHLORIDE 0.9 % IV BOLUS
1000.0000 mL | Freq: Once | INTRAVENOUS | Status: AC
  Administered 2018-10-03: 1000 mL via INTRAVENOUS

## 2018-10-03 MED ORDER — ALUM & MAG HYDROXIDE-SIMETH 200-200-20 MG/5ML PO SUSP
30.0000 mL | Freq: Once | ORAL | Status: AC
  Administered 2018-10-03: 30 mL via ORAL
  Filled 2018-10-03: qty 30

## 2018-10-03 MED ORDER — ACETAMINOPHEN 500 MG PO TABS: 1000.0000 mg | ORAL_TABLET | Freq: Once | ORAL | Status: DC

## 2018-10-03 MED ORDER — SODIUM CHLORIDE 0.9% FLUSH: 3.0000 mL | Freq: Once | INTRAVENOUS | Status: DC

## 2018-10-03 NOTE — ED Provider Notes (Signed)
[facility] EMERGENCY DEPARTMENT Provider Note   CSN: [id number] Arrival date & time: 10/03/18  2202     History   Chief Complaint Chief Complaint  Patient presents with  . Fever  . Atrial Fibrillation    HPI [name] is a 68 y.o. female.  The history is provided by the patient.  She has a history of hypertension, GERD, paroxysmal atrial fibrillation anticoagulated on rivaroxaban and comes in because of episodes of palpitations.  She was in the emergency department this morning and states that on going home she took her dose of Augmentin which she is taking for sinus infection.  She also took her usual medications.  This evening, she noted that if anytime she laid down, her heart would start racing and jumping.  It seems to be fine as long as she was up and moving.  She denies chest pain, heaviness, tightness, pressure.  She denies dyspnea.  She has been having subjective fever as well as chills and sweats.  She has also been having nausea and vomiting through the day.  She complains that she is constipated and the constipation has not improved in spite of drinking prune juice and taking milk of magnesia.  She is also complaining of a discomfort in her upper chest and throat.  She states it is like heartburn and is taking Tums but it is not getting any better.  Past Medical History:  Diagnosis Date  . A-fib ([place])   . Allergy    seasonal  . Asthma   . Chronic anticoagulation   . Eczema   . GERD (gastroesophageal reflux disease)   . Hypertension   . PAF (paroxysmal atrial fibrillation) ([place]) 11/26/2015   April/May 2017: Multiple ED visits for A. Fib.  02/12/16: Improved on diltiazem IV. Cardiology consulted in the ED and discharge patient with diltiazem XR 120 mg once daily with 60 mg by mouth every 6 hours as needed for symptomatic palpitations, flecainide 50 mg twice daily. Referred to the atrial fibrillation clinic.  02/17/16: Seen in the atrial fibrillation  clinic by NP Roderic Palau. Normal sin    Patient Active Problem List   Diagnosis Date Noted  . History of radiofrequency ablation (RFA) procedure for cardiac arrhythmia   . Asthma exacerbation 08/28/2018  . Chronic congestion of paranasal sinus 06/14/2018  . Primary osteoarthritis involving multiple joints 06/14/2018  . Acute pain of left knee 05/15/2018  . Hyperpigmented skin lesion 09/09/2017  . Osteoporosis without current pathological fracture 04/15/2017  . Normocytic anemia 10/19/2016  . Hyperlipidemia 03/18/2016  . Special screening for malignant neoplasms, colon 03/17/2016  . Cervical cancer screening 03/17/2016  . Assistance with transportation 03/17/2016  . HTN (hypertension), benign   . GERD (gastroesophageal reflux disease)   . PAF (paroxysmal atrial fibrillation) ([place]) 11/26/2015  . Chronic anticoagulation 11/26/2015  . Marijuana abuse 06/11/2015    Past Surgical History:  Procedure Laterality Date  . ATRIAL FIBRILLATION ABLATION N/A 09/30/2017   Procedure: ATRIAL FIBRILLATION ABLATION;  Surgeon: Thompson Grayer, MD;  Location: [facility];  Service: Cardiovascular;  Laterality: N/A;  . TONSILLECTOMY    . TUBAL LIGATION       OB History   No obstetric history on file.      Home Medications    Prior to Admission medications   Medication Sig Start Date End Date Taking? Authorizing Provider  acetaminophen (TYLENOL) 500 MG tablet Take 1,000 mg by mouth 2 (two) times daily as needed for headache (  pain).    [provider]  diclofenac sodium (VOLTAREN) 1 % GEL Apply 2 g topically 4 (four) times daily. Patient not taking: Reported on 10/03/2018 06/13/18   Modena Nunnery D, DO  dicyclomine (BENTYL) 20 MG tablet Take 1 tablet (20 mg total) by mouth 2 (two) times daily. Patient taking differently: Take 20 mg by mouth every other day.  01/25/18   Carlisle Cater, PA-C  diltiazem (CARTIA XT) 120 MG 24 hr capsule TAKE 1 CAPSULE(120 MG) BY MOUTH EVERY  EVENING Patient taking differently: Take 120 mg by mouth every evening.  08/31/18   Sherran Needs, NP  esomeprazole (NEXIUM) 20 MG capsule Take 2 capsules (40 mg total) by mouth daily. Patient taking differently: Take 40 mg by mouth every other day.  06/13/18 12/10/18  [place], Carley D, DO  fexofenadine (ALLEGRA ALLERGY) 60 MG tablet Take 1 tablet (60 mg total) by mouth 2 (two) times daily. 09/08/18   Kathi Ludwig, MD  fluticasone (FLOVENT HFA) 44 MCG/ACT inhaler Inhale 1 puff into the lungs 2 (two) times daily.    [provider]  HYDROcodone-acetaminophen (NORCO) 5-325 MG tablet Take 2 tablets by mouth every 6 (six) hours as needed for up to 3 days for moderate pain. 10/03/18 10/06/18  Seawell, Jaimie A, DO  levalbuterol (XOPENEX HFA) 45 MCG/ACT inhaler Inhale 1 puff into the lungs every 6 (six) hours as needed for shortness of breath. 11/21/17   Carmin Muskrat, MD  levalbuterol Penne Lash) 1.25 MG/3ML nebulizer solution Take 1.25 mg by nebulization every 4 (four) hours as needed for wheezing. 09/23/18 09/23/19  [place]olo, Adam, DO  losartan (COZAAR) 50 MG tablet TAKE 1 TABLET(50 MG) BY MOUTH DAILY Patient taking differently: Take 50 mg by mouth daily.  09/04/18   [name], MD  magnesium hydroxide (MILK OF MAGNESIA) 400 MG/5ML suspension Take 15-30 mLs by mouth at bedtime as needed for mild constipation. 11/01/17   Harris, Abigail, PA-C  montelukast (SINGULAIR) 10 MG tablet Take 1 tablet (10 mg total) by mouth at bedtime. 05/16/18   [name], MD  Multiple Vitamin (MULTIVITAMIN WITH MINERALS) TABS tablet Take 1 tablet by mouth every other day.    [provider]  polyethylene glycol powder (GLYCOLAX/MIRALAX) powder Mix 17g of Miralax in 8 ounces of water every other day Patient not taking: Reported on 10/03/2018 07/20/17   [name], MD  potassium chloride (K-DUR,KLOR-CON) 10 MEQ tablet Take 1 tablet (10 mEq total) by mouth 2 (two) times daily. 03/08/18   Sherran Needs, NP  rivaroxaban (XARELTO) 20 MG TABS tablet Take 1 tablet (20 mg total) by mouth daily with supper. 08/31/18   Sherran Needs, NP  simethicone (GAS-X) 80 MG chewable tablet Chew 1 tablet (80 mg total) by mouth every 6 (six) hours as needed for flatulence. 11/01/17   Harris, Vernie Shanks, PA-C  sucralfate (CARAFATE) 1 GM/10ML suspension Take 10 mLs (1 g total) by mouth 4 (four) times daily. 01/30/18 01/30/19  Melanee Spry, MD  tiotropium (SPIRIVA) 18 MCG inhalation capsule Place 1 capsule (18 mcg total) into inhaler and inhale daily. 05/26/18   [name], MD  traMADol (ULTRAM) 50 MG tablet Take 1 tablet (50 mg total) by mouth every 6 (six) hours as needed. Patient not taking: Reported on 10/03/2018 09/25/18 09/25/19  [name], MD  triamcinolone cream (KENALOG) 0.1 % Apply 1 application topically 2 (two) times daily. Patient taking differently: Apply 1 application topically 2 (two) times daily as needed (rash/itching).  05/06/18  Henderly, Britni A, PA-C    Family History Family History  Problem Relation Age of Onset  . Heart attack Mother        [name] age  . Breast cancer Sister        Late 23s; now s/p mastectomy   . Lung cancer Brother        x 2  . Cancer Sister   . Cancer Sister   . Cancer Sister   . Colon cancer Neg Hx   . Colon polyps Neg Hx   . Esophageal cancer Neg Hx   . Rectal cancer Neg Hx   . Stomach cancer Neg Hx     Social History Social History   Tobacco Use  . Smoking status: Former Smoker    Last attempt to quit: 10/24/2015    Years since quitting: 2.9  . Smokeless tobacco: Never Used  Substance Use Topics  . Alcohol use: No  . Drug use: No    Types: Marijuana    Comment: Smoked marijuana in the past.     Allergies   Albuterol; Celecoxib; and Protonix [pantoprazole sodium]   Review of Systems Review of Systems  All other systems reviewed and are negative.    Physical Exam Updated Vital Signs BP (!) 134/92 (BP Location: Right Arm)   Pulse  (!) 117   Temp 99.1 F (37.3 C) (Oral)   Resp 16   SpO2 96%   Physical Exam Vitals signs and nursing note reviewed.    68 year old female, resting comfortably and in no acute distress. Vital signs are significant for elevated heart rate and borderline elevated diastolic blood pressure (at the time I am evaluating the patient, her heart rate is actually normal at 94). Oxygen saturation is 96%, which is normal. Head is normocephalic and atraumatic. PERRLA, EOMI. Oropharynx is clear. Neck is nontender and supple without adenopathy or JVD. Back is nontender and there is no CVA tenderness. Lungs are clear without rales, wheezes, or rhonchi. Chest is nontender. Heart has regular rate and rhythm without murmur. Abdomen is soft, flat, nontender without masses or hepatosplenomegaly and peristalsis is normoactive. Extremities have no cyanosis or edema, full range of motion is present. Skin is warm and dry without rash. Neurologic: Mental status is normal, cranial nerves are intact, there are no motor or sensory deficits.  ED Treatments / Results  Labs (all labs ordered are listed, but only abnormal results are displayed) Labs Reviewed  BASIC METABOLIC PANEL - Abnormal; Notable for the following components:      Result Value   Potassium 3.4 (*)    Glucose, Bld 139 (*)    All other components within normal limits  CBC  I-STAT TROPONIN, ED    EKG .ORIANA, HORIUCHI UD:[id number] 03-Oct-2018 22:30:59 [facility]/ED ROUTINE RECORD Sinus tachycardia Otherwise within normal limits When compared with ECG of EARLIER SAME DATE No significant change was found Confirmed by Delora Fuel ([id number]) on 10/03/2018 11:00:32 PM 42mm/s 26mm/mV 100Hz  9.0.4 CID: [id number] Confirmed By: Delora Fuel Vent. rate 103 BPM PR interval * ms QRS duration 88 ms QT/QTc 350/459 ms P-R-T axes 68 69 71 08-Dec-1950 (67 yr) Female Black Room:TRAAC Loc:11 Technician: [id number] Test ind:  Radiology Dg Chest  Port 1 View  Result Date: 10/03/2018 CLINICAL DATA:  Reason for exam: chest pain Patient reports she had sob and upper chest pains this morning. Sob has gone away after breathing treatment but reports still feeling upper chest tightness. Denies any cough. EXAM: PORTABLE  CHEST 1 VIEW COMPARISON:  09/23/2018 FINDINGS: Lungs are well inflated. Heart size is mildly enlarged. There are no focal consolidations or pleural effusions. There is atherosclerotic calcification of the thoracic aorta. IMPRESSION: No evidence for acute cardiopulmonary abnormality. Electronically Signed   By: Nolon Nations M.D.   On: 10/03/2018 09:11    Procedures Procedures  Medications Ordered in ED Medications  sodium chloride flush (NS) 0.9 % injection 3 mL (3 mLs Intravenous Not Given 10/03/18 2235)     Initial Impression / Assessment and Plan / ED Course  I have reviewed the triage vital signs and the nursing notes.  Pertinent labs & imaging results that were available during my care of the patient were reviewed by me and considered in my medical decision making (see chart for details).  Subjective palpitations.  Subjective fever and chills.  Nausea and vomiting.  Suspect underlying viral illness, possible influenza.  Old records reviewed confirming recent ED visit for palpitations but with ECG and monitor only showing sinus rhythm.  Since she complains of symptoms while supine, she is in the emergency department.  She will be given a GI cocktail because of complaints of burning in her throat and upper chest.  Will give dose of magnesium citrate for her constipation.  She feels better after above-noted treatment.  She is discharged with instructions to follow-up with her cardiologist, consider having outpatient Holter monitor or event monitor.  Return precautions discussed.  Final Clinical Impressions(s) / ED Diagnoses   Final diagnoses:  Palpitations  Constipation, unspecified constipation type  Paroxysmal atrial  fibrillation [facility])  Chronic anticoagulation    ED Discharge Orders    None       Delora Fuel, MD 49/67/59 0040

## 2018-10-03 NOTE — ED Notes (Signed)
Pt converted to NSR

## 2018-10-03 NOTE — ED Notes (Signed)
D/c reviewed with patient 

## 2018-10-03 NOTE — Telephone Encounter (Signed)
Pt declined appt to afib clinic since seen in the ED for asthma exacerbation and afib.  Pt stated that she was having more of a problem with the asthma and had used both her inhaler and her nebulizer treatment and is on an abx.  She stated that this caused her flare and she is doing better now.  Pt will call if any changes

## 2018-10-03 NOTE — Discharge Instructions (Addendum)
Please follow-up with your cardiologist for your intermittent atrial fibrillation. If you experience recurrence of your symptoms please do not hesitate to return to the hospital.   For your sinus pain and headache please take Hydrocodone-acetaminophen (NORCO) 5-325 MG tablet every six hours as needed for pain. Please follow-up with your primary care physician if you sinusitis symptoms do not resolve.

## 2018-10-03 NOTE — ED Provider Notes (Signed)
[place] EMERGENCY DEPARTMENT Provider Note   CSN: [id number] Arrival date & time: 10/03/18  4008   History   Chief Complaint No chief complaint on file.   HPI [name] is a 68 y.o. female with paroxysmal afib s/p ablation 09/2017 on anticoagulation, HTN, asthma, and recent sinusitis presenting with feeling that she had mulitiple episodes of afib overnight and this morning and not relieved by taking her prn diltiazem. She states she woke up feeling as if her heart were fluttering and had chest pain. She states she has rarely had episodes of afib since her ablation last year. She tried to take her Dilt 60mg  at 3am and felt better for a short while. She took another 60mg  at [facility] when her symptoms had not resolved. She reports mild SOB but feels this is related to her asthma. She was recently seen in the ED and by her PCP for sinusitis and asthma exacerbation and was started on prednisone, which she completed and augment, which she is still taking. She has not been taking the tramadol that was prescribed because it makes her nauseous. She states she continues to have sinus pain and congestion and sore throat, cough, ear pain, or eye pain.    HPI  Past Medical History:  Diagnosis Date  . A-fib ([place])   . Allergy    seasonal  . Asthma   . Chronic anticoagulation   . Eczema   . GERD (gastroesophageal reflux disease)   . Hypertension   . PAF (paroxysmal atrial fibrillation) ([place]) 11/26/2015   April/May 2017: Multiple ED visits for A. Fib.  02/12/16: Improved on diltiazem IV. Cardiology consulted in the ED and discharge patient with diltiazem XR 120 mg once daily with 60 mg by mouth every 6 hours as needed for symptomatic palpitations, flecainide 50 mg twice daily. Referred to the atrial fibrillation clinic.  02/17/16: Seen in the atrial fibrillation clinic by NP Roderic Palau. Normal sin    Patient Active Problem List   Diagnosis Date Noted  . History of radiofrequency  ablation (RFA) procedure for cardiac arrhythmia   . Asthma exacerbation 08/28/2018  . Chronic congestion of paranasal sinus 06/14/2018  . Primary osteoarthritis involving multiple joints 06/14/2018  . Acute pain of left knee 05/15/2018  . Hyperpigmented skin lesion 09/09/2017  . Osteoporosis without current pathological fracture 04/15/2017  . Normocytic anemia 10/19/2016  . Hyperlipidemia 03/18/2016  . Special screening for malignant neoplasms, colon 03/17/2016  . Cervical cancer screening 03/17/2016  . Assistance with transportation 03/17/2016  . HTN (hypertension), benign   . GERD (gastroesophageal reflux disease)   . PAF (paroxysmal atrial fibrillation) ([place]) 11/26/2015  . Chronic anticoagulation 11/26/2015  . Marijuana abuse 06/11/2015    Past Surgical History:  Procedure Laterality Date  . ATRIAL FIBRILLATION ABLATION N/A 09/30/2017   Procedure: ATRIAL FIBRILLATION ABLATION;  Surgeon: Thompson Grayer, MD;  Location: [facility];  Service: Cardiovascular;  Laterality: N/A;  . TONSILLECTOMY    . TUBAL LIGATION       OB History   No obstetric history on file.      Home Medications    Prior to Admission medications   Medication Sig Start Date End Date Taking? Authorizing Provider  acetaminophen (TYLENOL) 500 MG tablet Take 1,000 mg by mouth 2 (two) times daily as needed for headache (pain).   Yes [provider]  dicyclomine (BENTYL) 20 MG tablet Take 1 tablet (20 mg total) by mouth 2 (two) times daily. Patient taking  differently: Take 20 mg by mouth every other day.  01/25/18  Yes Carlisle Cater, PA-C  diltiazem (CARTIA XT) 120 MG 24 hr capsule TAKE 1 CAPSULE(120 MG) BY MOUTH EVERY EVENING Patient taking differently: Take 120 mg by mouth every evening.  08/31/18  Yes Sherran Needs, NP  esomeprazole (NEXIUM) 20 MG capsule Take 2 capsules (40 mg total) by mouth daily. Patient taking differently: Take 40 mg by mouth every other day.  06/13/18 12/10/18 Yes  [place], Carley D, DO  fexofenadine (ALLEGRA ALLERGY) 60 MG tablet Take 1 tablet (60 mg total) by mouth 2 (two) times daily. 09/08/18  Yes Kathi Ludwig, MD  fluticasone (FLOVENT HFA) 44 MCG/ACT inhaler Inhale 1 puff into the lungs 2 (two) times daily.   Yes [provider]  levalbuterol (XOPENEX HFA) 45 MCG/ACT inhaler Inhale 1 puff into the lungs every 6 (six) hours as needed for shortness of breath. 11/21/17  Yes Carmin Muskrat, MD  levalbuterol Penne Lash) 1.25 MG/3ML nebulizer solution Take 1.25 mg by nebulization every 4 (four) hours as needed for wheezing. 09/23/18 09/23/19 Yes Cur[place]olo, Adam, DO  losartan (COZAAR) 50 MG tablet TAKE 1 TABLET(50 MG) BY MOUTH DAILY Patient taking differently: Take 50 mg by mouth daily.  09/04/18  Yes [name], MD  magnesium hydroxide (MILK OF MAGNESIA) 400 MG/5ML suspension Take 15-30 mLs by mouth at bedtime as needed for mild constipation. 11/01/17  Yes Harris, Abigail, PA-C  montelukast (SINGULAIR) 10 MG tablet Take 1 tablet (10 mg total) by mouth at bedtime. 05/16/18  Yes [name], MD  Multiple Vitamin (MULTIVITAMIN WITH MINERALS) TABS tablet Take 1 tablet by mouth every other day.   Yes [provider]  potassium chloride (K-DUR,KLOR-CON) 10 MEQ tablet Take 1 tablet (10 mEq total) by mouth 2 (two) times daily. 03/08/18  Yes Sherran Needs, NP  rivaroxaban (XARELTO) 20 MG TABS tablet Take 1 tablet (20 mg total) by mouth daily with supper. 08/31/18  Yes Sherran Needs, NP  simethicone (GAS-X) 80 MG chewable tablet Chew 1 tablet (80 mg total) by mouth every 6 (six) hours as needed for flatulence. 11/01/17  Yes Harris, Abigail, PA-C  sucralfate (CARAFATE) 1 GM/10ML suspension Take 10 mLs (1 g total) by mouth 4 (four) times daily. 01/30/18 01/30/19 Yes Lacroce, Hulen Shouts, MD  tiotropium (SPIRIVA) 18 MCG inhalation capsule Place 1 capsule (18 mcg total) into inhaler and inhale daily. 05/26/18  Yes [name], MD  triamcinolone cream  (KENALOG) 0.1 % Apply 1 application topically 2 (two) times daily. Patient taking differently: Apply 1 application topically 2 (two) times daily as needed (rash/itching).  05/06/18  Yes Henderly, Britni A, PA-C  diclofenac sodium (VOLTAREN) 1 % GEL Apply 2 g topically 4 (four) times daily. Patient not taking: Reported on 10/03/2018 06/13/18   Modena Nunnery D, DO  HYDROcodone-acetaminophen (NORCO) 5-325 MG tablet Take 2 tablets by mouth every 6 (six) hours as needed for up to 3 days for moderate pain. 10/03/18 10/06/18  Noa Constante A, DO  polyethylene glycol powder (GLYCOLAX/MIRALAX) powder Mix 17g of Miralax in 8 ounces of water every other day Patient not taking: Reported on 10/03/2018 07/20/17   [name], MD  traMADol (ULTRAM) 50 MG tablet Take 1 tablet (50 mg total) by mouth every 6 (six) hours as needed. Patient not taking: Reported on 10/03/2018 09/25/18 09/25/19  [name], MD    Family History Family History  Problem Relation Age of Onset  . Heart attack Mother  Young age  . Breast cancer Sister        Late 42s; now s/p mastectomy   . Lung cancer Brother        x 2  . Cancer Sister   . Cancer Sister   . Cancer Sister   . Colon cancer Neg Hx   . Colon polyps Neg Hx   . Esophageal cancer Neg Hx   . Rectal cancer Neg Hx   . Stomach cancer Neg Hx     Social History Social History   Tobacco Use  . Smoking status: Former Smoker    Last attempt to quit: 10/24/2015    Years since quitting: 2.9  . Smokeless tobacco: Never Used  Substance Use Topics  . Alcohol use: No  . Drug use: No    Types: Marijuana    Comment: Smoked marijuana in the past.     Allergies   Albuterol; Celecoxib; and Protonix [pantoprazole sodium]   Review of Systems Review of Systems  ROS negative except as noted in HPI.   Physical Exam Updated Vital Signs BP (!) 144/81   Pulse 84   Temp 98.7 F (37.1 C) (Oral)   Resp (!) 24   SpO2 98%   Physical Exam Constitution: NAD,  supine in bed  HENT: AT, [place], TM bilaterally wnl, moist mucous membranes, +TTP ethmoid and maxillary sinuses  Eyes: eom intact, no icterus or injection or watering of eyes  Cardio: irregular rate and rhythm, no m/r/g  Respiratory: decreased breath sounds, no wheezing rales or rhonchi  Abdominal: non-distended, NTTP  MSK: moving all extremities, no edema  Neuro: a&o, cooperative  Skin: c/d/i    ED Treatments / Results  Labs (all labs ordered are listed, but only abnormal results are displayed) Labs Reviewed  CBC - Abnormal; Notable for the following components:      Result Value   Hemoglobin 11.9 (*)    MCH 25.6 (*)    RDW 15.7 (*)    All other components within normal limits  COMPREHENSIVE METABOLIC PANEL - Abnormal; Notable for the following components:   Potassium 3.3 (*)    Glucose, Bld 121 (*)    Total Protein 6.4 (*)    Albumin 3.1 (*)    All other components within normal limits  BRAIN NATRIURETIC PEPTIDE  I-STAT TROPONIN, ED    EKG EKG Interpretation  Date/Time:  Tuesday October 03 2018 08:37:23 EST Ventricular Rate:  88 PR Interval:    QRS Duration: 83 QT Interval:  370 QTC Calculation: 448 R Axis:   35 Text Interpretation:  Sinus rhythm Abnormal R-wave progression, early transition Baseline wander in lead(s) V2 V3 No significant change since last tracing Confirmed by Blanchie Dessert [phone]) on 10/03/2018 9:10:54 AM   Radiology Dg Chest Port 1 View  Result Date: 10/03/2018 CLINICAL DATA:  Reason for exam: chest pain Patient reports she had sob and upper chest pains this morning. Sob has gone away after breathing treatment but reports still feeling upper chest tightness. Denies any cough. EXAM: PORTABLE CHEST 1 VIEW COMPARISON:  09/23/2018 FINDINGS: Lungs are well inflated. Heart size is mildly enlarged. There are no focal consolidations or pleural effusions. There is atherosclerotic calcification of the thoracic aorta. IMPRESSION: No evidence for acute  cardiopulmonary abnormality. Electronically Signed   By: Nolon Nations M.D.   On: 10/03/2018 09:11    Procedures Procedures (including critical care time)  Medications Ordered in ED Medications  acetaminophen (TYLENOL) tablet 1,000 mg (has no administration in time  range)  HYDROcodone-acetaminophen (NORCO/VICODIN) 5-325 MG per tablet 1 tablet (1 tablet Oral Given 10/03/18 1102)  lidocaine (XYLOCAINE) 2 % viscous mouth solution 15 mL (15 mLs Mouth/Throat Given 10/03/18 1102)  potassium chloride SA (K-DUR,KLOR-CON) CR tablet 40 mEq (40 mEq Oral Given 10/03/18 1102)     Initial Impression / Assessment and Plan / ED Course  I have reviewed the triage vital signs and the nursing notes.  Pertinent labs & imaging results that were available during my care of the patient were reviewed by me and considered in my medical decision making (see chart for details).  Clinical Course as of Oct 03 1256  Tue Oct 03, 2018  0915 Patient presenting with likely paroxysmal afib not relieved by her prn diltiazem. Will obtain EKG and labs and imaging as she does endorse chest pain and SOB.    [JS]  [id number] Comment 3:        [JS]  1123 Symptoms of sinus pain improved, sore throat improved. She has had no episodes of atrial fibrillation since presenting and has no chest pain currently, troponin and BNP normal. She has been using her beta agonist inhaler and Discussed using her diltiazem for recurrent symptoms and to return if symptoms continue. She will follow-up closely with her cardiologist and feels stable for discharge.    [JS]    Clinical Course User Index [JS] Marty Heck, DO     Final Clinical Impressions(s) / ED Diagnoses   Final diagnoses:  Paroxysmal atrial fibrillation [facility])  Palpitation    ED Discharge Orders         Ordered    HYDROcodone-acetaminophen (NORCO) 5-325 MG tablet  Every 6 hours PRN     10/03/18 1145           Travares Nelles A, DO 10/03/18 1259    Blanchie Dessert, MD 10/03/18 2058

## 2018-10-03 NOTE — ED Triage Notes (Signed)
Patient with heart racing and she has been having fevers.  She stats that she is having a burning sensation in her chest.

## 2018-10-03 NOTE — ED Triage Notes (Addendum)
Patient reports that she felt her heart "going crazy," about 2am and she took two doses of 60mg  diltiazem.  Patient also reports sinus pressure with chest tightness-she reports taking a breathing treatment, felt better but she back to feeling tightness

## 2018-10-04 ENCOUNTER — Ambulatory Visit (INDEPENDENT_AMBULATORY_CARE_PROVIDER_SITE_OTHER): Payer: Medicare Other | Admitting: Internal Medicine

## 2018-10-04 ENCOUNTER — Ambulatory Visit: Payer: Self-pay | Admitting: *Deleted

## 2018-10-04 ENCOUNTER — Encounter (HOSPITAL_COMMUNITY): Payer: Self-pay | Admitting: Nurse Practitioner

## 2018-10-04 ENCOUNTER — Other Ambulatory Visit: Payer: Self-pay

## 2018-10-04 ENCOUNTER — Ambulatory Visit (HOSPITAL_COMMUNITY)
Admission: RE | Admit: 2018-10-04 | Discharge: 2018-10-04 | Disposition: A | Discharge status: Home / Self Care | Payer: Medicare Other | Source: Ambulatory Visit | Attending: Nurse Practitioner | Admitting: Nurse Practitioner

## 2018-10-04 VITALS — BP 114/73 | HR 95 | Temp 98.2°F | Ht 67.0 in | Wt 152.9 lb

## 2018-10-04 VITALS — BP 116/80 | HR 93 | Ht 67.0 in | Wt 152.0 lb

## 2018-10-04 DIAGNOSIS — Z7901 Long term (current) use of anticoagulants: Secondary | ICD-10-CM | POA: Insufficient documentation

## 2018-10-04 DIAGNOSIS — K219 Gastro-esophageal reflux disease without esophagitis: Secondary | ICD-10-CM | POA: Diagnosis not present

## 2018-10-04 DIAGNOSIS — Z79899 Other long term (current) drug therapy: Secondary | ICD-10-CM | POA: Diagnosis not present

## 2018-10-04 DIAGNOSIS — I10 Essential (primary) hypertension: Secondary | ICD-10-CM | POA: Diagnosis not present

## 2018-10-04 DIAGNOSIS — Z87891 Personal history of nicotine dependence: Secondary | ICD-10-CM | POA: Insufficient documentation

## 2018-10-04 DIAGNOSIS — J45909 Unspecified asthma, uncomplicated: Secondary | ICD-10-CM | POA: Insufficient documentation

## 2018-10-04 DIAGNOSIS — I480 Paroxysmal atrial fibrillation: Secondary | ICD-10-CM | POA: Insufficient documentation

## 2018-10-04 DIAGNOSIS — Z888 Allergy status to other drugs, medicaments and biological substances status: Secondary | ICD-10-CM | POA: Diagnosis not present

## 2018-10-04 DIAGNOSIS — Z823 Family history of stroke: Secondary | ICD-10-CM | POA: Insufficient documentation

## 2018-10-04 DIAGNOSIS — R002 Palpitations: Secondary | ICD-10-CM | POA: Diagnosis not present

## 2018-10-04 MED ORDER — MAGNESIUM CITRATE PO SOLN
1.0000 | Freq: Once | ORAL | Status: AC
  Administered 2018-10-04: 1 via ORAL
  Filled 2018-10-04: qty 296

## 2018-10-04 MED ORDER — PANTOPRAZOLE SODIUM 40 MG PO TBEC: 40.0000 mg | DELAYED_RELEASE_TABLET | Freq: Every day | ORAL | 1 refills | Status: DC

## 2018-10-04 NOTE — Progress Notes (Signed)
Primary Care Physician: [name], MD Referring 38 f/u EP: [name] is a 68 y.o. female with a h/o paroxysmal afib, s/p ablation, 11/2015, asthma seasonal allergies, that is in the afib clinic for palpitations . She was seen in the ER yesterday x2 for palpitations, both times in SR/stach. She has had a sinus congestion and asthma flare and has been using her nebulizer treatments more than usual, finishing up abx. She is in SR today.   Today, she denies symptoms of palpitations, chest pain, shortness of breath, orthopnea, PND, lower extremity edema, dizziness, presyncope, syncope, or neurologic sequela. The patient is tolerating medications without difficulties and is otherwise without complaint today.   Past Medical History:  Diagnosis Date  . A-fib ([place])   . Allergy    seasonal  . Asthma   . Chronic anticoagulation   . Eczema   . GERD (gastroesophageal reflux disease)   . Hypertension   . PAF (paroxysmal atrial fibrillation) ([place]) 11/26/2015   April/May 2017: Multiple ED visits for A. Fib.  02/12/16: Improved on diltiazem IV. Cardiology consulted in the ED and discharge patient with diltiazem XR 120 mg once daily with 60 mg by mouth every 6 hours as needed for symptomatic palpitations, flecainide 50 mg twice daily. Referred to the atrial fibrillation clinic.  02/17/16: Seen in the atrial fibrillation clinic by NP Roderic Palau. Normal sin   Past Surgical History:  Procedure Laterality Date  . ATRIAL FIBRILLATION ABLATION N/A 09/30/2017   Procedure: ATRIAL FIBRILLATION ABLATION;  Surgeon: Thompson Grayer, MD;  Location: [facility];  Service: Cardiovascular;  Laterality: N/A;  . TONSILLECTOMY    . TUBAL LIGATION      Current Outpatient Medications  Medication Sig Dispense Refill  . acetaminophen (TYLENOL) 500 MG tablet Take 1,000 mg by mouth 2 (two) times daily as needed for headache (pain).    Marland Kitchen diclofenac sodium (VOLTAREN) 1 % GEL Apply 2 g  topically 4 (four) times daily. 100 g 3  . dicyclomine (BENTYL) 20 MG tablet Take 1 tablet (20 mg total) by mouth 2 (two) times daily. (Patient taking differently: Take 20 mg by mouth every other day. ) 20 tablet 0  . diltiazem (CARTIA XT) 120 MG 24 hr capsule TAKE 1 CAPSULE(120 MG) BY MOUTH EVERY EVENING (Patient taking differently: Take 120 mg by mouth every evening. ) 90 capsule 6  . esomeprazole (NEXIUM) 20 MG capsule Take 2 capsules (40 mg total) by mouth daily. (Patient taking differently: Take 40 mg by mouth every other day. ) 180 capsule 1  . fluticasone (FLOVENT HFA) 44 MCG/ACT inhaler Inhale 1 puff into the lungs 2 (two) times daily.    Marland Kitchen HYDROcodone-acetaminophen (NORCO) 5-325 MG tablet Take 2 tablets by mouth every 6 (six) hours as needed for up to 3 days for moderate pain. 3 tablet 0  . levalbuterol (XOPENEX HFA) 45 MCG/ACT inhaler Inhale 1 puff into the lungs every 6 (six) hours as needed for shortness of breath. 1 Inhaler 12  . levalbuterol (XOPENEX) 1.25 MG/3ML nebulizer solution Take 1.25 mg by nebulization every 4 (four) hours as needed for wheezing. 72 mL 2  . losartan (COZAAR) 50 MG tablet TAKE 1 TABLET(50 MG) BY MOUTH DAILY (Patient taking differently: Take 50 mg by mouth daily. ) 30 tablet 3  . magnesium hydroxide (MILK OF MAGNESIA) 400 MG/5ML suspension Take 15-30 mLs by mouth at bedtime as needed for mild constipation. 360 mL 0  . Multiple Vitamin (MULTIVITAMIN  WITH MINERALS) TABS tablet Take 1 tablet by mouth every other day.    . potassium chloride (K-DUR,KLOR-CON) 10 MEQ tablet Take 1 tablet (10 mEq total) by mouth 2 (two) times daily. 60 tablet 6  . rivaroxaban (XARELTO) 20 MG TABS tablet Take 1 tablet (20 mg total) by mouth daily with supper. 30 tablet 6  . tiotropium (SPIRIVA) 18 MCG inhalation capsule Place 1 capsule (18 mcg total) into inhaler and inhale daily. 30 capsule 2  . montelukast (SINGULAIR) 10 MG tablet Take 1 tablet (10 mg total) by mouth at bedtime. (Patient  not taking: Reported on 10/04/2018) 90 tablet 1  . polyethylene glycol powder (GLYCOLAX/MIRALAX) powder Mix 17g of Miralax in 8 ounces of water every other day (Patient not taking: Reported on 10/03/2018) 255 g 0  . simethicone (GAS-X) 80 MG chewable tablet Chew 1 tablet (80 mg total) by mouth every 6 (six) hours as needed for flatulence. (Patient not taking: Reported on 10/04/2018) 30 tablet 0  . sucralfate (CARAFATE) 1 GM/10ML suspension Take 10 mLs (1 g total) by mouth 4 (four) times daily. (Patient not taking: Reported on 10/04/2018) 420 mL 1  . traMADol (ULTRAM) 50 MG tablet Take 1 tablet (50 mg total) by mouth every 6 (six) hours as needed. (Patient not taking: Reported on 10/03/2018) 20 tablet 0  . triamcinolone cream (KENALOG) 0.1 % Apply 1 application topically 2 (two) times daily. (Patient taking differently: Apply 1 application topically 2 (two) times daily as needed (rash/itching). ) 30 g 0   No current facility-administered medications for this encounter.     Allergies  Allergen Reactions  . Albuterol Other (See Comments)    Caused afib   . Celecoxib Rash  . Protonix [Pantoprazole Sodium] Palpitations    Social History   Socioeconomic History  . Marital status: Single    Spouse name: Not on file  . Number of children: Not on file  . Years of education: Not on file  . Highest education level: Not on file  Occupational History  . Occupation: Retired  Scientific laboratory technician  . Financial resource strain: Not on file  . Food insecurity:    Worry: Not on file    Inability: Not on file  . Transportation needs:    Medical: Not on file    Non-medical: Not on file  Tobacco Use  . Smoking status: Former Smoker    Last attempt to quit: 10/24/2015    Years since quitting: 2.9  . Smokeless tobacco: Never Used  Substance and Sexual Activity  . Alcohol use: No  . Drug use: No    Types: Marijuana    Comment: Smoked marijuana in the past.  . Sexual activity: Yes    Partners: Male    Birth  control/protection: Post-menopausal  Lifestyle  . Physical activity:    Days per week: Not on file    Minutes per session: Not on file  . Stress: Not on file  Relationships  . Social connections:    Talks on phone: Not on file    Gets together: Not on file    Attends religious service: Not on file    Active member of club or organization: Not on file    Attends meetings of clubs or organizations: Not on file    Relationship status: Not on file  . Intimate partner violence:    Fear of current or ex partner: Not on file    Emotionally abused: Not on file    Physically abused: Not  on file    Forced sexual activity: Not on file  Other Topics Concern  . Not on file  Social History Narrative   Lives alone in [place], [place] and has 2 daughters who are within driving distance   Enjoys watching soap operas, reading, exercise    Family History  Problem Relation Age of Onset  . Heart attack Mother        [name] age  . Breast cancer Sister        Late 18s; now s/p mastectomy   . Lung cancer Brother        x 2  . Cancer Sister   . Cancer Sister   . Cancer Sister   . Colon cancer Neg Hx   . Colon polyps Neg Hx   . Esophageal cancer Neg Hx   . Rectal cancer Neg Hx   . Stomach cancer Neg Hx     ROS- All systems are reviewed and negative except as per the HPI above  Physical Exam: Vitals:   10/04/18 1338  BP: 116/80  Pulse: 93  Weight: 68.9 kg  Height: 5\' 7"  (1.702 m)   Wt Readings from Last 3 Encounters:  10/04/18 68.9 kg  09/25/18 69 kg  09/25/18 68.9 kg    Labs: Lab Results  Component Value Date   NA 139 10/03/2018   K 3.4 (L) 10/03/2018   CL 103 10/03/2018   CO2 24 10/03/2018   GLUCOSE 139 (H) 10/03/2018   BUN 10 10/03/2018   CREATININE 0.78 10/03/2018   CALCIUM 9.5 10/03/2018   PHOS 3.0 04/15/2017   MG 2.1 08/29/2018   Lab Results  Component Value Date   INR 1.03 10/06/2016   Lab Results  Component Value Date   CHOL 245 (H) 03/17/2016   HDL 94  03/17/2016   LDLCALC 132 (H) 03/17/2016   TRIG 95 03/17/2016     GEN- The patient is well appearing, alert and oriented x 3 today.   Head- normocephalic, atraumatic Eyes-  Sclera clear, conjunctiva pink Ears- hearing intact Oropharynx- clear Neck- supple, no JVP Lymph- no cervical lymphadenopathy Lungs- Clear to ausculation bilaterally, normal work of breathing Heart- Regular rate and rhythm, no murmurs, rubs or gallops, PMI not laterally displaced GI- soft, NT, ND, + BS Extremities- no clubbing, cyanosis, or edema MS- no significant deformity or atrophy Skin- no rash or lesion Psych- euthymic mood, full affect Neuro- strength and sensation are intact  EKG-SR with SA at 93 bpm, pr int 110 bpm, qrs int 84 ms, qtc 432 ms Epic records reviewed    Assessment and Plan: 1. Paroxysmal afib S/p ablation 2017 I have not seen any afib documented since pt's ablation I feel that she has had some tachycardia/palps after using her nebulizer treatments a lot over the week end for  wheezing  No change in medications Reassured that the EKG today and in the ER did not show any afib She will try to back off nebulizer treatments Continue  diltiazem  120 mg daily Continue xarelto 20 mg daily for a CHA2DS2VASc score of 3  F/u here in June F/u with PCP as scheduled  Butch Penny C. Carroll, [facility] [address] [place], [place] [address] [phone]

## 2018-10-04 NOTE — Discharge Instructions (Signed)
Return if you are having any problems. 

## 2018-10-04 NOTE — Patient Instructions (Addendum)
[name],   For your acid reflux, please start taking protonix 40 mg (1 tablet).  Please take this in the morning 30 minutes before you eat, this will make the medication have better effect on acid suppression.  Avoid spicy foods as this can worsen your symptoms.  Please avoid laying down after a big meal as well as this can also cause worsening of your reflux.  Please make a follow-up appointment with your regular doctor, Dr. Virgina Norfolk as soon as possible.  Call us if you have any questions or concerns.  -Dr. Frederico Hamman

## 2018-10-05 ENCOUNTER — Encounter: Payer: Self-pay | Admitting: Internal Medicine

## 2018-10-05 ENCOUNTER — Telehealth: Payer: Self-pay

## 2018-10-05 ENCOUNTER — Other Ambulatory Visit: Payer: Self-pay | Admitting: *Deleted

## 2018-10-05 MED ORDER — PANTOPRAZOLE SODIUM 40 MG PO TBEC: 40.0000 mg | DELAYED_RELEASE_TABLET | Freq: Every day | ORAL | 0 refills | Status: DC

## 2018-10-05 NOTE — Progress Notes (Signed)
   CC: Sore throat   HPI:  [name] is a 68 y.o. year-old female with PMH listed below who presents to clinic for sore throat. Please see problem based assessment and plan for further details.   Past Medical History:  Diagnosis Date  . A-fib ([place])   . Allergy    seasonal  . Asthma   . Chronic anticoagulation   . Eczema   . GERD (gastroesophageal reflux disease)   . Hypertension   . PAF (paroxysmal atrial fibrillation) ([place]) 11/26/2015   April/May 2017: Multiple ED visits for A. Fib.  02/12/16: Improved on diltiazem IV. Cardiology consulted in the ED and discharge patient with diltiazem XR 120 mg once daily with 60 mg by mouth every 6 hours as needed for symptomatic palpitations, flecainide 50 mg twice daily. Referred to the atrial fibrillation clinic.  02/17/16: Seen in the atrial fibrillation clinic by NP Roderic Palau. Normal sin   Review of Systems:   Review of Systems  Constitutional: Negative for chills and fever.  HENT: Positive for sore throat. Negative for congestion and sinus pain.   Respiratory: Positive for cough.   Cardiovascular: Positive for chest pain.  Gastrointestinal: Positive for heartburn and nausea. Negative for abdominal pain, constipation, diarrhea and vomiting.    Physical Exam: Vitals:   10/04/18 1435  BP: 114/73  Pulse: 95  Temp: 98.2 F (36.8 C)  TempSrc: Oral  SpO2: 96%  Weight: 152 lb 14.4 oz (69.4 kg)  Height: 5\' 7"  (1.702 m)   General: thin female, well-appearing in no acute distress  HENT: NCAT, neck supple and FROM, no LAD or thyromegaly, OP clear without exudates or erythema Cardiac: regular rate and rhythm, nl S1/S2, no murmurs, rubs or gallops  Pulm: CTAB, no wheezes or crackles, no increased work of breathing on room air  Abd: soft, NTND, bowel sounds are normoactive   Assessment & Plan:   See Encounters Tab for problem based charting.  Patient discussed with Dr. Lynnae January

## 2018-10-05 NOTE — Progress Notes (Signed)
Internal Medicine Clinic Attending  Case discussed with Dr. Santos-Sanchez at the time of the visit.  We reviewed the resident's history and exam and pertinent patient test results.  I agree with the assessment, diagnosis, and plan of care documented in the resident's note.    

## 2018-10-05 NOTE — Telephone Encounter (Signed)
Incoming fax from pt's pharmacy requesting 90-day supply on pantoprazole 40mg  tabs one daily. Rx for 30-day supply was sent in yesterday by physician in [facility].  Will send that physician to see if 90-day supply is appropriate.  Please advise.Regenia Skeeter, Darlene Cassady2/13/202011:22 AM

## 2018-10-05 NOTE — Telephone Encounter (Signed)
Pt states she is allergic to pantoprazole (PROTONIX) 40 MG tablet.  Please call pt back.

## 2018-10-05 NOTE — Telephone Encounter (Signed)
Discussed with DrSantos-if pt is unable to tolerate pantoprazole then she take the nexium. Pt agreed.Despina Hidden Cassady2/13/20205:19 PM

## 2018-10-05 NOTE — Assessment & Plan Note (Signed)
Ms. Haggard presents for evaluation of a 5-day history of sore throat associated with a burning sensation in her chest and a post-prandial dry cough. She has had these symptoms before and was prescribed Nexium for it which helped some but did not relieve symptoms completely. She would like something stronger. I ordered Protonix 40 mg QD and educated patient on how to take it. There is a recorded allergy of palpitations with this medication but patient does not recall this. She is willing to try this medicine. I advised her to call us if she experiences any side effects after starting the medication.

## 2018-10-06 ENCOUNTER — Other Ambulatory Visit: Payer: Self-pay | Admitting: Internal Medicine

## 2018-10-06 ENCOUNTER — Telehealth: Payer: Self-pay | Admitting: *Deleted

## 2018-10-06 MED ORDER — SUCRALFATE 1 GM/10ML PO SUSP: 1.0000 g | Freq: Four times a day (QID) | ORAL | 0 refills | Status: DC

## 2018-10-06 NOTE — Telephone Encounter (Signed)
Needs refill on sucralfate (CARAFATE) 1 GM/10ML suspension [id number] - [place], [place] - [address] AT [address]  ;pt contact [phone]   Pt took all the antibiotics and she is still having pressure in head, nose

## 2018-10-06 NOTE — Telephone Encounter (Signed)
Pt calls and is insisting that she will not take the protonix only the carafate. She states why would she take something that she is allergic to, she states she wants her carafate and she will not take the protonix. Please advise

## 2018-10-06 NOTE — Telephone Encounter (Signed)
She did not complain of head and nose pressure when she saw Dr Frederico Hamman. Carafate will not do anything for those sxs. I will provide one refill of the med and send to Dr Reesa Chew to decide on chronicity of this med.   Dr Frederico Hamman discussed with pt need for PPI and that it does not cause palpitations. Pt had complained of a cough which Dr Frederico Hamman felt was 2/2 GERD. Sarafate will not help the cough either.

## 2018-10-06 NOTE — Telephone Encounter (Signed)
It is going to take more than two days for the new PPI to work. The carafate will not help with stuffiness.

## 2018-10-09 ENCOUNTER — Emergency Department (HOSPITAL_COMMUNITY): Payer: Medicare Other

## 2018-10-09 ENCOUNTER — Other Ambulatory Visit (HOSPITAL_COMMUNITY): Payer: Self-pay | Admitting: *Deleted

## 2018-10-09 ENCOUNTER — Other Ambulatory Visit: Payer: Self-pay

## 2018-10-09 ENCOUNTER — Ambulatory Visit (HOSPITAL_COMMUNITY)
Admission: RE | Admit: 2018-10-09 | Discharge: 2018-10-09 | Disposition: A | Discharge status: Home / Self Care | Payer: Medicare Other | Source: Ambulatory Visit | Attending: Nurse Practitioner | Admitting: Nurse Practitioner

## 2018-10-09 ENCOUNTER — Encounter (HOSPITAL_COMMUNITY): Payer: Self-pay | Admitting: *Deleted

## 2018-10-09 ENCOUNTER — Emergency Department (HOSPITAL_COMMUNITY)
Admission: EM | Admit: 2018-10-09 | Discharge: 2018-10-09 | Disposition: A | Discharge status: Home / Self Care | Payer: Medicare Other | Attending: Emergency Medicine | Admitting: Emergency Medicine

## 2018-10-09 ENCOUNTER — Ambulatory Visit (HOSPITAL_COMMUNITY): Payer: Self-pay | Admitting: Nurse Practitioner

## 2018-10-09 ENCOUNTER — Telehealth (HOSPITAL_COMMUNITY): Payer: Self-pay | Admitting: *Deleted

## 2018-10-09 DIAGNOSIS — R002 Palpitations: Secondary | ICD-10-CM | POA: Insufficient documentation

## 2018-10-09 DIAGNOSIS — I4891 Unspecified atrial fibrillation: Secondary | ICD-10-CM | POA: Diagnosis not present

## 2018-10-09 DIAGNOSIS — Z7901 Long term (current) use of anticoagulants: Secondary | ICD-10-CM | POA: Insufficient documentation

## 2018-10-09 DIAGNOSIS — I480 Paroxysmal atrial fibrillation: Secondary | ICD-10-CM

## 2018-10-09 DIAGNOSIS — R58 Hemorrhage, not elsewhere classified: Secondary | ICD-10-CM | POA: Diagnosis not present

## 2018-10-09 DIAGNOSIS — R064 Hyperventilation: Secondary | ICD-10-CM | POA: Diagnosis not present

## 2018-10-09 DIAGNOSIS — Z87891 Personal history of nicotine dependence: Secondary | ICD-10-CM | POA: Insufficient documentation

## 2018-10-09 DIAGNOSIS — I10 Essential (primary) hypertension: Secondary | ICD-10-CM | POA: Insufficient documentation

## 2018-10-09 DIAGNOSIS — J45909 Unspecified asthma, uncomplicated: Secondary | ICD-10-CM | POA: Insufficient documentation

## 2018-10-09 DIAGNOSIS — R079 Chest pain, unspecified: Secondary | ICD-10-CM | POA: Diagnosis not present

## 2018-10-09 DIAGNOSIS — Z79899 Other long term (current) drug therapy: Secondary | ICD-10-CM | POA: Insufficient documentation

## 2018-10-09 DIAGNOSIS — N939 Abnormal uterine and vaginal bleeding, unspecified: Secondary | ICD-10-CM | POA: Diagnosis not present

## 2018-10-09 DIAGNOSIS — N950 Postmenopausal bleeding: Secondary | ICD-10-CM | POA: Diagnosis not present

## 2018-10-09 DIAGNOSIS — R0689 Other abnormalities of breathing: Secondary | ICD-10-CM | POA: Diagnosis not present

## 2018-10-09 LAB — COMPREHENSIVE METABOLIC PANEL
ALK PHOS: 86 U/L (ref 38–126)
ALT: 48 U/L — ABNORMAL HIGH (ref 0–44)
ANION GAP: 8 (ref 5–15)
AST: 30 U/L (ref 15–41)
Albumin: 3.2 g/dL — ABNORMAL LOW (ref 3.5–5.0)
BUN: 9 mg/dL (ref 8–23)
CO2: 25 mmol/L (ref 22–32)
Calcium: 9 mg/dL (ref 8.9–10.3)
Chloride: 108 mmol/L (ref 98–111)
Creatinine, Ser: 0.93 mg/dL (ref 0.44–1.00)
GFR calc Af Amer: >60 mL/min (ref >60)
GFR calc non Af Amer: >60 mL/min (ref >60)
Glucose, Bld: 107 mg/dL — ABNORMAL HIGH (ref 70–99)
Potassium: 3.2 mmol/L — ABNORMAL LOW (ref 3.5–5.1)
SODIUM: 141 mmol/L (ref 135–145)
Total Bilirubin: 0.6 mg/dL (ref 0.3–1.2)
Total Protein: 6.7 g/dL (ref 6.5–8.1)

## 2018-10-09 LAB — CBC WITH DIFFERENTIAL/PLATELET
Abs Immature Granulocytes: 0.02 10*3/uL (ref 0.00–0.07)
BASOS ABS: 0 10*3/uL (ref 0.0–0.1)
Basophils Relative: 0 %
EOS ABS: 0.3 10*3/uL (ref 0.0–0.5)
Eosinophils Relative: 5 %
HCT: 36.6 % (ref 36.0–46.0)
Hemoglobin: 11.6 g/dL — ABNORMAL LOW (ref 12.0–15.0)
IMMATURE GRANULOCYTES: 0 %
Lymphocytes Relative: 24 %
Lymphs Abs: 1.4 10*3/uL (ref 0.7–4.0)
MCH: 26.3 pg (ref 26.0–34.0)
MCHC: 31.7 g/dL (ref 30.0–36.0)
MCV: 83 fL (ref 80.0–100.0)
Monocytes Absolute: 0.5 10*3/uL (ref 0.1–1.0)
Monocytes Relative: 8 %
Neutro Abs: 3.6 10*3/uL (ref 1.7–7.7)
Neutrophils Relative %: 63 %
Platelets: 282 10*3/uL (ref 150–400)
RBC: 4.41 MIL/uL (ref 3.87–5.11)
RDW: 15.9 % — AB (ref 11.5–15.5)
WBC: 5.8 10*3/uL (ref 4.0–10.5)
nRBC: 0 % (ref 0.0–0.2)

## 2018-10-09 LAB — URINALYSIS, ROUTINE W REFLEX MICROSCOPIC
Bilirubin Urine: NEGATIVE
Glucose, UA: NEGATIVE mg/dL
KETONES UR: 5 mg/dL — AB
Nitrite: NEGATIVE
Protein, ur: NEGATIVE mg/dL
Specific Gravity, Urine: 1.014 (ref 1.005–1.030)
pH: 7 (ref 5.0–8.0)

## 2018-10-09 LAB — CBC
HEMATOCRIT: 35.3 % — AB (ref 36.0–46.0)
Hemoglobin: 11.3 g/dL — ABNORMAL LOW (ref 12.0–15.0)
MCH: 26.7 pg (ref 26.0–34.0)
MCHC: 32 g/dL (ref 30.0–36.0)
MCV: 83.5 fL (ref 80.0–100.0)
Platelets: 260 10*3/uL (ref 150–400)
RBC: 4.23 MIL/uL (ref 3.87–5.11)
RDW: 15.9 % — AB (ref 11.5–15.5)
WBC: 5.5 10*3/uL (ref 4.0–10.5)
nRBC: 0 % (ref 0.0–0.2)

## 2018-10-09 LAB — I-STAT TROPONIN, ED
Troponin i, poc: 0.01 ng/mL (ref 0.00–0.08)
Troponin i, poc: 0.01 ng/mL (ref 0.00–0.08)

## 2018-10-09 MED ORDER — DILTIAZEM HCL ER COATED BEADS 120 MG PO CP24: 120.0000 mg | ORAL_CAPSULE | Freq: Two times a day (BID) | ORAL | 6 refills | Status: DC

## 2018-10-09 MED ORDER — POTASSIUM CHLORIDE CRYS ER 20 MEQ PO TBCR
40.0000 meq | EXTENDED_RELEASE_TABLET | Freq: Once | ORAL | Status: AC
  Administered 2018-10-09: 40 meq via ORAL
  Filled 2018-10-09: qty 2

## 2018-10-09 NOTE — Telephone Encounter (Signed)
sucralfate (CARAFATE) 1 GM/10ML suspension, refill request @  [facility], [place] AT [address] [phone] (Phone) [phone] (Fax)

## 2018-10-09 NOTE — Discharge Instructions (Signed)
It is unclear where your bleeding is coming from.  If you develop recurrent or severe bleeding, dizziness, chest pain, shortness of breath, abdominal pain, or any other new/concerning symptoms then return to the ER for evaluation.  Be sure to only take one as needed dose of diltiazem for palpitations, after that you need to call your doctor for further evaluation.

## 2018-10-09 NOTE — Telephone Encounter (Signed)
Patient called from ER stating had fluttering all weekend took a lot of diltiazem 60mg  to help break it. She is in normal rhythm upon arrival to ER. Discussed with Roderic Palau NP - she recommends stopping by here in afib clinic after discharge to have a zio patch placed and we will increase her diltiazem 120mg  to BID. Pt will stop by after d/c

## 2018-10-09 NOTE — ED Triage Notes (Signed)
PT reports rectal bleeding since Sunday.

## 2018-10-09 NOTE — ED Provider Notes (Signed)
[facility] EMERGENCY DEPARTMENT Provider Note   CSN: [id number] Arrival date & time: 10/09/18  9450     History   Chief Complaint Chief Complaint  Patient presents with  . Palpitations    HPI [name] is a 68 y.o. female.  HPI  68 year old female presents with palpitations and bleeding.  The patient states that she has been feeling palpitations since yesterday and felt them all day.  She took 5 different doses of diltiazem as she was told she could take diltiazem as needed for palpitations.  It never seemed to help.  This morning palpitations again recurred around 3:30 AM.  She is been having some light, sharp chest pain as well since around 6 or so.  She denies any dyspnea or dizziness.  She is also noticed heavy bleeding that she states is coming from her vagina.  This also started yesterday.  She denies that it is rectal bleeding or there is any urinary symptoms.  Past Medical History:  Diagnosis Date  . A-fib ([place])   . Allergy    seasonal  . Asthma   . Chronic anticoagulation   . Eczema   . GERD (gastroesophageal reflux disease)   . Hypertension   . PAF (paroxysmal atrial fibrillation) ([place]) 11/26/2015   April/May 2017: Multiple ED visits for A. Fib.  02/12/16: Improved on diltiazem IV. Cardiology consulted in the ED and discharge patient with diltiazem XR 120 mg once daily with 60 mg by mouth every 6 hours as needed for symptomatic palpitations, flecainide 50 mg twice daily. Referred to the atrial fibrillation clinic.  02/17/16: Seen in the atrial fibrillation clinic by NP Roderic Palau. Normal sin    Patient Active Problem List   Diagnosis Date Noted  . History of radiofrequency ablation (RFA) procedure for cardiac arrhythmia   . Asthma exacerbation 08/28/2018  . Chronic congestion of paranasal sinus 06/14/2018  . Primary osteoarthritis involving multiple joints 06/14/2018  . Acute pain of left knee 05/15/2018  . Hyperpigmented skin lesion  09/09/2017  . Osteoporosis without current pathological fracture 04/15/2017  . Normocytic anemia 10/19/2016  . Hyperlipidemia 03/18/2016  . Special screening for malignant neoplasms, colon 03/17/2016  . Cervical cancer screening 03/17/2016  . Assistance with transportation 03/17/2016  . HTN (hypertension), benign   . GERD (gastroesophageal reflux disease)   . PAF (paroxysmal atrial fibrillation) ([place]) 11/26/2015  . Chronic anticoagulation 11/26/2015  . Marijuana abuse 06/11/2015    Past Surgical History:  Procedure Laterality Date  . ATRIAL FIBRILLATION ABLATION N/A 09/30/2017   Procedure: ATRIAL FIBRILLATION ABLATION;  Surgeon: Thompson Grayer, MD;  Location: [facility];  Service: Cardiovascular;  Laterality: N/A;  . TONSILLECTOMY    . TUBAL LIGATION       OB History   No obstetric history on file.      Home Medications    Prior to Admission medications   Medication Sig Start Date End Date Taking? Authorizing Provider  acetaminophen (TYLENOL) 500 MG tablet Take 1,000 mg by mouth 2 (two) times daily as needed for headache (pain).   Yes [provider]  benzonatate (TESSALON) 100 MG capsule Take 100 mg by mouth 3 (three) times daily as needed for cough.   Yes [provider]  diltiazem (CARDIZEM) 60 MG tablet Take 60 mg by mouth every 6 (six) hours as needed (Rapid AFIB over 100).   Yes [provider]  fluticasone (FLOVENT HFA) 44 MCG/ACT inhaler Inhale 1 puff into the lungs 2 (two)  times daily.   Yes [provider]  levalbuterol (XOPENEX HFA) 45 MCG/ACT inhaler Inhale 1 puff into the lungs every 6 (six) hours as needed for shortness of breath. 11/21/17  Yes Carmin Muskrat, MD  losartan (COZAAR) 50 MG tablet TAKE 1 TABLET(50 MG) BY MOUTH DAILY Patient taking differently: Take 50 mg by mouth daily.  09/04/18  Yes [name], MD  magnesium hydroxide (MILK OF MAGNESIA) 400 MG/5ML suspension Take 15-30 mLs by mouth at bedtime as needed for  mild constipation. 11/01/17  Yes Harris, Abigail, PA-C  montelukast (SINGULAIR) 10 MG tablet Take 1 tablet (10 mg total) by mouth at bedtime. 05/16/18  Yes [name], MD  Multiple Vitamin (MULTIVITAMIN WITH MINERALS) TABS tablet Take 1 tablet by mouth every other day.   Yes [provider]  potassium chloride (K-DUR,KLOR-CON) 10 MEQ tablet Take 1 tablet (10 mEq total) by mouth 2 (two) times daily. 03/08/18  Yes Sherran Needs, NP  rivaroxaban (XARELTO) 20 MG TABS tablet Take 1 tablet (20 mg total) by mouth daily with supper. 08/31/18  Yes Sherran Needs, NP  tiotropium (SPIRIVA) 18 MCG inhalation capsule Place 1 capsule (18 mcg total) into inhaler and inhale daily. 05/26/18  Yes [name], MD  triamcinolone cream (KENALOG) 0.1 % Apply 1 application topically 2 (two) times daily. Patient taking differently: Apply 1 application topically 2 (two) times daily as needed (rash/itching).  05/06/18  Yes Henderly, Britni A, PA-C  diclofenac sodium (VOLTAREN) 1 % GEL Apply 2 g topically 4 (four) times daily. Patient not taking: Reported on 10/09/2018 06/13/18   Modena Nunnery D, DO  dicyclomine (BENTYL) 20 MG tablet Take 1 tablet (20 mg total) by mouth 2 (two) times daily. Patient not taking: Reported on 10/09/2018 01/25/18   Carlisle Cater, PA-C  diltiazem (CARTIA XT) 120 MG 24 hr capsule Take 1 capsule (120 mg total) by mouth 2 (two) times daily. TAKE 1 CAPSULE(120 MG) BY MOUTH EVERY EVENING 10/09/18   Sherran Needs, NP  levalbuterol Penne Lash) 1.25 MG/3ML nebulizer solution Take 1.25 mg by nebulization every 4 (four) hours as needed for wheezing. Patient not taking: Reported on 10/09/2018 09/23/18 09/23/19  Lennice Sites, DO  pantoprazole (PROTONIX) 40 MG tablet Take 1 tablet (40 mg total) by mouth daily. Patient not taking: Reported on 10/09/2018 10/05/18 10/05/19  Welford Roche, MD  polyethylene glycol powder (GLYCOLAX/MIRALAX) powder Mix 17g of Miralax in 8 ounces of water every other  day Patient not taking: Reported on 10/03/2018 07/20/17   [name], MD  simethicone (GAS-X) 80 MG chewable tablet Chew 1 tablet (80 mg total) by mouth every 6 (six) hours as needed for flatulence. Patient not taking: Reported on 10/04/2018 11/01/17   Margarita Mail, PA-C  sucralfate (CARAFATE) 1 GM/10ML suspension Take 10 mLs (1 g total) by mouth 4 (four) times daily. Patient not taking: Reported on 10/09/2018 10/06/18 10/06/19  Bartholomew Crews, MD  traMADol (ULTRAM) 50 MG tablet Take 1 tablet (50 mg total) by mouth every 6 (six) hours as needed. Patient not taking: Reported on 10/03/2018 09/25/18 09/25/19  [name], MD    Family History Family History  Problem Relation Age of Onset  . Heart attack Mother        [name] age  . Breast cancer Sister        Late 30s; now s/p mastectomy   . Lung cancer Brother        x 2  . Cancer Sister   . Cancer Sister   .  Cancer Sister   . Colon cancer Neg Hx   . Colon polyps Neg Hx   . Esophageal cancer Neg Hx   . Rectal cancer Neg Hx   . Stomach cancer Neg Hx     Social History Social History   Tobacco Use  . Smoking status: Former Smoker    Last attempt to quit: 10/24/2015    Years since quitting: 2.9  . Smokeless tobacco: Never Used  Substance Use Topics  . Alcohol use: No  . Drug use: No    Types: Marijuana    Comment: Smoked marijuana in the past.     Allergies   Albuterol; Celecoxib; and Protonix [pantoprazole sodium]   Review of Systems Review of Systems  Respiratory: Negative for shortness of breath.   Cardiovascular: Positive for chest pain and palpitations.  Gastrointestinal: Positive for abdominal pain. Negative for blood in stool and vomiting.  Genitourinary: Positive for vaginal bleeding. Negative for dysuria.  Neurological: Negative for dizziness.  All other systems reviewed and are negative.    Physical Exam Updated Vital Signs BP (!) 141/88   Pulse 83   Temp 98.3 F (36.8 C) (Oral)   Resp (!) 26    Ht 5\' 7"  (1.702 m)   Wt 69.9 kg   SpO2 99%   BMI 24.12 kg/m   Physical Exam Vitals signs and nursing note reviewed. Exam conducted with a chaperone present.  Constitutional:      General: She is not in acute distress.    Appearance: She is well-developed. She is not ill-appearing or diaphoretic.  HENT:     Head: Normocephalic and atraumatic.     Right Ear: External ear normal.     Left Ear: External ear normal.     Nose: Nose normal.  Eyes:     General:        Right eye: No discharge.        Left eye: No discharge.  Cardiovascular:     Rate and Rhythm: Normal rate and regular rhythm.     Heart sounds: Normal heart sounds.  Pulmonary:     Effort: Pulmonary effort is normal.     Breath sounds: Normal breath sounds.  Chest:     Chest wall: No tenderness.  Abdominal:     Palpations: Abdomen is soft.     Tenderness: There is abdominal tenderness in the suprapubic area.  Genitourinary:    Vagina: No bleeding.     Comments: No hemorrhoids, pain, mass or gross blood/melena on DRE No vaginal bleeding or obvious lesions Skin:    General: Skin is warm and dry.  Neurological:     Mental Status: She is alert.  Psychiatric:        Mood and Affect: Mood is not anxious.      ED Treatments / Results  Labs (all labs ordered are listed, but only abnormal results are displayed) Labs Reviewed  COMPREHENSIVE METABOLIC PANEL - Abnormal; Notable for the following components:      Result Value   Potassium 3.2 (*)    Glucose, Bld 107 (*)    Albumin 3.2 (*)    ALT 48 (*)    All other components within normal limits  CBC WITH DIFFERENTIAL/PLATELET - Abnormal; Notable for the following components:   Hemoglobin 11.6 (*)    RDW 15.9 (*)    All other components within normal limits  URINALYSIS, ROUTINE W REFLEX MICROSCOPIC - Abnormal; Notable for the following components:   APPearance CLOUDY (*)  Hgb urine dipstick SMALL (*)    Ketones, ur 5 (*)    Leukocytes,Ua TRACE (*)     Bacteria, UA RARE (*)    All other components within normal limits  CBC - Abnormal; Notable for the following components:   Hemoglobin 11.3 (*)    HCT 35.3 (*)    RDW 15.9 (*)    All other components within normal limits  POC OCCULT BLOOD, ED  I-STAT TROPONIN, ED  I-STAT TROPONIN, ED    EKG EKG Interpretation  Date/Time:  Monday October 09 2018 07:35:07 EST Ventricular Rate:  84 PR Interval:    QRS Duration: 87 QT Interval:  371 QTC Calculation: 439 R Axis:   49 Text Interpretation:  Sinus rhythm RSR' in V1 or V2, probably normal variant no acute ST/T changes no significant change since Oct 04 2018 Confirmed by Sherwood Gambler [phone]) on 10/09/2018 7:38:40 AM Also confirmed by Sherwood Gambler [phone]), editor Hattie Perch [phone])  on 10/09/2018 9:13:27 AM   Radiology Dg Chest 2 View  Result Date: 10/09/2018 CLINICAL DATA:  Chest pain. EXAM: CHEST - 2 VIEW COMPARISON:  Radiograph of October 03, 2018. FINDINGS: The heart size and mediastinal contours are within normal limits. Both lungs are clear. No pneumothorax or pleural effusion is noted. The visualized skeletal structures are unremarkable. IMPRESSION: No active cardiopulmonary disease. Electronically Signed   By: Marijo Conception, M.D.   On: 10/09/2018 08:20   US Transvaginal Non-ob  Result Date: 10/09/2018 CLINICAL DATA:  Postmenopausal bleeding 1 day. EXAM: TRANSABDOMINAL AND TRANSVAGINAL ULTRASOUND OF PELVIS TECHNIQUE: Both transabdominal and transvaginal ultrasound examinations of the pelvis were performed. Transabdominal technique was performed for global imaging of the pelvis including uterus, ovaries, adnexal regions, and pelvic cul-de-sac. It was necessary to proceed with endovaginal exam following the transabdominal exam to visualize the endometrium and ovaries. COMPARISON:  None FINDINGS: Uterus Measurements: 3.5 x 4.5 x 4.4 cm = volume: 37 mL. No fibroids or other mass visualized. Endometrium Thickness: 3.4 mm.  No  focal abnormality visualized. Right ovary Measurements: 1.0 x 2.4 x 2.3 cm = volume: 2.8 mL. Normal appearance/no adnexal mass. Normal color flow. Left ovary Measurements: 2.0 x 0.9 x 1.7 cm = volume: 1.6 mL. Normal appearance/no adnexal mass. Normal color flow. Other findings No abnormal free fluid. IMPRESSION: Normal pelvic ultrasound.  No focal endometrial abnormality. Electronically Signed   By: Marin Olp M.D.   On: 10/09/2018 10:45   US Pelvis Complete  Result Date: 10/09/2018 CLINICAL DATA:  Postmenopausal bleeding 1 day. EXAM: TRANSABDOMINAL AND TRANSVAGINAL ULTRASOUND OF PELVIS TECHNIQUE: Both transabdominal and transvaginal ultrasound examinations of the pelvis were performed. Transabdominal technique was performed for global imaging of the pelvis including uterus, ovaries, adnexal regions, and pelvic cul-de-sac. It was necessary to proceed with endovaginal exam following the transabdominal exam to visualize the endometrium and ovaries. COMPARISON:  None FINDINGS: Uterus Measurements: 3.5 x 4.5 x 4.4 cm = volume: 37 mL. No fibroids or other mass visualized. Endometrium Thickness: 3.4 mm.  No focal abnormality visualized. Right ovary Measurements: 1.0 x 2.4 x 2.3 cm = volume: 2.8 mL. Normal appearance/no adnexal mass. Normal color flow. Left ovary Measurements: 2.0 x 0.9 x 1.7 cm = volume: 1.6 mL. Normal appearance/no adnexal mass. Normal color flow. Other findings No abnormal free fluid. IMPRESSION: Normal pelvic ultrasound.  No focal endometrial abnormality. Electronically Signed   By: Marin Olp M.D.   On: 10/09/2018 10:45    Procedures Procedures (including critical care time)  Medications  Ordered in ED Medications  potassium chloride SA (K-DUR,KLOR-CON) CR tablet 40 mEq (40 mEq Oral Given 10/09/18 1024)     Initial Impression / Assessment and Plan / ED Course  I have reviewed the triage vital signs and the nursing notes.  Pertinent labs & imaging results that were available  during my care of the patient were reviewed by me and considered in my medical decision making (see chart for details).     Patient's palpitations are of unclear cause but currently she is not having them and is in normal sinus rhythm.  I discussed that she cannot take diltiazem that many times every time she has palpitations.  Potassium was repleted.  She is noted to be mildly anemic compared to most recent but this is around her baseline.  Second troponin was checked as well as a second CBC and these are stable, hemoglobin has gone from 11.6-11.3 but this is unlikely to represent significant bleeding and she currently has no complaints of bleeding on exam.  She is adamant it is not coming from her rectum.  Ultrasound does not show an obvious concerning findings such as thickened endometrium.  Chest pain is very atypical and I think it unlikely to be ACS, PE or dissection.  She appears stable for outpatient follow-up with her PCP  Final Clinical Impressions(s) / ED Diagnoses   Final diagnoses:  Palpitations  Vaginal bleeding    ED Discharge Orders    None       Sherwood Gambler, MD 10/09/18 1554

## 2018-10-09 NOTE — ED Triage Notes (Signed)
During EDP assessment Pt reported the bleeding was really from vagina not rectum.

## 2018-10-09 NOTE — Patient Instructions (Signed)
Mail in monitor on Monday, February 24th  Increase diltiazem 120mg  to twice a day

## 2018-10-10 ENCOUNTER — Other Ambulatory Visit: Payer: Self-pay

## 2018-10-10 ENCOUNTER — Ambulatory Visit (INDEPENDENT_AMBULATORY_CARE_PROVIDER_SITE_OTHER): Payer: Medicare Other | Admitting: Internal Medicine

## 2018-10-10 ENCOUNTER — Other Ambulatory Visit (HOSPITAL_COMMUNITY)
Admission: RE | Admit: 2018-10-10 | Discharge: 2018-10-10 | Disposition: A | Discharge status: Home / Self Care | Payer: Medicare Other | Source: Ambulatory Visit | Attending: Student in an Organized Health Care Education/Training Program | Admitting: Student in an Organized Health Care Education/Training Program

## 2018-10-10 VITALS — BP 122/80 | HR 89 | Temp 98.4°F | Ht 68.0 in | Wt 150.0 lb

## 2018-10-10 DIAGNOSIS — I480 Paroxysmal atrial fibrillation: Secondary | ICD-10-CM | POA: Diagnosis not present

## 2018-10-10 DIAGNOSIS — Z79899 Other long term (current) drug therapy: Secondary | ICD-10-CM

## 2018-10-10 DIAGNOSIS — K219 Gastro-esophageal reflux disease without esophagitis: Secondary | ICD-10-CM

## 2018-10-10 DIAGNOSIS — R002 Palpitations: Secondary | ICD-10-CM

## 2018-10-10 DIAGNOSIS — R079 Chest pain, unspecified: Secondary | ICD-10-CM | POA: Diagnosis not present

## 2018-10-10 DIAGNOSIS — I10 Essential (primary) hypertension: Secondary | ICD-10-CM

## 2018-10-10 DIAGNOSIS — N950 Postmenopausal bleeding: Secondary | ICD-10-CM | POA: Diagnosis not present

## 2018-10-10 DIAGNOSIS — N898 Other specified noninflammatory disorders of vagina: Secondary | ICD-10-CM

## 2018-10-10 DIAGNOSIS — N939 Abnormal uterine and vaginal bleeding, unspecified: Secondary | ICD-10-CM

## 2018-10-10 NOTE — Patient Instructions (Signed)
I will call you with the results of your lab work. 

## 2018-10-10 NOTE — Progress Notes (Signed)
CC: palpitations and vaginal bleeding  HPI:  [name] is a 68 y.o. female with paroxysmal atrial fibrillation, hypertension, GERD who presents for ED follow-up for palpitations and vaginal bleeding.  She was seen in the ED yesterday for a 2-day history of palpitations and chest pain.  At that time she had taken 5 different doses of diltiazem (60 mg q6h) as needed for the palpitations. Her labs were unremarkable, EKG showed normal sinus rhythm, and ACS/PE/dissection was low on the differential. She was discharged and was able to see her cardiologist the same day. They recommended increasing her diltiazem 120 mg to BID and placed a Zio patch. She has not had any new episodes of palpitations since.   She also reports vaginal bleeding which began 2 days ago and resolved prior to arriving to the ED. She reports bleeding through her underwear several times but did not use pads/tampons. She denies rectal bleeding. Speculum exam did not show any active signs of bleeding and she has not bled today. She does report foul-smelling vaginal discharge over the last week. She has not had sex in over 6 months and only has one partner. They do not use condoms.   Past Medical History:  Diagnosis Date  . A-fib ([place])   . Allergy    seasonal  . Asthma   . Chronic anticoagulation   . Eczema   . GERD (gastroesophageal reflux disease)   . Hypertension   . PAF (paroxysmal atrial fibrillation) ([place]) 11/26/2015   April/May 2017: Multiple ED visits for A. Fib.  02/12/16: Improved on diltiazem IV. Cardiology consulted in the ED and discharge patient with diltiazem XR 120 mg once daily with 60 mg by mouth every 6 hours as needed for symptomatic palpitations, flecainide 50 mg twice daily. Referred to the atrial fibrillation clinic.  02/17/16: Seen in the atrial fibrillation clinic by NP Roderic Palau. Normal sin   Review of Systems:   Review of Systems  Constitutional: Negative for chills and fever.    Cardiovascular: Negative for chest pain, palpitations and leg swelling.  Gastrointestinal: Negative for abdominal pain, constipation, diarrhea, melena and vomiting.  Genitourinary: Negative for dysuria, frequency, hematuria and urgency.       Positive for vaginal bleeding and discharge.   All other systems reviewed and are negative.   Physical Exam:  Vitals:   10/10/18 1530  BP: 122/80  Pulse: 89  Temp: 98.4 F (36.9 C)  TempSrc: Oral  SpO2: 100%  Weight: 150 lb (68 kg)  Height: 5\' 8"  (1.727 m)   Physical Exam Vitals signs and nursing note reviewed. Exam conducted with a chaperone present.  Constitutional:      Appearance: Normal appearance.  Cardiovascular:     Rate and Rhythm: Normal rate and regular rhythm.     Pulses: Normal pulses.     Heart sounds: Normal heart sounds.  Pulmonary:     Effort: Pulmonary effort is normal.     Breath sounds: Normal breath sounds.  Abdominal:     General: Abdomen is flat. There is no distension.     Palpations: Abdomen is soft.  Genitourinary:    General: Normal vulva.     Exam position: Lithotomy position.     Pubic Area: No rash.      Vagina: Normal.     Cervix: Discharge (Small amount of non-malodorous cream colored discharged at the posterior vaginal vault and cervix.) present.  Skin:    General: Skin is warm and dry.  Neurological:  Mental Status: She is alert and oriented to person, place, and time. Mental status is at baseline.  Psychiatric:        Mood and Affect: Mood normal.        Behavior: Behavior normal.     Assessment & Plan:   See Encounters Tab for problem based charting.  Patient discussed with Dr. Evette Doffing

## 2018-10-11 ENCOUNTER — Other Ambulatory Visit: Payer: Self-pay | Admitting: Internal Medicine

## 2018-10-11 ENCOUNTER — Encounter: Payer: Self-pay | Admitting: *Deleted

## 2018-10-11 DIAGNOSIS — N939 Abnormal uterine and vaginal bleeding, unspecified: Secondary | ICD-10-CM

## 2018-10-11 DIAGNOSIS — N898 Other specified noninflammatory disorders of vagina: Secondary | ICD-10-CM | POA: Insufficient documentation

## 2018-10-11 HISTORY — DX: Abnormal uterine and vaginal bleeding, unspecified: N93.9

## 2018-10-11 LAB — CERVICOVAGINAL ANCILLARY ONLY
Bacterial vaginitis: POSITIVE — AB
Candida vaginitis: POSITIVE — AB
Chlamydia: NEGATIVE
Neisseria Gonorrhea: NEGATIVE
TRICH (WINDOWPATH): NEGATIVE

## 2018-10-11 MED ORDER — METRONIDAZOLE 500 MG PO TABS: 500.0000 mg | ORAL_TABLET | Freq: Two times a day (BID) | ORAL | 0 refills | Status: DC

## 2018-10-11 MED ORDER — FLUCONAZOLE 150 MG PO TABS: 150.0000 mg | ORAL_TABLET | Freq: Every day | ORAL | 0 refills | Status: DC

## 2018-10-11 NOTE — Assessment & Plan Note (Signed)
Assessment/Plan: [name] presents with vaginal bleeding which has now resolved. Pelvic US revealed an endometrial thickness of 3.4 mm (thickness less than or equal to 4 is associated with a low risk of endometrial disease). Additionally there was no signs of uterine fibroids. Will plan on monitoring for now.

## 2018-10-11 NOTE — Progress Notes (Signed)
Wet prep positive for both BV and candida. I will send in a prescription for both fluconazole and metronidazole to treat for both microbes as they can both cause abnormal vaginal discharge.

## 2018-10-11 NOTE — Assessment & Plan Note (Signed)
Assessment: Palpitations resolved. Zio patch in place. Cardiology will follow-up. Will continue to monitor for symptoms.  Plan: 1. Continue diltiazem 120 mg BID per cards

## 2018-10-11 NOTE — Progress Notes (Signed)
Letter updated with correct contact number for  Pulmonary

## 2018-10-11 NOTE — Assessment & Plan Note (Signed)
Assessment: [name] presents with abnormal vaginal discharge for the past week. I performed a wet prep today which includes testing for BV, yeast, and less likey organisms GC and chlamydia.  Plan: 1. Follow up BV, yeast, GC, and chlamydia testing

## 2018-10-11 NOTE — Progress Notes (Signed)
Internal Medicine Clinic Attending  Case discussed with Dr. Alfonse Spruce  at the time of the visit.  We reviewed the resident's history and exam and pertinent patient test results.  I agree with the assessment, diagnosis, and plan of care documented in the resident's note.  68 year old person here with postmenopausal bleeding. She has normal appearing endometrium on transvaginal ultrasound, making this low risk for endometrial cancer. Will rule out acute vaginal infections with wet prep today. If bleeding persists will refer to gynecology for endometrial biopsy.

## 2018-10-13 ENCOUNTER — Encounter (HOSPITAL_COMMUNITY): Payer: Self-pay | Admitting: *Deleted

## 2018-10-13 ENCOUNTER — Emergency Department (HOSPITAL_COMMUNITY)
Admission: EM | Admit: 2018-10-13 | Discharge: 2018-10-14 | Disposition: A | Discharge status: Home / Self Care | Payer: Medicare Other | Attending: Emergency Medicine | Admitting: Emergency Medicine

## 2018-10-13 ENCOUNTER — Other Ambulatory Visit: Payer: Self-pay

## 2018-10-13 DIAGNOSIS — R109 Unspecified abdominal pain: Secondary | ICD-10-CM | POA: Diagnosis not present

## 2018-10-13 DIAGNOSIS — I10 Essential (primary) hypertension: Secondary | ICD-10-CM | POA: Diagnosis not present

## 2018-10-13 DIAGNOSIS — Z87891 Personal history of nicotine dependence: Secondary | ICD-10-CM | POA: Insufficient documentation

## 2018-10-13 DIAGNOSIS — R1110 Vomiting, unspecified: Secondary | ICD-10-CM | POA: Insufficient documentation

## 2018-10-13 DIAGNOSIS — J45909 Unspecified asthma, uncomplicated: Secondary | ICD-10-CM | POA: Insufficient documentation

## 2018-10-13 DIAGNOSIS — Z7901 Long term (current) use of anticoagulants: Secondary | ICD-10-CM | POA: Insufficient documentation

## 2018-10-13 DIAGNOSIS — Z79899 Other long term (current) drug therapy: Secondary | ICD-10-CM | POA: Insufficient documentation

## 2018-10-13 DIAGNOSIS — N200 Calculus of kidney: Secondary | ICD-10-CM | POA: Diagnosis not present

## 2018-10-13 LAB — COMPREHENSIVE METABOLIC PANEL
ALT: 36 U/L (ref 0–44)
AST: 28 U/L (ref 15–41)
Albumin: 3.6 g/dL (ref 3.5–5.0)
Alkaline Phosphatase: 81 U/L (ref 38–126)
Anion gap: 9 (ref 5–15)
BUN: 10 mg/dL (ref 8–23)
CO2: 23 mmol/L (ref 22–32)
Calcium: 9.6 mg/dL (ref 8.9–10.3)
Chloride: 107 mmol/L (ref 98–111)
Creatinine, Ser: 0.94 mg/dL (ref 0.44–1.00)
GFR calc non Af Amer: >60 mL/min (ref >60)
Glucose, Bld: 127 mg/dL — ABNORMAL HIGH (ref 70–99)
Potassium: 3.6 mmol/L (ref 3.5–5.1)
Sodium: 139 mmol/L (ref 135–145)
Total Bilirubin: 0.6 mg/dL (ref 0.3–1.2)
Total Protein: 7.1 g/dL (ref 6.5–8.1)

## 2018-10-13 LAB — CBC
HCT: 40.1 % (ref 36.0–46.0)
Hemoglobin: 12.4 g/dL (ref 12.0–15.0)
MCH: 25.8 pg — ABNORMAL LOW (ref 26.0–34.0)
MCHC: 30.9 g/dL (ref 30.0–36.0)
MCV: 83.4 fL (ref 80.0–100.0)
Platelets: 364 10*3/uL (ref 150–400)
RBC: 4.81 MIL/uL (ref 3.87–5.11)
RDW: 15.8 % — ABNORMAL HIGH (ref 11.5–15.5)
WBC: 4.1 10*3/uL (ref 4.0–10.5)
nRBC: 0 % (ref 0.0–0.2)

## 2018-10-13 LAB — LIPASE, BLOOD: Lipase: 43 U/L (ref 11–51)

## 2018-10-13 MED ORDER — ONDANSETRON 4 MG PO TBDP
4.0000 mg | ORAL_TABLET | Freq: Once | ORAL | Status: AC | PRN
  Administered 2018-10-13: 4 mg via ORAL
  Filled 2018-10-13: qty 1

## 2018-10-13 MED ORDER — OXYCODONE-ACETAMINOPHEN 5-325 MG PO TABS
1.0000 | ORAL_TABLET | ORAL | Status: DC | PRN
  Administered 2018-10-13: 1 via ORAL
  Filled 2018-10-13: qty 1

## 2018-10-13 MED ORDER — SODIUM CHLORIDE 0.9% FLUSH: 3.0000 mL | Freq: Once | INTRAVENOUS | Status: DC

## 2018-10-13 NOTE — ED Triage Notes (Signed)
Pt attempted to lie down for bed, started having L flank and L upper abd pain. Reports NV

## 2018-10-14 ENCOUNTER — Emergency Department (HOSPITAL_COMMUNITY): Payer: Medicare Other

## 2018-10-14 DIAGNOSIS — N200 Calculus of kidney: Secondary | ICD-10-CM | POA: Diagnosis not present

## 2018-10-14 DIAGNOSIS — R109 Unspecified abdominal pain: Secondary | ICD-10-CM | POA: Diagnosis not present

## 2018-10-14 LAB — URINALYSIS, ROUTINE W REFLEX MICROSCOPIC
Bacteria, UA: NONE SEEN
Bilirubin Urine: NEGATIVE
Glucose, UA: NEGATIVE mg/dL
Ketones, ur: NEGATIVE mg/dL
Nitrite: NEGATIVE
Protein, ur: NEGATIVE mg/dL
Specific Gravity, Urine: 1.016 (ref 1.005–1.030)
pH: 5 (ref 5.0–8.0)

## 2018-10-14 MED ORDER — OXYCODONE-ACETAMINOPHEN 5-325 MG PO TABS
1.0000 | ORAL_TABLET | Freq: Once | ORAL | Status: AC
  Administered 2018-10-14: 1 via ORAL
  Filled 2018-10-14: qty 1

## 2018-10-14 NOTE — ED Provider Notes (Signed)
[facility] EMERGENCY DEPARTMENT Provider Note   CSN: [id number] Arrival date & time: 10/13/18  2234    History   Chief Complaint Chief Complaint  Patient presents with  . Flank Pain    HPI [name] is a 68 y.o. female.     The history is provided by the patient.  Flank Pain  This is a new problem. The problem occurs constantly. The problem has not changed since onset.Pertinent negatives include no chest pain. Nothing aggravates the symptoms. Nothing relieves the symptoms. She has tried nothing for the symptoms.    Past Medical History:  Diagnosis Date  . A-fib ([place])   . Allergy    seasonal  . Asthma   . Chronic anticoagulation   . Eczema   . GERD (gastroesophageal reflux disease)   . Hypertension   . PAF (paroxysmal atrial fibrillation) ([place]) 11/26/2015   April/May 2017: Multiple ED visits for A. Fib.  02/12/16: Improved on diltiazem IV. Cardiology consulted in the ED and discharge patient with diltiazem XR 120 mg once daily with 60 mg by mouth every 6 hours as needed for symptomatic palpitations, flecainide 50 mg twice daily. Referred to the atrial fibrillation clinic.  02/17/16: Seen in the atrial fibrillation clinic by NP Roderic Palau. Normal sin    Patient Active Problem List   Diagnosis Date Noted  . Vaginal discharge 10/11/2018  . Vaginal bleeding 10/11/2018  . History of radiofrequency ablation (RFA) procedure for cardiac arrhythmia   . Asthma exacerbation 08/28/2018  . Chronic congestion of paranasal sinus 06/14/2018  . Primary osteoarthritis involving multiple joints 06/14/2018  . Acute pain of left knee 05/15/2018  . Hyperpigmented skin lesion 09/09/2017  . Osteoporosis without current pathological fracture 04/15/2017  . Normocytic anemia 10/19/2016  . Hyperlipidemia 03/18/2016  . Special screening for malignant neoplasms, colon 03/17/2016  . Cervical cancer screening 03/17/2016  . Assistance with transportation 03/17/2016  . HTN  (hypertension), benign   . GERD (gastroesophageal reflux disease)   . PAF (paroxysmal atrial fibrillation) ([place]) 11/26/2015  . Chronic anticoagulation 11/26/2015  . Marijuana abuse 06/11/2015    Past Surgical History:  Procedure Laterality Date  . ATRIAL FIBRILLATION ABLATION N/A 09/30/2017   Procedure: ATRIAL FIBRILLATION ABLATION;  Surgeon: Thompson Grayer, MD;  Location: [place] [facility];  Service: Cardiovascular;  Laterality: N/A;  . TONSILLECTOMY    . TUBAL LIGATION       OB History   No obstetric history on file.      Home Medications    Prior to Admission medications   Medication Sig Start Date End Date Taking? Authorizing Provider  acetaminophen (TYLENOL) 500 MG tablet Take 1,000 mg by mouth 2 (two) times daily as needed for headache (pain).   Yes [provider]  benzonatate (TESSALON) 100 MG capsule Take 100 mg by mouth 3 (three) times daily as needed for cough.   Yes [provider]  diltiazem (CARDIZEM) 60 MG tablet Take 60 mg by mouth every 6 (six) hours as needed (Rapid AFIB over 100).   Yes [provider]  diltiazem (CARTIA XT) 120 MG 24 hr capsule Take 1 capsule (120 mg total) by mouth 2 (two) times daily. TAKE 1 CAPSULE(120 MG) BY MOUTH EVERY EVENING 10/09/18  Yes Sherran Needs, NP  fluconazole (DIFLUCAN) 150 MG tablet Take 1 tablet (150 mg total) by mouth daily. 10/11/18  Yes Carroll Sage, MD  fluticasone (FLOVENT HFA) 44 MCG/ACT inhaler Inhale 1 puff into the lungs 2 (  two) times daily.   Yes [provider]  levalbuterol (XOPENEX HFA) 45 MCG/ACT inhaler Inhale 1 puff into the lungs every 6 (six) hours as needed for shortness of breath. 11/21/17  Yes Carmin Muskrat, MD  losartan (COZAAR) 50 MG tablet TAKE 1 TABLET(50 MG) BY MOUTH DAILY Patient taking differently: Take 50 mg by mouth daily.  09/04/18  Yes [name], MD  magnesium hydroxide (MILK OF MAGNESIA) 400 MG/5ML suspension Take 15-30 mLs by mouth at bedtime as needed  for mild constipation. 11/01/17  Yes Harris, Abigail, PA-C  metroNIDAZOLE (FLAGYL) 500 MG tablet Take 1 tablet (500 mg total) by mouth 2 (two) times daily. 10/11/18  Yes Carroll Sage, MD  montelukast (SINGULAIR) 10 MG tablet Take 1 tablet (10 mg total) by mouth at bedtime. 05/16/18  Yes [name], MD  Multiple Vitamin (MULTIVITAMIN WITH MINERALS) TABS tablet Take 1 tablet by mouth every other day.   Yes [provider]  potassium chloride (K-DUR,KLOR-CON) 10 MEQ tablet Take 1 tablet (10 mEq total) by mouth 2 (two) times daily. 03/08/18  Yes Sherran Needs, NP  rivaroxaban (XARELTO) 20 MG TABS tablet Take 1 tablet (20 mg total) by mouth daily with supper. 08/31/18  Yes Sherran Needs, NP  sucralfate (CARAFATE) 1 GM/10ML suspension Take 10 mLs (1 g total) by mouth 4 (four) times daily. 10/06/18 10/06/19 Yes Bartholomew Crews, MD  tiotropium (SPIRIVA) 18 MCG inhalation capsule Place 1 capsule (18 mcg total) into inhaler and inhale daily. 05/26/18  Yes [name], MD  triamcinolone cream (KENALOG) 0.1 % Apply 1 application topically 2 (two) times daily. Patient taking differently: Apply 1 application topically 2 (two) times daily as needed (rash/itching).  05/06/18  Yes Henderly, Britni A, PA-C    Family History Family History  Problem Relation Age of Onset  . Heart attack Mother        [name] age  . Breast cancer Sister        Late 57s; now s/p mastectomy   . Lung cancer Brother        x 2  . Cancer Sister   . Cancer Sister   . Cancer Sister   . Colon cancer Neg Hx   . Colon polyps Neg Hx   . Esophageal cancer Neg Hx   . Rectal cancer Neg Hx   . Stomach cancer Neg Hx     Social History Social History   Tobacco Use  . Smoking status: Former Smoker    Last attempt to quit: 10/24/2015    Years since quitting: 2.9  . Smokeless tobacco: Never Used  Substance Use Topics  . Alcohol use: No  . Drug use: No    Types: Marijuana    Comment: Smoked marijuana in the past.      Allergies   Albuterol; Celecoxib; and Protonix [pantoprazole sodium]   Review of Systems Review of Systems  Cardiovascular: Negative for chest pain.  Genitourinary: Positive for flank pain.  All other systems reviewed and are negative.    Physical Exam Updated Vital Signs BP 116/71 (BP Location: Right Arm)   Pulse 81   Temp 98 F (36.7 C) (Oral)   Resp 18   SpO2 99%   Physical Exam Vitals signs and nursing note reviewed.  Constitutional:      Appearance: She is well-developed.  HENT:     Head: Normocephalic and atraumatic.     Nose: Nose normal. No congestion or rhinorrhea.     Mouth/Throat:  Mouth: Mucous membranes are moist.     Pharynx: Oropharynx is clear.  Eyes:     Extraocular Movements: Extraocular movements intact.     Conjunctiva/sclera: Conjunctivae normal.  Neck:     Musculoskeletal: Normal range of motion.  Cardiovascular:     Rate and Rhythm: Normal rate and regular rhythm.  Pulmonary:     Effort: No respiratory distress.     Breath sounds: No stridor.  Abdominal:     General: Abdomen is flat. There is no distension.  Musculoskeletal: Normal range of motion.        General: No swelling or tenderness.  Skin:    General: Skin is warm and dry.  Neurological:     General: No focal deficit present.     Mental Status: She is alert.      ED Treatments / Results  Labs (all labs ordered are listed, but only abnormal results are displayed) Labs Reviewed  COMPREHENSIVE METABOLIC PANEL - Abnormal; Notable for the following components:      Result Value   Glucose, Bld 127 (*)    All other components within normal limits  CBC - Abnormal; Notable for the following components:   MCH 25.8 (*)    RDW 15.8 (*)    All other components within normal limits  URINALYSIS, ROUTINE W REFLEX MICROSCOPIC - Abnormal; Notable for the following components:   Hgb urine dipstick SMALL (*)    Leukocytes,Ua TRACE (*)    All other components within normal  limits  LIPASE, BLOOD    EKG None  Radiology Ct Renal Stone Study  Result Date: 10/14/2018 CLINICAL DATA:  68 y/o F; right flank pain with 1 episode of vomiting. EXAM: CT ABDOMEN AND PELVIS WITHOUT CONTRAST TECHNIQUE: Multidetector CT imaging of the abdomen and pelvis was performed following the standard protocol without IV contrast. COMPARISON:  02/14/2018 CT abdomen and pelvis. FINDINGS: Lower chest: No acute abnormality. Hepatobiliary: Stable liver cysts. Otherwise no focal liver abnormality is seen. No gallstones, gallbladder wall thickening, or biliary dilatation. Pancreas: Unremarkable. No pancreatic ductal dilatation or surrounding inflammatory changes. Spleen: Normal in size without focal abnormality. Adrenals/Urinary Tract: Adrenal glands are unremarkable. Punctate nonobstructing stone in the lower pole of right kidney (series 6, image 53). Otherwise kidneys are normal, without additional renal calculi, focal lesion, or hydronephrosis. Bladder is unremarkable. Stomach/Bowel: Stomach is within normal limits. Appendix appears normal. No evidence of bowel wall thickening, distention, or inflammatory changes. Vascular/Lymphatic: Aortic atherosclerosis. No enlarged abdominal or pelvic lymph nodes. Reproductive: Uterus and bilateral adnexa are unremarkable. Other: No abdominal wall hernia or abnormality. No abdominopelvic ascites. Musculoskeletal: No fracture is seen. Disc and facet degenerative changes of the spine. No high-grade spinal canal stenosis. Disc bulges encroach on the neural foramen bilaterally at the L3-L5 levels. IMPRESSION: 1. Punctate nonobstructing stone in the lower pole of right kidney. No ureter stone or hydronephrosis identified. 2. Aortic Atherosclerosis (ICD10-I70.0). Electronically Signed   By: Kristine Garbe M.D.   On: 10/14/2018 04:57    Procedures Procedures (including critical care time)  Medications Ordered in ED Medications  sodium chloride flush (NS) 0.9  % injection 3 mL (has no administration in time range)  ondansetron (ZOFRAN-ODT) disintegrating tablet 4 mg (4 mg Oral Given 10/13/18 2250)  oxyCODONE-acetaminophen (PERCOCET/ROXICET) 5-325 MG per tablet 1 tablet (1 tablet Oral Given 10/14/18 0530)     Initial Impression / Assessment and Plan / ED Course  I have reviewed the triage vital signs and the nursing notes.  Pertinent labs &  imaging results that were available during my care of the patient were reviewed by me and considered in my medical decision making (see chart for details).        Msk? No rash to suggest zoster. No abnormalities on CT. Doubt other infectious causes. Pain controlled in ED. Will dc to fu w/ pcp.  Final Clinical Impressions(s) / ED Diagnoses   Final diagnoses:  Flank pain    ED Discharge Orders    None       Jalyssa Fleisher, Corene Cornea, MD 10/14/18 [phone]

## 2018-10-14 NOTE — ED Notes (Signed)
Pt transported to CT ?

## 2018-10-14 NOTE — ED Notes (Signed)
Pt remains in waiting room. Updated on wait for treatment room.  Pt states she is feeling a little better.

## 2018-10-14 NOTE — ED Notes (Signed)
Patient verbalizes understanding of discharge instructions. Opportunity for questioning and answers were provided. Armband removed by staff, pt discharged from ED by wheelchair   

## 2018-10-15 ENCOUNTER — Emergency Department (HOSPITAL_COMMUNITY)
Admission: EM | Admit: 2018-10-15 | Discharge: 2018-10-15 | Disposition: A | Discharge status: Home / Self Care | Payer: Medicare Other | Attending: Emergency Medicine | Admitting: Emergency Medicine

## 2018-10-15 ENCOUNTER — Other Ambulatory Visit: Payer: Self-pay

## 2018-10-15 ENCOUNTER — Encounter (HOSPITAL_COMMUNITY): Payer: Self-pay

## 2018-10-15 ENCOUNTER — Emergency Department (HOSPITAL_COMMUNITY): Payer: Medicare Other

## 2018-10-15 DIAGNOSIS — Z87891 Personal history of nicotine dependence: Secondary | ICD-10-CM | POA: Diagnosis not present

## 2018-10-15 DIAGNOSIS — J45909 Unspecified asthma, uncomplicated: Secondary | ICD-10-CM | POA: Diagnosis not present

## 2018-10-15 DIAGNOSIS — I4891 Unspecified atrial fibrillation: Secondary | ICD-10-CM | POA: Diagnosis not present

## 2018-10-15 DIAGNOSIS — I10 Essential (primary) hypertension: Secondary | ICD-10-CM | POA: Insufficient documentation

## 2018-10-15 DIAGNOSIS — R9431 Abnormal electrocardiogram [ECG] [EKG]: Secondary | ICD-10-CM | POA: Diagnosis not present

## 2018-10-15 DIAGNOSIS — R079 Chest pain, unspecified: Secondary | ICD-10-CM | POA: Diagnosis not present

## 2018-10-15 DIAGNOSIS — Z79899 Other long term (current) drug therapy: Secondary | ICD-10-CM | POA: Insufficient documentation

## 2018-10-15 DIAGNOSIS — Z7901 Long term (current) use of anticoagulants: Secondary | ICD-10-CM | POA: Insufficient documentation

## 2018-10-15 DIAGNOSIS — R002 Palpitations: Secondary | ICD-10-CM | POA: Insufficient documentation

## 2018-10-15 DIAGNOSIS — R0789 Other chest pain: Secondary | ICD-10-CM | POA: Insufficient documentation

## 2018-10-15 LAB — BASIC METABOLIC PANEL
Anion gap: 10 (ref 5–15)
BUN: 10 mg/dL (ref 8–23)
CO2: 24 mmol/L (ref 22–32)
Calcium: 9.5 mg/dL (ref 8.9–10.3)
Chloride: 105 mmol/L (ref 98–111)
Creatinine, Ser: 0.77 mg/dL (ref 0.44–1.00)
GFR calc Af Amer: >60 mL/min (ref >60)
GFR calc non Af Amer: >60 mL/min (ref >60)
Glucose, Bld: 109 mg/dL — ABNORMAL HIGH (ref 70–99)
Potassium: 3.6 mmol/L (ref 3.5–5.1)
Sodium: 139 mmol/L (ref 135–145)

## 2018-10-15 LAB — CBC
HCT: 38.4 % (ref 36.0–46.0)
HEMOGLOBIN: 12 g/dL (ref 12.0–15.0)
MCH: 26.1 pg (ref 26.0–34.0)
MCHC: 31.3 g/dL (ref 30.0–36.0)
MCV: 83.7 fL (ref 80.0–100.0)
Platelets: 333 10*3/uL (ref 150–400)
RBC: 4.59 MIL/uL (ref 3.87–5.11)
RDW: 15.9 % — ABNORMAL HIGH (ref 11.5–15.5)
WBC: 4.2 10*3/uL (ref 4.0–10.5)
nRBC: 0 % (ref 0.0–0.2)

## 2018-10-15 LAB — I-STAT TROPONIN, ED: Troponin i, poc: 0 ng/mL (ref 0.00–0.08)

## 2018-10-15 NOTE — ED Provider Notes (Signed)
[facility] EMERGENCY DEPARTMENT Provider Note   CSN: [id number] Arrival date & time: 10/15/18  2376    History   Chief Complaint No chief complaint on file.   HPI NEENAH CANTER is a 68 y.o. female.     The history is provided by the patient and medical records. No language interpreter was used.  Chest Pain  Associated symptoms: palpitations   Palpitations  Associated symptoms: chest pain      68 year old female with history of paroxysmal atrial fibrillation currently on Xarelto, hypertension, GERD, asthma presenting for evaluation of chest pain.  Patient report at 5 AM this morning she was awoke with sensation of heart racing.  States her heart was fluttering and she was experiencing some sternal chest discomfort.  She also endorsed mild headache.  Symptoms persist despite taking diltiazem.  She waits for a while but without any relief of heart fluttering, patient decided to come to the ED.  Since being in the ED for the past 30 minutes her symptom has since subsided.  She is back to baseline.  She denies any fever or chills, no dizziness or lightheadedness, no shortness of breath back pain or abdominal pain.  She did mention having similar heart palpitation several times within the past 2 weeks.  Her doctor is aware and she is currently wearing a heart monitor.  She also report her diltiazem has been increased from 60mg  to 120 mg that she takes twice daily.  She has not missed any dosage.  She denies any caffeine use or other stimulant use.  Past Medical History:  Diagnosis Date  . A-fib ([place])   . Allergy    seasonal  . Asthma   . Chronic anticoagulation   . Eczema   . GERD (gastroesophageal reflux disease)   . Hypertension   . PAF (paroxysmal atrial fibrillation) ([place]) 11/26/2015   April/May 2017: Multiple ED visits for A. Fib.  02/12/16: Improved on diltiazem IV. Cardiology consulted in the ED and discharge patient with diltiazem XR 120 mg once daily with 60  mg by mouth every 6 hours as needed for symptomatic palpitations, flecainide 50 mg twice daily. Referred to the atrial fibrillation clinic.  02/17/16: Seen in the atrial fibrillation clinic by NP Roderic Palau. Normal sin    Patient Active Problem List   Diagnosis Date Noted  . Vaginal discharge 10/11/2018  . Vaginal bleeding 10/11/2018  . History of radiofrequency ablation (RFA) procedure for cardiac arrhythmia   . Asthma exacerbation 08/28/2018  . Chronic congestion of paranasal sinus 06/14/2018  . Primary osteoarthritis involving multiple joints 06/14/2018  . Acute pain of left knee 05/15/2018  . Hyperpigmented skin lesion 09/09/2017  . Osteoporosis without current pathological fracture 04/15/2017  . Normocytic anemia 10/19/2016  . Hyperlipidemia 03/18/2016  . Special screening for malignant neoplasms, colon 03/17/2016  . Cervical cancer screening 03/17/2016  . Assistance with transportation 03/17/2016  . HTN (hypertension), benign   . GERD (gastroesophageal reflux disease)   . PAF (paroxysmal atrial fibrillation) ([place]) 11/26/2015  . Chronic anticoagulation 11/26/2015  . Marijuana abuse 06/11/2015    Past Surgical History:  Procedure Laterality Date  . ATRIAL FIBRILLATION ABLATION N/A 09/30/2017   Procedure: ATRIAL FIBRILLATION ABLATION;  Surgeon: Thompson Grayer, MD;  Location: [place] [facility];  Service: Cardiovascular;  Laterality: N/A;  . TONSILLECTOMY    . TUBAL LIGATION       OB History   No obstetric history on file.      Home Medications  Prior to Admission medications   Medication Sig Start Date End Date Taking? Authorizing Provider  acetaminophen (TYLENOL) 500 MG tablet Take 1,000 mg by mouth 2 (two) times daily as needed for headache (pain).    [provider]  benzonatate (TESSALON) 100 MG capsule Take 100 mg by mouth 3 (three) times daily as needed for cough.    [provider]  diltiazem (CARDIZEM) 60 MG tablet Take 60 mg by mouth every  6 (six) hours as needed (Rapid AFIB over 100).    [provider]  diltiazem (CARTIA XT) 120 MG 24 hr capsule Take 1 capsule (120 mg total) by mouth 2 (two) times daily. TAKE 1 CAPSULE(120 MG) BY MOUTH EVERY EVENING 10/09/18   Sherran Needs, NP  fluconazole (DIFLUCAN) 150 MG tablet Take 1 tablet (150 mg total) by mouth daily. 10/11/18   Carroll Sage, MD  fluticasone (FLOVENT HFA) 44 MCG/ACT inhaler Inhale 1 puff into the lungs 2 (two) times daily.    [provider]  levalbuterol (XOPENEX HFA) 45 MCG/ACT inhaler Inhale 1 puff into the lungs every 6 (six) hours as needed for shortness of breath. 11/21/17   Carmin Muskrat, MD  losartan (COZAAR) 50 MG tablet TAKE 1 TABLET(50 MG) BY MOUTH DAILY Patient taking differently: Take 50 mg by mouth daily.  09/04/18   [name], MD  magnesium hydroxide (MILK OF MAGNESIA) 400 MG/5ML suspension Take 15-30 mLs by mouth at bedtime as needed for mild constipation. 11/01/17   Harris, Vernie Shanks, PA-C  metroNIDAZOLE (FLAGYL) 500 MG tablet Take 1 tablet (500 mg total) by mouth 2 (two) times daily. 10/11/18   Carroll Sage, MD  montelukast (SINGULAIR) 10 MG tablet Take 1 tablet (10 mg total) by mouth at bedtime. 05/16/18   [name], MD  Multiple Vitamin (MULTIVITAMIN WITH MINERALS) TABS tablet Take 1 tablet by mouth every other day.    [provider]  potassium chloride (K-DUR,KLOR-CON) 10 MEQ tablet Take 1 tablet (10 mEq total) by mouth 2 (two) times daily. 03/08/18   Sherran Needs, NP  rivaroxaban (XARELTO) 20 MG TABS tablet Take 1 tablet (20 mg total) by mouth daily with supper. 08/31/18   Sherran Needs, NP  sucralfate (CARAFATE) 1 GM/10ML suspension Take 10 mLs (1 g total) by mouth 4 (four) times daily. 10/06/18 10/06/19  Bartholomew Crews, MD  tiotropium (SPIRIVA) 18 MCG inhalation capsule Place 1 capsule (18 mcg total) into inhaler and inhale daily. 05/26/18   [name], MD  triamcinolone cream (KENALOG) 0.1 % Apply 1  application topically 2 (two) times daily. Patient taking differently: Apply 1 application topically 2 (two) times daily as needed (rash/itching).  05/06/18   Henderly, Britni A, PA-C    Family History Family History  Problem Relation Age of Onset  . Heart attack Mother        [name] age  . Breast cancer Sister        Late 52s; now s/p mastectomy   . Lung cancer Brother        x 2  . Cancer Sister   . Cancer Sister   . Cancer Sister   . Colon cancer Neg Hx   . Colon polyps Neg Hx   . Esophageal cancer Neg Hx   . Rectal cancer Neg Hx   . Stomach cancer Neg Hx     Social History Social History   Tobacco Use  . Smoking status: Former Smoker    Last attempt to quit: 10/24/2015  Years since quitting: 2.9  . Smokeless tobacco: Never Used  Substance Use Topics  . Alcohol use: No  . Drug use: No    Types: Marijuana    Comment: Smoked marijuana in the past.     Allergies   Albuterol; Celecoxib; and Protonix [pantoprazole sodium]   Review of Systems Review of Systems  Cardiovascular: Positive for chest pain and palpitations.  All other systems reviewed and are negative.    Physical Exam Updated Vital Signs BP (!) 120/104 (BP Location: Right Arm)   Pulse 85   Temp 98.5 F (36.9 C) (Oral)   Resp (!) 22   Ht 5\' 7"  (1.702 m)   Wt 68.9 kg   SpO2 96%   BMI 23.81 kg/m   Physical Exam Vitals signs and nursing note reviewed.  Constitutional:      General: She is not in acute distress.    Appearance: She is well-developed.  HENT:     Head: Atraumatic.  Eyes:     Conjunctiva/sclera: Conjunctivae normal.  Neck:     Musculoskeletal: Neck supple.  Cardiovascular:     Rate and Rhythm: Normal rate and regular rhythm.     Heart sounds: Normal heart sounds.  Pulmonary:     Effort: Pulmonary effort is normal.     Breath sounds: Normal breath sounds.  Chest:     Chest wall: No tenderness.     Comments: Heart monitoring device noted to left upper chest. Abdominal:      Palpations: Abdomen is soft.     Tenderness: There is no abdominal tenderness.  Musculoskeletal:     Right lower leg: No edema.     Left lower leg: No edema.  Skin:    Findings: No rash.  Neurological:     Mental Status: She is alert.  Psychiatric:        Mood and Affect: Mood normal.      ED Treatments / Results  Labs (all labs ordered are listed, but only abnormal results are displayed) Labs Reviewed  BASIC METABOLIC PANEL - Abnormal; Notable for the following components:      Result Value   Glucose, Bld 109 (*)    All other components within normal limits  CBC - Abnormal; Notable for the following components:   RDW 15.9 (*)    All other components within normal limits  I-STAT TROPONIN, ED    EKG EKG Interpretation  Date/Time:  Sunday October 15 2018 09:21:20 EST Ventricular Rate:  87 PR Interval:    QRS Duration: 85 QT Interval:  368 QTC Calculation: 443 R Axis:   54 Text Interpretation:  Sinus rhythm Abnormal R-wave progression, early transition No significant change was found Confirmed by Jola Schmidt [phone]) on 10/15/2018 9:46:45 AM   Radiology Ct Renal Stone Study  Result Date: 10/14/2018 CLINICAL DATA:  68 y/o F; right flank pain with 1 episode of vomiting. EXAM: CT ABDOMEN AND PELVIS WITHOUT CONTRAST TECHNIQUE: Multidetector CT imaging of the abdomen and pelvis was performed following the standard protocol without IV contrast. COMPARISON:  02/14/2018 CT abdomen and pelvis. FINDINGS: Lower chest: No acute abnormality. Hepatobiliary: Stable liver cysts. Otherwise no focal liver abnormality is seen. No gallstones, gallbladder wall thickening, or biliary dilatation. Pancreas: Unremarkable. No pancreatic ductal dilatation or surrounding inflammatory changes. Spleen: Normal in size without focal abnormality. Adrenals/Urinary Tract: Adrenal glands are unremarkable. Punctate nonobstructing stone in the lower pole of right kidney (series 6, image 53). Otherwise kidneys  are normal, without additional renal calculi, focal  lesion, or hydronephrosis. Bladder is unremarkable. Stomach/Bowel: Stomach is within normal limits. Appendix appears normal. No evidence of bowel wall thickening, distention, or inflammatory changes. Vascular/Lymphatic: Aortic atherosclerosis. No enlarged abdominal or pelvic lymph nodes. Reproductive: Uterus and bilateral adnexa are unremarkable. Other: No abdominal wall hernia or abnormality. No abdominopelvic ascites. Musculoskeletal: No fracture is seen. Disc and facet degenerative changes of the spine. No high-grade spinal canal stenosis. Disc bulges encroach on the neural foramen bilaterally at the L3-L5 levels. IMPRESSION: 1. Punctate nonobstructing stone in the lower pole of right kidney. No ureter stone or hydronephrosis identified. 2. Aortic Atherosclerosis (ICD10-I70.0). Electronically Signed   By: Kristine Garbe M.D.   On: 10/14/2018 04:57    Procedures Procedures (including critical care time)  Medications Ordered in ED Medications - No data to display   Initial Impression / Assessment and Plan / ED Course  I have reviewed the triage vital signs and the nursing notes.  Pertinent labs & imaging results that were available during my care of the patient were reviewed by me and considered in my medical decision making (see chart for details).        BP (!) 120/104 (BP Location: Right Arm)   Pulse 85   Temp 98.5 F (36.9 C) (Oral)   Resp (!) 22   Ht 5\' 7"  (1.702 m)   Wt 68.9 kg   SpO2 96%   BMI 23.81 kg/m    Final Clinical Impressions(s) / ED Diagnoses   Final diagnoses:  Heart palpitations    ED Discharge Orders    None     9:45 AM Patient with history of paroxysmal A. fib here with complaints of heart palpitation lasting for approximately 2 to 3 hours earlier today but has since resolved.  She is back to baseline.  She is not tachycardic.  Blood pressure is stable.  Work-up initiated.  Patient is  currently wearing a heart monitor device due to recurrent heart palpitation within the past few weeks.  Recent increase in her diltiazem from 60 mg twice daily to 120 mg twice daily.  10:40 AM Labs are reassuring.  Pt maintain sinus rhythm.  She did admits to drinking soda which may contribute to her sxs.  Recommend avoid caffeinated drink or other stimulant type of intake.  She will send her heart monitoring device in for further assessment.  She will f/u with her doctor for further care.  Return precaution discussed.  Care discussed with Dr. Venora Maples.    Domenic Moras, PA-C 10/15/18 1041    Jola Schmidt, MD 10/15/18 1225

## 2018-10-15 NOTE — Discharge Instructions (Addendum)
Please avoid all caffeinated drinks as it can cause racing of your heart.  Send your heart monitoring device when appropriate for further evaluation. Continue taking your diltiazem as prescribed.  Return if your symptoms worsen or if you have any concerns.

## 2018-10-15 NOTE — ED Triage Notes (Signed)
Pt arrives POV with c/o "heart fluttering and chest pain since 4 am." Pt states she has hx of afib and took medicine at home  (diltiazem 120mg ) but had no relief. Alert nand oriented

## 2018-10-16 ENCOUNTER — Telehealth: Payer: Self-pay | Admitting: Internal Medicine

## 2018-10-16 ENCOUNTER — Other Ambulatory Visit: Payer: Self-pay | Admitting: Internal Medicine

## 2018-10-16 DIAGNOSIS — K219 Gastro-esophageal reflux disease without esophagitis: Secondary | ICD-10-CM

## 2018-10-16 IMAGING — DX DG CHEST 2V
2 series · 2 of 2 positions shown · non-contrast
Comparison: Chest x-ray of 06/28/2017

CLINICAL DATA: Shortness of breath, chest pain and tightness today

EXAM:
CHEST  2 VIEW

[w chest pa]
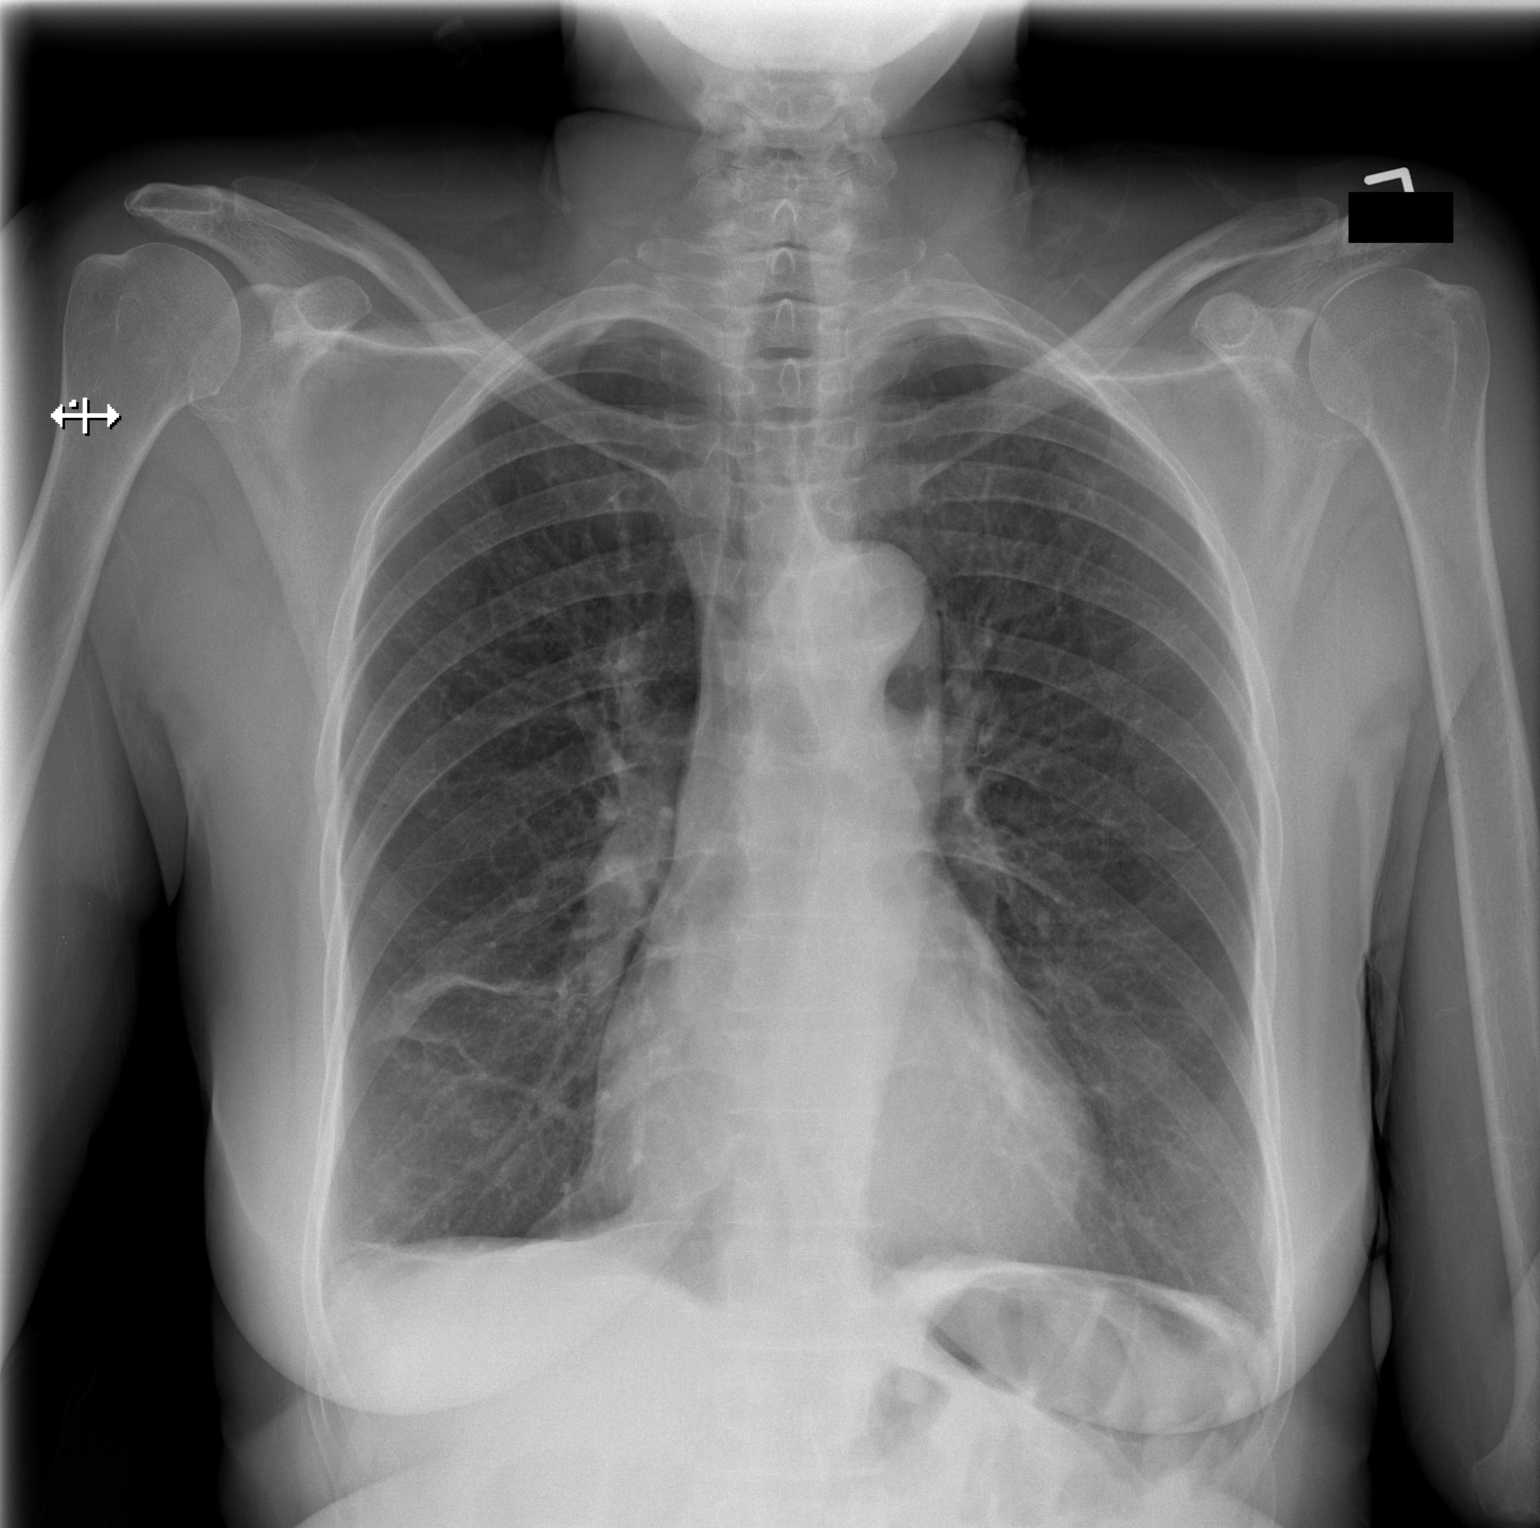

[w chest lat]
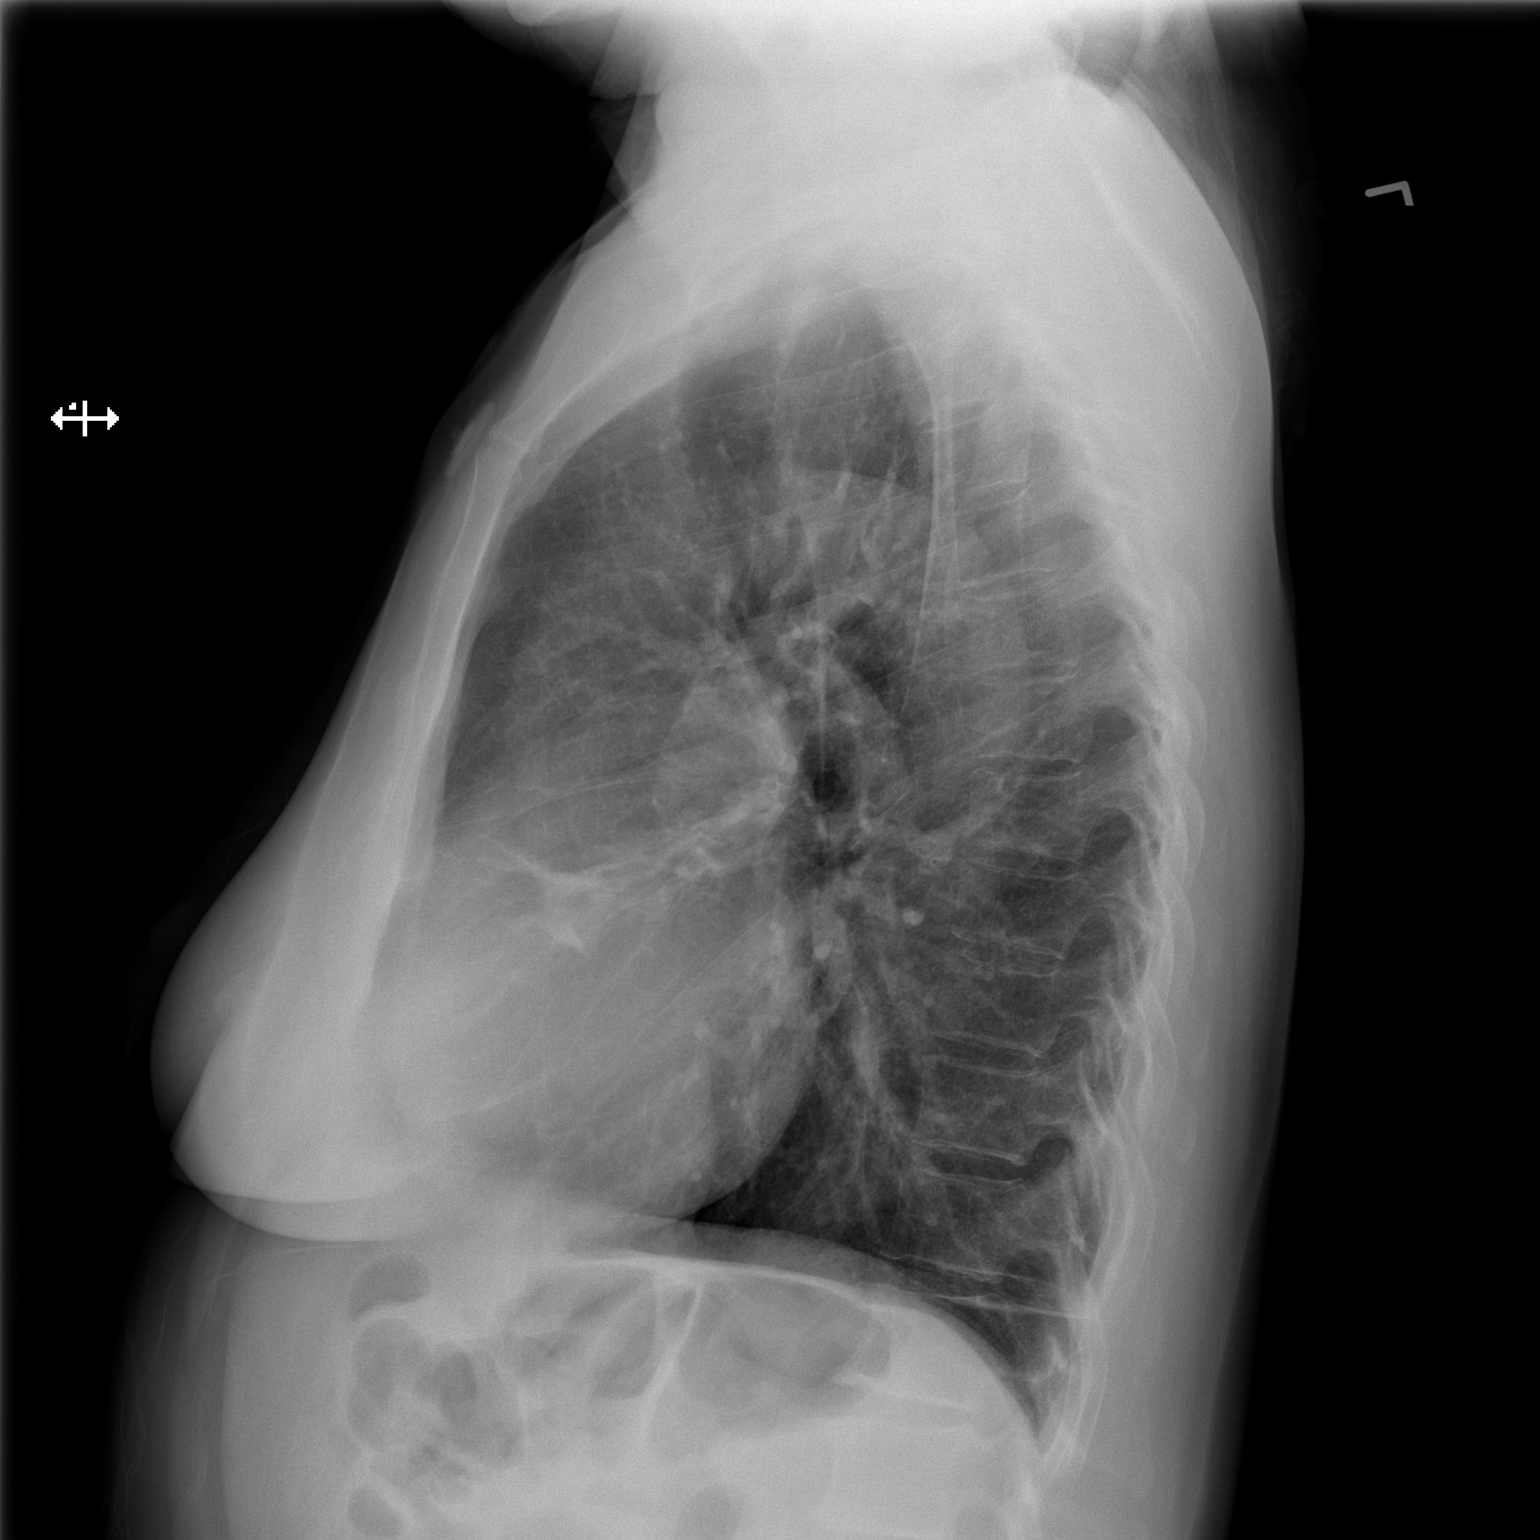

[2 of 2 positions shown; findings below may reference images not displayed]

FINDINGS: Linear scarring or atelectasis is noted in the right middle lobe and
right lung base. No pneumonia or effusion is seen. The lungs are
slightly hyperaerated. Mediastinal and hilar contours are
unremarkable and mild cardiomegaly is stable. No acute bony
abnormality is seen.
IMPRESSION: Linear scarring or atelectasis in the right middle lobe and right
lung base. No definite active process.

## 2018-10-16 MED ORDER — SUCRALFATE 1 G PO TABS: 1.0000 g | ORAL_TABLET | Freq: Four times a day (QID) | ORAL | 1 refills | Status: DC

## 2018-10-16 NOTE — Telephone Encounter (Signed)
Pill form of Carafate was sent to pharmacy as it appears she prefers the pill form over the slurry.

## 2018-10-16 NOTE — Telephone Encounter (Signed)
Requesting a 90-day supply. Thanks 

## 2018-10-16 NOTE — Telephone Encounter (Signed)
Pls call the pt regarding medicine [phone]

## 2018-10-16 NOTE — Telephone Encounter (Signed)
Pt called and is insisting she will not take the Carafate any longer (Telephone note from 2/14 states pt insisted she only wanted Carafate).  Pt requesting something in "pillform" similar to carafate.  Will forward to PCP to advise. SChaplin, RN,BSN

## 2018-10-17 ENCOUNTER — Other Ambulatory Visit: Payer: Self-pay

## 2018-10-17 ENCOUNTER — Encounter: Payer: Self-pay | Admitting: Internal Medicine

## 2018-10-17 NOTE — Telephone Encounter (Signed)
Requesting to speak with a nurse about meds. Please call pt back.  

## 2018-10-17 NOTE — Telephone Encounter (Signed)
Please call pt back.

## 2018-10-18 DIAGNOSIS — H6123 Impacted cerumen, bilateral: Secondary | ICD-10-CM | POA: Diagnosis not present

## 2018-10-18 DIAGNOSIS — Z972 Presence of dental prosthetic device (complete) (partial): Secondary | ICD-10-CM | POA: Diagnosis not present

## 2018-10-18 DIAGNOSIS — R51 Headache: Secondary | ICD-10-CM | POA: Diagnosis not present

## 2018-10-18 DIAGNOSIS — H93292 Other abnormal auditory perceptions, left ear: Secondary | ICD-10-CM | POA: Diagnosis not present

## 2018-10-18 DIAGNOSIS — J342 Deviated nasal septum: Secondary | ICD-10-CM | POA: Diagnosis not present

## 2018-10-20 DIAGNOSIS — I480 Paroxysmal atrial fibrillation: Secondary | ICD-10-CM | POA: Diagnosis not present

## 2018-10-23 ENCOUNTER — Other Ambulatory Visit (HOSPITAL_COMMUNITY): Payer: Self-pay | Admitting: *Deleted

## 2018-10-23 ENCOUNTER — Telehealth (HOSPITAL_COMMUNITY): Payer: Self-pay | Admitting: *Deleted

## 2018-10-23 MED ORDER — DILTIAZEM HCL ER COATED BEADS 120 MG PO CP24: 120.0000 mg | ORAL_CAPSULE | Freq: Two times a day (BID) | ORAL | 2 refills | Status: DC

## 2018-10-23 NOTE — Telephone Encounter (Signed)
-----   Message from Sherran Needs, NP sent at 10/23/2018 12:45 PM EST ----- Please let [name] know that her monitor did not show any afib, 2 episodes of 4-5 beats of SVT and a few palpitations. No need for change in treatment.

## 2018-10-23 NOTE — Telephone Encounter (Signed)
Pt notified. She will continue cardizem 120mg  BID as she is feeling much better since doing so.

## 2018-10-23 NOTE — Telephone Encounter (Signed)
Call made to patient-states she has already spoken with someone.  No further action needed, phone call complete.Shelby Hidden Cassady3/2/20201:25 PM

## 2018-10-24 DIAGNOSIS — J329 Chronic sinusitis, unspecified: Secondary | ICD-10-CM | POA: Diagnosis not present

## 2018-10-24 DIAGNOSIS — R51 Headache: Secondary | ICD-10-CM | POA: Diagnosis not present

## 2018-10-24 DIAGNOSIS — H903 Sensorineural hearing loss, bilateral: Secondary | ICD-10-CM | POA: Diagnosis not present

## 2018-10-27 ENCOUNTER — Other Ambulatory Visit: Payer: Self-pay | Admitting: Physician Assistant

## 2018-10-27 DIAGNOSIS — IMO0001 Reserved for inherently not codable concepts without codable children: Secondary | ICD-10-CM

## 2018-10-27 DIAGNOSIS — H918X1 Other specified hearing loss, right ear: Secondary | ICD-10-CM

## 2018-10-29 IMAGING — DX DG CHEST 2V
2 series · 2 of 2 positions shown · non-contrast
Comparison: 06/30/2017

CLINICAL DATA: Cough and congestion for 2 weeks

EXAM:
CHEST  2 VIEW

[x chest ap]
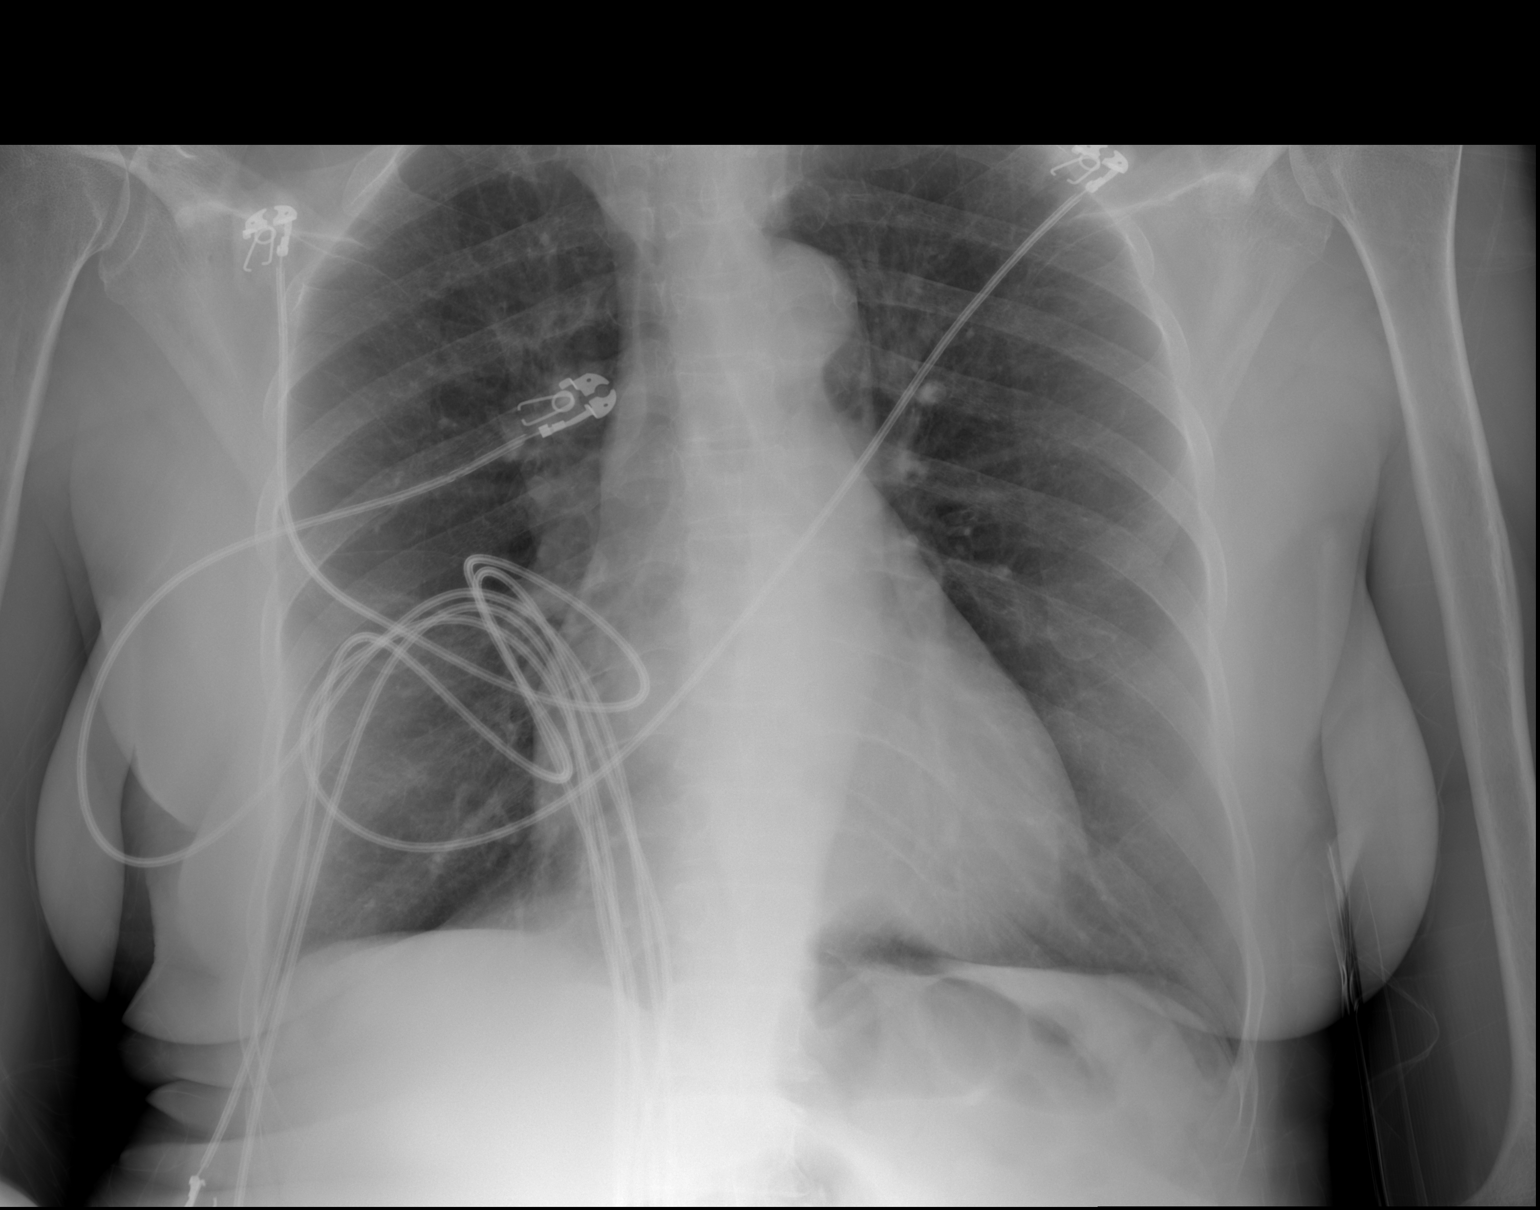

[w chest lat]
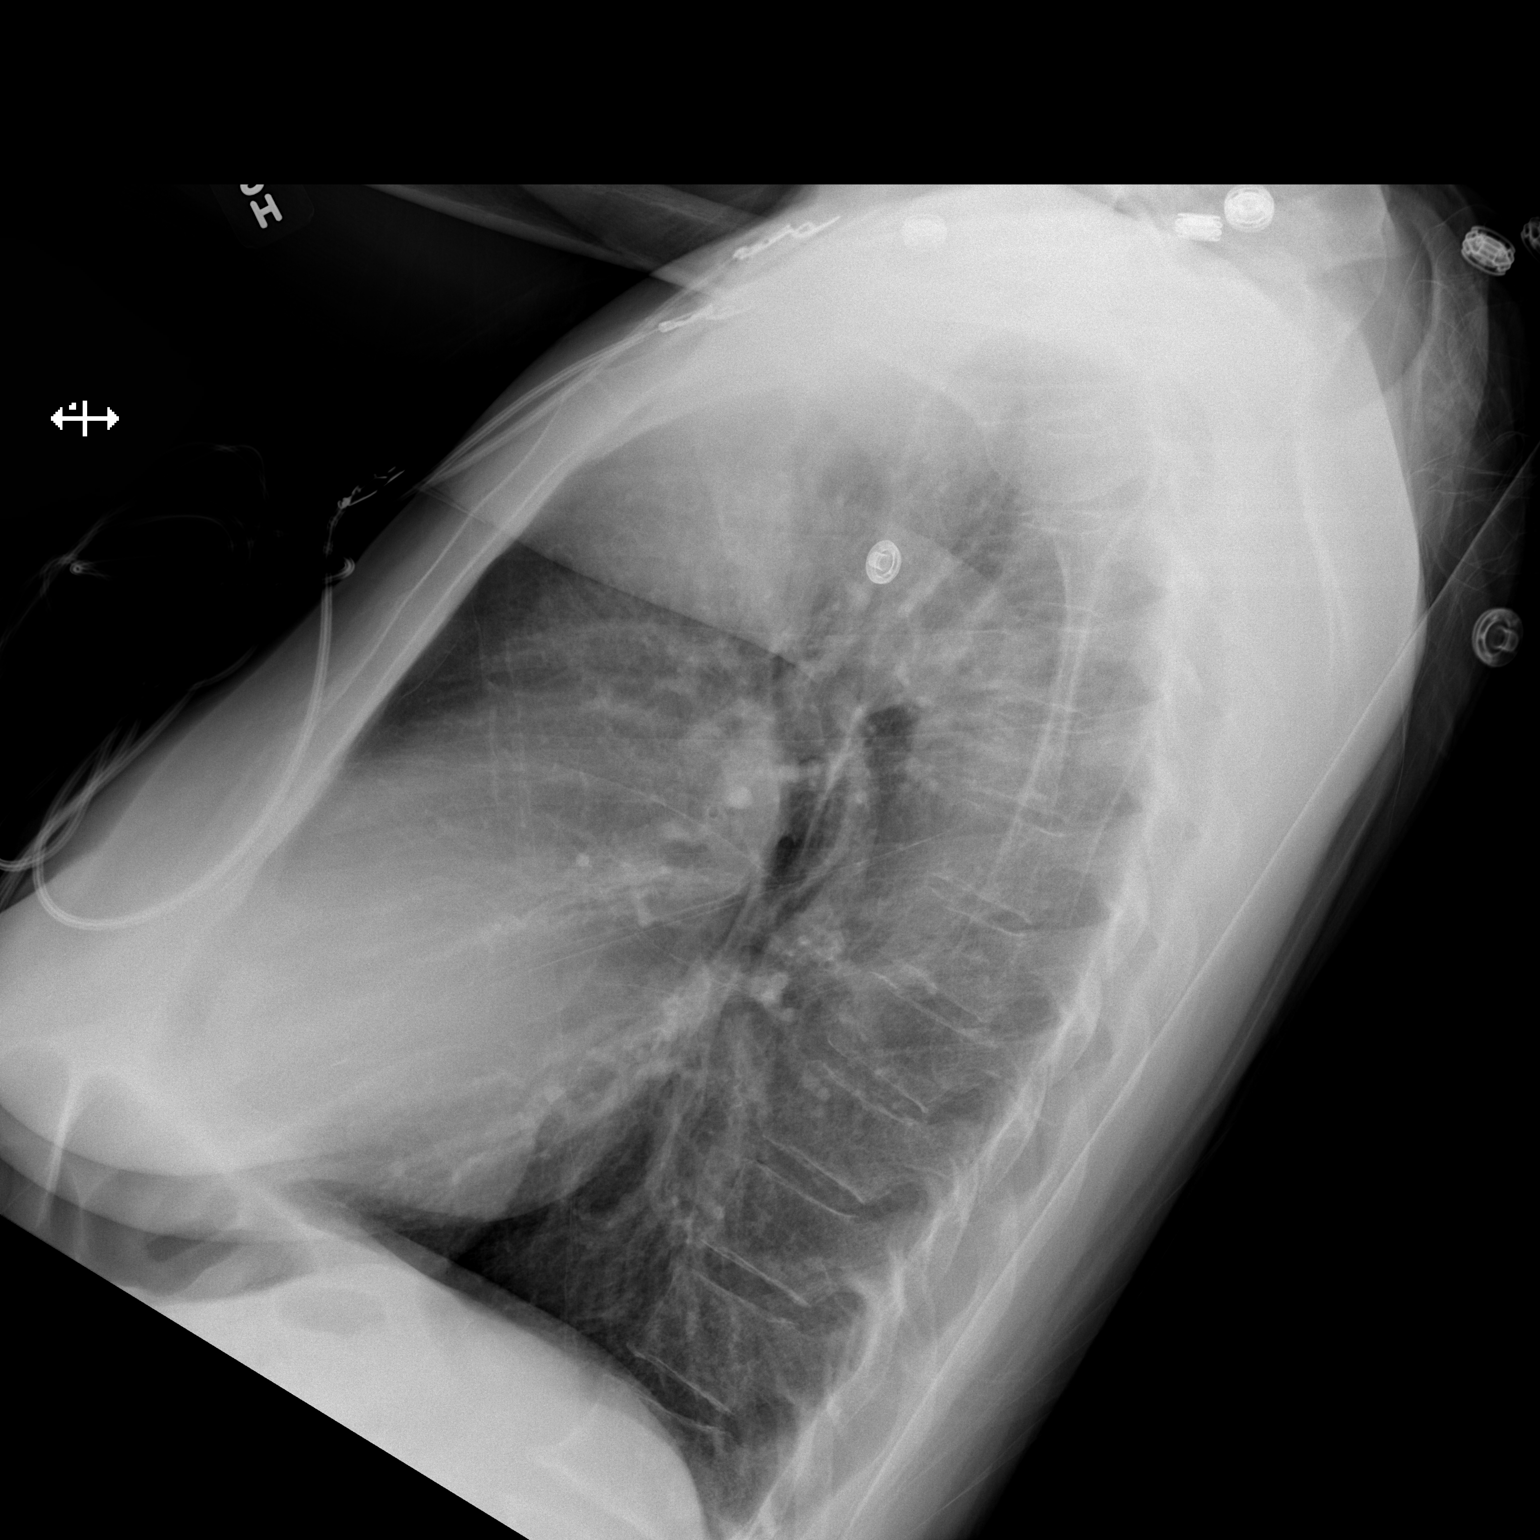

[2 of 2 positions shown; findings below may reference images not displayed]

FINDINGS: Cardiac shadow is within normal limits. Mild aortic calcifications
are seen. The lungs are well aerated bilaterally. No focal
infiltrate or sizable effusion is seen. No acute bony abnormality is
noted.
IMPRESSION: No active cardiopulmonary disease.

## 2018-11-01 DIAGNOSIS — J45901 Unspecified asthma with (acute) exacerbation: Secondary | ICD-10-CM | POA: Diagnosis not present

## 2018-11-01 DIAGNOSIS — J4520 Mild intermittent asthma, uncomplicated: Secondary | ICD-10-CM | POA: Diagnosis not present

## 2018-11-06 ENCOUNTER — Telehealth: Payer: Self-pay

## 2018-11-06 NOTE — Telephone Encounter (Signed)
Requesting to speak with a nurse about med. Please call pt back.  

## 2018-11-07 ENCOUNTER — Other Ambulatory Visit: Payer: Self-pay

## 2018-11-07 NOTE — Telephone Encounter (Signed)
Pt calls and states she would like some LINZESS called in for her. She states" my sister has some and i took it and it worked real good, didn't gripe my stomach, I like that laxative"

## 2018-11-08 ENCOUNTER — Other Ambulatory Visit: Payer: Self-pay | Admitting: Internal Medicine

## 2018-11-08 ENCOUNTER — Other Ambulatory Visit: Payer: Self-pay

## 2018-11-08 ENCOUNTER — Ambulatory Visit
Admission: RE | Admit: 2018-11-08 | Discharge: 2018-11-08 | Disposition: A | Discharge status: Home / Self Care | Payer: Medicare Other | Source: Ambulatory Visit | Attending: Physician Assistant | Admitting: Physician Assistant

## 2018-11-08 DIAGNOSIS — IMO0001 Reserved for inherently not codable concepts without codable children: Secondary | ICD-10-CM

## 2018-11-08 DIAGNOSIS — H918X1 Other specified hearing loss, right ear: Secondary | ICD-10-CM

## 2018-11-08 DIAGNOSIS — H9193 Unspecified hearing loss, bilateral: Secondary | ICD-10-CM | POA: Diagnosis not present

## 2018-11-08 DIAGNOSIS — R51 Headache: Secondary | ICD-10-CM | POA: Diagnosis not present

## 2018-11-08 MED ORDER — LINACLOTIDE 145 MCG PO CAPS: 145.0000 ug | ORAL_CAPSULE | Freq: Every day | ORAL | 1 refills | Status: DC

## 2018-11-08 MED ORDER — GADOBENATE DIMEGLUMINE 529 MG/ML IV SOLN
15.0000 mL | Freq: Once | INTRAVENOUS | Status: AC | PRN
  Administered 2018-11-08: 15 mL via INTRAVENOUS

## 2018-11-08 NOTE — Addendum Note (Signed)
Addended by: Hulan Fray on: 11/08/2018 04:24 PM   Modules accepted: Orders

## 2018-11-08 NOTE — Telephone Encounter (Signed)
She can have Linzess, I did send a prescription to her pharmacy at [facility]. I am not sure whether her insurance will approve or not.

## 2018-11-21 ENCOUNTER — Emergency Department (HOSPITAL_COMMUNITY): Payer: Medicare Other

## 2018-11-21 ENCOUNTER — Emergency Department (HOSPITAL_COMMUNITY)
Admission: EM | Admit: 2018-11-21 | Discharge: 2018-11-21 | Disposition: A | Discharge status: Home / Self Care | Payer: Medicare Other | Attending: Emergency Medicine | Admitting: Emergency Medicine

## 2018-11-21 ENCOUNTER — Encounter (HOSPITAL_COMMUNITY): Payer: Self-pay | Admitting: Emergency Medicine

## 2018-11-21 DIAGNOSIS — Z87891 Personal history of nicotine dependence: Secondary | ICD-10-CM | POA: Insufficient documentation

## 2018-11-21 DIAGNOSIS — R1013 Epigastric pain: Secondary | ICD-10-CM | POA: Diagnosis present

## 2018-11-21 DIAGNOSIS — K8020 Calculus of gallbladder without cholecystitis without obstruction: Secondary | ICD-10-CM | POA: Insufficient documentation

## 2018-11-21 DIAGNOSIS — R1011 Right upper quadrant pain: Secondary | ICD-10-CM

## 2018-11-21 DIAGNOSIS — J45909 Unspecified asthma, uncomplicated: Secondary | ICD-10-CM | POA: Insufficient documentation

## 2018-11-21 DIAGNOSIS — I10 Essential (primary) hypertension: Secondary | ICD-10-CM | POA: Diagnosis not present

## 2018-11-21 DIAGNOSIS — Z79899 Other long term (current) drug therapy: Secondary | ICD-10-CM | POA: Insufficient documentation

## 2018-11-21 DIAGNOSIS — Z7901 Long term (current) use of anticoagulants: Secondary | ICD-10-CM | POA: Insufficient documentation

## 2018-11-21 DIAGNOSIS — R112 Nausea with vomiting, unspecified: Secondary | ICD-10-CM | POA: Diagnosis not present

## 2018-11-21 LAB — CBC
HCT: 36.3 % (ref 36.0–46.0)
Hemoglobin: 11.3 g/dL — ABNORMAL LOW (ref 12.0–15.0)
MCH: 25.6 pg — ABNORMAL LOW (ref 26.0–34.0)
MCHC: 31.1 g/dL (ref 30.0–36.0)
MCV: 82.3 fL (ref 80.0–100.0)
NRBC: 0 % (ref 0.0–0.2)
PLATELETS: 307 10*3/uL (ref 150–400)
RBC: 4.41 MIL/uL (ref 3.87–5.11)
RDW: 15.3 % (ref 11.5–15.5)
WBC: 6.3 10*3/uL (ref 4.0–10.5)

## 2018-11-21 LAB — URINALYSIS, ROUTINE W REFLEX MICROSCOPIC
Bacteria, UA: NONE SEEN
Bilirubin Urine: NEGATIVE
Glucose, UA: NEGATIVE mg/dL
Ketones, ur: NEGATIVE mg/dL
Leukocytes,Ua: NEGATIVE
Nitrite: NEGATIVE
PROTEIN: 30 mg/dL — AB
Specific Gravity, Urine: 1.017 (ref 1.005–1.030)
pH: 5 (ref 5.0–8.0)

## 2018-11-21 LAB — COMPREHENSIVE METABOLIC PANEL
ALT: 31 U/L (ref 0–44)
AST: 26 U/L (ref 15–41)
Albumin: 3.5 g/dL (ref 3.5–5.0)
Alkaline Phosphatase: 86 U/L (ref 38–126)
Anion gap: 10 (ref 5–15)
BUN: 12 mg/dL (ref 8–23)
CO2: 21 mmol/L — AB (ref 22–32)
Calcium: 9.5 mg/dL (ref 8.9–10.3)
Chloride: 105 mmol/L (ref 98–111)
Creatinine, Ser: 0.82 mg/dL (ref 0.44–1.00)
GFR calc Af Amer: >60 mL/min (ref >60)
GFR calc non Af Amer: >60 mL/min (ref >60)
Glucose, Bld: 119 mg/dL — ABNORMAL HIGH (ref 70–99)
Potassium: 3 mmol/L — ABNORMAL LOW (ref 3.5–5.1)
SODIUM: 136 mmol/L (ref 135–145)
Total Bilirubin: 0.3 mg/dL (ref 0.3–1.2)
Total Protein: 7.3 g/dL (ref 6.5–8.1)

## 2018-11-21 LAB — LIPASE, BLOOD: Lipase: 39 U/L (ref 11–51)

## 2018-11-21 MED ORDER — HYDROCODONE-ACETAMINOPHEN 5-325 MG PO TABS: 2.0000 | ORAL_TABLET | Freq: Four times a day (QID) | ORAL | 0 refills | Status: DC | PRN

## 2018-11-21 MED ORDER — ONDANSETRON HCL 4 MG/2ML IJ SOLN
4.0000 mg | Freq: Once | INTRAMUSCULAR | Status: AC
  Administered 2018-11-21: 4 mg via INTRAVENOUS
  Filled 2018-11-21: qty 2

## 2018-11-21 MED ORDER — SODIUM CHLORIDE 0.9% FLUSH
3.0000 mL | Freq: Once | INTRAVENOUS | Status: AC
  Administered 2018-11-21: 3 mL via INTRAVENOUS

## 2018-11-21 MED ORDER — POTASSIUM CHLORIDE CRYS ER 20 MEQ PO TBCR: 20.0000 meq | EXTENDED_RELEASE_TABLET | Freq: Two times a day (BID) | ORAL | 0 refills | Status: DC

## 2018-11-21 MED ORDER — ONDANSETRON 4 MG PO TBDP: 4.0000 mg | ORAL_TABLET | Freq: Three times a day (TID) | ORAL | 0 refills | Status: DC | PRN

## 2018-11-21 MED ORDER — SODIUM CHLORIDE 0.9 % IV SOLN
Freq: Once | INTRAVENOUS | Status: AC
  Administered 2018-11-21: 22:00:00 via INTRAVENOUS

## 2018-11-21 MED ORDER — ONDANSETRON 4 MG PO TBDP
4.0000 mg | ORAL_TABLET | Freq: Once | ORAL | Status: AC | PRN
  Administered 2018-11-21: 4 mg via ORAL
  Filled 2018-11-21: qty 1

## 2018-11-21 MED ORDER — MORPHINE SULFATE (PF) 4 MG/ML IV SOLN
6.0000 mg | Freq: Once | INTRAVENOUS | Status: AC
  Administered 2018-11-21: 6 mg via INTRAVENOUS
  Filled 2018-11-21: qty 2

## 2018-11-21 MED ORDER — HYDROCODONE-ACETAMINOPHEN 5-325 MG PO TABS
2.0000 | ORAL_TABLET | Freq: Once | ORAL | Status: AC
  Administered 2018-11-21: 2 via ORAL
  Filled 2018-11-21: qty 2

## 2018-11-21 NOTE — ED Notes (Signed)
Patient verbalizes understanding of discharge instructions. Opportunity for questioning and answers were provided. Armband removed by staff, pt discharged from ED by wheelchair   

## 2018-11-21 NOTE — ED Triage Notes (Signed)
Pt reports constant RUQ abdominal pain that radiates to back, started throwing up tonight after eating.

## 2018-11-21 NOTE — Discharge Instructions (Addendum)
Avoid fatty foods. See the included Gallbladder Eating Plan for instructions.  Call a General Surgeon for urgent follow-up in the next week or two for your gall stones

## 2018-11-21 NOTE — ED Provider Notes (Signed)
[facility] EMERGENCY DEPARTMENT Provider Note   CSN: [id number] Arrival date & time: 11/21/18  1902    History   Chief Complaint Chief Complaint  Patient presents with  . Abdominal Pain  . Emesis    HPI [name] is a 68 y.o. female.     HPI   68 year old female with extensive past medical history as below here with abdominal pain.  The patient states that over the last 2 nights, she has had acute onset of epigastric and right upper quadrant pain radiating towards her right shoulder blade.  The symptoms seem to occur after eating a large dinner every night.  They then resolve over several hours.  She has a history of gallstones per report, but has never been referred for these.  No urinary symptoms.  She had some mild nausea and nonbilious, nonbloody emesis.  No diarrhea.  No fever chills.  Symptoms are worse with palpation and eating.  No alleviating factors.  Past Medical History:  Diagnosis Date  . A-fib ([place])   . Allergy    seasonal  . Asthma   . Chronic anticoagulation   . Eczema   . GERD (gastroesophageal reflux disease)   . Hypertension   . PAF (paroxysmal atrial fibrillation) ([place]) 11/26/2015   April/May 2017: Multiple ED visits for A. Fib.  02/12/16: Improved on diltiazem IV. Cardiology consulted in the ED and discharge patient with diltiazem XR 120 mg once daily with 60 mg by mouth every 6 hours as needed for symptomatic palpitations, flecainide 50 mg twice daily. Referred to the atrial fibrillation clinic.  02/17/16: Seen in the atrial fibrillation clinic by NP Roderic Palau. Normal sin    Patient Active Problem List   Diagnosis Date Noted  . Vaginal discharge 10/11/2018  . Vaginal bleeding 10/11/2018  . History of radiofrequency ablation (RFA) procedure for cardiac arrhythmia   . Asthma exacerbation 08/28/2018  . Chronic congestion of paranasal sinus 06/14/2018  . Primary osteoarthritis involving multiple joints 06/14/2018  . Acute pain  of left knee 05/15/2018  . Hyperpigmented skin lesion 09/09/2017  . Osteoporosis without current pathological fracture 04/15/2017  . Normocytic anemia 10/19/2016  . Hyperlipidemia 03/18/2016  . Special screening for malignant neoplasms, colon 03/17/2016  . Cervical cancer screening 03/17/2016  . Assistance with transportation 03/17/2016  . HTN (hypertension), benign   . GERD (gastroesophageal reflux disease)   . PAF (paroxysmal atrial fibrillation) ([place]) 11/26/2015  . Chronic anticoagulation 11/26/2015  . Marijuana abuse 06/11/2015    Past Surgical History:  Procedure Laterality Date  . ATRIAL FIBRILLATION ABLATION N/A 09/30/2017   Procedure: ATRIAL FIBRILLATION ABLATION;  Surgeon: Thompson Grayer, MD;  Location: [facility];  Service: Cardiovascular;  Laterality: N/A;  . TONSILLECTOMY    . TUBAL LIGATION       OB History   No obstetric history on file.      Home Medications    Prior to Admission medications   Medication Sig Start Date End Date Taking? Authorizing Provider  acetaminophen (TYLENOL) 500 MG tablet Take 1,000 mg by mouth 2 (two) times daily as needed for headache (pain).    [provider]  benzonatate (TESSALON) 100 MG capsule Take 100 mg by mouth 3 (three) times daily as needed for cough.    [provider]  diltiazem (CARDIZEM) 60 MG tablet Take 60 mg by mouth every 6 (six) hours as needed (Rapid AFIB over 100).    [provider]  diltiazem (CARTIA XT) 120  MG 24 hr capsule Take 1 capsule (120 mg total) by mouth 2 (two) times daily. 10/23/18   Sherran Needs, NP  fluconazole (DIFLUCAN) 150 MG tablet Take 1 tablet (150 mg total) by mouth daily. 10/11/18   Carroll Sage, MD  fluticasone (FLOVENT HFA) 44 MCG/ACT inhaler Inhale 1 puff into the lungs 2 (two) times daily.    [provider]  HYDROcodone-acetaminophen (NORCO/VICODIN) 5-325 MG tablet Take 2 tablets by mouth every 6 (six) hours as needed for moderate pain or severe  pain. 11/21/18   Duffy Bruce, MD  levalbuterol [facility] HFA) 45 MCG/ACT inhaler Inhale 1 puff into the lungs every 6 (six) hours as needed for shortness of breath. 11/21/17   Carmin Muskrat, MD  linaclotide [facility]) 145 MCG CAPS capsule Take 1 capsule (145 mcg total) by mouth daily before breakfast. 11/08/18   [name], MD  losartan (COZAAR) 50 MG tablet TAKE 1 TABLET(50 MG) BY MOUTH DAILY Patient taking differently: Take 50 mg by mouth daily.  09/04/18   [name], MD  magnesium hydroxide (MILK OF MAGNESIA) 400 MG/5ML suspension Take 15-30 mLs by mouth at bedtime as needed for mild constipation. 11/01/17   Harris, Vernie Shanks, PA-C  metroNIDAZOLE (FLAGYL) 500 MG tablet Take 1 tablet (500 mg total) by mouth 2 (two) times daily. 10/11/18   Carroll Sage, MD  montelukast (SINGULAIR) 10 MG tablet Take 1 tablet (10 mg total) by mouth at bedtime. 05/16/18   [name], MD  Multiple Vitamin (MULTIVITAMIN WITH MINERALS) TABS tablet Take 1 tablet by mouth every other day.    [provider]  ondansetron (ZOFRAN ODT) 4 MG disintegrating tablet Take 1 tablet (4 mg total) by mouth every 8 (eight) hours as needed for nausea or vomiting. 11/21/18   Duffy Bruce, MD  potassium chloride SA (K-DUR,KLOR-CON) 20 MEQ tablet Take 1 tablet (20 mEq total) by mouth 2 (two) times daily for 3 days. 11/21/18 11/24/18  Duffy Bruce, MD  rivaroxaban (XARELTO) 20 MG TABS tablet Take 1 tablet (20 mg total) by mouth daily with supper. 08/31/18   Sherran Needs, NP  sucralfate (CARAFATE) 1 g tablet Take 1 tablet (1 g total) by mouth 4 (four) times daily. 10/16/18 10/16/19  Oval Linsey, MD  tiotropium (SPIRIVA) 18 MCG inhalation capsule Place 1 capsule (18 mcg total) into inhaler and inhale daily. 05/26/18   [name], MD  triamcinolone cream (KENALOG) 0.1 % Apply 1 application topically 2 (two) times daily. Patient taking differently: Apply 1 application topically 2 (two) times daily as needed  (rash/itching).  05/06/18   Henderly, Britni A, PA-C    Family History Family History  Problem Relation Age of Onset  . Heart attack Mother        [name] age  . Breast cancer Sister        Late 45s; now s/p mastectomy   . Lung cancer Brother        x 2  . Cancer Sister   . Cancer Sister   . Cancer Sister   . Colon cancer Neg Hx   . Colon polyps Neg Hx   . Esophageal cancer Neg Hx   . Rectal cancer Neg Hx   . Stomach cancer Neg Hx     Social History Social History   Tobacco Use  . Smoking status: Former Smoker    Last attempt to quit: 10/24/2015    Years since quitting: 3.0  . Smokeless tobacco: Never Used  Substance Use Topics  .  Alcohol use: No  . Drug use: Not Currently    Types: Marijuana    Comment: Smoked marijuana in the past.     Allergies   Albuterol; Celecoxib; and Protonix [pantoprazole sodium]   Review of Systems Review of Systems  Constitutional: Positive for fatigue. Negative for chills and fever.  HENT: Negative for congestion, rhinorrhea and sore throat.   Eyes: Negative for visual disturbance.  Respiratory: Negative for cough, shortness of breath and wheezing.   Cardiovascular: Negative for chest pain and leg swelling.  Gastrointestinal: Positive for abdominal pain, nausea and vomiting. Negative for diarrhea.  Genitourinary: Negative for dysuria, flank pain, vaginal bleeding and vaginal discharge.  Musculoskeletal: Negative for neck pain.  Skin: Negative for rash.  Allergic/Immunologic: Negative for immunocompromised state.  Neurological: Negative for syncope and headaches.  Hematological: Does not bruise/bleed easily.  All other systems reviewed and are negative.    Physical Exam Updated Vital Signs BP 123/72   Pulse 74   Temp 98.7 F (37.1 C) (Oral)   Resp (!) 24   SpO2 98%   Physical Exam Vitals signs and nursing note reviewed.  Constitutional:      General: She is not in acute distress.    Appearance: She is well-developed.   HENT:     Head: Normocephalic and atraumatic.  Eyes:     Conjunctiva/sclera: Conjunctivae normal.  Neck:     Musculoskeletal: Neck supple.  Cardiovascular:     Rate and Rhythm: Normal rate and regular rhythm.     Heart sounds: Normal heart sounds. No murmur. No friction rub.  Pulmonary:     Effort: Pulmonary effort is normal. No respiratory distress.     Breath sounds: Normal breath sounds. No wheezing or rales.  Abdominal:     General: There is no distension.     Palpations: Abdomen is soft.     Tenderness: There is abdominal tenderness in the right upper quadrant. There is no guarding or rebound. Negative signs include Murphy's sign.     Hernia: No hernia is present.  Skin:    General: Skin is warm.     Capillary Refill: Capillary refill takes less than 2 seconds.  Neurological:     Mental Status: She is alert and oriented to person, place, and time.     Motor: No abnormal muscle tone.      ED Treatments / Results  Labs (all labs ordered are listed, but only abnormal results are displayed) Labs Reviewed  COMPREHENSIVE METABOLIC PANEL - Abnormal; Notable for the following components:      Result Value   Potassium 3.0 (*)    CO2 21 (*)    Glucose, Bld 119 (*)    All other components within normal limits  CBC - Abnormal; Notable for the following components:   Hemoglobin 11.3 (*)    MCH 25.6 (*)    All other components within normal limits  URINALYSIS, ROUTINE W REFLEX MICROSCOPIC - Abnormal; Notable for the following components:   APPearance HAZY (*)    Hgb urine dipstick MODERATE (*)    Protein, ur 30 (*)    All other components within normal limits  LIPASE, BLOOD    EKG None  Radiology US Abdomen Limited Ruq  Result Date: 11/21/2018 CLINICAL DATA:  Right upper quadrant abdominal pain. EXAM: ULTRASOUND ABDOMEN LIMITED RIGHT UPPER QUADRANT COMPARISON:  CT scan of October 14, 2018 and February 14, 2018. FINDINGS: Gallbladder: Multiple gallstones are noted. No  gallbladder wall thickening or pericholecystic fluid is  noted. No sonographic Murphy's sign is noted. Common bile duct: Diameter: 5 mm which is within normal limits. Liver: Ill-defined area of increased echogenicity is noted posteriorly in right hepatic lobe measuring approximately 5 x 3 cm. Otherwise within normal limits in parenchymal echogenicity. Portal vein is patent on color Doppler imaging with normal direction of blood flow towards the liver. IMPRESSION: Cholelithiasis without evidence of cholecystitis. Ill-defined area of increased echogenicity is noted posteriorly right hip which most likely represents either artifact or focal fatty infiltration. Electronically Signed   By: Marijo Conception, M.D.   On: 11/21/2018 21:33    Procedures Procedures (including critical care time)  Medications Ordered in ED Medications  sodium chloride flush (NS) 0.9 % injection 3 mL (3 mLs Intravenous Given 11/21/18 2137)  ondansetron (ZOFRAN-ODT) disintegrating tablet 4 mg (4 mg Oral Given 11/21/18 1917)  morphine 4 MG/ML injection 6 mg (6 mg Intravenous Given 11/21/18 2135)  ondansetron (ZOFRAN) injection 4 mg (4 mg Intravenous Given 11/21/18 2135)  0.9 %  sodium chloride infusion ( Intravenous New Bag/Given 11/21/18 2145)  HYDROcodone-acetaminophen (NORCO/VICODIN) 5-325 MG per tablet 2 tablet (2 tablets Oral Given 11/21/18 2235)     Initial Impression / Assessment and Plan / ED Course  I have reviewed the triage vital signs and the nursing notes.  Pertinent labs & imaging results that were available during my care of the patient were reviewed by me and considered in my medical decision making (see chart for details).        68 year old female here with intermittent epigastric and right upper quadrant pain, worse after eating.  Suspect this is symptomatic gallstones.  She has no significant leukocytosis.  LFTs and bilirubin are normal.  Lipase is normal.  Ultrasound confirms cholelithiasis without evidence  of cholecystitis or choledocholithiasis.  Her symptoms are resolved in the emergency department.  Will refer her for surgery, advised gallbladder eating plan, and give brief course of analgesics.  Return precautions given. No other TTP. She's had neg CT with this pain in the past, no significant lower TTP to suggest appendicitis, diverticulitis, obstruction.  Final Clinical Impressions(s) / ED Diagnoses   Final diagnoses:  RUQ pain  Symptomatic cholelithiasis    ED Discharge Orders         Ordered    HYDROcodone-acetaminophen (NORCO/VICODIN) 5-325 MG tablet  Every 6 hours PRN     11/21/18 2321    ondansetron (ZOFRAN ODT) 4 MG disintegrating tablet  Every 8 hours PRN     11/21/18 2321    potassium chloride SA (K-DUR,KLOR-CON) 20 MEQ tablet  2 times daily     11/21/18 2333           Duffy Bruce, MD 11/21/18 2334

## 2018-11-22 IMAGING — DX DG CHEST 2V
2 series · 2 of 2 positions shown · non-contrast
Comparison: 07/13/2017 and earlier.

CLINICAL DATA: 66 y/o female status post asthma attack this
morning. Subsequent a fib. Reports chest congestion and
nonproductive cough for 2 weeks. Chest tightness and shortness of
breath.

EXAM:
CHEST  2 VIEW

[chest lat]
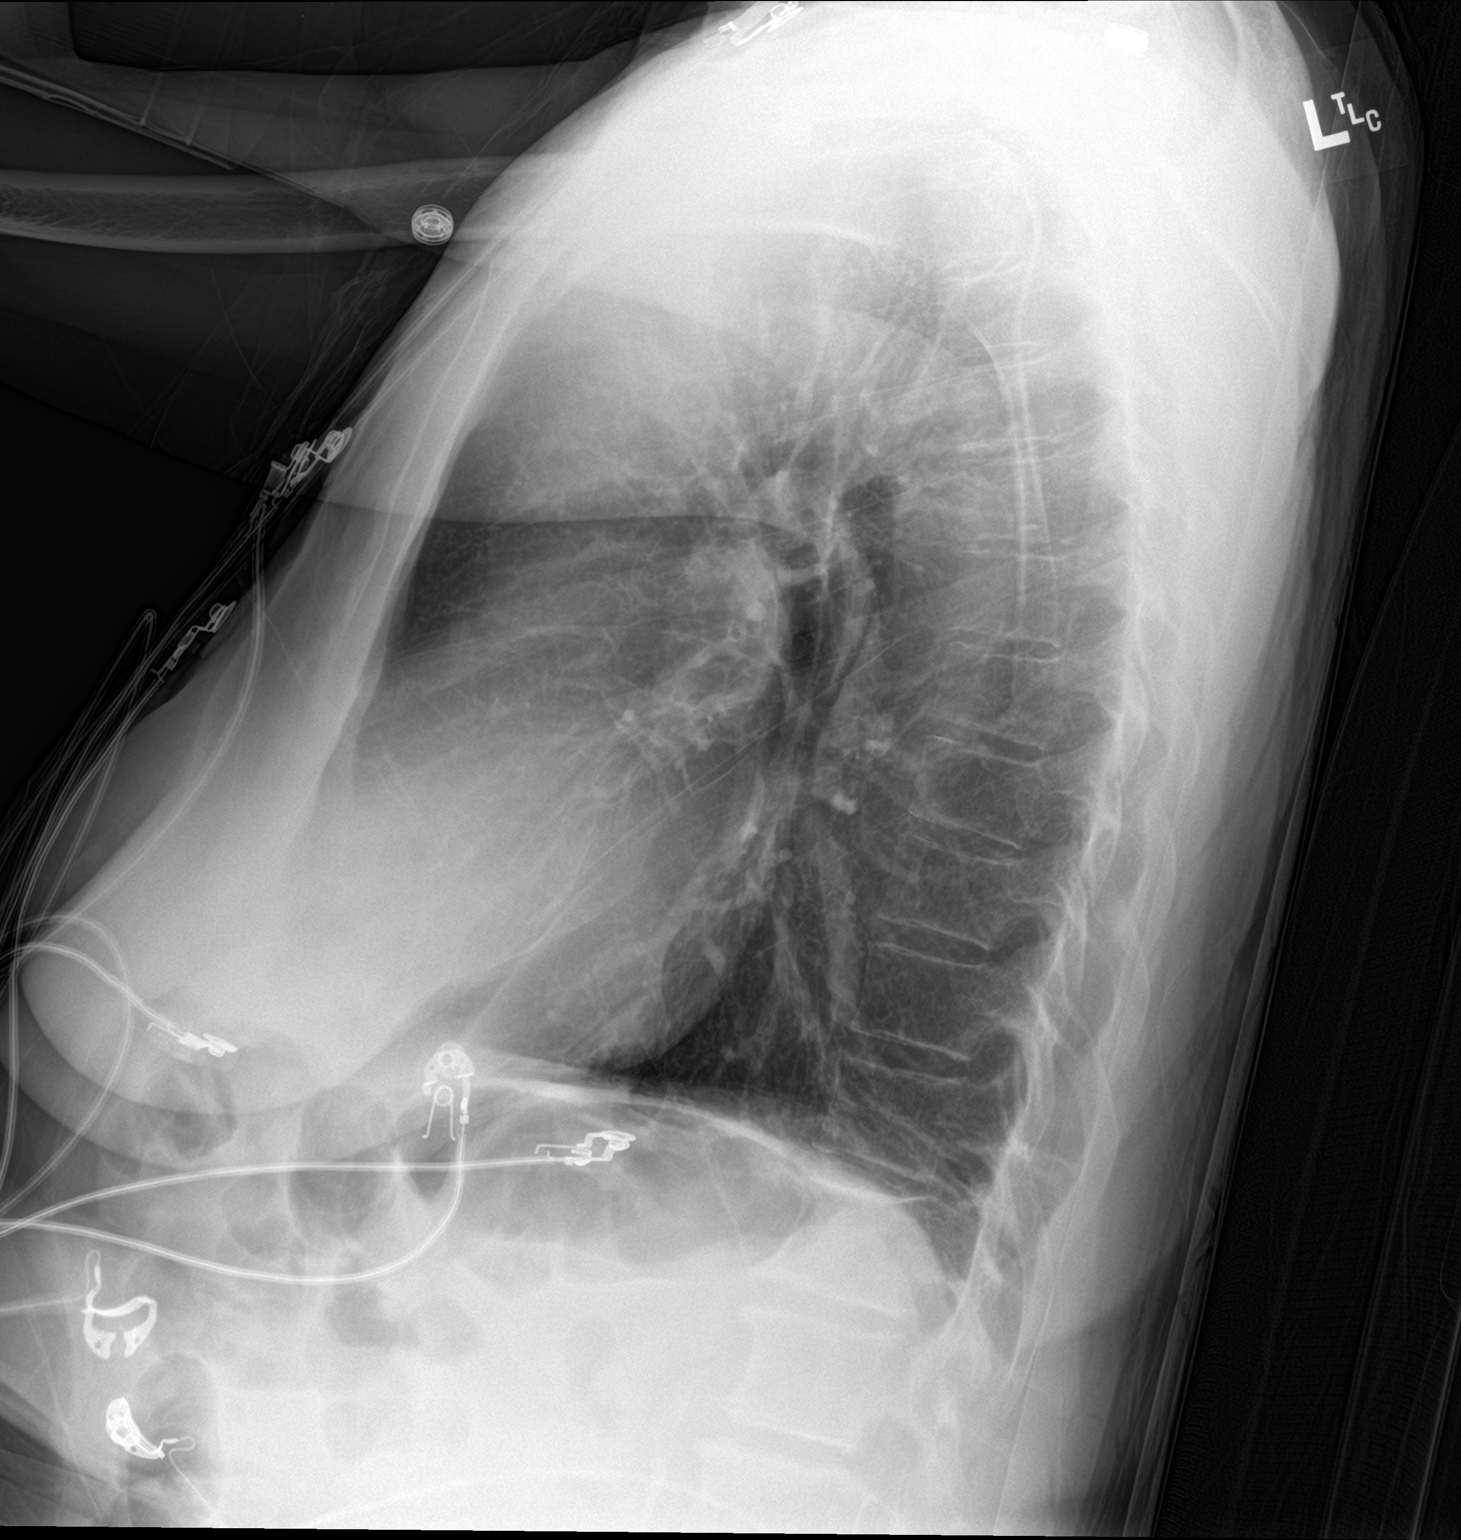

[chest ap]
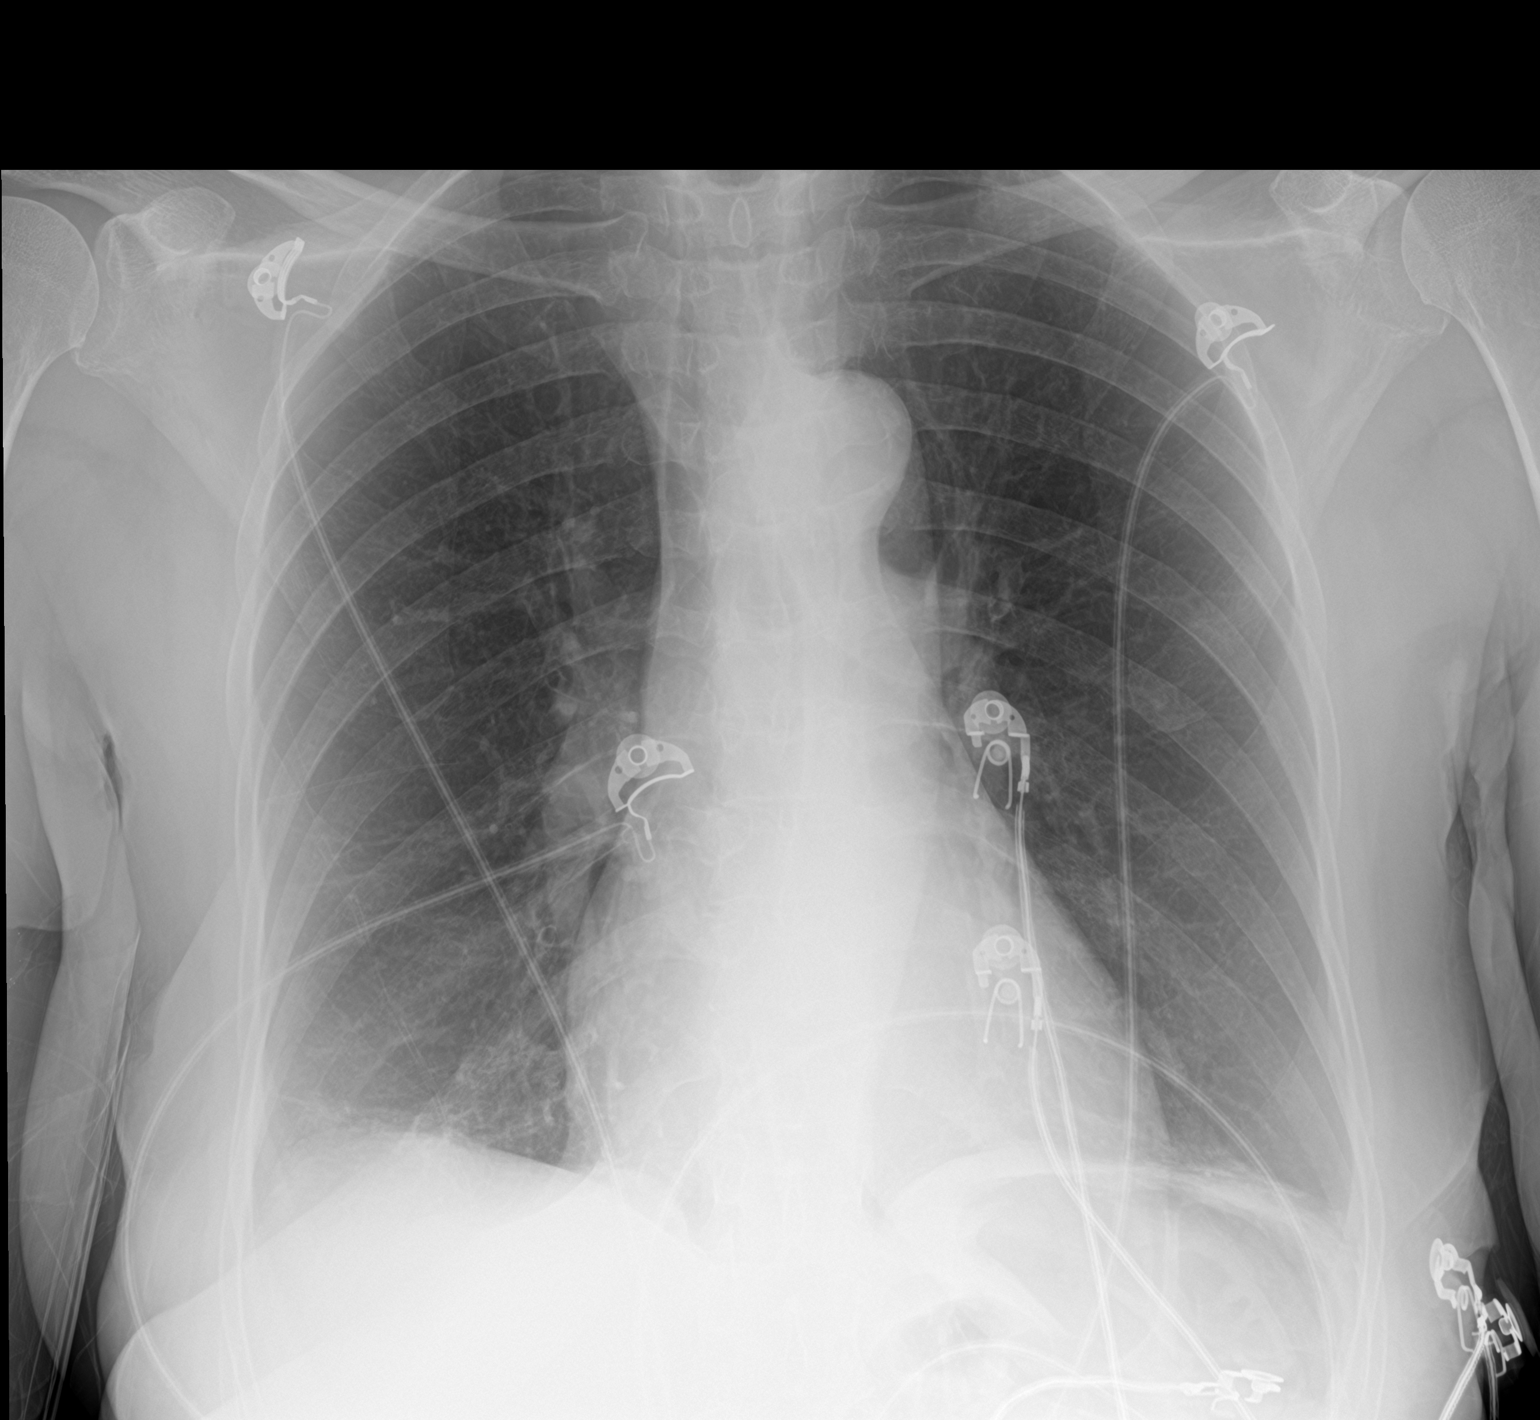

[2 of 2 positions shown; findings below may reference images not displayed]

FINDINGS: Semi upright AP and lateral views of the chest. Stable lung volumes
at the upper limits of normal to hyperinflated. Stable mild
cardiomegaly. Other mediastinal contours are within normal limits.
Visualized tracheal air column is within normal limits. No
pneumothorax, pulmonary edema or pleural effusion. Linear
atelectasis at both lung bases. No other confluent pulmonary
opacity. No acute osseous abnormality identified. Negative visible
bowel gas pattern.
IMPRESSION: Mild atelectasis at both lung bases. No other acute cardiopulmonary
abnormality.

## 2018-11-23 IMAGING — CR DG CHEST 2V
2 series · 2 of 2 positions shown · non-contrast
Comparison: 08/06/2017

CLINICAL DATA: Atrial fibrillation and chest pain

EXAM:
CHEST  2 VIEW

[chest pa]
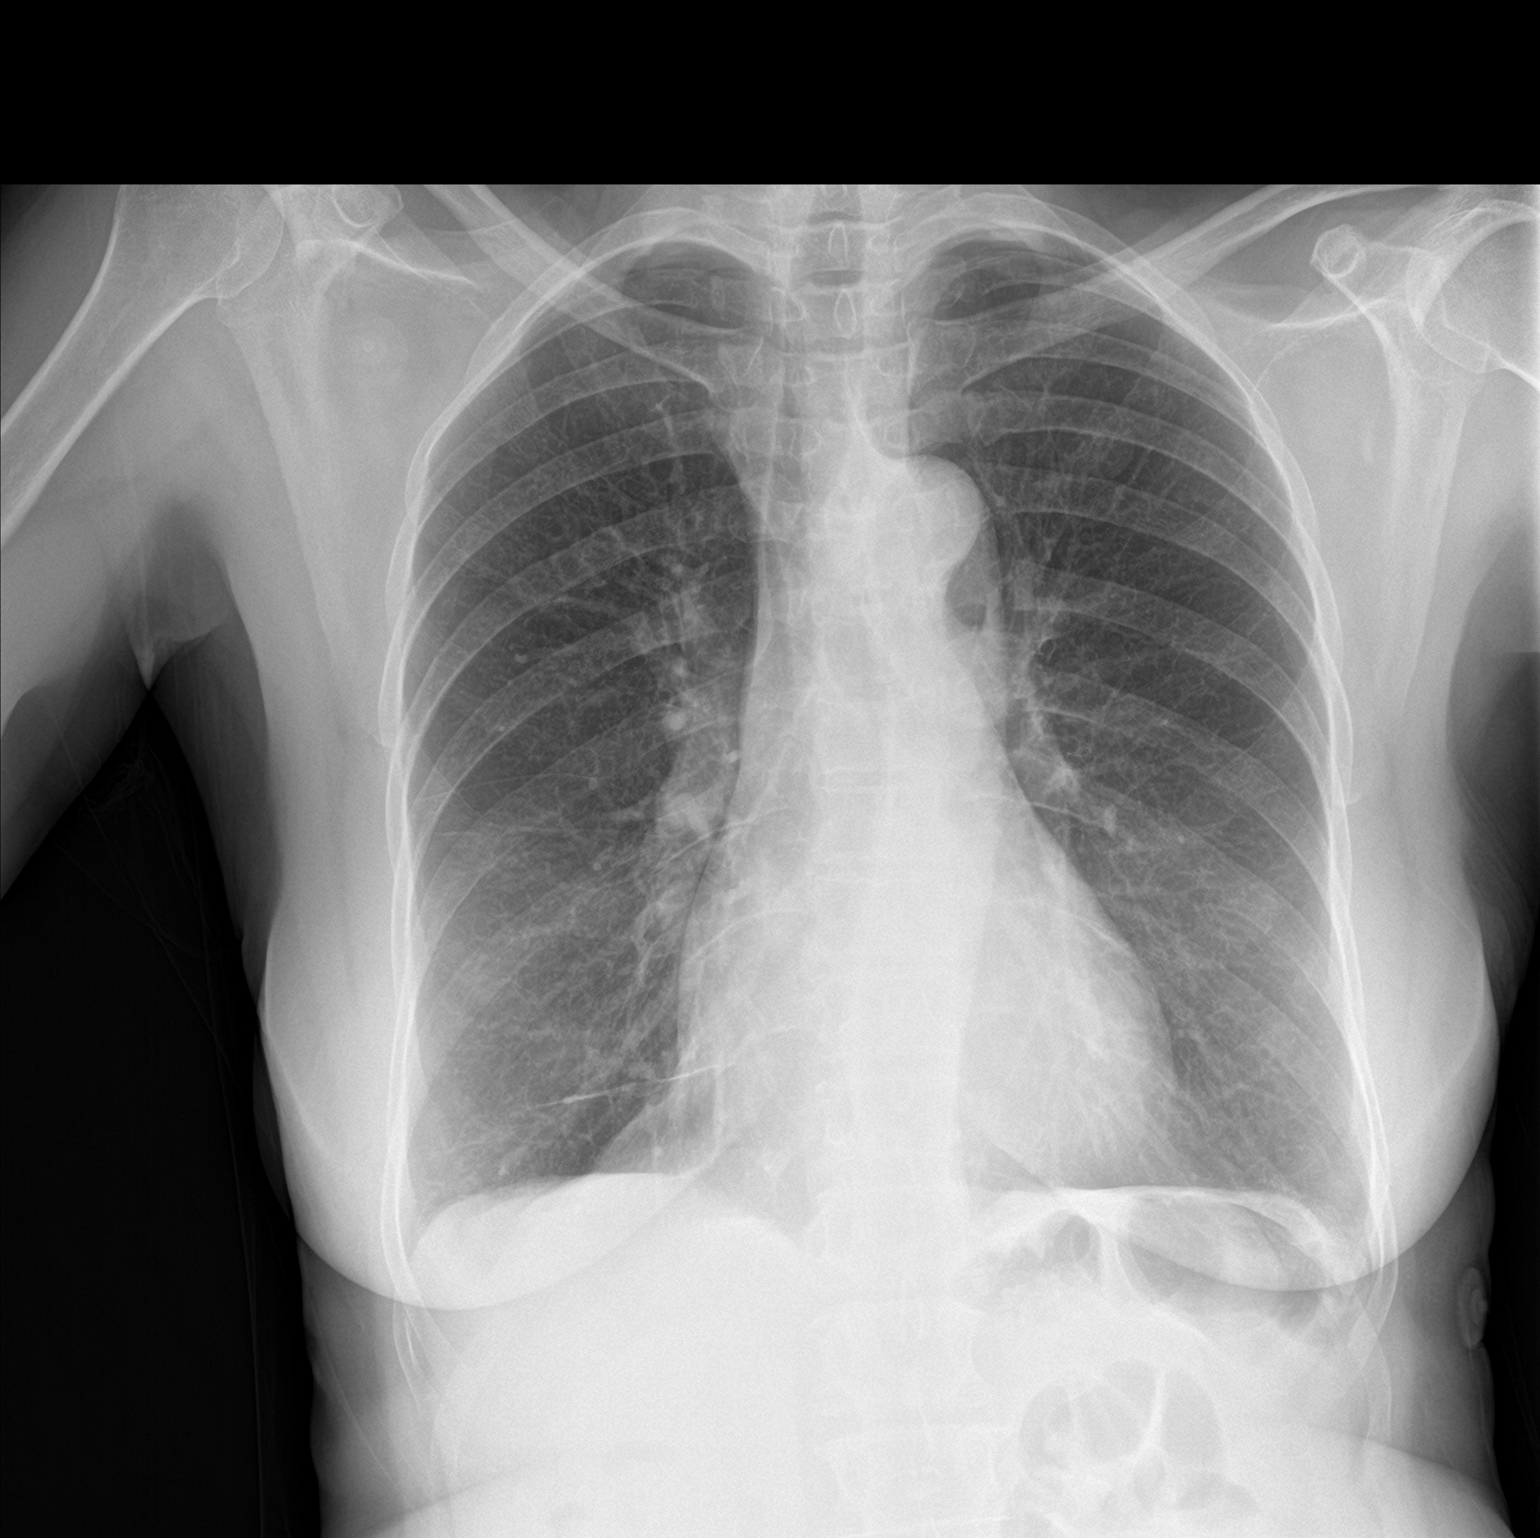

[chest lat]
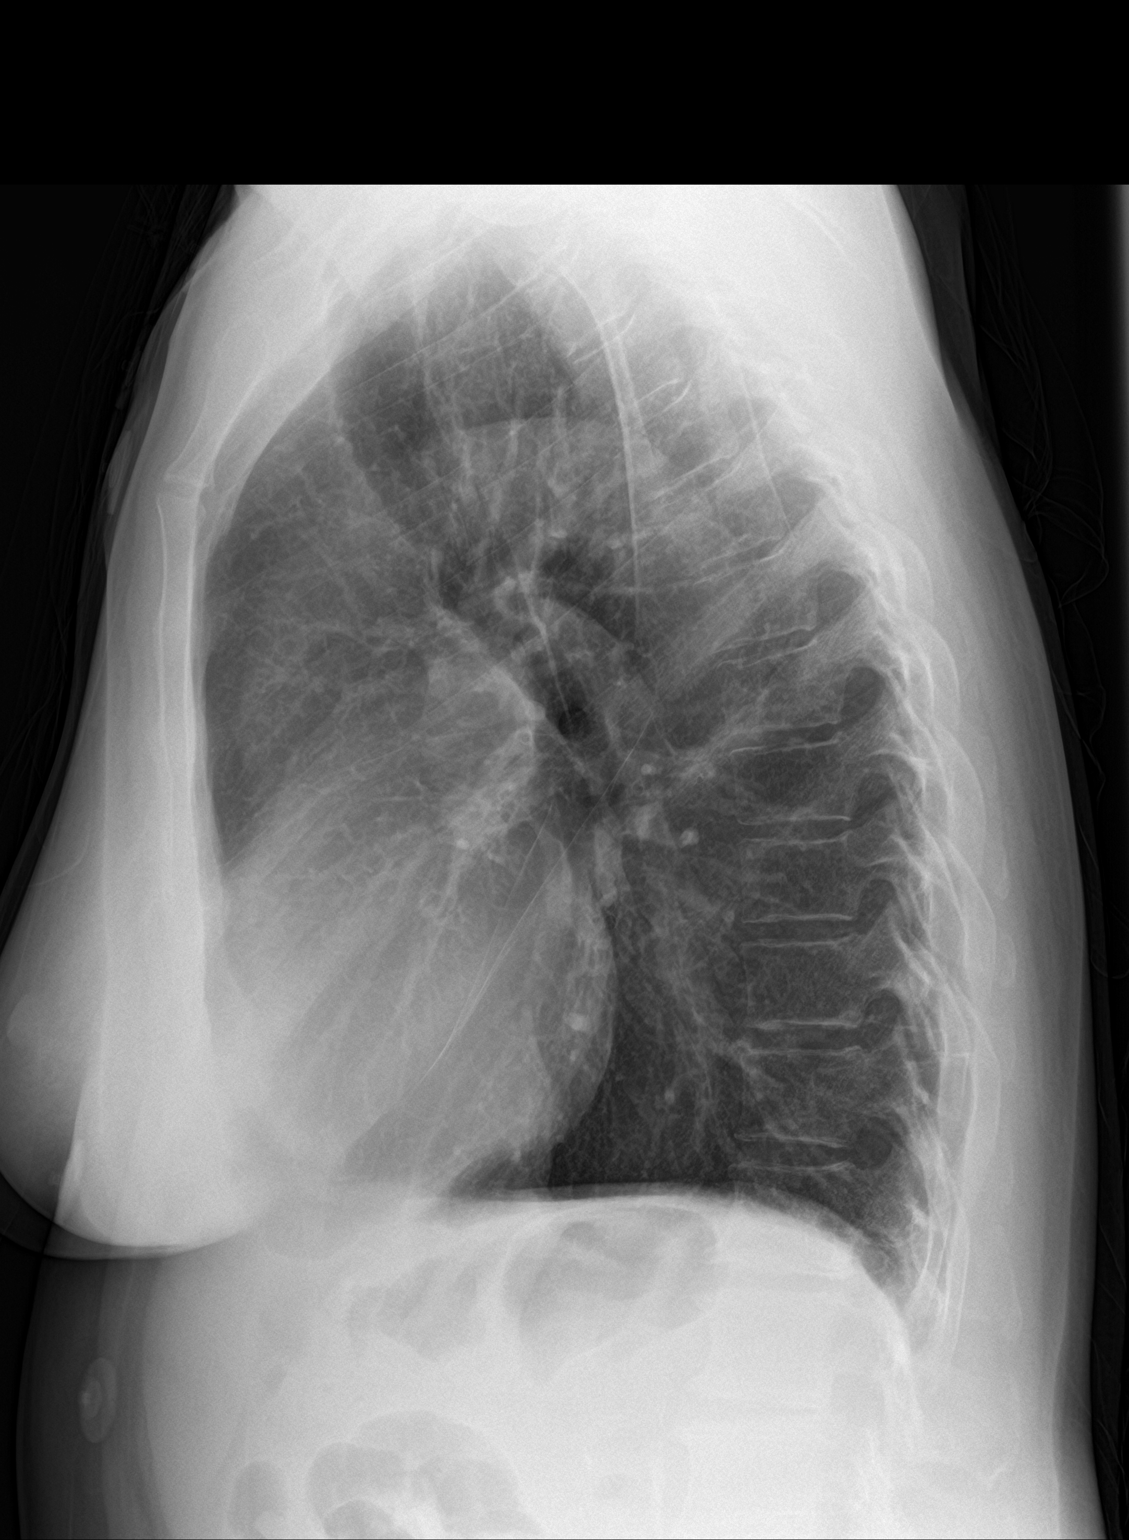

[2 of 2 positions shown; findings below may reference images not displayed]

FINDINGS: Mild cardiomegaly. Hyperaeration. Mild bibasilar atelectasis
improved. No pneumothorax.
IMPRESSION: Improved bibasilar atelectasis.

## 2018-11-27 ENCOUNTER — Other Ambulatory Visit: Payer: Self-pay

## 2018-11-27 ENCOUNTER — Emergency Department (HOSPITAL_COMMUNITY)
Admission: EM | Admit: 2018-11-27 | Discharge: 2018-11-27 | Disposition: A | Discharge status: Home / Self Care | Payer: Medicare Other | Attending: Emergency Medicine | Admitting: Emergency Medicine

## 2018-11-27 ENCOUNTER — Encounter (HOSPITAL_COMMUNITY): Payer: Self-pay | Admitting: *Deleted

## 2018-11-27 ENCOUNTER — Emergency Department (HOSPITAL_COMMUNITY): Payer: Medicare Other

## 2018-11-27 DIAGNOSIS — J45901 Unspecified asthma with (acute) exacerbation: Secondary | ICD-10-CM | POA: Insufficient documentation

## 2018-11-27 DIAGNOSIS — Z87891 Personal history of nicotine dependence: Secondary | ICD-10-CM | POA: Insufficient documentation

## 2018-11-27 DIAGNOSIS — I10 Essential (primary) hypertension: Secondary | ICD-10-CM | POA: Diagnosis not present

## 2018-11-27 DIAGNOSIS — Z79899 Other long term (current) drug therapy: Secondary | ICD-10-CM | POA: Insufficient documentation

## 2018-11-27 DIAGNOSIS — R1013 Epigastric pain: Secondary | ICD-10-CM | POA: Diagnosis not present

## 2018-11-27 DIAGNOSIS — K8020 Calculus of gallbladder without cholecystitis without obstruction: Secondary | ICD-10-CM | POA: Diagnosis not present

## 2018-11-27 DIAGNOSIS — R1011 Right upper quadrant pain: Secondary | ICD-10-CM | POA: Diagnosis not present

## 2018-11-27 LAB — URINALYSIS, ROUTINE W REFLEX MICROSCOPIC
Bilirubin Urine: NEGATIVE
Glucose, UA: NEGATIVE mg/dL
Ketones, ur: NEGATIVE mg/dL
Leukocytes,Ua: NEGATIVE
Nitrite: NEGATIVE
Protein, ur: NEGATIVE mg/dL
Specific Gravity, Urine: 1.012 (ref 1.005–1.030)
pH: 8 (ref 5.0–8.0)

## 2018-11-27 LAB — COMPREHENSIVE METABOLIC PANEL
ALT: 50 U/L — ABNORMAL HIGH (ref 0–44)
AST: 31 U/L (ref 15–41)
Albumin: 3.6 g/dL (ref 3.5–5.0)
Alkaline Phosphatase: 100 U/L (ref 38–126)
Anion gap: 10 (ref 5–15)
BUN: 9 mg/dL (ref 8–23)
CO2: 27 mmol/L (ref 22–32)
Calcium: 9.9 mg/dL (ref 8.9–10.3)
Chloride: 102 mmol/L (ref 98–111)
Creatinine, Ser: 0.81 mg/dL (ref 0.44–1.00)
GFR calc Af Amer: >60 mL/min (ref >60)
GFR calc non Af Amer: >60 mL/min (ref >60)
Glucose, Bld: 104 mg/dL — ABNORMAL HIGH (ref 70–99)
Potassium: 3.3 mmol/L — ABNORMAL LOW (ref 3.5–5.1)
Sodium: 139 mmol/L (ref 135–145)
Total Bilirubin: 0.3 mg/dL (ref 0.3–1.2)
Total Protein: 7.1 g/dL (ref 6.5–8.1)

## 2018-11-27 LAB — CBC WITH DIFFERENTIAL/PLATELET
Abs Immature Granulocytes: 0.01 10*3/uL (ref 0.00–0.07)
Basophils Absolute: 0 10*3/uL (ref 0.0–0.1)
Basophils Relative: 1 %
Eosinophils Absolute: 0.2 10*3/uL (ref 0.0–0.5)
Eosinophils Relative: 5 %
HCT: 38.9 % (ref 36.0–46.0)
Hemoglobin: 11.9 g/dL — ABNORMAL LOW (ref 12.0–15.0)
Immature Granulocytes: 0 %
Lymphocytes Relative: 24 %
Lymphs Abs: 0.9 10*3/uL (ref 0.7–4.0)
MCH: 25.5 pg — ABNORMAL LOW (ref 26.0–34.0)
MCHC: 30.6 g/dL (ref 30.0–36.0)
MCV: 83.5 fL (ref 80.0–100.0)
Monocytes Absolute: 0.4 10*3/uL (ref 0.1–1.0)
Monocytes Relative: 11 %
Neutro Abs: 2.1 10*3/uL (ref 1.7–7.7)
Neutrophils Relative %: 59 %
Platelets: 343 10*3/uL (ref 150–400)
RBC: 4.66 MIL/uL (ref 3.87–5.11)
RDW: 15.4 % (ref 11.5–15.5)
WBC: 3.6 10*3/uL — ABNORMAL LOW (ref 4.0–10.5)
nRBC: 0 % (ref 0.0–0.2)

## 2018-11-27 LAB — LIPASE, BLOOD: Lipase: 24 U/L (ref 11–51)

## 2018-11-27 MED ORDER — ONDANSETRON HCL 4 MG/2ML IJ SOLN
4.0000 mg | Freq: Once | INTRAMUSCULAR | Status: AC
  Administered 2018-11-27: 4 mg via INTRAVENOUS
  Filled 2018-11-27: qty 2

## 2018-11-27 MED ORDER — SODIUM CHLORIDE 0.9 % IV BOLUS
1000.0000 mL | Freq: Once | INTRAVENOUS | Status: AC
  Administered 2018-11-27: 1000 mL via INTRAVENOUS

## 2018-11-27 MED ORDER — MORPHINE SULFATE (PF) 4 MG/ML IV SOLN
4.0000 mg | Freq: Once | INTRAVENOUS | Status: AC
  Administered 2018-11-27: 4 mg via INTRAVENOUS
  Filled 2018-11-27: qty 1

## 2018-11-27 MED ORDER — HYDROCODONE-ACETAMINOPHEN 5-325 MG PO TABS: 2.0000 | ORAL_TABLET | Freq: Once | ORAL | Status: DC

## 2018-11-27 MED ORDER — HYDROCODONE-ACETAMINOPHEN 5-325 MG PO TABS
1.0000 | ORAL_TABLET | Freq: Once | ORAL | Status: AC
  Administered 2018-11-27: 1 via ORAL
  Filled 2018-11-27: qty 1

## 2018-11-27 MED ORDER — HYDROCODONE-ACETAMINOPHEN 5-325 MG PO TABS: 1.0000 | ORAL_TABLET | Freq: Four times a day (QID) | ORAL | 0 refills | Status: DC | PRN

## 2018-11-27 NOTE — Discharge Instructions (Addendum)
You have been diagnosed today with Gallstones.  At this time there does not appear to be the presence of an emergent medical condition, however there is always the potential for conditions to change. Please read and follow the below instructions.  Please return to the Emergency Department immediately for any new or worsening symptoms. Please be sure to follow up with your Primary Care Provider within one week regarding your visit today; please call their office to schedule an appointment even if you are feeling better for a follow-up visit. Please go to your appointment with your general surgeon tomorrow as we discussed for further evaluation and treatment of your abdominal pain and gallstones.  You may use the pain medication Norco as prescribed for severe pain.  Do not drive or operate machinery while taking this medication as it will make you drowsy.  Additionally do not take other sedating medications or drink alcohol with Norco.  Return to the emergency department immediately for any new/worsening or concerning symptoms.  Avoid large meals and greasy/fatty/fried foods as this may make your pain worse.  Get help right away if: You have sudden pain in the upper right side of your belly You have belly pain  You have a fever or chills. You keep feeling sick to your stomach or you keep throwing up. Your skin or the whites of your eyes turn yellow (jaundice). You have dark-colored pee (urine). You have light-colored poop (stool). Any new or concerning symptoms  Please read the additional information packets attached to your discharge summary.  Do not take your medicine if  develop an itchy rash, swelling in your mouth or lips, or difficulty breathing.

## 2018-11-27 NOTE — ED Triage Notes (Signed)
PT here for pain meds because she ran out of  Her pain meds. Pt reports RT flank pain

## 2018-11-27 NOTE — ED Provider Notes (Signed)
[facility] EMERGENCY DEPARTMENT Provider Note   CSN: [id number] Arrival date & time: 11/27/18  0751    History   Chief Complaint Chief Complaint  Patient presents with  . Abdominal Pain    HPI [name] is a 68 y.o. female presenting today for recurrent right upper quadrant pain.  Patient was seen here on 11/21/2018 at which time she was diagnosed with cholelithiasis.  She has been using Norco as previously prescribed however she has recently run out of her medication.  Patient reports that she had recurrence of right upper quadrant pain beginning last night she describes a severe in intensity constant pain that radiates from her right upper quadrant around towards her back, she endorses intermittent nausea with her pain without vomiting.  Patient denies any fever/chills, chest pain/shortness of breath, vomiting/diarrhea, dysuria/hematuria or any additional concerns today.  Patient reports that she is scheduled to see general surgery tomorrow for further evaluation of her cholelithiasis however she cannot wait for this appointment due to pain.     HPI  Past Medical History:  Diagnosis Date  . A-fib ([place])   . Allergy    seasonal  . Asthma   . Chronic anticoagulation   . Eczema   . GERD (gastroesophageal reflux disease)   . Hypertension   . PAF (paroxysmal atrial fibrillation) ([place]) 11/26/2015   April/May 2017: Multiple ED visits for A. Fib.  02/12/16: Improved on diltiazem IV. Cardiology consulted in the ED and discharge patient with diltiazem XR 120 mg once daily with 60 mg by mouth every 6 hours as needed for symptomatic palpitations, flecainide 50 mg twice daily. Referred to the atrial fibrillation clinic.  02/17/16: Seen in the atrial fibrillation clinic by NP Roderic Palau. Normal sin    Patient Active Problem List   Diagnosis Date Noted  . Vaginal discharge 10/11/2018  . Vaginal bleeding 10/11/2018  . History of radiofrequency ablation (RFA)  procedure for cardiac arrhythmia   . Asthma exacerbation 08/28/2018  . Chronic congestion of paranasal sinus 06/14/2018  . Primary osteoarthritis involving multiple joints 06/14/2018  . Acute pain of left knee 05/15/2018  . Hyperpigmented skin lesion 09/09/2017  . Osteoporosis without current pathological fracture 04/15/2017  . Normocytic anemia 10/19/2016  . Hyperlipidemia 03/18/2016  . Special screening for malignant neoplasms, colon 03/17/2016  . Cervical cancer screening 03/17/2016  . Assistance with transportation 03/17/2016  . HTN (hypertension), benign   . GERD (gastroesophageal reflux disease)   . PAF (paroxysmal atrial fibrillation) ([facility]) 11/26/2015  . Chronic anticoagulation 11/26/2015  . Marijuana abuse 06/11/2015    Past Surgical History:  Procedure Laterality Date  . ATRIAL FIBRILLATION ABLATION N/A 09/30/2017   Procedure: ATRIAL FIBRILLATION ABLATION;  Surgeon: Thompson Grayer, MD;  Location: [facility];  Service: Cardiovascular;  Laterality: N/A;  . TONSILLECTOMY    . TUBAL LIGATION       OB History   No obstetric history on file.      Home Medications    Prior to Admission medications   Medication Sig Start Date End Date Taking? Authorizing Provider  acetaminophen (TYLENOL) 500 MG tablet Take 1,000 mg by mouth 2 (two) times daily as needed for headache (pain).    [provider]  benzonatate (TESSALON) 100 MG capsule Take 100 mg by mouth 3 (three) times daily as needed for cough.    [provider]  diltiazem (CARDIZEM) 60 MG tablet Take 60 mg by mouth every 6 (six) hours as needed (Rapid AFIB  over 100).    [provider]  diltiazem (CARTIA XT) 120 MG 24 hr capsule Take 1 capsule (120 mg total) by mouth 2 (two) times daily. 10/23/18   Sherran Needs, NP  fluconazole (DIFLUCAN) 150 MG tablet Take 1 tablet (150 mg total) by mouth daily. 10/11/18   Carroll Sage, MD  fluticasone (FLOVENT HFA) 44 MCG/ACT inhaler Inhale 1 puff into  the lungs 2 (two) times daily.    [provider]  HYDROcodone-acetaminophen (NORCO/VICODIN) 5-325 MG tablet Take 1-2 tablets by mouth every 6 (six) hours as needed for severe pain. 11/27/18   Nuala Alpha A, PA-C  levalbuterol (XOPENEX HFA) 45 MCG/ACT inhaler Inhale 1 puff into the lungs every 6 (six) hours as needed for shortness of breath. 11/21/17   Carmin Muskrat, MD  linaclotide [facility]) 145 MCG CAPS capsule Take 1 capsule (145 mcg total) by mouth daily before breakfast. 11/08/18   [name], MD  losartan (COZAAR) 50 MG tablet TAKE 1 TABLET(50 MG) BY MOUTH DAILY Patient taking differently: Take 50 mg by mouth daily.  09/04/18   [name], MD  magnesium hydroxide (MILK OF MAGNESIA) 400 MG/5ML suspension Take 15-30 mLs by mouth at bedtime as needed for mild constipation. 11/01/17   Harris, Vernie Shanks, PA-C  metroNIDAZOLE (FLAGYL) 500 MG tablet Take 1 tablet (500 mg total) by mouth 2 (two) times daily. 10/11/18   Carroll Sage, MD  montelukast (SINGULAIR) 10 MG tablet Take 1 tablet (10 mg total) by mouth at bedtime. 05/16/18   [name], MD  Multiple Vitamin (MULTIVITAMIN WITH MINERALS) TABS tablet Take 1 tablet by mouth every other day.    [provider]  ondansetron (ZOFRAN ODT) 4 MG disintegrating tablet Take 1 tablet (4 mg total) by mouth every 8 (eight) hours as needed for nausea or vomiting. 11/21/18   Duffy Bruce, MD  potassium chloride SA (K-DUR,KLOR-CON) 20 MEQ tablet Take 1 tablet (20 mEq total) by mouth 2 (two) times daily for 3 days. 11/21/18 11/24/18  Duffy Bruce, MD  rivaroxaban (XARELTO) 20 MG TABS tablet Take 1 tablet (20 mg total) by mouth daily with supper. 08/31/18   Sherran Needs, NP  sucralfate (CARAFATE) 1 g tablet Take 1 tablet (1 g total) by mouth 4 (four) times daily. 10/16/18 10/16/19  Oval Linsey, MD  tiotropium (SPIRIVA) 18 MCG inhalation capsule Place 1 capsule (18 mcg total) into inhaler and inhale daily. 05/26/18   [name], MD   triamcinolone cream (KENALOG) 0.1 % Apply 1 application topically 2 (two) times daily. Patient taking differently: Apply 1 application topically 2 (two) times daily as needed (rash/itching).  05/06/18   Henderly, Britni A, PA-C    Family History Family History  Problem Relation Age of Onset  . Heart attack Mother        [name] age  . Breast cancer Sister        Late 36s; now s/p mastectomy   . Lung cancer Brother        x 2  . Cancer Sister   . Cancer Sister   . Cancer Sister   . Colon cancer Neg Hx   . Colon polyps Neg Hx   . Esophageal cancer Neg Hx   . Rectal cancer Neg Hx   . Stomach cancer Neg Hx     Social History Social History   Tobacco Use  . Smoking status: Former Smoker    Last attempt to quit: 10/24/2015    Years since quitting: 3.0  .  Smokeless tobacco: Never Used  Substance Use Topics  . Alcohol use: No  . Drug use: Not Currently    Types: Marijuana    Comment: Smoked marijuana in the past.     Allergies   Albuterol; Celecoxib; and Protonix [pantoprazole sodium]   Review of Systems Review of Systems  Constitutional: Negative.  Negative for chills and fever.  Respiratory: Negative.  Negative for cough and shortness of breath.   Cardiovascular: Negative.  Negative for chest pain.  Gastrointestinal: Positive for abdominal pain and nausea. Negative for blood in stool, diarrhea and vomiting.  Genitourinary: Negative.  Negative for dysuria and hematuria.  Neurological: Negative.  Negative for weakness and headaches.  All other systems reviewed and are negative.  Physical Exam Updated Vital Signs BP (!) 148/92 (BP Location: Right Arm)   Pulse 86   Temp 98.5 F (36.9 C) (Oral)   Resp 16   Ht 5\' 7"  (1.702 m)   Wt 69.9 kg   SpO2 95%   BMI 24.12 kg/m   Physical Exam Constitutional:      General: She is not in acute distress.    Appearance: Normal appearance. She is well-developed. She is not ill-appearing or diaphoretic.  HENT:     Head:  Normocephalic and atraumatic.     Right Ear: External ear normal.     Left Ear: External ear normal.     Nose: Nose normal.  Eyes:     General: Vision grossly intact. Gaze aligned appropriately.     Pupils: Pupils are equal, round, and reactive to light.  Neck:     Musculoskeletal: Normal range of motion.     Trachea: Trachea and phonation normal. No tracheal deviation.  Pulmonary:     Effort: Pulmonary effort is normal. No respiratory distress.  Abdominal:     General: Bowel sounds are normal. There is no distension.     Palpations: Abdomen is soft.     Tenderness: There is abdominal tenderness in the right upper quadrant and epigastric area. There is no right CVA tenderness, left CVA tenderness, guarding or rebound. Positive signs include Murphy's sign. Negative signs include Rovsing's sign and McBurney's sign.    Genitourinary:    Comments: Deferred by patient. Musculoskeletal: Normal range of motion.  Skin:    General: Skin is warm and dry.  Neurological:     Mental Status: She is alert.     GCS: GCS eye subscore is 4. GCS verbal subscore is 5. GCS motor subscore is 6.     Comments: Speech is clear and goal oriented, follows commands Major Cranial nerves without deficit, no facial droop Moves extremities without ataxia, coordination intact Normal gait  Psychiatric:        Behavior: Behavior normal.    ED Treatments / Results  Labs (all labs ordered are listed, but only abnormal results are displayed) Labs Reviewed  CBC WITH DIFFERENTIAL/PLATELET - Abnormal; Notable for the following components:      Result Value   WBC 3.6 (*)    Hemoglobin 11.9 (*)    MCH 25.5 (*)    All other components within normal limits  COMPREHENSIVE METABOLIC PANEL - Abnormal; Notable for the following components:   Potassium 3.3 (*)    Glucose, Bld 104 (*)    ALT 50 (*)    All other components within normal limits  URINALYSIS, ROUTINE W REFLEX MICROSCOPIC - Abnormal; Notable for the  following components:   APPearance CLOUDY (*)    Hgb urine dipstick SMALL (*)  Bacteria, UA RARE (*)    All other components within normal limits  LIPASE, BLOOD    EKG None  Radiology US Abdomen Complete  Result Date: 11/27/2018 CLINICAL DATA:  Right upper quadrant pain.  Known cholelithiasis. EXAM: ABDOMEN ULTRASOUND COMPLETE COMPARISON:  Ultrasound dated 11/21/2018 and CT scan dated 10/14/2018 FINDINGS: Gallbladder: There are multiple gallstones. Negative sonographic Murphy's sign. Wall thickness is normal at 3 mm. Common bile duct: Diameter: 5.3 mm, normal. Liver: Multiple simple up attic cysts as demonstrated on prior exams. Otherwise normal. Portal vein is patent on color Doppler imaging with normal direction of blood flow towards the liver. IVC: No abnormality visualized. Pancreas: Visualized portion unremarkable. Spleen: Size and appearance within normal limits. Right Kidney: Length: 10.9 cm. Echogenicity within normal limits. No mass or hydronephrosis visualized. Left Kidney: Length: 11.8 cm. Echogenicity within normal limits. No mass or hydronephrosis visualized. Abdominal aorta: No aneurysm visualized. Other findings: None. IMPRESSION: Extensive cholelithiasis.  No change since the prior exam. Electronically Signed   By: Lorriane Shire M.D.   On: 11/27/2018 10:54    Procedures Procedures (including critical care time)  Medications Ordered in ED Medications  morphine 4 MG/ML injection 4 mg (4 mg Intravenous Given 11/27/18 0913)  sodium chloride 0.9 % bolus 1,000 mL (0 mLs Intravenous Stopped 11/27/18 1251)  ondansetron (ZOFRAN) injection 4 mg (4 mg Intravenous Given 11/27/18 0913)  HYDROcodone-acetaminophen (NORCO/VICODIN) 5-325 MG per tablet 1 tablet (1 tablet Oral Given 11/27/18 1250)     Initial Impression / Assessment and Plan / ED Course  I have reviewed the triage vital signs and the nursing notes.  Pertinent labs & imaging results that were available during my care of the  patient were reviewed by me and considered in my medical decision making (see chart for details).    Lipase within normal limits CBC unremarkable CMP unremarkable Urinalysis unremarkable Ultrasound abdomen:  IMPRESSION:  Extensive cholelithiasis. No change since the prior exam.  ================= Patient reassessed pain improved following morphine and Zofran today.  Patient seen and evaluated by Dr. Ellender Hose.  Likely patient can be discharged at this time however we will consult general surgery they would like to see the patient today instead of tomorrow. --- Discussed with on-call general surgery, they recommend discharge with follow-up in their clinic tomorrow. --- Case rediscussed with Dr. Ellender Hose.  Will give patient pain control, encourage general surgery follow-up tomorrow.  Discharge at this time. --- Reevaluated resting comfortably no acute distress.  Tolerating p.o. here in the emergency department without difficulty.  No nausea vomiting or abdominal pain.  Reexamination of the abdomen reveals a soft/nondistended, nontender abdomen without peritoneal signs.  Suspect patient with symptomatic cholelithiasis today, do not suspect acute intra-abdominal etiology of her symptoms requiring further work-up at this time.  Patient to be prescribed Norco for severe pain, given precautions regarding narcotics.  PMP shows patient did receive Norco on 11/22/2018, 15 pills she is now out of this prescription feel that it is reasonable to give 6 pills of Norco at this time prior to her general surgery follow-up tomorrow.  Additionally discussed cholelithiasis with the patient and educated her on food choices to avoid exacerbation of her symptoms.  Patient states understanding of plan of care and return precautions and to follow-up with general surgery tomorrow.  Patient with family member to drive her home today.  At this time there does not appear to be any evidence of an acute emergency medical condition  and the patient appears stable for discharge  with appropriate outpatient follow up. Diagnosis was discussed with patient who verbalizes understanding of care plan and is agreeable to discharge. I have discussed return precautions with patient who verbalizes understanding of return precautions. Patient encouraged to follow-up with their PCP and general surgery. All questions answered.  Patient has been discharged in good condition.  Note: Portions of this report may have been transcribed using voice recognition software. Every effort was made to ensure accuracy; however, inadvertent computerized transcription errors may still be present. Final Clinical Impressions(s) / ED Diagnoses   Final diagnoses:  Symptomatic cholelithiasis    ED Discharge Orders         Ordered    HYDROcodone-acetaminophen (NORCO/VICODIN) 5-325 MG tablet  Every 6 hours PRN     11/27/18 1310           Deliah Boston, PA-C 11/27/18 1345    Duffy Bruce, MD 11/27/18 1949

## 2018-11-27 NOTE — ED Notes (Signed)
Patient verbalizes understanding of discharge instructions . Opportunity for questions and answers were provided . Armband removed by staff ,Pt discharged from ED. W/C  offered at D/C  and Declined W/C at D/C and was escorted to lobby by RN.  

## 2018-11-28 ENCOUNTER — Ambulatory Visit: Payer: Self-pay | Admitting: General Surgery

## 2018-11-28 DIAGNOSIS — K8020 Calculus of gallbladder without cholecystitis without obstruction: Secondary | ICD-10-CM | POA: Diagnosis not present

## 2018-11-29 ENCOUNTER — Telehealth: Payer: Self-pay

## 2018-11-29 ENCOUNTER — Other Ambulatory Visit: Payer: Self-pay | Admitting: Internal Medicine

## 2018-11-29 NOTE — Telephone Encounter (Signed)
Requesting to speak with a nurse about meds. Please call back.  

## 2018-11-29 NOTE — Telephone Encounter (Signed)
HYDROcodone-acetaminophen (NORCO/VICODIN) 5-325 MG tablet, refill request @  [facility], [address] AT [address] [phone] (Phone) [phone] (Fax)

## 2018-11-29 NOTE — Telephone Encounter (Signed)
Pls contact pt [phone]   Needs refill on inhaler and pain medicine for the gall stones;     [facility], [address] AT [address]

## 2018-11-30 ENCOUNTER — Telehealth: Payer: Self-pay | Admitting: *Deleted

## 2018-11-30 NOTE — Telephone Encounter (Signed)
The patient will need to contact her GI doctor, GI surgeon or PCP for pain medication  Thank you  Kathyrn Drown NP-C [facility] Pager: [phone]

## 2018-11-30 NOTE — Telephone Encounter (Signed)
   Primary Cardiologist: Dr. Thompson Grayer, MD   Chart reviewed as part of pre-operative protocol coverage. Given past medical history and time since last visit, based on ACC/AHA guidelines, [name] would be at acceptable risk for the planned procedure without further cardiovascular testing.   Per pharmacy: Patient with diagnosis of AFIB on XARELTO for anticoagulation.    Procedure: GALLBLADDER REMOVAL Date of procedure: TBD  CHADS2-VASc score of  3 (HTN, AGE, female)  CrCl =72 ML/MIN  Per office protocol, patient can hold XARELTO for 2days prior to procedure.     I will route this recommendation to the requesting party via Epic fax function and remove from pre-op pool.  Please call with questions.  Kathyrn Drown, NP 11/30/2018, 2:15 PM

## 2018-11-30 NOTE — Telephone Encounter (Signed)
   [facility] Pre-operative Risk Assessment    Request for surgical clearance:  1. What type of surgery is being performed? Gallbladder Removal Surgery  2. When is this surgery scheduled? Pending Clearance   3. What type of clearance is required (medical clearance vs. Pharmacy clearance to hold med vs. Both)? Both  4. Are there any medications that need to be held prior to surgery and how long? Xarelto, 2 days prior to surgery   5. Practice name and name of physician performing surgery? [facility], Dr. Autumn Messing  6. What is your office phone number [phone]   7.   What is your office fax number [phone]  8.   Anesthesia type (None, local, MAC, general) ? General   [name] 11/30/2018, 12:31 PM  _________________________________________________________________   (provider comments below)

## 2018-11-30 NOTE — Telephone Encounter (Signed)
Pt is calling it a lot of pain, she is having gall stones and she want some pain medicine; pls contact [phone]

## 2018-11-30 NOTE — Telephone Encounter (Signed)
Patient with diagnosis of AFIB on XARELTO for anticoagulation.    Procedure: GALLBLADDER REMOVAL Date of procedure: TBD  CHADS2-VASc score of  3 (HTN, AGE, female)  CrCl =72 ML/MIN  Per office protocol, patient can hold XARELTO for 2days prior to procedure.

## 2018-11-30 NOTE — Telephone Encounter (Signed)
Requesting pain meds to be filled by today. Pt states she is in pain and she got gallstone. Please call back.

## 2018-12-01 ENCOUNTER — Encounter (HOSPITAL_COMMUNITY): Payer: Self-pay | Admitting: Emergency Medicine

## 2018-12-01 ENCOUNTER — Other Ambulatory Visit: Payer: Self-pay

## 2018-12-01 ENCOUNTER — Emergency Department (HOSPITAL_COMMUNITY)
Admission: EM | Admit: 2018-12-01 | Discharge: 2018-12-01 | Disposition: A | Discharge status: Home / Self Care | Payer: Medicare Other | Attending: Emergency Medicine | Admitting: Emergency Medicine

## 2018-12-01 DIAGNOSIS — Z8719 Personal history of other diseases of the digestive system: Secondary | ICD-10-CM | POA: Diagnosis not present

## 2018-12-01 DIAGNOSIS — K8020 Calculus of gallbladder without cholecystitis without obstruction: Secondary | ICD-10-CM | POA: Diagnosis not present

## 2018-12-01 DIAGNOSIS — R1011 Right upper quadrant pain: Secondary | ICD-10-CM | POA: Diagnosis present

## 2018-12-01 DIAGNOSIS — Z79899 Other long term (current) drug therapy: Secondary | ICD-10-CM | POA: Diagnosis not present

## 2018-12-01 DIAGNOSIS — Z7901 Long term (current) use of anticoagulants: Secondary | ICD-10-CM | POA: Diagnosis not present

## 2018-12-01 DIAGNOSIS — Z760 Encounter for issue of repeat prescription: Secondary | ICD-10-CM | POA: Diagnosis not present

## 2018-12-01 DIAGNOSIS — I10 Essential (primary) hypertension: Secondary | ICD-10-CM | POA: Insufficient documentation

## 2018-12-01 DIAGNOSIS — Z87891 Personal history of nicotine dependence: Secondary | ICD-10-CM | POA: Insufficient documentation

## 2018-12-01 MED ORDER — OXYCODONE-ACETAMINOPHEN 5-325 MG PO TABS
2.0000 | ORAL_TABLET | Freq: Once | ORAL | Status: AC
  Administered 2018-12-01: 2 via ORAL
  Filled 2018-12-01: qty 2

## 2018-12-01 MED ORDER — OXYCODONE-ACETAMINOPHEN 5-325 MG PO TABS: 1.0000 | ORAL_TABLET | Freq: Four times a day (QID) | ORAL | 0 refills | Status: DC | PRN

## 2018-12-01 NOTE — ED Provider Notes (Signed)
[facility] EMERGENCY DEPARTMENT Provider Note   CSN: [id number] Arrival date & time: 12/01/18  1407    History   Chief Complaint Chief Complaint  Patient presents with  . Cholelithiasis  . Medication Refill    HPI Shelby UNCAPHER is a 68 y.o. female.   HPI   38yF with abdominal pain. Known cholelithiasis. Recent evaluations for the same. Says she has been to see a Psychologist, sport and exercise already and "trying to figure out a time" to get her GB taken out. Ran out of pain medications. Called surgeon and PCP but couldn't get through so came to the ED. Pain is the same pain she has been having. No acute change. Nausea. No vomiting. No fever or chills.   Past Medical History:  Diagnosis Date  . A-fib ([place])   . Allergy    seasonal  . Asthma   . Chronic anticoagulation   . Eczema   . GERD (gastroesophageal reflux disease)   . Hypertension   . PAF (paroxysmal atrial fibrillation) ([place]) 11/26/2015   April/May 2017: Multiple ED visits for A. Fib.  02/12/16: Improved on diltiazem IV. Cardiology consulted in the ED and discharge patient with diltiazem XR 120 mg once daily with 60 mg by mouth every 6 hours as needed for symptomatic palpitations, flecainide 50 mg twice daily. Referred to the atrial fibrillation clinic.  02/17/16: Seen in the atrial fibrillation clinic by NP Roderic Palau. Normal sin    Patient Active Problem List   Diagnosis Date Noted  . Vaginal discharge 10/11/2018  . Vaginal bleeding 10/11/2018  . History of radiofrequency ablation (RFA) procedure for cardiac arrhythmia   . Asthma exacerbation 08/28/2018  . Chronic congestion of paranasal sinus 06/14/2018  . Primary osteoarthritis involving multiple joints 06/14/2018  . Acute pain of left knee 05/15/2018  . Hyperpigmented skin lesion 09/09/2017  . Osteoporosis without current pathological fracture 04/15/2017  . Normocytic anemia 10/19/2016  . Hyperlipidemia 03/18/2016  . Special screening for malignant neoplasms,  colon 03/17/2016  . Cervical cancer screening 03/17/2016  . Assistance with transportation 03/17/2016  . HTN (hypertension), benign   . GERD (gastroesophageal reflux disease)   . PAF (paroxysmal atrial fibrillation) ([employer]) 11/26/2015  . Chronic anticoagulation 11/26/2015  . Marijuana abuse 06/11/2015    Past Surgical History:  Procedure Laterality Date  . ATRIAL FIBRILLATION ABLATION N/A 09/30/2017   Procedure: ATRIAL FIBRILLATION ABLATION;  Surgeon: Thompson Grayer, MD;  Location: [facility];  Service: Cardiovascular;  Laterality: N/A;  . TONSILLECTOMY    . TUBAL LIGATION       OB History   No obstetric history on file.      Home Medications    Prior to Admission medications   Medication Sig Start Date End Date Taking? Authorizing Provider  acetaminophen (TYLENOL) 500 MG tablet Take 1,000 mg by mouth 2 (two) times daily as needed for headache (pain).    [provider]  benzonatate (TESSALON) 100 MG capsule Take 100 mg by mouth 3 (three) times daily as needed for cough.    [provider]  diltiazem (CARDIZEM) 60 MG tablet Take 60 mg by mouth every 6 (six) hours as needed (Rapid AFIB over 100).    [provider]  diltiazem (CARTIA XT) 120 MG 24 hr capsule Take 1 capsule (120 mg total) by mouth 2 (two) times daily. 10/23/18   Sherran Needs, NP  fluconazole (DIFLUCAN) 150 MG tablet Take 1 tablet (150 mg total) by mouth daily. 10/11/18  Carroll Sage, MD  fluticasone (FLOVENT HFA) 44 MCG/ACT inhaler Inhale 1 puff into the lungs 2 (two) times daily.    [provider]  HYDROcodone-acetaminophen (NORCO/VICODIN) 5-325 MG tablet Take 1-2 tablets by mouth every 6 (six) hours as needed for severe pain. 11/27/18   Nuala Alpha A, PA-C  levalbuterol (XOPENEX HFA) 45 MCG/ACT inhaler INHALE 1 PUFF INTO LUNGS EVERY 6 HOURS AS NEEDED FOR SHORTNESS OF BREATH 11/29/18   [name], MD  linaclotide [facility]) 145 MCG CAPS capsule Take 1 capsule (145 mcg  total) by mouth daily before breakfast. 11/08/18   [name], MD  losartan (COZAAR) 50 MG tablet TAKE 1 TABLET(50 MG) BY MOUTH DAILY Patient taking differently: Take 50 mg by mouth daily.  09/04/18   [name], MD  magnesium hydroxide (MILK OF MAGNESIA) 400 MG/5ML suspension Take 15-30 mLs by mouth at bedtime as needed for mild constipation. 11/01/17   Harris, Vernie Shanks, PA-C  metroNIDAZOLE (FLAGYL) 500 MG tablet Take 1 tablet (500 mg total) by mouth 2 (two) times daily. 10/11/18   Carroll Sage, MD  montelukast (SINGULAIR) 10 MG tablet Take 1 tablet (10 mg total) by mouth at bedtime. 05/16/18   [name], MD  Multiple Vitamin (MULTIVITAMIN WITH MINERALS) TABS tablet Take 1 tablet by mouth every other day.    [provider]  ondansetron (ZOFRAN ODT) 4 MG disintegrating tablet Take 1 tablet (4 mg total) by mouth every 8 (eight) hours as needed for nausea or vomiting. 11/21/18   Duffy Bruce, MD  potassium chloride SA (K-DUR,KLOR-CON) 20 MEQ tablet Take 1 tablet (20 mEq total) by mouth 2 (two) times daily for 3 days. 11/21/18 11/24/18  Duffy Bruce, MD  rivaroxaban (XARELTO) 20 MG TABS tablet Take 1 tablet (20 mg total) by mouth daily with supper. 08/31/18   Sherran Needs, NP  sucralfate (CARAFATE) 1 g tablet Take 1 tablet (1 g total) by mouth 4 (four) times daily. 10/16/18 10/16/19  Oval Linsey, MD  tiotropium (SPIRIVA) 18 MCG inhalation capsule Place 1 capsule (18 mcg total) into inhaler and inhale daily. 05/26/18   [name], MD  triamcinolone cream (KENALOG) 0.1 % Apply 1 application topically 2 (two) times daily. Patient taking differently: Apply 1 application topically 2 (two) times daily as needed (rash/itching).  05/06/18   Henderly, Britni A, PA-C    Family History Family History  Problem Relation Age of Onset  . Heart attack Mother        [name] age  . Breast cancer Sister        Late 65s; now s/p mastectomy   . Lung cancer Brother        x 2  . Cancer Sister    . Cancer Sister   . Cancer Sister   . Colon cancer Neg Hx   . Colon polyps Neg Hx   . Esophageal cancer Neg Hx   . Rectal cancer Neg Hx   . Stomach cancer Neg Hx     Social History Social History   Tobacco Use  . Smoking status: Former Smoker    Last attempt to quit: 10/24/2015    Years since quitting: 3.1  . Smokeless tobacco: Never Used  Substance Use Topics  . Alcohol use: No  . Drug use: Not Currently    Types: Marijuana    Comment: Smoked marijuana in the past.     Allergies   Albuterol; Celecoxib; and Protonix [pantoprazole sodium]   Review of Systems Review of Systems All  systems reviewed and negative, other than as noted in HPI.   Physical Exam Updated Vital Signs BP (!) 159/88 (BP Location: Left Arm)   Pulse 85   Temp 98 F (36.7 C) (Oral)   Resp 16   Ht 5\' 7"  (1.702 m)   Wt 69 kg   SpO2 98%   BMI 23.82 kg/m   Physical Exam Vitals signs and nursing note reviewed.  Constitutional:      General: She is not in acute distress.    Appearance: She is well-developed.  HENT:     Head: Normocephalic and atraumatic.  Eyes:     General:        Right eye: No discharge.        Left eye: No discharge.     Conjunctiva/sclera: Conjunctivae normal.  Neck:     Musculoskeletal: Neck supple.  Cardiovascular:     Rate and Rhythm: Normal rate and regular rhythm.     Heart sounds: Normal heart sounds. No murmur. No friction rub. No gallop.   Pulmonary:     Effort: Pulmonary effort is normal. No respiratory distress.     Breath sounds: Normal breath sounds.  Abdominal:     General: There is no distension.     Palpations: Abdomen is soft.     Tenderness: There is abdominal tenderness.     Comments: TTP epigastrium and RUQ w/o rebound or guarding  Musculoskeletal:        General: No tenderness.  Skin:    General: Skin is warm and dry.  Neurological:     Mental Status: She is alert.  Psychiatric:        Behavior: Behavior normal.        Thought Content:  Thought content normal.      ED Treatments / Results  Labs (all labs ordered are listed, but only abnormal results are displayed) Labs Reviewed - No data to display  EKG None  Radiology No results found.  Procedures Procedures (including critical care time)  Medications Ordered in ED Medications - No data to display   Initial Impression / Assessment and Plan / ED Course  I have reviewed the triage vital signs and the nursing notes.  Pertinent labs & imaging results that were available during my care of the patient were reviewed by me and considered in my medical decision making (see chart for details).        67yF with upper abdominal pain. Known cholelithiasis. Described symptoms consistent with this. She denies acute change. Afebrile. Nontoxic. HD stable. Provided with prescription for meds given the current COVID climate. Explained that it is no longer appropriate to come to ED solely for this. Emergent precautions discussed. Needs to make arrangements with surgeon or PCP if requires additional meds after this.   Final Clinical Impressions(s) / ED Diagnoses   Final diagnoses:  Calculus of gallbladder without cholecystitis without obstruction  Medication refill    ED Discharge Orders    None       Virgel Manifold, MD 12/02/18 1345

## 2018-12-01 NOTE — Discharge Instructions (Addendum)
I understand your situation, but the ER is not the place to come for medication refills. You were provided another prescription today but you should not keep coming to the ER for this. You need to make arrangements with either your PCP or surgeon for further pain medication.

## 2018-12-01 NOTE — ED Triage Notes (Signed)
Pt arrives from home complaining of gall stones. Pt states she is in pain but cannot get her pain medication.

## 2018-12-02 ENCOUNTER — Encounter (HOSPITAL_COMMUNITY): Payer: Self-pay | Admitting: Emergency Medicine

## 2018-12-02 ENCOUNTER — Emergency Department (HOSPITAL_COMMUNITY)
Admission: EM | Admit: 2018-12-02 | Discharge: 2018-12-02 | Disposition: A | Discharge status: Home / Self Care | Payer: Medicare Other | Attending: Emergency Medicine | Admitting: Emergency Medicine

## 2018-12-02 ENCOUNTER — Other Ambulatory Visit: Payer: Self-pay

## 2018-12-02 DIAGNOSIS — I10 Essential (primary) hypertension: Secondary | ICD-10-CM | POA: Insufficient documentation

## 2018-12-02 DIAGNOSIS — J45901 Unspecified asthma with (acute) exacerbation: Secondary | ICD-10-CM | POA: Diagnosis not present

## 2018-12-02 DIAGNOSIS — Z79899 Other long term (current) drug therapy: Secondary | ICD-10-CM | POA: Insufficient documentation

## 2018-12-02 DIAGNOSIS — J45909 Unspecified asthma, uncomplicated: Secondary | ICD-10-CM | POA: Insufficient documentation

## 2018-12-02 DIAGNOSIS — Z87891 Personal history of nicotine dependence: Secondary | ICD-10-CM | POA: Diagnosis not present

## 2018-12-02 DIAGNOSIS — R110 Nausea: Secondary | ICD-10-CM | POA: Insufficient documentation

## 2018-12-02 DIAGNOSIS — T402X5A Adverse effect of other opioids, initial encounter: Secondary | ICD-10-CM | POA: Insufficient documentation

## 2018-12-02 DIAGNOSIS — J4520 Mild intermittent asthma, uncomplicated: Secondary | ICD-10-CM | POA: Diagnosis not present

## 2018-12-02 DIAGNOSIS — T50905A Adverse effect of unspecified drugs, medicaments and biological substances, initial encounter: Secondary | ICD-10-CM

## 2018-12-02 MED ORDER — HYDROCODONE-ACETAMINOPHEN 5-325 MG PO TABS: 1.0000 | ORAL_TABLET | Freq: Four times a day (QID) | ORAL | 0 refills | Status: DC | PRN

## 2018-12-02 NOTE — ED Notes (Signed)
Dr. Ralene Bathe and this nurse at bedside.  Pt authorized [name] to dispose of Oxycodone-Acet 5-325 mg # 29 tablets.

## 2018-12-02 NOTE — ED Triage Notes (Signed)
Pt was given prescription of Percocet yesterday for pain from gall stones. Pt states she took one pill this morning and a few hours after she took the medication she began to have itching, no rash, to her back and bilateral legs. Pt states at the time she was a little short of breath, pt also nauseated. Pt states she did not take any antihistamine

## 2018-12-02 NOTE — ED Provider Notes (Signed)
[facility] EMERGENCY DEPARTMENT Provider Note   CSN: [id number] Arrival date & time: 12/02/18  1407    History   Chief Complaint Chief Complaint  Patient presents with  . Allergic Reaction    HPI [name] is a 68 y.o. female.     The history is provided by the patient and medical records. No language interpreter was used.  Allergic Reaction   [name] is a 68 y.o. female who presents to the Emergency Department complaining of drug reaction. She presents to the emergency department complaining of reaction to Percocet. She has a history of biliary colic and has been treating her pain with hydrocodone. She was seen in the emergency department yesterday and prescribed Percocet 5/325 number 30. She took one of the pills this morning around 7 AM. Around one in the afternoon she states that she developed nausea as well as itching in her legs. Overall her symptoms were improving. She denies any fevers, vomiting, difficulty breathing. She does state that she feels jittery. She feels that this is due to the Percocet and would prefer to have a prescription for hydrocodone. Symptoms are mild and transient nature. Past Medical History:  Diagnosis Date  . A-fib ([place])   . Allergy    seasonal  . Asthma   . Chronic anticoagulation   . Eczema   . GERD (gastroesophageal reflux disease)   . Hypertension   . PAF (paroxysmal atrial fibrillation) ([place]) 11/26/2015   April/May 2017: Multiple ED visits for A. Fib.  02/12/16: Improved on diltiazem IV. Cardiology consulted in the ED and discharge patient with diltiazem XR 120 mg once daily with 60 mg by mouth every 6 hours as needed for symptomatic palpitations, flecainide 50 mg twice daily. Referred to the atrial fibrillation clinic.  02/17/16: Seen in the atrial fibrillation clinic by NP Roderic Palau. Normal sin    Patient Active Problem List   Diagnosis Date Noted  . Vaginal discharge 10/11/2018  . Vaginal bleeding  10/11/2018  . History of radiofrequency ablation (RFA) procedure for cardiac arrhythmia   . Asthma exacerbation 08/28/2018  . Chronic congestion of paranasal sinus 06/14/2018  . Primary osteoarthritis involving multiple joints 06/14/2018  . Acute pain of left knee 05/15/2018  . Hyperpigmented skin lesion 09/09/2017  . Osteoporosis without current pathological fracture 04/15/2017  . Normocytic anemia 10/19/2016  . Hyperlipidemia 03/18/2016  . Special screening for malignant neoplasms, colon 03/17/2016  . Cervical cancer screening 03/17/2016  . Assistance with transportation 03/17/2016  . HTN (hypertension), benign   . GERD (gastroesophageal reflux disease)   . PAF (paroxysmal atrial fibrillation) ([place]) 11/26/2015  . Chronic anticoagulation 11/26/2015  . Marijuana abuse 06/11/2015    Past Surgical History:  Procedure Laterality Date  . ATRIAL FIBRILLATION ABLATION N/A 09/30/2017   Procedure: ATRIAL FIBRILLATION ABLATION;  Surgeon: Thompson Grayer, MD;  Location: [facility];  Service: Cardiovascular;  Laterality: N/A;  . TONSILLECTOMY    . TUBAL LIGATION       OB History   No obstetric history on file.      Home Medications    Prior to Admission medications   Medication Sig Start Date End Date Taking? Authorizing Provider  benzonatate (TESSALON) 100 MG capsule Take 100 mg by mouth 3 (three) times daily as needed for cough.    [provider]  diltiazem (CARDIZEM) 60 MG tablet Take 60 mg by mouth every 6 (six) hours as needed (Rapid AFIB over 100).    [provider]  diltiazem (CARTIA XT) 120 MG 24 hr capsule Take 1 capsule (120 mg total) by mouth 2 (two) times daily. 10/23/18   Sherran Needs, NP  fluconazole (DIFLUCAN) 150 MG tablet Take 1 tablet (150 mg total) by mouth daily. 10/11/18   Carroll Sage, MD  fluticasone (FLOVENT HFA) 44 MCG/ACT inhaler Inhale 1 puff into the lungs 2 (two) times daily.    [provider]   HYDROcodone-acetaminophen (NORCO/VICODIN) 5-325 MG tablet Take 1 tablet by mouth every 6 (six) hours as needed. 12/02/18   Quintella Reichert, MD  levalbuterol [facility] HFA) 45 MCG/ACT inhaler INHALE 1 PUFF INTO LUNGS EVERY 6 HOURS AS NEEDED FOR SHORTNESS OF BREATH 11/29/18   [name], MD  linaclotide [facility]) 145 MCG CAPS capsule Take 1 capsule (145 mcg total) by mouth daily before breakfast. 11/08/18   [name], MD  losartan (COZAAR) 50 MG tablet TAKE 1 TABLET(50 MG) BY MOUTH DAILY Patient taking differently: Take 50 mg by mouth daily.  09/04/18   [name], MD  magnesium hydroxide (MILK OF MAGNESIA) 400 MG/5ML suspension Take 15-30 mLs by mouth at bedtime as needed for mild constipation. 11/01/17   Harris, Vernie Shanks, PA-C  metroNIDAZOLE (FLAGYL) 500 MG tablet Take 1 tablet (500 mg total) by mouth 2 (two) times daily. 10/11/18   Carroll Sage, MD  montelukast (SINGULAIR) 10 MG tablet Take 1 tablet (10 mg total) by mouth at bedtime. 05/16/18   [name], MD  Multiple Vitamin (MULTIVITAMIN WITH MINERALS) TABS tablet Take 1 tablet by mouth every other day.    [provider]  ondansetron (ZOFRAN ODT) 4 MG disintegrating tablet Take 1 tablet (4 mg total) by mouth every 8 (eight) hours as needed for nausea or vomiting. 11/21/18   Duffy Bruce, MD  potassium chloride SA (K-DUR,KLOR-CON) 20 MEQ tablet Take 1 tablet (20 mEq total) by mouth 2 (two) times daily for 3 days. 11/21/18 11/24/18  Duffy Bruce, MD  rivaroxaban (XARELTO) 20 MG TABS tablet Take 1 tablet (20 mg total) by mouth daily with supper. 08/31/18   Sherran Needs, NP  sucralfate (CARAFATE) 1 g tablet Take 1 tablet (1 g total) by mouth 4 (four) times daily. 10/16/18 10/16/19  Oval Linsey, MD  tiotropium (SPIRIVA) 18 MCG inhalation capsule Place 1 capsule (18 mcg total) into inhaler and inhale daily. 05/26/18   [name], MD  triamcinolone cream (KENALOG) 0.1 % Apply 1 application topically 2 (two) times daily. Patient  taking differently: Apply 1 application topically 2 (two) times daily as needed (rash/itching).  05/06/18   Henderly, Britni A, PA-C    Family History Family History  Problem Relation Age of Onset  . Heart attack Mother        Shelby Boots age  . Breast cancer Sister        Late 40s; now s/p mastectomy   . Lung cancer Brother        x 2  . Cancer Sister   . Cancer Sister   . Cancer Sister   . Colon cancer Neg Hx   . Colon polyps Neg Hx   . Esophageal cancer Neg Hx   . Rectal cancer Neg Hx   . Stomach cancer Neg Hx     Social History Social History   Tobacco Use  . Smoking status: Former Smoker    Last attempt to quit: 10/24/2015    Years since quitting: 3.1  . Smokeless tobacco: Never Used  Substance Use Topics  . Alcohol  use: No  . Drug use: Not Currently    Types: Marijuana    Comment: Smoked marijuana in the past.     Allergies   Albuterol; Celecoxib; and Protonix [pantoprazole sodium]   Review of Systems Review of Systems  All other systems reviewed and are negative.    Physical Exam Updated Vital Signs BP (!) 164/88 (BP Location: Right Arm)   Pulse 88   Temp 98.5 F (36.9 C) (Oral)   Resp 17   SpO2 97%   Physical Exam Vitals signs and nursing note reviewed.  Constitutional:      Appearance: She is well-developed.  HENT:     Head: Normocephalic and atraumatic.  Cardiovascular:     Rate and Rhythm: Normal rate and regular rhythm.     Heart sounds: Murmur present.  Pulmonary:     Effort: Pulmonary effort is normal. No respiratory distress.  Abdominal:     Palpations: Abdomen is soft.     Tenderness: There is no guarding or rebound.     Comments: Mild epigastric tenderness  Musculoskeletal:        General: No swelling or tenderness.  Skin:    General: Skin is warm and dry.     Capillary Refill: Capillary refill takes less than 2 seconds.     Findings: No rash.  Neurological:     Mental Status: She is alert and oriented to person, place, and time.   Psychiatric:        Behavior: Behavior normal.      ED Treatments / Results  Labs (all labs ordered are listed, but only abnormal results are displayed) Labs Reviewed - No data to display  EKG None  Radiology No results found.  Procedures Procedures (including critical care time)  Medications Ordered in ED Medications - No data to display   Initial Impression / Assessment and Plan / ED Course  I have reviewed the triage vital signs and the nursing notes.  Pertinent labs & imaging results that were available during my care of the patient were reviewed by me and considered in my medical decision making (see chart for details).        Patient here for concern for drug reaction. She is requesting her prescription be changed from Percocet hydrocodone. She is in no acute distress on evaluation with no evidence of anaphylaxis. Contacted the patient's pharmacy. They will fill a prescription for hydrocodone if it is prescribed but the patient will be liable for the cost, and will likely not be covered with her insurance. Patient was reviewed in the [place]. She has received multiple prescriptions for pain medications from multiple providers. These prescriptions recently began. She did bring in her pill bottle from home and it did contain 29 Percocet five - 325 pills. Discussed with patient that we can change her back to hydrocodone but we will need to discard the Percocet and she is in agreement with plan. Discussed that the emergency department is not the proper place for medication refills. Discussed importance of PCP or surgery follow-up. Return precautions discussed.  Final Clinical Impressions(s) / ED Diagnoses   Final diagnoses:  Adverse effect of drug, initial encounter    ED Discharge Orders         Ordered    HYDROcodone-acetaminophen (NORCO/VICODIN) 5-325 MG tablet  Every 6 hours PRN    Note to Pharmacy:  Patient was intolerant of oxycodone/acetaminophen that was  filled on 4/10.  Unused tablets were disposed of in the Emergency  Department.   12/02/18 1455           Quintella Reichert, MD 12/02/18 1500

## 2018-12-02 NOTE — ED Notes (Signed)
ED Provider at bedside. 

## 2018-12-02 NOTE — ED Notes (Signed)
Called pharmacy on how to dispose of #29 Oxy-Acet.  Luellen Pucker and this nurse crushed tabs per pharmacy and wasted.

## 2018-12-02 NOTE — ED Triage Notes (Signed)
This Probation officer witnessed  IT trainer  Wast Pt home pain meds in liquid waste container in Med room.

## 2018-12-05 ENCOUNTER — Other Ambulatory Visit: Payer: Self-pay | Admitting: *Deleted

## 2018-12-05 NOTE — Patient Outreach (Signed)
[facility]) Care Management  12/05/2018  [name] [birth year] [id number]   Telephone Screen  Referral Date: 12/05/18 Referral Source: [facility] referral Referral Reason: 12/05/18  [name]- Phone [phone] ER visits within 6 months Last ED visit on 12/02/18  Insurance:[employer] medicare   This patient is noted to have been engaged with [facility] ( SW, Health coach and community RN CM) in 2019 and January 2020   Outreach attempt # 1 No answer. [facility] RN CM left HIPAA compliant voicemail message along with CM's contact info.   Plan: [facility] RN CM sent an unsuccessful outreach letter and scheduled this patient for another call attempt within 4 business days   Conditions: PAF, HTN, Asthma, GERD,  Marijuana abuse, chronic anticoagulation, HDL, normocytic anemia, pain of left knee, hx of radiofrequency ablation, for cardiac arrhythmia osteoporosis, chronic congestion of paranasal   [name] L. Lavina Hamman, RN, BSN, [place] Coordinator Office number [phone] Mobile number [phone]  Main [facility] number [phone] Fax number [phone]

## 2018-12-05 NOTE — Telephone Encounter (Signed)
Patient went to ED the same day and have her pain medications filled out there. That is why and never prescribed anything for her.

## 2018-12-06 ENCOUNTER — Other Ambulatory Visit: Payer: Self-pay | Admitting: *Deleted

## 2018-12-06 ENCOUNTER — Emergency Department (HOSPITAL_COMMUNITY)
Admission: EM | Admit: 2018-12-06 | Discharge: 2018-12-06 | Disposition: A | Discharge status: Home / Self Care | Payer: Medicare Other | Attending: Emergency Medicine | Admitting: Emergency Medicine

## 2018-12-06 ENCOUNTER — Other Ambulatory Visit: Payer: Self-pay

## 2018-12-06 ENCOUNTER — Encounter (HOSPITAL_COMMUNITY): Payer: Self-pay | Admitting: Emergency Medicine

## 2018-12-06 DIAGNOSIS — R10813 Right lower quadrant abdominal tenderness: Secondary | ICD-10-CM | POA: Insufficient documentation

## 2018-12-06 DIAGNOSIS — Z8709 Personal history of other diseases of the respiratory system: Secondary | ICD-10-CM | POA: Diagnosis not present

## 2018-12-06 DIAGNOSIS — R10815 Periumbilic abdominal tenderness: Secondary | ICD-10-CM | POA: Diagnosis not present

## 2018-12-06 DIAGNOSIS — I4891 Unspecified atrial fibrillation: Secondary | ICD-10-CM | POA: Insufficient documentation

## 2018-12-06 DIAGNOSIS — R1013 Epigastric pain: Secondary | ICD-10-CM | POA: Insufficient documentation

## 2018-12-06 DIAGNOSIS — I10 Essential (primary) hypertension: Secondary | ICD-10-CM | POA: Diagnosis not present

## 2018-12-06 DIAGNOSIS — R0789 Other chest pain: Secondary | ICD-10-CM | POA: Insufficient documentation

## 2018-12-06 DIAGNOSIS — K8020 Calculus of gallbladder without cholecystitis without obstruction: Secondary | ICD-10-CM | POA: Insufficient documentation

## 2018-12-06 DIAGNOSIS — Z7901 Long term (current) use of anticoagulants: Secondary | ICD-10-CM | POA: Diagnosis not present

## 2018-12-06 DIAGNOSIS — R1011 Right upper quadrant pain: Secondary | ICD-10-CM | POA: Diagnosis present

## 2018-12-06 DIAGNOSIS — Z79899 Other long term (current) drug therapy: Secondary | ICD-10-CM | POA: Insufficient documentation

## 2018-12-06 DIAGNOSIS — Z87891 Personal history of nicotine dependence: Secondary | ICD-10-CM | POA: Diagnosis not present

## 2018-12-06 DIAGNOSIS — K8080 Other cholelithiasis without obstruction: Secondary | ICD-10-CM | POA: Diagnosis not present

## 2018-12-06 LAB — COMPREHENSIVE METABOLIC PANEL
ALT: 56 U/L — ABNORMAL HIGH (ref 0–44)
AST: 48 U/L — ABNORMAL HIGH (ref 15–41)
Albumin: 3.9 g/dL (ref 3.5–5.0)
Alkaline Phosphatase: 108 U/L (ref 38–126)
Anion gap: 10 (ref 5–15)
BUN: 8 mg/dL (ref 8–23)
CO2: 27 mmol/L (ref 22–32)
Calcium: 10 mg/dL (ref 8.9–10.3)
Chloride: 104 mmol/L (ref 98–111)
Creatinine, Ser: 0.9 mg/dL (ref 0.44–1.00)
GFR calc Af Amer: >60 mL/min (ref >60)
GFR calc non Af Amer: >60 mL/min (ref >60)
Glucose, Bld: 101 mg/dL — ABNORMAL HIGH (ref 70–99)
Potassium: 3.5 mmol/L (ref 3.5–5.1)
Sodium: 141 mmol/L (ref 135–145)
Total Bilirubin: 0.5 mg/dL (ref 0.3–1.2)
Total Protein: 7.2 g/dL (ref 6.5–8.1)

## 2018-12-06 LAB — CBC
HCT: 38.8 % (ref 36.0–46.0)
Hemoglobin: 12.3 g/dL (ref 12.0–15.0)
MCH: 26.1 pg (ref 26.0–34.0)
MCHC: 31.7 g/dL (ref 30.0–36.0)
MCV: 82.4 fL (ref 80.0–100.0)
Platelets: 278 10*3/uL (ref 150–400)
RBC: 4.71 MIL/uL (ref 3.87–5.11)
RDW: 15.2 % (ref 11.5–15.5)
WBC: 4.7 10*3/uL (ref 4.0–10.5)
nRBC: 0 % (ref 0.0–0.2)

## 2018-12-06 LAB — URINALYSIS, ROUTINE W REFLEX MICROSCOPIC
Bilirubin Urine: NEGATIVE
Glucose, UA: NEGATIVE mg/dL
Ketones, ur: NEGATIVE mg/dL
Leukocytes,Ua: NEGATIVE
Nitrite: NEGATIVE
Protein, ur: NEGATIVE mg/dL
Specific Gravity, Urine: 1.008 (ref 1.005–1.030)
pH: 8 (ref 5.0–8.0)

## 2018-12-06 LAB — LIPASE, BLOOD: Lipase: 23 U/L (ref 11–51)

## 2018-12-06 LAB — I-STAT TROPONIN, ED: Troponin i, poc: 0 ng/mL (ref 0.00–0.08)

## 2018-12-06 MED ORDER — FAMOTIDINE IN NACL 20-0.9 MG/50ML-% IV SOLN
20.0000 mg | Freq: Once | INTRAVENOUS | Status: AC
  Administered 2018-12-06: 16:00:00 20 mg via INTRAVENOUS
  Filled 2018-12-06: qty 50

## 2018-12-06 MED ORDER — HYDROMORPHONE HCL 1 MG/ML IJ SOLN
1.0000 mg | Freq: Once | INTRAMUSCULAR | Status: AC
  Administered 2018-12-06: 16:00:00 1 mg via INTRAVENOUS
  Filled 2018-12-06: qty 1

## 2018-12-06 MED ORDER — SODIUM CHLORIDE 0.9% FLUSH: 3.0000 mL | Freq: Once | INTRAVENOUS | Status: DC

## 2018-12-06 NOTE — Discharge Instructions (Signed)
Continue taking your home medicines as prescribed.  Please avoid fried foods, spicy foods, fatty foods, or alcohol.  These can make your symptoms worse.  Come to the hospital in 2 days for your surgery as scheduled.  Return to the emergency department immediately if any concerning signs or symptoms develop such as high fevers, persistent vomiting, or severe uncontrolled pain.

## 2018-12-06 NOTE — ED Provider Notes (Signed)
[facility] Provider Note   CSN: [id number] Arrival date & time: 12/06/18  1412    History   Chief Complaint Chief Complaint  Patient presents with  . Abdominal Pain  . Cholelithiasis    HPI [name] is a 68 y.o. female with history of A. fib, GERD, hypertension, symptomatic cholelithiasis presents for evaluation of acute onset, persistent epigastric and right upper quadrant pain radiating to the chest beginning around 1 PM today after eating pork chops.  She reports that the pain is very sharp, radiates from the epigastric region up to the midline of the chest and alternates the right and left chest.  Denies nausea, vomiting, fevers, chest pain, shortness of breath, urinary symptoms, diarrhea, or constipation.  No hematochezia.  She has tried taking hydrocodone which is prescribed to her without relief.  Symptoms worsen with movement and palpation.  Is been seen frequently in the ED for her symptomatic cholelithiasis.  She is not scheduled for laparoscopic cholecystectomy in 2 days on 12/08/2018.  She last took her Xarelto last night.  She is a former smoker, denies recreational drug use or alcohol use.     The history is provided by the patient.    Past Medical History:  Diagnosis Date  . A-fib ([place])   . Allergy    seasonal  . Asthma   . Chronic anticoagulation   . Eczema   . GERD (gastroesophageal reflux disease)   . Hypertension   . PAF (paroxysmal atrial fibrillation) ([place]) 11/26/2015   April/May 2017: Multiple ED visits for A. Fib.  02/12/16: Improved on diltiazem IV. Cardiology consulted in the ED and discharge patient with diltiazem XR 120 mg once daily with 60 mg by mouth every 6 hours as needed for symptomatic palpitations, flecainide 50 mg twice daily. Referred to the atrial fibrillation clinic.  02/17/16: Seen in the atrial fibrillation clinic by NP Roderic Palau. Normal sin    Patient Active Problem List   Diagnosis Date  Noted  . Vaginal discharge 10/11/2018  . Vaginal bleeding 10/11/2018  . History of radiofrequency ablation (RFA) procedure for cardiac arrhythmia   . Asthma exacerbation 08/28/2018  . Chronic congestion of paranasal sinus 06/14/2018  . Primary osteoarthritis involving multiple joints 06/14/2018  . Acute pain of left knee 05/15/2018  . Hyperpigmented skin lesion 09/09/2017  . Osteoporosis without current pathological fracture 04/15/2017  . Normocytic anemia 10/19/2016  . Hyperlipidemia 03/18/2016  . Special screening for malignant neoplasms, colon 03/17/2016  . Cervical cancer screening 03/17/2016  . Assistance with transportation 03/17/2016  . HTN (hypertension), benign   . GERD (gastroesophageal reflux disease)   . PAF (paroxysmal atrial fibrillation) ([place]) 11/26/2015  . Chronic anticoagulation 11/26/2015  . Marijuana abuse 06/11/2015    Past Surgical History:  Procedure Laterality Date  . ATRIAL FIBRILLATION ABLATION N/A 09/30/2017   Procedure: ATRIAL FIBRILLATION ABLATION;  Surgeon: Thompson Grayer, MD;  Location: [facility];  Service: Cardiovascular;  Laterality: N/A;  . TONSILLECTOMY    . TUBAL LIGATION       OB History   No obstetric history on file.      Home Medications    Prior to Admission medications   Medication Sig Start Date End Date Taking? Authorizing Provider  diltiazem (CARDIZEM) 60 MG tablet Take 60 mg by mouth every 6 (six) hours as needed (Rapid AFIB over 100).    [provider]  diltiazem (CARTIA XT) 120 MG 24 hr capsule Take 1 capsule (120  mg total) by mouth 2 (two) times daily. 10/23/18   Sherran Needs, NP  fluconazole (DIFLUCAN) 150 MG tablet Take 1 tablet (150 mg total) by mouth daily. Patient not taking: Reported on 12/04/2018 10/11/18   Carroll Sage, MD  fluticasone [facility] HFA) 44 MCG/ACT inhaler Inhale 1 puff into the lungs daily.     [provider]  HYDROcodone-acetaminophen (NORCO/VICODIN) 5-325 MG tablet Take 1  tablet by mouth every 6 (six) hours as needed. 12/02/18   Quintella Reichert, MD  levalbuterol [facility] HFA) 45 MCG/ACT inhaler INHALE 1 PUFF INTO LUNGS EVERY 6 HOURS AS NEEDED FOR SHORTNESS OF BREATH Patient taking differently: Inhale 1 puff into the lungs every 6 (six) hours as needed for shortness of breath.  11/29/18   [name], MD  linaclotide [facility]) 145 MCG CAPS capsule Take 1 capsule (145 mcg total) by mouth daily before breakfast. Patient not taking: Reported on 12/04/2018 11/08/18   [name], MD  losartan (COZAAR) 50 MG tablet TAKE 1 TABLET(50 MG) BY MOUTH DAILY Patient taking differently: Take 50 mg by mouth daily.  09/04/18   [name], MD  magnesium hydroxide (MILK OF MAGNESIA) 400 MG/5ML suspension Take 15-30 mLs by mouth at bedtime as needed for mild constipation. 11/01/17   Harris, Vernie Shanks, PA-C  metroNIDAZOLE (FLAGYL) 500 MG tablet Take 1 tablet (500 mg total) by mouth 2 (two) times daily. Patient not taking: Reported on 12/04/2018 10/11/18   Carroll Sage, MD  montelukast (SINGULAIR) 10 MG tablet Take 1 tablet (10 mg total) by mouth at bedtime. 05/16/18   [name], MD  Multiple Vitamin (MULTIVITAMIN WITH MINERALS) TABS tablet Take 1 tablet by mouth every other day.    [provider]  ondansetron (ZOFRAN ODT) 4 MG disintegrating tablet Take 1 tablet (4 mg total) by mouth every 8 (eight) hours as needed for nausea or vomiting. Patient not taking: Reported on 12/04/2018 11/21/18   Duffy Bruce, MD  potassium chloride SA (K-DUR,KLOR-CON) 20 MEQ tablet Take 1 tablet (20 mEq total) by mouth 2 (two) times daily for 3 days. 11/21/18 12/04/18  Duffy Bruce, MD  rivaroxaban (XARELTO) 20 MG TABS tablet Take 1 tablet (20 mg total) by mouth daily with supper. 08/31/18   Sherran Needs, NP  sucralfate (CARAFATE) 1 g tablet Take 1 tablet (1 g total) by mouth 4 (four) times daily. Patient not taking: Reported on 12/04/2018 10/16/18 10/16/19  Oval Linsey, MD  tiotropium  [facility]) 18 MCG inhalation capsule Place 1 capsule (18 mcg total) into inhaler and inhale daily. 05/26/18   [name], MD  triamcinolone cream (KENALOG) 0.1 % Apply 1 application topically 2 (two) times daily. Patient taking differently: Apply 1 application topically every other day.  05/06/18   Henderly, Britni A, PA-C    Family History Family History  Problem Relation Age of Onset  . Heart attack Mother        [name] age  . Breast cancer Sister        Late 47s; now s/p mastectomy   . Lung cancer Brother        x 2  . Cancer Sister   . Cancer Sister   . Cancer Sister   . Colon cancer Neg Hx   . Colon polyps Neg Hx   . Esophageal cancer Neg Hx   . Rectal cancer Neg Hx   . Stomach cancer Neg Hx     Social History Social History   Tobacco Use  . Smoking status: Former  Smoker    Last attempt to quit: 10/24/2015    Years since quitting: 3.1  . Smokeless tobacco: Never Used  Substance Use Topics  . Alcohol use: No  . Drug use: Not Currently    Types: Marijuana    Comment: Smoked marijuana in the past.     Allergies   Albuterol; Celecoxib; and Protonix [pantoprazole sodium]   Review of Systems Review of Systems  Constitutional: Negative for chills and fever.  Respiratory: Negative for shortness of breath.   Cardiovascular: Positive for chest pain.  Gastrointestinal: Positive for abdominal pain. Negative for diarrhea, nausea and vomiting.  Genitourinary: Negative for dysuria and hematuria.  All other systems reviewed and are negative.    Physical Exam Updated Vital Signs BP (!) 168/86   Pulse 76   Temp 98.2 F (36.8 C) (Oral)   Resp 16   SpO2 99%   Physical Exam Vitals signs and nursing note reviewed.  Constitutional:      General: She is not in acute distress.    Appearance: She is well-developed.  HENT:     Head: Normocephalic and atraumatic.  Eyes:     General:        Right eye: No discharge.        Left eye: No discharge.     Conjunctiva/sclera:  Conjunctivae normal.  Neck:     Vascular: No JVD.     Trachea: No tracheal deviation.  Cardiovascular:     Rate and Rhythm: Normal rate and regular rhythm.     Heart sounds: Normal heart sounds.  Pulmonary:     Effort: Pulmonary effort is normal.     Breath sounds: Normal breath sounds.  Abdominal:     General: Abdomen is flat. Bowel sounds are increased. There is no distension.     Palpations: Abdomen is soft.     Tenderness: There is abdominal tenderness in the right upper quadrant, right lower quadrant, epigastric area, periumbilical area and suprapubic area. There is no right CVA tenderness, left CVA tenderness, guarding or rebound. Negative signs include Murphy's sign.  Skin:    General: Skin is warm and dry.     Findings: No erythema.  Neurological:     Mental Status: She is alert.  Psychiatric:        Behavior: Behavior normal.      ED Treatments / Results  Labs (all labs ordered are listed, but only abnormal results are displayed) Labs Reviewed  COMPREHENSIVE METABOLIC PANEL - Abnormal; Notable for the following components:      Result Value   Glucose, Bld 101 (*)    AST 48 (*)    ALT 56 (*)    All other components within normal limits  URINALYSIS, ROUTINE W REFLEX MICROSCOPIC - Abnormal; Notable for the following components:   APPearance HAZY (*)    Hgb urine dipstick SMALL (*)    Bacteria, UA RARE (*)    All other components within normal limits  LIPASE, BLOOD  CBC  I-STAT TROPONIN, ED    EKG EKG Interpretation  Date/Time:  Wednesday December 06 2018 14:22:22 EDT Ventricular Rate:  84 PR Interval:  112 QRS Duration: 82 QT Interval:  370 QTC Calculation: 437 R Axis:   71 Text Interpretation:  Normal sinus rhythm Normal ECG Confirmed by Isla Pence [phone]) on 12/06/2018 3:08:43 PM   Radiology No results found.  Procedures Procedures (including critical care time)  Medications Ordered in ED Medications  sodium chloride flush (NS) 0.9 %  injection  3 mL (3 mLs Intravenous Not Given 12/06/18 1452)  HYDROmorphone (DILAUDID) injection 1 mg (1 mg Intravenous Given 12/06/18 1606)  famotidine (PEPCID) IVPB 20 mg premix (0 mg Intravenous Stopped 12/06/18 1646)     Initial Impression / Assessment and Plan / ED Course  I have reviewed the triage vital signs and the nursing notes.  Pertinent labs & imaging results that were available during my care of the patient were reviewed by me and considered in my medical decision making (see chart for details).        Patient did not answer evaluation of ongoing intermittent right upper quadrant abdominal pain and atypical chest pains.  She is afebrile, somewhat hypertensive in the ED.  Vital signs otherwise stable.  Nontoxic in appearance.  No peritoneal signs on examination of the abdomen.  EKG shows normal sinus rhythm, troponin is negative.  Her symptoms sound quite atypical in nature and I doubt ACS/PE.  Appears more consistent with GERD versus her known symptomatic cholelithiasis.  Her symptoms began after eating pork chops earlier today.  No peritoneal signs on examination of the abdomen.  Lab work reviewed by me shows no leukocytosis, no anemia, no metabolic derangements.  LFTs mildly elevated which appears to be at baseline.  UA does not suggest UTI or nephrolithiasis. 4:21 PM Spoke with Legrand Como, general surgery PA, plan to see patient in the ED emergently.  4:45PM Dr. Rosendo Gros has seen and evaluated the patient in the ED emergently. She has had good pain control in the ED. Low suspicion of acute surgical abdominal pathology. She has pain medicine at home.  Plan for elective laparoscopic cholecystectomy in 2 days as scheduled.  Patient understands to return for worsening symptoms, fevers, persistent vomiting.  Patient seen and evaluated by Dr. Gilford Raid who agrees with assessment and plan at this time. Patient appears hemodynamically stable for discharge at this time.  Final Clinical  Impressions(s) / ED Diagnoses   Final diagnoses:  Symptomatic cholelithiasis    ED Discharge Orders    None       Debroah Baller 12/06/18 1704    Isla Pence, MD 12/06/18 1750

## 2018-12-06 NOTE — Progress Notes (Signed)
Patient was seen.  Patient was recently scheduled for laparoscopic cholecystectomy in 2 days.  Patient recently stopped her Xarelto.  She states the pain started this morning after lunch and a pork chop.  Patient states the pain was right upper quadrant.  Patient states the pain is improved with pain medication.  I discussed with the patient she could follow-up on Friday as previously scheduled or we could admit her.  She was in favor of being discharged with pain medication.  I encouraged her to stay on a bland diet.  She will follow back up as scheduled for her surgery.

## 2018-12-06 NOTE — Progress Notes (Addendum)
[facility] [place], [address] AT [address] [place] [address] Phone: [phone] Fax: [phone]      Your procedure is scheduled on April 17  Report to [facility] [place] at 0900 A.M.  Call this number if you have problems the morning of surgery:  [phone]  Call [phone] if you have any questions prior to your surgery date Monday-Friday 8am-4pm    Remember:  Do not eat or drink after midnight.    Take these medicines the morning of surgery with A SIP OF WATER  diltiazem (CARDIZEM) if needed for Rapid Afib over 100 diltiazem (CARTIA XT) 120mg  fluticasone (FLOVENT HFA) HYDROcodone-acetaminophen (NORCO/VICODIN) if needed for pain levalbuterol (XOPENEX HFA)  If needed  tiotropium [facility])   Please bring all inhalers with you the day of surgery.   7 days prior to surgery STOP taking any Aspirin (unless otherwise instructed by your surgeon), Aleve, Naproxen, Ibuprofen, Motrin, Advil, Goody's, BC's, all herbal medications, fish oil, and all vitamins.  Follow your surgeon's instructions on when to stop Xarelto.  If no instructions were given by your surgeon then you will need to call the office to get those instructions.      The Morning of Surgery  Do not wear jewelry, make-up or nail polish.  Do not wear lotions, powders, or perfumes/colognes, or deodorant  Do not shave 48 hours prior to surgery.  Men may shave face and neck.  Do not bring valuables to the hospital.  [facility] is not responsible for any belongings or valuables.  If you are a smoker, DO NOT Smoke 24 hours prior to surgery  REMEMBER THAT YOU MUST SOMEONE TO TRANSPORT YOU HOME AFTER SURGERY IF YOU ARE DISCHARGED THE SAME DAY OF SURGERY  YOU WILL ALSO NEED SOMEONE TO STAY WITH YOU FOR 24 HOURS AFTER SURGERY IF YOU ARE DISCHARGED THE SAME DAY OF SURGERY   Contacts, dentures or  bridgework may not be worn into surgery.   If you wear a CPAP at night please bring your mask the morning of surgery   Leave your suitcase in the car.  After surgery it may be brought to your room.  For patients admitted to the hospital, discharge time will be determined by your treatment team.  Patients discharged the day of surgery will not be allowed to drive home.    Special instructions:   [facility]- Preparing For Surgery  Before surgery, you can play an important role. Because skin is not sterile, your skin needs to be as free of germs as possible. You can reduce the number of germs on your skin by washing with CHG (chlorahexidine gluconate) Soap before surgery.  CHG is an antiseptic cleaner which kills germs and bonds with the skin to continue killing germs even after washing.    Oral Hygiene is also important to reduce your risk of infection.  Remember - BRUSH YOUR TEETH THE MORNING OF SURGERY WITH YOUR REGULAR TOOTHPASTE  Please do not use if you have an allergy to CHG or antibacterial soaps. If your skin becomes reddened/irritated stop using the CHG.  Do not shave (including legs and underarms) for at least 48 hours prior to first CHG shower. It is OK to shave your face.  Please follow these instructions carefully.   1. Shower the NIGHT BEFORE SURGERY and the MORNING OF SURGERY with CHG Soap.  2. If you chose to wash your hair, wash your hair first as usual with your normal shampoo.  3. After you shampoo, rinse your hair and body thoroughly to remove the shampoo.  4. Use CHG as you would any other liquid soap. You can apply CHG directly to the skin and wash gently with a scrungie or a clean washcloth.   5. Apply the CHG Soap to your body ONLY FROM THE NECK DOWN.  Do not use on open wounds or open sores. Avoid contact with your eyes, ears, mouth and genitals (private parts). Wash Face and genitals (private parts)  with your normal soap.   6. Wash thoroughly, paying  special attention to the area where your surgery will be performed.  7. Thoroughly rinse your body with warm water from the neck down.  8. DO NOT shower/wash with your normal soap after using and rinsing off the CHG Soap.  9. Pat yourself dry with a CLEAN TOWEL.  10. Wear CLEAN PAJAMAS to bed the night before surgery, wear comfortable clothes the morning of surgery  11. Place CLEAN SHEETS on your bed the night of your first shower and DO NOT SLEEP WITH PETS.    Day of Surgery:  Do not apply any deodorants/lotions.  Please wear clean clothes to the hospital/surgery center.   Remember to brush your teeth WITH YOUR REGULAR TOOTHPASTE.   Please read over the following fact sheets that you were given.

## 2018-12-06 NOTE — Patient Outreach (Signed)
  [facility]) Care Management  12/06/2018  [name] March 20, 1951 [id number]   Telephone Screen  Referral Date: 12/05/18 Referral Source: [facility] referral Referral Reason: 12/05/18  [name]- Phone [phone]  15 ER visits within 6 months Last ED visit on 12/02/18  Insurance:[employer] medicare   This patient is noted to have been engaged with [facility] ( SW, Health coach and community RN CM) in 2019 and January 2020   Outreach attempt # 2 to [phone] No answer. [facility] RN CM left HIPAA compliant voicemail message along with CM's contact info.   Plan: [facility] RN CM sent an unsuccessful outreach letter on 4/14/120, left voice messages on 12/05/18 ans 12/06/18 and scheduled this patient for a final call attempt within 4 business days  Routed note to listed primary MD, [name]  Conditions: PAF, HTN, Asthma, GERD,  Marijuana abuse, chronic anticoagulation, HDL, normocytic anemia, pain of left knee, hx of radiofrequency ablation, for cardiac arrhythmia osteoporosis, chronic congestion of paranasal   Kimberly L. Lavina Hamman, RN, BSN, [place] Coordinator Office number [phone] Mobile number [phone]  Main [facility] number [id number] Fax number [phone]

## 2018-12-06 NOTE — ED Triage Notes (Signed)
Pt with epigastric pain, moaning and rocking side to side. She states "my gallstones are acting up." a/o at triage. Denies fever/ n/v/d.

## 2018-12-06 NOTE — ED Notes (Signed)
Patient verbalizes understanding of discharge instructions. Opportunity for questioning and answers were provided. Armband removed by staff, pt discharged from ED. Pt ambulatory at discharge, wheeled to lobby for comfort.

## 2018-12-07 ENCOUNTER — Encounter (HOSPITAL_COMMUNITY): Payer: Self-pay

## 2018-12-07 ENCOUNTER — Encounter (HOSPITAL_COMMUNITY)
Admission: RE | Admit: 2018-12-07 | Discharge: 2018-12-07 | Disposition: A | Discharge status: Home / Self Care | Payer: Medicare Other | Source: Ambulatory Visit | Attending: General Surgery | Admitting: General Surgery

## 2018-12-07 ENCOUNTER — Other Ambulatory Visit: Payer: Self-pay

## 2018-12-07 DIAGNOSIS — Z7901 Long term (current) use of anticoagulants: Secondary | ICD-10-CM | POA: Diagnosis not present

## 2018-12-07 DIAGNOSIS — Z01812 Encounter for preprocedural laboratory examination: Secondary | ICD-10-CM | POA: Insufficient documentation

## 2018-12-07 DIAGNOSIS — M1990 Unspecified osteoarthritis, unspecified site: Secondary | ICD-10-CM | POA: Diagnosis not present

## 2018-12-07 DIAGNOSIS — K8080 Other cholelithiasis without obstruction: Secondary | ICD-10-CM | POA: Insufficient documentation

## 2018-12-07 DIAGNOSIS — I10 Essential (primary) hypertension: Secondary | ICD-10-CM | POA: Diagnosis not present

## 2018-12-07 DIAGNOSIS — J45909 Unspecified asthma, uncomplicated: Secondary | ICD-10-CM | POA: Diagnosis not present

## 2018-12-07 DIAGNOSIS — K219 Gastro-esophageal reflux disease without esophagitis: Secondary | ICD-10-CM | POA: Diagnosis not present

## 2018-12-07 DIAGNOSIS — I480 Paroxysmal atrial fibrillation: Secondary | ICD-10-CM | POA: Diagnosis not present

## 2018-12-07 DIAGNOSIS — E7800 Pure hypercholesterolemia, unspecified: Secondary | ICD-10-CM | POA: Diagnosis not present

## 2018-12-07 DIAGNOSIS — Z79899 Other long term (current) drug therapy: Secondary | ICD-10-CM | POA: Diagnosis not present

## 2018-12-07 DIAGNOSIS — Z87891 Personal history of nicotine dependence: Secondary | ICD-10-CM | POA: Diagnosis not present

## 2018-12-07 DIAGNOSIS — K8070 Calculus of gallbladder and bile duct without cholecystitis without obstruction: Secondary | ICD-10-CM | POA: Diagnosis not present

## 2018-12-07 HISTORY — DX: Personal history of urinary calculi: Z87.442

## 2018-12-07 IMAGING — CR DG CHEST 2V
2 series · 2 of 2 positions shown · non-contrast
Comparison: 08/07/2017

CLINICAL DATA: 66-year-old female with a history of shortness of
breath and chest pain

EXAM:
CHEST  2 VIEW

[chest lat]
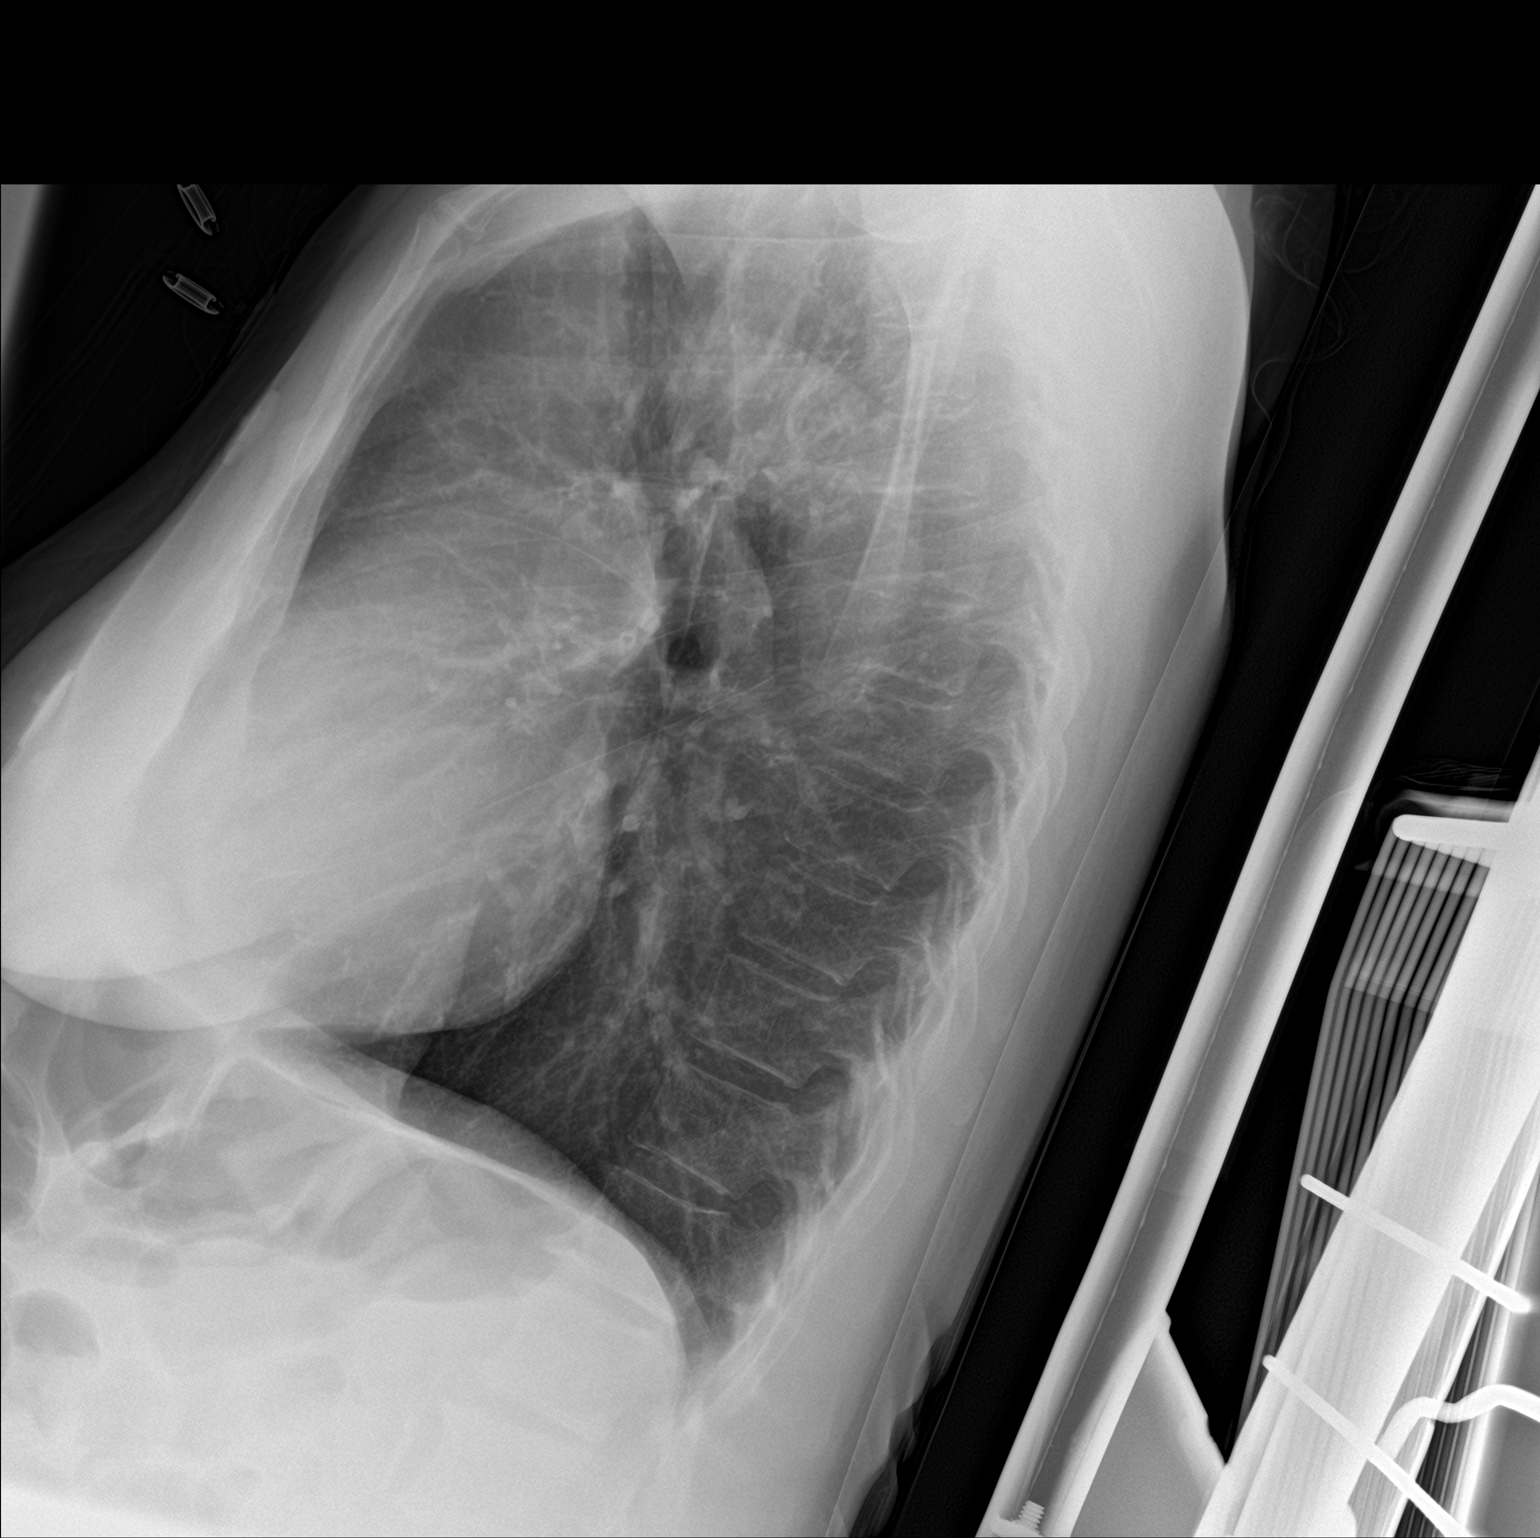

[chest ap]
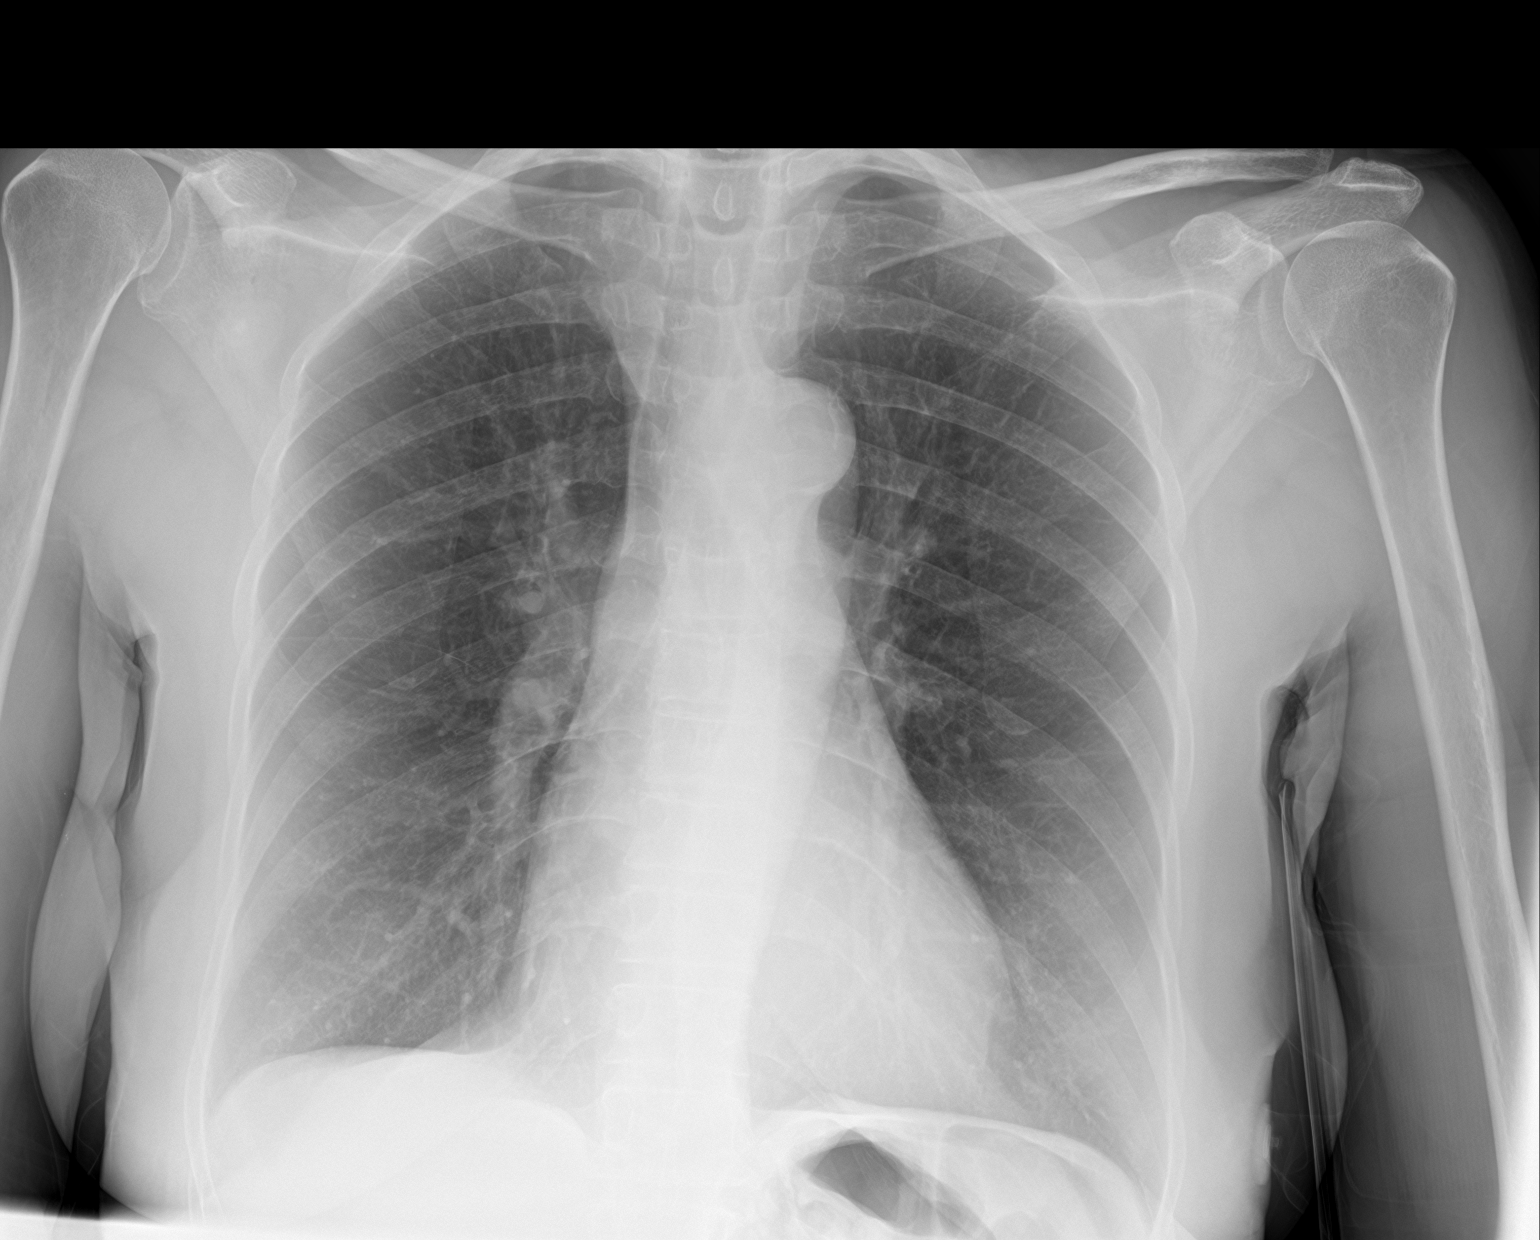

[2 of 2 positions shown; findings below may reference images not displayed]

FINDINGS: Cardiomediastinal silhouette within normal limits. No evidence of
central vascular congestion. No pneumothorax or pleural effusion. No
confluent airspace disease. No displaced fracture.
IMPRESSION: Negative for acute cardiopulmonary disease

## 2018-12-07 NOTE — Progress Notes (Signed)
Anesthesia Chart Review:  Case:  [id number] Date/Time:  12/08/18 1130   Procedure:  LAPAROSCOPIC CHOLECYSTECTOMY WITH INTRAOPERATIVE CHOLANGIOGRAM (N/A )   Anesthesia type:  General   Pre-op diagnosis:  gallstones   Location:  [facility] OR ROOM 01 / [place]   Surgeon:  Jovita Kussmaul, MD      DISCUSSION: Patient is a 68 year old female scheduled for the above procedure.  History includes former smoker (quit 2017), asthma, HTN, afib (s/p afib ablation 09/30/17), GERD.  - ED visit 12/06/18 for RUQ pain after eating a pork chop for lunch. She was seen by on-call surgeon Ralene Ok, MD. Pain improved with medications. Patient opted for discharge home with pain medication and following a bland diet.  Preoperative cardiology input on 11/30/18 per Kathyrn Drown, NP, "Given past medical history and time since last visit, based on ACC/AHA guidelines, LOREAL SCHUESSLER would be at acceptable risk for the planned procedure without further cardiovascular testing.  Per pharmacy: Patient with diagnosis ofAFIBon XARELTOfor anticoagulation.  Procedure:GALLBLADDER REMOVAL Date of procedure:TBD CHADS2-VASc score of3(HTN, AGE, female) CrCl=72 ML/MIN Per office protocol, patient can holdXARELTOfor 2days prior to procedure." Patient reported last Xarelto 12/05/18.   If no acute changes then I would anticipate that she can proceed as planned.   VS: BP 131/73   Pulse 84   Temp 36.9 C (Oral)   Resp 18   Ht 5\' 7"  (1.702 m)   Wt 67.1 kg   SpO2 98%   BMI 23.18 kg/m    PROVIDERS: [name], MD is listed as PCP Thompson Grayer, MD is EP cardiologist. Last visit 10/04/18 with Roderic Palau, NP at the [facility]. She had been evaluated in the ED recently for palpitations, but in SR/ST. Palpitations were in the setting of URI/asthma exacerbation with nebulizer treatements. Continue diltiazem and Xarelto recommended. Follow-up ~ 01/2019 advised.    LABS: Labs from 12/06/18 ED visit included: Lab  Results  Component Value Date   WBC 4.7 12/06/2018   HGB 12.3 12/06/2018   HCT 38.8 12/06/2018   PLT 278 12/06/2018   GLUCOSE 101 (H) 12/06/2018   ALT 56 (H) 12/06/2018   AST 48 (H) 12/06/2018   NA 141 12/06/2018   K 3.5 12/06/2018   CL 104 12/06/2018   CREATININE 0.90 12/06/2018   BUN 8 12/06/2018   CO2 27 12/06/2018  Troponin 0.00 12/06/18.   IMAGES: 1V CXR 10/15/18: IMPRESSION: No active disease.   EKG: 12/06/18: NSR   CV: CT cardiac 09/28/17: IMPRESSION: 1. There is normal pulmonary vein drainage into the left atrium. 2. The left atrial appendage is large with chicken wing morphology and one major lobe, ostial size 24 x 18 mm and length 40 mm. There is no thrombus in the left atrial appendage. 3. The esophagus runs to the left from the left atrial midline and is in the proximity to the ostia of LUPV and LLPV. 4. Normal coronary origin. Right dominance. Calcium score is 32 that represents 63 percentile/age/sex. The study was performed without use of NTG and insufficient for plaque evaluation. However, proximal portions of the coronary arteries have no significant plaque. 5. Pulmonary artery is dilated measuring 34 mm suggestive of pulmonary hypertension.  Echo 08/26/17: Study Conclusions - Left ventricle: The cavity size was normal. Systolic function was   normal. The estimated ejection fraction was in the range of 55%   to 60%. Wall motion was normal; there were no regional wall   motion abnormalities. Left ventricular diastolic  function   parameters were normal.  Nuclear stress test 11/27/15:  No T wave inversion was noted during stress.  There was no ST segment deviation noted during stress.  There is a medium defect of mild severity present in the basal inferoseptal, mid inferoseptal and apical septal location. The defect is non-reversible. This is most consistent with extracardiac activity on both rest and stress. No ischemia noted.  This is a low risk  study.  The left ventricular ejection fraction is normal (55-65%).  Nuclear stress EF: 58%.    Past Medical History:  Diagnosis Date  . A-fib ([place])   . Allergy    seasonal  . Asthma   . Chronic anticoagulation   . Eczema   . GERD (gastroesophageal reflux disease)   . History of kidney stones   . Hypertension   . PAF (paroxysmal atrial fibrillation) ([place]) 11/26/2015   April/May 2017: Multiple ED visits for A. Fib.  02/12/16: Improved on diltiazem IV. Cardiology consulted in the ED and discharge patient with diltiazem XR 120 mg once daily with 60 mg by mouth every 6 hours as needed for symptomatic palpitations, flecainide 50 mg twice daily. Referred to the atrial fibrillation clinic.  02/17/16: Seen in the atrial fibrillation clinic by NP Roderic Palau. Normal sin    Past Surgical History:  Procedure Laterality Date  . ATRIAL FIBRILLATION ABLATION N/A 09/30/2017   Procedure: ATRIAL FIBRILLATION ABLATION;  Surgeon: Thompson Grayer, MD;  Location: [facility];  Service: Cardiovascular;  Laterality: N/A;  . TONSILLECTOMY    . TUBAL LIGATION      MEDICATIONS: . diltiazem (CARDIZEM) 60 MG tablet  . diltiazem (CARTIA XT) 120 MG 24 hr capsule  . fluconazole (DIFLUCAN) 150 MG tablet  . fluticasone (FLOVENT HFA) 44 MCG/ACT inhaler  . HYDROcodone-acetaminophen (NORCO/VICODIN) 5-325 MG tablet  . levalbuterol (XOPENEX HFA) 45 MCG/ACT inhaler  . linaclotide (LINZESS) 145 MCG CAPS capsule  . losartan (COZAAR) 50 MG tablet  . magnesium hydroxide (MILK OF MAGNESIA) 400 MG/5ML suspension  . metroNIDAZOLE (FLAGYL) 500 MG tablet  . montelukast (SINGULAIR) 10 MG tablet  . Multiple Vitamin (MULTIVITAMIN WITH MINERALS) TABS tablet  . ondansetron (ZOFRAN ODT) 4 MG disintegrating tablet  . potassium chloride SA (K-DUR,KLOR-CON) 20 MEQ tablet  . rivaroxaban (XARELTO) 20 MG TABS tablet  . sucralfate (CARAFATE) 1 g tablet  . tiotropium (SPIRIVA) 18 MCG inhalation capsule  . triamcinolone cream  (KENALOG) 0.1 %   No current facility-administered medications for this encounter.     Myra Gianotti, PA-C Surgical Short Stay/Anesthesiology [facility] Phone [phone] [facility] Phone [phone] 12/07/2018 1:39 PM

## 2018-12-07 NOTE — Progress Notes (Signed)
SPOKE W/  _patient      SCREENING SYMPTOMS OF COVID 19:   COUGH--no  RUNNY NOSE--- no  SORE THROAT---no  NASAL CONGESTION----no  SNEEZING----no  SHORTNESS OF BREATH---no  DIFFICULTY BREATHING---no  TEMP >100.4-----no  UNEXPLAINED BODY ACHES------no   HAVE YOU OR ANY FAMILY MEMBER TRAVELLED PAST 14 DAYS OUT OF THE   COUNTY--no- STATE----no COUNTRY----no  HAVE YOU OR ANY FAMILY MEMBER BEEN EXPOSED TO ANYONE WITH COVID 19?  no    

## 2018-12-07 NOTE — Anesthesia Preprocedure Evaluation (Addendum)
Anesthesia Evaluation  Patient identified by MRN, date of birth, ID band Patient awake    Reviewed: Allergy & Precautions, H&P , NPO status , Patient's Chart, lab work & pertinent test results, reviewed documented beta blocker date and time   Airway Mallampati: I  TM Distance: >3 FB Neck ROM: full    Dental no notable dental hx. (+) Poor Dentition, Chipped, Missing, Partial Lower,    Pulmonary asthma , former smoker,    Pulmonary exam normal breath sounds clear to auscultation       Cardiovascular Exercise Tolerance: Good hypertension, negative cardio ROS   Rhythm:regular Rate:Normal  EKG: 12/06/18: NSR  CT cardiac 09/28/17: IMPRESSION: 1. There is normal pulmonary vein drainage into the left atrium. 2. The left atrial appendage is large with chicken wing morphology and one major lobe, ostial size 24 x 18 mm and length 40 mm. There is no thrombus in the left atrial appendage. 3. The esophagus runs to the left from the left atrial midline and is in the proximity to the ostia of LUPV and LLPV. 4. Normal coronary origin. Right dominance. Calcium score is 32 that represents 63 percentile/age/sex. 5. Pulmonary artery is dilated measuring 34 mm suggestive of pulmonary hypertension.  Echo 08/26/17: Study Conclusions - Left ventricle: The cavity size was normal. Systolic function was normal. The estimated ejection fraction was in the range of 55% to 60%. Wall motion was normal; there were no regional wall motion abnormalities. Left ventricular diastolic function parameters were normal.  Nuclear stress test 11/27/15:  No T wave inversion was noted during stress.  There was no ST segment deviation noted during stress.  The left ventricular ejection fraction is normal (55-65%).  Nuclear stress EF: 58%.   Neuro/Psych negative neurological ROS  negative psych ROS   GI/Hepatic Neg liver ROS, GERD  ,  Endo/Other   negative endocrine ROS  Renal/GU negative Renal ROS  negative genitourinary   Musculoskeletal  (+) Arthritis , Osteoarthritis,    Abdominal   Peds  Hematology  (+) Blood dyscrasia, anemia ,   Anesthesia Other Findings   Reproductive/Obstetrics negative OB ROS                           Anesthesia Physical Anesthesia Plan  ASA: III and emergent  Anesthesia Plan: General   Post-op Pain Management:    Induction:   PONV Risk Score and Plan: 3 and Ondansetron and Treatment may vary due to age or medical condition  Airway Management Planned:   Additional Equipment:   Intra-op Plan:   Post-operative Plan:   Informed Consent: I have reviewed the patients History and Physical, chart, labs and discussed the procedure including the risks, benefits and alternatives for the proposed anesthesia with the patient or authorized representative who has indicated his/her understanding and acceptance.     Dental Advisory Given  Plan Discussed with: CRNA, Anesthesiologist and Surgeon  Anesthesia Plan Comments: (PAT note written 12/07/2018 by Myra Gianotti, PA-C. )       Anesthesia Quick Evaluation

## 2018-12-08 ENCOUNTER — Encounter (HOSPITAL_COMMUNITY)
Admission: RE | Disposition: A | Discharge status: Home / Self Care | Payer: Self-pay | Source: Home / Self Care | Attending: General Surgery

## 2018-12-08 ENCOUNTER — Other Ambulatory Visit: Payer: Self-pay

## 2018-12-08 ENCOUNTER — Ambulatory Visit (HOSPITAL_COMMUNITY): Payer: Medicare Other | Admitting: Vascular Surgery

## 2018-12-08 ENCOUNTER — Ambulatory Visit (HOSPITAL_COMMUNITY): Payer: Medicare Other | Admitting: Anesthesiology

## 2018-12-08 ENCOUNTER — Observation Stay (HOSPITAL_COMMUNITY)
Admission: RE | Admit: 2018-12-08 | Discharge: 2018-12-10 | Disposition: A | Discharge status: Home / Self Care | Payer: Medicare Other | Attending: General Surgery | Admitting: General Surgery

## 2018-12-08 ENCOUNTER — Observation Stay (HOSPITAL_COMMUNITY): Payer: Medicare Other

## 2018-12-08 ENCOUNTER — Encounter (HOSPITAL_COMMUNITY): Payer: Self-pay

## 2018-12-08 DIAGNOSIS — Z7901 Long term (current) use of anticoagulants: Secondary | ICD-10-CM | POA: Insufficient documentation

## 2018-12-08 DIAGNOSIS — I480 Paroxysmal atrial fibrillation: Secondary | ICD-10-CM | POA: Insufficient documentation

## 2018-12-08 DIAGNOSIS — Z9049 Acquired absence of other specified parts of digestive tract: Secondary | ICD-10-CM | POA: Diagnosis not present

## 2018-12-08 DIAGNOSIS — K8010 Calculus of gallbladder with chronic cholecystitis without obstruction: Secondary | ICD-10-CM | POA: Diagnosis not present

## 2018-12-08 DIAGNOSIS — Z87891 Personal history of nicotine dependence: Secondary | ICD-10-CM | POA: Insufficient documentation

## 2018-12-08 DIAGNOSIS — M1990 Unspecified osteoarthritis, unspecified site: Secondary | ICD-10-CM | POA: Diagnosis not present

## 2018-12-08 DIAGNOSIS — K8050 Calculus of bile duct without cholangitis or cholecystitis without obstruction: Secondary | ICD-10-CM

## 2018-12-08 DIAGNOSIS — I10 Essential (primary) hypertension: Secondary | ICD-10-CM | POA: Diagnosis not present

## 2018-12-08 DIAGNOSIS — Z79899 Other long term (current) drug therapy: Secondary | ICD-10-CM | POA: Insufficient documentation

## 2018-12-08 DIAGNOSIS — K8070 Calculus of gallbladder and bile duct without cholecystitis without obstruction: Principal | ICD-10-CM | POA: Insufficient documentation

## 2018-12-08 DIAGNOSIS — Z419 Encounter for procedure for purposes other than remedying health state, unspecified: Secondary | ICD-10-CM

## 2018-12-08 DIAGNOSIS — J45909 Unspecified asthma, uncomplicated: Secondary | ICD-10-CM | POA: Diagnosis not present

## 2018-12-08 DIAGNOSIS — K219 Gastro-esophageal reflux disease without esophagitis: Secondary | ICD-10-CM | POA: Insufficient documentation

## 2018-12-08 DIAGNOSIS — E7800 Pure hypercholesterolemia, unspecified: Secondary | ICD-10-CM | POA: Insufficient documentation

## 2018-12-08 DIAGNOSIS — K8020 Calculus of gallbladder without cholecystitis without obstruction: Secondary | ICD-10-CM | POA: Diagnosis present

## 2018-12-08 DIAGNOSIS — E785 Hyperlipidemia, unspecified: Secondary | ICD-10-CM | POA: Diagnosis not present

## 2018-12-08 HISTORY — PX: CHOLECYSTECTOMY: SHX55

## 2018-12-08 LAB — PROTIME-INR
INR: 1 (ref 0.8–1.2)
Prothrombin Time: 13.5 seconds (ref 11.4–15.2)

## 2018-12-08 SURGERY — LAPAROSCOPIC CHOLECYSTECTOMY WITH INTRAOPERATIVE CHOLANGIOGRAM
Anesthesia: General | Site: Abdomen

## 2018-12-08 MED ORDER — ACETAMINOPHEN 325 MG PO TABS: 325.0000 mg | ORAL_TABLET | ORAL | Status: DC | PRN

## 2018-12-08 MED ORDER — GABAPENTIN 300 MG PO CAPS
300.0000 mg | ORAL_CAPSULE | ORAL | Status: AC
  Administered 2018-12-08: 10:00:00 300 mg via ORAL
  Filled 2018-12-08: qty 1

## 2018-12-08 MED ORDER — CEFAZOLIN SODIUM-DEXTROSE 2-4 GM/100ML-% IV SOLN
2.0000 g | INTRAVENOUS | Status: AC
  Administered 2018-12-08: 2 g via INTRAVENOUS
  Filled 2018-12-08: qty 100

## 2018-12-08 MED ORDER — DEXAMETHASONE SODIUM PHOSPHATE 10 MG/ML IJ SOLN
INTRAMUSCULAR | Status: AC
  Filled 2018-12-08: qty 1

## 2018-12-08 MED ORDER — PROPOFOL 10 MG/ML IV BOLUS
INTRAVENOUS | Status: DC | PRN
  Administered 2018-12-08: 150 mg via INTRAVENOUS

## 2018-12-08 MED ORDER — MIDAZOLAM HCL 2 MG/2ML IJ SOLN
INTRAMUSCULAR | Status: AC
  Filled 2018-12-08: qty 2

## 2018-12-08 MED ORDER — ONDANSETRON HCL 4 MG/2ML IJ SOLN: 4.0000 mg | Freq: Once | INTRAMUSCULAR | Status: DC | PRN

## 2018-12-08 MED ORDER — OXYCODONE HCL 5 MG PO TABS
ORAL_TABLET | ORAL | Status: AC
  Filled 2018-12-08: qty 1

## 2018-12-08 MED ORDER — FENTANYL CITRATE (PF) 100 MCG/2ML IJ SOLN
25.0000 ug | INTRAMUSCULAR | Status: DC | PRN
  Administered 2018-12-08 (×3): 50 ug via INTRAVENOUS

## 2018-12-08 MED ORDER — SUGAMMADEX SODIUM 200 MG/2ML IV SOLN
INTRAVENOUS | Status: DC | PRN
  Administered 2018-12-08: 134.2 mg via INTRAVENOUS

## 2018-12-08 MED ORDER — MONTELUKAST SODIUM 10 MG PO TABS
10.0000 mg | ORAL_TABLET | Freq: Every day | ORAL | Status: DC
  Administered 2018-12-08 – 2018-12-09 (×2): 10 mg via ORAL
  Filled 2018-12-08 (×3): qty 1

## 2018-12-08 MED ORDER — BUPIVACAINE-EPINEPHRINE 0.25% -1:200000 IJ SOLN
INTRAMUSCULAR | Status: DC | PRN
  Administered 2018-12-08: 13 mL

## 2018-12-08 MED ORDER — BOOST / RESOURCE BREEZE PO LIQD CUSTOM
1.0000 | Freq: Three times a day (TID) | ORAL | Status: DC
  Administered 2018-12-08 – 2018-12-10 (×3): 1 via ORAL

## 2018-12-08 MED ORDER — ACETAMINOPHEN 160 MG/5ML PO SOLN: 325.0000 mg | ORAL | Status: DC | PRN

## 2018-12-08 MED ORDER — TIOTROPIUM BROMIDE MONOHYDRATE 18 MCG IN CAPS: 18.0000 ug | ORAL_CAPSULE | Freq: Every day | RESPIRATORY_TRACT | Status: DC

## 2018-12-08 MED ORDER — SUCCINYLCHOLINE CHLORIDE 200 MG/10ML IV SOSY
PREFILLED_SYRINGE | INTRAVENOUS | Status: DC | PRN
  Administered 2018-12-08: 100 mg via INTRAVENOUS

## 2018-12-08 MED ORDER — ACETAMINOPHEN 500 MG PO TABS
1000.0000 mg | ORAL_TABLET | ORAL | Status: AC
  Administered 2018-12-08: 10:00:00 1000 mg via ORAL
  Filled 2018-12-08: qty 2

## 2018-12-08 MED ORDER — CHLORHEXIDINE GLUCONATE CLOTH 2 % EX PADS: 6.0000 | MEDICATED_PAD | Freq: Once | CUTANEOUS | Status: DC

## 2018-12-08 MED ORDER — SODIUM CHLORIDE 0.9 % IV SOLN
INTRAVENOUS | Status: DC | PRN
  Administered 2018-12-08: 25 ug/min via INTRAVENOUS

## 2018-12-08 MED ORDER — OXYCODONE HCL 5 MG/5ML PO SOLN: 5.0000 mg | Freq: Once | ORAL | Status: DC | PRN

## 2018-12-08 MED ORDER — MIDAZOLAM HCL 2 MG/2ML IJ SOLN
INTRAMUSCULAR | Status: DC | PRN
  Administered 2018-12-08: 2 mg via INTRAVENOUS

## 2018-12-08 MED ORDER — ONDANSETRON 4 MG PO TBDP: 4.0000 mg | ORAL_TABLET | Freq: Four times a day (QID) | ORAL | Status: DC | PRN

## 2018-12-08 MED ORDER — SODIUM CHLORIDE 0.9 % IV SOLN
INTRAVENOUS | Status: DC | PRN
  Administered 2018-12-08: 15 mL

## 2018-12-08 MED ORDER — DILTIAZEM HCL 60 MG PO TABS: 60.0000 mg | ORAL_TABLET | Freq: Four times a day (QID) | ORAL | Status: DC | PRN

## 2018-12-08 MED ORDER — UMECLIDINIUM BROMIDE 62.5 MCG/INH IN AEPB
1.0000 | INHALATION_SPRAY | Freq: Every day | RESPIRATORY_TRACT | Status: DC
  Filled 2018-12-08: qty 7

## 2018-12-08 MED ORDER — FAMOTIDINE IN NACL 20-0.9 MG/50ML-% IV SOLN
20.0000 mg | INTRAVENOUS | Status: DC
  Administered 2018-12-08 – 2018-12-09 (×2): 20 mg via INTRAVENOUS
  Filled 2018-12-08 (×2): qty 50

## 2018-12-08 MED ORDER — HEMOSTATIC AGENTS (NO CHARGE) OPTIME
TOPICAL | Status: DC | PRN
  Administered 2018-12-08: 1 via TOPICAL

## 2018-12-08 MED ORDER — LIDOCAINE 2% (20 MG/ML) 5 ML SYRINGE
INTRAMUSCULAR | Status: AC
  Filled 2018-12-08: qty 5

## 2018-12-08 MED ORDER — ONDANSETRON HCL 4 MG/2ML IJ SOLN
INTRAMUSCULAR | Status: AC
  Filled 2018-12-08: qty 2

## 2018-12-08 MED ORDER — 0.9 % SODIUM CHLORIDE (POUR BTL) OPTIME
TOPICAL | Status: DC | PRN
  Administered 2018-12-08: 1000 mL

## 2018-12-08 MED ORDER — FENTANYL CITRATE (PF) 100 MCG/2ML IJ SOLN
INTRAMUSCULAR | Status: AC
  Administered 2018-12-09: 10:00:00 50 ug via INTRAVENOUS
  Filled 2018-12-08: qty 2

## 2018-12-08 MED ORDER — MEPERIDINE HCL 50 MG/ML IJ SOLN: 6.2500 mg | INTRAMUSCULAR | Status: DC | PRN

## 2018-12-08 MED ORDER — BUPIVACAINE-EPINEPHRINE (PF) 0.25% -1:200000 IJ SOLN
INTRAMUSCULAR | Status: AC
  Filled 2018-12-08: qty 30

## 2018-12-08 MED ORDER — BUDESONIDE 0.25 MG/2ML IN SUSP
0.2500 mg | Freq: Two times a day (BID) | RESPIRATORY_TRACT | Status: DC
  Filled 2018-12-08 (×3): qty 2

## 2018-12-08 MED ORDER — OXYCODONE HCL 5 MG/5ML PO SOLN: 5.0000 mg | Freq: Once | ORAL | Status: AC | PRN

## 2018-12-08 MED ORDER — SODIUM CHLORIDE 0.9 % IR SOLN
Status: DC | PRN
  Administered 2018-12-08: 1000 mL

## 2018-12-08 MED ORDER — SUCCINYLCHOLINE CHLORIDE 200 MG/10ML IV SOSY
PREFILLED_SYRINGE | INTRAVENOUS | Status: AC
  Filled 2018-12-08: qty 10

## 2018-12-08 MED ORDER — ROCURONIUM BROMIDE 50 MG/5ML IV SOSY
PREFILLED_SYRINGE | INTRAVENOUS | Status: DC | PRN
  Administered 2018-12-08: 50 mg via INTRAVENOUS

## 2018-12-08 MED ORDER — ROCURONIUM BROMIDE 50 MG/5ML IV SOSY
PREFILLED_SYRINGE | INTRAVENOUS | Status: AC
  Filled 2018-12-08: qty 5

## 2018-12-08 MED ORDER — ONDANSETRON HCL 4 MG/2ML IJ SOLN
INTRAMUSCULAR | Status: DC | PRN
  Administered 2018-12-08: 4 mg via INTRAVENOUS

## 2018-12-08 MED ORDER — PROPOFOL 10 MG/ML IV BOLUS
INTRAVENOUS | Status: AC
  Filled 2018-12-08: qty 20

## 2018-12-08 MED ORDER — DEXAMETHASONE SODIUM PHOSPHATE 10 MG/ML IJ SOLN
INTRAMUSCULAR | Status: DC | PRN
  Administered 2018-12-08: 10 mg via INTRAVENOUS

## 2018-12-08 MED ORDER — LABETALOL HCL 5 MG/ML IV SOLN
INTRAVENOUS | Status: DC | PRN
  Administered 2018-12-08 (×4): 5 mg via INTRAVENOUS

## 2018-12-08 MED ORDER — FENTANYL CITRATE (PF) 100 MCG/2ML IJ SOLN: 25.0000 ug | INTRAMUSCULAR | Status: DC | PRN

## 2018-12-08 MED ORDER — KCL IN DEXTROSE-NACL 20-5-0.9 MEQ/L-%-% IV SOLN
INTRAVENOUS | Status: DC
  Administered 2018-12-08 – 2018-12-09 (×3): via INTRAVENOUS
  Filled 2018-12-08 (×4): qty 1000

## 2018-12-08 MED ORDER — LIDOCAINE 2% (20 MG/ML) 5 ML SYRINGE
INTRAMUSCULAR | Status: DC | PRN
  Administered 2018-12-08: 80 mg via INTRAVENOUS

## 2018-12-08 MED ORDER — DILTIAZEM HCL ER COATED BEADS 120 MG PO CP24
120.0000 mg | ORAL_CAPSULE | Freq: Two times a day (BID) | ORAL | Status: DC
  Administered 2018-12-08 – 2018-12-10 (×4): 120 mg via ORAL
  Filled 2018-12-08 (×4): qty 1

## 2018-12-08 MED ORDER — LACTATED RINGERS IV SOLN
INTRAVENOUS | Status: DC
  Administered 2018-12-08: 10:00:00 via INTRAVENOUS

## 2018-12-08 MED ORDER — ONDANSETRON HCL 4 MG/2ML IJ SOLN: 4.0000 mg | Freq: Four times a day (QID) | INTRAMUSCULAR | Status: DC | PRN

## 2018-12-08 MED ORDER — LOSARTAN POTASSIUM 50 MG PO TABS
50.0000 mg | ORAL_TABLET | Freq: Every day | ORAL | Status: DC
  Administered 2018-12-09 – 2018-12-10 (×2): 50 mg via ORAL
  Filled 2018-12-08 (×2): qty 1

## 2018-12-08 MED ORDER — FENTANYL CITRATE (PF) 250 MCG/5ML IJ SOLN
INTRAMUSCULAR | Status: DC | PRN
  Administered 2018-12-08 (×3): 50 ug via INTRAVENOUS
  Administered 2018-12-08: 100 ug via INTRAVENOUS

## 2018-12-08 MED ORDER — OXYCODONE HCL 5 MG PO TABS
5.0000 mg | ORAL_TABLET | Freq: Once | ORAL | Status: AC | PRN
  Administered 2018-12-08: 14:00:00 5 mg via ORAL

## 2018-12-08 MED ORDER — FENTANYL CITRATE (PF) 250 MCG/5ML IJ SOLN
INTRAMUSCULAR | Status: AC
  Filled 2018-12-08: qty 5

## 2018-12-08 MED ORDER — OXYCODONE HCL 5 MG PO TABS: 5.0000 mg | ORAL_TABLET | Freq: Once | ORAL | Status: DC | PRN

## 2018-12-08 MED ORDER — FENTANYL CITRATE (PF) 100 MCG/2ML IJ SOLN
25.0000 ug | INTRAMUSCULAR | Status: DC | PRN
  Administered 2018-12-08 – 2018-12-09 (×4): 50 ug via INTRAVENOUS
  Filled 2018-12-08 (×4): qty 2

## 2018-12-08 MED ORDER — FENTANYL CITRATE (PF) 100 MCG/2ML IJ SOLN
INTRAMUSCULAR | Status: AC
  Administered 2018-12-08: 50 ug via INTRAVENOUS
  Filled 2018-12-08: qty 2

## 2018-12-08 SURGICAL SUPPLY — 38 items
ADH SKN CLS APL DERMABOND .7 (GAUZE/BANDAGES/DRESSINGS) ×1
APPLIER CLIP 5 13 M/L LIGAMAX5 (MISCELLANEOUS) ×3
APR CLP MED LRG 5 ANG JAW (MISCELLANEOUS) ×1
BAG SPEC RTRVL LRG 6X4 10 (ENDOMECHANICALS) ×1
BLADE CLIPPER SURG (BLADE) IMPLANT
CANISTER SUCT 3000ML PPV (MISCELLANEOUS) ×3 IMPLANT
CATH REDDICK CHOLANGI 4FR 50CM (CATHETERS) ×3 IMPLANT
CHLORAPREP W/TINT 26ML (MISCELLANEOUS) ×3 IMPLANT
CLIP APPLIE 5 13 M/L LIGAMAX5 (MISCELLANEOUS) ×1 IMPLANT
COVER MAYO STAND STRL (DRAPES) ×3 IMPLANT
COVER SURGICAL LIGHT HANDLE (MISCELLANEOUS) ×3 IMPLANT
COVER WAND RF STERILE (DRAPES) ×3 IMPLANT
DERMABOND ADVANCED (GAUZE/BANDAGES/DRESSINGS) ×2
DERMABOND ADVANCED .7 DNX12 (GAUZE/BANDAGES/DRESSINGS) ×1 IMPLANT
DRAPE C-ARM 42X72 X-RAY (DRAPES) ×3 IMPLANT
ELECT REM PT RETURN 9FT ADLT (ELECTROSURGICAL) ×3
ELECTRODE REM PT RTRN 9FT ADLT (ELECTROSURGICAL) ×1 IMPLANT
GLOVE BIO SURGEON STRL SZ7.5 (GLOVE) ×3 IMPLANT
GOWN STRL REUS W/ TWL LRG LVL3 (GOWN DISPOSABLE) ×3 IMPLANT
GOWN STRL REUS W/TWL LRG LVL3 (GOWN DISPOSABLE) ×9
IV CATH 14GX2 1/4 (CATHETERS) ×3 IMPLANT
KIT BASIN OR (CUSTOM PROCEDURE TRAY) ×3 IMPLANT
KIT TURNOVER KIT B (KITS) ×3 IMPLANT
NS IRRIG 1000ML POUR BTL (IV SOLUTION) ×3 IMPLANT
PAD ARMBOARD 7.5X6 YLW CONV (MISCELLANEOUS) ×3 IMPLANT
POUCH SPECIMEN RETRIEVAL 10MM (ENDOMECHANICALS) ×3 IMPLANT
SCISSORS LAP 5X35 DISP (ENDOMECHANICALS) ×3 IMPLANT
SET IRRIG TUBING LAPAROSCOPIC (IRRIGATION / IRRIGATOR) ×3 IMPLANT
SET TUBE SMOKE EVAC HIGH FLOW (TUBING) ×3 IMPLANT
SLEEVE ENDOPATH XCEL 5M (ENDOMECHANICALS) ×6 IMPLANT
SPECIMEN JAR SMALL (MISCELLANEOUS) ×3 IMPLANT
SUT MNCRL AB 4-0 PS2 18 (SUTURE) ×3 IMPLANT
TOWEL OR 17X24 6PK STRL BLUE (TOWEL DISPOSABLE) ×3 IMPLANT
TOWEL OR 17X26 10 PK STRL BLUE (TOWEL DISPOSABLE) ×3 IMPLANT
TRAY LAPAROSCOPIC MC (CUSTOM PROCEDURE TRAY) ×3 IMPLANT
TROCAR XCEL BLUNT TIP 100MML (ENDOMECHANICALS) ×3 IMPLANT
TROCAR XCEL NON-BLD 5MMX100MML (ENDOMECHANICALS) ×3 IMPLANT
WATER STERILE IRR 1000ML POUR (IV SOLUTION) ×3 IMPLANT

## 2018-12-08 NOTE — H&P (Signed)
[name]  Location: [facility] Surgery Patient #: [id number] DOB: 04-04-1951 Single / Language: [name] / Race: Black or African American Female   History of Present Illness  The patient is a 68 year old female who presents with abdominal pain. We're asked to see the patient in consultation by Dr. Duffy Bruce to evaluate her for gallstones. The patient is a 68 year old black female who states that she has had known gallstones for many years. Over the last few months she has been experiencing daily pain associated with it. The pain is located in her right upper quadrant. The pain has been associated with some nausea but no vomiting. She denies any fevers or chills. A recent ultrasound showed many stones in the gallbladder but no gallbladder wall thickening or ductal dilatation. Her liver functions were normal.   Past Surgical History  No pertinent past surgical history   Diagnostic Studies History Mammogram  within last year Pap Smear  1-5 years ago  Allergies  No Known Drug Allergies   Medication History  Esomeprazole Magnesium (20MG  Capsule DR, Oral) Active. Albuterol (90MCG/ACT Aerosol Soln, Inhalation) Active. Potassium Chloride ER (20MEQ Tablet ER, Oral) Active. Potassium Chloride ER (10MEQ Tablet ER, Oral) Active. dilTIAZem HCl (120MG  Tablet, Oral) Active. Benzonatate (100MG  Capsule, Oral) Active. Losartan Potassium (50MG  Tablet, Oral) Active. Rivaroxaban (20MG  Tablet, Oral) Active. Spiriva HandiHaler (18MCG Capsule, Inhalation) Active. Cetirizine HCl (10MG  Tablet, Oral) Active. Montelukast Sodium (10MG  Tablet, Oral) Active. Medications Reconciled  Social History  Alcohol use  Remotely quit alcohol use. No caffeine use  Tobacco use  Never smoker.  Family History  Breast Cancer  Sister. Hypertension  Sister.  Pregnancy / Birth History Age at menarche  48 years. Gravida  4 Maternal age  37-20 Para  3 Regular periods    Other Problems  Asthma  Back Pain  High blood pressure  Hypercholesterolemia     Review of Systems  General Not Present- Appetite Loss, Chills, Fatigue, Fever, Night Sweats, Weight Gain and Weight Loss. Note: All other systems negative (unless as noted in HPI & included Review of Systems) Skin Not Present- Change in Wart/Mole, Dryness, Hives, Jaundice, New Lesions, Non-Healing Wounds, Rash and Ulcer. HEENT Not Present- Earache, Hearing Loss, Hoarseness, Nose Bleed, Oral Ulcers, Ringing in the Ears, Seasonal Allergies, Sinus Pain, Sore Throat, Visual Disturbances, Wears glasses/contact lenses and Yellow Eyes. Respiratory Not Present- Bloody sputum, Chronic Cough, Difficulty Breathing, Snoring and Wheezing. Breast Not Present- Breast Mass, Breast Pain, Nipple Discharge and Skin Changes. Cardiovascular Not Present- Chest Pain, Difficulty Breathing Lying Down, Leg Cramps, Palpitations, Rapid Heart Rate, Shortness of Breath and Swelling of Extremities. Gastrointestinal Not Present- Abdominal Pain, Bloating, Bloody Stool, Change in Bowel Habits, Chronic diarrhea, Constipation, Difficulty Swallowing, Excessive gas, Gets full quickly at meals, Hemorrhoids, Indigestion, Nausea, Rectal Pain and Vomiting. Female Genitourinary Not Present- Frequency, Nocturia, Painful Urination, Pelvic Pain and Urgency. Musculoskeletal Not Present- Back Pain, Joint Pain, Joint Stiffness, Muscle Pain, Muscle Weakness and Swelling of Extremities. Neurological Not Present- Decreased Memory, Fainting, Headaches, Numbness, Seizures, Tingling, Tremor, Trouble walking and Weakness. Psychiatric Not Present- Anxiety, Bipolar, Change in Sleep Pattern, Depression, Fearful and Frequent crying. Endocrine Not Present- Cold Intolerance, Excessive Hunger, Hair Changes, Heat Intolerance, Hot flashes and New Diabetes. Hematology Present- Blood Thinners. Not Present- Easy Bruising, Excessive bleeding, Gland problems, HIV and  Persistent Infections.  Vitals  Weight: 150.5 lb Height: 67in Body Surface Area: 1.79 m Body Mass Index: 23.57 kg/m  Pulse: 91 (Regular)  BP: 142/86 (Sitting, Left Arm,  Standard)       Physical Exam  General Mental Status-Alert. General Appearance-Consistent with stated age. Hydration-Well hydrated. Voice-Normal.  Head and Neck Head-normocephalic, atraumatic with no lesions or palpable masses. Trachea-midline. Thyroid Gland Characteristics - normal size and consistency.  Eye Eyeball - Bilateral-Extraocular movements intact. Sclera/Conjunctiva - Bilateral-No scleral icterus.  Chest and Lung Exam Chest and lung exam reveals -quiet, even and easy respiratory effort with no use of accessory muscles and on auscultation, normal breath sounds, no adventitious sounds and normal vocal resonance. Inspection Chest Wall - Normal. Back - normal.  Cardiovascular Cardiovascular examination reveals -normal heart sounds, regular rate and rhythm with no murmurs and normal pedal pulses bilaterally.  Abdomen Note: The abdomen is soft. There is moderate tenderness to palpation in the right upper quadrant. There is no palpable mass.   Neurologic Neurologic evaluation reveals -alert and oriented x 3 with no impairment of recent or remote memory. Mental Status-Normal.  Musculoskeletal Normal Exam - Left-Upper Extremity Strength Normal and Lower Extremity Strength Normal. Normal Exam - Right-Upper Extremity Strength Normal and Lower Extremity Strength Normal.  Lymphatic Head & Neck  General Head & Neck Lymphatics: Bilateral - Description - Normal. Axillary  General Axillary Region: Bilateral - Description - Normal. Tenderness - Non Tender. Femoral & Inguinal  Generalized Femoral & Inguinal Lymphatics: Bilateral - Description - Normal. Tenderness - Non Tender.    Assessment & Plan  GALLSTONES (K80.20) Impression: The patient appears to  have symptomatic gallstones. Because of the risk of further painful episodes and possible pancreatitis or think she would benefit from having her gallbladder removed. She would also like to have this done. I suspect this may need to be done more urgently given the amount of pain she is having and the fact that she is needing narcotic pain medicine on a daily basis. I have discussed with her in detail the risks and benefits of the operation as well as some of the technical aspects and she understands and wishes to proceed Current Plans Started Norco 5-325 MG Oral Tablet, 1 (one) Tablet every six hours, as needed, #10, 11/28/2018, No Refill.

## 2018-12-08 NOTE — Progress Notes (Signed)
I'm trying to call Dr. Marlou Starks of her diet before midnight but unsuccessful at this time, endorsed to night shift, according to PACU nurse she ate [place] sandwhich tolerates well.

## 2018-12-08 NOTE — Consult Note (Addendum)
Referring Provider: [facility] Primary Care Physician:  [name], MD Primary Gastroenterologist: Jolly Mango, MD     Reason for Consultation:  choledocholitiasis    ASSESSMENT / PLAN:    69. 68 yo female admitted 12/06/18 with symptomatic cholelithiasis.  She underwent laparoscopic cholecystectomy this a.m.  Currently in PACU.   2.  Abnormal IOC demonstrating CBD dilation with filling defects distally suspicious for choledocholithiasis.   -She will need ERCP with stone extraction. Dr. Benson Norway is on call for our practice over weekend and will perform the procedure. Patient is still drowsy from Surgery so will need to discuss further when more awake -Post -ERCP Indocin suppositories ordered. Of note, she has allergy to Celebrex (causes rash) so Endoscopist will give at his discretion.   3. AFib, home Xarelto is on hold.    HPI:    [name] is a 68 y.o. female with AFIB, HTN , and GERD. She isknown to Dr. Havery Moros for history of GERD, constipation, and adenomatous colon polyps in 2018 .  Patient presented to the ED 12/06/2018 with right upper quadrant pain.  RUQ u/s cholelithiasis without cholecystitis.  Patient is on Xarelto for atrial fibrillation.  Following Xarelto washout she was taken to the OR for laparoscopic cholecystectomy this a.m.  At the end of the procedure an IOC was performed and dilation of the CBD with filling defects in the distal aspect of the duct suspicious for stones.  On admission 2 days ago her transaminases were minimally elevated, liver test otherwise normal  Patient in PACU for about an hour, she is still drowsy.  She has abdominal pain, RN getting pain med.    Past Medical History:  Diagnosis Date  . A-fib ([place])   . Allergy    seasonal  . Asthma   . Chronic anticoagulation   . Eczema   . GERD (gastroesophageal reflux disease)   . History of kidney stones   . Hypertension   . PAF (paroxysmal atrial fibrillation) ([place]) 11/26/2015    April/May 2017: Multiple ED visits for A. Fib.  02/12/16: Improved on diltiazem IV. Cardiology consulted in the ED and discharge patient with diltiazem XR 120 mg once daily with 60 mg by mouth every 6 hours as needed for symptomatic palpitations, flecainide 50 mg twice daily. Referred to the atrial fibrillation clinic.  02/17/16: Seen in the atrial fibrillation clinic by NP Roderic Palau. Normal sin    Past Surgical History:  Procedure Laterality Date  . ATRIAL FIBRILLATION ABLATION N/A 09/30/2017   Procedure: ATRIAL FIBRILLATION ABLATION;  Surgeon: Thompson Grayer, MD;  Location: [place] [facility];  Service: Cardiovascular;  Laterality: N/A;  . TONSILLECTOMY    . TUBAL LIGATION      Prior to Admission medications   Medication Sig Start Date End Date Taking? Authorizing Provider  diltiazem (CARDIZEM) 60 MG tablet Take 60 mg by mouth every 6 (six) hours as needed (Rapid AFIB over 100).   Yes [provider]  diltiazem (CARTIA XT) 120 MG 24 hr capsule Take 1 capsule (120 mg total) by mouth 2 (two) times daily. 10/23/18  Yes Sherran Needs, NP  fluticasone (FLOVENT HFA) 44 MCG/ACT inhaler Inhale 1 puff into the lungs daily.    Yes [provider]  HYDROcodone-acetaminophen (NORCO/VICODIN) 5-325 MG tablet Take 1 tablet by mouth every 6 (six) hours as needed. 12/02/18  Yes Quintella Reichert, MD  levalbuterol [facility] HFA) 45 MCG/ACT inhaler INHALE 1 PUFF INTO LUNGS EVERY 6 HOURS AS NEEDED  FOR SHORTNESS OF BREATH Patient taking differently: Inhale 1 puff into the lungs every 6 (six) hours as needed for shortness of breath.  11/29/18  Yes [name], MD  losartan (COZAAR) 50 MG tablet TAKE 1 TABLET(50 MG) BY MOUTH DAILY Patient taking differently: Take 50 mg by mouth daily.  09/04/18  Yes [name], MD  montelukast (SINGULAIR) 10 MG tablet Take 1 tablet (10 mg total) by mouth at bedtime. 05/16/18  Yes [name], MD  Multiple Vitamin (MULTIVITAMIN WITH MINERALS) TABS tablet Take 1  tablet by mouth every other day.   Yes [provider]  potassium chloride SA (K-DUR,KLOR-CON) 20 MEQ tablet Take 1 tablet (20 mEq total) by mouth 2 (two) times daily for 3 days. 11/21/18 12/04/18 Yes Duffy Bruce, MD  rivaroxaban (XARELTO) 20 MG TABS tablet Take 1 tablet (20 mg total) by mouth daily with supper. 08/31/18  Yes Sherran Needs, NP  tiotropium (SPIRIVA) 18 MCG inhalation capsule Place 1 capsule (18 mcg total) into inhaler and inhale daily. 05/26/18  Yes [name], MD  triamcinolone cream (KENALOG) 0.1 % Apply 1 application topically 2 (two) times daily. Patient taking differently: Apply 1 application topically every other day.  05/06/18  Yes Henderly, Britni A, PA-C  fluconazole (DIFLUCAN) 150 MG tablet Take 1 tablet (150 mg total) by mouth daily. Patient not taking: Reported on 12/04/2018 10/11/18   Carroll Sage, MD  linaclotide [facility]) 145 MCG CAPS capsule Take 1 capsule (145 mcg total) by mouth daily before breakfast. Patient not taking: Reported on 12/04/2018 11/08/18   [name], MD  magnesium hydroxide (MILK OF MAGNESIA) 400 MG/5ML suspension Take 15-30 mLs by mouth at bedtime as needed for mild constipation. 11/01/17   Harris, Vernie Shanks, PA-C  metroNIDAZOLE (FLAGYL) 500 MG tablet Take 1 tablet (500 mg total) by mouth 2 (two) times daily. Patient not taking: Reported on 12/04/2018 10/11/18   Carroll Sage, MD  ondansetron (ZOFRAN ODT) 4 MG disintegrating tablet Take 1 tablet (4 mg total) by mouth every 8 (eight) hours as needed for nausea or vomiting. Patient not taking: Reported on 12/04/2018 11/21/18   Duffy Bruce, MD  sucralfate (CARAFATE) 1 g tablet Take 1 tablet (1 g total) by mouth 4 (four) times daily. Patient not taking: Reported on 12/04/2018 10/16/18 10/16/19  Oval Linsey, MD    Current Facility-Administered Medications  Medication Dose Route Frequency Provider Last Rate Last Dose  . acetaminophen (TYLENOL) tablet 325-650 mg  325-650 mg Oral Q4H PRN  Janeece Riggers, MD       Or  . acetaminophen (TYLENOL) solution 325-650 mg  325-650 mg Oral Q4H PRN Janeece Riggers, MD      . Chlorhexidine Gluconate Cloth 2 % PADS 6 each  6 each Topical Once Jovita Kussmaul, MD       And  . Chlorhexidine Gluconate Cloth 2 % PADS 6 each  6 each Topical Once [name], MD      . fentaNYL (SUBLIMAZE) 100 MCG/2ML injection           . fentaNYL (SUBLIMAZE) injection 25-50 mcg  25-50 mcg Intravenous Q5 min PRN Janeece Riggers, MD   50 mcg at 12/08/18 1412  . lactated ringers infusion   Intravenous Continuous Janeece Riggers, MD 10 mL/hr at 12/08/18 [phone]    . meperidine (DEMEROL) injection 6.25-12.5 mg  6.25-12.5 mg Intravenous Q5 min PRN Janeece Riggers, MD      . ondansetron [facility]) injection 4 mg  4 mg Intravenous Once PRN  Janeece Riggers, MD      . oxyCODONE (Oxy IR/ROXICODONE) 5 MG immediate release tablet             Allergies as of 12/04/2018 - Review Complete 12/04/2018  Allergen Reaction Noted  . Albuterol Other (See Comments) 09/20/2017  . Celecoxib Rash 04/11/2007  . Protonix [pantoprazole sodium] Palpitations 11/28/2015    Family History  Problem Relation Age of Onset  . Heart attack Mother        [name] age  . Breast cancer Sister        Late 14s; now s/p mastectomy   . Lung cancer Brother        x 2  . Cancer Sister   . Cancer Sister   . Cancer Sister   . Colon cancer Neg Hx   . Colon polyps Neg Hx   . Esophageal cancer Neg Hx   . Rectal cancer Neg Hx   . Stomach cancer Neg Hx     Social History   Socioeconomic History  . Marital status: Single    Spouse name: Not on file  . Number of children: Not on file  . Years of education: Not on file  . Highest education level: Not on file  Occupational History  . Occupation: Retired  Scientific laboratory technician  . Financial resource strain: Not on file  . Food insecurity:    Worry: Not on file    Inability: Not on file  . Transportation needs:    Medical: Not on file    Non-medical: Not on  file  Tobacco Use  . Smoking status: Former Smoker    Last attempt to quit: 10/24/2015    Years since quitting: 3.1  . Smokeless tobacco: Never Used  Substance and Sexual Activity  . Alcohol use: No  . Drug use: Not Currently    Types: Marijuana    Comment: Smoked marijuana in the past.  . Sexual activity: Yes    Partners: Male    Birth control/protection: Post-menopausal  Lifestyle  . Physical activity:    Days per week: Not on file    Minutes per session: Not on file  . Stress: Not on file  Relationships  . Social connections:    Talks on phone: Not on file    Gets together: Not on file    Attends religious service: Not on file    Active member of club or organization: Not on file    Attends meetings of clubs or organizations: Not on file    Relationship status: Not on file  . Intimate partner violence:    Fear of current or ex partner: Not on file    Emotionally abused: Not on file    Physically abused: Not on file    Forced sexual activity: Not on file  Other Topics Concern  . Not on file  Social History Narrative   Lives alone in [place], [place] and has 2 daughters who are within driving distance   Enjoys watching soap operas, reading, exercise    Review of Systems: All systems reviewed and negative except where noted in HPI.  Physical Exam: Vital signs in last 24 hours: Temp:  [97 F (36.1 C)-98.2 F (36.8 C)] 97 F (36.1 C) (04/17 1330) Pulse Rate:  [56-84] 58 (04/17 1415) Resp:  [13-21] 13 (04/17 1415) BP: (158-166)/(80-89) 158/80 (04/17 1415) SpO2:  [92 %-100 %] 92 % (04/17 1415) Weight:  [67.1 kg] 67.1 kg (04/17 1001)   General:   Thin female in  NAD Psych:   cooperative.  Ears:  Normal auditory acuity. Nose:  No deformity, discharge,  or lesions. Neck:  Supple; no masses Lungs:  Clear throughout to auscultation.   No wheezes, crackles, or rhonchi.  Heart:  Regular rate and rhythm; no murmurs, no lower extremity edema Abdomen:  Soft, non-distended,  post-op tenderness, BS hypoactive    Rectal:  Deferred  Msk:  Symmetrical without gross deformities. . Neurologic:  Drowsy Skin:  Intact without significant lesions or rashes.   Intake/Output from previous day: No intake/output data recorded. Intake/Output this shift: Total I/O In: 900 [I.V.:900] Out: 25 [Blood:25]  Lab Results: Recent Labs    12/06/18 1423  WBC 4.7  HGB 12.3  HCT 38.8  PLT 278   BMET Recent Labs    12/06/18 1423  NA 141  K 3.5  CL 104  CO2 27  GLUCOSE 101*  BUN 8  CREATININE 0.90  CALCIUM 10.0   LFT Recent Labs    12/06/18 1423  PROT 7.2  ALBUMIN 3.9  AST 48*  ALT 56*  ALKPHOS 108  BILITOT 0.5   PT/INR Recent Labs    12/08/18 0947  LABPROT 13.5  INR 1.0   Hepatitis Panel No results for input(s): HEPBSAG, HCVAB, HEPAIGM, HEPBIGM in the last 72 hours.    Studies/Results: Dg Cholangiogram Operative  Result Date: 12/08/2018 CLINICAL DATA:  Intraoperative cholangiogram EXAM: INTRAOPERATIVE CHOLANGIOGRAM TECHNIQUE: Cholangiographic images from the C-arm fluoroscopic device were submitted for interpretation post-operatively. Please see the procedural report for the amount of contrast and the fluoroscopy time utilized. COMPARISON:  None. FINDINGS: Contrast fills the biliary tree and duodenum. The common bile duct is dilated. There are round filling defects in the distal common bile duct. IMPRESSION: Findings are worrisome for distal common bile duct stones. Contrast does traverse across the common bile duct into the duodenum. Electronically Signed   By: Marybelle Killings M.D.   On: 12/08/2018 13:55     Tye Savoy, NP-C @  12/08/2018, 2:20 PM

## 2018-12-08 NOTE — Op Note (Addendum)
12/08/2018  1:20 PM  PATIENT:  [name]  68 y.o. female  PRE-OPERATIVE DIAGNOSIS:  gallstones  POST-OPERATIVE DIAGNOSIS:  gallstones  PROCEDURE:  Procedure(s): LAPAROSCOPIC CHOLECYSTECTOMY WITH INTRAOPERATIVE CHOLANGIOGRAM (N/A)  SURGEON:  Surgeon(s) and Role:    * Jovita Kussmaul, MD - Primary    * Alphonsa Overall, MD - Assisting  PHYSICIAN ASSISTANT:   ASSISTANTS: Dr. Lucia Gaskins   ANESTHESIA:   local and general  EBL:  25 mL   BLOOD ADMINISTERED:none  DRAINS: none   LOCAL MEDICATIONS USED:  MARCAINE     SPECIMEN:  Source of Specimen:  gallbladder  DISPOSITION OF SPECIMEN:  PATHOLOGY  COUNTS:  YES  TOURNIQUET:  * No tourniquets in log *  DICTATION: .Dragon Dictation     Procedure: After informed consent was obtained the patient was brought to the operating room and placed in the supine position on the operating room table. After adequate induction of general anesthesia the patient's abdomen was prepped with ChloraPrep allowed to dry and draped in usual sterile manner. An appropriate timeout was performed. The area below the umbilicus was infiltrated with quarter percent  Marcaine. A small incision was made with a 15 blade knife. The incision was carried down through the subcutaneous tissue bluntly with a hemostat and [employer] retractors. The linea alba was identified. The linea alba was incised with a 15 blade knife and each side was grasped with Coker clamps. The preperitoneal space was then probed with a hemostat until the peritoneum was opened and access was gained to the abdominal cavity. A 0 Vicryl pursestring stitch was placed in the fascia surrounding the opening. A Hassan cannula was then placed through the opening and anchored in place with the previously placed Vicryl purse string stitch. The abdomen was insufflated with carbon dioxide without difficulty. A laparoscope was inserted through the [facility] cannula in the right upper quadrant was inspected. Next the  epigastric region was infiltrated with % Marcaine. A small incision was made with a 15 blade knife. A 5 mm port was placed bluntly through this incision into the abdominal cavity under direct vision. Next 2 sites were chosen laterally on the right side of the abdomen for placement of 5 mm ports. Each of these areas was infiltrated with quarter percent Marcaine. Small stab incisions were made with a 15 blade knife. 5 mm ports were then placed bluntly through these incisions into the abdominal cavity under direct vision without difficulty. A blunt grasper was placed through the lateralmost 5 mm port and used to grasp the dome of the gallbladder and elevated anteriorly and superiorly. Another blunt grasper was placed through the other 5 mm port and used to retract the body and neck of the gallbladder. A dissector was placed through the epigastric port and using the electrocautery the peritoneal reflection at the gallbladder neck was opened. Blunt dissection was then carried out in this area until the gallbladder neck-cystic duct junction was readily identified and a good window was created. A single clip was placed on the gallbladder neck. A small  ductotomy was made just below the clip with laparoscopic scissors. A 14-gauge Angiocath was then placed through the anterior abdominal wall under direct vision. A Reddick cholangiogram catheter was then placed through the Angiocath and flushed. The catheter was then placed in the cystic duct and anchored in place with a clip. A cholangiogram was obtained that showed a nonobstructing filling defect in the distal common bile duct, good emptying into the duodenum and adequate length  on the cystic duct. Our catheter would not feed into the common duct.  The anchoring clip and catheters were then removed from the patient. 3 clips were placed proximally on the cystic duct and the duct was divided between the 2 sets of clips. Posterior to this the cystic artery was identified and  again dissected bluntly in a circumferential manner until a good window  was created. 2 clips were placed proximally and one distally on the artery and the artery was divided between the 2 sets of clips. Next a laparoscopic hook cautery device was used to separate the gallbladder from the liver bed. Prior to completely detaching the gallbladder from the liver bed the liver bed was inspected and several small bleeding points were coagulated with the electrocautery until the area was completely hemostatic. The gallbladder was then detached the rest of it from the liver bed without difficulty. A laparoscopic bag was inserted through the hassan port. The laparoscope was moved to the epigastric port. The gallbladder was placed within the bag and the bag was sealed.  The bag with the gallbladder was then removed with the [facility] cannula through the infraumbilical port without difficulty. The fascial defect was then closed with the previously placed Vicryl pursestring stitch as well as with another figure-of-eight 0 Vicryl stitch. The liver bed was inspected again and found to be hemostatic. Since she is on blood thinner at home I did pack the liver bed with a piece of surgical. The abdomen was irrigated with copious amounts of saline until the effluent was clear. The ports were then removed under direct vision without difficulty and were found to be hemostatic. The gas was allowed to escape. The skin incisions were all closed with interrupted 4-0 Monocryl subcuticular stitches. Dermabond dressings were applied. The patient tolerated the procedure well. At the end of the case all needle sponge and instrument counts were correct. The patient was then awakened and taken to recovery in stable condition. The assistant was instrumental for visualization and retraction during the case.  PLAN OF CARE: Admit for overnight observation  PATIENT DISPOSITION:  PACU - hemodynamically stable.   Delay start of Pharmacological VTE  agent (>24hrs) due to surgical blood loss or risk of bleeding: yes

## 2018-12-08 NOTE — Anesthesia Procedure Notes (Signed)
Procedure Name: Intubation Date/Time: 12/08/2018 11:51 AM Performed by: Renato Shin, CRNA Pre-anesthesia Checklist: Patient identified, Emergency Drugs available, Suction available and Patient being monitored Patient Re-evaluated:Patient Re-evaluated prior to induction Oxygen Delivery Method: Circle system utilized Preoxygenation: Pre-oxygenation with 100% oxygen Induction Type: IV induction and Rapid sequence Laryngoscope Size: 2 Grade View: Grade I Tube type: Oral Tube size: 7.0 mm Number of attempts: 1 Airway Equipment and Method: Stylet Placement Confirmation: ETT inserted through vocal cords under direct vision,  positive ETCO2 and breath sounds checked- equal and bilateral Secured at: 24 cm Tube secured with: Tape Dental Injury: Teeth and Oropharynx as per pre-operative assessment

## 2018-12-08 NOTE — Progress Notes (Signed)
Pt new admit from PACU s/p lap chole with 4 lap sites with skin glued, alert and oriented, room air, voided, right hand peripheral IV line, able to walk going to the bathroom with assistance, given pain meds.

## 2018-12-08 NOTE — Interval H&P Note (Signed)
History and Physical Interval Note:  12/08/2018 9:59 AM  [name]  has presented today for surgery, with the diagnosis of gallstones.  The various methods of treatment have been discussed with the patient and family. After consideration of risks, benefits and other options for treatment, the patient has consented to  Procedure(s): LAPAROSCOPIC CHOLECYSTECTOMY WITH INTRAOPERATIVE CHOLANGIOGRAM (N/A) as a surgical intervention.  The patient's history has been reviewed, patient examined, no change in status, stable for surgery.  I have reviewed the patient's chart and labs.  Questions were answered to the patient's satisfaction.     [name]

## 2018-12-08 NOTE — Transfer of Care (Signed)
Immediate Anesthesia Transfer of Care Note  Patient: [name]  Procedure(s) Performed: LAPAROSCOPIC CHOLECYSTECTOMY WITH INTRAOPERATIVE CHOLANGIOGRAM (N/A Abdomen)  Patient Location: PACU  Anesthesia Type:General  Level of Consciousness: awake, drowsy and patient cooperative  Airway & Oxygen Therapy: Patient Spontanous Breathing and Patient connected to face mask oxygen  Post-op Assessment: Report given to RN and Post -op Vital signs reviewed and stable  Post vital signs: Reviewed and stable  Last Vitals:  Vitals Value Taken Time  BP 163/86 12/08/2018  1:30 PM  Temp    Pulse 57 12/08/2018  1:31 PM  Resp 14 12/08/2018  1:31 PM  SpO2 100 % 12/08/2018  1:31 PM  Vitals shown include unvalidated device data.  Last Pain:  Vitals:   12/08/18 1001  TempSrc:   PainSc: 8       Patients Stated Pain Goal: 3 (98/02/21 7981)  Complications: No apparent anesthesia complications

## 2018-12-09 ENCOUNTER — Observation Stay (HOSPITAL_COMMUNITY): Payer: Medicare Other | Admitting: Anesthesiology

## 2018-12-09 ENCOUNTER — Encounter (HOSPITAL_COMMUNITY): Payer: Self-pay | Admitting: Certified Registered"

## 2018-12-09 ENCOUNTER — Encounter (HOSPITAL_COMMUNITY)
Admission: RE | Disposition: A | Discharge status: Home / Self Care | Payer: Self-pay | Source: Home / Self Care | Attending: General Surgery

## 2018-12-09 ENCOUNTER — Observation Stay (HOSPITAL_COMMUNITY): Payer: Medicare Other

## 2018-12-09 DIAGNOSIS — Z79899 Other long term (current) drug therapy: Secondary | ICD-10-CM | POA: Diagnosis not present

## 2018-12-09 DIAGNOSIS — K8050 Calculus of bile duct without cholangitis or cholecystitis without obstruction: Secondary | ICD-10-CM | POA: Diagnosis not present

## 2018-12-09 DIAGNOSIS — E7800 Pure hypercholesterolemia, unspecified: Secondary | ICD-10-CM | POA: Diagnosis not present

## 2018-12-09 DIAGNOSIS — K8020 Calculus of gallbladder without cholecystitis without obstruction: Secondary | ICD-10-CM | POA: Diagnosis not present

## 2018-12-09 DIAGNOSIS — M1990 Unspecified osteoarthritis, unspecified site: Secondary | ICD-10-CM | POA: Diagnosis not present

## 2018-12-09 DIAGNOSIS — I10 Essential (primary) hypertension: Secondary | ICD-10-CM | POA: Diagnosis not present

## 2018-12-09 DIAGNOSIS — R932 Abnormal findings on diagnostic imaging of liver and biliary tract: Secondary | ICD-10-CM | POA: Diagnosis not present

## 2018-12-09 DIAGNOSIS — K8070 Calculus of gallbladder and bile duct without cholecystitis without obstruction: Secondary | ICD-10-CM | POA: Diagnosis not present

## 2018-12-09 DIAGNOSIS — Z7901 Long term (current) use of anticoagulants: Secondary | ICD-10-CM | POA: Diagnosis not present

## 2018-12-09 DIAGNOSIS — Z87891 Personal history of nicotine dependence: Secondary | ICD-10-CM | POA: Diagnosis not present

## 2018-12-09 DIAGNOSIS — K219 Gastro-esophageal reflux disease without esophagitis: Secondary | ICD-10-CM | POA: Diagnosis not present

## 2018-12-09 DIAGNOSIS — I480 Paroxysmal atrial fibrillation: Secondary | ICD-10-CM | POA: Diagnosis not present

## 2018-12-09 DIAGNOSIS — J45909 Unspecified asthma, uncomplicated: Secondary | ICD-10-CM | POA: Diagnosis not present

## 2018-12-09 HISTORY — PX: ERCP: SHX5425

## 2018-12-09 HISTORY — PX: SPHINCTEROTOMY: SHX5544

## 2018-12-09 LAB — COMPREHENSIVE METABOLIC PANEL
ALT: 644 U/L — ABNORMAL HIGH (ref 0–44)
AST: 673 U/L — ABNORMAL HIGH (ref 15–41)
Albumin: 3.2 g/dL — ABNORMAL LOW (ref 3.5–5.0)
Alkaline Phosphatase: 258 U/L — ABNORMAL HIGH (ref 38–126)
Anion gap: 7 (ref 5–15)
BUN: 6 mg/dL — ABNORMAL LOW (ref 8–23)
CO2: 26 mmol/L (ref 22–32)
Calcium: 9.6 mg/dL (ref 8.9–10.3)
Chloride: 106 mmol/L (ref 98–111)
Creatinine, Ser: 0.8 mg/dL (ref 0.44–1.00)
GFR calc Af Amer: >60 mL/min (ref >60)
GFR calc non Af Amer: >60 mL/min (ref >60)
Glucose, Bld: 175 mg/dL — ABNORMAL HIGH (ref 70–99)
Potassium: 3.9 mmol/L (ref 3.5–5.1)
Sodium: 139 mmol/L (ref 135–145)
Total Bilirubin: 0.8 mg/dL (ref 0.3–1.2)
Total Protein: 6.4 g/dL — ABNORMAL LOW (ref 6.5–8.1)

## 2018-12-09 SURGERY — ERCP, WITH INTERVENTION IF INDICATED
Anesthesia: General

## 2018-12-09 MED ORDER — ONDANSETRON HCL 4 MG/2ML IJ SOLN
INTRAMUSCULAR | Status: DC | PRN
  Administered 2018-12-09: 4 mg via INTRAVENOUS

## 2018-12-09 MED ORDER — GLUCAGON HCL RDNA (DIAGNOSTIC) 1 MG IJ SOLR
INTRAMUSCULAR | Status: AC
  Filled 2018-12-09: qty 1

## 2018-12-09 MED ORDER — FENTANYL CITRATE (PF) 100 MCG/2ML IJ SOLN
INTRAMUSCULAR | Status: DC | PRN
  Administered 2018-12-09 (×2): 50 ug via INTRAVENOUS

## 2018-12-09 MED ORDER — GLYCOPYRROLATE PF 0.2 MG/ML IJ SOSY
PREFILLED_SYRINGE | INTRAMUSCULAR | Status: DC | PRN
  Administered 2018-12-09: .1 mg via INTRAVENOUS

## 2018-12-09 MED ORDER — INDOMETHACIN 50 MG RE SUPP
RECTAL | Status: AC
  Filled 2018-12-09: qty 2

## 2018-12-09 MED ORDER — DOCUSATE SODIUM 100 MG PO CAPS
100.0000 mg | ORAL_CAPSULE | Freq: Two times a day (BID) | ORAL | Status: DC
  Administered 2018-12-09 – 2018-12-10 (×3): 100 mg via ORAL
  Filled 2018-12-09 (×3): qty 1

## 2018-12-09 MED ORDER — PROMETHAZINE HCL 25 MG/ML IJ SOLN
INTRAMUSCULAR | Status: AC
  Administered 2018-12-09: 09:00:00 6.25 mg via INTRAVENOUS
  Filled 2018-12-09: qty 1

## 2018-12-09 MED ORDER — LACTATED RINGERS IV SOLN
INTRAVENOUS | Status: DC | PRN
  Administered 2018-12-09: 07:00:00 via INTRAVENOUS

## 2018-12-09 MED ORDER — PROMETHAZINE HCL 25 MG/ML IJ SOLN
6.2500 mg | Freq: Once | INTRAMUSCULAR | Status: AC
  Administered 2018-12-09: 09:00:00 6.25 mg via INTRAVENOUS

## 2018-12-09 MED ORDER — SODIUM CHLORIDE 0.9 % IV SOLN
1.5000 g | Freq: Once | INTRAVENOUS | Status: DC
  Filled 2018-12-09: qty 1.5

## 2018-12-09 MED ORDER — FENTANYL CITRATE (PF) 100 MCG/2ML IJ SOLN
25.0000 ug | INTRAMUSCULAR | Status: DC | PRN
  Administered 2018-12-09: 10:00:00 50 ug via INTRAVENOUS

## 2018-12-09 MED ORDER — FENTANYL CITRATE (PF) 100 MCG/2ML IJ SOLN
INTRAMUSCULAR | Status: AC
  Filled 2018-12-09: qty 2

## 2018-12-09 MED ORDER — PROPOFOL 10 MG/ML IV BOLUS
INTRAVENOUS | Status: DC | PRN
  Administered 2018-12-09: 130 mg via INTRAVENOUS

## 2018-12-09 MED ORDER — POLYETHYLENE GLYCOL 3350 17 G PO PACK
17.0000 g | PACK | Freq: Every day | ORAL | Status: DC
  Administered 2018-12-09: 17 g via ORAL
  Filled 2018-12-09: qty 1

## 2018-12-09 MED ORDER — LACTATED RINGERS IV SOLN: INTRAVENOUS | Status: DC

## 2018-12-09 MED ORDER — LORATADINE 10 MG PO TABS
10.0000 mg | ORAL_TABLET | Freq: Every day | ORAL | Status: DC
  Filled 2018-12-09: qty 1

## 2018-12-09 MED ORDER — ROCURONIUM BROMIDE 50 MG/5ML IV SOSY
PREFILLED_SYRINGE | INTRAVENOUS | Status: DC | PRN
  Administered 2018-12-09: 30 mg via INTRAVENOUS

## 2018-12-09 MED ORDER — CIPROFLOXACIN IN D5W 400 MG/200ML IV SOLN
INTRAVENOUS | Status: DC | PRN
  Administered 2018-12-09: 400 mg via INTRAVENOUS

## 2018-12-09 MED ORDER — CIPROFLOXACIN IN D5W 400 MG/200ML IV SOLN
INTRAVENOUS | Status: AC
  Filled 2018-12-09: qty 200

## 2018-12-09 MED ORDER — SUCCINYLCHOLINE CHLORIDE 200 MG/10ML IV SOSY
PREFILLED_SYRINGE | INTRAVENOUS | Status: DC | PRN
  Administered 2018-12-09: 70 mg via INTRAVENOUS

## 2018-12-09 MED ORDER — SODIUM CHLORIDE 0.9 % IV SOLN
INTRAVENOUS | Status: DC | PRN
  Administered 2018-12-09: 15 mL

## 2018-12-09 MED ORDER — LIDOCAINE 2% (20 MG/ML) 5 ML SYRINGE
INTRAMUSCULAR | Status: DC | PRN
  Administered 2018-12-09: 60 mg via INTRAVENOUS

## 2018-12-09 MED ORDER — HYDROCODONE-ACETAMINOPHEN 5-325 MG PO TABS
1.0000 | ORAL_TABLET | ORAL | Status: DC | PRN
  Administered 2018-12-09 – 2018-12-10 (×4): 1 via ORAL
  Filled 2018-12-09 (×3): qty 1
  Filled 2018-12-09: qty 2

## 2018-12-09 MED ORDER — SUGAMMADEX SODIUM 200 MG/2ML IV SOLN
INTRAVENOUS | Status: DC | PRN
  Administered 2018-12-09: 200 mg via INTRAVENOUS

## 2018-12-09 MED ORDER — DEXAMETHASONE SODIUM PHOSPHATE 10 MG/ML IJ SOLN
INTRAMUSCULAR | Status: DC | PRN
  Administered 2018-12-09: 6 mg via INTRAVENOUS

## 2018-12-09 MED ORDER — INDOMETHACIN 50 MG RE SUPP: 100.0000 mg | Freq: Once | RECTAL | Status: DC

## 2018-12-09 NOTE — Anesthesia Preprocedure Evaluation (Addendum)
Anesthesia Evaluation  Patient identified by MRN, date of birth, ID band Patient awake    Reviewed: Allergy & Precautions, H&P , NPO status , Patient's Chart, lab work & pertinent test results, reviewed documented beta blocker date and time   History of Anesthesia Complications Negative for: history of anesthetic complications  Airway Mallampati: I  TM Distance: >3 FB Neck ROM: full    Dental  (+) Poor Dentition, Chipped, Missing, Partial Lower, Dental Advisory Given,    Pulmonary asthma , former smoker,    breath sounds clear to auscultation       Cardiovascular Exercise Tolerance: Good hypertension, Pt. on medications + dysrhythmias Atrial Fibrillation  Rhythm:regular Rate:Normal  EKG: 12/06/18: NSR  CT cardiac 09/28/17: IMPRESSION: 1. There is normal pulmonary vein drainage into the left atrium. 2. The left atrial appendage is large with chicken wing morphology and one major lobe, ostial size 24 x 18 mm and length 40 mm. There is no thrombus in the left atrial appendage. 3. The esophagus runs to the left from the left atrial midline and is in the proximity to the ostia of LUPV and LLPV. 4. Normal coronary origin. Right dominance. Calcium score is 32 that represents 63 percentile/age/sex. 5. Pulmonary artery is dilated measuring 34 mm suggestive of pulmonary hypertension.  Echo 08/26/17: Study Conclusions - Left ventricle: The cavity size was normal. Systolic function was normal. The estimated ejection fraction was in the range of 55% to 60%. Wall motion was normal; there were no regional wall motion abnormalities. Left ventricular diastolic function parameters were normal.  Nuclear stress test 11/27/15:  No T wave inversion was noted during stress.  There was no ST segment deviation noted during stress.  The left ventricular ejection fraction is normal (55-65%).  Nuclear stress EF: 58%.    Neuro/Psych negative neurological ROS  negative psych ROS   GI/Hepatic Neg liver ROS, GERD  ,  Endo/Other  negative endocrine ROS  Renal/GU negative Renal ROS     Musculoskeletal  (+) Arthritis , Osteoarthritis,    Abdominal   Peds  Hematology  (+) Blood dyscrasia, anemia ,   Anesthesia Other Findings   Reproductive/Obstetrics                            Anesthesia Physical Anesthesia Plan  ASA: III  Anesthesia Plan: General   Post-op Pain Management:    Induction: Intravenous and Rapid sequence  PONV Risk Score and Plan: 3 and Ondansetron and Dexamethasone  Airway Management Planned: Oral ETT  Additional Equipment: None  Intra-op Plan:   Post-operative Plan: Extubation in OR  Informed Consent: I have reviewed the patients History and Physical, chart, labs and discussed the procedure including the risks, benefits and alternatives for the proposed anesthesia with the patient or authorized representative who has indicated his/her understanding and acceptance.     Dental advisory given  Plan Discussed with: CRNA and Surgeon  Anesthesia Plan Comments:         Anesthesia Quick Evaluation

## 2018-12-09 NOTE — Transfer of Care (Signed)
Immediate Anesthesia Transfer of Care Note  Patient: [name]  Procedure(s) Performed: ENDOSCOPIC RETROGRADE CHOLANGIOPANCREATOGRAPHY (ERCP) (N/A ) SPHINCTEROTOMY REMOVAL OF STONES  Patient Location: PACU  Anesthesia Type:General  Level of Consciousness: awake and patient cooperative  Airway & Oxygen Therapy: Patient Spontanous Breathing and Patient connected to face mask oxygen  Post-op Assessment: Report given to RN and Post -op Vital signs reviewed and stable  Post vital signs: Reviewed and stable  Last Vitals:  Vitals Value Taken Time  BP 154/84 12/09/2018  8:33 AM  Temp    Pulse 79 12/09/2018  8:36 AM  Resp 12 12/09/2018  8:36 AM  SpO2 100 % 12/09/2018  8:36 AM  Vitals shown include unvalidated device data.  Last Pain:  Vitals:   12/09/18 0705  TempSrc: Oral  PainSc: 0-No pain      Patients Stated Pain Goal: 3 (62/37/62 8315)  Complications: No apparent anesthesia complications

## 2018-12-09 NOTE — H&P (View-Only) (Signed)
Day of Surgery   Subjective/Chief Complaint: No complaints. Going down for ERCP now for retained cbd stone   Objective: Vital signs in last 24 hours: Temp:  [97 F (36.1 C)-98.3 F (36.8 C)] 98 F (36.7 C) (04/18 0705) Pulse Rate:  [56-84] 72 (04/18 0705) Resp:  [11-21] 18 (04/18 0705) BP: (126-169)/(72-89) 169/82 (04/18 0705) SpO2:  [92 %-100 %] 96 % (04/18 0705) Weight:  [67.1 kg] 67.1 kg (04/17 1001) Last BM Date: 12/06/18  Intake/Output from previous day: 04/17 0701 - 04/18 0700 In: 900 [I.V.:900] Out: 25 [Blood:25] Intake/Output this shift: No intake/output data recorded.  General appearance: alert and cooperative Resp: clear to auscultation bilaterally Cardio: regular rate and rhythm GI: soft, mild tenderness  Lab Results:  Recent Labs    12/06/18 1423  WBC 4.7  HGB 12.3  HCT 38.8  PLT 278   BMET Recent Labs    12/06/18 1423 12/09/18 0308  NA 141 139  K 3.5 3.9  CL 104 106  CO2 27 26  GLUCOSE 101* 175*  BUN 8 6*  CREATININE 0.90 0.80  CALCIUM 10.0 9.6   PT/INR Recent Labs    12/08/18 0947  LABPROT 13.5  INR 1.0   ABG No results for input(s): PHART, HCO3 in the last 72 hours.  Invalid input(s): PCO2, PO2  Studies/Results: Dg Cholangiogram Operative  Result Date: 12/08/2018 CLINICAL DATA:  Intraoperative cholangiogram EXAM: INTRAOPERATIVE CHOLANGIOGRAM TECHNIQUE: Cholangiographic images from the C-arm fluoroscopic device were submitted for interpretation post-operatively. Please see the procedural report for the amount of contrast and the fluoroscopy time utilized. COMPARISON:  None. FINDINGS: Contrast fills the biliary tree and duodenum. The common bile duct is dilated. There are round filling defects in the distal common bile duct. IMPRESSION: Findings are worrisome for distal common bile duct stones. Contrast does traverse across the common bile duct into the duodenum. Electronically Signed   By: Marybelle Killings M.D.   On: 12/08/2018 13:55     Anti-infectives: Anti-infectives (From admission, onward)   Start     Dose/Rate Route Frequency Ordered Stop   12/09/18 0715  ampicillin-sulbactam (UNASYN) 1.5 g in sodium chloride 0.9 % 100 mL IVPB     1.5 g 200 mL/hr over 30 Minutes Intravenous  Once 12/09/18 0707     12/08/18 0930  ceFAZolin (ANCEF) IVPB 2g/100 mL premix     2 g 200 mL/hr over 30 Minutes Intravenous On call to O.R. 12/08/18 0918 12/08/18 1225      Assessment/Plan: s/p Procedure(s): ENDOSCOPIC RETROGRADE CHOLANGIOPANCREATOGRAPHY (ERCP) (N/A) Appreciate GI assistance. For ERCP today for retained cbd stone. Advance diet after procedure and monitor POD 1  LOS: 0 days    [name] 12/09/2018

## 2018-12-09 NOTE — Progress Notes (Signed)
Day of Surgery   Subjective/Chief Complaint: No complaints. Going down for ERCP now for retained cbd stone   Objective: Vital signs in last 24 hours: Temp:  [97 F (36.1 C)-98.3 F (36.8 C)] 98 F (36.7 C) (04/18 0705) Pulse Rate:  [56-84] 72 (04/18 0705) Resp:  [11-21] 18 (04/18 0705) BP: (126-169)/(72-89) 169/82 (04/18 0705) SpO2:  [92 %-100 %] 96 % (04/18 0705) Weight:  [67.1 kg] 67.1 kg (04/17 1001) Last BM Date: 12/06/18  Intake/Output from previous day: 04/17 0701 - 04/18 0700 In: 900 [I.V.:900] Out: 25 [Blood:25] Intake/Output this shift: No intake/output data recorded.  General appearance: alert and cooperative Resp: clear to auscultation bilaterally Cardio: regular rate and rhythm GI: soft, mild tenderness  Lab Results:  Recent Labs    12/06/18 1423  WBC 4.7  HGB 12.3  HCT 38.8  PLT 278   BMET Recent Labs    12/06/18 1423 12/09/18 0308  NA 141 139  K 3.5 3.9  CL 104 106  CO2 27 26  GLUCOSE 101* 175*  BUN 8 6*  CREATININE 0.90 0.80  CALCIUM 10.0 9.6   PT/INR Recent Labs    12/08/18 0947  LABPROT 13.5  INR 1.0   ABG No results for input(s): PHART, HCO3 in the last 72 hours.  Invalid input(s): PCO2, PO2  Studies/Results: Dg Cholangiogram Operative  Result Date: 12/08/2018 CLINICAL DATA:  Intraoperative cholangiogram EXAM: INTRAOPERATIVE CHOLANGIOGRAM TECHNIQUE: Cholangiographic images from the C-arm fluoroscopic device were submitted for interpretation post-operatively. Please see the procedural report for the amount of contrast and the fluoroscopy time utilized. COMPARISON:  None. FINDINGS: Contrast fills the biliary tree and duodenum. The common bile duct is dilated. There are round filling defects in the distal common bile duct. IMPRESSION: Findings are worrisome for distal common bile duct stones. Contrast does traverse across the common bile duct into the duodenum. Electronically Signed   By: Marybelle Killings M.D.   On: 12/08/2018 13:55     Anti-infectives: Anti-infectives (From admission, onward)   Start     Dose/Rate Route Frequency Ordered Stop   12/09/18 0715  ampicillin-sulbactam (UNASYN) 1.5 g in sodium chloride 0.9 % 100 mL IVPB     1.5 g 200 mL/hr over 30 Minutes Intravenous  Once 12/09/18 0707     12/08/18 0930  ceFAZolin (ANCEF) IVPB 2g/100 mL premix     2 g 200 mL/hr over 30 Minutes Intravenous On call to O.R. 12/08/18 0918 12/08/18 1225      Assessment/Plan: s/p Procedure(s): ENDOSCOPIC RETROGRADE CHOLANGIOPANCREATOGRAPHY (ERCP) (N/A) Appreciate GI assistance. For ERCP today for retained cbd stone. Advance diet after procedure and monitor POD 1  LOS: 0 days    [name] 12/09/2018

## 2018-12-09 NOTE — Interval H&P Note (Signed)
History and Physical Interval Note:  12/09/2018 7:36 AM  [name]  has presented today for surgery, with the diagnosis of choledocholithiasis.  The various methods of treatment have been discussed with the patient and family. After consideration of risks, benefits and other options for treatment, the patient has consented to  Procedure(s): ENDOSCOPIC RETROGRADE CHOLANGIOPANCREATOGRAPHY (ERCP) (N/A) as a surgical intervention.  The patient's history has been reviewed, patient examined, no change in status, stable for surgery.  I have reviewed the patient's chart and labs.  Questions were answered to the patient's satisfaction.     Saray Capasso D

## 2018-12-09 NOTE — Op Note (Signed)
[facility] Patient Name: [name] Procedure Date : 12/09/2018 MRN: [id number] Attending MD: Carol Ada , MD Date of Birth: 1951/06/02 CSN: [id number] Age: 67 Admit Type: Inpatient Procedure:                ERCP Indications:              Bile duct stone(s) Providers:                Carol Ada, MD, Vista Lawman, RN, Laverda Sorenson,                            Technician, Vania Rea, CRNA Referring MD:              Medicines:                General Anesthesia Complications:            No immediate complications. Estimated Blood Loss:     Estimated blood loss: none. Procedure:                Pre-Anesthesia Assessment:                           - Prior to the procedure, a History and Physical                            was performed, and patient medications and                            allergies were reviewed. The patient's tolerance of                            previous anesthesia was also reviewed. The risks                            and benefits of the procedure and the sedation                            options and risks were discussed with the patient.                            All questions were answered, and informed consent                            was obtained. Prior Anticoagulants: The patient has                            taken no previous anticoagulant or antiplatelet                            agents. ASA Grade Assessment: II - A patient with                            mild systemic disease. After reviewing the risks  and benefits, the patient was deemed in                            satisfactory condition to undergo the procedure.                           - Sedation was administered by an anesthesia                            professional. General anesthesia was attained.                           After obtaining informed consent, the scope was                            passed under direct vision. Throughout the                         procedure, the patient's blood pressure, pulse, and                            oxygen saturations were monitored continuously. The                            TJF-Q180V ([id number]) Olympus duodenoscope was                            introduced through the mouth, and used to inject                            contrast into and used to inject contrast into the                            bile duct. The ERCP was accomplished without                            difficulty. The patient tolerated the procedure                            well. Scope In: Scope Out: Findings:      The major papilla was normal. The bile duct was deeply cannulated with       the short-nosed traction sphincterotome. Contrast was injected. I       personally interpreted the bile duct images. There was brisk flow of       contrast through the ducts. Image quality was excellent. Contrast       extended to the entire biliary tree. The entire biliary tree was normal.       A short 0.035 inch Soft Jagwire was passed into the biliary tree. A 10       mm biliary sphincterotomy was made with a traction (standard)       sphincterotome using ERBE electrocautery. There was no       post-sphincterotomy bleeding. The biliary tree was swept with a 15 mm       balloon starting at the bifurcation. Nothing was found.  The CBD was cannulated without difficulty during the first attempt. No       manipulation of the PD occurred. The guidewire was secured in the left       intrahpeatic ducts. Contrast injection revealed a midly dilated CBD at       approximately 8-9 mm. During the initial contrast injection there was       the suspicion of a mid CBD filling defect, but this disappeared with       further contrast injection. A 1 cm sphincterotomy was created and the       CBD was swept 3-4 times with the 12-15 extraction balloon. There was no       evidence of any stones with the sweeps. The final occlusion        cholangiogram was negative for any filling defects. Impression:               - The major papilla appeared normal.                           - The cholangiogram was normal.                           - A biliary sphincterotomy was performed.                           - The biliary tree was swept and nothing was found. Recommendation:           - Return patient to hospital ward for ongoing care.                           - Resume regular diet.                           - Restart anticoagulation in 3 days. Procedure Code(s):        --- Professional ---                           [phone], Endoscopic retrograde                            cholangiopancreatography (ERCP); with                            sphincterotomy/papillotomy                           [phone], Endoscopic catheterization of the biliary                            ductal system, radiological supervision and                            interpretation Diagnosis Code(s):        --- Professional ---                           K80.50, Calculus of bile duct without cholangitis  or cholecystitis without obstruction CPT copyright 2019 [employer]. All rights reserved. The codes documented in this report are preliminary and upon coder review may  be revised to meet current compliance requirements. Carol Ada, MD Carol Ada, MD 12/09/2018 8:33:52 AM This report has been signed electronically. Number of Addenda: 0

## 2018-12-09 NOTE — Anesthesia Postprocedure Evaluation (Signed)
Anesthesia Post Note  Patient: BURGANDY HACKWORTH  Procedure(s) Performed: ENDOSCOPIC RETROGRADE CHOLANGIOPANCREATOGRAPHY (ERCP) (N/A ) SPHINCTEROTOMY REMOVAL OF STONES     Patient location during evaluation: PACU Anesthesia Type: General Level of consciousness: awake and alert Pain management: pain level controlled Vital Signs Assessment: post-procedure vital signs reviewed and stable Respiratory status: spontaneous breathing, nonlabored ventilation, respiratory function stable and patient connected to nasal cannula oxygen Cardiovascular status: blood pressure returned to baseline and stable Postop Assessment: no apparent nausea or vomiting Anesthetic complications: no    Last Vitals:  Vitals:   12/09/18 0945 12/09/18 1006  BP: (!) 160/87 (!) 168/82  Pulse: 70 72  Resp: 16   Temp: 36.7 C 36.7 C  SpO2: 100% 95%    Last Pain:  Vitals:   12/09/18 1307  TempSrc:   PainSc: 8                  Jeris Easterly

## 2018-12-10 ENCOUNTER — Encounter (HOSPITAL_COMMUNITY): Payer: Self-pay | Admitting: Gastroenterology

## 2018-12-10 DIAGNOSIS — Z87891 Personal history of nicotine dependence: Secondary | ICD-10-CM | POA: Diagnosis not present

## 2018-12-10 DIAGNOSIS — M1990 Unspecified osteoarthritis, unspecified site: Secondary | ICD-10-CM | POA: Diagnosis not present

## 2018-12-10 DIAGNOSIS — K8050 Calculus of bile duct without cholangitis or cholecystitis without obstruction: Secondary | ICD-10-CM | POA: Diagnosis not present

## 2018-12-10 DIAGNOSIS — K8070 Calculus of gallbladder and bile duct without cholecystitis without obstruction: Secondary | ICD-10-CM | POA: Diagnosis not present

## 2018-12-10 DIAGNOSIS — E7800 Pure hypercholesterolemia, unspecified: Secondary | ICD-10-CM | POA: Diagnosis not present

## 2018-12-10 DIAGNOSIS — Z7901 Long term (current) use of anticoagulants: Secondary | ICD-10-CM | POA: Diagnosis not present

## 2018-12-10 DIAGNOSIS — I10 Essential (primary) hypertension: Secondary | ICD-10-CM | POA: Diagnosis not present

## 2018-12-10 DIAGNOSIS — I480 Paroxysmal atrial fibrillation: Secondary | ICD-10-CM | POA: Diagnosis not present

## 2018-12-10 DIAGNOSIS — Z79899 Other long term (current) drug therapy: Secondary | ICD-10-CM | POA: Diagnosis not present

## 2018-12-10 DIAGNOSIS — K219 Gastro-esophageal reflux disease without esophagitis: Secondary | ICD-10-CM | POA: Diagnosis not present

## 2018-12-10 DIAGNOSIS — R932 Abnormal findings on diagnostic imaging of liver and biliary tract: Secondary | ICD-10-CM | POA: Diagnosis not present

## 2018-12-10 DIAGNOSIS — J45909 Unspecified asthma, uncomplicated: Secondary | ICD-10-CM | POA: Diagnosis not present

## 2018-12-10 MED ORDER — HYDROCODONE-ACETAMINOPHEN 5-325 MG PO TABS: 1.0000 | ORAL_TABLET | Freq: Four times a day (QID) | ORAL | 0 refills | Status: DC | PRN

## 2018-12-10 NOTE — Progress Notes (Signed)
   12/10/18 0714  Clinical Encounter Type  Visited With Patient  Visit Type Initial  Referral From Nurse  Consult/Referral To Chaplain  Spiritual Encounters  Spiritual Needs Prayer  This chaplain responded to the spiritual care consult for Pt. prayer.  The Pt. politely accepted the chaplain's pastoral presence and prayer.  The Pt. shared with the chaplain yesterday was her birthday.  Birthday wishes were extended through the week.  The chaplain is available for F/U spiritual care as needed.

## 2018-12-10 NOTE — Discharge Instructions (Signed)

## 2018-12-10 NOTE — Progress Notes (Signed)
Subjective: No complaints.  Feeling well.  Objective: Vital signs in last 24 hours: Temp:  [98 F (36.7 C)-98.7 F (37.1 C)] 98.5 F (36.9 C) (04/19 0338) Pulse Rate:  [64-79] 64 (04/19 0338) Resp:  [16-20] 16 (04/18 0945) BP: (120-175)/(76-89) 150/77 (04/19 0832) SpO2:  [95 %-100 %] 97 % (04/19 0338) Last BM Date: 12/06/18  Intake/Output from previous day: 04/18 0701 - 04/19 0700 In: 1530 [P.O.:480; I.V.:1050] Out: -  Intake/Output this shift: No intake/output data recorded.  General appearance: alert and no distress GI: tender at the incision sites  Lab Results: No results for input(s): WBC, HGB, HCT, PLT in the last 72 hours. BMET Recent Labs    12/09/18 0308  NA 139  K 3.9  CL 106  CO2 26  GLUCOSE 175*  BUN 6*  CREATININE 0.80  CALCIUM 9.6   LFT Recent Labs    12/09/18 0308  PROT 6.4*  ALBUMIN 3.2*  AST 673*  ALT 644*  ALKPHOS 258*  BILITOT 0.8   PT/INR Recent Labs    12/08/18 0947  LABPROT 13.5  INR 1.0   Hepatitis Panel No results for input(s): HEPBSAG, HCVAB, HEPAIGM, HEPBIGM in the last 72 hours. C-Diff No results for input(s): CDIFFTOX in the last 72 hours. Fecal Lactopherrin No results for input(s): FECLLACTOFRN in the last 72 hours.  Studies/Results: Dg Cholangiogram Operative  Result Date: 12/08/2018 CLINICAL DATA:  Intraoperative cholangiogram EXAM: INTRAOPERATIVE CHOLANGIOGRAM TECHNIQUE: Cholangiographic images from the C-arm fluoroscopic device were submitted for interpretation post-operatively. Please see the procedural report for the amount of contrast and the fluoroscopy time utilized. COMPARISON:  None. FINDINGS: Contrast fills the biliary tree and duodenum. The common bile duct is dilated. There are round filling defects in the distal common bile duct. IMPRESSION: Findings are worrisome for distal common bile duct stones. Contrast does traverse across the common bile duct into the duodenum. Electronically Signed   By: Marybelle Killings M.D.   On: 12/08/2018 13:55   Dg Ercp Biliary & Pancreatic Ducts  Result Date: 12/09/2018 CLINICAL DATA:  68 year old female with choledocholithiasis undergoing ERCP EXAM: ERCP TECHNIQUE: Multiple spot images obtained with the fluoroscopic device and submitted for interpretation post-procedure. FLUOROSCOPY TIME:  Fluoroscopy Time:  2 minutes 24 seconds COMPARISON:  Intraoperative cholangiogram 12/08/2018 FINDINGS: A total of 4 intraoperative spot radiographs are submitted for review. The images demonstrate a flexible endoscope in the descending duodenum with wire cannulation of the common bile duct. Cholangiogram demonstrates mild biliary ductal dilatation. Subsequent images confirm sphincterotomy and suggest balloon sweeping of the common duct. IMPRESSION: ERCP as above. These images were submitted for radiologic interpretation only. Please see the procedural report for the amount of contrast and the fluoroscopy time utilized. Electronically Signed   By: Jacqulynn Cadet M.D.   On: 12/09/2018 08:47    Medications:  Scheduled: . budesonide  0.25 mg Nebulization BID  . diltiazem  120 mg Oral BID  . docusate sodium  100 mg Oral BID  . feeding supplement  1 Container Oral TID BM  . indomethacin  100 mg Rectal Once  . loratadine  10 mg Oral Daily  . losartan  50 mg Oral Daily  . montelukast  10 mg Oral QHS  . polyethylene glycol  17 g Oral Daily  . umeclidinium bromide  1 puff Inhalation Daily   Continuous: . dextrose 5 % and 0.9 % NaCl with KCl 20 mEq/L 75 mL/hr at 12/09/18 1825  . famotidine (PEPCID) IV 20 mg (12/09/18 1717)  Assessment/Plan: 1) ? Choledocholithiasis s/p ERCP with clearing of the duct. 2) S/p lap chole.   The patient feels well.  Just prior to the ERCP she still had her biliary pain, but post ERCP she reports a resolution of that symptoms.  There was no clear stone with the ERCP, but it may be that a small stone was not visualized and passed quickly with the sweep  of the CBD.  Plan: 1) Signing off. 2) If she needs any further GI care, she can follow up with [facility].  LOS: 0 days   [name] D 12/10/2018, 9:15 AM

## 2018-12-10 NOTE — Progress Notes (Signed)
Pt discharged home with family member, w/c with staff member to front of hospital

## 2018-12-10 NOTE — TOC Initial Note (Signed)
Transition of Care [facility]) - Initial/Assessment Note    Patient Details  Name: [name] MRN: [id number] Date of Birth: 1950/10/11  Transition of Care [facility]) CM/SW Contact:    Claudie Leach, RN Phone Number: 12/10/2018, 8:33 AM  Clinical Narrative:       Pt presented for ongoing abdominal pain due to gallstones.  Pt states she lives alone and is independent with ADLs.  She does not use DME or have any in-home services.  Pt uses Medicaid transportation to go to MD appointments and pharmacy.  Pt states she has no trouble obtaining regular medicines and sees Internal Medicine for PCP.  Pt states she has daughters that live nearby and "check" on her.              Expected Discharge Plan: Home/Self Care Barriers to Discharge: No Barriers Identified   Patient Goals and CMS Choice Patient states their goals for this hospitalization and ongoing recovery are:: to have no more pain      Expected Discharge Plan and Services Expected Discharge Plan: Home/Self Care In-house Referral: NA     Living arrangements for the past 2 months: Apartment Expected Discharge Date: 12/10/18                        Prior Living Arrangements/Services Living arrangements for the past 2 months: Apartment Lives with:: Self   Do you feel safe going back to the place where you live?: Yes      Need for Family Participation in Patient Care: Yes (Comment) Care giver support system in place?: Yes (comment) Current home services: Other (comment)(Medicaid transportation) Criminal Activity/Legal Involvement Pertinent to Current Situation/Hospitalization: No - Comment as needed  Activities of Daily Living Home Assistive Devices/Equipment: None ADL Screening (condition at time of admission) Patient's cognitive ability adequate to safely complete daily activities?: Yes Is the patient deaf or have difficulty hearing?: No Does the patient have difficulty seeing, even when wearing glasses/contacts?: No Does the  patient have difficulty concentrating, remembering, or making decisions?: No Patient able to express need for assistance with ADLs?: Yes Does the patient have difficulty dressing or bathing?: No Independently performs ADLs?: Yes (appropriate for developmental age) Does the patient have difficulty walking or climbing stairs?: No Weakness of Legs: None Weakness of Arms/Hands: None  Permission Sought/Granted                  Emotional Assessment Appearance:: Appears stated age Attitude/Demeanor/Rapport: Gracious Affect (typically observed): Accepting Orientation: : Oriented to Place, Oriented to Self, Oriented to  Time Alcohol / Substance Use: Not Applicable Psych Involvement: No (comment)  Admission diagnosis:  gallstones Patient Active Problem List   Diagnosis Date Noted  . Gallstones 12/08/2018  . Choledocholithiasis   . Vaginal discharge 10/11/2018  . Vaginal bleeding 10/11/2018  . History of radiofrequency ablation (RFA) procedure for cardiac arrhythmia   . Asthma exacerbation 08/28/2018  . Chronic congestion of paranasal sinus 06/14/2018  . Primary osteoarthritis involving multiple joints 06/14/2018  . Acute pain of left knee 05/15/2018  . Hyperpigmented skin lesion 09/09/2017  . Osteoporosis without current pathological fracture 04/15/2017  . Normocytic anemia 10/19/2016  . Hyperlipidemia 03/18/2016  . Special screening for malignant neoplasms, colon 03/17/2016  . Cervical cancer screening 03/17/2016  . Assistance with transportation 03/17/2016  . HTN (hypertension), benign   . GERD (gastroesophageal reflux disease)   . PAF (paroxysmal atrial fibrillation) ([place]) 11/26/2015  . Chronic anticoagulation 11/26/2015  . Marijuana abuse  06/11/2015   PCP:  [name], MD Pharmacy:   [facility] [address] [place] [address] Phone: [phone] Fax:  [phone]

## 2018-12-10 NOTE — Discharge Summary (Signed)
Physician Discharge Summary  Patient ID: [name] MRN: [id number] DOB/AGE: 05-16-1951 68 y.o.  PCP: [name], MD  Admit date: 12/08/2018 Discharge date: 12/10/2018  Admission Diagnoses:  Cholecystitis with choledocholithiasis  Discharge Diagnoses:  same  Active Problems:   Gallstones   Choledocholithiasis   Surgery:  Lap chole with IOC the ERCP  Discharged Condition: improved  Hospital Course:   Had surgery on Friday by Dr. Marlou Starks and [facility] showed a distal CBD stone. She underwent successful ERCP on Saturday and was ready for discharge on Sunday.     Consults: Carol Ada  Significant Diagnostic Studies: ERCP    Discharge Exam: Blood pressure 140/77, pulse 64, temperature 98.5 F (36.9 C), temperature source Oral, resp. rate 16, height 5\' 7"  (1.702 m), weight 67.1 kg, SpO2 97 %. Incisions OK  Disposition: Discharge disposition: 01-Home or Self Care       Discharge Instructions    Call MD for:  severe uncontrolled pain   Complete by:  As directed    Diet - low sodium heart healthy   Complete by:  As directed    Diet general   Complete by:  As directed    Advance diet as tolerated   Increase activity slowly   Complete by:  As directed      Allergies as of 12/10/2018      Reactions   Albuterol Palpitations   Caused afib    Percocet [oxycodone-acetaminophen] Shortness Of Breath, Itching, Anxiety   Makes jittery, difficulty breathing, itching   Celecoxib Rash   Protonix [pantoprazole Sodium] Palpitations      Medication List    STOP taking these medications   fluconazole 150 MG tablet Commonly known as:  DIFLUCAN   metroNIDAZOLE 500 MG tablet Commonly known as:  FLAGYL     TAKE these medications   diltiazem 120 MG 24 hr capsule Commonly known as:  Cartia XT Take 1 capsule (120 mg total) by mouth 2 (two) times daily.   diltiazem 60 MG tablet Commonly known as:  CARDIZEM Take 60 mg by mouth every 6 (six) hours as needed (Rapid AFIB over  100).   fluticasone 44 MCG/ACT inhaler Commonly known as:  FLOVENT HFA Inhale 1 puff into the lungs daily.   HYDROcodone-acetaminophen 5-325 MG tablet Commonly known as:  NORCO/VICODIN Take 1 tablet by mouth every 6 (six) hours as needed.   levalbuterol 45 MCG/ACT inhaler Commonly known as:  XOPENEX HFA INHALE 1 PUFF INTO LUNGS EVERY 6 HOURS AS NEEDED FOR SHORTNESS OF BREATH What changed:  See the new instructions.   linaclotide 145 MCG Caps capsule Commonly known as:  Linzess Take 1 capsule (145 mcg total) by mouth daily before breakfast.   losartan 50 MG tablet Commonly known as:  COZAAR TAKE 1 TABLET(50 MG) BY MOUTH DAILY What changed:  See the new instructions.   magnesium hydroxide 400 MG/5ML suspension Commonly known as:  Milk of Magnesia Take 15-30 mLs by mouth at bedtime as needed for mild constipation.   montelukast 10 MG tablet Commonly known as:  SINGULAIR Take 1 tablet (10 mg total) by mouth at bedtime.   multivitamin with minerals Tabs tablet Take 1 tablet by mouth every other day.   ondansetron 4 MG disintegrating tablet Commonly known as:  Zofran ODT Take 1 tablet (4 mg total) by mouth every 8 (eight) hours as needed for nausea or vomiting.   potassium chloride SA 20 MEQ tablet Commonly known as:  K-DUR Take 1 tablet (20 mEq total)  by mouth 2 (two) times daily for 3 days.   rivaroxaban 20 MG Tabs tablet Commonly known as:  XARELTO Take 1 tablet (20 mg total) by mouth daily with supper.   sucralfate 1 g tablet Commonly known as:  Carafate Take 1 tablet (1 g total) by mouth 4 (four) times daily.   tiotropium 18 MCG inhalation capsule Commonly known as:  SPIRIVA Place 1 capsule (18 mcg total) into inhaler and inhale daily.   triamcinolone cream 0.1 % Commonly known as:  KENALOG Apply 1 application topically 2 (two) times daily. What changed:  when to take this      Follow-up Information    [name], MD. Schedule an appointment as soon  as possible for a visit in 3 week(s).   Specialty:  General Surgery Contact information: [place] [address] [phone]           Signed: Pedro Earls 12/10/2018, 8:24 AM

## 2018-12-10 NOTE — Care Management Obs Status (Signed)
[facility] NOTIFICATION   Patient Details  Name: NAILAH LUEPKE MRN: [id number] Date of Birth: 10/11/1950   Medicare Observation Status Notification Given:  Yes    Claudie Leach, RN 12/10/2018, 8:30 AM

## 2018-12-11 ENCOUNTER — Encounter (HOSPITAL_COMMUNITY): Payer: Self-pay | Admitting: General Surgery

## 2018-12-11 ENCOUNTER — Other Ambulatory Visit: Payer: Self-pay | Admitting: *Deleted

## 2018-12-11 ENCOUNTER — Other Ambulatory Visit: Payer: Self-pay

## 2018-12-11 NOTE — Patient Outreach (Addendum)
[facility]) Care Management  12/11/2018  [name] 02/01/1951 [id number]   UM referral Screen/transition of care   Referral Date:12/05/18 Referral Source:UM referral Referral Reason:12/05/18 [name]- Phone [phone]  03 ER visits within 6 months Last ED visit on 12/02/18  Insurance:[employer] medicare  This patient is noted to have been engaged with [facility] ( SW, Health coach and community RN CM) in 2019 and January 2020  Outreach attempt # 3 to [phone] Patient is able to verify HIPAA Reviewed and addressed EMMI red alert/referral to [facility] with patient   Transition of care assessment completed as [name] was discharged from [facility] on 12/10/18 choledocholithiasis s/p ERCP with clearing of the duct [name] reports "feeling better since gall surgery" She confirms 15 ED visits for s/s related to pain r/t to gall stones and  "I was eating wrong things" CM encouraged her to follow the recommended diet at d/c, low sodium heart healthy, advance as tolerated. She reports intake of soup and apple sauce tolerable at this time  She reports her s/s at this time only includes some nausea ans soreness  She reports she spoke with the surgeon office staff today about her medications. She states they discussed her returning on Jan 01 2019 for her 3 week f/u appointment. She reports she will be using united [employer] care transportation to get to this medical appointment.   She reports less use of pain medications  She reports "I really think I am going to be okay now" when [facility] RN CM, SW, pharmacy and RN CM health coach services discussed and offered. She refused the offer of [facility] services   Social: [name] lives alone but has support of her daughter. She states she is independent with her care needs and uses united healthcare transportation services to get to medical appointments   Conditions:12/09/18 s/p ERCP (endoscopic retrograde cholangiopancreatography,  sphincterotomy, removal of gall stones, PAF, HTN, Asthma, GERD, Marijuana abuse, chronic anticoagulation, HDL, normocytic anemia, pain of left knee, hx of radiofrequency ablation, for cardiac arrhythmia osteoporosis, chronic congestion of paranasal   Medications: She denies concerns with taking medications as prescribed, affording medications, side effects of medications and questions about medications  Appointments: She is to see Shelby Marlou Starks surgeon on 01/01/19  Advance Directives: Denies need for assist with advance directives    Consent: [facility] RN CM reviewed [facility] services with patient. Patient gave verbal consent for services.   Plan: [facility] RN CM will close case at this time as patient has been assessed and no needs identified/needs resolved.   Pt encouraged to return a call to [facility] RN CM prn CM provided [facility] RN CM and nurse call center's numbers She also reports having united healthcare nurse line number   [facility] RN CM sent a successful outreach letter as discussed with [facility] brochure enclosed for review  Routed note to listed primary MD, [name] and Shelby Kyung Rudd L. Lavina Hamman, RN, BSN, [place] Coordinator Office number [phone] Mobile number [phone]  Main [facility] number [phone] Fax number [phone]

## 2018-12-12 NOTE — Addendum Note (Signed)
Addendum  created 12/12/18 0849 by Janeece Riggers, MD   Clinical Note Signed

## 2018-12-12 NOTE — Anesthesia Postprocedure Evaluation (Signed)
Anesthesia Post Note  Patient: [name]  Procedure(s) Performed: LAPAROSCOPIC CHOLECYSTECTOMY WITH INTRAOPERATIVE CHOLANGIOGRAM (N/A Abdomen)     Patient location during evaluation: PACU Anesthesia Type: General Level of consciousness: awake and alert Pain management: pain level controlled Vital Signs Assessment: post-procedure vital signs reviewed and stable Respiratory status: spontaneous breathing, nonlabored ventilation, respiratory function stable and patient connected to nasal cannula oxygen Cardiovascular status: blood pressure returned to baseline and stable Postop Assessment: no apparent nausea or vomiting Anesthetic complications: no    Last Vitals:  Vitals:   12/10/18 0338 12/10/18 0832  BP: 140/77 (!) 150/77  Pulse: 64   Resp:    Temp: 36.9 C   SpO2: 97%     Last Pain:  Vitals:   12/10/18 0933  TempSrc:   PainSc: 5                  Shmiel Morton

## 2018-12-12 NOTE — Anesthesia Postprocedure Evaluation (Signed)
Anesthesia Post Note  Patient: [name]  Procedure(s) Performed: LAPAROSCOPIC CHOLECYSTECTOMY WITH INTRAOPERATIVE CHOLANGIOGRAM (N/A Abdomen)     Patient location during evaluation: PACU Anesthesia Type: General Level of consciousness: awake and alert Pain management: pain level controlled Vital Signs Assessment: post-procedure vital signs reviewed and stable Respiratory status: spontaneous breathing, nonlabored ventilation, respiratory function stable and patient connected to nasal cannula oxygen Cardiovascular status: blood pressure returned to baseline and stable Postop Assessment: no apparent nausea or vomiting Anesthetic complications: no    Last Vitals:  Vitals:   12/10/18 0338 12/10/18 0832  BP: 140/77 (!) 150/77  Pulse: 64   Resp:    Temp: 36.9 C   SpO2: 97%     Last Pain:  Vitals:   12/10/18 0933  TempSrc:   PainSc: 5                  Terri Malerba

## 2018-12-21 ENCOUNTER — Ambulatory Visit: Payer: Medicare Other | Admitting: Neurology

## 2018-12-25 ENCOUNTER — Other Ambulatory Visit: Payer: Self-pay | Admitting: *Deleted

## 2018-12-25 NOTE — Patient Outreach (Signed)
[facility]) Care Management  12/25/2018  LILIANAH BUFFIN 07-13-1951 [id number]   Care coordination  Ms Amster called [facility] RN CM to update her new contact number to [phone] She reports taht she continues to feel very well except some pain at intervals of her neck/throat area after "they had to go down my throat to get the rest of the gall stones.", "he told me it would be sore for a period of time." referring to the surgeon She reports she is prefers not to use her pain medication therefore RN CM discussed use of warm heat to the area and she agreed to attempt alternative treatment Again CM provided her with the nurse call center number for assistance prn  She was encouraged to contact [facility] RN CM prn  She again confirm she did not need [facility] community RN CM, pharmacy nor Health coach services  Kimberly L. Lavina Hamman, RN, BSN, [place] Coordinator Office number [phone] Mobile number [phone]  Main [facility] number [phone] Fax number [phone]

## 2018-12-30 ENCOUNTER — Other Ambulatory Visit: Payer: Self-pay | Admitting: Internal Medicine

## 2019-01-01 ENCOUNTER — Other Ambulatory Visit: Payer: Self-pay

## 2019-01-01 ENCOUNTER — Other Ambulatory Visit: Payer: Self-pay | Admitting: Internal Medicine

## 2019-01-01 ENCOUNTER — Other Ambulatory Visit (HOSPITAL_COMMUNITY): Payer: Self-pay | Admitting: *Deleted

## 2019-01-01 DIAGNOSIS — J45901 Unspecified asthma with (acute) exacerbation: Secondary | ICD-10-CM | POA: Diagnosis not present

## 2019-01-01 DIAGNOSIS — J4520 Mild intermittent asthma, uncomplicated: Secondary | ICD-10-CM | POA: Diagnosis not present

## 2019-01-01 MED ORDER — LOSARTAN POTASSIUM 50 MG PO TABS: ORAL_TABLET | ORAL | 1 refills | Status: DC

## 2019-01-01 MED ORDER — RIVAROXABAN 20 MG PO TABS: 20.0000 mg | ORAL_TABLET | Freq: Every day | ORAL | 6 refills | Status: DC

## 2019-01-01 MED ORDER — TIOTROPIUM BROMIDE MONOHYDRATE 18 MCG IN CAPS: 18.0000 ug | ORAL_CAPSULE | Freq: Every day | RESPIRATORY_TRACT | 1 refills | Status: DC

## 2019-01-01 NOTE — Telephone Encounter (Signed)
Needs refill on losartan (COZAAR) 50 MG tablet  tiotropium (SPIRIVA) 18 MCG inhalation capsule ;pt contact [place], [place]

## 2019-01-01 NOTE — Telephone Encounter (Signed)
tiotropium (SPIRIVA) 18 MCG inhalation capsule   losartan (COZAAR) 50 MG tablet, refill request @  [facility] [place], [place] AT [address] [phone] (Phone) [phone] (Fax)

## 2019-01-01 NOTE — Telephone Encounter (Signed)
Refill requests have been sent to PCP today through [place]. Hubbard Hartshorn, RN, BSN

## 2019-01-04 ENCOUNTER — Other Ambulatory Visit: Payer: Self-pay | Admitting: Internal Medicine

## 2019-01-04 NOTE — Telephone Encounter (Signed)
Needs refill on HYDROcodone-acetaminophen (NORCO/VICODIN) 5-325 MG tablet  [id number] - [place], [place] AT [address] ;pt contact [phone]

## 2019-01-04 NOTE — Telephone Encounter (Signed)
Last rx written 12/10/18. Last OV 2/3 with Dr Reesa Chew; 2/18 with Dr Alfonse Spruce. Next OV no f/u appt has been scheduled. UDS 11/26/15.

## 2019-01-05 ENCOUNTER — Telehealth: Payer: Self-pay | Admitting: Internal Medicine

## 2019-01-05 ENCOUNTER — Telehealth: Payer: Self-pay

## 2019-01-05 NOTE — Telephone Encounter (Signed)
Pt is requesting Dr Reesa Chew; pt contact [phone]

## 2019-01-05 NOTE — Telephone Encounter (Signed)
She already had her cholecystectomy, should not be using pain medications anymore. Needs a follow-up appointment in clinic before prescribing anything else.

## 2019-01-05 NOTE — Telephone Encounter (Signed)
Pt called / informed "She already had her cholecystectomy, should not be using pain medications anymore." per Dr Reesa Chew. Stated ok and did not want to schedule an appt; stated "I'm alright".

## 2019-01-05 NOTE — Telephone Encounter (Signed)
Requesting to speak with a nurse about pain meds. Please call back.

## 2019-01-10 ENCOUNTER — Other Ambulatory Visit: Payer: Self-pay | Admitting: Internal Medicine

## 2019-01-10 NOTE — Telephone Encounter (Signed)
Pt requesting Medication refill   montelukast (SINGULAIR) 10 MG tablet   [id number] - [place], [address] AT [place]

## 2019-01-14 IMAGING — CT CT HEART MORPH/PULM VEIN W/ CM & W/O CA SCORE
1 of 2 series · 3 of 10 positions shown, 4 images · non-contrast
Comparison: 05/29/2017

CLINICAL DATA: Atrial fibrillation scheduled for an ablation.

EXAM:
Cardiac CT/CTA
TECHNIQUE: The patient was scanned on a Siemens Somatom scanner.

[Series 206: (person_name) - pulmonary veins · 0.35mm/px · 3 of 5 slices shown, 4 images]
[im 2/5  vessel]
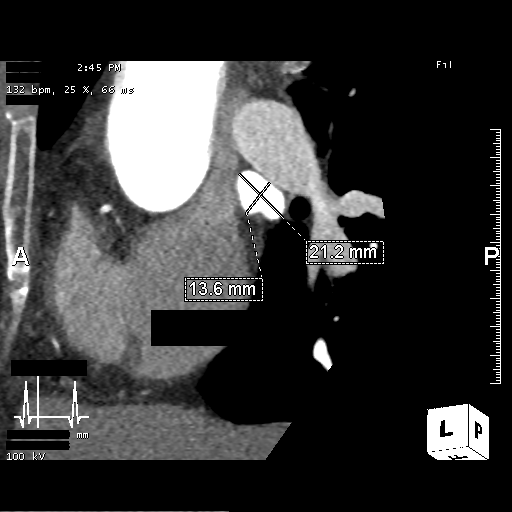
[im 2/5  lung]
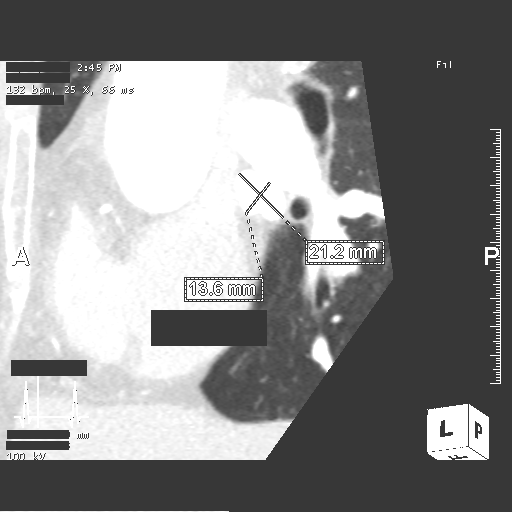
[im 3/5  vessel]
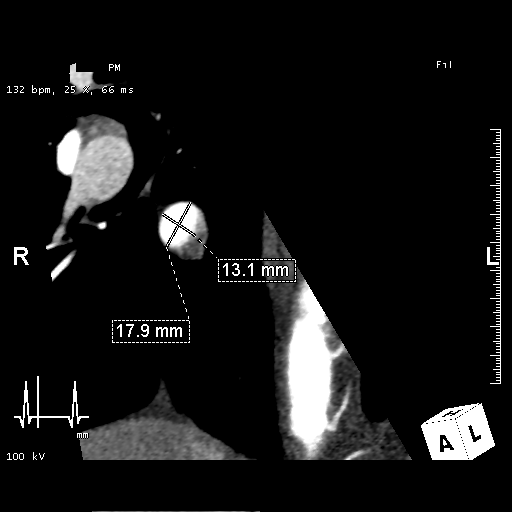
[im 4/5  vessel]
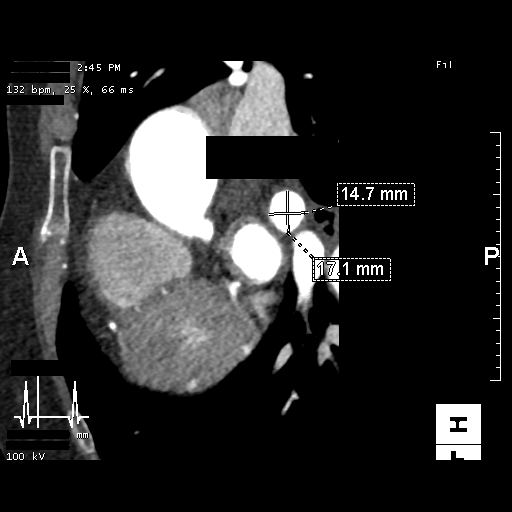

[3 of 10 positions shown; findings below may reference images not displayed]

FINDINGS: A 120 kV prospective scan was triggered in the descending thoracic
aorta at 111 HU's. Gantry rotation speed was 280 msecs and
collimation was .9 mm. No beta blockade and no NTG was given. The 3D
data set was reconstructed in 5% intervals of the 60-80 % of the R-R
cycle. Diastolic phases were analyzed on a dedicated work station
using MPR, MIP and VRT modes. The patient received 80 cc of
contrast.

There is normal pulmonary vein drainage into the left atrium (2 on
the right and 2 on the left) with ostial measurements as follows:

RUPV: 21.2 x 13.6 mm

RLPV: 17.9 x 13.1 mm

LUPV: 18.9 x 12.9 mm

LLPV: 17.8 x 12.9 mm

The left atrial appendage is large with chicken wing morphology and
one major lobe, ostial size 24 x 18 mm and length 40 mm. There is no
thrombus in the left atrial appendage.

The esophagus runs to the left from the left atrial midline and is
in the proximity to the ostia of LUPV and LLPV.

Aorta:  Normal caliber.  No dissection or calcifications.

Aortic Valve:  Trileaflet.  No calcifications.

Coronary Arteries: Normal coronary origin. Right dominance. The
study was performed without use of NTG and insufficient for plaque
evaluation.

Calcium score is 32 that represents 63 percentile/age/sex.
IMPRESSION: 1. There is normal pulmonary vein drainage into the left atrium.

2. The left atrial appendage is large with chicken wing morphology
and one major lobe, ostial size 24 x 18 mm and length 40 mm. There
is no thrombus in the left atrial appendage.

3. The esophagus runs to the left from the left atrial midline and
is in the proximity to the ostia of LUPV and LLPV.

4. Normal coronary origin. Right dominance. Calcium score is 32 that
represents 63 percentile/age/sex. The study was performed without
use of NTG and insufficient for plaque evaluation. However, proximal
portions of the coronary arteries have no significant plaque.

5. Pulmonary artery is dilated measuring 34 mm suggestive of
pulmonary hypertension.

EXAM:
OVER-READ INTERPRETATION  CT CHEST

The following report is an over-read performed by radiologist Dr.
Rtoyota Joshjax [[email or web address]] on 09/28/2017. This over-read
does not include interpretation of cardiac or coronary anatomy or
pathology. The coronary CTA interpretation by the cardiologist is
attached.
FINDINGS: Vascular: Heart is normal size.  Aorta is normal caliber.

Mediastinum/Nodes: No adenopathy in the lower mediastinum or hila.
Small hiatal hernia.

Lungs/Pleura: Visualized lungs are clear.  No effusions.

Upper Abdomen: Imaging into the upper abdomen shows no acute
findings.

Musculoskeletal: Chest wall soft tissues are unremarkable. No acute
bony abnormality.
IMPRESSION: No acute extra cardiac abnormality.

Small hiatal hernia.

## 2019-01-25 ENCOUNTER — Inpatient Hospital Stay (HOSPITAL_COMMUNITY): Admission: RE | Admit: 2019-01-25 | Payer: Self-pay | Source: Ambulatory Visit | Admitting: Nurse Practitioner

## 2019-01-28 IMAGING — CR DG CHEST 2V
2 series · 2 of 2 positions shown · non-contrast
Comparison: 08/21/2017

CLINICAL DATA: Productive cough, fever

EXAM:
CHEST  2 VIEW

[chest pa]
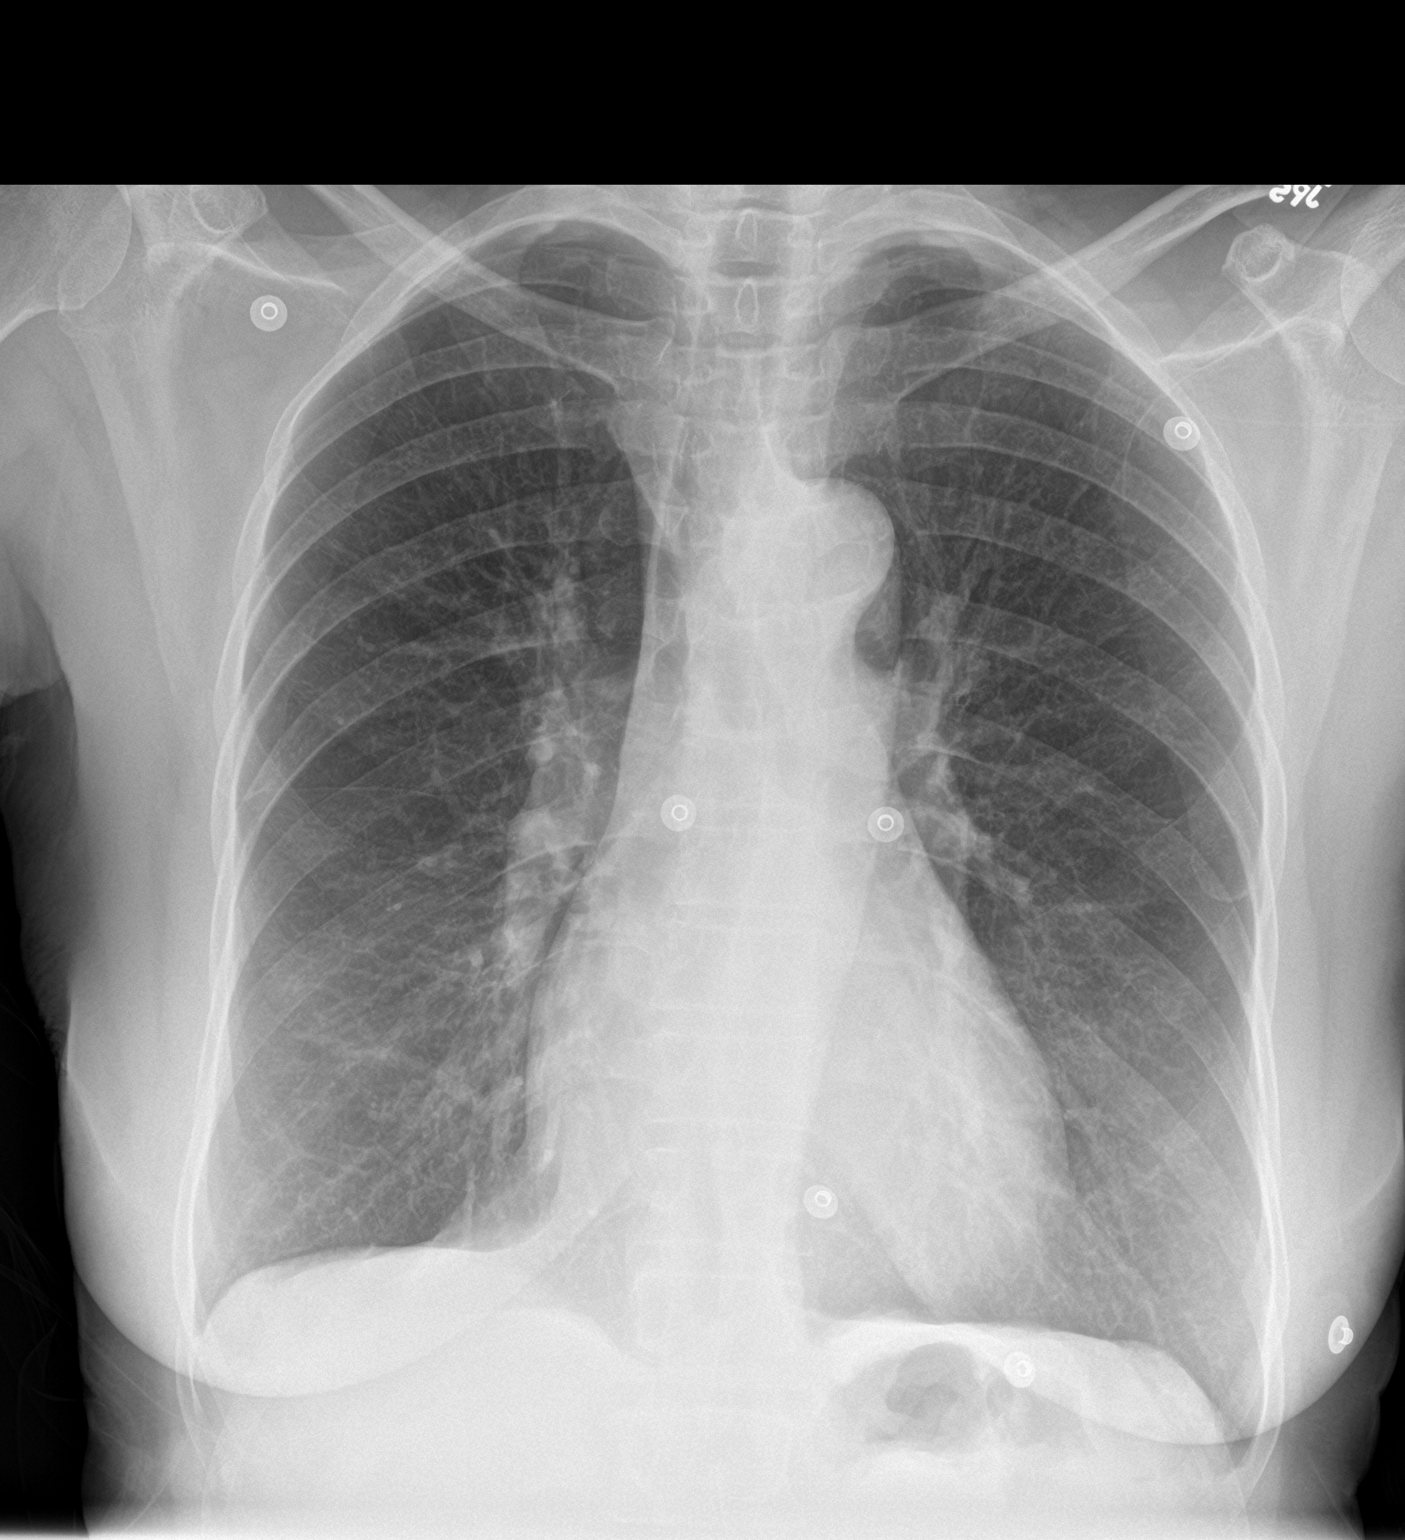

[chest lat]
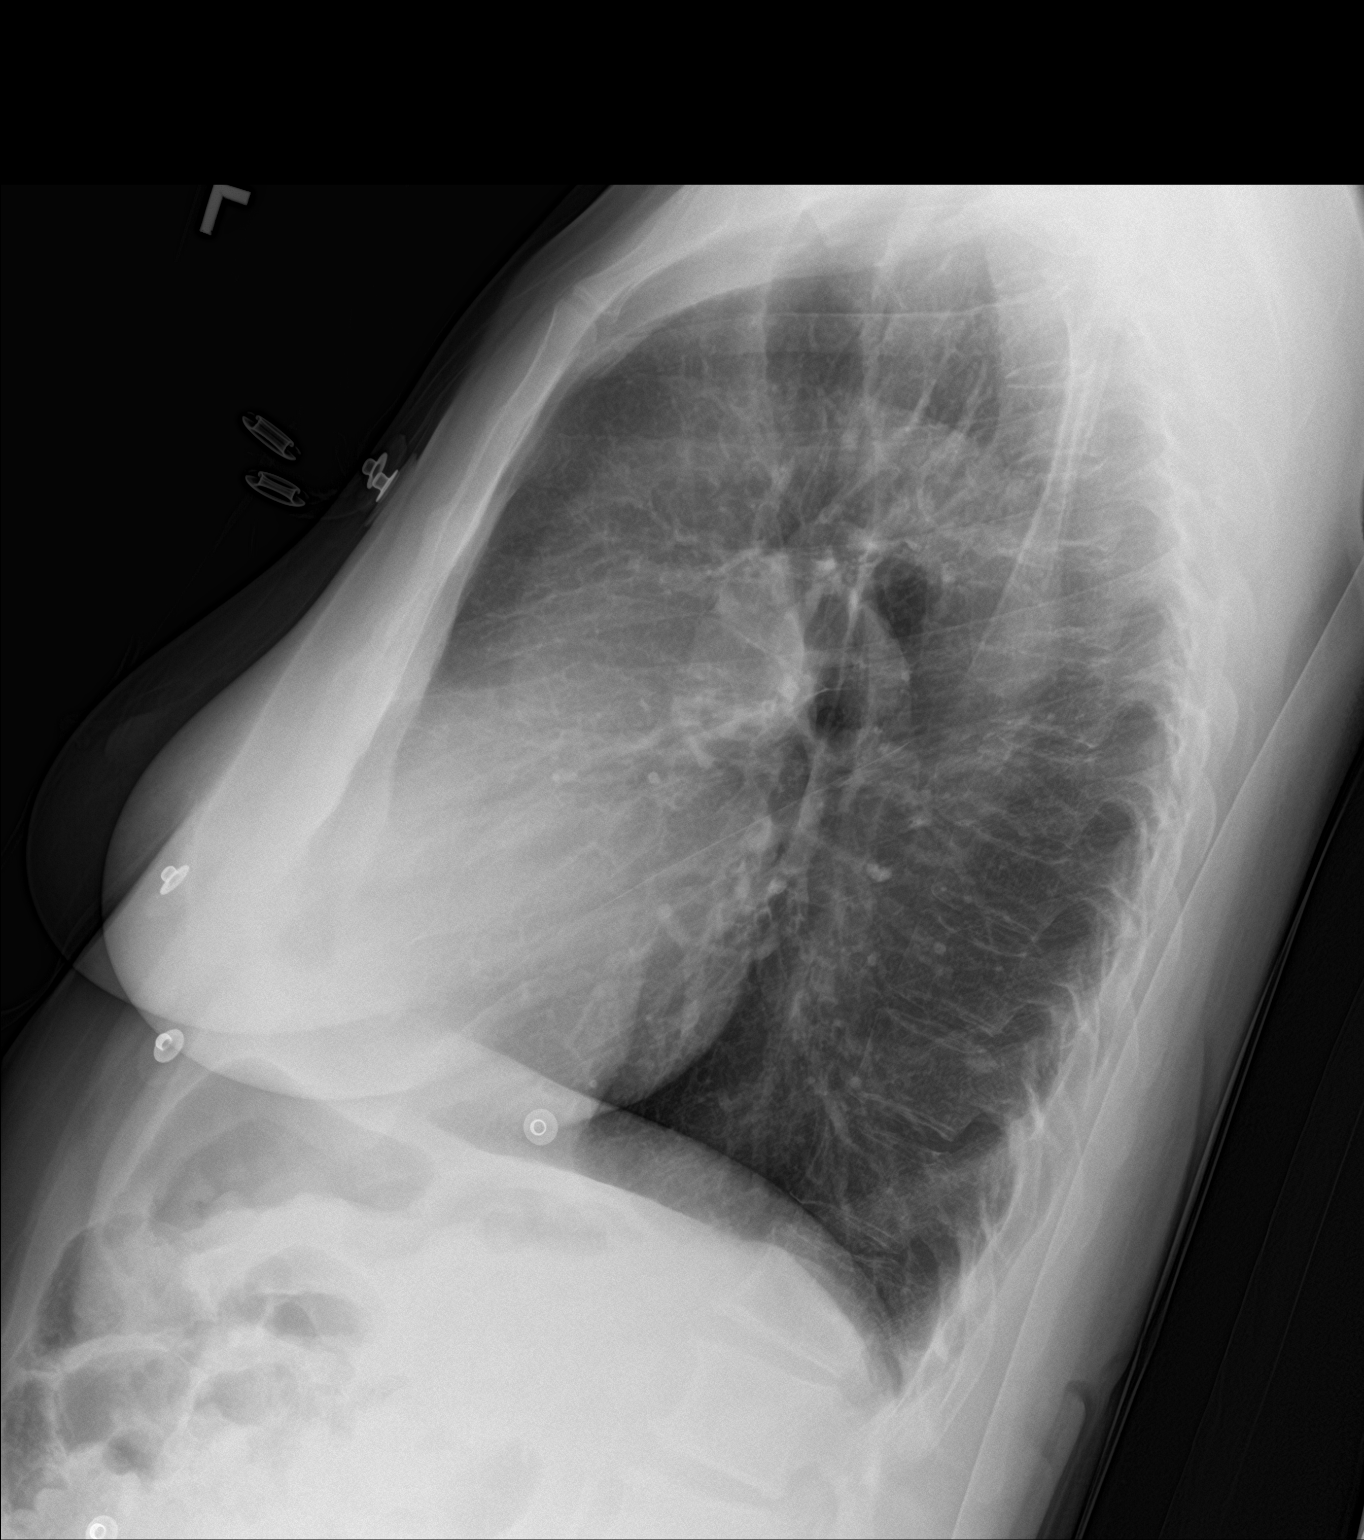

[2 of 2 positions shown; findings below may reference images not displayed]

FINDINGS: There is hyperinflation of the lungs compatible with COPD. Heart is
upper limits normal in size. No confluent airspace opacities or
effusions. No acute bony abnormality.
IMPRESSION: COPD.  No active disease.

## 2019-01-29 ENCOUNTER — Encounter (HOSPITAL_COMMUNITY): Payer: Self-pay | Admitting: Nurse Practitioner

## 2019-01-29 ENCOUNTER — Other Ambulatory Visit: Payer: Self-pay

## 2019-01-29 ENCOUNTER — Ambulatory Visit (HOSPITAL_COMMUNITY)
Admission: RE | Admit: 2019-01-29 | Discharge: 2019-01-29 | Disposition: A | Discharge status: Home / Self Care | Payer: Medicare Other | Source: Ambulatory Visit | Attending: Nurse Practitioner | Admitting: Nurse Practitioner

## 2019-01-29 VITALS — BP 142/72 | HR 76 | Ht 67.0 in | Wt 147.6 lb

## 2019-01-29 DIAGNOSIS — Z79899 Other long term (current) drug therapy: Secondary | ICD-10-CM | POA: Diagnosis not present

## 2019-01-29 DIAGNOSIS — K219 Gastro-esophageal reflux disease without esophagitis: Secondary | ICD-10-CM | POA: Insufficient documentation

## 2019-01-29 DIAGNOSIS — I10 Essential (primary) hypertension: Secondary | ICD-10-CM | POA: Diagnosis not present

## 2019-01-29 DIAGNOSIS — I480 Paroxysmal atrial fibrillation: Secondary | ICD-10-CM | POA: Diagnosis not present

## 2019-01-29 DIAGNOSIS — Z7901 Long term (current) use of anticoagulants: Secondary | ICD-10-CM | POA: Insufficient documentation

## 2019-01-29 DIAGNOSIS — J45909 Unspecified asthma, uncomplicated: Secondary | ICD-10-CM | POA: Diagnosis not present

## 2019-01-29 DIAGNOSIS — Z87891 Personal history of nicotine dependence: Secondary | ICD-10-CM | POA: Diagnosis not present

## 2019-01-29 NOTE — Progress Notes (Signed)
Primary Care Physician: [name], MD Referring 77 f/u EP: [name] is a 68 y.o. female with a h/o paroxysmal afib, s/p ablation, 11/2015, asthma seasonal allergies, that is in the afib clinic for f/u. She has had hr GB removed sine I saw her last. Currently she is not having any problems, no afib awareness and is doing well.   Today, she denies symptoms of palpitations, chest pain, shortness of breath, orthopnea, PND, lower extremity edema, dizziness, presyncope, syncope, or neurologic sequela. The patient is tolerating medications without difficulties and is otherwise without complaint today.   Past Medical History:  Diagnosis Date  . A-fib ([place])   . Allergy    seasonal  . Asthma   . Chronic anticoagulation   . Eczema   . GERD (gastroesophageal reflux disease)   . History of kidney stones   . Hypertension   . PAF (paroxysmal atrial fibrillation) ([place]) 11/26/2015   April/May 2017: Multiple ED visits for A. Fib.  02/12/16: Improved on diltiazem IV. Cardiology consulted in the ED and discharge patient with diltiazem XR 120 mg once daily with 60 mg by mouth every 6 hours as needed for symptomatic palpitations, flecainide 50 mg twice daily. Referred to the atrial fibrillation clinic.  02/17/16: Seen in the atrial fibrillation clinic by NP Roderic Palau. Normal sin   Past Surgical History:  Procedure Laterality Date  . ATRIAL FIBRILLATION ABLATION N/A 09/30/2017   Procedure: ATRIAL FIBRILLATION ABLATION;  Surgeon: Thompson Grayer, MD;  Location: [facility];  Service: Cardiovascular;  Laterality: N/A;  . CHOLECYSTECTOMY N/A 12/08/2018   Procedure: LAPAROSCOPIC CHOLECYSTECTOMY WITH INTRAOPERATIVE CHOLANGIOGRAM;  Surgeon: Jovita Kussmaul, MD;  Location: [facility];  Service: General;  Laterality: N/A;  . ERCP N/A 12/09/2018   Procedure: ENDOSCOPIC RETROGRADE CHOLANGIOPANCREATOGRAPHY (ERCP);  Surgeon: Carol Ada, MD;  Location: [facility];  Service: Endoscopy;   Laterality: N/A;  . SPHINCTEROTOMY  12/09/2018   Procedure: SPHINCTEROTOMY;  Surgeon: Carol Ada, MD;  Location: [facility];  Service: Endoscopy;;  . TONSILLECTOMY    . TUBAL LIGATION      Current Outpatient Medications  Medication Sig Dispense Refill  . diltiazem (CARDIZEM) 60 MG tablet Take 60 mg by mouth every 6 (six) hours as needed (Rapid AFIB over 100).    Marland Kitchen diltiazem (CARTIA XT) 120 MG 24 hr capsule Take 1 capsule (120 mg total) by mouth 2 (two) times daily. 180 capsule 2  . fluticasone (FLOVENT HFA) 44 MCG/ACT inhaler Inhale 1 puff into the lungs daily.     Marland Kitchen HYDROcodone-acetaminophen (NORCO/VICODIN) 5-325 MG tablet Take 1 tablet by mouth every 6 (six) hours as needed. 20 tablet 0  . levalbuterol (XOPENEX HFA) 45 MCG/ACT inhaler INHALE 1 PUFF INTO LUNGS EVERY 6 HOURS AS NEEDED FOR SHORTNESS OF BREATH (Patient taking differently: Inhale 1 puff into the lungs every 6 (six) hours as needed for shortness of breath. ) 15 g 1  . linaclotide (LINZESS) 145 MCG CAPS capsule Take 1 capsule (145 mcg total) by mouth daily before breakfast. 30 capsule 1  . losartan (COZAAR) 50 MG tablet TAKE 1 TABLET(50 MG) BY MOUTH DAILY 90 tablet 1  . magnesium hydroxide (MILK OF MAGNESIA) 400 MG/5ML suspension Take 15-30 mLs by mouth at bedtime as needed for mild constipation. 360 mL 0  . montelukast (SINGULAIR) 10 MG tablet TAKE 1 TABLET(10 MG) BY MOUTH AT BEDTIME 90 tablet 1  . Multiple Vitamin (MULTIVITAMIN WITH MINERALS) TABS tablet Take 1 tablet by mouth  every other day.    . ondansetron (ZOFRAN ODT) 4 MG disintegrating tablet Take 1 tablet (4 mg total) by mouth every 8 (eight) hours as needed for nausea or vomiting. 15 tablet 0  . rivaroxaban (XARELTO) 20 MG TABS tablet Take 1 tablet (20 mg total) by mouth daily with supper. 30 tablet 6  . sucralfate (CARAFATE) 1 g tablet Take 1 tablet (1 g total) by mouth 4 (four) times daily. 120 tablet 1  . tiotropium (SPIRIVA) 18 MCG inhalation capsule Place 1  capsule (18 mcg total) into inhaler and inhale daily. 90 capsule 1  . triamcinolone cream (KENALOG) 0.1 % Apply 1 application topically 2 (two) times daily. (Patient taking differently: Apply 1 application topically every other day. ) 30 g 0  . potassium chloride SA (K-DUR,KLOR-CON) 20 MEQ tablet Take 1 tablet (20 mEq total) by mouth 2 (two) times daily for 3 days. 6 tablet 0   No current facility-administered medications for this encounter.     Allergies  Allergen Reactions  . Albuterol Palpitations    Caused afib   . Percocet [Oxycodone-Acetaminophen] Shortness Of Breath, Itching and Anxiety    Makes jittery, difficulty breathing, itching  . Celecoxib Rash  . Protonix [Pantoprazole Sodium] Palpitations    Social History   Socioeconomic History  . Marital status: Single    Spouse name: Not on file  . Number of children: Not on file  . Years of education: Not on file  . Highest education level: Not on file  Occupational History  . Occupation: Retired  Scientific laboratory technician  . Financial resource strain: Not on file  . Food insecurity:    Worry: Not on file    Inability: Not on file  . Transportation needs:    Medical: Not on file    Non-medical: Not on file  Tobacco Use  . Smoking status: Former Smoker    Last attempt to quit: 10/24/2015    Years since quitting: 3.2  . Smokeless tobacco: Never Used  Substance and Sexual Activity  . Alcohol use: No  . Drug use: Not Currently    Types: Marijuana    Comment: Smoked marijuana in the past.  . Sexual activity: Yes    Partners: Male    Birth control/protection: Post-menopausal  Lifestyle  . Physical activity:    Days per week: Not on file    Minutes per session: Not on file  . Stress: Not on file  Relationships  . Social connections:    Talks on phone: Not on file    Gets together: Not on file    Attends religious service: Not on file    Active member of club or organization: Not on file    Attends meetings of clubs or  organizations: Not on file    Relationship status: Not on file  . Intimate partner violence:    Fear of current or ex partner: Not on file    Emotionally abused: Not on file    Physically abused: Not on file    Forced sexual activity: Not on file  Other Topics Concern  . Not on file  Social History Narrative   Lives alone in [place], [place] and has 2 daughters who are within driving distance   Enjoys watching soap operas, reading, exercise    Family History  Problem Relation Age of Onset  . Heart attack Mother        [name] age  . Breast cancer Sister  Late 17s; now s/p mastectomy   . Lung cancer Brother        x 2  . Cancer Sister   . Cancer Sister   . Cancer Sister   . Colon cancer Neg Hx   . Colon polyps Neg Hx   . Esophageal cancer Neg Hx   . Rectal cancer Neg Hx   . Stomach cancer Neg Hx     ROS- All systems are reviewed and negative except as per the HPI above  Physical Exam: Vitals:   01/29/19 1038  BP: (!) 142/72  Pulse: 76  Weight: 67 kg  Height: 5\' 7"  (1.702 m)   Wt Readings from Last 3 Encounters:  01/29/19 67 kg  12/08/18 67.1 kg  12/07/18 67.1 kg    Labs: Lab Results  Component Value Date   NA 139 12/09/2018   K 3.9 12/09/2018   CL 106 12/09/2018   CO2 26 12/09/2018   GLUCOSE 175 (H) 12/09/2018   BUN 6 (L) 12/09/2018   CREATININE 0.80 12/09/2018   CALCIUM 9.6 12/09/2018   PHOS 3.0 04/15/2017   MG 2.1 08/29/2018   Lab Results  Component Value Date   INR 1.0 12/08/2018   Lab Results  Component Value Date   CHOL 245 (H) 03/17/2016   HDL 94 03/17/2016   LDLCALC 132 (H) 03/17/2016   TRIG 95 03/17/2016     GEN- The patient is well appearing, alert and oriented x 3 today.   Head- normocephalic, atraumatic Eyes-  Sclera clear, conjunctiva pink Ears- hearing intact Oropharynx- clear Neck- supple, no JVP Lymph- no cervical lymphadenopathy Lungs- Clear to ausculation bilaterally, normal work of breathing Heart- Regular rate and  rhythm, no murmurs, rubs or gallops, PMI not laterally displaced GI- soft, NT, ND, + BS Extremities- no clubbing, cyanosis, or edema MS- no significant deformity or atrophy Skin- no rash or lesion Psych- euthymic mood, full affect Neuro- strength and sensation are intact  EKG-SR with pac's, pr int 122 ms, qrs int 82 ms, qtc 445 ms    Assessment and Plan: 1. Paroxysmal afib S/p ablation 2017 No afib issues currently Continue flecainide without cahnge Continue  diltiazem  120 mg daily Continue xarelto 20 mg daily for a CHA2DS2VASc score of 3  F/u here in 6 months F/u with PCP as scheduled  Butch Penny C. Okechukwu Regnier, [facility] [address] [place], [place] [address] [phone]

## 2019-01-31 ENCOUNTER — Telehealth: Payer: Self-pay | Admitting: Neurology

## 2019-01-31 NOTE — Telephone Encounter (Signed)
Due to current COVID 19 pandemic, our office is severely reducing in office visits until further notice, in order to minimize the risk to our patients and healthcare providers.   Called patient regarding her 6/15 appointment to offer virtual visit or in office. I LVM and gave my direct number and office hours for the patient to call back.

## 2019-02-01 DIAGNOSIS — J4520 Mild intermittent asthma, uncomplicated: Secondary | ICD-10-CM | POA: Diagnosis not present

## 2019-02-01 DIAGNOSIS — J45901 Unspecified asthma with (acute) exacerbation: Secondary | ICD-10-CM | POA: Diagnosis not present

## 2019-02-03 ENCOUNTER — Other Ambulatory Visit: Payer: Self-pay | Admitting: Internal Medicine

## 2019-02-05 ENCOUNTER — Ambulatory Visit: Payer: Medicare Other | Admitting: Neurology

## 2019-02-05 NOTE — Telephone Encounter (Signed)
Pt needs refill on linaclotide (LINZESS) 145 MCG CAPS capsule  [facility] [place], [address] ; pt contact [phone]

## 2019-02-06 NOTE — Telephone Encounter (Signed)
Patient returned my call today and stated that she would like to cancel her appointment as she is no longer experiencing the headaches that she wanted to be seen for. I cancelled her appt and advised her to call back if needed.

## 2019-02-06 NOTE — Telephone Encounter (Signed)
Noted  

## 2019-02-17 IMAGING — CR DG CHEST 2V
2 series · 2 of 2 positions shown · non-contrast
Comparison: 10/12/2017

CLINICAL DATA: Midline to right-sided chest pain under right
breast. Recent flu. CP

EXAM:
CHEST - 2 VIEW

[chest pa]
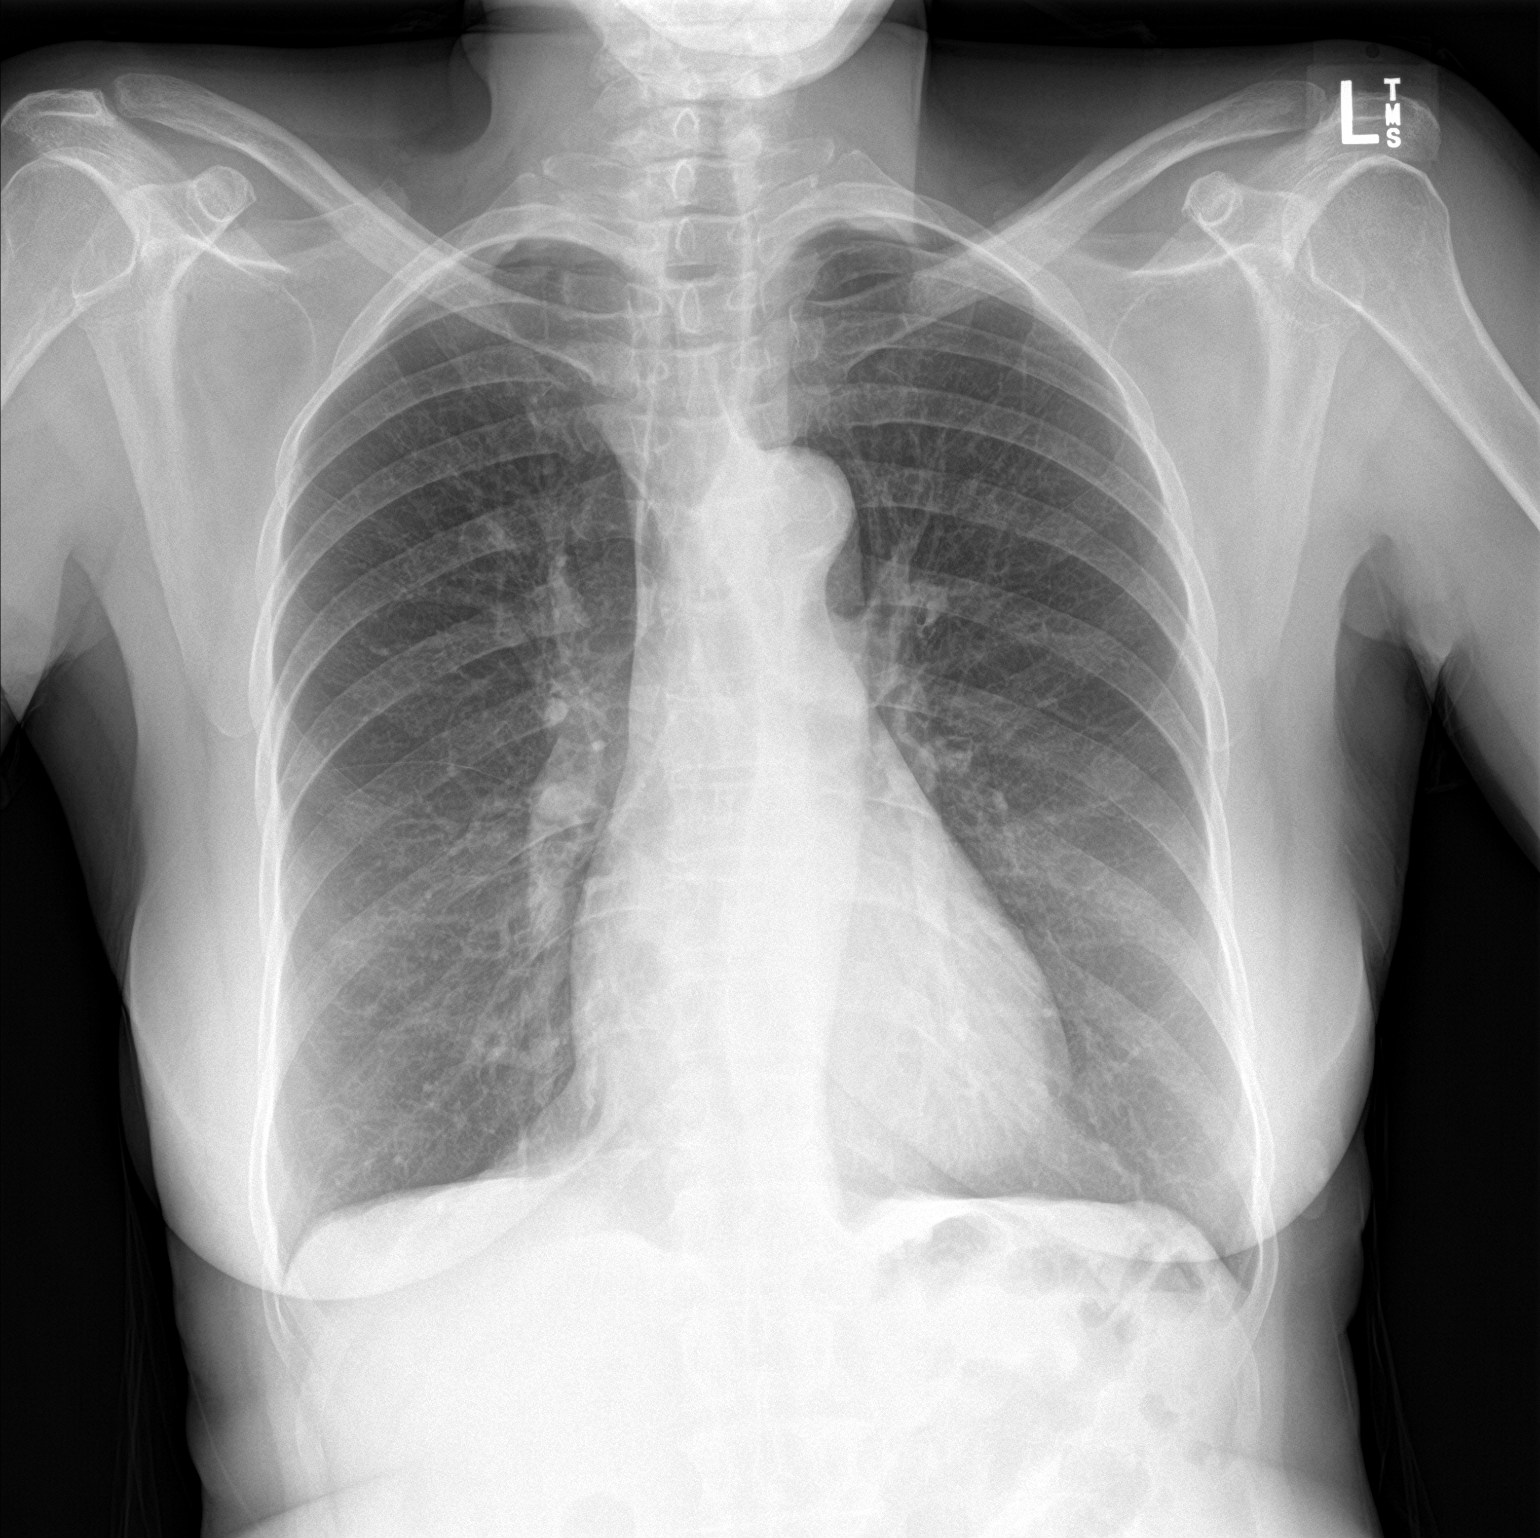

[chest lat]
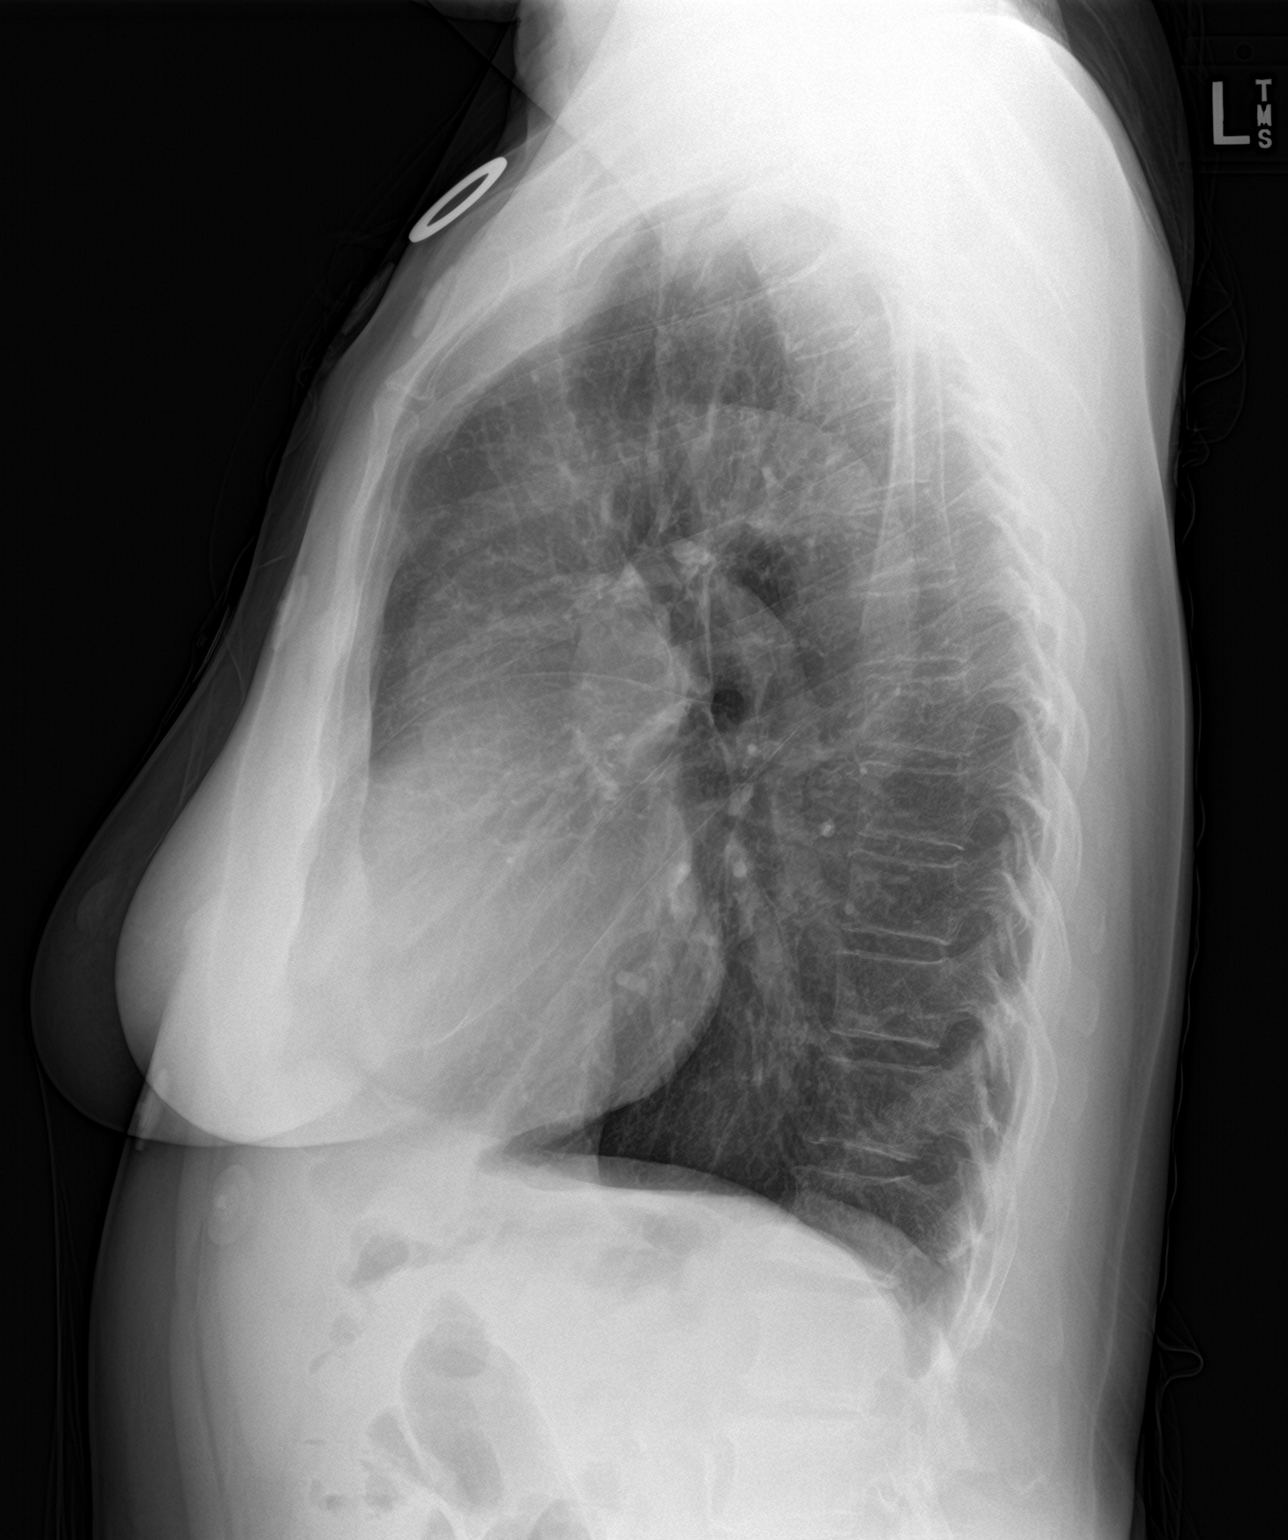

[2 of 2 positions shown; findings below may reference images not displayed]

FINDINGS: The heart size and mediastinal contours are within normal limits.
Aortic atherosclerosis noted. Both lungs are clear. The visualized
skeletal structures are unremarkable.
IMPRESSION: No active cardiopulmonary disease.

Aortic Atherosclerosis ([id number]-JX9.9).

## 2019-02-19 ENCOUNTER — Encounter: Payer: Self-pay | Admitting: *Deleted

## 2019-02-26 ENCOUNTER — Encounter: Payer: Medicare Other | Admitting: Internal Medicine

## 2019-03-02 ENCOUNTER — Other Ambulatory Visit: Payer: Self-pay

## 2019-03-02 ENCOUNTER — Emergency Department (HOSPITAL_COMMUNITY)
Admission: EM | Admit: 2019-03-02 | Discharge: 2019-03-02 | Disposition: A | Discharge status: Home / Self Care | Payer: Medicare Other | Attending: Emergency Medicine | Admitting: Emergency Medicine

## 2019-03-02 DIAGNOSIS — L500 Allergic urticaria: Secondary | ICD-10-CM | POA: Diagnosis not present

## 2019-03-02 DIAGNOSIS — I10 Essential (primary) hypertension: Secondary | ICD-10-CM | POA: Insufficient documentation

## 2019-03-02 DIAGNOSIS — Z7901 Long term (current) use of anticoagulants: Secondary | ICD-10-CM | POA: Insufficient documentation

## 2019-03-02 DIAGNOSIS — I480 Paroxysmal atrial fibrillation: Secondary | ICD-10-CM | POA: Insufficient documentation

## 2019-03-02 DIAGNOSIS — Z87891 Personal history of nicotine dependence: Secondary | ICD-10-CM | POA: Insufficient documentation

## 2019-03-02 DIAGNOSIS — Z79899 Other long term (current) drug therapy: Secondary | ICD-10-CM | POA: Insufficient documentation

## 2019-03-02 DIAGNOSIS — J45909 Unspecified asthma, uncomplicated: Secondary | ICD-10-CM | POA: Diagnosis not present

## 2019-03-02 DIAGNOSIS — L509 Urticaria, unspecified: Secondary | ICD-10-CM | POA: Diagnosis not present

## 2019-03-02 DIAGNOSIS — R21 Rash and other nonspecific skin eruption: Secondary | ICD-10-CM | POA: Diagnosis present

## 2019-03-02 MED ORDER — HYDROXYZINE HCL 25 MG PO TABS
25.0000 mg | ORAL_TABLET | Freq: Once | ORAL | Status: AC
  Administered 2019-03-02: 25 mg via ORAL
  Filled 2019-03-02: qty 1

## 2019-03-02 MED ORDER — HYDROXYZINE HCL 25 MG PO TABS: 25.0000 mg | ORAL_TABLET | Freq: Four times a day (QID) | ORAL | 0 refills | Status: DC

## 2019-03-02 NOTE — ED Notes (Signed)
ED Provider at bedside. 

## 2019-03-02 NOTE — Discharge Instructions (Addendum)
You were seen in the ER for hives.  This may be from the recent diet changes or any topical lotions or creams that you may have been exposed to.  Take a daily over-the-counter allergy medicine such as Claritin, Claritin or Zyrtec.  Additionally I have given you prescription for hydroxyzine which will help with itching.  You can apply cortisone cream to the area and ice to prevent itching.  Return to ER for worsening rash all over your body, facial lip tongue throat swelling, chest pain or shortness of breath

## 2019-03-02 NOTE — ED Triage Notes (Signed)
Patient reports rash on body that began after she started drinking hawaiin punch

## 2019-03-02 NOTE — ED Provider Notes (Signed)
[facility] EMERGENCY DEPARTMENT Provider Note   CSN: [id number] Arrival date & time: 03/02/19  7169     History   Chief Complaint Chief Complaint  Patient presents with  . Rash    HPI [name] is a 68 y.o. female history of eczema, asthma presents to the ER for evaluation of hives.  Onset 3 days ago.  Described as swollen.  Localized to the left lateral thigh.  States her doctors recently told her to stop drinking soda so she has been drinking a lot of water.  She got tired of drinking water so she got Hawaiian punch and started drinking it 3 days ago.  She thinks that she developed this rash from drinking Webb punch.  Started taking Linzess for constipation 1 month ago but no other new medicines.  No interventions for the hives.  Feels like when her skin itches and she scratches to then more hives will show up afterwards.  Denies facial tongue lip throat swelling, chest pain, shortness of breath.  No known allergies to foods.  No alleviating factors.  No modifying factors. She wants a medicine for itching and maybe an allergy shot.      HPI  Past Medical History:  Diagnosis Date  . A-fib ([place])   . Allergy    seasonal  . Asthma   . Chronic anticoagulation   . Eczema   . GERD (gastroesophageal reflux disease)   . History of kidney stones   . Hypertension   . PAF (paroxysmal atrial fibrillation) ([place]) 11/26/2015   April/May 2017: Multiple ED visits for A. Fib.  02/12/16: Improved on diltiazem IV. Cardiology consulted in the ED and discharge patient with diltiazem XR 120 mg once daily with 60 mg by mouth every 6 hours as needed for symptomatic palpitations, flecainide 50 mg twice daily. Referred to the atrial fibrillation clinic.  02/17/16: Seen in the atrial fibrillation clinic by NP Roderic Palau. Normal sin    Patient Active Problem List   Diagnosis Date Noted  . Gallstones 12/08/2018  . Choledocholithiasis   . Vaginal discharge 10/11/2018  . Vaginal  bleeding 10/11/2018  . History of radiofrequency ablation (RFA) procedure for cardiac arrhythmia   . Asthma exacerbation 08/28/2018  . Chronic congestion of paranasal sinus 06/14/2018  . Primary osteoarthritis involving multiple joints 06/14/2018  . Acute pain of left knee 05/15/2018  . Hyperpigmented skin lesion 09/09/2017  . Osteoporosis without current pathological fracture 04/15/2017  . Normocytic anemia 10/19/2016  . Hyperlipidemia 03/18/2016  . Special screening for malignant neoplasms, colon 03/17/2016  . Cervical cancer screening 03/17/2016  . Assistance with transportation 03/17/2016  . HTN (hypertension), benign   . GERD (gastroesophageal reflux disease)   . PAF (paroxysmal atrial fibrillation) ([place]) 11/26/2015  . Chronic anticoagulation 11/26/2015  . Marijuana abuse 06/11/2015    Past Surgical History:  Procedure Laterality Date  . ATRIAL FIBRILLATION ABLATION N/A 09/30/2017   Procedure: ATRIAL FIBRILLATION ABLATION;  Surgeon: Thompson Grayer, MD;  Location: [facility];  Service: Cardiovascular;  Laterality: N/A;  . CHOLECYSTECTOMY N/A 12/08/2018   Procedure: LAPAROSCOPIC CHOLECYSTECTOMY WITH INTRAOPERATIVE CHOLANGIOGRAM;  Surgeon: Jovita Kussmaul, MD;  Location: [facility];  Service: General;  Laterality: N/A;  . ERCP N/A 12/09/2018   Procedure: ENDOSCOPIC RETROGRADE CHOLANGIOPANCREATOGRAPHY (ERCP);  Surgeon: Carol Ada, MD;  Location: [facility];  Service: Endoscopy;  Laterality: N/A;  . SPHINCTEROTOMY  12/09/2018   Procedure: SPHINCTEROTOMY;  Surgeon: Carol Ada, MD;  Location: [facility];  Service: Endoscopy;;  .  TONSILLECTOMY    . TUBAL LIGATION       OB History   No obstetric history on file.      Home Medications    Prior to Admission medications   Medication Sig Start Date End Date Taking? Authorizing Provider  diltiazem (CARDIZEM) 60 MG tablet Take 60 mg by mouth every 6 (six) hours as needed (Rapid AFIB over 100).    [provider]   diltiazem (CARTIA XT) 120 MG 24 hr capsule Take 1 capsule (120 mg total) by mouth 2 (two) times daily. 10/23/18   Sherran Needs, NP  fluticasone (FLOVENT HFA) 44 MCG/ACT inhaler Inhale 1 puff into the lungs daily.     [provider]  HYDROcodone-acetaminophen (NORCO/VICODIN) 5-325 MG tablet Take 1 tablet by mouth every 6 (six) hours as needed. 12/10/18   Johnathan Hausen, MD  hydrOXYzine (ATARAX/VISTARIL) 25 MG tablet Take 1 tablet (25 mg total) by mouth every 6 (six) hours. 03/02/19   Kinnie Feil, PA-C  levalbuterol (XOPENEX HFA) 45 MCG/ACT inhaler INHALE 1 PUFF INTO LUNGS EVERY 6 HOURS AS NEEDED FOR SHORTNESS OF BREATH Patient taking differently: Inhale 1 puff into the lungs every 6 (six) hours as needed for shortness of breath.  11/29/18   [name], MD  LINZESS 145 MCG CAPS capsule TAKE 1 CAPSULE(145 MCG) BY MOUTH DAILY BEFORE BREAKFAST 02/05/19   [name], MD  losartan (COZAAR) 50 MG tablet TAKE 1 TABLET(50 MG) BY MOUTH DAILY 01/01/19   [name], MD  magnesium hydroxide (MILK OF MAGNESIA) 400 MG/5ML suspension Take 15-30 mLs by mouth at bedtime as needed for mild constipation. 11/01/17   Harris, Abigail, PA-C  montelukast (SINGULAIR) 10 MG tablet TAKE 1 TABLET(10 MG) BY MOUTH AT BEDTIME 01/12/19   [name], MD  Multiple Vitamin (MULTIVITAMIN WITH MINERALS) TABS tablet Take 1 tablet by mouth every other day.    [provider]  ondansetron (ZOFRAN ODT) 4 MG disintegrating tablet Take 1 tablet (4 mg total) by mouth every 8 (eight) hours as needed for nausea or vomiting. 11/21/18   Duffy Bruce, MD  potassium chloride SA (K-DUR,KLOR-CON) 20 MEQ tablet Take 1 tablet (20 mEq total) by mouth 2 (two) times daily for 3 days. 11/21/18 12/04/18  Duffy Bruce, MD  rivaroxaban (XARELTO) 20 MG TABS tablet Take 1 tablet (20 mg total) by mouth daily with supper. 01/01/19   Sherran Needs, NP  sucralfate (CARAFATE) 1 g tablet Take 1 tablet (1 g total) by mouth 4 (four)  times daily. 10/16/18 10/16/19  Oval Linsey, MD  tiotropium (SPIRIVA) 18 MCG inhalation capsule Place 1 capsule (18 mcg total) into inhaler and inhale daily. 01/01/19   [name], MD  triamcinolone cream (KENALOG) 0.1 % Apply 1 application topically 2 (two) times daily. Patient taking differently: Apply 1 application topically every other day.  05/06/18   Henderly, Britni A, PA-C    Family History Family History  Problem Relation Age of Onset  . Heart attack Mother        [name] age  . Breast cancer Sister        Late 80s; now s/p mastectomy   . Lung cancer Brother        x 2  . Cancer Sister   . Cancer Sister   . Cancer Sister   . Colon cancer Neg Hx   . Colon polyps Neg Hx   . Esophageal cancer Neg Hx   . Rectal cancer Neg Hx   . Stomach  cancer Neg Hx     Social History Social History   Tobacco Use  . Smoking status: Former Smoker    Quit date: 10/24/2015    Years since quitting: 3.3  . Smokeless tobacco: Never Used  Substance Use Topics  . Alcohol use: No  . Drug use: Not Currently    Types: Marijuana    Comment: Smoked marijuana in the past.     Allergies   Albuterol, Percocet [oxycodone-acetaminophen], Celecoxib, and Protonix [pantoprazole sodium]   Review of Systems Review of Systems  Skin: Positive for rash.  All other systems reviewed and are negative.    Physical Exam Updated Vital Signs BP (!) 161/92 (BP Location: Right Arm)   Pulse 82   Temp 98.7 F (37.1 C) (Oral)   Resp 18   SpO2 97%   Physical Exam Vitals signs and nursing note reviewed.  Constitutional:      General: She is not in acute distress.    Appearance: She is well-developed.     Comments: NAD.  HENT:     Head: Normocephalic and atraumatic.     Right Ear: External ear normal.     Left Ear: External ear normal.     Nose: Nose normal.     Mouth/Throat:     Comments: No lip, tongue, oropharyngeal edema. Eyes:     General: No scleral icterus.    Conjunctiva/sclera:  Conjunctivae normal.  Neck:     Musculoskeletal: Normal range of motion and neck supple.  Cardiovascular:     Rate and Rhythm: Normal rate and regular rhythm.     Heart sounds: Normal heart sounds. No murmur.  Pulmonary:     Effort: Pulmonary effort is normal.     Breath sounds: Normal breath sounds. No wheezing.  Musculoskeletal: Normal range of motion.        General: No deformity.  Skin:    General: Skin is warm and dry.     Capillary Refill: Capillary refill takes less than 2 seconds.     Findings: Rash present.     Comments: 3-4 urticarial mildly erythematous lesions to the left lateral thigh, nontender.  No other lesions to the chest, back, abdomen, upper or right lower extremity.  No lesions to the palms or soles.  Neurological:     Mental Status: She is alert and oriented to person, place, and time.  Psychiatric:        Behavior: Behavior normal.        Thought Content: Thought content normal.        Judgment: Judgment normal.      ED Treatments / Results  Labs (all labs ordered are listed, but only abnormal results are displayed) Labs Reviewed - No data to display  EKG None  Radiology No results found.  Procedures Procedures (including critical care time)  Medications Ordered in ED Medications  hydrOXYzine (ATARAX/VISTARIL) tablet 25 mg (25 mg Oral Given 03/02/19 0853)     Initial Impression / Assessment and Plan / ED Course  I have reviewed the triage vital signs and the nursing notes.  Pertinent labs & imaging results that were available during my care of the patient were reviewed by me and considered in my medical decision making (see chart for details).  68 y.o. yo female with raised, erythematous, pruritic lesions.  No facial or oral angioedema.  No respiratory compromise. Recently started drinking hawaiin punch for the first time before lesions began.  Also started linzess 1-2 months ago.  No new  topical hygiene products used.  No evidence of animal  bite wounds.  No fluctuance, induration that would suggest abscess or cellulitis. Very low suspicion for dermatological emergency including EM, SJS, meningococcemia.  No rash to palms/soles. Patient otherwise feels well and at baseline, no URI or viral type systemic symptoms that would suggest viral exanthem.  Suspect urticaria secondary to some environmental allergen. Patient will be discharged with hydroxyzine, cortisone ice and close f/u with PCP.  Steroids or IV medicines not indicated at this time.  Strict ED return precautions given, mother is aware of red flag symptoms that would warrant return to ED for further evaluation and tx.     Final Clinical Impressions(s) / ED Diagnoses   Final diagnoses:  Urticaria    ED Discharge Orders         Ordered    hydrOXYzine (ATARAX/VISTARIL) 25 MG tablet  Every 6 hours     03/02/19 0843           Kinnie Feil, PA-C 03/02/19 0900    Isla Pence, MD 03/02/19 [phone]

## 2019-03-03 DIAGNOSIS — J4520 Mild intermittent asthma, uncomplicated: Secondary | ICD-10-CM | POA: Diagnosis not present

## 2019-03-03 DIAGNOSIS — J45901 Unspecified asthma with (acute) exacerbation: Secondary | ICD-10-CM | POA: Diagnosis not present

## 2019-03-09 IMAGING — DX DG CHEST 2V
2 series · 2 of 2 positions shown · non-contrast
Comparison: 11/01/2017

CLINICAL DATA: Chest pain

EXAM:
CHEST - 2 VIEW

[chest lat]
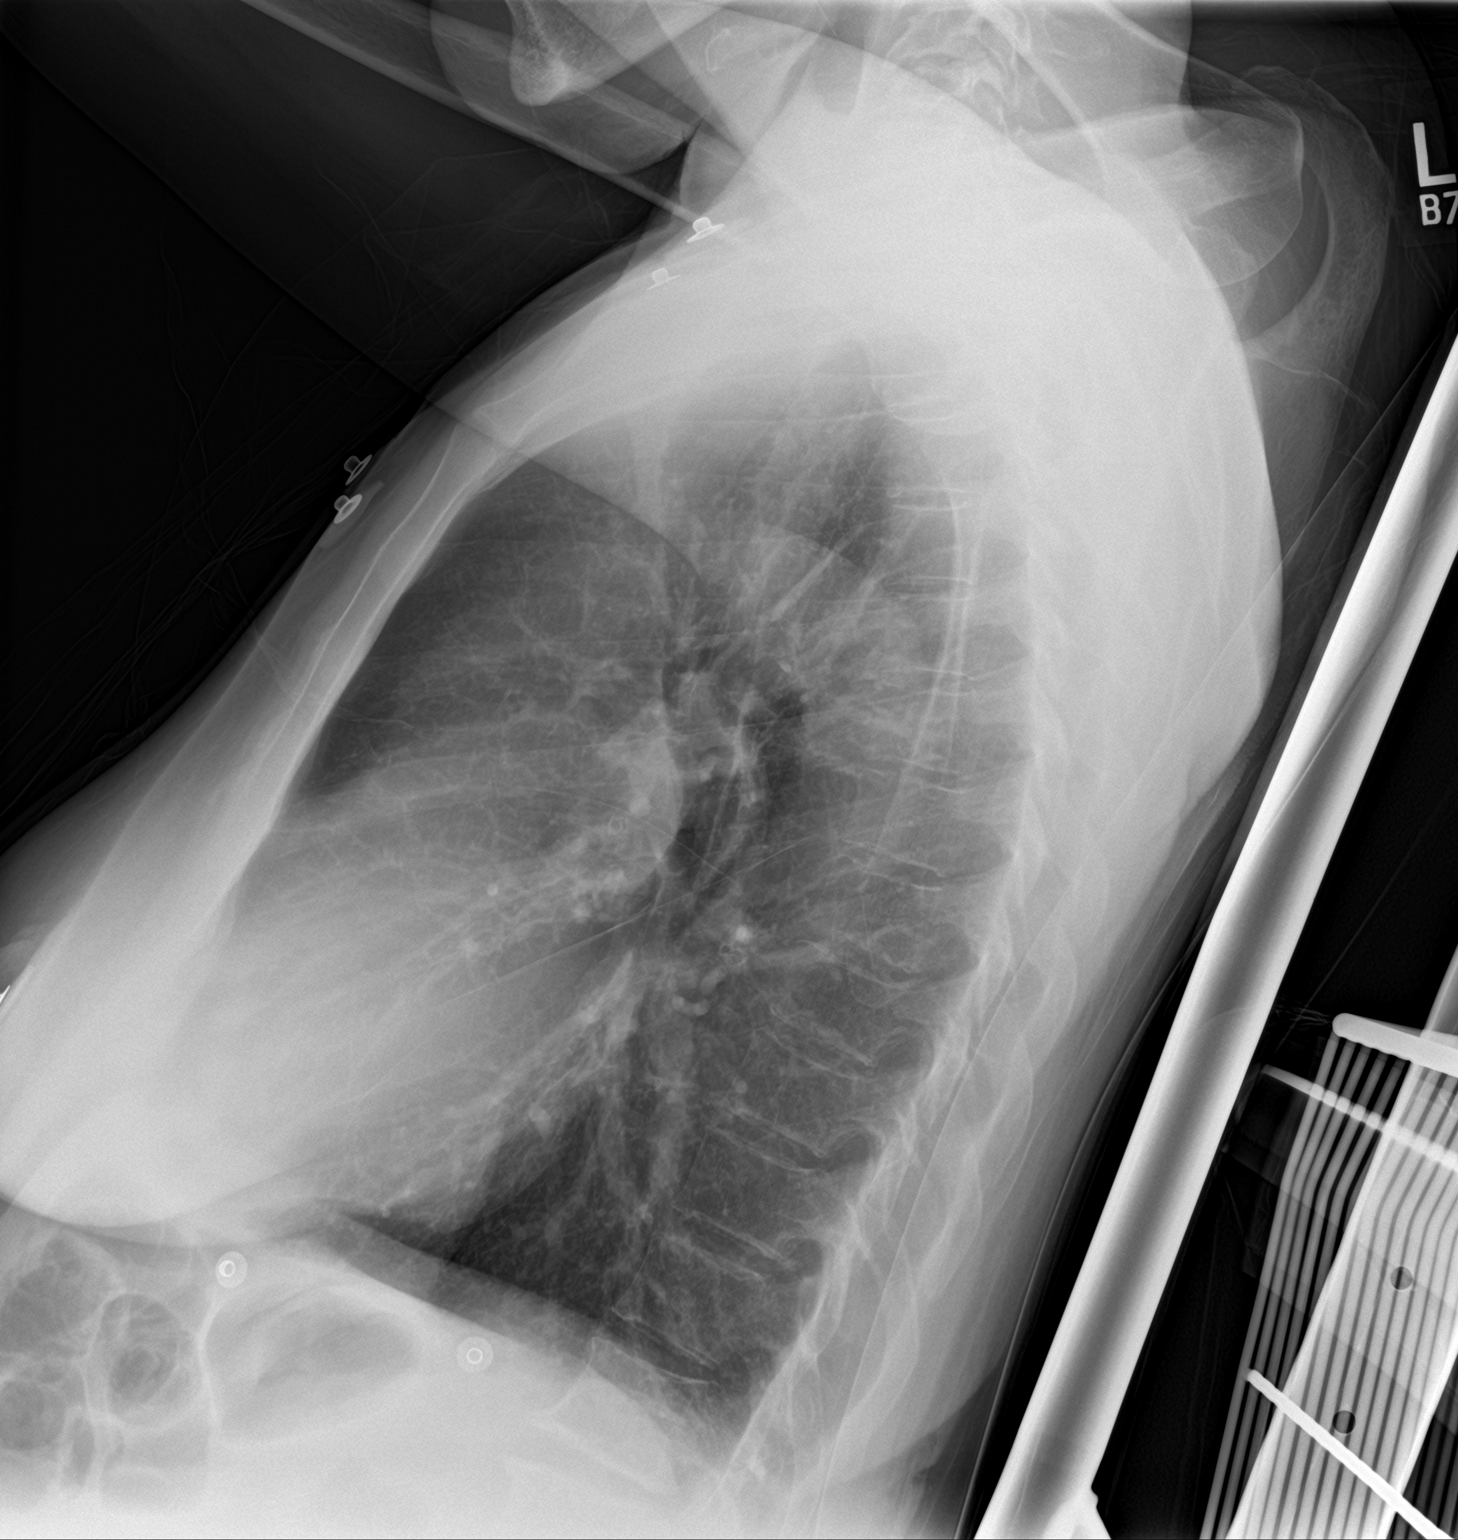

[chest ap]
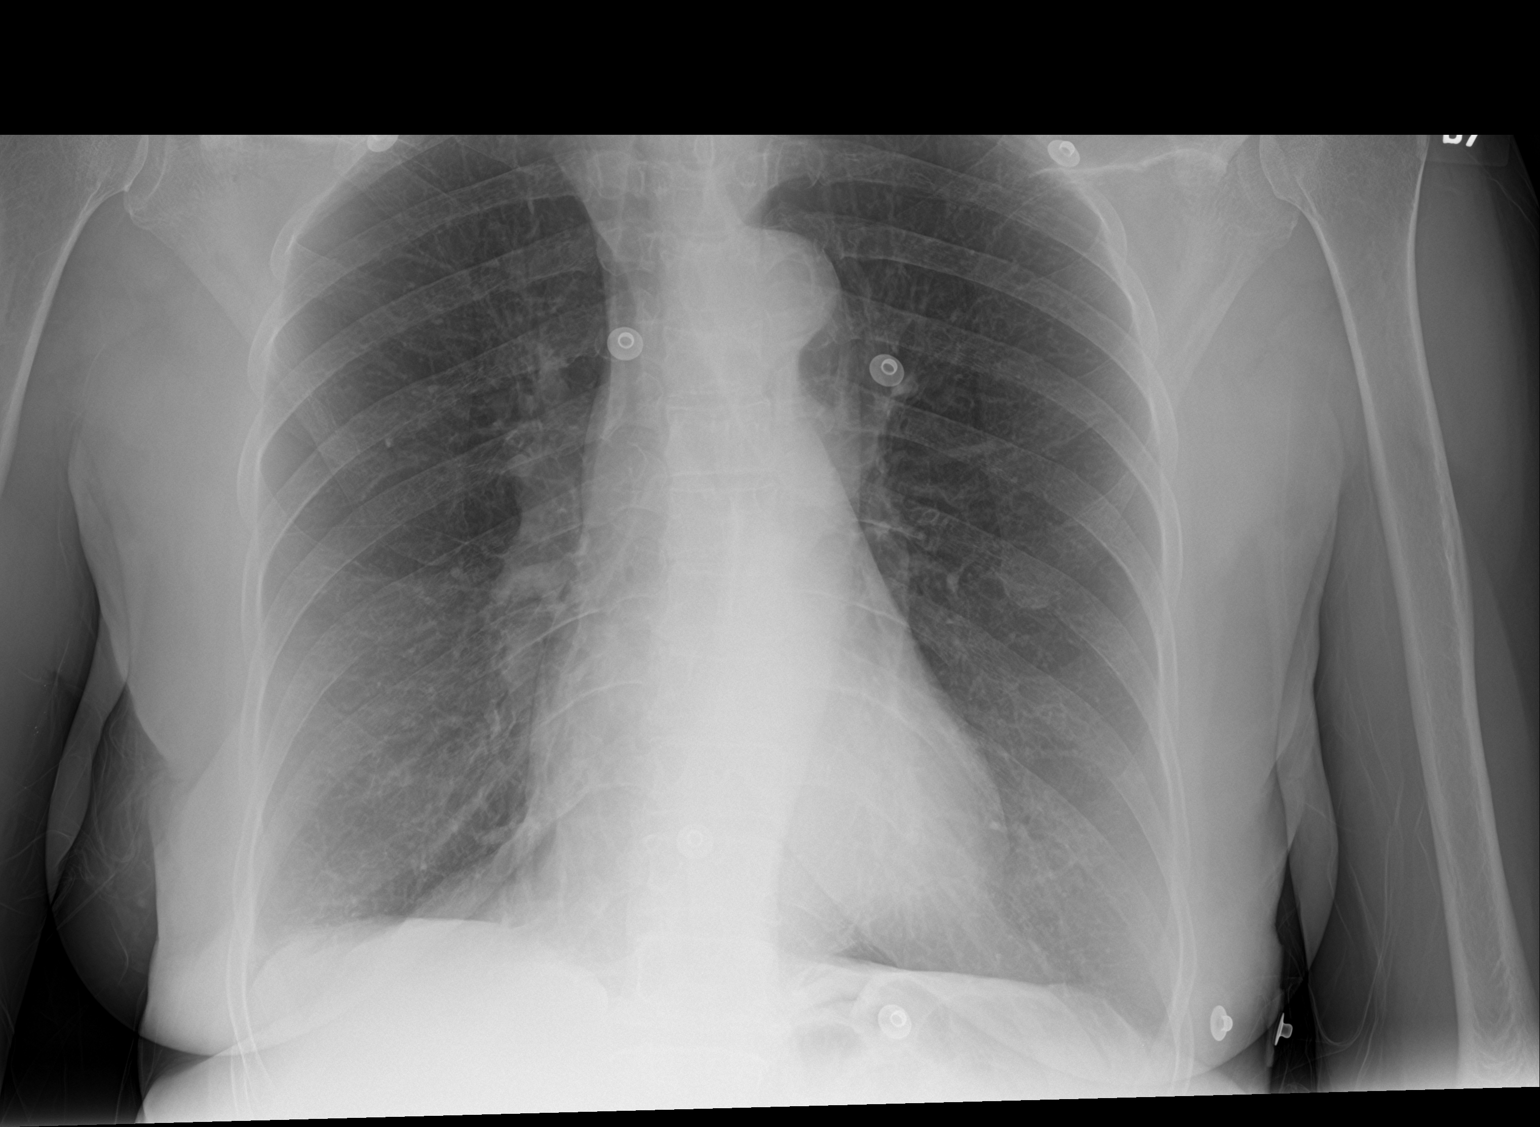

[2 of 2 positions shown; findings below may reference images not displayed]

FINDINGS: Normal heart size. Mild aortic tortuosity. Chronic hyperinflation
with diaphragm flattening. Smooth opacity at the medial right base
attributed to fat pad based on recent chest CT.
IMPRESSION: Chronic hyperinflation suggesting COPD. No evidence of active
disease.

## 2019-03-15 ENCOUNTER — Ambulatory Visit: Payer: Medicare Other | Admitting: Neurology

## 2019-03-30 ENCOUNTER — Other Ambulatory Visit: Payer: Self-pay

## 2019-03-30 ENCOUNTER — Other Ambulatory Visit: Payer: Self-pay | Admitting: Internal Medicine

## 2019-03-30 NOTE — Telephone Encounter (Signed)
potassium chloride SA (K-DUR,KLOR-CON) 20 MEQ tablet(Expired, REFILL REQUEST @ [place], [place] - [place] AT [address]

## 2019-04-03 DIAGNOSIS — J4520 Mild intermittent asthma, uncomplicated: Secondary | ICD-10-CM | POA: Diagnosis not present

## 2019-04-03 DIAGNOSIS — J45901 Unspecified asthma with (acute) exacerbation: Secondary | ICD-10-CM | POA: Diagnosis not present

## 2019-04-04 ENCOUNTER — Other Ambulatory Visit: Payer: Self-pay

## 2019-04-04 ENCOUNTER — Ambulatory Visit (INDEPENDENT_AMBULATORY_CARE_PROVIDER_SITE_OTHER): Payer: Medicare Other | Admitting: Internal Medicine

## 2019-04-04 VITALS — BP 146/81 | HR 64 | Temp 98.1°F | Wt 154.9 lb

## 2019-04-04 DIAGNOSIS — E876 Hypokalemia: Secondary | ICD-10-CM | POA: Diagnosis not present

## 2019-04-04 DIAGNOSIS — Z79899 Other long term (current) drug therapy: Secondary | ICD-10-CM

## 2019-04-04 DIAGNOSIS — L819 Disorder of pigmentation, unspecified: Secondary | ICD-10-CM

## 2019-04-04 DIAGNOSIS — K5900 Constipation, unspecified: Secondary | ICD-10-CM

## 2019-04-04 DIAGNOSIS — L299 Pruritus, unspecified: Secondary | ICD-10-CM

## 2019-04-04 DIAGNOSIS — I10 Essential (primary) hypertension: Secondary | ICD-10-CM

## 2019-04-04 LAB — BASIC METABOLIC PANEL
Anion gap: 8 (ref 5–15)
BUN: 6 mg/dL — ABNORMAL LOW (ref 8–23)
CO2: 25 mmol/L (ref 22–32)
Calcium: 9.4 mg/dL (ref 8.9–10.3)
Chloride: 107 mmol/L (ref 98–111)
Creatinine, Ser: 0.8 mg/dL (ref 0.44–1.00)
GFR calc Af Amer: >60 mL/min (ref >60)
GFR calc non Af Amer: >60 mL/min (ref >60)
Glucose, Bld: 90 mg/dL (ref 70–99)
Potassium: 3.7 mmol/L (ref 3.5–5.1)
Sodium: 140 mmol/L (ref 135–145)

## 2019-04-04 MED ORDER — POTASSIUM CHLORIDE CRYS ER 20 MEQ PO TBCR: 20.0000 meq | EXTENDED_RELEASE_TABLET | Freq: Two times a day (BID) | ORAL | 1 refills | Status: DC

## 2019-04-04 MED ORDER — SORBITOL 70 % PO SOLN: 15.0000 mL | Freq: Every day | ORAL | 0 refills | Status: DC | PRN

## 2019-04-04 MED ORDER — LEVALBUTEROL TARTRATE 45 MCG/ACT IN AERO: INHALATION_SPRAY | RESPIRATORY_TRACT | 1 refills | Status: DC

## 2019-04-04 MED ORDER — POTASSIUM CHLORIDE CRYS ER 10 MEQ PO TBCR: 20.0000 meq | EXTENDED_RELEASE_TABLET | Freq: Two times a day (BID) | ORAL | 1 refills | Status: DC

## 2019-04-04 MED ORDER — DIPHENHYDRAMINE HCL 2 % EX CREA: TOPICAL_CREAM | Freq: Three times a day (TID) | CUTANEOUS | 0 refills | Status: DC | PRN

## 2019-04-04 MED ORDER — TRIAMCINOLONE ACETONIDE 0.1 % EX CREA: 1.0000 "application " | TOPICAL_CREAM | Freq: Two times a day (BID) | CUTANEOUS | 0 refills | Status: DC

## 2019-04-04 MED ORDER — POTASSIUM CHLORIDE CRYS ER 20 MEQ PO TBCR: 20.0000 meq | EXTENDED_RELEASE_TABLET | Freq: Two times a day (BID) | ORAL | 0 refills | Status: DC

## 2019-04-04 NOTE — Patient Instructions (Addendum)
[name], It was nice seeing you! Today we discussed:  Your rash: it should continue to get better with steroid cream. I have also sent in an anti-itch medication, as avoiding scratching will be most important for healing.   Your constipation: I'd like you to try sorbitol. You will put a serving into your drink of choice each morning (it is very sweet) and see if this helps you have more frequent bowel movements.   Your potassium: we are checking your labs today and sent in the refill for your supplement.   You can follow-up in 2-3 months to meet your new PCP.   Take care, Dr. Koleen Distance

## 2019-04-07 ENCOUNTER — Emergency Department (HOSPITAL_COMMUNITY): Payer: Medicare Other

## 2019-04-07 ENCOUNTER — Emergency Department (HOSPITAL_COMMUNITY)
Admission: EM | Admit: 2019-04-07 | Discharge: 2019-04-07 | Disposition: A | Discharge status: Home / Self Care | Payer: Medicare Other | Attending: Emergency Medicine | Admitting: Emergency Medicine

## 2019-04-07 ENCOUNTER — Encounter: Payer: Self-pay | Admitting: Internal Medicine

## 2019-04-07 ENCOUNTER — Other Ambulatory Visit: Payer: Self-pay

## 2019-04-07 ENCOUNTER — Encounter (HOSPITAL_COMMUNITY): Payer: Self-pay | Admitting: *Deleted

## 2019-04-07 DIAGNOSIS — L309 Dermatitis, unspecified: Secondary | ICD-10-CM | POA: Diagnosis not present

## 2019-04-07 DIAGNOSIS — I10 Essential (primary) hypertension: Secondary | ICD-10-CM | POA: Diagnosis not present

## 2019-04-07 DIAGNOSIS — Z87891 Personal history of nicotine dependence: Secondary | ICD-10-CM | POA: Insufficient documentation

## 2019-04-07 DIAGNOSIS — Z79899 Other long term (current) drug therapy: Secondary | ICD-10-CM | POA: Diagnosis not present

## 2019-04-07 DIAGNOSIS — R0602 Shortness of breath: Secondary | ICD-10-CM | POA: Diagnosis not present

## 2019-04-07 DIAGNOSIS — J4541 Moderate persistent asthma with (acute) exacerbation: Secondary | ICD-10-CM | POA: Diagnosis not present

## 2019-04-07 DIAGNOSIS — J45901 Unspecified asthma with (acute) exacerbation: Secondary | ICD-10-CM | POA: Insufficient documentation

## 2019-04-07 LAB — BASIC METABOLIC PANEL
Anion gap: 10 (ref 5–15)
BUN: 10 mg/dL (ref 8–23)
CO2: 24 mmol/L (ref 22–32)
Calcium: 10.1 mg/dL (ref 8.9–10.3)
Chloride: 106 mmol/L (ref 98–111)
Creatinine, Ser: 1.02 mg/dL — ABNORMAL HIGH (ref 0.44–1.00)
GFR calc Af Amer: >60 mL/min (ref >60)
GFR calc non Af Amer: 56 mL/min — ABNORMAL LOW (ref >60)
Glucose, Bld: 90 mg/dL (ref 70–99)
Potassium: 3.3 mmol/L — ABNORMAL LOW (ref 3.5–5.1)
Sodium: 140 mmol/L (ref 135–145)

## 2019-04-07 LAB — CBC
HCT: 39.9 % (ref 36.0–46.0)
Hemoglobin: 12.6 g/dL (ref 12.0–15.0)
MCH: 26.1 pg (ref 26.0–34.0)
MCHC: 31.6 g/dL (ref 30.0–36.0)
MCV: 82.8 fL (ref 80.0–100.0)
Platelets: 263 10*3/uL (ref 150–400)
RBC: 4.82 MIL/uL (ref 3.87–5.11)
RDW: 16.9 % — ABNORMAL HIGH (ref 11.5–15.5)
WBC: 3.9 10*3/uL — ABNORMAL LOW (ref 4.0–10.5)
nRBC: 0 % (ref 0.0–0.2)

## 2019-04-07 LAB — TROPONIN I (HIGH SENSITIVITY)
Troponin I (High Sensitivity): 4 ng/L (ref <18)
Troponin I (High Sensitivity): 5 ng/L (ref <18)

## 2019-04-07 MED ORDER — LEVALBUTEROL HCL 0.63 MG/3ML IN NEBU: 0.6300 mg | INHALATION_SOLUTION | RESPIRATORY_TRACT | 12 refills | Status: DC | PRN

## 2019-04-07 MED ORDER — SODIUM CHLORIDE 0.9% FLUSH: 3.0000 mL | Freq: Once | INTRAVENOUS | Status: DC

## 2019-04-07 NOTE — Progress Notes (Signed)
   CC: Rash, medication refill  HPI:  [name] is a 68 y.o. female with PMHx listed below who presents for medication refills and pruritic rash on RLE. Please see problem based charting for further details.   Past Medical History:  Diagnosis Date  . A-fib ([place])   . Allergy    seasonal  . Asthma   . Chronic anticoagulation   . Eczema   . GERD (gastroesophageal reflux disease)   . History of kidney stones   . Hypertension   . PAF (paroxysmal atrial fibrillation) ([place]) 11/26/2015   April/May 2017: Multiple ED visits for A. Fib.  02/12/16: Improved on diltiazem IV. Cardiology consulted in the ED and discharge patient with diltiazem XR 120 mg once daily with 60 mg by mouth every 6 hours as needed for symptomatic palpitations, flecainide 50 mg twice daily. Referred to the atrial fibrillation clinic.  02/17/16: Seen in the atrial fibrillation clinic by NP Roderic Palau. Normal sin   Review of Systems:    Review of Systems  Constitutional: Negative for chills and fever.  Respiratory: Negative for cough and wheezing.   Cardiovascular: Negative for chest pain and palpitations.  Gastrointestinal: Positive for constipation.  Musculoskeletal: Positive for myalgias.  Skin: Positive for itching and rash.  Neurological: Negative for dizziness and headaches.    Physical Exam:  Vitals:   04/04/19 0946 04/04/19 1001  BP: (!) 158/87 (!) 146/81  Pulse: 71 64  Temp: 98.1 F (36.7 C)   TempSrc: Oral   SpO2: 100%   Weight: 154 lb 14.4 oz (70.3 kg)     Physical Exam Constitutional:      General: She is not in acute distress.    Appearance: Normal appearance.  Eyes:     Conjunctiva/sclera: Conjunctivae normal.  Cardiovascular:     Rate and Rhythm: Normal rate and regular rhythm.  Pulmonary:     Effort: Pulmonary effort is normal.     Breath sounds: Normal breath sounds.  Abdominal:     General: There is no distension.     Palpations: Abdomen is soft.     Tenderness: There is no  abdominal tenderness.  Musculoskeletal:     Right lower leg: No edema.     Left lower leg: No edema.  Skin:    Comments: Macular rash on right lateral calf with surrounding excoriations   Neurological:     General: No focal deficit present.     Mental Status: She is alert and oriented to person, place, and time.     Assessment & Plan:   See Encounters Tab for problem based charting.  Patient discussed with Dr. Dareen Piano

## 2019-04-07 NOTE — Assessment & Plan Note (Signed)
Upon chart review this has been a chronic issue for her. Dermatology referral had been placed, but she has not been able to make an appointment.  Requests something for pruritis today. Rx sent for benadryl cream.

## 2019-04-07 NOTE — Assessment & Plan Note (Signed)
Patient has been on low-dose potassium supplementation. No obvious etiology for low K. She is not on a diuretic and denies diarrhea. BMP today with potassium on low-end of normal. Will send refill for potassium supplement. May benefit from further hypokalemia work-up with PCP.

## 2019-04-07 NOTE — Discharge Instructions (Addendum)
For your rash you can apply Aquaphor, which you can buy over the counter.  Apply after taking a bath or shower.  You can apply multiple times a day.

## 2019-04-07 NOTE — ED Triage Notes (Signed)
The pt is c/o sob for several days  She has a history of asthma and she has not had  Trouble for a long time.  She has no meds for her nebulizer machine no respiratory distress  No wheezeds

## 2019-04-07 NOTE — ED Provider Notes (Signed)
[facility] EMERGENCY DEPARTMENT Provider Note   CSN: [id number] Arrival date & time: 04/07/19  1516    History   Chief Complaint Chief Complaint  Patient presents with  . Shortness of Breath    HPI [name] is a 68 y.o. female.      Shortness of Breath Severity:  Mild Onset quality:  Gradual Duration:  3 days Timing:  Intermittent Chronicity:  Recurrent Context: weather changes   Exacerbated by: Night time. Associated symptoms: rash and wheezing   Associated symptoms: no abdominal pain, no chest pain, no claudication, no cough, no diaphoresis, no ear pain, no fever, no headaches, no hemoptysis, no neck pain, no PND, no sore throat, no sputum production, no syncope, no swollen glands and no vomiting     Past Medical History:  Diagnosis Date  . A-fib ([place])   . Allergy    seasonal  . Asthma   . Chronic anticoagulation   . Eczema   . GERD (gastroesophageal reflux disease)   . History of kidney stones   . Hypertension   . PAF (paroxysmal atrial fibrillation) ([place]) 11/26/2015   April/May 2017: Multiple ED visits for A. Fib.  02/12/16: Improved on diltiazem IV. Cardiology consulted in the ED and discharge patient with diltiazem XR 120 mg once daily with 60 mg by mouth every 6 hours as needed for symptomatic palpitations, flecainide 50 mg twice daily. Referred to the atrial fibrillation clinic.  02/17/16: Seen in the atrial fibrillation clinic by NP Roderic Palau. Normal sin    Patient Active Problem List   Diagnosis Date Noted  . Gallstones 12/08/2018  . Choledocholithiasis   . Vaginal discharge 10/11/2018  . Vaginal bleeding 10/11/2018  . History of radiofrequency ablation (RFA) procedure for cardiac arrhythmia   . Asthma exacerbation 08/28/2018  . Chronic congestion of paranasal sinus 06/14/2018  . Primary osteoarthritis involving multiple joints 06/14/2018  . Acute pain of left knee 05/15/2018  . Hyperpigmented skin lesion 09/09/2017  .  Osteoporosis without current pathological fracture 04/15/2017  . Normocytic anemia 10/19/2016  . Hyperlipidemia 03/18/2016  . Special screening for malignant neoplasms, colon 03/17/2016  . Cervical cancer screening 03/17/2016  . Assistance with transportation 03/17/2016  . HTN (hypertension), benign   . GERD (gastroesophageal reflux disease)   . PAF (paroxysmal atrial fibrillation) ([place]) 11/26/2015  . Chronic anticoagulation 11/26/2015  . Marijuana abuse 06/11/2015  . Hypokalemia 01/07/2012    Past Surgical History:  Procedure Laterality Date  . ATRIAL FIBRILLATION ABLATION N/A 09/30/2017   Procedure: ATRIAL FIBRILLATION ABLATION;  Surgeon: Thompson Grayer, MD;  Location: [facility];  Service: Cardiovascular;  Laterality: N/A;  . CHOLECYSTECTOMY N/A 12/08/2018   Procedure: LAPAROSCOPIC CHOLECYSTECTOMY WITH INTRAOPERATIVE CHOLANGIOGRAM;  Surgeon: Jovita Kussmaul, MD;  Location: [facility];  Service: General;  Laterality: N/A;  . ERCP N/A 12/09/2018   Procedure: ENDOSCOPIC RETROGRADE CHOLANGIOPANCREATOGRAPHY (ERCP);  Surgeon: Carol Ada, MD;  Location: [facility];  Service: Endoscopy;  Laterality: N/A;  . SPHINCTEROTOMY  12/09/2018   Procedure: SPHINCTEROTOMY;  Surgeon: Carol Ada, MD;  Location: [facility];  Service: Endoscopy;;  . TONSILLECTOMY    . TUBAL LIGATION       OB History   No obstetric history on file.      Home Medications    Prior to Admission medications   Medication Sig Start Date End Date Taking? Authorizing Provider  diltiazem (CARDIZEM) 60 MG tablet Take 60 mg by mouth every 6 (six) hours as needed (Rapid AFIB over  100).    [provider]  diltiazem (CARTIA XT) 120 MG 24 hr capsule Take 1 capsule (120 mg total) by mouth 2 (two) times daily. 10/23/18   Sherran Needs, NP  diphenhydrAMINE (BENADRYL) 2 % cream Apply topically 3 (three) times daily as needed for itching. 04/04/19   Bloomfield, Carley D, DO  fluticasone (FLOVENT HFA) 44 MCG/ACT  inhaler Inhale 1 puff into the lungs daily.     [provider]  HYDROcodone-acetaminophen (NORCO/VICODIN) 5-325 MG tablet Take 1 tablet by mouth every 6 (six) hours as needed. 12/10/18   Johnathan Hausen, MD  hydrOXYzine (ATARAX/VISTARIL) 25 MG tablet Take 1 tablet (25 mg total) by mouth every 6 (six) hours. 03/02/19   Kinnie Feil, PA-C  levalbuterol (XOPENEX HFA) 45 MCG/ACT inhaler INHALE 1 PUFF INTO LUNGS EVERY 6 HOURS AS NEEDED FOR SHORTNESS OF BREATH 04/04/19   Bloomfield, Carley D, DO  levalbuterol (XOPENEX) 0.63 MG/3ML nebulizer solution Take 3 mLs (0.63 mg total) by nebulization every 2 (two) hours as needed for wheezing or shortness of breath. 04/07/19   Tillie Fantasia, MD  LINZESS 145 MCG CAPS capsule TAKE 1 CAPSULE(145 MCG) BY MOUTH DAILY BEFORE BREAKFAST 02/05/19   [name], MD  losartan (COZAAR) 50 MG tablet TAKE 1 TABLET(50 MG) BY MOUTH DAILY 01/01/19   [name], MD  magnesium hydroxide (MILK OF MAGNESIA) 400 MG/5ML suspension Take 15-30 mLs by mouth at bedtime as needed for mild constipation. 11/01/17   Harris, Abigail, PA-C  montelukast (SINGULAIR) 10 MG tablet TAKE 1 TABLET(10 MG) BY MOUTH AT BEDTIME 01/12/19   [name], MD  Multiple Vitamin (MULTIVITAMIN WITH MINERALS) TABS tablet Take 1 tablet by mouth every other day.    [provider]  ondansetron (ZOFRAN ODT) 4 MG disintegrating tablet Take 1 tablet (4 mg total) by mouth every 8 (eight) hours as needed for nausea or vomiting. 11/21/18   Duffy Bruce, MD  potassium chloride SA (K-DUR) 10 MEQ tablet Take 2 tablets (20 mEq total) by mouth 2 (two) times daily. 04/04/19 05/04/19  Bloomfield, Nila Nephew D, DO  rivaroxaban (XARELTO) 20 MG TABS tablet Take 1 tablet (20 mg total) by mouth daily with supper. 01/01/19   Sherran Needs, NP  sorbitol 70 % solution Take 15 mLs by mouth daily as needed. 04/04/19   Bloomfield, Carley D, DO  sucralfate (CARAFATE) 1 g tablet Take 1 tablet (1 g total) by mouth 4 (four)  times daily. 10/16/18 10/16/19  Oval Linsey, MD  tiotropium (SPIRIVA) 18 MCG inhalation capsule Place 1 capsule (18 mcg total) into inhaler and inhale daily. 01/01/19   [name], MD  triamcinolone cream (KENALOG) 0.1 % Apply 1 application topically 2 (two) times daily. 04/04/19   Modena Nunnery D, DO    Family History Family History  Problem Relation Age of Onset  . Heart attack Mother        [name] age  . Breast cancer Sister        Late 49s; now s/p mastectomy   . Lung cancer Brother        x 2  . Cancer Sister   . Cancer Sister   . Cancer Sister   . Colon cancer Neg Hx   . Colon polyps Neg Hx   . Esophageal cancer Neg Hx   . Rectal cancer Neg Hx   . Stomach cancer Neg Hx     Social History Social History   Tobacco Use  . Smoking status: Former Smoker  Quit date: 10/24/2015    Years since quitting: 3.4  . Smokeless tobacco: Never Used  Substance Use Topics  . Alcohol use: Yes  . Drug use: Not Currently    Types: Marijuana    Comment: Smoked marijuana in the past.     Allergies   Albuterol, Percocet [oxycodone-acetaminophen], Celecoxib, and Protonix [pantoprazole sodium]   Review of Systems Review of Systems  Constitutional: Negative for diaphoresis and fever.  HENT: Negative for ear pain and sore throat.   Respiratory: Positive for shortness of breath and wheezing. Negative for cough, hemoptysis and sputum production.   Cardiovascular: Negative for chest pain, claudication, syncope and PND.  Gastrointestinal: Negative for abdominal pain and vomiting.  Genitourinary: Negative for dysuria.  Musculoskeletal: Negative for neck pain.  Skin: Positive for rash.       Eczema right leg, chronic  Neurological: Negative for headaches.  All other systems reviewed and are negative.    Physical Exam Updated Vital Signs BP (!) 151/88   Pulse 68   Temp 98.4 F (36.9 C) (Oral)   Resp 20   Ht 5\' 7"  (1.702 m)   Wt 71.7 kg   SpO2 99%   BMI 24.75 kg/m    Physical Exam Vitals signs and nursing note reviewed.  Constitutional:      General: She is not in acute distress.    Appearance: She is well-developed.  HENT:     Head: Normocephalic and atraumatic.  Eyes:     Conjunctiva/sclera: Conjunctivae normal.  Neck:     Musculoskeletal: Neck supple.  Cardiovascular:     Rate and Rhythm: Normal rate and regular rhythm.     Heart sounds: No murmur.  Pulmonary:     Effort: Pulmonary effort is normal. No respiratory distress.     Breath sounds: Normal breath sounds. No decreased breath sounds, wheezing, rhonchi or rales.  Abdominal:     Palpations: Abdomen is soft.     Tenderness: There is no abdominal tenderness.  Skin:    General: Skin is warm and dry.  Neurological:     Mental Status: She is alert.      ED Treatments / Results  Labs (all labs ordered are listed, but only abnormal results are displayed) Labs Reviewed  BASIC METABOLIC PANEL - Abnormal; Notable for the following components:      Result Value   Potassium 3.3 (*)    Creatinine, Ser 1.02 (*)    GFR calc non Af Amer 56 (*)    All other components within normal limits  CBC - Abnormal; Notable for the following components:   WBC 3.9 (*)    RDW 16.9 (*)    All other components within normal limits  TROPONIN I (HIGH SENSITIVITY)  TROPONIN I (HIGH SENSITIVITY)    EKG EKG Interpretation  Date/Time:  Saturday April 07 2019 15:24:48 EDT Ventricular Rate:  80 PR Interval:  120 QRS Duration: 86 QT Interval:  380 QTC Calculation: 438 R Axis:   59 Text Interpretation:  Sinus rhythm with occasional Premature ventricular complexes Artifact Confirmed by Elnora Morrison [phone]) on 04/07/2019 6:27:42 PM   Radiology Dg Chest 2 View  Result Date: 04/07/2019 CLINICAL DATA:  Shortness of breath for several days. EXAM: CHEST - 2 VIEW COMPARISON:  Chest x-ray dated 10/15/2018. FINDINGS: Heart size and mediastinal contours are within normal limits. Lungs are hyperexpanded.  Streaky opacities at the RIGHT lung base, most likely mild atelectasis. Lungs otherwise clear. No pleural effusion or pneumothorax seen osseous structures about  the chest are unremarkable. IMPRESSION: 1. Hyperexpanded lungs indicating COPD/asthma. 2. Streaky opacities at the RIGHT lung base, most likely mild atelectasis. If febrile, early developing pneumonia could not be excluded. Electronically Signed   By: Franki Cabot M.D.   On: 04/07/2019 16:15    Procedures Procedures (including critical care time)  Medications Ordered in ED Medications - No data to display   Initial Impression / Assessment and Plan / ED Course  I have reviewed the triage vital signs and the nursing notes.  Pertinent labs & imaging results that were available during my care of the patient were reviewed by me and considered in my medical decision making (see chart for details).        Ms. Diekman is a 68 year old female with a history of paroxysmal atrial fibrillation, hypertension, asthma, GERD, chronic anticoagulation on Xarelto.  Presents today with symptoms consistent with asthma exacerbation.  Work appears unremarkable.  Chest x-ray suggestive of asthma.  She is well-appearing on exam.  States that exacerbation resolved upon waiting.  Her primary concern is that she needs refill of her nebulized treatment.  I provided prescription for the same.  Given that she is so well-appearing now with negative work-up, I have low suspicion for ACS.  Reviewed with her that we would typically get a second troponin.  She expressed strong desire to return home.  She is convinced that this is not her heart and that she does not need to stay any longer.  I think this is reasonable.  Engaged in shared decision-making and she vocalized understanding and assumption of risk with discharge.  She does not have any wheezing does not need treatment at this time.  Will follow up with PCP as needed.  Patient vocalized understanding and agreement  with plan. Had no other questions or concerns. Was given relevant verbal and written information regarding aftercare and return precautions and discharged in good condition.    Final Clinical Impressions(s) / ED Diagnoses   Final diagnoses:  Moderate asthma with exacerbation, unspecified whether persistent  Eczema, unspecified type    ED Discharge Orders         Ordered    levalbuterol (XOPENEX) 0.63 MG/3ML nebulizer solution  Every 2 hours PRN     04/07/19 Baruch Merl, MD 04/08/19 [id number]    Elnora Morrison, MD 04/08/19 2355

## 2019-04-09 NOTE — Progress Notes (Signed)
Internal Medicine Clinic Attending  Case discussed with Dr. Bloomfield at the time of the visit.  We reviewed the resident's history and exam and pertinent patient test results.  I agree with the assessment, diagnosis, and plan of care documented in the resident's note.  

## 2019-04-10 ENCOUNTER — Other Ambulatory Visit: Payer: Self-pay

## 2019-04-10 NOTE — Telephone Encounter (Signed)
Questions about meds, please call pt back. 

## 2019-04-10 NOTE — Telephone Encounter (Signed)
rtc to pt, got vmail, lm for rtc 

## 2019-04-10 NOTE — Telephone Encounter (Signed)
Returning a call to the nurse. Please call pt back.

## 2019-04-11 NOTE — Telephone Encounter (Signed)
Spoke to pt this am she had questions r/t taking her K+, went over directions and she repeated back

## 2019-04-29 IMAGING — CT CT RENAL STONE PROTOCOL
2 of 4 series · 16 of 46 positions shown, 18 images · non-contrast
Comparison: CT scan of the abdomen dated 01/23/2013 and CT scans of
the chest dated 09/28/2017 and 05/29/2017 and abdominal radiograph
dated 11/01/2017

CLINICAL DATA: Upper abdominal pain with nausea.  Hematuria.

EXAM:
CT ABDOMEN AND PELVIS WITHOUT CONTRAST
TECHNIQUE: Multidetector CT imaging of the abdomen and pelvis was performed
following the standard protocol without IV contrast.

[Series 3: ap without · axial · non-contrast · 0.64mm/px · z∈[+931,+1306]mm · 13 of 85 slices shown, 15 images]
[im 5/85  soft-tissue]
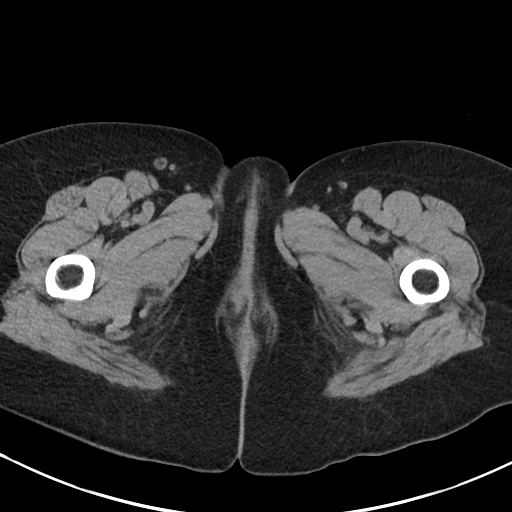
[im 5/85  bone]
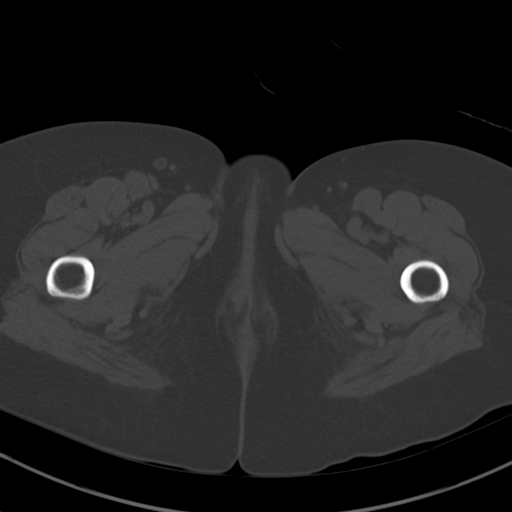
[im 14/85  soft-tissue]
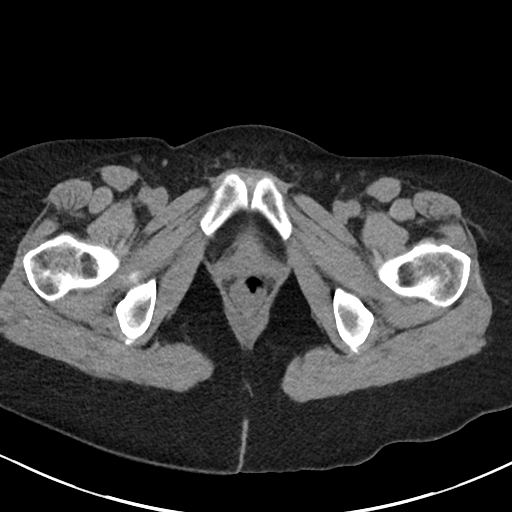
[im 18/85  soft-tissue]
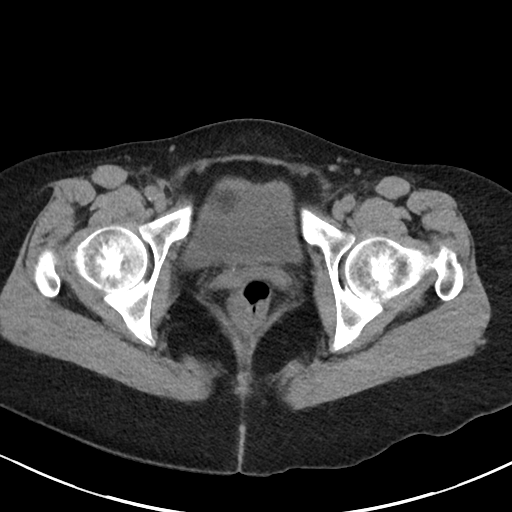
[im 23/85  soft-tissue]
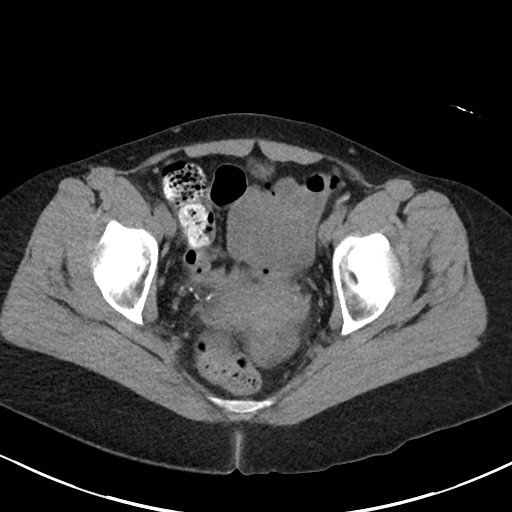
[im 31/85  soft-tissue]
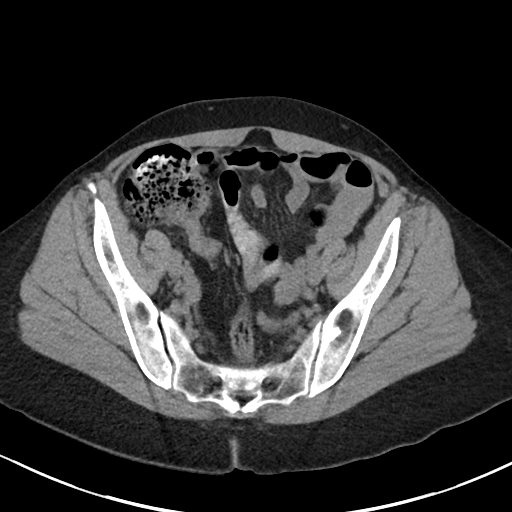
[im 36/85  soft-tissue]
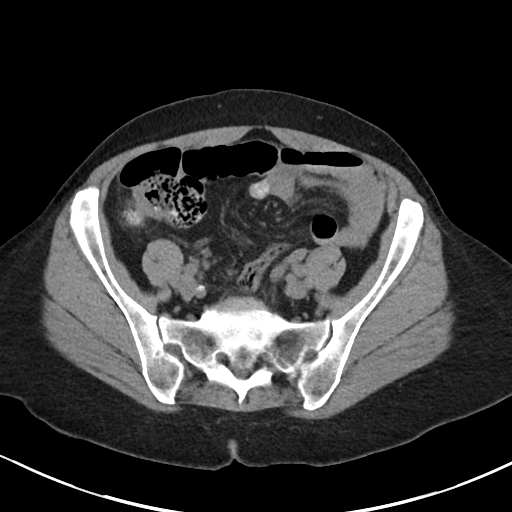
[im 45/85  soft-tissue]
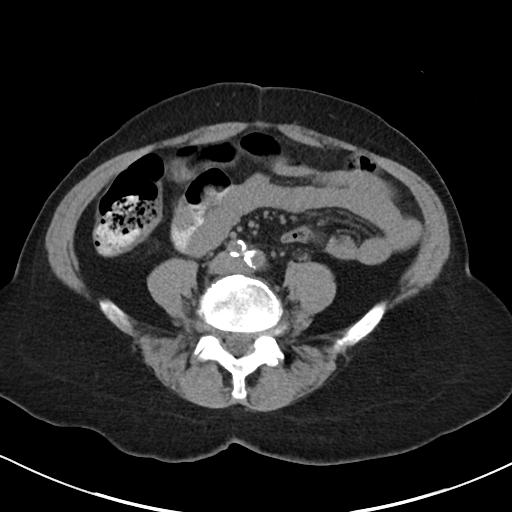
[im 49/85  soft-tissue]
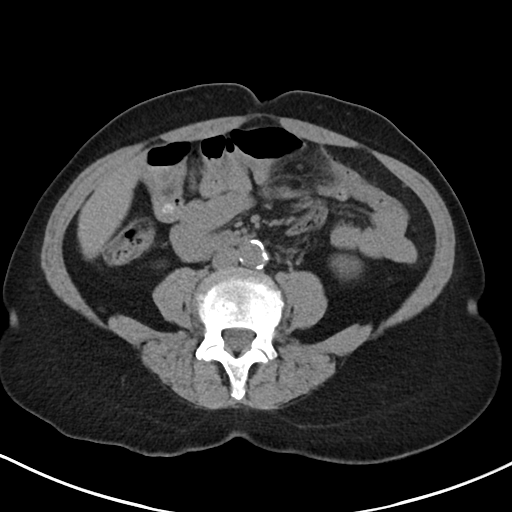
[im 54/85  soft-tissue]
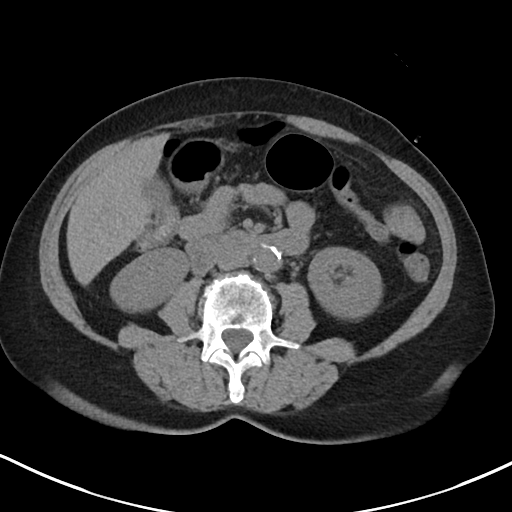
[im 54/85  bone]
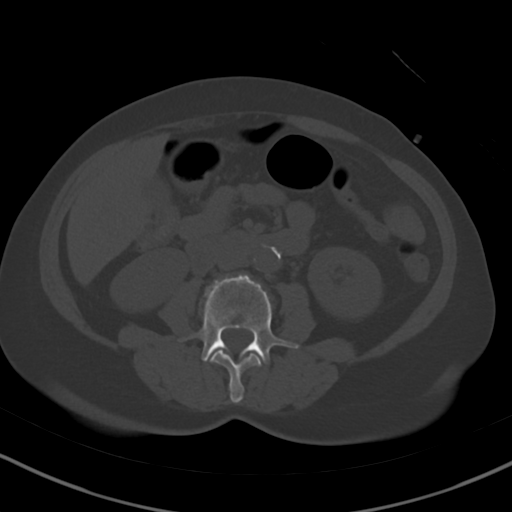
[im 62/85  soft-tissue]
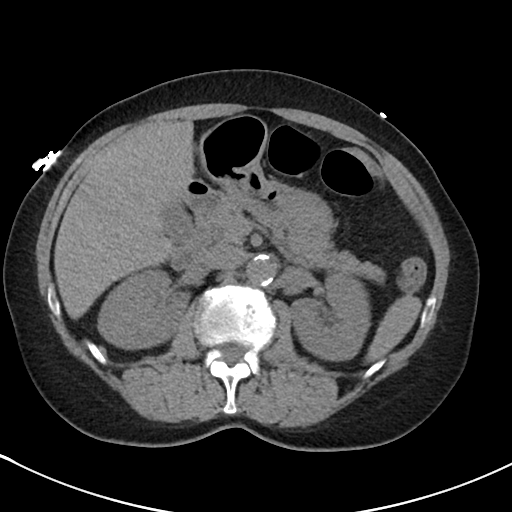
[im 67/85  soft-tissue]
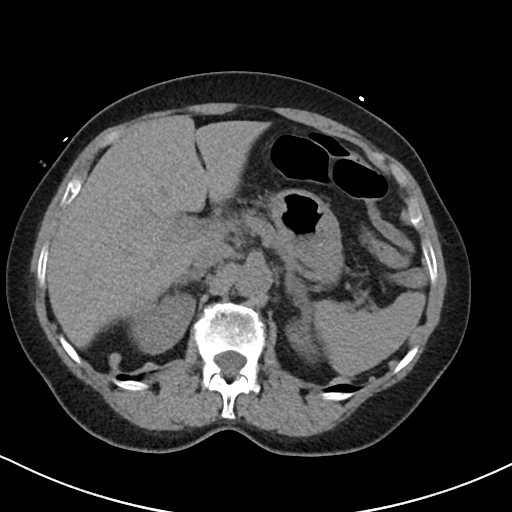
[im 71/85  soft-tissue]
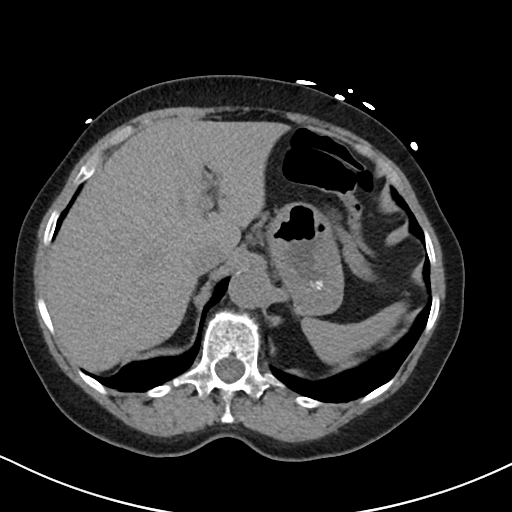
[im 80/85  soft-tissue]
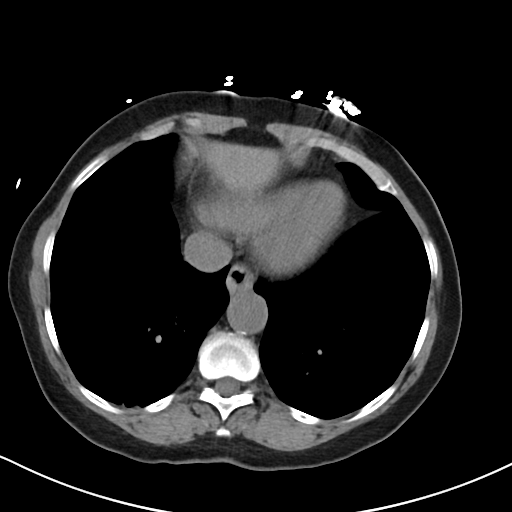

[Series 6: cor · coronal · 0.75mm/px · 3 of 86 slices shown]
[im 29/86  soft-tissue]
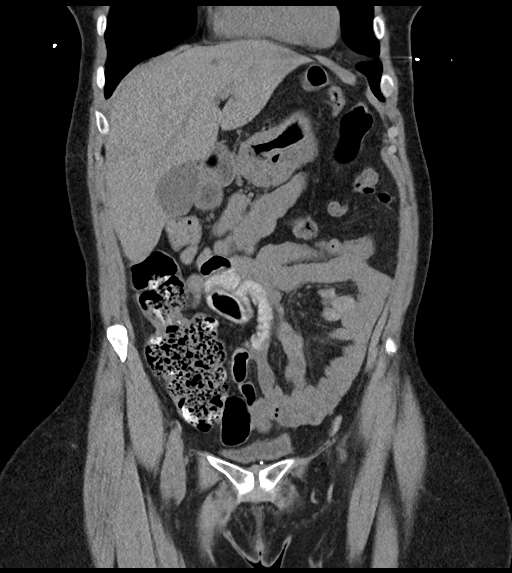
[im 38/86  soft-tissue]
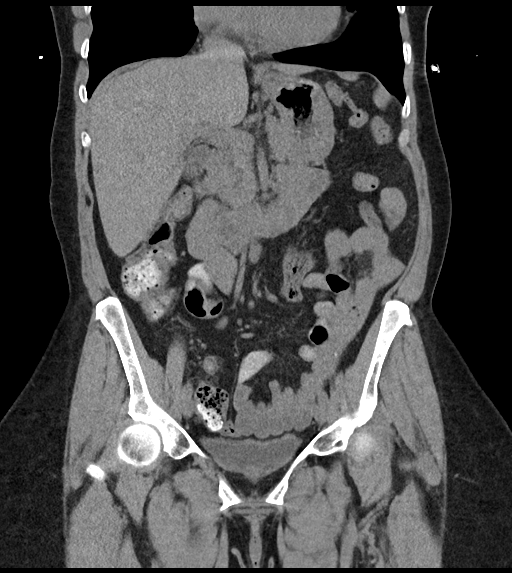
[im 48/86  soft-tissue]
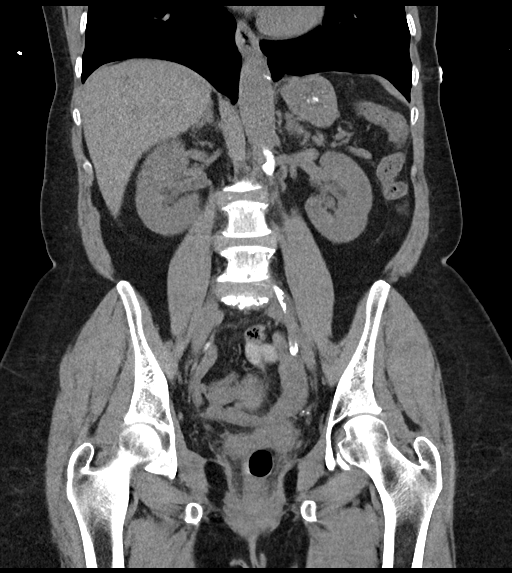

[16 of 46 positions shown; findings below may reference images not displayed]

FINDINGS: Lower chest: There is new marked peribronchial thickening in the
right lower lobe with small patchy areas of inflammatory disease.
Increased peribronchial thickening in the left lower lobe. Bilateral
bronchiectasis. Stable 4 mm nodule in the right lower lobe. Heart
size is normal. Small unchanged hiatal hernia.

Hepatobiliary: Multiple stable simple cysts in the dome of the
liver. Liver parenchyma is otherwise normal. Biliary tree is normal.

Pancreas: Unremarkable. No pancreatic ductal dilatation or
surrounding inflammatory changes.

Spleen: Normal in size without focal abnormality.

Adrenals/Urinary Tract: Adrenal glands are unremarkable. 1.5 mm
stone in the mid right kidney seen on image 51 of series 6 and image
31 of series 7. The kidneys otherwise appear normal. No
hydronephrosis. No ureteral or bladder calculi. Bladder is
unremarkable.

Stomach/Bowel: Stomach is within normal limits. Appendix appears
normal. No evidence of bowel wall thickening, distention, or
inflammatory changes.

Vascular/Lymphatic: Aortic atherosclerosis. No enlarged abdominal or
pelvic lymph nodes.

Reproductive: Uterus and bilateral adnexa are unremarkable.

Other: No abdominal wall hernia or abnormality. No abdominopelvic
ascites.

Musculoskeletal: No acute abnormalities. Multilevel degenerative
disc disease in the lumbar spine.
IMPRESSION: 1. No acute abnormality of the abdomen or pelvis.
2. Tiny nonobstructing stone in the mid right kidney.
3. Small hiatal hernia.
4. Prominent peribronchial thickening in the lower lobes, right
greater than left with patchy small areas of new inflammation in the
right lower lobe. Mild bibasilar bronchiectasis.
5.  Aortic Atherosclerosis ([id number].M).

## 2019-05-03 ENCOUNTER — Telehealth: Payer: Self-pay | Admitting: *Deleted

## 2019-05-03 NOTE — Telephone Encounter (Signed)
Pharmacy made aware

## 2019-05-03 NOTE — Telephone Encounter (Signed)
This is an over the counter drug. Can the pt purchase this at [facility] or another store? If not, I'm happy to prescribe. Thanks.

## 2019-05-03 NOTE — Telephone Encounter (Signed)
Requesting a refill on Cetirizine 10 mg Take 1 tab daily ; lasted refilled 12/04/18 Qty # 30 at [facility]. Thanks

## 2019-05-04 DIAGNOSIS — J45901 Unspecified asthma with (acute) exacerbation: Secondary | ICD-10-CM | POA: Diagnosis not present

## 2019-05-04 DIAGNOSIS — J4520 Mild intermittent asthma, uncomplicated: Secondary | ICD-10-CM | POA: Diagnosis not present

## 2019-05-13 IMAGING — CR DG CHEST 2V
2 series · 2 of 2 positions shown · non-contrast
Comparison: 11/21/2017

CLINICAL DATA: Sharp chest pain.

EXAM:
CHEST - 2 VIEW

[chest pa]
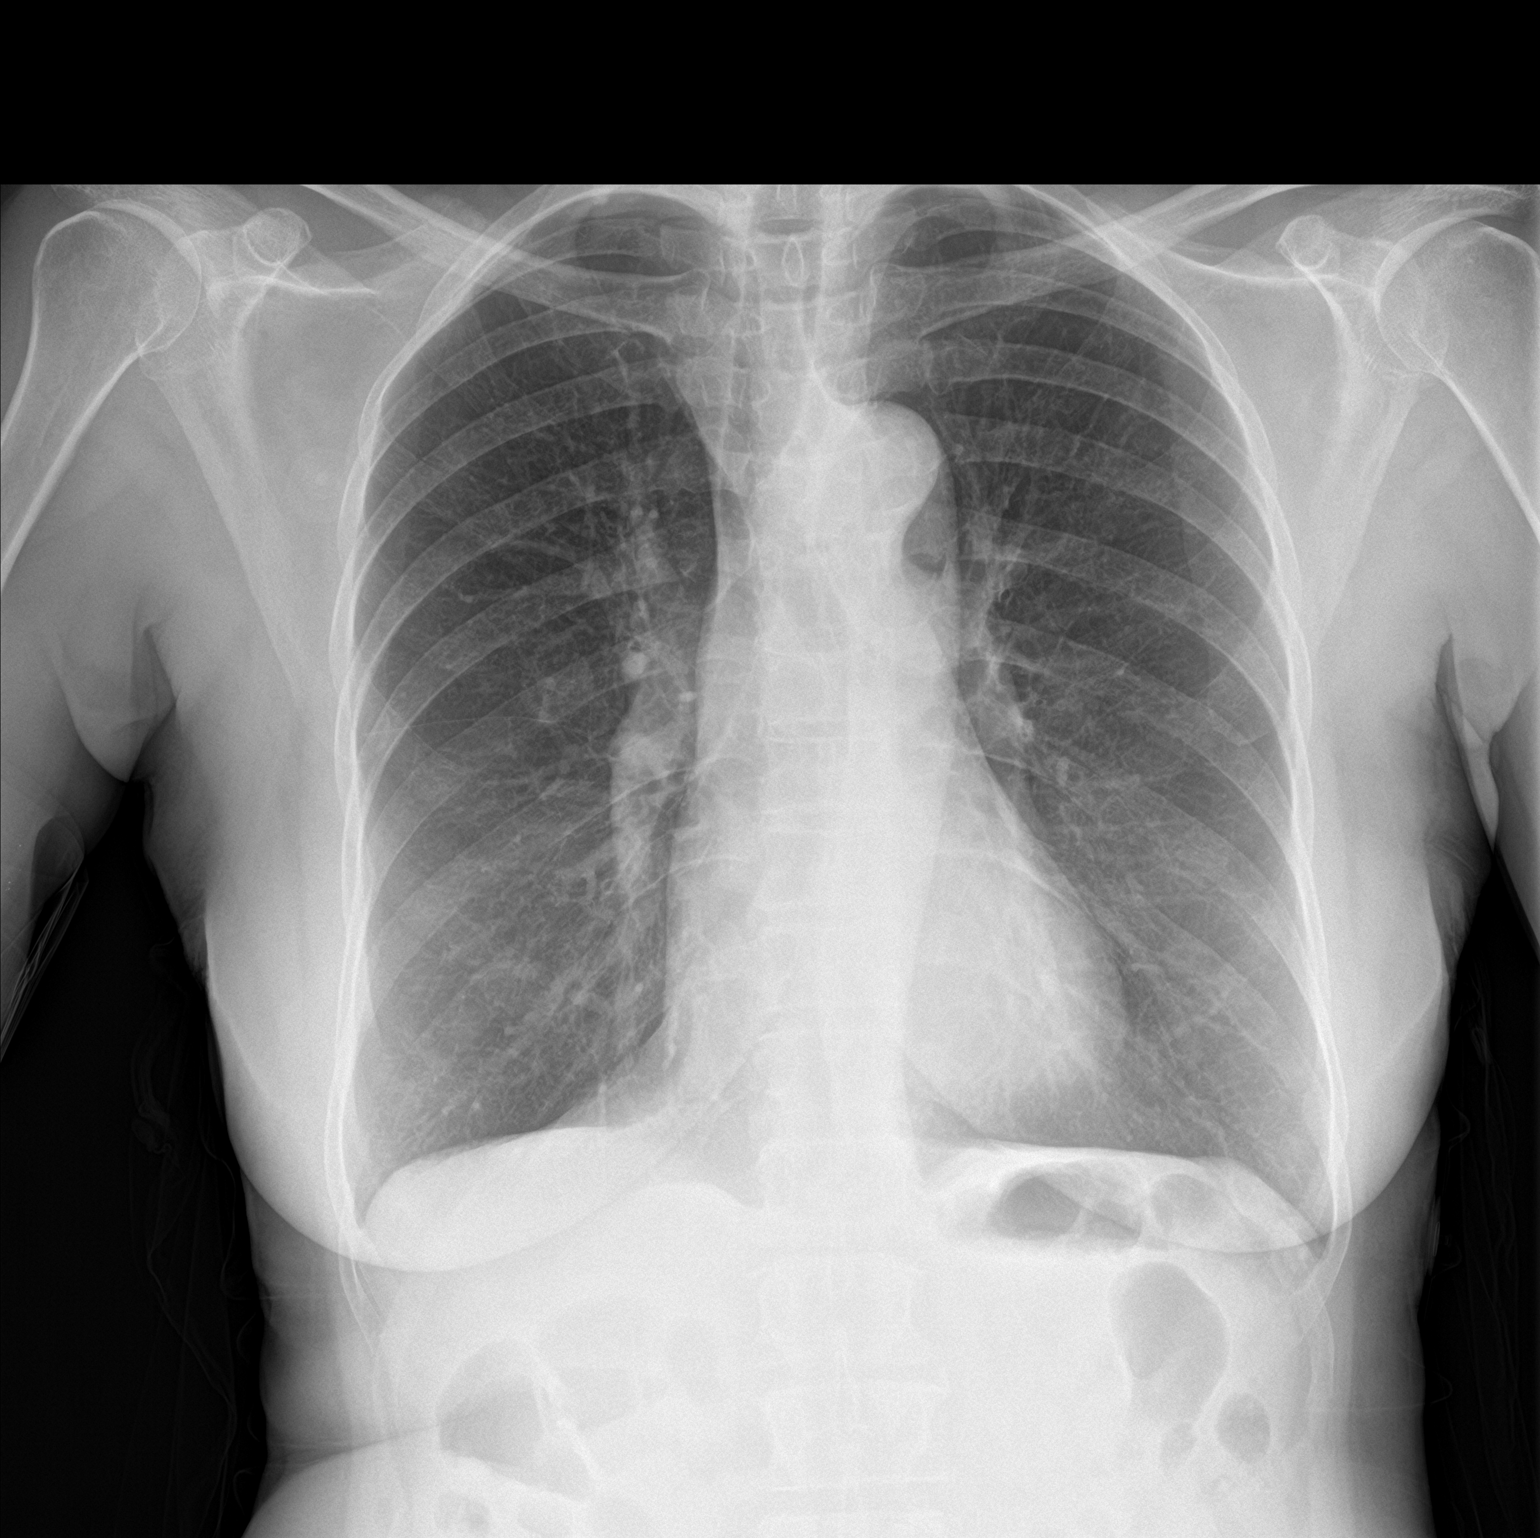

[chest lat]
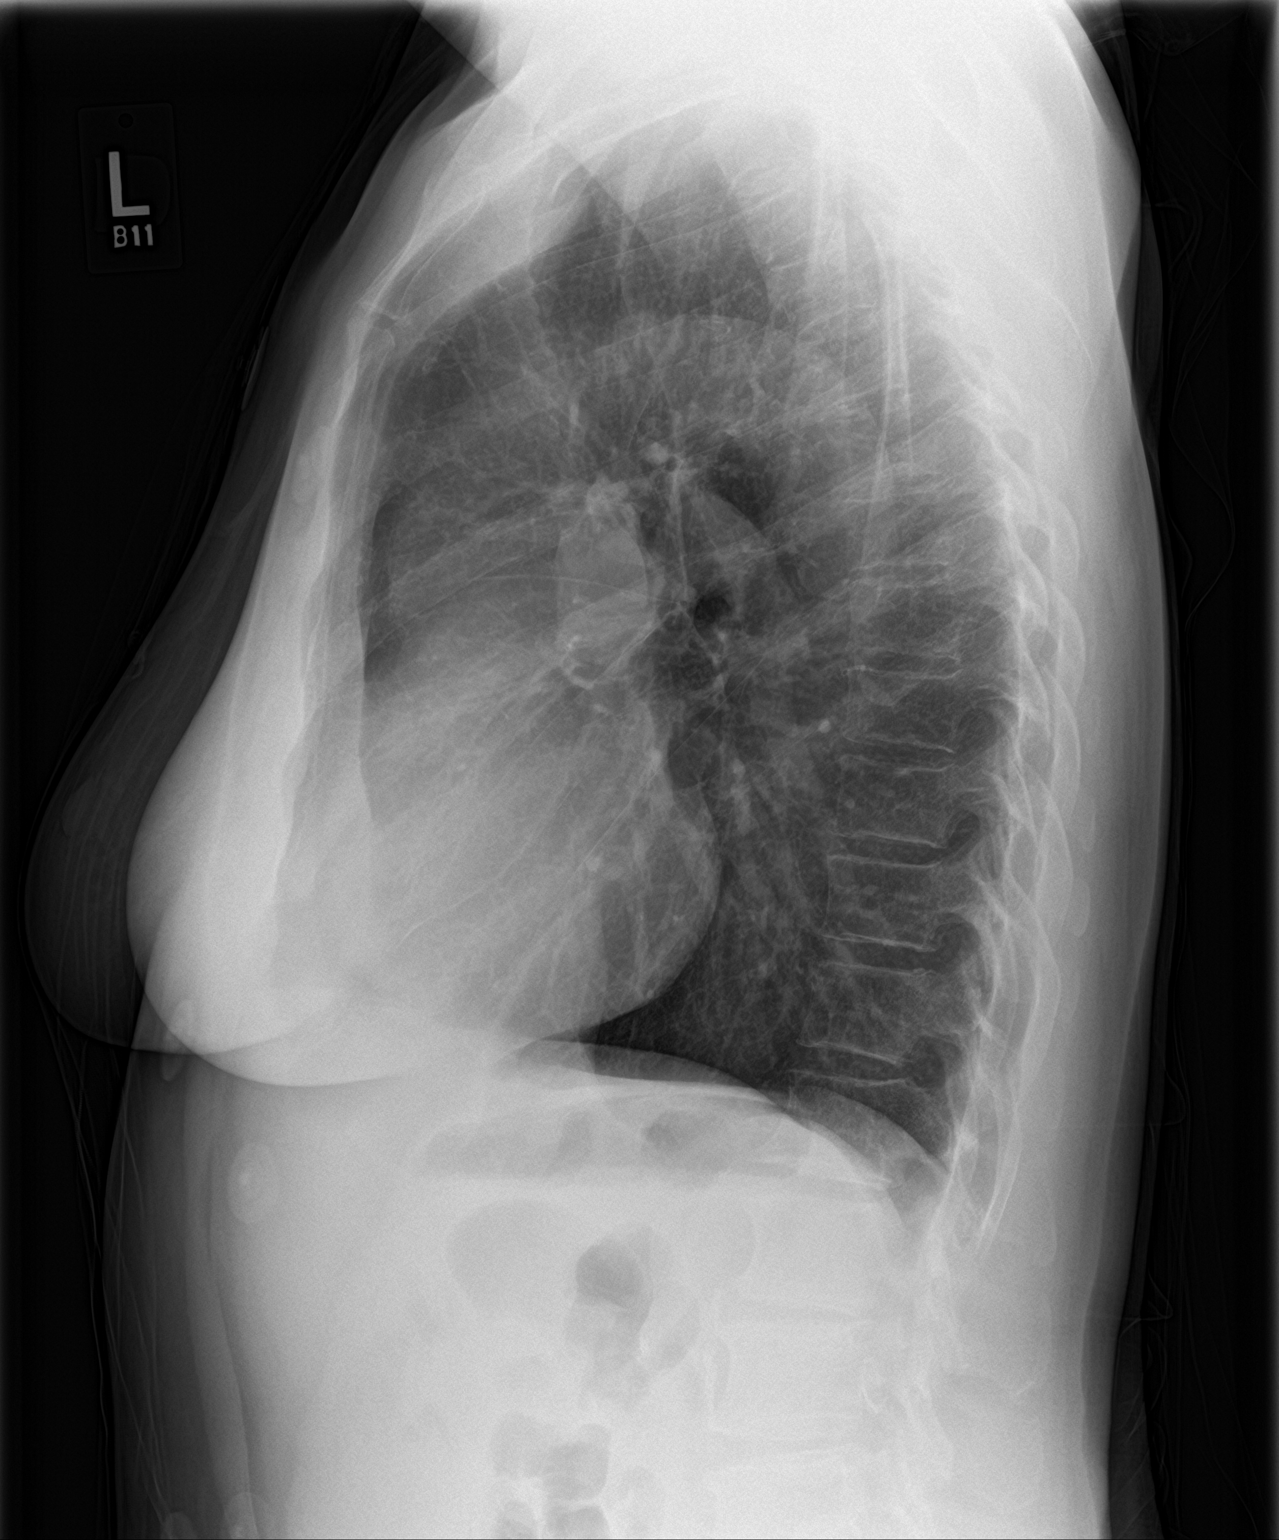

[2 of 2 positions shown; findings below may reference images not displayed]

FINDINGS: Cardiomediastinal silhouette is normal. Tortuosity of the aorta.
Mediastinal contours appear intact.

There is no evidence of focal airspace consolidation, pleural
effusion or pneumothorax.

Osseous structures are without acute abnormality. Soft tissues are
grossly normal.
IMPRESSION: No active cardiopulmonary disease.

## 2019-05-13 IMAGING — CT CT ABD-PELV W/ CM
2 of 5 series · 16 of 46 positions shown, 18 images · IV contrast (Omni 300)
Comparison: CT abdomen pelvis dated January 11, 2018.

CLINICAL DATA: Right lower quadrant and suprapubic abdominal pain.

EXAM:
CT ABDOMEN AND PELVIS WITH CONTRAST
TECHNIQUE: Multidetector CT imaging of the abdomen and pelvis was performed
using the standard protocol following bolus administration of
intravenous contrast.
CONTRAST:  100mL OMNIPAQUE IOHEXOL 300 MG/ML  SOLN

[Series 3: a/p w/ 5mm · axial · 0.89mm/px · z∈[+840,+1215]mm · 13 of 85 slices shown, 15 images]
[im 5/85  soft-tissue]
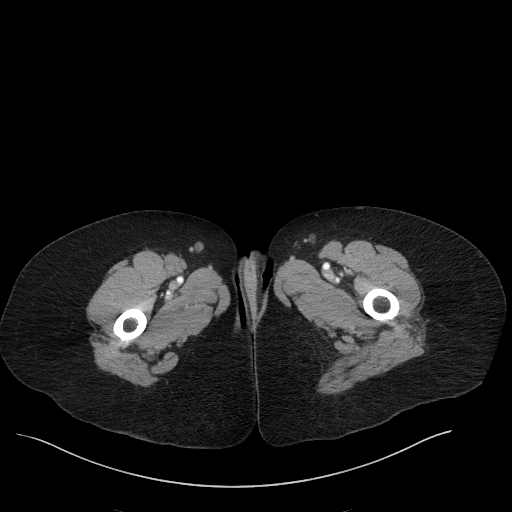
[im 5/85  bone]
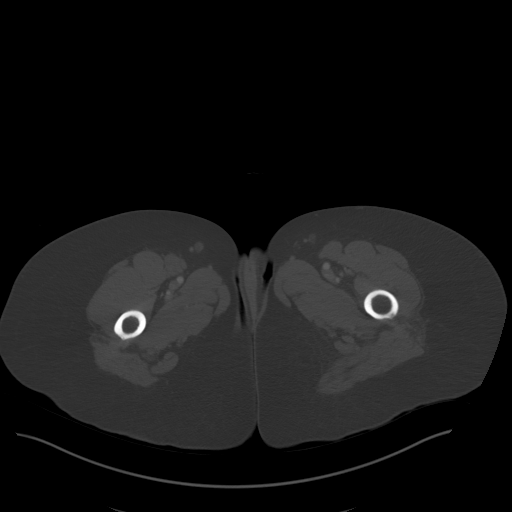
[im 14/85  soft-tissue]
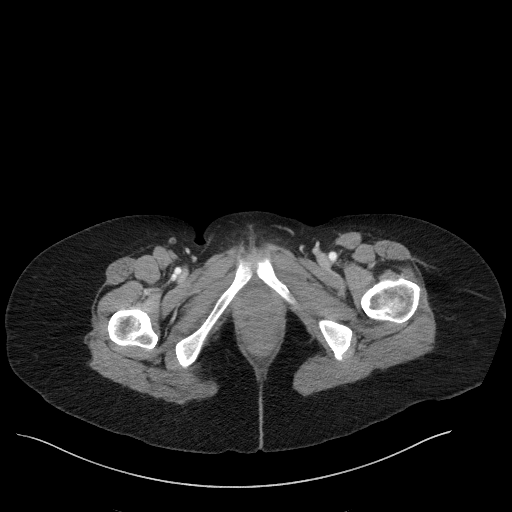
[im 18/85  soft-tissue]
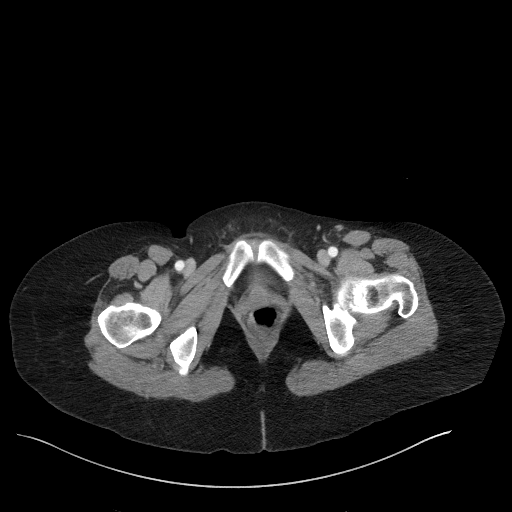
[im 23/85  soft-tissue]
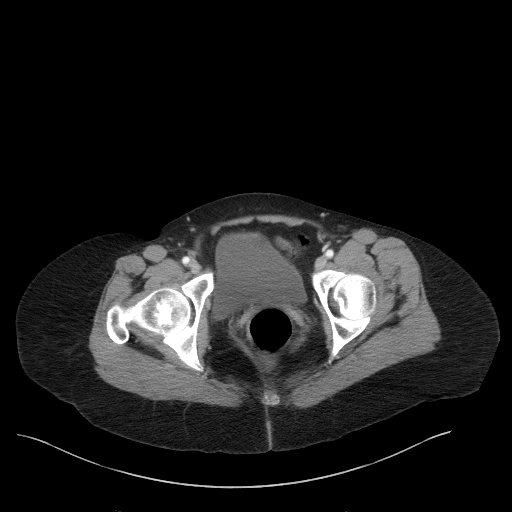
[im 31/85  soft-tissue]
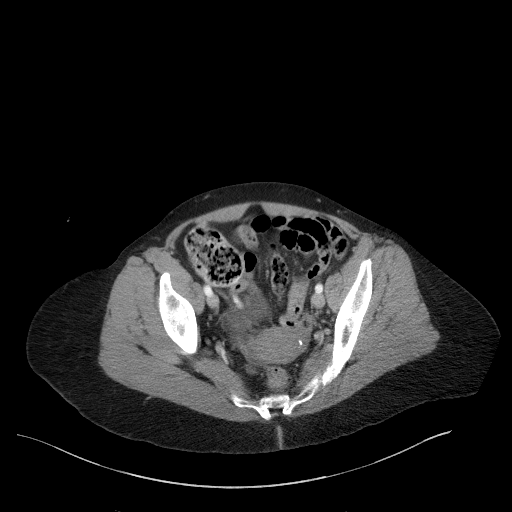
[im 36/85  soft-tissue]
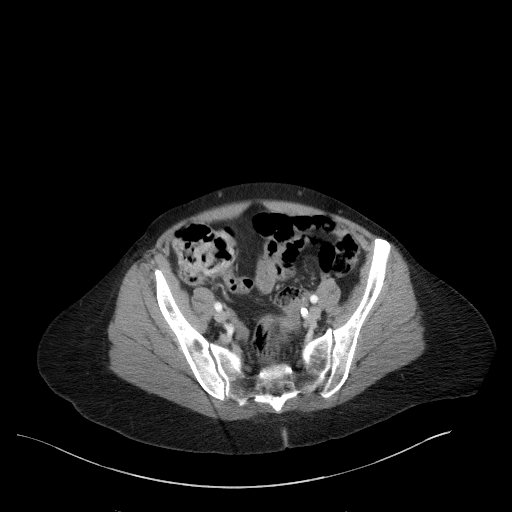
[im 45/85  soft-tissue]
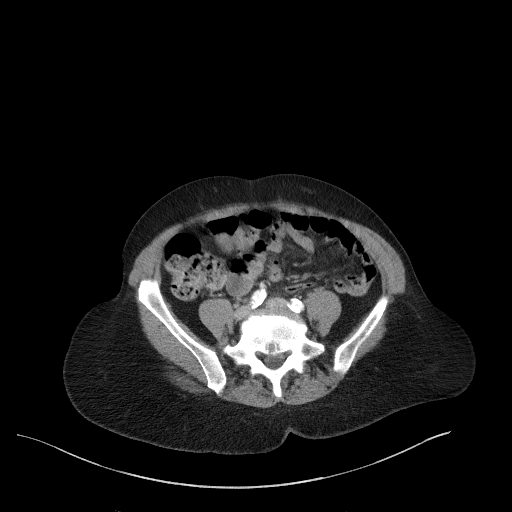
[im 49/85  soft-tissue]
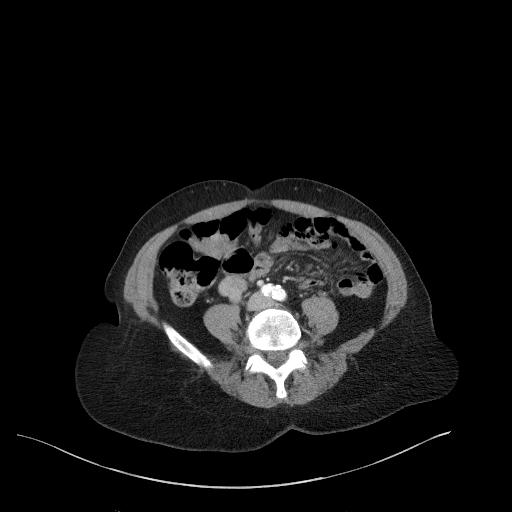
[im 54/85  soft-tissue]
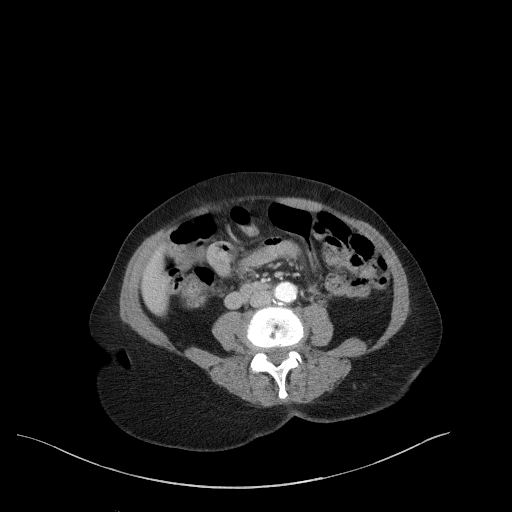
[im 54/85  bone]
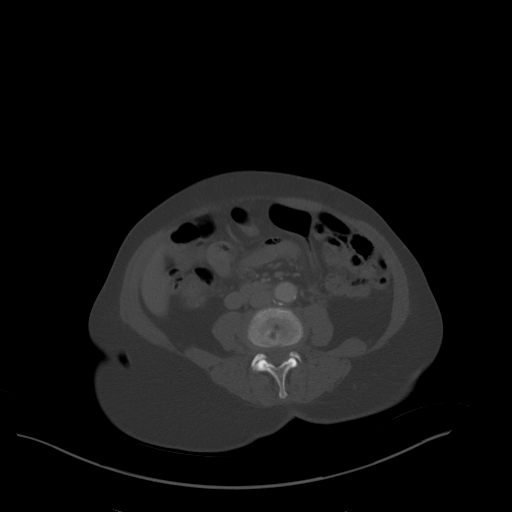
[im 62/85  soft-tissue]
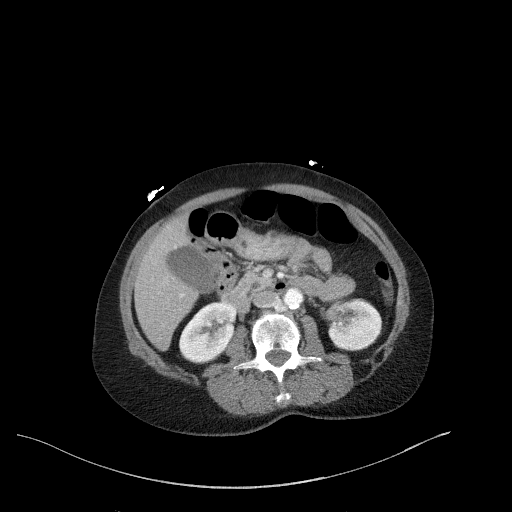
[im 67/85  soft-tissue]
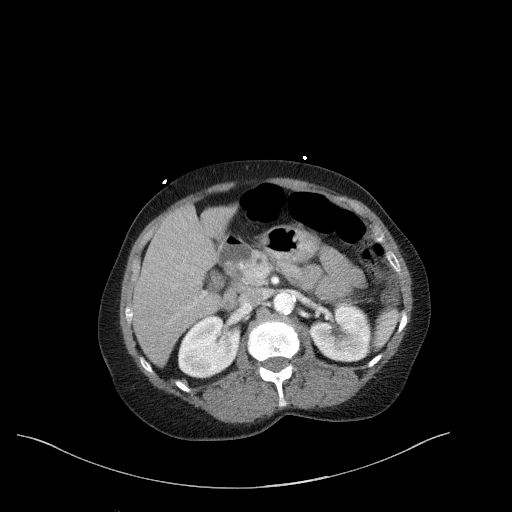
[im 71/85  soft-tissue]
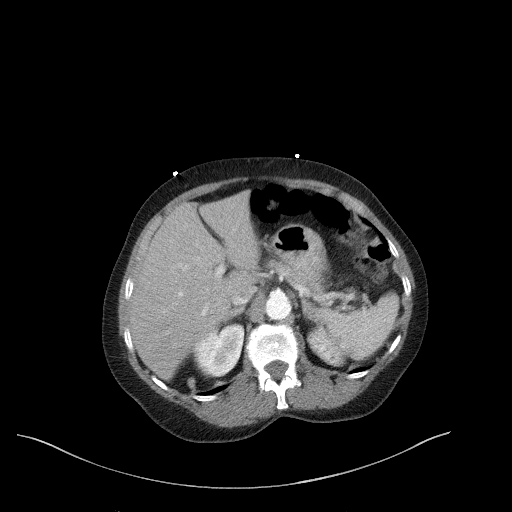
[im 80/85  soft-tissue]
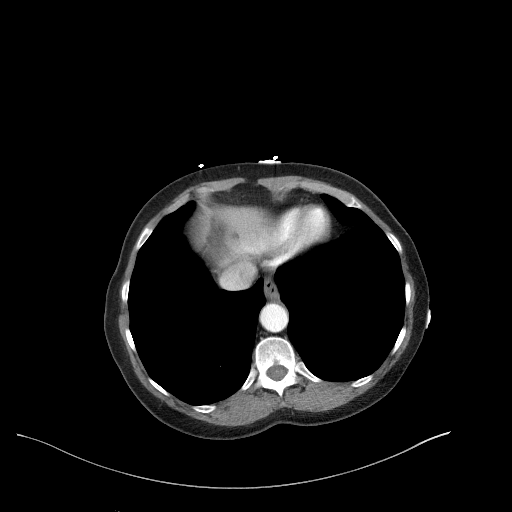

[Series 6: a/p w/ cor · coronal · 0.82mm/px · 3 of 148 slices shown]
[im 50/148  soft-tissue]
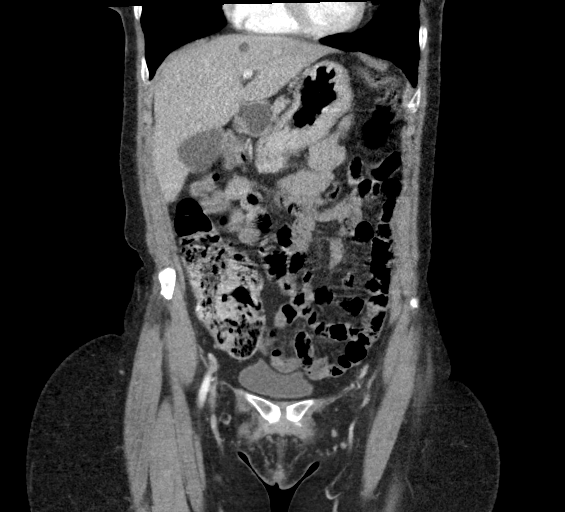
[im 66/148  soft-tissue]
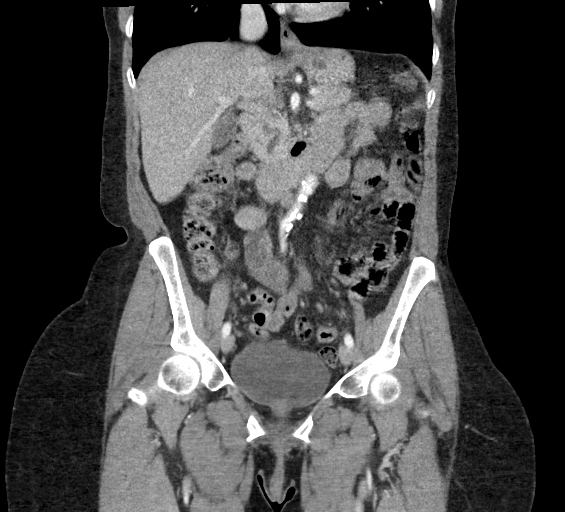
[im 82/148  soft-tissue]
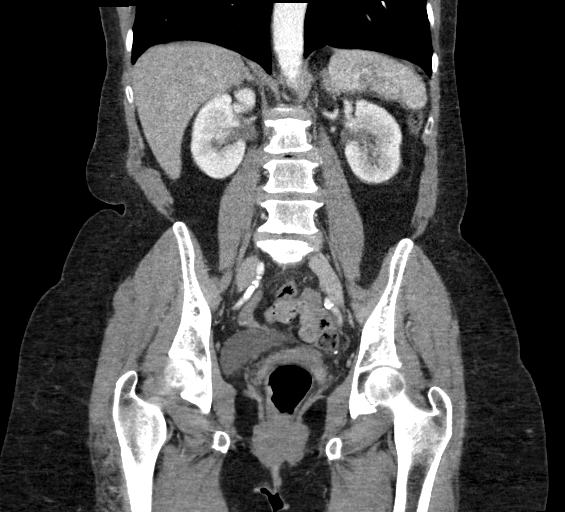

[16 of 46 positions shown; findings below may reference images not displayed]

FINDINGS: Lower chest: No acute abnormality.

Hepatobiliary: Stable hepatic cysts. No new focal liver abnormality.
The gallbladder is unremarkable. No biliary dilatation.

Pancreas: Unremarkable. Mild prominence of the proximal main
pancreatic duct. No surrounding inflammatory changes.

Spleen: Normal in size without focal abnormality.

Adrenals/Urinary Tract: The adrenal glands are unremarkable.
Low-density lesion in the left kidney is too small to characterize.
No renal or ureteral calculi. No hydronephrosis. The bladder is
unremarkable.

Stomach/Bowel: Unchanged small hiatal hernia. The stomach is
otherwise within normal limits. Appendix appears normal. No evidence
of bowel wall thickening, distention, or surrounding inflammatory
changes.

Vascular/Lymphatic: Aortic atherosclerosis. No enlarged abdominal or
pelvic lymph nodes.

Reproductive: Uterus and bilateral adnexa are unremarkable.

Other: Trace free fluid in the pelvis, nonspecific. No
pneumoperitoneum.

Musculoskeletal: No acute or significant osseous findings. Stable
degenerative changes of the lumbar spine.
IMPRESSION: 1.  No acute intra-abdominal process.
2.  Aortic atherosclerosis ([id number]).

## 2019-05-19 ENCOUNTER — Emergency Department (HOSPITAL_COMMUNITY)
Admission: EM | Admit: 2019-05-19 | Discharge: 2019-05-19 | Disposition: A | Discharge status: Home / Self Care | Payer: Medicare Other | Attending: Emergency Medicine | Admitting: Emergency Medicine

## 2019-05-19 ENCOUNTER — Encounter (HOSPITAL_COMMUNITY): Payer: Self-pay

## 2019-05-19 DIAGNOSIS — Z5321 Procedure and treatment not carried out due to patient leaving prior to being seen by health care provider: Secondary | ICD-10-CM | POA: Diagnosis not present

## 2019-05-19 DIAGNOSIS — R05 Cough: Secondary | ICD-10-CM | POA: Diagnosis not present

## 2019-05-19 NOTE — ED Notes (Signed)
No answer for room x2 

## 2019-05-19 NOTE — ED Triage Notes (Signed)
Pt presents to ED with sinus pressure, congestion and cough and fatigue x3 days.  Pt denies fever.  Pt states unable to take OTC medications due to her "heart problems."

## 2019-05-20 ENCOUNTER — Other Ambulatory Visit: Payer: Self-pay

## 2019-05-20 ENCOUNTER — Emergency Department (HOSPITAL_COMMUNITY): Payer: Medicare Other

## 2019-05-20 ENCOUNTER — Emergency Department (HOSPITAL_COMMUNITY)
Admission: EM | Admit: 2019-05-20 | Discharge: 2019-05-20 | Disposition: A | Discharge status: Home / Self Care | Payer: Medicare Other | Attending: Emergency Medicine | Admitting: Emergency Medicine

## 2019-05-20 ENCOUNTER — Encounter (HOSPITAL_COMMUNITY): Payer: Self-pay

## 2019-05-20 DIAGNOSIS — Z7901 Long term (current) use of anticoagulants: Secondary | ICD-10-CM | POA: Diagnosis not present

## 2019-05-20 DIAGNOSIS — R51 Headache: Secondary | ICD-10-CM | POA: Insufficient documentation

## 2019-05-20 DIAGNOSIS — J45909 Unspecified asthma, uncomplicated: Secondary | ICD-10-CM | POA: Insufficient documentation

## 2019-05-20 DIAGNOSIS — Z79899 Other long term (current) drug therapy: Secondary | ICD-10-CM | POA: Insufficient documentation

## 2019-05-20 DIAGNOSIS — I10 Essential (primary) hypertension: Secondary | ICD-10-CM | POA: Diagnosis not present

## 2019-05-20 DIAGNOSIS — Z20828 Contact with and (suspected) exposure to other viral communicable diseases: Secondary | ICD-10-CM | POA: Insufficient documentation

## 2019-05-20 DIAGNOSIS — R0789 Other chest pain: Secondary | ICD-10-CM | POA: Diagnosis not present

## 2019-05-20 DIAGNOSIS — Z87891 Personal history of nicotine dependence: Secondary | ICD-10-CM | POA: Diagnosis not present

## 2019-05-20 DIAGNOSIS — R519 Headache, unspecified: Secondary | ICD-10-CM

## 2019-05-20 DIAGNOSIS — R079 Chest pain, unspecified: Secondary | ICD-10-CM | POA: Diagnosis not present

## 2019-05-20 LAB — CBC
HCT: 40.5 % (ref 36.0–46.0)
Hemoglobin: 13.1 g/dL (ref 12.0–15.0)
MCH: 26.8 pg (ref 26.0–34.0)
MCHC: 32.3 g/dL (ref 30.0–36.0)
MCV: 83 fL (ref 80.0–100.0)
Platelets: 216 10*3/uL (ref 150–400)
RBC: 4.88 MIL/uL (ref 3.87–5.11)
RDW: 16.3 % — ABNORMAL HIGH (ref 11.5–15.5)
WBC: 3.6 10*3/uL — ABNORMAL LOW (ref 4.0–10.5)
nRBC: 0 % (ref 0.0–0.2)

## 2019-05-20 LAB — BASIC METABOLIC PANEL
Anion gap: 7 (ref 5–15)
BUN: 8 mg/dL (ref 8–23)
CO2: 25 mmol/L (ref 22–32)
Calcium: 9.6 mg/dL (ref 8.9–10.3)
Chloride: 107 mmol/L (ref 98–111)
Creatinine, Ser: 0.69 mg/dL (ref 0.44–1.00)
GFR calc Af Amer: >60 mL/min (ref >60)
GFR calc non Af Amer: >60 mL/min (ref >60)
Glucose, Bld: 112 mg/dL — ABNORMAL HIGH (ref 70–99)
Potassium: 3.5 mmol/L (ref 3.5–5.1)
Sodium: 139 mmol/L (ref 135–145)

## 2019-05-20 LAB — SARS CORONAVIRUS 2 (TAT 6-24 HRS): SARS Coronavirus 2: NEGATIVE

## 2019-05-20 LAB — LIPASE, BLOOD: Lipase: 22 U/L (ref 11–51)

## 2019-05-20 LAB — TROPONIN I (HIGH SENSITIVITY)
Troponin I (High Sensitivity): 4 ng/L (ref <18)
Troponin I (High Sensitivity): 5 ng/L (ref <18)

## 2019-05-20 MED ORDER — NITROGLYCERIN 0.4 MG SL SUBL: 0.4000 mg | SUBLINGUAL_TABLET | SUBLINGUAL | Status: DC | PRN

## 2019-05-20 MED ORDER — ACETAMINOPHEN 500 MG PO TABS: 1000.0000 mg | ORAL_TABLET | Freq: Three times a day (TID) | ORAL | 0 refills | Status: DC | PRN

## 2019-05-20 MED ORDER — FAMOTIDINE 20 MG PO TABS
40.0000 mg | ORAL_TABLET | Freq: Once | ORAL | Status: AC
  Administered 2019-05-20: 40 mg via ORAL
  Filled 2019-05-20: qty 2

## 2019-05-20 MED ORDER — ACETAMINOPHEN 500 MG PO TABS
1000.0000 mg | ORAL_TABLET | Freq: Once | ORAL | Status: AC
  Administered 2019-05-20: 1000 mg via ORAL
  Filled 2019-05-20: qty 2

## 2019-05-20 MED ORDER — ONDANSETRON 4 MG PO TBDP: 4.0000 mg | ORAL_TABLET | ORAL | 0 refills | Status: DC | PRN

## 2019-05-20 MED ORDER — FAMOTIDINE 20 MG PO TABS: 20.0000 mg | ORAL_TABLET | Freq: Two times a day (BID) | ORAL | 0 refills | Status: DC

## 2019-05-20 MED ORDER — SODIUM CHLORIDE 0.9% FLUSH: 3.0000 mL | Freq: Once | INTRAVENOUS | Status: DC

## 2019-05-20 MED ORDER — ONDANSETRON HCL 4 MG PO TABS
4.0000 mg | ORAL_TABLET | Freq: Once | ORAL | Status: AC
  Administered 2019-05-20: 12:00:00 4 mg via ORAL
  Filled 2019-05-20: qty 1

## 2019-05-20 NOTE — ED Triage Notes (Signed)
Patient came yesterday but left prior to being seen. Complains of ongoing congestion, headache, abd. Pain with reported dark stools and cough

## 2019-05-20 NOTE — ED Provider Notes (Signed)
[facility] EMERGENCY DEPARTMENT Provider Note   CSN: [id number] Arrival date & time: 05/20/19  [id number]     History   Chief Complaint No chief complaint on file.   HPI [name] is a 68 y.o. female.     HPI Patient reports that she has had a frontal headache for about 4 days.  She indicates the area right behind her glabella, behind the bridge of her nose and behind her eyes.  She reports is aching in quality.  She thinks it is a "sinus headache".  She has not had significant nasal drainage or discharge.  No visual changes.  No photosensitivity.  No sore throat.  However, she reports about a week or more ago she had a slight sore throat but it resolved spontaneously.  She reports that she has tried cetrizine in the past and nasal sprays which she does not like and  which she does not feel is helpful.  No fever.  She reports she is also developed a little bit of cough.  No sputum production.  This is been dry.  She reports that she took some of her inhalers and she does not feel short of breath but she still feels a slight tightness up in her upper chest.  Patient is also concerned because she has been taking Linzess and feels like now she is having loose bowel movements.  She is also been taking some prune juice but she is stopped taking that.  No vomiting.  No pain burning urgency with urination.  (After patient been in the emergency department for several hours she then noted that she was going to the bathroom about every 30 minutes a couple of times). Past Medical History:  Diagnosis Date  . A-fib ([place])   . Allergy    seasonal  . Asthma   . Chronic anticoagulation   . Eczema   . GERD (gastroesophageal reflux disease)   . History of kidney stones   . Hypertension   . PAF (paroxysmal atrial fibrillation) ([place]) 11/26/2015   April/May 2017: Multiple ED visits for A. Fib.  02/12/16: Improved on diltiazem IV. Cardiology consulted in the ED and discharge patient with  diltiazem XR 120 mg once daily with 60 mg by mouth every 6 hours as needed for symptomatic palpitations, flecainide 50 mg twice daily. Referred to the atrial fibrillation clinic.  02/17/16: Seen in the atrial fibrillation clinic by NP Roderic Palau. Normal sin    Patient Active Problem List   Diagnosis Date Noted  . Gallstones 12/08/2018  . Choledocholithiasis   . Vaginal discharge 10/11/2018  . Vaginal bleeding 10/11/2018  . History of radiofrequency ablation (RFA) procedure for cardiac arrhythmia   . Asthma exacerbation 08/28/2018  . Chronic congestion of paranasal sinus 06/14/2018  . Primary osteoarthritis involving multiple joints 06/14/2018  . Acute pain of left knee 05/15/2018  . Hyperpigmented skin lesion 09/09/2017  . Osteoporosis without current pathological fracture 04/15/2017  . Normocytic anemia 10/19/2016  . Hyperlipidemia 03/18/2016  . Special screening for malignant neoplasms, colon 03/17/2016  . Cervical cancer screening 03/17/2016  . Assistance with transportation 03/17/2016  . HTN (hypertension), benign   . GERD (gastroesophageal reflux disease)   . PAF (paroxysmal atrial fibrillation) ([place]) 11/26/2015  . Chronic anticoagulation 11/26/2015  . Marijuana abuse 06/11/2015  . Hypokalemia 01/07/2012    Past Surgical History:  Procedure Laterality Date  . ATRIAL FIBRILLATION ABLATION N/A 09/30/2017   Procedure: ATRIAL FIBRILLATION ABLATION;  Surgeon: Thompson Grayer, MD;  Location: [facility];  Service: Cardiovascular;  Laterality: N/A;  . CHOLECYSTECTOMY N/A 12/08/2018   Procedure: LAPAROSCOPIC CHOLECYSTECTOMY WITH INTRAOPERATIVE CHOLANGIOGRAM;  Surgeon: Jovita Kussmaul, MD;  Location: [facility];  Service: General;  Laterality: N/A;  . ERCP N/A 12/09/2018   Procedure: ENDOSCOPIC RETROGRADE CHOLANGIOPANCREATOGRAPHY (ERCP);  Surgeon: Carol Ada, MD;  Location: [facility];  Service: Endoscopy;  Laterality: N/A;  . SPHINCTEROTOMY  12/09/2018   Procedure: SPHINCTEROTOMY;   Surgeon: Carol Ada, MD;  Location: [facility];  Service: Endoscopy;;  . TONSILLECTOMY    . TUBAL LIGATION       OB History   No obstetric history on file.      Home Medications    Prior to Admission medications   Medication Sig Start Date End Date Taking? Authorizing Provider  acetaminophen (TYLENOL) 325 MG tablet Take 650 mg by mouth every 6 (six) hours as needed for mild pain.   Yes [provider]  diltiazem (CARDIZEM) 60 MG tablet Take 60 mg by mouth every 6 (six) hours as needed (Rapid AFIB over 100).   Yes [provider]  diltiazem (CARTIA XT) 120 MG 24 hr capsule Take 1 capsule (120 mg total) by mouth 2 (two) times daily. 10/23/18  Yes Sherran Needs, NP  fluticasone (FLOVENT HFA) 44 MCG/ACT inhaler Inhale 1 puff into the lungs daily.    Yes [provider]  levalbuterol (XOPENEX HFA) 45 MCG/ACT inhaler INHALE 1 PUFF INTO LUNGS EVERY 6 HOURS AS NEEDED FOR SHORTNESS OF BREATH Patient taking differently: Inhale 1 puff into the lungs every 6 (six) hours as needed for shortness of breath.  04/04/19  Yes Bloomfield, Carley D, DO  levalbuterol (XOPENEX) 0.63 MG/3ML nebulizer solution Take 3 mLs (0.63 mg total) by nebulization every 2 (two) hours as needed for wheezing or shortness of breath. 04/07/19  Yes Tillie Fantasia, MD  LINZESS 145 MCG CAPS capsule TAKE 1 CAPSULE(145 MCG) BY MOUTH DAILY BEFORE BREAKFAST Patient taking differently: Take 145 mcg by mouth See admin instructions.  02/05/19  Yes [name], MD  losartan (COZAAR) 50 MG tablet TAKE 1 TABLET(50 MG) BY MOUTH DAILY Patient taking differently: Take 50 mg by mouth daily.  01/01/19  Yes [name], MD  magnesium hydroxide (MILK OF MAGNESIA) 400 MG/5ML suspension Take 15-30 mLs by mouth at bedtime as needed for mild constipation. 11/01/17  Yes Harris, Abigail, PA-C  montelukast (SINGULAIR) 10 MG tablet TAKE 1 TABLET(10 MG) BY MOUTH AT BEDTIME Patient taking differently: Take 10 mg by mouth  daily as needed (asthma).  01/12/19  Yes [name], MD  potassium chloride SA (K-DUR) 10 MEQ tablet Take 2 tablets (20 mEq total) by mouth 2 (two) times daily. 04/04/19 05/20/19 Yes Bloomfield, Carley D, DO  rivaroxaban (XARELTO) 20 MG TABS tablet Take 1 tablet (20 mg total) by mouth daily with supper. 01/01/19  Yes Sherran Needs, NP  tiotropium (SPIRIVA) 18 MCG inhalation capsule Place 1 capsule (18 mcg total) into inhaler and inhale daily. 01/01/19  Yes [name], MD  triamcinolone cream (KENALOG) 0.1 % Apply 1 application topically 2 (two) times daily. 04/04/19  Yes Bloomfield, Carley D, DO  acetaminophen (TYLENOL) 500 MG tablet Take 2 tablets (1,000 mg total) by mouth every 8 (eight) hours as needed. 05/20/19   Charlesetta Shanks, MD  diphenhydrAMINE (BENADRYL) 2 % cream Apply topically 3 (three) times daily as needed for itching. Patient not taking: Reported on 05/20/2019 04/04/19   Modena Nunnery D, DO  famotidine (PEPCID) 20  MG tablet Take 1 tablet (20 mg total) by mouth 2 (two) times daily. 05/20/19   Charlesetta Shanks, MD  HYDROcodone-acetaminophen (NORCO/VICODIN) 5-325 MG tablet Take 1 tablet by mouth every 6 (six) hours as needed. Patient not taking: Reported on 05/20/2019 12/10/18   Johnathan Hausen, MD  hydrOXYzine (ATARAX/VISTARIL) 25 MG tablet Take 1 tablet (25 mg total) by mouth every 6 (six) hours. Patient not taking: Reported on 05/20/2019 03/02/19   Kinnie Feil, PA-C  ondansetron (ZOFRAN ODT) 4 MG disintegrating tablet Take 1 tablet (4 mg total) by mouth every 8 (eight) hours as needed for nausea or vomiting. Patient not taking: Reported on 05/20/2019 11/21/18   Duffy Bruce, MD  ondansetron (ZOFRAN ODT) 4 MG disintegrating tablet Take 1 tablet (4 mg total) by mouth every 4 (four) hours as needed for nausea or vomiting. 05/20/19   Charlesetta Shanks, MD  sorbitol 70 % solution Take 15 mLs by mouth daily as needed. Patient not taking: Reported on 05/20/2019 04/04/19   Modena Nunnery D, DO  sucralfate (CARAFATE) 1 g tablet Take 1 tablet (1 g total) by mouth 4 (four) times daily. Patient not taking: Reported on 05/20/2019 10/16/18 10/16/19  Oval Linsey, MD    Family History Family History  Problem Relation Age of Onset  . Heart attack Mother        [name] age  . Breast cancer Sister        Late 33s; now s/p mastectomy   . Lung cancer Brother        x 2  . Cancer Sister   . Cancer Sister   . Cancer Sister   . Colon cancer Neg Hx   . Colon polyps Neg Hx   . Esophageal cancer Neg Hx   . Rectal cancer Neg Hx   . Stomach cancer Neg Hx     Social History Social History   Tobacco Use  . Smoking status: Former Smoker    Quit date: 10/24/2015    Years since quitting: 3.5  . Smokeless tobacco: Never Used  Substance Use Topics  . Alcohol use: Yes  . Drug use: Not Currently    Types: Marijuana    Comment: Smoked marijuana in the past.     Allergies   Albuterol, Percocet [oxycodone-acetaminophen], Celecoxib, and Protonix [pantoprazole sodium]   Review of Systems Review of Systems 10 Systems reviewed and are negative for acute change except as noted in the HPI.   Physical Exam Updated Vital Signs BP (!) 146/94   Pulse 71   Temp 98.7 F (37.1 C) (Oral)   Resp (!) 21   SpO2 98%   Physical Exam Constitutional:      Appearance: Normal appearance. She is well-developed.  HENT:     Head: Normocephalic and atraumatic.     Nose: Nose normal.     Mouth/Throat:     Mouth: Mucous membranes are moist.     Pharynx: Oropharynx is clear.  Eyes:     Extraocular Movements: Extraocular movements intact.     Pupils: Pupils are equal, round, and reactive to light.  Neck:     Musculoskeletal: Neck supple.  Cardiovascular:     Rate and Rhythm: Normal rate and regular rhythm.     Heart sounds: Normal heart sounds.  Pulmonary:     Effort: Pulmonary effort is normal.     Breath sounds: Normal breath sounds.  Abdominal:     General: Bowel sounds are  normal. There is no distension.  Palpations: Abdomen is soft.     Tenderness: There is no abdominal tenderness.  Musculoskeletal: Normal range of motion.     Right lower leg: No edema.     Left lower leg: No edema.  Skin:    General: Skin is warm and dry.  Neurological:     Mental Status: She is alert and oriented to person, place, and time.     GCS: GCS eye subscore is 4. GCS verbal subscore is 5. GCS motor subscore is 6.     Coordination: Coordination normal.      ED Treatments / Results  Labs (all labs ordered are listed, but only abnormal results are displayed) Labs Reviewed  BASIC METABOLIC PANEL - Abnormal; Notable for the following components:      Result Value   Glucose, Bld 112 (*)    All other components within normal limits  CBC - Abnormal; Notable for the following components:   WBC 3.6 (*)    RDW 16.3 (*)    All other components within normal limits  SARS CORONAVIRUS 2 (TAT 6-24 HRS)  LIPASE, BLOOD  TROPONIN I (HIGH SENSITIVITY)  TROPONIN I (HIGH SENSITIVITY)    EKG EKG Interpretation  Date/Time:  Sunday May 20 2019 09:25:43 EDT Ventricular Rate:  79 PR Interval:  152 QRS Duration: 84 QT Interval:  388 QTC Calculation: 444 R Axis:   60 Text Interpretation:  Normal sinus rhythm with sinus arrhythmia Normal ECG normal Confirmed by Charlesetta Shanks [phone]) on 05/20/2019 11:10:21 AM   Radiology Dg Chest 2 View  Result Date: 05/20/2019 CLINICAL DATA:  Chest pain. EXAM: CHEST - 2 VIEW COMPARISON:  Radiograph of April 07, 2019. FINDINGS: The heart size and mediastinal contours are within normal limits. Both lungs are clear. No pneumothorax or pleural effusion is noted. The visualized skeletal structures are unremarkable. IMPRESSION: No active cardiopulmonary disease. Electronically Signed   By: Marijo Conception M.D.   On: 05/20/2019 10:00   Ct Head Wo Contrast  Result Date: 05/20/2019 CLINICAL DATA:  Headache. EXAM: CT HEAD WITHOUT CONTRAST  TECHNIQUE: Contiguous axial images were obtained from the base of the skull through the vertex without intravenous contrast. COMPARISON:  CT scan of April 22, 2016. FINDINGS: Brain: No evidence of acute infarction, hemorrhage, hydrocephalus, extra-axial collection or mass lesion/mass effect. Vascular: No hyperdense vessel or unexpected calcification. Skull: Normal. Negative for fracture or focal lesion. Sinuses/Orbits: No acute finding. Other: None. IMPRESSION: Normal head CT. Electronically Signed   By: Marijo Conception M.D.   On: 05/20/2019 13:18    Procedures Procedures (including critical care time)  Medications Ordered in ED Medications  sodium chloride flush (NS) 0.9 % injection 3 mL (3 mLs Intravenous Not Given 05/20/19 1002)  nitroGLYCERIN (NITROSTAT) SL tablet 0.4 mg (has no administration in time range)  ondansetron (ZOFRAN) tablet 4 mg (4 mg Oral Given 05/20/19 1136)  acetaminophen (TYLENOL) tablet 1,000 mg (1,000 mg Oral Given 05/20/19 1136)  famotidine (PEPCID) tablet 40 mg (40 mg Oral Given 05/20/19 1136)     Initial Impression / Assessment and Plan / ED Course  I have reviewed the triage vital signs and the nursing notes.  Pertinent labs & imaging results that were available during my care of the patient were reviewed by me and considered in my medical decision making (see chart for details).        Patient is clinically well in appearance.  She does report headache which is very much concentrated in the sinus region.  She only is experiencing pain behind the glabella and the nose and the eyes.  CT however does not identify any sinus fluid.  Patient is also anticoagulated and no evidence of any intracranial bleeding on scan.  She has no associated neurologic symptoms as well in appearance.  Her headache did improve significantly after 2 extra strength Tylenol.  Patient now notes that she habitually only takes 1 Tylenol tablet and feels that the 2 were effective for her.  Patient  also describes some upper chest discomfort.  This is in association with what sounds like URI symptoms although chest x-ray is clear and exam is normal.  2 sets of cardiac enzymes are normal and patient has no change in her troponin.  At this time I have low suspicion for acute ischemic type of chest pain.  Patient has been in observation in the emergency department and well in appearance without symptoms.  At this time will plan for discharge.  She will have close follow-up with her providers this week.  Return precautions reviewed.  COVID test is pending.  Lower suspicion for COVID based on current findings however even with minor symptoms, will opt to test.  [name] was evaluated in Emergency Department on 05/20/2019 for the symptoms described in the history of present illness. She was evaluated in the context of the global COVID-19 pandemic, which necessitated consideration that the patient might be at risk for infection with the SARS-CoV-2 virus that causes COVID-19. Institutional protocols and algorithms that pertain to the evaluation of patients at risk for COVID-19 are in a state of rapid change based on information released by regulatory bodies including the CDC and federal and state organizations. These policies and algorithms were followed during the patient's care in the ED.  Final Clinical Impressions(s) / ED Diagnoses   Final diagnoses:  Frontal headache  Atypical chest pain    ED Discharge Orders         Ordered    famotidine (PEPCID) 20 MG tablet  2 times daily     05/20/19 1426    ondansetron (ZOFRAN ODT) 4 MG disintegrating tablet  Every 4 hours PRN     05/20/19 1426    acetaminophen (TYLENOL) 500 MG tablet  Every 8 hours PRN     05/20/19 1426           Charlesetta Shanks, MD 05/20/19 1436

## 2019-05-20 NOTE — Discharge Instructions (Signed)
1.  You may take 2 extra strength Tylenol tablets every 8 hours if needed for headache or aches and pains. 2.  Start taking Pepcid twice daily for the next 2 weeks.  You may take Zofran every 4-6 hours if needed for nausea. 3.  Make an appointment to see your doctor as soon as possible. 4.  Return to the emergency department if you have any worsening, new or concerning symptoms.

## 2019-05-20 NOTE — ED Notes (Signed)
Patient transported to CT 

## 2019-06-01 IMAGING — CR DG CHEST 2V
2 series · 2 of 2 positions shown · non-contrast
Comparison: 01/25/2018.

CLINICAL DATA: Chest pain and shortness of breath for 2 days.

EXAM:
CHEST - 2 VIEW

[chest pa]
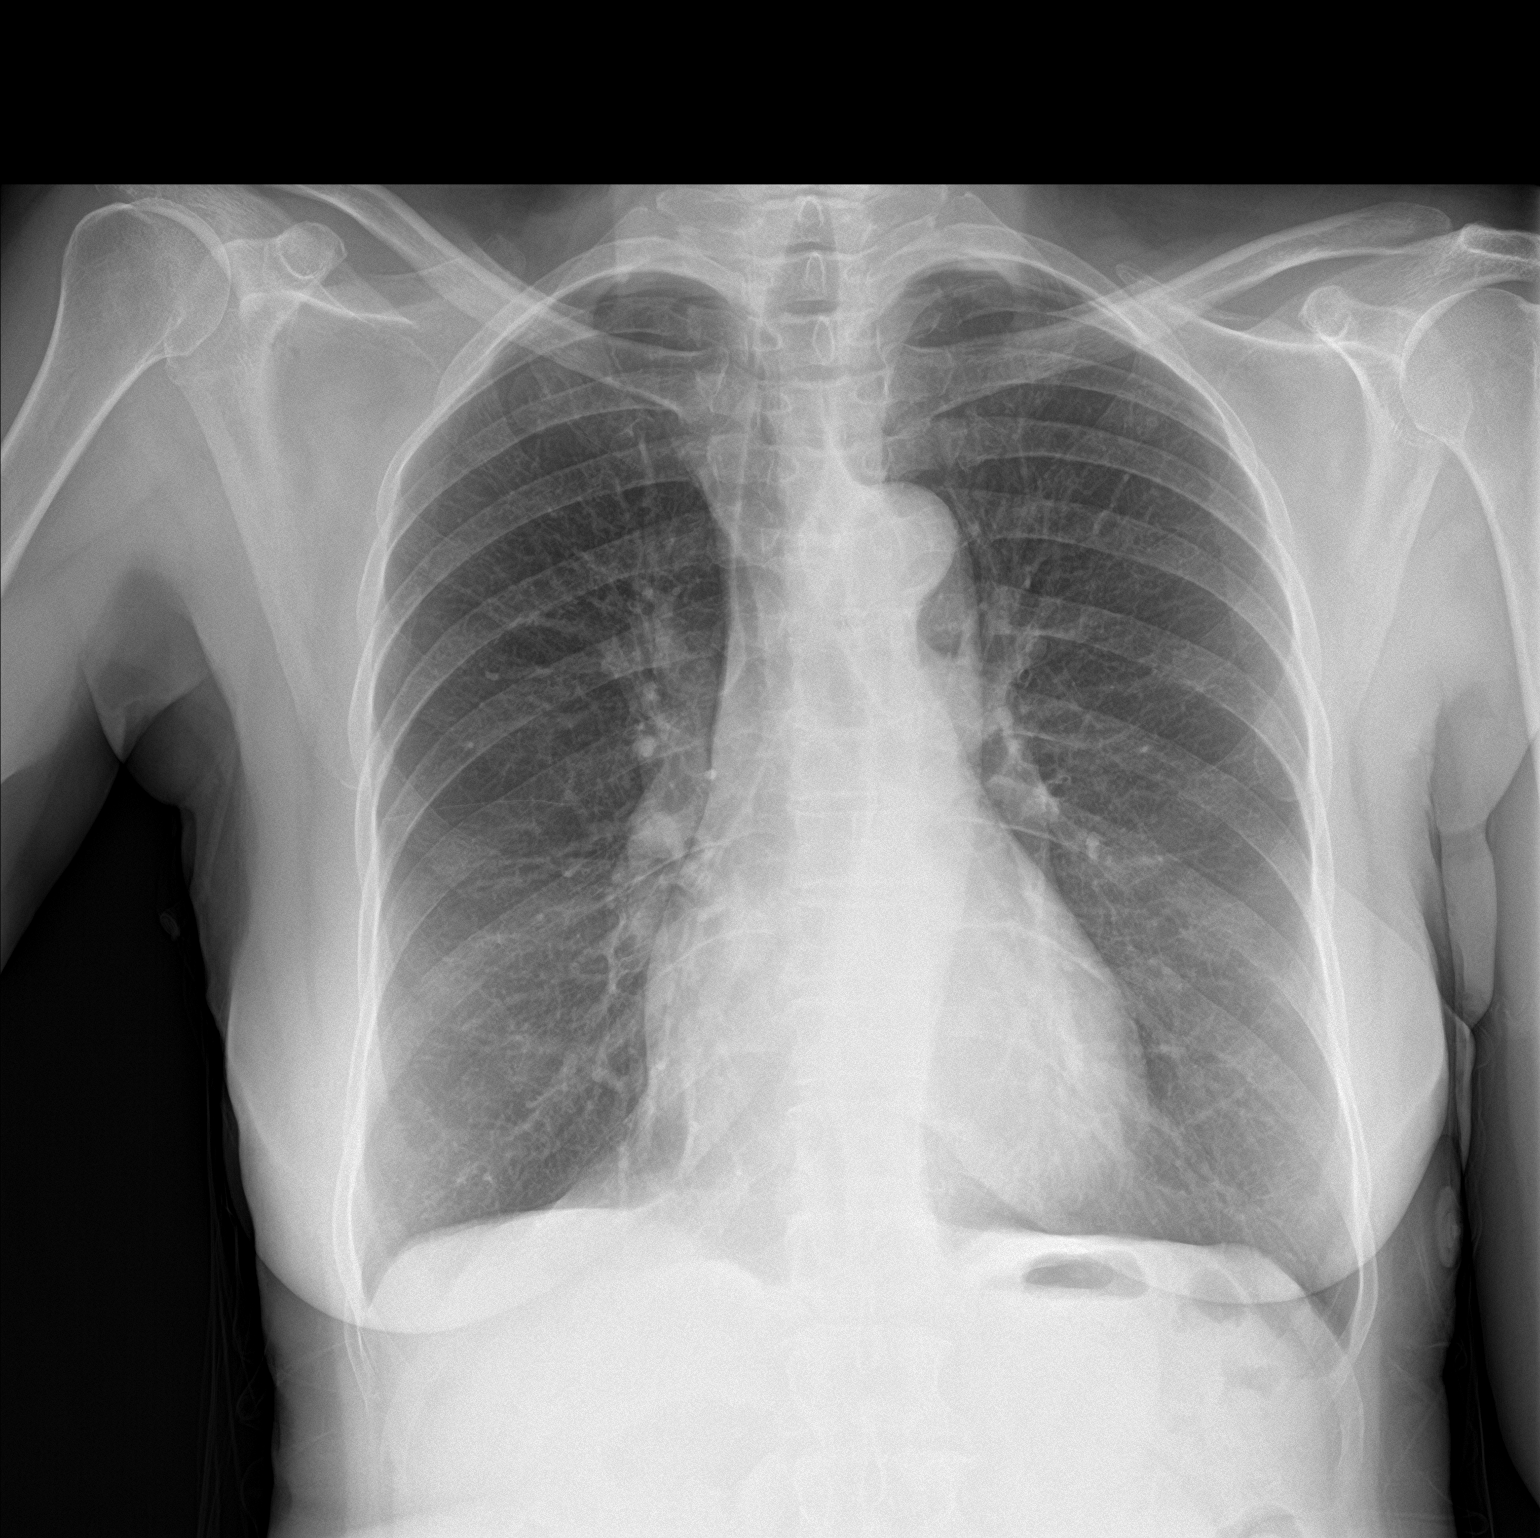

[chest lat]
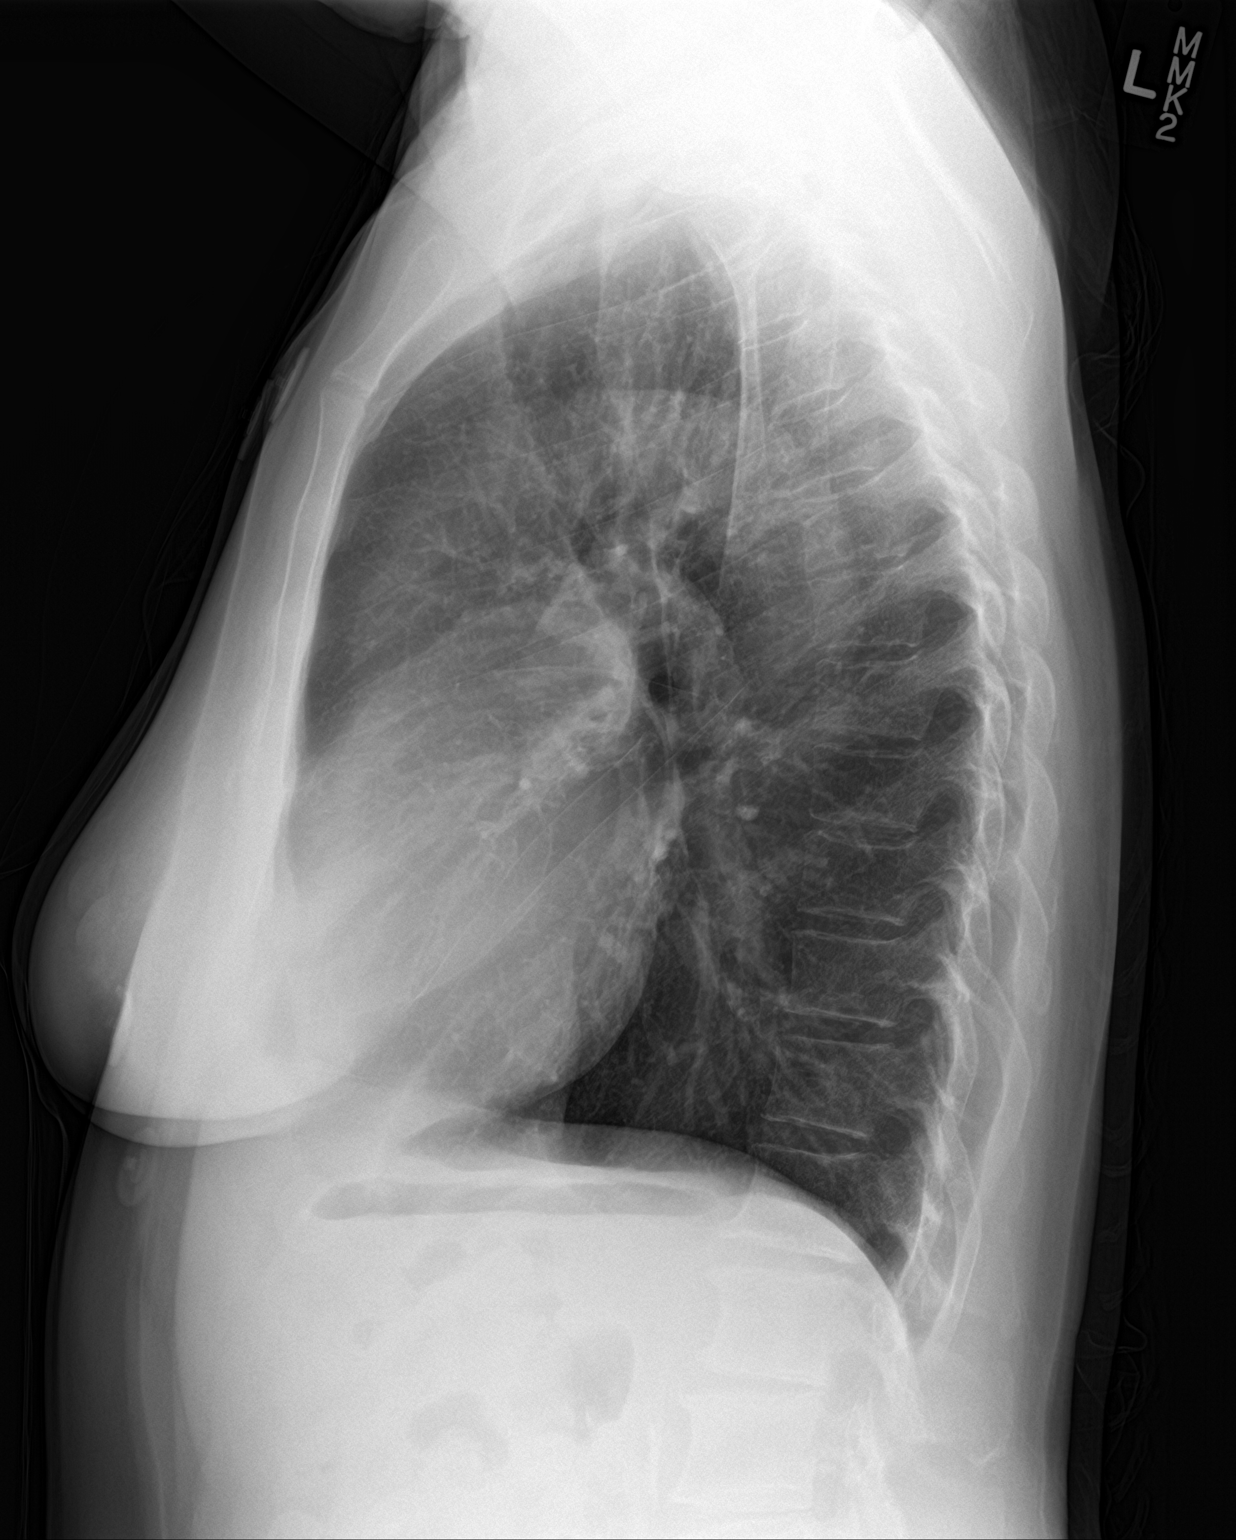

[2 of 2 positions shown; findings below may reference images not displayed]

FINDINGS: The heart size and mediastinal contours are within normal limits.
Both lungs are clear. The visualized skeletal structures are
unremarkable.
IMPRESSION: No active cardiopulmonary disease.  Stable exam from priors.

## 2019-06-02 IMAGING — CT CT ABD-PELV W/ CM
2 of 5 series · 16 of 46 positions shown, 18 images · IV contrast (APPLIED)
Comparison: 01/25/2018.  01/11/2018.

CLINICAL DATA: Epigastric and chest pain. Shortness of breath. Two
days duration.

EXAM:
CT ABDOMEN AND PELVIS WITH CONTRAST
TECHNIQUE: Multidetector CT imaging of the abdomen and pelvis was performed
using the standard protocol following bolus administration of
intravenous contrast.
CONTRAST:  100mL OMNIPAQUE IOHEXOL 300 MG/ML  SOLN

[Series 3: abdomen 5.0 · axial · 0.67mm/px · z∈[+889,+1229]mm · 13 of 78 slices shown, 15 images]
[im 5/78  soft-tissue]
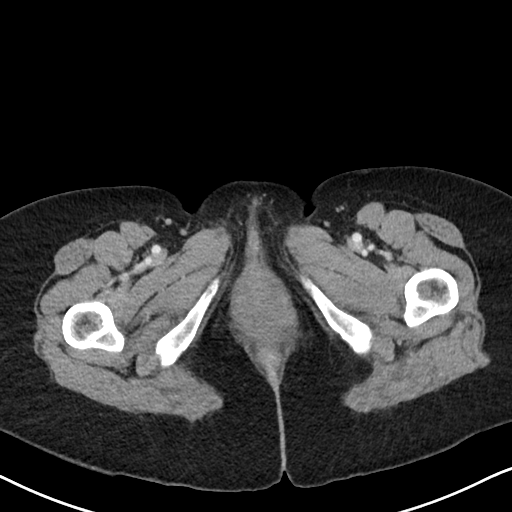
[im 5/78  bone]
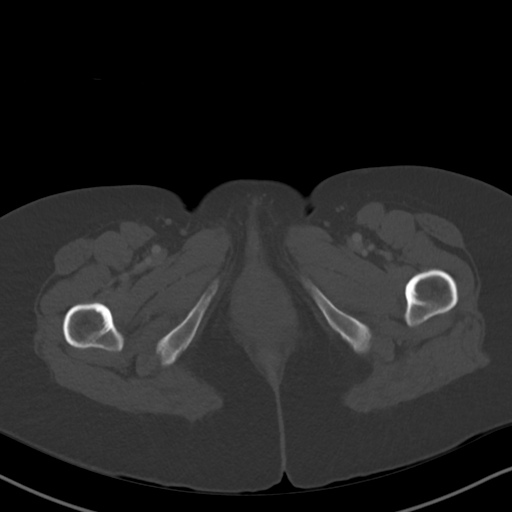
[im 10/78  soft-tissue]
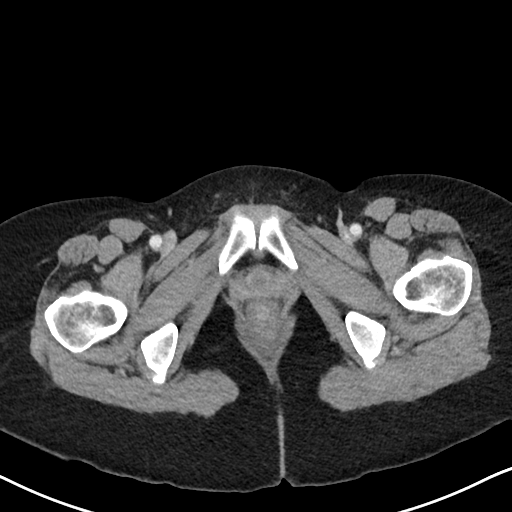
[im 15/78  soft-tissue]
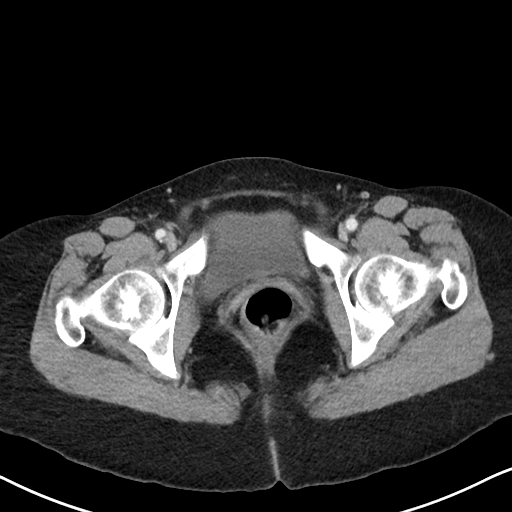
[im 25/78  soft-tissue]
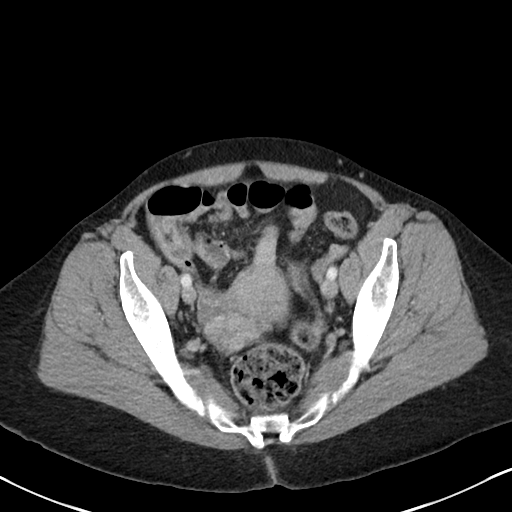
[im 29/78  soft-tissue]
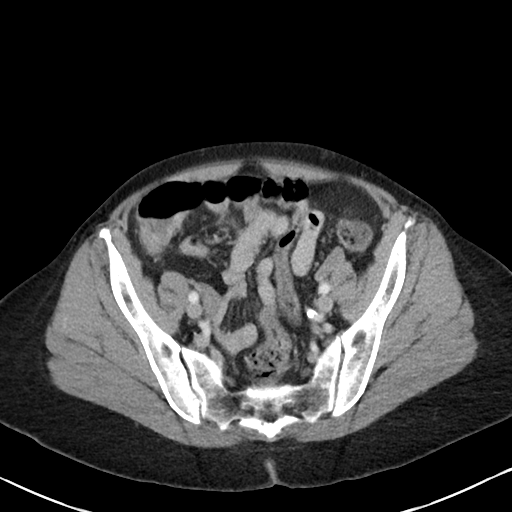
[im 34/78  soft-tissue]
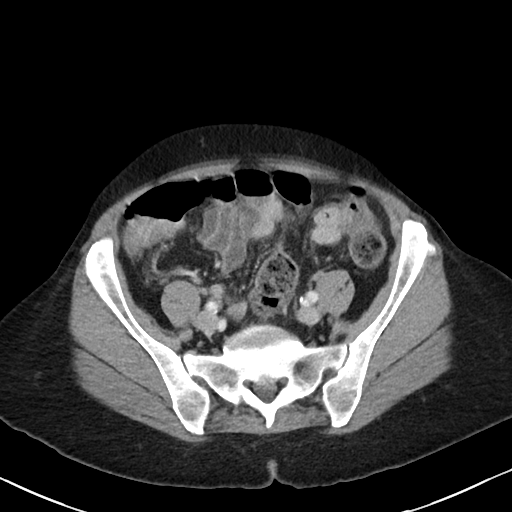
[im 39/78  soft-tissue]
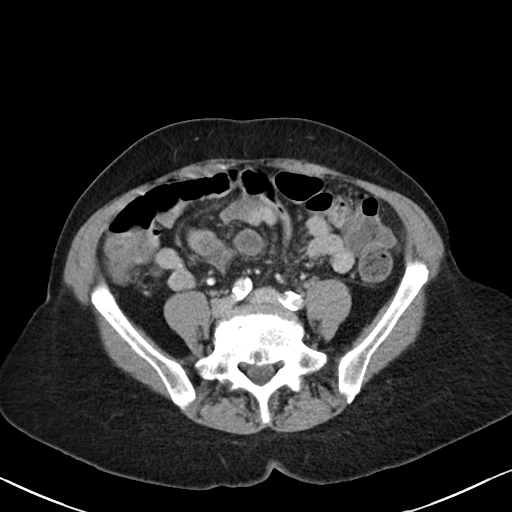
[im 44/78  soft-tissue]
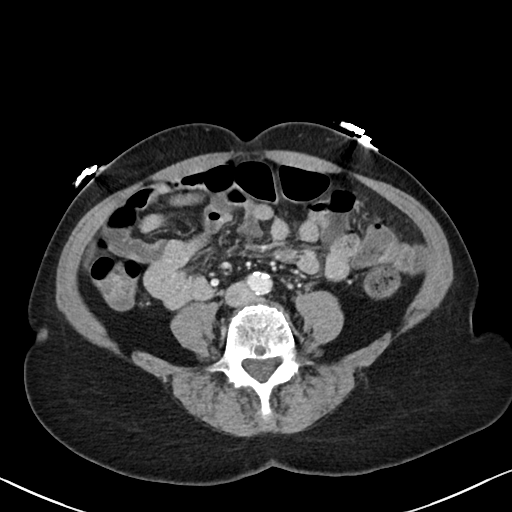
[im 49/78  soft-tissue]
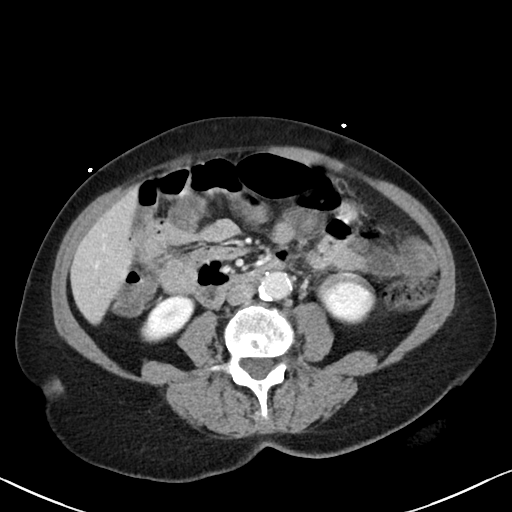
[im 49/78  bone]
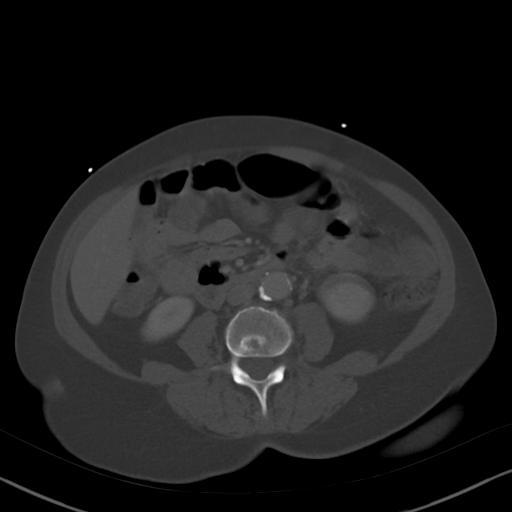
[im 53/78  soft-tissue]
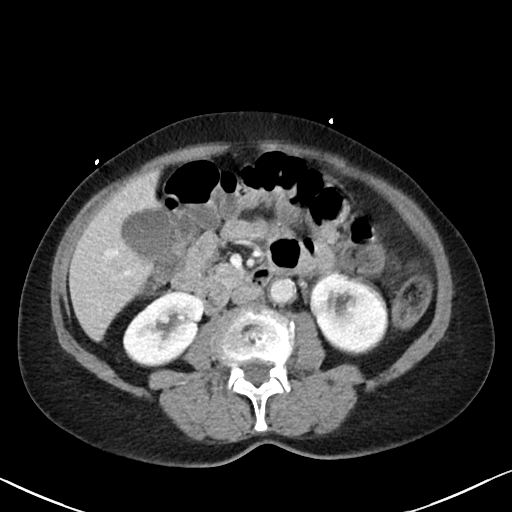
[im 63/78  soft-tissue]
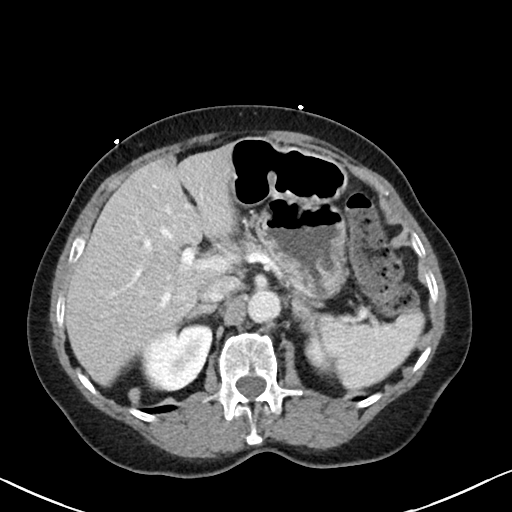
[im 68/78  soft-tissue]
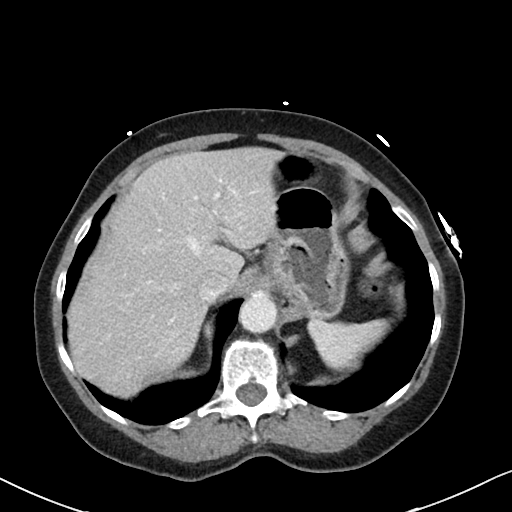
[im 73/78  soft-tissue]
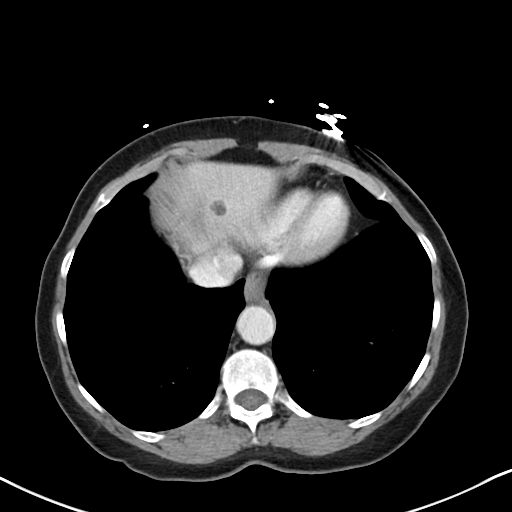

[Series 6: abdomen 3.0 mpr cor · coronal · 0.61mm/px · 3 of 85 slices shown]
[im 29/85  soft-tissue]
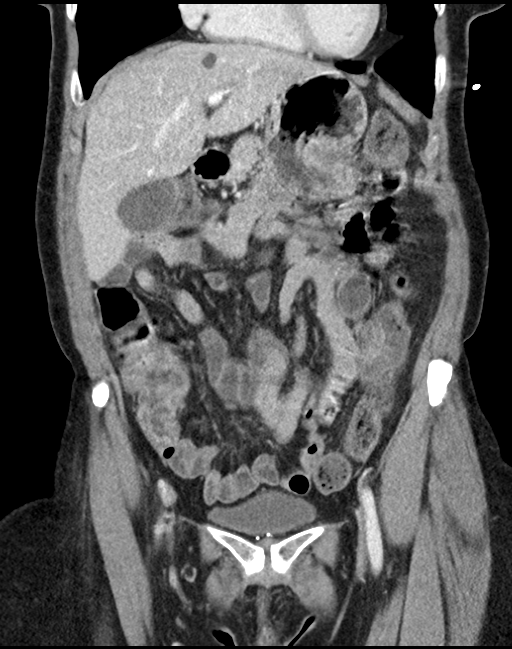
[im 38/85  soft-tissue]
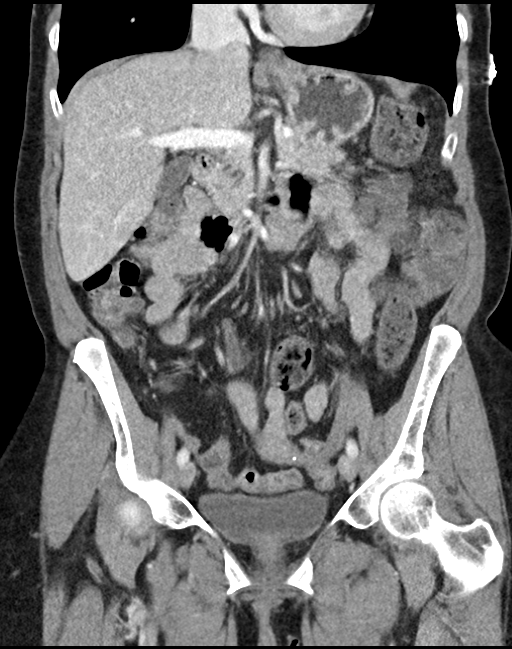
[im 47/85  soft-tissue]
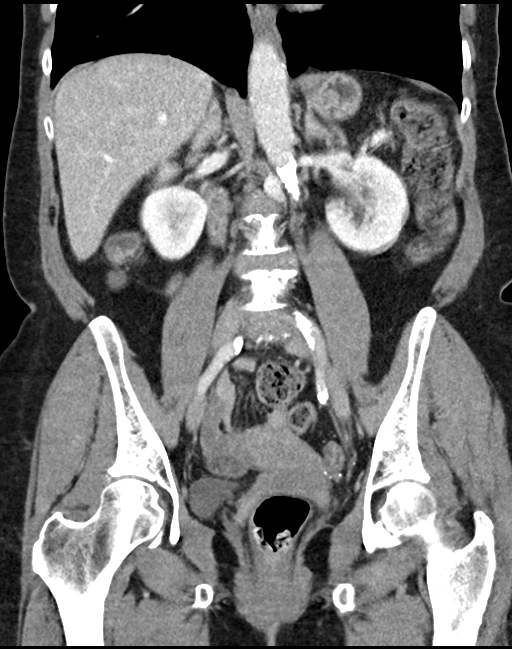

[16 of 46 positions shown; findings below may reference images not displayed]

FINDINGS: Lower chest: Negative.  Small hiatal hernia.

Hepatobiliary: Multiple benign appearing scattered liver cysts. No
acute attic parenchymal finding. No calcified gallstones.

Pancreas: Normal

Spleen: Normal

Adrenals/Urinary Tract: Adrenal glands are normal. Kidneys are
normal. No cyst, mass, stone or hydronephrosis. No stone in the
bladder.

Stomach/Bowel: No sign of bowel obstruction or inflammatory disease.

Vascular/Lymphatic: Aortic atherosclerosis. The aorta is tortuous.
No aneurysm. IVC is normal. No retroperitoneal adenopathy.

Reproductive: Uterus and adnexal regions appear normal for age.

Other: No free fluid or air.

Musculoskeletal: Chronic lumbar degenerative changes.
IMPRESSION: No acute finding by CT.  No cause of acute pain identified.

Aortic Atherosclerosis (AXUR0-110.0).

## 2019-06-02 IMAGING — DX DG CHEST 2V
2 series · 2 of 2 positions shown · non-contrast
Comparison: 02/13/2018

CLINICAL DATA: Chest pain, shortness of breath

EXAM:
CHEST - 2 VIEW

[w chest pa]
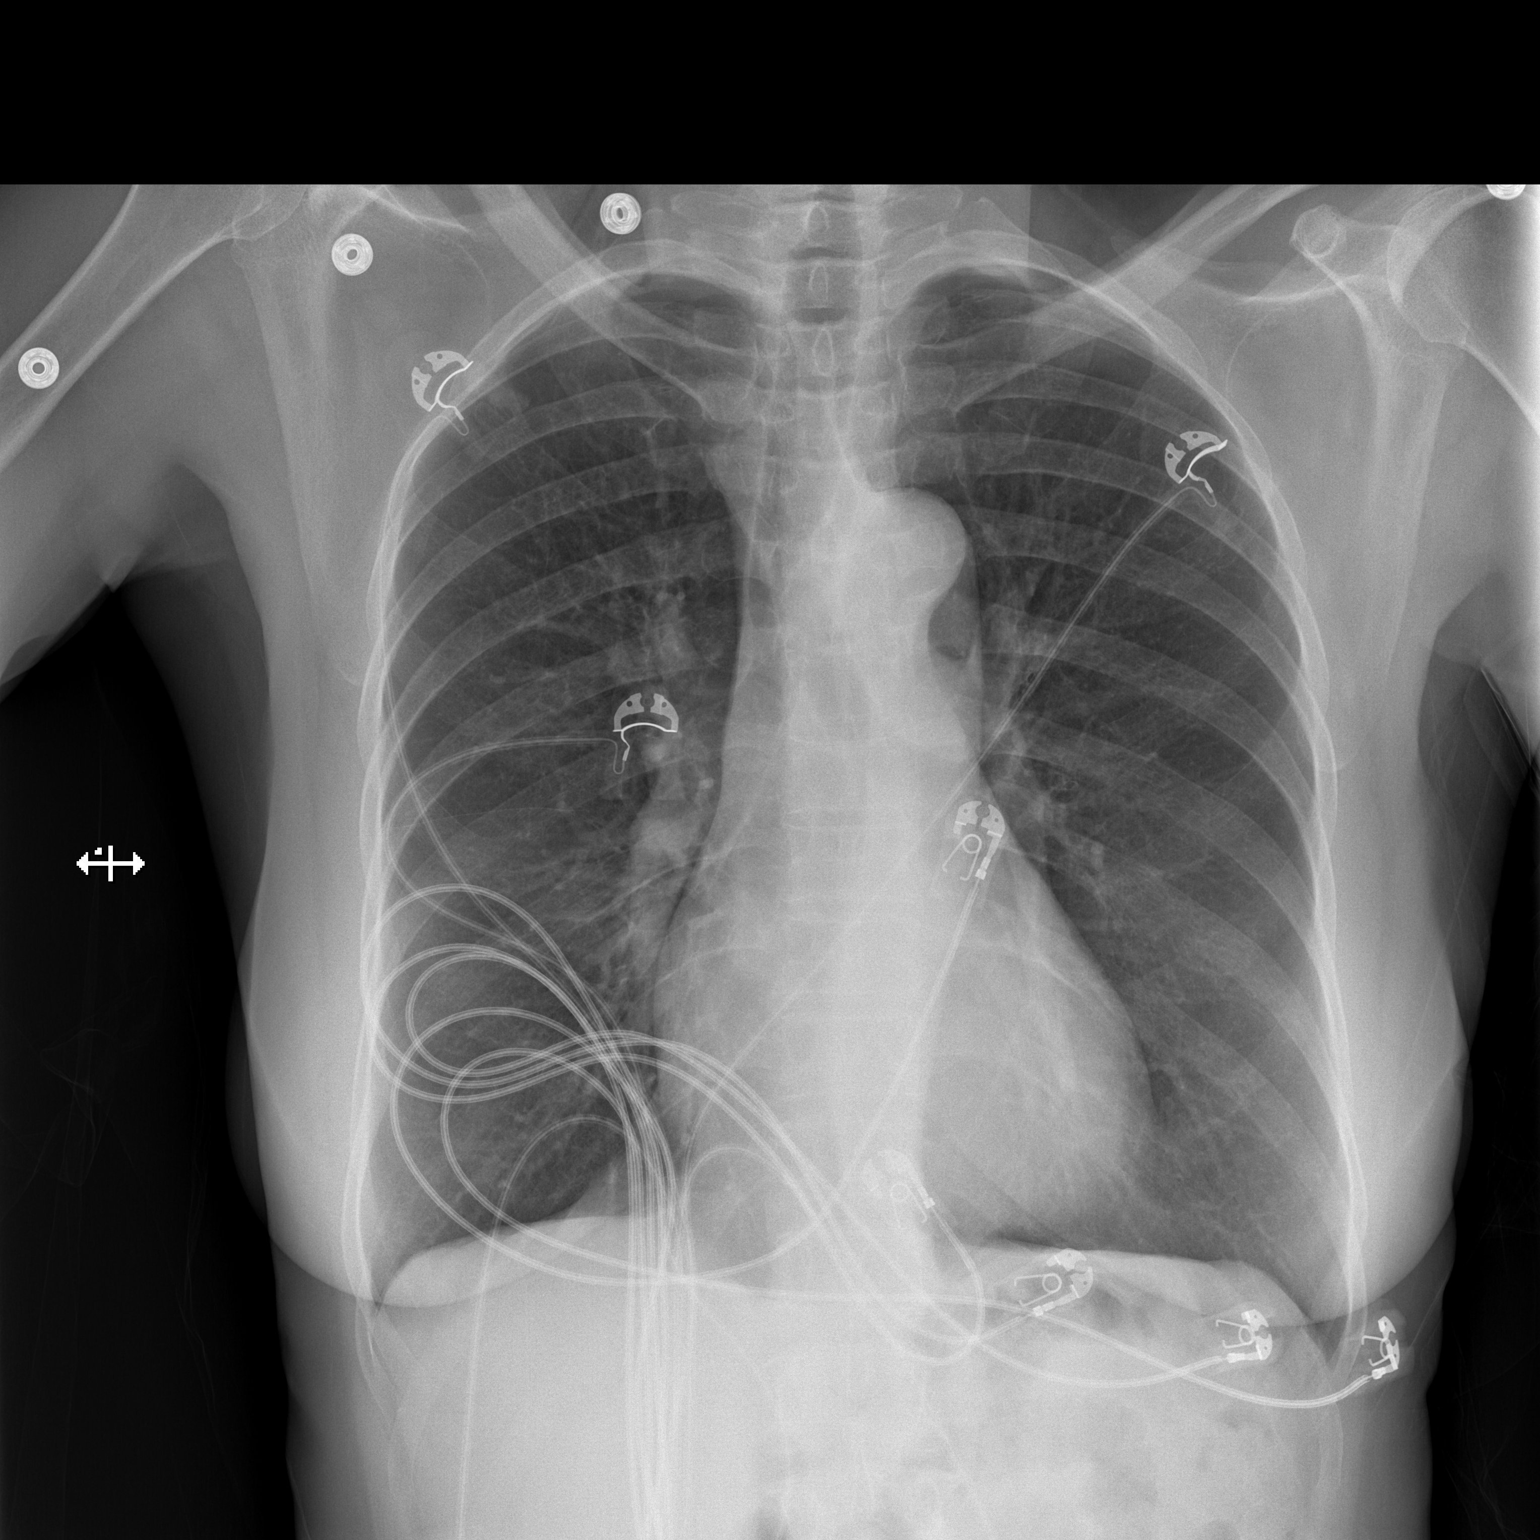

[w chest lat]
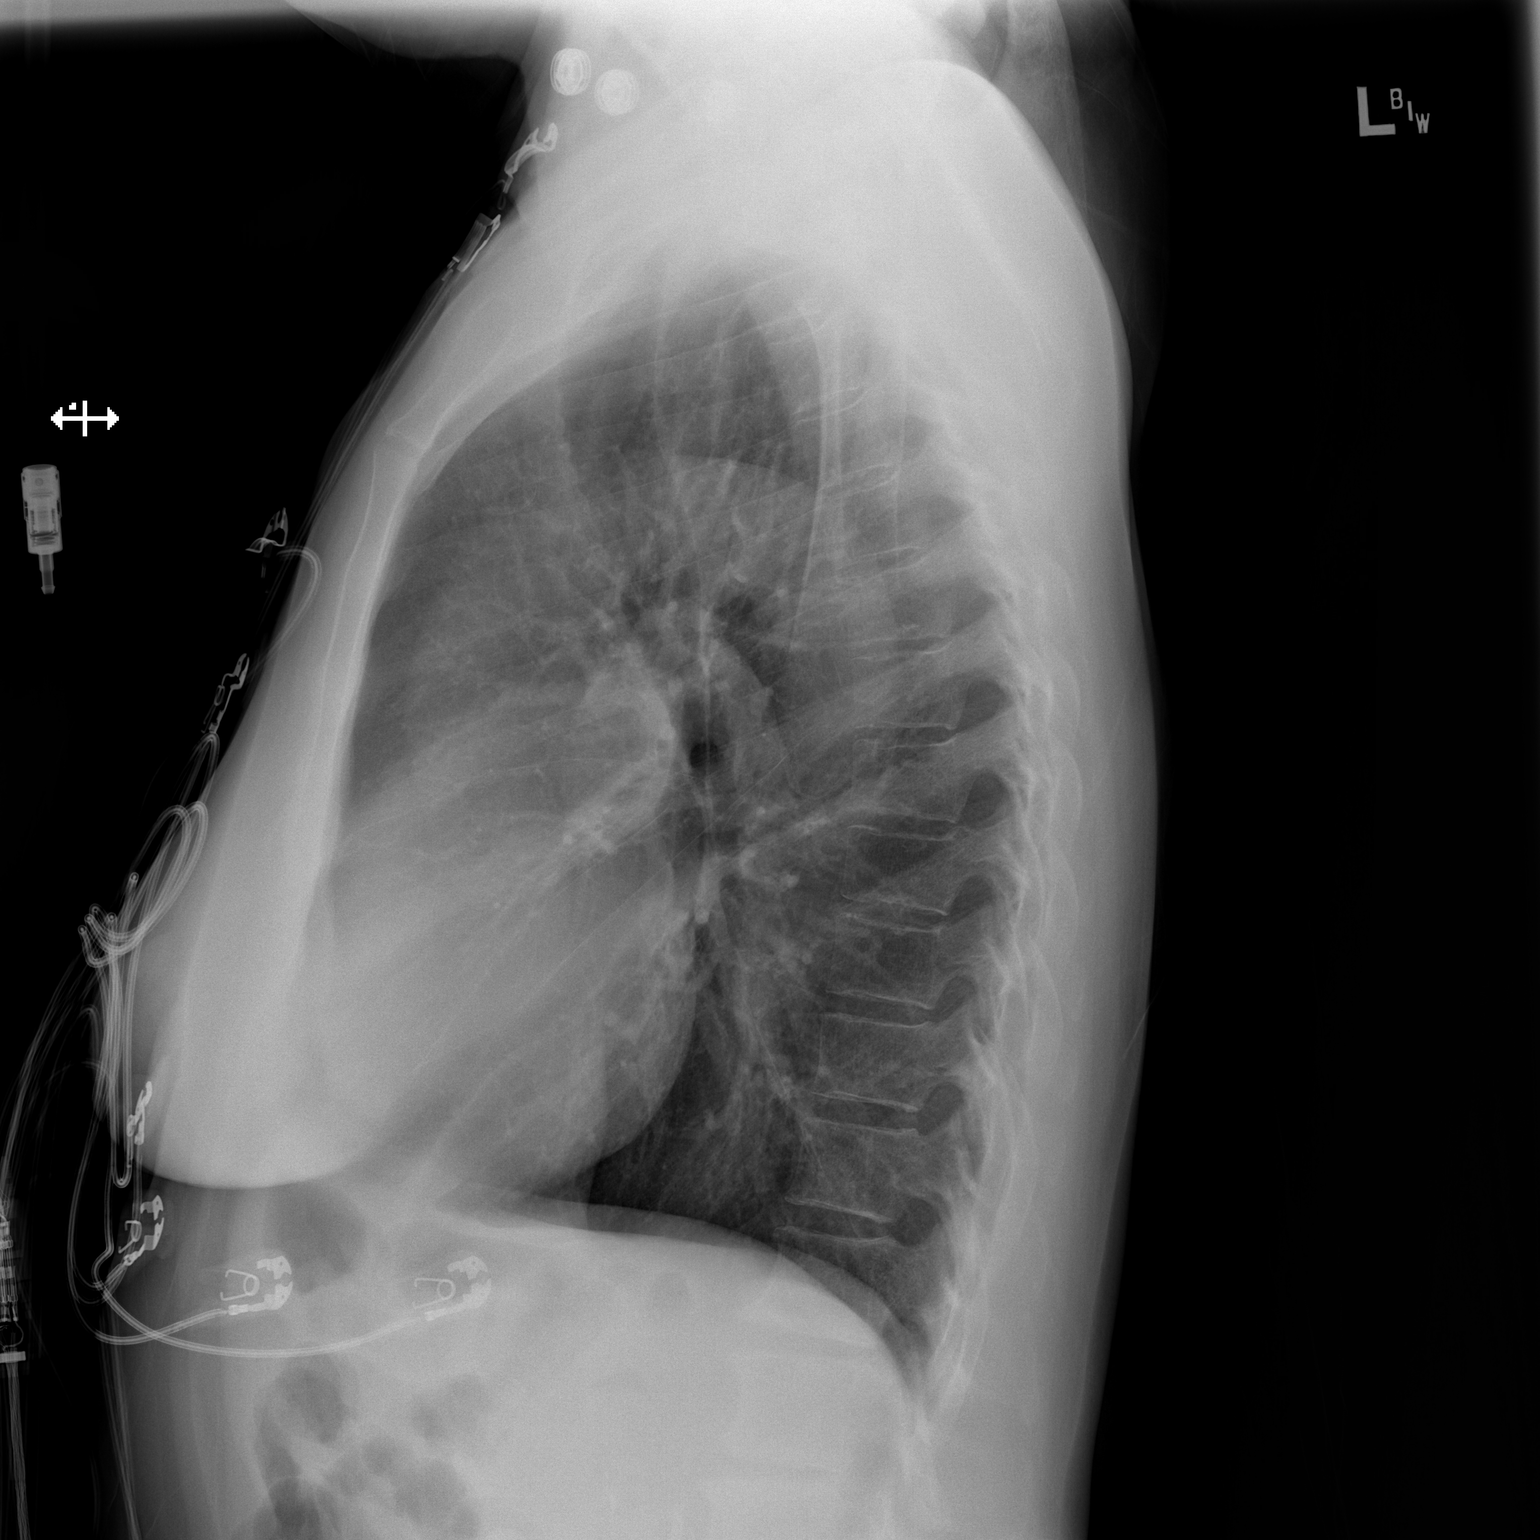

[2 of 2 positions shown; findings below may reference images not displayed]

FINDINGS: The heart size and mediastinal contours are within normal limits.
Both lungs are clear. The visualized skeletal structures are
unremarkable.
IMPRESSION: No active cardiopulmonary disease.

## 2019-06-03 DIAGNOSIS — J4520 Mild intermittent asthma, uncomplicated: Secondary | ICD-10-CM | POA: Diagnosis not present

## 2019-06-03 DIAGNOSIS — J45901 Unspecified asthma with (acute) exacerbation: Secondary | ICD-10-CM | POA: Diagnosis not present

## 2019-06-04 ENCOUNTER — Telehealth: Payer: Self-pay | Admitting: Internal Medicine

## 2019-06-04 NOTE — Telephone Encounter (Signed)
Patient requesting a call back

## 2019-06-15 ENCOUNTER — Encounter (HOSPITAL_COMMUNITY): Payer: Self-pay | Admitting: Emergency Medicine

## 2019-06-15 ENCOUNTER — Other Ambulatory Visit: Payer: Self-pay

## 2019-06-15 ENCOUNTER — Emergency Department (HOSPITAL_COMMUNITY): Payer: Medicare Other

## 2019-06-15 ENCOUNTER — Emergency Department (HOSPITAL_COMMUNITY)
Admission: EM | Admit: 2019-06-15 | Discharge: 2019-06-15 | Disposition: A | Discharge status: Home / Self Care | Payer: Medicare Other | Attending: Emergency Medicine | Admitting: Emergency Medicine

## 2019-06-15 DIAGNOSIS — S161XXA Strain of muscle, fascia and tendon at neck level, initial encounter: Secondary | ICD-10-CM | POA: Diagnosis not present

## 2019-06-15 DIAGNOSIS — S199XXA Unspecified injury of neck, initial encounter: Secondary | ICD-10-CM | POA: Diagnosis not present

## 2019-06-15 DIAGNOSIS — M79605 Pain in left leg: Secondary | ICD-10-CM | POA: Insufficient documentation

## 2019-06-15 DIAGNOSIS — J45909 Unspecified asthma, uncomplicated: Secondary | ICD-10-CM | POA: Diagnosis not present

## 2019-06-15 DIAGNOSIS — S0990XA Unspecified injury of head, initial encounter: Secondary | ICD-10-CM | POA: Diagnosis not present

## 2019-06-15 DIAGNOSIS — Y93E9 Activity, other interior property and clothing maintenance: Secondary | ICD-10-CM | POA: Insufficient documentation

## 2019-06-15 DIAGNOSIS — M25462 Effusion, left knee: Secondary | ICD-10-CM | POA: Diagnosis not present

## 2019-06-15 DIAGNOSIS — W06XXXA Fall from bed, initial encounter: Secondary | ICD-10-CM | POA: Insufficient documentation

## 2019-06-15 DIAGNOSIS — S4992XA Unspecified injury of left shoulder and upper arm, initial encounter: Secondary | ICD-10-CM | POA: Diagnosis not present

## 2019-06-15 DIAGNOSIS — S79912A Unspecified injury of left hip, initial encounter: Secondary | ICD-10-CM | POA: Diagnosis not present

## 2019-06-15 DIAGNOSIS — M25512 Pain in left shoulder: Secondary | ICD-10-CM

## 2019-06-15 DIAGNOSIS — Z87891 Personal history of nicotine dependence: Secondary | ICD-10-CM | POA: Diagnosis not present

## 2019-06-15 DIAGNOSIS — Y929 Unspecified place or not applicable: Secondary | ICD-10-CM | POA: Insufficient documentation

## 2019-06-15 DIAGNOSIS — I10 Essential (primary) hypertension: Secondary | ICD-10-CM | POA: Diagnosis not present

## 2019-06-15 DIAGNOSIS — M25522 Pain in left elbow: Secondary | ICD-10-CM | POA: Diagnosis not present

## 2019-06-15 DIAGNOSIS — R270 Ataxia, unspecified: Secondary | ICD-10-CM | POA: Diagnosis not present

## 2019-06-15 DIAGNOSIS — Y999 Unspecified external cause status: Secondary | ICD-10-CM | POA: Diagnosis not present

## 2019-06-15 DIAGNOSIS — S59902A Unspecified injury of left elbow, initial encounter: Secondary | ICD-10-CM | POA: Diagnosis not present

## 2019-06-15 DIAGNOSIS — W19XXXA Unspecified fall, initial encounter: Secondary | ICD-10-CM

## 2019-06-15 MED ORDER — ACETAMINOPHEN 325 MG PO TABS
650.0000 mg | ORAL_TABLET | Freq: Once | ORAL | Status: DC
  Filled 2019-06-15: qty 2

## 2019-06-15 MED ORDER — RIVAROXABAN 20 MG PO TABS
20.0000 mg | ORAL_TABLET | Freq: Once | ORAL | Status: AC
  Administered 2019-06-15: 20 mg via ORAL
  Filled 2019-06-15: qty 1

## 2019-06-15 MED ORDER — HYDROCODONE-ACETAMINOPHEN 5-325 MG PO TABS
1.0000 | ORAL_TABLET | Freq: Once | ORAL | Status: AC
  Administered 2019-06-15: 1 via ORAL
  Filled 2019-06-15: qty 1

## 2019-06-15 MED ORDER — TIZANIDINE HCL 2 MG PO TABS: 2.0000 mg | ORAL_TABLET | Freq: Four times a day (QID) | ORAL | 0 refills | Status: DC | PRN

## 2019-06-15 NOTE — ED Triage Notes (Signed)
Pt here due to a fall off of her bed while putting up curtains. Pt states she is sore on the left side of her neck and whole left side of her body. Pt takes a blood thinner and wanted to come in to make sure her head was ok.

## 2019-06-15 NOTE — ED Provider Notes (Signed)
Emergency Department Provider Note   I have reviewed the triage vital signs and the nursing notes.   HISTORY  Chief Complaint Fall   HPI [name] is a 68 y.o. female with past medical history reviewed below presents to the emergency department for evaluation after mechanical fall 2 days prior.  Patient states she was standing up in her bed attempting to put up curtains when she lost her balance and fell backwards.  She landed primarily on her left side and has pain over the entire left side of her body since that time.  She describes headache, neck pain, left arm pain, left leg pain.  She denies any mid or lower back pain.  No abdominal discomfort.  Denies chest pain or shortness of breath symptoms.  She has had some throbbing/tingling in the fingers of the left upper extremity which have been intermittent.  No left upper extremity weakness or numbness in the arm. Pain is moderate to severe and worse with movement. No syncope/near-syncope symptoms.   Past Medical History:  Diagnosis Date  . A-fib ([place])   . Allergy    seasonal  . Asthma   . Chronic anticoagulation   . Eczema   . GERD (gastroesophageal reflux disease)   . History of kidney stones   . Hypertension   . PAF (paroxysmal atrial fibrillation) ([place]) 11/26/2015   April/May 2017: Multiple ED visits for A. Fib.  02/12/16: Improved on diltiazem IV. Cardiology consulted in the ED and discharge patient with diltiazem XR 120 mg once daily with 60 mg by mouth every 6 hours as needed for symptomatic palpitations, flecainide 50 mg twice daily. Referred to the atrial fibrillation clinic.  02/17/16: Seen in the atrial fibrillation clinic by NP Roderic Palau. Normal sin    Patient Active Problem List   Diagnosis Date Noted  . Gallstones 12/08/2018  . Choledocholithiasis   . Vaginal discharge 10/11/2018  . Vaginal bleeding 10/11/2018  . History of radiofrequency ablation (RFA) procedure for cardiac arrhythmia   . Asthma  exacerbation 08/28/2018  . Chronic congestion of paranasal sinus 06/14/2018  . Primary osteoarthritis involving multiple joints 06/14/2018  . Acute pain of left knee 05/15/2018  . Hyperpigmented skin lesion 09/09/2017  . Osteoporosis without current pathological fracture 04/15/2017  . Normocytic anemia 10/19/2016  . Hyperlipidemia 03/18/2016  . Special screening for malignant neoplasms, colon 03/17/2016  . Cervical cancer screening 03/17/2016  . Assistance with transportation 03/17/2016  . HTN (hypertension), benign   . GERD (gastroesophageal reflux disease)   . PAF (paroxysmal atrial fibrillation) ([place]) 11/26/2015  . Chronic anticoagulation 11/26/2015  . Marijuana abuse 06/11/2015  . Hypokalemia 01/07/2012    Past Surgical History:  Procedure Laterality Date  . ATRIAL FIBRILLATION ABLATION N/A 09/30/2017   Procedure: ATRIAL FIBRILLATION ABLATION;  Surgeon: Thompson Grayer, MD;  Location: [facility];  Service: Cardiovascular;  Laterality: N/A;  . CHOLECYSTECTOMY N/A 12/08/2018   Procedure: LAPAROSCOPIC CHOLECYSTECTOMY WITH INTRAOPERATIVE CHOLANGIOGRAM;  Surgeon: Jovita Kussmaul, MD;  Location: [facility];  Service: General;  Laterality: N/A;  . ERCP N/A 12/09/2018   Procedure: ENDOSCOPIC RETROGRADE CHOLANGIOPANCREATOGRAPHY (ERCP);  Surgeon: Carol Ada, MD;  Location: [facility];  Service: Endoscopy;  Laterality: N/A;  . SPHINCTEROTOMY  12/09/2018   Procedure: SPHINCTEROTOMY;  Surgeon: Carol Ada, MD;  Location: [facility];  Service: Endoscopy;;  . TONSILLECTOMY    . TUBAL LIGATION      Allergies Albuterol, Percocet [oxycodone-acetaminophen], Celecoxib, and Protonix [pantoprazole sodium]  Family History  Problem Relation  Age of Onset  . Heart attack Mother        [name] age  . Breast cancer Sister        Late 96s; now s/p mastectomy   . Lung cancer Brother        x 2  . Cancer Sister   . Cancer Sister   . Cancer Sister   . Colon cancer Neg Hx   . Colon polyps Neg Hx    . Esophageal cancer Neg Hx   . Rectal cancer Neg Hx   . Stomach cancer Neg Hx     Social History Social History   Tobacco Use  . Smoking status: Former Smoker    Quit date: 10/24/2015    Years since quitting: 3.6  . Smokeless tobacco: Never Used  Substance Use Topics  . Alcohol use: Yes  . Drug use: Not Currently    Types: Marijuana    Comment: Smoked marijuana in the past.    Review of Systems  Constitutional: No fever/chills Eyes: No visual changes. ENT: No sore throat. Cardiovascular: Denies chest pain. Respiratory: Denies shortness of breath. Gastrointestinal: No abdominal pain.  No nausea, no vomiting.  No diarrhea.  No constipation. Genitourinary: Negative for dysuria. Musculoskeletal: Negative for back pain. Positive neck pain, left arm pain, and left leg pain.  Skin: Negative for rash. Neurological: Negative for focal weakness or numbness. Positive HA.   10-point ROS otherwise negative.  ____________________________________________   PHYSICAL EXAM:  VITAL SIGNS: ED Triage Vitals [06/15/19 1241]  Enc Vitals Group     BP (!) 144/60     Pulse Rate 60     Resp 16     Temp 98.2 F (36.8 C)     Temp Source Oral     SpO2 99 %   Constitutional: Alert and oriented. Well appearing and in no acute distress. Eyes: Conjunctivae are normal. PERRL.  Head: Atraumatic. Nose: No congestion/rhinnorhea. Mouth/Throat: Mucous membranes are moist.  Oropharynx non-erythematous. Neck: No stridor. Mostly left paracervical spine tenderness with tenderness over c4/5 region.  Cardiovascular: Normal rate, regular rhythm. Good peripheral circulation. Grossly normal heart sounds.   Respiratory: Normal respiratory effort.  No retractions. Lungs CTAB. Gastrointestinal: Soft and nontender. No distention.  Musculoskeletal: Pain with range of motion of the left shoulder and elbow without effusion or deformity.  Patient with tenderness to the posterior knee without left leg swelling.   No leg deformity.  Limited range of motion of the left hip and left knee secondary to pain.  Neurologic:  Normal speech and language. No gross focal neurologic deficits are appreciated.  Skin:  Skin is warm, dry and intact. No rash noted.  ____________________________________________  RADIOLOGY  Dg Elbow Complete Left  Result Date: 06/15/2019 CLINICAL DATA:  Fall with elbow pain EXAM: LEFT ELBOW - COMPLETE 3+ VIEW COMPARISON:  None. FINDINGS: There is no evidence of fracture, dislocation, or joint effusion. There is no evidence of arthropathy or other focal bone abnormality. Soft tissues are unremarkable. IMPRESSION: Negative. Electronically Signed   By: Donavan Foil M.D.   On: 06/15/2019 20:59   Ct Head Wo Contrast  Result Date: 06/15/2019 CLINICAL DATA:  Ataxia following fall EXAM: CT HEAD WITHOUT CONTRAST TECHNIQUE: Contiguous axial images were obtained from the base of the skull through the vertex without intravenous contrast. COMPARISON:  May 20, 2019 FINDINGS: Brain: There is age related volume loss. There is no intracranial mass, hemorrhage, extra-axial fluid collection, or midline shift. There is slight small vessel disease in  the centra semiovale bilaterally, stable. Elsewhere the brain parenchyma appears unremarkable. There is no demonstrable acute infarct. Vascular: There is no hyperdense vessel. There is no appreciable vascular calcification. Skull: The bony calvarium appears intact. Sinuses/Orbits: There is opacification in several ethmoid air cells. Orbits appear symmetric bilaterally. Other: Mastoid air cells are clear. IMPRESSION: Age related volume loss with mild periventricular small vessel disease. No acute infarct. No mass or hemorrhage. There is opacification in several ethmoid air cells. Electronically Signed   By: Lowella Grip III M.D.   On: 06/15/2019 13:47   Ct Cervical Spine Wo Contrast  Result Date: 06/15/2019 CLINICAL DATA:  68 year old female with C-spine  trauma. EXAM: CT CERVICAL SPINE WITHOUT CONTRAST TECHNIQUE: Multidetector CT imaging of the cervical spine was performed without intravenous contrast. Multiplanar CT image reconstructions were also generated. COMPARISON:  None. FINDINGS: Alignment: No acute subluxation. There is reversal of normal cervical lordosis which may be positional or due to muscle spasm or secondary to degenerative changes. Skull base and vertebrae: No acute fracture. Soft tissues and spinal canal: No prevertebral fluid or swelling. No visible canal hematoma. Disc levels: Extensive multilevel degenerative changes with endplate irregularity and disc space narrowing. Upper chest: Negative. Other: Atherosclerotic calcification of the visualized aortic arch. IMPRESSION: 1. No acute/traumatic cervical spine pathology. 2. Extensive multilevel degenerative changes. Electronically Signed   By: Anner Crete M.D.   On: 06/15/2019 21:36   Dg Shoulder Left  Result Date: 06/15/2019 CLINICAL DATA:  Fall with shoulder pain EXAM: LEFT SHOULDER - 2+ VIEW COMPARISON:  None. FINDINGS: There is no evidence of fracture or dislocation. There is no evidence of arthropathy or other focal bone abnormality. Soft tissues are unremarkable. IMPRESSION: Negative. Electronically Signed   By: Donavan Foil M.D.   On: 06/15/2019 20:58   Dg Knee Complete 4 Views Left  Result Date: 06/15/2019 CLINICAL DATA:  Fall EXAM: LEFT KNEE - COMPLETE 4+ VIEW COMPARISON:  None. FINDINGS: No fracture or dislocation of the left knee. There is mild lateral compartment joint space loss and osteophytosis with otherwise preserved joint spaces. There is a moderate, nonspecific knee joint effusion. Vascular calcinosis. IMPRESSION: 1. No fracture or dislocation of the left knee. There is mild lateral compartment joint space loss and osteophytosis with otherwise preserved joint spaces. 2.  There is a moderate, nonspecific knee joint effusion. Electronically Signed   By: Eddie Candle  M.D.   On: 06/15/2019 20:57   Dg Hip Unilat W Or Wo Pelvis 2-3 Views Left  Result Date: 06/15/2019 CLINICAL DATA:  Fall with hip pain EXAM: DG HIP (WITH OR WITHOUT PELVIS) 2-3V LEFT COMPARISON:  None. FINDINGS: There is no evidence of hip fracture or dislocation. There is no evidence of arthropathy or other focal bone abnormality. IMPRESSION: Negative. Electronically Signed   By: Donavan Foil M.D.   On: 06/15/2019 20:59    ____________________________________________   PROCEDURES  Procedure(s) performed:   Procedures  None ____________________________________________   INITIAL IMPRESSION / ASSESSMENT AND PLAN / ED COURSE  Pertinent labs & imaging results that were available during my care of the patient were reviewed by me and considered in my medical decision making (see chart for details).   Patient presents to the emergency department for evaluation after fall 2 days ago.  Her CT scan from the head was interpreted with no acute changes.  I have added CT C-spine with intermittent throbbing type pain in the left upper extremity.  She has an intact neurologic exam of both her upper  and lower extremities, however.  Plan for plain films of the left shoulder, elbow, hip, knee.   Plain films and CT c spine and plain films reviewed. No acute findings. Patient asking for Vicodin for home use. In review of the [place] drug database the patient has frequent prescriptions for Vicodin from the ED. Discussed this with her and that I am not comfortable with this Rx for MSK pain after a fall several days prior. Did provide an Rx for Zanaflex but advised Tylenol/Motrin PRN pain with continued  ____________________________________________  FINAL CLINICAL IMPRESSION(S) / ED DIAGNOSES  Final diagnoses:  Fall, initial encounter  Injury of head, initial encounter  Strain of neck muscle, initial encounter  Acute pain of left shoulder  Left leg pain     MEDICATIONS GIVEN DURING THIS  VISIT:  Medications  HYDROcodone-acetaminophen (NORCO/VICODIN) 5-325 MG per tablet 1 tablet (1 tablet Oral Given 06/15/19 2052)  rivaroxaban (XARELTO) tablet 20 mg (20 mg Oral Given 06/15/19 2052)     NEW OUTPATIENT MEDICATIONS STARTED DURING THIS VISIT:  Discharge Medication List as of 06/15/2019  9:47 PM    START taking these medications   Details  tiZANidine (ZANAFLEX) 2 MG tablet Take 1 tablet (2 mg total) by mouth every 6 (six) hours as needed for muscle spasms., Starting Fri 06/15/2019, Print        Note:  This document was prepared using Dragon voice recognition software and may include unintentional dictation errors.  Nanda Quinton, MD, [facility] Emergency [facility]    Amaad Byers, [facility], MD 06/16/19 [phone]

## 2019-06-15 NOTE — ED Notes (Signed)
Pt transported to CT ?

## 2019-06-15 NOTE — ED Notes (Signed)
Patient transported to CT 

## 2019-06-15 NOTE — Discharge Instructions (Signed)

## 2019-06-26 ENCOUNTER — Other Ambulatory Visit: Payer: Self-pay

## 2019-06-26 ENCOUNTER — Other Ambulatory Visit: Payer: Self-pay | Admitting: Student in an Organized Health Care Education/Training Program

## 2019-06-26 MED ORDER — POTASSIUM CHLORIDE CRYS ER 10 MEQ PO TBCR: 20.0000 meq | EXTENDED_RELEASE_TABLET | Freq: Two times a day (BID) | ORAL | 1 refills | Status: DC

## 2019-06-26 MED ORDER — LINACLOTIDE 145 MCG PO CAPS: ORAL_CAPSULE | ORAL | 1 refills | Status: DC

## 2019-06-26 MED ORDER — DILTIAZEM HCL ER COATED BEADS 120 MG PO CP24: 120.0000 mg | ORAL_CAPSULE | Freq: Two times a day (BID) | ORAL | 2 refills | Status: DC

## 2019-06-26 NOTE — Telephone Encounter (Signed)
Requesting to speak with a nurse about getting allergy med to be filled. Please call pt back.

## 2019-06-26 NOTE — Telephone Encounter (Signed)
Needs refills on   potassium chloride SA (K-DUR) 10 MEQ tablet(Expired)  diltiazem (CARTIA XT) 120 MG 24 hr capsule (allergy medicine)   ;pt contact [facility], [place] [address]

## 2019-06-26 NOTE — Telephone Encounter (Signed)
Pt also need a refill on   LINZESS 145 MCG CAPS capsule

## 2019-07-02 ENCOUNTER — Other Ambulatory Visit: Payer: Self-pay | Admitting: Student in an Organized Health Care Education/Training Program

## 2019-07-02 ENCOUNTER — Ambulatory Visit: Payer: Medicare Other

## 2019-07-02 MED ORDER — LOSARTAN POTASSIUM 50 MG PO TABS: ORAL_TABLET | ORAL | 1 refills | Status: DC

## 2019-07-02 NOTE — Telephone Encounter (Signed)
Pt is calling back, pt head is hurting she haven't took her bp medicine, pls contact [phone]

## 2019-07-02 NOTE — Telephone Encounter (Signed)
Needs refill on losartan (COZAAR) 50 MG tablet ;pt contact [place], [place]

## 2019-07-10 MED ORDER — FLUTICASONE PROPIONATE HFA 44 MCG/ACT IN AERO: 1.0000 | INHALATION_SPRAY | Freq: Every day | RESPIRATORY_TRACT | 5 refills | Status: DC

## 2019-07-23 ENCOUNTER — Other Ambulatory Visit: Payer: Self-pay

## 2019-07-23 ENCOUNTER — Ambulatory Visit (INDEPENDENT_AMBULATORY_CARE_PROVIDER_SITE_OTHER): Payer: Medicare Other | Admitting: Internal Medicine

## 2019-07-23 ENCOUNTER — Ambulatory Visit (HOSPITAL_COMMUNITY)
Admission: RE | Admit: 2019-07-23 | Discharge: 2019-07-23 | Disposition: A | Discharge status: Home / Self Care | Payer: Medicare Other | Source: Ambulatory Visit | Attending: Family Medicine | Admitting: Family Medicine

## 2019-07-23 VITALS — BP 131/80 | HR 81 | Temp 98.4°F | Ht 67.0 in | Wt 151.5 lb

## 2019-07-23 DIAGNOSIS — K5900 Constipation, unspecified: Secondary | ICD-10-CM

## 2019-07-23 DIAGNOSIS — R079 Chest pain, unspecified: Secondary | ICD-10-CM | POA: Insufficient documentation

## 2019-07-23 DIAGNOSIS — Z79899 Other long term (current) drug therapy: Secondary | ICD-10-CM | POA: Diagnosis not present

## 2019-07-23 DIAGNOSIS — K219 Gastro-esophageal reflux disease without esophagitis: Secondary | ICD-10-CM | POA: Diagnosis not present

## 2019-07-23 DIAGNOSIS — R0789 Other chest pain: Secondary | ICD-10-CM | POA: Insufficient documentation

## 2019-07-23 MED ORDER — ONDANSETRON 4 MG PO TBDP: 4.0000 mg | ORAL_TABLET | Freq: Three times a day (TID) | ORAL | 0 refills | Status: DC | PRN

## 2019-07-23 MED ORDER — CETIRIZINE HCL 10 MG PO TABS: 10.0000 mg | ORAL_TABLET | Freq: Every day | ORAL | 2 refills | Status: DC

## 2019-07-23 MED ORDER — OMEPRAZOLE 20 MG PO CPDR: 20.0000 mg | DELAYED_RELEASE_CAPSULE | Freq: Every day | ORAL | 0 refills | Status: DC

## 2019-07-23 NOTE — Assessment & Plan Note (Signed)
Patient reports persistent daily symptoms of burning sensation in stomach, chest.  Patient reports she has tried over-the-counter Pepcid without relief. Will prescribe Omeprazole 20 mg daily. Patient has chart listed allergy to Protonix (palpitations). Patient does not recall this allergy and patient was advised to discontinue medication and notify us if she experiences this symptom. * Omeprazole 20 mg Daily

## 2019-07-23 NOTE — Patient Instructions (Addendum)
You were seen for followup on your constipation and acid reflux. We have sent a prescription for omeprazole for your acid reflux as well as Zofran for your nausea.    We have also sent you prescription for cetirizine for your nasal symptoms.  Please seek medical attention immediately if you have new or worsening chest pain.

## 2019-07-23 NOTE — Assessment & Plan Note (Signed)
Patient describes 3-week history of substernal chest pain, associated with both her reflux and constipation.  Patient denies worsening with changes in position, exercise.  Patient denies current or past tobacco usage or history of myocardial infarction.  Patient does report that her mother died of a heart attack when the patient was 68 years old. *EKG was obtained in clinic without signs of acute ischemia *Chest pain is likely secondary to uncontrolled acid reflux.  Will reassess at future visit.

## 2019-07-23 NOTE — Progress Notes (Signed)
   CC: Reflux and constipation  HPI: Patient is a 68 year old female with PMH as below who presents for discussion on reflux and constipation.  [name] is a 68 y.o.   Past Medical History:  Diagnosis Date  . A-fib ([place])   . Allergy    seasonal  . Asthma   . Chronic anticoagulation   . Eczema   . GERD (gastroesophageal reflux disease)   . History of kidney stones   . Hypertension   . PAF (paroxysmal atrial fibrillation) ([place]) 11/26/2015   April/May 2017: Multiple ED visits for A. Fib.  02/12/16: Improved on diltiazem IV. Cardiology consulted in the ED and discharge patient with diltiazem XR 120 mg once daily with 60 mg by mouth every 6 hours as needed for symptomatic palpitations, flecainide 50 mg twice daily. Referred to the atrial fibrillation clinic.  02/17/16: Seen in the atrial fibrillation clinic by NP Roderic Palau. Normal sin   Review of Systems:   Review of Systems  Constitutional: Negative for chills and fever.  HENT: Negative for congestion.   Respiratory: Negative for cough and shortness of breath.   Cardiovascular: Negative for chest pain.  Gastrointestinal: Negative for abdominal pain, constipation, diarrhea, nausea and vomiting.  Genitourinary: Negative for dysuria, frequency and urgency.  All other systems reviewed and are negative.   Physical Exam:  Vitals:   07/23/19 0842  BP: 131/80  Pulse: 81  Temp: 98.4 F (36.9 C)  TempSrc: Oral  SpO2: 100%  Weight: 151 lb 8 oz (68.7 kg)  Height: 5\' 7"  (1.702 m)   Physical Exam  Constitutional: She is well-developed, well-nourished, and in no distress.  HENT:  Head: Normocephalic and atraumatic.  Eyes: EOM are normal. Right eye exhibits no discharge. Left eye exhibits no discharge.  Neck: Normal range of motion. No tracheal deviation present.  Cardiovascular: Normal rate and regular rhythm. Exam reveals no gallop and no friction rub.  No murmur heard. Pulmonary/Chest: Effort normal and breath sounds  normal. No respiratory distress. She has no wheezes. She has no rales.  Abdominal: Soft. She exhibits no distension. There is no abdominal tenderness. There is no rebound and no guarding.  Musculoskeletal: Normal range of motion.        General: No tenderness, deformity or edema.  Neurological: She is alert. Coordination normal.  Skin: Skin is warm and dry. No rash noted. She is not diaphoretic. No erythema.  Psychiatric: Memory and judgment normal.     Assessment & Plan:   See Encounters Tab for problem based charting.  Patient seen and discussed with Dr. Rebeca Alert

## 2019-07-23 NOTE — Assessment & Plan Note (Addendum)
Patient describes a long history of constipation.  Patient states she is able to have bowel movements but requires a lot of straining.  Patient states she has previously tried MiraLAX, milk of magnesia, senna without relief.    Advised patient to take MiraLAX 17 g daily and slowly uptitrate this medication until she is having regular bowel movements.  Patient reports associated nausea for which she previously took Zofran ODT with relief.  Patient provided with prescription for Zofran ODT.  QTC of 421 on EKG from today.

## 2019-07-24 ENCOUNTER — Other Ambulatory Visit: Payer: Self-pay | Admitting: Internal Medicine

## 2019-07-24 ENCOUNTER — Telehealth: Payer: Self-pay | Admitting: Internal Medicine

## 2019-07-24 NOTE — Progress Notes (Signed)
Internal Medicine Clinic Attending  I saw and evaluated the patient.  I personally confirmed the key portions of the history and exam documented by Dr. MacLean and I reviewed pertinent patient test results.  The assessment, diagnosis, and plan were formulated together and I agree with the documentation in the resident's note.  Alexander Raines, M.D., Ph.D.  

## 2019-07-24 NOTE — Progress Notes (Signed)
Spoke with patient about omeprazole. Patient states stomach pain has not improved with omeprazole. I counseled patient that it can take a couple of weeks before it takes effect. Patient asked for something for immediate relief. I advised patient to take famotidine 20 mg BID in addition to the omeprazole for acid relief.

## 2019-07-24 NOTE — Telephone Encounter (Signed)
Pt has having side effects from the pills yesterday, pls contact [phone]

## 2019-07-24 NOTE — Telephone Encounter (Signed)
Called and discussed with patient

## 2019-07-24 NOTE — Telephone Encounter (Signed)
Return pt's call - stated she was started on a new med, Omeprazole, yesterday and was informed it may cause "very bad side effects". Stated she woke up this morning with "right side (flank) pain". She would like to change to a different medication. Thanks

## 2019-07-26 ENCOUNTER — Other Ambulatory Visit: Payer: Self-pay | Admitting: Internal Medicine

## 2019-07-26 NOTE — Telephone Encounter (Signed)
Pt is calling back, pt needs refill ASAP ; pt contact [phone]

## 2019-07-26 NOTE — Telephone Encounter (Signed)
Needs refill on levalbuterol [facility] HFA) 45 MCG/ACT inhaler  ;pt contact [place], [place]

## 2019-07-27 ENCOUNTER — Other Ambulatory Visit (HOSPITAL_COMMUNITY): Payer: Self-pay | Admitting: *Deleted

## 2019-07-27 MED ORDER — TIOTROPIUM BROMIDE MONOHYDRATE 18 MCG IN CAPS: 18.0000 ug | ORAL_CAPSULE | Freq: Every day | RESPIRATORY_TRACT | 0 refills | Status: DC

## 2019-07-27 MED ORDER — DILTIAZEM HCL 60 MG PO TABS: 60.0000 mg | ORAL_TABLET | Freq: Four times a day (QID) | ORAL | 1 refills | Status: DC | PRN

## 2019-07-27 NOTE — Telephone Encounter (Signed)
Needs a refill on tiotropium (SPIRIVA) 18 MCG inhalation capsule  ;pt contact     [place], [place]

## 2019-07-27 NOTE — Telephone Encounter (Signed)
Patient has been prescribed Xopenx and Spirvia for Dx of Asthma, she also has ? Of diagnosis of COPD.  Looking through her chart she has never had PFTs which would be important, she was referred to pulmonology in [place] but did not attend.  If she has asthma and not COPD I am not sure that she is being optimally managed,  Therefore I will not provide a 6 month Rx of sprivia and only a 30 day refill to allow for [name] to obtain PFTs or pulmonology evaluation.

## 2019-07-27 NOTE — Telephone Encounter (Signed)
Pt is calling back [phone] regarding her medicine, pls contact her

## 2019-07-30 ENCOUNTER — Telehealth: Payer: Self-pay

## 2019-07-30 ENCOUNTER — Telehealth: Payer: Self-pay | Admitting: Internal Medicine

## 2019-07-30 NOTE — Telephone Encounter (Signed)
Pt is sch for a Telehealth visit tomorrow with [facility]  07/31/2019 @ 10:45am

## 2019-07-30 NOTE — Telephone Encounter (Signed)
I returned phone call to patient who was at home.The patient had her first fall and was seen in the Emergency room here at [facility].The patient said nothing was broken but wanted to no if she could get something to rub her legs and neck in because they were a little sore. The patient was not sure if she needed PT or not,but wanted to get something to help with the soreness,she has been taking Tylenol to help.Patient is scheduled for a Tele-visit for 07-31-19 @10 :45

## 2019-07-30 NOTE — Telephone Encounter (Signed)
Pt states she was seen on 07/23/2019 and mentioned she had fallen.  Pt is requesting a referral be sent for Physical therapy.  Please call patient back.

## 2019-07-31 ENCOUNTER — Encounter: Payer: Self-pay | Admitting: Internal Medicine

## 2019-07-31 ENCOUNTER — Other Ambulatory Visit: Payer: Self-pay

## 2019-07-31 ENCOUNTER — Ambulatory Visit (INDEPENDENT_AMBULATORY_CARE_PROVIDER_SITE_OTHER): Payer: Medicare Other | Admitting: Internal Medicine

## 2019-07-31 ENCOUNTER — Ambulatory Visit (HOSPITAL_COMMUNITY): Payer: Medicare Other | Admitting: Nurse Practitioner

## 2019-07-31 ENCOUNTER — Telehealth: Payer: Self-pay | Admitting: Internal Medicine

## 2019-07-31 DIAGNOSIS — Z9181 History of falling: Secondary | ICD-10-CM

## 2019-07-31 DIAGNOSIS — M7918 Myalgia, other site: Secondary | ICD-10-CM | POA: Diagnosis not present

## 2019-07-31 DIAGNOSIS — W19XXXA Unspecified fall, initial encounter: Secondary | ICD-10-CM | POA: Insufficient documentation

## 2019-07-31 DIAGNOSIS — G8911 Acute pain due to trauma: Secondary | ICD-10-CM

## 2019-07-31 DIAGNOSIS — W19XXXD Unspecified fall, subsequent encounter: Secondary | ICD-10-CM

## 2019-07-31 NOTE — Assessment & Plan Note (Addendum)
Fall: Patient stated that she had fallen ~4 weeks ago. She was standing on the bed hanging curtains when she lost her balance. She went to the ER and was imaged there but sent home as nothing major was found. She denied a LOC.  She rubbed Bengay on her legs and arms which has been improving her muscle aches. Today she feels greatly improved. She has been using tylenol and Bengay with benefit.  She states that she cannot take NSAIDs discussed with her pain and discomfort. Given her improvement thus far and the nature of the injury I feel she will continue to improve with time and conservative management. I reviewed the patients images from her fall and ER encounter on the 23rd of October. I do not find there to be a prominent reason of her persistent symptoms aside from bruising to the musculature.  I feel she would need an in person evaluation if this persists for several more weeks given is now been approximately 6 since the fall.  Plan: Tylenol 650 mg every 6 hours as needed Continue to apply BenGay as needed Notify us if her symptoms worsen or fail to improve

## 2019-07-31 NOTE — Progress Notes (Signed)
Internal Medicine Clinic Attending  Case discussed with Dr. Harbrecht at the time of the visit.  We reviewed the resident's history and exam and pertinent patient test results.  I agree with the assessment, diagnosis, and plan of care documented in the resident's note.   

## 2019-07-31 NOTE — Progress Notes (Signed)
I connected with  [name] on 07/31/19 by a video enabled telemedicine application and verified that I am speaking with the correct person using two identifiers.  I discussed the limitations of evaluation and management by telemedicine. The patient expressed understanding and agreed to proceed.    CC: fall with muscle aches  HPI:[name] is a 68 y.o. female who presents for evaluation of fall with muscle aches. Please see individual problem based A/P for details.  Past Medical History:  Diagnosis Date  . A-fib ([place])   . Allergy    seasonal  . Asthma   . Chronic anticoagulation   . Eczema   . GERD (gastroesophageal reflux disease)   . History of kidney stones   . Hypertension   . PAF (paroxysmal atrial fibrillation) ([place]) 11/26/2015   April/May 2017: Multiple ED visits for A. Fib.  02/12/16: Improved on diltiazem IV. Cardiology consulted in the ED and discharge patient with diltiazem XR 120 mg once daily with 60 mg by mouth every 6 hours as needed for symptomatic palpitations, flecainide 50 mg twice daily. Referred to the atrial fibrillation clinic.  02/17/16: Seen in the atrial fibrillation clinic by NP Roderic Palau. Normal sin   Review of Systems:  ROS negative except as per HPI.  Assessment & Plan:   See Encounters Tab for problem based charting.  Patient discussed with Dr. Philipp Bartlett

## 2019-07-31 NOTE — Telephone Encounter (Signed)
Pt is requesting a callback from the nurse [phone]

## 2019-07-31 NOTE — Telephone Encounter (Signed)
Rtc, lm for rtc 

## 2019-08-01 ENCOUNTER — Telehealth: Payer: Self-pay | Admitting: Internal Medicine

## 2019-08-01 DIAGNOSIS — W19XXXS Unspecified fall, sequela: Secondary | ICD-10-CM

## 2019-08-01 MED ORDER — CAPSAICIN 0.1 % EX CREA: 1.0000 g | TOPICAL_CREAM | Freq: Three times a day (TID) | CUTANEOUS | 0 refills | Status: DC | PRN

## 2019-08-01 NOTE — Telephone Encounter (Signed)
Pt calls and states she needs a stronger than OTC creams, states she was told at appt something could be sent to [facility]

## 2019-08-01 NOTE — Telephone Encounter (Signed)
Pt requesting a different "Cream" to rub on.  Pt states what she has is not strong enough.  Please call patient back.

## 2019-08-01 NOTE — Telephone Encounter (Signed)
I have sent is a script for capsaicin cream. If this is unaffordable of if this does not work she will need to be seen in person as persistent pain from an injury that long ago seems out of reason.

## 2019-08-20 ENCOUNTER — Other Ambulatory Visit: Payer: Self-pay | Admitting: Internal Medicine

## 2019-08-20 ENCOUNTER — Other Ambulatory Visit: Payer: Self-pay | Admitting: Student in an Organized Health Care Education/Training Program

## 2019-08-20 DIAGNOSIS — Z1231 Encounter for screening mammogram for malignant neoplasm of breast: Secondary | ICD-10-CM

## 2019-08-23 ENCOUNTER — Other Ambulatory Visit: Payer: Self-pay

## 2019-08-23 ENCOUNTER — Emergency Department (HOSPITAL_COMMUNITY): Payer: Medicare Other

## 2019-08-23 ENCOUNTER — Emergency Department (HOSPITAL_COMMUNITY)
Admission: EM | Admit: 2019-08-23 | Discharge: 2019-08-23 | Disposition: A | Discharge status: Home / Self Care | Payer: Medicare Other | Attending: Emergency Medicine | Admitting: Emergency Medicine

## 2019-08-23 ENCOUNTER — Encounter (HOSPITAL_COMMUNITY): Payer: Self-pay | Admitting: Emergency Medicine

## 2019-08-23 DIAGNOSIS — Z20828 Contact with and (suspected) exposure to other viral communicable diseases: Secondary | ICD-10-CM | POA: Diagnosis not present

## 2019-08-23 DIAGNOSIS — Z7901 Long term (current) use of anticoagulants: Secondary | ICD-10-CM | POA: Diagnosis not present

## 2019-08-23 DIAGNOSIS — I10 Essential (primary) hypertension: Secondary | ICD-10-CM | POA: Insufficient documentation

## 2019-08-23 DIAGNOSIS — Z87891 Personal history of nicotine dependence: Secondary | ICD-10-CM | POA: Insufficient documentation

## 2019-08-23 DIAGNOSIS — Z79899 Other long term (current) drug therapy: Secondary | ICD-10-CM | POA: Insufficient documentation

## 2019-08-23 DIAGNOSIS — J45909 Unspecified asthma, uncomplicated: Secondary | ICD-10-CM | POA: Diagnosis not present

## 2019-08-23 DIAGNOSIS — R0602 Shortness of breath: Secondary | ICD-10-CM | POA: Diagnosis not present

## 2019-08-23 DIAGNOSIS — R079 Chest pain, unspecified: Secondary | ICD-10-CM | POA: Diagnosis not present

## 2019-08-23 LAB — NOVEL CORONAVIRUS, NAA (HOSP ORDER, SEND-OUT TO REF LAB; TAT 18-24 HRS): SARS-CoV-2, NAA: NOT DETECTED

## 2019-08-23 NOTE — ED Notes (Signed)
Pt verbalized understanding of discharge instructions. Follow up care reviewed, pt had no further questions. 

## 2019-08-23 NOTE — ED Triage Notes (Signed)
Pt. Stated, Ive had asthma with SOB for about a week.

## 2019-08-23 NOTE — ED Provider Notes (Signed)
[facility] EMERGENCY DEPARTMENT Provider Note   CSN: [id number] Arrival date & time: 08/23/19  0825     History Chief Complaint  Patient presents with  . Shortness of Breath  . Asthma  . Nasal Congestion    [name] is a 68 y.o. female.  68yo female with past medical history of asthma and PAF on Xarelto presents with complaint of occasional shortness of breath for the past week.  Patient states this typically happens with her asthma when the season changes however she had a friend visit her on Christmas Day who recently tested positive for Covid and she is concerned that her shortness of breath could be related to Covid and would like to be tested.  She denies fevers, chills, chest pain, palpitations, lower extremity swelling, any other complaints or concerns today.  [name] was evaluated in Emergency Department on 08/23/2019 for the symptoms described in the history of present illness. She was evaluated in the context of the global COVID-19 pandemic, which necessitated consideration that the patient might be at risk for infection with the SARS-CoV-2 virus that causes COVID-19. Institutional protocols and algorithms that pertain to the evaluation of patients at risk for COVID-19 are in a state of rapid change based on information released by regulatory bodies including the CDC and federal and state organizations. These policies and algorithms were followed during the patient's care in the ED.         Past Medical History:  Diagnosis Date  . A-fib ([place])   . Allergy    seasonal  . Asthma   . Chronic anticoagulation   . Eczema   . GERD (gastroesophageal reflux disease)   . History of kidney stones   . Hypertension   . PAF (paroxysmal atrial fibrillation) ([place]) 11/26/2015   April/May 2017: Multiple ED visits for A. Fib.  02/12/16: Improved on diltiazem IV. Cardiology consulted in the ED and discharge patient with diltiazem XR 120 mg once daily with 60  mg by mouth every 6 hours as needed for symptomatic palpitations, flecainide 50 mg twice daily. Referred to the atrial fibrillation clinic.  02/17/16: Seen in the atrial fibrillation clinic by NP Roderic Palau. Normal sin    Patient Active Problem List   Diagnosis Date Noted  . Fall 07/31/2019  . Chest pain 07/23/2019  . Gallstones 12/08/2018  . Choledocholithiasis   . Vaginal discharge 10/11/2018  . Vaginal bleeding 10/11/2018  . History of radiofrequency ablation (RFA) procedure for cardiac arrhythmia   . Asthma exacerbation 08/28/2018  . Chronic congestion of paranasal sinus 06/14/2018  . Primary osteoarthritis involving multiple joints 06/14/2018  . Acute pain of left knee 05/15/2018  . Hyperpigmented skin lesion 09/09/2017  . Osteoporosis without current pathological fracture 04/15/2017  . Constipation 10/19/2016  . Normocytic anemia 10/19/2016  . Hyperlipidemia 03/18/2016  . Special screening for malignant neoplasms, colon 03/17/2016  . Cervical cancer screening 03/17/2016  . Assistance with transportation 03/17/2016  . HTN (hypertension), benign   . GERD (gastroesophageal reflux disease)   . PAF (paroxysmal atrial fibrillation) ([place]) 11/26/2015  . Chronic anticoagulation 11/26/2015  . Marijuana abuse 06/11/2015  . Hypokalemia 01/07/2012    Past Surgical History:  Procedure Laterality Date  . ATRIAL FIBRILLATION ABLATION N/A 09/30/2017   Procedure: ATRIAL FIBRILLATION ABLATION;  Surgeon: Thompson Grayer, MD;  Location: [facility];  Service: Cardiovascular;  Laterality: N/A;  . CHOLECYSTECTOMY N/A 12/08/2018   Procedure: LAPAROSCOPIC CHOLECYSTECTOMY WITH INTRAOPERATIVE CHOLANGIOGRAM;  Surgeon: Autumn Messing  III, MD;  Location: [facility];  Service: General;  Laterality: N/A;  . ERCP N/A 12/09/2018   Procedure: ENDOSCOPIC RETROGRADE CHOLANGIOPANCREATOGRAPHY (ERCP);  Surgeon: Carol Ada, MD;  Location: [facility];  Service: Endoscopy;  Laterality: N/A;  . SPHINCTEROTOMY   12/09/2018   Procedure: SPHINCTEROTOMY;  Surgeon: Carol Ada, MD;  Location: [facility];  Service: Endoscopy;;  . TONSILLECTOMY    . TUBAL LIGATION       OB History   No obstetric history on file.     Family History  Problem Relation Age of Onset  . Heart attack Mother        [name] age  . Breast cancer Sister        Late 35s; now s/p mastectomy   . Lung cancer Brother        x 2  . Cancer Sister   . Cancer Sister   . Cancer Sister   . Colon cancer Neg Hx   . Colon polyps Neg Hx   . Esophageal cancer Neg Hx   . Rectal cancer Neg Hx   . Stomach cancer Neg Hx     Social History   Tobacco Use  . Smoking status: Former Smoker    Quit date: 10/24/2015    Years since quitting: 3.8  . Smokeless tobacco: Never Used  Substance Use Topics  . Alcohol use: Yes  . Drug use: Not Currently    Types: Marijuana    Comment: Smoked marijuana in the past.    Home Medications Prior to Admission medications   Medication Sig Start Date End Date Taking? Authorizing Provider  acetaminophen (TYLENOL) 325 MG tablet Take 650 mg by mouth every 6 (six) hours as needed for mild pain.    [provider]  acetaminophen (TYLENOL) 500 MG tablet Take 2 tablets (1,000 mg total) by mouth every 8 (eight) hours as needed. 05/20/19   Charlesetta Shanks, MD  Capsaicin 0.1 % CREA Apply 1 g topically 3 (three) times daily as needed. 08/01/19   Kathi Ludwig, MD  cetirizine (ZYRTEC ALLERGY) 10 MG tablet Take 1 tablet (10 mg total) by mouth daily. 07/23/19 07/22/20  Jeanmarie Hubert, MD  diltiazem (CARDIZEM) 60 MG tablet Take 1 tablet (60 mg total) by mouth every 6 (six) hours as needed (Rapid AFIB over 100). 07/27/19   Sherran Needs, NP  diltiazem (CARTIA XT) 120 MG 24 hr capsule Take 1 capsule (120 mg total) by mouth 2 (two) times daily. 06/26/19   Axel Filler, MD  diphenhydrAMINE (BENADRYL) 2 % cream Apply topically 3 (three) times daily as needed for itching. Patient not taking:  Reported on 05/20/2019 04/04/19   Modena Nunnery D, DO  famotidine (PEPCID) 20 MG tablet Take 1 tablet (20 mg total) by mouth 2 (two) times daily. 05/20/19   Charlesetta Shanks, MD  fluticasone (FLOVENT HFA) 44 MCG/ACT inhaler Inhale 1 puff into the lungs daily. 07/10/19   Axel Filler, MD  HYDROcodone-acetaminophen (NORCO/VICODIN) 5-325 MG tablet Take 1 tablet by mouth every 6 (six) hours as needed. Patient not taking: Reported on 05/20/2019 12/10/18   Johnathan Hausen, MD  hydrOXYzine (ATARAX/VISTARIL) 25 MG tablet Take 1 tablet (25 mg total) by mouth every 6 (six) hours. Patient not taking: Reported on 05/20/2019 03/02/19   Kinnie Feil, PA-C  levalbuterol [facility] HFA) 45 MCG/ACT inhaler INHALE 1 PUFF INTO THE LUNGS EVERY 6 HOURS AS NEEDED FOR SHORTNESS OF BREATH 07/27/19   Lucious Groves, DO  levalbuterol Penne Lash) 0.63 MG/3ML nebulizer  solution Take 3 mLs (0.63 mg total) by nebulization every 2 (two) hours as needed for wheezing or shortness of breath. 04/07/19   Tillie Fantasia, MD  linaclotide [facility]) 145 MCG CAPS capsule TAKE 1 CAPSULE(145 MCG) BY MOUTH DAILY BEFORE BREAKFAST 06/26/19   Axel Filler, MD  losartan (COZAAR) 50 MG tablet TAKE 1 TABLET(50 MG) BY MOUTH DAILY 07/02/19   Axel Filler, MD  magnesium hydroxide (MILK OF MAGNESIA) 400 MG/5ML suspension Take 15-30 mLs by mouth at bedtime as needed for mild constipation. 11/01/17   Harris, Abigail, PA-C  montelukast (SINGULAIR) 10 MG tablet TAKE 1 TABLET(10 MG) BY MOUTH AT BEDTIME Patient taking differently: Take 10 mg by mouth daily as needed (asthma).  01/12/19   [name], MD  omeprazole (PRILOSEC) 20 MG capsule Take 1 capsule (20 mg total) by mouth daily. 07/23/19   Jeanmarie Hubert, MD  ondansetron (ZOFRAN ODT) 4 MG disintegrating tablet Take 1 tablet (4 mg total) by mouth every 8 (eight) hours as needed for nausea or vomiting. 07/23/19   Jeanmarie Hubert, MD  potassium chloride (KLOR-CON) 10 MEQ tablet  Take 2 tablets (20 mEq total) by mouth 2 (two) times daily. 06/26/19 07/26/19  Axel Filler, MD  rivaroxaban (XARELTO) 20 MG TABS tablet Take 1 tablet (20 mg total) by mouth daily with supper. 01/01/19   Sherran Needs, NP  sorbitol 70 % solution Take 15 mLs by mouth daily as needed. Patient not taking: Reported on 05/20/2019 04/04/19   Modena Nunnery D, DO  sucralfate (CARAFATE) 1 g tablet Take 1 tablet (1 g total) by mouth 4 (four) times daily. Patient not taking: Reported on 05/20/2019 10/16/18 10/16/19  Oval Linsey, MD  tiotropium [facility]) 18 MCG inhalation capsule Place 1 capsule (18 mcg total) into inhaler and inhale daily. 07/27/19   Lucious Groves, DO  tiZANidine (ZANAFLEX) 2 MG tablet Take 1 tablet (2 mg total) by mouth every 6 (six) hours as needed for muscle spasms. 06/15/19   Long, Wonda Olds, MD  triamcinolone cream (KENALOG) 0.1 % Apply 1 application topically 2 (two) times daily. 04/04/19   [place], Nila Nephew D, DO    Allergies    Albuterol, Percocet [oxycodone-acetaminophen], Celecoxib, and Protonix [pantoprazole sodium]  Review of Systems   Review of Systems  Constitutional: Negative for chills and fever.  HENT: Negative for congestion and sore throat.   Respiratory: Positive for shortness of breath. Negative for cough.   Cardiovascular: Negative for chest pain, palpitations and leg swelling.  Gastrointestinal: Negative for diarrhea, nausea and vomiting.  Genitourinary: Negative for difficulty urinating.  Musculoskeletal: Negative for arthralgias and myalgias.  Skin: Negative for wound.  Allergic/Immunologic: Negative for immunocompromised state.  Neurological: Negative for dizziness, weakness and light-headedness.  Psychiatric/Behavioral: Negative for confusion.  All other systems reviewed and are negative.   Physical Exam Updated Vital Signs BP (!) 150/91   Pulse 70   Temp 98.1 F (36.7 C)   Resp 17   Ht 5\' 7"  (1.702 m)   Wt 69.4 kg   SpO2 99%    BMI 23.96 kg/m   Physical Exam Vitals and nursing note reviewed.  Constitutional:      General: She is not in acute distress.    Appearance: She is well-developed. She is not diaphoretic.  HENT:     Head: Normocephalic and atraumatic.  Cardiovascular:     Rate and Rhythm: Normal rate and regular rhythm.     Pulses: Normal pulses.  Pulmonary:     Effort:  Pulmonary effort is normal.     Breath sounds: Normal breath sounds. No decreased breath sounds.  Chest:     Chest wall: No tenderness.  Abdominal:     Palpations: Abdomen is soft.     Tenderness: There is no abdominal tenderness.  Musculoskeletal:     Cervical back: Neck supple.     Right lower leg: No edema.     Left lower leg: No edema.  Skin:    General: Skin is warm and dry.     Findings: No rash.  Neurological:     Mental Status: She is alert and oriented to person, place, and time.  Psychiatric:        Behavior: Behavior normal.     ED Results / Procedures / Treatments   Labs (all labs ordered are listed, but only abnormal results are displayed) Labs Reviewed  NOVEL CORONAVIRUS, NAA (HOSP ORDER, SEND-OUT TO REF LAB; TAT 18-24 HRS)    EKG None  Radiology DG Chest Port 1 View  Result Date: 08/23/2019 CLINICAL DATA:  Shortness of breath for 1 week, periodic chest pain EXAM: PORTABLE CHEST 1 VIEW COMPARISON:  05/20/2019 FINDINGS: Cardiomediastinal contours are stable. Lungs are clear, aside from some linear opacities at the right lung base. Hilar structures are unremarkable. No signs of effusion. Visualized skeletal structures are unremarkable. IMPRESSION: Linear opacities at the right lung base may represent atelectasis or scarring. Otherwise no active cardiopulmonary disease. Electronically Signed   By: Zetta Bills M.D.   On: 08/23/2019 09:39    Procedures Procedures (including critical care time)  Medications Ordered in ED Medications - No data to display  ED Course  I have reviewed the triage vital  signs and the nursing notes.  Pertinent labs & imaging results that were available during my care of the patient were reviewed by me and considered in my medical decision making (see chart for details).  Clinical Course as of Aug 23 951  Thu Dec 31, 588  [id number] 68 year old female with possible history of asthma presents with concern for Covid and requesting testing.  Patient states that she feels short of breath after walking across the room which is consistent with prior asthma exacerbations, not having any symptoms or wheezing today.  Patient found out today that her friend that visited her 1 week ago on Christmas just tested positive for Covid and patient wants to be sure she does not have Covid causing her shortness of breath.  On exam patient is well-appearing, her lungs are clear.  Her vitals are reassuring with SPO2 on room air of 99%.  Chest x-ray with scarring versus atelectasis right lung base.  Patient with outpatient Covid testing sent, advised to quarantine until she knows her test results.   [LM]    Clinical Course User Index [LM] Roque Lias   MDM Rules/Calculators/A&P                      Final Clinical Impression(s) / ED Diagnoses Final diagnoses:  Shortness of breath    Rx / DC Orders ED Discharge Orders    None       Roque Lias 08/23/19 [id number]    Dorie Rank, MD 08/24/19 [phone]

## 2019-08-23 NOTE — Discharge Instructions (Addendum)
Home to quarantine until you know your covid results.  Continue with your regular medications.  Follow up with your doctor.  If you need future COVID testing and are feeling well enough to avoid the ER- you can schedule a test at [email or web address]

## 2019-09-15 ENCOUNTER — Emergency Department (HOSPITAL_COMMUNITY): Payer: Medicare Other

## 2019-09-15 ENCOUNTER — Emergency Department (HOSPITAL_COMMUNITY)
Admission: EM | Admit: 2019-09-15 | Discharge: 2019-09-15 | Disposition: A | Discharge status: Home / Self Care | Payer: Medicare Other | Attending: Emergency Medicine | Admitting: Emergency Medicine

## 2019-09-15 ENCOUNTER — Encounter (HOSPITAL_COMMUNITY): Payer: Self-pay | Admitting: Emergency Medicine

## 2019-09-15 ENCOUNTER — Other Ambulatory Visit: Payer: Self-pay

## 2019-09-15 DIAGNOSIS — R079 Chest pain, unspecified: Secondary | ICD-10-CM | POA: Diagnosis not present

## 2019-09-15 DIAGNOSIS — L309 Dermatitis, unspecified: Secondary | ICD-10-CM | POA: Insufficient documentation

## 2019-09-15 DIAGNOSIS — Z7901 Long term (current) use of anticoagulants: Secondary | ICD-10-CM | POA: Insufficient documentation

## 2019-09-15 DIAGNOSIS — I480 Paroxysmal atrial fibrillation: Secondary | ICD-10-CM | POA: Insufficient documentation

## 2019-09-15 DIAGNOSIS — Z79899 Other long term (current) drug therapy: Secondary | ICD-10-CM | POA: Diagnosis not present

## 2019-09-15 DIAGNOSIS — I10 Essential (primary) hypertension: Secondary | ICD-10-CM | POA: Diagnosis not present

## 2019-09-15 DIAGNOSIS — R0789 Other chest pain: Secondary | ICD-10-CM | POA: Diagnosis not present

## 2019-09-15 DIAGNOSIS — R1013 Epigastric pain: Secondary | ICD-10-CM | POA: Diagnosis present

## 2019-09-15 DIAGNOSIS — R21 Rash and other nonspecific skin eruption: Secondary | ICD-10-CM

## 2019-09-15 DIAGNOSIS — K219 Gastro-esophageal reflux disease without esophagitis: Secondary | ICD-10-CM | POA: Diagnosis not present

## 2019-09-15 LAB — CBC
HCT: 41.3 % (ref 36.0–46.0)
Hemoglobin: 13.3 g/dL (ref 12.0–15.0)
MCH: 26.7 pg (ref 26.0–34.0)
MCHC: 32.2 g/dL (ref 30.0–36.0)
MCV: 82.8 fL (ref 80.0–100.0)
Platelets: 219 K/uL (ref 150–400)
RBC: 4.99 MIL/uL (ref 3.87–5.11)
RDW: 15.9 % — ABNORMAL HIGH (ref 11.5–15.5)
WBC: 3.4 K/uL — ABNORMAL LOW (ref 4.0–10.5)
nRBC: 0 % (ref 0.0–0.2)

## 2019-09-15 LAB — COMPREHENSIVE METABOLIC PANEL
ALT: 40 U/L (ref 0–44)
AST: 37 U/L (ref 15–41)
Albumin: 3.9 g/dL (ref 3.5–5.0)
Alkaline Phosphatase: 99 U/L (ref 38–126)
Anion gap: 9 (ref 5–15)
BUN: 7 mg/dL — ABNORMAL LOW (ref 8–23)
CO2: 26 mmol/L (ref 22–32)
Calcium: 9.8 mg/dL (ref 8.9–10.3)
Chloride: 104 mmol/L (ref 98–111)
Creatinine, Ser: 0.7 mg/dL (ref 0.44–1.00)
GFR calc Af Amer: >60 mL/min (ref >60)
GFR calc non Af Amer: >60 mL/min (ref >60)
Glucose, Bld: 102 mg/dL — ABNORMAL HIGH (ref 70–99)
Potassium: 3.4 mmol/L — ABNORMAL LOW (ref 3.5–5.1)
Sodium: 139 mmol/L (ref 135–145)
Total Bilirubin: 0.4 mg/dL (ref 0.3–1.2)
Total Protein: 7.5 g/dL (ref 6.5–8.1)

## 2019-09-15 LAB — TROPONIN I (HIGH SENSITIVITY)
Troponin I (High Sensitivity): 5 ng/L (ref <18)
Troponin I (High Sensitivity): 5 ng/L

## 2019-09-15 MED ORDER — LIDOCAINE VISCOUS HCL 2 % MT SOLN
15.0000 mL | Freq: Once | OROMUCOSAL | Status: AC
  Administered 2019-09-15: 15 mL via ORAL
  Filled 2019-09-15: qty 15

## 2019-09-15 MED ORDER — ALUM & MAG HYDROXIDE-SIMETH 200-200-20 MG/5ML PO SUSP
30.0000 mL | Freq: Once | ORAL | Status: AC
  Administered 2019-09-15: 09:00:00 30 mL via ORAL
  Filled 2019-09-15: qty 30

## 2019-09-15 MED ORDER — TRIAMCINOLONE ACETONIDE 0.1 % EX CREA: 1.0000 "application " | TOPICAL_CREAM | Freq: Two times a day (BID) | CUTANEOUS | 0 refills | Status: DC

## 2019-09-15 MED ORDER — PANTOPRAZOLE SODIUM 20 MG PO TBEC: 20.0000 mg | DELAYED_RELEASE_TABLET | Freq: Every day | ORAL | 0 refills | Status: DC

## 2019-09-15 NOTE — ED Provider Notes (Signed)
[facility] DEPARTMENT Provider Note   CSN: [id number] Arrival date & time: 09/15/19  [id number]     History Chief Complaint  Patient presents with  . Abdominal Pain  . Leg Pain    [name] is a 69 y.o. female.  69yo female with past medical history of PAF on Xarelto presents with complaint of reflux with abdominal burning, not improving with Pepcid, similar to prior episodes of reflux and states OTC meds never work for this if she needs medicine from a doctor.  Patient reports having burning upper chest and slight regurgitation of food last night, only able to tolerate small bites of applesauce secondary to abdominal burning.  Denies changes in bowel or bladder habits, chest pain, shortness of breath.  Patient also reports having eczema to her lower legs, states she has been applying OTC Vaseline cream for to the area for the past year without significant improvement.  States area is very itchy if she does not apply the cream.  Denies any other complaints or concerns today.        Past Medical History:  Diagnosis Date  . A-fib ([place])   . Allergy    seasonal  . Asthma   . Chronic anticoagulation   . Eczema   . GERD (gastroesophageal reflux disease)   . History of kidney stones   . Hypertension   . PAF (paroxysmal atrial fibrillation) ([place]) 11/26/2015   April/May 2017: Multiple ED visits for A. Fib.  02/12/16: Improved on diltiazem IV. Cardiology consulted in the ED and discharge patient with diltiazem XR 120 mg once daily with 60 mg by mouth every 6 hours as needed for symptomatic palpitations, flecainide 50 mg twice daily. Referred to the atrial fibrillation clinic.  02/17/16: Seen in the atrial fibrillation clinic by NP Roderic Palau. Normal sin    Patient Active Problem List   Diagnosis Date Noted  . Fall 07/31/2019  . Chest pain 07/23/2019  . Gallstones 12/08/2018  . Choledocholithiasis   . Vaginal discharge 10/11/2018  . Vaginal bleeding  10/11/2018  . History of radiofrequency ablation (RFA) procedure for cardiac arrhythmia   . Asthma exacerbation 08/28/2018  . Chronic congestion of paranasal sinus 06/14/2018  . Primary osteoarthritis involving multiple joints 06/14/2018  . Acute pain of left knee 05/15/2018  . Hyperpigmented skin lesion 09/09/2017  . Osteoporosis without current pathological fracture 04/15/2017  . Constipation 10/19/2016  . Normocytic anemia 10/19/2016  . Hyperlipidemia 03/18/2016  . Special screening for malignant neoplasms, colon 03/17/2016  . Cervical cancer screening 03/17/2016  . Assistance with transportation 03/17/2016  . HTN (hypertension), benign   . GERD (gastroesophageal reflux disease)   . PAF (paroxysmal atrial fibrillation) ([place]) 11/26/2015  . Chronic anticoagulation 11/26/2015  . Marijuana abuse 06/11/2015  . Hypokalemia 01/07/2012    Past Surgical History:  Procedure Laterality Date  . ATRIAL FIBRILLATION ABLATION N/A 09/30/2017   Procedure: ATRIAL FIBRILLATION ABLATION;  Surgeon: Thompson Grayer, MD;  Location: [facility] [facility];  Service: Cardiovascular;  Laterality: N/A;  . CHOLECYSTECTOMY N/A 12/08/2018   Procedure: LAPAROSCOPIC CHOLECYSTECTOMY WITH INTRAOPERATIVE CHOLANGIOGRAM;  Surgeon: Jovita Kussmaul, MD;  Location: [facility];  Service: General;  Laterality: N/A;  . ERCP N/A 12/09/2018   Procedure: ENDOSCOPIC RETROGRADE CHOLANGIOPANCREATOGRAPHY (ERCP);  Surgeon: Carol Ada, MD;  Location: [facility];  Service: Endoscopy;  Laterality: N/A;  . SPHINCTEROTOMY  12/09/2018   Procedure: SPHINCTEROTOMY;  Surgeon: Carol Ada, MD;  Location: [facility];  Service: Endoscopy;;  . TONSILLECTOMY    .  TUBAL LIGATION       OB History   No obstetric history on file.     Family History  Problem Relation Age of Onset  . Heart attack Mother        [name] age  . Breast cancer Sister        Late 79s; now s/p mastectomy   . Lung cancer Brother        x 2  . Cancer Sister   . Cancer  Sister   . Cancer Sister   . Colon cancer Neg Hx   . Colon polyps Neg Hx   . Esophageal cancer Neg Hx   . Rectal cancer Neg Hx   . Stomach cancer Neg Hx     Social History   Tobacco Use  . Smoking status: Former Smoker    Quit date: 10/24/2015    Years since quitting: 3.8  . Smokeless tobacco: Never Used  Substance Use Topics  . Alcohol use: Yes  . Drug use: Not Currently    Types: Marijuana    Comment: Smoked marijuana in the past.    Home Medications Prior to Admission medications   Medication Sig Start Date End Date Taking? Authorizing Provider  acetaminophen (TYLENOL) 325 MG tablet Take 650 mg by mouth every 6 (six) hours as needed for mild pain.    [provider]  acetaminophen (TYLENOL) 500 MG tablet Take 2 tablets (1,000 mg total) by mouth every 8 (eight) hours as needed. 05/20/19   Charlesetta Shanks, MD  Capsaicin 0.1 % CREA Apply 1 g topically 3 (three) times daily as needed. 08/01/19   Kathi Ludwig, MD  cetirizine (ZYRTEC ALLERGY) 10 MG tablet Take 1 tablet (10 mg total) by mouth daily. 07/23/19 07/22/20  Jeanmarie Hubert, MD  diltiazem (CARDIZEM) 60 MG tablet Take 1 tablet (60 mg total) by mouth every 6 (six) hours as needed (Rapid AFIB over 100). 07/27/19   Sherran Needs, NP  diltiazem (CARTIA XT) 120 MG 24 hr capsule Take 1 capsule (120 mg total) by mouth 2 (two) times daily. 06/26/19   Axel Filler, MD  diphenhydrAMINE (BENADRYL) 2 % cream Apply topically 3 (three) times daily as needed for itching. Patient not taking: Reported on 05/20/2019 04/04/19   Modena Nunnery D, DO  famotidine (PEPCID) 20 MG tablet Take 1 tablet (20 mg total) by mouth 2 (two) times daily. 05/20/19   Charlesetta Shanks, MD  fluticasone (FLOVENT HFA) 44 MCG/ACT inhaler Inhale 1 puff into the lungs daily. 07/10/19   Axel Filler, MD  HYDROcodone-acetaminophen (NORCO/VICODIN) 5-325 MG tablet Take 1 tablet by mouth every 6 (six) hours as needed. Patient not taking:  Reported on 05/20/2019 12/10/18   Johnathan Hausen, MD  hydrOXYzine (ATARAX/VISTARIL) 25 MG tablet Take 1 tablet (25 mg total) by mouth every 6 (six) hours. Patient not taking: Reported on 05/20/2019 03/02/19   Kinnie Feil, PA-C  levalbuterol [facility] HFA) 45 MCG/ACT inhaler INHALE 1 PUFF INTO THE LUNGS EVERY 6 HOURS AS NEEDED FOR SHORTNESS OF BREATH 07/27/19   Lucious Groves, DO  levalbuterol (XOPENEX) 0.63 MG/3ML nebulizer solution Take 3 mLs (0.63 mg total) by nebulization every 2 (two) hours as needed for wheezing or shortness of breath. 04/07/19   Tillie Fantasia, MD  linaclotide [facility]) 145 MCG CAPS capsule TAKE 1 CAPSULE(145 MCG) BY MOUTH DAILY BEFORE BREAKFAST 06/26/19   Axel Filler, MD  losartan (COZAAR) 50 MG tablet TAKE 1 TABLET(50 MG) BY MOUTH DAILY 07/02/19   Evette Doffing,  Mallie Mussel, MD  magnesium hydroxide (MILK OF MAGNESIA) 400 MG/5ML suspension Take 15-30 mLs by mouth at bedtime as needed for mild constipation. 11/01/17   Harris, Abigail, PA-C  montelukast (SINGULAIR) 10 MG tablet TAKE 1 TABLET(10 MG) BY MOUTH AT BEDTIME Patient taking differently: Take 10 mg by mouth daily as needed (asthma).  01/12/19   [name], MD  omeprazole (PRILOSEC) 20 MG capsule Take 1 capsule (20 mg total) by mouth daily. 07/23/19   Jeanmarie Hubert, MD  ondansetron (ZOFRAN ODT) 4 MG disintegrating tablet Take 1 tablet (4 mg total) by mouth every 8 (eight) hours as needed for nausea or vomiting. 07/23/19   Jeanmarie Hubert, MD  pantoprazole (PROTONIX) 20 MG tablet Take 1 tablet (20 mg total) by mouth daily. 09/15/19 10/15/19  Tacy Learn, PA-C  potassium chloride (KLOR-CON) 10 MEQ tablet Take 2 tablets (20 mEq total) by mouth 2 (two) times daily. 06/26/19 07/26/19  Axel Filler, MD  rivaroxaban (XARELTO) 20 MG TABS tablet Take 1 tablet (20 mg total) by mouth daily with supper. 01/01/19   Sherran Needs, NP  sorbitol 70 % solution Take 15 mLs by mouth daily as needed. Patient not  taking: Reported on 05/20/2019 04/04/19   Modena Nunnery D, DO  sucralfate (CARAFATE) 1 g tablet Take 1 tablet (1 g total) by mouth 4 (four) times daily. Patient not taking: Reported on 05/20/2019 10/16/18 10/16/19  Oval Linsey, MD  tiotropium [facility]) 18 MCG inhalation capsule Place 1 capsule (18 mcg total) into inhaler and inhale daily. 07/27/19   Lucious Groves, DO  tiZANidine (ZANAFLEX) 2 MG tablet Take 1 tablet (2 mg total) by mouth every 6 (six) hours as needed for muscle spasms. 06/15/19   Long, Wonda Olds, MD  triamcinolone cream (KENALOG) 0.1 % Apply 1 application topically 2 (two) times daily. Mix 1:1 with Eucerin 09/15/19   Suella Broad A, PA-C    Allergies    Albuterol, Percocet [oxycodone-acetaminophen], Celecoxib, and Protonix [pantoprazole sodium]  Review of Systems   Review of Systems  Constitutional: Negative for chills, diaphoresis and fever.  Respiratory: Negative for shortness of breath.   Cardiovascular: Negative for chest pain, palpitations and leg swelling.  Gastrointestinal: Positive for abdominal pain. Negative for constipation, diarrhea, nausea and vomiting.  Genitourinary: Negative for dysuria, frequency and urgency.  Musculoskeletal: Negative for arthralgias and myalgias.  Skin: Positive for rash. Negative for wound.  Neurological: Negative for dizziness and weakness.  Hematological: Bruises/bleeds easily.  Psychiatric/Behavioral: Negative for confusion.  All other systems reviewed and are negative.   Physical Exam Updated Vital Signs BP (!) 157/89 (BP Location: Right Arm)   Pulse (!) 58   Temp 97.9 F (36.6 C) (Oral)   Resp 20   Physical Exam Vitals and nursing note reviewed.  Constitutional:      General: She is not in acute distress.    Appearance: She is well-developed. She is not diaphoretic.  HENT:     Head: Normocephalic and atraumatic.  Cardiovascular:     Rate and Rhythm: Normal rate and regular rhythm.     Heart sounds: Normal heart  sounds.  Pulmonary:     Effort: Pulmonary effort is normal.  Abdominal:     Palpations: Abdomen is soft.     Tenderness: There is abdominal tenderness in the epigastric area. There is no right CVA tenderness or left CVA tenderness.     Comments: Very mild epigastric tenderness with palpation.  Skin:    General: Skin is warm and  dry.     Findings: Rash present.     Comments: Right lower extremity with hypopigmentation with small areas of red scale to anterior leg  Neurological:     Mental Status: She is alert and oriented to person, place, and time.  Psychiatric:        Behavior: Behavior normal.     ED Results / Procedures / Treatments   Labs (all labs ordered are listed, but only abnormal results are displayed) Labs Reviewed  CBC - Abnormal; Notable for the following components:      Result Value   WBC 3.4 (*)    RDW 15.9 (*)    All other components within normal limits  COMPREHENSIVE METABOLIC PANEL - Abnormal; Notable for the following components:   Potassium 3.4 (*)    Glucose, Bld 102 (*)    BUN 7 (*)    All other components within normal limits  TROPONIN I (HIGH SENSITIVITY)  TROPONIN I (HIGH SENSITIVITY)    EKG EKG Interpretation  Date/Time:  Saturday September 15 2019 08:58:10 EST Ventricular Rate:  71 PR Interval:    QRS Duration: 96 QT Interval:  412 QTC Calculation: 448 R Axis:   20 Text Interpretation: Sinus rhythm Borderline short PR interval No STEMI Confirmed by Nanda Quinton [id number]) on 09/15/2019 9:38:17 AM   Radiology DG Chest Port 1 View  Result Date: 09/15/2019 CLINICAL DATA:  69 year old female with a history of chest pain EXAM: PORTABLE CHEST 1 VIEW COMPARISON:  09/22/2018 FINDINGS: Cardiomediastinal silhouette unchanged in size and contour. No pneumothorax or pleural effusion. No confluent airspace disease. Coarsened interstitial markings. No displaced fracture. IMPRESSION: Negative for acute cardiopulmonary disease Electronically Signed   By:  Corrie Mckusick D.O.   On: 09/15/2019 09:28    Procedures Procedures (including critical care time)  Medications Ordered in ED Medications  alum & mag hydroxide-simeth (MAALOX/MYLANTA) 200-200-20 MG/5ML suspension 30 mL (30 mLs Oral Given 09/15/19 0844)    And  lidocaine (XYLOCAINE) 2 % viscous mouth solution 15 mL (15 mLs Oral Given 09/15/19 0844)    ED Course  I have reviewed the triage vital signs and the nursing notes.  Pertinent labs & imaging results that were available during my care of the patient were reviewed by me and considered in my medical decision making (see chart for details).  Clinical Course as of Sep 15 1231  Sat Sep 14, 6385  5569 69 year old female presents with complaint of reflux with upper abdominal discomfort for the past week, now with burning up in her chest with regurgitation of food.  Patient is taking Prilosec without improvement in her symptoms.  Secondary concern of eczema to her legs not improving with OTC cream.   [LM]  1229 On exam patient is well-appearing without significant findings, does have very mild tenderness in epigastric area.  She has uncomplicated eczema to the right lower leg without signs of secondary infection.  Patient was given a GI cocktail with improvement in her symptoms.  Troponin x2 -, EKG without acute ischemic changes, CBC and CMP without significant findings, chest x-ray unremarkable.  Patient was given prescription for triamcinolone mixed one-to-one with Eucerin to apply to her legs to help with her eczema.  Plan was to give patient prescription for Protonix however patient reports allergy and states Protonix caused palpitations- upon dc patient states she would like the prescription and is not bothered by the palpitations.  Patient will be referred to GI for possible endoscopy or further management of her reflux.   [  LM]    Clinical Course User Index [LM] Roque Lias   MDM Rules/Calculators/A&P                       Final Clinical Impression(s) / ED Diagnoses Final diagnoses:  Gastroesophageal reflux disease, unspecified whether esophagitis present  Rash    Rx / DC Orders ED Discharge Orders         Ordered    triamcinolone cream (KENALOG) 0.1 %  2 times daily     09/15/19 1220    pantoprazole (PROTONIX) 20 MG tablet  Daily     09/15/19 1232           Tacy Learn, PA-C 09/15/19 1233    Long, Wonda Olds, MD 09/15/19 1921

## 2019-09-15 NOTE — ED Triage Notes (Signed)
C/o generalized abd pain/burning x 1 week.  Pt states she is taking OTC acid reflux meds without relief.  Also reports L leg pain from eczema x 1 year.

## 2019-09-15 NOTE — Discharge Instructions (Addendum)
Follow up with GI for your reflux, given referral. You may need to obtain a referral from your doctor. Review diet instructions for diet, dietary changes may held limit episodes. Apply cream to leg as prescribed.

## 2019-09-17 ENCOUNTER — Telehealth: Payer: Self-pay | Admitting: Internal Medicine

## 2019-09-17 ENCOUNTER — Telehealth: Payer: Self-pay | Admitting: *Deleted

## 2019-09-17 DIAGNOSIS — K219 Gastro-esophageal reflux disease without esophagitis: Secondary | ICD-10-CM

## 2019-09-17 MED ORDER — FAMOTIDINE 40 MG PO TABS: 40.0000 mg | ORAL_TABLET | Freq: Every evening | ORAL | 11 refills | Status: DC

## 2019-09-17 NOTE — Telephone Encounter (Signed)
I called Ms. Novel to clarify what GERD medications she was taking.  Her active medication list had her taking 3 different medications.  The patient's stated that she has not been taking any of them recently and her reflux has gotten worse.  She states that she is allergic to omeprazole.  She states she has not had much success in the past with over-the-counter medications.  After discussing different medications with her, the patient was amendable to trying famotidine. I put in a prescription for famotidine 40 mg daily. We trial this with the patient in follow-up at her next clinic visit.  Earlene Plater, MD Internal Medicine, PGY1 Pager: [phone]  09/17/2019,10:58 AM

## 2019-09-17 NOTE — Telephone Encounter (Signed)
Pt called regarding protonix not being effective for her.  EDCM advised her to call PCP for alternate Rx as the EDP she encountered o her last visit is not available to change Rx at this time.  Pt understood advice and will call PCP promptly.

## 2019-09-17 NOTE — Telephone Encounter (Signed)
Pt need some acid reflux medicine, pt contact [phone]

## 2019-09-18 ENCOUNTER — Telehealth: Payer: Self-pay | Admitting: Internal Medicine

## 2019-09-18 NOTE — Telephone Encounter (Signed)
Pt would like to know the results from COVID test; pls contact [phone]

## 2019-09-19 IMAGING — CR DG CHEST 2V
2 series · 2 of 2 positions shown · non-contrast
Comparison: Chest x-ray dated 02/14/2018.

CLINICAL DATA: Chest pain for 3 days, runny nose and congestion.
History of hypertension and atrial fibrillation.

EXAM:
CHEST - 2 VIEW

[chest pa]
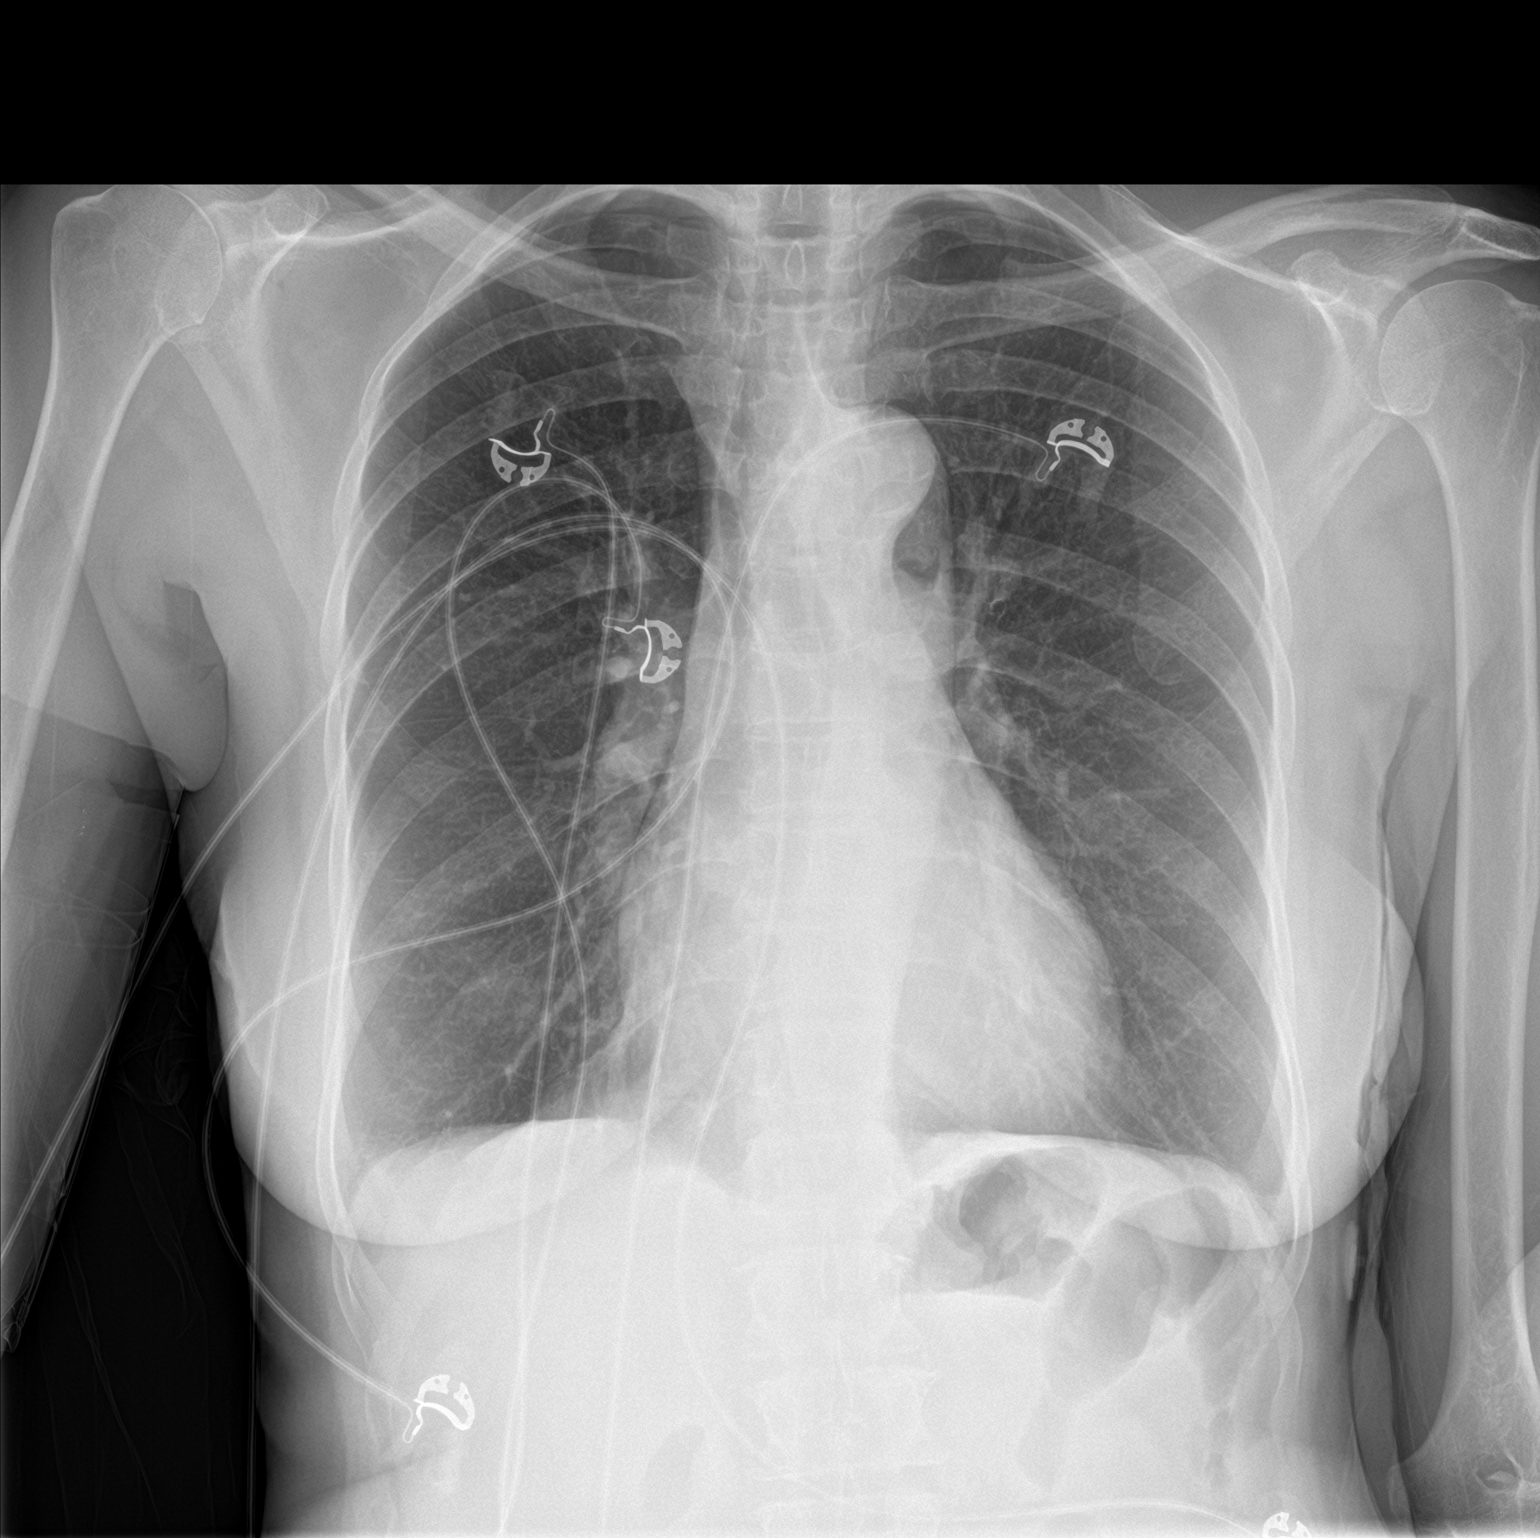

[chest lat]
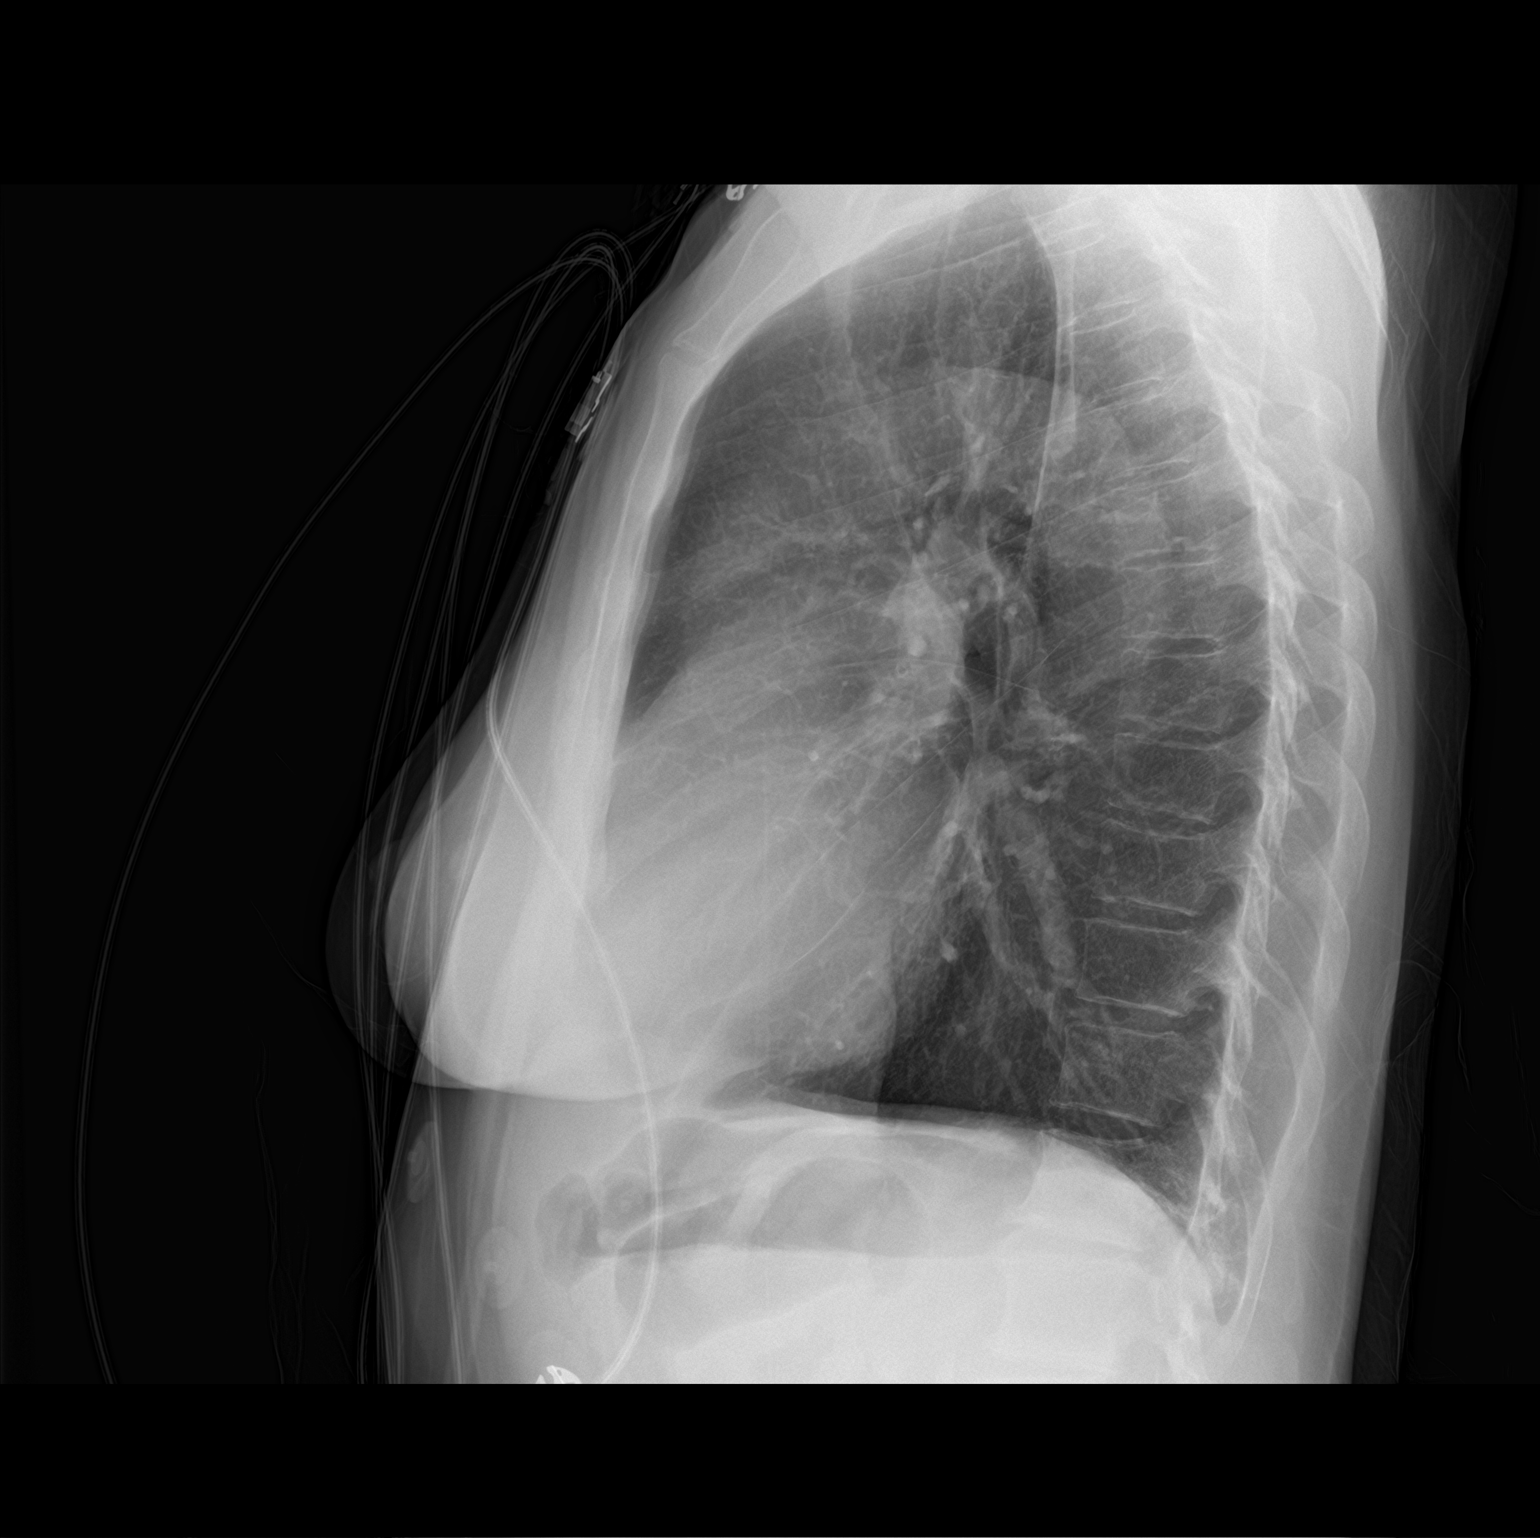

[2 of 2 positions shown; findings below may reference images not displayed]

FINDINGS: Lungs are hyperexpanded. Lungs are clear. No pleural effusion or
pneumothorax seen. Heart size and mediastinal contours are within
normal limits. No acute or suspicious osseous finding.
IMPRESSION: 1. No acute findings.  No evidence of pneumonia or pulmonary edema.
2. Hyperexpanded lungs suggesting COPD.

## 2019-09-22 NOTE — Telephone Encounter (Signed)
I called the patient and went to voicemail. I left a message stating her last COVID test at the end of December was negative, and if she had other questions to feel free to call back.

## 2019-09-25 ENCOUNTER — Other Ambulatory Visit: Payer: Self-pay

## 2019-09-25 ENCOUNTER — Ambulatory Visit (HOSPITAL_COMMUNITY)
Admission: RE | Admit: 2019-09-25 | Discharge: 2019-09-25 | Disposition: A | Discharge status: Home / Self Care | Payer: Medicare Other | Source: Ambulatory Visit | Attending: Nurse Practitioner | Admitting: Nurse Practitioner

## 2019-09-25 ENCOUNTER — Encounter (HOSPITAL_COMMUNITY): Payer: Self-pay | Admitting: Nurse Practitioner

## 2019-09-25 VITALS — BP 126/70 | HR 80 | Ht 67.0 in | Wt 151.4 lb

## 2019-09-25 DIAGNOSIS — Z888 Allergy status to other drugs, medicaments and biological substances status: Secondary | ICD-10-CM | POA: Insufficient documentation

## 2019-09-25 DIAGNOSIS — D6869 Other thrombophilia: Secondary | ICD-10-CM | POA: Diagnosis not present

## 2019-09-25 DIAGNOSIS — J45909 Unspecified asthma, uncomplicated: Secondary | ICD-10-CM | POA: Insufficient documentation

## 2019-09-25 DIAGNOSIS — Z79899 Other long term (current) drug therapy: Secondary | ICD-10-CM | POA: Diagnosis not present

## 2019-09-25 DIAGNOSIS — Z885 Allergy status to narcotic agent status: Secondary | ICD-10-CM | POA: Insufficient documentation

## 2019-09-25 DIAGNOSIS — Z8249 Family history of ischemic heart disease and other diseases of the circulatory system: Secondary | ICD-10-CM | POA: Insufficient documentation

## 2019-09-25 DIAGNOSIS — K219 Gastro-esophageal reflux disease without esophagitis: Secondary | ICD-10-CM | POA: Diagnosis not present

## 2019-09-25 DIAGNOSIS — I480 Paroxysmal atrial fibrillation: Secondary | ICD-10-CM | POA: Insufficient documentation

## 2019-09-25 DIAGNOSIS — Z87891 Personal history of nicotine dependence: Secondary | ICD-10-CM | POA: Insufficient documentation

## 2019-09-25 DIAGNOSIS — I10 Essential (primary) hypertension: Secondary | ICD-10-CM | POA: Insufficient documentation

## 2019-09-25 DIAGNOSIS — Z7901 Long term (current) use of anticoagulants: Secondary | ICD-10-CM | POA: Diagnosis not present

## 2019-09-25 NOTE — Progress Notes (Signed)
Primary Care Physician: Shelby Plater, MD Referring 33 f/u EP: Dr. Rosine Bartlett is a 69 y.o. female with a h/o paroxysmal afib, s/p ablation, 11/2015, asthma, seasonal allergies, that is in the afib clinic for f/u. She feels well, no afib to report. Continues on xarelto 20 mg for a CHA2DS2VASc score of 3.   Today, she denies symptoms of palpitations, chest pain, shortness of breath, orthopnea, PND, lower extremity edema, dizziness, presyncope, syncope, or neurologic sequela. The patient is tolerating medications without difficulties and is otherwise without complaint today.   Past Medical History:  Diagnosis Date  . A-fib ([place])   . Allergy    seasonal  . Asthma   . Chronic anticoagulation   . Eczema   . GERD (gastroesophageal reflux disease)   . History of kidney stones   . Hypertension   . PAF (paroxysmal atrial fibrillation) ([place]) 11/26/2015   April/May 2017: Multiple ED visits for A. Fib.  02/12/16: Improved on diltiazem IV. Cardiology consulted in the ED and discharge patient with diltiazem XR 120 mg once daily with 60 mg by mouth every 6 hours as needed for symptomatic palpitations, flecainide 50 mg twice daily. Referred to the atrial fibrillation clinic.  02/17/16: Seen in the atrial fibrillation clinic by NP [name]. Normal sin   Past Surgical History:  Procedure Laterality Date  . ATRIAL FIBRILLATION ABLATION N/A 09/30/2017   Procedure: ATRIAL FIBRILLATION ABLATION;  Surgeon: Shelby Grayer, MD;  Location: [facility];  Service: Cardiovascular;  Laterality: N/A;  . CHOLECYSTECTOMY N/A 12/08/2018   Procedure: LAPAROSCOPIC CHOLECYSTECTOMY WITH INTRAOPERATIVE CHOLANGIOGRAM;  Surgeon: Shelby Kussmaul, MD;  Location: [facility];  Service: General;  Laterality: N/A;  . ERCP N/A 12/09/2018   Procedure: ENDOSCOPIC RETROGRADE CHOLANGIOPANCREATOGRAPHY (ERCP);  Surgeon: Shelby Ada, MD;  Location: [facility];  Service: Endoscopy;  Laterality: N/A;  .  SPHINCTEROTOMY  12/09/2018   Procedure: SPHINCTEROTOMY;  Surgeon: Shelby Ada, MD;  Location: [facility];  Service: Endoscopy;;  . TONSILLECTOMY    . TUBAL LIGATION      Current Outpatient Medications  Medication Sig Dispense Refill  . acetaminophen (TYLENOL) 325 MG tablet Take 650 mg by mouth every 6 (six) hours as needed for mild pain.    Marland Kitchen acetaminophen (TYLENOL) 500 MG tablet Take 2 tablets (1,000 mg total) by mouth every 8 (eight) hours as needed. 30 tablet 0  . Capsaicin 0.1 % CREA Apply 1 g topically 3 (three) times daily as needed. 60 g 0  . cetirizine (ZYRTEC ALLERGY) 10 MG tablet Take 1 tablet (10 mg total) by mouth daily. 30 tablet 2  . diltiazem (CARDIZEM) 60 MG tablet Take 1 tablet (60 mg total) by mouth every 6 (six) hours as needed (Rapid AFIB over 100). 45 tablet 1  . diltiazem (CARTIA XT) 120 MG 24 hr capsule Take 1 capsule (120 mg total) by mouth 2 (two) times daily. 180 capsule 2  . famotidine (PEPCID) 40 MG tablet Take 1 tablet (40 mg total) by mouth every evening. 30 tablet 11  . fluticasone (FLOVENT HFA) 44 MCG/ACT inhaler Inhale 1 puff into the lungs daily. 3 Inhaler 5  . HYDROcodone-acetaminophen (NORCO/VICODIN) 5-325 MG tablet Take 1 tablet by mouth every 6 (six) hours as needed. 20 tablet 0  . levalbuterol (XOPENEX HFA) 45 MCG/ACT inhaler INHALE 1 PUFF INTO THE LUNGS EVERY 6 HOURS AS NEEDED FOR SHORTNESS OF BREATH 15 g 1  . levalbuterol (XOPENEX) 0.63 MG/3ML nebulizer solution Take 3 mLs (0.63  mg total) by nebulization every 2 (two) hours as needed for wheezing or shortness of breath. 3 mL 12  . linaclotide (LINZESS) 145 MCG CAPS capsule TAKE 1 CAPSULE(145 MCG) BY MOUTH DAILY BEFORE BREAKFAST 90 capsule 1  . losartan (COZAAR) 50 MG tablet TAKE 1 TABLET(50 MG) BY MOUTH DAILY 90 tablet 1  . magnesium hydroxide (MILK OF MAGNESIA) 400 MG/5ML suspension Take 15-30 mLs by mouth at bedtime as needed for mild constipation. 360 mL 0  . montelukast (SINGULAIR) 10 MG tablet  TAKE 1 TABLET(10 MG) BY MOUTH AT BEDTIME (Patient taking differently: Take 10 mg by mouth daily as needed (asthma). ) 90 tablet 1  . ondansetron (ZOFRAN ODT) 4 MG disintegrating tablet Take 1 tablet (4 mg total) by mouth every 8 (eight) hours as needed for nausea or vomiting. 20 tablet 0  . potassium chloride (KLOR-CON) 10 MEQ tablet Take 2 tablets (20 mEq total) by mouth 2 (two) times daily. 180 tablet 1  . rivaroxaban (XARELTO) 20 MG TABS tablet Take 1 tablet (20 mg total) by mouth daily with supper. 30 tablet 6  . tiotropium (SPIRIVA) 18 MCG inhalation capsule Place 1 capsule (18 mcg total) into inhaler and inhale daily. 30 capsule 0  . triamcinolone cream (KENALOG) 0.1 % Apply 1 application topically 2 (two) times daily. Mix 1:1 with Eucerin 453.6 g 0   No current facility-administered medications for this encounter.    Allergies  Allergen Reactions  . Albuterol Palpitations    Caused afib   . Percocet [Oxycodone-Acetaminophen] Shortness Of Breath, Itching and Anxiety    Makes jittery, difficulty breathing, itching  . Celecoxib Rash  . Protonix [Pantoprazole Sodium] Palpitations    Social History   Socioeconomic History  . Marital status: Single    Spouse name: Not on file  . Number of children: Not on file  . Years of education: Not on file  . Highest education level: Not on file  Occupational History  . Occupation: Retired  Tobacco Use  . Smoking status: Former Smoker    Quit date: 10/24/2015    Years since quitting: 3.9  . Smokeless tobacco: Never Used  Substance and Sexual Activity  . Alcohol use: Not Currently  . Drug use: Not Currently    Types: Marijuana    Comment: Smoked marijuana in the past.  . Sexual activity: Yes    Partners: Male    Birth control/protection: Post-menopausal  Other Topics Concern  . Not on file  Social History Narrative   Lives alone in [place], [place] and has 2 daughters who are within driving distance   Enjoys watching soap operas,  reading, exercise   Social Determinants of Health   Financial Resource Strain:   . Difficulty of Paying Living Expenses: Not on file  Food Insecurity:   . Worried About Charity fundraiser in the Last Year: Not on file  . Ran Out of Food in the Last Year: Not on file  Transportation Needs:   . Lack of Transportation (Medical): Not on file  . Lack of Transportation (Non-Medical): Not on file  Physical Activity:   . Days of Exercise per Week: Not on file  . Minutes of Exercise per Session: Not on file  Stress:   . Feeling of Stress : Not on file  Social Connections:   . Frequency of Communication with Friends and Family: Not on file  . Frequency of Social Gatherings with Friends and Family: Not on file  . Attends Religious Services: Not on  file  . Active Member of Clubs or Organizations: Not on file  . Attends Archivist Meetings: Not on file  . Marital Status: Not on file  Intimate Partner Violence:   . Fear of Current or Ex-Partner: Not on file  . Emotionally Abused: Not on file  . Physically Abused: Not on file  . Sexually Abused: Not on file    Family History  Problem Relation Age of Onset  . Heart attack Mother        [name] age  . Breast cancer Sister        Late 64s; now s/p mastectomy   . Lung cancer Brother        x 2  . Cancer Sister   . Cancer Sister   . Cancer Sister   . Colon cancer Neg Hx   . Colon polyps Neg Hx   . Esophageal cancer Neg Hx   . Rectal cancer Neg Hx   . Stomach cancer Neg Hx     ROS- All systems are reviewed and negative except as per the HPI above  Physical Exam: Vitals:   09/25/19 1105  BP: 126/70  Pulse: 80  Weight: 68.7 kg  Height: 5\' 7"  (1.702 m)   Wt Readings from Last 3 Encounters:  09/25/19 68.7 kg  08/23/19 69.4 kg  07/23/19 68.7 kg    Labs: Lab Results  Component Value Date   NA 139 09/15/2019   K 3.4 (L) 09/15/2019   CL 104 09/15/2019   CO2 26 09/15/2019   GLUCOSE 102 (H) 09/15/2019   BUN 7 (L)  09/15/2019   CREATININE 0.70 09/15/2019   CALCIUM 9.8 09/15/2019   PHOS 3.0 04/15/2017   MG 2.1 08/29/2018   Lab Results  Component Value Date   INR 1.0 12/08/2018   Lab Results  Component Value Date   CHOL 245 (H) 03/17/2016   HDL 94 03/17/2016   LDLCALC 132 (H) 03/17/2016   TRIG 95 03/17/2016     GEN- The patient is well appearing, alert and oriented x 3 today.   Head- normocephalic, atraumatic Eyes-  Sclera clear, conjunctiva pink Ears- hearing intact Oropharynx- clear Neck- supple, no JVP Lymph- no cervical lymphadenopathy Lungs- Clear to ausculation bilaterally, normal work of breathing Heart- Regular rate and rhythm, no murmurs, rubs or gallops, PMI not laterally displaced GI- soft, NT, ND, + BS Extremities- no clubbing, cyanosis, or edema MS- no significant deformity or atrophy Skin- no rash or lesion Psych- euthymic mood, full affect Neuro- strength and sensation are intact  EKG-NSR with heart rate of at 80 bpm, pr int 154 bpm, qrs int 84 ms, qtc 445 ms Epic records reviewed    Assessment and Plan: 1. Paroxysmal afib S/p ablation 2017  Maintaining SR  Continue  diltiazem  120 mg bid Continue xarelto 20 mg daily for a CHA2DS2VASc score of 3  F/u here in 6 months  F/u with PCP as scheduled  [name], [facility] [address] [place], [place] [address] [phone]

## 2019-09-26 ENCOUNTER — Ambulatory Visit: Payer: Medicare Other | Admitting: Physician Assistant

## 2019-09-26 ENCOUNTER — Other Ambulatory Visit: Payer: Self-pay | Admitting: *Deleted

## 2019-09-27 ENCOUNTER — Other Ambulatory Visit: Payer: Self-pay | Admitting: Internal Medicine

## 2019-09-27 NOTE — Telephone Encounter (Signed)
Refill request  tiotropium (SPIRIVA) 18 MCG inhalation capsule  [facility] [place], [place]

## 2019-09-27 NOTE — Telephone Encounter (Signed)
Rx request for Spiriva is pending, was sent to provider yesterday afternoon at 3:15. SChaplin, RN,BSN

## 2019-10-01 MED ORDER — TIOTROPIUM BROMIDE MONOHYDRATE 18 MCG IN CAPS: 18.0000 ug | ORAL_CAPSULE | Freq: Every day | RESPIRATORY_TRACT | 0 refills | Status: DC

## 2019-10-03 ENCOUNTER — Ambulatory Visit: Payer: Medicare Other

## 2019-10-03 ENCOUNTER — Other Ambulatory Visit (HOSPITAL_COMMUNITY): Payer: Self-pay | Admitting: *Deleted

## 2019-10-03 MED ORDER — RIVAROXABAN 20 MG PO TABS: 20.0000 mg | ORAL_TABLET | Freq: Every day | ORAL | 6 refills | Status: DC

## 2019-10-04 NOTE — Telephone Encounter (Signed)
spiriva filled 2/8

## 2019-10-10 ENCOUNTER — Other Ambulatory Visit: Payer: Self-pay | Admitting: Internal Medicine

## 2019-10-10 IMAGING — CR DG CHEST 2V
2 series · 2 of 2 positions shown · non-contrast
Comparison: 06/03/2018

CLINICAL DATA: Chest pain off and on for a while worse last night,
states she has been going in and out of atrial fibrillation since
1141 hours. History hypertension, former smoker, asthma, GERD

EXAM:
CHEST - 2 VIEW

[chest pa]
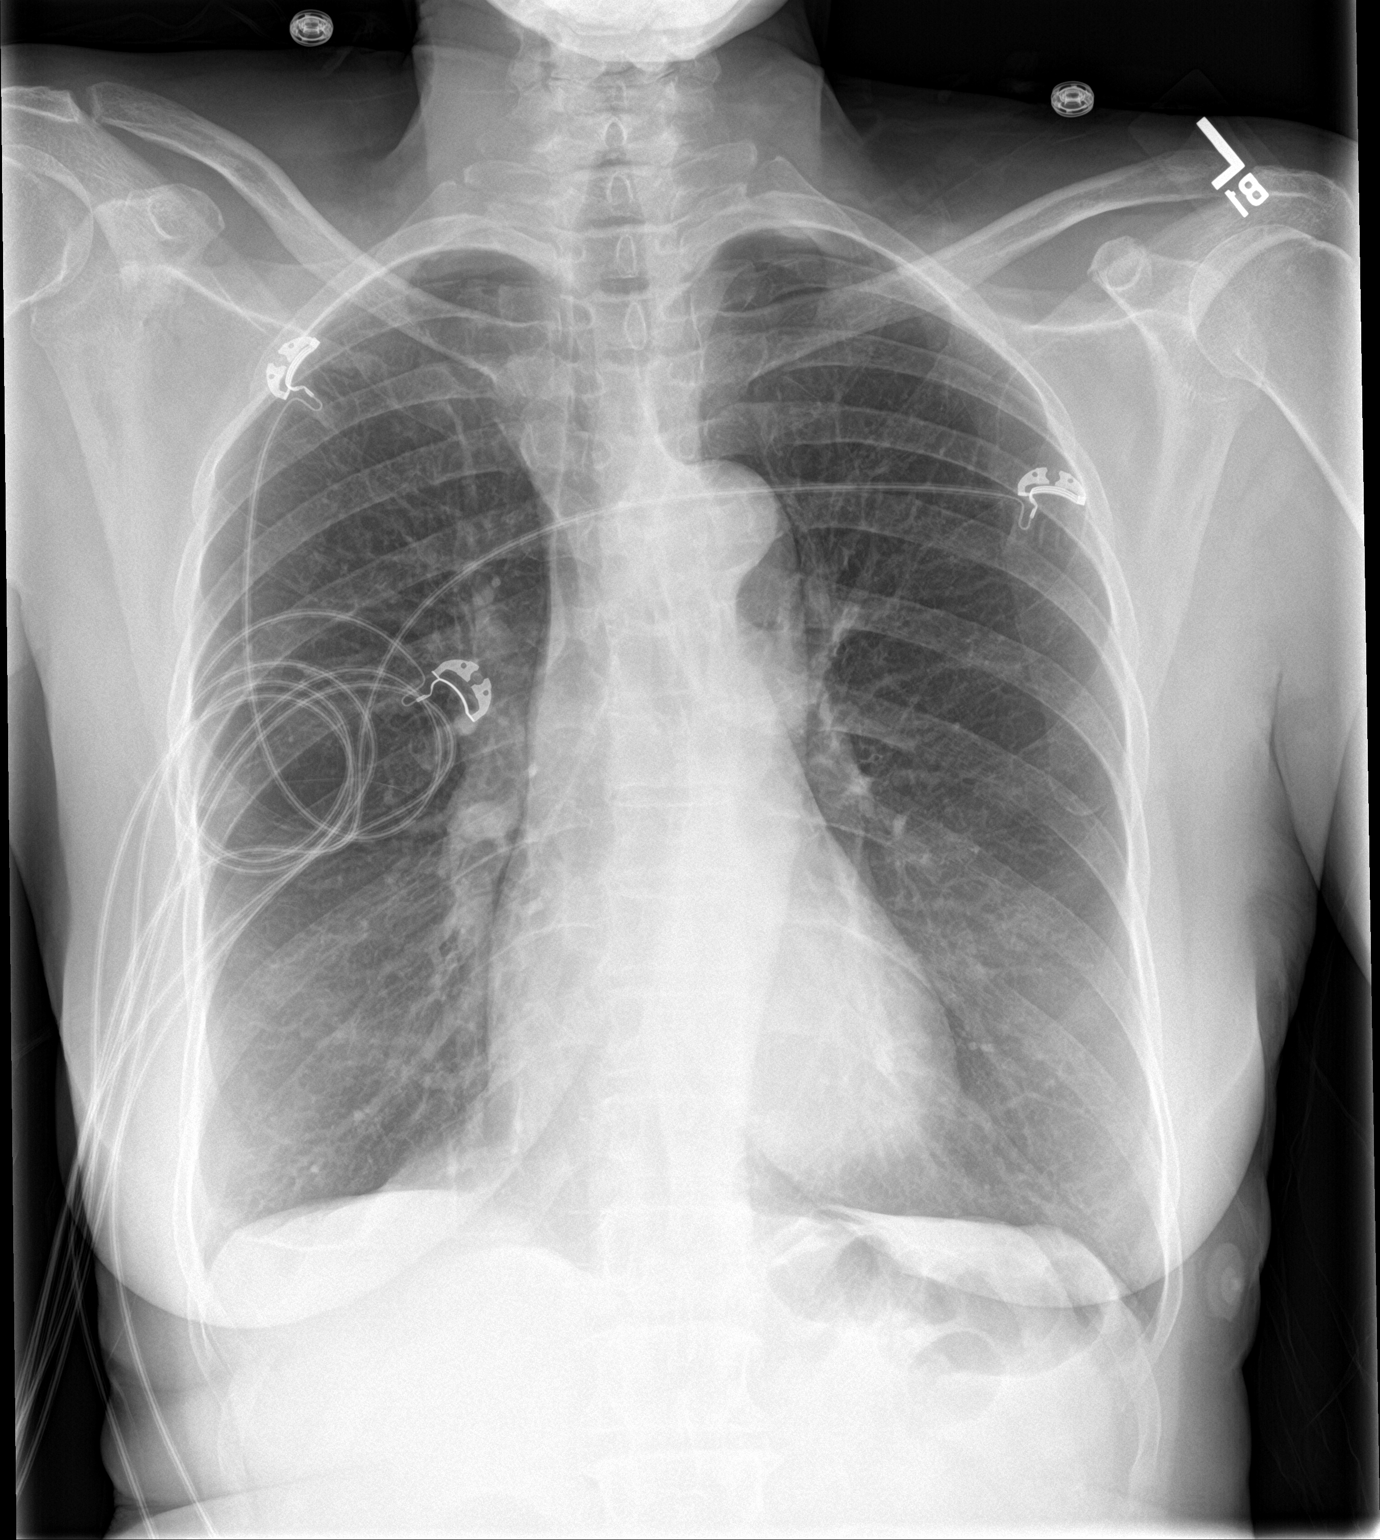

[chest lat]
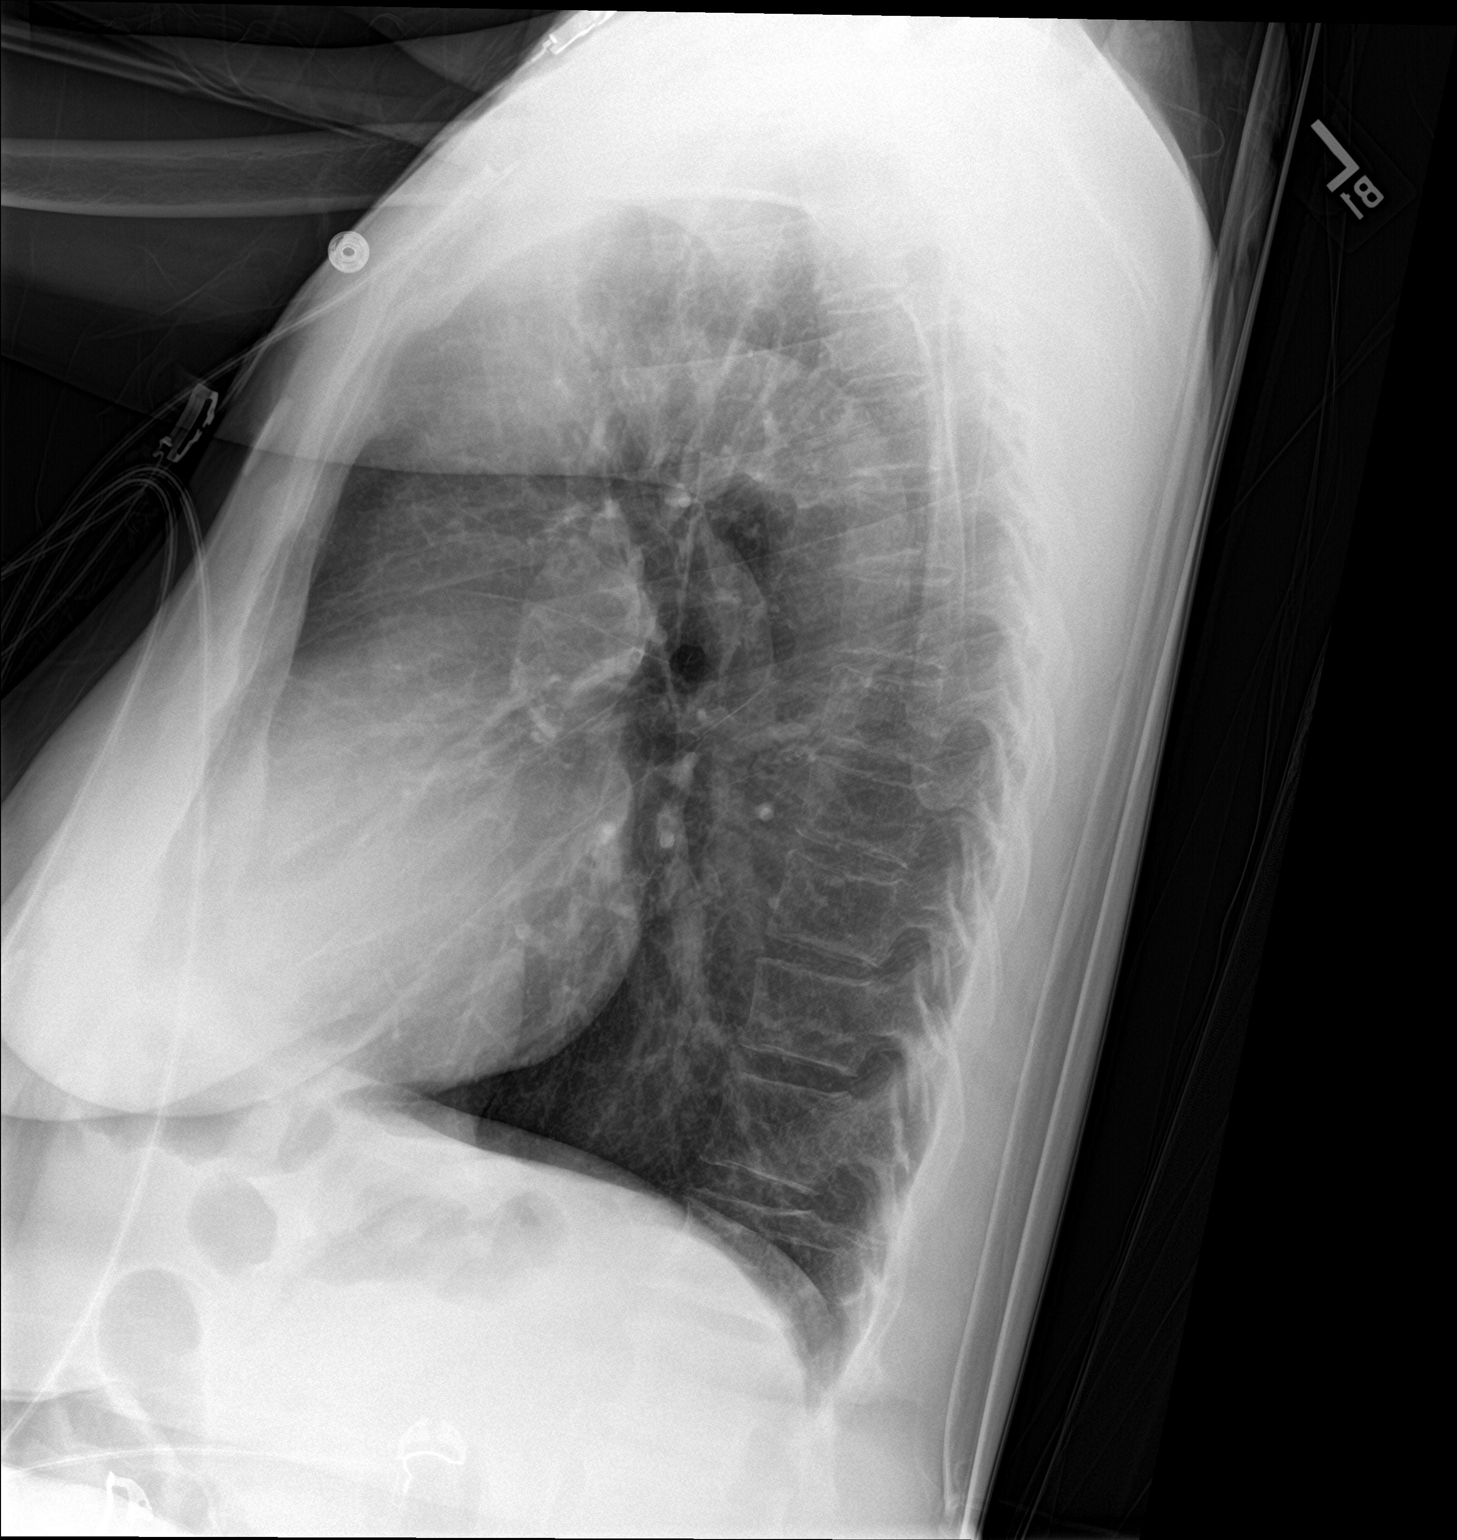

[2 of 2 positions shown; findings below may reference images not displayed]

FINDINGS: Normal heart size, mediastinal contours, and pulmonary vascularity.

Lungs mildly hyperinflated but clear.

No infiltrate, pleural effusion or pneumothorax.

Atherosclerotic calcification aorta.

No acute osseous findings.
IMPRESSION: Mildly hyperinflated lungs without acute infiltrate.

## 2019-10-10 MED ORDER — MONTELUKAST SODIUM 10 MG PO TABS: ORAL_TABLET | ORAL | 1 refills | Status: DC

## 2019-10-10 MED ORDER — LOSARTAN POTASSIUM 50 MG PO TABS: ORAL_TABLET | ORAL | 1 refills | Status: DC

## 2019-10-10 NOTE — Telephone Encounter (Signed)
Need refill on losartan (COZAAR) 50 MG tablet montelukast (SINGULAIR) 10 MG tablet   ;pt contact [place], [place] AT [place]

## 2019-10-25 ENCOUNTER — Emergency Department (HOSPITAL_COMMUNITY): Payer: Medicare Other

## 2019-10-25 ENCOUNTER — Emergency Department (HOSPITAL_COMMUNITY)
Admission: EM | Admit: 2019-10-25 | Discharge: 2019-10-25 | Disposition: A | Discharge status: Home / Self Care | Payer: Medicare Other | Attending: Emergency Medicine | Admitting: Emergency Medicine

## 2019-10-25 ENCOUNTER — Encounter (HOSPITAL_COMMUNITY): Payer: Self-pay

## 2019-10-25 DIAGNOSIS — Z87891 Personal history of nicotine dependence: Secondary | ICD-10-CM | POA: Insufficient documentation

## 2019-10-25 DIAGNOSIS — I10 Essential (primary) hypertension: Secondary | ICD-10-CM | POA: Diagnosis not present

## 2019-10-25 DIAGNOSIS — R109 Unspecified abdominal pain: Secondary | ICD-10-CM | POA: Insufficient documentation

## 2019-10-25 DIAGNOSIS — J069 Acute upper respiratory infection, unspecified: Secondary | ICD-10-CM | POA: Diagnosis not present

## 2019-10-25 DIAGNOSIS — R918 Other nonspecific abnormal finding of lung field: Secondary | ICD-10-CM | POA: Diagnosis not present

## 2019-10-25 DIAGNOSIS — Z79899 Other long term (current) drug therapy: Secondary | ICD-10-CM | POA: Diagnosis not present

## 2019-10-25 DIAGNOSIS — Z20822 Contact with and (suspected) exposure to covid-19: Secondary | ICD-10-CM | POA: Diagnosis not present

## 2019-10-25 DIAGNOSIS — R1031 Right lower quadrant pain: Secondary | ICD-10-CM | POA: Diagnosis not present

## 2019-10-25 DIAGNOSIS — R0600 Dyspnea, unspecified: Secondary | ICD-10-CM | POA: Diagnosis not present

## 2019-10-25 DIAGNOSIS — J45909 Unspecified asthma, uncomplicated: Secondary | ICD-10-CM | POA: Insufficient documentation

## 2019-10-25 DIAGNOSIS — R0602 Shortness of breath: Secondary | ICD-10-CM | POA: Diagnosis not present

## 2019-10-25 LAB — CBC
HCT: 38.1 % (ref 36.0–46.0)
Hemoglobin: 12.2 g/dL (ref 12.0–15.0)
MCH: 26.5 pg (ref 26.0–34.0)
MCHC: 32 g/dL (ref 30.0–36.0)
MCV: 82.8 fL (ref 80.0–100.0)
Platelets: 232 10*3/uL (ref 150–400)
RBC: 4.6 MIL/uL (ref 3.87–5.11)
RDW: 15.7 % — ABNORMAL HIGH (ref 11.5–15.5)
WBC: 4.5 10*3/uL (ref 4.0–10.5)
nRBC: 0 % (ref 0.0–0.2)

## 2019-10-25 LAB — URINALYSIS, ROUTINE W REFLEX MICROSCOPIC
Bilirubin Urine: NEGATIVE
Glucose, UA: NEGATIVE mg/dL
Ketones, ur: NEGATIVE mg/dL
Leukocytes,Ua: NEGATIVE
Nitrite: NEGATIVE
Protein, ur: NEGATIVE mg/dL
Specific Gravity, Urine: 1.011 (ref 1.005–1.030)
pH: 6 (ref 5.0–8.0)

## 2019-10-25 LAB — COMPREHENSIVE METABOLIC PANEL
ALT: 21 U/L (ref 0–44)
AST: 19 U/L (ref 15–41)
Albumin: 3.6 g/dL (ref 3.5–5.0)
Alkaline Phosphatase: 94 U/L (ref 38–126)
Anion gap: 10 (ref 5–15)
BUN: 8 mg/dL (ref 8–23)
CO2: 24 mmol/L (ref 22–32)
Calcium: 9.5 mg/dL (ref 8.9–10.3)
Chloride: 104 mmol/L (ref 98–111)
Creatinine, Ser: 0.79 mg/dL (ref 0.44–1.00)
GFR calc Af Amer: >60 mL/min (ref >60)
GFR calc non Af Amer: >60 mL/min (ref >60)
Glucose, Bld: 97 mg/dL (ref 70–99)
Potassium: 3.3 mmol/L — ABNORMAL LOW (ref 3.5–5.1)
Sodium: 138 mmol/L (ref 135–145)
Total Bilirubin: 0.7 mg/dL (ref 0.3–1.2)
Total Protein: 7.1 g/dL (ref 6.5–8.1)

## 2019-10-25 LAB — SARS CORONAVIRUS 2 (TAT 6-24 HRS): SARS Coronavirus 2: NEGATIVE

## 2019-10-25 LAB — LIPASE, BLOOD: Lipase: 21 U/L (ref 11–51)

## 2019-10-25 MED ORDER — HYDROCODONE-ACETAMINOPHEN 5-325 MG PO TABS: 1.0000 | ORAL_TABLET | Freq: Four times a day (QID) | ORAL | 0 refills | Status: DC | PRN

## 2019-10-25 MED ORDER — HYDROCODONE-ACETAMINOPHEN 5-325 MG PO TABS
1.0000 | ORAL_TABLET | Freq: Once | ORAL | Status: AC
  Administered 2019-10-25: 1 via ORAL
  Filled 2019-10-25: qty 1

## 2019-10-25 MED ORDER — GUAIFENESIN-DM 100-10 MG/5ML PO SYRP: 5.0000 mL | ORAL_SOLUTION | Freq: Three times a day (TID) | ORAL | 0 refills | Status: DC | PRN

## 2019-10-25 NOTE — ED Provider Notes (Signed)
[facility] EMERGENCY DEPARTMENT Provider Note   CSN: [id number] Arrival date & time: 10/25/19  0754     History Chief Complaint  Patient presents with  . Shortness of Breath  . Nasal Congestion  . Flank Pain    [name] is a 69 y.o. female.  HPI Patient presents with congestion cough shortness of breath and right flank pain.  Began a few days ago.  States she felt more short of breath when she woke up.  States there was a lot of congestion.  Feeling somewhat better now.  No dysuria.  Has had kidney infection before.  No sick contacts.  Is on anticoagulation for paroxysmal A. fib but appears to be in sinus rhythm since ablation.  Has had a previous cholecystectomy.    No rash.  Pain is sharp.  Past Medical History:  Diagnosis Date  . A-fib ([place])   . Allergy    seasonal  . Asthma   . Chronic anticoagulation   . Eczema   . GERD (gastroesophageal reflux disease)   . History of kidney stones   . Hypertension   . PAF (paroxysmal atrial fibrillation) ([place]) 11/26/2015   April/May 2017: Multiple ED visits for A. Fib.  02/12/16: Improved on diltiazem IV. Cardiology consulted in the ED and discharge patient with diltiazem XR 120 mg once daily with 60 mg by mouth every 6 hours as needed for symptomatic palpitations, flecainide 50 mg twice daily. Referred to the atrial fibrillation clinic.  02/17/16: Seen in the atrial fibrillation clinic by NP Roderic Palau. Normal sin    Patient Active Problem List   Diagnosis Date Noted  . Fall 07/31/2019  . Chest pain 07/23/2019  . Gallstones 12/08/2018  . Choledocholithiasis   . Vaginal discharge 10/11/2018  . Vaginal bleeding 10/11/2018  . History of radiofrequency ablation (RFA) procedure for cardiac arrhythmia   . Asthma exacerbation 08/28/2018  . Chronic congestion of paranasal sinus 06/14/2018  . Primary osteoarthritis involving multiple joints 06/14/2018  . Acute pain of left knee 05/15/2018  . Hyperpigmented skin  lesion 09/09/2017  . Osteoporosis without current pathological fracture 04/15/2017  . Constipation 10/19/2016  . Normocytic anemia 10/19/2016  . Hyperlipidemia 03/18/2016  . Special screening for malignant neoplasms, colon 03/17/2016  . Cervical cancer screening 03/17/2016  . Assistance with transportation 03/17/2016  . HTN (hypertension), benign   . GERD (gastroesophageal reflux disease)   . PAF (paroxysmal atrial fibrillation) ([place]) 11/26/2015  . Chronic anticoagulation 11/26/2015  . Marijuana abuse 06/11/2015  . Hypokalemia 01/07/2012    Past Surgical History:  Procedure Laterality Date  . ATRIAL FIBRILLATION ABLATION N/A 09/30/2017   Procedure: ATRIAL FIBRILLATION ABLATION;  Surgeon: Thompson Grayer, MD;  Location: [facility];  Service: Cardiovascular;  Laterality: N/A;  . CHOLECYSTECTOMY N/A 12/08/2018   Procedure: LAPAROSCOPIC CHOLECYSTECTOMY WITH INTRAOPERATIVE CHOLANGIOGRAM;  Surgeon: Jovita Kussmaul, MD;  Location: [facility];  Service: General;  Laterality: N/A;  . ERCP N/A 12/09/2018   Procedure: ENDOSCOPIC RETROGRADE CHOLANGIOPANCREATOGRAPHY (ERCP);  Surgeon: Carol Ada, MD;  Location: [facility];  Service: Endoscopy;  Laterality: N/A;  . SPHINCTEROTOMY  12/09/2018   Procedure: SPHINCTEROTOMY;  Surgeon: Carol Ada, MD;  Location: [facility];  Service: Endoscopy;;  . TONSILLECTOMY    . TUBAL LIGATION       OB History   No obstetric history on file.     Family History  Problem Relation Age of Onset  . Heart attack Mother  Young age  . Breast cancer Sister        Late 24s; now s/p mastectomy   . Lung cancer Brother        x 2  . Cancer Sister   . Cancer Sister   . Cancer Sister   . Colon cancer Neg Hx   . Colon polyps Neg Hx   . Esophageal cancer Neg Hx   . Rectal cancer Neg Hx   . Stomach cancer Neg Hx     Social History   Tobacco Use  . Smoking status: Former Smoker    Quit date: 10/24/2015    Years since quitting: 4.0  . Smokeless  tobacco: Never Used  Substance Use Topics  . Alcohol use: Not Currently  . Drug use: Not Currently    Types: Marijuana    Comment: Smoked marijuana in the past.    Home Medications Prior to Admission medications   Medication Sig Start Date End Date Taking? Authorizing Provider  acetaminophen (TYLENOL) 325 MG tablet Take 650 mg by mouth every 6 (six) hours as needed for mild pain.    [provider]  acetaminophen (TYLENOL) 500 MG tablet Take 2 tablets (1,000 mg total) by mouth every 8 (eight) hours as needed. 05/20/19   Charlesetta Shanks, MD  Capsaicin 0.1 % CREA Apply 1 g topically 3 (three) times daily as needed. 08/01/19   Kathi Ludwig, MD  cetirizine (ZYRTEC ALLERGY) 10 MG tablet Take 1 tablet (10 mg total) by mouth daily. 07/23/19 07/22/20  Jeanmarie Hubert, MD  diltiazem (CARDIZEM) 60 MG tablet Take 1 tablet (60 mg total) by mouth every 6 (six) hours as needed (Rapid AFIB over 100). 07/27/19   Sherran Needs, NP  diltiazem (CARTIA XT) 120 MG 24 hr capsule Take 1 capsule (120 mg total) by mouth 2 (two) times daily. 06/26/19   Axel Filler, MD  famotidine (PEPCID) 40 MG tablet Take 1 tablet (40 mg total) by mouth every evening. 09/17/19 09/16/20  Earlene Plater, MD  fluticasone (FLOVENT HFA) 44 MCG/ACT inhaler Inhale 1 puff into the lungs daily. 07/10/19   Axel Filler, MD  guaiFENesin-dextromethorphan (ROBITUSSIN DM) 100-10 MG/5ML syrup Take 5 mLs by mouth 3 (three) times daily as needed for cough. 10/25/19   Davonna Belling, MD  HYDROcodone-acetaminophen (NORCO/VICODIN) 5-325 MG tablet Take 1-2 tablets by mouth every 6 (six) hours as needed. 10/25/19   Davonna Belling, MD  levalbuterol [facility] HFA) 45 MCG/ACT inhaler INHALE 1 PUFF INTO THE LUNGS EVERY 6 HOURS AS NEEDED FOR SHORTNESS OF BREATH 07/27/19   Lucious Groves, DO  levalbuterol (XOPENEX) 0.63 MG/3ML nebulizer solution Take 3 mLs (0.63 mg total) by nebulization every 2 (two) hours as needed for  wheezing or shortness of breath. 04/07/19   Tillie Fantasia, MD  linaclotide [facility]) 145 MCG CAPS capsule TAKE 1 CAPSULE(145 MCG) BY MOUTH DAILY BEFORE BREAKFAST 06/26/19   Axel Filler, MD  losartan (COZAAR) 50 MG tablet TAKE 1 TABLET(50 MG) BY MOUTH DAILY 10/10/19   Earlene Plater, MD  magnesium hydroxide (MILK OF MAGNESIA) 400 MG/5ML suspension Take 15-30 mLs by mouth at bedtime as needed for mild constipation. 11/01/17   Harris, Abigail, PA-C  montelukast (SINGULAIR) 10 MG tablet TAKE 1 TABLET(10 MG) BY MOUTH AT BEDTIME 10/10/19   Earlene Plater, MD  ondansetron (ZOFRAN ODT) 4 MG disintegrating tablet Take 1 tablet (4 mg total) by mouth every 8 (eight) hours as needed for nausea or vomiting. 07/23/19   Jeanmarie Hubert, MD  potassium chloride (KLOR-CON) 10 MEQ tablet Take 2 tablets (20 mEq total) by mouth 2 (two) times daily. 06/26/19 09/25/19  Axel Filler, MD  rivaroxaban (XARELTO) 20 MG TABS tablet Take 1 tablet (20 mg total) by mouth daily with supper. 10/03/19   Sherran Needs, NP  tiotropium (SPIRIVA) 18 MCG inhalation capsule Place 1 capsule (18 mcg total) into inhaler and inhale daily. 10/01/19   Earlene Plater, MD  triamcinolone cream (KENALOG) 0.1 % Apply 1 application topically 2 (two) times daily. Mix 1:1 with Eucerin 09/15/19   Tacy Learn, PA-C    Allergies    Albuterol, Percocet [oxycodone-acetaminophen], Celecoxib, and Protonix [pantoprazole sodium]  Review of Systems   Review of Systems  Constitutional: Negative for appetite change.  HENT: Negative for congestion.   Respiratory: Positive for cough and shortness of breath.   Cardiovascular: Negative for chest pain.  Gastrointestinal: Negative for abdominal distention.  Genitourinary: Positive for flank pain.  Musculoskeletal: Negative for back pain.  Skin: Negative for rash.  Neurological: Negative for weakness.  Psychiatric/Behavioral: Negative for confusion.    Physical Exam Updated Vital  Signs BP (!) 152/78 (BP Location: Right Arm)   Pulse 86   Temp 98.8 F (37.1 C) (Oral)   Resp 15   SpO2 98%   Physical Exam Vitals and nursing note reviewed.  HENT:     Head: Normocephalic.  Cardiovascular:     Rate and Rhythm: Normal rate.  Pulmonary:     Breath sounds: Normal breath sounds.  Chest:     Chest wall: Tenderness present.  Abdominal:     Comments: Mild right upper abdominal tenderness without rebound or guarding  Genitourinary:    Comments: Mild CVA tenderness/lower rib tenderness. Musculoskeletal:     Right lower leg: No edema.     Left lower leg: No edema.  Skin:    General: Skin is warm.  Neurological:     General: No focal deficit present.     Mental Status: She is alert.     ED Results / Procedures / Treatments   Labs (all labs ordered are listed, but only abnormal results are displayed) Labs Reviewed  COMPREHENSIVE METABOLIC PANEL - Abnormal; Notable for the following components:      Result Value   Potassium 3.3 (*)    All other components within normal limits  CBC - Abnormal; Notable for the following components:   RDW 15.7 (*)    All other components within normal limits  URINALYSIS, ROUTINE W REFLEX MICROSCOPIC - Abnormal; Notable for the following components:   APPearance HAZY (*)    Hgb urine dipstick MODERATE (*)    Bacteria, UA FEW (*)    All other components within normal limits  SARS CORONAVIRUS 2 (TAT 6-24 HRS)  LIPASE, BLOOD    EKG EKG Interpretation  Date/Time:  Thursday October 25 2019 08:02:31 EST Ventricular Rate:  86 PR Interval:  120 QRS Duration: 84 QT Interval:  370 QTC Calculation: 442 R Axis:   69 Text Interpretation: Normal sinus rhythm Normal ECG Confirmed by Davonna Belling [phone]) on 10/25/2019 12:01:53 PM   Radiology DG Chest 2 View  Result Date: 10/25/2019 CLINICAL DATA:  Dyspnea EXAM: CHEST - 2 VIEW COMPARISON:  09/15/2019 chest radiograph. FINDINGS: Stable cardiomediastinal silhouette with normal heart  size. No pneumothorax. No pleural effusion. Mildly hyperinflated lungs. No pulmonary edema. No acute consolidative airspace disease. IMPRESSION: 1. Mildly hyperinflated lungs, suggesting obstructive lung disease. 2. Otherwise no active cardiopulmonary disease. Electronically Signed  By: Ilona Sorrel M.D.   On: 10/25/2019 08:57   US Renal  Result Date: 10/25/2019 CLINICAL DATA:  Right flank pain EXAM: RENAL / URINARY TRACT ULTRASOUND COMPLETE COMPARISON:  11/27/2018 FINDINGS: Right Kidney: Renal measurements: 10.6 x 5.9 x 3.9 cm = volume: 121 mL . Echogenicity within normal limits. No mass or hydronephrosis visualized. Left Kidney: Renal measurements: 11.8 x 5.7 x 5.0 cm = volume: 174 mL. Echogenicity within normal limits. No mass or hydronephrosis visualized. Bladder: Appears normal for degree of bladder distention. Other: None. IMPRESSION: Normal study.  No hydronephrosis. Electronically Signed   By: Rolm Baptise M.D.   On: 10/25/2019 11:25    Procedures Procedures (including critical care time)  Medications Ordered in ED Medications  HYDROcodone-acetaminophen (NORCO/VICODIN) 5-325 MG per tablet 1 tablet (1 tablet Oral Given 10/25/19 0946)    ED Course  I have reviewed the triage vital signs and the nursing notes.  Pertinent labs & imaging results that were available during my care of the patient were reviewed by me and considered in my medical decision making (see chart for details).    MDM Rules/Calculators/A&P                      Patient with right chest and flank pain.  Cough.  X-ray reassuring.  Outpatient Covid test done.  On anticoagulation.  Urine showed blood.  Ultrasound done due to previous kidney stone.  No hydronephrosis.  Discharge home with symptomatic treatment and outpatient follow-up Final Clinical Impression(s) / ED Diagnoses Final diagnoses:  Upper respiratory tract infection, unspecified type  Flank pain    Rx / DC Orders ED Discharge Orders         Ordered     guaiFENesin-dextromethorphan (ROBITUSSIN DM) 100-10 MG/5ML syrup  3 times daily PRN     10/25/19 1204    HYDROcodone-acetaminophen (NORCO/VICODIN) 5-325 MG tablet  Every 6 hours PRN     10/25/19 1204           Davonna Belling, MD 10/25/19 1516

## 2019-10-25 NOTE — Discharge Instructions (Signed)
You have a Covid test that has been done. You will be notified if the results are positive. Follow-up with your doctor for the pain and the blood in the urine.

## 2019-10-25 NOTE — ED Triage Notes (Signed)
Pt reports congestion, sob and right side flank pain. States she has a lot of phlegm in her throat. resp e.u

## 2019-10-26 ENCOUNTER — Emergency Department (HOSPITAL_COMMUNITY)
Admission: EM | Admit: 2019-10-26 | Discharge: 2019-10-26 | Disposition: A | Discharge status: Home / Self Care | Payer: Medicare Other | Attending: Emergency Medicine | Admitting: Emergency Medicine

## 2019-10-26 ENCOUNTER — Other Ambulatory Visit: Payer: Self-pay

## 2019-10-26 ENCOUNTER — Encounter (HOSPITAL_COMMUNITY): Payer: Self-pay | Admitting: *Deleted

## 2019-10-26 DIAGNOSIS — R002 Palpitations: Secondary | ICD-10-CM | POA: Diagnosis not present

## 2019-10-26 DIAGNOSIS — Z5321 Procedure and treatment not carried out due to patient leaving prior to being seen by health care provider: Secondary | ICD-10-CM | POA: Insufficient documentation

## 2019-10-26 LAB — CBC
HCT: 40 % (ref 36.0–46.0)
Hemoglobin: 12.9 g/dL (ref 12.0–15.0)
MCH: 26.7 pg (ref 26.0–34.0)
MCHC: 32.3 g/dL (ref 30.0–36.0)
MCV: 82.6 fL (ref 80.0–100.0)
Platelets: 219 10*3/uL (ref 150–400)
RBC: 4.84 MIL/uL (ref 3.87–5.11)
RDW: 15.3 % (ref 11.5–15.5)
WBC: 4.6 10*3/uL (ref 4.0–10.5)
nRBC: 0 % (ref 0.0–0.2)

## 2019-10-26 LAB — BASIC METABOLIC PANEL
Anion gap: 11 (ref 5–15)
BUN: 8 mg/dL (ref 8–23)
CO2: 22 mmol/L (ref 22–32)
Calcium: 9.7 mg/dL (ref 8.9–10.3)
Chloride: 107 mmol/L (ref 98–111)
Creatinine, Ser: 0.67 mg/dL (ref 0.44–1.00)
GFR calc Af Amer: >60 mL/min (ref >60)
GFR calc non Af Amer: >60 mL/min (ref >60)
Glucose, Bld: 136 mg/dL — ABNORMAL HIGH (ref 70–99)
Potassium: 3.3 mmol/L — ABNORMAL LOW (ref 3.5–5.1)
Sodium: 140 mmol/L (ref 135–145)

## 2019-10-26 MED ORDER — SODIUM CHLORIDE 0.9% FLUSH: 3.0000 mL | Freq: Once | INTRAVENOUS | Status: DC

## 2019-10-26 NOTE — ED Notes (Signed)
Pt called hospital to notify staff that she had left and will go to her pcp today

## 2019-10-26 NOTE — ED Notes (Signed)
Called multiple times for room, no answer  

## 2019-10-26 NOTE — ED Triage Notes (Signed)
States she woke up at 4am with heart fluttering states she took her medication however it didn't slow low.

## 2019-10-27 ENCOUNTER — Telehealth: Payer: Self-pay | Admitting: General Practice

## 2019-10-27 NOTE — Telephone Encounter (Signed)
Negative COVID results given. Patient results "NOT Detected." Caller expressed understanding. ° °

## 2019-10-29 ENCOUNTER — Other Ambulatory Visit: Payer: Self-pay | Admitting: Internal Medicine

## 2019-10-29 MED ORDER — TIOTROPIUM BROMIDE MONOHYDRATE 18 MCG IN CAPS: 18.0000 ug | ORAL_CAPSULE | Freq: Every day | RESPIRATORY_TRACT | 1 refills | Status: DC

## 2019-10-29 NOTE — Telephone Encounter (Signed)
Needs refill on tiotropium (SPIRIVA) 18 MCG inhalation capsule pantoprazole 20mg   ;pt contact [place], [place] AT [place]

## 2019-11-03 ENCOUNTER — Other Ambulatory Visit: Payer: Self-pay

## 2019-11-03 ENCOUNTER — Emergency Department (HOSPITAL_COMMUNITY)
Admission: EM | Admit: 2019-11-03 | Discharge: 2019-11-03 | Disposition: A | Discharge status: Home / Self Care | Payer: Medicare Other | Attending: Emergency Medicine | Admitting: Emergency Medicine

## 2019-11-03 ENCOUNTER — Emergency Department (HOSPITAL_COMMUNITY): Payer: Medicare Other

## 2019-11-03 ENCOUNTER — Encounter (HOSPITAL_COMMUNITY): Payer: Self-pay | Admitting: Emergency Medicine

## 2019-11-03 DIAGNOSIS — J45909 Unspecified asthma, uncomplicated: Secondary | ICD-10-CM | POA: Insufficient documentation

## 2019-11-03 DIAGNOSIS — R002 Palpitations: Secondary | ICD-10-CM | POA: Diagnosis not present

## 2019-11-03 DIAGNOSIS — Z743 Need for continuous supervision: Secondary | ICD-10-CM | POA: Diagnosis not present

## 2019-11-03 DIAGNOSIS — Z87891 Personal history of nicotine dependence: Secondary | ICD-10-CM | POA: Insufficient documentation

## 2019-11-03 DIAGNOSIS — R079 Chest pain, unspecified: Secondary | ICD-10-CM | POA: Diagnosis not present

## 2019-11-03 DIAGNOSIS — R0602 Shortness of breath: Secondary | ICD-10-CM | POA: Diagnosis not present

## 2019-11-03 DIAGNOSIS — Z7901 Long term (current) use of anticoagulants: Secondary | ICD-10-CM | POA: Diagnosis not present

## 2019-11-03 DIAGNOSIS — J449 Chronic obstructive pulmonary disease, unspecified: Secondary | ICD-10-CM | POA: Insufficient documentation

## 2019-11-03 DIAGNOSIS — Z79899 Other long term (current) drug therapy: Secondary | ICD-10-CM | POA: Diagnosis not present

## 2019-11-03 DIAGNOSIS — R000 Tachycardia, unspecified: Secondary | ICD-10-CM | POA: Diagnosis not present

## 2019-11-03 DIAGNOSIS — I10 Essential (primary) hypertension: Secondary | ICD-10-CM | POA: Insufficient documentation

## 2019-11-03 DIAGNOSIS — Z9049 Acquired absence of other specified parts of digestive tract: Secondary | ICD-10-CM | POA: Diagnosis not present

## 2019-11-03 LAB — CBC WITH DIFFERENTIAL/PLATELET
Abs Immature Granulocytes: 0.01 10*3/uL (ref 0.00–0.07)
Basophils Absolute: 0 10*3/uL (ref 0.0–0.1)
Basophils Relative: 0 %
Eosinophils Absolute: 0.1 10*3/uL (ref 0.0–0.5)
Eosinophils Relative: 2 %
HCT: 38.7 % (ref 36.0–46.0)
Hemoglobin: 12.5 g/dL (ref 12.0–15.0)
Immature Granulocytes: 0 %
Lymphocytes Relative: 21 %
Lymphs Abs: 0.9 10*3/uL (ref 0.7–4.0)
MCH: 26.7 pg (ref 26.0–34.0)
MCHC: 32.3 g/dL (ref 30.0–36.0)
MCV: 82.7 fL (ref 80.0–100.0)
Monocytes Absolute: 0.4 10*3/uL (ref 0.1–1.0)
Monocytes Relative: 10 %
Neutro Abs: 3 10*3/uL (ref 1.7–7.7)
Neutrophils Relative %: 67 %
Platelets: 274 10*3/uL (ref 150–400)
RBC: 4.68 MIL/uL (ref 3.87–5.11)
RDW: 15.3 % (ref 11.5–15.5)
WBC: 4.5 10*3/uL (ref 4.0–10.5)
nRBC: 0 % (ref 0.0–0.2)

## 2019-11-03 LAB — BASIC METABOLIC PANEL
Anion gap: 12 (ref 5–15)
BUN: 8 mg/dL (ref 8–23)
CO2: 23 mmol/L (ref 22–32)
Calcium: 9.7 mg/dL (ref 8.9–10.3)
Chloride: 104 mmol/L (ref 98–111)
Creatinine, Ser: 0.64 mg/dL (ref 0.44–1.00)
GFR calc Af Amer: >60 mL/min (ref >60)
GFR calc non Af Amer: >60 mL/min (ref >60)
Glucose, Bld: 105 mg/dL — ABNORMAL HIGH (ref 70–99)
Potassium: 3.1 mmol/L — ABNORMAL LOW (ref 3.5–5.1)
Sodium: 139 mmol/L (ref 135–145)

## 2019-11-03 LAB — TROPONIN I (HIGH SENSITIVITY)
Troponin I (High Sensitivity): 8 ng/L (ref <18)
Troponin I (High Sensitivity): 10 ng/L (ref <18)

## 2019-11-03 LAB — BRAIN NATRIURETIC PEPTIDE: B Natriuretic Peptide: 39.3 pg/mL (ref 0.0–100.0)

## 2019-11-03 MED ORDER — GUAIFENESIN-CODEINE 100-10 MG/5ML PO SOLN: 5.0000 mL | Freq: Two times a day (BID) | ORAL | 0 refills | Status: AC | PRN

## 2019-11-03 MED ORDER — ACETAMINOPHEN 325 MG PO TABS
650.0000 mg | ORAL_TABLET | Freq: Once | ORAL | Status: AC
  Administered 2019-11-03: 650 mg via ORAL
  Filled 2019-11-03: qty 2

## 2019-11-03 NOTE — ED Provider Notes (Signed)
[facility] EMERGENCY DEPARTMENT Provider Note   CSN: [id number] Arrival date & time: 11/03/19  [id number]     History Chief Complaint  Patient presents with  . Palpitations    [name] is a 69 y.o. female.  Patient is a 4-year-old female with past medical history of atrial fibrillation, asthma, GERD presenting to the emergency department for palpitations.  Patient reports that this morning around 3:30 AM she woke up feeling like her heart was racing.  Reports that she took one of her diltiazem pills at that time but the sensation continued until about 630 when she called EMS.  Reports that now she currently feels herself.  She did have a slight amount of chest pain and shortness of breath with the palpitations but currently feeling her baseline.  Reports that she has had some chest and throat congestion over the last couple of days but denies any fever, chills or any sick contacts, nausea, vomiting, diarrhea.        Past Medical History:  Diagnosis Date  . A-fib ([place])   . Allergy    seasonal  . Asthma   . Chronic anticoagulation   . Eczema   . GERD (gastroesophageal reflux disease)   . History of kidney stones   . Hypertension   . PAF (paroxysmal atrial fibrillation) ([place]) 11/26/2015   April/May 2017: Multiple ED visits for A. Fib.  02/12/16: Improved on diltiazem IV. Cardiology consulted in the ED and discharge patient with diltiazem XR 120 mg once daily with 60 mg by mouth every 6 hours as needed for symptomatic palpitations, flecainide 50 mg twice daily. Referred to the atrial fibrillation clinic.  02/17/16: Seen in the atrial fibrillation clinic by NP Roderic Palau. Normal sin    Patient Active Problem List   Diagnosis Date Noted  . Fall 07/31/2019  . Chest pain 07/23/2019  . Gallstones 12/08/2018  . Choledocholithiasis   . Vaginal discharge 10/11/2018  . Vaginal bleeding 10/11/2018  . History of radiofrequency ablation (RFA) procedure for cardiac  arrhythmia   . Asthma exacerbation 08/28/2018  . Chronic congestion of paranasal sinus 06/14/2018  . Primary osteoarthritis involving multiple joints 06/14/2018  . Acute pain of left knee 05/15/2018  . Hyperpigmented skin lesion 09/09/2017  . Osteoporosis without current pathological fracture 04/15/2017  . Constipation 10/19/2016  . Normocytic anemia 10/19/2016  . Hyperlipidemia 03/18/2016  . Special screening for malignant neoplasms, colon 03/17/2016  . Cervical cancer screening 03/17/2016  . Assistance with transportation 03/17/2016  . HTN (hypertension), benign   . GERD (gastroesophageal reflux disease)   . PAF (paroxysmal atrial fibrillation) ([place]) 11/26/2015  . Chronic anticoagulation 11/26/2015  . Marijuana abuse 06/11/2015  . Hypokalemia 01/07/2012    Past Surgical History:  Procedure Laterality Date  . ATRIAL FIBRILLATION ABLATION N/A 09/30/2017   Procedure: ATRIAL FIBRILLATION ABLATION;  Surgeon: Thompson Grayer, MD;  Location: [place] [facility];  Service: Cardiovascular;  Laterality: N/A;  . CHOLECYSTECTOMY N/A 12/08/2018   Procedure: LAPAROSCOPIC CHOLECYSTECTOMY WITH INTRAOPERATIVE CHOLANGIOGRAM;  Surgeon: Jovita Kussmaul, MD;  Location: [place];  Service: General;  Laterality: N/A;  . ERCP N/A 12/09/2018   Procedure: ENDOSCOPIC RETROGRADE CHOLANGIOPANCREATOGRAPHY (ERCP);  Surgeon: Carol Ada, MD;  Location: [place];  Service: Endoscopy;  Laterality: N/A;  . SPHINCTEROTOMY  12/09/2018   Procedure: SPHINCTEROTOMY;  Surgeon: Carol Ada, MD;  Location: [place];  Service: Endoscopy;;  . TONSILLECTOMY    . TUBAL LIGATION       OB History  No obstetric history on file.     Family History  Problem Relation Age of Onset  . Heart attack Mother        [name] age  . Breast cancer Sister        Late 73s; now s/p mastectomy   . Lung cancer Brother        x 2  . Cancer Sister   . Cancer Sister   . Cancer Sister   . Colon cancer Neg Hx   . Colon polyps Neg Hx    . Esophageal cancer Neg Hx   . Rectal cancer Neg Hx   . Stomach cancer Neg Hx     Social History   Tobacco Use  . Smoking status: Former Smoker    Quit date: 10/24/2015    Years since quitting: 4.0  . Smokeless tobacco: Never Used  Substance Use Topics  . Alcohol use: Not Currently  . Drug use: Not Currently    Types: Marijuana    Comment: Smoked marijuana in the past.    Home Medications Prior to Admission medications   Medication Sig Start Date End Date Taking? Authorizing Provider  acetaminophen (TYLENOL) 325 MG tablet Take 650 mg by mouth every 6 (six) hours as needed for mild pain.    [provider]  acetaminophen (TYLENOL) 500 MG tablet Take 2 tablets (1,000 mg total) by mouth every 8 (eight) hours as needed. 05/20/19   Charlesetta Shanks, MD  Capsaicin 0.1 % CREA Apply 1 g topically 3 (three) times daily as needed. 08/01/19   Kathi Ludwig, MD  cetirizine (ZYRTEC ALLERGY) 10 MG tablet Take 1 tablet (10 mg total) by mouth daily. 07/23/19 07/22/20  Jeanmarie Hubert, MD  diltiazem (CARDIZEM) 60 MG tablet Take 1 tablet (60 mg total) by mouth every 6 (six) hours as needed (Rapid AFIB over 100). 07/27/19   Sherran Needs, NP  diltiazem (CARTIA XT) 120 MG 24 hr capsule Take 1 capsule (120 mg total) by mouth 2 (two) times daily. 06/26/19   Axel Filler, MD  famotidine (PEPCID) 40 MG tablet Take 1 tablet (40 mg total) by mouth every evening. 09/17/19 09/16/20  Earlene Plater, MD  fluticasone (FLOVENT HFA) 44 MCG/ACT inhaler Inhale 1 puff into the lungs daily. 07/10/19   Axel Filler, MD  guaiFENesin-codeine 100-10 MG/5ML syrup Take 5 mLs by mouth 2 (two) times daily as needed for up to 5 days for cough. 11/03/19 11/08/19  Alveria Apley, PA-C  HYDROcodone-acetaminophen (NORCO/VICODIN) 5-325 MG tablet Take 1-2 tablets by mouth every 6 (six) hours as needed. 10/25/19   Davonna Belling, MD  levalbuterol [facility] HFA) 45 MCG/ACT inhaler INHALE 1 PUFF INTO THE  LUNGS EVERY 6 HOURS AS NEEDED FOR SHORTNESS OF BREATH 07/27/19   Lucious Groves, DO  levalbuterol (XOPENEX) 0.63 MG/3ML nebulizer solution Take 3 mLs (0.63 mg total) by nebulization every 2 (two) hours as needed for wheezing or shortness of breath. 04/07/19   Tillie Fantasia, MD  linaclotide [facility]) 145 MCG CAPS capsule TAKE 1 CAPSULE(145 MCG) BY MOUTH DAILY BEFORE BREAKFAST 06/26/19   Axel Filler, MD  losartan (COZAAR) 50 MG tablet TAKE 1 TABLET(50 MG) BY MOUTH DAILY 10/10/19   Earlene Plater, MD  magnesium hydroxide (MILK OF MAGNESIA) 400 MG/5ML suspension Take 15-30 mLs by mouth at bedtime as needed for mild constipation. 11/01/17   Harris, Vernie Shanks, PA-C  montelukast (SINGULAIR) 10 MG tablet TAKE 1 TABLET(10 MG) BY MOUTH AT BEDTIME 10/10/19   Earlene Plater, MD  ondansetron (ZOFRAN ODT) 4 MG disintegrating tablet Take 1 tablet (4 mg total) by mouth every 8 (eight) hours as needed for nausea or vomiting. 07/23/19   Jeanmarie Hubert, MD  potassium chloride (KLOR-CON) 10 MEQ tablet Take 2 tablets (20 mEq total) by mouth 2 (two) times daily. 06/26/19 09/25/19  Axel Filler, MD  rivaroxaban (XARELTO) 20 MG TABS tablet Take 1 tablet (20 mg total) by mouth daily with supper. 10/03/19   Sherran Needs, NP  tiotropium (SPIRIVA) 18 MCG inhalation capsule Place 1 capsule (18 mcg total) into inhaler and inhale daily. 10/29/19   Earlene Plater, MD  triamcinolone cream (KENALOG) 0.1 % Apply 1 application topically 2 (two) times daily. Mix 1:1 with Eucerin 09/15/19   Suella Broad A, PA-C    Allergies    Albuterol, Percocet [oxycodone-acetaminophen], Celecoxib, and Protonix [pantoprazole sodium]  Review of Systems   Review of Systems  Constitutional: Negative for chills and fever.  HENT: Positive for congestion. Negative for ear pain and sore throat.   Respiratory: Positive for shortness of breath. Negative for cough.   Cardiovascular: Positive for chest pain. Negative for palpitations  and leg swelling.  Gastrointestinal: Negative for abdominal pain, diarrhea, nausea and vomiting.  Genitourinary: Negative for dysuria.  Musculoskeletal: Negative for myalgias.  Skin: Negative for rash.  Neurological: Negative for dizziness and light-headedness.  Hematological: Bruises/bleeds easily.    Physical Exam Updated Vital Signs BP 139/87   Pulse 69   Resp 19   SpO2 97%   Physical Exam Vitals and nursing note reviewed.  Constitutional:      General: She is not in acute distress.    Appearance: Normal appearance. She is not ill-appearing, toxic-appearing or diaphoretic.  HENT:     Head: Normocephalic.     Mouth/Throat:     Mouth: Mucous membranes are moist.     Pharynx: Oropharynx is clear.  Eyes:     Conjunctiva/sclera: Conjunctivae normal.  Cardiovascular:     Rate and Rhythm: Normal rate and regular rhythm.  Pulmonary:     Effort: Pulmonary effort is normal. No respiratory distress.     Breath sounds: Normal breath sounds. No wheezing.  Abdominal:     General: Abdomen is flat. There is no distension.     Tenderness: There is no abdominal tenderness. There is no guarding.  Musculoskeletal:     Right lower leg: No edema.     Left lower leg: No edema.  Skin:    General: Skin is dry.  Neurological:     Mental Status: She is alert.  Psychiatric:        Mood and Affect: Mood normal.     ED Results / Procedures / Treatments   Labs (all labs ordered are listed, but only abnormal results are displayed) Labs Reviewed  BASIC METABOLIC PANEL - Abnormal; Notable for the following components:      Result Value   Potassium 3.1 (*)    Glucose, Bld 105 (*)    All other components within normal limits  CBC WITH DIFFERENTIAL/PLATELET  BRAIN NATRIURETIC PEPTIDE  TROPONIN I (HIGH SENSITIVITY)  TROPONIN I (HIGH SENSITIVITY)    EKG EKG Interpretation  Date/Time:  Saturday November 03 2019 07:48:09 EST Ventricular Rate:  88 PR Interval:  152 QRS Duration: 82 QT  Interval:  382 QTC Calculation: 462 R Axis:   74 Text Interpretation: Normal sinus rhythm Normal ECG No significant change since last tracing Confirmed by Theotis Burrow [phone]) on 11/03/2019 8:23:41 AM   Radiology  DG Chest 2 View  Result Date: 11/03/2019 CLINICAL DATA:  C/o palpitations since 3:30am. States she took her medication and it felt like symptoms got worse. Denies chest pain. States she did have SOB but it has resolved. EXAM: CHEST - 2 VIEW COMPARISON:  10/25/2019 FINDINGS: Cardiac silhouette is normal in size. No mediastinal or hilar masses or evidence of adenopathy. Lungs are hyperexpanded, but clear. No pleural effusion or pneumothorax. Skeletal structures are unremarkable. IMPRESSION: 1. No acute cardiopulmonary disease. 2. Lung hyperexpansion suggesting COPD. Electronically Signed   By: Lajean Manes M.D.   On: 11/03/2019 08:30    Procedures Procedures (including critical care time)  Medications Ordered in ED Medications  acetaminophen (TYLENOL) tablet 650 mg (650 mg Oral Given 11/03/19 [id number])    ED Course  I have reviewed the triage vital signs and the nursing notes.  Pertinent labs & imaging results that were available during my care of the patient were reviewed by me and considered in my medical decision making (see chart for details).  Clinical Course as of Nov 02 1204  Sat Nov 03, 2019  0930 Patient with hx of COPD and paroxysmal afib presenting to the ER for palpitations from 330am until 630 am. Resolved prior to arrival to ED when she took her prn diltiazem 60mg . Patient well appearing with NSR on EKG. Labs, chest xray reassuring. Also reports throat and chest congestion for a few weeks.  Reports taking OTC mucinex and thinks this is what caused the palpations.    [KM]  1140 Patient remained NSR on monitor throughout stay. Suspect she may have had a bout of afib rvr which was controlled with her prn diltaizem. Discussed with Dr. Rex Kras and plan agreed upon to send  home and have PMD f/u. ? Of COPD exacerbation given her congestion for a few weeks. However, she does not have fever, white count or wheezing on exam.    [KM]    Clinical Course User Index [KM] Kristine Royal   MDM Rules/Calculators/A&P                      Based on review of vitals, medical screening exam, lab work and/or imaging, there does not appear to be an acute, emergent etiology for the patient's symptoms. Counseled pt on good return precautions and encouraged both PCP and ED follow-up as needed.  Prior to discharge, I also discussed incidental imaging findings with patient in detail and advised appropriate, recommended follow-up in detail.  Clinical Impression: 1. Palpitations   2. Chronic obstructive pulmonary disease, unspecified COPD type ([place])     Disposition: Discharge  Prior to providing a prescription for a controlled substance, I independently reviewed the patient's recent prescription history on the [place]. The patient had no recent or regular prescriptions and was deemed appropriate for a brief, less than 3 day prescription of narcotic for acute analgesia.  This note was prepared with assistance of Systems analyst. Occasional wrong-word or sound-a-like substitutions may have occurred due to the inherent limitations of voice recognition software.  Final Clinical Impression(s) / ED Diagnoses Final diagnoses:  Palpitations  Chronic obstructive pulmonary disease, unspecified COPD type ([place])    Rx / DC Orders ED Discharge Orders         Ordered    guaiFENesin-codeine 100-10 MG/5ML syrup  2 times daily PRN     11/03/19 1206           Madilyn Hook  A, PA-C 11/03/19 1206    Little, Wenda Overland, MD 11/04/19 [phone]

## 2019-11-03 NOTE — Discharge Instructions (Addendum)
You were seen today for feeling of fast heart rate. Your workup in the ER showed that you were in a normal heart rhythm with a normal rate. Your heart enzymes and other labs were reassuring. Please follow up with the afib clinic and continue to take your regular medications as prescribed. Thank you for allowing me to care for you today. Please return to the emergency department if you have new or worsening symptoms. Take your medications as instructed.

## 2019-11-03 NOTE — ED Triage Notes (Signed)
Pt to triage via [facility].  C/o palpitations since 3:30am. States she took her medication and it felt like symptoms got worse. EMS states pt was initially anxious.  Denies chest pain.  States she did have SOB but it has resolved.

## 2019-11-05 ENCOUNTER — Encounter: Payer: Self-pay | Admitting: Internal Medicine

## 2019-11-05 ENCOUNTER — Ambulatory Visit (INDEPENDENT_AMBULATORY_CARE_PROVIDER_SITE_OTHER): Payer: Medicare Other | Admitting: Internal Medicine

## 2019-11-05 VITALS — BP 130/84 | HR 81 | Temp 97.7°F | Ht 65.0 in | Wt 145.5 lb

## 2019-11-05 DIAGNOSIS — Z9049 Acquired absence of other specified parts of digestive tract: Secondary | ICD-10-CM | POA: Diagnosis not present

## 2019-11-05 DIAGNOSIS — Z79899 Other long term (current) drug therapy: Secondary | ICD-10-CM | POA: Diagnosis not present

## 2019-11-05 DIAGNOSIS — J329 Chronic sinusitis, unspecified: Secondary | ICD-10-CM

## 2019-11-05 DIAGNOSIS — K219 Gastro-esophageal reflux disease without esophagitis: Secondary | ICD-10-CM | POA: Diagnosis not present

## 2019-11-05 DIAGNOSIS — K5900 Constipation, unspecified: Secondary | ICD-10-CM

## 2019-11-05 MED ORDER — LORATADINE 10 MG PO TABS: 10.0000 mg | ORAL_TABLET | Freq: Every day | ORAL | 2 refills | Status: DC

## 2019-11-05 MED ORDER — OMEPRAZOLE 20 MG PO CPDR: 20.0000 mg | DELAYED_RELEASE_CAPSULE | Freq: Every day | ORAL | 5 refills | Status: DC

## 2019-11-05 MED ORDER — DOCUSATE SODIUM 100 MG PO CAPS: 100.0000 mg | ORAL_CAPSULE | Freq: Two times a day (BID) | ORAL | 3 refills | Status: DC

## 2019-11-05 NOTE — Assessment & Plan Note (Addendum)
Pt has a long hx of constipation.  The patient states that she has tried MiraLAX and milk of magnesia the past.  She states that MiraLAX makes her GERD worse, and the milk of magnesia gives her abdominal cramps and does not want to try these medications again.  She wants something else that will solve her constipation.  I set expectations, stating that since this is a chronic issue we can likely do trial and error of different medications but we are unlikely to find a solution without trying and failing some things.  Patient understands. -Start Colace twice a day -Encourage patient to eat fiber rich foods and drinking prune juice.

## 2019-11-05 NOTE — Assessment & Plan Note (Addendum)
Patient states that she has been taking omeprazole for her GERD, and would like a refill of this prescription.  The patient states she has not been taking famotidine, although this was on her medication list. -Refilled omeprazole 20 mg daily

## 2019-11-05 NOTE — Assessment & Plan Note (Signed)
Patient continues to complain of chronic sinus congestion.  Per chart review, the patient has been advised to try Flonase and Nettie pot, however the patient states she does not like fluids being sprayed in her nose and does not want to try this. -Stop cetirizine -Start loratadine 10 mg daily -Follow-up at next visit

## 2019-11-05 NOTE — Progress Notes (Signed)
   CC: Constipation   HPI:  [name] is a 69 y.o. female with a history as noted below who presents today constipation. Please refer to the problem based charting for details.  Past Medical History:  Diagnosis Date  . A-fib ([place])   . Allergy    seasonal  . Asthma   . Chronic anticoagulation   . Eczema   . GERD (gastroesophageal reflux disease)   . History of kidney stones   . Hypertension   . PAF (paroxysmal atrial fibrillation) ([place]) 11/26/2015   April/May 2017: Multiple ED visits for A. Fib.  02/12/16: Improved on diltiazem IV. Cardiology consulted in the ED and discharge patient with diltiazem XR 120 mg once daily with 60 mg by mouth every 6 hours as needed for symptomatic palpitations, flecainide 50 mg twice daily. Referred to the atrial fibrillation clinic.  02/17/16: Seen in the atrial fibrillation clinic by NP Roderic Palau. Normal sin   Review of Systems: All systems reviewed and are otherwise negative unless present in the HPI  Physical Exam:  Vitals:   11/05/19 1444  BP: 130/84  Pulse: 81  Temp: 97.7 F (36.5 C)  TempSrc: Oral  SpO2: 96%  Weight: 145 lb 8 oz (66 kg)  Height: 5\' 5"  (1.651 m)   Physical Exam Vitals reviewed.  Constitutional:      General: She is not in acute distress.    Appearance: Normal appearance. She is normal weight. She is not ill-appearing or toxic-appearing.  HENT:     Head: Normocephalic and atraumatic.  Cardiovascular:     Rate and Rhythm: Normal rate and regular rhythm.     Pulses: Normal pulses.     Heart sounds: Normal heart sounds. No murmur. No friction rub. No gallop.   Pulmonary:     Effort: Pulmonary effort is normal. No respiratory distress.     Breath sounds: Normal breath sounds. No wheezing or rales.  Abdominal:     General: Abdomen is flat. Bowel sounds are normal. There is no distension.     Palpations: Abdomen is soft.     Tenderness: There is abdominal tenderness (mild RUQ). There is no guarding.   Comments: Well-healed scars from prior cholecystectomy  Musculoskeletal:     Right lower leg: No edema.     Left lower leg: No edema.  Skin:    General: Skin is warm.  Neurological:     General: No focal deficit present.     Mental Status: She is alert and oriented to person, place, and time.  Psychiatric:        Mood and Affect: Mood normal.    Assessment & Plan:   See Encounters Tab for problem based charting.  Patient discussed with Dr. Rebeca Alert

## 2019-11-05 NOTE — Patient Instructions (Addendum)
Thank you for allowing [name] to take care of you today.  Is a summary of what we discussed:  1.  Constipation -I sent a prescription of Colace to your pharmacy.  Take this medicine twice a day every day.  2.  Sinus congestion -Stop taking cetirizine and start taking loratadine once a day.  3.  Reflux -Stop taking famotidine, and start taking omeprazole every day.  I sent this prescription to your pharmacy  You have any questions or concerns, please feel free to reach out to [name].

## 2019-11-06 ENCOUNTER — Ambulatory Visit: Payer: Medicare Other

## 2019-11-06 ENCOUNTER — Other Ambulatory Visit: Payer: Self-pay

## 2019-11-06 ENCOUNTER — Telehealth: Payer: Self-pay | Admitting: Internal Medicine

## 2019-11-06 NOTE — Telephone Encounter (Signed)
These have been refilled and sent to pharmacy

## 2019-11-06 NOTE — Telephone Encounter (Signed)
Requesting to speak with Dr. Sheppard Coil, please call pt back.

## 2019-11-06 NOTE — Telephone Encounter (Signed)
Omeprazole-   Loratadine   Pt is looking for the medicine the physician gave for constipation; pls contact [phone]

## 2019-11-07 ENCOUNTER — Other Ambulatory Visit (HOSPITAL_COMMUNITY): Payer: Self-pay | Admitting: *Deleted

## 2019-11-07 MED ORDER — RIVAROXABAN 20 MG PO TABS: 20.0000 mg | ORAL_TABLET | Freq: Every day | ORAL | 6 refills | Status: DC

## 2019-11-13 NOTE — Progress Notes (Signed)
Internal Medicine Clinic Attending  Case discussed with Dr. Caedan Sumler at the time of the visit.  We reviewed the resident's history and exam and pertinent patient test results.  I agree with the assessment, diagnosis, and plan of care documented in the resident's note.  Hanish Laraia, M.D., Ph.D.  

## 2019-11-26 ENCOUNTER — Ambulatory Visit: Payer: Medicare Other

## 2019-11-30 ENCOUNTER — Encounter (HOSPITAL_COMMUNITY): Payer: Self-pay

## 2019-11-30 ENCOUNTER — Other Ambulatory Visit: Payer: Self-pay

## 2019-11-30 ENCOUNTER — Emergency Department (HOSPITAL_COMMUNITY): Payer: Medicare Other

## 2019-11-30 ENCOUNTER — Emergency Department (HOSPITAL_COMMUNITY)
Admission: EM | Admit: 2019-11-30 | Discharge: 2019-11-30 | Disposition: A | Discharge status: Home / Self Care | Payer: Medicare Other | Attending: Emergency Medicine | Admitting: Emergency Medicine

## 2019-11-30 DIAGNOSIS — Z7901 Long term (current) use of anticoagulants: Secondary | ICD-10-CM | POA: Diagnosis not present

## 2019-11-30 DIAGNOSIS — I480 Paroxysmal atrial fibrillation: Secondary | ICD-10-CM | POA: Insufficient documentation

## 2019-11-30 DIAGNOSIS — K644 Residual hemorrhoidal skin tags: Secondary | ICD-10-CM | POA: Diagnosis not present

## 2019-11-30 DIAGNOSIS — Z79899 Other long term (current) drug therapy: Secondary | ICD-10-CM | POA: Insufficient documentation

## 2019-11-30 DIAGNOSIS — R1084 Generalized abdominal pain: Secondary | ICD-10-CM | POA: Diagnosis not present

## 2019-11-30 DIAGNOSIS — K625 Hemorrhage of anus and rectum: Secondary | ICD-10-CM

## 2019-11-30 DIAGNOSIS — J45909 Unspecified asthma, uncomplicated: Secondary | ICD-10-CM | POA: Diagnosis not present

## 2019-11-30 DIAGNOSIS — I10 Essential (primary) hypertension: Secondary | ICD-10-CM | POA: Diagnosis not present

## 2019-11-30 DIAGNOSIS — N200 Calculus of kidney: Secondary | ICD-10-CM | POA: Diagnosis not present

## 2019-11-30 DIAGNOSIS — K649 Unspecified hemorrhoids: Secondary | ICD-10-CM

## 2019-11-30 DIAGNOSIS — Z87442 Personal history of urinary calculi: Secondary | ICD-10-CM | POA: Insufficient documentation

## 2019-11-30 DIAGNOSIS — Z87891 Personal history of nicotine dependence: Secondary | ICD-10-CM | POA: Diagnosis not present

## 2019-11-30 LAB — COMPREHENSIVE METABOLIC PANEL
ALT: 20 U/L (ref 0–44)
AST: 19 U/L (ref 15–41)
Albumin: 3.6 g/dL (ref 3.5–5.0)
Alkaline Phosphatase: 81 U/L (ref 38–126)
Anion gap: 9 (ref 5–15)
BUN: 9 mg/dL (ref 8–23)
CO2: 25 mmol/L (ref 22–32)
Calcium: 9.5 mg/dL (ref 8.9–10.3)
Chloride: 107 mmol/L (ref 98–111)
Creatinine, Ser: 0.69 mg/dL (ref 0.44–1.00)
GFR calc Af Amer: >60 mL/min (ref >60)
GFR calc non Af Amer: >60 mL/min (ref >60)
Glucose, Bld: 132 mg/dL — ABNORMAL HIGH (ref 70–99)
Potassium: 3.2 mmol/L — ABNORMAL LOW (ref 3.5–5.1)
Sodium: 141 mmol/L (ref 135–145)
Total Bilirubin: 0.4 mg/dL (ref 0.3–1.2)
Total Protein: 6.8 g/dL (ref 6.5–8.1)

## 2019-11-30 LAB — CBC
HCT: 37.7 % (ref 36.0–46.0)
Hemoglobin: 12 g/dL (ref 12.0–15.0)
MCH: 26.5 pg (ref 26.0–34.0)
MCHC: 31.8 g/dL (ref 30.0–36.0)
MCV: 83.2 fL (ref 80.0–100.0)
Platelets: 235 10*3/uL (ref 150–400)
RBC: 4.53 MIL/uL (ref 3.87–5.11)
RDW: 15.5 % (ref 11.5–15.5)
WBC: 3.4 10*3/uL — ABNORMAL LOW (ref 4.0–10.5)
nRBC: 0 % (ref 0.0–0.2)

## 2019-11-30 LAB — TYPE AND SCREEN
ABO/RH(D): O POS
Antibody Screen: NEGATIVE

## 2019-11-30 LAB — POC OCCULT BLOOD, ED: Fecal Occult Bld: NEGATIVE

## 2019-11-30 MED ORDER — HYDROCORTISONE ACETATE 25 MG RE SUPP: 25.0000 mg | Freq: Two times a day (BID) | RECTAL | 0 refills | Status: DC

## 2019-11-30 NOTE — ED Notes (Signed)
pty wants food and drink  Needs to wait until c-t scan finished

## 2019-11-30 NOTE — ED Triage Notes (Signed)
Pt reports lower back pain and rectal bleeding since this morning. Pt on xarelto. Pt a.o, nad noted.

## 2019-11-30 NOTE — ED Provider Notes (Signed)
[facility] EMERGENCY DEPARTMENT Provider Note   CSN: [id number] Arrival date & time: 11/30/19  [id number]     History Chief Complaint  Patient presents with  . Back Pain  . Rectal Bleeding    [name] is a 69 y.o. female who presents emergency department with a chief complaint of bleeding from her rectum.  The patient has a history of paroxysmal atrial fibrillation and is on long-term anticoagulation therapy with Xarelto.  She complains of a history of constipation.  She is currently taking Metamucil, MiraLAX and Linzess.  The patient states that she went to make a bowel movement this morning and after she passed her stool she had bright red blood per rectum.  She states that she first noticed when she wiped and then she continued to have bleeding from her rectum which was like "having a.."  She soaked 1 sanitary pad.  Her bleeding has stopped.  She denies a known history of hemorrhoids.  She has had a colonoscopy in the past about 2 and half years ago.  The patient is concerned that maybe her bleeding is due to taking the Linzess.  She is exercising regularly and drinks a lot of water.  She eats plenty of fruits and vegetables.  Patient has also noticed having back pain for at least the last 2 weeks.  The pain is worse when she needs to make a bowel movement and better after she makes a bowel movement.  She denies pain with movement, numbness or weakness of the lower extremities, saddle anesthesia, bowel or bladder incontinence.  She has noticed tenesmus and cramping with bowel movements.  She denies fevers, chills, nausea, vomiting, fevers or urinary symptoms.        Past Medical History:  Diagnosis Date  . A-fib ([place])   . Allergy    seasonal  . Asthma   . Chronic anticoagulation   . Eczema   . GERD (gastroesophageal reflux disease)   . History of kidney stones   . Hypertension   . PAF (paroxysmal atrial fibrillation) ([place]) 11/26/2015   April/May 2017: Multiple ED  visits for A. Fib.  02/12/16: Improved on diltiazem IV. Cardiology consulted in the ED and discharge patient with diltiazem XR 120 mg once daily with 60 mg by mouth every 6 hours as needed for symptomatic palpitations, flecainide 50 mg twice daily. Referred to the atrial fibrillation clinic.  02/17/16: Seen in the atrial fibrillation clinic by NP Roderic Palau. Normal sin    Patient Active Problem List   Diagnosis Date Noted  . Fall 07/31/2019  . Chest pain 07/23/2019  . Gallstones 12/08/2018  . Choledocholithiasis   . Vaginal discharge 10/11/2018  . Vaginal bleeding 10/11/2018  . History of radiofrequency ablation (RFA) procedure for cardiac arrhythmia   . Asthma exacerbation 08/28/2018  . Chronic congestion of paranasal sinus 06/14/2018  . Primary osteoarthritis involving multiple joints 06/14/2018  . Acute pain of left knee 05/15/2018  . Hyperpigmented skin lesion 09/09/2017  . Osteoporosis without current pathological fracture 04/15/2017  . Constipation 10/19/2016  . Normocytic anemia 10/19/2016  . Hyperlipidemia 03/18/2016  . Special screening for malignant neoplasms, colon 03/17/2016  . Cervical cancer screening 03/17/2016  . Assistance with transportation 03/17/2016  . HTN (hypertension), benign   . GERD (gastroesophageal reflux disease)   . PAF (paroxysmal atrial fibrillation) ([place]) 11/26/2015  . Chronic anticoagulation 11/26/2015  . Marijuana abuse 06/11/2015  . Hypokalemia 01/07/2012    Past Surgical History:  Procedure Laterality Date  .  ATRIAL FIBRILLATION ABLATION N/A 09/30/2017   Procedure: ATRIAL FIBRILLATION ABLATION;  Surgeon: Thompson Grayer, MD;  Location: [facility];  Service: Cardiovascular;  Laterality: N/A;  . CHOLECYSTECTOMY N/A 12/08/2018   Procedure: LAPAROSCOPIC CHOLECYSTECTOMY WITH INTRAOPERATIVE CHOLANGIOGRAM;  Surgeon: Jovita Kussmaul, MD;  Location: [place];  Service: General;  Laterality: N/A;  . ERCP N/A 12/09/2018   Procedure: ENDOSCOPIC  RETROGRADE CHOLANGIOPANCREATOGRAPHY (ERCP);  Surgeon: Carol Ada, MD;  Location: [place];  Service: Endoscopy;  Laterality: N/A;  . SPHINCTEROTOMY  12/09/2018   Procedure: SPHINCTEROTOMY;  Surgeon: Carol Ada, MD;  Location: [place];  Service: Endoscopy;;  . TONSILLECTOMY    . TUBAL LIGATION       OB History   No obstetric history on file.     Family History  Problem Relation Age of Onset  . Heart attack Mother        [name] age  . Breast cancer Sister        Late 42s; now s/p mastectomy   . Lung cancer Brother        x 2  . Cancer Sister   . Cancer Sister   . Cancer Sister   . Colon cancer Neg Hx   . Colon polyps Neg Hx   . Esophageal cancer Neg Hx   . Rectal cancer Neg Hx   . Stomach cancer Neg Hx     Social History   Tobacco Use  . Smoking status: Former Smoker    Quit date: 10/24/2015    Years since quitting: 4.1  . Smokeless tobacco: Never Used  Substance Use Topics  . Alcohol use: Not Currently  . Drug use: Not Currently    Types: Marijuana    Comment: Smoked marijuana in the past.    Home Medications Prior to Admission medications   Medication Sig Start Date End Date Taking? Authorizing Provider  acetaminophen (TYLENOL) 325 MG tablet Take 650 mg by mouth every 6 (six) hours as needed for mild pain.    [provider]  acetaminophen (TYLENOL) 500 MG tablet Take 2 tablets (1,000 mg total) by mouth every 8 (eight) hours as needed. 05/20/19   Charlesetta Shanks, MD  Capsaicin 0.1 % CREA Apply 1 g topically 3 (three) times daily as needed. 08/01/19   Kathi Ludwig, MD  diltiazem (CARDIZEM) 60 MG tablet Take 1 tablet (60 mg total) by mouth every 6 (six) hours as needed (Rapid AFIB over 100). 07/27/19   Sherran Needs, NP  diltiazem (CARTIA XT) 120 MG 24 hr capsule Take 1 capsule (120 mg total) by mouth 2 (two) times daily. 06/26/19   Axel Filler, MD  docusate sodium (COLACE) 100 MG capsule Take 1 capsule (100 mg total) by mouth 2  (two) times daily. 11/05/19 11/04/20  Earlene Plater, MD  fluticasone (FLOVENT HFA) 44 MCG/ACT inhaler Inhale 1 puff into the lungs daily. 07/10/19   Axel Filler, MD  HYDROcodone-acetaminophen (NORCO/VICODIN) 5-325 MG tablet Take 1-2 tablets by mouth every 6 (six) hours as needed. 10/25/19   Davonna Belling, MD  levalbuterol [facility] HFA) 45 MCG/ACT inhaler INHALE 1 PUFF INTO THE LUNGS EVERY 6 HOURS AS NEEDED FOR SHORTNESS OF BREATH 07/27/19   Lucious Groves, DO  levalbuterol (XOPENEX) 0.63 MG/3ML nebulizer solution Take 3 mLs (0.63 mg total) by nebulization every 2 (two) hours as needed for wheezing or shortness of breath. 04/07/19   Tillie Fantasia, MD  linaclotide (LINZESS) 145 MCG CAPS capsule TAKE 1 CAPSULE(145 MCG) BY MOUTH DAILY  BEFORE BREAKFAST 06/26/19   Axel Filler, MD  loratadine (CLARITIN) 10 MG tablet Take 1 tablet (10 mg total) by mouth daily. 11/05/19 11/04/20  Earlene Plater, MD  losartan (COZAAR) 50 MG tablet TAKE 1 TABLET(50 MG) BY MOUTH DAILY 10/10/19   Earlene Plater, MD  magnesium hydroxide (MILK OF MAGNESIA) 400 MG/5ML suspension Take 15-30 mLs by mouth at bedtime as needed for mild constipation. 11/01/17   Jadence Kinlaw, PA-C  montelukast (SINGULAIR) 10 MG tablet TAKE 1 TABLET(10 MG) BY MOUTH AT BEDTIME 10/10/19   Earlene Plater, MD  omeprazole (PRILOSEC) 20 MG capsule Take 1 capsule (20 mg total) by mouth daily. 11/05/19 11/04/20  Earlene Plater, MD  ondansetron (ZOFRAN ODT) 4 MG disintegrating tablet Take 1 tablet (4 mg total) by mouth every 8 (eight) hours as needed for nausea or vomiting. 07/23/19   Jeanmarie Hubert, MD  potassium chloride (KLOR-CON) 10 MEQ tablet Take 2 tablets (20 mEq total) by mouth 2 (two) times daily. 06/26/19 09/25/19  Axel Filler, MD  rivaroxaban (XARELTO) 20 MG TABS tablet Take 1 tablet (20 mg total) by mouth daily with supper. 11/07/19   Sherran Needs, NP  tiotropium (SPIRIVA) 18 MCG inhalation capsule Place 1  capsule (18 mcg total) into inhaler and inhale daily. 10/29/19   Earlene Plater, MD  triamcinolone cream (KENALOG) 0.1 % Apply 1 application topically 2 (two) times daily. Mix 1:1 with Eucerin 09/15/19   Tacy Learn, PA-C    Allergies    Albuterol, Percocet [oxycodone-acetaminophen], Celecoxib, and Protonix [pantoprazole sodium]  Review of Systems   Review of Systems Ten systems reviewed and are negative for acute change, except as noted in the HPI.   Physical Exam Updated Vital Signs BP (!) 143/89 (BP Location: Right Arm)   Pulse 74   Temp 98.1 F (36.7 C) (Oral)   Resp 16   Ht 5\' 7"  (1.702 m)   Wt 68 kg   SpO2 98%   BMI 23.49 kg/m   Physical Exam Vitals and nursing note reviewed.  Constitutional:      General: She is not in acute distress.    Appearance: She is well-developed. She is not diaphoretic.  HENT:     Head: Normocephalic and atraumatic.  Eyes:     General: No scleral icterus.    Conjunctiva/sclera: Conjunctivae normal.  Cardiovascular:     Rate and Rhythm: Normal rate and regular rhythm.     Heart sounds: Normal heart sounds. No murmur. No friction rub. No gallop.   Pulmonary:     Effort: Pulmonary effort is normal. No respiratory distress.     Breath sounds: Normal breath sounds.  Abdominal:     General: Bowel sounds are normal. There is no distension.     Palpations: Abdomen is soft. There is no mass.     Tenderness: There is abdominal tenderness. There is no guarding.  Genitourinary:    Comments: Bulging internal hemorrhoids with dilated veins.  No obvious bleeding.  Tone is normal.  Dark stool on examining finger not obviously melanotic.  No bright red blood. Musculoskeletal:     Cervical back: Normal range of motion.  Skin:    General: Skin is warm and dry.  Neurological:     Mental Status: She is alert and oriented to person, place, and time.  Psychiatric:        Behavior: Behavior normal.      ED Results / Procedures / Treatments    Labs (all labs ordered are listed,  but only abnormal results are displayed) Labs Reviewed  COMPREHENSIVE METABOLIC PANEL - Abnormal; Notable for the following components:      Result Value   Potassium 3.2 (*)    Glucose, Bld 132 (*)    All other components within normal limits  CBC - Abnormal; Notable for the following components:   WBC 3.4 (*)    All other components within normal limits  POC OCCULT BLOOD, ED  TYPE AND SCREEN    EKG None  Radiology No results found.  Procedures Procedures (including critical care time)  Medications Ordered in ED Medications - No data to display  ED Course  I have reviewed the triage vital signs and the nursing notes.  Pertinent labs & imaging results that were available during my care of the patient were reviewed by me and considered in my medical decision making (see chart for details).    MDM Rules/Calculators/A&P                     69 year old female here with complaint of rectal bleeding which has resolved in the interim.The patient has a negative Hemoccult and obvious bulging probably grade 1 internal hemorrhoid prolapse.  No active bleeding on my examination.  I personally ordered reviewed and interpreted the patient's labs which shows mild hypokalemia on CMP with mild hyperglycemia of insignificant value.  White blood cell count is just below normal but normal hemoglobin level.  She is hemodynamically stable and actually hypertensive throughout the visit.  I personally ordered, reviewed the images of the CT abdomen and pelvis which show no acute abnormalities.  We are unable to establish IV, access however I do not think a contrasted CT would have provided significant difference.  The patient has no pain at this time.  No obvious masses on her CT scan.  Patient will be discharged with Anusol HC suppositories and close outpatient PCP/GI follow-up.  MDM Number of Diagnoses or Management Options Bright red blood per rectum: new, no  workup Hemorrhoids, unspecified hemorrhoid type: new, no workup   Amount and/or Complexity of Data Reviewed Clinical lab tests: ordered and reviewed Discussion of test results with the performing providers: yes Decide to obtain previous medical records or to obtain history from someone other than the patient: yes Obtain history from someone other than the patient: no Review and summarize past medical records: yes Independent visualization of images, tracings, or specimens: yes  Risk of Complications, Morbidity, and/or Mortality Presenting problems: moderate Diagnostic procedures: moderate Management options: moderate  Patient Progress Patient progress: resolved  Final Clinical Impression(s) / ED Diagnoses Final diagnoses:  None    Rx / DC Orders ED Discharge Orders    None       Margarita Mail, PA-C 11/30/19 2021    Davonna Belling, MD 12/03/19 1516

## 2019-11-30 NOTE — Progress Notes (Signed)
Present to place PIV.  Patient not in room.  Please re consult IV team when patient is back.  Thanks

## 2019-11-30 NOTE — Discharge Instructions (Addendum)
Get help right away if you have: Uncontrolled bleeding from your rectum.

## 2019-11-30 NOTE — ED Notes (Signed)
Back from c-t asking for food and drink again  Message given again npo until results are given from the radiologist

## 2019-11-30 NOTE — ED Notes (Signed)
To ct

## 2019-12-03 ENCOUNTER — Encounter: Payer: Medicare Other | Admitting: Internal Medicine

## 2019-12-03 NOTE — Progress Notes (Deleted)
   CC: ***  HPI:  [name] is a 69 y.o. with a hx as noted below who presents for ***.  Past Medical History:  Diagnosis Date  . A-fib ([place])   . Allergy    seasonal  . Asthma   . Chronic anticoagulation   . Eczema   . GERD (gastroesophageal reflux disease)   . History of kidney stones   . Hypertension   . PAF (paroxysmal atrial fibrillation) ([place]) 11/26/2015   April/May 2017: Multiple ED visits for A. Fib.  02/12/16: Improved on diltiazem IV. Cardiology consulted in the ED and discharge patient with diltiazem XR 120 mg once daily with 60 mg by mouth every 6 hours as needed for symptomatic palpitations, flecainide 50 mg twice daily. Referred to the atrial fibrillation clinic.  02/17/16: Seen in the atrial fibrillation clinic by NP Roderic Palau. Normal sin   Review of Systems:  All systems were reviewed and are otherwise negative unless mentioned in the HPI.  Physical Exam:  There were no vitals filed for this visit.   Physical Exam  Assessment & Plan:   See Encounters Tab for problem based charting.  Patient discussed with Dr. {NAMES:[id number]::"Butcher","Guilloud","Hoffman","Mullen","Narendra","Raines","Vincent"}

## 2019-12-04 ENCOUNTER — Encounter: Payer: Self-pay | Admitting: Internal Medicine

## 2019-12-12 ENCOUNTER — Ambulatory Visit
Admission: RE | Admit: 2019-12-12 | Discharge: 2019-12-12 | Disposition: A | Discharge status: Home / Self Care | Payer: Medicare Other | Source: Ambulatory Visit | Attending: Student in an Organized Health Care Education/Training Program | Admitting: Student in an Organized Health Care Education/Training Program

## 2019-12-12 ENCOUNTER — Other Ambulatory Visit: Payer: Self-pay

## 2019-12-12 DIAGNOSIS — Z1231 Encounter for screening mammogram for malignant neoplasm of breast: Secondary | ICD-10-CM

## 2019-12-19 IMAGING — CR DG CHEST 2V
2 series · 2 of 2 positions shown · non-contrast
Comparison: Prior chest x-ray 08/28/2018

CLINICAL DATA: 67-year-old female with asthma exacerbation,
shortness of breath

EXAM:
CHEST - 2 VIEW

[chest pa]
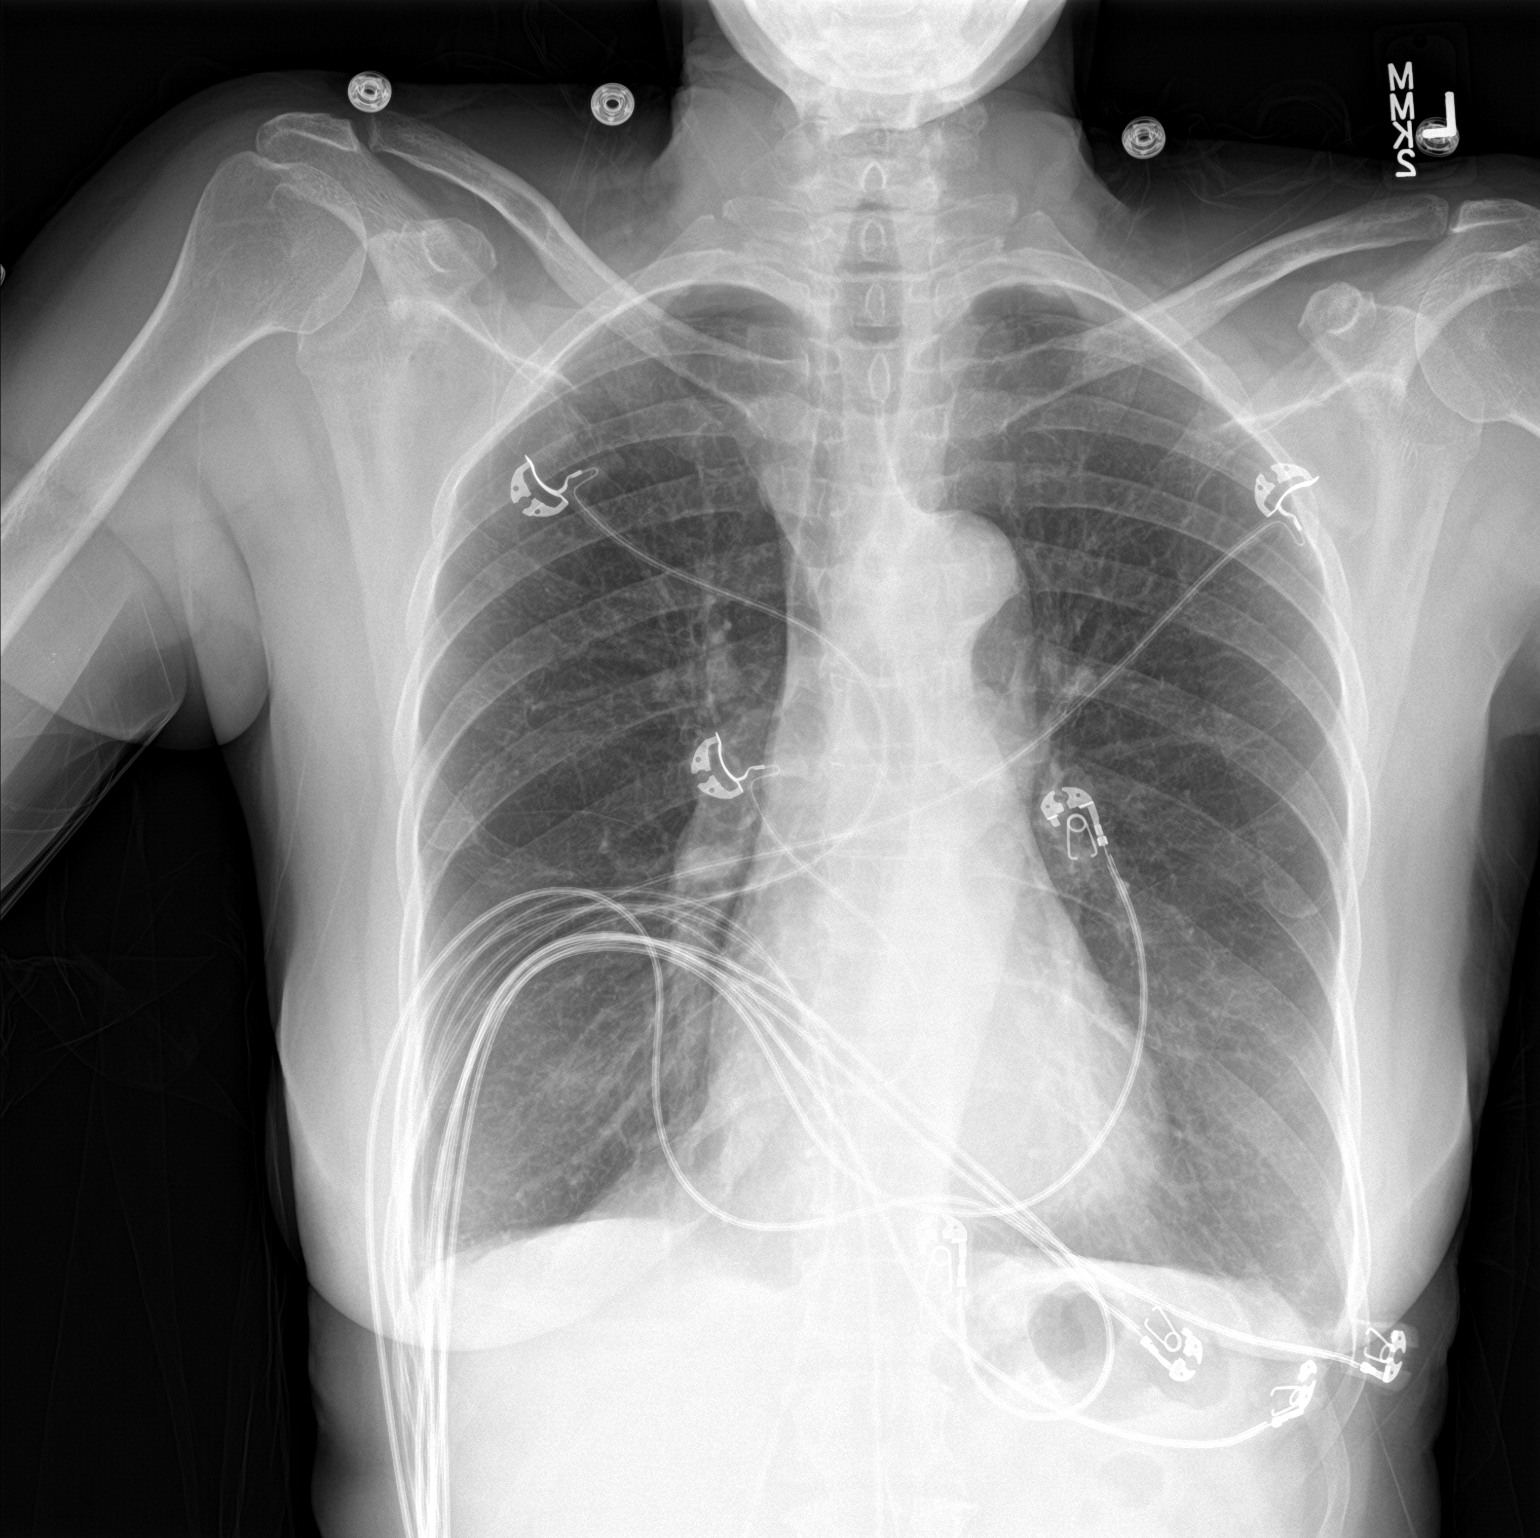

[chest lat]
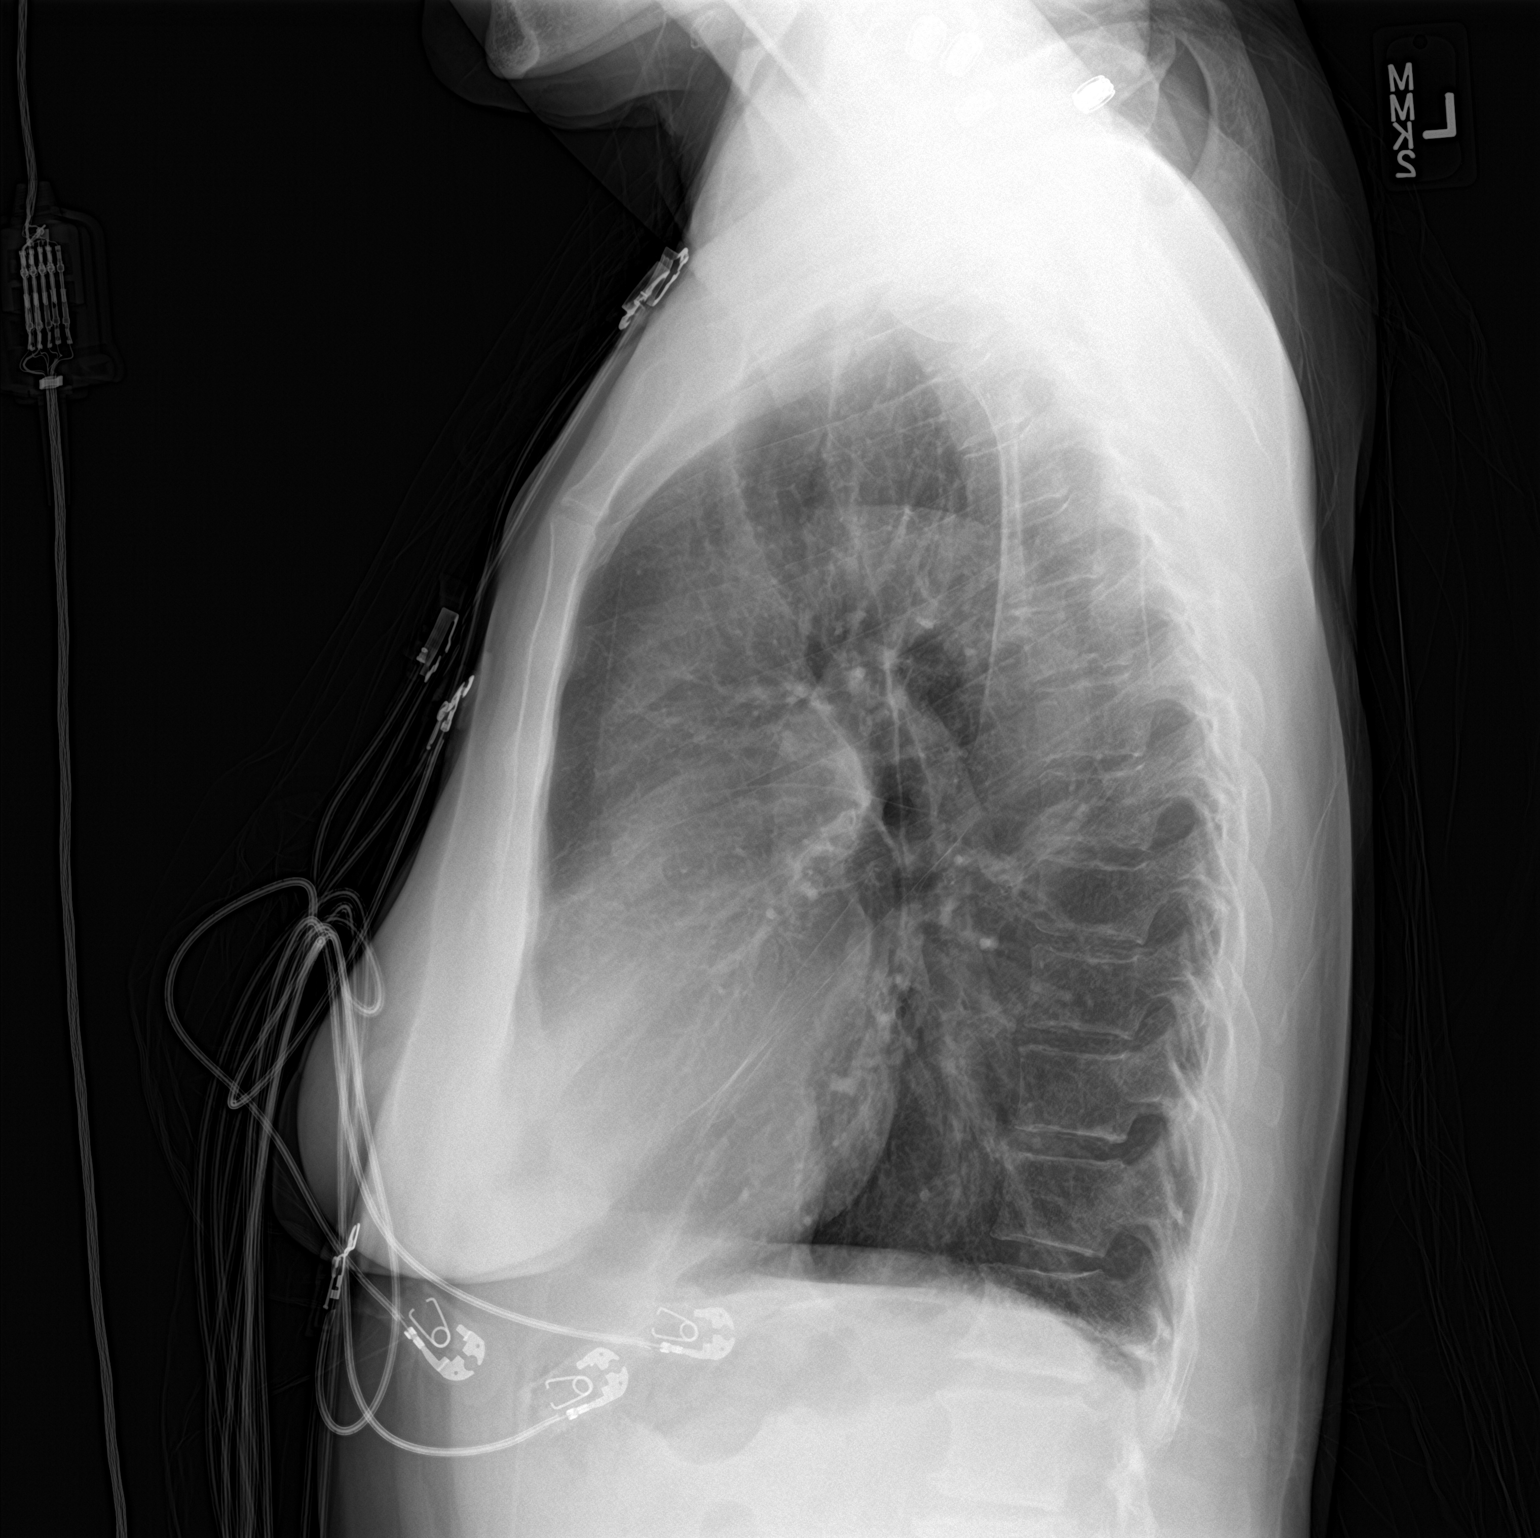

[2 of 2 positions shown; findings below may reference images not displayed]

FINDINGS: The lungs are clear and negative for focal airspace consolidation,
pulmonary edema or suspicious pulmonary nodule. Mild pulmonary
hyperinflation, similar compared to prior. Minimal bronchitic
changes. Atherosclerotic calcifications again visualized in the
thoracic aorta. No pleural effusion or pneumothorax. Cardiac and
mediastinal contours are within normal limits. No acute fracture or
lytic or blastic osseous lesions. The visualized upper abdominal
bowel gas pattern is unremarkable.
IMPRESSION: Stable mild hyperinflation and chronic bronchitic changes without
evidence of acute cardiopulmonary process.

Aortic Atherosclerosis (VP6OP-170.0)

## 2020-01-03 ENCOUNTER — Other Ambulatory Visit: Payer: Self-pay

## 2020-01-03 ENCOUNTER — Emergency Department (HOSPITAL_COMMUNITY)
Admission: EM | Admit: 2020-01-03 | Discharge: 2020-01-03 | Disposition: A | Discharge status: Home / Self Care | Payer: Medicare Other | Attending: Emergency Medicine | Admitting: Emergency Medicine

## 2020-01-03 ENCOUNTER — Encounter (HOSPITAL_COMMUNITY): Payer: Self-pay | Admitting: Pediatrics

## 2020-01-03 ENCOUNTER — Emergency Department (HOSPITAL_COMMUNITY): Payer: Medicare Other

## 2020-01-03 DIAGNOSIS — R1031 Right lower quadrant pain: Secondary | ICD-10-CM | POA: Insufficient documentation

## 2020-01-03 DIAGNOSIS — M545 Low back pain: Secondary | ICD-10-CM | POA: Diagnosis not present

## 2020-01-03 DIAGNOSIS — R0981 Nasal congestion: Secondary | ICD-10-CM | POA: Diagnosis not present

## 2020-01-03 DIAGNOSIS — I480 Paroxysmal atrial fibrillation: Secondary | ICD-10-CM | POA: Diagnosis not present

## 2020-01-03 DIAGNOSIS — Z79899 Other long term (current) drug therapy: Secondary | ICD-10-CM | POA: Insufficient documentation

## 2020-01-03 DIAGNOSIS — Z7901 Long term (current) use of anticoagulants: Secondary | ICD-10-CM | POA: Insufficient documentation

## 2020-01-03 DIAGNOSIS — Z87891 Personal history of nicotine dependence: Secondary | ICD-10-CM | POA: Diagnosis not present

## 2020-01-03 DIAGNOSIS — R109 Unspecified abdominal pain: Secondary | ICD-10-CM

## 2020-01-03 DIAGNOSIS — I10 Essential (primary) hypertension: Secondary | ICD-10-CM | POA: Insufficient documentation

## 2020-01-03 LAB — BASIC METABOLIC PANEL
Anion gap: 13 (ref 5–15)
BUN: 17 mg/dL (ref 8–23)
CO2: 22 mmol/L (ref 22–32)
Calcium: 9.6 mg/dL (ref 8.9–10.3)
Chloride: 108 mmol/L (ref 98–111)
Creatinine, Ser: 0.88 mg/dL (ref 0.44–1.00)
GFR calc Af Amer: >60 mL/min (ref >60)
GFR calc non Af Amer: >60 mL/min (ref >60)
Glucose, Bld: 109 mg/dL — ABNORMAL HIGH (ref 70–99)
Potassium: 3.7 mmol/L (ref 3.5–5.1)
Sodium: 143 mmol/L (ref 135–145)

## 2020-01-03 LAB — CBC
HCT: 39.4 % (ref 36.0–46.0)
Hemoglobin: 12.4 g/dL (ref 12.0–15.0)
MCH: 26.4 pg (ref 26.0–34.0)
MCHC: 31.5 g/dL (ref 30.0–36.0)
MCV: 83.8 fL (ref 80.0–100.0)
Platelets: 239 10*3/uL (ref 150–400)
RBC: 4.7 MIL/uL (ref 3.87–5.11)
RDW: 16.1 % — ABNORMAL HIGH (ref 11.5–15.5)
WBC: 4.7 10*3/uL (ref 4.0–10.5)
nRBC: 0 % (ref 0.0–0.2)

## 2020-01-03 LAB — URINALYSIS, ROUTINE W REFLEX MICROSCOPIC
Bilirubin Urine: NEGATIVE
Glucose, UA: NEGATIVE mg/dL
Hgb urine dipstick: NEGATIVE
Ketones, ur: NEGATIVE mg/dL
Leukocytes,Ua: NEGATIVE
Nitrite: NEGATIVE
Protein, ur: NEGATIVE mg/dL
Specific Gravity, Urine: 1.019 (ref 1.005–1.030)
pH: 5 (ref 5.0–8.0)

## 2020-01-03 MED ORDER — HYDROCODONE-ACETAMINOPHEN 5-325 MG PO TABS: 1.0000 | ORAL_TABLET | Freq: Four times a day (QID) | ORAL | 0 refills | Status: DC | PRN

## 2020-01-03 MED ORDER — FENTANYL CITRATE (PF) 100 MCG/2ML IJ SOLN
50.0000 ug | Freq: Once | INTRAMUSCULAR | Status: AC
  Administered 2020-01-03: 50 ug via INTRAVENOUS
  Filled 2020-01-03: qty 2

## 2020-01-03 MED ORDER — IOHEXOL 350 MG/ML SOLN
100.0000 mL | Freq: Once | INTRAVENOUS | Status: AC | PRN
  Administered 2020-01-03: 100 mL via INTRAVENOUS

## 2020-01-03 MED ORDER — PREDNISONE 20 MG PO TABS: 40.0000 mg | ORAL_TABLET | Freq: Every day | ORAL | 0 refills | Status: DC

## 2020-01-03 MED ORDER — HYDROCODONE-ACETAMINOPHEN 5-325 MG PO TABS
1.0000 | ORAL_TABLET | Freq: Once | ORAL | Status: AC
  Administered 2020-01-03: 1 via ORAL
  Filled 2020-01-03: qty 1

## 2020-01-03 NOTE — ED Provider Notes (Signed)
[facility] EMERGENCY DEPARTMENT Provider Note   CSN: [id number] Arrival date & time: 01/03/20  [id number]     History Chief Complaint  Patient presents with  . Flank Pain    [name] is a 69 y.o. female.  HPI Patient presents with right back going to abdominal pain.  Began around 2 weeks ago.  Has an overall constant.  Worse in the morning.  Worse after bowel movements.  No blood in the stool.  No fevers.  Sometimes worse with different positions.  No trauma.  She is on anticoagulation for atrial fibrillation.  Had been seen about a month ago for rectal bleeding diagnosed with hemorrhoids.  No fevers.  Pain is worse after bowel movements but does not improve after that.  Patient states that Tylenol really does not help but she is been taking it.  States that hydrocodone that a friend had does help a lot however think she may need somewhat to go home with.    Past Medical History:  Diagnosis Date  . A-fib ([place])   . Allergy    seasonal  . Asthma   . Chronic anticoagulation   . Eczema   . GERD (gastroesophageal reflux disease)   . History of kidney stones   . Hypertension   . PAF (paroxysmal atrial fibrillation) ([place]) 11/26/2015   April/May 2017: Multiple ED visits for A. Fib.  02/12/16: Improved on diltiazem IV. Cardiology consulted in the ED and discharge patient with diltiazem XR 120 mg once daily with 60 mg by mouth every 6 hours as needed for symptomatic palpitations, flecainide 50 mg twice daily. Referred to the atrial fibrillation clinic.  02/17/16: Seen in the atrial fibrillation clinic by NP Roderic Palau. Normal sin    Patient Active Problem List   Diagnosis Date Noted  . Fall 07/31/2019  . Chest pain 07/23/2019  . Gallstones 12/08/2018  . Choledocholithiasis   . Vaginal discharge 10/11/2018  . Vaginal bleeding 10/11/2018  . History of radiofrequency ablation (RFA) procedure for cardiac arrhythmia   . Asthma exacerbation 08/28/2018  . Chronic  congestion of paranasal sinus 06/14/2018  . Primary osteoarthritis involving multiple joints 06/14/2018  . Acute pain of left knee 05/15/2018  . Hyperpigmented skin lesion 09/09/2017  . Osteoporosis without current pathological fracture 04/15/2017  . Constipation 10/19/2016  . Normocytic anemia 10/19/2016  . Hyperlipidemia 03/18/2016  . Special screening for malignant neoplasms, colon 03/17/2016  . Cervical cancer screening 03/17/2016  . Assistance with transportation 03/17/2016  . HTN (hypertension), benign   . GERD (gastroesophageal reflux disease)   . PAF (paroxysmal atrial fibrillation) ([place]) 11/26/2015  . Chronic anticoagulation 11/26/2015  . Marijuana abuse 06/11/2015  . Hypokalemia 01/07/2012    Past Surgical History:  Procedure Laterality Date  . ATRIAL FIBRILLATION ABLATION N/A 09/30/2017   Procedure: ATRIAL FIBRILLATION ABLATION;  Surgeon: Thompson Grayer, MD;  Location: [place] [facility];  Service: Cardiovascular;  Laterality: N/A;  . CHOLECYSTECTOMY N/A 12/08/2018   Procedure: LAPAROSCOPIC CHOLECYSTECTOMY WITH INTRAOPERATIVE CHOLANGIOGRAM;  Surgeon: Jovita Kussmaul, MD;  Location: [place];  Service: General;  Laterality: N/A;  . ERCP N/A 12/09/2018   Procedure: ENDOSCOPIC RETROGRADE CHOLANGIOPANCREATOGRAPHY (ERCP);  Surgeon: Carol Ada, MD;  Location: [place];  Service: Endoscopy;  Laterality: N/A;  . SPHINCTEROTOMY  12/09/2018   Procedure: SPHINCTEROTOMY;  Surgeon: Carol Ada, MD;  Location: [place];  Service: Endoscopy;;  . TONSILLECTOMY    . TUBAL LIGATION       OB History   No  obstetric history on file.     Family History  Problem Relation Age of Onset  . Heart attack Mother        [name] age  . Breast cancer Sister        Late 59s; now s/p mastectomy   . Lung cancer Brother        x 2  . Cancer Sister   . Cancer Sister   . Cancer Sister   . Colon cancer Neg Hx   . Colon polyps Neg Hx   . Esophageal cancer Neg Hx   . Rectal cancer Neg Hx   .  Stomach cancer Neg Hx     Social History   Tobacco Use  . Smoking status: Former Smoker    Quit date: 10/24/2015    Years since quitting: 4.1  . Smokeless tobacco: Never Used  Substance Use Topics  . Alcohol use: Not Currently  . Drug use: Not Currently    Types: Marijuana    Comment: Smoked marijuana in the past.    Home Medications Prior to Admission medications   Medication Sig Start Date End Date Taking? Authorizing Provider  acetaminophen (TYLENOL) 325 MG tablet Take 650 mg by mouth every 6 (six) hours as needed for mild pain.    [provider]  acetaminophen (TYLENOL) 500 MG tablet Take 2 tablets (1,000 mg total) by mouth every 8 (eight) hours as needed. 05/20/19   Charlesetta Shanks, MD  Capsaicin 0.1 % CREA Apply 1 g topically 3 (three) times daily as needed. 08/01/19   Kathi Ludwig, MD  diltiazem (CARDIZEM) 60 MG tablet Take 1 tablet (60 mg total) by mouth every 6 (six) hours as needed (Rapid AFIB over 100). 07/27/19   Sherran Needs, NP  diltiazem (CARTIA XT) 120 MG 24 hr capsule Take 1 capsule (120 mg total) by mouth 2 (two) times daily. 06/26/19   Axel Filler, MD  docusate sodium (COLACE) 100 MG capsule Take 1 capsule (100 mg total) by mouth 2 (two) times daily. 11/05/19 11/04/20  Earlene Plater, MD  fluticasone (FLOVENT HFA) 44 MCG/ACT inhaler Inhale 1 puff into the lungs daily. 07/10/19   Axel Filler, MD  HYDROcodone-acetaminophen (NORCO/VICODIN) 5-325 MG tablet Take 1-2 tablets by mouth every 6 (six) hours as needed. 01/03/20   Davonna Belling, MD  hydrocortisone (ANUSOL-HC) 25 MG suppository Place 1 suppository (25 mg total) rectally 2 (two) times daily. For 7 days 11/30/19   Margarita Mail, PA-C  levalbuterol [facility] HFA) 45 MCG/ACT inhaler INHALE 1 PUFF INTO THE LUNGS EVERY 6 HOURS AS NEEDED FOR SHORTNESS OF BREATH 07/27/19   Lucious Groves, DO  levalbuterol (XOPENEX) 0.63 MG/3ML nebulizer solution Take 3 mLs (0.63 mg total) by  nebulization every 2 (two) hours as needed for wheezing or shortness of breath. 04/07/19   Tillie Fantasia, MD  linaclotide [facility]) 145 MCG CAPS capsule TAKE 1 CAPSULE(145 MCG) BY MOUTH DAILY BEFORE BREAKFAST 06/26/19   Axel Filler, MD  loratadine (CLARITIN) 10 MG tablet Take 1 tablet (10 mg total) by mouth daily. 11/05/19 11/04/20  Earlene Plater, MD  losartan (COZAAR) 50 MG tablet TAKE 1 TABLET(50 MG) BY MOUTH DAILY 10/10/19   Earlene Plater, MD  magnesium hydroxide (MILK OF MAGNESIA) 400 MG/5ML suspension Take 15-30 mLs by mouth at bedtime as needed for mild constipation. 11/01/17   Harris, Vernie Shanks, PA-C  montelukast (SINGULAIR) 10 MG tablet TAKE 1 TABLET(10 MG) BY MOUTH AT BEDTIME 10/10/19   Earlene Plater, MD  omeprazole [facility])  20 MG capsule Take 1 capsule (20 mg total) by mouth daily. 11/05/19 11/04/20  Earlene Plater, MD  ondansetron (ZOFRAN ODT) 4 MG disintegrating tablet Take 1 tablet (4 mg total) by mouth every 8 (eight) hours as needed for nausea or vomiting. 07/23/19   Jeanmarie Hubert, MD  potassium chloride (KLOR-CON) 10 MEQ tablet Take 2 tablets (20 mEq total) by mouth 2 (two) times daily. 06/26/19 09/25/19  Axel Filler, MD  predniSONE (DELTASONE) 20 MG tablet Take 2 tablets (40 mg total) by mouth daily. 01/03/20   Davonna Belling, MD  rivaroxaban (XARELTO) 20 MG TABS tablet Take 1 tablet (20 mg total) by mouth daily with supper. 11/07/19   Sherran Needs, NP  tiotropium (SPIRIVA) 18 MCG inhalation capsule Place 1 capsule (18 mcg total) into inhaler and inhale daily. 10/29/19   Earlene Plater, MD  triamcinolone cream (KENALOG) 0.1 % Apply 1 application topically 2 (two) times daily. Mix 1:1 with Eucerin 09/15/19   Suella Broad A, PA-C    Allergies    Albuterol, Percocet [oxycodone-acetaminophen], Celecoxib, and Protonix [pantoprazole sodium]  Review of Systems   Review of Systems  Constitutional: Negative for fatigue and fever.  HENT: Positive for  congestion.   Cardiovascular: Negative for chest pain.  Gastrointestinal: Positive for abdominal pain. Negative for diarrhea, nausea and vomiting.  Genitourinary: Negative for dysuria.  Musculoskeletal: Positive for back pain.  Neurological: Negative for weakness.  Psychiatric/Behavioral: Negative for confusion.    Physical Exam Updated Vital Signs BP 140/80 (BP Location: Right Arm)   Pulse 63   Temp 98.5 F (36.9 C) (Oral)   Resp 16   Ht 5\' 7"  (1.702 m)   Wt 68 kg   SpO2 98%   BMI 23.49 kg/m   Physical Exam Vitals and nursing note reviewed.  HENT:     Head: Normocephalic.  Eyes:     Extraocular Movements: Extraocular movements intact.     Pupils: Pupils are equal, round, and reactive to light.  Cardiovascular:     Rate and Rhythm: Regular rhythm.  Pulmonary:     Breath sounds: No wheezing or rhonchi.  Abdominal:     Comments: Right lower quadrant tenderness without rebound or guarding.  No rash.  Musculoskeletal:     Comments: Some tenderness to right lower back also.  No rash.  Skin:    General: Skin is warm.     Capillary Refill: Capillary refill takes less than 2 seconds.  Neurological:     Mental Status: She is alert and oriented to person, place, and time.     ED Results / Procedures / Treatments   Labs (all labs ordered are listed, but only abnormal results are displayed) Labs Reviewed  URINALYSIS, ROUTINE W REFLEX MICROSCOPIC - Abnormal; Notable for the following components:      Result Value   APPearance HAZY (*)    All other components within normal limits  CBC - Abnormal; Notable for the following components:   RDW 16.1 (*)    All other components within normal limits  BASIC METABOLIC PANEL - Abnormal; Notable for the following components:   Glucose, Bld 109 (*)    All other components within normal limits    EKG None  Radiology CT Angio Abd/Pel W and/or Wo Contrast  Result Date: 01/03/2020 CLINICAL DATA:  Right lower quadrant abdominal  pain for EXAM: CTA ABDOMEN AND PELVIS WITH CONTRAST TECHNIQUE: Multidetector CT imaging of the abdomen and pelvis was performed using the standard protocol during bolus administration  of intravenous contrast. Multiplanar reconstructed images and MIPs were obtained and reviewed to evaluate the vascular anatomy. CONTRAST:  164mL OMNIPAQUE IOHEXOL 350 MG/ML SOLN COMPARISON:  11/30/2019 FINDINGS: VASCULAR Aorta: moderate partially calcified plaque throughout. Infrarenal segment 2.7 cm maximum diameter. No dissection or stenosis. Celiac: Patent without evidence of aneurysm, dissection, vasculitis or significant stenosis. SMA: Widely patent. Replaced right hepatic arterial supply, an anatomic variant. Renals: Single left, patent. 3 right renal arteries of similar caliber, all patent IMA: Patent without evidence of aneurysm, dissection, vasculitis or significant stenosis. Inflow: Calcified plaque in bilateral common and proximal internal iliac arteries without aneurysm or stenosis. External iliac arteries patent. Proximal Outflow: Minimal plaque. No significant stenosis. Veins: Patent hepatic veins, portal vein, SMV, splenic vein, bilateral renal veins, IVC, iliac venous system. Circumaortic left renal vein, an anatomic variant. No venous pathology evident. Review of the MIP images confirms the above findings. NON-VASCULAR Lower chest: Dependent atelectasis or scarring posteriorly in the visualized lung bases. No pleural or pericardial effusion. Hepatobiliary: Stable left hepatic cysts. Status post cholecystectomy. Prominent CBD as before. Pancreas: Mildly dilated pancreatic duct throughout its length, stable. No mass or inflammatory change. Spleen: Normal in size without focal abnormality. Adrenals/Urinary Tract: Bilateral adrenal hyperplasia . Kidneys unremarkable. No hydronephrosis. Urinary bladder incompletely distended. Stomach/Bowel: Stomach is nondistended. Distal duodenal small diverticulum. Small bowel is  nondistended. Appendix not discretely identified. Moderate proximal colonic fecal material, decompressed distally without wall thickening or adjacent inflammatory/edematous change. Lymphatic: No abdominal or pelvic adenopathy. Reproductive: Uterus and bilateral adnexa are unremarkable. Other: No ascites. Bilateral pelvic phleboliths. No free air. Musculoskeletal: Multilevel lumbar spondylitic change. Normal alignment. No fracture or worrisome bone lesion. IMPRESSION: No significant proximal mesenteric arterial occlusive disease or acute findings. Aortic Atherosclerosis (ICD10-I70.0). Electronically Signed   By: Lucrezia Europe M.D.   On: 01/03/2020 10:49    Procedures Procedures (including critical care time)  Medications Ordered in ED Medications  HYDROcodone-acetaminophen (NORCO/VICODIN) 5-325 MG per tablet 1 tablet (has no administration in time range)  fentaNYL (SUBLIMAZE) injection 50 mcg (50 mcg Intravenous Given 01/03/20 0856)  iohexol (OMNIPAQUE) 350 MG/ML injection 100 mL (100 mLs Intravenous Contrast Given 01/03/20 0941)    ED Course  I have reviewed the triage vital signs and the nursing notes.  Pertinent labs & imaging results that were available during my care of the patient were reviewed by me and considered in my medical decision making (see chart for details).    MDM Rules/Calculators/A&P                      Patient presents with right-sided flank pain.  Has had for the last couple weeks.  Blood work reassuring.  Urine reassuring.  Has CT scan done including angiography to evaluate due to her A. fib.  No vascular occlusion.  Appendix not visualized but no changes around it and normal white count.  Does have some stool in the area however.  With pain coming from the back and around could be muscularis/nervous from the back.  Will treat with steroids.  Give few pills of pain medicine also.  Patient will continue her bowel protocol that her PCP is managing.  Discharge with PCP  follow-up. Final Clinical Impression(s) / ED Diagnoses Final diagnoses:  Right flank pain    Rx / DC Orders ED Discharge Orders         Ordered    predniSONE (DELTASONE) 20 MG tablet  Daily     01/03/20 1225  HYDROcodone-acetaminophen (NORCO/VICODIN) 5-325 MG tablet  Every 6 hours PRN     01/03/20 1226           Davonna Belling, MD 01/03/20 1234

## 2020-01-03 NOTE — Discharge Instructions (Signed)
Follow-up with your doctor about continued flank pain.  Continue to take your bowel protocol to help with possible constipation.  Take the steroids to help with this is related from your back.

## 2020-01-03 NOTE — ED Triage Notes (Signed)
C/O pain in back lower right side;and right lower abdominal area x 2 weeks.

## 2020-01-07 ENCOUNTER — Other Ambulatory Visit: Payer: Self-pay

## 2020-01-07 ENCOUNTER — Ambulatory Visit (INDEPENDENT_AMBULATORY_CARE_PROVIDER_SITE_OTHER): Payer: Medicare Other | Admitting: Internal Medicine

## 2020-01-07 ENCOUNTER — Telehealth: Payer: Self-pay | Admitting: Internal Medicine

## 2020-01-07 DIAGNOSIS — K5900 Constipation, unspecified: Secondary | ICD-10-CM | POA: Diagnosis not present

## 2020-01-07 MED ORDER — LACTULOSE 10 GM/15ML PO SOLN: 20.0000 g | Freq: Two times a day (BID) | ORAL | 0 refills | Status: DC | PRN

## 2020-01-07 MED ORDER — MELOXICAM 7.5 MG PO TABS: 7.5000 mg | ORAL_TABLET | Freq: Every day | ORAL | 0 refills | Status: AC

## 2020-01-07 NOTE — Assessment & Plan Note (Signed)
Pt has a long hx of constipation. As of late, the patient states that she has been taking milk of magnesia twice a day for the last 7 days.  She is currently having 1 bowel movement every other day.  We discussed how the Vicodin and prednisone are strong medications and will help with pain in the short-term, but are not great medicines long-term for pain management and chronic constipation.  The patient understands that her pain will likely get better once we normalize her bowel movements. -Lactulose 20g (30 mL) BID as needed -15-day supply of Mobic 7.5-15 mg daily -Goal to have 1-2 bowel movement daily

## 2020-01-07 NOTE — Addendum Note (Signed)
Addended by: Earlene Plater on: 01/07/2020 02:39 PM   Modules accepted: Level of Service

## 2020-01-07 NOTE — Progress Notes (Signed)
  [facility] Internal Medicine Residency Telephone Encounter Continuity Care Appointment  HPI:   This telephone encounter was created for [name] on 01/07/2020 for the following purpose/cc abdominal pain.  Pt was seen int he ED on 5/13 for RLQ abdominal pain and back pain. CT angio performed which did not show any occlusion or intrabdominal process. Moderate stool burden was present in the descending colon. Pt was a couple days worth of Norco and prednisone which was helpful, and was told to f/u with her PCP. Her pain level was 10/10 at the ED but decreased to 0 after getting the IV fentanyl, and a short course of Norco and prednisone for a few days. Her pain has returned to a 9/10 today.   Pt states that she has a bowel movement once every other day. Her last bowel movement was yesterday. She states that whenever she has a bowel movement it she feels the pain on her L side. She says she doesn't feel like she is straining much to get it to come out. She also says that she has been taking milk of magnesia twice a day for the last week, but taking no other laxatives.   She denies having any fevers, nausea, vomiting, chest pain. SOB, diarrhea or dysuria.     Past Medical History:  Past Medical History:  Diagnosis Date  . A-fib ([place])   . Allergy    seasonal  . Asthma   . Chronic anticoagulation   . Eczema   . GERD (gastroesophageal reflux disease)   . History of kidney stones   . Hypertension   . PAF (paroxysmal atrial fibrillation) ([place]) 11/26/2015   April/May 2017: Multiple ED visits for A. Fib.  02/12/16: Improved on diltiazem IV. Cardiology consulted in the ED and discharge patient with diltiazem XR 120 mg once daily with 60 mg by mouth every 6 hours as needed for symptomatic palpitations, flecainide 50 mg twice daily. Referred to the atrial fibrillation clinic.  02/17/16: Seen in the atrial fibrillation clinic by NP Roderic Palau. Normal sin      ROS:   All systems were  reviewed and are otherwise negative unless mentioned in HPI.   Assessment / Plan / Recommendations:   Please see A&P under problem oriented charting for assessment of the patient's acute and chronic medical conditions.   As always, pt is advised that if symptoms worsen or new symptoms arise, they should go to an urgent care facility or to to ER for further evaluation.   Consent and Medical Decision Making:   Patient discussed with Dr. Rebeca Alert  This is a telephone encounter between [name] and Earlene Plater on 01/07/2020 for abdominal pain. The visit was conducted with the patient located at home and Earlene Plater at [facility]. The patient's identity was confirmed using their DOB and current address. The patient has consented to being evaluated through a telephone encounter and understands the associated risks (an examination cannot be done and the patient may need to come in for an appointment) / benefits (allows the patient to remain at home, decreasing exposure to coronavirus). I personally spent 15 minutes on medical discussion.

## 2020-01-07 NOTE — Telephone Encounter (Signed)
I called and left a HIPPA compliant message for [name] following our telehealth appointment to let her know I ordered her prescriptions and they should be available to pick up at her pharmacy. I instructed her to reach out to [name] if she had any questions or concerns about the prescriptions.  Earlene Plater, MD Internal Medicine, PGY1 Pager: [phone]  01/07/2020,2:45 PM

## 2020-01-08 ENCOUNTER — Telehealth: Payer: Self-pay | Admitting: Internal Medicine

## 2020-01-08 NOTE — Progress Notes (Signed)
Internal Medicine Clinic Attending  Case discussed with Dr. Eraina Winnie at the time of the visit.  We reviewed the resident's history and exam and pertinent patient test results.  I agree with the assessment, diagnosis, and plan of care documented in the resident's note.  Laporche Martelle, M.D., Ph.D.  

## 2020-01-08 NOTE — Telephone Encounter (Signed)
Return pt's call- stated her doctor had prescribed med for pain (Meloxicam) but she read if you had surgery, it may caused heart attack or stroke so she did not take it. She wants her doctor to call her and prescribe something else. Thanks

## 2020-01-08 NOTE — Telephone Encounter (Signed)
Pls contact pt [phone] regarding medicine

## 2020-01-09 IMAGING — DX DG CHEST 1V PORT
1 series · 1 of 1 positions shown · non-contrast
Comparison: PA and lateral chest 09/05/2018 and 08/21/2017.

CLINICAL DATA: Shortness of breath today.

EXAM:
PORTABLE CHEST 1 VIEW

[chest ap]
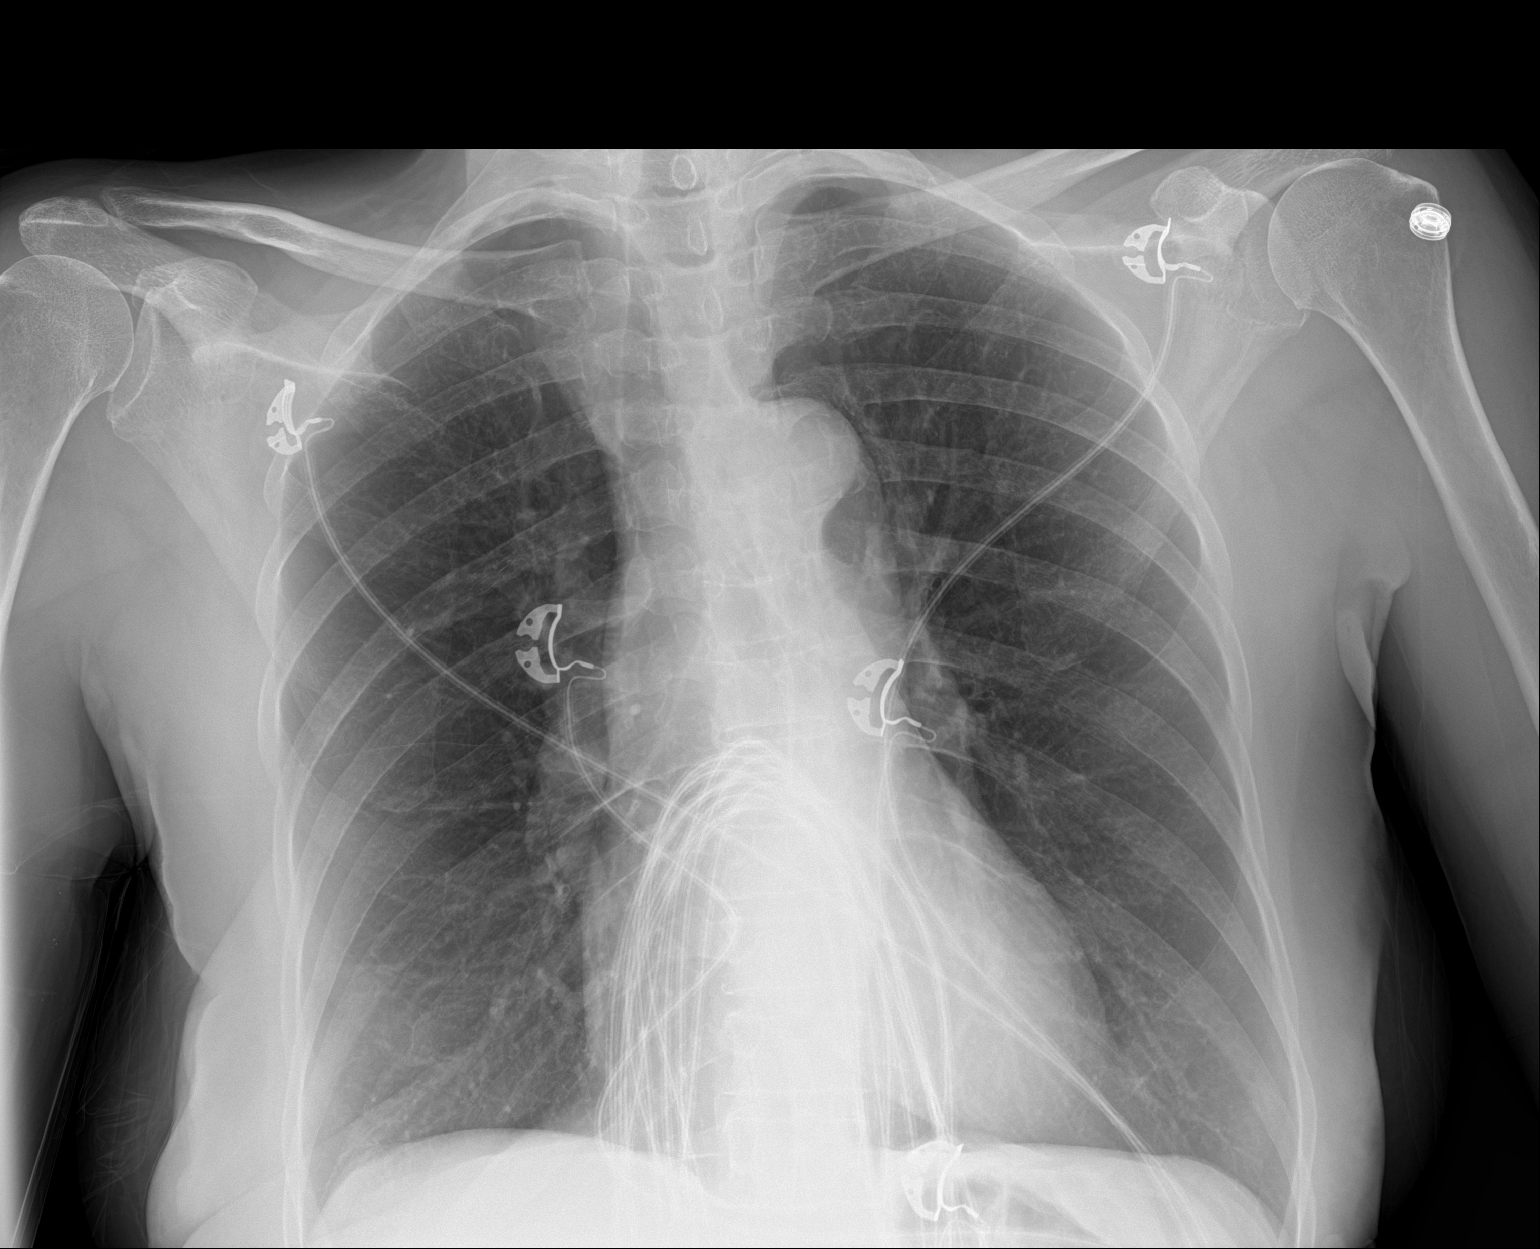

[1 of 1 positions shown; findings below may reference images not displayed]

FINDINGS: Lungs clear. Heart size normal. No pneumothorax or pleural effusion.
No acute or focal bony abnormality.
IMPRESSION: No acute disease.

## 2020-01-09 NOTE — Telephone Encounter (Signed)
Pls is calling back

## 2020-01-09 NOTE — Telephone Encounter (Signed)
Pt called / informed we are waiting on a response from her doctor - she stated "ok".

## 2020-01-11 ENCOUNTER — Telehealth: Payer: Self-pay

## 2020-01-11 NOTE — Telephone Encounter (Signed)
rtc to pt, states she has been awaiting a call from dr Sheppard Coil, she is ask to answer calls when she gets them and not to not answer and call back, she states she will answer, sent dr Sheppard Coil a text to call pt

## 2020-01-11 NOTE — Telephone Encounter (Signed)
Requesting to speak with a nurse about meds, please call pt back.  

## 2020-01-13 ENCOUNTER — Emergency Department (HOSPITAL_COMMUNITY)
Admission: EM | Admit: 2020-01-13 | Discharge: 2020-01-13 | Disposition: A | Discharge status: Home / Self Care | Payer: Medicare Other | Attending: Emergency Medicine | Admitting: Emergency Medicine

## 2020-01-13 ENCOUNTER — Other Ambulatory Visit: Payer: Self-pay

## 2020-01-13 ENCOUNTER — Encounter (HOSPITAL_COMMUNITY): Payer: Self-pay | Admitting: Emergency Medicine

## 2020-01-13 DIAGNOSIS — M545 Low back pain, unspecified: Secondary | ICD-10-CM

## 2020-01-13 DIAGNOSIS — R519 Headache, unspecified: Secondary | ICD-10-CM | POA: Diagnosis present

## 2020-01-13 DIAGNOSIS — Z20822 Contact with and (suspected) exposure to covid-19: Secondary | ICD-10-CM | POA: Insufficient documentation

## 2020-01-13 DIAGNOSIS — R509 Fever, unspecified: Secondary | ICD-10-CM | POA: Diagnosis not present

## 2020-01-13 DIAGNOSIS — Z87891 Personal history of nicotine dependence: Secondary | ICD-10-CM | POA: Insufficient documentation

## 2020-01-13 DIAGNOSIS — J45909 Unspecified asthma, uncomplicated: Secondary | ICD-10-CM | POA: Insufficient documentation

## 2020-01-13 DIAGNOSIS — I10 Essential (primary) hypertension: Secondary | ICD-10-CM | POA: Insufficient documentation

## 2020-01-13 DIAGNOSIS — I480 Paroxysmal atrial fibrillation: Secondary | ICD-10-CM | POA: Diagnosis not present

## 2020-01-13 DIAGNOSIS — J0110 Acute frontal sinusitis, unspecified: Secondary | ICD-10-CM | POA: Insufficient documentation

## 2020-01-13 DIAGNOSIS — Z79899 Other long term (current) drug therapy: Secondary | ICD-10-CM | POA: Diagnosis not present

## 2020-01-13 LAB — SARS CORONAVIRUS 2 BY RT PCR (HOSPITAL ORDER, PERFORMED IN ~~LOC~~ HOSPITAL LAB): SARS Coronavirus 2: NEGATIVE

## 2020-01-13 MED ORDER — AMOXICILLIN-POT CLAVULANATE 875-125 MG PO TABS: 1.0000 | ORAL_TABLET | Freq: Two times a day (BID) | ORAL | 0 refills | Status: DC

## 2020-01-13 MED ORDER — LEVOCETIRIZINE DIHYDROCHLORIDE 5 MG PO TABS: 5.0000 mg | ORAL_TABLET | Freq: Every evening | ORAL | 0 refills | Status: DC

## 2020-01-13 MED ORDER — METHOCARBAMOL 500 MG PO TABS
500.0000 mg | ORAL_TABLET | Freq: Once | ORAL | Status: AC
  Administered 2020-01-13: 500 mg via ORAL
  Filled 2020-01-13: qty 1

## 2020-01-13 MED ORDER — METHOCARBAMOL 500 MG PO TABS: 500.0000 mg | ORAL_TABLET | Freq: Two times a day (BID) | ORAL | 0 refills | Status: DC

## 2020-01-13 NOTE — ED Triage Notes (Signed)
C/o headache, fever, nasal congestion, body aches, sore throat, and back pain x 1 week.

## 2020-01-13 NOTE — Discharge Instructions (Addendum)
Start Augmentin twice daily for 5 days Start Xyzal for congestion. Stop Cetirizine (Zyrtec) Continue Mucinex  You can try Robaxin for muscle and back pain along with a heating pad and Tylenol Please follow up with your PCP

## 2020-01-13 NOTE — ED Provider Notes (Signed)
[facility] EMERGENCY DEPARTMENT Provider Note   CSN: [id number] Arrival date & time: 01/13/20  0703     History Chief Complaint  Patient presents with  . Headache  . Fever  . Sore Throat    [name] is a 69 y.o. female with history of A.fib on Xarelto, allergies, asthma, chronic constipation and GERD who presents with facial pain, congestion, and low back pain. Pt states that for the past 2 weeks she has had a lot of sinus congestion, facial pain, and mucous in her throat. She has been using Mucinex, Zyrtec, and nasal sprays without relief. This morning she woke up and felt hot and her whole body was aching like she had the "flu" so she decided to come to the ED. She reports associated headache, sore throat, chest pain, SOB, cough. She states she has been having a lot of low back pain and R flank pain and was seen in the ED a couple weeks ago for this and told that she was constipated. She was started on Lactulose and stools softeners and she has been having BMs but still has low back pain. She states the Norco really helped her pain. Her doctor prescribed her Mobic but states that this doesn't help and feels like it causes her heart to race. She states she's tried Tylenol, lidocaine patches, heating pads. She has had both COVID vaccinations - last one was in April  HPI     Past Medical History:  Diagnosis Date  . A-fib ([place])   . Allergy    seasonal  . Asthma   . Chronic anticoagulation   . Eczema   . GERD (gastroesophageal reflux disease)   . History of kidney stones   . Hypertension   . PAF (paroxysmal atrial fibrillation) ([place]) 11/26/2015   April/May 2017: Multiple ED visits for A. Fib.  02/12/16: Improved on diltiazem IV. Cardiology consulted in the ED and discharge patient with diltiazem XR 120 mg once daily with 60 mg by mouth every 6 hours as needed for symptomatic palpitations, flecainide 50 mg twice daily. Referred to the atrial fibrillation clinic.   02/17/16: Seen in the atrial fibrillation clinic by NP Roderic Palau. Normal sin    Patient Active Problem List   Diagnosis Date Noted  . Fall 07/31/2019  . Chest pain 07/23/2019  . Gallstones 12/08/2018  . Choledocholithiasis   . Vaginal discharge 10/11/2018  . Vaginal bleeding 10/11/2018  . History of radiofrequency ablation (RFA) procedure for cardiac arrhythmia   . Asthma exacerbation 08/28/2018  . Chronic congestion of paranasal sinus 06/14/2018  . Primary osteoarthritis involving multiple joints 06/14/2018  . Acute pain of left knee 05/15/2018  . Hyperpigmented skin lesion 09/09/2017  . Osteoporosis without current pathological fracture 04/15/2017  . Constipation 10/19/2016  . Normocytic anemia 10/19/2016  . Hyperlipidemia 03/18/2016  . Special screening for malignant neoplasms, colon 03/17/2016  . Cervical cancer screening 03/17/2016  . Assistance with transportation 03/17/2016  . HTN (hypertension), benign   . GERD (gastroesophageal reflux disease)   . PAF (paroxysmal atrial fibrillation) ([place]) 11/26/2015  . Chronic anticoagulation 11/26/2015  . Marijuana abuse 06/11/2015  . Hypokalemia 01/07/2012    Past Surgical History:  Procedure Laterality Date  . ATRIAL FIBRILLATION ABLATION N/A 09/30/2017   Procedure: ATRIAL FIBRILLATION ABLATION;  Surgeon: Thompson Grayer, MD;  Location: [facility];  Service: Cardiovascular;  Laterality: N/A;  . CHOLECYSTECTOMY N/A 12/08/2018   Procedure: LAPAROSCOPIC CHOLECYSTECTOMY WITH INTRAOPERATIVE CHOLANGIOGRAM;  Surgeon: Autumn Messing  III, MD;  Location: [facility];  Service: General;  Laterality: N/A;  . ERCP N/A 12/09/2018   Procedure: ENDOSCOPIC RETROGRADE CHOLANGIOPANCREATOGRAPHY (ERCP);  Surgeon: Carol Ada, MD;  Location: [facility];  Service: Endoscopy;  Laterality: N/A;  . SPHINCTEROTOMY  12/09/2018   Procedure: SPHINCTEROTOMY;  Surgeon: Carol Ada, MD;  Location: [facility];  Service: Endoscopy;;  . TONSILLECTOMY    . TUBAL  LIGATION       OB History   No obstetric history on file.     Family History  Problem Relation Age of Onset  . Heart attack Mother        [name] age  . Breast cancer Sister        Late 52s; now s/p mastectomy   . Lung cancer Brother        x 2  . Cancer Sister   . Cancer Sister   . Cancer Sister   . Colon cancer Neg Hx   . Colon polyps Neg Hx   . Esophageal cancer Neg Hx   . Rectal cancer Neg Hx   . Stomach cancer Neg Hx     Social History   Tobacco Use  . Smoking status: Former Smoker    Quit date: 10/24/2015    Years since quitting: 4.2  . Smokeless tobacco: Never Used  Substance Use Topics  . Alcohol use: Not Currently  . Drug use: Not Currently    Types: Marijuana    Comment: Smoked marijuana in the past.    Home Medications Prior to Admission medications   Medication Sig Start Date End Date Taking? Authorizing Provider  acetaminophen (TYLENOL) 325 MG tablet Take 650 mg by mouth every 6 (six) hours as needed for mild pain.    [provider]  acetaminophen (TYLENOL) 500 MG tablet Take 2 tablets (1,000 mg total) by mouth every 8 (eight) hours as needed. 05/20/19   Charlesetta Shanks, MD  Capsaicin 0.1 % CREA Apply 1 g topically 3 (three) times daily as needed. 08/01/19   Kathi Ludwig, MD  diltiazem (CARDIZEM) 60 MG tablet Take 1 tablet (60 mg total) by mouth every 6 (six) hours as needed (Rapid AFIB over 100). 07/27/19   Sherran Needs, NP  diltiazem (CARTIA XT) 120 MG 24 hr capsule Take 1 capsule (120 mg total) by mouth 2 (two) times daily. 06/26/19   Axel Filler, MD  docusate sodium (COLACE) 100 MG capsule Take 1 capsule (100 mg total) by mouth 2 (two) times daily. 11/05/19 11/04/20  Earlene Plater, MD  fluticasone (FLOVENT HFA) 44 MCG/ACT inhaler Inhale 1 puff into the lungs daily. 07/10/19   Axel Filler, MD  hydrocortisone (ANUSOL-HC) 25 MG suppository Place 1 suppository (25 mg total) rectally 2 (two) times daily. For 7 days  11/30/19   Margarita Mail, PA-C  lactulose (CHRONULAC) 10 GM/15ML solution Take 30 mLs (20 g total) by mouth 2 (two) times daily as needed for mild constipation, moderate constipation or severe constipation. 01/07/20   Earlene Plater, MD  levalbuterol [facility] HFA) 45 MCG/ACT inhaler INHALE 1 PUFF INTO THE LUNGS EVERY 6 HOURS AS NEEDED FOR SHORTNESS OF BREATH 07/27/19   Lucious Groves, DO  levalbuterol (XOPENEX) 0.63 MG/3ML nebulizer solution Take 3 mLs (0.63 mg total) by nebulization every 2 (two) hours as needed for wheezing or shortness of breath. 04/07/19   Tillie Fantasia, MD  linaclotide [facility]) 145 MCG CAPS capsule TAKE 1 CAPSULE(145 MCG) BY MOUTH DAILY BEFORE BREAKFAST 06/26/19   Axel Filler,  MD  loratadine (CLARITIN) 10 MG tablet Take 1 tablet (10 mg total) by mouth daily. 11/05/19 11/04/20  Earlene Plater, MD  losartan (COZAAR) 50 MG tablet TAKE 1 TABLET(50 MG) BY MOUTH DAILY 10/10/19   Earlene Plater, MD  magnesium hydroxide (MILK OF MAGNESIA) 400 MG/5ML suspension Take 15-30 mLs by mouth at bedtime as needed for mild constipation. 11/01/17   Margarita Mail, PA-C  meloxicam (MOBIC) 7.5 MG tablet Take 1-2 tablets (7.5-15 mg total) by mouth daily for 15 doses. 01/07/20 01/22/20  Earlene Plater, MD  montelukast (SINGULAIR) 10 MG tablet TAKE 1 TABLET(10 MG) BY MOUTH AT BEDTIME 10/10/19   Earlene Plater, MD  omeprazole (PRILOSEC) 20 MG capsule Take 1 capsule (20 mg total) by mouth daily. 11/05/19 11/04/20  Earlene Plater, MD  ondansetron (ZOFRAN ODT) 4 MG disintegrating tablet Take 1 tablet (4 mg total) by mouth every 8 (eight) hours as needed for nausea or vomiting. 07/23/19   Jeanmarie Hubert, MD  potassium chloride (KLOR-CON) 10 MEQ tablet Take 2 tablets (20 mEq total) by mouth 2 (two) times daily. 06/26/19 09/25/19  Axel Filler, MD  rivaroxaban (XARELTO) 20 MG TABS tablet Take 1 tablet (20 mg total) by mouth daily with supper. 11/07/19   Sherran Needs, NP   tiotropium (SPIRIVA) 18 MCG inhalation capsule Place 1 capsule (18 mcg total) into inhaler and inhale daily. 10/29/19   Earlene Plater, MD  triamcinolone cream (KENALOG) 0.1 % Apply 1 application topically 2 (two) times daily. Mix 1:1 with Eucerin 09/15/19   Tacy Learn, PA-C    Allergies    Albuterol, Percocet [oxycodone-acetaminophen], Celecoxib, and Protonix [pantoprazole sodium]  Review of Systems   Review of Systems  Constitutional: Positive for fever (subjective). Negative for chills.  HENT: Positive for congestion, sinus pressure and sinus pain. Negative for sore throat and trouble swallowing.   Respiratory: Negative for cough and shortness of breath.   Cardiovascular: Negative for chest pain.  Gastrointestinal: Negative for abdominal pain, nausea and vomiting.  Musculoskeletal: Positive for back pain.  Neurological: Positive for headaches.    Physical Exam Updated Vital Signs BP 129/84 (BP Location: Left Arm)   Pulse 76   Temp 98.4 F (36.9 C) (Oral)   Resp 16   Ht 5\' 7"  (1.702 m)   Wt 68 kg   SpO2 99%   BMI 23.49 kg/m   Physical Exam Vitals and nursing note reviewed.  Constitutional:      General: She is not in acute distress.    Appearance: Normal appearance. She is well-developed. She is not ill-appearing.  HENT:     Head: Normocephalic and atraumatic.     Right Ear: Tympanic membrane normal.     Left Ear: Tympanic membrane normal.     Nose:     Right Sinus: Frontal sinus tenderness present.     Left Sinus: Frontal sinus tenderness present.     Mouth/Throat:     Lips: Pink.     Mouth: Mucous membranes are moist.     Pharynx: Oropharynx is clear.  Eyes:     General: No scleral icterus.       Right eye: No discharge.        Left eye: No discharge.     Conjunctiva/sclera: Conjunctivae normal.     Pupils: Pupils are equal, round, and reactive to light.  Cardiovascular:     Rate and Rhythm: Normal rate and regular rhythm.  Pulmonary:     Effort:  Pulmonary effort is normal. No respiratory  distress.     Breath sounds: Normal breath sounds.  Abdominal:     General: There is no distension.  Musculoskeletal:     Cervical back: Normal range of motion.     Comments: Moves all extremities normally. Ambulatory  Skin:    General: Skin is warm and dry.  Neurological:     Mental Status: She is alert and oriented to person, place, and time.  Psychiatric:        Behavior: Behavior normal.     ED Results / Procedures / Treatments   Labs (all labs ordered are listed, but only abnormal results are displayed) Labs Reviewed - No data to display  EKG None  Radiology No results found.  Procedures Procedures (including critical care time)  Medications Ordered in ED Medications - No data to display  ED Course  I have reviewed the triage vital signs and the nursing notes.  Pertinent labs & imaging results that were available during my care of the patient were reviewed by me and considered in my medical decision making (see chart for details).  69 year old female presents with sinus pain/congestion and low back pain. She is requesting antibiotics and pain medicine. She is well appearing on exam. Vitals are normal. Exam is non-focal. She has tried multiple OTC meds without relief. Will rx Augmentin. She is also asking for something other than Zyrtec - will rx Xyzal.  Regarding her back pain - this is likely an acute on chronic issue. CT from a couple weeks ago noted lumbar spondylosis. Advised we cannot re-prescribe Norco for her today and she needs to see her PCP. She is agreeable to trying a muscle relaxer. COVID test was obtained although lower suspicion since she's been vaccinated.  RICKEYA HEMMERT was evaluated in Emergency Department on 01/13/2020 for the symptoms described in the history of present illness. She was evaluated in the context of the global COVID-19 pandemic, which necessitated consideration that the patient might be at  risk for infection with the SARS-CoV-2 virus that causes COVID-19. Institutional protocols and algorithms that pertain to the evaluation of patients at risk for COVID-19 are in a state of rapid change based on information released by regulatory bodies including the CDC and federal and state organizations. These policies and algorithms were followed during the patient's care in the ED.   MDM Rules/Calculators/A&P                       Final Clinical Impression(s) / ED Diagnoses Final diagnoses:  Acute frontal sinusitis, recurrence not specified  Low back pain without sciatica, unspecified back pain laterality, unspecified chronicity    Rx / DC Orders ED Discharge Orders    None       Recardo Evangelist, PA-C 01/13/20 [id number]    Sherwood Gambler, MD 01/16/20 [phone]

## 2020-01-14 ENCOUNTER — Telehealth: Payer: Self-pay

## 2020-01-14 NOTE — Telephone Encounter (Signed)
Pt is aware covid 19 test is neg on 01-14-2020

## 2020-01-17 NOTE — Telephone Encounter (Signed)
Per Epic, pt was seen in the ER.

## 2020-01-19 IMAGING — DX DG CHEST 1V PORT
1 series · 1 of 1 positions shown · non-contrast
Comparison: 09/23/2018

CLINICAL DATA: Reason for exam: chest pain Patient reports she had
sob and upper chest pains this morning. Sob has gone away after
breathing treatment but reports still feeling upper chest tightness.
Denies any cough.

EXAM:
PORTABLE CHEST 1 VIEW

[chest ap]
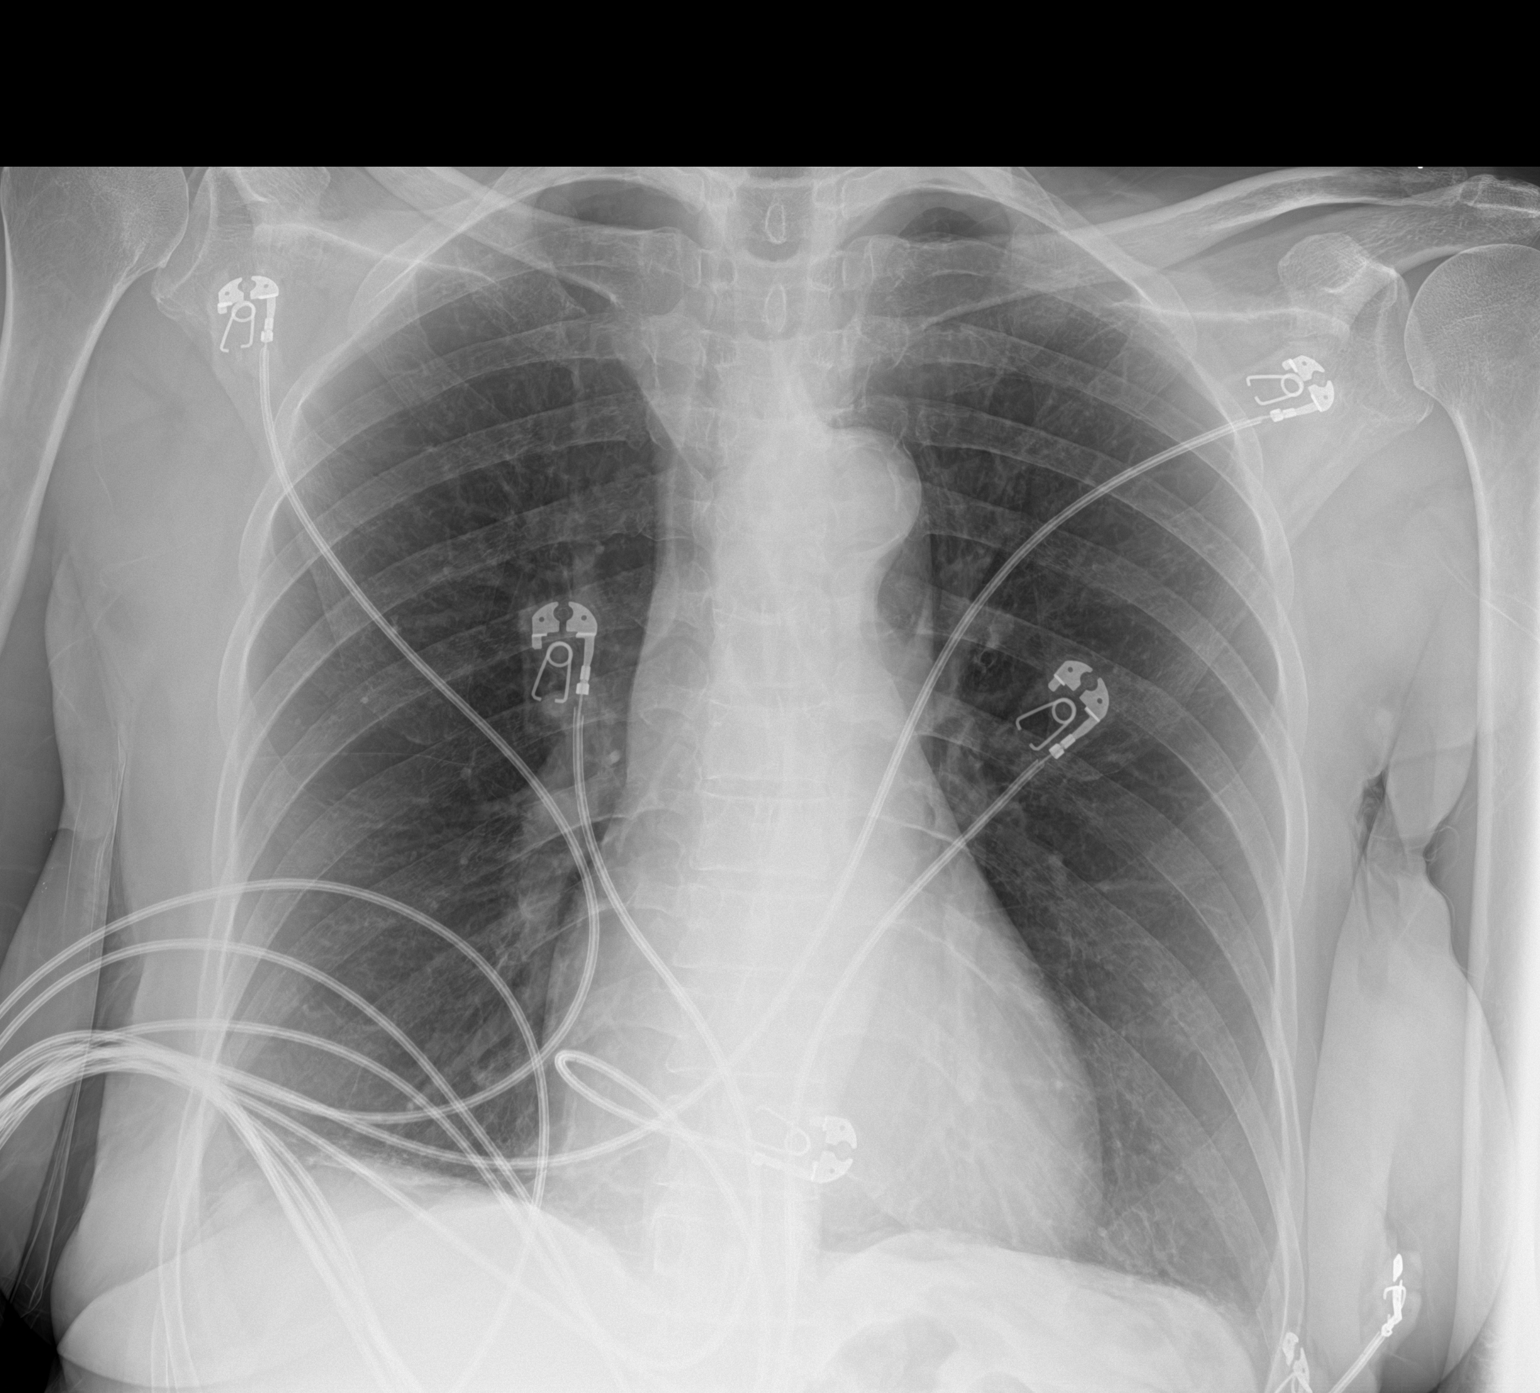

[1 of 1 positions shown; findings below may reference images not displayed]

FINDINGS: Lungs are well inflated. Heart size is mildly enlarged. There are no
focal consolidations or pleural effusions. There is atherosclerotic
calcification of the thoracic aorta.
IMPRESSION: No evidence for acute cardiopulmonary abnormality.

## 2020-01-22 ENCOUNTER — Other Ambulatory Visit (HOSPITAL_COMMUNITY): Payer: Self-pay | Admitting: *Deleted

## 2020-01-22 MED ORDER — DILTIAZEM HCL ER COATED BEADS 120 MG PO CP24: 120.0000 mg | ORAL_CAPSULE | Freq: Two times a day (BID) | ORAL | 2 refills | Status: DC

## 2020-01-25 IMAGING — DX DG CHEST 2V
2 series · 2 of 2 positions shown · non-contrast
Comparison: Radiograph October 03, 2018.

CLINICAL DATA: Chest pain.

EXAM:
CHEST - 2 VIEW

[chest pa]
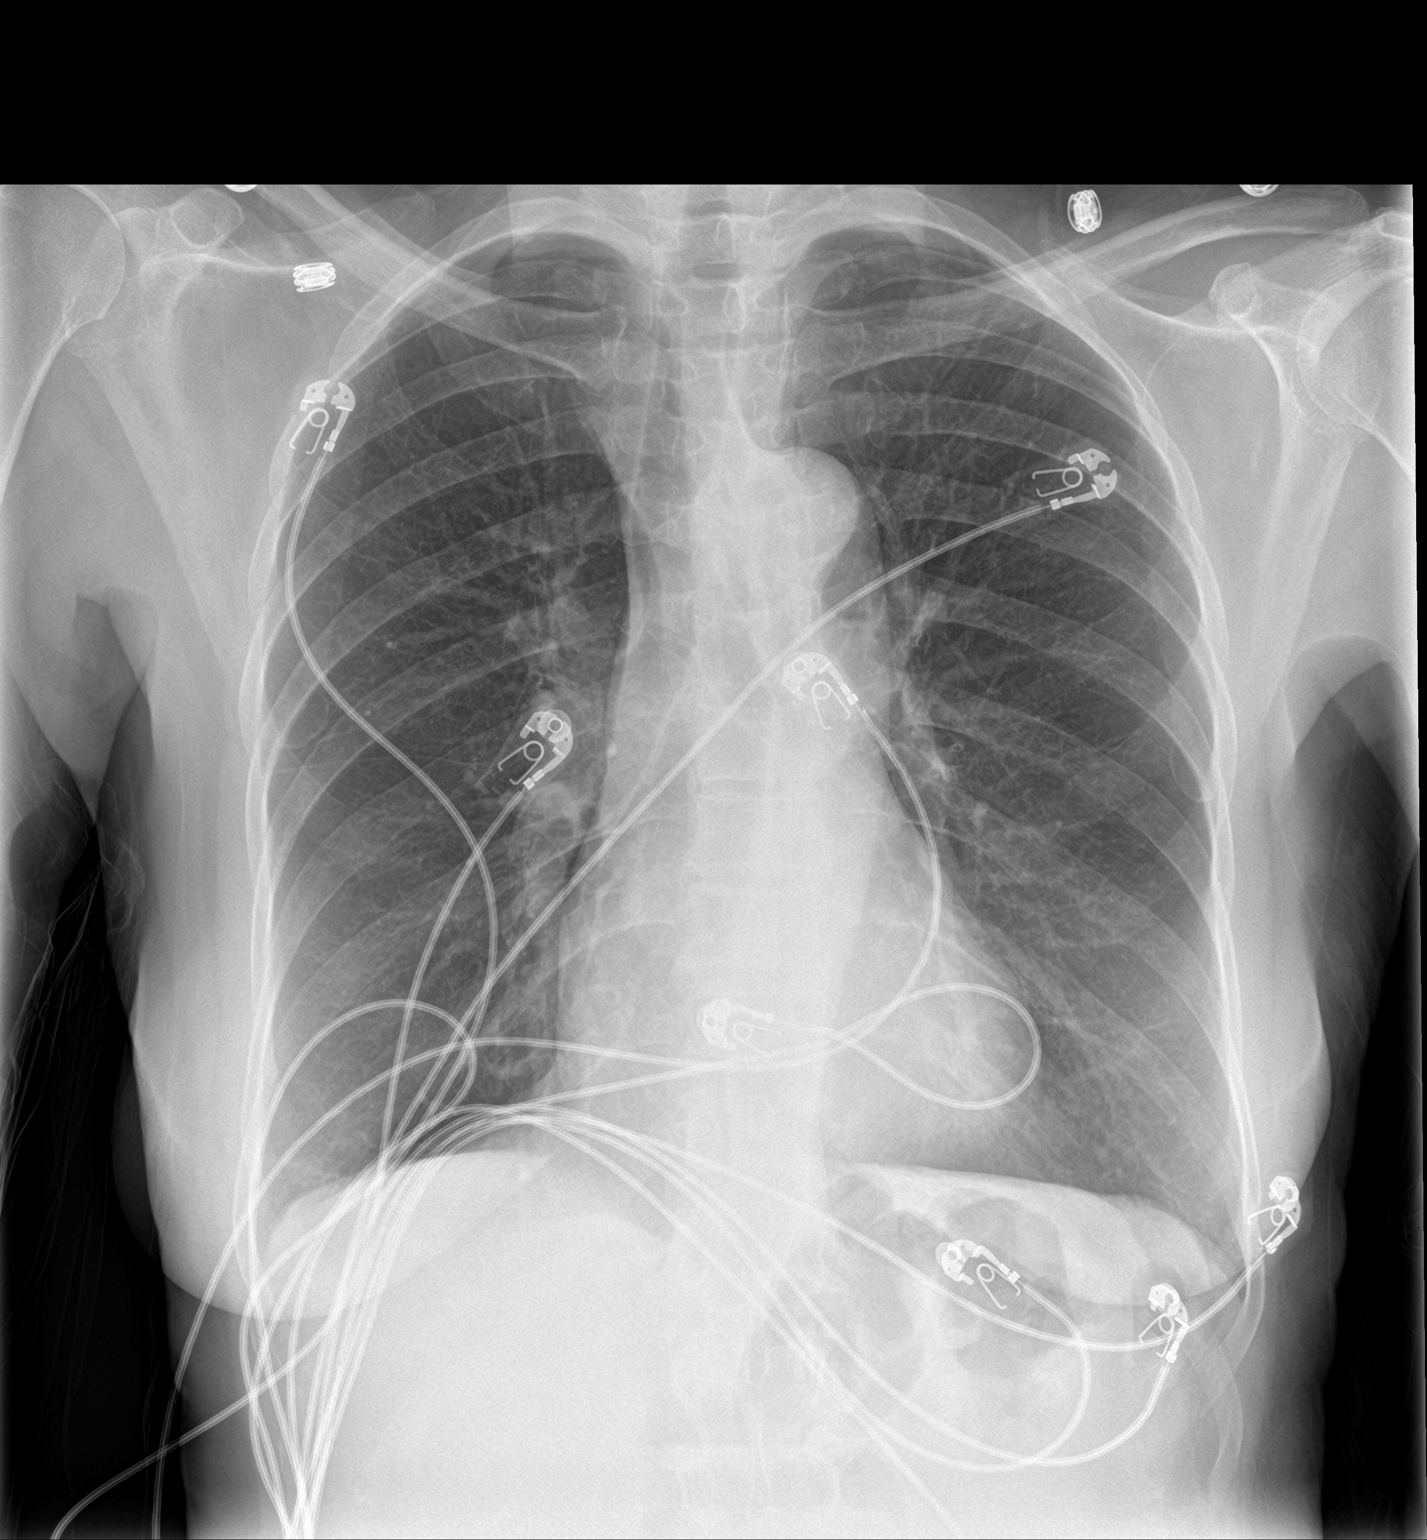

[chest lat]
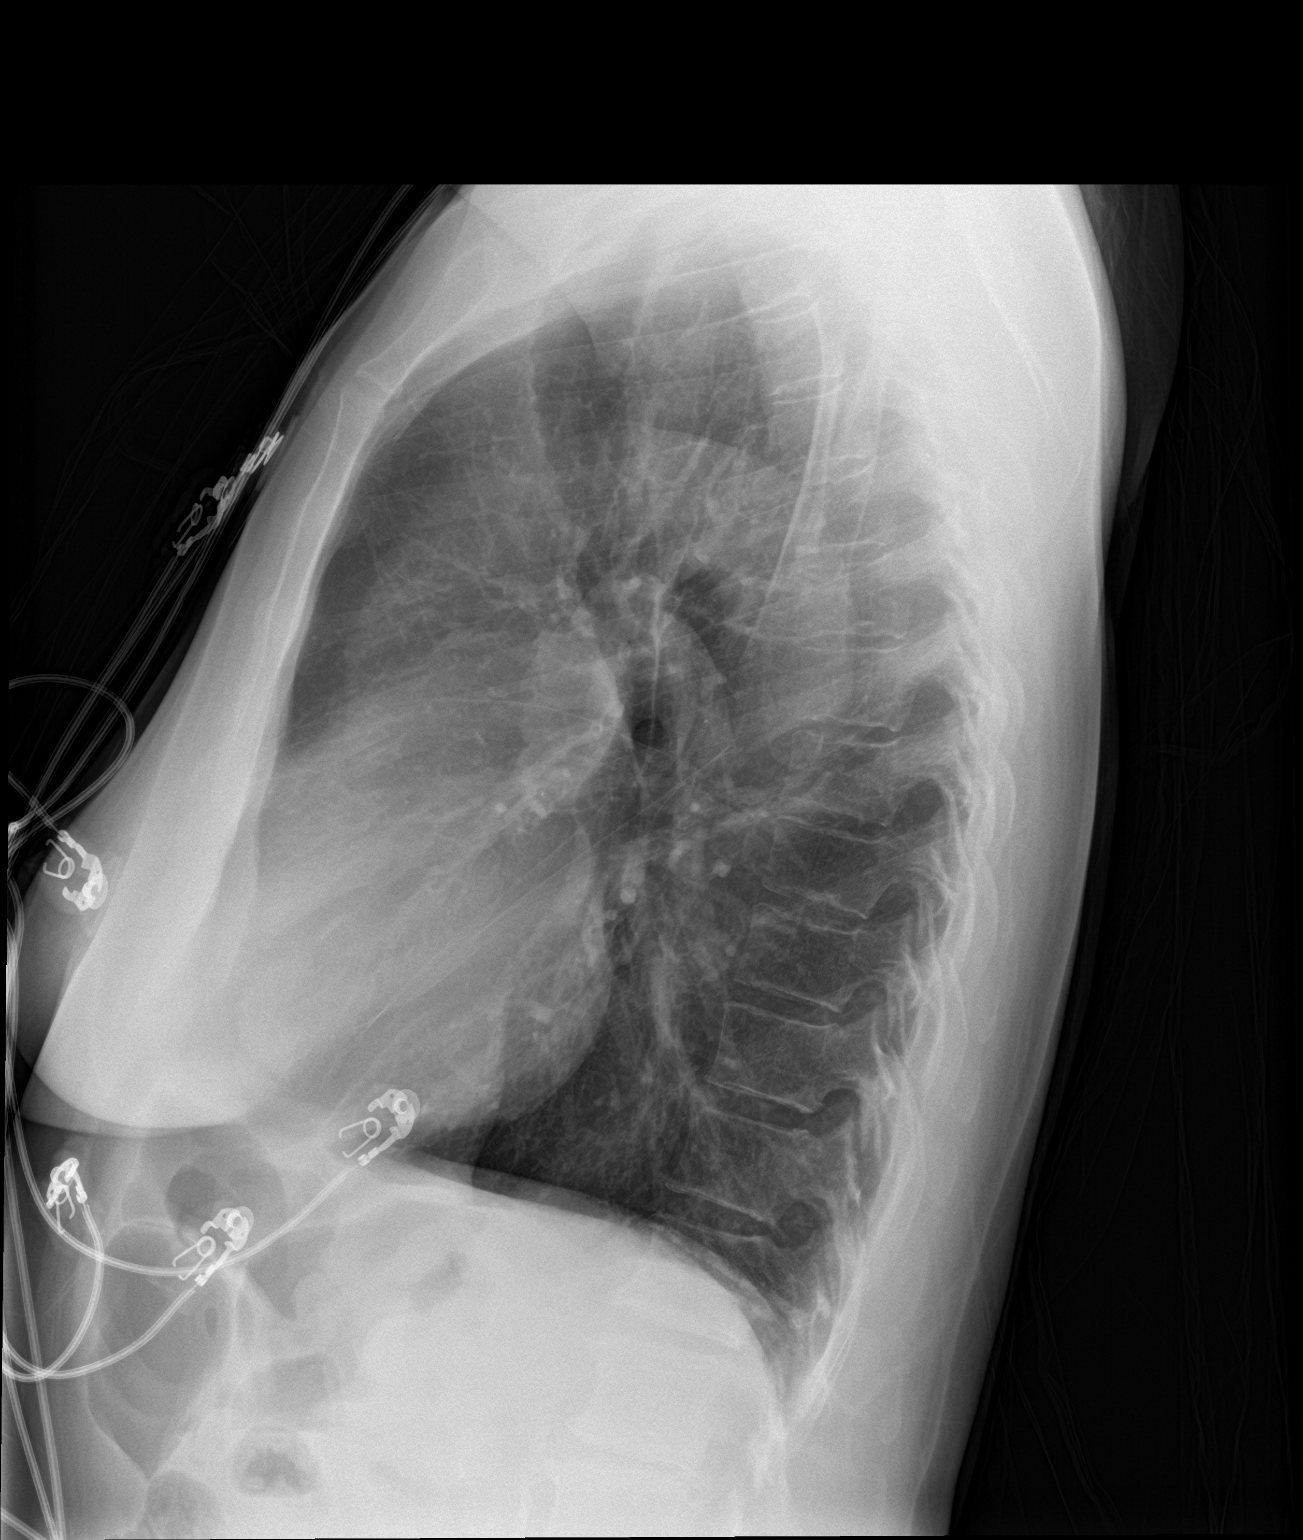

[2 of 2 positions shown; findings below may reference images not displayed]

FINDINGS: The heart size and mediastinal contours are within normal limits.
Both lungs are clear. No pneumothorax or pleural effusion is noted.
The visualized skeletal structures are unremarkable.
IMPRESSION: No active cardiopulmonary disease.

## 2020-01-30 IMAGING — CT CT RENAL STONE PROTOCOL
2 of 4 series · 16 of 46 positions shown, 18 images · non-contrast
Comparison: 02/14/2018 CT abdomen and pelvis.

CLINICAL DATA: 67 y/o F; right flank pain with 1 episode of
vomiting.

EXAM:
CT ABDOMEN AND PELVIS WITHOUT CONTRAST
TECHNIQUE: Multidetector CT imaging of the abdomen and pelvis was performed
following the standard protocol without IV contrast.

[Series 3: ap without · axial · non-contrast · 0.72mm/px · z∈[-391,-66]mm · 13 of 75 slices shown, 15 images]
[im 5/75  soft-tissue]
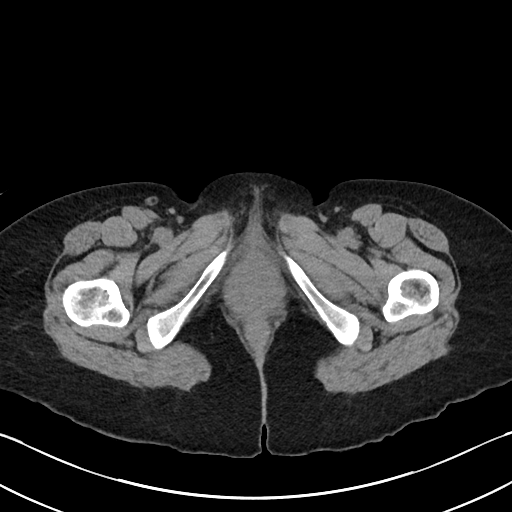
[im 5/75  bone]
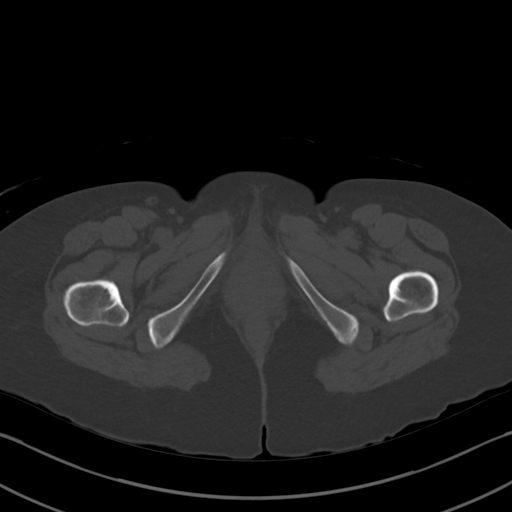
[im 10/75  soft-tissue]
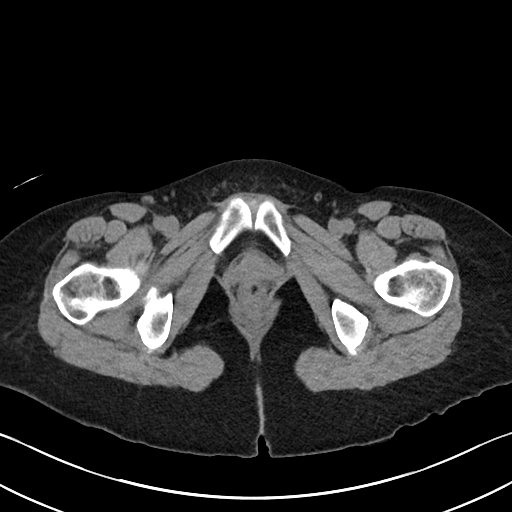
[im 14/75  soft-tissue]
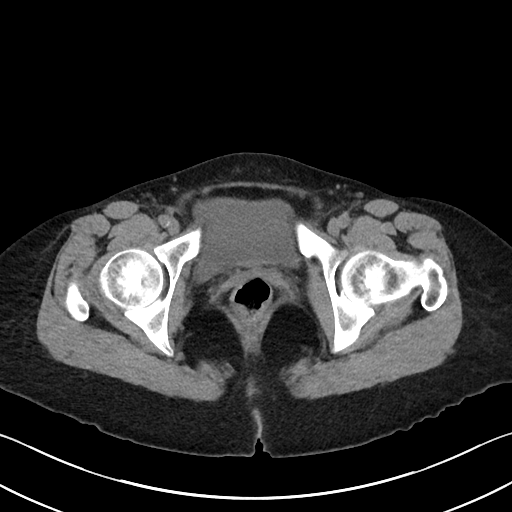
[im 24/75  soft-tissue]
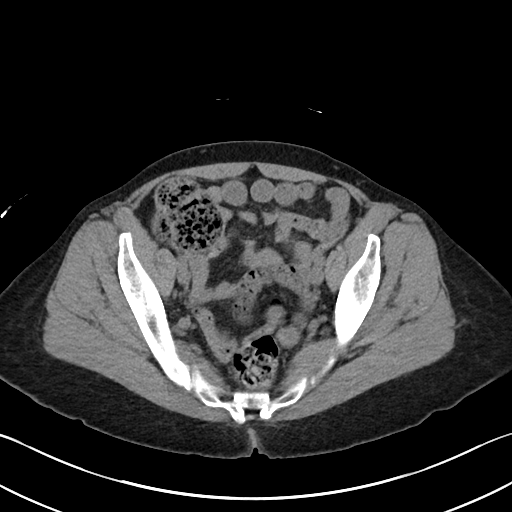
[im 28/75  soft-tissue]
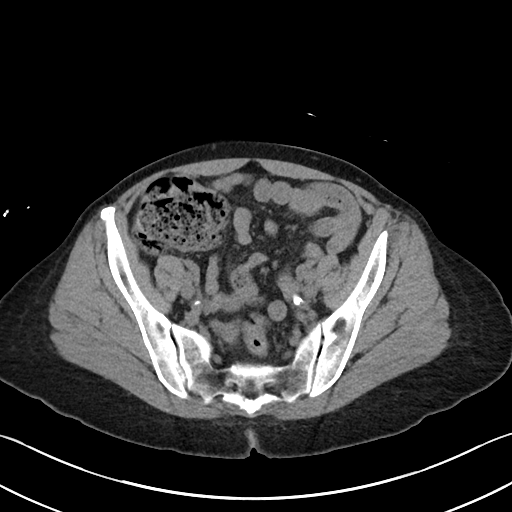
[im 33/75  soft-tissue]
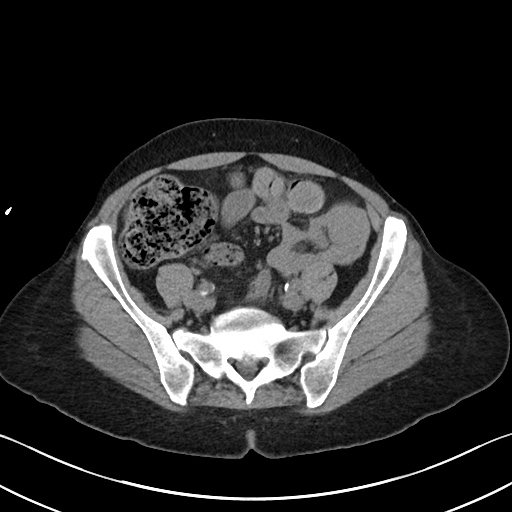
[im 38/75  soft-tissue]
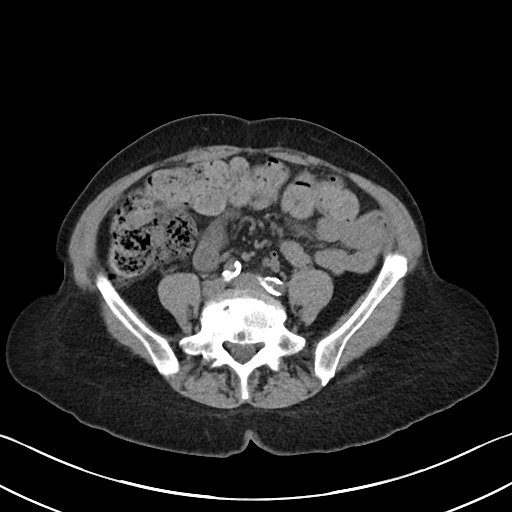
[im 42/75  soft-tissue]
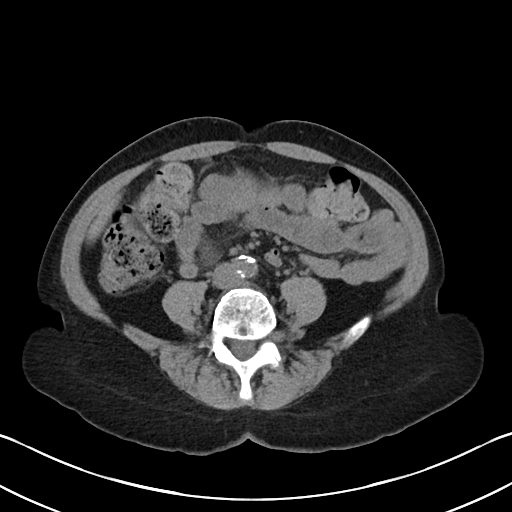
[im 47/75  soft-tissue]
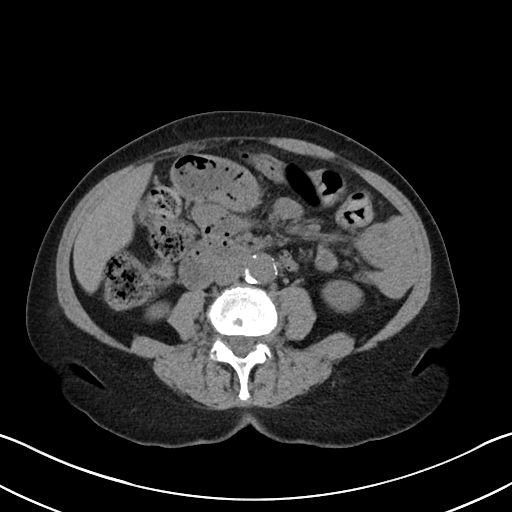
[im 47/75  bone]
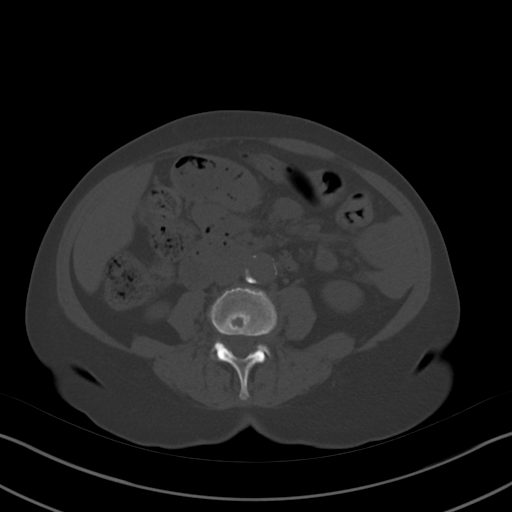
[im 51/75  soft-tissue]
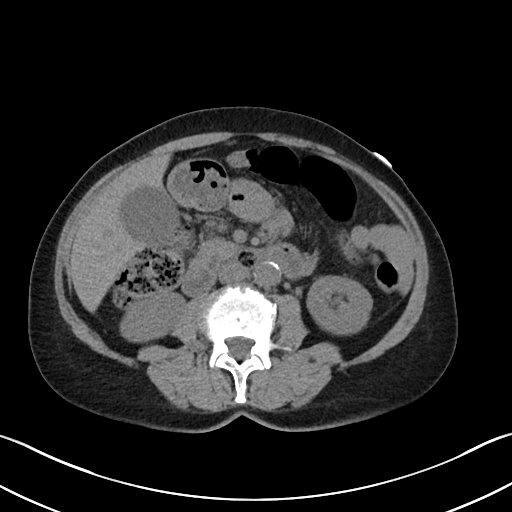
[im 61/75  soft-tissue]
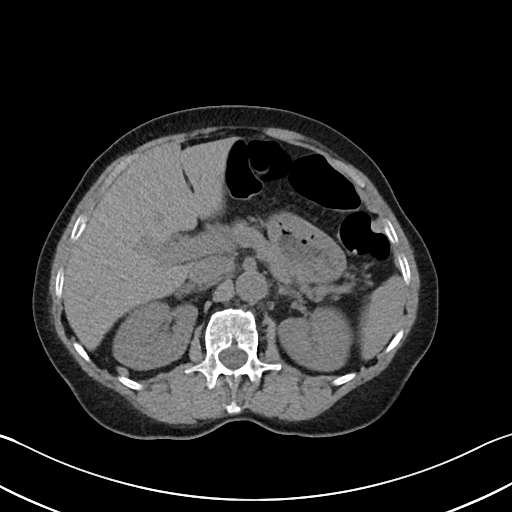
[im 65/75  soft-tissue]
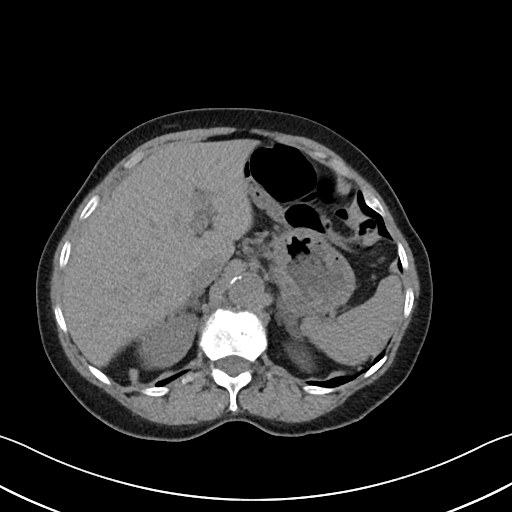
[im 70/75  soft-tissue]
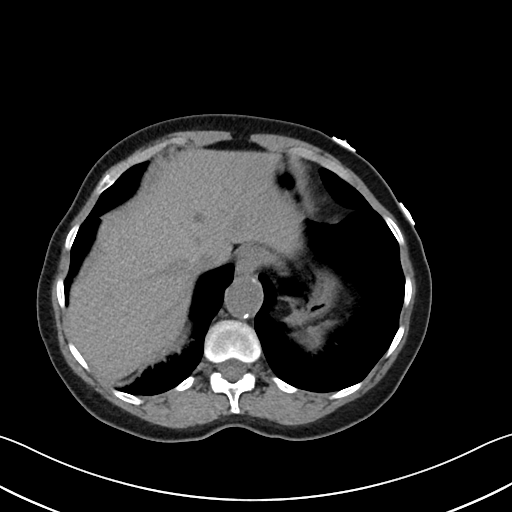

[Series 6: cor · coronal · 0.63mm/px · 3 of 82 slices shown]
[im 28/82  soft-tissue]
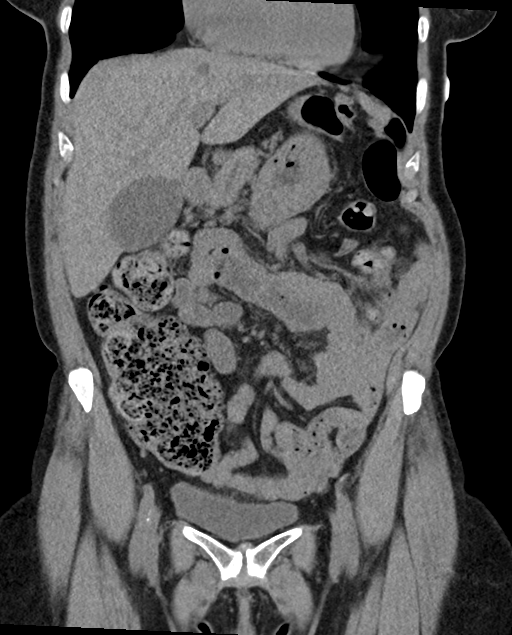
[im 37/82  soft-tissue]
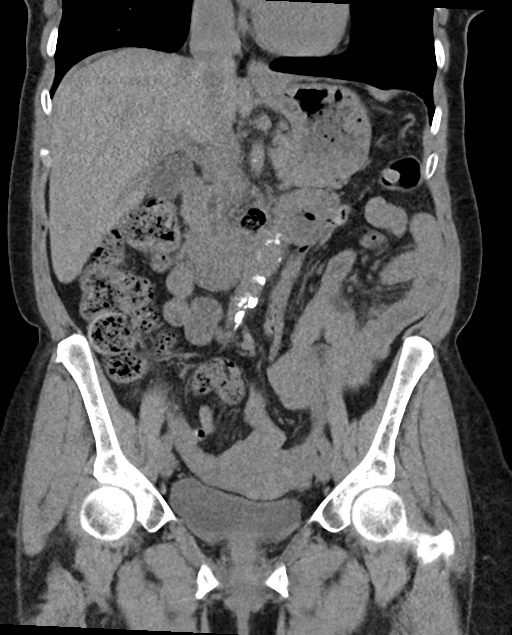
[im 46/82  soft-tissue]
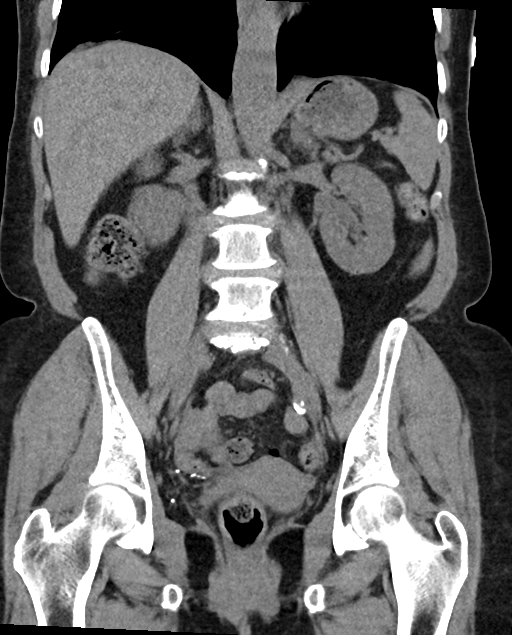

[16 of 46 positions shown; findings below may reference images not displayed]

FINDINGS: Lower chest: No acute abnormality.

Hepatobiliary: Stable liver cysts. Otherwise no focal liver
abnormality is seen. No gallstones, gallbladder wall thickening, or
biliary dilatation.

Pancreas: Unremarkable. No pancreatic ductal dilatation or
surrounding inflammatory changes.

Spleen: Normal in size without focal abnormality.

Adrenals/Urinary Tract: Adrenal glands are unremarkable. Punctate
nonobstructing stone in the lower pole of right kidney (series 6,
image 53). Otherwise kidneys are normal, without additional renal
calculi, focal lesion, or hydronephrosis. Bladder is unremarkable.

Stomach/Bowel: Stomach is within normal limits. Appendix appears
normal. No evidence of bowel wall thickening, distention, or
inflammatory changes.

Vascular/Lymphatic: Aortic atherosclerosis. No enlarged abdominal or
pelvic lymph nodes.

Reproductive: Uterus and bilateral adnexa are unremarkable.

Other: No abdominal wall hernia or abnormality. No abdominopelvic
ascites.

Musculoskeletal: No fracture is seen. Disc and facet degenerative
changes of the spine. No high-grade spinal canal stenosis. Disc
bulges encroach on the neural foramen bilaterally at the L3-L5
levels.
IMPRESSION: 1. Punctate nonobstructing stone in the lower pole of right kidney.
No ureter stone or hydronephrosis identified.
2. Aortic Atherosclerosis (2PM7N-E02.2).

## 2020-02-04 ENCOUNTER — Other Ambulatory Visit: Payer: Self-pay

## 2020-02-04 NOTE — Telephone Encounter (Signed)
Please have the patient schedule an appointment to assess the need for this medication. Thank you.

## 2020-02-04 NOTE — Telephone Encounter (Signed)
methocarbamol (ROBAXIN) 500 MG tablet, REFILL REQUEST @  [facility], [place] AT [address] Phone:  [phone]  Fax:  [phone]

## 2020-02-07 ENCOUNTER — Ambulatory Visit: Payer: Medicare Other

## 2020-02-13 ENCOUNTER — Encounter: Payer: Self-pay | Admitting: *Deleted

## 2020-02-13 ENCOUNTER — Ambulatory Visit: Payer: Medicare Other

## 2020-02-14 ENCOUNTER — Ambulatory Visit (INDEPENDENT_AMBULATORY_CARE_PROVIDER_SITE_OTHER): Payer: Medicare Other | Admitting: Internal Medicine

## 2020-02-14 ENCOUNTER — Other Ambulatory Visit: Payer: Self-pay

## 2020-02-14 VITALS — BP 137/78 | HR 99 | Temp 98.4°F | Wt 144.0 lb

## 2020-02-14 DIAGNOSIS — G8929 Other chronic pain: Secondary | ICD-10-CM | POA: Diagnosis not present

## 2020-02-14 DIAGNOSIS — M545 Low back pain, unspecified: Secondary | ICD-10-CM

## 2020-02-14 DIAGNOSIS — M79604 Pain in right leg: Secondary | ICD-10-CM

## 2020-02-14 DIAGNOSIS — M549 Dorsalgia, unspecified: Secondary | ICD-10-CM | POA: Insufficient documentation

## 2020-02-14 MED ORDER — MELOXICAM 7.5 MG PO TABS: 7.5000 mg | ORAL_TABLET | Freq: Two times a day (BID) | ORAL | 0 refills | Status: DC | PRN

## 2020-02-14 MED ORDER — DICLOFENAC SODIUM 1 % EX GEL: 2.0000 g | Freq: Four times a day (QID) | CUTANEOUS | 0 refills | Status: DC

## 2020-02-14 MED ORDER — METHOCARBAMOL 500 MG PO TABS: 500.0000 mg | ORAL_TABLET | Freq: Two times a day (BID) | ORAL | 0 refills | Status: DC

## 2020-02-14 NOTE — Progress Notes (Signed)
CC: Back and right leg pain  HPI:  [name] is a 69 y.o. history listed below presenting for complaints of back pain and right lower extremity pain.   Patient reports that she has been having back pain since late May, she had been to the ED for constipation. She was complaining of back pain at that time.  She had a CT abdomen pelvis in April of this year that showed multilevel lumbar spondylosis with mild/mod L2-5 neural foraminal narrowing, unchanged from prior.  She was told that she had arthritis in her back and was given some stool softeners and methocarbamol.  She reports that the pain has continued to be present, has not worsened, she has used heating pad and the methocarbamol, reports improvement in the pain.  She reports that she did not have this before.  She has never had physical therapy.  She also endorses a history of right lower leg pain for the past 2 weeks, she does not remember doing excessive exertion or heavy lifting.  She has been wrapping down her leg and using some cream, is not sure what type of cream it is, over the area.  She reports that it is mildly worse in the morning, no specific movements seem to worsen the area.  She denies any fevers, chills, weakness, numbness, tingling, sensation changes, or any other symptoms.  Past Medical History:  Diagnosis Date   A-fib [facility])    Allergy    seasonal   Asthma    Chronic anticoagulation    Eczema    GERD (gastroesophageal reflux disease)    History of kidney stones    Hypertension    PAF (paroxysmal atrial fibrillation) ([place]) 11/26/2015   April/May 2017: Multiple ED visits for A. Fib.  02/12/16: Improved on diltiazem IV. Cardiology consulted in the ED and discharge patient with diltiazem XR 120 mg once daily with 60 mg by mouth every 6 hours as needed for symptomatic palpitations, flecainide 50 mg twice daily. Referred to the atrial fibrillation clinic.  02/17/16: Seen in the atrial fibrillation clinic by NP  Roderic Palau. Normal sin   Review of Systems:   Constitutional: Negative for chills and fever.  Respiratory: Negative for shortness of breath.   Cardiovascular: Negative for chest pain and leg swelling.  Musculoskeletal: Positive for right back pain and right lower leg pain. Gastrointestinal: Negative for abdominal pain, nausea and vomiting.  Neurological: Negative for dizziness and headaches.   Negative for weakness, numbness, or tingling.  Physical Exam:  Vitals:   02/14/20 0828  BP: 137/78  Pulse: 99  Temp: 98.4 F (36.9 C)  TempSrc: Oral  SpO2: 99%  Weight: 144 lb (65.3 kg)   Physical Exam Constitutional:      Appearance: Normal appearance.  HENT:     Head: Normocephalic and atraumatic.     Nose: Nose normal.     Mouth/Throat:     Mouth: Mucous membranes are moist.     Pharynx: Oropharynx is clear.  Eyes:     Pupils: Pupils are equal, round, and reactive to light.  Cardiovascular:     Rate and Rhythm: Normal rate and regular rhythm.     Pulses: Normal pulses.     Heart sounds: Normal heart sounds.  Pulmonary:     Effort: Pulmonary effort is normal.     Breath sounds: Normal breath sounds.  Abdominal:     General: Abdomen is flat. Bowel sounds are normal.     Palpations: Abdomen is soft.  Musculoskeletal:        General: Tenderness (TTP over right lumbosacral muscles) present. Normal range of motion.     Cervical back: Normal range of motion and neck supple.  Skin:    General: Skin is warm and dry.     Capillary Refill: Capillary refill takes less than 2 seconds.  Neurological:     General: No focal deficit present.     Mental Status: She is alert and oriented to person, place, and time.     Sensory: No sensory deficit.     Motor: No weakness.     Gait: Gait normal.  Psychiatric:        Mood and Affect: Mood normal.        Behavior: Behavior normal.      Assessment & Plan:   See Encounters Tab for problem based charting.  Patient discussed with  Dr. Rebeca Alert

## 2020-02-14 NOTE — Assessment & Plan Note (Signed)
Patient reports that she has been having back pain since late May, she had been to the ED for constipation. She was complaining of back pain at that time.  She had a CT abdomen pelvis in April of this year that showed multilevel lumbar spondylosis with mild/mod L2-5 neural foraminal narrowing, unchanged from prior.  She was told that she had arthritis in her back and was given some stool softeners and methocarbamol.  She reports that the pain has continued to be present, has not worsened, she has used heating pad and the methocarbamol, reports improvement in the pain.  She reports that she did not have this before.  She has never had physical therapy.  She also endorses a history of right lower leg pain for the past 2 weeks, she does not remember doing excessive exertion or heavy lifting.  She has been wrapping down her leg and using some cream, is not sure what type of cream it is, over the area.  She reports that it is mildly worse in the morning, no specific movements seem to worsen the area.  She denies any fevers, chills, weakness, numbness, tingling, sensation changes, or any other symptoms.  On exam she does have some tenderness to palpation of the right lumbar sacral area.  No pain to palpation, rash, weakness, or numbness noted in her right lower extremity.  Overall this seems to be more consistent with a muscle spasm/strain with likely radiation down to her right leg.  She does have history of lumbar spondylosis which may be contributing.  Discussed trying her on a NSAID and continuing the methocarbamol.  Also discussed that PT may be beneficial.  She is in agreement with this plan.  -Meloxicam 7.5 twice daily as needed -Voltaren gel -PT -Refill short course of methocarbamol -RTC in 2 to 4 weeks to assess efficacy

## 2020-02-14 NOTE — Patient Instructions (Addendum)
[name],  It was a pleasure to see you today. Thank you for coming in.   Today we discussed your back and right leg pain. I am sorry that you are not feeling well. This seems like it's related to the muscles and your osteoarthritis. Your exam is reassuring today. I have ordered some physical therapy to help improve your pain. I have also sent in a refill for the methocarbamol and have sent in meloxicam for you to take. You can continue using heating pads over that area to decrease the inflammation.    Please return to clinic in 2-4 weeks or sooner if needed.   Thank you again for coming in.   Asencion Noble.D.

## 2020-02-15 NOTE — Progress Notes (Signed)
Internal Medicine Clinic Attending  Case discussed with Dr. Krienke at the time of the visit.  We reviewed the resident's history and exam and pertinent patient test results.  I agree with the assessment, diagnosis, and plan of care documented in the resident's note.  Ian Castagna, M.D., Ph.D.  

## 2020-02-24 IMAGING — MR MR BRAIN/IAC WITHOUT AND WITH CONTRAST
12 of 13 series · 38 of 48 positions shown · IV contrast (15ml multihance)
Comparison: 04/22/2016 head CT

CLINICAL DATA: Bilateral hearing loss.  Frontal headaches

EXAM:
MRI HEAD WITHOUT AND WITH CONTRAST
TECHNIQUE: Multiplanar, multiecho pulse sequences of the brain and surrounding
structures were obtained without and with intravenous contrast.
CONTRAST:  15mL MULTIHANCE GADOBENATE DIMEGLUMINE 529 MG/ML IV SOLN

[Series 2: T1 · sagittal · 5.0mm · 0.45mm/px · 3 of 21 slices shown (1 of 3)]
[im 1/21]
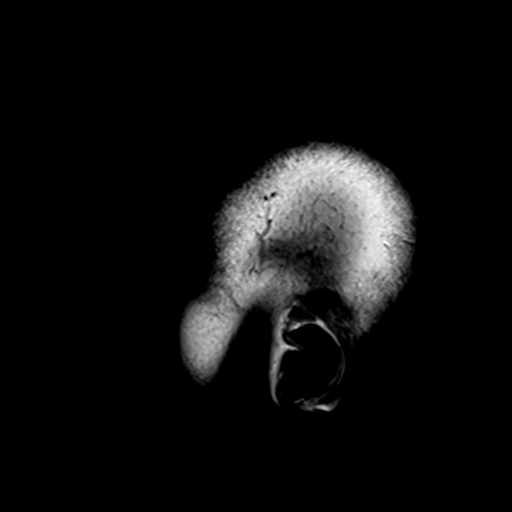
[im 11/21]
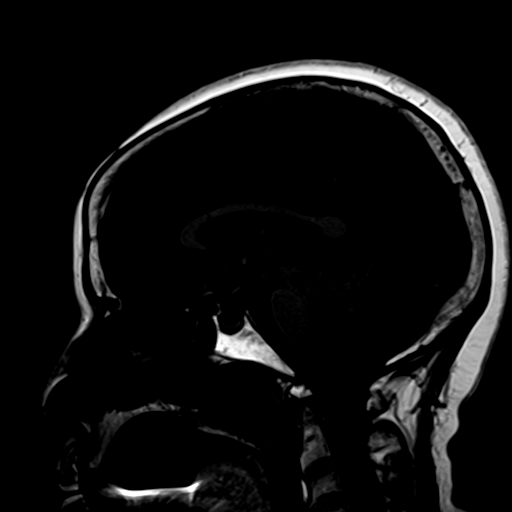
[im 21/21]
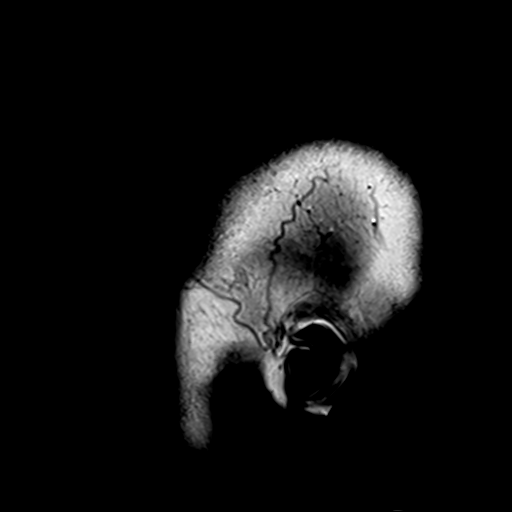

[Series 3: DWI · axial · 3.0mm · 1.80mm/px · z∈[-99,+46]mm · 11 of 100 slices shown (1 of 2)]
[im 1/100]
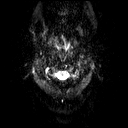
[im 10/100]
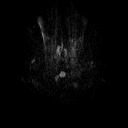
[im 20/100]
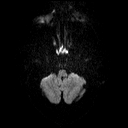
[im 30/100]
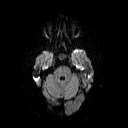
[im 40/100]
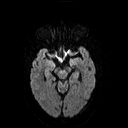
[im 50/100]
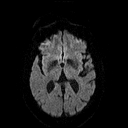
[im 60/100]
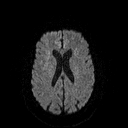
[im 70/100]
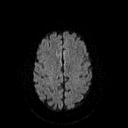
[im 80/100]
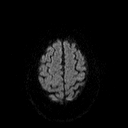
[im 90/100]
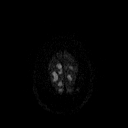
[im 100/100]
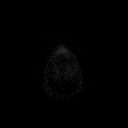

[Series 4: DWI · axial · 3.0mm · 1.80mm/px · z∈[-99,+46]mm · 5 of 45 slices shown (2 of 2)]
[im 1/45]
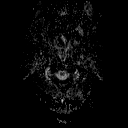
[im 12/45]
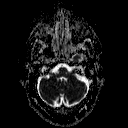
[im 23/45]
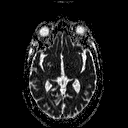
[im 34/45]
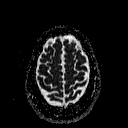
[im 45/45]
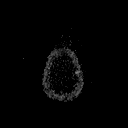

[Series 5: T2 · axial · 5.0mm · 0.45mm/px · z∈[-97,+43]mm · 2 of 23 slices shown]
[im 1/23]
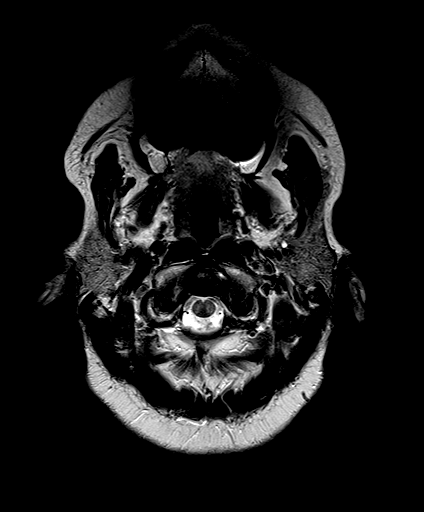
[im 23/23]
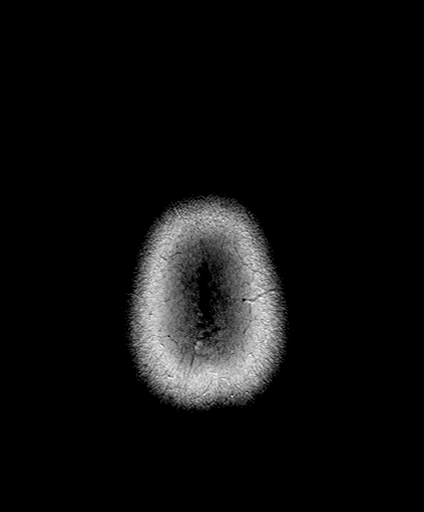

[Series 6: FLAIR · axial · 3.0mm · 0.45mm/px · z∈[-103,+50]mm · 3 of 27 slices shown (1 of 2)]
[im 1/27]
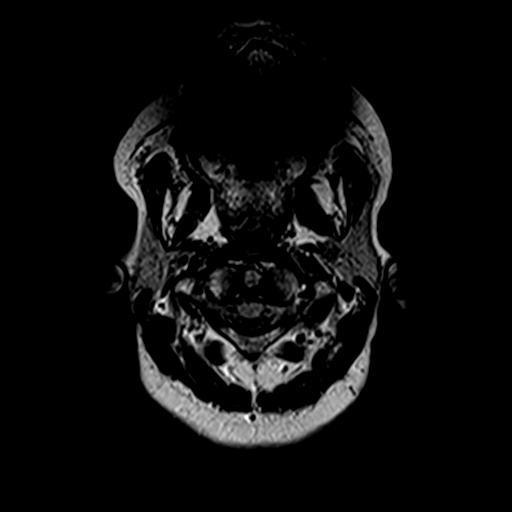
[im 14/27]
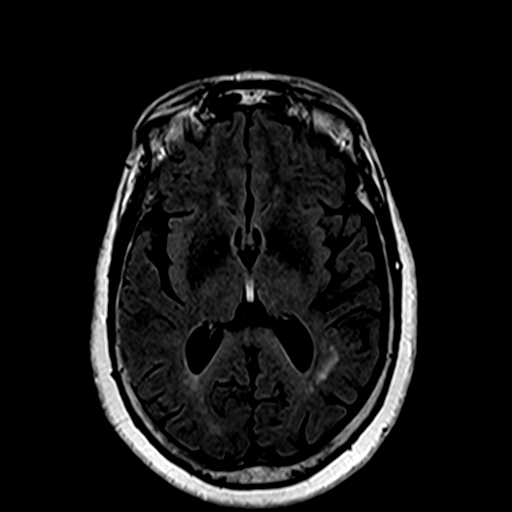
[im 27/27]
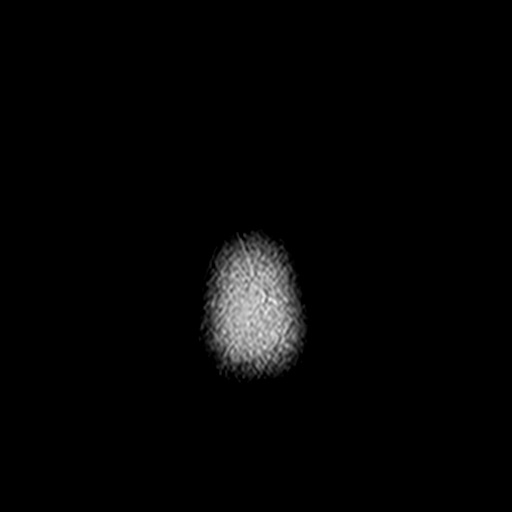

[Series 8: swi_images · axial · 3.0mm · 0.90mm/px · z∈[-97,+42]mm · 5 of 48 slices shown]
[im 1/48]
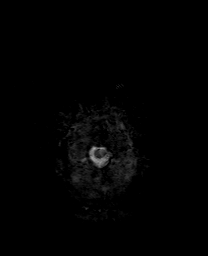
[im 12/48]
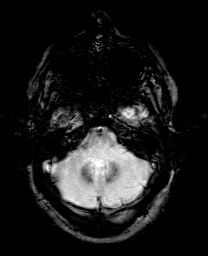
[im 24/48]
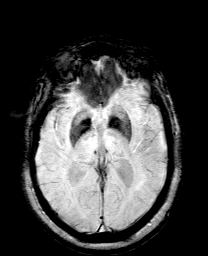
[im 36/48]
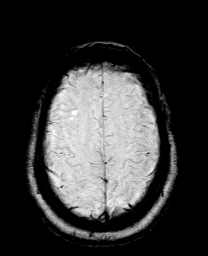
[im 48/48]
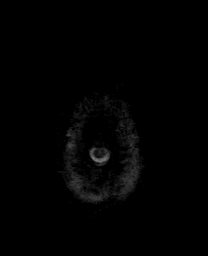

[Series 9: T1 · coronal · 3.0mm · 0.35mm/px · 1 of 13 slices shown (2 of 3)]
[im 1/13]
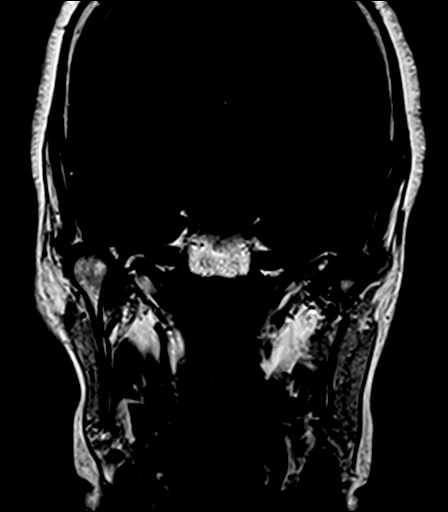

[Series 10: T1 · axial · 3.0mm · 0.35mm/px · 1 of 13 slices shown (3 of 3)]
[im 1/13]
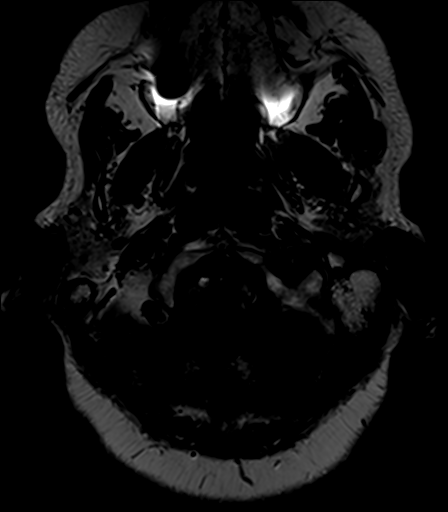

[Series 11: bSSFP · axial · 1.0mm · 0.28mm/px · z∈[-79,-68]mm · 2 of 36 slices shown]
[im 1/36]
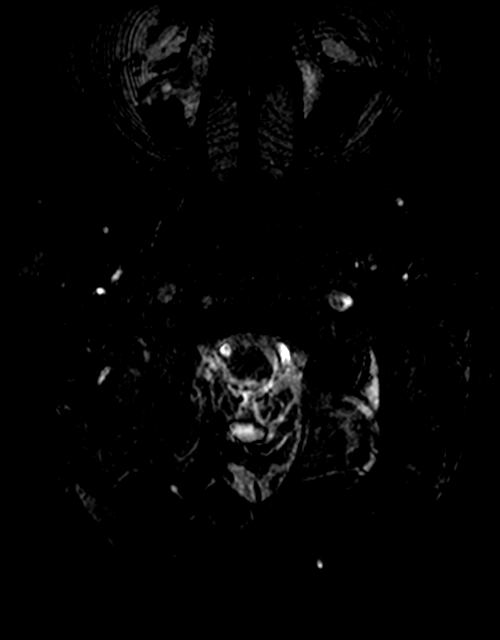
[im 12/36]
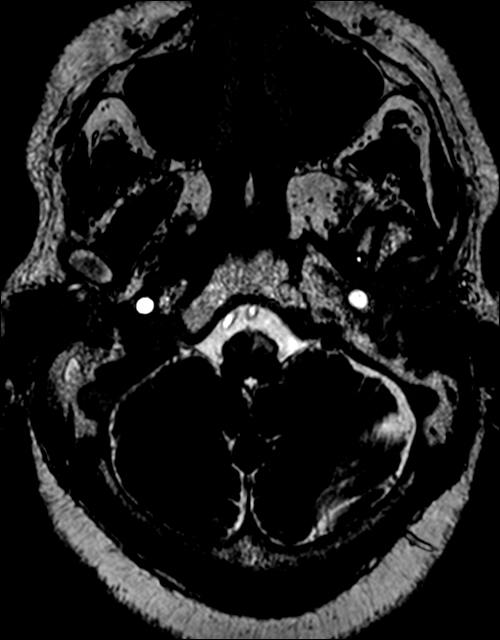

[Series 12: FLAIR · sagittal · 5.0mm · 0.45mm/px · 3 of 25 slices shown (2 of 2)]
[im 1/25]
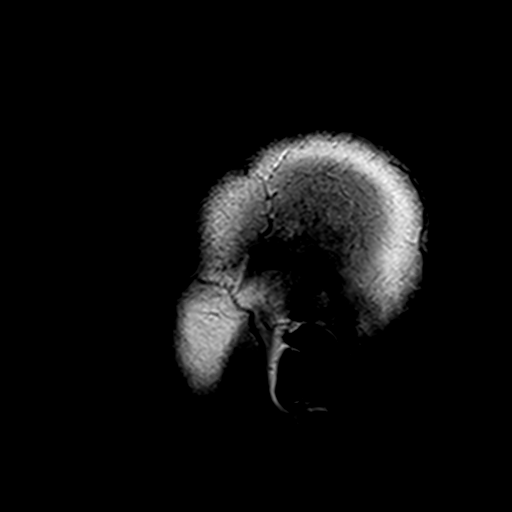
[im 13/25]
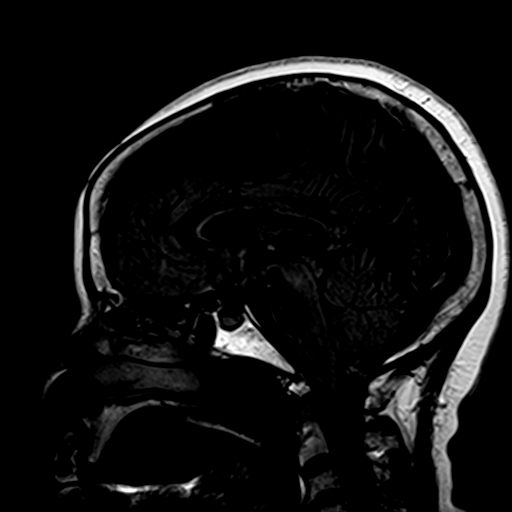
[im 25/25]
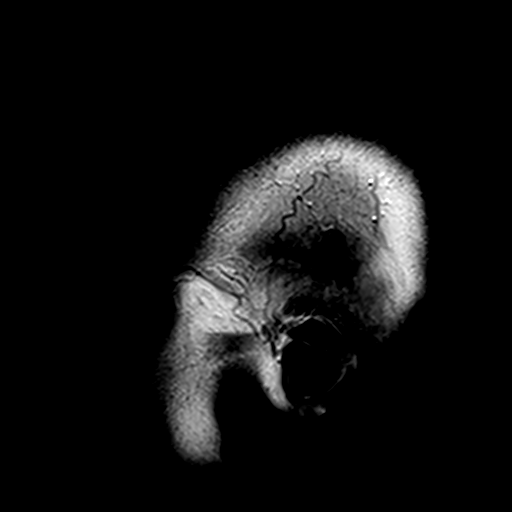

[Series 13: T1 post-contrast · coronal · 3.0mm · 0.35mm/px · 1 of 13 slices shown (1 of 2)]
[im 1/13]
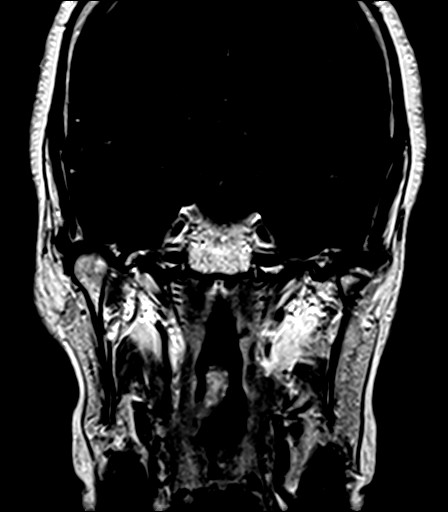

[Series 14: T1 post-contrast · axial · 3.0mm · 0.35mm/px · 1 of 13 slices shown (2 of 2)]
[im 1/13]
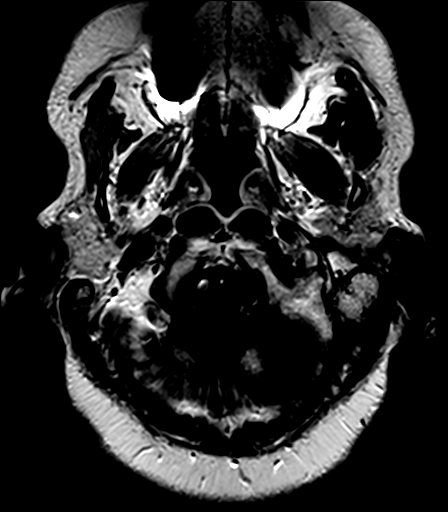

[38 of 48 positions shown; findings below may reference images not displayed]

FINDINGS: Brain: Symmetric normal labyrinthine signal. No mastoid
opacification. Normal appearance of the vestibulocochlear nerve
complex, cisterns, and brainstem. Mild FLAIR hyperintensity in the
cerebral white matter, nonspecific but likely from chronic small
vessel ischemia given patient's vascular risk factors. Brain volume
is normal. No infarct, hemorrhage, hydrocephalus, or masslike
finding. Mild for age cerebral volume loss

Vascular: Major flow voids and vascular enhancements are preserved

Skull and upper cervical spine: Negative for marrow lesion

Sinuses/Orbits: Negative
IMPRESSION: 1. Negative for retrocochlear lesion.
2. Mild presumed chronic small vessel ischemia in the cerebral white
matter.

## 2020-03-06 ENCOUNTER — Ambulatory Visit: Payer: Medicare Other | Admitting: Physical Therapy

## 2020-03-07 ENCOUNTER — Encounter: Payer: Self-pay | Admitting: Physical Therapy

## 2020-03-07 ENCOUNTER — Ambulatory Visit: Payer: Medicare Other | Attending: Internal Medicine | Admitting: Physical Therapy

## 2020-03-07 ENCOUNTER — Other Ambulatory Visit: Payer: Self-pay

## 2020-03-07 DIAGNOSIS — R262 Difficulty in walking, not elsewhere classified: Secondary | ICD-10-CM | POA: Diagnosis not present

## 2020-03-07 DIAGNOSIS — M545 Low back pain, unspecified: Secondary | ICD-10-CM

## 2020-03-07 NOTE — Therapy (Signed)
[place], [place], [address] Phone: [phone]   Fax:  [phone]  Physical Therapy Evaluation  Patient Details  Name: [name] MRN: [id number] Date of Birth: 1951-02-26 Referring Provider (PT): Lonia Skinner, MD   Encounter Date: 03/07/2020   PT End of Session - 03/07/20 1729    Visit Number 1    Number of Visits 12    Date for PT Re-Evaluation 04/18/20    Authorization Type [employer] MCR/MCD    PT Start Time 0930    PT Stop Time 1014    PT Time Calculation (min) 44 min    Activity Tolerance Patient tolerated treatment well    Behavior During Therapy [facility] for tasks assessed/performed           Past Medical History:  Diagnosis Date  . A-fib ([place])   . Allergy    seasonal  . Asthma   . Chronic anticoagulation   . Eczema   . GERD (gastroesophageal reflux disease)   . History of kidney stones   . Hypertension   . PAF (paroxysmal atrial fibrillation) ([place]) 11/26/2015   April/May 2017: Multiple ED visits for A. Fib.  02/12/16: Improved on diltiazem IV. Cardiology consulted in the ED and discharge patient with diltiazem XR 120 mg once daily with 60 mg by mouth every 6 hours as needed for symptomatic palpitations, flecainide 50 mg twice daily. Referred to the atrial fibrillation clinic.  02/17/16: Seen in the atrial fibrillation clinic by NP Roderic Palau. Normal sin    Past Surgical History:  Procedure Laterality Date  . ATRIAL FIBRILLATION ABLATION N/A 09/30/2017   Procedure: ATRIAL FIBRILLATION ABLATION;  Surgeon: Thompson Grayer, MD;  Location: [facility];  Service: Cardiovascular;  Laterality: N/A;  . CHOLECYSTECTOMY N/A 12/08/2018   Procedure: LAPAROSCOPIC CHOLECYSTECTOMY WITH INTRAOPERATIVE CHOLANGIOGRAM;  Surgeon: Jovita Kussmaul, MD;  Location: [facility];  Service: General;  Laterality: N/A;  . ERCP N/A 12/09/2018   Procedure: ENDOSCOPIC RETROGRADE CHOLANGIOPANCREATOGRAPHY (ERCP);  Surgeon:  Carol Ada, MD;  Location: [facility];  Service: Endoscopy;  Laterality: N/A;  . SPHINCTEROTOMY  12/09/2018   Procedure: SPHINCTEROTOMY;  Surgeon: Carol Ada, MD;  Location: [facility];  Service: Endoscopy;;  . TONSILLECTOMY    . TUBAL LIGATION      There were no vitals filed for this visit.    Subjective Assessment - 03/07/20 1720    Subjective Pt. is a 69 y/o female referred to PT with c/o LBP since this past May. No mechanism of injury but had new onset symptoms while dealing with some GI/constipation issues. Pain is in lower lumbar region with pt. noting intermittent radiating into thighs where pain "jumps" between sides bilaterally. Past CT results of multi-level spondylosis and mild-mod stenosis.    Pertinent History GI issues, a-fib, osteoporosis    Limitations Standing;Walking;House hold activities;Lifting    Diagnostic tests CT    Patient Stated Goals get rid of back pain    Currently in Pain? Yes    Pain Score 8     Pain Location Back    Pain Orientation Lower    Pain Descriptors / Indicators Sharp;Throbbing    Pain Type Acute pain    Pain Radiating Towards bilat. thighs    Pain Onset More than a month ago    Pain Frequency Constant    Aggravating Factors  worse with walking/standing    Pain Relieving Factors sitting, topical cream    Effect of Pain on Daily Activities  Limits walking tolerance              [facility] PT Assessment - 03/07/20 0001      Assessment   Medical Diagnosis Acute LBP without sciatica    Referring Provider (PT) Lonia Skinner, MD    Onset Date/Surgical Date 01/03/20    Hand Dominance Right      Precautions   Precautions None      Restrictions   Weight Bearing Restrictions No      Balance Screen   Has the patient fallen in the past 6 months No      [place] residence    Type of Home Apartment    Home Access Level entry    Home Layout One level      Prior Function   Level of  Independence Independent with community mobility without device      Cognition   Overall Cognitive Status Within Functional Limits for tasks assessed      Observation/Other Assessments   Focus on Therapeutic Outcomes (FOTO)  --   not assessed-FOTO not set up at time of eval     Sensation   Light Touch Appears Intact      ROM / Strength   AROM / PROM / Strength AROM;Strength      AROM   Overall AROM Comments Bilat. hip AROM/PROM grossly WFL    AROM Assessment Site Lumbar    Lumbar Flexion 50   increased pain in stomach region   Lumbar Extension 15   increased stomach pain   Lumbar - Right Side Bend 25    Lumbar - Left Side Bend 25    Lumbar - Right Rotation 50%    Lumbar - Left Rotation [facility]      Strength   Strength Assessment Site Hip;Knee;Ankle    Right/Left Hip Right;Left    Right Hip Flexion 4/5    Right Hip Extension 4+/5    Right Hip External Rotation  5/5    Right Hip Internal Rotation 5/5    Right Hip ABduction 5/5    Left Hip Flexion 4/5    Left Hip Extension 4+/5    Left Hip External Rotation 5/5    Left Hip Internal Rotation 5/5    Left Hip ABduction 4+/5    Right/Left Knee Right;Left    Right Knee Flexion 5/5    Right Knee Extension 5/5    Left Knee Flexion 5/5    Left Knee Extension 5/5    Right/Left Ankle Right;Left    Right Ankle Dorsiflexion 5/5    Right Ankle Inversion 5/5    Right Ankle Eversion 5/5    Left Ankle Dorsiflexion 5/5    Left Ankle Inversion 5/5    Left Ankle Eversion 5/5      Flexibility   Soft Tissue Assessment /Muscle Length --   SLR 60 deg bilat. with hamstring tightness     Palpation   Palpation comment TTP bilat. lower lumbar paraspinals      Special Tests   Other special tests SLR (-) bilat.                      Objective measurements completed on examination: See above findings.       [facility] Adult PT Treatment/Exercise - 03/07/20 0001      Exercises   Exercises --   HEP handout review-see chart copy  PT Education - 03/07/20 1729    Education Details symptom etiology, HEP, POC    Person(s) Educated Patient    Methods Explanation;Demonstration;Verbal cues;Handout;Tactile cues    Comprehension Verbalized understanding;Returned demonstration               PT Long Term Goals - 03/07/20 1735      PT LONG TERM GOAL #1   Title Independent with HEP    Baseline needs HEP    Time 6    Period Weeks    Status New    Target Date 04/18/20      PT LONG TERM GOAL #2   Title Increase trunk flexion AROM at least 20 deg to improve ability for bending for chores and activities such as donning shoes    Baseline 50 deg    Time 6    Period Weeks    Status New    Target Date 04/18/20      PT LONG TERM GOAL #3   Title Tolerate standing/ambulation for community mobility and activities such as grocery shopping with LBP 4/10 or less    Baseline pt. rates LBP 8-9/10    Time 6    Period Weeks    Status New    Target Date 04/18/20      PT LONG TERM GOAL #4   Title Increase bilat. hip extension strength 1/2 MMT grade to improve ability for lifting for chores and transfers from low seats    Baseline 4+/5    Time 6    Period Weeks    Status New    Target Date 04/18/20                  Plan - 03/07/20 1730    Clinical Impression Statement Pt. presents with new onset lumbar pain symptoms with flexion bias for trunk ROM as well as hip weakness and LE muscle tightness. Flexion bias for ROM consistent with imaging findings of underlying degenerative changes-leg symptoms somewhat unclear in etiology but potentially radicular. GI/constipation issues may be contributing factors to muscle tightness/spasm. Pt. would benefit from PT to help relieve pain and address associated functional limitations.    Personal Factors and Comorbidities Comorbidity 2;Age    Comorbidities See PMH    Examination-Activity Limitations Squat;Lift;Stairs;Locomotion Level;Stand     Examination-Participation Restrictions Community Activity;Shop;Laundry;Cleaning    Stability/Clinical Decision Making Evolving/Moderate complexity    Clinical Decision Making Moderate    Rehab Potential Good    PT Frequency 2x / week    PT Duration 6 weeks    PT Treatment/Interventions ADLs/Self Care Home Management;Cryotherapy;Electrical Stimulation;Moist Heat;Functional mobility training;Therapeutic activities;Therapeutic exercise;Patient/family education;Manual techniques;Neuromuscular re-education;Dry needling;Taping    PT Next Visit Plan review HEP as needed, continue/progress flexion bias lumbar ROM and core strengthening, hamstring flexibiity, check hip flexors    PT Home Exercise Plan [id number]    Consulted and Agree with Plan of Care Patient           Patient will benefit from skilled therapeutic intervention in order to improve the following deficits and impairments:  Pain, Impaired flexibility, Decreased strength, Decreased activity tolerance, Decreased range of motion, Difficulty walking  Visit Diagnosis: Acute bilateral low back pain without sciatica  Difficulty in walking, not elsewhere classified     Problem List Patient Active Problem List   Diagnosis Date Noted  . Lower back pain 02/14/2020  . Fall 07/31/2019  . Chest pain 07/23/2019  . Gallstones 12/08/2018  . Choledocholithiasis   . Vaginal discharge 10/11/2018  .  Vaginal bleeding 10/11/2018  . History of radiofrequency ablation (RFA) procedure for cardiac arrhythmia   . Asthma exacerbation 08/28/2018  . Chronic congestion of paranasal sinus 06/14/2018  . Primary osteoarthritis involving multiple joints 06/14/2018  . Acute pain of left knee 05/15/2018  . Hyperpigmented skin lesion 09/09/2017  . Osteoporosis without current pathological fracture 04/15/2017  . Constipation 10/19/2016  . Normocytic anemia 10/19/2016  . Hyperlipidemia 03/18/2016  . Special screening for malignant neoplasms, colon 03/17/2016    . Cervical cancer screening 03/17/2016  . Assistance with transportation 03/17/2016  . HTN (hypertension), benign   . GERD (gastroesophageal reflux disease)   . PAF (paroxysmal atrial fibrillation) ([place]) 11/26/2015  . Chronic anticoagulation 11/26/2015  . Marijuana abuse 06/11/2015  . Hypokalemia 01/07/2012    Beaulah Dinning, PT, DPT 03/07/20 5:40 PM  [facility] [address] [place], [place], [address] Phone: [phone]   Fax:  [phone]  Name: [name] MRN: [id number] Date of Birth: July 25, 1951

## 2020-03-08 IMAGING — US ULTRASOUND ABDOMEN LIMITED
1 series · 14 of 25 positions shown · non-contrast
Comparison: CT scan of October 14, 2018 and February 14, 2018.

CLINICAL DATA: Right upper quadrant abdominal pain.

EXAM:
ULTRASOUND ABDOMEN LIMITED RIGHT UPPER QUADRANT

[Series 1: ultrasound abdomen limited · 14 of 36 slices shown]
[im 1/36]
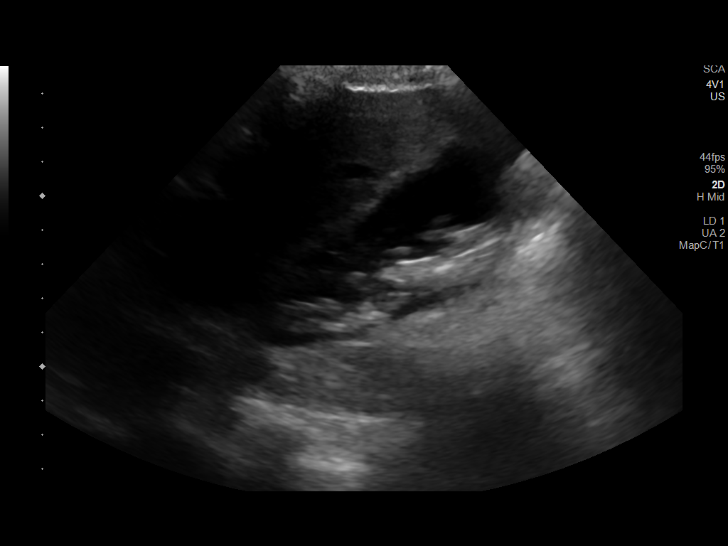
[im 3/36]
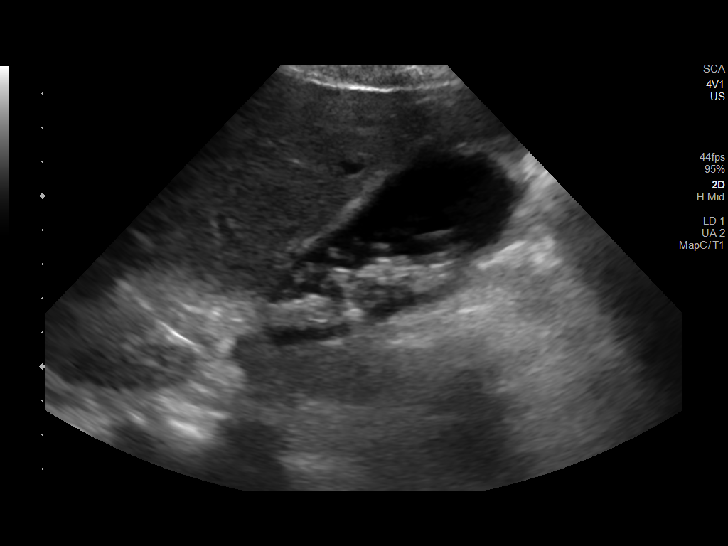
[im 6/36]
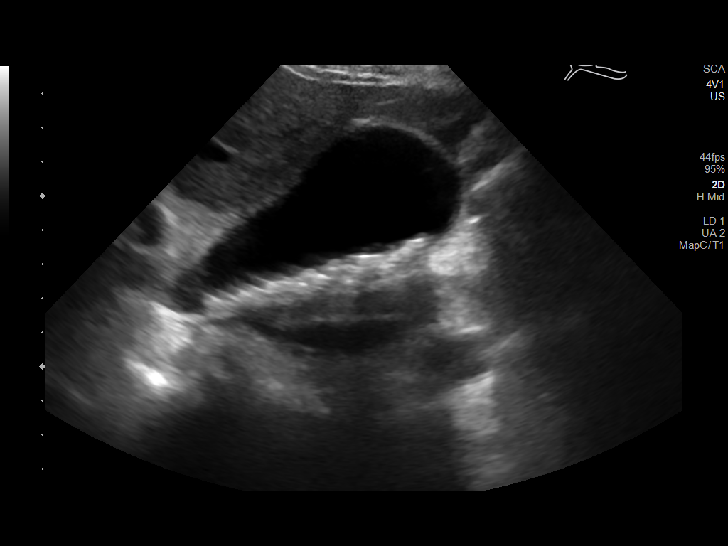
[im 9/36]
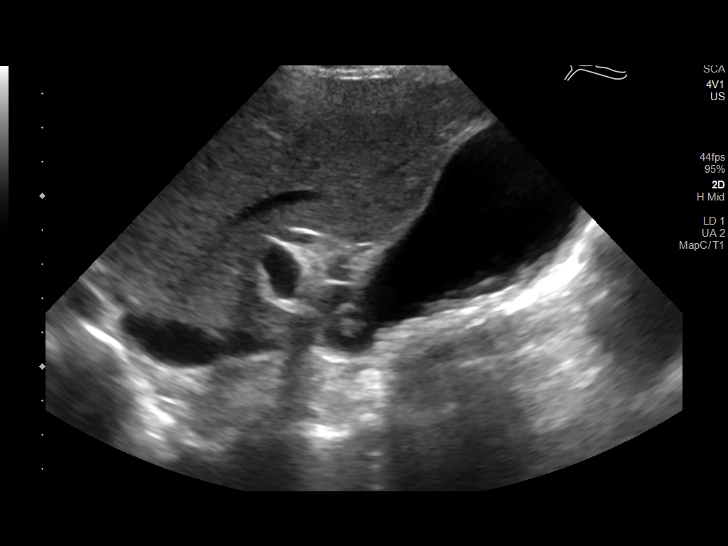
[im 12/36]
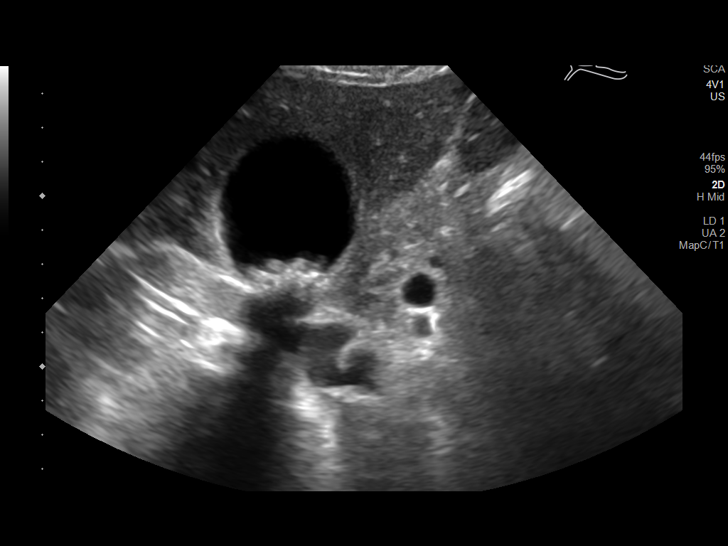
[im 14/36]
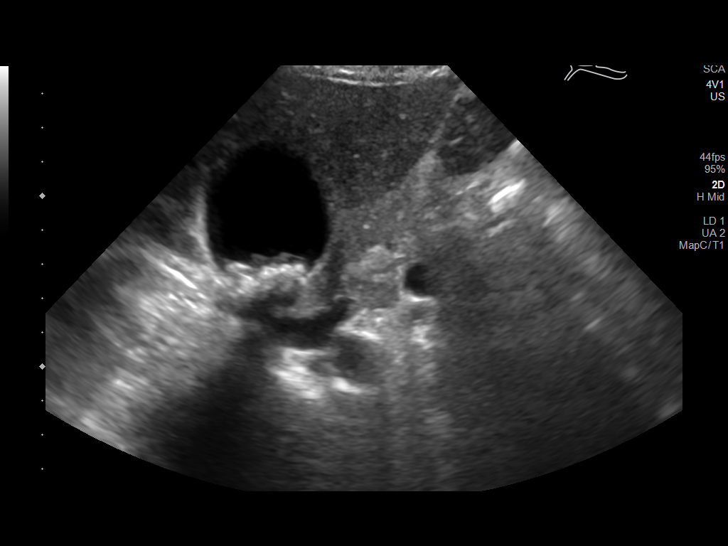
[im 17/36]
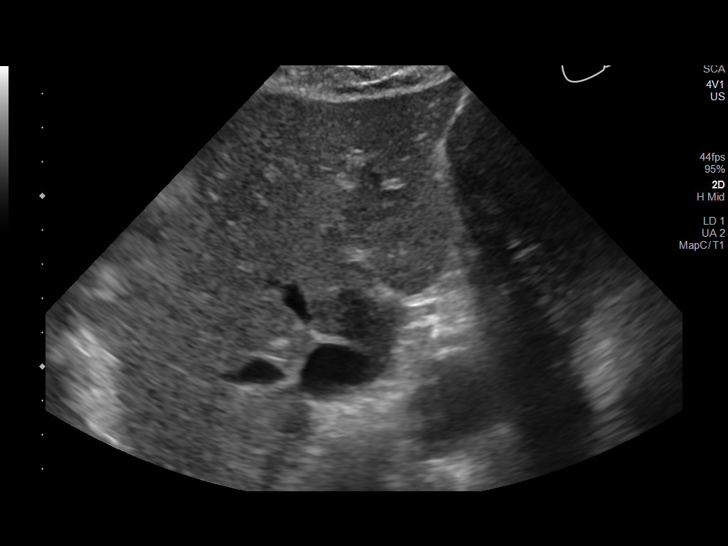
[im 19/36]
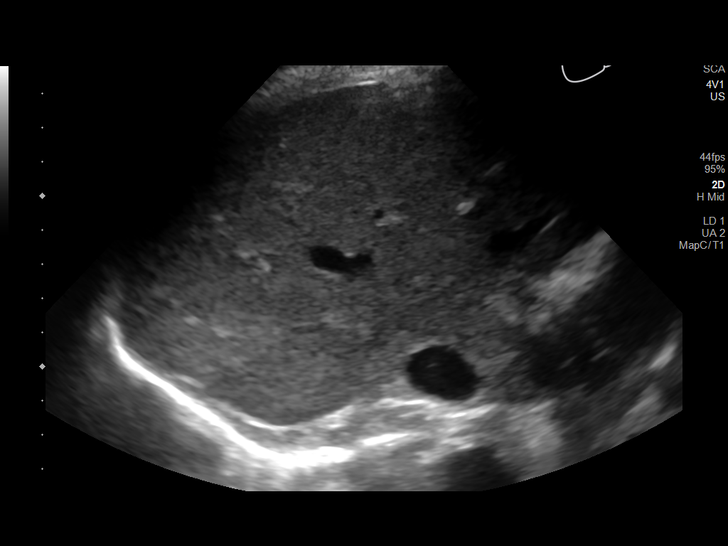
[im 22/36]
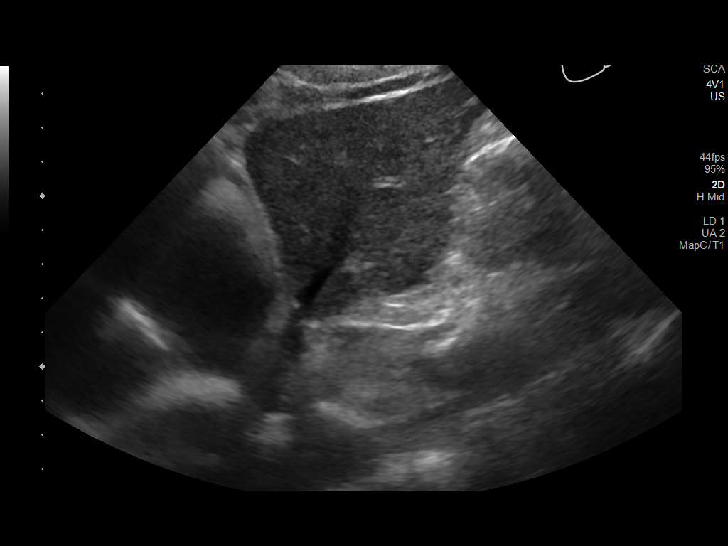
[im 24/36]
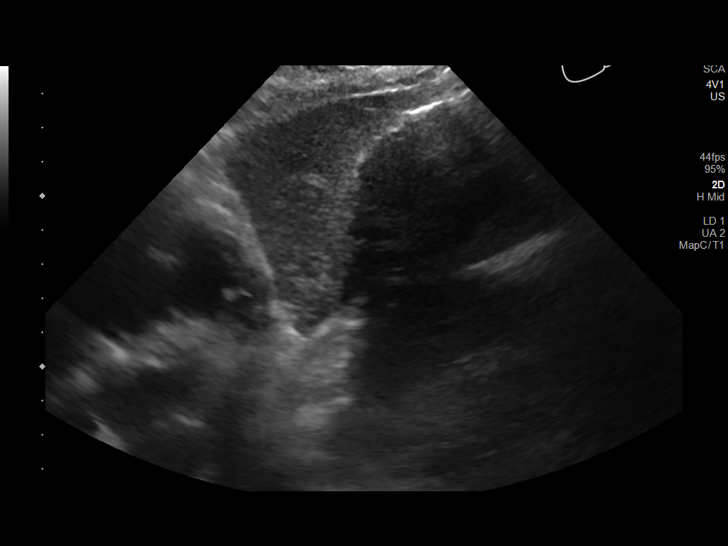
[im 27/36]
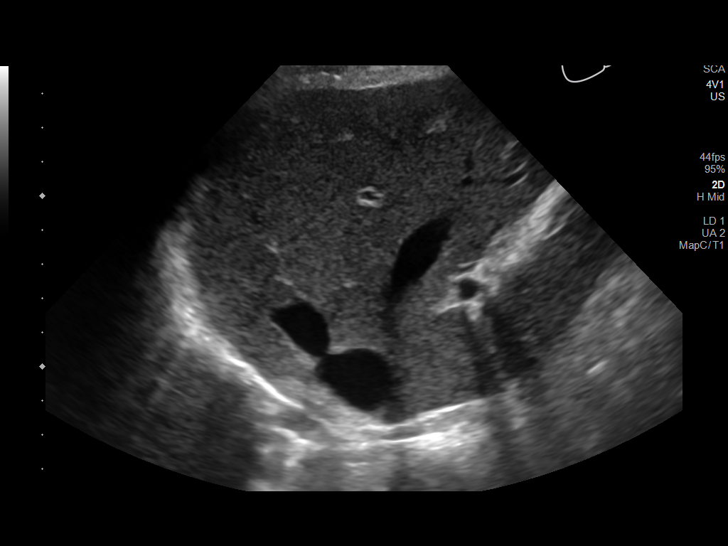
[im 30/36]
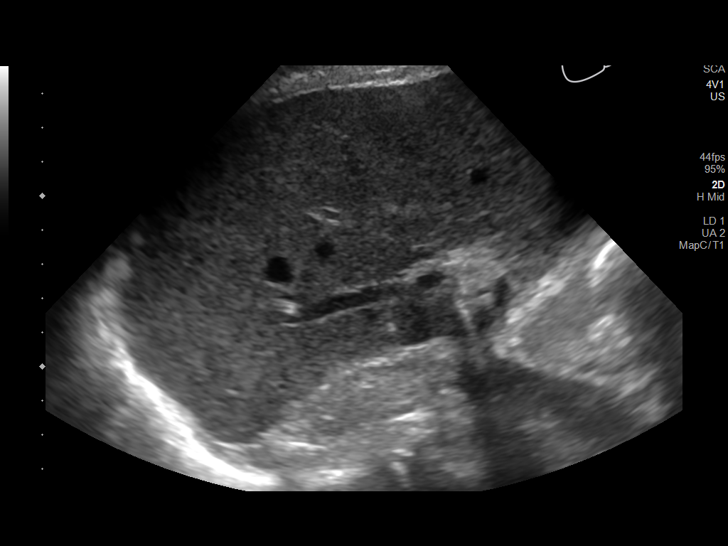
[im 33/36]
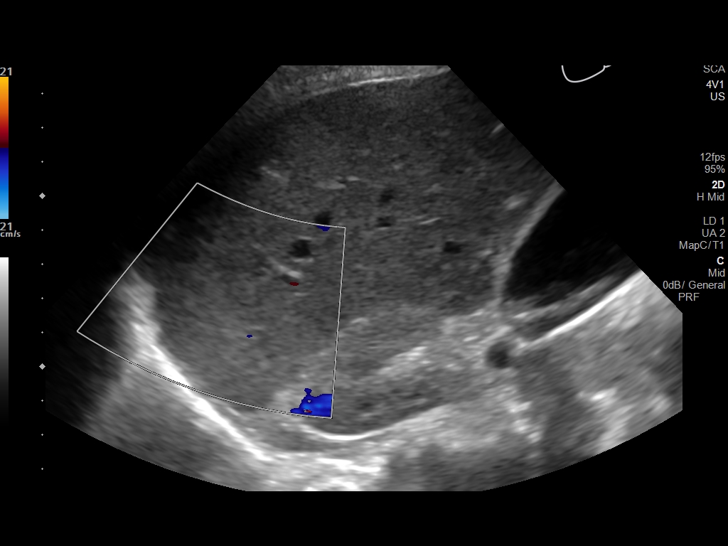
[im 36/36]
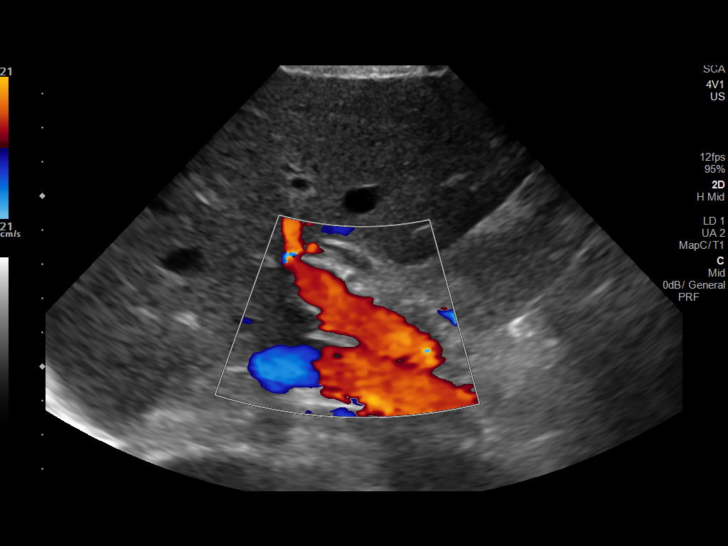

[14 of 25 positions shown; findings below may reference images not displayed]

FINDINGS: Gallbladder:

Multiple gallstones are noted. No gallbladder wall thickening or
pericholecystic fluid is noted. No sonographic Murphy's sign is
noted.

Common bile duct:

Diameter: 5 mm which is within normal limits.

Liver:

Ill-defined area of increased echogenicity is noted posteriorly in
right hepatic lobe measuring approximately 5 x 3 cm. Otherwise
within normal limits in parenchymal echogenicity. Portal vein is
patent on color Doppler imaging with normal direction of blood flow
towards the liver.
IMPRESSION: Cholelithiasis without evidence of cholecystitis.

Ill-defined area of increased echogenicity is noted posteriorly
right hip which most likely represents either artifact or focal
fatty infiltration.

## 2020-03-14 IMAGING — US ULTRASOUND ABDOMEN COMPLETE
1 series · 14 of 25 positions shown · non-contrast
Comparison: Ultrasound dated 11/21/2018 and CT scan dated
10/14/2018

CLINICAL DATA: Right upper quadrant pain.  Known cholelithiasis.

EXAM:
ABDOMEN ULTRASOUND COMPLETE

[Series 1: ultrasound abdomen complete · 14 of 154 slices shown]
[im 1/154]
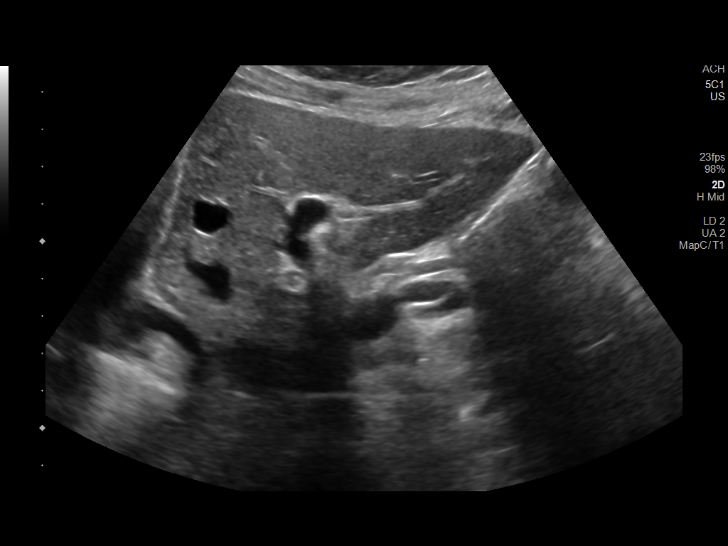
[im 13/154]
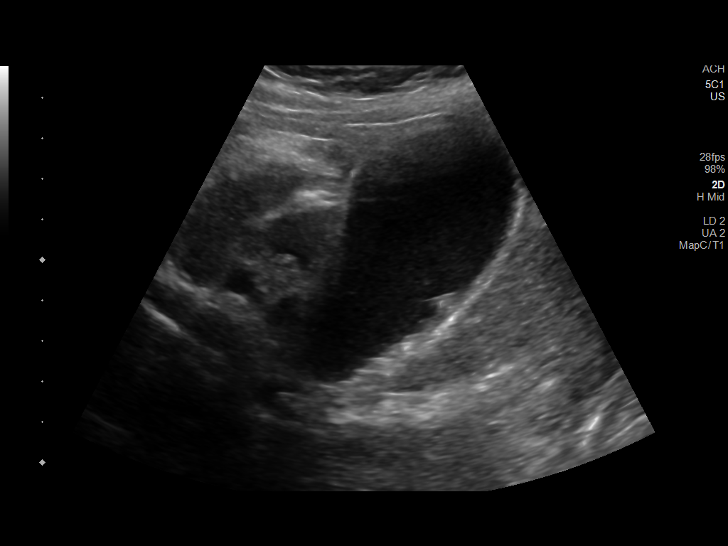
[im 26/154]
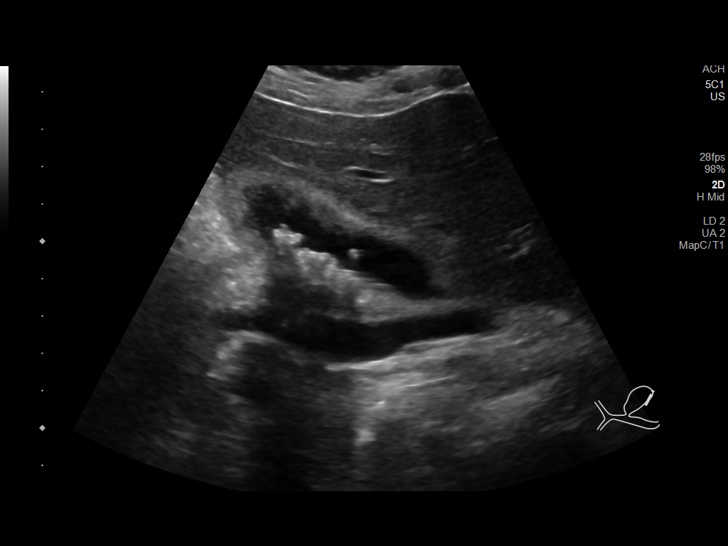
[im 39/154]
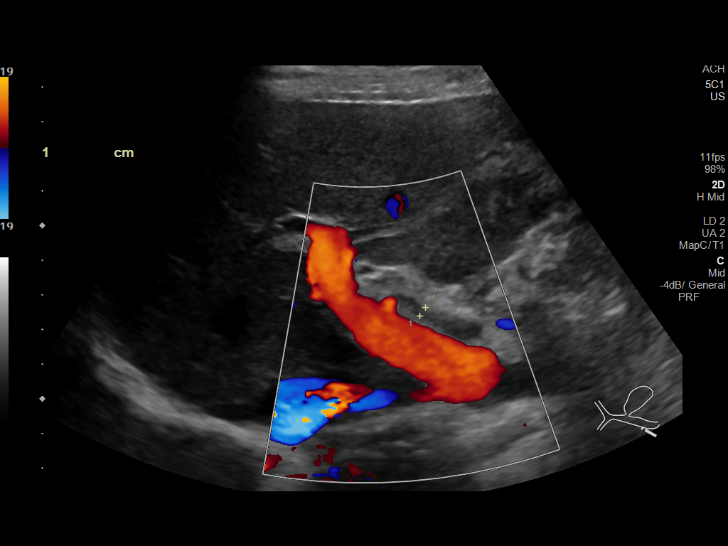
[im 52/154]
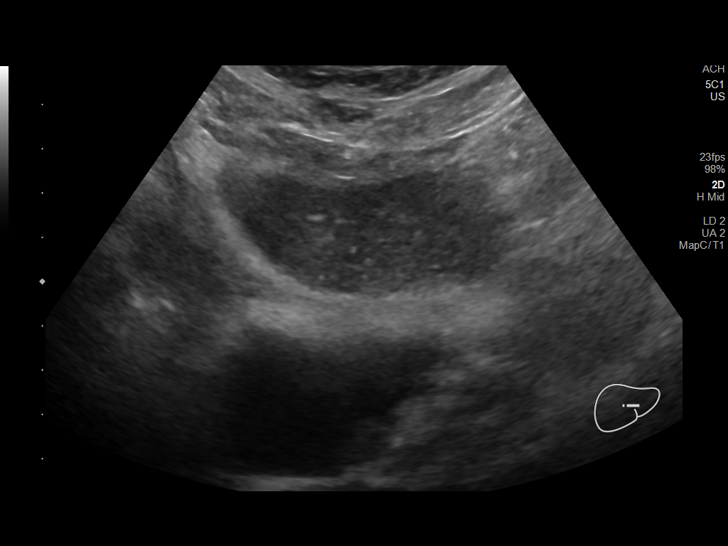
[im 58/154]
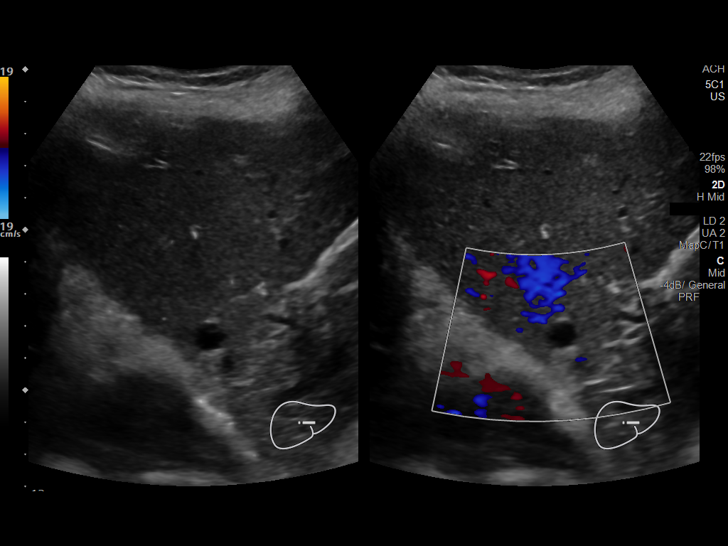
[im 71/154]
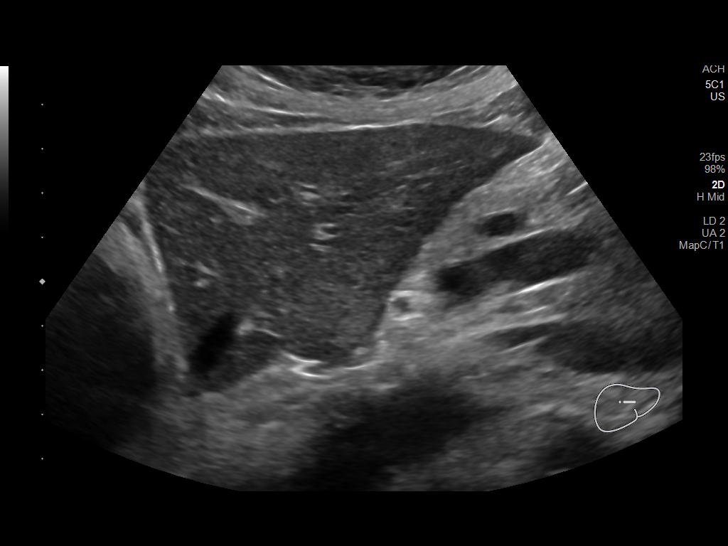
[im 83/154]
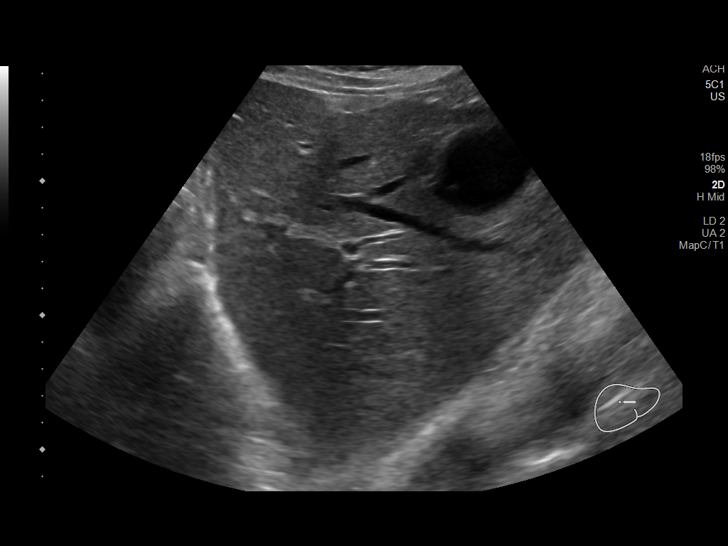
[im 96/154]
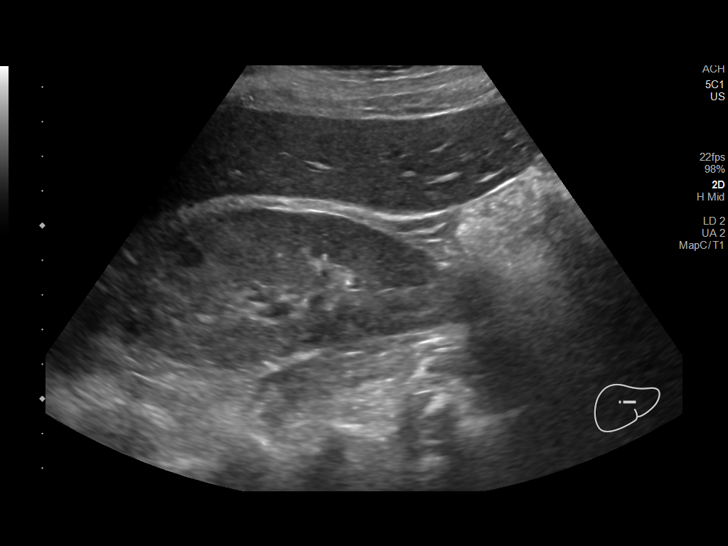
[im 103/154]
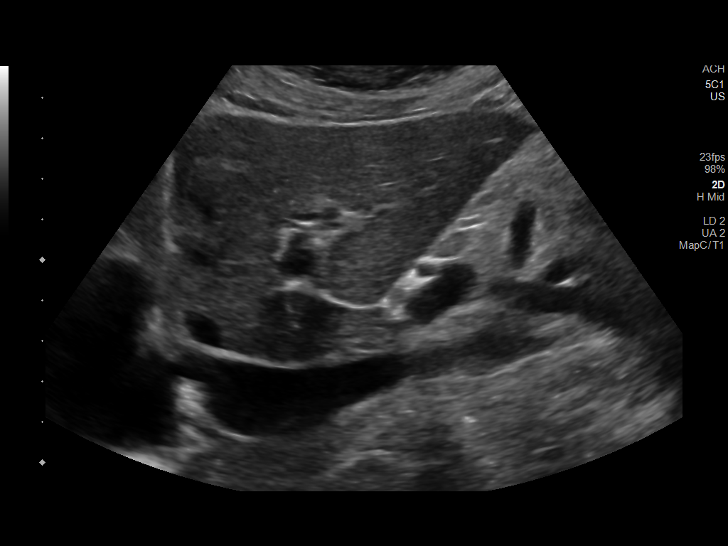
[im 115/154]
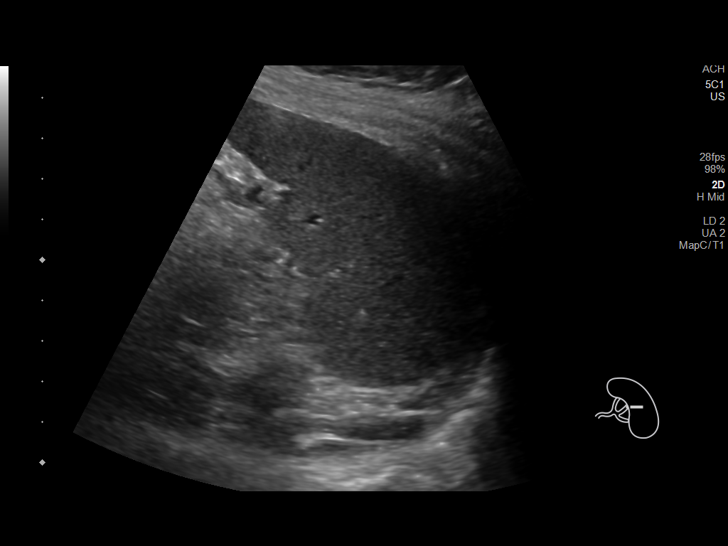
[im 128/154]
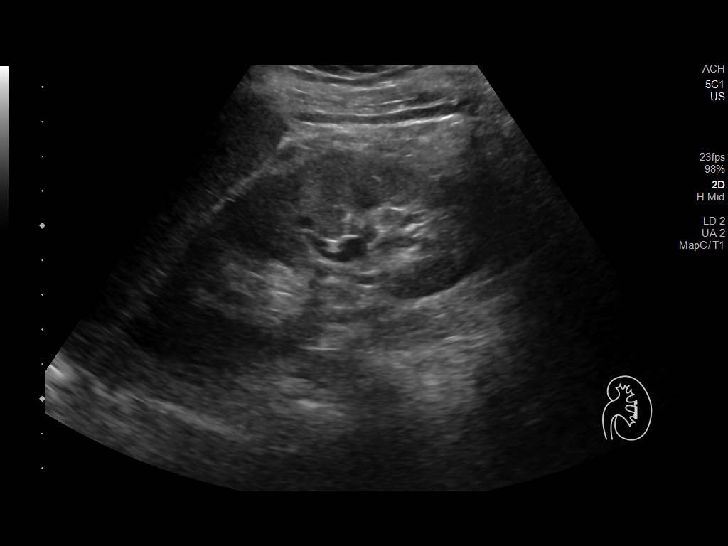
[im 141/154]
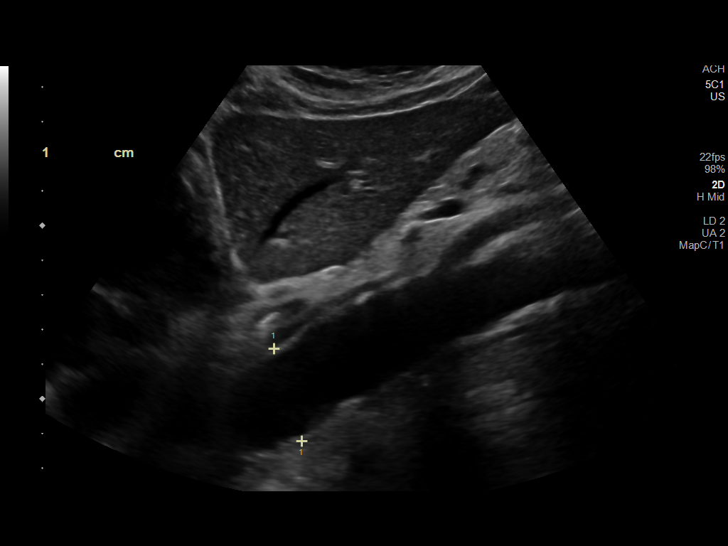
[im 154/154]
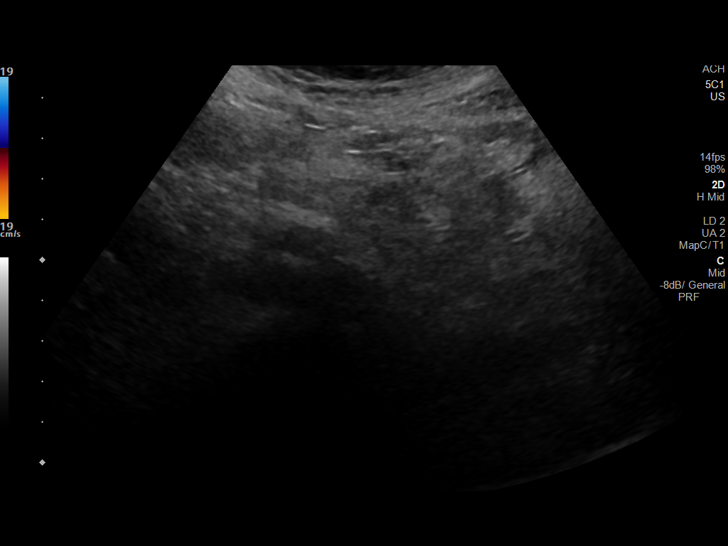

[14 of 25 positions shown; findings below may reference images not displayed]

FINDINGS: Gallbladder: There are multiple gallstones. Negative sonographic
Murphy's sign. Wall thickness is normal at 3 mm.

Common bile duct: Diameter: 5.3 mm, normal.

Liver: Multiple simple up attic cysts as demonstrated on prior
exams. Otherwise normal. Portal vein is patent on color Doppler
imaging with normal direction of blood flow towards the liver.

IVC: No abnormality visualized.

Pancreas: Visualized portion unremarkable.

Spleen: Size and appearance within normal limits.

Right Kidney: Length: 10.9 cm. Echogenicity within normal limits. No
mass or hydronephrosis visualized.

Left Kidney: Length: 11.8 cm. Echogenicity within normal limits. No
mass or hydronephrosis visualized.

Abdominal aorta: No aneurysm visualized.

Other findings: None.
IMPRESSION: Extensive cholelithiasis.  No change since the prior exam.

## 2020-03-18 ENCOUNTER — Ambulatory Visit: Payer: Medicare Other | Admitting: Physical Therapy

## 2020-03-18 ENCOUNTER — Telehealth: Payer: Self-pay | Admitting: Physical Therapy

## 2020-03-18 NOTE — Telephone Encounter (Signed)
Left message regarding no show to appointment today at 11 AM. Left her next appointment date/date and asked that she call if she cannot attend or needs to reschedule.

## 2020-03-20 ENCOUNTER — Emergency Department (HOSPITAL_COMMUNITY)
Admission: EM | Admit: 2020-03-20 | Discharge: 2020-03-20 | Disposition: A | Discharge status: Home / Self Care | Payer: Medicare Other | Attending: Emergency Medicine | Admitting: Emergency Medicine

## 2020-03-20 ENCOUNTER — Other Ambulatory Visit: Payer: Self-pay

## 2020-03-20 DIAGNOSIS — R519 Headache, unspecified: Secondary | ICD-10-CM | POA: Insufficient documentation

## 2020-03-20 DIAGNOSIS — Z5321 Procedure and treatment not carried out due to patient leaving prior to being seen by health care provider: Secondary | ICD-10-CM | POA: Diagnosis not present

## 2020-03-20 NOTE — ED Notes (Signed)
Pt called x3 for vital recheck, no answer. 

## 2020-03-20 NOTE — ED Triage Notes (Signed)
Pt here for eval of sinus pressure, cough, sneezing, and nasal congestion x 2 weeks. Taking Vick's cough syrup without relief. Also c/o lower back pain.

## 2020-03-20 NOTE — ED Notes (Signed)
Called pt x2 for vital recheck no answer 

## 2020-03-20 NOTE — ED Notes (Signed)
Pt called x2 for vital recheck no answer.

## 2020-03-25 ENCOUNTER — Telehealth: Payer: Self-pay | Admitting: Physical Therapy

## 2020-03-25 ENCOUNTER — Ambulatory Visit: Payer: Medicare Other | Attending: Internal Medicine | Admitting: Physical Therapy

## 2020-03-25 IMAGING — RF INTRAOPERATIVE CHOLANGIOGRAM
1 series · 4 of 4 positions shown · non-contrast
Comparison: None.

CLINICAL DATA: Intraoperative cholangiogram

EXAM:
INTRAOPERATIVE CHOLANGIOGRAM
TECHNIQUE: Cholangiographic images from the C-arm fluoroscopic device were
submitted for interpretation post-operatively. Please see the
procedural report for the amount of contrast and the fluoroscopy
time utilized.

[Series 1: run · 4 of 110 frames shown]
[frame 12/110]
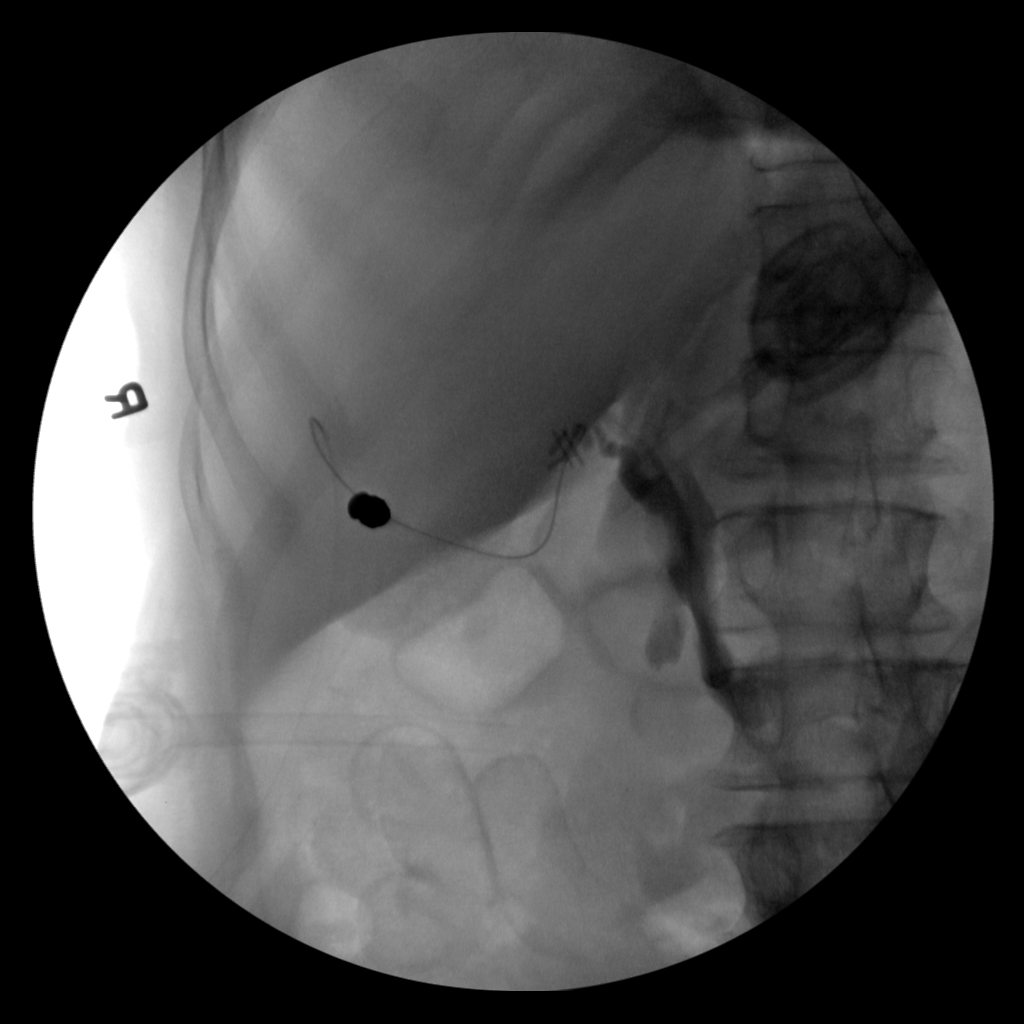
[frame 17/110]
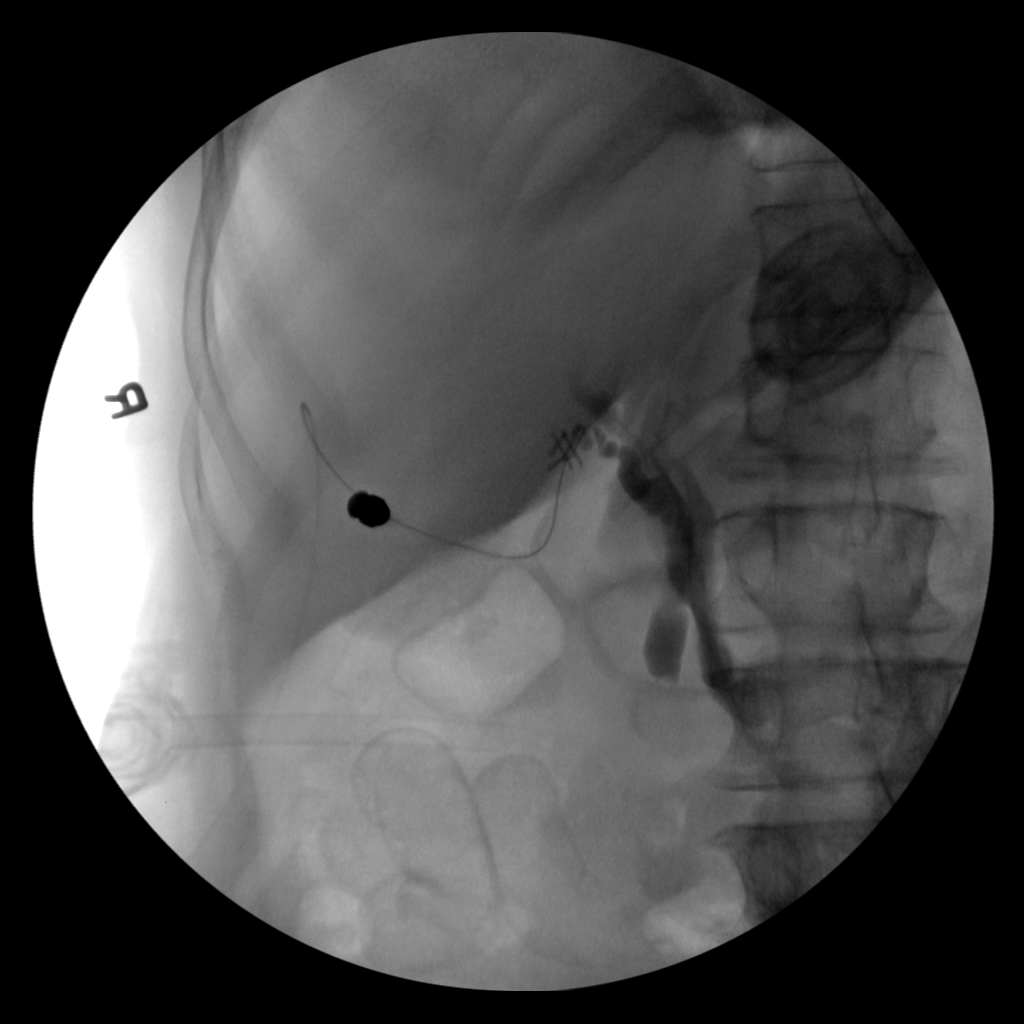
[frame 56/110]
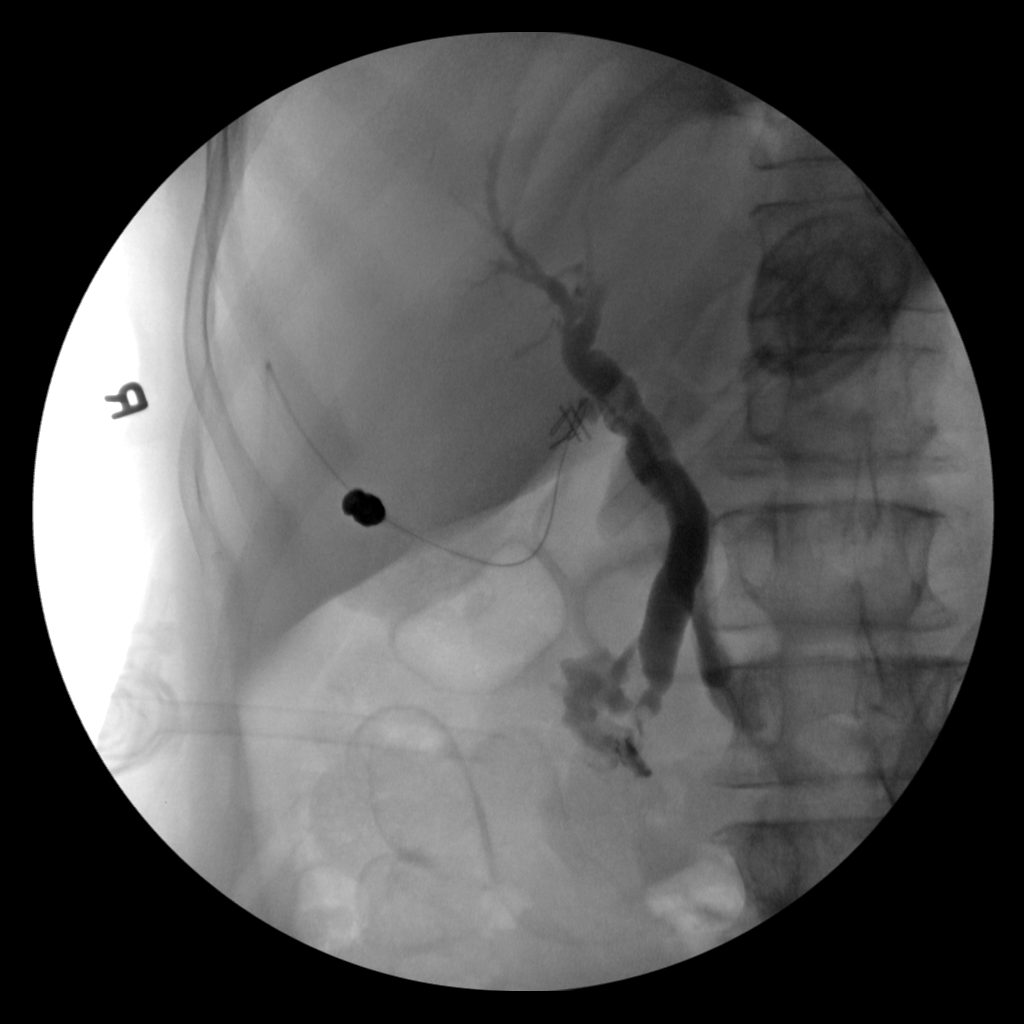
[frame 94/110]
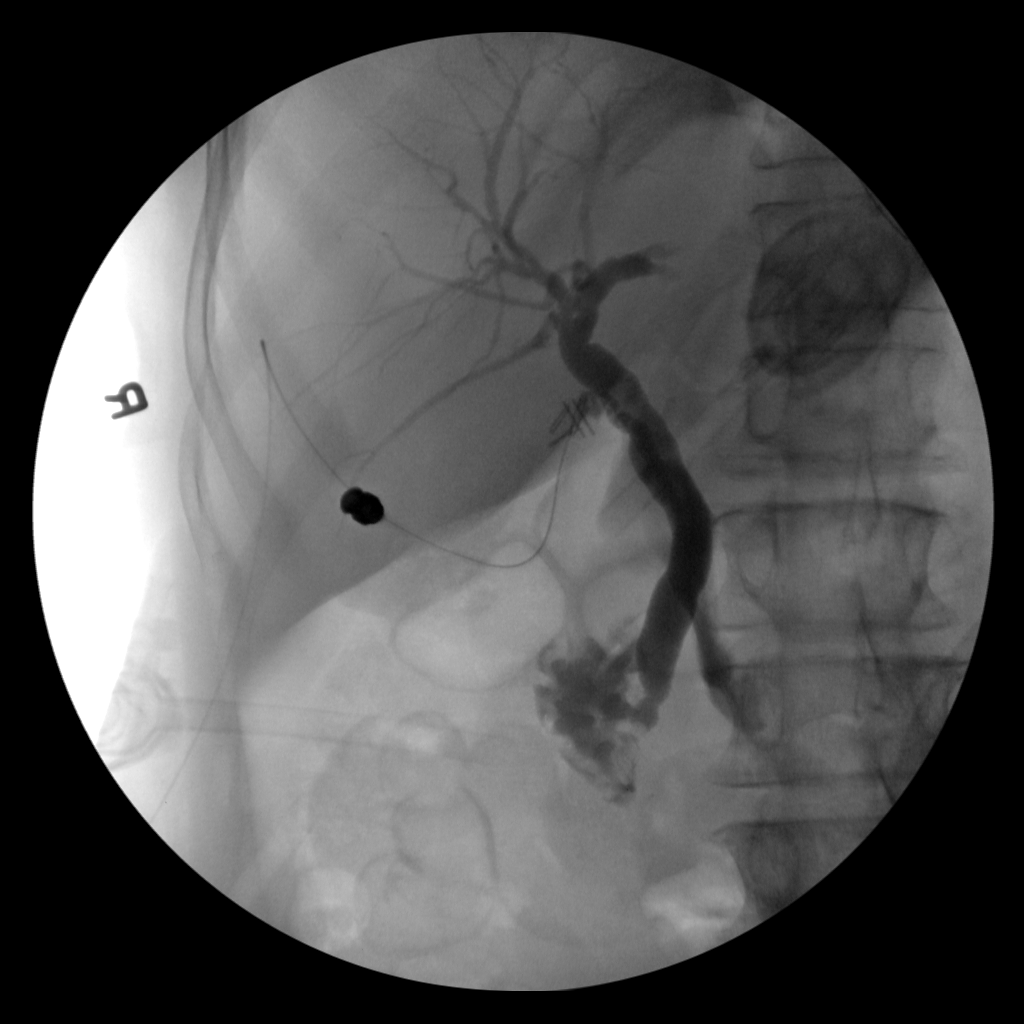

[4 of 4 positions shown; findings below may reference images not displayed]

FINDINGS: Contrast fills the biliary tree and duodenum. The common bile duct
is dilated. There are round filling defects in the distal common
bile duct.
IMPRESSION: Findings are worrisome for distal common bile duct stones. Contrast
does traverse across the common bile duct into the duodenum.

## 2020-03-25 NOTE — Telephone Encounter (Signed)
Spoke to patient regarding no show to last 2 physical therapy appointments. She reports that she tried to call several times and that she could not get through. She requested to cancel her appointments for now while she deals with a death in her family. She will call when she can resume appointments.

## 2020-03-26 ENCOUNTER — Other Ambulatory Visit: Payer: Self-pay

## 2020-03-26 IMAGING — RF ERCP
1 series · 4 of 4 positions shown · non-contrast
Comparison: Intraoperative cholangiogram 12/08/2018

CLINICAL DATA: 68-year-old female with choledocholithiasis
undergoing ERCP

EXAM:
ERCP
TECHNIQUE: Multiple spot images obtained with the fluoroscopic device and
submitted for interpretation post-procedure.
FLUOROSCOPY TIME:  Fluoroscopy Time:  2 minutes 24 seconds

[Series 1: unknown protocol · 0.20mm/px · 4 of 4 slices shown]
[im 1/4]
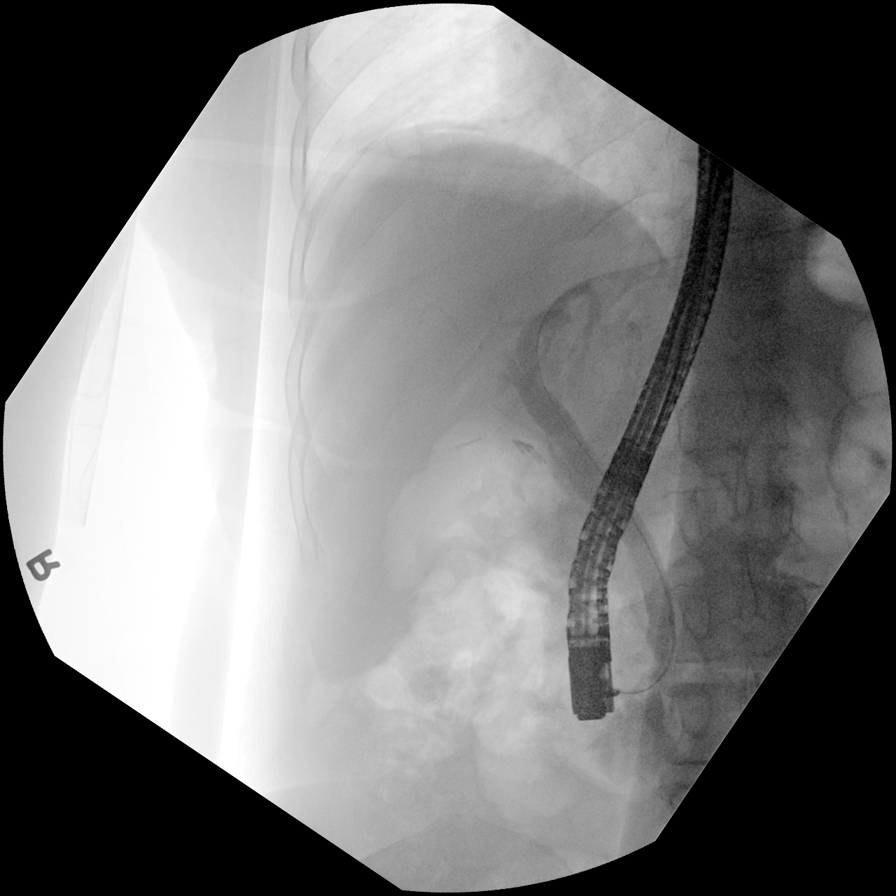
[im 2/4]
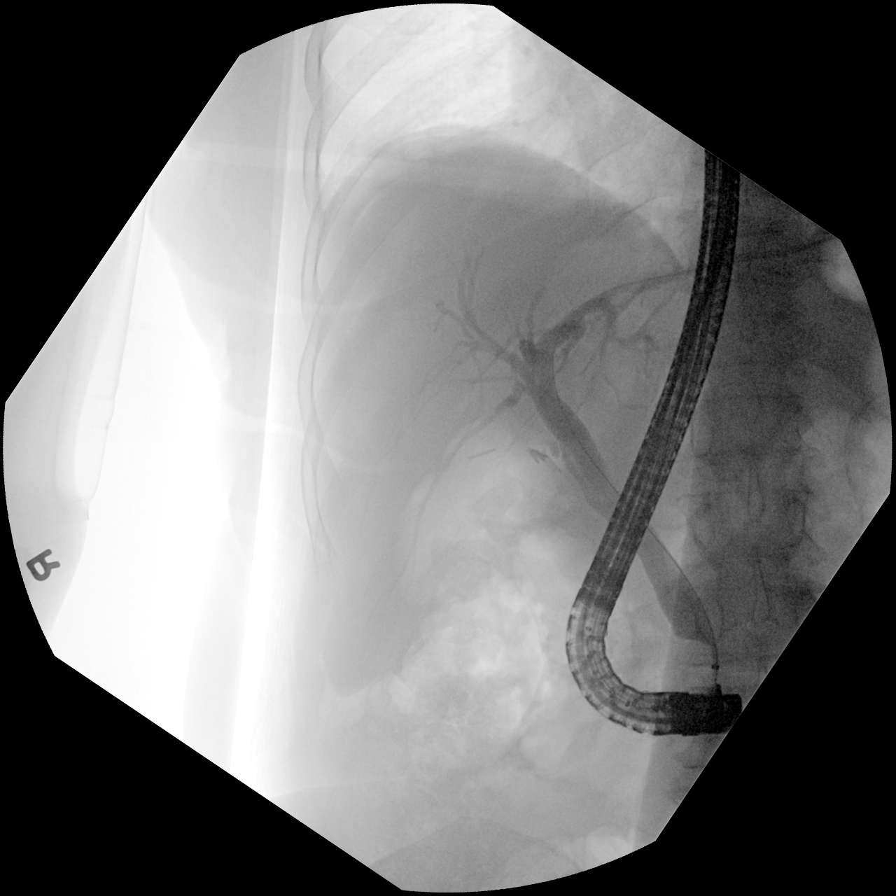
[im 3/4]
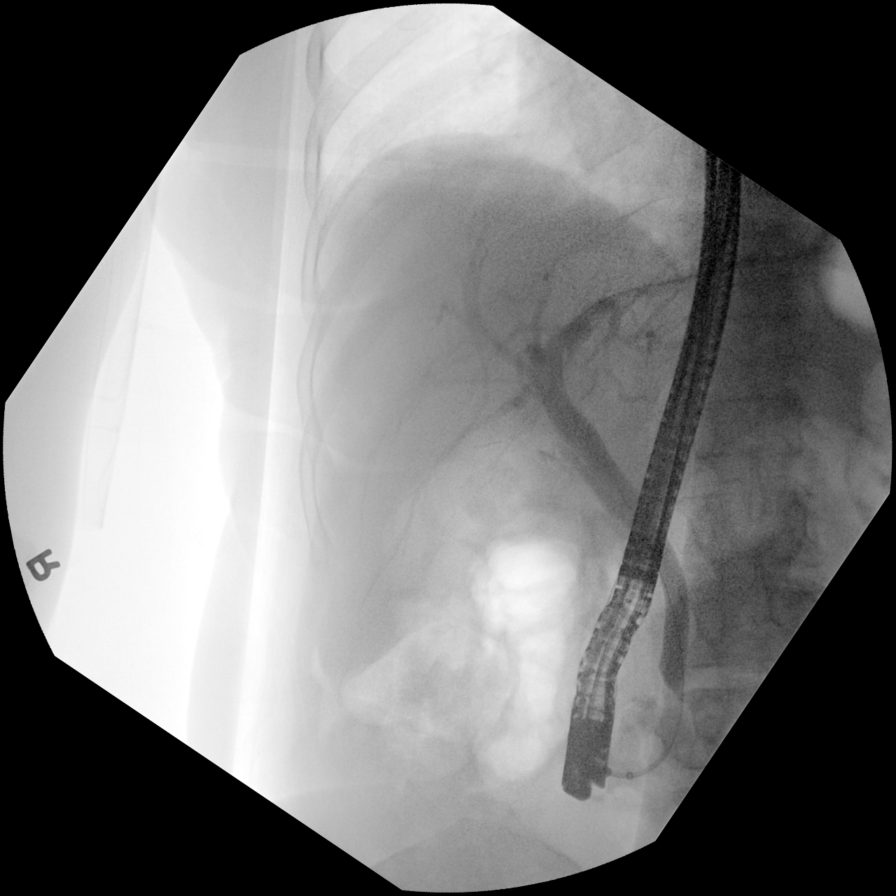
[im 4/4]
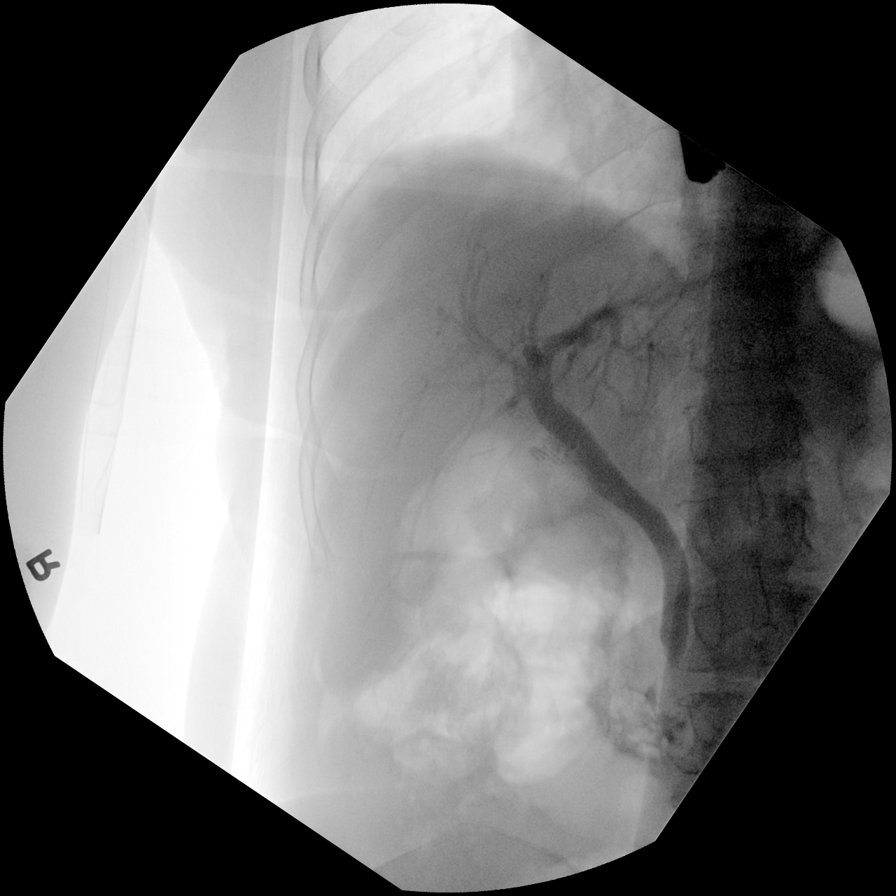

[4 of 4 positions shown; findings below may reference images not displayed]

FINDINGS: A total of 4 intraoperative spot radiographs are submitted for
review. The images demonstrate a flexible endoscope in the
descending duodenum with wire cannulation of the common bile duct.
Cholangiogram demonstrates mild biliary ductal dilatation.
Subsequent images confirm sphincterotomy and suggest balloon
sweeping of the common duct.
IMPRESSION: ERCP as above.

These images were submitted for radiologic interpretation only.
Please see the procedural report for the amount of contrast and the
fluoroscopy time utilized.

## 2020-03-26 NOTE — Telephone Encounter (Signed)
linaclotide (LINZESS) 145 MCG CAPS capsule, refill request @  [facility], [place] [address] Phone:  [phone]  Fax:  [phone]

## 2020-03-27 MED ORDER — LINACLOTIDE 145 MCG PO CAPS: ORAL_CAPSULE | ORAL | 1 refills | Status: DC

## 2020-04-01 ENCOUNTER — Encounter: Payer: Medicare Other | Admitting: Physical Therapy

## 2020-04-02 ENCOUNTER — Other Ambulatory Visit: Payer: Self-pay | Admitting: Internal Medicine

## 2020-04-02 MED ORDER — FLUTICASONE PROPIONATE HFA 44 MCG/ACT IN AERO: 1.0000 | INHALATION_SPRAY | Freq: Every day | RESPIRATORY_TRACT | 1 refills | Status: DC

## 2020-04-02 MED ORDER — LEVALBUTEROL TARTRATE 45 MCG/ACT IN AERO: INHALATION_SPRAY | RESPIRATORY_TRACT | 1 refills | Status: DC

## 2020-04-02 NOTE — Telephone Encounter (Signed)
Need refill on fluticasone (FLOVENT HFA) 44 MCG/ACT inhaler levalbuterol (XOPENEX HFA) 45 MCG/ACT inhaler  ;pt contact [facility], [address]

## 2020-04-08 ENCOUNTER — Ambulatory Visit: Payer: Medicare Other | Admitting: Physical Therapy

## 2020-04-08 ENCOUNTER — Telehealth: Payer: Self-pay | Admitting: Physical Therapy

## 2020-04-08 NOTE — Therapy (Signed)
[facility] [place], [place], [address] Phone: [phone]   Fax:  [phone]  Physical Therapy Evaluation/Discharge  Patient Details  Name: [name] MRN: [id number] Date of Birth: 05/01/1951 Referring Provider (PT): Lonia Skinner, MD   Encounter Date: 03/07/2020    Past Medical History:  Diagnosis Date  . A-fib ([facility])   . Allergy    seasonal  . Asthma   . Chronic anticoagulation   . Eczema   . GERD (gastroesophageal reflux disease)   . History of kidney stones   . Hypertension   . PAF (paroxysmal atrial fibrillation) ([facility]) 11/26/2015   April/May 2017: Multiple ED visits for A. Fib.  02/12/16: Improved on diltiazem IV. Cardiology consulted in the ED and discharge patient with diltiazem XR 120 mg once daily with 60 mg by mouth every 6 hours as needed for symptomatic palpitations, flecainide 50 mg twice daily. Referred to the atrial fibrillation clinic.  02/17/16: Seen in the atrial fibrillation clinic by NP Roderic Palau. Normal sin    Past Surgical History:  Procedure Laterality Date  . ATRIAL FIBRILLATION ABLATION N/A 09/30/2017   Procedure: ATRIAL FIBRILLATION ABLATION;  Surgeon: Thompson Grayer, MD;  Location: [facility];  Service: Cardiovascular;  Laterality: N/A;  . CHOLECYSTECTOMY N/A 12/08/2018   Procedure: LAPAROSCOPIC CHOLECYSTECTOMY WITH INTRAOPERATIVE CHOLANGIOGRAM;  Surgeon: Jovita Kussmaul, MD;  Location: [facility];  Service: General;  Laterality: N/A;  . ERCP N/A 12/09/2018   Procedure: ENDOSCOPIC RETROGRADE CHOLANGIOPANCREATOGRAPHY (ERCP);  Surgeon: Carol Ada, MD;  Location: [facility];  Service: Endoscopy;  Laterality: N/A;  . SPHINCTEROTOMY  12/09/2018   Procedure: SPHINCTEROTOMY;  Surgeon: Carol Ada, MD;  Location: [facility];  Service: Endoscopy;;  . TONSILLECTOMY    . TUBAL LIGATION      There were no vitals filed for this visit.                    Objective  measurements completed on examination: See above findings.                    PT Long Term Goals - 03/07/20 1735      PT LONG TERM GOAL #1   Title Independent with HEP    Baseline needs HEP    Time 6    Period Weeks    Status New    Target Date 04/18/20      PT LONG TERM GOAL #2   Title Increase trunk flexion AROM at least 20 deg to improve ability for bending for chores and activities such as donning shoes    Baseline 50 deg    Time 6    Period Weeks    Status New    Target Date 04/18/20      PT LONG TERM GOAL #3   Title Tolerate standing/ambulation for community mobility and activities such as grocery shopping with LBP 4/10 or less    Baseline pt. rates LBP 8-9/10    Time 6    Period Weeks    Status New    Target Date 04/18/20      PT LONG TERM GOAL #4   Title Increase bilat. hip extension strength 1/2 MMT grade to improve ability for lifting for chores and transfers from low seats    Baseline 4+/5    Time 6    Period Weeks    Status New    Target Date 04/18/20  Patient will benefit from skilled therapeutic intervention in order to improve the following deficits and impairments:  Pain, Impaired flexibility, Decreased strength, Decreased activity tolerance, Decreased range of motion, Difficulty walking  Visit Diagnosis: Acute bilateral low back pain without sciatica - Plan: PT plan of care cert/re-cert  Difficulty in walking, not elsewhere classified - Plan: PT plan of care cert/re-cert     Problem List Patient Active Problem List   Diagnosis Date Noted  . Lower back pain 02/14/2020  . Fall 07/31/2019  . Chest pain 07/23/2019  . Gallstones 12/08/2018  . Choledocholithiasis   . Vaginal discharge 10/11/2018  . Vaginal bleeding 10/11/2018  . History of radiofrequency ablation (RFA) procedure for cardiac arrhythmia   . Asthma exacerbation 08/28/2018  . Chronic congestion of paranasal sinus 06/14/2018  . Primary  osteoarthritis involving multiple joints 06/14/2018  . Acute pain of left knee 05/15/2018  . Hyperpigmented skin lesion 09/09/2017  . Osteoporosis without current pathological fracture 04/15/2017  . Constipation 10/19/2016  . Normocytic anemia 10/19/2016  . Hyperlipidemia 03/18/2016  . Special screening for malignant neoplasms, colon 03/17/2016  . Cervical cancer screening 03/17/2016  . Assistance with transportation 03/17/2016  . HTN (hypertension), benign   . GERD (gastroesophageal reflux disease)   . PAF (paroxysmal atrial fibrillation) ([facility]) 11/26/2015  . Chronic anticoagulation 11/26/2015  . Marijuana abuse 06/11/2015  . Hypokalemia 01/07/2012      PHYSICAL THERAPY DISCHARGE SUMMARY  Visits from Start of Care: 1  Current functional level related to goals / functional outcomes: Patient did not return for further therapy after initial evaluation visit. She was called 04/08/20 and reports has been out of town but back doing well with HEP, Plan discharge formal therapy-she will continue HEP and follow up with MD if having any future changes in status.   Remaining deficits: NA   Education / Equipment: HEP Plan: Patient agrees to discharge.  Patient goals were not met. Patient is being discharged due to not returning since the last visit.  ?????           Beaulah Dinning, PT, DPT 04/08/20 11:42 AM      [facility] [address] [place], [place], [address] Phone: [phone]   Fax:  [phone]  Name: DODIE PARISI MRN: [id number] Date of Birth: 03/24/1951

## 2020-04-08 NOTE — Telephone Encounter (Signed)
Called and spoke with patient regarding missed therapy appointment/no show at 11 AM today. She reports has been out of town and had contacted clinic to cancel visits. She reports doing HEP and back has been doing well. Discussed given improvement she will continue with HEP and discharge formal therapy for now and recommend MD follow up if having any future changes in status.

## 2020-04-15 ENCOUNTER — Other Ambulatory Visit: Payer: Self-pay | Admitting: *Deleted

## 2020-04-16 MED ORDER — TIOTROPIUM BROMIDE MONOHYDRATE 18 MCG IN CAPS: 18.0000 ug | ORAL_CAPSULE | Freq: Every day | RESPIRATORY_TRACT | 1 refills | Status: DC

## 2020-04-16 MED ORDER — LOSARTAN POTASSIUM 50 MG PO TABS: ORAL_TABLET | ORAL | 1 refills | Status: DC

## 2020-04-30 ENCOUNTER — Ambulatory Visit (HOSPITAL_COMMUNITY): Payer: Medicare Other | Admitting: Nurse Practitioner

## 2020-05-02 ENCOUNTER — Encounter: Payer: Medicare Other | Admitting: Internal Medicine

## 2020-05-08 ENCOUNTER — Ambulatory Visit (HOSPITAL_COMMUNITY)
Admission: RE | Admit: 2020-05-08 | Discharge: 2020-05-08 | Disposition: A | Discharge status: Home / Self Care | Payer: Medicare Other | Source: Ambulatory Visit | Attending: Nurse Practitioner | Admitting: Nurse Practitioner

## 2020-05-08 ENCOUNTER — Other Ambulatory Visit: Payer: Self-pay

## 2020-05-08 ENCOUNTER — Encounter (HOSPITAL_COMMUNITY): Payer: Self-pay | Admitting: Nurse Practitioner

## 2020-05-08 VITALS — BP 176/84 | HR 76 | Ht 67.0 in | Wt 147.2 lb

## 2020-05-08 DIAGNOSIS — Z9049 Acquired absence of other specified parts of digestive tract: Secondary | ICD-10-CM | POA: Insufficient documentation

## 2020-05-08 DIAGNOSIS — D6869 Other thrombophilia: Secondary | ICD-10-CM

## 2020-05-08 DIAGNOSIS — I480 Paroxysmal atrial fibrillation: Secondary | ICD-10-CM | POA: Diagnosis not present

## 2020-05-08 DIAGNOSIS — I10 Essential (primary) hypertension: Secondary | ICD-10-CM | POA: Insufficient documentation

## 2020-05-08 DIAGNOSIS — Z7901 Long term (current) use of anticoagulants: Secondary | ICD-10-CM | POA: Insufficient documentation

## 2020-05-08 DIAGNOSIS — J45909 Unspecified asthma, uncomplicated: Secondary | ICD-10-CM | POA: Diagnosis not present

## 2020-05-08 DIAGNOSIS — Z79899 Other long term (current) drug therapy: Secondary | ICD-10-CM | POA: Insufficient documentation

## 2020-05-08 DIAGNOSIS — Z87891 Personal history of nicotine dependence: Secondary | ICD-10-CM | POA: Insufficient documentation

## 2020-05-08 NOTE — Progress Notes (Addendum)
Primary Care Physician: Shelby Pou, MD Referring Physician:ER f/u EP: [name] is a 69 y.o. female with a h/o paroxysmal afib, s/p ablation, 11/2015, previously treated with flecainide ,asthma, seasonal allergies, that is in the afib clinic for f/u. She feels well, no afib to report. Continues on xarelto 20 mg for a CHA2DS2VASc score of 3. BP is elevated as she last her nephew yesterday to cancer. States it is usually in normal range at home. Has had both covid shots.    Today, she denies symptoms of palpitations, chest pain, shortness of breath, orthopnea, PND, lower extremity edema, dizziness, presyncope, syncope, or neurologic sequela. The patient is tolerating medications without difficulties and is otherwise without complaint today.   Past Medical History:  Diagnosis Date  . A-fib ([facility])   . Allergy    seasonal  . Asthma   . Chronic anticoagulation   . Eczema   . GERD (gastroesophageal reflux disease)   . History of kidney stones   . Hypertension   . PAF (paroxysmal atrial fibrillation) ([facility]) 11/26/2015   April/May 2017: Multiple ED visits for A. Fib.  02/12/16: Improved on diltiazem IV. Cardiology consulted in the ED and discharge patient with diltiazem XR 120 mg once daily with 60 mg by mouth every 6 hours as needed for symptomatic palpitations, flecainide 50 mg twice daily. Referred to the atrial fibrillation clinic.  02/17/16: Seen in the atrial fibrillation clinic by NP [name]. Normal sin   Past Surgical History:  Procedure Laterality Date  . ATRIAL FIBRILLATION ABLATION N/A 09/30/2017   Procedure: ATRIAL FIBRILLATION ABLATION;  Surgeon: Thompson Grayer, MD;  Location: [facility];  Service: Cardiovascular;  Laterality: N/A;  . CHOLECYSTECTOMY N/A 12/08/2018   Procedure: LAPAROSCOPIC CHOLECYSTECTOMY WITH INTRAOPERATIVE CHOLANGIOGRAM;  Surgeon: Jovita Kussmaul, MD;  Location: [facility];  Service: General;  Laterality: N/A;  . ERCP N/A 12/09/2018    Procedure: ENDOSCOPIC RETROGRADE CHOLANGIOPANCREATOGRAPHY (ERCP);  Surgeon: Carol Ada, MD;  Location: [facility];  Service: Endoscopy;  Laterality: N/A;  . SPHINCTEROTOMY  12/09/2018   Procedure: SPHINCTEROTOMY;  Surgeon: Carol Ada, MD;  Location: [facility];  Service: Endoscopy;;  . TONSILLECTOMY    . TUBAL LIGATION      Current Outpatient Medications  Medication Sig Dispense Refill  . acetaminophen (TYLENOL) 500 MG tablet Take 2 tablets (1,000 mg total) by mouth every 8 (eight) hours as needed. (Patient taking differently: Take 1,000 mg by mouth as needed. ) 30 tablet 0  . Bismuth Subsalicylate (PEPTO-BISMOL PO) Take by mouth. Taking by mouth once daily- Liquid form    . Calcium Carbonate Antacid (TUMS PO) Take by mouth. Taking by mouth 3 chewables daily    . Capsaicin 0.1 % CREA Apply 1 g topically 3 (three) times daily as needed. 60 g 0  . cetirizine (ZYRTEC) 10 MG tablet Take 10 mg by mouth daily.    Marland Kitchen diltiazem (CARDIZEM) 60 MG tablet Take 1 tablet (60 mg total) by mouth every 6 (six) hours as needed (Rapid AFIB over 100). 45 tablet 1  . diltiazem (CARTIA XT) 120 MG 24 hr capsule Take 1 capsule (120 mg total) by mouth 2 (two) times daily. 180 capsule 2  . famotidine (PEPCID) 40 MG tablet Take 40 mg by mouth daily.    . fluticasone (FLOVENT HFA) 44 MCG/ACT inhaler Inhale 1 puff into the lungs daily. 30.8 g 1  . levalbuterol (XOPENEX HFA) 45 MCG/ACT inhaler INHALE 1 PUFF INTO THE LUNGS  EVERY 6 HOURS AS NEEDED FOR SHORTNESS OF BREATH 45 g 1  . levocetirizine (XYZAL) 5 MG tablet Take 1 tablet (5 mg total) by mouth every evening. 30 tablet 0  . losartan (COZAAR) 50 MG tablet TAKE 1 TABLET(50 MG) BY MOUTH DAILY 90 tablet 1  . magnesium hydroxide (MILK OF MAGNESIA) 400 MG/5ML suspension Take 15-30 mLs by mouth at bedtime as needed for mild constipation. 360 mL 0  . montelukast (SINGULAIR) 10 MG tablet Take 10 mg by mouth at bedtime.    Marland Kitchen Phenir-PE-Sod Sal-Caff Cit (COUGH/COLD  MEDICINE PO) Take by mouth. Liquid form- Using the daily- taking by mouth once daily    . potassium chloride (KLOR-CON) 10 MEQ tablet Take 2 tablets (20 mEq total) by mouth 2 (two) times daily. 180 tablet 1  . rivaroxaban (XARELTO) 20 MG TABS tablet Take 1 tablet (20 mg total) by mouth daily with supper. 30 tablet 6  . tiotropium (SPIRIVA) 18 MCG inhalation capsule Place 1 capsule (18 mcg total) into inhaler and inhale daily. 90 capsule 1  . triamcinolone cream (KENALOG) 0.1 % Apply 1 application topically 2 (two) times daily. Mix 1:1 with Eucerin 453.6 g 0  . linaclotide (LINZESS) 145 MCG CAPS capsule TAKE 1 CAPSULE(145 MCG) BY MOUTH DAILY BEFORE BREAKFAST 90 capsule 1  . methocarbamol (ROBAXIN) 500 MG tablet Take 1 tablet (500 mg total) by mouth 2 (two) times daily. 20 tablet 0   No current facility-administered medications for this encounter.    Allergies  Allergen Reactions  . Albuterol Palpitations    Caused afib   . Percocet [Oxycodone-Acetaminophen] Shortness Of Breath, Itching and Anxiety    Makes jittery, difficulty breathing, itching  . Celecoxib Rash  . Protonix [Pantoprazole Sodium] Palpitations    Social History   Socioeconomic History  . Marital status: Single    Spouse name: Not on file  . Number of children: Not on file  . Years of education: Not on file  . Highest education level: Not on file  Occupational History  . Occupation: Retired  Tobacco Use  . Smoking status: Former Smoker    Quit date: 10/24/2015    Years since quitting: 4.5  . Smokeless tobacco: Never Used  Vaping Use  . Vaping Use: Never used  Substance and Sexual Activity  . Alcohol use: Not Currently  . Drug use: Not Currently    Types: Marijuana    Comment: Smoked marijuana in the past.  . Sexual activity: Yes    Partners: Male    Birth control/protection: Post-menopausal  Other Topics Concern  . Not on file  Social History Narrative   Lives alone in [place], [place] and has 2 daughters  who are within driving distance   Enjoys watching soap operas, reading, exercise   Social Determinants of Health   Financial Resource Strain:   . Difficulty of Paying Living Expenses: Not on file  Food Insecurity:   . Worried About Charity fundraiser in the Last Year: Not on file  . Ran Out of Food in the Last Year: Not on file  Transportation Needs:   . Lack of Transportation (Medical): Not on file  . Lack of Transportation (Non-Medical): Not on file  Physical Activity:   . Days of Exercise per Week: Not on file  . Minutes of Exercise per Session: Not on file  Stress:   . Feeling of Stress : Not on file  Social Connections:   . Frequency of Communication with Friends and Family: Not  on file  . Frequency of Social Gatherings with Friends and Family: Not on file  . Attends Religious Services: Not on file  . Active Member of Clubs or Organizations: Not on file  . Attends Archivist Meetings: Not on file  . Marital Status: Not on file  Intimate Partner Violence:   . Fear of Current or Ex-Partner: Not on file  . Emotionally Abused: Not on file  . Physically Abused: Not on file  . Sexually Abused: Not on file    Family History  Problem Relation Age of Onset  . Heart attack Mother        [name] age  . Breast cancer Sister        Late 78s; now s/p mastectomy   . Lung cancer Brother        x 2  . Cancer Sister   . Cancer Sister   . Cancer Sister   . Colon cancer Neg Hx   . Colon polyps Neg Hx   . Esophageal cancer Neg Hx   . Rectal cancer Neg Hx   . Stomach cancer Neg Hx     ROS- All systems are reviewed and negative except as per the HPI above  Physical Exam: Vitals:   05/08/20 0853  BP: (!) 176/84  Pulse: 76  Weight: 66.8 kg  Height: 5\' 7"  (1.702 m)   Wt Readings from Last 3 Encounters:  05/08/20 66.8 kg  03/20/20 67.6 kg  02/14/20 65.3 kg    Labs: Lab Results  Component Value Date   NA 143 01/03/2020   K 3.7 01/03/2020   CL 108 01/03/2020    CO2 22 01/03/2020   GLUCOSE 109 (H) 01/03/2020   BUN 17 01/03/2020   CREATININE 0.88 01/03/2020   CALCIUM 9.6 01/03/2020   PHOS 3.0 04/15/2017   MG 2.1 08/29/2018   Lab Results  Component Value Date   INR 1.0 12/08/2018   Lab Results  Component Value Date   CHOL 245 (H) 03/17/2016   HDL 94 03/17/2016   LDLCALC 132 (H) 03/17/2016   TRIG 95 03/17/2016     GEN- The patient is well appearing, alert and oriented x 3 today.   Head- normocephalic, atraumatic Eyes-  Sclera clear, conjunctiva pink Ears- hearing intact Oropharynx- clear Neck- supple, no JVP Lymph- no cervical lymphadenopathy Lungs- Clear to ausculation bilaterally, normal work of breathing Heart- Regular rate and rhythm, no murmurs, rubs or gallops, PMI not laterally displaced GI- soft, NT, ND, + BS Extremities- no clubbing, cyanosis, or edema MS- no significant deformity or atrophy Skin- no rash or lesion Psych- euthymic mood, full affect Neuro- strength and sensation are intact  EKG-NSR with heart rate of at 76 bpm, pr int 128 bpm, qrs int 84 ms, qtc 429 ms Epic records reviewed   Assessment and Plan: 1. Paroxysmal afib S/p ablation 2017 Maintaining SR off flecainide  Continue  diltiazem  120 mg bid Has 60 mg dilt if needed  Continue xarelto 20 mg daily for a CHA2DS2VASc score of 3  2. HTN Elevated today She will recheck at home Usually controlled at home   F/u here in 9 months, sooner if needed  F/u with PCP as scheduled  [name], [facility] [address] [place], [place] [address] [phone]

## 2020-05-09 ENCOUNTER — Other Ambulatory Visit (HOSPITAL_COMMUNITY): Payer: Self-pay | Admitting: *Deleted

## 2020-05-09 MED ORDER — DILTIAZEM HCL 60 MG PO TABS
60.0000 mg | ORAL_TABLET | Freq: Four times a day (QID) | ORAL | 1 refills | Status: DC | PRN
Start: 2020-05-09 — End: 2020-11-05

## 2020-07-11 ENCOUNTER — Telehealth: Payer: Self-pay

## 2020-07-11 NOTE — Telephone Encounter (Signed)
Need refill on potassium chloride (KLOR-CON) 10 MEQ tablet(Expired)  ;pt contact Magdalena

## 2020-07-11 NOTE — Telephone Encounter (Signed)
[name] will need a bmp to assess whether ongoing potassium is necessary before we proceed with RF.  Could she please make an appt with me in the next several weeks? Thank you!

## 2020-07-23 DIAGNOSIS — I4891 Unspecified atrial fibrillation: Secondary | ICD-10-CM | POA: Insufficient documentation

## 2020-07-23 DIAGNOSIS — D6869 Other thrombophilia: Secondary | ICD-10-CM | POA: Insufficient documentation

## 2020-07-23 IMAGING — CR CHEST - 2 VIEW
2 series · 2 of 2 positions shown · non-contrast
Comparison: Chest x-ray dated 10/15/2018.

CLINICAL DATA: Shortness of breath for several days.

EXAM:
CHEST - 2 VIEW

[chest pa]
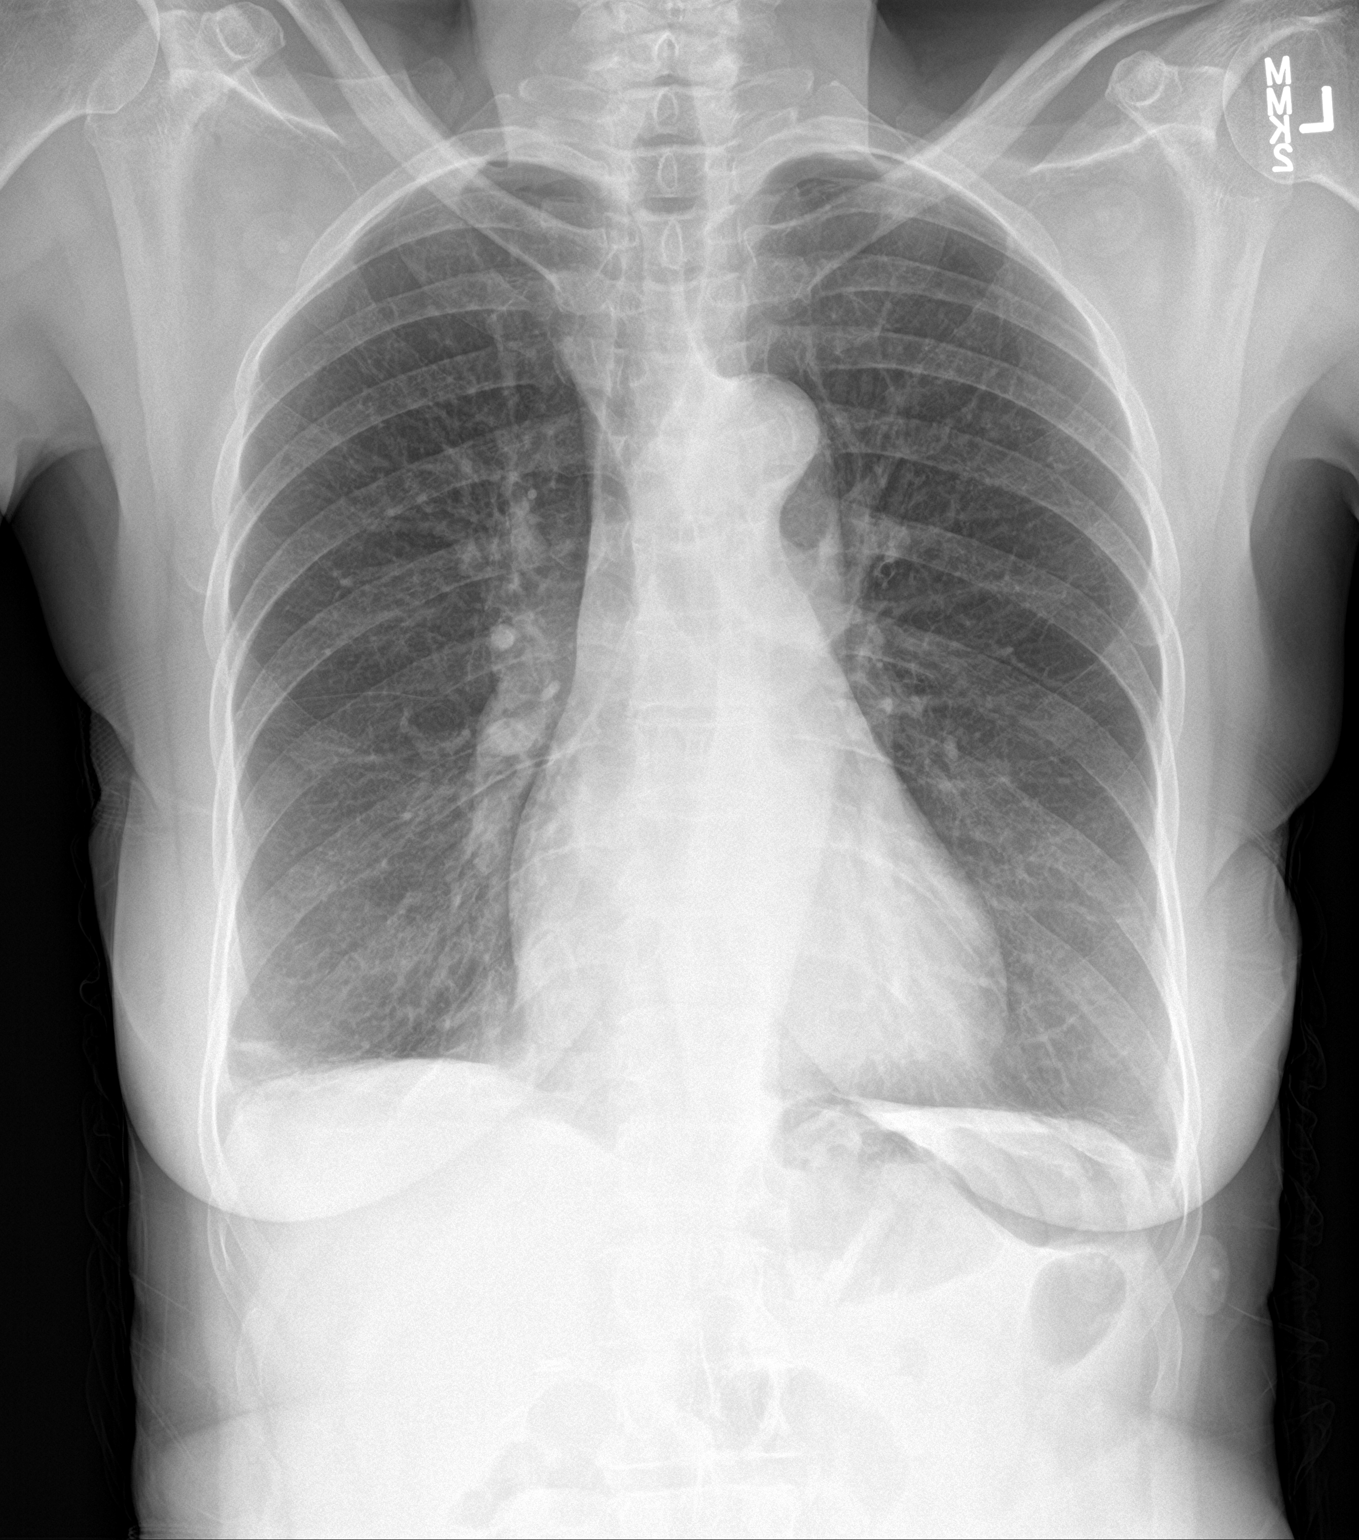

[chest lat]
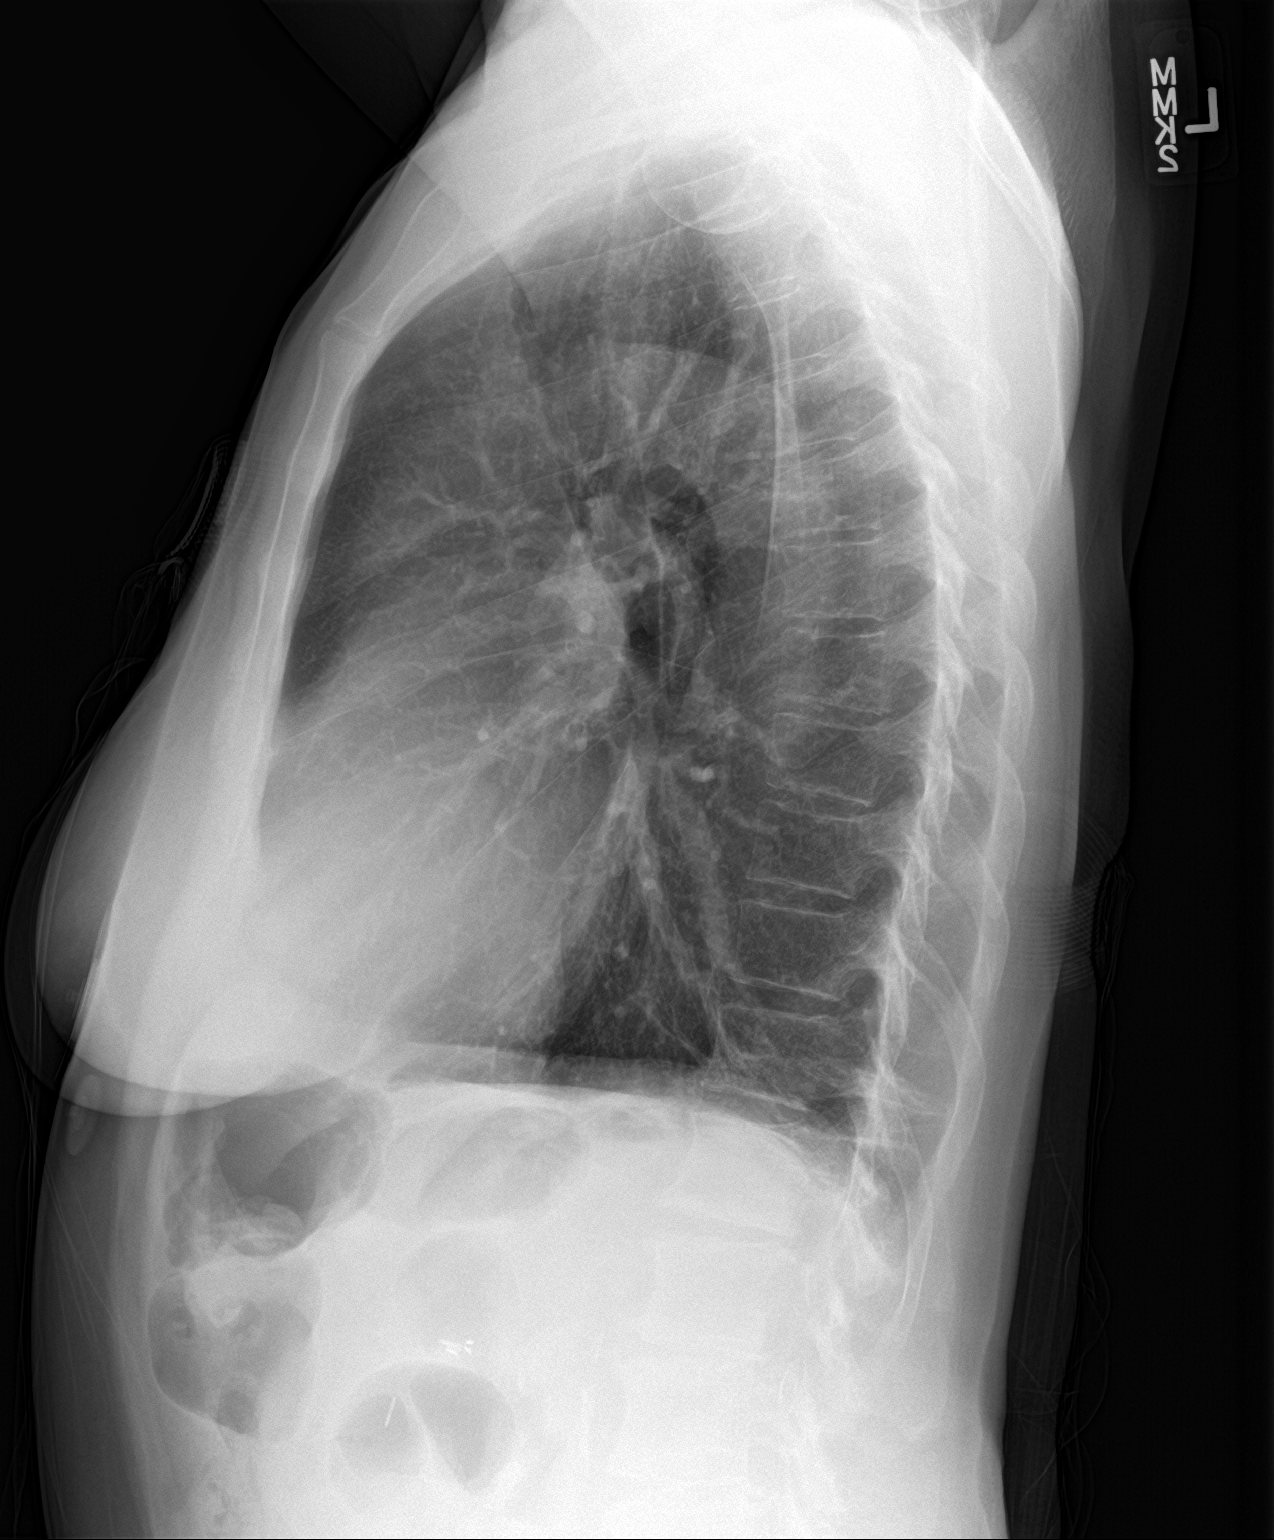

[2 of 2 positions shown; findings below may reference images not displayed]

FINDINGS: Heart size and mediastinal contours are within normal limits. Lungs
are hyperexpanded. Streaky opacities at the RIGHT lung base, most
likely mild atelectasis. Lungs otherwise clear. No pleural effusion
or pneumothorax seen osseous structures about the chest are
unremarkable.
IMPRESSION: 1. Hyperexpanded lungs indicating COPD/asthma.
2. Streaky opacities at the RIGHT lung base, most likely mild
atelectasis. If febrile, early developing pneumonia could not be
excluded.

## 2020-07-23 NOTE — Patient Instructions (Addendum)
[name],   It was good to meet you today and to have an opportunity to check your blood pressure and update some lab tests.  We reviewed your medications and focused on your active symptoms, which include hearing loss due to ear wax build up (ears flushed today), nasal congestion and runny nose due to "allergic rhinitis", which will be treated with a new nasal spray called Dymista (1 spray in each nostril twice a day every day), your burning stomach (changing omeprazole to pantoprazole at a higher dose), and your abdominal pain and tenderness on examination (for which we will order a CT "cat" scan.  Let's get back together in 4-6 weeks to see how you are doing.  I look forward to working together to help you feel the best that you can!  See you soon,  Dr. Jimmye Norman

## 2020-07-23 NOTE — Progress Notes (Signed)
New Patient Office Visit  Subjective:  Patient ID: [name], female    DOB: 19-Feb-1951  Age: 69 y.o. MRN: [id number]  CC:  "I feel terrible, I think I need to be admitted or get some antibiotics or something"  HPI JAYONNA MEYERING presents for BP check, lab draw for K (determine if supplement necessary), and to meet new PCP.  SHe is not feeling well unfortunately and describes nasal congestion and pressure, burning stomach sensation, and recounts problems with constipation which eventually responds to Linzess but the intensity of the evacuation leaves her with a burning feeling in her abdomen.  She perceives this pain, though it is lower abdominal, as her acid reflux. She sometimes has nausea and infrequent vomiting of undigested food, though no dysphagia.  The acid blocking medications she has tried in the past have been ineffective, and she stopped the prescribed omeprazole 20 mg long ago due to inefficacy.  It is difficult for her to differentiate upper vs lower abdominal symptoms, as she associates most of her discomfort as subsequent to a "bowel blockage" on the R side some time ago.  "I've never been the same since".  Stools are black (pepto bismol).     Breast ca and lung ca in family - breast ca screening nml 12/12/19 Pneumococcal 13 completed 2018, pneumococcal 23 completed 06/12/2015, Tdap completed 2016, Shingrix completed 2020, Covid vaccinations completed 2021. DEXA 2017 showed osteoporosis, on no treatment currently.  SHe desires flu shot today.  Past Medical History:  Diagnosis Date  . A-fib ([facility])   . Allergy    seasonal  . Asthma   . Chronic anticoagulation   . Eczema   . GERD (gastroesophageal reflux disease)   . History of kidney stones   . Hypertension   . PAF (paroxysmal atrial fibrillation) ([facility]) 11/26/2015   April/May 2017: Multiple ED visits for A. Fib.  02/12/16: Improved on diltiazem IV. Cardiology consulted in the ED and discharge patient with diltiazem XR 120 mg  once daily with 60 mg by mouth every 6 hours as needed for symptomatic palpitations, flecainide 50 mg twice daily. Referred to the atrial fibrillation clinic.  02/17/16: Seen in the atrial fibrillation clinic by NP Roderic Palau. Normal sin    Past Surgical History:  Procedure Laterality Date  . ATRIAL FIBRILLATION ABLATION N/A 09/30/2017   Procedure: ATRIAL FIBRILLATION ABLATION;  Surgeon: Thompson Grayer, MD;  Location: [facility];  Service: Cardiovascular;  Laterality: N/A;  . CHOLECYSTECTOMY N/A 12/08/2018   Procedure: LAPAROSCOPIC CHOLECYSTECTOMY WITH INTRAOPERATIVE CHOLANGIOGRAM;  Surgeon: Jovita Kussmaul, MD;  Location: [facility];  Service: General;  Laterality: N/A;  . ERCP N/A 12/09/2018   Procedure: ENDOSCOPIC RETROGRADE CHOLANGIOPANCREATOGRAPHY (ERCP);  Surgeon: Carol Ada, MD;  Location: [facility];  Service: Endoscopy;  Laterality: N/A;  . SPHINCTEROTOMY  12/09/2018   Procedure: SPHINCTEROTOMY;  Surgeon: Carol Ada, MD;  Location: [facility];  Service: Endoscopy;;  . TONSILLECTOMY    . TUBAL LIGATION     ROS Review of Systems  Objective:   Today's Vitals: There were no vitals taken for this visit.  Physical Exam  Findings under problem based documentation Assessment & Plan:   See problem based charting.  Today's visit was focused on chronic upper respiratory and abdominal symptoms, updating labs.    Outpatient Encounter Medications as of 07/24/2020  Medication Sig  . acetaminophen (TYLENOL) 500 MG tablet Take 2 tablets (1,000 mg total) by mouth every 8 (eight) hours as needed. (Patient taking differently:  Take 1,000 mg by mouth as needed. )  . Bismuth Subsalicylate (PEPTO-BISMOL PO) Take by mouth. Taking by mouth once daily- Liquid form  . Calcium Carbonate Antacid (TUMS PO) Take by mouth. Taking by mouth 3 chewables daily  . Capsaicin 0.1 % CREA Apply 1 g topically 3 (three) times daily as needed.  . cetirizine (ZYRTEC) 10 MG tablet Take 10 mg by mouth daily.  Marland Kitchen  diltiazem (CARDIZEM) 60 MG tablet Take 1 tablet (60 mg total) by mouth every 6 (six) hours as needed (Rapid AFIB over 100).  Marland Kitchen diltiazem (CARTIA XT) 120 MG 24 hr capsule Take 1 capsule (120 mg total) by mouth 2 (two) times daily.  . famotidine (PEPCID) 40 MG tablet Take 40 mg by mouth daily.  . fluticasone (FLOVENT HFA) 44 MCG/ACT inhaler Inhale 1 puff into the lungs daily.  Marland Kitchen levalbuterol (XOPENEX HFA) 45 MCG/ACT inhaler INHALE 1 PUFF INTO THE LUNGS EVERY 6 HOURS AS NEEDED FOR SHORTNESS OF BREATH  . levocetirizine (XYZAL) 5 MG tablet Take 1 tablet (5 mg total) by mouth every evening.  . linaclotide (LINZESS) 145 MCG CAPS capsule TAKE 1 CAPSULE(145 MCG) BY MOUTH DAILY BEFORE BREAKFAST  . losartan (COZAAR) 50 MG tablet TAKE 1 TABLET(50 MG) BY MOUTH DAILY  . magnesium hydroxide (MILK OF MAGNESIA) 400 MG/5ML suspension Take 15-30 mLs by mouth at bedtime as needed for mild constipation.  . methocarbamol (ROBAXIN) 500 MG tablet Take 1 tablet (500 mg total) by mouth 2 (two) times daily.  . montelukast (SINGULAIR) 10 MG tablet Take 10 mg by mouth at bedtime.  Marland Kitchen Phenir-PE-Sod Sal-Caff Cit (COUGH/COLD MEDICINE PO) Take by mouth. Liquid form- Using the daily- taking by mouth once daily  . potassium chloride (KLOR-CON) 10 MEQ tablet Take 2 tablets (20 mEq total) by mouth 2 (two) times daily.  . rivaroxaban (XARELTO) 20 MG TABS tablet Take 1 tablet (20 mg total) by mouth daily with supper.  . tiotropium (SPIRIVA) 18 MCG inhalation capsule Place 1 capsule (18 mcg total) into inhaler and inhale daily.  Marland Kitchen triamcinolone cream (KENALOG) 0.1 % Apply 1 application topically 2 (two) times daily. Mix 1:1 with Eucerin   No facility-administered encounter medications on file as of 07/24/2020.    Follow-up: No follow-ups on file.   Angelica Pou, MD

## 2020-07-24 ENCOUNTER — Ambulatory Visit (INDEPENDENT_AMBULATORY_CARE_PROVIDER_SITE_OTHER): Payer: Medicare Other | Admitting: Internal Medicine

## 2020-07-24 ENCOUNTER — Other Ambulatory Visit: Payer: Self-pay

## 2020-07-24 ENCOUNTER — Encounter: Payer: Self-pay | Admitting: Internal Medicine

## 2020-07-24 VITALS — BP 163/91 | HR 74 | Temp 97.7°F | Ht 67.0 in | Wt 149.2 lb

## 2020-07-24 DIAGNOSIS — Z23 Encounter for immunization: Secondary | ICD-10-CM | POA: Diagnosis not present

## 2020-07-24 DIAGNOSIS — K219 Gastro-esophageal reflux disease without esophagitis: Secondary | ICD-10-CM

## 2020-07-24 DIAGNOSIS — D6869 Other thrombophilia: Secondary | ICD-10-CM

## 2020-07-24 DIAGNOSIS — I4891 Unspecified atrial fibrillation: Secondary | ICD-10-CM

## 2020-07-24 DIAGNOSIS — G8929 Other chronic pain: Secondary | ICD-10-CM | POA: Insufficient documentation

## 2020-07-24 DIAGNOSIS — H6123 Impacted cerumen, bilateral: Secondary | ICD-10-CM | POA: Diagnosis not present

## 2020-07-24 DIAGNOSIS — I10 Essential (primary) hypertension: Secondary | ICD-10-CM | POA: Diagnosis not present

## 2020-07-24 DIAGNOSIS — J4520 Mild intermittent asthma, uncomplicated: Secondary | ICD-10-CM

## 2020-07-24 DIAGNOSIS — M810 Age-related osteoporosis without current pathological fracture: Secondary | ICD-10-CM | POA: Diagnosis not present

## 2020-07-24 DIAGNOSIS — E782 Mixed hyperlipidemia: Secondary | ICD-10-CM

## 2020-07-24 DIAGNOSIS — I480 Paroxysmal atrial fibrillation: Secondary | ICD-10-CM

## 2020-07-24 DIAGNOSIS — J302 Other seasonal allergic rhinitis: Secondary | ICD-10-CM

## 2020-07-24 DIAGNOSIS — Z8679 Personal history of other diseases of the circulatory system: Secondary | ICD-10-CM

## 2020-07-24 DIAGNOSIS — R1030 Lower abdominal pain, unspecified: Secondary | ICD-10-CM | POA: Diagnosis not present

## 2020-07-24 DIAGNOSIS — Z9889 Other specified postprocedural states: Secondary | ICD-10-CM

## 2020-07-24 DIAGNOSIS — R1031 Right lower quadrant pain: Secondary | ICD-10-CM | POA: Insufficient documentation

## 2020-07-24 DIAGNOSIS — K5909 Other constipation: Secondary | ICD-10-CM

## 2020-07-24 MED ORDER — AZELASTINE-FLUTICASONE 137-50 MCG/ACT NA SUSP
NASAL | 5 refills | Status: DC
Start: 2020-07-24 — End: 2021-09-22

## 2020-07-24 MED ORDER — PANTOPRAZOLE SODIUM 40 MG PO TBEC
40.0000 mg | DELAYED_RELEASE_TABLET | Freq: Every day | ORAL | 5 refills | Status: DC
Start: 2020-07-24 — End: 2021-02-03

## 2020-07-25 ENCOUNTER — Encounter: Payer: Self-pay | Admitting: Internal Medicine

## 2020-07-25 ENCOUNTER — Telehealth: Payer: Self-pay | Admitting: *Deleted

## 2020-07-25 DIAGNOSIS — J3089 Other allergic rhinitis: Secondary | ICD-10-CM | POA: Insufficient documentation

## 2020-07-25 DIAGNOSIS — J302 Other seasonal allergic rhinitis: Secondary | ICD-10-CM | POA: Insufficient documentation

## 2020-07-25 DIAGNOSIS — J4520 Mild intermittent asthma, uncomplicated: Secondary | ICD-10-CM | POA: Insufficient documentation

## 2020-07-25 LAB — LIPID PANEL
Chol/HDL Ratio: 3.8 ratio (ref 0.0–4.4)
Cholesterol, Total: 296 mg/dL — ABNORMAL HIGH (ref 100–199)
HDL: 78 mg/dL (ref >39)
LDL Chol Calc (NIH): 198 mg/dL — ABNORMAL HIGH (ref 0–99)
Triglycerides: 116 mg/dL (ref 0–149)
VLDL Cholesterol Cal: 20 mg/dL (ref 5–40)

## 2020-07-25 LAB — BMP8+ANION GAP
Anion Gap: 18 mmol/L (ref 10.0–18.0)
BUN/Creatinine Ratio: 23 (ref 12–28)
BUN: 15 mg/dL (ref 8–27)
CO2: 20 mmol/L (ref 20–29)
Calcium: 9.7 mg/dL (ref 8.7–10.3)
Chloride: 101 mmol/L (ref 96–106)
Creatinine, Ser: 0.64 mg/dL (ref 0.57–1.00)
GFR calc Af Amer: 104 mL/min/{1.73_m2} (ref >59)
GFR calc non Af Amer: 90 mL/min/{1.73_m2} (ref >59)
Glucose: 81 mg/dL (ref 65–99)
Potassium: 3.6 mmol/L (ref 3.5–5.2)
Sodium: 139 mmol/L (ref 134–144)

## 2020-07-25 LAB — VITAMIN D 25 HYDROXY (VIT D DEFICIENCY, FRACTURES): Vit D, 25-Hydroxy: 19.4 ng/mL — ABNORMAL LOW (ref 30.0–100.0)

## 2020-07-25 MED ORDER — DEBROX 6.5 % OT SOLN: OTIC | 0 refills | Status: DC

## 2020-07-25 MED ORDER — LOSARTAN POTASSIUM 50 MG PO TABS: 75.0000 mg | ORAL_TABLET | Freq: Every day | ORAL | 3 refills | Status: DC

## 2020-07-25 MED ORDER — VITAMIN D 50 MCG (2000 UT) PO TABS: 2000.0000 [IU] | ORAL_TABLET | Freq: Every day | ORAL | Status: DC

## 2020-07-25 MED ORDER — LINACLOTIDE 72 MCG PO CAPS: ORAL_CAPSULE | ORAL | 5 refills | Status: DC

## 2020-07-25 NOTE — Assessment & Plan Note (Addendum)
Was on statin at one time, will review history of this problem. Lipid panel today.

## 2020-07-25 NOTE — Assessment & Plan Note (Signed)
In NSR; approx 3% annual risk of embolic event in unanticoagulated AF pts; recommendation is for ongoing anticoagulation in ablated pts given concern for paroxysmal AF even after procedure.  Low bleeding risk, no problems thus far.  Monitor.

## 2020-07-25 NOTE — Assessment & Plan Note (Signed)
She has infrequent symptoms and rarely uses rescue medications - she cannot recall the most recent time.  She had been using her montelukast only during exacerbations, not realizing it was a daily medication.  She is aware to take it daily now.

## 2020-07-25 NOTE — Assessment & Plan Note (Signed)
On no bisphosphonate or vit D; check vit D level today and explore why Fosamax was stopped in the past.

## 2020-07-25 NOTE — Assessment & Plan Note (Signed)
Uncontrolled; plan to increase losartan from 50 to 75 mg daily.  Goal SBP < 130.

## 2020-07-25 NOTE — Assessment & Plan Note (Addendum)
Very tender in lower abdomen - dull to percussion, nml bowel sounds.  Given her ongoing complaint and exam, had planned to order abd/pelvic CT with contrast, though review of chart reveals that this is a chronic problem which has been evaluated by several CTs in the past, most recently 12/2019.  Has had CT angio as well to r/o perfusion insufficiency.  She states that she's had pain ever since her bowel blockage surgery - in review of the chart, she may be referring to the cholecystectomy.  I can not find evidence of a bowel obstruction but will continue investigating.Marland Kitchen

## 2020-07-25 NOTE — Assessment & Plan Note (Addendum)
Omeprazole 20 ineffective.  Pantoprazole prescribed.  I am not convinced that the description of symptoms represents GERD.  If she doesn't improve with PPI, will stop it for 1-2 weeks and test for H pylori.  Other potentially symptom relieving medication might include carafate liquid.

## 2020-07-25 NOTE — Assessment & Plan Note (Signed)
She continues to have difficulty with bowel function, though Linzess has been effective; however, the results are often uncomfortable, and she feels that the abdominal cramping and burning may be a side effect.  Will reduce dose.

## 2020-07-25 NOTE — Assessment & Plan Note (Signed)
Pt did not raise this as a concern at this visit.

## 2020-07-25 NOTE — Assessment & Plan Note (Signed)
Nasal passages are edematous (nearly occluded) and bluish, smooth wet mucosa, clear drainage.  Cobblestoning appearnance of pharynx which is otherwise normal.  Ear canals impacted with cerumen.  No fever.  She voices dissatisfaction with all antihistamines used thus far.  Needs intranasal steroid as first line therapy, though in combination with azelastine may provide more relief.  Dymista prescribed (may not be covered by insurance; if not, Flonase alone.

## 2020-07-25 NOTE — Telephone Encounter (Signed)
Pt wants to know if you are going to get her K+ called in, she is feeling tired and knows her body needs that K+

## 2020-07-25 NOTE — Telephone Encounter (Signed)
Potassium level is normal after she had stopped it when prescription ran out.   Curiously, she is not on any medication which would cause her to be low, and I can't see a problem of isolated hypokalemia anywhere in her history on first pass review.  Her fatigue isn't due to low potassium.  I would not recommend adding back the potassium at this time.  HOWEVER, I'll check my schedule to see if she could come in for recheck lab and visit with me on 12/23.  :)

## 2020-07-25 NOTE — Assessment & Plan Note (Signed)
Continues to f/u with cardiology for this problem, though has been in NSR.  Remains on anticoagulation.  Query whether this can be discontinued.

## 2020-07-25 NOTE — Assessment & Plan Note (Signed)
Bilateral cerumen impaction impairing hearing; attempt at flushing today with incomplete results, will need Debrox softening.

## 2020-07-28 ENCOUNTER — Telehealth: Payer: Self-pay

## 2020-07-28 NOTE — Telephone Encounter (Signed)
Called pt - stated she feels fatigue, "no energy", off the K+ pills. Stated she has off x 2 weeks and she can tell a difference. Requesting to be back on them.  Thanks

## 2020-07-28 NOTE — Telephone Encounter (Signed)
Pt wants to refill or start back on her potassium medicine ;pt contact [phone]

## 2020-07-28 NOTE — Telephone Encounter (Signed)
Patient is calling back [phone]

## 2020-07-29 NOTE — Telephone Encounter (Signed)
Hi Glenda, she had called last week as well, and has been scheduled in my clinic for 12/23 for a potassium recheck.  Here is a copy of my note to [facility] in response to Ms. Rolf's call last week :  "Potassium level is normal a couple weeks after she had stopped it (when prescription ran out.)    Curiously, she is not on any medication which would cause her to be low, and I can't see a problem of isolated hypokalemia anywhere in her history on first pass review.   Her fatigue isn't due to low potassium.  I would not recommend adding back the potassium at this time.  HOWEVER, I'll check my schedule to see if she could come in for recheck lab and visit with me on 12/23."

## 2020-07-30 NOTE — Telephone Encounter (Signed)
Called pt - no answer; left Dr Jimmye Norman' response on pt 's self-identified vm and to call for any questions. "Her fatigue isn't due to low potassium. I would not recommend adding back the potassium at this time. HOWEVER, I'll check my schedule to see if she could come in for recheck lab and visit with me on 12/23."  Per Epic , pt has an appt scheduled on this date.

## 2020-08-11 ENCOUNTER — Other Ambulatory Visit: Payer: Self-pay

## 2020-08-11 ENCOUNTER — Emergency Department (HOSPITAL_COMMUNITY)
Admission: EM | Admit: 2020-08-11 | Discharge: 2020-08-11 | Disposition: A | Discharge status: Home / Self Care | Payer: Medicare Other | Attending: Emergency Medicine | Admitting: Emergency Medicine

## 2020-08-11 ENCOUNTER — Emergency Department (HOSPITAL_COMMUNITY): Payer: Medicare Other

## 2020-08-11 DIAGNOSIS — Z7951 Long term (current) use of inhaled steroids: Secondary | ICD-10-CM | POA: Insufficient documentation

## 2020-08-11 DIAGNOSIS — K7689 Other specified diseases of liver: Secondary | ICD-10-CM | POA: Diagnosis not present

## 2020-08-11 DIAGNOSIS — M546 Pain in thoracic spine: Secondary | ICD-10-CM | POA: Diagnosis not present

## 2020-08-11 DIAGNOSIS — Z87891 Personal history of nicotine dependence: Secondary | ICD-10-CM | POA: Insufficient documentation

## 2020-08-11 DIAGNOSIS — I700 Atherosclerosis of aorta: Secondary | ICD-10-CM | POA: Diagnosis not present

## 2020-08-11 DIAGNOSIS — J45909 Unspecified asthma, uncomplicated: Secondary | ICD-10-CM | POA: Insufficient documentation

## 2020-08-11 DIAGNOSIS — Z7901 Long term (current) use of anticoagulants: Secondary | ICD-10-CM | POA: Insufficient documentation

## 2020-08-11 DIAGNOSIS — I10 Essential (primary) hypertension: Secondary | ICD-10-CM | POA: Diagnosis not present

## 2020-08-11 DIAGNOSIS — Z79899 Other long term (current) drug therapy: Secondary | ICD-10-CM | POA: Diagnosis not present

## 2020-08-11 DIAGNOSIS — N200 Calculus of kidney: Secondary | ICD-10-CM | POA: Insufficient documentation

## 2020-08-11 DIAGNOSIS — K5900 Constipation, unspecified: Secondary | ICD-10-CM | POA: Insufficient documentation

## 2020-08-11 DIAGNOSIS — J019 Acute sinusitis, unspecified: Secondary | ICD-10-CM | POA: Diagnosis not present

## 2020-08-11 DIAGNOSIS — M47816 Spondylosis without myelopathy or radiculopathy, lumbar region: Secondary | ICD-10-CM | POA: Diagnosis not present

## 2020-08-11 DIAGNOSIS — R519 Headache, unspecified: Secondary | ICD-10-CM | POA: Diagnosis present

## 2020-08-11 DIAGNOSIS — J309 Allergic rhinitis, unspecified: Secondary | ICD-10-CM

## 2020-08-11 LAB — CBC
HCT: 40.9 % (ref 36.0–46.0)
Hemoglobin: 12.9 g/dL (ref 12.0–15.0)
MCH: 26.1 pg (ref 26.0–34.0)
MCHC: 31.5 g/dL (ref 30.0–36.0)
MCV: 82.8 fL (ref 80.0–100.0)
Platelets: 288 K/uL (ref 150–400)
RBC: 4.94 MIL/uL (ref 3.87–5.11)
RDW: 16 % — ABNORMAL HIGH (ref 11.5–15.5)
WBC: 4 K/uL (ref 4.0–10.5)
nRBC: 0 % (ref 0.0–0.2)

## 2020-08-11 LAB — URINALYSIS, ROUTINE W REFLEX MICROSCOPIC
Bacteria, UA: NONE SEEN
Bilirubin Urine: NEGATIVE
Glucose, UA: NEGATIVE mg/dL
Hgb urine dipstick: NEGATIVE
Ketones, ur: NEGATIVE mg/dL
Leukocytes,Ua: NEGATIVE
Nitrite: NEGATIVE
Protein, ur: 30 mg/dL — AB
Specific Gravity, Urine: 1.008 (ref 1.005–1.030)
pH: 6 (ref 5.0–8.0)

## 2020-08-11 LAB — BASIC METABOLIC PANEL WITH GFR
Anion gap: 11 (ref 5–15)
BUN: 10 mg/dL (ref 8–23)
CO2: 22 mmol/L (ref 22–32)
Calcium: 9.8 mg/dL (ref 8.9–10.3)
Chloride: 106 mmol/L (ref 98–111)
Creatinine, Ser: 0.71 mg/dL (ref 0.44–1.00)
GFR, Estimated: >60 mL/min (ref >60)
Glucose, Bld: 102 mg/dL — ABNORMAL HIGH (ref 70–99)
Potassium: 3.4 mmol/L — ABNORMAL LOW (ref 3.5–5.1)
Sodium: 139 mmol/L (ref 135–145)

## 2020-08-11 MED ORDER — HYDROCODONE-ACETAMINOPHEN 5-325 MG PO TABS: 2.0000 | ORAL_TABLET | ORAL | 0 refills | Status: DC | PRN

## 2020-08-11 MED ORDER — PREDNISONE 20 MG PO TABS: 40.0000 mg | ORAL_TABLET | Freq: Every day | ORAL | 0 refills | Status: DC

## 2020-08-11 MED ORDER — ONDANSETRON HCL 4 MG/2ML IJ SOLN
4.0000 mg | Freq: Once | INTRAMUSCULAR | Status: AC
  Administered 2020-08-11: 11:00:00 4 mg via INTRAVENOUS
  Filled 2020-08-11: qty 2

## 2020-08-11 MED ORDER — FENTANYL CITRATE (PF) 100 MCG/2ML IJ SOLN
50.0000 ug | Freq: Once | INTRAMUSCULAR | Status: AC
  Administered 2020-08-11: 11:00:00 50 ug via INTRAVENOUS
  Filled 2020-08-11: qty 2

## 2020-08-11 MED ORDER — LACTULOSE 10 G PO PACK: 10.0000 g | PACK | Freq: Two times a day (BID) | ORAL | 1 refills | Status: DC

## 2020-08-11 NOTE — ED Notes (Signed)
Patient transported to CT 

## 2020-08-11 NOTE — ED Triage Notes (Addendum)
Patient arrives with c/o of worsening back pain and headache. The headache has been ongoing for 2 weeks and, however, the back pain has worsened over the past 2 days. She has taken tylenol and ibuprofen with minimal relief.    Patient also suspects she may have a UTI due to her urine color becoming light brown.

## 2020-08-11 NOTE — Discharge Instructions (Signed)
Your CAT scan and lab test today look normal.  You do have earwax in both ears that is pretty substantial.  If you wanted to start using peroxide and water half-and-half and putting it in the ear to loosen the wax it will make it easier for them to clean your ears on Thursday.  There is no sign of anything wrong with your bowel or signs of a kidney stone today.  You have no signs of urinary tract infection.  We prescribed you lactulose to see if that will help with your daily constipation but do not mix it with MiraLAX or the Linzess.  Also you were given a short prescription for some pain medication to use judiciously if you have severe pain and also a prescription for prednisone for your sinuses.

## 2020-08-11 NOTE — ED Provider Notes (Signed)
[facility] EMERGENCY DEPARTMENT Provider Note   CSN: [id number] Arrival date & time: 08/11/20  0725     History Chief Complaint  Patient presents with  . Headache  . Tailbone Pain    [name] is a 69 y.o. female.  Patient is a 69 year old female with a history of atrial fibrillation on Xarelto, asthma, chronic allergies and headaches, kidney stones, chronic constipation on Linzess who is presenting today with 2 separate complaints.  Patient reports for the last 2 weeks she has been having facial pain behind her eyes, nasal congestion and headaches.  She denies any vision changes, vomiting or fever.  She did see her PCP last week and she was started on a nasal spray but reports that it is not helping and she feels like it might be a little worse.  She is also been trying to take cetirizine but does not feel like it is getting better.  She has not had change in her nasal discharge or bloody nasal discharge.  She denies a cough or shortness of breath.  Secondly she reports 2 days ago she started experiencing left-sided back pain and yesterday it radiated into her left side and became much more severe.  She reports the pain is still present when she woke up this morning and it was not improved with Tylenol or ibuprofen.  She is still having the pain in her left flank that is radiating down to her left side.  She felt like her urine looked dark yesterday but denies any dysuria or frequency.  She has had diarrhea at home but feels that is related to taking the MiraLAX and Linzess.  That has been more of a chronic problem.  She also reports a history of chronic abdominal pain but reports it is usually on the right side and she does not typically have pain on the left side.  She reports her pain is currently a 10 out of 10.  She denies any unilateral leg weakness or numbness.  The pain does not radiate down the leg.  The history is provided by the patient.  Headache Pain  location:  Frontal Quality:  Dull Radiates to:  Does not radiate Severity currently:  3/10 Onset quality:  Gradual Duration:  2 weeks Timing:  Constant Progression:  Unchanged Chronicity:  Chronic Similar to prior headaches: yes        Past Medical History:  Diagnosis Date  . Asthma in adult, mild intermittent, uncomplicated   . Chronic anticoagulation   . Chronic constipation   . Eczema   . GERD (gastroesophageal reflux disease)   . History of kidney stones   . Hypertension   . Personal history of atrial fibrillation, s/p ablation, now in NSR but remains on anticoaulation 11/26/2015   Followed in cardiology clinic     Patient Active Problem List   Diagnosis Date Noted  . Chronic seasonal allergic rhinitis 07/25/2020  . Asthma in adult, mild intermittent, uncomplicated   . Lower abdominal pain, unspecified 07/24/2020  . 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 ([place]) 07/23/2020  . Chronic lower back pain 02/14/2020  . Hx of vaginal bleeding, resolved, ultrasound completed 10/11/2018  . History of radiofrequency ablation (RFA) procedure for cardiac arrhythmia   . Primary osteoarthritis involving multiple joints 06/14/2018  . Hyperpigmented skin lesion 09/09/2017  . Osteoporosis without current pathological fracture 04/15/2017  . Constipation, chronic 10/19/2016  . Bilateral hearing loss due to cerumen impaction 09/10/2016  . Hyperlipidemia 03/18/2016  . HTN (  hypertension), benign   . GERD (gastroesophageal reflux disease)   . Personal history of atrial fibrillation, now s/p ablation in NSR 11/26/2015  . Marijuana abuse 06/11/2015    Past Surgical History:  Procedure Laterality Date  . ATRIAL FIBRILLATION ABLATION N/A 09/30/2017   Procedure: ATRIAL FIBRILLATION ABLATION;  Surgeon: Thompson Grayer, MD;  Location: [facility];  Service: Cardiovascular;  Laterality: N/A;  . CHOLECYSTECTOMY N/A 12/08/2018   Procedure: LAPAROSCOPIC CHOLECYSTECTOMY WITH  INTRAOPERATIVE CHOLANGIOGRAM;  Surgeon: Jovita Kussmaul, MD;  Location: [facility];  Service: General;  Laterality: N/A;  . ERCP N/A 12/09/2018   Procedure: ENDOSCOPIC RETROGRADE CHOLANGIOPANCREATOGRAPHY (ERCP);  Surgeon: Carol Ada, MD;  Location: [facility];  Service: Endoscopy;  Laterality: N/A;  . SPHINCTEROTOMY  12/09/2018   Procedure: SPHINCTEROTOMY;  Surgeon: Carol Ada, MD;  Location: [facility];  Service: Endoscopy;;  . TONSILLECTOMY    . TUBAL LIGATION       OB History   No obstetric history on file.     Family History  Problem Relation Age of Onset  . Heart attack Mother        [name] age  . Breast cancer Sister        Late 73s; now s/p mastectomy   . Lung cancer Brother        x 2  . Cancer Sister   . Cancer Sister   . Cancer Sister   . Colon cancer Neg Hx   . Colon polyps Neg Hx   . Esophageal cancer Neg Hx   . Rectal cancer Neg Hx   . Stomach cancer Neg Hx     Social History   Tobacco Use  . Smoking status: Former Smoker    Quit date: 10/24/2015    Years since quitting: 4.8  . Smokeless tobacco: Never Used  Vaping Use  . Vaping Use: Never used  Substance Use Topics  . Alcohol use: Not Currently  . Drug use: Not Currently    Types: Marijuana    Comment: Smoked marijuana in the past.    Home Medications Prior to Admission medications   Medication Sig Start Date End Date Taking? Authorizing Provider  acetaminophen (TYLENOL) 500 MG tablet Take 2 tablets (1,000 mg total) by mouth every 8 (eight) hours as needed. Patient taking differently: Take 1,000 mg by mouth as needed.  05/20/19   Charlesetta Shanks, MD  Azelastine-Fluticasone [id number] MCG/ACT SUSP One spray in each nostril twice a day, every day 07/24/20   Angelica Pou, MD  carbamide peroxide (DEBROX) 6.5 % OTIC solution Place 5 drops into each ear to soften ear wax the night before and the morning of your next clinic appointment. 07/25/20   Angelica Pou, MD  Cholecalciferol (VITAMIN D) 50  MCG (2000 UT) tablet Take 1 tablet (2,000 Units total) by mouth daily. For bone health. 07/25/20   Angelica Pou, MD  diltiazem (CARDIZEM) 60 MG tablet Take 1 tablet (60 mg total) by mouth every 6 (six) hours as needed (Rapid AFIB over 100). 05/09/20   Sherran Needs, NP  diltiazem (CARTIA XT) 120 MG 24 hr capsule Take 1 capsule (120 mg total) by mouth 2 (two) times daily. 01/22/20   Sherran Needs, NP  fluticasone (FLOVENT HFA) 44 MCG/ACT inhaler Inhale 1 puff into the lungs daily. 04/02/20   Angelica Pou, MD  levalbuterol [facility] HFA) 45 MCG/ACT inhaler INHALE 1 PUFF INTO THE LUNGS EVERY 6 HOURS AS NEEDED FOR SHORTNESS OF BREATH 04/02/20  Angelica Pou, MD  linaclotide [facility]) 72 MCG capsule TAKE 1 CAPSULE(145 MCG) BY MOUTH DAILY BEFORE BREAKFAST 07/25/20   Angelica Pou, MD  losartan (COZAAR) 50 MG tablet Take 1.5 tablets (75 mg total) by mouth daily. 07/25/20 12/07/20  Angelica Pou, MD  montelukast (SINGULAIR) 10 MG tablet Take 10 mg by mouth at bedtime.    [provider]  pantoprazole (PROTONIX) 40 MG tablet Take 1 tablet (40 mg total) by mouth daily. 07/24/20   Angelica Pou, MD  rivaroxaban (XARELTO) 20 MG TABS tablet Take 1 tablet (20 mg total) by mouth daily with supper. 11/07/19   Sherran Needs, NP  tiotropium (SPIRIVA) 18 MCG inhalation capsule Place 1 capsule (18 mcg total) into inhaler and inhale daily. 04/16/20   Angelica Pou, MD    Allergies    Albuterol, Percocet [oxycodone-acetaminophen], Celecoxib, and Protonix [pantoprazole sodium]  Review of Systems   Review of Systems  Neurological: Positive for headaches.  All other systems reviewed and are negative.   Physical Exam Updated Vital Signs BP (!) 155/101 (BP Location: Left Arm)   Pulse 96   Temp 98.1 F (36.7 C) (Oral)   Resp 20   Ht 5\' 7"  (1.702 m)   Wt 68 kg   SpO2 96%   BMI 23.49 kg/m   Physical Exam Vitals and nursing note reviewed.   Constitutional:      General: She is not in acute distress.    Appearance: She is well-developed, normal weight and well-nourished.  HENT:     Head: Normocephalic and atraumatic.     Right Ear: There is impacted cerumen.     Left Ear: There is impacted cerumen.     Ears:     Comments: Bilateral cerumen impaction    Nose:     Right Turbinates: Enlarged.     Left Turbinates: Enlarged.     Mouth/Throat:     Mouth: Mucous membranes are moist.  Eyes:     Extraocular Movements: EOM normal.     Pupils: Pupils are equal, round, and reactive to light.  Cardiovascular:     Rate and Rhythm: Regular rhythm. Tachycardia present.     Pulses: Normal pulses and intact distal pulses.     Heart sounds: Normal heart sounds. No murmur heard. No friction rub.  Pulmonary:     Effort: Pulmonary effort is normal.     Breath sounds: Normal breath sounds. No wheezing or rales.  Abdominal:     General: Bowel sounds are normal. There is no distension.     Palpations: Abdomen is soft.     Tenderness: There is abdominal tenderness in the left lower quadrant. There is left CVA tenderness. There is no right CVA tenderness, guarding or rebound.  Musculoskeletal:        General: No tenderness. Normal range of motion.     Cervical back: Normal range of motion.     Comments: No edema  Skin:    General: Skin is warm and dry.     Capillary Refill: Capillary refill takes less than 2 seconds.     Findings: No rash.  Neurological:     General: No focal deficit present.     Mental Status: She is alert and oriented to person, place, and time. Mental status is at baseline.     Cranial Nerves: No cranial nerve deficit.  Psychiatric:        Mood and Affect: Mood and affect and mood normal.  Behavior: Behavior normal.        Thought Content: Thought content normal.     ED Results / Procedures / Treatments   Labs (all labs ordered are listed, but only abnormal results are displayed) Labs Reviewed   URINALYSIS, ROUTINE W REFLEX MICROSCOPIC - Abnormal; Notable for the following components:      Result Value   Protein, ur 30 (*)    All other components within normal limits  CBC - Abnormal; Notable for the following components:   RDW 16.0 (*)    All other components within normal limits  BASIC METABOLIC PANEL - Abnormal; Notable for the following components:   Potassium 3.4 (*)    Glucose, Bld 102 (*)    All other components within normal limits    EKG None  Radiology CT Renal Stone Study  Result Date: 08/11/2020 CLINICAL DATA:  Flank pain, kidney stone suspected. EXAM: CT ABDOMEN AND PELVIS WITHOUT CONTRAST TECHNIQUE: Multidetector CT imaging of the abdomen and pelvis was performed following the standard protocol without IV contrast. COMPARISON:  CT abdomen and pelvis from November 30, 2019. FINDINGS: Lower chest: No acute abnormality. There appears to be a nodule in the right lower lobe, but is incompletely imaged. Please see CT of the chest from 05/29/2017 for further characterization and follow-up recommendations. Hepatobiliary: Similar appearance of multiple subcentimeter hepatic cysts. Cholecystectomy. Pancreas: Unremarkable. No pancreatic ductal dilatation or surrounding inflammatory changes. Spleen: Normal in size without focal abnormality. Adrenals/Urinary Tract: Adrenal glands are unremarkable. Similar punctate right renal calculus. No hydronephrosis. No evidence of a ureteral calculus. Bladder is unremarkable. Stomach/Bowel: Stomach is within normal limits. Appendix is not confidently visualized but there is no right lower quadrant inflammatory change to suggest acute appendicitis. No evidence of bowel wall thickening, distention, or inflammatory changes. Moderate colonic stool burden Vascular/Lymphatic: Aorto bi-iliac calcific atherosclerosis. Right inguinal lymph node is slightly decreased in size, now measuring approximately 8 mm in short axis. Reproductive: Uterus and bilateral  adnexa are unremarkable. Other: No abdominal wall hernia or abnormality. No abdominopelvic ascites. Musculoskeletal: No evidence of acute fracture. Similar multilevel degenerative change of the lumbar spine with disc bulges, multilevel vacuum disc phenomenon, and multilevel degenerative Schmorl's nodes. IMPRESSION: Similar nonobstructing punctate right renal calculus without evidence of a ureteral calculus or hydronephrosis. Electronically Signed   By: Margaretha Sheffield MD   On: 08/11/2020 10:51    Procedures Procedures (including critical care time)  Medications Ordered in ED Medications  fentaNYL (SUBLIMAZE) injection 50 mcg (has no administration in time range)  ondansetron (ZOFRAN) injection 4 mg (has no administration in time range)    ED Course  I have reviewed the triage vital signs and the nursing notes.  Pertinent labs & imaging results that were available during my care of the patient were reviewed by me and considered in my medical decision making (see chart for details).    MDM Rules/Calculators/A&P                          Elderly female presenting with 2 separate complaints.  Patient's left flank and side pain sound more concerning for kidney stone today with report of dark urine at home.  She reports still having 10 out of 10 pain that is not improved with Tylenol and ibuprofen.  Patient does have a history of chronic abdominal pain and has had multiple CTs in the past but reports the pain is always been on the right side in the past  which she relates to a bowel obstruction she had years ago.  Patient is denying vomiting or new stool suggestive of obstruction.  She is having no right-sided abdominal pain with low suspicion for appendicitis and she has had a prior cholecystectomy.  Low suspicion at this time for an acute Gyn issue.  Patient is otherwise hemodynamically stable but is mildly tachycardic and mildly hypertensive which may be related to pain.  CBC, BMP and urine all  without acute findings.  We will do a CT renal to evaluate for stones.  In the past and may patient did have a CTA of her abdomen and pelvis which showed no evidence of perfusion deficit but again she reports the pain today is different. Secondly patient is complaining of facial pain and nasal congestion.  She did see her PCP approximately 2 weeks ago for this and symptoms sound similar based on the prior note.  Patient was started on Flonase and appears to be taking Singulair but reports symptoms are not improving.  She has no facial swelling or significant sinus tenderness concerning for acute bacterial infection.  Suspect this is allergy related.  11:27 AM Patient's CT renal study is negative for acute obstructing stone and no other acute process at this time.  On reevaluation patient does feel better.  Unclear if the back pain is related to an already passed stone versus abdominal cramping.  She does not have symptoms suggestive of lumbar radiculopathy at this time.  She reports that the Linzess causes such terrible cramping and diarrhea that she thinks may be that is what caused her abdominal pain.  She has tried MiraLAX every day but reports that does not improve the constipation and she is requesting a different medication to try.  We will try lactulose to see if that improves her daily bowel movements so she does not have to take the [place].  We will also try a short course of prednisone to see if that helps with the inflammation from her allergies and sinuses.  There is no evidence of bacterial sinusitis today.  MDM Number of Diagnoses or Management Options   Amount and/or Complexity of Data Reviewed Clinical lab tests: ordered and reviewed Tests in the radiology section of CPT: ordered and reviewed Decide to obtain previous medical records or to obtain history from someone other than the patient: yes Obtain history from someone other than the patient: no Review and summarize past medical  records: yes Discuss the patient with other providers: no Independent visualization of images, tracings, or specimens: yes  Risk of Complications, Morbidity, and/or Mortality Presenting problems: moderate Diagnostic procedures: low Management options: low  Patient Progress Patient progress: improved    Final Clinical Impression(s) / ED Diagnoses Final diagnoses:  Allergic sinusitis  Acute left-sided thoracic back pain  Constipation, unspecified constipation type    Rx / DC Orders ED Discharge Orders         Ordered    HYDROcodone-acetaminophen (NORCO/VICODIN) 5-325 MG tablet  Every 4 hours PRN        08/11/20 1135    lactulose (CEPHULAC) 10 g packet  2 times daily        08/11/20 1135    predniSONE (DELTASONE) 20 MG tablet  Daily        08/11/20 1135           Blanchie Dessert, MD 08/11/20 1136

## 2020-08-11 NOTE — ED Notes (Signed)
Pt c/o 10/10 sharp pain in left lower back that radiates to left flank and into left pelvic area.

## 2020-08-14 ENCOUNTER — Encounter: Payer: Medicare Other | Admitting: Internal Medicine

## 2020-08-27 NOTE — Assessment & Plan Note (Deleted)
At last visit, Linzess dose reduced to address the uncomfortable cramping and pain associated with results.  EDP in 07/2020 prescribed lactulose.

## 2020-08-27 NOTE — Progress Notes (Deleted)
Established Patient Office Visit  Subjective:  Patient ID: [name], female    DOB: September 19, 1950  Age: 70 y.o. MRN: [id number]  CC:   HPI [name] presents for f/u of allergic rhinitis symptoms (Dimysta prescribed last visit, though she presented to ED last month for uncontrolled sxs - no antibiotics, prednisone prescribed), potassium level (monitoring to determine if supplemental K is needed (I had stopped this empirically as she is not taking any potassium-wasting medications), and results of her lipid panel and vit D status with respect to dx of osteoporosis, not currently on bisphosphonate.  SHe also has poorly characterized abdominal pain which she attributes to hx of bowel obstruction (no reference to such in the chart).  I had decreased her Linzess dose as she was continuing to experience constipation, though results with Linzess were very uncomfortable.  EDP prescribed lactulose last month.      Breast ca and lung ca in family - breast ca screening nml 12/12/19 Pneumococcal 13 completed 2018), pneumococcal 23 completed 06/12/2015, Tdap completed 2016, Shingrix completed 2020, Covid vaccinations completed 2021 (no booster?) . DEXA 2017 showed osteoporosis, on no treatment currently.  Flu shot Fall 2021.   Family History  Problem Relation Age of Onset  . Heart attack Mother        [place] age  . Breast cancer Sister        Late 63s; now s/p mastectomy   . Lung cancer Brother        x 2  . Cancer Sister   . Cancer Sister   . Cancer Sister   . Colon cancer Neg Hx   . Colon polyps Neg Hx   . Esophageal cancer Neg Hx   . Rectal cancer Neg Hx   . Stomach cancer Neg Hx       Outpatient Medications Prior to Visit  Medication Sig Dispense Refill  . acetaminophen (TYLENOL) 500 MG tablet Take 2 tablets (1,000 mg total) by mouth every 8 (eight) hours as needed. (Patient taking differently: Take 1,000 mg by mouth as needed. ) 30 tablet 0  . Azelastine-Fluticasone 137-50  MCG/ACT SUSP One spray in each nostril twice a day, every day 23 g 5  . carbamide peroxide (DEBROX) 6.5 % OTIC solution Place 5 drops into each ear to soften ear wax the night before and the morning of your next clinic appointment. 15 mL 0  . Cholecalciferol (VITAMIN D) 50 MCG (2000 UT) tablet Take 1 tablet (2,000 Units total) by mouth daily. For bone health.    . diltiazem (CARDIZEM) 60 MG tablet Take 1 tablet (60 mg total) by mouth every 6 (six) hours as needed (Rapid AFIB over 100). 45 tablet 1  . diltiazem (CARTIA XT) 120 MG 24 hr capsule Take 1 capsule (120 mg total) by mouth 2 (two) times daily. 180 capsule 2  . fluticasone (FLOVENT HFA) 44 MCG/ACT inhaler Inhale 1 puff into the lungs daily. 30.8 g 1  . HYDROcodone-acetaminophen (NORCO/VICODIN) 5-325 MG tablet Take 2 tablets by mouth every 4 (four) hours as needed. 10 tablet 0  . lactulose (CEPHULAC) 10 g packet Take 1 packet (10 g total) by mouth 2 (two) times daily. Start with taking 1 packet 2 times a day and if that does not produce soft bowel movements daily you can increase it to 3 times a day 30 each 1  . levalbuterol (XOPENEX HFA) 45 MCG/ACT inhaler INHALE 1 PUFF INTO THE LUNGS EVERY 6 HOURS AS NEEDED FOR SHORTNESS  OF BREATH 45 g 1  . linaclotide (LINZESS) 72 MCG capsule TAKE 1 CAPSULE(145 MCG) BY MOUTH DAILY BEFORE BREAKFAST 90 capsule 5  . losartan (COZAAR) 50 MG tablet Take 1.5 tablets (75 mg total) by mouth daily. 135 tablet 3  . montelukast (SINGULAIR) 10 MG tablet Take 10 mg by mouth at bedtime.    . pantoprazole (PROTONIX) 40 MG tablet Take 1 tablet (40 mg total) by mouth daily. 30 tablet 5  . predniSONE (DELTASONE) 20 MG tablet Take 2 tablets (40 mg total) by mouth daily. 10 tablet 0  . rivaroxaban (XARELTO) 20 MG TABS tablet Take 1 tablet (20 mg total) by mouth daily with supper. 30 tablet 6  . tiotropium (SPIRIVA) 18 MCG inhalation capsule Place 1 capsule (18 mcg total) into inhaler and inhale daily. 90 capsule 1   No  facility-administered medications prior to visit.      ROS     Objective:    Physical Exam                      Assessment & Plan:  Osteoporosis with hypovitaminosis D: Increase dosage of vitamin D supplementation daily.  Discussed Fosamax. Hyperlipidemia, uncontrolled, with very high LDL: Reviewed statin Potassium questions: Repeat BMP today.  Currently on no potassium wasting medication, though she attributes her chronic fatigue to the need for supplements. Chronic severe prolonged seasonal sinusitis treated at last visit with Dymista, evaluated in the ED last month for which prednisone was prescribed. Constipation: Follow-up reduced Linzess versus lactulose Needs Covid booster Hypertension, ARB recently increased, follow-up BP  Follow-up:     Angelica Pou, MD

## 2020-08-27 NOTE — Assessment & Plan Note (Deleted)
HIgher dose VIt D suppelementation to be started to address level 19 last month.  It doesn't appear Fosamax was started as intended a few years ago. Addressed today -

## 2020-08-28 ENCOUNTER — Encounter: Payer: Medicare Other | Admitting: Internal Medicine

## 2020-08-28 ENCOUNTER — Telehealth: Payer: Self-pay

## 2020-08-28 DIAGNOSIS — M810 Age-related osteoporosis without current pathological fracture: Secondary | ICD-10-CM

## 2020-08-28 DIAGNOSIS — D6869 Other thrombophilia: Secondary | ICD-10-CM

## 2020-08-28 DIAGNOSIS — I10 Essential (primary) hypertension: Secondary | ICD-10-CM

## 2020-08-28 DIAGNOSIS — K5909 Other constipation: Secondary | ICD-10-CM

## 2020-08-28 NOTE — Telephone Encounter (Signed)
Thank you for the update, glad she feels better.

## 2020-08-28 NOTE — Telephone Encounter (Signed)
Pt called to cancel her appt she stated she feels better even though she went to the ER .. I tried to reschedule but  She stated she may have to go out of town to be with her brother  Due to him being ill .Marland Kitchen Pt stated she will call back if she needs another appt

## 2020-09-04 IMAGING — CR DG CHEST 2V
2 series · 2 of 2 positions shown · non-contrast
Comparison: Radiograph April 07, 2019.

CLINICAL DATA: Chest pain.

EXAM:
CHEST - 2 VIEW

[chest pa]
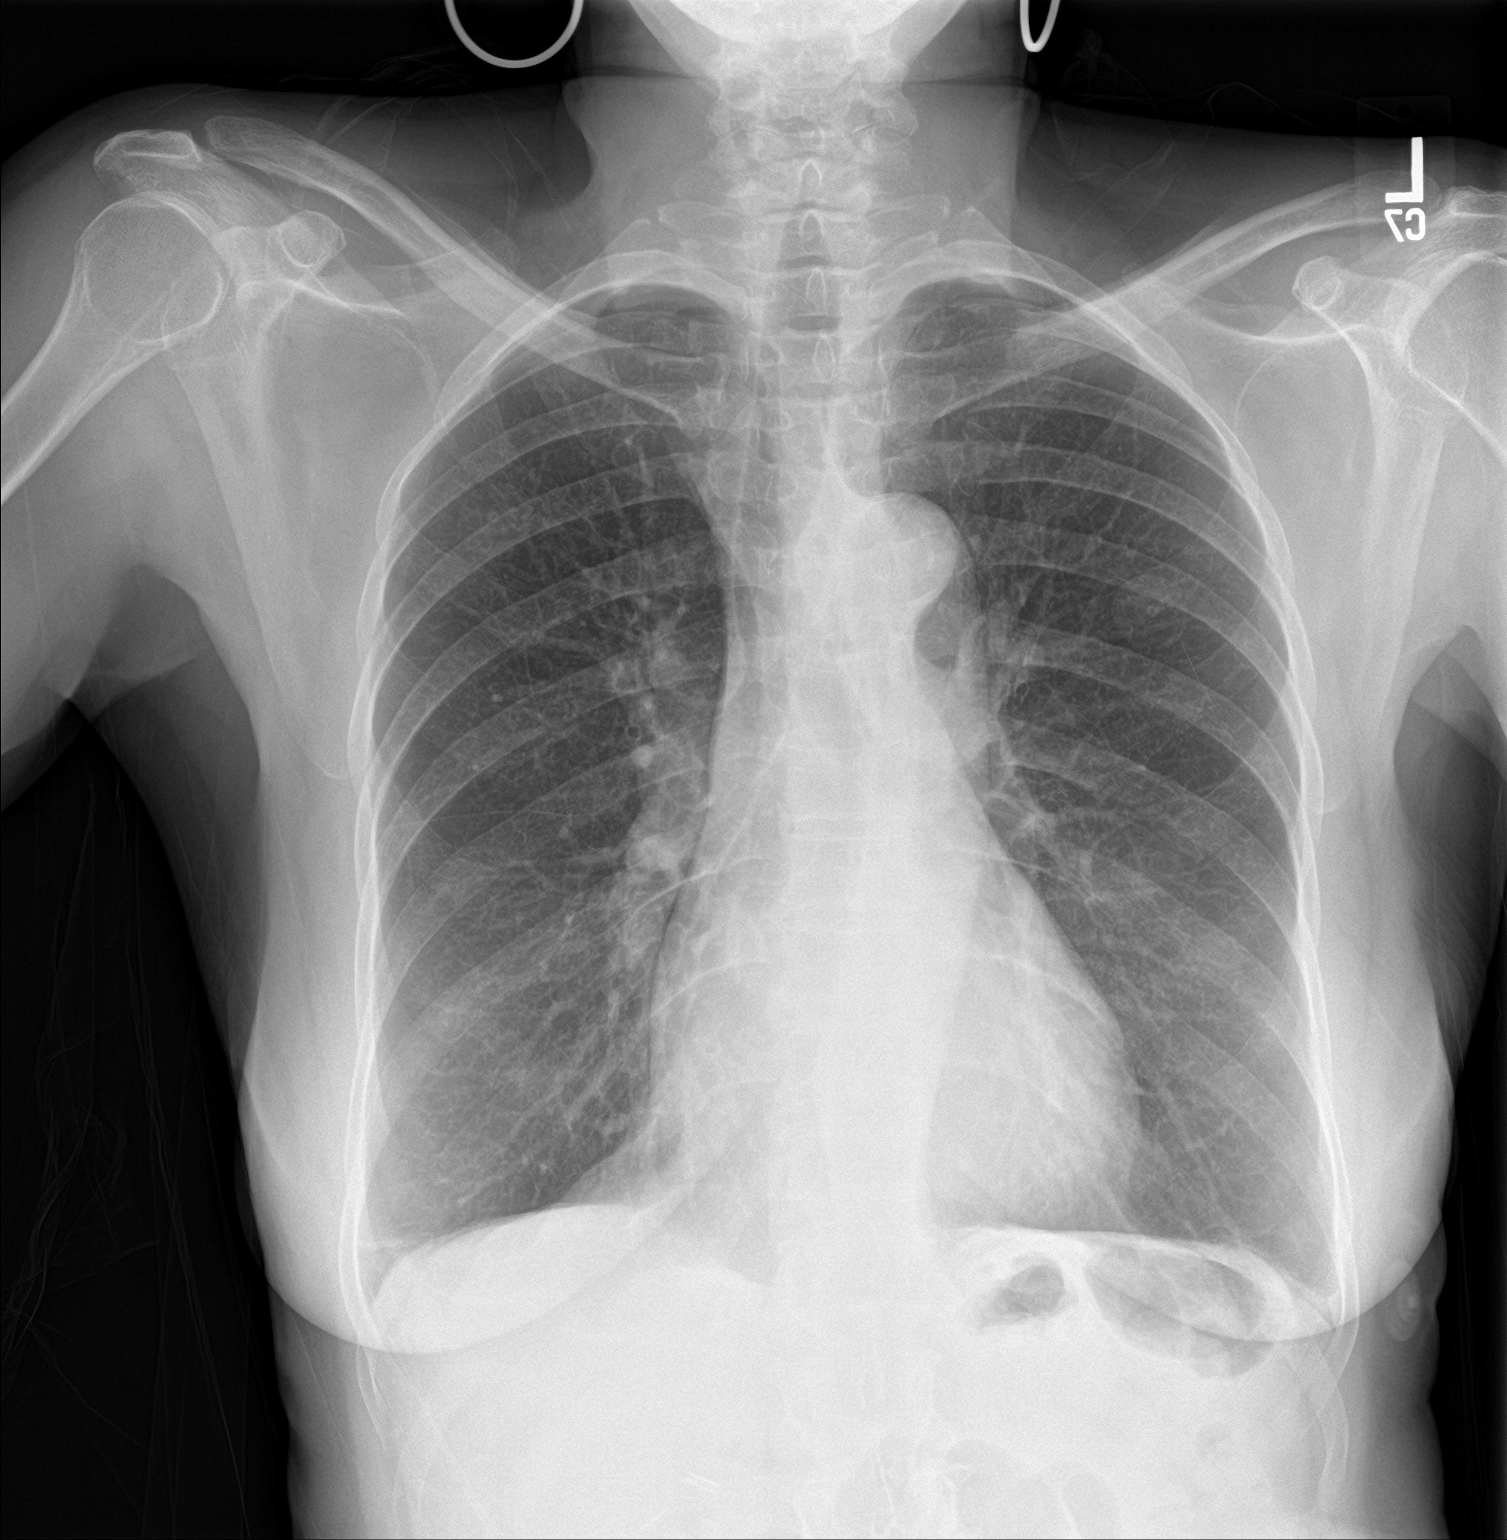

[chest lat]
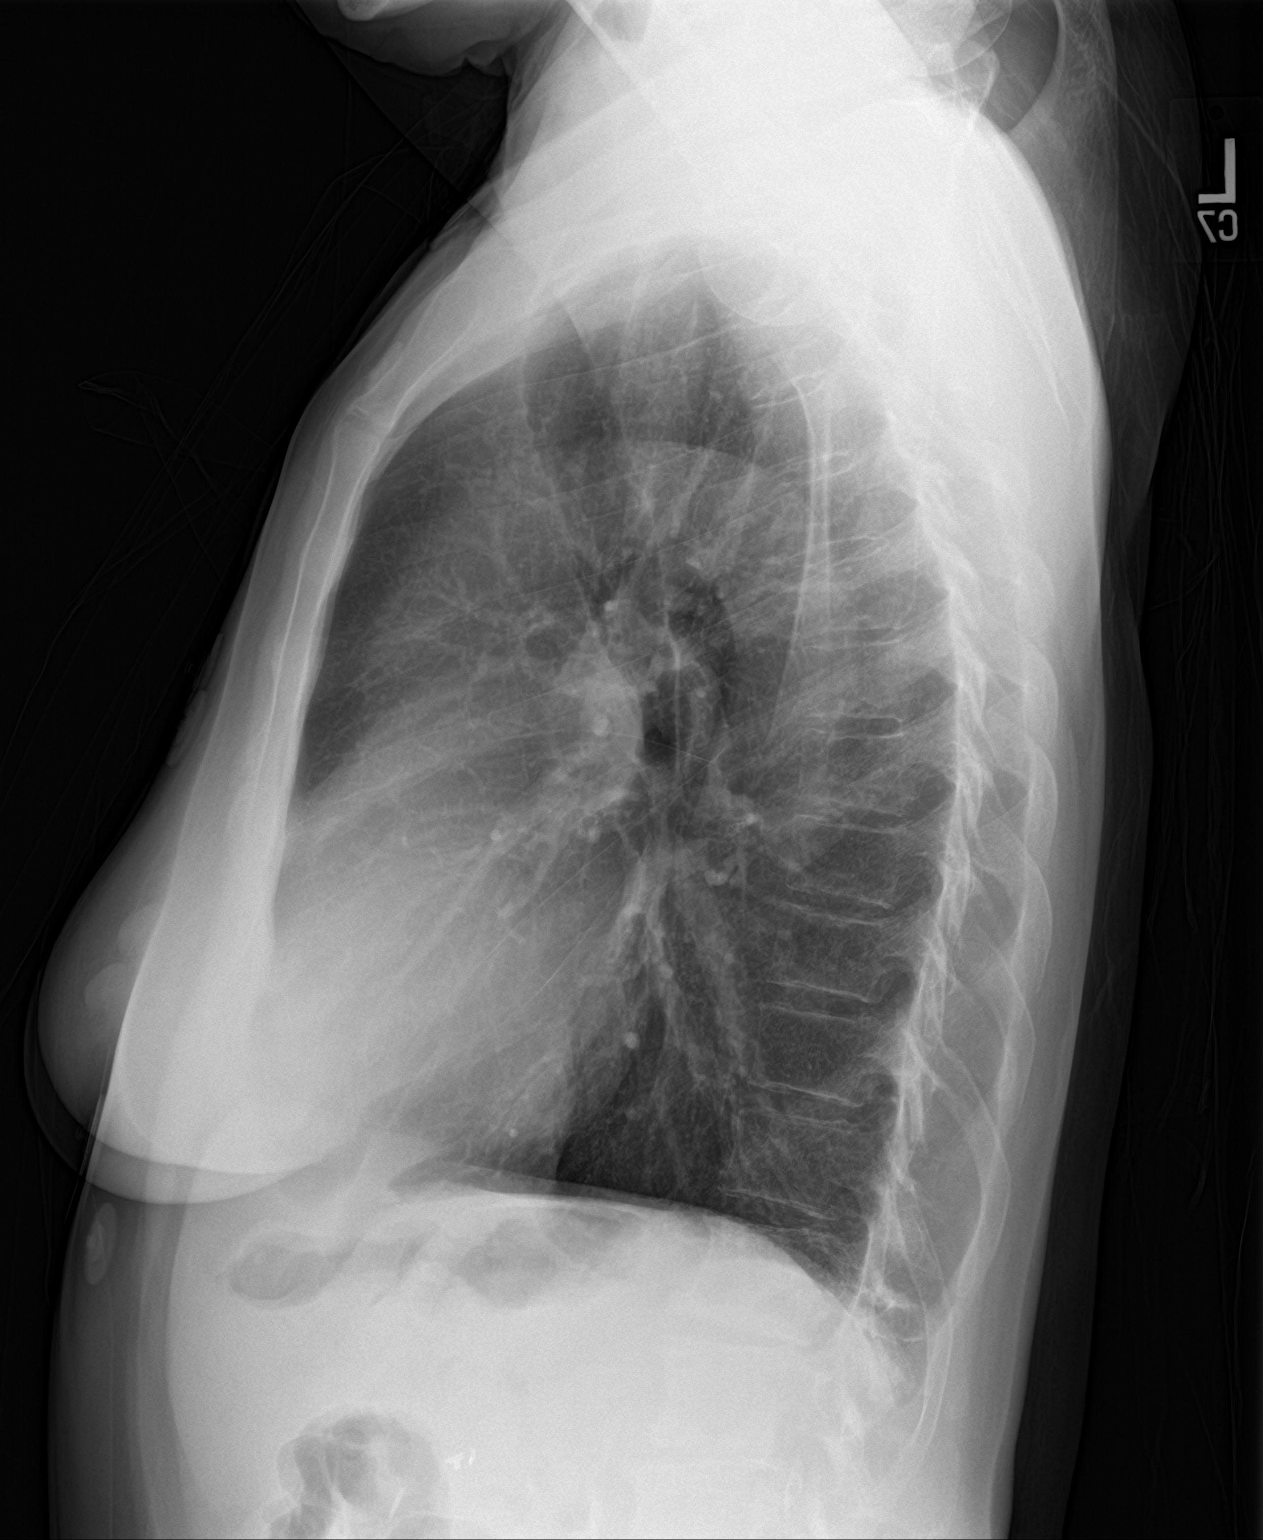

[2 of 2 positions shown; findings below may reference images not displayed]

FINDINGS: The heart size and mediastinal contours are within normal limits.
Both lungs are clear. No pneumothorax or pleural effusion is noted.
The visualized skeletal structures are unremarkable.
IMPRESSION: No active cardiopulmonary disease.

## 2020-09-04 IMAGING — CT CT HEAD W/O CM
4 series · 17 of 47 positions shown, 19 images · non-contrast
Comparison: CT scan of April 22, 2016.

CLINICAL DATA: Headache.

EXAM:
CT HEAD WITHOUT CONTRAST
TECHNIQUE: Contiguous axial images were obtained from the base of the skull
through the vertex without intravenous contrast.

[Series 3: head wo · axial · 0.41mm/px · z∈[-108,+18]mm · 7 of 35 slices shown, 9 images]
[im 5/35  brain]
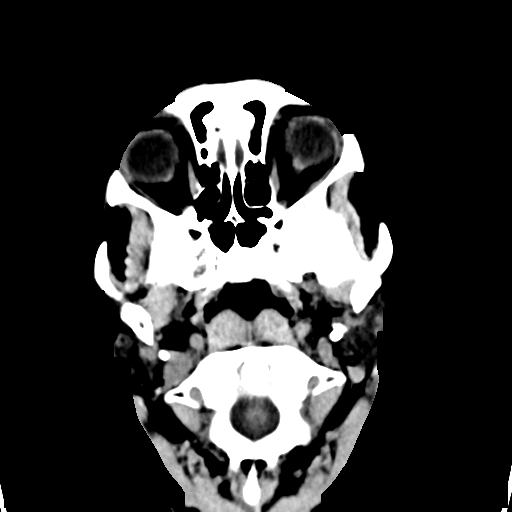
[im 5/35  bone]
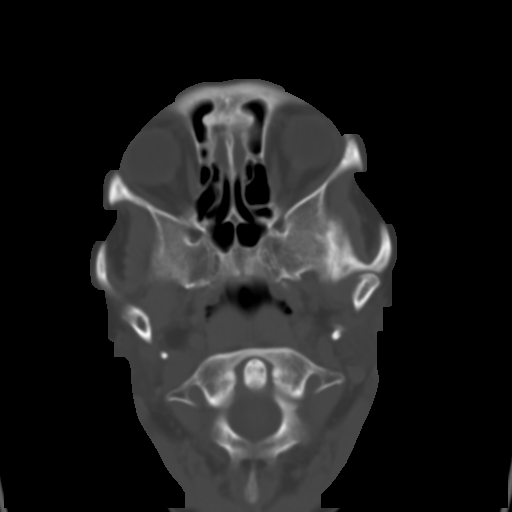
[im 9/35  brain]
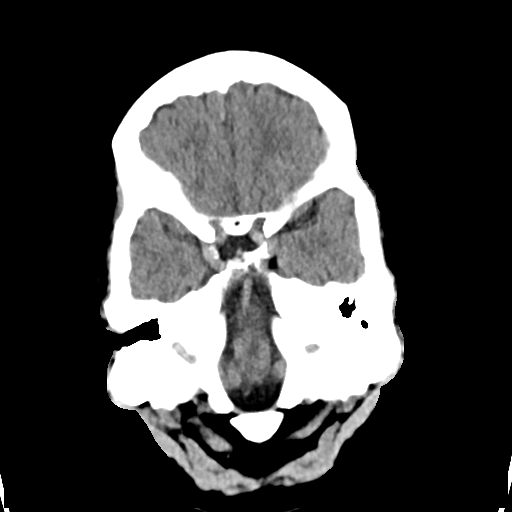
[im 13/35  brain]
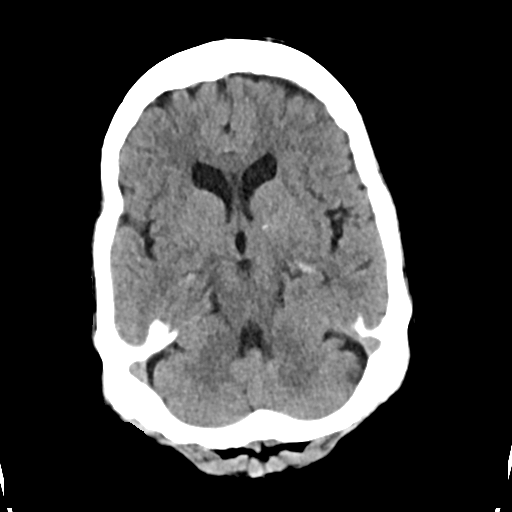
[im 18/35  brain]
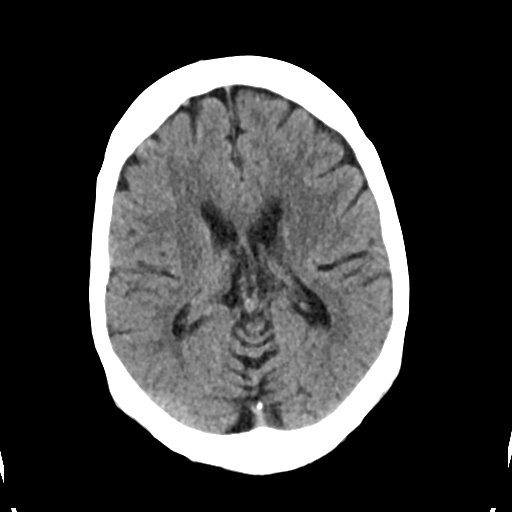
[im 22/35  brain]
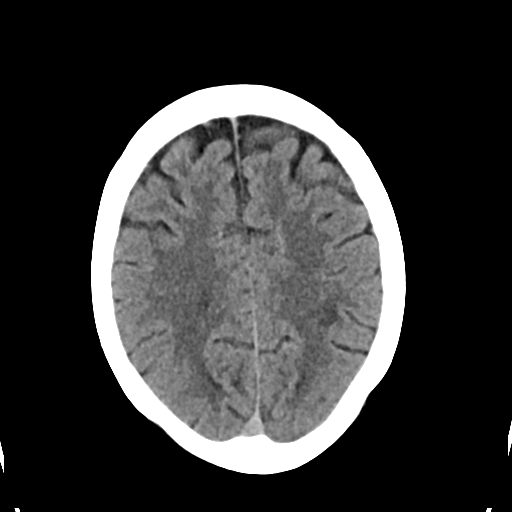
[im 22/35  bone]
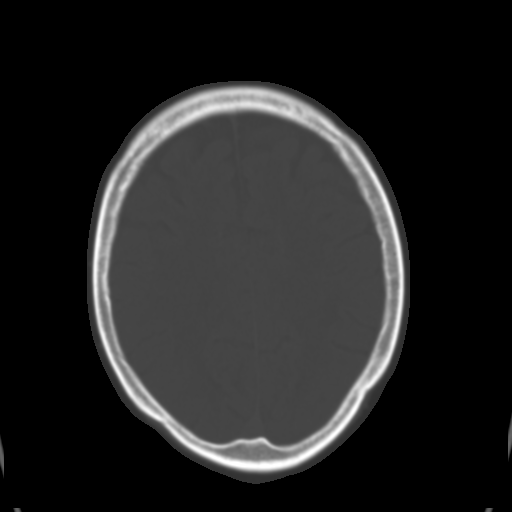
[im 26/35  brain]
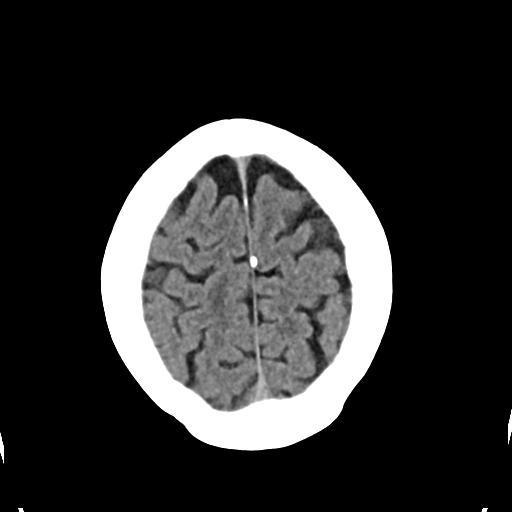
[im 30/35  brain]
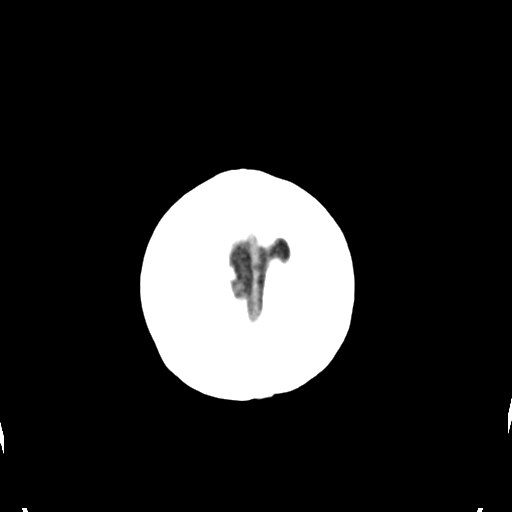

[Series 4: head bone · axial · 0.41mm/px · z∈[-112,-52]mm · 4 of 87 slices shown]
[im 9/87  bone]
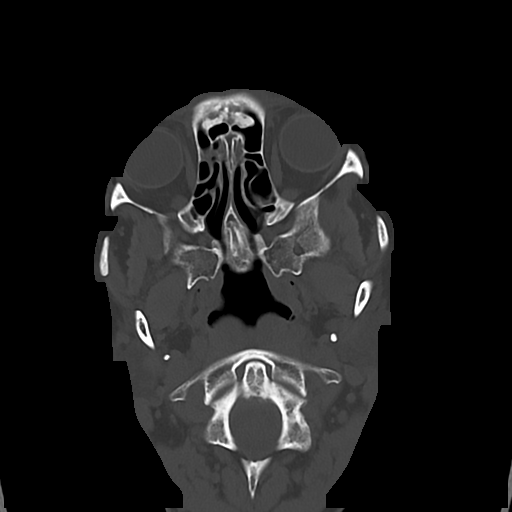
[im 18/87  bone]
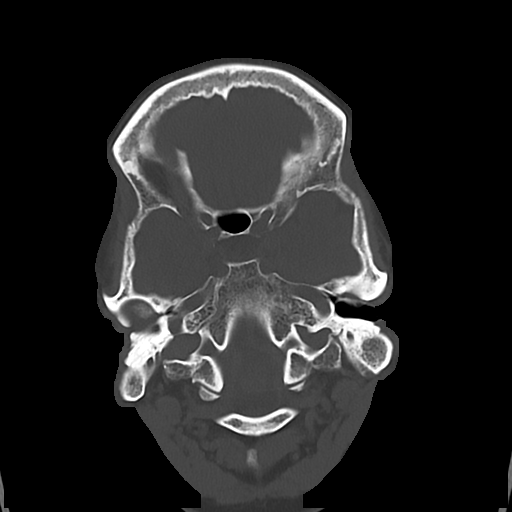
[im 26/87  bone]
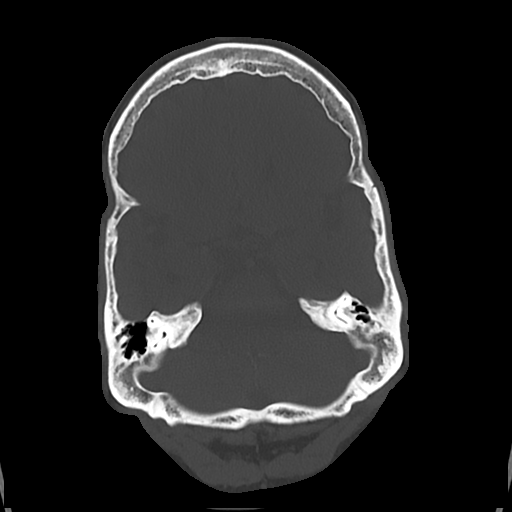
[im 39/87  bone]
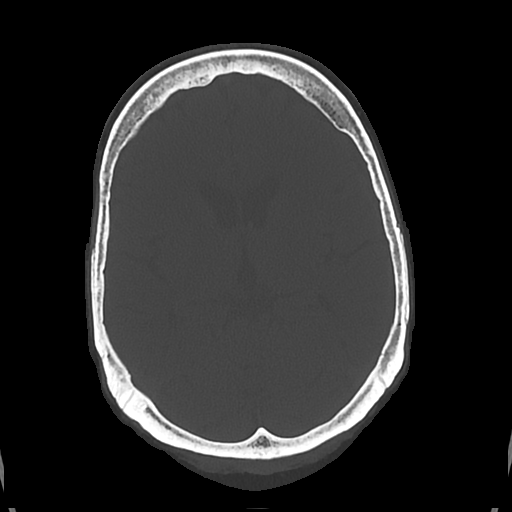

[Series 5: cor soft · coronal · 0.32mm/px · 3 of 66 slices shown]
[im 24/66  brain]
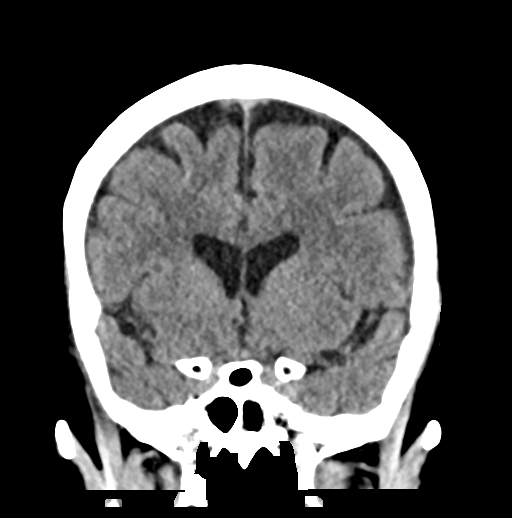
[im 30/66  brain]
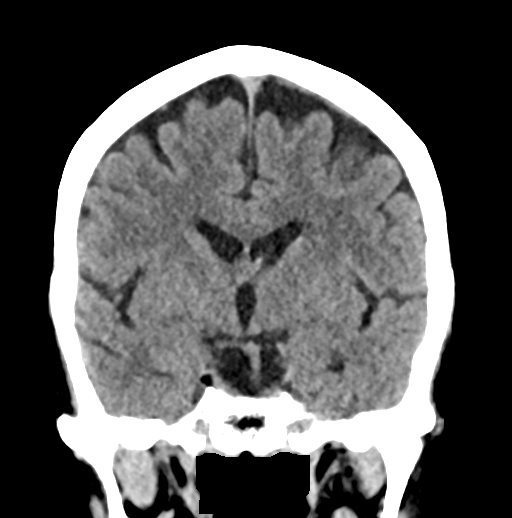
[im 36/66  brain]
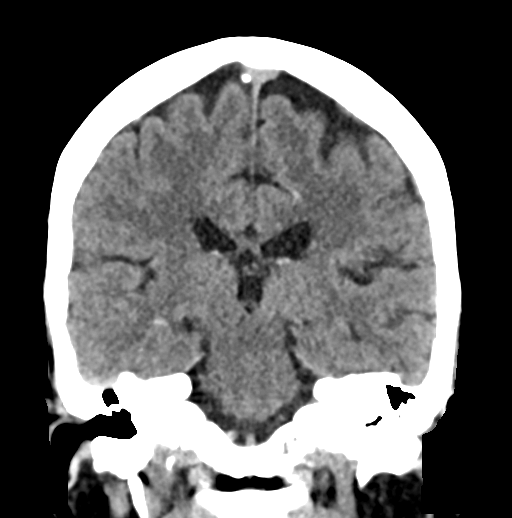

[Series 6: sag soft · sagittal · 0.33mm/px · 3 of 55 slices shown]
[im 19/55  brain]
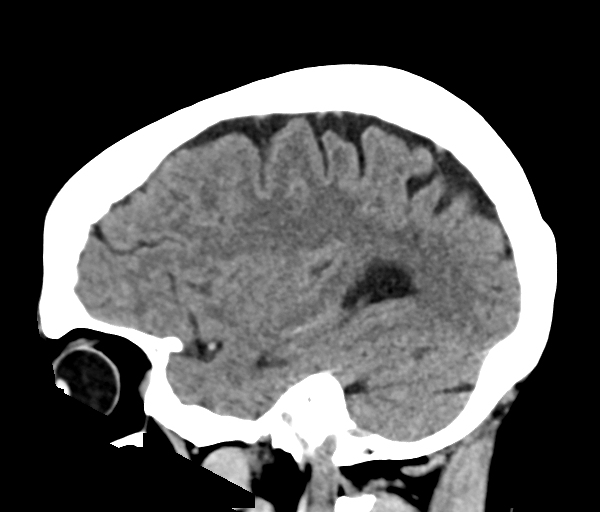
[im 28/55  brain]
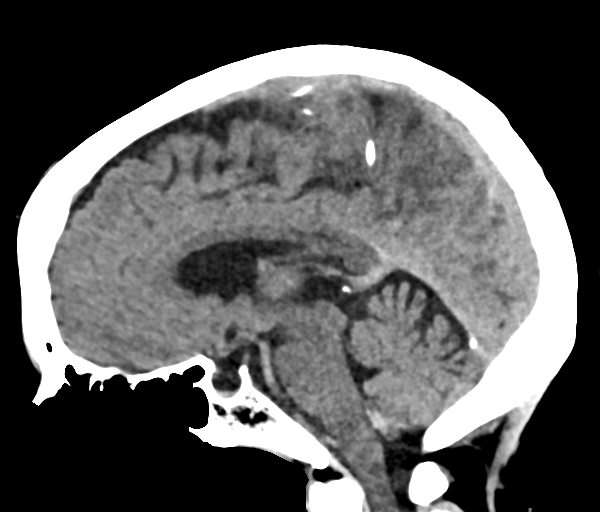
[im 37/55  brain]
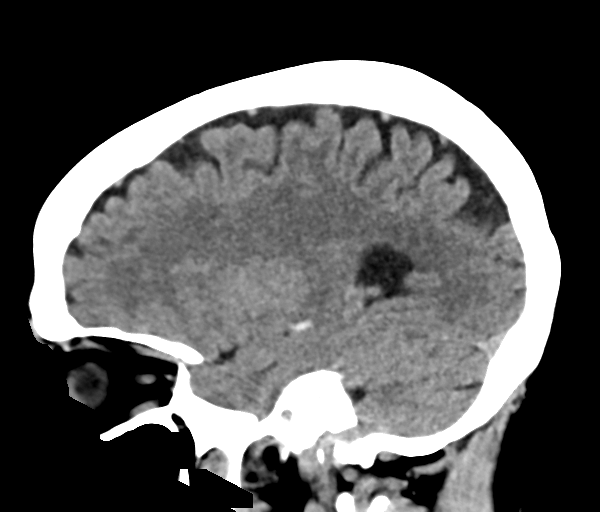

[17 of 47 positions shown; findings below may reference images not displayed]

FINDINGS: Brain: No evidence of acute infarction, hemorrhage, hydrocephalus,
extra-axial collection or mass lesion/mass effect.

Vascular: No hyperdense vessel or unexpected calcification.

Skull: Normal. Negative for fracture or focal lesion.

Sinuses/Orbits: No acute finding.

Other: None.
IMPRESSION: Normal head CT.

## 2020-09-11 ENCOUNTER — Emergency Department (HOSPITAL_COMMUNITY)
Admission: EM | Admit: 2020-09-11 | Discharge: 2020-09-11 | Disposition: A | Discharge status: Home / Self Care | Payer: Medicare Other | Source: Home / Self Care | Attending: Emergency Medicine | Admitting: Emergency Medicine

## 2020-09-11 ENCOUNTER — Emergency Department (HOSPITAL_COMMUNITY): Payer: Medicare Other

## 2020-09-11 ENCOUNTER — Other Ambulatory Visit: Payer: Self-pay

## 2020-09-11 DIAGNOSIS — Z7901 Long term (current) use of anticoagulants: Secondary | ICD-10-CM | POA: Insufficient documentation

## 2020-09-11 DIAGNOSIS — I10 Essential (primary) hypertension: Secondary | ICD-10-CM | POA: Insufficient documentation

## 2020-09-11 DIAGNOSIS — R0689 Other abnormalities of breathing: Secondary | ICD-10-CM | POA: Diagnosis not present

## 2020-09-11 DIAGNOSIS — I4891 Unspecified atrial fibrillation: Secondary | ICD-10-CM | POA: Insufficient documentation

## 2020-09-11 DIAGNOSIS — R6889 Other general symptoms and signs: Secondary | ICD-10-CM | POA: Diagnosis not present

## 2020-09-11 DIAGNOSIS — J302 Other seasonal allergic rhinitis: Secondary | ICD-10-CM | POA: Diagnosis not present

## 2020-09-11 DIAGNOSIS — K219 Gastro-esophageal reflux disease without esophagitis: Secondary | ICD-10-CM | POA: Diagnosis not present

## 2020-09-11 DIAGNOSIS — Z87891 Personal history of nicotine dependence: Secondary | ICD-10-CM | POA: Diagnosis not present

## 2020-09-11 DIAGNOSIS — Z888 Allergy status to other drugs, medicaments and biological substances status: Secondary | ICD-10-CM | POA: Diagnosis not present

## 2020-09-11 DIAGNOSIS — K5909 Other constipation: Secondary | ICD-10-CM | POA: Diagnosis not present

## 2020-09-11 DIAGNOSIS — Z20822 Contact with and (suspected) exposure to covid-19: Secondary | ICD-10-CM | POA: Diagnosis not present

## 2020-09-11 DIAGNOSIS — E785 Hyperlipidemia, unspecified: Secondary | ICD-10-CM | POA: Diagnosis not present

## 2020-09-11 DIAGNOSIS — Z87442 Personal history of urinary calculi: Secondary | ICD-10-CM | POA: Diagnosis not present

## 2020-09-11 DIAGNOSIS — J4521 Mild intermittent asthma with (acute) exacerbation: Secondary | ICD-10-CM | POA: Insufficient documentation

## 2020-09-11 DIAGNOSIS — Z79899 Other long term (current) drug therapy: Secondary | ICD-10-CM | POA: Insufficient documentation

## 2020-09-11 DIAGNOSIS — Z7951 Long term (current) use of inhaled steroids: Secondary | ICD-10-CM | POA: Insufficient documentation

## 2020-09-11 DIAGNOSIS — R0602 Shortness of breath: Secondary | ICD-10-CM | POA: Diagnosis not present

## 2020-09-11 DIAGNOSIS — L309 Dermatitis, unspecified: Secondary | ICD-10-CM | POA: Diagnosis not present

## 2020-09-11 DIAGNOSIS — R069 Unspecified abnormalities of breathing: Secondary | ICD-10-CM | POA: Diagnosis not present

## 2020-09-11 DIAGNOSIS — Z8249 Family history of ischemic heart disease and other diseases of the circulatory system: Secondary | ICD-10-CM | POA: Diagnosis not present

## 2020-09-11 DIAGNOSIS — I499 Cardiac arrhythmia, unspecified: Secondary | ICD-10-CM | POA: Diagnosis not present

## 2020-09-11 DIAGNOSIS — J45909 Unspecified asthma, uncomplicated: Secondary | ICD-10-CM | POA: Diagnosis not present

## 2020-09-11 DIAGNOSIS — Z885 Allergy status to narcotic agent status: Secondary | ICD-10-CM | POA: Diagnosis not present

## 2020-09-11 DIAGNOSIS — M810 Age-related osteoporosis without current pathological fracture: Secondary | ICD-10-CM | POA: Diagnosis not present

## 2020-09-11 DIAGNOSIS — Z743 Need for continuous supervision: Secondary | ICD-10-CM | POA: Diagnosis not present

## 2020-09-11 DIAGNOSIS — I480 Paroxysmal atrial fibrillation: Secondary | ICD-10-CM | POA: Diagnosis not present

## 2020-09-11 DIAGNOSIS — Z7952 Long term (current) use of systemic steroids: Secondary | ICD-10-CM | POA: Diagnosis not present

## 2020-09-11 LAB — CBC
HCT: 43 % (ref 36.0–46.0)
Hemoglobin: 13.5 g/dL (ref 12.0–15.0)
MCH: 26.3 pg (ref 26.0–34.0)
MCHC: 31.4 g/dL (ref 30.0–36.0)
MCV: 83.8 fL (ref 80.0–100.0)
Platelets: 305 10*3/uL (ref 150–400)
RBC: 5.13 MIL/uL — ABNORMAL HIGH (ref 3.87–5.11)
RDW: 15.7 % — ABNORMAL HIGH (ref 11.5–15.5)
WBC: 5.6 10*3/uL (ref 4.0–10.5)
nRBC: 0 % (ref 0.0–0.2)

## 2020-09-11 LAB — BASIC METABOLIC PANEL
Anion gap: 12 (ref 5–15)
BUN: 12 mg/dL (ref 8–23)
CO2: 22 mmol/L (ref 22–32)
Calcium: 9.4 mg/dL (ref 8.9–10.3)
Chloride: 107 mmol/L (ref 98–111)
Creatinine, Ser: 0.84 mg/dL (ref 0.44–1.00)
GFR, Estimated: >60 mL/min (ref >60)
Glucose, Bld: 141 mg/dL — ABNORMAL HIGH (ref 70–99)
Potassium: 3.1 mmol/L — ABNORMAL LOW (ref 3.5–5.1)
Sodium: 141 mmol/L (ref 135–145)

## 2020-09-11 MED ORDER — LEVALBUTEROL TARTRATE 45 MCG/ACT IN AERO: INHALATION_SPRAY | RESPIRATORY_TRACT | 1 refills | Status: DC

## 2020-09-11 MED ORDER — LEVALBUTEROL HCL 0.63 MG/3ML IN NEBU: 0.6300 mg | INHALATION_SOLUTION | Freq: Once | RESPIRATORY_TRACT | 1 refills | Status: DC

## 2020-09-11 MED ORDER — PREDNISONE 50 MG PO TABS: 50.0000 mg | ORAL_TABLET | Freq: Every day | ORAL | 0 refills | Status: DC

## 2020-09-11 NOTE — ED Notes (Signed)
Patient discharge instructions reviewed with the patient. The patient verbalized understanding of instructions. Patient discharged. 

## 2020-09-11 NOTE — Discharge Instructions (Addendum)
Continue your current medications.  Take the steroids and levalbuterol (xopenex) as prescribed.  You can take the Xopenex either in the inhaler form or the solution for your nebulizer machine follow-up with your doctor next week to be rechecked if the symptoms have not resolved.  Return as needed for worsening symptoms

## 2020-09-11 NOTE — ED Provider Notes (Signed)
[facility] EMERGENCY DEPARTMENT Provider Note   CSN: [id number] Arrival date & time: 09/11/20  0554     History Chief Complaint  Patient presents with  . Shortness of Breath    [name] is a 70 y.o. female.  HPI   Patient presents emergency room for evaluation of cold type symptoms.  Had some sinus congestion.  Coughing.  She woke up he couldn't breathe.  Short of breath.  Patient does have a history of asthma but does not regularly have to use breathing treatments.  Patient did not have anything at home.  She called EMS and when they found her they noted that she was wheezing.  She was given Solu-Medrol as well as albuterol and Atrovent.  Patient was also given magnesium.  She is feeling much better now.  Patient denies any fevers or chills.  She has been vaccinated for COVID.  Past Medical History:  Diagnosis Date  . Asthma in adult, mild intermittent, uncomplicated   . Chronic anticoagulation   . Chronic constipation   . Eczema   . GERD (gastroesophageal reflux disease)   . History of kidney stones   . Hypertension   . Personal history of atrial fibrillation, s/p ablation, now in NSR but remains on anticoaulation 11/26/2015   Followed in cardiology clinic     Patient Active Problem List   Diagnosis Date Noted  . Chronic seasonal allergic rhinitis 07/25/2020  . Asthma in adult, mild intermittent, uncomplicated   . Chronic bilateral lower abdominal pain 07/24/2020  . 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 ([place]) 07/23/2020  . Chronic lower back pain 02/14/2020  . Hx of vaginal bleeding, resolved, ultrasound completed 10/11/2018  . History of radiofrequency ablation (RFA) procedure for cardiac arrhythmia   . Primary osteoarthritis involving multiple joints 06/14/2018  . Hyperpigmented skin lesion 09/09/2017  . Osteoporosis without current pathological fracture 04/15/2017  . Constipation, chronic 10/19/2016  . Bilateral hearing loss due to  cerumen impaction 09/10/2016  . Hyperlipidemia 03/18/2016  . HTN (hypertension), benign   . GERD (gastroesophageal reflux disease)   . Personal history of atrial fibrillation, now s/p ablation in NSR 11/26/2015  . Marijuana abuse 06/11/2015    Past Surgical History:  Procedure Laterality Date  . ATRIAL FIBRILLATION ABLATION N/A 09/30/2017   Procedure: ATRIAL FIBRILLATION ABLATION;  Surgeon: Thompson Grayer, MD;  Location: [facility];  Service: Cardiovascular;  Laterality: N/A;  . CHOLECYSTECTOMY N/A 12/08/2018   Procedure: LAPAROSCOPIC CHOLECYSTECTOMY WITH INTRAOPERATIVE CHOLANGIOGRAM;  Surgeon: Jovita Kussmaul, MD;  Location: [facility];  Service: General;  Laterality: N/A;  . ERCP N/A 12/09/2018   Procedure: ENDOSCOPIC RETROGRADE CHOLANGIOPANCREATOGRAPHY (ERCP);  Surgeon: Carol Ada, MD;  Location: [facility];  Service: Endoscopy;  Laterality: N/A;  . SPHINCTEROTOMY  12/09/2018   Procedure: SPHINCTEROTOMY;  Surgeon: Carol Ada, MD;  Location: [facility];  Service: Endoscopy;;  . TONSILLECTOMY    . TUBAL LIGATION       OB History   No obstetric history on file.     Family History  Problem Relation Age of Onset  . Heart attack Mother        [name] age  . Breast cancer Sister        Late 19s; now s/p mastectomy   . Lung cancer Brother        x 2  . Cancer Sister   . Cancer Sister   . Cancer Sister   . Colon cancer Neg Hx   . Colon polyps  Neg Hx   . Esophageal cancer Neg Hx   . Rectal cancer Neg Hx   . Stomach cancer Neg Hx     Social History   Tobacco Use  . Smoking status: Former Smoker    Quit date: 10/24/2015    Years since quitting: 4.8  . Smokeless tobacco: Never Used  Vaping Use  . Vaping Use: Never used  Substance Use Topics  . Alcohol use: Not Currently  . Drug use: Not Currently    Types: Marijuana    Comment: Smoked marijuana in the past.    Home Medications Prior to Admission medications   Medication Sig Start Date End Date Taking?  Authorizing Provider  levalbuterol (XOPENEX) 0.63 MG/3ML nebulizer solution Take 3 mLs (0.63 mg total) by nebulization once for 1 dose. 09/11/20 09/11/20 Yes Dorie Rank, MD  predniSONE (DELTASONE) 50 MG tablet Take 1 tablet (50 mg total) by mouth daily. 09/11/20  Yes Dorie Rank, MD  acetaminophen (TYLENOL) 500 MG tablet Take 2 tablets (1,000 mg total) by mouth every 8 (eight) hours as needed. Patient taking differently: Take 1,000 mg by mouth as needed.  05/20/19   Charlesetta Shanks, MD  Azelastine-Fluticasone [phone] MCG/ACT SUSP One spray in each nostril twice a day, every day 07/24/20   Angelica Pou, MD  carbamide peroxide (DEBROX) 6.5 % OTIC solution Place 5 drops into each ear to soften ear wax the night before and the morning of your next clinic appointment. 07/25/20   Angelica Pou, MD  Cholecalciferol (VITAMIN D) 50 MCG (2000 UT) tablet Take 1 tablet (2,000 Units total) by mouth daily. For bone health. 07/25/20   Angelica Pou, MD  diltiazem (CARDIZEM) 60 MG tablet Take 1 tablet (60 mg total) by mouth every 6 (six) hours as needed (Rapid AFIB over 100). 05/09/20   Sherran Needs, NP  diltiazem (CARTIA XT) 120 MG 24 hr capsule Take 1 capsule (120 mg total) by mouth 2 (two) times daily. 01/22/20   Sherran Needs, NP  fluticasone (FLOVENT HFA) 44 MCG/ACT inhaler Inhale 1 puff into the lungs daily. 04/02/20   Angelica Pou, MD  HYDROcodone-acetaminophen (NORCO/VICODIN) 5-325 MG tablet Take 2 tablets by mouth every 4 (four) hours as needed. 08/11/20   Blanchie Dessert, MD  lactulose (CEPHULAC) 10 g packet Take 1 packet (10 g total) by mouth 2 (two) times daily. Start with taking 1 packet 2 times a day and if that does not produce soft bowel movements daily you can increase it to 3 times a day 08/11/20   Blanchie Dessert, MD  levalbuterol [facility] HFA) 45 MCG/ACT inhaler INHALE 1 PUFF INTO THE LUNGS EVERY 6 HOURS AS NEEDED FOR SHORTNESS OF BREATH 09/11/20   Dorie Rank, MD   linaclotide [facility]) 72 MCG capsule TAKE 1 CAPSULE(145 MCG) BY MOUTH DAILY BEFORE BREAKFAST 07/25/20   Angelica Pou, MD  losartan (COZAAR) 50 MG tablet Take 1.5 tablets (75 mg total) by mouth daily. 07/25/20 12/07/20  Angelica Pou, MD  montelukast (SINGULAIR) 10 MG tablet Take 10 mg by mouth at bedtime.    [provider]  pantoprazole (PROTONIX) 40 MG tablet Take 1 tablet (40 mg total) by mouth daily. 07/24/20   Angelica Pou, MD  rivaroxaban (XARELTO) 20 MG TABS tablet Take 1 tablet (20 mg total) by mouth daily with supper. 11/07/19   Sherran Needs, NP  tiotropium (SPIRIVA) 18 MCG inhalation capsule Place 1 capsule (18 mcg total) into inhaler and inhale daily. 04/16/20  Angelica Pou, MD    Allergies    Albuterol, Percocet [oxycodone-acetaminophen], Celecoxib, and Protonix [pantoprazole sodium]  Review of Systems   Review of Systems  Gastrointestinal:       Recent issues with constipation, started new medication, now has noticed more frequent stools  All other systems reviewed and are negative.   Physical Exam Updated Vital Signs BP 133/87   Pulse 93   Temp 97.9 F (36.6 C) (Oral)   Resp (!) 22   Ht 1.702 m (5\' 7" )   Wt 67.6 kg   SpO2 96%   BMI 23.34 kg/m   Physical Exam Vitals and nursing note reviewed.  Constitutional:      General: She is not in acute distress.    Appearance: She is well-developed and well-nourished.  HENT:     Head: Normocephalic and atraumatic.     Right Ear: External ear normal.     Left Ear: External ear normal.  Eyes:     General: No scleral icterus.       Right eye: No discharge.        Left eye: No discharge.     Conjunctiva/sclera: Conjunctivae normal.  Neck:     Trachea: No tracheal deviation.  Cardiovascular:     Rate and Rhythm: Normal rate and regular rhythm.     Pulses: Intact distal pulses.  Pulmonary:     Effort: Pulmonary effort is normal. No respiratory distress.     Breath sounds:  Normal breath sounds. No stridor. No rales.     Comments: Occasional isolated wheeze, breathing easily, able to speak in full sentences without difficulty Abdominal:     General: Bowel sounds are normal. There is no distension.     Palpations: Abdomen is soft.     Tenderness: There is no abdominal tenderness. There is no guarding or rebound.  Musculoskeletal:        General: No tenderness or edema.     Cervical back: Neck supple.  Skin:    General: Skin is warm and dry.     Findings: No rash.  Neurological:     Mental Status: She is alert.     Cranial Nerves: No cranial nerve deficit (no facial droop, extraocular movements intact, no slurred speech).     Sensory: No sensory deficit.     Motor: No abnormal muscle tone or seizure activity.     Coordination: Coordination normal.     Deep Tendon Reflexes: Strength normal.  Psychiatric:        Mood and Affect: Mood and affect normal.     ED Results / Procedures / Treatments   Labs (all labs ordered are listed, but only abnormal results are displayed) Labs Reviewed  CBC - Abnormal; Notable for the following components:      Result Value   RBC 5.13 (*)    RDW 15.7 (*)    All other components within normal limits  BASIC METABOLIC PANEL - Abnormal; Notable for the following components:   Potassium 3.1 (*)    Glucose, Bld 141 (*)    All other components within normal limits    EKG None  Radiology DG Chest Portable 1 View  Result Date: 09/11/2020 CLINICAL DATA:  Shortness of breath EXAM: PORTABLE CHEST 1 VIEW COMPARISON:  11/03/2019 FINDINGS: Large lung volumes with diaphragm flattening. There is chart history of asthma. There is no edema, consolidation, effusion, or pneumothorax. Normal heart size and mediastinal contours. Artifact from EKG leads. IMPRESSION: No acute finding. Electronically  Signed   By: Monte Fantasia M.D.   On: 09/11/2020 07:39    Procedures Procedures (including critical care time)  Medications Ordered in  ED Medications - No data to display  ED Course  I have reviewed the triage vital signs and the nursing notes.  Pertinent labs & imaging results that were available during my care of the patient were reviewed by me and considered in my medical decision making (see chart for details).  Clinical Course as of 09/11/20 0928  Thu Sep 11, 2020  0843 CBC is normal.  Metabolic panel shows low potassium at 3.1.  Likely related to her recent beta agonist inhalers. [JK]  [id number] Chest x-ray does not show any acute abnormality [JK]    Clinical Course User Index [JK] Dorie Rank, MD   MDM Rules/Calculators/A&P                          Patient presented to the ED with complaints of shortness of breath.  Patient has history of asthma and was treated with breathing treatments by EMS.  Symptoms improved significantly prior to arrival.  In the ED the patient had mild intermittent wheezing.  She was given additional inhalers.  X-ray is not showing any signs of pneumonia or CHF.  Patient's vital signs are normal.  She is breathing easily and speaking in full sentences.  I do not feel that she requires admission.  Plan on discharge home with prescription for steroids and Xopenex.  Patient requests an inhaler and the nebulizer solution. Final Clinical Impression(s) / ED Diagnoses she is Final diagnoses:  Exacerbation of intermittent asthma, unspecified asthma severity    Rx / DC Orders ED Discharge Orders         Ordered    levalbuterol [facility] HFA) 45 MCG/ACT inhaler        09/11/20 0927    levalbuterol (XOPENEX) 0.63 MG/3ML nebulizer solution   Once        09/11/20 0927    predniSONE (DELTASONE) 50 MG tablet  Daily        09/11/20 0927           Dorie Rank, MD 09/11/20 0930

## 2020-09-11 NOTE — ED Triage Notes (Signed)
Pt arrived via ems from home due to shob. Pt states she was woken up out of her sleep feeling like she could not catch her breath. She states she has been feeling sob x 3 days. Upon ems arrival pt had audible wheezing. Pt received 10 albuterol, 0.5 Atrovent, 125 solumedrol, and 2g mag

## 2020-09-12 ENCOUNTER — Other Ambulatory Visit: Payer: Self-pay

## 2020-09-12 ENCOUNTER — Emergency Department (HOSPITAL_COMMUNITY): Payer: Medicare Other

## 2020-09-12 ENCOUNTER — Inpatient Hospital Stay (HOSPITAL_COMMUNITY)
Admission: EM | Admit: 2020-09-12 | Discharge: 2020-09-14 | DRG: 203 | Disposition: A | Discharge status: Home / Self Care | Payer: Medicare Other | Attending: Internal Medicine | Admitting: Internal Medicine

## 2020-09-12 DIAGNOSIS — Z87442 Personal history of urinary calculi: Secondary | ICD-10-CM

## 2020-09-12 DIAGNOSIS — Z888 Allergy status to other drugs, medicaments and biological substances status: Secondary | ICD-10-CM

## 2020-09-12 DIAGNOSIS — K5909 Other constipation: Secondary | ICD-10-CM | POA: Diagnosis present

## 2020-09-12 DIAGNOSIS — Z7901 Long term (current) use of anticoagulants: Secondary | ICD-10-CM

## 2020-09-12 DIAGNOSIS — Z7952 Long term (current) use of systemic steroids: Secondary | ICD-10-CM

## 2020-09-12 DIAGNOSIS — L309 Dermatitis, unspecified: Secondary | ICD-10-CM | POA: Diagnosis present

## 2020-09-12 DIAGNOSIS — I480 Paroxysmal atrial fibrillation: Secondary | ICD-10-CM | POA: Diagnosis present

## 2020-09-12 DIAGNOSIS — F410 Panic disorder [episodic paroxysmal anxiety] without agoraphobia: Secondary | ICD-10-CM | POA: Diagnosis present

## 2020-09-12 DIAGNOSIS — Z20822 Contact with and (suspected) exposure to covid-19: Secondary | ICD-10-CM | POA: Diagnosis present

## 2020-09-12 DIAGNOSIS — J4521 Mild intermittent asthma with (acute) exacerbation: Principal | ICD-10-CM | POA: Diagnosis present

## 2020-09-12 DIAGNOSIS — M810 Age-related osteoporosis without current pathological fracture: Secondary | ICD-10-CM | POA: Diagnosis present

## 2020-09-12 DIAGNOSIS — K219 Gastro-esophageal reflux disease without esophagitis: Secondary | ICD-10-CM | POA: Diagnosis present

## 2020-09-12 DIAGNOSIS — Z87891 Personal history of nicotine dependence: Secondary | ICD-10-CM

## 2020-09-12 DIAGNOSIS — J45901 Unspecified asthma with (acute) exacerbation: Secondary | ICD-10-CM | POA: Diagnosis present

## 2020-09-12 DIAGNOSIS — R0602 Shortness of breath: Secondary | ICD-10-CM

## 2020-09-12 DIAGNOSIS — I10 Essential (primary) hypertension: Secondary | ICD-10-CM | POA: Diagnosis present

## 2020-09-12 DIAGNOSIS — Z8679 Personal history of other diseases of the circulatory system: Secondary | ICD-10-CM

## 2020-09-12 DIAGNOSIS — J4520 Mild intermittent asthma, uncomplicated: Secondary | ICD-10-CM | POA: Diagnosis present

## 2020-09-12 DIAGNOSIS — E785 Hyperlipidemia, unspecified: Secondary | ICD-10-CM | POA: Diagnosis present

## 2020-09-12 DIAGNOSIS — Z79899 Other long term (current) drug therapy: Secondary | ICD-10-CM

## 2020-09-12 DIAGNOSIS — Z885 Allergy status to narcotic agent status: Secondary | ICD-10-CM

## 2020-09-12 DIAGNOSIS — J302 Other seasonal allergic rhinitis: Secondary | ICD-10-CM | POA: Diagnosis present

## 2020-09-12 DIAGNOSIS — Z8249 Family history of ischemic heart disease and other diseases of the circulatory system: Secondary | ICD-10-CM

## 2020-09-12 LAB — CBC WITH DIFFERENTIAL/PLATELET
Abs Immature Granulocytes: 0.06 10*3/uL (ref 0.00–0.07)
Basophils Absolute: 0 10*3/uL (ref 0.0–0.1)
Basophils Relative: 0 %
Eosinophils Absolute: 0 10*3/uL (ref 0.0–0.5)
Eosinophils Relative: 0 %
HCT: 39.1 % (ref 36.0–46.0)
Hemoglobin: 13 g/dL (ref 12.0–15.0)
Immature Granulocytes: 0 %
Lymphocytes Relative: 11 %
Lymphs Abs: 1.5 10*3/uL (ref 0.7–4.0)
MCH: 27 pg (ref 26.0–34.0)
MCHC: 33.2 g/dL (ref 30.0–36.0)
MCV: 81.3 fL (ref 80.0–100.0)
Monocytes Absolute: 0.6 10*3/uL (ref 0.1–1.0)
Monocytes Relative: 4 %
Neutro Abs: 11.7 10*3/uL — ABNORMAL HIGH (ref 1.7–7.7)
Neutrophils Relative %: 85 %
Platelets: 302 10*3/uL (ref 150–400)
RBC: 4.81 MIL/uL (ref 3.87–5.11)
RDW: 15.7 % — ABNORMAL HIGH (ref 11.5–15.5)
WBC: 13.8 10*3/uL — ABNORMAL HIGH (ref 4.0–10.5)
nRBC: 0 % (ref 0.0–0.2)

## 2020-09-12 LAB — BASIC METABOLIC PANEL
Anion gap: 12 (ref 5–15)
BUN: 16 mg/dL (ref 8–23)
CO2: 22 mmol/L (ref 22–32)
Calcium: 9.8 mg/dL (ref 8.9–10.3)
Chloride: 105 mmol/L (ref 98–111)
Creatinine, Ser: 0.92 mg/dL (ref 0.44–1.00)
GFR, Estimated: >60 mL/min (ref >60)
Glucose, Bld: 123 mg/dL — ABNORMAL HIGH (ref 70–99)
Potassium: 3.5 mmol/L (ref 3.5–5.1)
Sodium: 139 mmol/L (ref 135–145)

## 2020-09-12 LAB — SARS CORONAVIRUS 2 (TAT 6-24 HRS): SARS Coronavirus 2: NEGATIVE

## 2020-09-12 MED ORDER — BUDESONIDE 0.25 MG/2ML IN SUSP
0.2500 mg | Freq: Two times a day (BID) | RESPIRATORY_TRACT | Status: DC
  Administered 2020-09-12 – 2020-09-14 (×4): 0.25 mg via RESPIRATORY_TRACT
  Filled 2020-09-12 (×4): qty 2

## 2020-09-12 MED ORDER — IPRATROPIUM BROMIDE 0.02 % IN SOLN
0.5000 mg | Freq: Four times a day (QID) | RESPIRATORY_TRACT | Status: DC
  Administered 2020-09-12 (×2): 0.5 mg via RESPIRATORY_TRACT
  Filled 2020-09-12 (×2): qty 2.5

## 2020-09-12 MED ORDER — AEROCHAMBER PLUS FLO-VU LARGE MISC
1.0000 | Freq: Once | Status: AC
  Administered 2020-09-12: 1

## 2020-09-12 MED ORDER — DILTIAZEM HCL ER COATED BEADS 120 MG PO CP24
120.0000 mg | ORAL_CAPSULE | Freq: Two times a day (BID) | ORAL | Status: DC
Start: 2020-09-12 — End: 2020-09-15
  Administered 2020-09-12 – 2020-09-14 (×5): 120 mg via ORAL
  Filled 2020-09-12 (×8): qty 1

## 2020-09-12 MED ORDER — LEVALBUTEROL HCL 1.25 MG/0.5ML IN NEBU
1.2500 mg | INHALATION_SOLUTION | Freq: Four times a day (QID) | RESPIRATORY_TRACT | Status: DC
  Administered 2020-09-12 – 2020-09-13 (×4): 1.25 mg via RESPIRATORY_TRACT
  Filled 2020-09-12 (×4): qty 0.5

## 2020-09-12 MED ORDER — GUAIFENESIN ER 600 MG PO TB12: 600.0000 mg | ORAL_TABLET | Freq: Two times a day (BID) | ORAL | Status: DC

## 2020-09-12 MED ORDER — POLYETHYLENE GLYCOL 3350 17 G PO PACK
17.0000 g | PACK | Freq: Every day | ORAL | Status: DC | PRN
  Administered 2020-09-13 – 2020-09-14 (×2): 17 g via ORAL
  Filled 2020-09-12 (×2): qty 1

## 2020-09-12 MED ORDER — METHYLPREDNISOLONE SODIUM SUCC 125 MG IJ SOLR
60.0000 mg | Freq: Every day | INTRAMUSCULAR | Status: DC
  Administered 2020-09-12 – 2020-09-14 (×3): 60 mg via INTRAVENOUS
  Filled 2020-09-12 (×3): qty 2

## 2020-09-12 MED ORDER — LEVALBUTEROL HCL 0.63 MG/3ML IN NEBU: 0.6300 mg | INHALATION_SOLUTION | Freq: Two times a day (BID) | RESPIRATORY_TRACT | Status: DC

## 2020-09-12 MED ORDER — METHYLPREDNISOLONE SODIUM SUCC 125 MG IJ SOLR: 125.0000 mg | Freq: Two times a day (BID) | INTRAMUSCULAR | Status: DC

## 2020-09-12 MED ORDER — MONTELUKAST SODIUM 10 MG PO TABS
10.0000 mg | ORAL_TABLET | Freq: Every day | ORAL | Status: DC
  Administered 2020-09-12 – 2020-09-13 (×2): 10 mg via ORAL
  Filled 2020-09-12 (×3): qty 1

## 2020-09-12 MED ORDER — UMECLIDINIUM BROMIDE 62.5 MCG/INH IN AEPB
1.0000 | INHALATION_SPRAY | Freq: Every day | RESPIRATORY_TRACT | Status: DC
  Filled 2020-09-12: qty 7

## 2020-09-12 MED ORDER — RIVAROXABAN 20 MG PO TABS
20.0000 mg | ORAL_TABLET | Freq: Every day | ORAL | Status: DC
  Administered 2020-09-12 – 2020-09-14 (×3): 20 mg via ORAL
  Filled 2020-09-12 (×5): qty 1

## 2020-09-12 MED ORDER — GUAIFENESIN 100 MG/5ML PO SOLN
5.0000 mL | Freq: Once | ORAL | Status: AC
  Administered 2020-09-12: 100 mg via ORAL
  Filled 2020-09-12: qty 5

## 2020-09-12 MED ORDER — LEVALBUTEROL TARTRATE 45 MCG/ACT IN AERO
4.0000 | INHALATION_SPRAY | Freq: Once | RESPIRATORY_TRACT | Status: AC
  Administered 2020-09-12: 4 via RESPIRATORY_TRACT
  Filled 2020-09-12: qty 15

## 2020-09-12 MED ORDER — BENZONATATE 100 MG PO CAPS
100.0000 mg | ORAL_CAPSULE | Freq: Once | ORAL | Status: AC
  Administered 2020-09-12: 100 mg via ORAL
  Filled 2020-09-12: qty 1

## 2020-09-12 MED ORDER — PANTOPRAZOLE SODIUM 40 MG PO TBEC
40.0000 mg | DELAYED_RELEASE_TABLET | Freq: Every day | ORAL | Status: DC
  Administered 2020-09-12 – 2020-09-14 (×3): 40 mg via ORAL
  Filled 2020-09-12 (×3): qty 1

## 2020-09-12 MED ORDER — IPRATROPIUM BROMIDE 0.02 % IN SOLN: 0.5000 mg | Freq: Four times a day (QID) | RESPIRATORY_TRACT | Status: DC

## 2020-09-12 MED ORDER — IPRATROPIUM BROMIDE 0.02 % IN SOLN: 0.5000 mg | Freq: Two times a day (BID) | RESPIRATORY_TRACT | Status: DC

## 2020-09-12 MED ORDER — DM-GUAIFENESIN ER 30-600 MG PO TB12
1.0000 | ORAL_TABLET | Freq: Two times a day (BID) | ORAL | Status: DC
  Administered 2020-09-12 – 2020-09-14 (×4): 1 via ORAL
  Filled 2020-09-12 (×6): qty 1

## 2020-09-12 MED ORDER — IPRATROPIUM BROMIDE 0.02 % IN SOLN
0.5000 mg | Freq: Four times a day (QID) | RESPIRATORY_TRACT | Status: DC
  Administered 2020-09-12 – 2020-09-13 (×4): 0.5 mg via RESPIRATORY_TRACT
  Filled 2020-09-12 (×4): qty 2.5

## 2020-09-12 MED ORDER — LEVALBUTEROL HCL 0.63 MG/3ML IN NEBU
0.6300 mg | INHALATION_SOLUTION | Freq: Four times a day (QID) | RESPIRATORY_TRACT | Status: DC
  Administered 2020-09-12 (×2): 0.63 mg via RESPIRATORY_TRACT
  Filled 2020-09-12 (×2): qty 3

## 2020-09-12 MED ORDER — LOSARTAN POTASSIUM 50 MG PO TABS
75.0000 mg | ORAL_TABLET | Freq: Every day | ORAL | Status: DC
  Administered 2020-09-12 – 2020-09-14 (×3): 75 mg via ORAL
  Filled 2020-09-12: qty 1
  Filled 2020-09-12: qty 2
  Filled 2020-09-12: qty 1

## 2020-09-12 MED ORDER — IPRATROPIUM BROMIDE 0.02 % IN SOLN: 0.5000 mg | Freq: Four times a day (QID) | RESPIRATORY_TRACT | Status: DC | PRN

## 2020-09-12 MED ORDER — IPRATROPIUM-ALBUTEROL 0.5-2.5 (3) MG/3ML IN SOLN: 3.0000 mL | RESPIRATORY_TRACT | Status: DC

## 2020-09-12 NOTE — Progress Notes (Signed)
Pt arrives to floor alert and orientated times four. Pt does not c/o any pain. Pt received a tray for dinner. Pt is weighed, VS are taken, and TELE box is confirmed. Pt's needs are met and safety measures are maintained.

## 2020-09-12 NOTE — Plan of Care (Signed)
  Problem: Education: Goal: Knowledge of General Education information will improve Description Including pain rating scale, medication(s)/side effects and non-pharmacologic comfort measures Outcome: Progressing   

## 2020-09-12 NOTE — ED Provider Notes (Signed)
[facility] EMERGENCY DEPARTMENT Provider Note   CSN: [id number] Arrival date & time: 09/12/20  [id number]     History Chief Complaint  Patient presents with  . Shortness of Breath    [name] is a 70 y.o. female with a past medical history significant for A. fib on chronic Xarelto, asthma, GERD, hyperlipidemia, and hypertension who presents to the ED due to persistent shortness of breath.  Patient was evaluated yesterday in the ED due to an asthma exacerbation and discharged with Levalbuterol and prednisone. Patient returns to the ED due to persistent shortness of breath. Patient states shortness of breath is exacerbated by a productive cough and notes the congestion "gets caught in her chest" causing shortness of breath.  Denies fever and chills.  Denies abdominal pain, nausea, vomiting, diarrhea.  Denies sick contacts and no COVID exposures.  She states she has received both her COVID vaccines.  Per triage note, patient was given 125 Solu-Medrol and 2 g magnesium by EMS.  No hypoxia documented however, patient was noted to be using accessory muscles and placed on a nonrebreather.  During my initial evaluation, patient not on any oxygen, speaking in full sentences with no accessory muscle usage.  Patient denies tobacco use.  Admits to smoking marijuana which she stopped roughly 3 months ago.  Denies associated chest pain and lower extremity edema.  She has been compliant with her Xarelto for A. Fib.  No aggravating or alleviating symptoms.  History obtained from patient and past medical records. No interpreter used during encounter.      Past Medical History:  Diagnosis Date  . Asthma in adult, mild intermittent, uncomplicated   . Chronic anticoagulation   . Chronic constipation   . Eczema   . GERD (gastroesophageal reflux disease)   . History of kidney stones   . Hypertension   . Personal history of atrial fibrillation, s/p ablation, now in NSR but remains on  anticoaulation 11/26/2015   Followed in cardiology clinic     Patient Active Problem List   Diagnosis Date Noted  . Chronic seasonal allergic rhinitis 07/25/2020  . Asthma in adult, mild intermittent, uncomplicated   . Chronic bilateral lower abdominal pain 07/24/2020  . 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 ([place]) 07/23/2020  . Chronic lower back pain 02/14/2020  . Hx of vaginal bleeding, resolved, ultrasound completed 10/11/2018  . History of radiofrequency ablation (RFA) procedure for cardiac arrhythmia   . Primary osteoarthritis involving multiple joints 06/14/2018  . Hyperpigmented skin lesion 09/09/2017  . Osteoporosis without current pathological fracture 04/15/2017  . Constipation, chronic 10/19/2016  . Bilateral hearing loss due to cerumen impaction 09/10/2016  . Hyperlipidemia 03/18/2016  . HTN (hypertension), benign   . GERD (gastroesophageal reflux disease)   . Personal history of atrial fibrillation, now s/p ablation in NSR 11/26/2015  . Marijuana abuse 06/11/2015    Past Surgical History:  Procedure Laterality Date  . ATRIAL FIBRILLATION ABLATION N/A 09/30/2017   Procedure: ATRIAL FIBRILLATION ABLATION;  Surgeon: Thompson Grayer, MD;  Location: [facility];  Service: Cardiovascular;  Laterality: N/A;  . CHOLECYSTECTOMY N/A 12/08/2018   Procedure: LAPAROSCOPIC CHOLECYSTECTOMY WITH INTRAOPERATIVE CHOLANGIOGRAM;  Surgeon: Jovita Kussmaul, MD;  Location: [facility];  Service: General;  Laterality: N/A;  . ERCP N/A 12/09/2018   Procedure: ENDOSCOPIC RETROGRADE CHOLANGIOPANCREATOGRAPHY (ERCP);  Surgeon: Carol Ada, MD;  Location: [facility];  Service: Endoscopy;  Laterality: N/A;  . SPHINCTEROTOMY  12/09/2018   Procedure: SPHINCTEROTOMY;  Surgeon: Carol Ada, MD;  Location: [facility];  Service: Endoscopy;;  . TONSILLECTOMY    . TUBAL LIGATION       OB History   No obstetric history on file.     Family History  Problem Relation Age of Onset  . Heart  attack Mother        [name] age  . Breast cancer Sister        Late 8s; now s/p mastectomy   . Lung cancer Brother        x 2  . Cancer Sister   . Cancer Sister   . Cancer Sister   . Colon cancer Neg Hx   . Colon polyps Neg Hx   . Esophageal cancer Neg Hx   . Rectal cancer Neg Hx   . Stomach cancer Neg Hx     Social History   Tobacco Use  . Smoking status: Former Smoker    Quit date: 10/24/2015    Years since quitting: 4.8  . Smokeless tobacco: Never Used  Vaping Use  . Vaping Use: Never used  Substance Use Topics  . Alcohol use: Not Currently  . Drug use: Not Currently    Types: Marijuana    Comment: Smoked marijuana in the past.    Home Medications Prior to Admission medications   Medication Sig Start Date End Date Taking? Authorizing Provider  acetaminophen (TYLENOL) 500 MG tablet Take 2 tablets (1,000 mg total) by mouth every 8 (eight) hours as needed. Patient taking differently: Take 1,000 mg by mouth as needed. 05/20/19  Yes Charlesetta Shanks, MD  Azelastine-Fluticasone [phone] MCG/ACT SUSP One spray in each nostril twice a day, every day 07/24/20  Yes Angelica Pou, MD  diltiazem (CARDIZEM) 60 MG tablet Take 1 tablet (60 mg total) by mouth every 6 (six) hours as needed (Rapid AFIB over 100). 05/09/20  Yes Sherran Needs, NP  diltiazem (CARTIA XT) 120 MG 24 hr capsule Take 1 capsule (120 mg total) by mouth 2 (two) times daily. 01/22/20  Yes Sherran Needs, NP  fluticasone (FLOVENT HFA) 44 MCG/ACT inhaler Inhale 1 puff into the lungs daily. 04/02/20  Yes Angelica Pou, MD  lactulose (CEPHULAC) 10 g packet Take 1 packet (10 g total) by mouth 2 (two) times daily. Start with taking 1 packet 2 times a day and if that does not produce soft bowel movements daily you can increase it to 3 times a day 08/11/20  Yes Plunkett, Loree Fee, MD  levalbuterol (XOPENEX HFA) 45 MCG/ACT inhaler INHALE 1 PUFF INTO THE LUNGS EVERY 6 HOURS AS NEEDED FOR SHORTNESS OF BREATH 09/11/20  Yes  Dorie Rank, MD  levalbuterol Penne Lash) 0.63 MG/3ML nebulizer solution Take 3 mLs (0.63 mg total) by nebulization once for 1 dose. 09/11/20 09/11/20 Yes Dorie Rank, MD  linaclotide [facility]) 72 MCG capsule TAKE 1 CAPSULE(145 MCG) BY MOUTH DAILY BEFORE BREAKFAST 07/25/20  Yes Angelica Pou, MD  losartan (COZAAR) 50 MG tablet Take 1.5 tablets (75 mg total) by mouth daily. 07/25/20 12/07/20 Yes Angelica Pou, MD  montelukast (SINGULAIR) 10 MG tablet Take 10 mg by mouth at bedtime.   Yes [provider]  pantoprazole (PROTONIX) 40 MG tablet Take 1 tablet (40 mg total) by mouth daily. 07/24/20  Yes Angelica Pou, MD  predniSONE (DELTASONE) 50 MG tablet Take 1 tablet (50 mg total) by mouth daily. 09/11/20  Yes Dorie Rank, MD  tiotropium (SPIRIVA) 18 MCG inhalation capsule Place 1 capsule (18 mcg total) into inhaler and inhale daily.  04/16/20  Yes Angelica Pou, MD  carbamide peroxide [facility]) 6.5 % OTIC solution Place 5 drops into each ear to soften ear wax the night before and the morning of your next clinic appointment. Patient not taking: No sig reported 07/25/20   Angelica Pou, MD  Cholecalciferol (VITAMIN D) 50 MCG (2000 UT) tablet Take 1 tablet (2,000 Units total) by mouth daily. For bone health. Patient not taking: No sig reported 07/25/20   Angelica Pou, MD  HYDROcodone-acetaminophen (NORCO/VICODIN) 5-325 MG tablet Take 2 tablets by mouth every 4 (four) hours as needed. Patient not taking: No sig reported 08/11/20   Blanchie Dessert, MD  rivaroxaban (XARELTO) 20 MG TABS tablet Take 1 tablet (20 mg total) by mouth daily with supper. Patient not taking: No sig reported 11/07/19   Sherran Needs, NP    Allergies    Albuterol, Percocet [oxycodone-acetaminophen], Celecoxib, and Protonix [pantoprazole sodium]  Review of Systems   Review of Systems  Constitutional: Negative for chills and fever.  HENT: Positive for congestion. Negative for sore throat.    Respiratory: Positive for cough and shortness of breath.   Cardiovascular: Negative for chest pain.  Gastrointestinal: Negative for abdominal pain, diarrhea, nausea and vomiting.  All other systems reviewed and are negative.   Physical Exam Updated Vital Signs BP (!) 143/76   Pulse 81   Temp (!) 97.5 F (36.4 C) (Oral)   Resp (!) 23   SpO2 98%   Physical Exam Vitals and nursing note reviewed.  Constitutional:      General: She is not in acute distress.    Appearance: She is not toxic-appearing.  HENT:     Head: Normocephalic.  Eyes:     Pupils: Pupils are equal, round, and reactive to light.  Cardiovascular:     Rate and Rhythm: Normal rate and regular rhythm.     Pulses: Normal pulses.     Heart sounds: Normal heart sounds. No murmur heard. No friction rub. No gallop.   Pulmonary:     Effort: Pulmonary effort is normal.     Breath sounds: Normal breath sounds.     Comments: Late expiratory wheeze heard throughout. Decreased breath sounds at base of lungs. No accessory muscle usage. Speaking in full sentences. Abdominal:     General: Abdomen is flat. There is no distension.     Palpations: Abdomen is soft.     Tenderness: There is no abdominal tenderness. There is no guarding or rebound.  Musculoskeletal:     Cervical back: Neck supple.     Comments: No lower extremity edema. Negative Homan sign bilaterally.  Skin:    General: Skin is warm and dry.  Neurological:     General: No focal deficit present.     Mental Status: She is alert.  Psychiatric:        Mood and Affect: Mood normal.        Behavior: Behavior normal.     ED Results / Procedures / Treatments   Labs (all labs ordered are listed, but only abnormal results are displayed) Labs Reviewed  CBC WITH DIFFERENTIAL/PLATELET - Abnormal; Notable for the following components:      Result Value   WBC 13.8 (*)    RDW 15.7 (*)    Neutro Abs 11.7 (*)    All other components within normal limits  BASIC  METABOLIC PANEL - Abnormal; Notable for the following components:   Glucose, Bld 123 (*)    All other components within  normal limits  SARS CORONAVIRUS 2 (TAT 6-24 HRS)    EKG EKG Interpretation  Date/Time:  Friday September 12 2020 07:13:58 EST Ventricular Rate:  121 PR Interval:    QRS Duration: 88 QT Interval:  309 QTC Calculation: 439 R Axis:   59 Text Interpretation: Sinus tachycardia LAE, consider biatrial enlargement RSR' in V1 or V2, probably normal variant Artifact in lead(s) I II III aVR aVL aVF V2 SINCE LAST TRACING HEART RATE HAS INCREASED Confirmed by Malvin Johns [phone]) on 09/12/2020 7:20:22 AM   Radiology DG Chest Portable 1 View  Result Date: 09/12/2020 CLINICAL DATA:  Shortness of breath EXAM: PORTABLE CHEST 1 VIEW COMPARISON:  September 11, 2020 FINDINGS: Lungs are clear. Heart size and pulmonary vascularity are normal. No adenopathy. No bone lesions. There is aortic atherosclerosis. IMPRESSION: Lungs clear.  Cardiac silhouette normal. Aortic Atherosclerosis (ICD10-I70.0). Electronically Signed   By: Lowella Grip III M.D.   On: 09/12/2020 07:53   DG Chest Portable 1 View  Result Date: 09/11/2020 CLINICAL DATA:  Shortness of breath EXAM: PORTABLE CHEST 1 VIEW COMPARISON:  11/03/2019 FINDINGS: Large lung volumes with diaphragm flattening. There is chart history of asthma. There is no edema, consolidation, effusion, or pneumothorax. Normal heart size and mediastinal contours. Artifact from EKG leads. IMPRESSION: No acute finding. Electronically Signed   By: Monte Fantasia M.D.   On: 09/11/2020 07:39    Procedures Procedures (including critical care time)  Medications Ordered in ED Medications  benzonatate (TESSALON) capsule 100 mg (100 mg Oral Given 09/12/20 0743)  levalbuterol (XOPENEX HFA) inhaler 4 puff (4 puffs Inhalation Given 09/12/20 0810)  AeroChamber Plus Flo-Vu Large MISC 1 each (1 each Other Given 09/12/20 0811)  guaiFENesin (ROBITUSSIN) 100 MG/5ML  solution 100 mg (100 mg Oral Given 09/12/20 1245)    ED Course  I have reviewed the triage vital signs and the nursing notes.  Pertinent labs & imaging results that were available during my care of the patient were reviewed by me and considered in my medical decision making (see chart for details).    MDM Rules/Calculators/A&P                         70 year old female presents to the ED due to persistent shortness of breath.  Patient seen in the ED yesterday for the same complaint and diagnosed with an asthma exacerbation.  Chest x-ray yesterday was negative for signs of pneumonia, pneumothorax marijuana mediastinum.  Patient has been compliant with her Xarelto for A. fib.  Denies associated chest pain and lower extremity edema.  Upon arrival, patient afebrile, not tachycardic or hypoxic.  Patient on room air with O2 saturation above 95%.  Patient in no acute distress and nontoxic-appearing.  Physical exam significant for late expiratory wheeze heard throughout with decreased breath sounds at the base of the lungs.  No accessory muscle usage.  Patient speaking in full sentences.  No signs of DVT on exam.  Suspect symptoms related to asthma exacerbation.  Low suspicion for PE/DVT given no clinical signs of DVT on exam and wheeze heard throughout.  No tachycardia or hypoxia.  Patient given Solu-Medrol and magnesium prior to arrival. Per chart, patient is allergic to albuterol which has caused a. Fib in the past. COVID test order to rule out COVID infection. CXR to compare from yesterday to see if patient developed pneumonia. Xopenex given. Discussed case with Dr. Tamera Punt who evaluated patient at bedside and agrees with assessment and plan.  CBC significant for mild leukocytosis at 13.8, but otherwise reassuring.  BMP significant for hyperglycemia 123 with no anion gap.  Doubt DKA.  Normal renal function.  No major electrolyte derangements.  EKG personally reviewed which demonstrates sinus tachycardia with  no signs of acute ischemia.  9:19 AM reassessed patient at bedside he notes improvement in symptoms. Improved lung exam with slight expiratory wheeze. Patient speaking in full sentences. No accessory muscle usage.   Patient ambulated in the ED and dropped down to 90% on RA. Given patient is still wheezing and tachypneic, will call for admission for treatment of asthma exacerbation.  Discussed case with Dr. Myrtie Hawk with teaching service who agrees to admit patient for further treatment. COVID test pending.  Final Clinical Impression(s) / ED Diagnoses Final diagnoses:  Mild intermittent asthma with exacerbation    Rx / DC Orders ED Discharge Orders    None       Karie Kirks 09/12/20 1341    Malvin Johns, MD 09/12/20 1536

## 2020-09-12 NOTE — ED Notes (Signed)
Pt's O2 when sitting was 100 and during ambulation pts O2 dropped to 90.

## 2020-09-12 NOTE — ED Notes (Signed)
hospitalist at bedside

## 2020-09-12 NOTE — ED Notes (Signed)
Pt appears anxious, tearful at times, ED provider made aware

## 2020-09-12 NOTE — H&P (Signed)
Date: 09/12/2020               Patient Name:  [name] MRN: [id number]  DOB: 06-18-1951 Age / Sex: 70 y.o., female   PCP: Angelica Pou, MD         Medical Bartlett: Internal Medicine Teaching Bartlett         Attending Physician: Dr. Lucious Groves, DO    First Contact: Dr. Johnney Ou Pager: [phone]  Second Contact: Dr. Gilford Rile Pager: [phone]       After Hours (After 5p/  First Contact Pager: [phone]  weekends / holidays): Second Contact Pager: [phone]   Chief Complaint: Shortness of breath  History of Present Illness: 70 y.o. female with PMHx significant for asthma, A. fib on chronic Xarelto, GERD, HLD, chronic allergies and headaches, kidney stones, chronic constipation and HTN who was brought to ED by EMS with shortness of breath. She was in ED yesterday for same presentation and was managed for asthma exacerbation. She was sent home with Levalbuterol and prednisone. She did not start to take Prednison yet.She states that she has had congestion and phlegm in back of her nose that makes her cough. She feels she has sputum but it does not come up. Having more cough, made her asthma attack triggered again last night and she called EMS. She felt some chest pain due to sever cough that it went a way. She says that she called her heart doctor because of that and was told to discuss it when she comes to the hospital.  Regarding her cough and upper respiratory symptoms, she mentions she t that  productive cough and congestion for past few days but mentions that she has been dealing with sinus congestion for may be few months. She had more post nasal discharge and sinus pain recently and cough up some phlemn that aggravated her shortness of breath. She did not have fever or chills. No nausea or vomiting.  She is vaccinated x 2 against COVID-19 and denies sick contact. Given her shortness of breath exacerbated by productive cough, she had persistent difficulty breathing and  called EMS.  She was wheezing and had accessory muscle use per EMS evaluation but no hypoxia. She was given Solu-Medrol 125 mg and magnesium 2 gr by EMS.  ED course: She was stable on arrival, saturating well on RA and not in respiratory distress. But with expiaratory wheezing CXR with hyperinflation but no acute pathology.  She received levalbuterol in ED. but per EDP, she remained tachypnea despite initial Tx, and her O2 sat with ambulation dropped to 90% with ambulation and given recurrent ED visit for her symptoms, IMTS was consulted for admission. COVID-19 is pending in the time of admission.  Meds:   Current Meds  Medication Sig   acetaminophen (TYLENOL) 500 MG tablet Take 2 tablets (1,000 mg total) by mouth every 8 (eight) hours as needed. (Patient taking differently: Take 1,000 mg by mouth as needed.)   Azelastine-Fluticasone 137-50 MCG/ACT SUSP One spray in each nostril twice a day, every day   diltiazem (CARDIZEM) 60 MG tablet Take 1 tablet (60 mg total) by mouth every 6 (six) hours as needed (Rapid AFIB over 100).   diltiazem (CARTIA XT) 120 MG 24 hr capsule Take 1 capsule (120 mg total) by mouth 2 (two) times daily.   fluticasone (FLOVENT HFA) 44 MCG/ACT inhaler Inhale 1 puff into the lungs daily.   lactulose (CEPHULAC) 10 g packet Take 1 packet (10 g  total) by mouth 2 (two) times daily. Start with taking 1 packet 2 times a day and if that does not produce soft bowel movements daily you can increase it to 3 times a day   levalbuterol (XOPENEX HFA) 45 MCG/ACT inhaler INHALE 1 PUFF INTO THE LUNGS EVERY 6 HOURS AS NEEDED FOR SHORTNESS OF BREATH   levalbuterol (XOPENEX) 0.63 MG/3ML nebulizer solution Take 3 mLs (0.63 mg total) by nebulization once for 1 dose.   linaclotide (LINZESS) 72 MCG capsule TAKE 1 CAPSULE(145 MCG) BY MOUTH DAILY BEFORE BREAKFAST   losartan (COZAAR) 50 MG tablet Take 1.5 tablets (75 mg total) by mouth daily.   montelukast (SINGULAIR) 10 MG tablet Take 10  mg by mouth at bedtime.   pantoprazole (PROTONIX) 40 MG tablet Take 1 tablet (40 mg total) by mouth daily.   predniSONE (DELTASONE) 50 MG tablet Take 1 tablet (50 mg total) by mouth daily.   tiotropium (SPIRIVA) 18 MCG inhalation capsule Place 1 capsule (18 mcg total) into inhaler and inhale daily.     Allergies: Allergies as of 09/12/2020 - Review Complete 09/12/2020  Allergen Reaction Noted   Albuterol Palpitations 09/20/2017   Percocet [oxycodone-acetaminophen] Shortness Of Breath, Itching, and Anxiety 12/07/2018   Celecoxib Rash 04/11/2007   Protonix [pantoprazole sodium] Palpitations 11/28/2015   Past Medical History:  Diagnosis Date   Asthma in adult, mild intermittent, uncomplicated    Chronic anticoagulation    Chronic constipation    Eczema    GERD (gastroesophageal reflux disease)    History of kidney stones    Hypertension    Personal history of atrial fibrillation, s/p ablation, now in NSR but remains on anticoaulation 11/26/2015   Followed in cardiology clinic     Family History:  Family History  Problem Relation Age of Onset   Heart attack Mother        Young age   Breast cancer Sister        Late 20s; now s/p mastectomy    Lung cancer Brother        x 2   Cancer Sister    Cancer Sister    Cancer Sister    Colon cancer Neg Hx    Colon polyps Neg Hx    Esophageal cancer Neg Hx    Rectal cancer Neg Hx    Stomach cancer Neg Hx     Social History:  Smokes marijuana occasionally. Quit smoking about 8 years ago.   Review of Systems: A complete ROS was negative except as per HPI.   Physical Exam: Blood pressure 138/74, pulse 81, temperature (!) 97.5 F (36.4 C), temperature source Oral, resp. rate (!) 25, SpO2 96 %.  Constitutional: Thin appearing lady. No acute distress.  HENT:  Head: Normocephalic and atraumatic.  Eyes: Conjunctivae are normal, EOM nl Cardiovascular: RRR, nl S1S2, no murmur, no LEE Respiratory: Mild  expiartory wheezing, decreased breath sounds on bases. Effort normal and breath sounds normal. No respiratory distress. GI: Soft.No distension. There is no tenderness.  Neurological: Is alert and oriented x 3  Skin: Not diaphoretic. No erythema.  Psychiatric:  Normal mood and affect. Behavior is normal. Judgment and thought content normal.   EKG: personally reviewed my interpretation is NSR  CXR: personally reviewed my interpretation is hyperinflation  Assessment & Plan by Problem: Active Problems:   Personal history of atrial fibrillation, now s/p ablation in NSR   Asthma in adult, mild intermittent, uncomplicated   Asthma exacerbation  Asthma exacerbation  Reports not havving her  Fluvent inhaler for past 3 weeks.  She reports worsening of SOB when she coughs consistently. Asthma exacerbation could be aggravated by (likely viral) sinusitis Patient seen in ED with same presentation yesterday and was discharged with prednisone and levalbuterol.  But she continued to have cough and congestion that made her shortness of breath worse and she called EMS.  Home meds: Fluticasone inhaler 1 puff daily (did not use in past 3 weeks), Spiriva daily, prednisone 50 mg daily started, levalbuterol as needed, montelukast 10 mg daily -Levalbuterol every 6 hours -Atrovent nebulizer 6h -Levalbuterol nebulizer q6h (Patient has Hx of allergy -tachycardia and Afib- to Albuetrol) -continue home Singulair -Encruse Ellipta 1 puff daily -Solu-Medrol 60 mg QD  -Fluticasone inhaler -Dextromethorphan-Guaifenezine BID -If no improvement, consider Abx therapy for sinusitis  Paroxismal A. fib: On Xarelto at home.  -Currently sinus. -Continue home Xarelto 20 mg daily -Cardiac monitoring  HTN: -Continue home meds   Leukocytosis: WBC 13.2 No evidence of acute bacterial infection and URI seems to be subacute/chronic sinusitis.  -Can be due to steroid she received in past 2 days -Cbc tomorrow  Dispo:  Admit patient to Observation with expected length of stay less than 2 midnights.   SignedDewayne Hatch, MD 09/12/2020, 2:55 PM  Pager: [phone] After 5pm on weekdays and 1pm on weekends: On Call pager: [phone]

## 2020-09-12 NOTE — ED Triage Notes (Addendum)
Pt BIB EMS from home. SOB began around 0300. Issues with COPD and asthma. Had an asthma attack yesterday or day before.   Pt given solumedrol and mag by EMS  EMS: 98% RA while breathing w/ accessory muscles, then 100% NRM  18G L AC  BP 140/80 HR 126

## 2020-09-12 NOTE — ED Notes (Signed)
Attempted to call nursing report to 3E  ?

## 2020-09-12 NOTE — Progress Notes (Signed)
RN notified that patient was shortness of breath and wheezing after.  States that patient went into the bathroom instead of using the bedside commode.  After she got out, she was more short of breath with worsened wheezing.  When evaluated bedside, patient appears comfortable at rest with no acute respiratory distress.  O2 sat 97 - 100% on 2 L.  She states she had to strain while using the bathroom and that made her short of breath.   States that she felt panicked when short of breath.  Her last breathing treatment was at 7 PM. Reports that her breathing is better now and denies chest pain.   Vital sign: Pulse 95 NSR, BP 149/97, RR 22, 100% on 2L Cardio: Regular rate rhythm Pulmonary: Diffuse wheezes bilaterally  Plan: -Give patient a breathing treatment with levalbuterol and ipratropium, and make it every 6 hours as needed (history of tachycardia and A. fib with albuterol).  -Continue Pulmicort, Solu-Medrol, montelukast and Ellipta -Add MiraLAX as needed for constipation -Monitor vital signs and respiratory status

## 2020-09-12 NOTE — Telephone Encounter (Signed)
Need refill on  fluticasone (FLOVENT HFA) 44 MCG/ACT inhaler  ;pt contact [place], [place] AT [name]

## 2020-09-13 DIAGNOSIS — E785 Hyperlipidemia, unspecified: Secondary | ICD-10-CM | POA: Diagnosis not present

## 2020-09-13 DIAGNOSIS — F410 Panic disorder [episodic paroxysmal anxiety] without agoraphobia: Secondary | ICD-10-CM | POA: Diagnosis present

## 2020-09-13 DIAGNOSIS — Z79899 Other long term (current) drug therapy: Secondary | ICD-10-CM | POA: Diagnosis not present

## 2020-09-13 DIAGNOSIS — J4521 Mild intermittent asthma with (acute) exacerbation: Secondary | ICD-10-CM | POA: Diagnosis not present

## 2020-09-13 DIAGNOSIS — J302 Other seasonal allergic rhinitis: Secondary | ICD-10-CM | POA: Diagnosis not present

## 2020-09-13 DIAGNOSIS — I480 Paroxysmal atrial fibrillation: Secondary | ICD-10-CM | POA: Diagnosis not present

## 2020-09-13 DIAGNOSIS — Z7901 Long term (current) use of anticoagulants: Secondary | ICD-10-CM | POA: Diagnosis not present

## 2020-09-13 DIAGNOSIS — Z885 Allergy status to narcotic agent status: Secondary | ICD-10-CM | POA: Diagnosis not present

## 2020-09-13 DIAGNOSIS — L309 Dermatitis, unspecified: Secondary | ICD-10-CM | POA: Diagnosis not present

## 2020-09-13 DIAGNOSIS — Z87442 Personal history of urinary calculi: Secondary | ICD-10-CM | POA: Diagnosis not present

## 2020-09-13 DIAGNOSIS — K219 Gastro-esophageal reflux disease without esophagitis: Secondary | ICD-10-CM | POA: Diagnosis not present

## 2020-09-13 DIAGNOSIS — Z8249 Family history of ischemic heart disease and other diseases of the circulatory system: Secondary | ICD-10-CM | POA: Diagnosis not present

## 2020-09-13 DIAGNOSIS — M810 Age-related osteoporosis without current pathological fracture: Secondary | ICD-10-CM | POA: Diagnosis not present

## 2020-09-13 DIAGNOSIS — Z7952 Long term (current) use of systemic steroids: Secondary | ICD-10-CM | POA: Diagnosis not present

## 2020-09-13 DIAGNOSIS — Z20822 Contact with and (suspected) exposure to covid-19: Secondary | ICD-10-CM | POA: Diagnosis not present

## 2020-09-13 DIAGNOSIS — K5909 Other constipation: Secondary | ICD-10-CM | POA: Diagnosis not present

## 2020-09-13 DIAGNOSIS — Z87891 Personal history of nicotine dependence: Secondary | ICD-10-CM | POA: Diagnosis not present

## 2020-09-13 DIAGNOSIS — I10 Essential (primary) hypertension: Secondary | ICD-10-CM | POA: Diagnosis not present

## 2020-09-13 DIAGNOSIS — Z888 Allergy status to other drugs, medicaments and biological substances status: Secondary | ICD-10-CM | POA: Diagnosis not present

## 2020-09-13 LAB — COMPREHENSIVE METABOLIC PANEL
ALT: 20 U/L (ref 0–44)
AST: 26 U/L (ref 15–41)
Albumin: 3.7 g/dL (ref 3.5–5.0)
Alkaline Phosphatase: 69 U/L (ref 38–126)
Anion gap: 12 (ref 5–15)
BUN: 23 mg/dL (ref 8–23)
CO2: 24 mmol/L (ref 22–32)
Calcium: 9.8 mg/dL (ref 8.9–10.3)
Chloride: 103 mmol/L (ref 98–111)
Creatinine, Ser: 0.99 mg/dL (ref 0.44–1.00)
GFR, Estimated: >60 mL/min (ref >60)
Glucose, Bld: 119 mg/dL — ABNORMAL HIGH (ref 70–99)
Potassium: 3.5 mmol/L (ref 3.5–5.1)
Sodium: 139 mmol/L (ref 135–145)
Total Bilirubin: 0.3 mg/dL (ref 0.3–1.2)
Total Protein: 6.9 g/dL (ref 6.5–8.1)

## 2020-09-13 LAB — CBC
HCT: 37 % (ref 36.0–46.0)
Hemoglobin: 12 g/dL (ref 12.0–15.0)
MCH: 26.5 pg (ref 26.0–34.0)
MCHC: 32.4 g/dL (ref 30.0–36.0)
MCV: 81.7 fL (ref 80.0–100.0)
Platelets: 273 10*3/uL (ref 150–400)
RBC: 4.53 MIL/uL (ref 3.87–5.11)
RDW: 15.9 % — ABNORMAL HIGH (ref 11.5–15.5)
WBC: 19.4 10*3/uL — ABNORMAL HIGH (ref 4.0–10.5)
nRBC: 0 % (ref 0.0–0.2)

## 2020-09-13 MED ORDER — LINACLOTIDE 72 MCG PO CAPS
72.0000 ug | ORAL_CAPSULE | Freq: Every day | ORAL | Status: DC
  Administered 2020-09-14: 72 ug via ORAL
  Filled 2020-09-13 (×2): qty 1

## 2020-09-13 MED ORDER — ACETAMINOPHEN 325 MG PO TABS
650.0000 mg | ORAL_TABLET | Freq: Four times a day (QID) | ORAL | Status: DC | PRN
  Administered 2020-09-13 – 2020-09-14 (×2): 650 mg via ORAL
  Filled 2020-09-13 (×2): qty 2

## 2020-09-13 MED ORDER — LINACLOTIDE 72 MCG PO CAPS
72.0000 ug | ORAL_CAPSULE | Freq: Once | ORAL | Status: AC
  Administered 2020-09-13: 72 ug via ORAL
  Filled 2020-09-13: qty 1

## 2020-09-13 MED ORDER — FLUTICASONE PROPIONATE 50 MCG/ACT NA SUSP
2.0000 | Freq: Every day | NASAL | Status: DC
  Administered 2020-09-13 – 2020-09-14 (×2): 2 via NASAL
  Filled 2020-09-13: qty 16

## 2020-09-13 NOTE — Progress Notes (Addendum)
HD#0 Subjective:  Overnight Events: Episode of shortness of breath and wheezing while using bathroom. Patient went into bathroom rather than using bedside commode and appeared to over exert herself. Bedside evaluation by Dr. Alfonse Spruce, noted diffuse wheezing, but she endorsed feeling better.   This am, patient evaluated at bedside, resting comfortably without O2 supplementation. She endorses feeling better but continues to have shortness of breath and wheezing. She notes that when she was discharged from the ED prior to this admission, she went home and worsened. She endorses facial pain over facial/maxillary sinus'. She is able to cough up mucous, but still feels as though it gets stuck in her chest. She notes concern about smoking marijuana during the holidays and questions if this led to her worsening asthma. She denies smoking recently or being exposed to second hand smoke.   She also endorses stressors over the holidays that have worsened her chronic anxiety.   Objective:  Vital signs in last 24 hours: Vitals:   09/12/20 2226 09/12/20 2249 09/13/20 0400 09/13/20 0405  BP:  (!) 149/97 (!) 164/102   Pulse:   82   Resp: (!) 28 (!) 22 18   Temp:  97.7 F (36.5 C) 98.5 F (36.9 C) 98.5 F (36.9 C)  TempSrc:  Oral Oral Oral  SpO2: 97% 97% 94% 94%  Weight:    66.5 kg   Supplemental O2: Room Air  Physical Exam:  Physical Exam Vitals and nursing note reviewed.  Constitutional:      Comments: anxious  HENT:     Head: Normocephalic and atraumatic.     Comments: Tender to palpate over facial/maxilary sinus  Cardiovascular:     Rate and Rhythm: Normal rate and regular rhythm.     Pulses: Normal pulses.     Heart sounds: Normal heart sounds. No murmur heard. No friction rub. No gallop.   Pulmonary:     Breath sounds: Wheezing (diffuse, worse over left lung base) present.  Abdominal:     General: Abdomen is flat.     Palpations: Abdomen is soft.     Tenderness: There is no  abdominal tenderness. There is no guarding or rebound.     Hernia: No hernia is present.  Musculoskeletal:     Right lower leg: No edema.     Left lower leg: No edema.  Skin:    General: Skin is warm and dry.  Neurological:     General: No focal deficit present.     Mental Status: She is alert and oriented to person, place, and time. Mental status is at baseline.     Filed Weights   09/12/20 1802 09/13/20 0405  Weight: 66.3 kg 66.5 kg     Intake/Output Summary (Last 24 hours) at 09/13/2020 0902 Last data filed at 09/13/2020 0600 Gross per 24 hour  Intake 0 ml  Output 0 ml  Net 0 ml   Net IO Since Admission: 0 mL [09/13/20 0902]  Pertinent Labs: CBC Latest Ref Rng & Units 09/13/2020 09/12/2020 09/11/2020  WBC 4.0 - 10.5 K/uL 19.4(H) 13.8(H) 5.6  Hemoglobin 12.0 - 15.0 g/dL 12.0 13.0 13.5  Hematocrit 36.0 - 46.0 % 37.0 39.1 43.0  Platelets 150 - 400 K/uL 273 302 305    CMP Latest Ref Rng & Units 09/13/2020 09/12/2020 09/11/2020  Glucose 70 - 99 mg/dL 119(H) 123(H) 141(H)  BUN 8 - 23 mg/dL 23 16 12   Creatinine 0.44 - 1.00 mg/dL 0.99 0.92 0.84  Sodium 135 - 145 mmol/L  139 139 141  Potassium 3.5 - 5.1 mmol/L 3.5 3.5 3.1(L)  Chloride 98 - 111 mmol/L 103 105 107  CO2 22 - 32 mmol/L 24 22 22   Calcium 8.9 - 10.3 mg/dL 9.8 9.8 9.4  Total Protein 6.5 - 8.1 g/dL 6.9 - -  Total Bilirubin 0.3 - 1.2 mg/dL 0.3 - -  Alkaline Phos 38 - 126 U/L 69 - -  AST 15 - 41 U/L 26 - -  ALT 0 - 44 U/L 20 - -    Imaging: No results found.  Assessment/Plan:   Active Problems:   Personal history of atrial fibrillation, now s/p ablation in NSR   Asthma in adult, mild intermittent, uncomplicated   Asthma exacerbation   Patient Summary: [name] is a 70 y.o. with a pertinent PMH of asthma, afib on xarelto, chronic constipation on linzess, GERD who presented with nasa congestion, productive cough, and shortness of breath and admitted for asthma exacerbation and sinusitis.   Asthma  exacerbation  Patient endorses improvement, but continue to wheeze.  On exam patient with diffuse wheezing. Upon my evaluation she is not requiring O2 supplementation, but did become short of breath with longer conversations.   -Atrovent & levalbuterol nebulizer 6h -continue home Singulair -will discontinue elipta -Solu-Medrol 60 mg QD  -Dextromethorphan-Guaifenezine BID -If no improvement, consider Abx therapy for sinusitis  Sinusitis Patient has history of chronic seasonal allergic rhinitis. Suspect trigger of asthma exacerbation also triggered her sinusitis. Do not believe bacterial in etiology. Viral vs irritant. Has used flonase in the past, will continue.  -Flonase added  Paroxismal A. fib: On Xarelto at home.  -Currently normal sinus rhythm -Continue home Xarelto 20 mg daily -Cardiac monitoring  HTN: Patient with persistent hypertension since being admitted, on losartan daily.  -follow up with outpatient clinic   Leukocytosis:  WBC 13.2>19.4. No evidence of acute bacterial infection and URI seems to be subacute/chronic sinusitis. Will continue to monitor for infection -daily CBC  Hx of Panic Attacks/Anxiety Patient endorses continued anxiety, she noted providers in the passed talked with her about fixing this. We had a discussion that it is about managing anxiety and healthy methods of doing so. Recommend she follow up with her primary care provider to further assess this.  Diet: Normal IVF: None,None VTE: Xarelto Code: Full PT/OT recs: Pending,  Dispo: Anticipated discharge to Home in 1-2 days. Will transition to inpatient  Sanjuana Letters DO Internal Medicine Resident PGY-1 Pager [phone] Please contact the on call pager after 5 pm and on weekends at [phone].

## 2020-09-13 NOTE — Evaluation (Signed)
Physical Therapy Evaluation Patient Details Name: [name] MRN: [id number] DOB: 1951/03/30 Today's Date: 09/13/2020   History of Present Illness  [name] is a 70 y.o. with a pertinent PMH of asthma, afib on xarelto, chronic constipation on linzess, GERD who presented with nasa congestion, productive cough, and shortness of breath and admitted for asthma exacerbation and sinusitis    Clinical Impression  Pt walked 30 feet and was anxious and wheezy and feeling unsteady.  She reports her R leg hasnt been right - hurts some and feels weak (no pain reported during PT eval and tested 4/5, sensation intact).  While walking - she was noted to adduct it - creating narrow base of support.  Pt rested and then walked again with RW - less anxious and less SOB.  Her O2 sats on RA stayed 90-92%.  I feel pt would benefit from RW at home - to get more walkig and less anxious with it - HH to help continue her education on this.  Pt also educated on inspirometer - which was hard for her.  Pt to benefit from continued PT - in hospital or at home.    Follow Up Recommendations Home health PT;Supervision - Intermittent    Equipment Recommendations  Rolling walker with 5" wheels    Recommendations for Other Services       Precautions / Restrictions Precautions Precautions: None Precaution Comments: Pt denies history of falls Restrictions Weight Bearing Restrictions: No      Mobility  Bed Mobility Overal bed mobility: Independent               Patient Response: Cooperative  Transfers Overall transfer level: Independent Equipment used: Rolling walker (2 wheeled)                Ambulation/Gait Ambulation/Gait assistance: Supervision Gait Distance (Feet): 30 Feet Assistive device: None;Rolling walker (2 wheeled) Gait Pattern/deviations: Narrow base of support;Trunk flexed     General Gait Details: Pt walked first walk to the door and back (30 feet).  She was anxious about  falling, R leg wanting to adduct creating narrow base of support.  Pts O2 sats on RA - at rest and after walking 90-92%.  Pt then walked again - with RW - she was more confident (was flexed over walker - cued to stand more upright) and less nervous and less SOB.  Pt needs reminders how to use RW safely - esp turning.  Stairs            Wheelchair Mobility    Modified Rankin (Stroke Patients Only)       Balance Overall balance assessment: Mild deficits observed, not formally tested                                           Pertinent Vitals/Pain Pain Assessment: No/denies pain    Home Living Family/patient expects to be discharged to:: Private residence Living Arrangements: Alone   Type of Home: Apartment Home Access: Elevator       Home Equipment: None      Prior Function Level of Independence: Independent         Comments: Pt reports she does well on her own- does her own grocery shopping and cleaning etc.  Has never used RW.  Last asthma attack was 8 years ago     Hand Dominance  Extremity/Trunk Assessment        Lower Extremity Assessment Lower Extremity Assessment: RLE deficits/detail RLE Deficits / Details: R LE grossly 4/5, L grossly 5/5 RLE Sensation: WNL    Cervical / Trunk Assessment Cervical / Trunk Assessment: Normal  Communication   Communication: No difficulties  Cognition Arousal/Alertness: Awake/alert Behavior During Therapy: WFL for tasks assessed/performed Overall Cognitive Status: Within Functional Limits for tasks assessed                                        General Comments General comments (skin integrity, edema, etc.): Pt educated on using inspirometer - able to get to 500 ml only    Exercises     Assessment/Plan    PT Assessment Patient needs continued PT services  PT Problem List Decreased strength;Decreased mobility;Decreased knowledge of precautions;Decreased activity  tolerance;Cardiopulmonary status limiting activity;Decreased knowledge of use of DME       PT Treatment Interventions DME instruction;Therapeutic activities;Gait training;Therapeutic exercise;Patient/family education;Balance training;Functional mobility training    PT Goals (Current goals can be found in the Care Plan section)  Acute Rehab PT Goals Patient Stated Goal: to get home safely/ to breath better PT Goal Formulation: With patient Time For Goal Achievement: 09/27/20 Potential to Achieve Goals: Good    Frequency Min 3X/week   Barriers to discharge Decreased caregiver support      Co-evaluation               AM-PAC PT "6 Clicks" Mobility  Outcome Measure Help needed turning from your back to your side while in a flat bed without using bedrails?: None Help needed moving from lying on your back to sitting on the side of a flat bed without using bedrails?: None Help needed moving to and from a bed to a chair (including a wheelchair)?: None Help needed standing up from a chair using your arms (e.g., wheelchair or bedside chair)?: None Help needed to walk in hospital room?: A Little Help needed climbing 3-5 steps with a railing? : A Little 6 Click Score: 22    End of Session   Activity Tolerance: Patient limited by fatigue Patient left: in bed;with call bell/phone within reach Nurse Communication: Mobility status;Other (comment) (Encouraged pt to walk to bathroom with nursing staff to work on safe RW use) PT Visit Diagnosis: Unsteadiness on feet (R26.81);Difficulty in walking, not elsewhere classified (R26.2)    Time: 9150-5697 PT Time Calculation (min) (ACUTE ONLY): 39 min   Charges:   PT Evaluation $PT Eval Low Complexity: 1 Low PT Treatments $Gait Training: 8-22 mins $Therapeutic Activity: 8-22 mins        09/13/2020   Rande Lawman, PT   Loyal Buba 09/13/2020, 2:07 PM

## 2020-09-14 ENCOUNTER — Encounter (HOSPITAL_COMMUNITY): Payer: Self-pay | Admitting: Internal Medicine

## 2020-09-14 LAB — CBC
HCT: 39.1 % (ref 36.0–46.0)
Hemoglobin: 12.6 g/dL (ref 12.0–15.0)
MCH: 26.3 pg (ref 26.0–34.0)
MCHC: 32.2 g/dL (ref 30.0–36.0)
MCV: 81.6 fL (ref 80.0–100.0)
Platelets: 243 10*3/uL (ref 150–400)
RBC: 4.79 MIL/uL (ref 3.87–5.11)
RDW: 15.7 % — ABNORMAL HIGH (ref 11.5–15.5)
WBC: 15.8 10*3/uL — ABNORMAL HIGH (ref 4.0–10.5)
nRBC: 0 % (ref 0.0–0.2)

## 2020-09-14 LAB — BASIC METABOLIC PANEL
Anion gap: 10 (ref 5–15)
BUN: 15 mg/dL (ref 8–23)
CO2: 24 mmol/L (ref 22–32)
Calcium: 9.5 mg/dL (ref 8.9–10.3)
Chloride: 107 mmol/L (ref 98–111)
Creatinine, Ser: 0.83 mg/dL (ref 0.44–1.00)
GFR, Estimated: >60 mL/min (ref >60)
Glucose, Bld: 111 mg/dL — ABNORMAL HIGH (ref 70–99)
Potassium: 3.6 mmol/L (ref 3.5–5.1)
Sodium: 141 mmol/L (ref 135–145)

## 2020-09-14 MED ORDER — LEVALBUTEROL TARTRATE 45 MCG/ACT IN AERO: INHALATION_SPRAY | RESPIRATORY_TRACT | 1 refills | Status: DC

## 2020-09-14 MED ORDER — LEVALBUTEROL HCL 1.25 MG/0.5ML IN NEBU: 1.2500 mg | INHALATION_SOLUTION | Freq: Two times a day (BID) | RESPIRATORY_TRACT | Status: DC

## 2020-09-14 MED ORDER — LEVALBUTEROL HCL 1.25 MG/0.5ML IN NEBU
1.2500 mg | INHALATION_SOLUTION | Freq: Two times a day (BID) | RESPIRATORY_TRACT | Status: DC
  Administered 2020-09-14: 1.25 mg via RESPIRATORY_TRACT
  Filled 2020-09-14 (×2): qty 0.5

## 2020-09-14 MED ORDER — SORBITOL 70 % SOLN
960.0000 mL | TOPICAL_OIL | Freq: Once | ORAL | Status: AC
  Administered 2020-09-14: 960 mL via RECTAL
  Filled 2020-09-14: qty 473

## 2020-09-14 MED ORDER — MILK AND MOLASSES ENEMA: 1.0000 | Freq: Once | RECTAL | Status: DC

## 2020-09-14 MED ORDER — IPRATROPIUM BROMIDE 0.02 % IN SOLN: 0.5000 mg | Freq: Four times a day (QID) | RESPIRATORY_TRACT | Status: DC

## 2020-09-14 MED ORDER — IPRATROPIUM BROMIDE 0.02 % IN SOLN
0.5000 mg | Freq: Two times a day (BID) | RESPIRATORY_TRACT | Status: DC
  Administered 2020-09-14: 0.5 mg via RESPIRATORY_TRACT
  Filled 2020-09-14 (×2): qty 2.5

## 2020-09-14 NOTE — Progress Notes (Signed)
SMOG Enema was given to patient but she wasn't able to hold it long enough to work. While patient was trying to have bowel movement, some bleeding happened. Pt wanted stool softener by mouth. Pt's blood pressure also  went up to 178/110. Notified internal medicine. No new order received.  Dr. Wynetta Emery stated that hypertension will be addressed outpatient after discharge.

## 2020-09-14 NOTE — Progress Notes (Signed)
O2 assessment completed O2 at rest: 93% O2 with exertion: 93%  With frequent stop

## 2020-09-14 NOTE — Progress Notes (Signed)
Walking on Room Air O2 Sat at rest Room Air: 93% O2 Sat with exertion Room Air: 93% with frequent stop

## 2020-09-14 NOTE — Progress Notes (Signed)
HD#1 Subjective:  Overnight Events: None  Patient notes significant improvement of her breathing. She states she has been unable to pass her bowels the past few days. She usually passes them every day or so but has to take laxatives. She notes miralax and milk of mag never work for her.    Objective:  Vital signs in last 24 hours: Vitals:   09/13/20 2002 09/13/20 2024 09/14/20 0347 09/14/20 0355  BP:  130/75  (!) 165/86  Pulse:  87    Resp:  (!) 22  16  Temp:  98.1 F (36.7 C)  97.8 F (36.6 C)  TempSrc:  Oral  Oral  SpO2: 97% 95%  96%  Weight:   66.9 kg    Supplemental O2: Room Air  Physical Exam:  Physical Exam Vitals and nursing note reviewed.  Constitutional:      Appearance: She is not ill-appearing or toxic-appearing.  HENT:     Head: Normocephalic and atraumatic.     Comments: Tender to palpate over facial/maxilary sinus  Cardiovascular:     Rate and Rhythm: Normal rate and regular rhythm.     Comments: No murmur appreciated over right/left 2nd intercostal spaces Pulmonary:     Effort: Pulmonary effort is normal. No respiratory distress.     Breath sounds: Normal breath sounds. No wheezing.  Abdominal:     General: Abdomen is flat.     Palpations: Abdomen is soft.     Hernia: No hernia is present.  Skin:    General: Skin is warm and dry.  Neurological:     Mental Status: She is alert. Mental status is at baseline.     Filed Weights   09/12/20 1802 09/13/20 0405 09/14/20 0347  Weight: 66.3 kg 66.5 kg 66.9 kg     Intake/Output Summary (Last 24 hours) at 09/14/2020 0626 Last data filed at 09/13/2020 2030 Gross per 24 hour  Intake 240 ml  Output --  Net 240 ml   Net IO Since Admission: 240 mL [09/14/20 0626]  Pertinent Labs: CBC Latest Ref Rng & Units 09/14/2020 09/13/2020 09/12/2020  WBC 4.0 - 10.5 K/uL 15.8(H) 19.4(H) 13.8(H)  Hemoglobin 12.0 - 15.0 g/dL 12.6 12.0 13.0  Hematocrit 36.0 - 46.0 % 39.1 37.0 39.1  Platelets 150 - 400 K/uL 243 273  302    CMP Latest Ref Rng & Units 09/14/2020 09/13/2020 09/12/2020  Glucose 70 - 99 mg/dL 111(H) 119(H) 123(H)  BUN 8 - 23 mg/dL 15 23 16   Creatinine 0.44 - 1.00 mg/dL 0.83 0.99 0.92  Sodium 135 - 145 mmol/L 141 139 139  Potassium 3.5 - 5.1 mmol/L 3.6 3.5 3.5  Chloride 98 - 111 mmol/L 107 103 105  CO2 22 - 32 mmol/L 24 24 22   Calcium 8.9 - 10.3 mg/dL 9.5 9.8 9.8  Total Protein 6.5 - 8.1 g/dL - 6.9 -  Total Bilirubin 0.3 - 1.2 mg/dL - 0.3 -  Alkaline Phos 38 - 126 U/L - 69 -  AST 15 - 41 U/L - 26 -  ALT 0 - 44 U/L - 20 -    Imaging: No results found.  Assessment/Plan:   Active Problems:   Personal history of atrial fibrillation, now s/p ablation in NSR   Asthma in adult, mild intermittent, uncomplicated   Asthma exacerbation   Patient Summary: [name] is a 70 y.o. with a pertinent PMH of asthma, afib on xarelto, chronic constipation on linzess, GERD who presented with nasa congestion, productive cough, and  shortness of breath and admitted for asthma exacerbation and sinusitis.   Asthma exacerbation  Patient with significant improvement, without wheezing on physical exam. Patient not requiring oxygen supplementation. Will discharge home with refill of levoalbuterol. She has a follow up appointment in our clinic this upcoming Thursday 09/18/20. PT recommend home health PT and rolling walker, will place orders for both.  -continue home levalbuterol  -follow up in clinic  Sinusitis Patient has history of chronic seasonal allergic rhinitis. Suspect trigger of asthma exacerbation also triggered her sinusitis. Do not believe bacterial in etiology. Viral vs irritant. Has used flonase in the past, will continue.  -Flonase added  Paroxismal A. fib: On Xarelto at home.  -Currently normal sinus rhythm -Continue home Xarelto 20 mg   HTN: Continue home losartan, follow up in clinic for further HTN managment -follow up with outpatient clinic   Diet: Normal IVF:  None,None VTE: Xarelto Code: Full PT/OT recs: Home health PT/ Rolling Walker  Dispo: Anticipated discharge to Home today  [place] Internal Medicine Resident PGY-1 Pager [phone] Please contact the on call pager after 5 pm and on weekends at [phone].

## 2020-09-14 NOTE — Discharge Instructions (Signed)
[name] thank you for allowing [name] to care for you today! You were treated for an asthma exacerbation. We will be continuing on your home inhalers, please continue taking the levalbuterol.   You have a follow up appointment in the clinic this Thursday, Jan 27th     Asthma, Adult  Asthma is a long-term (chronic) condition that causes recurrent episodes in which the airways become tight and narrow. The airways are the passages that lead from the nose and mouth down into the lungs. Asthma episodes, also called asthma attacks, can cause coughing, wheezing, shortness of breath, and chest pain. The airways can also fill with mucus. During an attack, it can be difficult to breathe. Asthma attacks can range from minor to life threatening. Asthma cannot be cured, but medicines and lifestyle changes can help control it and treat acute attacks. What are the causes? This condition is believed to be caused by inherited (genetic) and environmental factors, but its exact cause is not known. There are many things that can bring on an asthma attack or make asthma symptoms worse (triggers). Asthma triggers are different for each person. Common triggers include:  Mold.  Dust.  Cigarette smoke.  Cockroaches.  Things that can cause allergy symptoms (allergens), such as animal dander or pollen from trees or grass.  Air pollutants such as household cleaners, wood smoke, smog, or Advertising account planner.  Cold air, weather changes, and winds (which increase molds and pollen in the air).  Strong emotional expressions such as crying or laughing hard.  Stress.  Certain medicines (such as aspirin) or types of medicines (such as beta-blockers).  Sulfites in foods and drinks. Foods and drinks that may contain sulfites include dried fruit, potato chips, and sparkling grape juice.  Infections or inflammatory conditions such as the flu, a cold, or inflammation of the nasal membranes (rhinitis).  Gastroesophageal  reflux disease (GERD).  Exercise or strenuous activity. What are the signs or symptoms? Symptoms of this condition may occur right after asthma is triggered or many hours later. Symptoms include:  Wheezing. This can sound like whistling when you breathe.  Excessive nighttime or early morning coughing.  Frequent or severe coughing with a common cold.  Chest tightness.  Shortness of breath.  Tiredness (fatigue) with minimal activity. How is this diagnosed? This condition is diagnosed based on:  Your medical history.  A physical exam.  Tests, which may include: ? Lung function studies and pulmonary studies (spirometry). These tests can evaluate the flow of air in your lungs. ? Allergy tests. ? Imaging tests, such as X-rays. How is this treated? There is no cure for this condition, but treatment can help control your symptoms. Treatment for asthma usually involves:  Identifying and avoiding your asthma triggers.  Using medicines to control your symptoms. Generally, two types of medicines are used to treat asthma: ? Controller medicines. These help prevent asthma symptoms from occurring. They are usually taken every day. ? Fast-acting reliever or rescue medicines. These quickly relieve asthma symptoms by widening the narrow and tight airways. They are used as needed and provide short-term relief.  Using supplemental oxygen. This may be needed during a severe episode.  Using other medicines, such as: ? Allergy medicines, such as antihistamines, if your asthma attacks are triggered by allergens. ? Immune medicines (immunomodulators). These are medicines that help control the immune system.  Creating an asthma action plan. An asthma action plan is a written plan for managing and treating your asthma attacks. This plan includes: ?  A list of your asthma triggers and how to avoid them. ? Information about when medicines should be taken and when their dosage should be  changed. ? Instructions about using a device called a peak flow meter. A peak flow meter measures how well the lungs are working and the severity of your asthma. It helps you monitor your condition. Follow these instructions at home: Controlling your home environment Control your home environment in the following ways to help avoid triggers and prevent asthma attacks:  Change your heating and air conditioning filter regularly.  Limit your use of fireplaces and wood stoves.  Get rid of pests (such as roaches and mice) and their droppings.  Throw away plants if you see mold on them.  Clean floors and dust surfaces regularly. Use unscented cleaning products.  Try to have someone else vacuum for you regularly. Stay out of rooms while they are being vacuumed and for a short while afterward. If you vacuum, use a dust mask from a hardware store, a double-layered or microfilter vacuum cleaner bag, or a vacuum cleaner with a HEPA filter.  Replace carpet with wood, tile, or vinyl flooring. Carpet can trap dander and dust.  Use allergy-proof pillows, mattress covers, and box spring covers.  Keep your bedroom a trigger-free room.  Avoid pets and keep windows closed when allergens are in the air.  Wash beddings every week in hot water and dry them in a dryer.  Use blankets that are made of polyester or cotton.  Clean bathrooms and kitchens with bleach. If possible, have someone repaint the walls in these rooms with mold-resistant paint. Stay out of the rooms that are being cleaned and painted.  Wash your hands often with soap and water. If soap and water are not available, use hand sanitizer.  Do not allow anyone to smoke in your home. General instructions  Take over-the-counter and prescription medicines only as told by your health care provider. ? Speak with your health care provider if you have questions about how or when to take the medicines. ? Make note if you are requiring more  frequent dosages.  Do not use any products that contain nicotine or tobacco, such as cigarettes and e-cigarettes. If you need help quitting, ask your health care provider. Also, avoid being exposed to secondhand smoke.  Use a peak flow meter as told by your health care provider. Record and keep track of the readings.  Understand and use the asthma action plan to help minimize, or stop an asthma attack, without needing to seek medical care.  Make sure you stay up to date on your yearly vaccinations as told by your health care provider. This may include vaccines for the flu and pneumonia.  Avoid outdoor activities when allergen counts are high and when air quality is low.  Wear a ski mask that covers your nose and mouth during outdoor winter activities. Exercise indoors on cold days if you can.  Warm up before exercising, and take time for a cool-down period after exercise.  Keep all follow-up visits as told by your health care provider. This is important. Where to find more information  For information about asthma, turn to the Centers for Disease Control and Prevention at http://www.clark.net/  For air quality information, turn to [employer] at https://www.miller-reyes.info/ Contact a health care provider if:  You have wheezing, shortness of breath, or a cough even while you are taking medicine to prevent attacks.  The mucus you cough up (sputum) is thicker than  usual.  Your sputum changes from clear or white to yellow, green, gray, or bloody.  Your medicines are causing side effects, such as a rash, itching, swelling, or trouble breathing.  You need to use a reliever medicine more than 2-3 times a week.  Your peak flow reading is still at 50-79% of your personal best after following your action plan for 1 hour.  You have a fever. Get help right away if:  You are getting worse and do not respond to treatment during an asthma attack.  You are short of breath when at rest or when doing very little  physical activity.  You have difficulty eating, drinking, or talking.  You have chest pain or tightness.  You develop a fast heartbeat or palpitations.  You have a bluish color to your lips or fingernails.  You are light-headed or dizzy, or you faint.  Your peak flow reading is less than 50% of your personal best.  You feel too tired to breathe normally. Summary  Asthma is a long-term (chronic) condition that causes recurrent episodes in which the airways become tight and narrow. These episodes can cause coughing, wheezing, shortness of breath, and chest pain.  Asthma cannot be cured, but medicines and lifestyle changes can help control it and treat acute attacks.  Make sure you understand how to avoid triggers and how and when to use your medicines.  Asthma attacks can range from minor to life threatening. Get help right away if you have an asthma attack and do not respond to treatment with your usual rescue medicines. This information is not intended to replace advice given to you by your health care provider. Make sure you discuss any questions you have with your health care provider. Document Revised: 05/09/2020 Document Reviewed: 12/12/2019 Elsevier Patient Education  2021 [employer].

## 2020-09-14 NOTE — Progress Notes (Addendum)
Patient complains of foul odor with urination. Pt states there is no burning pain or urgency accompanying the odor. Pt's temperature WNL. CBC ordered today awaiting results. Will continue to monitor.  MD Allyson Sabal made aware.

## 2020-09-15 ENCOUNTER — Telehealth: Payer: Self-pay | Admitting: *Deleted

## 2020-09-15 NOTE — Telephone Encounter (Signed)
-----   Message from Zola Button sent at 09/15/2020  9:42 AM EST ----- Regarding: FW: Home Health PT and [place]  ----- Message ----- From: Marty Heck, DO Sent: 09/14/2020   6:58 PM EST To: Jodi Geralds, RN, # Subject: Home Health PT and [place],  She saw PT in the hospital who requested HHPT and rolling walker for her. We were unable to obtain this due to evening discharge, but reviewing the notes it was partially for patient comfort. The order was placed during admission, does Dr. Johnney Ou need to place another order?   Thanks,  [employer]

## 2020-09-15 NOTE — Telephone Encounter (Signed)
Amedeo Plenty, RN; Maylene Roes; [place], Elinor Parkinson; [place], [place] C, [place] I'm sorry but at this time due to our staffing/capacity we are unable to accept. Thank you!

## 2020-09-15 NOTE — Telephone Encounter (Signed)
Theophilus Bones, RN; Leda Roys, Doren Custard; [place], [place]; 4 others   rolling walker order has been received

## 2020-09-15 NOTE — Telephone Encounter (Signed)
Amedeo Plenty, RN; Collier Bullock, [place], [place]; 4 others  Good afternoon, we have received this referral and sent in for review. We will let you know if accepted or not. Thank you!

## 2020-09-15 NOTE — Telephone Encounter (Addendum)
CM sent to Havery Moros at [facility] for question regarding [facility] PT and to [facility] Samples at [facility] regarding rolling walker. Hubbard Hartshorn, BSN, RN-BC

## 2020-09-17 NOTE — Discharge Summary (Signed)
Name: [name] MRN: [id number] DOB: 11-Jul-1951 70 y.o. PCP: Shelby Pou, MD  Date of Admission: 09/12/2020  6:37 AM Date of Discharge: 09/14/2020 Attending Physician: Dr. Angelia Bartlett  Discharge Diagnosis: Active Problems:   Personal history of atrial fibrillation, now s/p ablation in [facility]   Asthma in adult, mild intermittent, uncomplicated   Asthma exacerbation    Discharge Medications: Allergies as of 09/14/2020      Reactions   Albuterol Palpitations   Caused afib    Percocet [oxycodone-acetaminophen] Shortness Of Breath, Itching, Anxiety   Makes jittery, difficulty breathing, itching   Celecoxib Rash   Protonix [pantoprazole Sodium] Palpitations      Medication List    STOP taking these medications   levalbuterol 0.63 MG/3ML nebulizer solution Commonly known as: XOPENEX   predniSONE 50 MG tablet Commonly known as: DELTASONE     TAKE these medications   acetaminophen 500 MG tablet Commonly known as: TYLENOL Take 2 tablets (1,000 mg total) by mouth every 8 (eight) hours as needed. What changed: when to take this   Azelastine-Fluticasone 137-50 MCG/ACT Susp One spray in each nostril twice a day, every day   Debrox 6.5 % OTIC solution Generic drug: carbamide peroxide Place 5 drops into each ear to soften ear wax the night before and the morning of your next clinic appointment.   diltiazem 120 MG 24 hr capsule Commonly known as: Cartia XT Take 1 capsule (120 mg total) by mouth 2 (two) times daily.   diltiazem 60 MG tablet Commonly known as: CARDIZEM Take 1 tablet (60 mg total) by mouth every 6 (six) hours as needed (Rapid AFIB over 100).   fluticasone 44 MCG/ACT inhaler Commonly known as: FLOVENT HFA Inhale 1 puff into the lungs daily.   HYDROcodone-acetaminophen 5-325 MG tablet Commonly known as: NORCO/VICODIN Take 2 tablets by mouth every 4 (four) hours as needed.   lactulose 10 g packet Commonly known as: CEPHULAC Take 1 packet (10 g  total) by mouth 2 (two) times daily. Start with taking 1 packet 2 times a day and if that does not produce soft bowel movements daily you can increase it to 3 times a day   levalbuterol 45 MCG/ACT inhaler Commonly known as: XOPENEX HFA INHALE 1 PUFF INTO THE LUNGS EVERY 6 HOURS AS NEEDED FOR SHORTNESS OF BREATH   linaclotide 72 MCG capsule Commonly known as: Linzess TAKE 1 CAPSULE(145 MCG) BY MOUTH DAILY BEFORE BREAKFAST   losartan 50 MG tablet Commonly known as: COZAAR Take 1.5 tablets (75 mg total) by mouth daily.   montelukast 10 MG tablet Commonly known as: SINGULAIR Take 10 mg by mouth at bedtime.   pantoprazole 40 MG tablet Commonly known as: PROTONIX Take 1 tablet (40 mg total) by mouth daily.   rivaroxaban 20 MG Tabs tablet Commonly known as: XARELTO Take 1 tablet (20 mg total) by mouth daily with supper.   tiotropium 18 MCG inhalation capsule Commonly known as: SPIRIVA Place 1 capsule (18 mcg total) into inhaler and inhale daily.   Vitamin D 50 MCG (2000 UT) tablet Take 1 tablet (2,000 Units total) by mouth daily. For bone health.       Disposition and follow-up:   [name] was discharged from [facility] in Stable condition.  At the hospital follow up visit please address:  1.  Follow-up:  A. Asthma    B. Hypertension   C. Constipation  2.  Labs / imaging needed at time of follow-up:  None  3.  Pending labs/ test needing follow-up: None  4.  Medication Changes  Started: None  Stopped: None  Abx - None    Follow-up Appointments:  Follow-up Information    Shelby Pou, MD. Go on 09/18/2020.   Specialty: Internal Medicine Contact information: [address] [address] [phone]               Hospital Course by problem list:  Asthma Exacerbation Patient presented to ED for shortness of breath, cough, and chest congestion. Endorsed chest pain with cough. Also noted maxillary sinus  congestion for 2 weeks prior. She was seen in the ED and diagnosed with an asthma exacerbation. Etiology of exacerbation uncertain, but possibly due to viral sinusitis. She was admitted and given levoalbuterol, solumedrol, xopenex, and ipratropium scheduled. She progressed well with improvement of her shortness of breath and discharged home on her regular asthma regimen.   Hypertension During admission, patient was continued on her daily losartan. Her BP ranged from 140-160's, at times getting as high as 952 systolically. Patient was asymptomatic with these blood pressures. Discharged with BP of 148/97. Recommend repeat BP in clinic and have her check pressures at home and return with readings.  Constipation Patient endorsed not passing her bowels for a few days during admission. Patient felt as though if her bowels did not function properly than the rest of her would not be healthy. During the hospitalization she was given her home linzess and miralax without success. Patient requested strong laxative. Was given enema, however, she was unable to "hold enema n."    Pertinent Labs, Studies, and Procedures:  CBC Latest Ref Rng & Units 09/14/2020 09/13/2020 09/12/2020  WBC 4.0 - 10.5 K/uL 15.8(H) 19.4(H) 13.8(H)  Hemoglobin 12.0 - 15.0 g/dL 12.6 12.0 13.0  Hematocrit 36.0 - 46.0 % 39.1 37.0 39.1  Platelets 150 - 400 K/uL 243 273 302    CMP Latest Ref Rng & Units 09/14/2020 09/13/2020 09/12/2020  Glucose 70 - 99 mg/dL 111(H) 119(H) 123(H)  BUN 8 - 23 mg/dL 15 23 16   Creatinine 0.44 - 1.00 mg/dL 0.83 0.99 0.92  Sodium 135 - 145 mmol/L 141 139 139  Potassium 3.5 - 5.1 mmol/L 3.6 3.5 3.5  Chloride 98 - 111 mmol/L 107 103 105  CO2 22 - 32 mmol/L 24 24 22   Calcium 8.9 - 10.3 mg/dL 9.5 9.8 9.8  Total Protein 6.5 - 8.1 g/dL - 6.9 -  Total Bilirubin 0.3 - 1.2 mg/dL - 0.3 -  Alkaline Phos 38 - 126 U/L - 69 -  AST 15 - 41 U/L - 26 -  ALT 0 - 44 U/L - 20 -    DG Chest Portable 1 View  Result Date:  09/12/2020 CLINICAL DATA:  Shortness of breath EXAM: PORTABLE CHEST 1 VIEW COMPARISON:  September 11, 2020 FINDINGS: Lungs are clear. Heart size and pulmonary vascularity are normal. No adenopathy. No bone lesions. There is aortic atherosclerosis. IMPRESSION: Lungs clear.  Cardiac silhouette normal. Aortic Atherosclerosis (ICD10-I70.0). Electronically Signed   By: Lowella Grip III M.D.   On: 09/12/2020 07:53     Discharge Instructions: Discharge Instructions    Diet - low sodium heart healthy   Complete by: As directed    Increase activity slowly   Complete by: As directed       Signed: Riesa Pope, MD 09/17/2020, 6:44 PM   Pager: [phone]

## 2020-09-18 ENCOUNTER — Encounter: Payer: Self-pay | Admitting: Internal Medicine

## 2020-09-18 ENCOUNTER — Other Ambulatory Visit: Payer: Self-pay | Admitting: *Deleted

## 2020-09-18 ENCOUNTER — Other Ambulatory Visit: Payer: Self-pay

## 2020-09-18 ENCOUNTER — Ambulatory Visit (INDEPENDENT_AMBULATORY_CARE_PROVIDER_SITE_OTHER): Payer: Medicare Other | Admitting: Internal Medicine

## 2020-09-18 ENCOUNTER — Telehealth: Payer: Self-pay | Admitting: *Deleted

## 2020-09-18 VITALS — BP 144/94 | HR 86 | Temp 99.1°F | Ht 67.0 in | Wt 149.1 lb

## 2020-09-18 DIAGNOSIS — M810 Age-related osteoporosis without current pathological fracture: Secondary | ICD-10-CM | POA: Diagnosis not present

## 2020-09-18 DIAGNOSIS — K5909 Other constipation: Secondary | ICD-10-CM | POA: Diagnosis not present

## 2020-09-18 DIAGNOSIS — J302 Other seasonal allergic rhinitis: Secondary | ICD-10-CM

## 2020-09-18 DIAGNOSIS — J4520 Mild intermittent asthma, uncomplicated: Secondary | ICD-10-CM | POA: Diagnosis not present

## 2020-09-18 DIAGNOSIS — E785 Hyperlipidemia, unspecified: Secondary | ICD-10-CM

## 2020-09-18 DIAGNOSIS — I10 Essential (primary) hypertension: Secondary | ICD-10-CM | POA: Diagnosis not present

## 2020-09-18 DIAGNOSIS — J3089 Other allergic rhinitis: Secondary | ICD-10-CM

## 2020-09-18 MED ORDER — FLUTICASONE PROPIONATE HFA 44 MCG/ACT IN AERO: 1.0000 | INHALATION_SPRAY | Freq: Every day | RESPIRATORY_TRACT | 1 refills | Status: DC

## 2020-09-18 MED ORDER — ATORVASTATIN CALCIUM 20 MG PO TABS
20.0000 mg | ORAL_TABLET | Freq: Every day | ORAL | 3 refills | Status: DC
Start: 2020-09-18 — End: 2020-10-09

## 2020-09-18 MED ORDER — LOSARTAN POTASSIUM 50 MG PO TABS: 100.0000 mg | ORAL_TABLET | Freq: Every day | ORAL | 3 refills | Status: DC

## 2020-09-18 MED ORDER — CETIRIZINE HCL 10 MG PO TABS: 10.0000 mg | ORAL_TABLET | Freq: Every day | ORAL | 2 refills | Status: DC

## 2020-09-18 MED ORDER — BISACODYL 10 MG RE SUPP: 10.0000 mg | Freq: Every day | RECTAL | 0 refills | Status: AC | PRN

## 2020-09-18 NOTE — Telephone Encounter (Signed)
Pt is requesting a call back  About her medication

## 2020-09-18 NOTE — Telephone Encounter (Signed)
Patient called FO asking fro refill on linzess. #[phone] with 5 refills was sent to [facility] on [address] on 07/25/2020. Left detailed message on patient's self-identified VM with this information. Hubbard Hartshorn, BSN, RN-BC

## 2020-09-18 NOTE — Telephone Encounter (Signed)
Patient called back. Relayed this info to her and to call pharmacy for refill. Hubbard Hartshorn, BSN, RN-BC

## 2020-09-18 NOTE — Progress Notes (Addendum)
Established Patient Office Visit  Subjective:  Patient ID: [name], female    DOB: May 26, 1951  Age: 70 y.o. MRN: [id number]  CC: No chief complaint on file.   HPI [name] presents for hospital f/u of recent 2 day admission for asthma exacerbation which responded appropriately to routine management including bronchodilator and steroid therapy. She was discharged to continue prednisone 50 mg daily, resume her home Flovent MDI, Spiriva MDI  and montelukast daily, and to use Xopenex MDI for rescue. At time of d/c, walking with 93% 02 sat on RA, frequent stops. PT/OT evaluation prior to d/c recommended a rolling walker for stability while she recuperates her strength.  She lives alone. Not wheezing and not coughin, ordered Mucinex (no D) from [place] [facility] catalog.  No further productive cough, cough resolved.    Her most bothersome complaint however was frustration with bowel pattern - chronically constipated, she has been prescribed a number of medications including PEG and Linzess. She felt at last outpatient visit that the higher dose was "too strong" and dose was reduced to 72 mcg daily.   She continues to report that she has a history of "bowel blockage" for which she routinely motions to her R abdomen, "see, there's a scar there, I haven't been the same ever since".  Here is no hx of obstructed bowel; she may be referring to cholecystectomy followed by ERCP which did not identify obstructive stone.  It has been difficult to help her "unlearn" her self-explanations for her chronic symptoms.  Has not been using Linzess regularly.  Frequent gas pains, abdominal pain moves about.  Will increase Linzess to daily, try suppository.  BP running home (taking LUE by her friend in same living complex), SBP running consistenty above 140s.   Past Medical History:  Diagnosis Date  . Asthma in adult, mild intermittent, uncomplicated   . Chronic anticoagulation   . Chronic constipation   . Eczema    . GERD (gastroesophageal reflux disease)   . History of kidney stones   . Hypertension   . Personal history of atrial fibrillation, s/p ablation, now in NSR but remains on anticoaulation 11/26/2015   Followed in cardiology clinic     Family History  Problem Relation Age of Onset  . Heart attack Mother        [name] age  . Breast cancer Sister        Late 25s; now s/p mastectomy   . Lung cancer Brother        x 2  . Cancer Sister   . Cancer Sister   . Cancer Sister   . Colon cancer Neg Hx   . Colon polyps Neg Hx   . Esophageal cancer Neg Hx   . Rectal cancer Neg Hx   . Stomach cancer Neg Hx     Social History   Socioeconomic History  . Marital status: Single    Spouse name: Not on file  . Number of children: Not on file  . Years of education: Not on file  . Highest education level: Not on file  Occupational History  . Occupation: Retired  Tobacco Use  . Smoking status: Former Smoker    Quit date: 10/24/2015    Years since quitting: 4.9  . Smokeless tobacco: Never Used  Vaping Use  . Vaping Use: Never used  Substance and Sexual Activity  . Alcohol use: Not Currently  . Drug use: Not Currently    Types: Marijuana  Comment: Smoked marijuana in the past.  . Sexual activity: Yes    Partners: Male    Birth control/protection: Post-menopausal  Other Topics Concern  . Not on file  Social History Narrative   Lives alone in [place], [place] and has 2 daughters who are within driving distance   Enjoys watching soap operas, reading, exercise   Social Determinants of Health   Financial Resource Strain: Not on file  Food Insecurity: Not on file  Transportation Needs: Not on file  Physical Activity: Not on file  Stress: Not on file  Social Connections: Not on file  Intimate Partner Violence: Not on file    Outpatient Medications Prior to Visit  Medication Sig Dispense Refill  . acetaminophen (TYLENOL) 500 MG tablet Take 2 tablets (1,000 mg total) by mouth every 8  (eight) hours as needed. (Patient taking differently: Take 1,000 mg by mouth as needed.) 30 tablet 0  . Azelastine-Fluticasone 137-50 MCG/ACT SUSP One spray in each nostril twice a day, every day 23 g 5  . carbamide peroxide (DEBROX) 6.5 % OTIC solution Place 5 drops into each ear to soften ear wax the night before and the morning of your next clinic appointment. (Patient not taking: No sig reported) 15 mL 0  . Cholecalciferol (VITAMIN D) 50 MCG (2000 UT) tablet Take 1 tablet (2,000 Units total) by mouth daily. For bone health. (Patient not taking: No sig reported)    . diltiazem (CARDIZEM) 60 MG tablet Take 1 tablet (60 mg total) by mouth every 6 (six) hours as needed (Rapid AFIB over 100). 45 tablet 1  . diltiazem (CARTIA XT) 120 MG 24 hr capsule Take 1 capsule (120 mg total) by mouth 2 (two) times daily. 180 capsule 2  . fluticasone (FLOVENT HFA) 44 MCG/ACT inhaler Inhale 1 puff into the lungs daily. 30.8 g 1  . HYDROcodone-acetaminophen (NORCO/VICODIN) 5-325 MG tablet Take 2 tablets by mouth every 4 (four) hours as needed. (Patient not taking: No sig reported) 10 tablet 0  . lactulose (CEPHULAC) 10 g packet Take 1 packet (10 g total) by mouth 2 (two) times daily. Start with taking 1 packet 2 times a day and if that does not produce soft bowel movements daily you can increase it to 3 times a day 30 each 1  . levalbuterol (XOPENEX HFA) 45 MCG/ACT inhaler INHALE 1 PUFF INTO THE LUNGS EVERY 6 HOURS AS NEEDED FOR SHORTNESS OF BREATH 45 g 1  . linaclotide (LINZESS) 72 MCG capsule TAKE 1 CAPSULE(145 MCG) BY MOUTH DAILY BEFORE BREAKFAST 90 capsule 5  . losartan (COZAAR) 50 MG tablet Take 1.5 tablets (75 mg total) by mouth daily. 135 tablet 3  . montelukast (SINGULAIR) 10 MG tablet Take 10 mg by mouth at bedtime.    . pantoprazole (PROTONIX) 40 MG tablet Take 1 tablet (40 mg total) by mouth daily. 30 tablet 5  . rivaroxaban (XARELTO) 20 MG TABS tablet Take 1 tablet (20 mg total) by mouth daily with supper.  (Patient not taking: No sig reported) 30 tablet 6  . tiotropium (SPIRIVA) 18 MCG inhalation capsule Place 1 capsule (18 mcg total) into inhaler and inhale daily. 90 capsule 1   No facility-administered medications prior to visit.    Allergies  Allergen Reactions  . Albuterol Palpitations    Caused afib   . Percocet [Oxycodone-Acetaminophen] Shortness Of Breath, Itching and Anxiety    Makes jittery, difficulty breathing, itching  . Celecoxib Rash  . Protonix [Pantoprazole Sodium] Palpitations   ROS in  HPI   Objective:    Physical Exam  There were no vitals taken for this visit. Wt Readings from Last 3 Encounters:  09/14/20 147 lb 6.4 oz (66.9 kg)  09/11/20 149 lb (67.6 kg)  08/11/20 150 lb (68 kg)   Lungs are clear, no wheezing or rales, moving air to bases, no increased wob.  Heart RRR, no significant murmur.  No LE edema.  Abd flat, soft, nml bowel sounds, nonspecifically tender throughout and inconsistency noted with distraction. Nasal passages are edematous and red, smooth wet mucosa, scant drainage.  Ear canals occluded with wax (she didn't pick up the dobrox from the pharmacy as she didn't recall it was ordered).  Oropharynx without abnormalities.   Health Maintenance Due  Topic Date Due  . COVID-19 Vaccine (3 - Booster for Pfizer series) 05/27/2020    There are no preventive care reminders to display for this patient.   Lab Results  Component Value Date   CHOL 296 (H) 07/24/2020   Lab Results  Component Value Date   HDL 78 07/24/2020   Lab Results  Component Value Date   LDLCALC 198 (H) 07/24/2020   Lab Results  Component Value Date   TRIG 116 07/24/2020   Lab Results  Component Value Date   CHOLHDL 3.8 07/24/2020   Lab Results  Component Value Date   HGBA1C 5.3 03/17/2016     Assessment & Plan:   Ms. Dery has recovered from her asthma exacerbation and is breathing well at her baseline.  I'm concerned about her perseveration on bowel patterns and  nasal symptoms and accessing higher levels of care before coming to clinic (work in visits are nearly always available same day) despite having access to recommended therapies. Anxiety and perhaps cognitive status should be addressed at future visit.    See problem list-based charting.   Follow-up: 1 mo   Angelica Pou, MD

## 2020-09-18 NOTE — Telephone Encounter (Signed)
Return pt's call - no answer; left message on self-identified vm to call the office if she still has questions about her meds.

## 2020-09-18 NOTE — Progress Notes (Incomplete Revision)
Established Patient Office Visit  Subjective:  Patient ID: [name], female    DOB: Sep 14, 1950  Age: 70 y.o. MRN: [id number]  CC: No chief complaint on file.   HPI [name] presents for hospital f/u of recent 2 day admission for asthma exacerbation which responded appropriately to routine management including bronchodilator and steroid therapy. She was discharged to continue prednisone 50 mg daily, resume her home Flovent MDI, Spiriva MDI  and montelukast daily, and to use Xopenex MDI for rescue. At time of d/c, walking with 93% 02 sat on RA, frequent stops. PT/OT evaluation prior to d/c recommended a rolling walker for stability while she recuperates her strength.  She lives alone. Not wheezing and not coughin, ordered Mucinex (no D) from [place] [facility] catalog.  No further productive cough, cough resolved.     Her most bothersome complaint however was frustration with bowel pattern - chronically constipated, she has been prescribed a number of medications including PEG and Linzess. She felt at last outpatient visit that the higher dose was "too strong" and dose was reduced to 72 mcg daily.   She continues to report that she has a history of "bowel blockage" for which she routinely motions to her R abdomen, "see, there's a scar there, I haven't been the same ever since".  Here is no hx of obstructed bowel; she may be referring to cholecystectomy followed by ERCP which did not identify obstructive stone.  It has been difficult to help her "unlearn" her self-explanations for her chronic symptoms.  Has not been using Linzess regularly.  Frequent gas pains, abdominal pain moves about.  Will increase Linzess to daily, try suppository.  BP running home (taking LUE by her friend in same living complex), SBP running consistenty avove 140s.       Past Medical History:  Diagnosis Date  . Asthma in adult, mild intermittent, uncomplicated   . Chronic anticoagulation   . Chronic constipation    . Eczema   . GERD (gastroesophageal reflux disease)   . History of kidney stones   . Hypertension   . Personal history of atrial fibrillation, s/p ablation, now in NSR but remains on anticoaulation 11/26/2015   Followed in cardiology clinic       Family History  Problem Relation Age of Onset  . Heart attack Mother        [name] age  . Breast cancer Sister        Late 33s; now s/p mastectomy   . Lung cancer Brother        x 2  . Cancer Sister   . Cancer Sister   . Cancer Sister   . Colon cancer Neg Hx   . Colon polyps Neg Hx   . Esophageal cancer Neg Hx   . Rectal cancer Neg Hx   . Stomach cancer Neg Hx     Social History   Socioeconomic History  . Marital status: Single    Spouse name: Not on file  . Number of children: Not on file  . Years of education: Not on file  . Highest education level: Not on file  Occupational History  . Occupation: Retired  Tobacco Use  . Smoking status: Former Smoker    Quit date: 10/24/2015    Years since quitting: 4.9  . Smokeless tobacco: Never Used  Vaping Use  . Vaping Use: Never used  Substance and Sexual Activity  . Alcohol use: Not Currently  . Drug use: Not Currently  Types: Marijuana    Comment: Smoked marijuana in the past.  . Sexual activity: Yes    Partners: Male    Birth control/protection: Post-menopausal  Other Topics Concern  . Not on file  Social History Narrative   Lives alone in [place], [place] and has 2 daughters who are within driving distance   Enjoys watching soap operas, reading, exercise   Social Determinants of Health   Financial Resource Strain: Not on file  Food Insecurity: Not on file  Transportation Needs: Not on file  Physical Activity: Not on file  Stress: Not on file  Social Connections: Not on file  Intimate Partner Violence: Not on file    Outpatient Medications Prior to Visit  Medication Sig Dispense Refill  . acetaminophen (TYLENOL) 500 MG tablet Take 2 tablets (1,000 mg total) by  mouth every 8 (eight) hours as needed. (Patient taking differently: Take 1,000 mg by mouth as needed.) 30 tablet 0  . Azelastine-Fluticasone 137-50 MCG/ACT SUSP One spray in each nostril twice a day, every day 23 g 5  . carbamide peroxide (DEBROX) 6.5 % OTIC solution Place 5 drops into each ear to soften ear wax the night before and the morning of your next clinic appointment. (Patient not taking: No sig reported) 15 mL 0  . Cholecalciferol (VITAMIN D) 50 MCG (2000 UT) tablet Take 1 tablet (2,000 Units total) by mouth daily. For bone health. (Patient not taking: No sig reported)    . diltiazem (CARDIZEM) 60 MG tablet Take 1 tablet (60 mg total) by mouth every 6 (six) hours as needed (Rapid AFIB over 100). 45 tablet 1  . diltiazem (CARTIA XT) 120 MG 24 hr capsule Take 1 capsule (120 mg total) by mouth 2 (two) times daily. 180 capsule 2  . fluticasone (FLOVENT HFA) 44 MCG/ACT inhaler Inhale 1 puff into the lungs daily. 30.8 g 1  . HYDROcodone-acetaminophen (NORCO/VICODIN) 5-325 MG tablet Take 2 tablets by mouth every 4 (four) hours as needed. (Patient not taking: No sig reported) 10 tablet 0  . lactulose (CEPHULAC) 10 g packet Take 1 packet (10 g total) by mouth 2 (two) times daily. Start with taking 1 packet 2 times a day and if that does not produce soft bowel movements daily you can increase it to 3 times a day 30 each 1  . levalbuterol (XOPENEX HFA) 45 MCG/ACT inhaler INHALE 1 PUFF INTO THE LUNGS EVERY 6 HOURS AS NEEDED FOR SHORTNESS OF BREATH 45 g 1  . linaclotide (LINZESS) 72 MCG capsule TAKE 1 CAPSULE(145 MCG) BY MOUTH DAILY BEFORE BREAKFAST 90 capsule 5  . losartan (COZAAR) 50 MG tablet Take 1.5 tablets (75 mg total) by mouth daily. 135 tablet 3  . montelukast (SINGULAIR) 10 MG tablet Take 10 mg by mouth at bedtime.    . pantoprazole (PROTONIX) 40 MG tablet Take 1 tablet (40 mg total) by mouth daily. 30 tablet 5  . rivaroxaban (XARELTO) 20 MG TABS tablet Take 1 tablet (20 mg total) by mouth daily  with supper. (Patient not taking: No sig reported) 30 tablet 6  . tiotropium (SPIRIVA) 18 MCG inhalation capsule Place 1 capsule (18 mcg total) into inhaler and inhale daily. 90 capsule 1   No facility-administered medications prior to visit.    Allergies  Allergen Reactions  . Albuterol Palpitations    Caused afib   . Percocet [Oxycodone-Acetaminophen] Shortness Of Breath, Itching and Anxiety    Makes jittery, difficulty breathing, itching  . Celecoxib Rash  . Protonix [Pantoprazole Sodium]  Palpitations    ROS Review of Systems    Objective:    Physical Exam  There were no vitals taken for this visit. Wt Readings from Last 3 Encounters:  09/14/20 147 lb 6.4 oz (66.9 kg)  09/11/20 149 lb (67.6 kg)  08/11/20 150 lb (68 kg)     Health Maintenance Due  Topic Date Due  . COVID-19 Vaccine (3 - Booster for Pfizer series) 05/27/2020    There are no preventive care reminders to display for this patient.  Lab Results  Component Value Date   TSH 3.340 01/22/2016   Lab Results  Component Value Date   WBC 15.8 (H) 09/14/2020   HGB 12.6 09/14/2020   HCT 39.1 09/14/2020   MCV 81.6 09/14/2020   PLT 243 09/14/2020   Lab Results  Component Value Date   NA 141 09/14/2020   K 3.6 09/14/2020   CO2 24 09/14/2020   GLUCOSE 111 (H) 09/14/2020   BUN 15 09/14/2020   CREATININE 0.83 09/14/2020   BILITOT 0.3 09/13/2020   ALKPHOS 69 09/13/2020   AST 26 09/13/2020   ALT 20 09/13/2020   PROT 6.9 09/13/2020   ALBUMIN 3.7 09/13/2020   CALCIUM 9.5 09/14/2020   ANIONGAP 10 09/14/2020   Lab Results  Component Value Date   CHOL 296 (H) 07/24/2020   Lab Results  Component Value Date   HDL 78 07/24/2020   Lab Results  Component Value Date   LDLCALC 198 (H) 07/24/2020   Lab Results  Component Value Date   TRIG 116 07/24/2020   Lab Results  Component Value Date   CHOLHDL 3.8 07/24/2020   Lab Results  Component Value Date   HGBA1C 5.3 03/17/2016      Assessment &  Plan:   Problem List Items Addressed This Visit   None     No orders of the defined types were placed in this encounter.   Follow-up: No follow-ups on file.    Angelica Pou, MD

## 2020-09-18 NOTE — Patient Instructions (Addendum)
Great to see you are doing better!    For your blood pressure - starting tomorrow, take (2) of your losartan 50 mg dose, which is total of 100 mg every day.  TAke your BP at home, and at next visit, we'll decide if we should change to the 100 mg dose.  For your bowel problems, let's try a dulcolax suppository.  Please start taking your Linzess every day, like you do your BP medicine.  The goal is to get regular.  We may need to adjust the dose as we go.  You can CALL us if you have problems or questions.  I'm glad your asthma is doing better!  Keep taking your daily medicines.   We are starting a new medicine for high cholesterol today, called atorvastatin - you will take a 20 mg pill every day to help prevent blood vessel blockages.  We will recheck your blood test in 6 months.  Today we are checking the level of  Vitamin D in your body.  THis is important for bone strength, especially because you have osteoporosis!    Come see me in about a month, to recheck how you are doing, and to decide if any adjustments are needed for your medicines.  Please get a sinus/nasal flushing kit!  I think it could really help. Let's also restart cetirizine (Zyrtec) for your sinuses and nasal congestion.  Take care and stay well!  Dr. Jimmye Norman

## 2020-09-19 ENCOUNTER — Ambulatory Visit: Payer: Medicare Other | Admitting: *Deleted

## 2020-09-19 DIAGNOSIS — J4520 Mild intermittent asthma, uncomplicated: Secondary | ICD-10-CM

## 2020-09-19 DIAGNOSIS — I10 Essential (primary) hypertension: Secondary | ICD-10-CM

## 2020-09-19 LAB — VITAMIN D 25 HYDROXY (VIT D DEFICIENCY, FRACTURES): Vit D, 25-Hydroxy: 19.3 ng/mL — ABNORMAL LOW (ref 30.0–100.0)

## 2020-09-19 MED ORDER — MONTELUKAST SODIUM 10 MG PO TABS: 10.0000 mg | ORAL_TABLET | Freq: Every day | ORAL | 3 refills | Status: DC

## 2020-09-19 NOTE — Chronic Care Management (AMB) (Signed)
   09/19/2020  [name] Dec 28, 1950 [id number]  Successful outreach to patient in regards to referral received related to [facility] red [facility]  follow up. See image below. Patient was hospitalized at [facility]  from 12/21-1/23 for asthma exacerbation.      Patient states she saw Dr. Jimmye Norman at the [facility] in follow up yesterday and had all her questions and concerns addressed. In regards to the Emmi red flag related to who to call for changes in condition, advised patient that should she have concerns or develop problems requiring provider input over the weekend or after hours to call the hospital operator at [phone] and asked for the provider on call for the clinic to be paged, other wise she can call the clinic during business hours if she has concerns. . Patient wrote down the number and voiced understanding and compliance.    Kelli Churn RN, CCM, [facility] RN Care Manager [phone]

## 2020-09-19 NOTE — Telephone Encounter (Signed)
montelukast (SINGULAIR) 10 MG tablet, REFILL REQUEST @  [place], [place] AT [address] Phone:  [phone]  Fax:  [phone]

## 2020-09-20 ENCOUNTER — Ambulatory Visit (HOSPITAL_COMMUNITY)
Admission: EM | Admit: 2020-09-20 | Discharge: 2020-09-20 | Disposition: A | Discharge status: Home / Self Care | Payer: Medicare Other | Attending: Family Medicine | Admitting: Family Medicine

## 2020-09-20 ENCOUNTER — Encounter (HOSPITAL_COMMUNITY): Payer: Self-pay

## 2020-09-20 ENCOUNTER — Other Ambulatory Visit: Payer: Self-pay

## 2020-09-20 DIAGNOSIS — M7062 Trochanteric bursitis, left hip: Secondary | ICD-10-CM

## 2020-09-20 MED ORDER — DEXAMETHASONE SODIUM PHOSPHATE 10 MG/ML IJ SOLN
10.0000 mg | Freq: Once | INTRAMUSCULAR | Status: AC
  Administered 2020-09-20: 10 mg via INTRAMUSCULAR

## 2020-09-20 MED ORDER — DEXAMETHASONE SODIUM PHOSPHATE 10 MG/ML IJ SOLN
INTRAMUSCULAR | Status: AC
  Filled 2020-09-20: qty 1

## 2020-09-20 MED ORDER — HYDROCODONE-ACETAMINOPHEN 5-325 MG PO TABS: 1.0000 | ORAL_TABLET | Freq: Four times a day (QID) | ORAL | 0 refills | Status: DC | PRN

## 2020-09-20 NOTE — ED Provider Notes (Signed)
[facility]   [id number] 09/20/20 Arrival Time: 1110  ASSESSMENT & PLAN:  1. Trochanteric bursitis, left hip     Activities as tolerated.  Meds ordered this encounter  Medications  . dexamethasone (DECADRON) injection 10 mg  . HYDROcodone-acetaminophen (NORCO/VICODIN) 5-325 MG tablet    Sig: Take 1 tablet by mouth every 6 (six) hours as needed for moderate pain or severe pain.    Dispense:  8 tablet    Refill:  0    [place] Controlled Substances Registry consulted for this patient. I feel the risk/benefit ratio today is favorable for proceeding with this prescription for a controlled substance. Medication sedation precautions given.  Recommend:  Follow-up Information    Centre.   Why: If worsening or failing to improve as anticipated. Contact information: [address] [place] [phone]              Reviewed expectations re: course of current medical issues. Questions answered. Outlined signs and symptoms indicating need for more acute intervention. Patient verbalized understanding. After Visit Summary given.  SUBJECTIVE: History from: patient. [name] is a 70 y.o. female who reports intermittent left hip/upper leg pain. Gradual onset over past two weeks. OTC cream without relief. No injury. No extremity sensation changes or weakness. Normal bowel/bladder habits. No h/o similar. Reported lower back pain in triage but does not feel this is her lower back. Occasional upper buttock pain.  Past Surgical History:  Procedure Laterality Date  . ATRIAL FIBRILLATION ABLATION N/A 09/30/2017   Procedure: ATRIAL FIBRILLATION ABLATION;  Surgeon: Thompson Grayer, MD;  Location: [facility];  Service: Cardiovascular;  Laterality: N/A;  . CHOLECYSTECTOMY N/A 12/08/2018   Procedure: LAPAROSCOPIC CHOLECYSTECTOMY WITH INTRAOPERATIVE CHOLANGIOGRAM;  Surgeon: Jovita Kussmaul, MD;  Location: [facility];   Service: General;  Laterality: N/A;  . ERCP N/A 12/09/2018   Procedure: ENDOSCOPIC RETROGRADE CHOLANGIOPANCREATOGRAPHY (ERCP);  Surgeon: Carol Ada, MD;  Location: [facility];  Service: Endoscopy;  Laterality: N/A;  . SPHINCTEROTOMY  12/09/2018   Procedure: SPHINCTEROTOMY;  Surgeon: Carol Ada, MD;  Location: [facility];  Service: Endoscopy;;  . TONSILLECTOMY    . TUBAL LIGATION        OBJECTIVE:  Vitals:   09/20/20 1124  BP: 131/74  Pulse: 88  Resp: 18  Temp: 98.9 F (37.2 C)  TempSrc: Oral  SpO2: 100%    General appearance: alert; no distress HEENT: [place]; AT Neck: supple with FROM Resp: unlabored respirations Extremities: . LLE: warm with well perfused appearance; fairly well localized marked tenderness over left trochanteric bursa; without gross deformities; swelling: none; bruising: none; hip and knee ROM: normal CV: brisk extremity capillary refill of LLE; 2+ DP pulse of LLE. Skin: warm and dry; no visible rashes Neurologic: gait normal; normal sensation and strength of LLE Psychological: alert and cooperative; normal mood and affect    Allergies  Allergen Reactions  . Albuterol Palpitations    Caused afib   . Percocet [Oxycodone-Acetaminophen] Shortness Of Breath, Itching and Anxiety    Makes jittery, difficulty breathing, itching  . Celecoxib Rash  . Protonix [Pantoprazole Sodium] Palpitations    Past Medical History:  Diagnosis Date  . Asthma in adult, mild intermittent, uncomplicated   . Chronic anticoagulation   . Chronic constipation   . Eczema   . GERD (gastroesophageal reflux disease)   . History of kidney stones   . Hx of vaginal bleeding, resolved, ultrasound completed 10/11/2018  .  Hypertension   . Personal history of atrial fibrillation, s/p ablation, now in NSR but remains on anticoaulation 11/26/2015   Followed in cardiology clinic    Social History   Socioeconomic History  . Marital status: Single    Spouse name: Not on file  .  Number of children: Not on file  . Years of education: Not on file  . Highest education level: Not on file  Occupational History  . Occupation: Retired  Tobacco Use  . Smoking status: Former Smoker    Quit date: 10/24/2015    Years since quitting: 4.9  . Smokeless tobacco: Never Used  Vaping Use  . Vaping Use: Never used  Substance and Sexual Activity  . Alcohol use: Not Currently  . Drug use: Not Currently    Types: Marijuana    Comment: Smoked marijuana in the past.  . Sexual activity: Yes    Partners: Male    Birth control/protection: Post-menopausal  Other Topics Concern  . Not on file  Social History Narrative   Lives alone in [place], [place] and has 2 daughters who are within driving distance   Enjoys watching soap operas, reading, exercise   Social Determinants of Health   Financial Resource Strain: Not on file  Food Insecurity: Not on file  Transportation Needs: Not on file  Physical Activity: Not on file  Stress: Not on file  Social Connections: Not on file   Family History  Problem Relation Age of Onset  . Heart attack Mother        [name] age  . Breast cancer Sister        Late 5s; now s/p mastectomy   . Lung cancer Brother        x 2  . Cancer Sister   . Cancer Sister   . Cancer Sister   . Colon cancer Neg Hx   . Colon polyps Neg Hx   . Esophageal cancer Neg Hx   . Rectal cancer Neg Hx   . Stomach cancer Neg Hx    Past Surgical History:  Procedure Laterality Date  . ATRIAL FIBRILLATION ABLATION N/A 09/30/2017   Procedure: ATRIAL FIBRILLATION ABLATION;  Surgeon: Thompson Grayer, MD;  Location: [facility];  Service: Cardiovascular;  Laterality: N/A;  . CHOLECYSTECTOMY N/A 12/08/2018   Procedure: LAPAROSCOPIC CHOLECYSTECTOMY WITH INTRAOPERATIVE CHOLANGIOGRAM;  Surgeon: Jovita Kussmaul, MD;  Location: [facility];  Service: General;  Laterality: N/A;  . ERCP N/A 12/09/2018   Procedure: ENDOSCOPIC RETROGRADE CHOLANGIOPANCREATOGRAPHY (ERCP);  Surgeon: Carol Ada, MD;  Location: [facility];  Service: Endoscopy;  Laterality: N/A;  . SPHINCTEROTOMY  12/09/2018   Procedure: SPHINCTEROTOMY;  Surgeon: Carol Ada, MD;  Location: [facility];  Service: Endoscopy;;  . TONSILLECTOMY    . TUBAL LIGATION        Vanessa Kick, MD 09/20/20 [phone]

## 2020-09-20 NOTE — ED Triage Notes (Signed)
Pt c/o intermittent bilateral leg pain and lower back pain X 2 weeks. Pt states she has been applying OTC cream for the pain with no relief. Pt states the lower back pain radiates to her legs.

## 2020-09-20 NOTE — Progress Notes (Signed)
Internal Medicine Clinic Resident  I have personally reviewed this encounter including the documentation in this note and/or discussed this patient with the care management provider. I will address any urgent items identified by the care management provider and will communicate my actions to the patient's PCP. I have reviewed the patient's CCM visit with my supervising attending, Dr Williams.  Damian Hofstra, MD  [facility] PGY-2 09/20/2020    

## 2020-09-20 NOTE — Discharge Instructions (Addendum)

## 2020-09-22 ENCOUNTER — Telehealth: Payer: Self-pay

## 2020-09-22 NOTE — Telephone Encounter (Signed)
Requesting to speak with a nurse about pain med, please call pt back.  

## 2020-09-22 NOTE — Telephone Encounter (Signed)
TC to patient, she states she went to urgent care Saturday because "her leg was giving out and hurting".  She received a steroid injection and #8 hydrocodone-acetaminophen.  She was told to f/u with Sports medicine.  She is asking:  1.  For more pain medication  RN instructed patient to call sports medicine for a f/u appt, she states she has never been there.   Will forward to PCP. Thank you, SChaplin, RN,BSN

## 2020-09-24 ENCOUNTER — Other Ambulatory Visit: Payer: Self-pay

## 2020-09-24 ENCOUNTER — Ambulatory Visit (INDEPENDENT_AMBULATORY_CARE_PROVIDER_SITE_OTHER): Payer: Medicare Other | Admitting: Family Medicine

## 2020-09-24 VITALS — BP 140/86 | Ht 67.0 in | Wt 150.0 lb

## 2020-09-24 DIAGNOSIS — M545 Low back pain, unspecified: Secondary | ICD-10-CM

## 2020-09-24 DIAGNOSIS — G8929 Other chronic pain: Secondary | ICD-10-CM

## 2020-09-24 MED ORDER — MELOXICAM 15 MG PO TABS: ORAL_TABLET | ORAL | 0 refills | Status: DC

## 2020-09-24 NOTE — Progress Notes (Addendum)
SUBJECTIVE:   CHIEF COMPLAINT / HPI:   Low Back with Left Leg Pain [name] is a new patient to this practice 70 year old female who is coming in for evaluation of left-sided lower back pain and left leg pain.  She was recently seen in urgent care diagnosed with greater trochanteric bursitis received an injection into her arm of dexamethasone as well as given Norco.  She states that the shot did not seem to help very much but thinks that the tablets if she takes 2 does help with pain some.  Her pain today is more in the low back and the left leg over the anterior thigh.  She says it jumps around and sometimes has pain in her other leg but most of her pain is with her left leg.  She was recently hospitalized due to asthma for 4 days and so has felt more weak recently.  She states the pain is constant and she has had no recent falls or injuries.  PERTINENT  PMH / PSH: HTN, Hx of A. Fib s/p ablation, Asthma, on Xarelto  OBJECTIVE:   BP 140/86   Ht 5\' 7"  (1.702 m)   Wt 150 lb (68 kg)   BMI 23.49 kg/m   No flowsheet data found.  Lumbar spine:  - Inspection: no gross deformity or asymmetry, swelling or ecchymosis. No skin changes - Palpation: No TTP over the spinous processes, paraspinal muscles, or SI joints b/l - ROM: full active ROM of the lumbar spine in flexion and extension without pain - Strength: 4/5 strength of lower extremity in L4-S1 nerve root distributions b/l - Neuro: sensation intact in the L4-S1 nerve root distribution b/l Special Tests:   - Straight Leg Raise test: NEG  - Slump test: NEG  Hip, Left: No obvious rash, erythema, ecchymosis, or edema. ROM full in all directions without pain; Strength 4/5 in IR/ER/Flex/Ext/Abd/Add. Greater trochanter without tenderness to palpation. No tenderness over piriformis. No SI joint tenderness and normal minimal SI movement. Special Test:   - FABER/FADIR test: NEG   - Passive Log Roll test: NEG  ASSESSMENT/PLAN:    Chronic lower back pain Patient presents with left-sided low back pain with pain in the left leg distribution over the anterior and posterior thigh sometimes radiating down to the knee and passed into the calf.  Specific testing for SI joint pain, glenohumeral joint pain were all negative.  She does not seem to have any type of pain distribution or numbness or tingling to suggest nerve involvement or impingement.  Sciatic testing all negative as well.  I think part of her pain is also coming from deconditioning as she was recently hospitalized for 4 days. -We will treat with meloxicam 15 mg once daily with food scheduled for 2 weeks and then as needed for total quantity of 30.  Aware that she is on Xarelto so this will increase her chance of bleeding however given she is over 68 muscle relaxer would not be ideal.  Also she was given Norco at her recent visit and narcotics for chronic low back pain is also not ideal. Recommended formal physical therapy however she says she has tried this in the past with little benefit.  I will have her do some home exercises as well as recommended capsaicin cream.  I will see her back in 4 weeks if she is not much better could try doing some x-rays of the low back to assess for any type of facet joint arthropathy.  We will encourage her to seek PT at that time if not much better.     Nuala Alpha, DO PGY-4, Sports Medicine Fellow [place]  Addendum:  I was the preceptor for this visit and available for immediate consultation.  Karlton Lemon MD Kirt Boys

## 2020-09-24 NOTE — Patient Instructions (Addendum)
It was great to meet you today! Thank you for letting me participate in your care!  Today, we discussed low back pain and your left leg pain. I have prescribed you a medication called Meloxicam. Please take it as prescribed. I also want you to get Voltaren gel and use it four times per day on the areas that hurt. I also want you to do the exercises I gave you 3-4 times per week.  I will see you back in 4 weeks.  Be well, Harolyn Rutherford, DO PGY-4, Sports Medicine Fellow [place]

## 2020-09-24 NOTE — Assessment & Plan Note (Addendum)
Patient presents with left-sided low back pain with pain in the left leg distribution over the anterior and posterior thigh sometimes radiating down to the knee and passed into the calf.  Specific testing for SI joint pain, glenohumeral joint pain were all negative.  She does not seem to have any type of pain distribution or numbness or tingling to suggest nerve involvement or impingement.  Sciatic testing all negative as well.  I think part of her pain is also coming from deconditioning as she was recently hospitalized for 4 days. -We will treat with meloxicam 15 mg once daily with food scheduled for 2 weeks and then as needed for total quantity of 30.  Aware that she is on Xarelto so this will increase her chance of bleeding however given she is over 68 muscle relaxer would not be ideal.  Also she was given Norco at her recent visit and narcotics for chronic low back pain is also not ideal. Recommended formal physical therapy however she says she has tried this in the past with little benefit.  I will have her do some home exercises as well as recommended capsaicin cream.  I will see her back in 4 weeks if she is not much better could try doing some x-rays of the low back to assess for any type of facet joint arthropathy.  We will encourage her to seek PT at that time if not much better.

## 2020-09-24 NOTE — Progress Notes (Signed)
CCM encounter reviewed and plan approved as noted by Dr. Aslam. 

## 2020-09-24 NOTE — Telephone Encounter (Signed)
She did go to her appt today and was prescribed NSAID.  Thank you for the message.

## 2020-09-30 ENCOUNTER — Telehealth: Payer: Self-pay

## 2020-09-30 IMAGING — CT CT CERVICAL SPINE W/O CM
4 series · 15 of 33 positions shown, 18 images · non-contrast
Comparison: None.

CLINICAL DATA: 68-year-old female with C-spine trauma.

EXAM:
CT CERVICAL SPINE WITHOUT CONTRAST
TECHNIQUE: Multidetector CT imaging of the cervical spine was performed without
intravenous contrast. Multiplanar CT image reconstructions were also
generated.

[Series 6: c spine soft · axial · 0.35mm/px · z∈[+898,+932]mm · 2 of 101 slices shown]
[im 17/101  soft-tissue]
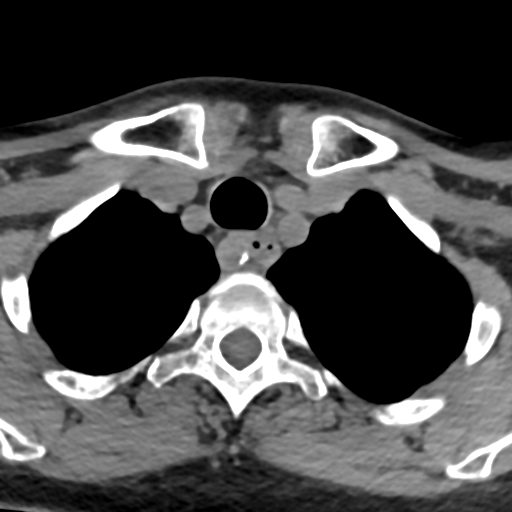
[im 34/101  soft-tissue]
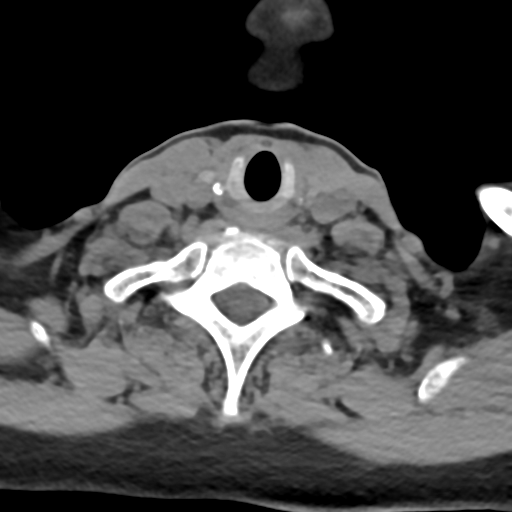

[Series 9: sag bone · sagittal · 0.39mm/px · 5 of 120 slices shown, 6 images]
[im 40/120  bone]
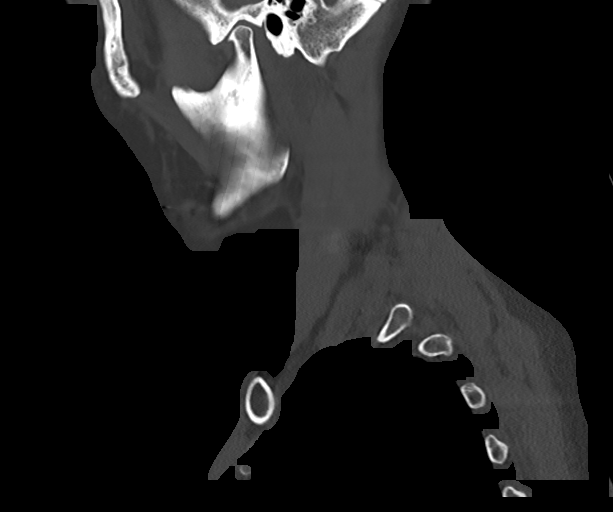
[im 50/120  bone]
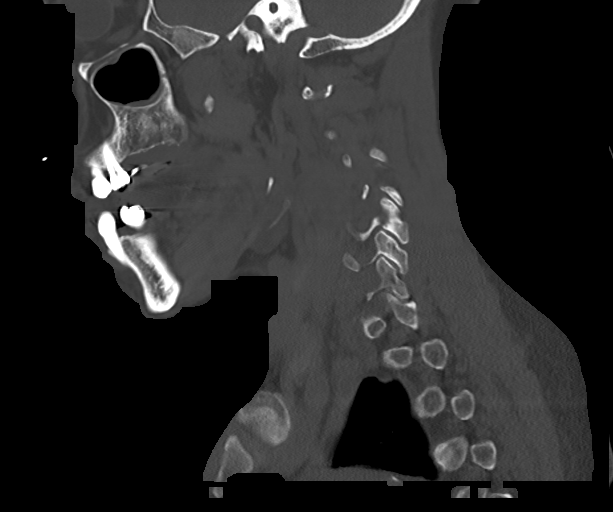
[im 60/120  soft-tissue]
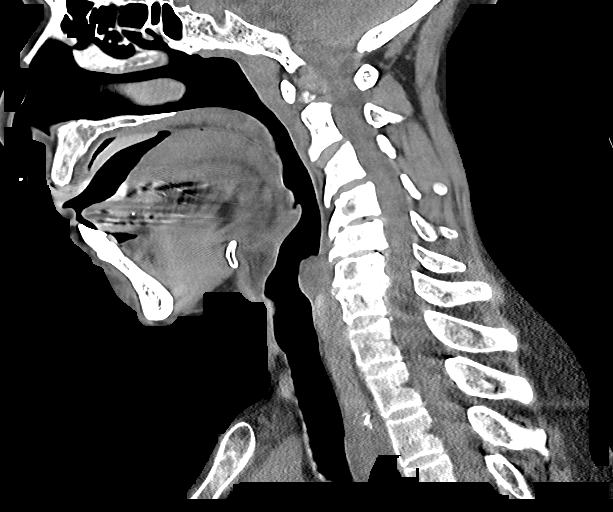
[im 60/120  bone]
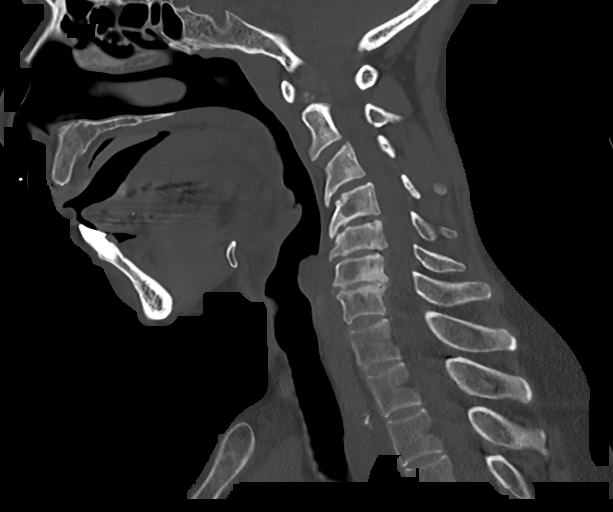
[im 70/120  bone]
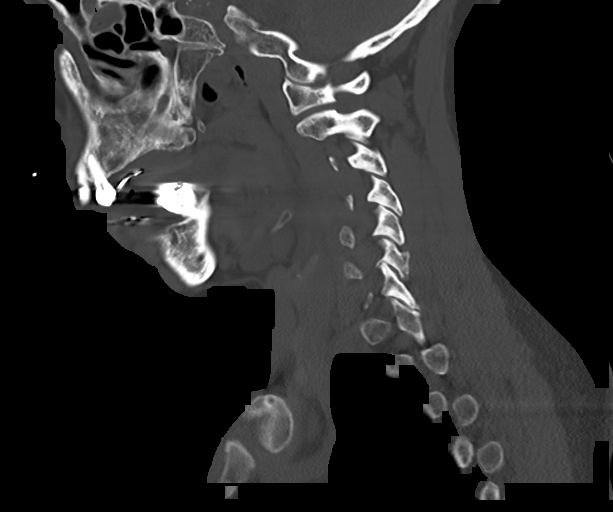
[im 80/120  bone]
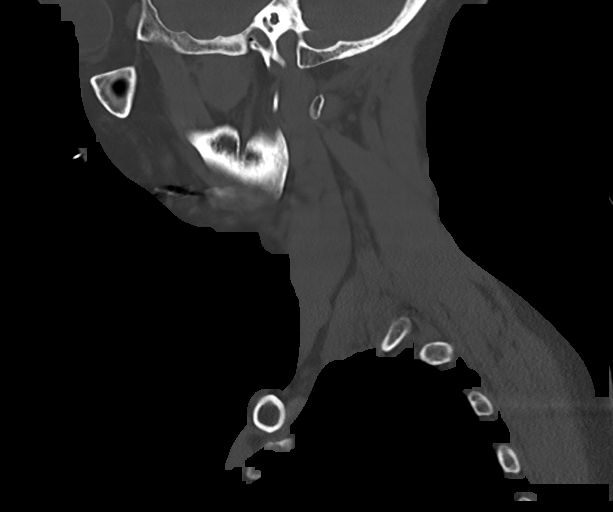

[Series 10: cor bone · coronal · 0.39mm/px · 3 of 122 slices shown]
[im 25/122  bone]
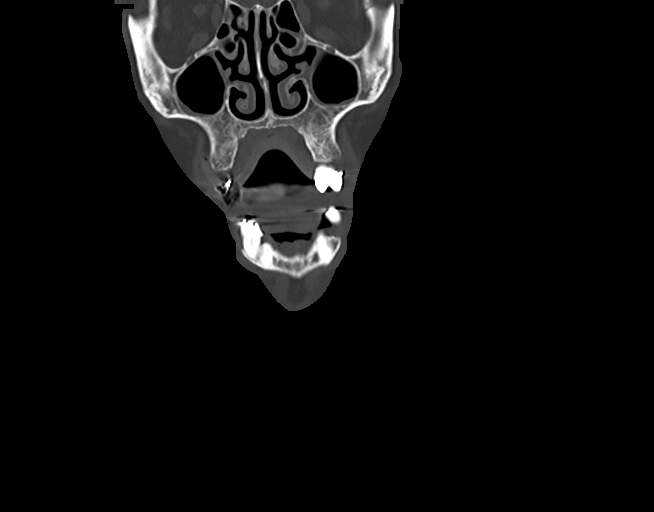
[im 49/122  bone]
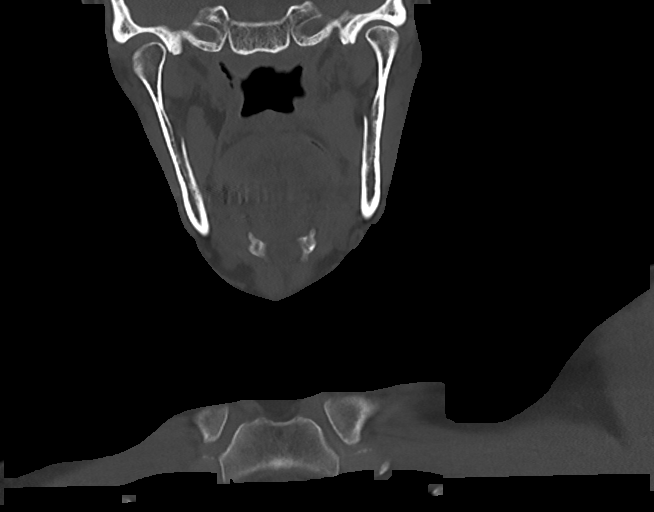
[im 73/122  bone]
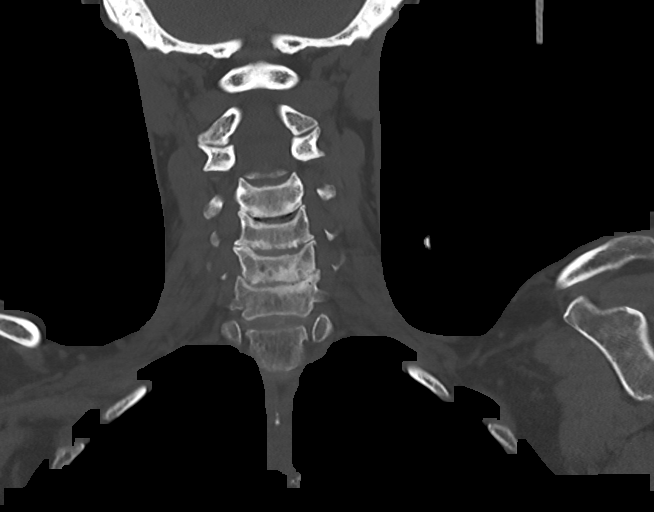

[Series 11: orthogonal axials · axial · 0.21mm/px · z∈[+884,+1004]mm · 5 of 101 slices shown, 7 images]
[im 17/101  soft-tissue]
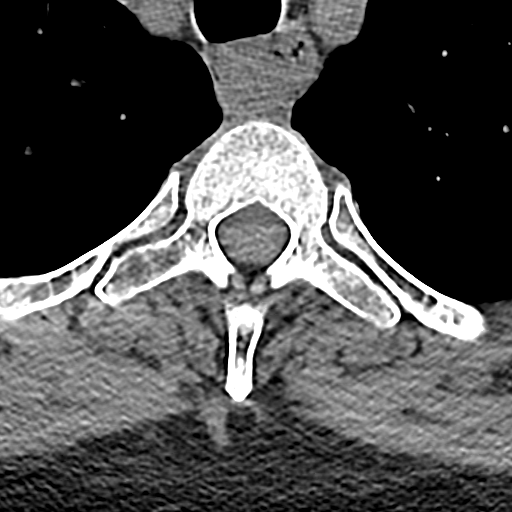
[im 17/101  bone]
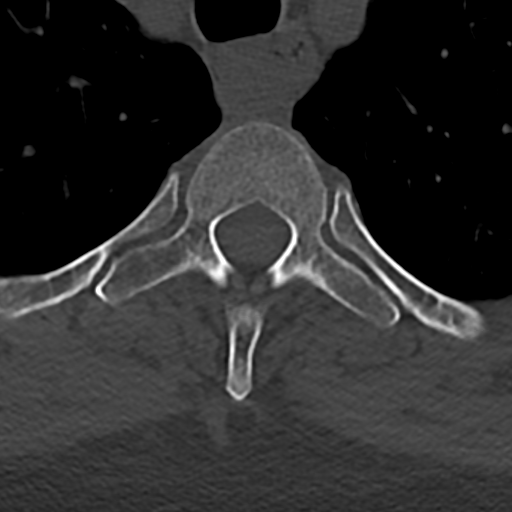
[im 34/101  bone]
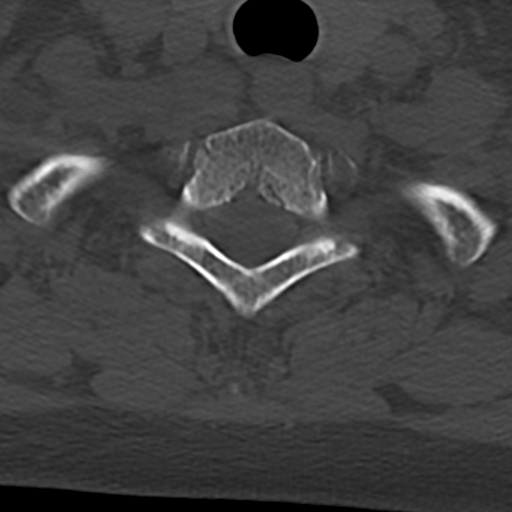
[im 51/101  bone]
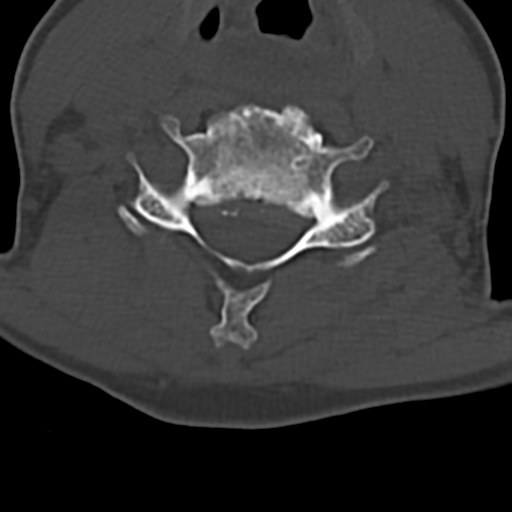
[im 67/101  bone]
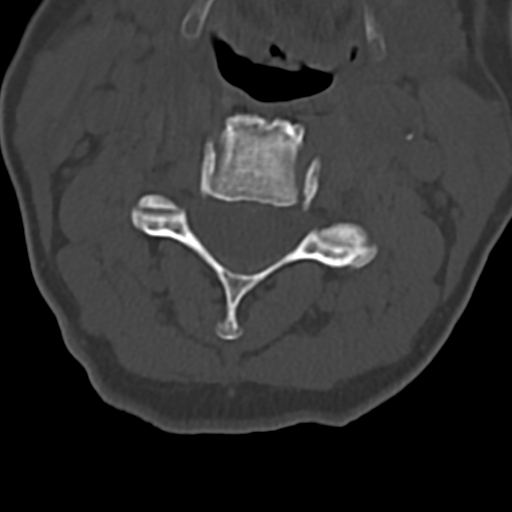
[im 84/101  soft-tissue]
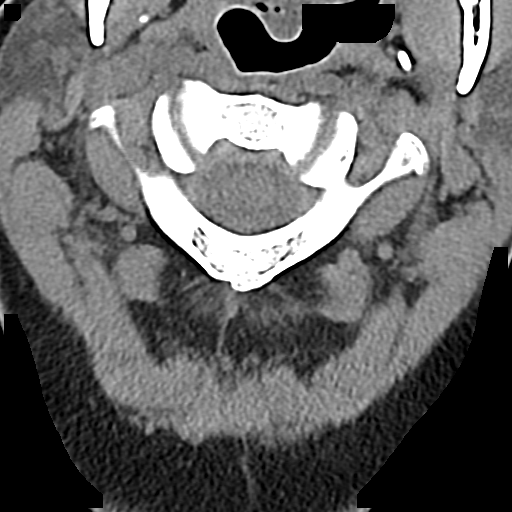
[im 84/101  bone]
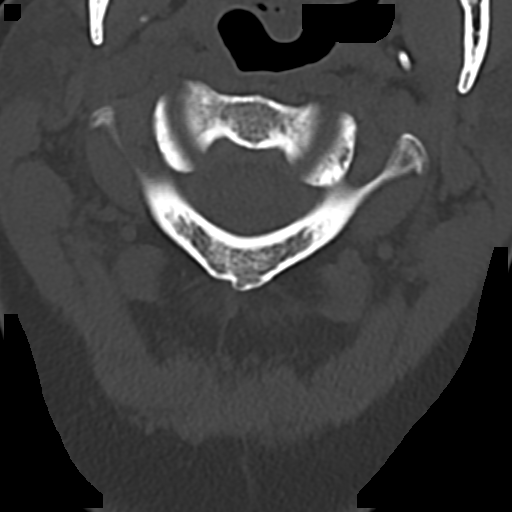

[15 of 33 positions shown; findings below may reference images not displayed]

FINDINGS: Alignment: No acute subluxation. There is reversal of normal
cervical lordosis which may be positional or due to muscle spasm or
secondary to degenerative changes.

Skull base and vertebrae: No acute fracture.

Soft tissues and spinal canal: No prevertebral fluid or swelling. No
visible canal hematoma.

Disc levels: Extensive multilevel degenerative changes with endplate
irregularity and disc space narrowing.

Upper chest: Negative.

Other: Atherosclerotic calcification of the visualized aortic arch.
IMPRESSION: 1. No acute/traumatic cervical spine pathology.
2. Extensive multilevel degenerative changes.

## 2020-09-30 IMAGING — DX DG SHOULDER 2+V*L*
3 series · 3 of 3 positions shown · non-contrast
Comparison: None.

CLINICAL DATA: Fall with shoulder pain

EXAM:
LEFT SHOULDER - 2+ VIEW

[shoulder grashey]
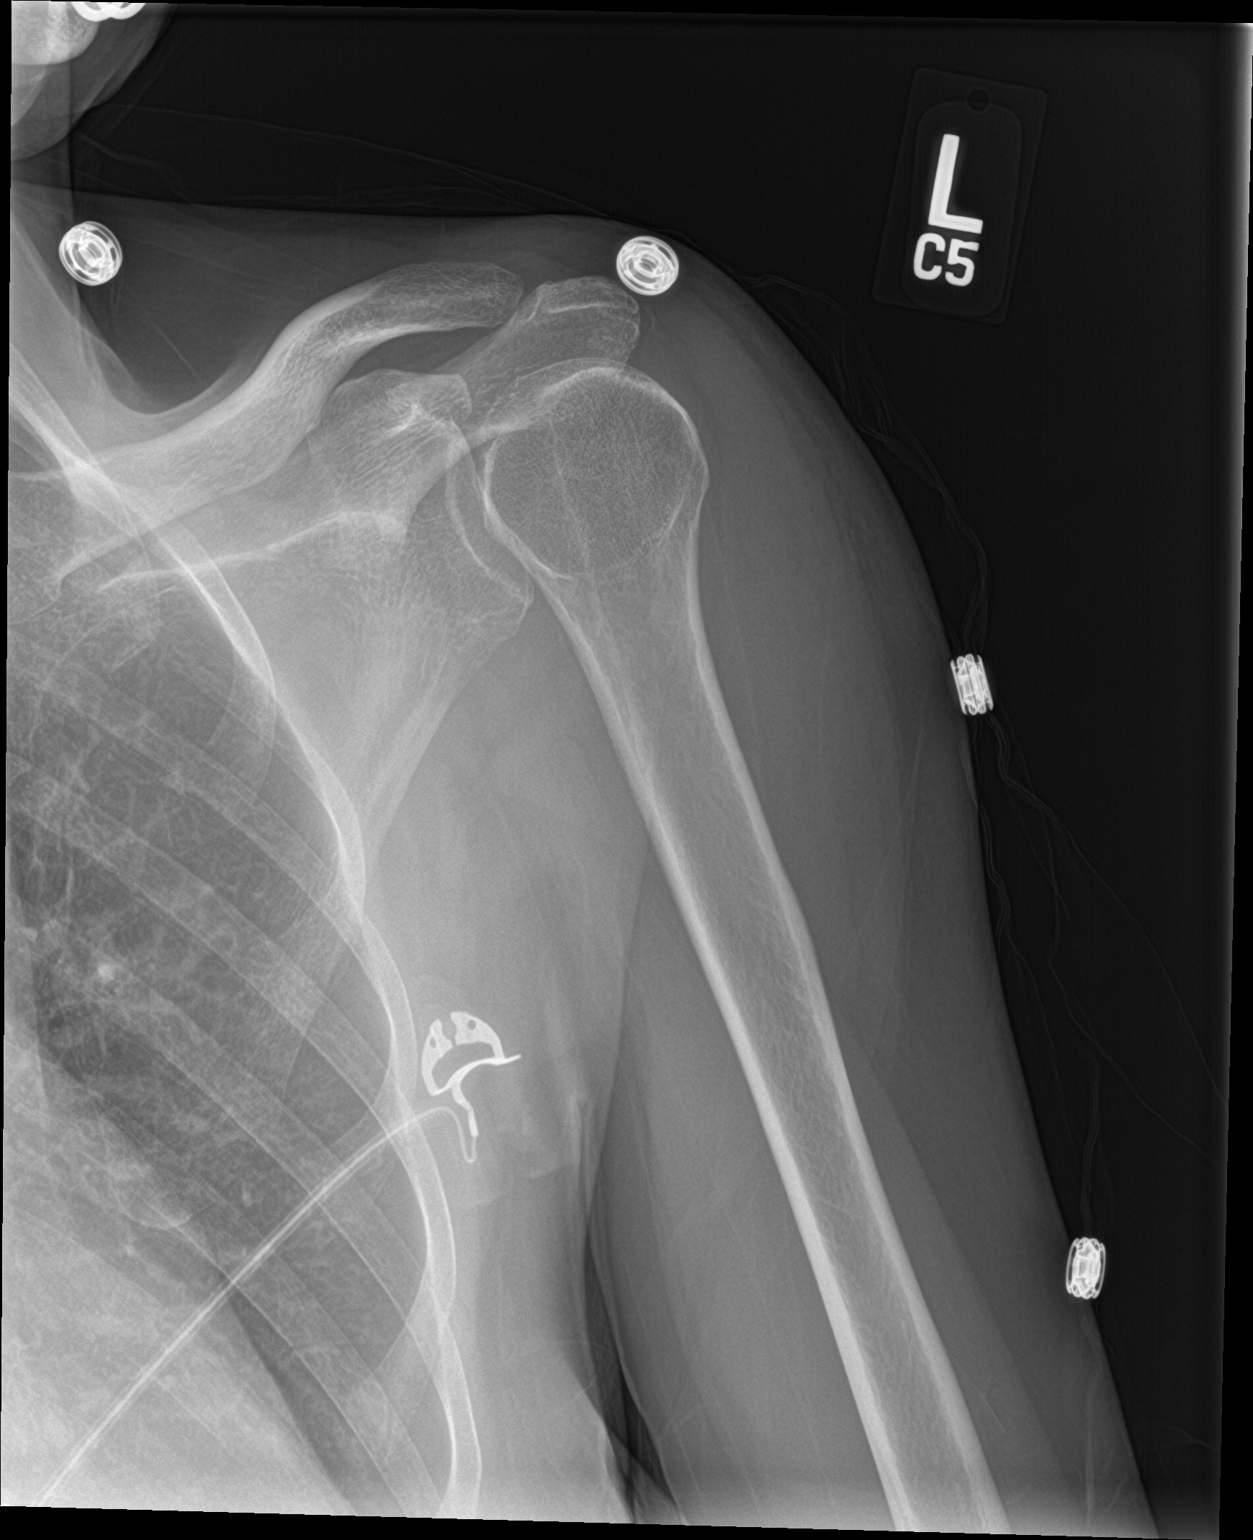

[shoulder y view]
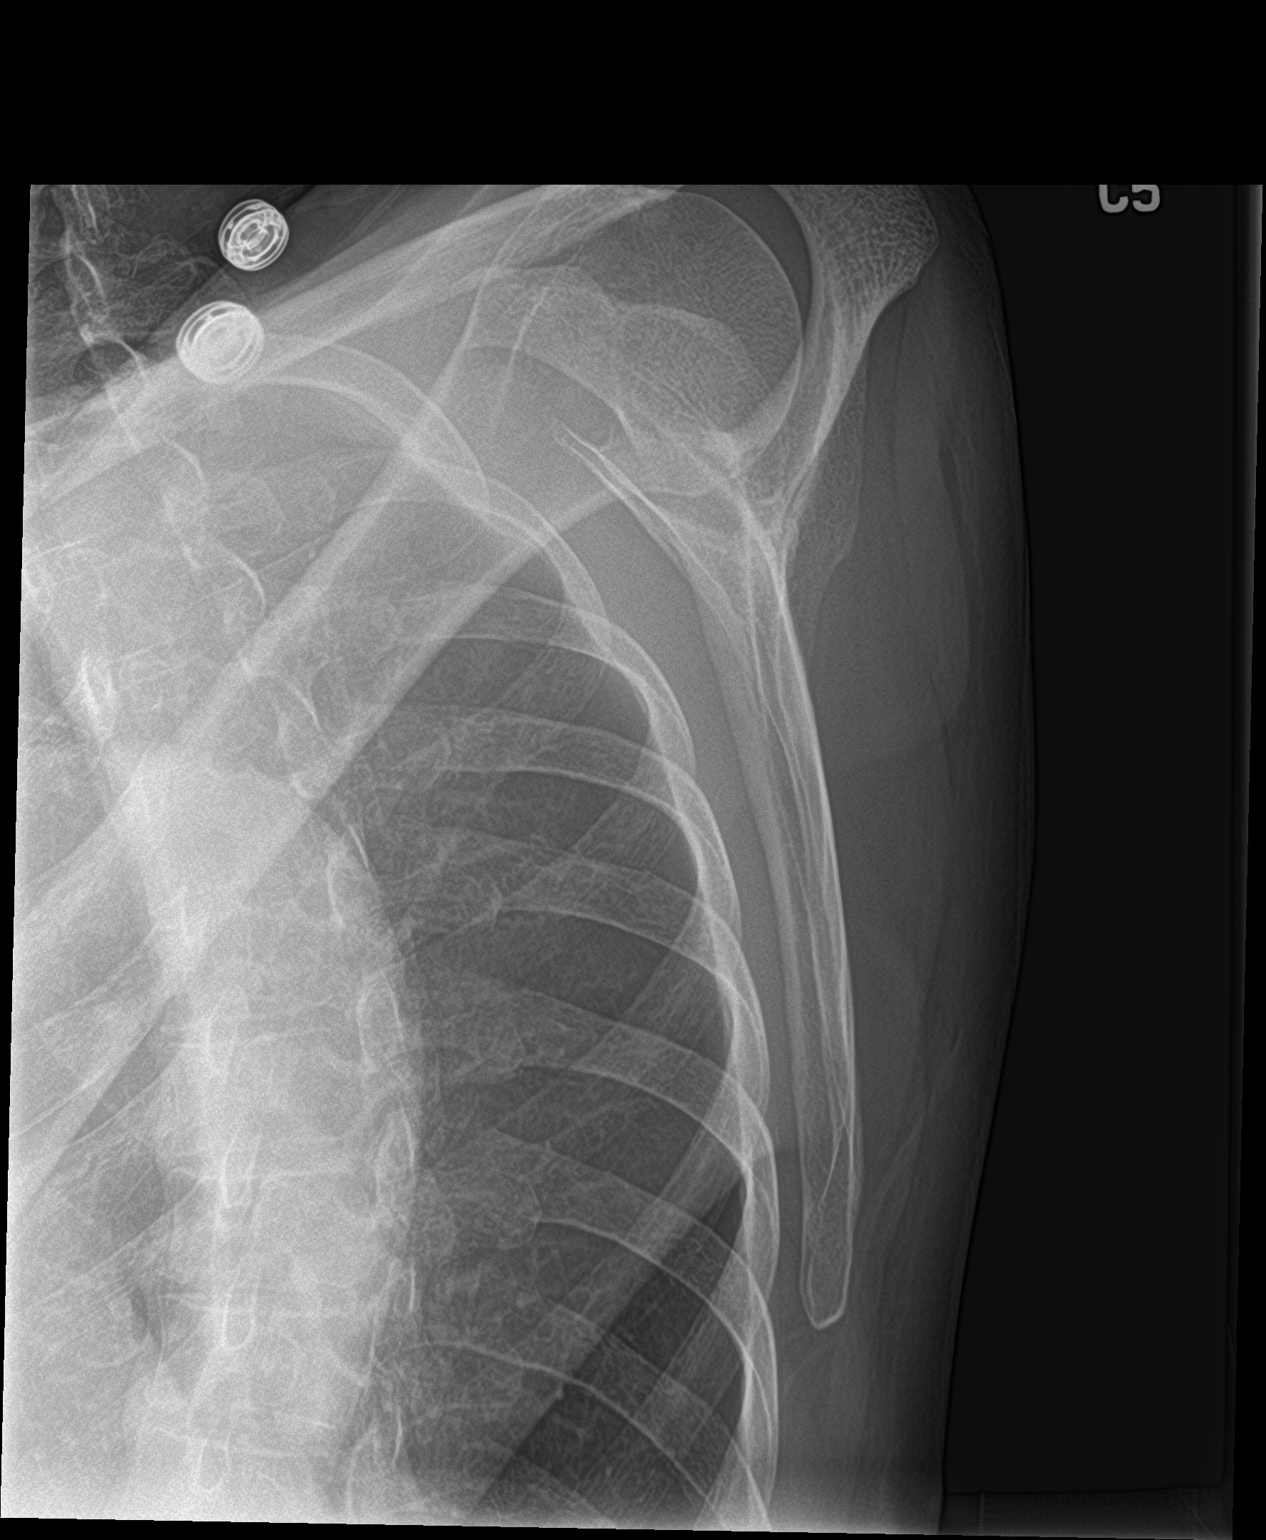

[shoulder ap neutral]
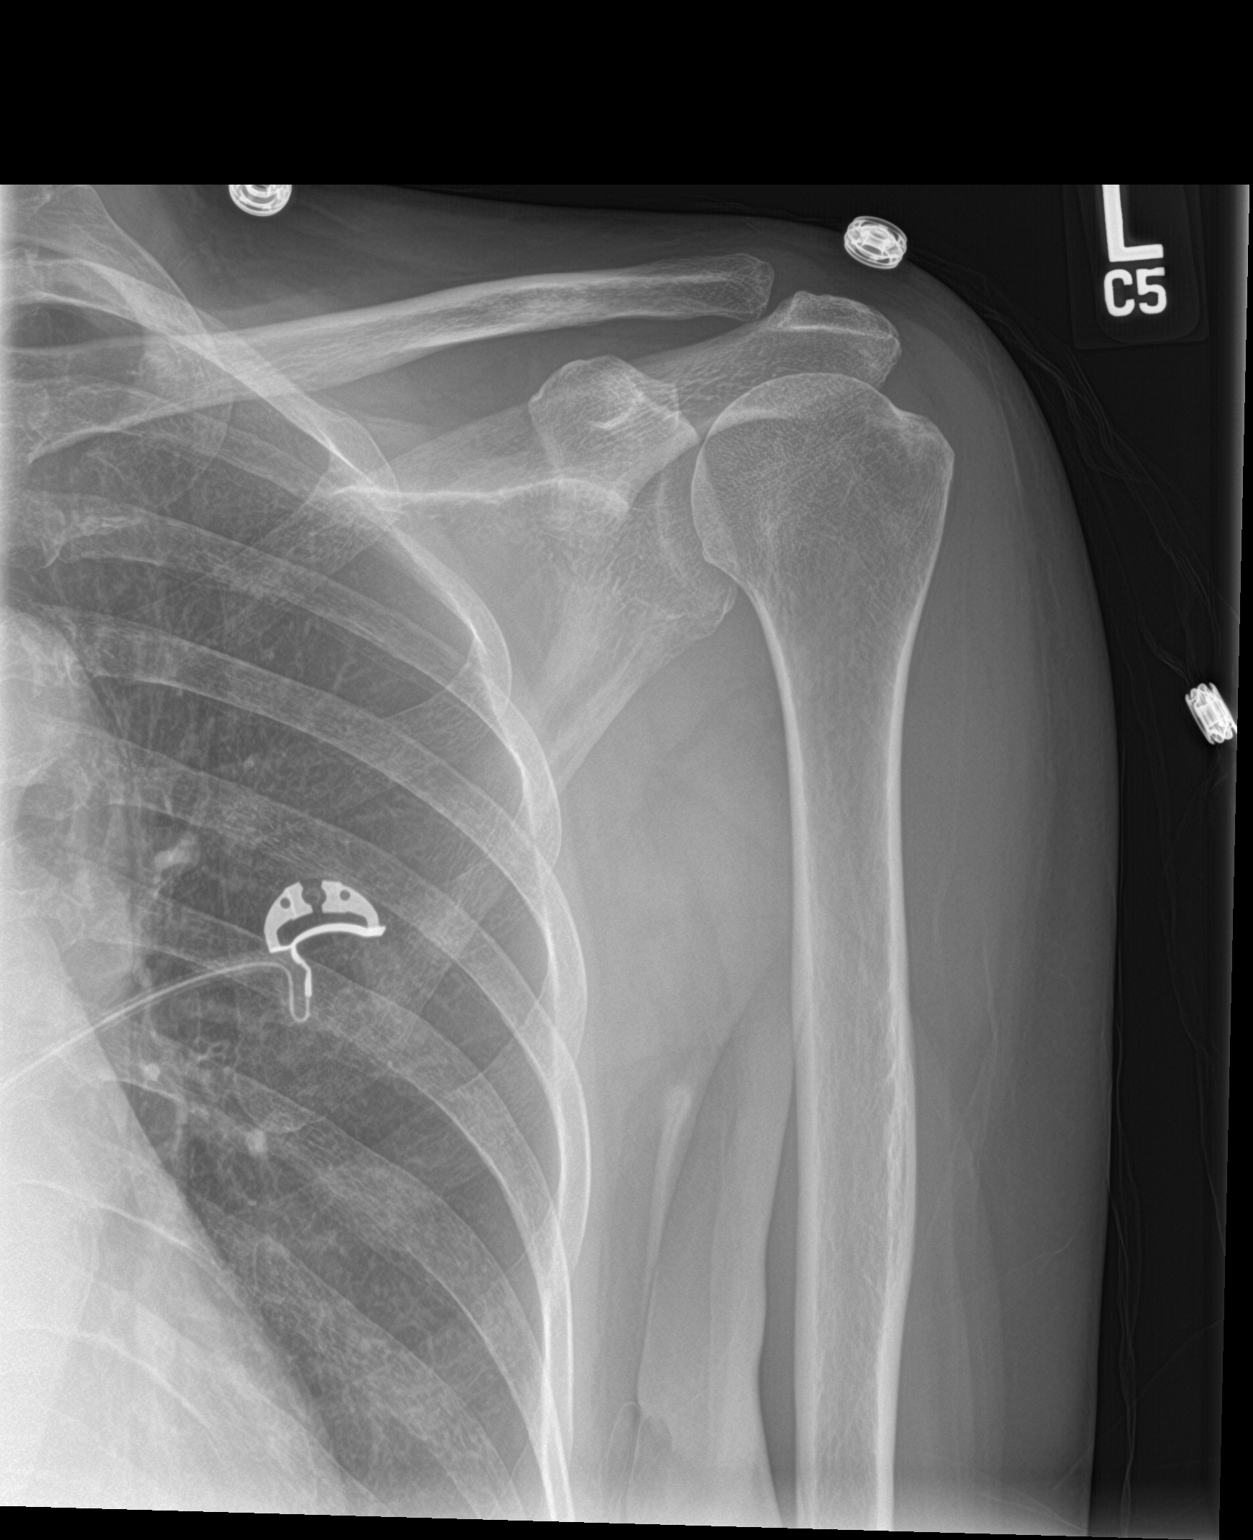

[3 of 3 positions shown; findings below may reference images not displayed]

FINDINGS: There is no evidence of fracture or dislocation. There is no
evidence of arthropathy or other focal bone abnormality. Soft
tissues are unremarkable.
IMPRESSION: Negative.

## 2020-09-30 IMAGING — DX DG HIP (WITH OR WITHOUT PELVIS) 2-3V*L*
3 series · 3 of 3 positions shown · non-contrast
Comparison: None.

CLINICAL DATA: Fall with hip pain

EXAM:
DG HIP (WITH OR WITHOUT PELVIS) 2-3V LEFT

[pelvis ap]
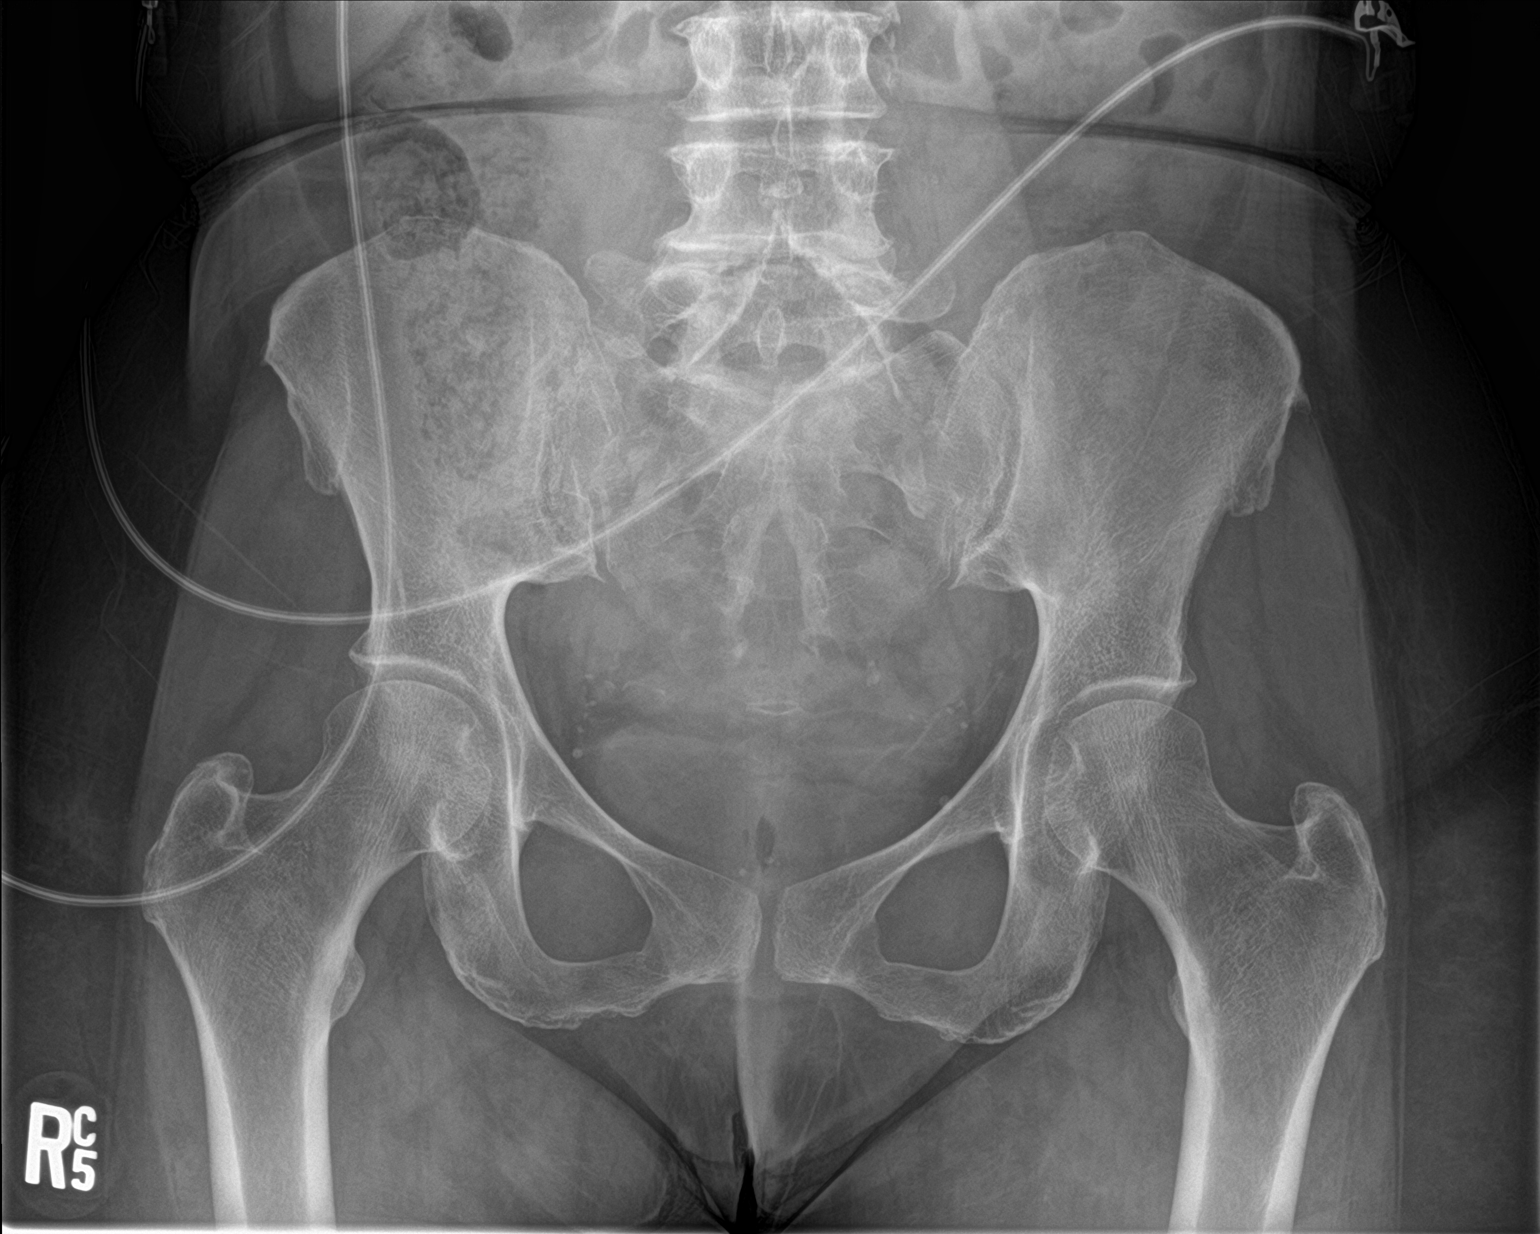

[hip ap]
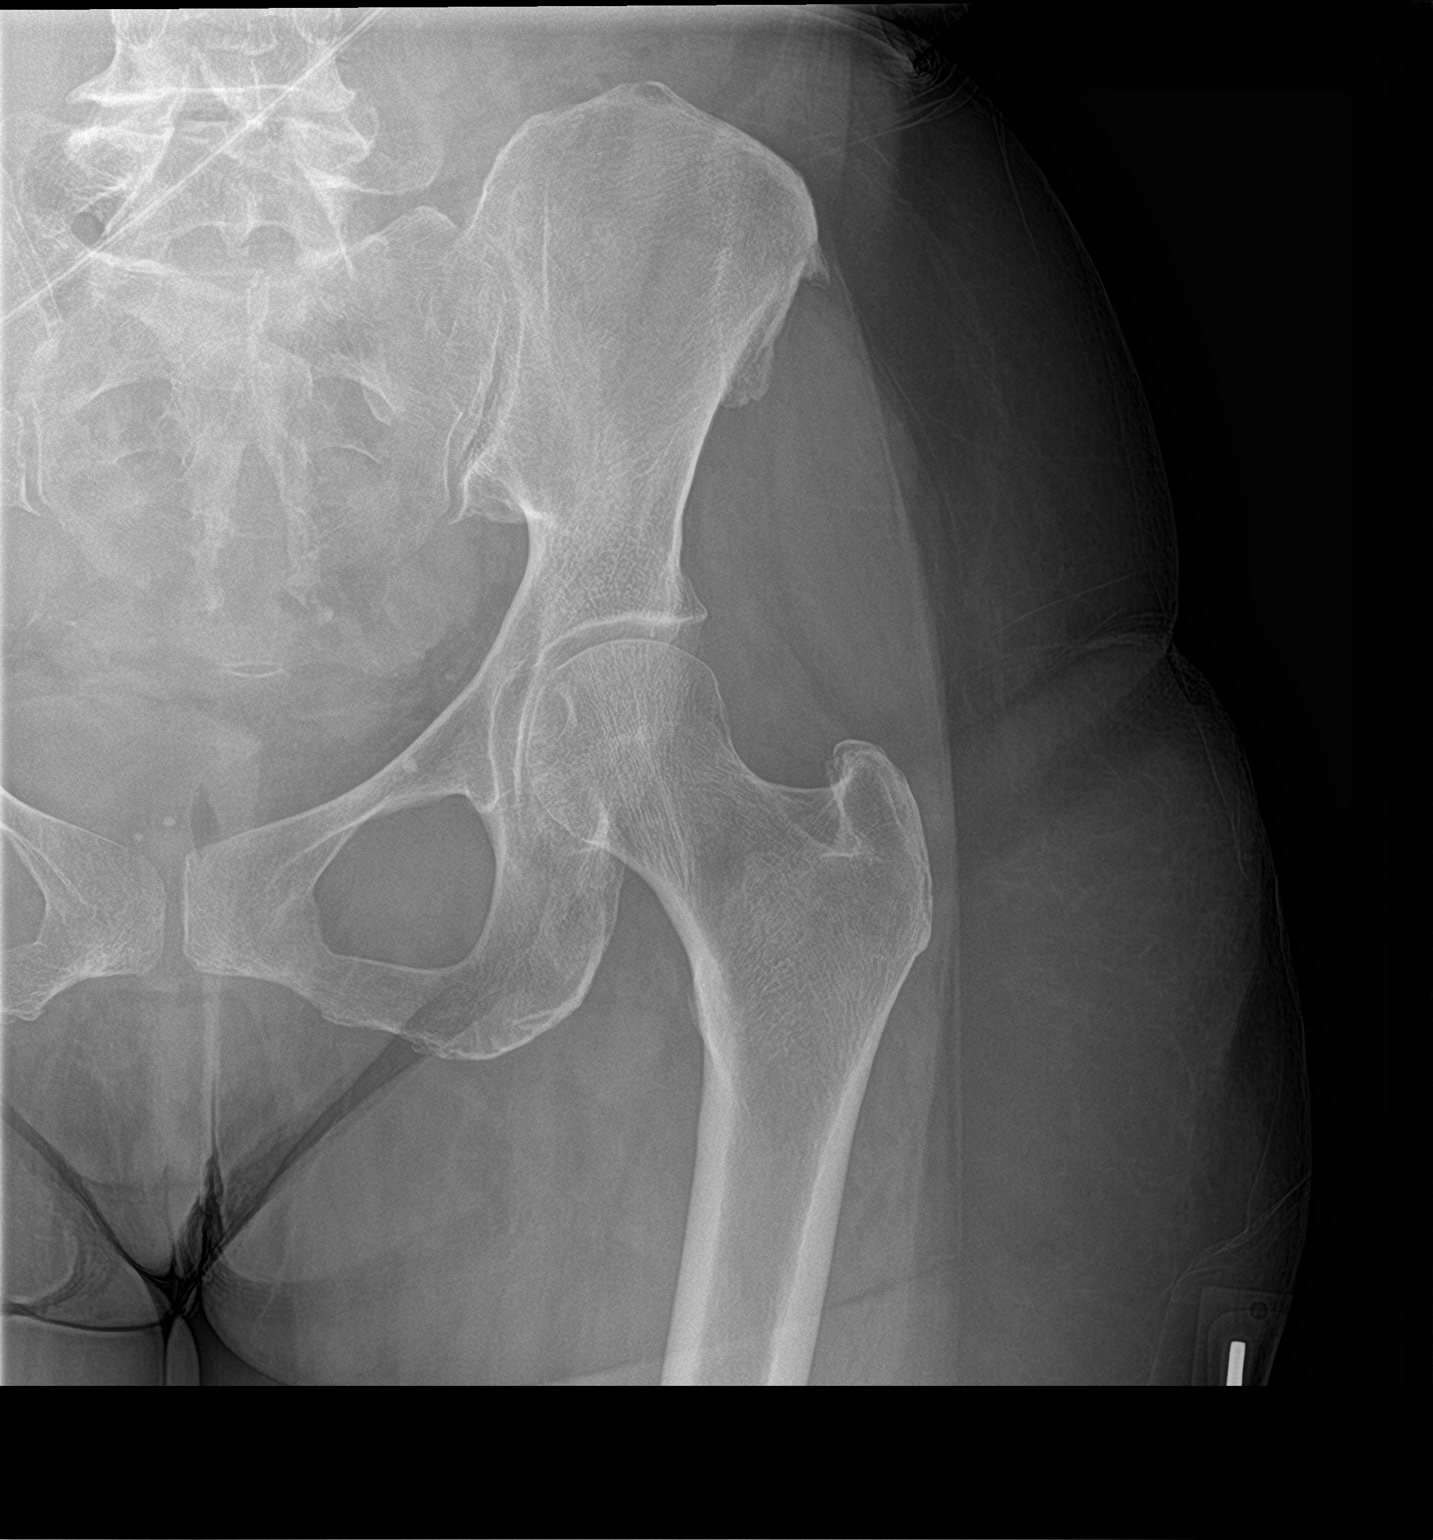

[hip lat]
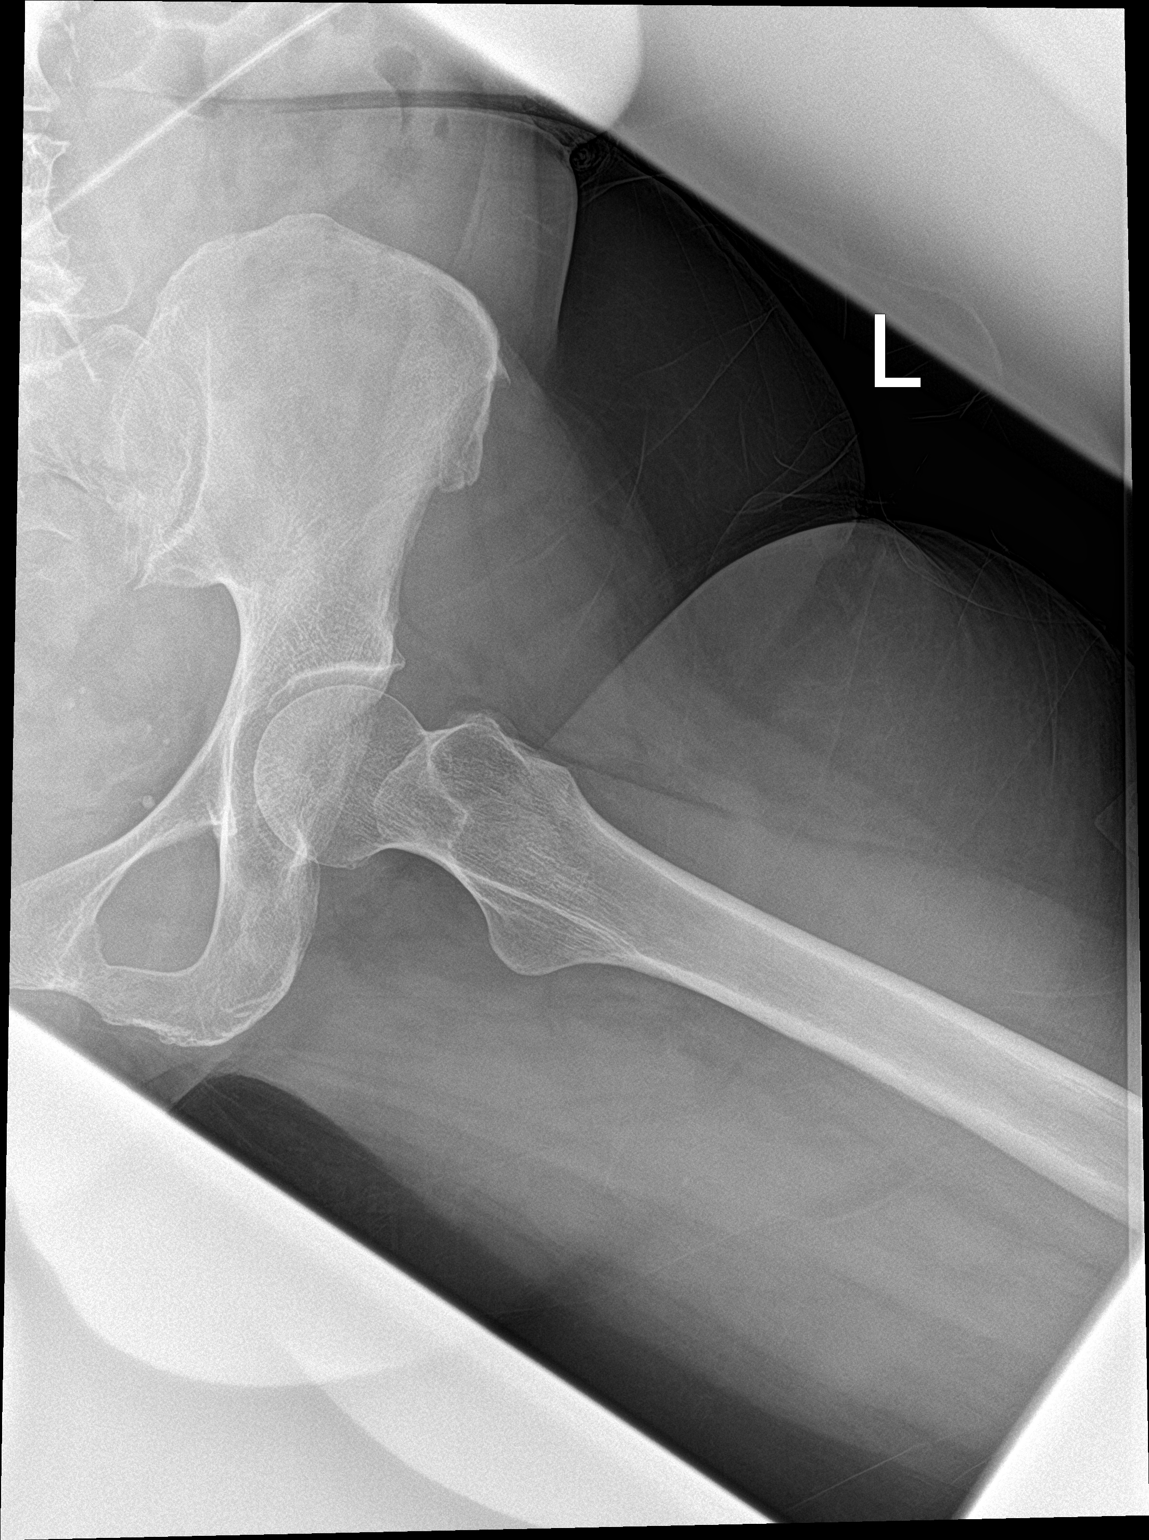

[3 of 3 positions shown; findings below may reference images not displayed]

FINDINGS: There is no evidence of hip fracture or dislocation. There is no
evidence of arthropathy or other focal bone abnormality.
IMPRESSION: Negative.

## 2020-09-30 IMAGING — DX DG KNEE COMPLETE 4+V*L*
4 series · 4 of 4 positions shown · non-contrast
Comparison: None.

CLINICAL DATA: Fall

EXAM:
LEFT KNEE - COMPLETE 4+ VIEW

[knee ap]
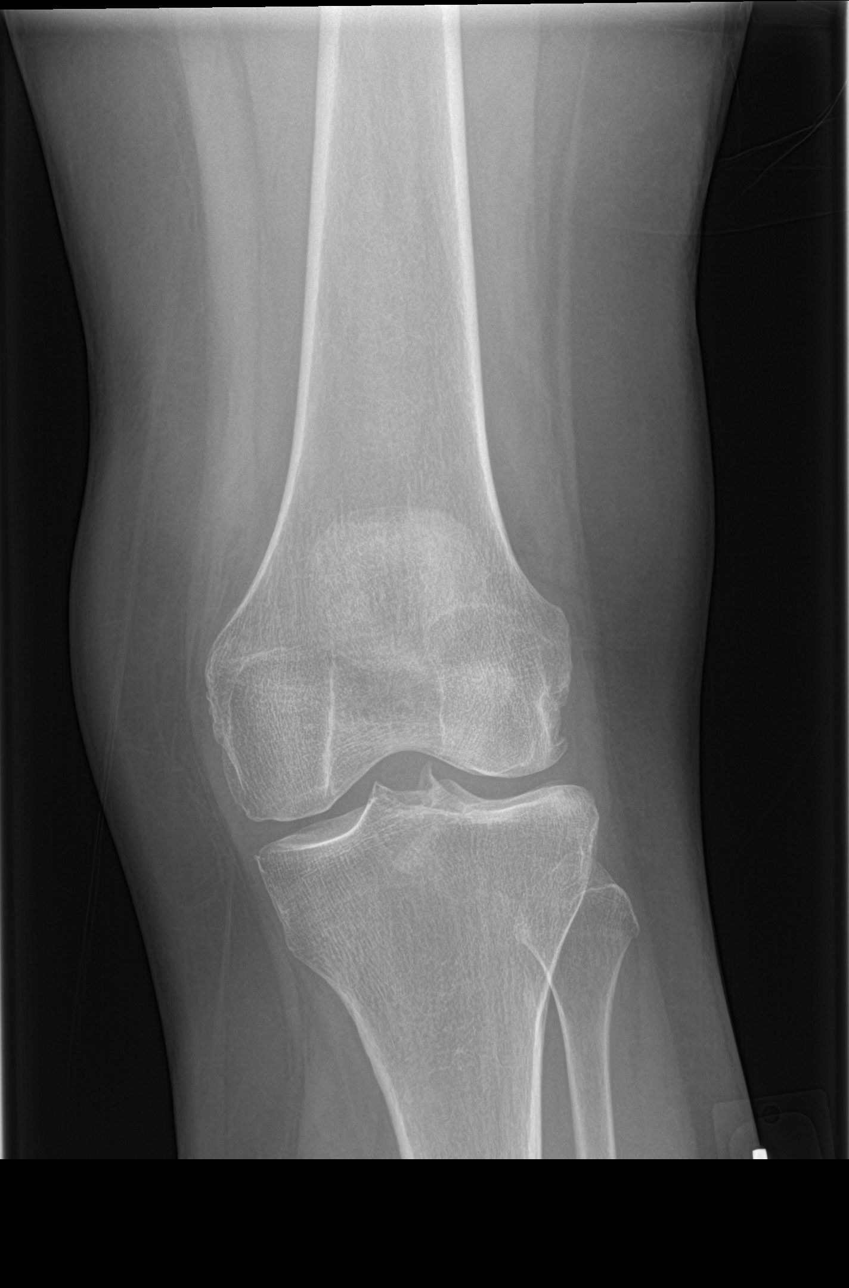

[knee lat]
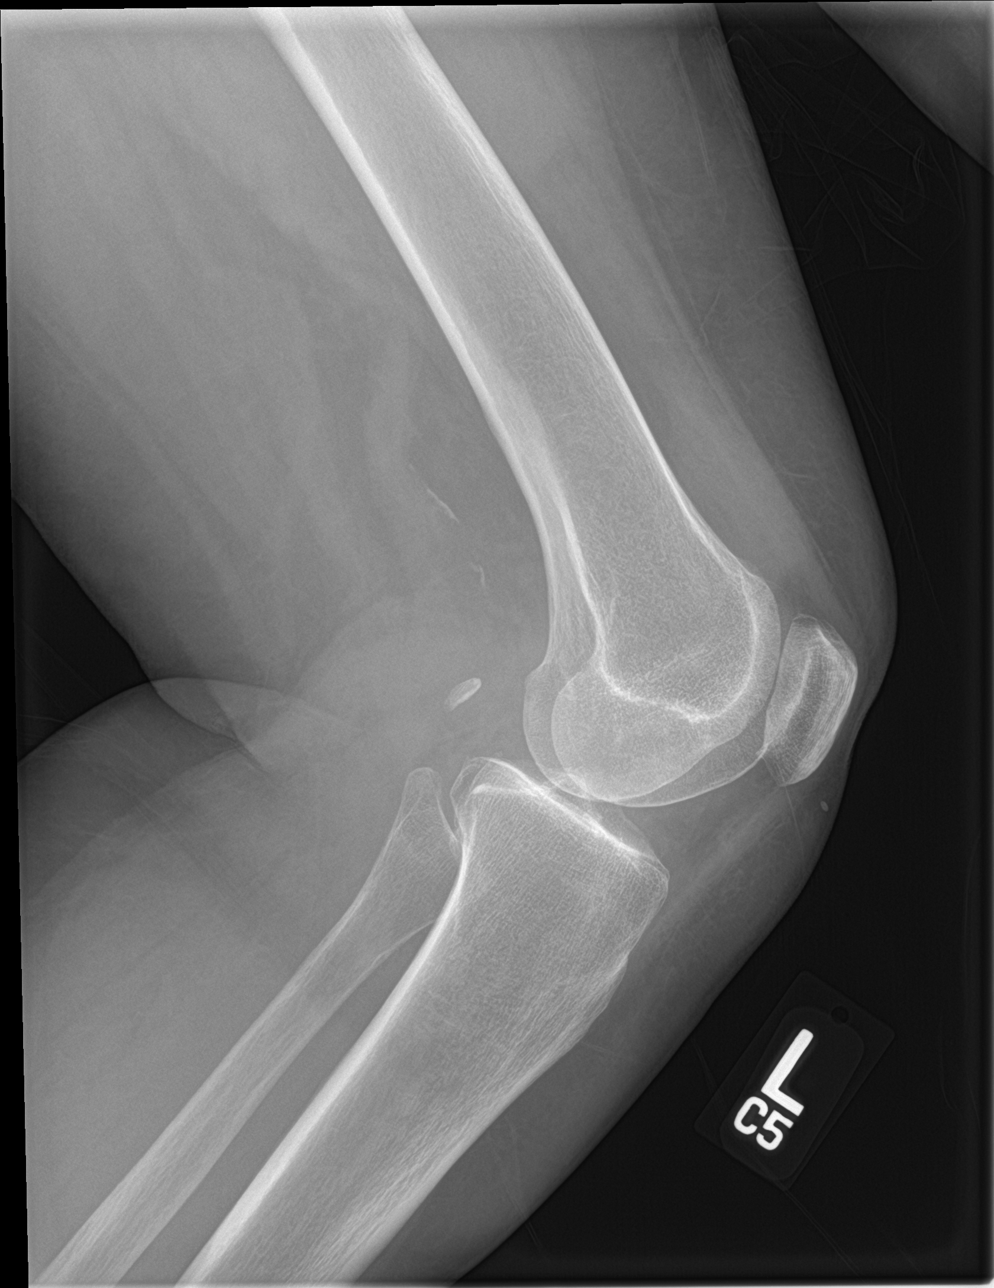

[knee obl (1 of 2)]
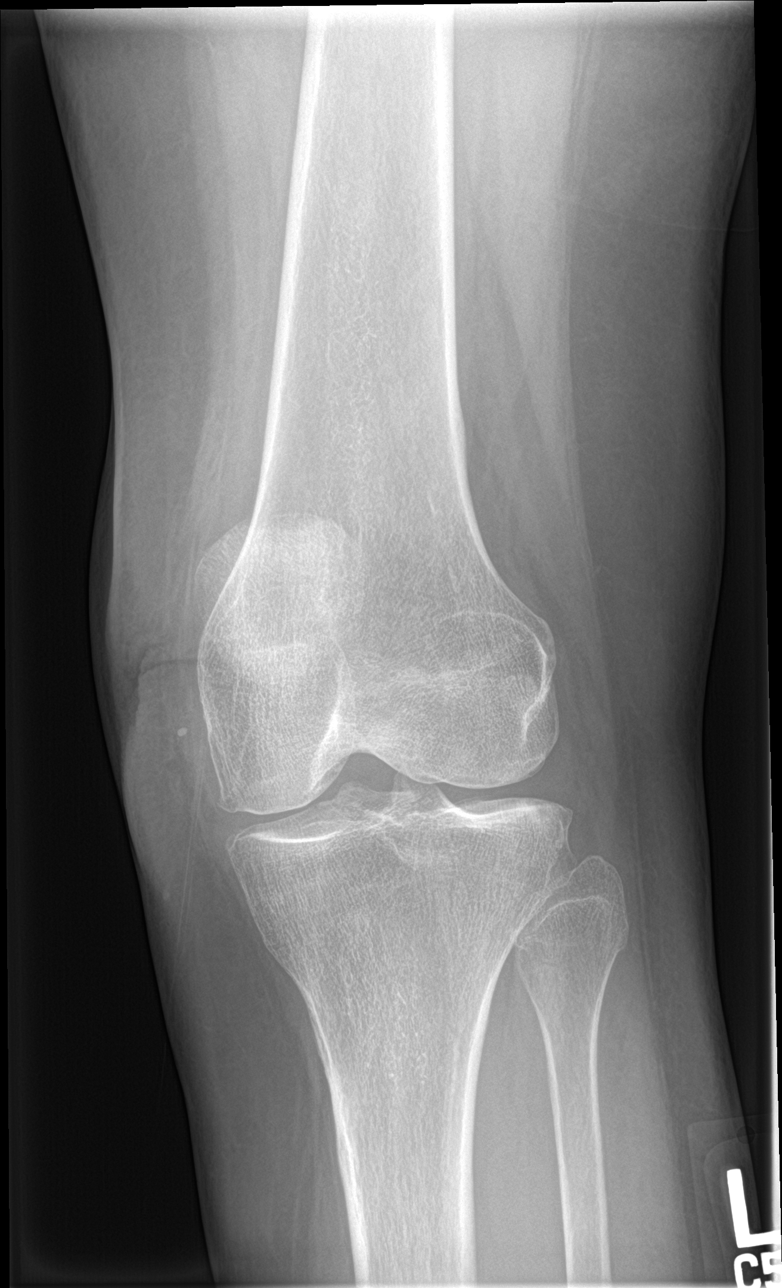

[knee obl (2 of 2)]
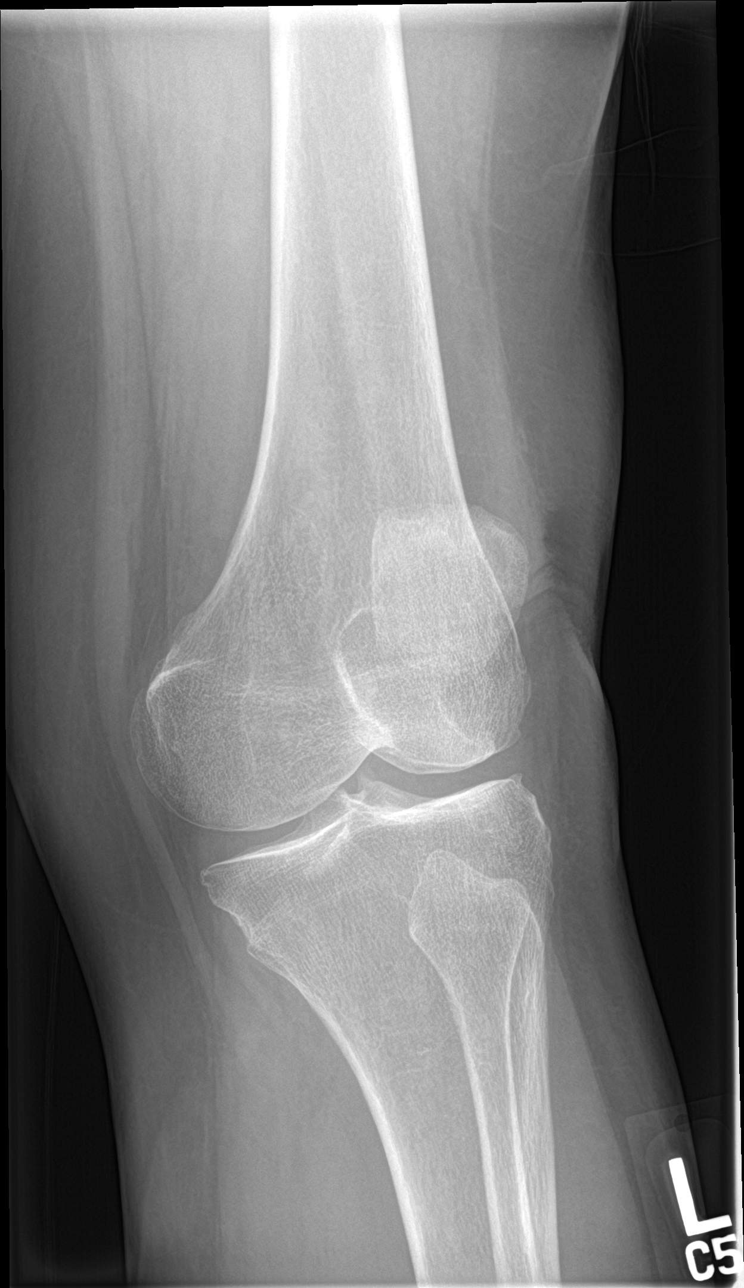

[4 of 4 positions shown; findings below may reference images not displayed]

FINDINGS: No fracture or dislocation of the left knee. There is mild lateral
compartment joint space loss and osteophytosis with otherwise
preserved joint spaces. There is a moderate, nonspecific knee joint
effusion. Vascular calcinosis.
IMPRESSION: 1. No fracture or dislocation of the left knee. There is mild
lateral compartment joint space loss and osteophytosis with
otherwise preserved joint spaces.

2.  There is a moderate, nonspecific knee joint effusion.

## 2020-09-30 IMAGING — CT CT HEAD W/O CM
4 series · 16 of 47 positions shown, 18 images · non-contrast
Comparison: May 20, 2019

CLINICAL DATA: Ataxia following fall

EXAM:
CT HEAD WITHOUT CONTRAST
TECHNIQUE: Contiguous axial images were obtained from the base of the skull
through the vertex without intravenous contrast.

[Series 3: head without · axial · non-contrast · 0.43mm/px · z∈[-69,+46]mm · 7 of 31 slices shown, 9 images]
[im 4/31  brain]
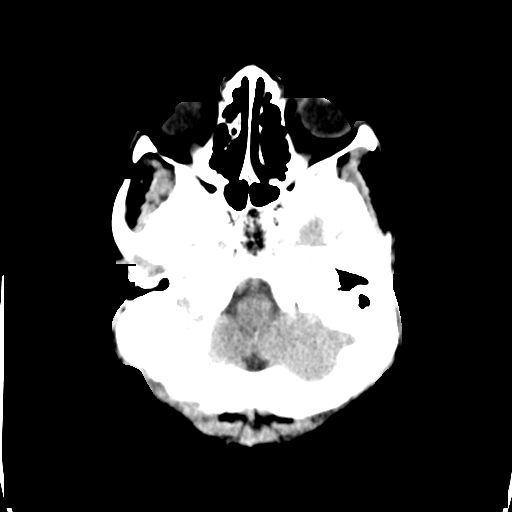
[im 4/31  bone]
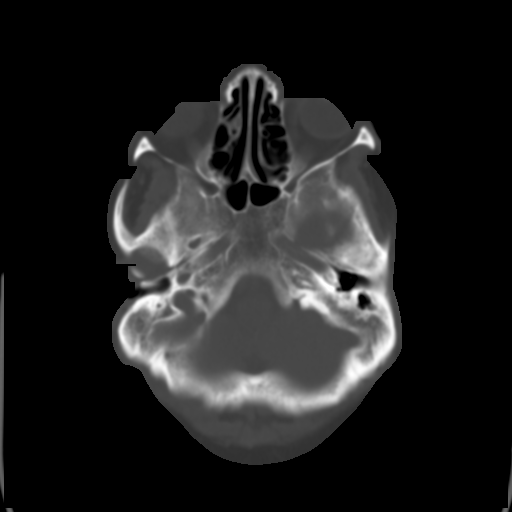
[im 8/31  brain]
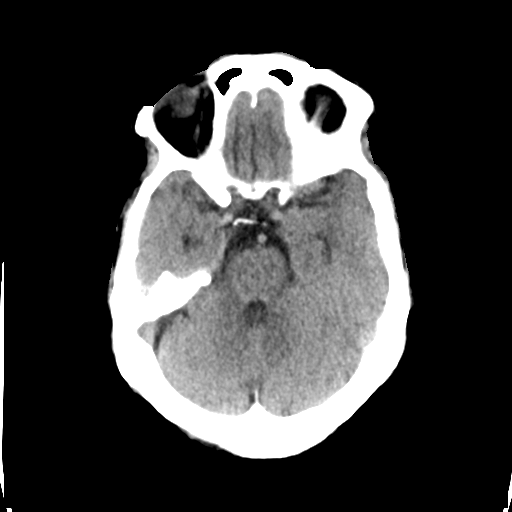
[im 12/31  brain]
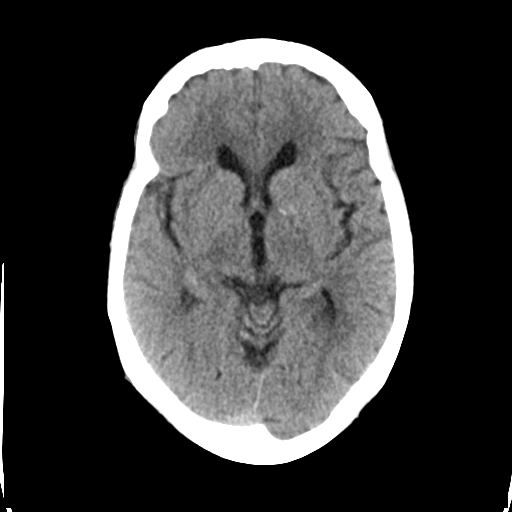
[im 16/31  brain]
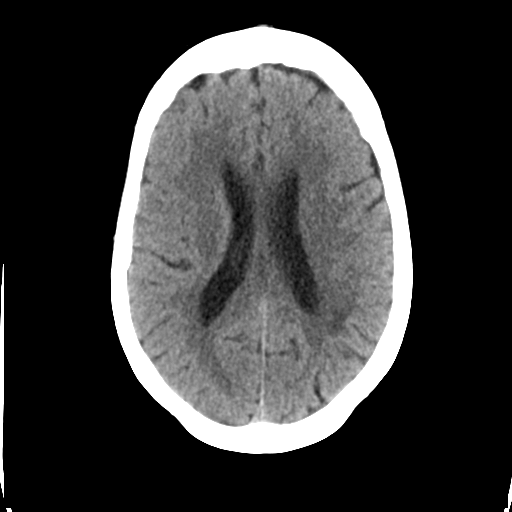
[im 19/31  brain]
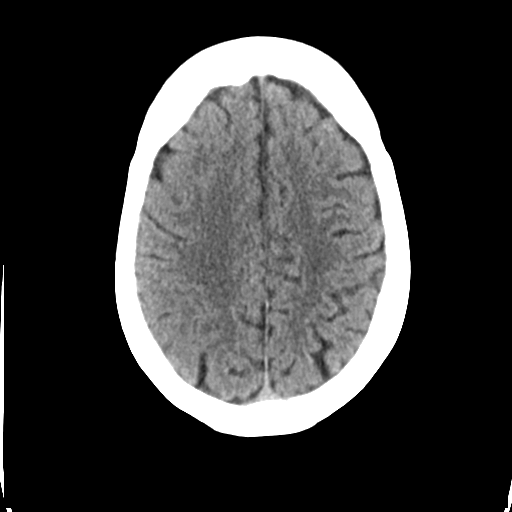
[im 19/31  bone]
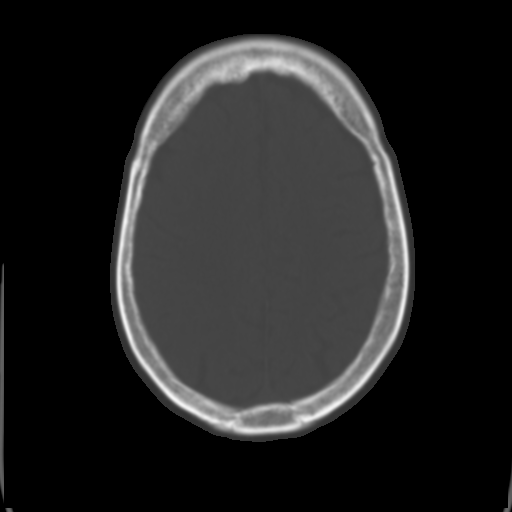
[im 23/31  brain]
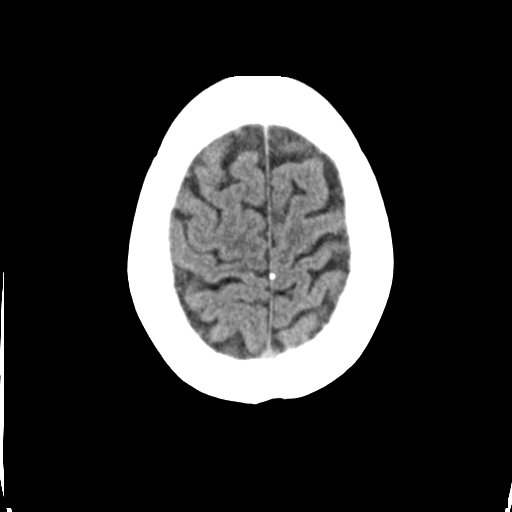
[im 27/31  brain]
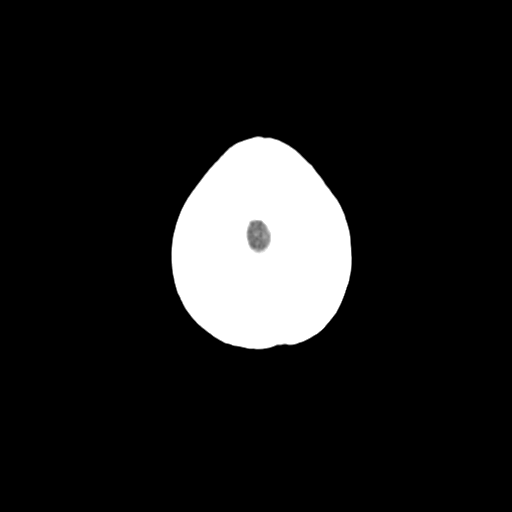

[Series 4: head bone · axial · 0.43mm/px · z∈[-70,-40]mm · 3 of 76 slices shown]
[im 8/76  bone]
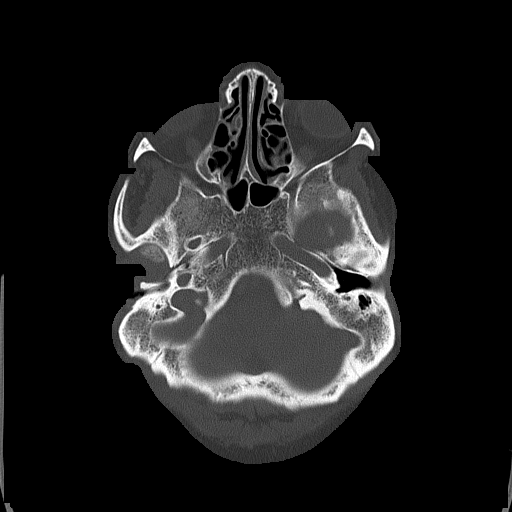
[im 16/76  bone]
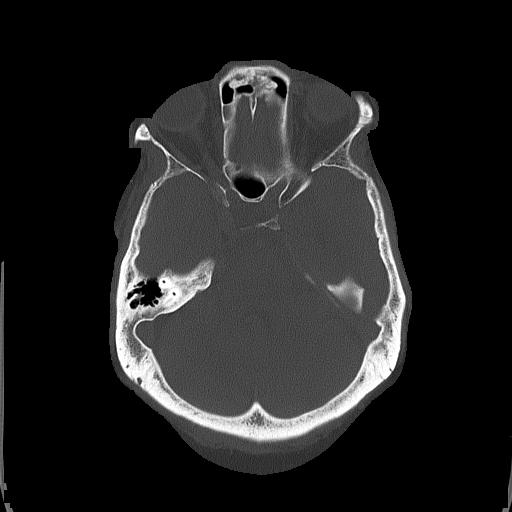
[im 23/76  bone]
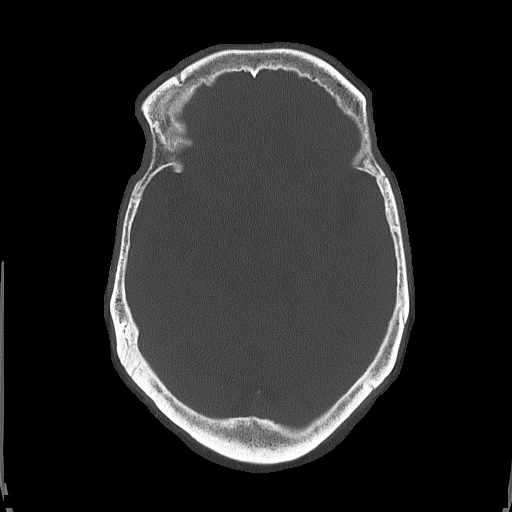

[Series 5: head without cor · coronal · non-contrast · 0.31mm/px · 3 of 73 slices shown]
[im 25/73  brain]
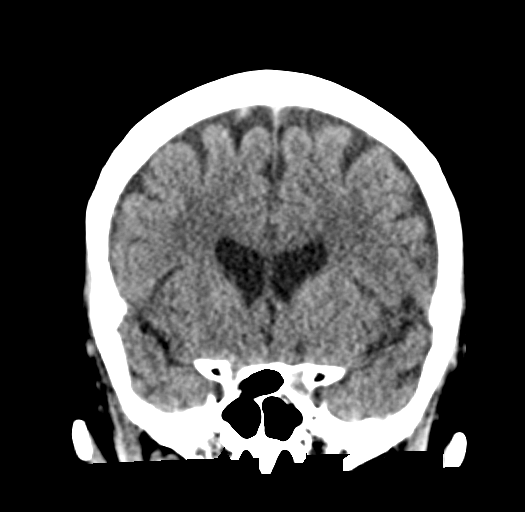
[im 33/73  brain]
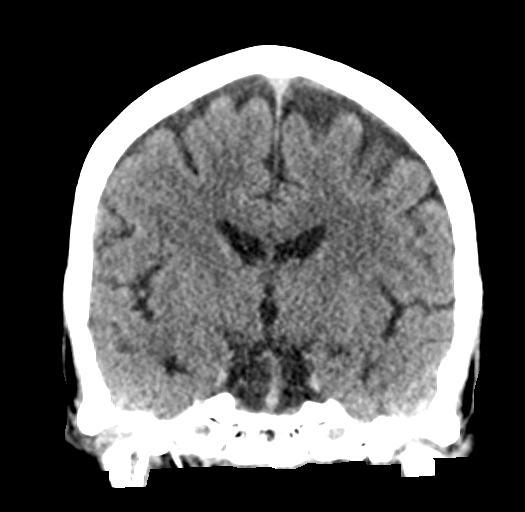
[im 41/73  brain]
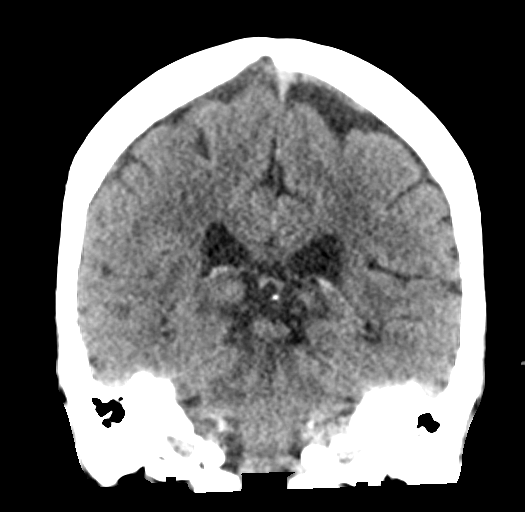

[Series 6: head without sag · sagittal · non-contrast · 0.31mm/px · 3 of 55 slices shown]
[im 19/55  brain]
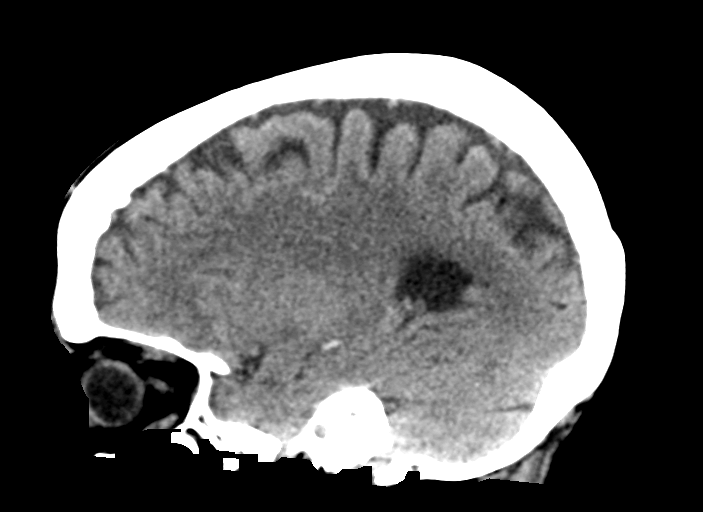
[im 28/55  brain]
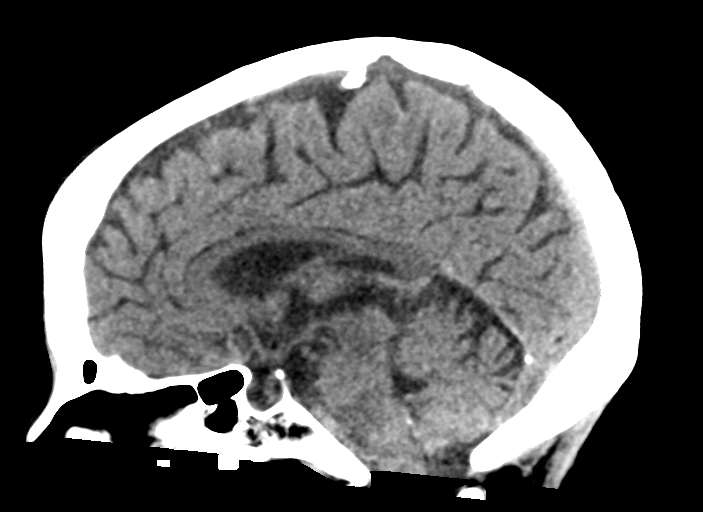
[im 37/55  brain]
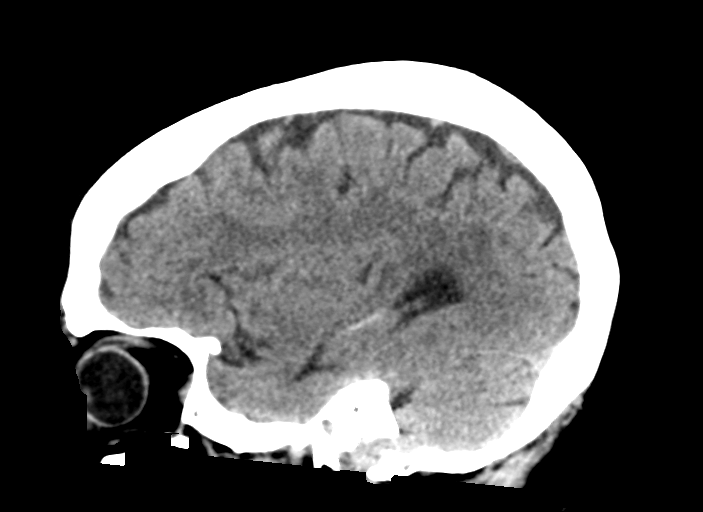

[16 of 47 positions shown; findings below may reference images not displayed]

FINDINGS: Brain: There is age related volume loss. There is no intracranial
mass, hemorrhage, extra-axial fluid collection, or midline shift.
There is slight small vessel disease in the centra semiovale
bilaterally, stable. Elsewhere the brain parenchyma appears
unremarkable. There is no demonstrable acute infarct.

Vascular: There is no hyperdense vessel. There is no appreciable
vascular calcification.

Skull: The bony calvarium appears intact.

Sinuses/Orbits: There is opacification in several ethmoid air cells.
Orbits appear symmetric bilaterally.

Other: Mastoid air cells are clear.
IMPRESSION: Age related volume loss with mild periventricular small vessel
disease. No acute infarct. No mass or hemorrhage.

There is opacification in several ethmoid air cells.

## 2020-09-30 NOTE — Telephone Encounter (Signed)
Call from pt - stated she received info about GI which I left on her vm. States she having abd pain and gas for about 2 weeks.  I will ask Dr Jimmye Norman if pt needs to schedule an appt to be seen here or can she order  a GI referral ? Thanks

## 2020-09-30 NOTE — Telephone Encounter (Signed)
Return pt's call - no answer; left message on self identified vm per her chart she saw [facility] [phone]; and call for any questions.

## 2020-09-30 NOTE — Telephone Encounter (Addendum)
Pt contacted GI herself and was given  appt time below Next Appt w/[facility]  With Gastroenterology 10/17/2020 at 2:30 PM  Still need appt to be seen in [facility]? Please advise

## 2020-09-30 NOTE — Telephone Encounter (Signed)
She doesn't need an appt to be seen here as long as she feels that the GI appt meets her needs.  Thank you!

## 2020-09-30 NOTE — Telephone Encounter (Signed)
Pt is wanting to know GI office she went to in the past. pls contact [phone]

## 2020-10-01 ENCOUNTER — Telehealth: Payer: Self-pay

## 2020-10-01 NOTE — Telephone Encounter (Signed)
Please advise her to stop taking the medicine.  I'm not sure if it is responsible for her symptoms, but it is safe for her to stop and wait until we get together again to discuss further.

## 2020-10-01 NOTE — Telephone Encounter (Signed)
The name of the medicine  Is atorvastatin (LIPITOR) 20 MG tablet

## 2020-10-01 NOTE — Telephone Encounter (Signed)
Pt notified, is requesting appt w/ Dr. Jimmye Norman to discuss.  Appt made for 10/09/20 @ 0845 w/ PCP. SChaplin, RN,BSN

## 2020-10-01 NOTE — Telephone Encounter (Signed)
Received 3rd TC from patient in 10 minutes.  She states she was prescribed atorvastatin on 09/18/20.  Her pharmacist told her it could cause bad body aches, which she is now complaining of, mainly in her legs. She wants to know if there is another drug for her cholesterol that Dr. Jimmye Norman would call in for her as she doesn't want to take this one anymore. SChaplin, RN,BSN

## 2020-10-01 NOTE — Telephone Encounter (Signed)
Pt is requesting a call back she stated that the Dr gave her new medication and she is starting to have a reaction she thinks (  Pain in her lower left side and legs and body aches )

## 2020-10-03 ENCOUNTER — Encounter (HOSPITAL_COMMUNITY): Payer: Self-pay | Admitting: Emergency Medicine

## 2020-10-03 ENCOUNTER — Emergency Department (HOSPITAL_COMMUNITY): Payer: Medicare Other

## 2020-10-03 ENCOUNTER — Other Ambulatory Visit: Payer: Self-pay

## 2020-10-03 ENCOUNTER — Emergency Department (HOSPITAL_COMMUNITY)
Admission: EM | Admit: 2020-10-03 | Discharge: 2020-10-03 | Disposition: A | Discharge status: Home / Self Care | Payer: Medicare Other | Attending: Emergency Medicine | Admitting: Emergency Medicine

## 2020-10-03 DIAGNOSIS — J4520 Mild intermittent asthma, uncomplicated: Secondary | ICD-10-CM | POA: Insufficient documentation

## 2020-10-03 DIAGNOSIS — Z79899 Other long term (current) drug therapy: Secondary | ICD-10-CM | POA: Insufficient documentation

## 2020-10-03 DIAGNOSIS — I10 Essential (primary) hypertension: Secondary | ICD-10-CM | POA: Insufficient documentation

## 2020-10-03 DIAGNOSIS — Z87891 Personal history of nicotine dependence: Secondary | ICD-10-CM | POA: Diagnosis not present

## 2020-10-03 DIAGNOSIS — M5442 Lumbago with sciatica, left side: Secondary | ICD-10-CM | POA: Insufficient documentation

## 2020-10-03 DIAGNOSIS — Z7901 Long term (current) use of anticoagulants: Secondary | ICD-10-CM | POA: Diagnosis not present

## 2020-10-03 DIAGNOSIS — Z7951 Long term (current) use of inhaled steroids: Secondary | ICD-10-CM | POA: Insufficient documentation

## 2020-10-03 DIAGNOSIS — M545 Low back pain, unspecified: Secondary | ICD-10-CM | POA: Diagnosis not present

## 2020-10-03 LAB — URINALYSIS, ROUTINE W REFLEX MICROSCOPIC
Bacteria, UA: NONE SEEN
Bilirubin Urine: NEGATIVE
Glucose, UA: NEGATIVE mg/dL
Ketones, ur: NEGATIVE mg/dL
Leukocytes,Ua: NEGATIVE
Nitrite: NEGATIVE
Protein, ur: NEGATIVE mg/dL
Specific Gravity, Urine: 1.006 (ref 1.005–1.030)
pH: 8 (ref 5.0–8.0)

## 2020-10-03 MED ORDER — HYDROCODONE-ACETAMINOPHEN 5-325 MG PO TABS
1.0000 | ORAL_TABLET | Freq: Once | ORAL | Status: AC
  Administered 2020-10-03: 1 via ORAL
  Filled 2020-10-03: qty 1

## 2020-10-03 MED ORDER — GABAPENTIN 100 MG PO CAPS: 100.0000 mg | ORAL_CAPSULE | Freq: Three times a day (TID) | ORAL | 0 refills | Status: DC

## 2020-10-03 MED ORDER — CYCLOBENZAPRINE HCL 10 MG PO TABS: 10.0000 mg | ORAL_TABLET | Freq: Two times a day (BID) | ORAL | 0 refills | Status: DC | PRN

## 2020-10-03 NOTE — ED Triage Notes (Signed)
Patient complains of intermittent alternating leg pain that started several weeks ago. Patient states pain starts in left back and radiates down one leg, alternating which leg through out the day. Patient alert, oriented, and ambulatory.

## 2020-10-03 NOTE — ED Provider Notes (Signed)
[place] EMERGENCY DEPARTMENT Provider Note   CSN: [id number] Arrival date & time: 10/03/20  6967     History Chief Complaint  Patient presents with  . Leg Pain    [name] is a 70 y.o. female.  HPI   70 year old female history of A. fib, on chronic anticoagulation, history of hypertension, arthritis presents today complaining of low back pain with some radiation to the left flank and intermittent radiation down the left leg into the left calf.  This has been present for 2 years.  She describes the pain as being sharp and throbbing.  It started in her back.  She has taken Tylenol without relief.  She denies any fever, chills, hematuria, dysuria, frequency of urination, history of DVT, history of PE, swelling or redness in her back or legs, any history of trauma including falls or heavy lifting.  She denies having similar symptoms in the past.  Review of records reveal a recent visit on January 28 to the urgent care where she was diagnosed with left-sided trochanteric bursitis.  At that time she received a prescription for hydrocodone and and was given a Decadron injection.  Past Medical History:  Diagnosis Date  . Asthma in adult, mild intermittent, uncomplicated   . Chronic anticoagulation   . Chronic constipation   . Eczema   . GERD (gastroesophageal reflux disease)   . History of kidney stones   . Hx of vaginal bleeding, resolved, ultrasound completed 10/11/2018  . Hypertension   . Personal history of atrial fibrillation, s/p ablation, now in NSR but remains on anticoaulation 11/26/2015   Followed in cardiology clinic     Patient Active Problem List   Diagnosis Date Noted  . Chronic seasonal allergic rhinitis 07/25/2020  . Asthma in adult, mild intermittent, uncomplicated   . Chronic bilateral lower abdominal pain 07/24/2020  . 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 ([place]) 07/23/2020  . Chronic lower back pain 02/14/2020  . Primary  osteoarthritis involving multiple joints 06/14/2018  . Osteoporosis without current pathological fracture 04/15/2017  . Constipation, chronic 10/19/2016  . Bilateral hearing loss due to cerumen impaction 09/10/2016  . Hyperlipidemia 03/18/2016  . HTN (hypertension), benign   . GERD (gastroesophageal reflux disease)   . Personal history of atrial fibrillation, now s/p ablation in NSR 11/26/2015  . Marijuana abuse 06/11/2015    Past Surgical History:  Procedure Laterality Date  . ATRIAL FIBRILLATION ABLATION N/A 09/30/2017   Procedure: ATRIAL FIBRILLATION ABLATION;  Surgeon: Thompson Grayer, MD;  Location: [facility];  Service: Cardiovascular;  Laterality: N/A;  . CHOLECYSTECTOMY N/A 12/08/2018   Procedure: LAPAROSCOPIC CHOLECYSTECTOMY WITH INTRAOPERATIVE CHOLANGIOGRAM;  Surgeon: Jovita Kussmaul, MD;  Location: [facility];  Service: General;  Laterality: N/A;  . ERCP N/A 12/09/2018   Procedure: ENDOSCOPIC RETROGRADE CHOLANGIOPANCREATOGRAPHY (ERCP);  Surgeon: Carol Ada, MD;  Location: [place];  Service: Endoscopy;  Laterality: N/A;  . SPHINCTEROTOMY  12/09/2018   Procedure: SPHINCTEROTOMY;  Surgeon: Carol Ada, MD;  Location: [facility];  Service: Endoscopy;;  . TONSILLECTOMY    . TUBAL LIGATION       OB History   No obstetric history on file.     Family History  Problem Relation Age of Onset  . Heart attack Mother        [name] age  . Breast cancer Sister        Late 80s; now s/p mastectomy   . Lung cancer Brother        x  2  . Cancer Sister   . Cancer Sister   . Cancer Sister   . Colon cancer Neg Hx   . Colon polyps Neg Hx   . Esophageal cancer Neg Hx   . Rectal cancer Neg Hx   . Stomach cancer Neg Hx     Social History   Tobacco Use  . Smoking status: Former Smoker    Quit date: 10/24/2015    Years since quitting: 4.9  . Smokeless tobacco: Never Used  Vaping Use  . Vaping Use: Never used  Substance Use Topics  . Alcohol use: Not Currently  . Drug use:  Not Currently    Types: Marijuana    Comment: Smoked marijuana in the past.    Home Medications Prior to Admission medications   Medication Sig Start Date End Date Taking? Authorizing Provider  atorvastatin (LIPITOR) 20 MG tablet Take 1 tablet (20 mg total) by mouth daily. 09/18/20 09/18/21  Angelica Pou, MD  Azelastine-Fluticasone [phone] MCG/ACT SUSP One spray in each nostril twice a day, every day 07/24/20   Angelica Pou, MD  carbamide peroxide (DEBROX) 6.5 % OTIC solution Place 5 drops into each ear to soften ear wax the night before and the morning of your next clinic appointment. 07/25/20   Angelica Pou, MD  cetirizine (ZYRTEC ALLERGY) 10 MG tablet Take 1 tablet (10 mg total) by mouth daily. 09/18/20 09/18/21  Angelica Pou, MD  diltiazem (CARDIZEM) 60 MG tablet Take 1 tablet (60 mg total) by mouth every 6 (six) hours as needed (Rapid AFIB over 100). 05/09/20   Sherran Needs, NP  diltiazem (CARTIA XT) 120 MG 24 hr capsule Take 1 capsule (120 mg total) by mouth 2 (two) times daily. 01/22/20   Sherran Needs, NP  fluticasone (FLOVENT HFA) 44 MCG/ACT inhaler Inhale 1 puff into the lungs daily. 09/18/20   Angelica Pou, MD  HYDROcodone-acetaminophen (NORCO/VICODIN) 5-325 MG tablet Take 1 tablet by mouth every 6 (six) hours as needed for moderate pain or severe pain. 09/20/20   Vanessa Kick, MD  lactulose (CEPHULAC) 10 g packet Take 1 packet (10 g total) by mouth 2 (two) times daily. Start with taking 1 packet 2 times a day and if that does not produce soft bowel movements daily you can increase it to 3 times a day 08/11/20   Blanchie Dessert, MD  levalbuterol [facility] HFA) 45 MCG/ACT inhaler INHALE 1 PUFF INTO THE LUNGS EVERY 6 HOURS AS NEEDED FOR SHORTNESS OF BREATH 09/14/20   Riesa Pope, MD  linaclotide [facility]) 72 MCG capsule TAKE 1 CAPSULE(145 MCG) BY MOUTH DAILY BEFORE BREAKFAST 07/25/20   Angelica Pou, MD  losartan (COZAAR) 50 MG tablet  Take 2 tablets (100 mg total) by mouth daily. 09/18/20 01/31/21  Angelica Pou, MD  meloxicam (MOBIC) 15 MG tablet Take 1 tablet daily with food for 14 days. Then take as needed. 09/24/20   Lockamy, Christia Reading, DO  montelukast (SINGULAIR) 10 MG tablet Take 1 tablet (10 mg total) by mouth at bedtime. 09/19/20   Angelica Pou, MD  pantoprazole (PROTONIX) 40 MG tablet Take 1 tablet (40 mg total) by mouth daily. 07/24/20   Angelica Pou, MD  rivaroxaban (XARELTO) 20 MG TABS tablet Take 1 tablet (20 mg total) by mouth daily with supper. 11/07/19   Sherran Needs, NP  tiotropium (SPIRIVA) 18 MCG inhalation capsule Place 1 capsule (18 mcg total) into inhaler and inhale daily. 04/16/20   Dorian Pod  Webb Silversmith, MD    Allergies    Albuterol, Percocet [oxycodone-acetaminophen], Celecoxib, and Protonix [pantoprazole sodium]  Review of Systems   Review of Systems  All other systems reviewed and are negative.   Physical Exam Updated Vital Signs BP (!) 158/95 (BP Location: Left Arm)   Pulse 85   Temp 98.4 F (36.9 C)   Resp 16   SpO2 99%   Physical Exam Vitals and nursing note reviewed.  Constitutional:      Appearance: Normal appearance.  HENT:     Head: Normocephalic.     Right Ear: External ear normal.     Left Ear: External ear normal.     Nose: Nose normal.     Mouth/Throat:     Mouth: Mucous membranes are moist.  Eyes:     Pupils: Pupils are equal, round, and reactive to light.  Cardiovascular:     Rate and Rhythm: Normal rate and regular rhythm.     Pulses: Normal pulses.     Heart sounds: Normal heart sounds.  Pulmonary:     Effort: Pulmonary effort is normal.     Breath sounds: Normal breath sounds.  Abdominal:     Palpations: Abdomen is soft.  Musculoskeletal:        General: Normal range of motion.     Cervical back: Normal range of motion.     Comments: Low back examined without external signs of trauma, no point ttp, redness Left leg examined with mild ttp  medial aspect, no redness, no cord, no swelling Left foot with good dp  Skin:    General: Skin is warm.     Capillary Refill: Capillary refill takes less than 2 seconds.  Neurological:     General: No focal deficit present.     Mental Status: She is alert.  Psychiatric:        Mood and Affect: Mood normal.     ED Results / Procedures / Treatments   Labs (all labs ordered are listed, but only abnormal results are displayed) Labs Reviewed  URINALYSIS, ROUTINE W REFLEX MICROSCOPIC    EKG None  Radiology No results found.  Procedures Procedures   Medications Ordered in ED Medications  HYDROcodone-acetaminophen (NORCO/VICODIN) 5-325 MG per tablet 1 tablet (has no administration in time range)    ED Course  I have reviewed the triage vital signs and the nursing notes.  Pertinent labs & imaging results that were available during my care of the patient were reviewed by me and considered in my medical decision making (see chart for details).    MDM Rules/Calculators/A&P                          Left hip /back/flank pain Evaluated for back injury- plain film djd but no acute process noted.  No neuro deficit requiring further imaging at this time Urine pending -ddx with renal colic, uti, other flank/abdominal etiologies less likely Left hip pain- no physical exam finding c.w. hip fx, patient with pain with standing but bearing weight and ambulating. Pain improved with hydrocodone and requesting more.  Advised unable to  Prescribe any further scheduled narcotics at this time. Will trial smr and consider neurontin Patient states she has follow up with Dr. Jimmye Norman next Thursday.  Final Clinical Impression(s) / ED Diagnoses Final diagnoses:  Acute left-sided low back pain with left-sided sciatica    Rx / DC Orders ED Discharge Orders    None  Pattricia Boss, MD 10/03/20 [phone]

## 2020-10-03 NOTE — Discharge Instructions (Signed)
No evidence of broken bones was seen on your x-[name]. Your urine does not appear to be broken Please keep your appointment with your doctor next week.

## 2020-10-07 NOTE — Assessment & Plan Note (Signed)
Beginning  atorvastatin   20 mg pill daily; recheck  6 months.

## 2020-10-07 NOTE — Assessment & Plan Note (Signed)
Restart ceterizine per patient request (originally she had reported no benefit) and advised to obtain saline nasal irrigation kit.

## 2020-10-07 NOTE — Assessment & Plan Note (Signed)
Vitamin D level to be drawn today.

## 2020-10-07 NOTE — Assessment & Plan Note (Signed)
Uncontrolled:  Increase losartan to 2 x 50 mg daily= 100 mg every day.  If controlled at next visit, will change pill dose to 100 mg, one daily.

## 2020-10-07 NOTE — Assessment & Plan Note (Signed)
Recent brief hospitalization for asthma exacerbation, treated with bronchodilator and oral steroid therapy. She is currently feeling well and will continue maintenance regimen.

## 2020-10-07 NOTE — Assessment & Plan Note (Signed)
She remains very dissatisfied with bowel pattern. Trial dulcolax suppository. Advised to take Linzess every day with goal of regularity. She is encouraged to CALL us (rather than go to ED) for any problems or questions.

## 2020-10-09 ENCOUNTER — Other Ambulatory Visit: Payer: Self-pay | Admitting: Internal Medicine

## 2020-10-09 ENCOUNTER — Other Ambulatory Visit: Payer: Self-pay

## 2020-10-09 ENCOUNTER — Encounter: Payer: Self-pay | Admitting: Internal Medicine

## 2020-10-09 ENCOUNTER — Telehealth: Payer: Self-pay | Admitting: *Deleted

## 2020-10-09 ENCOUNTER — Ambulatory Visit (INDEPENDENT_AMBULATORY_CARE_PROVIDER_SITE_OTHER): Payer: Medicare Other | Admitting: Internal Medicine

## 2020-10-09 VITALS — BP 164/93 | HR 81 | Temp 98.1°F | Ht 67.0 in | Wt 146.8 lb

## 2020-10-09 DIAGNOSIS — J4520 Mild intermittent asthma, uncomplicated: Secondary | ICD-10-CM

## 2020-10-09 DIAGNOSIS — E785 Hyperlipidemia, unspecified: Secondary | ICD-10-CM

## 2020-10-09 DIAGNOSIS — K5909 Other constipation: Secondary | ICD-10-CM

## 2020-10-09 DIAGNOSIS — J3089 Other allergic rhinitis: Secondary | ICD-10-CM

## 2020-10-09 DIAGNOSIS — R109 Unspecified abdominal pain: Secondary | ICD-10-CM | POA: Diagnosis not present

## 2020-10-09 DIAGNOSIS — G8929 Other chronic pain: Secondary | ICD-10-CM

## 2020-10-09 DIAGNOSIS — J302 Other seasonal allergic rhinitis: Secondary | ICD-10-CM

## 2020-10-09 DIAGNOSIS — M545 Low back pain, unspecified: Secondary | ICD-10-CM | POA: Diagnosis not present

## 2020-10-09 MED ORDER — TRAMADOL HCL 50 MG PO TABS: 50.0000 mg | ORAL_TABLET | Freq: Three times a day (TID) | ORAL | 0 refills | Status: DC | PRN

## 2020-10-09 NOTE — Assessment & Plan Note (Signed)
She acknowledges improvement after resuming Zyrtec and beginning nasal saline flushes.  Symptoms are not currently bothering her.

## 2020-10-09 NOTE — Telephone Encounter (Signed)
Notified patient that Rx has been sent and to call pharmacy to ensure it is ready. She was very Patent attorney. Hubbard Hartshorn, BSN, RN-BC

## 2020-10-09 NOTE — Assessment & Plan Note (Signed)
Seen in emergency room recently, x-rays negative.  Cyclobenzaprine and/or gabapentin caused her to have difficulty breathing and "messed up my heart", so she discontinued these.  On exam, tenderness is most intense in the left lower paraspinal area where there is a visible swelling, the boundaries being such that paraspinal muscle spasm does not seem consistent.  No overlying ecchymosis.  Because she had a fall preceding her symptoms, possibility of soft tissue hematoma as she is on anticoagulation.  CT ordered.  No radicular symptoms today.  2 weeks of tramadol as needed

## 2020-10-09 NOTE — Assessment & Plan Note (Signed)
Asymptomatic, chest clear.  Continue current medications.

## 2020-10-09 NOTE — Assessment & Plan Note (Signed)
Improvement upon adding MiraLAX to daily Linzess.  She did not pick up the prescription for Dulcolax suppository and does not need it now.  She continues to be very obsessed about her bowel habits and worries now that she is taking too much medicine to help the move.  She is advised that she may adjust the frequency of her medications to what ever pattern she is most comfortable with.

## 2020-10-09 NOTE — Telephone Encounter (Signed)
Done- but she should gve the pharm time to fill it

## 2020-10-09 NOTE — Patient Instructions (Signed)
Ms. Stricker, I'm sorry you're experiencing so much discomfort.  You were informed by [facility] rep that you shouldn't take the gabapentin or the flexeril.  Thank you for finding the name and contact number of this individual so that I might reach out to discuss this and other advice you received.    I will refer you for a CT to ensure that your pain is not from a blood accumulation in the wall of your abdomen which is causing your pain.  You will take tramadol 1 tab up to 3 times a day for pain for a week as needed.  If there isn't any bleeding causing your pain, we will need to go to PT to work on muscle spasms which are likely also contributing to your pain.  They can do other interventions besides stretches and strengthening to help with the pain.  I have discontinued your cholesterol medicine due to your worry about side effects.  We'll discuss an alternative once your pain is under better control.  Please come to see me or one of my partners when you are having problems! I think it will help considerably to minimize confusion that happens when you see many different doctors who prescribe many different medicines.  We'll get to the bottom of this pain!  See you in about month (earlier if needed of course!)  Dr. Jimmye Norman

## 2020-10-09 NOTE — Progress Notes (Signed)
Established Patient Office Visit  Subjective:  Patient ID: [name], female    DOB: November 26, 1950  Age: 70 y.o. MRN: [id number]  CC: No chief complaint on file.   HPI [name] presents for f/u of chronic nasal symptoms, chronic constipation, and change in blood pressure medicines.  Since last visit on 09/18/20, she was evaluated on 09/20/20 at Urgent Care for c/o L hip pain dx trochanteric bursitis (8 tabs hydrocodone, dexamethasone injection); at Sports Medicine clinic on 09/24/20 for chronic bilateral LBP w/o sciatica ( PT recommended and refused, home exercises provided, voltaren gel suggested as was f/u for xray in 4 wks if no improvement). Seen on 10/03/20 in ED for same LBP, given norco in ED and patient requested more which was deferred to judgment of PCP (today's appt); rather, prescriptions for cyclobenzaprine and gabapentin were provided. .    Since that time, she reports that someone from [name] who works with [name] called her to tell her that she was "prescribed the wrong medicine and that she should tell me to change to tramadol, and that what she needed was an MRI because the xrays won't show the problem." She cannot find the contact number or name for this individual who contacted her after the ED visit on 10/03/20.    She took a gabapentin and cyclobenzarpine once or twice and felt like she was having problems breathing and that her" heart was fluttering real bad so that's why he [name] it was the wrong medicine and that I should stop it."   "So that man told me I need an MRI and some tramadol so that's why I'm here."  Bowel function is better.  She didn't fill the suppository prescription, but took the Linzess together with Miralax. "But I think that's too much".  We discussed this. She is not having overly active bowels but it is her perception that she is taking too much medicine for this problem.  She stopped the atorvastatin because it made her left leg hurt worse.   The pain didn't go away after cessation.  "They told me that the medicine has a lot of side effects and that it causes pain and so they told me to stop it. "  She doesn't recall who "they" is.  Asthma is doing ok.  No problems with medications.    Started taking Zyrtec and salt water spray which is helping her nasal and sinus symptoms.  She has been taking 2 of the losartan 50 mg daily as instructed.  She has checked BP at home and the SBP has been as low as 130s.  She also has the 100 mg to begin taking once daily when the 50s have been completed.    Patient Active Problem List   Diagnosis Date Noted  . Seasonal and perennial allergic (presumed) rhinitis 07/25/2020  . Asthma in adult, mild intermittent, uncomplicated   . Chronic bilateral lower abdominal pain 07/24/2020  . 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 ([place]) 07/23/2020  . Subacute left lower back pain 02/14/2020  . Primary osteoarthritis involving multiple joints 06/14/2018  . Osteoporosis without current pathological fracture 04/15/2017  . Constipation, chronic 10/19/2016  . Bilateral hearing loss due to cerumen impaction 09/10/2016  . Hyperlipidemia 03/18/2016  . GERD (gastroesophageal reflux disease)   . Personal history of atrial fibrillation, now s/p ablation in NSR 11/26/2015  . Marijuana abuse 06/11/2015    Current Outpatient Medications on File Prior to Visit  Medication Sig Dispense Refill  .  Azelastine-Fluticasone 137-50 MCG/ACT SUSP One spray in each nostril twice a day, every day 23 g 5  . carbamide peroxide (DEBROX) 6.5 % OTIC solution Place 5 drops into each ear to soften ear wax the night before and the morning of your next clinic appointment. 15 mL 0  . cetirizine (ZYRTEC ALLERGY) 10 MG tablet Take 1 tablet (10 mg total) by mouth daily. 30 tablet 2  . diltiazem (CARDIZEM) 60 MG tablet Take 1 tablet (60 mg total) by mouth every 6 (six) hours as needed (Rapid AFIB over 100). 45 tablet 1  . diltiazem  (CARTIA XT) 120 MG 24 hr capsule Take 1 capsule (120 mg total) by mouth 2 (two) times daily. 180 capsule 2  . fluticasone (FLOVENT HFA) 44 MCG/ACT inhaler Inhale 1 puff into the lungs daily. 30.8 g 1  . gabapentin (NEURONTIN) 100 MG capsule Take 1 capsule (100 mg total) by mouth 3 (three) times daily. 15 capsule 0  . levalbuterol (XOPENEX HFA) 45 MCG/ACT inhaler INHALE 1 PUFF INTO THE LUNGS EVERY 6 HOURS AS NEEDED FOR SHORTNESS OF BREATH 45 g 1  . linaclotide (LINZESS) 72 MCG capsule TAKE 1 CAPSULE(145 MCG) BY MOUTH DAILY BEFORE BREAKFAST 90 capsule 5  . losartan (COZAAR) 50 MG tablet Take 2 tablets (100 mg total) by mouth daily. 135 tablet 3  . montelukast (SINGULAIR) 10 MG tablet Take 1 tablet (10 mg total) by mouth at bedtime. 90 tablet 3  . pantoprazole (PROTONIX) 40 MG tablet Take 1 tablet (40 mg total) by mouth daily. 30 tablet 5  . rivaroxaban (XARELTO) 20 MG TABS tablet Take 1 tablet (20 mg total) by mouth daily with supper. 30 tablet 6  . tiotropium (SPIRIVA) 18 MCG inhalation capsule Place 1 capsule (18 mcg total) into inhaler and inhale daily. 90 capsule 1    PE:  142/92, 86, 98% RA, weight 146 pounds 13 ounce  She is nicely dressed and groomed.  Soon as I walked in the room, she began shaking her head, indicating that she was in misery "10 out of 10" pain.  Lungs clear to auscultation, no wheezes (recent asthma exacerbation).  Heart is regular rate and rhythm.  When asked to stand, she gingerly pushes herself up from chair.  Spine is held in a slight flexion to the right.  She has marked tenderness along the lower paralumbar muscles on the left, and a visible proximal 5 cm swelling is noted discretely in the area superior to her SI joint.  Very tender and may have very slightly increased warmth compared to surrounding skin.  No change in color or bruising noted.  This tenderness seems to track along left lateral obliques to her left lower quadrant.  This tenderness does seem to be more in  the body wall than in deep tissues.  She notes pain worse when sitting spine to the left compared to the right.  While her tenderness does seem to be real, there is a component of distractibility.  She is able to walk with an antalgic gait that does not favor one particular side.  A/P: Ms. Moisan back pain situation presents a challenging problem.  Low-dose gabapentin and muscle relaxer could certainly be helpful for spasm and any neurogenic component of pain.  Because she experienced perceived side effect and was told that these were "the wrong medicines", she will not reconsider similar ones.  The pain she is describing does not seem to be radicular given tenderness on exam inconsistent with a neurogenic etiology.  MRI is not indicated.  I do feel that because she is on an anticoagulant and sustained a fall prior to the onset of this pain, soft tissue hematoma should be considered and CT is ordered.  She has tried high-strength Tylenol without benefit.  Tramadol 50 mg 1 p.o. 3 times daily as needed x2 weeks as prescribed.  She understands that this will not correct the cause of pain, but will simply mask it.  She is hopeful to be able to get rest. I agree with other providers that PT has an important role in management of her chronic back pain (prior to the acute exacerbation).  Referral will be placed. She agrees this time.  BP is uncontrolled here in the office, though she gets lower measurements at home.  She is advised to continue losartan 50 mg 2 tabs daily until the supply is gone, at which time she will begin taking 100 mg 1 tab daily.  See problem list for additional documentation. Follow-up:     Angelica Pou, MD

## 2020-10-09 NOTE — Assessment & Plan Note (Signed)
Patient self discontinued the statin due to increased pain in her left leg.  She has had difficulty in her left leg for some time but felt that the pain had worsened.  The pain did not improve after cessation of the medicine.  She is very worried about potential side effects, as she was told "these medicines cause a lot of pain and I do not want to take something that causes me pain."  I will address an alternative at a future visit.

## 2020-10-09 NOTE — Telephone Encounter (Signed)
Patient called in asking id Rx for tramadol has been sent to Pharmacy. She is requesting a call back when this has been done. Hubbard Hartshorn, BSN, RN-BC

## 2020-10-10 ENCOUNTER — Other Ambulatory Visit (HOSPITAL_COMMUNITY): Payer: Self-pay | Admitting: Pediatrics

## 2020-10-13 ENCOUNTER — Emergency Department (HOSPITAL_COMMUNITY)
Admission: EM | Admit: 2020-10-13 | Discharge: 2020-10-13 | Disposition: A | Discharge status: Home / Self Care | Payer: Medicare Other | Attending: Emergency Medicine | Admitting: Emergency Medicine

## 2020-10-13 ENCOUNTER — Other Ambulatory Visit: Payer: Self-pay

## 2020-10-13 ENCOUNTER — Encounter (HOSPITAL_COMMUNITY): Payer: Self-pay | Admitting: Pharmacy Technician

## 2020-10-13 ENCOUNTER — Other Ambulatory Visit (HOSPITAL_COMMUNITY): Payer: Self-pay

## 2020-10-13 ENCOUNTER — Emergency Department (HOSPITAL_COMMUNITY): Payer: Medicare Other

## 2020-10-13 ENCOUNTER — Telehealth: Payer: Self-pay

## 2020-10-13 DIAGNOSIS — R1032 Left lower quadrant pain: Secondary | ICD-10-CM | POA: Insufficient documentation

## 2020-10-13 DIAGNOSIS — I491 Atrial premature depolarization: Secondary | ICD-10-CM | POA: Diagnosis not present

## 2020-10-13 DIAGNOSIS — Z7901 Long term (current) use of anticoagulants: Secondary | ICD-10-CM | POA: Diagnosis not present

## 2020-10-13 DIAGNOSIS — M545 Low back pain, unspecified: Secondary | ICD-10-CM | POA: Insufficient documentation

## 2020-10-13 DIAGNOSIS — Z7952 Long term (current) use of systemic steroids: Secondary | ICD-10-CM | POA: Diagnosis not present

## 2020-10-13 DIAGNOSIS — M7918 Myalgia, other site: Secondary | ICD-10-CM

## 2020-10-13 DIAGNOSIS — J4520 Mild intermittent asthma, uncomplicated: Secondary | ICD-10-CM | POA: Insufficient documentation

## 2020-10-13 DIAGNOSIS — Z79899 Other long term (current) drug therapy: Secondary | ICD-10-CM | POA: Insufficient documentation

## 2020-10-13 DIAGNOSIS — I493 Ventricular premature depolarization: Secondary | ICD-10-CM | POA: Diagnosis not present

## 2020-10-13 DIAGNOSIS — M791 Myalgia, unspecified site: Secondary | ICD-10-CM | POA: Diagnosis not present

## 2020-10-13 DIAGNOSIS — R6889 Other general symptoms and signs: Secondary | ICD-10-CM | POA: Diagnosis not present

## 2020-10-13 DIAGNOSIS — Z87891 Personal history of nicotine dependence: Secondary | ICD-10-CM | POA: Diagnosis not present

## 2020-10-13 DIAGNOSIS — I10 Essential (primary) hypertension: Secondary | ICD-10-CM | POA: Insufficient documentation

## 2020-10-13 DIAGNOSIS — E876 Hypokalemia: Secondary | ICD-10-CM | POA: Insufficient documentation

## 2020-10-13 DIAGNOSIS — R079 Chest pain, unspecified: Secondary | ICD-10-CM | POA: Diagnosis not present

## 2020-10-13 DIAGNOSIS — R0789 Other chest pain: Secondary | ICD-10-CM | POA: Diagnosis not present

## 2020-10-13 DIAGNOSIS — I499 Cardiac arrhythmia, unspecified: Secondary | ICD-10-CM | POA: Diagnosis not present

## 2020-10-13 DIAGNOSIS — R109 Unspecified abdominal pain: Secondary | ICD-10-CM | POA: Diagnosis not present

## 2020-10-13 DIAGNOSIS — Z743 Need for continuous supervision: Secondary | ICD-10-CM | POA: Diagnosis not present

## 2020-10-13 LAB — CBC WITH DIFFERENTIAL/PLATELET
Abs Immature Granulocytes: 0.01 10*3/uL (ref 0.00–0.07)
Basophils Absolute: 0 10*3/uL (ref 0.0–0.1)
Basophils Relative: 1 %
Eosinophils Absolute: 0.2 10*3/uL (ref 0.0–0.5)
Eosinophils Relative: 6 %
HCT: 40.8 % (ref 36.0–46.0)
Hemoglobin: 13.4 g/dL (ref 12.0–15.0)
Immature Granulocytes: 0 %
Lymphocytes Relative: 31 %
Lymphs Abs: 1 10*3/uL (ref 0.7–4.0)
MCH: 26.8 pg (ref 26.0–34.0)
MCHC: 32.8 g/dL (ref 30.0–36.0)
MCV: 81.6 fL (ref 80.0–100.0)
Monocytes Absolute: 0.5 10*3/uL (ref 0.1–1.0)
Monocytes Relative: 14 %
Neutro Abs: 1.7 10*3/uL (ref 1.7–7.7)
Neutrophils Relative %: 48 %
Platelets: 262 10*3/uL (ref 150–400)
RBC: 5 MIL/uL (ref 3.87–5.11)
RDW: 15.9 % — ABNORMAL HIGH (ref 11.5–15.5)
WBC: 3.4 10*3/uL — ABNORMAL LOW (ref 4.0–10.5)
nRBC: 0 % (ref 0.0–0.2)

## 2020-10-13 LAB — COMPREHENSIVE METABOLIC PANEL
ALT: 19 U/L (ref 0–44)
AST: 18 U/L (ref 15–41)
Albumin: 3.9 g/dL (ref 3.5–5.0)
Alkaline Phosphatase: 75 U/L (ref 38–126)
Anion gap: 11 (ref 5–15)
BUN: 9 mg/dL (ref 8–23)
CO2: 23 mmol/L (ref 22–32)
Calcium: 9.8 mg/dL (ref 8.9–10.3)
Chloride: 106 mmol/L (ref 98–111)
Creatinine, Ser: 0.69 mg/dL (ref 0.44–1.00)
GFR, Estimated: >60 mL/min (ref >60)
Glucose, Bld: 100 mg/dL — ABNORMAL HIGH (ref 70–99)
Potassium: 3 mmol/L — ABNORMAL LOW (ref 3.5–5.1)
Sodium: 140 mmol/L (ref 135–145)
Total Bilirubin: 0.5 mg/dL (ref 0.3–1.2)
Total Protein: 7.3 g/dL (ref 6.5–8.1)

## 2020-10-13 LAB — TSH: TSH: 1.479 u[IU]/mL (ref 0.350–4.500)

## 2020-10-13 LAB — MAGNESIUM: Magnesium: 1.9 mg/dL (ref 1.7–2.4)

## 2020-10-13 LAB — TROPONIN I (HIGH SENSITIVITY)
Troponin I (High Sensitivity): 7 ng/L (ref <18)
Troponin I (High Sensitivity): 6 ng/L (ref <18)

## 2020-10-13 MED ORDER — HYDROCODONE-ACETAMINOPHEN 5-325 MG PO TABS: 1.0000 | ORAL_TABLET | Freq: Four times a day (QID) | ORAL | 0 refills | Status: DC | PRN

## 2020-10-13 MED ORDER — IOHEXOL 300 MG/ML  SOLN
100.0000 mL | Freq: Once | INTRAMUSCULAR | Status: AC | PRN
  Administered 2020-10-13: 100 mL via INTRAVENOUS

## 2020-10-13 MED ORDER — POTASSIUM CHLORIDE CRYS ER 20 MEQ PO TBCR
20.0000 meq | EXTENDED_RELEASE_TABLET | Freq: Once | ORAL | Status: AC
  Administered 2020-10-13: 20 meq via ORAL
  Filled 2020-10-13: qty 1

## 2020-10-13 MED ORDER — LIDOCAINE 4 % EX PTCH: MEDICATED_PATCH | CUTANEOUS | 0 refills | Status: DC

## 2020-10-13 MED ORDER — CYCLOBENZAPRINE HCL 5 MG PO TABS: 5.0000 mg | ORAL_TABLET | Freq: Three times a day (TID) | ORAL | 0 refills | Status: DC | PRN

## 2020-10-13 MED ORDER — FENTANYL CITRATE (PF) 100 MCG/2ML IJ SOLN
50.0000 ug | Freq: Once | INTRAMUSCULAR | Status: AC
  Administered 2020-10-13: 50 ug via INTRAVENOUS
  Filled 2020-10-13: qty 2

## 2020-10-13 MED ORDER — RIVAROXABAN 20 MG PO TABS: 20.0000 mg | ORAL_TABLET | Freq: Every day | ORAL | 11 refills | Status: DC

## 2020-10-13 MED ORDER — MAGNESIUM SULFATE IN D5W 1-5 GM/100ML-% IV SOLN: 1.0000 g | Freq: Once | INTRAVENOUS | Status: DC

## 2020-10-13 MED ORDER — POTASSIUM CHLORIDE CRYS ER 20 MEQ PO TBCR
40.0000 meq | EXTENDED_RELEASE_TABLET | Freq: Once | ORAL | Status: AC
  Administered 2020-10-13: 40 meq via ORAL
  Filled 2020-10-13: qty 2

## 2020-10-13 NOTE — ED Notes (Signed)
Patient Alert and oriented to baseline. Stable and ambulatory to baseline. Patient verbalized understanding of the discharge instructions.  Patient belongings were taken by the patient.  FD

## 2020-10-13 NOTE — ED Notes (Signed)
Patient transported to CT 

## 2020-10-13 NOTE — Telephone Encounter (Signed)
Pls would like for mamie to call her, she's in the hospital [phone]

## 2020-10-13 NOTE — Discharge Instructions (Addendum)
Ms. Kysa, [place] did have some irregular heart beats in the emergency department, although these were not frequent enough to require change in your medication. You were no longer in atrial fibrillation.   CT scans of your abdomen and pelvis did show degenerative (likely age-related) changes of your spine that likely explains the pain that you have been having.   I have prescribed lidocaine patches and Norco to your pharmacy. Please use as directed, placing 1 patch per day in the area most affected, and reserve Norco for pain that is uncontrolled by the lidocaine patch and over the counter pain medication such as Tylenol.   Please be sure to call [facility] to schedule a follow up appointment at [phone] and return to the ED if you experience new or worsening CP, prolonged palpitations lasting >30 minutes, light-headedness, dizziness or any other concerning symptoms.  Thank you and take care,  Dr. Konrad Penta

## 2020-10-13 NOTE — ED Notes (Signed)
Pt ambulated self to bathroom.  Gave her a cup for a urine sample.

## 2020-10-13 NOTE — ED Provider Notes (Signed)
[facility] EMERGENCY DEPARTMENT Provider Note   CSN: [id number] Arrival date & time: 10/13/20  0725   Chief Complaint: Left Lower Back Pain  History Chief Complaint  Patient presents with  . Chest Pain    ZYKIA WALLA is a 70 y.o. female.  HPI   Ms. Romberg is a 70 y.o. lady w/ PMHx Paroxysmal A-fib on Xarelto s/p Ablation in 09/30/2017, chronic constipation, and HTN presenting with chief complaint of left low back pain. She states she originally called EMS after experiencing palpitations and acute onset SOB this morning, consistent with her previous bouts of A-Fib. She took her morning dose of Diltiazem 160mg  early prior to calling EMS and by the time they arrived, her symptoms improved. She was found to have frequent PAC's and PVC's and was brought to the ED for further evaluation. She denies any palpitations, CP or SOB here, but notes severe left-sided low back pain, left-sided gluteal pain, and left-sided abdominal pain. She describes feeling a "knot" in her lower back that has been present along with above symptoms for about 2 weeks. Denies any recent falls or trauma. Does note history of constipation that has improved recently with Miralax and Linzess. She is supposed to get CT abdomen pelvis on October 23, 2020 for further evaluation but notes her pain continues to worsen. Endorses subjective fevers, insomnia due to pain, but denies any dark or bloody bowel movements, urinary symptoms, or weakness, still able to walk slowly and eat and drink well.   Past Medical History:  Diagnosis Date  . Asthma in adult, mild intermittent, uncomplicated   . Chronic anticoagulation   . Chronic constipation   . Eczema   . GERD (gastroesophageal reflux disease)   . History of kidney stones   . Hx of vaginal bleeding, resolved, ultrasound completed 10/11/2018  . Hypertension   . Personal history of atrial fibrillation, s/p ablation, now in NSR but remains on anticoaulation 11/26/2015    Followed in cardiology clinic     Patient Active Problem List   Diagnosis Date Noted  . Seasonal and perennial allergic (presumed) rhinitis 07/25/2020  . Asthma in adult, mild intermittent, uncomplicated   . Chronic bilateral lower abdominal pain 07/24/2020  . 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 ([place]) 07/23/2020  . Subacute left lower back pain 02/14/2020  . Primary osteoarthritis involving multiple joints 06/14/2018  . Osteoporosis without current pathological fracture 04/15/2017  . Constipation, chronic 10/19/2016  . Bilateral hearing loss due to cerumen impaction 09/10/2016  . Hyperlipidemia 03/18/2016  . GERD (gastroesophageal reflux disease)   . Personal history of atrial fibrillation, now s/p ablation in NSR 11/26/2015  . Marijuana abuse 06/11/2015    Past Surgical History:  Procedure Laterality Date  . ATRIAL FIBRILLATION ABLATION N/A 09/30/2017   Procedure: ATRIAL FIBRILLATION ABLATION;  Surgeon: Thompson Grayer, MD;  Location: [facility];  Service: Cardiovascular;  Laterality: N/A;  . CHOLECYSTECTOMY N/A 12/08/2018   Procedure: LAPAROSCOPIC CHOLECYSTECTOMY WITH INTRAOPERATIVE CHOLANGIOGRAM;  Surgeon: Jovita Kussmaul, MD;  Location: [facility];  Service: General;  Laterality: N/A;  . ERCP N/A 12/09/2018   Procedure: ENDOSCOPIC RETROGRADE CHOLANGIOPANCREATOGRAPHY (ERCP);  Surgeon: Carol Ada, MD;  Location: [facility];  Service: Endoscopy;  Laterality: N/A;  . SPHINCTEROTOMY  12/09/2018   Procedure: SPHINCTEROTOMY;  Surgeon: Carol Ada, MD;  Location: [facility];  Service: Endoscopy;;  . TONSILLECTOMY    . TUBAL LIGATION       OB History   No obstetric history on file.  Family History  Problem Relation Age of Onset  . Heart attack Mother        [name] age  . Breast cancer Sister        Late 69s; now s/p mastectomy   . Lung cancer Brother        x 2  . Cancer Sister   . Cancer Sister   . Cancer Sister   . Colon cancer Neg Hx   . Colon  polyps Neg Hx   . Esophageal cancer Neg Hx   . Rectal cancer Neg Hx   . Stomach cancer Neg Hx     Social History   Tobacco Use  . Smoking status: Former Smoker    Quit date: 10/24/2015    Years since quitting: 4.9  . Smokeless tobacco: Never Used  Vaping Use  . Vaping Use: Never used  Substance Use Topics  . Alcohol use: Not Currently  . Drug use: Not Currently    Types: Marijuana    Comment: Smoked marijuana in the past.    Home Medications Prior to Admission medications   Medication Sig Start Date End Date Taking? Authorizing Provider  Azelastine-Fluticasone 137-50 MCG/ACT SUSP One spray in each nostril twice a day, every day Patient taking differently: Place 1 spray into both nostrils daily. 07/24/20  Yes Angelica Pou, MD  cetirizine (ZYRTEC ALLERGY) 10 MG tablet Take 1 tablet (10 mg total) by mouth daily. Patient taking differently: Take 10 mg by mouth daily as needed for allergies. 09/18/20 09/18/21 Yes Angelica Pou, MD  cyclobenzaprine (FLEXERIL) 5 MG tablet Take 1 tablet (5 mg total) by mouth 3 (three) times daily as needed for muscle spasms. 10/13/20  Yes Jeralyn Bennett, MD  diltiazem (CARDIZEM) 60 MG tablet Take 1 tablet (60 mg total) by mouth every 6 (six) hours as needed (Rapid AFIB over 100). 05/09/20  Yes Sherran Needs, NP  diltiazem (CARTIA XT) 120 MG 24 hr capsule Take 1 capsule (120 mg total) by mouth 2 (two) times daily. 01/22/20  Yes Sherran Needs, NP  fluticasone (FLOVENT HFA) 44 MCG/ACT inhaler Inhale 1 puff into the lungs daily. 09/18/20  Yes Angelica Pou, MD  HYDROcodone-acetaminophen (NORCO/VICODIN) 5-325 MG tablet Take 1 tablet by mouth every 6 (six) hours as needed for severe pain. 10/13/20 10/13/21 Yes Jeralyn Bennett, MD  levalbuterol [facility] HFA) 45 MCG/ACT inhaler INHALE 1 PUFF INTO THE LUNGS EVERY 6 HOURS AS NEEDED FOR SHORTNESS OF BREATH Patient taking differently: Inhale 1 puff into the lungs every 6 (six) hours as needed for  shortness of breath. 09/14/20  Yes Katsadouros, Vasilios, MD  Lidocaine (HM LIDOCAINE PATCH) 4 % PTCH Apply 1 patch to area overlying worst pain once daily. Replace patch daily. 10/13/20  Yes Jeralyn Bennett, MD  LINZESS 145 MCG CAPS capsule Take 145 mcg by mouth every morning. 09/19/20  Yes [provider]  losartan (COZAAR) 50 MG tablet Take 2 tablets (100 mg total) by mouth daily. 09/18/20 01/31/21 Yes Angelica Pou, MD  montelukast (SINGULAIR) 10 MG tablet Take 1 tablet (10 mg total) by mouth at bedtime. 09/19/20  Yes Angelica Pou, MD  pantoprazole (PROTONIX) 40 MG tablet Take 1 tablet (40 mg total) by mouth daily. 07/24/20  Yes Angelica Pou, MD  rivaroxaban (XARELTO) 20 MG TABS tablet Take 1 tablet (20 mg total) by mouth daily with supper. 11/07/19  Yes Sherran Needs, NP  tiotropium (SPIRIVA) 18 MCG inhalation capsule Place 1 capsule (18 mcg total)  into inhaler and inhale daily. 04/16/20  Yes Angelica Pou, MD  carbamide peroxide [facility]) 6.5 % OTIC solution Place 5 drops into each ear to soften ear wax the night before and the morning of your next clinic appointment. Patient not taking: Reported on 10/13/2020 07/25/20   Angelica Pou, MD  gabapentin (NEURONTIN) 100 MG capsule Take 1 capsule (100 mg total) by mouth 3 (three) times daily. Patient not taking: Reported on 10/13/2020 10/03/20   Pattricia Boss, MD  linaclotide [facility]) 72 MCG capsule TAKE 1 CAPSULE(145 MCG) BY MOUTH DAILY BEFORE BREAKFAST Patient not taking: Reported on 10/13/2020 07/25/20   Angelica Pou, MD    Allergies    Albuterol, Percocet [oxycodone-acetaminophen], Celecoxib, Protonix [pantoprazole sodium], and Tramadol  Review of Systems   Review of Systems   10-point review of systems otherwise negative except as noted above in HPI.   Physical Exam Updated Vital Signs BP (!) 138/102   Pulse 83   Temp 98.3 F (36.8 C) (Oral)   Resp (!) 21   Ht 5\' 7"  (1.702 m)   Wt 66 kg    SpO2 97%   BMI 22.79 kg/m   Physical Exam   General: Patient appears well. No acute distress. Eyes: Sclera non-icteric. No conjunctival injection.  HENT: Moist mucus membranes. No nasal discharge. Respiratory: Lungs are CTA, bilaterally. No wheezes, rales, or rhonchi. No tachypnea or increased work of breathing on room air.  Cardiovascular: Regular rate and rhythm. No murmurs, rubs, or gallops. No lower extremity edema. Abdominal: LLQ is exquisitely tender to palpation with voluntary guarding. No other abdominal tenderness to palpation. Abdomen is mildly distended but soft. Bowel sounds intact throughout.  Musculoskeletal: There is severe tenderness to palpation over the left lumbar paraspinal muscles and left gluteal region with swelling and increased warmth. There is no bony tenderness to palpation of the left hip or spine. Able to move bilateral lower extremities equally.  Neurological: Patient is alert and oriented x 3. Sensation to light touch intact throughout bilateral lower extremities.    Skin: No lesions. No rashes. No ecchymosis or erythema.  Psych: Normal affect. Normal tone of voice.   ED Results / Procedures / Treatments   Labs (all labs ordered are listed, but only abnormal results are displayed) Labs Reviewed  CBC WITH DIFFERENTIAL/PLATELET - Abnormal; Notable for the following components:      Result Value   WBC 3.4 (*)    RDW 15.9 (*)    All other components within normal limits  COMPREHENSIVE METABOLIC PANEL - Abnormal; Notable for the following components:   Potassium 3.0 (*)    Glucose, Bld 100 (*)    All other components within normal limits  MAGNESIUM  TSH  TROPONIN I (HIGH SENSITIVITY)  TROPONIN I (HIGH SENSITIVITY)    EKG None  Radiology CT ABDOMEN PELVIS W CONTRAST  Result Date: 10/13/2020 CLINICAL DATA:  Low back and left lower quadrant abdominal pain. Atrial fibrillation. Palpitations. Anticoagulated. EXAM: CT ABDOMEN AND PELVIS WITH CONTRAST  TECHNIQUE: Multidetector CT imaging of the abdomen and pelvis was performed using the standard protocol following bolus administration of intravenous contrast. CONTRAST:  141mL OMNIPAQUE IOHEXOL 300 MG/ML  SOLN COMPARISON:  08/11/2020 unenhanced CT abdomen/pelvis. 01/03/2020 CT angiogram of the abdomen and pelvis. FINDINGS: Lower chest: No significant pulmonary nodules or acute consolidative airspace disease. Hepatobiliary: Normal liver size. Two small simple left liver cysts, largest 1.2 cm. Several subcentimeter hypodense lesions scattered in the liver, too small to characterize, not appreciably changed,  presumably benign. No appreciable new liver lesions. Cholecystectomy. Bile ducts are stable and within normal post cholecystectomy limits with CBD diameter 6 mm. Pancreas: Normal, with no mass or duct dilation. Spleen: Normal size. No mass. Adrenals/Urinary Tract: Normal adrenals. No hydronephrosis. Subcentimeter hypodense medial lower left renal cortical lesion is too small to characterize and requires no follow-up. No suspicious renal masses. Normal bladder. Stomach/Bowel: Small hiatal hernia. Otherwise normal nondistended stomach. Normal caliber small bowel with no small bowel wall thickening. Appendix not discretely visualized. No pericecal inflammatory changes. Minimal left colonic diverticulosis with no large bowel wall thickening or significant pericolonic fat stranding. Vascular/Lymphatic: Atherosclerotic abdominal aorta with 2.6 cm ectatic infrarenal abdominal aorta. Patent portal, splenic, hepatic and renal veins. Circumaortic left renal vein. No pathologically enlarged lymph nodes in the abdomen or pelvis. Reproductive: Grossly normal uterus.  No adnexal mass. Other: No pneumoperitoneum, ascites or focal fluid collection. Musculoskeletal: No aggressive appearing focal osseous lesions. Marked thoracolumbar spondylosis. IMPRESSION: 1. No acute abnormality. No evidence of bowel obstruction or acute bowel  inflammation. Minimal left colonic diverticulosis, with no evidence of acute diverticulitis. 2. Small hiatal hernia. 3. Ectatic 2.6 cm infrarenal abdominal aorta. Recommend follow-up ultrasound every 5 years. This recommendation follows ACR consensus guidelines: White Paper of the ACR Incidental Findings Committee II on Vascular Findings. J Am Coll Radiol 2013; 69:485-462. 4. Aortic Atherosclerosis (ICD10-I70.0). Electronically Signed   By: Ilona Sorrel M.D.   On: 10/13/2020 11:37   CT L-SPINE NO CHARGE  Result Date: 10/13/2020 CLINICAL DATA:  Low back pain. EXAM: CT LUMBAR SPINE WITH CONTRAST TECHNIQUE: Multiplanar CT images of the lumbar spine were reconstructed from contemporary CT of the abdomen and pelvis. CONTRAST:  100 mL Omnipaque 300 given for concurrent CT of the abdomen and pelvis COMPARISON:  Lumbar spine radiographs 10/03/2020. Chest CTA 05/29/2017. 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 The prior chest CT demonstrates 12 pairs of ribs. There are 5 lumbar vertebrae followed by the transitional segment which is considered a nearly completely lumbarized S1 with fully formed S1-2 disc. Alignment: Trace retrolisthesis of L2 on L3. Vertebrae: No acute fracture or suspicious osseous lesion. Scattered Schmorl's nodes, most notably involving the L4 inferior endplate. Paraspinal and other soft tissues: No acute abnormality identified in the paraspinal soft tissues. Intra-abdominal and pelvic contents reported separately. Disc levels: Mild-to-moderate disc space narrowing from L1-2 to L5-S1. L1-2: Mild disc bulging without evidence of significant stenosis. L2-3: Disc bulging results in mild bilateral neural foraminal stenosis without evidence of significant spinal stenosis. L3-4: Disc bulging results in mild-to-moderate bilateral neural foraminal stenosis without evidence of significant spinal stenosis. L4-5: Disc bulging and mild facet arthrosis result in moderate to severe  bilateral neural foraminal stenosis with potential bilateral L4 nerve root impingement. No evidence of significant spinal stenosis. L5-S1: Disc bulging and mild facet arthrosis result in moderate bilateral neural foraminal stenosis without evidence of significant spinal stenosis. S1-2: Minimal leftward disc bulging and mild facet arthrosis without stenosis. IMPRESSION: 1. Transitional lumbosacral anatomy as above. 2. No acute osseous abnormality identified in the lumbar spine. 3. 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 4. No significant spinal stenosis. Electronically Signed   By: Logan Bores M.D.   On: 10/13/2020 11:32    Procedures Procedures   Medications Ordered in ED Medications  potassium chloride SA (KLOR-CON) CR tablet 20 mEq (has no administration in time range)  iohexol (OMNIPAQUE) 300 MG/ML solution 100 mL (100 mLs Intravenous Contrast  Given 10/13/20 1033)  potassium chloride SA (KLOR-CON) CR tablet 40 mEq (40 mEq Oral Given 10/13/20 1141)  fentaNYL (SUBLIMAZE) injection 50 mcg (50 mcg Intravenous Given 10/13/20 1142)    ED Course  I have reviewed the triage vital signs and the nursing notes.  Pertinent labs & imaging results that were available during my care of the patient were reviewed by me and considered in my medical decision making (see chart for details).    MDM Rules/Calculators/A&P                          Assessment:   Ms. Dickenson is a 70 y.o. lady w/ PMHx notable for A-fib on Xarelto and Diltiazem, presenting due to concern for recurrence of A-fib this morning with frequent PVC's PAC's per EMS that have since improved in the ED. EKG shows NSR. Also with severe left low back pain, left gluteal pain, and LLQ pain. Has been followed in the outpatient setting for this; however, concerning for possible diverticulitis vs. Hematoma in setting of chronic anticoagulation.   Plan:   For arrhythmia,  will check TSH, CMP, magnesium For worsening pain/swelling, will check CBC w/ diff, CT abdomen/pelvis w/ contrast, L spine no charge.  - continue home Linzess   Results:   Labs notable for hypokalemia, K+ 3.0 and Mg 1.9. WBC 3.4 with normal differential, hemoglobin and TSH within normal limits.   - Will give 40 mEq PO KCl  - Will give 50 fentanyl for pain  CT abdomen pelvis shows minimal L colonic diverticulosis but no diverticulitis or evidence of bowel obstruction / inflammation. Patient has transitional lumbosacral anatomy and lumbar disc and facet degeneration with multilevel neural foraminal stenosis, moderate to severe bilaterally at L3-L4, although with no significant spinal stenosis. Incidentally found ot have ectatic 2.6cm infrarenal abdominal aorta, for which radiology recommend follow up ultrasound every 5 years. Patient's pain and tenderness are consistent with muscle spasm in the setting of nerve compression due to neural foraminal stenosis.   12:00pm: Patient complains of new chest pain that has been persistent for 30 minutes. Pain is substernal, sharp, non-pleuritic or exertional. Initial troponin 7.  - Will check 2nd troponin  1:55pm: Second troponin 6. Patient informed her CP is not secondary to ACS. She expresses concern that she had hypokalemia with recent discontinuation of her potassium pills.   - Will provide additional 20 mEq PO potassium prior to discharge - Will prescribe lidocaine patches daily and 6 tablets of Norco for breakthrough pain given patient has had negative side effects in taking cyclobenzaprine in the past. - Patient stable for discharge with close outpatient follow up   Final Clinical Impression(s) / ED Diagnoses Final diagnoses:  Low back pain  Hypokalemia  Atypical chest pain  PAC (premature atrial contraction)  PVC (premature ventricular contraction)  Musculoskeletal pain    Rx / DC Orders ED Discharge Orders         Ordered     cyclobenzaprine (FLEXERIL) 5 MG tablet  3 times daily PRN        10/13/20 1307    Lidocaine (HM LIDOCAINE PATCH) 4 % PTCH        10/13/20 1307    HYDROcodone-acetaminophen (NORCO/VICODIN) 5-325 MG tablet  Every 6 hours PRN        10/13/20 1307         Jeralyn Bennett, MD 10/13/2020, 1:59 PM Pager: [phone]    Jeralyn Bennett, MD 10/13/20 1400  Tegeler, Gwenyth Allegra, MD 10/14/20 [phone]

## 2020-10-13 NOTE — ED Triage Notes (Signed)
Pt bib ems from home with palpitations. Pt with hx of afib currently on xarelto. Pt also has been having back pain. Reports her MD is trying to get her a CT scan due to pain. Pt does reports some chest pain with the palpitations. SR with EMS with frequent pvc and pac's.

## 2020-10-13 NOTE — ED Provider Notes (Incomplete)
[facility] Provider Note   CSN: [id number] Arrival date & time: 10/13/20  0725   Chief Complaint: Left Lower Back Pain  History Chief Complaint  Patient presents with  . Chest Pain    [name] is a 70 y.o. female.  HPI   [name] is a 70 y.o. lady w/ PMHx Paroxysmal A-fib on Xarelto s/p Ablation in 09/30/2017, chronic constipation, and HTN presenting with chief complaint of left low back pain. She states she originally called EMS after experiencing palpitations and acute onset SOB this morning, consistent with her previous bouts of A-Fib. She took her morning dose of Diltiazem 160mg  early prior to calling EMS and by the time they arrived, her symptoms improved. She was found to have frequent PAC's and PVC's and was brought to the ED for further evaluation. She denies any palpitations, CP or SOB here, but notes severe left-sided low back pain, left-sided gluteal pain, and left-sided abdominal pain. She describes feeling a "knot" in her lower back that has been present along with above symptoms for about 2 weeks. Denies any recent falls or trauma. Does note history of constipation that has improved recently with Miralax and Linzess. She is supposed to get CT abdomen pelvis on October 23, 2020 for further evaluation but notes her pain continues to worsen. Endorses subjective fevers, insomnia due to pain, but denies any dark or bloody bowel movements, urinary symptoms, or weakness, still able to walk slowly and eat and drink well.   Past Medical History:  Diagnosis Date  . Asthma in adult, mild intermittent, uncomplicated   . Chronic anticoagulation   . Chronic constipation   . Eczema   . GERD (gastroesophageal reflux disease)   . History of kidney stones   . Hx of vaginal bleeding, resolved, ultrasound completed 10/11/2018  . Hypertension   . Personal history of atrial fibrillation, s/p ablation, now in NSR but remains on anticoaulation 11/26/2015    Followed in cardiology clinic     Patient Active Problem List   Diagnosis Date Noted  . Seasonal and perennial allergic (presumed) rhinitis 07/25/2020  . Asthma in adult, mild intermittent, uncomplicated   . Chronic bilateral lower abdominal pain 07/24/2020  . 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 ([place]) 07/23/2020  . Subacute left lower back pain 02/14/2020  . Primary osteoarthritis involving multiple joints 06/14/2018  . Osteoporosis without current pathological fracture 04/15/2017  . Constipation, chronic 10/19/2016  . Bilateral hearing loss due to cerumen impaction 09/10/2016  . Hyperlipidemia 03/18/2016  . GERD (gastroesophageal reflux disease)   . Personal history of atrial fibrillation, now s/p ablation in NSR 11/26/2015  . Marijuana abuse 06/11/2015    Past Surgical History:  Procedure Laterality Date  . ATRIAL FIBRILLATION ABLATION N/A 09/30/2017   Procedure: ATRIAL FIBRILLATION ABLATION;  Surgeon: Thompson Grayer, MD;  Location: [facility];  Service: Cardiovascular;  Laterality: N/A;  . CHOLECYSTECTOMY N/A 12/08/2018   Procedure: LAPAROSCOPIC CHOLECYSTECTOMY WITH INTRAOPERATIVE CHOLANGIOGRAM;  Surgeon: Jovita Kussmaul, MD;  Location: [facility];  Service: General;  Laterality: N/A;  . ERCP N/A 12/09/2018   Procedure: ENDOSCOPIC RETROGRADE CHOLANGIOPANCREATOGRAPHY (ERCP);  Surgeon: Carol Ada, MD;  Location: [facility];  Service: Endoscopy;  Laterality: N/A;  . SPHINCTEROTOMY  12/09/2018   Procedure: SPHINCTEROTOMY;  Surgeon: Carol Ada, MD;  Location: [facility];  Service: Endoscopy;;  . TONSILLECTOMY    . TUBAL LIGATION       OB History   No obstetric history on file.  Family History  Problem Relation Age of Onset  . Heart attack Mother        Annamaria Bartlett age  . Breast cancer Sister        Late 69s; now s/p mastectomy   . Lung cancer Brother        x 2  . Cancer Sister   . Cancer Sister   . Cancer Sister   . Colon cancer Neg Hx   . Colon  polyps Neg Hx   . Esophageal cancer Neg Hx   . Rectal cancer Neg Hx   . Stomach cancer Neg Hx     Social History   Tobacco Use  . Smoking status: Former Smoker    Quit date: 10/24/2015    Years since quitting: 4.9  . Smokeless tobacco: Never Used  Vaping Use  . Vaping Use: Never used  Substance Use Topics  . Alcohol use: Not Currently  . Drug use: Not Currently    Types: Marijuana    Comment: Smoked marijuana in the past.    Home Medications Prior to Admission medications   Medication Sig Start Date End Date Taking? Authorizing Provider  Azelastine-Fluticasone 137-50 MCG/ACT SUSP One spray in each nostril twice a day, every day 07/24/20   Angelica Pou, MD  carbamide peroxide (DEBROX) 6.5 % OTIC solution Place 5 drops into each ear to soften ear wax the night before and the morning of your next clinic appointment. 07/25/20   Angelica Pou, MD  cetirizine (ZYRTEC ALLERGY) 10 MG tablet Take 1 tablet (10 mg total) by mouth daily. 09/18/20 09/18/21  Angelica Pou, MD  diltiazem (CARDIZEM) 60 MG tablet Take 1 tablet (60 mg total) by mouth every 6 (six) hours as needed (Rapid AFIB over 100). 05/09/20   Sherran Needs, NP  diltiazem (CARTIA XT) 120 MG 24 hr capsule Take 1 capsule (120 mg total) by mouth 2 (two) times daily. 01/22/20   Sherran Needs, NP  fluticasone (FLOVENT HFA) 44 MCG/ACT inhaler Inhale 1 puff into the lungs daily. 09/18/20   Angelica Pou, MD  gabapentin (NEURONTIN) 100 MG capsule Take 1 capsule (100 mg total) by mouth 3 (three) times daily. 10/03/20   Pattricia Boss, MD  levalbuterol [facility] HFA) 45 MCG/ACT inhaler INHALE 1 PUFF INTO THE LUNGS EVERY 6 HOURS AS NEEDED FOR SHORTNESS OF BREATH 09/14/20   Riesa Pope, MD  linaclotide [facility]) 72 MCG capsule TAKE 1 CAPSULE(145 MCG) BY MOUTH DAILY BEFORE BREAKFAST 07/25/20   Angelica Pou, MD  losartan (COZAAR) 50 MG tablet Take 2 tablets (100 mg total) by mouth daily. 09/18/20 01/31/21   Angelica Pou, MD  montelukast (SINGULAIR) 10 MG tablet Take 1 tablet (10 mg total) by mouth at bedtime. 09/19/20   Angelica Pou, MD  pantoprazole (PROTONIX) 40 MG tablet Take 1 tablet (40 mg total) by mouth daily. 07/24/20   Angelica Pou, MD  rivaroxaban (XARELTO) 20 MG TABS tablet Take 1 tablet (20 mg total) by mouth daily with supper. 11/07/19   Sherran Needs, NP  tiotropium (SPIRIVA) 18 MCG inhalation capsule Place 1 capsule (18 mcg total) into inhaler and inhale daily. 04/16/20   Angelica Pou, MD    Allergies    Albuterol, Percocet [oxycodone-acetaminophen], Celecoxib, and Protonix [pantoprazole sodium]  Review of Systems   Review of Systems   10-point review of systems otherwise negative except as noted above in HPI.   Physical Exam Updated Vital Signs Pulse 90  Temp 98.3 F (36.8 C) (Oral)   Resp 20   Ht 5\' 7"  (1.702 m)   Wt 66 kg   SpO2 96%   BMI 22.79 kg/m   Physical Exam   General: Patient appears well. No acute distress. Eyes: Sclera non-icteric. No conjunctival injection.  HENT: Moist mucus membranes. No nasal discharge. Respiratory: Lungs are CTA, bilaterally. No wheezes, rales, or rhonchi. No tachypnea or increased work of breathing on room air.  Cardiovascular: Regular rate and rhythm. No murmurs, rubs, or gallops. No lower extremity edema. Abdominal: LLQ is exquisitely tender to palpation with voluntary guarding. No other abdominal tenderness to palpation. Abdomen is mildly distended but soft. Bowel sounds intact throughout.  Musculoskeletal: There is severe tenderness to palpation over the left lumbar paraspinal muscles and left gluteal region with swelling and increased warmth. There is no bony tenderness to palpation of the left hip or spine. Able to move bilateral lower extremities equally.  Neurological: Patient is alert and oriented x 3. Sensation to light touch intact throughout bilateral lower extremities.    Skin: No  lesions. No rashes. No ecchymosis or erythema.  Psych: Normal affect. Normal tone of voice.   ED Results / Procedures / Treatments   Labs (all labs ordered are listed, but only abnormal results are displayed) Labs Reviewed - No data to display  EKG None  Radiology No results found.  Procedures Procedures {Remember to document critical care time when appropriate:1}  Medications Ordered in ED Medications - No data to display  ED Course  I have reviewed the triage vital signs and the nursing notes.  Pertinent labs & imaging results that were available during my care of the patient were reviewed by me and considered in my medical decision making (see chart for details).    MDM Rules/Calculators/A&P                          [name] is a 70 y.o. lady w/ PMHx notable for A-fib on Xarelto and Diltiazem, presenting due to concern for recurrence of A-fib this morning with frequent PVC's PAC's per EMS that have since improved in the ED. EKG shows NSR. Also with severe left low back pain, left gluteal pain, and LLQ pain.     Final Clinical Impression(s) / ED Diagnoses Final diagnoses:  None    Rx / DC Orders ED Discharge Orders    None     Jeralyn Bennett, MD 10/13/2020, 9:11 AM Pager: [phone]

## 2020-10-17 ENCOUNTER — Ambulatory Visit: Payer: Medicare Other | Admitting: Gastroenterology

## 2020-10-21 ENCOUNTER — Encounter: Payer: Medicare Other | Admitting: Internal Medicine

## 2020-10-22 ENCOUNTER — Encounter: Payer: Self-pay | Admitting: Internal Medicine

## 2020-10-22 ENCOUNTER — Other Ambulatory Visit: Payer: Self-pay | Admitting: Internal Medicine

## 2020-10-22 ENCOUNTER — Telehealth: Payer: Self-pay | Admitting: Internal Medicine

## 2020-10-22 DIAGNOSIS — I700 Atherosclerosis of aorta: Secondary | ICD-10-CM | POA: Insufficient documentation

## 2020-10-22 MED ORDER — HYDROCODONE-ACETAMINOPHEN 5-325 MG PO TABS: ORAL_TABLET | ORAL | 0 refills | Status: DC

## 2020-10-22 NOTE — Telephone Encounter (Signed)
error 

## 2020-10-22 NOTE — Telephone Encounter (Signed)
I called [name] to f/u on her recent ED visit.  We reviewed the CT scan results, which were reassuring, and which showed her known arthritis changes in her back.  She used the hydrocodone sparingly; 1/2 tab prn, not even daily. Prescription was for only 6-7 tabs and are completed - she continues to have pain.  She was instructed by someone not to take tramadol "because it would mess me up".  She is using Icyhot and heating pad with some relief but feels that on occasion the pain is so intense that she considers returning to the ED for relief.  F/u visit with me is scheduled for next week and we will discuss this further.  She is otherwise doing well - no palpitations, problems breathing, or c/o constipation in open ended questioning.

## 2020-10-23 ENCOUNTER — Ambulatory Visit (HOSPITAL_COMMUNITY): Payer: Medicare Other

## 2020-10-29 DIAGNOSIS — Z8639 Personal history of other endocrine, nutritional and metabolic disease: Secondary | ICD-10-CM | POA: Insufficient documentation

## 2020-10-29 NOTE — Progress Notes (Signed)
[name] is here for routine follow-up.  Since our last visit, she was seen in the emergency department for escalating left low back pain and left abdominal pain for which a CT was completed with reassuring results.  She was prescribed a few tablets of hydrocodone which were effective in the short-term and I have provided a refill via a phone call with her subsequently.  Since that time, her pain has improved significantly, and she has only used about 3 to 4 tablets for her pain.  Now that she understands that she has arthritis and will have to live with this pain, she has been much more comfortable using conservative measures.  The opioids have caused constipation and for the most part she is doing well.  She prefers not to proceed with the physical therapy referral and I will cancel this per her request.  Her most significant complaint today is of what she describes as a head cold x 7-10 days-head congestion, significant nasal mucus, postnasal drainage, and worsening breathing.  She has been experiencing shortness of breath and chest congestion, feels that she has been wheezing, and has been using her medic medications as prescribed.  Her Xopenex is prescribed every 6 hours and she is wondering if there is anything she can do between those doses to alleviate her symptoms.  She purchased to OTC cough and cold remedies which caused fluttering her chest (this is not surprising as they contained decongestants).  1 of these was brand-name Mucinex, and she thought she was taking guaifenesin, though there was no guaifenesin in that particular preparation which made her very frustrated because she had spent money for something she did not want.  Constipation.  Taking miralax prn, prn stool softener, and daily linzess  Patient Active Problem List   Diagnosis Date Noted  . H/O hypokalemia 10/29/2020  . Abdominal aortic atherosclerosis ([place]) 10/22/2020  . Seasonal and perennial allergic (presumed) rhinitis  07/25/2020  . Asthma in adult, mild intermittent, uncomplicated   . Chronic bilateral lower abdominal pain 07/24/2020  . 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 ([place]) 07/23/2020  . Subacute left lower back pain 02/14/2020  . Primary osteoarthritis involving multiple joints 06/14/2018  . Osteoporosis without current pathological fracture 04/15/2017  . Constipation, chronic 10/19/2016  . Bilateral hearing loss due to cerumen impaction 09/10/2016  . Hyperlipidemia 03/18/2016  . GERD (gastroesophageal reflux disease)   . Personal history of atrial fibrillation, now s/p ablation in NSR 11/26/2015  . Marijuana abuse 06/11/2015    Current Outpatient Medications:  .  guaifenesin (HUMIBID E) 400 MG TABS tablet, Take 1 tablet (400 mg total) by mouth every 4 (four) hours as needed., Disp: 84 tablet, Rfl: 0 .  Azelastine-Fluticasone 137-50 MCG/ACT SUSP, One spray in each nostril twice a day, every day (Patient taking differently: Place 1 spray into both nostrils daily.), Disp: 23 g, Rfl: 5 .  carbamide peroxide (DEBROX) 6.5 % OTIC solution, Place 5 drops into each ear to soften ear wax the night before and the morning of your next clinic appointment. (Patient not taking: Reported on 10/13/2020), Disp: 15 mL, Rfl: 0 .  cetirizine (ZYRTEC ALLERGY) 10 MG tablet, Take 1 tablet (10 mg total) by mouth daily. (Patient taking differently: Take 10 mg by mouth daily as needed for allergies.), Disp: 30 tablet, Rfl: 2 .  diltiazem (CARDIZEM) 60 MG tablet, Take 1 tablet (60 mg total) by mouth every 6 (six) hours as needed (Rapid AFIB over 100)., Disp: 45 tablet, Rfl: 1 .  diltiazem (CARTIA XT) 120 MG 24 hr capsule, Take 1 capsule (120 mg total) by mouth 2 (two) times daily., Disp: 180 capsule, Rfl: 2 .  fluticasone (FLOVENT HFA) 44 MCG/ACT inhaler, Inhale 1 puff into the lungs daily., Disp: 30.8 g, Rfl: 1 .  gabapentin (NEURONTIN) 100 MG capsule, Take 1 capsule (100 mg total) by mouth 3 (three) times daily.  (Patient not taking: Reported on 10/13/2020), Disp: 15 capsule, Rfl: 0 .  HYDROcodone-acetaminophen (NORCO/VICODIN) 5-325 MG tablet, Take 1/2 tab to 1 tab if needed for days when you are experiencing severe pain., Disp: 21 tablet, Rfl: 0 .  levalbuterol (XOPENEX HFA) 45 MCG/ACT inhaler, INHALE 1 PUFF INTO THE LUNGS EVERY 6 HOURS AS NEEDED FOR SHORTNESS OF BREATH (Patient taking differently: Inhale 1 puff into the lungs every 6 (six) hours as needed for shortness of breath.), Disp: 45 g, Rfl: 1 .  Lidocaine (HM LIDOCAINE PATCH) 4 % PTCH, Apply 1 patch to area overlying worst pain once daily. Replace patch daily., Disp: 30 patch, Rfl: 0 .  linaclotide (LINZESS) 72 MCG capsule, TAKE 1 CAPSULE(145 MCG) BY MOUTH DAILY BEFORE BREAKFAST (Patient not taking: Reported on 10/13/2020), Disp: 90 capsule, Rfl: 5 .  LINZESS 145 MCG CAPS capsule, Take 145 mcg by mouth every morning., Disp: , Rfl:  .  losartan (COZAAR) 50 MG tablet, Take 2 tablets (100 mg total) by mouth daily., Disp: 135 tablet, Rfl: 3 .  montelukast (SINGULAIR) 10 MG tablet, Take 1 tablet (10 mg total) by mouth at bedtime., Disp: 90 tablet, Rfl: 3 .  pantoprazole (PROTONIX) 40 MG tablet, Take 1 tablet (40 mg total) by mouth daily., Disp: 30 tablet, Rfl: 5 .  rivaroxaban (XARELTO) 20 MG TABS tablet, Take 1 tablet (20 mg total) by mouth daily with supper., Disp: 30 tablet, Rfl: 11 .  tiotropium (SPIRIVA) 18 MCG inhalation capsule, Place 1 capsule (18 mcg total) into inhaler and inhale daily., Disp: 90 capsule, Rfl: 1  BP 135/86 (BP Location: Right Arm, Patient Position: Sitting, Cuff Size: Small)   Pulse 87   Temp 98.2 F (36.8 C) (Oral)   Ht 5\' 7"  (1.702 m)   Wt 145 lb 6.4 oz (66 kg)   SpO2 98%   BMI 22.77 kg/m   She appears well today, in no distress.  No cough or wheeze audible during our visit.  Nasal mucosa is chronically erythematous and edematous with narrowed airways.  Posterior pharynx with cobblestoning and clear drainage.  Lungs with  scattered rhonchi and expiratory wheezes predominantly over larger airways, though excellent airflow.  Heart regular rate and rhythm with occasional irregularity/ectopy.  Assessment and Plan (see also problem based documentation)  Viral upper respiratory infection with mild asthma exacerbation: She will continue her breathing medications and may use her Xopenex every 4 hours if needed while she is experiencing the symptoms.  Guaifenesin is ordered and she is advised to continue to avoid over-the-counter decongestants which might elicit palpitations.  Back and abdominal pain is much better which is very reassuring.  Hyperlipidemia and osteoporosis were discussed, see problem based documentation.  BMP ordered today to follow-up hypokalemia identified in the emergency room incidentally.

## 2020-10-29 NOTE — Addendum Note (Signed)
Addended by: Dorian Pod A on: 10/29/2020 02:40 PM   Modules accepted: Orders

## 2020-10-30 ENCOUNTER — Ambulatory Visit (INDEPENDENT_AMBULATORY_CARE_PROVIDER_SITE_OTHER): Payer: Medicare Other | Admitting: Internal Medicine

## 2020-10-30 ENCOUNTER — Telehealth: Payer: Self-pay | Admitting: Internal Medicine

## 2020-10-30 VITALS — BP 135/86 | HR 87 | Temp 98.2°F | Ht 67.0 in | Wt 145.4 lb

## 2020-10-30 DIAGNOSIS — J4520 Mild intermittent asthma, uncomplicated: Secondary | ICD-10-CM

## 2020-10-30 DIAGNOSIS — M5442 Lumbago with sciatica, left side: Secondary | ICD-10-CM | POA: Diagnosis not present

## 2020-10-30 DIAGNOSIS — E785 Hyperlipidemia, unspecified: Secondary | ICD-10-CM

## 2020-10-30 DIAGNOSIS — Z7689 Persons encountering health services in other specified circumstances: Secondary | ICD-10-CM | POA: Diagnosis not present

## 2020-10-30 DIAGNOSIS — Z8639 Personal history of other endocrine, nutritional and metabolic disease: Secondary | ICD-10-CM | POA: Diagnosis not present

## 2020-10-30 DIAGNOSIS — K5909 Other constipation: Secondary | ICD-10-CM

## 2020-10-30 DIAGNOSIS — M810 Age-related osteoporosis without current pathological fracture: Secondary | ICD-10-CM | POA: Diagnosis not present

## 2020-10-30 DIAGNOSIS — I10 Essential (primary) hypertension: Secondary | ICD-10-CM

## 2020-10-30 MED ORDER — VITAMIN D 50 MCG (2000 UT) PO TABS: 2000.0000 [IU] | ORAL_TABLET | Freq: Every day | ORAL | 3 refills | Status: DC

## 2020-10-30 MED ORDER — EZETIMIBE 10 MG PO TABS: 10.0000 mg | ORAL_TABLET | Freq: Every day | ORAL | 11 refills | Status: DC

## 2020-10-30 MED ORDER — GUAIFENESIN 400 MG PO TABS: 400.0000 mg | ORAL_TABLET | ORAL | 0 refills | Status: AC | PRN

## 2020-10-30 MED ORDER — ALENDRONATE SODIUM 70 MG PO TABS: 70.0000 mg | ORAL_TABLET | ORAL | 3 refills | Status: DC

## 2020-10-30 NOTE — Assessment & Plan Note (Signed)
Mild wheezing and chest congestion associated with a likely viral URI.  She continues her tiotropium, Flonase, and as needed Xopenex (albuterol caused palpitations).  Humibid/guaifenesin as prescribed for congestion.  She is advised not to take OTC decongestants unless reviewing with a clinician due to the risk of palpitation side effects in her situation.  Ms. Duling does have a history of significant rhinitis symptoms unrelated to this exacerbation.  She frequently requests antibiotic and we always have a conversation about how this would not be helpful for a virus.  She accepts this reasoning today.

## 2020-10-30 NOTE — Assessment & Plan Note (Signed)
She does not recall being on bisphosphonate in the past and agrees to resume it.  We will also need vitamin D supplementation at 2000 international units daily.  Restart Fosamax.

## 2020-10-30 NOTE — Patient Instructions (Signed)
[name], I was happy to see you today, and to hear that your pain has been better!  We discussed the following:  Back and leg pain - Since you are doing better, and taking the pain medicine so infrequently, you are ok with not doing therapy and prefer not to proceed with this.  Your are doing ok with constipation at this time, using Linzess daily, and as needed Miralax and stool softener.  We discussed trying an alternative cholesterol medicine called Zetia (ezetimibe) and I will send a prescription to your pharmacy.  We will recheck your cholesterol in about 6 months.  We talked about what might have happened to your previous osteoporosis medicine, and neither your I know why it was discontinued.  We talked about the importance of vitamin D, and we will restart the Fosamax, which is once daily.  Finally, you are having problems with a head cold which is causing you to have a lot of congestion and difficulty breathing.  I have prescribed guaifenesin which may help break up the congestion.  If necessary, you may occasionally use your zopenex inhaler every 4 hours (the one in the blue dispenser).    If you have any problems, please get in touch with [name].

## 2020-10-30 NOTE — Assessment & Plan Note (Signed)
Cholesterol treatment reviewed.  In contrast to her report of intolerance last month, today she recalls that she had generalized aching and pain in her muscles "like I had the flu".  This does sound more like a statin induced myalgia.  She agrees to [employer] as an alternative.  Recheck in 6 months.

## 2020-10-30 NOTE — Telephone Encounter (Signed)
Returned call to patient. Explained PCP sent Rx for plain guaifenisen which is OTC. She was appreciative.

## 2020-10-30 NOTE — Telephone Encounter (Signed)
Pt seen today in clinic requesting a call back about a medication that was to be called in today.  Pt unsure of the name of the medication and needs some help.

## 2020-10-30 NOTE — Addendum Note (Signed)
Addended by: Dorian Pod A on: 10/30/2020 03:31 PM   Modules accepted: Orders

## 2020-10-30 NOTE — Assessment & Plan Note (Signed)
She is satisfied with her bowel function currently.  She continues to use her Linzess daily, and uses MiraLAX nearly daily which has been beneficial.  She has noted that her back and abdomen hurt less when her bowels are moving.  In association with constipation and pain has been suspected.

## 2020-10-30 NOTE — Assessment & Plan Note (Signed)
Symptoms have improved.  Topical OTC rubs and heating pad are helpful.  She takes opioid sparingly.  Declines physical therapy given her recent improvement.  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

## 2020-10-30 NOTE — Assessment & Plan Note (Signed)
Low K in ED visit recently; recheck today.

## 2020-10-30 NOTE — Assessment & Plan Note (Signed)
Improved control on increased losartan (100 mg) Appears that her insurance may not cover the 100 mg tab, so she may be stuck with taking 50 mg x 2.

## 2020-10-31 LAB — BMP8+ANION GAP
Anion Gap: 18 mmol/L (ref 10.0–18.0)
BUN/Creatinine Ratio: 11 — ABNORMAL LOW (ref 12–28)
BUN: 7 mg/dL — ABNORMAL LOW (ref 8–27)
CO2: 21 mmol/L (ref 20–29)
Calcium: 9.7 mg/dL (ref 8.7–10.3)
Chloride: 102 mmol/L (ref 96–106)
Creatinine, Ser: 0.61 mg/dL (ref 0.57–1.00)
Glucose: 105 mg/dL — ABNORMAL HIGH (ref 65–99)
Potassium: 3.4 mmol/L — ABNORMAL LOW (ref 3.5–5.2)
Sodium: 141 mmol/L (ref 134–144)
eGFR: 96 mL/min/{1.73_m2} (ref >59)

## 2020-11-03 DIAGNOSIS — R2689 Other abnormalities of gait and mobility: Secondary | ICD-10-CM | POA: Diagnosis not present

## 2020-11-03 IMAGING — US US PELVIS COMPLETE
1 series · 14 of 25 positions shown · non-contrast
Comparison: None

CLINICAL DATA: Postmenopausal bleeding 1 day.

EXAM:
TRANSABDOMINAL AND TRANSVAGINAL ULTRASOUND OF PELVIS
TECHNIQUE: Both transabdominal and transvaginal ultrasound examinations of the
pelvis were performed. Transabdominal technique was performed for
global imaging of the pelvis including uterus, ovaries, adnexal
regions, and pelvic cul-de-sac. It was necessary to proceed with
endovaginal exam following the transabdominal exam to visualize the
endometrium and ovaries.

[Series 1: us pelvis complete · 0.21mm/px · 14 of 51 slices shown]
[im 1/51]
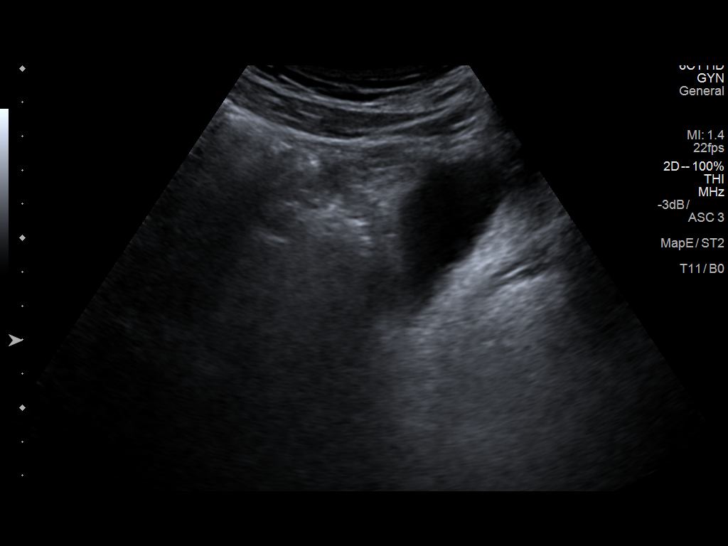
[im 5/51]
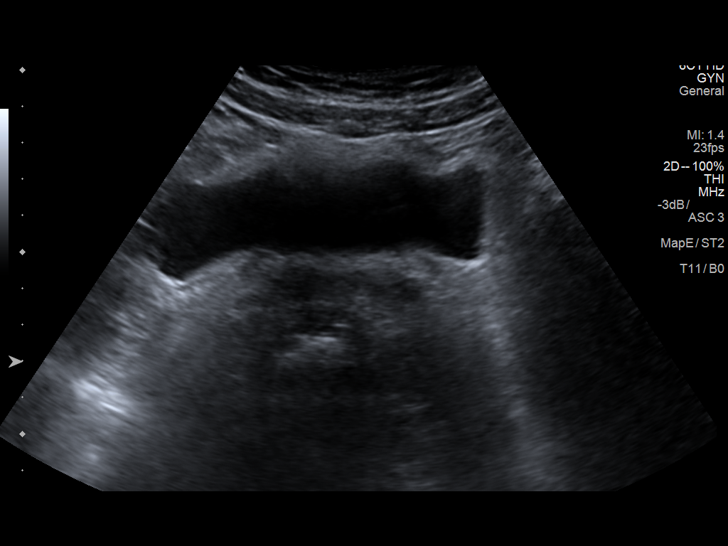
[im 9/51]
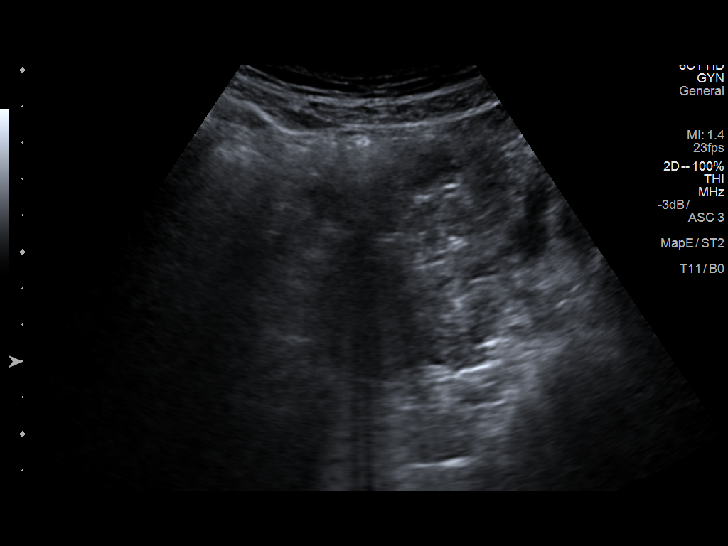
[im 13/51]
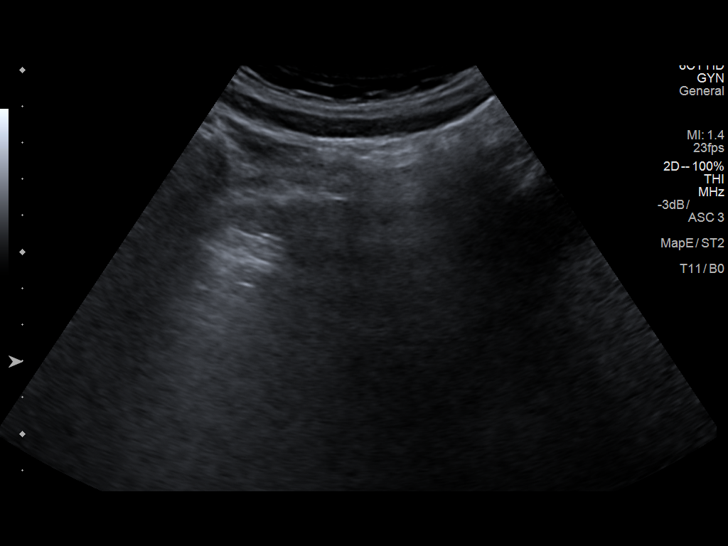
[im 17/51]
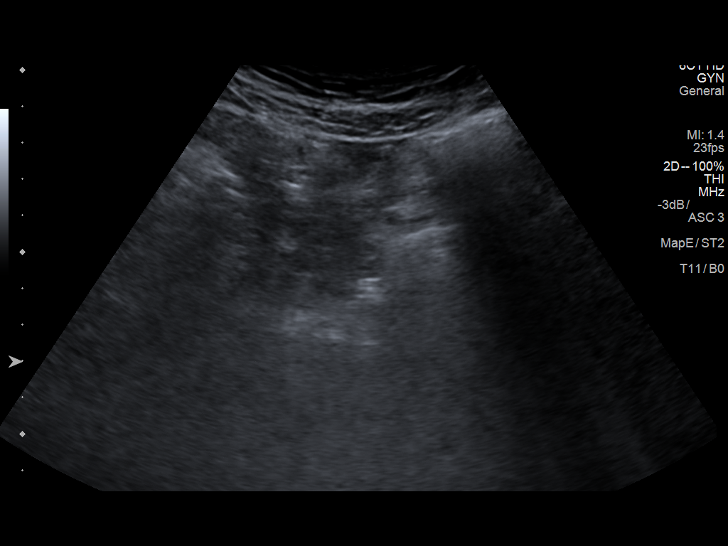
[im 19/51]
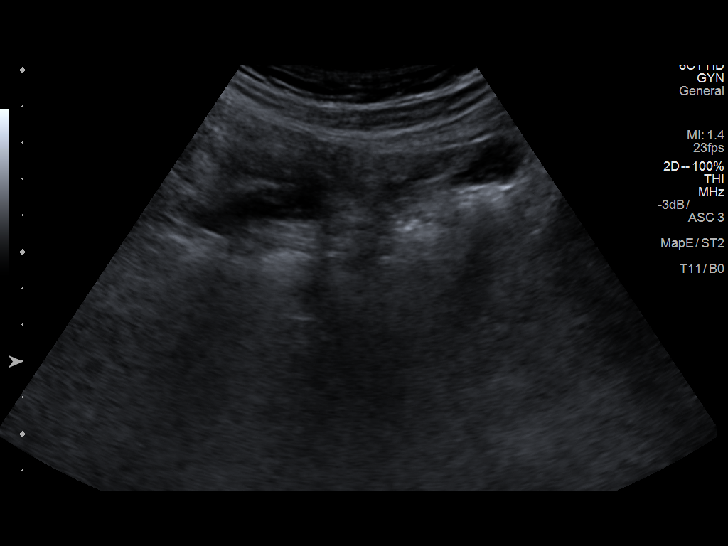
[im 23/51]
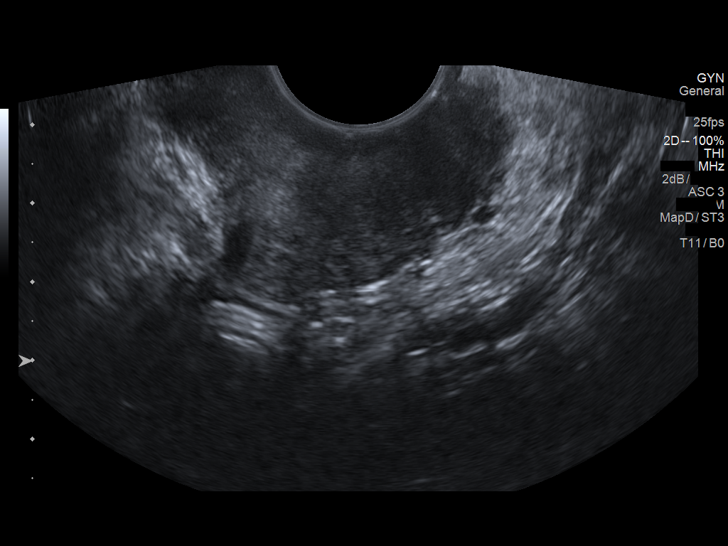
[im 28/51]
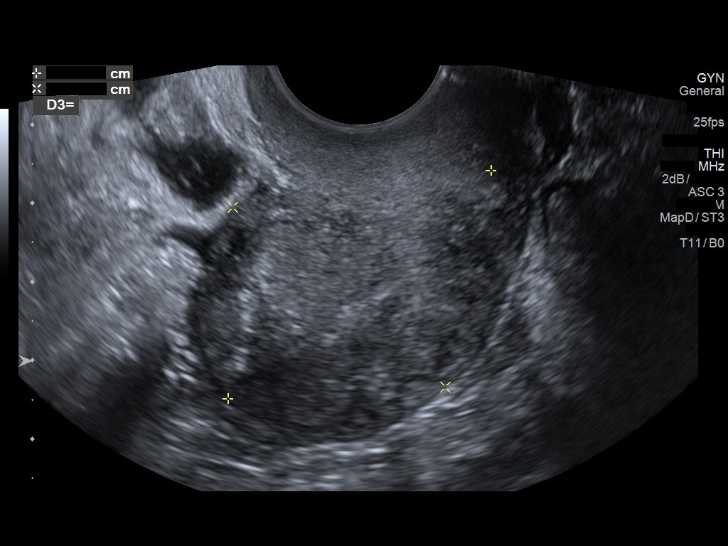
[im 32/51]
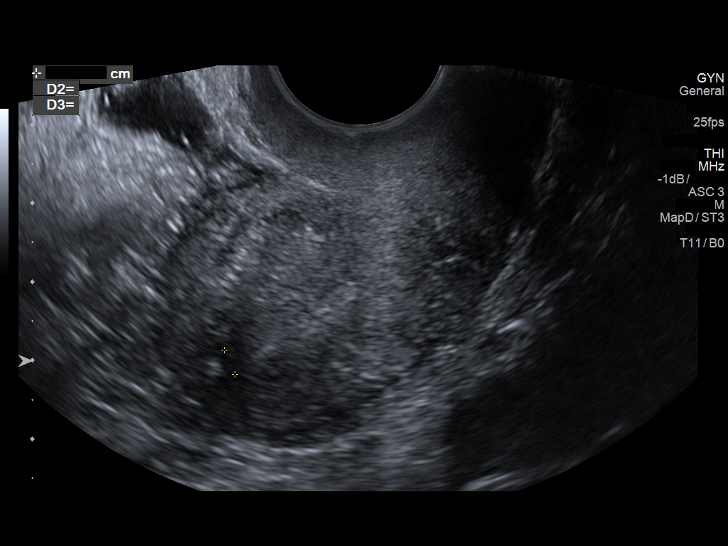
[im 34/51]
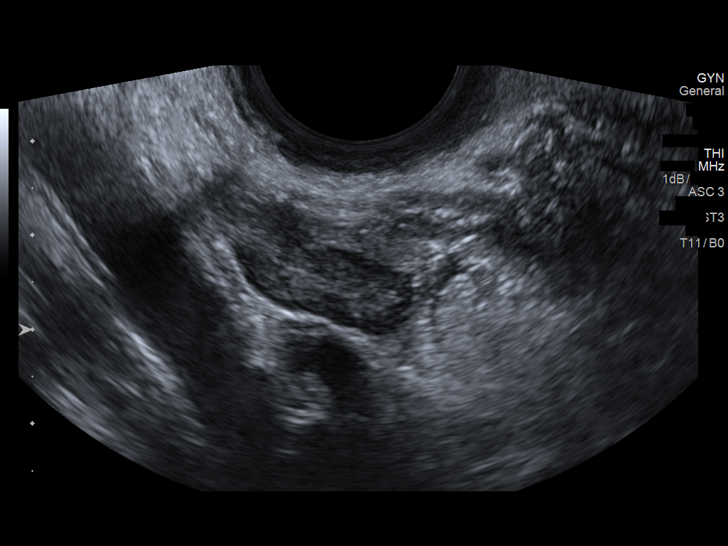
[im 38/51]
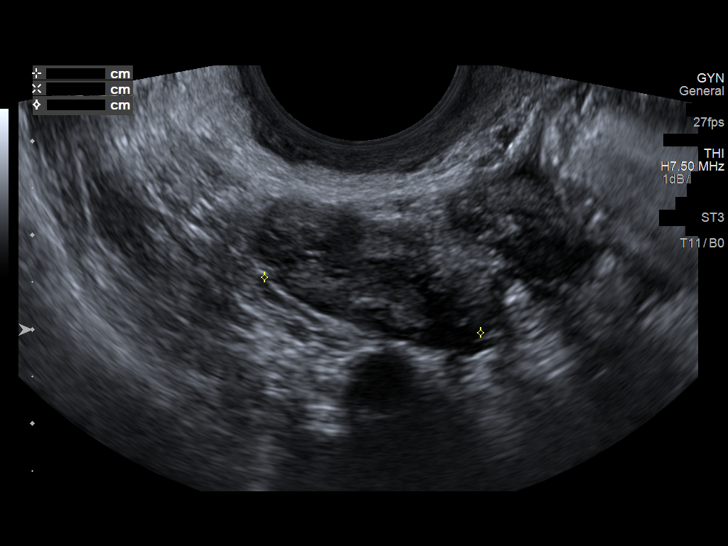
[im 42/51]
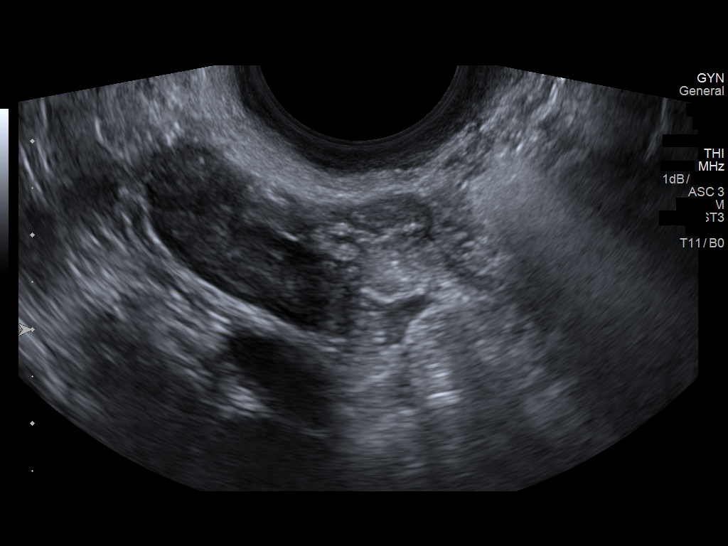
[im 46/51]
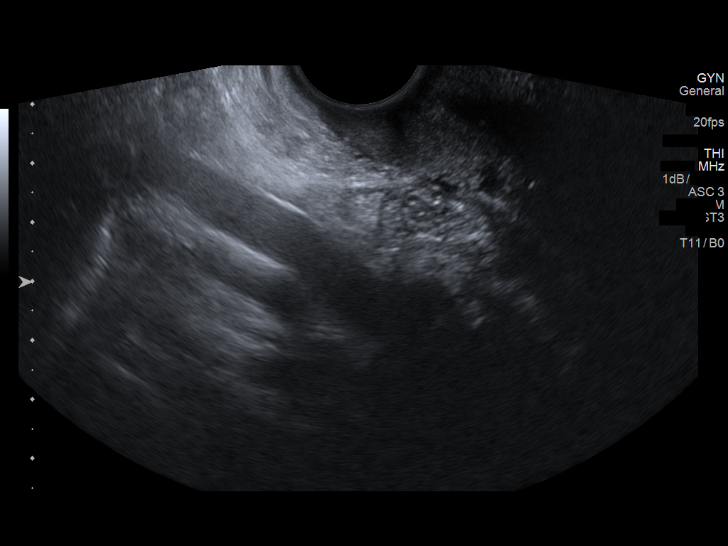
[im 51/51]
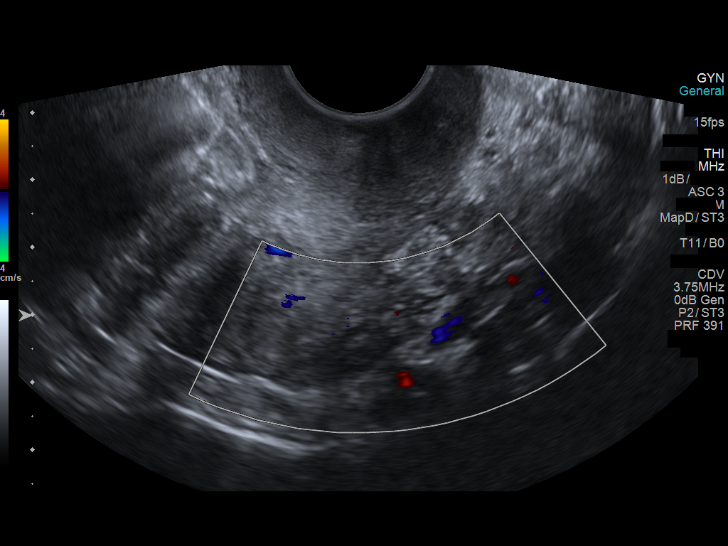

[14 of 25 positions shown; findings below may reference images not displayed]

FINDINGS: Uterus

Measurements: 3.5 x 4.5 x 4.4 cm = volume: 37 mL. No fibroids or
other mass visualized.

Endometrium

Thickness: 3.4 mm.  No focal abnormality visualized.

Right ovary

Measurements: 1.0 x 2.4 x 2.3 cm = volume: 2.8 mL. Normal
appearance/no adnexal mass. Normal color flow.

Left ovary

Measurements: 2.0 x 0.9 x 1.7 cm = volume: 1.6 mL. Normal
appearance/no adnexal mass. Normal color flow.

Other findings

No abnormal free fluid.
IMPRESSION: Normal pelvic ultrasound.  No focal endometrial abnormality.

## 2020-11-04 ENCOUNTER — Telehealth: Payer: Self-pay

## 2020-11-04 NOTE — Telephone Encounter (Signed)
You are correct, it is not the acetaminophen.  She has taken it for a long time without a side effect.    She has done the right thing by using her rescue inhaler.  If she continues to have problems with breathing, please have her come to the clinic (rather than the ED) during office hours so that we can evaluate her situation. She may use the hydrocodone/acetaminophen as she had been (she takes it rather infrequently) if she finds it beneficial for pain control.  I'm sorry to hear she's having a rough time.  Thank you,  Dr. Viona Gilmore.

## 2020-11-04 NOTE — Telephone Encounter (Signed)
Received TC from patient who states she had an asthma attack last night (wheezing) and had to take a dose of her inhaler which happened after she took her hydrocodone-acetaminophen.  She is convinced it is the acetaminophen in the pain medicine causing the wheezing and is requesting a RX for hydrocodone only.  Pt has taken hydrocodone-acetaminophen in the past and tells RN she also takes OTC tylenol prn without problems.  RN explained to patient that acetaminophen and tylenol is the same thing and what she is saying doesn't make sense.  Pt continues to insist the acetaminophen is causing wheezing and wants new RX.  Will forward to PCP to advise. Thanks, [employer]

## 2020-11-04 NOTE — Telephone Encounter (Signed)
Pls contact pt regarding asthma [phone]

## 2020-11-05 ENCOUNTER — Other Ambulatory Visit (HOSPITAL_COMMUNITY): Payer: Self-pay | Admitting: Nurse Practitioner

## 2020-11-05 NOTE — Telephone Encounter (Signed)
THank you for the fu :)

## 2020-11-05 NOTE — Telephone Encounter (Signed)
Patient called and notified of Dr. Jimmye Norman instructions.  Pt states "tell Dr. Jimmye Norman I love her and I think I had problems because I tried to take too much of my medicine at the same time. I will space out my medicine". RN reiterated if she continues to have a need to use rescue inhaler more often to call clinic and make an appt, she verbalized understanding. SChaplin, RN,BSN

## 2020-11-06 ENCOUNTER — Ambulatory Visit: Payer: Medicare Other

## 2020-11-11 ENCOUNTER — Other Ambulatory Visit: Payer: Self-pay

## 2020-11-11 NOTE — Telephone Encounter (Signed)
Need refill on HYDROcodone-acetaminophen (NORCO/VICODIN) 5-325 MG tablet  ;pt contact [employer], [place] AT [address]

## 2020-11-11 NOTE — Telephone Encounter (Signed)
Last rx written 10/22/20. Last OV  10/30/20. Next OV has not been scheduled. UDS has not been done.

## 2020-11-12 NOTE — Telephone Encounter (Signed)
Pls contact pt [phone] regarding pain medicine

## 2020-11-12 NOTE — Telephone Encounter (Signed)
Return pt's call - asking if her pain med has been refilled. Informed pt not yet; she stated ok.

## 2020-11-13 ENCOUNTER — Telehealth: Payer: Self-pay

## 2020-11-13 MED ORDER — HYDROCODONE-ACETAMINOPHEN 5-325 MG PO TABS
ORAL_TABLET | ORAL | 0 refills | Status: DC
Start: 2020-11-13 — End: 2020-12-03

## 2020-11-13 NOTE — Telephone Encounter (Signed)
Pt is requesting  A call back

## 2020-11-13 NOTE — Telephone Encounter (Signed)
There is already an encounter opened from 11/11/20. This encounter closed. SChaplin, RN,BSN

## 2020-11-13 NOTE — Telephone Encounter (Signed)
It wasn't my intention to refill this given her general improvement and sparing use described at last visit. I'm on inpatient right now and time is limited to call and discuss alternatives.  I have to give this some thought.Marland KitchenMarland KitchenMarland Kitchen

## 2020-11-17 ENCOUNTER — Telehealth: Payer: Self-pay

## 2020-11-17 NOTE — Telephone Encounter (Signed)
Pt is requesting a call back she is needing clearance about a cream she wants to use for her pain

## 2020-11-17 NOTE — Telephone Encounter (Signed)
RTC, patient states a nurse has already called her about her question. SChaplin, RN,BSN

## 2020-11-19 ENCOUNTER — Telehealth: Payer: Self-pay | Admitting: *Deleted

## 2020-11-19 NOTE — Telephone Encounter (Signed)
How to say this gently - her arthritis is not the kind a rheumatologist is indicated for.  It is not inflammatory.  We will need to discuss this at next visit.

## 2020-11-19 NOTE — Telephone Encounter (Signed)
Pt calling to request  a  Referral to a Rheumatologist for her arthritis.  Please advise if a referral can be placed.  Pt LOV was 10/30/2020.

## 2020-11-19 NOTE — Telephone Encounter (Signed)
Thank you :)

## 2020-11-19 NOTE — Telephone Encounter (Signed)
Sure. No Problem.  I will be glad to call her and let her know she will need to discuss this at her next appointment.  I had already spoken with her and explained she needed to see her PCP for this Referral but she didn't care to sch an appt.  Thank you for your help with this patient's Request.

## 2020-11-20 ENCOUNTER — Telehealth: Payer: Self-pay | Admitting: Internal Medicine

## 2020-11-20 ENCOUNTER — Telehealth: Payer: Self-pay

## 2020-11-20 DIAGNOSIS — G8929 Other chronic pain: Secondary | ICD-10-CM

## 2020-11-20 DIAGNOSIS — M5442 Lumbago with sciatica, left side: Secondary | ICD-10-CM

## 2020-11-20 NOTE — Telephone Encounter (Signed)
Received another TC from patient, she was asking about Rheumy referral and RN reiterated Dr. Jimmye Norman instructions below, pt verbalized understanding.  Pt asking if she can [name] OTC Omega Fatty-3 pills, biofreeze, and lidocaine cream for her arthritic pain and states her [facility] nurse and pharmacist recommended these.  RN informed patient many OTC treatments are still aimed to treat more of an inflammatory type condition and suggested she wait to talk to MD.  Appt w/ PCP given on 01/01/21 @ 1015.    Pt still wants to know if she can take above meds, RN advised patient to make an appt w/ resident to discuss, she declines. Forwarding to PCP. SChaplin, RN,BSN

## 2020-11-20 NOTE — Telephone Encounter (Signed)
Pt requesting a call back again.  Pt states she has now contacted her [employer] Case Management Nurse and wants to discuss getting some Therapy in her home.

## 2020-11-20 NOTE — Telephone Encounter (Signed)
Addressed in encounter from yesterday. This encounter closed. SChaplin, RN,BSN

## 2020-11-20 NOTE — Telephone Encounter (Signed)
She's a tough one! Yes I had cancelled her previous order as she was feeling better.  IF she is interested again, yes another order will be placed.  Any chance you can take a verbal and send to me for signature? I'm very buys with inpatient but I"ll be back in the clinic tomorrow for the month !!!

## 2020-11-20 NOTE — Telephone Encounter (Signed)
Hi Dr. Jimmye Norman, Looks like there was already a referral place and an appt was made for PT in 09/2020 which was discontinued because she "didn't feel she needed an appt" (please see referrals).  Do you want to place a referral again or wait to see her in clinic to discuss? Also, may I suggest this patient be referred to [facility]?  She seems to be a good candidate if you are in agreement. Thanks! Gid Schoffstall

## 2020-11-20 NOTE — Telephone Encounter (Signed)
Please call pt back about.

## 2020-11-20 NOTE — Telephone Encounter (Signed)
Pt is requesting a callback [phone]

## 2020-11-20 NOTE — Telephone Encounter (Signed)
Patient called in again today and spoke with the triage Nurse and has sch an appt with you for 01/01/2021.

## 2020-11-20 NOTE — Telephone Encounter (Signed)
She can take any of those OTC therapies, they may help.  Anything safe to keep her comfortable while she waits for her appt.

## 2020-11-21 ENCOUNTER — Other Ambulatory Visit: Payer: Self-pay

## 2020-11-21 NOTE — Telephone Encounter (Signed)
RN did speak to pt regarding PT, she states she spoke to her insurance company which told her she would be eligible for in-home PT, but needed letter for PA.  States phone number for information is [phone].  Will forward to MD and if she is in agreement, please place referral and forward to [place] and [place]. Thank you, SChaplin, RN,BSN

## 2020-11-21 NOTE — Telephone Encounter (Signed)
Need refill tiotropium (SPIRIVA) 18 MCG inhalation capsule ;pt contact [place], [place]

## 2020-11-21 NOTE — Telephone Encounter (Signed)
Pt notified. SChaplin, RN,BSN  

## 2020-11-21 NOTE — Telephone Encounter (Signed)
Hi Dr. Jimmye Norman,  She is requesting a referral for in home PT and I'm not sure if she is a candidate for that, I believe that the last time she was referred to PT was as an outpatient.  If you could please refer in home vs outpatient, as you see appropriate. Thank you, Asah Lamay

## 2020-11-24 MED ORDER — TIOTROPIUM BROMIDE MONOHYDRATE 18 MCG IN CAPS: 18.0000 ug | ORAL_CAPSULE | Freq: Every day | RESPIRATORY_TRACT | 1 refills | Status: DC

## 2020-11-24 NOTE — Telephone Encounter (Signed)
  tiotropium (SPIRIVA) 18 MCG inhalation capsule, REFILL REQUEST @  [facility], [place] [address] Phone:  [phone]  Fax:  [phone]     Pt would like a call back.

## 2020-11-27 NOTE — Telephone Encounter (Signed)
Order submitted for home health PT, fyi

## 2020-12-01 ENCOUNTER — Other Ambulatory Visit: Payer: Self-pay

## 2020-12-01 NOTE — Telephone Encounter (Signed)
  HYDROcodone-acetaminophen (NORCO/VICODIN) 5-325 MG tablet, REFILL REQUEST @  [facility], [place] AT [address] Phone:  [phone]  Fax:  [phone]

## 2020-12-03 ENCOUNTER — Other Ambulatory Visit: Payer: Self-pay

## 2020-12-03 MED ORDER — HYDROCODONE-ACETAMINOPHEN 5-325 MG PO TABS: ORAL_TABLET | ORAL | 0 refills | Status: DC

## 2020-12-03 NOTE — Telephone Encounter (Signed)
Return pt's call - requesting a refill on Hydrocodone. Informed pt of Dr Jimmye Norman' response that med was not prescribed as long term.        "Prescription was intended to last at least 3 weeks so no refill at this time. It was not intended to be recurring either."  Pt stated she's in a lot of pain; back pain radiating down to her legs. She wants to know If her doctor can give her a few more pills to last until next appt which is May 12th. Stated she's willing to take a 1/2 tab if she has to. Thanks

## 2020-12-03 NOTE — Telephone Encounter (Signed)
Glenda, Rec'd your page.  I have misgivings about this but have authorized the refill.

## 2020-12-03 NOTE — Telephone Encounter (Signed)
Pt called / informed of refill. Reminded pt to keep her appt on 5/12 stated she will. Also to Dr Jimmye Norman pt stated "thank-you".

## 2020-12-03 NOTE — Telephone Encounter (Signed)
Pt is calling back, wanting to know if the doctor approved  HYDROcodone-acetaminophen (NORCO/VICODIN) 5-325 MG tablet. States she really need this medicine for pain. Please call pt back.

## 2020-12-03 NOTE — Telephone Encounter (Signed)
Pt states noone has called her back regarding  HYDROcodone-acetaminophen (NORCO/VICODIN) 5-325 MG tablet. Please call pt back.

## 2020-12-08 IMAGING — DX DG CHEST 1V PORT
1 series · 1 of 1 positions shown · non-contrast
Comparison: 05/20/2019

CLINICAL DATA: Shortness of breath for 1 week, periodic chest pain

EXAM:
PORTABLE CHEST 1 VIEW

[chest ap]
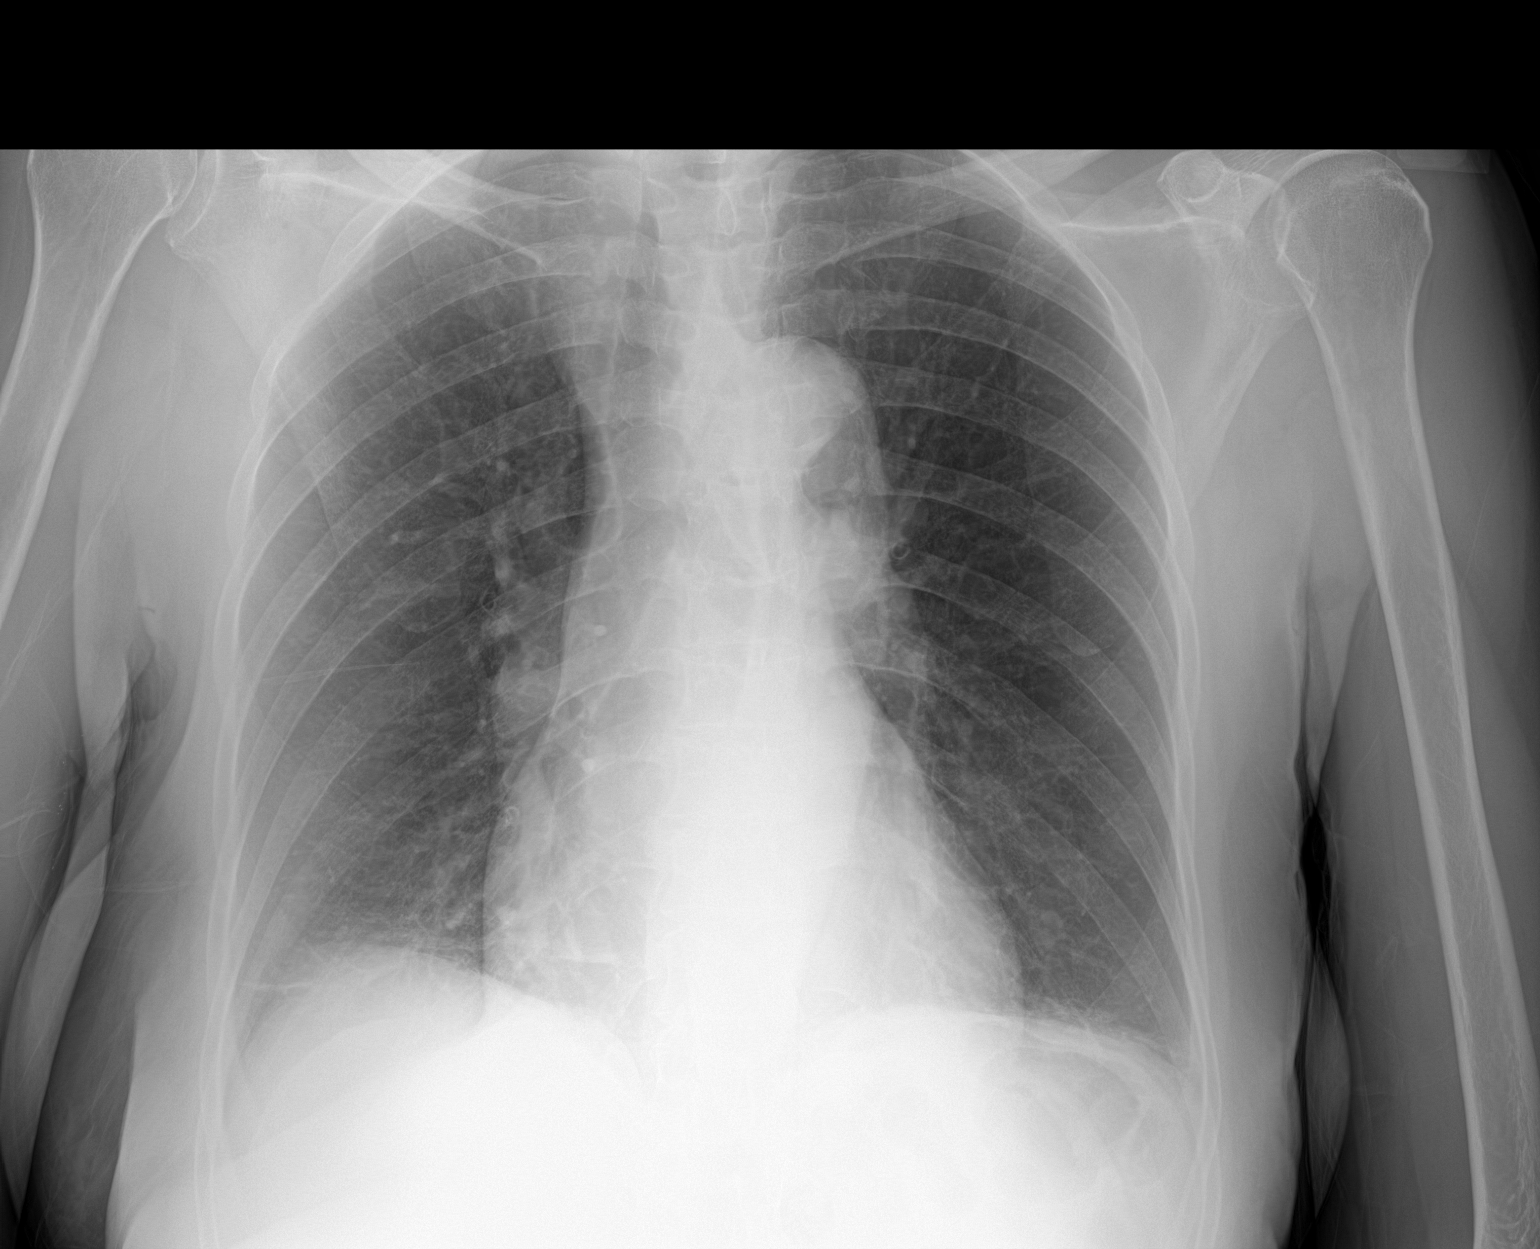

[1 of 1 positions shown; findings below may reference images not displayed]

FINDINGS: Cardiomediastinal contours are stable.

Lungs are clear, aside from some linear opacities at the right lung
base.

Hilar structures are unremarkable.

No signs of effusion.

Visualized skeletal structures are unremarkable.
IMPRESSION: Linear opacities at the right lung base may represent atelectasis or
scarring. Otherwise no active cardiopulmonary disease.

## 2020-12-31 IMAGING — DX DG CHEST 1V PORT
1 series · 1 of 1 positions shown · non-contrast
Comparison: 09/22/2018

CLINICAL DATA: 68-year-old female with a history of chest pain

EXAM:
PORTABLE CHEST 1 VIEW

[chest ap]
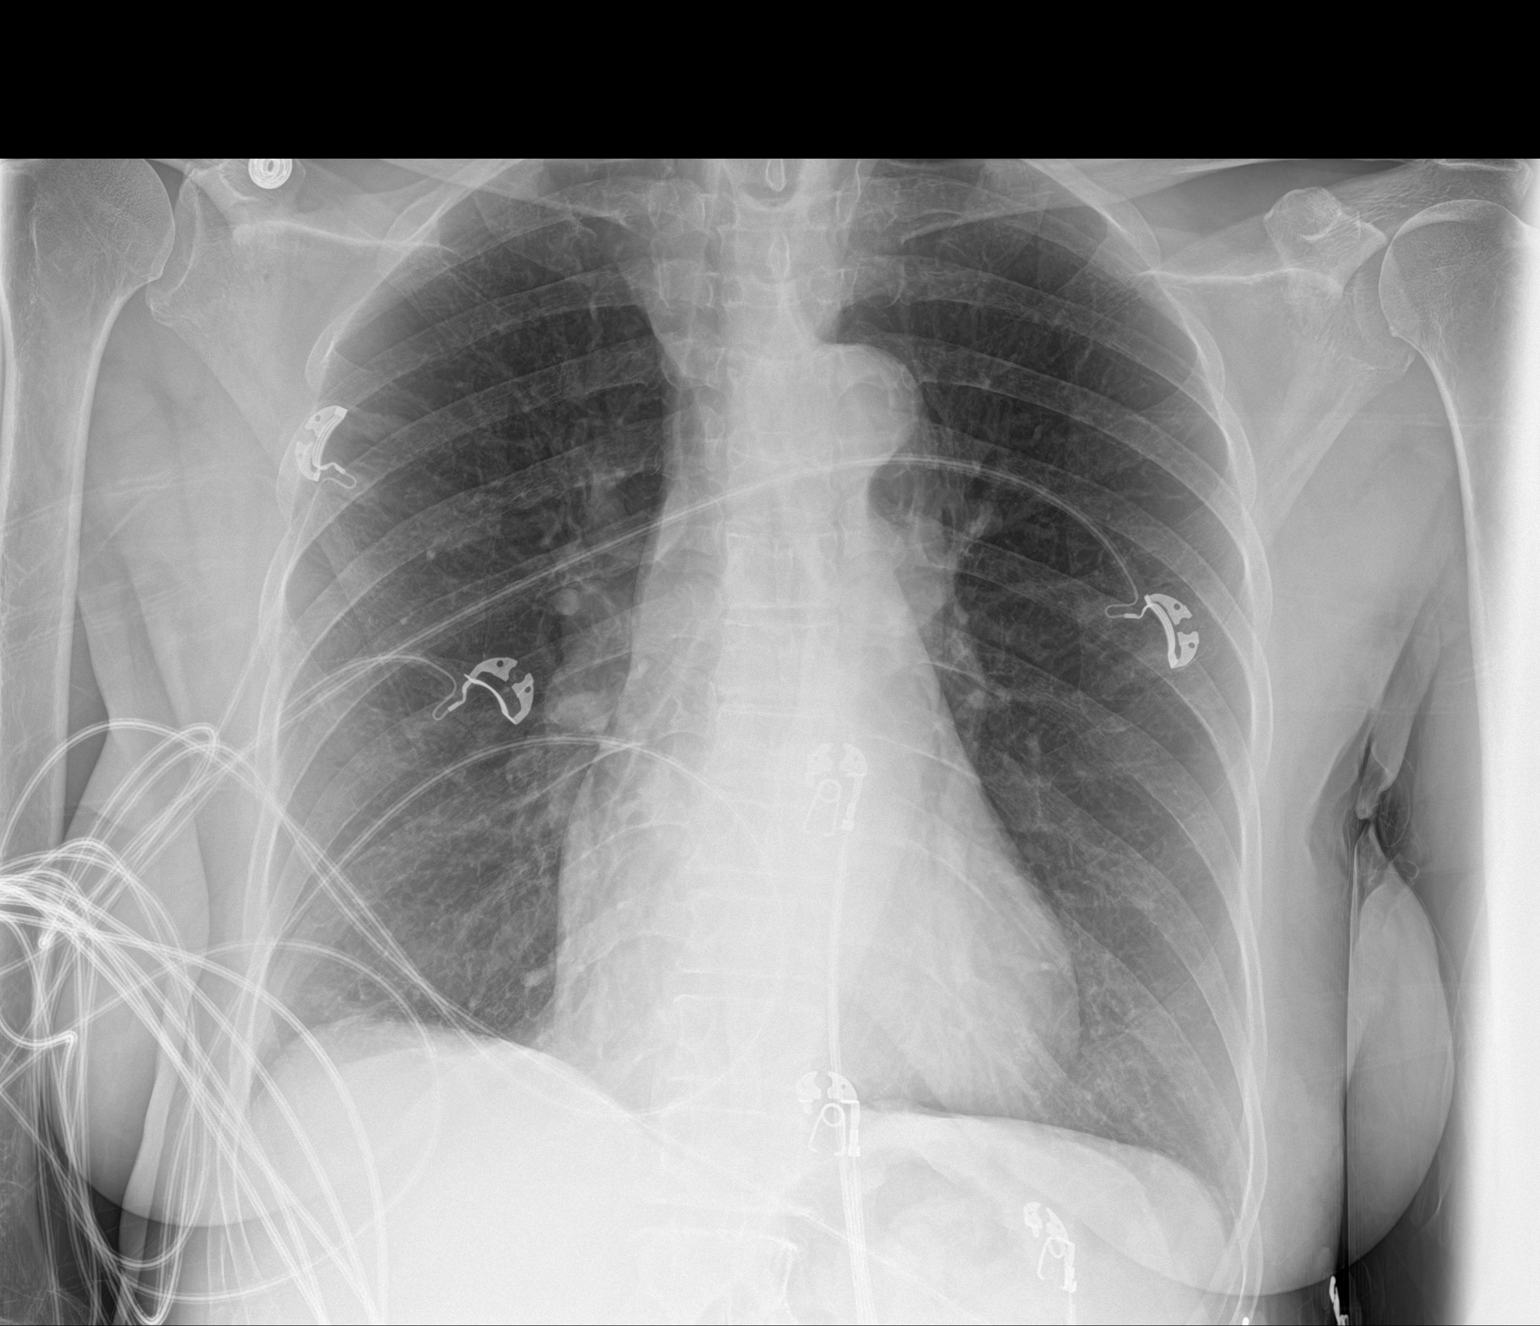

[1 of 1 positions shown; findings below may reference images not displayed]

FINDINGS: Cardiomediastinal silhouette unchanged in size and contour. No
pneumothorax or pleural effusion. No confluent airspace disease.
Coarsened interstitial markings.

No displaced fracture.
IMPRESSION: Negative for acute cardiopulmonary disease

## 2020-12-31 NOTE — Progress Notes (Signed)
She does not recall being on bisphosphonate in the past and agrees to resume it.  We will also need vitamin D supplementation at 2000 international units daily.  Restart Fosamax.  Start zetia (statin myalgia). URI sxs? BAck pain? PT?  Start Vit D No therapy Ok with Zetia and Alendronate URi sxs ok with guaf/dextra

## 2021-01-01 ENCOUNTER — Ambulatory Visit (INDEPENDENT_AMBULATORY_CARE_PROVIDER_SITE_OTHER): Payer: Medicare Other | Admitting: Internal Medicine

## 2021-01-01 VITALS — BP 161/82 | HR 66 | Temp 97.8°F | Ht 67.0 in | Wt 151.9 lb

## 2021-01-01 DIAGNOSIS — G8929 Other chronic pain: Secondary | ICD-10-CM | POA: Diagnosis not present

## 2021-01-01 DIAGNOSIS — M545 Low back pain, unspecified: Secondary | ICD-10-CM

## 2021-01-01 NOTE — Patient Instructions (Signed)
[name], [name] to see you today!  I'm sorry your allergies have been giving you trouble and your back pain is still giving you trouble.  It does sound like things have improved a bit, though, for which I'm grateful!  Today we discussed plan for a daily pain pill.  We will sign an agreement form next visit.  We also talked about starting Vitamin D for strong bones.  This is over the counter.  Please find the 2000 unit dose and take 1 every day.    I hope you have a good summer and look forward to seeing you in 3 months!  Dr. Jimmye Norman

## 2021-01-02 ENCOUNTER — Telehealth: Payer: Self-pay

## 2021-01-02 MED ORDER — HYDROCODONE-ACETAMINOPHEN 5-325 MG PO TABS: ORAL_TABLET | ORAL | 0 refills | Status: DC

## 2021-01-02 NOTE — Telephone Encounter (Signed)
Pt is calling regarding pain medicine [phone]

## 2021-01-02 NOTE — Telephone Encounter (Signed)
Last rx written  12/03/20. Last OV 01/01/21. UDShas not ben done.

## 2021-01-21 NOTE — Addendum Note (Signed)
Addended by: Hulan Fray on: 01/21/2021 05:28 PM   Modules accepted: Orders

## 2021-01-26 ENCOUNTER — Other Ambulatory Visit (HOSPITAL_COMMUNITY): Payer: Self-pay | Admitting: *Deleted

## 2021-01-26 MED ORDER — DILTIAZEM HCL ER COATED BEADS 120 MG PO CP24: 120.0000 mg | ORAL_CAPSULE | Freq: Two times a day (BID) | ORAL | 1 refills | Status: DC

## 2021-02-03 ENCOUNTER — Emergency Department (HOSPITAL_COMMUNITY): Payer: Medicare Other

## 2021-02-03 ENCOUNTER — Other Ambulatory Visit: Payer: Self-pay

## 2021-02-03 ENCOUNTER — Emergency Department (HOSPITAL_COMMUNITY)
Admission: EM | Admit: 2021-02-03 | Discharge: 2021-02-03 | Disposition: A | Discharge status: Home / Self Care | Payer: Medicare Other | Attending: Emergency Medicine | Admitting: Emergency Medicine

## 2021-02-03 DIAGNOSIS — J4520 Mild intermittent asthma, uncomplicated: Secondary | ICD-10-CM | POA: Insufficient documentation

## 2021-02-03 DIAGNOSIS — Z79899 Other long term (current) drug therapy: Secondary | ICD-10-CM | POA: Insufficient documentation

## 2021-02-03 DIAGNOSIS — Z87891 Personal history of nicotine dependence: Secondary | ICD-10-CM | POA: Insufficient documentation

## 2021-02-03 DIAGNOSIS — R112 Nausea with vomiting, unspecified: Secondary | ICD-10-CM

## 2021-02-03 DIAGNOSIS — Z7951 Long term (current) use of inhaled steroids: Secondary | ICD-10-CM | POA: Diagnosis not present

## 2021-02-03 DIAGNOSIS — I10 Essential (primary) hypertension: Secondary | ICD-10-CM | POA: Diagnosis not present

## 2021-02-03 DIAGNOSIS — Z9049 Acquired absence of other specified parts of digestive tract: Secondary | ICD-10-CM | POA: Diagnosis not present

## 2021-02-03 DIAGNOSIS — I700 Atherosclerosis of aorta: Secondary | ICD-10-CM | POA: Diagnosis not present

## 2021-02-03 DIAGNOSIS — R079 Chest pain, unspecified: Secondary | ICD-10-CM | POA: Diagnosis not present

## 2021-02-03 DIAGNOSIS — R1013 Epigastric pain: Secondary | ICD-10-CM | POA: Diagnosis not present

## 2021-02-03 DIAGNOSIS — Z7901 Long term (current) use of anticoagulants: Secondary | ICD-10-CM | POA: Insufficient documentation

## 2021-02-03 DIAGNOSIS — K5730 Diverticulosis of large intestine without perforation or abscess without bleeding: Secondary | ICD-10-CM | POA: Diagnosis not present

## 2021-02-03 DIAGNOSIS — K7689 Other specified diseases of liver: Secondary | ICD-10-CM | POA: Diagnosis not present

## 2021-02-03 LAB — URINALYSIS, ROUTINE W REFLEX MICROSCOPIC
Bilirubin Urine: NEGATIVE
Glucose, UA: NEGATIVE mg/dL
Ketones, ur: NEGATIVE mg/dL
Leukocytes,Ua: NEGATIVE
Nitrite: NEGATIVE
Protein, ur: 100 mg/dL — AB
Specific Gravity, Urine: 1.018 (ref 1.005–1.030)
pH: 5 (ref 5.0–8.0)

## 2021-02-03 LAB — CBC WITH DIFFERENTIAL/PLATELET
Abs Immature Granulocytes: 0.01 10*3/uL (ref 0.00–0.07)
Basophils Absolute: 0 10*3/uL (ref 0.0–0.1)
Basophils Relative: 0 %
Eosinophils Absolute: 0.3 10*3/uL (ref 0.0–0.5)
Eosinophils Relative: 5 %
HCT: 44.1 % (ref 36.0–46.0)
Hemoglobin: 14.1 g/dL (ref 12.0–15.0)
Immature Granulocytes: 0 %
Lymphocytes Relative: 29 %
Lymphs Abs: 1.4 10*3/uL (ref 0.7–4.0)
MCH: 26.1 pg (ref 26.0–34.0)
MCHC: 32 g/dL (ref 30.0–36.0)
MCV: 81.7 fL (ref 80.0–100.0)
Monocytes Absolute: 0.4 10*3/uL (ref 0.1–1.0)
Monocytes Relative: 9 %
Neutro Abs: 2.8 10*3/uL (ref 1.7–7.7)
Neutrophils Relative %: 57 %
Platelets: 274 10*3/uL (ref 150–400)
RBC: 5.4 MIL/uL — ABNORMAL HIGH (ref 3.87–5.11)
RDW: 15.8 % — ABNORMAL HIGH (ref 11.5–15.5)
WBC: 4.9 10*3/uL (ref 4.0–10.5)
nRBC: 0 % (ref 0.0–0.2)

## 2021-02-03 LAB — COMPREHENSIVE METABOLIC PANEL
ALT: 22 U/L (ref 0–44)
AST: 22 U/L (ref 15–41)
Albumin: 4.1 g/dL (ref 3.5–5.0)
Alkaline Phosphatase: 85 U/L (ref 38–126)
Anion gap: 8 (ref 5–15)
BUN: 9 mg/dL (ref 8–23)
CO2: 24 mmol/L (ref 22–32)
Calcium: 10 mg/dL (ref 8.9–10.3)
Chloride: 105 mmol/L (ref 98–111)
Creatinine, Ser: 0.85 mg/dL (ref 0.44–1.00)
GFR, Estimated: >60 mL/min (ref >60)
Glucose, Bld: 118 mg/dL — ABNORMAL HIGH (ref 70–99)
Potassium: 3.3 mmol/L — ABNORMAL LOW (ref 3.5–5.1)
Sodium: 137 mmol/L (ref 135–145)
Total Bilirubin: 0.6 mg/dL (ref 0.3–1.2)
Total Protein: 8 g/dL (ref 6.5–8.1)

## 2021-02-03 LAB — LIPASE, BLOOD: Lipase: 26 U/L (ref 11–51)

## 2021-02-03 LAB — TROPONIN I (HIGH SENSITIVITY)
Troponin I (High Sensitivity): 6 ng/L (ref <18)
Troponin I (High Sensitivity): 7 ng/L (ref <18)

## 2021-02-03 MED ORDER — FAMOTIDINE 20 MG PO TABS: 20.0000 mg | ORAL_TABLET | Freq: Two times a day (BID) | ORAL | 0 refills | Status: DC

## 2021-02-03 MED ORDER — ALUM & MAG HYDROXIDE-SIMETH 400-400-40 MG/5ML PO SUSP: 10.0000 mL | Freq: Four times a day (QID) | ORAL | 0 refills | Status: DC | PRN

## 2021-02-03 MED ORDER — ONDANSETRON 4 MG PO TBDP
4.0000 mg | ORAL_TABLET | Freq: Once | ORAL | Status: AC
  Administered 2021-02-03: 17:00:00 4 mg via ORAL
  Filled 2021-02-03: qty 1

## 2021-02-03 MED ORDER — ONDANSETRON 4 MG PO TBDP: ORAL_TABLET | ORAL | 0 refills | Status: DC

## 2021-02-03 MED ORDER — SODIUM CHLORIDE 0.9 % IV BOLUS
1000.0000 mL | Freq: Once | INTRAVENOUS | Status: AC
  Administered 2021-02-03: 18:00:00 1000 mL via INTRAVENOUS

## 2021-02-03 MED ORDER — FAMOTIDINE IN NACL 20-0.9 MG/50ML-% IV SOLN
20.0000 mg | Freq: Once | INTRAVENOUS | Status: AC
  Administered 2021-02-03: 18:00:00 20 mg via INTRAVENOUS
  Filled 2021-02-03: qty 50

## 2021-02-03 MED ORDER — ONDANSETRON HCL 4 MG/2ML IJ SOLN
4.0000 mg | Freq: Once | INTRAMUSCULAR | Status: AC
  Administered 2021-02-03: 18:00:00 4 mg via INTRAVENOUS
  Filled 2021-02-03: qty 2

## 2021-02-03 MED ORDER — ALUM & MAG HYDROXIDE-SIMETH 200-200-20 MG/5ML PO SUSP
30.0000 mL | Freq: Once | ORAL | Status: AC
  Administered 2021-02-03: 30 mL via ORAL
  Filled 2021-02-03: qty 30

## 2021-02-03 MED ORDER — IOHEXOL 300 MG/ML  SOLN
100.0000 mL | Freq: Once | INTRAMUSCULAR | Status: AC | PRN
  Administered 2021-02-03: 18:00:00 100 mL via INTRAVENOUS

## 2021-02-03 MED ORDER — LIDOCAINE VISCOUS HCL 2 % MT SOLN
15.0000 mL | Freq: Once | OROMUCOSAL | Status: AC
  Administered 2021-02-03: 18:00:00 15 mL via ORAL
  Filled 2021-02-03: qty 15

## 2021-02-03 MED ORDER — PANTOPRAZOLE SODIUM 40 MG PO TBEC: 40.0000 mg | DELAYED_RELEASE_TABLET | Freq: Two times a day (BID) | ORAL | 5 refills | Status: DC

## 2021-02-03 NOTE — ED Notes (Signed)
Unsuccessful x 2 IV attempts, IV team ordered.

## 2021-02-03 NOTE — ED Provider Notes (Signed)
Emergency Medicine Provider Triage Evaluation Note  [name] , a 70 y.o. female  was evaluated in triage.  Pt complains of nausea, vomiting, diarrhea, abdominal pain, chest pain.  She has been having what feels like worsened heartburn symptoms the past few days.  She also has burning when she has bowel movements.  Today she felt like her heart was fluttering, despite her medication, which is what prompted her ER visit.  No fevers.  No cough.  No urinary symptoms.  She does report abdominal cramping however.  Review of Systems  Positive: Palpitations, n/v, abd pain, diarrhea vs burning with BM Negative: Fever, melena  Physical Exam  BP (!) 144/100 (BP Location: Left Arm)   Pulse 93   Temp 98.3 F (36.8 C) (Oral)   Resp 16   SpO2 97%  Gen:   Awake, no distress   Resp:  Normal effort  MSK:   Moves extremities without difficulty  Other:  Ttp of upper abd.   Medical Decision Making  Medically screening exam initiated at 8:00 AM.  Appropriate orders placed.  [name] was informed that the remainder of the evaluation will be completed by another provider, this initial triage assessment does not replace that evaluation, and the importance of remaining in the ED until their evaluation is complete.  Labx, cxr, ekg ordered   Franchot Heidelberg, PA-C 02/03/21 4712    Sherwood Gambler, MD 02/03/21 [phone]

## 2021-02-03 NOTE — ED Notes (Signed)
ED Provider at bedside. Patient passes PO challenge. Denies nausea/deneis pain.

## 2021-02-03 NOTE — ED Notes (Signed)
ED Provider at bedside. 

## 2021-02-03 NOTE — Discharge Instructions (Addendum)
Your lab work and CT scan today were reassuring.  Please treat symptoms supportively with the following medications:  Protonix (pantoprazole): Take 40 mg (1 tablet) twice daily at least 30 minutes before breakfast and dinner.  Pepcid (famotidine): Take 20 mg (1 tablet) twice daily with your Protonix before breakfast and dinner at least for the next 5 days to help calm things down.  Zofran (ondansetron): Take every 4 hours as needed for nausea and vomiting, take medication as soon as you feel nauseous, do not wait until you have started vomiting  Maalox: Use liquid 4 times a day as needed for breakthrough burning, reflux and discomfort  Start with clear liquids and then transition to small frequent meals and then you can slowly return to your regular diet.  I have attached information to your paperwork today on foods to avoid that will worsen your acid reflux and discomfort.  Please call to schedule close follow-up appointment with your primary care doctor.  Return for new or worsening symptoms.

## 2021-02-03 NOTE — ED Provider Notes (Signed)
[facility] EMERGENCY DEPARTMENT Provider Note   CSN: [id number] Arrival date & time: 02/03/21  0740     History Chief Complaint  Patient presents with   Nausea   Emesis   Chest Pain   Abdominal Pain    [name] is a 70 y.o. female.  [name] is a 70 y.o. female with a history of hypertension, GERD, asthma, A. fib on chronic anticoagulation, who presents to the emergency department for evaluation of epigastric pain, nausea, vomiting and chest pain.  Patient reports for the past 3 days she has been having nausea, vomiting, and abdominal pain that reports felt like heartburn, but was not getting better.  She also reports she has had some diarrhea with some burning, no blood in her stools.  Reports the vomiting is continued and she has not been able to keep anything down.  This morning she started to have some heart fluttering and palpitations, had taken her initial 120 mg dose of Cardizem, took an additional half tablet of this and then had some improvement in fluttering, currently denies any palpitations.  Reports that with the vomiting she has had some intermittent chest pains.  She reports it feels like it is radiating up from her epigastric region.  Denies any associated shortness of breath, no lightheadedness or syncope.  No fevers or cough.  No dysuria or urinary frequency.  Pain radiates up from the abdomen to the chest, but does not radiate to her back or anywhere else.  Reports she is taking Protonix regularly but reports this has not been helping with her symptoms over the last few days.  Has not tried anything else to treat her symptoms.  The history is provided by the patient and medical records.      Past Medical History:  Diagnosis Date   Asthma in adult, mild intermittent, uncomplicated    Chronic anticoagulation    Chronic constipation    Eczema    GERD (gastroesophageal reflux disease)    History of kidney stones    Hx of vaginal bleeding,  resolved, ultrasound completed 10/11/2018   Hypertension    Personal history of atrial fibrillation, s/p ablation, now in NSR but remains on anticoaulation 11/26/2015   Followed in cardiology clinic     Patient Active Problem List   Diagnosis Date Noted   H/O hypokalemia 10/29/2020   Abdominal aortic atherosclerosis ([place]) 10/22/2020   Seasonal and perennial allergic (presumed) rhinitis 07/25/2020   Asthma in adult, mild intermittent, uncomplicated    Chronic recurrent bilateral lower abdominal pain 07/24/2020   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 ([place]) 07/23/2020   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 02/14/2020   Primary osteoarthritis involving multiple joints 06/14/2018   Osteoporosis without current pathological fracture 04/15/2017   Constipation, chronic 10/19/2016   Bilateral hearing loss due to cerumen impaction 09/10/2016   Hyperlipidemia 03/18/2016   GERD (gastroesophageal reflux disease)    Personal history of atrial fibrillation, now s/p ablation in NSR 11/26/2015   Marijuana abuse 06/11/2015   Hypertension     Past Surgical History:  Procedure Laterality Date   ATRIAL FIBRILLATION ABLATION N/A 09/30/2017   Procedure: ATRIAL FIBRILLATION ABLATION;  Surgeon: Thompson Grayer, MD;  Location: [place] [facility];  Service: Cardiovascular;  Laterality: N/A;   CHOLECYSTECTOMY N/A 12/08/2018   Procedure: LAPAROSCOPIC CHOLECYSTECTOMY WITH INTRAOPERATIVE CHOLANGIOGRAM;  Surgeon: Jovita Kussmaul, MD;  Location: [place];  Service: General;  Laterality: N/A;   ERCP N/A 12/09/2018  Procedure: ENDOSCOPIC RETROGRADE CHOLANGIOPANCREATOGRAPHY (ERCP);  Surgeon: Carol Ada, MD;  Location: [place];  Service: Endoscopy;  Laterality: N/A;   SPHINCTEROTOMY  12/09/2018   Procedure: SPHINCTEROTOMY;  Surgeon: Carol Ada, MD;  Location: [facility] ENDOSCOPY;  Service: Endoscopy;;   TONSILLECTOMY     TUBAL LIGATION       OB History   No obstetric history on file.     Family  History  Problem Relation Age of Onset   Heart attack Mother        Young age   Breast cancer Sister        Late 27s; now s/p mastectomy    Lung cancer Brother        x 2   Cancer Sister    Cancer Sister    Cancer Sister    Colon cancer Neg Hx    Colon polyps Neg Hx    Esophageal cancer Neg Hx    Rectal cancer Neg Hx    Stomach cancer Neg Hx     Social History   Tobacco Use   Smoking status: Former    Pack years: 0.00    Types: Cigarettes    Quit date: 10/24/2015    Years since quitting: 5.2   Smokeless tobacco: Never  Vaping Use   Vaping Use: Never used  Substance Use Topics   Alcohol use: Not Currently   Drug use: Not Currently    Types: Marijuana    Comment: Smoked marijuana in the past.    Home Medications Prior to Admission medications   Medication Sig Start Date End Date Taking? Authorizing Provider  alum & mag hydroxide-simeth (MAALOX MAX) 400-400-40 MG/5ML suspension Take 10 mLs by mouth every 6 (six) hours as needed for indigestion. 02/03/21  Yes Jacqlyn Larsen, PA-C  ondansetron (ZOFRAN ODT) 4 MG disintegrating tablet 4mg  ODT q4 hours prn nausea/vomit 02/03/21  Yes Lindamarie Maclachlan, Audery Amel, PA-C  alendronate (FOSAMAX) 70 MG tablet Take 1 tablet (70 mg total) by mouth every 7 (seven) days. Take with a full glass of water on an empty stomach. 10/30/20 10/30/21  Angelica Pou, MD  Azelastine-Fluticasone [phone] MCG/ACT SUSP One spray in each nostril twice a day, every day Patient taking differently: Place 1 spray into both nostrils daily. 07/24/20   Angelica Pou, MD  cetirizine (ZYRTEC ALLERGY) 10 MG tablet Take 1 tablet (10 mg total) by mouth daily. Patient taking differently: Take 10 mg by mouth daily as needed for allergies. 09/18/20 09/18/21  Angelica Pou, MD  Cholecalciferol (VITAMIN D) 50 MCG (2000 UT) tablet Take 1 tablet (2,000 Units total) by mouth daily. 10/30/20   Angelica Pou, MD  diltiazem (CARDIZEM) 60 MG tablet TAKE 1 TABLET BY MOUTH  EVERY 6 HOURS AS NEEDED 11/05/20   Sherran Needs, NP  diltiazem (CARTIA XT) 120 MG 24 hr capsule Take 1 capsule (120 mg total) by mouth 2 (two) times daily. 01/26/21   Sherran Needs, NP  ezetimibe (ZETIA) 10 MG tablet Take 1 tablet (10 mg total) by mouth daily. 10/30/20 10/30/21  Angelica Pou, MD  famotidine (PEPCID) 20 MG tablet Take 1 tablet (20 mg total) by mouth 2 (two) times daily. 02/03/21   Jacqlyn Larsen, PA-C  fluticasone (FLOVENT HFA) 44 MCG/ACT inhaler Inhale 1 puff into the lungs daily. 09/18/20   Angelica Pou, MD  HYDROcodone-acetaminophen (NORCO/VICODIN) 5-325 MG tablet Take 1/2 tab to 1 tab if needed for days when you are experiencing severe pain. 01/02/21  Angelica Pou, MD  levalbuterol [facility] HFA) 45 MCG/ACT inhaler INHALE 1 PUFF INTO THE LUNGS EVERY 6 HOURS AS NEEDED FOR SHORTNESS OF BREATH Patient taking differently: Inhale 1 puff into the lungs every 6 (six) hours as needed for shortness of breath. 09/14/20   Katsadouros, Vasilios, MD  Lidocaine (HM LIDOCAINE PATCH) 4 % PTCH Apply 1 patch to area overlying worst pain once daily. Replace patch daily. 10/13/20   Jeralyn Bennett, MD  linaclotide [facility]) 72 MCG capsule TAKE 1 CAPSULE(145 MCG) BY MOUTH DAILY BEFORE BREAKFAST 07/25/20   Angelica Pou, MD  losartan (COZAAR) 50 MG tablet Take 2 tablets (100 mg total) by mouth daily. 09/18/20 01/31/21  Angelica Pou, MD  montelukast (SINGULAIR) 10 MG tablet Take 1 tablet (10 mg total) by mouth at bedtime. 09/19/20   Angelica Pou, MD  pantoprazole (PROTONIX) 40 MG tablet Take 1 tablet (40 mg total) by mouth 2 (two) times daily before a meal. Take 1 tablet (40 mg total) by mouth 2 (two) times daily before breakfast and dinner 02/03/21   Jacqlyn Larsen, PA-C  rivaroxaban (XARELTO) 20 MG TABS tablet Take 1 tablet (20 mg total) by mouth daily with supper. 10/13/20   Sherran Needs, NP  tiotropium (SPIRIVA) 18 MCG inhalation capsule Place 1 capsule (18  mcg total) into inhaler and inhale daily. 11/24/20   Angelica Pou, MD    Allergies    Albuterol, Percocet [oxycodone-acetaminophen], Celecoxib, Protonix [pantoprazole sodium], and Tramadol  Review of Systems   Review of Systems  HENT: Negative.    Skin:  Negative for color change and rash.   Physical Exam Updated Vital Signs BP (!) 152/97   Pulse 74   Temp 98.3 F (36.8 C) (Oral)   Resp 16   SpO2 99%   Physical Exam Vitals and nursing note reviewed.  Constitutional:      General: She is not in acute distress.    Appearance: Normal appearance. She is well-developed and normal weight. She is not ill-appearing or diaphoretic.     Comments: Well-appearing and in no distress   HENT:     Head: Normocephalic and atraumatic.  Eyes:     General:        Right eye: No discharge.        Left eye: No discharge.     Pupils: Pupils are equal, round, and reactive to light.  Cardiovascular:     Rate and Rhythm: Normal rate and regular rhythm.     Pulses: Normal pulses.          Radial pulses are 2+ on the right side and 2+ on the left side.     Heart sounds: Normal heart sounds.  Pulmonary:     Effort: Pulmonary effort is normal. No respiratory distress.     Breath sounds: Normal breath sounds. No wheezing or rales.     Comments: Respirations equal and unlabored, patient able to speak in full sentences, lungs clear to auscultation bilaterally  Chest:     Chest wall: No tenderness.  Abdominal:     General: Bowel sounds are normal. There is no distension.     Palpations: Abdomen is soft. There is no mass.     Tenderness: There is abdominal tenderness. There is no guarding or rebound.     Comments: Abdomen soft, nondistended, bowel sounds present throughout, some generalized tenderness noted, worse in the epigastrium, no guarding or rebound tenderness  Musculoskeletal:  General: No deformity.     Cervical back: Neck supple.     Right lower leg: No tenderness. No edema.      Left lower leg: No tenderness. No edema.  Skin:    General: Skin is warm and dry.     Capillary Refill: Capillary refill takes less than 2 seconds.  Neurological:     Mental Status: She is alert and oriented to person, place, and time.     Coordination: Coordination normal.     Comments: Speech is clear, able to follow commands CN III-XII intact Normal strength in upper and lower extremities bilaterally including dorsiflexion and plantar flexion, strong and equal grip strength Sensation normal to light and sharp touch Moves extremities without ataxia, coordination intact  Psychiatric:        Mood and Affect: Mood normal.        Behavior: Behavior normal.    ED Results / Procedures / Treatments   Labs (all labs ordered are listed, but only abnormal results are displayed) Labs Reviewed  CBC WITH DIFFERENTIAL/PLATELET - Abnormal; Notable for the following components:      Result Value   RBC 5.40 (*)    RDW 15.8 (*)    All other components within normal limits  COMPREHENSIVE METABOLIC PANEL - Abnormal; Notable for the following components:   Potassium 3.3 (*)    Glucose, Bld 118 (*)    All other components within normal limits  URINALYSIS, ROUTINE W REFLEX MICROSCOPIC - Abnormal; Notable for the following components:   APPearance HAZY (*)    Hgb urine dipstick SMALL (*)    Protein, ur 100 (*)    Bacteria, UA RARE (*)    All other components within normal limits  LIPASE, BLOOD  TROPONIN I (HIGH SENSITIVITY)  TROPONIN I (HIGH SENSITIVITY)    EKG EKG Interpretation  Date/Time:  Tuesday February 03 2021 07:40:37 EDT Ventricular Rate:  91 PR Interval:  116 QRS Duration: 84 QT Interval:  364 QTC Calculation: 447 R Axis:   61 Text Interpretation: Sinus rhythm with occasional Premature ventricular complexes Otherwise normal ECG No significant change since last tracing Confirmed by Blanchie Dessert [id number]) on 02/03/2021 6:00:15 PM  Radiology DG Chest 2 View  Result Date:  02/03/2021 CLINICAL DATA:  Chest pain. EXAM: CHEST - 2 VIEW COMPARISON:  09/12/2020. FINDINGS: The heart size and mediastinal contours are within normal limits. Both lungs are clear. No visible pleural effusions or pneumothorax. No acute osseous abnormality. IMPRESSION: No evidence of acute cardiopulmonary disease. Electronically Signed   By: Margaretha Sheffield MD   On: 02/03/2021 08:34   CT Abdomen Pelvis W Contrast  Result Date: 02/03/2021 CLINICAL DATA:  70 year old female with epigastric pain. EXAM: CT ABDOMEN AND PELVIS WITH CONTRAST TECHNIQUE: Multidetector CT imaging of the abdomen and pelvis was performed using the standard protocol following bolus administration of intravenous contrast. CONTRAST:  131mL OMNIPAQUE IOHEXOL 300 MG/ML  SOLN COMPARISON:  CT abdomen pelvis dated 10/13/2020. FINDINGS: Lower chest: There is a 5 mm right lung base subpleural nodule (5/6). The visualized lung bases are otherwise clear. No intra-abdominal free air or free fluid. Hepatobiliary: Several small liver cysts and additional subcentimeter hypodensities which are too small to characterize. Similar appearance of a faint which shaped hypoenhancement along the inferior right lobe of the liver (28/3). No intrahepatic biliary dilatation. Cholecystectomy. No retained calcified stone noted in the central CBD. Pancreas: Unremarkable. No pancreatic ductal dilatation or surrounding inflammatory changes. Spleen: Normal in size without focal abnormality. Adrenals/Urinary  Tract: The adrenal glands unremarkable. There is no hydronephrosis on either side. There is symmetric enhancement and excretion of contrast by both kidneys. The visualized ureters and urinary bladder appear unremarkable. Stomach/Bowel: Several small scattered colonic diverticula without active inflammatory changes. There is no bowel obstruction or active inflammation. The appendix is unremarkable as visualized. Vascular/Lymphatic: Advanced aortoiliac atherosclerotic  disease. A 2.7 cm infrarenal aortic ectasia. The IVC is unremarkable. No portal venous gas. There is no adenopathy.1 Reproductive: The uterus and ovaries are grossly unremarkable. No adnexal masses Other: None Musculoskeletal: Osteopenia with degenerative changes of the spine. Multilevel disc desiccation and vacuum phenomena. No acute osseous pathology. IMPRESSION: 1. No acute intra-abdominal or pelvic pathology. 2. Small scattered colonic diverticula. No bowel obstruction. Normal appendix. 3. There is a 5 mm right lung base subpleural nodule. 4. Aortic Atherosclerosis (ICD10-I70.0). Electronically Signed   By: Anner Crete M.D.   On: 02/03/2021 18:41    Procedures Procedures   Medications Ordered in ED Medications  ondansetron (ZOFRAN-ODT) disintegrating tablet 4 mg (4 mg Oral Given 02/03/21 1631)  sodium chloride 0.9 % bolus 1,000 mL (0 mLs Intravenous Stopped 02/03/21 1902)  ondansetron (ZOFRAN) injection 4 mg (4 mg Intravenous Given 02/03/21 1745)  famotidine (PEPCID) IVPB 20 mg premix (0 mg Intravenous Stopped 02/03/21 1818)  alum & mag hydroxide-simeth (MAALOX/MYLANTA) 200-200-20 MG/5ML suspension 30 mL (30 mLs Oral Given 02/03/21 1733)    And  lidocaine (XYLOCAINE) 2 % viscous mouth solution 15 mL (15 mLs Oral Given 02/03/21 1733)  iohexol (OMNIPAQUE) 300 MG/ML solution 100 mL (100 mLs Intravenous Contrast Given 02/03/21 1822)    ED Course  I have reviewed the triage vital signs and the nursing notes.  Pertinent labs & imaging results that were available during my care of the patient were reviewed by me and considered in my medical decision making (see chart for details).    MDM Rules/Calculators/A&P                         71 y.o. female presents to the ED with complaints of epigastric abdominal pain, chest pain, nausea, vomiting, this involves an extensive number of treatment options, and is a complaint that carries with it a high risk of complications and morbidity.  The differential  diagnosis includes ACS, GERD, gastritis, PUD, cholecystitis, pancreatitis, bowel obstruction, pneumonia, PE, arrhythmia, musculoskeletal pain  On arrival pt is nontoxic, vitals significant for mild hypertension but otherwise unremarkable. Exam significant for generalized abdominal tenderness most notable in the epigastric region, no guarding or rebound tenderness  Additional history obtained from chart review. Previous records obtained and reviewed via EMR  I ordered for IV fluids, Pepcid, Zofran and GI cocktail for symptom management  Lab Tests:  I Ordered, reviewed, and interpreted labs, which included:  CBC: No leukocytosis, normal hemoglobin CMP: Potassium 3.3, p.o. potassium replacement given, glucose 118, no other electrolyte derangements, normal renal and liver function Lipase: WNL Troponin: Negative x2 UA: Some hematuria noted, but no other signs of infection.  No urinary symptoms  Imaging Studies ordered:  I ordered imaging studies which included chest x-ray, CT abdomen pelvis, I independently visualized and interpreted imaging which showed: CXR: No active cardiopulmonary disease. CT: No acute intra-abdominal or pelvic pathology, small scattered chronic diverticula, no evidence of diverticulitis, no bowel obstruction, normal appendix, 5 mm right lung base nodule noted.  ED Course:   Patient's work-up is overall been very reassuring, and evaluation after medications patient is feeling much better, no  further vomiting and she reports nausea has resolved, she has been able to tolerate p.o. here in the ED with no further emesis.  Will increase patient's medication regimen for GERD to Protonix 40 mg twice daily, will also have her use Zofran as needed, as well as a few days of additional Pepcid and Maalox for breakthrough symptoms.  Encourage patient to follow-up closely within the next few days with her PCP.  Strict return precautions discussed.  Patient expresses understanding and  agreement.  Discharged home in good condition.    Portions of this note were generated with Lobbyist. Dictation errors may occur despite best attempts at proofreading.   Final Clinical Impression(s) / ED Diagnoses Final diagnoses:  Non-intractable vomiting with nausea, unspecified vomiting type  Epigastric pain    Rx / DC Orders ED Discharge Orders          Ordered    pantoprazole (PROTONIX) 40 MG tablet  2 times daily before meals        02/03/21 1905    ondansetron (ZOFRAN ODT) 4 MG disintegrating tablet        02/03/21 1905    famotidine (PEPCID) 20 MG tablet  2 times daily,   Status:  Discontinued        02/03/21 1905    alum & mag hydroxide-simeth (MAALOX MAX) 244-010-27 MG/5ML suspension  Every 6 hours PRN        02/03/21 1905    famotidine (PEPCID) 20 MG tablet  2 times daily        02/03/21 1912             Jacqlyn Larsen, Hershal Coria 02/06/21 1245    Blanchie Dessert, MD 02/06/21 1411

## 2021-02-03 NOTE — Progress Notes (Signed)
Arrived to room to start IV but ED RN stated "I got it". IV already in.

## 2021-02-03 NOTE — ED Triage Notes (Signed)
Pt presents with epigastric pain, N/V  reports dx with acid reflux in December, only takes her prescribed pantoprazole as needed, rx says take daily, pt states "that doesn't work for me."

## 2021-02-06 ENCOUNTER — Other Ambulatory Visit: Payer: Self-pay

## 2021-02-06 MED ORDER — FAMOTIDINE 20 MG PO TABS: 20.0000 mg | ORAL_TABLET | Freq: Two times a day (BID) | ORAL | 0 refills | Status: DC

## 2021-02-06 NOTE — Telephone Encounter (Signed)
  famotidine (PEPCID) 20 MG tablet, refill request @  [facility], [place] [address] Phone:  [phone]  Fax:  [phone]

## 2021-02-06 NOTE — Telephone Encounter (Signed)
Hi Dr. Jimmye Norman,  Please see recent ED visit from 6/14, looks like on her ED discharge summary she was started on Pepcid and told to take this with her Protonix.  She was given a 5 day course.  Pt requesting refill on the Pepcid.  Please advise on this. Thank you, Brandalyn Harting

## 2021-02-09 IMAGING — DX DG CHEST 2V
2 series · 2 of 2 positions shown · non-contrast
Comparison: 09/15/2019 chest radiograph.

CLINICAL DATA: Dyspnea

EXAM:
CHEST - 2 VIEW

[w chest pa]
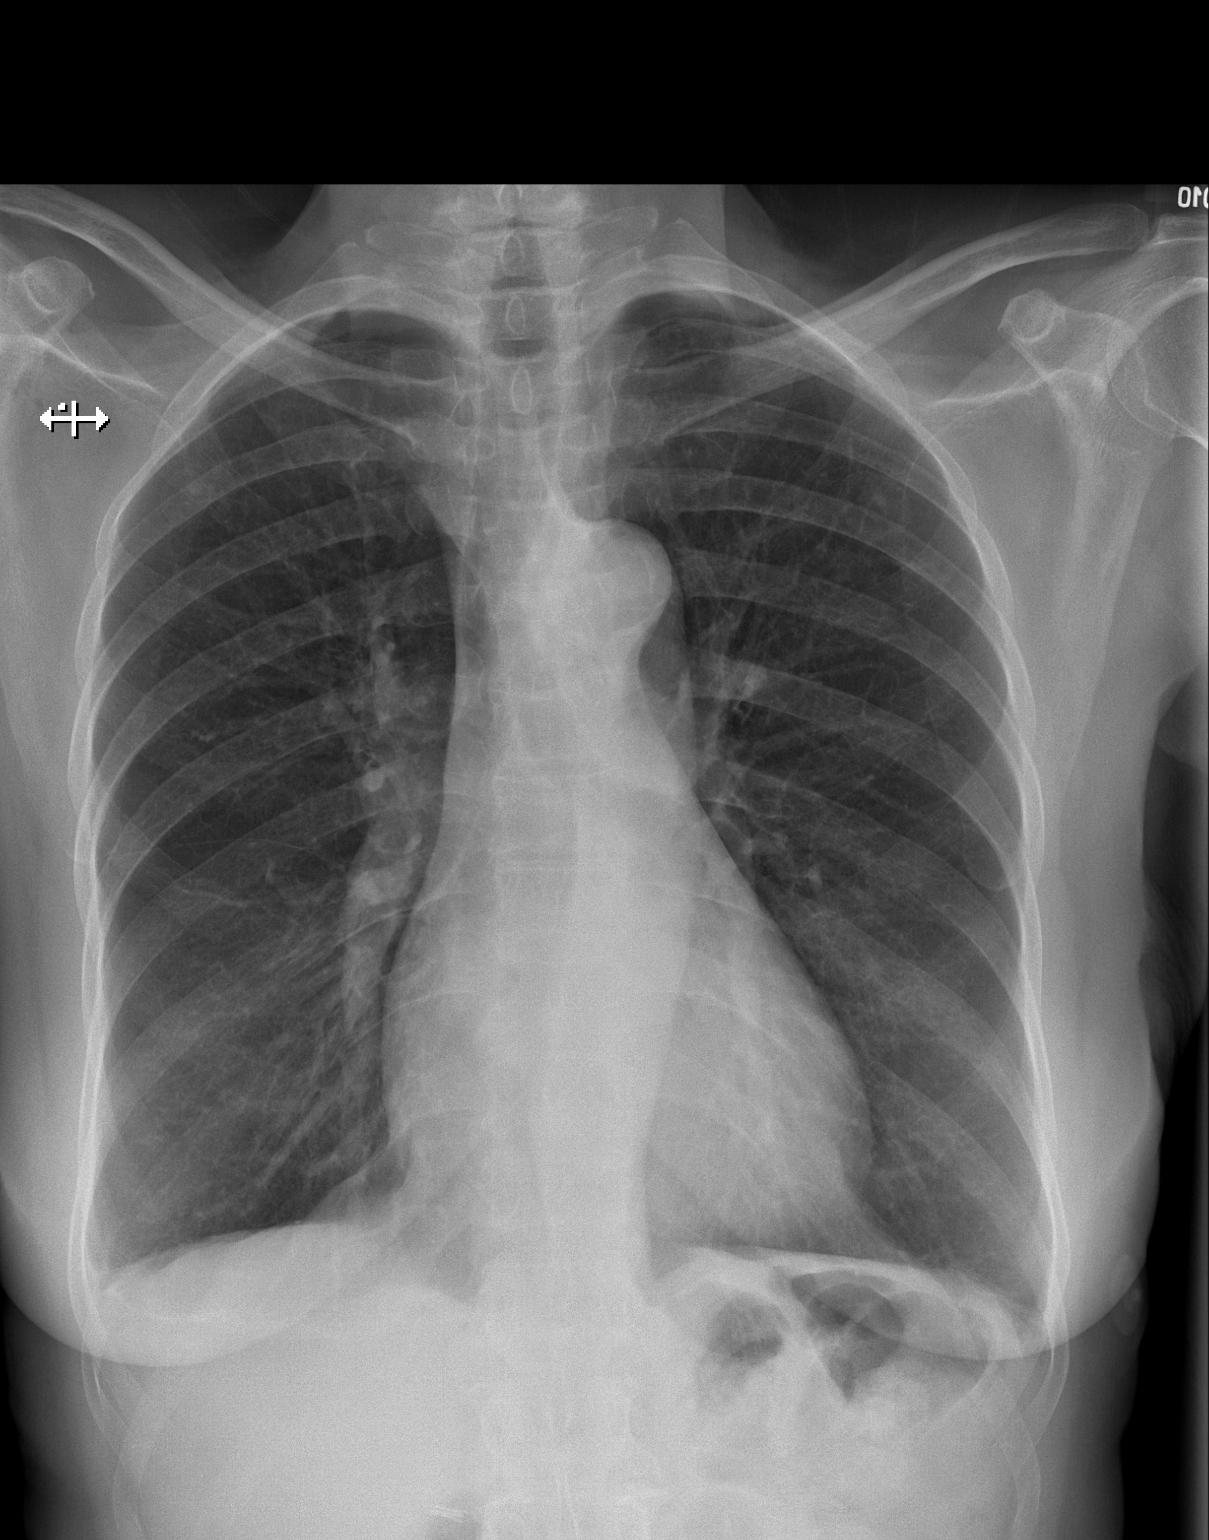

[w chest lat]
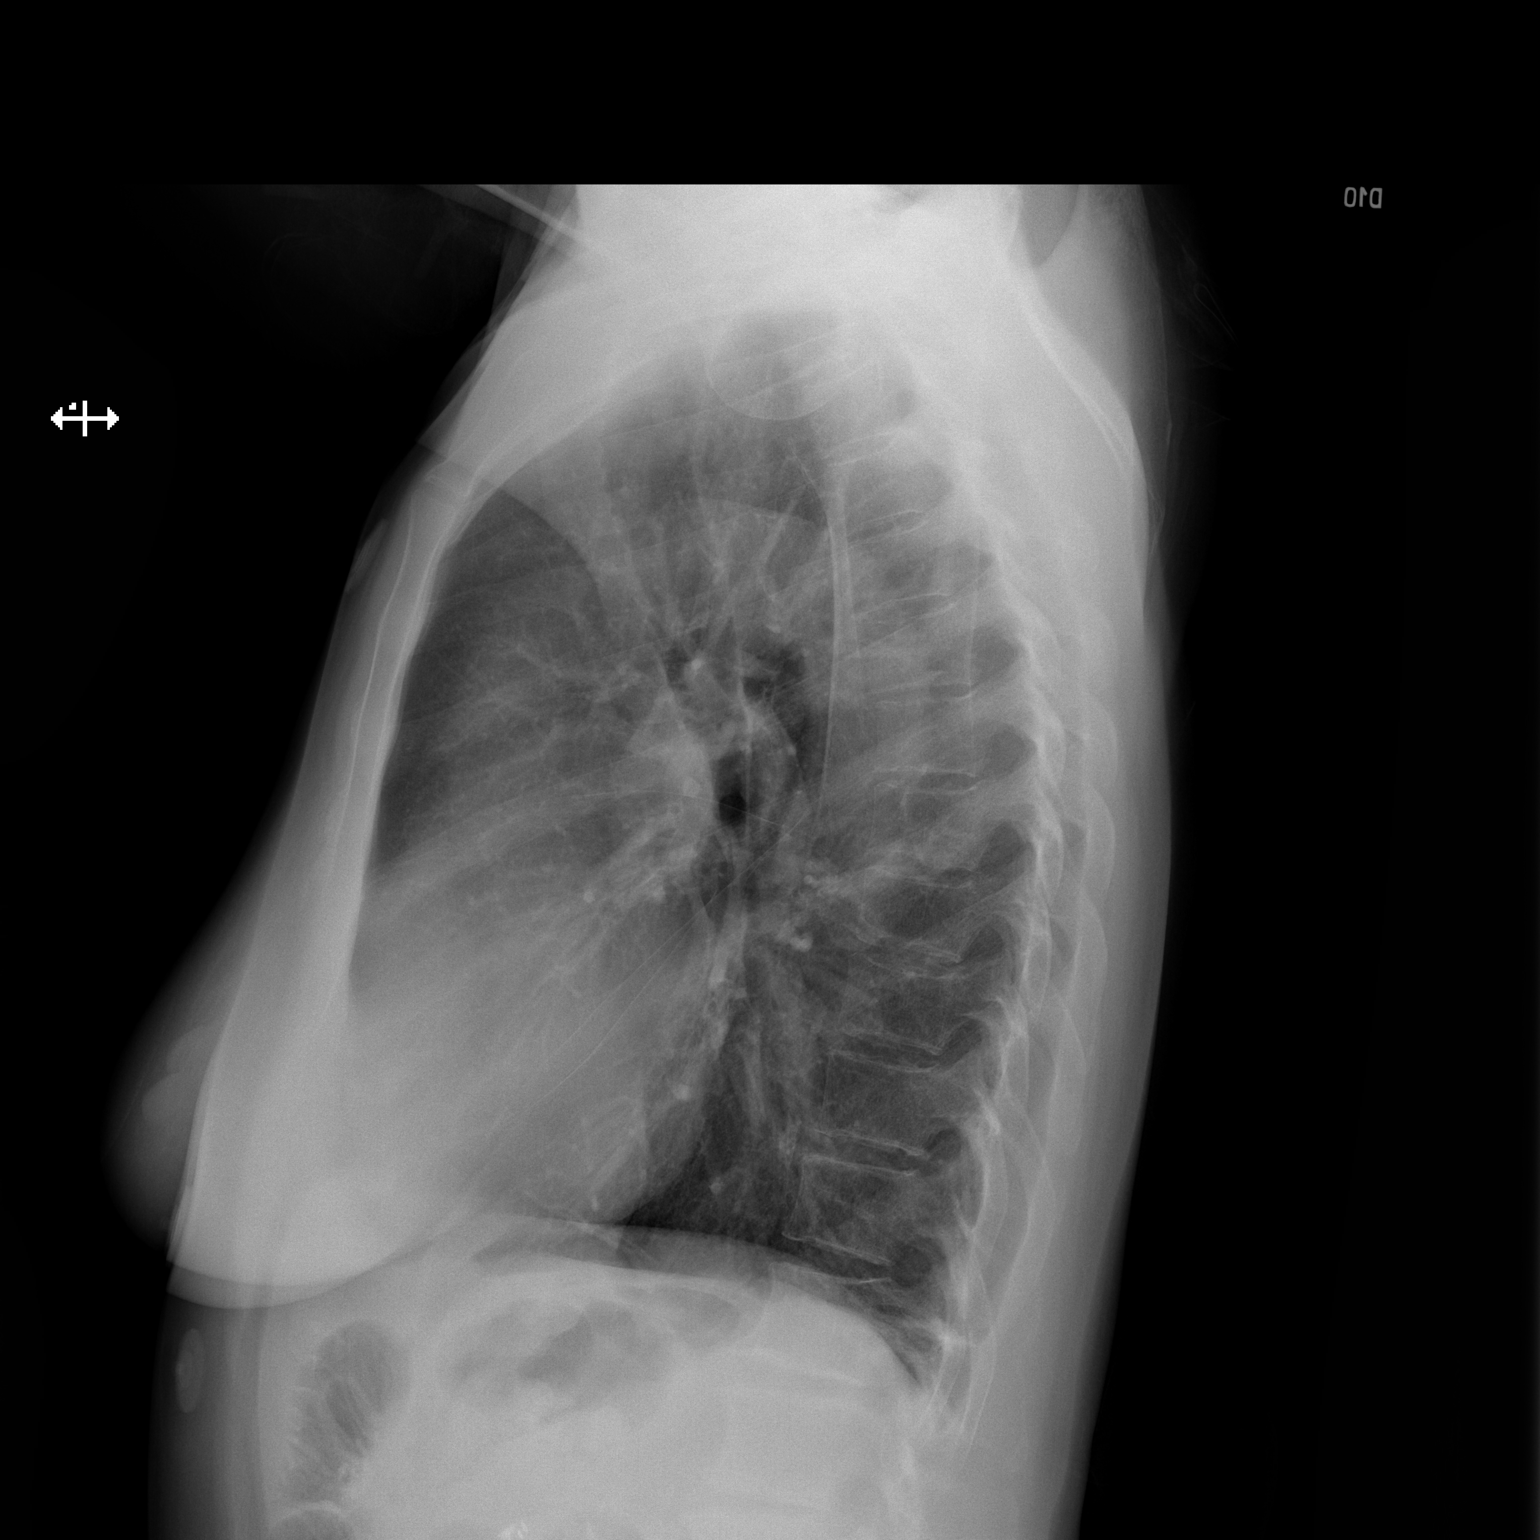

[2 of 2 positions shown; findings below may reference images not displayed]

FINDINGS: Stable cardiomediastinal silhouette with normal heart size. No
pneumothorax. No pleural effusion. Mildly hyperinflated lungs. No
pulmonary edema. No acute consolidative airspace disease.
IMPRESSION: 1. Mildly hyperinflated lungs, suggesting obstructive lung disease.
2. Otherwise no active cardiopulmonary disease.

## 2021-02-11 ENCOUNTER — Ambulatory Visit (HOSPITAL_COMMUNITY): Payer: Medicare Other | Admitting: Nurse Practitioner

## 2021-02-12 ENCOUNTER — Other Ambulatory Visit: Payer: Self-pay

## 2021-02-17 ENCOUNTER — Other Ambulatory Visit: Payer: Self-pay

## 2021-02-17 NOTE — Telephone Encounter (Signed)
Last rx written 01/02/21. Last OV 01/01/21. Next OV has not been scheduled. UDS 11/26/15.

## 2021-02-17 NOTE — Telephone Encounter (Signed)
Pt is  requesting her HYDROcodone-acetaminophen (NORCO/VICODIN) 5-325 MG tabletsent to  [facility], [place] AT [address] Phone:  [phone]  Fax:  [phone]

## 2021-02-17 NOTE — Telephone Encounter (Signed)
Patient requested 3 month supply of pepcid 20 mg bid (recently filled for 1 month).  Because she is already on PPI, long term use of H2B is not anticipated to be necessary.  A f/u visit with me within the next 1-2 months would be helpful to discuss her symptoms.

## 2021-02-18 ENCOUNTER — Other Ambulatory Visit: Payer: Self-pay

## 2021-02-18 ENCOUNTER — Ambulatory Visit (HOSPITAL_COMMUNITY)
Admission: RE | Admit: 2021-02-18 | Discharge: 2021-02-18 | Disposition: A | Discharge status: Home / Self Care | Payer: Medicare Other | Source: Ambulatory Visit | Attending: Nurse Practitioner | Admitting: Nurse Practitioner

## 2021-02-18 VITALS — BP 174/86 | HR 74 | Ht 67.0 in | Wt 147.2 lb

## 2021-02-18 DIAGNOSIS — Z7901 Long term (current) use of anticoagulants: Secondary | ICD-10-CM | POA: Insufficient documentation

## 2021-02-18 DIAGNOSIS — D6869 Other thrombophilia: Secondary | ICD-10-CM | POA: Diagnosis not present

## 2021-02-18 DIAGNOSIS — I480 Paroxysmal atrial fibrillation: Secondary | ICD-10-CM | POA: Insufficient documentation

## 2021-02-18 DIAGNOSIS — Z87891 Personal history of nicotine dependence: Secondary | ICD-10-CM | POA: Diagnosis not present

## 2021-02-18 DIAGNOSIS — Z8249 Family history of ischemic heart disease and other diseases of the circulatory system: Secondary | ICD-10-CM | POA: Insufficient documentation

## 2021-02-18 DIAGNOSIS — Z79899 Other long term (current) drug therapy: Secondary | ICD-10-CM | POA: Diagnosis not present

## 2021-02-18 DIAGNOSIS — J45909 Unspecified asthma, uncomplicated: Secondary | ICD-10-CM | POA: Diagnosis not present

## 2021-02-18 IMAGING — CR DG CHEST 2V
2 series · 2 of 2 positions shown · non-contrast
Comparison: 10/25/2019

CLINICAL DATA: C/o palpitations since [DATE]. States she took her
medication and it felt like symptoms got worse. Denies chest pain.

EXAM:
CHEST - 2 VIEW

[chest pa]
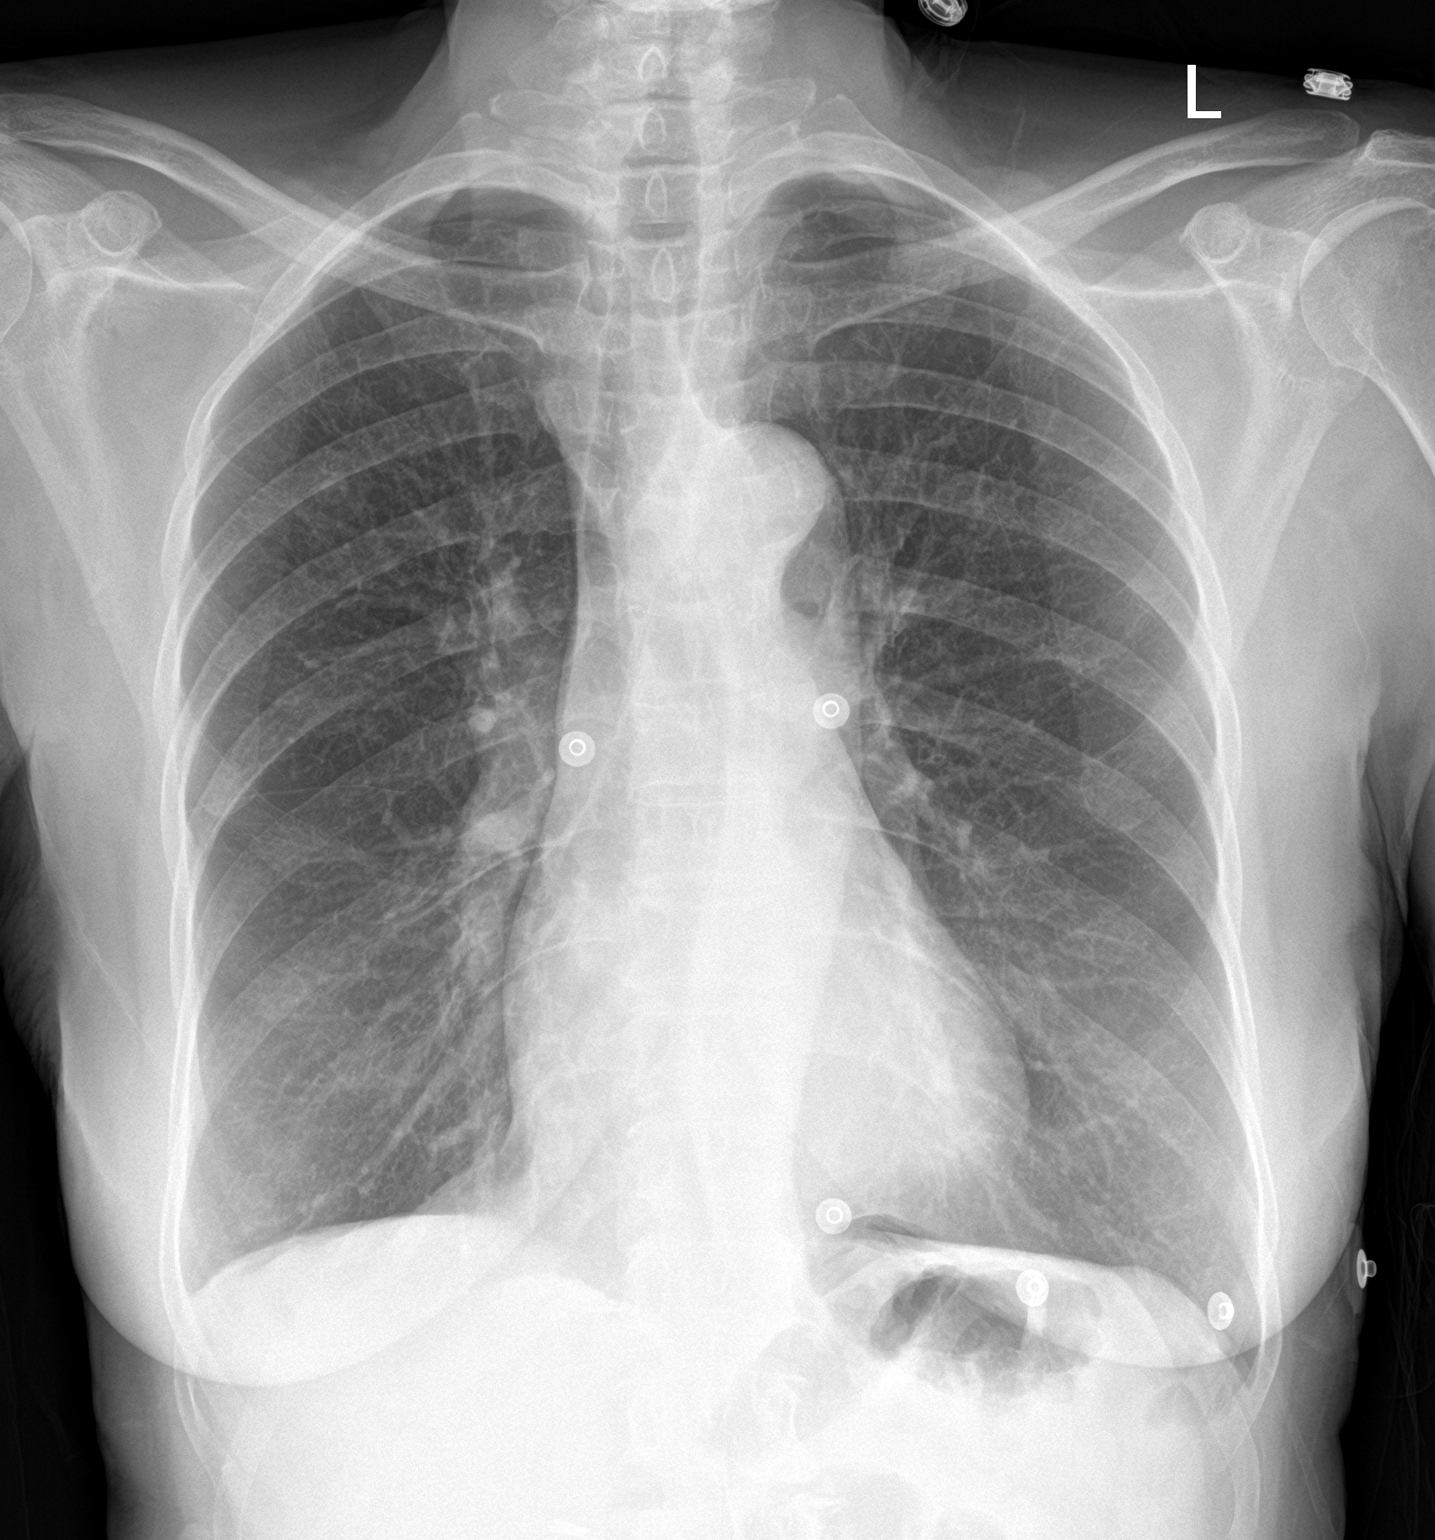

[chest lat]
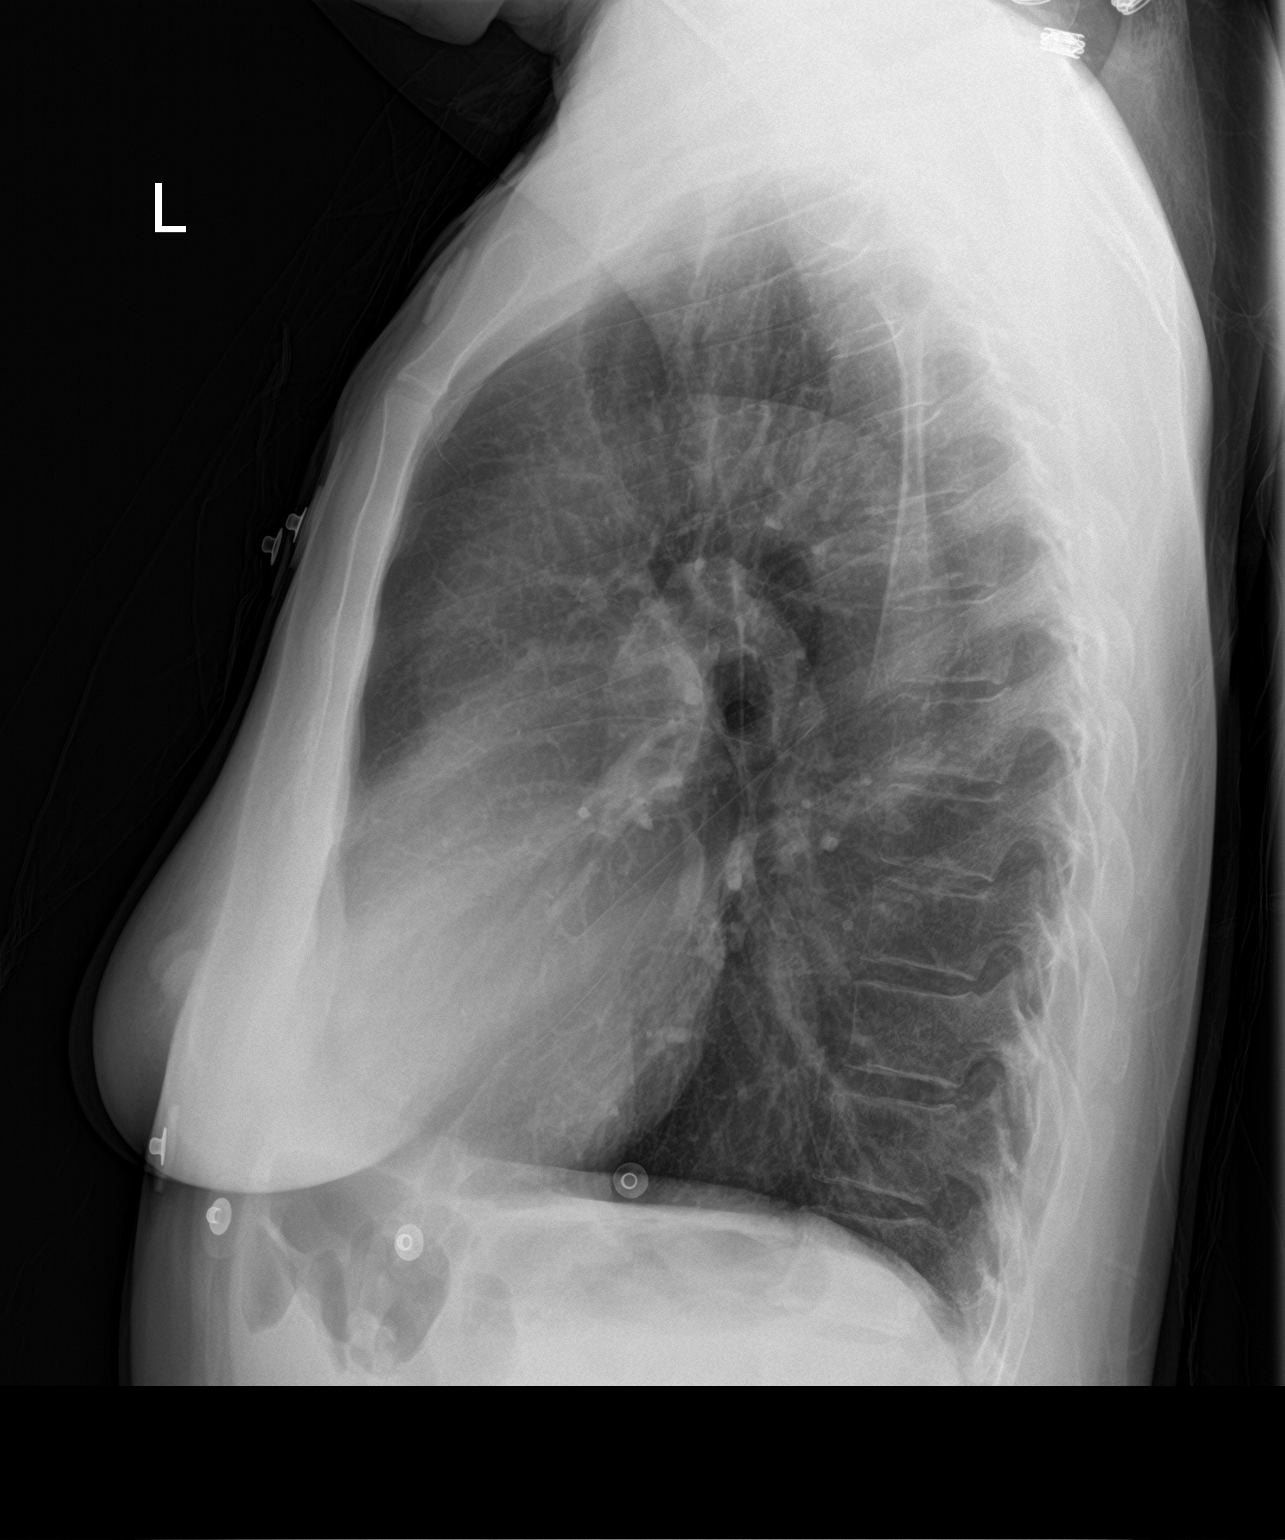

[2 of 2 positions shown; findings below may reference images not displayed]

FINDINGS: Cardiac silhouette is normal in size. No mediastinal or hilar masses
or evidence of adenopathy.

Lungs are hyperexpanded, but clear.

No pleural effusion or pneumothorax.

Skeletal structures are unremarkable.
IMPRESSION: 1. No acute cardiopulmonary disease.
2. Lung hyperexpansion suggesting COPD.

## 2021-02-18 NOTE — Progress Notes (Signed)
Primary Care Physician: Angelica Pou, MD Referring Physician:ER f/u EP: [name] is a 70 y.o. female with a h/o paroxysmal afib, s/p ablation, 11/2015, asthma seasonal allergies, that is in the afib clinic for f/u.  Currently she is not having any problems, no afib awareness and is doing well.   Today, she denies symptoms of palpitations, chest pain, shortness of breath, orthopnea, PND, lower extremity edema, dizziness, presyncope, syncope, or neurologic sequela. The patient is tolerating medications without difficulties and is otherwise without complaint today.   Past Medical History:  Diagnosis Date   Asthma in adult, mild intermittent, uncomplicated    Chronic anticoagulation    Chronic constipation    Eczema    GERD (gastroesophageal reflux disease)    History of kidney stones    Hx of vaginal bleeding, resolved, ultrasound completed 10/11/2018   Hypertension    Personal history of atrial fibrillation, s/p ablation, now in NSR but remains on anticoaulation 11/26/2015   Followed in cardiology clinic    Past Surgical History:  Procedure Laterality Date   ATRIAL FIBRILLATION ABLATION N/A 09/30/2017   Procedure: ATRIAL FIBRILLATION ABLATION;  Surgeon: Thompson Grayer, MD;  Location: [facility];  Service: Cardiovascular;  Laterality: N/A;   CHOLECYSTECTOMY N/A 12/08/2018   Procedure: LAPAROSCOPIC CHOLECYSTECTOMY WITH INTRAOPERATIVE CHOLANGIOGRAM;  Surgeon: Jovita Kussmaul, MD;  Location: [facility];  Service: General;  Laterality: N/A;   ERCP N/A 12/09/2018   Procedure: ENDOSCOPIC RETROGRADE CHOLANGIOPANCREATOGRAPHY (ERCP);  Surgeon: Carol Ada, MD;  Location: [facility];  Service: Endoscopy;  Laterality: N/A;   SPHINCTEROTOMY  12/09/2018   Procedure: SPHINCTEROTOMY;  Surgeon: Carol Ada, MD;  Location: [facility];  Service: Endoscopy;;   TONSILLECTOMY     TUBAL LIGATION      Current Outpatient Medications  Medication Sig Dispense Refill    alendronate (FOSAMAX) 70 MG tablet Take 1 tablet (70 mg total) by mouth every 7 (seven) days. Take with a full glass of water on an empty stomach. 12 tablet 3   alum & mag hydroxide-simeth (MAALOX MAX) 400-400-40 MG/5ML suspension Take 10 mLs by mouth every 6 (six) hours as needed for indigestion. 355 mL 0   atorvastatin (LIPITOR) 20 MG tablet Take 20 mg by mouth daily.     Azelastine-Fluticasone 137-50 MCG/ACT SUSP One spray in each nostril twice a day, every day (Patient taking differently: Place 1 spray into both nostrils as needed.) 23 g 5   cetirizine (ZYRTEC ALLERGY) 10 MG tablet Take 1 tablet (10 mg total) by mouth daily. (Patient taking differently: Take 10 mg by mouth daily as needed for allergies.) 30 tablet 2   Cholecalciferol (VITAMIN D) 50 MCG (2000 UT) tablet Take 1 tablet (2,000 Units total) by mouth daily. 90 tablet 3   cyclobenzaprine (FLEXERIL) 5 MG tablet Take 5 mg by mouth 3 (three) times daily as needed.     diltiazem (CARDIZEM) 60 MG tablet TAKE 1 TABLET BY MOUTH EVERY 6 HOURS AS NEEDED 45 tablet 1   diltiazem (CARTIA XT) 120 MG 24 hr capsule Take 1 capsule (120 mg total) by mouth 2 (two) times daily. 180 capsule 1   ezetimibe (ZETIA) 10 MG tablet Take 1 tablet (10 mg total) by mouth daily. 30 tablet 11   famotidine (PEPCID) 20 MG tablet Take 1 tablet (20 mg total) by mouth 2 (two) times daily. 60 tablet 0   fluticasone (FLOVENT HFA) 44 MCG/ACT inhaler Inhale 1 puff into the lungs daily. 30.8 g  1   HYDROcodone-acetaminophen (NORCO/VICODIN) 5-325 MG tablet Take 1/2 tab to 1 tab if needed for days when you are experiencing severe pain. (Patient taking differently: Take 1 tablet by mouth daily in the afternoon.) 30 tablet 0   levalbuterol (XOPENEX HFA) 45 MCG/ACT inhaler INHALE 1 PUFF INTO THE LUNGS EVERY 6 HOURS AS NEEDED FOR SHORTNESS OF BREATH (Patient taking differently: Inhale 1 puff into the lungs every 6 (six) hours as needed for shortness of breath.) 45 g 1   linaclotide  (LINZESS) 72 MCG capsule TAKE 1 CAPSULE(145 MCG) BY MOUTH DAILY BEFORE BREAKFAST 90 capsule 5   losartan (COZAAR) 50 MG tablet Take 2 tablets (100 mg total) by mouth daily. 135 tablet 3   montelukast (SINGULAIR) 10 MG tablet Take 1 tablet (10 mg total) by mouth at bedtime. 90 tablet 3   pantoprazole (PROTONIX) 40 MG tablet Take 1 tablet (40 mg total) by mouth 2 (two) times daily before a meal. Take 1 tablet (40 mg total) by mouth 2 (two) times daily before breakfast and dinner 30 tablet 5   rivaroxaban (XARELTO) 20 MG TABS tablet Take 1 tablet (20 mg total) by mouth daily with supper. 30 tablet 11   tiotropium (SPIRIVA) 18 MCG inhalation capsule Place 1 capsule (18 mcg total) into inhaler and inhale daily. 90 capsule 1   No current facility-administered medications for this encounter.    Allergies  Allergen Reactions   Albuterol Palpitations    Caused afib    Percocet [Oxycodone-Acetaminophen] Shortness Of Breath, Itching and Anxiety    Makes jittery, difficulty breathing, itching   Celecoxib Rash   Protonix [Pantoprazole Sodium] Palpitations   Tramadol Palpitations    Social History   Socioeconomic History   Marital status: Single    Spouse name: Not on file   Number of children: Not on file   Years of education: Not on file   Highest education level: Not on file  Occupational History   Occupation: Retired  Tobacco Use   Smoking status: Former    Pack years: 0.00    Types: Cigarettes    Quit date: 10/24/2015    Years since quitting: 5.3   Smokeless tobacco: Never  Vaping Use   Vaping Use: Never used  Substance and Sexual Activity   Alcohol use: Not Currently   Drug use: Not Currently    Types: Marijuana    Comment: Smoked marijuana in the past.   Sexual activity: Yes    Partners: Male    Birth control/protection: Post-menopausal  Other Topics Concern   Not on file  Social History Narrative   Lives alone in [place], [place] and has 2 daughters who are within driving  distance   Enjoys watching soap operas, reading, exercise   Social Determinants of Health   Financial Resource Strain: Not on file  Food Insecurity: Not on file  Transportation Needs: Not on file  Physical Activity: Not on file  Stress: Not on file  Social Connections: Not on file  Intimate Partner Violence: Not on file    Family History  Problem Relation Age of Onset   Heart attack Mother        Young age   Breast cancer Sister        Late 75s; now s/p mastectomy    Lung cancer Brother        x 2   Cancer Sister    Cancer Sister    Cancer Sister    Colon cancer Neg Hx  Colon polyps Neg Hx    Esophageal cancer Neg Hx    Rectal cancer Neg Hx    Stomach cancer Neg Hx     ROS- All systems are reviewed and negative except as per the HPI above  Physical Exam: Vitals:   02/18/21 1035  BP: (!) 174/86  Pulse: 74  Weight: 66.8 kg  Height: 5\' 7"  (1.702 m)   Wt Readings from Last 3 Encounters:  02/18/21 66.8 kg  01/01/21 68.9 kg  10/30/20 66 kg    Labs: Lab Results  Component Value Date   NA 137 02/03/2021   K 3.3 (L) 02/03/2021   CL 105 02/03/2021   CO2 24 02/03/2021   GLUCOSE 118 (H) 02/03/2021   BUN 9 02/03/2021   CREATININE 0.85 02/03/2021   CALCIUM 10.0 02/03/2021   PHOS 3.0 04/15/2017   MG 1.9 10/13/2020   Lab Results  Component Value Date   INR 1.0 12/08/2018   Lab Results  Component Value Date   CHOL 296 (H) 07/24/2020   HDL 78 07/24/2020   LDLCALC 198 (H) 07/24/2020   TRIG 116 07/24/2020     GEN- The patient is well appearing, alert and oriented x 3 today.   Head- normocephalic, atraumatic Eyes-  Sclera clear, conjunctiva pink Ears- hearing intact Oropharynx- clear Neck- supple, no JVP Lymph- no cervical lymphadenopathy Lungs- Clear to ausculation bilaterally, normal work of breathing Heart- Regular rate and rhythm, no murmurs, rubs or gallops, PMI not laterally displaced GI- soft, NT, ND, + BS Extremities- no clubbing, cyanosis, or  edema MS- no significant deformity or atrophy Skin- no rash or lesion Psych- euthymic mood, full affect Neuro- strength and sensation are intact  EKG-normal sinus rhythm  at 74 bpm, pr int 120 ms, qrs int 84 ms, qtc 424 ms    Assessment and Plan: 1. Paroxysmal afib S/p ablation 2017 No afib issues currently Continue  diltiazem  120 mg bid  Continue xarelto 20 mg daily for a CHA2DS2VASc score of 3 Last BMET in [place] 02/03/21 with stable creatinine at 0.85   F/u here in one year  F/u with PCP as scheduled  Butch Penny C. Allye Hoyos, [facility] [address] [place], [place] [address] [phone]

## 2021-02-19 MED ORDER — HYDROCODONE-ACETAMINOPHEN 5-325 MG PO TABS: ORAL_TABLET | ORAL | 0 refills | Status: DC

## 2021-02-24 ENCOUNTER — Encounter: Payer: Self-pay | Admitting: *Deleted

## 2021-03-02 ENCOUNTER — Telehealth: Payer: Self-pay | Admitting: Internal Medicine

## 2021-03-02 MED ORDER — ONDANSETRON HCL 4 MG PO TABS: 4.0000 mg | ORAL_TABLET | Freq: Three times a day (TID) | ORAL | 0 refills | Status: DC | PRN

## 2021-03-02 NOTE — Telephone Encounter (Signed)
Pt requesting a call back about a medication for Nausea.  Please call back.

## 2021-03-02 NOTE — Telephone Encounter (Signed)
RTC, patient states she went to the ED on 02/03/21 for Nausea from GERD.  She states the Zofran ODT which was prescribed was $80 and she can't afford this.  Asking if PCP can send a RX for another med for her to take for PRN nausea.  Will forward to PCP. SChaplin, RN,BSN

## 2021-03-17 ENCOUNTER — Telehealth: Payer: Self-pay | Admitting: Internal Medicine

## 2021-03-17 IMAGING — CT CT ABD-PELV W/O CM
2 of 4 series · 16 of 46 positions shown, 18 images · non-contrast
Comparison: 10/14/2018 CT renal stone. 11/27/2018 abdominal
ultrasound.

CLINICAL DATA: Abdominal distension. Lower back pain and rectal
bleeding since this morning. Patient on Xarelto.

EXAM:
CT ABDOMEN AND PELVIS WITHOUT CONTRAST
TECHNIQUE: Multidetector CT imaging of the abdomen and pelvis was performed
following the standard protocol without IV contrast.

[Series 3: a/p w/o 5mm · axial · non-contrast · 0.73mm/px · z∈[-740,-370]mm · 13 of 82 slices shown, 15 images]
[im 4/82  soft-tissue]
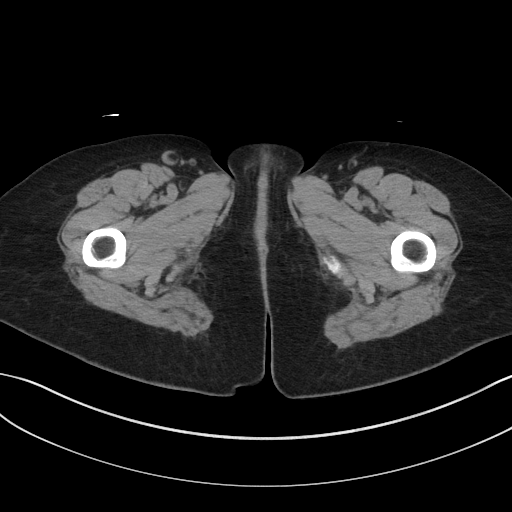
[im 4/82  bone]
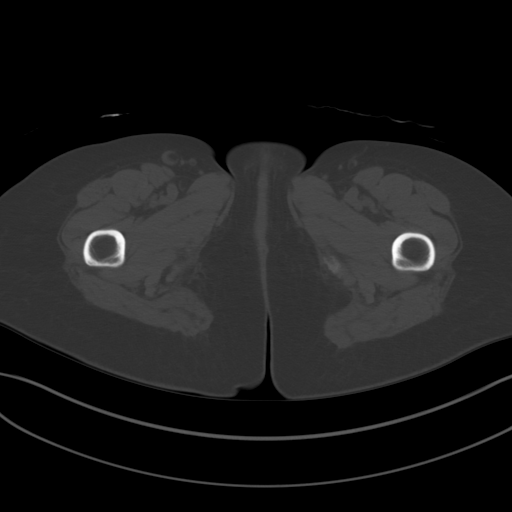
[im 10/82  soft-tissue]
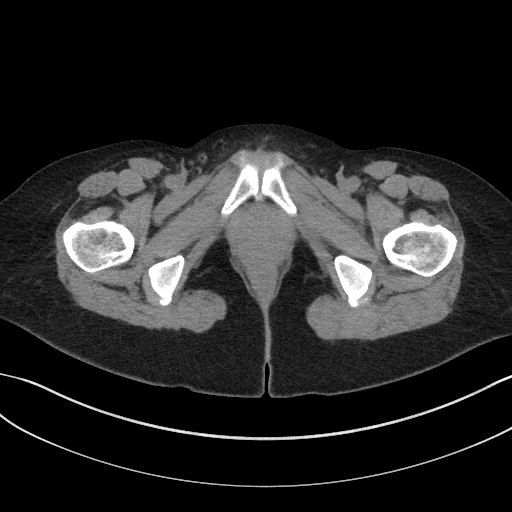
[im 17/82  soft-tissue]
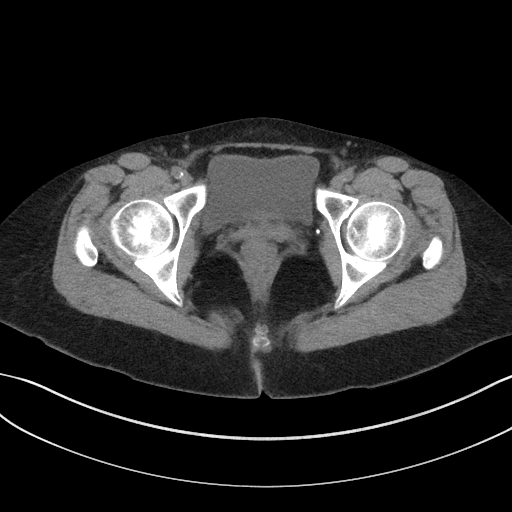
[im 23/82  soft-tissue]
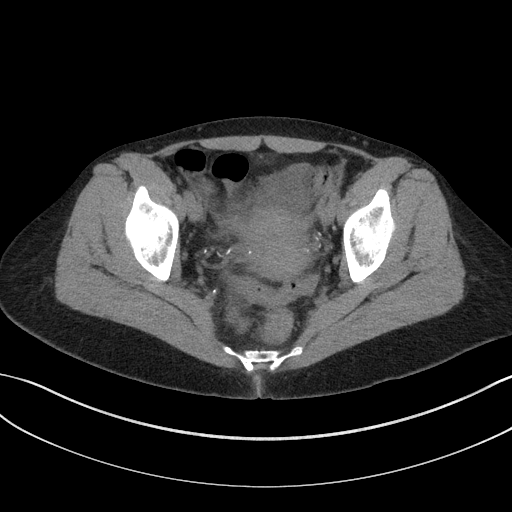
[im 30/82  soft-tissue]
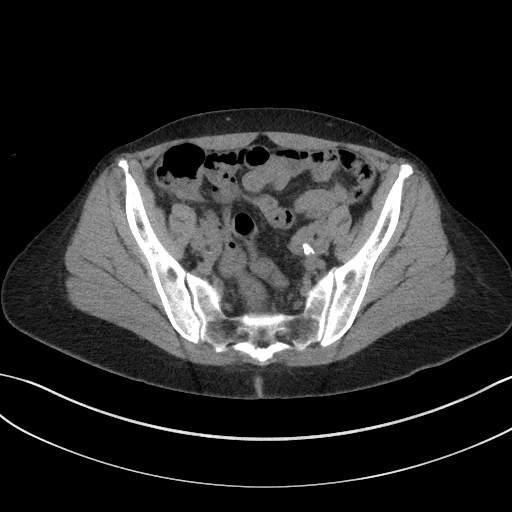
[im 36/82  soft-tissue]
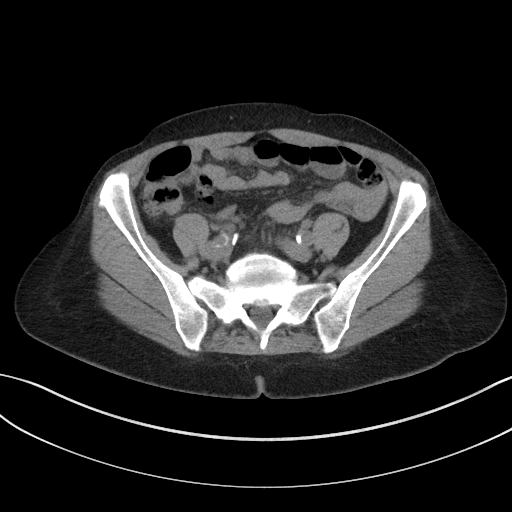
[im 43/82  soft-tissue]
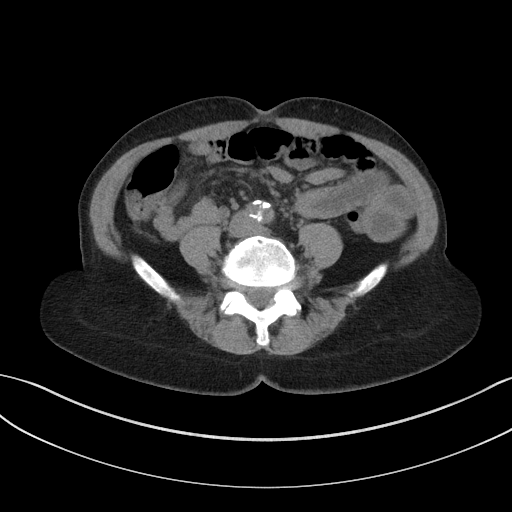
[im 46/82  soft-tissue]
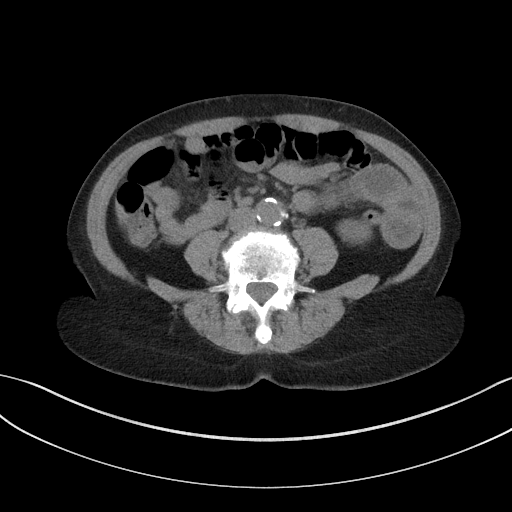
[im 52/82  soft-tissue]
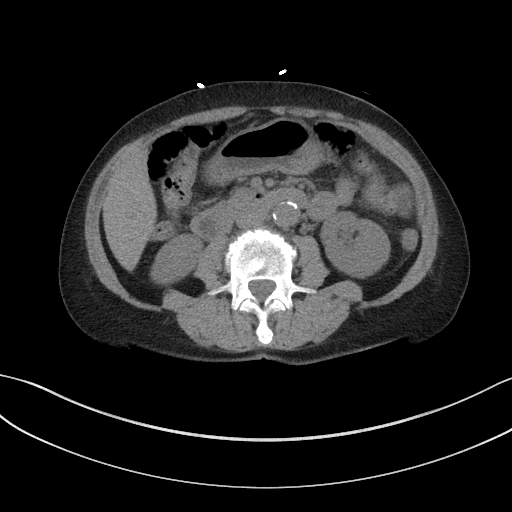
[im 52/82  bone]
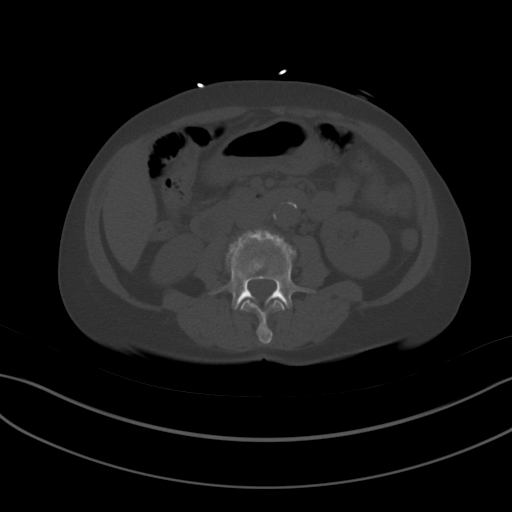
[im 59/82  soft-tissue]
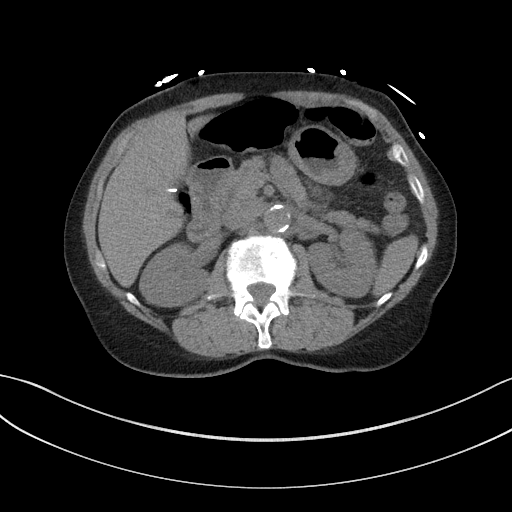
[im 65/82  soft-tissue]
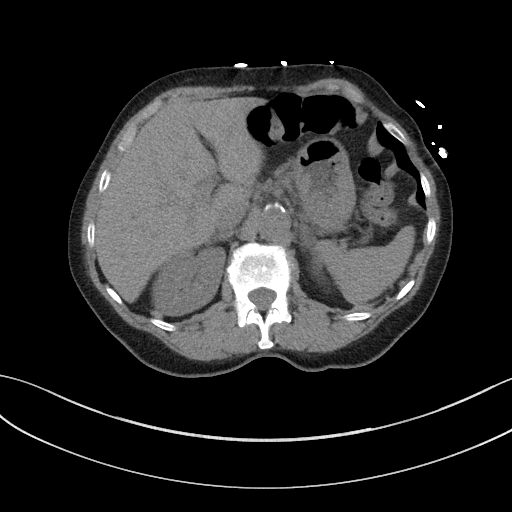
[im 72/82  soft-tissue]
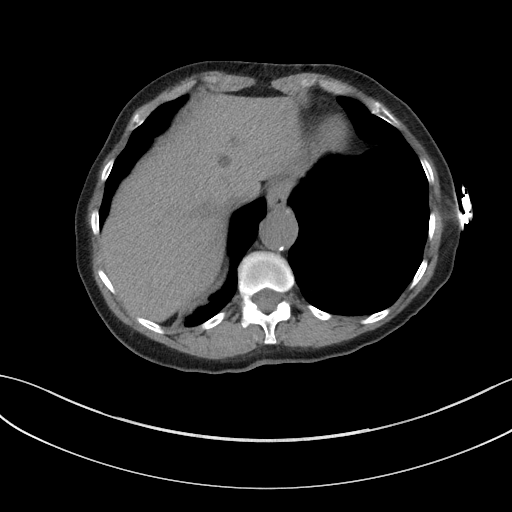
[im 78/82  soft-tissue]
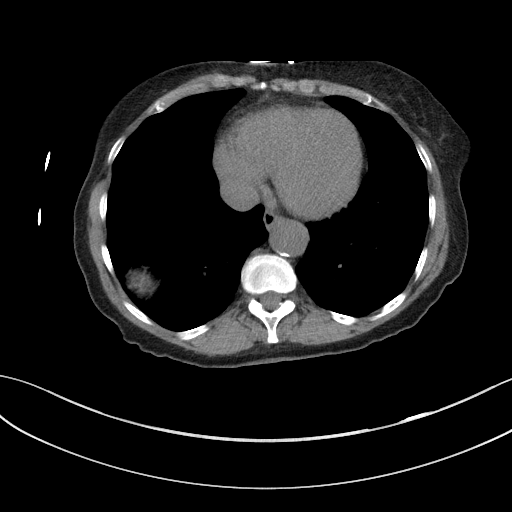

[Series 6: a/p w/o cor · coronal · non-contrast · 0.73mm/px · 3 of 150 slices shown]
[im 50/150  soft-tissue]
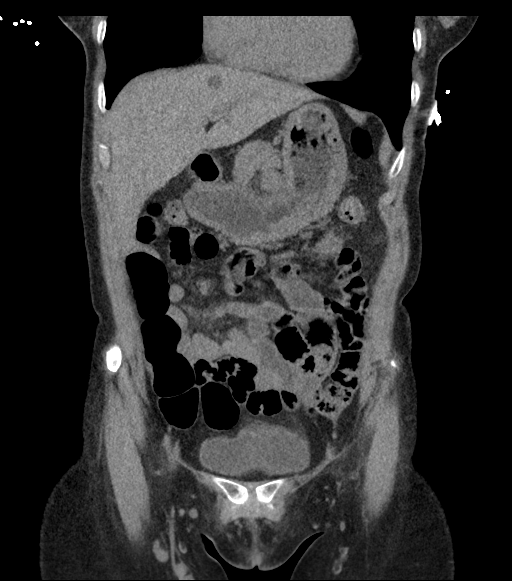
[im 67/150  soft-tissue]
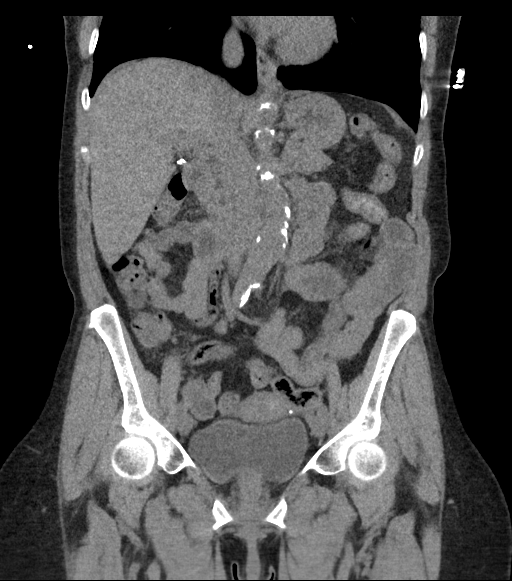
[im 83/150  soft-tissue]
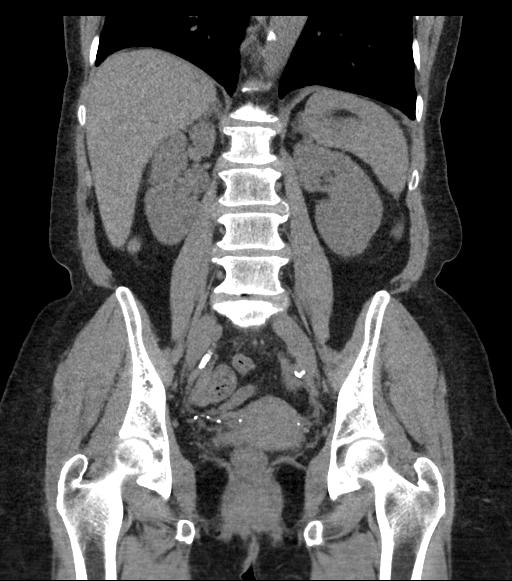

[16 of 46 positions shown; findings below may reference images not displayed]

FINDINGS: Please note that lack of intravenous contrast limits evaluation.

Lower chest: Minimal bibasilar subsegmental atelectasis.

Hepatobiliary: Unchanged subcentimeter hepatic cysts. Sequela of
interval cholecystectomy. No biliary dilatation.

Pancreas: Unremarkable. No pancreatic ductal dilatation or
surrounding inflammatory changes.

Spleen: Normal in size without focal abnormality.

Adrenals/Urinary Tract: Adrenal glands are unremarkable. No focal
renal lesion, or hydronephrosis. Nonobstructive punctate right
inferior pole calculus ([DATE]) is unchanged. Bladder is unremarkable.

Stomach/Bowel: Stomach is within normal limits. Appendix appears
normal. No evidence of bowel wall thickening, distention, or
inflammatory changes.

Vascular/Lymphatic: Calcified atheromatous plaque involving the
abdominal aorta and its branch vessels. Prominent to mildly enlarged
1.0 cm right inguinal node ([DATE]).

Reproductive: Uterus and bilateral adnexa are unremarkable.

Other: No abdominal wall hernia or abnormality. No abdominopelvic
ascites.

Musculoskeletal: No acute or suspicious osseous abnormalities.
Multilevel spondylosis with osteophytosis, Schmorl's node formation
and vacuum disc phenomena at the L4-5. Multilevel lumbar posterior
disc bulges with mild to moderate narrowing of the bilateral neural
foramina at the L2-5 levels.
IMPRESSION: No acute intra-abdominal process. Please note that lack of
intravenous contrast limits evaluation.

Prominent right inguinal node measuring 1.0 cm is likely reactive.

Additional chronic/incidental findings as detailed above.

Multilevel lumbar spondylosis with mild to moderate bilateral L2-5
neural foraminal narrowing, grossly unchanged.

## 2021-03-17 NOTE — Telephone Encounter (Signed)
Pt reporting since taking the following medication she has been having sharp stomach pains in her side and is requesting a call back   atorvastatin (LIPITOR) 20 MG tablet

## 2021-03-17 NOTE — Telephone Encounter (Signed)
RTC,  Patient states she spoke with a nurse on the nurseline through her insurance company and was told that the Atorvastatin and the Pantoprozole could cause "a lot of side effects".  Pt states she has pain in her legs and believes it is from taking the above medications.  RN asked patient if she would be open to having a telephone appt with [facility] clinical pharmacist, Dr. Georgina Peer, to discuss medications and she states yes.  Will forward to PCP to see if she thinks this might be of benefit. Thank you, SChaplin, RN,BSN

## 2021-03-29 IMAGING — MG DIGITAL SCREENING BILAT W/ TOMO W/ CAD
6 of 10 series · 6 of 30 positions shown · non-contrast
Comparison: Previous exam(s).

CLINICAL DATA: Screening.

EXAM:
DIGITAL SCREENING BILATERAL MAMMOGRAM WITH TOMO AND CAD

[R CC synth-2D (1 of 2)]
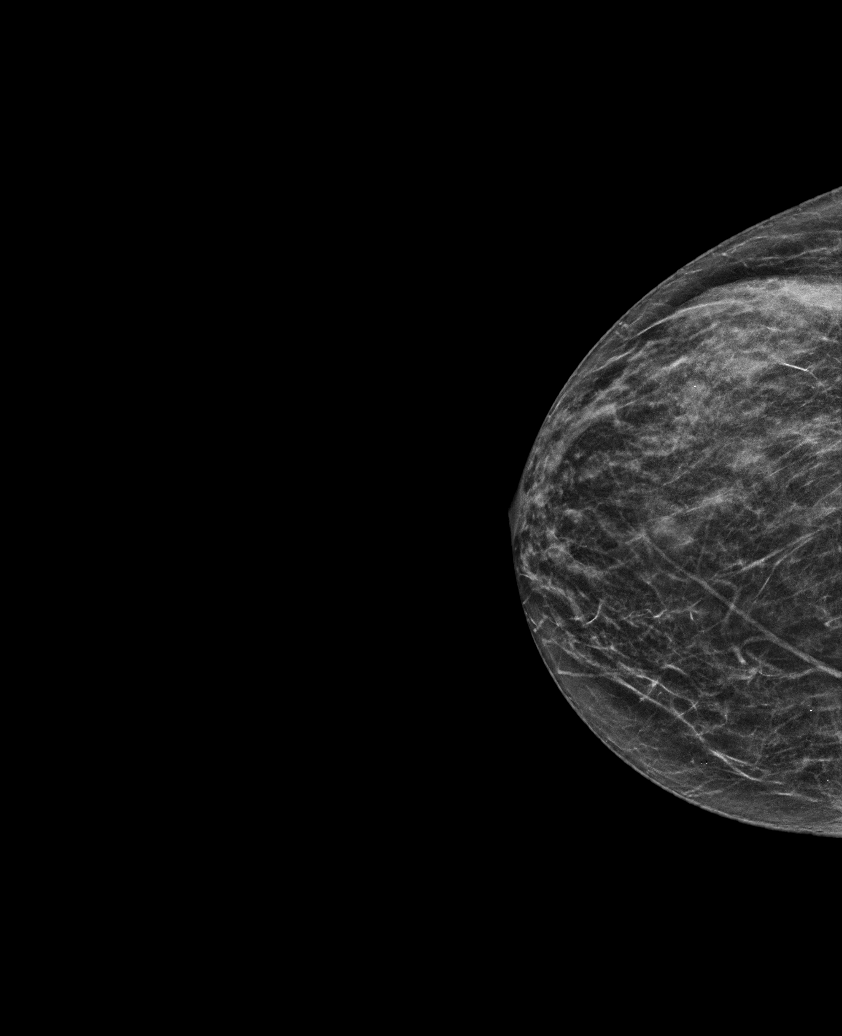

[L MLO synth-2D]
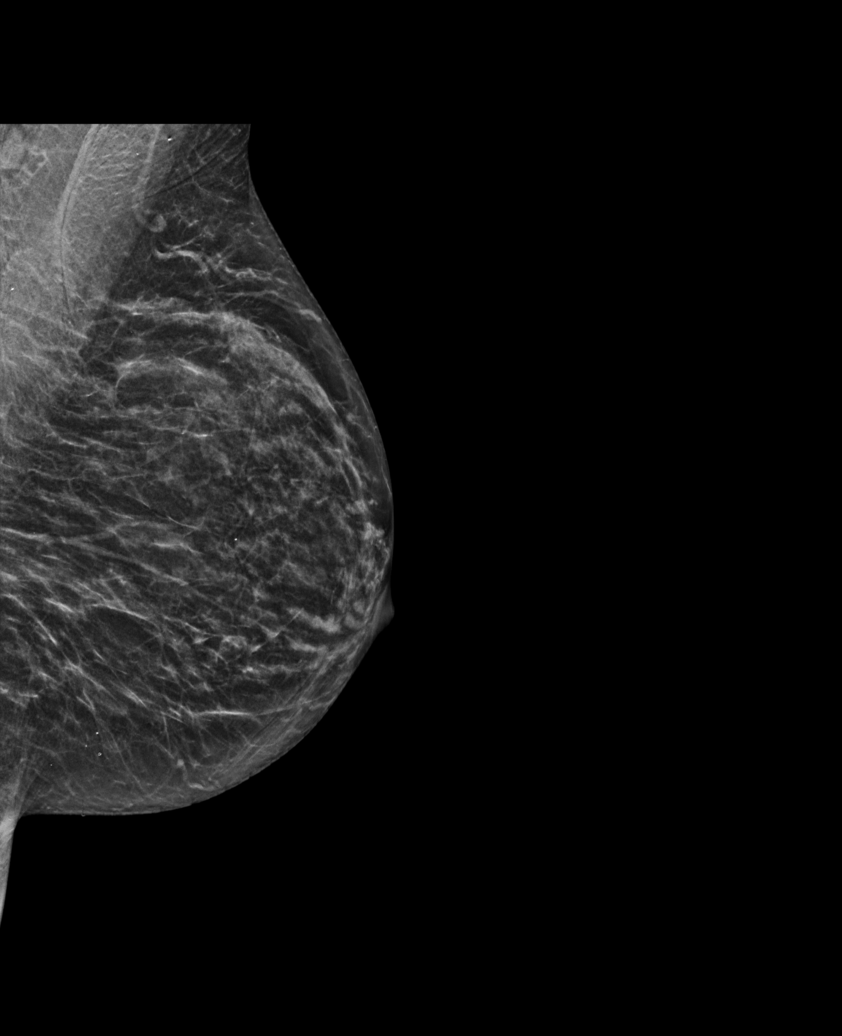

[L CC synth-2D]
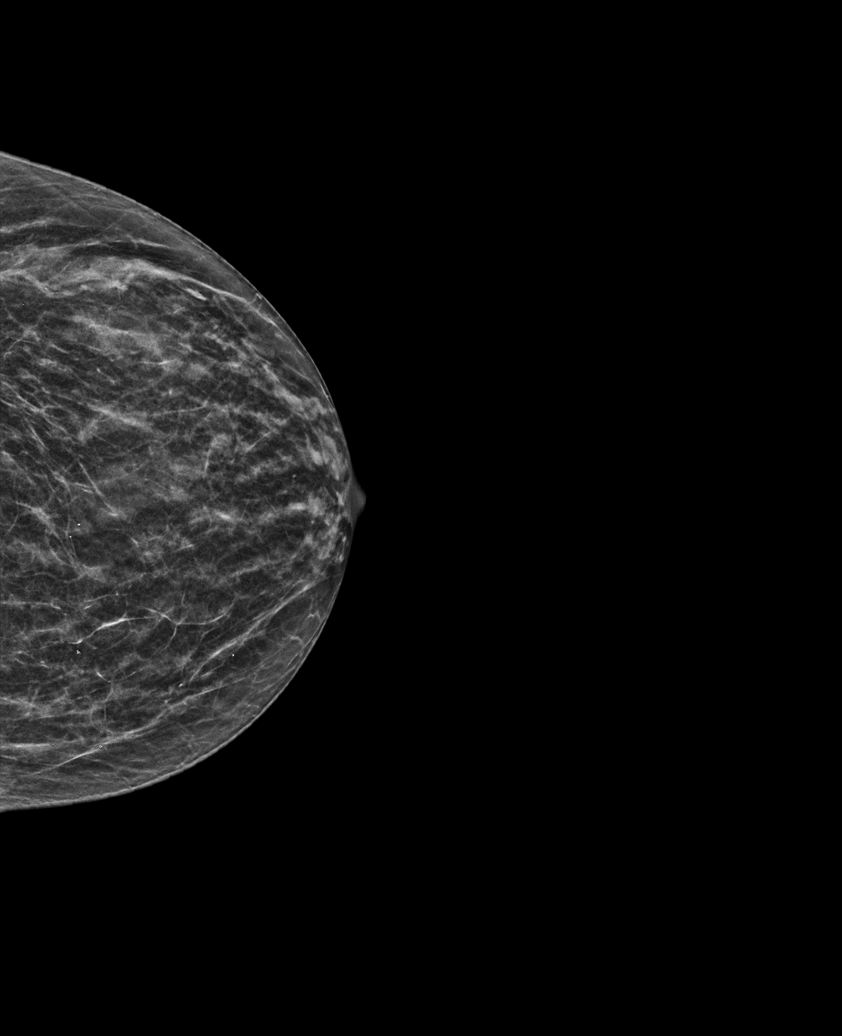

[R CC synth-2D (2 of 2)]
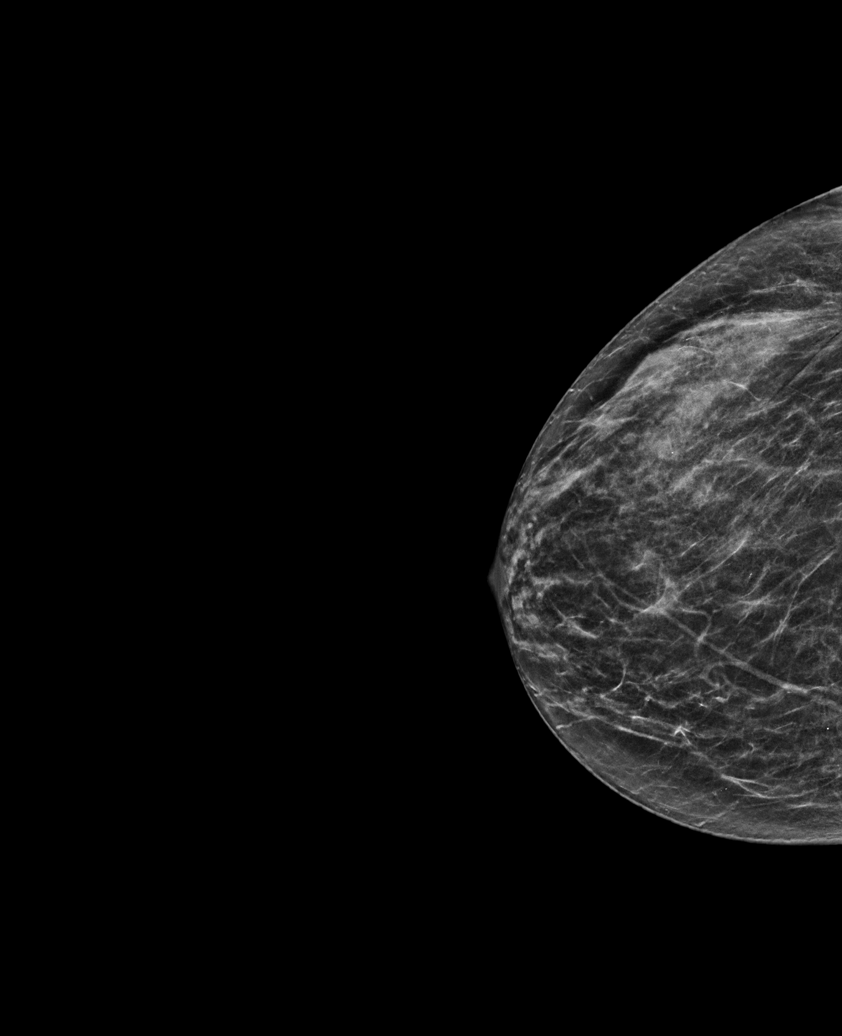

[R MLO synth-2D]
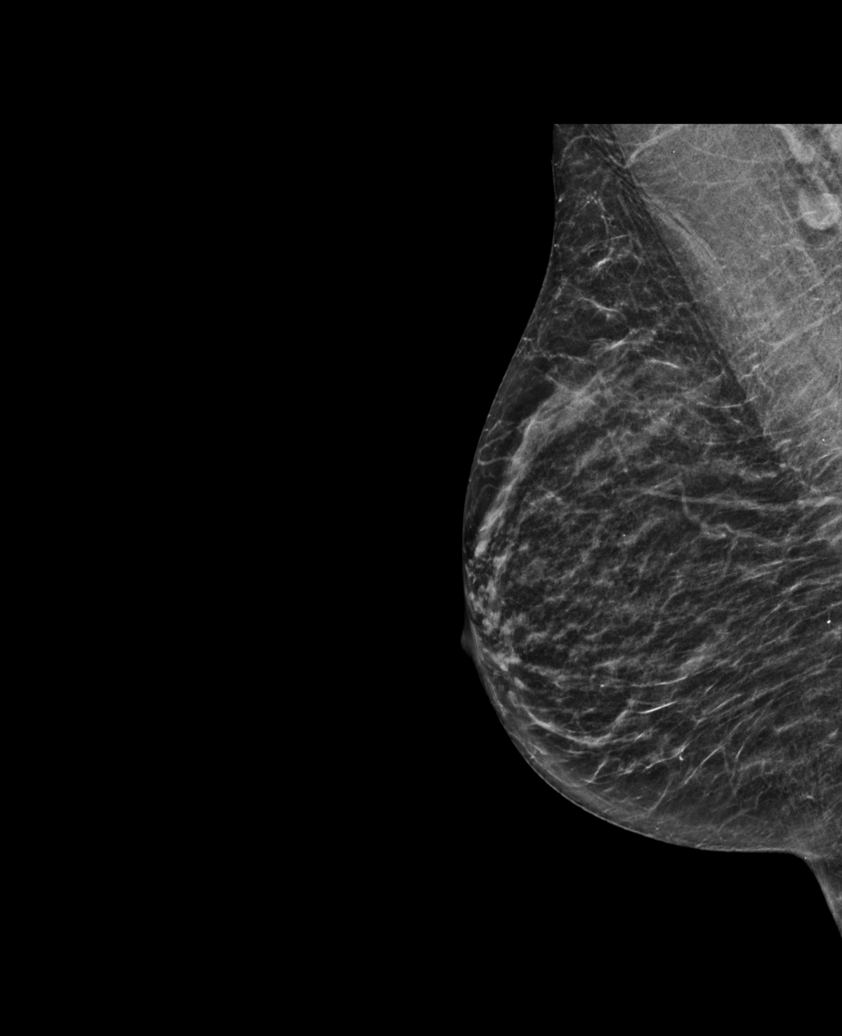

[R MLO tomo · tomo slice 25/48.0]
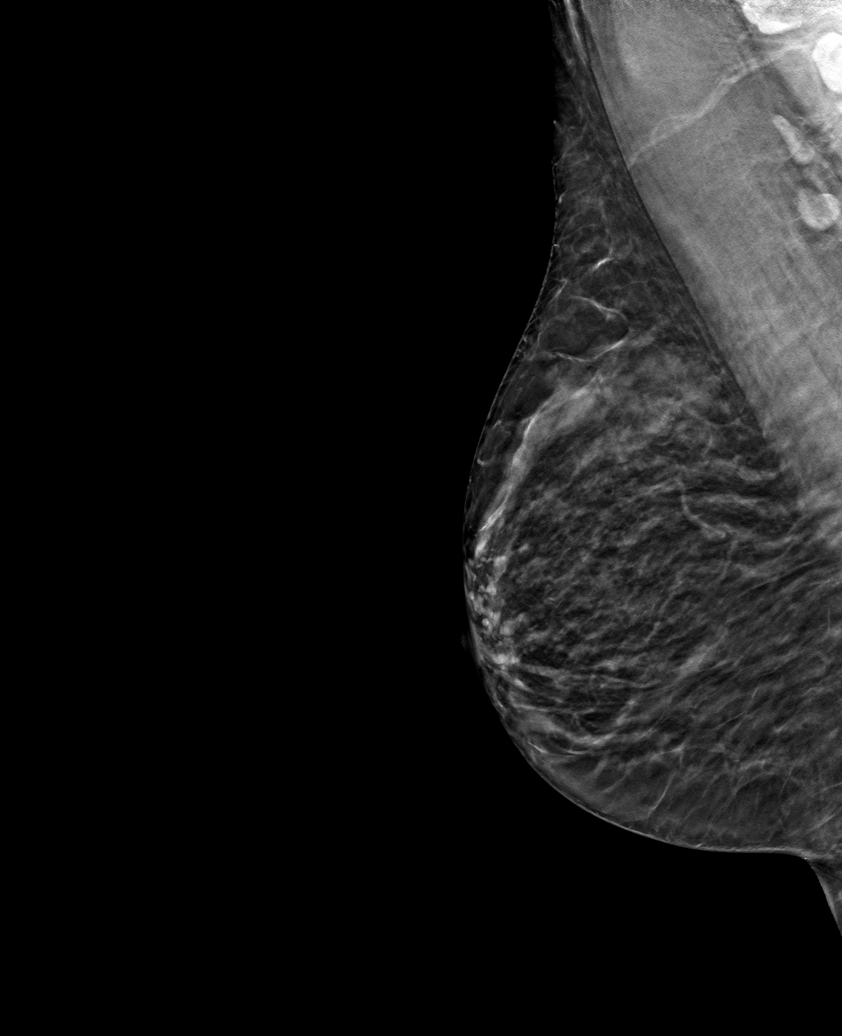

[6 of 30 positions shown; findings below may reference images not displayed]

ACR Breast Density Category c: The breast tissue is heterogeneously
dense, which may obscure small masses.
FINDINGS: There are no findings suspicious for malignancy. Images were
processed with CAD.
IMPRESSION: No mammographic evidence of malignancy. A result letter of this
screening mammogram will be mailed directly to the patient.

RECOMMENDATION:
Screening mammogram in one year. (Code:FT-U-LHB)

BI-RADS CATEGORY  1: Negative.

## 2021-04-05 ENCOUNTER — Emergency Department (HOSPITAL_COMMUNITY)
Admission: EM | Admit: 2021-04-05 | Discharge: 2021-04-05 | Disposition: A | Discharge status: Home / Self Care | Payer: Medicare Other | Attending: Emergency Medicine | Admitting: Emergency Medicine

## 2021-04-05 ENCOUNTER — Encounter (HOSPITAL_COMMUNITY): Payer: Self-pay | Admitting: Emergency Medicine

## 2021-04-05 ENCOUNTER — Emergency Department (HOSPITAL_COMMUNITY): Payer: Medicare Other

## 2021-04-05 ENCOUNTER — Other Ambulatory Visit: Payer: Self-pay

## 2021-04-05 DIAGNOSIS — R5383 Other fatigue: Secondary | ICD-10-CM | POA: Diagnosis not present

## 2021-04-05 DIAGNOSIS — I10 Essential (primary) hypertension: Secondary | ICD-10-CM | POA: Insufficient documentation

## 2021-04-05 DIAGNOSIS — J4520 Mild intermittent asthma, uncomplicated: Secondary | ICD-10-CM | POA: Insufficient documentation

## 2021-04-05 DIAGNOSIS — R0789 Other chest pain: Secondary | ICD-10-CM | POA: Diagnosis not present

## 2021-04-05 DIAGNOSIS — Z79899 Other long term (current) drug therapy: Secondary | ICD-10-CM | POA: Diagnosis not present

## 2021-04-05 DIAGNOSIS — Z7901 Long term (current) use of anticoagulants: Secondary | ICD-10-CM | POA: Diagnosis not present

## 2021-04-05 DIAGNOSIS — K219 Gastro-esophageal reflux disease without esophagitis: Secondary | ICD-10-CM | POA: Insufficient documentation

## 2021-04-05 DIAGNOSIS — R079 Chest pain, unspecified: Secondary | ICD-10-CM | POA: Diagnosis not present

## 2021-04-05 DIAGNOSIS — E876 Hypokalemia: Secondary | ICD-10-CM | POA: Diagnosis not present

## 2021-04-05 DIAGNOSIS — R072 Precordial pain: Secondary | ICD-10-CM | POA: Diagnosis present

## 2021-04-05 DIAGNOSIS — Z7951 Long term (current) use of inhaled steroids: Secondary | ICD-10-CM | POA: Insufficient documentation

## 2021-04-05 DIAGNOSIS — Z87891 Personal history of nicotine dependence: Secondary | ICD-10-CM | POA: Diagnosis not present

## 2021-04-05 LAB — CBC WITH DIFFERENTIAL/PLATELET
Abs Immature Granulocytes: 0.01 10*3/uL (ref 0.00–0.07)
Basophils Absolute: 0 10*3/uL (ref 0.0–0.1)
Basophils Relative: 1 %
Eosinophils Absolute: 0 10*3/uL (ref 0.0–0.5)
Eosinophils Relative: 0 %
HCT: 44.6 % (ref 36.0–46.0)
Hemoglobin: 14.6 g/dL (ref 12.0–15.0)
Immature Granulocytes: 0 %
Lymphocytes Relative: 23 %
Lymphs Abs: 1 10*3/uL (ref 0.7–4.0)
MCH: 26.6 pg (ref 26.0–34.0)
MCHC: 32.7 g/dL (ref 30.0–36.0)
MCV: 81.4 fL (ref 80.0–100.0)
Monocytes Absolute: 0.2 10*3/uL (ref 0.1–1.0)
Monocytes Relative: 5 %
Neutro Abs: 3 10*3/uL (ref 1.7–7.7)
Neutrophils Relative %: 71 %
Platelets: 264 10*3/uL (ref 150–400)
RBC: 5.48 MIL/uL — ABNORMAL HIGH (ref 3.87–5.11)
RDW: 16 % — ABNORMAL HIGH (ref 11.5–15.5)
WBC: 4.2 10*3/uL (ref 4.0–10.5)
nRBC: 0 % (ref 0.0–0.2)

## 2021-04-05 LAB — COMPREHENSIVE METABOLIC PANEL
ALT: 18 U/L (ref 0–44)
AST: 25 U/L (ref 15–41)
Albumin: 4.5 g/dL (ref 3.5–5.0)
Alkaline Phosphatase: 77 U/L (ref 38–126)
Anion gap: 12 (ref 5–15)
BUN: 8 mg/dL (ref 8–23)
CO2: 25 mmol/L (ref 22–32)
Calcium: 10.1 mg/dL (ref 8.9–10.3)
Chloride: 103 mmol/L (ref 98–111)
Creatinine, Ser: 0.78 mg/dL (ref 0.44–1.00)
GFR, Estimated: >60 mL/min (ref >60)
Glucose, Bld: 113 mg/dL — ABNORMAL HIGH (ref 70–99)
Potassium: 3 mmol/L — ABNORMAL LOW (ref 3.5–5.1)
Sodium: 140 mmol/L (ref 135–145)
Total Bilirubin: 0.8 mg/dL (ref 0.3–1.2)
Total Protein: 8.3 g/dL — ABNORMAL HIGH (ref 6.5–8.1)

## 2021-04-05 LAB — TROPONIN I (HIGH SENSITIVITY)
Troponin I (High Sensitivity): 8 ng/L (ref <18)
Troponin I (High Sensitivity): 9 ng/L (ref <18)

## 2021-04-05 LAB — LIPASE, BLOOD: Lipase: 24 U/L (ref 11–51)

## 2021-04-05 MED ORDER — SODIUM CHLORIDE 0.9 % IV BOLUS
1000.0000 mL | Freq: Once | INTRAVENOUS | Status: AC
  Administered 2021-04-05: 1000 mL via INTRAVENOUS

## 2021-04-05 MED ORDER — SUCRALFATE 1 G PO TABS: 1.0000 g | ORAL_TABLET | Freq: Three times a day (TID) | ORAL | 0 refills | Status: DC

## 2021-04-05 MED ORDER — FAMOTIDINE IN NACL 20-0.9 MG/50ML-% IV SOLN
20.0000 mg | Freq: Once | INTRAVENOUS | Status: AC
  Administered 2021-04-05: 20 mg via INTRAVENOUS
  Filled 2021-04-05: qty 50

## 2021-04-05 MED ORDER — ONDANSETRON HCL 4 MG/2ML IJ SOLN
4.0000 mg | Freq: Once | INTRAMUSCULAR | Status: AC
  Administered 2021-04-05: 4 mg via INTRAVENOUS
  Filled 2021-04-05: qty 2

## 2021-04-05 MED ORDER — SUCRALFATE 1 G PO TABS
1.0000 g | ORAL_TABLET | Freq: Once | ORAL | Status: AC
  Administered 2021-04-05: 1 g via ORAL
  Filled 2021-04-05: qty 1

## 2021-04-05 MED ORDER — POTASSIUM CHLORIDE CRYS ER 20 MEQ PO TBCR
20.0000 meq | EXTENDED_RELEASE_TABLET | Freq: Once | ORAL | Status: AC
  Administered 2021-04-05: 20 meq via ORAL
  Filled 2021-04-05: qty 1

## 2021-04-05 NOTE — Discharge Instructions (Signed)
You were seen in the emergency department for worsening of your reflux, nausea vomiting chest and abdominal pain.  You had lab work and an EKG that did not show any significant findings other than a low potassium.  Your symptoms improved with some medication.  Please continue your acid medication and we are prescribing you some sucralfate to help coat your esophagus and stomach.  Follow-up with your primary care doctor.

## 2021-04-05 NOTE — ED Triage Notes (Signed)
Pt reports acid reflux, vomiting, pain to center of chest, and pain to L abd x 2 days.

## 2021-04-05 NOTE — ED Provider Notes (Signed)
Emergency Medicine Provider Triage Evaluation Note  SANDRA KOSMALSKI , a 70 y.o. female  was evaluated in triage.  Pt complains of "acid reflux" for the past 2 days with pain from her epigastrium radiating into her chest, nausea, and vomiting. She does however report hx of cardiac stents as well. No SOB. Has been taking Famotidine without relief. No fevers or chills.  Review of Systems  Positive: + chest pain, epigastric pain, nausea, vomiting Negative: - SOB  Physical Exam  BP (!) 158/103 (BP Location: Left Arm)   Pulse 86   Temp 99.3 F (37.4 C) (Oral)   Resp 16   SpO2 98%  Gen:   Awake, no distress   Resp:  Normal effort  MSK:   Moves extremities without difficulty  Other:  + epigastric abdominal pain  Medical Decision Making  Medically screening exam initiated at 3:47 PM.  Appropriate orders placed.  [name] was informed that the remainder of the evaluation will be completed by another provider, this initial triage assessment does not replace that evaluation, and the importance of remaining in the ED until their evaluation is complete.     Eustaquio Maize, PA-C 04/05/21 [place], DO 04/05/21 2208

## 2021-04-05 NOTE — ED Provider Notes (Signed)
[name] EMERGENCY DEPARTMENT Provider Note   CSN: [id number] Arrival date & time: 04/05/21  1532     History No chief complaint on file.   [name] is a 70 y.o. female.  He is here with a complaint of worsening of her reflux for the last 4 days.  It is caused her to be nauseous and vomit.  Having burning pain in her epigastrium and her chest.  Unable to hold down her medications due to symptoms.  Feels generally weak.  No fevers or chills.  No urinary symptoms.  No blood in the vomitus.  No shortness of breath  The history is provided by the patient.  Chest Pain Pain location:  Substernal area and epigastric Pain quality: burning   Pain severity:  Moderate Onset quality:  Gradual Duration:  4 days Timing:  Intermittent Progression:  Unchanged Chronicity:  New Context: not trauma   Relieved by:  Nothing Exacerbated by: vomiting. Ineffective treatments:  Antacids Associated symptoms: abdominal pain, fatigue, heartburn, nausea and vomiting   Associated symptoms: no cough, no fever, no headache and no shortness of breath       Past Medical History:  Diagnosis Date   Asthma in adult, mild intermittent, uncomplicated    Chronic anticoagulation    Chronic constipation    Eczema    GERD (gastroesophageal reflux disease)    History of kidney stones    Hx of vaginal bleeding, resolved, ultrasound completed 10/11/2018   Hypertension    Personal history of atrial fibrillation, s/p ablation, now in NSR but remains on anticoaulation 11/26/2015   Followed in cardiology clinic     Patient Active Problem List   Diagnosis Date Noted   H/O hypokalemia 10/29/2020   Abdominal aortic atherosclerosis ([place]) 10/22/2020   Seasonal and perennial allergic (presumed) rhinitis 07/25/2020   Asthma in adult, mild intermittent, uncomplicated    Chronic recurrent bilateral lower abdominal pain 07/24/2020   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 ([place]) 07/23/2020    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 02/14/2020   Primary osteoarthritis involving multiple joints 06/14/2018   Osteoporosis without current pathological fracture 04/15/2017   Constipation, chronic 10/19/2016   Bilateral hearing loss due to cerumen impaction 09/10/2016   Hyperlipidemia 03/18/2016   GERD (gastroesophageal reflux disease)    Personal history of atrial fibrillation, now s/p ablation in NSR 11/26/2015   Marijuana abuse 06/11/2015   Hypertension     Past Surgical History:  Procedure Laterality Date   ATRIAL FIBRILLATION ABLATION N/A 09/30/2017   Procedure: ATRIAL FIBRILLATION ABLATION;  Surgeon: Shelby Grayer, MD;  Location: [facility];  Service: Cardiovascular;  Laterality: N/A;   CHOLECYSTECTOMY N/A 12/08/2018   Procedure: LAPAROSCOPIC CHOLECYSTECTOMY WITH INTRAOPERATIVE CHOLANGIOGRAM;  Surgeon: Shelby Kussmaul, MD;  Location: [facility];  Service: General;  Laterality: N/A;   ERCP N/A 12/09/2018   Procedure: ENDOSCOPIC RETROGRADE CHOLANGIOPANCREATOGRAPHY (ERCP);  Surgeon: Shelby Ada, MD;  Location: [facility];  Service: Endoscopy;  Laterality: N/A;   SPHINCTEROTOMY  12/09/2018   Procedure: SPHINCTEROTOMY;  Surgeon: Shelby Ada, MD;  Location: [facility];  Service: Endoscopy;;   TONSILLECTOMY     TUBAL LIGATION       OB History   No obstetric history on file.     Family History  Problem Relation Age of Onset   Heart attack Mother        Young age   Breast cancer Sister        Late 69s; now s/p mastectomy  Lung cancer Brother        x 2   Cancer Sister    Cancer Sister    Cancer Sister    Colon cancer Neg Hx    Colon polyps Neg Hx    Esophageal cancer Neg Hx    Rectal cancer Neg Hx    Stomach cancer Neg Hx     Social History   Tobacco Use   Smoking status: Former    Types: Cigarettes    Quit date: 10/24/2015    Years since quitting: 5.4   Smokeless tobacco: Never  Vaping Use   Vaping Use: Never used  Substance Use Topics    Alcohol use: Not Currently   Drug use: Not Currently    Types: Marijuana    Comment: Smoked marijuana in the past.    Home Medications Prior to Admission medications   Medication Sig Start Date End Date Taking? Authorizing Provider  alendronate (FOSAMAX) 70 MG tablet Take 1 tablet (70 mg total) by mouth every 7 (seven) days. Take with a full glass of water on an empty stomach. 10/30/20 10/30/21  Shelby Pou, MD  alum & mag hydroxide-simeth (MAALOX MAX) [id number] MG/5ML suspension Take 10 mLs by mouth every 6 (six) hours as needed for indigestion. 02/03/21   Shelby Larsen, PA-C  atorvastatin (LIPITOR) 20 MG tablet Take 20 mg by mouth daily. 12/21/20   [provider]  Azelastine-Fluticasone 137-50 MCG/ACT SUSP One spray in each nostril twice a day, every day Patient taking differently: Place 1 spray into both nostrils as needed. 07/24/20   Shelby Pou, MD  cetirizine (ZYRTEC ALLERGY) 10 MG tablet Take 1 tablet (10 mg total) by mouth daily. Patient taking differently: Take 10 mg by mouth daily as needed for allergies. 09/18/20 09/18/21  Shelby Pou, MD  Cholecalciferol (VITAMIN D) 50 MCG (2000 UT) tablet Take 1 tablet (2,000 Units total) by mouth daily. 10/30/20   Shelby Pou, MD  cyclobenzaprine (FLEXERIL) 5 MG tablet Take 5 mg by mouth 3 (three) times daily as needed. 10/13/20   [provider]  diltiazem (CARDIZEM) 60 MG tablet TAKE 1 TABLET BY MOUTH EVERY 6 HOURS AS NEEDED 11/05/20   Shelby Needs, NP  diltiazem (CARTIA XT) 120 MG 24 hr capsule Take 1 capsule (120 mg total) by mouth 2 (two) times daily. 01/26/21   Shelby Needs, NP  ezetimibe (ZETIA) 10 MG tablet Take 1 tablet (10 mg total) by mouth daily. 10/30/20 10/30/21  Shelby Pou, MD  famotidine (PEPCID) 20 MG tablet Take 1 tablet (20 mg total) by mouth 2 (two) times daily. 02/06/21 03/08/21  Shelby Pou, MD  fluticasone (FLOVENT HFA) 44 MCG/ACT inhaler Inhale 1 puff into  the lungs daily. 09/18/20   Shelby Pou, MD  HYDROcodone-acetaminophen (NORCO/VICODIN) 5-325 MG tablet Take 1/2 tab to 1 tab if needed for days when you are experiencing severe pain. 02/19/21   Shelby Pou, MD  levalbuterol [facility] HFA) 45 MCG/ACT inhaler INHALE 1 PUFF INTO THE LUNGS EVERY 6 HOURS AS NEEDED FOR SHORTNESS OF BREATH Patient taking differently: Inhale 1 puff into the lungs every 6 (six) hours as needed for shortness of breath. 09/14/20   Riesa Pope, MD  linaclotide (LINZESS) 72 MCG capsule TAKE 1 CAPSULE(145 MCG) BY MOUTH DAILY BEFORE BREAKFAST 07/25/20   Shelby Pou, MD  losartan (COZAAR) 50 MG tablet Take 2 tablets (100 mg total) by mouth daily. 09/18/20 02/18/21  Shelby Pou, MD  montelukast (SINGULAIR) 10 MG tablet Take 1 tablet (10 mg total) by mouth at bedtime. 09/19/20   Shelby Pou, MD  ondansetron (ZOFRAN) 4 MG tablet Take 1 tablet (4 mg total) by mouth every 8 (eight) hours as needed for vomiting or nausea. 03/02/21   Shelby Pou, MD  pantoprazole (PROTONIX) 40 MG tablet Take 1 tablet (40 mg total) by mouth 2 (two) times daily before a meal. Take 1 tablet (40 mg total) by mouth 2 (two) times daily before breakfast and dinner 02/03/21   Shelby Larsen, PA-C  rivaroxaban (XARELTO) 20 MG TABS tablet Take 1 tablet (20 mg total) by mouth daily with supper. 10/13/20   Shelby Needs, NP  tiotropium (SPIRIVA) 18 MCG inhalation capsule Place 1 capsule (18 mcg total) into inhaler and inhale daily. 11/24/20   Shelby Pou, MD    Allergies    Albuterol, Percocet [oxycodone-acetaminophen], Celecoxib, Protonix [pantoprazole sodium], and Tramadol  Review of Systems   Review of Systems  Constitutional:  Positive for fatigue. Negative for fever.  HENT:  Negative for sore throat.   Eyes:  Negative for visual disturbance.  Respiratory:  Negative for cough and shortness of breath.   Cardiovascular:  Positive for chest pain.   Gastrointestinal:  Positive for abdominal pain, heartburn, nausea and vomiting.  Genitourinary:  Negative for dysuria.  Musculoskeletal:  Negative for neck pain.  Skin:  Negative for rash.  Neurological:  Negative for headaches.   Physical Exam Updated Vital Signs BP (!) 141/90   Pulse 77   Temp 98.7 F (37.1 C) (Oral)   Resp 14   SpO2 100%   Physical Exam Vitals and nursing note reviewed.  Constitutional:      General: She is not in acute distress.    Appearance: Normal appearance. She is well-developed.  HENT:     Head: Normocephalic and atraumatic.  Eyes:     Conjunctiva/sclera: Conjunctivae normal.  Cardiovascular:     Rate and Rhythm: Normal rate and regular rhythm.     Heart sounds: No murmur heard. Pulmonary:     Effort: Pulmonary effort is normal. No respiratory distress.     Breath sounds: Normal breath sounds.  Abdominal:     Palpations: Abdomen is soft.     Tenderness: There is no abdominal tenderness. There is no guarding or rebound.  Musculoskeletal:        General: Normal range of motion.     Cervical back: Neck supple.     Right lower leg: No edema.     Left lower leg: No edema.  Skin:    General: Skin is warm and dry.  Neurological:     General: No focal deficit present.     Mental Status: She is alert.    ED Results / Procedures / Treatments   Labs (all labs ordered are listed, but only abnormal results are displayed) Labs Reviewed  COMPREHENSIVE METABOLIC PANEL - Abnormal; Notable for the following components:      Result Value   Potassium 3.0 (*)    Glucose, Bld 113 (*)    Total Protein 8.3 (*)    All other components within normal limits  CBC WITH DIFFERENTIAL/PLATELET - Abnormal; Notable for the following components:   RBC 5.48 (*)    RDW 16.0 (*)    All other components within normal limits  LIPASE, BLOOD  TROPONIN I (HIGH SENSITIVITY)  TROPONIN I (HIGH SENSITIVITY)    EKG EKG Interpretation  Date/Time:  Sunday April 05 2021  15:36:58 EDT Ventricular Rate:  86 PR Interval:  110 QRS Duration: 84 QT Interval:  382 QTC Calculation: 457 R Axis:   72 Text Interpretation: Sinus rhythm with sinus arrhythmia with short PR Otherwise normal ECG No significant change since last tracing Confirmed by Wynona Dove ([id number]) on 04/05/2021 5:47:34 PM  Radiology DG Chest 2 View  Result Date: 04/05/2021 CLINICAL DATA:  Chest pain. EXAM: CHEST - 2 VIEW COMPARISON:  February 03, 2021 FINDINGS: Hyperinflation of the lungs with flattening of the hemidiaphragms. The heart, hila, mediastinum, lungs, and pleura are otherwise normal. IMPRESSION: Hyperinflation of the lungs suggesting COPD or emphysema. No other abnormalities. Electronically Signed   By: Dorise Bullion III M.D.   On: 04/05/2021 17:21    Procedures Procedures   Medications Ordered in ED Medications  sodium chloride 0.9 % bolus 1,000 mL (has no administration in time range)  ondansetron (ZOFRAN) injection 4 mg (has no administration in time range)  famotidine (PEPCID) IVPB 20 mg premix (has no administration in time range)  sucralfate (CARAFATE) tablet 1 g (has no administration in time range)    ED Course  I have reviewed the triage vital signs and the nursing notes.  Pertinent labs & imaging results that were available during my care of the patient were reviewed by me and considered in my medical decision making (see chart for details).  Clinical Course as of 04/06/21 1047  Sun Apr 05, 2021  1944 She states her burning pain is better.  Initial Trope was 88 and second troponin is cooking.  She likely can be discharged soon. [MB]    Clinical Course User Index [MB] Hayden Rasmussen, MD   MDM Rules/Calculators/A&P                          This patient complains of epigastric and chest burning pain vomiting; this involves an extensive number of treatment Options and is a complaint that carries with it a high risk of complications and Morbidity. The differential  includes ACS, reflux, peptic ulcer disease, cholelithiasis, cholecystitis  I ordered, reviewed and interpreted labs, which included CBC with normal white count normal hemoglobin, chemistries with low potassium otherwise unremarkable, troponins flat, LFTs and lipase normal I ordered medication IV fluids and nausea medication, IV Pepcid, oral Carafate and potassium I ordered imaging studies which included chest x-ray and I independently    visualized and interpreted imaging which showed no acute disease Previous records obtained and reviewed in epic no recent admissions  After the interventions stated above, I reevaluated the patient and found patient to be symptomatically improved and tolerating p.o.  No evidence of cardiac injury.  Unfortunately she is allergic to PPIs.  We will add Carafate to her regiment.  Return instructions discussed   Final Clinical Impression(s) / ED Diagnoses Final diagnoses:  Nonspecific chest pain  Gastroesophageal reflux disease, unspecified whether esophagitis present  Hypokalemia    Rx / DC Orders ED Discharge Orders          Ordered    sucralfate (CARAFATE) 1 g tablet  3 times daily with meals & bedtime        04/05/21 1946             Hayden Rasmussen, MD 04/06/21 1050

## 2021-04-09 ENCOUNTER — Other Ambulatory Visit: Payer: Self-pay

## 2021-04-09 MED ORDER — HYDROCODONE-ACETAMINOPHEN 5-325 MG PO TABS: ORAL_TABLET | ORAL | 0 refills | Status: DC

## 2021-04-09 NOTE — Telephone Encounter (Signed)
Pt is requesting her HYDROcodone-acetaminophen (NORCO/VICODIN) 5-325 MG tablet  sent to  [facility] [place], [place] AT [facility] Phone:  [phone]  Fax:  [phone]

## 2021-04-10 NOTE — Telephone Encounter (Signed)
Pt calling to report that she rec'd a call from her  Pharmacy:  HYDROcodone-acetaminophen (NORCO/VICODIN) 5-325 MG tablet   [facility] [place], [place] AT [facility] Phone:  [phone]  Fax:  [phone]     Pt -next closet place is on   [place], [place] [address] - [facility]  Phone: [phone]

## 2021-04-11 ENCOUNTER — Other Ambulatory Visit: Payer: Self-pay

## 2021-04-11 ENCOUNTER — Emergency Department (HOSPITAL_COMMUNITY): Payer: Medicare Other

## 2021-04-11 ENCOUNTER — Emergency Department (HOSPITAL_COMMUNITY)
Admission: EM | Admit: 2021-04-11 | Discharge: 2021-04-11 | Disposition: A | Discharge status: Home / Self Care | Payer: Medicare Other | Attending: Emergency Medicine | Admitting: Emergency Medicine

## 2021-04-11 ENCOUNTER — Encounter (HOSPITAL_COMMUNITY): Payer: Self-pay | Admitting: Emergency Medicine

## 2021-04-11 DIAGNOSIS — Z79899 Other long term (current) drug therapy: Secondary | ICD-10-CM | POA: Diagnosis not present

## 2021-04-11 DIAGNOSIS — Z87891 Personal history of nicotine dependence: Secondary | ICD-10-CM | POA: Diagnosis not present

## 2021-04-11 DIAGNOSIS — M5441 Lumbago with sciatica, right side: Secondary | ICD-10-CM | POA: Diagnosis not present

## 2021-04-11 DIAGNOSIS — Z7952 Long term (current) use of systemic steroids: Secondary | ICD-10-CM | POA: Insufficient documentation

## 2021-04-11 DIAGNOSIS — I10 Essential (primary) hypertension: Secondary | ICD-10-CM | POA: Diagnosis not present

## 2021-04-11 DIAGNOSIS — Z7901 Long term (current) use of anticoagulants: Secondary | ICD-10-CM | POA: Diagnosis not present

## 2021-04-11 DIAGNOSIS — R079 Chest pain, unspecified: Secondary | ICD-10-CM | POA: Diagnosis not present

## 2021-04-11 DIAGNOSIS — J45909 Unspecified asthma, uncomplicated: Secondary | ICD-10-CM | POA: Insufficient documentation

## 2021-04-11 DIAGNOSIS — R9431 Abnormal electrocardiogram [ECG] [EKG]: Secondary | ICD-10-CM | POA: Diagnosis not present

## 2021-04-11 DIAGNOSIS — M545 Low back pain, unspecified: Secondary | ICD-10-CM | POA: Diagnosis present

## 2021-04-11 LAB — URINALYSIS, ROUTINE W REFLEX MICROSCOPIC
Bacteria, UA: NONE SEEN
Bilirubin Urine: NEGATIVE
Glucose, UA: NEGATIVE mg/dL
Ketones, ur: NEGATIVE mg/dL
Leukocytes,Ua: NEGATIVE
Nitrite: NEGATIVE
Protein, ur: NEGATIVE mg/dL
Specific Gravity, Urine: 1.006 (ref 1.005–1.030)
pH: 8 (ref 5.0–8.0)

## 2021-04-11 LAB — CBC
HCT: 41.2 % (ref 36.0–46.0)
Hemoglobin: 13.3 g/dL (ref 12.0–15.0)
MCH: 26.5 pg (ref 26.0–34.0)
MCHC: 32.3 g/dL (ref 30.0–36.0)
MCV: 82.2 fL (ref 80.0–100.0)
Platelets: 202 10*3/uL (ref 150–400)
RBC: 5.01 MIL/uL (ref 3.87–5.11)
RDW: 16.2 % — ABNORMAL HIGH (ref 11.5–15.5)
WBC: 2.5 10*3/uL — ABNORMAL LOW (ref 4.0–10.5)
nRBC: 0 % (ref 0.0–0.2)

## 2021-04-11 LAB — BASIC METABOLIC PANEL
Anion gap: 8 (ref 5–15)
BUN: 6 mg/dL — ABNORMAL LOW (ref 8–23)
CO2: 22 mmol/L (ref 22–32)
Calcium: 9.1 mg/dL (ref 8.9–10.3)
Chloride: 105 mmol/L (ref 98–111)
Creatinine, Ser: 0.82 mg/dL (ref 0.44–1.00)
GFR, Estimated: >60 mL/min (ref >60)
Glucose, Bld: 137 mg/dL — ABNORMAL HIGH (ref 70–99)
Potassium: 3.1 mmol/L — ABNORMAL LOW (ref 3.5–5.1)
Sodium: 135 mmol/L (ref 135–145)

## 2021-04-11 LAB — HEPATIC FUNCTION PANEL
ALT: 27 U/L (ref 0–44)
AST: 31 U/L (ref 15–41)
Albumin: 3.7 g/dL (ref 3.5–5.0)
Alkaline Phosphatase: 67 U/L (ref 38–126)
Bilirubin, Direct: <0.1 mg/dL (ref 0.0–0.2)
Total Bilirubin: 0.5 mg/dL (ref 0.3–1.2)
Total Protein: 7.3 g/dL (ref 6.5–8.1)

## 2021-04-11 LAB — TROPONIN I (HIGH SENSITIVITY)
Troponin I (High Sensitivity): 6 ng/L (ref <18)
Troponin I (High Sensitivity): 6 ng/L (ref <18)

## 2021-04-11 LAB — LIPASE, BLOOD: Lipase: 26 U/L (ref 11–51)

## 2021-04-11 MED ORDER — ACETAMINOPHEN 500 MG PO TABS
1000.0000 mg | ORAL_TABLET | Freq: Once | ORAL | Status: AC
  Administered 2021-04-11: 1000 mg via ORAL
  Filled 2021-04-11: qty 2

## 2021-04-11 MED ORDER — TRAMADOL HCL 50 MG PO TABS
50.0000 mg | ORAL_TABLET | Freq: Once | ORAL | Status: AC
  Administered 2021-04-11: 50 mg via ORAL
  Filled 2021-04-11: qty 1

## 2021-04-11 MED ORDER — TRAMADOL HCL 50 MG PO TABS: ORAL_TABLET | ORAL | 0 refills | Status: DC

## 2021-04-11 NOTE — ED Provider Notes (Signed)
[facility] EMERGENCY DEPARTMENT Provider Note   CSN: [id number] Arrival date & time: 04/11/21  0730     History Chief Complaint  Patient presents with   Back Pain   Chest Pain    [name] is a 70 y.o. female.  HPI Patient reports she is developed lower back pain since she was seen in the emergency department.  She was seen approximately 6 days ago.  At that time she reports she was having vomiting and upper abdominal pain.  She reports a lot of the symptoms seemed like they were related to her reflux and she has been taking Carafate and nausea medications.  She reports that symptom is improved and she is no longer having vomiting or significant amount of upper abdominal pain.  She now is having lower back pain and sometimes radiating into the buttocks and upper part of the legs.  She is not having weakness numbness or tingling into the legs.  She reports it seems like "once one thing goes wrong, everything goes wrong".  She is not sure if this back pain is directly related to her previous symptom, as that symptom seems to be improving.  No pain or burning with urination.  No unusual urinary frequency.  Patient denies she is having any significant cough or shortness of breath.  She had chest pain and triage note.  I asked about chest pain and she reports sometimes she gets pain in her chest, that is an occasional pain that she gets since she got atrial fibrillation.  She denies any chest pain currently.    Past Medical History:  Diagnosis Date   Asthma in adult, mild intermittent, uncomplicated    Chronic anticoagulation    Chronic constipation    Eczema    GERD (gastroesophageal reflux disease)    History of kidney stones    Hx of vaginal bleeding, resolved, ultrasound completed 10/11/2018   Hypertension    Personal history of atrial fibrillation, s/p ablation, now in NSR but remains on anticoaulation 11/26/2015   Followed in cardiology clinic     Patient  Active Problem List   Diagnosis Date Noted   H/O hypokalemia 10/29/2020   Abdominal aortic atherosclerosis ([place]) 10/22/2020   Seasonal and perennial allergic (presumed) rhinitis 07/25/2020   Asthma in adult, mild intermittent, uncomplicated    Chronic recurrent bilateral lower abdominal pain 07/24/2020   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 ([place]) 07/23/2020   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 02/14/2020   Primary osteoarthritis involving multiple joints 06/14/2018   Osteoporosis without current pathological fracture 04/15/2017   Constipation, chronic 10/19/2016   Bilateral hearing loss due to cerumen impaction 09/10/2016   Hyperlipidemia 03/18/2016   GERD (gastroesophageal reflux disease)    Personal history of atrial fibrillation, now s/p ablation in NSR 11/26/2015   Marijuana abuse 06/11/2015   Hypertension     Past Surgical History:  Procedure Laterality Date   ATRIAL FIBRILLATION ABLATION N/A 09/30/2017   Procedure: ATRIAL FIBRILLATION ABLATION;  Surgeon: Thompson Grayer, MD;  Location: [place] [facility];  Service: Cardiovascular;  Laterality: N/A;   CHOLECYSTECTOMY N/A 12/08/2018   Procedure: LAPAROSCOPIC CHOLECYSTECTOMY WITH INTRAOPERATIVE CHOLANGIOGRAM;  Surgeon: Jovita Kussmaul, MD;  Location: [place];  Service: General;  Laterality: N/A;   ERCP N/A 12/09/2018   Procedure: ENDOSCOPIC RETROGRADE CHOLANGIOPANCREATOGRAPHY (ERCP);  Surgeon: Carol Ada, MD;  Location: [place];  Service: Endoscopy;  Laterality: N/A;   SPHINCTEROTOMY  12/09/2018   Procedure: SPHINCTEROTOMY;  Surgeon: Benson Norway,  Saralyn Pilar, MD;  Location: [place];  Service: Endoscopy;;   TONSILLECTOMY     TUBAL LIGATION       OB History   No obstetric history on file.     Family History  Problem Relation Age of Onset   Heart attack Mother        Young age   Breast cancer Sister        Late 69s; now s/p mastectomy    Lung cancer Brother        x 2   Cancer Sister    Cancer Sister     Cancer Sister    Colon cancer Neg Hx    Colon polyps Neg Hx    Esophageal cancer Neg Hx    Rectal cancer Neg Hx    Stomach cancer Neg Hx     Social History   Tobacco Use   Smoking status: Former    Types: Cigarettes    Quit date: 10/24/2015    Years since quitting: 5.4   Smokeless tobacco: Never  Vaping Use   Vaping Use: Never used  Substance Use Topics   Alcohol use: Not Currently   Drug use: Not Currently    Types: Marijuana    Comment: Smoked marijuana in the past.    Home Medications Prior to Admission medications   Medication Sig Start Date End Date Taking? Authorizing Provider  traMADol (ULTRAM) 50 MG tablet 1 to 2 tablets every 6 hours with a dose of extra strength Tylenol for pain as needed 04/11/21  Yes Corrigan Kretschmer, Jeannie Done, MD  alendronate (FOSAMAX) 70 MG tablet Take 1 tablet (70 mg total) by mouth every 7 (seven) days. Take with a full glass of water on an empty stomach. 10/30/20 10/30/21  Angelica Pou, MD  alum & mag hydroxide-simeth (MAALOX MAX) [id number] MG/5ML suspension Take 10 mLs by mouth every 6 (six) hours as needed for indigestion. 02/03/21   Jacqlyn Larsen, PA-C  atorvastatin (LIPITOR) 20 MG tablet Take 20 mg by mouth daily. 12/21/20   [provider]  Azelastine-Fluticasone 137-50 MCG/ACT SUSP One spray in each nostril twice a day, every day Patient taking differently: Place 1 spray into both nostrils as needed. 07/24/20   Angelica Pou, MD  cetirizine (ZYRTEC ALLERGY) 10 MG tablet Take 1 tablet (10 mg total) by mouth daily. Patient taking differently: Take 10 mg by mouth daily as needed for allergies. 09/18/20 09/18/21  Angelica Pou, MD  Cholecalciferol (VITAMIN D) 50 MCG (2000 UT) tablet Take 1 tablet (2,000 Units total) by mouth daily. 10/30/20   Angelica Pou, MD  cyclobenzaprine (FLEXERIL) 5 MG tablet Take 5 mg by mouth 3 (three) times daily as needed. 10/13/20   [provider]  diltiazem (CARDIZEM) 60 MG tablet TAKE 1  TABLET BY MOUTH EVERY 6 HOURS AS NEEDED 11/05/20   Sherran Needs, NP  diltiazem (CARTIA XT) 120 MG 24 hr capsule Take 1 capsule (120 mg total) by mouth 2 (two) times daily. 01/26/21   Sherran Needs, NP  ezetimibe (ZETIA) 10 MG tablet Take 1 tablet (10 mg total) by mouth daily. 10/30/20 10/30/21  Angelica Pou, MD  famotidine (PEPCID) 20 MG tablet Take 1 tablet (20 mg total) by mouth 2 (two) times daily. 02/06/21 03/08/21  Angelica Pou, MD  fluticasone (FLOVENT HFA) 44 MCG/ACT inhaler Inhale 1 puff into the lungs daily. 09/18/20   Angelica Pou, MD  HYDROcodone-acetaminophen (NORCO/VICODIN) 5-325 MG tablet Take 1/2 tab to 1  tab if needed for days when you are experiencing severe pain. 04/09/21   Angelica Pou, MD  levalbuterol [facility] HFA) 45 MCG/ACT inhaler INHALE 1 PUFF INTO THE LUNGS EVERY 6 HOURS AS NEEDED FOR SHORTNESS OF BREATH Patient taking differently: Inhale 1 puff into the lungs every 6 (six) hours as needed for shortness of breath. 09/14/20   Riesa Pope, MD  linaclotide (LINZESS) 72 MCG capsule TAKE 1 CAPSULE(145 MCG) BY MOUTH DAILY BEFORE BREAKFAST 07/25/20   Angelica Pou, MD  losartan (COZAAR) 50 MG tablet Take 2 tablets (100 mg total) by mouth daily. 09/18/20 02/18/21  Angelica Pou, MD  montelukast (SINGULAIR) 10 MG tablet Take 1 tablet (10 mg total) by mouth at bedtime. 09/19/20   Angelica Pou, MD  ondansetron (ZOFRAN) 4 MG tablet Take 1 tablet (4 mg total) by mouth every 8 (eight) hours as needed for vomiting or nausea. 03/02/21   Angelica Pou, MD  pantoprazole (PROTONIX) 40 MG tablet Take 1 tablet (40 mg total) by mouth 2 (two) times daily before a meal. Take 1 tablet (40 mg total) by mouth 2 (two) times daily before breakfast and dinner 02/03/21   Jacqlyn Larsen, PA-C  rivaroxaban (XARELTO) 20 MG TABS tablet Take 1 tablet (20 mg total) by mouth daily with supper. 10/13/20   Sherran Needs, NP  sucralfate (CARAFATE) 1 g  tablet Take 1 tablet (1 g total) by mouth 4 (four) times daily -  with meals and at bedtime. 04/05/21   Hayden Rasmussen, MD  tiotropium (SPIRIVA) 18 MCG inhalation capsule Place 1 capsule (18 mcg total) into inhaler and inhale daily. 11/24/20   Angelica Pou, MD    Allergies    Albuterol, Percocet [oxycodone-acetaminophen], Celecoxib, Protonix [pantoprazole sodium], and Tramadol  Review of Systems   Review of Systems 10 systems reviewed and negative except as per HPI Physical Exam Updated Vital Signs BP (!) 151/83 (BP Location: Right Arm)   Pulse 68   Temp 98.7 F (37.1 C) (Oral)   Resp 20   Ht '5\' 7"'$  (1.702 m)   Wt 71.2 kg   SpO2 98%   BMI 24.58 kg/m   Physical Exam Constitutional:      Comments: Alert nontoxic well in appearance.  No acute distress.  HENT:     Mouth/Throat:     Pharynx: Oropharynx is clear.  Cardiovascular:     Rate and Rhythm: Normal rate and regular rhythm.  Pulmonary:     Effort: Pulmonary effort is normal.     Breath sounds: Normal breath sounds.  Abdominal:     General: There is no distension.     Palpations: Abdomen is soft.     Tenderness: There is no abdominal tenderness. There is no guarding.  Musculoskeletal:        General: No swelling or tenderness. Normal range of motion.     Right lower leg: No edema.     Left lower leg: No edema.     Comments: Patient Dors is some tenderness at the SI joint on the right.  Pain is reproduced by sitting forward in the stretcher and lying back.  With rolling onto her side for exam she felt a pain that radiated through the buttock and part way down the thigh.  Skin:    General: Skin is warm and dry.  Neurological:     General: No focal deficit present.     Mental Status: She is oriented to person, place, and time.  Motor: No weakness.     Coordination: Coordination normal.  Psychiatric:        Mood and Affect: Mood normal.    ED Results / Procedures / Treatments   Labs (all labs ordered are  listed, but only abnormal results are displayed) Labs Reviewed  BASIC METABOLIC PANEL - Abnormal; Notable for the following components:      Result Value   Potassium 3.1 (*)    Glucose, Bld 137 (*)    BUN 6 (*)    All other components within normal limits  CBC - Abnormal; Notable for the following components:   WBC 2.5 (*)    RDW 16.2 (*)    All other components within normal limits  URINALYSIS, ROUTINE W REFLEX MICROSCOPIC - Abnormal; Notable for the following components:   APPearance HAZY (*)    Hgb urine dipstick MODERATE (*)    All other components within normal limits  HEPATIC FUNCTION PANEL  LIPASE, BLOOD  TROPONIN I (HIGH SENSITIVITY)  TROPONIN I (HIGH SENSITIVITY)    EKG EKG Interpretation  Date/Time:  Saturday April 11 2021 07:45:49 EDT Ventricular Rate:  82 PR Interval:  116 QRS Duration: 84 QT Interval:  396 QTC Calculation: 462 R Axis:   61 Text Interpretation: Normal sinus rhythm Normal ECG no sig change from previous Confirmed by Charlesetta Shanks [phone]) on 04/11/2021 8:40:35 AM  Radiology No results found.  Procedures Procedures   Medications Ordered in ED Medications  acetaminophen (TYLENOL) tablet 1,000 mg (1,000 mg Oral Given 04/11/21 0921)  traMADol (ULTRAM) tablet 50 mg (50 mg Oral Given 04/11/21 [id number])    ED Course  I have reviewed the triage vital signs and the nursing notes.  Pertinent labs & imaging results that were available during my care of the patient were reviewed by me and considered in my medical decision making (see chart for details).    MDM Rules/Calculators/A&P                           Patient presents for lower back pain.  Pain is radiating into her buttocks greater on the right than left.  She does not have weakness or numbness to suggest emergent back pain condition.  Patient has had CT scan February 2022 with multiple areas of mild to moderate disc protrusion.  No areas of significant spinal stenosis.  At this time patient  has no neurologic dysfunction.  She is otherwise clinically well in appearance.  At this time we will plan for pain control with acetaminophen and tramadol. Final Clinical Impression(s) / ED Diagnoses Final diagnoses:  Acute bilateral low back pain with right-sided sciatica    Rx / DC Orders ED Discharge Orders          Ordered    traMADol (ULTRAM) 50 MG tablet        04/11/21 1030             Charlesetta Shanks, MD 04/11/21 1031

## 2021-04-11 NOTE — ED Triage Notes (Signed)
Pt reports lower abd pain that radiates to R hip x 2 days and pain to center of chest that started this morning.  Denies any other associated symptoms.

## 2021-04-11 NOTE — Discharge Instructions (Addendum)
1.  Take extra strength Tylenol every 6 hours for pain.  You have also been prescribed tramadol.  You may take 1-2 tramadol tablets for additional pain control if needed with your acetaminophen dosing. 2.  Follow-up with your doctor for recheck in 3 to 5 days. 3.  Return to the emergency department if you get weakness or numbness in the legs, loss of control of your bowel or bladder or other concerning symptoms

## 2021-04-13 MED ORDER — HYDROCODONE-ACETAMINOPHEN 5-325 MG PO TABS: ORAL_TABLET | ORAL | 0 refills | Status: DC

## 2021-04-13 NOTE — Telephone Encounter (Signed)
Patient called again for refill on Hydrocodone to be sent to [facility] on [address] as she is unable to get it at the [facility] location.

## 2021-04-15 ENCOUNTER — Telehealth: Payer: Self-pay

## 2021-04-15 NOTE — Telephone Encounter (Signed)
TC to patient, she was informed that the Hydrocodone RX was sent to the [facility] on [address] as she requested 2 days ago.  She states the RX was supposed to go to the [place] on [address].  RN informed patient there are 2 notes from 8/22 which state the patient specified the [facility] on [facility] and instructed patient to call that pharmacy.  Pharmacy phone number was provided.  Pt appreciative. SChaplin, RN,BSN

## 2021-04-15 NOTE — Telephone Encounter (Signed)
HYDROcodone-acetaminophen (NORCO/VICODIN) 5-325 MG tablet, REFILL REQUEST @ [facility] ON [address].

## 2021-04-16 ENCOUNTER — Ambulatory Visit (INDEPENDENT_AMBULATORY_CARE_PROVIDER_SITE_OTHER): Payer: Medicare Other | Admitting: Internal Medicine

## 2021-04-16 VITALS — BP 155/90 | HR 77 | Temp 98.4°F | Ht 67.0 in | Wt 143.5 lb

## 2021-04-16 DIAGNOSIS — R1032 Left lower quadrant pain: Secondary | ICD-10-CM

## 2021-04-16 DIAGNOSIS — M8949 Other hypertrophic osteoarthropathy, multiple sites: Secondary | ICD-10-CM | POA: Diagnosis not present

## 2021-04-16 DIAGNOSIS — E7841 Elevated Lipoprotein(a): Secondary | ICD-10-CM | POA: Diagnosis not present

## 2021-04-16 DIAGNOSIS — M159 Polyosteoarthritis, unspecified: Secondary | ICD-10-CM

## 2021-04-16 DIAGNOSIS — M545 Low back pain, unspecified: Secondary | ICD-10-CM | POA: Diagnosis not present

## 2021-04-16 DIAGNOSIS — J4520 Mild intermittent asthma, uncomplicated: Secondary | ICD-10-CM

## 2021-04-16 DIAGNOSIS — G8929 Other chronic pain: Secondary | ICD-10-CM

## 2021-04-16 DIAGNOSIS — Z8679 Personal history of other diseases of the circulatory system: Secondary | ICD-10-CM | POA: Diagnosis not present

## 2021-04-16 DIAGNOSIS — I10 Essential (primary) hypertension: Secondary | ICD-10-CM | POA: Diagnosis not present

## 2021-04-16 DIAGNOSIS — M150 Primary generalized (osteo)arthritis: Secondary | ICD-10-CM

## 2021-04-16 DIAGNOSIS — K5909 Other constipation: Secondary | ICD-10-CM | POA: Diagnosis not present

## 2021-04-16 DIAGNOSIS — M810 Age-related osteoporosis without current pathological fracture: Secondary | ICD-10-CM | POA: Diagnosis not present

## 2021-04-16 DIAGNOSIS — R1031 Right lower quadrant pain: Secondary | ICD-10-CM | POA: Diagnosis not present

## 2021-04-16 DIAGNOSIS — J302 Other seasonal allergic rhinitis: Secondary | ICD-10-CM

## 2021-04-16 DIAGNOSIS — J3089 Other allergic rhinitis: Secondary | ICD-10-CM | POA: Diagnosis not present

## 2021-04-16 DIAGNOSIS — D6869 Other thrombophilia: Secondary | ICD-10-CM

## 2021-04-16 DIAGNOSIS — I4891 Unspecified atrial fibrillation: Secondary | ICD-10-CM

## 2021-04-16 DIAGNOSIS — K219 Gastro-esophageal reflux disease without esophagitis: Secondary | ICD-10-CM | POA: Diagnosis not present

## 2021-04-16 DIAGNOSIS — Z8639 Personal history of other endocrine, nutritional and metabolic disease: Secondary | ICD-10-CM

## 2021-04-16 MED ORDER — PANTOPRAZOLE SODIUM 40 MG PO TBEC: 40.0000 mg | DELAYED_RELEASE_TABLET | Freq: Two times a day (BID) | ORAL | 3 refills | Status: DC

## 2021-04-16 NOTE — Progress Notes (Signed)
FEeling better, presented to ED with vomiting and chest pain due to the strain of vomiting, new sucralfate qid has in her mind eased/resolved her nausea and is now feeling great. ED also added protonix to pepcid.  No heartburn, backwash, nocturnal cough, nausea, or abdominal pain.  Eating normally.  I called pain medicine, she picked it up   Wants to change pharmacy to [facility] on [address].  Lots of stress at home, death in family.  Partner is in poor health. She has unfulfilled libido and mourns absence of her sex life.    Takes linzess and the MOM, no longer taking miralax.  Constipation is managed which she notes also helps prevent her back and abdominal pain.  Legs are doing great, taking pain medicine much less frequently!  Has had 3 vaccines for Covid and presents her cards for scanning today.;  BP (!) 155/90 (BP Location: Right Arm, Patient Position: Sitting, Cuff Size: Small)   Pulse 77   Temp 98.4 F (36.9 C) (Oral)   Ht '5\' 7"'$  (1.702 m)   Wt 143 lb 8 oz (65.1 kg)   SpO2 100%   BMI 22.48 kg/m   Smartly dressed and groomed as usual.  Calm and composed, nothing is acutely bothering her today.  Lungs clear throughout, no wheeze or rales.  Heart RRR, no murmur.  No JVD.  No LE edema.  DP pulses 2+.  Assessment and plan:  See problem based documentation. Flu shot and new Covid booster (not yet released) in next couple of months.  We have not yet discussed Shingrix.  Changes today:  Add spironolactone 25 mg daily for HTN and hypokalemia.  Increase atorvastatin from 20 to 40 mg for persistent  hyperlipidemia despite addition of ezetemibe (she has been very cautious about feared side effects of statins).    Next visit recheck BP and BMP.

## 2021-04-16 NOTE — Patient Instructions (Signed)
Ms. Diemert, I'm glad to know you're feeling so much better after your emergency room visits.    We discussed your medications, and we agreed that you will continue to take the new (from Emergency) Protonix 40 mg, 1 twice a day.  You will also take the carafate up to 4 times a day, which you said helped with the nausea and reflux.    You can stop the Pepcid.  You are not likely to need botht eh Pepcid and the Protonix.  We are changing your pharmacy to the [place] on [address].  Today we are rechecking your cholesterol, to see if your new medicine is working.  I will call you with the result.  Take care and stay well.  I'm so sorry that your family is going through the grief of loss.  Dr. Jimmye Norman

## 2021-04-17 LAB — LIPID PANEL
Chol/HDL Ratio: 3.7 ratio (ref 0.0–4.4)
Cholesterol, Total: 227 mg/dL — ABNORMAL HIGH (ref 100–199)
HDL: 62 mg/dL (ref >39)
LDL Chol Calc (NIH): 140 mg/dL — ABNORMAL HIGH (ref 0–99)
Triglycerides: 143 mg/dL (ref 0–149)
VLDL Cholesterol Cal: 25 mg/dL (ref 5–40)

## 2021-04-17 MED ORDER — MILK OF MAGNESIA 7.75 % PO SUSP
30.0000 mL | Freq: Every day | ORAL | 3 refills | Status: DC | PRN
Start: 2021-04-17 — End: 2021-09-22

## 2021-04-17 MED ORDER — SPIRONOLACTONE 25 MG PO TABS: 25.0000 mg | ORAL_TABLET | Freq: Every day | ORAL | 3 refills | Status: DC

## 2021-04-17 MED ORDER — ATORVASTATIN CALCIUM 40 MG PO TABS: 40.0000 mg | ORAL_TABLET | Freq: Every day | ORAL | 3 refills | Status: DC

## 2021-04-17 NOTE — Assessment & Plan Note (Signed)
Minimally symptomatic.  Lungs clear today.  Continue current regimen.

## 2021-04-17 NOTE — Assessment & Plan Note (Signed)
K3.1 at recent ED visit for which she was given a potassium tablet.  On no potassium wasting medications.  I have not determine the etiology.  She notes that she has been on potassium supplementation in the past.

## 2021-04-17 NOTE — Assessment & Plan Note (Signed)
She has found best results with Linzess and milk of magnesia and is no longer using the MiraLAX as she identified it as ineffective.  She does associate constipation with recurrent back pain which is relieved with bowel movement.

## 2021-04-17 NOTE — Assessment & Plan Note (Signed)
This is most attributable to constipation which is being treated.

## 2021-04-17 NOTE — Assessment & Plan Note (Signed)
Presented to the emergency room recently with nausea and vomiting which was treated empirically as GERD with addition of PPI to her H2 be and addition of sucralfate.  Symptoms completely resolved.  She is tolerating Protonix, to which she had a registered allergy in the past (reporting palpitations, though I doubt if her palpitations were related to the medicine).  She agrees to continue the PPI for now and I have asked her in the meantime to stop her H2B as she probably does not need both acid blockers.  She associates the Carafate as "the best medicine she has ever had for nausea ".  Many tablets left.  I have not refilled at this time.

## 2021-04-17 NOTE — Assessment & Plan Note (Signed)
"  It is not bothering me lately, I have been doing really well".  She does have opioid pain medicine which she takes infrequently.

## 2021-04-17 NOTE — Assessment & Plan Note (Addendum)
Due for recheck.  Primary prevention (secondary, if radiographic aortic atherosclerosis is noted).  At last visit she had reported myalgia which she related to her statin, and we discussed Zetia as an alternative.  Since that time, she seems to have forgotten that she had a problem with her statin and states that she is taking it.  I will try to encourage her to double the dose from 20 to 40 mg.  SHe has hx of sensitivity to most medicines particularly when started, and has insurance employed health staff informing her of side effects of which she should be aware.  She is very cautious.

## 2021-04-17 NOTE — Assessment & Plan Note (Signed)
"  I am doing pretty good with my joint pain right now, it has not been bothering me "

## 2021-04-17 NOTE — Assessment & Plan Note (Signed)
In sinus rhythm today.  She continues to take her Cardizem scheduled, and an as needed dose when she senses palpitations as prescriber by her cardiologist.  She remains anticoagulated.

## 2021-04-17 NOTE — Assessment & Plan Note (Signed)
Remains adherent to anticoagulation with no bleeding complications.

## 2021-04-17 NOTE — Assessment & Plan Note (Signed)
She continues to be troubled by nasal congestion.  I advised her to continue with her current regimen which is maximized.  She accepts that this will likely be a lifelong problem.

## 2021-04-17 NOTE — Assessment & Plan Note (Addendum)
Insufficient control on current regimen of losartan 100 mg daily (insurance covers 50 mg tabs only) and diltiazem 120 mg daily.  Given her chronic low potassium, spironolactone would be a good choice.  SHe was prescribed this for the same reason in the past, it was discontinued due to nonadherence.

## 2021-04-17 NOTE — Assessment & Plan Note (Signed)
She is taking vitamin D and remains adherent to her Fosamax without problems.  I do not associate her recent emergency room trip for GERD to the bisphosphonate based on her report of timing of symptoms.

## 2021-04-20 IMAGING — CT CT CTA ABD/PEL W/CM AND/OR W/O CM
2 of 10 series · 12 of 46 positions shown, 17 images · IV contrast (APPLIED)
Comparison: 11/30/2019

CLINICAL DATA: Right lower quadrant abdominal pain for

EXAM:
CTA ABDOMEN AND PELVIS WITH CONTRAST
TECHNIQUE: Multidetector CT imaging of the abdomen and pelvis was performed
using the standard protocol during bolus administration of
intravenous contrast. Multiplanar reconstructed images and MIPs were
obtained and reviewed to evaluate the vascular anatomy.
CONTRAST:  100mL OMNIPAQUE IOHEXOL 350 MG/ML SOLN

[Series 7: cor · coronal · 0.64mm/px · 2 of 120 slices shown]
[im 40/120  soft-tissue]
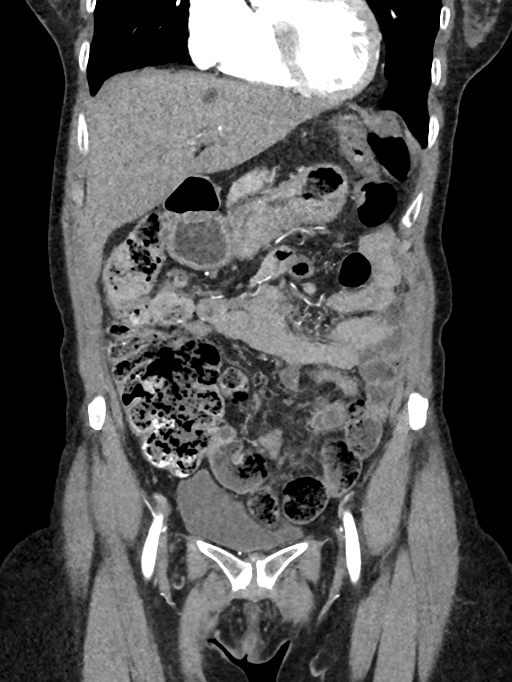
[im 80/120  soft-tissue]
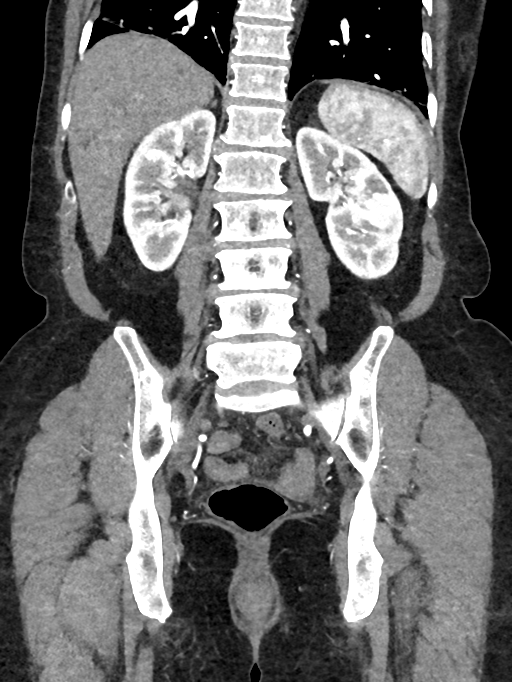

[Series 13: venous thins · axial · portal-venous · 0.68mm/px · z∈[-441,-77]mm · 10 of 1095 slices shown, 15 images]
[im 92/1095  soft-tissue]
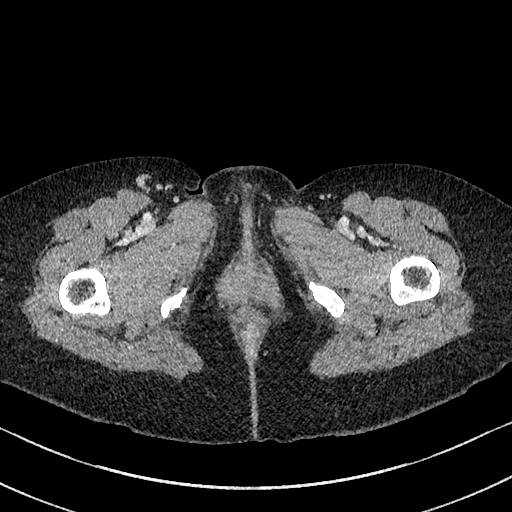
[im 92/1095  bone]
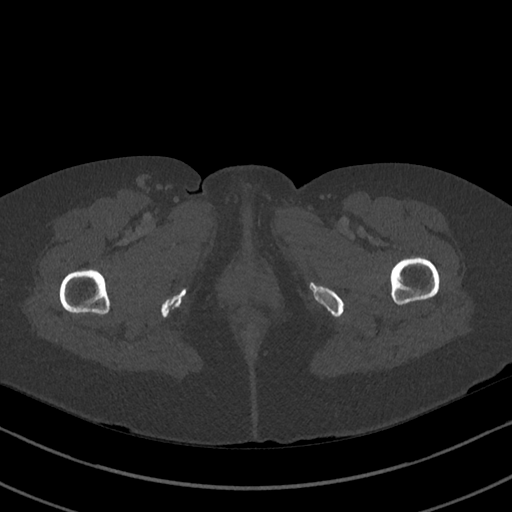
[im 183/1095  soft-tissue]
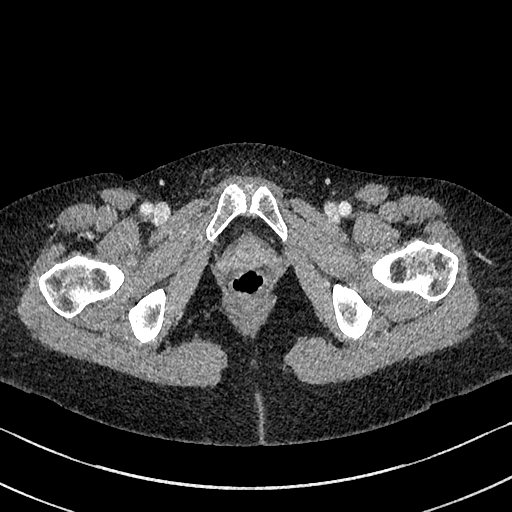
[im 365/1095  soft-tissue]
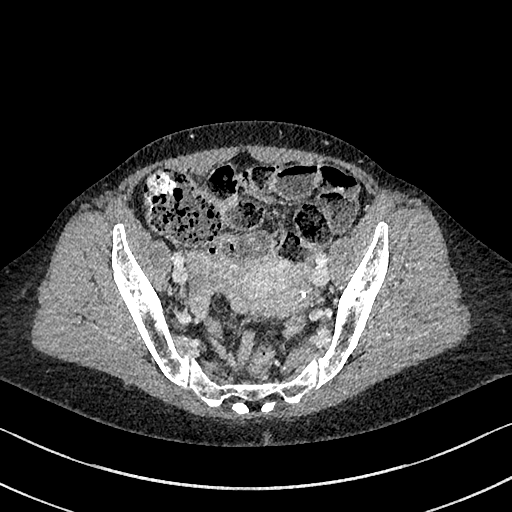
[im 456/1095  soft-tissue]
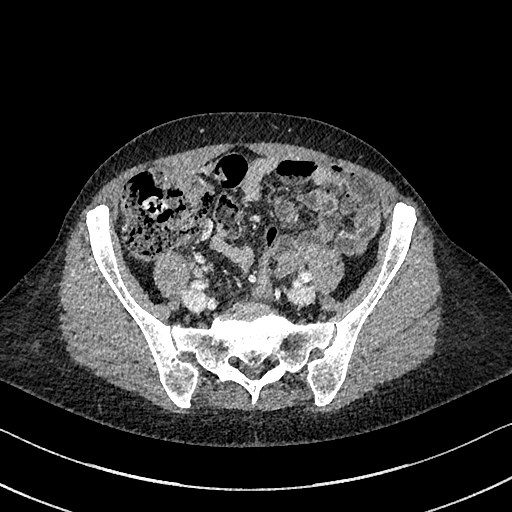
[im 548/1095  soft-tissue]
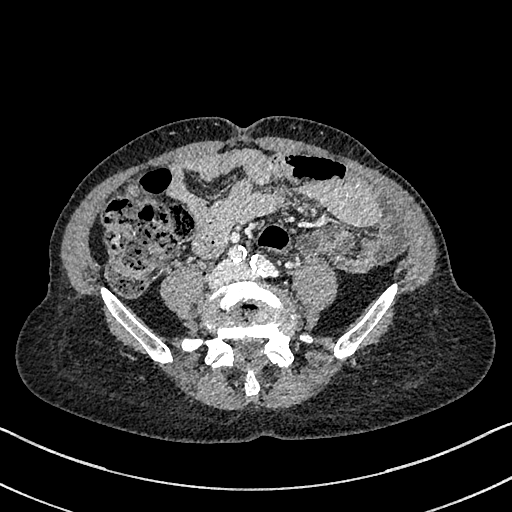
[im 639/1095  soft-tissue]
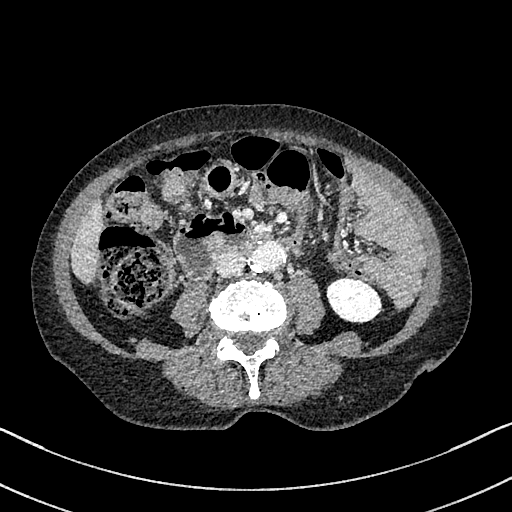
[im 730/1095  soft-tissue]
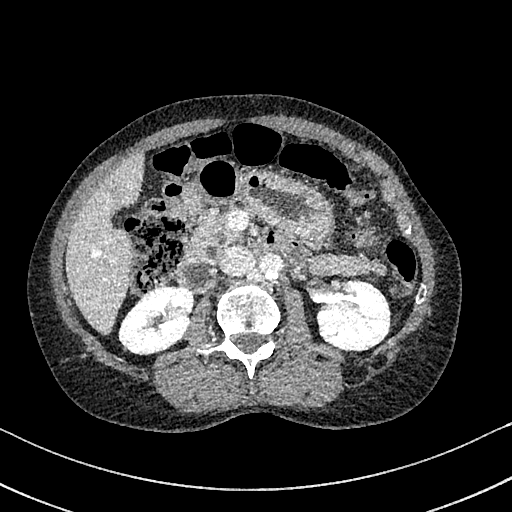
[im 730/1095  lung]
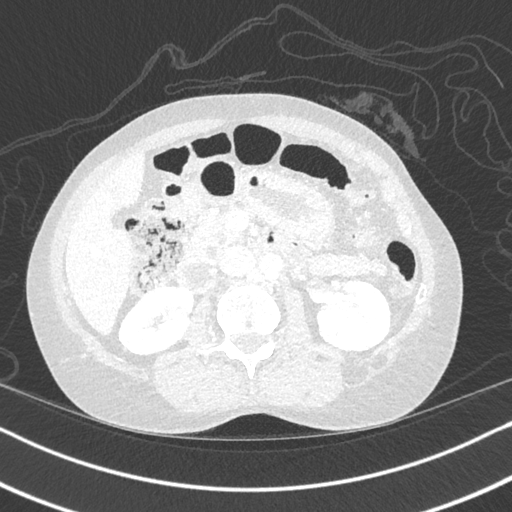
[im 821/1095  lung]
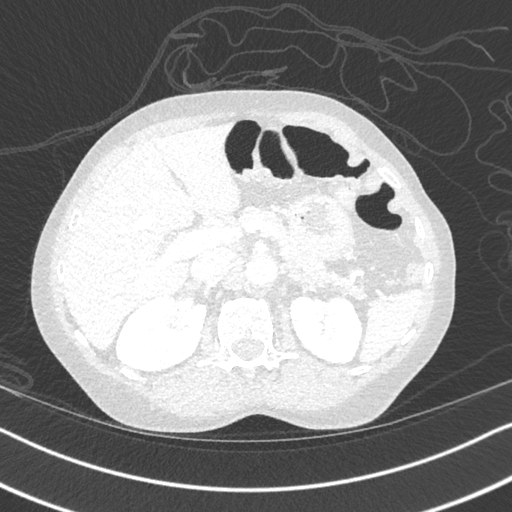
[im 912/1095  soft-tissue]
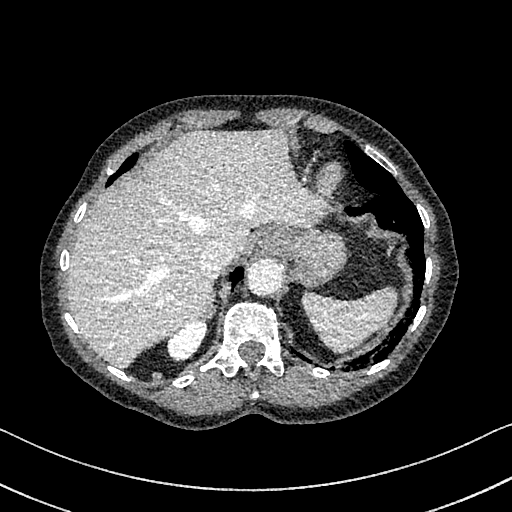
[im 912/1095  lung]
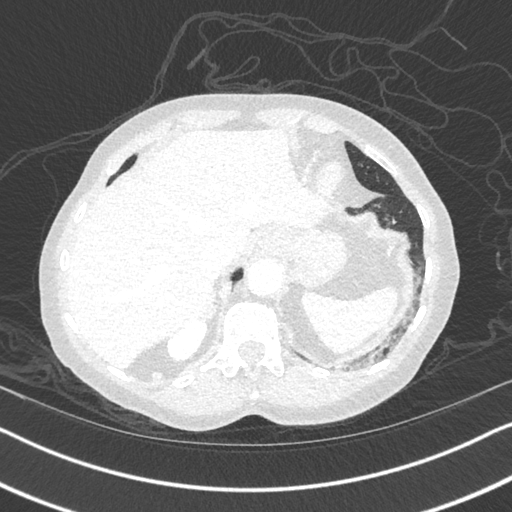
[im 1003/1095  soft-tissue]
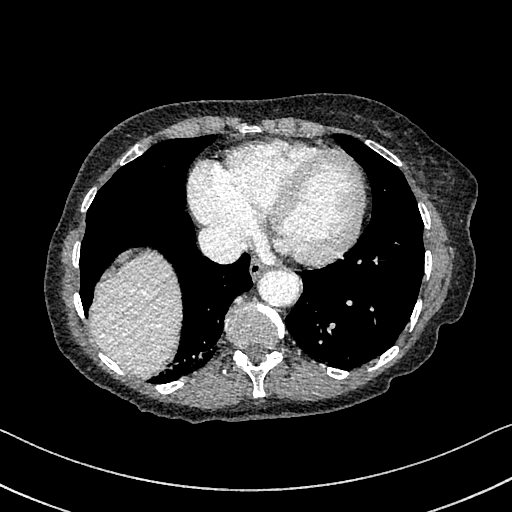
[im 1003/1095  lung]
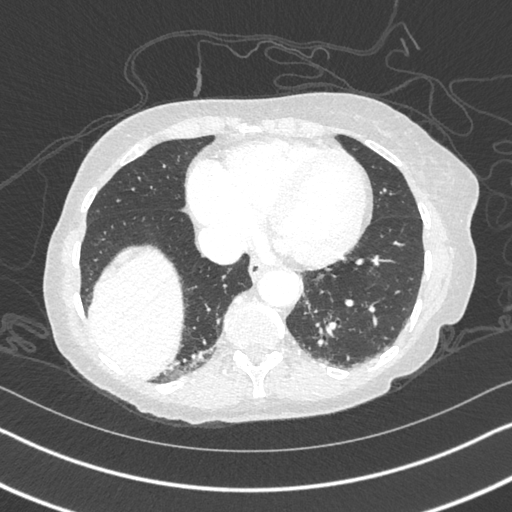
[im 1003/1095  bone]
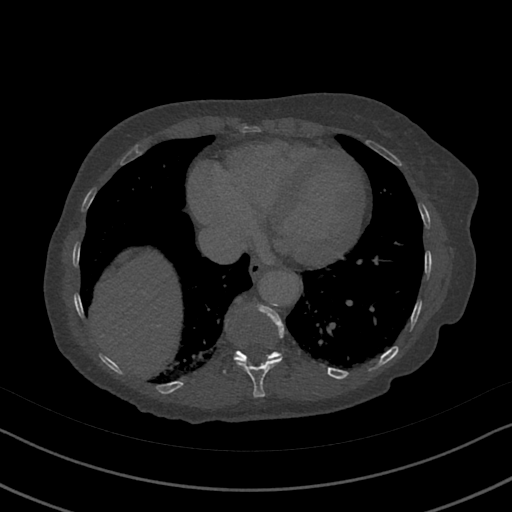

[12 of 46 positions shown; findings below may reference images not displayed]

FINDINGS: VASCULAR

Aorta: moderate partially calcified plaque throughout. Infrarenal
segment 2.7 cm maximum diameter. No dissection or stenosis.

Celiac: Patent without evidence of aneurysm, dissection, vasculitis
or significant stenosis.

SMA: Widely patent. Replaced right hepatic arterial supply, an
anatomic variant.

Renals: Single left, patent. 3 right renal arteries of similar
caliber, all patent

IMA: Patent without evidence of aneurysm, dissection, vasculitis or
significant stenosis.

Inflow: Calcified plaque in bilateral common and proximal internal
iliac arteries without aneurysm or stenosis. External iliac arteries
patent.

Proximal Outflow: Minimal plaque. No significant stenosis.

Veins: Patent hepatic veins, portal vein, SMV, splenic vein,
bilateral renal veins, IVC, iliac venous system. Circumaortic left
renal vein, an anatomic variant. No venous pathology evident.

Review of the MIP images confirms the above findings.

NON-VASCULAR

Lower chest: Dependent atelectasis or scarring posteriorly in the
visualized lung bases. No pleural or pericardial effusion.

Hepatobiliary: Stable left hepatic cysts. Status post
cholecystectomy. Prominent CBD as before.

Pancreas: Mildly dilated pancreatic duct throughout its length,
stable. No mass or inflammatory change.

Spleen: Normal in size without focal abnormality.

Adrenals/Urinary Tract: Bilateral adrenal hyperplasia . Kidneys
unremarkable. No hydronephrosis. Urinary bladder incompletely
distended.

Stomach/Bowel: Stomach is nondistended. Distal duodenal small
diverticulum. Small bowel is nondistended. Appendix not discretely
identified. Moderate proximal colonic fecal material, decompressed
distally without wall thickening or adjacent inflammatory/edematous
change.

Lymphatic: No abdominal or pelvic adenopathy.

Reproductive: Uterus and bilateral adnexa are unremarkable.

Other: No ascites. Bilateral pelvic phleboliths. No free air.

Musculoskeletal: Multilevel lumbar spondylitic change. Normal
alignment. No fracture or worrisome bone lesion.
IMPRESSION: No significant proximal mesenteric arterial occlusive disease or
acute findings.

Aortic Atherosclerosis ([id number].S).

## 2021-04-21 ENCOUNTER — Emergency Department (HOSPITAL_COMMUNITY)
Admission: EM | Admit: 2021-04-21 | Discharge: 2021-04-22 | Disposition: A | Discharge status: Home / Self Care | Payer: Medicare Other | Attending: Emergency Medicine | Admitting: Emergency Medicine

## 2021-04-21 ENCOUNTER — Emergency Department (HOSPITAL_COMMUNITY): Payer: Medicare Other

## 2021-04-21 DIAGNOSIS — R079 Chest pain, unspecified: Secondary | ICD-10-CM | POA: Insufficient documentation

## 2021-04-21 DIAGNOSIS — R197 Diarrhea, unspecified: Secondary | ICD-10-CM | POA: Insufficient documentation

## 2021-04-21 DIAGNOSIS — Z7901 Long term (current) use of anticoagulants: Secondary | ICD-10-CM | POA: Insufficient documentation

## 2021-04-21 DIAGNOSIS — J4520 Mild intermittent asthma, uncomplicated: Secondary | ICD-10-CM | POA: Insufficient documentation

## 2021-04-21 DIAGNOSIS — Z79899 Other long term (current) drug therapy: Secondary | ICD-10-CM | POA: Insufficient documentation

## 2021-04-21 DIAGNOSIS — Z7951 Long term (current) use of inhaled steroids: Secondary | ICD-10-CM | POA: Insufficient documentation

## 2021-04-21 DIAGNOSIS — M549 Dorsalgia, unspecified: Secondary | ICD-10-CM | POA: Diagnosis not present

## 2021-04-21 DIAGNOSIS — R42 Dizziness and giddiness: Secondary | ICD-10-CM | POA: Insufficient documentation

## 2021-04-21 DIAGNOSIS — R1084 Generalized abdominal pain: Secondary | ICD-10-CM | POA: Diagnosis not present

## 2021-04-21 DIAGNOSIS — R110 Nausea: Secondary | ICD-10-CM

## 2021-04-21 DIAGNOSIS — I10 Essential (primary) hypertension: Secondary | ICD-10-CM | POA: Diagnosis not present

## 2021-04-21 DIAGNOSIS — R112 Nausea with vomiting, unspecified: Secondary | ICD-10-CM | POA: Insufficient documentation

## 2021-04-21 DIAGNOSIS — Z87891 Personal history of nicotine dependence: Secondary | ICD-10-CM | POA: Insufficient documentation

## 2021-04-21 DIAGNOSIS — Z743 Need for continuous supervision: Secondary | ICD-10-CM | POA: Diagnosis not present

## 2021-04-21 DIAGNOSIS — R404 Transient alteration of awareness: Secondary | ICD-10-CM | POA: Diagnosis not present

## 2021-04-21 DIAGNOSIS — R1110 Vomiting, unspecified: Secondary | ICD-10-CM | POA: Diagnosis not present

## 2021-04-21 DIAGNOSIS — R109 Unspecified abdominal pain: Secondary | ICD-10-CM | POA: Diagnosis not present

## 2021-04-21 DIAGNOSIS — R6889 Other general symptoms and signs: Secondary | ICD-10-CM | POA: Diagnosis not present

## 2021-04-21 LAB — BASIC METABOLIC PANEL
Anion gap: 8 (ref 5–15)
BUN: 6 mg/dL — ABNORMAL LOW (ref 8–23)
CO2: 22 mmol/L (ref 22–32)
Calcium: 9.5 mg/dL (ref 8.9–10.3)
Chloride: 107 mmol/L (ref 98–111)
Creatinine, Ser: 0.79 mg/dL (ref 0.44–1.00)
GFR, Estimated: >60 mL/min (ref >60)
Glucose, Bld: 123 mg/dL — ABNORMAL HIGH (ref 70–99)
Potassium: 3.3 mmol/L — ABNORMAL LOW (ref 3.5–5.1)
Sodium: 137 mmol/L (ref 135–145)

## 2021-04-21 LAB — CBC
HCT: 41.1 % (ref 36.0–46.0)
Hemoglobin: 13.1 g/dL (ref 12.0–15.0)
MCH: 26.3 pg (ref 26.0–34.0)
MCHC: 31.9 g/dL (ref 30.0–36.0)
MCV: 82.5 fL (ref 80.0–100.0)
Platelets: 284 10*3/uL (ref 150–400)
RBC: 4.98 MIL/uL (ref 3.87–5.11)
RDW: 15.9 % — ABNORMAL HIGH (ref 11.5–15.5)
WBC: 4.6 10*3/uL (ref 4.0–10.5)
nRBC: 0 % (ref 0.0–0.2)

## 2021-04-21 LAB — URINALYSIS, ROUTINE W REFLEX MICROSCOPIC
Bacteria, UA: NONE SEEN
Bilirubin Urine: NEGATIVE
Glucose, UA: NEGATIVE mg/dL
Ketones, ur: 5 mg/dL — AB
Leukocytes,Ua: NEGATIVE
Nitrite: NEGATIVE
Protein, ur: NEGATIVE mg/dL
Specific Gravity, Urine: 1.008 (ref 1.005–1.030)
pH: 7 (ref 5.0–8.0)

## 2021-04-21 LAB — CBG MONITORING, ED: Glucose-Capillary: 109 mg/dL — ABNORMAL HIGH (ref 70–99)

## 2021-04-21 MED ORDER — ONDANSETRON HCL 4 MG/2ML IJ SOLN
4.0000 mg | Freq: Once | INTRAMUSCULAR | Status: AC
  Administered 2021-04-21: 4 mg via INTRAVENOUS
  Filled 2021-04-21: qty 2

## 2021-04-21 MED ORDER — MORPHINE SULFATE (PF) 4 MG/ML IV SOLN
4.0000 mg | Freq: Once | INTRAVENOUS | Status: AC
  Administered 2021-04-21: 4 mg via INTRAVENOUS
  Filled 2021-04-21: qty 1

## 2021-04-21 MED ORDER — IOHEXOL 350 MG/ML SOLN
50.0000 mL | Freq: Once | INTRAVENOUS | Status: AC | PRN
  Administered 2021-04-21: 50 mL via INTRAVENOUS

## 2021-04-21 MED ORDER — SODIUM CHLORIDE 0.9 % IV BOLUS
1000.0000 mL | Freq: Once | INTRAVENOUS | Status: AC
  Administered 2021-04-21: 1000 mL via INTRAVENOUS

## 2021-04-21 MED ORDER — DICYCLOMINE HCL 10 MG/ML IM SOLN
20.0000 mg | Freq: Once | INTRAMUSCULAR | Status: AC
  Administered 2021-04-21: 20 mg via INTRAMUSCULAR
  Filled 2021-04-21: qty 2

## 2021-04-21 NOTE — ED Notes (Signed)
Pt in MRI.

## 2021-04-21 NOTE — ED Triage Notes (Signed)
Pt here via EMS with c/o dizziness since 0600 8/30 Pt woke up feeling dizzy and naseua. Took home zofran with improvement.   162/100 P 76 96% RA CBG 146

## 2021-04-21 NOTE — ED Provider Notes (Signed)
Emergency Medicine Provider Triage Evaluation Note  [name] , a 70 y.o. female  was evaluated in triage.  Pt complains of dizzy.  Review of Systems  Positive: Dizzy, back pain, nausea Negative: Fever, rash, dysuria  Physical Exam  BP (!) 155/94 (BP Location: Right Arm)   Pulse 77   Temp 99.1 F (37.3 C) (Oral)   Resp 14   SpO2 100%  Gen:   Awake, no distress   Resp:  Normal effort  MSK:   Moves extremities without difficulty  Other:    Medical Decision Making  Medically screening exam initiated at 9:04 AM.  Appropriate orders placed.  [name] was informed that the remainder of the evaluation will be completed by another provider, this initial triage assessment does not replace that evaluation, and the importance of remaining in the ED until their evaluation is complete.  Pt report feeling dizzy and lightheadedness this AM when she got up.  She attributed her sxs to a multitude of daily meds that she takes.  Denies headache.    Domenic Moras, PA-C 04/21/21 [id number]    Truddie Hidden, MD 04/21/21 [phone]

## 2021-04-21 NOTE — ED Notes (Signed)
Pt's bed wasn't there when lab came to collect blood. Will try again later

## 2021-04-21 NOTE — ED Notes (Signed)
Pt to xray

## 2021-04-21 NOTE — ED Provider Notes (Signed)
[facility] EMERGENCY DEPARTMENT Provider Note   CSN: [id number] Arrival date & time: 04/21/21  [id number]     History Chief Complaint  Patient presents with   Dizziness    [name] is a 70 y.o. female with a past medical history significant for GERD, hypertension, history of atrial fib on chronic Xarelto who presents to the ED due to acute dizziness that started earlier today.  Patient describes dizziness as feeling off balance.  Patient denies visual changes, speech changes, unilateral weakness.  Patient also endorses diffuse abdominal pain associated with nausea.  She admits to 1 episode of nonbloody, nonbilious emesis earlier today.  She admits to 3 days of nonbloody diarrhea.  She also endorses 1 episode of chest pain while waiting in the waiting room.  Patient states she has an issue with constipation and diarrhea and is currently on Linzess and other medications with no improvement in symptoms.  No fever or chills.  Denies urinary and vaginal symptoms.  No treatment prior to arrival.  No aggravating or alleviating factors.  History obtained from patient and past medical records. No interpreter used during encounter.       Past Medical History:  Diagnosis Date   Asthma in adult, mild intermittent, uncomplicated    Chronic anticoagulation    Chronic constipation    Eczema    GERD (gastroesophageal reflux disease)    History of kidney stones    Hx of vaginal bleeding, resolved, ultrasound completed 10/11/2018   Hypertension    Personal history of atrial fibrillation, s/p ablation, now in NSR but remains on anticoaulation 11/26/2015   Followed in cardiology clinic     Patient Active Problem List   Diagnosis Date Noted   H/O hypokalemia 10/29/2020   Abdominal aortic atherosclerosis ([place]) 10/22/2020   Seasonal and perennial allergic (presumed) rhinitis 07/25/2020   Asthma in adult, mild intermittent, uncomplicated    Chronic recurrent bilateral lower  abdominal pain 07/24/2020   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 ([place]) 07/23/2020   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 02/14/2020   Primary osteoarthritis involving multiple joints 06/14/2018   Osteoporosis without current pathological fracture 04/15/2017   Constipation, chronic 10/19/2016   Bilateral hearing loss due to cerumen impaction 09/10/2016   Hyperlipidemia 03/18/2016   GERD (gastroesophageal reflux disease)    Personal history of atrial fibrillation, now s/p ablation in NSR 11/26/2015   Marijuana abuse 06/11/2015   Hypertension     Past Surgical History:  Procedure Laterality Date   ATRIAL FIBRILLATION ABLATION N/A 09/30/2017   Procedure: ATRIAL FIBRILLATION ABLATION;  Surgeon: Thompson Grayer, MD;  Location: [facility];  Service: Cardiovascular;  Laterality: N/A;   CHOLECYSTECTOMY N/A 12/08/2018   Procedure: LAPAROSCOPIC CHOLECYSTECTOMY WITH INTRAOPERATIVE CHOLANGIOGRAM;  Surgeon: Jovita Kussmaul, MD;  Location: [facility];  Service: General;  Laterality: N/A;   ERCP N/A 12/09/2018   Procedure: ENDOSCOPIC RETROGRADE CHOLANGIOPANCREATOGRAPHY (ERCP);  Surgeon: Carol Ada, MD;  Location: [facility];  Service: Endoscopy;  Laterality: N/A;   SPHINCTEROTOMY  12/09/2018   Procedure: SPHINCTEROTOMY;  Surgeon: Carol Ada, MD;  Location: [facility] END[facility];  Service: Endoscopy;;   TONSILLECTOMY     TUBAL LIGATION       OB History   No obstetric history on file.     Family History  Problem Relation Age of Onset   Heart attack Mother        Young age   Breast cancer Sister        Late 22s;  now s/p mastectomy    Lung cancer Brother        x 2   Cancer Sister    Cancer Sister    Cancer Sister    Colon cancer Neg Hx    Colon polyps Neg Hx    Esophageal cancer Neg Hx    Rectal cancer Neg Hx    Stomach cancer Neg Hx     Social History   Tobacco Use   Smoking status: Former    Types: Cigarettes    Quit date: 10/24/2015    Years since quitting: 5.4    Smokeless tobacco: Never  Vaping Use   Vaping Use: Never used  Substance Use Topics   Alcohol use: Not Currently   Drug use: Not Currently    Types: Marijuana    Comment: Smoked marijuana in the past.    Home Medications Prior to Admission medications   Medication Sig Start Date End Date Taking? Authorizing Provider  ondansetron (ZOFRAN ODT) 4 MG disintegrating tablet Take 1 tablet (4 mg total) by mouth every 8 (eight) hours as needed for nausea or vomiting. 04/22/21  Yes Kynsley Whitehouse, Druscilla Brownie, PA-C  alendronate (FOSAMAX) 70 MG tablet Take 1 tablet (70 mg total) by mouth every 7 (seven) days. Take with a full glass of water on an empty stomach. 10/30/20 10/30/21  Angelica Pou, MD  atorvastatin (LIPITOR) 40 MG tablet Take 1 tablet (40 mg total) by mouth daily. 04/17/21 04/17/22  Angelica Pou, MD  Azelastine-Fluticasone [phone] MCG/ACT SUSP One spray in each nostril twice a day, every day Patient taking differently: Place 1 spray into both nostrils as needed. 07/24/20   Angelica Pou, MD  cetirizine (ZYRTEC ALLERGY) 10 MG tablet Take 1 tablet (10 mg total) by mouth daily. Patient taking differently: Take 10 mg by mouth daily as needed for allergies. 09/18/20 09/18/21  Angelica Pou, MD  Cholecalciferol (VITAMIN D) 50 MCG (2000 UT) tablet Take 1 tablet (2,000 Units total) by mouth daily. 10/30/20   Angelica Pou, MD  diltiazem (CARDIZEM) 60 MG tablet TAKE 1 TABLET BY MOUTH EVERY 6 HOURS AS NEEDED Patient taking differently: No sig reported 11/05/20   Sherran Needs, NP  diltiazem (CARTIA XT) 120 MG 24 hr capsule Take 1 capsule (120 mg total) by mouth 2 (two) times daily. 01/26/21   Sherran Needs, NP  ezetimibe (ZETIA) 10 MG tablet Take 1 tablet (10 mg total) by mouth daily. 10/30/20 10/30/21  Angelica Pou, MD  fluticasone (FLOVENT HFA) 44 MCG/ACT inhaler Inhale 1 puff into the lungs daily. 09/18/20   Angelica Pou, MD  HYDROcodone-acetaminophen  (NORCO/VICODIN) 5-325 MG tablet Take 1/2 tab to 1 tab if needed for days when you are experiencing severe pain. 04/13/21   Angelica Pou, MD  levalbuterol [facility] HFA) 45 MCG/ACT inhaler INHALE 1 PUFF INTO THE LUNGS EVERY 6 HOURS AS NEEDED FOR SHORTNESS OF BREATH Patient taking differently: Inhale 1 puff into the lungs every 6 (six) hours as needed for shortness of breath. 09/14/20   Riesa Pope, MD  linaclotide (LINZESS) 72 MCG capsule TAKE 1 CAPSULE(145 MCG) BY MOUTH DAILY BEFORE BREAKFAST 07/25/20   Angelica Pou, MD  losartan (COZAAR) 50 MG tablet Take 2 tablets (100 mg total) by mouth daily. 09/18/20 04/16/21  Angelica Pou, MD  magnesium hydroxide (MILK OF MAGNESIA) 400 MG/5ML suspension Take 30 mLs by mouth daily as needed for moderate constipation. 04/17/21 04/17/22  Angelica Pou, MD  montelukast (  SINGULAIR) 10 MG tablet Take 1 tablet (10 mg total) by mouth at bedtime. 09/19/20   Angelica Pou, MD  pantoprazole (PROTONIX) 40 MG tablet Take 1 tablet (40 mg total) by mouth 2 (two) times daily before a meal. 04/16/21   Angelica Pou, MD  rivaroxaban (XARELTO) 20 MG TABS tablet Take 1 tablet (20 mg total) by mouth daily with supper. 10/13/20   Sherran Needs, NP  spironolactone (ALDACTONE) 25 MG tablet Take 1 tablet (25 mg total) by mouth daily. 04/17/21 04/17/22  Angelica Pou, MD  sucralfate (CARAFATE) 1 g tablet Take 1 tablet (1 g total) by mouth 4 (four) times daily -  with meals and at bedtime. 04/05/21   Hayden Rasmussen, MD  tiotropium (SPIRIVA) 18 MCG inhalation capsule Place 1 capsule (18 mcg total) into inhaler and inhale daily. 11/24/20   Angelica Pou, MD    Allergies    Albuterol, Percocet [oxycodone-acetaminophen], Celecoxib, Protonix [pantoprazole sodium], and Tramadol  Review of Systems   Review of Systems  Constitutional:  Negative for chills and fever.  Eyes:  Negative for visual disturbance.  Respiratory:  Negative  for shortness of breath.   Cardiovascular:  Positive for chest pain.  Gastrointestinal:  Positive for abdominal pain, diarrhea, nausea and vomiting.  Genitourinary:  Negative for dysuria and vaginal discharge.  Neurological:  Positive for dizziness. Negative for facial asymmetry, weakness and numbness.  All other systems reviewed and are negative.  Physical Exam Updated Vital Signs BP (!) 164/86   Pulse 68   Temp 98.8 F (37.1 C) (Oral)   Resp 18   SpO2 98%   Physical Exam Vitals and nursing note reviewed.  Constitutional:      General: She is not in acute distress.    Appearance: She is not ill-appearing.  HENT:     Head: Normocephalic.  Eyes:     Pupils: Pupils are equal, round, and reactive to light.  Cardiovascular:     Rate and Rhythm: Normal rate and regular rhythm.     Pulses: Normal pulses.     Heart sounds: Normal heart sounds. No murmur heard.   No friction rub. No gallop.  Pulmonary:     Effort: Pulmonary effort is normal.     Breath sounds: Normal breath sounds.  Abdominal:     General: Abdomen is flat. There is no distension.     Palpations: Abdomen is soft.     Tenderness: There is abdominal tenderness. There is guarding. There is no rebound.     Comments: Diffuse tenderness with guarding.  Musculoskeletal:        General: Normal range of motion.     Cervical back: Neck supple.  Skin:    General: Skin is warm and dry.  Neurological:     General: No focal deficit present.     Mental Status: She is alert.     Comments: Speech is clear, able to follow commands CN III-XII intact Normal strength in upper and lower extremities bilaterally including dorsiflexion and plantar flexion, strong and equal grip strength Sensation grossly intact throughout Moves extremities without ataxia, coordination intact No pronator drift Ambulates without difficulty  Psychiatric:        Mood and Affect: Mood normal.        Behavior: Behavior normal.    ED Results /  Procedures / Treatments   Labs (all labs ordered are listed, but only abnormal results are displayed) Labs Reviewed  BASIC METABOLIC PANEL - Abnormal; Notable  for the following components:      Result Value   Potassium 3.3 (*)    Glucose, Bld 123 (*)    BUN 6 (*)    All other components within normal limits  CBC - Abnormal; Notable for the following components:   RDW 15.9 (*)    All other components within normal limits  URINALYSIS, ROUTINE W REFLEX MICROSCOPIC - Abnormal; Notable for the following components:   Hgb urine dipstick SMALL (*)    Ketones, ur 5 (*)    All other components within normal limits  CBG MONITORING, ED - Abnormal; Notable for the following components:   Glucose-Capillary 109 (*)    All other components within normal limits  TROPONIN I (HIGH SENSITIVITY)  TROPONIN I (HIGH SENSITIVITY)    EKG None  Radiology DG Chest 1 View  Result Date: 04/21/2021 CLINICAL DATA:  Nausea, vomiting and dizziness. EXAM: CHEST  1 VIEW COMPARISON:  April 11, 2021 FINDINGS: Cardiomediastinal contours and hilar structures are stable. Lungs are clear.  No effusion on frontal view.  No pneumothorax. On limited assessment there is no acute skeletal process. IMPRESSION: No acute cardiopulmonary process. Electronically Signed   By: Zetta Bills M.D.   On: 04/21/2021 21:24   CT HEAD WO CONTRAST (5MM)  Result Date: 04/21/2021 CLINICAL DATA:  Dizziness EXAM: CT HEAD WITHOUT CONTRAST TECHNIQUE: Contiguous axial images were obtained from the base of the skull through the vertex without intravenous contrast. COMPARISON:  CT brain 06/15/2019 FINDINGS: Brain: No acute territorial infarction, hemorrhage or intracranial. Mild atrophy. Stable ventricle Vascular: No hyperdense vessels.  No unexpected calcification Skull: Normal. Negative for fracture or focal lesion. Sinuses/Orbits: No acute finding. Other: None IMPRESSION: No CT evidence for acute intracranial abnormality.  Atrophy Electronically  Signed   By: Donavan Foil M.D.   On: 04/21/2021 21:56   CT ABDOMEN PELVIS W CONTRAST  Result Date: 04/21/2021 CLINICAL DATA:  Acute nonlocalized abdominal pain in a 70 year old female. EXAM: CT ABDOMEN AND PELVIS WITH CONTRAST TECHNIQUE: Multidetector CT imaging of the abdomen and pelvis was performed using the standard protocol following bolus administration of intravenous contrast. CONTRAST:  63m OMNIPAQUE IOHEXOL 350 MG/ML SOLN COMPARISON:  February 03, 2021. FINDINGS: Lower chest: Lung bases are clear without effusion or consolidation. Hepatobiliary: Stable low-density hepatic lesions likely small cysts. Post cholecystectomy. Portal vein is patent. Pancreas: Stable appearance of ductal dilation of the pancreatic duct in the pancreatic head without signs of significant pancreatic atrophy unchanged dating back to May of 2021. Main pancreatic duct dilated up to 5 mm. No focal lesion or sign of inflammation. Spleen: Spleen normal size and contour. Adrenals/Urinary Tract: Adrenal glands are normal. Symmetric renal enhancement. No hydronephrosis. Smooth contour of the urinary bladder. No perivesical stranding. Stomach/Bowel: Question mild gastric thickening and mild thickening of the distal esophagus. No acute small bowel process. Appendix not visualized, no stranding adjacent to the cecum to suggest acute appendicitis. No pericolonic stranding or signs of bowel obstruction. Vascular/Lymphatic: Aortic atherosclerosis. Irregular plaque throughout the abdominal aorta without aneurysmal dilation measuring 2.4 cm greatest AP dimension and 2.4 cm greatest transverse dimension with some tortuosity. Abdominal vessels are patent with smooth contour the IVC. Circumaortic LEFT renal vein. There is no gastrohepatic or hepatoduodenal ligament lymphadenopathy. No retroperitoneal or mesenteric lymphadenopathy. No pelvic sidewall lymphadenopathy. Reproductive: No adnexal mass. Uterus remains in-situ, grossly unremarkable. Other:  No ascites.  No free air. Musculoskeletal: Spinal degenerative changes, mild. IMPRESSION: Question mild gastric thickening and mild thickening of the distal esophagus,  may represent esophagogastritis. Aortic atherosclerosis. Electronically Signed   By: Zetta Bills M.D.   On: 04/21/2021 22:15    Procedures Procedures   Medications Ordered in ED Medications  diltiazem (CARDIZEM CD) 24 hr capsule 120 mg (has no administration in time range)  rivaroxaban (XARELTO) tablet 20 mg (has no administration in time range)  ondansetron (ZOFRAN) injection 4 mg (4 mg Intravenous Given 04/21/21 2127)  sodium chloride 0.9 % bolus 1,000 mL (1,000 mLs Intravenous New Bag/Given 04/21/21 2128)  dicyclomine (BENTYL) injection 20 mg (20 mg Intramuscular Given 04/21/21 2129)  iohexol (OMNIPAQUE) 350 MG/ML injection 50 mL (50 mLs Intravenous Contrast Given 04/21/21 2157)  morphine 4 MG/ML injection 4 mg (4 mg Intravenous Given 04/21/21 2311)    ED Course  I have reviewed the triage vital signs and the nursing notes.  Pertinent labs & imaging results that were available during my care of the patient were reviewed by me and considered in my medical decision making (see chart for details).    MDM Rules/Calculators/A&P                           70 year old female presents to the ED due to dizziness, diarrhea, nausea, and vomiting.  Dizziness started earlier this morning.  She endorses 3 days of nonbloody diarrhea.  She also admits to diffuse abdominal pain.  No speech changes, visual changes, or unilateral weakness.  Upon arrival, stable vitals.  Patient in no acute distress.  Unfortunately patient waited over 11 hours prior to my initial evaluation.  Physical exam significant for diffuse abdominal tenderness with voluntary guarding.  Normal neurological exam.  Patient also admits to 1 episode of chest pain while in the waiting room.  Cardiac labs ordered.  Routine labs ordered in triage.  CT abdomen to rule out acute  abdomen given severe tenderness. CT head given unexplained dizziness.   CBC reassuring with no leukocytosis and normal hemoglobin.  UA significant for small hematuria.  No signs of infection.  BMP significant for hypokalemia 3.3.  Hyperglycemia 123.  No anion gap. CT abdomen personally reviewed which demonstrates: IMPRESSION:  Question mild gastric thickening and mild thickening of the distal  esophagus, may represent esophagogastritis.     Aortic atherosclerosis.   CT head negative for any acute abnormalities. CXR normal. Orthostatic vitals WNL. No evidence of orthostatic hypotension. Given unexplained dizziness, will obtain MRI to rule out CVA.  Patient handed off to Charlann Lange, PA-C at shift change pending MRI brain. If normal, patient may be discharged home with PCP follow-up.  Final Clinical Impression(s) / ED Diagnoses Final diagnoses:  Dizziness  Nausea    Rx / DC Orders ED Discharge Orders          Ordered    ondansetron (ZOFRAN ODT) 4 MG disintegrating tablet  Every 8 hours PRN        04/22/21 0017             Suzy Bouchard, PA-C 04/22/21 0021    Tegeler, Gwenyth Allegra, MD 04/22/21 1537

## 2021-04-22 ENCOUNTER — Telehealth: Payer: Self-pay

## 2021-04-22 DIAGNOSIS — R42 Dizziness and giddiness: Secondary | ICD-10-CM | POA: Diagnosis not present

## 2021-04-22 LAB — TROPONIN I (HIGH SENSITIVITY)
Troponin I (High Sensitivity): 8 ng/L (ref <18)
Troponin I (High Sensitivity): 9 ng/L (ref <18)

## 2021-04-22 MED ORDER — DILTIAZEM HCL ER COATED BEADS 120 MG PO CP24
120.0000 mg | ORAL_CAPSULE | Freq: Two times a day (BID) | ORAL | Status: DC
  Administered 2021-04-22: 120 mg via ORAL
  Filled 2021-04-22: qty 1

## 2021-04-22 MED ORDER — RIVAROXABAN 20 MG PO TABS: 20.0000 mg | ORAL_TABLET | Freq: Every day | ORAL | Status: DC

## 2021-04-22 MED ORDER — CETIRIZINE HCL 10 MG PO TABS: 10.0000 mg | ORAL_TABLET | Freq: Every day | ORAL | 0 refills | Status: DC | PRN

## 2021-04-22 MED ORDER — ONDANSETRON 4 MG PO TBDP: 4.0000 mg | ORAL_TABLET | Freq: Three times a day (TID) | ORAL | 0 refills | Status: DC | PRN

## 2021-04-22 NOTE — Discharge Instructions (Addendum)
It was a pleasure taking care of you today. As discussed, your Ct scan was reassuring. Your labs were unremarkable. Please follow-up with PCP within the next week for further evaluation.   Take your Xarelto in the morning as scheduled like was discussed.

## 2021-04-22 NOTE — Telephone Encounter (Signed)
Call from pt - stated everytime she takes Sucralfate and Pantoprazole she throws up. And she went to the ED  and her BP was high. Stated the ED gave her Zofran which helped with the nausea and requesting a rx; stated she went to the pharmacy this am, it was not there (Sent to [facility] on [address]). She wants some thing else for acid reflux also. She needs refill on Sertraline for her allergies; not on current med list. Send rxs to [facility] on [address]. I will remove other pharmacies from her chart per her request. Thanks

## 2021-04-22 NOTE — ED Provider Notes (Signed)
Dizziness, N, V, AP Leaning toward right this morning Pending MRI, troponin  Also has AP, - Neg CT  Delta troponin is negative.  MRI reviewed and does not show any acute findings.   On recheck, the patient reports she is feeling much better. She requests refill of her cetirizine which is provided. She can be discharged home per plan of previous treatment team.      Charlann Lange, PA-C 04/22/21 0229    Tegeler, Gwenyth Allegra, MD 04/22/21 1537

## 2021-04-22 NOTE — Telephone Encounter (Signed)
Called pt - no answer; left message to call the office. See ED note from yesterday.

## 2021-04-22 NOTE — ED Notes (Signed)
Patient verbalizes understanding of discharge instructions. Opportunity for questioning and answers were provided. Armband removed by staff, pt discharged from ED via wheelchair.  

## 2021-04-22 NOTE — Telephone Encounter (Signed)
Pt is requesting a call back she stated that her Dr started her on a acid reflux medication and when she took the medication she had to go to the ED because her BP went up to high

## 2021-04-23 ENCOUNTER — Other Ambulatory Visit: Payer: Self-pay | Admitting: Internal Medicine

## 2021-04-23 MED ORDER — OMEPRAZOLE 40 MG PO CPDR: 40.0000 mg | DELAYED_RELEASE_CAPSULE | Freq: Every day | ORAL | 1 refills | Status: DC

## 2021-04-23 MED ORDER — ONDANSETRON 4 MG PO TBDP: 4.0000 mg | ORAL_TABLET | Freq: Three times a day (TID) | ORAL | 0 refills | Status: DC | PRN

## 2021-04-23 MED ORDER — CETIRIZINE HCL 10 MG PO TABS: 10.0000 mg | ORAL_TABLET | Freq: Every day | ORAL | 0 refills | Status: DC | PRN

## 2021-04-23 NOTE — Telephone Encounter (Signed)
I have filled her Cetirizine (this is likely the Rx for allergies).  I have sent in a new Rx for Zofran.  I also changed her PPI to Omeprazole.  All sent to pharmacy requested.  I agree with Dr. Jimmye Norman that palpitations are unlikely to be caused by PPIs and omeprazole should be safe to trial.   She will need to follow up with Dr. Jimmye Norman and speak with her on the phone for other changes.   Gilles Chiquito, MD

## 2021-04-26 ENCOUNTER — Other Ambulatory Visit: Payer: Self-pay

## 2021-04-26 ENCOUNTER — Emergency Department (HOSPITAL_COMMUNITY)
Admission: EM | Admit: 2021-04-26 | Discharge: 2021-04-26 | Disposition: A | Discharge status: Home / Self Care | Payer: Medicare Other | Attending: Emergency Medicine | Admitting: Emergency Medicine

## 2021-04-26 DIAGNOSIS — I10 Essential (primary) hypertension: Secondary | ICD-10-CM | POA: Diagnosis not present

## 2021-04-26 DIAGNOSIS — J4520 Mild intermittent asthma, uncomplicated: Secondary | ICD-10-CM | POA: Insufficient documentation

## 2021-04-26 DIAGNOSIS — Z79899 Other long term (current) drug therapy: Secondary | ICD-10-CM | POA: Diagnosis not present

## 2021-04-26 DIAGNOSIS — I4891 Unspecified atrial fibrillation: Secondary | ICD-10-CM | POA: Insufficient documentation

## 2021-04-26 DIAGNOSIS — Z7901 Long term (current) use of anticoagulants: Secondary | ICD-10-CM | POA: Diagnosis not present

## 2021-04-26 DIAGNOSIS — Z7952 Long term (current) use of systemic steroids: Secondary | ICD-10-CM | POA: Diagnosis not present

## 2021-04-26 DIAGNOSIS — R519 Headache, unspecified: Secondary | ICD-10-CM | POA: Diagnosis not present

## 2021-04-26 DIAGNOSIS — Z87891 Personal history of nicotine dependence: Secondary | ICD-10-CM | POA: Insufficient documentation

## 2021-04-26 LAB — URINALYSIS, MICROSCOPIC (REFLEX): Bacteria, UA: NONE SEEN

## 2021-04-26 LAB — COMPREHENSIVE METABOLIC PANEL
ALT: 17 U/L (ref 0–44)
AST: 20 U/L (ref 15–41)
Albumin: 4 g/dL (ref 3.5–5.0)
Alkaline Phosphatase: 64 U/L (ref 38–126)
Anion gap: 8 (ref 5–15)
BUN: 5 mg/dL — ABNORMAL LOW (ref 8–23)
CO2: 29 mmol/L (ref 22–32)
Calcium: 9.7 mg/dL (ref 8.9–10.3)
Chloride: 103 mmol/L (ref 98–111)
Creatinine, Ser: 0.8 mg/dL (ref 0.44–1.00)
GFR, Estimated: >60 mL/min (ref >60)
Glucose, Bld: 89 mg/dL (ref 70–99)
Potassium: 3.2 mmol/L — ABNORMAL LOW (ref 3.5–5.1)
Sodium: 140 mmol/L (ref 135–145)
Total Bilirubin: 0.3 mg/dL (ref 0.3–1.2)
Total Protein: 7.3 g/dL (ref 6.5–8.1)

## 2021-04-26 LAB — URINALYSIS, ROUTINE W REFLEX MICROSCOPIC
Bilirubin Urine: NEGATIVE
Glucose, UA: NEGATIVE mg/dL
Ketones, ur: NEGATIVE mg/dL
Leukocytes,Ua: NEGATIVE
Nitrite: NEGATIVE
Protein, ur: NEGATIVE mg/dL
Specific Gravity, Urine: 1.01 (ref 1.005–1.030)
pH: 7.5 (ref 5.0–8.0)

## 2021-04-26 LAB — CBC WITH DIFFERENTIAL/PLATELET
Abs Immature Granulocytes: 0 10*3/uL (ref 0.00–0.07)
Basophils Absolute: 0 10*3/uL (ref 0.0–0.1)
Basophils Relative: 1 %
Eosinophils Absolute: 0.2 10*3/uL (ref 0.0–0.5)
Eosinophils Relative: 3 %
HCT: 40.4 % (ref 36.0–46.0)
Hemoglobin: 12.6 g/dL (ref 12.0–15.0)
Immature Granulocytes: 0 %
Lymphocytes Relative: 37 %
Lymphs Abs: 1.8 10*3/uL (ref 0.7–4.0)
MCH: 26.4 pg (ref 26.0–34.0)
MCHC: 31.2 g/dL (ref 30.0–36.0)
MCV: 84.5 fL (ref 80.0–100.0)
Monocytes Absolute: 0.5 10*3/uL (ref 0.1–1.0)
Monocytes Relative: 10 %
Neutro Abs: 2.3 10*3/uL (ref 1.7–7.7)
Neutrophils Relative %: 49 %
Platelets: 248 10*3/uL (ref 150–400)
RBC: 4.78 MIL/uL (ref 3.87–5.11)
RDW: 16 % — ABNORMAL HIGH (ref 11.5–15.5)
WBC: 4.7 10*3/uL (ref 4.0–10.5)
nRBC: 0 % (ref 0.0–0.2)

## 2021-04-26 MED ORDER — ACETAMINOPHEN 500 MG PO TABS
1000.0000 mg | ORAL_TABLET | Freq: Once | ORAL | Status: AC
  Administered 2021-04-26: 1000 mg via ORAL
  Filled 2021-04-26: qty 2

## 2021-04-26 MED ORDER — HYDRALAZINE HCL 25 MG PO TABS: 25.0000 mg | ORAL_TABLET | Freq: Once | ORAL | Status: DC

## 2021-04-26 MED ORDER — HYDRALAZINE HCL 25 MG PO TABS
50.0000 mg | ORAL_TABLET | Freq: Once | ORAL | Status: AC
  Administered 2021-04-26: 50 mg via ORAL
  Filled 2021-04-26: qty 2

## 2021-04-26 MED ORDER — POTASSIUM CHLORIDE CRYS ER 20 MEQ PO TBCR
40.0000 meq | EXTENDED_RELEASE_TABLET | Freq: Once | ORAL | Status: AC
  Administered 2021-04-26: 40 meq via ORAL
  Filled 2021-04-26: qty 2

## 2021-04-26 MED ORDER — IBUPROFEN 800 MG PO TABS
800.0000 mg | ORAL_TABLET | Freq: Once | ORAL | Status: DC
  Filled 2021-04-26: qty 1

## 2021-04-26 NOTE — ED Provider Notes (Signed)
Emergency Medicine Provider Triage Evaluation Note  [name] , a 70 y.o. female  was evaluated in triage.  Pt complains of hypertension and headache.  Patient reports that she took her blood pressure medication this morning approximately 0 700.  Patient reports that she developed a headache approximately 1300.  Headache gradual onset and progressively worse over time.  Patient denies any aggravating or alleviating factors.  Per chart review patient had noncontrasted CT and MRI brain recently which showed no acute abnormalities.  Review of Systems  Positive: Headache, hypertension Negative: Chest pain, shortness of breath, abdominal pain, numbness, weakness, facial asymmetry, aphasia, dysphagia, visual disturbance  Physical Exam  BP (!) 183/101 (BP Location: Right Arm)   Pulse 72   Temp 98.8 F (37.1 C) (Oral)   Resp 18   Ht '5\' 7"'$  (1.702 m)   Wt 65 kg   SpO2 97%   BMI 22.44 kg/m  Gen:   Awake, no distress   Resp:  Normal effort  MSK:   Moves extremities without difficulty  Other:  CN II through XII intact.  +5 Strength to bilateral upper and lower extremities.  Pronator drift negative.  Sensation to light touch intact to bilateral upper and lower extremities.  Medical Decision Making  Medically screening exam initiated at 4:36 PM.  Appropriate orders placed.  [name] was informed that the remainder of the evaluation will be completed by another provider, this initial triage assessment does not replace that evaluation, and the importance of remaining in the ED until their evaluation is complete.  The patient appears stable so that the remainder of the work up may be completed by another provider.      Loni Beckwith, PA-C 04/26/21 1638    Varney Biles, MD 04/30/21 0900

## 2021-04-26 NOTE — ED Provider Notes (Signed)
[facility] EMERGENCY DEPARTMENT Provider Note   CSN: [id number] Arrival date & time: 04/26/21  1602     History Chief Complaint  Patient presents with   Hypertension    [name] is a 70 y.o. female with PMHx HTN, atrial fibrillation, GERD, asthma who presents for evaluation of headache.  Patient states that she awoke this morning in her normal state of health.  She took all of her medications including her antihypertensives shortly after awakening.  Around noon today, patient states that she gradually developed a frontal headache of mild severity.  No associated numbness, weakness, syncope, vision changes, or gait changes.  No thunderclap onset or profound severity.  No photophobia or phonophobia.  Shortly after onset of her headache, she checked her blood pressure and noted that she was hypertensive.  She subsequently presented to our emergency department for further evaluation.     Past Medical History:  Diagnosis Date   Asthma in adult, mild intermittent, uncomplicated    Chronic anticoagulation    Chronic constipation    Eczema    GERD (gastroesophageal reflux disease)    History of kidney stones    Hx of vaginal bleeding, resolved, ultrasound completed 10/11/2018   Hypertension    Personal history of atrial fibrillation, s/p ablation, now in NSR but remains on anticoaulation 11/26/2015   Followed in cardiology clinic     Patient Active Problem List   Diagnosis Date Noted   H/O hypokalemia 10/29/2020   Abdominal aortic atherosclerosis ([place]) 10/22/2020   Seasonal and perennial allergic (presumed) rhinitis 07/25/2020   Asthma in adult, mild intermittent, uncomplicated    Chronic recurrent bilateral lower abdominal pain 07/24/2020   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 ([place]) 07/23/2020   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 02/14/2020   Primary osteoarthritis involving multiple joints 06/14/2018   Osteoporosis without current  pathological fracture 04/15/2017   Constipation, chronic 10/19/2016   Bilateral hearing loss due to cerumen impaction 09/10/2016   Hyperlipidemia 03/18/2016   GERD (gastroesophageal reflux disease)    Personal history of atrial fibrillation, now s/p ablation in NSR 11/26/2015   Marijuana abuse 06/11/2015   Hypertension     Past Surgical History:  Procedure Laterality Date   ATRIAL FIBRILLATION ABLATION N/A 09/30/2017   Procedure: ATRIAL FIBRILLATION ABLATION;  Surgeon: Thompson Grayer, MD;  Location: [facility];  Service: Cardiovascular;  Laterality: N/A;   CHOLECYSTECTOMY N/A 12/08/2018   Procedure: LAPAROSCOPIC CHOLECYSTECTOMY WITH INTRAOPERATIVE CHOLANGIOGRAM;  Surgeon: Jovita Kussmaul, MD;  Location: [facility];  Service: General;  Laterality: N/A;   ERCP N/A 12/09/2018   Procedure: ENDOSCOPIC RETROGRADE CHOLANGIOPANCREATOGRAPHY (ERCP);  Surgeon: Carol Ada, MD;  Location: [facility];  Service: Endoscopy;  Laterality: N/A;   SPHINCTEROTOMY  12/09/2018   Procedure: SPHINCTEROTOMY;  Surgeon: Carol Ada, MD;  Location: [facility];  Service: Endoscopy;;   TONSILLECTOMY     TUBAL LIGATION       OB History   No obstetric history on file.     Family History  Problem Relation Age of Onset   Heart attack Mother        Young age   Breast cancer Sister        Late 60s; now s/p mastectomy    Lung cancer Brother        x 2   Cancer Sister    Cancer Sister    Cancer Sister    Colon cancer Neg Hx    Colon polyps Neg Hx  Esophageal cancer Neg Hx    Rectal cancer Neg Hx    Stomach cancer Neg Hx     Social History   Tobacco Use   Smoking status: Former    Types: Cigarettes    Quit date: 10/24/2015    Years since quitting: 5.5   Smokeless tobacco: Never  Vaping Use   Vaping Use: Never used  Substance Use Topics   Alcohol use: Not Currently   Drug use: Not Currently    Types: Marijuana    Comment: Smoked marijuana in the past.    Home Medications Prior to  Admission medications   Medication Sig Start Date End Date Taking? Authorizing Provider  alendronate (FOSAMAX) 70 MG tablet Take 1 tablet (70 mg total) by mouth every 7 (seven) days. Take with a full glass of water on an empty stomach. 10/30/20 10/30/21  Angelica Pou, MD  atorvastatin (LIPITOR) 40 MG tablet Take 1 tablet (40 mg total) by mouth daily. 04/17/21 04/17/22  Angelica Pou, MD  Azelastine-Fluticasone [phone] MCG/ACT SUSP One spray in each nostril twice a day, every day Patient taking differently: Place 1 spray into both nostrils as needed. 07/24/20   Angelica Pou, MD  cetirizine (ZYRTEC ALLERGY) 10 MG tablet Take 1 tablet (10 mg total) by mouth daily as needed for allergies. 04/23/21 04/23/22  Sid Falcon, MD  Cholecalciferol (VITAMIN D) 50 MCG (2000 UT) tablet Take 1 tablet (2,000 Units total) by mouth daily. 10/30/20   Angelica Pou, MD  diltiazem (CARDIZEM) 60 MG tablet TAKE 1 TABLET BY MOUTH EVERY 6 HOURS AS NEEDED Patient taking differently: No sig reported 11/05/20   Sherran Needs, NP  diltiazem (CARTIA XT) 120 MG 24 hr capsule Take 1 capsule (120 mg total) by mouth 2 (two) times daily. 01/26/21   Sherran Needs, NP  ezetimibe (ZETIA) 10 MG tablet Take 1 tablet (10 mg total) by mouth daily. 10/30/20 10/30/21  Angelica Pou, MD  fluticasone (FLOVENT HFA) 44 MCG/ACT inhaler Inhale 1 puff into the lungs daily. 09/18/20   Angelica Pou, MD  HYDROcodone-acetaminophen (NORCO/VICODIN) 5-325 MG tablet Take 1/2 tab to 1 tab if needed for days when you are experiencing severe pain. 04/13/21   Angelica Pou, MD  levalbuterol [facility] HFA) 45 MCG/ACT inhaler INHALE 1 PUFF INTO THE LUNGS EVERY 6 HOURS AS NEEDED FOR SHORTNESS OF BREATH Patient taking differently: Inhale 1 puff into the lungs every 6 (six) hours as needed for shortness of breath. 09/14/20   Riesa Pope, MD  linaclotide (LINZESS) 72 MCG capsule TAKE 1 CAPSULE(145 MCG) BY MOUTH DAILY  BEFORE BREAKFAST 07/25/20   Angelica Pou, MD  losartan (COZAAR) 50 MG tablet Take 2 tablets (100 mg total) by mouth daily. 09/18/20 04/16/21  Angelica Pou, MD  magnesium hydroxide (MILK OF MAGNESIA) 400 MG/5ML suspension Take 30 mLs by mouth daily as needed for moderate constipation. 04/17/21 04/17/22  Angelica Pou, MD  montelukast (SINGULAIR) 10 MG tablet Take 1 tablet (10 mg total) by mouth at bedtime. 09/19/20   Angelica Pou, MD  omeprazole (PRILOSEC) 40 MG capsule TAKE 1 CAPSULE(40 MG) BY MOUTH DAILY 04/23/21   Sid Falcon, MD  ondansetron (ZOFRAN ODT) 4 MG disintegrating tablet Take 1 tablet (4 mg total) by mouth every 8 (eight) hours as needed for nausea or vomiting. 04/23/21   Sid Falcon, MD  rivaroxaban (XARELTO) 20 MG TABS tablet Take 1 tablet (20 mg total) by mouth daily with  supper. 10/13/20   Sherran Needs, NP  spironolactone (ALDACTONE) 25 MG tablet Take 1 tablet (25 mg total) by mouth daily. 04/17/21 04/17/22  Angelica Pou, MD  sucralfate (CARAFATE) 1 g tablet Take 1 tablet (1 g total) by mouth 4 (four) times daily -  with meals and at bedtime. 04/05/21   Hayden Rasmussen, MD  tiotropium (SPIRIVA) 18 MCG inhalation capsule Place 1 capsule (18 mcg total) into inhaler and inhale daily. 11/24/20   Angelica Pou, MD    Allergies    Albuterol, Percocet [oxycodone-acetaminophen], Celecoxib, Protonix [pantoprazole sodium], and Tramadol  Review of Systems   Review of Systems  Constitutional:  Negative for chills and fever.  HENT:  Negative for ear pain and sore throat.   Eyes:  Negative for pain and visual disturbance.  Respiratory:  Negative for cough and shortness of breath.   Cardiovascular:  Negative for chest pain and palpitations.  Gastrointestinal:  Negative for abdominal pain and vomiting.  Genitourinary:  Negative for dysuria and hematuria.  Musculoskeletal:  Negative for arthralgias and back pain.  Skin:  Negative for color change  and rash.  Neurological:  Positive for headaches. Negative for seizures and syncope.  All other systems reviewed and are negative.  Physical Exam Updated Vital Signs BP (!) 158/95   Pulse 79   Temp 98.8 F (37.1 C) (Oral)   Resp 17   Ht '5\' 7"'$  (1.702 m)   Wt 65 kg   SpO2 96%   BMI 22.44 kg/m   Physical Exam Vitals and nursing note reviewed.  Constitutional:      General: She is not in acute distress.    Appearance: She is well-developed.  HENT:     Head: Normocephalic and atraumatic.  Eyes:     Conjunctiva/sclera: Conjunctivae normal.  Cardiovascular:     Rate and Rhythm: Normal rate and regular rhythm.     Heart sounds: No murmur heard. Pulmonary:     Effort: Pulmonary effort is normal. No respiratory distress.     Breath sounds: Normal breath sounds.  Abdominal:     Palpations: Abdomen is soft.     Tenderness: There is no abdominal tenderness.  Musculoskeletal:     Cervical back: Neck supple.  Skin:    General: Skin is warm and dry.  Neurological:     General: No focal deficit present.     Mental Status: She is alert and oriented to person, place, and time. Mental status is at baseline.     Cranial Nerves: No cranial nerve deficit.     Sensory: No sensory deficit.     Motor: No weakness.     Coordination: Coordination normal.     Gait: Gait normal.     Comments: No focal neurologic deficits.    ED Results / Procedures / Treatments   Labs (all labs ordered are listed, but only abnormal results are displayed) Labs Reviewed  CBC WITH DIFFERENTIAL/PLATELET - Abnormal; Notable for the following components:      Result Value   RDW 16.0 (*)    All other components within normal limits  COMPREHENSIVE METABOLIC PANEL - Abnormal; Notable for the following components:   Potassium 3.2 (*)    BUN 5 (*)    All other components within normal limits  URINALYSIS, ROUTINE W REFLEX MICROSCOPIC - Abnormal; Notable for the following components:   Hgb urine dipstick TRACE  (*)    All other components within normal limits  URINALYSIS, MICROSCOPIC (REFLEX)  EKG None  Radiology No results found.  Procedures Procedures   Medications Ordered in ED Medications  acetaminophen (TYLENOL) tablet 1,000 mg (1,000 mg Oral Given 04/26/21 2008)  potassium chloride SA (KLOR-CON) CR tablet 40 mEq (40 mEq Oral Given 04/26/21 2008)  hydrALAZINE (APRESOLINE) tablet 50 mg (50 mg Oral Given 04/26/21 2008)    ED Course  I have reviewed the triage vital signs and the nursing notes.  Pertinent labs & imaging results that were available during my care of the patient were reviewed by me and considered in my medical decision making (see chart for details).    MDM Rules/Calculators/A&P                           70 y.o. female with past medical history as above who presents for evaluation of headache.  Afebrile and hemodynamically stable.  Vitals notable for hypertension.  Exam with no focal neurologic deficits.  I have low suspicion for intracranial abnormality such as CVA, aneurysm, mass, or intracranial bleed.  I do not suspect vertebral artery or carotid artery dissection.  No recent fever, meningismus, or infectious symptoms to suggest meningitis or encephalitis.  No history or physical exam findings to suggest dental or ear etiology.  No tenderness over temporal artery so doubt temporal arteritis.  No vision changes to success ophthalmologic ideology.  No hyperesthesia or rash concerning for zoster. Patient was given a Tylenol for headache, as well as single dose of hydralazine with improvement of hypertension.  Patient is suitable for discharge.  She will follow-up in the outpatient setting.       Final Clinical Impression(s) / ED Diagnoses Final diagnoses:  Hypertension, unspecified type  Nonintractable headache, unspecified chronicity pattern, unspecified headache type  Primary hypertension  HTN (hypertension), benign    Rx / DC Orders ED Discharge Orders     None         Violet Baldy, MD 04/27/21 1321    Sherwood Gambler, MD 04/28/21 0120

## 2021-04-26 NOTE — ED Triage Notes (Signed)
Pt here d/t inability to keep blood pressure down, Pt states she is compliant with medications. Endorses headache. 1010 pain. No other neuro deficits.

## 2021-04-26 NOTE — Discharge Instructions (Signed)
Please follow-up with your PCP closely for further management of your high blood pressure.  They may consider adjusting your blood pressure medications.  You may take Metamucil or fiber supplements as needed.

## 2021-04-28 ENCOUNTER — Telehealth: Payer: Self-pay | Admitting: *Deleted

## 2021-04-28 NOTE — Telephone Encounter (Signed)
Patient called in with questions about meds:  Spironolactone. Explained this is a diuretic but will also help lower BP. She is advised to take in AM Atorvastatin. Explained this is to help lower cholesterol and should be taken with dinner.  Also c/o congestion (nasal and chest). States she feels better with mucinex. Advised to continue mucinex and drink plenty of fluids, especially water, to thin secretions. She will call back for an appt with Resident if sx worsen or do not improve.  3 month f/u scheduled with PCP for 07/30/21 at 0915.

## 2021-05-13 ENCOUNTER — Other Ambulatory Visit: Payer: Self-pay

## 2021-05-13 ENCOUNTER — Telehealth: Payer: Self-pay

## 2021-05-13 MED ORDER — HYDROCODONE-ACETAMINOPHEN 5-325 MG PO TABS: ORAL_TABLET | ORAL | 0 refills | Status: DC

## 2021-05-13 NOTE — Telephone Encounter (Signed)
error 

## 2021-05-20 ENCOUNTER — Telehealth: Payer: Self-pay

## 2021-05-20 NOTE — Telephone Encounter (Signed)
Pt is requesting a call back ... pt is wanting to know what her medication are for and is she to be taking all of them she is confused on which ones are up to date

## 2021-05-29 ENCOUNTER — Other Ambulatory Visit: Payer: Self-pay

## 2021-05-29 MED ORDER — TIOTROPIUM BROMIDE MONOHYDRATE 18 MCG IN CAPS: 18.0000 ug | ORAL_CAPSULE | Freq: Every day | RESPIRATORY_TRACT | 3 refills | Status: DC

## 2021-06-01 ENCOUNTER — Encounter: Payer: Medicare Other | Admitting: Pharmacist

## 2021-06-02 ENCOUNTER — Ambulatory Visit (INDEPENDENT_AMBULATORY_CARE_PROVIDER_SITE_OTHER): Payer: Medicare Other | Admitting: Pharmacist

## 2021-06-02 DIAGNOSIS — Z79899 Other long term (current) drug therapy: Secondary | ICD-10-CM

## 2021-06-02 NOTE — Patient Instructions (Signed)
Miss Rembert it was a pleasure seeing you today.   Today we reviewed all of the medications you are currently taking. Included is an updated medication list. Please continue taking all medications as prescribed on this list.  Continue to use your pill organizer at home to help you remember if you took your medications  If you become interested in having your medications packaged by a pharmacy please let us know.  Omeprazole is at your pharmacy. This is replacing pantoprazole so when you pick this up STOP taking pantoprazole   If you have any questions please call the clinic and ask to speak with me.  Follow-up with Dr. Jimmye Norman as needed

## 2021-06-02 NOTE — Progress Notes (Signed)
   Subjective:    Patient ID: DAN DISSINGER, female    DOB: [birth year], 70 y.o.   MRN: [id number]  HPI  Patient is a 70 y.o. female who presents for medication review and management. She is in good spirits. Patient was referred on 05/20/21 and last seen by Primary Care Provider on 04/16/21.  Patient has history of confusion surrounding medications and gets confused due to discussions with the managed medication team patient also speaks with from time to time.   Medication Adherence Questionnaire (A score of 2 or more points indicates risk for nonadherence)  Do you know what each of your medicines is for? 1 (1 point if no)  Do you ever have trouble remembering to take your medicine? 0 (2 points if yes)  Do you ever not take a medicine because you feel you do not need it?  1 (1 point if yes)  Do you think that any of your medicines is not helping you? 0 (1 point if yes)  Do you have any physical problems such as vision loss that keep you from taking your medicines as prescribed?  0 (2 points if yes)  Do you think any of your medicine is causing a side effect? 1; "I feel different with the new blood pressure medication but I can't say what it is" (1 point if yes)  Do you know the names of ALL of your medicines? 1 (1 point if no)  Do you think that you need ALL of your medicines? 0 (1 point if no)  In the past 6 months, have you missed getting a refill or a new prescription filled on time?  1 (1 point if yes)  How often do you miss taking a dose of medicine?  2 Never (0 points), 1 or 2 times a month (1 points), 1 time a week (2 points), 2 or more times a week (3 points).   TOTAL SCORE 7/14   Patient still taking pantoprazole despite reporting that this caused her nausea/vomiting and was switched to omeprazole. Patient was not aware omeprazole prescription was sent to pharmacy. When discussing this with patient she stated "oh yes that medication made me feel horrible [pantoprazole]". Patient out of  alendronate and will pick up alongside the omeprazole.  Patient states that her newer medications make her "feel different" but she cannot pinpoint what exactly this means and is unable to elaborate.  Objective:   Labs:   Physical Exam Neurological:     Mental Status: She is alert and oriented to person, place, and time.   Assessment/Plan:   Understanding of regimen: fair  Understanding of indications: fair  Potential of compliance: fair  Patient has known adherence challenges based on score of 7 for questionnaire. Barriers include: lack of knowledge, forgetfulness, and concern for more harm than good. Medication list reviewed and updated. Patient was provided with a printed medication list.   Follow-up appointment with PCP as needed. Written patient instructions provided.  This appointment required 30 minutes of direct patient care.  Thank you for involving pharmacy to assist in providing this patient's care.

## 2021-06-09 ENCOUNTER — Other Ambulatory Visit: Payer: Self-pay

## 2021-06-09 ENCOUNTER — Other Ambulatory Visit (HOSPITAL_COMMUNITY): Payer: Self-pay | Admitting: *Deleted

## 2021-06-09 DIAGNOSIS — K5909 Other constipation: Secondary | ICD-10-CM

## 2021-06-09 MED ORDER — LINACLOTIDE 72 MCG PO CAPS: ORAL_CAPSULE | ORAL | 5 refills | Status: DC

## 2021-06-09 MED ORDER — DILTIAZEM HCL ER COATED BEADS 120 MG PO CP24: 120.0000 mg | ORAL_CAPSULE | Freq: Two times a day (BID) | ORAL | 2 refills | Status: DC

## 2021-06-09 MED ORDER — ALENDRONATE SODIUM 70 MG PO TABS: 70.0000 mg | ORAL_TABLET | ORAL | 3 refills | Status: DC

## 2021-06-15 ENCOUNTER — Emergency Department (HOSPITAL_COMMUNITY)
Admission: EM | Admit: 2021-06-15 | Discharge: 2021-06-15 | Disposition: A | Discharge status: Home / Self Care | Payer: Medicare Other | Attending: Emergency Medicine | Admitting: Emergency Medicine

## 2021-06-15 ENCOUNTER — Emergency Department (HOSPITAL_COMMUNITY): Payer: Medicare Other

## 2021-06-15 DIAGNOSIS — Z7951 Long term (current) use of inhaled steroids: Secondary | ICD-10-CM | POA: Insufficient documentation

## 2021-06-15 DIAGNOSIS — Z87891 Personal history of nicotine dependence: Secondary | ICD-10-CM | POA: Diagnosis not present

## 2021-06-15 DIAGNOSIS — I4891 Unspecified atrial fibrillation: Secondary | ICD-10-CM | POA: Insufficient documentation

## 2021-06-15 DIAGNOSIS — J4520 Mild intermittent asthma, uncomplicated: Secondary | ICD-10-CM | POA: Diagnosis not present

## 2021-06-15 DIAGNOSIS — J069 Acute upper respiratory infection, unspecified: Secondary | ICD-10-CM | POA: Insufficient documentation

## 2021-06-15 DIAGNOSIS — R110 Nausea: Secondary | ICD-10-CM | POA: Insufficient documentation

## 2021-06-15 DIAGNOSIS — Z79899 Other long term (current) drug therapy: Secondary | ICD-10-CM | POA: Insufficient documentation

## 2021-06-15 DIAGNOSIS — I10 Essential (primary) hypertension: Secondary | ICD-10-CM | POA: Insufficient documentation

## 2021-06-15 DIAGNOSIS — B9789 Other viral agents as the cause of diseases classified elsewhere: Secondary | ICD-10-CM | POA: Diagnosis not present

## 2021-06-15 DIAGNOSIS — Z7901 Long term (current) use of anticoagulants: Secondary | ICD-10-CM | POA: Insufficient documentation

## 2021-06-15 DIAGNOSIS — Z20822 Contact with and (suspected) exposure to covid-19: Secondary | ICD-10-CM | POA: Diagnosis not present

## 2021-06-15 DIAGNOSIS — R059 Cough, unspecified: Secondary | ICD-10-CM | POA: Diagnosis not present

## 2021-06-15 DIAGNOSIS — R0789 Other chest pain: Secondary | ICD-10-CM | POA: Diagnosis not present

## 2021-06-15 DIAGNOSIS — R079 Chest pain, unspecified: Secondary | ICD-10-CM | POA: Diagnosis not present

## 2021-06-15 LAB — CBC WITH DIFFERENTIAL/PLATELET
Abs Immature Granulocytes: 0.01 10*3/uL (ref 0.00–0.07)
Basophils Absolute: 0 10*3/uL (ref 0.0–0.1)
Basophils Relative: 1 %
Eosinophils Absolute: 0.1 10*3/uL (ref 0.0–0.5)
Eosinophils Relative: 2 %
HCT: 37 % (ref 36.0–46.0)
Hemoglobin: 11.5 g/dL — ABNORMAL LOW (ref 12.0–15.0)
Immature Granulocytes: 0 %
Lymphocytes Relative: 37 %
Lymphs Abs: 1.6 10*3/uL (ref 0.7–4.0)
MCH: 26.5 pg (ref 26.0–34.0)
MCHC: 31.1 g/dL (ref 30.0–36.0)
MCV: 85.3 fL (ref 80.0–100.0)
Monocytes Absolute: 0.4 10*3/uL (ref 0.1–1.0)
Monocytes Relative: 8 %
Neutro Abs: 2.3 10*3/uL (ref 1.7–7.7)
Neutrophils Relative %: 52 %
Platelets: 277 10*3/uL (ref 150–400)
RBC: 4.34 MIL/uL (ref 3.87–5.11)
RDW: 16.5 % — ABNORMAL HIGH (ref 11.5–15.5)
WBC: 4.3 10*3/uL (ref 4.0–10.5)
nRBC: 0 % (ref 0.0–0.2)

## 2021-06-15 LAB — TROPONIN I (HIGH SENSITIVITY)
Troponin I (High Sensitivity): 6 ng/L (ref <18)
Troponin I (High Sensitivity): 5 ng/L (ref <18)

## 2021-06-15 LAB — RESP PANEL BY RT-PCR (FLU A&B, COVID) ARPGX2
Influenza A by PCR: NEGATIVE
Influenza B by PCR: NEGATIVE
SARS Coronavirus 2 by RT PCR: NEGATIVE

## 2021-06-15 LAB — URINALYSIS, ROUTINE W REFLEX MICROSCOPIC
Bacteria, UA: NONE SEEN
Bilirubin Urine: NEGATIVE
Glucose, UA: NEGATIVE mg/dL
Ketones, ur: NEGATIVE mg/dL
Leukocytes,Ua: NEGATIVE
Nitrite: NEGATIVE
Protein, ur: NEGATIVE mg/dL
Specific Gravity, Urine: 1.003 — ABNORMAL LOW (ref 1.005–1.030)
pH: 6 (ref 5.0–8.0)

## 2021-06-15 LAB — COMPREHENSIVE METABOLIC PANEL
ALT: 19 U/L (ref 0–44)
AST: 23 U/L (ref 15–41)
Albumin: 3.9 g/dL (ref 3.5–5.0)
Alkaline Phosphatase: 77 U/L (ref 38–126)
Anion gap: 9 (ref 5–15)
BUN: 6 mg/dL — ABNORMAL LOW (ref 8–23)
CO2: 26 mmol/L (ref 22–32)
Calcium: 9.7 mg/dL (ref 8.9–10.3)
Chloride: 102 mmol/L (ref 98–111)
Creatinine, Ser: 0.8 mg/dL (ref 0.44–1.00)
GFR, Estimated: >60 mL/min (ref >60)
Glucose, Bld: 92 mg/dL (ref 70–99)
Potassium: 3.6 mmol/L (ref 3.5–5.1)
Sodium: 137 mmol/L (ref 135–145)
Total Bilirubin: 0.5 mg/dL (ref 0.3–1.2)
Total Protein: 7.4 g/dL (ref 6.5–8.1)

## 2021-06-15 MED ORDER — DOCUSATE SODIUM 100 MG PO CAPS: 100.0000 mg | ORAL_CAPSULE | Freq: Every day | ORAL | 0 refills | Status: DC

## 2021-06-15 NOTE — ED Provider Notes (Signed)
Emergency Medicine Provider Triage Evaluation Note  MALEEYAH MCCAUGHEY , a 70 y.o. female  was evaluated in triage.  Pt complains of chest pain and weakness for the past 3 to 4 days.    Review of Systems  Positive: Chest pain, weakness, diarrhea, nausea and vomiting Negative:   Physical Exam  BP 109/70 (BP Location: Left Arm)   Pulse 69   Temp 98.1 F (36.7 C) (Oral)   Resp 13   SpO2 99%  Gen:   Awake, no distress   Resp:  Normal effort  MSK:   Moves extremities without difficulty  Other:    Medical Decision Making  Medically screening exam initiated at 10:28 AM.  Appropriate orders placed.  [name] was informed that the remainder of the evaluation will be completed by another provider, this initial triage assessment does not replace that evaluation, and the importance of remaining in the ED until their evaluation is complete.     Darliss Ridgel 06/15/21 1029    Daleen Bo, MD 06/15/21 [id number]

## 2021-06-15 NOTE — Discharge Instructions (Addendum)
At this time there does not appear to be the presence of an emergent medical condition, however there is always the potential for conditions to change. Please read and follow the below instructions.  Please return to the Emergency Department immediately for any new or worsening symptoms. Please be sure to follow up with your Primary Care Provider within one week regarding your visit today; please call their office to schedule an appointment even if you are feeling better for a follow-up visit. Please read plenty water and get plenty of rest to help with her symptoms.  You may use the medication Colace as prescribed to help with constipation.    Go to the nearest Emergency Department immediately if: You have fever or chills You have shortness of breath You have chest pain that occurs whenever you are not coughing You have very bad or constant: Headache. Ear pain. Pain in your forehead, behind your eyes, and over your cheekbones (sinus pain). Chest pain. You have long-lasting (chronic) lung disease along with any of these: Wheezing. Long-lasting cough. Coughing up blood. A change in your usual mucus. You have a stiff neck. You have changes in your: Vision. Hearing. Thinking. Mood. You have any new/concerning or worsening of symptoms.    Please read the additional information packets attached to your discharge summary.  Do not take your medicine if  develop an itchy rash, swelling in your mouth or lips, or difficulty breathing; call 911 and seek immediate emergency medical attention if this occurs.  You may review your lab tests and imaging results in their entirety on your MyChart account.  Please discuss all results of fully with your primary care provider and other specialist at your follow-up visit.  Note: Portions of this text may have been transcribed using voice recognition software. Every effort was made to ensure accuracy; however, inadvertent computerized transcription  errors may still be present.

## 2021-06-15 NOTE — ED Provider Notes (Signed)
[facility] Provider Note   CSN: [id number] Arrival date & time: 06/15/21  2119     History Chief Complaint  Patient presents with   Chest Pain   Weakness   Nausea    [name] is a 70 y.o. female history includes asthma, GERD, hypertension, hyperlipidemia, atrial fibrillation on Xarelto.  Patient reports that over the past 3-4 days she has been experiencing generalized weakness and nonproductive cough, over the past 2 days she reports she has developed a mild central chest burning sensation whenever she coughs this improves when she does not cough, pain does not radiate.  Patient reports that she is also been experiencing some nausea as well for the past few days but has not vomited.  Patient reports generalized weakness as a tired sensation where she feels that she is drained of energy, she denies any unilateral weakness.  Patient reports that she has felt cold at home and has not measured a fever.  Denies fall, injury, fever, headache/vision changes, vomiting, dysuria/hematuria, abdominal pain, extremity swelling/color change or any additional concerns.  HPI     Past Medical History:  Diagnosis Date   Asthma in adult, mild intermittent, uncomplicated    Chronic anticoagulation    Chronic constipation    Eczema    GERD (gastroesophageal reflux disease)    History of kidney stones    Hx of vaginal bleeding, resolved, ultrasound completed 10/11/2018   Hypertension    Personal history of atrial fibrillation, s/p ablation, now in NSR but remains on anticoaulation 11/26/2015   Followed in cardiology clinic     Patient Active Problem List   Diagnosis Date Noted   H/O hypokalemia 10/29/2020   Abdominal aortic atherosclerosis ([place]) 10/22/2020   Seasonal and perennial allergic (presumed) rhinitis 07/25/2020   Asthma in adult, mild intermittent, uncomplicated    Chronic recurrent bilateral lower abdominal pain 07/24/2020    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 ([place]) 07/23/2020   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 02/14/2020   Primary osteoarthritis involving multiple joints 06/14/2018   Osteoporosis without current pathological fracture 04/15/2017   Constipation, chronic 10/19/2016   Bilateral hearing loss due to cerumen impaction 09/10/2016   Hyperlipidemia 03/18/2016   GERD (gastroesophageal reflux disease)    Personal history of atrial fibrillation, now s/p ablation in NSR 11/26/2015   Marijuana abuse 06/11/2015   Hypertension     Past Surgical History:  Procedure Laterality Date   ATRIAL FIBRILLATION ABLATION N/A 09/30/2017   Procedure: ATRIAL FIBRILLATION ABLATION;  Surgeon: Thompson Grayer, MD;  Location: [facility];  Service: Cardiovascular;  Laterality: N/A;   CHOLECYSTECTOMY N/A 12/08/2018   Procedure: LAPAROSCOPIC CHOLECYSTECTOMY WITH INTRAOPERATIVE CHOLANGIOGRAM;  Surgeon: Jovita Kussmaul, MD;  Location: [facility];  Service: General;  Laterality: N/A;   ERCP N/A 12/09/2018   Procedure: ENDOSCOPIC RETROGRADE CHOLANGIOPANCREATOGRAPHY (ERCP);  Surgeon: Carol Ada, MD;  Location: [facility];  Service: Endoscopy;  Laterality: N/A;   SPHINCTEROTOMY  12/09/2018   Procedure: SPHINCTEROTOMY;  Surgeon: Carol Ada, MD;  Location: [facility] ENDOS[facility];  Service: Endoscopy;;   TONSILLECTOMY     TUBAL LIGATION       OB History   No obstetric history on file.     Family History  Problem Relation Age of Onset   Heart attack Mother        Young age   Breast cancer Sister        Late 52s; now s/p mastectomy    Lung cancer Brother  x 2   Cancer Sister    Cancer Sister    Cancer Sister    Colon cancer Neg Hx    Colon polyps Neg Hx    Esophageal cancer Neg Hx    Rectal cancer Neg Hx    Stomach cancer Neg Hx     Social History   Tobacco Use   Smoking status: Former    Types: Cigarettes    Quit date: 10/24/2015    Years since quitting: 5.6   Smokeless tobacco: Never   Vaping Use   Vaping Use: Never used  Substance Use Topics   Alcohol use: Not Currently   Drug use: Not Currently    Types: Marijuana    Comment: Smoked marijuana in the past.    Home Medications Prior to Admission medications   Medication Sig Start Date End Date Taking? Authorizing Provider  docusate sodium (COLACE) 100 MG capsule Take 1 capsule (100 mg total) by mouth daily. 06/15/21  Yes Nuala Alpha A, PA-C  alendronate (FOSAMAX) 70 MG tablet Take 1 tablet (70 mg total) by mouth every 7 (seven) days. Take with a full glass of water on an empty stomach. 06/09/21 06/09/22  Angelica Pou, MD  atorvastatin (LIPITOR) 40 MG tablet Take 1 tablet (40 mg total) by mouth daily. 04/17/21 04/17/22  Angelica Pou, MD  Azelastine-Fluticasone [phone] MCG/ACT SUSP One spray in each nostril twice a day, every day Patient taking differently: Place 1 spray into both nostrils as needed. 07/24/20   Angelica Pou, MD  cetirizine (ZYRTEC ALLERGY) 10 MG tablet Take 1 tablet (10 mg total) by mouth daily as needed for allergies. 04/23/21 04/23/22  Sid Falcon, MD  Cholecalciferol (VITAMIN D) 50 MCG (2000 UT) tablet Take 1 tablet (2,000 Units total) by mouth daily. 10/30/20   Angelica Pou, MD  diltiazem (CARDIZEM) 60 MG tablet TAKE 1 TABLET BY MOUTH EVERY 6 HOURS AS NEEDED Patient taking differently: Take 60 mg by mouth daily as needed (flutters). 11/05/20   Sherran Needs, NP  diltiazem (CARTIA XT) 120 MG 24 hr capsule Take 1 capsule (120 mg total) by mouth 2 (two) times daily. 06/09/21   Sherran Needs, NP  ezetimibe (ZETIA) 10 MG tablet Take 1 tablet (10 mg total) by mouth daily. 10/30/20 10/30/21  Angelica Pou, MD  fluticasone (FLOVENT HFA) 44 MCG/ACT inhaler Inhale 1 puff into the lungs daily. 09/18/20   Angelica Pou, MD  guaiFENesin (MUCUS RELIEF ADULT PO) Take by mouth.    [provider]  HYDROcodone-acetaminophen (NORCO/VICODIN) 5-325 MG tablet Take  1/2 tab to 1 tab if needed for days when you are experiencing severe pain. Patient not taking: Reported on 06/02/2021 05/13/21   Angelica Pou, MD  levalbuterol [facility] HFA) 45 MCG/ACT inhaler INHALE 1 PUFF INTO THE LUNGS EVERY 6 HOURS AS NEEDED FOR SHORTNESS OF BREATH Patient taking differently: Inhale 1 puff into the lungs every 6 (six) hours as needed for shortness of breath. 09/14/20   Riesa Pope, MD  linaclotide (LINZESS) 72 MCG capsule TAKE 1 CAPSULE(145 MCG) BY MOUTH DAILY BEFORE BREAKFAST 06/09/21   Angelica Pou, MD  losartan (COZAAR) 50 MG tablet Take 2 tablets (100 mg total) by mouth daily. 09/18/20 06/02/21  Angelica Pou, MD  magnesium hydroxide (MILK OF MAGNESIA) 400 MG/5ML suspension Take 30 mLs by mouth daily as needed for moderate constipation. Patient not taking: Reported on 06/02/2021 04/17/21 04/17/22  Angelica Pou, MD  montelukast ([place]) 10  MG tablet Take 1 tablet (10 mg total) by mouth at bedtime. 09/19/20   Angelica Pou, MD  omeprazole (PRILOSEC) 40 MG capsule TAKE 1 CAPSULE(40 MG) BY MOUTH DAILY Patient not taking: Reported on 06/02/2021 04/23/21   Sid Falcon, MD  ondansetron (ZOFRAN ODT) 4 MG disintegrating tablet Take 1 tablet (4 mg total) by mouth every 8 (eight) hours as needed for nausea or vomiting. Patient not taking: Reported on 06/02/2021 04/23/21   Sid Falcon, MD  rivaroxaban (XARELTO) 20 MG TABS tablet Take 1 tablet (20 mg total) by mouth daily with supper. 10/13/20   Sherran Needs, NP  spironolactone (ALDACTONE) 25 MG tablet Take 1 tablet (25 mg total) by mouth daily. 04/17/21 04/17/22  Angelica Pou, MD  sucralfate (CARAFATE) 1 g tablet Take 1 tablet (1 g total) by mouth 4 (four) times daily -  with meals and at bedtime. 04/05/21   Hayden Rasmussen, MD  tiotropium (SPIRIVA) 18 MCG inhalation capsule Place 1 capsule (18 mcg total) into inhaler and inhale daily. 05/29/21   Angelica Pou, MD     Allergies    Albuterol, Percocet [oxycodone-acetaminophen], Celecoxib, Protonix [pantoprazole sodium], and Tramadol  Review of Systems   Review of Systems Ten systems are reviewed and are negative for acute change except as noted in the HPI  Physical Exam Updated Vital Signs BP 135/77   Pulse 70   Temp 98.1 F (36.7 C) (Oral)   Resp 19   SpO2 100%   Physical Exam Constitutional:      General: She is not in acute distress.    Appearance: Normal appearance. She is well-developed. She is not ill-appearing or diaphoretic.  HENT:     Head: Normocephalic and atraumatic.  Eyes:     General: Vision grossly intact. Gaze aligned appropriately.     Pupils: Pupils are equal, round, and reactive to light.  Neck:     Trachea: Trachea and phonation normal.  Cardiovascular:     Rate and Rhythm: Normal rate and regular rhythm.  Pulmonary:     Effort: Pulmonary effort is normal. No respiratory distress.     Breath sounds: Normal breath sounds.  Chest:     Chest wall: Tenderness present. No deformity.  Abdominal:     General: There is no distension.     Palpations: Abdomen is soft.     Tenderness: There is no abdominal tenderness. There is no guarding or rebound.  Musculoskeletal:        General: Normal range of motion.     Cervical back: Normal range of motion.     Right lower leg: No tenderness. No edema.     Left lower leg: No tenderness. No edema.  Skin:    General: Skin is warm and dry.  Neurological:     Mental Status: She is alert.     GCS: GCS eye subscore is 4. GCS verbal subscore is 5. GCS motor subscore is 6.     Comments: Speech is clear and goal oriented, follows commands Major Cranial nerves without deficit, no facial droop Moves extremities without ataxia, coordination intact  Psychiatric:        Behavior: Behavior normal.    ED Results / Procedures / Treatments   Labs (all labs ordered are listed, but only abnormal results are displayed) Labs Reviewed   COMPREHENSIVE METABOLIC PANEL - Abnormal; Notable for the following components:      Result Value   BUN 6 (*)  All other components within normal limits  CBC WITH DIFFERENTIAL/PLATELET - Abnormal; Notable for the following components:   Hemoglobin 11.5 (*)    RDW 16.5 (*)    All other components within normal limits  URINALYSIS, ROUTINE W REFLEX MICROSCOPIC - Abnormal; Notable for the following components:   Color, Urine STRAW (*)    Specific Gravity, Urine 1.003 (*)    Hgb urine dipstick SMALL (*)    All other components within normal limits  RESP PANEL BY RT-PCR (FLU A&B, COVID) [id number]  TROPONIN I (HIGH SENSITIVITY)  TROPONIN I (HIGH SENSITIVITY)    EKG EKG Interpretation  Date/Time:  Monday June 15 2021 10:07:54 EDT Ventricular Rate:  70 PR Interval:  126 QRS Duration: 90 QT Interval:  392 QTC Calculation: 423 R Axis:   54 Text Interpretation: Normal sinus rhythm with sinus arrhythmia Normal ECG Confirmed by Lacretia Leigh ([id number]) on 06/15/2021 3:23:10 PM  Radiology DG Chest 2 View  Result Date: 06/15/2021 CLINICAL DATA:  Cough and chest pain EXAM: CHEST - 2 VIEW COMPARISON:  04/21/2021 FINDINGS: Heart size is normal. Aortic atherosclerotic calcification is noted. The lungs are clear. Pulmonary vascularity is normal. No effusions. No acute bone finding. IMPRESSION: No active cardiopulmonary disease. Electronically Signed   By: Nelson Chimes M.D.   On: 06/15/2021 14:20    Procedures Procedures   Medications Ordered in ED Medications - No data to display  ED Course  I have reviewed the triage vital signs and the nursing notes.  Pertinent labs & imaging results that were available during my care of the patient were reviewed by me and considered in my medical decision making (see chart for details).    MDM Rules/Calculators/A&P                           Additional history obtained from: Nursing notes from this visit. Review of electronic medical  records. ============== 70 year old female presented for URI symptoms x4 days now with burning pain when she coughs.  Overall she is well-appearing no acute distress vital signs are stable.  She has no history of hemoptysis, no syncope, pleurisy, tachycardia or hypoxia on room air.  Work-up today included 2 troponins both of which are within normal limits.  Negative COVID/influenza panel.  CMP without emergent electrolyte derangement, AKI, LFT elevations or gap.  A CBC with mild anemia of 11.5, no leukocytosis or thrombocytopenia.  Chest x-ray did not show any acute findings and EKG did not show any acute ischemic changes.  Suspect patient with unspecified viral URI at this point, no indication for antibiotics.  Given patient's Chest pain only occurs briefly after a cough that resolves completely and is not associated with exertion I have low suspicion for ACS as cause of symptoms. Additionally low suspicion for PE, dissection, bacterial pneumonia or other emergent cardiopulmonary etiology of patient's symptoms at this time.  Additionally patient has no meningismus, headache, vision changes to suggest meningitis or other emergent pathologies patient treated home symptomatic regimen including OTC anti-inflammatories increasing water intake.  I asked the patient to call her primary care provider today to schedule a follow-up appointment for follow-up visit.  At discharge patient reports that she has been experiencing some constipation and been using MiraLAX with only intermittent improvement, I will prescribe the patient Colace [place] patient's abdomen is soft nontender, no indication for intra-abdominal intervention at this point, her last bowel movement was today.  I ordered, reviewed and interpreted labs which include:  CBC, BMP, troponin x2, urinalysis, COVID/flu panel   At this time there does not appear to be any evidence of an acute emergency medical condition and the patient appears stable for discharge  with appropriate outpatient follow up. Diagnosis was discussed with patient who verbalizes understanding of care plan and is agreeable to discharge. I have discussed return precautions with patient and  who verbalizes understanding. Patient encouraged to follow-up with their PCP. All questions answered.  Patient was seen and evaluated by attending physician Dr. Zenia Resides during this visit agrees with work-up and discharge at this time.  Note: Portions of this report may have been transcribed using voice recognition software. Every effort was made to ensure accuracy; however, inadvertent computerized transcription errors may still be present.  Final Clinical Impression(s) / ED Diagnoses Final diagnoses:  Viral upper respiratory tract infection    Rx / DC Orders ED Discharge Orders          Ordered    docusate sodium (COLACE) 100 MG capsule  Daily        06/15/21 1524             Gari Crown 06/15/21 1530    Lacretia Leigh, MD 06/20/21 [phone]

## 2021-06-15 NOTE — ED Notes (Signed)
Pt d/c home per MD order . Discharge summary reviewed with pt, pt verbalizes understanding. No s/s of acute distress noted at discharge. Ambulatory off unit.

## 2021-06-15 NOTE — ED Triage Notes (Signed)
Pt. Stated, 3-4 days Ive felt weak and having chest pain.

## 2021-06-15 NOTE — ED Notes (Signed)
Patient transported to X-ray 

## 2021-06-16 ENCOUNTER — Telehealth: Payer: Self-pay

## 2021-06-16 NOTE — Telephone Encounter (Signed)
Pt is requesting a call back .Marland KitchenShe stated that she went to the ED yesterday, pt thinks that the new medication was causing her to have chest pain and congestion

## 2021-06-16 NOTE — Telephone Encounter (Signed)
Return call to pt - stated she went to ED yesterday. Stated her doctor started her on 3 new medications and she cannot tolerate. Stated they are Spironolactone, Atorvastatin (she has 20 mg and 40 mg tabs) and Ezetimibe - causing her chest to hurts and throwing up phlegm. Stated she needs something for congestion and refill on Zofran. And she does not have any energy - needs a refill on Potassium pill. Please call pt   Thanks

## 2021-06-26 NOTE — Telephone Encounter (Signed)
Patient left voicemail on my direct number and I attempted to call her back today. Regarding the information collected from Morrison Old, RN patient has been on all of these medications for quite some time and none of the three (atorvastatin, ezetimibe, and spironolactone) have been newly prescribed to her. When she came in for office visit with me on 06/02/21 she did not express any concerns regarding this medication. Patient also wished to keep the atorvastatin 20mg  tablets in addition to the 40mg  as she was aware she was supposed to take two of the 20mg . A number 2 was written on the bottle and I felt comfortable allowing patient to do this as she verbally expressed proper instructions. I am routing back to see if someone if planning to follow-up with patient regarding this information. If patient calls back please feel free to share this with her.

## 2021-07-06 ENCOUNTER — Other Ambulatory Visit: Payer: Self-pay | Admitting: *Deleted

## 2021-07-06 MED ORDER — HYDROCODONE-ACETAMINOPHEN 5-325 MG PO TABS: ORAL_TABLET | ORAL | 0 refills | Status: DC

## 2021-07-06 NOTE — Telephone Encounter (Signed)
Refill Request   HYDROcodone-acetaminophen (NORCO/VICODIN) 5-325 MG tablet   [id number] - [place], [place] - [place] (Ph: [phone])

## 2021-07-21 NOTE — Telephone Encounter (Signed)
Closing the loop on conversation - I agree with Dr. Georgina Bartlett that these medications are very unlikely to be causing the symptoms she describes.  This is a common situation for [name], as she questions the relationship between medications and new symptoms, which she often discusses then with her insurance nurse, who advises her one way or the other.  No potassium refill needed at this time, discontinued in favor of starting spironolactone for BP which will help with potassium and minimize her pill burden.  She equates fatigue with need for potassium.

## 2021-07-30 ENCOUNTER — Other Ambulatory Visit: Payer: Self-pay | Admitting: Internal Medicine

## 2021-07-30 ENCOUNTER — Encounter: Payer: Medicare Other | Admitting: Internal Medicine

## 2021-07-30 NOTE — Telephone Encounter (Signed)
Refill Request   cetirizine (ZYRTEC ALLERGY) 10 MG tablet  [facility] #[id number] - [place], [place] - [place] (Ph: [phone])

## 2021-07-30 NOTE — Telephone Encounter (Signed)
Patient is also requesting a call to discuss meds with doctor. RTC patient to ask about medication.  Message left that the Clinics had called to ask what she needs.

## 2021-07-30 NOTE — Telephone Encounter (Signed)
Pt calling back to ask her PCP to her back as she has some questions about her medications as well.  Pt was sch today with Dr Jimmye Norman but, cancelled due to a court date.

## 2021-07-31 ENCOUNTER — Other Ambulatory Visit: Payer: Self-pay | Admitting: *Deleted

## 2021-07-31 ENCOUNTER — Telehealth: Payer: Self-pay | Admitting: *Deleted

## 2021-07-31 MED ORDER — CETIRIZINE HCL 10 MG PO TABS: 10.0000 mg | ORAL_TABLET | Freq: Every day | ORAL | 3 refills | Status: DC | PRN

## 2021-07-31 NOTE — Telephone Encounter (Signed)
Call from patient asking about refill on her Allergy medication. Patient was informed that we are awaiting approval for the Cetirizine from Dr. Jimmye Norman.  Patient asked about her next appointment .  Next appointment is scheduled for 08/03/2021 .  Patient stated that she has gotten clarification for her medicine problem and is not sick..  Will await appointment with PCP in February.  Call to front desk appointment for 08/03/2021 was cancelled per patient request.

## 2021-08-03 ENCOUNTER — Encounter: Payer: Medicare Other | Admitting: Internal Medicine

## 2021-08-03 ENCOUNTER — Other Ambulatory Visit: Payer: Self-pay

## 2021-08-03 NOTE — Telephone Encounter (Signed)
Patient called back stating [facility] does not have refill from 10/7. Spoke with Suezanne Jacquet at [place] (after waiting 15 minutes) and he is unable to locate this Rx. Will route to PCP to resend.

## 2021-08-03 NOTE — Telephone Encounter (Signed)
tiotropium (SPIRIVA) 18 MCG inhalation capsule  fluticasone (FLOVENT HFA) 44 MCG/ACT inhaler, REFILL REQUEST @ [id number] - [place], [place] - [place].

## 2021-08-03 NOTE — Telephone Encounter (Signed)
Spiriva #90 with 3 refills sent 10/7. Flovent last filled 09/18/20 for #1 with 1 refill. Call placed to patient. States she will call pharmacy for refill on Spiriva. States she thinks she still has Flovent in her home. Unable to state how she has been taking this. Patient has been working with Dr. Georgina Peer on Med management. Will route to her.

## 2021-08-04 NOTE — Telephone Encounter (Signed)
Pt is calling back about tiotropium (SPIRIVA) 18 MCG inhalation capsule. States the pharmacy is waiting to hear back from the clinic.

## 2021-08-05 ENCOUNTER — Telehealth: Payer: Self-pay | Admitting: *Deleted

## 2021-08-05 ENCOUNTER — Other Ambulatory Visit: Payer: Self-pay | Admitting: Internal Medicine

## 2021-08-05 DIAGNOSIS — J4520 Mild intermittent asthma, uncomplicated: Secondary | ICD-10-CM

## 2021-08-05 MED ORDER — TIOTROPIUM BROMIDE MONOHYDRATE 18 MCG IN CAPS: 18.0000 ug | ORAL_CAPSULE | Freq: Every day | RESPIRATORY_TRACT | 3 refills | Status: DC

## 2021-08-05 NOTE — Telephone Encounter (Signed)
Call from patient stated has not gotten refill for Spiriva.  Call to Pharmacy prescription for has not been received.  Spoke to Dr. Jimmye Norman will reorder prescription for Spiriva.  Call to Pharmacy-prescription was received.  Call to Patient to notify of.  Message left that Pharmacy will call patient when prescription is ready.

## 2021-08-08 ENCOUNTER — Emergency Department (HOSPITAL_COMMUNITY): Payer: Medicare Other

## 2021-08-08 ENCOUNTER — Encounter (HOSPITAL_COMMUNITY): Payer: Self-pay | Admitting: *Deleted

## 2021-08-08 ENCOUNTER — Other Ambulatory Visit: Payer: Self-pay

## 2021-08-08 ENCOUNTER — Emergency Department (HOSPITAL_COMMUNITY)
Admission: EM | Admit: 2021-08-08 | Discharge: 2021-08-09 | Disposition: A | Discharge status: Home / Self Care | Payer: Medicare Other | Attending: Emergency Medicine | Admitting: Emergency Medicine

## 2021-08-08 DIAGNOSIS — K5900 Constipation, unspecified: Secondary | ICD-10-CM | POA: Insufficient documentation

## 2021-08-08 DIAGNOSIS — R000 Tachycardia, unspecified: Secondary | ICD-10-CM | POA: Diagnosis not present

## 2021-08-08 DIAGNOSIS — Z87891 Personal history of nicotine dependence: Secondary | ICD-10-CM | POA: Insufficient documentation

## 2021-08-08 DIAGNOSIS — E876 Hypokalemia: Secondary | ICD-10-CM | POA: Diagnosis not present

## 2021-08-08 DIAGNOSIS — R1031 Right lower quadrant pain: Secondary | ICD-10-CM | POA: Diagnosis not present

## 2021-08-08 DIAGNOSIS — R002 Palpitations: Secondary | ICD-10-CM | POA: Diagnosis not present

## 2021-08-08 DIAGNOSIS — R112 Nausea with vomiting, unspecified: Secondary | ICD-10-CM | POA: Diagnosis not present

## 2021-08-08 DIAGNOSIS — Z79899 Other long term (current) drug therapy: Secondary | ICD-10-CM | POA: Insufficient documentation

## 2021-08-08 DIAGNOSIS — J4520 Mild intermittent asthma, uncomplicated: Secondary | ICD-10-CM | POA: Diagnosis not present

## 2021-08-08 DIAGNOSIS — R197 Diarrhea, unspecified: Secondary | ICD-10-CM | POA: Insufficient documentation

## 2021-08-08 DIAGNOSIS — I10 Essential (primary) hypertension: Secondary | ICD-10-CM | POA: Diagnosis not present

## 2021-08-08 DIAGNOSIS — R109 Unspecified abdominal pain: Secondary | ICD-10-CM | POA: Diagnosis not present

## 2021-08-08 DIAGNOSIS — R1110 Vomiting, unspecified: Secondary | ICD-10-CM | POA: Diagnosis not present

## 2021-08-08 DIAGNOSIS — Z7951 Long term (current) use of inhaled steroids: Secondary | ICD-10-CM | POA: Diagnosis not present

## 2021-08-08 LAB — CBC WITH DIFFERENTIAL/PLATELET
Abs Immature Granulocytes: 0.01 10*3/uL (ref 0.00–0.07)
Basophils Absolute: 0 10*3/uL (ref 0.0–0.1)
Basophils Relative: 0 %
Eosinophils Absolute: 0.1 10*3/uL (ref 0.0–0.5)
Eosinophils Relative: 2 %
HCT: 36.8 % (ref 36.0–46.0)
Hemoglobin: 11.7 g/dL — ABNORMAL LOW (ref 12.0–15.0)
Immature Granulocytes: 0 %
Lymphocytes Relative: 38 %
Lymphs Abs: 1.8 10*3/uL (ref 0.7–4.0)
MCH: 26 pg (ref 26.0–34.0)
MCHC: 31.8 g/dL (ref 30.0–36.0)
MCV: 81.8 fL (ref 80.0–100.0)
Monocytes Absolute: 0.5 10*3/uL (ref 0.1–1.0)
Monocytes Relative: 10 %
Neutro Abs: 2.3 10*3/uL (ref 1.7–7.7)
Neutrophils Relative %: 50 %
Platelets: 275 10*3/uL (ref 150–400)
RBC: 4.5 MIL/uL (ref 3.87–5.11)
RDW: 15 % (ref 11.5–15.5)
WBC: 4.7 10*3/uL (ref 4.0–10.5)
nRBC: 0 % (ref 0.0–0.2)

## 2021-08-08 LAB — COMPREHENSIVE METABOLIC PANEL
ALT: 14 U/L (ref 0–44)
AST: 19 U/L (ref 15–41)
Albumin: 3.9 g/dL (ref 3.5–5.0)
Alkaline Phosphatase: 73 U/L (ref 38–126)
Anion gap: 11 (ref 5–15)
BUN: 7 mg/dL — ABNORMAL LOW (ref 8–23)
CO2: 23 mmol/L (ref 22–32)
Calcium: 9.5 mg/dL (ref 8.9–10.3)
Chloride: 106 mmol/L (ref 98–111)
Creatinine, Ser: 0.74 mg/dL (ref 0.44–1.00)
GFR, Estimated: >60 mL/min (ref >60)
Glucose, Bld: 108 mg/dL — ABNORMAL HIGH (ref 70–99)
Potassium: 2.7 mmol/L — CL (ref 3.5–5.1)
Sodium: 140 mmol/L (ref 135–145)
Total Bilirubin: 0.7 mg/dL (ref 0.3–1.2)
Total Protein: 7.4 g/dL (ref 6.5–8.1)

## 2021-08-08 LAB — TROPONIN I (HIGH SENSITIVITY): Troponin I (High Sensitivity): 7 ng/L (ref <18)

## 2021-08-08 LAB — MAGNESIUM: Magnesium: 1.9 mg/dL (ref 1.7–2.4)

## 2021-08-08 MED ORDER — LACTATED RINGERS IV BOLUS
1000.0000 mL | Freq: Once | INTRAVENOUS | Status: AC
  Administered 2021-08-09: 01:00:00 1000 mL via INTRAVENOUS

## 2021-08-08 MED ORDER — ONDANSETRON HCL 4 MG/2ML IJ SOLN
4.0000 mg | Freq: Once | INTRAMUSCULAR | Status: AC
  Administered 2021-08-09: 01:00:00 4 mg via INTRAVENOUS
  Filled 2021-08-08: qty 2

## 2021-08-08 NOTE — ED Triage Notes (Signed)
The pt has been c/o of a fluttering heart beat since 1800 nausea and vomiting

## 2021-08-08 NOTE — ED Provider Notes (Signed)
[facility] EMERGENCY DEPARTMENT Provider Note   CSN: [id number] Arrival date & time: 08/08/21  2049     History Chief Complaint  Patient presents with   Tachycardia    [name] is a 70 y.o. female.  The history is provided by the patient and medical records.  [name] is a 70 y.o. female who presents to the Emergency Department complaining of palpitations.  She presents to the ED for evaluation of palpitations.  She states that around 630pm she developed vomiting.  Shortly after the developed palpitations.  Two days ago she developed diarrhea, described as watery stools that developed after taking medications for constipation.  She also complains of acid reflux, worse than usual today.  Has associated chest discomfort.  Has abdominal discomfort.  No fever, dysuria.      Past Medical History:  Diagnosis Date   Asthma in adult, mild intermittent, uncomplicated    Chronic anticoagulation    Chronic constipation    Eczema    GERD (gastroesophageal reflux disease)    History of kidney stones    Hx of vaginal bleeding, resolved, ultrasound completed 10/11/2018   Hypertension    Personal history of atrial fibrillation, s/p ablation, now in NSR but remains on anticoaulation 11/26/2015   Followed in cardiology clinic     Patient Active Problem List   Diagnosis Date Noted   H/O hypokalemia 10/29/2020   Abdominal aortic atherosclerosis ([place]) 10/22/2020   Seasonal and perennial allergic (presumed) rhinitis 07/25/2020   Asthma in adult, mild intermittent, uncomplicated    Chronic recurrent bilateral lower abdominal pain 07/24/2020   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 ([place]) 07/23/2020   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 02/14/2020   Primary osteoarthritis involving multiple joints 06/14/2018   Osteoporosis without current pathological fracture 04/15/2017   Constipation, chronic 10/19/2016   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 09/10/2016   Hyperlipidemia 03/18/2016   GERD (gastroesophageal reflux disease)    Personal history of atrial fibrillation, now s/p ablation in NSR 11/26/2015   Marijuana abuse 06/11/2015   Hypertension     Past Surgical History:  Procedure Laterality Date   ATRIAL FIBRILLATION ABLATION N/A 09/30/2017   Procedure: ATRIAL FIBRILLATION ABLATION;  Surgeon: Thompson Grayer, MD;  Location: [facility];  Service: Cardiovascular;  Laterality: N/A;   CHOLECYSTECTOMY N/A 12/08/2018   Procedure: LAPAROSCOPIC CHOLECYSTECTOMY WITH INTRAOPERATIVE CHOLANGIOGRAM;  Surgeon: Jovita Kussmaul, MD;  Location: [facility];  Service: General;  Laterality: N/A;   ERCP N/A 12/09/2018   Procedure: ENDOSCOPIC RETROGRADE CHOLANGIOPANCREATOGRAPHY (ERCP);  Surgeon: Carol Ada, MD;  Location: [facility];  Service: Endoscopy;  Laterality: N/A;   SPHINCTEROTOMY  12/09/2018   Procedure: SPHINCTEROTOMY;  Surgeon: Carol Ada, MD;  Location: [facility] END[facility];  Service: Endoscopy;;   TONSILLECTOMY     TUBAL LIGATION       OB History   No obstetric history on file.     Family History  Problem Relation Age of Onset   Heart attack Mother        Young age   Breast cancer Sister        Late 34s; now s/p mastectomy    Lung cancer Brother        x 2   Cancer Sister    Cancer Sister    Cancer Sister    Colon cancer Neg Hx    Colon polyps Neg Hx    Esophageal cancer Neg Hx    Rectal cancer  Neg Hx    Stomach cancer Neg Hx     Social History   Tobacco Use   Smoking status: Former    Types: Cigarettes    Quit date: 10/24/2015    Years since quitting: 5.7   Smokeless tobacco: Never  Vaping Use   Vaping Use: Never used  Substance Use Topics   Alcohol use: Not Currently   Drug use: Not Currently    Types: Marijuana    Comment: Smoked marijuana in the past.    Home Medications Prior to Admission medications   Medication Sig Start Date End Date Taking? Authorizing Provider  ondansetron (ZOFRAN-ODT) 4  MG disintegrating tablet Take 1 tablet (4 mg total) by mouth every 8 (eight) hours as needed for nausea or vomiting. 08/09/21  Yes Quintella Reichert, MD  potassium chloride SA (KLOR-CON M) 20 MEQ tablet Take 1 tablet (20 mEq total) by mouth daily. 08/09/21  Yes Quintella Reichert, MD  alendronate (FOSAMAX) 70 MG tablet Take 1 tablet (70 mg total) by mouth every 7 (seven) days. Take with a full glass of water on an empty stomach. 06/09/21 06/09/22  Angelica Pou, MD  atorvastatin (LIPITOR) 40 MG tablet Take 1 tablet (40 mg total) by mouth daily. 04/17/21 04/17/22  Angelica Pou, MD  Azelastine-Fluticasone [phone] MCG/ACT SUSP One spray in each nostril twice a day, every day Patient taking differently: Place 1 spray into both nostrils as needed. 07/24/20   Angelica Pou, MD  cetirizine (ZYRTEC ALLERGY) 10 MG tablet Take 1 tablet (10 mg total) by mouth daily as needed for allergies. 07/31/21 07/31/22  Angelica Pou, MD  Cholecalciferol (VITAMIN D) 50 MCG (2000 UT) tablet Take 1 tablet (2,000 Units total) by mouth daily. 10/30/20   Angelica Pou, MD  diltiazem (CARDIZEM) 60 MG tablet TAKE 1 TABLET BY MOUTH EVERY 6 HOURS AS NEEDED Patient taking differently: Take 60 mg by mouth daily as needed (flutters). 11/05/20   Sherran Needs, NP  diltiazem (CARTIA XT) 120 MG 24 hr capsule Take 1 capsule (120 mg total) by mouth 2 (two) times daily. 06/09/21   Sherran Needs, NP  docusate sodium (COLACE) 100 MG capsule Take 1 capsule (100 mg total) by mouth daily. 06/15/21   Nuala Alpha A, PA-C  ezetimibe (ZETIA) 10 MG tablet Take 1 tablet (10 mg total) by mouth daily. 10/30/20 10/30/21  Angelica Pou, MD  fluticasone (FLOVENT HFA) 44 MCG/ACT inhaler Inhale 1 puff into the lungs daily. 09/18/20   Angelica Pou, MD  guaiFENesin (MUCUS RELIEF ADULT PO) Take by mouth.    [provider]  HYDROcodone-acetaminophen (NORCO/VICODIN) 5-325 MG tablet Take 1/2 tab to 1 tab if  needed for days when you are experiencing severe pain. 07/10/21   Angelica Pou, MD  levalbuterol [facility] HFA) 45 MCG/ACT inhaler INHALE 1 PUFF INTO THE LUNGS EVERY 6 HOURS AS NEEDED FOR SHORTNESS OF BREATH Patient taking differently: Inhale 1 puff into the lungs every 6 (six) hours as needed for shortness of breath. 09/14/20   Riesa Pope, MD  linaclotide (LINZESS) 72 MCG capsule TAKE 1 CAPSULE(145 MCG) BY MOUTH DAILY BEFORE BREAKFAST 06/09/21   Angelica Pou, MD  losartan (COZAAR) 50 MG tablet Take 2 tablets (100 mg total) by mouth daily. 09/18/20 06/02/21  Angelica Pou, MD  magnesium hydroxide (MILK OF MAGNESIA) 400 MG/5ML suspension Take 30 mLs by mouth daily as needed for moderate constipation. Patient not taking: Reported on 06/02/2021 04/17/21 04/17/22  Jimmye Norman,  Elaina Pattee, MD  montelukast (SINGULAIR) 10 MG tablet Take 1 tablet (10 mg total) by mouth at bedtime. 09/19/20   Angelica Pou, MD  omeprazole (PRILOSEC) 40 MG capsule TAKE 1 CAPSULE(40 MG) BY MOUTH DAILY Patient not taking: Reported on 06/02/2021 04/23/21   Sid Falcon, MD  rivaroxaban (XARELTO) 20 MG TABS tablet Take 1 tablet (20 mg total) by mouth daily with supper. 10/13/20   Sherran Needs, NP  spironolactone (ALDACTONE) 25 MG tablet Take 1 tablet (25 mg total) by mouth daily. 04/17/21 04/17/22  Angelica Pou, MD  sucralfate (CARAFATE) 1 g tablet Take 1 tablet (1 g total) by mouth 4 (four) times daily -  with meals and at bedtime. 04/05/21   Hayden Rasmussen, MD  tiotropium (SPIRIVA) 18 MCG inhalation capsule Place 1 capsule (18 mcg total) into inhaler and inhale daily. 08/05/21   Angelica Pou, MD    Allergies    Albuterol, Percocet [oxycodone-acetaminophen], Celecoxib, Protonix [pantoprazole sodium], and Tramadol  Review of Systems   Review of Systems  All other systems reviewed and are negative.  Physical Exam Updated Vital Signs BP (!) 137/99    Pulse 72    Temp 97.8  F (36.6 C) (Oral)    Resp 17    Ht 5\' 7"  (1.702 m)    Wt 65 kg    SpO2 98%    BMI 22.44 kg/m   Physical Exam Vitals and nursing note reviewed.  Constitutional:      Appearance: She is well-developed.  HENT:     Head: Normocephalic and atraumatic.  Cardiovascular:     Rate and Rhythm: Normal rate and regular rhythm.     Heart sounds: No murmur heard. Pulmonary:     Effort: Pulmonary effort is normal. No respiratory distress.     Breath sounds: Normal breath sounds.  Abdominal:     Palpations: Abdomen is soft.     Tenderness: There is no guarding or rebound.     Comments: Moderate generalized abdominal tenderness, greatest over RLQ  Musculoskeletal:        General: No swelling or tenderness.     Comments: 2+ DP pulses bilaterally  Skin:    General: Skin is warm and dry.  Neurological:     Mental Status: She is alert and oriented to person, place, and time.  Psychiatric:        Behavior: Behavior normal.    ED Results / Procedures / Treatments   Labs (all labs ordered are listed, but only abnormal results are displayed) Labs Reviewed  CBC WITH DIFFERENTIAL/PLATELET - Abnormal; Notable for the following components:      Result Value   Hemoglobin 11.7 (*)    All other components within normal limits  COMPREHENSIVE METABOLIC PANEL - Abnormal; Notable for the following components:   Potassium 2.7 (*)    Glucose, Bld 108 (*)    BUN 7 (*)    All other components within normal limits  MAGNESIUM  TROPONIN I (HIGH SENSITIVITY)  TROPONIN I (HIGH SENSITIVITY)    EKG EKG Interpretation  Date/Time:  Saturday August 08 2021 22:55:40 EST Ventricular Rate:  77 PR Interval:  128 QRS Duration: 90 QT Interval:  419 QTC Calculation: 462 R Axis:   63 Text Interpretation: Sinus rhythm Multiple ventricular premature complexes Confirmed by Quintella Reichert [phone]) on 08/08/2021 11:13:27 PM  Radiology DG Chest 2 View  Result Date: 08/09/2021 CLINICAL DATA:  Fluttering heart  beat with nausea and vomiting. EXAM: CHEST -  2 VIEW COMPARISON:  June 15, 2021 FINDINGS: The heart size and mediastinal contours are within normal limits. Both lungs are clear. The visualized skeletal structures are unremarkable. IMPRESSION: No active cardiopulmonary disease. Electronically Signed   By: Virgina Norfolk M.D.   On: 08/09/2021 00:04   CT Abdomen Pelvis W Contrast  Result Date: 08/09/2021 CLINICAL DATA:  Right lower quadrant abdominal pain. EXAM: CT ABDOMEN AND PELVIS WITH CONTRAST TECHNIQUE: Multidetector CT imaging of the abdomen and pelvis was performed using the standard protocol following bolus administration of intravenous contrast. CONTRAST:  150mL OMNIPAQUE IOHEXOL 300 MG/ML  SOLN COMPARISON:  April 21, 2021 FINDINGS: Lower chest: No acute abnormality. Hepatobiliary: Multiple small cystic appearing areas are seen within the right and left lobes of the liver. The largest is seen within the anteromedial aspect of the right lobe and measures approximately 1.1 cm in diameter. Status post cholecystectomy. No biliary dilatation. Pancreas: Unremarkable. No pancreatic ductal dilatation or surrounding inflammatory changes. Spleen: Normal in size without focal abnormality. Adrenals/Urinary Tract: Adrenal glands are unremarkable. Kidneys are normal, without renal calculi or hydronephrosis. A 7 mm cystic appearing area is seen along the medial aspect of the mid left kidney. Bladder is unremarkable. Stomach/Bowel: There is a small hiatal hernia. Appendix appears normal. No evidence of bowel wall thickening, distention, or inflammatory changes. Vascular/Lymphatic: Aortic atherosclerosis. No enlarged abdominal or pelvic lymph nodes. Reproductive: Uterus and bilateral adnexa are unremarkable. Other: No abdominal wall hernia or abnormality. No abdominopelvic ascites. Musculoskeletal: Multilevel degenerative changes seen throughout the lumbar spine. IMPRESSION: 1. Multiple hepatic cysts. 2. Small  hiatal hernia. 3. Evidence of prior cholecystectomy. 4. Normal appendix. 5. Aortic atherosclerosis. Aortic Atherosclerosis (ICD10-I70.0). Electronically Signed   By: Virgina Norfolk M.D.   On: 08/09/2021 00:31    Procedures Procedures   Medications Ordered in ED Medications  lactated ringers bolus 1,000 mL (0 mLs Intravenous Stopped 08/09/21 0247)  ondansetron (ZOFRAN) injection 4 mg (4 mg Intravenous Given 08/09/21 0044)  iohexol (OMNIPAQUE) 300 MG/ML solution 100 mL (100 mLs Intravenous Contrast Given 08/09/21 0020)  potassium chloride (KLOR-CON) packet 40 mEq (40 mEq Oral Given 08/09/21 0049)  alum & mag hydroxide-simeth (MAALOX/MYLANTA) 200-200-20 MG/5ML suspension 30 mL (30 mLs Oral Given 08/09/21 0118)    ED Course  I have reviewed the triage vital signs and the nursing notes.  Pertinent labs & imaging results that were available during my care of the patient were reviewed by me and considered in my medical decision making (see chart for details).    MDM Rules/Calculators/A&P                         Patient here for evaluation of vomiting, diarrhea, palpitations and abdominal discomfort.  She is nontoxic-appearing on evaluation.  Labs significant for hypokalemia.  EKG with PVCs, no acute ischemic changes.  Hemoglobin is near her baseline.  She was treated with IV fluids in the emergency department for dehydration as well as potassium replacement.  No evidence of bowel obstruction or serious intra-abdominal pathology on imaging.  After treatment in the emergency department she is feeling improved and able to tolerate orals.  Plan to discharge home with supplemental potassium, PCP follow-up, antiemetic and return precautions.    Final Clinical Impression(s) / ED Diagnoses Final diagnoses:  Hypokalemia  Palpitations    Rx / DC Orders ED Discharge Orders          Ordered    ondansetron (ZOFRAN-ODT) 4 MG disintegrating tablet  Every  8 hours PRN        08/09/21 0302     potassium chloride SA (KLOR-CON M) 20 MEQ tablet  Daily        08/09/21 0302             Quintella Reichert, MD 08/09/21 [phone]

## 2021-08-08 NOTE — ED Provider Notes (Signed)
Emergency Medicine Provider Triage Evaluation Note  [name] , a 70 y.o. female  was evaluated in triage.  Pt complains of chest pain, reflux and vomiting.   Review of Systems  Positive: Diarrhea and vomiting Negative: Fever   Physical Exam  BP (!) 156/103    Pulse 78    Temp 98.6 F (37 C) (Oral)    Resp 18    Ht 5\' 7"  (1.702 m)    Wt 65 kg    SpO2 96%    BMI 22.44 kg/m  Gen:   Awake, no distress   Resp:  Normal effort  MSK:   Moves extremities without difficulty  Other:    Medical Decision Making  Medically screening exam initiated at 9:40 PM.  Appropriate orders placed.  [name] was informed that the remainder of the evaluation will be completed by another provider, this initial triage assessment does not replace that evaluation, and the importance of remaining in the ED until their evaluation is complete.     [name], [name] 08/08/21 2141    Shelby Sorrow, MD 08/12/21 1327

## 2021-08-09 ENCOUNTER — Emergency Department (HOSPITAL_COMMUNITY): Payer: Medicare Other

## 2021-08-09 DIAGNOSIS — R109 Unspecified abdominal pain: Secondary | ICD-10-CM | POA: Diagnosis not present

## 2021-08-09 DIAGNOSIS — R112 Nausea with vomiting, unspecified: Secondary | ICD-10-CM | POA: Diagnosis not present

## 2021-08-09 DIAGNOSIS — R000 Tachycardia, unspecified: Secondary | ICD-10-CM | POA: Diagnosis not present

## 2021-08-09 LAB — TROPONIN I (HIGH SENSITIVITY): Troponin I (High Sensitivity): 7 ng/L (ref <18)

## 2021-08-09 MED ORDER — ALUM & MAG HYDROXIDE-SIMETH 200-200-20 MG/5ML PO SUSP
30.0000 mL | Freq: Once | ORAL | Status: AC
  Administered 2021-08-09: 01:00:00 30 mL via ORAL
  Filled 2021-08-09: qty 30

## 2021-08-09 MED ORDER — POTASSIUM CHLORIDE CRYS ER 20 MEQ PO TBCR: 20.0000 meq | EXTENDED_RELEASE_TABLET | Freq: Every day | ORAL | 0 refills | Status: DC

## 2021-08-09 MED ORDER — IOHEXOL 300 MG/ML  SOLN
100.0000 mL | Freq: Once | INTRAMUSCULAR | Status: AC | PRN
  Administered 2021-08-09: 100 mL via INTRAVENOUS

## 2021-08-09 MED ORDER — ONDANSETRON 4 MG PO TBDP: 4.0000 mg | ORAL_TABLET | Freq: Three times a day (TID) | ORAL | 0 refills | Status: DC | PRN

## 2021-08-09 MED ORDER — POTASSIUM CHLORIDE 20 MEQ PO PACK
40.0000 meq | PACK | Freq: Once | ORAL | Status: AC
  Administered 2021-08-09: 01:00:00 40 meq via ORAL
  Filled 2021-08-09: qty 2

## 2021-08-09 NOTE — ED Notes (Signed)
Patient transported to CT 

## 2021-08-09 NOTE — ED Notes (Signed)
Pt drank powdered potassium in approx. 6ox of water and has done well keeping it down.

## 2021-08-09 NOTE — Discharge Instructions (Addendum)
Your potassium was low today.  Please see your doctor to have your potassium rechecked in the next week.  Get rechecked sooner if you have new or concerning symptoms.    You can take Colace or Senakot available over the counter according to label instructions.

## 2021-08-10 ENCOUNTER — Telehealth: Payer: Self-pay

## 2021-08-10 NOTE — Telephone Encounter (Signed)
Returned call to patient. States she was seen in ED over the weekend and potassium was low (2.7). She was advised to take 20 mEq daily and be rechecked at PCP's. She has not p/u potassium yet. Advised her to p/u and start taking today. States she will. F/u scheduled for 12/27 at 0915.

## 2021-08-10 NOTE — Telephone Encounter (Signed)
Requesting to speak with a nurse about potassium chloride SA (KLOR-CON M) 20 MEQ tablet. Please call pt back.

## 2021-08-14 ENCOUNTER — Other Ambulatory Visit (HOSPITAL_COMMUNITY): Payer: Self-pay | Admitting: *Deleted

## 2021-08-14 MED ORDER — DILTIAZEM HCL 60 MG PO TABS: 60.0000 mg | ORAL_TABLET | Freq: Four times a day (QID) | ORAL | 1 refills | Status: DC | PRN

## 2021-08-18 ENCOUNTER — Other Ambulatory Visit: Payer: Self-pay

## 2021-08-18 ENCOUNTER — Ambulatory Visit (INDEPENDENT_AMBULATORY_CARE_PROVIDER_SITE_OTHER): Payer: Medicare Other | Admitting: Internal Medicine

## 2021-08-18 ENCOUNTER — Encounter: Payer: Self-pay | Admitting: Internal Medicine

## 2021-08-18 ENCOUNTER — Telehealth: Payer: Self-pay | Admitting: Internal Medicine

## 2021-08-18 VITALS — BP 131/82 | HR 84 | Temp 97.8°F | Ht 67.0 in | Wt 137.3 lb

## 2021-08-18 DIAGNOSIS — Z8639 Personal history of other endocrine, nutritional and metabolic disease: Secondary | ICD-10-CM | POA: Diagnosis not present

## 2021-08-18 DIAGNOSIS — K5909 Other constipation: Secondary | ICD-10-CM | POA: Diagnosis not present

## 2021-08-18 DIAGNOSIS — E876 Hypokalemia: Secondary | ICD-10-CM

## 2021-08-18 LAB — BASIC METABOLIC PANEL
Anion gap: 8 (ref 5–15)
BUN: 8 mg/dL (ref 8–23)
CO2: 26 mmol/L (ref 22–32)
Calcium: 9.9 mg/dL (ref 8.9–10.3)
Chloride: 105 mmol/L (ref 98–111)
Creatinine, Ser: 0.82 mg/dL (ref 0.44–1.00)
GFR, Estimated: >60 mL/min (ref >60)
Glucose, Bld: 93 mg/dL (ref 70–99)
Potassium: 3.9 mmol/L (ref 3.5–5.1)
Sodium: 139 mmol/L (ref 135–145)

## 2021-08-18 LAB — MAGNESIUM: Magnesium: 2.4 mg/dL (ref 1.7–2.4)

## 2021-08-18 MED ORDER — LINACLOTIDE 145 MCG PO CAPS: 145.0000 ug | ORAL_CAPSULE | Freq: Every day | ORAL | 3 refills | Status: DC

## 2021-08-18 MED ORDER — POTASSIUM CHLORIDE CRYS ER 10 MEQ PO TBCR: 10.0000 meq | EXTENDED_RELEASE_TABLET | Freq: Every day | ORAL | 0 refills | Status: DC

## 2021-08-18 NOTE — Assessment & Plan Note (Signed)
Patient presented to ED with tachycardia after 2 days of n/v/d. EKG with frequent PVC's. She was hypokalemic without EKG changes. Oral repletion prescribed and here in [facility] for follow up. Reviewed medications and no obvious offender. Low Mag could be cause of low K. She is on PPI. Will check Mag and Phos. This has been a chronic problem. If still low will start low dose daily supplementation. She was on this previously and stopped given no reason for it found.

## 2021-08-18 NOTE — Patient Instructions (Signed)
Thank you, Ms.[name] for allowing [name] to provide your care today. Today we discussed low potassium and chronic constipation.    I have ordered the following labs for you:   Lab Orders         BMP w Anion Gap (STAT/Sunquest-performed on-site)         Magnesium      Tests ordered today:  none  Referrals ordered today:   Referral Orders  No referral(s) requested today     I have ordered the following medication/changed the following medications:   Stop the following medications: Medications Discontinued During This Encounter  Medication Reason   linaclotide (LINZESS) 72 MCG capsule      Start the following medications: Meds ordered this encounter  Medications   linaclotide (LINZESS) 145 MCG CAPS capsule    Sig: Take 1 capsule (145 mcg total) by mouth daily before breakfast.    Dispense:  90 capsule    Refill:  3     Follow up: 2-3 months    Remember: I will call with results of lab work.   Should you have any questions or concerns please call the internal medicine clinic at [phone].      Tamsen Snider, M.D. [place]

## 2021-08-18 NOTE — Progress Notes (Signed)
Internal Medicine Clinic Attending  Case discussed with Dr. Court Joy  At the time of the visit.  We reviewed the residents history and exam and pertinent patient test results.  I agree with the assessment, diagnosis, and plan of care documented in the residents note. [name] had cancelled an appt with me last week - she and I need to get back on schedule for routine f/u.

## 2021-08-18 NOTE — Telephone Encounter (Signed)
Potassium 3.9 today and Mg wnl. Will start 10 meq daily potassium supplement. Follow up with PCP February 9th.

## 2021-08-18 NOTE — Assessment & Plan Note (Signed)
Recent visit to ED  n/v/d. She says this has resolved. She has had 2 days of constipation and  Uncomfortable due to bilateral lower abdominal pain . She has tried [employer]. She is taking Linzess.   Assessment/Plan: Chronic constipation, uncontrolled. Increase Linzess dose today. Colonoscopy up to date. - linaclotide (LINZESS) 145 MCG CAPS capsule; Take 1 capsule (145 mcg total) by mouth daily before breakfast.  Dispense: 90 capsule; Refill: 3

## 2021-08-18 NOTE — Progress Notes (Signed)
° °  CC: hypokalemia , constipation  HPI:Ms.GLYNNA FAILLA is a 70 y.o. female who presents for evaluation of hypokalemia and constipation. We also reviewed all medications and I wrote problem the medication was treating on her AVS. Please see individual problem based A/P for details  Past Medical History:  Diagnosis Date   Asthma in adult, mild intermittent, uncomplicated    Chronic anticoagulation    Chronic constipation    Eczema    GERD (gastroesophageal reflux disease)    History of kidney stones    Hx of vaginal bleeding, resolved, ultrasound completed 10/11/2018   Hypertension    Personal history of atrial fibrillation, s/p ablation, now in NSR but remains on anticoaulation 11/26/2015   Followed in cardiology clinic    Review of Systems:   Review of Systems  Constitutional:  Negative for chills and fever.  Gastrointestinal:  Positive for constipation. Negative for abdominal pain, blood in stool, melena, nausea and vomiting.    Physical Exam: Vitals:   08/18/21 0840  BP: 131/82  Pulse: 84  Temp: 97.8 F (36.6 C)  TempSrc: Oral  SpO2: 100%  Weight: 137 lb 4.8 oz (62.3 kg)  Height: 5\' 7"  (1.702 m)   General: Patient presents with large bag of medications, she appears well kept. She needs instructions frequently repeated. Cardiovascular: Normal rate, regular rhythm.  No murmurs Pulmonary : Equal breath sounds, No wheezes, rales, or rhonchi Abdominal: soft, mild tenderness in bilateral lower quadrants, no guarding ,bowel sounds present  Assessment & Plan:   See Encounters Tab for problem based charting.  Patient discussed with Dr. Jimmye Norman

## 2021-08-27 ENCOUNTER — Other Ambulatory Visit: Payer: Self-pay | Admitting: Internal Medicine

## 2021-08-27 ENCOUNTER — Other Ambulatory Visit: Payer: Self-pay

## 2021-08-27 DIAGNOSIS — E7841 Elevated Lipoprotein(a): Secondary | ICD-10-CM

## 2021-08-27 DIAGNOSIS — I10 Essential (primary) hypertension: Secondary | ICD-10-CM

## 2021-08-27 MED ORDER — LOSARTAN POTASSIUM 50 MG PO TABS: 100.0000 mg | ORAL_TABLET | Freq: Every day | ORAL | 3 refills | Status: DC

## 2021-08-27 MED ORDER — HYDROCODONE-ACETAMINOPHEN 5-325 MG PO TABS: ORAL_TABLET | ORAL | 0 refills | Status: DC

## 2021-08-27 MED ORDER — ATORVASTATIN CALCIUM 40 MG PO TABS: 40.0000 mg | ORAL_TABLET | Freq: Every day | ORAL | 3 refills | Status: DC

## 2021-08-27 NOTE — Telephone Encounter (Signed)
Refill Request   HYDROcodone-acetaminophen (NORCO/VICODIN) 5-325 MG tablet  [id number] - [place], [place] [address] (Ph: [phone])

## 2021-08-29 ENCOUNTER — Emergency Department (HOSPITAL_COMMUNITY)
Admission: EM | Admit: 2021-08-29 | Discharge: 2021-08-29 | Disposition: A | Discharge status: Home / Self Care | Payer: Medicare Other | Attending: Emergency Medicine | Admitting: Emergency Medicine

## 2021-08-29 ENCOUNTER — Emergency Department (HOSPITAL_COMMUNITY): Payer: Medicare Other

## 2021-08-29 ENCOUNTER — Other Ambulatory Visit: Payer: Self-pay

## 2021-08-29 ENCOUNTER — Encounter (HOSPITAL_COMMUNITY): Payer: Self-pay

## 2021-08-29 DIAGNOSIS — I700 Atherosclerosis of aorta: Secondary | ICD-10-CM | POA: Diagnosis not present

## 2021-08-29 DIAGNOSIS — K5900 Constipation, unspecified: Secondary | ICD-10-CM | POA: Insufficient documentation

## 2021-08-29 DIAGNOSIS — R109 Unspecified abdominal pain: Secondary | ICD-10-CM | POA: Diagnosis not present

## 2021-08-29 DIAGNOSIS — R079 Chest pain, unspecified: Secondary | ICD-10-CM | POA: Diagnosis not present

## 2021-08-29 DIAGNOSIS — R0602 Shortness of breath: Secondary | ICD-10-CM | POA: Diagnosis not present

## 2021-08-29 DIAGNOSIS — Z79899 Other long term (current) drug therapy: Secondary | ICD-10-CM | POA: Insufficient documentation

## 2021-08-29 DIAGNOSIS — R0789 Other chest pain: Secondary | ICD-10-CM | POA: Diagnosis not present

## 2021-08-29 LAB — LIPASE, BLOOD: Lipase: 30 U/L (ref 11–51)

## 2021-08-29 LAB — BASIC METABOLIC PANEL
Anion gap: 5 (ref 5–15)
BUN: 8 mg/dL (ref 8–23)
CO2: 27 mmol/L (ref 22–32)
Calcium: 10.4 mg/dL — ABNORMAL HIGH (ref 8.9–10.3)
Chloride: 105 mmol/L (ref 98–111)
Creatinine, Ser: 0.74 mg/dL (ref 0.44–1.00)
GFR, Estimated: >60 mL/min (ref >60)
Glucose, Bld: 111 mg/dL — ABNORMAL HIGH (ref 70–99)
Potassium: 3.4 mmol/L — ABNORMAL LOW (ref 3.5–5.1)
Sodium: 137 mmol/L (ref 135–145)

## 2021-08-29 LAB — MAGNESIUM: Magnesium: 1.9 mg/dL (ref 1.7–2.4)

## 2021-08-29 LAB — HEPATIC FUNCTION PANEL
ALT: 17 U/L (ref 0–44)
AST: 21 U/L (ref 15–41)
Albumin: 4 g/dL (ref 3.5–5.0)
Alkaline Phosphatase: 72 U/L (ref 38–126)
Bilirubin, Direct: <0.1 mg/dL (ref 0.0–0.2)
Total Bilirubin: 0.4 mg/dL (ref 0.3–1.2)
Total Protein: 7.7 g/dL (ref 6.5–8.1)

## 2021-08-29 LAB — CBC
HCT: 34.9 % — ABNORMAL LOW (ref 36.0–46.0)
Hemoglobin: 11.3 g/dL — ABNORMAL LOW (ref 12.0–15.0)
MCH: 26.1 pg (ref 26.0–34.0)
MCHC: 32.4 g/dL (ref 30.0–36.0)
MCV: 80.6 fL (ref 80.0–100.0)
Platelets: 244 10*3/uL (ref 150–400)
RBC: 4.33 MIL/uL (ref 3.87–5.11)
RDW: 14.7 % (ref 11.5–15.5)
WBC: 5 10*3/uL (ref 4.0–10.5)
nRBC: 0 % (ref 0.0–0.2)

## 2021-08-29 LAB — TROPONIN I (HIGH SENSITIVITY)
Troponin I (High Sensitivity): 5 ng/L (ref <18)
Troponin I (High Sensitivity): 5 ng/L (ref <18)

## 2021-08-29 MED ORDER — GOLYTELY 236 G PO SOLR: 4000.0000 mL | Freq: Once | ORAL | 0 refills | Status: AC

## 2021-08-29 MED ORDER — GLYCERIN (ADULT) 2 G RE SUPP: 1.0000 | RECTAL | 0 refills | Status: DC | PRN

## 2021-08-29 MED ORDER — ONDANSETRON HCL 4 MG/2ML IJ SOLN
4.0000 mg | Freq: Once | INTRAMUSCULAR | Status: AC
  Administered 2021-08-29: 4 mg via INTRAVENOUS
  Filled 2021-08-29: qty 2

## 2021-08-29 MED ORDER — IOHEXOL 300 MG/ML  SOLN
100.0000 mL | Freq: Once | INTRAMUSCULAR | Status: AC | PRN
  Administered 2021-08-29: 100 mL via INTRAVENOUS

## 2021-08-29 MED ORDER — SODIUM CHLORIDE 0.9 % IV BOLUS
500.0000 mL | Freq: Once | INTRAVENOUS | Status: AC
  Administered 2021-08-29: 500 mL via INTRAVENOUS

## 2021-08-29 NOTE — ED Provider Notes (Signed)
[facility] EMERGENCY DEPARTMENT Provider Note   CSN: [id number] Arrival date & time: 08/29/21  2836     History  Chief Complaint  Patient presents with   Chest Pain    [name] is a 71 y.o. female presenting for evaluation of constipation, chest pain, palpitations.  Patient states she has been unable to have a bowel movement for the past 3 to 4 days.  She states she is straining very hard, but no stool is produced.  She is passing some gas.  She was seen by her PCP, started on a higher dose of Linzess without improvement of symptoms.  She is also taking MiraLAX and stool softeners without improvement.  She states due to the straining, she is now having increased pain and nausea.  She is also now having chest pain and palpitations, which is normal for her when she has stomach issues.  She does report a history of GERD, for which she takes medication.  She denies fever, cough, shortness of breath, vomiting, or urinary symptoms.  She has never had a bowel obstruction before, but has had gallbladder surgery.  HPI     Home Medications Prior to Admission medications   Medication Sig Start Date End Date Taking? Authorizing Provider  alendronate (FOSAMAX) 70 MG tablet Take 1 tablet (70 mg total) by mouth every 7 (seven) days. Take with a full glass of water on an empty stomach. 06/09/21 06/09/22 Yes Angelica Pou, MD  atorvastatin (LIPITOR) 40 MG tablet Take 1 tablet (40 mg total) by mouth daily. 08/27/21 08/27/22 Yes Angelica Pou, MD  Azelastine-Fluticasone [phone] MCG/ACT SUSP One spray in each nostril twice a day, every day Patient taking differently: Place 1 spray into both nostrils 2 (two) times daily as needed (rhinitis). 07/24/20  Yes Angelica Pou, MD  cetirizine (ZYRTEC ALLERGY) 10 MG tablet Take 1 tablet (10 mg total) by mouth daily as needed for allergies. 07/31/21 07/31/22 Yes Angelica Pou, MD  diltiazem (CARDIZEM) 60 MG tablet Take 1  tablet (60 mg total) by mouth every 6 (six) hours as needed (afib). 08/14/21  Yes Sherran Needs, NP  diltiazem (CARTIA XT) 120 MG 24 hr capsule Take 1 capsule (120 mg total) by mouth 2 (two) times daily. 06/09/21  Yes Sherran Needs, NP  docusate sodium (COLACE) 100 MG capsule Take 1 capsule (100 mg total) by mouth daily. 06/15/21  Yes Nuala Alpha A, PA-C  ezetimibe (ZETIA) 10 MG tablet Take 1 tablet (10 mg total) by mouth daily. 10/30/20 10/30/21 Yes Angelica Pou, MD  fluticasone (FLOVENT HFA) 44 MCG/ACT inhaler Inhale 1 puff into the lungs daily. 09/18/20  Yes Angelica Pou, MD  glycerin adult 2 g suppository Place 1 suppository rectally as needed for constipation. 08/29/21  Yes Mukesh Kornegay, PA-C  HYDROcodone-acetaminophen (NORCO/VICODIN) 5-325 MG tablet Take 1/2 tab to 1 tab if needed for days when you are experiencing severe pain. Patient taking differently: Take 0.5-1 tablets by mouth every 4 (four) hours as needed for severe pain. 08/27/21  Yes Angelica Pou, MD  levalbuterol [facility] HFA) 45 MCG/ACT inhaler INHALE 1 PUFF INTO THE LUNGS EVERY 6 HOURS AS NEEDED FOR SHORTNESS OF BREATH Patient taking differently: Inhale 1 puff into the lungs every 6 (six) hours as needed for shortness of breath. 09/14/20  Yes Katsadouros, Vasilios, MD  linaclotide [facility]) 145 MCG CAPS capsule Take 1 capsule (145 mcg total) by mouth daily before breakfast. 08/18/21  Yes Madalyn Rob,  MD  losartan (COZAAR) 50 MG tablet Take 2 tablets (100 mg total) by mouth daily. 08/27/21 01/09/22 Yes Angelica Pou, MD  montelukast (SINGULAIR) 10 MG tablet Take 1 tablet (10 mg total) by mouth at bedtime. 09/19/20  Yes Angelica Pou, MD  omeprazole (PRILOSEC) 40 MG capsule TAKE 1 CAPSULE(40 MG) BY MOUTH DAILY Patient taking differently: Take 40 mg by mouth daily. 04/23/21  Yes Sid Falcon, MD  ondansetron (ZOFRAN-ODT) 4 MG disintegrating tablet Take 1 tablet (4 mg total) by mouth every 8  (eight) hours as needed for nausea or vomiting. 08/09/21  Yes Quintella Reichert, MD  polyethylene glycol (GOLYTELY) 236 g solution Take 4,000 mLs by mouth once for 1 dose. 08/29/21 08/29/21 Yes Knox Holdman, PA-C  potassium chloride SA (KLOR-CON M) 10 MEQ tablet Take 1 tablet (10 mEq total) by mouth daily. 08/18/21  Yes Madalyn Rob, MD  rivaroxaban (XARELTO) 20 MG TABS tablet Take 1 tablet (20 mg total) by mouth daily with supper. 10/13/20  Yes Sherran Needs, NP  spironolactone (ALDACTONE) 25 MG tablet Take 1 tablet (25 mg total) by mouth daily. 04/17/21 04/17/22 Yes Angelica Pou, MD  sucralfate (CARAFATE) 1 g tablet Take 1 tablet (1 g total) by mouth 4 (four) times daily -  with meals and at bedtime. 04/05/21  Yes Hayden Rasmussen, MD  tiotropium (SPIRIVA) 18 MCG inhalation capsule Place 1 capsule (18 mcg total) into inhaler and inhale daily. 08/05/21  Yes Angelica Pou, MD  Cholecalciferol (VITAMIN D) 50 MCG (2000 UT) tablet Take 1 tablet (2,000 Units total) by mouth daily. Patient not taking: Reported on 08/29/2021 10/30/20   Angelica Pou, MD  magnesium hydroxide (MILK OF MAGNESIA) 400 MG/5ML suspension Take 30 mLs by mouth daily as needed for moderate constipation. Patient not taking: Reported on 06/02/2021 04/17/21 04/17/22  Angelica Pou, MD  potassium chloride SA (KLOR-CON M) 20 MEQ tablet Take 1 tablet (20 mEq total) by mouth daily. Patient not taking: Reported on 08/29/2021 08/09/21   Quintella Reichert, MD      Allergies    Albuterol, Percocet [oxycodone-acetaminophen], Celecoxib, Protonix [pantoprazole sodium], and Tramadol    Review of Systems   Review of Systems  Cardiovascular:  Positive for chest pain and palpitations.  Gastrointestinal:  Positive for abdominal pain and nausea.  All other systems reviewed and are negative.  Physical Exam Updated Vital Signs BP 134/80    Pulse 72    Temp 98.3 F (36.8 C)    Resp 17    Ht 5\' 7"  (1.702 m)    Wt 65.8 kg     SpO2 97%    BMI 22.71 kg/m  Physical Exam Vitals and nursing note reviewed.  Constitutional:      General: She is not in acute distress.    Appearance: Normal appearance.     Comments: Resting in the bed in no acute distress  HENT:     Head: Normocephalic and atraumatic.  Eyes:     Conjunctiva/sclera: Conjunctivae normal.     Pupils: Pupils are equal, round, and reactive to light.  Cardiovascular:     Rate and Rhythm: Normal rate and regular rhythm.     Pulses: Normal pulses.  Pulmonary:     Effort: Pulmonary effort is normal. No respiratory distress.     Breath sounds: Normal breath sounds. No wheezing.     Comments: Speaking in full sentences.  Clear lung sounds in all fields. Abdominal:     General: There  is no distension.     Palpations: Abdomen is soft. There is no mass.     Tenderness: There is abdominal tenderness. There is no guarding or rebound.     Comments: Diffuse tenderness palpation of the abdomen, worse on the left side.  No rigidity, guarding, distention.  Negative rebound.  No peritonitis  Musculoskeletal:        General: Normal range of motion.     Cervical back: Normal range of motion and neck supple.  Skin:    General: Skin is warm and dry.     Capillary Refill: Capillary refill takes less than 2 seconds.  Neurological:     Mental Status: She is alert and oriented to person, place, and time.  Psychiatric:        Mood and Affect: Mood and affect normal.        Speech: Speech normal.        Behavior: Behavior normal.    ED Results / Procedures / Treatments   Labs (all labs ordered are listed, but only abnormal results are displayed) Labs Reviewed  BASIC METABOLIC PANEL - Abnormal; Notable for the following components:      Result Value   Potassium 3.4 (*)    Glucose, Bld 111 (*)    Calcium 10.4 (*)    All other components within normal limits  CBC - Abnormal; Notable for the following components:   Hemoglobin 11.3 (*)    HCT 34.9 (*)    All other  components within normal limits  LIPASE, BLOOD  HEPATIC FUNCTION PANEL  MAGNESIUM  TROPONIN I (HIGH SENSITIVITY)  TROPONIN I (HIGH SENSITIVITY)    EKG EKG Interpretation  Date/Time:  Saturday August 29 2021 09:18:24 EST Ventricular Rate:  71 PR Interval:  123 QRS Duration: 76 QT Interval:  391 QTC Calculation: 425 R Axis:   56 Text Interpretation: Sinus rhythm Ventricular premature complex Minimal ST elevation, inferior leads No acute changes No significant change since last tracing Confirmed by Varney Biles [phone]) on 08/29/2021 11:22:41 AM  Radiology DG Chest 2 View  Result Date: 08/29/2021 CLINICAL DATA:  71 year old female with chest pain and shortness of breath. EXAM: CHEST - 2 VIEW COMPARISON:  Chest radiographs 08/08/2021 and earlier. FINDINGS: Lung volumes are stable at the upper limits of normal to mildly hyperinflated. Normal cardiac size and mediastinal contours. Visualized tracheal air column is within normal limits. Both lungs appear clear. No pneumothorax or pleural effusion. Stable cholecystectomy clips. Negative visible bowel gas. No acute osseous abnormality identified. IMPRESSION: No acute cardiopulmonary abnormality. Electronically Signed   By: Genevie Ann M.D.   On: 08/29/2021 07:48   CT ABDOMEN PELVIS W CONTRAST  Result Date: 08/29/2021 CLINICAL DATA:  71 year old female with abdominal pain and constipation. EXAM: CT ABDOMEN AND PELVIS WITH CONTRAST TECHNIQUE: Multidetector CT imaging of the abdomen and pelvis was performed using the standard protocol following bolus administration of intravenous contrast. CONTRAST:  155mL OMNIPAQUE IOHEXOL 300 MG/ML  SOLN COMPARISON:  CT Abdomen and Pelvis 08/09/2021 and earlier. FINDINGS: Lower chest: Negative. Hepatobiliary: Unchanged hepatic cysts since at least 2019. Surgically absent gallbladder. Subtle linear hypoenhancement in the right hepatic lobe on series 3, image 25 is stable since at least February last year and normalizes on  the delayed imaging as before. Pancreas: Negative. Spleen: Negative. Adrenals/Urinary Tract: Normal adrenal glands. Bilateral extrarenal pelves suspected (normal variant) with symmetric renal enhancement and contrast excretion. Ureters appear decompressed. Diminutive bladder. Multiple pelvic phleboliths but no convincing urinary calculus or obstructive  uropathy. Stomach/Bowel: Retained stool in the rectum, but upstream sigmoid and descending colon are decompressed. Redundant splenic flexure and transverse colon with retained stool throughout the transverse segment, hepatic flexure and right colon. Cecum partially located in the pelvis. Normal appendix on coronal image 57. Negative terminal ileum and no dilated small bowel. Stomach and duodenum appear negative. No free air or free fluid. Vascular/Lymphatic: Extensive Aortoiliac calcified atherosclerosis. Tortuous abdominal aorta. No aneurysmal enlargement. Major arterial structures appear to remain patent. Portal venous system is patent. No lymphadenopathy identified. Reproductive: Within normal limits. Other: No pelvic free fluid. Musculoskeletal: Chronic lumbar endplate degeneration and Schmorl's nodes. Chronic vacuum disc at L4-L5. No acute osseous abnormality identified. IMPRESSION: 1. No acute or inflammatory process identified in the abdomen or pelvis. 2. Redundant large bowel. Retained stool in the rectum, and also in the right through transverse colon. But decompressed descending and sigmoid segments. 3. Aortic Atherosclerosis (ICD10-I70.0). Electronically Signed   By: Genevie Ann M.D.   On: 08/29/2021 10:52    Procedures Procedures    Medications Ordered in ED Medications  sodium chloride 0.9 % bolus 500 mL (500 mLs Intravenous New Bag/Given 08/29/21 0944)  ondansetron (ZOFRAN) injection 4 mg (4 mg Intravenous Given 08/29/21 0941)  iohexol (OMNIPAQUE) 300 MG/ML solution 100 mL (100 mLs Intravenous Contrast Given 08/29/21 1022)    ED Course/ Medical  Decision Making/ A&P                           Medical Decision Making   This patient presents to the ED for concern of constipation, abd pain, cp, and palpitations. This involves an extensive number of treatment options, and is a complaint that carries with it a moderate risk of complications and morbidity.  The differential diagnosis includes bowel obstruction, constipation, gastritis, GERD, diverticulitis, pyelonephritis, viral GI illness, symptomatic A. fib.  Co morbidities: History of A. fib on anticoagulation, chronic abdominal pain and constipation   Additional history: Reviewed recent ER visits in the last 6 months, often presenting with abdominal symptoms causing chest pain and palpitations.  Patient frequently alternating between diarrhea and constipation.  Reviewed recent outpatient notes which show hypokalemia for which patient was given potassium   Lab Tests:  I ordered, and personally interpreted labs.  The pertinent results include: No leukocytosis.  Potassium very minimally low at 3.4, but not dangerous.  Kidney, liver, pancreatic function normal. Trop negative x2.   Imaging Studies:  I ordered imaging studies including chest x-ray and abd CT I independently visualized and interpreted imaging which showed no pneumonia, pnx, effusion. I agree with the radiologist interpretation  CT without small bowel obstruction, c/w constipation.    Medicines ordered:  I ordered medication including fluids and zofran for sx control.  Reevaluation of the patient after these medicines showed that the patient stayed the same   Dispostion:  After consideration of the diagnostic results and the patients response to treatment, I feel that the patent would benefit from symptomatic management at home with PCP f/u.  Discussed work-up with patient, including finding constipation but no finding of small bowel obstruction.  Discussed symptomatic management for constipation, importance of  taking medications and staying hydrated.  Urine loss as constipation appears to be a chronic issue for her, this is likely acute on chronic constipation, and can be managed outpatient.  At this time, patient appears safe for discharge.  Return precautions given.  Patient states she understands and agrees to plan  Final  Clinical Impression(s) / ED Diagnoses Final diagnoses:  Constipation, unspecified constipation type    Rx / DC Orders ED Discharge Orders          Ordered    glycerin adult 2 g suppository  As needed        08/29/21 1151    polyethylene glycol (GOLYTELY) 236 g solution   Once        08/29/21 [place], Clovia Reine, PA-C 08/29/21 Payne, Ankit, MD 08/30/21 1122

## 2021-08-29 NOTE — Discharge Instructions (Addendum)
Continue taking home medications as prescribed. Use a suppository to help soften your stool. Use medicine called GoLytely to help encourage bowel movement. Call your primary care doctor to set up a follow-up appointment. Make sure you stay well-hydrated with water. Eat a high-fiber diet to help prevent constipation. Return to the emergency room if you develop fever, persistent vomiting, severe worsening pain, any new, worsening, or concerning symptoms

## 2021-08-29 NOTE — ED Triage Notes (Signed)
Patient complains of constipation since Wednesday. Has used otc meds with no relief. Patient now complains of abdominal pain and pressure with palpitations

## 2021-08-31 ENCOUNTER — Telehealth: Payer: Self-pay

## 2021-08-31 NOTE — Telephone Encounter (Signed)
Requesting to speak with a nurse about something, please call back.  °

## 2021-08-31 NOTE — Telephone Encounter (Signed)
Returned call to patient. States she went to ED for constipation and was given glycerin suppositories to take. States she is having regular BMs now with Linzess, Miralax, and suppositories as needed. States ED also gave her polyethylene glycol (GOLYTELY) 236 g solution to take but states she does not need it and has not taken it. She confirmed appt with PCP on 10/01/21 at 0915.

## 2021-09-04 ENCOUNTER — Telehealth: Payer: Self-pay

## 2021-09-04 NOTE — Telephone Encounter (Signed)
Returned call to patient. She had questions on atorvastatin and Zetia. Wanted to know what they are for and when to take them.   Explained they were both for cholesterol. Advised she take atorvastatin at 6 PM with meal, and Zetia can be taken with her other daily meds at same time everyday. She states she will start them today and is very appreciative.

## 2021-09-04 NOTE — Telephone Encounter (Signed)
Questions about taking two medications, please call pt back.

## 2021-09-08 ENCOUNTER — Other Ambulatory Visit: Payer: Self-pay

## 2021-09-08 MED ORDER — FLUTICASONE PROPIONATE HFA 44 MCG/ACT IN AERO: 1.0000 | INHALATION_SPRAY | Freq: Every day | RESPIRATORY_TRACT | 1 refills | Status: DC

## 2021-09-11 ENCOUNTER — Telehealth: Payer: Self-pay

## 2021-09-11 NOTE — Telephone Encounter (Signed)
Patient is requesting a call back  again.

## 2021-09-11 NOTE — Telephone Encounter (Signed)
Please advise 

## 2021-09-11 NOTE — Telephone Encounter (Signed)
Pt states HYDROcodone-acetaminophen (NORCO/VICODIN) 5-325 MG tablet is not helping with her leg pain. Please call pt back.

## 2021-09-11 NOTE — Telephone Encounter (Signed)
Returned call to patient. States she didn't understand what the directions 0.5 to 1 tab meant so she's only been taking one half tab. States the med works when she takes a whole tab. She will go back to taking one whole tab q 6h PRN. She understands to take this for severe pain only. She again confirmed her appt for 2/9 with PCP.

## 2021-09-18 ENCOUNTER — Other Ambulatory Visit (HOSPITAL_COMMUNITY): Payer: Self-pay | Admitting: *Deleted

## 2021-09-18 MED ORDER — RIVAROXABAN 20 MG PO TABS: 20.0000 mg | ORAL_TABLET | Freq: Every day | ORAL | 11 refills | Status: DC

## 2021-09-18 MED ORDER — DILTIAZEM HCL ER COATED BEADS 120 MG PO CP24: 120.0000 mg | ORAL_CAPSULE | Freq: Two times a day (BID) | ORAL | 2 refills | Status: DC

## 2021-09-21 ENCOUNTER — Inpatient Hospital Stay (HOSPITAL_COMMUNITY)
Admission: EM | Admit: 2021-09-21 | Discharge: 2021-09-24 | DRG: 394 | Disposition: A | Discharge status: Home Health | Payer: Medicare Other | Attending: Internal Medicine | Admitting: Internal Medicine

## 2021-09-21 ENCOUNTER — Other Ambulatory Visit: Payer: Self-pay

## 2021-09-21 DIAGNOSIS — K635 Polyp of colon: Secondary | ICD-10-CM | POA: Diagnosis present

## 2021-09-21 DIAGNOSIS — D500 Iron deficiency anemia secondary to blood loss (chronic): Secondary | ICD-10-CM | POA: Diagnosis not present

## 2021-09-21 DIAGNOSIS — Z885 Allergy status to narcotic agent status: Secondary | ICD-10-CM | POA: Diagnosis not present

## 2021-09-21 DIAGNOSIS — R9431 Abnormal electrocardiogram [ECG] [EKG]: Secondary | ICD-10-CM | POA: Diagnosis not present

## 2021-09-21 DIAGNOSIS — E785 Hyperlipidemia, unspecified: Secondary | ICD-10-CM | POA: Diagnosis not present

## 2021-09-21 DIAGNOSIS — K5909 Other constipation: Secondary | ICD-10-CM | POA: Diagnosis present

## 2021-09-21 DIAGNOSIS — R1084 Generalized abdominal pain: Secondary | ICD-10-CM

## 2021-09-21 DIAGNOSIS — M150 Primary generalized (osteo)arthritis: Secondary | ICD-10-CM | POA: Diagnosis present

## 2021-09-21 DIAGNOSIS — A048 Other specified bacterial intestinal infections: Secondary | ICD-10-CM | POA: Diagnosis not present

## 2021-09-21 DIAGNOSIS — Z860100 Personal history of colon polyps, unspecified: Secondary | ICD-10-CM

## 2021-09-21 DIAGNOSIS — K219 Gastro-esophageal reflux disease without esophagitis: Secondary | ICD-10-CM | POA: Diagnosis not present

## 2021-09-21 DIAGNOSIS — Z7901 Long term (current) use of anticoagulants: Secondary | ICD-10-CM | POA: Diagnosis not present

## 2021-09-21 DIAGNOSIS — Z87891 Personal history of nicotine dependence: Secondary | ICD-10-CM

## 2021-09-21 DIAGNOSIS — M159 Polyosteoarthritis, unspecified: Secondary | ICD-10-CM | POA: Diagnosis present

## 2021-09-21 DIAGNOSIS — Z743 Need for continuous supervision: Secondary | ICD-10-CM | POA: Diagnosis not present

## 2021-09-21 DIAGNOSIS — Z79899 Other long term (current) drug therapy: Secondary | ICD-10-CM

## 2021-09-21 DIAGNOSIS — M1909 Primary osteoarthritis, other specified site: Secondary | ICD-10-CM | POA: Diagnosis not present

## 2021-09-21 DIAGNOSIS — Z8679 Personal history of other diseases of the circulatory system: Secondary | ICD-10-CM

## 2021-09-21 DIAGNOSIS — R109 Unspecified abdominal pain: Secondary | ICD-10-CM | POA: Diagnosis not present

## 2021-09-21 DIAGNOSIS — R1110 Vomiting, unspecified: Secondary | ICD-10-CM | POA: Diagnosis not present

## 2021-09-21 DIAGNOSIS — Z8249 Family history of ischemic heart disease and other diseases of the circulatory system: Secondary | ICD-10-CM | POA: Diagnosis not present

## 2021-09-21 DIAGNOSIS — Z87442 Personal history of urinary calculi: Secondary | ICD-10-CM | POA: Diagnosis not present

## 2021-09-21 DIAGNOSIS — E876 Hypokalemia: Secondary | ICD-10-CM | POA: Diagnosis present

## 2021-09-21 DIAGNOSIS — K644 Residual hemorrhoidal skin tags: Secondary | ICD-10-CM | POA: Diagnosis not present

## 2021-09-21 DIAGNOSIS — Z888 Allergy status to other drugs, medicaments and biological substances status: Secondary | ICD-10-CM

## 2021-09-21 DIAGNOSIS — I10 Essential (primary) hypertension: Secondary | ICD-10-CM | POA: Diagnosis present

## 2021-09-21 DIAGNOSIS — Z9049 Acquired absence of other specified parts of digestive tract: Secondary | ICD-10-CM

## 2021-09-21 DIAGNOSIS — Z20822 Contact with and (suspected) exposure to covid-19: Secondary | ICD-10-CM | POA: Diagnosis present

## 2021-09-21 DIAGNOSIS — Z7983 Long term (current) use of bisphosphonates: Secondary | ICD-10-CM

## 2021-09-21 DIAGNOSIS — K2970 Gastritis, unspecified, without bleeding: Secondary | ICD-10-CM | POA: Diagnosis present

## 2021-09-21 DIAGNOSIS — R197 Diarrhea, unspecified: Secondary | ICD-10-CM

## 2021-09-21 DIAGNOSIS — Z9851 Tubal ligation status: Secondary | ICD-10-CM

## 2021-09-21 DIAGNOSIS — K648 Other hemorrhoids: Principal | ICD-10-CM | POA: Diagnosis present

## 2021-09-21 DIAGNOSIS — R6889 Other general symptoms and signs: Secondary | ICD-10-CM | POA: Diagnosis not present

## 2021-09-21 DIAGNOSIS — K6289 Other specified diseases of anus and rectum: Secondary | ICD-10-CM | POA: Diagnosis not present

## 2021-09-21 DIAGNOSIS — I700 Atherosclerosis of aorta: Secondary | ICD-10-CM | POA: Diagnosis not present

## 2021-09-21 DIAGNOSIS — D509 Iron deficiency anemia, unspecified: Secondary | ICD-10-CM | POA: Diagnosis not present

## 2021-09-21 DIAGNOSIS — D649 Anemia, unspecified: Secondary | ICD-10-CM | POA: Diagnosis not present

## 2021-09-21 DIAGNOSIS — J45909 Unspecified asthma, uncomplicated: Secondary | ICD-10-CM | POA: Diagnosis not present

## 2021-09-21 LAB — COMPREHENSIVE METABOLIC PANEL
ALT: 17 U/L (ref 0–44)
AST: 21 U/L (ref 15–41)
Albumin: 3.7 g/dL (ref 3.5–5.0)
Alkaline Phosphatase: 56 U/L (ref 38–126)
Anion gap: 9 (ref 5–15)
BUN: 15 mg/dL (ref 8–23)
CO2: 25 mmol/L (ref 22–32)
Calcium: 9.8 mg/dL (ref 8.9–10.3)
Chloride: 106 mmol/L (ref 98–111)
Creatinine, Ser: 0.86 mg/dL (ref 0.44–1.00)
GFR, Estimated: >60 mL/min (ref >60)
Glucose, Bld: 122 mg/dL — ABNORMAL HIGH (ref 70–99)
Potassium: 4 mmol/L (ref 3.5–5.1)
Sodium: 140 mmol/L (ref 135–145)
Total Bilirubin: 0.4 mg/dL (ref 0.3–1.2)
Total Protein: 6.8 g/dL (ref 6.5–8.1)

## 2021-09-21 LAB — CBC WITH DIFFERENTIAL/PLATELET
Abs Immature Granulocytes: 0.02 10*3/uL (ref 0.00–0.07)
Basophils Absolute: 0 10*3/uL (ref 0.0–0.1)
Basophils Relative: 0 %
Eosinophils Absolute: 0.1 10*3/uL (ref 0.0–0.5)
Eosinophils Relative: 1 %
HCT: 30.8 % — ABNORMAL LOW (ref 36.0–46.0)
Hemoglobin: 10 g/dL — ABNORMAL LOW (ref 12.0–15.0)
Immature Granulocytes: 0 %
Lymphocytes Relative: 18 %
Lymphs Abs: 1 10*3/uL (ref 0.7–4.0)
MCH: 26.1 pg (ref 26.0–34.0)
MCHC: 32.5 g/dL (ref 30.0–36.0)
MCV: 80.4 fL (ref 80.0–100.0)
Monocytes Absolute: 0.5 10*3/uL (ref 0.1–1.0)
Monocytes Relative: 9 %
Neutro Abs: 3.9 10*3/uL (ref 1.7–7.7)
Neutrophils Relative %: 72 %
Platelets: 280 10*3/uL (ref 150–400)
RBC: 3.83 MIL/uL — ABNORMAL LOW (ref 3.87–5.11)
RDW: 15.6 % — ABNORMAL HIGH (ref 11.5–15.5)
WBC: 5.5 10*3/uL (ref 4.0–10.5)
nRBC: 0 % (ref 0.0–0.2)

## 2021-09-21 LAB — TSH: TSH: 1.291 u[IU]/mL (ref 0.350–4.500)

## 2021-09-21 LAB — APTT: aPTT: 33 seconds (ref 24–36)

## 2021-09-21 LAB — LIPASE, BLOOD: Lipase: 30 U/L (ref 11–51)

## 2021-09-21 LAB — RESP PANEL BY RT-PCR (FLU A&B, COVID) ARPGX2
Influenza A by PCR: NEGATIVE
Influenza B by PCR: NEGATIVE
SARS Coronavirus 2 by RT PCR: NEGATIVE

## 2021-09-21 LAB — PROTIME-INR
INR: 2.1 — ABNORMAL HIGH (ref 0.8–1.2)
Prothrombin Time: 23.2 seconds — ABNORMAL HIGH (ref 11.4–15.2)

## 2021-09-21 LAB — POC OCCULT BLOOD, ED: Fecal Occult Bld: POSITIVE — AB

## 2021-09-21 MED ORDER — LEVALBUTEROL HCL 0.63 MG/3ML IN NEBU: 0.6300 mg | INHALATION_SOLUTION | Freq: Four times a day (QID) | RESPIRATORY_TRACT | Status: DC | PRN

## 2021-09-21 MED ORDER — ALUM & MAG HYDROXIDE-SIMETH 200-200-20 MG/5ML PO SUSP
30.0000 mL | Freq: Once | ORAL | Status: AC
  Administered 2021-09-21: 30 mL via ORAL
  Filled 2021-09-21: qty 30

## 2021-09-21 MED ORDER — FAMOTIDINE 20 MG PO TABS
20.0000 mg | ORAL_TABLET | Freq: Every day | ORAL | Status: DC
  Administered 2021-09-21 – 2021-09-24 (×4): 20 mg via ORAL
  Filled 2021-09-21 (×4): qty 1

## 2021-09-21 MED ORDER — DILTIAZEM HCL 60 MG PO TABS
60.0000 mg | ORAL_TABLET | Freq: Four times a day (QID) | ORAL | Status: DC | PRN
  Filled 2021-09-21: qty 1

## 2021-09-21 MED ORDER — LOSARTAN POTASSIUM 50 MG PO TABS
100.0000 mg | ORAL_TABLET | Freq: Every day | ORAL | Status: DC
  Administered 2021-09-21 – 2021-09-24 (×4): 100 mg via ORAL
  Filled 2021-09-21 (×4): qty 2

## 2021-09-21 MED ORDER — SODIUM CHLORIDE 0.9 % IV BOLUS
500.0000 mL | Freq: Once | INTRAVENOUS | Status: AC
  Administered 2021-09-21: 500 mL via INTRAVENOUS

## 2021-09-21 MED ORDER — DILTIAZEM HCL 60 MG PO TABS: 60.0000 mg | ORAL_TABLET | Freq: Four times a day (QID) | ORAL | Status: DC | PRN

## 2021-09-21 MED ORDER — DILTIAZEM HCL ER COATED BEADS 120 MG PO CP24
120.0000 mg | ORAL_CAPSULE | Freq: Two times a day (BID) | ORAL | Status: DC
  Administered 2021-09-21 – 2021-09-23 (×6): 120 mg via ORAL
  Filled 2021-09-21 (×6): qty 1

## 2021-09-21 MED ORDER — HYDROCODONE-ACETAMINOPHEN 5-325 MG PO TABS
0.5000 | ORAL_TABLET | Freq: Four times a day (QID) | ORAL | Status: DC | PRN
  Administered 2021-09-21 (×2): 1 via ORAL
  Filled 2021-09-21 (×6): qty 1

## 2021-09-21 MED ORDER — SPIRONOLACTONE 25 MG PO TABS
25.0000 mg | ORAL_TABLET | Freq: Every day | ORAL | Status: DC
  Administered 2021-09-21 – 2021-09-24 (×4): 25 mg via ORAL
  Filled 2021-09-21 (×4): qty 1

## 2021-09-21 MED ORDER — UMECLIDINIUM BROMIDE 62.5 MCG/ACT IN AEPB
1.0000 | INHALATION_SPRAY | Freq: Every day | RESPIRATORY_TRACT | Status: DC
  Administered 2021-09-21: 1 via RESPIRATORY_TRACT
  Filled 2021-09-21 (×2): qty 7

## 2021-09-21 MED ORDER — BUDESONIDE 0.25 MG/2ML IN SUSP
0.2500 mg | Freq: Two times a day (BID) | RESPIRATORY_TRACT | Status: DC
  Administered 2021-09-21 – 2021-09-24 (×7): 0.25 mg via RESPIRATORY_TRACT
  Filled 2021-09-21 (×7): qty 2

## 2021-09-21 NOTE — ED Triage Notes (Signed)
Pt bib [facility] from home c/o diarrhea x2 days and "chest pressure when using bathroom". VS WDL, A&Ox4

## 2021-09-21 NOTE — Hospital Course (Addendum)
#  Abdominal pain  #Diarrhea with dark colored stools in s/o laxative overuse  #Hx of chronic constipation  #Hx of internal and external hemorrhoids On initial presentation patient complained of severe diarrhea over the last several weeks, particularly in the day prior to admission.  She did have positive FOBT on presentation as well as hemorrhoids on physical exam.  She endorsed taking MiraLAX twice daily and Linzess twice daily.  Home laxatives, Pepto-Bismol, and Xarelto were held at admission.  During the day on 01/31 she experienced significant pain and several episodes of diarrhea, one of which contained blood, prompting further evaluation with CT abdomen/pelvis on 01/31 which showed rectal wall thickening with mild perirectal fat stranding consistent with proctitis. On 02/02 patient underwent EGD which revealed normal esophagus, localized inflammation characterized by edema, erosions and erythema in gastric antrum, normal duodenum.  Patient also underwent colonoscopy which revealed normal ileum, two 3-4 mm polyps in descending and ascending colon which were removed with a cold snare, erythematous mucosa in rectum, sigmoid colon, and descending colon which were biopsied, and non-bleeding internal hemorrhoids. Hemoglobin remained stable. Patient did endorse some post-op gas pains. Clinically she was appropriate for discharge after her procedures.   #Microcytic anemia  CBC during this admission revealed microcytic anemia with MCV 79.5 attributed to be iron deficiency given ferritin of 10. Iron, TIBC, saturations, UIBC WNL; RBC decreased at 3.85 and immature reticulocyte fraction high at 22.6%. Total iron deficit calculated with Concepcion Elk of 625 mg and patient was initiated on IV iron replacement with ferric gluconate 250 mg, two doses daily.   #HTN Chronic condition.  Continued home Losartan 100 mg, diltiazem 120 mg, and spironolactone 25 mg daily.   #Hx of hypokalemia  Noted during previous  admission with outpatient initiation of potassium supplementation.  This was held during this admission given normal potassium levels.   #Hx of afib s/p ablation in NSR  Patient was in normal sinus rhythm with rate control during this admission. Continued on home diltiazem 120 mg twice daily.  Home Xarelto held.  PT/INR elevated at 23.2/2.1.   #HLD Chronic condition.  Patient denied taking prescribed Lipitor 40 mg daily.  Lipitor held during the stay.  #GERD Chronic condition.  Continued home Pepcid.   #Hx of Asthma  Chronic condition. Continued home Pulmicort, Levalbuterol, and Incurse Ellipta.   #Primary OA involving multiple joints  Chronically condition.  Continued home Norco 5-325 mg.

## 2021-09-21 NOTE — Evaluation (Addendum)
Occupational Therapy Evaluation Patient Details Name: [name] MRN: [id number] DOB: June 13, 1951 Today's Date: 09/21/2021   History of Present Illness Pt is a 71 y/o female presenting on 1/30 with diarrhea, abdoimnal pain and nausea. PMH includes: chronic constipation, HTN, afib.   Clinical Impression   PTA patient reports independent with ADLs, mobility, not driving. Admitted for above and limited by impaired balance, generalized weakness.  She requires min assist +2 safety for transfers and mobility, up to min assist for ADLs.  She relies on BUE support, therefore recommend RW (which she reports having at home) and 3:1 commode for shower chair at dc. She reports she will have intermittent support from her boyfriend at home.  Believe she will progress well, will follow acutely to optimize independence and safety but anticipate no further needs after dc home.      Recommendations for follow up therapy are one component of a multi-disciplinary discharge planning process, led by the attending physician.  Recommendations may be updated based on patient status, additional functional criteria and insurance authorization.   Follow Up Recommendations  No OT follow up    Assistance Recommended at Discharge Intermittent Supervision/Assistance  Patient can return home with the following A little help with walking and/or transfers;A little help with bathing/dressing/bathroom;Assistance with cooking/housework;Assist for transportation    Functional Status Assessment  Patient has had a recent decline in their functional status and demonstrates the ability to make significant improvements in function in a reasonable and predictable amount of time.  Equipment Recommendations  BSC/3in1    Recommendations for Other Services       Precautions / Restrictions Precautions Precautions: Fall Restrictions Weight Bearing Restrictions: No      Mobility Bed Mobility Overal bed mobility: Needs  Assistance Bed Mobility: Supine to Sit, Sit to Supine     Supine to sit: Min guard Sit to supine: Min guard   General bed mobility comments: for safety    Transfers Overall transfer level: Needs assistance Equipment used: 2 person hand held assist Transfers: Sit to/from Stand Sit to Stand: Min assist, +2 safety/equipment           General transfer comment: mild unsteadiness, preference to BUE support      Balance Overall balance assessment: Needs assistance Sitting-balance support: No upper extremity supported, Feet supported Sitting balance-Leahy Scale: Fair     Standing balance support: Bilateral upper extremity supported, During functional activity Standing balance-Leahy Scale: Poor Standing balance comment: relies on UE support                           ADL either performed or assessed with clinical judgement   ADL Overall ADL's : Needs assistance/impaired     Grooming: Set up;Sitting   Upper Body Bathing: Sitting;Set up   Lower Body Bathing: Minimal assistance;+2 for safety/equipment;Sit to/from stand   Upper Body Dressing : Set up;Sitting   Lower Body Dressing: Minimal assistance;+2 for safety/equipment;Sit to/from stand   Toilet Transfer: Minimal assistance;+2 for safety/equipment;+2 for physical assistance;Ambulation           Functional mobility during ADLs: Minimal assistance;+2 for physical assistance;+2 for safety/equipment General ADL Comments: bil hand held support, mild unsteadiness with mobility in room     Vision   Vision Assessment?: No apparent visual deficits     Perception     Praxis      Pertinent Vitals/Pain Pain Assessment Pain Assessment: 0-10 Pain Score: 7  Pain Location: abdomen Pain Descriptors /  Indicators: Discomfort Pain Intervention(s): Limited activity within patient's tolerance, Monitored during session, Repositioned     Hand Dominance     Extremity/Trunk Assessment Upper Extremity  Assessment Upper Extremity Assessment: Overall WFL for tasks assessed   Lower Extremity Assessment Lower Extremity Assessment: Defer to PT evaluation       Communication Communication Communication: No difficulties   Cognition Arousal/Alertness: Awake/alert Behavior During Therapy: WFL for tasks assessed/performed Overall Cognitive Status: Within Functional Limits for tasks assessed                                       General Comments  VSS    Exercises     Shoulder Instructions      Home Living Family/patient expects to be discharged to:: Private residence Living Arrangements: Alone Available Help at Discharge: Friend(s);Available PRN/intermittently Type of Home: Apartment Home Access: Elevator     Home Layout: One level     Bathroom Shower/Tub: Teacher, early years/pre: Standard     Home Equipment: Conservation officer, nature (2 wheels)   Additional [place] [address] [place]      Prior Functioning/Environment Prior Level of Function : Independent/Modified Independent                        OT Problem List: Decreased strength;Decreased activity tolerance;Impaired balance (sitting and/or standing);Decreased knowledge of use of DME or AE;Decreased knowledge of precautions      OT Treatment/Interventions: Self-care/ADL training;Therapeutic exercise;DME and/or AE instruction;Therapeutic activities;Balance training;Patient/family education    OT Goals(Current goals can be found in the care plan section) Acute Rehab OT Goals Patient Stated Goal: home OT Goal Formulation: With patient Time For Goal Achievement: 10/05/21 Potential to Achieve Goals: Good  OT Frequency: Min 2X/week    Co-evaluation PT/OT/SLP Co-Evaluation/Treatment: Yes Reason for Co-Treatment: For patient/therapist safety;To address functional/ADL transfers   OT goals addressed during session: ADL's and self-care      AM-PAC OT "6 Clicks" Daily Activity      Outcome Measure Help from another person eating meals?: None Help from another person taking care of personal grooming?: A Little Help from another person toileting, which includes using toliet, bedpan, or urinal?: A Little Help from another person bathing (including washing, rinsing, drying)?: A Little Help from another person to put on and taking off regular upper body clothing?: None Help from another person to put on and taking off regular lower body clothing?: A Little 6 Click Score: 20   End of Session Equipment Utilized During Treatment: Gait belt Nurse Communication: Mobility status  Activity Tolerance: Patient tolerated treatment well Patient left: in bed;with call bell/phone within reach  OT Visit Diagnosis: Other abnormalities of gait and mobility (R26.89);Muscle weakness (generalized) (M62.81)                Time: 9794-8016 OT Time Calculation (min): 16 min Charges:  OT General Charges $OT Visit: 1 Visit OT Evaluation $OT Eval Low Complexity: 1 Low  Jolaine Artist, OT Acute Rehabilitation Services Pager [phone] Office [phone]   Delight Stare 09/21/2021, 11:07 AM

## 2021-09-21 NOTE — H&P (Signed)
Date: 09/21/2021               Patient Name:  [name] MRN: [id number]  DOB: June 01, 1951 Age / Sex: 71 y.o., female   PCP: Shelby Pou, MD         Medical Service: Internal Medicine Teaching Service         Attending Physician: Dr. Heber     First Contact: Shelby Gordon, DO Pager: ED [phone]  Second Contact: Shelby Pais, MD Pager: PB [phone]       After Hours (After 5p/  First Contact Pager: [phone]  weekends / holidays): Second Contact Pager: [phone]    Chief Complaint: diarrhea, abdominal pain, nausea and diarrhea   History of Present Illness:  [name] is a 71 y.o. female with a pertinent PMH of HTN, HLD, GERD, chronic constipation, and Afib s/p ablation in NSR presenting to the ED due to diarrhea, abdominal pain and nausea.   States for the past week, but most notably for the past 2 days, has been having loose diarrhea. Passes about 4-5 bowel movements each day, but states that she has chronic constipation and takes miralax twice daily and Linzess twice daily (prescribed as as once a day). She reports dark stools for the past month which she attributes to taking pepto-bismol for the past month. Denies any NSAID use. She occasionally sees bright red blood mixed in with her stool (longstanding, hx of hemorrhoids). She has tried taking Zofran for nausea with no relief. She also reports noticing palpitations today. She is able to tolerate food and has been eating and drinking well.   She denies fevers, chills, vomiting, chest pain, SHOB, or urinary symptoms.   Limited initial ED workup significant for Hgb 10 (baseline 12-13) with positive FOBT . Exam with external and internal hemorrhoids. Labs and imaging otherwise unremarkable, and patient is hemodynamically stable.   Meds:  No current facility-administered medications on file prior to encounter.   Current Outpatient Medications on File Prior to Encounter  Medication Sig Dispense Refill   atorvastatin  (LIPITOR) 40 MG tablet Take 1 tablet (40 mg total) by mouth daily. 90 tablet 3   levalbuterol (XOPENEX HFA) 45 MCG/ACT inhaler INHALE 1 PUFF INTO THE LUNGS EVERY 6 HOURS AS NEEDED FOR SHORTNESS OF BREATH (Patient taking differently: Inhale 1 puff into the lungs every 6 (six) hours as needed for shortness of breath.) 45 g 1   linaclotide (LINZESS) 145 MCG CAPS capsule Take 1 capsule (145 mcg total) by mouth daily before breakfast. 90 capsule 3   losartan (COZAAR) 50 MG tablet Take 2 tablets (100 mg total) by mouth daily. 135 tablet 3   montelukast (SINGULAIR) 10 MG tablet Take 1 tablet (10 mg total) by mouth at bedtime. 90 tablet 3   rivaroxaban (XARELTO) 20 MG TABS tablet Take 1 tablet (20 mg total) by mouth daily with supper. 30 tablet 11   spironolactone (ALDACTONE) 25 MG tablet Take 1 tablet (25 mg total) by mouth daily. 90 tablet 3   alendronate (FOSAMAX) 70 MG tablet Take 1 tablet (70 mg total) by mouth every 7 (seven) days. Take with a full glass of water on an empty stomach. 12 tablet 3   Azelastine-Fluticasone 137-50 MCG/ACT SUSP One spray in each nostril twice a day, every day (Patient taking differently: Place 1 spray into both nostrils 2 (two) times daily as needed (rhinitis).) 23 g 5   cetirizine (ZYRTEC ALLERGY) 10 MG tablet Take 1 tablet (10 mg total)  by mouth daily as needed for allergies. 90 tablet 3   Cholecalciferol (VITAMIN D) 50 MCG (2000 UT) tablet Take 1 tablet (2,000 Units total) by mouth daily. (Patient not taking: Reported on 08/29/2021) 90 tablet 3   diltiazem (CARDIZEM) 60 MG tablet Take 1 tablet (60 mg total) by mouth every 6 (six) hours as needed (afib). 45 tablet 1   diltiazem (CARTIA XT) 120 MG 24 hr capsule Take 1 capsule (120 mg total) by mouth 2 (two) times daily. 180 capsule 2   docusate sodium (COLACE) 100 MG capsule Take 1 capsule (100 mg total) by mouth daily. 14 capsule 0   ezetimibe (ZETIA) 10 MG tablet Take 1 tablet (10 mg total) by mouth daily. 30 tablet 11    fluticasone (FLOVENT HFA) 44 MCG/ACT inhaler Inhale 1 puff into the lungs daily. 30.8 g 1   glycerin adult 2 g suppository Place 1 suppository rectally as needed for constipation. 12 suppository 0   HYDROcodone-acetaminophen (NORCO/VICODIN) 5-325 MG tablet Take 1/2 tab to 1 tab if needed for days when you are experiencing severe pain. (Patient taking differently: Take 0.5-1 tablets by mouth every 4 (four) hours as needed for severe pain.) 30 tablet 0   magnesium hydroxide (MILK OF MAGNESIA) 400 MG/5ML suspension Take 30 mLs by mouth daily as needed for moderate constipation. (Patient not taking: Reported on 06/02/2021) 360 mL 3   omeprazole (PRILOSEC) 40 MG capsule TAKE 1 CAPSULE(40 MG) BY MOUTH DAILY (Patient taking differently: Take 40 mg by mouth daily.) 90 capsule 1   ondansetron (ZOFRAN-ODT) 4 MG disintegrating tablet Take 1 tablet (4 mg total) by mouth every 8 (eight) hours as needed for nausea or vomiting. 12 tablet 0   potassium chloride SA (KLOR-CON M) 10 MEQ tablet Take 1 tablet (10 mEq total) by mouth daily. 60 tablet 0   potassium chloride SA (KLOR-CON M) 20 MEQ tablet Take 1 tablet (20 mEq total) by mouth daily. (Patient not taking: Reported on 08/29/2021) 7 tablet 0   sucralfate (CARAFATE) 1 g tablet Take 1 tablet (1 g total) by mouth 4 (four) times daily -  with meals and at bedtime. 120 tablet 0   tiotropium (SPIRIVA) 18 MCG inhalation capsule Place 1 capsule (18 mcg total) into inhaler and inhale daily. 90 capsule 3   Allergies: Allergies as of 09/21/2021 - Review Complete 09/21/2021  Allergen Reaction Noted   Albuterol Palpitations 09/20/2017   Percocet [oxycodone-acetaminophen] Shortness Of Breath, Itching, and Anxiety 12/07/2018   Celecoxib Rash 04/11/2007   Protonix [pantoprazole sodium] Palpitations 11/28/2015   Tramadol Palpitations 10/13/2020   Past Medical History:  Diagnosis Date   Asthma in adult, mild intermittent, uncomplicated    Chronic anticoagulation    Chronic  constipation    Eczema    GERD (gastroesophageal reflux disease)    History of kidney stones    Hx of vaginal bleeding, resolved, ultrasound completed 10/11/2018   Hypertension    Personal history of atrial fibrillation, s/p ablation, now in NSR but remains on anticoaulation 11/26/2015   Followed in cardiology clinic    Family History:  Family History  Problem Relation Age of Onset   Heart attack Mother        Young age   Breast cancer Sister        Late 76s; now s/p mastectomy    Lung cancer Brother        x 2   Cancer Sister    Cancer Sister    Cancer Sister  Colon cancer Neg Hx    Colon polyps Neg Hx    Esophageal cancer Neg Hx    Rectal cancer Neg Hx    Stomach cancer Neg Hx    Social History:   Lives by herself in [place] Denies alcohol, tobacco, and illicit drug use. Previous cigarette smoker; quit ~8 years ago. IADLs/ADLs- can person independently at baseline   Review of Systems: A complete ROS was negative except as per HPI.    Physical Exam: Blood pressure 120/63, pulse 74, temperature 97.6 F (36.4 C), temperature source Oral, resp. rate 19, height 5\' 7"  (1.702 m), weight 59 kg, SpO2 97 %. Constitutional: alert, well-appearing, in NAD  HENT: normocephalic, atraumatic, mucous membranes moist Eyes: conjunctiva not pale Cardiovascular: RRR, no m/r/g, trace bilateral LE Pulmonary/Chest: normal work of breathing on room air, LCTAB Abdominal: soft, mildly tender to palpation in all quadrants, non-distended MSK: normal bulk and tone Neurological: A&O x 3 Skin: warm and dry  EKG: NSR  Assessment & Plan by Problem: Principal Problem:   Diarrhea Active Problems:   Hypertension   Personal history of atrial fibrillation, now s/p ablation in NSR   GERD (gastroesophageal reflux disease)   Hyperlipidemia   Primary osteoarthritis involving multiple joints   Abdominal pain  Diarrhea with dark colored stools in s/o laxative overuse  Hx of chronic constipation  Hx  of internal and external hemorrhoids Normocytic anemia  Presenting with diarrhea and dark colored stools x several weeks with positive FOBT in setting of known hemorrhoids. Down-trending Hgb from 13 (5 mo ago) -> 11 -> 10 (today). Has a hx of chronic constipation on Mirlax and Linzess, and states to be taking double Lindzess dose d/t dose confusion (12/27 clinic note instructs 145 mcg qd). Colonoscopy in 11/2016 with 3 sessile polyps with no dysplasia or malignancy identified on pathology. DDx includes excess laxative use versus 2/2 Pepto Bismol use versus upper GI bleed given chronic down-trending Hgb with positive FOBT in setting of Xarelto use. No prior workup of normocytic anemia.  - Hold laxatives and Pepto-bismol  - Hold Xarelto  - Trend CBC   - Transfuse for Hgb <7 or if hemodynamically unstable - Consider GI consolation if Hgb down-trending or has active bleeding  - Zofran prn  - TSH  - PT/INR   HTN Taking Losartan 100 mg, diltiazem 120 mg, and spiro 25 mg  - Continue home BP medications   Hx of hypokalemia  Recent ED visit for tachycardia, n/v/d and found to be hypokalemic (2.7) without EKG changes. Has followed up in clinic on 12/27 for this. Repeat K 3.4. Not on K-wasting medications and Mag wnl. She was started on 10 mEq of potassium supplementation at that time. K is 4.0 today.  - Holding potassium supplementation  Hx of afib s/p ablation in NSR  In NSR and rate controlled. Takes diltiazem 120 mg and Xarelto 20 mg qd. Takes prn dilt for palpitations.  - Continue diltiazem  - Hold Xarelto in s/o possible GI bleed   HLD LDL 140 on 03/2021. Prescribed Lipitor 40 but denies taking.   - Holding statin   GERD - Continue Pepcid   Hx of Asthma  - Resumed home Pulmicort, Levalbuterol, and Incurse Ellipta   Primary OA involving multiple joints  Well controlled on Norco 5-325 mg  - Continue Norco    Best Practice: Diet: NPO IVF: None,None VTE: SCDs Code: Full   Shelby Manes, MD  Internal Medicine Resident, PGY-1 Pager: [phone] 7:25 AM, 09/21/2021

## 2021-09-21 NOTE — Progress Notes (Signed)
New Admission Note:   Arrival Method: Arrived from [facility] ED via stretcher Mental Orientation: Alert and oriented x4 Telemetry: Box #12-NSR Assessment: Completed Skin: Intact IV: NSL-LtAC Pain: 3/10 Tubes: N/A Safety Measures: Safety Fall Prevention Plan has been discussed.  Admission: Completed 5MW Orientation: Patient has been oriented to the room, unit and staff.  Family: None at bedside  Orders have been reviewed and implemented. Will continue to monitor the patient. Call light has been placed within reach and bed alarm has been activated.   Abrahim Sargent [employer], RN-BC Phone number: [phone]

## 2021-09-21 NOTE — Evaluation (Signed)
Physical Therapy Evaluation Patient Details Name: [name] MRN: [id number] DOB: 1951/07/22 Today's Date: 09/21/2021  History of Present Illness  Pt is a 71 y/o female presenting on 1/30 with diarrhea, abdoimnal pain and nausea. PMH includes: chronic constipation, HTN, afib.  Clinical Impression  Pt admitted secondary to problem above with deficits below. Pt with weakness in BLE and increased shakiness. Requiring min A for stability. Educated about using RW at home to increase safety. Pt reports boyfriend can help as needed. Recommending HHPT at d/c to address current deficits. Will continue to follow acutely.        Recommendations for follow up therapy are one component of a multi-disciplinary discharge planning process, led by the attending physician.  Recommendations may be updated based on patient status, additional functional criteria and insurance authorization.  Follow Up Recommendations Home health PT    Assistance Recommended at Discharge Intermittent Supervision/Assistance  Patient can return home with the following  A little help with walking and/or transfers;A little help with bathing/dressing/bathroom    Equipment Recommendations BSC/3in1  Recommendations for Other Services       Functional Status Assessment Patient has had a recent decline in their functional status and demonstrates the ability to make significant improvements in function in a reasonable and predictable amount of time.     Precautions / Restrictions Precautions Precautions: Fall Restrictions Weight Bearing Restrictions: No      Mobility  Bed Mobility Overal bed mobility: Needs Assistance Bed Mobility: Supine to Sit, Sit to Supine     Supine to sit: Min guard Sit to supine: Min guard   General bed mobility comments: for safety    Transfers Overall transfer level: Needs assistance Equipment used: 2 person hand held assist Transfers: Sit to/from Stand Sit to Stand: Min assist, +2  safety/equipment           General transfer comment: mild unsteadiness, preference to BUE support    Ambulation/Gait Ambulation/Gait assistance: Min assist, +2 safety/equipment   Assistive device: 2 person hand held assist Gait Pattern/deviations: Step-through pattern, Decreased stride length Gait velocity: Decreased     General Gait Details: took steps to/from EOB. Shakiness noted at times. Min A for steadying. Educated about using RW at home to increase safety.  Stairs            Wheelchair Mobility    Modified Rankin (Stroke Patients Only)       Balance Overall balance assessment: Needs assistance Sitting-balance support: No upper extremity supported, Feet supported Sitting balance-Leahy Scale: Fair     Standing balance support: Bilateral upper extremity supported, During functional activity Standing balance-Leahy Scale: Poor Standing balance comment: relies on UE support                             Pertinent Vitals/Pain Pain Assessment Pain Assessment: 0-10 Pain Score: 7  Pain Location: abdomen Pain Descriptors / Indicators: Discomfort Pain Intervention(s): Limited activity within patient's tolerance, Monitored during session, Repositioned    Home Living Family/patient expects to be discharged to:: Private residence Living Arrangements: Alone Available Help at Discharge: Friend(s);Available PRN/intermittently Type of Home: Apartment Home Access: Elevator       Home Layout: One level Home Equipment: Conservation officer, nature (2 wheels) Additional [place] [address] [place]    Prior Function Prior Level of Function : Independent/Modified Independent                     Hand Dominance  Extremity/Trunk Assessment   Upper Extremity Assessment Upper Extremity Assessment: Defer to OT evaluation    Lower Extremity Assessment Lower Extremity Assessment: Generalized weakness    Cervical / Trunk Assessment Cervical /  Trunk Assessment: Normal  Communication   Communication: No difficulties  Cognition Arousal/Alertness: Awake/alert Behavior During Therapy: WFL for tasks assessed/performed Overall Cognitive Status: Within Functional Limits for tasks assessed                                          General Comments General comments (skin integrity, edema, etc.): VSS throughout    Exercises     Assessment/Plan    PT Assessment Patient needs continued PT services  PT Problem List Decreased strength;Decreased activity tolerance;Decreased balance;Decreased mobility;Decreased knowledge of use of DME;Decreased knowledge of precautions       PT Treatment Interventions DME instruction;Gait training;Functional mobility training;Therapeutic exercise;Therapeutic activities;Balance training;Patient/family education    PT Goals (Current goals can be found in the Care Plan section)  Acute Rehab PT Goals Patient Stated Goal: to go home PT Goal Formulation: With patient Time For Goal Achievement: 10/05/21 Potential to Achieve Goals: Good    Frequency Min 3X/week     Co-evaluation PT/OT/SLP Co-Evaluation/Treatment: Yes Reason for Co-Treatment: For patient/therapist safety;To address functional/ADL transfers PT goals addressed during session: Mobility/safety with mobility;Balance OT goals addressed during session: ADL's and self-care       AM-PAC PT "6 Clicks" Mobility  Outcome Measure Help needed turning from your back to your side while in a flat bed without using bedrails?: A Little Help needed moving from lying on your back to sitting on the side of a flat bed without using bedrails?: A Little Help needed moving to and from a bed to a chair (including a wheelchair)?: A Little Help needed standing up from a chair using your arms (e.g., wheelchair or bedside chair)?: A Little Help needed to walk in hospital room?: A Little Help needed climbing 3-5 steps with a railing? : A Lot 6  Click Score: 17    End of Session Equipment Utilized During Treatment: Gait belt Activity Tolerance: Patient tolerated treatment well Patient left: in bed;with call bell/phone within reach (on stretcher in ED) Nurse Communication: Mobility status PT Visit Diagnosis: Unsteadiness on feet (R26.81);Muscle weakness (generalized) (M62.81)    Time: 0160-1093 PT Time Calculation (min) (ACUTE ONLY): 16 min   Charges:   PT Evaluation $PT Eval Low Complexity: 1 Low          Lou Miner, DPT  Acute Rehabilitation Services  Pager: [phone] Office: [phone]   Rudean Hitt 09/21/2021, 11:22 AM

## 2021-09-21 NOTE — Progress Notes (Signed)
HD#0 SUBJECTIVE:  Patient Summary: [name] is a 71 y.o. with a pertinent PMH of HTN, HLD, GERD, chronic constipation, and atrial fibrillation s/p ablation, who presented with diarrhea, abdominal pain, nausea, and weakness, and admitted for possible GI bleed.   Overnight Events: None  Interim History: Patient reports having an improvement in her weakness and has not had repeat diarrhea episodes since presenting to [facility] ED. She tolerated PO intake well without need to have a BM.   OBJECTIVE:  Vital Signs: Vitals:   09/21/21 0700 09/21/21 0715 09/21/21 0745 09/21/21 0800  BP: 133/88 (!) 152/59 115/78 (!) 146/78  Pulse: 84 76 74 71  Resp: 16 13 15 20   Temp:      TempSrc:      SpO2: 98% 99% 98% 99%  Weight:      Height:       Supplemental O2: Room Air SpO2: 99 %  Filed Weights   09/21/21 0230  Weight: 59 kg     Intake/Output Summary (Last 24 hours) at 09/21/2021 0933 Last data filed at 09/21/2021 3151 Gross per 24 hour  Intake 700 ml  Output --  Net 700 ml   Net IO Since Admission: 700 mL [09/21/21 0933]  Physical Exam: Constitutional: Elderly female resting comfortably in bed. No acute distress. Cardio: Regular rate and rhythm. No murmurs, rubs, gallops. Pulm: Clear to auscultation bilaterally. Normal work of breathing on room air. Abdomen: Mildly tender to palpation diffusely. Bowel sounds present all four quadrants. MSK: Negative for extremity edema. Skin: Skin is warm and dry. Neuro: Alert and oriented x3. No focal deficit noted. Psych: Normal mood and affect.  Patient Lines/Drains/Airways Status     Active Line/Drains/Airways     Name Placement date Placement time Site Days   Peripheral IV 08/29/21 22 G Right Antecubital 08/29/21  0933  Antecubital  23   Peripheral IV 09/21/21 20 G Left Antecubital 09/21/21  0220  Antecubital  less than 1   Incision - 4 Ports Abdomen 1: Medial;Upper 2: Umbilicus 3: Right;Lateral;Upper 4: Right;Lateral;Lower 12/08/18   1215  -- 1018             ASSESSMENT/PLAN:  Assessment: Principal Problem:   Diarrhea Active Problems:   Hypertension   Personal history of atrial fibrillation, now s/p ablation in NSR   GERD (gastroesophageal reflux disease)   Hyperlipidemia   Primary osteoarthritis involving multiple joints   Plan: #Abdominal pain  #Diarrhea with dark colored stools in s/o laxative overuse  #Hx of chronic constipation  #Hx of internal and external hemorrhoids #Normocytic anemia  On initial presentation patient complained of severe diarrhea over the last several weeks, particularly in the last day.  She did have positive FOBT on presentation, of note she does have hemorrhoids.  Hemoglobin has been gradually downtrending over the last several months with admission value of 10.  Patient endorses a history of chronic constipation for which she has been recently advised to take MiraLAX twice daily and Linzess twice daily.  Of note PT/INR of 23.2/2.1 on admission. -Continue holding home laxatives and Pepto-Bismol. -Continue holding Xarelto in setting of possible GI bleed -Trend CBC.  Transfuse for hemoglobin less than 7 or hemodynamic instability. -Consider consulting GI if hemoglobin continues to trend downwards or if signs of active bleed. -Zofran as needed for nausea   #HTN Chronically managed with Losartan 100 mg, diltiazem 120 mg, and spironolactone 25 mg daily. - Continue home BP medications.   #Hx of hypokalemia  Patient had  previous ED visit for tachycardia, N/V/D and was found to be hypokalemic to 2.7 at that time though there were no EKG changes noted.  Follow-up with PCP on 12/27 showed K of 3.4.  She was started on 10 mill equivalents potassium supplementation at that time.  Initial presenting potassium of 4.0. -Continue to hold potassium supplementation   #Hx of afib s/p ablation in NSR  In NSR and rate controlled. Takes diltiazem 120 mg and Xarelto 20 mg qd. Takes prn dilt for  palpitations.  - Continue diltiazem 120 mg BID, 60 mg q6h PRN RVR >120 - Continue to hold Xarelto given concern of possible GI bleed.    #HLD LDL 140 on 03/2021. Prescribed Lipitor 40 but denies taking.   - Holding statin    #GERD - Continue Pepcid    #Hx of Asthma  - Continue home Pulmicort, Levalbuterol, and Incurse Ellipta    #Primary OA involving multiple joints  Well controlled on Norco 5-325 mg  - Continue Norco   Best Practice: Diet: Regular diet IVF: Fluids: IV push only, no IV fluids VTE: SCDs Start: 09/21/21 0639 Code: Full AB: None Therapy Recs: None, DME: bedside commode and other 3 in 1 DISPO: Anticipated discharge in 1-2 days to Home pending Medical stability.  Signature: Farrel Gordon, D.O.  Internal Medicine Resident, PGY-1 [facility]  Pager: [phone] 9:33 AM, 09/21/2021   Please contact the on call pager after 5 pm and on weekends at [phone].

## 2021-09-21 NOTE — ED Provider Notes (Signed)
[facility] EMERGENCY DEPARTMENT Provider Note   CSN: [id number] Arrival date & time: 09/21/21  0210     History  Chief Complaint  Patient presents with   Diarrhea    [name] is a 71 y.o. female.  The history is provided by the patient and medical records.  Diarrhea [name] is a 71 y.o. female who presents to the Emergency Department complaining of diarrhea and abdominal pain.  She presents emergency department complaining of profuse watery diarrhea that started 2 days ago.  She states that her stools are pure water and black in color but she does take Pepto-Bismol.  She states she has associated nausea and generalized abdominal burning.  No fevers, vomiting.  She did try Zofran at home with no significant improvement in symptoms.    Home Medications Prior to Admission medications   Medication Sig Start Date End Date Taking? Authorizing Provider  atorvastatin (LIPITOR) 40 MG tablet Take 1 tablet (40 mg total) by mouth daily. 08/27/21 08/27/22 Yes Angelica Pou, MD  levalbuterol [facility] Iv HFA) 45 MCG/ACT inhaler INHALE 1 PUFF INTO THE LUNGS EVERY 6 HOURS AS NEEDED FOR SHORTNESS OF BREATH Patient taking differently: Inhale 1 puff into the lungs every 6 (six) hours as needed for shortness of breath. 09/14/20  Yes Katsadouros, Vasilios, MD  linaclotide (LINZESS) 145 MCG CAPS capsule Take 1 capsule (145 mcg total) by mouth daily before breakfast. 08/18/21  Yes Madalyn Rob, MD  losartan (COZAAR) 50 MG tablet Take 2 tablets (100 mg total) by mouth daily. 08/27/21 01/09/22 Yes Angelica Pou, MD  montelukast (SINGULAIR) 10 MG tablet Take 1 tablet (10 mg total) by mouth at bedtime. 09/19/20  Yes Angelica Pou, MD  rivaroxaban (XARELTO) 20 MG TABS tablet Take 1 tablet (20 mg total) by mouth daily with supper. 09/18/21  Yes Sherran Needs, NP  spironolactone (ALDACTONE) 25 MG tablet Take 1 tablet (25 mg total) by mouth daily. 04/17/21 04/17/22 Yes  Angelica Pou, MD  alendronate (FOSAMAX) 70 MG tablet Take 1 tablet (70 mg total) by mouth every 7 (seven) days. Take with a full glass of water on an empty stomach. 06/09/21 06/09/22  Angelica Pou, MD  Azelastine-Fluticasone [phone] MCG/ACT SUSP One spray in each nostril twice a day, every day Patient taking differently: Place 1 spray into both nostrils 2 (two) times daily as needed (rhinitis). 07/24/20   Angelica Pou, MD  cetirizine (ZYRTEC ALLERGY) 10 MG tablet Take 1 tablet (10 mg total) by mouth daily as needed for allergies. 07/31/21 07/31/22  Angelica Pou, MD  Cholecalciferol (VITAMIN D) 50 MCG (2000 UT) tablet Take 1 tablet (2,000 Units total) by mouth daily. Patient not taking: Reported on 08/29/2021 10/30/20   Angelica Pou, MD  diltiazem (CARDIZEM) 60 MG tablet Take 1 tablet (60 mg total) by mouth every 6 (six) hours as needed (afib). 08/14/21   Sherran Needs, NP  diltiazem (CARTIA XT) 120 MG 24 hr capsule Take 1 capsule (120 mg total) by mouth 2 (two) times daily. 09/18/21   Sherran Needs, NP  docusate sodium (COLACE) 100 MG capsule Take 1 capsule (100 mg total) by mouth daily. 06/15/21   Nuala Alpha A, PA-C  ezetimibe (ZETIA) 10 MG tablet Take 1 tablet (10 mg total) by mouth daily. 10/30/20 10/30/21  Angelica Pou, MD  fluticasone (FLOVENT HFA) 44 MCG/ACT inhaler Inhale 1 puff into the lungs daily. 09/08/21   Angelica Pou, MD  glycerin adult 2 g suppository Place 1 suppository rectally as needed for constipation. 08/29/21   Caccavale, Sophia, PA-C  HYDROcodone-acetaminophen (NORCO/VICODIN) 5-325 MG tablet Take 1/2 tab to 1 tab if needed for days when you are experiencing severe pain. Patient taking differently: Take 0.5-1 tablets by mouth every 4 (four) hours as needed for severe pain. 08/27/21   Angelica Pou, MD  magnesium hydroxide (MILK OF MAGNESIA) 400 MG/5ML suspension Take 30 mLs by mouth daily as needed for moderate  constipation. Patient not taking: Reported on 06/02/2021 04/17/21 04/17/22  Angelica Pou, MD  omeprazole (PRILOSEC) 40 MG capsule TAKE 1 CAPSULE(40 MG) BY MOUTH DAILY Patient taking differently: Take 40 mg by mouth daily. 04/23/21   Sid Falcon, MD  ondansetron (ZOFRAN-ODT) 4 MG disintegrating tablet Take 1 tablet (4 mg total) by mouth every 8 (eight) hours as needed for nausea or vomiting. 08/09/21   Quintella Reichert, MD  potassium chloride SA (KLOR-CON M) 10 MEQ tablet Take 1 tablet (10 mEq total) by mouth daily. 08/18/21   Madalyn Rob, MD  potassium chloride SA (KLOR-CON M) 20 MEQ tablet Take 1 tablet (20 mEq total) by mouth daily. Patient not taking: Reported on 08/29/2021 08/09/21   Quintella Reichert, MD  sucralfate (CARAFATE) 1 g tablet Take 1 tablet (1 g total) by mouth 4 (four) times daily -  with meals and at bedtime. 04/05/21   Hayden Rasmussen, MD  tiotropium (SPIRIVA) 18 MCG inhalation capsule Place 1 capsule (18 mcg total) into inhaler and inhale daily. 08/05/21   Angelica Pou, MD      Allergies    Albuterol, Percocet [oxycodone-acetaminophen], Celecoxib, Protonix [pantoprazole sodium], and Tramadol    Review of Systems   Review of Systems  Gastrointestinal:  Positive for diarrhea.  All other systems reviewed and are negative.  Physical Exam Updated Vital Signs BP 120/63    Pulse 74    Temp 97.6 F (36.4 C) (Oral)    Resp 19    Ht 5\' 7"  (1.702 m)    Wt 59 kg    SpO2 97%    BMI 20.36 kg/m  Physical Exam Vitals and nursing note reviewed.  Constitutional:      Appearance: She is well-developed.  HENT:     Head: Normocephalic and atraumatic.  Cardiovascular:     Rate and Rhythm: Normal rate and regular rhythm.  Pulmonary:     Effort: Pulmonary effort is normal. No respiratory distress.  Abdominal:     Palpations: Abdomen is soft.     Tenderness: There is no guarding or rebound.     Comments: Moderate generalized abdominal tenderness  Genitourinary:     Comments: External hemorrhoids that are noninflamed, mildly prolapsed internal hemorrhoids that are slightly inflamed.  There is mucus present on rectal exam, no gross blood or melena. Musculoskeletal:        General: No swelling or tenderness.  Skin:    General: Skin is warm and dry.  Neurological:     Mental Status: She is alert and oriented to person, place, and time.  Psychiatric:        Behavior: Behavior normal.    ED Results / Procedures / Treatments   Labs (all labs ordered are listed, but only abnormal results are displayed) Labs Reviewed  COMPREHENSIVE METABOLIC PANEL - Abnormal; Notable for the following components:      Result Value   Glucose, Bld 122 (*)    All other components within normal limits  CBC WITH  DIFFERENTIAL/PLATELET - Abnormal; Notable for the following components:   RBC 3.83 (*)    Hemoglobin 10.0 (*)    HCT 30.8 (*)    RDW 15.6 (*)    All other components within normal limits  POC OCCULT BLOOD, ED - Abnormal; Notable for the following components:   Fecal Occult Bld POSITIVE (*)    All other components within normal limits  RESP PANEL BY RT-PCR (FLU A&B, COVID) ARPGX2  LIPASE, BLOOD  PROTIME-INR  APTT  TSH    EKG EKG Interpretation  Date/Time:  Monday September 21 2021 02:29:33 EST Ventricular Rate:  72 PR Interval:  115 QRS Duration: 92 QT Interval:  410 QTC Calculation: 449 R Axis:   60 Text Interpretation: Sinus rhythm Borderline short PR interval RSR' in V1 or V2, probably normal variant Confirmed by Quintella Reichert [phone]) on 09/21/2021 4:45:41 AM  Radiology No results found.  Procedures Procedures    Medications Ordered in ED Medications  diltiazem (CARDIZEM CD) 24 hr capsule 120 mg (has no administration in time range)  losartan (COZAAR) tablet 100 mg (has no administration in time range)  spironolactone (ALDACTONE) tablet 25 mg (has no administration in time range)  umeclidinium bromide (INCRUSE ELLIPTA) 62.5 MCG/ACT 1 puff  (has no administration in time range)  levalbuterol (XOPENEX) nebulizer solution 0.63 mg (has no administration in time range)  budesonide (PULMICORT) nebulizer solution 0.25 mg (has no administration in time range)  famotidine (PEPCID) tablet 20 mg (has no administration in time range)  diltiazem (CARDIZEM) tablet 60 mg (has no administration in time range)  HYDROcodone-acetaminophen (NORCO/VICODIN) 5-325 MG per tablet 0.5-1 tablet (has no administration in time range)  sodium chloride 0.9 % bolus 500 mL (0 mLs Intravenous Stopped 09/21/21 0656)  alum & mag hydroxide-simeth (MAALOX/MYLANTA) 200-200-20 MG/5ML suspension 30 mL (30 mLs Oral Given 09/21/21 0501)    ED Course/ Medical Decision Making/ A&P                           Medical Decision Making Amount and/or Complexity of Data Reviewed Labs: ordered.  Risk OTC drugs. Decision regarding hospitalization.   Patient here for evaluation of generalized abdominal pain, black watery stools.  She does have tenderness on examination without peritoneal findings.  She has hemorrhoids on rectal examination, no evidence of overt GI bleed but she is Hemoccult positive.  CBC is significant for anemia, which has been downtrending for the last couple of months.  CMP is unremarkable.  Patient has a Protonix allergy.  Given patient's downtrending hemoglobin, reports of black stools with heme positive stool medicine consulted for observation admission.  Discussed with patient findings of studies and she is in agreement with treatment plan.        Final Clinical Impression(s) / ED Diagnoses Final diagnoses:  None    Rx / DC Orders ED Discharge Orders     None         Quintella Reichert, MD 09/21/21 [phone]

## 2021-09-22 ENCOUNTER — Observation Stay (HOSPITAL_COMMUNITY): Payer: Medicare Other

## 2021-09-22 DIAGNOSIS — J45909 Unspecified asthma, uncomplicated: Secondary | ICD-10-CM

## 2021-09-22 DIAGNOSIS — R1110 Vomiting, unspecified: Secondary | ICD-10-CM | POA: Diagnosis not present

## 2021-09-22 DIAGNOSIS — R109 Unspecified abdominal pain: Secondary | ICD-10-CM | POA: Diagnosis not present

## 2021-09-22 DIAGNOSIS — M1909 Primary osteoarthritis, other specified site: Secondary | ICD-10-CM

## 2021-09-22 DIAGNOSIS — E785 Hyperlipidemia, unspecified: Secondary | ICD-10-CM

## 2021-09-22 DIAGNOSIS — I10 Essential (primary) hypertension: Secondary | ICD-10-CM

## 2021-09-22 DIAGNOSIS — R1084 Generalized abdominal pain: Secondary | ICD-10-CM | POA: Diagnosis not present

## 2021-09-22 DIAGNOSIS — I700 Atherosclerosis of aorta: Secondary | ICD-10-CM | POA: Diagnosis not present

## 2021-09-22 DIAGNOSIS — Z8679 Personal history of other diseases of the circulatory system: Secondary | ICD-10-CM | POA: Diagnosis not present

## 2021-09-22 DIAGNOSIS — R197 Diarrhea, unspecified: Secondary | ICD-10-CM | POA: Diagnosis not present

## 2021-09-22 DIAGNOSIS — D649 Anemia, unspecified: Secondary | ICD-10-CM | POA: Diagnosis not present

## 2021-09-22 LAB — CBC
HCT: 28.4 % — ABNORMAL LOW (ref 36.0–46.0)
Hemoglobin: 9.3 g/dL — ABNORMAL LOW (ref 12.0–15.0)
MCH: 26.2 pg (ref 26.0–34.0)
MCHC: 32.7 g/dL (ref 30.0–36.0)
MCV: 80 fL (ref 80.0–100.0)
Platelets: 236 10*3/uL (ref 150–400)
RBC: 3.55 MIL/uL — ABNORMAL LOW (ref 3.87–5.11)
RDW: 15.7 % — ABNORMAL HIGH (ref 11.5–15.5)
WBC: 4.2 10*3/uL (ref 4.0–10.5)
nRBC: 0 % (ref 0.0–0.2)

## 2021-09-22 LAB — COMPREHENSIVE METABOLIC PANEL
ALT: 14 U/L (ref 0–44)
AST: 17 U/L (ref 15–41)
Albumin: 3.3 g/dL — ABNORMAL LOW (ref 3.5–5.0)
Alkaline Phosphatase: 58 U/L (ref 38–126)
Anion gap: 9 (ref 5–15)
BUN: 11 mg/dL (ref 8–23)
CO2: 23 mmol/L (ref 22–32)
Calcium: 9.1 mg/dL (ref 8.9–10.3)
Chloride: 106 mmol/L (ref 98–111)
Creatinine, Ser: 0.73 mg/dL (ref 0.44–1.00)
GFR, Estimated: >60 mL/min (ref >60)
Glucose, Bld: 88 mg/dL (ref 70–99)
Potassium: 3.7 mmol/L (ref 3.5–5.1)
Sodium: 138 mmol/L (ref 135–145)
Total Bilirubin: 0.4 mg/dL (ref 0.3–1.2)
Total Protein: 6.3 g/dL — ABNORMAL LOW (ref 6.5–8.1)

## 2021-09-22 MED ORDER — IOHEXOL 9 MG/ML PO SOLN
ORAL | Status: AC
  Administered 2021-09-22: 500 mL
  Filled 2021-09-22: qty 1000

## 2021-09-22 MED ORDER — ONDANSETRON HCL 4 MG/2ML IJ SOLN
4.0000 mg | Freq: Once | INTRAMUSCULAR | Status: AC
  Administered 2021-09-22: 4 mg via INTRAVENOUS
  Filled 2021-09-22: qty 2

## 2021-09-22 MED ORDER — ONDANSETRON HCL 4 MG/2ML IJ SOLN
4.0000 mg | Freq: Four times a day (QID) | INTRAMUSCULAR | Status: DC | PRN
  Administered 2021-09-22: 4 mg via INTRAVENOUS
  Filled 2021-09-22: qty 2

## 2021-09-22 MED ORDER — CALCIUM CARBONATE ANTACID 500 MG PO CHEW
1.0000 | CHEWABLE_TABLET | Freq: Once | ORAL | Status: AC
  Administered 2021-09-22: 200 mg via ORAL
  Filled 2021-09-22: qty 1

## 2021-09-22 MED ORDER — ACETAMINOPHEN 325 MG PO TABS
650.0000 mg | ORAL_TABLET | Freq: Four times a day (QID) | ORAL | Status: DC | PRN
  Administered 2021-09-22 – 2021-09-24 (×3): 650 mg via ORAL
  Filled 2021-09-22 (×3): qty 2

## 2021-09-22 MED ORDER — IOHEXOL 300 MG/ML  SOLN
80.0000 mL | Freq: Once | INTRAMUSCULAR | Status: AC | PRN
  Administered 2021-09-22: 80 mL via INTRAVENOUS

## 2021-09-22 NOTE — Consult Note (Addendum)
[facility] Consult: 10:57 AM 09/22/2021  LOS: 0 days    Referring Provider: Dr Heber Akiachak  Primary Care Physician:  Angelica Pou, MD Primary Gastroenterologist:  Dr. Havery Moros    Reason for Consultation: Diarrhea.  FOBT positive.  Anemia.   HPI: [name] is a 71 y.o. female.  PMH A. fib.  On Xarelto.  GERD.  Colon polyps 2018.  Lap chole 11/2018.  ERCP 11/2018 with sphincterotomy no filling defects and clean balloon sweep. 11/2016 colonoscopy with tortuous colon.  3 polyps removed from cecum, transverse and sigmoid.  Nonbleeding internal hemorrhoids.  Path: TA as well as fragments of benign colonic mucosa.  Monic pain, uses Hydrocodone periodically  At home she is on Linzess, MiraLAX for constipation, omeprazole 40 daily and Carafate for GERD Issues with ongoing constipation and abdominal pain. A CT in 08/09/2021 showed hepatic cyst, hiatal hernia, no intestinal issues, normal appendix, aortic atherosclerosis. CT of abdomen and pelvis on 08/29/2021 showed redundant colon retained stool at rectum and transverse and right colon, decompressed descending and sigmoid. Treated with GoLytely. Linzess was increased from once to twice daily in addition to MiraLAX once daily if needed  Presented to the ED yesterday with diarrhea for 2 days, chest pressure.  Since the up titration of Linzess, patient has had diarrhea 4 or 5 episodes daily, no visible blood.  Starting last week she had nausea, abdominal pain throughout the abdomen, bilious emesis.  Says she had been eating well up until the nausea and abdominal pain occurred and reports 15 pound weight loss.  Weakness, difficulty standing up and walking due to weakness.  Vital signs stable Hgb in oct to DEc 2022 ~ 11.5.  11.3 on Jan 7, 10 yest, 9.3 today.  Platelets  normal.  C-Met normal with exception of low albumin and total protein.   FOBT positive so Xarelto was held.  Multiple mucoid, soft brown stools continue.  Staff has not seen blood in the stool.  Visible friable hemorrhoids on exam. CTAP: Ordered.   Past Medical History:  Diagnosis Date   Asthma in adult, mild intermittent, uncomplicated    Chronic anticoagulation    Chronic constipation    Eczema    GERD (gastroesophageal reflux disease)    History of kidney stones    Hx of vaginal bleeding, resolved, ultrasound completed 10/11/2018   Hypertension    Personal history of atrial fibrillation, s/p ablation, now in NSR but remains on anticoaulation 11/26/2015   Followed in cardiology clinic     Past Surgical History:  Procedure Laterality Date   ATRIAL FIBRILLATION ABLATION N/A 09/30/2017   Procedure: ATRIAL FIBRILLATION ABLATION;  Surgeon: Thompson Grayer, MD;  Location: [facility];  Service: Cardiovascular;  Laterality: N/A;   CHOLECYSTECTOMY N/A 12/08/2018   Procedure: LAPAROSCOPIC CHOLECYSTECTOMY WITH INTRAOPERATIVE CHOLANGIOGRAM;  Surgeon: Jovita Kussmaul, MD;  Location: [facility];  Service: General;  Laterality: N/A;   ERCP N/A 12/09/2018   Procedure: ENDOSCOPIC RETROGRADE CHOLANGIOPANCREATOGRAPHY (ERCP);  Surgeon: Carol Ada, MD;  Location: [facility];  Service: Endoscopy;  Laterality: N/A;  SPHINCTEROTOMY  12/09/2018   Procedure: SPHINCTEROTOMY;  Surgeon: Carol Ada, MD;  Location: [facility];  Service: Endoscopy;;   TONSILLECTOMY     TUBAL LIGATION      Prior to Admission medications   Medication Sig Start Date End Date Taking? Authorizing Provider  alendronate (FOSAMAX) 70 MG tablet Take 1 tablet (70 mg total) by mouth every 7 (seven) days. Take with a full glass of water on an empty stomach. 06/09/21 06/09/22 Yes Angelica Pou, MD  atorvastatin (LIPITOR) 40 MG tablet Take 1 tablet (40 mg total) by mouth daily. 08/27/21 08/27/22 Yes Angelica Pou, MD   levalbuterol [facility] HFA) 45 MCG/ACT inhaler INHALE 1 PUFF INTO THE LUNGS EVERY 6 HOURS AS NEEDED FOR SHORTNESS OF BREATH Patient taking differently: Inhale 1 puff into the lungs every 6 (six) hours as needed for shortness of breath. 09/14/20  Yes Katsadouros, Vasilios, MD  linaclotide (LINZESS) 145 MCG CAPS capsule Take 1 capsule (145 mcg total) by mouth daily before breakfast. 08/18/21  Yes Madalyn Rob, MD  losartan (COZAAR) 50 MG tablet Take 2 tablets (100 mg total) by mouth daily. 08/27/21 01/09/22 Yes Angelica Pou, MD  montelukast (SINGULAIR) 10 MG tablet Take 1 tablet (10 mg total) by mouth at bedtime. 09/19/20  Yes Angelica Pou, MD  rivaroxaban (XARELTO) 20 MG TABS tablet Take 1 tablet (20 mg total) by mouth daily with supper. 09/18/21  Yes Sherran Needs, NP  spironolactone (ALDACTONE) 25 MG tablet Take 1 tablet (25 mg total) by mouth daily. 04/17/21 04/17/22 Yes Angelica Pou, MD  Azelastine-Fluticasone [phone] MCG/ACT SUSP One spray in each nostril twice a day, every day Patient taking differently: Place 1 spray into both nostrils 2 (two) times daily as needed (rhinitis). 07/24/20   Angelica Pou, MD  cetirizine (ZYRTEC ALLERGY) 10 MG tablet Take 1 tablet (10 mg total) by mouth daily as needed for allergies. 07/31/21 07/31/22  Angelica Pou, MD  Cholecalciferol (VITAMIN D) 50 MCG (2000 UT) tablet Take 1 tablet (2,000 Units total) by mouth daily. Patient not taking: Reported on 08/29/2021 10/30/20   Angelica Pou, MD  diltiazem (CARDIZEM) 60 MG tablet Take 1 tablet (60 mg total) by mouth every 6 (six) hours as needed (afib). 08/14/21   Sherran Needs, NP  diltiazem (CARTIA XT) 120 MG 24 hr capsule Take 1 capsule (120 mg total) by mouth 2 (two) times daily. 09/18/21   Sherran Needs, NP  docusate sodium (COLACE) 100 MG capsule Take 1 capsule (100 mg total) by mouth daily. 06/15/21   Nuala Alpha A, PA-C  ezetimibe (ZETIA) 10 MG tablet Take 1 tablet (10  mg total) by mouth daily. 10/30/20 10/30/21  Angelica Pou, MD  fluticasone (FLOVENT HFA) 44 MCG/ACT inhaler Inhale 1 puff into the lungs daily. 09/08/21   Angelica Pou, MD  glycerin adult 2 g suppository Place 1 suppository rectally as needed for constipation. 08/29/21   Caccavale, Sophia, PA-C  HYDROcodone-acetaminophen (NORCO/VICODIN) 5-325 MG tablet Take 1/2 tab to 1 tab if needed for days when you are experiencing severe pain. Patient taking differently: Take 0.5-1 tablets by mouth every 4 (four) hours as needed for severe pain. 08/27/21   Angelica Pou, MD  magnesium hydroxide (MILK OF MAGNESIA) 400 MG/5ML suspension Take 30 mLs by mouth daily as needed for moderate constipation. Patient not taking: Reported on 06/02/2021 04/17/21 04/17/22  Angelica Pou, MD  omeprazole (PRILOSEC) 40 MG capsule TAKE 1 CAPSULE(40 MG) BY MOUTH  DAILY Patient taking differently: Take 40 mg by mouth daily. 04/23/21   Sid Falcon, MD  ondansetron (ZOFRAN-ODT) 4 MG disintegrating tablet Take 1 tablet (4 mg total) by mouth every 8 (eight) hours as needed for nausea or vomiting. 08/09/21   Quintella Reichert, MD  potassium chloride SA (KLOR-CON M) 10 MEQ tablet Take 1 tablet (10 mEq total) by mouth daily. 08/18/21   Madalyn Rob, MD  sucralfate (CARAFATE) 1 g tablet Take 1 tablet (1 g total) by mouth 4 (four) times daily -  with meals and at bedtime. 04/05/21   Hayden Rasmussen, MD  tiotropium (SPIRIVA) 18 MCG inhalation capsule Place 1 capsule (18 mcg total) into inhaler and inhale daily. 08/05/21   Angelica Pou, MD    Scheduled Meds:  budesonide  0.25 mg Inhalation BID   diltiazem  120 mg Oral BID   famotidine  20 mg Oral Daily   losartan  100 mg Oral Daily   spironolactone  25 mg Oral Daily   umeclidinium bromide  1 puff Inhalation Daily   Infusions:  PRN Meds: diltiazem, HYDROcodone-acetaminophen, levalbuterol, ondansetron (ZOFRAN) IV   Allergies as of 09/21/2021 - Review  Complete 09/21/2021  Allergen Reaction Noted   Albuterol Palpitations 09/20/2017   Percocet [oxycodone-acetaminophen] Shortness Of Breath, Itching, and Anxiety 12/07/2018   Celecoxib Rash 04/11/2007   Protonix [pantoprazole sodium] Palpitations 11/28/2015   Tramadol Palpitations 10/13/2020    Family History  Problem Relation Age of Onset   Heart attack Mother        Young age   Breast cancer Sister        Late 2s; now s/p mastectomy    Lung cancer Brother        x 2   Cancer Sister    Cancer Sister    Cancer Sister    Colon cancer Neg Hx    Colon polyps Neg Hx    Esophageal cancer Neg Hx    Rectal cancer Neg Hx    Stomach cancer Neg Hx     Social History   Socioeconomic History   Marital status: Single    Spouse name: Not on file   Number of children: Not on file   Years of education: Not on file   Highest education level: Not on file  Occupational History   Occupation: Retired  Tobacco Use   Smoking status: Former    Types: Cigarettes    Quit date: 10/24/2015    Years since quitting: 5.9   Smokeless tobacco: Never  Vaping Use   Vaping Use: Never used  Substance and Sexual Activity   Alcohol use: Not Currently   Drug use: Not Currently    Types: Marijuana    Comment: Smoked marijuana in the past.   Sexual activity: Yes    Partners: Male    Birth control/protection: Post-menopausal  Other Topics Concern   Not on file  Social History Narrative   Lives alone in [place], [place] and has 2 daughters who are within driving distance   Enjoys watching soap operas, reading, exercise   Social Determinants of Health   Financial Resource Strain: Not on file  Food Insecurity: Not on file  Transportation Needs: Not on file  Physical Activity: Not on file  Stress: Not on file  Social Connections: Not on file  Intimate Partner Violence: Not on file    REVIEW OF SYSTEMS: Constitutional: Weakness ENT:  No nose bleeds Pulm: No shortness of breath or cough. CV:  No  palpitations, no LE edema.  No angina GU:  No hematuria, no frequency GI: See HPI Heme: Denies unusual or excessive bleeding or bruising Transfusions: None Neuro:  No headaches, no peripheral tingling or numbness.  Some dizziness.  No syncope, no seizure Derm:  No itching, no rash or sores.  Endocrine:  No sweats or chills.  No polyuria or dysuria Immunization: Reviewed Travel:  Not queried   PHYSICAL EXAM: Vital signs in last 24 hours: Vitals:   09/22/21 0504 09/22/21 0841  BP: 136/67 136/71  Pulse: 75 93  Resp: 18 17  Temp: 98 F (36.7 C) 98.4 F (36.9 C)  SpO2: 100% 100%   Wt Readings from Last 3 Encounters:  09/21/21 60.2 kg  08/29/21 65.8 kg  08/18/21 62.3 kg    General: Patient is ill-appearing, uncomfortable, some mild distress. Head: No asymmetry or swelling Eyes: No scleral icterus.  No conjunctival pallor. Ears: Not hard of hearing Nose: No congestion or discharge Mouth: Mucosa is moist, pink, clear.  Tongue midline Neck: No JVD Lungs: Poor inspiratory effort but lungs are clear bilaterally. Heart: NSR in 80s. Abdomen: Nondistended, soft.  Diffusely tender without guarding or rebound.  Bowel sounds active.  No high-pitched or tympanitic sounds..   Rectal: Visible friable tissue at rectum, no active bleeding.  Liquid light brown stool on exam glove without visible blood.  This is similar to stool that is sitting in the bedside commode. Musc/Skeltl: Limbs are thin.  No obvious joint deformities Extremities: No CCE Neurologic: Oriented x3.  Moves all 4 limbs, strength not tested.  No tremor Skin: No rash, no sores, no telangiectasia   Psych: Depressed affect  Intake/Output from previous day: No intake/output data recorded. Intake/Output this shift: No intake/output data recorded.  LAB RESULTS: Recent Labs    09/21/21 0415 09/22/21 0339  WBC 5.5 4.2  HGB 10.0* 9.3*  HCT 30.8* 28.4*  PLT 280 236   BMET Lab Results  Component Value Date   NA 138  09/22/2021   NA 140 09/21/2021   NA 137 08/29/2021   K 3.7 09/22/2021   K 4.0 09/21/2021   K 3.4 (L) 08/29/2021   CL 106 09/22/2021   CL 106 09/21/2021   CL 105 08/29/2021   CO2 23 09/22/2021   CO2 25 09/21/2021   CO2 27 08/29/2021   GLUCOSE 88 09/22/2021   GLUCOSE 122 (H) 09/21/2021   GLUCOSE 111 (H) 08/29/2021   BUN 11 09/22/2021   BUN 15 09/21/2021   BUN 8 08/29/2021   CREATININE 0.73 09/22/2021   CREATININE 0.86 09/21/2021   CREATININE 0.74 08/29/2021   CALCIUM 9.1 09/22/2021   CALCIUM 9.8 09/21/2021   CALCIUM 10.4 (H) 08/29/2021   LFT Recent Labs    09/21/21 0415 09/22/21 0339  PROT 6.8 6.3*  ALBUMIN 3.7 3.3*  AST 21 17  ALT 17 14  ALKPHOS 56 58  BILITOT 0.4 0.4   PT/INR Lab Results  Component Value Date   INR 2.1 (H) 09/21/2021   INR 1.0 12/08/2018   INR 1.03 10/06/2016   Hepatitis Panel No results for input(s): HEPBSAG, HCVAB, HEPAIGM, HEPBIGM in the last 72 hours. C-Diff No components found for: CDIFF Lipase     Component Value Date/Time   LIPASE 30 09/21/2021 0415    Drugs of Abuse     Component Value Date/Time   LABOPIA NONE DETECTED 11/26/2015 0841   COCAINSCRNUR NONE DETECTED 11/26/2015 0841   COCAINSCRNUR NEG 03/11/2010 2205   LABBENZ NONE DETECTED 11/26/2015 0841  LABBENZ NEG 03/11/2010 2205   AMPHETMU NONE DETECTED 11/26/2015 0841   THCU POSITIVE (A) 11/26/2015 0841   LABBARB NONE DETECTED 11/26/2015 0841     RADIOLOGY STUDIES: No results found.    IMPRESSION:   Diarrhea, abdominal pain, FOBT positive stool without gross bleeding inpatient with history of constipation.  Linzess in use for 18 months with dose increase 3 weeks ago from once to twice daily resulting in resenting symptoms of diarrhea, abdominal pain, nausea/vomiting.  I think the FOBT positive is from the hemorrhoids.  Anemia.  Suspect the drop in Hb reflects equilibration rather than significant blood loss.  Chronic Xarelto, on hold.  History A.  fib.  Adenomatous colon polyps, most recent colonoscopy was 11/2016.  She is due for surveillance study.    PLAN:     Although she has had recent CT scans, ordered another CT scan to rule out ischemic colitis.  Probably should pursue colonoscopy but not clear she will be able to tolerate prep right now.  Hopefully within the next day or 2 she will be at her able to tolerate prep.  Continue to hold the Xarelto.  Although unlikely this represents C. difficile, will order stool studies for rule out.  Change to clear liquid diet   Azucena Freed  09/22/2021, 10:57 AM Phone [phone]  Addendum 1624: CT shows proctitis.

## 2021-09-22 NOTE — Progress Notes (Addendum)
HD#0 SUBJECTIVE:  Patient Summary: [name] is a 71 y.o. with a pertinent PMH of HTN, HLD, GERD, chronic constipation, and atrial fibrillation s/p ablation, who presented with diarrhea, abdominal pain, nausea, and weakness, and admitted for possible GI bleed.   Overnight Events: Patient was nauseated with episode of vomiting overnight. Vomit was green.  Interim History: Patient is in obvious discomfort on exam.  She states that over the course of the day yesterday she began to feel better though at around 3 AM she felt nauseous with a lot of gas.  At that time she vomited and had multiple bowel movements that she reports were dark-colored and associated with severe abdominal pain.  She has since felt severely weak and generally unwell.  She has had several bowel movements thus far today as well.  OBJECTIVE:  Vital Signs: Vitals:   09/21/21 2100 09/21/21 2114 09/22/21 0119 09/22/21 0504  BP: 105/73 132/71 99/61 136/67  Pulse:  72 70 75  Resp: 14 18 18 18   Temp:  97.9 F (36.6 C) 98.4 F (36.9 C) 98 F (36.7 C)  TempSrc:  Oral Oral Oral  SpO2:  99% 100% 100%  Weight:  60.2 kg    Height:       Supplemental O2: Room Air SpO2: 100 %  Filed Weights   09/21/21 0230 09/21/21 2114  Weight: 59 kg 60.2 kg     Intake/Output Summary (Last 24 hours) at 09/22/2021 2094 Last data filed at 09/21/2021 7096 Gross per 24 hour  Intake 500 ml  Output --  Net 500 ml   Net IO Since Admission: 700 mL [09/22/21 0608]  Physical Exam: Constitutional: Elderly female curled in bed with obvious pain. Cardio: Regular rate and rhythm.  No murmurs, rubs, gallops. Pulm: Normal work of breathing on room air. Abdomen: Diffusely tender to palpation with most significant pain in right lower quadrant.  Guarding noted. GU: External hemorrhoids noted. MSK: Negative for extremity edema. Skin: Skin is warm and dry. Neuro: Alert and oriented x3.  No focal deficit noted. Psych: Tearful mood and  affect.  Chaperone present during examination  Patient Lines/Drains/Airways Status     Active Line/Drains/Airways     Name Placement date Placement time Site Days   Peripheral IV 09/21/21 20 G Left Antecubital 09/21/21  0220  Antecubital  1   Incision - 4 Ports Abdomen 1: Medial;Upper 2: Umbilicus 3: Right;Lateral;Upper 4: Right;Lateral;Lower 12/08/18  1215  -- 1019             ASSESSMENT/PLAN:  Assessment: Principal Problem:   Diarrhea Active Problems:   Hypertension   Personal history of atrial fibrillation, now s/p ablation in NSR   GERD (gastroesophageal reflux disease)   Hyperlipidemia   Primary osteoarthritis involving multiple joints   Plan: #Abdominal pain  #Diarrhea with dark colored stools in s/o laxative overuse  #Hx of chronic constipation  #Hx of internal and external hemorrhoids #Normocytic anemia  Patient endorses severe abdominal pain with associated nausea and vomiting and persistent diarrhea that began around 3:00 this morning.  She is tearful and on physical exam abdomen is guarded and diffusely tender to palpation.  External hemorrhoids noted as well.  She reports tolerating p.o. intake well 01/30.  Hemoglobin did decrease further to 9.3 from 10.0 on admission labs.  Given apparent clinical deterioration GI has been consulted. -GI consulted, appreciate their assistance  -CT abdomen/pelvis ordered  -Patient likely needs a colonoscopy though with current nausea is unlikely to tolerate bowel prep;  hopeful that in the next few days she will be able to tolerate bowel prep to pursue colonoscopy  -Stool studies ordered  -Clear liquid diet -Continue holding home laxatives and Pepto-Bismol. -Continue holding Xarelto in setting of possible GI bleed -Trend CBC.  Transfuse for hemoglobin less than 7 or hemodynamic instability. -Zofran as needed for nausea   #HTN Chronically managed with Losartan 100 mg, diltiazem 120 mg, and spironolactone 25 mg daily. -  Continue home BP medications.   #Hx of hypokalemia  Previously started on potassium supplementation after being found to be hypokalemic at previous hospital admission.  Potassium has been stable during this admission. -Continue to hold potassium supplementation   #Hx of afib s/p ablation in NSR  #CHA2DS2-VASc score 3 In NSR and rate controlled. Takes diltiazem 120 mg and Xarelto 20 mg qd. Takes prn dilt for palpitations.  - Continue diltiazem 120 mg BID, 60 mg q6h PRN RVR >120 - Continue to hold Xarelto given concern of possible GI bleed.    #HLD LDL 140 on 03/2021. Prescribed Lipitor 40 but denies taking.   - Holding statin    #GERD - Continue Pepcid    #Hx of Asthma  - Continue home Pulmicort, Levalbuterol, and Incurse Ellipta    #Primary OA involving multiple joints  Well controlled on Norco 5-325 mg  - Continue Norco   Best Practice: Diet: Clear liquid diet IVF: Fluids: IV push only, no IV fluids VTE: SCDs Start: 09/21/21 0639 Code: Full AB: None Therapy Recs: None, DME: bedside commode and other 3 in 1 DISPO: Anticipated discharge pending Medical stability.  Signature: Farrel Gordon, D.O.  Internal Medicine Resident, PGY-1 [facility]  Pager: [phone] 6:08 AM, 09/22/2021   Please contact the on call pager after 5 pm and on weekends at [phone].

## 2021-09-22 NOTE — TOC Progression Note (Signed)
Transition of Care [facility]) - Progression Note    Patient Details  Name: [name] MRN: [id number] Date of Birth: 09-04-1950  Transition of Care [facility]) CM/SW Contact  Tom-Johnson, Renea Ee, RN Phone Number: 09/22/2021, 4:27 PM  Clinical Narrative:    Home health PT referral made with [facility] Lemmings and Tommi Rumps [phone]) voiced acceptance. BSC ordered from [facility] [phone]) to deliver at bedside. CM will continue to follow with needs.   Expected Discharge Plan: [place] Barriers to Discharge: Continued Medical Work up  Expected Discharge Plan and Services Expected Discharge Plan: [place] arrangements for the past 2 months: Apartment                                       Social Determinants of Health (SDOH) Interventions    Readmission Risk Interventions No flowsheet data found.

## 2021-09-22 NOTE — Progress Notes (Signed)
OT Cancellation Note  Patient Details Name: [name] MRN: [id number] DOB: 1951/03/03   Cancelled Treatment:    Reason Eval/Treat Not Completed: Pain limiting ability to participate;Other (comment) (nursing aware)  Joeseph Amor OTR/L  Acute Rehab Services  [phone] office number [phone] pager number  Joeseph Amor 09/22/2021, 10:15 AM

## 2021-09-22 NOTE — Progress Notes (Signed)
°   09/22/21 1415  Clinical Encounter Type  Visited With Patient  Visit Type Initial;Spiritual support  Consult/Referral To None   Chaplain visited with patient while on 52M rounding. The patient, [name], was in good spirits in spite of the discomfort she has been through. She is optimistic about the tests that she is about to undergo. Patient shared her story and her trust in God through it all. [name] was appreciative of my visit as I said goodbye.   Roslyn Resident [facility] [phone]

## 2021-09-22 NOTE — Care Management Obs Status (Signed)
[facility] NOTIFICATION   Patient Details  Name: [name] MRN: [id number] Date of Birth: 05/29/1951   Medicare Observation Status Notification Given:  Yes    Tom-Johnson, Renea Ee, RN 09/22/2021, 3:30 PM

## 2021-09-22 NOTE — TOC Initial Note (Signed)
Transition of Care [facility]) - Initial/Assessment Note    Patient Details  Name: [name] MRN: [id number] Date of Birth: 04-27-1951  Transition of Care [facility]) CM/SW Contact:    Milinda Antis, [place] Phone Number: 09/22/2021, 4:11 PM  Clinical Narrative:                 CSW met with the patient at bedside to initiate d/c planning.  The patient is from home alone.  She has a friend who she has known for 13 years who lives "2 doors up" from the patient and assist with transportation and any other needs.  The patient's PCP is at [facility].  The patient is understanding of PT's recommendation for home health and is willing to accept.  The patient does not have an agency preference for home health or DME.  The patient reports having a walker at home, but will need the 3n1.  CSW verified the patient's address and phone number.  RNCM notified and will secure home health and DME.    Expected Discharge Plan: [place] Barriers to Discharge: Continued Medical Work up   Patient Goals and CMS Choice Patient states their goals for this hospitalization and ongoing recovery are:: To return home CMS Medicare.gov Compare Post Acute Care list provided to:: Patient Choice offered to / list presented to : Patient  Expected Discharge Plan and Services Expected Discharge Plan: [place]       Living arrangements for the past 2 months: Apartment                                      Prior Living Arrangements/Services Living arrangements for the past 2 months: Apartment Lives with:: Self Patient language and need for interpreter reviewed:: Yes Do you feel safe going back to the place where you live?: Yes      Need for Family Participation in Patient Care: Yes (Comment) Care giver support system in place?: Yes (comment)   Criminal Activity/Legal Involvement Pertinent to Current Situation/Hospitalization: No - Comment as needed  Activities of  Daily Living Home Assistive Devices/Equipment: None ADL Screening (condition at time of admission) Patient's cognitive ability adequate to safely complete daily activities?: Yes Is the patient deaf or have difficulty hearing?: No Does the patient have difficulty seeing, even when wearing glasses/contacts?: No  Permission Sought/Granted Permission sought to share information with : Other (comment) (Education officer, museum) Permission granted to share information with : Yes, Verbal Permission Granted     Permission granted to share info w AGENCY: home health and DME agencies        Emotional Assessment Appearance:: Appears stated age Attitude/Demeanor/Rapport: Gracious Affect (typically observed): Accepting Orientation: : Oriented to Self, Oriented to Place, Oriented to  Time, Oriented to Situation Alcohol / Substance Use: Not Applicable Psych Involvement: No (comment)  Admission diagnosis:  Diarrhea [R19.7] Patient Active Problem List   Diagnosis Date Noted   Diarrhea 09/21/2021   H/O hypokalemia 10/29/2020   Abdominal aortic atherosclerosis ([place]) 10/22/2020   Seasonal and perennial allergic (presumed) rhinitis 07/25/2020   Asthma in adult, mild intermittent, uncomplicated    Chronic recurrent bilateral lower abdominal pain 07/24/2020   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 ([place]) 07/23/2020   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 02/14/2020   Primary osteoarthritis involving multiple joints 06/14/2018   Osteoporosis without current pathological fracture 04/15/2017  Constipation, chronic 10/19/2016   Bilateral hearing loss due to cerumen impaction 09/10/2016   Hyperlipidemia 03/18/2016   GERD (gastroesophageal reflux disease)    Personal history of atrial fibrillation, now s/p ablation in NSR 11/26/2015   Marijuana abuse 06/11/2015   Hypertension    PCP:  Angelica Pou, MD Pharmacy:   [facility] [place], [place] [address] [place] [address] Phone: [phone] Fax: [phone]     Social Determinants of Health (SDOH) Interventions    Readmission Risk Interventions No flowsheet data found.

## 2021-09-23 DIAGNOSIS — J4520 Mild intermittent asthma, uncomplicated: Secondary | ICD-10-CM | POA: Diagnosis not present

## 2021-09-23 DIAGNOSIS — K529 Noninfective gastroenteritis and colitis, unspecified: Secondary | ICD-10-CM | POA: Diagnosis not present

## 2021-09-23 DIAGNOSIS — Z9049 Acquired absence of other specified parts of digestive tract: Secondary | ICD-10-CM | POA: Diagnosis not present

## 2021-09-23 DIAGNOSIS — K219 Gastro-esophageal reflux disease without esophagitis: Secondary | ICD-10-CM | POA: Diagnosis not present

## 2021-09-23 DIAGNOSIS — Z8679 Personal history of other diseases of the circulatory system: Secondary | ICD-10-CM | POA: Diagnosis not present

## 2021-09-23 DIAGNOSIS — Z7901 Long term (current) use of anticoagulants: Secondary | ICD-10-CM | POA: Diagnosis not present

## 2021-09-23 DIAGNOSIS — Z888 Allergy status to other drugs, medicaments and biological substances status: Secondary | ICD-10-CM | POA: Diagnosis not present

## 2021-09-23 DIAGNOSIS — Z20822 Contact with and (suspected) exposure to covid-19: Secondary | ICD-10-CM | POA: Diagnosis not present

## 2021-09-23 DIAGNOSIS — B9681 Helicobacter pylori [H. pylori] as the cause of diseases classified elsewhere: Secondary | ICD-10-CM | POA: Diagnosis not present

## 2021-09-23 DIAGNOSIS — D509 Iron deficiency anemia, unspecified: Secondary | ICD-10-CM | POA: Diagnosis not present

## 2021-09-23 DIAGNOSIS — R1084 Generalized abdominal pain: Secondary | ICD-10-CM | POA: Diagnosis not present

## 2021-09-23 DIAGNOSIS — K6389 Other specified diseases of intestine: Secondary | ICD-10-CM | POA: Diagnosis not present

## 2021-09-23 DIAGNOSIS — K635 Polyp of colon: Secondary | ICD-10-CM | POA: Diagnosis not present

## 2021-09-23 DIAGNOSIS — K2950 Unspecified chronic gastritis without bleeding: Secondary | ICD-10-CM | POA: Diagnosis not present

## 2021-09-23 DIAGNOSIS — K6289 Other specified diseases of anus and rectum: Secondary | ICD-10-CM | POA: Diagnosis not present

## 2021-09-23 DIAGNOSIS — D649 Anemia, unspecified: Secondary | ICD-10-CM | POA: Diagnosis not present

## 2021-09-23 DIAGNOSIS — R197 Diarrhea, unspecified: Secondary | ICD-10-CM | POA: Diagnosis not present

## 2021-09-23 DIAGNOSIS — Z8249 Family history of ischemic heart disease and other diseases of the circulatory system: Secondary | ICD-10-CM | POA: Diagnosis not present

## 2021-09-23 DIAGNOSIS — I10 Essential (primary) hypertension: Secondary | ICD-10-CM | POA: Diagnosis not present

## 2021-09-23 DIAGNOSIS — Z9851 Tubal ligation status: Secondary | ICD-10-CM | POA: Diagnosis not present

## 2021-09-23 DIAGNOSIS — E785 Hyperlipidemia, unspecified: Secondary | ICD-10-CM | POA: Diagnosis present

## 2021-09-23 DIAGNOSIS — Z87442 Personal history of urinary calculi: Secondary | ICD-10-CM | POA: Diagnosis not present

## 2021-09-23 DIAGNOSIS — K5909 Other constipation: Secondary | ICD-10-CM | POA: Diagnosis not present

## 2021-09-23 DIAGNOSIS — Z885 Allergy status to narcotic agent status: Secondary | ICD-10-CM | POA: Diagnosis not present

## 2021-09-23 DIAGNOSIS — E876 Hypokalemia: Secondary | ICD-10-CM | POA: Diagnosis not present

## 2021-09-23 DIAGNOSIS — Z7983 Long term (current) use of bisphosphonates: Secondary | ICD-10-CM | POA: Diagnosis not present

## 2021-09-23 DIAGNOSIS — K648 Other hemorrhoids: Secondary | ICD-10-CM | POA: Diagnosis not present

## 2021-09-23 DIAGNOSIS — D500 Iron deficiency anemia secondary to blood loss (chronic): Secondary | ICD-10-CM | POA: Diagnosis not present

## 2021-09-23 DIAGNOSIS — Z79899 Other long term (current) drug therapy: Secondary | ICD-10-CM | POA: Diagnosis not present

## 2021-09-23 DIAGNOSIS — Z87891 Personal history of nicotine dependence: Secondary | ICD-10-CM | POA: Diagnosis not present

## 2021-09-23 DIAGNOSIS — K644 Residual hemorrhoidal skin tags: Secondary | ICD-10-CM | POA: Diagnosis present

## 2021-09-23 DIAGNOSIS — A048 Other specified bacterial intestinal infections: Secondary | ICD-10-CM | POA: Diagnosis not present

## 2021-09-23 DIAGNOSIS — K2970 Gastritis, unspecified, without bleeding: Secondary | ICD-10-CM | POA: Diagnosis not present

## 2021-09-23 DIAGNOSIS — M159 Polyosteoarthritis, unspecified: Secondary | ICD-10-CM | POA: Diagnosis not present

## 2021-09-23 LAB — BASIC METABOLIC PANEL
Anion gap: 9 (ref 5–15)
BUN: 9 mg/dL (ref 8–23)
CO2: 24 mmol/L (ref 22–32)
Calcium: 9.8 mg/dL (ref 8.9–10.3)
Chloride: 104 mmol/L (ref 98–111)
Creatinine, Ser: 0.92 mg/dL (ref 0.44–1.00)
GFR, Estimated: >60 mL/min (ref >60)
Glucose, Bld: 99 mg/dL (ref 70–99)
Potassium: 3.5 mmol/L (ref 3.5–5.1)
Sodium: 137 mmol/L (ref 135–145)

## 2021-09-23 LAB — CBC
HCT: 31 % — ABNORMAL LOW (ref 36.0–46.0)
Hemoglobin: 10.1 g/dL — ABNORMAL LOW (ref 12.0–15.0)
MCH: 25.9 pg — ABNORMAL LOW (ref 26.0–34.0)
MCHC: 32.6 g/dL (ref 30.0–36.0)
MCV: 79.5 fL — ABNORMAL LOW (ref 80.0–100.0)
Platelets: 262 10*3/uL (ref 150–400)
RBC: 3.9 MIL/uL (ref 3.87–5.11)
RDW: 15.9 % — ABNORMAL HIGH (ref 11.5–15.5)
WBC: 6.5 10*3/uL (ref 4.0–10.5)
nRBC: 0 % (ref 0.0–0.2)

## 2021-09-23 LAB — IRON AND TIBC
Iron: 62 ug/dL (ref 28–170)
Saturation Ratios: 16 % (ref 10.4–31.8)
TIBC: 395 ug/dL (ref 250–450)
UIBC: 333 ug/dL

## 2021-09-23 LAB — RETICULOCYTES
Immature Retic Fract: 22.6 % — ABNORMAL HIGH (ref 2.3–15.9)
RBC.: 3.85 MIL/uL — ABNORMAL LOW (ref 3.87–5.11)
Retic Count, Absolute: 68.5 10*3/uL (ref 19.0–186.0)
Retic Ct Pct: 1.8 % (ref 0.4–3.1)

## 2021-09-23 LAB — FERRITIN: Ferritin: 10 ng/mL — ABNORMAL LOW (ref 11–307)

## 2021-09-23 MED ORDER — PEG-KCL-NACL-NASULF-NA ASC-C 100 G PO SOLR: 1.0000 | Freq: Once | ORAL | Status: DC

## 2021-09-23 MED ORDER — PEG-KCL-NACL-NASULF-NA ASC-C 100 G PO SOLR
0.5000 | Freq: Once | ORAL | Status: AC
  Administered 2021-09-23: 100 g via ORAL
  Filled 2021-09-23: qty 1

## 2021-09-23 MED ORDER — SODIUM CHLORIDE 0.9 % IV SOLN
250.0000 mg | Freq: Every day | INTRAVENOUS | Status: AC
  Administered 2021-09-23 – 2021-09-24 (×2): 250 mg via INTRAVENOUS
  Filled 2021-09-23 (×2): qty 20

## 2021-09-23 MED ORDER — METOCLOPRAMIDE HCL 5 MG/ML IJ SOLN
10.0000 mg | Freq: Four times a day (QID) | INTRAMUSCULAR | Status: AC
  Administered 2021-09-23 (×2): 10 mg via INTRAVENOUS
  Filled 2021-09-23 (×2): qty 2

## 2021-09-23 MED ORDER — HYDROCORTISONE 1 % EX OINT
TOPICAL_OINTMENT | Freq: Three times a day (TID) | CUTANEOUS | Status: DC
  Filled 2021-09-23 (×3): qty 28

## 2021-09-23 NOTE — Plan of Care (Signed)
  Problem: Education: Goal: Knowledge of disease or condition will improve Outcome: Progressing   

## 2021-09-23 NOTE — H&P (View-Only) (Signed)
Daily Rounding Note  09/23/2021, 11:39 AM  LOS: 0 days   SUBJECTIVE:   Chief complaint:    diarrhea, anemia  Last BM was last night, not as liquid.  Feels a lot better.  Rectum sore.    OBJECTIVE:         Vital signs in last 24 hours:    Temp:  [98.4 F (36.9 C)-99.2 F (37.3 C)] 98.7 F (37.1 C) (02/01 0900) Pulse Rate:  [84-96] 84 (02/01 0900) Resp:  [17-19] 18 (02/01 0900) BP: (102-156)/(65-95) 102/65 (02/01 0900) SpO2:  [94 %-100 %] 98 % (02/01 0900) Last BM Date: 09/22/21 Filed Weights   09/21/21 0230 09/21/21 2114  Weight: 59 kg 60.2 kg   General: looks thin.  100% plus improvement in overall appearance.     Heart: RRR Chest: clear Abdomen: soft, BS active.  ND.  Minor non focal tenderness  Extremities: no CCE Neuro/Psych:  oriented x 3.  Bright affect.  No tremors or gross deficits.    Intake/Output from previous day: 01/31 0701 - 02/01 0700 In: 300 [P.O.:300] Out: 451 [Urine:450; Stool:1]  Intake/Output this shift: Total I/O In: 400 [P.O.:400] Out: 250 [Urine:250]  Lab Results: Recent Labs    09/21/21 0415 09/22/21 0339 09/23/21 0634  WBC 5.5 4.2 6.5  HGB 10.0* 9.3* 10.1*  HCT 30.8* 28.4* 31.0*  PLT 280 236 262   BMET Recent Labs    09/21/21 0415 09/22/21 0339 09/23/21 0634  NA 140 138 137  K 4.0 3.7 3.5  CL 106 106 104  CO2 25 23 24   GLUCOSE 122* 88 99  BUN 15 11 9   CREATININE 0.86 0.73 0.92  CALCIUM 9.8 9.1 9.8   LFT Recent Labs    09/21/21 0415 09/22/21 0339  PROT 6.8 6.3*  ALBUMIN 3.7 3.3*  AST 21 17  ALT 17 14  ALKPHOS 56 58  BILITOT 0.4 0.4   PT/INR Recent Labs    09/21/21 0658  LABPROT 23.2*  INR 2.1*   Hepatitis Panel No results for input(s): HEPBSAG, HCVAB, HEPAIGM, HEPBIGM in the last 72 hours.  Studies/Results: CT ABDOMEN PELVIS W CONTRAST  Result Date: 09/22/2021 CLINICAL DATA:  Severe abdominal pain, nausea and vomiting, diarrhea EXAM: CT  ABDOMEN AND PELVIS WITH CONTRAST TECHNIQUE: Multidetector CT imaging of the abdomen and pelvis was performed using the standard protocol following bolus administration of intravenous contrast. RADIATION DOSE REDUCTION: This exam was performed according to the departmental dose-optimization program which includes automated exposure control, adjustment of the mA and/or kV according to patient size and/or use of iterative reconstruction technique. CONTRAST:  75mL OMNIPAQUE IOHEXOL 300 MG/ML  SOLN COMPARISON:  08/29/2021 FINDINGS: Lower chest: No acute pleural or parenchymal lung disease. Hepatobiliary: Stable hepatic cysts. Stable transient hepatic attenuation difference inferior right lobe liver, normalizing on delayed images and stable since prior study. No other focal liver abnormalities. No intrahepatic duct dilation. Gallbladder is surgically absent. Pancreas: Unremarkable. No pancreatic ductal dilatation or surrounding inflammatory changes. Spleen: Normal in size without focal abnormality. Adrenals/Urinary Tract: Adrenal glands are unremarkable. Kidneys are normal, without renal calculi, focal lesion, or hydronephrosis. Bladder is decompressed, limiting its evaluation. Stomach/Bowel: No bowel obstruction or ileus. Normal appendix right lower quadrant. There is moderate rectal wall thickening which may reflect proctitis. Mild wall thickening elsewhere throughout the transverse and descending colon likely due to decompressed state. Vascular/Lymphatic: Aortic atherosclerosis. No enlarged abdominal or pelvic lymph nodes. Reproductive: Uterus and bilateral adnexa are unremarkable. Other:  No free fluid or free intraperitoneal gas. Mild perirectal fat stranding. No abdominal wall hernia. Musculoskeletal: No acute or destructive bony lesions. Reconstructed images demonstrate no additional findings. IMPRESSION: 1. Rectal wall thickening and mild perirectal fat stranding, consistent with proctitis. 2. Otherwise no acute  intra-abdominal or intrapelvic process. 3.  Aortic Atherosclerosis (ICD10-I70.0). Electronically Signed   By: Randa Ngo M.D.   On: 09/22/2021 15:45    Scheduled Meds:  budesonide  0.25 mg Inhalation BID   diltiazem  120 mg Oral BID   famotidine  20 mg Oral Daily   losartan  100 mg Oral Daily   spironolactone  25 mg Oral Daily   umeclidinium bromide  1 puff Inhalation Daily   Continuous Infusions: PRN Meds:.acetaminophen, diltiazem, HYDROcodone-acetaminophen, levalbuterol, ondansetron (ZOFRAN) IV   ASSESMENT:     Diarrhea a/w doubling dose Linzess a month ago.  Background of chronic constipation.  Proctitis on CT    FOBT + but no overt bleeding or blood on DRE.  Friable rectal tissue on DRE.      Normocytic anemia    Chronic Xarelto for Afib.  On hold    PLAN     Colonoscopy tmrw.  See prep orders.  Dc c diff testing and contact precaution orders.  HC topical cream to rectum ordered.      Azucena Freed  09/23/2021, 11:39 AM Phone [phone]

## 2021-09-23 NOTE — Progress Notes (Signed)
Occupational Therapy Treatment Patient Details Name: [name] MRN: [id number] DOB: 06/06/1951 Today's Date: 09/23/2021   History of present illness Pt is a 71 y/o female presenting on 1/30 with diarrhea, abdoimnal pain and nausea. PMH includes: chronic constipation, HTN, afib.   OT comments  Pt making good progress with OT goals this session. Pt able to complete functional mobility and BADL's at a supervision to min guard level at this time, using no DME. Pt continues to have mild decrease in activity tolerance, however it is improving along with her strength. OT will continue to follow acutely.    Recommendations for follow up therapy are one component of a multi-disciplinary discharge planning process, led by the attending physician.  Recommendations may be updated based on patient status, additional functional criteria and insurance authorization.    Follow Up Recommendations  No OT follow up    Assistance Recommended at Discharge Set up Supervision/Assistance  Patient can return home with the following  A little help with walking and/or transfers;A little help with bathing/dressing/bathroom;Assistance with cooking/housework;Assist for transportation   Equipment Recommendations  BSC/3in1    Recommendations for Other Services      Precautions / Restrictions Precautions Precautions: Fall Restrictions Weight Bearing Restrictions: No       Mobility Bed Mobility Overal bed mobility: Modified Independent             General bed mobility comments: No assist neede    Transfers Overall transfer level: Needs assistance Equipment used: None Transfers: Sit to/from Stand Sit to Stand: Min guard           General transfer comment: No physical assist needed at this time     Balance Overall balance assessment: Mild deficits observed, not formally tested                                         ADL either performed or assessed with clinical  judgement   ADL Overall ADL's : Needs assistance/impaired                                       General ADL Comments: Pt much improved this session, requiring supervision to min guard overall for safety wtih all ADL's and mobility    Extremity/Trunk Assessment              Vision       Perception     Praxis      Cognition Arousal/Alertness: Awake/alert Behavior During Therapy: WFL for tasks assessed/performed Overall Cognitive Status: Within Functional Limits for tasks assessed                                          Exercises      Shoulder Instructions       General Comments VSS on Ra    Pertinent Vitals/ Pain       Pain Assessment Pain Assessment: No/denies pain  Home Living                                          Prior Functioning/Environment  Frequency  Min 2X/week        Progress Toward Goals  OT Goals(current goals can now be found in the care plan section)  Progress towards OT goals: Progressing toward goals  Acute Rehab OT Goals Patient Stated Goal: to go home OT Goal Formulation: With patient Time For Goal Achievement: 10/05/21 Potential to Achieve Goals: Good ADL Goals Pt Will Perform Grooming: with modified independence;standing Pt Will Perform Lower Body Dressing: with modified independence;sit to/from stand Pt Will Transfer to Toilet: with modified independence;bedside commode;ambulating Pt Will Perform Tub/Shower Transfer: Tub transfer;with modified independence;3 in 1;ambulating;rolling walker  Plan Discharge plan remains appropriate;Frequency remains appropriate    Co-evaluation                 AM-PAC OT "6 Clicks" Daily Activity     Outcome Measure   Help from another person eating meals?: None Help from another person taking care of personal grooming?: A Little Help from another person toileting, which includes using toliet, bedpan, or urinal?:  A Little Help from another person bathing (including washing, rinsing, drying)?: A Little Help from another person to put on and taking off regular upper body clothing?: None Help from another person to put on and taking off regular lower body clothing?: A Little 6 Click Score: 20    End of Session Equipment Utilized During Treatment: Gait belt  OT Visit Diagnosis: Other abnormalities of gait and mobility (R26.89);Muscle weakness (generalized) (M62.81)   Activity Tolerance Patient tolerated treatment well   Patient Left in bed;with call bell/phone within reach   Nurse Communication Mobility status        Time: 4098-1191 OT Time Calculation (min): 13 min  Charges: OT General Charges $OT Visit: 1 Visit OT Treatments $Therapeutic Activity: 8-22 mins  Zuriel Yeaman H., OTR/L Acute Rehabilitation  Myquan [place] Elane Bing Plume 09/23/2021, 6:47 PM

## 2021-09-23 NOTE — Progress Notes (Signed)
HD#1 SUBJECTIVE:  Patient Summary: [name] is a 71 y.o. with a pertinent PMH of HTN, HLD, GERD, chronic constipation, and atrial fibrillation s/p ablation, who presented with diarrhea, abdominal pain, nausea, and weakness, and admitted for possible GI bleed.   Overnight Events: None  Interim History: Patient reports feeling significantly better this morning. Her only complaint at this time is pain rectal pain from hemorrhoids for which she wants hemorrhoid cream.  OBJECTIVE:  Vital Signs: Vitals:   09/22/21 2047 09/22/21 2057 09/23/21 0828 09/23/21 0900  BP: 111/75   102/65  Pulse: 94  90 84  Resp: 19  18 18   Temp: 99.2 F (37.3 C)   98.7 F (37.1 C)  TempSrc: Oral   Oral  SpO2: 97% 94%  98%  Weight:      Height:       Supplemental O2: Room Air SpO2: 98 %  Filed Weights   09/21/21 0230 09/21/21 2114  Weight: 59 kg 60.2 kg     Intake/Output Summary (Last 24 hours) at 09/23/2021 1303 Last data filed at 09/23/2021 1000 Gross per 24 hour  Intake 700 ml  Output 701 ml  Net -1 ml    Net IO Since Admission: 699 mL [09/23/21 1303]  Physical Exam: Constitutional: Elderly female resting in bed in no acute distress. Cardio: Regular rate and rhythm.  No murmurs, rubs, gallops. Pulm: Normal work of breathing on room air. Abdomen: Diffuse mild tenderness without guarding. MSK: Negative for extremity edema. Skin: Skin is warm and dry. Neuro: Alert and oriented x3.  No focal deficit noted. Psych: Normal mood and affect.   Patient Lines/Drains/Airways Status     Active Line/Drains/Airways     Name Placement date Placement time Site Days   Peripheral IV 09/21/21 20 G Left Antecubital 09/21/21  0220  Antecubital  1   Incision - 4 Ports Abdomen 1: Medial;Upper 2: Umbilicus 3: Right;Lateral;Upper 4: Right;Lateral;Lower 12/08/18  1215  -- 1019             ASSESSMENT/PLAN:  Assessment: Principal Problem:   Diarrhea Active Problems:   Hypertension   Personal  history of atrial fibrillation, now s/p ablation in NSR   GERD (gastroesophageal reflux disease)   Hyperlipidemia   Primary osteoarthritis involving multiple joints   Plan: #Abdominal pain  #Diarrhea with dark colored stools in s/o laxative overuse  #Hx of chronic constipation  #Hx of internal and external hemorrhoids Patient reports significant improvement in symptoms overnight with no new vomiting or excess diarrhea. Hgb is stable at this time. CT abdomen/pelvis 01/31 showed rectal wall thickening with mild perirectal fat stranding consistent with proctitis.  -GI consulted, appreciate their assistance  -Bowel prep for colonoscopy 02/02  -Clear liquid diet, NPO at 5am 02/02 -Continue holding home laxatives and Pepto-Bismol. -Continue holding Xarelto in setting of possible GI bleed. SCDs ordered. -Trend CBC.  Transfuse for hemoglobin less than 7 or hemodynamic instability. -Zofran as needed for nausea  #Microcytic anemia  CBC reveals microcytic anemia with MCV 79.5, cause appears to be iron deficiency given ferritin 10. Iron, TIBC, saturations, UIBC WNL; RBC decreased at 3.85 and immature reticulocyte fraction high at 22.6%. Total iron deficit calculated with Ganzoni Equation of 625 mg. -Given presenting symptoms of GI upset will opt for IV iron replacement with ferric gluconate 250 mg, two doses daily   #HTN Chronically managed with Losartan 100 mg, diltiazem 120 mg, and spironolactone 25 mg daily. - Continue home BP medications.   #Hx of hypokalemia  Previously started on potassium supplementation after being found to be hypokalemic at previous hospital admission.  Potassium has been stable during this admission. -Continue to hold potassium supplementation   #Hx of afib s/p ablation in NSR  #CHA2DS2-VASc score 3 In NSR and rate controlled. Takes diltiazem 120 mg and Xarelto 20 mg qd. Takes prn dilt for palpitations.  - Continue diltiazem 120 mg BID, 60 mg q6h PRN RVR >120 -  Continue to hold Xarelto given concern of possible GI bleed.    #HLD LDL 140 on 03/2021. Prescribed Lipitor 40 but denies taking.   - Holding statin    #GERD - Continue Pepcid    #Hx of Asthma  - Continue home Pulmicort, Levalbuterol, and Incurse Ellipta    #Primary OA involving multiple joints  Well controlled on Norco 5-325 mg  - Continue Norco   Best Practice: Diet: Clear liquid diet, NPO at 0500 02/02 IVF: Fluids: IV push only, no IV fluids VTE: SCDs Start: 09/21/21 0639 Code: Full AB: None Therapy Recs: None, DME: bedside commode and other 3 in 1 DISPO: Anticipated discharge pending Medical stability.  Signature: Farrel Gordon, D.O.  Internal Medicine Resident, PGY-1 [facility]  Pager: [phone] 1:03 PM, 09/23/2021   Please contact the on call pager after 5 pm and on weekends at [phone].

## 2021-09-23 NOTE — Progress Notes (Signed)
°   09/23/21 1615  Clinical Encounter Type  Visited With Patient  Visit Type Follow-up   Chaplain followed up on patient because of the immense pain she was in prior. Patient is in good spirits and shared her accomplishments today.  She is looking forward to a meal this evening and more testing tomorrow. I advised the patient that if she wanted to see a chaplain tomorrow to let her nurse know.   [place] Resident [facility] [phone]

## 2021-09-23 NOTE — Progress Notes (Signed)
Daily Rounding Note  09/23/2021, 11:39 AM  LOS: 0 days   SUBJECTIVE:   Chief complaint:    diarrhea, anemia  Last BM was last night, not as liquid.  Feels a lot better.  Rectum sore.    OBJECTIVE:         Vital signs in last 24 hours:    Temp:  [98.4 F (36.9 C)-99.2 F (37.3 C)] 98.7 F (37.1 C) (02/01 0900) Pulse Rate:  [84-96] 84 (02/01 0900) Resp:  [17-19] 18 (02/01 0900) BP: (102-156)/(65-95) 102/65 (02/01 0900) SpO2:  [94 %-100 %] 98 % (02/01 0900) Last BM Date: 09/22/21 Filed Weights   09/21/21 0230 09/21/21 2114  Weight: 59 kg 60.2 kg   General: looks thin.  100% plus improvement in overall appearance.     Heart: RRR Chest: clear Abdomen: soft, BS active.  ND.  Minor non focal tenderness  Extremities: no CCE Neuro/Psych:  oriented x 3.  Bright affect.  No tremors or gross deficits.    Intake/Output from previous day: 01/31 0701 - 02/01 0700 In: 300 [P.O.:300] Out: 451 [Urine:450; Stool:1]  Intake/Output this shift: Total I/O In: 400 [P.O.:400] Out: 250 [Urine:250]  Lab Results: Recent Labs    09/21/21 0415 09/22/21 0339 09/23/21 0634  WBC 5.5 4.2 6.5  HGB 10.0* 9.3* 10.1*  HCT 30.8* 28.4* 31.0*  PLT 280 236 262   BMET Recent Labs    09/21/21 0415 09/22/21 0339 09/23/21 0634  NA 140 138 137  K 4.0 3.7 3.5  CL 106 106 104  CO2 25 23 24   GLUCOSE 122* 88 99  BUN 15 11 9   CREATININE 0.86 0.73 0.92  CALCIUM 9.8 9.1 9.8   LFT Recent Labs    09/21/21 0415 09/22/21 0339  PROT 6.8 6.3*  ALBUMIN 3.7 3.3*  AST 21 17  ALT 17 14  ALKPHOS 56 58  BILITOT 0.4 0.4   PT/INR Recent Labs    09/21/21 0658  LABPROT 23.2*  INR 2.1*   Hepatitis Panel No results for input(s): HEPBSAG, HCVAB, HEPAIGM, HEPBIGM in the last 72 hours.  Studies/Results: CT ABDOMEN PELVIS W CONTRAST  Result Date: 09/22/2021 CLINICAL DATA:  Severe abdominal pain, nausea and vomiting, diarrhea EXAM: CT  ABDOMEN AND PELVIS WITH CONTRAST TECHNIQUE: Multidetector CT imaging of the abdomen and pelvis was performed using the standard protocol following bolus administration of intravenous contrast. RADIATION DOSE REDUCTION: This exam was performed according to the departmental dose-optimization program which includes automated exposure control, adjustment of the mA and/or kV according to patient size and/or use of iterative reconstruction technique. CONTRAST:  78mL OMNIPAQUE IOHEXOL 300 MG/ML  SOLN COMPARISON:  08/29/2021 FINDINGS: Lower chest: No acute pleural or parenchymal lung disease. Hepatobiliary: Stable hepatic cysts. Stable transient hepatic attenuation difference inferior right lobe liver, normalizing on delayed images and stable since prior study. No other focal liver abnormalities. No intrahepatic duct dilation. Gallbladder is surgically absent. Pancreas: Unremarkable. No pancreatic ductal dilatation or surrounding inflammatory changes. Spleen: Normal in size without focal abnormality. Adrenals/Urinary Tract: Adrenal glands are unremarkable. Kidneys are normal, without renal calculi, focal lesion, or hydronephrosis. Bladder is decompressed, limiting its evaluation. Stomach/Bowel: No bowel obstruction or ileus. Normal appendix right lower quadrant. There is moderate rectal wall thickening which may reflect proctitis. Mild wall thickening elsewhere throughout the transverse and descending colon likely due to decompressed state. Vascular/Lymphatic: Aortic atherosclerosis. No enlarged abdominal or pelvic lymph nodes. Reproductive: Uterus and bilateral adnexa are unremarkable. Other:  No free fluid or free intraperitoneal gas. Mild perirectal fat stranding. No abdominal wall hernia. Musculoskeletal: No acute or destructive bony lesions. Reconstructed images demonstrate no additional findings. IMPRESSION: 1. Rectal wall thickening and mild perirectal fat stranding, consistent with proctitis. 2. Otherwise no acute  intra-abdominal or intrapelvic process. 3.  Aortic Atherosclerosis (ICD10-I70.0). Electronically Signed   By: Randa Ngo M.D.   On: 09/22/2021 15:45    Scheduled Meds:  budesonide  0.25 mg Inhalation BID   diltiazem  120 mg Oral BID   famotidine  20 mg Oral Daily   losartan  100 mg Oral Daily   spironolactone  25 mg Oral Daily   umeclidinium bromide  1 puff Inhalation Daily   Continuous Infusions: PRN Meds:.acetaminophen, diltiazem, HYDROcodone-acetaminophen, levalbuterol, ondansetron (ZOFRAN) IV   ASSESMENT:     Diarrhea a/w doubling dose Linzess a month ago.  Background of chronic constipation.  Proctitis on CT    FOBT + but no overt bleeding or blood on DRE.  Friable rectal tissue on DRE.      Normocytic anemia    Chronic Xarelto for Afib.  On hold    PLAN     Colonoscopy tmrw.  See prep orders.  Dc c diff testing and contact precaution orders.  HC topical cream to rectum ordered.      Azucena Freed  09/23/2021, 11:39 AM Phone [phone]

## 2021-09-23 NOTE — Plan of Care (Signed)
  Problem: Education: Goal: Knowledge of disease or condition will improve Outcome: Progressing   Problem: Activity: Goal: Ability to tolerate increased activity will improve Outcome: Progressing   

## 2021-09-23 NOTE — TOC Progression Note (Signed)
Transition of Care [facility]) - Progression Note    Patient Details  Name: [name] MRN: [id number] Date of Birth: 09-06-1950  Transition of Care [facility]) CM/SW Contact  Tom-Johnson, Renea Ee, RN Phone Number: 09/23/2021, 2:43 PM  Clinical Narrative:    CM notified by charge nurse that patient is requesting a rolling walker for gait instability.Order called in to [facility] to deliver at bedside. CM will continue to follow with needs.   Expected Discharge Plan: [place] Barriers to Discharge: Continued Medical Work up  Expected Discharge Plan and Services Expected Discharge Plan: [place] arrangements for the past 2 months: Apartment                                       Social Determinants of Health (SDOH) Interventions    Readmission Risk Interventions No flowsheet data found.

## 2021-09-24 ENCOUNTER — Inpatient Hospital Stay (HOSPITAL_COMMUNITY): Payer: Medicare Other | Admitting: Anesthesiology

## 2021-09-24 ENCOUNTER — Encounter (HOSPITAL_COMMUNITY): Payer: Self-pay | Admitting: Internal Medicine

## 2021-09-24 ENCOUNTER — Encounter (HOSPITAL_COMMUNITY)
Admission: EM | Disposition: A | Discharge status: Home Health | Payer: Self-pay | Source: Home / Self Care | Attending: Internal Medicine

## 2021-09-24 DIAGNOSIS — K635 Polyp of colon: Secondary | ICD-10-CM

## 2021-09-24 DIAGNOSIS — Z860100 Personal history of colon polyps, unspecified: Secondary | ICD-10-CM

## 2021-09-24 DIAGNOSIS — K2970 Gastritis, unspecified, without bleeding: Secondary | ICD-10-CM

## 2021-09-24 DIAGNOSIS — A048 Other specified bacterial intestinal infections: Secondary | ICD-10-CM | POA: Insufficient documentation

## 2021-09-24 DIAGNOSIS — D500 Iron deficiency anemia secondary to blood loss (chronic): Secondary | ICD-10-CM

## 2021-09-24 DIAGNOSIS — K6289 Other specified diseases of anus and rectum: Secondary | ICD-10-CM

## 2021-09-24 HISTORY — PX: POLYPECTOMY: SHX5525

## 2021-09-24 HISTORY — PX: ESOPHAGOGASTRODUODENOSCOPY (EGD) WITH PROPOFOL: SHX5813

## 2021-09-24 HISTORY — DX: Other specified bacterial intestinal infections: A04.8

## 2021-09-24 HISTORY — PX: BIOPSY: SHX5522

## 2021-09-24 HISTORY — PX: COLONOSCOPY WITH PROPOFOL: SHX5780

## 2021-09-24 LAB — BASIC METABOLIC PANEL
Anion gap: 12 (ref 5–15)
BUN: 14 mg/dL (ref 8–23)
CO2: 21 mmol/L — ABNORMAL LOW (ref 22–32)
Calcium: 9.4 mg/dL (ref 8.9–10.3)
Chloride: 108 mmol/L (ref 98–111)
Creatinine, Ser: 1.13 mg/dL — ABNORMAL HIGH (ref 0.44–1.00)
GFR, Estimated: 52 mL/min — ABNORMAL LOW (ref >60)
Glucose, Bld: 114 mg/dL — ABNORMAL HIGH (ref 70–99)
Potassium: 3.7 mmol/L (ref 3.5–5.1)
Sodium: 141 mmol/L (ref 135–145)

## 2021-09-24 LAB — CBC
HCT: 31.8 % — ABNORMAL LOW (ref 36.0–46.0)
Hemoglobin: 10.7 g/dL — ABNORMAL LOW (ref 12.0–15.0)
MCH: 26.5 pg (ref 26.0–34.0)
MCHC: 33.6 g/dL (ref 30.0–36.0)
MCV: 78.7 fL — ABNORMAL LOW (ref 80.0–100.0)
Platelets: 280 10*3/uL (ref 150–400)
RBC: 4.04 MIL/uL (ref 3.87–5.11)
RDW: 15.7 % — ABNORMAL HIGH (ref 11.5–15.5)
WBC: 10 10*3/uL (ref 4.0–10.5)
nRBC: 0 % (ref 0.0–0.2)

## 2021-09-24 SURGERY — COLONOSCOPY WITH PROPOFOL
Anesthesia: Monitor Anesthesia Care

## 2021-09-24 MED ORDER — OMEPRAZOLE 40 MG PO CPDR: 40.0000 mg | DELAYED_RELEASE_CAPSULE | Freq: Two times a day (BID) | ORAL | 1 refills | Status: DC

## 2021-09-24 MED ORDER — ONDANSETRON HCL 4 MG/2ML IJ SOLN: 4.0000 mg | Freq: Once | INTRAMUSCULAR | Status: DC | PRN

## 2021-09-24 MED ORDER — FENTANYL CITRATE (PF) 100 MCG/2ML IJ SOLN
INTRAMUSCULAR | Status: AC
  Filled 2021-09-24: qty 2

## 2021-09-24 MED ORDER — PROPOFOL 500 MG/50ML IV EMUL
INTRAVENOUS | Status: DC | PRN
  Administered 2021-09-24: 150 ug/kg/min via INTRAVENOUS

## 2021-09-24 MED ORDER — SIMETHICONE 80 MG PO CHEW: 80.0000 mg | CHEWABLE_TABLET | Freq: Four times a day (QID) | ORAL | 0 refills | Status: DC | PRN

## 2021-09-24 MED ORDER — SIMETHICONE 80 MG PO CHEW
80.0000 mg | CHEWABLE_TABLET | Freq: Four times a day (QID) | ORAL | Status: DC | PRN
  Administered 2021-09-24: 80 mg via ORAL
  Filled 2021-09-24 (×2): qty 1

## 2021-09-24 MED ORDER — HYDROCORTISONE 1 % EX OINT: TOPICAL_OINTMENT | Freq: Three times a day (TID) | CUTANEOUS | 0 refills | Status: DC

## 2021-09-24 MED ORDER — CALCIUM CARBONATE ANTACID 500 MG PO CHEW
1.0000 | CHEWABLE_TABLET | Freq: Two times a day (BID) | ORAL | Status: DC | PRN
  Administered 2021-09-24: 200 mg via ORAL
  Filled 2021-09-24: qty 1

## 2021-09-24 MED ORDER — FENTANYL CITRATE (PF) 100 MCG/2ML IJ SOLN
25.0000 ug | Freq: Once | INTRAMUSCULAR | Status: AC
  Administered 2021-09-24: 25 ug via INTRAVENOUS

## 2021-09-24 MED ORDER — LACTATED RINGERS IV SOLN: INTRAVENOUS | Status: DC | PRN

## 2021-09-24 MED ORDER — PROPOFOL 10 MG/ML IV BOLUS
INTRAVENOUS | Status: DC | PRN
Start: 2021-09-24 — End: 2021-09-24
  Administered 2021-09-24 (×2): 20 mg via INTRAVENOUS

## 2021-09-24 MED ORDER — AMISULPRIDE (ANTIEMETIC) 5 MG/2ML IV SOLN: 10.0000 mg | Freq: Once | INTRAVENOUS | Status: DC | PRN

## 2021-09-24 MED ORDER — BUTAMBEN-TETRACAINE-BENZOCAINE 2-2-14 % EX AERO
INHALATION_SPRAY | CUTANEOUS | Status: DC | PRN
  Administered 2021-09-24: 1 via TOPICAL

## 2021-09-24 MED ORDER — LIDOCAINE 2% (20 MG/ML) 5 ML SYRINGE
INTRAMUSCULAR | Status: DC | PRN
  Administered 2021-09-24: 20 mg via INTRAVENOUS

## 2021-09-24 SURGICAL SUPPLY — 22 items
ELECT REM PT RETURN 9FT ADLT (ELECTROSURGICAL)
ELECTRODE REM PT RTRN 9FT ADLT (ELECTROSURGICAL) IMPLANT
FCP BXJMBJMB 240X2.8X (CUTTING FORCEPS)
FLOOR PAD 36X40 (MISCELLANEOUS) ×3
FORCEPS BIOP RAD 4 LRG CAP 4 (CUTTING FORCEPS) IMPLANT
FORCEPS BIOP RJ4 240 W/NDL (CUTTING FORCEPS)
FORCEPS BXJMBJMB 240X2.8X (CUTTING FORCEPS) IMPLANT
INJECTOR/SNARE I SNARE (MISCELLANEOUS) IMPLANT
LUBRICANT JELLY 4.5OZ STERILE (MISCELLANEOUS) IMPLANT
MANIFOLD NEPTUNE II (INSTRUMENTS) IMPLANT
NDL SCLEROTHERAPY 25GX240 (NEEDLE) IMPLANT
NEEDLE SCLEROTHERAPY 25GX240 (NEEDLE) IMPLANT
PAD FLOOR 36X40 (MISCELLANEOUS) ×3 IMPLANT
PROBE APC STR FIRE (PROBE) IMPLANT
PROBE INJECTION GOLD (MISCELLANEOUS)
PROBE INJECTION GOLD 7FR (MISCELLANEOUS) IMPLANT
SNARE ROTATE MED OVAL 20MM (MISCELLANEOUS) IMPLANT
SYR 50ML LL SCALE MARK (SYRINGE) IMPLANT
TRAP SPECIMEN MUCOUS 40CC (MISCELLANEOUS) IMPLANT
TUBING ENDO SMARTCAP PENTAX (MISCELLANEOUS) IMPLANT
TUBING IRRIGATION ENDOGATOR (MISCELLANEOUS) ×4 IMPLANT
WATER STERILE IRR 1000ML POUR (IV SOLUTION) IMPLANT

## 2021-09-24 NOTE — Anesthesia Preprocedure Evaluation (Addendum)
Anesthesia Evaluation  Patient identified by MRN, date of birth, ID band Patient awake    Reviewed: Allergy & Precautions, NPO status , Patient's Chart, lab work & pertinent test results  Airway Mallampati: I  TM Distance: >3 FB Neck ROM: Full    Dental  (+) Missing,    Pulmonary asthma , former smoker,    breath sounds clear to auscultation       Cardiovascular hypertension, Pt. on medications  Rhythm:Regular Rate:Normal  Echo: Left ventricle: The cavity size was normal. Systolic function was  normal. The estimated ejection fraction was in the range of 55%  to 60%. Wall motion was normal; there were no regional wall  motion abnormalities. Left ventricular diastolic function  parameters were normal.    Neuro/Psych negative neurological ROS  negative psych ROS   GI/Hepatic Neg liver ROS, GERD  Medicated and Controlled,  Endo/Other  negative endocrine ROS  Renal/GU negative Renal ROS     Musculoskeletal  (+) Arthritis ,   Abdominal Normal abdominal exam  (+)   Peds  Hematology negative hematology ROS (+)   Anesthesia Other Findings   Reproductive/Obstetrics                            Anesthesia Physical Anesthesia Plan  ASA: 3  Anesthesia Plan: MAC   Post-op Pain Management:    Induction: Intravenous  PONV Risk Score and Plan: 0 and Propofol infusion  Airway Management Planned: Natural Airway and Simple Face Mask  Additional Equipment: None  Intra-op Plan:   Post-operative Plan:   Informed Consent: I have reviewed the patients History and Physical, chart, labs and discussed the procedure including the risks, benefits and alternatives for the proposed anesthesia with the patient or authorized representative who has indicated his/her understanding and acceptance.       Plan Discussed with: CRNA  Anesthesia Plan Comments:        Anesthesia Quick  Evaluation

## 2021-09-24 NOTE — Discharge Summary (Signed)
Name: [name] MRN: [id number] DOB: 12/04/1950 71 y.o. PCP: Angelica Pou, MD  Date of Admission: 09/21/2021  2:10 AM Date of Discharge:  09/24/2021 Attending Physician: Dr.  Cain Sieve  DISCHARGE DIAGNOSIS:  Primary Problem: Diarrhea   Hospital Problems: Principal Problem:   Diarrhea Active Problems:   Hypertension   Personal history of atrial fibrillation, now s/p ablation in NSR   GERD (gastroesophageal reflux disease)   Hyperlipidemia   Anemia   Primary osteoarthritis involving multiple joints   History of colonic polyps    DISCHARGE MEDICATIONS:   Allergies as of 09/24/2021       Reactions   Albuterol Palpitations   Caused afib    Percocet [oxycodone-acetaminophen] Shortness Of Breath, Itching, Anxiety   Makes jittery, difficulty breathing, itching   Celecoxib Rash   Protonix [pantoprazole Sodium] Palpitations   Tramadol Palpitations        Medication List     STOP taking these medications    glycerin adult 2 g suppository   linaclotide 145 MCG Caps capsule Commonly known as: Linzess       TAKE these medications    alendronate 70 MG tablet Commonly known as: Fosamax Take 1 tablet (70 mg total) by mouth every 7 (seven) days. Take with a full glass of water on an empty stomach.   atorvastatin 40 MG tablet Commonly known as: Lipitor Take 1 tablet (40 mg total) by mouth daily.   cetirizine 10 MG tablet Commonly known as: ZyrTEC Allergy Take 1 tablet (10 mg total) by mouth daily as needed for allergies.   diltiazem 120 MG 24 hr capsule Commonly known as: Cartia XT Take 1 capsule (120 mg total) by mouth 2 (two) times daily.   diltiazem 60 MG tablet Commonly known as: CARDIZEM Take 1 tablet (60 mg total) by mouth every 6 (six) hours as needed (afib).   docusate sodium 100 MG capsule Commonly known as: COLACE Take 1 capsule (100 mg total) by mouth daily. What changed:  when to take this reasons to take this   ezetimibe 10 MG  tablet Commonly known as: Zetia Take 1 tablet (10 mg total) by mouth daily.   famotidine 10 MG tablet Commonly known as: PEPCID Take 10 mg by mouth 2 (two) times daily as needed for heartburn or indigestion.   fluticasone 44 MCG/ACT inhaler Commonly known as: FLOVENT HFA Inhale 1 puff into the lungs daily.   HYDROcodone-acetaminophen 5-325 MG tablet Commonly known as: NORCO/VICODIN Take 1/2 tab to 1 tab if needed for days when you are experiencing severe pain. What changed:  how much to take how to take this when to take this reasons to take this additional instructions   hydrocortisone 1 % ointment Apply topically 3 (three) times daily.   levalbuterol 45 MCG/ACT inhaler Commonly known as: XOPENEX HFA INHALE 1 PUFF INTO THE LUNGS EVERY 6 HOURS AS NEEDED FOR SHORTNESS OF BREATH What changed:  how much to take how to take this when to take this reasons to take this additional instructions   losartan 50 MG tablet Commonly known as: COZAAR Take 2 tablets (100 mg total) by mouth daily.   montelukast 10 MG tablet Commonly known as: SINGULAIR Take 1 tablet (10 mg total) by mouth at bedtime.   omeprazole 40 MG capsule Commonly known as: PRILOSEC TAKE 1 CAPSULE(40 MG) BY MOUTH DAILY What changed: See the new instructions.   ondansetron 4 MG disintegrating tablet Commonly known as: ZOFRAN-ODT Take 1 tablet (4 mg total)  by mouth every 8 (eight) hours as needed for nausea or vomiting.   potassium chloride 10 MEQ tablet Commonly known as: KLOR-CON M Take 1 tablet (10 mEq total) by mouth daily.   rivaroxaban 20 MG Tabs tablet Commonly known as: XARELTO Take 1 tablet (20 mg total) by mouth daily with supper.   simethicone 80 MG chewable tablet Commonly known as: MYLICON Chew 1 tablet (80 mg total) by mouth 4 (four) times daily as needed for flatulence.   spironolactone 25 MG tablet Commonly known as: Aldactone Take 1 tablet (25 mg total) by mouth daily.    sucralfate 1 g tablet Commonly known as: Carafate Take 1 tablet (1 g total) by mouth 4 (four) times daily -  with meals and at bedtime.   tiotropium 18 MCG inhalation capsule Commonly known as: SPIRIVA Place 1 capsule (18 mcg total) into inhaler and inhale daily.               Durable Medical Equipment  (From admission, onward)           Start     Ordered   09/23/21 1439  For home use only DME Walker rolling  Once       Question Answer Comment  Walker: With [place] Wheels   Patient needs a walker to treat with the following condition Gait instability      09/23/21 1439   09/21/21 1312  For home use only DME Bedside commode  Once       Question:  Patient needs a bedside commode to treat with the following condition  Answer:  Physical deconditioning   09/21/21 1311            DISPOSITION AND FOLLOW-UP:  Ms.Lauramae L Mutz was discharged from [facility] in Stable condition. At the hospital follow up visit please address:  Abdominal pain  Diarrhea with +FOBT Hx of chronic constipation  Hx of internal and external hemorrhoids Patient advised to stop taking Linzess at discharge until she follows up with her PCP. She has been advised that she can still use MiraLax daily to keep bowels soft. She has been provided a cream for hemorrhoids. Surgical pathologies from EGD and colonoscopy are pending at this time. Recommendation to start 8-week course of PPI, po BID. Please address chronic constipation and medication frequencies, CBC to ensure continued stabilization of Hgb.  Microcytic anemia  Given presenting symptoms of severe GI upset, patient was repleted with IV iron. At follow-up I would recommend discussing need for long-term oral supplementation.    Hx of hypokalemia  Potassium supplements held during admission with normal potassium levels.    HLD Patient stated that she was not taking Lipitor as prescribed. Please discuss management with patient  at follow up.  Follow-up Recommendations: Consults: Gastroenterology Labs: Basic Metabolic Profile, CBC, and LDL Cholesterol Studies: None Medications: Please address changes to medications during admission: chronic constipation management, iron supplementation, initiation of PPI, patient not taking statin.  Follow-up Appointments:  You have an appointment scheduled with Dr. Jimmye Norman in the Internal Medicine Clinic on 10/01/2021 at 9:15 AM. You have an appointment scheduled with Dr. Havery Moros with gastroenterology on 10/19/2021 at 3:40 PM.   [place], [place] Follow up.   Specialty: Home Health Services Why: Someone will call you to schedule first home visit. Contact information: [place] [address] [phone]  HOSPITAL COURSE:  Patient Summary: #Abdominal pain  #Diarrhea with dark colored stools in s/o laxative overuse  #Hx of chronic constipation  #Hx of internal and external hemorrhoids On initial presentation patient complained of severe diarrhea over the last several weeks, particularly in the day prior to admission.  She did have positive FOBT on presentation as well as hemorrhoids on physical exam.  She endorsed taking MiraLAX twice daily and Linzess twice daily.  Home laxatives, Pepto-Bismol, and Xarelto were held at admission.  During the day on 01/31 she experienced significant pain and several episodes of diarrhea, one of which contained blood, prompting further evaluation with CT abdomen/pelvis on 01/31 which showed rectal wall thickening with mild perirectal fat stranding consistent with proctitis. On 02/02 patient underwent EGD which revealed normal esophagus, localized inflammation characterized by edema, erosions and erythema in gastric antrum, normal duodenum.  Patient also underwent colonoscopy which revealed normal ileum, two 3-4 mm polyps in descending and ascending colon which were  removed with a cold snare, erythematous mucosa in rectum, sigmoid colon, and descending colon which were biopsied, and non-bleeding internal hemorrhoids. Hemoglobin remained stable. Patient did endorse some post-op gas pains. Clinically she was appropriate for discharge after her procedures.   #Microcytic anemia  CBC during this admission revealed microcytic anemia with MCV 79.5 attributed to be iron deficiency given ferritin of 10. Iron, TIBC, saturations, UIBC WNL; RBC decreased at 3.85 and immature reticulocyte fraction high at 22.6%. Total iron deficit calculated with Concepcion Elk of 625 mg and patient was initiated on IV iron replacement with ferric gluconate 250 mg, two doses daily.   #HTN Chronic condition.  Continued home Losartan 100 mg, diltiazem 120 mg, and spironolactone 25 mg daily.   #Hx of hypokalemia  Noted during previous admission with outpatient initiation of potassium supplementation.  This was held during this admission given normal potassium levels.   #Hx of afib s/p ablation in NSR  Patient was in normal sinus rhythm with rate control during this admission. Continued on home diltiazem 120 mg twice daily.  Home Xarelto held.  PT/INR elevated at 23.2/2.1.   #HLD Chronic condition.  Patient denied taking prescribed Lipitor 40 mg daily.  Lipitor held during the stay.  #GERD Chronic condition.  Continued home Pepcid.   #Hx of Asthma  Chronic condition. Continued home Pulmicort, Levalbuterol, and Incurse Ellipta.   #Primary OA involving multiple joints  Chronically condition.  Continued home Norco 5-325 mg.   DISCHARGE INSTRUCTIONS:   Discharge Instructions     Call MD for:  persistant nausea and vomiting   Complete by: As directed    Call MD for:  severe uncontrolled pain   Complete by: As directed    Call MD for:  temperature >100.4   Complete by: As directed    Diet - low sodium heart healthy   Complete by: As directed    Increase activity slowly    Complete by: As directed        SUBJECTIVE:  Patient evaluated at bedside on day of discharge. She was a little groggy from the procedure and complained of left sided abdominal pain that she says was made better by pain medicine after the procedure. She is requesting medication for gas.  Discharge Vitals:   BP 137/85 (BP Location: Right Arm)    Pulse 88    Temp 98 F (36.7 C)    Resp 20    Ht 5\' 7"  (1.702 m)    Wt 59.9 kg  SpO2 98%    BMI 20.67 kg/m   OBJECTIVE:   Constitutional: Elderly female in no acute distress. Cardio: Regular rate and rhythm. No murmurs, rubs, gallops. Pulm: Clear to auscultation bilaterally. Abdomen: Tenderness to palpation mainly over LUQ with some guarding. Skin: Warm and dry. Neuro: Alert and oriented x3. No focal deficit noted. Psych: Normal mood and affect.  Pertinent Labs, Studies, and Procedures:  CBC Latest Ref Rng & Units 09/24/2021 09/23/2021 09/22/2021  WBC 4.0 - 10.5 K/uL 10.0 6.5 4.2  Hemoglobin 12.0 - 15.0 g/dL 10.7(L) 10.1(L) 9.3(L)  Hematocrit 36.0 - 46.0 % 31.8(L) 31.0(L) 28.4(L)  Platelets 150 - 400 K/uL 280 262 236    CMP Latest Ref Rng & Units 09/24/2021 09/23/2021 09/22/2021  Glucose 70 - 99 mg/dL 114(H) 99 88  BUN 8 - 23 mg/dL 14 9 11   Creatinine 0.44 - 1.00 mg/dL 1.13(H) 0.92 0.73  Sodium 135 - 145 mmol/L 141 137 138  Potassium 3.5 - 5.1 mmol/L 3.7 3.5 3.7  Chloride 98 - 111 mmol/L 108 104 106  CO2 22 - 32 mmol/L 21(L) 24 23  Calcium 8.9 - 10.3 mg/dL 9.4 9.8 9.1  Total Protein 6.5 - 8.1 g/dL - - 6.3(L)  Total Bilirubin 0.3 - 1.2 mg/dL - - 0.4  Alkaline Phos 38 - 126 U/L - - 58  AST 15 - 41 U/L - - 17  ALT 0 - 44 U/L - - 14    CT ABDOMEN PELVIS W CONTRAST  Result Date: 09/22/2021 CLINICAL DATA:  Severe abdominal pain, nausea and vomiting, diarrhea EXAM: CT ABDOMEN AND PELVIS WITH CONTRAST TECHNIQUE: Multidetector CT imaging of the abdomen and pelvis was performed using the standard protocol following bolus administration of  intravenous contrast. RADIATION DOSE REDUCTION: This exam was performed according to the departmental dose-optimization program which includes automated exposure control, adjustment of the mA and/or kV according to patient size and/or use of iterative reconstruction technique. CONTRAST:  70mL OMNIPAQUE IOHEXOL 300 MG/ML  SOLN COMPARISON:  08/29/2021 FINDINGS: Lower chest: No acute pleural or parenchymal lung disease. Hepatobiliary: Stable hepatic cysts. Stable transient hepatic attenuation difference inferior right lobe liver, normalizing on delayed images and stable since prior study. No other focal liver abnormalities. No intrahepatic duct dilation. Gallbladder is surgically absent. Pancreas: Unremarkable. No pancreatic ductal dilatation or surrounding inflammatory changes. Spleen: Normal in size without focal abnormality. Adrenals/Urinary Tract: Adrenal glands are unremarkable. Kidneys are normal, without renal calculi, focal lesion, or hydronephrosis. Bladder is decompressed, limiting its evaluation. Stomach/Bowel: No bowel obstruction or ileus. Normal appendix right lower quadrant. There is moderate rectal wall thickening which may reflect proctitis. Mild wall thickening elsewhere throughout the transverse and descending colon likely due to decompressed state. Vascular/Lymphatic: Aortic atherosclerosis. No enlarged abdominal or pelvic lymph nodes. Reproductive: Uterus and bilateral adnexa are unremarkable. Other: No free fluid or free intraperitoneal gas. Mild perirectal fat stranding. No abdominal wall hernia. Musculoskeletal: No acute or destructive bony lesions. Reconstructed images demonstrate no additional findings. IMPRESSION: 1. Rectal wall thickening and mild perirectal fat stranding, consistent with proctitis. 2. Otherwise no acute intra-abdominal or intrapelvic process. 3.  Aortic Atherosclerosis (ICD10-I70.0). Electronically Signed   By: Randa Ngo M.D.   On: 09/22/2021 15:45      Signed: Farrel Gordon, D.O.  Internal Medicine Resident, PGY-1 [facility]  Pager: [phone] 3:14 PM, 09/24/2021

## 2021-09-24 NOTE — Op Note (Signed)
[facility] Patient Name: [name] Procedure Date : 09/24/2021 MRN: [id number] Attending MD: Georgian Co ,  Date of Birth: 10-Apr-1951 CSN: [id number] Age: 71 Admit Type: Inpatient Procedure:                Upper GI endoscopy Indications:              Iron deficiency anemia Providers:                Adline Mango" Scheryl Darter RN, RN, Benetta Spar, Technician Referring MD:             Hospitalist team Medicines:                Monitored Anesthesia Care Complications:            No immediate complications. Estimated Blood Loss:     Estimated blood loss was minimal. Procedure:                Pre-Anesthesia Assessment:                           - Prior to the procedure, a History and Physical                            was performed, and patient medications and                            allergies were reviewed. The patient's tolerance of                            previous anesthesia was also reviewed. The risks                            and benefits of the procedure and the sedation                            options and risks were discussed with the patient.                            All questions were answered, and informed consent                            was obtained. Prior Anticoagulants: The patient has                            taken Xarelto (rivaroxaban), last dose was 2 days                            prior to procedure. ASA Grade Assessment: III - A                            patient with severe systemic disease. After  reviewing the risks and benefits, the patient was                            deemed in satisfactory condition to undergo the                            procedure.                           After obtaining informed consent, the endoscope was                            passed under direct vision. Throughout the                            procedure, the patient's blood  pressure, pulse, and                            oxygen saturations were monitored continuously. The                            [id number]-[id number] ([id number]) Olympus endoscope was introduced                            through the mouth, and advanced to the second part                            of duodenum. The upper GI endoscopy was                            accomplished without difficulty. The patient                            tolerated the procedure well. Scope In: Scope Out: Findings:      The examined esophagus was normal.      Localized inflammation characterized by congestion (edema), erosions and       erythema was found in the gastric antrum. Biopsies were taken with a       cold forceps for histology.      The examined duodenum was normal. Impression:               - Normal esophagus.                           - Gastritis. Biopsied.                           - Normal examined duodenum. Recommendation:           - Await pathology results.                           - Use a proton pump inhibitor PO BID for 8 weeks.                           - Avoid NSAIDs.                           -  Perform a colonoscopy today. Procedure Code(s):        --- Professional ---                           [phone], Esophagogastroduodenoscopy, flexible,                            transoral; with biopsy, single or multiple Diagnosis Code(s):        --- Professional ---                           K29.70, Gastritis, unspecified, without bleeding                           D50.9, Iron deficiency anemia, unspecified CPT copyright 2019 American Medical Association. All rights reserved. The codes documented in this report are preliminary and upon coder review may  be revised to meet current compliance requirements. [name] "Christia Reading,  09/24/2021 9:56:26 AM Number of Addenda: 0

## 2021-09-24 NOTE — Transfer of Care (Signed)
Immediate Anesthesia Transfer of Care Note  Patient: [name]  Procedure(s) Performed: COLONOSCOPY WITH PROPOFOL ESOPHAGOGASTRODUODENOSCOPY (EGD) WITH PROPOFOL BIOPSY POLYPECTOMY  Patient Location: PACU  Anesthesia Type:MAC  Level of Consciousness: awake, alert  and sedated  Airway & Oxygen Therapy: Patient connected to nasal cannula oxygen  Post-op Assessment: Post -op Vital signs reviewed and stable  Post vital signs: stable  Last Vitals:  Vitals Value Taken Time  BP 145/88 09/24/21 1034  Temp    Pulse 90 09/24/21 1034  Resp 25 09/24/21 1034  SpO2 100 % 09/24/21 1034  Vitals shown include unvalidated device data.  Last Pain:  Vitals:   09/24/21 0829  TempSrc: Temporal  PainSc: 9       Patients Stated Pain Goal: 0 (16/38/45 3646)  Complications: No notable events documented.

## 2021-09-24 NOTE — Progress Notes (Signed)
PT Cancellation Note  Patient Details Name: MADIE CAHN MRN: [id number] DOB: Apr 26, 1951   Cancelled Treatment:    Reason Eval/Treat Not Completed: Patient at procedure or test/unavailable  Off the floor at Endoscopy;   Will follow up later today as time allows;  Otherwise, will follow up for PT tomorrow;   Thank you,  Roney Marion, PT  Acute Rehabilitation Services Pager [phone] Office [phone]    [place] 09/24/2021, 10:07 AM

## 2021-09-24 NOTE — TOC Transition Note (Signed)
Transition of Care [facility]) - CM/SW Discharge Note   Patient Details  Name: [name] MRN: [id number] Date of Birth: 22-Sep-1950  Transition of Care [facility]) CM/SW Contact:  Tom-Johnson, Renea Ee, RN Phone Number: 09/24/2021, 4:43 PM   Clinical Narrative:    Patient is scheduled for discharge today. Home health referral on AVS. Bedside commode delivered to patient at bedside. Denies any other needs. Family to transport at discharge. No further TOC needs noted.   Final next level of care: [place] Barriers to Discharge: Barriers Resolved   Patient Goals and CMS Choice Patient states their goals for this hospitalization and ongoing recovery are:: To return home CMS Medicare.gov Compare Post Acute Care list provided to:: Patient Choice offered to / list presented to : Patient  Discharge Placement                Patient to be transferred to facility by: Family      Discharge Plan and Services                DME Arranged: 3-N-1 DME Agency: [employer] Date DME Agency Contacted: 09/22/21 Time DME Agency Contacted: 1000 Representative spoke with at DME Agency: Freda Munro HH Arranged: PT, OT [facility] Agency: [place] Date [place]: 09/22/21 Time [place]: 1030 Representative spoke with at [place]: [place] ([place]) Interventions     Readmission Risk Interventions No flowsheet data found.

## 2021-09-24 NOTE — Progress Notes (Signed)
DISCHARGE NOTE HOME [place] to be discharged Home per MD order. Discussed prescriptions and follow up appointments with the patient. Prescriptions given to patient; medication list explained in detail. Patient verbalized understanding.  Skin clean, dry and intact without evidence of skin break down, no evidence of skin tears noted. IV catheter discontinued intact. Site without signs and symptoms of complications. Dressing and pressure applied. Pt denies pain at the site currently. No complaints noted.  Patient free of lines, drains, and wounds.   An After Visit Summary (AVS) was printed and given to the patient. Patient escorted via wheelchair, and discharged home via private auto.  Vira Agar, RN

## 2021-09-24 NOTE — Consult Note (Signed)
[facility] Inpatient Consult   09/24/2021  [name] [birth year] [id number]  [facility] [[facility]]  Accountable Care Organization [ACO] Patient: [name] Medicare  Primary Care Provider:  Miguel Aschoff, MD, [facility] Internal Medicine is an embedded provider with a Chronic Care Management team and program, and is listed for the transition of care follow up and appointments.  Patient was screened for Embedded practice service needs for chronic care management has high ED utilization [7 ED visits in 6 months] and chronic diseases.  Plan: A referral request for  Embedded Chronic Care Management follow up.  Please contact for further questions,  Charlesetta Shanks, RN BSN CCM [facility]  [phone] business mobile phone Toll free office [phone]  Fax number: [phone] [name]

## 2021-09-24 NOTE — Op Note (Signed)
[facility] Patient Name: [name] Procedure Date : 09/24/2021 MRN: [id number] Attending MD: Georgian Co ,  Date of Birth: 10-02-1950 CSN: [id number] Age: 71 Admit Type: Inpatient Procedure:                Colonoscopy Indications:              Iron deficiency anemia Providers:                Adline Mango" Scheryl Darter RN, RN, Benetta Spar, Technician Referring MD:             Hospitalist Medicines:                Monitored Anesthesia Care Complications:            No immediate complications. Estimated Blood Loss:     Estimated blood loss was minimal. Procedure:                Pre-Anesthesia Assessment:                           - Prior to the procedure, a History and Physical                            was performed, and patient medications and                            allergies were reviewed. The patient's tolerance of                            previous anesthesia was also reviewed. The risks                            and benefits of the procedure and the sedation                            options and risks were discussed with the patient.                            All questions were answered, and informed consent                            was obtained. Prior Anticoagulants: The patient has                            taken Xarelto (rivaroxaban), last dose was 2 days                            prior to procedure. ASA Grade Assessment: III - A                            patient with severe systemic disease. After  reviewing the risks and benefits, the patient was                            deemed in satisfactory condition to undergo the                            procedure.                           After obtaining informed consent, the colonoscope                            was passed under direct vision. Throughout the                            procedure, the patient's blood pressure,  pulse, and                            oxygen saturations were monitored continuously. The                            PCF-HQ190TL ([id number] Olympus peds colonoscope was                            introduced through the anus and advanced to the the                            terminal ileum. The colonoscopy was performed                            without difficulty. The patient tolerated the                            procedure well. The quality of the bowel                            preparation was adequate. The terminal ileum,                            ileocecal valve, appendiceal orifice, and rectum                            were photographed. Scope In: 9:58:10 AM Scope Out: 10:26:03 AM Scope Withdrawal Time: 0 hours 14 minutes 21 seconds  Total Procedure Duration: 0 hours 27 minutes 53 seconds  Findings:      The terminal ileum appeared normal.      Two sessile polyps were found in the descending colon and ascending       colon. The polyps were 3 to 4 mm in size. These polyps were removed with       a cold snare. Resection and retrieval were complete.      A scattered area of erythematous mucosa was found in the rectum, in the       sigmoid colon, in the descending colon and in the transverse colon. This  was biopsied with a cold forceps for histology.      Non-bleeding internal hemorrhoids were found during retroflexion. Impression:               - The examined portion of the ileum was normal.                           - Two 3 to 4 mm polyps in the descending colon and                            in the ascending colon, removed with a cold snare.                            Resected and retrieved.                           - Erythematous mucosa in the rectum, in the sigmoid                            colon, in the descending colon and in the                            transverse colon. Biopsied.                           - Non-bleeding internal hemorrhoids. Recommendation:            - Discharge patient to home (with escort).                           - It is possible that her anemia may be related to                            blood loss from gastric erosions.                           - Await pathology results.                           - Continue iron supplementation.                           - Okay to restart Xarelto tomorrow.                           - The findings and recommendations were discussed                            with the patient. Procedure Code(s):        --- Professional ---                           [id number], Colonoscopy, flexible; with biopsy, single                            or multiple Diagnosis Code(s):        ---  Professional ---                           K64.8, Other hemorrhoids                           K62.89, Other specified diseases of anus and rectum                           K63.89, Other specified diseases of intestine                           D50.9, Iron deficiency anemia, unspecified CPT copyright 2019 American Medical Association. All rights reserved. The codes documented in this report are preliminary and upon coder review may  be revised to meet current compliance requirements. [name] "Christia Reading,  09/24/2021 10:32:59 AM Number of Addenda: 0

## 2021-09-24 NOTE — Telephone Encounter (Signed)
Appointment scheduled and patient notified via MyChart.

## 2021-09-24 NOTE — Telephone Encounter (Signed)
-----   Message from Sharyn Creamer, MD sent at 09/24/2021 10:36 AM EST ----- Hi Jan, please arrange for clinic follow up with Dr. Havery Moros or APP in 2-3 weeks for IDA.  Thanks, Lyndee Leo

## 2021-09-24 NOTE — Plan of Care (Signed)
Problem: Education: Goal: Knowledge of disease or condition will improve Outcome: Completed/Met Goal: Understanding of medication regimen will improve Outcome: Completed/Met Goal: Individualized Educational Video(s) Outcome: Completed/Met   Problem: Activity: Goal: Ability to tolerate increased activity will improve Outcome: Completed/Met   Problem: Cardiac: Goal: Ability to achieve and maintain adequate cardiopulmonary perfusion will improve Outcome: Completed/Met   Problem: Health Behavior/Discharge Planning: Goal: Ability to safely manage health-related needs after discharge will improve Outcome: Completed/Met   Problem: Education: Goal: Knowledge of General Education information will improve Description: Including pain rating scale, medication(s)/side effects and non-pharmacologic comfort measures Outcome: Completed/Met   Problem: Health Behavior/Discharge Planning: Goal: Ability to manage health-related needs will improve Outcome: Completed/Met   Problem: Clinical Measurements: Goal: Ability to maintain clinical measurements within normal limits will improve Outcome: Completed/Met Goal: Will remain free from infection Outcome: Completed/Met Goal: Diagnostic test results will improve Outcome: Completed/Met Goal: Respiratory complications will improve Outcome: Completed/Met Goal: Cardiovascular complication will be avoided Outcome: Completed/Met   Problem: Activity: Goal: Risk for activity intolerance will decrease Outcome: Completed/Met   Problem: Nutrition: Goal: Adequate nutrition will be maintained Outcome: Completed/Met   Problem: Coping: Goal: Level of anxiety will decrease Outcome: Completed/Met   Problem: Elimination: Goal: Will not experience complications related to bowel motility Outcome: Completed/Met Goal: Will not experience complications related to urinary retention Outcome: Completed/Met   Problem: Pain Managment: Goal: General  experience of comfort will improve Outcome: Completed/Met   Problem: Safety: Goal: Ability to remain free from injury will improve Outcome: Completed/Met   Problem: Skin Integrity: Goal: Risk for impaired skin integrity will decrease Outcome: Completed/Met   

## 2021-09-24 NOTE — Anesthesia Postprocedure Evaluation (Signed)
Anesthesia Post Note  Patient: [name]  Procedure(s) Performed: COLONOSCOPY WITH PROPOFOL ESOPHAGOGASTRODUODENOSCOPY (EGD) WITH PROPOFOL BIOPSY POLYPECTOMY     Patient location during evaluation: PACU Anesthesia Type: MAC Level of consciousness: awake and alert Pain management: pain level controlled Vital Signs Assessment: post-procedure vital signs reviewed and stable Respiratory status: spontaneous breathing, nonlabored ventilation, respiratory function stable and patient connected to nasal cannula oxygen Cardiovascular status: stable and blood pressure returned to baseline Postop Assessment: no apparent nausea or vomiting Anesthetic complications: no   No notable events documented.  Last Vitals:  Vitals:   09/24/21 1120 09/24/21 1135  BP: 140/84 137/85  Pulse: 96 88  Resp: 19 20  Temp: 36.7 C   SpO2: 99% 98%    Last Pain:  Vitals:   09/24/21 1135  TempSrc:   PainSc: Asleep                 Effie Berkshire

## 2021-09-24 NOTE — Interval H&P Note (Signed)
History and Physical Interval Note:  09/24/2021 8:32 AM  [name]  has presented today for surgery, with the diagnosis of Proctitis.  Diarrhea following increased dose of Linzess with background of previous constipation..  The various methods of treatment have been discussed with the patient and family. After consideration of risks, benefits and other options for treatment, the patient has consented to  Procedure(s): COLONOSCOPY WITH PROPOFOL (N/A) ESOPHAGOGASTRODUODENOSCOPY (EGD) WITH PROPOFOL (N/A) as a surgical intervention.  The patient's history has been reviewed, patient examined, no change in status, stable for surgery.  I have reviewed the patient's chart and labs.  Questions were answered to the patient's satisfaction.     Sharyn Creamer

## 2021-09-27 ENCOUNTER — Emergency Department (HOSPITAL_COMMUNITY): Payer: Medicare Other

## 2021-09-27 ENCOUNTER — Other Ambulatory Visit: Payer: Self-pay

## 2021-09-27 ENCOUNTER — Inpatient Hospital Stay (HOSPITAL_COMMUNITY)
Admission: EM | Admit: 2021-09-27 | Discharge: 2021-09-29 | DRG: 393 | Disposition: A | Discharge status: Home / Self Care | Payer: Medicare Other | Attending: Internal Medicine | Admitting: Internal Medicine

## 2021-09-27 ENCOUNTER — Encounter (HOSPITAL_COMMUNITY): Payer: Self-pay | Admitting: Emergency Medicine

## 2021-09-27 DIAGNOSIS — K5904 Chronic idiopathic constipation: Secondary | ICD-10-CM | POA: Diagnosis not present

## 2021-09-27 DIAGNOSIS — R9431 Abnormal electrocardiogram [ECG] [EKG]: Secondary | ICD-10-CM | POA: Diagnosis not present

## 2021-09-27 DIAGNOSIS — J4520 Mild intermittent asthma, uncomplicated: Secondary | ICD-10-CM | POA: Diagnosis present

## 2021-09-27 DIAGNOSIS — K9189 Other postprocedural complications and disorders of digestive system: Secondary | ICD-10-CM | POA: Diagnosis not present

## 2021-09-27 DIAGNOSIS — I10 Essential (primary) hypertension: Secondary | ICD-10-CM | POA: Diagnosis present

## 2021-09-27 DIAGNOSIS — S36039A Unspecified laceration of spleen, initial encounter: Secondary | ICD-10-CM | POA: Diagnosis present

## 2021-09-27 DIAGNOSIS — Z888 Allergy status to other drugs, medicaments and biological substances status: Secondary | ICD-10-CM | POA: Diagnosis not present

## 2021-09-27 DIAGNOSIS — K921 Melena: Secondary | ICD-10-CM | POA: Diagnosis not present

## 2021-09-27 DIAGNOSIS — K5909 Other constipation: Secondary | ICD-10-CM | POA: Diagnosis present

## 2021-09-27 DIAGNOSIS — G8929 Other chronic pain: Secondary | ICD-10-CM | POA: Diagnosis present

## 2021-09-27 DIAGNOSIS — Z743 Need for continuous supervision: Secondary | ICD-10-CM | POA: Diagnosis not present

## 2021-09-27 DIAGNOSIS — D62 Acute posthemorrhagic anemia: Secondary | ICD-10-CM | POA: Diagnosis present

## 2021-09-27 DIAGNOSIS — K2970 Gastritis, unspecified, without bleeding: Secondary | ICD-10-CM | POA: Diagnosis present

## 2021-09-27 DIAGNOSIS — Z885 Allergy status to narcotic agent status: Secondary | ICD-10-CM

## 2021-09-27 DIAGNOSIS — Z87891 Personal history of nicotine dependence: Secondary | ICD-10-CM

## 2021-09-27 DIAGNOSIS — M546 Pain in thoracic spine: Secondary | ICD-10-CM | POA: Diagnosis present

## 2021-09-27 DIAGNOSIS — K635 Polyp of colon: Secondary | ICD-10-CM | POA: Diagnosis present

## 2021-09-27 DIAGNOSIS — K661 Hemoperitoneum: Secondary | ICD-10-CM

## 2021-09-27 DIAGNOSIS — Z7901 Long term (current) use of anticoagulants: Secondary | ICD-10-CM

## 2021-09-27 DIAGNOSIS — E876 Hypokalemia: Secondary | ICD-10-CM | POA: Diagnosis not present

## 2021-09-27 DIAGNOSIS — E785 Hyperlipidemia, unspecified: Secondary | ICD-10-CM | POA: Diagnosis present

## 2021-09-27 DIAGNOSIS — Z886 Allergy status to analgesic agent status: Secondary | ICD-10-CM | POA: Diagnosis not present

## 2021-09-27 DIAGNOSIS — K219 Gastro-esophageal reflux disease without esophagitis: Secondary | ICD-10-CM | POA: Diagnosis not present

## 2021-09-27 DIAGNOSIS — Y848 Other medical procedures as the cause of abnormal reaction of the patient, or of later complication, without mention of misadventure at the time of the procedure: Secondary | ICD-10-CM | POA: Diagnosis present

## 2021-09-27 DIAGNOSIS — R58 Hemorrhage, not elsewhere classified: Secondary | ICD-10-CM | POA: Diagnosis not present

## 2021-09-27 DIAGNOSIS — R1084 Generalized abdominal pain: Secondary | ICD-10-CM | POA: Diagnosis not present

## 2021-09-27 DIAGNOSIS — K648 Other hemorrhoids: Secondary | ICD-10-CM | POA: Diagnosis present

## 2021-09-27 DIAGNOSIS — R109 Unspecified abdominal pain: Secondary | ICD-10-CM | POA: Diagnosis not present

## 2021-09-27 DIAGNOSIS — Z7983 Long term (current) use of bisphosphonates: Secondary | ICD-10-CM

## 2021-09-27 DIAGNOSIS — R531 Weakness: Secondary | ICD-10-CM | POA: Diagnosis not present

## 2021-09-27 DIAGNOSIS — Z79899 Other long term (current) drug therapy: Secondary | ICD-10-CM

## 2021-09-27 DIAGNOSIS — Z20822 Contact with and (suspected) exposure to covid-19: Secondary | ICD-10-CM | POA: Diagnosis not present

## 2021-09-27 DIAGNOSIS — S36039D Unspecified laceration of spleen, subsequent encounter: Secondary | ICD-10-CM | POA: Diagnosis not present

## 2021-09-27 DIAGNOSIS — I4891 Unspecified atrial fibrillation: Secondary | ICD-10-CM | POA: Diagnosis not present

## 2021-09-27 DIAGNOSIS — I700 Atherosclerosis of aorta: Secondary | ICD-10-CM | POA: Diagnosis not present

## 2021-09-27 DIAGNOSIS — K3189 Other diseases of stomach and duodenum: Secondary | ICD-10-CM | POA: Diagnosis not present

## 2021-09-27 DIAGNOSIS — J45909 Unspecified asthma, uncomplicated: Secondary | ICD-10-CM | POA: Diagnosis not present

## 2021-09-27 DIAGNOSIS — Z8249 Family history of ischemic heart disease and other diseases of the circulatory system: Secondary | ICD-10-CM

## 2021-09-27 DIAGNOSIS — K6389 Other specified diseases of intestine: Secondary | ICD-10-CM | POA: Diagnosis not present

## 2021-09-27 HISTORY — DX: Hemoperitoneum: K66.1

## 2021-09-27 LAB — I-STAT CHEM 8, ED
BUN: 4 mg/dL — ABNORMAL LOW (ref 8–23)
Calcium, Ion: 1.16 mmol/L (ref 1.15–1.40)
Chloride: 106 mmol/L (ref 98–111)
Creatinine, Ser: 0.6 mg/dL (ref 0.44–1.00)
Glucose, Bld: 99 mg/dL (ref 70–99)
HCT: 27 % — ABNORMAL LOW (ref 36.0–46.0)
Hemoglobin: 9.2 g/dL — ABNORMAL LOW (ref 12.0–15.0)
Potassium: 2.8 mmol/L — ABNORMAL LOW (ref 3.5–5.1)
Sodium: 142 mmol/L (ref 135–145)
TCO2: 25 mmol/L (ref 22–32)

## 2021-09-27 LAB — COMPREHENSIVE METABOLIC PANEL
ALT: 15 U/L (ref 0–44)
AST: 17 U/L (ref 15–41)
Albumin: 3.3 g/dL — ABNORMAL LOW (ref 3.5–5.0)
Alkaline Phosphatase: 58 U/L (ref 38–126)
Anion gap: 8 (ref 5–15)
BUN: 5 mg/dL — ABNORMAL LOW (ref 8–23)
CO2: 25 mmol/L (ref 22–32)
Calcium: 9 mg/dL (ref 8.9–10.3)
Chloride: 108 mmol/L (ref 98–111)
Creatinine, Ser: 0.78 mg/dL (ref 0.44–1.00)
GFR, Estimated: >60 mL/min (ref >60)
Glucose, Bld: 98 mg/dL (ref 70–99)
Potassium: 2.8 mmol/L — ABNORMAL LOW (ref 3.5–5.1)
Sodium: 141 mmol/L (ref 135–145)
Total Bilirubin: 0.1 mg/dL — ABNORMAL LOW (ref 0.3–1.2)
Total Protein: 6.4 g/dL — ABNORMAL LOW (ref 6.5–8.1)

## 2021-09-27 LAB — CBC WITH DIFFERENTIAL/PLATELET
Abs Immature Granulocytes: 0.02 10*3/uL (ref 0.00–0.07)
Basophils Absolute: 0 10*3/uL (ref 0.0–0.1)
Basophils Relative: 0 %
Eosinophils Absolute: 0.1 10*3/uL (ref 0.0–0.5)
Eosinophils Relative: 2 %
HCT: 26.6 % — ABNORMAL LOW (ref 36.0–46.0)
Hemoglobin: 8.7 g/dL — ABNORMAL LOW (ref 12.0–15.0)
Immature Granulocytes: 0 %
Lymphocytes Relative: 18 %
Lymphs Abs: 1.2 10*3/uL (ref 0.7–4.0)
MCH: 26.5 pg (ref 26.0–34.0)
MCHC: 32.7 g/dL (ref 30.0–36.0)
MCV: 81.1 fL (ref 80.0–100.0)
Monocytes Absolute: 0.7 10*3/uL (ref 0.1–1.0)
Monocytes Relative: 11 %
Neutro Abs: 4.4 10*3/uL (ref 1.7–7.7)
Neutrophils Relative %: 69 %
Platelets: 210 10*3/uL (ref 150–400)
RBC: 3.28 MIL/uL — ABNORMAL LOW (ref 3.87–5.11)
RDW: 16.3 % — ABNORMAL HIGH (ref 11.5–15.5)
WBC: 6.5 10*3/uL (ref 4.0–10.5)
nRBC: 0 % (ref 0.0–0.2)

## 2021-09-27 LAB — RESP PANEL BY RT-PCR (FLU A&B, COVID) ARPGX2
Influenza A by PCR: NEGATIVE
Influenza B by PCR: NEGATIVE
SARS Coronavirus 2 by RT PCR: NEGATIVE

## 2021-09-27 LAB — LIPASE, BLOOD: Lipase: 34 U/L (ref 11–51)

## 2021-09-27 LAB — LACTIC ACID, PLASMA: Lactic Acid, Venous: 1.1 mmol/L (ref 0.5–1.9)

## 2021-09-27 LAB — TYPE AND SCREEN
ABO/RH(D): O POS
Antibody Screen: NEGATIVE

## 2021-09-27 MED ORDER — POLYETHYLENE GLYCOL 3350 17 G PO PACK: 17.0000 g | PACK | Freq: Every day | ORAL | Status: DC

## 2021-09-27 MED ORDER — IOHEXOL 300 MG/ML  SOLN
80.0000 mL | Freq: Once | INTRAMUSCULAR | Status: AC | PRN
  Administered 2021-09-27: 80 mL via INTRAVENOUS

## 2021-09-27 MED ORDER — TIOTROPIUM BROMIDE MONOHYDRATE 18 MCG IN CAPS
18.0000 ug | ORAL_CAPSULE | Freq: Every day | RESPIRATORY_TRACT | Status: DC
Start: 2021-09-27 — End: 2021-09-27

## 2021-09-27 MED ORDER — DOCUSATE SODIUM 100 MG PO CAPS: 100.0000 mg | ORAL_CAPSULE | Freq: Every day | ORAL | Status: DC

## 2021-09-27 MED ORDER — HYDROMORPHONE HCL 1 MG/ML IJ SOLN: 1.0000 mg | INTRAMUSCULAR | Status: DC | PRN

## 2021-09-27 MED ORDER — LEVALBUTEROL TARTRATE 45 MCG/ACT IN AERO: 1.0000 | INHALATION_SPRAY | Freq: Four times a day (QID) | RESPIRATORY_TRACT | Status: DC | PRN

## 2021-09-27 MED ORDER — METOPROLOL TARTRATE 5 MG/5ML IV SOLN: 2.5000 mg | Freq: Three times a day (TID) | INTRAVENOUS | Status: DC

## 2021-09-27 MED ORDER — NON FORMULARY: 40.0000 mg | Freq: Two times a day (BID) | Status: DC

## 2021-09-27 MED ORDER — ACETAMINOPHEN 325 MG PO TABS
650.0000 mg | ORAL_TABLET | Freq: Four times a day (QID) | ORAL | Status: DC | PRN
  Administered 2021-09-28 – 2021-09-29 (×2): 650 mg via ORAL
  Filled 2021-09-27 (×2): qty 2

## 2021-09-27 MED ORDER — DOCUSATE SODIUM 100 MG PO CAPS
100.0000 mg | ORAL_CAPSULE | Freq: Every day | ORAL | Status: DC
  Administered 2021-09-27 – 2021-09-28 (×2): 100 mg via ORAL
  Filled 2021-09-27 (×2): qty 1

## 2021-09-27 MED ORDER — UMECLIDINIUM BROMIDE 62.5 MCG/ACT IN AEPB
1.0000 | INHALATION_SPRAY | Freq: Every day | RESPIRATORY_TRACT | Status: DC
  Administered 2021-09-28 – 2021-09-29 (×2): 1 via RESPIRATORY_TRACT
  Filled 2021-09-27: qty 7

## 2021-09-27 MED ORDER — ONDANSETRON HCL 4 MG/2ML IJ SOLN
4.0000 mg | Freq: Once | INTRAMUSCULAR | Status: AC
  Administered 2021-09-27: 4 mg via INTRAVENOUS
  Filled 2021-09-27: qty 2

## 2021-09-27 MED ORDER — POTASSIUM CHLORIDE 10 MEQ/100ML IV SOLN
10.0000 meq | INTRAVENOUS | Status: AC
  Administered 2021-09-27 (×2): 10 meq via INTRAVENOUS
  Filled 2021-09-27 (×3): qty 100

## 2021-09-27 MED ORDER — ONDANSETRON HCL 4 MG/2ML IJ SOLN
4.0000 mg | Freq: Three times a day (TID) | INTRAMUSCULAR | Status: DC | PRN
  Administered 2021-09-27 – 2021-09-29 (×4): 4 mg via INTRAVENOUS
  Filled 2021-09-27 (×4): qty 2

## 2021-09-27 MED ORDER — PROTHROMBIN COMPLEX CONC HUMAN 500 UNITS IV KIT
3259.0000 [IU] | PACK | Status: AC
  Administered 2021-09-27: 3259 [IU] via INTRAVENOUS
  Filled 2021-09-27: qty 3259

## 2021-09-27 MED ORDER — HYDROMORPHONE HCL 1 MG/ML IJ SOLN
1.0000 mg | INTRAMUSCULAR | Status: DC | PRN
  Administered 2021-09-27 – 2021-09-29 (×4): 1 mg via INTRAVENOUS
  Filled 2021-09-27 (×4): qty 1

## 2021-09-27 MED ORDER — HYDROMORPHONE HCL 1 MG/ML IJ SOLN
1.0000 mg | Freq: Once | INTRAMUSCULAR | Status: AC
  Administered 2021-09-27: 1 mg via INTRAVENOUS
  Filled 2021-09-27: qty 1

## 2021-09-27 MED ORDER — SIMETHICONE 80 MG PO CHEW
80.0000 mg | CHEWABLE_TABLET | Freq: Four times a day (QID) | ORAL | Status: DC | PRN
  Administered 2021-09-27 – 2021-09-29 (×4): 80 mg via ORAL
  Filled 2021-09-27 (×4): qty 1

## 2021-09-27 MED ORDER — POLYETHYLENE GLYCOL 3350 17 G PO PACK
17.0000 g | PACK | Freq: Every day | ORAL | Status: DC
  Administered 2021-09-27 – 2021-09-28 (×2): 17 g via ORAL
  Filled 2021-09-27 (×3): qty 1

## 2021-09-27 MED ORDER — ACETAMINOPHEN 650 MG RE SUPP: 650.0000 mg | Freq: Four times a day (QID) | RECTAL | Status: DC | PRN

## 2021-09-27 MED ORDER — LACTATED RINGERS IV SOLN: INTRAVENOUS | Status: AC

## 2021-09-27 MED ORDER — MORPHINE SULFATE (PF) 2 MG/ML IV SOLN
2.0000 mg | Freq: Once | INTRAVENOUS | Status: AC
  Administered 2021-09-27: 2 mg via INTRAVENOUS
  Filled 2021-09-27: qty 1

## 2021-09-27 NOTE — Plan of Care (Signed)

## 2021-09-27 NOTE — ED Triage Notes (Signed)
Pt BIB [facility]. Pt had a colonoscopy Thursday and has had bleeding, unsure if from rectum or vagina. Pt endorses LLQ pain. Pt takes xarelto.

## 2021-09-27 NOTE — H&P (Addendum)
Date: 09/27/2021               Patient Name:  [name] MRN: [id number]  DOB: 11/11/1950 Age / Sex: 71 y.o., female   PCP: Angelica Pou, MD         Medical Service: Internal Medicine Teaching Service         Attending Physician: Dr. Lottie Mussel, MD    First Contact: Dr. Jeanice Lim Pager: [phone]  Second Contact: Dr. Laural Golden Pager: [phone]       After Hours (After 5p/  First Contact Pager: [phone]  weekends / holidays): Second Contact Pager: [phone]   Chief Complaint: GI bleed  History of Present Illness: [name] is 71 year old female with pertinent PMHx of A. fib on Xarelto, HLD, HTN, asthma, osteoarthritis presenting with 1 day history of rectal bleeding.  Recently underwent colonoscopy on 09/24/2021.  Since the procedure, she developed severe left sided pain this morning.  Described as a sharp stabbing pain.  That radiates to the epigastric region.  Very tender to touch.  Noticed maroon colored blood when she wiped today, felt like she was "having a period" with significant bleeding. Abdominal pain has been going on since her surgery but has worsened and now left sided. Movement makes the pain worse. No BM since colonoscopy 09/24/2021. She has been passing gas, taking gas x. She did take a stool softener as well. Endorses nausea. Urinating normal. Last dose of xarelto was last night.   Smoked many years ago. Denies alcohol and illicit drug use. She is on disability due to HTN, cardiac problems, asthma and afib.   Meds:  No current facility-administered medications on file prior to encounter.   Current Outpatient Medications on File Prior to Encounter  Medication Sig Dispense Refill   alendronate (FOSAMAX) 70 MG tablet Take 1 tablet (70 mg total) by mouth every 7 (seven) days. Take with a full glass of water on an empty stomach. 12 tablet 3   atorvastatin (LIPITOR) 40 MG tablet Take 1 tablet (40 mg total) by mouth daily. 90 tablet 3   cetirizine (ZYRTEC ALLERGY) 10  MG tablet Take 1 tablet (10 mg total) by mouth daily as needed for allergies. 90 tablet 3   diltiazem (CARDIZEM) 60 MG tablet Take 1 tablet (60 mg total) by mouth every 6 (six) hours as needed (afib). 45 tablet 1   diltiazem (CARTIA XT) 120 MG 24 hr capsule Take 1 capsule (120 mg total) by mouth 2 (two) times daily. 180 capsule 2   docusate sodium (COLACE) 100 MG capsule Take 1 capsule (100 mg total) by mouth daily. (Patient taking differently: Take 100 mg by mouth daily as needed for mild constipation.) 14 capsule 0   ezetimibe (ZETIA) 10 MG tablet Take 1 tablet (10 mg total) by mouth daily. 30 tablet 11   famotidine (PEPCID) 10 MG tablet Take 10 mg by mouth 2 (two) times daily as needed for heartburn or indigestion.     fluticasone (FLOVENT HFA) 44 MCG/ACT inhaler Inhale 1 puff into the lungs daily. 30.8 g 1   HYDROcodone-acetaminophen (NORCO/VICODIN) 5-325 MG tablet Take 1/2 tab to 1 tab if needed for days when you are experiencing severe pain. (Patient taking differently: Take 0.5-1 tablets by mouth every 4 (four) hours as needed for severe pain.) 30 tablet 0   hydrocortisone 1 % ointment Apply topically 3 (three) times daily. 30 g 0   levalbuterol (XOPENEX HFA) 45 MCG/ACT inhaler INHALE 1  PUFF INTO THE LUNGS EVERY 6 HOURS AS NEEDED FOR SHORTNESS OF BREATH (Patient taking differently: Inhale 1 puff into the lungs every 6 (six) hours as needed for shortness of breath.) 45 g 1   losartan (COZAAR) 50 MG tablet Take 2 tablets (100 mg total) by mouth daily. 135 tablet 3   montelukast (SINGULAIR) 10 MG tablet Take 1 tablet (10 mg total) by mouth at bedtime. 90 tablet 3   omeprazole (PRILOSEC) 40 MG capsule Take 1 capsule (40 mg total) by mouth in the morning and at bedtime. 90 capsule 1   ondansetron (ZOFRAN-ODT) 4 MG disintegrating tablet Take 1 tablet (4 mg total) by mouth every 8 (eight) hours as needed for nausea or vomiting. 12 tablet 0   potassium chloride SA (KLOR-CON M) 10 MEQ tablet Take 1  tablet (10 mEq total) by mouth daily. 60 tablet 0   rivaroxaban (XARELTO) 20 MG TABS tablet Take 1 tablet (20 mg total) by mouth daily with supper. 30 tablet 11   simethicone (MYLICON) 80 MG chewable tablet Chew 1 tablet (80 mg total) by mouth 4 (four) times daily as needed for flatulence. 30 tablet 0   spironolactone (ALDACTONE) 25 MG tablet Take 1 tablet (25 mg total) by mouth daily. 90 tablet 3   sucralfate (CARAFATE) 1 g tablet Take 1 tablet (1 g total) by mouth 4 (four) times daily -  with meals and at bedtime. 120 tablet 0   tiotropium (SPIRIVA) 18 MCG inhalation capsule Place 1 capsule (18 mcg total) into inhaler and inhale daily. 90 capsule 3         Allergies: Allergies as of 09/27/2021 - Review Complete 09/27/2021  Allergen Reaction Noted   Albuterol Palpitations 09/20/2017   Percocet [oxycodone-acetaminophen] Shortness Of Breath, Itching, and Anxiety 12/07/2018   Celecoxib Rash 04/11/2007   Protonix [pantoprazole sodium] Palpitations 11/28/2015   Tramadol Palpitations 10/13/2020   Past Medical History:  Diagnosis Date   Asthma in adult, mild intermittent, uncomplicated    Chronic anticoagulation    Chronic constipation    Eczema    GERD (gastroesophageal reflux disease)    History of kidney stones    Hx of vaginal bleeding, resolved, ultrasound completed 10/11/2018   Hypertension    Personal history of atrial fibrillation, s/p ablation, now in NSR but remains on anticoaulation 11/26/2015   Followed in cardiology clinic     Family History: Mother-heart attack; father deceased; breast cancer and lung cancer in siblings.  Social History: Patient is disabled; smokes cigarettes over 15 years ago; denies alcohol or illicit drug use.  Review of Systems: A complete ROS was negative except as per HPI.   Physical Exam: Blood pressure 114/70, pulse 71, temperature 98.6 F (37 C), temperature source Oral, resp. rate (!) 23, height 5\' 7"  (1.702 m), weight 59.9 kg, SpO2 96  %. Physical Exam Constitutional:      General: She is in acute distress.  HENT:     Head: Normocephalic and atraumatic.  Cardiovascular:     Rate and Rhythm: Normal rate and regular rhythm.     Heart sounds: Normal heart sounds.  Pulmonary:     Effort: Pulmonary effort is normal.     Breath sounds: Normal breath sounds.  Abdominal:     General: Abdomen is flat.     Tenderness: There is abdominal tenderness in the left upper quadrant and left lower quadrant. There is no guarding or rebound.  Musculoskeletal:     Right lower leg: No edema.  Left lower leg: No edema.  Skin:    General: Skin is warm and dry.  Neurological:     General: No focal deficit present.     Mental Status: She is alert.  Psychiatric:        Behavior: Behavior normal. Behavior is cooperative.     EKG: personally reviewed my interpretation is normal sinus rhythm; no significant ST or T wave changes.  CT Abdomen Pelvis W Contrast  Addendum Date: 09/27/2021   ADDENDUM REPORT: 09/27/2021 09:40 ADDENDUM: Study discussed by telephone with Dr. Marda Stalker on 09/27/2021 at 0926 hours. Electronically Signed   By: Genevie Ann M.D.   On: 09/27/2021 09:40   Result Date: 09/27/2021 CLINICAL DATA:  71 year old female with abdominal pain. Status post colonoscopy several days ago. EXAM: CT ABDOMEN AND PELVIS WITH CONTRAST TECHNIQUE: Multidetector CT imaging of the abdomen and pelvis was performed using the standard protocol following bolus administration of intravenous contrast. RADIATION DOSE REDUCTION: This exam was performed according to the departmental dose-optimization program which includes automated exposure control, adjustment of the mA and/or kV according to patient size and/or use of iterative reconstruction technique. CONTRAST:  65mL OMNIPAQUE IOHEXOL 300 MG/ML  SOLN COMPARISON:  CT Abdomen and Pelvis 09/22/2021 and earlier. FINDINGS: Lower chest: Increased elevation of the right hemidiaphragm with posterolateral  basal segment lung opacity indeterminate for atelectasis versus infection. 5 mm right lower lobe lung nodule on series 6, image 7 was subtle last month and present on a 2019 cardiac CT, stable and benign. No pericardial or pleural effusion. Mild left lung base atelectasis. Hepatobiliary: Stable multiple circumscribed hepatic cysts. Surgically absent gallbladder. Small linear area of inferior right lobe hypoenhancement appears unchanged since since last year and benign. Pancreas: Negative. Chronic enlargement of the pancreatic duct in the head and uncinate appears stable since last year. Spleen: Small volume of perisplenic fluid in cleat, including caudal to the spleen, with complex fluid density consistent with hemoperitoneum. At the same time, there is no discrete splenic laceration. No convincing splenic contusion. Adrenals/Urinary Tract: Adrenal glands and kidneys appear stable and within normal limits. Unremarkable bladder. Chronic pelvic phleboliths. No urinary calculus identified. Symmetric renal contrast excretion. Stomach/Bowel: Decompressed rectum and sigmoid colon now with a more normal appearance compared to 09/22/2021. Redundant sigmoid tracking into the left upper quadrant. Decompressed descending colon and splenic flexure. Redundant transverse colon with retained stool. Similar retained stool in the right colon. No acute large bowel wall thickening identified. Normal appendix suspected on coronal image 41. Negative terminal ileum. No dilated small bowel. Decompressed stomach. Duodenum is decompressed and does appear to cross midline although proximal small bowel loops appear to be in the right upper quadrant. No free air. No free fluid identified in the mesentery. Vascular/Lymphatic: Extensive Aortoiliac calcified atherosclerosis. Mildly tortuous abdominal aorta. Major arterial structures remain patent. Portal venous system is patent. No lymphadenopathy identified. Reproductive: Stable and within  normal limits. Other: Complex fluid, hemoperitoneum in the pelvis, small to moderate volume (series 4, image 61) Musculoskeletal: Advanced lumbar disc and endplate degeneration redemonstrated. No acute or suspicious osseous abnormality identified. IMPRESSION: 1. Positive for Hemoperitoneum - about the spleen and layering in the pelvis, small to moderate volume. No free air or discrete bowel injury identified. Acute splenic injury felt to be the most likely source of bleeding in this setting, despite no obvious splenic laceration. 2. Redundant large bowel with retained stool. Regression of rectosigmoid colon wall thickening since last month. 3. Increased elevation of the right hemidiaphragm with right  lung base opacity indeterminate for atelectasis versus infection. Right lower lobe lung nodule stable since 2019 and benign (no follow-up indicated). 4. Aortic Atherosclerosis (ICD10-I70.0). Electronically Signed: By: Genevie Ann M.D. On: 09/27/2021 09:15     Assessment & Plan by Problem: Principal Problem:   Hemoperitoneum   #Hemoperitoneum 2/2 colonoscopy #Rectal bleeding Patient underwent routine colonoscopy 09/24/2021.  Per colonoscopy report, there were 2 polyps in the descending colon and 2 polyps in the ascending colon that were removed.  Since then patient experienced left-sided abdominal pain that radiates to the epigastric area.  Patient now presenting with 1 day history of rectal bleeding, maroon in color.  Patient has prior abdominal surgery, cholecystectomy in 2020.  CT of abdomen and pelvis reveals hemoperitoneum near the spleen layering into the pelvis with small to moderate volume.  No free air or discrete bowel injury noted.  Stool burden present.  Negative for perforation.  Hemoglobin dropped from 10.7-->>8.7 over the course of 3 days indicating acute blood loss.  Surgery was consulted and is recommending observation at this time.  Patient is hemodynamically stable, no tachycardia or hypotension.  Patient has been on Xarelto with last dose yesterday. --Anticoagulation reversal with Kcentra; hold Xarelto --Trend CBC --Transfuse if hemoglobin falls below 7 -- IV Dilaudid 1mg  Q4H PRN for severe pain --Tylenol 650 every 6 as needed for mild pain --IV Zofran 4 mg every 8 as needed for nausea/vomiting --Monitor for clinical deterioration or signs of active bleeding --General surgery recs appreciated  Atrial fibrillation On Xarelto, last dose yesterday 09/26/21.  Patient is rate controlled on diltiazem 120 mg twice daily; 60 mg every 6 hours as needed.  Patient is compliant with her medication.  Patient denies chest pain, shortness of breath or palpitations.  EKG is reassuring, normal sinus rhythm, no sign of STEMI or arrhythmia.  Patient is currently in normal sinus rhythm. Given anticoagulation is held in the setting of acute blood loss;  In the event patient goes into A. fib consider rate control with metoprolol or diltiazem. --Hold Xarelto --Continue cardiac monitoring  Hypokalemia Initial labs obtained in the ED reveals hypokalemia at 2.8.  Patient denies numbness, weakness tremors. No significant T wave changes on EKG noted -- 4 runs of potassium chloride 10 mEq  --Trend BMP  Asthma Home medications include fluticasone and levalbuterol.  Patient denies shortness of breath.  Patient satting well on room air.  No increased work of breathing noted on exam. Lungs were clear on ascultation. --Levalbuterol every 6 hours as needed --Incruse Ellipta 1 puff daily  GERD Home medication include omeprazole 40 mg twice daily.  Patient expressed some discomfort in the epigastric region.  Likely referred pain from left-sided hemoperitoneum. --Protonix 40 mg daily start tomorrow   Dispo: Admit patient to Observation with expected length of stay less than 2 midnights.  Signed: Timothy Lasso, MD 09/27/2021, 12:00 PM  Pager: [phone] After 5pm on weekdays and 1pm on weekends: On Call pager:  [phone]

## 2021-09-27 NOTE — Consult Note (Addendum)
[name] 1951/08/04  [id number].    Requesting MD: Cain Sieve, MD Chief Complaint/Reason for Consult: hemoperitoneum  HPI:  [name] is a 71 y/o F with multiple medical comorbidities, listed below, who presented to the ED with a chief complaint of worsening abdominal pain along with rectal bleeding.  Patient reports she had a colonoscopy on 09/24/2021.  Endorses left-sided abdominal pain ever since her procedure.  Pain described as constant, worsening, this morning it started radiating across her upper abdomen "under her ribs ".  Pain is temporarily relieved by pain medication.  Reports some flatus since procedure but no bowel movement.  Took a stool softener thinking if she had a BM it would help her feel better.  When she sat on the commode she did not have a bowel movement but had maroon-colored blood on the toilet paper when she wiped.  She said it looked like a menstrual period.  She denies fever, chills, nausea, or vomiting.  She denies urinary symptoms.  States she is a former smoker who quit over 15 years ago.  Denies alcohol or drug use.  States she is currently on disability for her multiple medical problems.  She currently takes Xarelto for her A. fib, her last dose was last night, 2/4.   CT of the abdomen and pelvis performed in the emergency department revealed hemoperitoneum around the spleen and in the pelvis.  General surgery has been asked to consult  ROS: Review of Systems  All other systems reviewed and are negative.  Family History  Problem Relation Age of Onset   Heart attack Mother        Young age   Breast cancer Sister        Late 30s; now s/p mastectomy    Lung cancer Brother        x 2   Cancer Sister    Cancer Sister    Cancer Sister    Colon cancer Neg Hx    Colon polyps Neg Hx    Esophageal cancer Neg Hx    Rectal cancer Neg Hx    Stomach cancer Neg Hx     Past Medical History:  Diagnosis Date   Asthma in adult, mild intermittent,  uncomplicated    Chronic anticoagulation    Chronic constipation    Eczema    GERD (gastroesophageal reflux disease)    History of kidney stones    Hx of vaginal bleeding, resolved, ultrasound completed 10/11/2018   Hypertension    Personal history of atrial fibrillation, s/p ablation, now in NSR but remains on anticoaulation 11/26/2015   Followed in cardiology clinic     Past Surgical History:  Procedure Laterality Date   ATRIAL FIBRILLATION ABLATION N/A 09/30/2017   Procedure: ATRIAL FIBRILLATION ABLATION;  Surgeon: Thompson Grayer, MD;  Location: [facility];  Service: Cardiovascular;  Laterality: N/A;   CHOLECYSTECTOMY N/A 12/08/2018   Procedure: LAPAROSCOPIC CHOLECYSTECTOMY WITH INTRAOPERATIVE CHOLANGIOGRAM;  Surgeon: Jovita Kussmaul, MD;  Location: [facility];  Service: General;  Laterality: N/A;   ERCP N/A 12/09/2018   Procedure: ENDOSCOPIC RETROGRADE CHOLANGIOPANCREATOGRAPHY (ERCP);  Surgeon: Carol Ada, MD;  Location: [facility];  Service: Endoscopy;  Laterality: N/A;   SPHINCTEROTOMY  12/09/2018   Procedure: SPHINCTEROTOMY;  Surgeon: Carol Ada, MD;  Location[facility] ENDOSCOPY;  Service: Endoscopy;;   TONSILLECTOMY     TUBAL LIGATION      Social History:  reports that she quit smoking about 5 years ago. Her smoking use included  cigarettes. She has never used smokeless tobacco. She reports that she does not currently use alcohol. She reports that she does not currently use drugs after having used the following drugs: Marijuana.  Allergies:  Allergies  Allergen Reactions   Albuterol Palpitations    Caused afib    Percocet [Oxycodone-Acetaminophen] Shortness Of Breath, Itching and Anxiety    Makes jittery, difficulty breathing, itching   Celecoxib Rash   Protonix [Pantoprazole Sodium] Palpitations   Tramadol Palpitations    (Not in a hospital admission)    Physical Exam: Blood pressure (!) 150/102, pulse 92, temperature 98.6 F (37 C), temperature source Oral, resp.  rate (!) 25, height 5\' 7"  (1.702 m), weight 59.9 kg, SpO2 98 %. General: Pleasant elderly female laying on hospital bed, appears stated age, NAD. HEENT: head -normocephalic, atraumatic; Eyes: PERRLA, no conjunctival injection Neck- Trachea is midline, no thyromegaly or JVD appreciated.  CV- RRR, normal S1/S2, no M/R/G, radial and dorsalis pedis pulses 2+ BL Pulm- breathing is non-labored. CTABL diminished BL bases, no wheezes, rhales, rhonchi.  Abd- soft, mild distention, tender to light palpation of the LLQ and LUQ with voluntary guarding. There is no peritonitis. Laparoscopic port sites from previous cholecystectomy appear well healing - epigastric keloid. GU- external rectal exam shows a couple of small, hemorrhoids. There is visible bright red blood at the anus. MSK- UE/LE symmetrical, no cyanosis, clubbing, or edema. Neuro- CN II-XII grossly in tact, no paresthesias. Psych- Alert and Oriented x3 with appropriate affect Skin: warm and dry, no rashes or lesions   Results for orders placed or performed during the hospital encounter of 09/27/21 (from the past 48 hour(s))  CBC with Differential     Status: Abnormal   Collection Time: 09/27/21  7:23 AM  Result Value Ref Range   WBC 6.5 4.0 - 10.5 K/uL   RBC 3.28 (L) 3.87 - 5.11 MIL/uL   Hemoglobin 8.7 (L) 12.0 - 15.0 g/dL   HCT 26.6 (L) 36.0 - 46.0 %   MCV 81.1 80.0 - 100.0 fL   MCH 26.5 26.0 - 34.0 pg   MCHC 32.7 30.0 - 36.0 g/dL   RDW 16.3 (H) 11.5 - 15.5 %   Platelets 210 150 - 400 K/uL   nRBC 0.0 0.0 - 0.2 %   Neutrophils Relative % 69 %   Neutro Abs 4.4 1.7 - 7.7 K/uL   Lymphocytes Relative 18 %   Lymphs Abs 1.2 0.7 - 4.0 K/uL   Monocytes Relative 11 %   Monocytes Absolute 0.7 0.1 - 1.0 K/uL   Eosinophils Relative 2 %   Eosinophils Absolute 0.1 0.0 - 0.5 K/uL   Basophils Relative 0 %   Basophils Absolute 0.0 0.0 - 0.1 K/uL   Immature Granulocytes 0 %   Abs Immature Granulocytes 0.02 0.00 - 0.07 K/uL    Comment: Performed  at [facility], [address], [place], [place] [address]  Type and screen [place]     Status: None   Collection Time: 09/27/21  7:30 AM  Result Value Ref Range   ABO/RH(D) O POS    Antibody Screen NEG    Sample Expiration      09/30/2021,2359 Performed at [facility], [place] [address], [place], [place] [address]   Resp Panel by RT-PCR (Flu A&B, Covid) Nasopharyngeal Swab     Status: None   Collection Time: 09/27/21  7:49 AM   Specimen: Nasopharyngeal Swab; Nasopharyngeal(NP) swabs in vial transport medium  Result  Value Ref Range   SARS Coronavirus 2 by RT PCR NEGATIVE NEGATIVE    Comment: (NOTE) SARS-CoV-2 target nucleic acids are NOT DETECTED.  The SARS-CoV-2 RNA is generally detectable in upper respiratory specimens during the acute phase of infection. The lowest concentration of SARS-CoV-2 viral copies this assay can detect is 138 copies/mL. A negative result does not preclude SARS-Cov-2 infection and should not be used as the sole basis for treatment or other patient management decisions. A negative result may occur with  improper specimen collection/handling, submission of specimen other than nasopharyngeal swab, presence of viral mutation(s) within the areas targeted by this assay, and inadequate number of viral copies(<138 copies/mL). A negative result must be combined with clinical observations, patient history, and epidemiological information. The expected result is Negative.  Fact Sheet for Patients:  EntrepreneurPulse.com.au  Fact Sheet for Healthcare Providers:  IncredibleEmployment.be  This test is no t yet approved or cleared by the [place] FDA and  has been authorized for detection and/or diagnosis of SARS-CoV-2 by FDA under an Emergency Use Authorization (EUA). This EUA will remain  in effect (meaning this test can be used) for the duration of the COVID-19 declaration under Section  564(b)(1) of the Act, 21 U.S.C.section 360bbb-3(b)(1), unless the authorization is terminated  or revoked sooner.       Influenza A by PCR NEGATIVE NEGATIVE   Influenza B by PCR NEGATIVE NEGATIVE    Comment: (NOTE) The Xpert Xpress SARS-CoV-2/FLU/RSV plus assay is intended as an aid in the diagnosis of influenza from Nasopharyngeal swab specimens and should not be used as a sole basis for treatment. Nasal washings and aspirates are unacceptable for Xpert Xpress SARS-CoV-2/FLU/RSV testing.  Fact Sheet for Patients: EntrepreneurPulse.com.au  Fact Sheet for Healthcare Providers: IncredibleEmployment.be  This test is not yet approved or cleared by the [place] FDA and has been authorized for detection and/or diagnosis of SARS-CoV-2 by FDA under an Emergency Use Authorization (EUA). This EUA will remain in effect (meaning this test can be used) for the duration of the COVID-19 declaration under Section 564(b)(1) of the Act, 21 U.S.C. section 360bbb-3(b)(1), unless the authorization is terminated or revoked.  Performed at [facility], [place] [address] [place], [place] [address]   I-stat chem 8, ED     Status: Abnormal   Collection Time: 09/27/21  7:51 AM  Result Value Ref Range   Sodium 142 135 - 145 mmol/L   Potassium 2.8 (L) 3.5 - 5.1 mmol/L   Chloride 106 98 - 111 mmol/L   BUN 4 (L) 8 - 23 mg/dL   Creatinine, Ser 0.60 0.44 - 1.00 mg/dL   Glucose, Bld 99 70 - 99 mg/dL    Comment: Glucose reference range applies only to samples taken after fasting for at least 8 hours.   Calcium, Ion 1.16 1.15 - 1.40 mmol/L   TCO2 25 22 - 32 mmol/L   Hemoglobin 9.2 (L) 12.0 - 15.0 g/dL   HCT 27.0 (L) 36.0 - 46.0 %  Comprehensive metabolic panel     Status: Abnormal   Collection Time: 09/27/21  8:01 AM  Result Value Ref Range   Sodium 141 135 - 145 mmol/L   Potassium 2.8 (L) 3.5 - 5.1 mmol/L   Chloride 108 98 - 111 mmol/L   CO2 25 22 - 32  mmol/L   Glucose, Bld 98 70 - 99 mg/dL    Comment: Glucose reference range applies only to samples taken after fasting for at least 8 hours.  BUN 5 (L) 8 - 23 mg/dL   Creatinine, Ser 0.78 0.44 - 1.00 mg/dL   Calcium 9.0 8.9 - 10.3 mg/dL   Total Protein 6.4 (L) 6.5 - 8.1 g/dL   Albumin 3.3 (L) 3.5 - 5.0 g/dL   AST 17 15 - 41 U/L   ALT 15 0 - 44 U/L   Alkaline Phosphatase 58 38 - 126 U/L   Total Bilirubin 0.1 (L) 0.3 - 1.2 mg/dL   GFR, Estimated >60 >60 mL/min    Comment: (NOTE) Calculated using the CKD-EPI Creatinine Equation (2021)    Anion gap 8 5 - 15    Comment: Performed at [facility], [place] [address] [place], [place] [address]  Lipase, blood     Status: None   Collection Time: 09/27/21  8:01 AM  Result Value Ref Range   Lipase 34 11 - 51 U/L    Comment: Performed at [facility] [address], [place], [place] [address]  Lactic acid, plasma     Status: None   Collection Time: 09/27/21  8:09 AM  Result Value Ref Range   Lactic Acid, Venous 1.1 0.5 - 1.9 mmol/L    Comment: Performed at [facility] [address], [place], [place] [address]   CT Abdomen Pelvis W Contrast  Addendum Date: 09/27/2021   ADDENDUM REPORT: 09/27/2021 09:40 ADDENDUM: Study discussed by telephone with Dr. Marda Stalker on 09/27/2021 at 0926 hours. Electronically Signed   By: Genevie Ann M.D.   On: 09/27/2021 09:40   Result Date: 09/27/2021 CLINICAL DATA:  71 year old female with abdominal pain. Status post colonoscopy several days ago. EXAM: CT ABDOMEN AND PELVIS WITH CONTRAST TECHNIQUE: Multidetector CT imaging of the abdomen and pelvis was performed using the standard protocol following bolus administration of intravenous contrast. RADIATION DOSE REDUCTION: This exam was performed according to the departmental dose-optimization program which includes automated exposure control, adjustment of the mA and/or kV according to patient size and/or use of iterative reconstruction technique.  CONTRAST:  77mL OMNIPAQUE IOHEXOL 300 MG/ML  SOLN COMPARISON:  CT Abdomen and Pelvis 09/22/2021 and earlier. FINDINGS: Lower chest: Increased elevation of the right hemidiaphragm with posterolateral basal segment lung opacity indeterminate for atelectasis versus infection. 5 mm right lower lobe lung nodule on series 6, image 7 was subtle last month and present on a 2019 cardiac CT, stable and benign. No pericardial or pleural effusion. Mild left lung base atelectasis. Hepatobiliary: Stable multiple circumscribed hepatic cysts. Surgically absent gallbladder. Small linear area of inferior right lobe hypoenhancement appears unchanged since since last year and benign. Pancreas: Negative. Chronic enlargement of the pancreatic duct in the head and uncinate appears stable since last year. Spleen: Small volume of perisplenic fluid in cleat, including caudal to the spleen, with complex fluid density consistent with hemoperitoneum. At the same time, there is no discrete splenic laceration. No convincing splenic contusion. Adrenals/Urinary Tract: Adrenal glands and kidneys appear stable and within normal limits. Unremarkable bladder. Chronic pelvic phleboliths. No urinary calculus identified. Symmetric renal contrast excretion. Stomach/Bowel: Decompressed rectum and sigmoid colon now with a more normal appearance compared to 09/22/2021. Redundant sigmoid tracking into the left upper quadrant. Decompressed descending colon and splenic flexure. Redundant transverse colon with retained stool. Similar retained stool in the right colon. No acute large bowel wall thickening identified. Normal appendix suspected on coronal image 41. Negative terminal ileum. No dilated small bowel. Decompressed stomach. Duodenum is decompressed and does appear to cross midline although proximal small  bowel loops appear to be in the right upper quadrant. No free air. No free fluid identified in the mesentery. Vascular/Lymphatic: Extensive Aortoiliac  calcified atherosclerosis. Mildly tortuous abdominal aorta. Major arterial structures remain patent. Portal venous system is patent. No lymphadenopathy identified. Reproductive: Stable and within normal limits. Other: Complex fluid, hemoperitoneum in the pelvis, small to moderate volume (series 4, image 61) Musculoskeletal: Advanced lumbar disc and endplate degeneration redemonstrated. No acute or suspicious osseous abnormality identified. IMPRESSION: 1. Positive for Hemoperitoneum - about the spleen and layering in the pelvis, small to moderate volume. No free air or discrete bowel injury identified. Acute splenic injury felt to be the most likely source of bleeding in this setting, despite no obvious splenic laceration. 2. Redundant large bowel with retained stool. Regression of rectosigmoid colon wall thickening since last month. 3. Increased elevation of the right hemidiaphragm with right lung base opacity indeterminate for atelectasis versus infection. Right lower lobe lung nodule stable since 2019 and benign (no follow-up indicated). 4. Aortic Atherosclerosis (ICD10-I70.0). Electronically Signed: By: Genevie Ann M.D. On: 09/27/2021 09:15      Assessment/Plan Hemoperitoneum and bright red blood per rectum s/p colonoscopy 09/24/2021 This is a 71 year old female who presents with hemoperitoneum 3 days after her colonoscopy.  There is no pneumoperitoneum.  Per colonoscopy report, there were 2 polyps in the descending colon and 2 polyps in the ascending colon that were removed.  There was also erythematous mucosa in the rectum, sigmoid, descending, and transverse colon that was biopsied.  Path is pending during my exam her vital signs are stable, without tachycardia or hypotension.  Her hemoglobin is 8.7 from 10.7  3 days ago.  She has no peritonitis on exam.  I do not see any emergent indications for surgery at this time.  I recommend reversal of her anticoagulation, holding her anticoagulation, and monitoring her  abdominal exam.  Trend hemoglobins.  Clinical deterioration or signs of bleeding may warrant emergent exploratory laparotomy.  General surgery will follow.  --Below per internal medicine- Atrial fibrillation-on Xarelto, last dose 2/4, hold anticoagulation and give reversal agent Gastritis-noted on upper endoscopy also performed 09/24/2021, would continue Protonix twice daily, avoid NSAIDs Hypertension GERD Chronic constipation Asthma   FEN -NPO IVF, bowel rest VTE -SCDs, chemical VTE held in the setting of intra-abdominal bleeding and blood per rectum ID -none recommended from a general surgery perspective Admit -to the internal medicine service, GI consult is pending  [place], [facility] Surgery 09/27/2021, 10:37 AM Please see Amion for pager number during day hours 7:00am-4:30pm or 7:00am -11:30am on weekends

## 2021-09-27 NOTE — Consult Note (Signed)
Referring Provider:    Dr. Cain Sieve       Primary Care Physician:  Angelica Pou, MD Primary Gastroenterologist:   Dr. Havery Moros          Reason for Consultation:  Hemoperitoneum, rectal bleeding                 ASSESSMENT /  PLAN   Hemoperitoneum Anemia Constipation Rectal bleeding  Patient presents with ab pain after a recent hospitalization for diarrhea, lower ab pain, and N&V. It was thought during her prior hospitalization that much of her symptoms were due to underlying constipation. Due to a slight drop in her hemoglobin as well as rectal thickening seen on CT imaging, EGD and colonoscopy were performed that showed gastritis, two small colon polyps, left colon erythema, and hemorrhoids. At this time I have low suspicion for significant GI bleeding. It is likely that her rectal bleeding is due to hemorrhoids, exacerbated by constipation and the use of Xarelto. Her drop in blood counts is more likely due to hemoperitoneum. Her hemoperitoneum is likely due to bleeding from splenic injury. Her risk for bleeding was increased due to her ongoing use of Xarelto. On imaging there are no visible laceration or active extravasation from the spleen, suggesting that the spleen has likely healed. Patient still has significant stool burden on her CT scan so will need to monitor her constipation to make sure it does not significant worsen on opioid therapy - Trend Hb - Surgery following - Discussed with surgery and okay to advance to [facility] - Reversal agent for Xarelto administered. Might be worth discussing the risks and benefits of remaining on Xarelto therapy. - PPI BID - Follow up path results from EGD and colonoscopy - Stool softener and Miralax - Continue pain control per primary team   HPI:     [name] is a 71 y.o. female with history of A-fib on Xarelto and chronic constipation presents with abdominal pain and rectal bleeding. She has been having ab pain ever since her previous  hospitalization. She has been seeing rectal bleeding when she wipes with toilet paper. Denies any blood mixed in with the stools. Upon arrival to the ED, vital signs stable. Hb is slightly decreased from prior at 8.7 from 10.7. CT A/P showed hemoperitoneum near the spleen and in the pelvis, spleen without obvious splenic laceration but favored to the source of the hemoperitoneum, redundant large bowel with retained stool, regression of rectosigmoid colon.   Patient was recently admitted 09/21/21 to 09/24/21 for diarrhea, lower ab pain, and N&V. She had a slight drop in her Hb during that hospitalization and CT A/P showed some rectal wall thickening. Thus EGD and colonoscopy were performed that some gastritis and two small polyps that were resected. There was also scattered erythema in the left side of the colon that was biopsied. Biopsies are still pending.    Past Medical History:  Diagnosis Date   Asthma in adult, mild intermittent, uncomplicated    Chronic anticoagulation    Chronic constipation    Eczema    GERD (gastroesophageal reflux disease)    History of kidney stones    Hx of vaginal bleeding, resolved, ultrasound completed 10/11/2018   Hypertension    Personal history of atrial fibrillation, s/p ablation, now in NSR but remains on anticoaulation 11/26/2015   Followed in cardiology clinic     Past Surgical History:  Procedure Laterality Date   ATRIAL FIBRILLATION ABLATION N/A 09/30/2017   Procedure: ATRIAL  FIBRILLATION ABLATION;  Surgeon: Thompson Grayer, MD;  Location: [facility];  Service: Cardiovascular;  Laterality: N/A;   CHOLECYSTECTOMY N/A 12/08/2018   Procedure: LAPAROSCOPIC CHOLECYSTECTOMY WITH INTRAOPERATIVE CHOLANGIOGRAM;  Surgeon: Jovita Kussmaul, MD;  Location: [facility];  Service: General;  Laterality: N/A;   ERCP N/A 12/09/2018   Procedure: ENDOSCOPIC RETROGRADE CHOLANGIOPANCREATOGRAPHY (ERCP);  Surgeon: Carol Ada, MD;  Location: [facility];  Service: Endoscopy;   Laterality: N/A;   SPHINCTEROTOMY  12/09/2018   Procedure: SPHINCTEROTOMY;  Surgeon: Carol Ada, MD;  Location: [facility];  Service: Endoscopy;;   TONSILLECTOMY     TUBAL LIGATION      Prior to Admission medications   Medication Sig Start Date End Date Taking? Authorizing Provider  alendronate (FOSAMAX) 70 MG tablet Take 1 tablet (70 mg total) by mouth every 7 (seven) days. Take with a full glass of water on an empty stomach. 06/09/21 06/09/22  Angelica Pou, MD  atorvastatin (LIPITOR) 40 MG tablet Take 1 tablet (40 mg total) by mouth daily. 08/27/21 08/27/22  Angelica Pou, MD  cetirizine (ZYRTEC ALLERGY) 10 MG tablet Take 1 tablet (10 mg total) by mouth daily as needed for allergies. 07/31/21 07/31/22  Angelica Pou, MD  diltiazem (CARDIZEM) 60 MG tablet Take 1 tablet (60 mg total) by mouth every 6 (six) hours as needed (afib). 08/14/21   Sherran Needs, NP  diltiazem (CARTIA XT) 120 MG 24 hr capsule Take 1 capsule (120 mg total) by mouth 2 (two) times daily. 09/18/21   Sherran Needs, NP  docusate sodium (COLACE) 100 MG capsule Take 1 capsule (100 mg total) by mouth daily. Patient taking differently: Take 100 mg by mouth daily as needed for mild constipation. 06/15/21   Nuala Alpha A, PA-C  ezetimibe (ZETIA) 10 MG tablet Take 1 tablet (10 mg total) by mouth daily. 10/30/20 10/30/21  Angelica Pou, MD  famotidine (PEPCID) 10 MG tablet Take 10 mg by mouth 2 (two) times daily as needed for heartburn or indigestion.    [provider]  fluticasone (FLOVENT HFA) 44 MCG/ACT inhaler Inhale 1 puff into the lungs daily. 09/08/21   Angelica Pou, MD  HYDROcodone-acetaminophen (NORCO/VICODIN) 5-325 MG tablet Take 1/2 tab to 1 tab if needed for days when you are experiencing severe pain. Patient taking differently: Take 0.5-1 tablets by mouth every 4 (four) hours as needed for severe pain. 08/27/21   Angelica Pou, MD  hydrocortisone 1 % ointment Apply  topically 3 (three) times daily. 09/24/21   Farrel Gordon, DO  levalbuterol (XOPENEX HFA) 45 MCG/ACT inhaler INHALE 1 PUFF INTO THE LUNGS EVERY 6 HOURS AS NEEDED FOR SHORTNESS OF BREATH Patient taking differently: Inhale 1 puff into the lungs every 6 (six) hours as needed for shortness of breath. 09/14/20   Katsadouros, Vasilios, MD  losartan (COZAAR) 50 MG tablet Take 2 tablets (100 mg total) by mouth daily. 08/27/21 01/09/22  Angelica Pou, MD  montelukast (SINGULAIR) 10 MG tablet Take 1 tablet (10 mg total) by mouth at bedtime. 09/19/20   Angelica Pou, MD  omeprazole (PRILOSEC) 40 MG capsule Take 1 capsule (40 mg total) by mouth in the morning and at bedtime. 09/24/21   Farrel Gordon, DO  ondansetron (ZOFRAN-ODT) 4 MG disintegrating tablet Take 1 tablet (4 mg total) by mouth every 8 (eight) hours as needed for nausea or vomiting. 08/09/21   Quintella Reichert, MD  potassium chloride SA (KLOR-CON M) 10 MEQ tablet Take 1 tablet (10  mEq total) by mouth daily. 08/18/21   Madalyn Rob, MD  rivaroxaban (XARELTO) 20 MG TABS tablet Take 1 tablet (20 mg total) by mouth daily with supper. 09/18/21   Sherran Needs, NP  simethicone (MYLICON) 80 MG chewable tablet Chew 1 tablet (80 mg total) by mouth 4 (four) times daily as needed for flatulence. 09/24/21   Farrel Gordon, DO  spironolactone (ALDACTONE) 25 MG tablet Take 1 tablet (25 mg total) by mouth daily. 04/17/21 04/17/22  Angelica Pou, MD  sucralfate (CARAFATE) 1 g tablet Take 1 tablet (1 g total) by mouth 4 (four) times daily -  with meals and at bedtime. 04/05/21   Hayden Rasmussen, MD  tiotropium (SPIRIVA) 18 MCG inhalation capsule Place 1 capsule (18 mcg total) into inhaler and inhale daily. 08/05/21   Angelica Pou, MD    Current Facility-Administered Medications  Medication Dose Route Frequency Provider Last Rate Last Admin   acetaminophen (TYLENOL) tablet 650 mg  650 mg Oral Q6H PRN Rehman, Areeg N, DO       Or   acetaminophen  (TYLENOL) suppository 650 mg  650 mg Rectal Q6H PRN Rehman, Areeg N, DO       HYDROmorphone (DILAUDID) injection 1 mg  1 mg Intravenous Q4H PRN Rehman, Areeg N, DO       lactated ringers infusion   Intravenous Continuous Rehman, Areeg N, DO       levalbuterol (XOPENEX HFA) inhaler 1 puff  1 puff Inhalation Q6H PRN Rehman, Areeg N, DO       ondansetron (ZOFRAN) injection 4 mg  4 mg Intravenous Q8H PRN Rehman, Areeg N, DO       potassium chloride 10 mEq in 100 mL IVPB  10 mEq Intravenous Q1 Hr x 4 Rehman, Areeg N, DO 100 mL/hr at 09/27/21 1259 10 mEq at 09/27/21 1259   umeclidinium bromide (INCRUSE ELLIPTA) 62.5 MCG/ACT 1 puff  1 puff Inhalation Daily Lottie Mussel, MD        Allergies as of 09/27/2021 - Review Complete 09/27/2021  Allergen Reaction Noted   Albuterol Palpitations 09/20/2017   Percocet [oxycodone-acetaminophen] Shortness Of Breath, Itching, and Anxiety 12/07/2018   Celecoxib Rash 04/11/2007   Protonix [pantoprazole sodium] Palpitations 11/28/2015   Tramadol Palpitations 10/13/2020    Family History  Problem Relation Age of Onset   Heart attack Mother        Young age   Breast cancer Sister        Late 42s; now s/p mastectomy    Lung cancer Brother        x 2   Cancer Sister    Cancer Sister    Cancer Sister    Colon cancer Neg Hx    Colon polyps Neg Hx    Esophageal cancer Neg Hx    Rectal cancer Neg Hx    Stomach cancer Neg Hx     Social History   Tobacco Use   Smoking status: Former    Types: Cigarettes    Quit date: 10/24/2015    Years since quitting: 5.9   Smokeless tobacco: Never  Vaping Use   Vaping Use: Never used  Substance Use Topics   Alcohol use: Not Currently   Drug use: Not Currently    Types: Marijuana    Comment: Smoked marijuana in the past.    Review of Systems: All systems reviewed and negative except where noted in HPI.  Physical Exam: Vital signs in last 24 hours: Temp:  [98.6  F (37 C)] 98.6 F (37 C) (02/05 0715) Pulse  Rate:  [69-92] 72 (02/05 1315) Resp:  [12-25] 19 (02/05 1315) BP: (114-150)/(63-102) 118/71 (02/05 1315) SpO2:  [94 %-100 %] 96 % (02/05 1315) Weight:  [59.9 kg] 59.9 kg (02/05 0713)   General:   Awake, alert, NAD Psych:  Pleasant, cooperative Eyes:  Pupils equal, sclera clear, no icterus.    Neck:  Supple; no masses Lungs:  Clear throughout to auscultation.   No wheezes, crackles, or rhonchi.  Heart:  Regular rate and rhythm; no murmurs, no lower extremity edema Abdomen:  Soft, non-distended, tender to palpation in the LUQ Rectal:  Deferred  Msk:  Symmetrical without gross deformities. . Neurologic:  Alert and  oriented x4;  grossly normal neurologically. Skin:  Intact without significant lesions or rashes.   Intake/Output from previous day: No intake/output data recorded. Intake/Output this shift: No intake/output data recorded.  Lab Results: Recent Labs    09/27/21 0723 09/27/21 0751  WBC 6.5  --   HGB 8.7* 9.2*  HCT 26.6* 27.0*  PLT 210  --    BMET Recent Labs    09/27/21 0751 09/27/21 0801  NA 142 141  K 2.8* 2.8*  CL 106 108  CO2  --  25  GLUCOSE 99 98  BUN 4* 5*  CREATININE 0.60 0.78  CALCIUM  --  9.0   LFT Recent Labs    09/27/21 0801  PROT 6.4*  ALBUMIN 3.3*  AST 17  ALT 15  ALKPHOS 58  BILITOT 0.1*   PT/INR No results for input(s): LABPROT, INR in the last 72 hours. Hepatitis Panel No results for input(s): HEPBSAG, HCVAB, HEPAIGM, HEPBIGM in the last 72 hours.   . CBC Latest Ref Rng & Units 09/27/2021 09/27/2021 09/24/2021  WBC 4.0 - 10.5 K/uL - 6.5 10.0  Hemoglobin 12.0 - 15.0 g/dL 9.2(L) 8.7(L) 10.7(L)  Hematocrit 36.0 - 46.0 % 27.0(L) 26.6(L) 31.8(L)  Platelets 150 - 400 K/uL - 210 280    . CMP Latest Ref Rng & Units 09/27/2021 09/27/2021 09/24/2021  Glucose 70 - 99 mg/dL 98 99 114(H)  BUN 8 - 23 mg/dL 5(L) 4(L) 14  Creatinine 0.44 - 1.00 mg/dL 0.78 0.60 1.13(H)  Sodium 135 - 145 mmol/L 141 142 141  Potassium 3.5 - 5.1 mmol/L 2.8(L)  2.8(L) 3.7  Chloride 98 - 111 mmol/L 108 106 108  CO2 22 - 32 mmol/L 25 - 21(L)  Calcium 8.9 - 10.3 mg/dL 9.0 - 9.4  Total Protein 6.5 - 8.1 g/dL 6.4(L) - -  Total Bilirubin 0.3 - 1.2 mg/dL 0.1(L) - -  Alkaline Phos 38 - 126 U/L 58 - -  AST 15 - 41 U/L 17 - -  ALT 0 - 44 U/L 15 - -   Studies/Results: CT Abdomen Pelvis W Contrast  Addendum Date: 09/27/2021   ADDENDUM REPORT: 09/27/2021 09:40 ADDENDUM: Study discussed by telephone with Dr. Marda Stalker on 09/27/2021 at 0926 hours. Electronically Signed   By: Genevie Ann M.D.   On: 09/27/2021 09:40   Result Date: 09/27/2021 CLINICAL DATA:  71 year old female with abdominal pain. Status post colonoscopy several days ago. EXAM: CT ABDOMEN AND PELVIS WITH CONTRAST TECHNIQUE: Multidetector CT imaging of the abdomen and pelvis was performed using the standard protocol following bolus administration of intravenous contrast. RADIATION DOSE REDUCTION: This exam was performed according to the departmental dose-optimization program which includes automated exposure control, adjustment of the mA and/or kV according to patient size and/or use of iterative  reconstruction technique. CONTRAST:  45mL OMNIPAQUE IOHEXOL 300 MG/ML  SOLN COMPARISON:  CT Abdomen and Pelvis 09/22/2021 and earlier. FINDINGS: Lower chest: Increased elevation of the right hemidiaphragm with posterolateral basal segment lung opacity indeterminate for atelectasis versus infection. 5 mm right lower lobe lung nodule on series 6, image 7 was subtle last month and present on a 2019 cardiac CT, stable and benign. No pericardial or pleural effusion. Mild left lung base atelectasis. Hepatobiliary: Stable multiple circumscribed hepatic cysts. Surgically absent gallbladder. Small linear area of inferior right lobe hypoenhancement appears unchanged since since last year and benign. Pancreas: Negative. Chronic enlargement of the pancreatic duct in the head and uncinate appears stable since last year. Spleen:  Small volume of perisplenic fluid in cleat, including caudal to the spleen, with complex fluid density consistent with hemoperitoneum. At the same time, there is no discrete splenic laceration. No convincing splenic contusion. Adrenals/Urinary Tract: Adrenal glands and kidneys appear stable and within normal limits. Unremarkable bladder. Chronic pelvic phleboliths. No urinary calculus identified. Symmetric renal contrast excretion. Stomach/Bowel: Decompressed rectum and sigmoid colon now with a more normal appearance compared to 09/22/2021. Redundant sigmoid tracking into the left upper quadrant. Decompressed descending colon and splenic flexure. Redundant transverse colon with retained stool. Similar retained stool in the right colon. No acute large bowel wall thickening identified. Normal appendix suspected on coronal image 41. Negative terminal ileum. No dilated small bowel. Decompressed stomach. Duodenum is decompressed and does appear to cross midline although proximal small bowel loops appear to be in the right upper quadrant. No free air. No free fluid identified in the mesentery. Vascular/Lymphatic: Extensive Aortoiliac calcified atherosclerosis. Mildly tortuous abdominal aorta. Major arterial structures remain patent. Portal venous system is patent. No lymphadenopathy identified. Reproductive: Stable and within normal limits. Other: Complex fluid, hemoperitoneum in the pelvis, small to moderate volume (series 4, image 61) Musculoskeletal: Advanced lumbar disc and endplate degeneration redemonstrated. No acute or suspicious osseous abnormality identified. IMPRESSION: 1. Positive for Hemoperitoneum - about the spleen and layering in the pelvis, small to moderate volume. No free air or discrete bowel injury identified. Acute splenic injury felt to be the most likely source of bleeding in this setting, despite no obvious splenic laceration. 2. Redundant large bowel with retained stool. Regression of  rectosigmoid colon wall thickening since last month. 3. Increased elevation of the right hemidiaphragm with right lung base opacity indeterminate for atelectasis versus infection. Right lower lobe lung nodule stable since 2019 and benign (no follow-up indicated). 4. Aortic Atherosclerosis (ICD10-I70.0). Electronically Signed: By: Genevie Ann M.D. On: 09/27/2021 09:15    Principal Problem:   Hemoperitoneum    Christia Reading, M.D. @  09/27/2021, 1:38 PM

## 2021-09-27 NOTE — ED Provider Notes (Signed)
[facility] EMERGENCY DEPARTMENT Provider Note   CSN: [id number] Arrival date & time: 09/27/21  6073     History  Chief Complaint  Patient presents with   Rectal Bleeding    [name] is a 71 y.o. female.  71 yo woman presents with left-sided abdominal pain and continued GI bleed. She was admitted last week in setting of GI bleed while taking linzess and for constipation (on oxycodone for chronic pain). She is also on xarelto for a. Fib which was held during admission, and restarted on discharge 2/2. She had an EGD/colonoscopy on 2/2 that showed gastric erosions (presumed cause of GI bleed), and 2 sessile polyps removed with coldsnare. Patient reports that she had left-sided pain after waking up from the colonoscopy that has become increasingly worse. She used the bathroom this AM and had about "1/2 a pad worth" of dark red blood, no clots, along with cramping. She has been able to eat and drink normally.      Home Medications Prior to Admission medications   Medication Sig Start Date End Date Taking? Authorizing Provider  alendronate (FOSAMAX) 70 MG tablet Take 1 tablet (70 mg total) by mouth every 7 (seven) days. Take with a full glass of water on an empty stomach. 06/09/21 06/09/22  Angelica Pou, MD  atorvastatin (LIPITOR) 40 MG tablet Take 1 tablet (40 mg total) by mouth daily. 08/27/21 08/27/22  Angelica Pou, MD  cetirizine (ZYRTEC ALLERGY) 10 MG tablet Take 1 tablet (10 mg total) by mouth daily as needed for allergies. 07/31/21 07/31/22  Angelica Pou, MD  diltiazem (CARDIZEM) 60 MG tablet Take 1 tablet (60 mg total) by mouth every 6 (six) hours as needed (afib). 08/14/21   Sherran Needs, NP  diltiazem (CARTIA XT) 120 MG 24 hr capsule Take 1 capsule (120 mg total) by mouth 2 (two) times daily. 09/18/21   Sherran Needs, NP  docusate sodium (COLACE) 100 MG capsule Take 1 capsule (100 mg total) by mouth daily. Patient taking differently:  Take 100 mg by mouth daily as needed for mild constipation. 06/15/21   Nuala Alpha A, PA-C  ezetimibe (ZETIA) 10 MG tablet Take 1 tablet (10 mg total) by mouth daily. 10/30/20 10/30/21  Angelica Pou, MD  famotidine (PEPCID) 10 MG tablet Take 10 mg by mouth 2 (two) times daily as needed for heartburn or indigestion.    [provider]  fluticasone (FLOVENT HFA) 44 MCG/ACT inhaler Inhale 1 puff into the lungs daily. 09/08/21   Angelica Pou, MD  HYDROcodone-acetaminophen (NORCO/VICODIN) 5-325 MG tablet Take 1/2 tab to 1 tab if needed for days when you are experiencing severe pain. Patient taking differently: Take 0.5-1 tablets by mouth every 4 (four) hours as needed for severe pain. 08/27/21   Angelica Pou, MD  hydrocortisone 1 % ointment Apply topically 3 (three) times daily. 09/24/21   Farrel Gordon, DO  levalbuterol (XOPENEX HFA) 45 MCG/ACT inhaler INHALE 1 PUFF INTO THE LUNGS EVERY 6 HOURS AS NEEDED FOR SHORTNESS OF BREATH Patient taking differently: Inhale 1 puff into the lungs every 6 (six) hours as needed for shortness of breath. 09/14/20   Katsadouros, Vasilios, MD  losartan (COZAAR) 50 MG tablet Take 2 tablets (100 mg total) by mouth daily. 08/27/21 01/09/22  Angelica Pou, MD  montelukast (SINGULAIR) 10 MG tablet Take 1 tablet (10 mg total) by mouth at bedtime. 09/19/20   Angelica Pou, MD  omeprazole (PRILOSEC) 40 MG  capsule Take 1 capsule (40 mg total) by mouth in the morning and at bedtime. 09/24/21   Farrel Gordon, DO  ondansetron (ZOFRAN-ODT) 4 MG disintegrating tablet Take 1 tablet (4 mg total) by mouth every 8 (eight) hours as needed for nausea or vomiting. 08/09/21   Quintella Reichert, MD  potassium chloride SA (KLOR-CON M) 10 MEQ tablet Take 1 tablet (10 mEq total) by mouth daily. 08/18/21   Madalyn Rob, MD  rivaroxaban (XARELTO) 20 MG TABS tablet Take 1 tablet (20 mg total) by mouth daily with supper. 09/18/21   Sherran Needs, NP  simethicone  (MYLICON) 80 MG chewable tablet Chew 1 tablet (80 mg total) by mouth 4 (four) times daily as needed for flatulence. 09/24/21   Farrel Gordon, DO  spironolactone (ALDACTONE) 25 MG tablet Take 1 tablet (25 mg total) by mouth daily. 04/17/21 04/17/22  Angelica Pou, MD  sucralfate (CARAFATE) 1 g tablet Take 1 tablet (1 g total) by mouth 4 (four) times daily -  with meals and at bedtime. 04/05/21   Hayden Rasmussen, MD  tiotropium (SPIRIVA) 18 MCG inhalation capsule Place 1 capsule (18 mcg total) into inhaler and inhale daily. 08/05/21   Angelica Pou, MD      Allergies    Albuterol, Percocet [oxycodone-acetaminophen], Celecoxib, Protonix [pantoprazole sodium], and Tramadol    Review of Systems   Review of Systems  Constitutional:  Negative for activity change, appetite change, fatigue and fever.  Respiratory:  Negative for cough and shortness of breath.   Cardiovascular:  Negative for chest pain.  Gastrointestinal:  Positive for abdominal pain and anal bleeding. Negative for constipation, diarrhea, nausea and vomiting.  All other systems reviewed and are negative.  Physical Exam Updated Vital Signs BP 124/71    Pulse 77    Temp 98.6 F (37 C) (Oral)    Resp (!) 21    Ht 5\' 7"  (1.702 m)    Wt 59.9 kg    SpO2 96%    BMI 20.67 kg/m  Physical Exam Vitals and nursing note reviewed.  Constitutional:      Appearance: Normal appearance.     Comments: Thin-appearing  HENT:     Head: Normocephalic and atraumatic.     Nose: Nose normal.     Mouth/Throat:     Mouth: Mucous membranes are moist.     Pharynx: Oropharynx is clear. No oropharyngeal exudate or posterior oropharyngeal erythema.  Eyes:     General: No scleral icterus.    Conjunctiva/sclera: Conjunctivae normal.     Pupils: Pupils are equal, round, and reactive to light.  Cardiovascular:     Rate and Rhythm: Normal rate and regular rhythm.     Pulses: Normal pulses.     Heart sounds: Normal heart sounds. No murmur heard.    No friction rub. No gallop.  Pulmonary:     Effort: Pulmonary effort is normal.     Breath sounds: Normal breath sounds.  Abdominal:     General: Abdomen is flat. Bowel sounds are normal. There is no distension.     Palpations: Abdomen is soft. There is no mass.     Tenderness: There is abdominal tenderness. There is guarding.     Comments: Guarding with stethoscope  Musculoskeletal:        General: Normal range of motion.  Skin:    General: Skin is warm and dry.     Capillary Refill: Capillary refill takes less than 2 seconds.  Neurological:  General: No focal deficit present.     Mental Status: She is alert and oriented to person, place, and time. Mental status is at baseline.  Psychiatric:        Mood and Affect: Mood normal.        Behavior: Behavior normal.    ED Results / Procedures / Treatments   Labs (all labs ordered are listed, but only abnormal results are displayed) Labs Reviewed  CBC WITH DIFFERENTIAL/PLATELET - Abnormal; Notable for the following components:      Result Value   RBC 3.28 (*)    Hemoglobin 8.7 (*)    HCT 26.6 (*)    RDW 16.3 (*)    All other components within normal limits  COMPREHENSIVE METABOLIC PANEL - Abnormal; Notable for the following components:   Potassium 2.8 (*)    BUN 5 (*)    Total Protein 6.4 (*)    Albumin 3.3 (*)    Total Bilirubin 0.1 (*)    All other components within normal limits  I-STAT CHEM 8, ED - Abnormal; Notable for the following components:   Potassium 2.8 (*)    BUN 4 (*)    Hemoglobin 9.2 (*)    HCT 27.0 (*)    All other components within normal limits  RESP PANEL BY RT-PCR (FLU A&B, COVID) ARPGX2  LACTIC ACID, PLASMA  LIPASE, BLOOD  TYPE AND SCREEN    EKG EKG Interpretation  Date/Time:  Sunday September 27 2021 07:15:08 EST Ventricular Rate:  78 PR Interval:    QRS Duration: 92 QT Interval:  386 QTC Calculation: 440 R Axis:   44 Text Interpretation: when compared to prior, more artifact. No STEMI  Confirmed by Antony Blackbird [phone]) on 09/27/2021 7:38:21 AM  Radiology CT Abdomen Pelvis W Contrast  Addendum Date: 09/27/2021   ADDENDUM REPORT: 09/27/2021 09:40 ADDENDUM: Study discussed by telephone with Dr. Marda Stalker on 09/27/2021 at 0926 hours. Electronically Signed   By: Genevie Ann M.D.   On: 09/27/2021 09:40   Result Date: 09/27/2021 CLINICAL DATA:  71 year old female with abdominal pain. Status post colonoscopy several days ago. EXAM: CT ABDOMEN AND PELVIS WITH CONTRAST TECHNIQUE: Multidetector CT imaging of the abdomen and pelvis was performed using the standard protocol following bolus administration of intravenous contrast. RADIATION DOSE REDUCTION: This exam was performed according to the departmental dose-optimization program which includes automated exposure control, adjustment of the mA and/or kV according to patient size and/or use of iterative reconstruction technique. CONTRAST:  72mL OMNIPAQUE IOHEXOL 300 MG/ML  SOLN COMPARISON:  CT Abdomen and Pelvis 09/22/2021 and earlier. FINDINGS: Lower chest: Increased elevation of the right hemidiaphragm with posterolateral basal segment lung opacity indeterminate for atelectasis versus infection. 5 mm right lower lobe lung nodule on series 6, image 7 was subtle last month and present on a 2019 cardiac CT, stable and benign. No pericardial or pleural effusion. Mild left lung base atelectasis. Hepatobiliary: Stable multiple circumscribed hepatic cysts. Surgically absent gallbladder. Small linear area of inferior right lobe hypoenhancement appears unchanged since since last year and benign. Pancreas: Negative. Chronic enlargement of the pancreatic duct in the head and uncinate appears stable since last year. Spleen: Small volume of perisplenic fluid in cleat, including caudal to the spleen, with complex fluid density consistent with hemoperitoneum. At the same time, there is no discrete splenic laceration. No convincing splenic contusion.  Adrenals/Urinary Tract: Adrenal glands and kidneys appear stable and within normal limits. Unremarkable bladder. Chronic pelvic phleboliths. No urinary calculus identified. Symmetric renal  contrast excretion. Stomach/Bowel: Decompressed rectum and sigmoid colon now with a more normal appearance compared to 09/22/2021. Redundant sigmoid tracking into the left upper quadrant. Decompressed descending colon and splenic flexure. Redundant transverse colon with retained stool. Similar retained stool in the right colon. No acute large bowel wall thickening identified. Normal appendix suspected on coronal image 41. Negative terminal ileum. No dilated small bowel. Decompressed stomach. Duodenum is decompressed and does appear to cross midline although proximal small bowel loops appear to be in the right upper quadrant. No free air. No free fluid identified in the mesentery. Vascular/Lymphatic: Extensive Aortoiliac calcified atherosclerosis. Mildly tortuous abdominal aorta. Major arterial structures remain patent. Portal venous system is patent. No lymphadenopathy identified. Reproductive: Stable and within normal limits. Other: Complex fluid, hemoperitoneum in the pelvis, small to moderate volume (series 4, image 61) Musculoskeletal: Advanced lumbar disc and endplate degeneration redemonstrated. No acute or suspicious osseous abnormality identified. IMPRESSION: 1. Positive for Hemoperitoneum - about the spleen and layering in the pelvis, small to moderate volume. No free air or discrete bowel injury identified. Acute splenic injury felt to be the most likely source of bleeding in this setting, despite no obvious splenic laceration. 2. Redundant large bowel with retained stool. Regression of rectosigmoid colon wall thickening since last month. 3. Increased elevation of the right hemidiaphragm with right lung base opacity indeterminate for atelectasis versus infection. Right lower lobe lung nodule stable since 2019 and benign  (no follow-up indicated). 4. Aortic Atherosclerosis (ICD10-I70.0). Electronically Signed: By: Genevie Ann M.D. On: 09/27/2021 09:15    Procedures Procedures    Medications Ordered in ED Medications  levalbuterol (XOPENEX HFA) inhaler 1 puff (has no administration in time range)  acetaminophen (TYLENOL) tablet 650 mg (has no administration in time range)    Or  acetaminophen (TYLENOL) suppository 650 mg (has no administration in time range)  lactated ringers infusion (has no administration in time range)  HYDROmorphone (DILAUDID) injection 1 mg (has no administration in time range)  ondansetron (ZOFRAN) injection 4 mg (has no administration in time range)  prothrombin complex conc human (KCENTRA) IVPB 3,259 Units (3,259 Units Intravenous New Bag/Given 09/27/21 1117)  umeclidinium bromide (INCRUSE ELLIPTA) 62.5 MCG/ACT 1 puff (has no administration in time range)  morphine (PF) 2 MG/ML injection 2 mg (2 mg Intravenous Given 09/27/21 0830)  ondansetron (ZOFRAN) injection 4 mg (4 mg Intravenous Given 09/27/21 0829)  iohexol (OMNIPAQUE) 300 MG/ML solution 80 mL (80 mLs Intravenous Contrast Given 09/27/21 0843)  HYDROmorphone (DILAUDID) injection 1 mg (1 mg Intravenous Given 09/27/21 1015)    ED Course/ Medical Decision Making/ A&P Clinical Course as of 09/27/21 [place] Sep 27, 2021  0757 Hgb 10.7 down to 8.7 from 2/2 until today. Cr improved from discharge, but significant hypokalemia, will replete. [CM]  9937 CT a/p with hemoperitoneum likely from splenic injury per radiology, no active hemoextravasation. Will hold xarelto, discuss with Gen Sg, GI, and internal medicine for admission. [CM]  63 Spoke with Gen Sg, who will see patient and follow if needed. She is currently hemodynamically stable. [CM]  1000 Discussed with internal medicine teaching service, they will admit. [CM]  1117 Discussed with Dr. Lorenso Courier, GI, she will see patient and follow. [CM]    Clinical Course User Index [CM] Gladys Damme, MD                           Medical Decision Making 71 yo woman with h/o recent GI  bleed s/p colonoscopy on 2/2, on xarelto, now with continued GI bleed and left-sided abdominal pain. Will obtain basic labs to assess level of anemia. With level of pain, patient could have rare complication of colonoscopy including contusion of spleen or perforation of colon. Will obtain CT a/p with contrast.  Amount and/or Complexity of Data Reviewed External Data Reviewed: labs and notes. Labs: ordered. Radiology: ordered. ECG/medicine tests: ordered.  Risk Prescription drug management. Decision regarding hospitalization.         Final Clinical Impression(s) / ED Diagnoses Final diagnoses:  Hemoperitoneum    Rx / DC Orders ED Discharge Orders     None         Gladys Damme, MD 09/27/21 1132    Tegeler, Gwenyth Allegra, MD 09/28/21 1205

## 2021-09-27 NOTE — Progress Notes (Signed)
Patient arrived to unit and appears calm + free of pain. Patient assessed; no wounds to note. Telemonitoring connections attached per unit policy. Patient oriented to unit. Sodium chloride administration stopped; Continuous LR administered. During Potassium K administration, patient complained of pain at IV site. K stopped briefly and patient assessed for pain. Dilaudid 1mg  given. Patient K restarted; patient complained of nausea soon after administration. Emesis bag given + Zofran 4mg  given. Patient prefers to stop K administration due to noticeably symptoms. MD notified. Report to be given to oncoming RN. Will continue to monitor

## 2021-09-28 ENCOUNTER — Encounter (HOSPITAL_COMMUNITY): Payer: Self-pay | Admitting: Internal Medicine

## 2021-09-28 ENCOUNTER — Telehealth: Payer: Self-pay | Admitting: *Deleted

## 2021-09-28 DIAGNOSIS — K219 Gastro-esophageal reflux disease without esophagitis: Secondary | ICD-10-CM

## 2021-09-28 DIAGNOSIS — K5904 Chronic idiopathic constipation: Secondary | ICD-10-CM

## 2021-09-28 DIAGNOSIS — E876 Hypokalemia: Secondary | ICD-10-CM

## 2021-09-28 DIAGNOSIS — S36039A Unspecified laceration of spleen, initial encounter: Secondary | ICD-10-CM

## 2021-09-28 DIAGNOSIS — S36039D Unspecified laceration of spleen, subsequent encounter: Secondary | ICD-10-CM

## 2021-09-28 DIAGNOSIS — K921 Melena: Secondary | ICD-10-CM

## 2021-09-28 DIAGNOSIS — J45909 Unspecified asthma, uncomplicated: Secondary | ICD-10-CM

## 2021-09-28 DIAGNOSIS — I4891 Unspecified atrial fibrillation: Secondary | ICD-10-CM

## 2021-09-28 LAB — BASIC METABOLIC PANEL
Anion gap: 11 (ref 5–15)
BUN: <5 mg/dL — ABNORMAL LOW (ref 8–23)
CO2: 24 mmol/L (ref 22–32)
Calcium: 8.8 mg/dL — ABNORMAL LOW (ref 8.9–10.3)
Chloride: 100 mmol/L (ref 98–111)
Creatinine, Ser: 0.68 mg/dL (ref 0.44–1.00)
GFR, Estimated: >60 mL/min (ref >60)
Glucose, Bld: 86 mg/dL (ref 70–99)
Potassium: 3 mmol/L — ABNORMAL LOW (ref 3.5–5.1)
Sodium: 135 mmol/L (ref 135–145)

## 2021-09-28 LAB — CBC WITH DIFFERENTIAL/PLATELET
Abs Immature Granulocytes: 0.02 10*3/uL (ref 0.00–0.07)
Basophils Absolute: 0 10*3/uL (ref 0.0–0.1)
Basophils Relative: 0 %
Eosinophils Absolute: 0.1 10*3/uL (ref 0.0–0.5)
Eosinophils Relative: 2 %
HCT: 25.5 % — ABNORMAL LOW (ref 36.0–46.0)
Hemoglobin: 8.3 g/dL — ABNORMAL LOW (ref 12.0–15.0)
Immature Granulocytes: 0 %
Lymphocytes Relative: 22 %
Lymphs Abs: 1.3 10*3/uL (ref 0.7–4.0)
MCH: 26.5 pg (ref 26.0–34.0)
MCHC: 32.5 g/dL (ref 30.0–36.0)
MCV: 81.5 fL (ref 80.0–100.0)
Monocytes Absolute: 0.7 10*3/uL (ref 0.1–1.0)
Monocytes Relative: 12 %
Neutro Abs: 3.9 10*3/uL (ref 1.7–7.7)
Neutrophils Relative %: 64 %
Platelets: 212 10*3/uL (ref 150–400)
RBC: 3.13 MIL/uL — ABNORMAL LOW (ref 3.87–5.11)
RDW: 16.7 % — ABNORMAL HIGH (ref 11.5–15.5)
WBC: 6.2 10*3/uL (ref 4.0–10.5)
nRBC: 0 % (ref 0.0–0.2)

## 2021-09-28 MED ORDER — SENNOSIDES-DOCUSATE SODIUM 8.6-50 MG PO TABS
1.0000 | ORAL_TABLET | Freq: Two times a day (BID) | ORAL | Status: DC
  Administered 2021-09-28 – 2021-09-29 (×3): 1 via ORAL
  Filled 2021-09-28 (×3): qty 1

## 2021-09-28 MED ORDER — POTASSIUM CHLORIDE CRYS ER 20 MEQ PO TBCR
40.0000 meq | EXTENDED_RELEASE_TABLET | Freq: Two times a day (BID) | ORAL | Status: AC
  Administered 2021-09-28: 40 meq via ORAL
  Filled 2021-09-28: qty 2

## 2021-09-28 MED ORDER — SPIRONOLACTONE 25 MG PO TABS
25.0000 mg | ORAL_TABLET | Freq: Every day | ORAL | Status: DC
  Administered 2021-09-28 – 2021-09-29 (×2): 25 mg via ORAL
  Filled 2021-09-28 (×2): qty 1

## 2021-09-28 MED ORDER — EZETIMIBE 10 MG PO TABS
10.0000 mg | ORAL_TABLET | Freq: Every day | ORAL | Status: DC
  Administered 2021-09-28 – 2021-09-29 (×2): 10 mg via ORAL
  Filled 2021-09-28 (×2): qty 1

## 2021-09-28 MED ORDER — DILTIAZEM HCL ER COATED BEADS 120 MG PO CP24
120.0000 mg | ORAL_CAPSULE | Freq: Two times a day (BID) | ORAL | Status: DC
  Administered 2021-09-28 – 2021-09-29 (×3): 120 mg via ORAL
  Filled 2021-09-28 (×3): qty 1

## 2021-09-28 MED ORDER — LACTATED RINGERS IV SOLN: INTRAVENOUS | Status: AC

## 2021-09-28 MED ORDER — MONTELUKAST SODIUM 10 MG PO TABS
10.0000 mg | ORAL_TABLET | Freq: Every day | ORAL | Status: DC
  Administered 2021-09-28: 10 mg via ORAL
  Filled 2021-09-28: qty 1

## 2021-09-28 MED ORDER — LOSARTAN POTASSIUM 50 MG PO TABS
100.0000 mg | ORAL_TABLET | Freq: Every day | ORAL | Status: DC
  Administered 2021-09-29: 100 mg via ORAL
  Filled 2021-09-28: qty 2

## 2021-09-28 MED ORDER — POTASSIUM CHLORIDE CRYS ER 20 MEQ PO TBCR
40.0000 meq | EXTENDED_RELEASE_TABLET | Freq: Two times a day (BID) | ORAL | Status: DC
  Administered 2021-09-28: 40 meq via ORAL
  Filled 2021-09-28: qty 2

## 2021-09-28 MED ORDER — HYDROCORTISONE (PERIANAL) 2.5 % EX CREA
TOPICAL_CREAM | Freq: Two times a day (BID) | CUTANEOUS | Status: DC
  Administered 2021-09-28: 1 via RECTAL
  Filled 2021-09-28: qty 28.35

## 2021-09-28 MED ORDER — PANTOPRAZOLE SODIUM 40 MG IV SOLR
40.0000 mg | Freq: Two times a day (BID) | INTRAVENOUS | Status: DC
  Administered 2021-09-28 – 2021-09-29 (×3): 40 mg via INTRAVENOUS
  Filled 2021-09-28: qty 10
  Filled 2021-09-28 (×2): qty 40

## 2021-09-28 NOTE — Progress Notes (Signed)
°  Transition of Care [facility]) Screening Note   Patient Details  Name: [name] Date of Birth: 1950-08-26   Transition of Care [facility]) CM/SW Contact:    Cyndi Bender, RN Phone Number: 09/28/2021, 8:15 AM    Transition of Care Department [facility]) has reviewed patient and no TOC needs have been identified at this time. We will continue to monitor patient advancement through interdisciplinary progression rounds. If new patient transition needs arise, please place a TOC consult.

## 2021-09-28 NOTE — Progress Notes (Signed)
[facility] Progress Note     Subjective: CC:  Reports abd pain about the same - not worse. Left sided pain that is worse with movement, improves w/ pain medication. Reports some flatus, no BM since shes been here. Denies rectal bleeding. Reports belching and having some acid reflux, maybe slightly worse than baseline.  Objective: Vital signs in last 24 hours: Temp:  [98 F (36.7 C)-98.4 F (36.9 C)] 98 F (36.7 C) (02/06 0814) Pulse Rate:  [70-92] 78 (02/06 0816) Resp:  [12-25] 18 (02/06 0816) BP: (114-150)/(63-102) 134/71 (02/06 0814) SpO2:  [93 %-100 %] 94 % (02/06 0816) Last BM Date: 09/24/21  Intake/Output from previous day: 02/05 0701 - 02/06 0700 In: 818.9 [P.O.:480; I.V.:92.4; IV Piggyback:246.5] Out: -  Intake/Output this shift: No intake/output data recorded.  PE: Gen:  Alert, NAD, pleasant Card:  Regular rate and rhythm, pedal pulses 2+ BL Pulm:  Normal effort, clear to auscultation bilaterally Abd: Soft, mild distention, voluntary guarding, TTP left hemi-abdomen, no peritonitis  Skin: warm and dry, no rashes  Psych: A&Ox3   Lab Results:  Recent Labs    09/27/21 0723 09/27/21 0751 09/28/21 0320  WBC 6.5  --  6.2  HGB 8.7* 9.2* 8.3*  HCT 26.6* 27.0* 25.5*  PLT 210  --  212   BMET Recent Labs    09/27/21 0801 09/28/21 0320  NA 141 135  K 2.8* 3.0*  CL 108 100  CO2 25 24  GLUCOSE 98 86  BUN 5* <5*  CREATININE 0.78 0.68  CALCIUM 9.0 8.8*   PT/INR No results for input(s): LABPROT, INR in the last 72 hours. CMP     Component Value Date/Time   NA 135 09/28/2021 0320   NA 141 10/30/2020 1008   K 3.0 (L) 09/28/2021 0320   CL 100 09/28/2021 0320   CO2 24 09/28/2021 0320   GLUCOSE 86 09/28/2021 0320   BUN <5 (L) 09/28/2021 0320   BUN 7 (L) 10/30/2020 1008   CREATININE 0.68 09/28/2021 0320   CALCIUM 8.8 (L) 09/28/2021 0320   PROT 6.4 (L) 09/27/2021 0801   ALBUMIN 3.3 (L) 09/27/2021 0801   AST 17 09/27/2021 0801   ALT 15  09/27/2021 0801   ALKPHOS 58 09/27/2021 0801   BILITOT 0.1 (L) 09/27/2021 0801   GFRNONAA >60 09/28/2021 0320   GFRAA 104 07/24/2020 1121   Lipase     Component Value Date/Time   LIPASE 34 09/27/2021 0801       Studies/Results: CT Abdomen Pelvis W Contrast  Addendum Date: 09/27/2021   ADDENDUM REPORT: 09/27/2021 09:40 ADDENDUM: Study discussed by telephone with Dr. Marda Stalker on 09/27/2021 at 0926 hours. Electronically Signed   By: Genevie Ann M.D.   On: 09/27/2021 09:40   Result Date: 09/27/2021 CLINICAL DATA:  71 year old female with abdominal pain. Status post colonoscopy several days ago. EXAM: CT ABDOMEN AND PELVIS WITH CONTRAST TECHNIQUE: Multidetector CT imaging of the abdomen and pelvis was performed using the standard protocol following bolus administration of intravenous contrast. RADIATION DOSE REDUCTION: This exam was performed according to the departmental dose-optimization program which includes automated exposure control, adjustment of the mA and/or kV according to patient size and/or use of iterative reconstruction technique. CONTRAST:  75mL OMNIPAQUE IOHEXOL 300 MG/ML  SOLN COMPARISON:  CT Abdomen and Pelvis 09/22/2021 and earlier. FINDINGS: Lower chest: Increased elevation of the right hemidiaphragm with posterolateral basal segment lung opacity indeterminate for atelectasis versus infection. 5 mm right lower lobe lung nodule on series 6,  image 7 was subtle last month and present on a 2019 cardiac CT, stable and benign. No pericardial or pleural effusion. Mild left lung base atelectasis. Hepatobiliary: Stable multiple circumscribed hepatic cysts. Surgically absent gallbladder. Small linear area of inferior right lobe hypoenhancement appears unchanged since since last year and benign. Pancreas: Negative. Chronic enlargement of the pancreatic duct in the head and uncinate appears stable since last year. Spleen: Small volume of perisplenic fluid in cleat, including caudal to the  spleen, with complex fluid density consistent with hemoperitoneum. At the same time, there is no discrete splenic laceration. No convincing splenic contusion. Adrenals/Urinary Tract: Adrenal glands and kidneys appear stable and within normal limits. Unremarkable bladder. Chronic pelvic phleboliths. No urinary calculus identified. Symmetric renal contrast excretion. Stomach/Bowel: Decompressed rectum and sigmoid colon now with a more normal appearance compared to 09/22/2021. Redundant sigmoid tracking into the left upper quadrant. Decompressed descending colon and splenic flexure. Redundant transverse colon with retained stool. Similar retained stool in the right colon. No acute large bowel wall thickening identified. Normal appendix suspected on coronal image 41. Negative terminal ileum. No dilated small bowel. Decompressed stomach. Duodenum is decompressed and does appear to cross midline although proximal small bowel loops appear to be in the right upper quadrant. No free air. No free fluid identified in the mesentery. Vascular/Lymphatic: Extensive Aortoiliac calcified atherosclerosis. Mildly tortuous abdominal aorta. Major arterial structures remain patent. Portal venous system is patent. No lymphadenopathy identified. Reproductive: Stable and within normal limits. Other: Complex fluid, hemoperitoneum in the pelvis, small to moderate volume (series 4, image 61) Musculoskeletal: Advanced lumbar disc and endplate degeneration redemonstrated. No acute or suspicious osseous abnormality identified. IMPRESSION: 1. Positive for Hemoperitoneum - about the spleen and layering in the pelvis, small to moderate volume. No free air or discrete bowel injury identified. Acute splenic injury felt to be the most likely source of bleeding in this setting, despite no obvious splenic laceration. 2. Redundant large bowel with retained stool. Regression of rectosigmoid colon wall thickening since last month. 3. Increased elevation of  the right hemidiaphragm with right lung base opacity indeterminate for atelectasis versus infection. Right lower lobe lung nodule stable since 2019 and benign (no follow-up indicated). 4. Aortic Atherosclerosis (ICD10-I70.0). Electronically Signed: By: Genevie Ann M.D. On: 09/27/2021 09:15    Anti-infectives: Anti-infectives (From admission, onward)    None        Assessment/Plan Hemoperitoneum and bright red blood per rectum s/p colonoscopy 09/24/2021  - afebrile, vitals stable - hgb 8.3 from 9.2, continue to monitor - continue to hold anticoagulation - ok for liquid diet, would not advance past full liquids at this time given her level of pain, hgb has not stabilized, and slight ileus picture.  - no emergent surgical needs at this time   FEN - CLD, adv to fulls as tolerated VTE -SCDs, chemical VTE held due to BRBPR and hemoperitoneum Foley - none      --Below per internal medicine- Atrial fibrillation-on Xarelto, last dose 2/4, hold anticoagulation and give reversal agent Gastritis-noted on upper endoscopy also performed 09/24/2021, would continue Protonix twice daily, avoid NSAIDs Hypertension GERD Chronic constipation Asthma    LOS: 1 day   I reviewed Consultant GI notes, last 24 h vitals and pain scores, last 48 h intake and output, last 24 h labs and trends, and last 24 h imaging results.  This care required moderate level of medical decision making.   Obie Dredge, PA-C [facility] Please see Amion for pager number  during day hours 7:00am-4:30pm

## 2021-09-28 NOTE — Progress Notes (Addendum)
Progress Note Hospital Day: 2  Chief Complaint:   rectal bleeding      ASSESSMENT AND PLAN   Brief History:  71 yo female with history of Afib on Xarelto, HLD, HTN, asthma. Had inpatient EGD / colonoscopy on 09/24/21 for diarrhea, vomiting , abdominal pain, slight drop in her hemoglobin as well as rectal thickening seen on CT imaging.  Endoscopic findings included gastritis, two small colon polyps, left colon erythema, and hemorrhoids. Readmitted 09/27/21 with rectal bleeding    # Rectal bleeding, likely secondary to hemorrhoids in setting of constipation and xarelto.  --Still having some minor rectal bleeding. Start Anusol cream  # Constipation. No BM since colonoscopy  on 09/24/21. Retained stool in colon on CTAP yesterday.  --She just dose stools softener and Miralax. Continue bowel regimen  # Hemoperitoneum / splenic injury anemia. On imaging there are no visible laceration or active extravasation from the spleen, suggesting that the spleen has likely healed --Hgb fluctuating 8.7 >> 9.2 >> 8.3 --Okay from GI standpoint to advance diet ( if cleared by General Surgery)  # Colon polyps, path pending  # Gastritis, path pending   SUBJECTIVE    Would like diet advanced, tolerating clear liquids. Still having minor rectal bleeding but no BM since colonoscopy. Has generalized left sided abdominal pain, especially when walking / moving.    OBJECTIVE   Colonoscopy 09/24/2021 The examined portion of the ileum was normal. - Two 3 to 4 mm polyps in the descending colon and in the ascending colon, removed with a cold snare. Resected and retrieved. - Erythematous mucosa in the rectum, in the sigmoid colon, in the descending colon and in the transverse colon. Biopsied. - Non-bleeding internal hemorrhoids.  EGD 09/24/2021 --gastritis   Scheduled inpatient medications:   docusate sodium  100 mg Oral Daily   polyethylene glycol  17 g Oral Daily   umeclidinium bromide  1 puff Inhalation  Daily   Continuous inpatient infusions:   lactated ringers 75 mL/hr at 09/28/21 0825   PRN inpatient medications: acetaminophen **OR** acetaminophen, HYDROmorphone (DILAUDID) injection, levalbuterol, ondansetron (ZOFRAN) IV, simethicone  Vital signs in last 24 hours: Temp:  [98 F (36.7 C)-98.4 F (36.9 C)] 98 F (36.7 C) (02/06 0814) Pulse Rate:  [70-80] 78 (02/06 0816) Resp:  [15-24] 18 (02/06 0816) BP: (114-150)/(63-79) 134/71 (02/06 0814) SpO2:  [93 %-100 %] 94 % (02/06 0816) Last BM Date: 09/24/21  Intake/Output Summary (Last 24 hours) at 09/28/2021 1050 Last data filed at 09/28/2021 0257 Gross per 24 hour  Intake 818.89 ml  Output --  Net 818.89 ml     Physical Exam:  General: Alert female in NAD Heart:  Regular rate and rhythm. No lower extremity edema Pulmonary: Normal respiratory effort Abdomen: Soft, nondistended, moderate LUQ tenderness. A few bowel sounds.   Neurologic: Alert and oriented Psych: Pleasant. Cooperative.   Filed Weights   09/27/21 0713  Weight: 59.9 kg    ntake/Output from previous day: 02/05 0701 - 02/06 0700 In: 818.9 [P.O.:480; I.V.:92.4; IV Piggyback:246.5] Out: -  Intake/Output this shift: No intake/output data recorded.   DIAGNOSTIC STUDIES THIS ADMISSION:  CTAP w/ contrast 09/27/21 IMPRESSION: 1. Positive for Hemoperitoneum - about the spleen and layering in the pelvis, small to moderate volume. No free air or discrete bowel injury identified. Acute splenic injury felt to be the most likely source of bleeding in this setting, despite no obvious splenic laceration.   2. Redundant large bowel with retained stool. Regression of rectosigmoid  colon wall thickening since last month.   3. Increased elevation of the right hemidiaphragm with right lung base opacity indeterminate for atelectasis versus infection. Right lower lobe lung nodule stable since 2019 and benign (no follow-upindicated).   4. Aortic Atherosclerosis  (ICD10-I70.0).   Lab Results: Recent Labs    09/27/21 0723 09/27/21 0751 09/28/21 0320  WBC 6.5  --  6.2  HGB 8.7* 9.2* 8.3*  HCT 26.6* 27.0* 25.5*  PLT 210  --  212   BMET Recent Labs    09/27/21 0751 09/27/21 0801 09/28/21 0320  NA 142 141 135  K 2.8* 2.8* 3.0*  CL 106 108 100  CO2  --  25 24  GLUCOSE 99 98 86  BUN 4* 5* <5*  CREATININE 0.60 0.78 0.68  CALCIUM  --  9.0 8.8*   LFT Recent Labs    09/27/21 0801  PROT 6.4*  ALBUMIN 3.3*  AST 17  ALT 15  ALKPHOS 58  BILITOT 0.1*   PT/INR No results for input(s): LABPROT, INR in the last 72 hours. Hepatitis Panel No results for input(s): HEPBSAG, HCVAB, HEPAIGM, HEPBIGM in the last 72 hours.    Principal Problem:   Hemoperitoneum     LOS: 1 day   Tye Savoy ,NP 09/28/2021, 10:50 AM    I have taken a history, reviewed the chart and examined the patient. I performed a substantive portion of this encounter, including complete performance of at least one of the key components, in conjunction with the APP. I agree with the APP's note, impression and recommendations  71 year old female s/p EGD/Colonoscopy 09/24/21, readmitted with abdominal pain and hematochezia, with CT scan showing evidence of splenic injury.  Her abdominal pain is improving and her hgb is overall stable.  I think there is little concern for splenic hemorrhage currently and it does not appear she will need surgery.   Her hematochezia is most likely from hemorrhoids rather than a polypectomy bleed.  Recommend continuing to advance diet as long as surgery agrees, continue aggressive bowel regimen as she reports not having a bowel movement in 5 days.  Moussa Wiegand E. Candis Schatz, MD [facility]

## 2021-09-28 NOTE — Telephone Encounter (Signed)
Received call from [place], [place] with [facility]. Requesting VO for HH PT 1 week 1 and 2 week 4 to work on strengthening, gait training, and balance training. Verbal auth given. Will route to PCP for agreement/denial.  Roselie Awkward also needs to make PCP aware of two different Level 2 drug interactions between diltiazem and xarelto and potassium Cl and spironolactone.  Patient is currently admitted to [facility] for rectal bleeding.

## 2021-09-28 NOTE — Progress Notes (Addendum)
Subjective: I seen and evaluated [name] at bedside.  Her sister and niece were present at bedside.  Ms. Pore states left-sided abdominal pain is much improved since admission.  Endorse that the pain regimen helps relieve the pain significantly.  She states she still sees some rectal bleeding from her hemorrhoids, but less blood from previous day.  She states she has not had a bowel movement today but still feels the urge to go.  She denies chest pain, palpitations or shortness of breath.  She states that she is able to tolerate liquids and we will try to advance her diet as tolerated.  Overall, patient states she is feeling much better.  Objective:  Vital signs in last 24 hours: Vitals:   09/28/21 0500 09/28/21 0814 09/28/21 0816 09/28/21 1232  BP:  134/71    Pulse:  80 78 88  Resp: 17 17 18 17   Temp:  98 F (36.7 C)  97.8 F (36.6 C)  TempSrc:  Oral  Oral  SpO2:  93% 94% 91%  Weight:      Height:       Physical Exam Constitutional:      General: She is awake.  HENT:     Head: Normocephalic and atraumatic.  Cardiovascular:     Rate and Rhythm: Normal rate and regular rhythm.     Heart sounds: Normal heart sounds.  Pulmonary:     Effort: Pulmonary effort is normal.     Breath sounds: Normal breath sounds and air entry.  Abdominal:     Palpations: Abdomen is soft.  Musculoskeletal:     Right lower leg: No edema.     Left lower leg: No edema.  Skin:    General: Skin is warm and dry.  Neurological:     General: No focal deficit present.     Mental Status: She is alert.  Psychiatric:        Behavior: Behavior normal. Behavior is cooperative.     Assessment/Plan:  Principal Problem:   Hemoperitoneum  Hemoperitoneum 2/2 colonoscopy Rectal bleeding likely 2/2 hemorrhoids Patient endorsed improvement in her left-sided abdominal pain since admission.  Endorsing moderate relief from pain medication.  Hemoglobin continues to trend down currently at 8.3 down from 8.7.   Patient is still hemodynamically stable, no tachycardia or hypotension.  No indication for surgery at this time.  Patient states she is tolerating liquids and plan is to advance diet as tolerated.  Patient has not had a bowel movement since yesterday, will be adding on a bowel regimen. --Trend CBC --IV Protonix 40 mg twice daily; 8 weeks duration; could potentially switch to oral once patient starts tolerating p.o. --IV Zofran 4 mg every 8 as needed --IV Dilaudid 1 mg every 4 as needed --Senokot S 1 tablet twice daily and MiraLAX daily  Atrial fibrillation Xarelto reversal yesterday. Xarelto has been held in the setting of acute blood loss.  Rate controlled on home medication diltiazem 120 mg.  Patient denies chest pain, palpitations or shortness of breath.  Heart rate remains normal. --Continue hold Xarelto; plan to resume after 2 to 3 days --Resume diltiazem 120 mg twice daily  Hypokalemia Patient was scheduled to receive 4 rounds of potassium chloride 10 mEq IV; patient received 2 of 4 doses and IV potassium was held due to discomfort of the patient.  Potassium levels are still low currently at 3, though slightly improved from yesterday which was 2.8.  We will switch to oral potassium tablets for repletion.  Patient started on home medication spironolactone, which should also help retain potassium. --2 doses of oral potassium 40 mEq --Repeat BMP tomorrow  Asthma Patient continues to be saturating well on room air.  Patient denies shortness of breath.  Lungs clear on auscultation. --Levalbuterol every 6 hours as needed --Incruse Ellipta 1 puff daily  GERD Recent endoscopy on 09/25/2021 revealed normal esophagus; gastritis.  Per GI recommends PPI twice daily. --IV Protonix 40 mg twice daily; switch to oral when patient begins tolerating p.o.  Prior to Admission Living Arrangement: Anticipated Discharge Location: Barriers to Discharge: Dispo: Anticipated discharge in approximately 1-2  day(s).   Timothy Lasso, MD 09/28/2021, 12:37 PM Pager: [phone] After 5pm on weekdays and 1pm on weekends: On Call pager [phone]

## 2021-09-29 LAB — CBC
HCT: 27.7 % — ABNORMAL LOW (ref 36.0–46.0)
Hemoglobin: 8.8 g/dL — ABNORMAL LOW (ref 12.0–15.0)
MCH: 26.1 pg (ref 26.0–34.0)
MCHC: 31.8 g/dL (ref 30.0–36.0)
MCV: 82.2 fL (ref 80.0–100.0)
Platelets: 191 10*3/uL (ref 150–400)
RBC: 3.37 MIL/uL — ABNORMAL LOW (ref 3.87–5.11)
RDW: 17 % — ABNORMAL HIGH (ref 11.5–15.5)
WBC: 6.1 10*3/uL (ref 4.0–10.5)
nRBC: 0.3 % — ABNORMAL HIGH (ref 0.0–0.2)

## 2021-09-29 LAB — BASIC METABOLIC PANEL
Anion gap: 6 (ref 5–15)
BUN: <5 mg/dL — ABNORMAL LOW (ref 8–23)
CO2: 27 mmol/L (ref 22–32)
Calcium: 8.9 mg/dL (ref 8.9–10.3)
Chloride: 103 mmol/L (ref 98–111)
Creatinine, Ser: 0.68 mg/dL (ref 0.44–1.00)
GFR, Estimated: >60 mL/min (ref >60)
Glucose, Bld: 92 mg/dL (ref 70–99)
Potassium: 4 mmol/L (ref 3.5–5.1)
Sodium: 136 mmol/L (ref 135–145)

## 2021-09-29 MED ORDER — RIVAROXABAN 20 MG PO TABS: 20.0000 mg | ORAL_TABLET | Freq: Every day | ORAL | 11 refills | Status: DC

## 2021-09-29 MED ORDER — OMEPRAZOLE 40 MG PO CPDR: 40.0000 mg | DELAYED_RELEASE_CAPSULE | Freq: Two times a day (BID) | ORAL | 1 refills | Status: DC

## 2021-09-29 MED ORDER — HYDROMORPHONE HCL 2 MG PO TABS
2.0000 mg | ORAL_TABLET | Freq: Two times a day (BID) | ORAL | 0 refills | Status: DC | PRN
Start: 2021-09-29 — End: 2021-10-01

## 2021-09-29 MED ORDER — SENNOSIDES-DOCUSATE SODIUM 8.6-50 MG PO TABS: 1.0000 | ORAL_TABLET | Freq: Two times a day (BID) | ORAL | 0 refills | Status: DC

## 2021-09-29 MED ORDER — POLYETHYLENE GLYCOL 3350 17 G PO PACK: 17.0000 g | PACK | Freq: Two times a day (BID) | ORAL | 0 refills | Status: DC

## 2021-09-29 MED ORDER — POLYETHYLENE GLYCOL 3350 17 G PO PACK
17.0000 g | PACK | Freq: Two times a day (BID) | ORAL | Status: DC
  Administered 2021-09-29: 17 g via ORAL

## 2021-09-29 MED ORDER — ONDANSETRON HCL 4 MG/2ML IJ SOLN
4.0000 mg | Freq: Four times a day (QID) | INTRAMUSCULAR | Status: DC | PRN
  Administered 2021-09-29: 4 mg via INTRAVENOUS
  Filled 2021-09-29: qty 2

## 2021-09-29 MED ORDER — BISACODYL 5 MG PO TBEC
10.0000 mg | DELAYED_RELEASE_TABLET | Freq: Once | ORAL | Status: AC
  Administered 2021-09-29: 10 mg via ORAL
  Filled 2021-09-29: qty 2

## 2021-09-29 MED ORDER — TRAMADOL HCL 50 MG PO TABS: 50.0000 mg | ORAL_TABLET | Freq: Four times a day (QID) | ORAL | Status: DC | PRN

## 2021-09-29 MED ORDER — BISACODYL 10 MG RE SUPP
10.0000 mg | Freq: Once | RECTAL | Status: AC
  Administered 2021-09-29: 10 mg via RECTAL
  Filled 2021-09-29: qty 1

## 2021-09-29 NOTE — Discharge Instructions (Addendum)
Please continue taking Xarelto everyday. I have sent oral dilaudid to your pharmacy for pain control; if you have tramadol at home, please do not take it. Continue taking all your other medications as directed. Please follow up with your PCP Dr. Jimmye Norman on Thursday.

## 2021-09-29 NOTE — Progress Notes (Signed)
Discharge paperwork reviewed with pt. Pt verbalized understanding. Pt alert and oriented x4 in no acute distress upon discharge. Pt has gathered all of her belongings. Pt's daughter will transport pt home via private vehicle.

## 2021-09-29 NOTE — Progress Notes (Signed)
[facility] Progress Note     Subjective: CC:  Pain improving, having some left mid back pain. Mobilizing to bedside commode and bathroom. Still no BM since admission. States the IV pain meds help her the most and mildly concerned about pain meds at home.   Objective: Vital signs in last 24 hours: Temp:  [97.8 F (36.6 C)-98.3 F (36.8 C)] 98.2 F (36.8 C) (02/07 0743) Pulse Rate:  [78-88] 84 (02/07 0743) Resp:  [16-18] 16 (02/07 0743) BP: (105-148)/(66-86) 133/75 (02/07 0743) SpO2:  [91 %-95 %] 95 % (02/07 0743) Last BM Date: 09/24/21  Intake/Output from previous day: 02/06 0701 - 02/07 0700 In: 340 [I.V.:340] Out: 400 [Urine:400] Intake/Output this shift: No intake/output data recorded.  PE: Gen:  Alert, NAD, pleasant Card:  Regular rate and rhythm, pedal pulses 2+ BL Pulm:  Normal effort, clear to auscultation bilaterally Abd: Soft, TTP left hemi-abdomen - improved compared to yesterday, no peritonitis  Skin: warm and dry, no rashes  Psych: A&Ox3   Lab Results:  Recent Labs    09/28/21 0320 09/29/21 0127  WBC 6.2 6.1  HGB 8.3* 8.8*  HCT 25.5* 27.7*  PLT 212 191   BMET Recent Labs    09/28/21 0320 09/29/21 0127  NA 135 136  K 3.0* 4.0  CL 100 103  CO2 24 27  GLUCOSE 86 92  BUN <5* <5*  CREATININE 0.68 0.68  CALCIUM 8.8* 8.9   PT/INR No results for input(s): LABPROT, INR in the last 72 hours. CMP     Component Value Date/Time   NA 136 09/29/2021 0127   NA 141 10/30/2020 1008   K 4.0 09/29/2021 0127   CL 103 09/29/2021 0127   CO2 27 09/29/2021 0127   GLUCOSE 92 09/29/2021 0127   BUN <5 (L) 09/29/2021 0127   BUN 7 (L) 10/30/2020 1008   CREATININE 0.68 09/29/2021 0127   CALCIUM 8.9 09/29/2021 0127   PROT 6.4 (L) 09/27/2021 0801   ALBUMIN 3.3 (L) 09/27/2021 0801   AST 17 09/27/2021 0801   ALT 15 09/27/2021 0801   ALKPHOS 58 09/27/2021 0801   BILITOT 0.1 (L) 09/27/2021 0801   GFRNONAA >60 09/29/2021 0127   GFRAA 104 07/24/2020  1121   Lipase     Component Value Date/Time   LIPASE 34 09/27/2021 0801       Studies/Results: No results found.  Anti-infectives: Anti-infectives (From admission, onward)    None        Assessment/Plan Hemoperitoneum and bright red blood per rectum s/p colonoscopy 09/24/2021  - afebrile, vitals stable - hgb 8.8 from 8.3, stable -  Would recommend holding xarelto for a total of 72 hours after reversal and resume xarelto tomorrow 2/8 if H&H remain stable - on annusol per GI for presumed bleeding from hemorrhoids; on colace and miralax for constipation. - tolerating diet  - no emergent surgical needs at this time  - Try PO tramadol for pain control. Mild allergy in chart. She tolerates IV HM.  FEN - Reg VTE -SCDs, chemical VTE held due to BRBPR and hemoperitoneum Foley - none      --Below per internal medicine- Atrial fibrillation-on Xarelto, last dose 2/4, hold anticoagulation and give reversal agent Gastritis-noted on upper endoscopy also performed 09/24/2021, would continue Protonix twice daily, avoid NSAIDs Hypertension GERD Chronic constipation Asthma    LOS: 2 days   I reviewed Consultant GI notes, last 24 h vitals and pain scores, last 48 h intake and output, last 24  h labs and trends, and last 24 h imaging results.  This care required moderate level of medical decision making.   Obie Dredge, PA-C [facility] Please see Amion for pager number during day hours 7:00am-4:30pm

## 2021-09-29 NOTE — Progress Notes (Addendum)
Progress Note Hospital Day: 3  Chief Complaint:  rectal bleeding, anemia     ASSESSMENT AND PLAN   Brief History:  71 yo female with history of Afib on Xarelto, HLD, HTN, asthma. Had inpatient EGD / colonoscopy on 09/24/21 for diarrhea, vomiting , abdominal pain, slight drop in her hemoglobin as well as rectal thickening seen on CT imaging.  Endoscopic findings included gastritis, two small colon polyps, left colon erythema, and hemorrhoids. Readmitted 09/27/21 with rectal bleeding    # Rectal bleeding, likely secondary to hemorrhoids in setting of constipation and xarelto use.  --Anusol cream started yesterday. Bleeding has improved. Only having a tiny amount of rectal bleeding now. Continue Anusol cream PR BID x 7 days.  --Need to treat constipation   # Constipation.  --No BM since colonoscopy on 09/24/21 despite daily Colace, daily Miralax and BID Senna . Will give 10 mg dose of Dulcolax now plus Dulcolax suppository and increase Miralax to BID.   # Hemoperitoneum / splenic injury / anemia. On imaging there are no visible laceration or active extravasation from the spleen, suggesting that the spleen has likely healed --Hgb improving 8.3 >> 8.8.  --On regular diet.    # Colon polyps, path pending   # Gastritis, path pending     SUBJECTIVE   New left low back pain during the night but left sided abdominal pain is improving. Mainly has abdominal pain when up moving around. She still hasn't had a BM since colonoscopy. The rectal bleeding is improving. Using Anusol. She wants to get up and bathe       OBJECTIVE      Scheduled inpatient medications:   diltiazem  120 mg Oral BID   ezetimibe  10 mg Oral Daily   hydrocortisone   Rectal BID   losartan  100 mg Oral Daily   montelukast  10 mg Oral QHS   pantoprazole (PROTONIX) IV  40 mg Intravenous Q12H   polyethylene glycol  17 g Oral Daily   senna-docusate  1 tablet Oral BID   spironolactone  25 mg Oral Daily   umeclidinium  bromide  1 puff Inhalation Daily   Continuous inpatient infusions:  PRN inpatient medications: acetaminophen **OR** acetaminophen, HYDROmorphone (DILAUDID) injection, levalbuterol, ondansetron (ZOFRAN) IV, simethicone  Vital signs in last 24 hours: Temp:  [97.8 F (36.6 C)-98.3 F (36.8 C)] 98.2 F (36.8 C) (02/07 0743) Pulse Rate:  [78-88] 84 (02/07 0743) Resp:  [16-18] 16 (02/07 0743) BP: (105-148)/(66-86) 133/75 (02/07 0743) SpO2:  [91 %-95 %] 95 % (02/07 0743) Last BM Date: 09/24/21  Intake/Output Summary (Last 24 hours) at 09/29/2021 0355 Last data filed at 09/28/2021 2200 Gross per 24 hour  Intake 340 ml  Output 400 ml  Net -60 ml     Physical Exam:  General: Alert female in NAD Heart:  Regular rate and rhythm. No lower extremity edema Pulmonary: Normal respiratory effort Abdomen: Soft, nondistended, moderate LUQ tenderness. Normal bowel sounds.  Neurologic: Alert and oriented Psych: Pleasant. Cooperative.   Filed Weights   09/27/21 0713  Weight: 59.9 kg    ntake/Output from previous day: 02/06 0701 - 02/07 0700 In: 340 [I.V.:340] Out: 400 [Urine:400] Intake/Output this shift: No intake/output data recorded.   DIAGNOSTIC STUDIES THIS ADMISSION:  No results found.     Lab Results: Recent Labs    09/27/21 0723 09/27/21 0751 09/28/21 0320 09/29/21 0127  WBC 6.5  --  6.2 6.1  HGB 8.7* 9.2* 8.3* 8.8*  HCT  26.6* 27.0* 25.5* 27.7*  PLT 210  --  212 191   BMET Recent Labs    09/27/21 0801 09/28/21 0320 09/29/21 0127  NA 141 135 136  K 2.8* 3.0* 4.0  CL 108 100 103  CO2 25 24 27   GLUCOSE 98 86 92  BUN 5* <5* <5*  CREATININE 0.78 0.68 0.68  CALCIUM 9.0 8.8* 8.9   LFT Recent Labs    09/27/21 0801  PROT 6.4*  ALBUMIN 3.3*  AST 17  ALT 15  ALKPHOS 58  BILITOT 0.1*   PT/INR No results for input(s): LABPROT, INR in the last 72 hours. Hepatitis Panel No results for input(s): HEPBSAG, HCVAB, HEPAIGM, HEPBIGM in the last 72  hours.    Principal Problem:   Hemoperitoneum Active Problems:   Chronic idiopathic constipation   Laceration of spleen     LOS: 2 days   Tye Savoy ,NP 09/29/2021, 9:03 AM   I have taken a history, reviewed the chart and examined the patient. I performed a substantive portion of this encounter, including complete performance of at least one of the key components, in conjunction with the APP. I agree with the APP's note, impression and recommendations  Patient is doing well with improved abdominal pain, tolerating regular diet.  Splenic injury/bleeding resolved without intervention.  Ok to discharge from GI standpoint.  She can follow up as needed with Dr. Havery Moros for ongoing management of constipation and hemorrhoids as needed  Maryem Shuffler E. Candis Schatz, MD [facility]

## 2021-09-29 NOTE — Discharge Summary (Signed)
Name: [name] MRN: [id number] DOB: 1950-12-15 71 y.o. PCP: Angelica Pou, MD  Date of Admission: 09/27/2021  7:06 AM Date of Discharge: 09/29/21 Attending Physician: Lottie Mussel, MD  Discharge Diagnosis: 1. Hemoperitoneum 2. Atrial fibrillation 3. Hypokalemia  Discharge Medications: Allergies as of 09/29/2021       Reactions   Albuterol Palpitations   Caused afib    Percocet [oxycodone-acetaminophen] Shortness Of Breath, Itching, Anxiety   Makes jittery, difficulty breathing, itching   Celecoxib Rash   Tramadol Palpitations   Ok to trial again 09/29/2021 and f/u HR        Medication List     TAKE these medications    alendronate 70 MG tablet Commonly known as: Fosamax Take 1 tablet (70 mg total) by mouth every 7 (seven) days. Take with a full glass of water on an empty stomach.   atorvastatin 40 MG tablet Commonly known as: Lipitor Take 1 tablet (40 mg total) by mouth daily.   cetirizine 10 MG tablet Commonly known as: ZyrTEC Allergy Take 1 tablet (10 mg total) by mouth daily as needed for allergies.   diltiazem 120 MG 24 hr capsule Commonly known as: Cartia XT Take 1 capsule (120 mg total) by mouth 2 (two) times daily.   diltiazem 60 MG tablet Commonly known as: CARDIZEM Take 1 tablet (60 mg total) by mouth every 6 (six) hours as needed (afib).   docusate sodium 100 MG capsule Commonly known as: COLACE Take 1 capsule (100 mg total) by mouth daily. What changed:  when to take this reasons to take this   ezetimibe 10 MG tablet Commonly known as: Zetia Take 1 tablet (10 mg total) by mouth daily.   famotidine 10 MG tablet Commonly known as: PEPCID Take 10 mg by mouth 2 (two) times daily as needed for heartburn or indigestion.   fluticasone 44 MCG/ACT inhaler Commonly known as: FLOVENT HFA Inhale 1 puff into the lungs daily.   HYDROcodone-acetaminophen 5-325 MG tablet Commonly known as: NORCO/VICODIN Take 1/2 tab to 1 tab if needed for  days when you are experiencing severe pain. What changed:  how much to take how to take this when to take this reasons to take this additional instructions   hydrocortisone 1 % ointment Apply topically 3 (three) times daily.   HYDROmorphone 2 MG tablet Commonly known as: Dilaudid Take 1 tablet (2 mg total) by mouth every 12 (twelve) hours as needed for up to 5 days for severe pain.   levalbuterol 45 MCG/ACT inhaler Commonly known as: XOPENEX HFA INHALE 1 PUFF INTO THE LUNGS EVERY 6 HOURS AS NEEDED FOR SHORTNESS OF BREATH What changed:  how much to take how to take this when to take this reasons to take this additional instructions   losartan 50 MG tablet Commonly known as: COZAAR Take 2 tablets (100 mg total) by mouth daily.   montelukast 10 MG tablet Commonly known as: SINGULAIR Take 1 tablet (10 mg total) by mouth at bedtime.   omeprazole 40 MG capsule Commonly known as: PRILOSEC Take 1 capsule (40 mg total) by mouth in the morning and at bedtime.   ondansetron 4 MG disintegrating tablet Commonly known as: ZOFRAN-ODT Take 1 tablet (4 mg total) by mouth every 8 (eight) hours as needed for nausea or vomiting.   polyethylene glycol 17 g packet Commonly known as: MIRALAX / GLYCOLAX Take 17 g by mouth 2 (two) times daily.   potassium chloride 10 MEQ tablet Commonly known as: KLOR-CON  M Take 1 tablet (10 mEq total) by mouth daily.   rivaroxaban 20 MG Tabs tablet Commonly known as: XARELTO Take 1 tablet (20 mg total) by mouth daily with supper.   senna-docusate 8.6-50 MG tablet Commonly known as: Senokot-S Take 1 tablet by mouth 2 (two) times daily.   simethicone 80 MG chewable tablet Commonly known as: MYLICON Chew 1 tablet (80 mg total) by mouth 4 (four) times daily as needed for flatulence.   spironolactone 25 MG tablet Commonly known as: Aldactone Take 1 tablet (25 mg total) by mouth daily.   sucralfate 1 g tablet Commonly known as: Carafate Take 1  tablet (1 g total) by mouth 4 (four) times daily -  with meals and at bedtime.   tiotropium 18 MCG inhalation capsule Commonly known as: SPIRIVA Place 1 capsule (18 mcg total) into inhaler and inhale daily.        Disposition and follow-up:   [name] was discharged from [facility] in Stable condition.  At the hospital follow up visit please address:  1.  Follow up with PCP Thursday 10/01/21; Continue taking Xarelto daily.         2.  Labs / imaging needed at time of follow-up: Recheck CBC- hgb levels  3.  Pending labs/ test needing follow-up: None  Follow-up Appointments:  Follow-up Information     Angelica Pou, MD Follow up on 10/01/2021.   Specialty: Internal Medicine Contact information: [address] [place] [address] [phone]         Thompson Grayer, MD .   Specialty: Cardiology Contact information: [address] [place] [address] [place] by problem list: 1. Patient underwent routine colonoscopy 09/24/2021.  Per colonoscopy report, there were 2 polyps in the descending colon and 2 polyps in the ascending colon that were removed.  Since then patient experienced left-sided abdominal pain that radiates to the epigastric area.CT of abdomen and pelvis reveals hemoperitoneum near the spleen layering into the pelvis with small to moderate volume.  No free air or discrete bowel injury noted.  Stool burden present.  Negative for perforation.  Hemoglobin dropped from 10.7-->>8.7 over the course of 3 days indicating acute blood loss.  Surgery was consulted and is recommending observation at this time.  Patient is hemodynamically stable, no tachycardia or hypotension. Xarelto was held during hospitalization. Safe to resume Xarelto at discharge. 2. On Xarelto, last dose yesterday 09/26/21.  Patient is rate controlled on diltiazem 120 mg twice daily; 60 mg every 6 hours as needed.   Patient denies chest pain, shortness of breath or palpitations.  EKG is reassuring, normal sinus rhythm, no sign of STEMI or arrhythmia.  Patient remained in normal sinus rhythm throughout hospitalization. Diltiazem was resumed during hospitalization. 3.Initial labs obtained in the ED reveals hypokalemia at 2.8.  Patient denies numbness, weakness tremors. No significant T wave changes on EKG noted. Pt received oral and IV K+ for repletion.  Discharge Exam:   BP (!) 132/98 (BP Location: Left Arm)    Pulse 84    Temp 98.2 F (36.8 C) (Oral)    Resp 16    Ht 5\' 7"  (1.702 m)    Wt 59.9 kg    SpO2 95%    BMI 20.67 kg/m  Discharge exam: Physical Exam Constitutional:      General: She is awake.  HENT:  Head: Normocephalic and atraumatic.  Cardiovascular:     Rate and Rhythm: Normal rate and regular rhythm.     Heart sounds: Normal heart sounds.  Pulmonary:     Effort: Pulmonary effort is normal.     Breath sounds: Normal breath sounds and air entry.  Abdominal:     General: Abdomen is flat.     Palpations: Abdomen is soft.     Tenderness: There is no abdominal tenderness.  Musculoskeletal:     Right lower leg: No edema.     Left lower leg: No edema.  Skin:    General: Skin is warm and dry.  Neurological:     General: No focal deficit present.     Mental Status: She is alert.  Psychiatric:        Behavior: Behavior normal. Behavior is cooperative.     Pertinent Labs, Studies, and Procedures:  CBC Latest Ref Rng & Units 09/29/2021 09/28/2021 09/27/2021  WBC 4.0 - 10.5 K/uL 6.1 6.2 -  Hemoglobin 12.0 - 15.0 g/dL 8.8(L) 8.3(L) 9.2(L)  Hematocrit 36.0 - 46.0 % 27.7(L) 25.5(L) 27.0(L)  Platelets 150 - 400 K/uL 191 212 -   BMP Latest Ref Rng & Units 09/29/2021 09/28/2021 09/27/2021  Glucose 70 - 99 mg/dL 92 86 98  BUN 8 - 23 mg/dL <5(L) <5(L) 5(L)  Creatinine 0.44 - 1.00 mg/dL 0.68 0.68 0.78  BUN/Creat Ratio 12 - 28 - - -  Sodium 135 - 145 mmol/L 136 135 141  Potassium 3.5 - 5.1 mmol/L 4.0  3.0(L) 2.8(L)  Chloride 98 - 111 mmol/L 103 100 108  CO2 22 - 32 mmol/L 27 24 25   Calcium 8.9 - 10.3 mg/dL 8.9 8.8(L) 9.0   CT Abdomen Pelvis W Contrast  Addendum Date: 09/27/2021   ADDENDUM REPORT: 09/27/2021 09:40 ADDENDUM: Study discussed by telephone with Dr. Marda Stalker on 09/27/2021 at 0926 hours. Electronically Signed   By: Genevie Ann M.D.   On: 09/27/2021 09:40   Result Date: 09/27/2021 CLINICAL DATA:  71 year old female with abdominal pain. Status post colonoscopy several days ago. EXAM: CT ABDOMEN AND PELVIS WITH CONTRAST TECHNIQUE: Multidetector CT imaging of the abdomen and pelvis was performed using the standard protocol following bolus administration of intravenous contrast. RADIATION DOSE REDUCTION: This exam was performed according to the departmental dose-optimization program which includes automated exposure control, adjustment of the mA and/or kV according to patient size and/or use of iterative reconstruction technique. CONTRAST:  20mL OMNIPAQUE IOHEXOL 300 MG/ML  SOLN COMPARISON:  CT Abdomen and Pelvis 09/22/2021 and earlier. FINDINGS: Lower chest: Increased elevation of the right hemidiaphragm with posterolateral basal segment lung opacity indeterminate for atelectasis versus infection. 5 mm right lower lobe lung nodule on series 6, image 7 was subtle last month and present on a 2019 cardiac CT, stable and benign. No pericardial or pleural effusion. Mild left lung base atelectasis. Hepatobiliary: Stable multiple circumscribed hepatic cysts. Surgically absent gallbladder. Small linear area of inferior right lobe hypoenhancement appears unchanged since since last year and benign. Pancreas: Negative. Chronic enlargement of the pancreatic duct in the head and uncinate appears stable since last year. Spleen: Small volume of perisplenic fluid in cleat, including caudal to the spleen, with complex fluid density consistent with hemoperitoneum. At the same time, there is no discrete splenic  laceration. No convincing splenic contusion. Adrenals/Urinary Tract: Adrenal glands and kidneys appear stable and within normal limits. Unremarkable bladder. Chronic pelvic phleboliths. No urinary calculus identified. Symmetric renal contrast excretion. Stomach/Bowel: Decompressed rectum and sigmoid  colon now with a more normal appearance compared to 09/22/2021. Redundant sigmoid tracking into the left upper quadrant. Decompressed descending colon and splenic flexure. Redundant transverse colon with retained stool. Similar retained stool in the right colon. No acute large bowel wall thickening identified. Normal appendix suspected on coronal image 41. Negative terminal ileum. No dilated small bowel. Decompressed stomach. Duodenum is decompressed and does appear to cross midline although proximal small bowel loops appear to be in the right upper quadrant. No free air. No free fluid identified in the mesentery. Vascular/Lymphatic: Extensive Aortoiliac calcified atherosclerosis. Mildly tortuous abdominal aorta. Major arterial structures remain patent. Portal venous system is patent. No lymphadenopathy identified. Reproductive: Stable and within normal limits. Other: Complex fluid, hemoperitoneum in the pelvis, small to moderate volume (series 4, image 61) Musculoskeletal: Advanced lumbar disc and endplate degeneration redemonstrated. No acute or suspicious osseous abnormality identified. IMPRESSION: 1. Positive for Hemoperitoneum - about the spleen and layering in the pelvis, small to moderate volume. No free air or discrete bowel injury identified. Acute splenic injury felt to be the most likely source of bleeding in this setting, despite no obvious splenic laceration. 2. Redundant large bowel with retained stool. Regression of rectosigmoid colon wall thickening since last month. 3. Increased elevation of the right hemidiaphragm with right lung base opacity indeterminate for atelectasis versus infection. Right lower  lobe lung nodule stable since 2019 and benign (no follow-up indicated). 4. Aortic Atherosclerosis (ICD10-I70.0). Electronically Signed: By: Genevie Ann M.D. On: 09/27/2021 09:15   CT ABDOMEN PELVIS W CONTRAST  Result Date: 09/22/2021 CLINICAL DATA:  Severe abdominal pain, nausea and vomiting, diarrhea EXAM: CT ABDOMEN AND PELVIS WITH CONTRAST TECHNIQUE: Multidetector CT imaging of the abdomen and pelvis was performed using the standard protocol following bolus administration of intravenous contrast. RADIATION DOSE REDUCTION: This exam was performed according to the departmental dose-optimization program which includes automated exposure control, adjustment of the mA and/or kV according to patient size and/or use of iterative reconstruction technique. CONTRAST:  55mL OMNIPAQUE IOHEXOL 300 MG/ML  SOLN COMPARISON:  08/29/2021 FINDINGS: Lower chest: No acute pleural or parenchymal lung disease. Hepatobiliary: Stable hepatic cysts. Stable transient hepatic attenuation difference inferior right lobe liver, normalizing on delayed images and stable since prior study. No other focal liver abnormalities. No intrahepatic duct dilation. Gallbladder is surgically absent. Pancreas: Unremarkable. No pancreatic ductal dilatation or surrounding inflammatory changes. Spleen: Normal in size without focal abnormality. Adrenals/Urinary Tract: Adrenal glands are unremarkable. Kidneys are normal, without renal calculi, focal lesion, or hydronephrosis. Bladder is decompressed, limiting its evaluation. Stomach/Bowel: No bowel obstruction or ileus. Normal appendix right lower quadrant. There is moderate rectal wall thickening which may reflect proctitis. Mild wall thickening elsewhere throughout the transverse and descending colon likely due to decompressed state. Vascular/Lymphatic: Aortic atherosclerosis. No enlarged abdominal or pelvic lymph nodes. Reproductive: Uterus and bilateral adnexa are unremarkable. Other: No free fluid or free  intraperitoneal gas. Mild perirectal fat stranding. No abdominal wall hernia. Musculoskeletal: No acute or destructive bony lesions. Reconstructed images demonstrate no additional findings. IMPRESSION: 1. Rectal wall thickening and mild perirectal fat stranding, consistent with proctitis. 2. Otherwise no acute intra-abdominal or intrapelvic process. 3.  Aortic Atherosclerosis (ICD10-I70.0). Electronically Signed   By: Randa Ngo M.D.   On: 09/22/2021 15:45      Discharge Instructions: Discharge Instructions     Call MD for:  difficulty breathing, headache or visual disturbances   Complete by: As directed    Call MD for:  extreme fatigue   Complete  by: As directed    Call MD for:  hives   Complete by: As directed    Call MD for:  persistant dizziness or light-headedness   Complete by: As directed    Call MD for:  persistant nausea and vomiting   Complete by: As directed    Call MD for:  redness, tenderness, or signs of infection (pain, swelling, redness, odor or green/yellow discharge around incision site)   Complete by: As directed    Call MD for:  severe uncontrolled pain   Complete by: As directed    Call MD for:  temperature >100.4   Complete by: As directed    Diet - low sodium heart healthy   Complete by: As directed    Increase activity slowly   Complete by: As directed        Signed: Timothy Lasso, MD 09/29/2021, 11:22 AM   Pager: [phone]

## 2021-09-29 NOTE — Progress Notes (Signed)
Pt c/o nausea, unrelieved with zofran. Pt has asked for more nausea medication. Teaching services have been paged and made aware. Awaiting for new orders.

## 2021-09-30 LAB — SURGICAL PATHOLOGY

## 2021-10-01 ENCOUNTER — Other Ambulatory Visit: Payer: Self-pay

## 2021-10-01 ENCOUNTER — Encounter: Payer: Self-pay | Admitting: Internal Medicine

## 2021-10-01 ENCOUNTER — Ambulatory Visit (INDEPENDENT_AMBULATORY_CARE_PROVIDER_SITE_OTHER): Payer: Medicare Other | Admitting: Internal Medicine

## 2021-10-01 VITALS — BP 112/61 | HR 80 | Temp 98.3°F | Ht 67.0 in | Wt 131.7 lb

## 2021-10-01 DIAGNOSIS — S36039D Unspecified laceration of spleen, subsequent encounter: Secondary | ICD-10-CM

## 2021-10-01 DIAGNOSIS — R197 Diarrhea, unspecified: Secondary | ICD-10-CM

## 2021-10-01 DIAGNOSIS — Z86010 Personal history of colonic polyps: Secondary | ICD-10-CM | POA: Diagnosis not present

## 2021-10-01 DIAGNOSIS — E876 Hypokalemia: Secondary | ICD-10-CM | POA: Diagnosis not present

## 2021-10-01 DIAGNOSIS — A048 Other specified bacterial intestinal infections: Secondary | ICD-10-CM

## 2021-10-01 DIAGNOSIS — Z8639 Personal history of other endocrine, nutritional and metabolic disease: Secondary | ICD-10-CM

## 2021-10-01 DIAGNOSIS — D6869 Other thrombophilia: Secondary | ICD-10-CM

## 2021-10-01 DIAGNOSIS — I10 Essential (primary) hypertension: Secondary | ICD-10-CM

## 2021-10-01 DIAGNOSIS — I4891 Unspecified atrial fibrillation: Secondary | ICD-10-CM

## 2021-10-01 DIAGNOSIS — Z8679 Personal history of other diseases of the circulatory system: Secondary | ICD-10-CM

## 2021-10-01 DIAGNOSIS — E785 Hyperlipidemia, unspecified: Secondary | ICD-10-CM

## 2021-10-01 DIAGNOSIS — K661 Hemoperitoneum: Secondary | ICD-10-CM

## 2021-10-01 MED ORDER — AMOXICILLIN 500 MG PO TABS: 1000.0000 mg | ORAL_TABLET | Freq: Two times a day (BID) | ORAL | 0 refills | Status: DC

## 2021-10-01 NOTE — Progress Notes (Signed)
[name] is here for hospital follow-up of 2 recent hospitalizations.  Hospitalization 09/21/2021, presenting with diarrhea and minimal hematochezia in the setting of taking MiraLAX and Linzess twice daily, also on long term anticoagulation for AF.  CT showed proctitis, EGD and colonoscopy revealed gastritis without active bleeding.  Hematochezia was felt to be due to hemorrhoids seen on physical exam and was prescribed Anusol HC.  She was advised to continue her Xarelto.  Colonoscopy revealed two 3-4 mm polyps in the descending and ascending colon which were removed with a cold snare, erythematous mucosa in the rectum sigmoid colon and descending colon which were biopsied, and nonbleeding internal hemorrhoids.  Patient was to follow-up outpatient for biopsy results, though was readmitted before this could occur.  Hospitalization 2/5-09/29/21 for severe L sided abdominal pain found to be due to splenic laceration with hemoperitoneum, a presumed complication of recent colonoscopy on 09/24/21. She stabilized and was advised to continue Xarelto. Hg 9.2 on 09/27/21, 8.8 on 09/29/21.  Since that time, she has continued to be quite uncomfortable, though prefers not to use the hydromorphone she was prescribed at discharge after reading potential adverse effects on the medication instructions-favoring instead to take the hydrocodone she is prescribed for chronic pain.  During the most recent admission, her EGD pathology identified H. pylori-discussion of further treatment was appropriately postponed until I could meet with her today.  She also has a follow-up appointment with the gastroenterologist later in the month.  Medication changes have been quite confusing for [name].  Her pantoprazole 40 mg twice daily was changed for some reason to omeprazole 40 mg twice daily.  Although instructions indicated she should take senna S, docusate is on her discharge med list.  She continues to be very worried about bowel movements,  particularly because she presented with left-sided abdominal pain and was found to have significant constipation with associated stercoral ulceration.  Fortunately, she has begun to have more normal bowel movements.  She question ongoing sucralfate 4 times daily-she has been on this for some time to control chronic epigastric discomfort.  She reports that the medication makes her feel worse and desires to stop it.  BP 112/61 (BP Location: Right Arm, Patient Position: Sitting, Cuff Size: Small)    Pulse 80    Temp 98.3 F (36.8 C) (Oral)    Ht 5\' 7"  (1.702 m)    Wt 131 lb 11.2 oz (59.7 kg)    SpO2 99%    BMI 20.63 kg/m   She is at her baseline slender habitus, with baseline anxiety.  Abdominal exam notable for tenderness in the left upper quadrant, no distention, normal bowel sounds.  Periphery warm and well-perfused.  She had a long stay in the clinic for various reasons, and eventually her abdominal pain worsened to the point where she was allowed to lie down in a hospital bed.  She took one of her hydrocodone's which she had brought along with her other medications for review today.  Significantly improved after about 45 minutes.  Assessment and plan (see also problem based documentation):  H. pylori infection: This would certainly explain her protracted epigastric symptoms.  After reviewing treatment options and with knowledge that she is easily confused by complex medical regimens and may have some medication interactions which would contraindicate some regimens, I reached out to her gastroenterologist who will discuss with her some alternatives.  She agrees with this.  In the meantime, she will continue the twice daily PPI.  Ongoing constipation, in  part related to chronic opioid use.  Again because of her predisposition to confusion over medications, she will need ongoing support and encouragement.  At this time, I will order the Senokot S and advised her to take 1 twice a day beginning this  evening, stop the Colace (stool softener without stimulant will be ineffective in the setting), and to take MiraLAX daily for now.  This regimen can be adjusted based on her results.  Left splenic laceration: Check CBC today.  Pain should continue to improve with time.  F/u in 4 wks.

## 2021-10-01 NOTE — Patient Outreach (Signed)
[facility]) Care Management  10/01/2021  [name] 10/24/1950 [id number]  High Risk referral: extreme  Assessed for Embed[facility] services at [facility]  Plan: Refer patient to Embedded pharmacy for medication management needs   Natividad Brood, RN BSN [facility] Liaison  [phone] business mobile phone Toll free office [phone]  Fax number: [phone] [name]

## 2021-10-01 NOTE — Patient Instructions (Addendum)
Ms. Victor,  I'm so glad you're finally out of the hospital.  It has certainly been rough.  Today we spent most of our time going through your medications.  We are eliminating some:  Carafate/sucralfate, 4 times a day, for stomach inflammation.   Pantoprazole (for acid, heartburn, reflux, GERD, ulcers) because it was replaced by another similar one called OMEPRAZOLE 40 mg twice a day. Hydromorphone - you are fearful, and already have hydrocodone to use for your pain.   Your hospital tests came back showing that you have a bacteria infection in your stomach which is known to cause stomach pain, belching, nausea, gastritis, and ulcers.  You will need to take a combination of antibiotics for 2 weeks to treat this infection.  I WILL CALL YOU WITH THE PLAN once I determine which ones will be safest for you.  I do believe you'll feel much better after this!  Blood tests today to check your blood counts and your potassium.  I'll let you know the results when I call about your antibiotics.  I hope you feel better soon.  Let's get together in a month to see how you're doing.  Dr. Jimmye Norman

## 2021-10-02 ENCOUNTER — Other Ambulatory Visit: Payer: Self-pay | Admitting: Internal Medicine

## 2021-10-02 ENCOUNTER — Telehealth: Payer: Self-pay

## 2021-10-02 DIAGNOSIS — A048 Other specified bacterial intestinal infections: Secondary | ICD-10-CM

## 2021-10-02 NOTE — Telephone Encounter (Signed)
Questions about taking senna-docusate (SENOKOT-S) 8.6-50 MG tablet, please call pt back.

## 2021-10-03 LAB — CBC
Hematocrit: 28 % — ABNORMAL LOW (ref 34.0–46.6)
Hemoglobin: 9.3 g/dL — ABNORMAL LOW (ref 11.1–15.9)
MCH: 26.7 pg (ref 26.6–33.0)
MCHC: 33.2 g/dL (ref 31.5–35.7)
MCV: 81 fL (ref 79–97)
Platelets: 334 10*3/uL (ref 150–450)
RBC: 3.48 x10E6/uL — ABNORMAL LOW (ref 3.77–5.28)
RDW: 15.4 % (ref 11.7–15.4)
WBC: 5.9 10*3/uL (ref 3.4–10.8)

## 2021-10-03 LAB — BMP8+ANION GAP
Anion Gap: 17 mmol/L (ref 10.0–18.0)
BUN/Creatinine Ratio: 6 — ABNORMAL LOW (ref 12–28)
BUN: 6 mg/dL — ABNORMAL LOW (ref 8–27)
CO2: 21 mmol/L (ref 20–29)
Calcium: 9.6 mg/dL (ref 8.7–10.3)
Chloride: 102 mmol/L (ref 96–106)
Creatinine, Ser: 0.99 mg/dL (ref 0.57–1.00)
Glucose: 92 mg/dL (ref 70–99)
Potassium: 4.6 mmol/L (ref 3.5–5.2)
Sodium: 140 mmol/L (ref 134–144)
eGFR: 61 mL/min/{1.73_m2} (ref >59)

## 2021-10-05 ENCOUNTER — Telehealth: Payer: Self-pay | Admitting: Internal Medicine

## 2021-10-05 NOTE — Progress Notes (Signed)
Patient had acute blood loss anemia secondary to Hemoperitoneum after routine colonoscopy

## 2021-10-05 NOTE — Telephone Encounter (Signed)
Returned call to patient. States she has been taking Miralax and senokot and is "using the bathroom a whole lot." Having 3 formed stools per day which she feels is too many. She is advised she can continue taking Miralax once daily in AM, and use senokot once daily q HS PRN. She likes this plan.

## 2021-10-05 NOTE — Telephone Encounter (Signed)
Pt requesting a call back about the following medications and having a lot of bowels movements.  Pt wants to know if she should be taking both.  polyethylene glycol (MIRALAX / GLYCOLAX) 17 g packet  senna-docusate (SENOKOT-S) 8.6-50 MG tablet

## 2021-10-07 ENCOUNTER — Telehealth: Payer: Self-pay

## 2021-10-07 DIAGNOSIS — S36039D Unspecified laceration of spleen, subsequent encounter: Secondary | ICD-10-CM

## 2021-10-07 MED ORDER — HYDROCODONE-ACETAMINOPHEN 5-325 MG PO TABS: ORAL_TABLET | ORAL | 0 refills | Status: DC

## 2021-10-07 NOTE — Telephone Encounter (Signed)
RTC to patient.States medication is working some. Has not had a bowel movement yesterday or today as of yet.Has been passing gas and stomach feels like something is getting ready to come, Continues to take her pain medication and is drinking 4-5 bottles water per day.  Would like a refill on her pain medication and something for nausea.  Wants to wait to see if she has a bowel movement today before trying any other medicines or making changes. No fevers at this time.  Still has the same pain.

## 2021-10-07 NOTE — Telephone Encounter (Addendum)
RTC to patient given instructions to take the Senokot 2 times daily.  If no relief to add Miralax daily.  Patient agreed to plan .  Would still like refill on pain med and something for Nausea.

## 2021-10-07 NOTE — Telephone Encounter (Incomplete Revision)
RTC to patient given instructions to take the Senokot 2 times daily.  If no relief to add Miralax daily.  Patient agreed to plan .  Would still like refill on pain med and something for Nausea.

## 2021-10-07 NOTE — Telephone Encounter (Signed)
Pt is requesting a call back .. she stated that she was doing what her PCP said to do inorder to  move her bowels but for the past two days it is no longer working and sh is back to having pain in left side again

## 2021-10-08 ENCOUNTER — Telehealth: Payer: Self-pay | Admitting: *Deleted

## 2021-10-08 DIAGNOSIS — R110 Nausea: Secondary | ICD-10-CM

## 2021-10-08 MED ORDER — ONDANSETRON HCL 4 MG PO TABS: 4.0000 mg | ORAL_TABLET | Freq: Every day | ORAL | 1 refills | Status: DC | PRN

## 2021-10-08 NOTE — Addendum Note (Signed)
Addended by: Dorian Pod A on: 10/08/2021 04:37 PM   Modules accepted: Orders

## 2021-10-08 NOTE — Telephone Encounter (Signed)
Pt called / informed to start taking Senokot-S 1 tab twice a day per Dr Jimmye Norman - pt repeated instructions correctly. Then she stated she needs something for nausea.

## 2021-10-08 NOTE — Telephone Encounter (Signed)
Call from pt - stated she took Miralax  and no BM this morning. She also noticed some external hemorrhoids. Stated she has been using Pro[employer]o-[employer] HC 2.5. Stated she's keeping her doctor informed as instructed - I told her I appreciated her calling - she stated she does not mean to bother [name] by calling everyday.

## 2021-10-09 ENCOUNTER — Emergency Department (HOSPITAL_COMMUNITY): Payer: Medicare Other

## 2021-10-09 ENCOUNTER — Emergency Department (HOSPITAL_COMMUNITY)
Admission: EM | Admit: 2021-10-09 | Discharge: 2021-10-09 | Disposition: A | Discharge status: Home / Self Care | Payer: Medicare Other | Attending: Emergency Medicine | Admitting: Emergency Medicine

## 2021-10-09 ENCOUNTER — Encounter (HOSPITAL_COMMUNITY): Payer: Self-pay

## 2021-10-09 ENCOUNTER — Other Ambulatory Visit: Payer: Self-pay

## 2021-10-09 DIAGNOSIS — Z743 Need for continuous supervision: Secondary | ICD-10-CM | POA: Diagnosis not present

## 2021-10-09 DIAGNOSIS — I10 Essential (primary) hypertension: Secondary | ICD-10-CM | POA: Insufficient documentation

## 2021-10-09 DIAGNOSIS — Z7901 Long term (current) use of anticoagulants: Secondary | ICD-10-CM | POA: Diagnosis not present

## 2021-10-09 DIAGNOSIS — R58 Hemorrhage, not elsewhere classified: Secondary | ICD-10-CM | POA: Diagnosis not present

## 2021-10-09 DIAGNOSIS — Z79899 Other long term (current) drug therapy: Secondary | ICD-10-CM | POA: Diagnosis not present

## 2021-10-09 DIAGNOSIS — R109 Unspecified abdominal pain: Secondary | ICD-10-CM | POA: Diagnosis not present

## 2021-10-09 DIAGNOSIS — K5900 Constipation, unspecified: Secondary | ICD-10-CM | POA: Diagnosis not present

## 2021-10-09 DIAGNOSIS — R1012 Left upper quadrant pain: Secondary | ICD-10-CM | POA: Diagnosis not present

## 2021-10-09 LAB — CBC WITH DIFFERENTIAL/PLATELET
Abs Immature Granulocytes: 0.01 10*3/uL (ref 0.00–0.07)
Basophils Absolute: 0 10*3/uL (ref 0.0–0.1)
Basophils Relative: 1 %
Eosinophils Absolute: 0.1 10*3/uL (ref 0.0–0.5)
Eosinophils Relative: 3 %
HCT: 29.6 % — ABNORMAL LOW (ref 36.0–46.0)
Hemoglobin: 9.7 g/dL — ABNORMAL LOW (ref 12.0–15.0)
Immature Granulocytes: 0 %
Lymphocytes Relative: 19 %
Lymphs Abs: 0.7 10*3/uL (ref 0.7–4.0)
MCH: 27 pg (ref 26.0–34.0)
MCHC: 32.8 g/dL (ref 30.0–36.0)
MCV: 82.5 fL (ref 80.0–100.0)
Monocytes Absolute: 0.4 10*3/uL (ref 0.1–1.0)
Monocytes Relative: 11 %
Neutro Abs: 2.6 10*3/uL (ref 1.7–7.7)
Neutrophils Relative %: 66 %
Platelets: 375 10*3/uL (ref 150–400)
RBC: 3.59 MIL/uL — ABNORMAL LOW (ref 3.87–5.11)
RDW: 18.1 % — ABNORMAL HIGH (ref 11.5–15.5)
WBC: 3.9 10*3/uL — ABNORMAL LOW (ref 4.0–10.5)
nRBC: 0 % (ref 0.0–0.2)

## 2021-10-09 LAB — COMPREHENSIVE METABOLIC PANEL
ALT: 36 U/L (ref 0–44)
AST: 27 U/L (ref 15–41)
Albumin: 3.7 g/dL (ref 3.5–5.0)
Alkaline Phosphatase: 63 U/L (ref 38–126)
Anion gap: 9 (ref 5–15)
BUN: 12 mg/dL (ref 8–23)
CO2: 23 mmol/L (ref 22–32)
Calcium: 9.7 mg/dL (ref 8.9–10.3)
Chloride: 104 mmol/L (ref 98–111)
Creatinine, Ser: 0.8 mg/dL (ref 0.44–1.00)
GFR, Estimated: >60 mL/min (ref >60)
Glucose, Bld: 95 mg/dL (ref 70–99)
Potassium: 3.6 mmol/L (ref 3.5–5.1)
Sodium: 136 mmol/L (ref 135–145)
Total Bilirubin: 0.2 mg/dL — ABNORMAL LOW (ref 0.3–1.2)
Total Protein: 6.8 g/dL (ref 6.5–8.1)

## 2021-10-09 LAB — PROTIME-INR
INR: 2.4 — ABNORMAL HIGH (ref 0.8–1.2)
Prothrombin Time: 26.1 seconds — ABNORMAL HIGH (ref 11.4–15.2)

## 2021-10-09 LAB — SAMPLE TO BLOOD BANK

## 2021-10-09 MED ORDER — LACTULOSE 10 GM/15ML PO SOLN: 10.0000 g | Freq: Every day | ORAL | 0 refills | Status: DC | PRN

## 2021-10-09 MED ORDER — SUCRALFATE 1 G PO TABS: 1.0000 g | ORAL_TABLET | Freq: Three times a day (TID) | ORAL | 1 refills | Status: DC

## 2021-10-09 NOTE — ED Provider Notes (Signed)
[facility] EMERGENCY DEPARTMENT Provider Note   CSN: [id number] Arrival date & time: 10/09/21  0540     History  Chief Complaint  Patient presents with   Abdominal Pain    [name] is a 71 y.o. female.  Patient is a 70 year old female with a history of hypertension, atrial fibrillation on Xarelto, chronic constipation and abdominal pain who was recently hospitalized at the beginning of this month for proctitis, abdominal pain and constipation.  She received a colonoscopy and EGD that showed gastritis but no other acute findings.  After colonoscopy and discharge home patient developed sudden left upper quadrant pain and returned with hemoperitoneum thought to be a splenic injury from the recent colonoscopy.  Patient was observed in the hospital, Xarelto was held and symptoms improved.  There were no further bleeding and patient's hemoglobin has continued to improve.  Patient reports she is coming today because she has not had a bowel movement in 5 days.  She continues to have left upper quadrant pain which has been present since they colonoscopy she reports its not really getting any better or worse.  She states that she is trying to eat greens and various other foods but she is not able to have a bowel movement.  She has been taking MiraLAX without improvement and then spoke with her doctor yesterday and they changed her to Senokot.  She reports when she does attempt to have a bowel movement looks like something is coming out of her rectum that is red or pink.  She does occasionally have some blood in her stool but she reports she does have a history of internal hemorrhoids.  She occasionally gets a stinging sensation in her rectum when she tries to have a bowel movement.  She is having cramping peristaltic type pain in her abdomen and does not know why she cannot have a bowel movement.  She is taking prescription pain medication occasionally but tries to use Tylenol most of  the time.  She denies any fevers, vomiting, chest pain, cough or shortness of breath.  The history is provided by the patient and medical records.  Abdominal Pain     Home Medications Prior to Admission medications   Medication Sig Start Date End Date Taking? Authorizing Provider  lactulose (CHRONULAC) 10 GM/15ML solution Take 15 mLs (10 g total) by mouth daily as needed for mild constipation. Start by taking 1 dose daily.  If that does not produce a bowel movement you can increase to 2 times a day and if you still don't have a bowel movement you can increase to 3 times a day 10/09/21  Yes Meckenzie Balsley, Loree Fee, MD  sucralfate (CARAFATE) 1 g tablet Take 1 tablet (1 g total) by mouth 4 (four) times daily -  with meals and at bedtime. 10/09/21  Yes Blanchie Dessert, MD  alendronate (FOSAMAX) 70 MG tablet Take 1 tablet (70 mg total) by mouth every 7 (seven) days. Take with a full glass of water on an empty stomach. 06/09/21 06/09/22  Angelica Pou, MD  atorvastatin (LIPITOR) 40 MG tablet Take 1 tablet (40 mg total) by mouth daily. 08/27/21 08/27/22  Angelica Pou, MD  cetirizine (ZYRTEC ALLERGY) 10 MG tablet Take 1 tablet (10 mg total) by mouth daily as needed for allergies. 07/31/21 07/31/22  Angelica Pou, MD  diltiazem (CARDIZEM) 60 MG tablet Take 1 tablet (60 mg total) by mouth every 6 (six) hours as needed (afib). 08/14/21   Roderic Palau  C, NP  diltiazem (CARTIA XT) 120 MG 24 hr capsule Take 1 capsule (120 mg total) by mouth 2 (two) times daily. 09/18/21   Sherran Needs, NP  docusate sodium (COLACE) 100 MG capsule Take 1 capsule (100 mg total) by mouth daily. Patient taking differently: Take 100 mg by mouth daily as needed for mild constipation. 06/15/21   Nuala Alpha A, PA-C  ezetimibe (ZETIA) 10 MG tablet Take 1 tablet (10 mg total) by mouth daily. 10/30/20 10/30/21  Angelica Pou, MD  famotidine (PEPCID) 10 MG tablet Take 10 mg by mouth 2 (two) times daily as needed  for heartburn or indigestion.    [provider]  fluticasone (FLOVENT HFA) 44 MCG/ACT inhaler Inhale 1 puff into the lungs daily. 09/08/21   Angelica Pou, MD  HYDROcodone-acetaminophen (NORCO/VICODIN) 5-325 MG tablet 1/2 to 1 tablet only when needed for severe pain, as often as every 6 hours. 10/07/21   Angelica Pou, MD  hydrocortisone 1 % ointment Apply topically 3 (three) times daily. 09/24/21   Farrel Gordon, DO  levalbuterol (XOPENEX HFA) 45 MCG/ACT inhaler INHALE 1 PUFF INTO THE LUNGS EVERY 6 HOURS AS NEEDED FOR SHORTNESS OF BREATH Patient taking differently: Inhale 1 puff into the lungs every 6 (six) hours as needed for shortness of breath. 09/14/20   Katsadouros, Vasilios, MD  losartan (COZAAR) 50 MG tablet Take 2 tablets (100 mg total) by mouth daily. 08/27/21 01/09/22  Angelica Pou, MD  montelukast (SINGULAIR) 10 MG tablet Take 1 tablet (10 mg total) by mouth at bedtime. 09/19/20   Angelica Pou, MD  omeprazole (PRILOSEC) 40 MG capsule Take 1 capsule (40 mg total) by mouth in the morning and at bedtime. 09/29/21   Timothy Lasso, MD  ondansetron (ZOFRAN) 4 MG tablet Take 1 tablet (4 mg total) by mouth daily as needed for nausea or vomiting. 10/08/21 10/08/22  Angelica Pou, MD  ondansetron (ZOFRAN-ODT) 4 MG disintegrating tablet Take 1 tablet (4 mg total) by mouth every 8 (eight) hours as needed for nausea or vomiting. 08/09/21   Quintella Reichert, MD  polyethylene glycol (MIRALAX / GLYCOLAX) 17 g packet Take 17 g by mouth 2 (two) times daily. 09/29/21   Timothy Lasso, MD  potassium chloride SA (KLOR-CON M) 10 MEQ tablet Take 1 tablet (10 mEq total) by mouth daily. 08/18/21   Madalyn Rob, MD  rivaroxaban (XARELTO) 20 MG TABS tablet Take 1 tablet (20 mg total) by mouth daily with supper. 09/29/21   Timothy Lasso, MD  senna-docusate (SENOKOT-S) 8.6-50 MG tablet Take 1 tablet by mouth 2 (two) times daily. 09/29/21   Timothy Lasso, MD  simethicone (MYLICON) 80  MG chewable tablet Chew 1 tablet (80 mg total) by mouth 4 (four) times daily as needed for flatulence. 09/24/21   Farrel Gordon, DO  spironolactone (ALDACTONE) 25 MG tablet Take 1 tablet (25 mg total) by mouth daily. 04/17/21 04/17/22  Angelica Pou, MD  tiotropium (SPIRIVA) 18 MCG inhalation capsule Place 1 capsule (18 mcg total) into inhaler and inhale daily. 08/05/21   Angelica Pou, MD      Allergies    Albuterol, Percocet [oxycodone-acetaminophen], Celecoxib, and Tramadol    Review of Systems   Review of Systems  Gastrointestinal:  Positive for abdominal pain.   Physical Exam Updated Vital Signs BP 130/80    Pulse 80    Temp 98.2 F (36.8 C) (Oral)    Resp 16    Ht 5\' 7"  (1.702 m)  Wt 61.2 kg    SpO2 100%    BMI 21.14 kg/m  Physical Exam Vitals and nursing note reviewed. Exam conducted with a chaperone present.  Constitutional:      General: She is not in acute distress.    Appearance: She is well-developed.  HENT:     Head: Normocephalic and atraumatic.  Eyes:     Pupils: Pupils are equal, round, and reactive to light.  Cardiovascular:     Rate and Rhythm: Normal rate and regular rhythm.     Pulses: Normal pulses.     Heart sounds: Normal heart sounds. No murmur heard.   No friction rub.  Pulmonary:     Effort: Pulmonary effort is normal.     Breath sounds: Normal breath sounds. No wheezing or rales.  Abdominal:     General: Bowel sounds are normal. There is no distension.     Palpations: Abdomen is soft.     Tenderness: There is abdominal tenderness in the left upper quadrant and left lower quadrant. There is no right CVA tenderness, left CVA tenderness, guarding or rebound.  Genitourinary:    Rectum: No tenderness.     Comments: No evidence of rectal prolapse at this time.  Small nonbleeding hemorrhoids are present.  No significant pain with rectal exam.  No stool in the rectal vault. Musculoskeletal:        General: No tenderness. Normal range of motion.      Right lower leg: No edema.     Left lower leg: No edema.     Comments: No edema  Skin:    General: Skin is warm and dry.     Findings: No rash.  Neurological:     Mental Status: She is alert and oriented to person, place, and time. Mental status is at baseline.     Cranial Nerves: No cranial nerve deficit.  Psychiatric:        Mood and Affect: Mood normal.        Behavior: Behavior normal.    ED Results / Procedures / Treatments   Labs (all labs ordered are listed, but only abnormal results are displayed) Labs Reviewed  CBC WITH DIFFERENTIAL/PLATELET - Abnormal; Notable for the following components:      Result Value   WBC 3.9 (*)    RBC 3.59 (*)    Hemoglobin 9.7 (*)    HCT 29.6 (*)    RDW 18.1 (*)    All other components within normal limits  COMPREHENSIVE METABOLIC PANEL - Abnormal; Notable for the following components:   Total Bilirubin 0.2 (*)    All other components within normal limits  PROTIME-INR - Abnormal; Notable for the following components:   Prothrombin Time 26.1 (*)    INR 2.4 (*)    All other components within normal limits  SAMPLE TO BLOOD BANK    EKG None  Radiology DG Abdomen 1 View  Result Date: 10/09/2021 CLINICAL DATA:  Left upper quadrant abdominal pain.  Constipation EXAM: ABDOMEN - 1 VIEW COMPARISON:  November 01, 2017. FINDINGS: The bowel gas pattern is normal. Status post cholecystectomy. No radio-opaque calculi or other significant radiographic abnormality are seen. IMPRESSION: No abnormal bowel dilatation. Electronically Signed   By: Marijo Conception M.D.   On: 10/09/2021 08:50    Procedures Procedures    Medications Ordered in ED Medications - No data to display  ED Course/ Medical Decision Making/ A&P  Medical Decision Making Amount and/or Complexity of Data Reviewed External Data Reviewed: notes. Labs: ordered. Decision-making details documented in ED Course. Radiology: ordered and independent  interpretation performed. Decision-making details documented in ED Course.  Risk Prescription drug management.   Patient is a pleasant 71 year old female presenting today with complaints of not being able to have a bowel movement for 5 days and having ongoing abdominal pain.  Patient's abdominal pain has been present since she had the colonoscopy and hemoperitoneum.  She has been eating and drinking and is not ill in appearance today.  She has mild left-sided abdominal pain but no peritoneal signs.  On exam she has no evidence of rectal prolapse but does have hemorrhoids that are nonthrombosed or bleeding at this time.  She has no evidence to suggest fecal impaction today on exam.  Low suspicion for recurrent bleed, diverticulitis, appendicitis, urinary pathology.  Patient is not complaining of symptoms suggestive of obstruction.  Patient's constipation may be from still having to take intermittent pain medication and a history of chronic constipation.  She had tried MiraLAX and reported that was not working and so they changed her to Senokot.  We will get a plain film to ensure that patient has a significant stool burden.  I independently interpreted patient's labs and today her hemoglobin continues to improve is now 9.7, CBC with minimal leukopenia of 3.9 and normal white count.  CMP without acute findings.  Patient is using Anusol cream for her rectum at this time.  External medical records from her recent hospitalization were reviewed.  9:15 AM I independently reviewed and interpreted patient's x-ray and she does have moderate stool over on the right side, no evidence of obstruction.  Findings discussed with the patient and her daughter who is present at bedside.  We will have her try lactulose to see if we can get her bowels moving.  She will hold the MiraLAX and Senokot at this time.  Also she is complaining of a lot of burping and gas in her abdomen despite taking omeprazole which she received during  her last hospitalization because of gastritis on her EGD.  We will add Carafate to see if that helps with improvement.  Discussed with patient the harmful effects that opiates can cause with her ongoing constipation and encouraged her not to use them.  Patient does not meet criteria for admission today and will be discharged home.  She was given return precautions.        Final Clinical Impression(s) / ED Diagnoses Final diagnoses:  Constipation, unspecified constipation type    Rx / DC Orders ED Discharge Orders          Ordered    sucralfate (CARAFATE) 1 g tablet  3 times daily with meals & bedtime        10/09/21 0915    lactulose (CHRONULAC) 10 GM/15ML solution  Daily PRN        10/09/21 0915              Blanchie Dessert, MD 10/09/21 [phone]

## 2021-10-09 NOTE — ED Provider Triage Note (Signed)
Emergency Medicine Provider Triage Evaluation Note  [name] , a 71 y.o. female  was evaluated in triage.  Pt complains of left sided abdominal pain, persistent since her colonoscopy earlier this month. Was recently admitted for hemoperitoneum. Notes onset of reddish color "grainy" substance when attempting a BM tonight; unsure if it was stool. Reports use of daily Xarelto. No melena, urinary symptoms, syncope, fevers.  Review of Systems  Positive: As above Negative: As above  Physical Exam  BP (!) 130/106 (BP Location: Right Arm)    Pulse 82    Temp 98.2 F (36.8 C) (Oral)    Resp 15    Ht 5\' 7"  (1.702 m)    Wt 61.2 kg    SpO2 97%    BMI 21.14 kg/m  Gen:   Awake, no distress   Resp:  Normal effort  MSK:   Moves extremities without difficulty  Other:  Mild LUQ TTP no distension, masses, peritoneal signs.  Medical Decision Making  Medically screening exam initiated at 5:55 AM.  Appropriate orders placed.  [name] was informed that the remainder of the evaluation will be completed by another provider, this initial triage assessment does not replace that evaluation, and the importance of remaining in the ED until their evaluation is complete.  Lower abdominal pain - pending labs or trending.    Antonietta Breach, PA-C 10/09/21 [phone]

## 2021-10-09 NOTE — ED Triage Notes (Signed)
Pt BIB [facility] from home c/o abdominal pain and rectal bleeding for a while. Pt has not had a bowel movement in a week.

## 2021-10-09 NOTE — Discharge Instructions (Signed)
Stop taking the MiraLAX and Senokot at this time.  Try the lactulose.  If for start by just taking it 1 time a day but if you still are not able to have a bowel movement increase it to twice a day and if still no bowel movement increase to 3 times a day.  This medication should make you have a bowel movement.  Once you feel like your bowel movements are more normal you can go back to doing 1 or 2 scoops of MiraLAX daily or Senokot twice a day.  Also continue taking the omeprazole which is the medicine for your stomach but you are also given a prescription of something you can take 30 minutes before meal and before you go to bed to help with the acid and gas.  Try to avoid opiate pain medications as it is going to make your constipation and probably your abdominal pain worse.  If you start having fever, vomiting, severe pain you should return to the emergency room.  Also to help with the pain and discomfort in your rectum sit in a warm bathtub with some Epsom salt or baking soda and soak for 20 minutes.

## 2021-10-12 ENCOUNTER — Telehealth: Payer: Self-pay

## 2021-10-12 NOTE — Telephone Encounter (Addendum)
RTC to patient informed her of the reason she is taking the Carafate to line her esophagus and stomach.  Also medication was stopped because she stated that it made her sick.  Patient said that she would take the medication to see if it will make her feel better if not she will stop .  Call to Santiago Glad her Case worker . Message to call Clinics .

## 2021-10-12 NOTE — Telephone Encounter (Signed)
Call to Santiago Glad about Carafate.  Patient had previously been on and was restarted after ER visit.  Patient stated that she had been told it was for ulcers which she stated she does not have.  Call to patient as well to tell her that a message will be sent to Dr. Jimmye Norman to see if she would like for her to resume the Carafate.

## 2021-10-12 NOTE — Telephone Encounter (Signed)
Pt and case worker is requesting a call back about a medication ..  ( sucralfate (CARAFATE) 1 g tablet)she is wondering if she is to take this medication because she recalls Dr Jimmye Norman had stopped it before .Marland Kitchen      Case worker : Santiago Glad [phone].Marland Kitchen    Case worker would like a call back also

## 2021-10-13 NOTE — Telephone Encounter (Signed)
RTC from [place].  Given information about Carafate.  Patient can take the Carafate and if she has problems can stop per Dr. Jimmye Norman.

## 2021-10-13 NOTE — Assessment & Plan Note (Signed)
Colonoscopy 09/24/21 inpatient (diarrhea, one episode hematochezia) revealed two 3-4 mm polyps in the descending and ascending colon which were removed with a cold snare.  IInternal hemorrhoids also noted.

## 2021-10-13 NOTE — Assessment & Plan Note (Signed)
Hypokalemia is episodic.  Currently requiring potassium supplementation despite being on ARB and spironolactone.  Recheck today.

## 2021-10-13 NOTE — Assessment & Plan Note (Signed)
Well controlled on current regimen.  Continue, monitor.

## 2021-10-15 ENCOUNTER — Ambulatory Visit (INDEPENDENT_AMBULATORY_CARE_PROVIDER_SITE_OTHER): Payer: Medicare Other | Admitting: Internal Medicine

## 2021-10-15 ENCOUNTER — Ambulatory Visit (HOSPITAL_COMMUNITY): Payer: Medicare Other | Admitting: Nurse Practitioner

## 2021-10-15 ENCOUNTER — Telehealth: Payer: Self-pay

## 2021-10-15 DIAGNOSIS — S36039D Unspecified laceration of spleen, subsequent encounter: Secondary | ICD-10-CM

## 2021-10-15 DIAGNOSIS — K219 Gastro-esophageal reflux disease without esophagitis: Secondary | ICD-10-CM | POA: Diagnosis not present

## 2021-10-15 DIAGNOSIS — K623 Rectal prolapse: Secondary | ICD-10-CM | POA: Diagnosis not present

## 2021-10-15 DIAGNOSIS — K5909 Other constipation: Secondary | ICD-10-CM | POA: Diagnosis not present

## 2021-10-15 DIAGNOSIS — I10 Essential (primary) hypertension: Secondary | ICD-10-CM

## 2021-10-15 NOTE — Patient Instructions (Signed)
Ms. Beachem,  I'm so glad that you're doing better.  This bowel problem has certainly been difficult.  For now, you will keep the lactulose as a back up, but will go back to Sennokot pills once or twice a day (you can adjust it) and Miralax every day.  It does take some adjustment to get it right just for you.  There isn't any medicine that can shrink the rectum tissues.  With time, and normal bowel movements, it should resolve on its own.  You're going to be ok!  Let's get together in a month to make sure all is well.  I hope your H pylori infection treatment takes care of your digestive symptoms.  I'm feeling good about it.  Dr. Jimmye Norman

## 2021-10-15 NOTE — Progress Notes (Signed)
ED f/u 5 days after ED visit for constipation and concern about possible hemorrhoids.    Significant recent hx of problems with bowels -  multiple calls to our clinic regarding bowel habits (too much/too little), and I thought she was doing better with daily senokot and daily miralax, though for some reason she stopped having BMS for several days and presented to ED.    Changes made there:  Stop Senokot and Miralax Add Carafate qid for burping and nausea Begin lactulose until bowels are regular then stop lactulose and go back to Miralax and/or Sennakot.  She is having daily BMs and feels much better with the lactulose; has not yet resumed the miralax or the sennokot.  Concerned that she is having hemorrhoids and requests exam.  GI appt Monday to address thse problems and new dx of H pylori, treatment plan pending visit.  Needs hemorrhoid cream.  BP 125/73 (BP Location: Right Arm, Patient Position: Sitting, Cuff Size: Small)    Pulse 70    Temp 97.6 F (36.4 C) (Oral)    Ht 5\' 7"  (1.702 m)    Wt 131 lb 4.8 oz (59.6 kg)    SpO2 100%    BMI 20.56 kg/m   Exam: Nicely dressed and groomed, very comfortable and relaxed today.  Rectal exam:  A few small external hemorrhoidal tags.  No active external hemorrhoids.  Rectum appears to have very early circumferential prolapse - bearing down did not cause worsening.    Assessment and plan:  Ongoing epigastric symptoms (pain, burning, belching, gas) is most likely due to the H. pylori infection, the treatment of which will be discussed with her gastroenterologist at her appointment next week.  She is confused about the Carafate-at our last appointment, she asked to stop it, as she associated with making her feel worse.  This was restarted by the emergency room physician at last visit.  Because it had not helped relieve her epigastric symptoms, I advised that she eliminate it to avoid confusion.  She continues to have irregular bowel pattern.  Has had  good results with daily lactulose, during which time she was advised to stop the MiraLAX and the Senokot which she indeed has done.  She is now confused when she should use the lactulose versus the MiraLAX versus the Senokot.  Now that she is having regular bowel movements, I advised her to take MiraLAX every morning and a Senokot every night.  If she is not having bowel movements, she can then continue the daily MiraLAX but increase her Senokot to 1 tablet twice a day.  Unfortunately, she does not remember our extensive discussion in the notes I wrote for her regarding the last discussion about how to use her bowel regimen.  She is not straining, though appears to be developing early rectal prolapse.  For irritation she may use Anusol HC suppositories.  She wanted a medicine to "make it go away".  I will advise Dr. Havery Moros.  Has appt with cardiology on 10/22/21.  I will see her again in about a month.

## 2021-10-15 NOTE — Telephone Encounter (Signed)
tiotropium (SPIRIVA) 18 MCG inhalation capsule, REFILL REQUEST@ [id number] - [place], [place] - [address].

## 2021-10-16 ENCOUNTER — Other Ambulatory Visit: Payer: Self-pay | Admitting: Internal Medicine

## 2021-10-16 DIAGNOSIS — K649 Unspecified hemorrhoids: Secondary | ICD-10-CM

## 2021-10-16 DIAGNOSIS — J4520 Mild intermittent asthma, uncomplicated: Secondary | ICD-10-CM

## 2021-10-16 MED ORDER — TIOTROPIUM BROMIDE MONOHYDRATE 18 MCG IN CAPS: 18.0000 ug | ORAL_CAPSULE | Freq: Every day | RESPIRATORY_TRACT | 3 refills | Status: DC

## 2021-10-16 MED ORDER — HYDROCORTISONE ACETATE 25 MG RE SUPP: 25.0000 mg | Freq: Two times a day (BID) | RECTAL | 0 refills | Status: AC | PRN

## 2021-10-16 NOTE — Assessment & Plan Note (Signed)
Ongoing problems, resulting in hospitalization early 2023.  She is having difficulty understanding instructions for a bowel regimen.  At this time, she is advised to take Senokot 1 tab nightly, MiraLAX each morning, and to use the lactulose dispensed at the recent emergency room appointment only when absolutely needed.  While not currently straining, it appears on exam that she is developing early prolapse.  This will be shared with Dr. Enis Gash who she will see next week.

## 2021-10-16 NOTE — Assessment & Plan Note (Signed)
Pain significantly improved.  Monitor.

## 2021-10-16 NOTE — Assessment & Plan Note (Signed)
Well-controlled on current regimen, continue and monitor.

## 2021-10-16 NOTE — Assessment & Plan Note (Signed)
She has been taking twice daily PPI and Carafate was not effective.  EGD  09/24/2021 by Dr. Lorenso Courier revealed normal esophagus.  Localized inflammation characterized by congestion and erosions and erythema was found in the gastric antrum.  Biopsies later revealed H. pylori which will be addressed by Dr. Havery Moros the last week of February.  She continues on twice daily PPI.  Carafate was ineffective and has been stopped.  It is thought that her epigastric symptoms are largely due to the H. pylori rather than to GERD at this time.

## 2021-10-19 ENCOUNTER — Other Ambulatory Visit: Payer: Self-pay

## 2021-10-19 ENCOUNTER — Ambulatory Visit (INDEPENDENT_AMBULATORY_CARE_PROVIDER_SITE_OTHER): Payer: Medicare Other | Admitting: Internal Medicine

## 2021-10-19 ENCOUNTER — Ambulatory Visit (INDEPENDENT_AMBULATORY_CARE_PROVIDER_SITE_OTHER): Payer: Medicare Other | Admitting: Gastroenterology

## 2021-10-19 ENCOUNTER — Encounter: Payer: Self-pay | Admitting: Gastroenterology

## 2021-10-19 VITALS — BP 110/70 | HR 80 | Ht 67.0 in | Wt 131.6 lb

## 2021-10-19 DIAGNOSIS — K649 Unspecified hemorrhoids: Secondary | ICD-10-CM

## 2021-10-19 DIAGNOSIS — U071 COVID-19: Secondary | ICD-10-CM

## 2021-10-19 DIAGNOSIS — A048 Other specified bacterial intestinal infections: Secondary | ICD-10-CM | POA: Diagnosis not present

## 2021-10-19 DIAGNOSIS — S3600XS Unspecified injury of spleen, sequela: Secondary | ICD-10-CM | POA: Diagnosis not present

## 2021-10-19 DIAGNOSIS — D649 Anemia, unspecified: Secondary | ICD-10-CM

## 2021-10-19 DIAGNOSIS — B349 Viral infection, unspecified: Secondary | ICD-10-CM | POA: Diagnosis not present

## 2021-10-19 DIAGNOSIS — K625 Hemorrhage of anus and rectum: Secondary | ICD-10-CM

## 2021-10-19 DIAGNOSIS — K5900 Constipation, unspecified: Secondary | ICD-10-CM

## 2021-10-19 HISTORY — DX: COVID-19: U07.1

## 2021-10-19 MED ORDER — CROMOLYN SODIUM 5.2 MG/ACT NA AERS: 1.0000 | INHALATION_SPRAY | Freq: Three times a day (TID) | NASAL | 2 refills | Status: DC | PRN

## 2021-10-19 MED ORDER — CETIRIZINE HCL 10 MG PO TABS: 10.0000 mg | ORAL_TABLET | Freq: Every day | ORAL | 3 refills | Status: DC | PRN

## 2021-10-19 MED ORDER — POTASSIUM CHLORIDE CRYS ER 10 MEQ PO TBCR: 10.0000 meq | EXTENDED_RELEASE_TABLET | Freq: Every day | ORAL | 0 refills | Status: DC

## 2021-10-19 MED ORDER — LORATADINE 10 MG PO TABS: 10.0000 mg | ORAL_TABLET | Freq: Every day | ORAL | 0 refills | Status: DC

## 2021-10-19 MED ORDER — LINACLOTIDE 145 MCG PO CAPS: 145.0000 ug | ORAL_CAPSULE | Freq: Every day | ORAL | 0 refills | Status: DC

## 2021-10-19 NOTE — Patient Instructions (Addendum)
If you are age 74 or older, your body mass index should be between 23-30. Your Body mass index is 20.61 kg/m. If this is out of the aforementioned range listed, please consider follow up with your Primary Care Provider.  If you are age 46 or younger, your body mass index should be between 19-25. Your Body mass index is 20.61 kg/m. If this is out of the aformentioned range listed, please consider follow up with your Primary Care Provider.   ________________________________________________________  The [facility] providers would like to encourage you to use [facility] to communicate with providers for non-urgent requests or questions.  Due to long hold times on the telephone, sending your provider a message by [facility] may be a faster and more efficient way to get a response.  Please allow 48 business hours for a response.  Please remember that this is for non-urgent requests.  _______________________________________________________  Take a double dose of Miralax twice a day.  Stop the lactulose and the senna.    Please purchase the following medications over the counter and take as directed: Calmol 4 suppositories:  Use as directed.  We are giving you samples of Linzess 145 mcg to take as needed for your constipation IF the double dose of Miralax is not working.  Continue omeprazole twice a day.  We will discuss H pylori treatment in 2 weeks. We will call you to discuss.   Thank you for entrusting me with your care and for choosing [facility], Dr. Waubeka Cellar

## 2021-10-19 NOTE — Assessment & Plan Note (Signed)
Patient has had a two day history of runny nose and watery eyes. Still able to complete ADLs, no fever, no GI symptoms, no shortness of breath. Suspect that this is either a virus vs seasonal allergies.  Plan: - cromolyn sodium nasal spray, zyrtec for symptoms - recommended patient take COVID test at pharmacy and contact us if she tests positive - recommend paxlovid if she tests positive

## 2021-10-19 NOTE — Progress Notes (Signed)
HPI :  71 year old female here for follow-up visit posthospitalization for multiple issues.  Recall she is initially noted to the hospital on January 31 and our service was consulted.  She presented with diarrhea, abdominal pain, FOBT positive stool in the setting of worsening anemia.  This was while she was on Linzess twice daily dosing MiraLAX.  She had a CT scan on January 31 which showed some rectal wall thickening and perirectal fat stranding consistent with proctitis.  She had an associated anemia with this which was heme positive she underwent EGD and colonoscopy with Dr. Lorenso Courier as outlined below while the Xarelto was held  EGD 09/24/2021: Normal esophagus Localized inflammation characterized by congestion (edema), erosions and erythema was found in the gastric antrum. Biopsies were taken with a cold forceps for histology. The examined duodenum was normal.   Colonoscopy 09/24/2021: The examined portion of the ileum was normal. - Two 3 to 4 mm polyps in the descending colon and in the ascending colon, removed with a cold snare. Resected and retrieved. - Erythematous mucosa in the rectum, in the sigmoid colon, in the descending colon and in the transverse colon. Biopsied. - Non-bleeding internal hemorrhoids.  FINAL MICROSCOPIC DIAGNOSIS:   A. STOMACH, BIOPSY:  -  Active chronic gastritis with associated Helicobacter pylori  infection (H. pylori organisms visible on HE).  -  Negative for malignancy.  -  Focal intestinal metaplasia.   B. COLON, DESCENDING AND ASCENDING, POLYPECTOMY:  -  Focal mild polypoid crypt alteration suggestive of hyperplastic polyp  goblet-cell rich type.  -  Mucosal lymphoid aggregate.  -  Negative for adenomatous change and polypoid epithelial serration.  -  Negative for malignancy.   C. COLON, RANDOM, BIOPSY:  -  Focal mild active colitis.  -  Negative for granulomas and crypt architectural distortion.  -  Negative CMV immunohistochemical (IHC)  stain.  -  Negative for adenomatous change and polypoid epithelial serration.  -  Negative for malignancy.   D. RECTUM, BIOPSY:  -  Colorectal mucosa with no specific pathologic diagnosis.  -  Negative for active proctitis.  -  Negative for adenomatous change and polypoid epithelial serration.  -  Negative for malignancy.   She unfortunately was readmitted to the hospital few days later on February 5. At that point time she complained of abdominal pain and rectal bleeding.  She had a hemoglobin drop to 8.3 from 10.7 CT scan on admission showed hemoperitoneum near the spleen and in the pelvis, spleen without obvious splenic laceration but favored to the source of the hemoperitoneum, redundant large bowel with retained stool.  She was managed conservatively, no surgery needed, she was monitored and eventually discharged.  It was thought that her rectal bleeding was due to moderate bleeding in the setting of Xarelto.  She returns today complaining of a few issues.  She has had some recurrent constipation since she has been discharged and it continues to bother her.  So like she is on MiraLAX once daily with senna twice daily.  Upon discharge she had stopped the Linzess that she previously had diarrhea on that regimen.  At some point along the line she returned to the ER for worsening constipation, to stop the Senokot and MiraLAX, added Carafate for nausea and gave lactulose for her constipation.  She took the lactulose for 3 days and states she had some bowel movements but then it stopped working is making her very upset so she stopped it she is really not sure what  she should take at this point time.  She has had some irritation of her rectum and hemorrhoid symptoms from the constipation.  Was prescribed Anusol but never took it, states having a hard time getting it from the pharmacy.  Her abdominal pain from her spleen injury continues to improve and she states that is generally getting better.  She is  eating okay, on omeprazole 40 mg twice daily which she states is working to control her reflux symptoms.  She does have some epigastric discomfort at times with some belching.  It sounds like she was given some Carafate at some point time which she is not sure if it changes anything.  No nausea or vomiting.  She has not yet been treated for H. pylori given her upset stomach it was thought that she may have tolerated the antibiotics around the time of her hospitalization where she recovered from her splenic injury.   Past Medical History:  Diagnosis Date   Asthma in adult, mild intermittent, uncomplicated    Chronic anticoagulation    Chronic constipation    Eczema    GERD (gastroesophageal reflux disease)    History of kidney stones    Hx of vaginal bleeding, resolved, ultrasound completed 10/11/2018   Hypertension    Personal history of atrial fibrillation, s/p ablation, now in NSR but remains on anticoaulation 11/26/2015   Followed in cardiology clinic      Past Surgical History:  Procedure Laterality Date   ATRIAL FIBRILLATION ABLATION N/A 09/30/2017   Procedure: ATRIAL FIBRILLATION ABLATION;  Surgeon: Thompson Grayer, MD;  Location: [facility];  Service: Cardiovascular;  Laterality: N/A;   BIOPSY  09/24/2021   Procedure: BIOPSY;  Surgeon: Sharyn Creamer, MD;  Location: [place];  Service: Gastroenterology;;   CHOLECYSTECTOMY N/A 12/08/2018   Procedure: LAPAROSCOPIC CHOLECYSTECTOMY WITH INTRAOPERATIVE CHOLANGIOGRAM;  Surgeon: Jovita Kussmaul, MD;  Location: [place];  Service: General;  Laterality: N/A;   COLONOSCOPY WITH PROPOFOL N/A 09/24/2021   Procedure: COLONOSCOPY WITH PROPOFOL;  Surgeon: Sharyn Creamer, MD;  Location: [place];  Service: Gastroenterology;  Laterality: N/A;   ERCP N/A 12/09/2018   Procedure: ENDOSCOPIC RETROGRADE CHOLANGIOPANCREATOGRAPHY (ERCP);  Surgeon: Carol Ada, MD;  Location: [place];  Service: Endoscopy;  Laterality: N/A;    ESOPHAGOGASTRODUODENOSCOPY (EGD) WITH PROPOFOL N/A 09/24/2021   Procedure: ESOPHAGOGASTRODUODENOSCOPY (EGD) WITH PROPOFOL;  Surgeon: Sharyn Creamer, MD;  Location: [place];  Service: Gastroenterology;  Laterality: N/A;   POLYPECTOMY  09/24/2021   Procedure: POLYPECTOMY;  Surgeon: Sharyn Creamer, MD;  Location: [facility] ENDOSCOPY;  Service: Gastroenterology;;   Joan Mayans  12/09/2018   Procedure: Joan Mayans;  Surgeon: Carol Ada, MD;  Location: [facility] ENDOSCOPY;  Service: Endoscopy;;   TONSILLECTOMY     TUBAL LIGATION     Family History  Problem Relation Age of Onset   Heart attack Mother        Young age   Breast cancer Sister        Late 38s; now s/p mastectomy    Lung cancer Brother        x 2   Cancer Sister    Cancer Sister    Cancer Sister    Colon cancer Neg Hx    Colon polyps Neg Hx    Esophageal cancer Neg Hx    Rectal cancer Neg Hx    Stomach cancer Neg Hx    Social History   Tobacco Use   Smoking status: Former    Types: Cigarettes  Quit date: 10/24/2015    Years since quitting: 5.9   Smokeless tobacco: Never  Vaping Use   Vaping Use: Never used  Substance Use Topics   Alcohol use: Not Currently   Drug use: Not Currently    Types: Marijuana    Comment: Smoked marijuana in the past.   Current Outpatient Medications  Medication Sig Dispense Refill   alendronate (FOSAMAX) 70 MG tablet Take 1 tablet (70 mg total) by mouth every 7 (seven) days. Take with a full glass of water on an empty stomach. 12 tablet 3   atorvastatin (LIPITOR) 40 MG tablet Take 1 tablet (40 mg total) by mouth daily. 90 tablet 3   cetirizine (ZYRTEC) 10 MG tablet Take 10 mg by mouth daily.     diltiazem (CARDIZEM) 60 MG tablet Take 1 tablet (60 mg total) by mouth every 6 (six) hours as needed (afib). 45 tablet 1   diltiazem (CARTIA XT) 120 MG 24 hr capsule Take 1 capsule (120 mg total) by mouth 2 (two) times daily. 180 capsule 2   docusate sodium (COLACE) 100 MG capsule Take 1 capsule (100  mg total) by mouth daily. (Patient taking differently: Take 100 mg by mouth daily as needed for mild constipation.) 14 capsule 0   ezetimibe (ZETIA) 10 MG tablet Take 1 tablet (10 mg total) by mouth daily. 30 tablet 11   fluticasone (FLOVENT HFA) 44 MCG/ACT inhaler Inhale 1 puff into the lungs daily. 30.8 g 1   levalbuterol (XOPENEX HFA) 45 MCG/ACT inhaler INHALE 1 PUFF INTO THE LUNGS EVERY 6 HOURS AS NEEDED FOR SHORTNESS OF BREATH (Patient taking differently: Inhale 1 puff into the lungs every 6 (six) hours as needed for shortness of breath.) 45 g 1   losartan (COZAAR) 50 MG tablet Take 2 tablets (100 mg total) by mouth daily. 135 tablet 3   montelukast (SINGULAIR) 10 MG tablet Take 1 tablet (10 mg total) by mouth at bedtime. 90 tablet 3   omeprazole (PRILOSEC) 40 MG capsule Take 1 capsule (40 mg total) by mouth in the morning and at bedtime. 90 capsule 1   ondansetron (ZOFRAN) 4 MG tablet Take 1 tablet (4 mg total) by mouth daily as needed for nausea or vomiting. 12 tablet 1   polyethylene glycol (MIRALAX / GLYCOLAX) 17 g packet Take 17 g by mouth 2 (two) times daily. 14 each 0   potassium chloride (KLOR-CON M) 10 MEQ tablet Take 1 tablet (10 mEq total) by mouth daily. 60 tablet 0   rivaroxaban (XARELTO) 20 MG TABS tablet Take 1 tablet (20 mg total) by mouth daily with supper. 30 tablet 11   spironolactone (ALDACTONE) 25 MG tablet Take 1 tablet (25 mg total) by mouth daily. 90 tablet 3   tiotropium (SPIRIVA) 18 MCG inhalation capsule Place 1 capsule (18 mcg total) into inhaler and inhale daily. 90 capsule 3   cromolyn (NASALCROM) 5.2 MG/ACT nasal spray Place 1 spray into both nostrils 3 (three) times daily as needed for up to 7 days for rhinitis. 26 mL 2   hydrocortisone (ANUSOL-HC) 25 MG suppository Place 1 suppository (25 mg total) rectally 2 (two) times daily as needed (itching, stinging, and irritation of hemorrhoids). 12 suppository 0   lactulose (CHRONULAC) 10 GM/15ML solution Take 15 mLs (10  g total) by mouth daily as needed for mild constipation. Start by taking 1 dose daily.  If that does not produce a bowel movement you can increase to 2 times a day and if you still  don't have a bowel movement you can increase to 3 times a day (Patient not taking: Reported on 10/19/2021) 236 mL 0   senna-docusate (SENOKOT-S) 8.6-50 MG tablet Take 1 tablet by mouth 2 (two) times daily. (Patient not taking: Reported on 10/19/2021) 30 tablet 0   sucralfate (CARAFATE) 1 g tablet Take 1 tablet (1 g total) by mouth 4 (four) times daily -  with meals and at bedtime. (Patient not taking: Reported on 10/19/2021) 90 tablet 1   No current facility-administered medications for this visit.   Allergies  Allergen Reactions   Albuterol Palpitations    Caused afib    Percocet [Oxycodone-Acetaminophen] Shortness Of Breath, Itching and Anxiety    Makes jittery, difficulty breathing, itching   Celecoxib Rash   Tramadol Palpitations    Ok to trial again 09/29/2021 and f/u HR     Review of Systems: All systems reviewed and negative except where noted in HPI.    DG Abdomen 1 View  Result Date: 10/09/2021 CLINICAL DATA:  Left upper quadrant abdominal pain.  Constipation EXAM: ABDOMEN - 1 VIEW COMPARISON:  November 01, 2017. FINDINGS: The bowel gas pattern is normal. Status post cholecystectomy. No radio-opaque calculi or other significant radiographic abnormality are seen. IMPRESSION: No abnormal bowel dilatation. Electronically Signed   By: Marijo Conception M.D.   On: 10/09/2021 08:50   CT Abdomen Pelvis W Contrast  Addendum Date: 09/27/2021   ADDENDUM REPORT: 09/27/2021 09:40 ADDENDUM: Study discussed by telephone with Dr. Marda Stalker on 09/27/2021 at 0926 hours. Electronically Signed   By: Genevie Ann M.D.   On: 09/27/2021 09:40   Result Date: 09/27/2021 CLINICAL DATA:  71 year old female with abdominal pain. Status post colonoscopy several days ago. EXAM: CT ABDOMEN AND PELVIS WITH CONTRAST TECHNIQUE: Multidetector  CT imaging of the abdomen and pelvis was performed using the standard protocol following bolus administration of intravenous contrast. RADIATION DOSE REDUCTION: This exam was performed according to the departmental dose-optimization program which includes automated exposure control, adjustment of the mA and/or kV according to patient size and/or use of iterative reconstruction technique. CONTRAST:  14mL OMNIPAQUE IOHEXOL 300 MG/ML  SOLN COMPARISON:  CT Abdomen and Pelvis 09/22/2021 and earlier. FINDINGS: Lower chest: Increased elevation of the right hemidiaphragm with posterolateral basal segment lung opacity indeterminate for atelectasis versus infection. 5 mm right lower lobe lung nodule on series 6, image 7 was subtle last month and present on a 2019 cardiac CT, stable and benign. No pericardial or pleural effusion. Mild left lung base atelectasis. Hepatobiliary: Stable multiple circumscribed hepatic cysts. Surgically absent gallbladder. Small linear area of inferior right lobe hypoenhancement appears unchanged since since last year and benign. Pancreas: Negative. Chronic enlargement of the pancreatic duct in the head and uncinate appears stable since last year. Spleen: Small volume of perisplenic fluid in cleat, including caudal to the spleen, with complex fluid density consistent with hemoperitoneum. At the same time, there is no discrete splenic laceration. No convincing splenic contusion. Adrenals/Urinary Tract: Adrenal glands and kidneys appear stable and within normal limits. Unremarkable bladder. Chronic pelvic phleboliths. No urinary calculus identified. Symmetric renal contrast excretion. Stomach/Bowel: Decompressed rectum and sigmoid colon now with a more normal appearance compared to 09/22/2021. Redundant sigmoid tracking into the left upper quadrant. Decompressed descending colon and splenic flexure. Redundant transverse colon with retained stool. Similar retained stool in the right colon. No acute  large bowel wall thickening identified. Normal appendix suspected on coronal image 41. Negative terminal ileum. No dilated small bowel. Decompressed  stomach. Duodenum is decompressed and does appear to cross midline although proximal small bowel loops appear to be in the right upper quadrant. No free air. No free fluid identified in the mesentery. Vascular/Lymphatic: Extensive Aortoiliac calcified atherosclerosis. Mildly tortuous abdominal aorta. Major arterial structures remain patent. Portal venous system is patent. No lymphadenopathy identified. Reproductive: Stable and within normal limits. Other: Complex fluid, hemoperitoneum in the pelvis, small to moderate volume (series 4, image 61) Musculoskeletal: Advanced lumbar disc and endplate degeneration redemonstrated. No acute or suspicious osseous abnormality identified. IMPRESSION: 1. Positive for Hemoperitoneum - about the spleen and layering in the pelvis, small to moderate volume. No free air or discrete bowel injury identified. Acute splenic injury felt to be the most likely source of bleeding in this setting, despite no obvious splenic laceration. 2. Redundant large bowel with retained stool. Regression of rectosigmoid colon wall thickening since last month. 3. Increased elevation of the right hemidiaphragm with right lung base opacity indeterminate for atelectasis versus infection. Right lower lobe lung nodule stable since 2019 and benign (no follow-up indicated). 4. Aortic Atherosclerosis (ICD10-I70.0). Electronically Signed: By: Genevie Ann M.D. On: 09/27/2021 09:15   CT ABDOMEN PELVIS W CONTRAST  Result Date: 09/22/2021 CLINICAL DATA:  Severe abdominal pain, nausea and vomiting, diarrhea EXAM: CT ABDOMEN AND PELVIS WITH CONTRAST TECHNIQUE: Multidetector CT imaging of the abdomen and pelvis was performed using the standard protocol following bolus administration of intravenous contrast. RADIATION DOSE REDUCTION: This exam was performed according to the  departmental dose-optimization program which includes automated exposure control, adjustment of the mA and/or kV according to patient size and/or use of iterative reconstruction technique. CONTRAST:  48mL OMNIPAQUE IOHEXOL 300 MG/ML  SOLN COMPARISON:  08/29/2021 FINDINGS: Lower chest: No acute pleural or parenchymal lung disease. Hepatobiliary: Stable hepatic cysts. Stable transient hepatic attenuation difference inferior right lobe liver, normalizing on delayed images and stable since prior study. No other focal liver abnormalities. No intrahepatic duct dilation. Gallbladder is surgically absent. Pancreas: Unremarkable. No pancreatic ductal dilatation or surrounding inflammatory changes. Spleen: Normal in size without focal abnormality. Adrenals/Urinary Tract: Adrenal glands are unremarkable. Kidneys are normal, without renal calculi, focal lesion, or hydronephrosis. Bladder is decompressed, limiting its evaluation. Stomach/Bowel: No bowel obstruction or ileus. Normal appendix right lower quadrant. There is moderate rectal wall thickening which may reflect proctitis. Mild wall thickening elsewhere throughout the transverse and descending colon likely due to decompressed state. Vascular/Lymphatic: Aortic atherosclerosis. No enlarged abdominal or pelvic lymph nodes. Reproductive: Uterus and bilateral adnexa are unremarkable. Other: No free fluid or free intraperitoneal gas. Mild perirectal fat stranding. No abdominal wall hernia. Musculoskeletal: No acute or destructive bony lesions. Reconstructed images demonstrate no additional findings. IMPRESSION: 1. Rectal wall thickening and mild perirectal fat stranding, consistent with proctitis. 2. Otherwise no acute intra-abdominal or intrapelvic process. 3.  Aortic Atherosclerosis (ICD10-I70.0). Electronically Signed   By: Randa Ngo M.D.   On: 09/22/2021 15:45    Lab Results  Component Value Date   WBC 3.9 (L) 10/09/2021   HGB 9.7 (L) 10/09/2021   HCT 29.6 (L)  10/09/2021   MCV 82.5 10/09/2021   PLT 375 10/09/2021    Lab Results  Component Value Date   CREATININE 0.80 10/09/2021   BUN 12 10/09/2021   NA 136 10/09/2021   K 3.6 10/09/2021   CL 104 10/09/2021   CO2 23 10/09/2021    Lab Results  Component Value Date   ALT 36 10/09/2021   AST 27 10/09/2021   ALKPHOS 63 10/09/2021  BILITOT 0.2 (L) 10/09/2021     Physical Exam: BP 110/70    Pulse 80    Ht 5\' 7"  (1.702 m)    Wt 131 lb 9.6 oz (59.7 kg)    BMI 20.61 kg/m  Constitutional: Pleasant,well-developed, female in no acute distress. Abdominal: Soft, nondistended, nontender. Bowel sounds active throughout. There are no masses palpable. No hepatomegaly. Anoscopy / DRE - no mass lesions, prolapsed hemorrhoids, anoscopy showed internal hemorrhoids Extremities: no edema Lymphadenopathy: No cervical adenopathy noted. Neurological: Alert and oriented to person place and time. Skin: Skin is warm and dry. No rashes noted. Psychiatric: Normal mood and affect. Behavior is normal.   ASSESSMENT AND PLAN: 71 year old female here for reassessment of the following:  Constipation Rectal bleeding Hemorrhoids H pylori infection Anemia  Splenic injury  Recent hospitalizations x2 as above.  Previously with heme positive with a low ferritin of 10, normal iron level.  EGD and colonoscopy as above, gastritis which could be contributing to iron deficiency has mild.  Had severe constipation, causing related bleeding hemorrhoids.  Hemorrhoids not bothering her too much at this time but she does appear to have some intermittently prolapsing hemorrhoids on anoscopy.  Discussed her course to date.  Unfortunately she had a splenic injury that could have been related to her colonoscopy.  Hospitalized and managed conservatively for that and that appears to be improving with some time, would continue to monitor that but overall she is improving from that standpoint.  We discussed her constipation and  regimen today.  She has had several changes in recent weeks.  Most recently on lactulose which worked for a bit but then states she does not tolerate it and was not working either and wants to stop that.  She does tolerate MiraLAX but was using only low-dose.  We discussed titrating this up to double dose every morning and every afternoon to see if that will help.  I also reintroduced her Linzess 145 mcg to use as needed, she has been on that as an outpatient with some success, may likely need to combine that with higher dose MiraLAX at this point time.  She is agreeable to do that.  She can use Calmol 4 suppositories as needed for her hemorrhoids.  In regards to her very mild iron deficiency, perhaps due to H. pylori.  Colonoscopy without clear cause and no small bowel abnormality on imaging.  She needs treatment for H. pylori but while she is recovering from splenic injury and treating constipation I do not think she will tolerate a complex antibiotic regimen at this time.  We may consider using to Talicia or Pylera to simplify her treatment regimen. We will touch base with her in about 2 weeks or so to see if she is feeling better and if so we will plan on starting that soon.  She agrees  Plan: - stop lactulose - stopped working and having intolerance - resume MIralax at high dose - 2 caps BID - can titrate up or down as needed - resume Linzess 170mcg / day PRN - Calmol4 suppositories PRN - continue omeprazole BID - needs H pylori treatment as above - we will reach out to her in 2 weeks or so to see if she is ready to start a regimen at that time - will need to trend Hgb over upcoming weeks - with more time her abdominal pain should improve from splenic injury  She agrees with the plan all questions answered.  Jolly Mango, MD [facility]  CC: Angelica Pou, MD

## 2021-10-19 NOTE — Progress Notes (Signed)
°  [facility] Residency Telephone Encounter Continuity Care Appointment  HPI:  This telephone encounter was created for Ms. [name] on 10/19/2021 for the following purpose/cc runny nose Patient says that she started feeling sick two days ago and she thinks she caught a cold. Her eyes are watering and she has a runny nose and a headache. She says she is coughing occasionally but not bringing up any mucous. She is vaccinated for both flu and COVID. Denies any other sick contacts. No fever, chills, GI symptoms, decreased urination, or difficulty completing her activities of daily living. Really just wants to know what she can take for this cold safely.   Past Medical History:  Past Medical History:  Diagnosis Date   Asthma in adult, mild intermittent, uncomplicated    Chronic anticoagulation    Chronic constipation    Eczema    GERD (gastroesophageal reflux disease)    History of kidney stones    Hx of vaginal bleeding, resolved, ultrasound completed 10/11/2018   Hypertension    Personal history of atrial fibrillation, s/p ablation, now in NSR but remains on anticoaulation 11/26/2015   Followed in cardiology clinic      ROS:  Positive for congestion, watery eyes, cough, headache Negative for fever, chills, n/v/d   Assessment / Plan / Recommendations:  Please see A&P under problem oriented charting for assessment of the patient's acute and chronic medical conditions.  As always, pt is advised that if symptoms worsen or new symptoms arise, they should go to an urgent care facility or to to ER for further evaluation.   Consent and Medical Decision Making:  Patient discussed with Dr. Dareen Piano This is a telephone encounter between CARNELL CASAMENTO and Zeddie Njie on 10/19/2021 for cold symptoms. The visit was conducted with the patient located at home and [facility]. The patient's identity was confirmed using their DOB and current address. The patient has  consented to being evaluated through a telephone encounter and understands the associated risks (an examination cannot be done and the patient may need to come in for an appointment) / benefits (allows the patient to remain at home, decreasing exposure to coronavirus). I personally spent 12 minutes on medical discussion.

## 2021-10-20 ENCOUNTER — Telehealth: Payer: Self-pay | Admitting: Gastroenterology

## 2021-10-20 DIAGNOSIS — Z20822 Contact with and (suspected) exposure to covid-19: Secondary | ICD-10-CM | POA: Diagnosis not present

## 2021-10-20 NOTE — Telephone Encounter (Signed)
Called and left message for patient that [facility] DID have her script for Anusol suppositories but that her insurance will not cover them.  I let her know that if she can't find the Calmol 4 suppositories locally she can order them online at [facility], [facility], [facility] or [facility].

## 2021-10-20 NOTE — Telephone Encounter (Signed)
Patient called and said she went to two different pharmacies and could not find the calmol suppositories Dr. Havery Moros wanted her to use.  She wants to know if there is anywhere else to find it or if there is something else she should take.  Please call and advise.  Thank you.

## 2021-10-20 NOTE — Progress Notes (Signed)
[facility] Medicine Clinic Attending ° °Case discussed with Dr. DeMaio  At the time of the visit.  We reviewed the resident’s history and exam and pertinent patient test results.  I agree with the assessment, diagnosis, and plan of care documented in the resident’s note. ° ° °

## 2021-10-21 ENCOUNTER — Telehealth: Payer: Self-pay | Admitting: *Deleted

## 2021-10-21 NOTE — Telephone Encounter (Signed)
Call from [facility] who stated pt would like HHPT.I asked [name] why pt needs HHPT - he transferred call to the pt.  ?[name] stated she was receiving PT in between hospitalizations.I asked pt if she's home bound - she stated no . Stated she just wanted to informed the person who came to see her for PT that she's not in the hospital. But she stated she's doing fine; she can do the exercises herself and there is no needs for PT at this time. ?

## 2021-10-22 ENCOUNTER — Telehealth: Payer: Self-pay

## 2021-10-22 ENCOUNTER — Telehealth: Payer: Self-pay | Admitting: Internal Medicine

## 2021-10-22 ENCOUNTER — Ambulatory Visit (HOSPITAL_COMMUNITY): Payer: Medicare Other | Admitting: Nurse Practitioner

## 2021-10-22 NOTE — Telephone Encounter (Signed)
Return call from pt - stated she tested + for covid (test done on 2/27) and was told by the doctor (telehealth on 2/27)medication would be in call in to the pharmacy. I asked about her symptoms -   having body aches, h/a's, slight cough with "phlegm in her throat", and feels warm (has not taken her temperature). Pt stated she does not need to schedule another telehealth appt to discuss her symptoms b/c the doctor told to call back if test is positive and they would order her some medications. ?

## 2021-10-22 NOTE — Telephone Encounter (Signed)
Santiago Glad from [facility] called regarding the patient's worsening covid symptoms such as headaches,body aches and pain in her legs and her arms. Patient is requesting rx to be called in for covid treatment Santiago Glad [phone] or call Mrs.Leventhal [phone] ?

## 2021-10-22 NOTE — Telephone Encounter (Signed)
Return call to Santiago Glad to let her know I have already talked to pt and message has been sent to her doctor. ?

## 2021-10-22 NOTE — Telephone Encounter (Signed)
RTC to patient x 2.  Message left that the Clinics had returned her call.   ?

## 2021-10-22 NOTE — Telephone Encounter (Signed)
Pt calling to report she did take a COVID test on 10/19/2021 and was called today with a Positive Result. ? ?Pt requesting a call back about what her next steps will be. ? ?Please call the patient back. ?

## 2021-10-22 NOTE — Telephone Encounter (Signed)
Called Ms. Dimiceli - she is 5 days into symptoms, mostly head and nose congestion and diffuse bodyaches - no fevers to her knowledge and no problems with breathing  and no signifiant cough.  She is eating and drinking.  Voice sounded strong on the phone.  I advised Vick's Vaporub to help with nasal congestions.  We discussed Paxlovid - because there would be potential interactions with statin and with diltiazem, and because she is easily confused about med changes, and because we are 5 days into symptomatology, I suggested that we treat symptomatically and she agrees.  SHe will let us know if her symptoms worsen, particularly if she developed difficulty breathing. ? ?I spoke to her community health liaison by phone as well, who will reiterate these recommendations. ?

## 2021-10-23 ENCOUNTER — Other Ambulatory Visit (HOSPITAL_COMMUNITY): Payer: Self-pay | Admitting: *Deleted

## 2021-10-23 ENCOUNTER — Telehealth: Payer: Self-pay | Admitting: Gastroenterology

## 2021-10-23 MED ORDER — RIVAROXABAN 20 MG PO TABS: 20.0000 mg | ORAL_TABLET | Freq: Every day | ORAL | 11 refills | Status: DC

## 2021-10-23 NOTE — Telephone Encounter (Signed)
Inbound call from patient stated that she had something very important to tell Dr. Havery Moros but would not disclose any information. Please advise.  ?

## 2021-10-23 NOTE — Telephone Encounter (Signed)
Okay thanks for the update and letting me know ?

## 2021-10-23 NOTE — Telephone Encounter (Signed)
Returned call to patient. She wanted to let you know that the Miralax is working and she is feeling better. She did report that she was diagnosed with COVID and has to quarantine for 5 days. Pt was advised to continue recommendations provided to her regarding COVID. Pt will call if anything changes. She wanted to thank you. Pt had no other concerns at the end of the call. ?

## 2021-10-26 ENCOUNTER — Telehealth: Payer: Self-pay | Admitting: Internal Medicine

## 2021-10-26 NOTE — Telephone Encounter (Signed)
Pt requesting a call back about her two inhalers. ? ?Pt wanting to know whether or not take the following medications while she has COVID. ? ?Pt also reporting her throat is really hurting and sore and wants to know what else to do ?fluticasone (FLOVENT HFA) 44 MCG/ACT inhaler ? ?levalbuterol (XOPENEX HFA) 45 MCG/ACT inhaler ?

## 2021-10-26 NOTE — Telephone Encounter (Signed)
Patient called she would like to know if she still needs to take the Miralax said she has been going to bathroom a lot. ?

## 2021-10-26 NOTE — Telephone Encounter (Signed)
Refill Request ? ?Pt requesting  ? ?

## 2021-10-26 NOTE — Telephone Encounter (Signed)
Pt called back .. and I told her what her PCP message stated  ?

## 2021-10-26 NOTE — Telephone Encounter (Signed)
Returned call to patient. She reports that she has been taking 2 capfuls of Miralax BID. She reports that since she was diagnosed with COVID she has been having more bowel movements. I told pt that she can cut down on the Miralax dose. Pt states that she will try 1 capful or Miralax BID. I told her that if she is still having frequent bowel movements then she may want to hold the Miralax for a few days. Pt verbalized understanding and had no concerns at the end of the call. ?

## 2021-10-27 NOTE — Telephone Encounter (Signed)
Pt called in today with questions regarding her suppositories. Pt states that she has been using the prescribed suppositories BID, she stated that it was Anusol suppositories. She reports that the suppositories have caused her to have irritation on her rectum, I told her these were prescribed by her PCP. I told her to discontinue and try the Calmol 4 suppositories as needed. She is aware that these are available over the counter. She denies any constipation, straining, or excessive diarrhea. She states that her stools have been pretty soft. She has also been doing sitz baths, using wet wipes, and patting her rectum dry. Pt verbalized understanding of recommendations, she had no concerns at the end of the call. ?

## 2021-10-28 ENCOUNTER — Other Ambulatory Visit: Payer: Self-pay

## 2021-10-28 ENCOUNTER — Encounter (HOSPITAL_COMMUNITY): Payer: Self-pay

## 2021-10-28 ENCOUNTER — Emergency Department (HOSPITAL_COMMUNITY): Payer: Medicare Other

## 2021-10-28 ENCOUNTER — Emergency Department (HOSPITAL_COMMUNITY)
Admission: EM | Admit: 2021-10-28 | Discharge: 2021-10-28 | Disposition: A | Discharge status: Home / Self Care | Payer: Medicare Other | Attending: Emergency Medicine | Admitting: Emergency Medicine

## 2021-10-28 DIAGNOSIS — I10 Essential (primary) hypertension: Secondary | ICD-10-CM | POA: Insufficient documentation

## 2021-10-28 DIAGNOSIS — Z743 Need for continuous supervision: Secondary | ICD-10-CM | POA: Diagnosis not present

## 2021-10-28 DIAGNOSIS — G4489 Other headache syndrome: Secondary | ICD-10-CM | POA: Diagnosis not present

## 2021-10-28 DIAGNOSIS — U071 COVID-19: Secondary | ICD-10-CM | POA: Insufficient documentation

## 2021-10-28 DIAGNOSIS — Z87891 Personal history of nicotine dependence: Secondary | ICD-10-CM | POA: Insufficient documentation

## 2021-10-28 DIAGNOSIS — I700 Atherosclerosis of aorta: Secondary | ICD-10-CM | POA: Diagnosis not present

## 2021-10-28 DIAGNOSIS — D649 Anemia, unspecified: Secondary | ICD-10-CM | POA: Diagnosis not present

## 2021-10-28 DIAGNOSIS — Z7901 Long term (current) use of anticoagulants: Secondary | ICD-10-CM | POA: Diagnosis not present

## 2021-10-28 DIAGNOSIS — R079 Chest pain, unspecified: Secondary | ICD-10-CM

## 2021-10-28 DIAGNOSIS — R0789 Other chest pain: Secondary | ICD-10-CM | POA: Diagnosis not present

## 2021-10-28 DIAGNOSIS — R509 Fever, unspecified: Secondary | ICD-10-CM | POA: Diagnosis not present

## 2021-10-28 LAB — CBC WITH DIFFERENTIAL/PLATELET
Abs Immature Granulocytes: 0.02 10*3/uL (ref 0.00–0.07)
Basophils Absolute: 0 10*3/uL (ref 0.0–0.1)
Basophils Relative: 0 %
Eosinophils Absolute: 0.2 10*3/uL (ref 0.0–0.5)
Eosinophils Relative: 4 %
HCT: 29.8 % — ABNORMAL LOW (ref 36.0–46.0)
Hemoglobin: 9.4 g/dL — ABNORMAL LOW (ref 12.0–15.0)
Immature Granulocytes: 0 %
Lymphocytes Relative: 30 %
Lymphs Abs: 1.4 10*3/uL (ref 0.7–4.0)
MCH: 26.9 pg (ref 26.0–34.0)
MCHC: 31.5 g/dL (ref 30.0–36.0)
MCV: 85.1 fL (ref 80.0–100.0)
Monocytes Absolute: 0.5 10*3/uL (ref 0.1–1.0)
Monocytes Relative: 10 %
Neutro Abs: 2.6 10*3/uL (ref 1.7–7.7)
Neutrophils Relative %: 56 %
Platelets: 328 10*3/uL (ref 150–400)
RBC: 3.5 MIL/uL — ABNORMAL LOW (ref 3.87–5.11)
RDW: 18.5 % — ABNORMAL HIGH (ref 11.5–15.5)
WBC: 4.6 10*3/uL (ref 4.0–10.5)
nRBC: 0 % (ref 0.0–0.2)

## 2021-10-28 LAB — BASIC METABOLIC PANEL
Anion gap: 12 (ref 5–15)
BUN: 8 mg/dL (ref 8–23)
CO2: 20 mmol/L — ABNORMAL LOW (ref 22–32)
Calcium: 9.1 mg/dL (ref 8.9–10.3)
Chloride: 108 mmol/L (ref 98–111)
Creatinine, Ser: 0.86 mg/dL (ref 0.44–1.00)
GFR, Estimated: >60 mL/min (ref >60)
Glucose, Bld: 93 mg/dL (ref 70–99)
Potassium: 3.7 mmol/L (ref 3.5–5.1)
Sodium: 140 mmol/L (ref 135–145)

## 2021-10-28 LAB — TROPONIN I (HIGH SENSITIVITY)
Troponin I (High Sensitivity): 5 ng/L (ref <18)
Troponin I (High Sensitivity): 4 ng/L (ref <18)

## 2021-10-28 LAB — RESP PANEL BY RT-PCR (FLU A&B, COVID) ARPGX2
Influenza A by PCR: NEGATIVE
Influenza B by PCR: NEGATIVE
SARS Coronavirus 2 by RT PCR: POSITIVE — AB

## 2021-10-28 MED ORDER — HYDROCODONE-ACETAMINOPHEN 5-325 MG PO TABS
1.0000 | ORAL_TABLET | Freq: Once | ORAL | Status: AC
  Administered 2021-10-28: 1 via ORAL
  Filled 2021-10-28: qty 1

## 2021-10-28 NOTE — ED Notes (Signed)
Provided pt with saltine crackers and peanut butter per approval of EDP ?

## 2021-10-28 NOTE — ED Provider Notes (Signed)
[facility] EMERGENCY DEPARTMENT Provider Note   CSN: [id number] Arrival date & time: 10/28/21  0533     History  Chief Complaint  Patient presents with   Chest Pain    [name] is a 71 y.o. female.   Chest Pain Associated symptoms: cough   Associated symptoms: no fever    HPI: A 71 year old patient with a history of hypertension presents for evaluation of chest pain. Initial onset of pain was more than 6 hours ago. The patient's chest pain is sharp and is not worse with exertion. The patient's chest pain is middle- or left-sided, is not well-localized, is not described as heaviness/pressure/tightness and does not radiate to the arms/jaw/neck. The patient does not complain of nausea and denies diaphoresis. The patient has a family history of coronary artery disease in a first-degree relative with onset less than age 56. The patient has no history of stroke, has no history of peripheral artery disease, has not smoked in the past 90 days, denies any history of treated diabetes, has no history of hypercholesterolemia and does not have an elevated BMI (>=30).   Patient with history of hypertension, A-fib on anticoagulation presents with chest pain.  Patient reports she has had sharp chest pain for the past day.  No pleuritic type pain.  She denies any significant shortness of breath.  No fevers or vomiting.  She has had a mild nonproductive cough.  She is a former smoker.  Patient also mentions issues with constipation that she frequently has to take medications for.  She reports some persistent abdominal pain but no acute changes tonight. No previous history of CAD/VTE She reports recent bout of COVID-19 at the end of February.  She is concerned that she now has influenza Home Medications Prior to Admission medications   Medication Sig Start Date End Date Taking? Authorizing Provider  alendronate (FOSAMAX) 70 MG tablet Take 1 tablet (70 mg total) by mouth every 7  (seven) days. Take with a full glass of water on an empty stomach. 06/09/21 06/09/22  Angelica Pou, MD  atorvastatin (LIPITOR) 40 MG tablet Take 1 tablet (40 mg total) by mouth daily. 08/27/21 08/27/22  Angelica Pou, MD  cetirizine (ZYRTEC) 10 MG tablet Take 10 mg by mouth daily.    [provider]  cromolyn (NASALCROM) 5.2 MG/ACT nasal spray Place 1 spray into both nostrils 3 (three) times daily as needed for up to 7 days for rhinitis. 10/19/21 10/26/21  Demaio, Roxine Caddy, MD  diltiazem (CARDIZEM) 60 MG tablet Take 1 tablet (60 mg total) by mouth every 6 (six) hours as needed (afib). 08/14/21   Sherran Needs, NP  diltiazem (CARTIA XT) 120 MG 24 hr capsule Take 1 capsule (120 mg total) by mouth 2 (two) times daily. 09/18/21   Sherran Needs, NP  docusate sodium (COLACE) 100 MG capsule Take 1 capsule (100 mg total) by mouth daily. Patient taking differently: Take 100 mg by mouth daily as needed for mild constipation. 06/15/21   Nuala Alpha A, PA-C  ezetimibe (ZETIA) 10 MG tablet Take 1 tablet (10 mg total) by mouth daily. 10/30/20 10/30/21  Angelica Pou, MD  fluticasone (FLOVENT HFA) 44 MCG/ACT inhaler Inhale 1 puff into the lungs daily. 09/08/21   Angelica Pou, MD  hydrocortisone (ANUSOL-HC) 25 MG suppository Place 1 suppository (25 mg total) rectally 2 (two) times daily as needed (itching, stinging, and irritation of hemorrhoids). 10/16/21 11/15/21  Angelica Pou, MD  lactulose (  CHRONULAC) 10 GM/15ML solution Take 15 mLs (10 g total) by mouth daily as needed for mild constipation. Start by taking 1 dose daily.  If that does not produce a bowel movement you can increase to 2 times a day and if you still don't have a bowel movement you can increase to 3 times a day Patient not taking: Reported on 10/19/2021 10/09/21   Blanchie Dessert, MD  levalbuterol (XOPENEX HFA) 45 MCG/ACT inhaler INHALE 1 PUFF INTO THE LUNGS EVERY 6 HOURS AS NEEDED FOR SHORTNESS OF  BREATH Patient taking differently: Inhale 1 puff into the lungs every 6 (six) hours as needed for shortness of breath. 09/14/20   Riesa Pope, MD  linaclotide (LINZESS) 145 MCG CAPS capsule Take 1 capsule (145 mcg total) by mouth daily before breakfast. 10/19/21   Armbruster, Carlota Raspberry, MD  losartan (COZAAR) 50 MG tablet Take 2 tablets (100 mg total) by mouth daily. 08/27/21 01/09/22  Angelica Pou, MD  montelukast (SINGULAIR) 10 MG tablet Take 1 tablet (10 mg total) by mouth at bedtime. 09/19/20   Angelica Pou, MD  omeprazole (PRILOSEC) 40 MG capsule Take 1 capsule (40 mg total) by mouth in the morning and at bedtime. 09/29/21   Timothy Lasso, MD  ondansetron (ZOFRAN) 4 MG tablet Take 1 tablet (4 mg total) by mouth daily as needed for nausea or vomiting. 10/08/21 10/08/22  Angelica Pou, MD  polyethylene glycol (MIRALAX / GLYCOLAX) 17 g packet Take 17 g by mouth 2 (two) times daily. 09/29/21   Timothy Lasso, MD  potassium chloride (KLOR-CON M) 10 MEQ tablet Take 1 tablet (10 mEq total) by mouth daily. 10/19/21   Demaio, Alexa, MD  rivaroxaban (XARELTO) 20 MG TABS tablet Take 1 tablet (20 mg total) by mouth daily with supper. 10/23/21   Sherran Needs, NP  senna-docusate (SENOKOT-S) 8.6-50 MG tablet Take 1 tablet by mouth 2 (two) times daily. Patient not taking: Reported on 10/19/2021 09/29/21   Timothy Lasso, MD  spironolactone (ALDACTONE) 25 MG tablet Take 1 tablet (25 mg total) by mouth daily. 04/17/21 04/17/22  Angelica Pou, MD  sucralfate (CARAFATE) 1 g tablet Take 1 tablet (1 g total) by mouth 4 (four) times daily -  with meals and at bedtime. Patient not taking: Reported on 10/19/2021 10/09/21   Blanchie Dessert, MD  tiotropium (SPIRIVA) 18 MCG inhalation capsule Place 1 capsule (18 mcg total) into inhaler and inhale daily. 10/16/21   Angelica Pou, MD      Allergies    Albuterol, Percocet [oxycodone-acetaminophen], Celecoxib, and Tramadol    Review of  Systems   Review of Systems  Constitutional:  Negative for fever.  HENT:  Positive for sore throat.        Reports sore throat since previous endoscopy  Respiratory:  Positive for cough.   Cardiovascular:  Positive for chest pain.  Gastrointestinal:  Positive for constipation.  All other systems reviewed and are negative.  Physical Exam Updated Vital Signs BP 117/73 (BP Location: Right Arm)    Pulse 78    Temp 97.9 F (36.6 C) (Oral)    Resp (!) 23    SpO2 97%  Physical Exam CONSTITUTIONAL: Elderly, thin appearing HEAD: Normocephalic/atraumatic EYES: EOMI/PERRL ENMT: Mucous membranes moist NECK: supple no meningeal signs SPINE/BACK:entire spine nontender CV: S1/S2 noted, no murmurs/rubs/gallops noted LUNGS: Lungs are clear to auscultation bilaterally, no apparent distress Chest-no tenderness to palpation ABDOMEN: soft, nontender, no rebound or guarding, bowel sounds noted throughout abdomen GU:no cva  tenderness NEURO: Pt is awake/alert/appropriate, moves all extremitiesx4.  No facial droop.   EXTREMITIES: pulses normal/equal, full ROM, no LE edema or tenderness SKIN: warm, color normal PSYCH: no abnormalities of mood noted, alert and oriented to situation  ED Results / Procedures / Treatments   Labs (all labs ordered are listed, but only abnormal results are displayed) Labs Reviewed  CBC WITH DIFFERENTIAL/PLATELET - Abnormal; Notable for the following components:      Result Value   RBC 3.50 (*)    Hemoglobin 9.4 (*)    HCT 29.8 (*)    RDW 18.5 (*)    All other components within normal limits  RESP PANEL BY RT-PCR (FLU A&B, COVID) [id number]  BASIC METABOLIC PANEL  TROPONIN I (HIGH SENSITIVITY)    EKG ED ECG REPORT   Date: 10/28/2021 0539am  Rate: 78  Rhythm: normal sinus rhythm  QRS Axis: normal  Intervals: normal  ST/T Wave abnormalities: normal  Conduction Disutrbances:none  Narrative Interpretation:   Old EKG Reviewed: unchanged  I have personally reviewed  the EKG tracing and agree with the computerized printout as noted.   Radiology DG Chest Port 1 View  Result Date: 10/28/2021 CLINICAL DATA:  71 year old female with history of chest pain. EXAM: PORTABLE CHEST 1 VIEW COMPARISON:  Chest x-ray 08/29/2021. FINDINGS: Lung volumes are normal. No consolidative airspace disease. No pleural effusions. No pneumothorax. No pulmonary nodule or mass noted. Pulmonary vasculature and the cardiomediastinal silhouette are within normal limits. Atherosclerosis in the thoracic aorta. IMPRESSION: 1.  No radiographic evidence of acute cardiopulmonary disease. 2. Aortic atherosclerosis. Electronically Signed   By: Vinnie Langton M.D.   On: 10/28/2021 06:41    Procedures Procedures    Medications Ordered in ED Medications  HYDROcodone-acetaminophen (NORCO/VICODIN) 5-325 MG per tablet 1 tablet (has no administration in time range)    ED Course/ Medical Decision Making/ A&P Clinical Course as of 10/28/21 0656  Wed Oct 28, 2021  0624 Stable anemia [DW]  0645 Patient with known history of A-fib and is on anticoagulation.  Patient sustained a splenic injury last month during colonoscopy but never required surgery and is stabilized since that time.  She now reports sharp chest pain that is not worse with breathing.  Overall she is in no acute distress.  Initial work-up is pending at this time [DW]  [phone] Signed out to dr Tyrone Nine at shift change.  Anticipate discharge if all testing negative [DW]    Clinical Course User Index [DW] Ripley Fraise, MD   HEAR Score: 3                       Medical Decision Making Amount and/or Complexity of Data Reviewed Labs: ordered. Radiology: ordered.  Risk Prescription drug management.   This patient presents to the ED for concern of chest pain, this involves an extensive number of treatment options, and is a complaint that carries with it a high risk of complications and morbidity.  The differential diagnosis includes  acute coronary syndrome, pneumonia, aortic dissection, pulmonary embolism  Comorbidities that complicate the patient evaluation: Patients presentation is complicated by their history of hypertension  Social Determinants of Health: Patient is on disability  Additional history obtained:  Records reviewed previous admission documents  Lab Tests: I Ordered, and personally interpreted labs.  The pertinent results include: Mild anemia  Imaging Studies ordered: I ordered imaging studies including X-ray chest   I independently visualized and interpreted imaging which showed no acute findings I  agree with the radiologist interpretation  Cardiac Monitoring: The patient was maintained on a cardiac monitor.  I personally viewed and interpreted the cardiac monitor which showed an underlying rhythm of:  sinus rhythm  Medicines ordered and prescription drug management: I ordered medication including vicodin  for pain     Test Considered: Patient is low risk / negative by HEAR score, therefore do not feel that admission is indicated if troponins are ngative Low suspicion for PE given no hypoxia/tachycardia   Reevaluation: After the interventions noted above, I reevaluated the patient and found that they have :stayed the same  Complexity of problems addressed: Patients presentation is most consistent with  acute presentation with potential threat to life or bodily function           Final Clinical Impression(s) / ED Diagnoses Final diagnoses:  None    Rx / DC Orders ED Discharge Orders     None         Ripley Fraise, MD 10/28/21 0700

## 2021-10-28 NOTE — ED Provider Notes (Signed)
I received the patient in signout from Dr. Christy Gentles, briefly the patient is a 71 year old female with a chief complaint of chest discomfort.  Sounds atypical by history plan for 2 troponins if negative likely discharge home. ? ?Patient's work-up has resulted without significant anemia no significant electrolyte abnormality she is known to have COVID and her test is positive.  She has 2 troponins that are negative.  We will discharge the patient home.  PCP follow-up. ?  [name], DO ?10/28/21 1031 ? ?

## 2021-10-28 NOTE — Discharge Instructions (Signed)
Your lab work was reassuring that this is not a heart attack.  Please follow-up with your family doctor in the office.  Please return for worsening symptoms. ?

## 2021-10-28 NOTE — ED Triage Notes (Signed)
Pt BIB [facility] from home c/o central sharp CP and headache, pt stated she took Tylenol and it "helped relieve a little bit". Pt tested Covid+ feb. 27th.  ? ?VS ?134/74 ?85 ?16 ?100% ?

## 2021-10-28 NOTE — ED Notes (Signed)
Pt states her chest no longer hurts but her head hurts really bad. Check MAR ?

## 2021-10-29 ENCOUNTER — Telehealth: Payer: Self-pay | Admitting: Gastroenterology

## 2021-10-29 NOTE — Telephone Encounter (Signed)
Attempted to call the [facility] at [place]. On hold for over 20 minutes with 2 people ahead. ?

## 2021-10-29 NOTE — Telephone Encounter (Signed)
Lm on vm for patient to return call 

## 2021-10-29 NOTE — Telephone Encounter (Signed)
Patient called and stated  that she has went to [facility] and [facility] to get Anusol suppositories and they did not have any. I advised patient to check [facility] and she stated she had already been there as well. Patient is seeking advice if there is anywhere else she could get them. Please advise.  ?

## 2021-10-29 NOTE — Telephone Encounter (Signed)
Tried to contact patient. Left voicemail to return call. [name] was originally trying to contact patient (see other message started on 3/3) ?

## 2021-10-29 NOTE — Telephone Encounter (Signed)
Patient returned your call. Please call her back.

## 2021-10-29 NOTE — Telephone Encounter (Signed)
Patient called stating she tried to find the suppositories OTC at [facility].  The pharmacist told her what she needed was by prescription and that we would have to call them in for her.  She is to take the Anusol #4.  She uses the [facility] on [address].  If there is an issue, please call patient and advise.  Thank you. ?

## 2021-10-29 NOTE — Telephone Encounter (Signed)
See alternate telephone encounter from today.

## 2021-10-30 ENCOUNTER — Telehealth: Payer: Self-pay

## 2021-10-30 ENCOUNTER — Telehealth: Payer: Self-pay | Admitting: *Deleted

## 2021-10-30 DIAGNOSIS — A048 Other specified bacterial intestinal infections: Secondary | ICD-10-CM

## 2021-10-30 MED ORDER — PYLERA 140-125-125 MG PO CAPS: 3.0000 | ORAL_CAPSULE | Freq: Three times a day (TID) | ORAL | 0 refills | Status: DC

## 2021-10-30 NOTE — Telephone Encounter (Signed)
Called and spoke with patient regarding her questions about the suppositories. She states that [facility] told her they had the Calmol 4 suppositories but it required a prescription. I told pt that the easiest thing for her to do is have someone order the Calmol 4 suppositories and they will be shipped directly to her home. Pt states that she would ask her daughter to order for her. I told her that if she orders today she should have them delivered by the end of the weekend.  ? ?We discussed the need for H. Pylori treatment, pt states that she is ready if that is what Dr. Havery Moros is recommending . I told pt that we will send the prescription for Pylera to [place] and they will work on getting this approved by her insurance. She knows that if approved [facility] will contact her directly to set up shipment of Pylera. Pt knows that if it is denied we will be in contact with further treatment recommendations. Patient has been advised to continue Omeprazole 40 gm BID during treatment. She knows that I will call her to remind her about stool study 1 month after she completes treatment and I will call to remind her to hold PPI 2 weeks prior to her stool study. Pt advised to avoid alcohol during treatment. Patient wrote down recommended suppositories again, she wrote down the name of Pylera and [facility] pharmacy's recommendations. Pt repeated back recommendations. She verbalized understanding of all information and had no concerns at the end of the call. ? ?Lab order and reminder in epic.  ? ?Pylera RX sent to [facility] along with pt records, demographic and insurance information.  ?

## 2021-10-30 NOTE — Telephone Encounter (Addendum)
-----   Message ----- ?From: Yetta Flock, MD ?Sent: 10/30/2021   7:41 AM EST ?To: Roetta Sessions, CMA ?Subject: RE: treat for H pylori                        ? ?Thanks Jan. Can you reach out to her to see if she is ready to treat for H pylori. Would recommend Pylera for 10 day course along with omeprazole '40mg'$  BID.  ? ?Can you please order this if no signfiicant interactions noted. Once done with therapy, the patient should wait one month and perform a stool study for H pylori antigen to ensure negative. The PPI needs to be held at least 2 weeks prior to submitting the stool sample. The patient should avoid alcohol while taking this. ? ?Thanks ? ?----- Message ----- ?From: Roetta Sessions, CMA ?Sent: 10/30/2021  12:00 AM EST ?To: Yetta Flock, MD ?Subject: treat for H pylori                            ? ?You asked me to remind you in two weeks to treat patient for H pylori ? ? ? ? ? ?

## 2021-10-30 NOTE — Telephone Encounter (Signed)
Call from pt stating her BP's keep going up and down. It was 80/56; stated she took Losartan '50mg'$  and Spironolactone '25mg'$  this am. Denies feeling dizzy or lightheadedness. While on the phone, she took her BP and it was 110/55 HR- 81.Informed pt I will let her doctor know. ?

## 2021-10-30 NOTE — Telephone Encounter (Signed)
Call to patient informed her to stop taking the Spirolactone for a few days per Dr. Jimmye Norman and to only take if the top number of her blood pressure is above 150.  Patient voiced understanding of and will call if she has problems.   ?

## 2021-11-04 ENCOUNTER — Other Ambulatory Visit: Payer: Self-pay

## 2021-11-04 MED ORDER — POTASSIUM CHLORIDE CRYS ER 10 MEQ PO TBCR: 10.0000 meq | EXTENDED_RELEASE_TABLET | Freq: Every day | ORAL | 0 refills | Status: DC

## 2021-11-04 MED ORDER — TALICIA 250-12.5-10 MG PO CPDR: 3.0000 | DELAYED_RELEASE_CAPSULE | Freq: Four times a day (QID) | ORAL | 0 refills | Status: AC

## 2021-11-04 NOTE — Addendum Note (Signed)
Addended by: Yevette Edwards on: 11/04/2021 03:24 PM ? ? Modules accepted: Orders ? ?

## 2021-11-04 NOTE — Telephone Encounter (Signed)
Patient called stating [facility] called her yesterday and before they can send her medicine they will need to speak to our office.  The number for [facility] is [phone].  After speaking with the pharmacy, please call patient and advise.  Thank you. ?

## 2021-11-04 NOTE — Telephone Encounter (Signed)
I chose Pylera to make it easier for her as I think she gets confused with medication and think she may have a hard time complying with the antibiotics. Do you know if we have any free samples of Pylera or Talicia in the office? If not, will need to try the regular antibiotics course but she will need a lot of counseling about it. Thanks ?

## 2021-11-04 NOTE — Telephone Encounter (Signed)
PA for Pylera was denied. I placed the papers in your IN box for review. We will need to send in components listed in denial letter or you may write a letter as to why the patient can't take other antibiotics. Thanks ?

## 2021-11-04 NOTE — Telephone Encounter (Addendum)
Sending in [place] to see if this will be covered since Pylera was denied. Franklyn Lor sent to [place] along with patient's records. If denied will speak to rep about samples. ? ?I called pt and informed her that we have sent in a new prescription to [facility]. She knows that if insurance denies Talicia then we will see if we can get her a sample. Pt verbalized understanding and had no concerns at the end of the call. ?

## 2021-11-09 NOTE — Telephone Encounter (Signed)
Patient previously spoke with Jan, CMA about this issues. She has not returned phone call. ?

## 2021-11-09 NOTE — Telephone Encounter (Signed)
Pt called to follow up on Talicia RX. I informed pt that we are still waiting to hear from [facility], they are still completing the benefits investigation. I told pt that we will call her back once we have an update. ?

## 2021-11-11 NOTE — Telephone Encounter (Signed)
Returned call to patient. Pt wanted to know if she should take Lactulose and Miralax. I told her that if she is not having any constipation, straining, and having consistent stools she did not need to add back Lactulose. Pt stated that the Lactulose seemed to work a little faster than the Miralax. I told her that we don't want her to have diarrhea so if she is having consistent stools on the Miralax she should continue her BID dosing, she knows that she can take 1 additional dose of Miralax PRN and to drink plenty of water. Pt wanted to know if there was an update on [facility]. I told her I checked on RX today and had not seen any update from [facility]. I told her we will try to see if we can get her a sample. Pt knows that I will be in contact soon. Pt verbalized understanding. ?

## 2021-11-11 NOTE — Telephone Encounter (Signed)
Patient called seeking advice if she is not taking Miralax if she can start taking Talicia again. Please advise.  ?

## 2021-11-16 ENCOUNTER — Telehealth: Payer: Self-pay | Admitting: Internal Medicine

## 2021-11-16 NOTE — Telephone Encounter (Signed)
RTC to patient given instructions that a Heating Pad and Tylenol are the best options for pain relief and to contact her GI doctor.  Patient agreed and then later asked for something for pain.  Patient was again told that the best option until a reason for the pain can be determined would be Tylenol and heat.  Patient once again asked if Dr. Jimmye Norman would give her a couple of pain pills for now.  Patient said that she has contacted her GI doctor, but will call again about the pain. ?

## 2021-11-16 NOTE — Telephone Encounter (Signed)
Pt requesting a call back.  Pt states she is having a lot of pain in her pain and wants to know if she could have some "Pain Medication" called in. ? ? ? ?

## 2021-11-16 NOTE — Telephone Encounter (Signed)
Patient calling back about her medication. ?

## 2021-11-16 NOTE — Telephone Encounter (Signed)
Case #: [id number]  Current Copay: $0  Effective Date: 11/16/2021  Expiration Date: 28/41/3244 for Talicia. [facility] has attempted to reach pt to set up shipment. I called the pt and told her that Pat Kocher was approved and she will need [facility] so that they can mail her the medication. I provided pt with the phone number to [facility]. Pt verbalized understanding and had no concerns at the end of the call. ? ?Lab reminder in epic ?

## 2021-11-16 NOTE — Telephone Encounter (Signed)
RTC to patient requesting pain medication for her abdominal pain.  Stated that her GI doctor is trying to get her an Antibiotic for the infection in her abdomen.  Has had to change to another med to see if her insurance will cover. States abdomen is warm due to the infection.  Would like something for pain in her abdomen while she awaits approval of the antibiotic. ?

## 2021-11-17 NOTE — Telephone Encounter (Signed)
Fax from [facility] indicates Pat Kocher has been shipped to patient on 11-16-21. ?

## 2021-11-19 ENCOUNTER — Telehealth: Payer: Self-pay

## 2021-11-19 ENCOUNTER — Ambulatory Visit (HOSPITAL_COMMUNITY)
Admission: RE | Admit: 2021-11-19 | Discharge: 2021-11-19 | Disposition: A | Discharge status: Home / Self Care | Payer: Medicare Other | Source: Ambulatory Visit | Attending: Nurse Practitioner | Admitting: Nurse Practitioner

## 2021-11-19 VITALS — BP 118/60 | HR 75 | Ht 67.0 in | Wt 135.6 lb

## 2021-11-19 DIAGNOSIS — J4520 Mild intermittent asthma, uncomplicated: Secondary | ICD-10-CM | POA: Diagnosis not present

## 2021-11-19 DIAGNOSIS — I493 Ventricular premature depolarization: Secondary | ICD-10-CM | POA: Diagnosis not present

## 2021-11-19 DIAGNOSIS — I480 Paroxysmal atrial fibrillation: Secondary | ICD-10-CM | POA: Diagnosis not present

## 2021-11-19 DIAGNOSIS — Z8616 Personal history of COVID-19: Secondary | ICD-10-CM | POA: Insufficient documentation

## 2021-11-19 DIAGNOSIS — Z7901 Long term (current) use of anticoagulants: Secondary | ICD-10-CM | POA: Insufficient documentation

## 2021-11-19 DIAGNOSIS — Z79899 Other long term (current) drug therapy: Secondary | ICD-10-CM | POA: Insufficient documentation

## 2021-11-19 DIAGNOSIS — B9681 Helicobacter pylori [H. pylori] as the cause of diseases classified elsewhere: Secondary | ICD-10-CM | POA: Diagnosis not present

## 2021-11-19 DIAGNOSIS — Z8249 Family history of ischemic heart disease and other diseases of the circulatory system: Secondary | ICD-10-CM | POA: Diagnosis not present

## 2021-11-19 DIAGNOSIS — D6869 Other thrombophilia: Secondary | ICD-10-CM | POA: Diagnosis not present

## 2021-11-19 NOTE — Telephone Encounter (Signed)
RTC to patient c/o lag and back pain.  States doctor is aware of her Arthritis pain  in her back and legs.  Pain at a level 9. Patient staed that she got the medication for her stomach.  Wants prescription for meds called to [facility]. ?

## 2021-11-19 NOTE — Telephone Encounter (Signed)
Pt   is requesting a call back ... she stated that her arm and legs hurting her  ?

## 2021-11-19 NOTE — Progress Notes (Addendum)
? ?Primary Care Physician: Angelica Pou, MD ?Referring Physician:ER f/u ?EP: Dr. Rayann Heman ? ? ?[name] is a 71 y.o. female with a h/o paroxysmal afib, s/p ablation, 11/2015, asthma seasonal allergies, that is in the afib clinic for f/u.  Currently she is not having any problems, no afib awareness and is doing well staying in SR. She had a recent colonoscopy with some bleeding post procedure. It sounds that she started a new med for H. Pylori today as she has had  a lot of stomach pain/issues recently. She had covid around one month ago.  ?  ?Today, she denies symptoms of palpitations, chest pain, shortness of breath, orthopnea, PND, lower extremity edema, dizziness, presyncope, syncope, or neurologic sequela. The patient is tolerating medications without difficulties and is otherwise without complaint today.  ? ?Past Medical History:  ?Diagnosis Date  ? Asthma in adult, mild intermittent, uncomplicated   ? Chronic anticoagulation   ? Chronic constipation   ? Eczema   ? GERD (gastroesophageal reflux disease)   ? History of kidney stones   ? Hx of vaginal bleeding, resolved, ultrasound completed 10/11/2018  ? Hypertension   ? Personal history of atrial fibrillation, s/p ablation, now in NSR but remains on anticoaulation 11/26/2015  ? Followed in cardiology clinic   ? ?Past Surgical History:  ?Procedure Laterality Date  ? ATRIAL FIBRILLATION ABLATION N/A 09/30/2017  ? Procedure: ATRIAL FIBRILLATION ABLATION;  Surgeon: Thompson Grayer, MD;  Location: [facility];  Service: Cardiovascular;  Laterality: N/A;  ? BIOPSY  09/24/2021  ? Procedure: BIOPSY;  Surgeon: Sharyn Creamer, MD;  Location: [facility];  Service: Gastroenterology;;  ? CHOLECYSTECTOMY N/A 12/08/2018  ? Procedure: LAPAROSCOPIC CHOLECYSTECTOMY WITH INTRAOPERATIVE CHOLANGIOGRAM;  Surgeon: Jovita Kussmaul, MD;  Location: [facility];  Service: General;  Laterality: N/A;  ? COLONOSCOPY WITH PROPOFOL N/A 09/24/2021  ? Procedure: COLONOSCOPY WITH PROPOFOL;   Surgeon: Sharyn Creamer, MD;  Location: [facility];  Service: Gastroenterology;  Laterality: N/A;  ? ERCP N/A 12/09/2018  ? Procedure: ENDOSCOPIC RETROGRADE CHOLANGIOPANCREATOGRAPHY (ERCP);  Surgeon: Carol Ada, MD;  Location: [facility];  Service: Endoscopy;  Laterality: N/A;  ? ESOPHAGOGASTRODUODENOSCOPY (EGD) WITH PROPOFOL N/A 09/24/2021  ? Procedure: ESOPHAGOGASTRODUODENOSCOPY (EGD) WITH PROPOFOL;  Surgeon: Sharyn Creamer, MD;  Location: [facility];  Service: Gastroenterology;  Laterality: N/A;  ? POLYPECTOMY  09/24/2021  ? Procedure: POLYPECTOMY;  Surgeon: Sharyn Creamer, MD;  Location: [facility];  Service: Gastroenterology;;  ? SPHINCTEROTOMY  12/09/2018  ? Procedure: SPHINCTEROTOMY;  Surgeon: Carol Ada, MD;  Location: [facility];  Service: Endoscopy;;  ? TONSILLECTOMY    ? TUBAL LIGATION    ? ? ?Current Outpatient Medications  ?Medication Sig Dispense Refill  ? alendronate (FOSAMAX) 70 MG tablet Take 1 tablet (70 mg total) by mouth every 7 (seven) days. Take with a full glass of water on an empty stomach. 12 tablet 3  ? Amoxicill-Rifabutin-Omeprazole (TALICIA PO) Take 4 tablets by mouth every 8 (eight) hours.    ? atorvastatin (LIPITOR) 40 MG tablet Take 1 tablet (40 mg total) by mouth daily. 90 tablet 3  ? cetirizine (ZYRTEC) 10 MG tablet Take 10 mg by mouth daily.    ? diltiazem (CARDIZEM) 60 MG tablet Take 1 tablet (60 mg total) by mouth every 6 (six) hours as needed (afib). 45 tablet 1  ? diltiazem (CARTIA XT) 120 MG 24 hr capsule Take 1 capsule (120 mg total) by mouth 2 (two) times daily. 180 capsule 2  ?  docusate sodium (COLACE) 100 MG capsule Take 1 capsule (100 mg total) by mouth daily. (Patient taking differently: Take 100 mg by mouth daily as needed for mild constipation.) 14 capsule 0  ? fluticasone (FLOVENT HFA) 44 MCG/ACT inhaler Inhale 1 puff into the lungs daily. 30.8 g 1  ? levalbuterol (XOPENEX HFA) 45 MCG/ACT inhaler INHALE 1 PUFF INTO THE LUNGS EVERY 6 HOURS AS NEEDED FOR  SHORTNESS OF BREATH (Patient taking differently: Inhale 1 puff into the lungs every 6 (six) hours as needed for shortness of breath.) 45 g 1  ? losartan (COZAAR) 50 MG tablet Take 2 tablets (100 mg total) by mouth daily. 135 tablet 3  ? montelukast (SINGULAIR) 10 MG tablet Take 1 tablet (10 mg total) by mouth at bedtime. 90 tablet 3  ? omeprazole (PRILOSEC) 40 MG capsule Take 1 capsule (40 mg total) by mouth in the morning and at bedtime. 90 capsule 1  ? polyethylene glycol (MIRALAX / GLYCOLAX) 17 g packet Take 17 g by mouth 2 (two) times daily. 14 each 0  ? potassium chloride (KLOR-CON M) 10 MEQ tablet Take 1 tablet (10 mEq total) by mouth daily. 60 tablet 0  ? rivaroxaban (XARELTO) 20 MG TABS tablet Take 1 tablet (20 mg total) by mouth daily with supper. 30 tablet 11  ? tiotropium (SPIRIVA) 18 MCG inhalation capsule Place 1 capsule (18 mcg total) into inhaler and inhale daily. 90 capsule 3  ? ezetimibe (ZETIA) 10 MG tablet Take 1 tablet (10 mg total) by mouth daily. 30 tablet 11  ? ?No current facility-administered medications for this encounter.  ? ? ?Allergies  ?Allergen Reactions  ? Albuterol Palpitations  ?  Caused afib   ? Percocet [Oxycodone-Acetaminophen] Shortness Of Breath, Itching and Anxiety  ?  Makes jittery, difficulty breathing, itching  ? Celecoxib Rash  ? Tramadol Palpitations  ?  Ok to trial again 09/29/2021 and f/u HR  ? ? ?Social History  ? ?Socioeconomic History  ? Marital status: Single  ?  Spouse name: Not on file  ? Number of children: Not on file  ? Years of education: Not on file  ? Highest education level: Not on file  ?Occupational History  ? Occupation: Retired  ?Tobacco Use  ? Smoking status: Former  ?  Types: Cigarettes  ?  Quit date: 10/24/2015  ?  Years since quitting: 6.0  ? Smokeless tobacco: Never  ?Vaping Use  ? Vaping Use: Never used  ?Substance and Sexual Activity  ? Alcohol use: Not Currently  ? Drug use: Not Currently  ?  Types: Marijuana  ?  Comment: Smoked marijuana in the  past.  ? Sexual activity: Yes  ?  Partners: Male  ?  Birth control/protection: Post-menopausal  ?Other Topics Concern  ? Not on file  ?Social History Narrative  ? Lives alone in [place], [place] and has 2 daughters who are within driving distance  ? Enjoys watching soap operas, reading, exercise  ? ?Social Determinants of Health  ? ?Financial Resource Strain: Not on file  ?Food Insecurity: Not on file  ?Transportation Needs: Not on file  ?Physical Activity: Not on file  ?Stress: Not on file  ?Social Connections: Not on file  ?Intimate Partner Violence: Not on file  ? ? ?Family History  ?Problem Relation Age of Onset  ? Heart attack Mother   ?     Young age  ? Breast cancer Sister   ?     Late 49s; now s/p mastectomy   ?  Lung cancer Brother   ?     x 2  ? Cancer Sister   ? Cancer Sister   ? Cancer Sister   ? Colon cancer Neg Hx   ? Colon polyps Neg Hx   ? Esophageal cancer Neg Hx   ? Rectal cancer Neg Hx   ? Stomach cancer Neg Hx   ? ? ?ROS- All systems are reviewed and negative except as per the HPI above ? ?Physical Exam: ?Vitals:  ? 11/19/21 1010  ?BP: 118/60  ?Pulse: 75  ?Weight: 61.5 kg  ?Height: '5\' 7"'$  (1.702 m)  ? ?Wt Readings from Last 3 Encounters:  ?11/19/21 61.5 kg  ?10/19/21 59.7 kg  ?10/15/21 59.6 kg  ? ? ?Labs: ?Lab Results  ?Component Value Date  ? NA 140 10/28/2021  ? K 3.7 10/28/2021  ? CL 108 10/28/2021  ? CO2 20 (L) 10/28/2021  ? GLUCOSE 93 10/28/2021  ? BUN 8 10/28/2021  ? CREATININE 0.86 10/28/2021  ? CALCIUM 9.1 10/28/2021  ? PHOS 3.0 04/15/2017  ? MG 1.9 08/29/2021  ? ?Lab Results  ?Component Value Date  ? INR 2.4 (H) 10/09/2021  ? ?Lab Results  ?Component Value Date  ? CHOL 227 (H) 04/16/2021  ? HDL 62 04/16/2021  ? LDLCALC 140 (H) 04/16/2021  ? TRIG 143 04/16/2021  ? ? ? ?GEN- The patient is well appearing, alert and oriented x 3 today.   ?Head- normocephalic, atraumatic ?Eyes-  Sclera clear, conjunctiva pink ?Ears- hearing intact ?Oropharynx- clear ?Neck- supple, no JVP ?Lymph- no cervical  lymphadenopathy ?Lungs- Clear to ausculation bilaterally, normal work of breathing ?Heart- Regular rate and rhythm, no murmurs, rubs or gallops, PMI not laterally displaced ?GI- soft, NT, ND, + BS ?Extremities- no

## 2021-11-20 ENCOUNTER — Other Ambulatory Visit: Payer: Self-pay | Admitting: Internal Medicine

## 2021-11-20 DIAGNOSIS — M545 Low back pain, unspecified: Secondary | ICD-10-CM

## 2021-11-20 DIAGNOSIS — M159 Polyosteoarthritis, unspecified: Secondary | ICD-10-CM

## 2021-11-20 MED ORDER — HYDROCODONE-ACETAMINOPHEN 5-325 MG PO TABS: 1.0000 | ORAL_TABLET | Freq: Every day | ORAL | 0 refills | Status: DC | PRN

## 2021-11-20 NOTE — Telephone Encounter (Signed)
RTC to patient message left to  inform patient  that Dr. Jimmye Norman has sent a prescription for 10 pills for her pain .  She should take 1 time  a day only. Do not want to cause her constipation to return if she takes to often.   ?

## 2021-11-20 NOTE — Telephone Encounter (Signed)
Pt called again his morning stating no one called her back yesterday I explained that the RN  sent the message to the PCP and has not gotten a reply as of yet that is why no one called her back yet  ?

## 2021-11-25 ENCOUNTER — Encounter: Payer: Medicare Other | Admitting: Internal Medicine

## 2021-11-26 ENCOUNTER — Encounter: Payer: Self-pay | Admitting: Internal Medicine

## 2021-11-27 IMAGING — CT CT RENAL STONE PROTOCOL
2 of 4 series · 16 of 46 positions shown, 18 images · non-contrast
Comparison: CT abdomen and pelvis from November 30, 2019.

CLINICAL DATA: Flank pain, kidney stone suspected.

EXAM:
CT ABDOMEN AND PELVIS WITHOUT CONTRAST
TECHNIQUE: Multidetector CT imaging of the abdomen and pelvis was performed
following the standard protocol without IV contrast.

[Series 3: renal stone 5.0 · axial · 0.98mm/px · z∈[+793,+1178]mm · 13 of 85 slices shown, 15 images]
[im 4/85  soft-tissue]
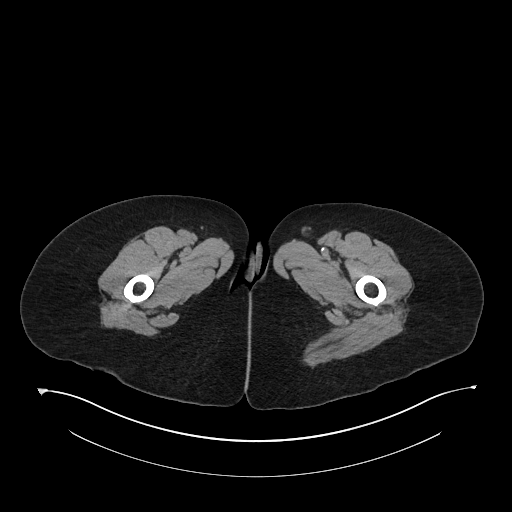
[im 4/85  bone]
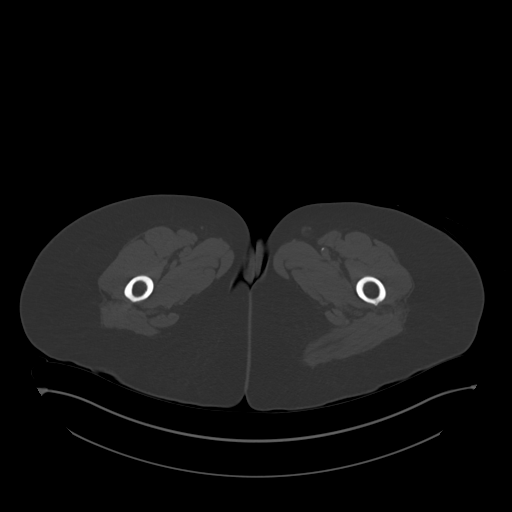
[im 11/85  soft-tissue]
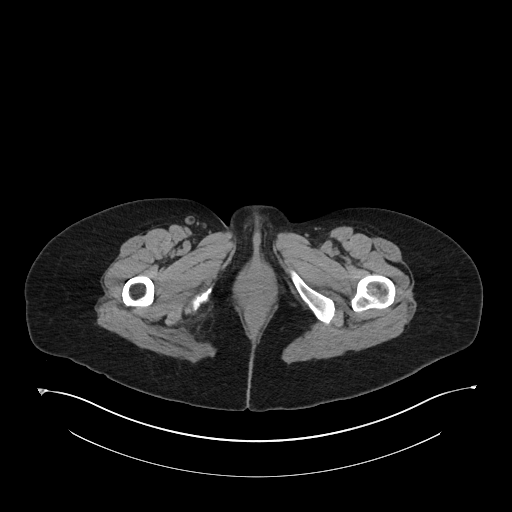
[im 19/85  soft-tissue]
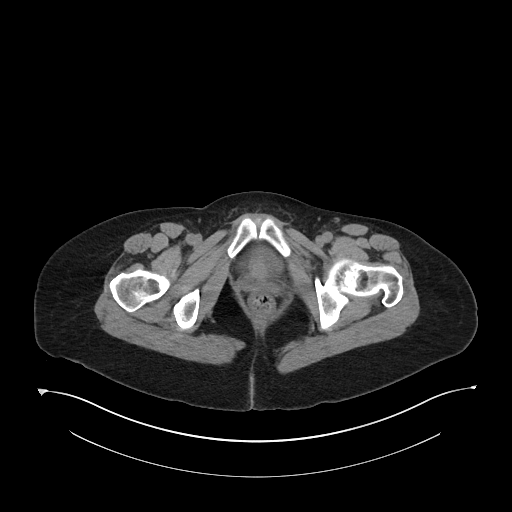
[im 22/85  soft-tissue]
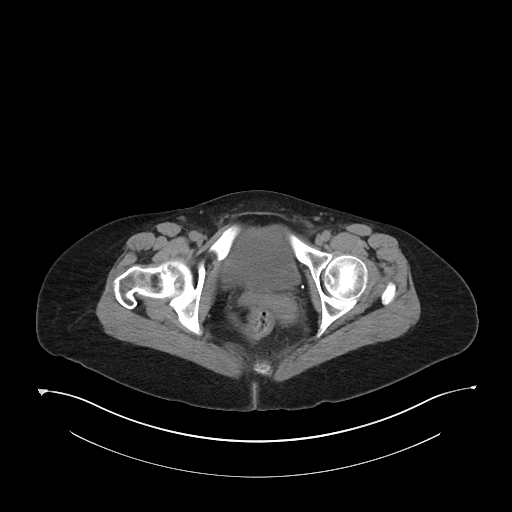
[im 30/85  soft-tissue]
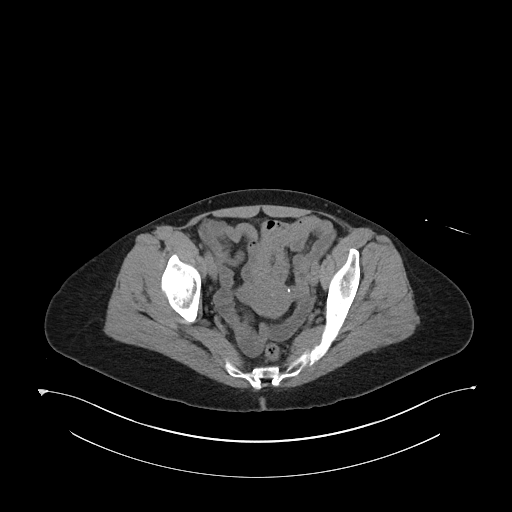
[im 37/85  soft-tissue]
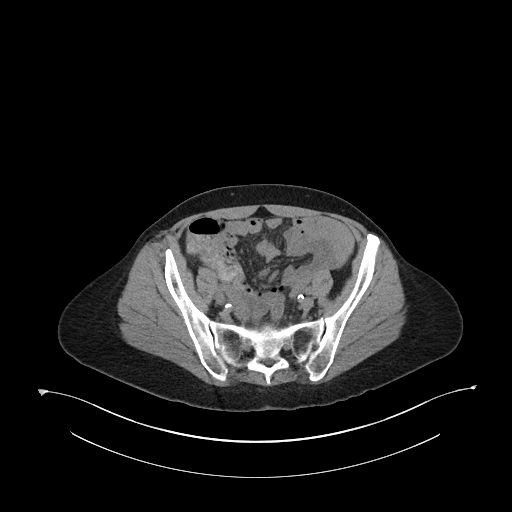
[im 44/85  soft-tissue]
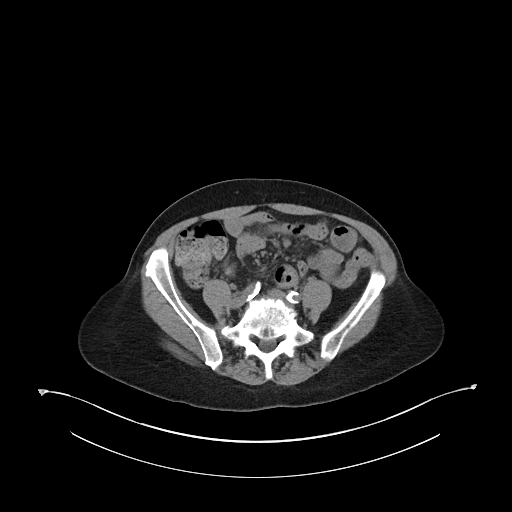
[im 48/85  soft-tissue]
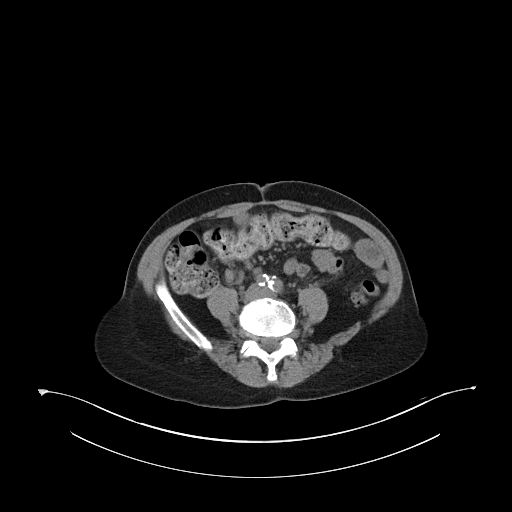
[im 55/85  soft-tissue]
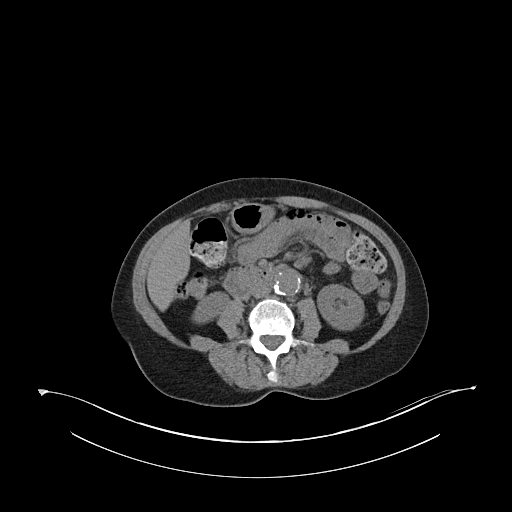
[im 55/85  bone]
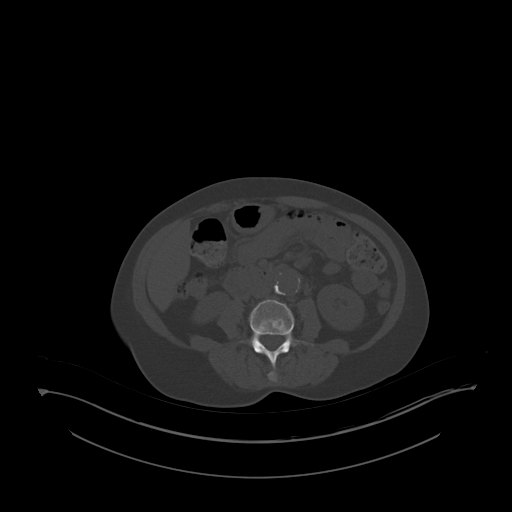
[im 63/85  soft-tissue]
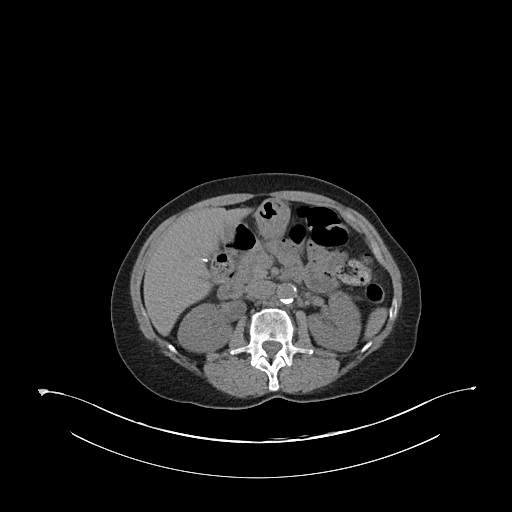
[im 66/85  soft-tissue]
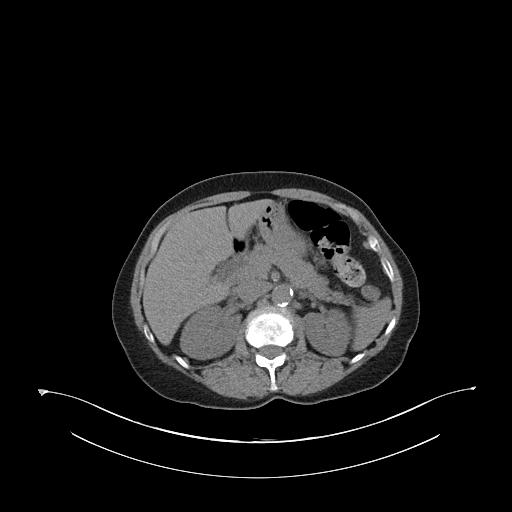
[im 74/85  soft-tissue]
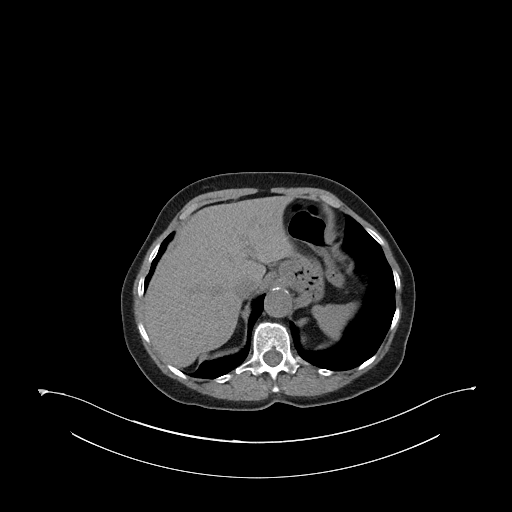
[im 81/85  soft-tissue]
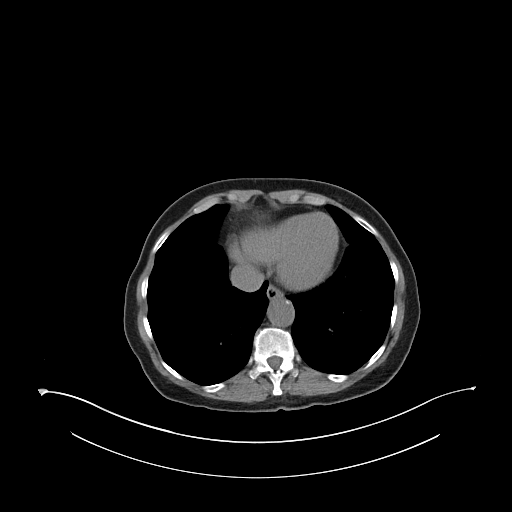

[Series 6: coronal · coronal · 0.80mm/px · 3 of 94 slices shown]
[im 32/94  soft-tissue]
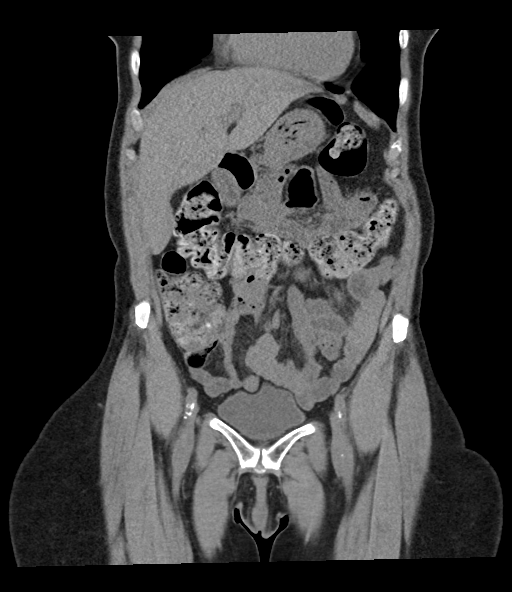
[im 42/94  soft-tissue]
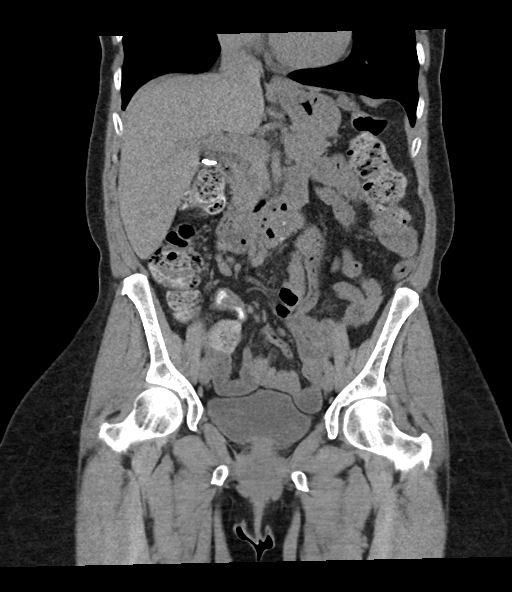
[im 52/94  soft-tissue]
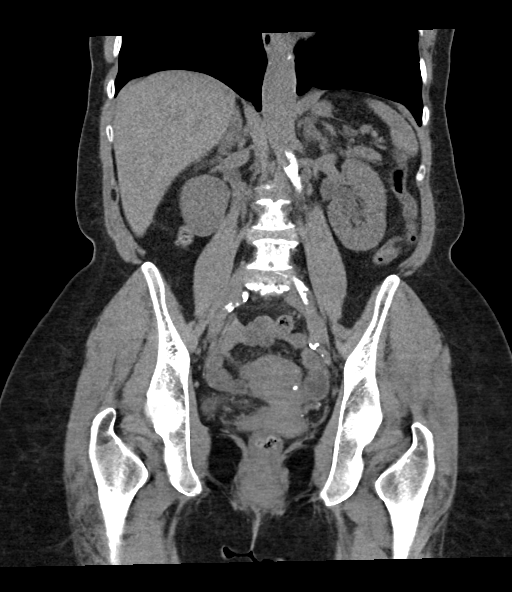

[16 of 46 positions shown; findings below may reference images not displayed]

FINDINGS: Lower chest: No acute abnormality. There appears to be a nodule in
the right lower lobe, but is incompletely imaged. Please see CT of
the chest from 05/29/2017 for further characterization and follow-up
recommendations.

Hepatobiliary: Similar appearance of multiple subcentimeter hepatic
cysts. Cholecystectomy.

Pancreas: Unremarkable. No pancreatic ductal dilatation or
surrounding inflammatory changes.

Spleen: Normal in size without focal abnormality.

Adrenals/Urinary Tract: Adrenal glands are unremarkable. Similar
punctate right renal calculus. No hydronephrosis. No evidence of a
ureteral calculus. Bladder is unremarkable.

Stomach/Bowel: Stomach is within normal limits. Appendix is not
confidently visualized but there is no right lower quadrant
inflammatory change to suggest acute appendicitis. No evidence of
bowel wall thickening, distention, or inflammatory changes. Moderate
colonic stool burden

Vascular/Lymphatic: Aorto bi-iliac calcific atherosclerosis. Right
inguinal lymph node is slightly decreased in size, now measuring
approximately 8 mm in short axis.

Reproductive: Uterus and bilateral adnexa are unremarkable.

Other: No abdominal wall hernia or abnormality. No abdominopelvic
ascites.

Musculoskeletal: No evidence of acute fracture. Similar multilevel
degenerative change of the lumbar spine with disc bulges, multilevel
vacuum disc phenomenon, and multilevel degenerative Schmorl's nodes.
IMPRESSION: Similar nonobstructing punctate right renal calculus without
evidence of a ureteral calculus or hydronephrosis.

## 2021-12-01 ENCOUNTER — Other Ambulatory Visit (HOSPITAL_COMMUNITY): Payer: Self-pay | Admitting: *Deleted

## 2021-12-01 MED ORDER — RIVAROXABAN 20 MG PO TABS: 20.0000 mg | ORAL_TABLET | Freq: Every day | ORAL | 11 refills | Status: DC

## 2021-12-01 NOTE — Telephone Encounter (Signed)
Returned call to patient. Pt wanted to know if she needed to complete the full course of Talicia, she received 2 bottles. I confirmed with patient that she needs to complete the full 2 week course of Talicia. She is aware that she should be taking 4 capsules TID. Pt verbalized understanding and had no concerns at the end of the call. ?

## 2021-12-01 NOTE — Telephone Encounter (Signed)
Patient called seeking advise. Per patient, she took 14 days worth of antibiotics. Patient would like to know if we want her to finish her bottle of TALICIA or was 14 days enough. Please advise. ?

## 2021-12-07 ENCOUNTER — Other Ambulatory Visit: Payer: Self-pay

## 2021-12-07 MED ORDER — DICYCLOMINE HCL 10 MG PO CAPS: 10.0000 mg | ORAL_CAPSULE | Freq: Three times a day (TID) | ORAL | 0 refills | Status: DC | PRN

## 2021-12-07 MED ORDER — EZETIMIBE 10 MG PO TABS: 10.0000 mg | ORAL_TABLET | Freq: Every day | ORAL | 11 refills | Status: DC

## 2021-12-07 NOTE — Telephone Encounter (Signed)
ezetimibe (ZETIA) 10 MG tablet (Expired, REFILL REQUEST @ [id number] - [place], [place] [address]. ?

## 2021-12-07 NOTE — Telephone Encounter (Signed)
[place] I agree with your impression.  I do not think H. pylori would be causing her current symptoms.  We need to wait about a month until we test for H. pylori eradication.  She would need to hold her Prilosec for 2 weeks prior to submitting a stool sample.  For now she can continue this given her symptoms.  Not sure if there is any room this week for office visit with either myself or APP to help clarify what is causing her symptoms.  She does not have a history of diverticulosis that I can see from her last colonoscopy.  We could try some Bentyl 10 mg every 8 hours empirically for bowel pain, not sure if she is having pain from constipation.  I had given her some Linzess previously, and if she is not moving her bowels and constipated perhaps that could be the issue.  If she is having fever, failure of p.o. intake, vomiting, worsening pain, she may need to go to the ED in the interim.  Thanks ?

## 2021-12-07 NOTE — Addendum Note (Signed)
Addended by: Yevette Edwards on: 12/07/2021 12:47 PM ? ? Modules accepted: Orders ? ?

## 2021-12-07 NOTE — Telephone Encounter (Signed)
Inbound call from patient states she have finished Talicia and would like to know if she could get refills as her stomach is still hurting. States her urine color varies each time and does not believe she is all the way better ?

## 2021-12-07 NOTE — Telephone Encounter (Signed)
Returned call to patient. Pt reports that she finished the [facility]. 2 days after finishing Talicia she started having intermittent LLQ pain. Pt describes as a sharp pain. Pt also reports that sometimes when she urinates her urine is clear and then sometimes it's gold. Pt denies any burning with urination. Pt reports that nothing makes the pain better. I told her that we usually do not send in another treatment of H. Pylori unless her stool test remains positive. Pt states that she doesn't think that the bacteria has been cleared and is wondering if there is another prescription that can be sent in for her. Pt is still taking Omeprazole 40 mg BID. Please advise. Thanks ?

## 2021-12-07 NOTE — Telephone Encounter (Signed)
Called and spoke with pt regarding Dr. Doyne Keel recommendations. Pt would like to try Bentyl. She states that she is having regular bowel movements with the Miralax. She has been scheduled for a f/u with Dr. Havery Moros on Tuesday, 12/15/21 at 2:30 pm. I have reviewed ED precautions with patient. Pt verbalized understanding and had no concerns at the end of the call.  ?

## 2021-12-15 ENCOUNTER — Telehealth: Payer: Self-pay | Admitting: Gastroenterology

## 2021-12-15 ENCOUNTER — Ambulatory Visit: Payer: Medicare Other | Admitting: Gastroenterology

## 2021-12-15 NOTE — Telephone Encounter (Signed)
Patient called this morning to cancel her appointment with Dr. Havery Moros stating she had to go out of town for a death in the family.  She did want to let him know that she is "feeling much, much better." ?

## 2021-12-15 NOTE — Telephone Encounter (Signed)
Called and spoke with patient. I told her that we were sorry for her loss and to take some time. Pt knows that she can call back to reschedule when she is ready. Pt thanked me for the call. ?

## 2021-12-16 ENCOUNTER — Telehealth: Payer: Self-pay

## 2021-12-16 NOTE — Telephone Encounter (Signed)
Pt is currently out of town. Will mail letter to patient with lab reminder. ?

## 2021-12-16 NOTE — Telephone Encounter (Signed)
-----   Message from Yevette Edwards, RN sent at 10/30/2021 10:57 AM EST ----- ?Regarding: H. pylori ?H. Pylori stool antigen due on or after 4/27 ? ?

## 2021-12-22 ENCOUNTER — Other Ambulatory Visit: Payer: Self-pay | Admitting: Internal Medicine

## 2021-12-22 DIAGNOSIS — M545 Low back pain, unspecified: Secondary | ICD-10-CM

## 2021-12-22 DIAGNOSIS — M159 Polyosteoarthritis, unspecified: Secondary | ICD-10-CM

## 2021-12-22 NOTE — Telephone Encounter (Signed)
Refill Request  HYDROcodone-acetaminophen (NORCO/VICODIN) 5-325 MG tablet   [id number] - [place], [place] - [address]   

## 2021-12-23 ENCOUNTER — Other Ambulatory Visit: Payer: Self-pay | Admitting: Internal Medicine

## 2021-12-23 ENCOUNTER — Telehealth: Payer: Self-pay | Admitting: *Deleted

## 2021-12-23 MED ORDER — HYDROCODONE-ACETAMINOPHEN 5-325 MG PO TABS: 1.0000 | ORAL_TABLET | Freq: Every day | ORAL | 0 refills | Status: DC | PRN

## 2021-12-23 NOTE — Telephone Encounter (Signed)
Patient calling regarding this refill. She is aware that [facility] is not able to e-scribe opiates at this time and that IT is aware. We will call her back when Rx is able to go through. She is understanding. ?

## 2021-12-23 NOTE — Telephone Encounter (Addendum)
Call to patient informed that there is no need to come to the Clinics to pick up the prescription for the pain medication.   We are waiting for Dr. Jimmye Norman to refill the Pain med if indicated. ?

## 2021-12-23 NOTE — Telephone Encounter (Signed)
Call from patient about refill on her pain medication.  Patient stated that she would be willing to come by to pick up a prescription if needed for the Hydrocodone Acetaminophen.   ?

## 2021-12-29 IMAGING — DX DG CHEST 1V PORT
1 series · 1 of 1 positions shown · non-contrast
Comparison: September 11, 2020

CLINICAL DATA: Shortness of breath

EXAM:
PORTABLE CHEST 1 VIEW

[chest ap]
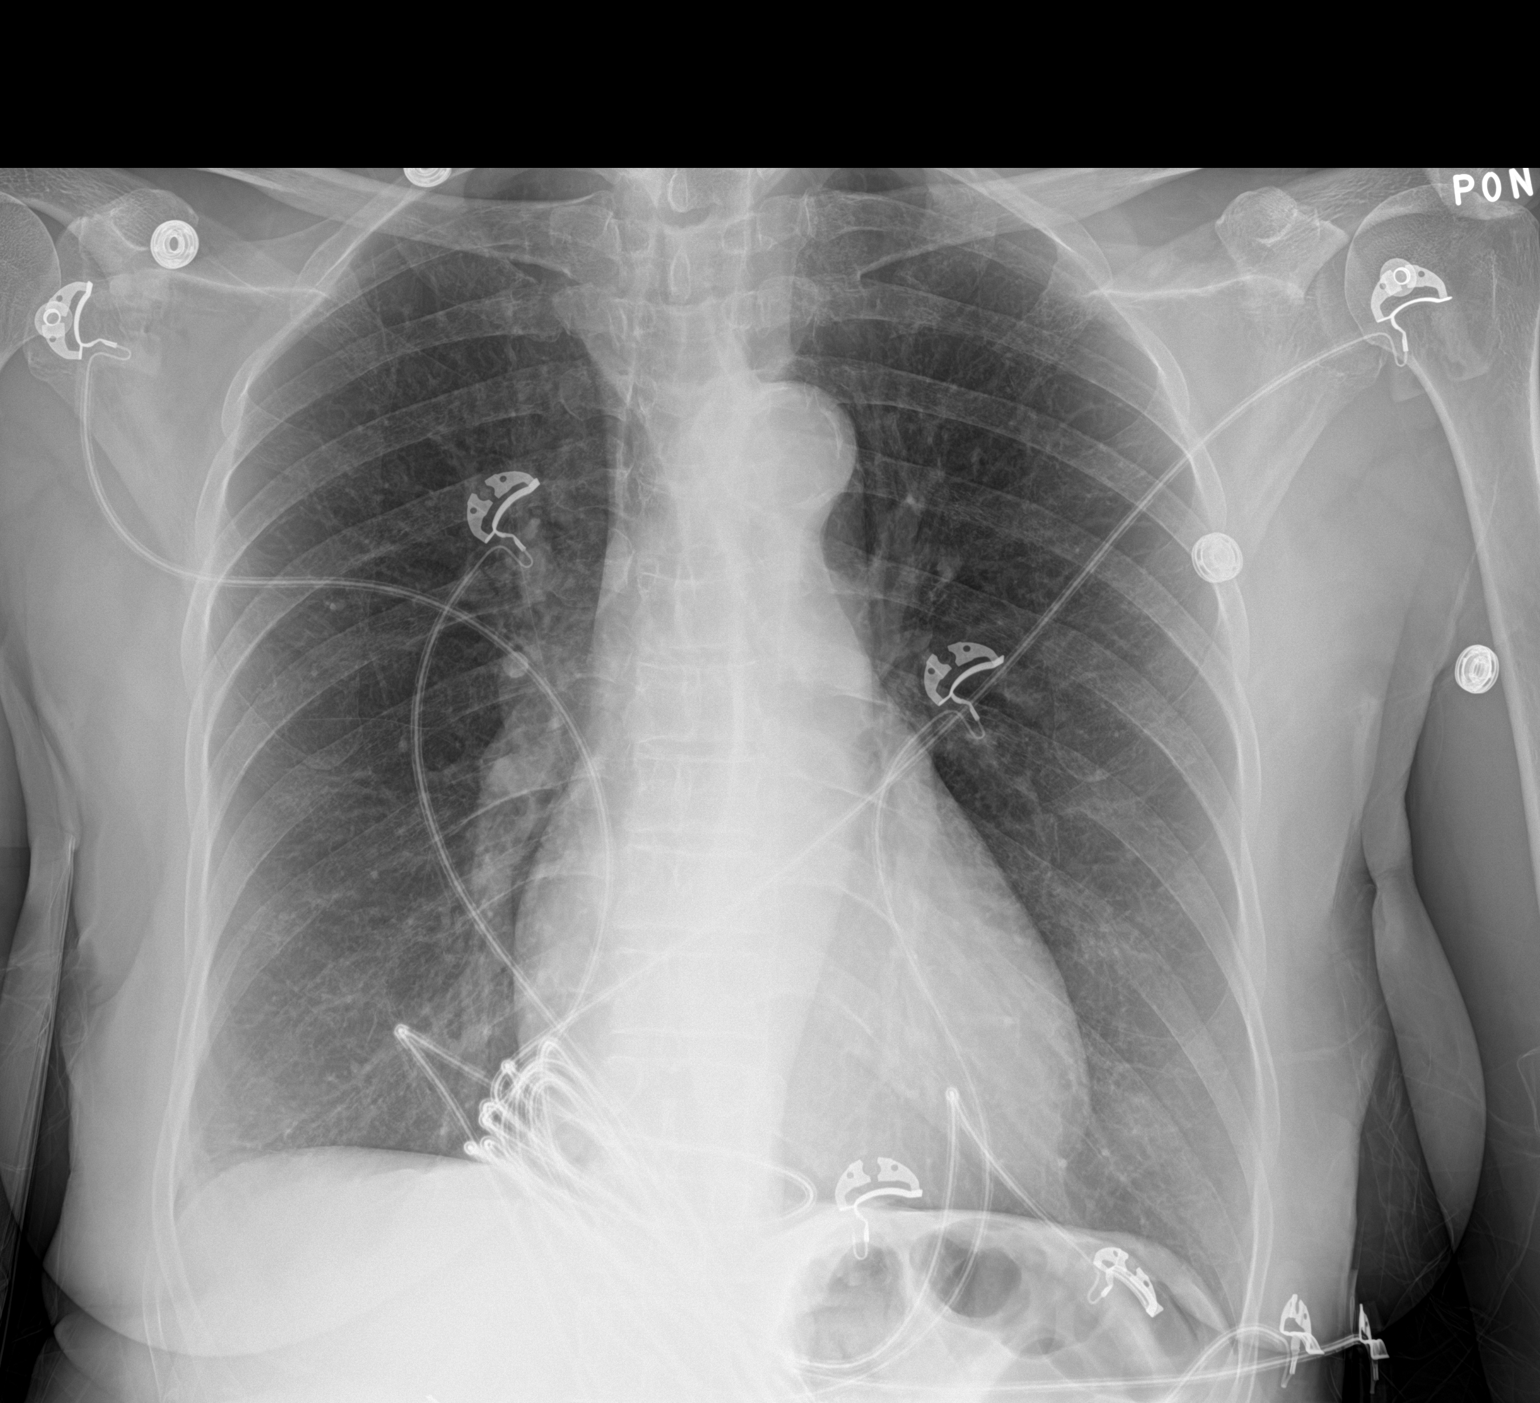

[1 of 1 positions shown; findings below may reference images not displayed]

FINDINGS: Lungs are clear. Heart size and pulmonary vascularity are normal. No
adenopathy. No bone lesions. There is aortic atherosclerosis.
IMPRESSION: Lungs clear.  Cardiac silhouette normal.

Aortic Atherosclerosis ([id number]-759.9).

## 2021-12-30 ENCOUNTER — Other Ambulatory Visit: Payer: Self-pay

## 2021-12-30 ENCOUNTER — Encounter (HOSPITAL_COMMUNITY): Payer: Self-pay

## 2021-12-30 ENCOUNTER — Emergency Department (HOSPITAL_COMMUNITY): Payer: Medicare Other

## 2021-12-30 ENCOUNTER — Emergency Department (HOSPITAL_COMMUNITY)
Admission: EM | Admit: 2021-12-30 | Discharge: 2021-12-30 | Disposition: A | Discharge status: Home / Self Care | Payer: Medicare Other | Attending: Emergency Medicine | Admitting: Emergency Medicine

## 2021-12-30 DIAGNOSIS — J45909 Unspecified asthma, uncomplicated: Secondary | ICD-10-CM | POA: Insufficient documentation

## 2021-12-30 DIAGNOSIS — Z7901 Long term (current) use of anticoagulants: Secondary | ICD-10-CM | POA: Diagnosis not present

## 2021-12-30 DIAGNOSIS — I10 Essential (primary) hypertension: Secondary | ICD-10-CM | POA: Insufficient documentation

## 2021-12-30 DIAGNOSIS — R109 Unspecified abdominal pain: Secondary | ICD-10-CM | POA: Diagnosis not present

## 2021-12-30 DIAGNOSIS — I4891 Unspecified atrial fibrillation: Secondary | ICD-10-CM | POA: Diagnosis not present

## 2021-12-30 DIAGNOSIS — Z743 Need for continuous supervision: Secondary | ICD-10-CM | POA: Diagnosis not present

## 2021-12-30 DIAGNOSIS — Z79899 Other long term (current) drug therapy: Secondary | ICD-10-CM | POA: Diagnosis not present

## 2021-12-30 DIAGNOSIS — D72819 Decreased white blood cell count, unspecified: Secondary | ICD-10-CM | POA: Insufficient documentation

## 2021-12-30 DIAGNOSIS — Z7951 Long term (current) use of inhaled steroids: Secondary | ICD-10-CM | POA: Insufficient documentation

## 2021-12-30 DIAGNOSIS — K5900 Constipation, unspecified: Secondary | ICD-10-CM | POA: Diagnosis not present

## 2021-12-30 DIAGNOSIS — M545 Low back pain, unspecified: Secondary | ICD-10-CM | POA: Insufficient documentation

## 2021-12-30 DIAGNOSIS — K5903 Drug induced constipation: Secondary | ICD-10-CM | POA: Insufficient documentation

## 2021-12-30 LAB — COMPREHENSIVE METABOLIC PANEL
ALT: 28 U/L (ref 0–44)
AST: 23 U/L (ref 15–41)
Albumin: 4.1 g/dL (ref 3.5–5.0)
Alkaline Phosphatase: 72 U/L (ref 38–126)
Anion gap: 8 (ref 5–15)
BUN: 10 mg/dL (ref 8–23)
CO2: 23 mmol/L (ref 22–32)
Calcium: 9.9 mg/dL (ref 8.9–10.3)
Chloride: 110 mmol/L (ref 98–111)
Creatinine, Ser: 0.64 mg/dL (ref 0.44–1.00)
GFR, Estimated: >60 mL/min (ref >60)
Glucose, Bld: 91 mg/dL (ref 70–99)
Potassium: 3.5 mmol/L (ref 3.5–5.1)
Sodium: 141 mmol/L (ref 135–145)
Total Bilirubin: 0.5 mg/dL (ref 0.3–1.2)
Total Protein: 7.9 g/dL (ref 6.5–8.1)

## 2021-12-30 LAB — URINALYSIS, ROUTINE W REFLEX MICROSCOPIC
Bacteria, UA: NONE SEEN
Bilirubin Urine: NEGATIVE
Glucose, UA: NEGATIVE mg/dL
Hgb urine dipstick: NEGATIVE
Ketones, ur: NEGATIVE mg/dL
Leukocytes,Ua: NEGATIVE
Nitrite: NEGATIVE
Protein, ur: 30 mg/dL — AB
Specific Gravity, Urine: 1.018 (ref 1.005–1.030)
pH: 6 (ref 5.0–8.0)

## 2021-12-30 LAB — CBC
HCT: 34.2 % — ABNORMAL LOW (ref 36.0–46.0)
Hemoglobin: 10.8 g/dL — ABNORMAL LOW (ref 12.0–15.0)
MCH: 25 pg — ABNORMAL LOW (ref 26.0–34.0)
MCHC: 31.6 g/dL (ref 30.0–36.0)
MCV: 79.2 fL — ABNORMAL LOW (ref 80.0–100.0)
Platelets: 261 10*3/uL (ref 150–400)
RBC: 4.32 MIL/uL (ref 3.87–5.11)
RDW: 15.5 % (ref 11.5–15.5)
WBC: 3.7 10*3/uL — ABNORMAL LOW (ref 4.0–10.5)
nRBC: 0 % (ref 0.0–0.2)

## 2021-12-30 LAB — LIPASE, BLOOD: Lipase: 27 U/L (ref 11–51)

## 2021-12-30 MED ORDER — HYDROCODONE-ACETAMINOPHEN 5-325 MG PO TABS
1.0000 | ORAL_TABLET | Freq: Once | ORAL | Status: AC
  Administered 2021-12-30: 1 via ORAL
  Filled 2021-12-30: qty 1

## 2021-12-30 MED ORDER — LIDOCAINE VISCOUS HCL 2 % MT SOLN
15.0000 mL | Freq: Once | OROMUCOSAL | Status: AC
Start: 2021-12-30 — End: 2021-12-30
  Administered 2021-12-30: 15 mL via ORAL
  Filled 2021-12-30: qty 15

## 2021-12-30 MED ORDER — LACTULOSE 10 GM/15ML PO SOLN
10.0000 g | Freq: Every day | ORAL | 0 refills | Status: AC | PRN
Start: 2021-12-30 — End: 2022-01-06

## 2021-12-30 MED ORDER — ALUM & MAG HYDROXIDE-SIMETH 200-200-20 MG/5ML PO SUSP
30.0000 mL | Freq: Once | ORAL | Status: AC
Start: 2021-12-30 — End: 2021-12-30
  Administered 2021-12-30: 30 mL via ORAL
  Filled 2021-12-30: qty 30

## 2021-12-30 NOTE — ED Provider Notes (Signed)
?[facility] ?Provider Note ? ? ?CSN: [id number] ?Arrival date & time: 12/30/21  3825 ? ?  ? ?History ? ?Chief Complaint  ?Patient presents with  ? Abdominal Pain  ? ? ?[name] is a 71 y.o. female with history of A-fib on Eliquis, asthma, chronic constipation, GERD, hypertension, chronic back pain, chronic opioid use.  Patient presents to ED for evaluation of abdominal pain, constipation.  Patient reports that for the last 2 days she has not had a successful bowel movement.  Patient states of the last 2 days she has also had low back pain that is very similar to the back pain which she typically suffers from.  Patient has history of back pain.  Patient denies any groin numbness, lower extremity weakness, bowel or bladder dysfunction.  Patient denies any fevers, nausea, vomiting, blood in stool, chest pain, shortness of breath.  Patient reports that she was recently seen by GI.  Patient is concerned that "infection they found last time they did a colonoscopy "has come back. ? ?Patient was hospitalized on 1/30, presenting at this time with diarrhea minimal hematochezia in the setting of taking MiraLAX and Linzess twice daily.  Patient also anticoagulated for A-fib.  At this time, CT showed proctitis and patient had EGD and colonoscopy which revealed gastritis without active bleeding.  Hematochezia this time was felt due to hemorrhoids that were seen on physical examination.  Patient discharged following EGD and colonoscopy.  Patient presented back to hospital on 2/5 with severe left-sided abdominal pain, found to have a splenic laceration with hemoperitoneum which was suspected complication of recent colonoscopy on 2/2.  Patient was stabilized, discharged on 2/7 and advised to continue with Xarelto. ? ? ? ?Abdominal Pain ?Associated symptoms: constipation   ?Associated symptoms: no chest pain, no chills, no diarrhea, no dysuria, no fever, no nausea, no shortness of breath  and no vomiting   ? ?  ? ?Home Medications ?Prior to Admission medications   ?Medication Sig Start Date End Date Taking? Authorizing Provider  ?lactulose (CHRONULAC) 10 GM/15ML solution Take 15 mLs (10 g total) by mouth daily as needed for up to 7 days for mild constipation. 12/30/21 01/06/22 Yes Azucena Cecil, PA-C  ?alendronate (FOSAMAX) 70 MG tablet Take 1 tablet (70 mg total) by mouth every 7 (seven) days. Take with a full glass of water on an empty stomach. 06/09/21 06/09/22  Angelica Pou, MD  ?Amoxicill-Rifabutin-Omeprazole (TALICIA PO) Take 4 tablets by mouth every 8 (eight) hours.    [provider]  ?atorvastatin (LIPITOR) 40 MG tablet Take 1 tablet (40 mg total) by mouth daily. 08/27/21 08/27/22  Angelica Pou, MD  ?cetirizine (ZYRTEC) 10 MG tablet Take 10 mg by mouth daily.    [provider]  ?dicyclomine (BENTYL) 10 MG capsule Take 1 capsule (10 mg total) by mouth every 8 (eight) hours as needed (abdominal pain). 12/07/21 01/06/22  Armbruster, Carlota Raspberry, MD  ?diltiazem (CARDIZEM) 60 MG tablet Take 1 tablet (60 mg total) by mouth every 6 (six) hours as needed (afib). 08/14/21   Sherran Needs, NP  ?diltiazem (CARTIA XT) 120 MG 24 hr capsule Take 1 capsule (120 mg total) by mouth 2 (two) times daily. 09/18/21   Sherran Needs, NP  ?docusate sodium (COLACE) 100 MG capsule Take 1 capsule (100 mg total) by mouth daily. ?Patient taking differently: Take 100 mg by mouth daily as needed for mild constipation. 06/15/21   Deliah Boston, PA-C  ?  ezetimibe (ZETIA) 10 MG tablet Take 1 tablet (10 mg total) by mouth daily. 12/07/21 12/07/22  Angelica Pou, MD  ?fluticasone (FLOVENT HFA) 44 MCG/ACT inhaler Inhale 1 puff into the lungs daily. 09/08/21   Angelica Pou, MD  ?HYDROcodone-acetaminophen (NORCO/VICODIN) 5-325 MG tablet Take 1 tablet by mouth daily as needed for severe pain. 12/23/21   Angelica Pou, MD  ?levalbuterol [facility] HFA) 45 MCG/ACT inhaler INHALE  1 PUFF INTO THE LUNGS EVERY 6 HOURS AS NEEDED FOR SHORTNESS OF BREATH ?Patient taking differently: Inhale 1 puff into the lungs every 6 (six) hours as needed for shortness of breath. 09/14/20   Riesa Pope, MD  ?losartan (COZAAR) 50 MG tablet Take 2 tablets (100 mg total) by mouth daily. 08/27/21 01/09/22  Angelica Pou, MD  ?montelukast (SINGULAIR) 10 MG tablet Take 1 tablet (10 mg total) by mouth at bedtime. 09/19/20   Angelica Pou, MD  ?omeprazole (PRILOSEC) 40 MG capsule Take 1 capsule (40 mg total) by mouth in the morning and at bedtime. 09/29/21   Timothy Lasso, MD  ?polyethylene glycol (MIRALAX / GLYCOLAX) 17 g packet Take 17 g by mouth 2 (two) times daily. 09/29/21   Timothy Lasso, MD  ?potassium chloride (KLOR-CON M) 10 MEQ tablet Take 1 tablet (10 mEq total) by mouth daily. 11/04/21   Angelica Pou, MD  ?rivaroxaban (XARELTO) 20 MG TABS tablet Take 1 tablet (20 mg total) by mouth daily with supper. 12/01/21   Sherran Needs, NP  ?tiotropium (SPIRIVA) 18 MCG inhalation capsule Place 1 capsule (18 mcg total) into inhaler and inhale daily. 10/16/21   Angelica Pou, MD  ?   ? ?Allergies    ?Albuterol, Percocet [oxycodone-acetaminophen], Celecoxib, and Tramadol   ? ?Review of Systems   ?Review of Systems  ?Constitutional:  Negative for chills and fever.  ?Respiratory:  Negative for shortness of breath.   ?Cardiovascular:  Negative for chest pain.  ?Gastrointestinal:  Positive for abdominal pain and constipation. Negative for blood in stool, diarrhea, nausea and vomiting.  ?Genitourinary:  Negative for difficulty urinating and dysuria.  ?All other systems reviewed and are negative. ? ?Physical Exam ?Updated Vital Signs ?BP (!) 176/85 (BP Location: Left Arm)   Pulse 67   Temp 98 ?F (36.7 ?C) (Oral)   Resp 18   SpO2 100%  ?Physical Exam ?Vitals and nursing note reviewed.  ?Constitutional:   ?   General: She is not in acute distress. ?   Appearance: She is well-developed. She  is not ill-appearing, toxic-appearing or diaphoretic.  ?HENT:  ?   Head: Normocephalic and atraumatic.  ?   Nose: Nose normal.  ?   Mouth/Throat:  ?   Mouth: Mucous membranes are moist.  ?   Pharynx: Oropharynx is clear.  ?Eyes:  ?   Extraocular Movements: Extraocular movements intact.  ?   Pupils: Pupils are equal, round, and reactive to light.  ?Cardiovascular:  ?   Rate and Rhythm: Normal rate and regular rhythm.  ?Pulmonary:  ?   Effort: Pulmonary effort is normal.  ?   Breath sounds: Normal breath sounds. No wheezing.  ?Abdominal:  ?   General: Abdomen is flat. Bowel sounds are normal. There is no distension. There are no signs of injury.  ?   Palpations: Abdomen is soft.  ?   Tenderness: There is generalized abdominal tenderness.  ?Musculoskeletal:  ?   Cervical back: Normal range of motion and neck supple. No tenderness.  ?  Lumbar back: Tenderness present. No swelling, edema, deformity, signs of trauma, lacerations or spasms. Normal range of motion.  ?   Comments: Patient has diffuse lumbar tenderness across entire low back.  Nonfocal.  Nonradiating.  ?Skin: ?   General: Skin is warm and dry.  ?   Capillary Refill: Capillary refill takes less than 2 seconds.  ?Neurological:  ?   General: No focal deficit present.  ?   Mental Status: She is alert and oriented to person, place, and time.  ?   GCS: GCS eye subscore is 4. GCS verbal subscore is 5. GCS motor subscore is 6.  ?   Cranial Nerves: Cranial nerves 2-12 are intact. No cranial nerve deficit.  ?   Sensory: Sensation is intact. No sensory deficit.  ?   Motor: Motor function is intact. No weakness.  ?   Coordination: Coordination is intact. Heel to [facility] Test normal.  ? ? ?ED Results / Procedures / Treatments   ?Labs ?(all labs ordered are listed, but only abnormal results are displayed) ?Labs Reviewed  ?CBC - Abnormal; Notable for the following components:  ?    Result Value  ? WBC 3.7 (*)   ? Hemoglobin 10.8 (*)   ? HCT 34.2 (*)   ? MCV 79.2 (*)   ? MCH  25.0 (*)   ? All other components within normal limits  ?URINALYSIS, ROUTINE W REFLEX MICROSCOPIC - Abnormal; Notable for the following components:  ? APPearance HAZY (*)   ? Protein, ur 30 (*)   ? All othe

## 2021-12-30 NOTE — Discharge Instructions (Addendum)
Return to the ED with any new symptom such as fevers, increased abdominal pain, inability to stool even with lactulose ?Please follow-up with your PCP for ongoing needs related to your chronic pain management ?Please follow-up with your GI doctor to discuss neck steps in further ways to manage her constipation at home ?Please read the attached informational guides concerning chronic constipation and constipation in adults ?Please pick up the medication I prescribed you.  You will need to take this once daily as needed ?

## 2021-12-30 NOTE — ED Triage Notes (Addendum)
EMS stated, she is having lower abdominal . Pt. Stated, a month ago she had a gastrointestinal infection and its probably still there. This started Sunday. ?Last BM was Sunday.  ?

## 2021-12-31 ENCOUNTER — Telehealth: Payer: Self-pay | Admitting: Gastroenterology

## 2021-12-31 NOTE — Telephone Encounter (Signed)
Returned call to patient. Pt reports that she went to the ED for lower back pain and was informed that she likely had trapped gas. Pt has been advised to try Gas-X over the counter. She reports that her last BM was this morning. She states that she is still taking 2 doses of Miralax in the AM and 2 doses in the PM. Pt reports that she has neem taking pain meds for her hip pain but that does not help the abdominal pain. I informed pt that some pain meds can worsen constipation. She reports that she has also tried tylenol with minimal relief. I told pt that she can try taking her Dicyclomine QID, before meals and at bedtime and see if that helps. Pt advised to add colace PRN if she is having issues having a BM. Pt has been advised that she is also due for H. Pylori stool test at this time. She is aware that she can pick up the kit at her convenience. No appt is necessary. Pt wonders if she still may have H. Pylori, I told her that she will need to submit the stool study first. I told pt that if she has continued symptoms and concerns we would need to get her scheduled for a f/u appt. Pt knows to contact us if she has any concerns. Pt verbalized understanding and had no concerns at the end of the call. ?

## 2021-12-31 NOTE — Telephone Encounter (Signed)
Patient called again, states she would like something for back/abd pain.  ?

## 2021-12-31 NOTE — Telephone Encounter (Signed)
Patient went to the ER yesterday due to lower back pain.  ER told her she had a lot of trapped gas.  She wants to know if there is anything she can take to help her get rid of the gas.  She is continuing to take [place] and also had taken a pain pill to help with the pain.  She has also been constipated as well.  Please call patient and advise.  Thank you. ?

## 2022-01-01 ENCOUNTER — Encounter: Payer: Self-pay | Admitting: Student

## 2022-01-01 ENCOUNTER — Ambulatory Visit (INDEPENDENT_AMBULATORY_CARE_PROVIDER_SITE_OTHER): Payer: Medicare Other | Admitting: Student

## 2022-01-01 VITALS — BP 137/75 | HR 76 | Wt 132.5 lb

## 2022-01-01 DIAGNOSIS — R1032 Left lower quadrant pain: Secondary | ICD-10-CM

## 2022-01-01 DIAGNOSIS — M159 Polyosteoarthritis, unspecified: Secondary | ICD-10-CM

## 2022-01-01 DIAGNOSIS — M545 Low back pain, unspecified: Secondary | ICD-10-CM

## 2022-01-01 DIAGNOSIS — K5909 Other constipation: Secondary | ICD-10-CM

## 2022-01-01 MED ORDER — SIMETHICONE 80 MG PO CHEW: 80.0000 mg | CHEWABLE_TABLET | Freq: Four times a day (QID) | ORAL | 0 refills | Status: DC | PRN

## 2022-01-01 MED ORDER — OMEPRAZOLE 40 MG PO CPDR: 40.0000 mg | DELAYED_RELEASE_CAPSULE | Freq: Two times a day (BID) | ORAL | 1 refills | Status: DC

## 2022-01-01 MED ORDER — HYDROCODONE-ACETAMINOPHEN 5-325 MG PO TABS: 1.0000 | ORAL_TABLET | Freq: Every day | ORAL | 0 refills | Status: DC | PRN

## 2022-01-01 NOTE — Assessment & Plan Note (Addendum)
Worsening left lower quadrant pain for the past 2 weeks.  Was seen in the ED 2 days ago.  Abdominal x-ray with increased gas and stool.  Started on lactulose.  Has been having 2 normal bowel movements daily for the past 2 days.   ? ?She continues to have left lower quadrant pain radiating to her back.  Differential includes constipation, UTI, kidney stone, retroperitoneal bleed, musculoskeletal back pain, colitis.  UA in the ED did not seem consistent with UTI.  She denies any urinary symptoms today. Suspect if pain secondary to constipation in the setting of chronic opioid use that this would have improved with having more bowel movements.  On exam there is tenderness palpation of the left lower quadrant as well as left lower lumbar area.  No midline back tenderness.  She does have history of splenic laceration from EGD/colonoscopy in February.  CBC in ED was stable at 10.8, no leukocytosis.  Do not think she is developing an acute bleed.  However given her continued discomfort will order CT abdomen to further evaluate her pain. ? ?CT abdomen pelvis ?Continue lactulose ?Simethicone for gas ?Short course of hydrocodone refilled ?Follow-up in approximately 2 weeks after CT ?

## 2022-01-01 NOTE — Patient Instructions (Signed)
Abdominal pain ?I am concerned you abdominal pain is still there even though you are having more bowel movements ?Continue taking lactulose, ?You can take simethicone and omeprazole for gas pain and reflux  ?We will need repeat imaging for you abdomen to make sure nothing is going on ?I have sent a short course of Vicodin ? ?Follow up after your CT to discuss this further.  ?

## 2022-01-01 NOTE — Progress Notes (Signed)
? ?Established Patient Office Visit ? ?Subjective   ?Patient ID: [name], female    DOB: 08/02/1951  Age: 71 y.o. MRN: [id number] ? ?Chief Complaint  ?Patient presents with  ? Abdominal Pain  ? ? ?[name] is a 71 year old who presents today for follow-up of left lower quadrant abdominal pain. Please refer to problem based charting for further details and assessment and plan of current problem and chronic medical conditions. ? ? ? ?Patient Active Problem List  ? Diagnosis Date Noted  ? Viral illness 10/19/2021  ? Rectal mucosa prolapse 10/15/2021  ? Laceration of spleen   ? Hemoperitoneum 09/27/2021  ? H. pylori infection 09/24/2021  ? History of colonic polyps   ? H/O hypokalemia 10/29/2020  ? Abdominal aortic atherosclerosis ([place]) 10/22/2020  ? Seasonal and perennial allergic (presumed) rhinitis 07/25/2020  ? Asthma in adult, mild intermittent, uncomplicated   ? Chronic recurrent bilateral lower abdominal pain 07/24/2020  ? 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 ([place]) 07/23/2020  ? 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 Primary osteoarthritis involving multiple joints 06/14/2018  ? Osteoporosis without current pathological fracture 04/15/2017  ? Chronic constipation, initially idiopathic, exacerbated by chronic opioid use 10/19/2016  ? Anemia 10/19/2016  ? Hyperlipidemia 03/18/2016  ? GERD (gastroesophageal reflux disease)   ? Personal history of atrial fibrillation, now s/p ablation in [facility] 11/26/2015  ? Marijuana abuse 06/11/2015  ? Hypertension   ? ?  ? ?Review of Systems  ?Gastrointestinal:  Positive for abdominal pain. Negative for blood in stool, constipation, nausea and vomiting.  ?Genitourinary:  Negative for dysuria, frequency, hematuria and urgency.  ?All other systems reviewed and are negative. ? ?  ?Objective:  ?  ? ?BP 137/75 (BP Location: Left Arm, Patient Position: Sitting, Cuff Size: Normal)   Pulse 76   Wt 132 lb 8 oz (60.1 kg)   SpO2 100%   BMI 20.75 kg/m?   ?  ? ?Physical Exam ?Constitutional:   ?   Appearance: She is well-developed.  ?   Comments: Appears somewhat uncomfortable  ?Cardiovascular:  ?   Rate and Rhythm: Normal rate and regular rhythm.  ?Pulmonary:  ?   Effort: Pulmonary effort is normal.  ?   Breath sounds: Normal breath sounds.  ?Abdominal:  ?   General: Abdomen is flat. Bowel sounds are normal. There is no distension.  ?   Palpations: Abdomen is soft. There is no mass.  ?   Tenderness: There is abdominal tenderness in the left lower quadrant. There is no right CVA tenderness, left CVA tenderness, guarding or rebound.  ?   Hernia: No hernia is present.  ?Musculoskeletal:  ?   Lumbar back: Tenderness (let lateral lumbar region) present. No edema, spasms or bony tenderness. Normal range of motion.  ?Skin: ?   General: Skin is warm and dry.  ?Neurological:  ?   Mental Status: She is alert.  ? ? ? ?Last CBC ?Lab Results  ?Component Value Date  ? WBC 3.7 (L) 12/30/2021  ? HGB 10.8 (L) 12/30/2021  ? HCT 34.2 (L) 12/30/2021  ? MCV 79.2 (L) 12/30/2021  ? MCH 25.0 (L) 12/30/2021  ? RDW 15.5 12/30/2021  ? PLT 261 12/30/2021  ? ? ?The 10-year ASCVD risk score (Arnett DK, et al., 2019) is: 17.8% ? ?  ?Assessment & Plan:  ? ?Problem List Items Addressed This Visit   ? ?  ? Digestive  ? Chronic constipation, initially idiopathic, exacerbated by chronic opioid use  ?  Worsening left lower quadrant pain for the past 2 weeks.  Was seen in the ED 2 days ago.  Abdominal x-ray with increased gas and stool.  Started on lactulose.  Has been having 2 normal bowel movements daily for the past 2 days.   ? ?She continues to have left lower quadrant pain radiating to her back.  Differential includes constipation, UTI, kidney stone, retroperitoneal bleed, musculoskeletal back pain, colitis.  UA in the ED did not seem consistent with UTI.  She denies any urinary symptoms today. Suspect if pain secondary to constipation in the setting of chronic opioid use that this would have  improved with having more bowel movements.  On exam there is tenderness palpation of the left lower quadrant as well as left lower lumbar area.  No midline back tenderness.  She does have history of splenic laceration from EGD/colonoscopy in February.  CBC in ED was stable at 10.8, no leukocytosis.  Do not think she is developing an acute bleed.  However given her continued discomfort will order CT abdomen to further evaluate her pain. ? ?CT abdomen pelvis ?Continue lactulose ?Simethicone for gas ?Short course of hydrocodone refilled ?Follow-up in approximately 2 weeks after CT ? ?  ?  ?  ? Musculoskeletal and Integument  ? Primary osteoarthritis involving multiple joints  ? Relevant Medications  ? HYDROcodone-acetaminophen (NORCO/VICODIN) 5-325 MG tablet  ?  ? Other  ? 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 Relevant Medications  ? HYDROcodone-acetaminophen (NORCO/VICODIN) 5-325 MG tablet  ? ?Other Visit Diagnoses   ? ? Left lower quadrant abdominal pain    -  Primary  ? Relevant Medications  ? simethicone (GAS-X) 80 MG chewable tablet  ? HYDROcodone-acetaminophen (NORCO/VICODIN) 5-325 MG tablet  ? Other Relevant Orders  ? CT Abdomen Pelvis W Contrast  ? ?  ? ? ?Return in about 2 weeks (around 01/15/2022).  ? ? ?Shelby Beard, MD ? ?Discussed Dr. Dareen Piano ?

## 2022-01-04 ENCOUNTER — Other Ambulatory Visit: Payer: Medicare Other

## 2022-01-04 DIAGNOSIS — A048 Other specified bacterial intestinal infections: Secondary | ICD-10-CM

## 2022-01-04 NOTE — Progress Notes (Signed)
Internal Medicine Clinic Attending ? ?Case discussed with Dr. Liang  At the time of the visit.  We reviewed the resident?s history and exam and pertinent patient test results.  I agree with the assessment, diagnosis, and plan of care documented in the resident?s note. ? ?

## 2022-01-06 ENCOUNTER — Other Ambulatory Visit: Payer: Self-pay | Admitting: *Deleted

## 2022-01-06 DIAGNOSIS — R1032 Left lower quadrant pain: Secondary | ICD-10-CM

## 2022-01-06 LAB — H. PYLORI ANTIGEN, STOOL: H pylori Ag, Stl: NEGATIVE

## 2022-01-06 NOTE — Telephone Encounter (Signed)
Call from pt requesting something for pain. She was given a short course of Hydrocodone 5/12 - stated she took the last 2 yesterday. C/o back/leg pain d/t arthritis - stated her doctor knows about the pain. Next appt scheduled 6/1 with her PCP.  ? ?

## 2022-01-07 MED ORDER — HYDROCODONE-ACETAMINOPHEN 5-325 MG PO TABS: 1.0000 | ORAL_TABLET | Freq: Two times a day (BID) | ORAL | 0 refills | Status: DC | PRN

## 2022-01-07 NOTE — Telephone Encounter (Signed)
Patient calling back for refill on hydrocodone. Please see note below.

## 2022-01-12 ENCOUNTER — Other Ambulatory Visit: Payer: Self-pay

## 2022-01-12 ENCOUNTER — Other Ambulatory Visit (HOSPITAL_COMMUNITY): Payer: Self-pay | Admitting: *Deleted

## 2022-01-12 MED ORDER — POTASSIUM CHLORIDE CRYS ER 10 MEQ PO TBCR: 10.0000 meq | EXTENDED_RELEASE_TABLET | Freq: Every day | ORAL | 0 refills | Status: DC

## 2022-01-12 MED ORDER — RIVAROXABAN 20 MG PO TABS: 20.0000 mg | ORAL_TABLET | Freq: Every day | ORAL | 11 refills | Status: DC

## 2022-01-12 NOTE — Telephone Encounter (Signed)
Call to patient to see if she has picked up refills on the Prilosec that was sent in on 01/01/2022.  Message was left for patient to call the Clinics.  Attempts were made to contact the pharmacy.  Unable to reach pharmacist  to ask about refills in a timely manner 15 min. Hold.

## 2022-01-12 NOTE — Telephone Encounter (Signed)
Patient requesting rx refill for acid reflux med.

## 2022-01-13 ENCOUNTER — Encounter (HOSPITAL_COMMUNITY): Payer: Self-pay

## 2022-01-13 ENCOUNTER — Emergency Department (HOSPITAL_COMMUNITY)
Admission: EM | Admit: 2022-01-13 | Discharge: 2022-01-13 | Disposition: A | Discharge status: Home / Self Care | Payer: Medicare Other | Attending: Emergency Medicine | Admitting: Emergency Medicine

## 2022-01-13 ENCOUNTER — Other Ambulatory Visit: Payer: Self-pay

## 2022-01-13 ENCOUNTER — Emergency Department (HOSPITAL_COMMUNITY): Payer: Medicare Other

## 2022-01-13 DIAGNOSIS — Z7951 Long term (current) use of inhaled steroids: Secondary | ICD-10-CM | POA: Diagnosis not present

## 2022-01-13 DIAGNOSIS — I10 Essential (primary) hypertension: Secondary | ICD-10-CM | POA: Diagnosis not present

## 2022-01-13 DIAGNOSIS — Z87891 Personal history of nicotine dependence: Secondary | ICD-10-CM | POA: Insufficient documentation

## 2022-01-13 DIAGNOSIS — I7143 Infrarenal abdominal aortic aneurysm, without rupture: Secondary | ICD-10-CM | POA: Insufficient documentation

## 2022-01-13 DIAGNOSIS — J4520 Mild intermittent asthma, uncomplicated: Secondary | ICD-10-CM | POA: Insufficient documentation

## 2022-01-13 DIAGNOSIS — I4891 Unspecified atrial fibrillation: Secondary | ICD-10-CM | POA: Insufficient documentation

## 2022-01-13 DIAGNOSIS — Z79899 Other long term (current) drug therapy: Secondary | ICD-10-CM | POA: Diagnosis not present

## 2022-01-13 DIAGNOSIS — R109 Unspecified abdominal pain: Secondary | ICD-10-CM | POA: Diagnosis not present

## 2022-01-13 DIAGNOSIS — R1013 Epigastric pain: Secondary | ICD-10-CM | POA: Diagnosis not present

## 2022-01-13 DIAGNOSIS — R0602 Shortness of breath: Secondary | ICD-10-CM | POA: Diagnosis not present

## 2022-01-13 DIAGNOSIS — Z7901 Long term (current) use of anticoagulants: Secondary | ICD-10-CM | POA: Insufficient documentation

## 2022-01-13 DIAGNOSIS — I714 Abdominal aortic aneurysm, without rupture, unspecified: Secondary | ICD-10-CM | POA: Diagnosis not present

## 2022-01-13 DIAGNOSIS — I700 Atherosclerosis of aorta: Secondary | ICD-10-CM | POA: Diagnosis not present

## 2022-01-13 LAB — CBC WITH DIFFERENTIAL/PLATELET
Abs Immature Granulocytes: 0 10*3/uL (ref 0.00–0.07)
Basophils Absolute: 0 10*3/uL (ref 0.0–0.1)
Basophils Relative: 1 %
Eosinophils Absolute: 0.3 10*3/uL (ref 0.0–0.5)
Eosinophils Relative: 10 %
HCT: 34.4 % — ABNORMAL LOW (ref 36.0–46.0)
Hemoglobin: 11.2 g/dL — ABNORMAL LOW (ref 12.0–15.0)
Immature Granulocytes: 0 %
Lymphocytes Relative: 34 %
Lymphs Abs: 1.1 10*3/uL (ref 0.7–4.0)
MCH: 25.7 pg — ABNORMAL LOW (ref 26.0–34.0)
MCHC: 32.6 g/dL (ref 30.0–36.0)
MCV: 78.9 fL — ABNORMAL LOW (ref 80.0–100.0)
Monocytes Absolute: 0.3 10*3/uL (ref 0.1–1.0)
Monocytes Relative: 11 %
Neutro Abs: 1.5 10*3/uL — ABNORMAL LOW (ref 1.7–7.7)
Neutrophils Relative %: 44 %
Platelets: 267 10*3/uL (ref 150–400)
RBC: 4.36 MIL/uL (ref 3.87–5.11)
RDW: 15.8 % — ABNORMAL HIGH (ref 11.5–15.5)
WBC: 3.2 10*3/uL — ABNORMAL LOW (ref 4.0–10.5)
nRBC: 0 % (ref 0.0–0.2)

## 2022-01-13 LAB — COMPREHENSIVE METABOLIC PANEL
ALT: 22 U/L (ref 0–44)
AST: 23 U/L (ref 15–41)
Albumin: 4.3 g/dL (ref 3.5–5.0)
Alkaline Phosphatase: 59 U/L (ref 38–126)
Anion gap: 6 (ref 5–15)
BUN: 12 mg/dL (ref 8–23)
CO2: 26 mmol/L (ref 22–32)
Calcium: 9.7 mg/dL (ref 8.9–10.3)
Chloride: 108 mmol/L (ref 98–111)
Creatinine, Ser: 0.78 mg/dL (ref 0.44–1.00)
GFR, Estimated: >60 mL/min (ref >60)
Glucose, Bld: 95 mg/dL (ref 70–99)
Potassium: 3.6 mmol/L (ref 3.5–5.1)
Sodium: 140 mmol/L (ref 135–145)
Total Bilirubin: 0.3 mg/dL (ref 0.3–1.2)
Total Protein: 8.3 g/dL — ABNORMAL HIGH (ref 6.5–8.1)

## 2022-01-13 LAB — URINALYSIS, ROUTINE W REFLEX MICROSCOPIC
Bilirubin Urine: NEGATIVE
Glucose, UA: NEGATIVE mg/dL
Hgb urine dipstick: NEGATIVE
Ketones, ur: NEGATIVE mg/dL
Leukocytes,Ua: NEGATIVE
Nitrite: NEGATIVE
Protein, ur: NEGATIVE mg/dL
Specific Gravity, Urine: 1.011 (ref 1.005–1.030)
pH: 7 (ref 5.0–8.0)

## 2022-01-13 LAB — TROPONIN I (HIGH SENSITIVITY)
Troponin I (High Sensitivity): 4 ng/L (ref <18)
Troponin I (High Sensitivity): 4 ng/L (ref <18)

## 2022-01-13 LAB — BRAIN NATRIURETIC PEPTIDE: B Natriuretic Peptide: 29.3 pg/mL (ref 0.0–100.0)

## 2022-01-13 LAB — TSH: TSH: 1.522 u[IU]/mL (ref 0.350–4.500)

## 2022-01-13 LAB — D-DIMER, QUANTITATIVE: D-Dimer, Quant: <0.27 ug/mL-FEU (ref 0.00–0.50)

## 2022-01-13 LAB — LIPASE, BLOOD: Lipase: 30 U/L (ref 11–51)

## 2022-01-13 MED ORDER — SODIUM CHLORIDE 0.9 % IV BOLUS
1000.0000 mL | Freq: Once | INTRAVENOUS | Status: AC
  Administered 2022-01-13: 1000 mL via INTRAVENOUS

## 2022-01-13 MED ORDER — ONDANSETRON HCL 4 MG/2ML IJ SOLN
4.0000 mg | Freq: Once | INTRAMUSCULAR | Status: AC
Start: 2022-01-13 — End: 2022-01-13
  Administered 2022-01-13: 4 mg via INTRAVENOUS
  Filled 2022-01-13: qty 2

## 2022-01-13 MED ORDER — SUCRALFATE 1 G PO TABS: 1.0000 g | ORAL_TABLET | Freq: Three times a day (TID) | ORAL | 0 refills | Status: DC

## 2022-01-13 MED ORDER — IOHEXOL 300 MG/ML  SOLN
100.0000 mL | Freq: Once | INTRAMUSCULAR | Status: AC | PRN
Start: 2022-01-13 — End: 2022-01-13
  Administered 2022-01-13: 100 mL via INTRAVENOUS

## 2022-01-13 MED ORDER — DICYCLOMINE HCL 10 MG PO CAPS
10.0000 mg | ORAL_CAPSULE | Freq: Once | ORAL | Status: AC
  Administered 2022-01-13: 10 mg via ORAL
  Filled 2022-01-13: qty 1

## 2022-01-13 MED ORDER — FAMOTIDINE IN NACL 20-0.9 MG/50ML-% IV SOLN
20.0000 mg | Freq: Once | INTRAVENOUS | Status: AC
  Administered 2022-01-13: 20 mg via INTRAVENOUS
  Filled 2022-01-13: qty 50

## 2022-01-13 MED ORDER — ALUM & MAG HYDROXIDE-SIMETH 200-200-20 MG/5ML PO SUSP
15.0000 mL | Freq: Once | ORAL | Status: AC
  Administered 2022-01-13: 15 mL via ORAL
  Filled 2022-01-13: qty 30

## 2022-01-13 MED ORDER — DICYCLOMINE HCL 20 MG PO TABS: 10.0000 mg | ORAL_TABLET | Freq: Two times a day (BID) | ORAL | 0 refills | Status: DC | PRN

## 2022-01-13 NOTE — ED Notes (Signed)
Patient transported to X-ray 

## 2022-01-13 NOTE — ED Triage Notes (Signed)
Pt reports SHOB, palpitations, and and increased pain to left rib cage area that radiates to back. Hx of a fib.

## 2022-01-13 NOTE — Discharge Instructions (Addendum)
Please follow-up with PCP to arrange for ultrasound in 5 years of your abdominal aorta   There are many causes of abdominal pain. Most pain is not serious and goes away, but some pain gets worse, changes, or will not go away. Please return to the emergency department or see your doctor right away if you (or your family member) experience any of the following:  1. Pain that gets worse or moves to just one spot.  2. Pain that gets worse if you cough or sneeze.  3. Pain with going over a bump in the road.  4. Pain that does not get better in 24 hours.  5. Inability to keep down liquids (vomiting)-especially if you are making less urine.  6. Fainting.  7. Blood in the vomit or stool.  8. High fever or shaking chills.  9. Swelling of the abdomen.  10. Any new or worsening problem.      Follow-up Instructions  See your primary care provider if not completely better in the next 2-3 days. Come to the ED if you are unable to see them in this time frame.    Additional Instructions  No alcohol.  No caffeine, aspirin, or cigarettes.   Please return to the emergency department immediately for any new or concerning symptoms, or if you get worse.

## 2022-01-13 NOTE — ED Provider Notes (Signed)
[facility] DEPT Provider Note   CSN: [id number] Arrival date & time: 01/13/22  3545     History  Chief Complaint  Patient presents with   Shortness of Breath   Palpitations    [name] is a 71 y.o. female.   Patient as above with significant medical history as below, including atrial fibrillation on Eliquis, asthma, hypertension who presents to the ED with  a myriad of complaints.  Patient reports she has been having some epigastric and left-sided flank burning, sharp stabbing pain.  This has been ongoing for 2 to 3 days.  Having some mild nausea associate with this.  No change to p.o. intake.  No recent medication changes.  She has not been taking antacid medication for the symptoms.  She is concerned that her acid reflux is flaring up.  Worse when she strains for bowel movement.  Patient also reports having some chest pressure and palpitations since around 730-8:00 this morning.  Symptoms have since then resolved.  She is compliant with her DOAC.     Past Medical History:  Diagnosis Date   Asthma in adult, mild intermittent, uncomplicated    Chronic anticoagulation    Chronic constipation    Eczema    GERD (gastroesophageal reflux disease)    History of kidney stones    Hx of vaginal bleeding, resolved, ultrasound completed 10/11/2018   Hypertension    Personal history of atrial fibrillation, s/p ablation, now in NSR but remains on anticoaulation 11/26/2015   Followed in cardiology clinic     Past Surgical History:  Procedure Laterality Date   ATRIAL FIBRILLATION ABLATION N/A 09/30/2017   Procedure: ATRIAL FIBRILLATION ABLATION;  Surgeon: Thompson Grayer, MD;  Location: [facility];  Service: Cardiovascular;  Laterality: N/A;   BIOPSY  09/24/2021   Procedure: BIOPSY;  Surgeon: Sharyn Creamer, MD;  Location: [facility];  Service: Gastroenterology;;   CHOLECYSTECTOMY N/A 12/08/2018   Procedure: LAPAROSCOPIC CHOLECYSTECTOMY WITH  INTRAOPERATIVE CHOLANGIOGRAM;  Surgeon: Jovita Kussmaul, MD;  Location: [facility];  Service: General;  Laterality: N/A;   COLONOSCOPY WITH PROPOFOL N/A 09/24/2021   Procedure: COLONOSCOPY WITH PROPOFOL;  Surgeon: Sharyn Creamer, MD;  Location: [facility];  Service: Gastroenterology;  Laterality: N/A;   ERCP N/A 12/09/2018   Procedure: ENDOSCOPIC RETROGRADE CHOLANGIOPANCREATOGRAPHY (ERCP);  Surgeon: Carol Ada, MD;  Location: [facility];  Service: Endoscopy;  Laterality: N/A;   ESOPHAGOGASTRODUODENOSCOPY (EGD) WITH PROPOFOL N/A 09/24/2021   Procedure: ESOPHAGOGASTRODUODENOSCOPY (EGD) WITH PROPOFOL;  Surgeon: Sharyn Creamer, MD;  Location: [facility];  Service: Gastroenterology;  Laterality: N/A;   POLYPECTOMY  09/24/2021   Procedure: POLYPECTOMY;  Surgeon: Sharyn Creamer, MD;  Location: [facility];  Service: Gastroenterology;;   Joan Mayans  12/09/2018   Procedure: Joan Mayans;  Surgeon: Carol Ada, MD;  Location: [facility];  Service: Endoscopy;;   TONSILLECTOMY     TUBAL LIGATION       The history is provided by the patient. No language interpreter was used.  Shortness of Breath Associated symptoms: abdominal pain and chest pain   Associated symptoms: no cough, no fever, no headaches, no rash and no vomiting   Palpitations Associated symptoms: chest pain, nausea and shortness of breath   Associated symptoms: no cough and no vomiting       Home Medications Prior to Admission medications   Medication Sig Start Date End Date Taking? Authorizing Provider  dicyclomine (BENTYL) 20 MG tablet Take 0.5 tablets (10 mg total) by mouth 2 (  two) times daily as needed for spasms. 01/13/22  Yes Wynona Dove A, DO  sucralfate (CARAFATE) 1 g tablet Take 1 tablet (1 g total) by mouth 4 (four) times daily -  with meals and at bedtime for 7 days. 01/13/22 01/20/22 Yes Jeanell Sparrow, DO  alendronate (FOSAMAX) 70 MG tablet Take 1 tablet (70 mg total) by mouth every 7 (seven) days. Take with a full  glass of water on an empty stomach. 06/09/21 06/09/22  Angelica Pou, MD  Amoxicill-Rifabutin-Omeprazole (TALICIA PO) Take 4 tablets by mouth every 8 (eight) hours.    [provider]  atorvastatin (LIPITOR) 40 MG tablet Take 1 tablet (40 mg total) by mouth daily. 08/27/21 08/27/22  Angelica Pou, MD  cetirizine (ZYRTEC) 10 MG tablet Take 10 mg by mouth daily.    [provider]  dicyclomine (BENTYL) 10 MG capsule Take 1 capsule (10 mg total) by mouth every 8 (eight) hours as needed (abdominal pain). 12/07/21 01/06/22  Armbruster, Carlota Raspberry, MD  diltiazem (CARDIZEM) 60 MG tablet Take 1 tablet (60 mg total) by mouth every 6 (six) hours as needed (afib). 08/14/21   Sherran Needs, NP  diltiazem (CARTIA XT) 120 MG 24 hr capsule Take 1 capsule (120 mg total) by mouth 2 (two) times daily. 09/18/21   Sherran Needs, NP  docusate sodium (COLACE) 100 MG capsule Take 1 capsule (100 mg total) by mouth daily. Patient taking differently: Take 100 mg by mouth daily as needed for mild constipation. 06/15/21   Nuala Alpha A, PA-C  ezetimibe (ZETIA) 10 MG tablet Take 1 tablet (10 mg total) by mouth daily. 12/07/21 12/07/22  Angelica Pou, MD  fluticasone (FLOVENT HFA) 44 MCG/ACT inhaler Inhale 1 puff into the lungs daily. 09/08/21   Angelica Pou, MD  HYDROcodone-acetaminophen (NORCO/VICODIN) 5-325 MG tablet Take 1 tablet by mouth 2 (two) times daily as needed for severe pain. 01/07/22 02/06/22  Angelica Pou, MD  levalbuterol [facility] HFA) 45 MCG/ACT inhaler INHALE 1 PUFF INTO THE LUNGS EVERY 6 HOURS AS NEEDED FOR SHORTNESS OF BREATH Patient taking differently: Inhale 1 puff into the lungs every 6 (six) hours as needed for shortness of breath. 09/14/20   Katsadouros, Vasilios, MD  losartan (COZAAR) 50 MG tablet Take 2 tablets (100 mg total) by mouth daily. 08/27/21 01/09/22  Angelica Pou, MD  montelukast (SINGULAIR) 10 MG tablet Take 1 tablet (10 mg total) by  mouth at bedtime. 09/19/20   Angelica Pou, MD  omeprazole (PRILOSEC) 40 MG capsule Take 1 capsule (40 mg total) by mouth in the morning and at bedtime. 01/01/22   Iona Beard, MD  polyethylene glycol (MIRALAX / GLYCOLAX) 17 g packet Take 17 g by mouth 2 (two) times daily. 09/29/21   Timothy Lasso, MD  potassium chloride (KLOR-CON M) 10 MEQ tablet Take 1 tablet (10 mEq total) by mouth daily. 01/12/22   Angelica Pou, MD  rivaroxaban (XARELTO) 20 MG TABS tablet Take 1 tablet (20 mg total) by mouth daily with supper. 01/12/22   Sherran Needs, NP  simethicone (GAS-X) 80 MG chewable tablet Chew 1 tablet (80 mg total) by mouth 4 (four) times daily as needed for up to 7 days for flatulence. 01/01/22 01/08/22  Iona Beard, MD  tiotropium (SPIRIVA) 18 MCG inhalation capsule Place 1 capsule (18 mcg total) into inhaler and inhale daily. 10/16/21   Angelica Pou, MD      Allergies    Albuterol, Percocet [oxycodone-acetaminophen],  Celecoxib, and Tramadol    Review of Systems   Review of Systems  Constitutional:  Positive for fatigue. Negative for chills and fever.  HENT:  Negative for facial swelling and trouble swallowing.   Eyes:  Negative for photophobia and visual disturbance.  Respiratory:  Positive for shortness of breath. Negative for cough.   Cardiovascular:  Positive for chest pain and palpitations.  Gastrointestinal:  Positive for abdominal pain and nausea. Negative for vomiting.  Endocrine: Negative for polydipsia and polyuria.  Genitourinary:  Negative for difficulty urinating and hematuria.  Musculoskeletal:  Negative for gait problem and joint swelling.  Skin:  Negative for pallor and rash.  Neurological:  Negative for syncope and headaches.  Psychiatric/Behavioral:  Negative for agitation and confusion.    Physical Exam Updated Vital Signs BP (!) 149/81   Pulse 67   Temp 97.7 F (36.5 C) (Oral)   Resp (!) 23   Ht _0  (1.702 m)   Wt 61.2 kg   SpO2 98%    BMI 21.14 kg/m  Physical Exam Vitals and nursing note reviewed.  Constitutional:      General: She is not in acute distress.    Appearance: Normal appearance. She is well-developed. She is not ill-appearing or diaphoretic.  HENT:     Head: Normocephalic and atraumatic.     Right Ear: External ear normal.     Left Ear: External ear normal.     Nose: Nose normal.     Mouth/Throat:     Mouth: Mucous membranes are moist.  Eyes:     General: No scleral icterus.       Right eye: No discharge.        Left eye: No discharge.  Cardiovascular:     Rate and Rhythm: Normal rate and regular rhythm.     Pulses: Normal pulses.     Heart sounds: Normal heart sounds.  Pulmonary:     Effort: Pulmonary effort is normal. No tachypnea or respiratory distress.     Breath sounds: Normal breath sounds. No decreased breath sounds or wheezing.  Abdominal:     General: Abdomen is flat.     Tenderness: There is no abdominal tenderness.  Musculoskeletal:        General: Normal range of motion.     Cervical back: Normal range of motion.     Right lower leg: No edema.     Left lower leg: No edema.  Skin:    General: Skin is warm and dry.     Capillary Refill: Capillary refill takes less than 2 seconds.  Neurological:     Mental Status: She is alert and oriented to person, place, and time.     GCS: GCS eye subscore is 4. GCS verbal subscore is 5. GCS motor subscore is 6.  Psychiatric:        Mood and Affect: Mood normal.        Behavior: Behavior normal.    ED Results / Procedures / Treatments   Labs (all labs ordered are listed, but only abnormal results are displayed) Labs Reviewed  COMPREHENSIVE METABOLIC PANEL - Abnormal; Notable for the following components:      Result Value   Total Protein 8.3 (*)    All other components within normal limits  CBC WITH DIFFERENTIAL/PLATELET - Abnormal; Notable for the following components:   WBC 3.2 (*)    Hemoglobin 11.2 (*)    HCT 34.4 (*)    MCV  78.9 (*)  MCH 25.7 (*)    RDW 15.8 (*)    Neutro Abs 1.5 (*)    All other components within normal limits  BRAIN NATRIURETIC PEPTIDE  LIPASE, BLOOD  TSH  URINALYSIS, ROUTINE W REFLEX MICROSCOPIC  D-DIMER, QUANTITATIVE (NOT AT [facility])  TROPONIN I (HIGH SENSITIVITY)  TROPONIN I (HIGH SENSITIVITY)    EKG EKG Interpretation  Date/Time:  Wednesday Jan 13 2022 08:42:38 EDT Ventricular Rate:  79 PR Interval:  115 QRS Duration: 86 QT Interval:  390 QTC Calculation: 448 R Axis:   55 Text Interpretation: Sinus rhythm Borderline short PR interval RSR' in V1 or V2, probably normal variant Similar to prior tracing 3/23, no STEMI Confirmed by Wynona Dove ([id number]) on 01/13/2022 9:18:30 AM  Radiology DG Chest 2 View  Result Date: 01/13/2022 CLINICAL DATA:  Shortness of breath. EXAM: CHEST - 2 VIEW COMPARISON:  10/28/2021 FINDINGS: Lungs are hyperexpanded. Cardiopericardial silhouette is at upper limits of normal for size. The lungs are clear without focal pneumonia, edema, pneumothorax or pleural effusion. The visualized bony structures of the thorax are unremarkable. Telemetry leads overlie the chest. IMPRESSION: No active cardiopulmonary disease. Electronically Signed   By: Misty Stanley M.D.   On: 01/13/2022 09:42   CT ABDOMEN PELVIS W CONTRAST  Result Date: 01/13/2022 CLINICAL DATA:  Left upper quadrant abdominal pain EXAM: CT ABDOMEN AND PELVIS WITH CONTRAST TECHNIQUE: Multidetector CT imaging of the abdomen and pelvis was performed using the standard protocol following bolus administration of intravenous contrast. RADIATION DOSE REDUCTION: This exam was performed according to the departmental dose-optimization program which includes automated exposure control, adjustment of the mA and/or kV according to patient size and/or use of iterative reconstruction technique. CONTRAST:  161m OMNIPAQUE IOHEXOL 300 MG/ML  SOLN COMPARISON:  CT abdomen and pelvis dated September 27, 2021; CT abdomen and pelvis  dated Jan 11, 2018 FINDINGS: Lower chest: Solid pulmonary nodule in the right lower lobe measuring 5 mm in mean diameter on series 7, image 10, stable in size when compared with prior CT dated Jan 11, 2018 and likely benign. Hepatobiliary: Unchanged scattered low-attenuation liver lesions which are too small to completely characterize but likely simple cysts. No suspicious liver lesions. Gallbladder is surgically absent. Prominent common bile duct which is unchanged when compared with prior exam and not unexpected status post cholecystectomy. Pancreas: Unchanged prominence of the main pancreatic duct at the level of the pancreatic head. No surrounding inflammatory change. Spleen: Normal in size without focal abnormality. Adrenals/Urinary Tract: Bilateral adrenal glands are unremarkable. Kidneys and symmetrically with no evidence of hydronephrosis or nephrolithiasis. Bladder is unremarkable. Stomach/Bowel: Stomach is within normal limits. Appendix is not visualized and is likely surgically absent. No evidence of bowel wall thickening, distention, or inflammatory changes. Vascular/Lymphatic: Aortic atherosclerosis. Dilated infrarenal abdominal aorta, measuring up to 2.3 cm. No enlarged abdominal or pelvic lymph nodes. Reproductive: Uterus and bilateral adnexa are unremarkable. Other: No abdominal wall hernia or abnormality. No abdominopelvic ascites. Musculoskeletal: No acute or significant osseous findings. IMPRESSION: 1. No acute findings in the abdomen or pelvis. 2.  Aortic Atherosclerosis (ICD10-I70.0). 3. Dilated infrarenal abdominal aorta, measuring up to 2.3 cm. Recommend follow-up ultrasound every 5 years. This recommendation follows ACR consensus guidelines: White Paper of the ACR Incidental Findings Committee II on Vascular Findings. J Am Coll Radiol 2013; 10:789-794. Electronically Signed   By: LYetta GlassmanM.D.   On: 01/13/2022 13:25    Procedures Procedures    Medications Ordered in  ED Medications  alum & mag hydroxide-simeth (  MAALOX/MYLANTA) 200-200-20 MG/5ML suspension 15 mL (15 mLs Oral Given 01/13/22 0915)  famotidine (PEPCID) IVPB 20 mg premix (0 mg Intravenous Stopped 01/13/22 1035)  sodium chloride 0.9 % bolus 1,000 mL (0 mLs Intravenous Stopped 01/13/22 1149)  dicyclomine (BENTYL) capsule 10 mg (10 mg Oral Given 01/13/22 0916)  ondansetron (ZOFRAN) injection 4 mg (4 mg Intravenous Given 01/13/22 0915)  iohexol (OMNIPAQUE) 300 MG/ML solution 100 mL (100 mLs Intravenous Contrast Given 01/13/22 1243)    ED Course/ Medical Decision Making/ A&P Clinical Course as of 01/13/22 [place] Jan 13, 2022  1348 3. Dilated infrarenal abdominal aorta, measuring up to 2.3 cm. Recommend follow-up ultrasound every 5 years. This recommendation follows ACR consensus guidelines: White Paper of the ACR Incidental Findings Committee II on Vascular Findings. J Am Coll Radiol 2013; 10:789-794.   [SG]    Clinical Course User Index [SG] Jeanell Sparrow, DO                           Medical Decision Making Amount and/or Complexity of Data Reviewed Labs: ordered. Radiology: ordered.  Risk OTC drugs. Prescription drug management.    CC: Chest pain, palpitations, abdominal pain,  This patient presents to the Emergency Department for the above complaint. This involves an extensive number of treatment options and is a complaint that carries with it a high risk of complications and morbidity. Vital signs were reviewed. Serious etiologies considered.  Differential includes all life-threatening causes for chest pain. This includes but is not exclusive to acute coronary syndrome, aortic dissection, pulmonary embolism, cardiac tamponade, community-acquired pneumonia, pericarditis, musculoskeletal chest wall pain, gastritis, enteritis, biliary colic, renal colic, UTI etc.   Record review:  Previous records obtained and reviewed  ED visits, prior labs and imaging, medication  history  Additional history obtained from N/A  Medical and surgical history as noted above.   Work up as above, notable for:  Labs & imaging results that were available during my care of the patient were visualized by me and considered in my medical decision making.   I ordered imaging studies which included chest x-ray, CT abdomen pelvis with IV contrast. I visualized the imaging, interpreted images, and I agree with radiologist interpretation.  Infrarenal abdominal aortic aneurysm noted, no rupture.  Otherwise no acute changes  Cardiac monitoring reviewed and interpreted personally which shows NSR  Management: Give GI cocktail, IV fluids. Patient has no chest pain at this time, will hold off on aspirin.  Reassessment:  Patient reports her symptoms have resolved entirely.  She was able to tolerate p.o. intake without difficulty.  D-dimer is negative, low risk Wells score.  PE is unlikely.  She has no ongoing chest pain, troponin negative x2, ECG without acute ischemic changes.  Chest x-ray negative.  NSR on telemetry.  Favor likely atypical chest pain.  I have very low suspicion for ACS.  The patient's overall condition has improved, the patient presents with abdominal pain without signs of peritonitis, or other life-threatening serious etiology. The patient understands that at this time there is no evidence for a more malignant underlying process, but the patient also understands that early in the process of an illness, an emergency department workup can be falsely reassuring. Detailed discussions were had with the patient regarding current findings, and need for close f/u with PCP or on call doctor. The patient appears stable for discharge and has been instructed to return immediately if the symptoms worsen in any way  for re-evaluation. Patient verbalized understanding and is in agreement with current care plan.  All questions answered prior to  discharge.                  Social determinants of health include -  Social History   Socioeconomic History   Marital status: Single    Spouse name: Not on file   Number of children: Not on file   Years of education: Not on file   Highest education level: Not on file  Occupational History   Occupation: Retired  Tobacco Use   Smoking status: Former    Types: Cigarettes    Quit date: 10/24/2015    Years since quitting: 6.2   Smokeless tobacco: Never  Vaping Use   Vaping Use: Never used  Substance and Sexual Activity   Alcohol use: Not Currently   Drug use: Not Currently    Types: Marijuana    Comment: Smoked marijuana in the past.   Sexual activity: Yes    Partners: Male    Birth control/protection: Post-menopausal  Other Topics Concern   Not on file  Social History Narrative   Lives alone in [place], [place] and has 2 daughters who are within driving distance   Enjoys watching soap operas, reading, exercise   Social Determinants of Health   Financial Resource Strain: Not on file  Food Insecurity: Not on file  Transportation Needs: Not on file  Physical Activity: Not on file  Stress: Not on file  Social Connections: Not on file  Intimate Partner Violence: Not on file      This chart was dictated using Armed forces training and education officer.  Despite best efforts to proofread,  errors can occur which can change the documentation meaning.         Final Clinical Impression(s) / ED Diagnoses Final diagnoses:  Epigastric pain  Infrarenal abdominal aortic aneurysm (AAA) without rupture (Startex)    Rx / DC Orders ED Discharge Orders          Ordered    sucralfate (CARAFATE) 1 g tablet  3 times daily with meals & bedtime        01/13/22 1433    dicyclomine (BENTYL) 20 MG tablet  2 times daily PRN        01/13/22 1433              Jeanell Sparrow, DO 01/13/22 1437

## 2022-01-14 ENCOUNTER — Ambulatory Visit (HOSPITAL_COMMUNITY): Payer: Medicare Other | Attending: Internal Medicine

## 2022-01-20 ENCOUNTER — Encounter: Payer: Self-pay | Admitting: Internal Medicine

## 2022-01-21 ENCOUNTER — Encounter: Payer: Self-pay | Admitting: Internal Medicine

## 2022-01-21 ENCOUNTER — Telehealth: Payer: Self-pay

## 2022-01-21 ENCOUNTER — Ambulatory Visit (INDEPENDENT_AMBULATORY_CARE_PROVIDER_SITE_OTHER): Payer: Medicare Other | Admitting: Internal Medicine

## 2022-01-21 VITALS — BP 131/75 | HR 73 | Temp 98.2°F | Ht 66.0 in | Wt 132.3 lb

## 2022-01-21 DIAGNOSIS — K5909 Other constipation: Secondary | ICD-10-CM

## 2022-01-21 DIAGNOSIS — R1031 Right lower quadrant pain: Secondary | ICD-10-CM

## 2022-01-21 DIAGNOSIS — Z87891 Personal history of nicotine dependence: Secondary | ICD-10-CM | POA: Diagnosis not present

## 2022-01-21 DIAGNOSIS — G8929 Other chronic pain: Secondary | ICD-10-CM | POA: Diagnosis not present

## 2022-01-21 DIAGNOSIS — A048 Other specified bacterial intestinal infections: Secondary | ICD-10-CM | POA: Diagnosis not present

## 2022-01-21 DIAGNOSIS — M810 Age-related osteoporosis without current pathological fracture: Secondary | ICD-10-CM | POA: Diagnosis not present

## 2022-01-21 DIAGNOSIS — I10 Essential (primary) hypertension: Secondary | ICD-10-CM

## 2022-01-21 DIAGNOSIS — Z8679 Personal history of other diseases of the circulatory system: Secondary | ICD-10-CM

## 2022-01-21 DIAGNOSIS — R1032 Left lower quadrant pain: Secondary | ICD-10-CM

## 2022-01-21 DIAGNOSIS — Z1231 Encounter for screening mammogram for malignant neoplasm of breast: Secondary | ICD-10-CM

## 2022-01-21 DIAGNOSIS — K219 Gastro-esophageal reflux disease without esophagitis: Secondary | ICD-10-CM | POA: Diagnosis not present

## 2022-01-21 DIAGNOSIS — Z8639 Personal history of other endocrine, nutritional and metabolic disease: Secondary | ICD-10-CM | POA: Diagnosis not present

## 2022-01-21 MED ORDER — DICYCLOMINE HCL 10 MG PO CAPS: 10.0000 mg | ORAL_CAPSULE | Freq: Three times a day (TID) | ORAL | 0 refills | Status: DC | PRN

## 2022-01-21 NOTE — Assessment & Plan Note (Signed)
Remains in normal sinus rhythm on exam.  Occasional palpitations for which she takes as needed Cardizem as ordered by cardiology.  Adherent to anticoagulation with no complications.

## 2022-01-21 NOTE — Telephone Encounter (Signed)
RTC to patient states had discussed getting another medication for her Acid Relux.  Wants Dr. Jimmye Norman to let her know what other prescription she can get .

## 2022-01-21 NOTE — Assessment & Plan Note (Signed)
Ongoing problem.  She tends to get sharp pains in sides and flanks, possibly associated with distention at the hepatic and splenic flexures.  She has considerable flatus and upper GI gas and finds relief after this is passed.  Takes Gas-X.  Has had trips to the emergency room for this, as she did recently- extensive work-up including abdominal CT were normal.  She is reassured.  She will be taking newly prescribed Bentyl as needed.  Advised that she can lie in the bed when experiencing gaseous extension and rock from side to side as well as try to walk to facilitate gas passage.  She understands that her opioid pain medicine can contribute to abdominal pain due to constipation and very infrequently takes it.

## 2022-01-21 NOTE — Assessment & Plan Note (Signed)
Well-controlled on current regimen which we will continue.  She has not needed the spironolactone which was prescribed sometime ago and subsequently discontinued due to hypotension.

## 2022-01-21 NOTE — Assessment & Plan Note (Signed)
Continues KCl 10 millequivalents daily.  Recent BMP reviewed.  No adjustment needed.

## 2022-01-21 NOTE — Assessment & Plan Note (Signed)
[name] identifies belching as GERD.  The majority of her epigastric pain has resolved with H. pylori treatment.  She does not wish to take Carafate, as it causes nausea- I disposed of her remaining tablets.  She does not find the twice daily H2B to be as effective as a PPI and wishes to resume the PPI.

## 2022-01-21 NOTE — Patient Instructions (Signed)
Ms. Iacovelli, I'm so glad you're feeling better!  It sounds like much of your abdominal and side/back pain is coming from cramps (gas, bowel movement).  1)  Try to reduce your miralax to once or twice a day.  As long as you are having comfortable bowel movements, you don't have to take more than what is necessary.  2)  When you feel like you are having gas cramps, try rolling on your sides and on your back, and try taking a dicyclomine tablet which helps with bowel cramps.  3)  You don't have to take the sucralfate and I am discarding it.    4)  For now, you don't need the spironolactone for your blood pressure.  I ordered your mammogram.  Take your honey out for some longer walks for exercise and overall health.  See you in 3 months!  Dr. Jimmye Norman

## 2022-01-21 NOTE — Progress Notes (Signed)
Med review -  H. Pylori has been treated - had a f/u stool sample, no H. Pylori detected.  Still ezetimibe Started (but hasn't tried) the dicycolmine for colon crampy pain.    Has added pepcid to her PPI for GERD - not working as well as her PPI.  Bowels are moving now, usually daily.  TAking 2 caps of miralax bid, which she thinks could be caisomg cramping.    Has lots of gas.  Feels much better after t a BM.  Meds reviewed; she is taking the following -   Current Outpatient Medications:    alendronate (FOSAMAX) 70 MG tablet, Take 1 tablet (70 mg total) by mouth every 7 (seven) days. Take with a full glass of water on an empty stomach., Disp: 12 tablet, Rfl: 3   cetirizine (ZYRTEC) 10 MG tablet, Take 10 mg by mouth daily., Disp: , Rfl:    diltiazem (CARDIZEM) 60 MG tablet, Take 1 tablet (60 mg total) by mouth every 6 (six) hours as needed (afib)., Disp: 45 tablet, Rfl: 1   diltiazem (CARTIA XT) 120 MG 24 hr capsule, Take 1 capsule (120 mg total) by mouth 2 (two) times daily., Disp: 180 capsule, Rfl: 2   ezetimibe (ZETIA) 10 MG tablet, Take 1 tablet (10 mg total) by mouth daily., Disp: 30 tablet, Rfl: 11   losartan (COZAAR) 50 MG tablet, Take 2 tablets (100 mg total) by mouth daily., Disp: 135 tablet, Rfl: 3   polyethylene glycol (MIRALAX / GLYCOLAX) 17 g packet, Take 17 g by mouth 2 (two) times daily., Disp: 14 each, Rfl: 0   potassium chloride (KLOR-CON M) 10 MEQ tablet, Take 1 tablet (10 mEq total) by mouth daily., Disp: 60 tablet, Rfl: 0   rivaroxaban (XARELTO) 20 MG TABS tablet, Take 1 tablet (20 mg total) by mouth daily with supper., Disp: 30 tablet, Rfl: 11   simethicone (GAS-X) 80 MG chewable tablet, Chew 1 tablet (80 mg total) by mouth 4 (four) times daily as needed for up to 7 days for flatulence., Disp: 28 tablet, Rfl: 0   atorvastatin (LIPITOR) 40 MG tablet, Take 1 tablet (40 mg total) by mouth daily., Disp: 90 tablet, Rfl: 3   dicyclomine (BENTYL) 10 MG capsule, Take 1 capsule  (10 mg total) by mouth every 8 (eight) hours as needed (abdominal pain)., Disp: 30 capsule, Rfl: 0   docusate sodium (COLACE) 100 MG capsule, Take 1 capsule (100 mg total) by mouth daily. (Patient taking differently: Take 100 mg by mouth daily as needed for mild constipation.), Disp: 14 capsule, Rfl: 0   fluticasone (FLOVENT HFA) 44 MCG/ACT inhaler, Inhale 1 puff into the lungs daily., Disp: 30.8 g, Rfl: 1   HYDROcodone-acetaminophen (NORCO/VICODIN) 5-325 MG tablet, Take 1 tablet by mouth 2 (two) times daily as needed for severe pain., Disp: 30 tablet, Rfl: 0   levalbuterol (XOPENEX HFA) 45 MCG/ACT inhaler, INHALE 1 PUFF INTO THE LUNGS EVERY 6 HOURS AS NEEDED FOR SHORTNESS OF BREATH (Patient taking differently: Inhale 1 puff into the lungs every 6 (six) hours as needed for shortness of breath.), Disp: 45 g, Rfl: 1   montelukast (SINGULAIR) 10 MG tablet, Take 1 tablet (10 mg total) by mouth at bedtime., Disp: 90 tablet, Rfl: 3   omeprazole (PRILOSEC) 40 MG capsule, Take 1 capsule (40 mg total) by mouth in the morning and at bedtime., Disp: 90 capsule, Rfl: 1   tiotropium (SPIRIVA) 18 MCG inhalation capsule, Place 1 capsule (18 mcg total) into inhaler and inhale  daily., Disp: 90 capsule, Rfl: 3  BP 131/75 (BP Location: Right Arm, Patient Position: Sitting, Cuff Size: Small)   Pulse 73   Temp 98.2 F (36.8 C) (Oral)   Ht '5\' 6"'$  (1.676 m)   Wt 132 lb 4.8 oz (60 kg)   SpO2 100%   BMI 21.35 kg/m   Problem-appropriate exam findings in problem list.

## 2022-01-21 NOTE — Telephone Encounter (Signed)
Requesting to speak with a nurse about acid reflux, please call pt back.

## 2022-01-21 NOTE — Assessment & Plan Note (Signed)
Treatment completed, follow-up stool sample negative for H. pylori.  She feels much better.

## 2022-01-21 NOTE — Assessment & Plan Note (Signed)
Remains adherent to bisphosphonate.  No falls.  No fractures.  Continue therapy.

## 2022-01-21 NOTE — Assessment & Plan Note (Signed)
Pain at recent visit resolved with BM and passage of flatus.  She had been advised to take 2 caps twice daily of the MiraLAX, which is currently causing her to be very crampy and resulting in very soft BMs.  She questions whether she can reduce the frequency and amount-yes.  Advised to reduce to once or twice a day, and adjusted to comfortable bowel movement.  She infrequently takes opioid now that she knows it contributes to constipation.

## 2022-01-29 IMAGING — CT CT ABD-PELV W/ CM
2 of 5 series · 16 of 46 positions shown, 18 images · IV contrast (APPLIED)
Comparison: 08/11/2020 unenhanced CT abdomen/pelvis. 01/03/2020 CT
angiogram of the abdomen and pelvis.

CLINICAL DATA: Low back and left lower quadrant abdominal pain.
Atrial fibrillation. Palpitations. Anticoagulated.

EXAM:
CT ABDOMEN AND PELVIS WITH CONTRAST
TECHNIQUE: Multidetector CT imaging of the abdomen and pelvis was performed
using the standard protocol following bolus administration of
intravenous contrast.
CONTRAST:  100mL OMNIPAQUE IOHEXOL 300 MG/ML  SOLN

[Series 3: abd/ pelvis 5.0 i30f 2 · axial · 0.90mm/px · z∈[+821,+1161]mm · 13 of 77 slices shown, 15 images]
[im 5/77  soft-tissue]
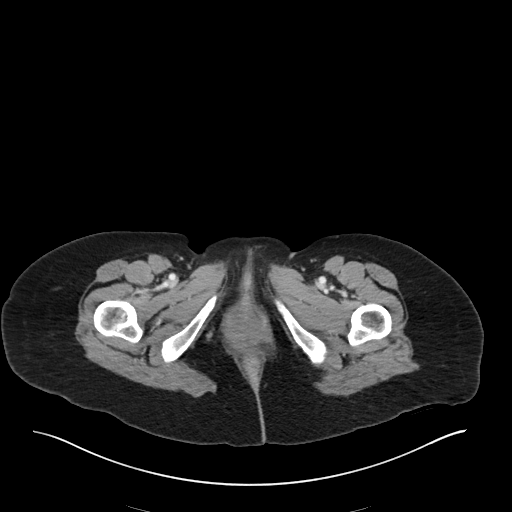
[im 5/77  bone]
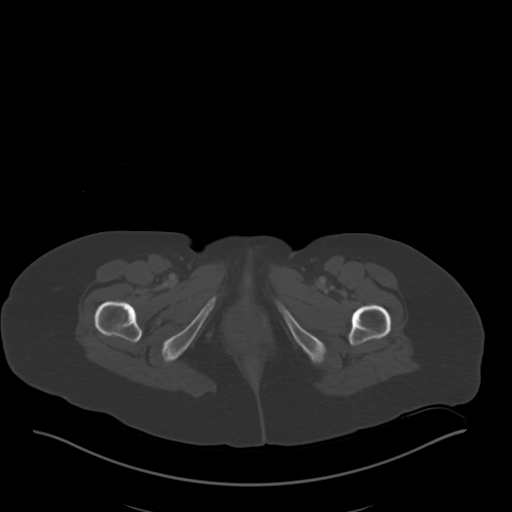
[im 13/77  soft-tissue]
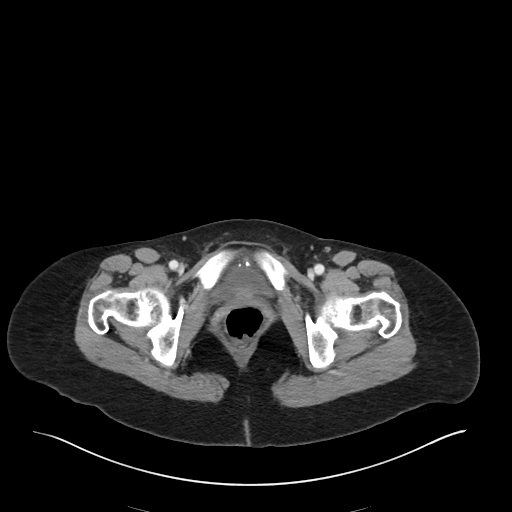
[im 17/77  soft-tissue]
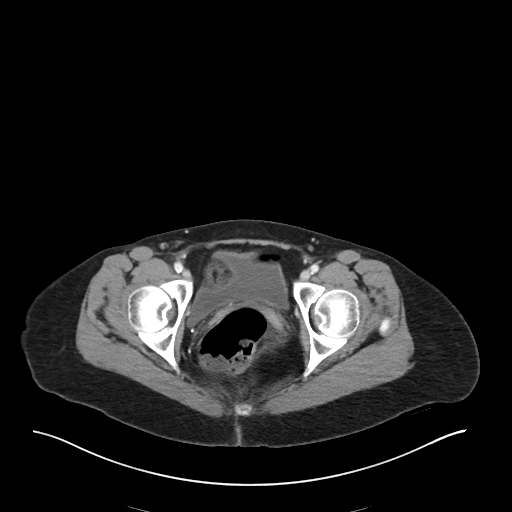
[im 21/77  soft-tissue]
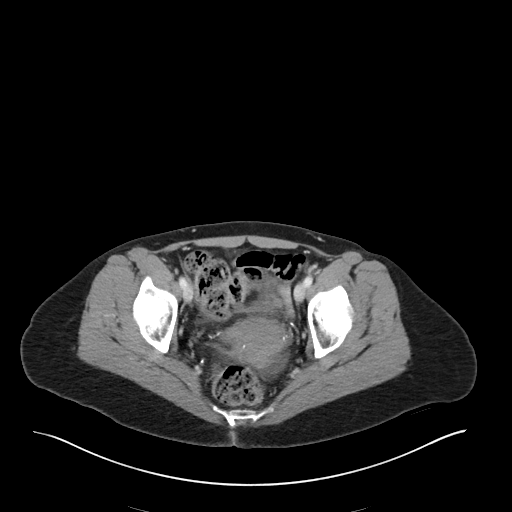
[im 29/77  soft-tissue]
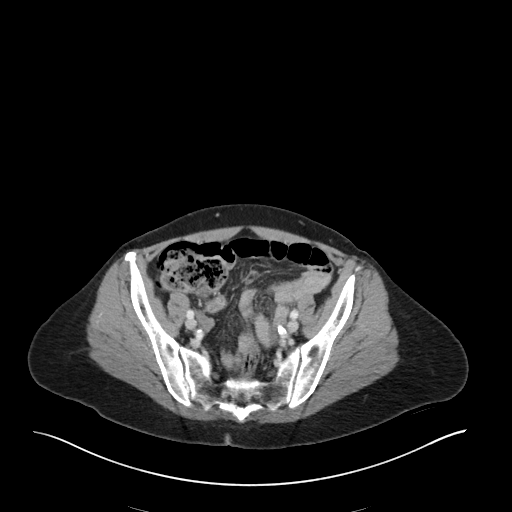
[im 33/77  soft-tissue]
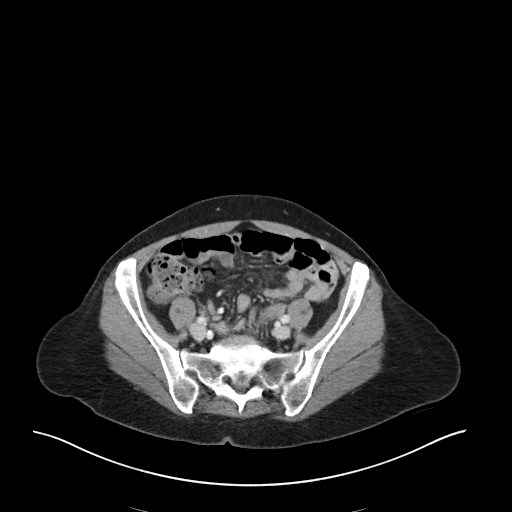
[im 41/77  soft-tissue]
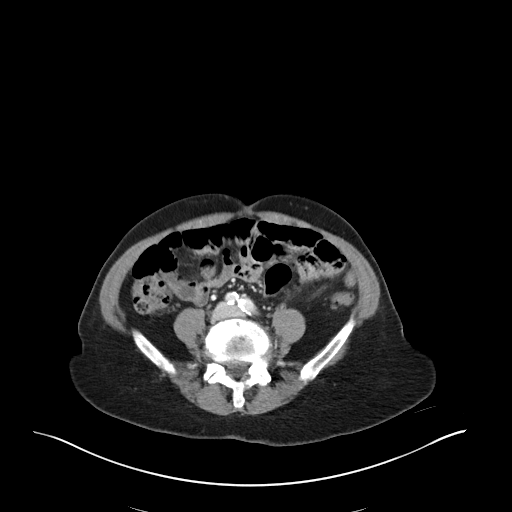
[im 45/77  soft-tissue]
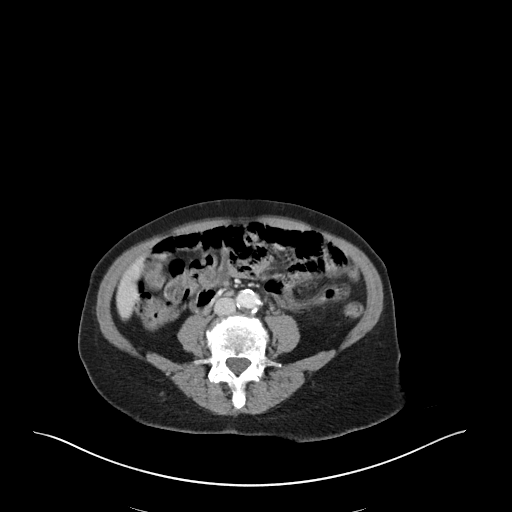
[im 49/77  soft-tissue]
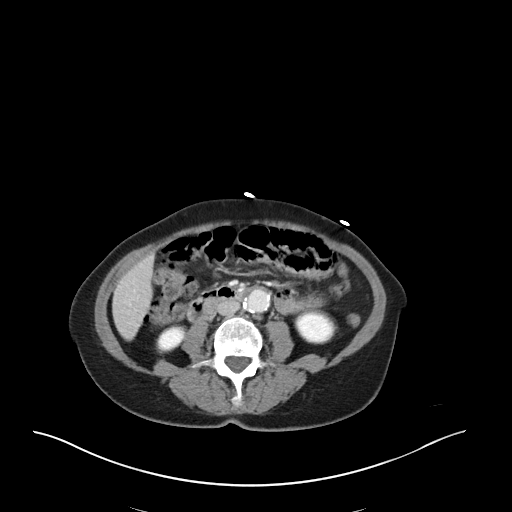
[im 49/77  bone]
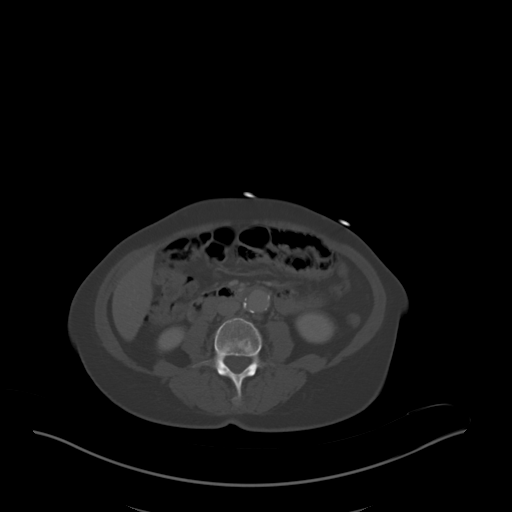
[im 57/77  soft-tissue]
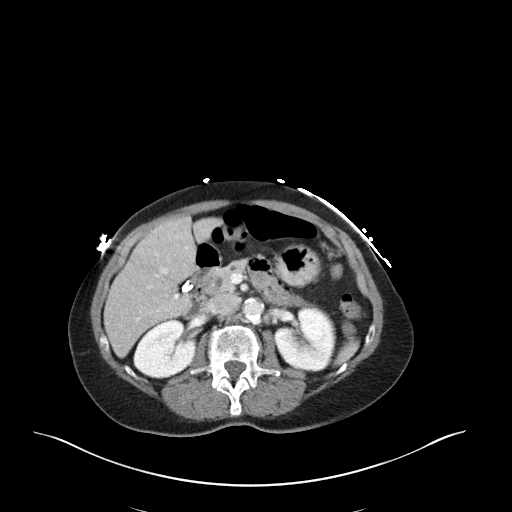
[im 61/77  soft-tissue]
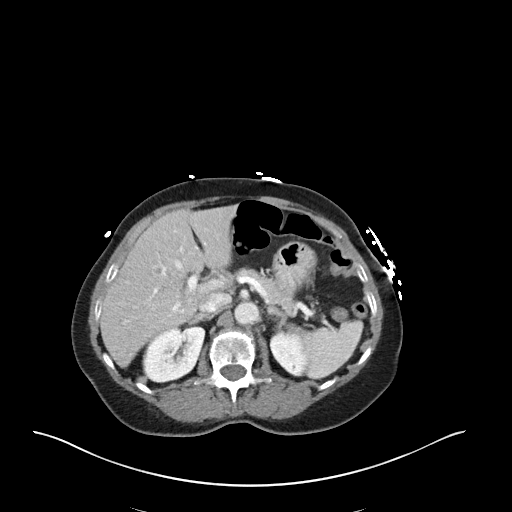
[im 65/77  soft-tissue]
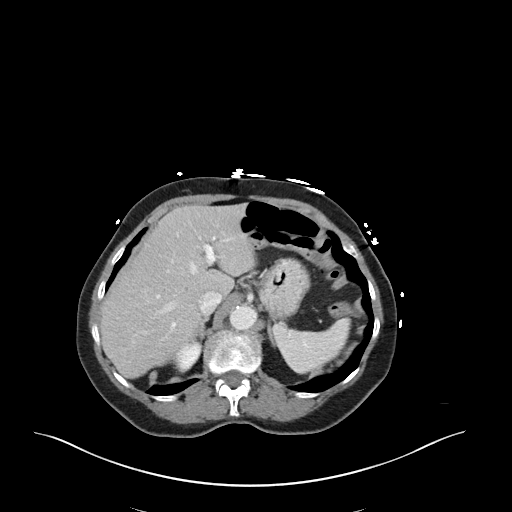
[im 73/77  soft-tissue]
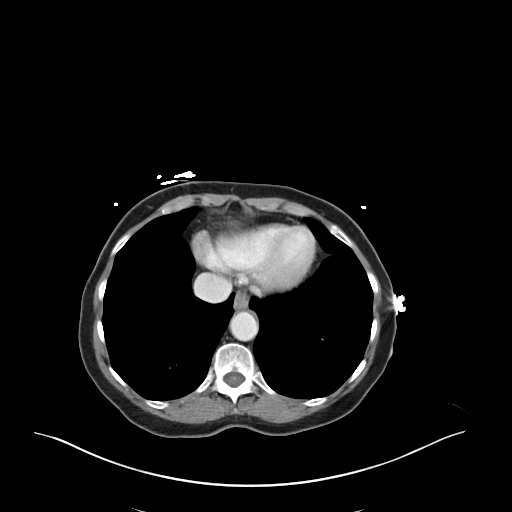

[Series 6: coronal soft tissue · coronal · 0.75mm/px · 3 of 97 slices shown]
[im 33/97  soft-tissue]
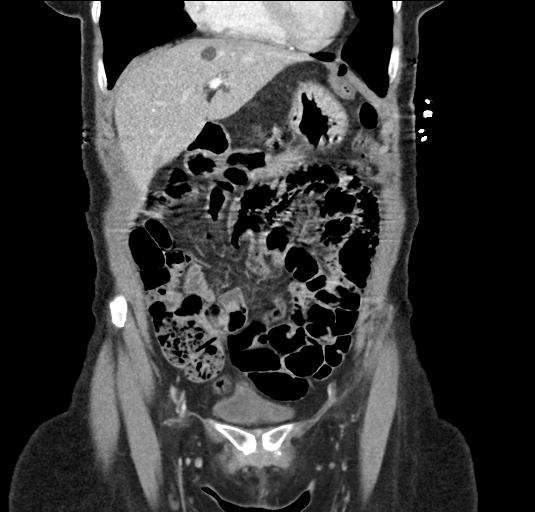
[im 43/97  soft-tissue]
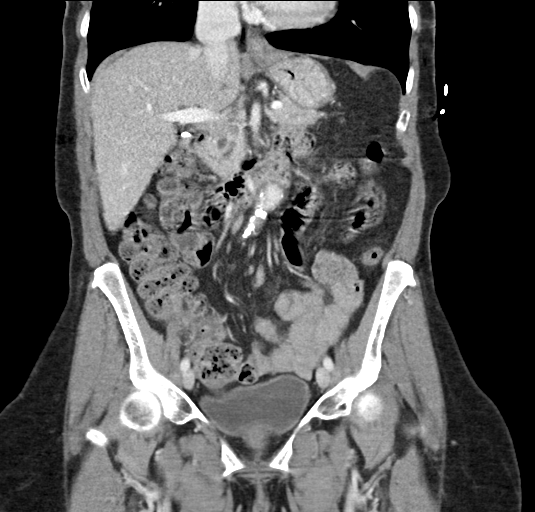
[im 54/97  soft-tissue]
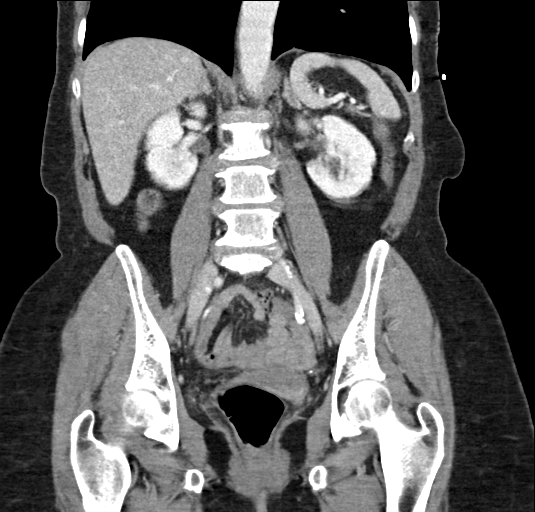

[16 of 46 positions shown; findings below may reference images not displayed]

FINDINGS: Lower chest: No significant pulmonary nodules or acute consolidative
airspace disease.

Hepatobiliary: Normal liver size. Two small simple left liver cysts,
largest 1.2 cm. Several subcentimeter hypodense lesions scattered in
the liver, too small to characterize, not appreciably changed,
presumably benign. No appreciable new liver lesions.
Cholecystectomy. Bile ducts are stable and within normal post
cholecystectomy limits with CBD diameter 6 mm.

Pancreas: Normal, with no mass or duct dilation.

Spleen: Normal size. No mass.

Adrenals/Urinary Tract: Normal adrenals. No hydronephrosis.
Subcentimeter hypodense medial lower left renal cortical lesion is
too small to characterize and requires no follow-up. No suspicious
renal masses. Normal bladder.

Stomach/Bowel: Small hiatal hernia. Otherwise normal nondistended
stomach. Normal caliber small bowel with no small bowel wall
thickening. Appendix not discretely visualized. No pericecal
inflammatory changes. Minimal left colonic diverticulosis with no
large bowel wall thickening or significant pericolonic fat
stranding.

Vascular/Lymphatic: Atherosclerotic abdominal aorta with 2.6 cm
ectatic infrarenal abdominal aorta. Patent portal, splenic, hepatic
and renal veins. Circumaortic left renal vein. No pathologically
enlarged lymph nodes in the abdomen or pelvis.

Reproductive: Grossly normal uterus.  No adnexal mass.

Other: No pneumoperitoneum, ascites or focal fluid collection.

Musculoskeletal: No aggressive appearing focal osseous lesions.
Marked thoracolumbar spondylosis.
IMPRESSION: 1. No acute abnormality. No evidence of bowel obstruction or acute
bowel inflammation. Minimal left colonic diverticulosis, with no
evidence of acute diverticulitis.
2. Small hiatal hernia.
3. Ectatic 2.6 cm infrarenal abdominal aorta. Recommend follow-up
ultrasound every 5 years. This recommendation follows ACR consensus
guidelines: White Paper of the ACR Incidental Findings Committee II
4. Aortic Atherosclerosis (PBAE3-J1K.K).

## 2022-01-29 IMAGING — CT CT L SPINE W/O CM
3 series · 11 of 33 positions shown, 13 images · IV contrast (APPLIED)
Comparison: Lumbar spine radiographs 10/03/2020. Chest CTA
05/29/2017.

CLINICAL DATA: Low back pain.

EXAM:
CT LUMBAR SPINE WITH CONTRAST
TECHNIQUE: Multiplanar CT images of the lumbar spine were reconstructed from
contemporary CT of the abdomen and pelvis.
CONTRAST:  100 mL Omnipaque 300 given for concurrent CT of the
abdomen and pelvis

[Series 3: abd/ pelvis 5.0 i30f 2 · axial · 0.31mm/px · z∈[+968,+1133]mm · 3 of 55 slices shown, 4 images]
[im 13/55  soft-tissue]
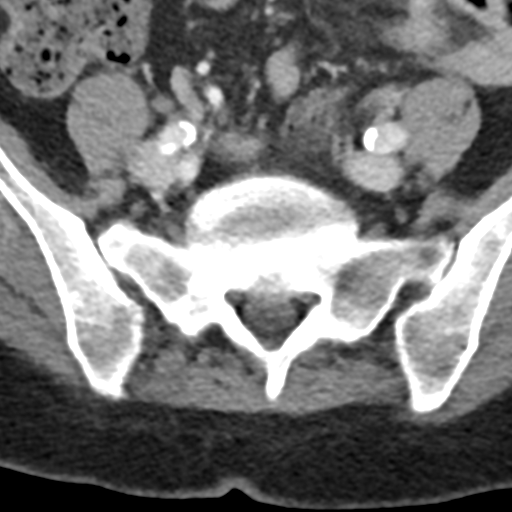
[im 13/55  bone]
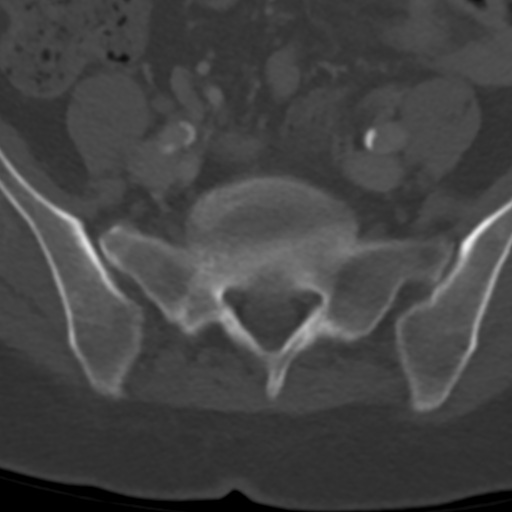
[im 30/55  bone]
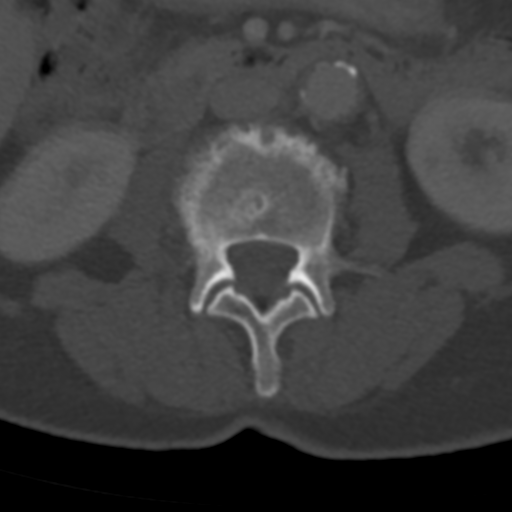
[im 46/55  bone]
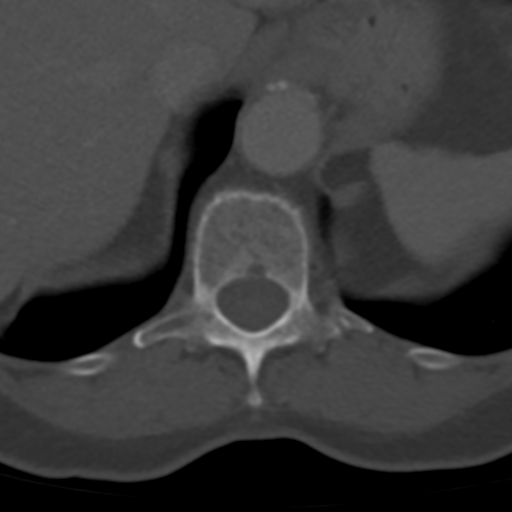

[Series 5: coronal bone · coronal · 0.36mm/px · 3 of 62 slices shown]
[im 13/62  bone]
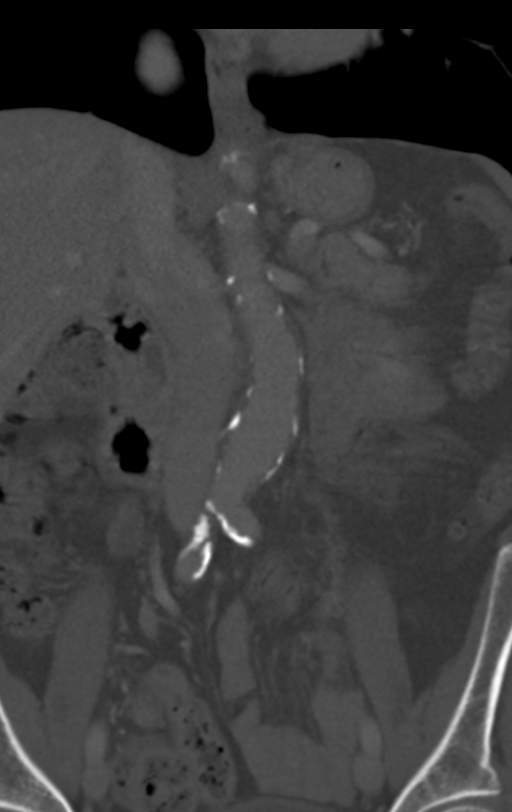
[im 25/62  bone]
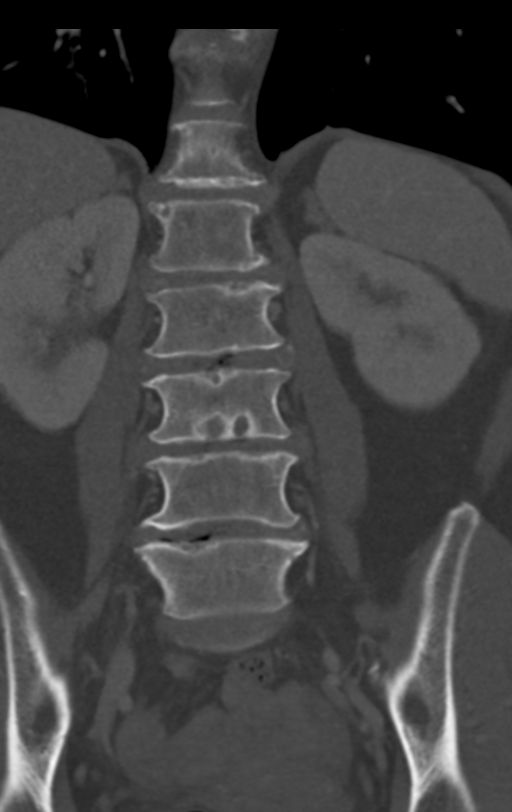
[im 37/62  bone]
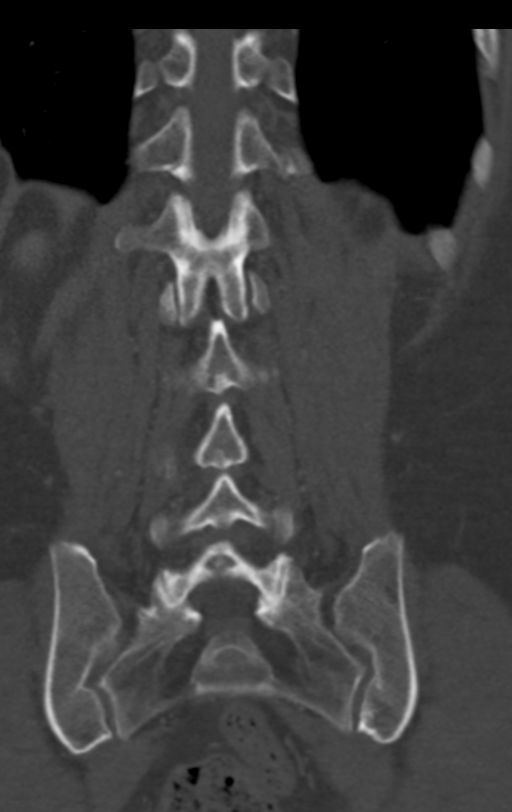

[Series 6: sagittal bone · sagittal · 0.39mm/px · 5 of 55 slices shown, 6 images]
[im 19/55  bone]
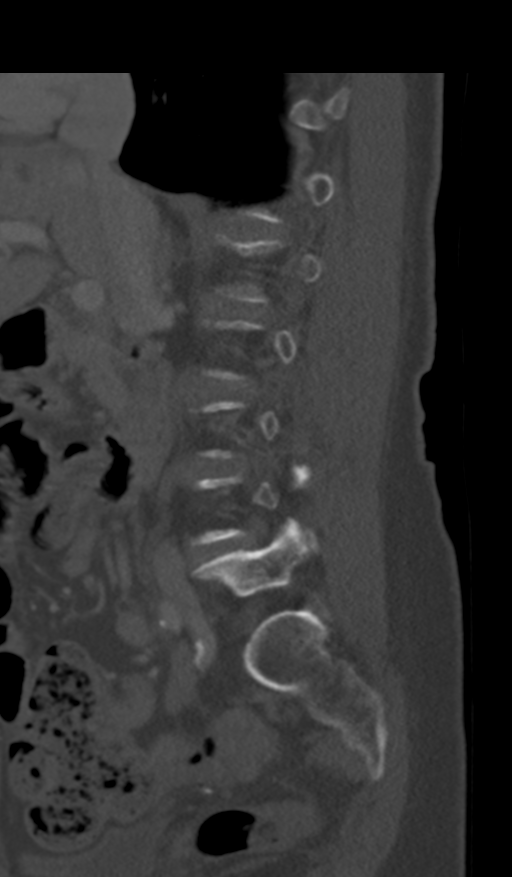
[im 23/55  bone]
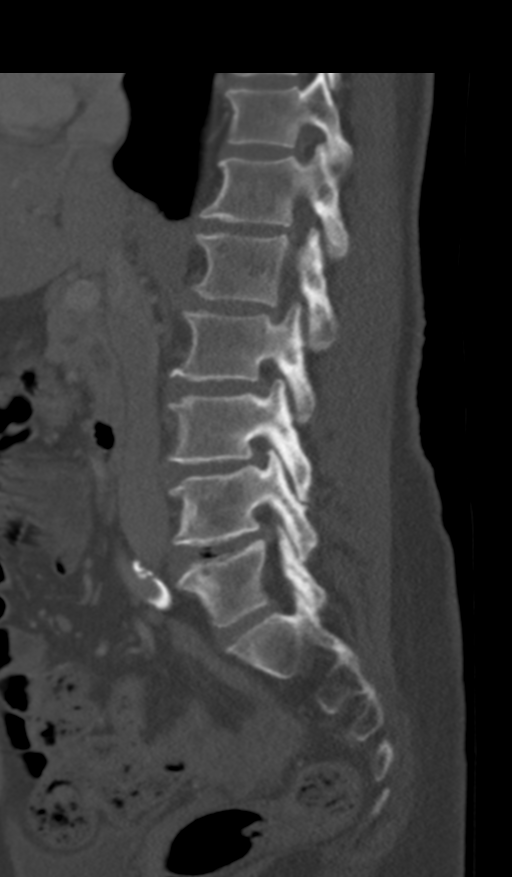
[im 28/55  soft-tissue]
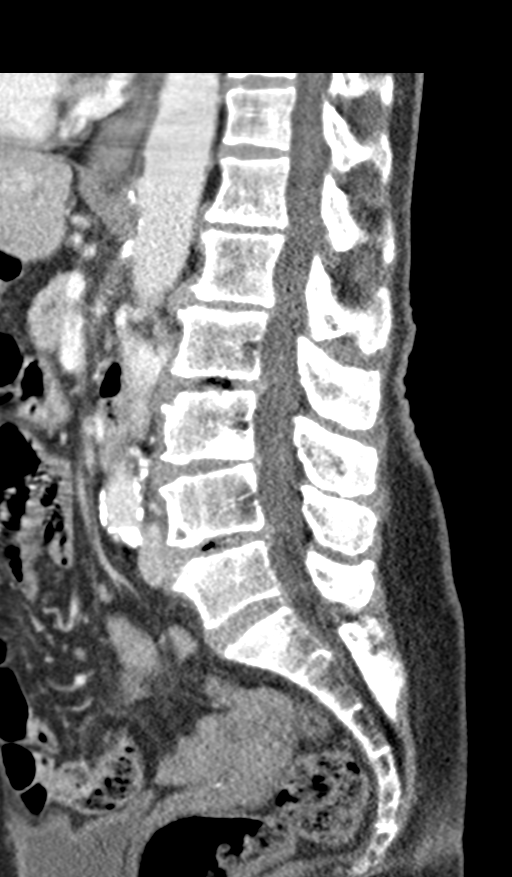
[im 28/55  bone]
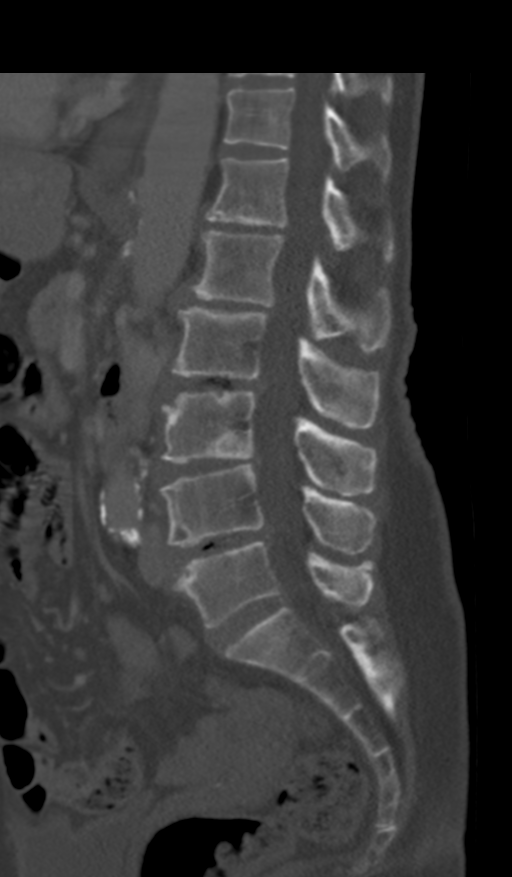
[im 32/55  bone]
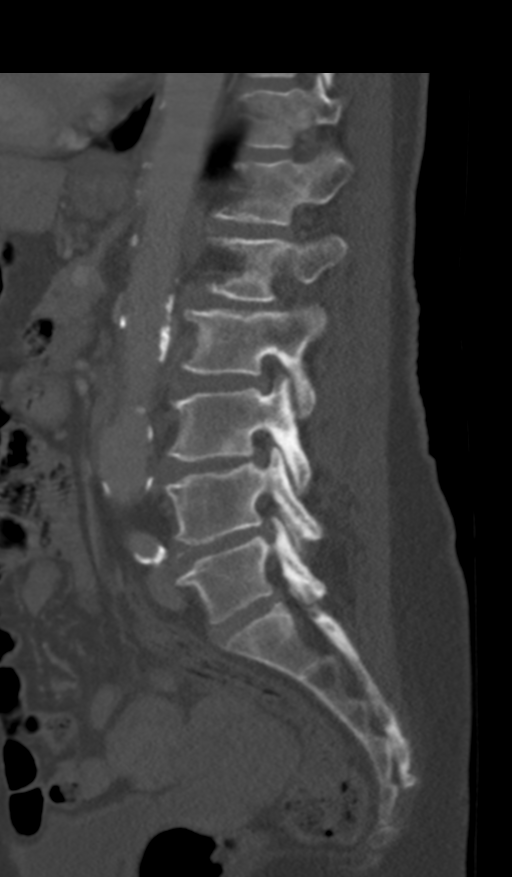
[im 37/55  bone]
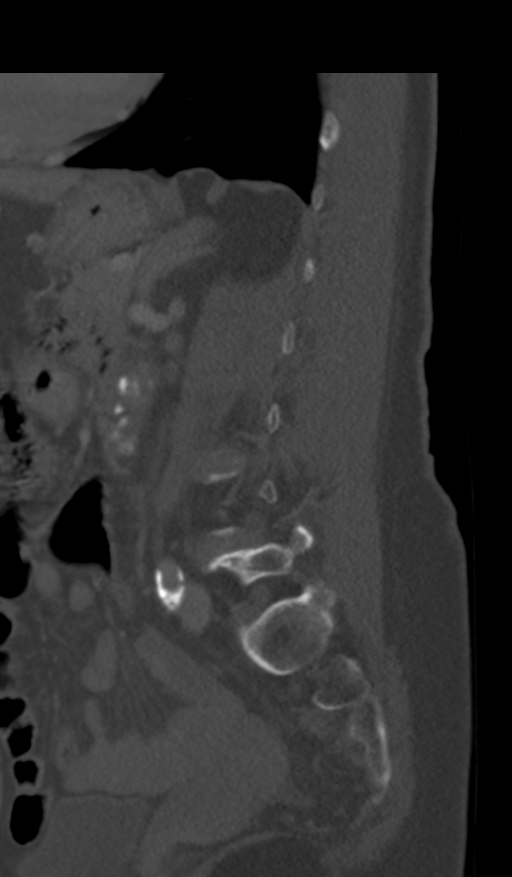

[11 of 33 positions shown; findings below may reference images not displayed]

FINDINGS: Segmentation: There is transitional lumbosacral anatomy. The prior
chest CT demonstrates 12 pairs of ribs. There are 5 lumbar vertebrae
followed by the transitional segment which is considered a nearly
completely lumbarized S1 with fully formed S1-2 disc.

Alignment: Trace retrolisthesis of L2 on L3.

Vertebrae: No acute fracture or suspicious osseous lesion. Scattered
Schmorl's nodes, most notably involving the L4 inferior endplate.

Paraspinal and other soft tissues: No acute abnormality identified
in the paraspinal soft tissues. Intra-abdominal and pelvic contents
reported separately.

Disc levels: Mild-to-moderate disc space narrowing from L1-2 to
L5-S1.

L1-2: Mild disc bulging without evidence of significant stenosis.

L2-3: Disc bulging results in mild bilateral neural foraminal
stenosis without evidence of significant spinal stenosis.

L3-4: Disc bulging results in mild-to-moderate bilateral neural
foraminal stenosis without evidence of significant spinal stenosis.

L4-5: Disc bulging and mild facet arthrosis result in moderate to
severe bilateral neural foraminal stenosis with potential bilateral
L4 nerve root impingement. No evidence of significant spinal
stenosis.

L5-S1: Disc bulging and mild facet arthrosis result in moderate
bilateral neural foraminal stenosis without evidence of significant
spinal stenosis.

S1-2: Minimal leftward disc bulging and mild facet arthrosis without
stenosis.
IMPRESSION: 1. Transitional lumbosacral anatomy as above.
2. No acute osseous abnormality identified in the lumbar spine.
3. Lumbar disc and facet degeneration with multilevel neural
foraminal stenosis, moderate to severe bilaterally at L3-4.
4. No significant spinal stenosis.

## 2022-02-08 ENCOUNTER — Telehealth: Payer: Self-pay | Admitting: Internal Medicine

## 2022-02-08 DIAGNOSIS — S36039D Unspecified laceration of spleen, subsequent encounter: Secondary | ICD-10-CM

## 2022-02-08 MED ORDER — HYDROCODONE-ACETAMINOPHEN 5-325 MG PO TABS: ORAL_TABLET | ORAL | 0 refills | Status: DC

## 2022-02-08 NOTE — Telephone Encounter (Signed)
Refill Request  HYDROcodone-acetaminophen (NORCO/VICODIN) 5-325 MG per tablet 1 tablet   [[id number]]    [id number] - [place], [place] - [place] (Ph: [phone])

## 2022-02-08 NOTE — Telephone Encounter (Signed)
Filled one month ago by Dr. Jimmye Norman.  Reviewed last note and commented on by Dr. Jimmye Norman in note from 6/1.  I think the medication may have expired and fallen off the list.  Will refill X 1 month supply (30 tab) to be used sparingly and then let Dr. Jimmye Norman decide about further refills.  PDMP reviewed and appears appropriate.  Gilles Chiquito, MD

## 2022-02-08 NOTE — Telephone Encounter (Signed)
Hydrocodone is not on current med list. Pt stated she takes it for legs/back pain d/t arthritis.

## 2022-02-19 ENCOUNTER — Ambulatory Visit: Payer: Medicare Other

## 2022-03-04 ENCOUNTER — Ambulatory Visit: Payer: Medicare Other

## 2022-03-04 ENCOUNTER — Other Ambulatory Visit: Payer: Self-pay

## 2022-03-04 MED ORDER — LEVALBUTEROL TARTRATE 45 MCG/ACT IN AERO: 1.0000 | INHALATION_SPRAY | Freq: Four times a day (QID) | RESPIRATORY_TRACT | 5 refills | Status: DC | PRN

## 2022-03-04 MED ORDER — MONTELUKAST SODIUM 10 MG PO TABS: 10.0000 mg | ORAL_TABLET | Freq: Every day | ORAL | 3 refills | Status: DC

## 2022-03-04 NOTE — Telephone Encounter (Signed)
montelukast (SINGULAIR) 10 MG tablet,   rlevalbuterol (XOPENEX HFA) 45 MCG/ACT inhaler  refill request @ [facility] #[id number] - [place], [place] - [place].

## 2022-03-08 ENCOUNTER — Other Ambulatory Visit: Payer: Self-pay | Admitting: Internal Medicine

## 2022-03-08 DIAGNOSIS — S36039D Unspecified laceration of spleen, subsequent encounter: Secondary | ICD-10-CM

## 2022-03-08 MED ORDER — HYDROCODONE-ACETAMINOPHEN 5-325 MG PO TABS: ORAL_TABLET | ORAL | 0 refills | Status: DC

## 2022-03-08 NOTE — Telephone Encounter (Signed)
Refill Request  HYDROcodone-acetaminophen (NORCO/VICODIN) 5-325 MG tablet   [id number] - [place], [place] - [address]   

## 2022-03-12 ENCOUNTER — Ambulatory Visit
Admission: RE | Admit: 2022-03-12 | Discharge: 2022-03-12 | Disposition: A | Discharge status: Home / Self Care | Payer: Medicare Other | Source: Ambulatory Visit | Attending: Internal Medicine | Admitting: Internal Medicine

## 2022-03-12 DIAGNOSIS — Z1231 Encounter for screening mammogram for malignant neoplasm of breast: Secondary | ICD-10-CM | POA: Diagnosis not present

## 2022-04-01 ENCOUNTER — Other Ambulatory Visit: Payer: Self-pay | Admitting: *Deleted

## 2022-04-01 DIAGNOSIS — S36039D Unspecified laceration of spleen, subsequent encounter: Secondary | ICD-10-CM
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
## 2022-04-02 NOTE — Telephone Encounter (Signed)
Refill Request  HYDROcodone-acetaminophen (NORCO/VICODIN) 5-325 MG tablet   [id number] - [place], [place] - [address]   

## 2022-04-02 NOTE — Telephone Encounter (Signed)
No ToxAssure.  Last appointment 01/21/2022.

## 2022-04-05 ENCOUNTER — Ambulatory Visit: Payer: Self-pay | Admitting: Licensed Clinical Social Worker

## 2022-04-05 MED ORDER — HYDROCODONE-ACETAMINOPHEN 5-325 MG PO TABS: ORAL_TABLET | ORAL | 0 refills | Status: DC

## 2022-04-05 NOTE — Patient Outreach (Signed)
  Care Coordination   Initial Visit Note   04/05/2022 Name: [name] MRN: [id number] DOB: 12/14/1950  [name] is a 71 y.o. year old female who sees Jimmye Norman, Elaina Pattee, MD for primary care. I spoke with  [name] by phone today  What matters to the patients health and wellness today?  Patient in need of Arthritis medication.   Goals Addressed               This Visit's Progress     MSW Care Coordination (pt-stated)        Patient advised she is in need of her Arthritis medication.  Patient advised the pharmacy usually has the order by now.   SW will send message to care team.         SDOH assessments and interventions completed:  Yes     Care Coordination Interventions Activated:  Yes  Care Coordination Interventions:  Yes, provided   Follow up plan: No further intervention required.   Encounter Outcome:  Pt. Visit Completed   Lenor Derrick , MSW Social Worker [facility] Care Management  [phone]

## 2022-04-05 NOTE — Patient Instructions (Signed)
Visit Information  Instructions:   Patient was given the following information about care management and care coordination services today, agreed to services, and gave verbal consent: 1.care management/care coordination services include personalized support from designated clinical staff supervised by their physician, including individualized plan of care and coordination with other care providers 2. 24/7 contact phone numbers for assistance for urgent and routine care needs. 3. The patient may stop care management/care coordination services at any time by phone call to the office staff.  Patient verbalizes understanding of instructions and care plan provided today and agrees to view in [place]. Active MyChart status and patient understanding of how to access instructions and care plan via MyChart confirmed with patient.     No further follow up required: . The [facility] team will follow up with the patient and will provide direct communication to the PCP for this patient.   Lenor Derrick, MSW   Social Worker [facility] Care Management  [phone]

## 2022-04-07 ENCOUNTER — Telehealth: Payer: Self-pay | Admitting: *Deleted

## 2022-04-07 NOTE — Telephone Encounter (Signed)
Called pt to find out which med she needs refilling - no answer; left message for pt to call the office.

## 2022-04-07 NOTE — Telephone Encounter (Signed)
-----   Message from [name] sent at 04/05/2022  3:48 PM EDT ----- Patient in need of Arthritis medication. Patient requested SW to say tell Dr. Jimmye Norman Thank you and she appreciates the care she is receiving.

## 2022-04-18 ENCOUNTER — Encounter (HOSPITAL_COMMUNITY): Payer: Self-pay

## 2022-04-18 ENCOUNTER — Emergency Department (HOSPITAL_COMMUNITY): Payer: Medicare Other

## 2022-04-18 ENCOUNTER — Emergency Department (HOSPITAL_COMMUNITY)
Admission: EM | Admit: 2022-04-18 | Discharge: 2022-04-18 | Disposition: A | Discharge status: Home / Self Care | Payer: Medicare Other | Attending: Emergency Medicine | Admitting: Emergency Medicine

## 2022-04-18 DIAGNOSIS — Z7901 Long term (current) use of anticoagulants: Secondary | ICD-10-CM | POA: Insufficient documentation

## 2022-04-18 DIAGNOSIS — Z8616 Personal history of COVID-19: Secondary | ICD-10-CM | POA: Diagnosis not present

## 2022-04-18 DIAGNOSIS — K5900 Constipation, unspecified: Secondary | ICD-10-CM | POA: Diagnosis not present

## 2022-04-18 DIAGNOSIS — Z20822 Contact with and (suspected) exposure to covid-19: Secondary | ICD-10-CM | POA: Diagnosis not present

## 2022-04-18 DIAGNOSIS — I7143 Infrarenal abdominal aortic aneurysm, without rupture: Secondary | ICD-10-CM | POA: Diagnosis not present

## 2022-04-18 DIAGNOSIS — R1110 Vomiting, unspecified: Secondary | ICD-10-CM | POA: Diagnosis not present

## 2022-04-18 DIAGNOSIS — N289 Disorder of kidney and ureter, unspecified: Secondary | ICD-10-CM | POA: Diagnosis not present

## 2022-04-18 DIAGNOSIS — J4520 Mild intermittent asthma, uncomplicated: Secondary | ICD-10-CM | POA: Insufficient documentation

## 2022-04-18 DIAGNOSIS — Z87891 Personal history of nicotine dependence: Secondary | ICD-10-CM | POA: Insufficient documentation

## 2022-04-18 DIAGNOSIS — K769 Liver disease, unspecified: Secondary | ICD-10-CM | POA: Diagnosis not present

## 2022-04-18 DIAGNOSIS — R109 Unspecified abdominal pain: Secondary | ICD-10-CM

## 2022-04-18 DIAGNOSIS — Z7951 Long term (current) use of inhaled steroids: Secondary | ICD-10-CM | POA: Insufficient documentation

## 2022-04-18 LAB — CBC WITH DIFFERENTIAL/PLATELET
Abs Immature Granulocytes: 0.01 10*3/uL (ref 0.00–0.07)
Basophils Absolute: 0 10*3/uL (ref 0.0–0.1)
Basophils Relative: 1 %
Eosinophils Absolute: 0 10*3/uL (ref 0.0–0.5)
Eosinophils Relative: 1 %
HCT: 40.5 % (ref 36.0–46.0)
Hemoglobin: 13 g/dL (ref 12.0–15.0)
Immature Granulocytes: 0 %
Lymphocytes Relative: 28 %
Lymphs Abs: 1 10*3/uL (ref 0.7–4.0)
MCH: 25.8 pg — ABNORMAL LOW (ref 26.0–34.0)
MCHC: 32.1 g/dL (ref 30.0–36.0)
MCV: 80.5 fL (ref 80.0–100.0)
Monocytes Absolute: 0.4 10*3/uL (ref 0.1–1.0)
Monocytes Relative: 10 %
Neutro Abs: 2.3 10*3/uL (ref 1.7–7.7)
Neutrophils Relative %: 60 %
Platelets: 215 10*3/uL (ref 150–400)
RBC: 5.03 MIL/uL (ref 3.87–5.11)
RDW: 19.6 % — ABNORMAL HIGH (ref 11.5–15.5)
WBC: 3.7 10*3/uL — ABNORMAL LOW (ref 4.0–10.5)
nRBC: 0 % (ref 0.0–0.2)

## 2022-04-18 LAB — URINALYSIS, ROUTINE W REFLEX MICROSCOPIC
Bilirubin Urine: NEGATIVE
Glucose, UA: NEGATIVE mg/dL
Ketones, ur: NEGATIVE mg/dL
Leukocytes,Ua: NEGATIVE
Nitrite: NEGATIVE
Protein, ur: 30 mg/dL — AB
Specific Gravity, Urine: 1.009 (ref 1.005–1.030)
pH: 7 (ref 5.0–8.0)

## 2022-04-18 LAB — RESP PANEL BY RT-PCR (FLU A&B, COVID) ARPGX2
Influenza A by PCR: NEGATIVE
Influenza B by PCR: NEGATIVE
SARS Coronavirus 2 by RT PCR: NEGATIVE

## 2022-04-18 LAB — COMPREHENSIVE METABOLIC PANEL
ALT: 20 U/L (ref 0–44)
AST: 22 U/L (ref 15–41)
Albumin: 4.3 g/dL (ref 3.5–5.0)
Alkaline Phosphatase: 67 U/L (ref 38–126)
Anion gap: 8 (ref 5–15)
BUN: 9 mg/dL (ref 8–23)
CO2: 24 mmol/L (ref 22–32)
Calcium: 9.4 mg/dL (ref 8.9–10.3)
Chloride: 107 mmol/L (ref 98–111)
Creatinine, Ser: 0.78 mg/dL (ref 0.44–1.00)
GFR, Estimated: >60 mL/min (ref >60)
Glucose, Bld: 101 mg/dL — ABNORMAL HIGH (ref 70–99)
Potassium: 3.2 mmol/L — ABNORMAL LOW (ref 3.5–5.1)
Sodium: 139 mmol/L (ref 135–145)
Total Bilirubin: 0.5 mg/dL (ref 0.3–1.2)
Total Protein: 7.9 g/dL (ref 6.5–8.1)

## 2022-04-18 LAB — LIPASE, BLOOD: Lipase: 29 U/L (ref 11–51)

## 2022-04-18 MED ORDER — FAMOTIDINE IN NACL 20-0.9 MG/50ML-% IV SOLN
20.0000 mg | Freq: Once | INTRAVENOUS | Status: AC
  Administered 2022-04-18: 20 mg via INTRAVENOUS
  Filled 2022-04-18: qty 50

## 2022-04-18 MED ORDER — ONDANSETRON HCL 4 MG PO TABS
4.0000 mg | ORAL_TABLET | ORAL | 0 refills | Status: DC | PRN
Start: 2022-04-18 — End: 2022-09-06

## 2022-04-18 MED ORDER — ONDANSETRON HCL 4 MG/2ML IJ SOLN
4.0000 mg | Freq: Once | INTRAMUSCULAR | Status: AC
  Administered 2022-04-18: 4 mg via INTRAVENOUS
  Filled 2022-04-18: qty 2

## 2022-04-18 MED ORDER — LIDOCAINE VISCOUS HCL 2 % MT SOLN
15.0000 mL | Freq: Once | OROMUCOSAL | Status: AC
  Administered 2022-04-18: 15 mL via ORAL
  Filled 2022-04-18: qty 15

## 2022-04-18 MED ORDER — IOHEXOL 300 MG/ML  SOLN
100.0000 mL | Freq: Once | INTRAMUSCULAR | Status: AC | PRN
  Administered 2022-04-18: 100 mL via INTRAVENOUS

## 2022-04-18 MED ORDER — SODIUM CHLORIDE (PF) 0.9 % IJ SOLN
INTRAMUSCULAR | Status: AC
  Filled 2022-04-18: qty 50

## 2022-04-18 MED ORDER — ALUM & MAG HYDROXIDE-SIMETH 200-200-20 MG/5ML PO SUSP
30.0000 mL | Freq: Once | ORAL | Status: AC
  Administered 2022-04-18: 30 mL via ORAL
  Filled 2022-04-18: qty 30

## 2022-04-18 MED ORDER — MAGNESIUM CITRATE PO SOLN
1.0000 | Freq: Once | ORAL | Status: AC
  Administered 2022-04-18: 1 via ORAL
  Filled 2022-04-18: qty 296

## 2022-04-18 MED ORDER — SODIUM CHLORIDE 0.9 % IV BOLUS
1000.0000 mL | Freq: Once | INTRAVENOUS | Status: AC
  Administered 2022-04-18: 1000 mL via INTRAVENOUS

## 2022-04-18 NOTE — Discharge Instructions (Addendum)
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 For constipation we also recommend a diet high in fiber (beans, fruits, vegetables, whole grains). Take Colace 100-200 mg up to three times per day. You may take along with Senokot 1-2 tabs, ingest with full glass of water.  You may also take MiraLAX 1-2 capfuls 1-2 times a day until stools become soft and then slowly decrease the amount of MiraLAX used.  Maintain fluid intake 6-8 glasses per day. Please increase fibers in your diet. You may also take Milk of Magnesia 30 mL as needed for constipation, you may repeat in 2 hours again if no bowl movement.   It was a pleasure caring for you today in the emergency department.  Please return to the emergency department for any worsening or worrisome symptoms.

## 2022-04-18 NOTE — ED Triage Notes (Signed)
Pt arrived via POV, c/o n/v, headache, congestion for two days. Denies any diarrhea or urinary sx. Denies any known sick contacts.

## 2022-04-18 NOTE — ED Provider Notes (Signed)
[facility] DEPT Provider Note   CSN: [id number] Arrival date & time: 04/18/22  0813     History {Add pertinent medical, surgical, social history, OB history to HPI:1} Chief Complaint  Patient presents with   Emesis    [name] is a 71 y.o. female.  Patient as above with significant medical history as below, including *** who presents to the ED with complaint of ***     Past Medical History:  Diagnosis Date   Asthma in adult, mild intermittent, uncomplicated    Chronic anticoagulation    Chronic constipation    COVID-19 virus infection 10/19/2021   Eczema    GERD (gastroesophageal reflux disease)    H. pylori infection 09/24/2021   Identified on EGD biopsy.  Treatment will be discussed with her GI doctor.  SHe will need a very simple regimen which has no contraindications in combination with her existing medication regimen.     Hemoperitoneum 09/27/2021   Due to splenic lac occurring after colonoscopy on 09/24/21.  Xarelto resumed.    History of kidney stones    Hx of vaginal bleeding, resolved, ultrasound completed 10/11/2018   Hypertension    Laceration of spleen    Presumed complication of colonoscopy 09/24/21.  Pain and hemoperitoneum.  Xarelto resumed.  Short term opioid pain management.     Personal history of atrial fibrillation, s/p ablation, now in NSR but remains on anticoaulation 11/26/2015   Followed in cardiology clinic     Past Surgical History:  Procedure Laterality Date   ATRIAL FIBRILLATION ABLATION N/A 09/30/2017   Procedure: [place];  Surgeon: Thompson Grayer, MD;  Location: [facility];  Service: Cardiovascular;  Laterality: N/A;   BIOPSY  09/24/2021   Procedure: BIOPSY;  Surgeon: Sharyn Creamer, MD;  Location: [facility];  Service: Gastroenterology;;   CHOLECYSTECTOMY N/A 12/08/2018   Procedure: LAPAROSCOPIC CHOLECYSTECTOMY WITH INTRAOPERATIVE CHOLANGIOGRAM;  Surgeon: Jovita Kussmaul, MD;  Location:  [place];  Service: General;  Laterality: N/A;   COLONOSCOPY WITH PROPOFOL N/A 09/24/2021   Procedure: COLONOSCOPY WITH PROPOFOL;  Surgeon: Sharyn Creamer, MD;  Location: [facility];  Service: Gastroenterology;  Laterality: N/A;   ERCP N/A 12/09/2018   Procedure: ENDOSCOPIC RETROGRADE CHOLANGIOPANCREATOGRAPHY (ERCP);  Surgeon: Carol Ada, MD;  Location: [facility];  Service: Endoscopy;  Laterality: N/A;   ESOPHAGOGASTRODUODENOSCOPY (EGD) WITH PROPOFOL N/A 09/24/2021   Procedure: ESOPHAGOGASTRODUODENOSCOPY (EGD) WITH PROPOFOL;  Surgeon: Sharyn Creamer, MD;  Location: [facility];  Service: Gastroenterology;  Laterality: N/A;   POLYPECTOMY  09/24/2021   Procedure: POLYPECTOMY;  Surgeon: Sharyn Creamer, MD;  Location: [facility];  Service: Gastroenterology;;   Joan Mayans  12/09/2018   Procedure: Joan Mayans;  Surgeon: Carol Ada, MD;  Location: [place];  Service: Endoscopy;;   TONSILLECTOMY     TUBAL LIGATION       The history is provided by the patient. No language interpreter was used.  Emesis      Home Medications Prior to Admission medications   Medication Sig Start Date End Date Taking? Authorizing Provider  alendronate (FOSAMAX) 70 MG tablet Take 1 tablet (70 mg total) by mouth every 7 (seven) days. Take with a full glass of water on an empty stomach. 06/09/21 06/09/22 Yes Angelica Pou, MD  atorvastatin (LIPITOR) 40 MG tablet Take 1 tablet (40 mg total) by mouth daily. 08/27/21 08/27/22 Yes Angelica Pou, MD  cetirizine (ZYRTEC) 10 MG tablet Take 10 mg by mouth daily.   Yes  [provider]  dicyclomine (BENTYL) 10 MG capsule Take 1 capsule (10 mg total) by mouth every 8 (eight) hours as needed (abdominal pain). 01/21/22 04/18/22 Yes Angelica Pou, MD  diltiazem (CARDIZEM) 60 MG tablet Take 1 tablet (60 mg total) by mouth every 6 (six) hours as needed (afib). 08/14/21  Yes Sherran Needs, NP  diltiazem (CARTIA XT) 120 MG 24 hr capsule Take 1  capsule (120 mg total) by mouth 2 (two) times daily. 09/18/21  Yes Sherran Needs, NP  docusate sodium (COLACE) 100 MG capsule Take 1 capsule (100 mg total) by mouth daily. Patient taking differently: Take 100 mg by mouth daily as needed for mild constipation. 06/15/21  Yes Nuala Alpha A, PA-C  ezetimibe (ZETIA) 10 MG tablet Take 1 tablet (10 mg total) by mouth daily. 12/07/21 12/07/22 Yes Angelica Pou, MD  fluticasone (FLOVENT HFA) 44 MCG/ACT inhaler Inhale 1 puff into the lungs daily. 09/08/21  Yes Angelica Pou, MD  HYDROcodone-acetaminophen (NORCO/VICODIN) 5-325 MG tablet 1/2 to 1 tablet only when needed for severe pain, as often as every 6 hours. 04/05/22  Yes Angelica Pou, MD  levalbuterol [facility] HFA) 45 MCG/ACT inhaler Inhale 1 puff into the lungs every 6 (six) hours as needed for shortness of breath. 03/04/22  Yes Angelica Pou, MD  losartan (COZAAR) 50 MG tablet Take 2 tablets (100 mg total) by mouth daily. 08/27/21 04/18/22 Yes Angelica Pou, MD  montelukast (SINGULAIR) 10 MG tablet Take 1 tablet (10 mg total) by mouth at bedtime. 03/04/22  Yes Angelica Pou, MD  omeprazole (PRILOSEC) 40 MG capsule Take 1 capsule (40 mg total) by mouth in the morning and at bedtime. 01/01/22  Yes Iona Beard, MD  polyethylene glycol (MIRALAX / GLYCOLAX) 17 g packet Take 17 g by mouth 2 (two) times daily. 09/29/21  Yes Timothy Lasso, MD  potassium chloride (KLOR-CON M) 10 MEQ tablet Take 1 tablet (10 mEq total) by mouth daily. 01/12/22  Yes Angelica Pou, MD  rivaroxaban (XARELTO) 20 MG TABS tablet Take 1 tablet (20 mg total) by mouth daily with supper. 01/12/22  Yes Sherran Needs, NP  simethicone (GAS-X) 80 MG chewable tablet Chew 1 tablet (80 mg total) by mouth 4 (four) times daily as needed for up to 7 days for flatulence. 01/01/22 04/18/22 Yes Iona Beard, MD  tiotropium (SPIRIVA) 18 MCG inhalation capsule Place 1 capsule (18 mcg total) into inhaler  and inhale daily. 10/16/21  Yes Angelica Pou, MD      Allergies    Albuterol, Percocet [oxycodone-acetaminophen], Celecoxib, and Tramadol    Review of Systems   Review of Systems  Gastrointestinal:  Positive for vomiting.    Physical Exam Updated Vital Signs BP (!) 147/79 (BP Location: Right Arm)   Pulse 73   Temp 98.2 F (36.8 C) (Oral)   Resp 16   SpO2 98%  Physical Exam  ED Results / Procedures / Treatments   Labs (all labs ordered are listed, but only abnormal results are displayed) Labs Reviewed  CBC WITH DIFFERENTIAL/PLATELET - Abnormal; Notable for the following components:      Result Value   WBC 3.7 (*)    MCH 25.8 (*)    RDW 19.6 (*)    All other components within normal limits  COMPREHENSIVE METABOLIC PANEL - Abnormal; Notable for the following components:   Potassium 3.2 (*)    Glucose, Bld 101 (*)    All other components within normal  limits  URINALYSIS, ROUTINE W REFLEX MICROSCOPIC - Abnormal; Notable for the following components:   APPearance HAZY (*)    Hgb urine dipstick MODERATE (*)    Protein, ur 30 (*)    Bacteria, UA RARE (*)    All other components within normal limits  RESP PANEL BY RT-PCR (FLU A&B, COVID) ARPGX2  LIPASE, BLOOD    EKG None  Radiology CT ABDOMEN PELVIS W CONTRAST  Result Date: 04/18/2022 CLINICAL DATA:  Abdominal pain EXAM: CT ABDOMEN AND PELVIS WITH CONTRAST TECHNIQUE: Multidetector CT imaging of the abdomen and pelvis was performed using the standard protocol following bolus administration of intravenous contrast. RADIATION DOSE REDUCTION: This exam was performed according to the departmental dose-optimization program which includes automated exposure control, adjustment of the mA and/or kV according to patient size and/or use of iterative reconstruction technique. CONTRAST:  145m OMNIPAQUE IOHEXOL 300 MG/ML  SOLN COMPARISON:  CT abdomen and pelvis dated Jan 13, 2022 FINDINGS: Lower chest: Small hiatal hernia. Solid  pulmonary nodule of the right lower lobe measuring 5 mm on series 6, image 4, unchanged when compared with prior exams dating back to August 11, 2020 and likely benign. Hepatobiliary: Scattered low-density liver lesions are unchanged when compared with prior exam and likely simple cysts. Gallbladder is surgically absent. Unchanged dilation of the common bile duct, not unexpected status post cholecystectomy Pancreas: Unchanged prominence of the main pancreatic duct dismissed pronounced at the pancreatic head Spleen: Normal in size without focal abnormality. Adrenals/Urinary Tract: Bilateral adrenal glands are unremarkable. No hydronephrosis. Bilateral low-attenuation renal lesions are too small to completely characterize, no specific follow-up imaging is recommended. Bladder is unremarkable. Stomach/Bowel: Stomach is within normal limits. Appendix appears normal. No evidence of bowel wall thickening, distention, or inflammatory changes. Vascular/Lymphatic: Aortic atherosclerosis. No enlarged abdominal or pelvic lymph nodes. Reproductive: Uterus and bilateral adnexa are unremarkable. Other: No abdominal wall hernia or abnormality. No abdominopelvic ascites. Musculoskeletal: No acute or significant osseous findings. IMPRESSION: 1. No acute findings in the abdomen or pelvis. 2.  Aortic Atherosclerosis (ICD10-I70.0). 3. Infrarenal abdominal aortic aneurysm measuring up to 2.3 cm. Recommend follow-up ultrasound every 5 years. This recommendation follows ACR consensus guidelines: White Paper of the ACR Incidental Findings Committee II on Vascular Findings. J Am Coll Radiol 2013; 10:789-794. Electronically Signed   By: LYetta GlassmanM.D.   On: 04/18/2022 12:38    Procedures Procedures  {Document cardiac monitor, telemetry assessment procedure when appropriate:1}  Medications Ordered in ED Medications  magnesium citrate solution 1 Bottle (has no administration in time range)  sodium chloride 0.9 % bolus 1,000  mL (0 mLs Intravenous Stopped 04/18/22 1116)  ondansetron (ZOFRAN) injection 4 mg (4 mg Intravenous Given 04/18/22 1110)  sodium chloride (PF) 0.9 % injection (  Given 04/18/22 1215)  iohexol (OMNIPAQUE) 300 MG/ML solution 100 mL (100 mLs Intravenous Contrast Given 04/18/22 1143)  famotidine (PEPCID) IVPB 20 mg premix (20 mg Intravenous New Bag/Given 04/18/22 1340)  alum & mag hydroxide-simeth (MAALOX/MYLANTA) 200-200-20 MG/5ML suspension 30 mL (30 mLs Oral Given 04/18/22 1340)    And  lidocaine (XYLOCAINE) 2 % viscous mouth solution 15 mL (15 mLs Oral Given 04/18/22 1341)    ED Course/ Medical Decision Making/ A&P Clinical Course as of 04/18/22 1434  Sun Apr 18, 2022  1434 "3. Infrarenal abdominal aortic aneurysm measuring up to 2.3 cm. Recommend follow-up ultrasound every 5 years. " [SG]    Clinical Course User Index [SG] GJeanell Sparrow DO  Medical Decision Making Amount and/or Complexity of Data Reviewed Labs: ordered. Radiology: ordered.  Risk OTC drugs. Prescription drug management.   This patient presents to the ED with chief complaint(s) of *** with pertinent past medical history of *** which further complicates the presenting complaint. The complaint involves an extensive differential diagnosis and also carries with it a high risk of complications and morbidity.    The differential diagnosis includes but not limited to ***. Serious etiologies were considered.   The initial plan is to ***   Additional history obtained: Additional history obtained from {additional history:26846} Records reviewed {records:26847}  Independent labs interpretation:  The following labs were independently interpreted: ***  Independent visualization of imaging: - I independently visualized the following imaging with scope of interpretation limited to determining acute life threatening conditions related to emergency care: ***, which revealed ***  Cardiac monitoring was  reviewed and interpreted by myself which shows ***  Treatment and Reassessment: ***  Consultation: - Consulted or discussed management/test interpretation w/ external professional: ***  Consideration for admission or further workup: Admission was considered ***  Social Determinants of health: Social History   Tobacco Use   Smoking status: Former    Types: Cigarettes    Quit date: 10/24/2015    Years since quitting: 6.4   Smokeless tobacco: Never  Vaping Use   Vaping Use: Never used  Substance Use Topics   Alcohol use: Not Currently   Drug use: Not Currently    Types: Marijuana    Comment: Smoked marijuana in the past.      {Document critical care time when appropriate:1} {Document review of labs and clinical decision tools ie heart score, Chads2Vasc2 etc:1}  {Document your independent review of radiology images, and any outside records:1} {Document your discussion with family members, caretakers, and with consultants:1} {Document social determinants of health affecting pt's care:1} {Document your decision making why or why not admission, treatments were needed:1} Final Clinical Impression(s) / ED Diagnoses Final diagnoses:  None    Rx / DC Orders ED Discharge Orders     None

## 2022-04-20 ENCOUNTER — Telehealth: Payer: Self-pay

## 2022-04-20 NOTE — Patient Outreach (Signed)
  Care Coordination TOC Note Transition Care Management Follow-up Telephone Call Date of discharge and from where: [facility] 04/18/22 How have you been since you were released from the hospital? "I am feeling much, much better. I think I had a flair up on my acid reflux". Any questions or concerns? No  Items Reviewed: Did the pt receive and understand the discharge instructions provided? Yes  Medications obtained and verified? Yes  Other? No  Any new allergies since your discharge? No  Dietary orders reviewed? Yes Do you have support at home? Yes   Home Care and Equipment/Supplies: Were home health services ordered? no If so, what is the name of the agency? N/A  Has the agency set up a time to come to the patient's home? no Were any new equipment or medical supplies ordered?  No What is the name of the medical supply agency? N/A Were you able to get the supplies/equipment? no Do you have any questions related to the use of the equipment or supplies? No  Functional Questionnaire: (I = Independent and D = Dependent) ADLs: I  Bathing/Dressing- I  Meal Prep- I  Eating- I  Maintaining continence- I  Transferring/Ambulation- I  Managing Meds- I  Follow up appointments reviewed:  PCP Hospital f/u appt confirmed? No  Patient will call and schedule appointment.  Encouraged to call clinic if any symptoms recur. [facility] f/u appt confirmed? No   Are transportation arrangements needed? No  If their condition worsens, is the pt aware to call PCP or go to the Emergency Dept.? Yes Was the patient provided with contact information for the PCP's office or ED? Yes Was to pt encouraged to call back with questions or concerns? Yes  SDOH assessments and interventions completed:   Yes  Care Coordination Interventions Activated:  Yes   Care Coordination Interventions:   No further interventions needed at this time     Encounter Outcome:  Pt. Visit Completed

## 2022-05-03 ENCOUNTER — Other Ambulatory Visit (HOSPITAL_COMMUNITY): Payer: Self-pay | Admitting: *Deleted

## 2022-05-03 MED ORDER — DILTIAZEM HCL ER COATED BEADS 120 MG PO CP24: 120.0000 mg | ORAL_CAPSULE | Freq: Two times a day (BID) | ORAL | 2 refills | Status: DC

## 2022-05-06 ENCOUNTER — Other Ambulatory Visit: Payer: Self-pay

## 2022-05-06 DIAGNOSIS — S36039D Unspecified laceration of spleen, subsequent encounter: Secondary | ICD-10-CM

## 2022-05-07 MED ORDER — HYDROCODONE-ACETAMINOPHEN 5-325 MG PO TABS: ORAL_TABLET | ORAL | 0 refills | Status: DC

## 2022-05-09 ENCOUNTER — Emergency Department (HOSPITAL_COMMUNITY)
Admission: EM | Admit: 2022-05-09 | Discharge: 2022-05-09 | Disposition: A | Discharge status: Home / Self Care | Payer: Medicare Other | Attending: Emergency Medicine | Admitting: Emergency Medicine

## 2022-05-09 ENCOUNTER — Other Ambulatory Visit: Payer: Self-pay

## 2022-05-09 ENCOUNTER — Emergency Department (HOSPITAL_COMMUNITY): Payer: Medicare Other

## 2022-05-09 ENCOUNTER — Encounter (HOSPITAL_COMMUNITY): Payer: Self-pay | Admitting: Emergency Medicine

## 2022-05-09 DIAGNOSIS — M436 Torticollis: Secondary | ICD-10-CM | POA: Diagnosis not present

## 2022-05-09 DIAGNOSIS — Z79899 Other long term (current) drug therapy: Secondary | ICD-10-CM | POA: Diagnosis not present

## 2022-05-09 DIAGNOSIS — R030 Elevated blood-pressure reading, without diagnosis of hypertension: Secondary | ICD-10-CM | POA: Insufficient documentation

## 2022-05-09 DIAGNOSIS — R519 Headache, unspecified: Secondary | ICD-10-CM | POA: Insufficient documentation

## 2022-05-09 DIAGNOSIS — M5030 Other cervical disc degeneration, unspecified cervical region: Secondary | ICD-10-CM | POA: Diagnosis not present

## 2022-05-09 DIAGNOSIS — M47812 Spondylosis without myelopathy or radiculopathy, cervical region: Secondary | ICD-10-CM | POA: Diagnosis not present

## 2022-05-09 DIAGNOSIS — M542 Cervicalgia: Secondary | ICD-10-CM | POA: Diagnosis not present

## 2022-05-09 DIAGNOSIS — S199XXA Unspecified injury of neck, initial encounter: Secondary | ICD-10-CM | POA: Diagnosis present

## 2022-05-09 DIAGNOSIS — I10 Essential (primary) hypertension: Secondary | ICD-10-CM | POA: Diagnosis not present

## 2022-05-09 DIAGNOSIS — S161XXA Strain of muscle, fascia and tendon at neck level, initial encounter: Secondary | ICD-10-CM | POA: Diagnosis not present

## 2022-05-09 DIAGNOSIS — X58XXXA Exposure to other specified factors, initial encounter: Secondary | ICD-10-CM | POA: Insufficient documentation

## 2022-05-09 MED ORDER — METHOCARBAMOL 750 MG PO TABS: 750.0000 mg | ORAL_TABLET | Freq: Three times a day (TID) | ORAL | 0 refills | Status: DC | PRN

## 2022-05-09 MED ORDER — HYDROMORPHONE HCL 1 MG/ML IJ SOLN
0.5000 mg | Freq: Once | INTRAMUSCULAR | Status: AC
  Administered 2022-05-09: 0.5 mg via INTRAVENOUS
  Filled 2022-05-09: qty 1

## 2022-05-09 MED ORDER — METHOCARBAMOL 500 MG PO TABS
750.0000 mg | ORAL_TABLET | Freq: Once | ORAL | Status: AC
  Administered 2022-05-09: 750 mg via ORAL
  Filled 2022-05-09: qty 2

## 2022-05-09 NOTE — ED Triage Notes (Signed)
Patient complains of right neck pain radiating into the back of her head that started last night, pain is exacerbated by rotating her head away from location of pain. Patient denies shortness of breaht, denies dizziness, denies nausea and vomiting.

## 2022-05-09 NOTE — ED Notes (Signed)
Patient transported to CT 

## 2022-05-09 NOTE — ED Provider Notes (Signed)
[facility] EMERGENCY DEPARTMENT Provider Note   CSN: [id number] Arrival date & time: 05/09/22  6010     History  Chief Complaint  Patient presents with   Neck Pain    [name] is a 71 y.o. female.  Pt c/o right neck/side of head pain since this AM. States went to bed and felt fine/normal. Around 2 am when awoke to go to bathroom, noted pain to right side of posterior neck, with movement shoots up to head and laterally towards shoulder. Denies hx same. Unsure of slept funny. Denies trauma or fall. No pain down arm. No numbness/weakness. No swelling to area. Is on anticoag therapy, hx afib. No hx ddd.   The history is provided by the patient and medical records.  Neck Pain Associated symptoms: no chest pain, no fever, no numbness and no weakness        Home Medications Prior to Admission medications   Medication Sig Start Date End Date Taking? Authorizing Provider  alendronate (FOSAMAX) 70 MG tablet Take 1 tablet (70 mg total) by mouth every 7 (seven) days. Take with a full glass of water on an empty stomach. 06/09/21 06/09/22  Angelica Pou, MD  atorvastatin (LIPITOR) 40 MG tablet Take 1 tablet (40 mg total) by mouth daily. 08/27/21 08/27/22  Angelica Pou, MD  cetirizine (ZYRTEC) 10 MG tablet Take 10 mg by mouth daily.    [provider]  dicyclomine (BENTYL) 10 MG capsule Take 1 capsule (10 mg total) by mouth every 8 (eight) hours as needed (abdominal pain). 01/21/22 04/18/22  Angelica Pou, MD  diltiazem (CARDIZEM) 60 MG tablet Take 1 tablet (60 mg total) by mouth every 6 (six) hours as needed (afib). 08/14/21   Sherran Needs, NP  diltiazem (CARTIA XT) 120 MG 24 hr capsule Take 1 capsule (120 mg total) by mouth 2 (two) times daily. 05/03/22   Sherran Needs, NP  docusate sodium (COLACE) 100 MG capsule Take 1 capsule (100 mg total) by mouth daily. Patient taking differently: Take 100 mg by mouth daily as needed for mild  constipation. 06/15/21   Nuala Alpha A, PA-C  ezetimibe (ZETIA) 10 MG tablet Take 1 tablet (10 mg total) by mouth daily. 12/07/21 12/07/22  Angelica Pou, MD  fluticasone (FLOVENT HFA) 44 MCG/ACT inhaler Inhale 1 puff into the lungs daily. 09/08/21   Angelica Pou, MD  HYDROcodone-acetaminophen (NORCO/VICODIN) 5-325 MG tablet 1/2 to 1 tablet only when needed for severe pain, as often as every 6 hours. 05/07/22   Angelica Pou, MD  levalbuterol [facility] HFA) 45 MCG/ACT inhaler Inhale 1 puff into the lungs every 6 (six) hours as needed for shortness of breath. 03/04/22   Angelica Pou, MD  losartan (COZAAR) 50 MG tablet Take 2 tablets (100 mg total) by mouth daily. 08/27/21 04/18/22  Angelica Pou, MD  montelukast (SINGULAIR) 10 MG tablet Take 1 tablet (10 mg total) by mouth at bedtime. 03/04/22   Angelica Pou, MD  omeprazole (PRILOSEC) 40 MG capsule Take 1 capsule (40 mg total) by mouth in the morning and at bedtime. 01/01/22   Iona Beard, MD  ondansetron (ZOFRAN) 4 MG tablet Take 1 tablet (4 mg total) by mouth every 4 (four) hours as needed for nausea or vomiting. 04/18/22   Jeanell Sparrow, DO  polyethylene glycol (MIRALAX / GLYCOLAX) 17 g packet Take 17 g by mouth 2 (two) times daily. 09/29/21   Timothy Lasso, MD  potassium chloride (KLOR-CON M) 10 MEQ tablet Take 1 tablet (10 mEq total) by mouth daily. 01/12/22   Angelica Pou, MD  rivaroxaban (XARELTO) 20 MG TABS tablet Take 1 tablet (20 mg total) by mouth daily with supper. 01/12/22   Sherran Needs, NP  simethicone (GAS-X) 80 MG chewable tablet Chew 1 tablet (80 mg total) by mouth 4 (four) times daily as needed for up to 7 days for flatulence. 01/01/22 04/18/22  Iona Beard, MD  tiotropium (SPIRIVA) 18 MCG inhalation capsule Place 1 capsule (18 mcg total) into inhaler and inhale daily. 10/16/21   Angelica Pou, MD      Allergies    Albuterol, Percocet [oxycodone-acetaminophen], Celecoxib,  and Tramadol    Review of Systems   Review of Systems  Constitutional:  Negative for chills and fever.  HENT:  Negative for nosebleeds.   Eyes:  Negative for pain and visual disturbance.  Respiratory:  Negative for cough and shortness of breath.   Cardiovascular:  Negative for chest pain.  Gastrointestinal:  Negative for abdominal pain, nausea and vomiting.  Genitourinary:  Negative for flank pain.  Musculoskeletal:  Positive for neck pain. Negative for back pain.  Skin:  Negative for rash.  Neurological:  Negative for weakness and numbness.  Hematological:  Does not bruise/bleed easily.  Psychiatric/Behavioral:  Negative for confusion.     Physical Exam Updated Vital Signs BP (!) 148/85   Pulse 65   Temp 97.9 F (36.6 C) (Oral)   Resp 18   SpO2 98%  Physical Exam Vitals and nursing note reviewed.  Constitutional:      Appearance: Normal appearance. She is well-developed.  HENT:     Head: Atraumatic.     Nose: Nose normal.     Mouth/Throat:     Mouth: Mucous membranes are moist.  Eyes:     General: No scleral icterus.    Conjunctiva/sclera: Conjunctivae normal.     Pupils: Pupils are equal, round, and reactive to light.  Neck:     Trachea: No tracheal deviation.     Comments: Pain/tenderness ? Spasm, right lateral neck and right trapezius.  Cardiovascular:     Rate and Rhythm: Normal rate.     Pulses: Normal pulses.  Pulmonary:     Effort: Pulmonary effort is normal. No respiratory distress.  Abdominal:     General: There is no distension.     Tenderness: There is no abdominal tenderness.  Genitourinary:    Comments: No cva tenderness.  Musculoskeletal:        General: No swelling.     Cervical back: Normal range of motion and neck supple. No rigidity. No muscular tenderness.     Comments: Mid cervical tenderness, spine aligned. Right neck/trapezius muscular pain/spasm.   Skin:    General: Skin is warm and dry.     Findings: No rash.  Neurological:      Mental Status: She is alert.     Comments: Alert, speech normal. Motor/sens grossly intact bil. Steady gait.   Psychiatric:        Mood and Affect: Mood normal.     ED Results / Procedures / Treatments   Labs (all labs ordered are listed, but only abnormal results are displayed) Labs Reviewed - No data to display  EKG None  Radiology CT Head Wo Contrast  Result Date: 05/09/2022 CLINICAL DATA:  Headache and neck pain EXAM: CT HEAD WITHOUT CONTRAST CT CERVICAL SPINE WITHOUT CONTRAST TECHNIQUE: Multidetector CT imaging of the head  and cervical spine was performed following the standard protocol without intravenous contrast. Multiplanar CT image reconstructions of the cervical spine were also generated. RADIATION DOSE REDUCTION: This exam was performed according to the departmental dose-optimization program which includes automated exposure control, adjustment of the mA and/or kV according to patient size and/or use of iterative reconstruction technique. COMPARISON:  CT head 04/21/2021 FINDINGS: CT HEAD FINDINGS Brain: No evidence of acute infarction, hemorrhage, hydrocephalus, extra-axial collection or mass lesion/mass effect. Vascular: Negative for hyperdense vessel Skull: Negative Sinuses/Orbits: Paranasal sinuses clear.  Negative orbit Other: None CT CERVICAL SPINE FINDINGS Alignment: Mild anterolisthesis C3-4. Mild retrolisthesis C5-6 and C6-7 Skull base and vertebrae: Negative for fracture Soft tissues and spinal canal: Negative for soft tissue mass or edema Disc levels: Multilevel disc degeneration and spurring most prominent C4 through C7. Mild spinal and foraminal stenosis. Upper chest: Lung apices clear bilaterally Other: None IMPRESSION: 1. Negative CT head 2. Cervical spondylosis.  Negative for fracture Electronically Signed   By: Franchot Gallo M.D.   On: 05/09/2022 15:04   CT Cervical Spine Wo Contrast  Result Date: 05/09/2022 CLINICAL DATA:  Headache and neck pain EXAM: CT HEAD  WITHOUT CONTRAST CT CERVICAL SPINE WITHOUT CONTRAST TECHNIQUE: Multidetector CT imaging of the head and cervical spine was performed following the standard protocol without intravenous contrast. Multiplanar CT image reconstructions of the cervical spine were also generated. RADIATION DOSE REDUCTION: This exam was performed according to the departmental dose-optimization program which includes automated exposure control, adjustment of the mA and/or kV according to patient size and/or use of iterative reconstruction technique. COMPARISON:  CT head 04/21/2021 FINDINGS: CT HEAD FINDINGS Brain: No evidence of acute infarction, hemorrhage, hydrocephalus, extra-axial collection or mass lesion/mass effect. Vascular: Negative for hyperdense vessel Skull: Negative Sinuses/Orbits: Paranasal sinuses clear.  Negative orbit Other: None CT CERVICAL SPINE FINDINGS Alignment: Mild anterolisthesis C3-4. Mild retrolisthesis C5-6 and C6-7 Skull base and vertebrae: Negative for fracture Soft tissues and spinal canal: Negative for soft tissue mass or edema Disc levels: Multilevel disc degeneration and spurring most prominent C4 through C7. Mild spinal and foraminal stenosis. Upper chest: Lung apices clear bilaterally Other: None IMPRESSION: 1. Negative CT head 2. Cervical spondylosis.  Negative for fracture Electronically Signed   By: Franchot Gallo M.D.   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 Radiology: ordered and independent interpretation performed. Decision-making details documented in ED Course.  Risk Prescription drug management. Parenteral controlled substances.   Iv ns. Continuous pulse ox and cardiac monitoring. Imaging ordered.   Reviewed nursing notes and prior charts for additional history. External reports reviewed.   Pt indicates concerned about 'stroke'. Imaging ordered. Reassured that exam appears more c/w msk pain/spasm/torticollis.   Cardiac monitor: sinus rhythm, rate 65.  CT reviewed/interpreted by me - no fx, no hem.   No meds pta for pain.   Dilaudid iv. Heat  to sore area.   Robaxin po.  Pt appears comfortable and stable for d/c.   Rec pcp f/u.  Return precautions provided.          Final Clinical Impression(s) / ED Diagnoses Final diagnoses:  None    Rx / DC Orders ED Discharge Orders     None         Lajean Saver, MD 05/09/22 [id number]

## 2022-05-09 NOTE — Discharge Instructions (Addendum)
It was our pleasure to provide your ER care today - we hope that you feel better.  Try heat therapy, and very gentle massage/stretching to area.   Take acetaminophen as need for pain. You may also take robaxin as need for muscle pain/spasm - no driving for the next 6 hours, or when taking robaxin.   Follow up with primary care doctor in one week if symptoms fail to improve/resolve.  Also follow up with your doctor regarding your blood pressure which is high today.  Return to ER if worse, new symptoms, intractable pain, numbness/weakness, or other concern.

## 2022-05-09 NOTE — ED Notes (Signed)
Wasted with Surveyor, quantity, 0.5 mg Dilaudid.

## 2022-05-10 ENCOUNTER — Telehealth: Payer: Self-pay

## 2022-05-10 NOTE — Telephone Encounter (Signed)
Transition Care Management Follow-up Telephone Call Date of discharge and from where: [facility] 05[facility]09[facility]22 How have you been since you were released from the hospital? "I am feeling alright, the medication that they gave me at the hospital helped". Any questions or concerns? No  Items Reviewed: Did the pt receive and understand the discharge instructions provided? Yes  Medications obtained and verified? No -Is picking up Robaxin today Other? No  Any new allergies since your discharge? No  Dietary orders reviewed? Yes Do you have support at home? Yes   Home Care and Equipment/Supplies: Were home health services ordered? no If so, what is the name of the agency? N/A  Has the agency set up a time to come to the patient's home? no Were any new equipment or medical supplies ordered?  No What is the name of the medical supply agency? N/A Were you able to get the supplies/equipment? not applicable Do you have any questions related to the use of the equipment or supplies? No  Functional Questionnaire: (I = Independent and D = Dependent) ADLs: I  Bathing/Dressing- I  Meal Prep- I  Eating- I  Maintaining continence- I  Transferring/Ambulation- I  Managing Meds- I  Follow up appointments reviewed:  PCP Hospital f/u appt confirmed? No  Patient calling clinic today [facility] f/u appt confirmed? Yes  Scheduled to see Roderic Palau NP on 05/19/22 @ 10:00am.. Are transportation arrangements needed? No  If their condition worsens, is the pt aware to call PCP or go to the Emergency Dept.? Yes Was the patient provided with contact information for the PCP's office or ED? Yes Was to pt encouraged to call back with questions or concerns? Yes

## 2022-05-19 ENCOUNTER — Ambulatory Visit (HOSPITAL_COMMUNITY)
Admission: RE | Admit: 2022-05-19 | Discharge: 2022-05-19 | Disposition: A | Discharge status: Home / Self Care | Payer: Medicare Other | Source: Ambulatory Visit | Attending: Nurse Practitioner | Admitting: Nurse Practitioner

## 2022-05-19 ENCOUNTER — Encounter (HOSPITAL_COMMUNITY): Payer: Self-pay | Admitting: Nurse Practitioner

## 2022-05-19 VITALS — BP 164/86 | HR 69 | Ht 66.0 in | Wt 133.4 lb

## 2022-05-19 DIAGNOSIS — D6869 Other thrombophilia: Secondary | ICD-10-CM

## 2022-05-19 DIAGNOSIS — Z7901 Long term (current) use of anticoagulants: Secondary | ICD-10-CM | POA: Diagnosis not present

## 2022-05-19 DIAGNOSIS — I480 Paroxysmal atrial fibrillation: Secondary | ICD-10-CM

## 2022-05-19 DIAGNOSIS — J45909 Unspecified asthma, uncomplicated: Secondary | ICD-10-CM | POA: Insufficient documentation

## 2022-05-19 NOTE — Progress Notes (Signed)
Primary Care Physician: Shelby Pou, MD Referring Physician:ER f/u EP: [name] is a 71 y.o. female with a h/o paroxysmal afib, s/p ablation, 11/2015, asthma seasonal allergies, that is in the afib clinic for f/u.  Currently she is not having any problems, no afib awareness and is doing well staying in SR. She had a recent colonoscopy with some bleeding post procedure. It sounds that she started a new med for H. Pylori today as she has had  a lot of stomach pain/issues recently. She had covid around one month ago.   F/u in afib clinic 9/27. She reports that she has not noted any afib since her ablation in 2019. She had a ER visit recently for neck strain and was placed on a muscle relaxer.   Today, she denies symptoms of palpitations, chest pain, shortness of breath, orthopnea, PND, lower extremity edema, dizziness, presyncope, syncope, or neurologic sequela. The patient is tolerating medications without difficulties and is otherwise without complaint today.   Past Medical History:  Diagnosis Date   Asthma in adult, mild intermittent, uncomplicated    Chronic anticoagulation    Chronic constipation    COVID-19 virus infection 10/19/2021   Eczema    GERD (gastroesophageal reflux disease)    H. pylori infection 09/24/2021   Identified on EGD biopsy.  Treatment will be discussed with her GI doctor.  SHe will need a very simple regimen which has no contraindications in combination with her existing medication regimen.     Hemoperitoneum 09/27/2021   Due to splenic lac occurring after colonoscopy on 09/24/21.  Xarelto resumed.    History of kidney stones    Hx of vaginal bleeding, resolved, ultrasound completed 10/11/2018   Hypertension    Laceration of spleen    Presumed complication of colonoscopy 09/24/21.  Pain and hemoperitoneum.  Xarelto resumed.  Short term opioid pain management.     Personal history of atrial fibrillation, s/p ablation, now in NSR but remains on  anticoaulation 11/26/2015   Followed in cardiology clinic    Past Surgical History:  Procedure Laterality Date   ATRIAL FIBRILLATION ABLATION N/A 09/30/2017   Procedure: [place];  Surgeon: Thompson Grayer, MD;  Location: [facility];  Service: Cardiovascular;  Laterality: N/A;   BIOPSY  09/24/2021   Procedure: BIOPSY;  Surgeon: Sharyn Creamer, MD;  Location: [facility];  Service: Gastroenterology;;   CHOLECYSTECTOMY N/A 12/08/2018   Procedure: LAPAROSCOPIC CHOLECYSTECTOMY WITH INTRAOPERATIVE CHOLANGIOGRAM;  Surgeon: Jovita Kussmaul, MD;  Location: [place];  Service: General;  Laterality: N/A;   COLONOSCOPY WITH PROPOFOL N/A 09/24/2021   Procedure: COLONOSCOPY WITH PROPOFOL;  Surgeon: Sharyn Creamer, MD;  Location: [place];  Service: Gastroenterology;  Laterality: N/A;   ERCP N/A 12/09/2018   Procedure: ENDOSCOPIC RETROGRADE CHOLANGIOPANCREATOGRAPHY (ERCP);  Surgeon: Carol Ada, MD;  Location: [place];  Service: Endoscopy;  Laterality: N/A;   ESOPHAGOGASTRODUODENOSCOPY (EGD) WITH PROPOFOL N/A 09/24/2021   Procedure: ESOPHAGOGASTRODUODENOSCOPY (EGD) WITH PROPOFOL;  Surgeon: Sharyn Creamer, MD;  Location: [place];  Service: Gastroenterology;  Laterality: N/A;   POLYPECTOMY  09/24/2021   Procedure: POLYPECTOMY;  Surgeon: Sharyn Creamer, MD;  Location: [facility];  Service: Gastroenterology;;   Joan Mayans  12/09/2018   Procedure: Joan Mayans;  Surgeon: Carol Ada, MD;  Location: [place];  Service: Endoscopy;;   TONSILLECTOMY     TUBAL LIGATION      Current Outpatient Medications  Medication Sig Dispense Refill   alendronate (FOSAMAX)  70 MG tablet Take 1 tablet (70 mg total) by mouth every 7 (seven) days. Take with a full glass of water on an empty stomach. 12 tablet 3   atorvastatin (LIPITOR) 40 MG tablet Take 1 tablet (40 mg total) by mouth daily. 90 tablet 3   cetirizine (ZYRTEC) 10 MG tablet Take 10 mg by mouth daily.     dicyclomine (BENTYL)  10 MG capsule Take 1 capsule (10 mg total) by mouth every 8 (eight) hours as needed (abdominal pain). 30 capsule 0   diltiazem (CARDIZEM) 60 MG tablet Take 1 tablet (60 mg total) by mouth every 6 (six) hours as needed (afib). 45 tablet 1   diltiazem (CARTIA XT) 120 MG 24 hr capsule Take 1 capsule (120 mg total) by mouth 2 (two) times daily. 180 capsule 2   docusate sodium (COLACE) 100 MG capsule Take 1 capsule (100 mg total) by mouth daily. (Patient taking differently: Take 100 mg by mouth daily as needed for mild constipation.) 14 capsule 0   ezetimibe (ZETIA) 10 MG tablet Take 1 tablet (10 mg total) by mouth daily. 30 tablet 11   fluticasone (FLOVENT HFA) 44 MCG/ACT inhaler Inhale 1 puff into the lungs daily. 30.8 g 1   HYDROcodone-acetaminophen (NORCO/VICODIN) 5-325 MG tablet 1/2 to 1 tablet only when needed for severe pain, as often as every 6 hours. 30 tablet 0   levalbuterol (XOPENEX HFA) 45 MCG/ACT inhaler Inhale 1 puff into the lungs every 6 (six) hours as needed for shortness of breath. 1 each 5   losartan (COZAAR) 50 MG tablet Take 2 tablets (100 mg total) by mouth daily. 135 tablet 3   methocarbamol (ROBAXIN) 750 MG tablet Take 1 tablet (750 mg total) by mouth 3 (three) times daily as needed (muscle spasm/pain). 15 tablet 0   montelukast (SINGULAIR) 10 MG tablet Take 1 tablet (10 mg total) by mouth at bedtime. 90 tablet 3   omeprazole (PRILOSEC) 40 MG capsule Take 1 capsule (40 mg total) by mouth in the morning and at bedtime. 90 capsule 1   ondansetron (ZOFRAN) 4 MG tablet Take 1 tablet (4 mg total) by mouth every 4 (four) hours as needed for nausea or vomiting. 8 tablet 0   polyethylene glycol (MIRALAX / GLYCOLAX) 17 g packet Take 17 g by mouth 2 (two) times daily. 14 each 0   potassium chloride (KLOR-CON M) 10 MEQ tablet Take 1 tablet (10 mEq total) by mouth daily. 60 tablet 0   rivaroxaban (XARELTO) 20 MG TABS tablet Take 1 tablet (20 mg total) by mouth daily with supper. 30 tablet 11    simethicone (GAS-X) 80 MG chewable tablet Chew 1 tablet (80 mg total) by mouth 4 (four) times daily as needed for up to 7 days for flatulence. 28 tablet 0   tiotropium (SPIRIVA) 18 MCG inhalation capsule Place 1 capsule (18 mcg total) into inhaler and inhale daily. 90 capsule 3   No current facility-administered medications for this encounter.    Allergies  Allergen Reactions   Albuterol Palpitations    Caused afib    Percocet [Oxycodone-Acetaminophen] Shortness Of Breath, Itching and Anxiety    Makes jittery, difficulty breathing, itching   Celecoxib Rash   Tramadol Palpitations    Ok to trial again 09/29/2021 and f/u HR    Social History   Socioeconomic History   Marital status: Single    Spouse name: Not on file   Number of children: Not on file   Years of education: Not  on file   Highest education level: Not on file  Occupational History   Occupation: Retired  Tobacco Use   Smoking status: Former    Types: Cigarettes    Quit date: 10/24/2015    Years since quitting: 6.5   Smokeless tobacco: Never  Vaping Use   Vaping Use: Never used  Substance and Sexual Activity   Alcohol use: Not Currently   Drug use: Not Currently    Types: Marijuana    Comment: Smoked marijuana in the past.   Sexual activity: Yes    Partners: Male    Birth control/protection: Post-menopausal  Other Topics Concern   Not on file  Social History Narrative   Lives alone in [place], [place] and has 2 daughters who are within driving distance   Enjoys watching soap operas, reading, exercise   Social Determinants of Health   Financial Resource Strain: Not on file  Food Insecurity: No Food Insecurity (04/05/2022)   Hunger Vital Sign    Worried About Running Out of Food in the Last Year: Never true    [place] in the Last Year: Never true  Transportation Needs: No Transportation Needs (05/10/2022)   PRAPARE - Hydrologist (Medical): No    Lack of Transportation  (Non-Medical): No  Physical Activity: Not on file  Stress: Not on file  Social Connections: Not on file  Intimate Partner Violence: Not on file    Family History  Problem Relation Age of Onset   Heart attack Mother        Young age   Breast cancer Sister        Late 72s; now s/p mastectomy    Lung cancer Brother        x 2   Cancer Sister    Cancer Sister    Cancer Sister    Colon cancer Neg Hx    Colon polyps Neg Hx    Esophageal cancer Neg Hx    Rectal cancer Neg Hx    Stomach cancer Neg Hx     ROS- All systems are reviewed and negative except as per the HPI above  Physical Exam: Vitals:   05/19/22 0948  BP: (!) 164/86  Pulse: 69  Weight: 60.5 kg  Height: '5\' 6"'$  (1.676 m)   Wt Readings from Last 3 Encounters:  05/19/22 60.5 kg  01/21/22 60 kg  01/13/22 61.2 kg    Labs: Lab Results  Component Value Date   NA 139 04/18/2022   K 3.2 (L) 04/18/2022   CL 107 04/18/2022   CO2 24 04/18/2022   GLUCOSE 101 (H) 04/18/2022   BUN 9 04/18/2022   CREATININE 0.78 04/18/2022   CALCIUM 9.4 04/18/2022   PHOS 3.0 04/15/2017   MG 1.9 08/29/2021   Lab Results  Component Value Date   INR 2.4 (H) 10/09/2021   Lab Results  Component Value Date   CHOL 227 (H) 04/16/2021   HDL 62 04/16/2021   LDLCALC 140 (H) 04/16/2021   TRIG 143 04/16/2021     GEN- The patient is well appearing, alert and oriented x 3 today.   Head- normocephalic, atraumatic Eyes-  Sclera clear, conjunctiva pink Ears- hearing intact Oropharynx- clear Neck- supple, no JVP Lymph- no cervical lymphadenopathy Lungs- Clear to ausculation bilaterally, normal work of breathing Heart- Regular rate and rhythm, no murmurs, rubs or gallops, PMI not laterally displaced GI- soft, NT, ND, + BS Extremities- no clubbing, cyanosis, or edema MS- no significant  deformity or atrophy Skin- no rash or lesion Psych- euthymic mood, full affect Neuro- strength and sensation are intact  EKG- Vent. rate 69 BPM PR  interval 132 ms QRS duration 84 ms QT/QTcB 410/439 ms P-R-T axes 78 67 66 Normal sinus rhythm Normal ECG When compared with ECG of 13-Jan-2022 08:42, PREVIOUS ECG IS PRESENT   Assessment and Plan: 1. Paroxysmal afib S/p ablation 2017 No afib issues  Maintaining  SR  Continue  diltiazem  120 mg bid  Continue xarelto 20 mg daily for a CHA2DS2VASc score of 3  F/u here in 6 months F/u with PCP as scheduled  [name], [facility] [address] [place], [place] [address] [phone]

## 2022-05-22 IMAGING — DX DG CHEST 2V
2 series · 2 of 2 positions shown · non-contrast
Comparison: 09/12/2020.

CLINICAL DATA: Chest pain.

EXAM:
CHEST - 2 VIEW

[chest pa]
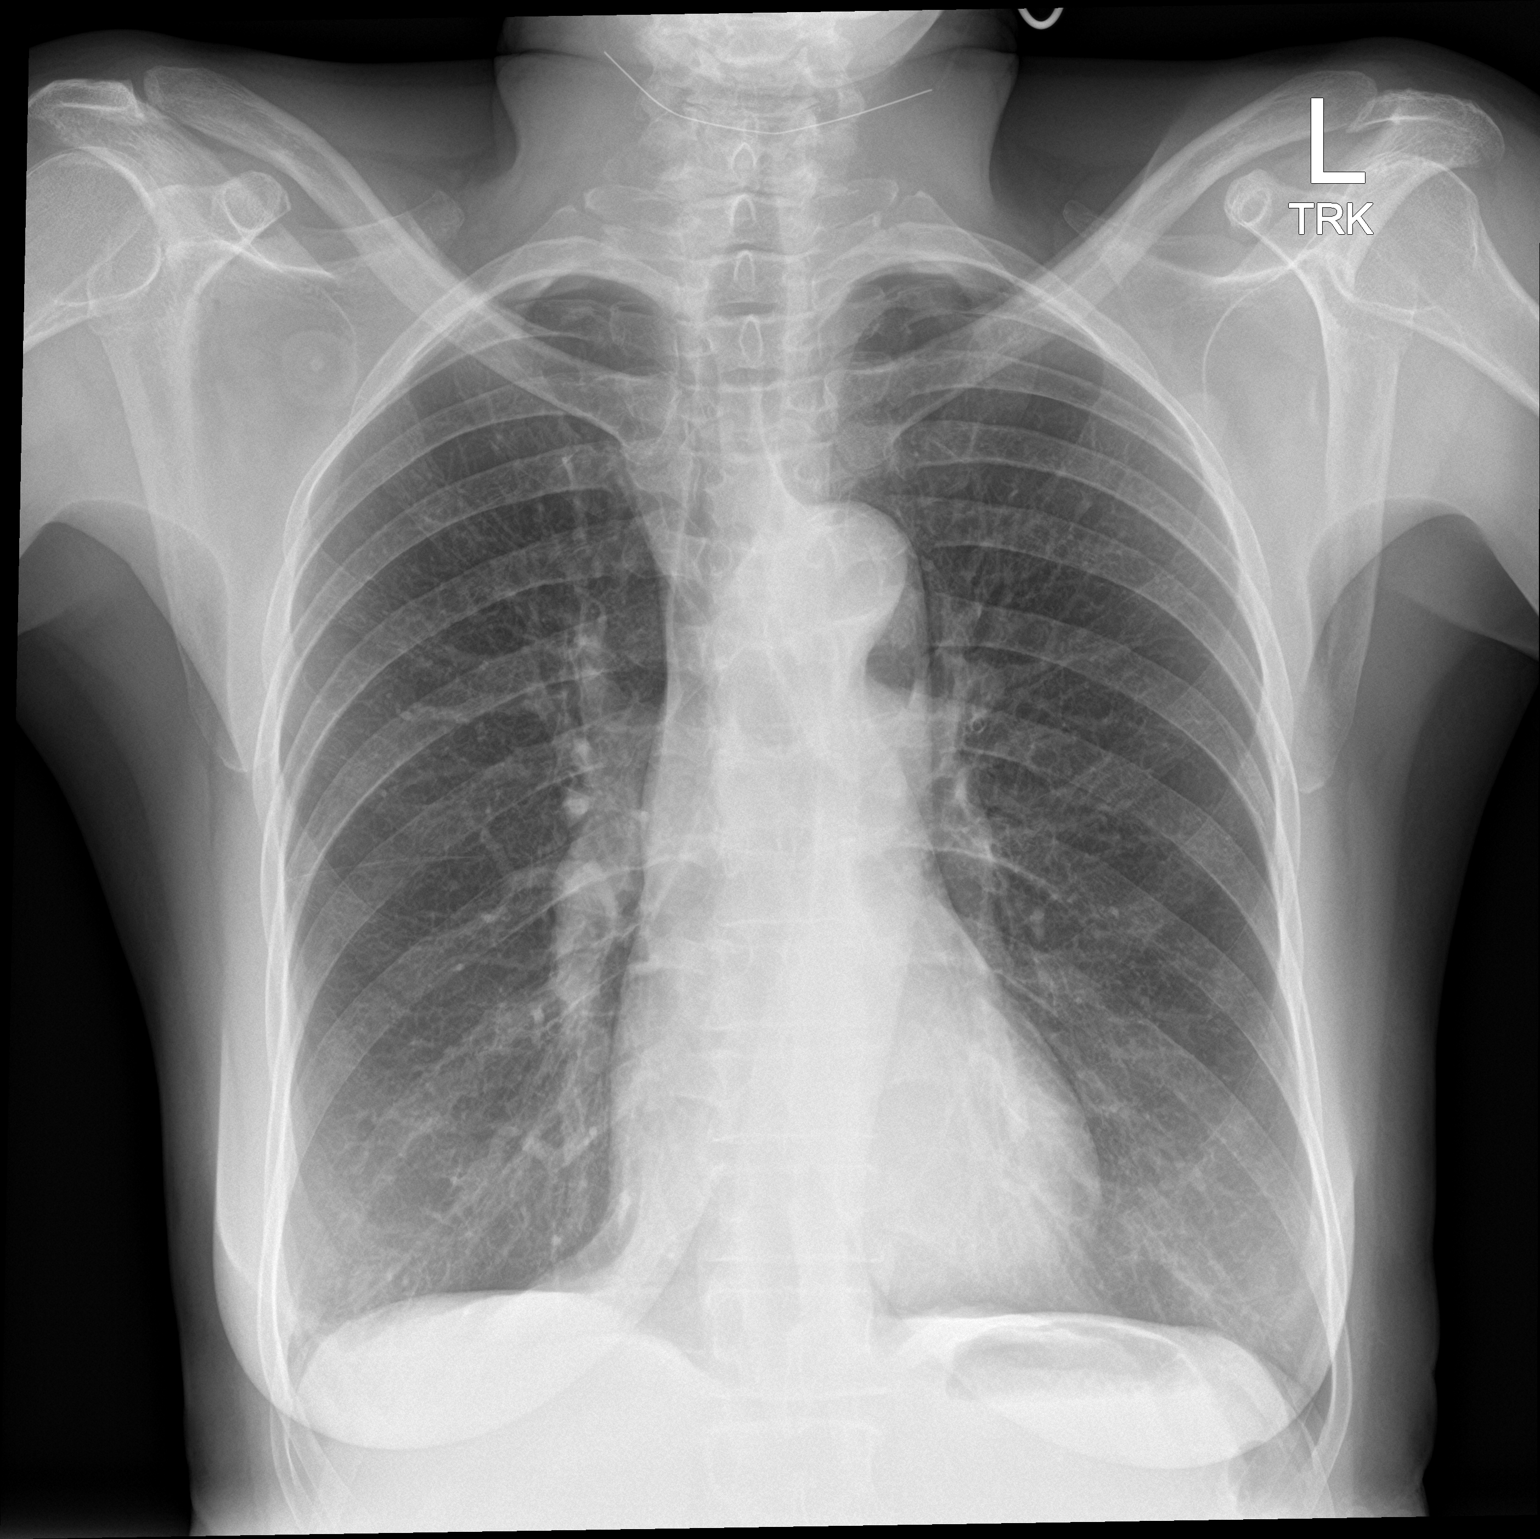

[chest lat]
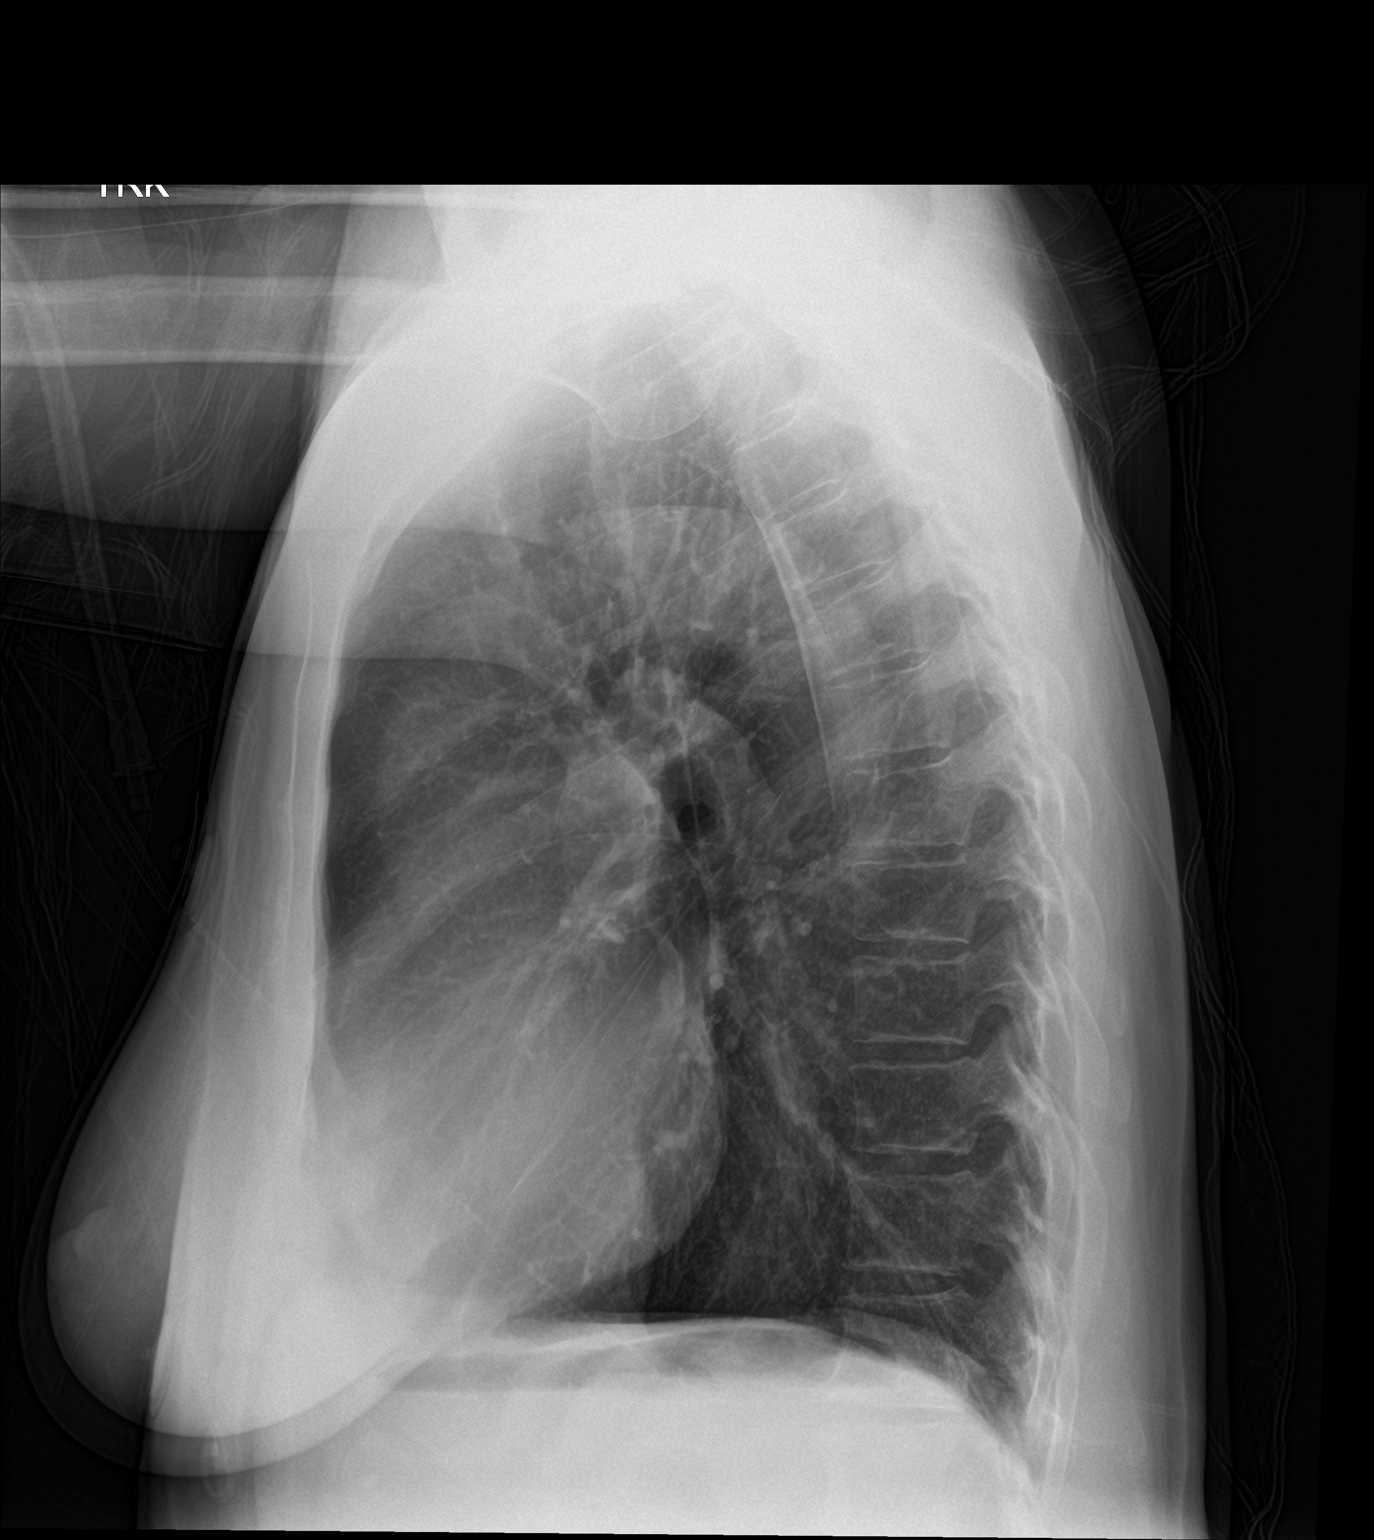

[2 of 2 positions shown; findings below may reference images not displayed]

FINDINGS: The heart size and mediastinal contours are within normal limits.
Both lungs are clear. No visible pleural effusions or pneumothorax.
No acute osseous abnormality.
IMPRESSION: No evidence of acute cardiopulmonary disease.

## 2022-05-28 ENCOUNTER — Telehealth: Payer: Self-pay | Admitting: Gastroenterology

## 2022-05-28 DIAGNOSIS — G8929 Other chronic pain: Secondary | ICD-10-CM

## 2022-05-28 MED ORDER — DICYCLOMINE HCL 10 MG PO CAPS: 10.0000 mg | ORAL_CAPSULE | Freq: Three times a day (TID) | ORAL | 3 refills | Status: DC | PRN

## 2022-05-28 NOTE — Telephone Encounter (Signed)
Patient is requesting a refill of dicyclomine to [facility]. Prescription refill sent to her pharmacy.

## 2022-05-28 NOTE — Telephone Encounter (Signed)
Patient called requesting a refill on a purple pill medication does not recall the name to help her have a BM.

## 2022-05-31 ENCOUNTER — Telehealth: Payer: Self-pay

## 2022-05-31 ENCOUNTER — Other Ambulatory Visit: Payer: Self-pay | Admitting: *Deleted

## 2022-05-31 DIAGNOSIS — S36039D Unspecified laceration of spleen, subsequent encounter: Secondary | ICD-10-CM

## 2022-05-31 MED ORDER — HYDROCODONE-ACETAMINOPHEN 5-325 MG PO TABS: ORAL_TABLET | ORAL | 0 refills | Status: DC

## 2022-05-31 NOTE — Telephone Encounter (Signed)
HYDROcodone-acetaminophen (NORCO/VICODIN) 5-325 MG tablet, refill request @  [id number] - [place], [place] - [place].

## 2022-05-31 NOTE — Telephone Encounter (Signed)
Last appointment 01/21/2022.  Next appointment 07/01/2022.  No [facility]  seen.

## 2022-06-04 NOTE — Telephone Encounter (Signed)
Rx was sent on 10/9 to begin 10/16.

## 2022-06-08 ENCOUNTER — Other Ambulatory Visit: Payer: Self-pay | Admitting: *Deleted

## 2022-06-08 ENCOUNTER — Telehealth: Payer: Self-pay | Admitting: Internal Medicine

## 2022-06-08 NOTE — Telephone Encounter (Unsigned)
Pt requesting a Refill   lactulose (CHRONULAC) 10 GM/15ML solution   potassium chloride (KLOR-CON M) 10 MEQ tablet    [id number] - [place], [place] [address]

## 2022-06-09 ENCOUNTER — Other Ambulatory Visit: Payer: Self-pay

## 2022-06-09 DIAGNOSIS — I10 Essential (primary) hypertension: Secondary | ICD-10-CM

## 2022-06-09 MED ORDER — LOSARTAN POTASSIUM 50 MG PO TABS: 100.0000 mg | ORAL_TABLET | Freq: Every day | ORAL | 3 refills | Status: DC

## 2022-06-09 MED ORDER — DOCUSATE SODIUM 100 MG PO CAPS: 100.0000 mg | ORAL_CAPSULE | Freq: Every day | ORAL | 2 refills | Status: DC | PRN

## 2022-06-09 MED ORDER — POTASSIUM CHLORIDE CRYS ER 10 MEQ PO TBCR
10.0000 meq | EXTENDED_RELEASE_TABLET | Freq: Every day | ORAL | 0 refills | Status: DC
Start: 2022-06-09 — End: 2022-08-09

## 2022-06-09 MED ORDER — LINACLOTIDE 145 MCG PO CAPS: 145.0000 ug | ORAL_CAPSULE | Freq: Every day | ORAL | 1 refills | Status: DC

## 2022-06-09 NOTE — Telephone Encounter (Signed)
Dr. Havery Moros,  Patient is requesting a refill of Linzess. Is it ok to refill?

## 2022-06-09 NOTE — Telephone Encounter (Signed)
Prescription for Linzess sent to patient's pharmacy.

## 2022-06-09 NOTE — Telephone Encounter (Signed)
Yes okay to refill. Thanks 

## 2022-06-09 NOTE — Addendum Note (Signed)
Addended by: Kerin Perna on: 06/09/2022 03:17 PM   Modules accepted: Orders

## 2022-06-09 NOTE — Telephone Encounter (Signed)
Patient requesting rx to help her use the bathroom. Cant recall the name of prescription but says Dr. Havery Moros prescribed it to her before. Please advise.

## 2022-06-09 NOTE — Addendum Note (Signed)
Addended by: Dorisann Frames L on: 06/09/2022 01:39 PM   Modules accepted: Orders

## 2022-06-10 ENCOUNTER — Emergency Department (HOSPITAL_COMMUNITY)
Admission: EM | Admit: 2022-06-10 | Discharge: 2022-06-11 | Disposition: A | Discharge status: Home / Self Care | Payer: Medicare Other | Attending: Emergency Medicine | Admitting: Emergency Medicine

## 2022-06-10 ENCOUNTER — Other Ambulatory Visit: Payer: Self-pay

## 2022-06-10 ENCOUNTER — Emergency Department (HOSPITAL_COMMUNITY): Payer: Medicare Other

## 2022-06-10 DIAGNOSIS — R0789 Other chest pain: Secondary | ICD-10-CM | POA: Diagnosis not present

## 2022-06-10 DIAGNOSIS — M545 Low back pain, unspecified: Secondary | ICD-10-CM | POA: Diagnosis not present

## 2022-06-10 DIAGNOSIS — R079 Chest pain, unspecified: Secondary | ICD-10-CM | POA: Diagnosis not present

## 2022-06-10 DIAGNOSIS — I700 Atherosclerosis of aorta: Secondary | ICD-10-CM | POA: Diagnosis not present

## 2022-06-10 DIAGNOSIS — K5900 Constipation, unspecified: Secondary | ICD-10-CM | POA: Diagnosis not present

## 2022-06-10 DIAGNOSIS — J9811 Atelectasis: Secondary | ICD-10-CM | POA: Diagnosis not present

## 2022-06-10 DIAGNOSIS — R109 Unspecified abdominal pain: Secondary | ICD-10-CM | POA: Diagnosis present

## 2022-06-10 DIAGNOSIS — R1030 Lower abdominal pain, unspecified: Secondary | ICD-10-CM | POA: Diagnosis not present

## 2022-06-10 DIAGNOSIS — R1032 Left lower quadrant pain: Secondary | ICD-10-CM | POA: Diagnosis not present

## 2022-06-10 LAB — CBC
HCT: 35 % — ABNORMAL LOW (ref 36.0–46.0)
Hemoglobin: 11.4 g/dL — ABNORMAL LOW (ref 12.0–15.0)
MCH: 26.4 pg (ref 26.0–34.0)
MCHC: 32.6 g/dL (ref 30.0–36.0)
MCV: 81 fL (ref 80.0–100.0)
Platelets: 195 10*3/uL (ref 150–400)
RBC: 4.32 MIL/uL (ref 3.87–5.11)
RDW: 15.6 % — ABNORMAL HIGH (ref 11.5–15.5)
WBC: 4.4 10*3/uL (ref 4.0–10.5)
nRBC: 0 % (ref 0.0–0.2)

## 2022-06-10 LAB — URINALYSIS, ROUTINE W REFLEX MICROSCOPIC
Bacteria, UA: NONE SEEN
Bilirubin Urine: NEGATIVE
Glucose, UA: NEGATIVE mg/dL
Ketones, ur: NEGATIVE mg/dL
Leukocytes,Ua: NEGATIVE
Nitrite: NEGATIVE
Protein, ur: NEGATIVE mg/dL
Specific Gravity, Urine: 1.027 (ref 1.005–1.030)
pH: 8 (ref 5.0–8.0)

## 2022-06-10 LAB — HEPATIC FUNCTION PANEL
ALT: 20 U/L (ref 0–44)
AST: 19 U/L (ref 15–41)
Albumin: 4 g/dL (ref 3.5–5.0)
Alkaline Phosphatase: 66 U/L (ref 38–126)
Bilirubin, Direct: <0.1 mg/dL (ref 0.0–0.2)
Total Bilirubin: 0.6 mg/dL (ref 0.3–1.2)
Total Protein: 7.6 g/dL (ref 6.5–8.1)

## 2022-06-10 LAB — BASIC METABOLIC PANEL
Anion gap: 9 (ref 5–15)
BUN: 11 mg/dL (ref 8–23)
CO2: 25 mmol/L (ref 22–32)
Calcium: 9.4 mg/dL (ref 8.9–10.3)
Chloride: 103 mmol/L (ref 98–111)
Creatinine, Ser: 0.73 mg/dL (ref 0.44–1.00)
GFR, Estimated: >60 mL/min (ref >60)
Glucose, Bld: 97 mg/dL (ref 70–99)
Potassium: 3.2 mmol/L — ABNORMAL LOW (ref 3.5–5.1)
Sodium: 137 mmol/L (ref 135–145)

## 2022-06-10 LAB — TROPONIN I (HIGH SENSITIVITY)
Troponin I (High Sensitivity): 5 ng/L (ref <18)
Troponin I (High Sensitivity): 6 ng/L (ref <18)

## 2022-06-10 LAB — LIPASE, BLOOD: Lipase: 27 U/L (ref 11–51)

## 2022-06-10 MED ORDER — SENNOSIDES-DOCUSATE SODIUM 8.6-50 MG PO TABS: 1.0000 | ORAL_TABLET | Freq: Two times a day (BID) | ORAL | 0 refills | Status: AC

## 2022-06-10 MED ORDER — ACETAMINOPHEN 500 MG PO TABS
1000.0000 mg | ORAL_TABLET | Freq: Once | ORAL | Status: AC
  Administered 2022-06-10: 1000 mg via ORAL
  Filled 2022-06-10: qty 2

## 2022-06-10 MED ORDER — LACTATED RINGERS IV BOLUS
1000.0000 mL | Freq: Once | INTRAVENOUS | Status: AC
  Administered 2022-06-10: 1000 mL via INTRAVENOUS

## 2022-06-10 MED ORDER — IOHEXOL 350 MG/ML SOLN
75.0000 mL | Freq: Once | INTRAVENOUS | Status: AC | PRN
  Administered 2022-06-10: 75 mL via INTRAVENOUS

## 2022-06-10 MED ORDER — POTASSIUM CHLORIDE CRYS ER 20 MEQ PO TBCR
40.0000 meq | EXTENDED_RELEASE_TABLET | Freq: Once | ORAL | Status: AC
  Administered 2022-06-10: 40 meq via ORAL
  Filled 2022-06-10: qty 2

## 2022-06-10 MED ORDER — LACTULOSE 20 GM/30ML PO SOLN: 20.0000 g | Freq: Every day | ORAL | 0 refills | Status: AC

## 2022-06-10 MED ORDER — MORPHINE SULFATE (PF) 2 MG/ML IV SOLN
2.0000 mg | Freq: Once | INTRAVENOUS | Status: AC
  Administered 2022-06-10: 2 mg via INTRAVENOUS
  Filled 2022-06-10: qty 1

## 2022-06-10 MED ORDER — POLYETHYLENE GLYCOL 3350 17 G PO PACK: 17.0000 g | PACK | Freq: Two times a day (BID) | ORAL | 0 refills | Status: DC

## 2022-06-10 MED ORDER — LACTULOSE 10 GM/15ML PO SOLN
20.0000 g | Freq: Once | ORAL | Status: AC
  Administered 2022-06-10: 20 g via ORAL
  Filled 2022-06-10: qty 30

## 2022-06-10 MED ORDER — FLEET ENEMA 7-19 GM/118ML RE ENEM: 1.0000 | ENEMA | Freq: Once | RECTAL | Status: DC

## 2022-06-10 NOTE — Discharge Instructions (Signed)
  You have been seen in the Emergency Department (ED) for abdominal pain. Your workup was generally reassuring; and your CT scan showed large volume stool and constipation causing your pain.  Take the lactulose daily as prescribed as well as MiraLAX 2 times a day and Senokot 2 times a day to help with your constipation.  Make sure you are drinking plenty of fluids.  You can take Tylenol and ibuprofen as needed for pain.  Please follow up with your primary care doctor as soon as possible regarding today's ED visit and the symptoms that are bothering you.  Return to the ED if your abdominal pain worsens or fails to improve, you develop bloody vomiting, bloody diarrhea, you are unable to tolerate fluids due to vomiting, fever greater than 101, or any other concerning symptoms.

## 2022-06-10 NOTE — ED Notes (Signed)
Patient ambulated to the bathroom with a steady gait. Patient aware that urine sample is needed at this time.

## 2022-06-10 NOTE — ED Provider Notes (Signed)
[facility] EMERGENCY DEPARTMENT Provider Note   CSN: [id number] Arrival date & time: 06/10/22  0744     History  Chief Complaint  Patient presents with   Constipation   Abdominal Pain   Back Pain   Chest Pain    [name] is a 71 y.o. female.  With PMH of A-fib on Xarelto, HTN, kidney stones, chronic constipation, cholecystectomy who presents with lower abdominal pain, decreased bowel movements and fever today.  She says she typically has bowel movements every 2 days but has not had one in the past 4 days.  She is still passing gas and tolerating p.o. without nausea or vomiting.  She has had no fevers at home but did have a mild low-grade fever coming here today.  Of note, she did just have her COVID booster yesterday.  She denies any urinary symptoms or flank pain.  She has had no bloody bowel movements.  She has been taking MiraLAX and stool softeners at home without relief.   Constipation Associated symptoms: abdominal pain and back pain   Abdominal Pain Associated symptoms: chest pain and constipation   Back Pain Associated symptoms: abdominal pain and chest pain   Chest Pain Associated symptoms: abdominal pain and back pain        Home Medications Prior to Admission medications   Medication Sig Start Date End Date Taking? Authorizing Provider  alendronate (FOSAMAX) 70 MG tablet Take 1 tablet (70 mg total) by mouth every 7 (seven) days. Take with a full glass of water on an empty stomach. 06/09/21 06/10/22 Yes Angelica Pou, MD  atorvastatin (LIPITOR) 40 MG tablet Take 1 tablet (40 mg total) by mouth daily. 08/27/21 08/27/22 Yes Angelica Pou, MD  cetirizine (ZYRTEC) 10 MG tablet Take 10 mg by mouth daily.   Yes [provider]  dicyclomine (BENTYL) 10 MG capsule Take 1 capsule (10 mg total) by mouth every 8 (eight) hours as needed (abdominal pain). 05/28/22 06/27/22 Yes Armbruster, Carlota Raspberry, MD  diltiazem (CARDIZEM) 60 MG tablet  Take 1 tablet (60 mg total) by mouth every 6 (six) hours as needed (afib). 08/14/21  Yes Sherran Needs, NP  diltiazem (CARTIA XT) 120 MG 24 hr capsule Take 1 capsule (120 mg total) by mouth 2 (two) times daily. 05/03/22  Yes Sherran Needs, NP  ezetimibe (ZETIA) 10 MG tablet Take 1 tablet (10 mg total) by mouth daily. 12/07/21 12/07/22 Yes Angelica Pou, MD  fluticasone (FLOVENT HFA) 44 MCG/ACT inhaler Inhale 1 puff into the lungs daily. 09/08/21  Yes Angelica Pou, MD  HYDROcodone-acetaminophen (NORCO/VICODIN) 5-325 MG tablet 1/2 to 1 tablet only when needed for severe pain, as often as every 6 hours. Patient taking differently: Take 0.5-1 tablets by mouth every 6 (six) hours as needed for severe pain. 06/07/22  Yes Angelica Pou, MD  Lactulose 20 GM/30ML SOLN Take 30 mLs (20 g total) by mouth daily for 5 days. 06/10/22 06/15/22 Yes Elgie Congo, MD  levalbuterol [facility] HFA) 45 MCG/ACT inhaler Inhale 1 puff into the lungs every 6 (six) hours as needed for shortness of breath. 03/04/22  Yes Angelica Pou, MD  losartan (COZAAR) 50 MG tablet Take 2 tablets (100 mg total) by mouth daily. 06/09/22 06/09/23 Yes Angelica Pou, MD  montelukast (SINGULAIR) 10 MG tablet Take 1 tablet (10 mg total) by mouth at bedtime. 03/04/22  Yes Angelica Pou, MD  omeprazole (PRILOSEC) 40 MG capsule Take 1 capsule (  40 mg total) by mouth in the morning and at bedtime. 01/01/22  Yes Iona Beard, MD  polyethylene glycol (MIRALAX / GLYCOLAX) 17 g packet Take 17 g by mouth 2 (two) times daily. 09/29/21  Yes Timothy Lasso, MD  polyethylene glycol (MIRALAX) 17 g packet Take 17 g by mouth 2 (two) times daily for 14 days. 06/10/22 06/24/22 Yes Elgie Congo, MD  potassium chloride (KLOR-CON M) 10 MEQ tablet Take 1 tablet (10 mEq total) by mouth daily. 06/09/22  Yes Angelica Pou, MD  rivaroxaban (XARELTO) 20 MG TABS tablet Take 1 tablet (20 mg total) by mouth daily with  supper. 01/12/22  Yes Sherran Needs, NP  senna-docusate (SENOKOT-S) 8.6-50 MG tablet Take 1 tablet by mouth 2 (two) times daily for 14 days. 06/10/22 06/24/22 Yes Elgie Congo, MD  simethicone (GAS-X) 80 MG chewable tablet Chew 1 tablet (80 mg total) by mouth 4 (four) times daily as needed for up to 7 days for flatulence. 01/01/22 06/10/22 Yes Iona Beard, MD  tiotropium (SPIRIVA) 18 MCG inhalation capsule Place 1 capsule (18 mcg total) into inhaler and inhale daily. 10/16/21  Yes Angelica Pou, MD  docusate sodium (COLACE) 100 MG capsule Take 1 capsule (100 mg total) by mouth daily as needed for up to 180 doses for mild constipation. Patient not taking: Reported on 06/10/2022 06/09/22   Angelica Pou, MD  linaclotide [facility]) 145 MCG CAPS capsule Take 1 capsule (145 mcg total) by mouth daily before breakfast. Patient not taking: Reported on 06/10/2022 06/09/22   Yetta Flock, MD  methocarbamol (ROBAXIN) 750 MG tablet Take 1 tablet (750 mg total) by mouth 3 (three) times daily as needed (muscle spasm/pain). Patient not taking: Reported on 06/10/2022 05/09/22   Lajean Saver, MD  ondansetron (ZOFRAN) 4 MG tablet Take 1 tablet (4 mg total) by mouth every 4 (four) hours as needed for nausea or vomiting. Patient not taking: Reported on 06/10/2022 04/18/22   Wynona Dove A, DO      Allergies    Albuterol, Percocet [oxycodone-acetaminophen], Celecoxib, and Tramadol    Review of Systems   Review of Systems  Cardiovascular:  Positive for chest pain.  Gastrointestinal:  Positive for abdominal pain and constipation.  Musculoskeletal:  Positive for back pain.    Physical Exam Updated Vital Signs BP (!) 140/74   Pulse 65   Temp 98.2 F (36.8 C) (Oral)   Resp 16   Ht '5\' 7"'$  (1.702 m)   Wt 61.2 kg   SpO2 98%   BMI 21.14 kg/m  Physical Exam Constitutional: Alert and oriented. Well appearing and in no distress. Eyes: Conjunctivae are normal. ENT      Head:  Normocephalic and atraumatic.      Nose: No congestion.      Mouth/Throat: Mucous membranes are moist.      Neck: No stridor. Cardiovascular: S1, S2, regular rate and rhythm Respiratory: Normal respiratory effort. Breath sounds are normal. Gastrointestinal: Soft and no distention with mild lower abdominal tenderness in the right lower quadrant suprapubic region and left lower quadrant worse on the left lower quadrant, no rebound or guarding Musculoskeletal: Normal range of motion in all extremities.      Right lower leg: No tenderness or edema.      Left lower leg: No tenderness or edema. Neurologic: Normal speech and language. No gross focal neurologic deficits are appreciated. Skin: Skin is warm, dry and intact. No rash noted. Psychiatric: Mood and affect are normal. Speech  and behavior are normal.  ED Results / Procedures / Treatments   Labs (all labs ordered are listed, but only abnormal results are displayed) Labs Reviewed  BASIC METABOLIC PANEL - Abnormal; Notable for the following components:      Result Value   Potassium 3.2 (*)    All other components within normal limits  CBC - Abnormal; Notable for the following components:   Hemoglobin 11.4 (*)    HCT 35.0 (*)    RDW 15.6 (*)    All other components within normal limits  URINALYSIS, ROUTINE W REFLEX MICROSCOPIC - Abnormal; Notable for the following components:   Color, Urine STRAW (*)    Hgb urine dipstick SMALL (*)    All other components within normal limits  LIPASE, BLOOD  HEPATIC FUNCTION PANEL  TROPONIN I (HIGH SENSITIVITY)  TROPONIN I (HIGH SENSITIVITY)    EKG EKG Interpretation  Date/Time:  Thursday June 10 2022 08:27:30 EDT Ventricular Rate:  78 PR Interval:  146 QRS Duration: 92 QT Interval:  392 QTC Calculation: 446 R Axis:   34 Text Interpretation: Normal sinus rhythm Normal ECG When compared with ECG of 19-May-2022 10:20, PREVIOUS ECG IS PRESENT Confirmed by Georgina Snell [id number]) on  06/10/2022 6:37:19 PM  Radiology CT ABDOMEN PELVIS W CONTRAST  Result Date: 06/10/2022 CLINICAL DATA:  Lower abdominal pain, left greater than right EXAM: CT ABDOMEN AND PELVIS WITH CONTRAST TECHNIQUE: Multidetector CT imaging of the abdomen and pelvis was performed using the standard protocol following bolus administration of intravenous contrast. RADIATION DOSE REDUCTION: This exam was performed according to the departmental dose-optimization program which includes automated exposure control, adjustment of the mA and/or kV according to patient size and/or use of iterative reconstruction technique. CONTRAST:  60m OMNIPAQUE IOHEXOL 350 MG/ML SOLN COMPARISON:  04/18/2022 FINDINGS: Lower chest: Scarring in the lung bases.  No acute findings. Hepatobiliary: Prior cholecystectomy. Mild intrahepatic and extrahepatic biliary ductal dilatation, stable since prior study likely related to age and post cholecystectomy state. Pancreas: No focal abnormality or ductal dilatation. Spleen: No focal abnormality.  Normal size. Adrenals/Urinary Tract: No adrenal abnormality. No focal renal abnormality. No stones or hydronephrosis. Urinary bladder is unremarkable. Stomach/Bowel: Large stool burden in the colon. No evidence of bowel obstruction. Vascular/Lymphatic: Heavily calcified aorta and iliac vessels. No evidence of aneurysm or adenopathy. Reproductive: Uterus and adnexa unremarkable.  No mass. Other: Trace free fluid in the pelvis.  No free air. Musculoskeletal: No acute bony abnormality. IMPRESSION: Large stool burden in the colon. Aortic atherosclerosis. Trace free fluid in the pelvis. Electronically Signed   By: KRolm BaptiseM.D.   On: 06/10/2022 22:49   DG Chest 2 View  Result Date: 06/10/2022 CLINICAL DATA:  Chest pain. EXAM: CHEST - 2 VIEW COMPARISON:  Jan 13, 2022. FINDINGS: The heart size and mediastinal contours are within normal limits. Left lung is clear. Minimal right basilar subsegmental atelectasis is  noted. The visualized skeletal structures are unremarkable. IMPRESSION: Minimal right basilar subsegmental atelectasis. Electronically Signed   By: JMarijo ConceptionM.D.   On: 06/10/2022 09:04    Procedures Procedures    Medications Ordered in ED Medications  sodium phosphate (FLEET) 7-19 GM/118ML enema 1 enema (1 enema Rectal Patient Refused/Not Given 06/10/22 2313)  lactated ringers bolus 1,000 mL (0 mLs Intravenous Stopped 06/10/22 2314)  acetaminophen (TYLENOL) tablet 1,000 mg (1,000 mg Oral Given 06/10/22 2024)  morphine (PF) 2 MG/ML injection 2 mg (2 mg Intravenous Given 06/10/22 2026)  potassium chloride SA (KLOR-CON M) CR  tablet 40 mEq (40 mEq Oral Given 06/10/22 2024)  iohexol (OMNIPAQUE) 350 MG/ML injection 75 mL (75 mLs Intravenous Contrast Given 06/10/22 2240)  lactulose (CHRONULAC) 10 GM/15ML solution 20 g (20 g Oral Given 06/10/22 2320)    ED Course/ Medical Decision Making/ A&P                           Medical Decision Making ALIENA GHRIST is a 71 y.o. female.  With PMH of A-fib on Xarelto, HTN, kidney stones, chronic constipation, cholecystectomy who presents with lower abdominal pain, decreased bowel movements and fever today.  Based on the patient's lower abdominal pain, differential includes but is not limited to UTI, constipation, diverticulitis, colitis, nephrolithiasis or possible pyelonephritis.  She is not vomiting and has noticed distention unlikely to be bowel obstruction but possibly partial bowel obstruction.  Additionally, has fever today 100.5 F but also could be influenced due to recent flu shot.  Labs were obtained and reviewed, she does have a normal white blood cell count 4.4 with stable anemia hemoglobin 11.4.  Mild hypokalemia 3.2, ordered for repletion.  No AKI creatinine 0.73.  No transaminitis and normal lipase, no concern for biliary pathology or pancreatitis.  EKG was normal sinus rhythm with no acute ST/T changes and she has no cardiopulmonary  complaints, unlikely ACS especially with normal high sensitive troponins which were flat 5 and 6.  CTAP obtained which showed large stool burden with no evidence of diverticulitis or other acute intra-abdominal pathology.  No evidence of bowel obstruction.  UA reviewed no leukocyte esterase no nitrite and no bacteria, no concern for UTI.  Patient was given IV fluids, Toradol, Tylenol and lactulose in the ED.  I offered her an enema but she declined at this time.  Discharging patient with prescription for MiraLAX 2 times daily, docusate, senna and short course of lactulose.  Advise close follow-up with PCP and strict return precautions.  She is safe for discharge home.  Amount and/or Complexity of Data Reviewed Labs: ordered. Radiology: ordered.  Risk OTC drugs. Prescription drug management.    Final Clinical Impression(s) / ED Diagnoses Final diagnoses:  Abdominal pain, unspecified abdominal location  Constipation, unspecified constipation type    Rx / DC Orders ED Discharge Orders          Ordered    polyethylene glycol (MIRALAX) 17 g packet  2 times daily        06/10/22 2312    senna-docusate (SENOKOT-S) 8.6-50 MG tablet  2 times daily        06/10/22 2312    Lactulose 20 GM/30ML SOLN  Daily        06/10/22 2312              Elgie Congo, MD 06/10/22 2358

## 2022-06-10 NOTE — ED Triage Notes (Signed)
Pt. Stated, [name] not had a bowel movement in this will make the 4 day. It makes my stomach and goes to my back hurt.

## 2022-06-11 NOTE — ED Notes (Signed)
Patient verbalizes understanding of discharge instructions. Opportunity for questioning and answers were provided. Armband removed by staff, pt discharged from ED. Pt ambulatory to ED waiting room with steady gait.  

## 2022-06-23 ENCOUNTER — Other Ambulatory Visit: Payer: Self-pay

## 2022-06-23 MED ORDER — ALENDRONATE SODIUM 70 MG PO TABS: 70.0000 mg | ORAL_TABLET | ORAL | 3 refills | Status: DC

## 2022-06-30 ENCOUNTER — Other Ambulatory Visit: Payer: Self-pay | Admitting: Internal Medicine

## 2022-06-30 DIAGNOSIS — S36039D Unspecified laceration of spleen, subsequent encounter: Secondary | ICD-10-CM

## 2022-06-30 NOTE — Telephone Encounter (Signed)
Last Appointment 01/21/2022.  Next appointment 07/01/2022.  No [facility]

## 2022-06-30 NOTE — Telephone Encounter (Signed)
Refill Request  HYDROcodone-acetaminophen (NORCO/VICODIN) 5-325 MG tablet   [id number] - [place], [place]

## 2022-07-01 ENCOUNTER — Encounter: Payer: Medicare Other | Admitting: Internal Medicine

## 2022-07-01 MED ORDER — HYDROCODONE-ACETAMINOPHEN 5-325 MG PO TABS: ORAL_TABLET | ORAL | 0 refills | Status: DC

## 2022-07-10 ENCOUNTER — Encounter (HOSPITAL_COMMUNITY): Payer: Self-pay

## 2022-07-10 ENCOUNTER — Emergency Department (HOSPITAL_COMMUNITY)
Admission: EM | Admit: 2022-07-10 | Discharge: 2022-07-10 | Disposition: A | Discharge status: Home / Self Care | Payer: Medicare Other | Attending: Emergency Medicine | Admitting: Emergency Medicine

## 2022-07-10 ENCOUNTER — Other Ambulatory Visit: Payer: Self-pay

## 2022-07-10 ENCOUNTER — Emergency Department (HOSPITAL_COMMUNITY): Payer: Medicare Other

## 2022-07-10 DIAGNOSIS — I10 Essential (primary) hypertension: Secondary | ICD-10-CM | POA: Insufficient documentation

## 2022-07-10 DIAGNOSIS — K5900 Constipation, unspecified: Secondary | ICD-10-CM | POA: Diagnosis not present

## 2022-07-10 DIAGNOSIS — Z7901 Long term (current) use of anticoagulants: Secondary | ICD-10-CM | POA: Diagnosis not present

## 2022-07-10 DIAGNOSIS — Z79899 Other long term (current) drug therapy: Secondary | ICD-10-CM | POA: Diagnosis not present

## 2022-07-10 DIAGNOSIS — K5730 Diverticulosis of large intestine without perforation or abscess without bleeding: Secondary | ICD-10-CM | POA: Diagnosis not present

## 2022-07-10 DIAGNOSIS — R0789 Other chest pain: Secondary | ICD-10-CM | POA: Diagnosis not present

## 2022-07-10 DIAGNOSIS — R110 Nausea: Secondary | ICD-10-CM | POA: Diagnosis not present

## 2022-07-10 DIAGNOSIS — R109 Unspecified abdominal pain: Secondary | ICD-10-CM | POA: Diagnosis not present

## 2022-07-10 DIAGNOSIS — R079 Chest pain, unspecified: Secondary | ICD-10-CM | POA: Diagnosis not present

## 2022-07-10 LAB — CBC
HCT: 38.4 % (ref 36.0–46.0)
Hemoglobin: 12.9 g/dL (ref 12.0–15.0)
MCH: 27.1 pg (ref 26.0–34.0)
MCHC: 33.6 g/dL (ref 30.0–36.0)
MCV: 80.7 fL (ref 80.0–100.0)
Platelets: 230 10*3/uL (ref 150–400)
RBC: 4.76 MIL/uL (ref 3.87–5.11)
RDW: 15.2 % (ref 11.5–15.5)
WBC: 3.9 10*3/uL — ABNORMAL LOW (ref 4.0–10.5)
nRBC: 0 % (ref 0.0–0.2)

## 2022-07-10 LAB — BASIC METABOLIC PANEL
Anion gap: 13 (ref 5–15)
BUN: 11 mg/dL (ref 8–23)
CO2: 22 mmol/L (ref 22–32)
Calcium: 10 mg/dL (ref 8.9–10.3)
Chloride: 106 mmol/L (ref 98–111)
Creatinine, Ser: 0.71 mg/dL (ref 0.44–1.00)
GFR, Estimated: >60 mL/min (ref >60)
Glucose, Bld: 99 mg/dL (ref 70–99)
Potassium: 3.6 mmol/L (ref 3.5–5.1)
Sodium: 141 mmol/L (ref 135–145)

## 2022-07-10 LAB — HEPATIC FUNCTION PANEL
ALT: 29 U/L (ref 0–44)
AST: 24 U/L (ref 15–41)
Albumin: 3.7 g/dL (ref 3.5–5.0)
Alkaline Phosphatase: 57 U/L (ref 38–126)
Bilirubin, Direct: <0.1 mg/dL (ref 0.0–0.2)
Total Bilirubin: 0.3 mg/dL (ref 0.3–1.2)
Total Protein: 7.1 g/dL (ref 6.5–8.1)

## 2022-07-10 LAB — TROPONIN I (HIGH SENSITIVITY)
Troponin I (High Sensitivity): 4 ng/L (ref <18)
Troponin I (High Sensitivity): 6 ng/L (ref <18)

## 2022-07-10 LAB — LIPASE, BLOOD: Lipase: 29 U/L (ref 11–51)

## 2022-07-10 MED ORDER — HYDROCODONE-ACETAMINOPHEN 5-325 MG PO TABS
1.0000 | ORAL_TABLET | Freq: Once | ORAL | Status: AC
  Administered 2022-07-10: 1 via ORAL
  Filled 2022-07-10: qty 1

## 2022-07-10 MED ORDER — LACTATED RINGERS IV BOLUS
1000.0000 mL | Freq: Once | INTRAVENOUS | Status: AC
  Administered 2022-07-10: 1000 mL via INTRAVENOUS

## 2022-07-10 MED ORDER — IOHEXOL 350 MG/ML SOLN
75.0000 mL | Freq: Once | INTRAVENOUS | Status: AC | PRN
  Administered 2022-07-10: 75 mL via INTRAVENOUS

## 2022-07-10 MED ORDER — LACTULOSE 10 GM/15ML PO SOLN: 10.0000 g | Freq: Every day | ORAL | 1 refills | Status: DC | PRN

## 2022-07-10 NOTE — ED Notes (Signed)
Pt ambulated to the restroom and back to her room with no assistance. Pt is back in bed and resting comfortably at this time.

## 2022-07-10 NOTE — ED Provider Notes (Signed)
[facility] EMERGENCY DEPARTMENT Provider Note   CSN: [id number] Arrival date & time: 07/10/22  0654     History  Chief Complaint  Patient presents with   Chest Pain    [name] is a 71 y.o. female.  Patient is a 71 year old female with a history of hypertension, GERD, chronic anticoagulation, atrial fibrillation status post ablation, chronic constipation on MiraLAX, Senokot and another medication who is presenting today complaining of chest discomfort and abdominal burning.  She reports that she has been taking so much medicine lately to have bowel movements that she is concerned she may have taken too much medicine.  Her last bowel movement was this morning and she reports it was a little bit runny but she has been having stools regularly.  She has had more abdominal pain and GERD type symptoms as well.  She has had nausea but denies any vomiting at this time.  She also feels that there is mucus in her chest which is making it slightly harder for her to breathe but denies any productive cough or fever.  She did uses her inhalers at home and has been compliant with those.  She does not use tobacco products any longer and has quit since 2017.  She did smoke a joint 2 months ago but denies any other marijuana use.  She has no known heart disease.  No swelling in her lower extremities or fullness in her abdomen.  No blood in her stool.  The history is provided by the patient.  Chest Pain      Home Medications Prior to Admission medications   Medication Sig Start Date End Date Taking? Authorizing Provider  lactulose (CHRONULAC) 10 GM/15ML solution Take 15 mLs (10 g total) by mouth daily as needed for moderate constipation. 07/10/22  Yes Blanchie Dessert, MD  alendronate (FOSAMAX) 70 MG tablet Take 1 tablet (70 mg total) by mouth every 7 (seven) days. Take with a full glass of water on an empty stomach. 06/23/22 06/23/23  Angelica Pou, MD  atorvastatin  (LIPITOR) 40 MG tablet Take 1 tablet (40 mg total) by mouth daily. 08/27/21 08/27/22  Angelica Pou, MD  cetirizine (ZYRTEC) 10 MG tablet Take 10 mg by mouth daily.    [provider]  dicyclomine (BENTYL) 10 MG capsule Take 1 capsule (10 mg total) by mouth every 8 (eight) hours as needed (abdominal pain). 05/28/22 06/27/22  Armbruster, Carlota Raspberry, MD  diltiazem (CARDIZEM) 60 MG tablet Take 1 tablet (60 mg total) by mouth every 6 (six) hours as needed (afib). 08/14/21   Sherran Needs, NP  diltiazem (CARTIA XT) 120 MG 24 hr capsule Take 1 capsule (120 mg total) by mouth 2 (two) times daily. 05/03/22   Sherran Needs, NP  docusate sodium (COLACE) 100 MG capsule Take 1 capsule (100 mg total) by mouth daily as needed for up to 180 doses for mild constipation. Patient not taking: Reported on 06/10/2022 06/09/22   Angelica Pou, MD  ezetimibe (ZETIA) 10 MG tablet Take 1 tablet (10 mg total) by mouth daily. 12/07/21 12/07/22  Angelica Pou, MD  fluticasone (FLOVENT HFA) 44 MCG/ACT inhaler Inhale 1 puff into the lungs daily. 09/08/21   Angelica Pou, MD  HYDROcodone-acetaminophen (NORCO/VICODIN) 5-325 MG tablet 1/2 to 1 tablet only when needed for severe pain, as often as every 6 hours. 07/01/22   Angelica Pou, MD  levalbuterol [facility] HFA) 45 MCG/ACT inhaler Inhale 1 puff into the  lungs every 6 (six) hours as needed for shortness of breath. 03/04/22   Angelica Pou, MD  linaclotide [facility]) 145 MCG CAPS capsule Take 1 capsule (145 mcg total) by mouth daily before breakfast. Patient not taking: Reported on 06/10/2022 06/09/22   Yetta Flock, MD  losartan (COZAAR) 50 MG tablet Take 2 tablets (100 mg total) by mouth daily. 06/09/22 06/09/23  Angelica Pou, MD  methocarbamol (ROBAXIN) 750 MG tablet Take 1 tablet (750 mg total) by mouth 3 (three) times daily as needed (muscle spasm/pain). Patient not taking: Reported on 06/10/2022 05/09/22   Lajean Saver, MD  montelukast (SINGULAIR) 10 MG tablet Take 1 tablet (10 mg total) by mouth at bedtime. 03/04/22   Angelica Pou, MD  omeprazole (PRILOSEC) 40 MG capsule Take 1 capsule (40 mg total) by mouth in the morning and at bedtime. 01/01/22   Iona Beard, MD  ondansetron (ZOFRAN) 4 MG tablet Take 1 tablet (4 mg total) by mouth every 4 (four) hours as needed for nausea or vomiting. Patient not taking: Reported on 06/10/2022 04/18/22   Wynona Dove A, DO  polyethylene glycol (MIRALAX / GLYCOLAX) 17 g packet Take 17 g by mouth 2 (two) times daily. 09/29/21   Timothy Lasso, MD  polyethylene glycol (MIRALAX) 17 g packet Take 17 g by mouth 2 (two) times daily for 14 days. 06/10/22 06/24/22  Elgie Congo, MD  potassium chloride (KLOR-CON M) 10 MEQ tablet Take 1 tablet (10 mEq total) by mouth daily. 06/09/22   Angelica Pou, MD  rivaroxaban (XARELTO) 20 MG TABS tablet Take 1 tablet (20 mg total) by mouth daily with supper. 01/12/22   Sherran Needs, NP  simethicone (GAS-X) 80 MG chewable tablet Chew 1 tablet (80 mg total) by mouth 4 (four) times daily as needed for up to 7 days for flatulence. 01/01/22 06/10/22  Iona Beard, MD  tiotropium (SPIRIVA) 18 MCG inhalation capsule Place 1 capsule (18 mcg total) into inhaler and inhale daily. 10/16/21   Angelica Pou, MD      Allergies    Albuterol, Percocet [oxycodone-acetaminophen], Celecoxib, and Tramadol    Review of Systems   Review of Systems  Cardiovascular:  Positive for chest pain.    Physical Exam Updated Vital Signs BP (!) 142/80 (BP Location: Right Arm)   Pulse 73   Temp 97.7 F (36.5 C) (Oral)   Resp 16   Ht '5\' 7"'$  (1.702 m)   Wt 59.9 kg   SpO2 100%   BMI 20.67 kg/m  Physical Exam Vitals and nursing note reviewed.  Constitutional:      General: She is not in acute distress.    Appearance: She is well-developed.  HENT:     Head: Normocephalic and atraumatic.     Nose: Nose normal.     Mouth/Throat:      Mouth: Mucous membranes are moist.  Eyes:     Pupils: Pupils are equal, round, and reactive to light.  Cardiovascular:     Rate and Rhythm: Normal rate and regular rhythm.     Heart sounds: Normal heart sounds. No murmur heard.    No friction rub.  Pulmonary:     Effort: Pulmonary effort is normal.     Breath sounds: Normal breath sounds. No wheezing or rales.  Abdominal:     General: Bowel sounds are normal. There is no distension.     Palpations: Abdomen is soft.     Tenderness: There is abdominal tenderness. There  is no guarding or rebound.     Comments: Diffuse tenderness  Musculoskeletal:        General: No tenderness. Normal range of motion.     Right lower leg: No edema.     Left lower leg: No edema.     Comments: No edema  Skin:    General: Skin is warm and dry.     Findings: No rash.  Neurological:     Mental Status: She is alert and oriented to person, place, and time. Mental status is at baseline.     Cranial Nerves: No cranial nerve deficit.  Psychiatric:        Mood and Affect: Mood normal.        Behavior: Behavior normal.     ED Results / Procedures / Treatments   Labs (all labs ordered are listed, but only abnormal results are displayed) Labs Reviewed  CBC - Abnormal; Notable for the following components:      Result Value   WBC 3.9 (*)    All other components within normal limits  BASIC METABOLIC PANEL  HEPATIC FUNCTION PANEL  LIPASE, BLOOD  TROPONIN I (HIGH SENSITIVITY)  TROPONIN I (HIGH SENSITIVITY)    EKG EKG Interpretation  Date/Time:  Saturday July 10 2022 06:55:26 EST Ventricular Rate:  85 PR Interval:  114 QRS Duration: 84 QT Interval:  376 QTC Calculation: 447 R Axis:   75 Text Interpretation: Normal sinus rhythm Normal ECG When compared with ECG of 10-Jun-2022 08:27, PREVIOUS ECG IS PRESENT Confirmed by Blanchie Dessert [phone]) on 07/10/2022 7:53:48 AM  Radiology CT ABDOMEN PELVIS W CONTRAST  Result Date:  07/10/2022 CLINICAL DATA:  Several day history of abdominal pain EXAM: CT ABDOMEN AND PELVIS WITH CONTRAST TECHNIQUE: Multidetector CT imaging of the abdomen and pelvis was performed using the standard protocol following bolus administration of intravenous contrast. RADIATION DOSE REDUCTION: This exam was performed according to the departmental dose-optimization program which includes automated exposure control, adjustment of the mA and/or kV according to patient size and/or use of iterative reconstruction technique. CONTRAST:  54m OMNIPAQUE IOHEXOL 350 MG/ML SOLN COMPARISON:  CT abdomen and pelvis dated 06/10/2022 and multiple priors dating back to 10/14/2018 FINDINGS: Lower chest: Peripheral 5 mm right lower lobe nodule (5:1) not substantially changed dating back to 08/11/2020, likely benign. No new pulmonary nodules in the lung bases. Bilateral lower lobe linear atelectasis/scarring. No pleural effusion or pneumothorax demonstrated. Partially imaged heart size is normal. Hepatobiliary: Bilobar hypodensities measuring up to 12 mm in segment 2 (3:9) a few of which have minimally increased in size and others minimally decreased in size when compared to 10/14/2018, likely cysts. Irregular hypoattenuation along inferior segment 4 (3:26) which demonstrates isoattenuating on delayed images, similar dating back to 01/20/2020 may reflect perfusional variation. No new suspicious focal lesions. Similar prominence of the intrahepatic bile ducts, likely related to cholecystectomy. Normal gallbladder. Pancreas: No focal lesions. Similar mild prominence of the pancreatic duct in the head measuring up to 6 mm dating back to 01/03/2020. Spleen: Normal in size without focal abnormality. Adrenals/Urinary Tract: No adrenal nodules. No suspicious renal mass, calculi or hydronephrosis. No focal bladder wall thickening. Stomach/Bowel: Normal appearance of the stomach. No evidence of bowel wall thickening, distention, or inflammatory  changes. Decreased but persistent moderate to large volume stool within the ascending and transverse colon. Colonic diverticulosis without acute diverticulitis. Normal appendix. Vascular/Lymphatic: Aortic atherosclerosis. Circumaortic left renal vein. No enlarged abdominal or pelvic lymph nodes. Reproductive: No adnexal masses. Other: Similar small  volume pelvic free fluid.  No free air. Musculoskeletal: No acute or abnormal lytic or blastic osseous lesions. s Multilevel degenerative changes of the partially imaged thoracic and lumbar spine. IMPRESSION: 1. Decreased but persistent moderate to large volume stool within the ascending and transverse colon. 2. Otherwise no acute abdominopelvic finding. 3. Colonic diverticulosis without acute diverticulitis. 4. Aortic Atherosclerosis (ICD10-I70.0). Electronically Signed   By: Darrin Nipper M.D.   On: 07/10/2022 11:44   DG Chest 2 View  Result Date: 07/10/2022 CLINICAL DATA:  Chest and abdominal pain EXAM: CHEST - 2 VIEW COMPARISON:  06/10/2022 FINDINGS: Generous lung volumes, stable. Artifact from EKG pads. There is no edema, consolidation, effusion, or pneumothorax. Normal heart size and mediastinal contours. IMPRESSION: Stable exam.  No evidence of active disease. Electronically Signed   By: Jorje Guild M.D.   On: 07/10/2022 07:42    Procedures Procedures    Medications Ordered in ED Medications  lactated ringers bolus 1,000 mL (0 mLs Intravenous Stopped 07/10/22 1114)  HYDROcodone-acetaminophen (NORCO/VICODIN) 5-325 MG per tablet 1 tablet (1 tablet Oral Given 07/10/22 0936)  iohexol (OMNIPAQUE) 350 MG/ML injection 75 mL (75 mLs Intravenous Contrast Given 07/10/22 1118)    ED Course/ Medical Decision Making/ A&P                           Medical Decision Making Amount and/or Complexity of Data Reviewed External Data Reviewed: notes. Labs: ordered. Decision-making details documented in ED Course. Radiology: ordered and independent interpretation  performed. Decision-making details documented in ED Course. ECG/medicine tests: ordered and independent interpretation performed. Decision-making details documented in ED Course.  Risk Prescription drug management.   Pt with multiple medical problems and comorbidities and presenting today with a complaint that caries a high risk for morbidity and mortality.  Here today with complaint of nonspecific chest pain but more so abdominal pain and issues with a lot of medication she is taking for her chronic constipation.  She does feel that there is mucus in her chest however question that this might be GERD and GI in nature.  She has no prior cardiac history.  I independently interpreted patient's EKG and labs.  EKG with normal sinus rhythm without acute findings, troponin is negative, CBC and BMP are within normal limits but LFTs and lipase are pending. Also will do a CT to ensure that there is no acute GI issue.  I have independently visualized and interpreted pt's images today.  Chest x-ray with normal heart and lungs.  No evidence of pneumonia.  Patient has no wheezing on exam and low suspicion for acute lung pathology at this time.  12:18 PM LFTs and lipase are within normal limits, CT with persistent stool burden but no hydronephrosis or obstruction.  Radiology reports decreased but persistent moderate to large volume stool within the a sending and transverse colon but no other acute findings.  Findings were discussed with the patient.  She would prefer to have lactulose as she reports that has helped her significantly more in the past with her issues with constipation.  She thinks taking MiraLAX, Senokot, prune juice and other over-the-counter laxatives are making her feel sick.  Patient was given a prescription.  She will follow-up with her PCP.  At this time no indication for admission.  She is stable for discharge home.         Final Clinical Impression(s) / ED Diagnoses Final diagnoses:   Constipation, unspecified constipation type  Rx / DC Orders ED Discharge Orders          Ordered    lactulose (CHRONULAC) 10 GM/15ML solution  Daily PRN        07/10/22 1218              Blanchie Dessert, MD 07/10/22 1218

## 2022-07-10 NOTE — Discharge Instructions (Signed)
Your CAT scan showed that you are still constipated.  Otherwise everything else looked normal today.  Your kidneys, electrolytes and heart all look good.  Your lungs were normal.  Start taking the liquid lactulose for your constipation to see if that will get your bowels moving and will most likely improve your abdominal pain.  Follow-up with your doctor as planned

## 2022-07-10 NOTE — ED Triage Notes (Signed)
Patient c/o chest pain and abd. Pain onset several days ago. C/o lots of gas

## 2022-07-13 ENCOUNTER — Telehealth: Payer: Self-pay

## 2022-07-13 NOTE — Telephone Encounter (Signed)
        Patient  visited [place] on 11/18    Telephone encounter attempt :  1ST  A HIPAA compliant voice message was left requesting a return call.  Instructed patient to call back   [place]  [phone] [address], [place], [place] [address] Phone: [phone] Email: [email or web address]

## 2022-07-14 ENCOUNTER — Telehealth: Payer: Self-pay

## 2022-07-14 NOTE — Telephone Encounter (Signed)
        Patient  visited [place] on 11/18     Telephone encounter attempt :  2nd  A HIPAA compliant voice message was left requesting a return call.  Instructed patient to call back   [place], [place] [facility]  [phone] [address], [place], [place] [address] Phone: [phone] Email: [email or web address]

## 2022-07-22 IMAGING — CR DG CHEST 2V
2 series · 2 of 2 positions shown · non-contrast
Comparison: February 03, 2021

CLINICAL DATA: Chest pain.

EXAM:
CHEST - 2 VIEW

[chest pa]
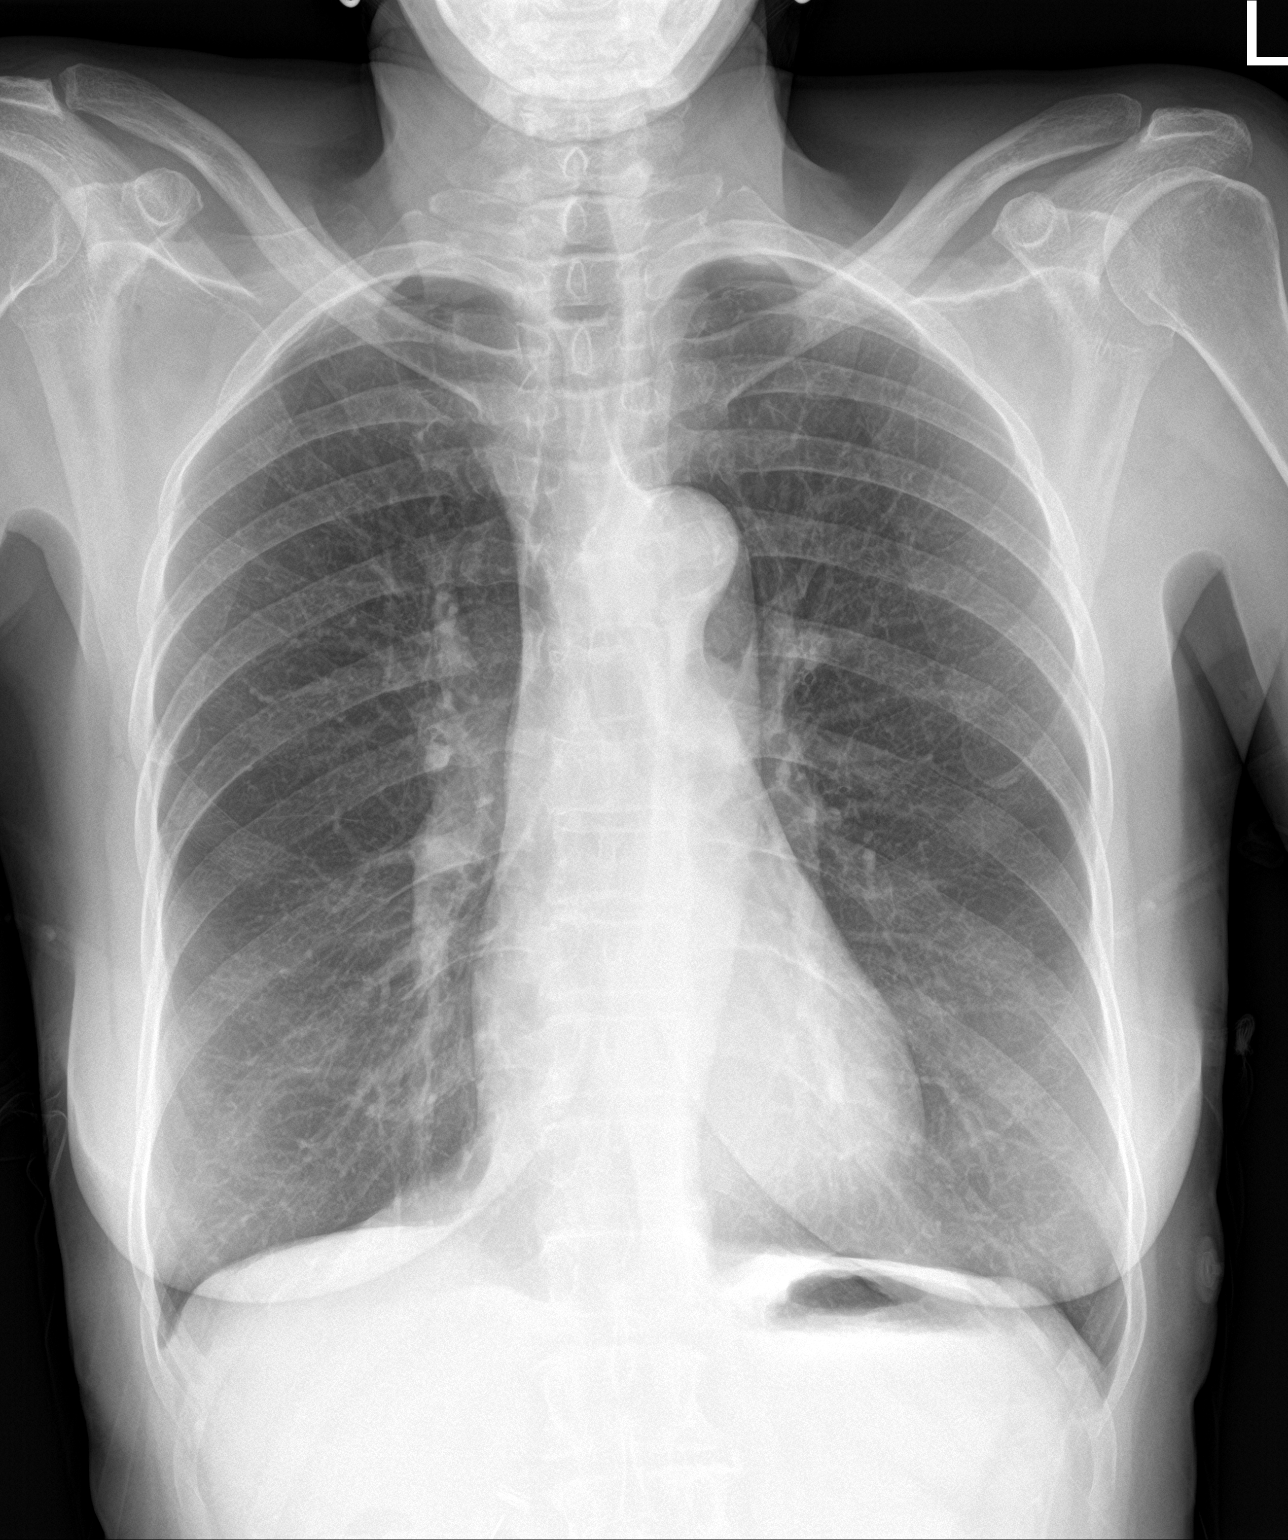

[chest lat]
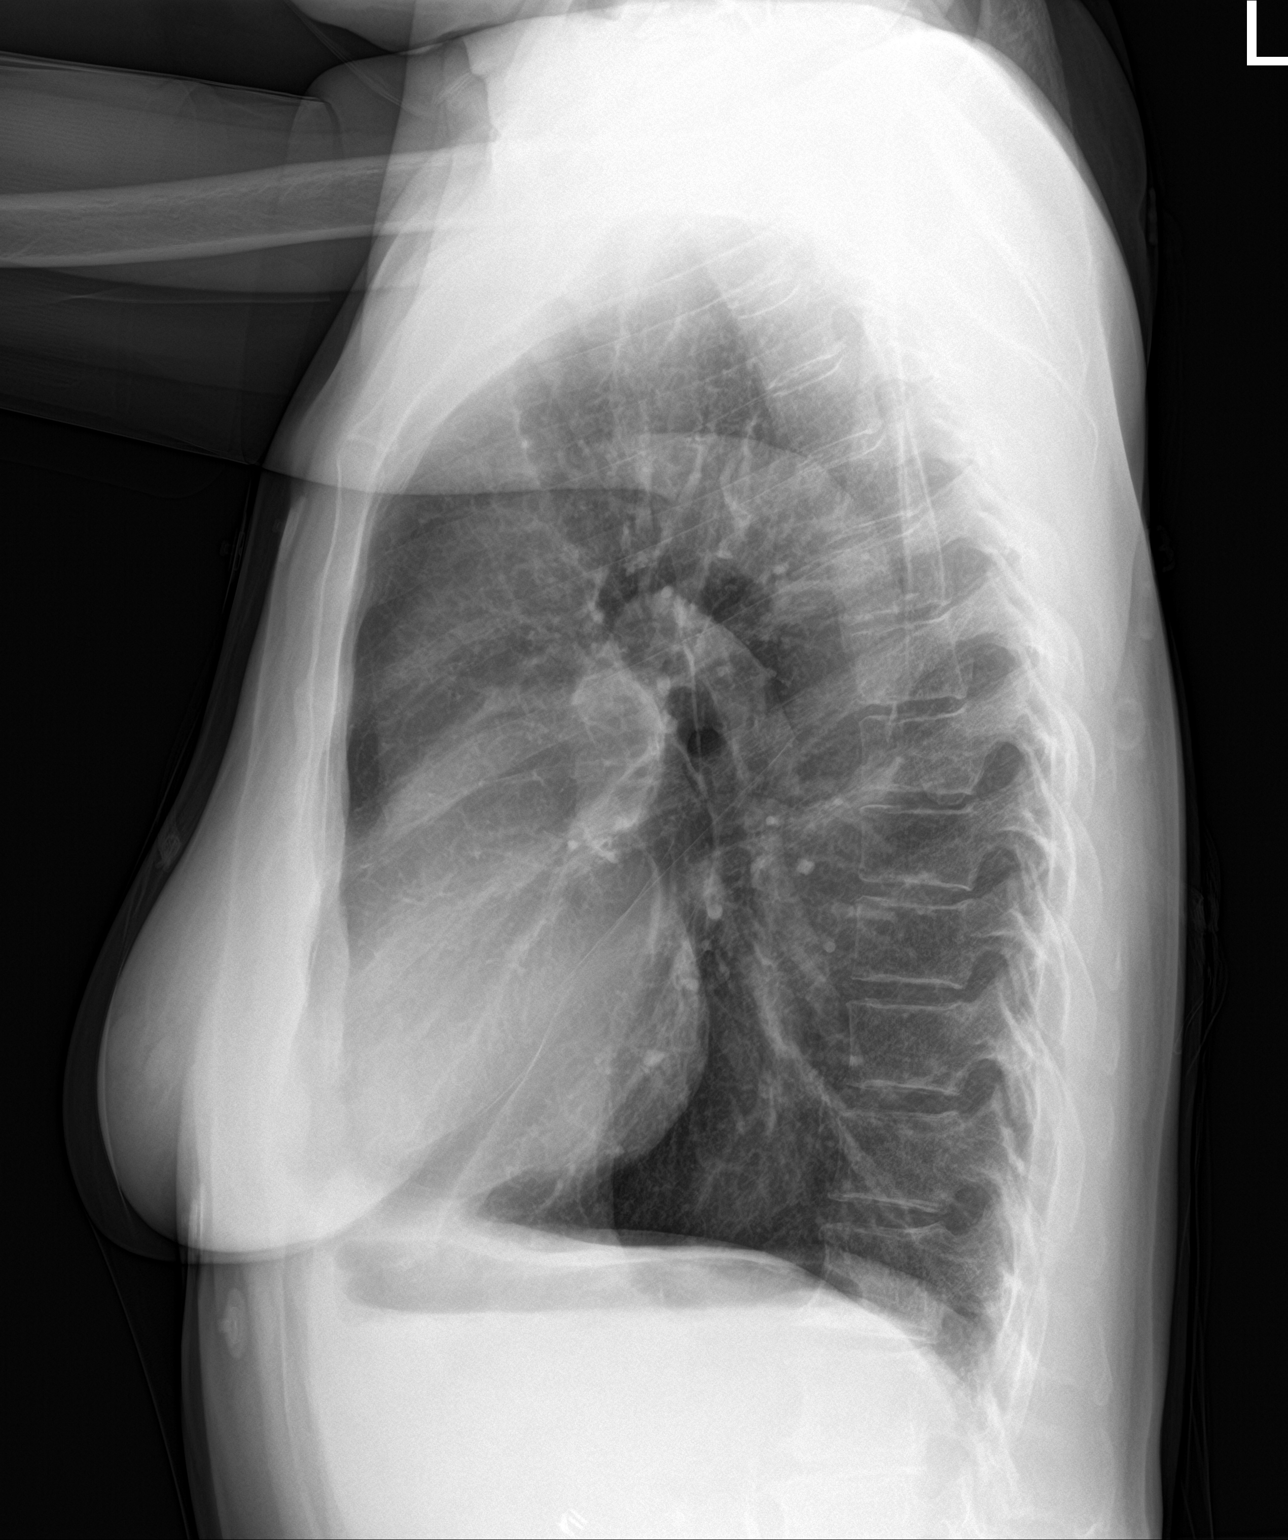

[2 of 2 positions shown; findings below may reference images not displayed]

FINDINGS: Hyperinflation of the lungs with flattening of the hemidiaphragms.
The heart, hila, mediastinum, lungs, and pleura are otherwise
normal.
IMPRESSION: Hyperinflation of the lungs suggesting COPD or emphysema. No other
abnormalities.

## 2022-07-28 IMAGING — CR DG CHEST 2V
2 series · 2 of 2 positions shown · non-contrast
Comparison: Six days ago

CLINICAL DATA: Chest pain

EXAM:
CHEST - 2 VIEW

[chest lat]
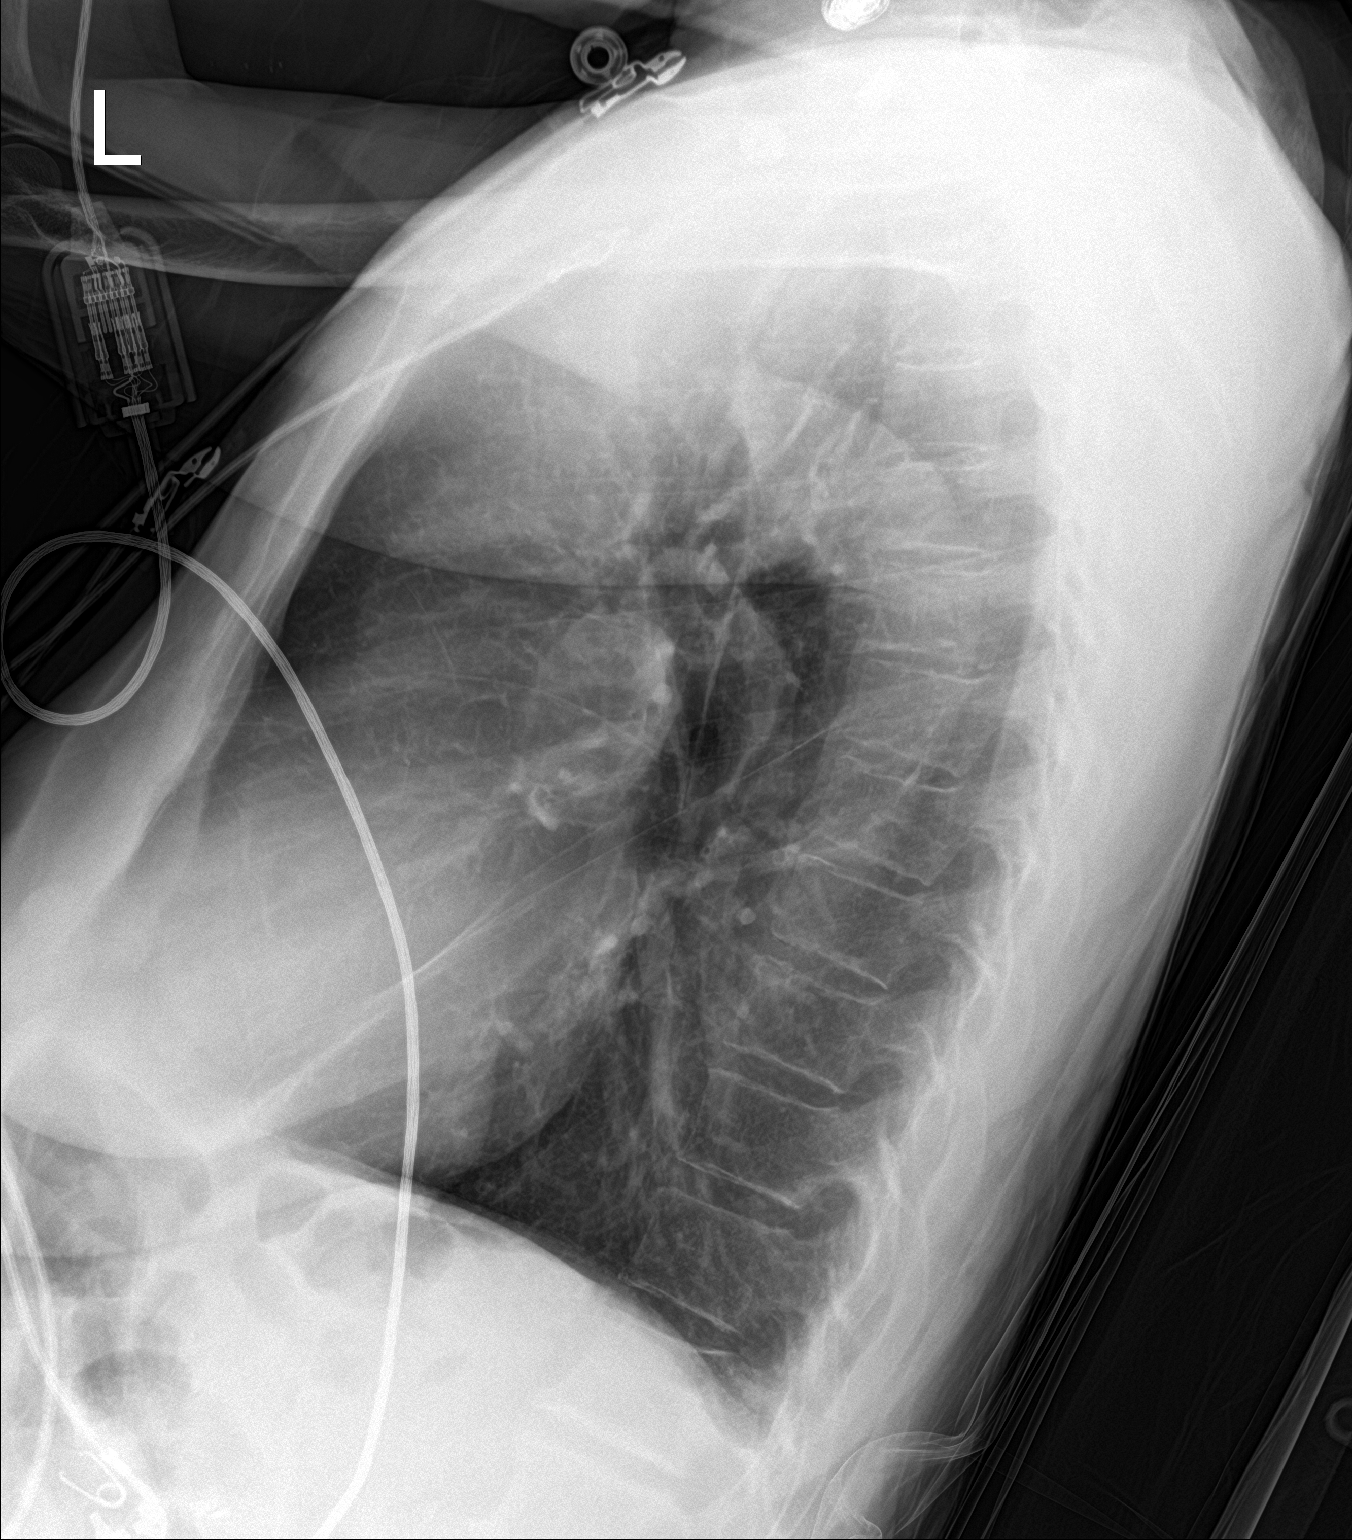

[chest ap]
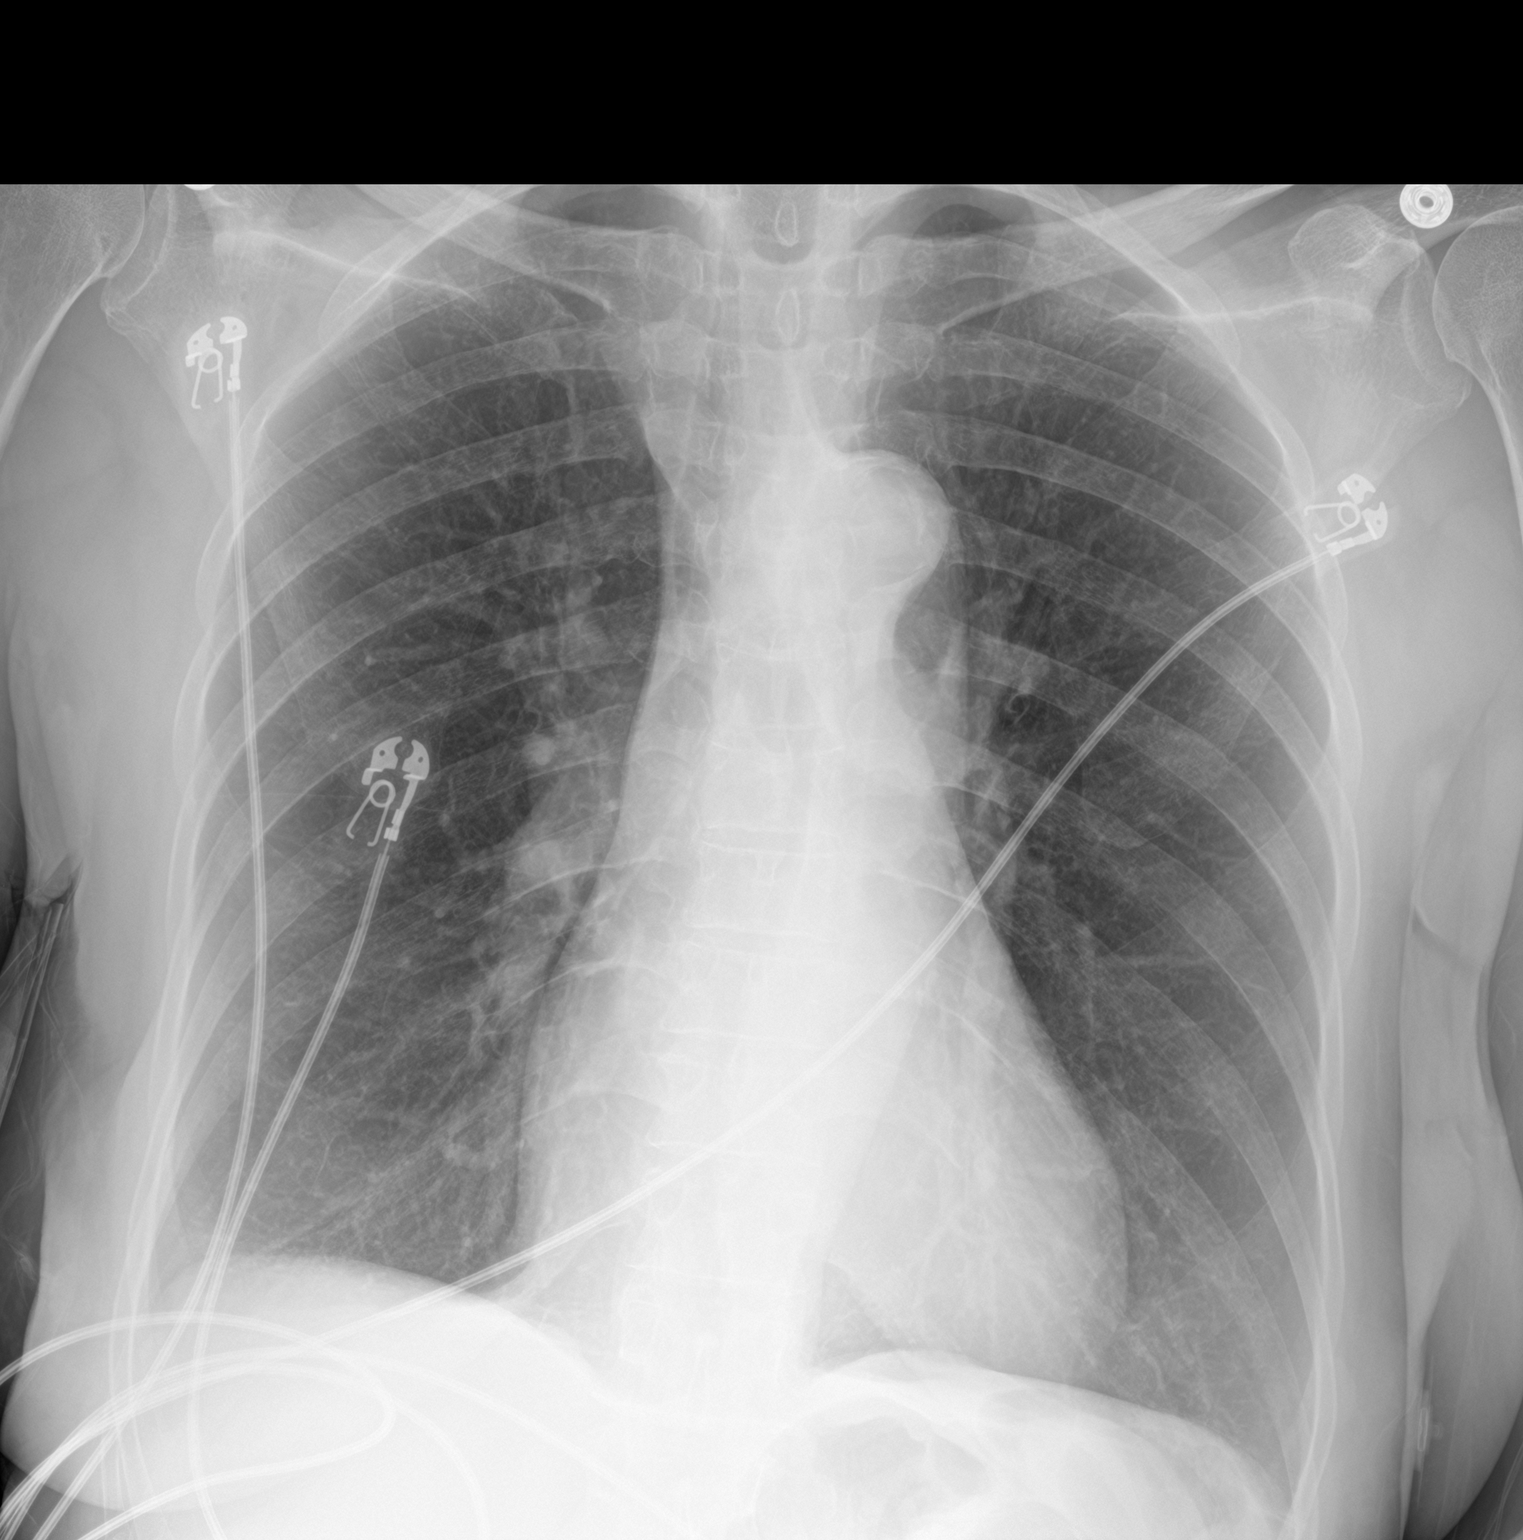

[2 of 2 positions shown; findings below may reference images not displayed]

FINDINGS: Normal heart size and mediastinal contours. No acute infiltrate or
edema. No effusion or pneumothorax. No acute osseous findings.
IMPRESSION: No evidence of active disease.

## 2022-08-02 ENCOUNTER — Other Ambulatory Visit: Payer: Self-pay

## 2022-08-02 DIAGNOSIS — S36039D Unspecified laceration of spleen, subsequent encounter: Secondary | ICD-10-CM

## 2022-08-02 MED ORDER — HYDROCODONE-ACETAMINOPHEN 5-325 MG PO TABS: ORAL_TABLET | ORAL | 0 refills | Status: DC

## 2022-08-02 MED ORDER — FLUTICASONE PROPIONATE HFA 110 MCG/ACT IN AERO: 1.0000 | INHALATION_SPRAY | Freq: Every day | RESPIRATORY_TRACT | 12 refills | Status: DC

## 2022-08-02 NOTE — Telephone Encounter (Signed)
fluticasone (FLOVENT HFA) 44 MCG/ACT inhaler  HYDROcodone-acetaminophen (NORCO/VICODIN) 5-325 MG tablet, refill request @  [facility] #[id number] - [place], [place].

## 2022-08-06 DIAGNOSIS — I739 Peripheral vascular disease, unspecified: Secondary | ICD-10-CM | POA: Diagnosis not present

## 2022-08-06 DIAGNOSIS — L84 Corns and callosities: Secondary | ICD-10-CM | POA: Diagnosis not present

## 2022-08-06 DIAGNOSIS — L851 Acquired keratosis [keratoderma] palmaris et plantaris: Secondary | ICD-10-CM | POA: Diagnosis not present

## 2022-08-06 DIAGNOSIS — G629 Polyneuropathy, unspecified: Secondary | ICD-10-CM | POA: Diagnosis not present

## 2022-08-06 DIAGNOSIS — I872 Venous insufficiency (chronic) (peripheral): Secondary | ICD-10-CM | POA: Diagnosis not present

## 2022-08-07 IMAGING — CR DG CHEST 1V
1 series · 1 of 1 positions shown · non-contrast
Comparison: April 11, 2021

CLINICAL DATA: Nausea, vomiting and dizziness.

EXAM:
CHEST  1 VIEW

[chest ap]
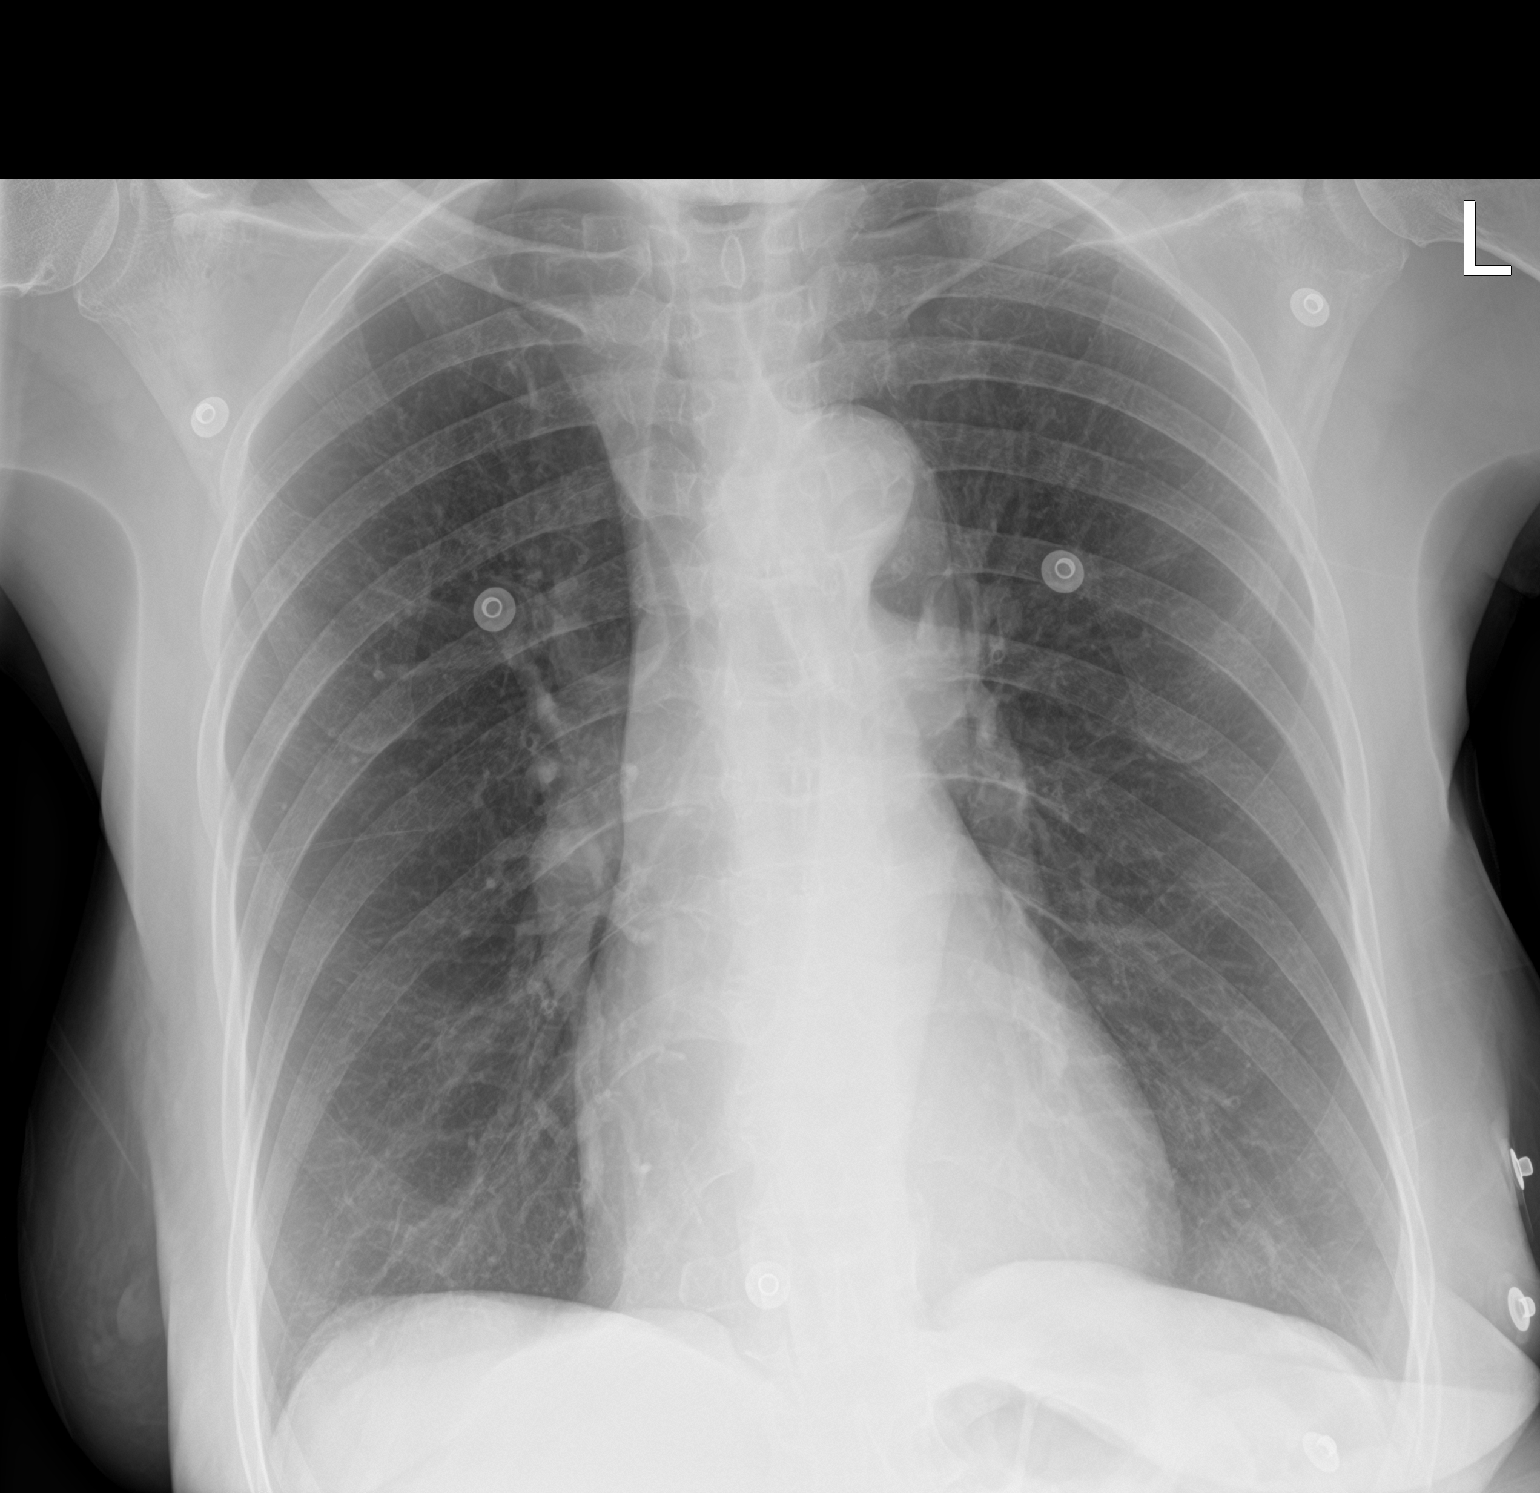

[1 of 1 positions shown; findings below may reference images not displayed]

FINDINGS: Cardiomediastinal contours and hilar structures are stable.

Lungs are clear.  No effusion on frontal view.  No pneumothorax.

On limited assessment there is no acute skeletal process.
IMPRESSION: No acute cardiopulmonary process.

## 2022-08-07 IMAGING — CT CT ABD-PELV W/ CM
2 of 5 series · 16 of 46 positions shown, 18 images · IV contrast (Omni 300)
Comparison: February 03, 2021.

CLINICAL DATA: Acute nonlocalized abdominal pain in a 70-year-old
female.

EXAM:
CT ABDOMEN AND PELVIS WITH CONTRAST
TECHNIQUE: Multidetector CT imaging of the abdomen and pelvis was performed
using the standard protocol following bolus administration of
intravenous contrast.
CONTRAST:  50mL OMNIPAQUE IOHEXOL 350 MG/ML SOLN

[Series 3: a/p w/ 5mm · axial · 0.71mm/px · z∈[-781,-416]mm · 13 of 83 slices shown, 15 images]
[im 5/83  soft-tissue]
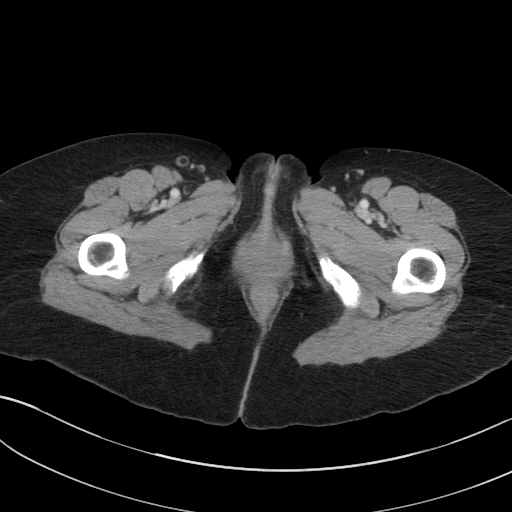
[im 5/83  bone]
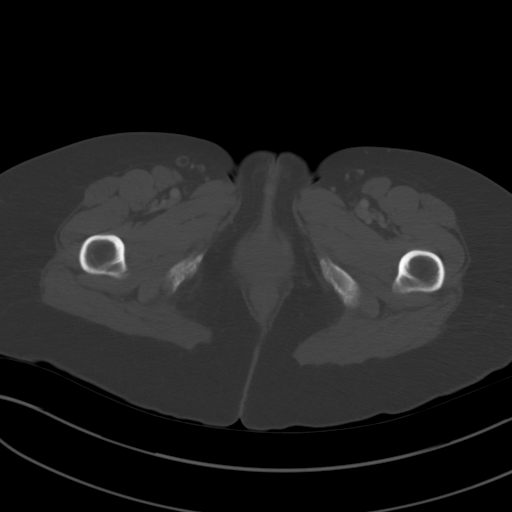
[im 13/83  soft-tissue]
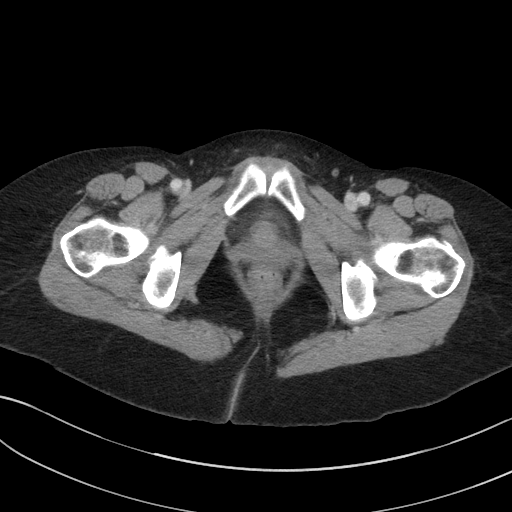
[im 18/83  soft-tissue]
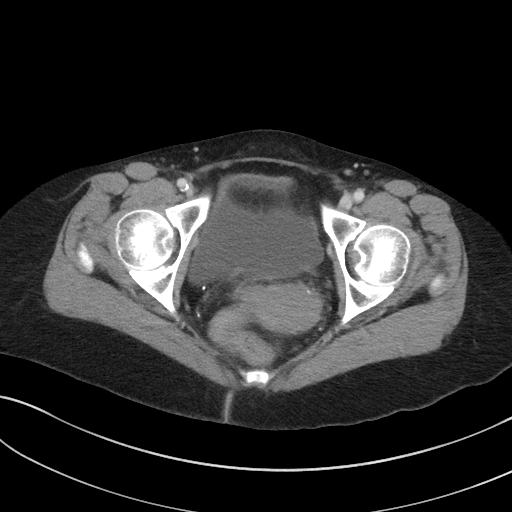
[im 22/83  soft-tissue]
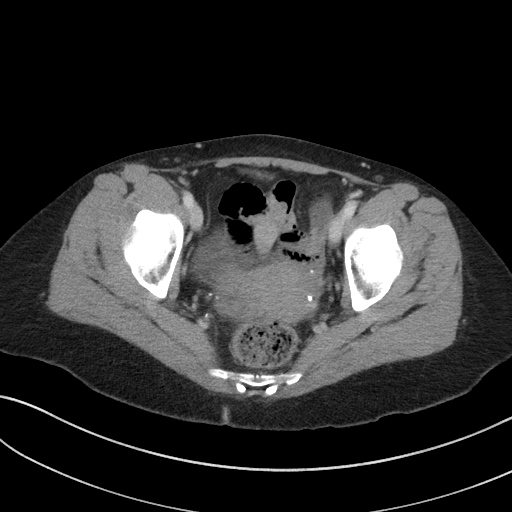
[im 31/83  soft-tissue]
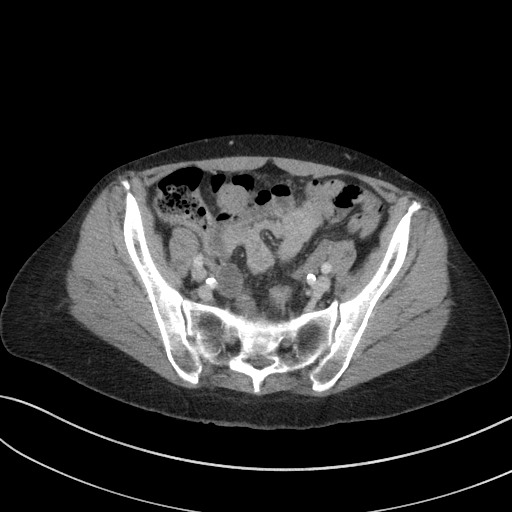
[im 35/83  soft-tissue]
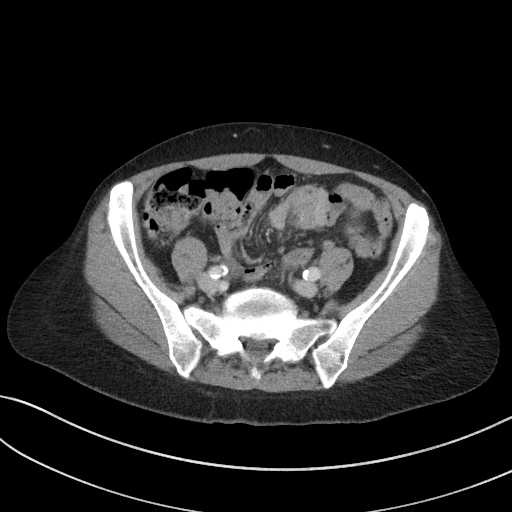
[im 44/83  soft-tissue]
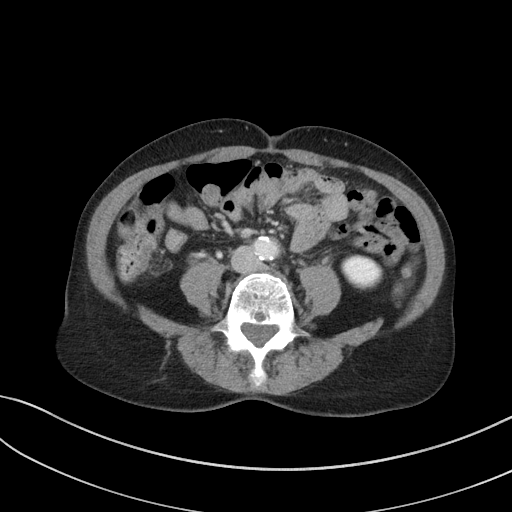
[im 48/83  soft-tissue]
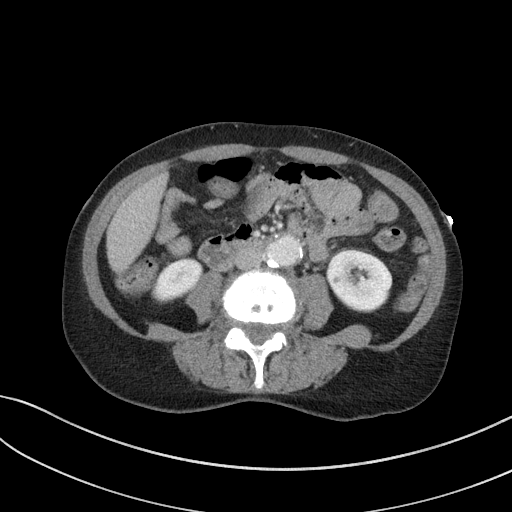
[im 52/83  soft-tissue]
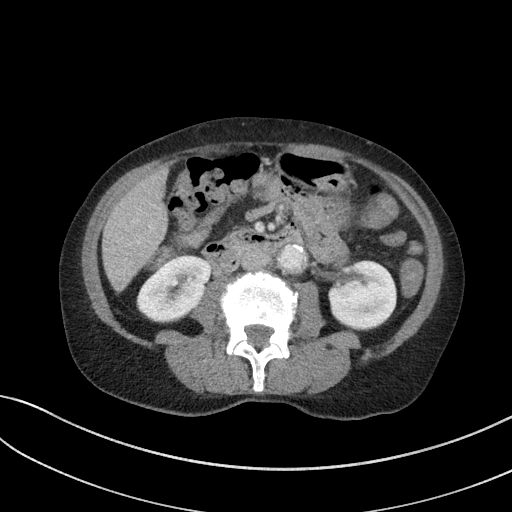
[im 52/83  bone]
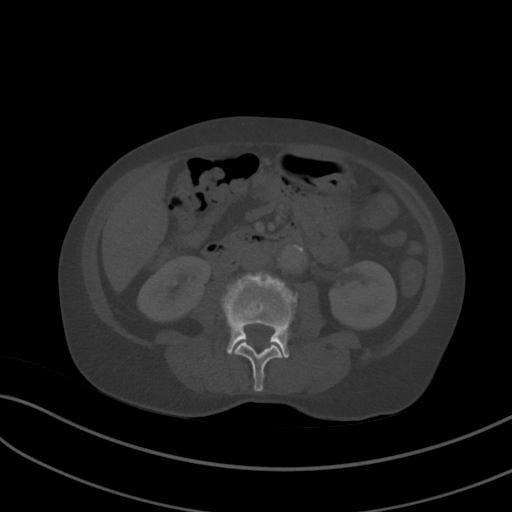
[im 61/83  soft-tissue]
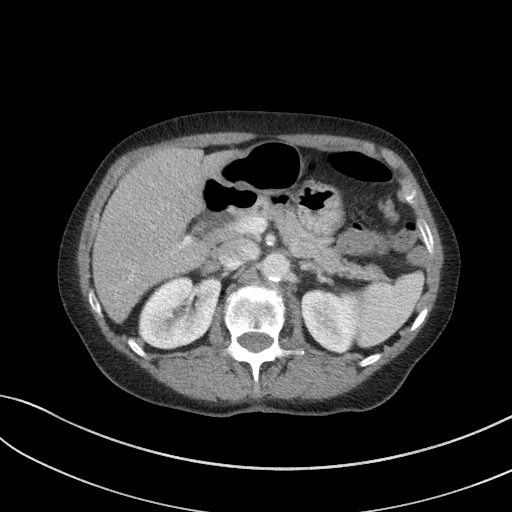
[im 65/83  soft-tissue]
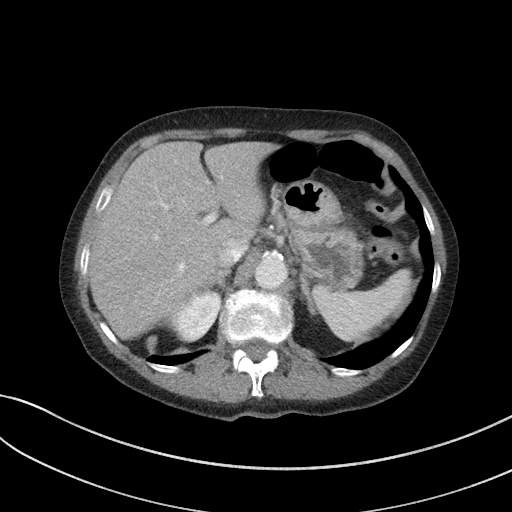
[im 70/83  soft-tissue]
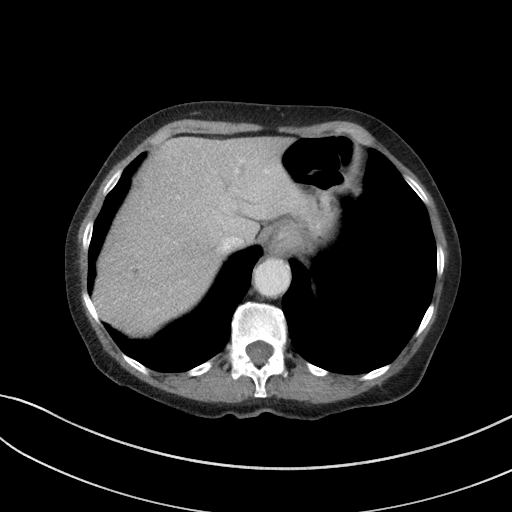
[im 78/83  soft-tissue]
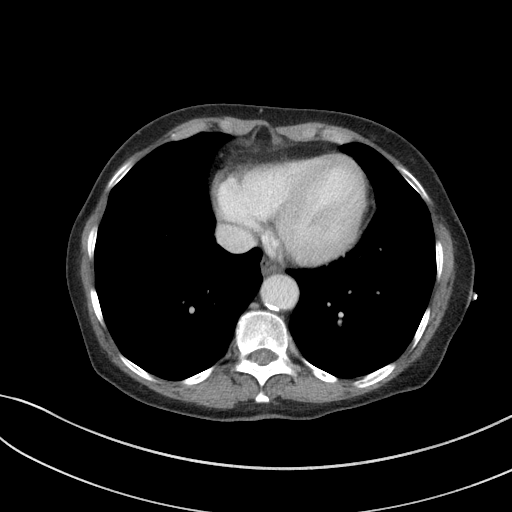

[Series 6: a/p w/ cor · coronal · 0.74mm/px · 3 of 99 slices shown]
[im 33/99  soft-tissue]
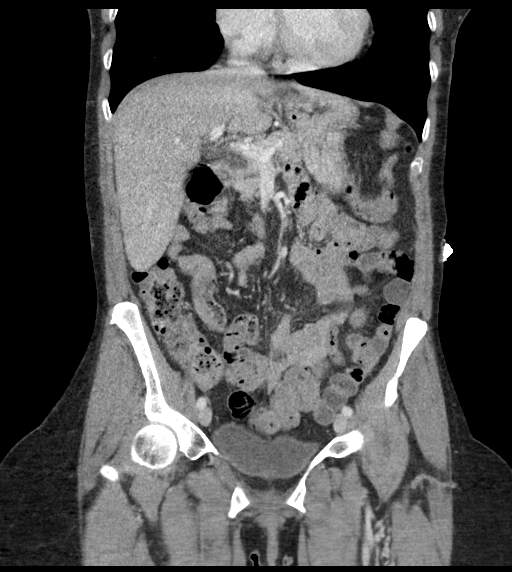
[im 44/99  soft-tissue]
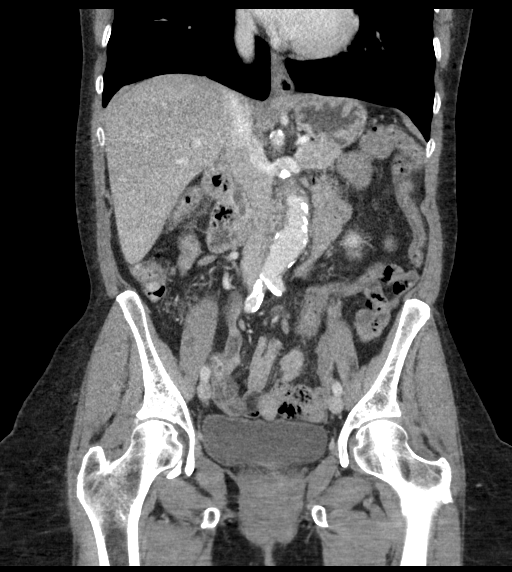
[im 55/99  soft-tissue]
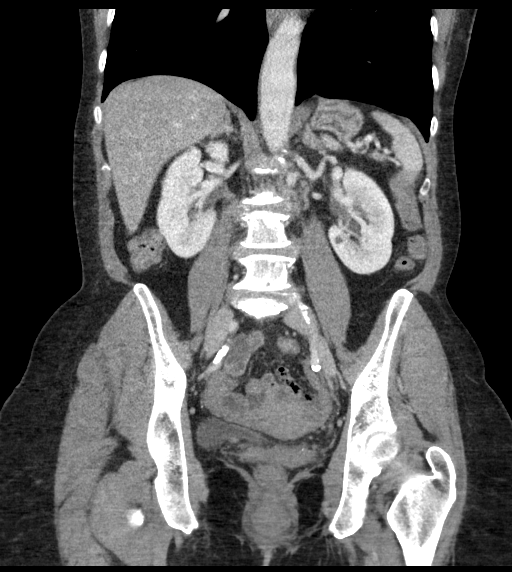

[16 of 46 positions shown; findings below may reference images not displayed]

FINDINGS: Lower chest: Lung bases are clear without effusion or consolidation.

Hepatobiliary: Stable low-density hepatic lesions likely small
cysts. Post cholecystectomy. Portal vein is patent.

Pancreas: Stable appearance of ductal dilation of the pancreatic
duct in the pancreatic head without signs of significant pancreatic
atrophy unchanged dating back to Sunday December, 2019. Main pancreatic duct
dilated up to 5 mm. No focal lesion or sign of inflammation.

Spleen: Spleen normal size and contour.

Adrenals/Urinary Tract: Adrenal glands are normal.

Symmetric renal enhancement. No hydronephrosis. Smooth contour of
the urinary bladder. No perivesical stranding.

Stomach/Bowel: Question mild gastric thickening and mild thickening
of the distal esophagus.

No acute small bowel process. Appendix not visualized, no stranding
adjacent to the cecum to suggest acute appendicitis. No pericolonic
stranding or signs of bowel obstruction.

Vascular/Lymphatic: Aortic atherosclerosis. Irregular plaque
throughout the abdominal aorta without aneurysmal dilation measuring
2.4 cm greatest AP dimension and 2.4 cm greatest transverse
dimension with some tortuosity. Abdominal vessels are patent with
smooth contour the IVC. Circumaortic LEFT renal vein. There is no
gastrohepatic or hepatoduodenal ligament lymphadenopathy. No
retroperitoneal or mesenteric lymphadenopathy.

No pelvic sidewall lymphadenopathy.

Reproductive: No adnexal mass. Uterus remains in-situ, grossly
unremarkable.

Other: No ascites.  No free air.

Musculoskeletal: Spinal degenerative changes, mild.
IMPRESSION: Question mild gastric thickening and mild thickening of the distal
esophagus, may represent esophagogastritis.

Aortic atherosclerosis.

## 2022-08-07 IMAGING — MR MR HEAD W/O CM
12 of 13 series · 44 of 48 positions shown · non-contrast
Comparison: 11/08/2018

CLINICAL DATA: Dizziness and nausea

EXAM:
MRI HEAD WITHOUT CONTRAST
TECHNIQUE: Multiplanar, multiecho pulse sequences of the brain and surrounding
structures were obtained without intravenous contrast.

[Series 5: DWI · axial · 3.0mm · 0.88mm/px · z∈[-127,+14]mm · 8 of 100 slices shown (1 of 4)]
[im 1/100]
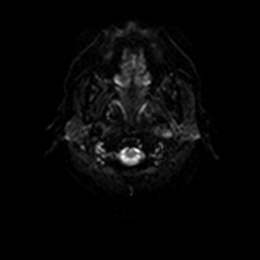
[im 15/100]
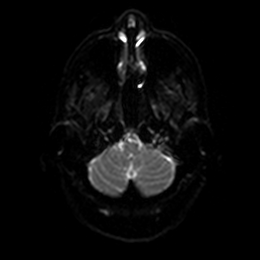
[im 29/100]
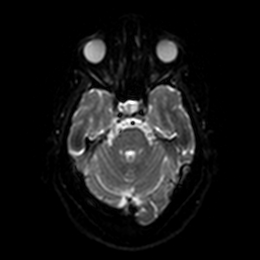
[im 43/100]
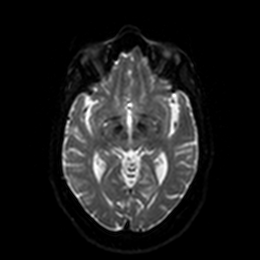
[im 57/100]
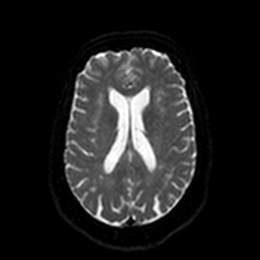
[im 71/100]
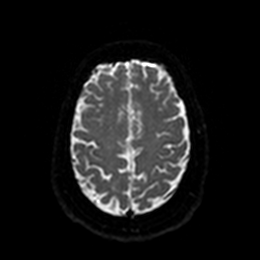
[im 85/100]
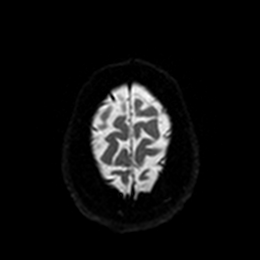
[im 100/100]
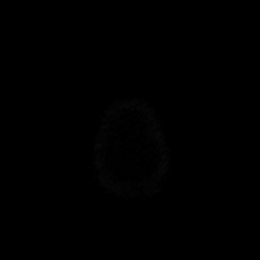

[Series 6: DWI · axial · 3.0mm · 0.88mm/px · z∈[-127,+14]mm · 4 of 50 slices shown (2 of 4)]
[im 1/50]
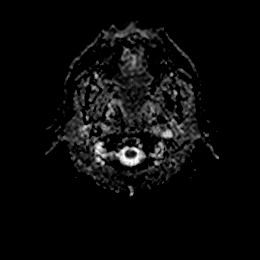
[im 17/50]
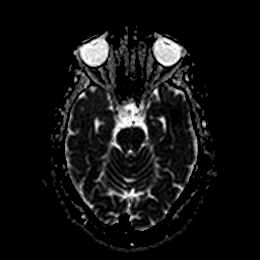
[im 33/50]
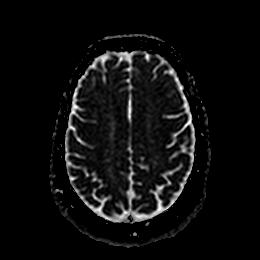
[im 50/50]
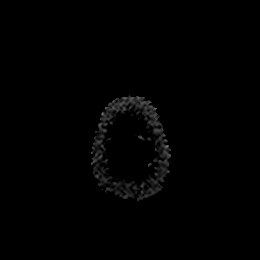

[Series 7: DWI · coronal · 4.0mm · 0.88mm/px · 5 of 70 slices shown (3 of 4)]
[im 1/70]
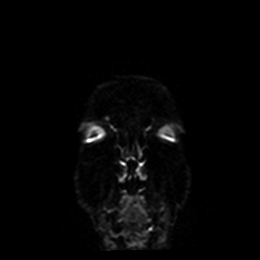
[im 18/70]
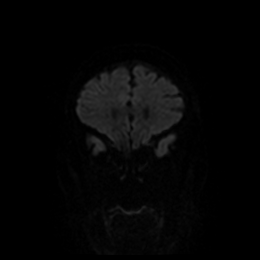
[im 35/70]
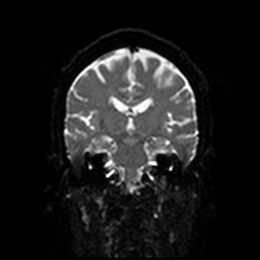
[im 52/70]
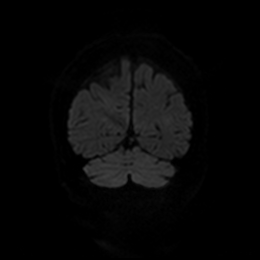
[im 70/70]
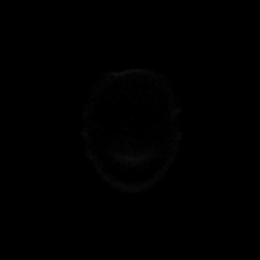

[Series 8: DWI · coronal · 4.0mm · 0.88mm/px · 3 of 35 slices shown (4 of 4)]
[im 1/35]
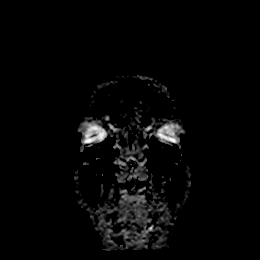
[im 18/35]
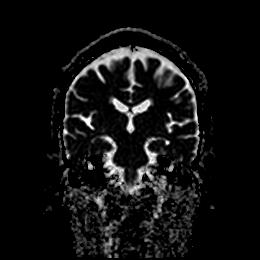
[im 35/35]
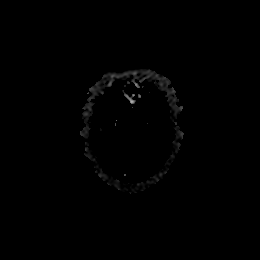

[Series 9: T1 · sagittal · 5.0mm · 0.75mm/px · 2 of 23 slices shown]
[im 1/23]
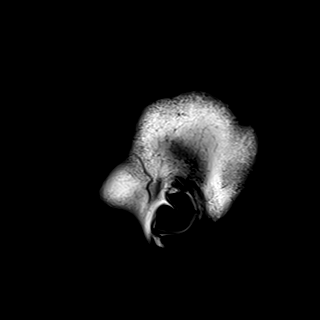
[im 23/23]
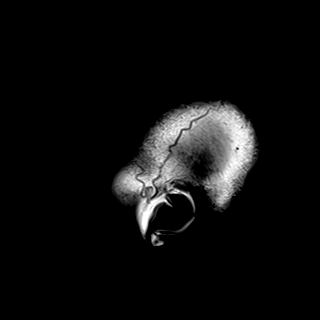

[Series 10: T2 · axial · 5.0mm · 0.72mm/px · z∈[-131,+18]mm · 2 of 27 slices shown (1 of 2)]
[im 1/27]
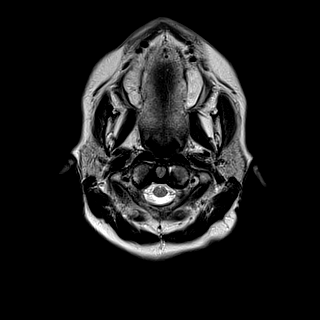
[im 27/27]
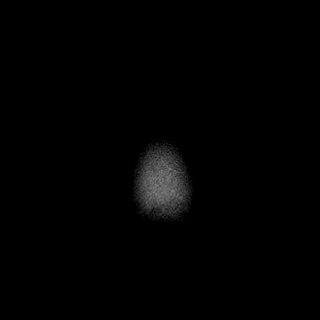

[Series 11: FLAIR · axial · 5.0mm · 0.45mm/px · z∈[-132,+16]mm · 2 of 27 slices shown]
[im 1/27]
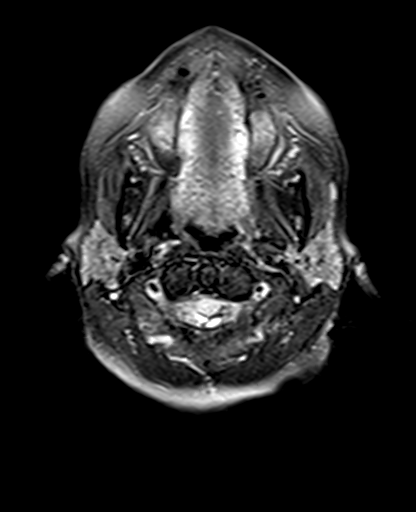
[im 27/27]
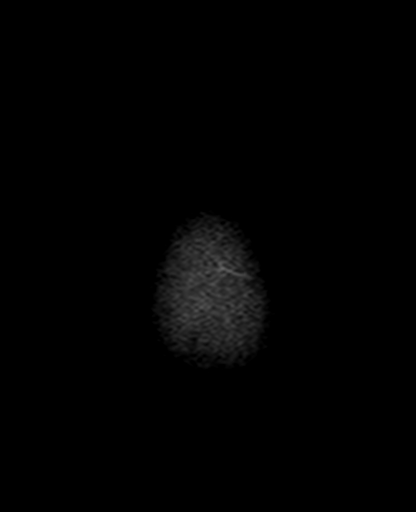

[Series 12: mag_images · axial · 3.0mm · 0.90mm/px · z∈[-131,+15]mm · 4 of 52 slices shown]
[im 1/52]
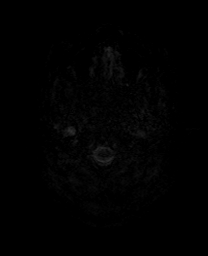
[im 18/52]
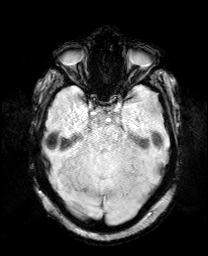
[im 35/52]
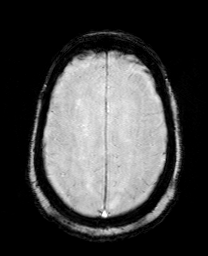
[im 52/52]
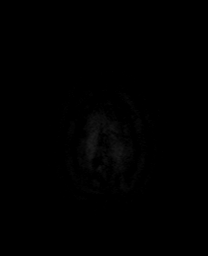

[Series 13: pha_images · axial · 3.0mm · 0.90mm/px · z∈[-131,+15]mm · 4 of 52 slices shown]
[im 1/52]
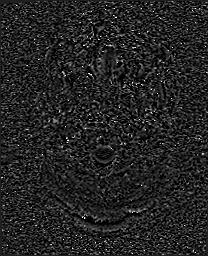
[im 18/52]
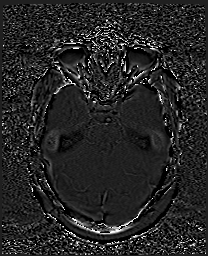
[im 35/52]
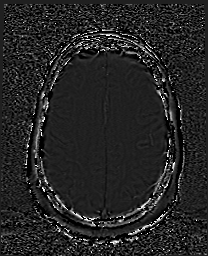
[im 52/52]
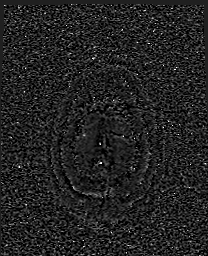

[Series 14: swi_images · axial · 3.0mm · 0.90mm/px · z∈[-131,+15]mm · 4 of 52 slices shown]
[im 1/52]
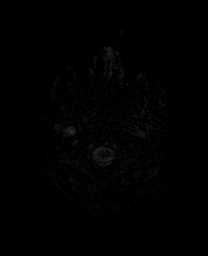
[im 18/52]
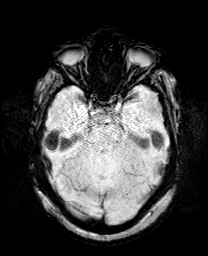
[im 35/52]
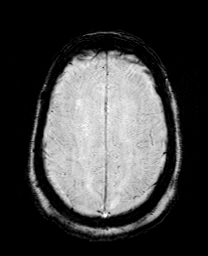
[im 52/52]
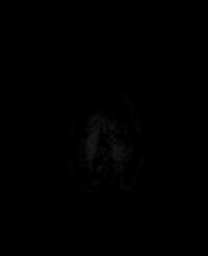

[Series 15: mip_images(sw) · axial · 24.0mm · 0.90mm/px · z∈[-121,+5]mm · 4 of 45 slices shown]
[im 1/45]
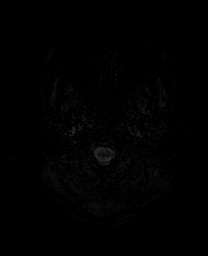
[im 15/45]
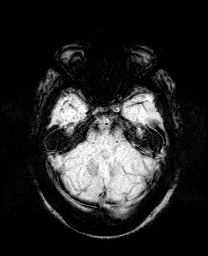
[im 30/45]
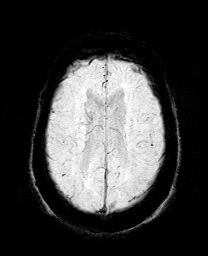
[im 45/45]
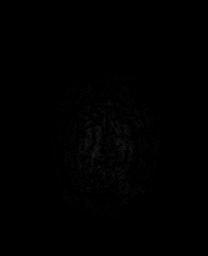

[Series 17: T2 · coronal · 5.0mm · 0.34mm/px · 2 of 29 slices shown (2 of 2)]
[im 1/29]
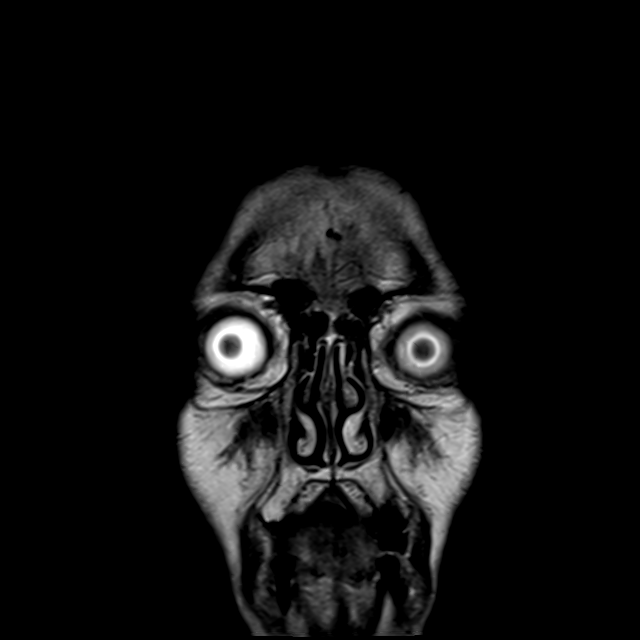
[im 29/29]
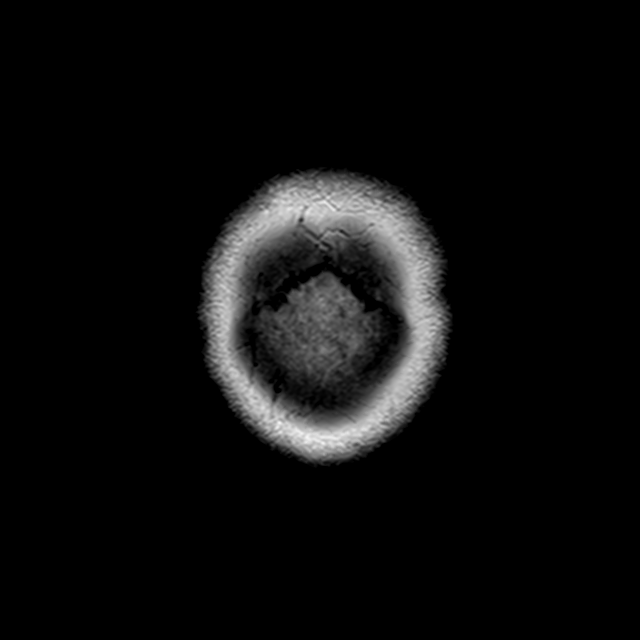

[44 of 48 positions shown; findings below may reference images not displayed]

FINDINGS: Brain: No acute infarct, mass effect or extra-axial collection. No
acute or chronic hemorrhage. There is multifocal hyperintense
T2-weighted signal within the white matter. Generalized volume loss
without a clear lobar predilection. The midline structures are
normal.

Vascular: Major flow voids are preserved.

Skull and upper cervical spine: Normal calvarium and skull base.
Visualized upper cervical spine and soft tissues are normal.

Sinuses/Orbits:No paranasal sinus fluid levels or advanced mucosal
thickening. No mastoid or middle ear effusion. Normal orbits.
IMPRESSION: 1. No acute intracranial abnormality.
2. Generalized volume loss and findings of chronic small vessel
disease.

## 2022-08-09 ENCOUNTER — Other Ambulatory Visit (HOSPITAL_COMMUNITY): Payer: Self-pay | Admitting: *Deleted

## 2022-08-09 ENCOUNTER — Other Ambulatory Visit: Payer: Self-pay

## 2022-08-09 MED ORDER — DILTIAZEM HCL ER COATED BEADS 120 MG PO CP24: 120.0000 mg | ORAL_CAPSULE | Freq: Two times a day (BID) | ORAL | 2 refills | Status: DC

## 2022-08-10 ENCOUNTER — Telehealth: Payer: Self-pay

## 2022-08-10 NOTE — Patient Outreach (Signed)
  Care Coordination   08/10/2022 Name: [name] MRN: [id number] DOB: [birth year]   Care Coordination Outreach Attempts:  An unsuccessful telephone outreach was attempted today to offer the patient information about available care coordination services as a benefit of their health plan.   Follow Up Plan:  Additional outreach attempts will be made to offer the patient care coordination information and services.   Encounter Outcome:  No Answer   Care Coordination Interventions:  No, not indicated    Jone Baseman, RN, MSN [facility] Management Care Management Coordinator Direct Line [phone]

## 2022-08-11 NOTE — Telephone Encounter (Signed)
Next appt scheduled 09/30/22 with PCP.

## 2022-08-11 NOTE — Telephone Encounter (Signed)
Pt calling back again to check on her Refill.  Pt calling to f/u on her refill request.     Potassium Chloride Crys ER 10 mEq Oral Daily   [facility] [place], [place] [address] [place], [place] [address] Phone: [phone]  Fax: [phone]

## 2022-08-11 NOTE — Telephone Encounter (Signed)
Pt calling to f/u on her refill request.    Potassium Chloride Crys ER 10 mEq Oral Daily  [facility] [place], [place] [address] [place], [place] [address] Phone: [phone]  Fax: [phone]

## 2022-08-12 MED ORDER — POTASSIUM CHLORIDE CRYS ER 10 MEQ PO TBCR: 10.0000 meq | EXTENDED_RELEASE_TABLET | Freq: Every day | ORAL | 0 refills | Status: DC

## 2022-08-26 ENCOUNTER — Telehealth: Payer: Self-pay

## 2022-08-26 NOTE — Patient Outreach (Signed)
  Care Coordination   08/26/2022 Name: [name] MRN: [id number] DOB: 08-01-1951   Care Coordination Outreach Attempts:  A second unsuccessful outreach was attempted today to offer the patient with information about available care coordination services as a benefit of their health plan.     Follow Up Plan:  Additional outreach attempts will be made to offer the patient care coordination information and services.   Encounter Outcome:  No Answer   Care Coordination Interventions:  No, not indicated    Jone Baseman, RN, MSN [facility] Care Management Coordinator Direct Line [phone]

## 2022-08-30 ENCOUNTER — Other Ambulatory Visit: Payer: Self-pay

## 2022-08-30 DIAGNOSIS — S36039D Unspecified laceration of spleen, subsequent encounter: Secondary | ICD-10-CM

## 2022-08-31 MED ORDER — HYDROCODONE-ACETAMINOPHEN 5-325 MG PO TABS: ORAL_TABLET | ORAL | 0 refills | Status: DC

## 2022-09-06 ENCOUNTER — Emergency Department (HOSPITAL_COMMUNITY): Payer: 59

## 2022-09-06 ENCOUNTER — Emergency Department (HOSPITAL_COMMUNITY)
Admission: EM | Admit: 2022-09-06 | Discharge: 2022-09-06 | Disposition: A | Discharge status: Home / Self Care | Payer: 59 | Attending: Emergency Medicine | Admitting: Emergency Medicine

## 2022-09-06 DIAGNOSIS — J45909 Unspecified asthma, uncomplicated: Secondary | ICD-10-CM | POA: Diagnosis not present

## 2022-09-06 DIAGNOSIS — Z8616 Personal history of COVID-19: Secondary | ICD-10-CM | POA: Diagnosis not present

## 2022-09-06 DIAGNOSIS — R1032 Left lower quadrant pain: Secondary | ICD-10-CM

## 2022-09-06 DIAGNOSIS — R079 Chest pain, unspecified: Secondary | ICD-10-CM | POA: Diagnosis not present

## 2022-09-06 DIAGNOSIS — R197 Diarrhea, unspecified: Secondary | ICD-10-CM | POA: Diagnosis not present

## 2022-09-06 DIAGNOSIS — R1031 Right lower quadrant pain: Secondary | ICD-10-CM | POA: Diagnosis not present

## 2022-09-06 DIAGNOSIS — I10 Essential (primary) hypertension: Secondary | ICD-10-CM | POA: Diagnosis not present

## 2022-09-06 DIAGNOSIS — R109 Unspecified abdominal pain: Secondary | ICD-10-CM | POA: Diagnosis not present

## 2022-09-06 DIAGNOSIS — N281 Cyst of kidney, acquired: Secondary | ICD-10-CM | POA: Diagnosis not present

## 2022-09-06 DIAGNOSIS — K7689 Other specified diseases of liver: Secondary | ICD-10-CM | POA: Diagnosis not present

## 2022-09-06 LAB — URINALYSIS, ROUTINE W REFLEX MICROSCOPIC
Bilirubin Urine: NEGATIVE
Glucose, UA: NEGATIVE mg/dL
Ketones, ur: NEGATIVE mg/dL
Nitrite: NEGATIVE
Protein, ur: NEGATIVE mg/dL
Specific Gravity, Urine: 1.008 (ref 1.005–1.030)
pH: 6 (ref 5.0–8.0)

## 2022-09-06 LAB — COMPREHENSIVE METABOLIC PANEL
ALT: 22 U/L (ref 0–44)
AST: 22 U/L (ref 15–41)
Albumin: 3.8 g/dL (ref 3.5–5.0)
Alkaline Phosphatase: 63 U/L (ref 38–126)
Anion gap: 7 (ref 5–15)
BUN: 11 mg/dL (ref 8–23)
CO2: 22 mmol/L (ref 22–32)
Calcium: 9.2 mg/dL (ref 8.9–10.3)
Chloride: 109 mmol/L (ref 98–111)
Creatinine, Ser: 0.64 mg/dL (ref 0.44–1.00)
GFR, Estimated: >60 mL/min (ref >60)
Glucose, Bld: 87 mg/dL (ref 70–99)
Potassium: 3.6 mmol/L (ref 3.5–5.1)
Sodium: 138 mmol/L (ref 135–145)
Total Bilirubin: 0.5 mg/dL (ref 0.3–1.2)
Total Protein: 7.4 g/dL (ref 6.5–8.1)

## 2022-09-06 LAB — CBC
HCT: 37.8 % (ref 36.0–46.0)
Hemoglobin: 12.2 g/dL (ref 12.0–15.0)
MCH: 26.8 pg (ref 26.0–34.0)
MCHC: 32.3 g/dL (ref 30.0–36.0)
MCV: 83.1 fL (ref 80.0–100.0)
Platelets: 225 10*3/uL (ref 150–400)
RBC: 4.55 MIL/uL (ref 3.87–5.11)
RDW: 17.3 % — ABNORMAL HIGH (ref 11.5–15.5)
WBC: 3.4 10*3/uL — ABNORMAL LOW (ref 4.0–10.5)
nRBC: 0 % (ref 0.0–0.2)

## 2022-09-06 LAB — LIPASE, BLOOD: Lipase: 31 U/L (ref 11–51)

## 2022-09-06 LAB — TROPONIN I (HIGH SENSITIVITY): Troponin I (High Sensitivity): 4 ng/L (ref <18)

## 2022-09-06 MED ORDER — ALUM & MAG HYDROXIDE-SIMETH 200-200-20 MG/5ML PO SUSP: 30.0000 mL | Freq: Once | ORAL | Status: DC

## 2022-09-06 MED ORDER — IOHEXOL 350 MG/ML SOLN
75.0000 mL | Freq: Once | INTRAVENOUS | Status: AC | PRN
  Administered 2022-09-06: 75 mL via INTRAVENOUS

## 2022-09-06 MED ORDER — MORPHINE SULFATE (PF) 4 MG/ML IV SOLN
4.0000 mg | Freq: Once | INTRAVENOUS | Status: AC
  Administered 2022-09-06: 4 mg via INTRAVENOUS
  Filled 2022-09-06: qty 1

## 2022-09-06 MED ORDER — ONDANSETRON HCL 4 MG/2ML IJ SOLN
4.0000 mg | Freq: Once | INTRAMUSCULAR | Status: AC
  Administered 2022-09-06: 4 mg via INTRAVENOUS
  Filled 2022-09-06: qty 2

## 2022-09-06 MED ORDER — DICYCLOMINE HCL 20 MG PO TABS: 20.0000 mg | ORAL_TABLET | Freq: Two times a day (BID) | ORAL | 0 refills | Status: DC

## 2022-09-06 MED ORDER — SIMETHICONE 80 MG PO CHEW: 80.0000 mg | CHEWABLE_TABLET | Freq: Four times a day (QID) | ORAL | 0 refills | Status: DC | PRN

## 2022-09-06 MED ORDER — LACTULOSE 10 GM/15ML PO SOLN: 10.0000 g | Freq: Every day | ORAL | 1 refills | Status: DC | PRN

## 2022-09-06 MED ORDER — LACTATED RINGERS IV BOLUS
1000.0000 mL | Freq: Once | INTRAVENOUS | Status: AC
  Administered 2022-09-06: 1000 mL via INTRAVENOUS

## 2022-09-06 MED ORDER — ONDANSETRON 4 MG PO TBDP: 4.0000 mg | ORAL_TABLET | Freq: Three times a day (TID) | ORAL | 0 refills | Status: DC | PRN

## 2022-09-06 MED ORDER — DICYCLOMINE HCL 10 MG/ML IM SOLN: 20.0000 mg | Freq: Once | INTRAMUSCULAR | Status: DC

## 2022-09-06 NOTE — ED Notes (Signed)
Walked patient to the bathroom patient did well patient is now back in bed on the monitor with call bell in reach  

## 2022-09-06 NOTE — ED Triage Notes (Signed)
Patient complains of diffuse lower abdominal pain that started a few a days ago. Patient states she was constipated for two days so she took miralax, colace, and linzess. Patient now complains of diarrhea and chest pain. Patient states she took pepto-bismal to treat diarrhea and abdominal pain, diarrhea improved but still present, pain unchanged. Patient is alert, oriented, and in no apparent distress at this time.

## 2022-09-06 NOTE — ED Notes (Signed)
Got patient into a gown on the monitor got patient some warm blankets patient is resting with call bell on reach

## 2022-09-06 NOTE — ED Provider Notes (Signed)
[facility] EMERGENCY DEPARTMENT Provider Note   CSN: [id number] Arrival date & time: 09/06/22  0753     History  Chief Complaint  Patient presents with   Abdominal Pain    NOLAH KRENZER is a 72 y.o. female.  HPI    Sharp pains on the right side Kept taking medications trying to have BM and then developed diarrhea This AM woke up with sharp pains today RUQ abdominal pain and right flank, going around to right back. At first felt liike thought had female problems, has not had intercourse in 8 months, -first had left lower quadrant pain.  Since took the linzess, miralaz,colace, took it all and "when it came it came" for 3 days of diarrhea.  Now feels like insides aching. Took pepto and it slowed down now but when tried to use bathroom this am had gas.  No vaginal discharge, bleeding, or dysuria.  Was having some chest pain middle after having diarrhea, eased down a lot since using bathroom but is there still a little bit.  Breathing had been acting up but feels better today.   Felt like was hot did not check temp No chills, no vomiting but did have some nausea       Past Medical History:  Diagnosis Date   Asthma in adult, mild intermittent, uncomplicated    Chronic anticoagulation    Chronic constipation    COVID-19 virus infection 10/19/2021   Eczema    GERD (gastroesophageal reflux disease)    H. pylori infection 09/24/2021   Identified on EGD biopsy.  Treatment will be discussed with her GI doctor.  SHe will need a very simple regimen which has no contraindications in combination with her existing medication regimen.     Hemoperitoneum 09/27/2021   Due to splenic lac occurring after colonoscopy on 09/24/21.  Xarelto resumed.    History of kidney stones    Hx of vaginal bleeding, resolved, ultrasound completed 10/11/2018   Hypertension    Laceration of spleen    Presumed complication of colonoscopy 09/24/21.  Pain and hemoperitoneum.  Xarelto resumed.  Short  term opioid pain management.     Personal history of atrial fibrillation, s/p ablation, now in NSR but remains on anticoaulation 11/26/2015   Followed in cardiology clinic     Past Surgical History:  Procedure Laterality Date   ATRIAL FIBRILLATION ABLATION N/A 09/30/2017   Procedure: [place];  Surgeon: Thompson Grayer, MD;  Location: [place] [facility];  Service: Cardiovascular;  Laterality: N/A;   BIOPSY  09/24/2021   Procedure: BIOPSY;  Surgeon: Sharyn Creamer, MD;  Location: [place];  Service: Gastroenterology;;   CHOLECYSTECTOMY N/A 12/08/2018   Procedure: LAPAROSCOPIC CHOLECYSTECTOMY WITH INTRAOPERATIVE CHOLANGIOGRAM;  Surgeon: Jovita Kussmaul, MD;  Location: [place];  Service: General;  Laterality: N/A;   COLONOSCOPY WITH PROPOFOL N/A 09/24/2021   Procedure: COLONOSCOPY WITH PROPOFOL;  Surgeon: Sharyn Creamer, MD;  Location: [place];  Service: Gastroenterology;  Laterality: N/A;   ERCP N/A 12/09/2018   Procedure: ENDOSCOPIC RETROGRADE CHOLANGIOPANCREATOGRAPHY (ERCP);  Surgeon: Carol Ada, MD;  Location: [place];  Service: Endoscopy;  Laterality: N/A;   ESOPHAGOGASTRODUODENOSCOPY (EGD) WITH PROPOFOL N/A 09/24/2021   Procedure: ESOPHAGOGASTRODUODENOSCOPY (EGD) WITH PROPOFOL;  Surgeon: Sharyn Creamer, MD;  Location: [place];  Service: Gastroenterology;  Laterality: N/A;   POLYPECTOMY  09/24/2021   Procedure: POLYPECTOMY;  Surgeon: Sharyn Creamer, MD;  Location: [place];  Service: Gastroenterology;;   Joan Mayans  12/09/2018  Procedure: SPHINCTEROTOMY;  Surgeon: Carol Ada, MD;  Location: [facility];  Service: Endoscopy;;   TONSILLECTOMY     TUBAL LIGATION      Home Medications Prior to Admission medications   Medication Sig Start Date End Date Taking? Authorizing Provider  dicyclomine (BENTYL) 20 MG tablet Take 1 tablet (20 mg total) by mouth 2 (two) times daily. 09/06/22  Yes Gareth Morgan, MD  ondansetron (ZOFRAN-ODT) 4 MG disintegrating  tablet Take 1 tablet (4 mg total) by mouth every 8 (eight) hours as needed for nausea or vomiting. 09/06/22  Yes Gareth Morgan, MD  alendronate (FOSAMAX) 70 MG tablet Take 1 tablet (70 mg total) by mouth every 7 (seven) days. Take with a full glass of water on an empty stomach. 06/23/22 06/23/23  Angelica Pou, MD  atorvastatin (LIPITOR) 40 MG tablet Take 1 tablet (40 mg total) by mouth daily. 08/27/21 08/27/22  Angelica Pou, MD  cetirizine (ZYRTEC) 10 MG tablet Take 10 mg by mouth daily.    [provider]  diltiazem (CARDIZEM) 60 MG tablet Take 1 tablet (60 mg total) by mouth every 6 (six) hours as needed (afib). 08/14/21   Sherran Needs, NP  diltiazem (CARTIA XT) 120 MG 24 hr capsule Take 1 capsule (120 mg total) by mouth 2 (two) times daily. 08/09/22   Sherran Needs, NP  docusate sodium (COLACE) 100 MG capsule Take 1 capsule (100 mg total) by mouth daily as needed for up to 180 doses for mild constipation. Patient not taking: Reported on 06/10/2022 06/09/22   Angelica Pou, MD  ezetimibe (ZETIA) 10 MG tablet Take 1 tablet (10 mg total) by mouth daily. 12/07/21 12/07/22  Angelica Pou, MD  fluticasone (FLOVENT HFA) 110 MCG/ACT inhaler Inhale 1 puff into the lungs daily. Inhale 1 puff into the lungs daily. 08/02/22   Angelica Pou, MD  fluticasone (FLOVENT HFA) 44 MCG/ACT inhaler Inhale 1 puff into the lungs daily. 09/08/21   Angelica Pou, MD  HYDROcodone-acetaminophen (NORCO/VICODIN) 5-325 MG tablet 1/2 to 1 tablet only when needed for severe pain, as often as every 6 hours. 08/31/22   Angelica Pou, MD  lactulose (CHRONULAC) 10 GM/15ML solution Take 15 mLs (10 g total) by mouth daily as needed for moderate constipation. 09/06/22   Gareth Morgan, MD  levalbuterol [facility] HFA) 45 MCG/ACT inhaler Inhale 1 puff into the lungs every 6 (six) hours as needed for shortness of breath. 03/04/22   Angelica Pou, MD  linaclotide [facility]) 145  MCG CAPS capsule Take 1 capsule (145 mcg total) by mouth daily before breakfast. Patient not taking: Reported on 06/10/2022 06/09/22   Yetta Flock, MD  losartan (COZAAR) 50 MG tablet Take 2 tablets (100 mg total) by mouth daily. 06/09/22 06/09/23  Angelica Pou, MD  methocarbamol (ROBAXIN) 750 MG tablet Take 1 tablet (750 mg total) by mouth 3 (three) times daily as needed (muscle spasm/pain). Patient not taking: Reported on 06/10/2022 05/09/22   Lajean Saver, MD  montelukast (SINGULAIR) 10 MG tablet Take 1 tablet (10 mg total) by mouth at bedtime. 03/04/22   Angelica Pou, MD  omeprazole (PRILOSEC) 40 MG capsule Take 1 capsule (40 mg total) by mouth in the morning and at bedtime. 01/01/22   Iona Beard, MD  polyethylene glycol (MIRALAX / GLYCOLAX) 17 g packet Take 17 g by mouth 2 (two) times daily. 09/29/21   Timothy Lasso, MD  polyethylene glycol (MIRALAX) 17 g packet Take 17 g by  mouth 2 (two) times daily for 14 days. 06/10/22 06/24/22  Elgie Congo, MD  potassium chloride (KLOR-CON M) 10 MEQ tablet Take 1 tablet (10 mEq total) by mouth daily. 08/12/22   Angelica Pou, MD  rivaroxaban (XARELTO) 20 MG TABS tablet Take 1 tablet (20 mg total) by mouth daily with supper. 01/12/22   Sherran Needs, NP  simethicone (GAS-X) 80 MG chewable tablet Chew 1 tablet (80 mg total) by mouth 4 (four) times daily as needed for up to 7 days for flatulence. 09/06/22 09/13/22  Gareth Morgan, MD  tiotropium (SPIRIVA) 18 MCG inhalation capsule Place 1 capsule (18 mcg total) into inhaler and inhale daily. 10/16/21   Angelica Pou, MD      Allergies    Albuterol, Percocet [oxycodone-acetaminophen], Celecoxib, and Tramadol    Review of Systems   Review of Systems  Physical Exam Updated Vital Signs BP 137/89   Pulse 73   Temp 98.2 F (36.8 C)   Resp 15   SpO2 100%  Physical Exam Vitals and nursing note reviewed.  Constitutional:      General: She is not in acute  distress.    Appearance: She is well-developed. She is not diaphoretic.  HENT:     Head: Normocephalic and atraumatic.  Eyes:     Conjunctiva/sclera: Conjunctivae normal.  Cardiovascular:     Rate and Rhythm: Normal rate and regular rhythm.     Heart sounds: Normal heart sounds. No murmur heard.    No friction rub. No gallop.  Pulmonary:     Effort: Pulmonary effort is normal. No respiratory distress.     Breath sounds: Normal breath sounds. No wheezing or rales.  Abdominal:     General: There is no distension.     Palpations: Abdomen is soft.     Tenderness: There is abdominal tenderness in the right upper quadrant, right lower quadrant and epigastric area. There is no guarding.  Musculoskeletal:        General: No tenderness.     Cervical back: Normal range of motion.  Skin:    General: Skin is warm and dry.     Findings: No erythema or rash.  Neurological:     Mental Status: She is alert and oriented to person, place, and time.     ED Results / Procedures / Treatments   Labs (all labs ordered are listed, but only abnormal results are displayed) Labs Reviewed  CBC - Abnormal; Notable for the following components:      Result Value   WBC 3.4 (*)    RDW 17.3 (*)    All other components within normal limits  URINALYSIS, ROUTINE W REFLEX MICROSCOPIC - Abnormal; Notable for the following components:   APPearance HAZY (*)    Hgb urine dipstick MODERATE (*)    Leukocytes,Ua SMALL (*)    Bacteria, UA RARE (*)    All other components within normal limits  LIPASE, BLOOD  COMPREHENSIVE METABOLIC PANEL  TROPONIN I (HIGH SENSITIVITY)  TROPONIN I (HIGH SENSITIVITY)    EKG EKG Interpretation  Date/Time:  Monday September 06 2022 07:59:58 EST Ventricular Rate:  80 PR Interval:  116 QRS Duration: 84 QT Interval:  386 QTC Calculation: 445 R Axis:   72 Text Interpretation: Normal sinus rhythm Normal ECG When compared with ECG of 10-Jul-2022 06:55, No significant change since  last tracing Confirmed by Gareth Morgan [phone]) on 09/06/2022 8:58:17 AM  Radiology CT ABDOMEN PELVIS W CONTRAST  Result Date: 09/06/2022 CLINICAL  DATA:  Right lower quadrant abdomen pain for few days. EXAM: CT ABDOMEN AND PELVIS WITH CONTRAST TECHNIQUE: Multidetector CT imaging of the abdomen and pelvis was performed using the standard protocol following bolus administration of intravenous contrast. RADIATION DOSE REDUCTION: This exam was performed according to the departmental dose-optimization program which includes automated exposure control, adjustment of the mA and/or kV according to patient size and/or use of iterative reconstruction technique. CONTRAST:  62m OMNIPAQUE IOHEXOL 350 MG/ML SOLN COMPARISON:  July 10, 2022, August 11, 2020 FINDINGS: Lower chest: Stable medial segment right lower lobe pulmonary nodule 5 mm unchanged compared to prior exam. Mild atelectasis of posterior right lung base is noted. Hepatobiliary: Stable liver cysts are identified. Prior cholecystectomy with postsurgical intra and extrahepatic biliary ductal dilatation unchanged. Pancreas: No focal pancreatic lesion.  Stable compared prior exam. Spleen: Normal in size without focal abnormality. Adrenals/Urinary Tract: Stable bilateral adrenal glands. Subcentimeter simple cysts identified within the left kidney. No follow-up is recommended. No hydronephrosis is identified bilaterally. The bladder is normal. Stomach/Bowel: Stomach is within normal limits. The appendix is not seen but no inflammation is noted around cecum. No evidence of bowel wall thickening, distention, or inflammatory changes. Diverticulosis of colon. Vascular/Lymphatic: Aortic atherosclerosis. No enlarged abdominal or pelvic lymph nodes. Reproductive: Uterus and bilateral adnexa are unremarkable. Other: Minimal free fluid is identified in the pelvis. Musculoskeletal: Degenerative joint changes of the spine are noted. IMPRESSION: 1. No acute abnormality  identified in the abdomen and pelvis. 2. The appendix is not seen but no inflammation is noted around cecum. Aortic Atherosclerosis (ICD10-I70.0). Electronically Signed   By: WAbelardo DieselM.D.   On: 09/06/2022 09:50   DG Chest 2 View  Result Date: 09/06/2022 CLINICAL DATA:  Right lateral chest pain and lower abdominal pain EXAM: CHEST - 2 VIEW COMPARISON:  Chest radiograph July 10, 2022. FINDINGS: The heart size and mediastinal contours are within normal limits. Aortic atherosclerosis. Pulmonary hyperinflation similar prior. Similar linear scarring in the right lung base no new focal airspace consolidation. No pleural effusion. No pneumothorax. The visualized skeletal structures are unremarkable. IMPRESSION: No active cardiopulmonary disease. Electronically Signed   By: JDahlia BailiffM.D.   On: 09/06/2022 08:21    Procedures Procedures    Medications Ordered in ED Medications  morphine (PF) 4 MG/ML injection 4 mg (4 mg Intravenous Given 09/06/22 0918)  ondansetron (ZOFRAN) injection 4 mg (4 mg Intravenous Given 09/06/22 0918)  lactated ringers bolus 1,000 mL (0 mLs Intravenous Stopped 09/06/22 1008)  iohexol (OMNIPAQUE) 350 MG/ML injection 75 mL (75 mLs Intravenous Contrast Given 09/06/22 02878    ED Course/ Medical Decision Making/ A&P                              89yofemale with history of chronic constipation, hypertension, atrial fibrillation s/p ablation on xarelto, prior splenic laceration presents with concern for abdominal pain.   DDx includes appendicitis, pancreatitis, choledocolithiasis, pyelonephritis, nephrolithiasis, diverticulitis, dissection, SBO, ACS.  Labs completed and personally evaluated interpreted by me.  Urinalysis shows no signs of urinary tract infection.  No sign of pancreatitis with normal lipase, no hepatitis or signs of biliary obstruction on CMP.  Electrolytes within normal limits.  Troponin within normal limits, doubt ACS. Low suspicion for PE on  anticoagulation.  CT abdomen pelvis was obtained given abdominal pain, shows no acute abnormality identified in the abdomen or pelvis, no inflammation around the cecum, no sign of bowel obstruction,  no hydronephrosis.  Chest x-ray shows no evidence of acute abnormalities.  EKG was completed and personally abided by me without significant changes from prior.  Do not see indication for admission or surgery at this time. Recommend continued supportive care, PCP/GI follow up. Reports lactulose has helped in the past with constipation, given for this as well as bentyl for stomach cramping and simethicone.           Final Clinical Impression(s) / ED Diagnoses Final diagnoses:  Right lower quadrant abdominal pain    Rx / DC Orders ED Discharge Orders          Ordered    dicyclomine (BENTYL) 20 MG tablet  2 times daily        09/06/22 1310    ondansetron (ZOFRAN-ODT) 4 MG disintegrating tablet  Every 8 hours PRN        09/06/22 1310    lactulose (CHRONULAC) 10 GM/15ML solution  Daily PRN        09/06/22 1310    simethicone (GAS-X) 80 MG chewable tablet  4 times daily PRN        09/06/22 1310              Gareth Morgan, MD 09/06/22 2250

## 2022-09-07 ENCOUNTER — Other Ambulatory Visit (HOSPITAL_COMMUNITY): Payer: Self-pay | Admitting: *Deleted

## 2022-09-07 MED ORDER — DILTIAZEM HCL 60 MG PO TABS: 60.0000 mg | ORAL_TABLET | Freq: Four times a day (QID) | ORAL | 1 refills | Status: DC | PRN

## 2022-09-09 ENCOUNTER — Telehealth: Payer: Self-pay | Admitting: Gastroenterology

## 2022-09-09 NOTE — Telephone Encounter (Signed)
Returned call to patient. She states that she went to ED earlier this week for constipation. Pt was prescribed Lactulose to take PRN for moderate constipation. Pt states that she has not had a BM since Sunday. Pt denies taking Miralax 2 capfuls BID and Linzess 145 mcg PRN as previously recommended by Dr. Havery Moros. Pt states that she uses Dulcolax sometimes. I told pt that she purchase an enema OTC and use today, she should continue Lactulose BID and we will see her for a follow up tomorrow. Pt verbalized understanding and had no concerns at the end of the call.

## 2022-09-09 NOTE — Progress Notes (Signed)
09/10/2022 [name] [id number] 08/11/1951  Referring provider: Angelica Pou, MD Primary GI doctor: Dr. Havery Moros  ASSESSMENT AND PLAN:   Chronic idiopathic constipation 3 ER visit for CIC, likely worse from recent rare norco Get Xray Avoid narcotics, talk with PCP.  Linzess not helping, dulcolax, miralax, tried Will do miralax purge and start on motegrity - Increase fiber/ water intake, decrease caffeine, increase activity level. - Colonoscopy 09/3021 3-4 mm polyps, focal active colitis, negative crypt/dysplasia Close follow up  Patient Care Team: Angelica Pou, MD as PCP - General (Internal Medicine) Thompson Grayer, MD as PCP - Electrophysiology (Cardiology) Jovita Kussmaul, MD as Consulting Physician (General Surgery)  HISTORY OF PRESENT ILLNESS: 72 y.o. female with a past medical history of atrial fibrillation on Xarelto, GERD, lap chole 2020 with ERCP same year with sphincterotomy, hemorrhoids, adenomatous polyps, constipation and others listed below presents for evaluation of CIC.   10/19/2021 office visit with Dr. Havery Moros for hospitalization follow-up. Was complaining of constipation, failed lactulose, miralax does not help, tried linzess 145 mcg Treated with Pylera, negative H pylori eradication study 01/04/22.  She states she was doing well with Bm's but then she started to have constipation again with AB pain.  06/10/22 ER for constipation and AB pain, CT unremarkable other than stool burden 11/18 ER for constipation, LFTs and lipase negative CT with persistent stool burden, started on lactulose.  09/06/2022 ER visit right lower AB pain and diarrhea  Did enema yesterday with mostly water but small volume stools.  She has hydrocodone on her list, uncertain when she got the prescription. Took 1/2 of the norco day before yesterday, takes about once a week, tries to not take at all, no new medications.   EGD 09/24/2021: Normal esophagus Localized  inflammation characterized by congestion (edema), erosions and erythema was found in the gastric antrum. Biopsies were taken with a cold forceps for histology. The examined duodenum was normal. Colonoscopy 09/24/2021: The examined portion of the ileum was normal. - Two 3 to 4 mm polyps in the descending colon and in the ascending colon, removed with a cold snare. Resected and retrieved. - Erythematous mucosa in the rectum, in the sigmoid colon, in the descending colon and in the transverse colon. Biopsied. - Non-bleeding internal hemorrhoids.  FINAL MICROSCOPIC DIAGNOSIS:   A. STOMACH, BIOPSY:  -  Active chronic gastritis with associated Helicobacter pylori  infection (H. pylori organisms visible on HE).  -  Negative for malignancy.  -  Focal intestinal metaplasia.   B. COLON, DESCENDING AND ASCENDING, POLYPECTOMY:  -  Focal mild polypoid crypt alteration suggestive of hyperplastic polyp  goblet-cell rich type.  -  Mucosal lymphoid aggregate.  -  Negative for adenomatous change and polypoid epithelial serration.  -  Negative for malignancy.   C. COLON, RANDOM, BIOPSY:  -  Focal mild active colitis.  -  Negative for granulomas and crypt architectural distortion.  -  Negative CMV immunohistochemical (IHC) stain.  -  Negative for adenomatous change and polypoid epithelial serration.  -  Negative for malignancy.   D. RECTUM, BIOPSY:  -  Colorectal mucosa with no specific pathologic diagnosis.  -  Negative for active proctitis.  -  Negative for adenomatous change and polypoid epithelial serration.  -  Negative for malignancy.     She  reports that she quit smoking about 6 years ago. Her smoking use included cigarettes. She has never used smokeless tobacco. She reports that she does not currently use  alcohol. She reports that she does not currently use drugs after having used the following drugs: Marijuana.  Current Medications:   Current Outpatient Medications (Endocrine &  Metabolic):    alendronate (FOSAMAX) 70 MG tablet, Take 1 tablet (70 mg total) by mouth every 7 (seven) days. Take with a full glass of water on an empty stomach.  Current Outpatient Medications (Cardiovascular):    diltiazem (CARDIZEM) 60 MG tablet, Take 1 tablet (60 mg total) by mouth every 6 (six) hours as needed (afib).   diltiazem (CARTIA XT) 120 MG 24 hr capsule, Take 1 capsule (120 mg total) by mouth 2 (two) times daily.   ezetimibe (ZETIA) 10 MG tablet, Take 1 tablet (10 mg total) by mouth daily.   losartan (COZAAR) 50 MG tablet, Take 2 tablets (100 mg total) by mouth daily.   atorvastatin (LIPITOR) 40 MG tablet, Take 1 tablet (40 mg total) by mouth daily.  Current Outpatient Medications (Respiratory):    cetirizine (ZYRTEC) 10 MG tablet, Take 10 mg by mouth daily.   fluticasone (FLOVENT HFA) 110 MCG/ACT inhaler, Inhale 1 puff into the lungs daily. Inhale 1 puff into the lungs daily.   fluticasone (FLOVENT HFA) 44 MCG/ACT inhaler, Inhale 1 puff into the lungs daily.   levalbuterol (XOPENEX HFA) 45 MCG/ACT inhaler, Inhale 1 puff into the lungs every 6 (six) hours as needed for shortness of breath.   montelukast (SINGULAIR) 10 MG tablet, Take 1 tablet (10 mg total) by mouth at bedtime.   tiotropium (SPIRIVA) 18 MCG inhalation capsule, Place 1 capsule (18 mcg total) into inhaler and inhale daily.  Current Outpatient Medications (Analgesics):    HYDROcodone-acetaminophen (NORCO/VICODIN) 5-325 MG tablet, 1/2 to 1 tablet only when needed for severe pain, as often as every 6 hours.  Current Outpatient Medications (Hematological):    rivaroxaban (XARELTO) 20 MG TABS tablet, Take 1 tablet (20 mg total) by mouth daily with supper.  Current Outpatient Medications (Other):    dicyclomine (BENTYL) 20 MG tablet, Take 1 tablet (20 mg total) by mouth 2 (two) times daily.   docusate sodium (COLACE) 100 MG capsule, Take 1 capsule (100 mg total) by mouth daily as needed for up to 180 doses for mild  constipation.   lactulose (CHRONULAC) 10 GM/15ML solution, Take 15 mLs (10 g total) by mouth daily as needed for moderate constipation.   methocarbamol (ROBAXIN) 750 MG tablet, Take 1 tablet (750 mg total) by mouth 3 (three) times daily as needed (muscle spasm/pain).   omeprazole (PRILOSEC) 40 MG capsule, Take 1 capsule (40 mg total) by mouth in the morning and at bedtime.   ondansetron (ZOFRAN-ODT) 4 MG disintegrating tablet, Take 1 tablet (4 mg total) by mouth every 8 (eight) hours as needed for nausea or vomiting.   polyethylene glycol (MIRALAX / GLYCOLAX) 17 g packet, Take 17 g by mouth 2 (two) times daily.   potassium chloride (KLOR-CON M) 10 MEQ tablet, Take 1 tablet (10 mEq total) by mouth daily.   Prucalopride Succinate (MOTEGRITY) 2 MG TABS, Take 1 tablet (2 mg total) by mouth daily.   simethicone (GAS-X) 80 MG chewable tablet, Chew 1 tablet (80 mg total) by mouth 4 (four) times daily as needed for up to 7 days for flatulence.  Medical History:  Past Medical History:  Diagnosis Date   Asthma in adult, mild intermittent, uncomplicated    Chronic anticoagulation    Chronic constipation    COVID-19 virus infection 10/19/2021   Eczema    GERD (gastroesophageal reflux disease)  H. pylori infection 09/24/2021   Identified on EGD biopsy.  Treatment will be discussed with her GI doctor.  SHe will need a very simple regimen which has no contraindications in combination with her existing medication regimen.     Hemoperitoneum 09/27/2021   Due to splenic lac occurring after colonoscopy on 09/24/21.  Xarelto resumed.    History of kidney stones    Hx of vaginal bleeding, resolved, ultrasound completed 10/11/2018   Hypertension    Laceration of spleen    Presumed complication of colonoscopy 09/24/21.  Pain and hemoperitoneum.  Xarelto resumed.  Short term opioid pain management.     Personal history of atrial fibrillation, s/p ablation, now in NSR but remains on anticoaulation 11/26/2015   Followed  in cardiology clinic    Allergies:  Allergies  Allergen Reactions   Albuterol Palpitations    Caused afib    Percocet [Oxycodone-Acetaminophen] Shortness Of Breath, Itching and Anxiety    Makes jittery, difficulty breathing, itching   Celecoxib Rash   Tramadol Palpitations    Ok to trial again 09/29/2021 and f/u HR     Surgical History:  She  has a past surgical history that includes Tubal ligation; Tonsillectomy; ATRIAL FIBRILLATION ABLATION (N/A, 09/30/2017); ERCP (N/A, 12/09/2018); sphincterotomy (12/09/2018); Cholecystectomy (N/A, 12/08/2018); Colonoscopy with propofol (N/A, 09/24/2021); Esophagogastroduodenoscopy (egd) with propofol (N/A, 09/24/2021); biopsy (09/24/2021); and polypectomy (09/24/2021). Family History:  Her family history includes Breast cancer in her sister; Cancer in her sister, sister, and sister; Heart attack in her mother; Lung cancer in her brother.  REVIEW OF SYSTEMS  : All other systems reviewed and negative except where noted in the History of Present Illness.  PHYSICAL EXAM: BP (!) 140/80   Pulse 89   Ht '5\' 7"'$  (1.702 m)   Wt 137 lb 4 oz (62.3 kg)   BMI 21.50 kg/m  General:   Pleasant, well developed female in no acute distress Head:   Normocephalic and atraumatic. Eyes:  sclerae anicteric,conjunctive pink  Heart:   regular rate and rhythm Pulm:  Clear anteriorly; no wheezing Abdomen:   Soft, Non-distended AB, Hypoactive bowel sounds. mild tenderness in the lower abdomen. Without guarding and Without rebound, No organomegaly appreciated. Rectal: Not evaluated Extremities:  Without edema. Msk: Symmetrical without gross deformities. Peripheral pulses intact.  Neurologic:  Alert and  oriented x4;  No focal deficits.  Skin:   Dry and intact without significant lesions or rashes. Psychiatric:  Cooperative. Normal mood and affect.  RELEVANT LABS AND IMAGING: CBC    Component Value Date/Time   WBC 3.4 (L) 09/06/2022 0800   RBC 4.55 09/06/2022 0800   HGB 12.2  09/06/2022 0800   HGB 9.3 (L) 10/01/2021 1142   HCT 37.8 09/06/2022 0800   HCT 28.0 (L) 10/01/2021 1142   PLT 225 09/06/2022 0800   PLT 334 10/01/2021 1142   MCV 83.1 09/06/2022 0800   MCV 81 10/01/2021 1142   MCH 26.8 09/06/2022 0800   MCHC 32.3 09/06/2022 0800   RDW 17.3 (H) 09/06/2022 0800   RDW 15.4 10/01/2021 1142   LYMPHSABS 1.0 04/18/2022 0943   LYMPHSABS 1.4 09/16/2017 1056   MONOABS 0.4 04/18/2022 0943   EOSABS 0.0 04/18/2022 0943   EOSABS 0.2 09/16/2017 1056   BASOSABS 0.0 04/18/2022 0943   BASOSABS 0.0 09/16/2017 1056    CMP     Component Value Date/Time   NA 138 09/06/2022 0800   NA 140 10/01/2021 1142   K 3.6 09/06/2022 0800   CL 109 09/06/2022  0800   CO2 22 09/06/2022 0800   GLUCOSE 87 09/06/2022 0800   BUN 11 09/06/2022 0800   BUN 6 (L) 10/01/2021 1142   CREATININE 0.64 09/06/2022 0800   CALCIUM 9.2 09/06/2022 0800   PROT 7.4 09/06/2022 0800   ALBUMIN 3.8 09/06/2022 0800   AST 22 09/06/2022 0800   ALT 22 09/06/2022 0800   ALKPHOS 63 09/06/2022 0800   BILITOT 0.5 09/06/2022 0800   GFRNONAA >60 09/06/2022 0800   GFRAA 104 07/24/2020 [place], PA-C 3:40 PM

## 2022-09-09 NOTE — Telephone Encounter (Signed)
Patient called states she was seen at the ED 09/06/2022 because she was having abdominal pain and could not have a BM. They gave her medication for help her (not sure which medication)but she said it is not helping. Seeking further advise.

## 2022-09-10 ENCOUNTER — Encounter: Payer: Self-pay | Admitting: Physician Assistant

## 2022-09-10 ENCOUNTER — Ambulatory Visit (INDEPENDENT_AMBULATORY_CARE_PROVIDER_SITE_OTHER)
Admission: RE | Admit: 2022-09-10 | Discharge: 2022-09-10 | Disposition: A | Discharge status: Home / Self Care | Payer: 59 | Source: Ambulatory Visit | Attending: Physician Assistant | Admitting: Physician Assistant

## 2022-09-10 ENCOUNTER — Telehealth: Payer: Self-pay

## 2022-09-10 ENCOUNTER — Ambulatory Visit (INDEPENDENT_AMBULATORY_CARE_PROVIDER_SITE_OTHER): Payer: 59 | Admitting: Physician Assistant

## 2022-09-10 VITALS — BP 140/80 | HR 89 | Ht 67.0 in | Wt 137.2 lb

## 2022-09-10 DIAGNOSIS — K5900 Constipation, unspecified: Secondary | ICD-10-CM | POA: Diagnosis not present

## 2022-09-10 DIAGNOSIS — K5904 Chronic idiopathic constipation: Secondary | ICD-10-CM

## 2022-09-10 MED ORDER — MOTEGRITY 2 MG PO TABS: 2.0000 mg | ORAL_TABLET | Freq: Every day | ORAL | 0 refills | Status: DC

## 2022-09-10 NOTE — Patient Outreach (Signed)
  Care Coordination   09/10/2022 Name: [name] MRN: [id number] DOB: [birth year]   Care Coordination Outreach Attempts:  A third unsuccessful outreach was attempted today to offer the patient with information about available care coordination services as a benefit of their health plan.   Follow Up Plan:  No further outreach attempts will be made at this time. We have been unable to contact the patient to offer or enroll patient in care coordination services  Encounter Outcome:  No Answer   Care Coordination Interventions:  No, not indicated    Jone Baseman, RN, MSN [facility] Care Management Coordinator Direct Line [phone]

## 2022-09-10 NOTE — Patient Instructions (Addendum)
Your provider has requested that you have an abdominal x ray before leaving today. Please go to the basement floor to our Radiology department for the test.  Please do the following: Purchase a bottle of Miralax over the counter as well as a box of 5 mg dulcolax tablets. Take 4 dulcolax tablets. Wait 1 hour. You will then drink 6-8 capfuls of Miralax mixed in an adequate amount of water/juice/gatorade (you may choose which of these liquids to drink) over the next 2-3 hours. You should expect results within 1 to 6 hours after completing the bowel purge. Go to the er if you have severe AB pain, can not pass gas or stool in over 12 hours, can not hold down any food.   After the bowel purge the next day start on motegrity samples once daily and let us know if this helps.   Medication Samples have been provided to the patient.  Drug name: Motegrity       Strength: '2mg'$           LOT: [id number]  Exp.Date: 05-2024  Dosing instructions:  once a day, anytime, with or without food.  The patient has been instructed regarding the correct time, dose, and frequency of taking this medication, including desired effects and most common side effects.   Elias Else 3:18 PM 09/10/2022

## 2022-09-13 NOTE — Progress Notes (Signed)
Agree with assessment and plan as outlined.  

## 2022-09-14 ENCOUNTER — Emergency Department (HOSPITAL_COMMUNITY): Payer: 59

## 2022-09-14 ENCOUNTER — Telehealth: Payer: Self-pay | Admitting: Physician Assistant

## 2022-09-14 ENCOUNTER — Other Ambulatory Visit: Payer: Self-pay

## 2022-09-14 ENCOUNTER — Emergency Department (HOSPITAL_COMMUNITY)
Admission: EM | Admit: 2022-09-14 | Discharge: 2022-09-14 | Disposition: A | Discharge status: Home / Self Care | Payer: 59 | Attending: Emergency Medicine | Admitting: Emergency Medicine

## 2022-09-14 DIAGNOSIS — R109 Unspecified abdominal pain: Secondary | ICD-10-CM | POA: Diagnosis present

## 2022-09-14 DIAGNOSIS — Z79899 Other long term (current) drug therapy: Secondary | ICD-10-CM | POA: Insufficient documentation

## 2022-09-14 DIAGNOSIS — R079 Chest pain, unspecified: Secondary | ICD-10-CM | POA: Insufficient documentation

## 2022-09-14 DIAGNOSIS — R1030 Lower abdominal pain, unspecified: Secondary | ICD-10-CM | POA: Diagnosis not present

## 2022-09-14 DIAGNOSIS — Z7901 Long term (current) use of anticoagulants: Secondary | ICD-10-CM | POA: Insufficient documentation

## 2022-09-14 DIAGNOSIS — K5901 Slow transit constipation: Secondary | ICD-10-CM | POA: Diagnosis not present

## 2022-09-14 DIAGNOSIS — I4891 Unspecified atrial fibrillation: Secondary | ICD-10-CM | POA: Diagnosis not present

## 2022-09-14 DIAGNOSIS — J439 Emphysema, unspecified: Secondary | ICD-10-CM | POA: Diagnosis not present

## 2022-09-14 DIAGNOSIS — R188 Other ascites: Secondary | ICD-10-CM | POA: Diagnosis not present

## 2022-09-14 DIAGNOSIS — I10 Essential (primary) hypertension: Secondary | ICD-10-CM | POA: Diagnosis not present

## 2022-09-14 DIAGNOSIS — I771 Stricture of artery: Secondary | ICD-10-CM | POA: Diagnosis not present

## 2022-09-14 DIAGNOSIS — R0789 Other chest pain: Secondary | ICD-10-CM | POA: Diagnosis not present

## 2022-09-14 DIAGNOSIS — I878 Other specified disorders of veins: Secondary | ICD-10-CM | POA: Diagnosis not present

## 2022-09-14 LAB — COMPREHENSIVE METABOLIC PANEL
ALT: 19 U/L (ref 0–44)
AST: 28 U/L (ref 15–41)
Albumin: 3.8 g/dL (ref 3.5–5.0)
Alkaline Phosphatase: 58 U/L (ref 38–126)
Anion gap: 12 (ref 5–15)
BUN: 12 mg/dL (ref 8–23)
CO2: 23 mmol/L (ref 22–32)
Calcium: 9.2 mg/dL (ref 8.9–10.3)
Chloride: 105 mmol/L (ref 98–111)
Creatinine, Ser: 0.82 mg/dL (ref 0.44–1.00)
GFR, Estimated: >60 mL/min (ref >60)
Glucose, Bld: 152 mg/dL — ABNORMAL HIGH (ref 70–99)
Potassium: 2.9 mmol/L — ABNORMAL LOW (ref 3.5–5.1)
Sodium: 140 mmol/L (ref 135–145)
Total Bilirubin: 0.6 mg/dL (ref 0.3–1.2)
Total Protein: 6.8 g/dL (ref 6.5–8.1)

## 2022-09-14 LAB — URINALYSIS, ROUTINE W REFLEX MICROSCOPIC
Bilirubin Urine: NEGATIVE
Glucose, UA: NEGATIVE mg/dL
Hgb urine dipstick: NEGATIVE
Ketones, ur: NEGATIVE mg/dL
Leukocytes,Ua: NEGATIVE
Nitrite: NEGATIVE
Protein, ur: NEGATIVE mg/dL
Specific Gravity, Urine: 1.01 (ref 1.005–1.030)
pH: 8 (ref 5.0–8.0)

## 2022-09-14 LAB — CBC WITH DIFFERENTIAL/PLATELET
Abs Immature Granulocytes: 0.01 10*3/uL (ref 0.00–0.07)
Basophils Absolute: 0 10*3/uL (ref 0.0–0.1)
Basophils Relative: 0 %
Eosinophils Absolute: 0.1 10*3/uL (ref 0.0–0.5)
Eosinophils Relative: 4 %
HCT: 36.7 % (ref 36.0–46.0)
Hemoglobin: 11.7 g/dL — ABNORMAL LOW (ref 12.0–15.0)
Immature Granulocytes: 0 %
Lymphocytes Relative: 43 %
Lymphs Abs: 1.2 10*3/uL (ref 0.7–4.0)
MCH: 26.6 pg (ref 26.0–34.0)
MCHC: 31.9 g/dL (ref 30.0–36.0)
MCV: 83.4 fL (ref 80.0–100.0)
Monocytes Absolute: 0.3 10*3/uL (ref 0.1–1.0)
Monocytes Relative: 10 %
Neutro Abs: 1.2 10*3/uL — ABNORMAL LOW (ref 1.7–7.7)
Neutrophils Relative %: 43 %
Platelets: 225 10*3/uL (ref 150–400)
RBC: 4.4 MIL/uL (ref 3.87–5.11)
RDW: 16.8 % — ABNORMAL HIGH (ref 11.5–15.5)
WBC: 2.8 10*3/uL — ABNORMAL LOW (ref 4.0–10.5)
nRBC: 0 % (ref 0.0–0.2)

## 2022-09-14 LAB — TROPONIN I (HIGH SENSITIVITY)
Troponin I (High Sensitivity): 5 ng/L (ref <18)
Troponin I (High Sensitivity): 5 ng/L (ref <18)

## 2022-09-14 LAB — LIPASE, BLOOD: Lipase: 39 U/L (ref 11–51)

## 2022-09-14 MED ORDER — FENTANYL CITRATE PF 50 MCG/ML IJ SOSY
25.0000 ug | PREFILLED_SYRINGE | Freq: Once | INTRAMUSCULAR | Status: AC
  Administered 2022-09-14: 25 ug via INTRAVENOUS
  Filled 2022-09-14: qty 1

## 2022-09-14 MED ORDER — POTASSIUM CHLORIDE CRYS ER 20 MEQ PO TBCR
40.0000 meq | EXTENDED_RELEASE_TABLET | Freq: Once | ORAL | Status: AC
  Administered 2022-09-14: 40 meq via ORAL
  Filled 2022-09-14: qty 2

## 2022-09-14 MED ORDER — IOHEXOL 350 MG/ML SOLN
75.0000 mL | Freq: Once | INTRAVENOUS | Status: AC | PRN
  Administered 2022-09-14: 75 mL via INTRAVENOUS

## 2022-09-14 NOTE — ED Notes (Signed)
Discharge instructions reviewed with patient. Patient denies any questions or concerns at this time. Patient out to lobby.

## 2022-09-14 NOTE — ED Provider Notes (Signed)
[facility] Provider Note   CSN: [id number] Arrival date & time: 09/14/22  0303     History  Chief Complaint  Patient presents with   Chest Pain    [name] is a 72 y.o. female with medical history of GERD, hypertension, eczema, chronic constipation, A-fib on anticoagulation.  Patient presents to ED for evaluation of chest pain, constipation, abdominal pain.  Patient reports that around 3 AM last night she woke up with centralized chest pain described as a pressure.  The patient reports that after about 30 minutes this chest pain radiated down into her right side and then localized in her abdomen.  Patient states that she did not experience any shortness of breath, syncopal events, lightheadedness or dizziness during this chest pain event.  Patient denies any nausea or vomiting.  Patient reports that she was recently seen by GI doctor for constipation and provided bowel regimen.  Patient reports that she took this bowel regimen on 1/19, had very large stool as a result.  Patient states that ever since 1/19, for the last 3 days, she has not been able to have a bowel movement.  Patient denies history of abdominal surgeries, denies history of bowel obstructions.  The patient reports that she is still passing gas.  Patient denies any fevers, flank pain, dysuria.  Patient does report she has a "pressure" in her lower abdomen.   Chest Pain Associated symptoms: abdominal pain   Associated symptoms: no dizziness, no fever, no nausea, no shortness of breath and no vomiting        Home Medications Prior to Admission medications   Medication Sig Start Date End Date Taking? Authorizing Provider  alendronate (FOSAMAX) 70 MG tablet Take 1 tablet (70 mg total) by mouth every 7 (seven) days. Take with a full glass of water on an empty stomach. 06/23/22 06/23/23  Angelica Pou, MD  atorvastatin (LIPITOR) 40 MG tablet Take 1 tablet (40 mg total) by  mouth daily. 08/27/21 08/27/22  Angelica Pou, MD  cetirizine (ZYRTEC) 10 MG tablet Take 10 mg by mouth daily.    [provider]  dicyclomine (BENTYL) 20 MG tablet Take 1 tablet (20 mg total) by mouth 2 (two) times daily. 09/06/22   Gareth Morgan, MD  diltiazem (CARDIZEM) 60 MG tablet Take 1 tablet (60 mg total) by mouth every 6 (six) hours as needed (afib). 09/07/22   Sherran Needs, NP  diltiazem (CARTIA XT) 120 MG 24 hr capsule Take 1 capsule (120 mg total) by mouth 2 (two) times daily. 08/09/22   Sherran Needs, NP  docusate sodium (COLACE) 100 MG capsule Take 1 capsule (100 mg total) by mouth daily as needed for up to 180 doses for mild constipation. 06/09/22   Angelica Pou, MD  ezetimibe (ZETIA) 10 MG tablet Take 1 tablet (10 mg total) by mouth daily. 12/07/21 12/07/22  Angelica Pou, MD  fluticasone (FLOVENT HFA) 110 MCG/ACT inhaler Inhale 1 puff into the lungs daily. Inhale 1 puff into the lungs daily. 08/02/22   Angelica Pou, MD  fluticasone (FLOVENT HFA) 44 MCG/ACT inhaler Inhale 1 puff into the lungs daily. 09/08/21   Angelica Pou, MD  HYDROcodone-acetaminophen (NORCO/VICODIN) 5-325 MG tablet 1/2 to 1 tablet only when needed for severe pain, as often as every 6 hours. 08/31/22   Angelica Pou, MD  lactulose (CHRONULAC) 10 GM/15ML solution Take 15 mLs (10 g total) by mouth daily as  needed for moderate constipation. 09/06/22   Gareth Morgan, MD  levalbuterol [facility] HFA) 45 MCG/ACT inhaler Inhale 1 puff into the lungs every 6 (six) hours as needed for shortness of breath. 03/04/22   Angelica Pou, MD  losartan (COZAAR) 50 MG tablet Take 2 tablets (100 mg total) by mouth daily. 06/09/22 06/09/23  Angelica Pou, MD  methocarbamol (ROBAXIN) 750 MG tablet Take 1 tablet (750 mg total) by mouth 3 (three) times daily as needed (muscle spasm/pain). 05/09/22   Lajean Saver, MD  montelukast (SINGULAIR) 10 MG tablet Take 1 tablet (10 mg  total) by mouth at bedtime. 03/04/22   Angelica Pou, MD  omeprazole (PRILOSEC) 40 MG capsule Take 1 capsule (40 mg total) by mouth in the morning and at bedtime. 01/01/22   Iona Beard, MD  ondansetron (ZOFRAN-ODT) 4 MG disintegrating tablet Take 1 tablet (4 mg total) by mouth every 8 (eight) hours as needed for nausea or vomiting. 09/06/22   Gareth Morgan, MD  polyethylene glycol (MIRALAX / GLYCOLAX) 17 g packet Take 17 g by mouth 2 (two) times daily. 09/29/21   Timothy Lasso, MD  potassium chloride (KLOR-CON M) 10 MEQ tablet Take 1 tablet (10 mEq total) by mouth daily. 08/12/22   Angelica Pou, MD  Prucalopride Succinate (MOTEGRITY) 2 MG TABS Take 1 tablet (2 mg total) by mouth daily. 09/10/22 12/09/22  Vladimir Crofts, PA-C  rivaroxaban (XARELTO) 20 MG TABS tablet Take 1 tablet (20 mg total) by mouth daily with supper. 01/12/22   Sherran Needs, NP  simethicone (GAS-X) 80 MG chewable tablet Chew 1 tablet (80 mg total) by mouth 4 (four) times daily as needed for up to 7 days for flatulence. 09/06/22 09/13/22  Gareth Morgan, MD  tiotropium (SPIRIVA) 18 MCG inhalation capsule Place 1 capsule (18 mcg total) into inhaler and inhale daily. 10/16/21   Angelica Pou, MD      Allergies    Albuterol, Percocet [oxycodone-acetaminophen], Celecoxib, and Tramadol    Review of Systems   Review of Systems  Constitutional:  Negative for fever.  Respiratory:  Negative for shortness of breath.   Cardiovascular:  Positive for chest pain.  Gastrointestinal:  Positive for abdominal pain and constipation. Negative for nausea and vomiting.  Genitourinary:  Negative for dysuria and flank pain.  Neurological:  Negative for dizziness, syncope and light-headedness.  All other systems reviewed and are negative.   Physical Exam Updated Vital Signs BP (!) 147/81   Pulse 71   Temp 98.2 F (36.8 C)   Resp 17   Ht '5\' 7"'$  (1.702 m)   Wt 62.6 kg   SpO2 99%   BMI 21.61 kg/m  Physical  Exam Vitals and nursing note reviewed.  Constitutional:      General: She is not in acute distress.    Appearance: Normal appearance. She is not ill-appearing, toxic-appearing or diaphoretic.  HENT:     Head: Normocephalic and atraumatic.     Nose: Nose normal. No congestion.     Mouth/Throat:     Mouth: Mucous membranes are moist.     Pharynx: Oropharynx is clear.  Eyes:     Extraocular Movements: Extraocular movements intact.     Conjunctiva/sclera: Conjunctivae normal.     Pupils: Pupils are equal, round, and reactive to light.  Cardiovascular:     Rate and Rhythm: Normal rate and regular rhythm.  Pulmonary:     Effort: Pulmonary effort is normal.     Breath sounds: Normal  breath sounds. No wheezing.  Abdominal:     General: Abdomen is flat. Bowel sounds are normal.     Palpations: Abdomen is soft.     Tenderness: There is no abdominal tenderness.  Musculoskeletal:     Cervical back: Normal range of motion and neck supple. No tenderness.     Right lower leg: No edema.     Left lower leg: No edema.  Skin:    General: Skin is warm and dry.     Capillary Refill: Capillary refill takes less than 2 seconds.  Neurological:     Mental Status: She is alert and oriented to person, place, and time.     ED Results / Procedures / Treatments   Labs (all labs ordered are listed, but only abnormal results are displayed) Labs Reviewed  COMPREHENSIVE METABOLIC PANEL - Abnormal; Notable for the following components:      Result Value   Potassium 2.9 (*)    Glucose, Bld 152 (*)    All other components within normal limits  CBC WITH DIFFERENTIAL/PLATELET - Abnormal; Notable for the following components:   WBC 2.8 (*)    Hemoglobin 11.7 (*)    RDW 16.8 (*)    Neutro Abs 1.2 (*)    All other components within normal limits  LIPASE, BLOOD  URINALYSIS, ROUTINE W REFLEX MICROSCOPIC  TROPONIN I (HIGH SENSITIVITY)  TROPONIN I (HIGH SENSITIVITY)    EKG EKG  Interpretation  Date/Time:  Tuesday September 14 2022 03:20:11 EST Ventricular Rate:  75 PR Interval:  132 QRS Duration: 88 QT Interval:  402 QTC Calculation: 448 R Axis:   63 Text Interpretation: Normal sinus rhythm Normal ECG When compared with ECG of 06-Sep-2022 07:59, No significant change was found Confirmed by Delora Fuel ([id number]) on 09/14/2022 4:48:08 AM  Radiology CT ABDOMEN PELVIS W CONTRAST  Result Date: 09/14/2022 CLINICAL DATA:  Bowel obstruction suspected EXAM: CT ABDOMEN AND PELVIS WITH CONTRAST TECHNIQUE: Multidetector CT imaging of the abdomen and pelvis was performed using the standard protocol following bolus administration of intravenous contrast. RADIATION DOSE REDUCTION: This exam was performed according to the departmental dose-optimization program which includes automated exposure control, adjustment of the mA and/or kV according to patient size and/or use of iterative reconstruction technique. CONTRAST:  74m OMNIPAQUE IOHEXOL 350 MG/ML SOLN COMPARISON:  Same day abdominal x-ray.  CT 09/06/2022 FINDINGS: Lower chest: No new or acute abnormality. Hepatobiliary: Unchanged hepatic cysts. No new focal liver abnormality. Prior cholecystectomy. No biliary dilatation. Pancreas: Unremarkable. No pancreatic ductal dilatation or surrounding inflammatory changes. Spleen: Normal in size without focal abnormality. Adrenals/Urinary Tract: Unremarkable adrenal glands. Kidneys enhance symmetrically without solid lesion, stone, or hydronephrosis. Ureters are nondilated. Urinary bladder appears unremarkable for the degree of distention. Stomach/Bowel: Stomach within normal limits. No abnormally dilated loops of bowel to suggest obstruction. Fecalization of distal small bowel content. Moderate volume stool throughout the colon. No focal bowel wall thickening or inflammatory changes are seen. Vascular/Lymphatic: Aortic atherosclerosis. No enlarged abdominal or pelvic lymph nodes. Reproductive: Uterus  and bilateral adnexa are unremarkable. Other: Trace free fluid within the pelvis, similar to prior. No abdominopelvic fluid collection. No pneumoperitoneum. No abdominal wall hernia. Musculoskeletal: No new or acute bony findings. IMPRESSION: 1. No acute abdominopelvic findings. Specifically, no evidence of bowel obstruction. 2. Moderate volume stool throughout the colon with fecalization of distal small bowel content suggestive of slow transit/constipation. 3. Trace free fluid within the pelvis, similar to prior study. 4. Aortic atherosclerosis (ICD10-I70.0). Electronically Signed   By:  Nicholas  Plundo D.O.   On: 09/14/2022 11:17   DG Abdomen Acute W/Chest  Result Date: 09/14/2022 CLINICAL DATA:  Lower abdominal pain. Evaluate for bowel obstruction. EXAM: DG ABDOMEN ACUTE WITH 1 VIEW CHEST COMPARISON:  Chest PA Lat 09/06/2022 and 07/10/2022, chest CT with IV contrast 09/06/2022. FINDINGS: Chest: The lungs are mildly emphysematous but clear. No pleural effusion is seen. There is a stable mediastinum with calcification in the transverse aorta and mild descending tortuosity. The cardiac size is normal. There is mild osteopenia and slight thoracic levoscoliosis. Mild degenerative changes. Abdomen: There is mild fecal stasis. No dilated small bowel is seen. No evidence of free intraperitoneal air. There are cholecystectomy clips. There are multiple pelvic phleboliths. No calcification is seen at the level of the kidneys. There is moderate aortoiliac calcific plaque. Visceral shadows are stable. No other significant radiographic abnormality is seen. Degenerative change and slight levoscoliosis lumbar spine. IMPRESSION: 1. No evidence of acute chest disease.  COPD. 2. Mild fecal stasis. No evidence of bowel obstruction. 3. Aortic atherosclerosis. 4. Cholecystectomy clips. Electronically Signed   By: Telford Nab M.D.   On: 09/14/2022 05:50    Procedures Procedures    Medications Ordered in ED Medications   potassium chloride SA (KLOR-CON M) CR tablet 40 mEq (40 mEq Oral Given 09/14/22 0907)  fentaNYL (SUBLIMAZE) injection 25 mcg (25 mcg Intravenous Given 09/14/22 0908)  iohexol (OMNIPAQUE) 350 MG/ML injection 75 mL (75 mLs Intravenous Contrast Given 09/14/22 1105)    ED Course/ Medical Decision Making/ A&P                          Medical Decision Making Amount and/or Complexity of Data Reviewed Labs: ordered. Radiology: ordered.  Risk Prescription drug management.   72 year old female presents to ED for evaluation of multiple complaints.  Please see HPI for further details.  On examination patient afebrile and nontachycardic.  Patient was sounds clear bilaterally, she is not hypoxic on room air.  Patient abdomen is soft and compressible throughout.  Patient overall nontoxic in appearance.  Patient CBC with no leukocytosis, hemoglobin at baseline.  Patient CMP shows decreased potassium which was repleted with 40 mEq of oral potassium.  Patient urinalysis unremarkable.  Patient troponin 5 and 5 respectively.  Patient lipase unremarkable.  Patient DG abdomen acute with chest shows mild fecal stasis with no evidence of bowel obstruction.  CT abdomen pelvis shows moderate stool throughout colon representing constipation.  CT scan shows no evidence of bowel obstruction, there is no transition point.  At this time, patient will be advised to return home and take bowel regimen that initially provided success.  The patient was advised to follow-up with GI for further management of her chronic constipation.  Patient given return precautions and she voiced understanding.  Patient had all her questions answered to her satisfaction.  Patient stable for discharge.  Patient case discussed with attending Dr. Sherry Ruffing who voices agreement with plan of management.  Final Clinical Impression(s) / ED Diagnoses Final diagnoses:  Slow transit constipation    Rx / DC Orders ED Discharge Orders     None          Azucena Cecil, PA-C 09/14/22 1150    Tegeler, Gwenyth Allegra, MD 09/14/22 [phone]

## 2022-09-14 NOTE — Telephone Encounter (Signed)
Returned call to patient. Left patient a detailed vm letting her know that Estill Bamberg recommended that she complete a Miralax bowel purge to clean out her colon and then she was advised to start [place] daily. Pt needs to follow provided recommendations and if she has additional concerns after that she should call back.   Detailed recommendations in last office note.

## 2022-09-14 NOTE — ED Notes (Signed)
Patient returned from [place].

## 2022-09-14 NOTE — ED Triage Notes (Signed)
Pt via POV C/O aching centralized chest pain radiating up left neck and left shoulder. Pain associated with dizziness, weakness, headaches, NV, and constipation. Last [facility] Saturday 1/20. No SOB.

## 2022-09-14 NOTE — ED Notes (Signed)
Patient transported to CT 

## 2022-09-14 NOTE — Telephone Encounter (Signed)
Patient is currently admitted in ED. She still cannot have a BM. The doctor is saying she needs to have her motegrity dosage increased. She is asking to please leave a detailed message on what she should do. Please advise.

## 2022-09-14 NOTE — Discharge Instructions (Signed)
Return to the ED with any new or worsening signs or symptoms Please take bowel regimen that was initially provided to you at GI doctor. Please read attached guides concerning chronic constipation Please follow-up with the GI team

## 2022-09-14 NOTE — ED Provider Triage Note (Signed)
Emergency Medicine Provider Triage Evaluation Note  [name] , a 72 y.o. female  was evaluated in triage.  Pt complains of multiple complaints, chest pain, short of breath, abdominal pain, all started 2 days ago, states that she started on medication for constipation and since then she has been having right lower quadrant pain, difficulty with bowel movements, shortness of breath and chest pain.  She denies any urinary symptoms..  Review of Systems  Positive: Chest pain, abdominal pain Negative: Nausea vomiting  Physical Exam  BP (!) 141/95 (BP Location: Right Arm)   Pulse 78   Temp 98.1 F (36.7 C) (Oral)   Resp 18   Ht '5\' 7"'$  (1.702 m)   Wt 62.6 kg   SpO2 96%   BMI 21.61 kg/m  Gen:   Awake, no distress   Resp:  Normal effort  MSK:   Moves extremities without difficulty  Other:    Medical Decision Making  Medically screening exam initiated at 3:33 AM.  Appropriate orders placed.  [name] was informed that the remainder of the evaluation will be completed by another provider, this initial triage assessment does not replace that evaluation, and the importance of remaining in the ED until their evaluation is complete.  Lab and  imaging ordered will need further workup.   Marcello Fennel, PA-C 09/14/22 [phone]

## 2022-09-16 ENCOUNTER — Telehealth: Payer: Self-pay | Admitting: Physician Assistant

## 2022-09-16 ENCOUNTER — Ambulatory Visit (INDEPENDENT_AMBULATORY_CARE_PROVIDER_SITE_OTHER): Payer: 59 | Admitting: Internal Medicine

## 2022-09-16 VITALS — BP 132/72 | HR 81 | Temp 98.5°F | Wt 132.0 lb

## 2022-09-16 DIAGNOSIS — R1032 Left lower quadrant pain: Secondary | ICD-10-CM

## 2022-09-16 DIAGNOSIS — I10 Essential (primary) hypertension: Secondary | ICD-10-CM

## 2022-09-16 DIAGNOSIS — R519 Headache, unspecified: Secondary | ICD-10-CM

## 2022-09-16 DIAGNOSIS — G4441 Drug-induced headache, not elsewhere classified, intractable: Secondary | ICD-10-CM

## 2022-09-16 DIAGNOSIS — J4520 Mild intermittent asthma, uncomplicated: Secondary | ICD-10-CM | POA: Diagnosis not present

## 2022-09-16 DIAGNOSIS — K5901 Slow transit constipation: Secondary | ICD-10-CM

## 2022-09-16 DIAGNOSIS — R1031 Right lower quadrant pain: Secondary | ICD-10-CM

## 2022-09-16 DIAGNOSIS — G8929 Other chronic pain: Secondary | ICD-10-CM | POA: Diagnosis not present

## 2022-09-16 DIAGNOSIS — Z8679 Personal history of other diseases of the circulatory system: Secondary | ICD-10-CM

## 2022-09-16 DIAGNOSIS — Z87891 Personal history of nicotine dependence: Secondary | ICD-10-CM

## 2022-09-16 DIAGNOSIS — M810 Age-related osteoporosis without current pathological fracture: Secondary | ICD-10-CM | POA: Diagnosis not present

## 2022-09-16 DIAGNOSIS — E7841 Elevated Lipoprotein(a): Secondary | ICD-10-CM

## 2022-09-16 DIAGNOSIS — K5909 Other constipation: Secondary | ICD-10-CM

## 2022-09-16 DIAGNOSIS — K219 Gastro-esophageal reflux disease without esophagitis: Secondary | ICD-10-CM | POA: Diagnosis not present

## 2022-09-16 DIAGNOSIS — T50905A Adverse effect of unspecified drugs, medicaments and biological substances, initial encounter: Secondary | ICD-10-CM

## 2022-09-16 MED ORDER — METOPROLOL SUCCINATE ER 50 MG PO TB24: 50.0000 mg | ORAL_TABLET | Freq: Every day | ORAL | 0 refills | Status: DC

## 2022-09-16 MED ORDER — KETOROLAC TROMETHAMINE 30 MG/ML IJ SOLN
30.0000 mg | Freq: Once | INTRAMUSCULAR | Status: AC
  Administered 2022-09-16: 30 mg via INTRAMUSCULAR

## 2022-09-16 MED ORDER — LINACLOTIDE 72 MCG PO CAPS: 72.0000 ug | ORAL_CAPSULE | Freq: Every day | ORAL | 0 refills | Status: DC

## 2022-09-16 MED ORDER — OMEPRAZOLE 40 MG PO CPDR: 40.0000 mg | DELAYED_RELEASE_CAPSULE | Freq: Two times a day (BID) | ORAL | 3 refills | Status: DC

## 2022-09-16 MED ORDER — SENNOSIDES-DOCUSATE SODIUM 8.6-50 MG PO TABS: 1.0000 | ORAL_TABLET | Freq: Two times a day (BID) | ORAL | 3 refills | Status: DC

## 2022-09-16 MED ORDER — FLUTICASONE PROPIONATE HFA 110 MCG/ACT IN AERO: 1.0000 | INHALATION_SPRAY | Freq: Every day | RESPIRATORY_TRACT | 12 refills | Status: DC

## 2022-09-16 NOTE — Assessment & Plan Note (Signed)
Problem has recently worsened, for which she's been seen in ED and in GI office. New mobility medication motegrity has caused severe headaches and bowels havent responded.  Will stop this. Plan: 1)  Miralax clean out today 2)  Tomorrow, begin Linzess (she's done well on this in the past) 3)  Tomorrow, stop constipating diltiazem and replace with metoprolol (this was originally prescribed for AFRVR, but she had a successful ablation years ago) 4)  Tomorrow, she will obtain senna-s for prn use. I spoke to her for 45 minutes this evening to review the plan, after going through her medications one by one.  I will call her early next week.  She will see me in one week for symptom, BP, and HR f/u, at which time we'll adjust doses as needed. She is pleased with plan.

## 2022-09-16 NOTE — Telephone Encounter (Addendum)
Patient called in stating GI Provider started her on Motegrity and she thinks it is causing RLQ pain that radiates to chest, and "real bad H/A." Has not had BM since 09/11/22. Patient seen in ED on 1/23 and 1/15 for similar complaints. Appt given this AM with PCP.

## 2022-09-16 NOTE — Assessment & Plan Note (Signed)
Well controlled at 132/72 though I am stopping her diltiazem and replacing with metoprolol 50 mg daily, recheck BP next week.  Continue osartan 50.  She continues to need periodic potassium repletion (hypokalemia can impeded bowel motility) - I'll recheck next week.

## 2022-09-16 NOTE — Patient Instructions (Signed)
[name],  I'm so sorry that you're having so much trouble.  Today you received a Toradol shot for your headache.   Please don't take any more motegrity.  Please repeat the Miralax "clean out" process that you did recently.    In the meantime I want to talk to you this afternoon about which medicines you're taking.  We will clean up your medication list, and you can tell me if you have any medicine in the house that isn't on your list.  Once we do this,  I'll make a decision about how to manage your constipation.  This may involve changing some of your daily medicines too.  We'll get together next Friday morning to continue our conversation and plan.  Please call me before you make a decision to go to the ER or to the GI doctor.  There may be things we can do to prevent those visits.  Talk to you soon,  Dr. Jimmye Norman

## 2022-09-16 NOTE — Telephone Encounter (Signed)
Patient called, stated everytime she takes her dose of Motegrity, her head starts hurting, pain radiates to her chest. Patient states pain is unbearable, is requesting to speak to a nurse about. Questions if she should go to the ED. Please advise.

## 2022-09-16 NOTE — Assessment & Plan Note (Signed)
Significant pain in all quadrants today,  tympanic.  Palpable stool inferolateral to umbilicus.  Nurse at GI office was concerned she could have appendicitis.  Abd CT completed days ago, clinical presentation not consistent with such, she is reassured and feels better after plan discussed (under constipation).  She is not using her arthritis opioid at all.

## 2022-09-16 NOTE — Telephone Encounter (Signed)
Pt seeing PCP this am.

## 2022-09-16 NOTE — Assessment & Plan Note (Signed)
NSR at each visit, no palpitations, not taking prn diltiazem (no need).  See entry under HTN - changing medication to minimize constipation.

## 2022-09-16 NOTE — Progress Notes (Signed)
Suffering from severe slow-transit constipation, for which she's sought care in ED and with her GI doctors several times in recent past.  Most recent medication Motegrity caused her to have severe headache (new since starting).  She is not taking her opioids so as not to exacerbate the constipation.  No BM since she did the miralax clean out prescribed by GI.  She didn't bring medications today and is not sure what she is taking as we went throught her list together (we completed med review by phone after she went home and collected all of her bottles).  Pain is mostly lower quadrants and midline.  Mild nausea, no vomiting.  Passing flatus.  Able to eat.  Pain and discomfort is bring her down to the point where she sometimes feels like life isn't worth living.  BP 132/72 (BP Location: Right Arm, Patient Position: Sitting, Cuff Size: Small)   Pulse 81   Temp 98.5 F (36.9 C) (Oral)   Wt 132 lb (59.9 kg)   SpO2 100%   BMI 20.67 kg/m  Exam:  Abd flat, tympanic in all quadrants.  Very sensitive to light palpation.  Firm stool palpated just inferior and lateral to navel.  She has voluntaring guarding, resolves when distracted.  No flank pain.  No dysuria.

## 2022-09-16 NOTE — Assessment & Plan Note (Signed)
Severe throbbing, onset after beginning motegrity, an unfortunate side effect.  Headache resolved following toradol injection.  Motegrity discontinued.

## 2022-09-17 ENCOUNTER — Telehealth: Payer: Self-pay

## 2022-09-17 NOTE — Telephone Encounter (Signed)
Requesting to speak with Dr. Jimmye Norman. Please call back.

## 2022-09-17 NOTE — Telephone Encounter (Signed)
Returned call to [name] at 1:15pm.  She is doing very well and sounded great!  Miralax clean out was successful at about 2 am with large volume evacuation, small amounts of rectal bleeding associated, completely resolved.  Abdominal pain is now resolved.  No further headaches upon stopping her Motegrity.  Began metoprolol this morning (replaces diltiazem) Began Linzess this morning. Will begin Senokot - S 1 tab qhs this evening.  If no BM x 2 days, will increase to 1 po bid and titrate upward to results as needed. I will call her on Monday. She will see me for office visit on Friday 2/2.

## 2022-09-17 NOTE — Telephone Encounter (Signed)
Pt is requesting a call back .Marland Kitchen She stated that at her OV Dr Jimmye Norman told her to call back if after taking the meds  for her constipation   if anything happens she is reporting bleeding off and on  when using the restroom

## 2022-09-20 NOTE — Telephone Encounter (Signed)
Planned phonecall f/u with [name], who today reports that she feels terrific and has had comfortable daily Bms since 09/18/22.  Current regimen includes Linzess 145 mg (from an old supply, which she hadn't taken in over a year) - about 12 tabs left; senna-docusate 1 tab qhs, and upon changing from diltiazem to metoprolol.  Formed stool, no cramping, no bleeding.  No further abdominal pain and no further headache.  SBP at home in 130s on new metoprolol, tolerated well, no palpitations or racing.  Plan:  She will begin Linzess 72 mg daily when supply of 145 mg is complete (about 12 days), and continue the senna-docusate one nightly.  Hoping that stopping the CCB will reduce dosage requirement of the Linzess, but if 72 mg is inadequate, will have her take 2 daily.  Senna-docusate can be titrated if needed (she requires explicit directions and much repetition when changes are made - easily confused).  Will cancel our f/u appts as she is doing so well and I'll see her in March.  She will leave me a mssg with concerns so that I can help divert her from ED.

## 2022-09-21 ENCOUNTER — Telehealth: Payer: Self-pay

## 2022-09-21 NOTE — Telephone Encounter (Signed)
RTC from patient wanted Dr. Jimmye Norman to know that her B/P has gone done.  B/P now is 143/65.  Message to be sent to Dr. Jimmye Norman.

## 2022-09-21 NOTE — Telephone Encounter (Signed)
        Patient  visited [facility] on 1/23  Telephone encounter attempt :    A HIPAA compliant voice message was left requesting a return call.  Instructed patient to call back   [place] [phone] [address], [place], [place] [address] Phone: [phone] Email: [email or web address]

## 2022-09-21 NOTE — Telephone Encounter (Signed)
Pt is requesting a call back .Marland Kitchen She stated that she spoke with her PCP yesterday who told her to keep in touch with her if any changes in her bp and not go to the ER  she stated that at 8 am it was 141/81 and now it is 162/90 .Marland KitchenMarland Kitchen

## 2022-09-22 ENCOUNTER — Telehealth: Payer: Self-pay | Admitting: Internal Medicine

## 2022-09-22 ENCOUNTER — Other Ambulatory Visit: Payer: Self-pay

## 2022-09-22 ENCOUNTER — Other Ambulatory Visit: Payer: Self-pay | Admitting: Internal Medicine

## 2022-09-22 ENCOUNTER — Telehealth: Payer: Self-pay

## 2022-09-22 ENCOUNTER — Emergency Department (HOSPITAL_COMMUNITY)
Admission: EM | Admit: 2022-09-22 | Discharge: 2022-09-22 | Disposition: A | Discharge status: Home / Self Care | Payer: 59 | Attending: Emergency Medicine | Admitting: Emergency Medicine

## 2022-09-22 ENCOUNTER — Emergency Department (HOSPITAL_COMMUNITY): Payer: 59

## 2022-09-22 DIAGNOSIS — I10 Essential (primary) hypertension: Secondary | ICD-10-CM | POA: Insufficient documentation

## 2022-09-22 DIAGNOSIS — Z7901 Long term (current) use of anticoagulants: Secondary | ICD-10-CM | POA: Insufficient documentation

## 2022-09-22 DIAGNOSIS — R531 Weakness: Secondary | ICD-10-CM | POA: Insufficient documentation

## 2022-09-22 DIAGNOSIS — Z79899 Other long term (current) drug therapy: Secondary | ICD-10-CM | POA: Insufficient documentation

## 2022-09-22 DIAGNOSIS — K5900 Constipation, unspecified: Secondary | ICD-10-CM | POA: Diagnosis not present

## 2022-09-22 DIAGNOSIS — Z743 Need for continuous supervision: Secondary | ICD-10-CM | POA: Diagnosis not present

## 2022-09-22 DIAGNOSIS — R404 Transient alteration of awareness: Secondary | ICD-10-CM | POA: Diagnosis not present

## 2022-09-22 DIAGNOSIS — R6889 Other general symptoms and signs: Secondary | ICD-10-CM | POA: Diagnosis not present

## 2022-09-22 DIAGNOSIS — R519 Headache, unspecified: Secondary | ICD-10-CM | POA: Diagnosis not present

## 2022-09-22 LAB — CBC WITH DIFFERENTIAL/PLATELET
Abs Immature Granulocytes: 0.01 10*3/uL (ref 0.00–0.07)
Basophils Absolute: 0 10*3/uL (ref 0.0–0.1)
Basophils Relative: 1 %
Eosinophils Absolute: 0.1 10*3/uL (ref 0.0–0.5)
Eosinophils Relative: 3 %
HCT: 35.6 % — ABNORMAL LOW (ref 36.0–46.0)
Hemoglobin: 11.8 g/dL — ABNORMAL LOW (ref 12.0–15.0)
Immature Granulocytes: 0 %
Lymphocytes Relative: 29 %
Lymphs Abs: 1.1 10*3/uL (ref 0.7–4.0)
MCH: 27.1 pg (ref 26.0–34.0)
MCHC: 33.1 g/dL (ref 30.0–36.0)
MCV: 81.8 fL (ref 80.0–100.0)
Monocytes Absolute: 0.4 10*3/uL (ref 0.1–1.0)
Monocytes Relative: 12 %
Neutro Abs: 2.1 10*3/uL (ref 1.7–7.7)
Neutrophils Relative %: 55 %
Platelets: 213 10*3/uL (ref 150–400)
RBC: 4.35 MIL/uL (ref 3.87–5.11)
RDW: 17 % — ABNORMAL HIGH (ref 11.5–15.5)
WBC: 3.8 10*3/uL — ABNORMAL LOW (ref 4.0–10.5)
nRBC: 0 % (ref 0.0–0.2)

## 2022-09-22 LAB — BASIC METABOLIC PANEL
Anion gap: 8 (ref 5–15)
BUN: 9 mg/dL (ref 8–23)
CO2: 24 mmol/L (ref 22–32)
Calcium: 9.3 mg/dL (ref 8.9–10.3)
Chloride: 108 mmol/L (ref 98–111)
Creatinine, Ser: 0.66 mg/dL (ref 0.44–1.00)
GFR, Estimated: >60 mL/min (ref >60)
Glucose, Bld: 89 mg/dL (ref 70–99)
Potassium: 3.1 mmol/L — ABNORMAL LOW (ref 3.5–5.1)
Sodium: 140 mmol/L (ref 135–145)

## 2022-09-22 LAB — TROPONIN I (HIGH SENSITIVITY): Troponin I (High Sensitivity): 6 ng/L (ref <18)

## 2022-09-22 MED ORDER — LABETALOL HCL 5 MG/ML IV SOLN
10.0000 mg | Freq: Once | INTRAVENOUS | Status: AC
  Administered 2022-09-22: 10 mg via INTRAVENOUS
  Filled 2022-09-22: qty 4

## 2022-09-22 MED ORDER — SPIRONOLACTONE 25 MG PO TABS: 25.0000 mg | ORAL_TABLET | Freq: Every day | ORAL | 11 refills | Status: DC

## 2022-09-22 MED ORDER — ACETAMINOPHEN 500 MG PO TABS
1000.0000 mg | ORAL_TABLET | Freq: Once | ORAL | Status: AC
  Administered 2022-09-22: 1000 mg via ORAL
  Filled 2022-09-22: qty 2

## 2022-09-22 NOTE — ED Notes (Signed)
Patient Alert and oriented to baseline. Stable and ambulatory to baseline. Patient verbalized understanding of the discharge instructions.  Patient belongings were taken by the patient.   

## 2022-09-22 NOTE — Telephone Encounter (Signed)
Requesting to speak with Dr. Williams. Please call pt back.  

## 2022-09-22 NOTE — ED Triage Notes (Signed)
BIB EMS from home cc dizziness, nausea and weakness, recently changed cardiac meds. Pt noticed BP trending up.   132CBG, 164/84, 99%RA, 96hr

## 2022-09-22 NOTE — ED Notes (Signed)
Patient ambulated to restroom, steady gait.

## 2022-09-22 NOTE — Telephone Encounter (Signed)
Called [name] regarding plan for blood pressure management following her ED visit this morning.  Heart rate will not tolerate increasing her metoprolol.  Will add spironolactone 25 mg daily-she has been on this in the past.  Given her chronic hypokalemia which typically requires supplementation even while taking her ARB, I will monitor her potassium regularly but am not concerned about hyperkalemia at this time.  Follow-up with me on 09/30/2021.  I suspect I will be talking to her on the phone before then, as she needs frequent reassurance.

## 2022-09-22 NOTE — ED Provider Notes (Signed)
[facility] Provider Note   CSN: [id number] Arrival date & time: 09/22/22  0436     History  Chief Complaint  Patient presents with   Dizziness   Weakness    [name] is a 72 y.o. female.  Patient presents to the emergency department for evaluation of dizziness.  Patient reports that she has been having headaches on and off for the last couple of days.  Starting yesterday morning, she took her blood pressure multiple times through the day and it remained high.  Patient reports that last week she was changed from diltiazem to Toprol for her blood pressure.  The diltiazem was stopped because it was speculated that it might be adding to her chronic constipation issues.  Patient reports that she woke up at 3 AM to go to the bathroom and felt very off balance.  She was having trouble walking because she was very dizzy.       Home Medications Prior to Admission medications   Medication Sig Start Date End Date Taking? Authorizing Provider  alendronate (FOSAMAX) 70 MG tablet Take 1 tablet (70 mg total) by mouth every 7 (seven) days. Take with a full glass of water on an empty stomach. 06/23/22 06/23/23  Angelica Pou, MD  atorvastatin (LIPITOR) 40 MG tablet Take 1 tablet (40 mg total) by mouth daily. 08/27/21 09/16/22  Angelica Pou, MD  cetirizine (ZYRTEC) 10 MG tablet Take 10 mg by mouth daily.    [provider]  docusate sodium (COLACE) 100 MG capsule Take 1 capsule (100 mg total) by mouth daily as needed for up to 180 doses for mild constipation. 06/09/22   Angelica Pou, MD  ezetimibe (ZETIA) 10 MG tablet Take 1 tablet (10 mg total) by mouth daily. 12/07/21 12/07/22  Angelica Pou, MD  fluticasone (FLOVENT HFA) 110 MCG/ACT inhaler Inhale 1 puff into the lungs daily. Inhale 1 puff into the lungs daily. 09/16/22   Angelica Pou, MD  HYDROcodone-acetaminophen (NORCO/VICODIN) 5-325 MG tablet 1/2 to 1  tablet only when needed for severe pain, as often as every 6 hours. Patient not taking: Reported on 09/17/2022 08/31/22   Angelica Pou, MD  levalbuterol [facility] HFA) 45 MCG/ACT inhaler Inhale 1 puff into the lungs every 6 (six) hours as needed for shortness of breath. 03/04/22   Angelica Pou, MD  linaclotide Rolan Lipa) 72 MCG capsule Take 1 capsule (72 mcg total) by mouth daily before breakfast. 09/16/22   Angelica Pou, MD  losartan (COZAAR) 50 MG tablet Take 2 tablets (100 mg total) by mouth daily. 06/09/22 06/09/23  Angelica Pou, MD  metoprolol succinate (TOPROL-XL) 50 MG 24 hr tablet Take 1 tablet (50 mg total) by mouth daily. Take with or immediately following a meal. 09/16/22   Angelica Pou, MD  montelukast (SINGULAIR) 10 MG tablet Take 1 tablet (10 mg total) by mouth at bedtime. 03/04/22   Angelica Pou, MD  omeprazole (PRILOSEC) 40 MG capsule Take 1 capsule (40 mg total) by mouth in the morning and at bedtime. 09/16/22   Angelica Pou, MD  polyethylene glycol (MIRALAX / GLYCOLAX) 17 g packet Take 17 g by mouth 2 (two) times daily. 09/29/21   Timothy Lasso, MD  potassium chloride (KLOR-CON M) 10 MEQ tablet Take 1 tablet (10 mEq total) by mouth daily. 08/12/22   Angelica Pou, MD  rivaroxaban (XARELTO) 20 MG TABS tablet Take 1 tablet (20 mg  total) by mouth daily with supper. 01/12/22   Sherran Needs, NP  senna-docusate (SENNA-S) 8.6-50 MG tablet Take 1 tablet by mouth 2 (two) times daily. Or as directed by physician. 09/16/22   Angelica Pou, MD  tiotropium (SPIRIVA) 18 MCG inhalation capsule Place 1 capsule (18 mcg total) into inhaler and inhale daily. 10/16/21   Angelica Pou, MD      Allergies    Albuterol, Percocet [oxycodone-acetaminophen], Celecoxib, and Tramadol    Review of Systems   Review of Systems  Physical Exam Updated Vital Signs BP (!) 140/78   Pulse 65   Temp 97.9 F (36.6 C) (Oral)   Resp 17   Ht 5'  7" (1.702 m)   Wt 59.9 kg   SpO2 100%   BMI 20.68 kg/m  Physical Exam Vitals and nursing note reviewed.  Constitutional:      General: She is not in acute distress.    Appearance: She is well-developed.  HENT:     Head: Normocephalic and atraumatic.     Mouth/Throat:     Mouth: Mucous membranes are moist.  Eyes:     General: Vision grossly intact. Gaze aligned appropriately.     Extraocular Movements: Extraocular movements intact.     Conjunctiva/sclera: Conjunctivae normal.  Cardiovascular:     Rate and Rhythm: Normal rate and regular rhythm.     Pulses: Normal pulses.     Heart sounds: Normal heart sounds, S1 normal and S2 normal. No murmur heard.    No friction rub. No gallop.  Pulmonary:     Effort: Pulmonary effort is normal. No respiratory distress.     Breath sounds: Normal breath sounds.  Abdominal:     General: Bowel sounds are normal.     Palpations: Abdomen is soft.     Tenderness: There is no abdominal tenderness. There is no guarding or rebound.     Hernia: No hernia is present.  Musculoskeletal:        General: No swelling.     Cervical back: Full passive range of motion without pain, normal range of motion and neck supple. No spinous process tenderness or muscular tenderness. Normal range of motion.     Right lower leg: No edema.     Left lower leg: No edema.  Skin:    General: Skin is warm and dry.     Capillary Refill: Capillary refill takes less than 2 seconds.     Findings: No ecchymosis, erythema, rash or wound.  Neurological:     General: No focal deficit present.     Mental Status: She is alert and oriented to person, place, and time.     GCS: GCS eye subscore is 4. GCS verbal subscore is 5. GCS motor subscore is 6.     Cranial Nerves: Cranial nerves 2-12 are intact.     Sensory: Sensation is intact.     Motor: Motor function is intact.     Coordination: Coordination is intact.  Psychiatric:        Attention and Perception: Attention normal.         Mood and Affect: Mood normal.        Speech: Speech normal.        Behavior: Behavior normal.     ED Results / Procedures / Treatments   Labs (all labs ordered are listed, but only abnormal results are displayed) Labs Reviewed  CBC WITH DIFFERENTIAL/PLATELET - Abnormal; Notable for the following components:  Result Value   WBC 3.8 (*)    Hemoglobin 11.8 (*)    HCT 35.6 (*)    RDW 17.0 (*)    All other components within normal limits  BASIC METABOLIC PANEL - Abnormal; Notable for the following components:   Potassium 3.1 (*)    All other components within normal limits  TROPONIN I (HIGH SENSITIVITY)    EKG None  Radiology CT HEAD WO CONTRAST (5MM)  Result Date: 09/22/2022 CLINICAL DATA:  Headache, new onset EXAM: CT HEAD WITHOUT CONTRAST TECHNIQUE: Contiguous axial images were obtained from the base of the skull through the vertex without intravenous contrast. RADIATION DOSE REDUCTION: This exam was performed according to the departmental dose-optimization program which includes automated exposure control, adjustment of the mA and/or kV according to patient size and/or use of iterative reconstruction technique. COMPARISON:  05/09/2022 FINDINGS: Brain: No evidence of acute infarction, hemorrhage, hydrocephalus, extra-axial collection or mass lesion/mass effect. Vascular: No hyperdense vessel or unexpected calcification. Skull: Normal. Negative for fracture or focal lesion. Sinuses/Orbits: No acute finding. IMPRESSION: No acute finding or explanation for headache. Electronically Signed   By: Jorje Guild M.D.   On: 09/22/2022 05:47    Procedures Procedures    Medications Ordered in ED Medications  labetalol (NORMODYNE) injection 10 mg (10 mg Intravenous Given 09/22/22 0522)  acetaminophen (TYLENOL) tablet 1,000 mg (1,000 mg Oral Given 09/22/22 7341)    ED Course/ Medical Decision Making/ A&P                             Medical Decision Making Amount and/or  Complexity of Data Reviewed Labs: ordered. Radiology: ordered.  Risk OTC drugs. Prescription drug management.   Patient presents to the emergency department for evaluation of acute onset of dizziness.  Patient woke up this morning and had difficulty walking.  Her blood pressure has been elevated throughout the day yesterday as well.  She does have a history of hypertension.  She was previously on Cardizem because of a history of atrial fibrillation and Cozaar.  Her Cozaar dose was increased to 100 mg.  Cardizem was switched to atenolol because she was having problems with constipation.  After switching the Cardizem to atenolol, blood pressure has been elevated.  Workup has been unremarkable.  Blood pressure improved here with treatment.  CT head unremarkable.  Blood work is normal.  Neurologic exam is normal, no focal deficits.  Patient feeling better.        Final Clinical Impression(s) / ED Diagnoses Final diagnoses:  Weakness  Primary hypertension    Rx / DC Orders ED Discharge Orders     None         Jazzalyn Loewenstein, Gwenyth Allegra, MD 09/22/22 [phone]

## 2022-09-22 NOTE — ED Notes (Signed)
Pt ambulatory to the restroom independently. Gait steady and even.

## 2022-09-24 ENCOUNTER — Telehealth: Payer: Self-pay | Admitting: Internal Medicine

## 2022-09-24 ENCOUNTER — Encounter: Payer: 59 | Admitting: Internal Medicine

## 2022-09-24 ENCOUNTER — Telehealth: Payer: Self-pay

## 2022-09-24 DIAGNOSIS — I10 Essential (primary) hypertension: Secondary | ICD-10-CM

## 2022-09-24 MED ORDER — METOPROLOL SUCCINATE ER 50 MG PO TB24: 50.0000 mg | ORAL_TABLET | Freq: Every day | ORAL | 0 refills | Status: DC

## 2022-09-24 NOTE — Telephone Encounter (Signed)
Doesn't think she needs the senna-S, the linzess is working just fine.  Daily Bms. Taking her previous dose of 145 mg; just received the 73 mg dose and hasn't  yet started.   Developed a bad headache after starting Senna-S (she calls it "my stomach pill to make me use the BR" ).  Headache is getting better after stopping it as of yesterday morning, taking no dose last night or this morning.  She has started the spironolactone 25 mg daily to help with uncontrolled BP upon change from CCB to metoprolol.

## 2022-09-24 NOTE — Telephone Encounter (Signed)
Requesting to speak with Dr. Williams. Please call pt back.  

## 2022-09-27 ENCOUNTER — Telehealth: Payer: Self-pay | Admitting: *Deleted

## 2022-09-27 ENCOUNTER — Emergency Department (HOSPITAL_COMMUNITY)
Admission: EM | Admit: 2022-09-27 | Discharge: 2022-09-27 | Disposition: A | Discharge status: Home / Self Care | Payer: 59 | Attending: Student | Admitting: Student

## 2022-09-27 ENCOUNTER — Emergency Department (HOSPITAL_COMMUNITY): Payer: 59

## 2022-09-27 DIAGNOSIS — K5900 Constipation, unspecified: Secondary | ICD-10-CM | POA: Diagnosis not present

## 2022-09-27 DIAGNOSIS — Z87891 Personal history of nicotine dependence: Secondary | ICD-10-CM | POA: Insufficient documentation

## 2022-09-27 DIAGNOSIS — Z7901 Long term (current) use of anticoagulants: Secondary | ICD-10-CM | POA: Insufficient documentation

## 2022-09-27 DIAGNOSIS — I10 Essential (primary) hypertension: Secondary | ICD-10-CM | POA: Diagnosis not present

## 2022-09-27 DIAGNOSIS — Z8616 Personal history of COVID-19: Secondary | ICD-10-CM | POA: Insufficient documentation

## 2022-09-27 DIAGNOSIS — Z79899 Other long term (current) drug therapy: Secondary | ICD-10-CM | POA: Diagnosis not present

## 2022-09-27 DIAGNOSIS — R404 Transient alteration of awareness: Secondary | ICD-10-CM | POA: Diagnosis not present

## 2022-09-27 DIAGNOSIS — Z743 Need for continuous supervision: Secondary | ICD-10-CM | POA: Diagnosis not present

## 2022-09-27 DIAGNOSIS — R519 Headache, unspecified: Secondary | ICD-10-CM | POA: Diagnosis present

## 2022-09-27 DIAGNOSIS — J45909 Unspecified asthma, uncomplicated: Secondary | ICD-10-CM | POA: Diagnosis not present

## 2022-09-27 DIAGNOSIS — R0689 Other abnormalities of breathing: Secondary | ICD-10-CM | POA: Diagnosis not present

## 2022-09-27 DIAGNOSIS — R079 Chest pain, unspecified: Secondary | ICD-10-CM | POA: Diagnosis not present

## 2022-09-27 DIAGNOSIS — R6889 Other general symptoms and signs: Secondary | ICD-10-CM | POA: Diagnosis not present

## 2022-09-27 DIAGNOSIS — I160 Hypertensive urgency: Secondary | ICD-10-CM | POA: Diagnosis not present

## 2022-09-27 DIAGNOSIS — R0789 Other chest pain: Secondary | ICD-10-CM | POA: Diagnosis not present

## 2022-09-27 LAB — TROPONIN I (HIGH SENSITIVITY)
Troponin I (High Sensitivity): 8 ng/L (ref <18)
Troponin I (High Sensitivity): 9 ng/L (ref <18)

## 2022-09-27 LAB — CBC WITH DIFFERENTIAL/PLATELET
Abs Immature Granulocytes: 0.01 10*3/uL (ref 0.00–0.07)
Basophils Absolute: 0 10*3/uL (ref 0.0–0.1)
Basophils Relative: 1 %
Eosinophils Absolute: 0.1 10*3/uL (ref 0.0–0.5)
Eosinophils Relative: 2 %
HCT: 35.3 % — ABNORMAL LOW (ref 36.0–46.0)
Hemoglobin: 11.7 g/dL — ABNORMAL LOW (ref 12.0–15.0)
Immature Granulocytes: 0 %
Lymphocytes Relative: 40 %
Lymphs Abs: 1.5 10*3/uL (ref 0.7–4.0)
MCH: 27 pg (ref 26.0–34.0)
MCHC: 33.1 g/dL (ref 30.0–36.0)
MCV: 81.5 fL (ref 80.0–100.0)
Monocytes Absolute: 0.4 10*3/uL (ref 0.1–1.0)
Monocytes Relative: 11 %
Neutro Abs: 1.8 10*3/uL (ref 1.7–7.7)
Neutrophils Relative %: 46 %
Platelets: 197 10*3/uL (ref 150–400)
RBC: 4.33 MIL/uL (ref 3.87–5.11)
RDW: 16.5 % — ABNORMAL HIGH (ref 11.5–15.5)
WBC: 3.8 10*3/uL — ABNORMAL LOW (ref 4.0–10.5)
nRBC: 0 % (ref 0.0–0.2)

## 2022-09-27 LAB — COMPREHENSIVE METABOLIC PANEL
ALT: 15 U/L (ref 0–44)
AST: 15 U/L (ref 15–41)
Albumin: 3.7 g/dL (ref 3.5–5.0)
Alkaline Phosphatase: 49 U/L (ref 38–126)
Anion gap: 11 (ref 5–15)
BUN: 8 mg/dL (ref 8–23)
CO2: 23 mmol/L (ref 22–32)
Calcium: 9.4 mg/dL (ref 8.9–10.3)
Chloride: 105 mmol/L (ref 98–111)
Creatinine, Ser: 0.66 mg/dL (ref 0.44–1.00)
GFR, Estimated: >60 mL/min (ref >60)
Glucose, Bld: 93 mg/dL (ref 70–99)
Potassium: 3.3 mmol/L — ABNORMAL LOW (ref 3.5–5.1)
Sodium: 139 mmol/L (ref 135–145)
Total Bilirubin: 0.3 mg/dL (ref 0.3–1.2)
Total Protein: 6.9 g/dL (ref 6.5–8.1)

## 2022-09-27 LAB — BRAIN NATRIURETIC PEPTIDE: B Natriuretic Peptide: 118.3 pg/mL — ABNORMAL HIGH (ref 0.0–100.0)

## 2022-09-27 MED ORDER — PROCHLORPERAZINE EDISYLATE 10 MG/2ML IJ SOLN
10.0000 mg | Freq: Once | INTRAMUSCULAR | Status: AC
  Administered 2022-09-27: 10 mg via INTRAVENOUS
  Filled 2022-09-27: qty 2

## 2022-09-27 MED ORDER — DIPHENHYDRAMINE HCL 50 MG/ML IJ SOLN
25.0000 mg | Freq: Once | INTRAMUSCULAR | Status: AC
  Administered 2022-09-27: 25 mg via INTRAVENOUS
  Filled 2022-09-27: qty 1

## 2022-09-27 MED ORDER — HYDRALAZINE HCL 20 MG/ML IJ SOLN
10.0000 mg | Freq: Once | INTRAMUSCULAR | Status: AC
  Administered 2022-09-27: 10 mg via INTRAVENOUS
  Filled 2022-09-27: qty 1

## 2022-09-27 NOTE — Discharge Instructions (Signed)
For constipation: -Please stop your senna stimulant -Take your Linzess daily -If they constipated, take 8 caps of MiraLAX and Dulcolax and use until you have liquid stools.

## 2022-09-27 NOTE — ED Triage Notes (Signed)
PT BIB [facility] for ongoing CP, dizzyness, HTN worsening x 3 days. Last seen on 09/22/22 for weakness. 170/90 98% HR 80 CBG 112.  EMS endorsed peaked T-waves on rhythm.  EMs gave 324 ASA.  20G L. AC

## 2022-09-27 NOTE — ED Notes (Signed)
Pt provided discharge instructions and prescription information. Pt was given the opportunity to ask questions and questions were answered.   

## 2022-09-27 NOTE — Telephone Encounter (Signed)
Patient called in stating she took BP meds this AM at 0730. Checked BP at 1100 and it was 180/96. Had some dizziness which has since improved after lying down. Denies vision changes, SHOB.. States she's been having CP for awhile now since BPs have been high. She is asked to take BP now. Patient stated she will take it and call back with result.

## 2022-09-27 NOTE — ED Provider Notes (Signed)
[facility] [place] Provider Note  CSN: [id number] Arrival date & time: 09/27/22 1543  Chief Complaint(s) No chief complaint on file.  HPI [name] is a 72 y.o. female with PMH asthma, HTN, H. pylori, splenic laceration after colonoscopy who presents emergency department for evaluation of headache, constipation and elevated blood pressures.  Patient recently seen on 09/22/2022 for similar complaints and ultimately discharged after single dose of labetalol given.  Patient states that they have been tinkering with her blood pressure medications at home, and she feels that whenever she takes her senna stimulant laxative, she feels poorly, her blood pressure goes up and she gets a migraine style headache.  She states that she felt more constipated today so she took this medication and all of the symptoms occurred.  She noticed that her blood pressure was greater than 180 and her internal medicine office told her to come to the emergency department for further evaluation.  Here in the emergency department, patient endorses headache but denies chest pain, shortness of breath, abdominal pain, nausea, vomiting or other systemic symptoms.   Past Medical History Past Medical History:  Diagnosis Date   Asthma in adult, mild intermittent, uncomplicated    Chronic anticoagulation    Chronic constipation    COVID-19 virus infection 10/19/2021   Eczema    GERD (gastroesophageal reflux disease)    H. pylori infection 09/24/2021   Identified on EGD biopsy.  Treatment will be discussed with her GI doctor.  SHe will need a very simple regimen which has no contraindications in combination with her existing medication regimen.     Hemoperitoneum 09/27/2021   Due to splenic lac occurring after colonoscopy on 09/24/21.  Xarelto resumed.    History of kidney stones    Hx of vaginal bleeding, resolved, ultrasound completed 10/11/2018   Hypertension    Laceration of spleen     Presumed complication of colonoscopy 09/24/21.  Pain and hemoperitoneum.  Xarelto resumed.  Short term opioid pain management.     Personal history of atrial fibrillation, s/p ablation, now in NSR but remains on anticoaulation 11/26/2015   Followed in cardiology clinic    Patient Active Problem List   Diagnosis Date Noted   Intractable headache due to drug 09/16/2022   History of colonic polyps    H/O hypokalemia 10/29/2020   Abdominal aortic atherosclerosis ([place]) 10/22/2020   Seasonal and perennial allergic (presumed) rhinitis 07/25/2020   Asthma in adult, mild intermittent, uncomplicated    Chronic recurrent  abdominal pain associated with bowel irregularity and gas 07/24/2020   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 ([place]) 07/23/2020   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 02/14/2020   Primary osteoarthritis involving multiple joints 06/14/2018   Osteoporosis without current pathological fracture 04/15/2017   Chronic constipation, initially idiopathic, exacerbated by chronic opioid use 10/19/2016   Anemia 10/19/2016   Hyperlipidemia 03/18/2016   GERD (gastroesophageal reflux disease)    Personal history of atrial fibrillation, now s/p ablation in NSR 11/26/2015   Marijuana abuse 06/11/2015   Hypertension    Home Medication(s) Prior to Admission medications   Medication Sig Start Date End Date Taking? Authorizing Provider  spironolactone (ALDACTONE) 25 MG tablet Take 1 tablet (25 mg total) by mouth daily. 09/22/22 09/22/23  Angelica Pou, MD  alendronate (FOSAMAX) 70 MG tablet Take 1 tablet (70 mg total) by mouth every 7 (seven) days. Take with a full glass of water on an empty stomach. 06/23/22 06/23/23  Jimmye Norman,  Elaina Pattee, MD  atorvastatin (LIPITOR) 40 MG tablet Take 1 tablet (40 mg total) by mouth daily. 08/27/21 09/16/22  Angelica Pou, MD  cetirizine (ZYRTEC) 10 MG tablet Take 10 mg by mouth daily.    [provider]  docusate sodium (COLACE) 100  MG capsule Take 1 capsule (100 mg total) by mouth daily as needed for up to 180 doses for mild constipation. 06/09/22   Angelica Pou, MD  ezetimibe (ZETIA) 10 MG tablet Take 1 tablet (10 mg total) by mouth daily. 12/07/21 12/07/22  Angelica Pou, MD  fluticasone (FLOVENT HFA) 110 MCG/ACT inhaler Inhale 1 puff into the lungs daily. Inhale 1 puff into the lungs daily. 09/16/22   Angelica Pou, MD  HYDROcodone-acetaminophen (NORCO/VICODIN) 5-325 MG tablet 1/2 to 1 tablet only when needed for severe pain, as often as every 6 hours. Patient not taking: Reported on 09/17/2022 08/31/22   Angelica Pou, MD  levalbuterol [facility] HFA) 45 MCG/ACT inhaler Inhale 1 puff into the lungs every 6 (six) hours as needed for shortness of breath. 03/04/22   Angelica Pou, MD  linaclotide Rolan Lipa) 72 MCG capsule Take 1 capsule (72 mcg total) by mouth daily before breakfast. 09/16/22   Angelica Pou, MD  losartan (COZAAR) 50 MG tablet Take 2 tablets (100 mg total) by mouth daily. 06/09/22 06/09/23  Angelica Pou, MD  metoprolol succinate (TOPROL-XL) 50 MG 24 hr tablet Take 1 tablet (50 mg total) by mouth daily. Take with or immediately following a meal. 09/24/22   Angelica Pou, MD  montelukast (SINGULAIR) 10 MG tablet Take 1 tablet (10 mg total) by mouth at bedtime. 03/04/22   Angelica Pou, MD  omeprazole (PRILOSEC) 40 MG capsule Take 1 capsule (40 mg total) by mouth in the morning and at bedtime. 09/16/22   Angelica Pou, MD  polyethylene glycol (MIRALAX / GLYCOLAX) 17 g packet Take 17 g by mouth 2 (two) times daily. 09/29/21   Timothy Lasso, MD  potassium chloride (KLOR-CON M) 10 MEQ tablet Take 1 tablet (10 mEq total) by mouth daily. 08/12/22   Angelica Pou, MD  rivaroxaban (XARELTO) 20 MG TABS tablet Take 1 tablet (20 mg total) by mouth daily with supper. 01/12/22   Sherran Needs, NP  senna-docusate (SENNA-S) 8.6-50 MG tablet Take 1 tablet by mouth  2 (two) times daily. Or as directed by physician. 09/16/22   Angelica Pou, MD  tiotropium (SPIRIVA) 18 MCG inhalation capsule Place 1 capsule (18 mcg total) into inhaler and inhale daily. 10/16/21   Angelica Pou, MD                                                                                                                                    Past Surgical History Past Surgical History:  Procedure Laterality Date   ATRIAL FIBRILLATION ABLATION N/A 09/30/2017   Procedure: ATRIAL FIBRILLATION ABLATION;  Surgeon: Thompson Grayer, MD;  Location: [facility];  Service: Cardiovascular;  Laterality: N/A;   BIOPSY  09/24/2021   Procedure: BIOPSY;  Surgeon: Sharyn Creamer, MD;  Location: [facility];  Service: Gastroenterology;;   CHOLECYSTECTOMY N/A 12/08/2018   Procedure: LAPAROSCOPIC CHOLECYSTECTOMY WITH INTRAOPERATIVE CHOLANGIOGRAM;  Surgeon: Jovita Kussmaul, MD;  Location: [facility];  Service: General;  Laterality: N/A;   COLONOSCOPY WITH PROPOFOL N/A 09/24/2021   Procedure: COLONOSCOPY WITH PROPOFOL;  Surgeon: Sharyn Creamer, MD;  Location: [facility];  Service: Gastroenterology;  Laterality: N/A;   ERCP N/A 12/09/2018   Procedure: ENDOSCOPIC RETROGRADE CHOLANGIOPANCREATOGRAPHY (ERCP);  Surgeon: Carol Ada, MD;  Location: [facility];  Service: Endoscopy;  Laterality: N/A;   ESOPHAGOGASTRODUODENOSCOPY (EGD) WITH PROPOFOL N/A 09/24/2021   Procedure: ESOPHAGOGASTRODUODENOSCOPY (EGD) WITH PROPOFOL;  Surgeon: Sharyn Creamer, MD;  Location: [facility];  Service: Gastroenterology;  Laterality: N/A;   POLYPECTOMY  09/24/2021   Procedure: POLYPECTOMY;  Surgeon: Sharyn Creamer, MD;  Location: [facility] ENDOSCOPY;  Service: Gastroenterology;;   Joan Mayans  12/09/2018   Procedure: Joan Mayans;  Surgeon: Carol Ada, MD;  Location: [facility] ENDOSCOPY;  Service: Endoscopy;;   TONSILLECTOMY     TUBAL LIGATION     Family History Family History  Problem Relation Age of Onset   Heart attack Mother         Young age   Breast cancer Sister        Late 45s; now s/p mastectomy    Lung cancer Brother        x 2   Cancer Sister    Cancer Sister    Cancer Sister    Colon cancer Neg Hx    Colon polyps Neg Hx    Esophageal cancer Neg Hx    Rectal cancer Neg Hx    Stomach cancer Neg Hx     Social History Social History   Tobacco Use   Smoking status: Former    Types: Cigarettes    Quit date: 10/24/2015    Years since quitting: 6.9   Smokeless tobacco: Never  Vaping Use   Vaping Use: Never used  Substance Use Topics   Alcohol use: Not Currently   Drug use: Not Currently    Types: Marijuana    Comment: Smoked marijuana in the past.   Allergies Albuterol, Percocet [oxycodone-acetaminophen], Celecoxib, and Tramadol  Review of Systems Review of Systems  Gastrointestinal:  Positive for constipation.  Neurological:  Positive for headaches.    Physical Exam Vital Signs  I have reviewed the triage vital signs BP (!) 177/97   Pulse 68   Temp 97.8 F (36.6 C) (Oral)   Resp 15   SpO2 100%   Physical Exam Vitals and nursing note reviewed.  Constitutional:      General: She is not in acute distress.    Appearance: She is well-developed.  HENT:     Head: Normocephalic and atraumatic.  Eyes:     Conjunctiva/sclera: Conjunctivae normal.  Cardiovascular:     Rate and Rhythm: Normal rate and regular rhythm.     Heart sounds: No murmur heard. Pulmonary:     Effort: Pulmonary effort is normal. No respiratory distress.     Breath sounds: Normal breath sounds.  Abdominal:     Palpations: Abdomen is soft.     Tenderness: There is no abdominal tenderness.  Musculoskeletal:        General: No swelling.     Cervical back: Neck supple.  Skin:  General: Skin is warm and dry.     Capillary Refill: Capillary refill takes less than 2 seconds.  Neurological:     Mental Status: She is alert.  Psychiatric:        Mood and Affect: Mood normal.     ED Results and  Treatments Labs (all labs ordered are listed, but only abnormal results are displayed) Labs Reviewed  COMPREHENSIVE METABOLIC PANEL  CBC WITH DIFFERENTIAL/PLATELET  BRAIN NATRIURETIC PEPTIDE  TROPONIN I (HIGH SENSITIVITY)  TROPONIN I (HIGH SENSITIVITY)                                                                                                                          Radiology DG Chest 2 View  Result Date: 09/27/2022 CLINICAL DATA:  Chest pain EXAM: CHEST - 2 VIEW COMPARISON:  09/14/2022 FINDINGS: The heart size and mediastinal contours are stable. Aortic atherosclerosis. Similar hyperinflated lungs. Both lungs are clear. The visualized skeletal structures are unremarkable. IMPRESSION: No active cardiopulmonary disease. Electronically Signed   By: Davina Poke D.O.   On: 09/27/2022 16:40    Pertinent labs & imaging results that were available during my care of the patient were reviewed by me and considered in my medical decision making (see MDM for details).  Medications Ordered in ED Medications  hydrALAZINE (APRESOLINE) injection 10 mg (has no administration in time range)  prochlorperazine (COMPAZINE) injection 10 mg (has no administration in time range)  diphenhydrAMINE (BENADRYL) injection 25 mg (has no administration in time range)                                                                                                                                     Procedures Procedures  (including critical care time)  Medical Decision Making / ED Course   This patient presents to the ED for concern of hypertension, headache, this involves an extensive number of treatment options, and is a complaint that carries with it a high risk of complications and morbidity.  The differential diagnosis includes hypertensive urgency, hypertensive emergency, migraine headache, tension headache, medication side effect  MDM: Patient seen emerged part for evaluation of hypertension and  headache.  Physical exam largely unremarkable with no focal motor or sensory deficits.  No cranial nerve deficits.  Laboratory evaluation with a leukopenia to 3.8, mild hypokalemia to 3.3, BNP slightly elevated to 118.3 but chest x-ray with no evidence  of pulmonary edema.  High-sensitivity troponin is normal.  Patient just received a CT head on 09/22/2022 for the exact same complaints and in the setting of no neurologic deficits today, we will defer CT imaging at this time.  Patient received single dose hydralazine and a headache cocktail and on reevaluation her symptoms significantly improved.  In regards to the patient's constipation, it appears the senna is causing significant side effects and thus we decided to discontinue this medication and the patient will take high-dose MiraLAX and Dulcolax if the Linzess is not controlling her constipation.  At this time, patient does not meet inpatient criteria for admission and is safe for discharge with outpatient follow-up hypertension significantly improved on reevaluation with last blood pressure 130/72.   Additional history obtained: \ -External records from outside source obtained and reviewed including: Chart review including previous notes, labs, imaging, consultation notes   Lab Tests: -I ordered, reviewed, and interpreted labs.   The pertinent results include:   Labs Reviewed  COMPREHENSIVE METABOLIC PANEL  CBC WITH DIFFERENTIAL/PLATELET  BRAIN NATRIURETIC PEPTIDE  TROPONIN I (HIGH SENSITIVITY)  TROPONIN I (HIGH SENSITIVITY)      EKG   EKG Interpretation  Date/Time:  Monday September 27 2022 15:48:33 EST Ventricular Rate:  69 PR Interval:  123 QRS Duration: 84 QT Interval:  406 QTC Calculation: 435 R Axis:   56 Text Interpretation: Sinus rhythm No significant change since last tracing Confirmed by Timi Reeser ([id number]) on 09/27/2022 4:02:05 PM         Imaging Studies ordered: I ordered imaging studies including chest x-ray I  independently visualized and interpreted imaging. I agree with the radiologist interpretation   Medicines ordered and prescription drug management: Meds ordered this encounter  Medications   hydrALAZINE (APRESOLINE) injection 10 mg   prochlorperazine (COMPAZINE) injection 10 mg   diphenhydrAMINE (BENADRYL) injection 25 mg    -I have reviewed the patients home medicines and have made adjustments as needed  Critical interventions none   Cardiac Monitoring: The patient was maintained on a cardiac monitor.  I personally viewed and interpreted the cardiac monitored which showed an underlying rhythm of: NSR  Social Determinants of Health:  Factors impacting patients care include: none   Reevaluation: After the interventions noted above, I reevaluated the patient and found that they have :improved  Co morbidities that complicate the patient evaluation  Past Medical History:  Diagnosis Date   Asthma in adult, mild intermittent, uncomplicated    Chronic anticoagulation    Chronic constipation    COVID-19 virus infection 10/19/2021   Eczema    GERD (gastroesophageal reflux disease)    H. pylori infection 09/24/2021   Identified on EGD biopsy.  Treatment will be discussed with her GI doctor.  SHe will need a very simple regimen which has no contraindications in combination with her existing medication regimen.     Hemoperitoneum 09/27/2021   Due to splenic lac occurring after colonoscopy on 09/24/21.  Xarelto resumed.    History of kidney stones    Hx of vaginal bleeding, resolved, ultrasound completed 10/11/2018   Hypertension    Laceration of spleen    Presumed complication of colonoscopy 09/24/21.  Pain and hemoperitoneum.  Xarelto resumed.  Short term opioid pain management.     Personal history of atrial fibrillation, s/p ablation, now in NSR but remains on anticoaulation 11/26/2015   Followed in cardiology clinic       Dispostion: I considered admission for this patient, but she  does not  meet inpatient criteria for admission at this time she is safe for discharge with outpatient follow-up     Final Clinical Impression(s) / ED Diagnoses Final diagnoses:  None     '@PCDICTATION'$ @    Teressa Lower, MD 09/27/22 2300

## 2022-09-27 NOTE — Telephone Encounter (Signed)
Patient called back stating current BP is 210/110. Confirmed she took correct doses of metoprolol, spironolactone, and losartan today at 0730. She states she feels, "jittery, h/a, and dizzy." She is advised to call 911 now per her PCP.

## 2022-09-29 ENCOUNTER — Telehealth: Payer: Self-pay

## 2022-09-29 ENCOUNTER — Ambulatory Visit: Payer: 59

## 2022-09-29 NOTE — Telephone Encounter (Signed)
     Patient  visit on 1/31  at [facility]   Patient declined call   Have you been able to follow up with your primary care physician? Na   The patient was or was not able to obtain any needed medicine or equipment. na   Are there diet recommendations that you are having difficulty following? Na   Patient expresses understanding of discharge instructions and education provided has no other needs at this time.  [place], [place] [phone] [address], [place], [place] [address] Phone: [phone] Email: [email or web address]

## 2022-09-30 ENCOUNTER — Ambulatory Visit (INDEPENDENT_AMBULATORY_CARE_PROVIDER_SITE_OTHER): Payer: 59 | Admitting: Internal Medicine

## 2022-09-30 ENCOUNTER — Encounter (HOSPITAL_COMMUNITY): Payer: Self-pay | Admitting: *Deleted

## 2022-09-30 VITALS — BP 150/77 | HR 75 | Temp 98.5°F | Ht 67.0 in | Wt 131.8 lb

## 2022-09-30 DIAGNOSIS — F418 Other specified anxiety disorders: Secondary | ICD-10-CM

## 2022-09-30 DIAGNOSIS — M810 Age-related osteoporosis without current pathological fracture: Secondary | ICD-10-CM

## 2022-09-30 DIAGNOSIS — K5909 Other constipation: Secondary | ICD-10-CM

## 2022-09-30 DIAGNOSIS — I10 Essential (primary) hypertension: Secondary | ICD-10-CM

## 2022-09-30 DIAGNOSIS — R4589 Other symptoms and signs involving emotional state: Secondary | ICD-10-CM | POA: Insufficient documentation

## 2022-09-30 DIAGNOSIS — E7841 Elevated Lipoprotein(a): Secondary | ICD-10-CM | POA: Diagnosis not present

## 2022-09-30 DIAGNOSIS — Z87891 Personal history of nicotine dependence: Secondary | ICD-10-CM

## 2022-09-30 MED ORDER — ALENDRONATE SODIUM 70 MG PO TABS: 70.0000 mg | ORAL_TABLET | ORAL | 3 refills | Status: DC

## 2022-09-30 MED ORDER — METOPROLOL SUCCINATE ER 50 MG PO TB24: 50.0000 mg | ORAL_TABLET | Freq: Every day | ORAL | 5 refills | Status: DC

## 2022-09-30 MED ORDER — SPIRONOLACTONE 25 MG PO TABS: 50.0000 mg | ORAL_TABLET | Freq: Every day | ORAL | 11 refills | Status: DC

## 2022-09-30 MED ORDER — LOSARTAN POTASSIUM 50 MG PO TABS: 100.0000 mg | ORAL_TABLET | Freq: Every day | ORAL | 3 refills | Status: DC

## 2022-09-30 NOTE — Assessment & Plan Note (Signed)
Bowels currently loose with Linzess 145 mg daily and miralax 1 dose daily.  She is very focused on maintaining a daily bm and is fixated on wanting an immediate response after taking her medicine.  LOTS of discussion and education about this.

## 2022-09-30 NOTE — Progress Notes (Signed)
ED visits on 09/22/22 and 09/27/22 as well as multiple phonecalls with me, following last visit here in [facility] on 09/16/22, at which time we changed to a new bowel regimen then subsequently initiated a change in her diltiazem (potential contributor to chronic constipation) to metoprolol with plan for close f/u.  Presented to ED upon our recommendation on 09/27/22 at which time she called our office reporting high BP to 220s associated with chest pain and headache (she didn't endorse cp in the ED).    In ED, BP 177/97, HR 68.  Rec'd hydral 10 mg iv/compazine 10 mg iv/benadryl 25 mg iv.   She recalls being told that she still had constipation and was recommended to do another Miralax "clean out" and to continue miralax 1 dose daily along with her Linzess (she is taking 145 mg)  Having loose bowel movements daily with this regimen.  Feels fatigued and has LLQ soreness now.  No blood in stool.  Also reports discomfort in chest, a soreness, not associated with dyspnea; no palpitations.  Also heaving a nagging dull frontal headache and sniffles, sneezing, coughing, wonders if it is related.  Tylenol helps ease her HA.  Overall very anxious.  Has had no difficulty with appetite or eating.    BP (!) 150/77 (BP Location: Right Arm, Patient Position: Sitting, Cuff Size: Small)   Pulse 75   Temp 98.5 F (36.9 C) (Oral)   Ht '5\' 7"'$  (1.702 m)   Wt 131 lb 12.8 oz (59.8 kg)   SpO2 100%   BMI 20.64 kg/m  Exam unchanged.  Abd is flat, mild tenderness w/o palpable abnormality in LLQ.  Looks tired and anxious, repeats herself, asks for clarification, then gets flustered when she gets mixed up.  Assessment and Plan:  Ms. Sakuma is struggling with manageable problems but her anxiety and focus on bodily sensations and function is problematic.  We have advised her to call the [facility] operator to speak to our on call doctors after hours rather than calling the nurse help line, as we may be able to avoid an ED visit or arrange for close  f/u.  Her ED utilization is high and unnecessary.  She calls the office several times a week and needs lots of reassurance.  Once we get her on a stable regimen, I'll address a better way to have her keep track of her meds and indications.  A cognitive eval is in order at a near future visit! Anxiety vs cognitive impairment?  Hypertension BP has been uncontrolled after changing diltiazem to metoprolol currently 50 mg daily.  Today 150/77 after taking her medicines this am.    Plan: -Continue metoprolol 50 mg daily -Continue losartan 50 mg 2 tabs daily (she is so easily confused; I won't change to a single 100 mg tab yet) -Increase spironolactone from 25 mg daily to 2x'25mg'$  daily (eventually change to 50 mg tab daily if effective). -Continue Kcl 10 mEQ daily (K was 3.3 in the ED just days ago; she tends toward hypokalemia despite supplement) - recheck in 3 wks with bmp.  The hope is that we will be able to stop potassium with the increase in spiro. -Given a BP log for recording  Hyperlipidemia Didn't bring her cholesterol meds in her bag today and she isn't sure if she is taking them.  We'll f/u at future visit.    Chronic constipation Bowels currently loose with Linzess 145 mg daily and miralax 1 dose daily.  She is very focused on  maintaining a daily bm and is fixated on wanting an immediate response after taking her medicine.  LOTS of discussion and education about this.

## 2022-09-30 NOTE — Assessment & Plan Note (Signed)
Didn't bring her cholesterol meds in her bag today and she isn't sure if she is taking them.  We'll f/u at future visit.

## 2022-09-30 NOTE — Assessment & Plan Note (Addendum)
BP has been uncontrolled after changing diltiazem to metoprolol currently 50 mg daily.  Today 150/77 after taking her medicines this am.    Plan: -Continue metoprolol 50 mg daily -Continue losartan 50 mg 2 tabs daily (she is so easily confused; I won't change to a single 100 mg tab yet) -Increase spironolactone from 25 mg daily to 2x'25mg'$  daily (eventually change to 50 mg tab daily if effective). -Continue Kcl 10 mEQ daily (K was 3.3 in the ED just days ago; she tends toward hypokalemia despite supplement) - recheck in 3 wks with bmp.  The hope is that we will be able to stop potassium with the increase in spiro. -Given a BP log for recording

## 2022-10-01 IMAGING — DX DG CHEST 2V
2 series · 2 of 2 positions shown · non-contrast
Comparison: 04/21/2021

CLINICAL DATA: Cough and chest pain

EXAM:
CHEST - 2 VIEW

[w chest pa]
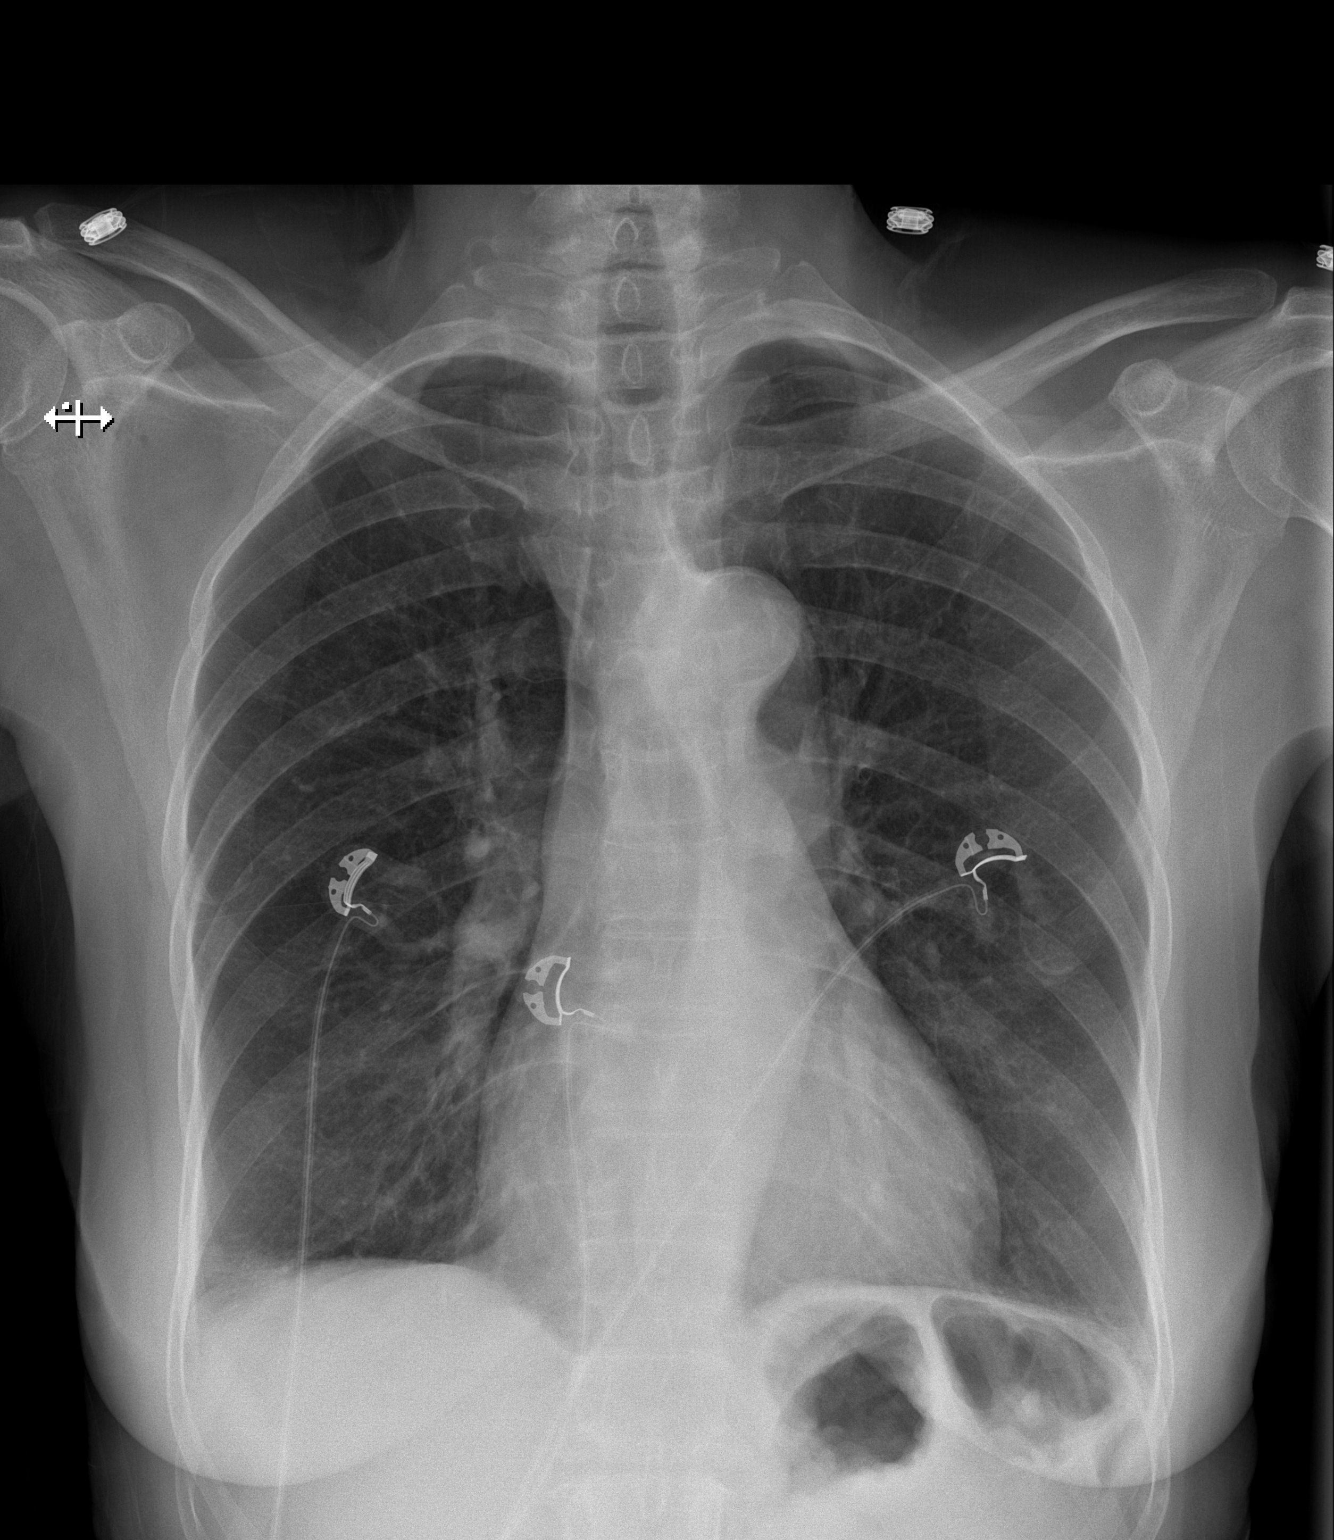

[w chest lat]
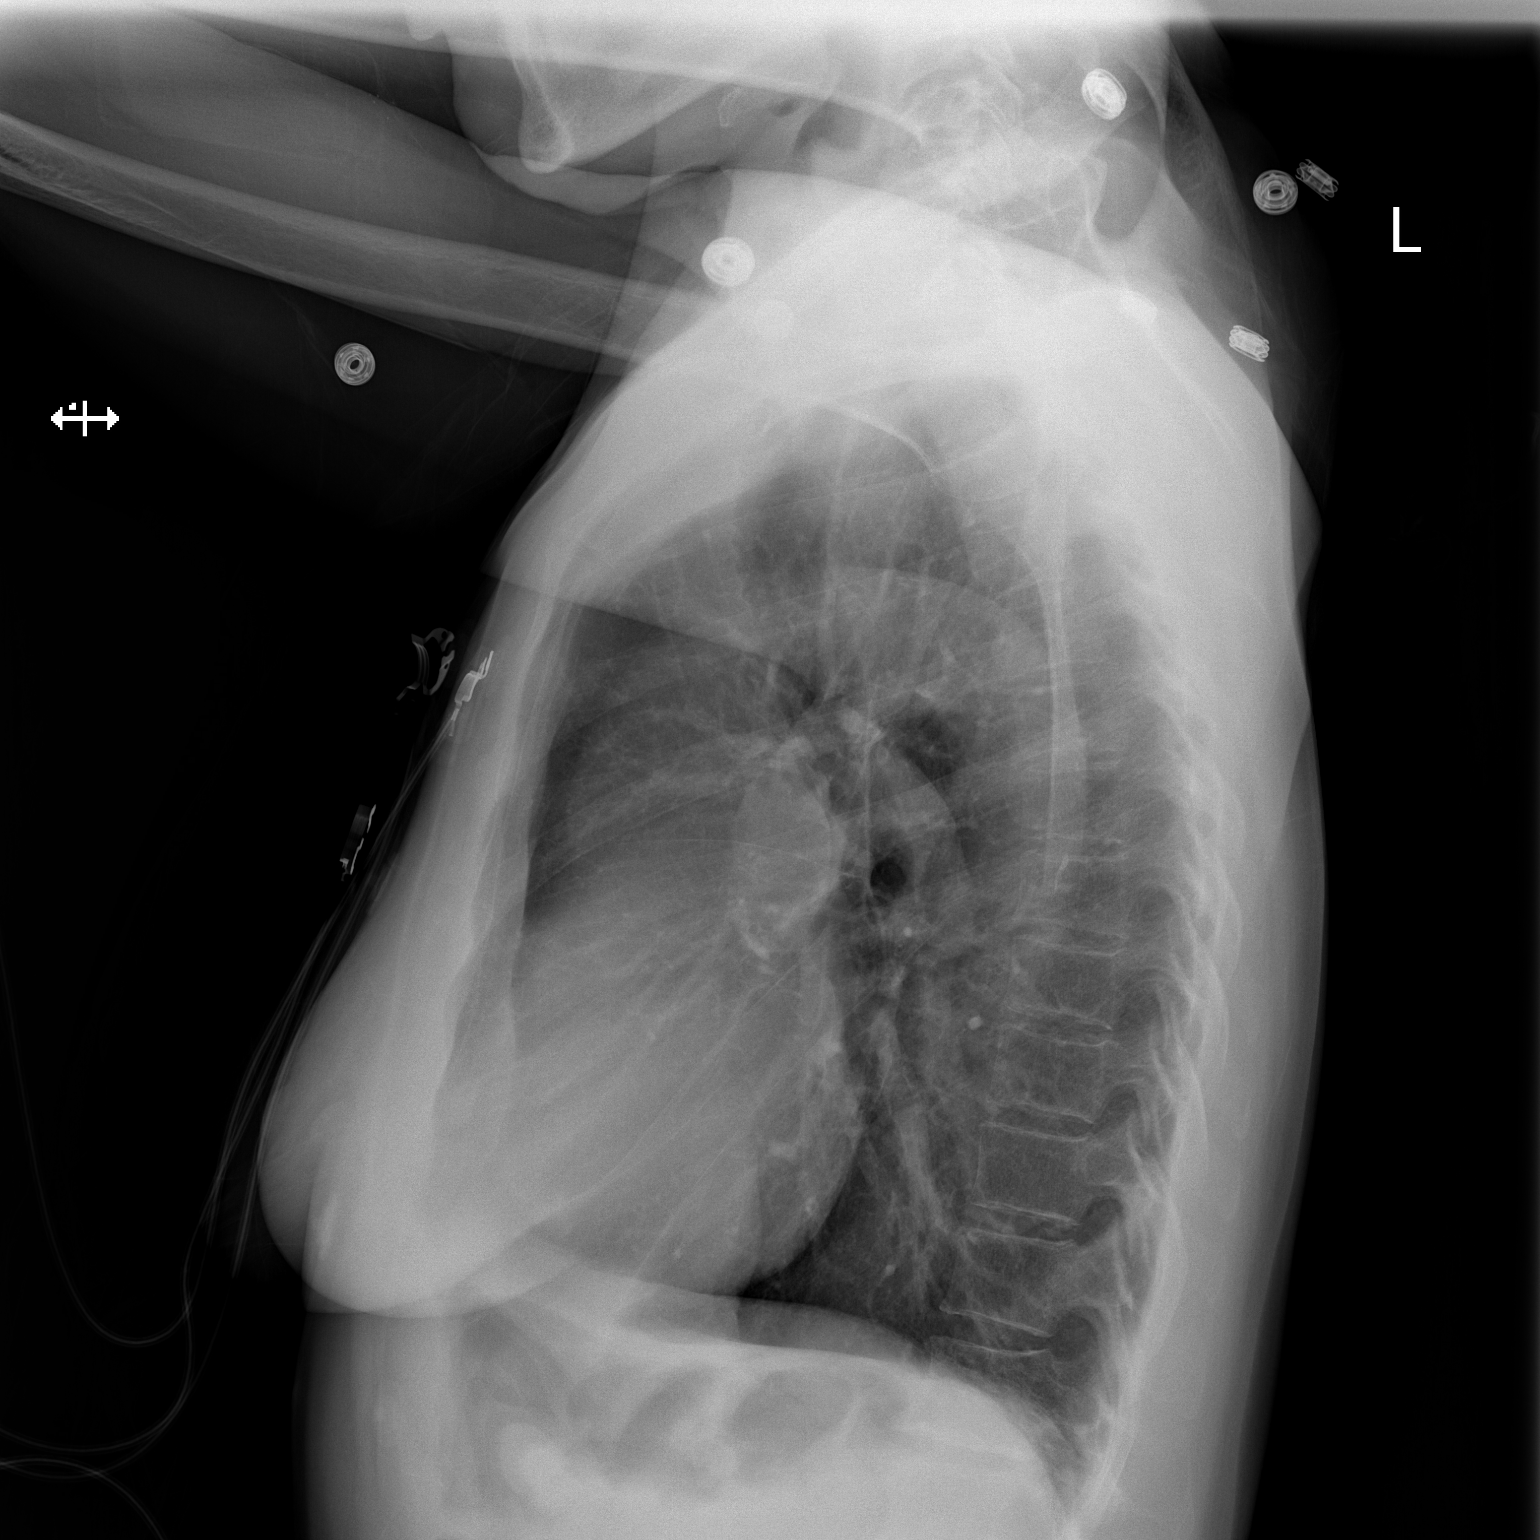

[2 of 2 positions shown; findings below may reference images not displayed]

FINDINGS: Heart size is normal. Aortic atherosclerotic calcification is noted.
The lungs are clear. Pulmonary vascularity is normal. No effusions.
No acute bone finding.
IMPRESSION: No active cardiopulmonary disease.

## 2022-10-04 ENCOUNTER — Encounter: Payer: Self-pay | Admitting: Student

## 2022-10-04 ENCOUNTER — Other Ambulatory Visit: Payer: Self-pay

## 2022-10-04 ENCOUNTER — Ambulatory Visit (INDEPENDENT_AMBULATORY_CARE_PROVIDER_SITE_OTHER): Payer: 59 | Admitting: Student

## 2022-10-04 VITALS — BP 139/75 | HR 71 | Temp 97.8°F | Ht 67.0 in | Wt 132.7 lb

## 2022-10-04 DIAGNOSIS — M159 Polyosteoarthritis, unspecified: Secondary | ICD-10-CM

## 2022-10-04 DIAGNOSIS — M810 Age-related osteoporosis without current pathological fracture: Secondary | ICD-10-CM

## 2022-10-04 DIAGNOSIS — H6123 Impacted cerumen, bilateral: Secondary | ICD-10-CM

## 2022-10-04 DIAGNOSIS — H9193 Unspecified hearing loss, bilateral: Secondary | ICD-10-CM

## 2022-10-04 MED ORDER — DICLOFENAC SODIUM 1 % EX GEL: 2.0000 g | Freq: Four times a day (QID) | CUTANEOUS | 0 refills | Status: DC

## 2022-10-04 MED ORDER — FLUTICASONE PROPIONATE 50 MCG/ACT NA SUSP: 1.0000 | Freq: Every day | NASAL | 2 refills | Status: DC

## 2022-10-04 NOTE — Patient Instructions (Addendum)
Thank you so much for coming to the clinic today!   I'm sorry draining your ears hurt so much. I placed a referral to the ENT doctors who specialize with ears. I am also sending a medication called Flonase to help with the drainage in your sinuses.   If you have any questions please feel free to the call the clinic at anytime at [phone]. It was a pleasure seeing you!  Best, Dr. Sanjuana Mae

## 2022-10-05 ENCOUNTER — Telehealth: Payer: Self-pay | Admitting: *Deleted

## 2022-10-05 DIAGNOSIS — R0981 Nasal congestion: Secondary | ICD-10-CM | POA: Diagnosis not present

## 2022-10-05 DIAGNOSIS — R448 Other symptoms and signs involving general sensations and perceptions: Secondary | ICD-10-CM | POA: Diagnosis not present

## 2022-10-05 DIAGNOSIS — H6123 Impacted cerumen, bilateral: Secondary | ICD-10-CM | POA: Diagnosis not present

## 2022-10-05 DIAGNOSIS — Z8669 Personal history of other diseases of the nervous system and sense organs: Secondary | ICD-10-CM | POA: Insufficient documentation

## 2022-10-05 DIAGNOSIS — H6120 Impacted cerumen, unspecified ear: Secondary | ICD-10-CM | POA: Insufficient documentation

## 2022-10-05 NOTE — Assessment & Plan Note (Addendum)
For the last 2 weeks, patient has been congested.  She states this happens frequently because of her allergies.  She has tried taking cetirizine, which helped a little bit with the congestion.  She has had intermittent fevers as well, accompanied with a dry cough.  She has taken a COVID test and a flu test which were both negative.  She woke up this morning and could not hear out of either of her ears.  She denies any drainage, or pain from the ears.  When I speak loudly she is able to hear me.  On top of that, seems like even a normal volume she would be able to hear me intermittently.  On my exam, no erythema seen in ears bilaterally, however significant amount of wax is present and tympanic membrane is not able to be visualized bilaterally.  She said this is happened to her previously, last time being "a couple years ago" and her ears were cleaned out and she was able to hear again.  Attempt was made to clean out her ears, however patient could not tolerate it was complaining of pain so attempt was stopped.  Less concern for neurologic cause of hearing loss as patient does not have any neurologic deficits, and there is evidence of extensive earwax on exam.  Plan: - Patient would likely benefit from an ENT referral, placed - Send Flonase in and recommended over-the-counter decongestions

## 2022-10-05 NOTE — Telephone Encounter (Signed)
Patient called in to review BP meds and doses. Confirmed she should take:   metoprolol 50 mg 1 tab daily when she completes metoprolol 25 mg 2 tabs daily.  Confirmed losartan 50 mg 2 tabs daily  Spironolactone 25 mg 2 tabs daily.  She was very Patent attorney.

## 2022-10-05 NOTE — Assessment & Plan Note (Signed)
Patient complaining of pain in her joints as well.  She has known history of primary osteoarthritis, this time her knees are causing distress.  She denies any recent trauma to her knees or falls.  She has been using Voltaren gel, and has been doing well with it until she ran out.  Will resupply.

## 2022-10-05 NOTE — Progress Notes (Signed)
CC: Hearing loss  HPI:  [name] is a 72 y.o. female living with a history stated below and presents today for hearing loss. Please see problem based assessment and plan for additional details.  Past Medical History:  Diagnosis Date   Asthma in adult, mild intermittent, uncomplicated    Chronic anticoagulation    Chronic constipation    COVID-19 virus infection 10/19/2021   Eczema    GERD (gastroesophageal reflux disease)    H. pylori infection 09/24/2021   Identified on EGD biopsy.  Treatment will be discussed with her GI doctor.  SHe will need a very simple regimen which has no contraindications in combination with her existing medication regimen.     Hemoperitoneum 09/27/2021   Due to splenic lac occurring after colonoscopy on 09/24/21.  Xarelto resumed.    History of kidney stones    Hx of vaginal bleeding, resolved, ultrasound completed 10/11/2018   Hypertension    Laceration of spleen    Presumed complication of colonoscopy 09/24/21.  Pain and hemoperitoneum.  Xarelto resumed.  Short term opioid pain management.     Personal history of atrial fibrillation, s/p ablation, now in NSR but remains on anticoaulation 11/26/2015   Followed in cardiology clinic     Current Outpatient Medications on File Prior to Visit  Medication Sig Dispense Refill   alendronate (FOSAMAX) 70 MG tablet Take 1 tablet (70 mg total) by mouth every 7 (seven) days. Take with a full glass of water on an empty stomach. 12 tablet 3   atorvastatin (LIPITOR) 40 MG tablet Take 1 tablet (40 mg total) by mouth daily. 90 tablet 3   cetirizine (ZYRTEC) 10 MG tablet Take 10 mg by mouth daily.     ezetimibe (ZETIA) 10 MG tablet Take 1 tablet (10 mg total) by mouth daily. 30 tablet 11   fluticasone (FLOVENT HFA) 110 MCG/ACT inhaler Inhale 1 puff into the lungs daily. Inhale 1 puff into the lungs daily. 1 each 12   HYDROcodone-acetaminophen (NORCO/VICODIN) 5-325 MG tablet 1/2 to 1 tablet only when needed for severe  pain, as often as every 6 hours. (Patient not taking: Reported on 09/17/2022) 30 tablet 0   levalbuterol (XOPENEX HFA) 45 MCG/ACT inhaler Inhale 1 puff into the lungs every 6 (six) hours as needed for shortness of breath. 1 each 5   linaclotide (LINZESS) 72 MCG capsule Take 1 capsule (72 mcg total) by mouth daily before breakfast. 30 capsule 0   losartan (COZAAR) 50 MG tablet Take 2 tablets (100 mg total) by mouth daily. 180 tablet 3   metoprolol succinate (TOPROL-XL) 50 MG 24 hr tablet Take 1 tablet (50 mg total) by mouth daily. Take with or immediately following a meal. 30 tablet 5   montelukast (SINGULAIR) 10 MG tablet Take 1 tablet (10 mg total) by mouth at bedtime. 90 tablet 3   omeprazole (PRILOSEC) 40 MG capsule Take 1 capsule (40 mg total) by mouth in the morning and at bedtime. 90 capsule 3   polyethylene glycol (MIRALAX / GLYCOLAX) 17 g packet Take 17 g by mouth 2 (two) times daily. 14 each 0   potassium chloride (KLOR-CON M) 10 MEQ tablet Take 1 tablet (10 mEq total) by mouth daily. 60 tablet 0   rivaroxaban (XARELTO) 20 MG TABS tablet Take 1 tablet (20 mg total) by mouth daily with supper. 30 tablet 11   senna-docusate (SENNA-S) 8.6-50 MG tablet Take 1 tablet by mouth 2 (two) times daily. Or as directed by physician.  60 tablet 3   spironolactone (ALDACTONE) 25 MG tablet Take 2 tablets (50 mg total) by mouth daily. For blood pressure. 60 tablet 11   tiotropium (SPIRIVA) 18 MCG inhalation capsule Place 1 capsule (18 mcg total) into inhaler and inhale daily. 90 capsule 3   No current facility-administered medications on file prior to visit.    Family History  Problem Relation Age of Onset   Heart attack Mother        Young age   Breast cancer Sister        Late 29s; now s/p mastectomy    Lung cancer Brother        x 2   Cancer Sister    Cancer Sister    Cancer Sister    Colon cancer Neg Hx    Colon polyps Neg Hx    Esophageal cancer Neg Hx    Rectal cancer Neg Hx    Stomach  cancer Neg Hx     Social History   Socioeconomic History   Marital status: Single    Spouse name: Not on file   Number of children: Not on file   Years of education: Not on file   Highest education level: Not on file  Occupational History   Occupation: Retired  Tobacco Use   Smoking status: Former    Types: Cigarettes    Quit date: 10/24/2015    Years since quitting: 6.9   Smokeless tobacco: Never  Vaping Use   Vaping Use: Never used  Substance and Sexual Activity   Alcohol use: Not Currently   Drug use: Not Currently    Types: Marijuana    Comment: Smoked marijuana in the past.   Sexual activity: Yes    Partners: Male    Birth control/protection: Post-menopausal  Other Topics Concern   Not on file  Social History Narrative   Lives alone in [place], [place] and has 2 daughters who are within driving distance   Enjoys watching soap operas, reading, exercise   Social Determinants of Health   Financial Resource Strain: Low Risk  (10/04/2022)   Overall Financial Resource Strain (CARDIA)    Difficulty of Paying Living Expenses: Not hard at all  Food Insecurity: No Food Insecurity (10/04/2022)   Hunger Vital Sign    Worried About Running Out of Food in the Last Year: Never true    [place] in the Last Year: Never true  Transportation Needs: No Transportation Needs (10/04/2022)   PRAPARE - Hydrologist (Medical): No    Lack of Transportation (Non-Medical): No  Physical Activity: Insufficiently Active (10/04/2022)   Exercise Vital Sign    Days of Exercise per Week: 2 days    Minutes of Exercise per Session: 30 min  Stress: No Stress Concern Present (10/04/2022)   [place]    Feeling of Stress : Not at all  Social Connections: Socially Isolated (10/04/2022)   Social Connection and Isolation Panel [NHANES]    Frequency of Communication with Friends and Family: More than three  times a week    Frequency of Social Gatherings with Friends and Family: Three times a week    Attends Religious Services: Never    Active Member of Clubs or Organizations: No    Attends Archivist Meetings: Never    Marital Status: Separated  Intimate Partner Violence: Not At Risk (10/04/2022)   Humiliation, Afraid, Rape, and Kick questionnaire  Fear of Current or Ex-Partner: No    Emotionally Abused: No    Physically Abused: No    Sexually Abused: No    Review of Systems: ROS negative except for what is noted on the assessment and plan.  Vitals:   10/04/22 1515  BP: 139/75  Pulse: 71  Temp: 97.8 F (36.6 C)  TempSrc: Oral  SpO2: 100%  Weight: 132 lb 11.2 oz (60.2 kg)  Height: 5' 7"$  (1.702 m)    Physical Exam: Constitutional: well-appearing woman, talking loudly, in no acute distress HENT: normocephalic atraumatic, mucous membranes moist, significant wax present blocking tympanic membrane.  No erythema noted or drainage.  Mild tenderness to palpation of the sinuses Eyes: conjunctiva non-erythematous Neck: supple, no lymph nodes palpated Cardiovascular: regular rate and rhythm, no m/r/g Pulmonary/Chest: normal work of breathing on room air, lungs clear to auscultation bilaterally Abdominal: soft, non-tender, non-distended Neurological: alert & oriented x 3, 5/5 strength in bilateral upper and lower extremities, normal gait   Assessment & Plan:   Excessive ear wax For the last 2 weeks, patient has been congested.  She states this happens frequently because of her allergies.  She has tried taking cetirizine, which helped a little bit with the congestion.  She has had intermittent fevers as well, accompanied with a dry cough.  She has taken a COVID test and a flu test which were both negative.  She woke up this morning and could not hear out of either of her ears.  She denies any drainage, or pain from the ears.  When I speak loudly she is able to hear me.  On top of  that, seems like even a normal volume she would be able to hear me intermittently.  On my exam, no erythema seen in ears bilaterally, however significant amount of wax is present and tympanic membrane is not able to be visualized bilaterally.  She said this is happened to her previously, last time being "a couple years ago" and her ears were cleaned out and she was able to hear again.  Attempt was made to clean out her ears, however patient could not tolerate it was complaining of pain so attempt was stopped.  Plan: - Patient would likely benefit from an ENT referral, placed - Send Flonase in and recommended over-the-counter decongestions  Primary osteoarthritis involving multiple joints Patient complaining of pain in her joints as well.  She has known history of primary osteoarthritis, this time her knees are causing distress.  She denies any recent trauma to her knees or falls.  She has been using Voltaren gel, and has been doing well with it until she ran out.  Will resupply.  Patient discussed with Dr. Thomasene Ripple, M.D. [facility], PGY-1 Phone: [phone] Date 10/05/2022 Time 10:24 AM

## 2022-10-06 ENCOUNTER — Telehealth: Payer: Self-pay

## 2022-10-06 NOTE — Telephone Encounter (Signed)
Return pt's call. Stated she gets confused with her medications; Spironolactone and Metoprolol. Reviewed her meds with her - Spironolactone 25 mg take 2 tabs daily. And Metoprolol 50 mg daily; she repeated correctly. She's also requesting something for pain for arthritis in her legs. Stated she has taken Hydrocodone before. Also she went to ENT; they told her she had blood in her ear which had to be removed before they could remove the wax. Stated it looked like the blood  in her right ear was from a tear from a previous ear cleaning.

## 2022-10-06 NOTE — Telephone Encounter (Signed)
Called pt - informed of Dr Jimmye Norman' response. She stated she will wait to see her doctor at her 11/25/22 appt; she does not want to see anyone else and she will try [facility] as suggested (stated she already has Biofreeze at home). Stated her legs are aching "a little bit"; her ears are ok now and she can wait until April.

## 2022-10-06 NOTE — Telephone Encounter (Signed)
Requesting to speak with a nurse about medications. Please call pt back.  °

## 2022-10-07 ENCOUNTER — Telehealth: Payer: Self-pay

## 2022-10-07 NOTE — Telephone Encounter (Signed)
Pt called again this morning ... Stating that the RN nurse told her that her PCP will see her this morning .Marland Kitchen I read the note to  insure  what was said in the conversation between pt and RN  then I re laid the message to pt ... Pt then said that is not how it went I went to [facility] and asked   she stated that the pt insisted on only seeing PCP and that  is why she canceled the appt on the 2/29 and then told pt   what the RN said that is when pt told the truth  and said yes that is what happened  and if she can't see Dr Jimmye Norman today I  explained  the appt was with another Dr because Dr Jimmye Norman is not in clinic today .Marland Kitchen And yet again pt agree to keep her appt with PCP  Jimmye Norman ) on 4/4

## 2022-10-07 NOTE — Telephone Encounter (Signed)
        Patient  visited [facility] on 09/27/2022  for Hypertensive urgency.   Telephone encounter attempt :  1st  A HIPAA compliant voice message was left requesting a return call.  Instructed patient to call back at [phone].   [place] Resource Care Guide   [email or web address]  ?? [phone]   Website: [email or web address]  [email or web address]

## 2022-10-07 NOTE — Progress Notes (Signed)
Internal Medicine Clinic Attending  Case discussed with the resident at the time of the visit.  We reviewed the resident's history and exam and pertinent patient test results.  I agree with the assessment, diagnosis, and plan of care documented in the resident's note.  

## 2022-10-07 NOTE — Telephone Encounter (Signed)
Pt called right back .[name] Stating that  she  wanting to explain why she didn't want to see another dr  is because she came in and tried to have her  ears cleans  but  it was unsuccessful  and she went to the ENT  they some how  made her bleed  and she was upset .[name] But she needs to come  in  I explain we are booked up today she then request the following wk and with DR Jimmye Norman yet again I explained that her PC does not have any openings and it will have to be with another DR .Shelby KitchenMrs Kulzer  then took   an appt with Dr  Humphrey Rolls on 2/20

## 2022-10-08 ENCOUNTER — Telehealth: Payer: Self-pay

## 2022-10-08 NOTE — Telephone Encounter (Signed)
     Patient  visit on 09/27/2022  at [facility] was for Hypertensive urgency.  Have you been able to follow up with your primary care physician? Yes  The patient was or was not able to obtain any needed medicine or equipment. Patient was able to obtain medication.  Are there diet recommendations that you are having difficulty following? No  Patient expresses understanding of discharge instructions and education provided has no other needs at this time. Yes   [place] Resource Care Guide   [email or web address]  ?? [phone]   Website: [email or web address]  [email or web address]

## 2022-10-11 NOTE — Telephone Encounter (Signed)
Done

## 2022-10-12 ENCOUNTER — Ambulatory Visit (INDEPENDENT_AMBULATORY_CARE_PROVIDER_SITE_OTHER): Payer: 59 | Admitting: *Deleted

## 2022-10-12 ENCOUNTER — Ambulatory Visit (INDEPENDENT_AMBULATORY_CARE_PROVIDER_SITE_OTHER): Payer: 59 | Admitting: Internal Medicine

## 2022-10-12 ENCOUNTER — Encounter: Payer: Self-pay | Admitting: *Deleted

## 2022-10-12 VITALS — BP 148/70 | HR 76 | Wt 132.7 lb

## 2022-10-12 VITALS — BP 148/70 | HR 76 | Ht 67.0 in | Wt 132.8 lb

## 2022-10-12 DIAGNOSIS — K5909 Other constipation: Secondary | ICD-10-CM

## 2022-10-12 DIAGNOSIS — Z0000 Encounter for general adult medical examination without abnormal findings: Secondary | ICD-10-CM

## 2022-10-12 DIAGNOSIS — M810 Age-related osteoporosis without current pathological fracture: Secondary | ICD-10-CM | POA: Diagnosis not present

## 2022-10-12 DIAGNOSIS — I10 Essential (primary) hypertension: Secondary | ICD-10-CM

## 2022-10-12 DIAGNOSIS — Z8639 Personal history of other endocrine, nutritional and metabolic disease: Secondary | ICD-10-CM | POA: Diagnosis not present

## 2022-10-12 DIAGNOSIS — E785 Hyperlipidemia, unspecified: Secondary | ICD-10-CM | POA: Diagnosis not present

## 2022-10-12 DIAGNOSIS — E7841 Elevated Lipoprotein(a): Secondary | ICD-10-CM | POA: Diagnosis not present

## 2022-10-12 DIAGNOSIS — H6123 Impacted cerumen, bilateral: Secondary | ICD-10-CM | POA: Diagnosis not present

## 2022-10-12 MED ORDER — LOSARTAN POTASSIUM 100 MG PO TABS: 100.0000 mg | ORAL_TABLET | Freq: Every day | ORAL | 3 refills | Status: DC

## 2022-10-12 MED ORDER — ATORVASTATIN CALCIUM 40 MG PO TABS: 40.0000 mg | ORAL_TABLET | Freq: Every day | ORAL | 3 refills | Status: DC

## 2022-10-12 MED ORDER — LINACLOTIDE 145 MCG PO CAPS: 145.0000 ug | ORAL_CAPSULE | Freq: Every day | ORAL | 0 refills | Status: DC

## 2022-10-12 NOTE — Progress Notes (Unsigned)
CC: ear pain  HPI:  [name] is a 72 y.o. with medical history of HTN, HLD, sinusitis, GERD, paroxsymal afib presenting to [facility] for ear pain.   Please see problem-based list for further details, assessments, and plans.  Past Medical History:  Diagnosis Date   Asthma in adult, mild intermittent, uncomplicated    Chronic anticoagulation    Chronic constipation    COVID-19 virus infection 10/19/2021   Eczema    GERD (gastroesophageal reflux disease)    H. pylori infection 09/24/2021   Identified on EGD biopsy.  Treatment will be discussed with her GI doctor.  SHe will need a very simple regimen which has no contraindications in combination with her existing medication regimen.     Hemoperitoneum 09/27/2021   Due to splenic lac occurring after colonoscopy on 09/24/21.  Xarelto resumed.    History of kidney stones    Hx of vaginal bleeding, resolved, ultrasound completed 10/11/2018   Hypertension    Laceration of spleen    Presumed complication of colonoscopy 09/24/21.  Pain and hemoperitoneum.  Xarelto resumed.  Short term opioid pain management.     Personal history of atrial fibrillation, s/p ablation, now in NSR but remains on anticoaulation 11/26/2015   Followed in cardiology clinic     Current Outpatient Medications (Endocrine & Metabolic):    alendronate (FOSAMAX) 70 MG tablet, Take 1 tablet (70 mg total) by mouth every 7 (seven) days. Take with a full glass of water on an empty stomach.  Current Outpatient Medications (Cardiovascular):    atorvastatin (LIPITOR) 40 MG tablet, Take 1 tablet (40 mg total) by mouth daily.   ezetimibe (ZETIA) 10 MG tablet, Take 1 tablet (10 mg total) by mouth daily.   losartan (COZAAR) 50 MG tablet, Take 2 tablets (100 mg total) by mouth daily.   metoprolol succinate (TOPROL-XL) 50 MG 24 hr tablet, Take 1 tablet (50 mg total) by mouth daily. Take with or immediately following a meal.   spironolactone (ALDACTONE) 25 MG tablet, Take 2 tablets (50  mg total) by mouth daily. For blood pressure.  Current Outpatient Medications (Respiratory):    cetirizine (ZYRTEC) 10 MG tablet, Take 10 mg by mouth daily.   fluticasone (FLONASE) 50 MCG/ACT nasal spray, Place 1 spray into both nostrils daily.   fluticasone (FLOVENT HFA) 110 MCG/ACT inhaler, Inhale 1 puff into the lungs daily. Inhale 1 puff into the lungs daily.   levalbuterol (XOPENEX HFA) 45 MCG/ACT inhaler, Inhale 1 puff into the lungs every 6 (six) hours as needed for shortness of breath.   montelukast (SINGULAIR) 10 MG tablet, Take 1 tablet (10 mg total) by mouth at bedtime.   tiotropium (SPIRIVA) 18 MCG inhalation capsule, Place 1 capsule (18 mcg total) into inhaler and inhale daily.  Current Outpatient Medications (Analgesics):    HYDROcodone-acetaminophen (NORCO/VICODIN) 5-325 MG tablet, 1/2 to 1 tablet only when needed for severe pain, as often as every 6 hours. (Patient not taking: Reported on 09/17/2022)  Current Outpatient Medications (Hematological):    rivaroxaban (XARELTO) 20 MG TABS tablet, Take 1 tablet (20 mg total) by mouth daily with supper.  Current Outpatient Medications (Other):    diclofenac Sodium (VOLTAREN ARTHRITIS PAIN) 1 % GEL, Apply 2 g topically 4 (four) times daily.   linaclotide (LINZESS) 72 MCG capsule, Take 1 capsule (72 mcg total) by mouth daily before breakfast.   omeprazole (PRILOSEC) 40 MG capsule, Take 1 capsule (40 mg total) by mouth in the morning and at bedtime.  polyethylene glycol (MIRALAX / GLYCOLAX) 17 g packet, Take 17 g by mouth 2 (two) times daily.   potassium chloride (KLOR-CON M) 10 MEQ tablet, Take 1 tablet (10 mEq total) by mouth daily.   senna-docusate (SENNA-S) 8.6-50 MG tablet, Take 1 tablet by mouth 2 (two) times daily. Or as directed by physician.  Review of Systems:  Review of system negative unless stated in the problem list or HPI.    Physical Exam:  Vitals:   10/12/22 0843  BP: (!) 148/83  Pulse: 82  SpO2: 100%  Height:  5' 7"$  (1.702 m)    Physical Exam General: NAD HENT: NCAT Lungs: CTAB, no wheeze, rhonchi or rales.  Cardiovascular: Normal heart sounds, no r/m/g, 2+ pulses in all extremities. No LE edema Abdomen: No TTP, normal bowel sounds MSK: No asymmetry or muscle atrophy.  Skin: no lesions noted on exposed skin Neuro: Alert and oriented x4. CN grossly intact Psych: Normal mood and normal affect   Assessment & Plan:   No problem-specific Assessment & Plan notes found for this encounter.   See Encounters Tab for problem based charting.  Patient discussed with Dr. {NAMES:[id number]::"Guilloud","Hoffman","Mullen","Narendra","Vincent","Machen","Lau","Hatcher"} Shelby Schuller, MD Tillie Rung. [facility] Internal Medicine Residency, PGY-2   Ear Pain Seen by ENT on 10/05/22 and cerumen removed. Ear improved. Hearing improved. Finished medrol dose pack.   HTN On Losartan 100 mg BID, Metoprolol XR 50 mg qd, Spironolactone 50 mg qd. Recheck BMP this visit. 148/83  HLD Ensure she is taking cholesterol medication. Is supposed to be on lipitor 40 mg qd. Last lipid panel in 03/2022 with LDL 140. Repeat lipid.

## 2022-10-12 NOTE — Patient Instructions (Signed)
[name], it was a pleasure seeing you today! You endorsed feeling well today. Below are some of the things we talked about this visit. We look forward to seeing you in the follow up appointment!  Today we discussed: For your blood pressure; continue taking your medicine. We will change your losartan pill from the two 50 mg pills to one 100 mg tablet. Only take one tablet of 100 mg losartan. Continue taking metoprolol 50 mg daily. Continue taking spironolactone 50 mg daily.  I have refilled your lipitor 40 mg daily.   I have ordered the following labs today:   Lab Orders         Lipid Profile         BMP8+Anion Gap       Referrals ordered today:   Referral Orders  No referral(s) requested today     I have ordered the following medication/changed the following medications:   Stop the following medications: Medications Discontinued During This Encounter  Medication Reason   atorvastatin (LIPITOR) 40 MG tablet Reorder     Start the following medications: Meds ordered this encounter  Medications   atorvastatin (LIPITOR) 40 MG tablet    Sig: Take 1 tablet (40 mg total) by mouth daily.    Dispense:  90 tablet    Refill:  3    She reports unilateral leg pain in past but recently has been tolerating 20 mg.  Dose increase needed.     Follow-up: follow up with Dr. Jimmye Norman  Please make sure to arrive 15 minutes prior to your next appointment. If you arrive late, you may be asked to reschedule.   We look forward to seeing you next time. Please call our clinic at [phone] if you have any questions or concerns. The best time to call is Monday-Friday from 9am-4pm, but there is someone available 24/7. If after hours or the weekend, call the main hospital number and ask for the Internal Medicine Resident On-Call. If you need medication refills, please notify your pharmacy one week in advance and they will send [name] a request.  Thank you for letting us take part in your care.  Wishing you the best!  Thank you, [name], MD

## 2022-10-12 NOTE — Progress Notes (Signed)
Subjective:   [name] is a 72 y.o. female who presents for an Initial Medicare Annual Wellness Visit.  Review of Systems    Defer to pcp        Objective:    Today's Vitals   10/12/22 0843 10/12/22 0916 10/12/22 1151  BP: (!) 148/83 (!) 148/70   Pulse: 82 76   SpO2: 100%    Weight: 132 lb 11.5 oz (60.2 kg)    PainSc:   0-No pain   Body mass index is 20.79 kg/m.     10/12/2022    1:29 PM 10/04/2022    3:26 PM 09/30/2022   11:14 AM 09/16/2022    9:50 AM 09/14/2022    3:14 AM 07/10/2022    7:01 AM 06/10/2022    8:27 AM  Advanced Directives  Does Patient Have a Medical Advance Directive? No No No No No No No  Would patient like information on creating a medical advance directive? No - Patient declined No - Patient declined No - Patient declined No - Patient declined No - Patient declined      Current Medications (verified) Outpatient Encounter Medications as of 10/12/2022  Medication Sig   alendronate (FOSAMAX) 70 MG tablet Take 1 tablet (70 mg total) by mouth every 7 (seven) days. Take with a full glass of water on an empty stomach.   atorvastatin (LIPITOR) 40 MG tablet Take 1 tablet (40 mg total) by mouth daily.   cetirizine (ZYRTEC) 10 MG tablet Take 10 mg by mouth daily.   diclofenac Sodium (VOLTAREN ARTHRITIS PAIN) 1 % GEL Apply 2 g topically 4 (four) times daily.   ezetimibe (ZETIA) 10 MG tablet Take 1 tablet (10 mg total) by mouth daily.   fluticasone (FLONASE) 50 MCG/ACT nasal spray Place 1 spray into both nostrils daily.   fluticasone (FLOVENT HFA) 110 MCG/ACT inhaler Inhale 1 puff into the lungs daily. Inhale 1 puff into the lungs daily.   HYDROcodone-acetaminophen (NORCO/VICODIN) 5-325 MG tablet 1/2 to 1 tablet only when needed for severe pain, as often as every 6 hours. (Patient not taking: Reported on 09/17/2022)   levalbuterol (XOPENEX HFA) 45 MCG/ACT inhaler Inhale 1 puff into the lungs every 6 (six) hours as needed for shortness of breath.   metoprolol  succinate (TOPROL-XL) 50 MG 24 hr tablet Take 1 tablet (50 mg total) by mouth daily. Take with or immediately following a meal.   montelukast (SINGULAIR) 10 MG tablet Take 1 tablet (10 mg total) by mouth at bedtime.   omeprazole (PRILOSEC) 40 MG capsule Take 1 capsule (40 mg total) by mouth in the morning and at bedtime.   polyethylene glycol (MIRALAX / GLYCOLAX) 17 g packet Take 17 g by mouth 2 (two) times daily.   potassium chloride (KLOR-CON M) 10 MEQ tablet Take 1 tablet (10 mEq total) by mouth daily.   rivaroxaban (XARELTO) 20 MG TABS tablet Take 1 tablet (20 mg total) by mouth daily with supper.   senna-docusate (SENNA-S) 8.6-50 MG tablet Take 1 tablet by mouth 2 (two) times daily. Or as directed by physician.   spironolactone (ALDACTONE) 25 MG tablet Take 2 tablets (50 mg total) by mouth daily. For blood pressure.   tiotropium (SPIRIVA) 18 MCG inhalation capsule Place 1 capsule (18 mcg total) into inhaler and inhale daily.   [DISCONTINUED] linaclotide (LINZESS) 72 MCG capsule Take 1 capsule (72 mcg total) by mouth daily before breakfast.   [DISCONTINUED] losartan (COZAAR) 50 MG tablet Take 2 tablets (100 mg total)  by mouth daily.   No facility-administered encounter medications on file as of 10/12/2022.    Allergies (verified) Albuterol, Percocet [oxycodone-acetaminophen], Celecoxib, and Tramadol   History: Past Medical History:  Diagnosis Date   Asthma in adult, mild intermittent, uncomplicated    Chronic anticoagulation    Chronic constipation    COVID-19 virus infection 10/19/2021   Eczema    GERD (gastroesophageal reflux disease)    H. pylori infection 09/24/2021   Identified on EGD biopsy.  Treatment will be discussed with her GI doctor.  SHe will need a very simple regimen which has no contraindications in combination with her existing medication regimen.     Hemoperitoneum 09/27/2021   Due to splenic lac occurring after colonoscopy on 09/24/21.  Xarelto resumed.    History of  kidney stones    Hx of vaginal bleeding, resolved, ultrasound completed 10/11/2018   Hypertension    Laceration of spleen    Presumed complication of colonoscopy 09/24/21.  Pain and hemoperitoneum.  Xarelto resumed.  Short term opioid pain management.     Personal history of atrial fibrillation, s/p ablation, now in NSR but remains on anticoaulation 11/26/2015   Followed in cardiology clinic    Past Surgical History:  Procedure Laterality Date   ATRIAL FIBRILLATION ABLATION N/A 09/30/2017   Procedure: [place];  Surgeon: Thompson Grayer, MD;  Location: [facility];  Service: Cardiovascular;  Laterality: N/A;   BIOPSY  09/24/2021   Procedure: BIOPSY;  Surgeon: Sharyn Creamer, MD;  Location: [facility];  Service: Gastroenterology;;   CHOLECYSTECTOMY N/A 12/08/2018   Procedure: LAPAROSCOPIC CHOLECYSTECTOMY WITH INTRAOPERATIVE CHOLANGIOGRAM;  Surgeon: Jovita Kussmaul, MD;  Location: [facility];  Service: General;  Laterality: N/A;   COLONOSCOPY WITH PROPOFOL N/A 09/24/2021   Procedure: COLONOSCOPY WITH PROPOFOL;  Surgeon: Sharyn Creamer, MD;  Location: [facility];  Service: Gastroenterology;  Laterality: N/A;   ERCP N/A 12/09/2018   Procedure: ENDOSCOPIC RETROGRADE CHOLANGIOPANCREATOGRAPHY (ERCP);  Surgeon: Carol Ada, MD;  Location: [facility];  Service: Endoscopy;  Laterality: N/A;   ESOPHAGOGASTRODUODENOSCOPY (EGD) WITH PROPOFOL N/A 09/24/2021   Procedure: ESOPHAGOGASTRODUODENOSCOPY (EGD) WITH PROPOFOL;  Surgeon: Sharyn Creamer, MD;  Location: [facility];  Service: Gastroenterology;  Laterality: N/A;   POLYPECTOMY  09/24/2021   Procedure: POLYPECTOMY;  Surgeon: Sharyn Creamer, MD;  Location[facility];  Service: Gastroenterology;;   Joan Mayans  12/09/2018   Procedure: Joan Mayans;  Surgeon: Carol Ada, MD;  Location: [facility] ENDOSCOPY;  Service: Endoscopy;;   TONSILLECTOMY     TUBAL LIGATION     Family History  Problem Relation Age of Onset   Heart attack Mother         Young age   Breast cancer Sister        Late 40s; now s/p mastectomy    Lung cancer Brother        x 2   Cancer Sister    Cancer Sister    Cancer Sister    Colon cancer Neg Hx    Colon polyps Neg Hx    Esophageal cancer Neg Hx    Rectal cancer Neg Hx    Stomach cancer Neg Hx    Social History   Socioeconomic History   Marital status: Single    Spouse name: Not on file   Number of children: Not on file   Years of education: Not on file   Highest education level: Not on file  Occupational History   Occupation: Retired  Tobacco Use  Smoking status: Former    Types: Cigarettes    Quit date: 10/24/2015    Years since quitting: 6.9   Smokeless tobacco: Never  Vaping Use   Vaping Use: Never used  Substance and Sexual Activity   Alcohol use: Not Currently   Drug use: Not Currently    Types: Marijuana    Comment: Smoked marijuana in the past.   Sexual activity: Yes    Partners: Male    Birth control/protection: Post-menopausal  Other Topics Concern   Not on file  Social History Narrative   Lives alone in [place], [place] and has 2 daughters who are within driving distance   Enjoys watching soap operas, reading, exercise   Social Determinants of Health   Financial Resource Strain: Low Risk  (10/04/2022)   Overall Financial Resource Strain (CARDIA)    Difficulty of Paying Living Expenses: Not hard at all  Food Insecurity: No Food Insecurity (10/04/2022)   Hunger Vital Sign    Worried About Running Out of Food in the Last Year: Never true    [place] in the Last Year: Never true  Transportation Needs: No Transportation Needs (10/04/2022)   PRAPARE - Hydrologist (Medical): No    Lack of Transportation (Non-Medical): No  Physical Activity: Insufficiently Active (10/04/2022)   Exercise Vital Sign    Days of Exercise per Week: 2 days    Minutes of Exercise per Session: 30 min  Stress: No Stress Concern Present (10/04/2022)   [place]    Feeling of Stress : Not at all  Social Connections: Socially Isolated (10/04/2022)   Social Connection and Isolation Panel [NHANES]    Frequency of Communication with Friends and Family: More than three times a week    Frequency of Social Gatherings with Friends and Family: Three times a week    Attends Religious Services: Never    Active Member of Clubs or Organizations: No    Attends Archivist Meetings: Never    Marital Status: Separated    Tobacco Counseling Counseling given: Not Answered   Clinical Intake:  Pre-visit preparation completed: Yes  Pain : No/denies pain Pain Score: 0-No pain     BMI - recorded: 20.79 Nutritional Status: BMI of 19-24  Normal Nutritional Risks: None  How often do you need to have someone help you when you read instructions, pamphlets, or other written materials from your doctor or pharmacy?: 1 - Never What is the last grade level you completed in school?: 11th grade  Diabetic?No  Interpreter Needed?: No  Information entered by :: [email or web address]   Activities of Daily Living    10/12/2022   11:49 AM 10/12/2022    8:39 AM  In your present state of health, do you have any difficulty performing the following activities:  Hearing? 0 0  Comment  wax now gone  Vision? 0 0  Difficulty concentrating or making decisions? 0 0  Walking or climbing stairs? 0 0  Dressing or bathing? 0 0  Doing errands, shopping? 0 0    Patient Care Team: Angelica Pou, MD as PCP - General (Internal Medicine) Thompson Grayer, MD (Inactive) as PCP - Electrophysiology (Cardiology) Jovita Kussmaul, MD as Consulting Physician (General Surgery)  Indicate any recent Medical Services you may have received from other than [facility] providers in the past year (date may be approximate).     Assessment:   This is a routine  wellness examination for [name].  Hearing/Vision screen No  results found.  Dietary issues and exercise activities discussed:     Goals Addressed   None   Depression Screen    10/12/2022   11:50 AM 10/12/2022    8:39 AM 10/04/2022    3:25 PM 01/21/2022    9:39 AM 10/15/2021    9:54 AM 08/18/2021    8:40 AM 04/16/2021   11:12 AM  PHQ 2/9 Scores  PHQ - 2 Score 1 1 0 0 0 0 0  PHQ- 9 Score 1 1   0 0     Fall Risk    10/12/2022   11:49 AM 10/12/2022    8:38 AM 09/30/2022   11:14 AM 09/16/2022    9:51 AM 01/21/2022    9:01 AM  Fall Risk   Falls in the past year? 0 0 0 0 0  Number falls in past yr: 0 0 0 0 0  Injury with Fall? 0 0 0 0 0  Risk for fall due to : Impaired balance/gait No Fall Risks     Follow up Falls evaluation completed Falls evaluation completed Falls evaluation completed Falls evaluation completed Falls evaluation completed    [place]:  Any stairs in or around the home? No  If so, are there any without handrails? No  Home free of loose throw rugs in walkways, pet beds, electrical cords, etc? Yes  Adequate lighting in your home to reduce risk of falls? Yes   ASSISTIVE DEVICES UTILIZED TO PREVENT FALLS:  Life alert? No  Use of a cane, walker or w/c? No  Grab bars in the bathroom? No  Shower chair or bench in shower? No  Elevated toilet seat or a handicapped toilet? No   TIMED UP AND GO:  Was the test performed? No .  Length of time to ambulate 10 feet: 0 sec.   Gait steady and fast without use of assistive device  Cognitive Function:        10/12/2022   11:50 AM  6CIT Screen  What Year? 0 points  What month? 0 points  What time? 0 points  Count back from 20 0 points  Months in reverse 4 points  Repeat phrase 2 points  Total Score 6 points    Immunizations Immunization History  Administered Date(s) Administered   Fluad Quad(high Dose 65+) 06/11/2019   Influenza,inj,Quad PF,6+ Mos 09/24/2016, 06/17/2017, 07/24/2020   Influenza-Unspecified 05/23/2018   PFIZER(Purple  Top)SARS-COV-2 Vaccination 10/27/2019, 11/26/2019   Pneumococcal Conjugate-13 02/16/2017   Pneumococcal Polysaccharide-23 06/10/2015   Tdap 06/01/2015   Zoster Recombinat (Shingrix) 06/11/2019    TDAP status: Up to date  Flu Vaccine status: Up to date  Pneumococcal vaccine status: Up to date  Covid-19 vaccine status: Completed vaccines  Qualifies for Shingles Vaccine? Yes   Zostavax completed No   Shingrix Completed?: No.    Education has been provided regarding the importance of this vaccine. Patient has been advised to call insurance company to determine out of pocket expense if they have not yet received this vaccine. Advised may also receive vaccine at local pharmacy or Health Dept. Verbalized acceptance and understanding.  Screening Tests Health Maintenance  Topic Date Due   Zoster Vaccines- Shingrix (2 of 2) 08/06/2019   COVID-19 Vaccine (3 - Pfizer risk series) 12/24/2019   INFLUENZA VACCINE  03/23/2022   Medicare Annual Wellness (AWV)  10/13/2023   MAMMOGRAM  03/12/2024   DTaP/Tdap/Td (2 - Td or  Tdap) 05/31/2025   COLONOSCOPY (Pts 45-15yr Insurance coverage will need to be confirmed)  09/24/2026   Pneumonia Vaccine 72 Years old  Completed   DEXA SCAN  Completed   Hepatitis C Screening  Completed   HPV VACCINES  Aged Out    Health Maintenance  Health Maintenance Due  Topic Date Due   Zoster Vaccines- Shingrix (2 of 2) 08/06/2019   COVID-19 Vaccine (3 - Pfizer risk series) 12/24/2019   INFLUENZA VACCINE  03/23/2022    Colorectal cancer screening: Type of screening: Colonoscopy. Completed 09/24/2021. Repeat every 5 years  Mammogram status: Completed 03/12/2022. Repeat every year  Bone Density status: Completed 03/10/2017. 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 Repeat every 2 years.  Lung Cancer Screening: (Low Dose CT Chest recommended if Age 74-80years, 30  pack-year currently smoking OR have quit w/in 15years.) does qualify.   Lung Cancer Screening Referral: defer to pcp  Additional Screening:  Hepatitis C Screening: does not qualify; Completed 06/17/2017  Vision Screening: Recommended annual ophthalmology exams for early detection of glaucoma and other disorders of the eye. Is the patient up to date with their annual eye exam?  No  Who is the provider or what is the name of the office in which the patient attends annual eye exams? "Can't remember" If pt is not established with a provider, would they like to be referred to a provider to establish care? Pt declined ophthalmology referral-sates she will contact her insurance to arrange. .   Dental Screening: Recommended annual dental exams for proper oral hygiene  Community Resource Referral / Chronic Care Management: CRR required this visit?  No   CCM required this visit?  No      Plan:     I have personally reviewed and noted the following in the patient's chart:   Medical and social history Use of alcohol, tobacco or illicit drugs  Current medications and supplements including opioid prescriptions. Patient is currently taking opioid prescriptions. Information provided to patient regarding non-opioid alternatives. Patient advised to discuss non-opioid treatment plan with their provider. Functional ability and status Nutritional status Physical activity Advanced directives List of other physicians Hospitalizations, surgeries, and ER visits in previous 12 months Vitals Screenings to include cognitive, depression, and falls Referrals and appointments  In addition, I have reviewed and discussed with patient certain preventive protocols, quality metrics, and best practice recommendations. A written personalized care plan for preventive services as well as general preventive health recommendations were provided to patient.     [place]  10/12/2022   Nurse  Notes: face to face  [name], Thank you for taking time to come for your Medicare Wellness Visit. I appreciate your ongoing commitment to your health goals. Please review the following plan we discussed and let me know if I can assist you in the future.   These are the goals we discussed:  Goals       MSW Care Coordination (pt-stated)      Patient advised she is in need of her Arthritis medication.  Patient advised the pharmacy usually has the order by now.   SW will send message to care team.         This is a list of the screening recommended for you and due dates:  Health Maintenance  Topic Date Due   Zoster (Shingles) Vaccine (2 of 2) 08/06/2019   COVID-19 Vaccine (3 - [employer]  risk series) 12/24/2019   Flu Shot  03/23/2022   Medicare Annual Wellness Visit  10/13/2023   Mammogram  03/12/2024   DTaP/Tdap/Td vaccine (2 - Td or Tdap) 05/31/2025   Colon Cancer Screening  09/24/2026   Pneumonia Vaccine  Completed   DEXA scan (bone density measurement)  Completed   Hepatitis C Screening: USPSTF Recommendation to screen - Ages 57-79 yo.  Completed   HPV Vaccine  Aged Out

## 2022-10-12 NOTE — Patient Instructions (Signed)

## 2022-10-13 ENCOUNTER — Telehealth: Payer: Self-pay | Admitting: *Deleted

## 2022-10-13 LAB — BMP8+ANION GAP
Anion Gap: 12 mmol/L (ref 10.0–18.0)
BUN/Creatinine Ratio: 15 (ref 12–28)
BUN: 12 mg/dL (ref 8–27)
CO2: 24 mmol/L (ref 20–29)
Calcium: 9.2 mg/dL (ref 8.7–10.3)
Chloride: 107 mmol/L — ABNORMAL HIGH (ref 96–106)
Creatinine, Ser: 0.81 mg/dL (ref 0.57–1.00)
Glucose: 88 mg/dL (ref 70–99)
Potassium: 3.6 mmol/L (ref 3.5–5.2)
Sodium: 143 mmol/L (ref 134–144)
eGFR: 77 mL/min/{1.73_m2} (ref >59)

## 2022-10-13 LAB — LIPID PANEL
Chol/HDL Ratio: 2.6 ratio (ref 0.0–4.4)
Cholesterol, Total: 204 mg/dL — ABNORMAL HIGH (ref 100–199)
HDL: 79 mg/dL (ref >39)
LDL Chol Calc (NIH): 112 mg/dL — ABNORMAL HIGH (ref 0–99)
Triglycerides: 70 mg/dL (ref 0–149)
VLDL Cholesterol Cal: 13 mg/dL (ref 5–40)

## 2022-10-13 NOTE — Telephone Encounter (Signed)
Patient called in asking for lab results. Also, wants to know if she should continue taking potassium. K+ 3.6 drawn yesterday.

## 2022-10-14 NOTE — Assessment & Plan Note (Addendum)
Some improvement in her potassium at 3.6 from 3.3.  Given her history of atrial fibrillation, we will continue the potassium supplementation of 10 mEq for now.  This can be monitored on next outpatient visit and if improved the potassium supplementation can be stopped.

## 2022-10-14 NOTE — Assessment & Plan Note (Signed)
Seen by ENT on 10/05/22 and cerumen removed. Hearing improved. She was given a medrol pack that she finished today.

## 2022-10-14 NOTE — Progress Notes (Signed)
Internal Medicine Clinic Attending  Case discussed with Dr. Humphrey Rolls  At the time of the visit.  We reviewed the resident's history and exam and pertinent patient test results.  I agree with the assessment, diagnosis, and plan of care documented in the resident's note.   We're not making any major medication changes today, but we will try to consolidate some of the dosing. Dr. Humphrey Rolls explained these updates to Ms Schoolman in detail. We will change to Losartan 130m tablet once daily and Spironolactone 592mtablet once daily.

## 2022-10-14 NOTE — Assessment & Plan Note (Signed)
Pt states she is out of lipitor. Last refill was in 05/2022. She is supposed to be on lipitor 40 mg qd. Last lipid panel in 03/2022 with LDL 140. Repeat lipid panel with LDL 112. Will consider this baseline and restart lipitor. Goal is <70 given secondary prevention.

## 2022-10-14 NOTE — Addendum Note (Signed)
Addended by: [name] on: 10/14/2022 10:59 AM   Modules accepted: Level of Service

## 2022-10-14 NOTE — Assessment & Plan Note (Signed)
On Losartan 100 mg qd, Metoprolol XR 50 mg qd, Spironolactone 50 mg qd. BP this visit is 148/83. Pt has hx of being easily confused regarding her medications.  She is currently taking 2 pills of losartan 50 mg and 2 pills of spironolactone 25 mg.  She has a refill on the spironolactone 50 mg.  I instructed her to stop the 50 mg pills of the losartan and pick up the 100 mg of losartan.  This will simplify her regimen and we can consider adding another medication if needed at a subsequent visit.  BMP with normal renal function.

## 2022-10-15 DIAGNOSIS — G629 Polyneuropathy, unspecified: Secondary | ICD-10-CM | POA: Diagnosis not present

## 2022-10-15 DIAGNOSIS — I739 Peripheral vascular disease, unspecified: Secondary | ICD-10-CM | POA: Diagnosis not present

## 2022-10-15 DIAGNOSIS — L851 Acquired keratosis [keratoderma] palmaris et plantaris: Secondary | ICD-10-CM | POA: Diagnosis not present

## 2022-10-18 ENCOUNTER — Other Ambulatory Visit: Payer: Self-pay

## 2022-10-18 MED ORDER — POTASSIUM CHLORIDE CRYS ER 10 MEQ PO TBCR: 10.0000 meq | EXTENDED_RELEASE_TABLET | Freq: Every day | ORAL | 0 refills | Status: DC

## 2022-10-19 ENCOUNTER — Other Ambulatory Visit: Payer: Self-pay

## 2022-10-19 DIAGNOSIS — S36039D Unspecified laceration of spleen, subsequent encounter: Secondary | ICD-10-CM

## 2022-10-19 NOTE — Telephone Encounter (Signed)
Pharmacy requesting a 90 day supply

## 2022-10-19 NOTE — Telephone Encounter (Signed)
Patient called she stated the Voltaren gel is not relieving her pain, patient is requesting a refill for pain medication.

## 2022-10-21 ENCOUNTER — Encounter: Payer: 59 | Admitting: Student

## 2022-10-27 NOTE — Progress Notes (Signed)
Internal Medicine Clinic Attending  Case and documentation of Dr. Humphrey Rolls  at the time of the visit reviewed.  I reviewed the AWV findings.  I agree with the assessment, diagnosis, and plan of care documented in the AWV note.

## 2022-11-04 ENCOUNTER — Other Ambulatory Visit: Payer: Self-pay | Admitting: Student

## 2022-11-04 ENCOUNTER — Other Ambulatory Visit: Payer: Self-pay

## 2022-11-04 DIAGNOSIS — K5909 Other constipation: Secondary | ICD-10-CM

## 2022-11-05 MED ORDER — LINACLOTIDE 145 MCG PO CAPS: 145.0000 ug | ORAL_CAPSULE | Freq: Every day | ORAL | 0 refills | Status: DC

## 2022-11-24 IMAGING — CR DG CHEST 2V
2 series · 2 of 2 positions shown · non-contrast
Comparison: June 15, 2021

CLINICAL DATA: Fluttering heart beat with nausea and vomiting.

EXAM:
CHEST - 2 VIEW

[chest lat]
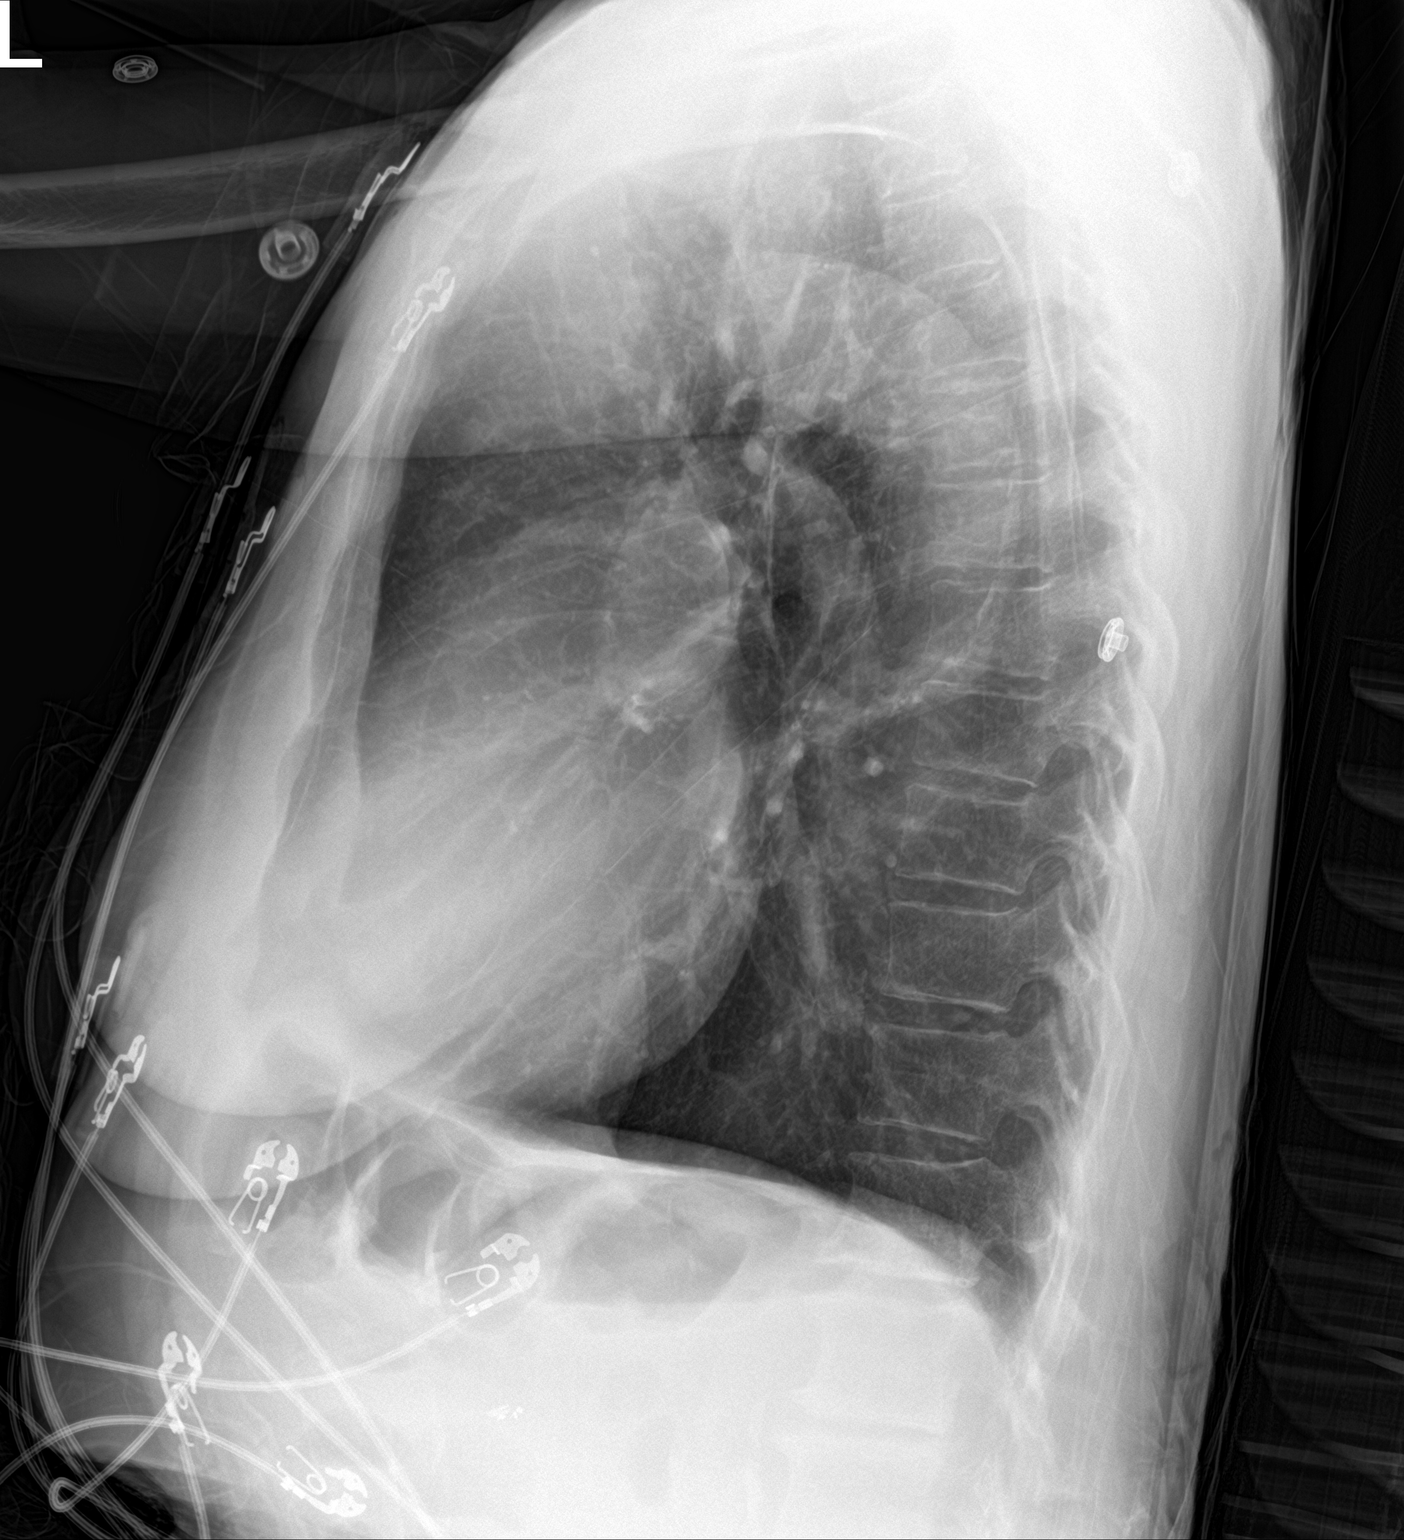

[chest ap]
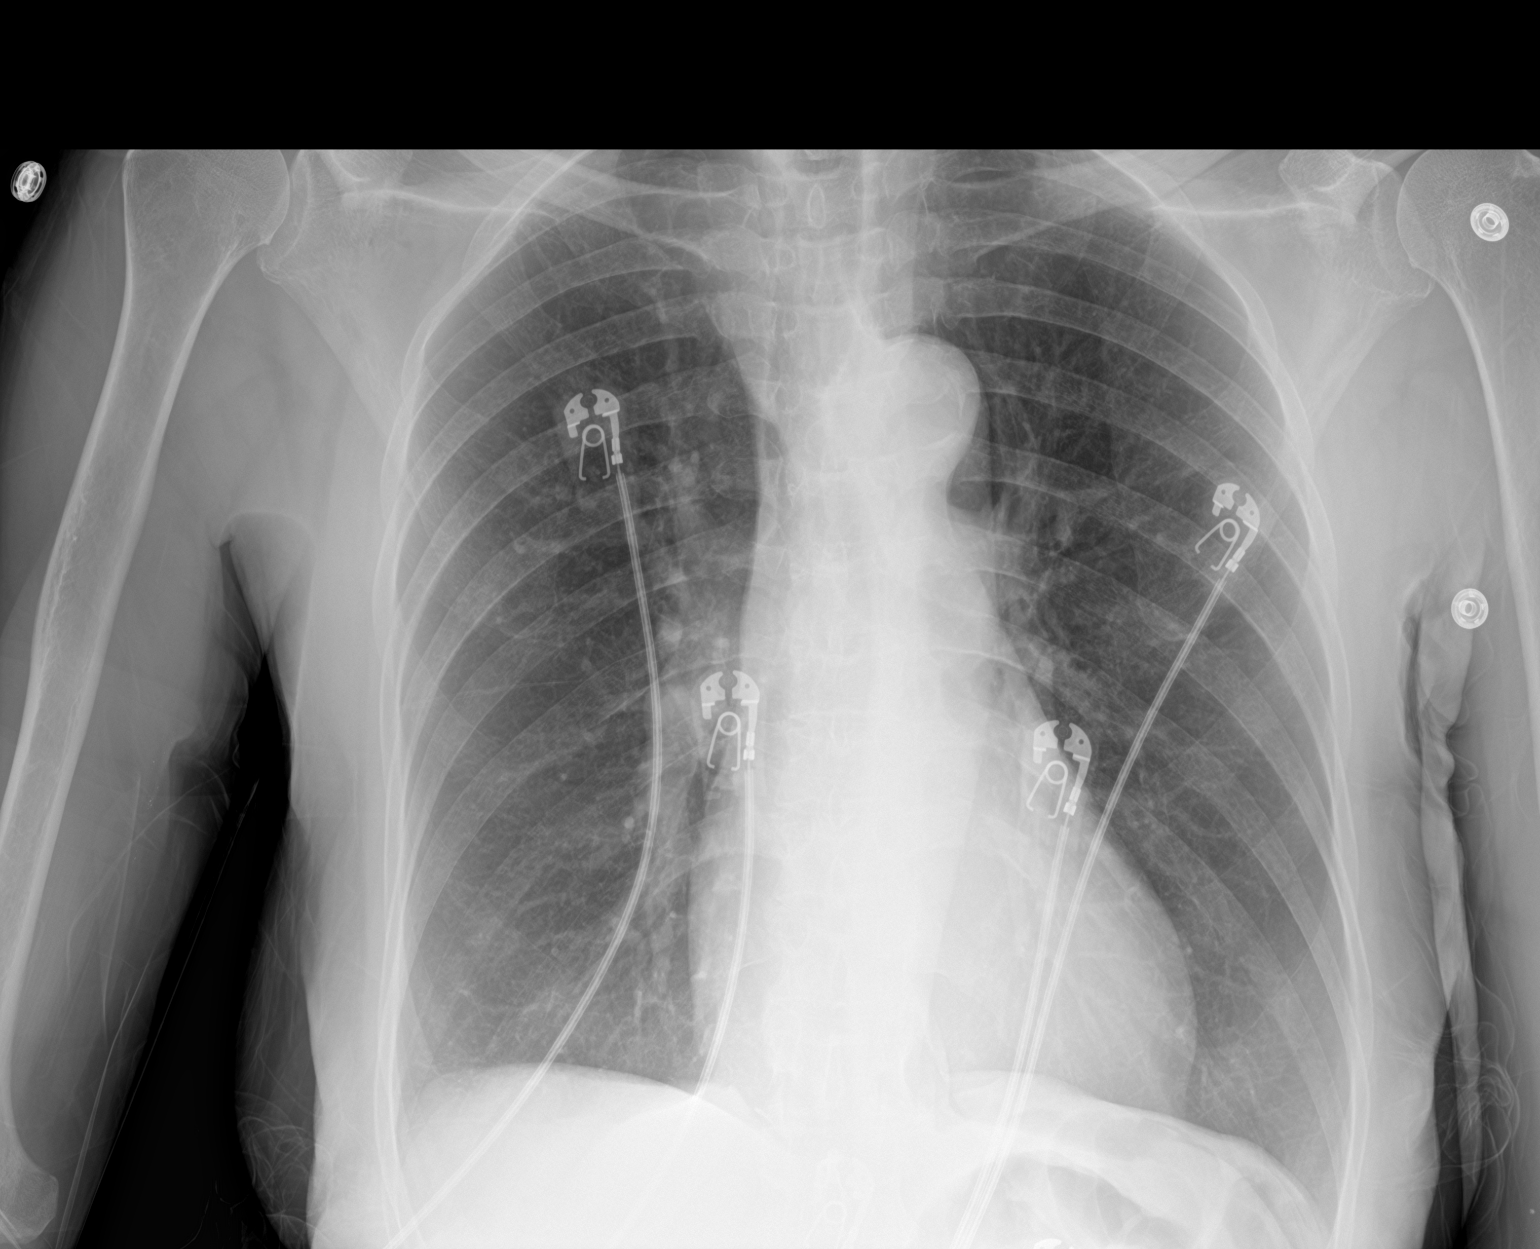

[2 of 2 positions shown; findings below may reference images not displayed]

FINDINGS: The heart size and mediastinal contours are within normal limits.
Both lungs are clear. The visualized skeletal structures are
unremarkable.
IMPRESSION: No active cardiopulmonary disease.

## 2022-11-25 ENCOUNTER — Encounter: Payer: 59 | Admitting: Internal Medicine

## 2022-11-25 IMAGING — CT CT ABD-PELV W/ CM
2 of 5 series · 16 of 46 positions shown, 18 images · IV contrast (omnipaque)
Comparison: April 21, 2021

CLINICAL DATA: Right lower quadrant abdominal pain.

EXAM:
CT ABDOMEN AND PELVIS WITH CONTRAST
TECHNIQUE: Multidetector CT imaging of the abdomen and pelvis was performed
using the standard protocol following bolus administration of
intravenous contrast.
CONTRAST:  100mL OMNIPAQUE IOHEXOL 300 MG/ML  SOLN

[Series 3: a/p w/ 5mm · axial · 0.87mm/px · z∈[-639,-314]mm · 13 of 73 slices shown, 15 images]
[im 4/73  soft-tissue]
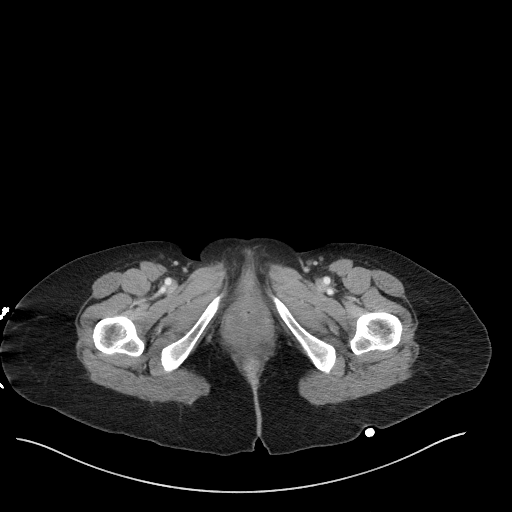
[im 4/73  bone]
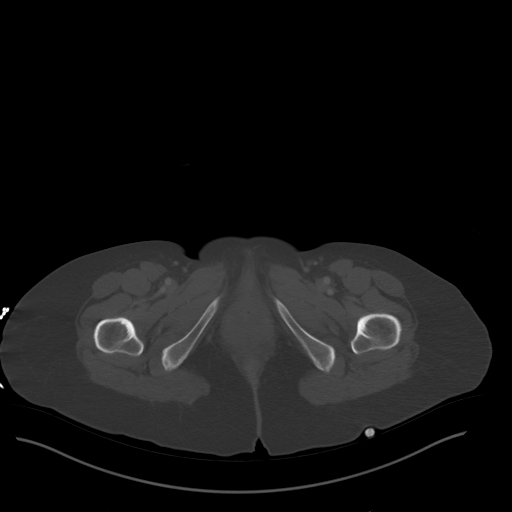
[im 12/73  soft-tissue]
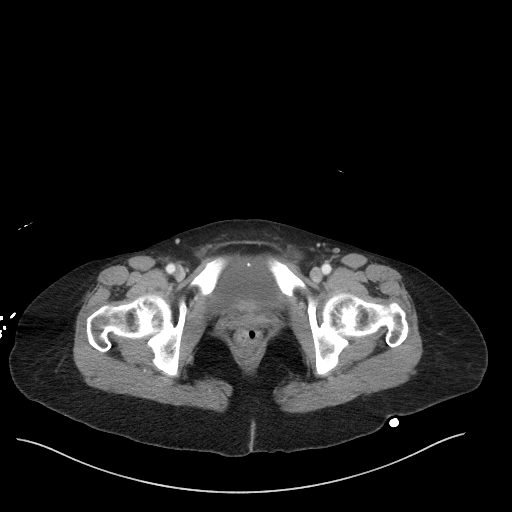
[im 16/73  soft-tissue]
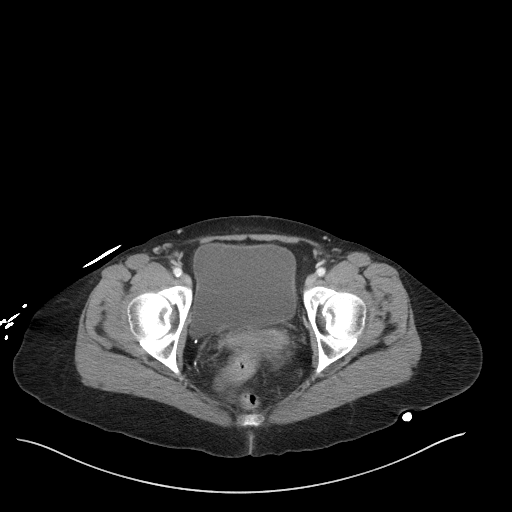
[im 19/73  soft-tissue]
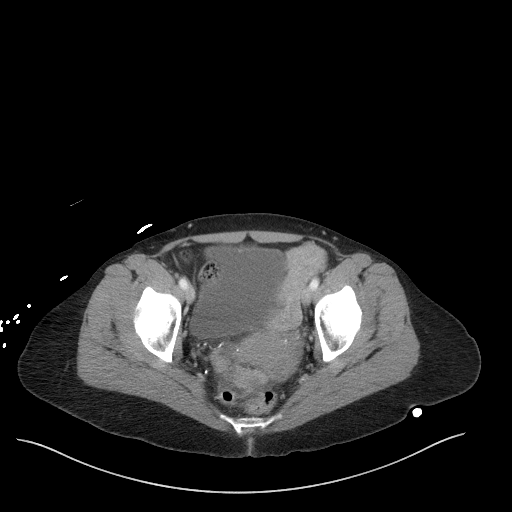
[im 27/73  soft-tissue]
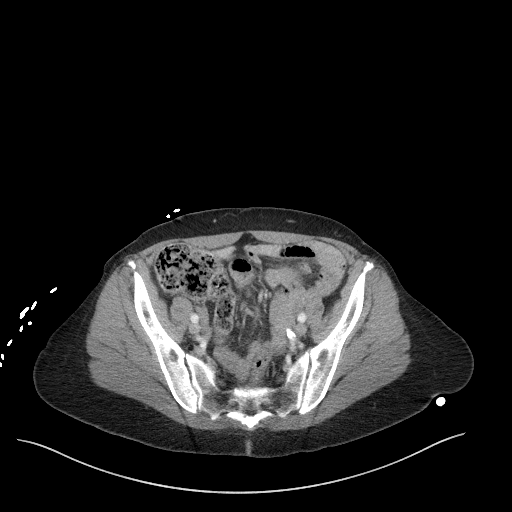
[im 31/73  soft-tissue]
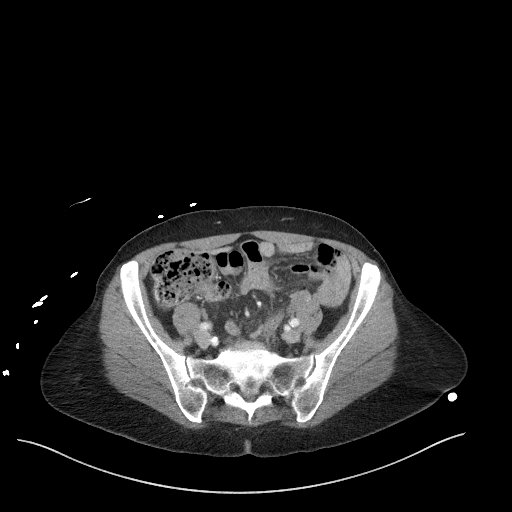
[im 38/73  soft-tissue]
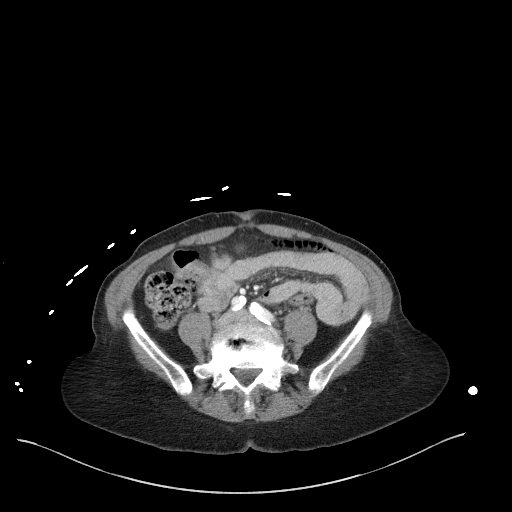
[im 42/73  soft-tissue]
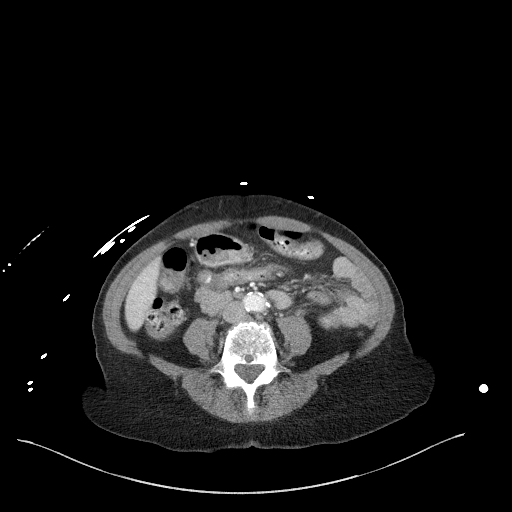
[im 46/73  soft-tissue]
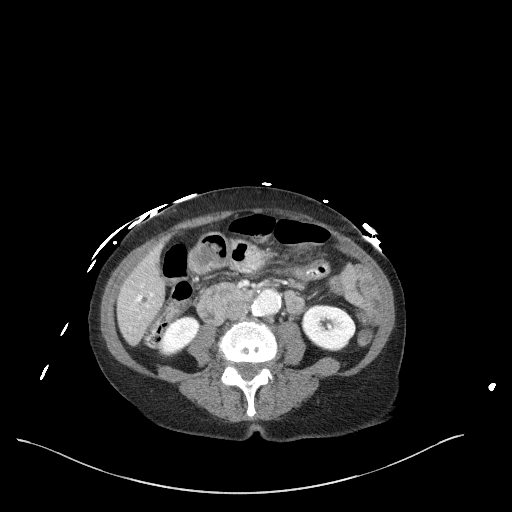
[im 46/73  bone]
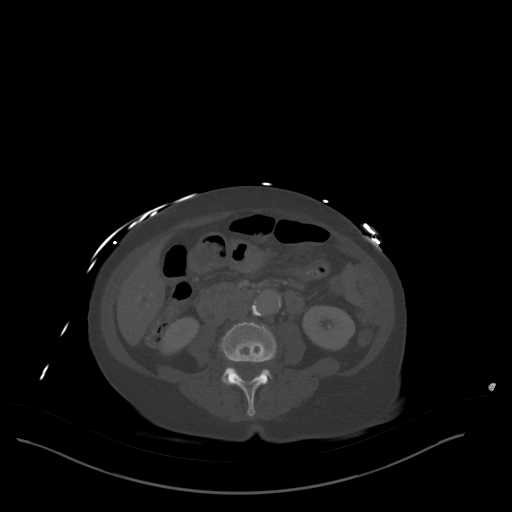
[im 54/73  soft-tissue]
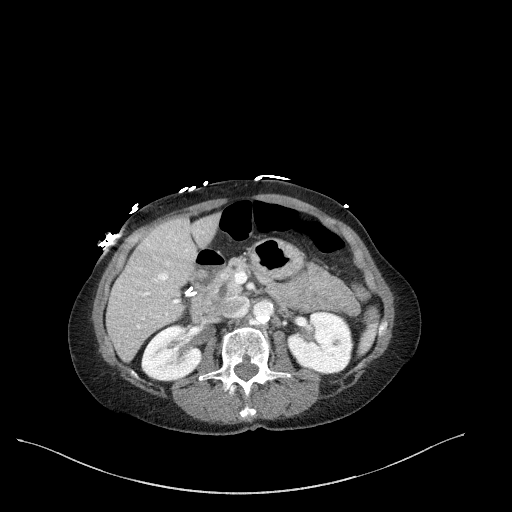
[im 57/73  soft-tissue]
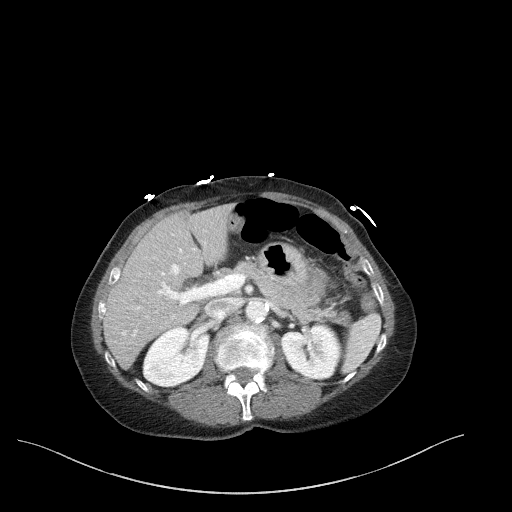
[im 61/73  soft-tissue]
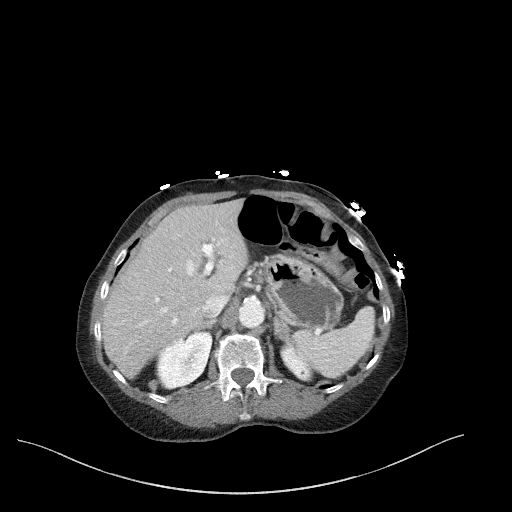
[im 69/73  soft-tissue]
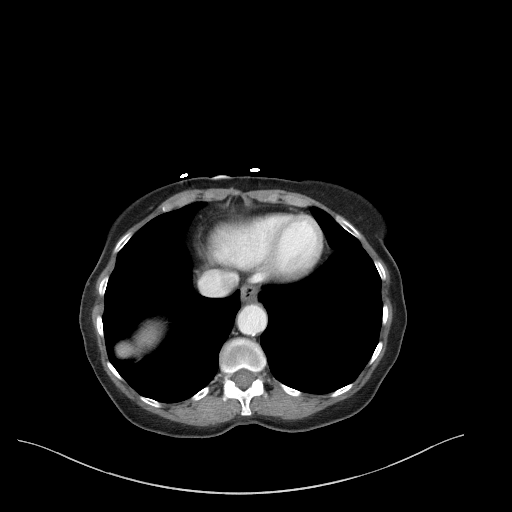

[Series 6: a/p w/ cor · coronal · 0.71mm/px · 3 of 122 slices shown]
[im 41/122  soft-tissue]
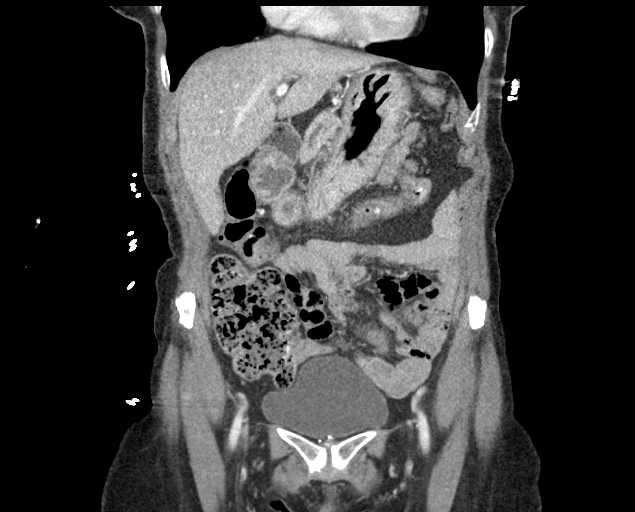
[im 54/122  soft-tissue]
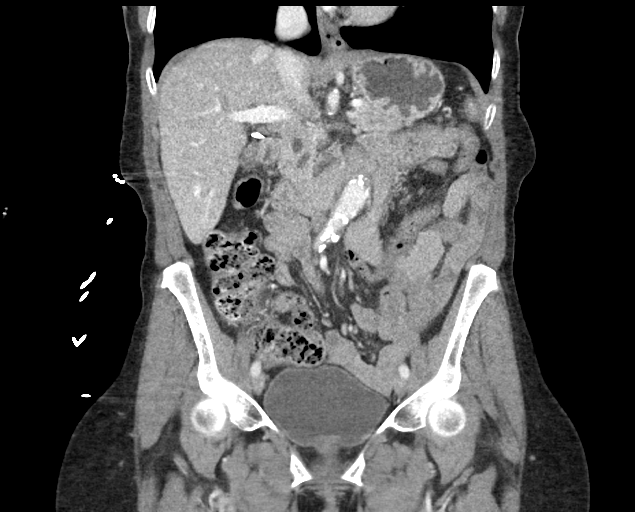
[im 68/122  soft-tissue]
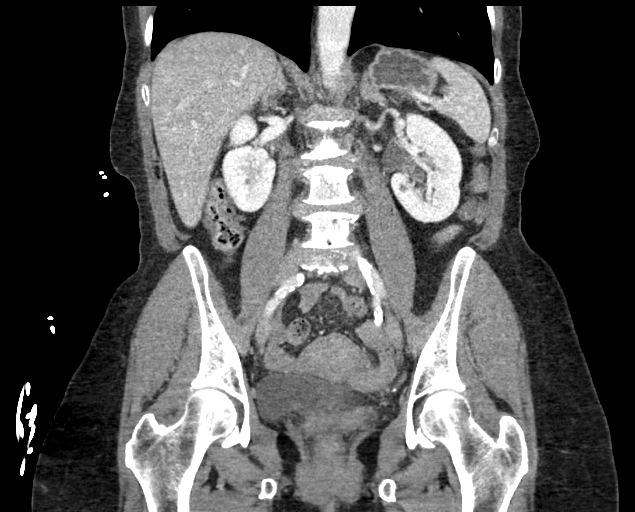

[16 of 46 positions shown; findings below may reference images not displayed]

FINDINGS: Lower chest: No acute abnormality.

Hepatobiliary: Multiple small cystic appearing areas are seen within
the right and left lobes of the liver. The largest is seen within
the anteromedial aspect of the right lobe and measures approximately
1.1 cm in diameter. Status post cholecystectomy. No biliary
dilatation.

Pancreas: Unremarkable. No pancreatic ductal dilatation or
surrounding inflammatory changes.

Spleen: Normal in size without focal abnormality.

Adrenals/Urinary Tract: Adrenal glands are unremarkable. Kidneys are
normal, without renal calculi or hydronephrosis. A 7 mm cystic
appearing area is seen along the medial aspect of the mid left
kidney. Bladder is unremarkable.

Stomach/Bowel: There is a small hiatal hernia. Appendix appears
normal. No evidence of bowel wall thickening, distention, or
inflammatory changes.

Vascular/Lymphatic: Aortic atherosclerosis. No enlarged abdominal or
pelvic lymph nodes.

Reproductive: Uterus and bilateral adnexa are unremarkable.

Other: No abdominal wall hernia or abnormality. No abdominopelvic
ascites.

Musculoskeletal: Multilevel degenerative changes seen throughout the
lumbar spine.
IMPRESSION: 1. Multiple hepatic cysts.
2. Small hiatal hernia.
3. Evidence of prior cholecystectomy.
4. Normal appendix.
5. Aortic atherosclerosis.

Aortic Atherosclerosis ([id number]).

## 2022-12-02 ENCOUNTER — Encounter: Payer: Self-pay | Admitting: Internal Medicine

## 2022-12-02 ENCOUNTER — Ambulatory Visit (INDEPENDENT_AMBULATORY_CARE_PROVIDER_SITE_OTHER): Payer: 59 | Admitting: Internal Medicine

## 2022-12-02 VITALS — BP 161/85 | HR 54 | Temp 97.8°F | Ht 67.0 in | Wt 140.7 lb

## 2022-12-02 DIAGNOSIS — I10 Essential (primary) hypertension: Secondary | ICD-10-CM | POA: Diagnosis not present

## 2022-12-02 DIAGNOSIS — S36039D Unspecified laceration of spleen, subsequent encounter: Secondary | ICD-10-CM

## 2022-12-02 DIAGNOSIS — K5909 Other constipation: Secondary | ICD-10-CM

## 2022-12-02 DIAGNOSIS — M810 Age-related osteoporosis without current pathological fracture: Secondary | ICD-10-CM | POA: Diagnosis not present

## 2022-12-02 DIAGNOSIS — M1711 Unilateral primary osteoarthritis, right knee: Secondary | ICD-10-CM | POA: Diagnosis not present

## 2022-12-02 DIAGNOSIS — M159 Polyosteoarthritis, unspecified: Secondary | ICD-10-CM

## 2022-12-02 MED ORDER — HYDROCODONE-ACETAMINOPHEN 5-325 MG PO TABS
ORAL_TABLET | ORAL | 0 refills | Status: DC
Start: 2022-12-02 — End: 2023-06-04

## 2022-12-02 MED ORDER — SPIRONOLACTONE 25 MG PO TABS
50.0000 mg | ORAL_TABLET | Freq: Every day | ORAL | 11 refills | Status: DC
Start: 2022-12-02 — End: 2023-11-23

## 2022-12-02 NOTE — Assessment & Plan Note (Addendum)
Check vitamin D today.  Remains adherent to bisphosphonate.  No falls.  No fractures.  Continue therapy.

## 2022-12-02 NOTE — Assessment & Plan Note (Signed)
L knee most painful.  There is mild effusion, tenderness over L>L joint line, no tenderness over patella tendon or lateral ligaments.  She feels discomfort in back of knee as well but there is no fullness.  We acetaminophen is her first line, though periodically she takes hydrocodone to remain mobile.  I introduced the idea of a steroid injection and she will think about it.  Voltaren gel continues.

## 2022-12-02 NOTE — Assessment & Plan Note (Signed)
Well-controlled on Linzess 145 mg alone.  She has not needed senna or MiraLAX.  We discussed the risk of recurrent constipation if she is to take her hydrocodone again.  She plans to take a half a dose and only sporadically.  She will notify us if she develops new bowel problems at which time she will probably need to resume her senna S and or MiraLAX.  She is easily confused about these medicines, so care will need to be taken to describe plan and probably written instructions provided.

## 2022-12-02 NOTE — Assessment & Plan Note (Addendum)
161/85 *did not take BP meds this am!  She recognizes and endorses taking everything that is prescribed except the spironolactone, which she does not remember picking up.  She is certain that she has not been taking it.  I have refilled her Spiro 50 mg daily. She will need a nurse and lab only visit for bp recheck about 2-3 weeks after starting medication.  I anticipate that she will need to stop her potassium pill, but will review today's electrolytes before giving her instructions.

## 2022-12-02 NOTE — Progress Notes (Signed)
72 year old [name] is here for routine 74-month follow-up, my last visit with her being 09/30/2022.  Since that time she has been evaluated in our clinic for cerumen impaction and again for ear pain.  These problems have resolved.  At her most recent visit with our resident physician there were some changes made in her blood pressure medicines with goal of enhancing simplicity.  [name] is doing well!  This is the best I have seen her in a long time.  She is experiencing no bowel problems, painful gas or constipation for the first time in recent memory.  This is probably due to cessation of her hydrocodone for arthritis pain (back, left knee) and she is hoping to reintroduce low an occasional dose to help her maintain mobility when Tylenol are inadequate.  Bowels move well with Linzess 145 mg daily.  Medications reviewed today.  She brought some but not all of her prescriptions. Doesn't recall spironolactone - hasn't been taking.  Reorder   BP (!) 161/85 *did not take BP meds this am (BP Location: Left Arm, Patient Position: Sitting, Cuff Size: Small)   Pulse (!) 54   Temp 97.8 F (36.6 C) (Oral)   Ht 5\' 7"  (1.702 m)   Wt 140 lb 11.2 oz (63.8 kg)   SpO2 100%   BMI 22.04 kg/m  Animated, healthy weight. Problem based exam as noted below.   Assessment and plan (problems not ordered by priority)  Hypertension 161/85 *did not take BP meds this am!  She recognizes and endorses taking everything that is prescribed except the spironolactone, which she does not remember picking up.  She is certain that she has not been taking it.  I have refilled her Spiro 50 mg daily. She will need a nurse and lab only visit for bp recheck about 2-3 weeks after starting medication.  I anticipate that she will need to stop her potassium pill, but will review today's electrolytes before giving her instructions.  Chronic constipation Well-controlled on Linzess 145 mg alone.  She has not needed senna or MiraLAX.  We  discussed the risk of recurrent constipation if she is to take her hydrocodone again.  She plans to take a half a dose and only sporadically.  She will notify us if she develops new bowel problems at which time she will probably need to resume her senna S and or MiraLAX.  She is easily confused about these medicines, so care will need to be taken to describe plan and probably written instructions provided.  Osteoporosis without current pathological fracture Check vitamin D today.  Remains adherent to bisphosphonate.  No falls.  No fractures.  Continue therapy.   Primary osteoarthritis involving multiple joints, primarily L knee L knee most painful.  There is mild effusion, tenderness over L>L joint line, no tenderness over patella tendon or lateral ligaments.  She feels discomfort in back of knee as well but there is no fullness.  We acetaminophen is her first line, though periodically she takes hydrocodone to remain mobile.  I introduced the idea of a steroid injection and she will think about it.  Voltaren gel continues.

## 2022-12-03 ENCOUNTER — Encounter: Payer: Self-pay | Admitting: Internal Medicine

## 2022-12-03 ENCOUNTER — Other Ambulatory Visit: Payer: Self-pay | Admitting: Internal Medicine

## 2022-12-03 DIAGNOSIS — E559 Vitamin D deficiency, unspecified: Secondary | ICD-10-CM | POA: Insufficient documentation

## 2022-12-03 DIAGNOSIS — Z8639 Personal history of other endocrine, nutritional and metabolic disease: Secondary | ICD-10-CM

## 2022-12-03 DIAGNOSIS — I10 Essential (primary) hypertension: Secondary | ICD-10-CM

## 2022-12-03 LAB — BMP8+ANION GAP
Anion Gap: 12 mmol/L (ref 10.0–18.0)
BUN/Creatinine Ratio: 17 (ref 12–28)
BUN: 11 mg/dL (ref 8–27)
CO2: 21 mmol/L (ref 20–29)
Calcium: 9.2 mg/dL (ref 8.7–10.3)
Chloride: 108 mmol/L — ABNORMAL HIGH (ref 96–106)
Creatinine, Ser: 0.63 mg/dL (ref 0.57–1.00)
Glucose: 85 mg/dL (ref 70–99)
Potassium: 3.8 mmol/L (ref 3.5–5.2)
Sodium: 141 mmol/L (ref 134–144)
eGFR: 94 mL/min/{1.73_m2} (ref >59)

## 2022-12-03 LAB — VITAMIN D 25 HYDROXY (VIT D DEFICIENCY, FRACTURES): Vit D, 25-Hydroxy: 18.7 ng/mL — ABNORMAL LOW (ref 30.0–100.0)

## 2022-12-15 IMAGING — CT CT ABD-PELV W/ CM
2 of 5 series · 16 of 46 positions shown, 18 images · IV contrast (omnipaque)
Comparison: CT Abdomen and Pelvis 08/09/2021 and earlier.

CLINICAL DATA: 70-year-old female with abdominal pain and
constipation.

EXAM:
CT ABDOMEN AND PELVIS WITH CONTRAST
TECHNIQUE: Multidetector CT imaging of the abdomen and pelvis was performed
using the standard protocol following bolus administration of
intravenous contrast.
CONTRAST:  100mL OMNIPAQUE IOHEXOL 300 MG/ML  SOLN

[Series 3: a/p w/ 5mm · axial · 0.62mm/px · z∈[-999,-644]mm · 13 of 81 slices shown, 15 images]
[im 5/81  soft-tissue]
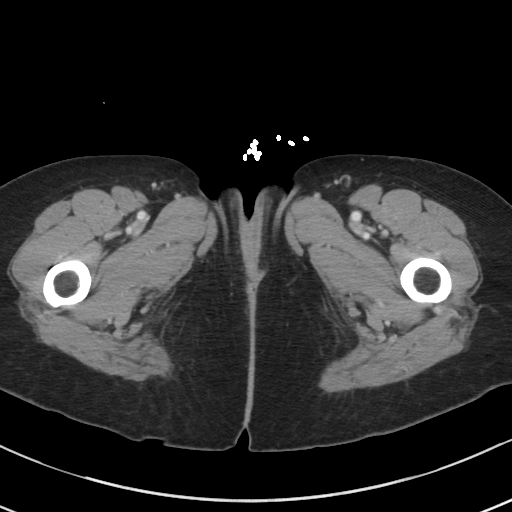
[im 5/81  bone]
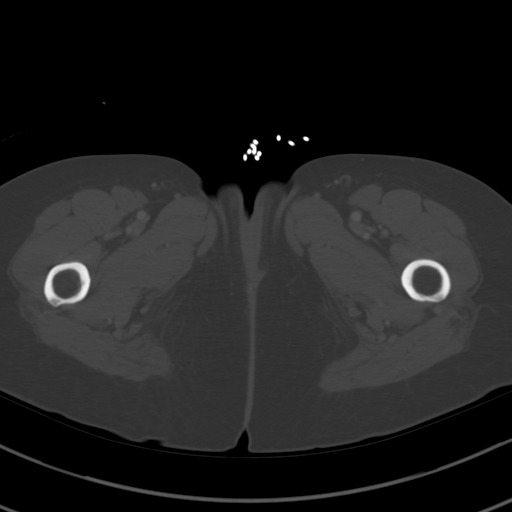
[im 13/81  soft-tissue]
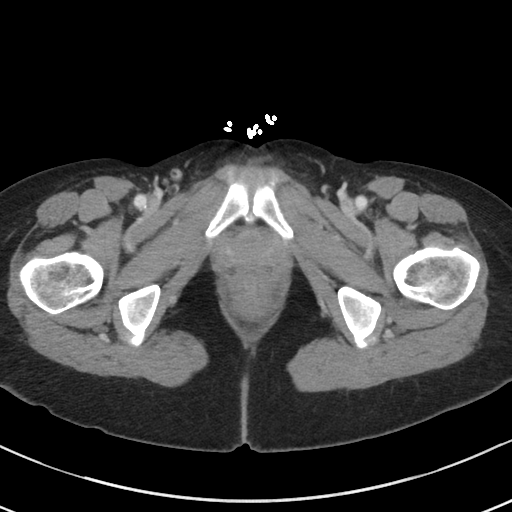
[im 17/81  soft-tissue]
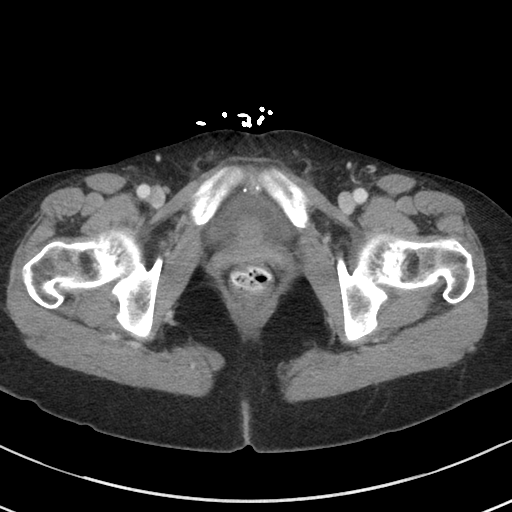
[im 22/81  soft-tissue]
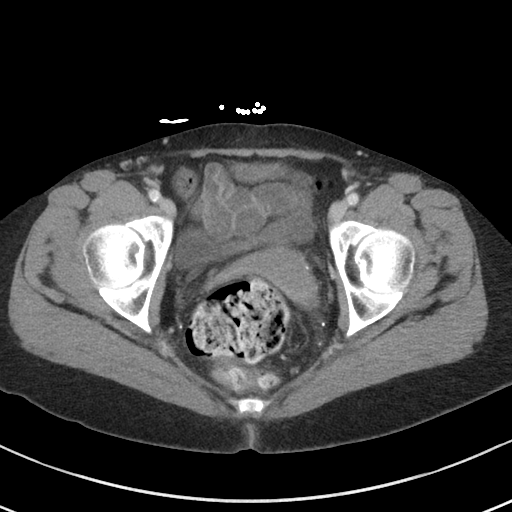
[im 30/81  soft-tissue]
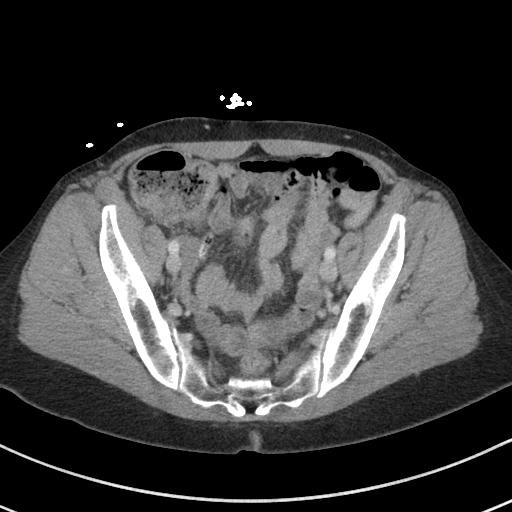
[im 34/81  soft-tissue]
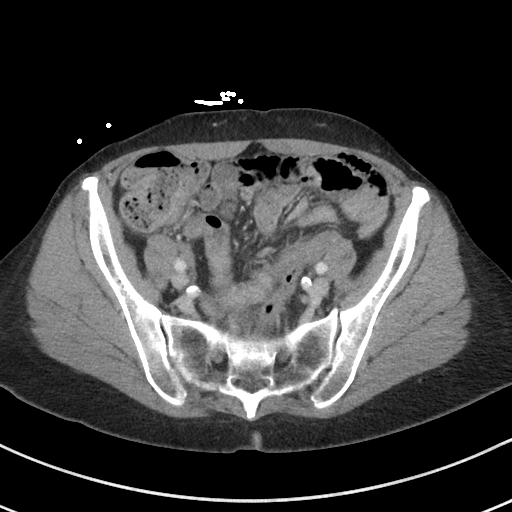
[im 43/81  soft-tissue]
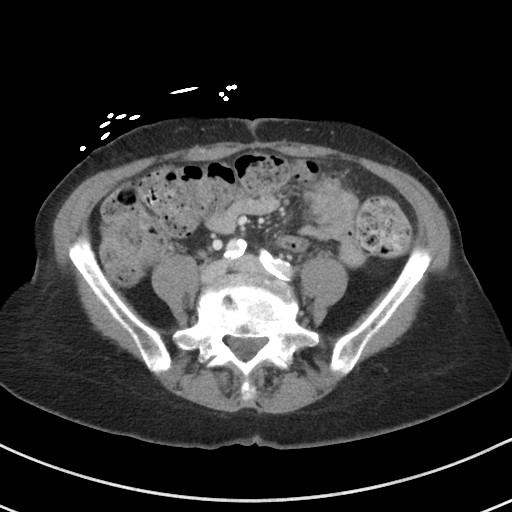
[im 47/81  soft-tissue]
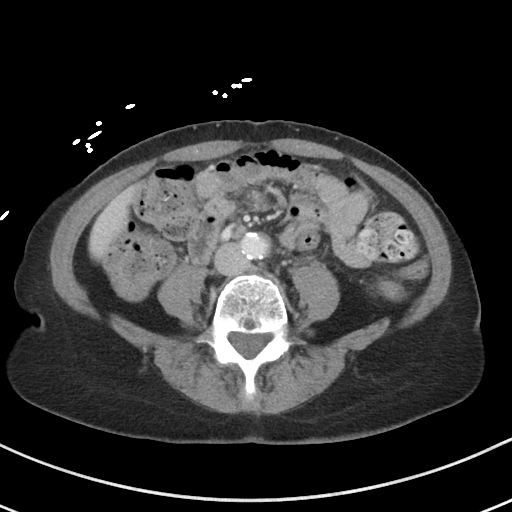
[im 51/81  soft-tissue]
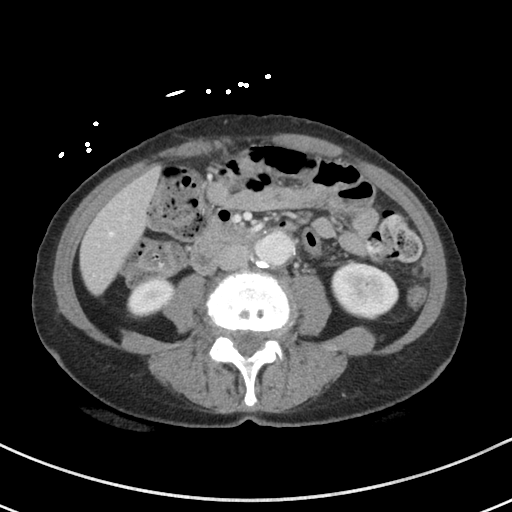
[im 51/81  bone]
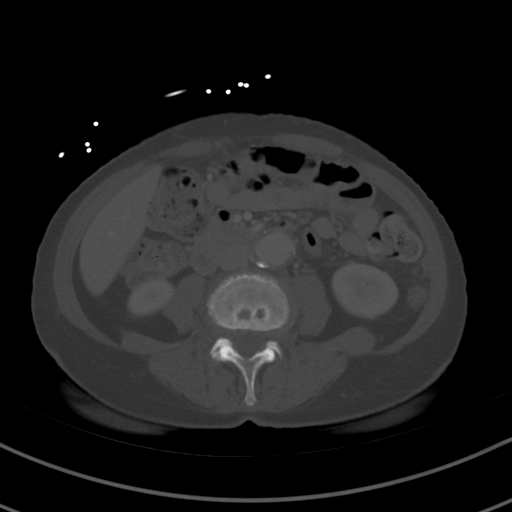
[im 59/81  soft-tissue]
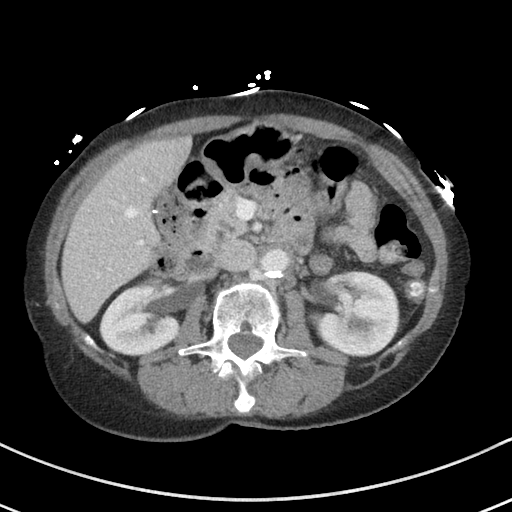
[im 64/81  soft-tissue]
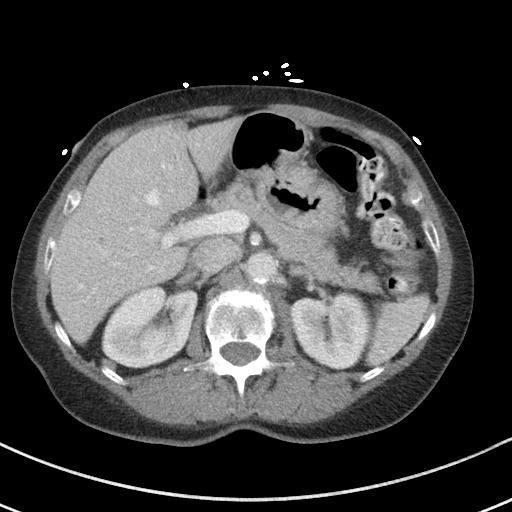
[im 68/81  soft-tissue]
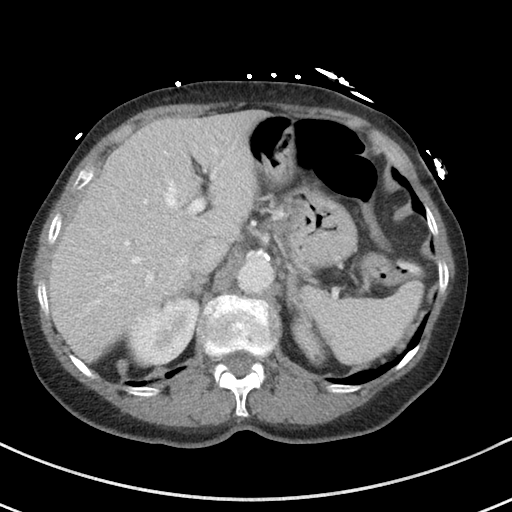
[im 76/81  soft-tissue]
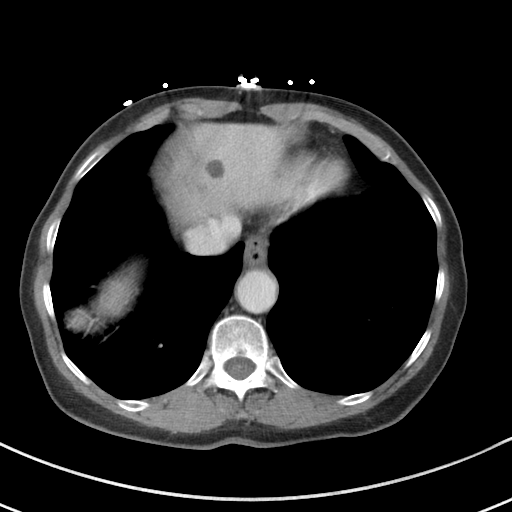

[Series 6: a/p w/ cor · coronal · 0.67mm/px · 3 of 113 slices shown]
[im 38/113  soft-tissue]
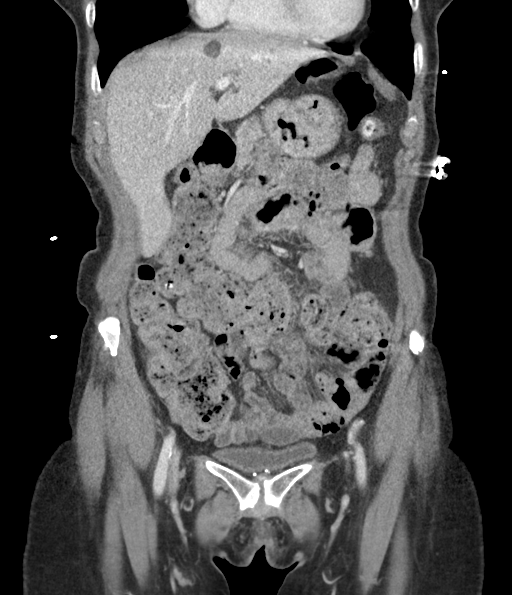
[im 50/113  soft-tissue]
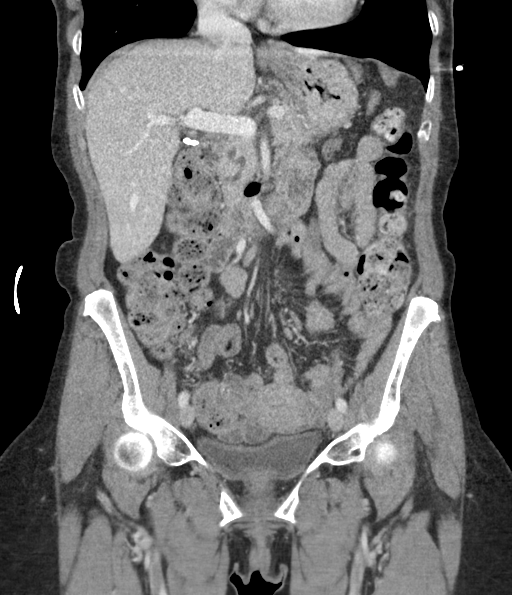
[im 63/113  soft-tissue]
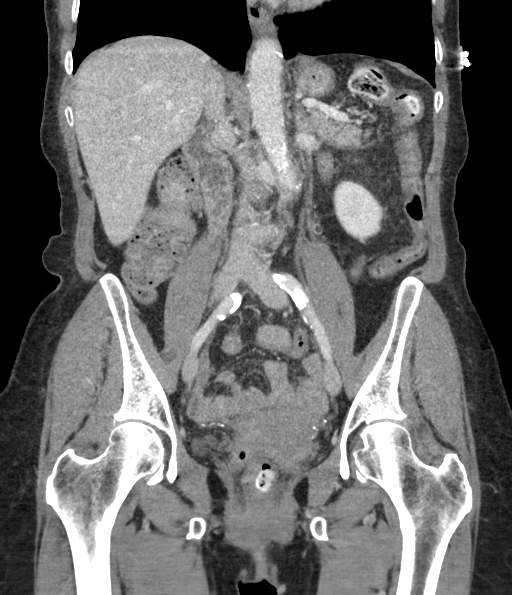

[16 of 46 positions shown; findings below may reference images not displayed]

FINDINGS: Lower chest: Negative.

Hepatobiliary: Unchanged hepatic cysts since at least 3760.
Surgically absent gallbladder. Subtle linear hypoenhancement in the
right hepatic lobe on series 3, image 25 is stable since at least
Naseem Fleites year and normalizes on the delayed imaging as before.

Pancreas: Negative.

Spleen: Negative.

Adrenals/Urinary Tract: Normal adrenal glands. Bilateral extrarenal
pelves suspected (normal variant) with symmetric renal enhancement
and contrast excretion. Ureters appear decompressed. Diminutive
bladder. Multiple pelvic phleboliths but no convincing urinary
calculus or obstructive uropathy.

Stomach/Bowel: Retained stool in the rectum, but upstream sigmoid
and descending colon are decompressed. Redundant splenic flexure and
transverse colon with retained stool throughout the transverse
segment, hepatic flexure and right colon. Cecum partially located in
the pelvis. Normal appendix on coronal image 57.

Negative terminal ileum and no dilated small bowel. Stomach and
duodenum appear negative. No free air or free fluid.

Vascular/Lymphatic: Extensive Aortoiliac calcified atherosclerosis.
Tortuous abdominal aorta. No aneurysmal enlargement. Major arterial
structures appear to remain patent. Portal venous system is patent.
No lymphadenopathy identified.

Reproductive: Within normal limits.

Other: No pelvic free fluid.

Musculoskeletal: Chronic lumbar endplate degeneration and Schmorl's
nodes. Chronic vacuum disc at L4-L5. No acute osseous abnormality
identified.
IMPRESSION: 1. No acute or inflammatory process identified in the abdomen or
pelvis.
2. Redundant large bowel. Retained stool in the rectum, and also in
the right through transverse colon. But decompressed descending and
sigmoid segments.
3. Aortic Atherosclerosis ([id number].F).

## 2022-12-21 ENCOUNTER — Other Ambulatory Visit: Payer: Self-pay | Admitting: Physician Assistant

## 2022-12-22 ENCOUNTER — Telehealth (HOSPITAL_COMMUNITY): Payer: Self-pay

## 2022-12-22 NOTE — Telephone Encounter (Signed)
Patient call to say she missed last night dose of Xarleto and wanted to know if she could take it this morning. Informed her that its a missed dose and just make sure she takes tonight dose.

## 2023-01-05 ENCOUNTER — Telehealth: Payer: Self-pay

## 2023-01-05 ENCOUNTER — Other Ambulatory Visit: Payer: Self-pay | Admitting: Internal Medicine

## 2023-01-05 DIAGNOSIS — Z8639 Personal history of other endocrine, nutritional and metabolic disease: Secondary | ICD-10-CM

## 2023-01-05 NOTE — Telephone Encounter (Signed)
Returned pt's call - no answer; left message to call the office about her concerns.

## 2023-01-05 NOTE — Telephone Encounter (Signed)
Requesting to speak with a nurse about getting refill on pain medication. Please call pt back.

## 2023-01-06 ENCOUNTER — Other Ambulatory Visit: Payer: 59

## 2023-01-06 ENCOUNTER — Ambulatory Visit (HOSPITAL_COMMUNITY)
Admission: RE | Admit: 2023-01-06 | Discharge: 2023-01-06 | Disposition: A | Discharge status: Home / Self Care | Payer: 59 | Source: Ambulatory Visit | Attending: Internal Medicine | Admitting: Internal Medicine

## 2023-01-06 ENCOUNTER — Ambulatory Visit (INDEPENDENT_AMBULATORY_CARE_PROVIDER_SITE_OTHER): Payer: 59 | Admitting: Student

## 2023-01-06 ENCOUNTER — Encounter: Payer: Self-pay | Admitting: Student

## 2023-01-06 VITALS — BP 167/90 | HR 58 | Temp 98.4°F | Ht 67.0 in | Wt 139.1 lb

## 2023-01-06 DIAGNOSIS — M810 Age-related osteoporosis without current pathological fracture: Secondary | ICD-10-CM

## 2023-01-06 DIAGNOSIS — I10 Essential (primary) hypertension: Secondary | ICD-10-CM | POA: Diagnosis not present

## 2023-01-06 DIAGNOSIS — R079 Chest pain, unspecified: Secondary | ICD-10-CM | POA: Diagnosis not present

## 2023-01-06 DIAGNOSIS — J988 Other specified respiratory disorders: Secondary | ICD-10-CM | POA: Diagnosis not present

## 2023-01-06 DIAGNOSIS — R0789 Other chest pain: Secondary | ICD-10-CM

## 2023-01-06 MED ORDER — GUAIFENESIN ER 600 MG PO TB12
600.0000 mg | ORAL_TABLET | Freq: Two times a day (BID) | ORAL | 0 refills | Status: AC | PRN
Start: 2023-01-06 — End: 2023-01-11

## 2023-01-06 MED ORDER — LOSARTAN POTASSIUM 100 MG PO TABS
100.0000 mg | ORAL_TABLET | Freq: Every day | ORAL | 3 refills | Status: DC
Start: 2023-01-06 — End: 2023-01-18

## 2023-01-06 MED ORDER — ALENDRONATE SODIUM 70 MG PO TABS
70.0000 mg | ORAL_TABLET | ORAL | 3 refills | Status: DC
Start: 2023-01-06 — End: 2023-12-26

## 2023-01-06 NOTE — Assessment & Plan Note (Signed)
Blood pressure was elevated at 169/90 after she has taking her medications this morning.  Patient has had issue with medication adherence due to polypharmacy.  However it is very difficult to adjust her dosage when she is not taking medication consistently.  We went over her regimen again today and patient agrees to continue taking her medication as instructed.  I would not adjust her regimen today and set up a follow-up appointment in 4 weeks with her PCP.  If her blood pressure remains uncontrolled after confirmed adherence, can consider workup for renal artery stenosis or OSA.

## 2023-01-06 NOTE — Patient Instructions (Signed)
Mr. [name],  It was nice seeing in the clinic.   Your chest pain is not heart related. Please take Aspirin 81 daily.   Please follow up with Dr. [name] in 1 month

## 2023-01-06 NOTE — Assessment & Plan Note (Addendum)
Patient reports central chest pain that started few days ago after she took her blood pressure medications in the morning.  She states pain is nagging without any radiation.  She denies any associated shortness of breath, palpitation or acid reflux.  Pain came on when she was sitting and only lasted a few seconds.  She denies any chest pain or shortness of breath with exertion.  There is no reproducible pain with chest wall tenderness.  She denies any wheezing or coughing recently.  Today she also took her medication this morning but was pain-free.  Her physical exam is unremarkable.  No signs of volume overload or increased work of breathing.  No reproducible pain with chest wall palpation.  I believe this is a noncardiac chest pain without any typical symptoms.  She has a normal echocardiogram in 2019 with unremarkable CTA coronary.  EKG obtained in the clinic was unremarkable.  Patient is very anxious about taking her medication but I reassured her that this is unlikely cardiac related.  She does have evidence of abdominal aortic atherosclerosis that I advised her to take a 81 mg aspirin if she agrees.  Will continue to maximize her cardiovascular risk of controlling her blood pressure and cholesterol.

## 2023-01-06 NOTE — Progress Notes (Signed)
CC: chest pain  HPI:  [name] is a 72 y.o. living with hypertension, asthma, hyperlipidemia, A-fib on Xarelto, who presents to the clinic for evaluation of her chest pain.  Please see problem based charting for detail  Past Medical History:  Diagnosis Date   Asthma in adult, mild intermittent, uncomplicated    Chronic anticoagulation    Chronic constipation    COVID-19 virus infection 10/19/2021   Eczema    GERD (gastroesophageal reflux disease)    H. pylori infection 09/24/2021   Identified on EGD biopsy.  Treatment will be discussed with her GI doctor.  SHe will need a very simple regimen which has no contraindications in combination with her existing medication regimen.     Hemoperitoneum 09/27/2021   Due to splenic lac occurring after colonoscopy on 09/24/21.  Xarelto resumed.    History of kidney stones    Hx of vaginal bleeding, resolved, ultrasound completed 10/11/2018   Hypertension    Laceration of spleen    Presumed complication of colonoscopy 09/24/21.  Pain and hemoperitoneum.  Xarelto resumed.  Short term opioid pain management.     Personal history of atrial fibrillation, s/p ablation, now in NSR but remains on anticoaulation 11/26/2015   Followed in cardiology clinic    Review of Systems:  per HPI  Physical Exam:  Vitals:   01/06/23 1035 01/06/23 1046  BP: (!) 181/78 (!) 167/90  Pulse: 62 (!) 58  Temp: 98.4 F (36.9 C)   TempSrc: Oral   SpO2: 99%   Weight: 139 lb 1.6 oz (63.1 kg)   Height: 5\' 7"  (1.702 m)    Physical Exam Constitutional:      General: She is not in acute distress.    Appearance: She is not ill-appearing.  HENT:     Head: Normocephalic.  Eyes:     General:        Right eye: No discharge.        Left eye: No discharge.     Conjunctiva/sclera: Conjunctivae normal.  Cardiovascular:     Rate and Rhythm: Normal rate. Rhythm irregular.     Comments: No LE edema.  No JVD Pulmonary:     Effort: Pulmonary effort is normal.      Breath sounds: Normal breath sounds.  Chest:     Chest wall: No tenderness.  Abdominal:     General: Bowel sounds are normal. There is no distension.     Palpations: Abdomen is soft.     Tenderness: There is no abdominal tenderness. There is no guarding or rebound.  Musculoskeletal:        General: Normal range of motion.     Cervical back: Normal range of motion.  Neurological:     Mental Status: She is alert. Mental status is at baseline.  Psychiatric:        Mood and Affect: Mood normal.      Assessment & Plan:   See Encounters Tab for problem based charting.  Non-cardiac chest pain Patient reports central chest pain that started few days ago after she took her blood pressure medications in the morning.  She states pain is nagging without any radiation.  She denies any associated shortness of breath, palpitation or acid reflux.  Pain came on when she was sitting and only lasted a few seconds.  She denies any chest pain or shortness of breath with exertion.  There is no reproducible pain with chest wall tenderness.  She denies any wheezing or coughing  recently.  Today she also took her medication this morning but was pain-free.  Her physical exam is unremarkable.  No signs of volume overload or increased work of breathing.  No reproducible pain with chest wall palpation.  I believe this is a noncardiac chest pain without any typical symptoms.  She has a normal echocardiogram in 2019 with unremarkable CTA coronary.  EKG obtained in the clinic was unremarkable.  Patient is very anxious about taking her medication but I reassured her that this is unlikely cardiac related.  She does have evidence of abdominal aortic atherosclerosis that I advised her to take a 81 mg aspirin if she agrees.  Will continue to maximize her cardiovascular risk of controlling her blood pressure and cholesterol.  Hypertension Blood pressure was elevated at 169/90 after she has taking her medications this morning.   Patient has had issue with medication adherence due to polypharmacy.  However it is very difficult to adjust her dosage when she is not taking medication consistently.  We went over her regimen again today and patient agrees to continue taking her medication as instructed.  I would not adjust her regimen today and set up a follow-up appointment in 4 weeks with her PCP.  If her blood pressure remains uncontrolled after confirmed adherence, can consider workup for renal artery stenosis or OSA.   Patient discussed with Dr. [name]

## 2023-01-07 NOTE — Progress Notes (Signed)
Internal Medicine Clinic Attending  Case discussed with Dr. Nguyen  At the time of the visit.  We reviewed the resident's history and exam and pertinent patient test results.  I agree with the assessment, diagnosis, and plan of care documented in the resident's note. 

## 2023-01-08 IMAGING — CT CT ABD-PELV W/ CM
2 of 5 series · 16 of 46 positions shown, 18 images · IV contrast (Omni 300)
Comparison: 08/29/2021

CLINICAL DATA: Severe abdominal pain, nausea and vomiting, diarrhea

EXAM:
CT ABDOMEN AND PELVIS WITH CONTRAST
TECHNIQUE: Multidetector CT imaging of the abdomen and pelvis was performed
using the standard protocol following bolus administration of
intravenous contrast.

[Series 3: a/p w/ 5mm · axial · 0.93mm/px · z∈[-381,-26]mm · 13 of 81 slices shown, 15 images]
[im 5/81  soft-tissue]
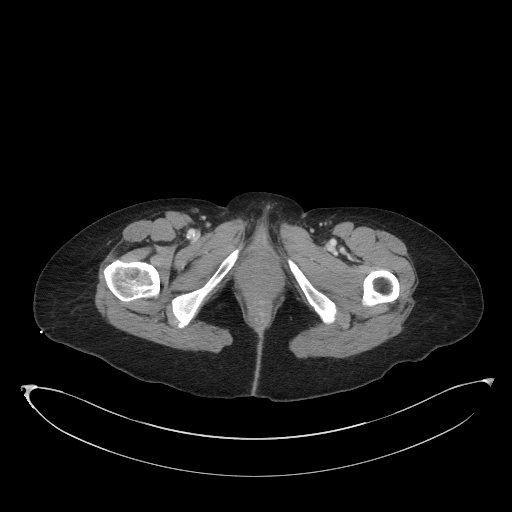
[im 5/81  bone]
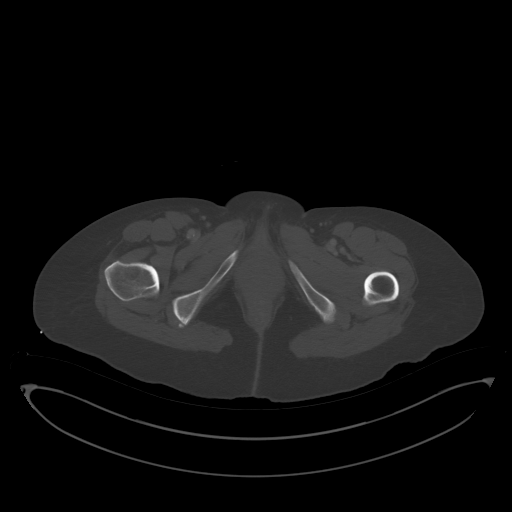
[im 13/81  soft-tissue]
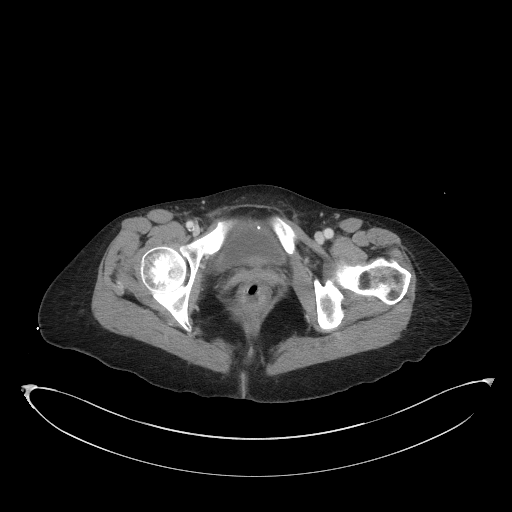
[im 17/81  soft-tissue]
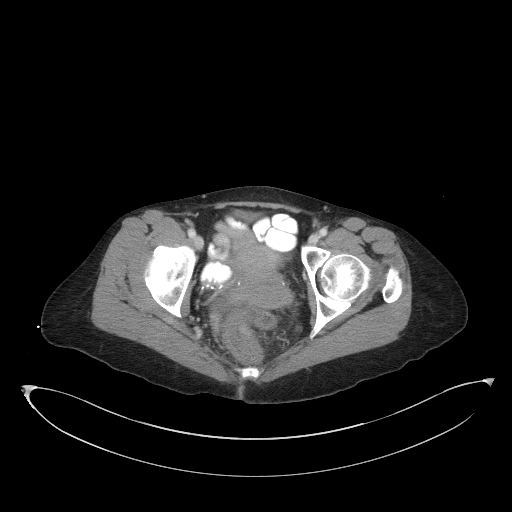
[im 22/81  soft-tissue]
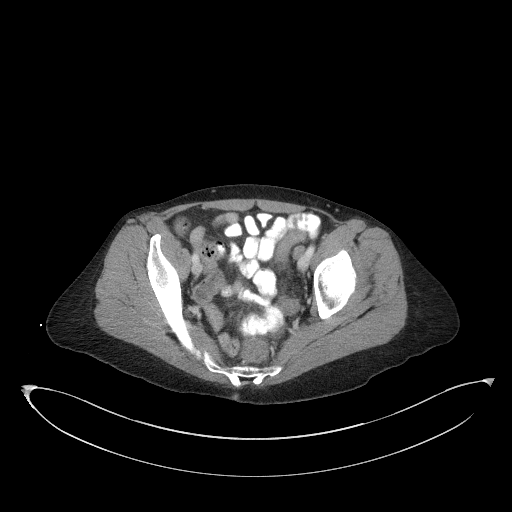
[im 30/81  soft-tissue]
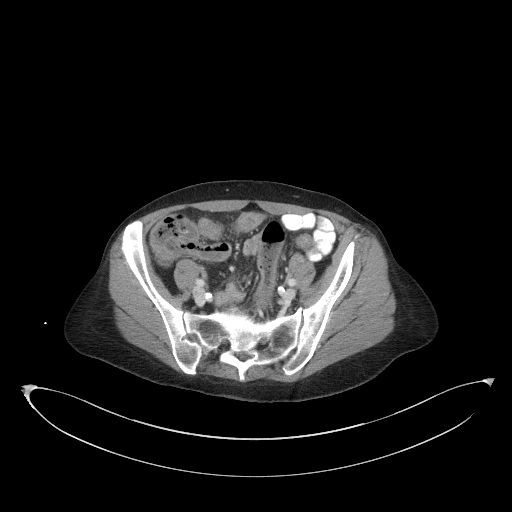
[im 34/81  soft-tissue]
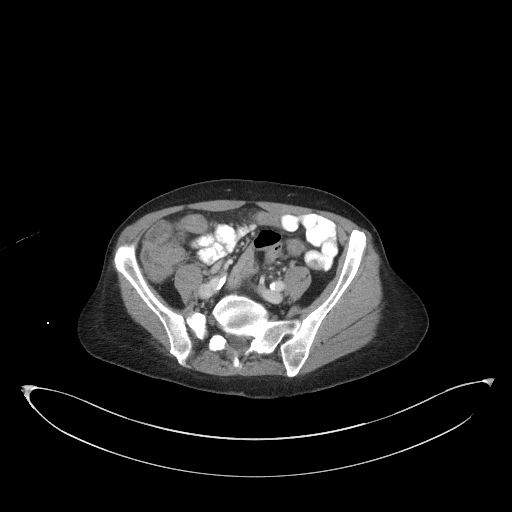
[im 43/81  soft-tissue]
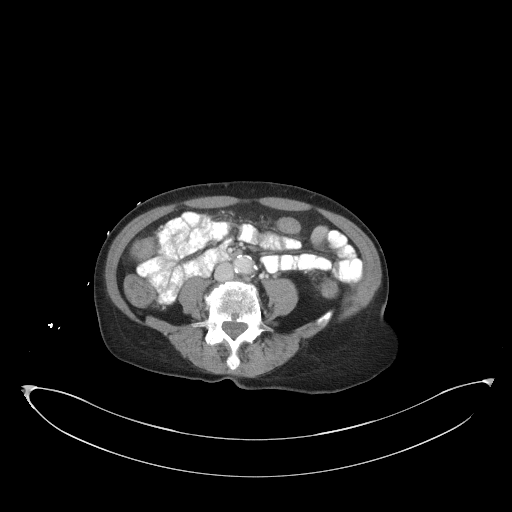
[im 47/81  soft-tissue]
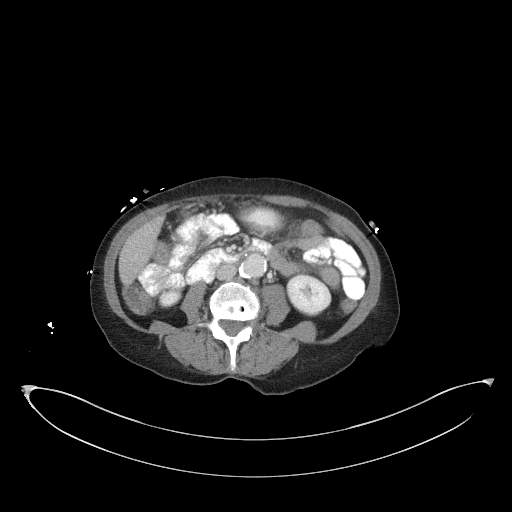
[im 51/81  soft-tissue]
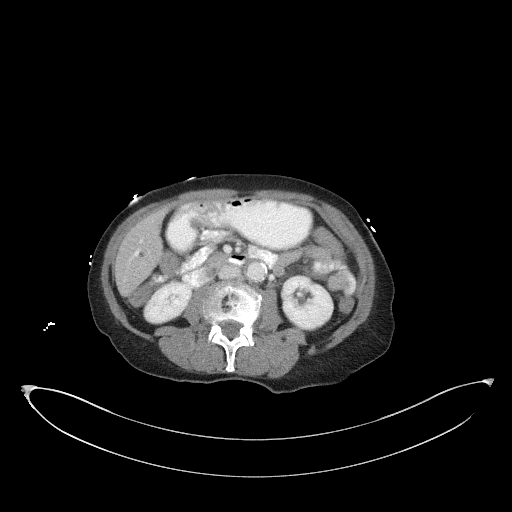
[im 51/81  bone]
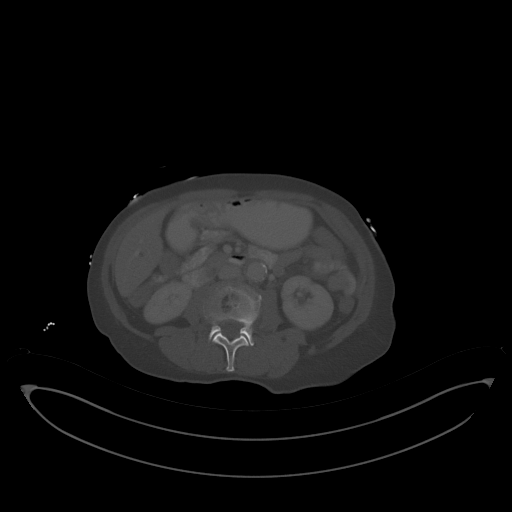
[im 59/81  soft-tissue]
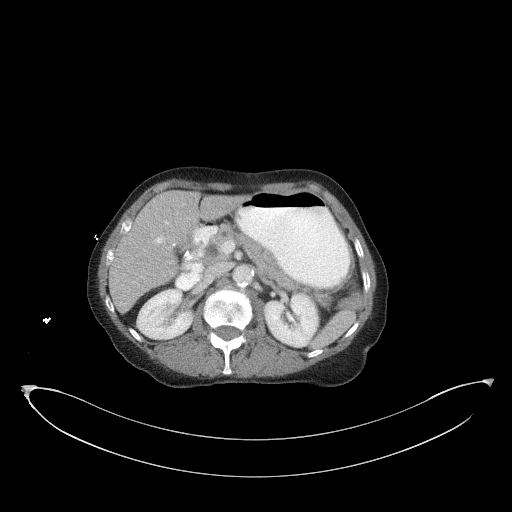
[im 64/81  soft-tissue]
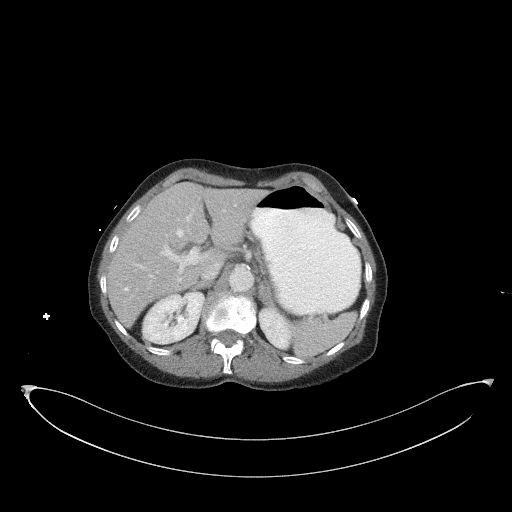
[im 68/81  soft-tissue]
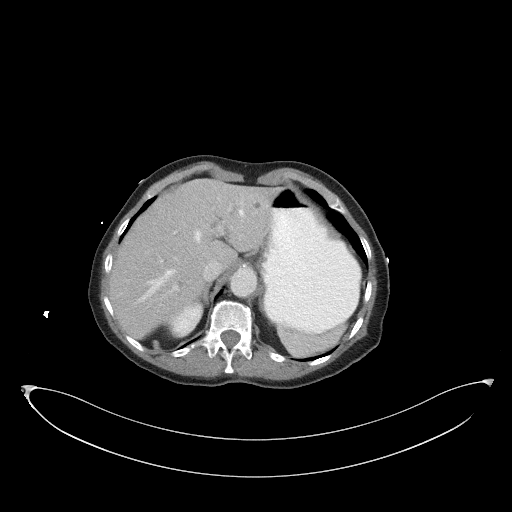
[im 76/81  soft-tissue]
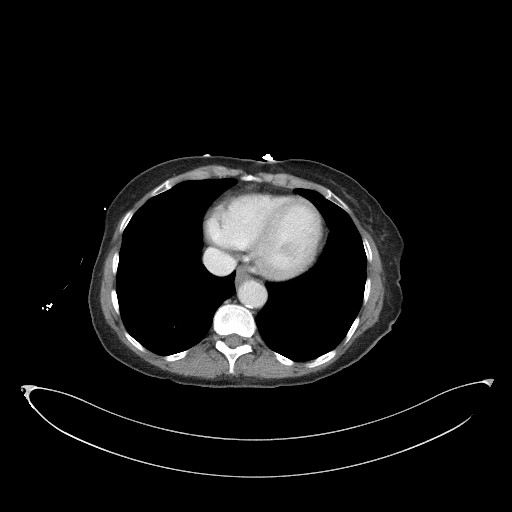

[Series 6: a/p w/ cor · coronal · 0.79mm/px · 3 of 126 slices shown]
[im 42/126  soft-tissue]
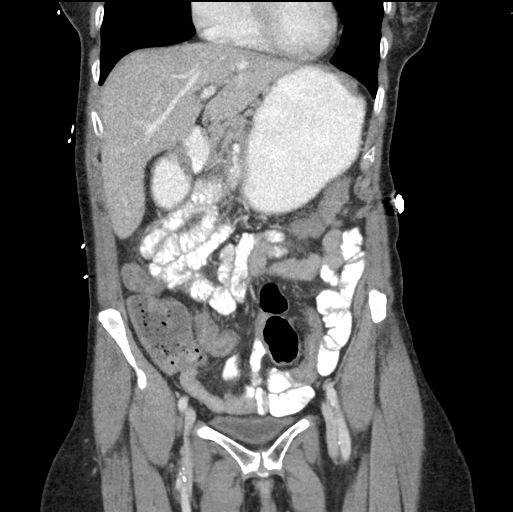
[im 56/126  soft-tissue]
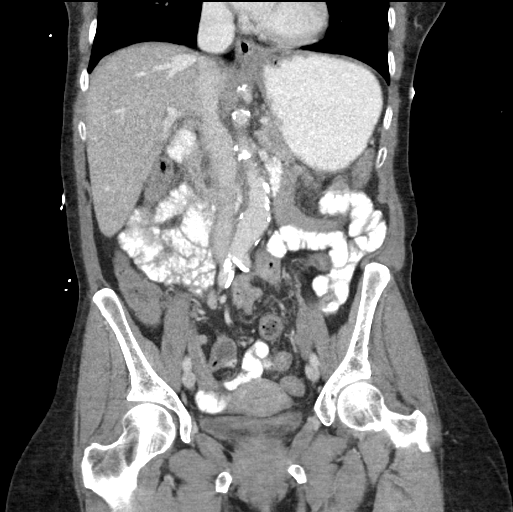
[im 70/126  soft-tissue]
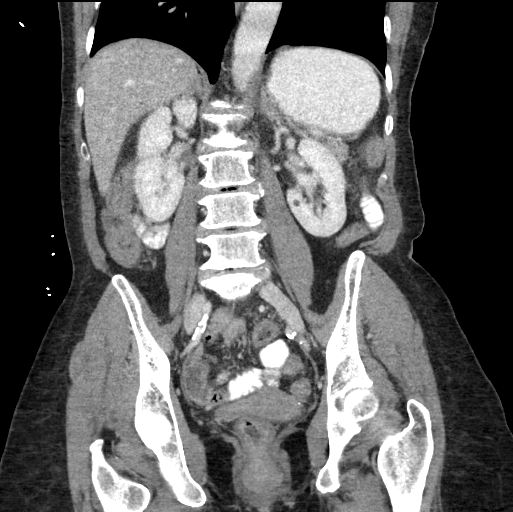

[16 of 46 positions shown; findings below may reference images not displayed]

RADIATION DOSE REDUCTION: This exam was performed according to the
departmental dose-optimization program which includes automated
exposure control, adjustment of the mA and/or kV according to
patient size and/or use of iterative reconstruction technique.

CONTRAST:  80mL OMNIPAQUE IOHEXOL 300 MG/ML  SOLN
FINDINGS: Lower chest: No acute pleural or parenchymal lung disease.

Hepatobiliary: Stable hepatic cysts. Stable transient hepatic
attenuation difference inferior right lobe liver, normalizing on
delayed images and stable since prior study. No other focal liver
abnormalities. No intrahepatic duct dilation. Gallbladder is
surgically absent.

Pancreas: Unremarkable. No pancreatic ductal dilatation or
surrounding inflammatory changes.

Spleen: Normal in size without focal abnormality.

Adrenals/Urinary Tract: Adrenal glands are unremarkable. Kidneys are
normal, without renal calculi, focal lesion, or hydronephrosis.
Bladder is decompressed, limiting its evaluation.

Stomach/Bowel: No bowel obstruction or ileus. Normal appendix right
lower quadrant. There is moderate rectal wall thickening which may
reflect proctitis. Mild wall thickening elsewhere throughout the
transverse and descending colon likely due to decompressed state.

Vascular/Lymphatic: Aortic atherosclerosis. No enlarged abdominal or
pelvic lymph nodes.

Reproductive: Uterus and bilateral adnexa are unremarkable.

Other: No free fluid or free intraperitoneal gas. Mild perirectal
fat stranding. No abdominal wall hernia.

Musculoskeletal: No acute or destructive bony lesions. Reconstructed
images demonstrate no additional findings.
IMPRESSION: 1. Rectal wall thickening and mild perirectal fat stranding,
consistent with proctitis.
2. Otherwise no acute intra-abdominal or intrapelvic process.
3.  Aortic Atherosclerosis ([id number]-[id number].G).

## 2023-01-12 ENCOUNTER — Other Ambulatory Visit: Payer: Self-pay

## 2023-01-12 ENCOUNTER — Encounter (HOSPITAL_COMMUNITY): Payer: Self-pay

## 2023-01-12 ENCOUNTER — Emergency Department (HOSPITAL_COMMUNITY)
Admission: EM | Admit: 2023-01-12 | Discharge: 2023-01-12 | Disposition: A | Discharge status: Home / Self Care | Payer: 59 | Attending: Emergency Medicine | Admitting: Emergency Medicine

## 2023-01-12 ENCOUNTER — Emergency Department (HOSPITAL_COMMUNITY): Payer: 59

## 2023-01-12 DIAGNOSIS — Z20822 Contact with and (suspected) exposure to covid-19: Secondary | ICD-10-CM | POA: Insufficient documentation

## 2023-01-12 DIAGNOSIS — R0789 Other chest pain: Secondary | ICD-10-CM | POA: Diagnosis not present

## 2023-01-12 DIAGNOSIS — Z79899 Other long term (current) drug therapy: Secondary | ICD-10-CM | POA: Insufficient documentation

## 2023-01-12 DIAGNOSIS — R0602 Shortness of breath: Secondary | ICD-10-CM | POA: Diagnosis not present

## 2023-01-12 DIAGNOSIS — R079 Chest pain, unspecified: Secondary | ICD-10-CM | POA: Diagnosis not present

## 2023-01-12 DIAGNOSIS — I10 Essential (primary) hypertension: Secondary | ICD-10-CM | POA: Diagnosis not present

## 2023-01-12 DIAGNOSIS — Z7901 Long term (current) use of anticoagulants: Secondary | ICD-10-CM | POA: Insufficient documentation

## 2023-01-12 DIAGNOSIS — R059 Cough, unspecified: Secondary | ICD-10-CM | POA: Diagnosis not present

## 2023-01-12 LAB — BASIC METABOLIC PANEL
Anion gap: 8 (ref 5–15)
BUN: 13 mg/dL (ref 8–23)
CO2: 27 mmol/L (ref 22–32)
Calcium: 10.1 mg/dL (ref 8.9–10.3)
Chloride: 104 mmol/L (ref 98–111)
Creatinine, Ser: 0.78 mg/dL (ref 0.44–1.00)
GFR, Estimated: >60 mL/min (ref >60)
Glucose, Bld: 88 mg/dL (ref 70–99)
Potassium: 3.3 mmol/L — ABNORMAL LOW (ref 3.5–5.1)
Sodium: 139 mmol/L (ref 135–145)

## 2023-01-12 LAB — CBC WITH DIFFERENTIAL/PLATELET
Abs Immature Granulocytes: 0.01 10*3/uL (ref 0.00–0.07)
Basophils Absolute: 0 10*3/uL (ref 0.0–0.1)
Basophils Relative: 1 %
Eosinophils Absolute: 0.1 10*3/uL (ref 0.0–0.5)
Eosinophils Relative: 3 %
HCT: 39.5 % (ref 36.0–46.0)
Hemoglobin: 12.8 g/dL (ref 12.0–15.0)
Immature Granulocytes: 0 %
Lymphocytes Relative: 43 %
Lymphs Abs: 1.9 10*3/uL (ref 0.7–4.0)
MCH: 26.8 pg (ref 26.0–34.0)
MCHC: 32.4 g/dL (ref 30.0–36.0)
MCV: 82.8 fL (ref 80.0–100.0)
Monocytes Absolute: 0.4 10*3/uL (ref 0.1–1.0)
Monocytes Relative: 10 %
Neutro Abs: 1.8 10*3/uL (ref 1.7–7.7)
Neutrophils Relative %: 43 %
Platelets: 208 10*3/uL (ref 150–400)
RBC: 4.77 MIL/uL (ref 3.87–5.11)
RDW: 16.7 % — ABNORMAL HIGH (ref 11.5–15.5)
WBC: 4.3 10*3/uL (ref 4.0–10.5)
nRBC: 0 % (ref 0.0–0.2)

## 2023-01-12 LAB — SARS CORONAVIRUS 2 BY RT PCR: SARS Coronavirus 2 by RT PCR: NEGATIVE

## 2023-01-12 LAB — TROPONIN I (HIGH SENSITIVITY): Troponin I (High Sensitivity): 8 ng/L (ref <18)

## 2023-01-12 NOTE — ED Provider Notes (Signed)
[place] EMERGENCY DEPARTMENT AT [facility] Provider Note   CSN: [id number] Arrival date & time: 01/12/23  1111     History  Chief Complaint  Patient presents with   Chest Pain   Shortness of Breath    [name] is a 72 y.o. female hx of HTN, afib on chronic anticoagulation, GERD here for evaluation of CP, cough and SOB. States over 1 week ago. Coughing up phlegm however feels "like I can't get it up." CP non exertional, nonpleuritic in nature. No PND, orthopnea, LE swelling, pain. No hx of PE, DVT. Compliant with anticoagulation. No recent falls or injuries. No abd pain, nausea, vomiting, syncope, back pain, numbness, weakness. Saw PCP earlier in the week thought CP was non cardiac in nature.  Chest pain typically occurs after she "swallowed a handful of pills."  States she takes greater than 8 pills at 1 time in the morning.  Has when she gets her chest pain.  She feels like her chest pain is related to taking all of her pills.  She is wondering if she can come off of some of her pills. Pain intermittent, associated after taking her morning BP meds.  Seems very anxious about discussing "all of my pills."  She feels like "200 mg" of blood pressure medications are too much.  No pain currently.  HPI     Home Medications Prior to Admission medications   Medication Sig Start Date End Date Taking? Authorizing Provider  alendronate (FOSAMAX) 70 MG tablet Take 1 tablet (70 mg total) by mouth every 7 (seven) days. Take with a full glass of water on an empty stomach. 01/06/23 01/06/24  Doran Stabler, DO  atorvastatin (LIPITOR) 40 MG tablet Take 1 tablet (40 mg total) by mouth daily. 10/12/22 10/12/23  Gwenevere Abbot, MD  cetirizine (ZYRTEC) 10 MG tablet Take 10 mg by mouth daily.    [provider]  diclofenac Sodium (VOLTAREN) 1 % GEL APPLY 2 GRAMS TO AFFECTED AREA FOUR TIMES DAILY 11/05/22   Miguel Aschoff, MD  ezetimibe (ZETIA) 10 MG tablet Take 1 tablet (10 mg  total) by mouth daily. 12/07/21 12/07/22  Miguel Aschoff, MD  fluticasone (FLONASE) 50 MCG/ACT nasal spray Place 1 spray into both nostrils daily. 10/04/22 10/04/23  Nooruddin, Jason Fila, MD  fluticasone (FLOVENT HFA) 110 MCG/ACT inhaler Inhale 1 puff into the lungs daily. Inhale 1 puff into the lungs daily. 09/16/22   Miguel Aschoff, MD  HYDROcodone-acetaminophen (NORCO/VICODIN) 5-325 MG tablet 1/2 to 1 tablet only when needed for severe pain, as often as every 6 hours. 12/02/22   Miguel Aschoff, MD  levalbuterol [facility] HFA) 45 MCG/ACT inhaler Inhale 1 puff into the lungs every 6 (six) hours as needed for shortness of breath. 03/04/22   Miguel Aschoff, MD  linaclotide [facility]) 145 MCG CAPS capsule Take 1 capsule (145 mcg total) by mouth daily before breakfast. 11/05/22 12/05/22  Miguel Aschoff, MD  losartan (COZAAR) 100 MG tablet Take 1 tablet (100 mg total) by mouth daily. 01/06/23 01/06/24  Doran Stabler, DO  metoprolol succinate (TOPROL-XL) 50 MG 24 hr tablet Take 1 tablet (50 mg total) by mouth daily. Take with or immediately following a meal. 09/30/22   Miguel Aschoff, MD  montelukast (SINGULAIR) 10 MG tablet Take 1 tablet (10 mg total) by mouth at bedtime. 03/04/22   Miguel Aschoff, MD  omeprazole (PRILOSEC) 40 MG capsule Take 1 capsule (40 mg total) by mouth in the morning  and at bedtime. 09/16/22   Miguel Aschoff, MD  polyethylene glycol (MIRALAX / GLYCOLAX) 17 g packet Take 17 g by mouth 2 (two) times daily. 09/29/21   Dellis Filbert, MD  potassium chloride (KLOR-CON M) 10 MEQ tablet Take 1 tablet (10 mEq total) by mouth daily. 10/18/22   Miguel Aschoff, MD  rivaroxaban (XARELTO) 20 MG TABS tablet Take 1 tablet (20 mg total) by mouth daily with supper. 01/12/22   Newman Nip, NP  senna-docusate (SENNA-S) 8.6-50 MG tablet Take 1 tablet by mouth 2 (two) times daily. Or as directed by physician. 09/16/22   Miguel Aschoff, MD  spironolactone (ALDACTONE)  25 MG tablet Take 2 tablets (50 mg total) by mouth daily. For blood pressure. 12/02/22 12/02/23  Miguel Aschoff, MD  tiotropium (SPIRIVA) 18 MCG inhalation capsule Place 1 capsule (18 mcg total) into inhaler and inhale daily. 10/16/21   Miguel Aschoff, MD      Allergies    Albuterol, Percocet [oxycodone-acetaminophen], Celecoxib, and Tramadol    Review of Systems   Review of Systems  Constitutional: Negative.   HENT: Negative.    Respiratory:  Positive for cough and shortness of breath.   Cardiovascular:  Positive for chest pain. Negative for palpitations and leg swelling.  Gastrointestinal: Negative.   Genitourinary: Negative.   Musculoskeletal: Negative.   Skin: Negative.   Neurological: Negative.   All other systems reviewed and are negative.   Physical Exam Updated Vital Signs BP (!) 164/87   Pulse (!) 56   Temp 97.8 F (36.6 C) (Oral)   Resp 18   Wt 63 kg   SpO2 99%   BMI 21.75 kg/m  Physical Exam Vitals and nursing note reviewed.  Constitutional:      General: She is not in acute distress.    Appearance: She is well-developed. She is not ill-appearing, toxic-appearing or diaphoretic.  HENT:     Head: Atraumatic.  Eyes:     Pupils: Pupils are equal, round, and reactive to light.  Cardiovascular:     Rate and Rhythm: Normal rate.     Pulses:          Radial pulses are 2+ on the right side and 2+ on the left side.       Dorsalis pedis pulses are 2+ on the right side and 2+ on the left side.     Heart sounds: Normal heart sounds.  Pulmonary:     Effort: Pulmonary effort is normal. No respiratory distress.     Breath sounds: Normal breath sounds.  Chest:     Chest wall: No mass, tenderness or crepitus.  Abdominal:     General: Bowel sounds are normal. There is no distension.     Palpations: Abdomen is soft.  Musculoskeletal:        General: Normal range of motion.     Cervical back: Normal range of motion.     Right lower leg: No tenderness. No  edema.     Left lower leg: No tenderness. No edema.  Skin:    General: Skin is warm and dry.     Capillary Refill: Capillary refill takes less than 2 seconds.  Neurological:     General: No focal deficit present.     Mental Status: She is alert.  Psychiatric:        Mood and Affect: Mood normal.     ED Results / Procedures / Treatments   Labs (all labs ordered are listed, but  only abnormal results are displayed) Labs Reviewed  CBC WITH DIFFERENTIAL/PLATELET - Abnormal; Notable for the following components:      Result Value   RDW 16.7 (*)    All other components within normal limits  BASIC METABOLIC PANEL - Abnormal; Notable for the following components:   Potassium 3.3 (*)    All other components within normal limits  SARS CORONAVIRUS 2 BY RT PCR  TROPONIN I (HIGH SENSITIVITY)  TROPONIN I (HIGH SENSITIVITY)    EKG None  Radiology DG Chest 2 View  Result Date: 01/12/2023 CLINICAL DATA:  Cough, chest pain, shortness of breath. EXAM: CHEST - 2 VIEW COMPARISON:  Chest x-ray dated September 27, 2022. FINDINGS: The heart size and mediastinal contours are within normal limits. Normal pulmonary vascularity. The lungs remain hyperinflated. No focal consolidation, pleural effusion, or pneumothorax. Chronic scarring at the right costophrenic angle. IMPRESSION: No active cardiopulmonary disease. Electronically Signed   By: Obie Dredge M.D.   On: 01/12/2023 13:30    Procedures Procedures    Medications Ordered in ED Medications - No data to display  ED Course/ Medical Decision Making/ A&P   72 here for evaluation of chest pain.  Has been going on for greater than 1 week.  Nonexertional, nonpleuritic in nature.  No radiation.  No associated diaphoresis, nausea or vomiting.  Also has a cough productive of "phlegm."  She does not appear clinically fluid overloaded.  She is currently anticoagulated for A-fib states she has not missed any doses.  No clinical evidence of VTE on exam.   No tachycardia, tachypnea or hypoxia.  Does admit to some shortness of breath however this is not exertional in nature.  Will occur for a "few seconds" and then resolved. States that most of her symptoms occur after taking a handful of pills in the morning.  States she takes greater then 8 pills at 1 time.  Plan on labs, imaging, wonder if she has developed some sort of pill esophagitis, no doxycycline use.  Labs and imaging personally viewed and interpreted:  CBC without leukocytosis Metabolic panel potassium 3.3 COVID-negative  Troponin 8 EKG without ischemic changes  Discussed results with patient in the room.  At this time I low suspicion for acute ACS, PE, dissection, AAA, pneumonia, pneumothorax, fluid overload, myocarditis, pericarditis, esophagitis, Boerhaave rupture, intraabdominal etiology.  She does seem very anxious when talking about all of her prescriptions, she seems like she is some sort of pill fatigue with all of her prescriptions.  We discussed spacing them out and her talking with her primary care provider about her pills, however she is hypertensive here I do not want to remove any of her antihypertensives at this time.  The patient has been appropriately medically screened and/or stabilized in the ED. I have low suspicion for any other emergent medical condition which would require further screening, evaluation or treatment in the ED or require inpatient management.  Patient is hemodynamically stable and in no acute distress.  Patient able to ambulate in department prior to ED.  Evaluation does not show acute pathology that would require ongoing or additional emergent interventions while in the emergency department or further inpatient treatment.  I have discussed the diagnosis with the patient and answered all questions.  Pain is been managed while in the emergency department and patient has no further complaints prior to discharge.  Patient is comfortable with plan discussed in  room and is stable for discharge at this time.  I have discussed strict return  precautions for returning to the emergency department.  Patient was encouraged to follow-up with PCP/specialist refer to at discharge.                             Medical Decision Making Amount and/or Complexity of Data Reviewed External Data Reviewed: labs, radiology, ECG and notes. Labs: ordered. Decision-making details documented in ED Course. Radiology: ordered and independent interpretation performed. Decision-making details documented in ED Course. ECG/medicine tests: ordered and independent interpretation performed. Decision-making details documented in ED Course.  Risk OTC drugs. Prescription drug management. Decision regarding hospitalization.          Final Clinical Impression(s) / ED Diagnoses Final diagnoses:  Atypical chest pain  Medication management    Rx / DC Orders ED Discharge Orders     None         Graciemae Delisle A, PA-C 01/12/23 1442    Gloris Manchester, MD 01/16/23 1710

## 2023-01-12 NOTE — Discharge Instructions (Addendum)
It was a pleasure taking care of you here in the emergency department  Your workup was reassuring today  As discussed in the room we have spaced out your pills so that way you do not get pain in your chest when taking a handful of them  If you continue to have symptoms follow-up with your primary care provider cardiology  Return for new or worsening symptoms.

## 2023-01-12 NOTE — ED Triage Notes (Signed)
C/o centralized CP, sob, congestion, and productive cough x2 weeks. HTN with bp 180/92 @ home and was prescribed meds w/o relief.  Pt currently on Xarelto.  Denies LE edema

## 2023-01-13 IMAGING — CT CT ABD-PELV W/ CM
2 of 5 series · 11 of 46 positions shown, 12 images · IV contrast (APPLIED)
Comparison: CT Abdomen and Pelvis 09/22/2021 and earlier.
COMPARISON: CT Abdomen and Pelvis 09/22/2021 and earlier.

Addendum:
CLINICAL DATA: 70-year-old female with abdominal pain. Status post
colonoscopy several days ago.

EXAM:
CT ABDOMEN AND PELVIS WITH CONTRAST
TECHNIQUE: Multidetector CT imaging of the abdomen and pelvis was performed
using the standard protocol following bolus administration of
intravenous contrast.

[Series 4: abdomen 5.0 · axial · 0.71mm/px · z∈[-1266,-881]mm · 8 of 89 slices shown, 9 images]
[im 6/89  soft-tissue]
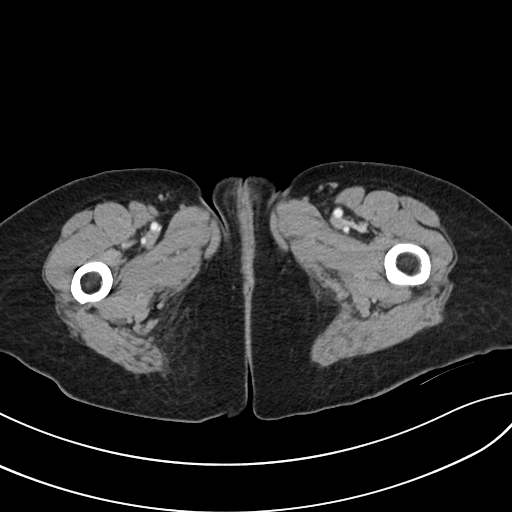
[im 6/89  bone]
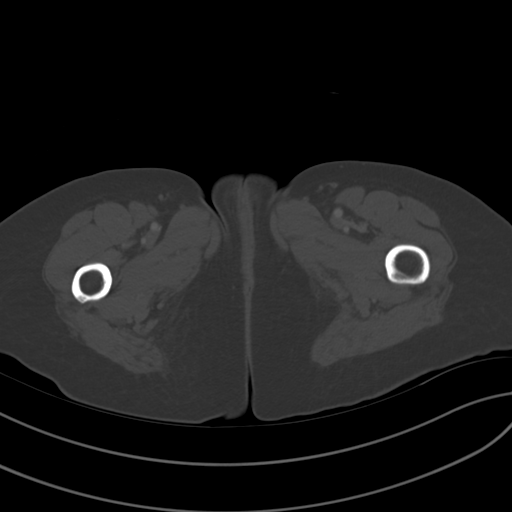
[im 18/89  soft-tissue]
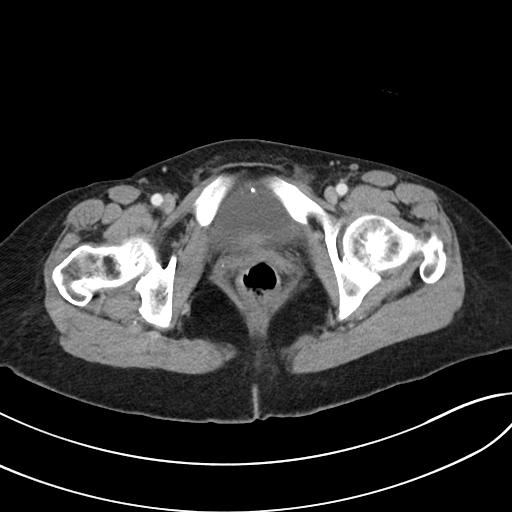
[im 30/89  soft-tissue]
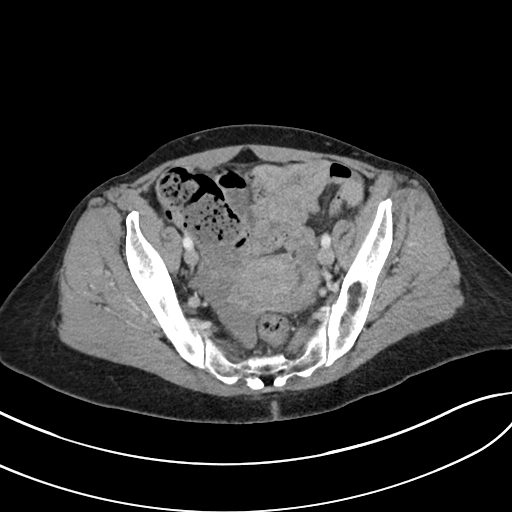
[im 42/89  soft-tissue]
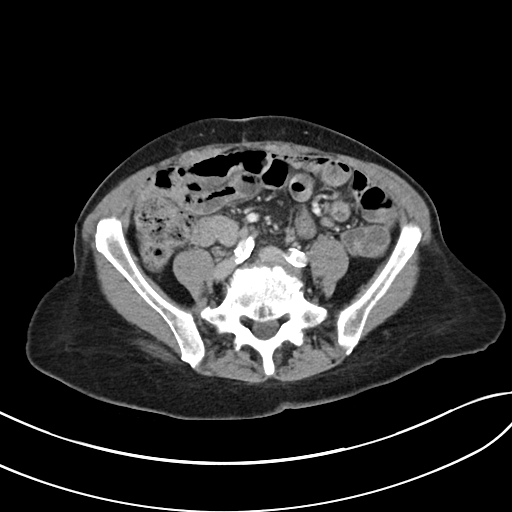
[im 47/89  soft-tissue]
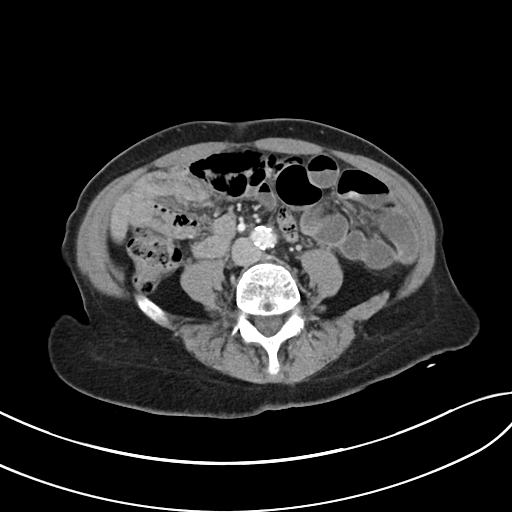
[im 59/89  soft-tissue]
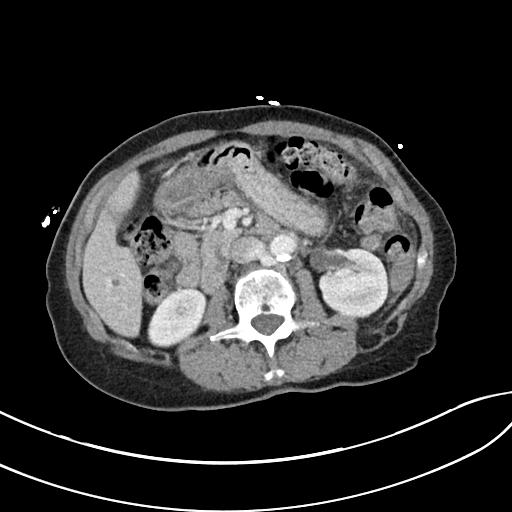
[im 71/89  soft-tissue]
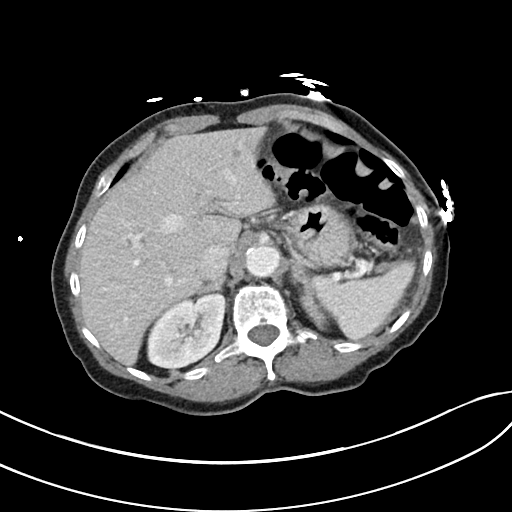
[im 83/89  soft-tissue]
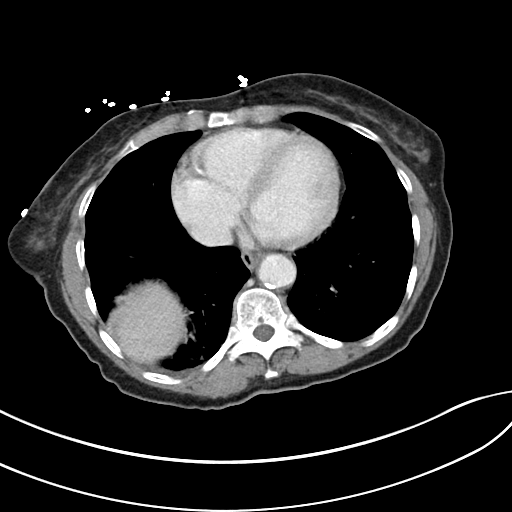

[Series 7: abdomen 3.0 mpr cor · coronal · 0.54mm/px · 3 of 84 slices shown]
[im 28/84  soft-tissue]
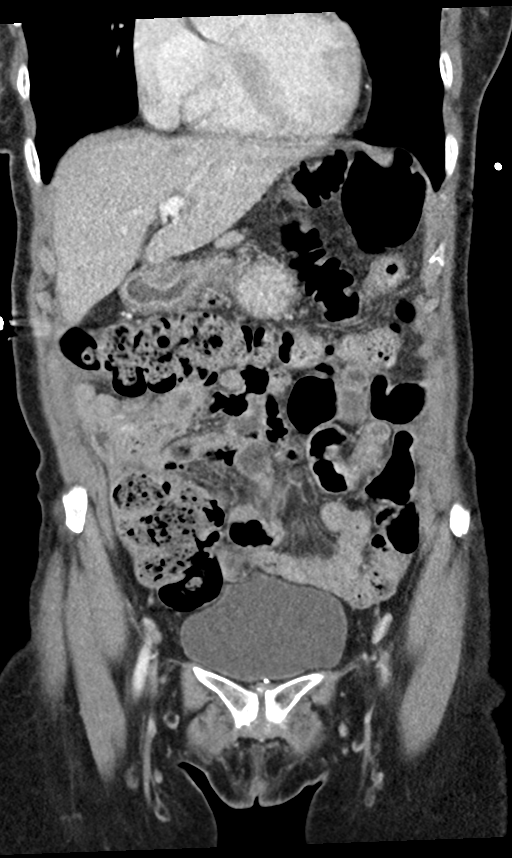
[im 37/84  soft-tissue]
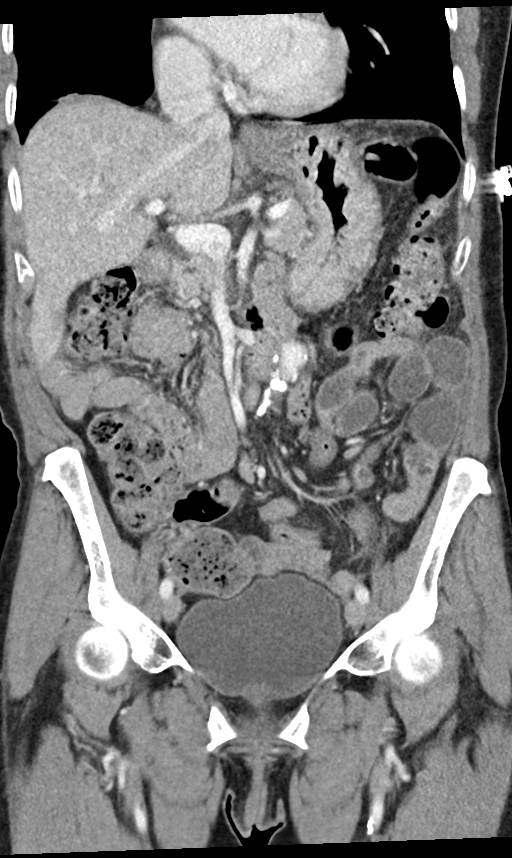
[im 47/84  soft-tissue]
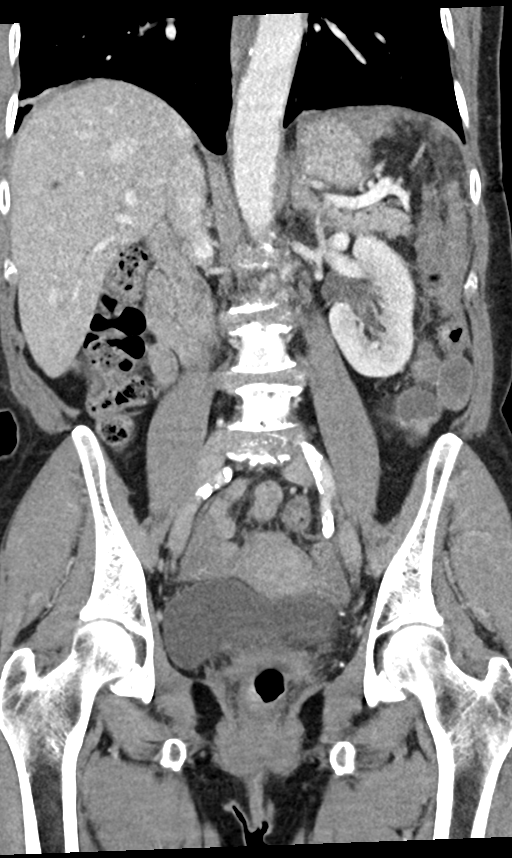

[11 of 46 positions shown; findings below may reference images not displayed]

RADIATION DOSE REDUCTION: This exam was performed according to the
departmental dose-optimization program which includes automated
exposure control, adjustment of the mA and/or kV according to
patient size and/or use of iterative reconstruction technique.

CONTRAST:  80mL OMNIPAQUE IOHEXOL 300 MG/ML  SOLN
FINDINGS: Lower chest: Increased elevation of the right hemidiaphragm with
posterolateral basal segment lung opacity indeterminate for
atelectasis versus infection. 5 mm right lower lobe lung nodule on
series 6, image 7 was subtle last month and present on a 5642
cardiac CT, stable and benign. No pericardial or pleural effusion.
Mild left lung base atelectasis.

Hepatobiliary: Stable multiple circumscribed hepatic cysts.
Surgically absent gallbladder. Small linear area of inferior right
lobe hypoenhancement appears unchanged since since last year and
benign.

Pancreas: Negative. Chronic enlargement of the pancreatic duct in
the head and uncinate appears stable since last year.

Spleen: Small volume of perisplenic fluid in cleat, including caudal
to the spleen, with complex fluid density consistent with
hemoperitoneum. At the same time, there is no discrete splenic
laceration. No convincing splenic contusion.

Adrenals/Urinary Tract: Adrenal glands and kidneys appear stable and
within normal limits. Unremarkable bladder. Chronic pelvic
phleboliths. No urinary calculus identified. Symmetric renal
contrast excretion.

Stomach/Bowel: Decompressed rectum and sigmoid colon now with a more
normal appearance compared to 09/22/2021. Redundant sigmoid tracking
into the left upper quadrant. Decompressed descending colon and
splenic flexure. Redundant transverse colon with retained stool.
Similar retained stool in the right colon. No acute large bowel wall
thickening identified. Normal appendix suspected on coronal image
41.

Negative terminal ileum. No dilated small bowel. Decompressed
stomach. Duodenum is decompressed and does appear to cross midline
although proximal small bowel loops appear to be in the right upper
quadrant.

No free air. No free fluid identified in the mesentery.

Vascular/Lymphatic: Extensive Aortoiliac calcified atherosclerosis.
Mildly tortuous abdominal aorta. Major arterial structures remain
patent. Portal venous system is patent. No lymphadenopathy
identified.

Reproductive: Stable and within normal limits.

Other: Complex fluid, hemoperitoneum in the pelvis, small to
moderate volume (series 4, image 61)

Musculoskeletal: Advanced lumbar disc and endplate degeneration
redemonstrated. No acute or suspicious osseous abnormality
identified.
IMPRESSION: 1. Positive for Hemoperitoneum - about the spleen and layering in
the pelvis, small to moderate volume.
No free air or discrete bowel injury identified.
Acute splenic injury felt to be the most likely source of bleeding
in this setting, despite no obvious splenic laceration.

2. Redundant large bowel with retained stool. Regression of
rectosigmoid colon wall thickening since last month.

3. Increased elevation of the right hemidiaphragm with right lung
base opacity indeterminate for atelectasis versus infection. Right
lower lobe lung nodule stable since 5642 and benign (no follow-up
indicated).

4. Aortic Atherosclerosis (JT5YM-XS8.8).

ADDENDUM:
Study discussed by telephone with Dr. AHMADSHAKIB ROIA on
09/27/2021 at 0319 hours.

*** End of Addendum ***
RADIATION DOSE REDUCTION: This exam was performed according to the
departmental dose-optimization program which includes automated
exposure control, adjustment of the mA and/or kV according to
patient size and/or use of iterative reconstruction technique.

CONTRAST:  80mL OMNIPAQUE IOHEXOL 300 MG/ML  SOLN
FINDINGS: Lower chest: Increased elevation of the right hemidiaphragm with
posterolateral basal segment lung opacity indeterminate for
atelectasis versus infection. 5 mm right lower lobe lung nodule on
series 6, image 7 was subtle last month and present on a 5642
cardiac CT, stable and benign. No pericardial or pleural effusion.
Mild left lung base atelectasis.

Hepatobiliary: Stable multiple circumscribed hepatic cysts.
Surgically absent gallbladder. Small linear area of inferior right
lobe hypoenhancement appears unchanged since since last year and
benign.

Pancreas: Negative. Chronic enlargement of the pancreatic duct in
the head and uncinate appears stable since last year.

Spleen: Small volume of perisplenic fluid in cleat, including caudal
to the spleen, with complex fluid density consistent with
hemoperitoneum. At the same time, there is no discrete splenic
laceration. No convincing splenic contusion.

Adrenals/Urinary Tract: Adrenal glands and kidneys appear stable and
within normal limits. Unremarkable bladder. Chronic pelvic
phleboliths. No urinary calculus identified. Symmetric renal
contrast excretion.

Stomach/Bowel: Decompressed rectum and sigmoid colon now with a more
normal appearance compared to 09/22/2021. Redundant sigmoid tracking
into the left upper quadrant. Decompressed descending colon and
splenic flexure. Redundant transverse colon with retained stool.
Similar retained stool in the right colon. No acute large bowel wall
thickening identified. Normal appendix suspected on coronal image
41.

Negative terminal ileum. No dilated small bowel. Decompressed
stomach. Duodenum is decompressed and does appear to cross midline
although proximal small bowel loops appear to be in the right upper
quadrant.

No free air. No free fluid identified in the mesentery.

Vascular/Lymphatic: Extensive Aortoiliac calcified atherosclerosis.
Mildly tortuous abdominal aorta. Major arterial structures remain
patent. Portal venous system is patent. No lymphadenopathy
identified.

Reproductive: Stable and within normal limits.

Other: Complex fluid, hemoperitoneum in the pelvis, small to
moderate volume (series 4, image 61)

Musculoskeletal: Advanced lumbar disc and endplate degeneration
redemonstrated. No acute or suspicious osseous abnormality
identified.
IMPRESSION: 1. Positive for Hemoperitoneum - about the spleen and layering in
the pelvis, small to moderate volume.
No free air or discrete bowel injury identified.
Acute splenic injury felt to be the most likely source of bleeding
in this setting, despite no obvious splenic laceration.

2. Redundant large bowel with retained stool. Regression of
rectosigmoid colon wall thickening since last month.

3. Increased elevation of the right hemidiaphragm with right lung
base opacity indeterminate for atelectasis versus infection. Right
lower lobe lung nodule stable since 5642 and benign (no follow-up
indicated).

4. Aortic Atherosclerosis (JT5YM-XS8.8).

## 2023-01-18 ENCOUNTER — Other Ambulatory Visit: Payer: Self-pay

## 2023-01-18 ENCOUNTER — Other Ambulatory Visit (HOSPITAL_COMMUNITY): Payer: Self-pay | Admitting: *Deleted

## 2023-01-18 DIAGNOSIS — I10 Essential (primary) hypertension: Secondary | ICD-10-CM

## 2023-01-18 DIAGNOSIS — J4520 Mild intermittent asthma, uncomplicated: Secondary | ICD-10-CM

## 2023-01-18 MED ORDER — RIVAROXABAN 20 MG PO TABS: 20.0000 mg | ORAL_TABLET | Freq: Every day | ORAL | 11 refills | Status: DC

## 2023-01-19 ENCOUNTER — Telehealth: Payer: Self-pay

## 2023-01-19 MED ORDER — METOPROLOL SUCCINATE ER 50 MG PO TB24
50.0000 mg | ORAL_TABLET | Freq: Every day | ORAL | 5 refills | Status: DC
Start: 2023-01-19 — End: 2023-07-25

## 2023-01-19 MED ORDER — LOSARTAN POTASSIUM 100 MG PO TABS
100.0000 mg | ORAL_TABLET | Freq: Every day | ORAL | 3 refills | Status: DC
Start: 2023-01-19 — End: 2024-01-24

## 2023-01-19 MED ORDER — TIOTROPIUM BROMIDE MONOHYDRATE 18 MCG IN CAPS: 18.0000 ug | ORAL_CAPSULE | Freq: Every day | RESPIRATORY_TRACT | 3 refills | Status: DC

## 2023-01-19 NOTE — Telephone Encounter (Signed)
Pt is requesting a call back .Marland Kitchen She stated that her breathing is starting to act up due to she is completely out of her spirva  she called it in yesterday was sent to the PCP

## 2023-01-19 NOTE — Telephone Encounter (Signed)
Spiriva has been refilled - called pt t inform her, no answer; left message.

## 2023-01-20 ENCOUNTER — Other Ambulatory Visit: Payer: Self-pay

## 2023-01-20 MED ORDER — MONTELUKAST SODIUM 10 MG PO TABS: 10.0000 mg | ORAL_TABLET | Freq: Every day | ORAL | 3 refills | Status: DC

## 2023-01-25 IMAGING — DX DG ABDOMEN 1V
1 series · 1 of 1 positions shown · non-contrast
Comparison: November 01, 2017.

CLINICAL DATA: Left upper quadrant abdominal pain.  Constipation

EXAM:
ABDOMEN - 1 VIEW

[t abdomen supine]
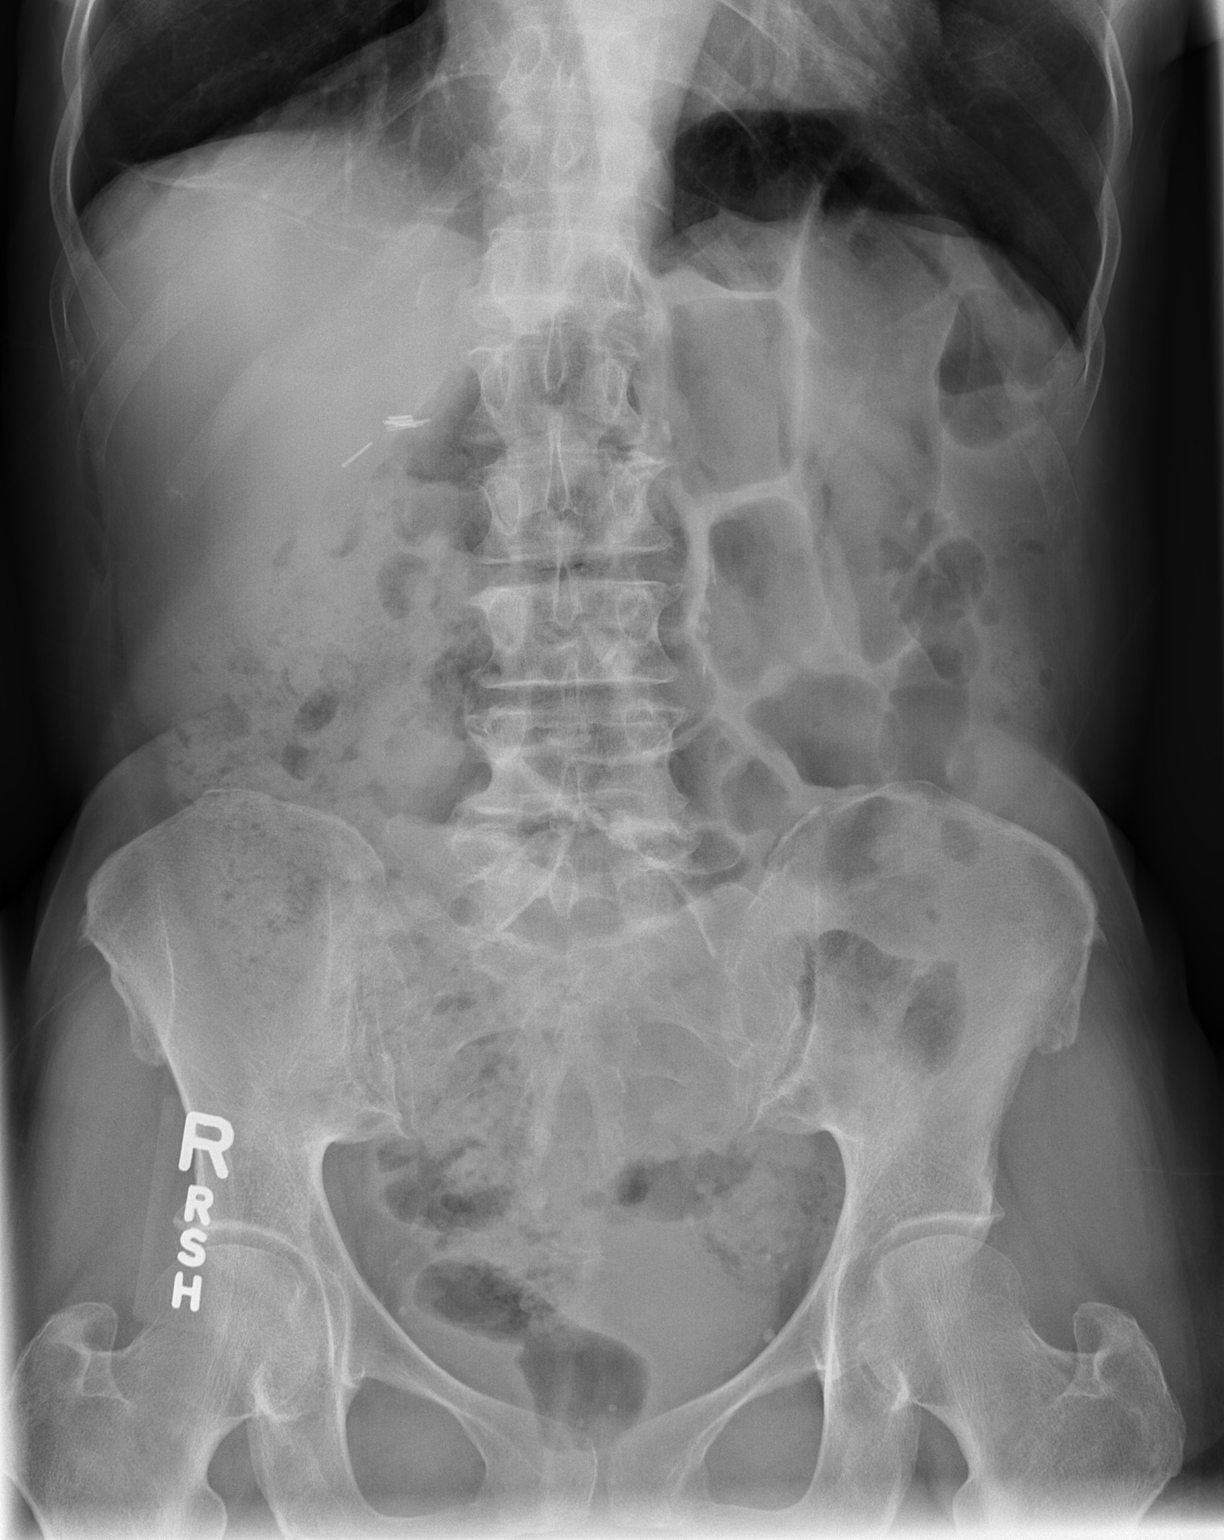

[1 of 1 positions shown; findings below may reference images not displayed]

FINDINGS: The bowel gas pattern is normal. Status post cholecystectomy. No
radio-opaque calculi or other significant radiographic abnormality
are seen.
IMPRESSION: No abnormal bowel dilatation.

## 2023-01-27 ENCOUNTER — Other Ambulatory Visit: Payer: Self-pay

## 2023-01-27 DIAGNOSIS — K5909 Other constipation: Secondary | ICD-10-CM

## 2023-01-31 ENCOUNTER — Other Ambulatory Visit: Payer: Self-pay

## 2023-01-31 DIAGNOSIS — K5909 Other constipation: Secondary | ICD-10-CM

## 2023-01-31 MED ORDER — LINACLOTIDE 145 MCG PO CAPS
145.0000 ug | ORAL_CAPSULE | Freq: Every day | ORAL | 11 refills | Status: DC
Start: 2023-01-31 — End: 2024-04-20

## 2023-01-31 MED ORDER — LINACLOTIDE 145 MCG PO CAPS
145.0000 ug | ORAL_CAPSULE | Freq: Every day | ORAL | 0 refills | Status: DC
Start: 2023-01-31 — End: 2023-01-31

## 2023-02-10 ENCOUNTER — Other Ambulatory Visit: Payer: Self-pay

## 2023-02-11 MED ORDER — POTASSIUM CHLORIDE CRYS ER 10 MEQ PO TBCR: 10.0000 meq | EXTENDED_RELEASE_TABLET | Freq: Every day | ORAL | 0 refills | Status: DC

## 2023-02-13 IMAGING — DX DG CHEST 1V PORT
1 series · 1 of 1 positions shown · non-contrast
Comparison: Chest x-ray 08/29/2021.

CLINICAL DATA: 70-year-old female with history of chest pain.

EXAM:
PORTABLE CHEST 1 VIEW

[chest ap]
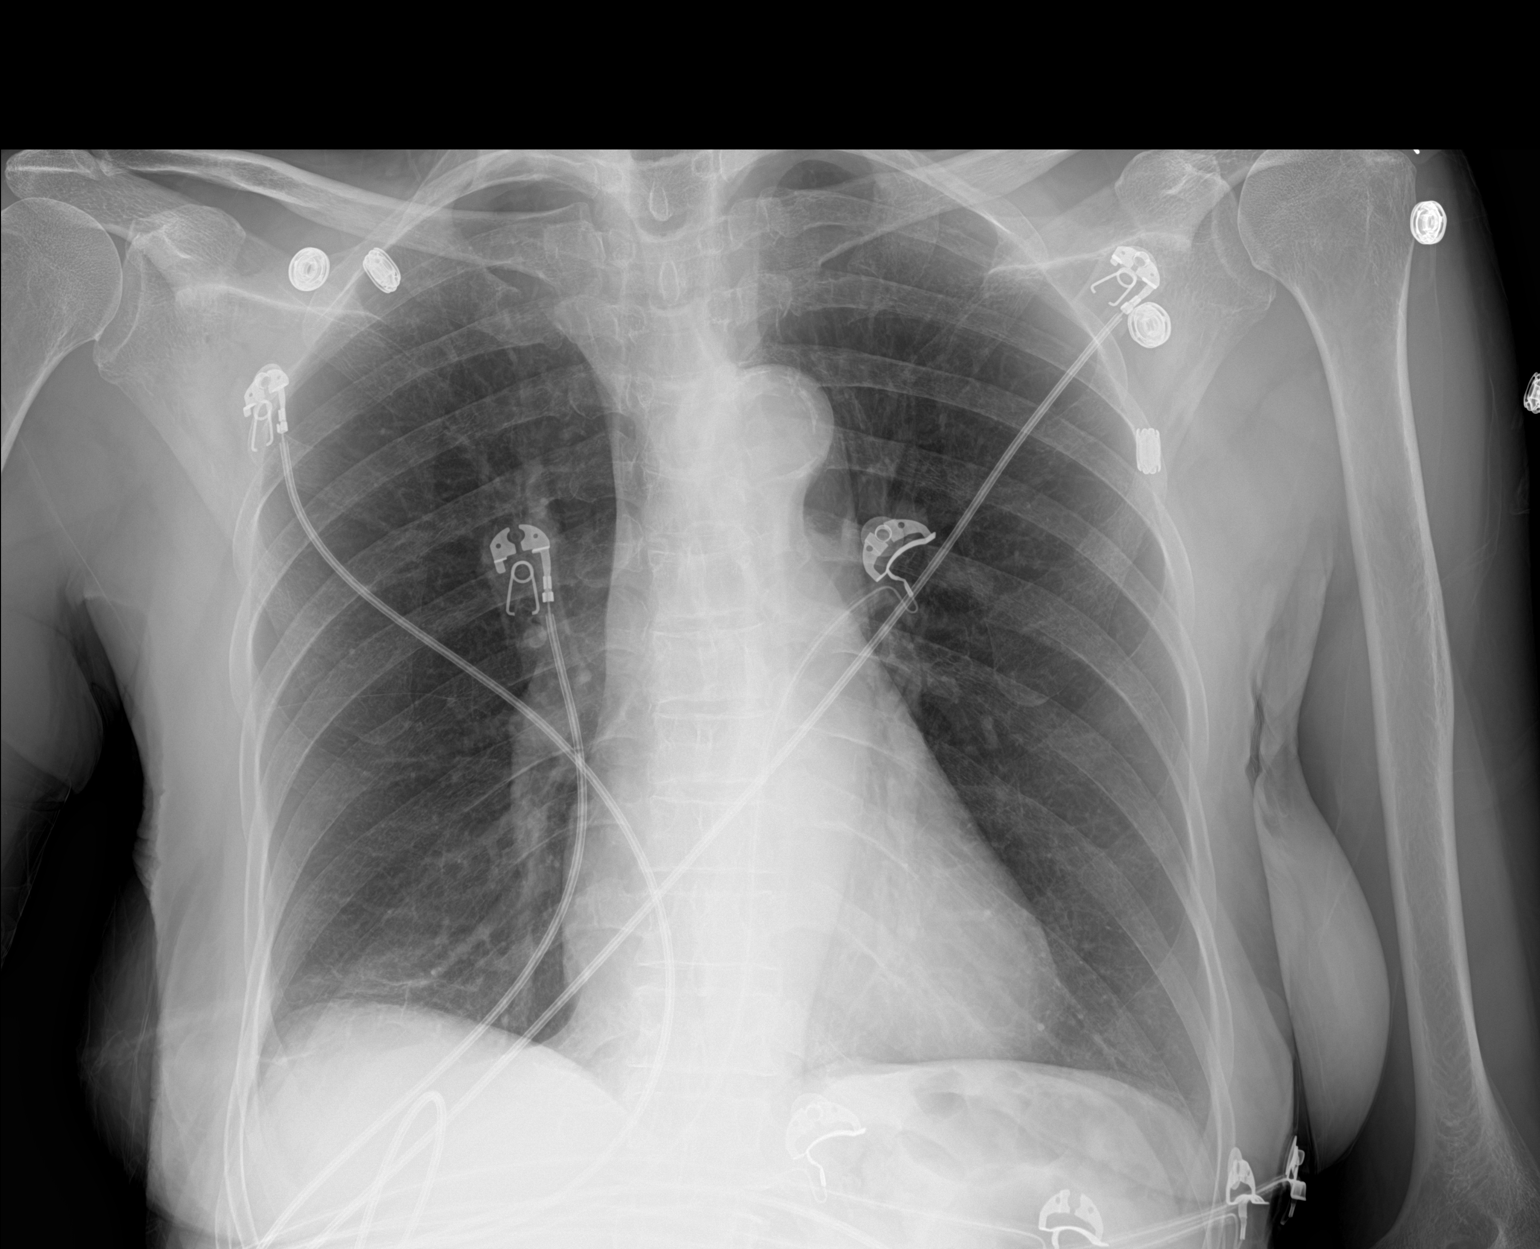

[1 of 1 positions shown; findings below may reference images not displayed]

FINDINGS: Lung volumes are normal. No consolidative airspace disease. No
pleural effusions. No pneumothorax. No pulmonary nodule or mass
noted. Pulmonary vasculature and the cardiomediastinal silhouette
are within normal limits. Atherosclerosis in the thoracic aorta.
IMPRESSION: 1.  No radiographic evidence of acute cardiopulmonary disease.
2. Aortic atherosclerosis.

## 2023-02-15 ENCOUNTER — Other Ambulatory Visit: Payer: Self-pay | Admitting: Internal Medicine

## 2023-02-15 ENCOUNTER — Ambulatory Visit: Payer: 59 | Admitting: Internal Medicine

## 2023-02-15 VITALS — BP 163/77 | HR 57 | Temp 97.8°F | Ht 67.0 in | Wt 144.8 lb

## 2023-02-15 DIAGNOSIS — R0789 Other chest pain: Secondary | ICD-10-CM

## 2023-02-15 DIAGNOSIS — E7841 Elevated Lipoprotein(a): Secondary | ICD-10-CM | POA: Diagnosis not present

## 2023-02-15 DIAGNOSIS — I10 Essential (primary) hypertension: Secondary | ICD-10-CM | POA: Diagnosis not present

## 2023-02-15 DIAGNOSIS — K5909 Other constipation: Secondary | ICD-10-CM

## 2023-02-15 DIAGNOSIS — M1711 Unilateral primary osteoarthritis, right knee: Secondary | ICD-10-CM | POA: Diagnosis not present

## 2023-02-15 DIAGNOSIS — M159 Polyosteoarthritis, unspecified: Secondary | ICD-10-CM

## 2023-02-15 DIAGNOSIS — E785 Hyperlipidemia, unspecified: Secondary | ICD-10-CM | POA: Diagnosis not present

## 2023-02-15 DIAGNOSIS — Z8639 Personal history of other endocrine, nutritional and metabolic disease: Secondary | ICD-10-CM

## 2023-02-15 DIAGNOSIS — J4520 Mild intermittent asthma, uncomplicated: Secondary | ICD-10-CM | POA: Diagnosis not present

## 2023-02-15 DIAGNOSIS — E559 Vitamin D deficiency, unspecified: Secondary | ICD-10-CM | POA: Diagnosis not present

## 2023-02-15 DIAGNOSIS — M150 Primary generalized (osteo)arthritis: Secondary | ICD-10-CM

## 2023-02-15 DIAGNOSIS — R4589 Other symptoms and signs involving emotional state: Secondary | ICD-10-CM

## 2023-02-15 NOTE — Assessment & Plan Note (Addendum)
Her main concern today is her uncontrolled blood pressure. She feels that no matter what she does, it continues to be elevated. She associates this elevation with a lot of stress going on in her life. 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 Her partner of 35 years also has a tracheostomy and COPD, for which she helps to take care of him as well. She expresses stress and frustration around a lack of intimacy in their relationship due to his conditions. She believes that these stressors are what are keeping her blood pressure elevated. She identifies potential for blood pressure regulation, as she recorded her BP on 01/28/23 and it was 132/60, and she was very happy with this. She has occasional headaches, that she both associates with stress and allergies.   Will schedule a BP lab visit in 2 weeks. Will have patient call once she is home to understand how she is taking her medication regimen.   Will refer to Ms. Christen Butter for introduction to counseling. Will also give information about [facility] for increased daily activities to help reduce stress-induced BP elevation.

## 2023-02-15 NOTE — Assessment & Plan Note (Signed)
Patient has a history of vitamin D deficiency. Will recheck Vitamin D in context of osteoporosis.

## 2023-02-15 NOTE — Assessment & Plan Note (Addendum)
Today she feels her chest pain is much improved by spacing out taking her medications. She relays that she takes 8 pills in total each day. She takes 4 of her blood pressure pills spaced out in the mornings (losartan, metoprolol, and 2 spironolactone)  In regard to her other medications, she is unsure of the names and dosing that she is taking currently, but will call once she is home to give the names, dosing, and frequency that she is taking her medications.   Will continue to monitor.

## 2023-02-15 NOTE — Assessment & Plan Note (Addendum)
Patient identifies pain as occasionally at the end of the day and not limiting her daily activity. On joint exam today, no synovial fluid seems to be present.   Will continue to have close follow up and recommend conservative management such as NSAIDs. If joint pain becomes consistent and inhibits daily activities, will discuss option of steroid injection.

## 2023-02-15 NOTE — Assessment & Plan Note (Addendum)
Statin dose was adjusted at previous visit, will recheck lipids today.

## 2023-02-15 NOTE — Assessment & Plan Note (Signed)
She feels that no matter what she does, it her blood pressure continues to be elevated. She associates this elevation with a lot of stress going on in her life. 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 Her partner of 35 years also has a tracheostomy and COPD, for which she helps to take care of him as well. She expresses stress and frustration around a lack of intimacy in their relationship due to his conditions. She believes that these stressors are what are keeping her blood pressure elevated. She identifies potential for blood pressure regulation, as she recorded her BP on 01/28/23 and it was 132/60, and she was very happy with this. She has occasional headaches, that she both associates with stress and allergies.   Will send referral for [name] for introduction to counseling. Will relay resources for [employer] for increased daily activity.

## 2023-02-15 NOTE — Assessment & Plan Note (Signed)
Her SOB and cough have improved during this time since her ED visit. She relays she is taking her Spiriva, Flovent, and rescue inhaler as prescribed and is tolerating them well.  Will continue to monitor.

## 2023-02-15 NOTE — Progress Notes (Signed)
This is a Psychologist, occupational Note.  The care of the patient was discussed with  Dr. [name] and the assessment and plan was formulated with their assistance.  Please see their note for official documentation of the patient encounter.   Subjective:   Patient ID: [name] female   DOB: 17-Mar-1951 72 y.o.   MRN: [id number]  HPI: [name] is a 72 y.o. who presents today for an ED follow up for concerns of cough, SOB, and chest pain. At the ED, there were no concerns for ACS, PE, dissection, AAA, pericarditis, myocarditis, esophagitis, pneumothorax, fluid overload, intraabdominal etiology. Since she identified the pain occurring after taking 8 of her medications at once, she was advised to space out taking her medications during the day. She was identified to have possible pill fatigue from taking multiple medications.    Her main concern today is her uncontrolled blood pressure. She feels that no matter what she does, it continues to be elevated. She associates this elevation with a lot of stress going on in her life. 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 Her partner of 35 years also has a tracheostomy and COPD, for which she helps to take care of him as well. She expresses stress and frustration around a lack of intimacy in their relationship due to his conditions. She believes that these stressors are what are keeping her blood pressure elevated. She identifies potential for blood pressure regulation, as she recorded her BP on 01/28/23 and it was 132/60, and she was very happy with this. She has occasional headaches, that she both associates with stress and allergies.   In her daily routine, she watches TV shows like the Young and the Restless and enjoys cooking foods such as oxtail, collard greens, and cornbread; she also enjoys cooking for friends. She enjoys going on walks and walking through downtown [place]. She feels she gets  enough sleep, but does sometimes have her sleep interrupted by outside noises.  For the details of today's visit, please refer to the assessment and plan.    Current Outpatient Medications  Medication Sig Dispense Refill   alendronate (FOSAMAX) 70 MG tablet Take 1 tablet (70 mg total) by mouth every 7 (seven) days. Take with a full glass of water on an empty stomach. 12 tablet 3   atorvastatin (LIPITOR) 40 MG tablet Take 1 tablet (40 mg total) by mouth daily. 90 tablet 3   cetirizine (ZYRTEC) 10 MG tablet Take 10 mg by mouth daily.     diclofenac Sodium (VOLTAREN) 1 % GEL APPLY 2 GRAMS TO AFFECTED AREA FOUR TIMES DAILY 100 g 3   ezetimibe (ZETIA) 10 MG tablet Take 1 tablet (10 mg total) by mouth daily. 30 tablet 11   fluticasone (FLONASE) 50 MCG/ACT nasal spray Place 1 spray into both nostrils daily. 15.8 mL 2   fluticasone (FLOVENT HFA) 110 MCG/ACT inhaler Inhale 1 puff into the lungs daily. Inhale 1 puff into the lungs daily. 1 each 12   HYDROcodone-acetaminophen (NORCO/VICODIN) 5-325 MG tablet 1/2 to 1 tablet only when needed for severe pain, as often as every 6 hours. 15 tablet 0   levalbuterol (XOPENEX HFA) 45 MCG/ACT inhaler Inhale 1 puff into the lungs every 6 (six) hours as needed for shortness of breath. 1 each 5   linaclotide (LINZESS) 145 MCG CAPS capsule Take 1 capsule (145 mcg total) by mouth daily before breakfast. 30 capsule 11  losartan (COZAAR) 100 MG tablet Take 1 tablet (100 mg total) by mouth daily. 90 tablet 3   metoprolol succinate (TOPROL-XL) 50 MG 24 hr tablet Take 1 tablet (50 mg total) by mouth daily. Take with or immediately following a meal. 30 tablet 5   montelukast (SINGULAIR) 10 MG tablet Take 1 tablet (10 mg total) by mouth at bedtime. 90 tablet 3   omeprazole (PRILOSEC) 40 MG capsule Take 1 capsule (40 mg total) by mouth in the morning and at bedtime. 90 capsule 3   polyethylene glycol (MIRALAX / GLYCOLAX) 17 g packet Take 17 g by mouth 2 (two) times daily. 14  each 0   potassium chloride (KLOR-CON M) 10 MEQ tablet Take 1 tablet (10 mEq total) by mouth daily. 60 tablet 0   rivaroxaban (XARELTO) 20 MG TABS tablet Take 1 tablet (20 mg total) by mouth daily with supper. 30 tablet 11   senna-docusate (SENNA-S) 8.6-50 MG tablet Take 1 tablet by mouth 2 (two) times daily. Or as directed by physician. 60 tablet 3   spironolactone (ALDACTONE) 25 MG tablet Take 2 tablets (50 mg total) by mouth daily. For blood pressure. 60 tablet 11   tiotropium (SPIRIVA) 18 MCG inhalation capsule Place 1 capsule (18 mcg total) into inhaler and inhale daily. 90 capsule 3   No current facility-administered medications for this visit.    Review of Systems: Pertinent items are noted in HPI. Objective:  Physical Exam: Vitals:   02/15/23 0843 02/15/23 0904  BP: (!) 168/89 (!) 163/77  Pulse: 63 (!) 57  Temp: 97.8 F (36.6 C)   TempSrc: Oral   SpO2: 100%   Weight: 144 lb 12.8 oz (65.7 kg)   Height: 5\' 7"  (1.702 m)   Patient relays she took her blood pressure medications this morning, but does not remember their names   Constitutional: NAD, appears comfortable. Normal body habitus, well-dressed.  Cardiovascular: Irregularly irregular rhythm, normal rate. I did not hear murmurs over the aortic or pulmonic posts.  Pulmonary/Chest: CTAB, I heard normal breath sounds.  Psychiatric: Normal mood and affect Knee: No effusion present at knees. No joint line tenderness on knees.   Assessment & Plan:   Non-cardiac chest pain Today she feels her chest pain is much improved by spacing out taking her medications. She relays that she takes 8 pills in total each day. She takes 4 of her blood pressure pills spaced out in the mornings (losartan, metoprolol, and 2 spironolactone)  In regard to her other medications, she is unsure of the names and dosing that she is taking currently, but will call once she is home to give the names, dosing, and frequency that she is taking her  medications.   Will continue to monitor.   Asthma in adult, mild intermittent, uncomplicated Her SOB and cough have improved during this time since her ED visit. She relays she is taking her Spiriva, Flovent, and rescue inhaler as prescribed and is tolerating them well.  Will continue to monitor.   Hypertension Her main concern today is her uncontrolled blood pressure. She feels that no matter what she does, it continues to be elevated. She associates this elevation with a lot of stress going on in her life. Her brother is 54 years old and is facing health challenges, for which she is his care taker. Her partner of 35 years also has a tracheostomy and COPD, for which she helps to take care of him as well. She expresses stress and frustration around a  lack of intimacy in their relationship due to his conditions. She believes that these stressors are what are keeping her blood pressure elevated. She identifies potential for blood pressure regulation, as she recorded her BP on 01/28/23 and it was 132/60, and she was very happy with this. She has occasional headaches, that she both associates with stress and allergies.   Will schedule a BP lab visit in 2 weeks. Will have patient call once she is home to understand how she is taking her medication regimen.   Will refer to Ms. Christen Butter for introduction to counseling. Will also give information about [facility] for increased daily activities to help reduce stress-induced BP elevation.   Anxiety about health She feels that no matter what she does, it her blood pressure continues to be elevated. She associates this elevation with a lot of stress going on in her life. Her brother is 20 years old and is facing health challenges, for which she is his care taker. Her partner of 35 years also has a tracheostomy and COPD, for which she helps to take care of him as well. She expresses stress and frustration around a lack of intimacy in their  relationship due to his conditions. She believes that these stressors are what are keeping her blood pressure elevated. She identifies potential for blood pressure regulation, as she recorded her BP on 01/28/23 and it was 132/60, and she was very happy with this. She has occasional headaches, that she both associates with stress and allergies.   Will send referral for Ms. Christen Butter for introduction to counseling. Will relay resources for [employer] for increased daily activity.   Chronic constipation She feels that her constipation has resolved on her current medication regimen. Will continue to monitor.   Primary osteoarthritis involving multiple joints, primarily L knee Patient identifies pain as occasionally at the end of the day and not limiting her daily activity. On joint exam today, no synovial fluid seems to be present.   Will continue to have close follow up and recommend conservative management such as NSAIDs. If joint pain becomes consistent and inhibits daily activities, will discuss option of steroid injection.   Vitamin D deficiency Patient has a history of vitamin D deficiency. Will recheck Vitamin D in context of osteoporosis.   Hyperlipidemia Statin dose was adjusted at previous visit, will recheck lipids today.

## 2023-02-15 NOTE — Patient Instructions (Addendum)
   Ms. Dalporto, Thank you for coming in today and talking through some of your stressors.  I have referred you to Ms. Christen Butter who will call to introduce herself and offer her services as a therapist.  Please check out the fun to be had at the [facility]!  Maybe your partner would enjoy it as well.    CALL the OFFICE today to review your medicines with the triage nurse so that we are all on the same page.    Take care of yourself!  Dr. [name]

## 2023-02-15 NOTE — Assessment & Plan Note (Signed)
She feels that her constipation has resolved on her current medication regimen. Will continue to monitor.

## 2023-02-16 ENCOUNTER — Encounter: Payer: Self-pay | Admitting: Internal Medicine

## 2023-02-16 ENCOUNTER — Ambulatory Visit (INDEPENDENT_AMBULATORY_CARE_PROVIDER_SITE_OTHER): Payer: 59 | Admitting: Licensed Clinical Social Worker

## 2023-02-16 DIAGNOSIS — F1210 Cannabis abuse, uncomplicated: Secondary | ICD-10-CM

## 2023-02-16 LAB — BMP8+ANION GAP
Anion Gap: 14 mmol/L (ref 10.0–18.0)
BUN/Creatinine Ratio: 24 (ref 12–28)
BUN: 17 mg/dL (ref 8–27)
CO2: 25 mmol/L (ref 20–29)
Calcium: 10 mg/dL (ref 8.7–10.3)
Chloride: 103 mmol/L (ref 96–106)
Creatinine, Ser: 0.7 mg/dL (ref 0.57–1.00)
Glucose: 77 mg/dL (ref 70–99)
Potassium: 4 mmol/L (ref 3.5–5.2)
Sodium: 142 mmol/L (ref 134–144)
eGFR: 91 mL/min/{1.73_m2} (ref >59)

## 2023-02-16 LAB — VITAMIN D 25 HYDROXY (VIT D DEFICIENCY, FRACTURES): Vit D, 25-Hydroxy: 22.4 ng/mL — ABNORMAL LOW (ref 30.0–100.0)

## 2023-02-16 LAB — LIPID PANEL
Chol/HDL Ratio: 2.6 ratio (ref 0.0–4.4)
Cholesterol, Total: 247 mg/dL — ABNORMAL HIGH (ref 100–199)
HDL: 95 mg/dL (ref >39)
LDL Chol Calc (NIH): 135 mg/dL — ABNORMAL HIGH (ref 0–99)
Triglycerides: 102 mg/dL (ref 0–149)
VLDL Cholesterol Cal: 17 mg/dL (ref 5–40)

## 2023-02-16 NOTE — BH Specialist Note (Signed)
[facility] spoke to patient on today and introduced self. [facility] explained role and benefits of therapist. [facility] also informed patient about social work resources. Patient denied needing resources or therapy.   Patient advised she is doing well. 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 [facility] inquired if patient needing anything regarding her brother or emotional support. Patient denied.   [facility] recommended patient to contact [facility] if anything changes or if patients needs assistance. Patient agreed.   Christen Butter, MSW, LCSW-A She/Her Behavioral Health Clinician [facility]  Internal Medicine Center Direct Dial:[phone]  Fax [phone] Main Office Phone: [phone] [address] [place] [place], [place] [address] Website: [facility] Internal Medicine [facility]  [place], [place]  [place]

## 2023-02-17 ENCOUNTER — Telehealth: Payer: Self-pay

## 2023-02-17 DIAGNOSIS — S36039D Unspecified laceration of spleen, subsequent encounter: Secondary | ICD-10-CM

## 2023-02-17 NOTE — Telephone Encounter (Signed)
Return pt's call who stated she's having back pain ; stated "it hasn't hurt in a while, must be b/c it's raining".  Stated it started this morning. Rate pain #9 out of 10. Stated she has taken Hydrocodone. Thanks

## 2023-02-17 NOTE — Telephone Encounter (Signed)
Pt states she is having back pain and requesting medication for this. Please call pt back.

## 2023-02-19 ENCOUNTER — Other Ambulatory Visit: Payer: Self-pay | Admitting: Family Medicine

## 2023-02-19 DIAGNOSIS — K219 Gastro-esophageal reflux disease without esophagitis: Secondary | ICD-10-CM

## 2023-02-22 ENCOUNTER — Other Ambulatory Visit: Payer: Self-pay | Admitting: Internal Medicine

## 2023-02-22 ENCOUNTER — Ambulatory Visit (HOSPITAL_COMMUNITY): Payer: 59 | Admitting: Internal Medicine

## 2023-02-22 DIAGNOSIS — K219 Gastro-esophageal reflux disease without esophagitis: Secondary | ICD-10-CM

## 2023-03-01 ENCOUNTER — Other Ambulatory Visit: Payer: Self-pay

## 2023-03-01 DIAGNOSIS — K5909 Other constipation: Secondary | ICD-10-CM

## 2023-03-01 NOTE — Progress Notes (Signed)
Visit was conducted jointly with student doctor Pamala Hurry.  I agree with her documentation association with this visit.

## 2023-03-02 ENCOUNTER — Ambulatory Visit: Payer: 59

## 2023-03-02 ENCOUNTER — Telehealth: Payer: Self-pay | Admitting: *Deleted

## 2023-03-02 NOTE — Telephone Encounter (Signed)
Patient called in to cancel today's nurse visit for BP recheck as she has a family emergency. She took BP meds at 0715 this AM and checked BP at 0810 157/87. She will call back after rechecking BP at 0915.

## 2023-03-02 NOTE — Telephone Encounter (Signed)
Patient called back with BP reading at 0920 160/72. States her 74 yo brother passed at 0500 this AM. States she is very stressed which has caused a h/a. She is very happy with her BP today. States SBP last week was 143. She will call back next week to reschedule nurse visit.

## 2023-03-19 NOTE — Progress Notes (Signed)
She is unsure if she is taking statin - result suggests she is not.  Med adherence challenging - easily confused.  Will move toward adherence packaging.

## 2023-04-11 ENCOUNTER — Other Ambulatory Visit: Payer: Self-pay

## 2023-04-11 ENCOUNTER — Encounter (HOSPITAL_COMMUNITY): Payer: Self-pay

## 2023-04-11 ENCOUNTER — Emergency Department (HOSPITAL_COMMUNITY)
Admission: EM | Admit: 2023-04-11 | Discharge: 2023-04-11 | Disposition: A | Discharge status: Home / Self Care | Payer: 59 | Attending: Emergency Medicine | Admitting: Emergency Medicine

## 2023-04-11 ENCOUNTER — Emergency Department (HOSPITAL_COMMUNITY): Payer: 59

## 2023-04-11 DIAGNOSIS — M438X6 Other specified deforming dorsopathies, lumbar region: Secondary | ICD-10-CM | POA: Diagnosis not present

## 2023-04-11 DIAGNOSIS — Z8616 Personal history of COVID-19: Secondary | ICD-10-CM | POA: Insufficient documentation

## 2023-04-11 DIAGNOSIS — I10 Essential (primary) hypertension: Secondary | ICD-10-CM | POA: Insufficient documentation

## 2023-04-11 DIAGNOSIS — Z87442 Personal history of urinary calculi: Secondary | ICD-10-CM | POA: Diagnosis not present

## 2023-04-11 DIAGNOSIS — R1030 Lower abdominal pain, unspecified: Secondary | ICD-10-CM | POA: Diagnosis not present

## 2023-04-11 DIAGNOSIS — J45909 Unspecified asthma, uncomplicated: Secondary | ICD-10-CM | POA: Diagnosis not present

## 2023-04-11 DIAGNOSIS — M545 Low back pain, unspecified: Secondary | ICD-10-CM | POA: Insufficient documentation

## 2023-04-11 DIAGNOSIS — M47816 Spondylosis without myelopathy or radiculopathy, lumbar region: Secondary | ICD-10-CM | POA: Diagnosis not present

## 2023-04-11 DIAGNOSIS — R109 Unspecified abdominal pain: Secondary | ICD-10-CM

## 2023-04-11 DIAGNOSIS — M5136 Other intervertebral disc degeneration, lumbar region: Secondary | ICD-10-CM | POA: Diagnosis not present

## 2023-04-11 DIAGNOSIS — K7689 Other specified diseases of liver: Secondary | ICD-10-CM | POA: Diagnosis not present

## 2023-04-11 LAB — COMPREHENSIVE METABOLIC PANEL
ALT: 18 U/L (ref 0–44)
AST: 21 U/L (ref 15–41)
Albumin: 3.8 g/dL (ref 3.5–5.0)
Alkaline Phosphatase: 54 U/L (ref 38–126)
Anion gap: 12 (ref 5–15)
BUN: 9 mg/dL (ref 8–23)
CO2: 20 mmol/L — ABNORMAL LOW (ref 22–32)
Calcium: 9.1 mg/dL (ref 8.9–10.3)
Chloride: 107 mmol/L (ref 98–111)
Creatinine, Ser: 0.82 mg/dL (ref 0.44–1.00)
GFR, Estimated: >60 mL/min (ref >60)
Glucose, Bld: 84 mg/dL (ref 70–99)
Potassium: 3.4 mmol/L — ABNORMAL LOW (ref 3.5–5.1)
Sodium: 139 mmol/L (ref 135–145)
Total Bilirubin: 1.1 mg/dL (ref 0.3–1.2)
Total Protein: 7.2 g/dL (ref 6.5–8.1)

## 2023-04-11 LAB — URINALYSIS, ROUTINE W REFLEX MICROSCOPIC
Bacteria, UA: NONE SEEN
Bilirubin Urine: NEGATIVE
Glucose, UA: NEGATIVE mg/dL
Ketones, ur: NEGATIVE mg/dL
Leukocytes,Ua: NEGATIVE
Nitrite: NEGATIVE
Protein, ur: NEGATIVE mg/dL
Specific Gravity, Urine: 1.009 (ref 1.005–1.030)
pH: 7 (ref 5.0–8.0)

## 2023-04-11 LAB — CBC
HCT: 34.8 % — ABNORMAL LOW (ref 36.0–46.0)
Hemoglobin: 11.3 g/dL — ABNORMAL LOW (ref 12.0–15.0)
MCH: 26.5 pg (ref 26.0–34.0)
MCHC: 32.5 g/dL (ref 30.0–36.0)
MCV: 81.7 fL (ref 80.0–100.0)
Platelets: 188 10*3/uL (ref 150–400)
RBC: 4.26 MIL/uL (ref 3.87–5.11)
RDW: 15.7 % — ABNORMAL HIGH (ref 11.5–15.5)
WBC: 3.7 10*3/uL — ABNORMAL LOW (ref 4.0–10.5)
nRBC: 0 % (ref 0.0–0.2)

## 2023-04-11 LAB — LIPASE, BLOOD: Lipase: 23 U/L (ref 11–51)

## 2023-04-11 MED ORDER — IOHEXOL 350 MG/ML SOLN
75.0000 mL | Freq: Once | INTRAVENOUS | Status: AC | PRN
  Administered 2023-04-11: 75 mL via INTRAVENOUS

## 2023-04-11 MED ORDER — SIMETHICONE 80 MG PO CHEW
80.0000 mg | CHEWABLE_TABLET | Freq: Once | ORAL | Status: AC
  Administered 2023-04-11: 80 mg via ORAL
  Filled 2023-04-11: qty 1

## 2023-04-11 MED ORDER — METHOCARBAMOL 500 MG PO TABS
500.0000 mg | ORAL_TABLET | Freq: Once | ORAL | Status: AC
  Administered 2023-04-11: 500 mg via ORAL
  Filled 2023-04-11: qty 1

## 2023-04-11 MED ORDER — SIMETHICONE 80 MG PO CHEW: 80.0000 mg | CHEWABLE_TABLET | Freq: Four times a day (QID) | ORAL | 0 refills | Status: DC | PRN

## 2023-04-11 MED ORDER — DICYCLOMINE HCL 10 MG PO CAPS
10.0000 mg | ORAL_CAPSULE | Freq: Once | ORAL | Status: AC
  Administered 2023-04-11: 10 mg via ORAL
  Filled 2023-04-11: qty 1

## 2023-04-11 NOTE — ED Provider Notes (Signed)
Emergency Department Provider Note   I have reviewed the triage vital signs and the nursing notes.   HISTORY  Chief Complaint Back Pain   HPI [name] is a 72 y.o. female who presents to the emergency department with acute onset lower back pain radiating around to the lower abdomen.  Pain began this morning.  She felt weakness in the legs and had to have some assistance getting to the bathroom.  She had a large, diarrhea type bowel movement without blood.  She continues to have some soreness in her back and lower abdomen.  No dysuria, hesitancy, urgency.  No numbness in the legs.  Denies any pain into her chest or upper back.  No shortness of breath.   Past Medical History:  Diagnosis Date   Asthma in adult, mild intermittent, uncomplicated    Chronic anticoagulation    Chronic constipation    COVID-19 virus infection 10/19/2021   Eczema    GERD (gastroesophageal reflux disease)    H. pylori infection 09/24/2021   Identified on EGD biopsy.  Treatment will be discussed with her GI doctor.  SHe will need a very simple regimen which has no contraindications in combination with her existing medication regimen.     Hemoperitoneum 09/27/2021   Due to splenic lac occurring after colonoscopy on 09/24/21.  Xarelto resumed.    History of kidney stones    Hx of vaginal bleeding, resolved, ultrasound completed 10/11/2018   Hypertension    Laceration of spleen    Presumed complication of colonoscopy 09/24/21.  Pain and hemoperitoneum.  Xarelto resumed.  Short term opioid pain management.     Personal history of atrial fibrillation, s/p ablation, now in NSR but remains on anticoaulation 11/26/2015   Followed in cardiology clinic     Review of Systems  Constitutional: No fever/chills Cardiovascular: Denies chest pain. Respiratory: Denies shortness of breath. Gastrointestinal: Positive lower abdominal pain.  No nausea, no vomiting.  No diarrhea.  No constipation. Genitourinary: Negative  for dysuria. Musculoskeletal: Positive for back pain. Skin: Negative for rash. Neurological: Negative for headaches, focal weakness or numbness.   ____________________________________________   PHYSICAL EXAM:  VITAL SIGNS: ED Triage Vitals  Encounter Vitals Group     BP 04/11/23 0827 (!) 145/87     Pulse Rate 04/11/23 0827 69     Resp 04/11/23 0827 15     Temp 04/11/23 0827 98.1 F (36.7 C)     Temp Source 04/11/23 0827 Oral     SpO2 04/11/23 0827 99 %     Weight 04/11/23 0845 144 lb 13.5 oz (65.7 kg)     Height 04/11/23 0845 5\' 7"  (1.702 m)   Constitutional: Alert and oriented. Well appearing and in no acute distress. Eyes: Conjunctivae are normal.  Head: Atraumatic. Nose: No congestion/rhinnorhea. Mouth/Throat: Mucous membranes are moist.   Neck: No stridor.   Cardiovascular: Normal rate, regular rhythm. Good peripheral circulation. Grossly normal heart sounds.   Respiratory: Normal respiratory effort.  No retractions. Lungs CTAB. Gastrointestinal: Soft and nontender. No distention.  Musculoskeletal: No lower extremity tenderness nor edema. No gross deformities of extremities. Neurologic:  Normal speech and language. No gross focal neurologic deficits are appreciated. 5/5 strength in the bilateral LEs. Normal sensation. 2+ patellar reflexes.  Skin:  Skin is warm, dry and intact. No rash noted.  ____________________________________________   LABS (all labs ordered are listed, but only abnormal results are displayed)  Labs Reviewed  COMPREHENSIVE METABOLIC PANEL - Abnormal; Notable for the following  components:      Result Value   Potassium 3.4 (*)    CO2 20 (*)    All other components within normal limits  URINALYSIS, ROUTINE W REFLEX MICROSCOPIC - Abnormal; Notable for the following components:   Hgb urine dipstick SMALL (*)    All other components within normal limits  LIPASE, BLOOD  CBC  CBC   ____________________________________________  RADIOLOGY  DG  Lumbar Spine Complete  Result Date: 04/11/2023 CLINICAL DATA:  Several day history of lower back pain radiating into the bilateral lower quadrants EXAM: LUMBAR SPINE - COMPLETE 5 VIEW COMPARISON:  Lumbar spine radiograph dated 10/03/2020 FINDINGS: There is no evidence of lumbar spine fracture. Slightly increased mild S-shaped curvature of the lumbar spine. Multilevel degenerative changes with similar intervertebral disc space narrowing at L1-2 and L3-L5 and slightly increased multilevel facet arthropathy at L3-S1. Right upper quadrant surgical clips. Partially imaged vascular calcifications. IMPRESSION: 1. No acute fracture or traumatic listhesis. 2. Multilevel degenerative changes, slightly increased from prior. Electronically Signed   By: Agustin Cree M.D.   On: 04/11/2023 11:04    ____________________________________________   PROCEDURES  Procedure(s) performed:   Procedures  None ____________________________________________   INITIAL IMPRESSION / ASSESSMENT AND PLAN / ED COURSE  Pertinent labs & imaging results that were available during my care of the patient were reviewed by me and considered in my medical decision making (see chart for details).   This patient is Presenting for Evaluation of abdominal/back pain, which does require a range of treatment options, and is a complaint that involves a high risk of morbidity and mortality.  The Differential Diagnoses includes but is not exclusive to musculoskeletal back pain, renal colic, urinary tract infection, pyelonephritis, intra-abdominal causes of back pain, aortic aneurysm or dissection, cauda equina syndrome, sciatica, lumbar disc disease, thoracic disc disease, etc.   Critical Interventions-    Medications  methocarbamol (ROBAXIN) tablet 500 mg (has no administration in time range)    Reassessment after intervention: Pain symptoms improved.   Clinical Laboratory Tests Ordered, included   Radiologic Tests Ordered, included  ***. I independently interpreted the images and agree with radiology interpretation.   Cardiac Monitor Tracing which shows NSR.    Social Determinants of Health Risk patient is a non-smoker.   Consult complete with  Medical Decision Making: Summary:  Patient presents emergency department valuation of lower back pain radiating around to the anterior abdomen.  No appreciable tenderness on abdominal exam.  She has intact strength sensation the bilateral lower extremities.  Symptoms seem to be associate with a bowel movement.  Plan for CT abdomen pelvis along with labs and reassess.  Reevaluation with update and discussion with   ***Considered admission***  Patient's presentation is most consistent with acute presentation with potential threat to life or bodily function.   Disposition:   ____________________________________________  FINAL CLINICAL IMPRESSION(S) / ED DIAGNOSES  Final diagnoses:  None     NEW OUTPATIENT MEDICATIONS STARTED DURING THIS VISIT:  New Prescriptions   No medications on file    Note:  This document was prepared using Dragon voice recognition software and may include unintentional dictation errors.  Alona Bene, MD, [facility]

## 2023-04-11 NOTE — ED Notes (Signed)
To ed today forbackpain states onset 2days  also reports diarrehea ;;;;;;;;;;;;;;;;;;;;;;;;;;;;;;;;;;;;;;;;;;;;;;;;;;;;;;;;;;;;;;;;;;;;;;;;;;;;;;;;;;;;;;;;;;;;;;;;;;;;;;;;;;

## 2023-04-11 NOTE — Discharge Instructions (Addendum)
We evaluated you for your abdominal pain.  Your symptoms could be due to your recent episodes of diarrhea.  Please stop taking the laxatives for now.  I prescribed you a medicine called simethicone which she can take for your abdominal cramping.  Please follow-up with your primary doctor.  If your symptoms worsen, please return to the emergency department.

## 2023-04-11 NOTE — ED Triage Notes (Signed)
Pt c/o lower back pain that radiates to LRQ, LLQ of abdomenx2-3d. Pt states when she had a BM this morning her legs were hurting bad and she could barely walk on her legs and it felt like her left leg wanted to give out. Pt denies injury. Pt denies numbness and tingling. Pt states these sx only happen after a BM.

## 2023-04-11 NOTE — ED Provider Notes (Signed)
   ED Course / MDM   Clinical Course as of 04/11/23 1841  Mon Apr 11, 2023  1536 Received sign out from Dr. Jacqulyn Bath pending CT abdomen. Presenting with lower abdomen/back pain. Having bowel movements. No GI/GU symptoms. Likely stable for discharge after CT if no acute process.  [WS]  1739 CT abdomen negative for acute process.  Patient does report that she was having some diarrhea and abdominal cramping after taking a lot of laxatives.  This is possibly the cause of her symptoms.  Prescribed her simethicone.  Advised to stop laxatives for now and follow-up with primary doctor.  Her CBC apparently hemolyzed and was not recollected.  She is not having any black or tarry stools, lightheadedness, dizziness or other acute symptoms that I think would be related to her pain.  I do not think she needs recollection of her CBC at this time but advised follow-up with primary doctor. [WS]  1841 CBC is actually sent and completed.  It is reassuring. Will discharge patient to home. All questions answered. Patient comfortable with plan of discharge. Return precautions discussed with patient and specified on the after visit summary.  [WS]    Clinical Course User Index [WS] Lonell Grandchild, MD   Medical Decision Making Amount and/or Complexity of Data Reviewed Labs: ordered. Radiology: ordered.  Risk OTC drugs. Prescription drug management.          Lonell Grandchild, MD 04/11/23 [phone]

## 2023-04-11 NOTE — ED Notes (Signed)
ATTEMTPED IV  BUT UNSUCCESSFUL. PT HAS POOR VASCULATURE

## 2023-04-11 NOTE — ED Provider Notes (Signed)
MSE note.  Patient complains left flank pain and worse with ambulation.  She also states that she has been having a lot of bowel movements.  Physical exam patient in no acute distress lungs clear abdomen soft nontender.  Patient tender in the lumbar area in the right flank.  Labs and x-rays ordered   Bethann Berkshire, MD 04/11/23 [phone]

## 2023-04-19 ENCOUNTER — Telehealth: Payer: Self-pay | Admitting: *Deleted

## 2023-04-19 NOTE — Telephone Encounter (Signed)
Transition Care Management Unsuccessful Follow-up Telephone Call  Date of discharge and from where:  [facility] [place] 04/11/2023  Attempts:  1st Attempt  Reason for unsuccessful TCM follow-up call:  No answer/busy

## 2023-04-20 ENCOUNTER — Other Ambulatory Visit (HOSPITAL_COMMUNITY): Payer: Self-pay | Admitting: *Deleted

## 2023-04-21 ENCOUNTER — Telehealth: Payer: Self-pay | Admitting: *Deleted

## 2023-04-21 NOTE — Telephone Encounter (Signed)
Transition Care Management Unsuccessful Follow-up Telephone Call  Date of discharge and from where:  [facility] [place] [place]  04/12/2023  Attempts:  2nd Attempt  Reason for unsuccessful TCM follow-up call:  Left voice message

## 2023-05-01 IMAGING — CR DG CHEST 2V
2 series · 2 of 2 positions shown · non-contrast
Comparison: 10/28/2021

CLINICAL DATA: Shortness of breath.

EXAM:
CHEST - 2 VIEW

[w chest lat]
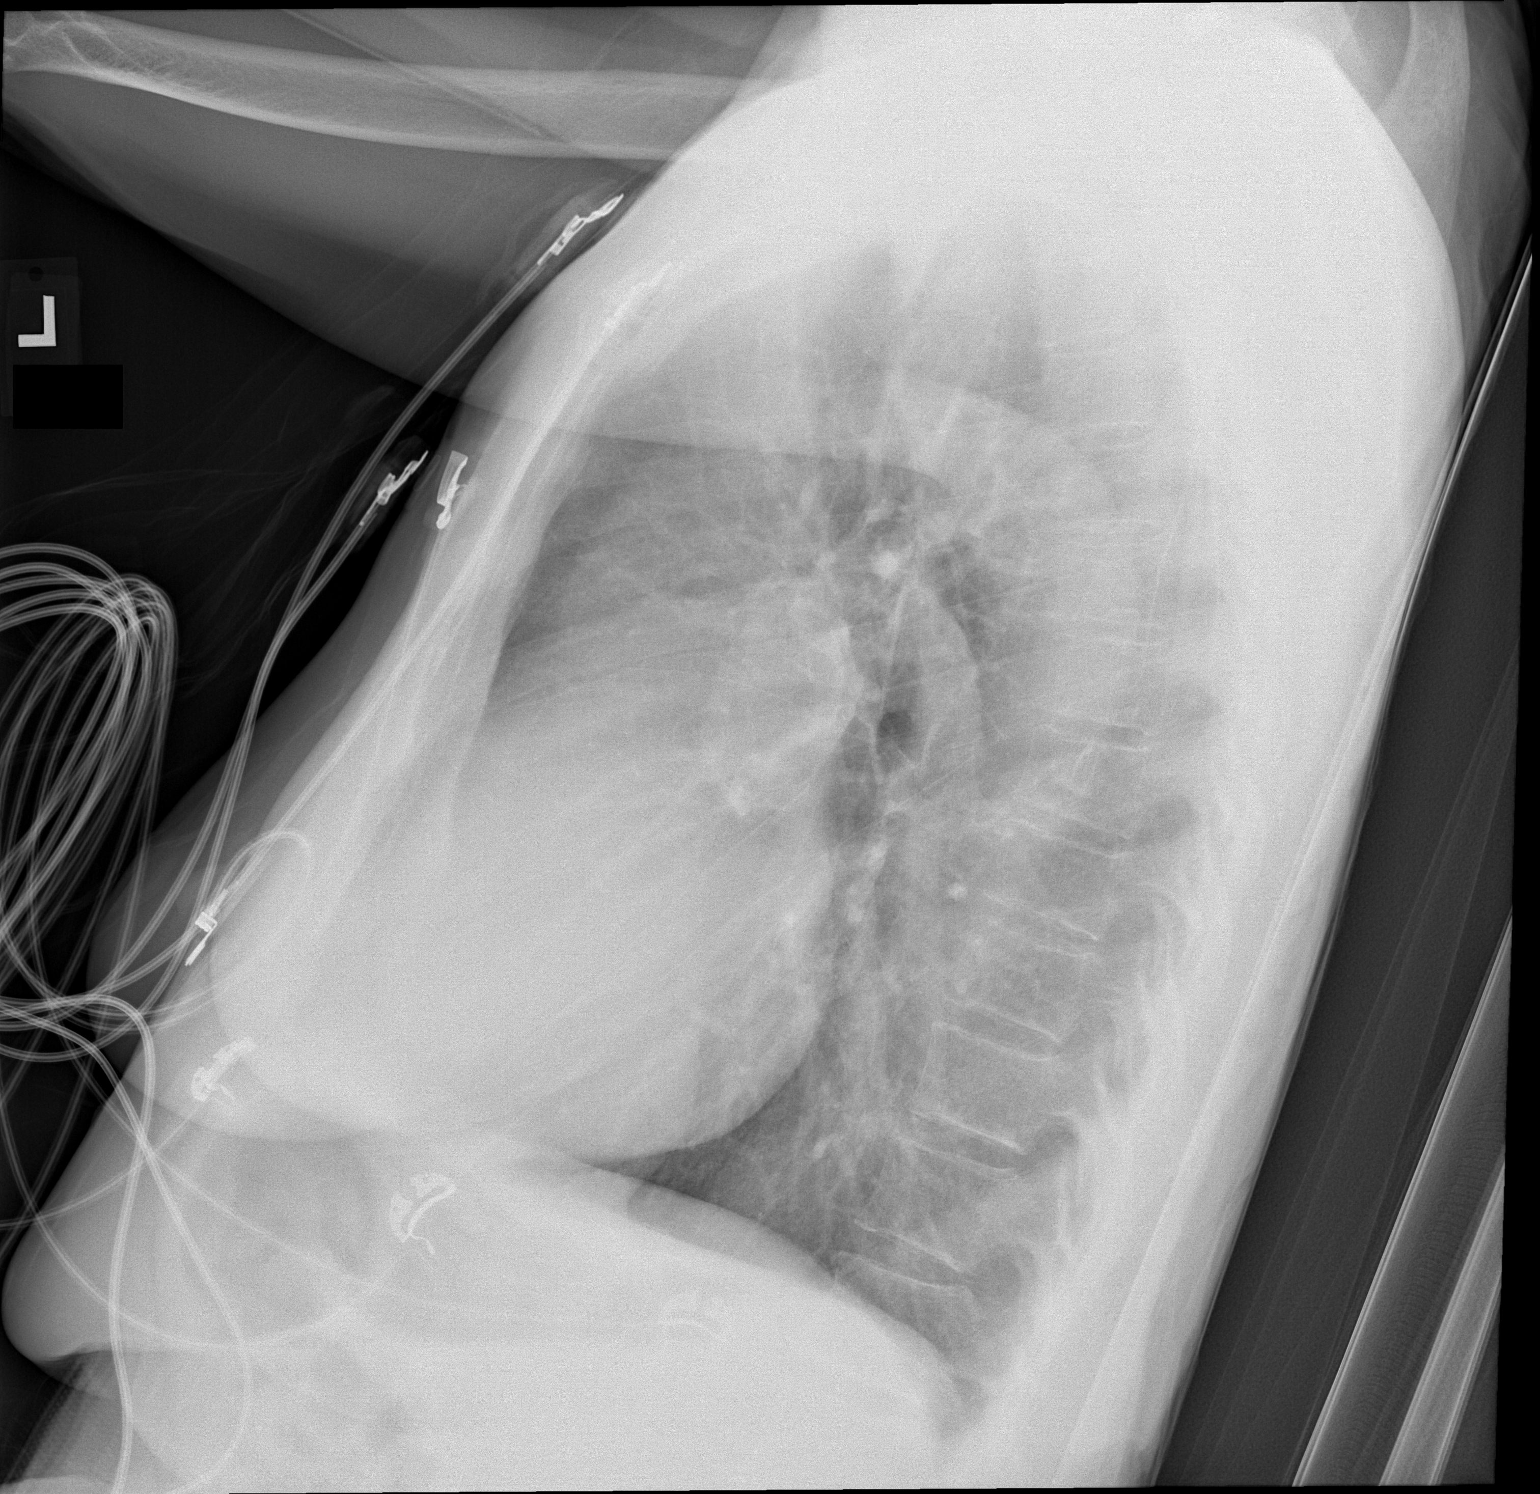

[x chest ap]
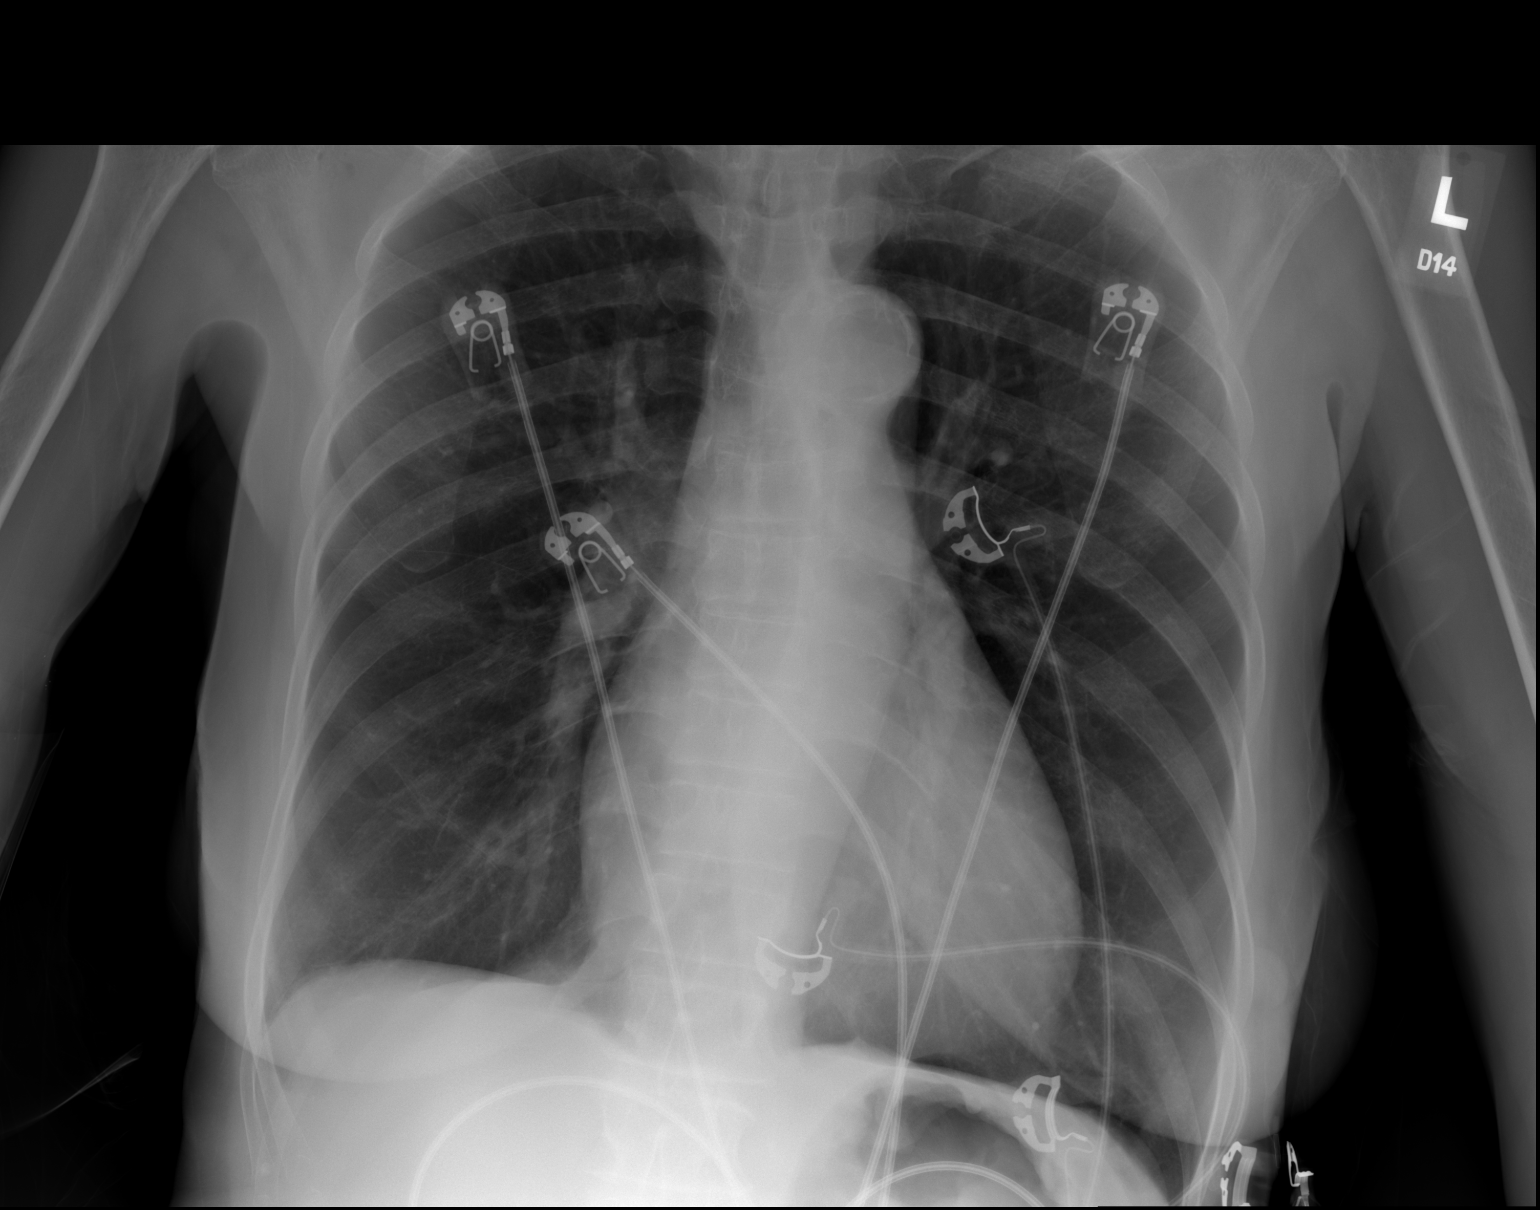

[2 of 2 positions shown; findings below may reference images not displayed]

FINDINGS: Lungs are hyperexpanded. Cardiopericardial silhouette is at upper
limits of normal for size. The lungs are clear without focal
pneumonia, edema, pneumothorax or pleural effusion. The visualized
bony structures of the thorax are unremarkable. Telemetry leads
overlie the chest.
IMPRESSION: No active cardiopulmonary disease.

## 2023-05-01 IMAGING — CT CT ABD-PELV W/ CM
4 of 5 series · 12 of 46 positions shown, 17 images · IV contrast (agent unspecified)
Comparison: CT abdomen and pelvis dated September 27, 2021; CT
abdomen and pelvis dated January 11, 2018

CLINICAL DATA: Left upper quadrant abdominal pain

EXAM:
CT ABDOMEN AND PELVIS WITH CONTRAST
TECHNIQUE: Multidetector CT imaging of the abdomen and pelvis was performed
using the standard protocol following bolus administration of
intravenous contrast.

[Series 2: axial st · axial · 0.79mm/px · z∈[+1169,+1469]mm · 6 of 84 slices shown, 11 images]
[im 12/84  soft-tissue]
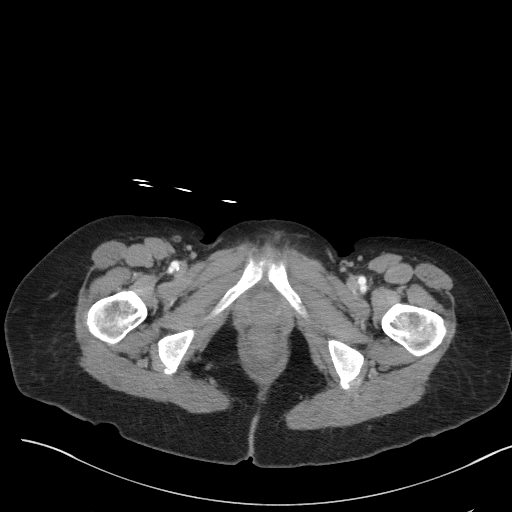
[im 12/84  bone]
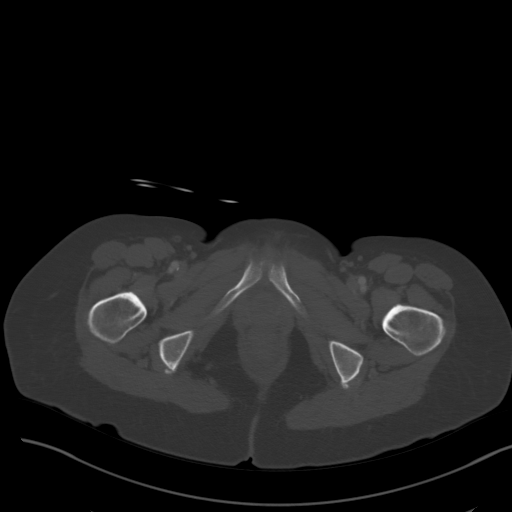
[im 24/84  soft-tissue]
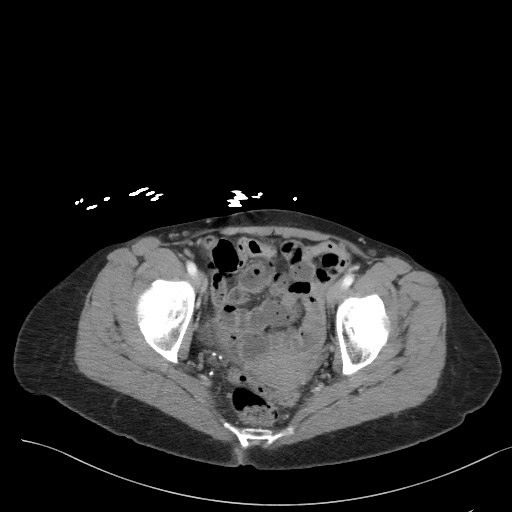
[im 36/84  soft-tissue]
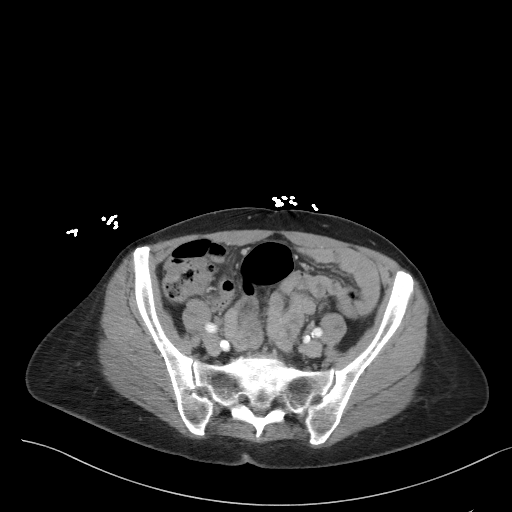
[im 36/84  lung]
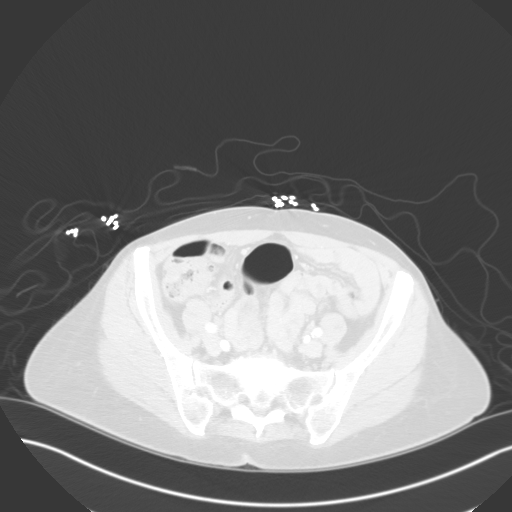
[im 48/84  soft-tissue]
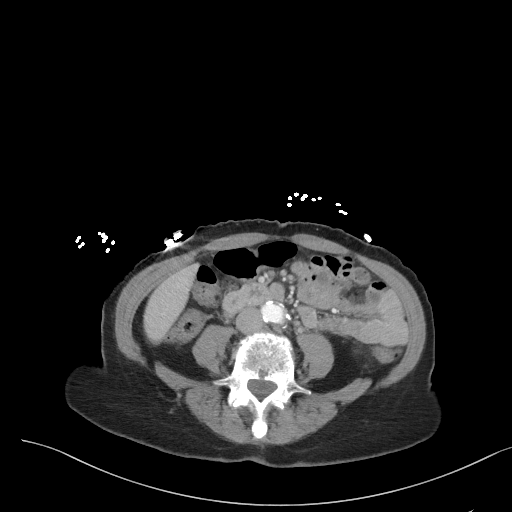
[im 48/84  lung]
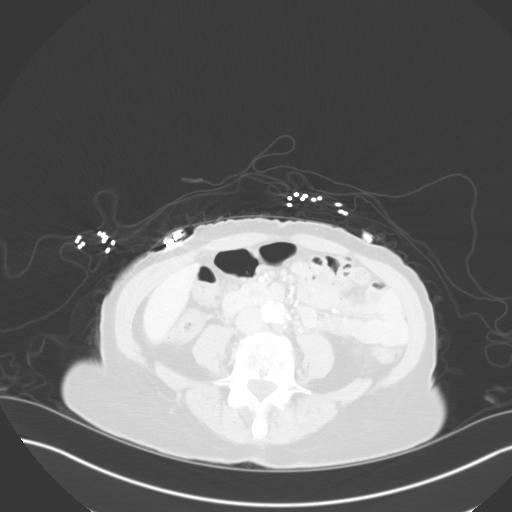
[im 60/84  soft-tissue]
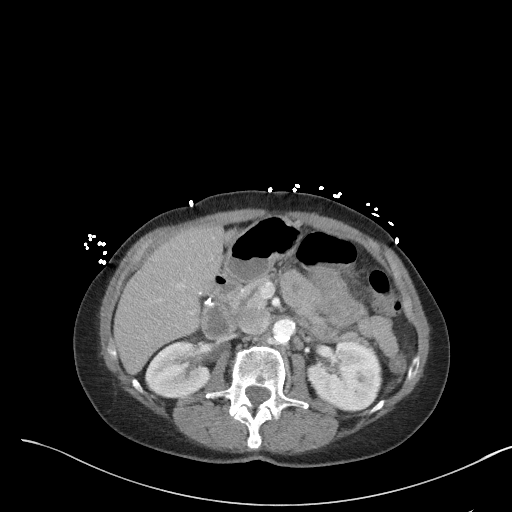
[im 60/84  lung]
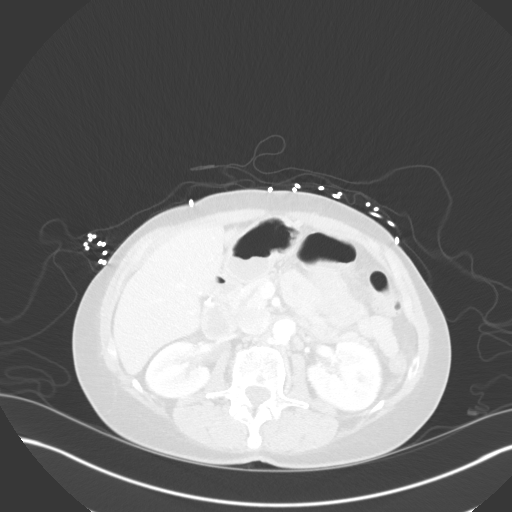
[im 72/84  soft-tissue]
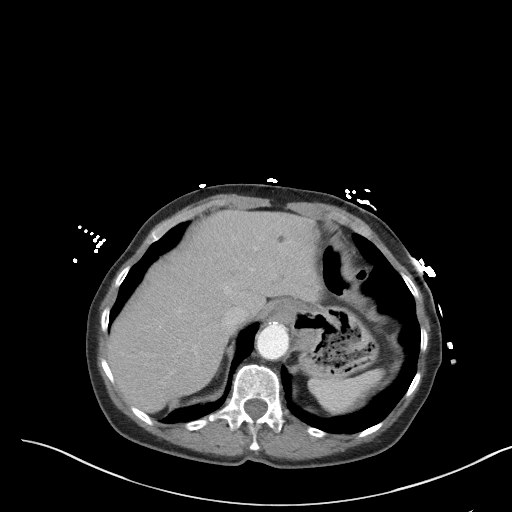
[im 72/84  lung]
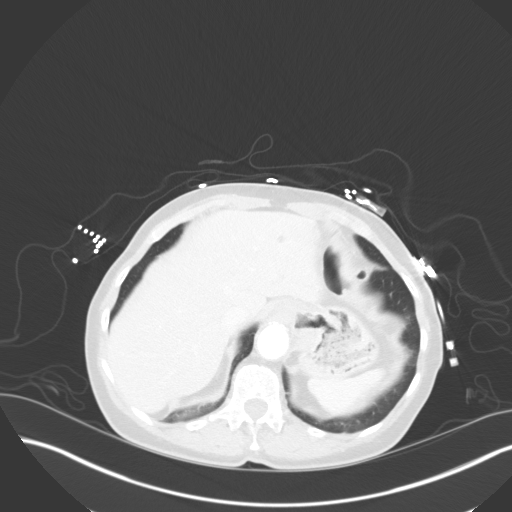

[Series 5: coronal st · coronal · 0.64mm/px · 3 of 86 slices shown]
[im 29/86  soft-tissue]
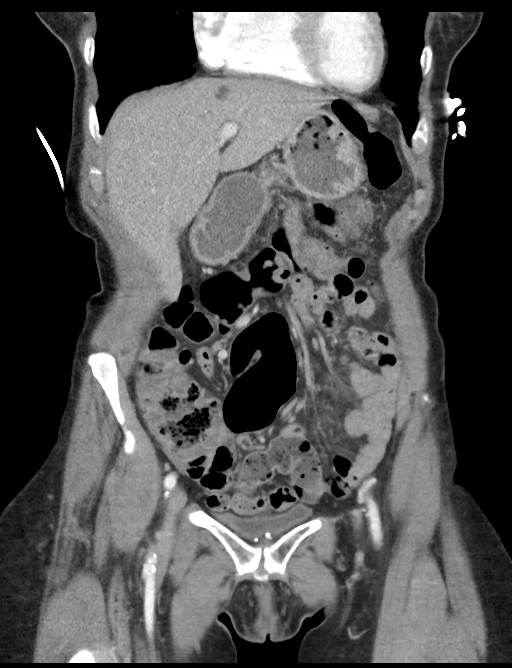
[im 38/86  soft-tissue]
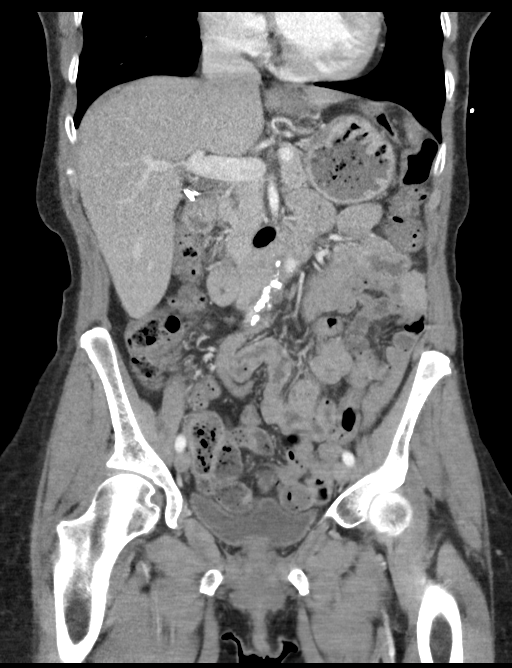
[im 48/86  soft-tissue]
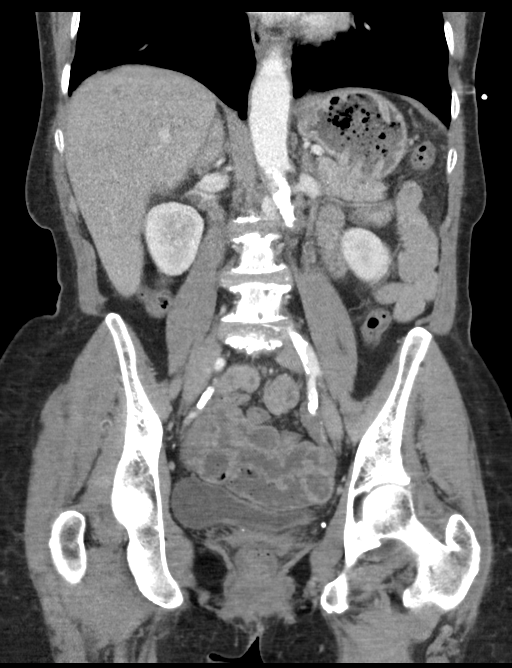

[Series 6: sagittal st · sagittal · 0.50mm/px · 1 of 110 slices shown]
[im 37/110  soft-tissue]
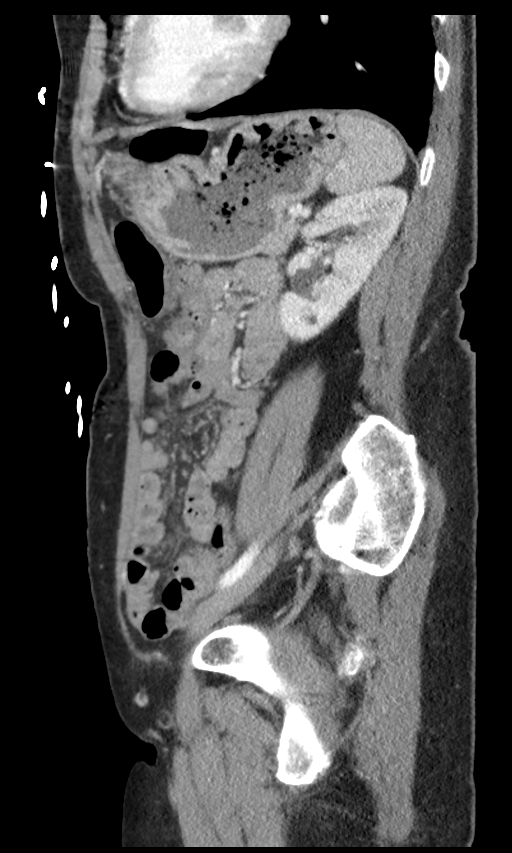

[Series 7: lung bases · axial · 0.70mm/px · z∈[+1153,+1195]mm · 2 of 209 slices shown]
[im 21/209  bone]
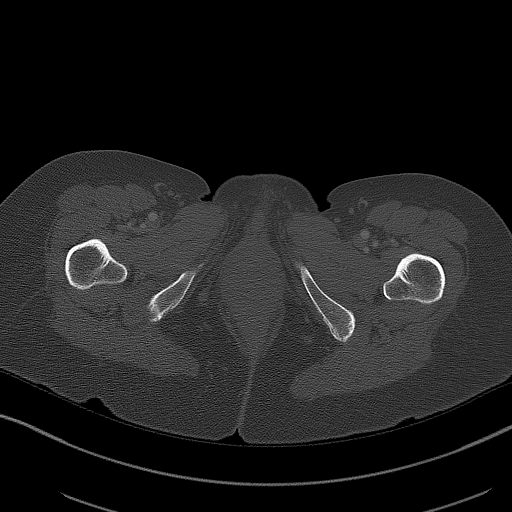
[im 42/209  bone]
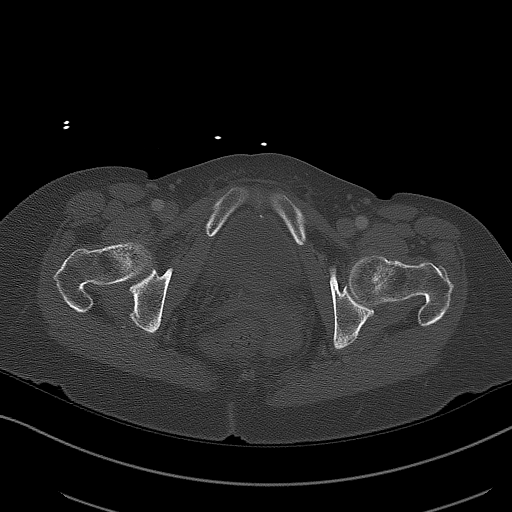

[12 of 46 positions shown; findings below may reference images not displayed]

RADIATION DOSE REDUCTION: This exam was performed according to the
departmental dose-optimization program which includes automated
exposure control, adjustment of the mA and/or kV according to
patient size and/or use of iterative reconstruction technique.

CONTRAST:  100mL OMNIPAQUE IOHEXOL 300 MG/ML  SOLN
FINDINGS: Lower chest: Solid pulmonary nodule in the right lower lobe
measuring 5 mm in mean diameter on series 7, image 10, stable in
size when compared with prior CT dated January 11, 2018 and likely
benign.

Hepatobiliary: Unchanged scattered low-attenuation liver lesions
which are too small to completely characterize but likely simple
cysts. No suspicious liver lesions. Gallbladder is surgically
absent. Prominent common bile duct which is unchanged when compared
with prior exam and not unexpected status post cholecystectomy.

Pancreas: Unchanged prominence of the main pancreatic duct at the
level of the pancreatic head. No surrounding inflammatory change.

Spleen: Normal in size without focal abnormality.

Adrenals/Urinary Tract: Bilateral adrenal glands are unremarkable.
Kidneys and symmetrically with no evidence of hydronephrosis or
nephrolithiasis. Bladder is unremarkable.

Stomach/Bowel: Stomach is within normal limits. Appendix is not
visualized and is likely surgically absent. No evidence of bowel
wall thickening, distention, or inflammatory changes.

Vascular/Lymphatic: Aortic atherosclerosis. Dilated infrarenal
abdominal aorta, measuring up to 2.3 cm. No enlarged abdominal or
pelvic lymph nodes.

Reproductive: Uterus and bilateral adnexa are unremarkable.

Other: No abdominal wall hernia or abnormality. No abdominopelvic
ascites.

Musculoskeletal: No acute or significant osseous findings.
IMPRESSION: 1. No acute findings in the abdomen or pelvis.
2.  Aortic Atherosclerosis ([id number].T).
3. Dilated infrarenal abdominal aorta, measuring up to 2.3 cm.
Recommend follow-up ultrasound every 5 years. This recommendation
follows ACR consensus guidelines: White Paper of the ACR Incidental

## 2023-05-24 ENCOUNTER — Other Ambulatory Visit: Payer: Self-pay | Admitting: Internal Medicine

## 2023-05-24 NOTE — Telephone Encounter (Signed)
Pt called / informed she has fills on singulair and spiriva.

## 2023-05-24 NOTE — Telephone Encounter (Signed)
  montelukast (SINGULAIR) 10 MG tablet cetirizine (ZYRTEC) 10 MG tablet   tiotropium (SPIRIVA) 18 MCG inhalation capsule   [id number] - [place], [place] - [address] AT [facility] OF [facility] & [address]

## 2023-05-26 ENCOUNTER — Encounter: Payer: 59 | Admitting: Internal Medicine

## 2023-05-26 MED ORDER — CETIRIZINE HCL 10 MG PO TABS: 10.0000 mg | ORAL_TABLET | Freq: Every day | ORAL | 3 refills | Status: DC

## 2023-06-02 ENCOUNTER — Ambulatory Visit: Payer: 59 | Admitting: Internal Medicine

## 2023-06-02 ENCOUNTER — Encounter: Payer: Self-pay | Admitting: Internal Medicine

## 2023-06-02 ENCOUNTER — Other Ambulatory Visit: Payer: Self-pay

## 2023-06-02 VITALS — BP 123/90 | HR 67 | Temp 97.9°F | Ht 67.0 in | Wt 143.6 lb

## 2023-06-02 DIAGNOSIS — R141 Gas pain: Secondary | ICD-10-CM | POA: Diagnosis not present

## 2023-06-02 DIAGNOSIS — G8929 Other chronic pain: Secondary | ICD-10-CM

## 2023-06-02 DIAGNOSIS — R1031 Right lower quadrant pain: Secondary | ICD-10-CM | POA: Diagnosis not present

## 2023-06-02 DIAGNOSIS — M810 Age-related osteoporosis without current pathological fracture: Secondary | ICD-10-CM

## 2023-06-02 DIAGNOSIS — I10 Essential (primary) hypertension: Secondary | ICD-10-CM

## 2023-06-02 DIAGNOSIS — Z87891 Personal history of nicotine dependence: Secondary | ICD-10-CM | POA: Diagnosis not present

## 2023-06-02 DIAGNOSIS — E785 Hyperlipidemia, unspecified: Secondary | ICD-10-CM | POA: Diagnosis not present

## 2023-06-02 DIAGNOSIS — Z23 Encounter for immunization: Secondary | ICD-10-CM

## 2023-06-02 DIAGNOSIS — R1032 Left lower quadrant pain: Secondary | ICD-10-CM | POA: Diagnosis not present

## 2023-06-02 DIAGNOSIS — K582 Mixed irritable bowel syndrome: Secondary | ICD-10-CM

## 2023-06-02 DIAGNOSIS — Z1231 Encounter for screening mammogram for malignant neoplasm of breast: Secondary | ICD-10-CM

## 2023-06-02 LAB — BASIC METABOLIC PANEL
Anion gap: 10 (ref 5–15)
BUN: 10 mg/dL (ref 8–23)
CO2: 23 mmol/L (ref 22–32)
Calcium: 10.1 mg/dL (ref 8.9–10.3)
Chloride: 107 mmol/L (ref 98–111)
Creatinine, Ser: 0.87 mg/dL (ref 0.44–1.00)
GFR, Estimated: >60 mL/min (ref >60)
Glucose, Bld: 88 mg/dL (ref 70–99)
Potassium: 4.1 mmol/L (ref 3.5–5.1)
Sodium: 140 mmol/L (ref 135–145)

## 2023-06-02 MED ORDER — DICYCLOMINE HCL 10 MG PO CAPS
10.0000 mg | ORAL_CAPSULE | Freq: Three times a day (TID) | ORAL | 0 refills | Status: DC | PRN
Start: 2023-06-02 — End: 2023-09-16

## 2023-06-02 NOTE — Progress Notes (Signed)
Routine visit for 72 yo [name] living with as yet uncharacterized memory impairment (observed by me), constipation-predominant IBS,  HTN, hyperlipidemia, among other concerns.  Last visit with me was 01/2023 at which time she wasn't sure which medicines she was taking.  Fortunately she brought them all with her today.  Her youngest brother died this week, older brother recently died.  Nerves are acting up.  Stress.  No sleep.  Has a couple days of feeling good,, then bad, cycling.  No good days lately. She requests medicine to help with all of these issues as well as for discomfort in her pelvis when she moves her bowels.  Bowel pattern has been regular with Linzess, which she sometimes will skip if Bms are too loose.    Meds reviewed.  Not taking statin or ezetimibe.  Confused about these and didn't remember what they were for. Taking all others.    BP (!) 123/90 (BP Location: Left Arm, Patient Position: Sitting, Cuff Size: Normal)   Pulse 67   Temp 97.9 F (36.6 C) (Oral)   Ht 5\' 7"  (1.702 m)   Wt 143 lb 9.6 oz (65.1 kg)   SpO2 100% Comment: RA  BMI 22.49 kg/m  Slender habitus though stable weight.  Talkative about her symptoms as is her norm.  Appears tired but comfortable.  Lungs are clear throughout.  Heart RRR.  Abdomen with normal bowel sounds.  Mild poorly localized discomfort with palpation of entire abdomen which is also her norm.  Skin turgor slightly reduced.  No LE edema.    Problems addressed today (not ordered by priority):  Hypertension 123/90! Best it has been in a long time.  She is taking spironolactone 50 mg daily, metoprolol succinate 50 mg daily, losartan 100 mg daily.  She has history of hypokalemia even without a diuretic and occasionally takes a potassium 10 mEQ (prescribed daily).  Recheck bmp today.   Hyperlipidemia Tifany has the atorvastatin bottle but wasn't sure if she was taking it.  Doesn't recall the prescription for ezetimibe.  Will work toward adherence  packaging.    Osteoporosis without current pathological fracture Remains adherent to bisphosphonate. No falls, no fractures.   Chronic recurrent  abdominal pain associated with bowel irregularity and gas; suspected IBS Symptoms better controlled on linzess.  NO longer takes gasex.  Complains vehemently about lower abdominal discomfort with bms (easy to pass) and recalls a medicine for cramping.  I advised her that dicyclomine can be tried but that there is a risk for worsening constipation.  We will trial.   As she was walking out she reported chest congestion and cough.  Lungs were clear.  Guaifenesin suggested.  She requested antibiotic which was declined.

## 2023-06-02 NOTE — Assessment & Plan Note (Addendum)
123/90! Best it has been in a long time.  She is taking spironolactone 50 mg daily, metoprolol succinate 50 mg daily, losartan 100 mg daily.  She has history of hypokalemia even without a diuretic and occasionally takes a potassium 10 mEQ (prescribed daily).  Recheck bmp today.

## 2023-06-04 MED ORDER — ATORVASTATIN CALCIUM 40 MG PO TABS
40.0000 mg | ORAL_TABLET | Freq: Every day | ORAL | 3 refills | Status: DC
Start: 2023-06-04 — End: 2024-07-17

## 2023-06-04 NOTE — Assessment & Plan Note (Signed)
Symptoms better controlled on linzess.  NO longer takes gasex.  Complains vehemently about lower abdominal discomfort with bms (easy to pass) and recalls a medicine for cramping.  I advised her that dicyclomine can be tried but that there is a risk for worsening constipation.  We will trial.

## 2023-06-04 NOTE — Assessment & Plan Note (Signed)
Remains adherent to bisphosphonate. No falls, no fractures.

## 2023-06-04 NOTE — Assessment & Plan Note (Signed)
[name] has the atorvastatin bottle but wasn't sure if she was taking it.  Doesn't recall the prescription for ezetimibe.  Will work toward adherence packaging.

## 2023-07-07 ENCOUNTER — Ambulatory Visit
Admission: RE | Admit: 2023-07-07 | Discharge: 2023-07-07 | Disposition: A | Discharge status: Home / Self Care | Payer: 59 | Source: Ambulatory Visit | Attending: Internal Medicine | Admitting: Internal Medicine

## 2023-07-07 DIAGNOSIS — Z1231 Encounter for screening mammogram for malignant neoplasm of breast: Secondary | ICD-10-CM

## 2023-07-25 ENCOUNTER — Other Ambulatory Visit: Payer: Self-pay

## 2023-07-25 DIAGNOSIS — I10 Essential (primary) hypertension: Secondary | ICD-10-CM

## 2023-07-25 MED ORDER — METOPROLOL SUCCINATE ER 50 MG PO TB24: 50.0000 mg | ORAL_TABLET | Freq: Every day | ORAL | 5 refills | Status: DC

## 2023-08-04 ENCOUNTER — Other Ambulatory Visit: Payer: Self-pay

## 2023-08-04 MED ORDER — POTASSIUM CHLORIDE CRYS ER 10 MEQ PO TBCR: 10.0000 meq | EXTENDED_RELEASE_TABLET | Freq: Every day | ORAL | 0 refills | Status: DC

## 2023-08-11 ENCOUNTER — Other Ambulatory Visit: Payer: Self-pay | Admitting: Internal Medicine

## 2023-08-11 ENCOUNTER — Telehealth: Payer: Self-pay | Admitting: Internal Medicine

## 2023-08-11 DIAGNOSIS — Z8639 Personal history of other endocrine, nutritional and metabolic disease: Secondary | ICD-10-CM

## 2023-08-11 MED ORDER — POTASSIUM CHLORIDE CRYS ER 10 MEQ PO TBCR
10.0000 meq | EXTENDED_RELEASE_TABLET | Freq: Every day | ORAL | 0 refills | Status: DC
Start: 2023-08-11 — End: 2024-01-03

## 2023-08-11 NOTE — Telephone Encounter (Signed)
Pt states she accidentally threw the medication away and does not have any of the following medication:   potassium chloride (KLOR-CON M) 10 MEQ tablet   [id number] - [place], [place] - [address] AT [facility] OF [facility] & [address]

## 2023-08-30 ENCOUNTER — Emergency Department (HOSPITAL_COMMUNITY)
Admission: EM | Admit: 2023-08-30 | Discharge: 2023-08-30 | Disposition: A | Discharge status: Home / Self Care | Payer: 59 | Attending: Student | Admitting: Student

## 2023-08-30 ENCOUNTER — Other Ambulatory Visit: Payer: Self-pay

## 2023-08-30 DIAGNOSIS — J3489 Other specified disorders of nose and nasal sinuses: Secondary | ICD-10-CM | POA: Diagnosis not present

## 2023-08-30 DIAGNOSIS — Z20822 Contact with and (suspected) exposure to covid-19: Secondary | ICD-10-CM | POA: Insufficient documentation

## 2023-08-30 DIAGNOSIS — J45909 Unspecified asthma, uncomplicated: Secondary | ICD-10-CM | POA: Insufficient documentation

## 2023-08-30 DIAGNOSIS — I10 Essential (primary) hypertension: Secondary | ICD-10-CM | POA: Diagnosis not present

## 2023-08-30 DIAGNOSIS — F1210 Cannabis abuse, uncomplicated: Secondary | ICD-10-CM | POA: Insufficient documentation

## 2023-08-30 DIAGNOSIS — R059 Cough, unspecified: Secondary | ICD-10-CM | POA: Diagnosis present

## 2023-08-30 DIAGNOSIS — Z79899 Other long term (current) drug therapy: Secondary | ICD-10-CM | POA: Diagnosis not present

## 2023-08-30 DIAGNOSIS — J019 Acute sinusitis, unspecified: Secondary | ICD-10-CM

## 2023-08-30 LAB — SARS CORONAVIRUS 2 BY RT PCR: SARS Coronavirus 2 by RT PCR: NEGATIVE

## 2023-08-30 MED ORDER — AMOXICILLIN-POT CLAVULANATE 875-125 MG PO TABS: 1.0000 | ORAL_TABLET | Freq: Two times a day (BID) | ORAL | 0 refills | Status: DC

## 2023-08-30 MED ORDER — AMOXICILLIN-POT CLAVULANATE 875-125 MG PO TABS
1.0000 | ORAL_TABLET | Freq: Once | ORAL | Status: AC
  Administered 2023-08-30: 1 via ORAL
  Filled 2023-08-30: qty 1

## 2023-08-30 MED ORDER — PSEUDOEPHEDRINE HCL ER 120 MG PO TB12
120.0000 mg | ORAL_TABLET | Freq: Once | ORAL | Status: AC
  Administered 2023-08-30: 120 mg via ORAL
  Filled 2023-08-30: qty 1

## 2023-08-30 MED ORDER — PSEUDOEPHEDRINE HCL ER 120 MG PO TB12: 120.0000 mg | ORAL_TABLET | Freq: Every day | ORAL | 0 refills | Status: AC

## 2023-08-30 NOTE — ED Triage Notes (Signed)
 Pt stated, Ive got this runny nose with some mucous. It feels like its all up in my head.. Started 3 days ago.

## 2023-08-30 NOTE — ED Provider Notes (Signed)
 [facility] AT [facility] Provider Note  CSN: [id number] Arrival date & time: 08/30/23 9340  Chief Complaint(s) Nasal Congestion  HPI [name] is a 73 y.o. female with PMH asthma, H. pylori, A-fib status post ablation who presents emergency department for evaluation of sinus pain and pressure and cough.  States symptoms have been worsening over the last few days and has had some mild blood-tinged mucus with blowing her nose very hard.  Endorses chest pain with coughing only but no chest pain at rest or with exertion.  No associated shortness of breath, abdominal pain, nausea, vomiting or other systemic symptoms.   Past Medical History Past Medical History:  Diagnosis Date   Asthma in adult, mild intermittent, uncomplicated    Chronic anticoagulation    Chronic constipation    COVID-19 virus infection 10/19/2021   Eczema    GERD (gastroesophageal reflux disease)    H. pylori infection 09/24/2021   Identified on EGD biopsy.  Treatment will be discussed with her GI doctor.  SHe will need a very simple regimen which has no contraindications in combination with her existing medication regimen.     Hemoperitoneum 09/27/2021   Due to splenic lac occurring after colonoscopy on 09/24/21.  Xarelto  resumed.    History of kidney stones    Hx of vaginal bleeding, resolved, ultrasound completed 10/11/2018   Hypertension    Laceration of spleen    Presumed complication of colonoscopy 09/24/21.  Pain and hemoperitoneum.  Xarelto  resumed.  Short term opioid pain management.     Personal history of atrial fibrillation, s/p ablation, now in NSR but remains on anticoaulation 11/26/2015   Followed in cardiology clinic    Patient Active Problem List   Diagnosis Date Noted   Vitamin D  deficiency 12/03/2022   History of impacted cerumen 10/05/2022   Anxiety about health 09/30/2022   History of colonic polyps    H/O hypokalemia 10/29/2020   Abdominal aortic atherosclerosis  (HCC) 10/22/2020   Seasonal and perennial allergic (presumed) rhinitis 07/25/2020   Asthma in adult, mild intermittent, uncomplicated    Chronic recurrent  abdominal pain associated with bowel irregularity and gas; suspected IBS 07/24/2020   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 Authorizing Provider  alendronate  (FOSAMAX ) 70 MG tablet Take 1 tablet (70 mg total) by mouth every 7 (seven) days. Take with a full glass of water  on an empty stomach. 01/06/23 01/06/24  Nguyen, Quan, DO  atorvastatin  (LIPITOR) 40 MG tablet Take 1 tablet (40 mg total) by mouth daily. 06/04/23 06/03/24  Trudy Mliss Dragon, MD  cetirizine  (ZYRTEC ) 10 MG tablet Take 1 tablet (10 mg total) by mouth daily. Take 10 mg by mouth daily. 05/26/23   Trudy Mliss Dragon, MD  diclofenac  Sodium (VOLTAREN ) 1 % GEL APPLY 2 GRAMS TO AFFECTED AREA FOUR TIMES DAILY 11/05/22   Trudy Mliss Dragon, MD  dicyclomine  (BENTYL ) 10 MG capsule Take 1 capsule (10 mg total)  by mouth 3 (three) times daily as needed for spasms. 06/02/23   Trudy Mliss Dragon, MD  fluticasone  (FLONASE ) 50 MCG/ACT nasal spray Place 1 spray into both nostrils daily. 10/04/22 10/04/23  Nooruddin, Saad, MD  fluticasone  (FLOVENT  HFA) 110 MCG/ACT inhaler Inhale 1 puff into the lungs daily. Inhale 1 puff into the lungs daily. 09/16/22   Trudy Mliss Dragon, MD  levalbuterol   (XOPENEX  HFA) 45 MCG/ACT inhaler Inhale 1 puff into the lungs every 6 (six) hours as needed for shortness of breath. 03/04/22   Trudy Mliss Dragon, MD  linaclotide  (LINZESS ) 145 MCG CAPS capsule Take 1 capsule (145 mcg total) by mouth daily before breakfast. 01/31/23 01/26/24  Trudy Mliss Dragon, MD  losartan  (COZAAR ) 100 MG tablet Take 1 tablet (100 mg total) by mouth daily. 01/19/23 01/19/24  Trudy Mliss Dragon, MD  metoprolol  succinate (TOPROL -XL) 50 MG 24 hr tablet Take 1 tablet (50 mg total) by mouth daily. Take with or immediately following a meal. 07/25/23   Trudy Mliss Dragon, MD  montelukast  (SINGULAIR ) 10 MG tablet Take 1 tablet (10 mg total) by mouth at bedtime. 01/20/23   Trudy Mliss Dragon, MD  omeprazole  (PRILOSEC) 40 MG capsule TAKE 1 CAPSULE(40 MG) BY MOUTH IN THE MORNING AND AT BEDTIME 02/22/23   Trudy Mliss Dragon, MD  potassium chloride  (KLOR-CON  M) 10 MEQ tablet Take 1 tablet (10 mEq total) by mouth daily. 08/11/23   Trudy Mliss Dragon, MD  rivaroxaban  (XARELTO ) 20 MG TABS tablet Take 1 tablet (20 mg total) by mouth daily with supper. 01/18/23   Terra Fairy PARAS, PA-C  simethicone  (MYLICON) 80 MG chewable tablet Chew 1 tablet (80 mg total) by mouth every 6 (six) hours as needed for flatulence. 04/11/23   Francesca Elsie CROME, MD  spironolactone  (ALDACTONE ) 25 MG tablet Take 2 tablets (50 mg total) by mouth daily. For blood pressure. 12/02/22 12/02/23  Trudy Mliss Dragon, MD  tiotropium (SPIRIVA ) 18 MCG inhalation capsule Place 1 capsule (18 mcg total) into inhaler and inhale daily. 01/19/23   Trudy Mliss Dragon, MD                                                                                                                                    Past Surgical History Past Surgical History:  Procedure Laterality Date   ATRIAL FIBRILLATION ABLATION N/A 09/30/2017   Procedure: ATRIAL FIBRILLATION ABLATION;  Surgeon: Kelsie Agent, MD;  Location: [facility] INVASIVE CV LAB;  Service:  Cardiovascular;  Laterality: N/A;   BIOPSY  09/24/2021   Procedure: BIOPSY;  Surgeon: Federico Rosario BROCKS, MD;  Location: [facility] ENDOSCOPY;  Service: Gastroenterology;;   CHOLECYSTECTOMY N/A 12/08/2018   Procedure: LAPAROSCOPIC CHOLECYSTECTOMY WITH INTRAOPERATIVE CHOLANGIOGRAM;  Surgeon: Curvin Deward MOULD, MD;  Location: [facility] OR;  Service: General;  Laterality: N/A;   COLONOSCOPY WITH PROPOFOL  N/A 09/24/2021   Procedure: COLONOSCOPY WITH PROPOFOL ;  Surgeon: Federico Rosario BROCKS, MD;  Location: [facility] ENDOSCOPY;  Service:  Gastroenterology;  Laterality: N/A;   ERCP N/A 12/09/2018   Procedure: ENDOSCOPIC RETROGRADE CHOLANGIOPANCREATOGRAPHY (ERCP);  Surgeon: Rollin Dover, MD;  Location: [facility] ENDOSCOPY;  Service: Endoscopy;  Laterality: N/A;   ESOPHAGOGASTRODUODENOSCOPY (EGD) WITH PROPOFOL  N/A 09/24/2021   Procedure: ESOPHAGOGASTRODUODENOSCOPY (EGD) WITH PROPOFOL ;  Surgeon: Federico Rosario BROCKS, MD;  Location: [facility] ENDOSCOPY;  Service: Gastroenterology;  Laterality: N/A;   POLYPECTOMY  09/24/2021   Procedure: POLYPECTOMY;  Surgeon: Federico Rosario BROCKS, MD;  Location: [facility] ENDOSCOPY;  Service: Gastroenterology;;   ANNETT  12/09/2018   Procedure: ANNETT;  Surgeon: Rollin Dover, MD;  Location: [facility] ENDOSCOPY;  Service: Endoscopy;;   TONSILLECTOMY     TUBAL LIGATION     Family History Family History  Problem Relation Age of Onset   Heart attack Mother        Young age   Breast cancer Sister        Late 10s; now s/p mastectomy    Lung cancer Brother        x 2   Cancer Sister    Cancer Sister    Cancer Sister    Colon cancer Neg Hx    Colon polyps Neg Hx    Esophageal cancer Neg Hx    Rectal cancer Neg Hx    Stomach cancer Neg Hx     Social History Social History   Tobacco Use   Smoking status: Former    Current packs/Shelby: 0.00    Types: Cigarettes    Quit date: 10/24/2015    Years since quitting: 7.8   Smokeless tobacco: Never  Vaping Use   Vaping status: Never Used  Substance Use Topics   Alcohol use: Not  Currently   Drug use: Not Currently    Types: Marijuana    Comment: Smoked marijuana in the past.   Allergies Albuterol , Percocet [oxycodone -acetaminophen ], Celecoxib, and Tramadol   Review of Systems Review of Systems  HENT:  Positive for sinus pressure and sinus pain.   Respiratory:  Positive for cough.     Physical Exam Vital Signs  I have reviewed the triage vital signs BP (!) 150/84 (BP Location: Right Arm)   Pulse 73   Temp 97.7 F (36.5 C)   Resp 16   Ht 5' 7 (1.702 m)   Wt 63.5 kg   SpO2 100%   BMI 21.93 kg/m   Physical Exam Vitals and nursing note reviewed.  Constitutional:      General: She is not in acute distress.    Appearance: She is well-developed.  HENT:     Head: Normocephalic and atraumatic.     Comments: Sinus tenderness Eyes:     Conjunctiva/sclera: Conjunctivae normal.  Cardiovascular:     Rate and Rhythm: Normal rate and regular rhythm.     Heart sounds: No murmur heard. Pulmonary:     Effort: Pulmonary effort is normal. No respiratory distress.     Breath sounds: Normal breath sounds.  Abdominal:     Palpations: Abdomen is soft.     Tenderness: There is no abdominal tenderness.  Musculoskeletal:        General: No swelling.     Cervical back: Neck supple.  Skin:    General: Skin is warm and dry.     Capillary Refill: Capillary refill takes less than 2 seconds.  Neurological:     Mental Status: She is alert.  Psychiatric:        Mood and Affect: Mood normal.     ED Results and Treatments Labs (all  labs ordered are listed, but only abnormal results are displayed) Labs Reviewed  SARS CORONAVIRUS 2 BY RT PCR                                                                                                                          Radiology No results found.  Pertinent labs & imaging results that were available during my care of the patient were reviewed by me and considered in my medical decision making (see MDM for  details).  Medications Ordered in ED Medications  amoxicillin -clavulanate (AUGMENTIN ) 875-125 MG per tablet 1 tablet (has no administration in time range)  pseudoephedrine  (SUDAFED) 12 hr tablet 120 mg (has no administration in time range)                                                                                                                                     Procedures Procedures  (including critical care time)  Medical Decision Making / ED Course   This patient presents to the ED for concern of sinus pain and pressure, cough, this involves an extensive number of treatment options, and is a complaint that carries with it a high risk of complications and morbidity.  The differential diagnosis includes unspecified viral URI, COVID-19, sinusitis, bronchitis  MDM: Patient seen emergency room for evaluation of sinus pain and pressure, cough.  Physical exam with tenderness over the frontal sinuses bilaterally but is otherwise unremarkable.  Pulmonary exam unremarkable.  COVID-negative.  Patient not hypoxic and is in no respiratory distress and thus advanced imaging deferred.  Patient presentation consistent with acute sinusitis and we will cover with Sudafed and Augmentin .  Given return precautions of which she voiced understanding she was discharged.   Additional history obtained:  -External records from outside source obtained and reviewed including: Chart review including previous notes, labs, imaging, consultation notes   Lab Tests: -I ordered, reviewed, and interpreted labs.   The pertinent results include:   Labs Reviewed  SARS CORONAVIRUS 2 BY RT PCR      Medicines ordered and prescription drug management: Meds ordered this encounter  Medications   amoxicillin -clavulanate (AUGMENTIN ) 875-125 MG per tablet 1 tablet   pseudoephedrine  (SUDAFED) 12 hr tablet 120 mg    -I have reviewed the patients home medicines and have made adjustments as needed  Critical  interventions none   Social Determinants of Health:  Factors impacting patients  care include: none   Reevaluation: After the interventions noted above, I reevaluated the patient and found that they have :improved  Co morbidities that complicate the patient evaluation  Past Medical History:  Diagnosis Date   Asthma in adult, mild intermittent, uncomplicated    Chronic anticoagulation    Chronic constipation    COVID-19 virus infection 10/19/2021   Eczema    GERD (gastroesophageal reflux disease)    H. pylori infection 09/24/2021   Identified on EGD biopsy.  Treatment will be discussed with her GI doctor.  SHe will need a very simple regimen which has no contraindications in combination with her existing medication regimen.     Hemoperitoneum 09/27/2021   Due to splenic lac occurring after colonoscopy on 09/24/21.  Xarelto  resumed.    History of kidney stones    Hx of vaginal bleeding, resolved, ultrasound completed 10/11/2018   Hypertension    Laceration of spleen    Presumed complication of colonoscopy 09/24/21.  Pain and hemoperitoneum.  Xarelto  resumed.  Short term opioid pain management.     Personal history of atrial fibrillation, s/p ablation, now in NSR but remains on anticoaulation 11/26/2015   Followed in cardiology clinic       Dispostion: I considered admission for this patient, but at this time she does not meet inpatient criteria for admission and will be discharged with outpatient follow-up     Final Clinical Impression(s) / ED Diagnoses Final diagnoses:  None     @PCDICTATION @    Albertina Dixon, MD 08/30/23 1007

## 2023-08-31 ENCOUNTER — Telehealth: Payer: Self-pay | Admitting: *Deleted

## 2023-08-31 ENCOUNTER — Ambulatory Visit: Payer: 59 | Admitting: Internal Medicine

## 2023-08-31 DIAGNOSIS — J3489 Other specified disorders of nose and nasal sinuses: Secondary | ICD-10-CM

## 2023-08-31 MED ORDER — DOXYCYCLINE HYCLATE 100 MG PO TABS
100.0000 mg | ORAL_TABLET | Freq: Two times a day (BID) | ORAL | 0 refills | Status: AC
Start: 2023-08-31 — End: 2023-09-03

## 2023-08-31 NOTE — Progress Notes (Signed)
   CC: sinus pain  This is a telephone encounter between [name] and [name] on 08/31/2023 for sinus pain. The visit was conducted with the patient located at home and [name] at [facility]. The patient's identity was confirmed using their DOB and current address. The patient has consented to being evaluated through a telephone encounter and understands the associated risks (an examination cannot be done and the patient may need to come in for an appointment) / benefits (allows the patient to remain at home, decreasing exposure to coronavirus). I personally spent 20 minutes on medical discussion.   HPI:  [name] is a 73 y.o. with PMH as below.   Please see A&P for assessment of the patient's acute and chronic medical conditions.   Past Medical History:  Diagnosis Date   Asthma in adult, mild intermittent, uncomplicated    Chronic anticoagulation    Chronic constipation    COVID-19 virus infection 10/19/2021   Eczema    GERD (gastroesophageal reflux disease)    H. pylori infection 09/24/2021   Identified on EGD biopsy.  Treatment will be discussed with her GI doctor.  SHe will need a very simple regimen which has no contraindications in combination with her existing medication regimen.     Hemoperitoneum 09/27/2021   Due to splenic lac occurring after colonoscopy on 09/24/21.  Xarelto  resumed.    History of kidney stones    Hx of vaginal bleeding, resolved, ultrasound completed 10/11/2018   Hypertension    Laceration of spleen    Presumed complication of colonoscopy 09/24/21.  Pain and hemoperitoneum.  Xarelto  resumed.  Short term opioid pain management.     Personal history of atrial fibrillation, s/p ablation, now in NSR but remains on anticoaulation 11/26/2015   Followed in cardiology clinic    Review of Systems:  as below  Assessment & Plan:   Patient reports sinus pain, runny nose and cough for the past few days. She has had some blood-tinged mucus when she blows her  nose. Denies fever. She was seen in the ED yesterday and diagnosed with acute sinusitis and prescribed Augmentin . She reports the Augmentin  made her nauseous and throw up and is requesting a new antibiotic.   Unsure whether patient's symptoms are truly due to a true bacterial infection. However, patient is extremely concerned about her symptoms and as antibiotics have already been started I will prescribe a 3 day course of doxycycline  100mg  BID. Return precautions given.    Patient discussed with Dr. Trudy Shelby Burnet, MD Internal Medicine Resident

## 2023-08-31 NOTE — Telephone Encounter (Signed)
 Pt stated she was seen in the ED yesterday and dx with sinus infection. She was prescribed an abx; stated it made her sick , she threw up for about 1 1/2 hour and it affected her heart. Pt is requesting a different abx. Scheduled pt a telehealth appt with Dr Francella this am @ [phone] - pt instructed to have her phone near by.

## 2023-09-01 NOTE — Progress Notes (Signed)
 Internal Medicine Clinic Attending  Case discussed with the resident at the time of the visit.  We reviewed the resident's history and exam and pertinent patient test results.  I agree with the assessment, diagnosis, and plan of care documented in the resident's note.

## 2023-09-13 ENCOUNTER — Telehealth: Payer: Self-pay

## 2023-09-13 NOTE — Telephone Encounter (Signed)
RTC to patient requesting more Antibiotic.  Had gotten Doxycycline on 08/26/2023.  Took for 5 days.  Completed course.. States would lie to get a prescription for a few more as she felt better when taking.  Coughing up white Phlegm and has some chest tightness.  No fever.  Thinks that a few more pills will help her get rid of the infection.  Would also like a call from her doctor if possible to discuss.

## 2023-09-13 NOTE — Telephone Encounter (Signed)
Requesting to speak with a nurse about getting more antibiotic. Please call pt back.

## 2023-09-14 ENCOUNTER — Other Ambulatory Visit: Payer: Self-pay | Admitting: Internal Medicine

## 2023-09-14 DIAGNOSIS — J4520 Mild intermittent asthma, uncomplicated: Secondary | ICD-10-CM

## 2023-09-14 MED ORDER — QVAR REDIHALER 40 MCG/ACT IN AERB: 1.0000 | INHALATION_SPRAY | Freq: Two times a day (BID) | RESPIRATORY_TRACT | 6 refills | Status: DC

## 2023-09-14 NOTE — Telephone Encounter (Signed)
Rx request from the pharmacy for fluticasone Flovent hfa, per pharmacy this medication is not covered by the patients insurance, insurance prefers [employer]

## 2023-09-14 NOTE — Telephone Encounter (Signed)
Pt states no one has called her back about her Antibiotics yesterday.  Pt wants to know what to do.  Pt states she is also needing the following medication filled but her ins will not pay for it.    fluticasone (FLOVENT HFA) 110 MCG/ACT inhaler   [id number] - [place], [place] - [address] AT [facility] OF [facility] & [address]

## 2023-09-14 NOTE — Telephone Encounter (Signed)
Pt was called and informed of Dr [name]' response. Pt informed rx for Qvar was sent to the pharmacy. Stated she will wait and  see how she's feeling before scheduling an appt ; stated it might be just the weather.

## 2023-09-15 DIAGNOSIS — J019 Acute sinusitis, unspecified: Secondary | ICD-10-CM | POA: Diagnosis not present

## 2023-09-15 DIAGNOSIS — H6122 Impacted cerumen, left ear: Secondary | ICD-10-CM | POA: Diagnosis not present

## 2023-09-16 ENCOUNTER — Emergency Department (HOSPITAL_COMMUNITY): Payer: 59

## 2023-09-16 ENCOUNTER — Other Ambulatory Visit: Payer: Self-pay

## 2023-09-16 ENCOUNTER — Emergency Department (HOSPITAL_COMMUNITY)
Admission: EM | Admit: 2023-09-16 | Discharge: 2023-09-16 | Disposition: A | Discharge status: Home / Self Care | Payer: 59 | Attending: Emergency Medicine | Admitting: Emergency Medicine

## 2023-09-16 ENCOUNTER — Encounter (HOSPITAL_COMMUNITY): Payer: Self-pay | Admitting: Emergency Medicine

## 2023-09-16 DIAGNOSIS — J0101 Acute recurrent maxillary sinusitis: Secondary | ICD-10-CM | POA: Diagnosis not present

## 2023-09-16 DIAGNOSIS — R079 Chest pain, unspecified: Secondary | ICD-10-CM | POA: Diagnosis not present

## 2023-09-16 DIAGNOSIS — J329 Chronic sinusitis, unspecified: Secondary | ICD-10-CM | POA: Diagnosis not present

## 2023-09-16 DIAGNOSIS — Z743 Need for continuous supervision: Secondary | ICD-10-CM | POA: Diagnosis not present

## 2023-09-16 DIAGNOSIS — R9431 Abnormal electrocardiogram [ECG] [EKG]: Secondary | ICD-10-CM | POA: Diagnosis not present

## 2023-09-16 DIAGNOSIS — Z7901 Long term (current) use of anticoagulants: Secondary | ICD-10-CM | POA: Insufficient documentation

## 2023-09-16 DIAGNOSIS — R1111 Vomiting without nausea: Secondary | ICD-10-CM | POA: Diagnosis not present

## 2023-09-16 DIAGNOSIS — R519 Headache, unspecified: Secondary | ICD-10-CM | POA: Diagnosis present

## 2023-09-16 DIAGNOSIS — R110 Nausea: Secondary | ICD-10-CM | POA: Diagnosis not present

## 2023-09-16 DIAGNOSIS — R6889 Other general symptoms and signs: Secondary | ICD-10-CM | POA: Diagnosis not present

## 2023-09-16 LAB — COMPREHENSIVE METABOLIC PANEL
ALT: 22 U/L (ref 0–44)
AST: 21 U/L (ref 15–41)
Albumin: 3.8 g/dL (ref 3.5–5.0)
Alkaline Phosphatase: 56 U/L (ref 38–126)
Anion gap: 11 (ref 5–15)
BUN: 11 mg/dL (ref 8–23)
CO2: 22 mmol/L (ref 22–32)
Calcium: 9.9 mg/dL (ref 8.9–10.3)
Chloride: 107 mmol/L (ref 98–111)
Creatinine, Ser: 0.76 mg/dL (ref 0.44–1.00)
GFR, Estimated: >60 mL/min (ref >60)
Glucose, Bld: 97 mg/dL (ref 70–99)
Potassium: 3.4 mmol/L — ABNORMAL LOW (ref 3.5–5.1)
Sodium: 140 mmol/L (ref 135–145)
Total Bilirubin: 0.5 mg/dL (ref 0.0–1.2)
Total Protein: 7.2 g/dL (ref 6.5–8.1)

## 2023-09-16 LAB — CBC WITH DIFFERENTIAL/PLATELET
Abs Immature Granulocytes: 0.02 10*3/uL (ref 0.00–0.07)
Basophils Absolute: 0 10*3/uL (ref 0.0–0.1)
Basophils Relative: 0 %
Eosinophils Absolute: 0 10*3/uL (ref 0.0–0.5)
Eosinophils Relative: 1 %
HCT: 36.5 % (ref 36.0–46.0)
Hemoglobin: 11.9 g/dL — ABNORMAL LOW (ref 12.0–15.0)
Immature Granulocytes: 0 %
Lymphocytes Relative: 16 %
Lymphs Abs: 0.9 10*3/uL (ref 0.7–4.0)
MCH: 27.4 pg (ref 26.0–34.0)
MCHC: 32.6 g/dL (ref 30.0–36.0)
MCV: 84.1 fL (ref 80.0–100.0)
Monocytes Absolute: 0.3 10*3/uL (ref 0.1–1.0)
Monocytes Relative: 6 %
Neutro Abs: 4.2 10*3/uL (ref 1.7–7.7)
Neutrophils Relative %: 77 %
Platelets: 233 10*3/uL (ref 150–400)
RBC: 4.34 MIL/uL (ref 3.87–5.11)
RDW: 15.5 % (ref 11.5–15.5)
WBC: 5.5 10*3/uL (ref 4.0–10.5)
nRBC: 0 % (ref 0.0–0.2)

## 2023-09-16 MED ORDER — LACTATED RINGERS IV BOLUS
500.0000 mL | Freq: Once | INTRAVENOUS | Status: AC
  Administered 2023-09-16: 500 mL via INTRAVENOUS

## 2023-09-16 MED ORDER — DICYCLOMINE HCL 20 MG PO TABS: 20.0000 mg | ORAL_TABLET | Freq: Two times a day (BID) | ORAL | 0 refills | Status: DC | PRN

## 2023-09-16 MED ORDER — DOXYCYCLINE HYCLATE 100 MG PO CAPS: 100.0000 mg | ORAL_CAPSULE | Freq: Two times a day (BID) | ORAL | 0 refills | Status: DC

## 2023-09-16 MED ORDER — DICYCLOMINE HCL 10 MG PO CAPS
10.0000 mg | ORAL_CAPSULE | Freq: Once | ORAL | Status: AC
  Administered 2023-09-16: 10 mg via ORAL
  Filled 2023-09-16: qty 1

## 2023-09-16 NOTE — Discharge Instructions (Signed)
Continue the Medrol dosepack and Flonase as prescribed yesterday when seen by your Ear, Nose and Throat doctor. Do not take any more of the Augmentin 875 mg (the antibiotic prescribed yesterday). Take Doxycycline for an additional 5 days as prescribed.   You need to list Augmentin as a medication on your allergy list so that it is not prescribed to you in the future.   Follow up with your primary care doctor or your Ear, Nose and Throat doctor for recheck in one week.

## 2023-09-16 NOTE — ED Provider Notes (Signed)
[place] EMERGENCY DEPARTMENT AT [facility] Provider Note   CSN: [id number] Arrival date & time: 09/16/23  3086     History  Chief Complaint  Patient presents with   Emesis    [name] is a 73 y.o. female.  Patient to ED for treatment of persistent symptoms of sinus pressure, congestion, headache. History of recurrent sinusitis. Seen in ED 1/7 and given Augmentin which made her dizzy, nauseous with vomiting. These symptoms stopped with cessation of the medication. She was seen by her primary care provider 1/8 by telemedicine and given 3 days of Doxycycline which she tolerated well but does not feel it cleared her symptoms. She went to ENT yesterday and was again prescribed Augmentin and had recurrent similar symptoms of intolerance vs allergic reaction. She presents in the ED today for change of antibiotic treatment.   The history is provided by the patient. No language interpreter was used.  Emesis      Home Medications Prior to Admission medications   Medication Sig Start Date End Date Taking? Authorizing Provider  doxycycline (VIBRAMYCIN) 100 MG capsule Take 1 capsule (100 mg total) by mouth 2 (two) times daily. 09/16/23  Yes Dilraj Killgore, Melvenia Beam, PA-C  alendronate (FOSAMAX) 70 MG tablet Take 1 tablet (70 mg total) by mouth every 7 (seven) days. Take with a full glass of water on an empty stomach. 01/06/23 01/06/24  Doran Stabler, DO  atorvastatin (LIPITOR) 40 MG tablet Take 1 tablet (40 mg total) by mouth daily. 06/04/23 06/03/24  Miguel Aschoff, MD  beclomethasone (QVAR REDIHALER) 40 MCG/ACT inhaler Inhale 1 puff into the lungs 2 (two) times daily. 09/14/23   Miguel Aschoff, MD  cetirizine (ZYRTEC) 10 MG tablet Take 1 tablet (10 mg total) by mouth daily. Take 10 mg by mouth daily. 05/26/23   Miguel Aschoff, MD  diclofenac Sodium (VOLTAREN) 1 % GEL APPLY 2 GRAMS TO AFFECTED AREA FOUR TIMES DAILY 11/05/22   Miguel Aschoff, MD  dicyclomine (BENTYL) 10  MG capsule Take 1 capsule (10 mg total) by mouth 3 (three) times daily as needed for spasms. 06/02/23   Miguel Aschoff, MD  fluticasone (FLONASE) 50 MCG/ACT nasal spray Place 1 spray into both nostrils daily. 10/04/22 10/04/23  Nooruddin, Jason Fila, MD  levalbuterol [facility] HFA) 45 MCG/ACT inhaler Inhale 1 puff into the lungs every 6 (six) hours as needed for shortness of breath. 03/04/22   Miguel Aschoff, MD  linaclotide [facility]) 145 MCG CAPS capsule Take 1 capsule (145 mcg total) by mouth daily before breakfast. 01/31/23 01/26/24  Miguel Aschoff, MD  losartan (COZAAR) 100 MG tablet Take 1 tablet (100 mg total) by mouth daily. 01/19/23 01/19/24  Miguel Aschoff, MD  metoprolol succinate (TOPROL-XL) 50 MG 24 hr tablet Take 1 tablet (50 mg total) by mouth daily. Take with or immediately following a meal. 07/25/23   Miguel Aschoff, MD  montelukast (SINGULAIR) 10 MG tablet Take 1 tablet (10 mg total) by mouth at bedtime. 01/20/23   Miguel Aschoff, MD  omeprazole (PRILOSEC) 40 MG capsule TAKE 1 CAPSULE(40 MG) BY MOUTH IN THE MORNING AND AT BEDTIME 02/22/23   Miguel Aschoff, MD  potassium chloride (KLOR-CON M) 10 MEQ tablet Take 1 tablet (10 mEq total) by mouth daily. 08/11/23   Miguel Aschoff, MD  rivaroxaban (XARELTO) 20 MG TABS tablet Take 1 tablet (20 mg total) by mouth daily with supper. 01/18/23   Eustace Pen, PA-C  simethicone [facility])  80 MG chewable tablet Chew 1 tablet (80 mg total) by mouth every 6 (six) hours as needed for flatulence. 04/11/23   Lonell Grandchild, MD  spironolactone (ALDACTONE) 25 MG tablet Take 2 tablets (50 mg total) by mouth daily. For blood pressure. 12/02/22 12/02/23  Miguel Aschoff, MD  tiotropium (SPIRIVA) 18 MCG inhalation capsule Place 1 capsule (18 mcg total) into inhaler and inhale daily. 01/19/23   Miguel Aschoff, MD      Allergies    Albuterol, Percocet [oxycodone-acetaminophen], Celecoxib, and Tramadol    Review of  Systems   Review of Systems  Gastrointestinal:  Positive for vomiting.    Physical Exam Updated Vital Signs BP (!) 145/90   Pulse 76   Temp 98.3 F (36.8 C)   Resp 16   SpO2 97%  Physical Exam Constitutional:      Appearance: She is well-developed.  HENT:     Head: Normocephalic.     Right Ear: Tympanic membrane normal.     Left Ear: Tympanic membrane normal.     Nose: Mucosal edema present.     Right Sinus: Maxillary sinus tenderness present.     Left Sinus: Maxillary sinus tenderness present.  Cardiovascular:     Rate and Rhythm: Normal rate and regular rhythm.     Heart sounds: No murmur heard. Pulmonary:     Effort: Pulmonary effort is normal.     Breath sounds: Normal breath sounds. No wheezing, rhonchi or rales.  Abdominal:     General: Bowel sounds are normal.     Palpations: Abdomen is soft.     Tenderness: There is no abdominal tenderness. There is no guarding or rebound.  Musculoskeletal:        General: Normal range of motion.     Cervical back: Normal range of motion and neck supple.  Skin:    General: Skin is warm and dry.  Neurological:     General: No focal deficit present.     Mental Status: She is alert and oriented to person, place, and time.     ED Results / Procedures / Treatments   Labs (all labs ordered are listed, but only abnormal results are displayed) Labs Reviewed  CBC WITH DIFFERENTIAL/PLATELET - Abnormal; Notable for the following components:      Result Value   Hemoglobin 11.9 (*)    All other components within normal limits  COMPREHENSIVE METABOLIC PANEL - Abnormal; Notable for the following components:   Potassium 3.4 (*)    All other components within normal limits    EKG None  Radiology DG Chest 2 View Result Date: 09/16/2023 CLINICAL DATA:  73 year old female with chest pain after starting antibiotics for sinus infection. EXAM: CHEST - 2 VIEW COMPARISON:  Chest radiographs 01/12/2023 and earlier. FINDINGS: Seated AP and  lateral views of the chest. Chronically large lung volumes are stable. Mediastinal contours are stable and within normal limits. Visualized tracheal air column is within normal limits. No pneumothorax, pulmonary edema, pleural effusion or acute lung opacity. Mild linear right lung base scarring is chronic. No acute osseous abnormality identified. IMPRESSION: Chronically pulmonary hyperinflation. No acute cardiopulmonary abnormality. Electronically Signed   By: Odessa Fleming M.D.   On: 09/16/2023 10:51    Procedures Procedures    Medications Ordered in ED Medications  lactated ringers bolus 500 mL (500 mLs Intravenous New Bag/Given 09/16/23 1104)    ED Course/ Medical Decision Making/ A&P Clinical Course as of 09/16/23 1131  Fri Sep 16, 2023  1123 Patient seen for the 4th time since 1/7 for symptoms of recurrent sinusitis. No fever. On review of the chart, she was prescribed Augmentin yesterday, but also given a steroid dose pain and Flonase. Feel she should continue these medications. She tolerated Doxycycline well but will likely need a longer course. Recommend she follow back up with her ENT physician in one week. She is also advised to list Augmentin as a medication she cannot take due to intolerance. [SU]  1125 Labs reviewed: no leukocytosis, CXR clear. VSS, afebrile. Stable for discharge.  [SU]    Clinical Course User Index [SU] Elpidio Anis, PA-C                                 Medical Decision Making          Final Clinical Impression(s) / ED Diagnoses Final diagnoses:  Acute recurrent maxillary sinusitis    Rx / DC Orders ED Discharge Orders          Ordered    doxycycline (VIBRAMYCIN) 100 MG capsule  2 times daily        09/16/23 1128              Elpidio Anis, PA-C 09/16/23 1131    [place], [place], DO 09/24/23 [phone]

## 2023-09-16 NOTE — ED Triage Notes (Addendum)
Pt reports she was dx with sinus infection 2 weeks ago. Pt reports no improvement. Was prescribed amoxicillin and took first dose this morning. After taking dose this morning she vomited. Pt was given 4mg  zofran en route.

## 2023-09-16 NOTE — ED Provider Triage Note (Signed)
Emergency Medicine Provider Triage Evaluation Note  [name] , a 73 y.o. female  was evaluated in triage.  Pt complains of nausea vomiting, weakness and upper abdominal pain after starting an antibiotic this morning for an ongoing sinus infection.  For several weeks now she has had facial pain, congestion, mucus.  She was given an antibiotic last week but reports it was too strong and she did not take it and then saw ENT yesterday they gave her Augmentin and an hour after taking it the symptoms developed..  Review of Systems  Positive: Facial pain, persistent congestion, nausea vomiting, upper abdominal pain Negative: Fever, productive cough, shortness of breath  Physical Exam  BP (!) 145/90   Pulse 76   Temp 98.3 F (36.8 C)   Resp 16   SpO2 97%  Gen:   Awake, no distress   Resp:  Normal effort  MSK:   Moves extremities without difficulty  Other:  Minimal upper abdominal pain, bilateral swollen nasal turbinates and effusion in the right ear  Medical Decision Making  Medically screening exam initiated at 9:56 AM.  Appropriate orders placed.  Baker Janus was informed that the remainder of the evaluation will be completed by another provider, this initial triage assessment does not replace that evaluation, and the importance of remaining in the ED until their evaluation is complete.     Gwyneth Sprout, MD 09/16/23 [phone]

## 2023-09-17 ENCOUNTER — Encounter (HOSPITAL_COMMUNITY): Payer: Self-pay

## 2023-09-17 ENCOUNTER — Other Ambulatory Visit: Payer: Self-pay

## 2023-09-17 ENCOUNTER — Emergency Department (HOSPITAL_COMMUNITY)
Admission: EM | Admit: 2023-09-17 | Discharge: 2023-09-17 | Disposition: A | Discharge status: Home / Self Care | Payer: 59 | Attending: Emergency Medicine | Admitting: Emergency Medicine

## 2023-09-17 DIAGNOSIS — T380X1A Poisoning by glucocorticoids and synthetic analogues, accidental (unintentional), initial encounter: Secondary | ICD-10-CM | POA: Insufficient documentation

## 2023-09-17 DIAGNOSIS — T50901A Poisoning by unspecified drugs, medicaments and biological substances, accidental (unintentional), initial encounter: Secondary | ICD-10-CM

## 2023-09-17 NOTE — ED Triage Notes (Signed)
Pt states she has had some brain fog recently while she has been ill with a sinus infection x 2 weeks. Today she started her Medrol dose pack (24mg ) as she was supposed to, but then forgot and took her second days dose (20mg ). Pt started having some ShOB with exertion after taking the second dose.

## 2023-09-17 NOTE — ED Provider Triage Note (Signed)
Emergency Medicine Provider Triage Evaluation Note  [name] , a 73 y.o. female  was evaluated in triage.  Pt complains of accidental over-ingestion of prednisone.Seen yesterday. Followed by ENT and has f/u scheduled already.  Was prescribed 24mg  for first day and took 44mg . No new worsening Sx. Pt states Sx have improved since yesterday. No worsening dyspnea, chest pain. Currently taking doxycycline.  Denies smoking.   Review of Systems  Positive: See above Negative: See above  Physical Exam  BP 135/79 (BP Location: Right Arm)   Pulse 71   Temp 98.2 F (36.8 C) (Oral)   Resp 18   Ht 5\' 7"  (1.702 m)   Wt 63.5 kg   SpO2 99%   BMI 21.93 kg/m  Gen:   Awake, no distress   Resp:  Normal effort  MSK:   Moves extremities without difficulty  Other:    Medical Decision Making  Medically screening exam initiated at 12:41 PM.  Appropriate orders placed.  [name] was informed that the remainder of the evaluation will be completed by another provider, this initial triage assessment does not replace that evaluation, and the importance of remaining in the ED until their evaluation is complete.     Lunette Stands, [place] 09/17/23 1249

## 2023-09-17 NOTE — ED Provider Notes (Signed)
[name] EMERGENCY DEPARTMENT AT [facility] Provider Note   CSN: [id number] Arrival date & time: 09/17/23  1214     History  Chief Complaint  Patient presents with   Drug Overdose    Unintentional    [name] is a 73 y.o. female.   Drug Overdose  Patient is a 73 year old female presents the ED complaining of accidental over ingestion of prednisone.  Seen seen by ENT and also seen yesterday and prescribed doxycycline for chronic sinusitis and has a follow-up with ENT already scheduled. States that she was prescribed to take 24 mg for the first day of being on prednisone and accidentally took the next day's dose as well totaling 44 mg.  Denies worsening symptoms and denies any chest pain, shortness of breath, abdominal pain, fever.       Home Medications Prior to Admission medications   Medication Sig Start Date End Date Taking? Authorizing Provider  alendronate (FOSAMAX) 70 MG tablet Take 1 tablet (70 mg total) by mouth every 7 (seven) days. Take with a full glass of water on an empty stomach. 01/06/23 01/06/24  Doran Stabler, DO  atorvastatin (LIPITOR) 40 MG tablet Take 1 tablet (40 mg total) by mouth daily. 06/04/23 06/03/24  Miguel Aschoff, MD  beclomethasone (QVAR REDIHALER) 40 MCG/ACT inhaler Inhale 1 puff into the lungs 2 (two) times daily. 09/14/23   Miguel Aschoff, MD  cetirizine (ZYRTEC) 10 MG tablet Take 1 tablet (10 mg total) by mouth daily. Take 10 mg by mouth daily. 05/26/23   Miguel Aschoff, MD  diclofenac Sodium (VOLTAREN) 1 % GEL APPLY 2 GRAMS TO AFFECTED AREA FOUR TIMES DAILY 11/05/22   Miguel Aschoff, MD  dicyclomine (BENTYL) 20 MG tablet Take 1 tablet (20 mg total) by mouth 2 (two) times daily as needed for spasms. 09/16/23   Elpidio Anis, PA-C  doxycycline (VIBRAMYCIN) 100 MG capsule Take 1 capsule (100 mg total) by mouth 2 (two) times daily. 09/16/23   Elpidio Anis, PA-C  fluticasone (FLONASE) 50 MCG/ACT nasal spray Place  1 spray into both nostrils daily. 10/04/22 10/04/23  Nooruddin, Jason Fila, MD  levalbuterol [facility] HFA) 45 MCG/ACT inhaler Inhale 1 puff into the lungs every 6 (six) hours as needed for shortness of breath. 03/04/22   Miguel Aschoff, MD  linaclotide [facility]) 145 MCG CAPS capsule Take 1 capsule (145 mcg total) by mouth daily before breakfast. 01/31/23 01/26/24  Miguel Aschoff, MD  losartan (COZAAR) 100 MG tablet Take 1 tablet (100 mg total) by mouth daily. 01/19/23 01/19/24  Miguel Aschoff, MD  metoprolol succinate (TOPROL-XL) 50 MG 24 hr tablet Take 1 tablet (50 mg total) by mouth daily. Take with or immediately following a meal. 07/25/23   Miguel Aschoff, MD  montelukast (SINGULAIR) 10 MG tablet Take 1 tablet (10 mg total) by mouth at bedtime. 01/20/23   Miguel Aschoff, MD  omeprazole (PRILOSEC) 40 MG capsule TAKE 1 CAPSULE(40 MG) BY MOUTH IN THE MORNING AND AT BEDTIME 02/22/23   Miguel Aschoff, MD  potassium chloride (KLOR-CON M) 10 MEQ tablet Take 1 tablet (10 mEq total) by mouth daily. 08/11/23   Miguel Aschoff, MD  rivaroxaban (XARELTO) 20 MG TABS tablet Take 1 tablet (20 mg total) by mouth daily with supper. 01/18/23   Eustace Pen, PA-C  simethicone (MYLICON) 80 MG chewable tablet Chew 1 tablet (80 mg total) by mouth every 6 (six) hours as needed for flatulence. 04/11/23   Scheving,  Jerilee Field, MD  spironolactone (ALDACTONE) 25 MG tablet Take 2 tablets (50 mg total) by mouth daily. For blood pressure. 12/02/22 12/02/23  Miguel Aschoff, MD  tiotropium (SPIRIVA) 18 MCG inhalation capsule Place 1 capsule (18 mcg total) into inhaler and inhale daily. 01/19/23   Miguel Aschoff, MD      Allergies    Albuterol, Percocet [oxycodone-acetaminophen], Celecoxib, and Tramadol    Review of Systems   Review of Systems  HENT:  Positive for congestion.   Respiratory:  Positive for cough.   All other systems reviewed and are negative.   Physical Exam Updated Vital  Signs BP 135/79 (BP Location: Right Arm)   Pulse 71   Temp 98.2 F (36.8 C) (Oral)   Resp 18   Ht 5\' 7"  (1.702 m)   Wt 63.5 kg   SpO2 99%   BMI 21.93 kg/m  Physical Exam Vitals and nursing note reviewed.  Constitutional:      General: She is not in acute distress.    Appearance: Normal appearance. She is not ill-appearing.  HENT:     Head: Normocephalic and atraumatic.     Nose: Congestion present. No rhinorrhea.  Eyes:     Extraocular Movements: Extraocular movements intact.     Conjunctiva/sclera: Conjunctivae normal.  Cardiovascular:     Rate and Rhythm: Normal rate and regular rhythm.     Pulses: Normal pulses.     Heart sounds: Normal heart sounds. No murmur heard.    No friction rub. No gallop.  Pulmonary:     Effort: Pulmonary effort is normal. No respiratory distress.     Breath sounds: Normal breath sounds. No stridor. No wheezing, rhonchi or rales.  Abdominal:     General: Abdomen is flat.     Palpations: Abdomen is soft.     Tenderness: There is no abdominal tenderness.  Musculoskeletal:     Cervical back: No rigidity or tenderness.     Right lower leg: No edema.     Left lower leg: No edema.  Lymphadenopathy:     Cervical: No cervical adenopathy.  Skin:    General: Skin is warm and dry.  Neurological:     General: No focal deficit present.     Mental Status: She is alert. Mental status is at baseline.  Psychiatric:        Mood and Affect: Mood normal.     ED Results / Procedures / Treatments   Labs (all labs ordered are listed, but only abnormal results are displayed) Labs Reviewed - No data to display  EKG None  Radiology DG Chest 2 View Result Date: 09/16/2023 CLINICAL DATA:  73 year old female with chest pain after starting antibiotics for sinus infection. EXAM: CHEST - 2 VIEW COMPARISON:  Chest radiographs 01/12/2023 and earlier. FINDINGS: Seated AP and lateral views of the chest. Chronically large lung volumes are stable. Mediastinal  contours are stable and within normal limits. Visualized tracheal air column is within normal limits. No pneumothorax, pulmonary edema, pleural effusion or acute lung opacity. Mild linear right lung base scarring is chronic. No acute osseous abnormality identified. IMPRESSION: Chronically pulmonary hyperinflation. No acute cardiopulmonary abnormality. Electronically Signed   By: Odessa Fleming M.D.   On: 09/16/2023 10:51    Procedures Procedures    Medications Ordered in ED Medications - No data to display  ED Course/ Medical Decision Making/ A&P  Medical Decision Making  This patient is a 73 year old female who presents to the ED for concern of accidental over ingestion of prednisone.   Differential diagnoses prior to evaluation: The emergent differential diagnosis includes, but is not limited to, allergic reaction, overdose, encephalopathy. This is not an exhaustive differential.   Past Medical History / Co-morbidities / Social History: Atrial fibrillation  Additional history: Chart reviewed. Pertinent results include: Seen by ENT and prescribed Augmentin has had poor reaction to Augmentin and is currently taking doxycycline.  Has a follow-up with ENT already scheduled.  Seen yesterday in the ED.  Lab Tests/Imaging studies: No labs or imaging were necessary for this visit.    Medications: No medications are needed for this visit.  I have reviewed the patients home medicines and have made adjustments as needed.  ED Course:   Patient is a 73 year old female presents the ED today complaining of accidental over ingestion of prednisone.  Had taken a total of 44 mg but she was only supposed take 24 mg.  Denies any new or worsening symptoms.  States that her symptoms actually improved since being seen yesterday.  Has a follow-up with ENT already scheduled.  Denies any chest pain, shortness of breath, allergic reaction.   Physical exam was unremarkable only  noted for mild congestion. Patient says that she has been feeling better since her visit yesterday.  Bartlett to over ingestion of being still within means of normal treatment, will not provide any more labs or workup as patient is also currently asymptomatic since over ingestion 4 hours ago.  Patient vitals have been stable throughout the course of her time here.  I believe this patient is stable to be discharged at this time.  Provided strict return to ER precautions.  Patient expressed agreement understanding of plan.   Disposition: After consideration of the diagnostic results and the patients response to treatment, I feel that patient benefit from discharge and treatment as above.   emergency department workup does not suggest an emergent condition requiring admission or immediate intervention beyond what has been performed at this time. The plan is: Continue to take medications as prescribed, return for any new or worsening symptoms, follow-up with ENT,. The patient is safe for discharge and has been instructed to return immediately for worsening symptoms, change in symptoms or any other concerns.   Final Clinical Impression(s) / ED Diagnoses Final diagnoses:  Accidental drug overdose, initial encounter    Rx / DC Orders ED Discharge Orders     None         Lunette Stands, PA-C 09/17/23 1301    Derwood Kaplan, MD 09/17/23 [phone]

## 2023-09-17 NOTE — ED Notes (Signed)
Pt provided discharge instructions and prescription information. Pt was given the opportunity to ask questions and questions were answered.

## 2023-09-17 NOTE — Discharge Instructions (Addendum)
You were seen today for prednisone over ingestion.  This is still well within the means of normal treatment.  I do not believe that any emergent processes present at this time.  Due to you having normal vital signs and symptoms have improved since yesterday, I believe you are safe to discharge at this time.  Please return for any new or worsening symptoms.

## 2023-09-19 ENCOUNTER — Telehealth: Payer: Self-pay

## 2023-09-19 NOTE — Transitions of Care (Post Inpatient/ED Visit) (Signed)
09/19/2023  Name: [name] MRN: [id number] DOB: November 04, 1950  Today's TOC FU Call Status: Today's TOC FU Call Status:: Successful TOC FU Call Completed TOC FU Call Complete Date: 09/19/23 Patient's Name and Date of Birth confirmed.  Transition Care Management Follow-up Telephone Call Date of Discharge: 09/17/23 Discharge Facility: [facility]) Type of Discharge: Emergency Department Reason for ED Visit: Other: (poisoning) How have you been since you were released from the hospital?: Better Any questions or concerns?: No  Items Reviewed: Did you receive and understand the discharge instructions provided?: Yes Medications obtained,verified, and reconciled?: Yes (Medications Reviewed) Any new allergies since your discharge?: No Dietary orders reviewed?: Yes Do you have support at home?: Yes People in Home: child(ren), adult  Medications Reviewed Today: Medications Reviewed Today     Reviewed by Karena Addison, LPN (Licensed Practical Nurse) on 09/19/23 at 1450  Med List Status: <None>   Medication Order Taking? Sig Documenting Provider Last Dose Status Informant  alendronate (FOSAMAX) 70 MG tablet [id number]  Take 1 tablet (70 mg total) by mouth every 7 (seven) days. Take with a full glass of water on an empty stomach. Doran Stabler, DO  Active   atorvastatin (LIPITOR) 40 MG tablet [id number]  Take 1 tablet (40 mg total) by mouth daily. Miguel Aschoff, MD  Active   beclomethasone (QVAR REDIHALER) 40 MCG/ACT inhaler [id number]  Inhale 1 puff into the lungs 2 (two) times daily. Miguel Aschoff, MD  Active   cetirizine (ZYRTEC) 10 MG tablet [id number]  Take 1 tablet (10 mg total) by mouth daily. Take 10 mg by mouth daily. Miguel Aschoff, MD  Active   diclofenac Sodium (VOLTAREN) 1 % GEL [id number]  APPLY 2 GRAMS TO AFFECTED AREA FOUR TIMES DAILY Miguel Aschoff, MD  Active   dicyclomine (BENTYL) 20 MG tablet [id number]  Take 1 tablet (20 mg total) by mouth 2  (two) times daily as needed for spasms. Elpidio Anis, PA-C  Active   doxycycline (VIBRAMYCIN) 100 MG capsule [id number]  Take 1 capsule (100 mg total) by mouth 2 (two) times daily. Elpidio Anis, PA-C  Active   fluticasone (FLONASE) 50 MCG/ACT nasal spray [id number]  Place 1 spray into both nostrils daily. NooruddinJason Fila, MD  Active   levalbuterol [facility] HFA) 45 MCG/ACT inhaler [id number] No Inhale 1 puff into the lungs every 6 (six) hours as needed for shortness of breath. Miguel Aschoff, MD Taking Active Self, Pharmacy Records  linaclotide [facility]) 145 MCG CAPS capsule [id number]  Take 1 capsule (145 mcg total) by mouth daily before breakfast. Miguel Aschoff, MD  Active   losartan (COZAAR) 100 MG tablet [id number]  Take 1 tablet (100 mg total) by mouth daily. Miguel Aschoff, MD  Active   metoprolol succinate (TOPROL-XL) 50 MG 24 hr tablet [id number]  Take 1 tablet (50 mg total) by mouth daily. Take with or immediately following a meal. Miguel Aschoff, MD  Active   montelukast (SINGULAIR) 10 MG tablet [id number]  Take 1 tablet (10 mg total) by mouth at bedtime. Miguel Aschoff, MD  Active   omeprazole (PRILOSEC) 40 MG capsule [id number]  TAKE 1 CAPSULE(40 MG) BY MOUTH IN THE MORNING AND AT BEDTIME Miguel Aschoff, MD  Active   potassium chloride (KLOR-CON M) 10 MEQ tablet [id number]  Take 1 tablet (10 mEq total) by mouth daily. Miguel Aschoff, MD  Active   rivaroxaban (XARELTO) 20 MG TABS tablet [id number]  Take 1 tablet (20 mg total)  by mouth daily with supper. Eustace Pen, PA-C  Active   simethicone (MYLICON) 80 MG chewable tablet [id number]  Chew 1 tablet (80 mg total) by mouth every 6 (six) hours as needed for flatulence. Lonell Grandchild, MD  Active   spironolactone (ALDACTONE) 25 MG tablet [id number]  Take 2 tablets (50 mg total) by mouth daily. For blood pressure. Miguel Aschoff, MD  Active   tiotropium [facility]) 18 MCG inhalation capsule  [id number]  Place 1 capsule (18 mcg total) into inhaler and inhale daily. Miguel Aschoff, MD  Active             Home Care and Equipment/Supplies: Were Home Health Services Ordered?: NA Any new equipment or medical supplies ordered?: NA  Functional Questionnaire: Do you need assistance with bathing/showering or dressing?: No Do you need assistance with meal preparation?: No Do you need assistance with eating?: No Do you have difficulty maintaining continence: No Do you need assistance with getting out of bed/getting out of a chair/moving?: No Do you have difficulty managing or taking your medications?: No  Follow up appointments reviewed: PCP Follow-up appointment confirmed?: No (no avail appt. sent message to staff to schedule) MD Provider Line Number:[phone] Given: No Specialist Hospital Follow-up appointment confirmed?: NA Do you need transportation to your follow-up appointment?: No Do you understand care options if your condition(s) worsen?: Yes-patient verbalized understanding    SIGNATURE Karena Addison, LPN [facility] Nurse Health Advisor Direct Dial [phone]

## 2023-09-21 ENCOUNTER — Telehealth: Payer: Self-pay

## 2023-09-21 NOTE — Telephone Encounter (Signed)
Patient called she stated she has been taking beclomethasone (QVAR REDIHALER) 40 MCG/ACT inhaler and it is not agreeing with her stomach patient is requesting to switch back to flovent.

## 2023-09-23 ENCOUNTER — Ambulatory Visit (INDEPENDENT_AMBULATORY_CARE_PROVIDER_SITE_OTHER): Payer: 59 | Admitting: Student

## 2023-09-23 VITALS — BP 132/93 | HR 74 | Temp 98.0°F | Ht 67.0 in | Wt 137.5 lb

## 2023-09-23 DIAGNOSIS — J0100 Acute maxillary sinusitis, unspecified: Secondary | ICD-10-CM

## 2023-09-23 MED ORDER — FEXOFENADINE HCL 180 MG PO TABS: 180.0000 mg | ORAL_TABLET | Freq: Every day | ORAL | 3 refills | Status: DC

## 2023-09-23 MED ORDER — FLUTICASONE PROPIONATE HFA 44 MCG/ACT IN AERO: 1.0000 | INHALATION_SPRAY | Freq: Two times a day (BID) | RESPIRATORY_TRACT | 2 refills | Status: DC

## 2023-09-23 NOTE — Patient Instructions (Signed)
Thank you, [name] for allowing [name] to provide your care today. Today we discussed your head cold. We will have you try to take a new allergy medication that is not zyrtec, called allegra. Please stop taking the zyrtec.   Start to use the flonase daily, this will help your head congestion drain. Please follow up with the ENT (ear and throat) doctor.    Please tilt the flonase to the outside of the nose when using.   Call the ENT doctor at : [phone]     I have ordered the following medication/changed the following medications:   Stop the following medications: Medications Discontinued During This Encounter  Medication Reason   cetirizine (ZYRTEC) 10 MG tablet Change in therapy     Start the following medications: Meds ordered this encounter  Medications   fexofenadine (ALLEGRA ALLERGY) 180 MG tablet    Sig: Take 1 tablet (180 mg total) by mouth daily.    Dispense:  90 tablet    Refill:  3     Follow up: 1 month with your PCP Dr. [name]    Remember: To call the doctor for your chronic sinus inflammation   Should you have any questions or concerns please call the internal medicine clinic at [phone].     Please note that our late policy has changed.  If you are more than 15 minutes late to your appointment, you may be asked to reschedule your appointment.  Dr. Hessie Diener, D.O. [facility]

## 2023-09-23 NOTE — Progress Notes (Unsigned)
Established Patient Office Visit  Subjective   Patient ID: [name], female    DOB: 05/26/1951  Age: 73 y.o. MRN: [id number]  Chief Complaint  Patient presents with   ED FU VISIT     Want to discuss about medication.    cold     Not feeling well.     [name] is a 73 y.o. who presents to the clinic for emergency department follow-up and for her "head cold". Please see problem based assessment and plan for additional details.  Patient was evaluated in emergency department 1/24 for symptoms of recurrent sinusitis.  At the emergency department patient was provided with 5-day course of doxycycline which she finished.  She was seen by ENT on 1/23 and they prescribed her Augmentin and Medrol pack.  Patient felt nauseated and was vomiting from the Augmentin.  While the patient was on doxycycline, her symptoms did not resolve or improve.  She states that she feels the same as she did when she was first evaluated on 1/8.  Better? N Asthma--> inhaler  F/c? N Cbc in ED fine:  ABX didn't help Declines OTC meds  White mucous cough, chest hurts from coughing Head feels big  Afebrile  Using flonase wrong    Patient Active Problem List   Diagnosis Date Noted   Vitamin D deficiency 12/03/2022   History of impacted cerumen 10/05/2022   Anxiety about health 09/30/2022   History of colonic polyps    H/O hypokalemia 10/29/2020   Abdominal aortic atherosclerosis (HCC) 10/22/2020   Seasonal and perennial allergic (presumed) rhinitis 07/25/2020   Asthma in adult, mild intermittent, uncomplicated    Chronic recurrent  abdominal pain associated with bowel irregularity and gas; suspected IBS 07/24/2020   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 (HCC) 07/23/2020   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 02/14/2020   Non-cardiac chest pain 07/23/2019   Primary osteoarthritis involving multiple joints, primarily L knee 06/14/2018   Osteoporosis without current pathological  fracture 04/15/2017   Chronic constipation 10/19/2016   Anemia 10/19/2016   Hyperlipidemia 03/18/2016   GERD (gastroesophageal reflux disease)    Personal history of atrial fibrillation, now s/p ablation in [facility] 11/26/2015   Marijuana abuse 06/11/2015   Hypertension       Objective:     BP (!) 132/93 (BP Location: Left Arm, Patient Position: Sitting, Cuff Size: Normal)   Pulse 74   Temp 98 F (36.7 C) (Oral)   Ht 5\' 7"  (1.702 m)   Wt 137 lb 8 oz (62.4 kg)   SpO2 100%   BMI 21.54 kg/m  BP Readings from Last 3 Encounters:  09/23/23 (!) 132/93  09/17/23 135/79  09/16/23 (!) 145/90   Wt Readings from Last 3 Encounters:  09/23/23 137 lb 8 oz (62.4 kg)  09/17/23 140 lb (63.5 kg)  08/30/23 140 lb (63.5 kg)      Physical Exam   No results found for any visits on 09/23/23.  Last CBC Lab Results  Component Value Date   WBC 5.5 09/16/2023   HGB 11.9 (L) 09/16/2023   HCT 36.5 09/16/2023   MCV 84.1 09/16/2023   MCH 27.4 09/16/2023   RDW 15.5 09/16/2023   PLT 233 09/16/2023   Last metabolic panel Lab Results  Component Value Date   GLUCOSE 97 09/16/2023   NA 140 09/16/2023   K 3.4 (L) 09/16/2023   CL 107 09/16/2023   CO2 22 09/16/2023   BUN 11 09/16/2023   CREATININE  0.76 09/16/2023   GFRNONAA >60 09/16/2023   CALCIUM 9.9 09/16/2023   PHOS 3.0 04/15/2017   PROT 7.2 09/16/2023   ALBUMIN 3.8 09/16/2023   BILITOT 0.5 09/16/2023   ALKPHOS 56 09/16/2023   AST 21 09/16/2023   ALT 22 09/16/2023   ANIONGAP 11 09/16/2023      The 10-year ASCVD risk score (Arnett DK, et al., 2019) is: 21.6%    Assessment & Plan:   Problem List Items Addressed This Visit   None   No follow-ups on file.    Shelby Rogue, DO

## 2023-09-24 NOTE — Assessment & Plan Note (Signed)
Patient was evaluated in emergency department 1/24 for symptoms of recurrent sinusitis.  At the emergency department patient was provided with 5-day course of doxycycline which she finished.  She was seen by ENT on 1/23 and they prescribed her Augmentin and Medrol pack.  Patient felt nauseated and was vomiting from the Augmentin.  While the patient was on doxycycline, her symptoms did not resolve or improve.  She states that she feels the same as she did when she was first evaluated on 1/8. She currently denies fevers or chills. She did not try OTC medications. Patient does report productive cough with white mucus and feeling like her head is congested.  Physical exam did reveal edematous right nasal turbinates.  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  Plan: -Discontinued Zyrtec, will begin Allegra -Patient was incorrectly using Flonase, patient was able to demonstrate to me how to correctly use Flonase during appointment after education -Patient was asked to contact the ENT physician for a follow-up appointment

## 2023-09-26 ENCOUNTER — Other Ambulatory Visit: Payer: Self-pay | Admitting: *Deleted

## 2023-09-26 MED ORDER — FLUTICASONE PROPIONATE HFA 44 MCG/ACT IN AERO: 1.0000 | INHALATION_SPRAY | Freq: Two times a day (BID) | RESPIRATORY_TRACT | 2 refills | Status: DC

## 2023-09-26 NOTE — Progress Notes (Signed)
Wants Flovent sent to the [employer].

## 2023-09-27 ENCOUNTER — Telehealth: Payer: Self-pay

## 2023-09-27 ENCOUNTER — Other Ambulatory Visit: Payer: Self-pay | Admitting: Internal Medicine

## 2023-09-27 DIAGNOSIS — J4520 Mild intermittent asthma, uncomplicated: Secondary | ICD-10-CM

## 2023-09-27 MED ORDER — ARNUITY ELLIPTA 50 MCG/ACT IN AEPB: 1.0000 | INHALATION_SPRAY | Freq: Every day | RESPIRATORY_TRACT | 11 refills | Status: DC

## 2023-09-27 NOTE — Telephone Encounter (Signed)
 Decision:Denied  Patient Name: [name] Patient DOB: 1950-09-11 Patient ID: [id number] Status of Request: Deny Medication Name: Fluticas Hfa Aer GPI/NDC: [id number] Decision Notes: FLUTICAS HFA AER is denied because it is not on your plan's Drug List (formulary). Medication authorization requires the following: (1) You need to try this covered drug: Arnuity Ellipta ; OR (2) your doctor needs to give us  specific medical reasons why the covered drug is not appropriate for you.

## 2023-09-27 NOTE — Telephone Encounter (Signed)
Prior Authorization for patient (Fluticasone Propionate HFA 44MCG/ACT aerosol) came through on cover my meds was submitted with last office notes awaiting approval or denial.  ONG:[id number]

## 2023-10-04 ENCOUNTER — Ambulatory Visit (HOSPITAL_COMMUNITY): Payer: 59 | Admitting: Internal Medicine

## 2023-10-13 ENCOUNTER — Encounter: Payer: 59 | Admitting: Internal Medicine

## 2023-10-13 ENCOUNTER — Ambulatory Visit: Payer: 59 | Admitting: Internal Medicine

## 2023-10-13 DIAGNOSIS — I10 Essential (primary) hypertension: Secondary | ICD-10-CM

## 2023-10-13 NOTE — Progress Notes (Deleted)
 Visit did not occur - no answer on telehealth visit.   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 Take with a full glass of water on an empty stomach., Disp: 12 tablet, Rfl: 3   atorvastatin (LIPITOR) 40 MG tablet, Take 1 tablet (40 mg total) by mouth daily., Disp: 90 tablet, Rfl: 3   diclofenac Sodium (VOLTAREN) 1 % GEL, APPLY 2 GRAMS TO AFFECTED AREA FOUR TIMES DAILY, Disp: 100 g, Rfl: 3   dicyclomine (BENTYL) 20 MG tablet, Take 1 tablet (20 mg total) by mouth 2 (two) times daily as needed for spasms., Disp: 20 tablet, Rfl: 0   doxycycline (VIBRAMYCIN) 100 MG capsule, Take 1 capsule (100 mg total) by mouth 2 (two) times daily., Disp: 10 capsule, Rfl: 0   fexofenadine (ALLEGRA ALLERGY) 180 MG tablet, Take 1 tablet (180 mg total) by mouth  daily., Disp: 90 tablet, Rfl: 3   fluticasone (FLONASE) 50 MCG/ACT nasal spray, Place 1 spray into both nostrils daily., Disp: 15.8 mL, Rfl: 2   Fluticasone Furoate (ARNUITY ELLIPTA) 50 MCG/ACT AEPB, Inhale 1 puff into the lungs daily. To keep asthma under control., Disp: 30 each, Rfl: 11   levalbuterol (XOPENEX HFA) 45 MCG/ACT inhaler, Inhale 1 puff into the lungs every 6 (six) hours as needed for shortness of breath., Disp: 1 each, Rfl: 5   linaclotide (LINZESS) 145 MCG CAPS capsule, Take 1 capsule (145 mcg total) by mouth daily before breakfast., Disp: 30 capsule, Rfl: 11   losartan (COZAAR) 100 MG tablet, Take 1 tablet (100 mg total) by mouth daily., Disp: 90 tablet, Rfl: 3   metoprolol succinate (TOPROL-XL) 50 MG 24 hr tablet, Take 1 tablet (50 mg total) by mouth daily. Take with or immediately following a meal., Disp: 30 tablet, Rfl: 5   montelukast (SINGULAIR) 10 MG tablet, Take 1 tablet (10 mg total) by mouth at bedtime., Disp: 90 tablet, Rfl: 3   omeprazole (PRILOSEC) 40 MG capsule, TAKE 1 CAPSULE(40 MG) BY MOUTH IN THE MORNING AND AT BEDTIME, Disp: 180 capsule, Rfl: 3   potassium chloride (KLOR-CON M) 10 MEQ tablet, Take 1 tablet (10 mEq total) by mouth daily., Disp: 60 tablet, Rfl: 0   rivaroxaban (XARELTO) 20 MG TABS tablet, Take 1 tablet (20 mg total) by mouth daily with supper., Disp: 30 tablet, Rfl: 11   simethicone (MYLICON) 80 MG chewable tablet, Chew 1 tablet (  80 mg total) by mouth every 6 (six) hours as needed for flatulence., Disp: 30 tablet, Rfl: 0   spironolactone (ALDACTONE) 25 MG tablet, Take 2 tablets (50 mg total) by mouth daily. For blood pressure., Disp: 60 tablet, Rfl: 11   tiotropium (SPIRIVA) 18 MCG inhalation capsule, Place 1 capsule (18 mcg total) into inhaler and inhale daily., Disp: 90 capsule, Rfl: 3  Functional Status: ***  Objective There were no vitals taken for this visit.  Exam: ***  Assessment and Plan: There are no diagnoses linked to this encounter.    No follow-ups on file.

## 2023-11-08 ENCOUNTER — Telehealth: Payer: Self-pay | Admitting: *Deleted

## 2023-11-08 NOTE — Telephone Encounter (Signed)
 Return pt's call -stated she does not remember what medications the doctor took her off. Pt wants a list of meds mailed to her. I will print a list of her meds and put it in the mail.

## 2023-11-08 NOTE — Telephone Encounter (Signed)
 Copied from CRM [id number]. Topic: Clinical - Medication Question >> Nov 08, 2023 11:16 AM Hector Shade B wrote: Reason for CRM: patient is needing assistance with going over medications she is currently taking. Please call patient @ [phone]

## 2023-11-11 ENCOUNTER — Other Ambulatory Visit: Payer: Self-pay

## 2023-11-11 MED ORDER — LEVALBUTEROL TARTRATE 45 MCG/ACT IN AERO: 1.0000 | INHALATION_SPRAY | Freq: Four times a day (QID) | RESPIRATORY_TRACT | 5 refills | Status: DC | PRN

## 2023-11-14 NOTE — Progress Notes (Deleted)
error 

## 2023-11-23 ENCOUNTER — Other Ambulatory Visit: Payer: Self-pay

## 2023-11-23 DIAGNOSIS — I10 Essential (primary) hypertension: Secondary | ICD-10-CM

## 2023-11-23 MED ORDER — SPIRONOLACTONE 25 MG PO TABS: 50.0000 mg | ORAL_TABLET | Freq: Every day | ORAL | 11 refills | Status: AC

## 2023-11-23 NOTE — Telephone Encounter (Signed)
 Medication sent to pharmacy

## 2023-12-03 NOTE — Progress Notes (Deleted)
 Error visit

## 2023-12-23 ENCOUNTER — Telehealth: Payer: Self-pay | Admitting: Internal Medicine

## 2023-12-23 NOTE — Telephone Encounter (Unsigned)
 Copied from CRM [id number]. Topic: Appointments - Appointment Cancel/Reschedule >> Dec 22, 2023  8:28 AM [name] wrote: Patient missed (no show) their annual appointment and would like to make another one, patients callback number is [phone].

## 2023-12-26 ENCOUNTER — Other Ambulatory Visit: Payer: Self-pay

## 2023-12-26 DIAGNOSIS — M810 Age-related osteoporosis without current pathological fracture: Secondary | ICD-10-CM

## 2023-12-27 MED ORDER — ALENDRONATE SODIUM 70 MG PO TABS: 70.0000 mg | ORAL_TABLET | ORAL | 3 refills | Status: AC

## 2023-12-29 NOTE — Telephone Encounter (Signed)
 Spoke with the patient and she is sch for 01/03/2024 to f/u   Copied from CRM [id number]. Topic: Appointments - Appointment Cancel/Reschedule >> Dec 29, 2023  9:22 AM Shelby Dessert H wrote: Patient called because she had a missed call, Patient missed (no show) their annual appointment and would like to make another one, patients callback number is [phone].She said she really needs an appointment because she needs to go over her meds, she has had some deaths in the family and has been out of town

## 2024-01-02 ENCOUNTER — Encounter: Admitting: Student

## 2024-01-03 ENCOUNTER — Other Ambulatory Visit: Payer: Self-pay

## 2024-01-03 ENCOUNTER — Encounter

## 2024-01-03 ENCOUNTER — Ambulatory Visit (INDEPENDENT_AMBULATORY_CARE_PROVIDER_SITE_OTHER): Admitting: Student

## 2024-01-03 VITALS — BP 142/89 | HR 60 | Temp 98.7°F | Ht 67.0 in | Wt 135.2 lb

## 2024-01-03 DIAGNOSIS — L300 Nummular dermatitis: Secondary | ICD-10-CM

## 2024-01-03 DIAGNOSIS — Z8639 Personal history of other endocrine, nutritional and metabolic disease: Secondary | ICD-10-CM

## 2024-01-03 DIAGNOSIS — F1210 Cannabis abuse, uncomplicated: Secondary | ICD-10-CM | POA: Diagnosis not present

## 2024-01-03 DIAGNOSIS — L308 Other specified dermatitis: Secondary | ICD-10-CM

## 2024-01-03 DIAGNOSIS — E7841 Elevated Lipoprotein(a): Secondary | ICD-10-CM

## 2024-01-03 DIAGNOSIS — E785 Hyperlipidemia, unspecified: Secondary | ICD-10-CM

## 2024-01-03 DIAGNOSIS — J4520 Mild intermittent asthma, uncomplicated: Secondary | ICD-10-CM

## 2024-01-03 MED ORDER — POTASSIUM CHLORIDE CRYS ER 10 MEQ PO TBCR: 10.0000 meq | EXTENDED_RELEASE_TABLET | Freq: Every day | ORAL | 0 refills | Status: AC

## 2024-01-03 MED ORDER — TRIAMCINOLONE ACETONIDE 0.1 % EX OINT: TOPICAL_OINTMENT | Freq: Two times a day (BID) | CUTANEOUS | 1 refills | Status: DC

## 2024-01-03 MED ORDER — BUDESONIDE-FORMOTEROL FUMARATE 80-4.5 MCG/ACT IN AERO: 2.0000 | INHALATION_SPRAY | Freq: Two times a day (BID) | RESPIRATORY_TRACT | 12 refills | Status: DC

## 2024-01-03 MED ORDER — POTASSIUM CHLORIDE CRYS ER 10 MEQ PO TBCR: 10.0000 meq | EXTENDED_RELEASE_TABLET | Freq: Every day | ORAL | 0 refills | Status: DC

## 2024-01-03 NOTE — Patient Instructions (Addendum)
 Thank you, Ms.Myonna L Dusing for allowing us  to provide your care today.    For your breathing:  Start Formoterol -budesonide  inhaler twice a day - always regardless of how your breathing is doing.  If on top of taking the formoterol -budersonide inhaler you get wheezing or short of breath you can use one puff of the levalbuterol  every 6 hours as needed. If that is not enough, you can also add on spiriva .   STOP Arunity and Qvar  for now.  Get your pulmonary function testing done.- Call the office in the next two weeks if you do not hear about your appointment for this.  For your Skin:   Apply the triamcinolone -eucerin cream twice a day. Avoid scratching.    For your cholesterol:   Start taking Ezetimibe  10mg  daily and the Atorvastatin  40mg  daily  For your allergies:   If you take Allegra  DO NOT take Citirizine.    Follow up: 1 month    Should you have any questions or concerns please call the internal medicine clinic at [phone].     Jose Ngo, MD [facility]

## 2024-01-03 NOTE — Assessment & Plan Note (Signed)
 Has well defined hyperpigmented plaques with residual hypopigmentation over the bilateral lower legs that are itchy and resolve with vaseline. Denies any bug bites. Pulses on the bilateral legs are strong.The plaques she has right now are hyperpigmented but she picks on the crust and leaves residual hypopigmentation. She can try an eucerin-TAC formulation BID, if too expensive she will call and can prescribe TAC separate from emollient. Will use this for two weeks and follow up with us  PRN.

## 2024-01-03 NOTE — Telephone Encounter (Signed)
 Pharmacy requesting a 90 day supply  Medication sent to pharmacy.

## 2024-01-03 NOTE — Progress Notes (Signed)
 CC:  Chief Complaint  Patient presents with   Discuss about medications.     Pt states she cannot remember what medications she supposed to take daily. Pt brought her medications with her.   HPI:  [name] is a 73 y.o. female living with a history stated below and presents today for the above. Please see problem based assessment and plan for additional details.  Past Medical History:  Diagnosis Date   Asthma in adult, mild intermittent, uncomplicated    Chronic anticoagulation    Chronic constipation    COVID-19 virus infection 10/19/2021   Eczema    GERD (gastroesophageal reflux disease)    H. pylori infection 09/24/2021   Identified on EGD biopsy.  Treatment will be discussed with her GI doctor.  SHe will need a very simple regimen which has no contraindications in combination with her existing medication regimen.     Hemoperitoneum 09/27/2021   Due to splenic lac occurring after colonoscopy on 09/24/21.  Xarelto  resumed.    History of kidney stones    Hx of vaginal bleeding, resolved, ultrasound completed 10/11/2018   Hypertension    Laceration of spleen    Presumed complication of colonoscopy 09/24/21.  Pain and hemoperitoneum.  Xarelto  resumed.  Short term opioid pain management.     Personal history of atrial fibrillation, s/p ablation, now in NSR but remains on anticoaulation 11/26/2015   Followed in cardiology clinic     Current Outpatient Medications on File Prior to Visit  Medication Sig Dispense Refill   alendronate  (FOSAMAX ) 70 MG tablet Take 1 tablet (70 mg total) by mouth every 7 (seven) days. Take with a full glass of water  on an empty stomach. 12 tablet 3   atorvastatin  (LIPITOR) 40 MG tablet Take 1 tablet (40 mg total) by mouth daily. 90 tablet 3   fexofenadine  (ALLEGRA  ALLERGY) 180 MG tablet Take 1 tablet (180 mg total) by mouth daily. 90 tablet 3   fluticasone  (FLONASE ) 50 MCG/ACT nasal spray Place 1 spray into both nostrils daily. 15.8 mL 2   levalbuterol   (XOPENEX  HFA) 45 MCG/ACT inhaler Inhale 1 puff into the lungs every 6 (six) hours as needed for shortness of breath. 1 each 5   linaclotide  (LINZESS ) 145 MCG CAPS capsule Take 1 capsule (145 mcg total) by mouth daily before breakfast. 30 capsule 11   losartan  (COZAAR ) 100 MG tablet Take 1 tablet (100 mg total) by mouth daily. 90 tablet 3   metoprolol  succinate (TOPROL -XL) 50 MG 24 hr tablet Take 1 tablet (50 mg total) by mouth daily. Take with or immediately following a meal. 30 tablet 5   montelukast  (SINGULAIR ) 10 MG tablet Take 1 tablet (10 mg total) by mouth at bedtime. 90 tablet 3   omeprazole  (PRILOSEC) 40 MG capsule TAKE 1 CAPSULE(40 MG) BY MOUTH IN THE MORNING AND AT BEDTIME 180 capsule 3   rivaroxaban  (XARELTO ) 20 MG TABS tablet Take 1 tablet (20 mg total) by mouth daily with supper. 30 tablet 11   spironolactone  (ALDACTONE ) 25 MG tablet Take 2 tablets (50 mg total) by mouth daily. For blood pressure. 60 tablet 11   tiotropium (SPIRIVA ) 18 MCG inhalation capsule Place 1 capsule (18 mcg total) into inhaler and inhale daily. 90 capsule 3   No current facility-administered medications on file prior to visit.    Family History  Problem Relation Age of Onset   Heart attack Mother        Young age   Breast cancer Sister  Late 18s; now s/p mastectomy    Lung cancer Brother        x 2   Cancer Sister    Cancer Sister    Cancer Sister    Colon cancer Neg Hx    Colon polyps Neg Hx    Esophageal cancer Neg Hx    Rectal cancer Neg Hx    Stomach cancer Neg Hx     Social History   Socioeconomic History   Marital status: Single    Spouse name: Not on file   Number of children: Not on file   Years of education: Not on file   Highest education level: Not on file  Occupational History   Occupation: Retired  Tobacco Use   Smoking status: Former    Current packs/day: 0.00    Types: Cigarettes    Quit date: 10/24/2015    Years since quitting: 8.2   Smokeless tobacco: Never   Vaping Use   Vaping status: Never Used  Substance and Sexual Activity   Alcohol use: Not Currently   Drug use: Not Currently    Types: Marijuana    Comment: Smoked marijuana in the past.   Sexual activity: Yes    Partners: Male    Birth control/protection: Post-menopausal  Other Topics Concern   Not on file  Social History Narrative   Lives alone in [place], [place] and has 2 daughters who are within driving distance   Enjoys watching soap operas, reading, exercise   Social Drivers of Health   Financial Resource Strain: Low Risk  (10/04/2022)   Overall Financial Resource Strain (CARDIA)    Difficulty of Paying Living Expenses: Not hard at all  Food Insecurity: No Food Insecurity (10/04/2022)   Hunger Vital Sign    Worried About Running Out of Food in the Last Year: Never true    Ran Out of Food in the Last Year: Never true  Transportation Needs: No Transportation Needs (10/04/2022)   PRAPARE - Administrator, Civil Service (Medical): No    Lack of Transportation (Non-Medical): No  Physical Activity: Insufficiently Active (10/04/2022)   Exercise Vital Sign    Days of Exercise per Week: 2 days    Minutes of Exercise per Session: 30 min  Stress: No Stress Concern Present (10/04/2022)   [employer] of Occupational Health - Occupational Stress Questionnaire    Feeling of Stress : Not at all  Social Connections: Socially Isolated (10/04/2022)   Social Connection and Isolation Panel [NHANES]    Frequency of Communication with Friends and Family: More than three times a week    Frequency of Social Gatherings with Friends and Family: Three times a week    Attends Religious Services: Never    Active Member of Clubs or Organizations: No    Attends Banker Meetings: Never    Marital Status: Separated  Intimate Partner Violence: Not At Risk (10/04/2022)   Humiliation, Afraid, Rape, and Kick questionnaire    Fear of Current or Ex-Partner: No    Emotionally  Abused: No    Physically Abused: No    Sexually Abused: No    Review of Systems: ROS negative except for what is noted on the assessment and plan.  Vitals:   01/03/24 1015 01/03/24 1027  BP: (!) 152/90 (!) 142/89  Pulse: 62 60  Temp: 98.7 F (37.1 C)   TempSrc: Oral   SpO2: 100%   Weight: 135 lb 3.2 oz (61.3 kg)   Height: 5\' 7"  (  1.702 m)     Physical Exam: Constitutional: well-appearing, in NAD HENT: normocephalic atraumatic, mucous membranes moist Eyes: conjunctiva non-erythematous Cardiovascular: regular rate and rhythm, no m/r/g Pulmonary/Chest: normal work of breathing on room air, lungs clear to auscultation bilaterally Abdominal: soft, non-tender, non-distended MSK: normal bulk and tone Neurological: alert & oriented x 3, no focal deficit Skin: warm and dry, hyperpigmented well-defined plaques with crust over the bilateral extremity with residual hypopigmentation.     Psych: normal mood and behavior   Lipid Panel     Component Value Date/Time   CHOL 247 (H) 02/15/2023 1030   TRIG 102 02/15/2023 1030   HDL 95 02/15/2023 1030   CHOLHDL 2.6 02/15/2023 1030   CHOLHDL 3.1 03/24/2010 0657   VLDL 11 03/24/2010 0657   LDLCALC 135 (H) 02/15/2023 1030   LABVLDL 17 02/15/2023 1030   The 10-year ASCVD risk score (Arnett DK, et al., 2019) is: 25.6%   Values used to calculate the score:     Age: 58 years     Sex: Female     Is Non-Hispanic African American: Yes     Diabetic: No     Tobacco smoker: No     Systolic Blood Pressure: 142 mmHg     Is BP treated: Yes     HDL Cholesterol: 95 mg/dL     Total Cholesterol: 247 mg/dL  Assessment & Plan:   Patient discussed with Dr. Lelia Putnam  Hyperlipidemia Has significant risk and currently only taking atorvastatin  40mg  daily. She is tolerating this well. Has a bottle of ezetimibe  but she is not sure if she should be taking it. Instructed her to take both.    H/O hypokalemia Has been taking potassium 10meq. Last  potassium in January was 3.4. Will send refill today but will need monitoring at next visit.  Asthma in adult, mild intermittent, uncomplicated Has a history of asthma but it seems she never got PFTs done. Significant marijuana use (smokes it). Brought in meds and doing medicine reconcilliation. She had two ICS, takes spiriva  daily, and also levalbuterol  PRN. She will hold off from taking these and start formoterol -budesonide  BID. She will use levalbuterol  and ipratropium PRN and get PFTs.   Nummular eczema Has well defined hyperpigmented plaques with residual hypopigmentation over the bilateral lower legs that are itchy and resolve with vaseline. Denies any bug bites. Pulses on the bilateral legs are strong.The plaques she has right now are hyperpigmented but she picks on the crust and leaves residual hypopigmentation. She can try an eucerin-TAC formulation BID, if too expensive she will call and can prescribe TAC separate from emollient. Will use this for two weeks and follow up with us  PRN.   Jose Ngo, MD [facility] Internal Medicine, PGY-1 Phone: [phone] Date 01/03/2024 Time 3:48 PM

## 2024-01-03 NOTE — Assessment & Plan Note (Addendum)
 Has significant risk and currently only taking atorvastatin  40mg  daily. She is tolerating this well. Has a bottle of ezetimibe  but she is not sure if she should be taking it. Instructed her to take both.

## 2024-01-03 NOTE — Assessment & Plan Note (Addendum)
 Has been taking potassium 10meq. Last potassium in January was 3.4. Will send refill today but will need monitoring at next visit.

## 2024-01-03 NOTE — Assessment & Plan Note (Signed)
 Has a history of asthma but it seems she never got PFTs done. Significant marijuana use (smokes it). Brought in meds and doing medicine reconcilliation. She had two ICS, takes spiriva  daily, and also levalbuterol  PRN. She will hold off from taking these and start formoterol -budesonide  BID. She will use levalbuterol  and ipratropium PRN and get PFTs.

## 2024-01-04 ENCOUNTER — Ambulatory Visit: Payer: Self-pay

## 2024-01-04 ENCOUNTER — Ambulatory Visit

## 2024-01-04 VITALS — Ht 67.0 in | Wt 135.0 lb

## 2024-01-04 DIAGNOSIS — Z0000 Encounter for general adult medical examination without abnormal findings: Secondary | ICD-10-CM

## 2024-01-04 NOTE — Telephone Encounter (Signed)
   Additional Notes: Called patient. Patient states she has already spoken with someone from the office and has no further questions.  Copied from CRM [id number]. Topic: Clinical - Prescription Issue >> Jan 04, 2024  9:44 AM Tiffany H wrote: Reason for CRM: Patient called to advise that triamcinolone  0.1% ointment-Eucerin equivalent cream 1:1 mixture. Please clarify how much of the Eucerin patient should be applying twice per day. According to [employer], fingertip units (1-10) can be used. Reason for Disposition  Caller has already spoken with the PCP and has no further questions.  Protocols used: No Contact or Duplicate Contact Call-A-AH

## 2024-01-04 NOTE — Progress Notes (Signed)
 Internal Medicine Clinic Attending  Case discussed with the resident at the time of the visit.  We reviewed the resident's history and exam and pertinent patient test results.  I agree with the assessment, diagnosis, and plan of care documented in the resident's note. Most consistent with nummular eczema.  Will treat with combo emolient and topical corticosteroid.  Also fits with hx asthma which we are optimizing her control by switching to ICS/LABA for maintenance therapy.  Will evaluate further with PFT's given daily marijuana use for many years make sure she is not developing lung damage.

## 2024-01-04 NOTE — Progress Notes (Signed)
 Because this visit was a virtual/telehealth visit,  certain criteria was not obtained, such a blood pressure, CBG if applicable, and timed get up and go. Any medications not marked as "taking" were not mentioned during the medication reconciliation part of the visit. Any vitals not documented were not able to be obtained due to this being a telehealth visit or patient was unable to self-report a recent blood pressure reading due to a lack of equipment at home via telehealth. Vitals that have been documented are verbally provided by the patient.   Subjective:   [name] is a 73 y.o. who presents for a Medicare Wellness preventive visit.  As a reminder, Annual Wellness Visits don't include a physical exam, and some assessments may be limited, especially if this visit is performed virtually. We may recommend an in-person visit if needed.  Visit Complete: Virtual I connected with  [name] on 01/04/24 by a audio enabled telemedicine application and verified that I am speaking with the correct person using two identifiers.  Patient Location: Home  Provider Location: Office/Clinic  I discussed the limitations of evaluation and management by telemedicine. The patient expressed understanding and agreed to proceed.  Vital Signs: Because this visit was a virtual/telehealth visit, some criteria may be missing or patient reported. Any vitals not documented were not able to be obtained and vitals that have been documented are patient reported.  VideoDeclined- This patient declined Librarian, academic. Therefore the visit was completed with audio only.  Persons Participating in Visit: Patient.  AWV Questionnaire: No: Patient Medicare AWV questionnaire was not completed prior to this visit.  Cardiac Risk Factors include: advanced age (>57men, >26 women);family history of premature cardiovascular disease;hypertension;sedentary lifestyle     Objective:      Today's Vitals   01/04/24 1552  Weight: 135 lb (61.2 kg)  Height: 5\' 7"  (1.702 m)  PainSc: 0-No pain   Body mass index is 21.14 kg/m.     01/04/2024    3:57 PM 01/03/2024   10:17 AM 09/17/2023   12:30 PM 09/16/2023    9:59 AM 08/30/2023    7:07 AM 06/02/2023    9:51 AM 02/15/2023    8:45 AM  Advanced Directives  Does Patient Have a Medical Advance Directive? No No No No No No No  Would patient like information on creating a medical advance directive? No - Patient declined No - Patient declined    No - Patient declined No - Patient declined    Current Medications (verified) Outpatient Encounter Medications as of 01/04/2024  Medication Sig   alendronate  (FOSAMAX ) 70 MG tablet Take 1 tablet (70 mg total) by mouth every 7 (seven) days. Take with a full glass of water  on an empty stomach.   atorvastatin  (LIPITOR) 40 MG tablet Take 1 tablet (40 mg total) by mouth daily.   budesonide -formoterol  (SYMBICORT) 80-4.5 MCG/ACT inhaler Inhale 2 puffs into the lungs 2 (two) times daily.   fexofenadine  (ALLEGRA  ALLERGY) 180 MG tablet Take 1 tablet (180 mg total) by mouth daily.   fluticasone  (FLONASE ) 50 MCG/ACT nasal spray Place 1 spray into both nostrils daily.   levalbuterol  (XOPENEX  HFA) 45 MCG/ACT inhaler Inhale 1 puff into the lungs every 6 (six) hours as needed for shortness of breath.   linaclotide  (LINZESS ) 145 MCG CAPS capsule Take 1 capsule (145 mcg total) by mouth daily before breakfast.   losartan  (COZAAR ) 100 MG tablet Take 1 tablet (100 mg total) by mouth daily.  metoprolol  succinate (TOPROL -XL) 50 MG 24 hr tablet Take 1 tablet (50 mg total) by mouth daily. Take with or immediately following a meal.   montelukast  (SINGULAIR ) 10 MG tablet Take 1 tablet (10 mg total) by mouth at bedtime.   omeprazole  (PRILOSEC) 40 MG capsule TAKE 1 CAPSULE(40 MG) BY MOUTH IN THE MORNING AND AT BEDTIME   potassium chloride  (KLOR-CON  M) 10 MEQ tablet Take 1 tablet (10 mEq total) by mouth daily.    rivaroxaban  (XARELTO ) 20 MG TABS tablet Take 1 tablet (20 mg total) by mouth daily with supper.   spironolactone  (ALDACTONE ) 25 MG tablet Take 2 tablets (50 mg total) by mouth daily. For blood pressure.   tiotropium (SPIRIVA ) 18 MCG inhalation capsule Place 1 capsule (18 mcg total) into inhaler and inhale daily.   triamcinolone  0.1% oint-Eucerin equivalent cream 1:1 mixture Apply topically 2 (two) times daily.   No facility-administered encounter medications on file as of 01/04/2024.    Allergies (verified) Albuterol , Percocet [oxycodone -acetaminophen ], Augmentin  [amoxicillin -pot clavulanate], Celecoxib, and Tramadol    History: Past Medical History:  Diagnosis Date   Asthma in adult, mild intermittent, uncomplicated    Chronic anticoagulation    Chronic constipation    COVID-19 virus infection 10/19/2021   Eczema    GERD (gastroesophageal reflux disease)    H. pylori infection 09/24/2021   Identified on EGD biopsy.  Treatment will be discussed with her GI doctor.  SHe will need a very simple regimen which has no contraindications in combination with her existing medication regimen.     Hemoperitoneum 09/27/2021   Due to splenic lac occurring after colonoscopy on 09/24/21.  Xarelto  resumed.    History of kidney stones    Hx of vaginal bleeding, resolved, ultrasound completed 10/11/2018   Hypertension    Laceration of spleen    Presumed complication of colonoscopy 09/24/21.  Pain and hemoperitoneum.  Xarelto  resumed.  Short term opioid pain management.     Personal history of atrial fibrillation, s/p ablation, now in NSR but remains on anticoaulation 11/26/2015   Followed in cardiology clinic    Past Surgical History:  Procedure Laterality Date   ATRIAL FIBRILLATION ABLATION N/A 09/30/2017   Procedure: ATRIAL FIBRILLATION ABLATION;  Surgeon: Jolly Needle, MD;  Location: [facility] INVASIVE CV LAB;  Service: Cardiovascular;  Laterality: N/A;   BIOPSY  09/24/2021   Procedure: BIOPSY;  Surgeon: Daina Drum, MD;  Location: [facility] ENDOSCOPY;  Service: Gastroenterology;;   CHOLECYSTECTOMY N/A 12/08/2018   Procedure: LAPAROSCOPIC CHOLECYSTECTOMY WITH INTRAOPERATIVE CHOLANGIOGRAM;  Surgeon: Caralyn Chandler, MD;  Location: [facility] OR;  Service: General;  Laterality: N/A;   COLONOSCOPY WITH PROPOFOL  N/A 09/24/2021   Procedure: COLONOSCOPY WITH PROPOFOL ;  Surgeon: Daina Drum, MD;  Location: [facility] ENDOSCOPY;  Service: Gastroenterology;  Laterality: N/A;   ERCP N/A 12/09/2018   Procedure: ENDOSCOPIC RETROGRADE CHOLANGIOPANCREATOGRAPHY (ERCP);  Surgeon: Alvis Jourdain, MD;  Location: [facility] ENDOSCOPY;  Service: Endoscopy;  Laterality: N/A;   ESOPHAGOGASTRODUODENOSCOPY (EGD) WITH PROPOFOL  N/A 09/24/2021   Procedure: ESOPHAGOGASTRODUODENOSCOPY (EGD) WITH PROPOFOL ;  Surgeon: Daina Drum, MD;  Location: [facility] ENDOSCOPY;  Service: Gastroenterology;  Laterality: N/A;   POLYPECTOMY  09/24/2021   Procedure: POLYPECTOMY;  Surgeon: Daina Drum, MD;  Location: [facility] ENDOSCOPY;  Service: Gastroenterology;;   [name]  12/09/2018   Procedure: [name];  Surgeon: Alvis Jourdain, MD;  Location: [facility] ENDOSCOPY;  Service: Endoscopy;;   TONSILLECTOMY     TUBAL LIGATION     Family History  Problem Relation Age of Onset   Heart  attack Mother        Young age   Breast cancer Sister        Late 25s; now s/p mastectomy    Lung cancer Brother        x 2   Cancer Sister    Cancer Sister    Cancer Sister    Colon cancer Neg Hx    Colon polyps Neg Hx    Esophageal cancer Neg Hx    Rectal cancer Neg Hx    Stomach cancer Neg Hx    Social History   Socioeconomic History   Marital status: Single    Spouse name: Not on file   Number of children: Not on file   Years of education: Not on file   Highest education level: Not on file  Occupational History   Occupation: Retired  Tobacco Use   Smoking status: Former    Current packs/day: 0.00    Types: Cigarettes    Quit date: 10/24/2015    Years since quitting: 8.2   Smokeless  tobacco: Never  Vaping Use   Vaping status: Never Used  Substance and Sexual Activity   Alcohol use: Not Currently   Drug use: Not Currently    Types: Marijuana    Comment: Smoked marijuana in the past.   Sexual activity: Yes    Partners: Male    Birth control/protection: Post-menopausal  Other Topics Concern   Not on file  Social History Narrative   Lives alone in [place], [place] and has 2 daughters who are within driving distance   Enjoys watching soap operas, reading, exercise   Social Drivers of Health   Financial Resource Strain: Low Risk  (01/04/2024)   Overall Financial Resource Strain (CARDIA)    Difficulty of Paying Living Expenses: Not hard at all  Food Insecurity: No Food Insecurity (01/04/2024)   Hunger Vital Sign    Worried About Running Out of Food in the Last Year: Never true    Ran Out of Food in the Last Year: Never true  Transportation Needs: No Transportation Needs (01/04/2024)   PRAPARE - Administrator, Civil Service (Medical): No    Lack of Transportation (Non-Medical): No  Physical Activity: Insufficiently Active (01/04/2024)   Exercise Vital Sign    Days of Exercise per Week: 2 days    Minutes of Exercise per Session: 30 min  Stress: No Stress Concern Present (01/04/2024)   [employer] of Occupational Health - Occupational Stress Questionnaire    Feeling of Stress : Not at all  Social Connections: Socially Isolated (01/04/2024)   Social Connection and Isolation Panel [NHANES]    Frequency of Communication with Friends and Family: More than three times a week    Frequency of Social Gatherings with Friends and Family: Three times a week    Attends Religious Services: Never    Active Member of Clubs or Organizations: No    Attends Banker Meetings: Never    Marital Status: Separated    Tobacco Counseling Counseling given: Not Answered    Clinical Intake:  Pre-visit preparation completed: Yes  Pain : No/denies  pain Pain Score: 0-No pain     BMI - recorded: 21.14 Nutritional Status: BMI of 19-24  Normal Nutritional Risks: None Diabetes: No  Lab Results  Component Value Date   HGBA1C 5.3 03/17/2016     How often do you need to have someone help you when you read instructions, pamphlets, or other written materials from your  doctor or pharmacy?: 1 - Never  Interpreter Needed?: No  Information entered by :: Cortny Bambach N. Aily Tzeng, LPN.   Activities of Daily Living     01/04/2024    3:58 PM 01/03/2024   10:17 AM  In your present state of health, do you have any difficulty performing the following activities:  Hearing? 0 0  Vision? 0 0  Difficulty concentrating or making decisions? 0 0  Walking or climbing stairs? 0 0  Dressing or bathing? 0 0  Doing errands, shopping? 0 0  Preparing Food and eating ? N   Using the Toilet? N   In the past six months, have you accidently leaked urine? N   Do you have problems with loss of bowel control? N   Managing your Medications? N   Managing your Finances? N   Housekeeping or managing your Housekeeping? N     Patient Care Team: Sherol Dixie, MD as PCP - General (Internal Medicine) Jolly Needle, MD (Inactive) as PCP - Electrophysiology (Cardiology) Caralyn Chandler, MD as Consulting Physician (General Surgery) Denette Finner, PA-C as Physician Assistant (Physician Assistant)  Indicate any recent Medical Services you may have received from other than [facility] providers in the past year (date may be approximate).     Assessment:    This is a routine wellness examination for [name].  Hearing/Vision screen Hearing Screening - Comments:: Denies hearing difficulties.  Vision Screening - Comments:: No recent eye exam.   Goals Addressed             This Visit's Progress    My goal for 2025 is to stay independent.         Depression Screen     01/03/2024   10:15 AM 06/02/2023    9:50 AM 02/15/2023    8:44 AM 01/06/2023   10:47  AM 12/02/2022    9:26 AM 10/12/2022   11:50 AM 10/12/2022    8:39 AM  PHQ 2/9 Scores  PHQ - 2 Score 2 0 0 0 0 1 1  PHQ- 9 Score 2     1 1     Fall Risk     01/04/2024    3:57 PM 01/03/2024   10:15 AM 06/02/2023    9:50 AM 02/15/2023    8:44 AM 01/06/2023   11:28 AM  Fall Risk   Falls in the past year? 0 0 0 0 0  Number falls in past yr: 0 0 0 0 0  Injury with Fall? 0 0 0 0 0  Risk for fall due to : No Fall Risks No Fall Risks No Fall Risks  No Fall Risks  Follow up Falls prevention discussed;Falls evaluation completed Falls evaluation completed Falls evaluation completed;Falls prevention discussed Falls evaluation completed Falls evaluation completed    MEDICARE RISK AT HOME:  Medicare Risk at Home Any stairs in or around the home?: No If so, are there any without handrails?: No Home free of loose throw rugs in walkways, pet beds, electrical cords, etc?: Yes Adequate lighting in your home to reduce risk of falls?: Yes Life alert?: No Use of a cane, walker or w/c?: No Grab bars in the bathroom?: No Shower chair or bench in shower?: No Elevated toilet seat or a handicapped toilet?: No  TIMED UP AND GO:  Was the test performed?  No  Cognitive Function: 6CIT completed    01/04/2024    4:01 PM  MMSE - Mini Mental State Exam  Not completed: Unable to complete  01/04/2024    3:55 PM 10/12/2022   11:50 AM  6CIT Screen  What Year? 0 points 0 points  What month? 0 points 0 points  What time? 0 points 0 points  Count back from 20 0 points 0 points  Months in reverse 0 points 4 points  Repeat phrase 0 points 2 points  Total Score 0 points 6 points    Immunizations Immunization History  Administered Date(s) Administered   Fluad Quad(high Dose 65+) 06/11/2019   Fluad Trivalent(High Dose 65+) 06/02/2023   Influenza,inj,Quad PF,6+ Mos 09/24/2016, 06/17/2017, 07/24/2020   Influenza-Unspecified 05/23/2018, 06/09/2022   PFIZER(Purple Top)SARS-COV-2 Vaccination 10/27/2019,  11/26/2019, 07/31/2020, 06/09/2022   Pneumococcal Conjugate-13 02/16/2017   Pneumococcal Polysaccharide-23 06/10/2015   Tdap 06/01/2015   Zoster Recombinant(Shingrix) 06/11/2019, 08/28/2019    Screening Tests Health Maintenance  Topic Date Due   COVID-19 Vaccine (5 - 2024-25 season) 04/24/2023   INFLUENZA VACCINE  03/23/2024   Medicare Annual Wellness (AWV)  01/03/2025   DTaP/Tdap/Td (2 - Td or Tdap) 05/31/2025   Colonoscopy  09/24/2026   Pneumonia Vaccine 54+ Years old  Completed   DEXA SCAN  Completed   Hepatitis C Screening  Completed   Zoster Vaccines- Shingrix  Completed   HPV VACCINES  Aged Out   Meningococcal B Vaccine  Aged Out    Health Maintenance  Health Maintenance Due  Topic Date Due   COVID-19 Vaccine (5 - 2024-25 season) 04/24/2023   Health Maintenance Items Addressed: Yes Patient aware of current care gaps.  Immunization record was verified by [facility] and updated in patient's chart.  Additional Screening:  Vision Screening: Recommended annual ophthalmology exams for early detection of glaucoma and other disorders of the eye.  Dental Screening: Recommended annual dental exams for proper oral hygiene  Community Resource Referral / Chronic Care Management: CRR required this visit?  No   CCM required this visit?  No   Plan:    I have personally reviewed and noted the following in the patient's chart:   Medical and social history Use of alcohol, tobacco or illicit drugs  Current medications and supplements including opioid prescriptions. Patient is not currently taking opioid prescriptions. Functional ability and status Nutritional status Physical activity Advanced directives List of other physicians Hospitalizations, surgeries, and ER visits in previous 12 months Vitals Screenings to include cognitive, depression, and falls Referrals and appointments  In addition, I have reviewed and discussed with patient certain preventive protocols, quality  metrics, and best practice recommendations. A written personalized care plan for preventive services as well as general preventive health recommendations were provided to patient.   Margette Sheldon, LPN   11/29/8117   After Visit Summary: (MyChart) Due to this being a telephonic visit, the after visit summary with patients personalized plan was offered to patient via MyChart   Notes: Patient aware of current care gaps.  Immunization record was verified by [facility] and updated in patient's chart.

## 2024-01-04 NOTE — Patient Instructions (Signed)
 [name] , Thank you for taking time out of your busy schedule to complete your Annual Wellness Visit with me. I enjoyed our conversation and look forward to speaking with you again next year. I, as well as your care team,  appreciate your ongoing commitment to your health goals. Please review the following plan we discussed and let me know if I can assist you in the future. Your Game plan/ To Do List    Referrals: If you haven't heard from the office you've been referred to, please reach out to them at the phone provided.   Follow up Visits: Next Medicare AWV with our clinical staff: 01/09/2025 at 3:40 pm PHONE VISIT   Have you seen your provider in the last 6 months (3 months if uncontrolled diabetes)? Yes, patient was seen last on 01/03/2024   Clinician Recommendations:  Aim for 30 minutes of exercise or brisk walking, 6-8 glasses of water , and 5 servings of fruits and vegetables each day.       This is a list of the screening recommended for you and due dates:  Health Maintenance  Topic Date Due   COVID-19 Vaccine (5 - 2024-25 season) 04/24/2023   Flu Shot  03/23/2024   Medicare Annual Wellness Visit  01/03/2025   DTaP/Tdap/Td vaccine (2 - Td or Tdap) 05/31/2025   Colon Cancer Screening  09/24/2026   Pneumonia Vaccine  Completed   DEXA scan (bone density measurement)  Completed   Hepatitis C Screening  Completed   Zoster (Shingles) Vaccine  Completed   HPV Vaccine  Aged Out   Meningitis B Vaccine  Aged Out    Advanced directives: (Declined) Advance directive discussed with you today. Even though you declined this today, please call our office should you change your mind, and we can give you the proper paperwork for you to fill out. Advance Care Planning is important because it:  [x]  Makes sure you receive the medical care that is consistent with your values, goals, and preferences  [x]  It provides guidance to your family and loved ones and reduces their decisional burden about  whether or not they are making the right decisions based on your wishes.  Follow the link provided in your after visit summary or read over the paperwork we have mailed to you to help you started getting your Advance Directives in place. If you need assistance in completing these, please reach out to us  so that we can help you!  See attachments for Preventive Care and Fall Prevention Tips.

## 2024-01-05 NOTE — Progress Notes (Signed)
 Internal Medicine Attending:  I reviewed the AWV findings of the medical professional who conducted the visit. I was present in the office suite and immediately available to provide assistance and direction throughout the time the service was provided.

## 2024-01-06 ENCOUNTER — Telehealth: Payer: Self-pay | Admitting: Internal Medicine

## 2024-01-09 ENCOUNTER — Telehealth: Payer: Self-pay | Admitting: *Deleted

## 2024-01-09 ENCOUNTER — Other Ambulatory Visit: Payer: Self-pay

## 2024-01-09 DIAGNOSIS — I10 Essential (primary) hypertension: Secondary | ICD-10-CM

## 2024-01-09 MED ORDER — METOPROLOL SUCCINATE ER 50 MG PO TB24: 50.0000 mg | ORAL_TABLET | Freq: Every day | ORAL | 5 refills | Status: DC

## 2024-01-09 NOTE — Telephone Encounter (Signed)
 Copied from CRM [id number]. Topic: Clinical - Prescription Issue >> Jan 05, 2024  9:11 AM Arlie Benedict B wrote: Reason for CRM: triamcinolone  0.1% oint-Eucerin equivalent cream 1:1 mixture; [phone], Ms. Arey called in regards to the prescription cream listed above. Patient states that the pharmacy has called her to advise they cannot fill the prescription but it doesn't have the proper administering directions on the prescription. Patient has requested that some from the care team call and advise her on the update for the medication. >> Jan 05, 2024 10:54 AM Blair Bumpers wrote: Patient called back to see how much of the triamcinolone  0.1% oint-Eucerin cream she's suppose to use. She states there is no amount in the directions and the pharmacy is needing to know. Please give both the patient and pharmacy a call to advise.

## 2024-01-10 ENCOUNTER — Other Ambulatory Visit: Payer: Self-pay | Admitting: Student

## 2024-01-10 ENCOUNTER — Telehealth: Payer: Self-pay

## 2024-01-10 DIAGNOSIS — L300 Nummular dermatitis: Secondary | ICD-10-CM

## 2024-01-10 MED ORDER — TRIAMCINOLONE ACETONIDE 0.1 % EX CREA: TOPICAL_CREAM | CUTANEOUS | 1 refills | Status: DC

## 2024-01-10 NOTE — Telephone Encounter (Signed)
 I called [facility] - the pharmacist, Rob. stated need new rx with ratio for the cream. Informed him rx sent on 5/13 has the ratio: he stated they did not get it: "triamcinolone  0.1% oint-Eucerin equivalent cream 1:1 mixture      Components  Component Recipe Dispense Quantity  triamcinolone  ointment 0.1 % Oint 1 Part 20 g  eucerin Crea 1 Part 20 g         Verbal order given to Rob.  I called pt to let her know but no answer; left message on pt's vm.

## 2024-01-10 NOTE — Telephone Encounter (Addendum)
 Prior Authorization for patient (triamcinolone  cream (KENALOG ) 0.1 % ) came through on cover my meds was submitted with last office notes awaiting approval or denial.  KEY:[id number]

## 2024-01-10 NOTE — Progress Notes (Signed)
 Spoke with patient about applying TAC over affected areas on her legs and then wait for that to dry. She is to use Triamcinolone  0.1% BID. Once dry, she can apply moisturizer of her choice on her BLE as needed.

## 2024-01-10 NOTE — Telephone Encounter (Signed)
 Copied from CRM [id number]. Topic: General - Other >> Jan 10, 2024 11:30 AM Shelby Dessert H wrote: Reason for CRM: Reconnecting patient with Jada from the clinic. The doctor called and made some claims and her doctor explained that if her insurance wouldn't cover it she would call and send something else for the patient that her insurance would cover, please give the patient a callback at [phone].

## 2024-01-11 ENCOUNTER — Other Ambulatory Visit: Payer: Self-pay

## 2024-01-11 MED ORDER — MONTELUKAST SODIUM 10 MG PO TABS: 10.0000 mg | ORAL_TABLET | Freq: Every day | ORAL | 3 refills | Status: AC

## 2024-01-11 NOTE — Telephone Encounter (Signed)
 This medication or product is on your plans list of covered drugs. Prior Authorization is not required at this time. If your pharmacy has questions regarding the processing of your prescription, please have them call the [facility] pharmacy help desk at  [phone].

## 2024-01-18 ENCOUNTER — Inpatient Hospital Stay (HOSPITAL_COMMUNITY): Admission: RE | Admit: 2024-01-18 | Source: Ambulatory Visit

## 2024-01-19 ENCOUNTER — Ambulatory Visit (HOSPITAL_COMMUNITY)
Admission: RE | Admit: 2024-01-19 | Discharge: 2024-01-19 | Disposition: A | Discharge status: Home / Self Care | Source: Ambulatory Visit | Attending: Internal Medicine | Admitting: Internal Medicine

## 2024-01-19 DIAGNOSIS — J4520 Mild intermittent asthma, uncomplicated: Secondary | ICD-10-CM | POA: Insufficient documentation

## 2024-01-19 DIAGNOSIS — F1210 Cannabis abuse, uncomplicated: Secondary | ICD-10-CM | POA: Insufficient documentation

## 2024-01-19 LAB — PULMONARY FUNCTION TEST
FEF 25-75 Post: 1.02 L/s
FEF 25-75 Pre: 0.78 L/s
FEF2575-%Change-Post: 30 %
FEF2575-%Pred-Post: 55 %
FEF2575-%Pred-Pre: 42 %
FEV1-%Change-Post: 7 %
FEV1-%Pred-Post: 73 %
FEV1-%Pred-Pre: 68 %
FEV1-Post: 1.76 L
FEV1-Pre: 1.64 L
FEV1FVC-%Change-Post: 0 %
FEV1FVC-%Pred-Pre: 83 %
FEV6-%Change-Post: 7 %
FEV6-%Pred-Post: 87 %
FEV6-%Pred-Pre: 82 %
FEV6-Post: 2.67 L
FEV6-Pre: 2.5 L
FEV6FVC-%Change-Post: 0 %
FEV6FVC-%Pred-Post: 100 %
FEV6FVC-%Pred-Pre: 100 %
FVC-%Change-Post: 6 %
FVC-%Pred-Post: 87 %
FVC-%Pred-Pre: 82 %
FVC-Post: 2.79 L
FVC-Pre: 2.62 L
Post FEV1/FVC ratio: 63 %
Post FEV6/FVC ratio: 96 %
Pre FEV1/FVC ratio: 63 %
Pre FEV6/FVC Ratio: 95 %

## 2024-01-19 MED ORDER — LEVALBUTEROL HCL 0.63 MG/3ML IN NEBU
0.6300 mg | INHALATION_SOLUTION | Freq: Once | RESPIRATORY_TRACT | Status: AC
  Administered 2024-01-19: 0.63 mg via RESPIRATORY_TRACT
  Filled 2024-01-19: qty 3

## 2024-01-24 ENCOUNTER — Other Ambulatory Visit: Payer: Self-pay | Admitting: Internal Medicine

## 2024-01-24 DIAGNOSIS — I10 Essential (primary) hypertension: Secondary | ICD-10-CM

## 2024-01-24 NOTE — Telephone Encounter (Signed)
 Medication sent to pharmacy

## 2024-02-23 ENCOUNTER — Other Ambulatory Visit: Payer: Self-pay

## 2024-02-23 DIAGNOSIS — K219 Gastro-esophageal reflux disease without esophagitis: Secondary | ICD-10-CM

## 2024-02-23 MED ORDER — OMEPRAZOLE 40 MG PO CPDR: 40.0000 mg | DELAYED_RELEASE_CAPSULE | Freq: Every day | ORAL | 3 refills | Status: AC

## 2024-02-28 ENCOUNTER — Ambulatory Visit: Payer: Self-pay

## 2024-02-28 NOTE — Telephone Encounter (Signed)
 FYI Only or Action Required?: Action required by provider: patient requesting medication sent into her pharmacy.  Patient was last seen in primary care on 01/03/2024 by Volney Leash, MD.  Called Nurse Triage reporting Neck Pain.  Symptoms began several weeks ago.  Interventions attempted: OTC medications: tylenol  and Rest, hydration, or home remedies.  Symptoms are: unchanged.  Triage Disposition: See PCP When Office is Open (Within 3 Days)-patient is wanting PCP recommendations-if medication can be sent in or if patient is needing to be seen in the office.  Patient/caregiver understands and will follow disposition?: No, wishes to speak with PCP  Copied from CRM [id number]. Topic: Clinical - Red Word Triage >> Feb 28, 2024  9:59 AM Rosaria A wrote: Red Word that prompted transfer to Nurse Triage: Patient states that she is having pain in the back of her neck going up to the top of her head to her temple. It hurting real bad. Reason for Disposition  [1] MODERATE neck pain (e.g., interferes with normal activities) AND [2] present > 3 days  Answer Assessment - Initial Assessment Questions 1. ONSET: When did the pain begin?      Started two weeks ago 2. LOCATION: Where does it hurt?      Back of neck that goes up into the top of head 3. PATTERN Does the pain come and go, or has it been constant since it started?      Comes and goes 4. SEVERITY: How bad is the pain?  (Scale 1-10; or mild, moderate, severe)   - NO PAIN (0): no pain or only slight stiffness    - MILD (1-3): doesn't interfere with normal activities    - MODERATE (4-7): interferes with normal activities or awakens from sleep    - SEVERE (8-10):  excruciating pain, unable to do any normal activities      7 out of 10 5. RADIATION: Does the pain go anywhere else, shoot into your arms?     no 6. CORD SYMPTOMS: Any weakness or numbness of the arms or legs?     no 7. CAUSE: What do you think is causing  the neck pain?     unsure 8. NECK OVERUSE: Any recent activities that involved turning or twisting the neck?     no 9. OTHER SYMPTOMS: Do you have any other symptoms? (e.g., headache, fever, chest pain, difficulty breathing, neck swelling)     Headache  Patient reports taking tylenol  to help with pain. Patient reports pain started two weeks ago-patient was told to try new pillows to see if that would help. Patient reports changing pillows didn't help with her pain level. Patient is asking if medication can be sent into her pharmacy to help with the pain. Patient states if she needs to be seen she will come in but would like to see if medication like a muscle relaxer can be sent in. Please call patient to follow up.  Protocols used: Neck Pain or Stiffness-A-AH

## 2024-02-29 NOTE — Progress Notes (Unsigned)
 Patient name: [name] Date of birth: 12/10/1950 Date of visit: 03/01/24  Type of visit: Acute Office Visit   Subjective   Chief concern:  Chief Complaint  Patient presents with   Neck Pain   Results    [name] is a 73 y.o. female with a PMHx of asthma, HTN, afib s/p ablation, and eczema who presents to [facility] clinic for evaluation of sore neck.  The 8/10 pain she experiences is in the back of her neck radiating to the back of both portions of her head. The pain started 2 weeks ago. Describes the right side of her neck around her trapezius swells up, feels like muscle spasms, then radiates into her head. The pain comes and goes. Usually she wakes up and it's the worst at that point. Hot water  from the shower helps the pain. Bought new pillows and tried different sleeping positions but that didn't help. Taking tylenol  tried a heating pad. Patient also using a cream but she can't remember the name. Yesterday her pain radiated down into her left arm and felt like a tingling. Associated blurry vision that only comes when she has this pain.    Notes she had this problem when she is much younger, but notes that it was related to stress buildup in her shoulders.   Patient Active Problem List   Diagnosis Date Noted   Cervical strain 03/01/2024   Vitamin D  deficiency 12/03/2022   History of impacted cerumen 10/05/2022   Anxiety about health 09/30/2022   History of colonic polyps    H/O hypokalemia 10/29/2020   Abdominal aortic atherosclerosis (HCC) 10/22/2020   Seasonal and perennial allergic (presumed) rhinitis 07/25/2020   Asthma in adult, mild intermittent, uncomplicated    Chronic recurrent  abdominal pain associated with bowel irregularity and gas; suspected IBS 07/24/2020   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 (HCC) 07/23/2020   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 02/14/2020   Non-cardiac chest pain 07/23/2019   Primary osteoarthritis involving  multiple joints, primarily L knee 06/14/2018   Sinusitis nasal 11/02/2017   Osteoporosis without current pathological fracture 04/15/2017   Chronic constipation 10/19/2016   Anemia 10/19/2016   Hyperlipidemia 03/18/2016   Nummular eczema 03/17/2016   GERD (gastroesophageal reflux disease)    Personal history of atrial fibrillation, now s/p ablation in [facility] 11/26/2015   Marijuana abuse 06/11/2015   Hypertension      Past Surgical History:  Procedure Laterality Date   ATRIAL FIBRILLATION ABLATION N/A 09/30/2017   Procedure: ATRIAL FIBRILLATION ABLATION;  Surgeon: Kelsie Agent, MD;  Location: [facility] INVASIVE CV LAB;  Service: Cardiovascular;  Laterality: N/A;   BIOPSY  09/24/2021   Procedure: BIOPSY;  Surgeon: Federico Rosario BROCKS, MD;  Location: [facility] ENDOSCOPY;  Service: Gastroenterology;;   CHOLECYSTECTOMY N/A 12/08/2018   Procedure: LAPAROSCOPIC CHOLECYSTECTOMY WITH INTRAOPERATIVE CHOLANGIOGRAM;  Surgeon: Curvin Deward MOULD, MD;  Location: [facility] OR;  Service: General;  Laterality: N/A;   COLONOSCOPY WITH PROPOFOL  N/A 09/24/2021   Procedure: COLONOSCOPY WITH PROPOFOL ;  Surgeon: Federico Rosario BROCKS, MD;  Location: [facility] ENDOSCOPY;  Service: Gastroenterology;  Laterality: N/A;   ERCP N/A 12/09/2018   Procedure: ENDOSCOPIC RETROGRADE CHOLANGIOPANCREATOGRAPHY (ERCP);  Surgeon: Rollin Dover, MD;  Location: [facility] ENDOSCOPY;  Service: Endoscopy;  Laterality: N/A;   ESOPHAGOGASTRODUODENOSCOPY (EGD) WITH PROPOFOL  N/A 09/24/2021   Procedure: ESOPHAGOGASTRODUODENOSCOPY (EGD) WITH PROPOFOL ;  Surgeon: Federico Rosario BROCKS, MD;  Location: [facility] ENDOSCOPY;  Service: Gastroenterology;  Laterality: N/A;   POLYPECTOMY  09/24/2021   Procedure: POLYPECTOMY;  Surgeon: Federico Rosario BROCKS, MD;  Location: [facility] ENDOSCOPY;  Service: Gastroenterology;;   ANNETT  12/09/2018   Procedure: ANNETT;  Surgeon: Rollin Dover, MD;  Location: [facility] ENDOSCOPY;  Service: Endoscopy;;   TONSILLECTOMY     TUBAL LIGATION      Review of Systems  Constitutional:   Negative for chills, fever and malaise/fatigue.  HENT:  Negative for ear pain, sore throat and tinnitus.   Eyes:  Positive for blurred vision.  Respiratory:  Negative for cough and shortness of breath.   Cardiovascular:  Negative for chest pain.  Gastrointestinal:  Negative for abdominal pain, constipation, diarrhea, nausea and vomiting.  Musculoskeletal:  Positive for myalgias (back of her neck, shoulders) and neck pain.  Neurological:  Positive for tingling (left arm) and headaches. Negative for sensory change, focal weakness and weakness.    Current Outpatient Medications  Medication Instructions   alendronate  (FOSAMAX ) 70 mg, Oral, Every 7 days, Take with a full glass of water  on an empty stomach.   atorvastatin  (LIPITOR) 40 mg, Oral, Daily   budesonide -formoterol  (SYMBICORT ) 80-4.5 MCG/ACT inhaler 2 puffs, Inhalation, 2 times daily   fexofenadine  (ALLEGRA  ALLERGY) 180 mg, Oral, Daily   fluticasone  (FLONASE ) 50 MCG/ACT nasal spray 1 spray, Each Nare, Daily   levalbuterol  (XOPENEX  HFA) 45 MCG/ACT inhaler 1 puff, Inhalation, Every 6 hours PRN   linaclotide  (LINZESS ) 145 mcg, Oral, Daily before breakfast   losartan  (COZAAR ) 100 MG tablet TAKE 1 TABLET(100 MG) BY MOUTH DAILY   methocarbamol  (ROBAXIN ) 750 mg, Oral, Every 8 hours PRN   metoprolol  succinate (TOPROL -XL) 50 mg, Oral, Daily, Take with or immediately following a meal.   montelukast  (SINGULAIR ) 10 mg, Oral, Daily at bedtime   omeprazole  (PRILOSEC) 40 mg, Oral, Daily   potassium chloride  (KLOR-CON  M) 10 MEQ tablet 10 mEq, Oral, Daily   rivaroxaban  (XARELTO ) 20 mg, Oral, Daily with supper   spironolactone  (ALDACTONE ) 50 mg, Oral, Daily, For blood pressure.   tiotropium (SPIRIVA ) 18 mcg, Inhalation, Daily   triamcinolone  cream (KENALOG ) 0.1 % Apply a pea size amount over affected areas in the lower legs.    Social History   Tobacco Use   Smoking status: Former    Current packs/day: 0.00    Types: Cigarettes    Quit date:  10/24/2015    Years since quitting: 8.3   Smokeless tobacco: Never  Vaping Use   Vaping status: Never Used  Substance Use Topics   Alcohol use: Not Currently   Drug use: Not Currently    Types: Marijuana    Comment: Smoked marijuana in the past.      Objective  Today's Vitals   03/01/24 1316 03/01/24 1352  BP: (!) 165/75 (!) 146/80  Pulse: 64 63  Temp: 97.6 F (36.4 C)   TempSrc: Oral   SpO2: 100%   Weight: 134 lb 6.4 oz (61 kg)   Height: 5' 7 (1.702 m)   PainSc: 8    PainLoc: Neck   Body mass index is 21.05 kg/m.   Physical Exam Constitutional:      General: She is not in acute distress.    Appearance: Normal appearance. She is normal weight. She is not ill-appearing or diaphoretic.  HENT:     Head: Normocephalic and atraumatic.     Mouth/Throat:     Mouth: Mucous membranes are moist.     Pharynx: Oropharynx is clear.  Eyes:     General: No scleral icterus.    Extraocular Movements: Extraocular movements intact.  Conjunctiva/sclera: Conjunctivae normal.     Pupils: Pupils are equal, round, and reactive to light.  Pulmonary:     Effort: Pulmonary effort is normal. No respiratory distress.  Musculoskeletal:        General: Tenderness (point tenderness over trapezius and muscles overlying cervical spine with associated tightness) present. No swelling, deformity or signs of injury.  Skin:    Coloration: Skin is not jaundiced or pale.     Findings: No bruising, erythema or rash.  Neurological:     Mental Status: She is alert and oriented to person, place, and time. Mental status is at baseline.     Motor: No weakness (strength 5/5 in bilateral upper extremities).     Gait: Gait normal.     Comments: Spurling test negative.  Psychiatric:        Mood and Affect: Mood normal.        Behavior: Behavior normal.          Assessment & Plan  Problem List Items Addressed This Visit       Musculoskeletal and Integument   Cervical strain - Primary   Patient's  neck pain is more consistent with a musculoskeletal process rather than neurologic. She has point tenderness over her trapezius muscle. Spurling test is negative. 5/5 strength in upper extremities. Past CT cervical spine 2024 showed chronic degenerative changes with no acute process.  -Heating pad -Supportive care with alternating 2x 500mg  Tylenol  up to 3 times per day + bengay cream OTC -Robaxin  750 mg tid prn for up to 5 days      Relevant Medications   methocarbamol  (ROBAXIN ) 750 MG tablet    Return if symptoms worsen or fail to improve.  Patient discussed with Dr. Rosan, who also saw and evaluated the patient.  Stevens Magwood, MD [facility]  PGY-1 03/01/2024, 2:10 PM

## 2024-02-29 NOTE — Progress Notes (Signed)
 Error - no visit occured

## 2024-02-29 NOTE — Telephone Encounter (Signed)
 RTC to patient has been using a creme on her neck that has helped a little.  Also has used a heating pad.  Stated would like to get muscle relaxant for.  Unable to come inn for an appointment today.  Given an appointment for tomorrow 03/01/2024 at 2:30 PM.

## 2024-03-01 ENCOUNTER — Ambulatory Visit

## 2024-03-01 VITALS — BP 146/80 | HR 63 | Temp 97.6°F | Ht 67.0 in | Wt 134.4 lb

## 2024-03-01 DIAGNOSIS — S161XXD Strain of muscle, fascia and tendon at neck level, subsequent encounter: Secondary | ICD-10-CM

## 2024-03-01 DIAGNOSIS — S161XXA Strain of muscle, fascia and tendon at neck level, initial encounter: Secondary | ICD-10-CM | POA: Insufficient documentation

## 2024-03-01 MED ORDER — METHOCARBAMOL 750 MG PO TABS
750.0000 mg | ORAL_TABLET | Freq: Three times a day (TID) | ORAL | 0 refills | Status: DC | PRN
Start: 2024-03-01 — End: 2024-05-18

## 2024-03-01 NOTE — Patient Instructions (Addendum)
 Thank you, Ms.Wealthy L Newby for allowing us  to provide your care today. Today we discussed the following:  - Your neck/head pain is likely due to a muscle strain. - You can try 2x 500mg  Tyleol up to 3x per day, Bengay Cream which are both over the counter -  We have prescribed you Methocarbamol  (Robaxin ) 750mg . Please take the first dose of this medication at night to avoid sleepiness during the day. You can take this medicine up to 3 times per day 8 hours apart.  I have ordered the following labs for you:  Lab Orders  No laboratory test(s) ordered today     Tests ordered today:  None   Referrals ordered today:   Referral Orders  No referral(s) requested today     I have ordered the following medication/changed the following medications:   Stop the following medications: There are no discontinued medications.   Start the following medications: Meds ordered this encounter  Medications   methocarbamol  (ROBAXIN ) 750 MG tablet    Sig: Take 1 tablet (750 mg total) by mouth every 8 (eight) hours as needed for muscle spasms.    Dispense:  15 tablet    Refill:  0     Follow up:    Remember: IF YOUR SYMPTOMS GET WORSE OR PERSIST AFTER 2 WEEKS PLEASE CALL THE CLINIC.  Should you have any questions or concerns please call the Internal Medicine Clinic at [phone].     Nikky Duba, MD [facility]

## 2024-03-01 NOTE — Assessment & Plan Note (Signed)
 Patient's neck pain is more consistent with a musculoskeletal process rather than neurologic. She has point tenderness over her trapezius muscle. Spurling test is negative. 5/5 strength in upper extremities. Past CT cervical spine 2024 showed chronic degenerative changes with no acute process.  -Heating pad -Supportive care with alternating 2x 500mg  Tylenol  up to 3 times per day + bengay cream OTC -Robaxin  750 mg tid prn for up to 5 days

## 2024-03-09 NOTE — Progress Notes (Signed)
Internal Medicine Clinic Attending  I was physically present during the key portions of the resident provided service and participated in the medical decision making of patient's management care. I reviewed pertinent patient test results.  The assessment, diagnosis, and plan were formulated together and I agree with the documentation in the resident's note.  Gust Rung, DO

## 2024-03-16 NOTE — Progress Notes (Signed)
 This patient is appearing on a report for being at risk of failing the adherence measure for hypertension (ACEi/ARB) medications this calendar year.   Medication: losartan  100 mg daily Last fill date: 02/07/24 for 90 day supply  Insurance report was not up to date. No action needed at this time.   Lorain Baseman, PharmD [facility] [phone]

## 2024-04-05 ENCOUNTER — Emergency Department (HOSPITAL_COMMUNITY)

## 2024-04-05 ENCOUNTER — Encounter (HOSPITAL_COMMUNITY): Payer: Self-pay | Admitting: Emergency Medicine

## 2024-04-05 ENCOUNTER — Emergency Department (HOSPITAL_COMMUNITY)
Admission: EM | Admit: 2024-04-05 | Discharge: 2024-04-05 | Disposition: A | Discharge status: Home / Self Care | Attending: Emergency Medicine | Admitting: Emergency Medicine

## 2024-04-05 ENCOUNTER — Other Ambulatory Visit: Payer: Self-pay

## 2024-04-05 DIAGNOSIS — K7689 Other specified diseases of liver: Secondary | ICD-10-CM | POA: Diagnosis not present

## 2024-04-05 DIAGNOSIS — K5900 Constipation, unspecified: Secondary | ICD-10-CM | POA: Insufficient documentation

## 2024-04-05 DIAGNOSIS — R1030 Lower abdominal pain, unspecified: Secondary | ICD-10-CM | POA: Diagnosis present

## 2024-04-05 DIAGNOSIS — N281 Cyst of kidney, acquired: Secondary | ICD-10-CM | POA: Diagnosis not present

## 2024-04-05 DIAGNOSIS — R079 Chest pain, unspecified: Secondary | ICD-10-CM | POA: Diagnosis not present

## 2024-04-05 DIAGNOSIS — Z7901 Long term (current) use of anticoagulants: Secondary | ICD-10-CM | POA: Insufficient documentation

## 2024-04-05 DIAGNOSIS — I714 Abdominal aortic aneurysm, without rupture, unspecified: Secondary | ICD-10-CM | POA: Diagnosis not present

## 2024-04-05 DIAGNOSIS — R109 Unspecified abdominal pain: Secondary | ICD-10-CM | POA: Diagnosis not present

## 2024-04-05 DIAGNOSIS — R0789 Other chest pain: Secondary | ICD-10-CM | POA: Diagnosis not present

## 2024-04-05 LAB — URINALYSIS, COMPLETE (UACMP) WITH MICROSCOPIC
Bacteria, UA: NONE SEEN
Bilirubin Urine: NEGATIVE
Glucose, UA: NEGATIVE mg/dL
Ketones, ur: NEGATIVE mg/dL
Leukocytes,Ua: NEGATIVE
Nitrite: NEGATIVE
Protein, ur: NEGATIVE mg/dL
Specific Gravity, Urine: >1.046 — ABNORMAL HIGH (ref 1.005–1.030)
pH: 5 (ref 5.0–8.0)

## 2024-04-05 LAB — BASIC METABOLIC PANEL WITH GFR
Anion gap: 7 (ref 5–15)
BUN: 8 mg/dL (ref 8–23)
CO2: 23 mmol/L (ref 22–32)
Calcium: 9.6 mg/dL (ref 8.9–10.3)
Chloride: 107 mmol/L (ref 98–111)
Creatinine, Ser: 0.78 mg/dL (ref 0.44–1.00)
GFR, Estimated: >60 mL/min (ref >60)
Glucose, Bld: 91 mg/dL (ref 70–99)
Potassium: 3.4 mmol/L — ABNORMAL LOW (ref 3.5–5.1)
Sodium: 137 mmol/L (ref 135–145)

## 2024-04-05 LAB — CBC
HCT: 37.5 % (ref 36.0–46.0)
Hemoglobin: 12.1 g/dL (ref 12.0–15.0)
MCH: 27.4 pg (ref 26.0–34.0)
MCHC: 32.3 g/dL (ref 30.0–36.0)
MCV: 85 fL (ref 80.0–100.0)
Platelets: 231 K/uL (ref 150–400)
RBC: 4.41 MIL/uL (ref 3.87–5.11)
RDW: 15.9 % — ABNORMAL HIGH (ref 11.5–15.5)
WBC: 4 K/uL (ref 4.0–10.5)
nRBC: 0 % (ref 0.0–0.2)

## 2024-04-05 LAB — HEPATIC FUNCTION PANEL
ALT: 18 U/L (ref 0–44)
AST: 21 U/L (ref 15–41)
Albumin: 3.7 g/dL (ref 3.5–5.0)
Alkaline Phosphatase: 58 U/L (ref 38–126)
Bilirubin, Direct: 0.1 mg/dL (ref 0.0–0.2)
Indirect Bilirubin: 0.4 mg/dL (ref 0.3–0.9)
Total Bilirubin: 0.5 mg/dL (ref 0.0–1.2)
Total Protein: 7.1 g/dL (ref 6.5–8.1)

## 2024-04-05 LAB — TROPONIN I (HIGH SENSITIVITY)
Troponin I (High Sensitivity): 4 ng/L (ref <18)
Troponin I (High Sensitivity): 6 ng/L (ref <18)

## 2024-04-05 LAB — LIPASE, BLOOD: Lipase: 31 U/L (ref 11–51)

## 2024-04-05 MED ORDER — IOHEXOL 350 MG/ML SOLN
75.0000 mL | Freq: Once | INTRAVENOUS | Status: AC | PRN
  Administered 2024-04-05: 75 mL via INTRAVENOUS

## 2024-04-05 MED ORDER — POLYETHYLENE GLYCOL 3350 17 G PO PACK
PACK | ORAL | 0 refills | Status: AC
Start: 2024-04-05 — End: ?

## 2024-04-05 NOTE — Discharge Instructions (Addendum)
 Continue Linzess .  Take a dose of miralax  tomorrow morning and then repeat tomorrow afternoon,  if you have a good bowel movement you can just take as needed,  If you do not have a good bowel movement repeat the morning and evening dose again on Saturday

## 2024-04-05 NOTE — ED Provider Notes (Signed)
 [place] EMERGENCY DEPARTMENT AT [facility] Provider Note   CSN: [id number] Arrival date & time: 04/05/24  9262     Patient presents with: No chief complaint on file.   [name] is a 73 y.o. female.   Patient complains of pain in her chest.  Patient reports that pain in her chest began today.  Patient reports that she also has lower abdominal pain.  Patient reports that she has a history of chronic constipation and has been taking Linzess .  Patient states that she feels more swollen and has more discomfort than usual.  Patient states that she feels like she may have been having chest pain because she is upset about the chronic constipation.  Patient reports all the pain in her chest has resolved but she is still having abdominal pain.  Patient reports she has had nausea she has not had any vomiting.  Patient denies fever she denies any chills she denies any cough or congestion.Patient reports that she has a history of atrial fibrillation.  She is currently on Xarelto .  The history is provided by the patient. No language interpreter was used.       Prior to Admission medications   Medication Sig Start Date End Date Taking? Authorizing Provider  alendronate  (FOSAMAX ) 70 MG tablet Take 1 tablet (70 mg total) by mouth every 7 (seven) days. Take with a full glass of water  on an empty stomach. 12/27/23 12/26/24  Trudy Mliss Dragon, MD  atorvastatin  (LIPITOR) 40 MG tablet Take 1 tablet (40 mg total) by mouth daily. 06/04/23 06/03/24  Trudy Mliss Dragon, MD  budesonide -formoterol  (SYMBICORT ) 80-4.5 MCG/ACT inhaler Inhale 2 puffs into the lungs 2 (two) times daily. 01/03/24   Alexander-Savino, [place], MD  fexofenadine  (ALLEGRA  ALLERGY) 180 MG tablet Take 1 tablet (180 mg total) by mouth daily. 09/23/23   Kandis Perkins, DO  fluticasone  (FLONASE ) 50 MCG/ACT nasal spray Place 1 spray into both nostrils daily. 10/04/22 10/04/23  Nooruddin, Saad, MD  levalbuterol  (XOPENEX  HFA) 45  MCG/ACT inhaler Inhale 1 puff into the lungs every 6 (six) hours as needed for shortness of breath. 11/11/23   Trudy Mliss Dragon, MD  linaclotide  (LINZESS ) 145 MCG CAPS capsule Take 1 capsule (145 mcg total) by mouth daily before breakfast. 01/31/23 01/26/24  Trudy Mliss Dragon, MD  losartan  (COZAAR ) 100 MG tablet TAKE 1 TABLET(100 MG) BY MOUTH DAILY 01/24/24   Trudy Mliss Dragon, MD  methocarbamol  (ROBAXIN ) 750 MG tablet Take 1 tablet (750 mg total) by mouth every 8 (eight) hours as needed for muscle spasms. 03/01/24   Rosan Dayton BROCKS, DO  metoprolol  succinate (TOPROL -XL) 50 MG 24 hr tablet Take 1 tablet (50 mg total) by mouth daily. Take with or immediately following a meal. 01/09/24   Trudy Mliss Dragon, MD  montelukast  (SINGULAIR ) 10 MG tablet Take 1 tablet (10 mg total) by mouth at bedtime. 01/11/24   Trudy Mliss Dragon, MD  omeprazole  (PRILOSEC) 40 MG capsule Take 1 capsule (40 mg total) by mouth daily. 02/23/24   Trudy Mliss Dragon, MD  potassium chloride  (KLOR-CON  M) 10 MEQ tablet Take 1 tablet (10 mEq total) by mouth daily. 01/03/24   Trudy Mliss Dragon, MD  rivaroxaban  (XARELTO ) 20 MG TABS tablet Take 1 tablet (20 mg total) by mouth daily with supper. 01/18/23   Terra Fairy PARAS, PA-C  spironolactone  (ALDACTONE ) 25 MG tablet Take 2 tablets (50 mg total) by mouth daily. For blood pressure. 11/23/23 11/22/24  Trudy Mliss Dragon, MD  tiotropium (SPIRIVA )  18 MCG inhalation capsule Place 1 capsule (18 mcg total) into inhaler and inhale daily. 01/19/23   Trudy Mliss Dragon, MD  triamcinolone  cream (KENALOG ) 0.1 % Apply a pea size amount over affected areas in the lower legs. 01/10/24   Alexander-Savino, [place], MD    Allergies: Albuterol , Percocet [oxycodone -acetaminophen ], Augmentin  [amoxicillin -pot clavulanate], Celecoxib, and Tramadol     Review of Systems  All other systems reviewed and are negative.   Updated Vital Signs BP 120/81 (BP Location: Right Arm)   Pulse 62   Temp 97.8 F  (36.6 C) (Oral)   Resp 13   Ht 5' 7 (1.702 m)   Wt 61.7 kg   SpO2 100%   BMI 21.30 kg/m   Physical Exam Vitals and nursing note reviewed.  Constitutional:      Appearance: She is well-developed.  HENT:     Head: Normocephalic.  Cardiovascular:     Rate and Rhythm: Normal rate.     Heart sounds: Normal heart sounds.  Pulmonary:     Effort: Pulmonary effort is normal.     Breath sounds: Normal breath sounds.  Abdominal:     General: There is no distension.     Palpations: Abdomen is soft.  Musculoskeletal:        General: Normal range of motion.     Cervical back: Normal range of motion.  Skin:    General: Skin is warm.  Neurological:     General: No focal deficit present.     Mental Status: She is alert and oriented to person, place, and time.     (all labs ordered are listed, but only abnormal results are displayed) Labs Reviewed  BASIC METABOLIC PANEL WITH GFR - Abnormal; Notable for the following components:      Result Value   Potassium 3.4 (*)    All other components within normal limits  CBC - Abnormal; Notable for the following components:   RDW 15.9 (*)    All other components within normal limits  HEPATIC FUNCTION PANEL  LIPASE, BLOOD  URINALYSIS, COMPLETE (UACMP) WITH MICROSCOPIC  TROPONIN I (HIGH SENSITIVITY)  TROPONIN I (HIGH SENSITIVITY)    EKG: None  Radiology: DG Chest 2 View Result Date: 04/05/2024 CLINICAL DATA:  Chest pain. EXAM: CHEST - 2 VIEW COMPARISON:  09/16/2023. FINDINGS: Bilateral lung fields are clear. Bilateral costophrenic angles are clear. Normal cardio-mediastinal silhouette. No acute osseous abnormalities. The soft tissues are within normal limits. There are surgical clips in the right upper quadrant, typical of a previous cholecystectomy. IMPRESSION: No active cardiopulmonary disease. Electronically Signed   By: Ree Molt M.D.   On: 04/05/2024 08:45     Procedures   Medications Ordered in the ED  iohexol  (OMNIPAQUE )  350 MG/ML injection 75 mL (75 mLs Intravenous Contrast Given 04/05/24 1740)                                    Medical Decision Making Patient complains of chest pain earlier today.  Patient reports chest pain has resolved.  Patient complains of having abdominal pain.  Patient states she has chronic constipation but she does normally not have pain like this.  Amount and/or Complexity of Data Reviewed External Data Reviewed: notes.    Details: Previous notes from cardiology reviewed Labs: ordered. Decision-making details documented in ED Course.    Details: Labs ordered reviewed and interpreted troponin is negative x 2 Radiology: ordered and independent  interpretation performed. Decision-making details documented in ED Course.    Details: CT abdomen shows no acute findings.  Patient has a 3 cm abdominal aortic aneurysm. ECG/medicine tests: ordered and independent interpretation performed. Decision-making details documented in ED Course.    Details: EKG shows normal sinus rhythm normal EKG  Risk OTC drugs. Prescription drug management. Risk Details: CT abdomen no acute findings.  Patient has a 3 patient is counseled on results of his CT.  Patient is advised of aneurysm.  She is advised to follow-up with her primary care physician.  Patient counseled on constipation she is advised to try MiraLAX  along with her Linzess  to see if she can get some relief.        Final diagnoses:  Constipation, unspecified constipation type  Nonspecific chest pain    ED Discharge Orders     None          Flint Sonny POUR, [place] 04/05/24 2252    Bari Roxie HERO, DO 04/05/24 2349

## 2024-04-05 NOTE — ED Notes (Signed)
 IV team bedside.

## 2024-04-05 NOTE — ED Triage Notes (Signed)
 Pt had CP since yesterday. Felt like it was fluttering a little harder this AM. Pt has had some family stress. Pt states she has hx of afib. CP is 8/10 and has some dizziness

## 2024-04-11 ENCOUNTER — Other Ambulatory Visit (HOSPITAL_COMMUNITY): Payer: Self-pay | Admitting: Internal Medicine

## 2024-04-20 ENCOUNTER — Ambulatory Visit (INDEPENDENT_AMBULATORY_CARE_PROVIDER_SITE_OTHER): Admitting: Student

## 2024-04-20 VITALS — BP 142/85 | HR 58 | Temp 97.6°F | Ht 67.0 in | Wt 130.6 lb

## 2024-04-20 DIAGNOSIS — J302 Other seasonal allergic rhinitis: Secondary | ICD-10-CM | POA: Diagnosis not present

## 2024-04-20 DIAGNOSIS — J4520 Mild intermittent asthma, uncomplicated: Secondary | ICD-10-CM

## 2024-04-20 DIAGNOSIS — I714 Abdominal aortic aneurysm, without rupture, unspecified: Secondary | ICD-10-CM | POA: Diagnosis not present

## 2024-04-20 DIAGNOSIS — R109 Unspecified abdominal pain: Secondary | ICD-10-CM | POA: Diagnosis not present

## 2024-04-20 DIAGNOSIS — J3089 Other allergic rhinitis: Secondary | ICD-10-CM

## 2024-04-20 DIAGNOSIS — I10 Essential (primary) hypertension: Secondary | ICD-10-CM

## 2024-04-20 DIAGNOSIS — G8929 Other chronic pain: Secondary | ICD-10-CM

## 2024-04-20 DIAGNOSIS — K5900 Constipation, unspecified: Secondary | ICD-10-CM

## 2024-04-20 DIAGNOSIS — K5909 Other constipation: Secondary | ICD-10-CM

## 2024-04-20 MED ORDER — TIOTROPIUM BROMIDE MONOHYDRATE 18 MCG IN CAPS: 18.0000 ug | ORAL_CAPSULE | Freq: Every day | RESPIRATORY_TRACT | 3 refills | Status: AC

## 2024-04-20 MED ORDER — LEVALBUTEROL TARTRATE 45 MCG/ACT IN AERO: 1.0000 | INHALATION_SPRAY | Freq: Four times a day (QID) | RESPIRATORY_TRACT | 5 refills | Status: AC | PRN

## 2024-04-20 MED ORDER — CETIRIZINE HCL 10 MG PO TABS: 10.0000 mg | ORAL_TABLET | Freq: Every day | ORAL | 11 refills | Status: AC

## 2024-04-20 MED ORDER — BUDESONIDE-FORMOTEROL FUMARATE 80-4.5 MCG/ACT IN AERO: 2.0000 | INHALATION_SPRAY | Freq: Two times a day (BID) | RESPIRATORY_TRACT | 12 refills | Status: AC

## 2024-04-20 MED ORDER — FLUTICASONE PROPIONATE 50 MCG/ACT NA SUSP: 1.0000 | Freq: Every day | NASAL | 2 refills | Status: AC

## 2024-04-20 MED ORDER — LINACLOTIDE 145 MCG PO CAPS: 145.0000 ug | ORAL_CAPSULE | Freq: Every day | ORAL | 11 refills | Status: AC

## 2024-04-20 NOTE — Patient Instructions (Addendum)
 Thank you, Ms.Paloma L Maske for allowing us  to provide your care today. Today we discussed   Your constipation - I resent the Linzess  medication  Sinus- it seems you were out of many inhalers and medications. I have refilled them as well  Continue taking your blood pressure medications  Your physical exam today was normal; continue taking good care of yourself!     I will call if any are abnormal. All of your labs can be accessed through My Chart.   My Chart Access: [email or web address]  Please follow-up in: 3 months    We look forward to seeing you next time. Please call our clinic at [phone] if you have any questions or concerns. The best time to call is Monday-Friday from 9am-4pm, but there is someone available 24/7. If after hours or the weekend, call the main hospital number and ask for the Internal Medicine Resident On-Call. If you need medication refills, please notify your pharmacy one week in advance and they will send us  a request.   Thank you for letting us  take part in your care. Wishing you the best!  Elnora Ip, MD 04/20/2024, 10:04 AM [facility]

## 2024-04-20 NOTE — Progress Notes (Signed)
 Subjective:  CC: rhinitis follow up  HPI:  [name] is a 73 y.o. female with a past medical history stated below and presents today for rhinitis. Please see problem based assessment and plan for additional details.  Past Medical History:  Diagnosis Date   Asthma in adult, mild intermittent, uncomplicated    Chronic anticoagulation    Chronic constipation    COVID-19 virus infection 10/19/2021   Eczema    GERD (gastroesophageal reflux disease)    H. pylori infection 09/24/2021   Identified on EGD biopsy.  Treatment will be discussed with her GI doctor.  SHe will need a very simple regimen which has no contraindications in combination with her existing medication regimen.     Hemoperitoneum 09/27/2021   Due to splenic lac occurring after colonoscopy on 09/24/21.  Xarelto  resumed.    History of kidney stones    Hx of vaginal bleeding, resolved, ultrasound completed 10/11/2018   Hypertension    Laceration of spleen    Presumed complication of colonoscopy 09/24/21.  Pain and hemoperitoneum.  Xarelto  resumed.  Short term opioid pain management.     Personal history of atrial fibrillation, s/p ablation, now in NSR but remains on anticoaulation 11/26/2015   Followed in cardiology clinic     Current Outpatient Medications on File Prior to Visit  Medication Sig Dispense Refill   alendronate  (FOSAMAX ) 70 MG tablet Take 1 tablet (70 mg total) by mouth every 7 (seven) days. Take with a full glass of water  on an empty stomach. 12 tablet 3   atorvastatin  (LIPITOR) 40 MG tablet Take 1 tablet (40 mg total) by mouth daily. 90 tablet 3   losartan  (COZAAR ) 100 MG tablet TAKE 1 TABLET(100 MG) BY MOUTH DAILY 90 tablet 3   methocarbamol  (ROBAXIN ) 750 MG tablet Take 1 tablet (750 mg total) by mouth every 8 (eight) hours as needed for muscle spasms. 15 tablet 0   metoprolol  succinate (TOPROL -XL) 50 MG 24 hr tablet Take 1 tablet (50 mg total) by mouth daily. Take with or immediately following a meal. 30  tablet 5   montelukast  (SINGULAIR ) 10 MG tablet Take 1 tablet (10 mg total) by mouth at bedtime. 90 tablet 3   omeprazole  (PRILOSEC) 40 MG capsule Take 1 capsule (40 mg total) by mouth daily. 90 capsule 3   polyethylene glycol (MIRALAX ) 17 g packet One dose twice a day for 2 days then once a day as needed. 14 each 0   potassium chloride  (KLOR-CON  M) 10 MEQ tablet Take 1 tablet (10 mEq total) by mouth daily. 90 tablet 0   spironolactone  (ALDACTONE ) 25 MG tablet Take 2 tablets (50 mg total) by mouth daily. For blood pressure. 60 tablet 11   triamcinolone  cream (KENALOG ) 0.1 % Apply a pea size amount over affected areas in the lower legs. (Patient taking differently: Apply 1 Application topically daily as needed (for affected ares lower legs).) 30 g 1   XARELTO  20 MG TABS tablet TAKE 1 TABLET(20 MG) BY MOUTH DAILY WITH SUPPER 30 tablet 11   No current facility-administered medications on file prior to visit.    Family History  Problem Relation Age of Onset   Heart attack Mother        Young age   Breast cancer Sister        Late 2s; now s/p mastectomy    Lung cancer Brother        x 2   Cancer Sister    Cancer Sister  Cancer Sister    Colon cancer Neg Hx    Colon polyps Neg Hx    Esophageal cancer Neg Hx    Rectal cancer Neg Hx    Stomach cancer Neg Hx     Social History   Socioeconomic History   Marital status: Single    Spouse name: Not on file   Number of children: Not on file   Years of education: Not on file   Highest education level: Not on file  Occupational History   Occupation: Retired  Tobacco Use   Smoking status: Former    Current packs/day: 0.00    Types: Cigarettes    Quit date: 10/24/2015    Years since quitting: 8.5   Smokeless tobacco: Never  Vaping Use   Vaping status: Never Used  Substance and Sexual Activity   Alcohol use: Not Currently   Drug use: Not Currently    Types: Marijuana    Comment: Smoked marijuana in the past.   Sexual activity: Yes     Partners: Male    Birth control/protection: Post-menopausal  Other Topics Concern   Not on file  Social History Narrative   Lives alone in [place], [place] and has 2 daughters who are within driving distance   Enjoys watching soap operas, reading, exercise   Social Drivers of Health   Financial Resource Strain: Low Risk  (01/04/2024)   Overall Financial Resource Strain (CARDIA)    Difficulty of Paying Living Expenses: Not hard at all  Food Insecurity: No Food Insecurity (01/04/2024)   Hunger Vital Sign    Worried About Running Out of Food in the Last Year: Never true    Ran Out of Food in the Last Year: Never true  Transportation Needs: No Transportation Needs (01/04/2024)   PRAPARE - Administrator, Civil Service (Medical): No    Lack of Transportation (Non-Medical): No  Physical Activity: Insufficiently Active (01/04/2024)   Exercise Vital Sign    Days of Exercise per Week: 2 days    Minutes of Exercise per Session: 30 min  Stress: No Stress Concern Present (01/04/2024)   [employer] of Occupational Health - Occupational Stress Questionnaire    Feeling of Stress : Not at all  Social Connections: Socially Isolated (01/04/2024)   Social Connection and Isolation Panel    Frequency of Communication with Friends and Family: More than three times a week    Frequency of Social Gatherings with Friends and Family: Three times a week    Attends Religious Services: Never    Active Member of Clubs or Organizations: No    Attends Banker Meetings: Never    Marital Status: Separated  Intimate Partner Violence: Not At Risk (01/04/2024)   Humiliation, Afraid, Rape, and Kick questionnaire    Fear of Current or Ex-Partner: No    Emotionally Abused: No    Physically Abused: No    Sexually Abused: No    Review of Systems: ROS negative except for what is noted on the assessment and plan.  Objective:   Vitals:   04/20/24 0917 04/20/24 1007  BP: (!) 149/68 (!)  142/85  Pulse: (!) 58 (!) 58  Temp: 97.6 F (36.4 C)   TempSrc: Oral   SpO2: 100%   Weight: 130 lb 9.6 oz (59.2 kg)   Height: 5' 7 (1.702 m)     Physical Exam: Constitutional: well-appearing elderly woman sitting in chair, in no acute distress HENT: normocephalic atraumatic, mucous membranes moist. Nonerythematous nasal nares.  TM visualized without effusion. Clear posterior oropharynx. Eyes: conjunctiva non-erythematous Neck: cervical LAD. Cardiovascular: regular rate and rhythm, no m/r/g Pulmonary/Chest: normal work of breathing on room air, lungs clear to auscultation bilaterally Abdominal: soft, non-tender, non-distended MSK: normal bulk and tone Neurological: alert & oriented x 3, normal gait Skin: warm and dry Psych: normal mood and affect    Assessment & Plan:   Hypertension Uncontrolled today. Did not take her medications until about 5 min before this appointment.   - Will continue on spironolactone  50 mg daily, metoprolol  succinate 50 mg daily, losartan  100 mg daily   Seasonal and perennial allergic (presumed) rhinitis Chronic condition with associated daily distress. 12 month-course of prescription medications needs renewal.  - Refilled medications today  Asthma in adult, mild intermittent, uncomplicated Not currently with overt wheezing or signs of exacerbation.  - Refilled medications today  Chronic recurrent  abdominal pain associated with bowel irregularity and gas; suspected IBS No abdominal bloating but continues to have constipation partially relieved with Linzess . Requested refill today.    Return in about 3 months (around 07/21/2024) for Chronic condition follow up.  Patient discussed with Dr. Trudy Ip Kristy Ahr, MD [facility]  04/23/2024, 6:12 PM

## 2024-04-23 DIAGNOSIS — I714 Abdominal aortic aneurysm, without rupture, unspecified: Secondary | ICD-10-CM | POA: Insufficient documentation

## 2024-04-23 NOTE — Assessment & Plan Note (Signed)
 Not currently with overt wheezing or signs of exacerbation.  - Refilled medications today

## 2024-04-23 NOTE — Assessment & Plan Note (Signed)
 No abdominal bloating but continues to have constipation partially relieved with Linzess . Requested refill today.

## 2024-04-23 NOTE — Assessment & Plan Note (Signed)
 Chronic condition with associated daily distress. 12 month-course of prescription medications needs renewal.  - Refilled medications today

## 2024-04-23 NOTE — Assessment & Plan Note (Signed)
 Uncontrolled today. Did not take her medications until about 5 min before this appointment.   - Will continue on spironolactone  50 mg daily, metoprolol  succinate 50 mg daily, losartan  100 mg daily

## 2024-04-27 NOTE — Progress Notes (Signed)
 Internal Medicine Clinic Attending  Case discussed with the resident at the time of the visit.  We reviewed the resident's history and exam and pertinent patient test results.  I agree with the assessment, diagnosis, and plan of care documented in the resident's note.

## 2024-05-02 ENCOUNTER — Emergency Department (HOSPITAL_COMMUNITY)
Admission: EM | Admit: 2024-05-02 | Discharge: 2024-05-03 | Discharge status: Against Medical Advice | Attending: Student in an Organized Health Care Education/Training Program | Admitting: Student in an Organized Health Care Education/Training Program

## 2024-05-02 ENCOUNTER — Emergency Department (HOSPITAL_COMMUNITY)

## 2024-05-02 ENCOUNTER — Telehealth: Payer: Self-pay | Admitting: *Deleted

## 2024-05-02 ENCOUNTER — Other Ambulatory Visit: Payer: Self-pay

## 2024-05-02 DIAGNOSIS — R0989 Other specified symptoms and signs involving the circulatory and respiratory systems: Secondary | ICD-10-CM | POA: Diagnosis not present

## 2024-05-02 DIAGNOSIS — R0981 Nasal congestion: Secondary | ICD-10-CM | POA: Insufficient documentation

## 2024-05-02 DIAGNOSIS — R079 Chest pain, unspecified: Secondary | ICD-10-CM | POA: Insufficient documentation

## 2024-05-02 DIAGNOSIS — U071 COVID-19: Secondary | ICD-10-CM | POA: Diagnosis not present

## 2024-05-02 DIAGNOSIS — Z5321 Procedure and treatment not carried out due to patient leaving prior to being seen by health care provider: Secondary | ICD-10-CM | POA: Diagnosis not present

## 2024-05-02 DIAGNOSIS — R42 Dizziness and giddiness: Secondary | ICD-10-CM | POA: Insufficient documentation

## 2024-05-02 DIAGNOSIS — I482 Chronic atrial fibrillation, unspecified: Secondary | ICD-10-CM | POA: Diagnosis not present

## 2024-05-02 DIAGNOSIS — D72819 Decreased white blood cell count, unspecified: Secondary | ICD-10-CM | POA: Diagnosis not present

## 2024-05-02 DIAGNOSIS — R0602 Shortness of breath: Secondary | ICD-10-CM | POA: Diagnosis not present

## 2024-05-02 DIAGNOSIS — Z79899 Other long term (current) drug therapy: Secondary | ICD-10-CM | POA: Diagnosis not present

## 2024-05-02 DIAGNOSIS — I10 Essential (primary) hypertension: Secondary | ICD-10-CM | POA: Diagnosis not present

## 2024-05-02 DIAGNOSIS — R059 Cough, unspecified: Secondary | ICD-10-CM | POA: Diagnosis present

## 2024-05-02 DIAGNOSIS — Z7901 Long term (current) use of anticoagulants: Secondary | ICD-10-CM | POA: Diagnosis not present

## 2024-05-02 DIAGNOSIS — J45909 Unspecified asthma, uncomplicated: Secondary | ICD-10-CM | POA: Diagnosis not present

## 2024-05-02 LAB — CBC
HCT: 38 % (ref 36.0–46.0)
Hemoglobin: 12.1 g/dL (ref 12.0–15.0)
MCH: 27.3 pg (ref 26.0–34.0)
MCHC: 31.8 g/dL (ref 30.0–36.0)
MCV: 85.6 fL (ref 80.0–100.0)
Platelets: 218 K/uL (ref 150–400)
RBC: 4.44 MIL/uL (ref 3.87–5.11)
RDW: 15.6 % — ABNORMAL HIGH (ref 11.5–15.5)
WBC: 4.3 K/uL (ref 4.0–10.5)
nRBC: 0 % (ref 0.0–0.2)

## 2024-05-02 LAB — TROPONIN I (HIGH SENSITIVITY)
Troponin I (High Sensitivity): 4 ng/L (ref <18)
Troponin I (High Sensitivity): 5 ng/L (ref <18)

## 2024-05-02 LAB — BASIC METABOLIC PANEL WITH GFR
Anion gap: 12 (ref 5–15)
BUN: 11 mg/dL (ref 8–23)
CO2: 20 mmol/L — ABNORMAL LOW (ref 22–32)
Calcium: 9.2 mg/dL (ref 8.9–10.3)
Chloride: 105 mmol/L (ref 98–111)
Creatinine, Ser: 0.86 mg/dL (ref 0.44–1.00)
GFR, Estimated: >60 mL/min (ref >60)
Glucose, Bld: 111 mg/dL — ABNORMAL HIGH (ref 70–99)
Potassium: 3.6 mmol/L (ref 3.5–5.1)
Sodium: 137 mmol/L (ref 135–145)

## 2024-05-02 LAB — RESP PANEL BY RT-PCR (RSV, FLU A&B, COVID)  RVPGX2
Influenza A by PCR: NEGATIVE
Influenza B by PCR: NEGATIVE
Resp Syncytial Virus by PCR: NEGATIVE
SARS Coronavirus 2 by RT PCR: POSITIVE — AB

## 2024-05-02 NOTE — Telephone Encounter (Signed)
 Attn:  Burnett Kenneth FALCON, RN   Pt [name] is calling to ask will you be returning her call and did you find whatever you were looking for for her (she did not know what)  Please advise.

## 2024-05-02 NOTE — Telephone Encounter (Signed)
 Call to patient to inform her to try to OTC medications .  Patient stated she is in the ER now and will wait until 3 PM to see if they will order something .  If not will go home and ask Dr. Trudy to call her in the medication for the Covid.  She feels that she needs it rather than trying the OTC meds .

## 2024-05-02 NOTE — ED Notes (Signed)
Pt called for room x3 no answer 

## 2024-05-02 NOTE — Telephone Encounter (Signed)
 RTC to patient states has left the ER as she has been there since 7:30 this morning.  Is feeling bad.  Week and some Diarrhea.  Wants to know if Dr. Trudy can please give her a call.

## 2024-05-02 NOTE — Telephone Encounter (Signed)
 Call fro patient.  Tested positive for Covid Today in the ER.  States she has  back aches and runny nose.  Just feels bad.  Wants to know if she needs to have something called in.

## 2024-05-02 NOTE — ED Triage Notes (Signed)
 Pt. Stated, [name] had some chest pain and a sinus infection, dizziness, a little chest congestion also. This started 3 days ago.

## 2024-05-02 NOTE — ED Provider Triage Note (Signed)
 Emergency Medicine Provider Triage Evaluation Note  [name] , a 73 y.o. female  was evaluated in triage.  Pt complains of multiple complaints.  She has been feeling unwell for several days, reports feeling congested and a chest cold that she describes as constant pain in her chest, also reports feeling more fatigued today and had a fever at home.  Review of Systems  Positive: As above Negative: As above  Physical Exam  BP 129/71   Pulse 79   Temp 99 F (37.2 C) (Oral)   Resp 16   SpO2 96%  Gen:   Awake, no distress   Resp:  Normal effort  MSK:   Moves extremities without difficulty  Other:    Medical Decision Making  Medically screening exam initiated at 12:29 PM.  Appropriate orders placed.  Lynder LITTIE Bloodgood was informed that the remainder of the evaluation will be completed by another provider, this initial triage assessment does not replace that evaluation, and the importance of remaining in the ED until their evaluation is complete.     Glendia Rocky SAILOR, [place] 05/02/24 1230

## 2024-05-02 NOTE — ED Notes (Signed)
 Pt has not responded to call outs to have vitals taken over the course of 2 hours.

## 2024-05-03 ENCOUNTER — Telehealth: Payer: Self-pay

## 2024-05-03 ENCOUNTER — Emergency Department (HOSPITAL_COMMUNITY)
Admission: EM | Admit: 2024-05-03 | Discharge: 2024-05-03 | Disposition: A | Discharge status: Home / Self Care | Source: Home / Self Care | Attending: Emergency Medicine | Admitting: Emergency Medicine

## 2024-05-03 ENCOUNTER — Emergency Department (HOSPITAL_COMMUNITY)

## 2024-05-03 ENCOUNTER — Telehealth: Payer: Self-pay | Admitting: *Deleted

## 2024-05-03 ENCOUNTER — Ambulatory Visit: Payer: Self-pay

## 2024-05-03 DIAGNOSIS — Z7901 Long term (current) use of anticoagulants: Secondary | ICD-10-CM | POA: Insufficient documentation

## 2024-05-03 DIAGNOSIS — R0689 Other abnormalities of breathing: Secondary | ICD-10-CM | POA: Diagnosis not present

## 2024-05-03 DIAGNOSIS — I499 Cardiac arrhythmia, unspecified: Secondary | ICD-10-CM | POA: Diagnosis not present

## 2024-05-03 DIAGNOSIS — Z79899 Other long term (current) drug therapy: Secondary | ICD-10-CM | POA: Insufficient documentation

## 2024-05-03 DIAGNOSIS — D72819 Decreased white blood cell count, unspecified: Secondary | ICD-10-CM | POA: Insufficient documentation

## 2024-05-03 DIAGNOSIS — I10 Essential (primary) hypertension: Secondary | ICD-10-CM | POA: Insufficient documentation

## 2024-05-03 DIAGNOSIS — R0602 Shortness of breath: Secondary | ICD-10-CM | POA: Diagnosis not present

## 2024-05-03 DIAGNOSIS — R404 Transient alteration of awareness: Secondary | ICD-10-CM | POA: Diagnosis not present

## 2024-05-03 DIAGNOSIS — J45909 Unspecified asthma, uncomplicated: Secondary | ICD-10-CM | POA: Insufficient documentation

## 2024-05-03 DIAGNOSIS — I482 Chronic atrial fibrillation, unspecified: Secondary | ICD-10-CM | POA: Insufficient documentation

## 2024-05-03 DIAGNOSIS — U071 COVID-19: Secondary | ICD-10-CM | POA: Insufficient documentation

## 2024-05-03 DIAGNOSIS — R079 Chest pain, unspecified: Secondary | ICD-10-CM | POA: Diagnosis not present

## 2024-05-03 DIAGNOSIS — R231 Pallor: Secondary | ICD-10-CM | POA: Diagnosis not present

## 2024-05-03 LAB — CBC WITH DIFFERENTIAL/PLATELET
Abs Immature Granulocytes: 0 K/uL (ref 0.00–0.07)
Basophils Absolute: 0 K/uL (ref 0.0–0.1)
Basophils Relative: 0 %
Eosinophils Absolute: 0.1 K/uL (ref 0.0–0.5)
Eosinophils Relative: 3 %
HCT: 35.2 % — ABNORMAL LOW (ref 36.0–46.0)
Hemoglobin: 11.1 g/dL — ABNORMAL LOW (ref 12.0–15.0)
Immature Granulocytes: 0 %
Lymphocytes Relative: 38 %
Lymphs Abs: 1.3 K/uL (ref 0.7–4.0)
MCH: 26.7 pg (ref 26.0–34.0)
MCHC: 31.5 g/dL (ref 30.0–36.0)
MCV: 84.6 fL (ref 80.0–100.0)
Monocytes Absolute: 0.6 K/uL (ref 0.1–1.0)
Monocytes Relative: 18 %
Neutro Abs: 1.3 K/uL — ABNORMAL LOW (ref 1.7–7.7)
Neutrophils Relative %: 41 %
Platelets: 206 K/uL (ref 150–400)
RBC: 4.16 MIL/uL (ref 3.87–5.11)
RDW: 15.5 % (ref 11.5–15.5)
WBC: 3.3 K/uL — ABNORMAL LOW (ref 4.0–10.5)
nRBC: 0 % (ref 0.0–0.2)

## 2024-05-03 LAB — BASIC METABOLIC PANEL WITH GFR
Anion gap: 12 (ref 5–15)
BUN: 9 mg/dL (ref 8–23)
CO2: 21 mmol/L — ABNORMAL LOW (ref 22–32)
Calcium: 9.3 mg/dL (ref 8.9–10.3)
Chloride: 107 mmol/L (ref 98–111)
Creatinine, Ser: 0.72 mg/dL (ref 0.44–1.00)
GFR, Estimated: >60 mL/min (ref >60)
Glucose, Bld: 100 mg/dL — ABNORMAL HIGH (ref 70–99)
Potassium: 3.5 mmol/L (ref 3.5–5.1)
Sodium: 140 mmol/L (ref 135–145)

## 2024-05-03 MED ORDER — ACETAMINOPHEN 325 MG PO TABS: 650.0000 mg | ORAL_TABLET | Freq: Four times a day (QID) | ORAL | 0 refills | Status: AC | PRN

## 2024-05-03 MED ORDER — BENZONATATE 100 MG PO CAPS
200.0000 mg | ORAL_CAPSULE | Freq: Once | ORAL | Status: AC
  Administered 2024-05-03: 200 mg via ORAL
  Filled 2024-05-03: qty 2

## 2024-05-03 MED ORDER — OXYMETAZOLINE HCL 0.05 % NA SOLN: 1.0000 | Freq: Two times a day (BID) | NASAL | 0 refills | Status: AC

## 2024-05-03 MED ORDER — SODIUM CHLORIDE 0.9 % IV BOLUS
1000.0000 mL | Freq: Once | INTRAVENOUS | Status: AC
  Administered 2024-05-03: 1000 mL via INTRAVENOUS

## 2024-05-03 MED ORDER — BENZONATATE 100 MG PO CAPS: 100.0000 mg | ORAL_CAPSULE | Freq: Three times a day (TID) | ORAL | 0 refills | Status: AC

## 2024-05-03 MED ORDER — GUAIFENESIN-DM 100-10 MG/5ML PO SYRP: 5.0000 mL | ORAL_SOLUTION | ORAL | 0 refills | Status: AC | PRN

## 2024-05-03 MED ORDER — IBUPROFEN 600 MG PO TABS: 600.0000 mg | ORAL_TABLET | Freq: Four times a day (QID) | ORAL | 0 refills | Status: AC | PRN

## 2024-05-03 MED ORDER — ACETAMINOPHEN 325 MG PO TABS
650.0000 mg | ORAL_TABLET | Freq: Once | ORAL | Status: AC
  Administered 2024-05-03: 650 mg via ORAL
  Filled 2024-05-03: qty 2

## 2024-05-03 NOTE — Discharge Instructions (Addendum)
 You have been seen in the Emergency Department (ED) today for a likely viral illness secondary to COVID19.  Please drink plenty of clear fluids (water , Gatorade, chicken broth, etc).  You may use Tylenol  and/or Motrin  according to label instructions.  You can alternate between the two without any side effects.   Please follow up with your doctor as listed above.  Call your doctor or return to the Emergency Department (ED) if you are unable to tolerate fluids due to vomiting, have worsening trouble breathing, become extremely tired or difficult to awaken, or if you develop any other symptoms that concern you.

## 2024-05-03 NOTE — Telephone Encounter (Signed)
 FYI Only or Action Required?: Action required by provider: Medication Request.  Patient was last seen in primary care on 04/20/2024 by Elnora Ip, MD.  Called Nurse Triage reporting Covid Positive.  Symptoms began several days ago.  Interventions attempted: OTC medications: Tylenol .  Symptoms are: unchanged.  Triage Disposition: Information or Advice Only Call  Patient/caregiver understands and will follow disposition?: Yes   Reason for Disposition  Health information question, no triage required and triager able to answer question  Answer Assessment - Initial Assessment Questions Patient calling in for a medication for Covid because the medication the ER gave her, her insurance will not cover. Denies setting up hospital f/u at this time. Denies new symptoms, just calling in for a medication to be sent over to Pharmacy: [facility] on file.  1. REASON FOR CALL: What is the main reason for your call? or How can I best help you?     Needs PCP to call in medication for Covid  2. SYMPTOMS : Do you have any symptoms?      Feeling really bad, coughing  Protocols used: Information Only Call - No Triage-A-AH  Copied from CRM [id number]. Topic: Clinical - Red Word Triage >> May 03, 2024 10:13 AM Diannia H wrote: Red Word that prompted transfer to Nurse Triage: Patient was in the ER on 09/11. She stated that she was feeling really dizzy and the back of the legs were hurting really bad. They gave her some medicine and she is needing something different that her insurance would cover and she is also needing to be seen for a hospital f/u. She stated she is feeling really bad.

## 2024-05-03 NOTE — Telephone Encounter (Signed)
 Call from patient stated all the prescriptions that she was given at the ER at [facility] are not covered by her Insurance .  Wants to know if something else can be ordered for her. She is currently at home and still would like a call from Dr. Trudy. Tessalon  Peerles,  Oxymetazoline , Robitussin DM .

## 2024-05-03 NOTE — ED Triage Notes (Signed)
 Pt BIB [facility] from home. Pt c/o SOB and chest congestions. [facility] and tested [facility] for [facility]. Upon ems arrival pt increasingly weak and unable to walk. 140/70 152CBG 80HR 20RR

## 2024-05-03 NOTE — Telephone Encounter (Signed)
 Copied from CRM #[id number]. Topic: Clinical - Medical Advice >> May 03, 2024 12:45 PM Suzette B wrote: Reason for CRM: benzonatate  (TESSALON ) 100 MG capsule, patient wanted to let Ms. Kenneth know this is the medication she forgot to inform her: please call patient to speak with her in regards medication

## 2024-05-03 NOTE — ED Provider Notes (Signed)
  EMERGENCY DEPARTMENT AT [facility] Provider Note   CSN: [id number] Arrival date & time: 05/03/24  9343     Patient presents with: Shortness of Breath   [name] is a 73 y.o. female.   Patient presents with weakness, congestion, cough after being diagnosed with COVID yesterday.  Patient presented to the ED at that time but left prior to being seen due to long wait times.  CBC and CMP were unrevealing, viral respiratory panel positive for COVID, CXR normal.  Patient states she has been drinking chicken broth and eating applesauce at home.  States she was weak and falling at home this morning.  Also reports chest pain from coughing.  Has been taking Tylenol  for body aches.  Was told she had a fever per EMS today.  She took morning medicines today, did not take home potassium.  The history is provided by the patient.  Shortness of Breath      Prior to Admission medications   Medication Sig Start Date End Date Taking? Authorizing Provider  alendronate  (FOSAMAX ) 70 MG tablet Take 1 tablet (70 mg total) by mouth every 7 (seven) days. Take with a full glass of water  on an empty stomach. 12/27/23 12/26/24  Trudy Mliss Dragon, MD  atorvastatin  (LIPITOR) 40 MG tablet Take 1 tablet (40 mg total) by mouth daily. 06/04/23 06/03/24  Trudy Mliss Dragon, MD  budesonide -formoterol  (SYMBICORT ) 80-4.5 MCG/ACT inhaler Inhale 2 puffs into the lungs 2 (two) times daily. 04/20/24   Elnora Ip, MD  cetirizine  (ZYRTEC ) 10 MG tablet Take 1 tablet (10 mg total) by mouth daily. 04/20/24   Elnora Ip, MD  fluticasone  (FLONASE ) 50 MCG/ACT nasal spray Place 1 spray into both nostrils daily. 04/20/24 04/20/25  Elnora Ip, MD  levalbuterol  (XOPENEX  HFA) 45 MCG/ACT inhaler Inhale 1 puff into the lungs every 6 (six) hours as needed for shortness of breath. 04/20/24   Elnora Ip, MD  linaclotide  (LINZESS ) 145 MCG CAPS capsule Take 1 capsule (145 mcg  total) by mouth daily before breakfast. 04/20/24 04/15/25  Elnora Ip, MD  losartan  (COZAAR ) 100 MG tablet TAKE 1 TABLET(100 MG) BY MOUTH DAILY 01/24/24   Trudy Mliss Dragon, MD  methocarbamol  (ROBAXIN ) 750 MG tablet Take 1 tablet (750 mg total) by mouth every 8 (eight) hours as needed for muscle spasms. 03/01/24   Rosan Dayton BROCKS, DO  metoprolol  succinate (TOPROL -XL) 50 MG 24 hr tablet Take 1 tablet (50 mg total) by mouth daily. Take with or immediately following a meal. 01/09/24   Trudy Mliss Dragon, MD  montelukast  (SINGULAIR ) 10 MG tablet Take 1 tablet (10 mg total) by mouth at bedtime. 01/11/24   Trudy Mliss Dragon, MD  omeprazole  (PRILOSEC) 40 MG capsule Take 1 capsule (40 mg total) by mouth daily. 02/23/24   Trudy Mliss Dragon, MD  polyethylene glycol (MIRALAX ) 17 g packet One dose twice a day for 2 days then once a day as needed. 04/05/24   Sofia, Leslie K, PA-C  potassium chloride  (KLOR-CON  M) 10 MEQ tablet Take 1 tablet (10 mEq total) by mouth daily. 01/03/24   Trudy Mliss Dragon, MD  spironolactone  (ALDACTONE ) 25 MG tablet Take 2 tablets (50 mg total) by mouth daily. For blood pressure. 11/23/23 11/22/24  Trudy Mliss Dragon, MD  tiotropium (SPIRIVA ) 18 MCG inhalation capsule Place 1 capsule (18 mcg total) into inhaler and inhale daily. 04/20/24   Elnora Ip, MD  triamcinolone  cream (KENALOG ) 0.1 % Apply a pea size amount over affected areas in  the lower legs. Patient taking differently: Apply 1 Application topically daily as needed (for affected ares lower legs). 01/10/24   Alexander-Savino, [place], MD  XARELTO  20 MG TABS tablet TAKE 1 TABLET(20 MG) BY MOUTH DAILY WITH SUPPER 04/11/24   Terra Fairy PARAS, PA-C    Allergies: Albuterol , Percocet [oxycodone -acetaminophen ], Augmentin  [amoxicillin -pot clavulanate], Celecoxib, and Tramadol     Review of Systems  Respiratory:  Positive for shortness of breath.     Updated Vital Signs BP 131/81   Pulse 71   Temp 97.6 F  (36.4 C)   Resp 17   SpO2 98%   Physical Exam Vitals reviewed.  Constitutional:      General: She is not in acute distress. HENT:     Head: Normocephalic and atraumatic.  Eyes:     Extraocular Movements: Extraocular movements intact.  Cardiovascular:     Rate and Rhythm: Normal rate and regular rhythm.  Pulmonary:     Effort: Pulmonary effort is normal. No tachypnea or respiratory distress.     Breath sounds: Normal breath sounds. No wheezing, rhonchi or rales.  Abdominal:     General: Bowel sounds are normal.     Palpations: Abdomen is soft.     Tenderness: There is no abdominal tenderness.  Musculoskeletal:     Cervical back: Normal range of motion.     Right lower leg: No edema.     Left lower leg: No edema.  Skin:    General: Skin is warm and dry.  Neurological:     General: No focal deficit present.     Mental Status: She is alert.  Psychiatric:        Mood and Affect: Mood normal.        Behavior: Behavior normal.     (all labs ordered are listed, but only abnormal results are displayed) Labs Reviewed - No data to display  EKG: None  Radiology: DG Chest 2 View Result Date: 05/02/2024 CLINICAL DATA:  73 year old female with chest pain and congestion. Dizziness. EXAM: CHEST - 2 VIEW COMPARISON:  Chest radiographs 04/05/2024 and earlier. FINDINGS: PA and lateral views 0808 hours. Lung volumes and heart size are stable at the upper limits of normal. Other mediastinal contours are within normal limits. Visualized tracheal air column is within normal limits. Both lungs appear clear. No pneumothorax or pleural effusion. No acute osseous abnormality identified. Negative visible bowel gas. IMPRESSION: No acute cardiopulmonary abnormality. Electronically Signed   By: VEAR Hurst M.D.   On: 05/02/2024 08:30     Procedures   Medications Ordered in the ED - No data to display  Clinical Course as of 05/03/24 0743  Thu May 03, 2024  0741 ECHO 1/19, LVEF 55-60%  [SG]     Clinical Course User Index [SG] Elnor Jayson LABOR, DO                                 Medical Decision Making 73 year old female with history of A-fib on Xarelto , asthma, and hypertension who presents with URI symptoms and weakness in the setting of a positive COVID test in the ED yesterday.  Differential includes COVID URI (most likely), pneumonia, ACS.  Most likely the chest pain is in the setting of frequent cough and EKG shows NSR. CXR remains clear making pneumonia much less likely. CBC/CMP with slight anemia to 11.1, otherwise WNL. Will manage symptoms with tylenol , fluid bolus, tessalon .   On reevaluation, patient continues  to be hemodynamically stable and is agreeable to discharge home with instructions for symptomatic management with OTC meds.  Reviewed return precautions.  Amount and/or Complexity of Data Reviewed Labs: ordered. Radiology: ordered.  Risk OTC drugs. Prescription drug management.  g     Final diagnoses:  None    ED Discharge Orders     None          Adele Song, MD 05/03/24 0908    Elnor Jayson LABOR, DO 05/04/24 9184

## 2024-05-03 NOTE — Telephone Encounter (Signed)
 Patient is calling back to let Kenneth know that she spoke to the manager at the pharmacy and they will be able to fill all of her prescriptions that was sent in from the hospital.  Patient does not need for PCP to send anything in for her.  benzonatate  (TESSALON ) 100 MG capsule oxymetazoline  (AFRIN NASAL SPRAY) 0.05 % nasal spray ibuprofen  (ADVIL ) 600 MG tablet guaiFENesin -dextromethorphan  (ROBITUSSIN DM) 100-10 MG/5ML syrup acetaminophen  (TYLENOL ) 325 MG tablet

## 2024-05-03 NOTE — ED Notes (Signed)
 Assisted patient to restroom, collected urine sample and sent to lab.

## 2024-05-04 ENCOUNTER — Telehealth: Payer: Self-pay | Admitting: *Deleted

## 2024-05-04 NOTE — Telephone Encounter (Signed)
 RTC to patient will have a friend go to the Pharmacy to pick up her meds.  Copied from CRM #[id number]. Topic: Clinical - Medical Advice >> May 04, 2024  8:13 AM Merlynn A wrote: Reason for CRM: Patient called in requesting call from [facility]. Please contact patient at [phone]. >> May 04, 2024 10:09 AM Laurier C wrote: Patient is requesting a call back from [facility] to confirm who will be delivering her medication. Patient states she contacted [facility] and was advised she would need to complete something on the website. Please give the patient a call back at [phone].

## 2024-05-08 ENCOUNTER — Telehealth: Payer: Self-pay | Admitting: *Deleted

## 2024-05-08 NOTE — Telephone Encounter (Signed)
 Call from patient was told to take Robitussin DM by the ER.  Patient remembers that she was told that due to her heart that she should not take the Robitussin DM.  Also was told that she could take Tussin CF wants to know if she can take that with her heart condition.  Feel as better.  Is taking plenty of fluids.  Wakes up with the sweats in the mornings now.  Would like to see if her doctor can call her with suggestions as to medications that she can take.  Currently is not coughing a lot.

## 2024-05-09 ENCOUNTER — Other Ambulatory Visit: Payer: Self-pay

## 2024-05-09 ENCOUNTER — Emergency Department (HOSPITAL_COMMUNITY)
Admission: EM | Admit: 2024-05-09 | Discharge: 2024-05-09 | Disposition: A | Discharge status: Home / Self Care | Attending: Emergency Medicine | Admitting: Emergency Medicine

## 2024-05-09 ENCOUNTER — Emergency Department (HOSPITAL_COMMUNITY)

## 2024-05-09 ENCOUNTER — Encounter (HOSPITAL_COMMUNITY): Payer: Self-pay

## 2024-05-09 DIAGNOSIS — R0602 Shortness of breath: Secondary | ICD-10-CM | POA: Diagnosis not present

## 2024-05-09 DIAGNOSIS — R069 Unspecified abnormalities of breathing: Secondary | ICD-10-CM | POA: Diagnosis not present

## 2024-05-09 DIAGNOSIS — Z79899 Other long term (current) drug therapy: Secondary | ICD-10-CM | POA: Insufficient documentation

## 2024-05-09 DIAGNOSIS — Z7901 Long term (current) use of anticoagulants: Secondary | ICD-10-CM | POA: Diagnosis not present

## 2024-05-09 DIAGNOSIS — J45909 Unspecified asthma, uncomplicated: Secondary | ICD-10-CM | POA: Diagnosis not present

## 2024-05-09 DIAGNOSIS — R531 Weakness: Secondary | ICD-10-CM | POA: Diagnosis not present

## 2024-05-09 DIAGNOSIS — I10 Essential (primary) hypertension: Secondary | ICD-10-CM | POA: Insufficient documentation

## 2024-05-09 DIAGNOSIS — R079 Chest pain, unspecified: Secondary | ICD-10-CM | POA: Insufficient documentation

## 2024-05-09 DIAGNOSIS — I482 Chronic atrial fibrillation, unspecified: Secondary | ICD-10-CM | POA: Diagnosis not present

## 2024-05-09 DIAGNOSIS — R0989 Other specified symptoms and signs involving the circulatory and respiratory systems: Secondary | ICD-10-CM | POA: Diagnosis not present

## 2024-05-09 DIAGNOSIS — I491 Atrial premature depolarization: Secondary | ICD-10-CM | POA: Diagnosis not present

## 2024-05-09 DIAGNOSIS — E860 Dehydration: Secondary | ICD-10-CM | POA: Diagnosis not present

## 2024-05-09 LAB — CBC WITH DIFFERENTIAL/PLATELET
Abs Immature Granulocytes: 0.02 K/uL (ref 0.00–0.07)
Basophils Absolute: 0 K/uL (ref 0.0–0.1)
Basophils Relative: 1 %
Eosinophils Absolute: 0.1 K/uL (ref 0.0–0.5)
Eosinophils Relative: 3 %
HCT: 37.6 % (ref 36.0–46.0)
Hemoglobin: 11.6 g/dL — ABNORMAL LOW (ref 12.0–15.0)
Immature Granulocytes: 1 %
Lymphocytes Relative: 31 %
Lymphs Abs: 1.4 K/uL (ref 0.7–4.0)
MCH: 26.4 pg (ref 26.0–34.0)
MCHC: 30.9 g/dL (ref 30.0–36.0)
MCV: 85.5 fL (ref 80.0–100.0)
Monocytes Absolute: 0.3 K/uL (ref 0.1–1.0)
Monocytes Relative: 8 %
Neutro Abs: 2.5 K/uL (ref 1.7–7.7)
Neutrophils Relative %: 56 %
Platelets: 256 K/uL (ref 150–400)
RBC: 4.4 MIL/uL (ref 3.87–5.11)
RDW: 15.3 % (ref 11.5–15.5)
WBC: 4.4 K/uL (ref 4.0–10.5)
nRBC: 0 % (ref 0.0–0.2)

## 2024-05-09 LAB — BASIC METABOLIC PANEL WITH GFR
Anion gap: 13 (ref 5–15)
BUN: 11 mg/dL (ref 8–23)
CO2: 20 mmol/L — ABNORMAL LOW (ref 22–32)
Calcium: 9.6 mg/dL (ref 8.9–10.3)
Chloride: 112 mmol/L — ABNORMAL HIGH (ref 98–111)
Creatinine, Ser: 0.7 mg/dL (ref 0.44–1.00)
GFR, Estimated: >60 mL/min (ref >60)
Glucose, Bld: 95 mg/dL (ref 70–99)
Potassium: 3.6 mmol/L (ref 3.5–5.1)
Sodium: 145 mmol/L (ref 135–145)

## 2024-05-09 LAB — TROPONIN T, HIGH SENSITIVITY
Troponin T High Sensitivity: <15 ng/L (ref 0–19)
Troponin T High Sensitivity: <15 ng/L (ref 0–19)

## 2024-05-09 MED ORDER — SODIUM CHLORIDE 0.9 % IV BOLUS
1000.0000 mL | Freq: Once | INTRAVENOUS | Status: AC
  Administered 2024-05-09: 1000 mL via INTRAVENOUS

## 2024-05-09 NOTE — ED Triage Notes (Signed)
 Pt bib EMS from home for increased SOB/weakness/congestion since being diagnosed with Covid on the 11th. Significant weakness noted on arrival. No acute respiratory distress.

## 2024-05-09 NOTE — ED Provider Notes (Signed)
 [place] EMERGENCY DEPARTMENT AT [facility] Provider Note   CSN: [id number] Arrival date & time: 05/09/24  9367     Patient presents with: Shortness of Breath and Weakness   [name] is a 73 y.o. female.  73 year old female presents to ED with complaints of chest pain weakness and shortness of breath x 1 day.  Patient reports sinus congestion since onset of COVID. Patient has associated symptoms of chills.  Patient reports he woke up this morning and had a very brief episode of shortness of breath with some chest pain that went up to her left neck and back. Patient reports she was seen in the hospital on 9/10 and 9/11 and diagnosed with COVID.  Patient reports she has been taking Tylenol  at home with some mild relief.  Patient advises that she was told to take Robitussin DM and reports the medication does not agree with her.  Patient denies taking any other medication for relief and denies any shortness of breath at this time.    Prior to Admission medications   Medication Sig Start Date End Date Taking? Authorizing Provider  acetaminophen  (TYLENOL ) 325 MG tablet Take 2 tablets (650 mg total) by mouth every 6 (six) hours as needed. 05/03/24   Elnor Jayson LABOR, DO  alendronate  (FOSAMAX ) 70 MG tablet Take 1 tablet (70 mg total) by mouth every 7 (seven) days. Take with a full glass of water  on an empty stomach. 12/27/23 12/26/24  Trudy Mliss Dragon, MD  atorvastatin  (LIPITOR) 40 MG tablet Take 1 tablet (40 mg total) by mouth daily. 06/04/23 06/03/24  Trudy Mliss Dragon, MD  benzonatate  (TESSALON ) 100 MG capsule Take 1 capsule (100 mg total) by mouth every 8 (eight) hours. 05/03/24   Adele Song, MD  budesonide -formoterol  (SYMBICORT ) 80-4.5 MCG/ACT inhaler Inhale 2 puffs into the lungs 2 (two) times daily. 04/20/24   Elnora Ip, MD  cetirizine  (ZYRTEC ) 10 MG tablet Take 1 tablet (10 mg total) by mouth daily. 04/20/24   Elnora Ip, MD  fluticasone  (FLONASE )  50 MCG/ACT nasal spray Place 1 spray into both nostrils daily. 04/20/24 04/20/25  Elnora Ip, MD  guaiFENesin -dextromethorphan  (ROBITUSSIN DM) 100-10 MG/5ML syrup Take 5 mLs by mouth every 4 (four) hours as needed for cough. 05/03/24   Elnor Jayson LABOR, DO  ibuprofen  (ADVIL ) 600 MG tablet Take 1 tablet (600 mg total) by mouth every 6 (six) hours as needed. 05/03/24   Elnor Jayson LABOR, DO  levalbuterol  (XOPENEX  HFA) 45 MCG/ACT inhaler Inhale 1 puff into the lungs every 6 (six) hours as needed for shortness of breath. 04/20/24   Elnora Ip, MD  linaclotide  (LINZESS ) 145 MCG CAPS capsule Take 1 capsule (145 mcg total) by mouth daily before breakfast. 04/20/24 04/15/25  Elnora Ip, MD  losartan  (COZAAR ) 100 MG tablet TAKE 1 TABLET(100 MG) BY MOUTH DAILY 01/24/24   Trudy Mliss Dragon, MD  methocarbamol  (ROBAXIN ) 750 MG tablet Take 1 tablet (750 mg total) by mouth every 8 (eight) hours as needed for muscle spasms. 03/01/24   Rosan Dayton BROCKS, DO  metoprolol  succinate (TOPROL -XL) 50 MG 24 hr tablet Take 1 tablet (50 mg total) by mouth daily. Take with or immediately following a meal. 01/09/24   Trudy Mliss Dragon, MD  montelukast  (SINGULAIR ) 10 MG tablet Take 1 tablet (10 mg total) by mouth at bedtime. 01/11/24   Trudy Mliss Dragon, MD  omeprazole  (PRILOSEC) 40 MG capsule Take 1 capsule (40 mg total) by mouth daily. 02/23/24   Trudy Mliss Dragon,  MD  polyethylene glycol (MIRALAX ) 17 g packet One dose twice a day for 2 days then once a day as needed. 04/05/24   Sofia, Leslie K, PA-C  potassium chloride  (KLOR-CON  M) 10 MEQ tablet Take 1 tablet (10 mEq total) by mouth daily. 01/03/24   Trudy Mliss Dragon, MD  spironolactone  (ALDACTONE ) 25 MG tablet Take 2 tablets (50 mg total) by mouth daily. For blood pressure. 11/23/23 11/22/24  Trudy Mliss Dragon, MD  tiotropium (SPIRIVA ) 18 MCG inhalation capsule Place 1 capsule (18 mcg total) into inhaler and inhale daily. 04/20/24   Elnora Ip, MD  triamcinolone  cream (KENALOG ) 0.1 % Apply a pea size amount over affected areas in the lower legs. Patient taking differently: Apply 1 Application topically daily as needed (for affected ares lower legs). 01/10/24   Alexander-Savino, [place], MD  XARELTO  20 MG TABS tablet TAKE 1 TABLET(20 MG) BY MOUTH DAILY WITH SUPPER 04/11/24   Terra Fairy PARAS, PA-C    Allergies: Albuterol , Percocet [oxycodone -acetaminophen ], Augmentin  [amoxicillin -pot clavulanate], Celecoxib, and Tramadol     Review of Systems  Constitutional:  Positive for chills.  Respiratory:  Positive for chest tightness and shortness of breath.   Neurological:  Positive for weakness.  All other systems reviewed and are negative.   Updated Vital Signs BP (!) 176/108   Pulse 68   Temp 98.1 F (36.7 C) (Oral)   Resp 17   Ht 5' 7 (1.702 m)   Wt 61.2 kg   SpO2 100%   BMI 21.14 kg/m   Physical Exam Vitals and nursing note reviewed.  Constitutional:      Appearance: Normal appearance. She is well-developed.  HENT:     Head: Normocephalic and atraumatic.     Nose: Nose normal.  Eyes:     Extraocular Movements: Extraocular movements intact.     Conjunctiva/sclera: Conjunctivae normal.     Pupils: Pupils are equal, round, and reactive to light.  Cardiovascular:     Rate and Rhythm: Normal rate.  Pulmonary:     Effort: Pulmonary effort is normal. No respiratory distress.     Breath sounds: Normal breath sounds.  Abdominal:     General: Bowel sounds are normal.     Palpations: Abdomen is soft.     Tenderness: There is no abdominal tenderness.  Musculoskeletal:        General: Normal range of motion.     Cervical back: Normal range of motion.  Skin:    General: Skin is warm and dry.     Capillary Refill: Capillary refill takes less than 2 seconds.  Neurological:     General: No focal deficit present.     Mental Status: She is alert.  Psychiatric:        Mood and Affect: Mood normal.        Behavior:  Behavior normal.     (all labs ordered are listed, but only abnormal results are displayed) Labs Reviewed  CBC WITH DIFFERENTIAL/PLATELET - Abnormal; Notable for the following components:      Result Value   Hemoglobin 11.6 (*)    All other components within normal limits  BASIC METABOLIC PANEL WITH GFR - Abnormal; Notable for the following components:   Chloride 112 (*)    CO2 20 (*)    All other components within normal limits  TROPONIN T, HIGH SENSITIVITY  TROPONIN T, HIGH SENSITIVITY    EKG: EKG Interpretation Date/Time:  Wednesday May 09 2024 06:48:24 EDT Ventricular Rate:  66 PR Interval:  123  QRS Duration:  86 QT Interval:  421 QTC Calculation: 442 R Axis:   73  Text Interpretation: Sinus rhythm RSR' in V1 or V2, probably normal variant No significant change since last tracing Confirmed by Doretha Folks ([id number]) on 05/09/2024 7:12:15 AM  Radiology: [facility] Chest 2 View Result Date: 05/09/2024 CLINICAL DATA:  73 year old female with shortness of breath, increasing congestion and weakness following recent diagnosis of COVID-19. EXAM: CHEST - 2 VIEW COMPARISON:  Portable chest 05/03/2024 and earlier. FINDINGS: AP and lateral views of the chest 0806 hours. Lung volumes and heart size are stable at the upper limits of normal to mildly increased. Stable cardiac size and mediastinal contours. Visualized tracheal air column is within normal limits. No pneumothorax, pulmonary edema, pleural effusion or confluent lung opacity. No acute osseous abnormality identified. Negative visible bowel gas. IMPRESSION: No acute cardiopulmonary abnormality. Electronically Signed   By: VEAR Hurst M.D.   On: 05/09/2024 08:32     Procedures   Medications Ordered in the ED  sodium chloride  0.9 % bolus 1,000 mL (0 mLs Intravenous Stopped 05/09/24 1148)   73 y.o. female presents to the ED with complaints of shortness of breath and chest pain, this involves an extensive number of treatment options,  and is a complaint that carries with it a high risk of complications and morbidity.  The differential diagnosis includes COVID, URI, pneumonia, ACS, aortic dissection, pulmonary edema, (Ddx)  On arrival pt is nontoxic, vitals hypertensive.. Exam significant for weakness on arrival, nurse advised patient needed assistance moving from wheelchair to ED bed.  Additional history obtained from chart review including positive COVID test, and PCP follow-ups for asthma with self reporting marijuana use.  Patient has been given formoterol  twice daily and levalbuterol  for asthma.  Patient given metoprolol  and spironolactone  for blood pressure.  And A-fib managed with Xarelto .  Lab Tests:  I Ordered, reviewed, and interpreted labs, which included: BMP, CBC, troponin  Imaging Studies ordered:  I ordered imaging studies which included chest x-ray, I independently visualized and interpreted imaging which showed no acute abnormality.   ED Course:   73 year old female with history of A-fib on Xarelto , asthma, and hypertension who presents with URI symptoms and weakness in the setting of a positive COVID test in the ED. patient was last seen in the ED on the 11th, cardiac and respiratory etiologies were ruled out.  Patient reports continued congestion and weakness since discharge.  Patient reports she had an episode of shortness of breath and chest pain today when she woke up.  The chest pain radiated to her neck and back and then resolved.  Patient reports she has also been waking up with sweats in the middle of the night.  Patient denies any nausea, vomiting, diarrhea, cough.  On exam patient sitting comfortably in ED bed but nurse notifies she is very weak moving from wheelchair to ED bed.  Patient has clear lungs auscultation, no noted wheezing or rhonchi.  No signs of edema in lower extremities.  Ear canals are clear with no pain to manipulation of tragus and no TM abnormality.  Throat is not erythematous with no  exudates.  Abdomen is soft and nontender.  No visual rashes on back or chest.  Initial troponin was negative.  Chest x-ray was negative for acute process.  Will wait on second troponin.  Patient reports chest pain was this morning when she woke up and secondary to shortness of breath.  Patient advises it was relieved shortly after and has  not been reproducible since.  CBC, BMP unremarkable.  Upon reevaluation patient was sitting comfortably in ED bed reporting of no new symptoms. Delta troponin was negative and patient sitting comfortably in ED bed. Patient has no current complaints and has not had any episodes on [facility] in ED today. Patient was advised OTC medications that can help with COVID symptoms including tylenol /ibuprofen  and saline nose sprays. Patient was advised on strict return precautions and was discharged.   Portions of this note were generated with Scientist, clinical (histocompatibility and immunogenetics). Dictation errors may occur despite best attempts at proofreading.    Final diagnoses:  Shortness of breath    ED Discharge Orders     None          Myriam Fonda GORMAN DEVONNA 05/10/24 9042    Doretha Folks, MD 05/12/24 2258

## 2024-05-09 NOTE — Discharge Instructions (Signed)
 All lab work and imaging was reassuring today.  It is recommended that you use saline nose spray to help with congestion.  Take Tylenol  and ibuprofen  as needed for pain.  Follow-up with your primary care if the symptoms continue.  If worsening symptoms occur or new symptoms arise return to ED for further evaluation.

## 2024-05-10 ENCOUNTER — Other Ambulatory Visit: Payer: Self-pay | Admitting: Internal Medicine

## 2024-05-10 DIAGNOSIS — J4520 Mild intermittent asthma, uncomplicated: Secondary | ICD-10-CM

## 2024-05-10 NOTE — Telephone Encounter (Unsigned)
 Copied from CRM [id number]. Topic: Clinical - Medication Refill >> May 10, 2024 11:59 AM Suzette B wrote: Medication: tiotropium (SPIRIVA ) 18 MCG inhalation capsule  Has the patient contacted their pharmacy? Yes she stated the pharmacy they something over yesterday and had not heard anything from the office as of yet   This is the patient's preferred pharmacy:  [id number] [place], [place] - [address] AT [facility] [address] [place] [address] Phone: [phone] Fax: [phone]   Is this the correct pharmacy for this prescription? Yes If no, delete pharmacy and type the correct one.   Has the prescription been filled recently? Yes  Is the patient out of the medication? No patient states has a few   Has the patient been seen for an appointment in the last year OR does the patient have an upcoming appointment? Yes  Can we respond through MyChart? No  Agent: Please be advised that Rx refills may take up to 3 business days. We ask that you follow-up with your pharmacy.

## 2024-05-11 NOTE — Telephone Encounter (Signed)
 RTC to patient on yesterday afternoon .  Left message that if needed Robitussin DM can be taken.

## 2024-05-14 ENCOUNTER — Telehealth: Payer: Self-pay | Admitting: *Deleted

## 2024-05-14 NOTE — Telephone Encounter (Unsigned)
 Copied from CRM [id number]. Topic: Clinical - Medical Advice >> May 09, 2024 10:11 AM Diannia H wrote: Reason for CRM: Patient wanted to inform the provider that she is back in the hospital because of her colon. She is at [employer]. She is not sure if the provider wants to admit her. Could you assist? Patients callback number is [phone]. She said one min she feels okay and the next min she's not, she had to call the ambulance around 6:30 this morning. She is also wanting to speak to [facility].

## 2024-05-18 ENCOUNTER — Emergency Department (HOSPITAL_COMMUNITY)

## 2024-05-18 ENCOUNTER — Emergency Department (HOSPITAL_COMMUNITY)
Admission: EM | Admit: 2024-05-18 | Discharge: 2024-05-18 | Disposition: A | Discharge status: Home / Self Care | Attending: Emergency Medicine | Admitting: Emergency Medicine

## 2024-05-18 DIAGNOSIS — I700 Atherosclerosis of aorta: Secondary | ICD-10-CM | POA: Diagnosis not present

## 2024-05-18 DIAGNOSIS — M25512 Pain in left shoulder: Secondary | ICD-10-CM | POA: Diagnosis not present

## 2024-05-18 DIAGNOSIS — I10 Essential (primary) hypertension: Secondary | ICD-10-CM | POA: Diagnosis not present

## 2024-05-18 DIAGNOSIS — M542 Cervicalgia: Secondary | ICD-10-CM | POA: Insufficient documentation

## 2024-05-18 DIAGNOSIS — I482 Chronic atrial fibrillation, unspecified: Secondary | ICD-10-CM | POA: Insufficient documentation

## 2024-05-18 DIAGNOSIS — Z87442 Personal history of urinary calculi: Secondary | ICD-10-CM | POA: Diagnosis not present

## 2024-05-18 DIAGNOSIS — Z79899 Other long term (current) drug therapy: Secondary | ICD-10-CM | POA: Diagnosis not present

## 2024-05-18 DIAGNOSIS — Z7901 Long term (current) use of anticoagulants: Secondary | ICD-10-CM | POA: Diagnosis not present

## 2024-05-18 DIAGNOSIS — M47812 Spondylosis without myelopathy or radiculopathy, cervical region: Secondary | ICD-10-CM | POA: Diagnosis not present

## 2024-05-18 DIAGNOSIS — S161XXD Strain of muscle, fascia and tendon at neck level, subsequent encounter: Secondary | ICD-10-CM

## 2024-05-18 DIAGNOSIS — M4802 Spinal stenosis, cervical region: Secondary | ICD-10-CM | POA: Diagnosis not present

## 2024-05-18 DIAGNOSIS — M25519 Pain in unspecified shoulder: Secondary | ICD-10-CM | POA: Diagnosis not present

## 2024-05-18 DIAGNOSIS — M25511 Pain in right shoulder: Secondary | ICD-10-CM | POA: Diagnosis not present

## 2024-05-18 MED ORDER — LIDOCAINE 5 % EX PTCH: 1.0000 | MEDICATED_PATCH | CUTANEOUS | 0 refills | Status: AC

## 2024-05-18 MED ORDER — METHOCARBAMOL 750 MG PO TABS: 750.0000 mg | ORAL_TABLET | Freq: Three times a day (TID) | ORAL | 0 refills | Status: AC | PRN

## 2024-05-18 NOTE — ED Triage Notes (Signed)
 C/o left shoulder pain and facial pain for the last few days. C/o CP. No acute distress noted.

## 2024-05-18 NOTE — ED Notes (Signed)
 Urine collected and sent to lab.

## 2024-05-18 NOTE — Discharge Instructions (Addendum)
 Take the medications as prescribed to help with the pain in your neck and shoulder.  Follow-up with your doctor next week to be rechecked if the symptoms have not resolved.  Return to the ED for fevers, chest pain, shortness of breath or other concerning symptoms

## 2024-05-18 NOTE — ED Provider Notes (Signed)
 [place] EMERGENCY DEPARTMENT AT [facility] Provider Note   CSN: [id number] Arrival date & time: 05/18/24  1013     Patient presents with: Shoulder Pain   [name] is a 73 y.o. female.    Shoulder Pain    Patient has a history of hypertension, acid reflux, kidney stones, atrial fibrillation splenic laceration related to a colonoscopy complication.  Patient states she started having pain in her shoulder and neck area the last couple days.  It increases with certain movements and palpitations.  She is not having any chest pain.  She does not have any shortness of breath.  No abdominal pain.  No fevers or chills.  No cough no vomiting or diarrhea  Prior to Admission medications   Medication Sig Start Date End Date Taking? Authorizing Provider  lidocaine  (LIDODERM ) 5 % Place 1 patch onto the skin daily. Remove & Discard patch within 12 hours or as directed by MD 05/18/24  Yes Randol Simmonds, MD  acetaminophen  (TYLENOL ) 325 MG tablet Take 2 tablets (650 mg total) by mouth every 6 (six) hours as needed. 05/03/24   Elnor Jayson LABOR, DO  alendronate  (FOSAMAX ) 70 MG tablet Take 1 tablet (70 mg total) by mouth every 7 (seven) days. Take with a full glass of water  on an empty stomach. 12/27/23 12/26/24  Trudy Mliss Dragon, MD  atorvastatin  (LIPITOR) 40 MG tablet Take 1 tablet (40 mg total) by mouth daily. 06/04/23 06/03/24  Trudy Mliss Dragon, MD  benzonatate  (TESSALON ) 100 MG capsule Take 1 capsule (100 mg total) by mouth every 8 (eight) hours. 05/03/24   Adele Song, MD  budesonide -formoterol  (SYMBICORT ) 80-4.5 MCG/ACT inhaler Inhale 2 puffs into the lungs 2 (two) times daily. 04/20/24   Elnora Ip, MD  cetirizine  (ZYRTEC ) 10 MG tablet Take 1 tablet (10 mg total) by mouth daily. 04/20/24   Elnora Ip, MD  fluticasone  (FLONASE ) 50 MCG/ACT nasal spray Place 1 spray into both nostrils daily. 04/20/24 04/20/25  Elnora Ip, MD  guaiFENesin -dextromethorphan   (ROBITUSSIN DM) 100-10 MG/5ML syrup Take 5 mLs by mouth every 4 (four) hours as needed for cough. 05/03/24   Elnor Jayson LABOR, DO  ibuprofen  (ADVIL ) 600 MG tablet Take 1 tablet (600 mg total) by mouth every 6 (six) hours as needed. 05/03/24   Elnor Jayson LABOR, DO  levalbuterol  (XOPENEX  HFA) 45 MCG/ACT inhaler Inhale 1 puff into the lungs every 6 (six) hours as needed for shortness of breath. 04/20/24   Elnora Ip, MD  linaclotide  (LINZESS ) 145 MCG CAPS capsule Take 1 capsule (145 mcg total) by mouth daily before breakfast. 04/20/24 04/15/25  Elnora Ip, MD  losartan  (COZAAR ) 100 MG tablet TAKE 1 TABLET(100 MG) BY MOUTH DAILY 01/24/24   Trudy Mliss Dragon, MD  methocarbamol  (ROBAXIN ) 750 MG tablet Take 1 tablet (750 mg total) by mouth every 8 (eight) hours as needed for muscle spasms. 05/18/24   Randol Simmonds, MD  metoprolol  succinate (TOPROL -XL) 50 MG 24 hr tablet Take 1 tablet (50 mg total) by mouth daily. Take with or immediately following a meal. 01/09/24   Trudy Mliss Dragon, MD  montelukast  (SINGULAIR ) 10 MG tablet Take 1 tablet (10 mg total) by mouth at bedtime. 01/11/24   Trudy Mliss Dragon, MD  omeprazole  (PRILOSEC) 40 MG capsule Take 1 capsule (40 mg total) by mouth daily. 02/23/24   Trudy Mliss Dragon, MD  polyethylene glycol (MIRALAX ) 17 g packet One dose twice a day for 2 days then once a day as needed. 04/05/24  Sofia, Leslie K, PA-C  potassium chloride  (KLOR-CON  M) 10 MEQ tablet Take 1 tablet (10 mEq total) by mouth daily. 01/03/24   Trudy Mliss Dragon, MD  spironolactone  (ALDACTONE ) 25 MG tablet Take 2 tablets (50 mg total) by mouth daily. For blood pressure. 11/23/23 11/22/24  Trudy Mliss Dragon, MD  tiotropium (SPIRIVA ) 18 MCG inhalation capsule Place 1 capsule (18 mcg total) into inhaler and inhale daily. 04/20/24   Elnora Ip, MD  triamcinolone  cream (KENALOG ) 0.1 % Apply a pea size amount over affected areas in the lower legs. Patient taking differently: Apply  1 Application topically daily as needed (for affected ares lower legs). 01/10/24   Alexander-Savino, [place], MD  XARELTO  20 MG TABS tablet TAKE 1 TABLET(20 MG) BY MOUTH DAILY WITH SUPPER 04/11/24   Terra Fairy PARAS, PA-C    Allergies: Albuterol , Percocet [oxycodone -acetaminophen ], Augmentin  [amoxicillin -pot clavulanate], Celecoxib, and Tramadol     Review of Systems  Updated Vital Signs BP (!) 164/82 (BP Location: Left Arm)   Pulse 68   Temp 98.2 F (36.8 C) (Oral)   Resp 18   SpO2 99%   Physical Exam Vitals and nursing note reviewed.  Constitutional:      General: She is not in acute distress.    Appearance: She is well-developed.  HENT:     Head: Normocephalic and atraumatic.     Right Ear: External ear normal.     Left Ear: External ear normal.  Eyes:     General: No scleral icterus.       Right eye: No discharge.        Left eye: No discharge.     Conjunctiva/sclera: Conjunctivae normal.  Neck:     Trachea: No tracheal deviation.  Cardiovascular:     Rate and Rhythm: Normal rate and regular rhythm.  Pulmonary:     Effort: Pulmonary effort is normal. No respiratory distress.     Breath sounds: Normal breath sounds. No stridor. No wheezing or rales.  Abdominal:     General: Bowel sounds are normal. There is no distension.     Palpations: Abdomen is soft.     Tenderness: There is no abdominal tenderness. There is no guarding or rebound.  Musculoskeletal:        General: No tenderness or deformity.     Cervical back: Neck supple.     Comments: No focal areas of swelling, mild discomfort with range of motion of left shoulder, mild tenderness palpation left paraspinal region  Skin:    General: Skin is warm and dry.     Findings: No rash.  Neurological:     General: No focal deficit present.     Mental Status: She is alert.     Cranial Nerves: No cranial nerve deficit, dysarthria or facial asymmetry.     Sensory: No sensory deficit.     Motor: No abnormal muscle tone  or seizure activity.     Coordination: Coordination normal.  Psychiatric:        Mood and Affect: Mood normal.     (all labs ordered are listed, but only abnormal results are displayed) Labs Reviewed - No data to display  EKG: None  Radiology: DG Chest 2 View Result Date: 05/18/2024 EXAM: 2 VIEW(S) XRAY OF THE CHEST 05/18/2024 11:38:00 AM COMPARISON: 05/09/2024 CLINICAL HISTORY: Shoulder and neck pain no trauma. Labs-1050; 1101 ILR; Neck and shoulder pain no trauma. FINDINGS: LUNGS AND PLEURA: No focal pulmonary opacity. No pulmonary edema. No pleural effusion. No pneumothorax. HEART AND MEDIASTINUM:  Calcified aorta. Overlapping cardiac leads. BONES AND SOFT TISSUES: No acute osseous abnormality. IMPRESSION: 1. Normal chest radiograph. No acute cardiopulmonary abnormality detected. Electronically signed by: Waddell Calk MD 05/18/2024 12:09 PM EDT RP Workstation: [id number]   DG Shoulder Left Result Date: 05/18/2024 EXAM: 1 VIEW XRAY OF THE LEFT SHOULDER 05/18/2024 11:38:00 AM COMPARISON: None available. CLINICAL HISTORY: Neck and shoulder pain no trauma. Labs-1050; 1101 ILR; Neck and shoulder pain no trauma. FINDINGS: BONES AND JOINTS: Glenohumeral joint is normally aligned. No acute fracture or dislocation. The [facility] joint is unremarkable in appearance. SOFT TISSUES: No abnormal calcifications. Visualized lung is unremarkable. IMPRESSION: 1. Normal radiographic examination of the shoulder without acute abnormality. Electronically signed by: Waddell Calk MD 05/18/2024 12:08 PM EDT RP Workstation: [id number]   DG Cervical Spine Complete Result Date: 05/18/2024 CLINICAL DATA:  Neck and right shoulder pain.  No known injury. EXAM: CERVICAL SPINE - COMPLETE 4+ VIEW COMPARISON:  CT cervical spine 05/09/2022. FINDINGS: The prevertebral soft tissues are normal. The alignment is anatomic through T1. There is no evidence of acute fracture or traumatic subluxation. The C1-2 articulation appears normal in  the AP projection. There is multilevel spondylosis with disc space narrowing, uncinate spurring and facet hypertrophy. Resulting multilevel mild-to-moderate foraminal narrowing, similar to previous CT. IMPRESSION: Multilevel cervical spondylosis with resulting foraminal narrowing, similar to previous CT. No acute osseous findings or malalignment. Electronically Signed   By: Elsie Perone M.D.   On: 05/18/2024 12:03     Procedures   Medications Ordered in the ED - No data to display  Clinical Course as of 05/18/24 1319  Fri May 18, 2024  1255 Chest x-ray negative [JK]  1255 Shoulder x-ray without acute abnormality [JK]  1255 C-spine CT does not show any acute abnormality, multilevel cervical spondylosis noted [JK]    Clinical Course User Index [JK] Randol Simmonds, MD                                 Medical Decision Making Problems Addressed: Acute pain of left shoulder: acute illness or injury that poses a threat to life or bodily functions  Amount and/or Complexity of Data Reviewed Radiology: ordered and independent interpretation performed.  Risk Prescription drug management.   Patient presents ED with complaints of shoulder and neck pain.  She is not having any chest pain or shortness of breath.  Doubt that her symptoms are referred in nature.  Patient's symptoms are possibly related to cervical radiculopathy.  Pain is not severe.  She did not require any medications here in the ED.  Will discharge home with lidocaine  patches muscle relaxants.  Discussed outpatient follow-up with PCP to recheck     Final diagnoses:  Acute pain of left shoulder    ED Discharge Orders          Ordered    lidocaine  (LIDODERM ) 5 %  Every 24 hours        05/18/24 1317    methocarbamol  (ROBAXIN ) 750 MG tablet  Every 8 hours PRN        05/18/24 1317               Randol Simmonds, MD 05/18/24 1319

## 2024-05-31 ENCOUNTER — Telehealth: Payer: Self-pay | Admitting: *Deleted

## 2024-05-31 NOTE — Telephone Encounter (Signed)
 Called pt and informed of Dr Trudy' response. Informed of good hand washing and wear a mask if needed. She stated thank you Dr Trudy.

## 2024-05-31 NOTE — Telephone Encounter (Signed)
 Copied from CRM [id number]. Topic: Clinical - Medical Advice >> May 31, 2024  8:09 AM Zane F wrote: Reason for CRM:    Patient called in stating that a close friend has the following virus and she wanted to know what she should take for the following virus: RHINO BIRYS.   Patient stated that she at this present time she has no symptoms but the hospital where her close friend was seen told her to make sure she received a prescription from her provider as a preventative measure because she has asthma and high blood pressure. The patient has been around her friend for 5 days.   Please call patient back to relay the prescription sent in to the patient's preferred pharmacy. Patient prefers to speak with Kenneth.  Preferred Pharmacy: [facility] #[id number] [address], [place] - [address] AT [facility] OF [address] [place], [place] [address] Phone: [phone]  Fax: [phone]   Callback Number: [phone]

## 2024-05-31 NOTE — Telephone Encounter (Signed)
 I called pt who stated she went to see her friend in the hospital; he has the Rhinovirus. Stated she does not want to get sick and wants to know if her doctor could prescribe medication or suggest  which OTC medication she can take. Stated she has a slight runny nose but no other symptoms.

## 2024-06-04 ENCOUNTER — Other Ambulatory Visit: Payer: Self-pay | Admitting: Internal Medicine

## 2024-06-04 DIAGNOSIS — Z1231 Encounter for screening mammogram for malignant neoplasm of breast: Secondary | ICD-10-CM

## 2024-06-06 ENCOUNTER — Encounter (HOSPITAL_COMMUNITY): Payer: Self-pay | Admitting: *Deleted

## 2024-06-06 ENCOUNTER — Emergency Department (HOSPITAL_COMMUNITY)
Admission: EM | Admit: 2024-06-06 | Discharge: 2024-06-06 | Disposition: A | Discharge status: Home / Self Care | Attending: Emergency Medicine | Admitting: Emergency Medicine

## 2024-06-06 ENCOUNTER — Other Ambulatory Visit: Payer: Self-pay

## 2024-06-06 DIAGNOSIS — Z79899 Other long term (current) drug therapy: Secondary | ICD-10-CM | POA: Insufficient documentation

## 2024-06-06 DIAGNOSIS — H6123 Impacted cerumen, bilateral: Secondary | ICD-10-CM | POA: Insufficient documentation

## 2024-06-06 DIAGNOSIS — I2510 Atherosclerotic heart disease of native coronary artery without angina pectoris: Secondary | ICD-10-CM | POA: Insufficient documentation

## 2024-06-06 DIAGNOSIS — Z7901 Long term (current) use of anticoagulants: Secondary | ICD-10-CM | POA: Diagnosis not present

## 2024-06-06 DIAGNOSIS — Z7951 Long term (current) use of inhaled steroids: Secondary | ICD-10-CM | POA: Diagnosis not present

## 2024-06-06 DIAGNOSIS — R0981 Nasal congestion: Secondary | ICD-10-CM | POA: Insufficient documentation

## 2024-06-06 DIAGNOSIS — R0602 Shortness of breath: Secondary | ICD-10-CM | POA: Diagnosis present

## 2024-06-06 DIAGNOSIS — J45909 Unspecified asthma, uncomplicated: Secondary | ICD-10-CM | POA: Diagnosis not present

## 2024-06-06 DIAGNOSIS — I10 Essential (primary) hypertension: Secondary | ICD-10-CM | POA: Insufficient documentation

## 2024-06-06 DIAGNOSIS — I4891 Unspecified atrial fibrillation: Secondary | ICD-10-CM | POA: Insufficient documentation

## 2024-06-06 HISTORY — DX: Atherosclerotic heart disease of native coronary artery without angina pectoris: I25.10

## 2024-06-06 LAB — RESP PANEL BY RT-PCR (RSV, FLU A&B, COVID)  RVPGX2
Influenza A by PCR: NEGATIVE
Influenza B by PCR: NEGATIVE
Resp Syncytial Virus by PCR: NEGATIVE
SARS Coronavirus 2 by RT PCR: NEGATIVE

## 2024-06-06 LAB — RESPIRATORY PANEL BY PCR

## 2024-06-06 MED ORDER — ACETAMINOPHEN 325 MG PO TABS
650.0000 mg | ORAL_TABLET | Freq: Once | ORAL | Status: AC
  Administered 2024-06-06: 650 mg via ORAL
  Filled 2024-06-06: qty 2

## 2024-06-06 NOTE — ED Notes (Signed)
 DC instructions reviewed with pt. Pt aware of medications to get from the pharmacy that are OTC. No other questions or concerns at this time. Ambulated out of ED steady gait

## 2024-06-06 NOTE — ED Triage Notes (Signed)
 PT would like to see if she has a virus.  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  Her symptoms include headache, general malaise, sob with ambulation.    Pt also c/o R sided neck pain that she has been treated for, but continues.

## 2024-06-06 NOTE — ED Provider Notes (Signed)
 [place] EMERGENCY DEPARTMENT AT [facility] Provider Note   CSN: [id number] Arrival date & time: 06/06/24  9095     Patient presents with: Neck Pain and Shortness of Breath   TATIANA COURTER is a 73 y.o. female.   Neck Pain Shortness of Breath Associated symptoms: neck pain    Patient is a 73 year old female to the ED today for concerns for wanting to be tested for rhinovirus due to having visited her friend who is in the hospital and got positively diagnosed with rhinovirus.  medical history of HTN, GERD, HLD, constipation, asthma, CAD, A-fib currently taking Xarelto , with no reported missed doses  Notes that she currently has some mild nasal congestion but has not had any chest pain, shortness of breath, cough, sore throat, abdominal pain, nausea, vomiting, dysuria, lower leg swelling..  Was concerned that she may have rhinovirus and wants to know if she should isolate.    Prior to Admission medications   Medication Sig Start Date End Date Taking? Authorizing Provider  acetaminophen  (TYLENOL ) 325 MG tablet Take 2 tablets (650 mg total) by mouth every 6 (six) hours as needed. 05/03/24   Elnor Jayson LABOR, DO  alendronate  (FOSAMAX ) 70 MG tablet Take 1 tablet (70 mg total) by mouth every 7 (seven) days. Take with a full glass of water  on an empty stomach. 12/27/23 12/26/24  Trudy Mliss Dragon, MD  atorvastatin  (LIPITOR) 40 MG tablet Take 1 tablet (40 mg total) by mouth daily. 06/04/23 06/03/24  Trudy Mliss Dragon, MD  benzonatate  (TESSALON ) 100 MG capsule Take 1 capsule (100 mg total) by mouth every 8 (eight) hours. 05/03/24   Adele Song, MD  budesonide -formoterol  (SYMBICORT ) 80-4.5 MCG/ACT inhaler Inhale 2 puffs into the lungs 2 (two) times daily. 04/20/24   Elnora Ip, MD  cetirizine  (ZYRTEC ) 10 MG tablet Take 1 tablet (10 mg total) by mouth daily. 04/20/24   Elnora Ip, MD  fluticasone  (FLONASE ) 50 MCG/ACT nasal spray Place 1 spray into both  nostrils daily. 04/20/24 04/20/25  Elnora Ip, MD  guaiFENesin -dextromethorphan  (ROBITUSSIN DM) 100-10 MG/5ML syrup Take 5 mLs by mouth every 4 (four) hours as needed for cough. 05/03/24   Elnor Jayson LABOR, DO  ibuprofen  (ADVIL ) 600 MG tablet Take 1 tablet (600 mg total) by mouth every 6 (six) hours as needed. 05/03/24   Elnor Jayson LABOR, DO  levalbuterol  (XOPENEX  HFA) 45 MCG/ACT inhaler Inhale 1 puff into the lungs every 6 (six) hours as needed for shortness of breath. 04/20/24   Elnora Ip, MD  lidocaine  (LIDODERM ) 5 % Place 1 patch onto the skin daily. Remove & Discard patch within 12 hours or as directed by MD 05/18/24   Randol Simmonds, MD  linaclotide  (LINZESS ) 145 MCG CAPS capsule Take 1 capsule (145 mcg total) by mouth daily before breakfast. 04/20/24 04/15/25  Elnora Ip, MD  losartan  (COZAAR ) 100 MG tablet TAKE 1 TABLET(100 MG) BY MOUTH DAILY 01/24/24   Trudy Mliss Dragon, MD  methocarbamol  (ROBAXIN ) 750 MG tablet Take 1 tablet (750 mg total) by mouth every 8 (eight) hours as needed for muscle spasms. 05/18/24   Randol Simmonds, MD  metoprolol  succinate (TOPROL -XL) 50 MG 24 hr tablet Take 1 tablet (50 mg total) by mouth daily. Take with or immediately following a meal. 01/09/24   Trudy Mliss Dragon, MD  montelukast  (SINGULAIR ) 10 MG tablet Take 1 tablet (10 mg total) by mouth at bedtime. 01/11/24   Trudy Mliss Dragon, MD  omeprazole  (PRILOSEC) 40 MG capsule Take 1 capsule (  40 mg total) by mouth daily. 02/23/24   Trudy Mliss Dragon, MD  polyethylene glycol (MIRALAX ) 17 g packet One dose twice a day for 2 days then once a day as needed. 04/05/24   Sofia, Leslie K, PA-C  potassium chloride  (KLOR-CON  M) 10 MEQ tablet Take 1 tablet (10 mEq total) by mouth daily. 01/03/24   Trudy Mliss Dragon, MD  spironolactone  (ALDACTONE ) 25 MG tablet Take 2 tablets (50 mg total) by mouth daily. For blood pressure. 11/23/23 11/22/24  Trudy Mliss Dragon, MD  tiotropium (SPIRIVA ) 18 MCG inhalation  capsule Place 1 capsule (18 mcg total) into inhaler and inhale daily. 04/20/24   Elnora Ip, MD  triamcinolone  cream (KENALOG ) 0.1 % Apply a pea size amount over affected areas in the lower legs. Patient taking differently: Apply 1 Application topically daily as needed (for affected ares lower legs). 01/10/24   Alexander-Savino, [place], MD  XARELTO  20 MG TABS tablet TAKE 1 TABLET(20 MG) BY MOUTH DAILY WITH SUPPER 04/11/24   Terra Fairy PARAS, PA-C    Allergies: Albuterol , Percocet [oxycodone -acetaminophen ], Augmentin  [amoxicillin -pot clavulanate], Celecoxib, and Tramadol     Review of Systems  HENT:  Positive for congestion.   Musculoskeletal:  Positive for neck pain.  All other systems reviewed and are negative.   Updated Vital Signs BP (!) 152/77   Pulse 60   Temp 98.2 F (36.8 C)   Resp (!) 22   Ht 5' 7 (1.702 m)   Wt 61.2 kg   SpO2 100%   BMI 21.14 kg/m   Physical Exam Vitals and nursing note reviewed.  Constitutional:      General: She is not in acute distress.    Appearance: Normal appearance. She is not ill-appearing or diaphoretic.  HENT:     Head: Normocephalic and atraumatic.     Right Ear: There is impacted cerumen.     Left Ear: There is impacted cerumen.     Nose: Congestion present.     Mouth/Throat:     Mouth: Mucous membranes are moist.     Pharynx: Oropharynx is clear. No oropharyngeal exudate or posterior oropharyngeal erythema.  Eyes:     General: No scleral icterus.       Right eye: No discharge.        Left eye: No discharge.     Extraocular Movements: Extraocular movements intact.     Conjunctiva/sclera: Conjunctivae normal.     Pupils: Pupils are equal, round, and reactive to light.  Cardiovascular:     Rate and Rhythm: Normal rate and regular rhythm.     Pulses: Normal pulses.     Heart sounds: Normal heart sounds. No murmur heard.    No friction rub. No gallop.  Pulmonary:     Effort: Pulmonary effort is normal. No respiratory  distress.     Breath sounds: No stridor. No wheezing, rhonchi or rales.  Chest:     Chest wall: No tenderness.  Abdominal:     General: Abdomen is flat. There is no distension.     Palpations: Abdomen is soft.     Tenderness: There is no abdominal tenderness. There is no right CVA tenderness, left CVA tenderness, guarding or rebound.  Musculoskeletal:        General: No swelling, deformity or signs of injury.     Cervical back: Normal range of motion. No rigidity.     Right lower leg: No edema.     Left lower leg: No edema.  Skin:    General: Skin  is warm and dry.     Findings: No bruising, erythema or lesion.  Neurological:     General: No focal deficit present.     Mental Status: She is alert and oriented to person, place, and time. Mental status is at baseline.     Sensory: No sensory deficit.     Motor: No weakness.  Psychiatric:        Mood and Affect: Mood normal.     (all labs ordered are listed, but only abnormal results are displayed) Labs Reviewed  RESP PANEL BY RT-PCR (RSV, FLU A&B, COVID)  [id number]  RESPIRATORY PANEL BY PCR    EKG: None  Radiology: No results found.  Procedures   Medications Ordered in the ED  acetaminophen  (TYLENOL ) tablet 650 mg (has no administration in time range)      Medical Decision Making This patient is a 73 year old female  who presents to the ED for concern of wishing to be tested for viral infections due to having recently visited from the hospital who was positive diagnosed with rhinovirus and experiencing some congestion.  Notably complaining of neck pain but says that it is resolved with muscle relaxers and is currently not having any pain at this time.  On physical exam, patient is in no acute distress, afebrile, alert and orient x 4, speaking in full sentences, nontachypneic, nontachycardic.  LCTAB, RRR, no murmur, no abdominal palpation, no lower leg edema.  Oropharynx is clear, TMs notably impacted by cerumen.  With exam  otherwise unremarkable.  Patient well-appearing.  With her coming in for only wanting testing for viral infections as well as not experiencing any other symptoms outside of congestion, despite having earlier said that she was mildly short of breath to nursing staff, clarified that she was not short of breath, believe patient is to be discharged this time with low suspicion for pneumonia, ACS, PE, effusions.  Consider chest x-ray imaging but do not believe is wanted at this time.   Will have her continue to follow-up with PCP and symptomatically manage at home.  Patient vital signs have remained stable throughout the course of patient's time in the ED. Low suspicion for any other emergent pathology at this time. I believe this patient is safe to be discharged. Provided strict return to ER precautions. Patient expressed agreement and understanding of plan. All questions were answered.   Differential diagnoses prior to evaluation: The emergent differential diagnosis includes, but is not limited to,  upper respiratory infection, lower respiratory infection, allergies, asthma, irritants, sinus/esophageal foreign body, interstitial lung disease . This is not an exhaustive differential.   Past Medical History / Co-morbidities / Social History: HTN, GERD, HLD, constipation, asthma, CAD, A-fib currently taking Xarelto   Additional history: Chart reviewed. Pertinent results include:   Last seen in the emergency department on 05/18/2024 for left-sided shoulder pain.  Lab Tests/Imaging studies: I personally interpreted labs/imaging and the pertinent results include:    Respiratory panel negative Extended respiratory panel negative    Medications: I ordered medication including Tylenol .  I have reviewed the patients home medicines and have made adjustments as needed.  Critical Interventions: None  Social Determinants of Health: Has good follow-up PCP  Disposition: After consideration of the  diagnostic results and the patients response to treatment, I feel that the patient would benefit from discharge and treatment as above.   emergency department workup does not suggest an emergent condition requiring admission or immediate intervention beyond what has been performed at this time. The  plan is: Follow-up with PCP, return to ED for new or worsening symptoms. The patient is safe for discharge and has been instructed to return immediately for worsening symptoms, change in symptoms or any other concerns.   Final diagnoses:  Nasal congestion    ED Discharge Orders     None          Rayma Hegg S, PA-C 06/06/24 1241    Patsey Lot, MD 06/07/24 [id number]

## 2024-06-06 NOTE — Discharge Instructions (Addendum)
 You are seen today for nasal congestion.  Recommend continue to follow-up with your PCP if symptoms persist longer than next week or 2.  In the meantime you can try saline rinses to help with relieving of the nasal congestion as well as using over-the-counter Mucinex .  Additionally be sure to drink plenty of fluids.  Return to the ED though if you need to have any new or worsening symptoms which include fever, worsening shortness of breath, chest pain, lethargy.

## 2024-06-14 ENCOUNTER — Ambulatory Visit (HOSPITAL_COMMUNITY): Admitting: Internal Medicine

## 2024-07-09 ENCOUNTER — Ambulatory Visit
Admission: RE | Admit: 2024-07-09 | Discharge: 2024-07-09 | Disposition: A | Discharge status: Home / Self Care | Source: Ambulatory Visit | Attending: Internal Medicine | Admitting: Internal Medicine

## 2024-07-09 DIAGNOSIS — Z1231 Encounter for screening mammogram for malignant neoplasm of breast: Secondary | ICD-10-CM

## 2024-07-10 ENCOUNTER — Other Ambulatory Visit: Payer: Self-pay

## 2024-07-10 DIAGNOSIS — I10 Essential (primary) hypertension: Secondary | ICD-10-CM

## 2024-07-10 MED ORDER — METOPROLOL SUCCINATE ER 50 MG PO TB24: 50.0000 mg | ORAL_TABLET | Freq: Every day | ORAL | 5 refills | Status: AC

## 2024-07-10 NOTE — Telephone Encounter (Signed)
 Medication sent to pharmacy

## 2024-07-17 ENCOUNTER — Other Ambulatory Visit: Payer: Self-pay

## 2024-07-17 DIAGNOSIS — E785 Hyperlipidemia, unspecified: Secondary | ICD-10-CM

## 2024-07-17 MED ORDER — ATORVASTATIN CALCIUM 40 MG PO TABS: 40.0000 mg | ORAL_TABLET | Freq: Every day | ORAL | 3 refills | Status: AC

## 2024-07-17 NOTE — Telephone Encounter (Signed)
 Medication sent to pharmacy

## 2024-08-27 ENCOUNTER — Other Ambulatory Visit: Payer: Self-pay | Admitting: Internal Medicine

## 2024-08-27 DIAGNOSIS — L300 Nummular dermatitis: Secondary | ICD-10-CM

## 2024-08-27 NOTE — Telephone Encounter (Unsigned)
 Copied from CRM #[id number]. Topic: Clinical - Medication Refill >> Aug 27, 2024  2:08 PM Susanna ORN wrote: Medication: triamcinolone  cream (KENALOG ) 0.1 %  Has the patient contacted their pharmacy? No (Agent: If no, request that the patient contact the pharmacy for the refill. If patient does not wish to contact the pharmacy document the reason why and proceed with request.) (Agent: If yes, when and what did the pharmacy advise?)  This is the patient's preferred pharmacy:  [facility] #[id number] [address], [place] - [address] AT [facility] OF [address] [place] [address] Phone: [phone] Fax: [phone]  Is this the correct pharmacy for this prescription? Yes If no, delete pharmacy and type the correct one.   Has the prescription been filled recently? No  Is the patient out of the medication? Yes  Has the patient been seen for an appointment in the last year OR does the patient have an upcoming appointment? Yes  Can we respond through MyChart? Yes  Agent: Please be advised that Rx refills may take up to 3 business days. We ask that you follow-up with your pharmacy.

## 2024-08-29 MED ORDER — TRIAMCINOLONE ACETONIDE 0.1 % EX CREA: TOPICAL_CREAM | CUTANEOUS | 1 refills | Status: AC

## 2024-09-05 ENCOUNTER — Telehealth: Payer: Self-pay | Admitting: Internal Medicine

## 2024-09-05 NOTE — Telephone Encounter (Signed)
 Copied from CRM [id number]. Topic: Clinical - Medication Refill >> Sep 05, 2024 12:34 PM Mercer PEDLAR wrote: Medication: diclofenac  Sodium (VOLTAREN  ARTHRITIS PAIN) 1 % GEL  Has the patient contacted their pharmacy? Yes (Agent: If no, request that the patient contact the pharmacy for the refill. If patient does not wish to contact the pharmacy document the reason why and proceed with request.) (Agent: If yes, when and what did the pharmacy advise?)  This is the patient's preferred pharmacy:  [facility] #[id number] [address], [place] - [address] AT [facility] OF [address] [place] [address] Phone: [phone] Fax: [phone]  Is this the correct pharmacy for this prescription? Yes If no, delete pharmacy and type the correct one.   Has the prescription been filled recently? No  Is the patient out of the medication? Yes  Has the patient been seen for an appointment in the last year OR does the patient have an upcoming appointment? Yes  Can we respond through MyChart? No  Agent: Please be advised that Rx refills may take up to 3 business days. We ask that you follow-up with your pharmacy.

## 2024-09-05 NOTE — Telephone Encounter (Signed)
 Unable to pend requested medication.  diclofenac  Sodium (VOLTAREN  ARTHRITIS PAIN) 1 % GEL

## 2024-09-07 ENCOUNTER — Other Ambulatory Visit: Payer: Self-pay | Admitting: Internal Medicine

## 2024-09-07 DIAGNOSIS — M150 Primary generalized (osteo)arthritis: Secondary | ICD-10-CM

## 2024-09-07 MED ORDER — DICLOFENAC SODIUM 1 % EX GEL: 2.0000 g | Freq: Three times a day (TID) | CUTANEOUS | 5 refills | Status: AC | PRN

## 2024-09-12 NOTE — Telephone Encounter (Signed)
 Done

## 2025-01-09 ENCOUNTER — Ambulatory Visit
